# Patient Record
Sex: Male | Born: 1955
Health system: Southern US, Community
[De-identification: ages and names within clinical notes are randomized; demographics above are authoritative.]

## PROBLEM LIST (undated history)

## (undated) DIAGNOSIS — R42 Dizziness and giddiness: Secondary | ICD-10-CM

## (undated) DIAGNOSIS — R55 Syncope and collapse: Secondary | ICD-10-CM

## (undated) DIAGNOSIS — I639 Cerebral infarction, unspecified: Secondary | ICD-10-CM

## (undated) DIAGNOSIS — I82409 Acute embolism and thrombosis of unspecified deep veins of unspecified lower extremity: Secondary | ICD-10-CM

## (undated) DIAGNOSIS — I4892 Unspecified atrial flutter: Secondary | ICD-10-CM

## (undated) DIAGNOSIS — I480 Paroxysmal atrial fibrillation: Secondary | ICD-10-CM

## (undated) HISTORY — DX: Acute embolism and thrombosis of unspecified deep veins of unspecified lower extremity: I82.409

## (undated) HISTORY — DX: Cerebral infarction, unspecified: I63.9

## (undated) HISTORY — PX: OTHER SURGICAL HISTORY: SHX169

## (undated) HISTORY — DX: Unspecified atrial flutter: I48.92

## (undated) HISTORY — DX: Paroxysmal atrial fibrillation: I48.0

## (undated) HISTORY — DX: Dizziness and giddiness: R42

## (undated) HISTORY — DX: Syncope and collapse: R55

## (undated) HISTORY — PX: WRIST SURGERY: SHX841

## (undated) HISTORY — PX: RETINAL DETACHMENT SURGERY: SHX105

---

## 2003-05-12 ENCOUNTER — Ambulatory Visit (HOSPITAL_BASED_OUTPATIENT_CLINIC_OR_DEPARTMENT_OTHER): Admission: RE | Admit: 2003-05-12 | Discharge: 2003-05-12 | Payer: Self-pay | Admitting: Plastic Surgery

## 2003-05-12 ENCOUNTER — Ambulatory Visit (HOSPITAL_COMMUNITY): Admission: RE | Admit: 2003-05-12 | Discharge: 2003-05-12 | Payer: Self-pay | Admitting: Plastic Surgery

## 2003-05-13 ENCOUNTER — Encounter (INDEPENDENT_AMBULATORY_CARE_PROVIDER_SITE_OTHER): Payer: Self-pay | Admitting: *Deleted

## 2006-11-17 ENCOUNTER — Ambulatory Visit: Payer: Self-pay | Admitting: Cardiovascular Disease

## 2007-11-09 ENCOUNTER — Encounter (INDEPENDENT_AMBULATORY_CARE_PROVIDER_SITE_OTHER): Payer: Self-pay | Admitting: Gastroenterology

## 2007-11-09 ENCOUNTER — Ambulatory Visit (HOSPITAL_COMMUNITY): Admission: RE | Admit: 2007-11-09 | Discharge: 2007-11-09 | Payer: Self-pay | Admitting: Gastroenterology

## 2008-05-10 ENCOUNTER — Encounter (INDEPENDENT_AMBULATORY_CARE_PROVIDER_SITE_OTHER): Payer: Self-pay | Admitting: Gastroenterology

## 2008-05-10 ENCOUNTER — Ambulatory Visit (HOSPITAL_COMMUNITY): Admission: RE | Admit: 2008-05-10 | Discharge: 2008-05-10 | Payer: Self-pay | Admitting: Gastroenterology

## 2009-02-04 ENCOUNTER — Emergency Department (HOSPITAL_COMMUNITY): Admission: EM | Admit: 2009-02-04 | Discharge: 2009-02-04 | Payer: Self-pay | Admitting: Emergency Medicine

## 2009-02-07 ENCOUNTER — Encounter (INDEPENDENT_AMBULATORY_CARE_PROVIDER_SITE_OTHER): Payer: Self-pay | Admitting: *Deleted

## 2010-04-24 ENCOUNTER — Ambulatory Visit (HOSPITAL_COMMUNITY): Admission: RE | Admit: 2010-04-24 | Discharge: 2010-04-24 | Payer: Self-pay | Admitting: Gastroenterology

## 2010-07-31 NOTE — Letter (Signed)
Summary: Recall Colonoscopy Date Change Letter  [facility]  [address] [place], [place] [address]   Phone: [phone]  Fax: [phone]      February 07, 2009 MRN: [id number]   ASHRAF MESTA [address] [facility] [address] [place], [place]  [address]   Dear Mr. Blais,   Previously you were recommended to have a repeat colonoscopy around this time. Your chart was recently reviewed by Dr. Judie Petit T. Pleas Koch, M.D. of [facility]. Follow up colonoscopy is now recommended in 03-01-2012. This revised recommendation is based on current, nationally recognized guidelines for colorectal cancer screening and polyp surveillance. These guidelines are endorsed by the American Cancer Society, [employer] on Colorectal Cancer as well as numerous other major medical organizations.  Please understand that our recommendation assumes that you do not have any new symptoms such as bleeding, a change in bowel habits, anemia, or significant abdominal discomfort. If you do have any concerning GI symptoms or want to discuss the guideline recommendations, please call to arrange an office visit at your earliest convenience. Otherwise we will keep you in our reminder system and contact you 1-2 months prior to the date listed above to schedule your next colonoscopy.  Thank you,  Judie Petit T. Pleas Koch, M.D.  [facility] [phone]

## 2010-11-13 ENCOUNTER — Other Ambulatory Visit: Payer: Self-pay | Admitting: Podiatry

## 2010-11-13 NOTE — Op Note (Signed)
NAMEJAMICHEAL, Walter [name]               ACCOUNT NO.:  [id number]   MEDICAL RECORD NO.:  [id number]          PATIENT TYPE:  AMB   LOCATION:  ENDO                         FACILITY:  [facility]   PHYSICIAN:  Petra Kuba, M.D.    DATE OF BIRTH:  28-Nov-1955   DATE OF PROCEDURE:  11/09/2007  DATE OF DISCHARGE:                               OPERATIVE REPORT   PROCEDURE:  Colonoscopy and polypectomy.   INDICATION:  History of difficult to remove cecal polyp, want to re-  evaluate.  Consent was signed after risks, benefits, methods, and  options thoroughly discussed multiple times in the past.   MEDICINES USED:  1. Fentanyl 125 mcg.  2. Versed 12.5 mg.   PROCEDURE:  Rectal inspection is pertinent for external hemorrhoids  small.  Digital exam was negative.  The video colonoscope was inserted  and easily advanced to the level of the ileocecal valve.  We could see  the cecum at the distance to advance to the cecal pole required rolling  him on his back without any abdominal pressures.  We could not advance  to the cecum with abdominal pressure on his thigh.  On insertion some  left-sided primarily sigmoid diverticula were seen and a small ascending  polyp was also seen on insertion which was snared and hot biopsied at  the base on withdrawal.  The cecal polyp was seen.  The cecum was  identified by the appendiceal orifice and ileocecal valve.  The cecal  polyp was much smaller, but it was still residual.  It was semisessile.  We went ahead and proceeded with multiple snares using both the regular  and the mini snare, and multiple pieces were removed and suctioned  through the scope and collected in the trap.  The area was washed and  suctioned.  There seemed to be some slight residual areas around the  edge and multiple hot biopsies were done, the snares were done on a  setting of 15-150.  The initial hot biopsy was done at 15 and 150, but  with some increased using, we increased it to 20 and 200  for the hot  biopsies.  The ends of the possible residual polypoid areas were hot  biopsied.  All pieces were put in the same container.  After a prolonged  effort, there was no obvious residual polyp seen, and we elected not to  use the argon plasma coagulator due to no obvious residual polyp seen.  There was a tiny cecal polyp that was also cold biopsied x2, and another  small one which was snared, electrocautery applied, and this piece was  suctioned through the scope and collected in the trap.  There was also a  small ileocecal valve polyp which was snared and removed and put in the  same container as was the ascending polyp seen on the above.  After the  area was washed and watched, we elected to slowly withdraw.  No other  polypoid lesions were seen, as we slowly withdrew back to the rectum.  The left-sided diverticula primarily in the sigmoid were confirmed.  Once back  in the rectum, anorectal pull-through and retroflexion was  confirmed.  The scope was straightened, re-advanced short of the left  side of the colon.  Air was suctioned and scope removed.  The patient  tolerated the procedure well.  There was no obvious immediate  complication.   ENDOSCOPIC DIAGNOSES:  1. Internal and external small hemorrhoids.  2. Left-sided diverticula primarily in the sigmoid.  3. Moderate cecal residual polyp status post multiple snares and hot      biopsies as above.  4. Tiny cecal polyp cold biopsy, small cecum, ileocecal valve, and      ascending polyp all snared with additional hot biopsied based on      the ascending polyp.  5. Otherwise within normal limits to the cecum.   PLAN:  Await pathology.  No aspirin, nonsteroidals for 2 weeks, very  slowly advance his diet.  Follow-up p.r.n. and decide future colon  screening 6 months to a year to document complete removal based on  pathology.           ______________________________  Petra Kuba, M.D.     MEM/MEDQ  D:  11/09/2007   T:  11/10/2007  Job:  [id number]   cc:   L. Lupe Carney, M.D.

## 2010-11-13 NOTE — Op Note (Signed)
NAMECARMELLO, [name]               ACCOUNT NO.:  [id number]   MEDICAL RECORD NO.:  [id number]          PATIENT TYPE:  AMB   LOCATION:  ENDO                         FACILITY:  [facility]   PHYSICIAN:  Petra Kuba, M.D.    DATE OF BIRTH:  1955-12-31   DATE OF PROCEDURE:  05/10/2008  DATE OF DISCHARGE:                               OPERATIVE REPORT   PROCEDURE:  Colonoscopy with polypectomy.   INDICATIONS:  Patient with difficult-to-remove cecal polyp.  Want to re-  evaluate.   Consent was signed after risks, benefits, methods, and options  thoroughly discussed multiple times in the past.   MEDICINES USED:  Fentanyl 100 mcg, Versed 10 mg.   PROCEDURE:  Rectal inspection is pertinent for external hemorrhoids,  small.  Digital exam was negative.  The video colonoscope was inserted  and easily advanced around the colon to the cecum.  This did require  rolling him on his back and abdominal pressure.  Other than some left-  sided diverticula, no abnormalities were seen on insertion.  The cecum  was identified by the appendiceal orifice and the ileocecal valve.  We  did advance the scope through the ileocecal valve and then the terminal  ileum, which was normal.  Photo documentation was obtained.  The scope  was slowly withdrawn.  The prep was adequate.  There was some liquid  stool that required washing and suctioning.  The cecum was well  evaluated.  We could see the scar from the previous polypectomy site but  no residual polyp.  We went ahead and took a few cold biopsies in that  area and put it in the first container.  The scope was slowly withdrawn.  On the ileocecal valve, there was questionably a small polyp which was  cold-biopsied x2 and put in a second container.  The scope was slowly  withdrawn.  The risks of the ascending, transverse, and the majority of  the descending was normal.  We did have some left-sided, scattered,  small diverticula.  In the distal sigmoid, a small polyp  was seen, snare  electrocautery applied, and the polyp was removed, suctioned through the  scope and collected in the trap, put in the third container.  The scope  was withdrawn back to the rectum.  Anorectal pull-through and  retroflexion confirmed some small hemorrhoids.  The scope was  straightened and readvanced shortways up the left side of the colon.  Air was suctioned.  The scope removed.  The patient tolerated the  procedure well.  There was no obvious immediate complication.   ENDOSCOPIC DIAGNOSES:  1. Internal/external hemorrhoids.  2. Left-sided, primarily sigmoid diverticula.  3. Distal sigmoid small polyp, snared.  4. Questionable ileocecal valve polyp, cold-biopsied, small.  5. Residual scar but doubt any residual polyp in the cecum, status      post biopsy.  6. Otherwise within normal limits to the terminal ileum.   PLAN:  Await pathology but if there is no residual on the polyp scar,  probably repeat in 2-3 years.  Happy to see back p.r.n.  ______________________________  Petra Kuba, M.D.     MEM/MEDQ  D:  05/10/2008  T:  05/10/2008  Job:  [id number]   cc:   L. Lupe Carney, M.D.  Fax: [phone]

## 2010-11-13 NOTE — Assessment & Plan Note (Signed)
[facility]                            CARDIOLOGY OFFICE NOTE   Walter [name], Walter [name]                      MRN:          [id number]  DATE:11/17/2006                            DOB:          [birth year]    CHIEF COMPLAINT:  Patient here to establish care and would like  preventative cardiology evaluation.   HISTORY OF PRESENT ILLNESS:  Walter [name] is a very nice 55 year old  gentleman who presents for cardiac assessment. He is an active gentleman  with no history of cardiovascular disease. He exercises regularly,  currently 5-6 days weekly. He does vigorous exercise such as running and  weight lifting. He exercises for approximately 1 hour. He specifically  denies any exertional symptoms. He has no chest pain, dyspnea,  orthopnea, PND, edema, palpitations, light-headedness or syncope.  Overall he feels well and has no complaints today. He has no history of  cardiac problems and has never been hospitalized. Specifically, he has  not had hypertension, diabetes or dyslipidemia.   PAST MEDICAL HISTORY:  Pertinent for multiple orthopedic injuries. He  has had a torn Achilles on the left as well as multiple other injuries.  Other than orthopedic surgeries, he has had no other surgeries or  hospitalizations.   MEDICATIONS:  None.   ALLERGIES:  NKDA.   SOCIAL HISTORY:  The patient works in Clinical research associate. He is  married with 2 children. He does not smoke cigarettes or use illicit  drug. He drinks approximately 2-3 alcoholic beverages daily. He  exercises regularly as detailed above.   FAMILY HISTORY:  Pertinent for coronary artery disease in his father. He  just had a coronary stent placed at age 63. There is no history of  premature coronary artery disease in the family.   REVIEW OF SYSTEMS:  A complete 12-point review of systems was performed.  The only pertinent positive to report is that of seasonal allergies. All  other systems were  reviewed and are negative except as detailed above.   PHYSICAL EXAMINATION:  GENERAL:  The patient is alert and oriented. He  is in no acute distress.  VITAL SIGNS:  Weight 220 pounds, blood pressure 126/80 in the right arm,  127/77 in the left arm. Heart rate 71, height 6 feet, respirations 12.  HEENT:  Normal.  NECK:  Normal carotid upstrokes without bruits. Jugular venous pressure  is normal, no thyromegaly or thyroid nodules.  LUNGS:  Clear to auscultation bilaterally.  HEART:  PMI is discreet and nondisplaced. There is no RV heave or lift.  The heart is a regular rate and rhythm without murmurs or gallops.  ABDOMEN:  Soft, nontender, no organomegaly, no abdominal bruits.  EXTREMITIES:  No clubbing, cyanosis or edema. There are no femoral  artery bruits. Peripheral pulses are 2+ and equal throughout.  NEUROLOGIC:  Cranial nerves II-XII are intact. Strength is 5/5 and equal  in the arms and leg.  SKIN:  Warm and dry.   EKG shows normal sinus rhythm and is within normal limits. The  ventricular rate is 71 beats per minute.   ASSESSMENT:  Mr. Mansouri  is a 55 year old gentleman with no evidence of  cardiovascular disease. He has no traditional cardiac risk factors and  he has a normal EKG. I have reviewed his lipid panel which was done less  than one year ago which shows a total cholesterol of 192 and HDL  cholesterol of 57 and a triglyceride level of 97. His cholesterol/HDL  ratio is 3.4. He is really at very low risk of any cardiovascular  problems at this point. His cholesterol and blood pressure are both in  the ideal range. He has no family history of premature coronary artery  disease and diabetes is not present. I encouraged him to continue his  excellent exercise regimen with no other recommendations regarding  medication or other therapies. He does not require any screening at this  point. I would like to see him back in a few years just to make sure  that his blood pressure  is still under optimal control and his lipids  are still in line. If he develops any problems, I would be happy to see  him in the future as needed.     Veverly Fells. Excell Seltzer, MD  Electronically Signed    MDC/MedQ  DD: 11/17/2006  DT: 11/18/2006  Job #: [id number]   cc:   L. Lupe Carney, M.D.

## 2010-11-16 NOTE — Op Note (Signed)
   NAMEZAYAAN, KOZAK                         ACCOUNT NO.:  [id number]   MEDICAL RECORD NO.:  [id number]                   PATIENT TYPE:  AMB   LOCATION:  DSC                                  FACILITY:  [facility]   PHYSICIAN:  Alfredia Ferguson, M.D.               DATE OF BIRTH:  12/05/1955   DATE OF PROCEDURE:  05/12/2003  DATE OF DISCHARGE:                                 OPERATIVE REPORT   PREOPERATIVE DIAGNOSIS:  6 mm hemangioma, mid upper lip, at wet-dry  vermilion border.   POSTOPERATIVE DIAGNOSIS:  6 mm hemangioma, mid upper lip, at wet-dry  vermilion border.   OPERATION PERFORMED:  Excision of hemangioma, mid upper lip.   SURGEON:  Alfredia Ferguson, M.D.   ANESTHESIA:  2% Xylocaine, 1:100,000 epinephrine.   INDICATIONS FOR SURGERY:  This is a 55 year old male with a slowly enlarging  blue subcutaneous nodule.  Examination reveals that it is a hemangioma.  The  patient wishes to have it removed.  He understands that he is trading what  he has for a permanent and potentially visible scar.  In spite of that, he  wishes to proceed.   DESCRIPTION OF SURGERY:  Elliptical skin mark was placed around the  hemangioma.  Local anesthesia was infiltrated.  The area was prepped with  Betadine.  After waiting approximately 10 minutes, a small elliptical  excision directly over the hemangioma was made.  The hemangioma was  visualized and dissected away from the surrounding tissue.  Specimen was  passed off for pathology.  The edges were closed with interrupted 5-0  chromic suture.  The patient tolerated the procedure well and was discharged  to home in satisfactory condition.                                               Alfredia Ferguson, M.D.    WBB/MEDQ  D:  05/12/2003  T:  05/13/2003  Job:  [id number]

## 2013-08-03 ENCOUNTER — Other Ambulatory Visit: Payer: Self-pay | Admitting: Gastroenterology

## 2013-09-25 ENCOUNTER — Inpatient Hospital Stay (HOSPITAL_COMMUNITY)
Admission: EM | Admit: 2013-09-25 | Discharge: 2013-09-27 | DRG: 310 | Disposition: A | Discharge status: Home / Self Care | Payer: BC Managed Care – PPO | Attending: Internal Medicine | Admitting: Internal Medicine

## 2013-09-25 ENCOUNTER — Emergency Department (HOSPITAL_COMMUNITY): Payer: BC Managed Care – PPO

## 2013-09-25 ENCOUNTER — Encounter (HOSPITAL_COMMUNITY): Payer: Self-pay | Admitting: Emergency Medicine

## 2013-09-25 DIAGNOSIS — I4892 Unspecified atrial flutter: Principal | ICD-10-CM | POA: Diagnosis present

## 2013-09-25 DIAGNOSIS — R000 Tachycardia, unspecified: Secondary | ICD-10-CM | POA: Diagnosis present

## 2013-09-25 DIAGNOSIS — I429 Cardiomyopathy, unspecified: Secondary | ICD-10-CM

## 2013-09-25 DIAGNOSIS — R42 Dizziness and giddiness: Secondary | ICD-10-CM

## 2013-09-25 DIAGNOSIS — I4891 Unspecified atrial fibrillation: Secondary | ICD-10-CM | POA: Diagnosis present

## 2013-09-25 DIAGNOSIS — I43 Cardiomyopathy in diseases classified elsewhere: Secondary | ICD-10-CM | POA: Diagnosis present

## 2013-09-25 DIAGNOSIS — R55 Syncope and collapse: Secondary | ICD-10-CM | POA: Diagnosis present

## 2013-09-25 HISTORY — DX: Syncope and collapse: R55

## 2013-09-25 HISTORY — DX: Dizziness and giddiness: R42

## 2013-09-25 LAB — PROTIME-INR
INR: 0.98 (ref 0.00–1.49)
Prothrombin Time: 12.8 seconds (ref 11.6–15.2)

## 2013-09-25 LAB — CBC WITH DIFFERENTIAL/PLATELET
Basophils Absolute: 0 10*3/uL (ref 0.0–0.1)
Basophils Relative: 0 % (ref 0–1)
Eosinophils Absolute: 0.1 10*3/uL (ref 0.0–0.7)
Eosinophils Relative: 2 % (ref 0–5)
HCT: 47.6 % (ref 39.0–52.0)
Hemoglobin: 17.3 g/dL — ABNORMAL HIGH (ref 13.0–17.0)
Lymphocytes Relative: 36 % (ref 12–46)
Lymphs Abs: 2 10*3/uL (ref 0.7–4.0)
MCH: 34 pg (ref 26.0–34.0)
MCHC: 36.3 g/dL — ABNORMAL HIGH (ref 30.0–36.0)
MCV: 93.5 fL (ref 78.0–100.0)
Monocytes Absolute: 0.4 10*3/uL (ref 0.1–1.0)
Monocytes Relative: 8 % (ref 3–12)
Neutro Abs: 3 10*3/uL (ref 1.7–7.7)
Neutrophils Relative %: 54 % (ref 43–77)
Platelets: 216 10*3/uL (ref 150–400)
RBC: 5.09 MIL/uL (ref 4.22–5.81)
RDW: 13 % (ref 11.5–15.5)
WBC: 5.5 10*3/uL (ref 4.0–10.5)

## 2013-09-25 LAB — TROPONIN I
Troponin I: <0.3 ng/mL (ref <0.30)
Troponin I: <0.3 ng/mL (ref <0.30)

## 2013-09-25 LAB — COMPREHENSIVE METABOLIC PANEL
ALT: 17 U/L (ref 0–53)
AST: 18 U/L (ref 0–37)
Albumin: 4.1 g/dL (ref 3.5–5.2)
Alkaline Phosphatase: 56 U/L (ref 39–117)
BUN: 17 mg/dL (ref 6–23)
CO2: 27 mEq/L (ref 19–32)
Calcium: 9.9 mg/dL (ref 8.4–10.5)
Chloride: 100 mEq/L (ref 96–112)
Creatinine, Ser: 1.09 mg/dL (ref 0.50–1.35)
GFR calc Af Amer: 85 mL/min — ABNORMAL LOW (ref >90)
GFR calc non Af Amer: 73 mL/min — ABNORMAL LOW (ref >90)
Glucose, Bld: 102 mg/dL — ABNORMAL HIGH (ref 70–99)
Potassium: 4.9 mEq/L (ref 3.7–5.3)
Sodium: 140 mEq/L (ref 137–147)
Total Bilirubin: 0.9 mg/dL (ref 0.3–1.2)
Total Protein: 7 g/dL (ref 6.0–8.3)

## 2013-09-25 LAB — APTT: aPTT: 29 seconds (ref 24–37)

## 2013-09-25 LAB — I-STAT TROPONIN, ED: Troponin i, poc: 0.01 ng/mL (ref 0.00–0.08)

## 2013-09-25 LAB — MAGNESIUM: Magnesium: 1.8 mg/dL (ref 1.5–2.5)

## 2013-09-25 MED ORDER — HEPARIN (PORCINE) IN NACL 100-0.45 UNIT/ML-% IJ SOLN
1600.0000 [IU]/h | INTRAMUSCULAR | Status: DC
  Administered 2013-09-25: 1450 [IU]/h via INTRAVENOUS
  Administered 2013-09-26 (×2): 1600 [IU]/h via INTRAVENOUS
  Filled 2013-09-25 (×5): qty 250

## 2013-09-25 MED ORDER — DILTIAZEM HCL 30 MG PO TABS
30.0000 mg | ORAL_TABLET | Freq: Once | ORAL | Status: AC
  Administered 2013-09-25: 30 mg via ORAL
  Filled 2013-09-25: qty 1

## 2013-09-25 MED ORDER — ASPIRIN EC 81 MG PO TBEC
81.0000 mg | DELAYED_RELEASE_TABLET | Freq: Every day | ORAL | Status: DC
  Administered 2013-09-26 – 2013-09-27 (×2): 81 mg via ORAL
  Filled 2013-09-25 (×3): qty 1

## 2013-09-25 MED ORDER — HEPARIN BOLUS VIA INFUSION
4000.0000 [IU] | Freq: Once | INTRAVENOUS | Status: AC
  Administered 2013-09-25: 4000 [IU] via INTRAVENOUS
  Filled 2013-09-25: qty 4000

## 2013-09-25 MED ORDER — ADULT MULTIVITAMIN W/MINERALS CH
1.0000 | ORAL_TABLET | Freq: Every day | ORAL | Status: DC
  Administered 2013-09-26 – 2013-09-27 (×2): 1 via ORAL
  Filled 2013-09-25 (×3): qty 1

## 2013-09-25 MED ORDER — DILTIAZEM HCL 30 MG PO TABS
30.0000 mg | ORAL_TABLET | Freq: Four times a day (QID) | ORAL | Status: DC
  Administered 2013-09-25 – 2013-09-26 (×3): 30 mg via ORAL
  Filled 2013-09-25 (×7): qty 1

## 2013-09-25 MED ORDER — DILTIAZEM HCL 30 MG PO TABS
30.0000 mg | ORAL_TABLET | Freq: Once | ORAL | Status: DC
  Filled 2013-09-25: qty 1

## 2013-09-25 MED ORDER — SODIUM CHLORIDE 0.9 % IV SOLN
INTRAVENOUS | Status: DC
  Administered 2013-09-25: 18:00:00 via INTRAVENOUS

## 2013-09-25 MED ORDER — SODIUM CHLORIDE 0.9 % IV BOLUS (SEPSIS)
1000.0000 mL | Freq: Once | INTRAVENOUS | Status: AC
  Administered 2013-09-25: 1000 mL via INTRAVENOUS

## 2013-09-25 NOTE — ED Notes (Addendum)
Per [facility], pt was at the gym and was on the treadmill started not feeling well and feeling SOB. Got off and checked his heart rate and was 195. Checked it on another machine and was the same. Friend cardiologist was there and listened to him and told him to go to the hospital. Pt went to [facility]. Pt in A[place].  [facility] gave him ASA.

## 2013-09-25 NOTE — ED Notes (Signed)
Phlebotomy at the bedside  

## 2013-09-25 NOTE — H&P (Signed)
Walter [name] is an 58 y.o. male.    Primary Cardiologist::  [facility]  DJS:HFWYOVZC,HYIF, MD  Chief Complaint: fast HR HPI:  58 year old WMM in usual good health until last pm he felt dizzy and everything got dark, he would have passed out if he had not been able to sit.  Then he felt well until today.  He was on treadmill and just no energy, ran 1 mile but on second mile he had to walk, he took pulse with machine and pulse 195.  He then walked in gym for a while, and tried another machine and again pulse up to 185.  Dr. Lia Foyer in [place] and listened to his heart and diagnosed atrial fib.  Pt then went to urgent care and was sent by EMS to [facility].  Here in a flutter with RVR at times.  He is found to be orthostatic.  1 liter of fluid given.  BP still drops with standing and HR increases.  Pt has been given po cardizem.  Pt without chest pain, no SOB.  Just fatigue.   Labs stable,  Troponin neg.  Inf ST appears elevated due to a flutter beats.     History reviewed. No pertinent past medical history.  Past Surgical History  Procedure Laterality Date  . Retinal detachment surgery    . Wrist surgery      History reviewed. No pertinent family history. Social History:  reports that he has never smoked. He does not have any smokeless tobacco history on file. He reports that he drinks alcohol. He reports that he does not use illicit drugs.  Allergies: No Known Allergies  OUTPATIENT MEDICATIONS: Multivitamin for over 50, packet Probiotic acidophilus Lamisil 250 mg just finished.   Results for orders placed during the hospital encounter of 09/25/13 (from the past 48 hour(s))  CBC WITH DIFFERENTIAL     Status: Abnormal   Collection Time    09/25/13 12:24 PM      Result Value Ref Range   WBC 5.5  4.0 - 10.5 K/uL   RBC 5.09  4.22 - 5.81 MIL/uL   Hemoglobin 17.3 (*) 13.0 - 17.0 g/dL   HCT 47.6  39.0 - 52.0 %   MCV 93.5  78.0 - 100.0 fL   MCH 34.0  26.0 - 34.0 pg   MCHC 36.3 (*)  30.0 - 36.0 g/dL   RDW 13.0  11.5 - 15.5 %   Platelets 216  150 - 400 K/uL   Neutrophils Relative % 54  43 - 77 %   Neutro Abs 3.0  1.7 - 7.7 K/uL   Lymphocytes Relative 36  12 - 46 %   Lymphs Abs 2.0  0.7 - 4.0 K/uL   Monocytes Relative 8  3 - 12 %   Monocytes Absolute 0.4  0.1 - 1.0 K/uL   Eosinophils Relative 2  0 - 5 %   Eosinophils Absolute 0.1  0.0 - 0.7 K/uL   Basophils Relative 0  0 - 1 %   Basophils Absolute 0.0  0.0 - 0.1 K/uL  COMPREHENSIVE METABOLIC PANEL     Status: Abnormal   Collection Time    09/25/13 12:24 PM      Result Value Ref Range   Sodium 140  137 - 147 mEq/L   Potassium 4.9  3.7 - 5.3 mEq/L   Chloride 100  96 - 112 mEq/L   CO2 27  19 - 32 mEq/L  Glucose, Bld 102 (*) 70 - 99 mg/dL   BUN 17  6 - 23 mg/dL   Creatinine, Ser 1.09  0.50 - 1.35 mg/dL   Calcium 9.9  8.4 - 10.5 mg/dL   Total Protein 7.0  6.0 - 8.3 g/dL   Albumin 4.1  3.5 - 5.2 g/dL   AST 18  0 - 37 U/L   ALT 17  0 - 53 U/L   Alkaline Phosphatase 56  39 - 117 U/L   Total Bilirubin 0.9  0.3 - 1.2 mg/dL   GFR calc non Af Amer 73 (*) >90 mL/min   GFR calc Af Amer 85 (*) >90 mL/min   Comment: (NOTE)     The eGFR has been calculated using the CKD EPI equation.     This calculation has not been validated in all clinical situations.     eGFR's persistently <90 mL/min signify possible Chronic Kidney     Disease.  Randolm Idol, ED     Status: None   Collection Time    09/25/13 12:27 PM      Result Value Ref Range   Troponin i, poc 0.01  0.00 - 0.08 ng/mL   Comment 3            Comment: Due to the release kinetics of cTnI,     a negative result within the first hours     of the onset of symptoms does not rule out     myocardial infarction with certainty.     If myocardial infarction is still suspected,     repeat the test at appropriate intervals.   Dg Chest Portable 1 View  09/25/2013   CLINICAL DATA:  Chest pain and tachycardia.  EXAM: PORTABLE CHEST - 1 VIEW  COMPARISON:  None.   FINDINGS: The heart size and mediastinal contours are within normal limits. There is no evidence of pulmonary edema, consolidation, pneumothorax, nodule or pleural fluid. The visualized skeletal structures are unremarkable.  IMPRESSION: No active disease.   Electronically Signed   By: Aletta Edouard M.D.   On: 09/25/2013 12:54    ROS: General:no colds or fevers, no weight changes Skin:no rashes or ulcers HEENT:no blurred vision, no congestion CV:see HPI PUL:see HPI GI:no diarrhea constipation or melena, no indigestion GU:no hematuria, no dysuria MS:no joint pain, no claudication Neuro:near syncope, + lightheadedness both last evening Endo:no diabetes, no thyroid disease   Blood pressure 101/85, pulse 123, temperature 98 F (36.7 C), temperature source Oral, resp. rate 23, SpO2 99.00%. PE: General:Pleasant affect, NAD Skin:Warm and dry, brisk capillary refill HEENT:normocephalic, sclera clear, mucus membranes moist Neck:supple, no JVD, no bruits  Heart:irreg irreg without murmur, gallup, rub or click Lungs:clear without rales, rhonchi, or wheezes WJX:BJYN, non tender, + BS, do not palpate liver spleen or masses Ext:no lower ext edema, 2+ pedal pulses, 2+ radial pulses Neuro:alert and oriented, MAE, follows commands, + facial symmetry    Assessment/Plan Principal Problem: 1.    Atrial flutter, rule out ischemic source check troponin, echo, IV heparin, plan TEE DCCV for Monday.  Stress test as outpt.  Active Problems: 2.   Near syncope    [facility] R Nurse Practitioner Certified [place] Pager [phone] or after 5pm or weekends call [phone] 09/25/2013, 2:23 PM

## 2013-09-25 NOTE — ED Notes (Signed)
Dr. Rennis Golden at the bedside.

## 2013-09-25 NOTE — ED Notes (Signed)
Vernona Rieger, cardiology NP at the bedside.

## 2013-09-25 NOTE — ED Notes (Signed)
Pt returns from xray, no issues at radiology placed back on monitor.

## 2013-09-25 NOTE — ED Provider Notes (Signed)
CSN: [id number]     Arrival date & time 09/25/13  1206 History   First MD Initiated Contact with Patient 09/25/13 1215     Chief Complaint  Patient presents with  . Atrial Fibrillation     (Consider location/radiation/quality/duration/timing/severity/associated sxs/prior Treatment) The history is provided by the patient.  Lynann BeaverSteven L Hovater is a 58 y.o. male otherwise healthy here with palpitations. He was working out on the treadmill today and suddenly noticed that his heart rate was in the 190s. He then rechecked it and is still elevated. Had some dizziness but no syncope or chest pain. No history of blood clots and no recent travel. He was told to go to urgent care and had EKG done that showed atrial flutter and was sent here for evaluation.    History reviewed. No pertinent past medical history. Past Surgical History  Procedure Laterality Date  . Retinal detachment surgery    . Wrist surgery     Family History  Problem Relation Age of Onset  . Heart disease Mother   . Heart disease Father   . Healthy Sister   . Healthy Brother    History  Substance Use Topics  . Smoking status: Never Smoker   . Smokeless tobacco: Never Used  . Alcohol Use: Yes     Comment: occas    Review of Systems  Cardiovascular: Positive for palpitations.  All other systems reviewed and are negative.      Allergies  Review of patient's allergies indicates no known allergies.  Home Medications   Current Outpatient Rx  Name  Route  Sig  Dispense  Refill  . Multiple Vitamin (MULTIVITAMIN WITH MINERALS) TABS tablet   Oral   Take 1 tablet by mouth daily. [employer] over 50 vitamin packet         . Probiotic Product (PROBIOTIC ACIDOPHILUS BEADS PO)   Oral   Take 1 capsule by mouth daily.         [name]. terbinafine (LAMISIL) 250 MG tablet   Oral   Take 250 mg by mouth daily. 7 day prescription          BP 101/85  Pulse 123  Temp(Src) 98 F (36.7 C) (Oral)  Resp 23  SpO2 99% Physical Exam   Nursing note and vitals reviewed. Constitutional: He is oriented to person, place, and time. He appears well-developed and well-nourished.  HENT:  Head: Normocephalic.  Mouth/Throat: Oropharynx is clear and moist.  Eyes: Conjunctivae and EOM are normal. Pupils are equal, round, and reactive to light.  Neck: Normal range of motion. Neck supple.  Cardiovascular: Normal rate and normal heart sounds.   Irregular   Pulmonary/Chest: Effort normal and breath sounds normal. No respiratory distress. He has no wheezes. He has no rales.  Abdominal: Soft. Bowel sounds are normal. He exhibits no distension. There is no tenderness. There is no rebound.  Musculoskeletal: Normal range of motion. He exhibits no edema and no tenderness.  Neurological: He is alert and oriented to person, place, and time. No cranial nerve deficit. Coordination normal.  Skin: Skin is warm and dry.  Psychiatric: He has a normal mood and affect. His behavior is normal. Judgment and thought content normal.    ED Course  Procedures (including critical care time) Labs Review Labs Reviewed  CBC WITH DIFFERENTIAL - Abnormal; Notable for the following:    Hemoglobin 17.3 (*)    MCHC 36.3 (*)    All other components within normal limits  COMPREHENSIVE METABOLIC PANEL -  Abnormal; Notable for the following:    Glucose, Bld 102 (*)    GFR calc non Af Amer 73 (*)    GFR calc Af Amer 85 (*)    All other components within normal limits  I-STAT TROPOININ, ED   Imaging Review Dg Chest Portable 1 View  09/25/2013   CLINICAL DATA:  Chest pain and tachycardia.  EXAM: PORTABLE CHEST - 1 VIEW  COMPARISON:  None.  FINDINGS: The heart size and mediastinal contours are within normal limits. There is no evidence of pulmonary edema, consolidation, pneumothorax, nodule or pleural fluid. The visualized skeletal structures are unremarkable.  IMPRESSION: No active disease.   Electronically Signed   By: Irish Lack M.D.   On: 09/25/2013 12:54      EKG Interpretation   Date/Time:  Saturday September 25 2013 12:16:36 EDT Ventricular Rate:  92 PR Interval:    QRS Duration: 89 QT Interval:  391 QTC Calculation: 484 R Axis:   10 Text Interpretation:  Atrial flutter with predominant 3:1 AV block ST  elevation, consider inferior injury Borderline prolonged QT interval No  previous ECGs available Confirmed by Vishwa Dais  MD, Bassem Bernasconi ([id number]) on 09/25/2013  12:27:27 PM      MDM   Final diagnoses:  None   JOHNANTHONY LAPITAN is a 58 y.o. male here with new onset aflutter. HR in the 90s. Not concerned for PE. No signs of CHF. CHAD2 score is 0. Will check labs, cxr. Will give cardizem. He doesn't need to be anticoagulated right now.   2:39 PM Patient is not tachy when sitting. However, when he stands up, he gets tachycardic and hypotensive. Given 2L NS, given cardizem PO. Cardiology will admit for possible cardioversion.     Richardean Canal, MD 09/25/13 1440

## 2013-09-25 NOTE — H&P (Signed)
Pt. Seen and examined. Agree with the NP/PA-C note as written.  Pleasant 58 yo male with no significant past medical history.  He was feeling unwell last night, somewhat dizzy and pre-syncopal.  He went for his morning workout today and was noting that his pulse was very rapid. He ran into Dr. Riley Kill at the gym who listened to him and noted he was out of rhythm.  He went to urgent care for an EKG and was sent to the ER for possible STEMI. EKG shows atrial flutter with variable response - J point elevation anteriorly. He denies any chest pain at all - just awareness of palpitations. He was given cardizem in the ER and is now rate-controlled. He (and wife) deny sleep apnea symptoms. No recent infection. Just finished course of Lamasil for toe fungus.  Impression: 1.  New onset atrial flutter 2.  Near syncope  Plan: 1. Admit for rate-control.  R/o ACS with troponin. 2. Use po cardizem for rate control (30 q6) - IV heparin for anticoagulation. 3. Exact onset is not clear, however, seems to be <48 hrs - he was near syncopal and would benefit from     echo - the TEE-guided approach is the best way to exclude cardiac thrombus. Would plan on this Monday     if he does not spontaneously convert by then. 4. Will likely need outpatient stress test if he rules-out. 5. ?if lamasil could have caused this - I'm not aware of literature on this. 6.  Probably recommend NOAC on discharge for 1-3 months, then could convert to aspirin based on      CHADSVASC score of 0.  Chrystie Nose, MD, [facility] Attending Cardiologist [facility]

## 2013-09-25 NOTE — Progress Notes (Signed)
ANTICOAGULATION CONSULT NOTE - Initial Consult  Pharmacy Consult for heparin Indication: atrial fibrillation  No Known Allergies  Patient Measurements:   Heparin Dosing Weight: 95  Vital Signs: Temp: 98 F (36.7 C) (03/28 1219) Temp src: Oral (03/28 1219) BP: 125/84 mmHg (03/28 1545) Pulse Rate: 81 (03/28 1545)  Labs:  Recent Labs  09/25/13 1224  HGB 17.3*  HCT 47.6  PLT 216  CREATININE 1.09    CrCl is unknown because there is no height on file for the current visit.   Medical History: History reviewed. No pertinent past medical history.  Medications:  Prescriptions prior to admission  Medication Sig Dispense Refill  . Multiple Vitamin (MULTIVITAMIN WITH MINERALS) TABS tablet Take 1 tablet by mouth daily. [employer] over 50 vitamin packet      . Probiotic Product (PROBIOTIC ACIDOPHILUS BEADS PO) Take 1 capsule by mouth daily.      [name] terbinafine (LAMISIL) 250 MG tablet Take 250 mg by mouth daily. 7 day prescription       Scheduled:  . aspirin EC  81 mg Oral Daily  . diltiazem  30 mg Oral 4 times per day  . multivitamin with minerals  1 tablet Oral Daily   Infusions:  . sodium chloride      Assessment: 58 yo who was admitted for new onset aflutter. IV heparin has been ordered for anticoagulation.   Goal of Therapy:  Heparin level 0.3-0.7 units/ml Monitor platelets by anticoagulation protocol: Yes   Plan:   Heparin bolus 4000 units x1 Heparin drip at 1450 units/hr F/u with 6 hr heparin level Daily level and CBC

## 2013-09-25 NOTE — ED Notes (Signed)
Dr Riley Kill, friend, at the bedside.

## 2013-09-26 LAB — HEPATIC FUNCTION PANEL
ALT: 16 U/L (ref 0–53)
AST: 17 U/L (ref 0–37)
Albumin: 3.6 g/dL (ref 3.5–5.2)
Alkaline Phosphatase: 45 U/L (ref 39–117)
Bilirubin, Direct: <0.2 mg/dL (ref 0.0–0.3)
Total Bilirubin: 1 mg/dL (ref 0.3–1.2)
Total Protein: 6 g/dL (ref 6.0–8.3)

## 2013-09-26 LAB — HEMOGLOBIN A1C
Hgb A1c MFr Bld: 5.7 % — ABNORMAL HIGH (ref <5.7)
Mean Plasma Glucose: 117 mg/dL — ABNORMAL HIGH (ref <117)

## 2013-09-26 LAB — CBC
HCT: 45.7 % (ref 39.0–52.0)
Hemoglobin: 16 g/dL (ref 13.0–17.0)
MCH: 33.1 pg (ref 26.0–34.0)
MCHC: 35 g/dL (ref 30.0–36.0)
MCV: 94.6 fL (ref 78.0–100.0)
Platelets: 197 10*3/uL (ref 150–400)
RBC: 4.83 MIL/uL (ref 4.22–5.81)
RDW: 13.2 % (ref 11.5–15.5)
WBC: 6.1 10*3/uL (ref 4.0–10.5)

## 2013-09-26 LAB — HEPARIN LEVEL (UNFRACTIONATED)
Heparin Unfractionated: 0.29 IU/mL — ABNORMAL LOW (ref 0.30–0.70)
Heparin Unfractionated: 0.38 IU/mL (ref 0.30–0.70)
Heparin Unfractionated: 0.41 IU/mL (ref 0.30–0.70)

## 2013-09-26 LAB — LIPID PANEL
Cholesterol: 169 mg/dL (ref 0–200)
HDL: 55 mg/dL (ref >39)
LDL Cholesterol: 83 mg/dL (ref 0–99)
Total CHOL/HDL Ratio: 3.1 RATIO
Triglycerides: 157 mg/dL — ABNORMAL HIGH (ref <150)
VLDL: 31 mg/dL (ref 0–40)

## 2013-09-26 LAB — BASIC METABOLIC PANEL
BUN: 13 mg/dL (ref 6–23)
CO2: 26 mEq/L (ref 19–32)
Calcium: 8.8 mg/dL (ref 8.4–10.5)
Chloride: 99 mEq/L (ref 96–112)
Creatinine, Ser: 1.1 mg/dL (ref 0.50–1.35)
GFR calc Af Amer: 84 mL/min — ABNORMAL LOW (ref >90)
GFR calc non Af Amer: 72 mL/min — ABNORMAL LOW (ref >90)
Glucose, Bld: 100 mg/dL — ABNORMAL HIGH (ref 70–99)
Potassium: 4.1 mEq/L (ref 3.7–5.3)
Sodium: 136 mEq/L — ABNORMAL LOW (ref 137–147)

## 2013-09-26 LAB — TROPONIN I: Troponin I: <0.3 ng/mL (ref <0.30)

## 2013-09-26 LAB — T4, FREE: Free T4: 1.09 ng/dL (ref 0.80–1.80)

## 2013-09-26 LAB — TSH: TSH: 2.921 u[IU]/mL (ref 0.350–4.500)

## 2013-09-26 MED ORDER — METOPROLOL SUCCINATE ER 25 MG PO TB24
25.0000 mg | ORAL_TABLET | Freq: Every day | ORAL | Status: DC
  Administered 2013-09-26: 25 mg via ORAL
  Filled 2013-09-26 (×2): qty 1

## 2013-09-26 MED ORDER — OFF THE BEAT BOOK
Freq: Once | Status: AC
  Administered 2013-09-26: 02:00:00
  Filled 2013-09-26: qty 1

## 2013-09-26 NOTE — Progress Notes (Addendum)
ANTICOAGULATION CONSULT NOTE - Follow Up Consult  Pharmacy Consult for heparin Indication: atrial flutter  No Known Allergies  Patient Measurements: Height: 5\' 11"  (180.3 cm) Weight: 211 lb 4.8 oz (95.845 kg) IBW/kg (Calculated) : 75.3 Heparin Dosing Weight: 94.6 kg  Vital Signs: Temp: 98 F (36.7 C) (03/29 0509) Temp src: Oral (03/29 0509) BP: 110/80 mmHg (03/29 0741) Pulse Rate: 67 (03/29 0741)  Labs:  Recent Labs  09/25/13 1224 09/25/13 1810 09/25/13 2155 09/26/13 09/26/13 0435 09/26/13 0748  HGB 17.3*  --   --   --  16.0  --   HCT 47.6  --   --   --  45.7  --   PLT 216  --   --   --  197  --   APTT  --  29  --   --   --   --   LABPROT  --  12.8  --   --   --   --   INR  --  0.98  --   --   --   --   HEPARINUNFRC  --   --   --  0.29*  --  0.38  CREATININE 1.09  --   --   --  1.10  --   TROPONINI  --  <0.30 <0.30  --  <0.30  --     Estimated Creatinine Clearance: 86.5 ml/min (by C-G formula based on Cr of 1.1).   Medications:  Infusions:  . sodium chloride 20 mL/hr at 09/25/13 1825  . heparin 1,600 Units/hr (09/26/13 0710)    Assessment: 58 y/o male admitted with new onset aflutter who continues on a heparin drip. Heparin level this morning is therapeutic at 0.38. No bleeding noted, CBC is normal. Plan is for TEE/DCCV tomorrow.  Goal of Therapy:  Heparin level 0.3-0.7 units/ml Monitor platelets by anticoagulation protocol: Yes   Plan:  - Continue heparin drip at 1600 units/hr - Confirmatory heparin level at 14:00 - Daily heparin level and CBC - Monitor for s/sx of bleeding   [facility], [place].D., BCPS Clinical Pharmacist Pager: [phone] 09/26/2013 9:37 AM   Addendum: Confirmatory heparin level is 0.41 on 1600 units/hr. Continue this rate and f/u in am.  Loura Back, Pharm.D., BCPS Clinical Pharmacist Pager: [phone] 09/26/2013 2:38 PM

## 2013-09-26 NOTE — Progress Notes (Signed)
DAILY PROGRESS NOTE  Subjective:  Feels okay. Anxious to get home. Remains in rate-controlled flutter. HR does increase significantly with exertion. ?tachycardia induced cardiomyopathy.  Objective:  Temp:  [97.5 F (36.4 C)-98.2 F (36.8 C)] 98 F (36.7 C) (03/29 0509) Pulse Rate:  [62-123] 67 (03/29 0741) Resp:  [12-24] 16 (03/29 0509) BP: (86-156)/(62-103) 110/80 mmHg (03/29 0741) SpO2:  [96 %-99 %] 98 % (03/29 0509) Weight:  [211 lb 4.8 oz (95.845 kg)] 211 lb 4.8 oz (95.845 kg) (03/28 1700) Weight change:   Intake/Output from previous day: 03/28 0701 - 03/29 0700 In: 2000 [I.V.:2000] Out: 0   Intake/Output from this shift:    Medications: Current Facility-Administered Medications  Medication Dose Route Frequency Provider Last Rate Last Dose  . 0.9 %  sodium chloride infusion   Intravenous Continuous Cecilie Kicks, NP 20 mL/hr at 09/25/13 1825    . aspirin EC tablet 81 mg  81 mg Oral Daily Cecilie Kicks, NP      . diltiazem (CARDIZEM) tablet 30 mg  30 mg Oral 4 times per day Cecilie Kicks, NP   30 mg at 09/26/13 0741  . heparin ADULT infusion 100 units/mL (25000 units/250 mL)  1,600 Units/hr Intravenous Continuous Rogue Bussing, [facility] 16 mL/hr at 09/26/13 0710 1,600 Units/hr at 09/26/13 0710  . multivitamin with minerals tablet 1 tablet  1 tablet Oral Daily Cecilie Kicks, NP        Physical Exam: General appearance: alert and no distress Neck: no carotid bruit and no JVD Lungs: clear to auscultation bilaterally Heart: regular rate and rhythm Abdomen: soft, non-tender; bowel sounds normal; no masses,  no organomegaly Extremities: extremities normal, atraumatic, no cyanosis or edema Pulses: 2+ and symmetric Skin: Skin color, texture, turgor normal. No rashes or lesions Neurologic: Grossly normal .  Lab Results: Results for orders placed during the hospital encounter of 09/25/13 (from the past 48 hour(s))  CBC WITH DIFFERENTIAL     Status: Abnormal   Collection Time    09/25/13 12:24 PM      Result Value Ref Range   WBC 5.5  4.0 - 10.5 K/uL   RBC 5.09  4.22 - 5.81 MIL/uL   Hemoglobin 17.3 (*) 13.0 - 17.0 g/dL   HCT 47.6  39.0 - 52.0 %   MCV 93.5  78.0 - 100.0 fL   MCH 34.0  26.0 - 34.0 pg   MCHC 36.3 (*) 30.0 - 36.0 g/dL   RDW 13.0  11.5 - 15.5 %   Platelets 216  150 - 400 K/uL   Neutrophils Relative % 54  43 - 77 %   Neutro Abs 3.0  1.7 - 7.7 K/uL   Lymphocytes Relative 36  12 - 46 %   Lymphs Abs 2.0  0.7 - 4.0 K/uL   Monocytes Relative 8  3 - 12 %   Monocytes Absolute 0.4  0.1 - 1.0 K/uL   Eosinophils Relative 2  0 - 5 %   Eosinophils Absolute 0.1  0.0 - 0.7 K/uL   Basophils Relative 0  0 - 1 %   Basophils Absolute 0.0  0.0 - 0.1 K/uL  COMPREHENSIVE METABOLIC PANEL     Status: Abnormal   Collection Time    09/25/13 12:24 PM      Result Value Ref Range   Sodium 140  137 - 147 mEq/L   Potassium 4.9  3.7 - 5.3 mEq/L   Chloride 100  96 - 112 mEq/L   CO2 27  19 - 32 mEq/L   Glucose, Bld 102 (*) 70 - 99 mg/dL   BUN 17  6 - 23 mg/dL   Creatinine, Ser 1.09  0.50 - 1.35 mg/dL   Calcium 9.9  8.4 - 10.5 mg/dL   Total Protein 7.0  6.0 - 8.3 g/dL   Albumin 4.1  3.5 - 5.2 g/dL   AST 18  0 - 37 U/L   ALT 17  0 - 53 U/L   Alkaline Phosphatase 56  39 - 117 U/L   Total Bilirubin 0.9  0.3 - 1.2 mg/dL   GFR calc non Af Amer 73 (*) >90 mL/min   GFR calc Af Amer 85 (*) >90 mL/min   Comment: (NOTE)     The eGFR has been calculated using the CKD EPI equation.     This calculation has not been validated in all clinical situations.     eGFR's persistently <90 mL/min signify possible Chronic Kidney     Disease.  Randolm Idol, ED     Status: None   Collection Time    09/25/13 12:27 PM      Result Value Ref Range   Troponin i, poc 0.01  0.00 - 0.08 ng/mL   Comment 3            Comment: Due to the release kinetics of cTnI,     a negative result within the first hours     of the onset of symptoms does not rule out     myocardial  infarction with certainty.     If myocardial infarction is still suspected,     repeat the test at appropriate intervals.  TROPONIN I     Status: None   Collection Time    09/25/13  6:10 PM      Result Value Ref Range   Troponin I <0.30  <0.30 ng/mL   Comment:            Due to the release kinetics of cTnI,     a negative result within the first hours     of the onset of symptoms does not rule out     myocardial infarction with certainty.     If myocardial infarction is still suspected,     repeat the test at appropriate intervals.  PROTIME-INR     Status: None   Collection Time    09/25/13  6:10 PM      Result Value Ref Range   Prothrombin Time 12.8  11.6 - 15.2 seconds   INR 0.98  0.00 - 1.49  TSH     Status: None   Collection Time    09/25/13  6:10 PM      Result Value Ref Range   TSH 2.921  0.350 - 4.500 uIU/mL   Comment: Performed at [facility]  T4, FREE     Status: None   Collection Time    09/25/13  6:10 PM      Result Value Ref Range   Free T4 1.09  0.80 - 1.80 ng/dL   Comment: Performed at [place]     Status: None   Collection Time    09/25/13  6:10 PM      Result Value Ref Range   Magnesium 1.8  1.5 - 2.5 mg/dL  APTT     Status: None   Collection Time    09/25/13  6:10 PM      Result Value Ref Range  aPTT 29  24 - 37 seconds  HEMOGLOBIN A1C     Status: Abnormal   Collection Time    09/25/13  6:10 PM      Result Value Ref Range   Hemoglobin A1C 5.7 (*) <5.7 %   Comment: (NOTE)                                                                               According to the ADA Clinical Practice Recommendations for 2011, when     HbA1c is used as a screening test:      >=6.5%   Diagnostic of Diabetes Mellitus               (if abnormal result is confirmed)     5.7-6.4%   Increased risk of developing Diabetes Mellitus     References:Diagnosis and Classification of Diabetes Mellitus,Diabetes     HBZJ,6967,89(FYBOF  1):S62-S69 and Standards of Medical Care in             Diabetes - 2011,Diabetes Care,2011,34 (Suppl 1):S11-S61.   Mean Plasma Glucose 117 (*) <117 mg/dL   Comment: Performed at [facility]  TROPONIN I     Status: None   Collection Time    09/25/13  9:55 PM      Result Value Ref Range   Troponin I <0.30  <0.30 ng/mL   Comment:            Due to the release kinetics of cTnI,     a negative result within the first hours     of the onset of symptoms does not rule out     myocardial infarction with certainty.     If myocardial infarction is still suspected,     repeat the test at appropriate intervals.  HEPARIN LEVEL (UNFRACTIONATED)     Status: Abnormal   Collection Time    09/26/13 12:00 AM      Result Value Ref Range   Heparin Unfractionated 0.29 (*) 0.30 - 0.70 IU/mL   Comment:            IF HEPARIN RESULTS ARE BELOW     EXPECTED VALUES, AND PATIENT     DOSAGE HAS BEEN CONFIRMED,     SUGGEST FOLLOW UP TESTING     OF ANTITHROMBIN III LEVELS.  TROPONIN I     Status: None   Collection Time    09/26/13  4:35 AM      Result Value Ref Range   Troponin I <0.30  <0.30 ng/mL   Comment:            Due to the release kinetics of cTnI,     a negative result within the first hours     of the onset of symptoms does not rule out     myocardial infarction with certainty.     If myocardial infarction is still suspected,     repeat the test at appropriate intervals.  CBC     Status: None   Collection Time    09/26/13  4:35 AM      Result Value Ref Range   WBC 6.1  4.0 - 10.5 K/uL   RBC 4.83  4.22 - 5.81 MIL/uL   Hemoglobin 16.0  13.0 - 17.0 g/dL   HCT 45.7  39.0 - 52.0 %   MCV 94.6  78.0 - 100.0 fL   MCH 33.1  26.0 - 34.0 pg   MCHC 35.0  30.0 - 36.0 g/dL   RDW 13.2  11.5 - 15.5 %   Platelets 197  150 - 400 K/uL  BASIC METABOLIC PANEL     Status: Abnormal   Collection Time    09/26/13  4:35 AM      Result Value Ref Range   Sodium 136 (*) 137 - 147 mEq/L   Potassium 4.1   3.7 - 5.3 mEq/L   Chloride 99  96 - 112 mEq/L   CO2 26  19 - 32 mEq/L   Glucose, Bld 100 (*) 70 - 99 mg/dL   BUN 13  6 - 23 mg/dL   Creatinine, Ser 1.10  0.50 - 1.35 mg/dL   Calcium 8.8  8.4 - 10.5 mg/dL   GFR calc non Af Amer 72 (*) >90 mL/min   GFR calc Af Amer 84 (*) >90 mL/min   Comment: (NOTE)     The eGFR has been calculated using the CKD EPI equation.     This calculation has not been validated in all clinical situations.     eGFR's persistently <90 mL/min signify possible Chronic Kidney     Disease.  HEPATIC FUNCTION PANEL     Status: None   Collection Time    09/26/13  4:35 AM      Result Value Ref Range   Total Protein 6.0  6.0 - 8.3 g/dL   Albumin 3.6  3.5 - 5.2 g/dL   AST 17  0 - 37 U/L   ALT 16  0 - 53 U/L   Alkaline Phosphatase 45  39 - 117 U/L   Total Bilirubin 1.0  0.3 - 1.2 mg/dL   Bilirubin, Direct <0.2  0.0 - 0.3 mg/dL   Indirect Bilirubin NOT CALCULATED  0.3 - 0.9 mg/dL  LIPID PANEL     Status: Abnormal   Collection Time    09/26/13  4:35 AM      Result Value Ref Range   Cholesterol 169  0 - 200 mg/dL   Triglycerides 157 (*) <150 mg/dL   HDL 55  >39 mg/dL   Total CHOL/HDL Ratio 3.1     VLDL 31  0 - 40 mg/dL   LDL Cholesterol 83  0 - 99 mg/dL   Comment:            Total Cholesterol/HDL:CHD Risk     Coronary Heart Disease Risk Table                         Men   Women      1/2 Average Risk   3.4   3.3      Average Risk       5.0   4.4      2 X Average Risk   9.6   7.1      3 X Average Risk  23.4   11.0                Use the calculated Patient Ratio     above and the CHD Risk Table     to determine the patient's CHD Risk.                ATP III  CLASSIFICATION (LDL):      <100     mg/dL   Optimal      100-129  mg/dL   Near or Above                        Optimal      130-159  mg/dL   Borderline      160-189  mg/dL   High      >190     mg/dL   Very High  HEPARIN LEVEL (UNFRACTIONATED)     Status: None   Collection Time    09/26/13  7:48 AM       Result Value Ref Range   Heparin Unfractionated 0.38  0.30 - 0.70 IU/mL   Comment:            IF HEPARIN RESULTS ARE BELOW     EXPECTED VALUES, AND PATIENT     DOSAGE HAS BEEN CONFIRMED,     SUGGEST FOLLOW UP TESTING     OF ANTITHROMBIN III LEVELS.    Imaging: Dg Chest Portable 1 View  09/25/2013   CLINICAL DATA:  Chest pain and tachycardia.  EXAM: PORTABLE CHEST - 1 VIEW  COMPARISON:  None.  FINDINGS: The heart size and mediastinal contours are within normal limits. There is no evidence of pulmonary edema, consolidation, pneumothorax, nodule or pleural fluid. The visualized skeletal structures are unremarkable.  IMPRESSION: No active disease.   Electronically Signed   By: Aletta Edouard M.D.   On: 09/25/2013 12:54    Assessment:  1. Principal Problem: 2.   Atrial flutter 3. Active Problems: 4.   Near syncope 5.   Plan:  1. No issues overnight. Troponin negative x 3. Rate control is good on cardizem - he is having an adrenaline component to rate with change in position, activity - may do better with beta-blocker. ?if he has cardiomyopathy - in retrospect, he thinks his symptoms have been going on for a while.  Plan for TEE/Cardioversion tomorrow.  Time Spent Directly with Patient:  15 minutes  Length of Stay:  LOS: 1 day   Pixie Casino, MD, [facility] Attending Cardiologist [facility]  Donnica Jarnagin C 09/26/2013, 9:10 AM

## 2013-09-26 NOTE — Progress Notes (Signed)
ANTICOAGULATION CONSULT NOTE - Follow Up Consult  Pharmacy Consult for heparin Indication: atrial fibrillation  Labs:  Recent Labs  09/25/13 1224 09/25/13 1810 09/25/13 2155 09/26/13  HGB 17.3*  --   --   --   HCT 47.6  --   --   --   PLT 216  --   --   --   APTT  --  29  --   --   LABPROT  --  12.8  --   --   INR  --  0.98  --   --   HEPARINUNFRC  --   --   --  0.29*  CREATININE 1.09  --   --   --   TROPONINI  --  <0.30 <0.30  --     Assessment: 58yo male slightly subtherapeutic on heparin with initial dosing for Afib, bolus was given a little late.  Goal of Therapy:  Heparin level 0.3-0.7 units/ml   Plan:  Will increase heparin gtt by ~1 unit/kg/hr to 1600 units/hr and check level in 6hr.  Vernard Gambles, PharmD, BCPS  09/26/2013,12:45 AM

## 2013-09-27 ENCOUNTER — Encounter (HOSPITAL_COMMUNITY): Payer: Self-pay | Admitting: Anesthesiology

## 2013-09-27 ENCOUNTER — Encounter (HOSPITAL_COMMUNITY): Payer: BC Managed Care – PPO | Admitting: Anesthesiology

## 2013-09-27 ENCOUNTER — Encounter (HOSPITAL_COMMUNITY)
Admission: EM | Disposition: A | Discharge status: Home / Self Care | Payer: Self-pay | Source: Home / Self Care | Attending: Internal Medicine

## 2013-09-27 ENCOUNTER — Inpatient Hospital Stay (HOSPITAL_COMMUNITY): Payer: BC Managed Care – PPO | Admitting: Anesthesiology

## 2013-09-27 ENCOUNTER — Other Ambulatory Visit: Payer: Self-pay | Admitting: Cardiology

## 2013-09-27 DIAGNOSIS — I4892 Unspecified atrial flutter: Secondary | ICD-10-CM

## 2013-09-27 DIAGNOSIS — I429 Cardiomyopathy, unspecified: Secondary | ICD-10-CM

## 2013-09-27 DIAGNOSIS — I4891 Unspecified atrial fibrillation: Secondary | ICD-10-CM

## 2013-09-27 HISTORY — PX: CARDIOVERSION: SHX1299

## 2013-09-27 HISTORY — PX: TEE WITHOUT CARDIOVERSION: SHX5443

## 2013-09-27 LAB — CBC
HCT: 46.9 % (ref 39.0–52.0)
Hemoglobin: 16.3 g/dL (ref 13.0–17.0)
MCH: 33.2 pg (ref 26.0–34.0)
MCHC: 34.8 g/dL (ref 30.0–36.0)
MCV: 95.5 fL (ref 78.0–100.0)
Platelets: 200 10*3/uL (ref 150–400)
RBC: 4.91 MIL/uL (ref 4.22–5.81)
RDW: 13.4 % (ref 11.5–15.5)
WBC: 5.8 10*3/uL (ref 4.0–10.5)

## 2013-09-27 LAB — HEPARIN LEVEL (UNFRACTIONATED): Heparin Unfractionated: 0.46 IU/mL (ref 0.30–0.70)

## 2013-09-27 SURGERY — ECHOCARDIOGRAM, TRANSESOPHAGEAL
Anesthesia: Monitor Anesthesia Care

## 2013-09-27 MED ORDER — HEPARIN (PORCINE) IN NACL 100-0.45 UNIT/ML-% IJ SOLN
1600.0000 [IU]/h | INTRAMUSCULAR | Status: AC
  Filled 2013-09-27: qty 250

## 2013-09-27 MED ORDER — APIXABAN 5 MG PO TABS: 5.0000 mg | ORAL_TABLET | Freq: Two times a day (BID) | ORAL | Status: DC

## 2013-09-27 MED ORDER — METOPROLOL SUCCINATE ER 25 MG PO TB24
25.0000 mg | ORAL_TABLET | Freq: Every day | ORAL | Status: DC
  Administered 2013-09-27: 25 mg via ORAL
  Filled 2013-09-27: qty 1

## 2013-09-27 MED ORDER — METOPROLOL SUCCINATE ER 25 MG PO TB24: 25.0000 mg | ORAL_TABLET | Freq: Every day | ORAL | Status: DC

## 2013-09-27 MED ORDER — SODIUM CHLORIDE 0.9 % IV SOLN
INTRAVENOUS | Status: DC | PRN
  Administered 2013-09-27 (×2): via INTRAVENOUS

## 2013-09-27 MED ORDER — APIXABAN 5 MG PO TABS
5.0000 mg | ORAL_TABLET | Freq: Two times a day (BID) | ORAL | Status: DC
  Filled 2013-09-27 (×2): qty 1

## 2013-09-27 MED ORDER — PROPOFOL 10 MG/ML IV BOLUS
INTRAVENOUS | Status: DC | PRN
  Administered 2013-09-27: 20 mg via INTRAVENOUS
  Administered 2013-09-27: 70 mg via INTRAVENOUS
  Administered 2013-09-27: 30 mg via INTRAVENOUS
  Administered 2013-09-27: 60 mg via INTRAVENOUS

## 2013-09-27 MED ORDER — APIXABAN 5 MG PO TABS
5.0000 mg | ORAL_TABLET | Freq: Two times a day (BID) | ORAL | Status: DC
  Administered 2013-09-27: 5 mg via ORAL
  Filled 2013-09-27 (×2): qty 1

## 2013-09-27 MED ORDER — SODIUM CHLORIDE 0.9 % IV SOLN: INTRAVENOUS | Status: DC

## 2013-09-27 MED ORDER — ASPIRIN 81 MG PO TBEC: 81.0000 mg | DELAYED_RELEASE_TABLET | Freq: Every day | ORAL | Status: DC

## 2013-09-27 MED ORDER — BACITRACIN 500 UNIT/GM EX OINT: 1.0000 "application " | TOPICAL_OINTMENT | Freq: Two times a day (BID) | CUTANEOUS | Status: DC

## 2013-09-27 NOTE — Progress Notes (Addendum)
ANTICOAGULATION CONSULT NOTE - Follow Up Consult  Pharmacy Consult for Heparin>> switch to apixaban Indication: atrial flutter  No Known Allergies  Patient Measurements: Height: 5\' 11"  (180.3 cm) Weight: 211 lb 4.8 oz (95.845 kg) IBW/kg (Calculated) : 75.3 Heparin Dosing Weight: 94.6kg  Vital Signs: Temp: 97.8 F (36.6 C) (03/30 0500) BP: 113/79 mmHg (03/30 0500) Pulse Rate: 69 (03/30 0500)  Labs:  Recent Labs  09/25/13 1224 09/25/13 1810 09/25/13 2155  09/26/13 0435 09/26/13 0748 09/26/13 1348 09/27/13 0520  HGB 17.3*  --   --   --  16.0  --   --  16.3  HCT 47.6  --   --   --  45.7  --   --  46.9  PLT 216  --   --   --  197  --   --  200  APTT  --  29  --   --   --   --   --   --   LABPROT  --  12.8  --   --   --   --   --   --   INR  --  0.98  --   --   --   --   --   --   HEPARINUNFRC  --   --   --   < >  --  0.38 0.41 0.46  CREATININE 1.09  --   --   --  1.10  --   --   --   TROPONINI  --  <0.30 <0.30  --  <0.30  --   --   --   < > = values in this interval not displayed.  Estimated Creatinine Clearance: 86.5 ml/min (by C-G formula based on Cr of 1.1).   Medications:  Heparin @ 1600 units/hr  Assessment: 58yom continues on heparin for atrial flutter with plans for TEE/DCCV today if he does not convert. Heparin level is therapeutic. CBC is stable. No bleeding reported.  Goal of Therapy:  Heparin level 0.3-0.7 units/ml Monitor platelets by anticoagulation protocol: Yes   Plan:  1) Continue heparin at 1600 units/hr 2) Follow up heparin level/CBC in AM 3) Follow up plan for oral AC  Fredrik Rigger 09/27/2013,8:47 AM  Addendum: Pharmacy has now been asked to change from IV heparin to apixaban. Apixaban dose will be 5mg  bid for age <80, weight >60kg, sCr 1.1. Patient on his way for TEE/DCCV right now so will switch to apixaban after the procedure.Will complete apixaban education.  Fredrik Rigger 09/27/2013, 11:08 AM

## 2013-09-27 NOTE — Care Management Note (Signed)
    Page 1 of 1   09/27/2013     3:08:48 PM   CARE MANAGEMENT NOTE 09/27/2013  Patient:  Walter [name], Walter [name]   Account Number:  [id number]  Date Initiated:  09/27/2013  Documentation initiated by:  GRAVES-BIGELOW,Orvill Coulthard  Subjective/Objective Assessment:   Pt admitted for Near syncope &  New onset atrial flutter. Sp TEE cardioversion. Plan for d/c on eliquis.     Action/Plan:   CM has benefits check in progress and will make pt aware once completed. CM will provide pt with 30 day free/ discount card. Pt will need Rx for 30 day free no refills and the original Rx with refills.   Anticipated DC Date:  09/28/2013   Anticipated DC Plan:  HOME/SELF CARE      DC Planning Services  CM consult  Medication Assistance      Choice offered to / List presented to:             Status of service:  Completed, signed off Medicare Important Message given?   (If response is "NO", the following Medicare IM given date fields will be blank) Date Medicare IM given:   Date Additional Medicare IM given:    Discharge Disposition:  HOME/SELF CARE  Per UR Regulation:  Reviewed for med. necessity/level of care/duration of stay  If discussed at Long Length of Stay Meetings, dates discussed:    Comments:

## 2013-09-27 NOTE — Preoperative (Signed)
Beta Blockers   Reason not to administer Beta Blockers:Not Applicable 

## 2013-09-27 NOTE — Anesthesia Preprocedure Evaluation (Signed)
Anesthesia Evaluation  Patient identified by MRN, date of birth, ID band Patient awake    Reviewed: Allergy & Precautions, H&P , NPO status , Patient's Chart, lab work & pertinent test results  Airway       Dental   Pulmonary          Cardiovascular + dysrhythmias Atrial Fibrillation     Neuro/Psych    GI/Hepatic   Endo/Other    Renal/GU      Musculoskeletal   Abdominal   Peds  Hematology   Anesthesia Other Findings Hx Retinal Detachment  Reproductive/Obstetrics                           Anesthesia Physical Anesthesia Plan  ASA: III  Anesthesia Plan: MAC   Post-op Pain Management:    Induction: Intravenous  Airway Management Planned: Mask  Additional Equipment:   Intra-op Plan:   Post-operative Plan:   Informed Consent: I have reviewed the patients History and Physical, chart, labs and discussed the procedure including the risks, benefits and alternatives for the proposed anesthesia with the patient or authorized representative who has indicated his/her understanding and acceptance.     Plan Discussed with:   Anesthesia Plan Comments:         Anesthesia Quick Evaluation

## 2013-09-27 NOTE — Discharge Instructions (Addendum)
Electrical Cardioversion, Care After Refer to this sheet in the next few weeks. These instructions provide you with information on caring for yourself after your procedure. Your health care provider may also give you more specific instructions. Your treatment has been planned according to current medical practices, but problems sometimes occur. Call your health care provider if you have any problems or questions after your procedure. WHAT TO EXPECT AFTER THE PROCEDURE After your procedure, it is typical to have the following sensations:  Some redness on the skin where the shocks were delivered. If this is tender, a sunburn lotion or hydrocortisone cream may help.  Possible return of an abnormal heart rhythm within hours or days after the procedure. HOME CARE INSTRUCTIONS  Only take medicine as directed by your health care provider. Be sure you understand how and when to take your medicine.  Learn how to feel your pulse and check it often.  Limit your activity for 48 hours after the procedure or as directed.  Avoid or minimize caffeine and other stimulants as directed. SEEK MEDICAL CARE IF:  You feel like your heart is beating too fast or your pulse is not regular.  You have any questions about your medicines.  You have bleeding that will not stop. SEEK IMMEDIATE MEDICAL CARE IF:  You are dizzy or feel faint.  It is hard to breathe or you feel short of breath.  There is a change in discomfort in your chest.  Your speech is slurred or you have trouble moving an arm or leg on one side of your body.  You get a serious muscle cramp that does not go away.  Your fingers or toes turn cold or blue. MAKE SURE YOU:   Understand these instructions.   Will watch your condition.   Will get help right away if you are not doing well or get worse. Document Released: 04/07/2013 Document Reviewed: 12/30/2012 [facility] Patient Information 2014 [place], [place]. Atrial Fibrillation Atrial  fibrillation is a type of irregular heart rhythm (arrhythmia). During atrial fibrillation, the upper chambers of the heart (atria) quiver continuously in a chaotic pattern. This causes an irregular and often rapid heart rate.  Atrial fibrillation is the result of the heart becoming overloaded with disorganized signals that tell it to beat. These signals are normally released one at a time by a part of the right atrium called the sinoatrial node. They then travel from the atria to the lower chambers of the heart (ventricles), causing the atria and ventricles to contract and pump blood as they pass. In atrial fibrillation, parts of the atria outside of the sinoatrial node also release these signals. This results in two problems. First, the atria receive so many signals that they do not have time to fully contract. Second, the ventricles, which can only receive one signal at a time, beat irregularly and out of rhythm with the atria.  There are three types of atrial fibrillation:   Paroxysmal Paroxysmal atrial fibrillation starts suddenly and stops on its own within a week.   Persistent Persistent atrial fibrillation lasts for more than a week. It may stop on its own or with treatment.   Permanent Permanent atrial fibrillation does not go away. Episodes of atrial fibrillation may lead to permanent atrial fibrillation.  Atrial fibrillation can prevent your heart from pumping blood normally. It increases your risk of stroke and can lead to heart failure.  CAUSES   Heart conditions, including a heart attack, heart failure, coronary artery disease, and heart  valve conditions.   Inflammation of the sac that surrounds the heart (pericarditis).   Blockage of an artery in the lungs (pulmonary embolism).   Pneumonia or other infections.   Chronic lung disease.   Thyroid problems, especially if the thyroid is overactive (hyperthyroidism).   Caffeine, excessive alcohol use, and use of some illegal  drugs.   Use of some medications, including certain decongestants and diet pills.   Heart surgery.   Birth defects.  Sometimes, no cause can be found. When this happens, the atrial fibrillation is called lone atrial fibrillation. The risk of complications from atrial fibrillation increases if you have lone atrial fibrillation and you are age 61 years or older. RISK FACTORS  Heart failure.  Coronary artery disease  Diabetes mellitus.   High blood pressure (hypertension).   Obesity.   Other arrhythmias.   Increased age. SYMPTOMS   A feeling that your heart is beating rapidly or irregularly.   A feeling of discomfort or pain in your chest.   Shortness of breath.   Sudden lightheadedness or weakness.   Getting tired easily when exercising.   Urinating more often than normal (mainly when atrial fibrillation first begins).  In paroxysmal atrial fibrillation, symptoms may start and suddenly stop. DIAGNOSIS  Your caregiver may be able to detect atrial fibrillation when taking your pulse. Usually, testing is needed to diagnosis atrial fibrillation. Tests may include:   Electrocardiography. During this test, the electrical impulses of your heart are recorded while you are lying down.   Echocardiography. During echocardiography, sound waves are used to evaluate how blood flows through your heart.   Stress test. There is more than one type of stress test. If a stress test is needed, ask your caregiver about which type is best for you.   Chest X-ray exam.   Blood tests.   Computed tomography (CT).  TREATMENT   Treating any underlying conditions. For example, if you have an overactive thyroid, treating the condition may correct atrial fibrillation.   Medication. Medications may be given to control a rapid heart rate or to prevent blood clots, heart failure, or a stroke.   Procedure to correct the rhythm of the heart:  Electrical cardioversion. During  electrical cardioversion, a controlled, low-energy shock is delivered to the heart through your skin. If you have chest pain, very low pressure blood pressure, or sudden heart failure, this procedure may need to be done as an emergency.  Catheter ablation. During this procedure, heart tissues that send the signals that cause atrial fibrillation are destroyed.  Maze or minimaze procedure. During this surgery, thin lines of heart tissue that carry the abnormal signals are destroyed. The maze procedure is an open-heart surgery. The minimaze procedure is a minimally invasive surgery. This means that small cuts are made to access the heart instead of a large opening.  Pulmonary venous isolation. During this surgery, tissue around the veins that carry blood from the lungs (pulmonary veins) is destroyed. This tissue is thought to carry the abnormal signals. HOME CARE INSTRUCTIONS   Take medications as directed by your caregiver.  Only take medications that your caregiver approves. Some medications can make atrial fibrillation worse or recur.  If blood thinners were prescribed by your caregiver, take them exactly as directed. Too much can cause bleeding. Too little and you will not have the needed protection against stroke and other problems.  Perform blood tests at home if directed by your caregiver.  Perform blood tests exactly as directed.  Quit smoking if you smoke.   Do not drink alcohol.   Do not drink caffeinated beverages such as coffee, soda, and some teas. You may drink decaffeinated coffee, soda, or tea.   Maintain a healthy weight. Do not use diet pills unless your caregiver approves. They may make heart problems worse.   Follow diet instructions as directed by your caregiver.   Exercise regularly as directed by your caregiver.   Keep all follow-up appointments. PREVENTION  The following substances can cause atrial fibrillation to recur:   Caffeinated beverages.    Alcohol.   Certain medications, especially those used for breathing problems.   Certain herbs and herbal medications, such as those containing ephedra or ginseng.  Illegal drugs such as cocaine and amphetamines. Sometimes medications are given to prevent atrial fibrillation from recurring. Proper treatment of any underlying condition is also important in helping prevent recurrence.  SEEK MEDICAL CARE IF:  You notice a change in the rate, rhythm, or strength of your heartbeat.   You suddenly begin urinating more frequently.   You tire more easily when exerting yourself or exercising.  SEEK IMMEDIATE MEDICAL CARE IF:   You develop chest pain, abdominal pain, sweating, or weakness.  You feel sick to your stomach (nauseous).  You develop shortness of breath.  You suddenly develop swollen feet and ankles.  You feel dizzy.  You face or limbs feel numb or weak.  There is a change in your vision or speech. MAKE SURE YOU:   Understand these instructions.  Will watch your condition.  Will get help right away if you are not doing well or get worse. Document Released: 06/17/2005 Document Revised: 10/12/2012 Document Reviewed: 07/28/2012 [facility] Patient Information 2014 [place], [place].  Information on my medicine - ELIQUIS (apixaban)  This medication education was reviewed with me or my healthcare representative as part of my discharge preparation.  The pharmacist that spoke with me during my hospital stay was:  Fredrik Rigger, [facility]  Why was Eliquis prescribed for you? Eliquis was prescribed for you to reduce the risk of a blood clot forming that can cause a stroke if you have a medical condition called atrial fibrillation (a type of irregular heartbeat).  What do You need to know about Eliquis ? Take your Eliquis TWICE DAILY - one tablet in the morning and one tablet in the evening with or without food. If you have difficulty swallowing the tablet whole  please discuss with your pharmacist how to take the medication safely.  Take Eliquis exactly as prescribed by your doctor and DO NOT stop taking Eliquis without talking to the doctor who prescribed the medication.  Stopping may increase your risk of developing a stroke.  Refill your prescription before you run out.  After discharge, you should have regular check-up appointments with your healthcare provider that is prescribing your Eliquis.  In the future your dose may need to be changed if your kidney function or weight changes by a significant amount or as you get older.  What do you do if you miss a dose? If you miss a dose, take it as soon as you remember on the same day and resume taking twice daily.  Do not take more than one dose of ELIQUIS at the same time to make up a missed dose.  Important Safety Information A possible side effect of Eliquis is bleeding. You should call your healthcare provider right away if you experience any of the following:   Bleeding from an  injury or your nose that does not stop.   Unusual colored urine (red or dark brown) or unusual colored stools (red or black).   Unusual bruising for unknown reasons.   A serious fall or if you hit your head (even if there is no bleeding).  Some medicines may interact with Eliquis and might increase your risk of bleeding or clotting while on Eliquis. To help avoid this, consult your healthcare provider or pharmacist prior to using any new prescription or non-prescription medications, including herbals, vitamins, non-steroidal anti-inflammatory drugs (NSAIDs) and supplements.  This website has more information on Eliquis (apixaban): [email or web address].cy.   Cardiac Diet This diet can help prevent heart disease and stroke. Many factors influence your heart health, including eating and exercise habits. Coronary risk rises a lot with abnormal blood fat (lipid) levels. Cardiac meal planning includes limiting unhealthy fats,  increasing healthy fats, and making other small dietary changes. General guidelines are as follows:  Adjust calorie intake to reach and maintain desirable body weight.  Limit total fat intake to less than 30% of total calories. Saturated fat should be less than 7% of calories.  Saturated fats are found in animal products and in some vegetable products. Saturated vegetable fats are found in coconut oil, cocoa butter, palm oil, and palm kernel oil. Read labels carefully to avoid these products as much as possible. Use butter in moderation. Choose tub margarines and oils that have 2 grams of fat or less. Good cooking oils are canola and olive oils.  Practice low-fat cooking techniques. Do not fry food. Instead, broil, bake, boil, steam, grill, roast on a rack, stir-fry, or microwave it. Other fat reducing suggestions include:  Remove the skin from poultry.  Remove all visible fat from meats.  Skim the fat off stews, soups, and gravies before serving them.  Steam vegetables in water or broth instead of sauting them in fat.  Avoid foods with trans fat (or hydrogenated oils), such as commercially fried foods and commercially baked goods. Commercial shortening and deep-frying fats will contain trans fat.  Increase intake of fruits, vegetables, whole grains, and legumes to replace foods high in fat.  Increase consumption of nuts, legumes, and seeds to at least 4 servings weekly. One serving of a legume equals  cup, and 1 serving of nuts or seeds equals  cup.  Choose whole grains more often. Have 3 servings per day (a serving is 1 ounce [oz]).  Eat 4 to 5 servings of vegetables per day. A serving of vegetables is 1 cup of raw leafy vegetables;  cup of raw or cooked cut-up vegetables;  cup of vegetable juice.  Eat 4 to 5 servings of fruit per day. A serving of fruit is 1 medium whole fruit;  cup of dried fruit;  cup of fresh, frozen, or canned fruit;  cup of 100% fruit juice.  Increase  your intake of dietary fiber to 20 to 30 grams per day. Insoluble fiber may help lower your risk of heart disease and may help curb your appetite.  Soluble fiber binds cholesterol to be removed from the blood. Foods high in soluble fiber are dried beans, citrus fruits, oats, apples, bananas, broccoli, Brussels sprouts, and eggplant.  Try to include foods fortified with plant sterols or stanols, such as yogurt, breads, juices, or margarines. Choose several fortified foods to achieve a daily intake of 2 to 3 grams of plant sterols or stanols.  Foods with omega-3 fats can help reduce your risk of heart disease. Aim  to have a 3.5 oz portion of fatty fish twice per week, such as salmon, mackerel, albacore tuna, sardines, lake trout, or herring. If you wish to take a fish oil supplement, choose one that contains 1 gram of both DHA and EPA.  Limit processed meats to 2 servings (3 oz portion) weekly.  Limit the sodium in your diet to 1500 milligrams (mg) per day. If you have high blood pressure, talk to a registered dietitian about a DASH (Dietary Approaches to Stop Hypertension) eating plan.  Limit sweets and beverages with added sugar, such as soda, to no more than 5 servings per week. One serving is:   1 tablespoon sugar.  1 tablespoon jelly or jam.   cup sorbet.  1 cup lemonade.   cup regular soda. CHOOSING FOODS Starches  Allowed: Breads: All kinds (wheat, rye, raisin, white, oatmeal, [place], [place], and English muffin bread). Low-fat rolls: English muffins, frankfurter and hamburger buns, bagels, pita bread, tortillas (not fried). Pancakes, waffles, biscuits, and muffins made with recommended oil.  Avoid: Products made with saturated or trans fats, oils, or whole milk products. Butter rolls, cheese breads, croissants. Commercial doughnuts, muffins, sweet rolls, biscuits, waffles, pancakes, store-bought mixes. Crackers  Allowed: Low-fat crackers and snacks: Animal, graham, rye, saltine  (with recommended oil, no lard), oyster, and matzo crackers. Bread sticks, melba toast, rusks, flatbread, pretzels, and light popcorn.  Avoid: High-fat crackers: cheese crackers, butter crackers, and those made with coconut, palm oil, or trans fat (hydrogenated oils). Buttered popcorn. Cereals  Allowed: Hot or cold whole-grain cereals.  Avoid: Cereals containing coconut, hydrogenated vegetable fat, or animal fat. Potatoes / Pasta / Rice  Allowed: All kinds of potatoes, rice, and pasta (such as macaroni, spaghetti, and noodles).  Avoid: Pasta or rice prepared with cream sauce or high-fat cheese. Chow mein noodles, [place] fries. Vegetables  Allowed: All vegetables and vegetable juices.  Avoid: Fried vegetables. Vegetables in cream, butter, or high-fat cheese sauces. Limit coconut. Fruit in cream or custard. Protein  Allowed: Limit your intake of meat, seafood, and poultry to no more than 6 oz (cooked weight) per day. All lean, well-trimmed beef, veal, pork, and lamb. All chicken and [place] without skin. All fish and shellfish. Wild game: wild duck, rabbit, pheasant, and venison. Egg whites or low-cholesterol egg substitutes may be used as desired. Meatless dishes: recipes with dried beans, peas, lentils, and tofu (soybean curd). Seeds and nuts: all seeds and most nuts.  Avoid: Prime grade and other heavily marbled and fatty meats, such as short ribs, spare ribs, rib eye roast or steak, frankfurters, sausage, bacon, and high-fat luncheon meats, mutton. Caviar. Commercially fried fish. Domestic duck, goose, venison sausage. Organ meats: liver, gizzard, heart, chitterlings, brains, kidney, sweetbreads. Dairy  Allowed: Low-fat cheeses: nonfat or low-fat cottage cheese (1% or 2% fat), cheeses made with part skim milk, such as mozzarella, farmers, string, or ricotta. (Cheeses should be labeled no more than 2 to 6 grams fat per oz.). Skim (or 1%) milk: liquid, powdered, or evaporated. Buttermilk made  with low-fat milk. Drinks made with skim or low-fat milk or cocoa. Chocolate milk or cocoa made with skim or low-fat (1%) milk. Nonfat or low-fat yogurt.  Avoid: Whole milk cheeses, including colby, cheddar, [place], [address], [place], [place], [place], [address], [place], and blue. Creamed cottage cheese, cream cheese. Whole milk and whole milk products, including buttermilk or yogurt made from whole milk, drinks made from whole milk. Condensed milk, evaporated whole milk, and 2% milk. Soups and Combination Foods  Allowed: Low-fat low-sodium soups: broth, dehydrated soups, homemade broth, soups with the fat removed, homemade cream soups made with skim or low-fat milk. Low-fat spaghetti, lasagna, chili, and Spanish rice if low-fat ingredients and low-fat cooking techniques are used.  Avoid: Cream soups made with whole milk, cream, or high-fat cheese. All other soups. Desserts and Sweets  Allowed: Sherbet, fruit ices, gelatins, meringues, and angel food cake. Homemade desserts with recommended fats, oils, and milk products. Jam, jelly, honey, marmalade, sugars, and syrups. Pure sugar candy, such as gum drops, hard candy, jelly beans, marshmallows, mints, and small amounts of dark chocolate.  Avoid: Commercially prepared cakes, pies, cookies, frosting, pudding, or mixes for these products. Desserts containing whole milk products, chocolate, coconut, lard, palm oil, or palm kernel oil. Ice cream or ice cream drinks. Candy that contains chocolate, coconut, butter, hydrogenated fat, or unknown ingredients. Buttered syrups. Fats and Oils  Allowed: Vegetable oils: safflower, sunflower, corn, soybean, cottonseed, sesame, canola, olive, or peanut. Non-hydrogenated margarines. Salad dressing or mayonnaise: homemade or commercial, made with a recommended oil. Low or nonfat salad dressing or mayonnaise.  Limit added fats and oils to 6 to 8 tsp per day (includes fats used in cooking, baking, salads, and  spreads on bread). Remember to count the "hidden fats" in foods.  Avoid: Solid fats and shortenings: butter, lard, salt pork, bacon drippings. Gravy containing meat fat, shortening, or suet. Cocoa butter, coconut. Coconut oil, palm oil, palm kernel oil, or hydrogenated oils: these ingredients are often used in bakery products, nondairy creamers, whipped toppings, candy, and commercially fried foods. Read labels carefully. Salad dressings made of unknown oils, sour cream, or cheese, such as blue cheese and Roquefort. Cream, all kinds: half-and-half, light, heavy, or whipping. Sour cream or cream cheese (even if "light" or low-fat). Nondairy cream substitutes: coffee creamers and sour cream substitutes made with palm, palm kernel, hydrogenated oils, or coconut oil. Beverages  Allowed: Coffee (regular or decaffeinated), tea. Diet carbonated beverages, mineral water. Alcohol: Check with your caregiver. Moderation is recommended.  Avoid: Whole milk, regular sodas, and juice drinks with added sugar. Condiments  Allowed: All seasonings and condiments. Cocoa powder. "Cream" sauces made with recommended ingredients.  Avoid: Carob powder made with hydrogenated fats. SAMPLE MENU Breakfast   cup orange juice   cup oatmeal  1 slice toast  1 tsp margarine  1 cup skim milk Lunch  [place] sandwich with 2 oz [place], 2 slices bread  Lettuce and tomato slices  Fresh fruit  Carrot sticks  Coffee or tea Snack  Fresh fruit or low-fat crackers Dinner  3 oz lean ground beef  1 baked potato  1 tsp margarine   cup asparagus  Lettuce salad  1 tbs non-creamy dressing   cup peach slices  1 cup skim milk Document Released: 03/26/2008 Document Revised: 12/17/2011 Document Reviewed: 09/10/2011 [employer] Patient Information 2014 [place], [place].

## 2013-09-27 NOTE — Discharge Summary (Signed)
Patient ID: Walter [name],  MRN: [id number], DOB/AGE: 03/01/1956 58 y.o.  Admit date: 09/25/2013 Discharge date: 09/27/2013  Primary Care Provider: Donnie Coffin, MD Primary Cardiologist: Dr Debara Pickett (new)  Discharge Diagnoses Principal Problem:   Atrial flutter Active Problems:   Near syncope   Cardiomyopathy- etiology not yet determined- EF 45-50% at TEE    Procedures: TEE CV 09/27/13   Hospital Course:  Pleasant 58 yo male with no significant past medical history. He was feeling unwell the night prior to admission, somewhat dizzy and pre-syncopal. He went for his morning workout 09/25/13 and noted that his pulse was very rapid. He ran into Dr. Lia Foyer at the gym who listened to him and noted he was out of rhythm and sent him to urgent care. His EKG there initially suggested a STEMI with J point elevation and he was transported urgently to [facility] ER where STEMI was subsequently cancelled. He was placed on Heparin and Diltiazem. He was changed to Toprol after it was noted his HR was not well controlled when up. He underwent TEE CV 09/27/13 to NSR. His EF at TEE was mildly depressed at 45-50%. He was changed to Eliquis and we feel he can be discharged later on the 30th. He will need uninterrupted anticoagulation for at least 4 weeks. Dr Debara Pickett would like him to have an OP GXT Myoview in one week and an OV in two weeks.    Discharge Vitals:  Blood pressure 119/88, pulse 52, temperature 97.9 F (36.6 C), temperature source Oral, resp. rate 13, height '5\' 11"'  (1.803 m), weight 211 lb 4.8 oz (95.845 kg), SpO2 100.00%.    Labs: Results for orders placed during the hospital encounter of 09/25/13 (from the past 48 hour(s))  TROPONIN I     Status: None   Collection Time    09/25/13  6:10 PM      Result Value Ref Range   Troponin I <0.30  <0.30 ng/mL   Comment:            Due to the release kinetics of cTnI,     a negative result within the first hours     of the onset of symptoms does not  rule out     myocardial infarction with certainty.     If myocardial infarction is still suspected,     repeat the test at appropriate intervals.  PROTIME-INR     Status: None   Collection Time    09/25/13  6:10 PM      Result Value Ref Range   Prothrombin Time 12.8  11.6 - 15.2 seconds   INR 0.98  0.00 - 1.49  TSH     Status: None   Collection Time    09/25/13  6:10 PM      Result Value Ref Range   TSH 2.921  0.350 - 4.500 uIU/mL   Comment: Performed at [facility]  T4, FREE     Status: None   Collection Time    09/25/13  6:10 PM      Result Value Ref Range   Free T4 1.09  0.80 - 1.80 ng/dL   Comment: Performed at [place]     Status: None   Collection Time    09/25/13  6:10 PM      Result Value Ref Range   Magnesium 1.8  1.5 - 2.5 mg/dL  APTT     Status: None   Collection Time  09/25/13  6:10 PM      Result Value Ref Range   aPTT 29  24 - 37 seconds  HEMOGLOBIN A1C     Status: Abnormal   Collection Time    09/25/13  6:10 PM      Result Value Ref Range   Hemoglobin A1C 5.7 (*) <5.7 %   Comment: (NOTE)                                                                               According to the ADA Clinical Practice Recommendations for 2011, when     HbA1c is used as a screening test:      >=6.5%   Diagnostic of Diabetes Mellitus               (if abnormal result is confirmed)     5.7-6.4%   Increased risk of developing Diabetes Mellitus     References:Diagnosis and Classification of Diabetes Mellitus,Diabetes     NLZJ,6734,19(FXTKW 1):S62-S69 and Standards of Medical Care in             Diabetes - 2011,Diabetes Care,2011,34 (Suppl 1):S11-S61.   Mean Plasma Glucose 117 (*) <117 mg/dL   Comment: Performed at [facility]  TROPONIN I     Status: None   Collection Time    09/25/13  9:55 PM      Result Value Ref Range   Troponin I <0.30  <0.30 ng/mL   Comment:            Due to the release kinetics of cTnI,     a negative  result within the first hours     of the onset of symptoms does not rule out     myocardial infarction with certainty.     If myocardial infarction is still suspected,     repeat the test at appropriate intervals.  HEPARIN LEVEL (UNFRACTIONATED)     Status: Abnormal   Collection Time    09/26/13 12:00 AM      Result Value Ref Range   Heparin Unfractionated 0.29 (*) 0.30 - 0.70 IU/mL   Comment:            IF HEPARIN RESULTS ARE BELOW     EXPECTED VALUES, AND PATIENT     DOSAGE HAS BEEN CONFIRMED,     SUGGEST FOLLOW UP TESTING     OF ANTITHROMBIN III LEVELS.  TROPONIN I     Status: None   Collection Time    09/26/13  4:35 AM      Result Value Ref Range   Troponin I <0.30  <0.30 ng/mL   Comment:            Due to the release kinetics of cTnI,     a negative result within the first hours     of the onset of symptoms does not rule out     myocardial infarction with certainty.     If myocardial infarction is still suspected,     repeat the test at appropriate intervals.  CBC     Status: None   Collection Time    09/26/13  4:35 AM      Result Value  Ref Range   WBC 6.1  4.0 - 10.5 K/uL   RBC 4.83  4.22 - 5.81 MIL/uL   Hemoglobin 16.0  13.0 - 17.0 g/dL   HCT 45.7  39.0 - 52.0 %   MCV 94.6  78.0 - 100.0 fL   MCH 33.1  26.0 - 34.0 pg   MCHC 35.0  30.0 - 36.0 g/dL   RDW 13.2  11.5 - 15.5 %   Platelets 197  150 - 400 K/uL  BASIC METABOLIC PANEL     Status: Abnormal   Collection Time    09/26/13  4:35 AM      Result Value Ref Range   Sodium 136 (*) 137 - 147 mEq/L   Potassium 4.1  3.7 - 5.3 mEq/L   Chloride 99  96 - 112 mEq/L   CO2 26  19 - 32 mEq/L   Glucose, Bld 100 (*) 70 - 99 mg/dL   BUN 13  6 - 23 mg/dL   Creatinine, Ser 1.10  0.50 - 1.35 mg/dL   Calcium 8.8  8.4 - 10.5 mg/dL   GFR calc non Af Amer 72 (*) >90 mL/min   GFR calc Af Amer 84 (*) >90 mL/min   Comment: (NOTE)     The eGFR has been calculated using the CKD EPI equation.     This calculation has not been  validated in all clinical situations.     eGFR's persistently <90 mL/min signify possible Chronic Kidney     Disease.  HEPATIC FUNCTION PANEL     Status: None   Collection Time    09/26/13  4:35 AM      Result Value Ref Range   Total Protein 6.0  6.0 - 8.3 g/dL   Albumin 3.6  3.5 - 5.2 g/dL   AST 17  0 - 37 U/L   ALT 16  0 - 53 U/L   Alkaline Phosphatase 45  39 - 117 U/L   Total Bilirubin 1.0  0.3 - 1.2 mg/dL   Bilirubin, Direct <0.2  0.0 - 0.3 mg/dL   Indirect Bilirubin NOT CALCULATED  0.3 - 0.9 mg/dL  LIPID PANEL     Status: Abnormal   Collection Time    09/26/13  4:35 AM      Result Value Ref Range   Cholesterol 169  0 - 200 mg/dL   Triglycerides 157 (*) <150 mg/dL   HDL 55  >39 mg/dL   Total CHOL/HDL Ratio 3.1     VLDL 31  0 - 40 mg/dL   LDL Cholesterol 83  0 - 99 mg/dL   Comment:            Total Cholesterol/HDL:CHD Risk     Coronary Heart Disease Risk Table                         Men   Women      1/2 Average Risk   3.4   3.3      Average Risk       5.0   4.4      2 X Average Risk   9.6   7.1      3 X Average Risk  23.4   11.0                Use the calculated Patient Ratio     above and the CHD Risk Table     to determine the patient's CHD Risk.  ATP III CLASSIFICATION (LDL):      <100     mg/dL   Optimal      100-129  mg/dL   Near or Above                        Optimal      130-159  mg/dL   Borderline      160-189  mg/dL   High      >190     mg/dL   Very High  HEPARIN LEVEL (UNFRACTIONATED)     Status: None   Collection Time    09/26/13  7:48 AM      Result Value Ref Range   Heparin Unfractionated 0.38  0.30 - 0.70 IU/mL   Comment:            IF HEPARIN RESULTS ARE BELOW     EXPECTED VALUES, AND PATIENT     DOSAGE HAS BEEN CONFIRMED,     SUGGEST FOLLOW UP TESTING     OF ANTITHROMBIN III LEVELS.  HEPARIN LEVEL (UNFRACTIONATED)     Status: None   Collection Time    09/26/13  1:48 PM      Result Value Ref Range   Heparin Unfractionated  0.41  0.30 - 0.70 IU/mL   Comment:            IF HEPARIN RESULTS ARE BELOW     EXPECTED VALUES, AND PATIENT     DOSAGE HAS BEEN CONFIRMED,     SUGGEST FOLLOW UP TESTING     OF ANTITHROMBIN III LEVELS.  HEPARIN LEVEL (UNFRACTIONATED)     Status: None   Collection Time    09/27/13  5:20 AM      Result Value Ref Range   Heparin Unfractionated 0.46  0.30 - 0.70 IU/mL   Comment:            IF HEPARIN RESULTS ARE BELOW     EXPECTED VALUES, AND PATIENT     DOSAGE HAS BEEN CONFIRMED,     SUGGEST FOLLOW UP TESTING     OF ANTITHROMBIN III LEVELS.  CBC     Status: None   Collection Time    09/27/13  5:20 AM      Result Value Ref Range   WBC 5.8  4.0 - 10.5 K/uL   RBC 4.91  4.22 - 5.81 MIL/uL   Hemoglobin 16.3  13.0 - 17.0 g/dL   HCT 46.9  39.0 - 52.0 %   MCV 95.5  78.0 - 100.0 fL   MCH 33.2  26.0 - 34.0 pg   MCHC 34.8  30.0 - 36.0 g/dL   RDW 13.4  11.5 - 15.5 %   Platelets 200  150 - 400 K/uL    Disposition:      Follow-up Information   Follow up with Pixie Casino, MD. (Office will call for exercise test in one week, office visit in two weeks)    Specialty:  Cardiology   Contact information:   [place] [address] [phone]       Discharge Medications:    Medication List         apixaban 5 MG Tabs tablet  Commonly known as:  ELIQUIS  Take 1 tablet (5 mg total) by mouth 2 (two) times daily.  Start taking on:  09/29/2013     aspirin 81 MG EC tablet  Take 1 tablet (81 mg total) by mouth daily.  Start taking on:  09/29/2013     LAMISIL 250 MG tablet  Generic drug:  terbinafine  Take 250 mg by mouth daily. 7 day prescription     metoprolol succinate 25 MG 24 hr tablet  Commonly known as:  TOPROL-XL  Take 1 tablet (25 mg total) by mouth daily.  Start taking on:  09/29/2013     multivitamin with minerals Tabs tablet  Take 1 tablet by mouth daily. [employer] over 50 vitamin packet     PROBIOTIC ACIDOPHILUS BEADS PO  Take 1 capsule by mouth  daily.         Duration of Discharge Encounter: Greater than 30 minutes including physician time.  Angelena Form PA-C 09/27/2013 2:05 PM

## 2013-09-27 NOTE — Interval H&P Note (Signed)
History and Physical Interval Note:  09/27/2013 12:30 PM  Walter [name]  has presented today for surgery, with the diagnosis of afib  The various methods of treatment have been discussed with the patient and family. After consideration of risks, benefits and other options for treatment, the patient has consented to  Procedure(s): TRANSESOPHAGEAL ECHOCARDIOGRAM (TEE) (N/A) CARDIOVERSION (N/A) as a surgical intervention .  The patient's history has been reviewed, patient examined, no change in status, stable for surgery.  I have reviewed the patient's chart and labs.  Questions were answered to the patient's satisfaction.     Olga Millers

## 2013-09-27 NOTE — Progress Notes (Signed)
    Subjective:  No complaints at rest. He has no awareness of an irregular HR at rest.   Objective:  Vital Signs in the last 24 hours: Temp:  [97.8 F (36.6 C)-98.1 F (36.7 C)] 97.8 F (36.6 C) (03/30 0500) Pulse Rate:  [69-86] 69 (03/30 0500) Resp:  [16-18] 18 (03/30 0500) BP: (108-134)/(65-84) 113/79 mmHg (03/30 0500) SpO2:  [96 %-98 %] 98 % (03/30 0500)  Intake/Output from previous day:  Intake/Output Summary (Last 24 hours) at 09/27/13 0805 Last data filed at 09/26/13 2300  Gross per 24 hour  Intake    264 ml  Output      0 ml  Net    264 ml    Physical Exam: General appearance: alert, cooperative and no distress Lungs: clear to auscultation bilaterally Heart: irregularly irregular rhythm   Rate: 100  Rhythm: atrial fibrillation and atrial flutter  Lab Results:  Recent Labs  09/26/13 0435 09/27/13 0520  WBC 6.1 5.8  HGB 16.0 16.3  PLT 197 200    Recent Labs  09/25/13 1224 09/26/13 0435  NA 140 136*  K 4.9 4.1  CL 100 99  CO2 27 26  GLUCOSE 102* 100*  BUN 17 13  CREATININE 1.09 1.10    Recent Labs  09/25/13 2155 09/26/13 0435  TROPONINI <0.30 <0.30    Recent Labs  09/25/13 1810  INR 0.98    Imaging: Imaging results have been reviewed  Cardiac Studies:  Assessment/Plan:  Pleasant 58 yo male with no significant past medical history. He was feeling unwell the night prior to admission, somewhat dizzy and pre-syncopal. He went for his morning workout 09/25/13 and noted that his pulse was very rapid. He ran into Dr. Stuckey at the gym who listened to him and noted he was out of rhythm and sent him to urgent care. His EKG there initially suggested a STEMI with J point elevation and he was transported urgently to [facility] ER where STEMI was subsequently cancelled.    Principal Problem:   Atrial flutter Active Problems:   Near syncope    PLAN: Will give Toprol now. TEE CV later today if he doesn't convert.  Luke Kilroy PA-C Beeper  [phone] 09/27/2013, 8:05 AM Patient seen and examined. I agree with the assessment and plan as detailed above. See also my additional thoughts below.   Plans are being made to proceed with TEE cardioversion today. We are not sure exactly how long he has been in atrial flutter. Therefore, it appears most prudent that he is fully anticoagulated when he leaves the hospital. I will ask pharmacy for advice concerning transitioned from heparin to Eliquis.  Camora Tremain, MD, FACC 09/27/2013 10:47 AM     

## 2013-09-27 NOTE — H&P (View-Only) (Signed)
    Subjective:  No complaints at rest. He has no awareness of an irregular HR at rest.   Objective:  Vital Signs in the last 24 hours: Temp:  [97.8 F (36.6 C)-98.1 F (36.7 C)] 97.8 F (36.6 C) (03/30 0500) Pulse Rate:  [69-86] 69 (03/30 0500) Resp:  [16-18] 18 (03/30 0500) BP: (108-134)/(65-84) 113/79 mmHg (03/30 0500) SpO2:  [96 %-98 %] 98 % (03/30 0500)  Intake/Output from previous day:  Intake/Output Summary (Last 24 hours) at 09/27/13 0805 Last data filed at 09/26/13 2300  Gross per 24 hour  Intake    264 ml  Output      0 ml  Net    264 ml    Physical Exam: General appearance: alert, cooperative and no distress Lungs: clear to auscultation bilaterally Heart: irregularly irregular rhythm   Rate: 100  Rhythm: atrial fibrillation and atrial flutter  Lab Results:  Recent Labs  09/26/13 0435 09/27/13 0520  WBC 6.1 5.8  HGB 16.0 16.3  PLT 197 200    Recent Labs  09/25/13 1224 09/26/13 0435  NA 140 136*  K 4.9 4.1  CL 100 99  CO2 27 26  GLUCOSE 102* 100*  BUN 17 13  CREATININE 1.09 1.10    Recent Labs  09/25/13 2155 09/26/13 0435  TROPONINI <0.30 <0.30    Recent Labs  09/25/13 1810  INR 0.98    Imaging: Imaging results have been reviewed  Cardiac Studies:  Assessment/Plan:  Pleasant 58 yo male with no significant past medical history. He was feeling unwell the night prior to admission, somewhat dizzy and pre-syncopal. He went for his morning workout 09/25/13 and noted that his pulse was very rapid. He ran into Dr. Riley Kill at the gym who listened to him and noted he was out of rhythm and sent him to urgent care. His EKG there initially suggested a STEMI with J point elevation and he was transported urgently to [facility] ER where STEMI was subsequently cancelled.    Principal Problem:   Atrial flutter Active Problems:   Near syncope    PLAN: Will give Toprol now. TEE CV later today if he doesn't convert.  Corine Shelter PA-C Beeper  [phone] 09/27/2013, 8:05 AM Patient seen and examined. I agree with the assessment and plan as detailed above. See also my additional thoughts below.   Plans are being made to proceed with TEE cardioversion today. We are not sure exactly how long he has been in atrial flutter. Therefore, it appears most prudent that he is fully anticoagulated when he leaves the hospital. I will ask pharmacy for advice concerning transitioned from heparin to Eliquis.  Willa Rough, MD, [facility] 09/27/2013 10:47 AM

## 2013-09-27 NOTE — Transfer of Care (Signed)
2Immediate Anesthesia Transfer of Care Note  Patient: Walter [name]  Procedure(s) Performed: Procedure(s): TRANSESOPHAGEAL ECHOCARDIOGRAM (TEE) (N/A) CARDIOVERSION (N/A)  Patient Location: PACU, Mother/Baby and Endoscopy Unit  Anesthesia Type:General  Level of Consciousness: awake and alert   Airway & Oxygen Therapy: Patient Spontanous Breathing and Patient connected to nasal cannula oxygen  Post-op Assessment: Report given to PACU RN and Post -op Vital signs reviewed and stable  Post vital signs: Reviewed and stable  Complications: No apparent anesthesia complications

## 2013-09-27 NOTE — CV Procedure (Signed)
Patient sedated by anesthesia with total of 180 mg diprovan IV. TEE showed distal anterior hypokinesis with overall mildly reduced LV function (EF 45-50); no LAA thrombus. Patient subsequently cardioverted to sinus rhythm with 75 J (synchronized). Continue anticoagulation for at least 4 weeks uninterrupted. Olga Millers

## 2013-09-27 NOTE — Anesthesia Postprocedure Evaluation (Signed)
  Anesthesia Post-op Note  Patient: Walter [name]  Procedure(s) Performed: Procedure(s): TRANSESOPHAGEAL ECHOCARDIOGRAM (TEE) (N/A) CARDIOVERSION (N/A)  Patient Location: PACU and Endoscopy Unit  Anesthesia Type:MAC  Level of Consciousness: awake, alert , oriented and patient cooperative  Airway and Oxygen Therapy: Patient Spontanous Breathing  Post-op Pain: none  Post-op Assessment: Post-op Vital signs reviewed, Patient's Cardiovascular Status Stable, Respiratory Function Stable, Patent Airway, No signs of Nausea or vomiting and Pain level controlled  Post-op Vital Signs: stable  Complications: No apparent anesthesia complications

## 2013-09-27 NOTE — Progress Notes (Signed)
UR Completed Aiysha Jillson Graves-Bigelow, RN,BSN [phone]  

## 2013-09-28 ENCOUNTER — Telehealth: Payer: Self-pay | Admitting: Cardiovascular Disease

## 2013-09-28 ENCOUNTER — Encounter (HOSPITAL_COMMUNITY): Payer: Self-pay | Admitting: Cardiology

## 2013-09-30 ENCOUNTER — Encounter: Payer: Self-pay | Admitting: Cardiology

## 2013-10-04 ENCOUNTER — Telehealth: Payer: Self-pay | Admitting: Physician Assistant

## 2013-10-06 ENCOUNTER — Telehealth (HOSPITAL_COMMUNITY): Payer: Self-pay

## 2013-10-08 ENCOUNTER — Ambulatory Visit (HOSPITAL_COMMUNITY)
Admission: RE | Admit: 2013-10-08 | Discharge: 2013-10-08 | Disposition: A | Discharge status: Home / Self Care | Payer: BC Managed Care – PPO | Source: Ambulatory Visit | Attending: Internal Medicine | Admitting: Internal Medicine

## 2013-10-08 DIAGNOSIS — Z8249 Family history of ischemic heart disease and other diseases of the circulatory system: Secondary | ICD-10-CM | POA: Insufficient documentation

## 2013-10-08 DIAGNOSIS — I429 Cardiomyopathy, unspecified: Secondary | ICD-10-CM

## 2013-10-08 DIAGNOSIS — R9431 Abnormal electrocardiogram [ECG] [EKG]: Secondary | ICD-10-CM | POA: Insufficient documentation

## 2013-10-08 DIAGNOSIS — I428 Other cardiomyopathies: Secondary | ICD-10-CM

## 2013-10-08 DIAGNOSIS — R002 Palpitations: Secondary | ICD-10-CM | POA: Insufficient documentation

## 2013-10-08 DIAGNOSIS — R0602 Shortness of breath: Secondary | ICD-10-CM | POA: Insufficient documentation

## 2013-10-08 DIAGNOSIS — I4891 Unspecified atrial fibrillation: Secondary | ICD-10-CM

## 2013-10-08 DIAGNOSIS — R42 Dizziness and giddiness: Secondary | ICD-10-CM | POA: Insufficient documentation

## 2013-10-08 MED ORDER — TECHNETIUM TC 99M SESTAMIBI GENERIC - CARDIOLITE
10.5000 | Freq: Once | INTRAVENOUS | Status: AC | PRN
  Administered 2013-10-08: 11 via INTRAVENOUS

## 2013-10-08 MED ORDER — TECHNETIUM TC 99M SESTAMIBI GENERIC - CARDIOLITE
29.9000 | Freq: Once | INTRAVENOUS | Status: AC | PRN
  Administered 2013-10-08: 29.9 via INTRAVENOUS

## 2013-10-08 NOTE — Procedures (Addendum)
[facility] [address] [place] [address] [phone]  Cardiology Nuclear Med Study  Walter [name] is a 57 y.o. male     MRN : [id number]     DOB: 15-Oct-1955  Procedure Date: 10/08/2013  Nuclear Med Background Indication for Stress Test:  [facility] and Abnormal EKG History:  A-Flutter;cardiomyopathy;PAF;TEE cardioversion-09/27/2013;No prior NUC study for comparrison Cardiac Risk Factors: Family History - CAD and Overweight  Symptoms:  Dizziness, Light-Headedness, Palpitations and SOB   Nuclear Pre-Procedure Caffeine/Decaff Intake:  7:00pm NPO After: 5:00am   IV Site: R Forearm  IV 0.9% NS with Angio Cath:  22g  Chest Size (in):  44"  IV Started by: Berdie Ogren, RN  Height: 5\' 11"  (1.803 m)  Cup Size: n/a  BMI:  Body mass index is 29.44 kg/(m^2). Weight:  211 lb (95.709 kg)   Tech Comments:  n/a    Nuclear Med Study 1 or 2 day study: 1 day  Stress Test Type:  Stress  Order Authorizing Provider:  Nanetta Batty, MD   Resting Radionuclide: Technetium 35m Sestamibi  Resting Radionuclide Dose: 10.5 mCi   Stress Radionuclide:  Technetium 87m Sestamibi  Stress Radionuclide Dose: 29.9 mCi           Stress Protocol Rest HR: 64 Stress HR: 157  Rest BP: 111/76 Stress BP: 177/63  Exercise Time (min): 13:31 METS: 16.20   Predicted Max HR: 162 bpm % Max HR: 96.91 bpm Rate Pressure Product: 73428  Dose of Adenosine (mg):  n/a Dose of Lexiscan: n/a mg  Dose of Atropine (mg): n/a Dose of Dobutamine: n/a mcg/kg/min (at max HR)  Stress Test Technologist: Ernestene Mention, CCT Nuclear Technologist: Gonzella Lex, CNMT   Rest Procedure:  Myocardial perfusion imaging was performed at rest 45 minutes following the intravenous administration of Technetium 58m Sestamibi. Stress Procedure:  The patient performed treadmill exercise using a Bruce  Protocol for 13 minutes and 31 seconds. The patient stopped due to generalized  fatigue. Patient denied any chest pain.  There were no significant ST-T wave changes.  Technetium 76m Sestamibi was injected IV at peak exercise and myocardial perfusion imaging was performed after a brief delay.  Transient Ischemic Dilatation (Normal <1.22):  0.74 Lung/Heart Ratio (Normal <0.45):  0.31 QGS EDV:  117 ml QGS ESV:  53 ml LV Ejection Fraction: 55%  Pam Vear Clock, CNMT  PHYSICIAN INTERPRETATION  Rest ECG: NSR - Normal EKG  Stress ECG: No significant change from baseline ECG, No significant ST segment change suggestive of ischemia. and There are scattered PACs. -- PACs noted during recovery with at least 2 noted to have prolonged compensatory pause. - no Sx.  QPS Raw Data Images:  Acquisition technically good; normal left ventricular size. Stress Images:  Normal homogeneous uptake in all areas of the myocardium. Rest Images:  Normal homogeneous uptake in all areas of the myocardium.  Comparison with the stress images reveals no significant change.  Subtraction (SDS):  There is no evidence of scar or ischemia.  Impression Exercise Capacity:  Excellent exercise capacity. BP Response:  Normal blood pressure response. Clinical Symptoms:  fatigue ECG Impression:  Insignificant upsloping ST segment depression. LV Wall Motion:  NL LV Function; NL Wall Motion Comparison with Prior Nuclear Study: No images to compare  Overall Impression:  Normal stress nuclear study.    Marykay Lex, MD  10/08/2013 12:46 PM

## 2013-10-11 ENCOUNTER — Encounter: Payer: Self-pay | Admitting: *Deleted

## 2013-10-11 ENCOUNTER — Encounter: Payer: Self-pay | Admitting: Physician Assistant

## 2013-10-11 ENCOUNTER — Ambulatory Visit (INDEPENDENT_AMBULATORY_CARE_PROVIDER_SITE_OTHER): Payer: BC Managed Care – PPO | Admitting: Physician Assistant

## 2013-10-11 VITALS — BP 129/82 | HR 62 | Ht 72.0 in | Wt 217.0 lb

## 2013-10-11 DIAGNOSIS — I428 Other cardiomyopathies: Secondary | ICD-10-CM

## 2013-10-11 DIAGNOSIS — I429 Cardiomyopathy, unspecified: Secondary | ICD-10-CM

## 2013-10-11 DIAGNOSIS — I4892 Unspecified atrial flutter: Secondary | ICD-10-CM

## 2013-10-11 NOTE — Assessment & Plan Note (Addendum)
Currently maintaining sinus bradycardia at a rate of 56 beats per minute mild first degree AV block. He will continue on eloquis released another two  Weeks,  Lopressor.  The patient is okay for him to go scuba diving.  He does not plan to go deeper than 80 feet.

## 2013-10-11 NOTE — Progress Notes (Signed)
Date:  10/11/2013   ID:  HOLMAN MARATEA, DOB [birth year], MRN [id number]  PCP:  Lupe Carney, MD  Primary Cardiologist:  Rennis Golden    History of Present Illness: Walter [name] is a 58 y.o. male with no significant past medical history. He was feeling unwell the night prior to admission(09/25/13), somewhat dizzy and pre-syncopal. He went for his morning workout 09/25/13 and noted that his pulse was very rapid. He ran into Dr. Riley Kill at the gym who listened to him and noted he was out of rhythm and sent him to urgent care. His EKG there initially suggested a STEMI with J point elevation and he was transported urgently to [facility] ER where STEMI was subsequently cancelled. He was placed on Heparin and Diltiazem. He was changed to Toprol after it was noted his HR was not well controlled when up. He underwent TEE CV 09/27/13 to NSR. His EF at TEE was mildly depressed at 45-50%. He was changed to Eliquis.   He will need uninterrupted anticoagulation for at least 4 weeks.  He underwent nuclear stress testing on April 10 which was normal and was also depicted normal LV function..  Patient presents today for a posthospital evaluation. He reports doing quite well and has started exercising again, which includes running in spin classes. He's had no difficulties.  The patient currently denies nausea, vomiting, fever, chest pain, shortness of breath, orthopnea, dizziness, PND, cough, congestion, abdominal pain, hematochezia, melena, lower extremity edema, claudication.  He is also planning a trip to [place] for some relatively shallow scuba diving.  [name] Readings from Last 3 Encounters:  10/11/13 217 lb (98.431 kg)  10/08/13 211 lb (95.709 kg)  09/25/13 211 lb 4.8 oz (95.845 kg)     History reviewed. No pertinent past medical history.  Current Outpatient Prescriptions  Medication Sig Dispense Refill  . apixaban (ELIQUIS) 5 MG TABS tablet Take 1 tablet (5 mg total) by mouth 2 (two) times daily.  60 tablet   11  . aspirin EC 81 MG EC tablet Take 1 tablet (81 mg total) by mouth daily.      . metoprolol succinate (TOPROL-XL) 25 MG 24 hr tablet Take 1 tablet (25 mg total) by mouth daily.  30 tablet  11  . Multiple Vitamin (MULTIVITAMIN WITH MINERALS) TABS tablet Take 1 tablet by mouth daily. [employer] over 50 vitamin packet      . Probiotic Product (PROBIOTIC ACIDOPHILUS BEADS PO) Take 1 capsule by mouth daily.      [name] terbinafine (LAMISIL) 250 MG tablet Take 250 mg by mouth daily. 7 day prescription       No current facility-administered medications for this visit.    Allergies:   No Known Allergies  Social History:  The patient  reports that he has never smoked. He has never used smokeless tobacco. He reports that he drinks alcohol. He reports that he does not use illicit drugs.   Family history:   Family History  Problem Relation Age of Onset  . Heart disease Mother   . Heart disease Father   . Healthy Sister   . Healthy Brother     ROS:  Please see the history of present illness.  All other systems reviewed and negative.   PHYSICAL EXAM: VS:  BP 129/82  Pulse 62  Ht 6' (1.829 m)  [name] 217 lb (98.431 kg)  BMI 29.42 kg/m2 Well nourished, well developed, in no acute distress HEENT: Pupils are equal round react to light  accommodation extraocular movements are intact.  Neck: no JVDNo cervical lymphadenopathy. Cardiac: Regular rate and rhythm without murmurs rubs or gallops. Lungs:  clear to auscultation bilaterally, no wheezing, rhonchi or rales  Ext: no lower extremity edema.  2+ radial and dorsalis pedis pulses. Skin: warm and dry Neuro:  Grossly normal  EKG:   Sinus bradycardia rate 56 beats per minute first degree AV block.  She [name] enlargement   ASSESSMENT AND PLAN:  Problem List Items Addressed This Visit   Atrial flutter - Primary     Currently maintaining sinus bradycardia at a rate of 56 beats per minute mild first degree AV block. He will continue on eloquis released  another two  Weeks,  Lopressor.  The patient is okay for him to go scuba diving.  He does not plan to go deeper than 80 feet.    Cardiomyopathy- etiology not yet determined- EF 45-50% at TEE     Well compensated. No signs of heart failure. Nuclear stress test indicates normal LV function.

## 2013-10-11 NOTE — Patient Instructions (Signed)
1.  Follow up with Dr. Hilty in 3 months. 

## 2013-10-11 NOTE — Addendum Note (Signed)
Addended by: Vincenza Hews. on: 10/11/2013 11:23 AM   Modules accepted: Orders

## 2013-10-11 NOTE — Assessment & Plan Note (Signed)
Well compensated. No signs of heart failure. Nuclear stress test indicates normal LV function.

## 2013-10-19 ENCOUNTER — Telehealth: Payer: Self-pay | Admitting: *Deleted

## 2013-10-19 NOTE — Telephone Encounter (Signed)
Pt would like a call back in regards to a-flutter while he was on vacation.   Walter [name]

## 2013-10-19 NOTE — Telephone Encounter (Signed)
Returned call to patient. He reports he had an episode of atrial flutter in March and was cardioverted by Dr. Jens Som. Since then, he was in [place] & [place] last week, and went back into a-flutter, reporting his HR in 130s-140s while walking around. He reports that he is presently in sinus rhythm and his HR is controlled (he states he monitors his pulse and rhythm) and is a avid runner and is apt to telling when his heart rate/rhythm is not normal. He is on a beta-blocker and eliquis. Patient reports he worked out this AM and his HR and rhythm maintained a normal rate and rhythm. Advised he continue to monitor rate/rhythm and notify us should he feel it go into atrial flutter again. Will forward message to Zena, [place] as FYI since he is the extender in office soonest and saw patient is hospital.

## 2013-10-19 NOTE — Telephone Encounter (Signed)
Message forwarded to Jenna, RN.  

## 2013-10-20 NOTE — Telephone Encounter (Signed)
If he goes into A flutter and it lasts more than 20 minutes he should take an extra Toprol 25 mg. Keep f/u appointment  .lk

## 2013-10-20 NOTE — Telephone Encounter (Signed)
I see he doesn't have an apt scheduled with Dr Rennis Golden yet- he should see Dr Rennis Golden in 4 weeks.  lk

## 2013-10-20 NOTE — Telephone Encounter (Signed)
Called patient with advice per Franky Macho, Cordelia Poche. Patient voiced understanding and agreed with plan. Will notify schedulers to get patient in to see Dr. Rennis Golden

## 2013-10-21 NOTE — Telephone Encounter (Signed)
Closed encounter °

## 2013-11-10 NOTE — Addendum Note (Signed)
Addended by: Barrie Dunker on: 11/10/2013 12:10 PM   Modules accepted: Orders

## 2013-11-18 ENCOUNTER — Ambulatory Visit: Payer: BC Managed Care – PPO | Admitting: Internal Medicine

## 2013-11-30 ENCOUNTER — Ambulatory Visit (INDEPENDENT_AMBULATORY_CARE_PROVIDER_SITE_OTHER): Payer: BC Managed Care – PPO | Admitting: Internal Medicine

## 2013-11-30 ENCOUNTER — Encounter: Payer: Self-pay | Admitting: Internal Medicine

## 2013-11-30 VITALS — BP 114/75 | HR 71 | Ht 72.0 in | Wt 219.8 lb

## 2013-11-30 DIAGNOSIS — I428 Other cardiomyopathies: Secondary | ICD-10-CM

## 2013-11-30 DIAGNOSIS — I429 Cardiomyopathy, unspecified: Secondary | ICD-10-CM

## 2013-11-30 DIAGNOSIS — R55 Syncope and collapse: Secondary | ICD-10-CM

## 2013-11-30 DIAGNOSIS — I4892 Unspecified atrial flutter: Secondary | ICD-10-CM

## 2013-11-30 NOTE — Progress Notes (Signed)
OFFICE NOTE  Chief Complaint:  Follow-up atrial flutter  Primary Care Physician: Lupe Carney, MD  HPI:  Walter [name] is a 58 y.o. male with no significant past medical history. He was feeling unwell the night prior to admission(09/25/13), somewhat dizzy and pre-syncopal. He went for his morning workout 09/25/13 and noted that his pulse was very rapid. He ran into Dr. Riley Kill at the gym who listened to him and noted he was out of rhythm and sent him to urgent care. His EKG there initially suggested a STEMI with J point elevation and he was transported urgently to [facility] ER where STEMI was subsequently cancelled. He was placed on Heparin and Diltiazem. He was changed to Toprol after it was noted his HR was not well controlled when up. He underwent TEE CV 09/27/13 to NSR. His EF at TEE was mildly depressed at 45-50%. He was changed to Eliquis. He will need uninterrupted anticoagulation for at least 4 weeks. He underwent nuclear stress testing on April 10 which was normal and was also depicted normal LV function.[name]   He was recently seen by Wilburt Finlay, PA-C, for posthospital evaluation. He reports doing quite well and has started exercising again, which includes running in spin classes. He's had no difficulties. The patient currently denies nausea, vomiting, fever, chest pain, shortness of breath, orthopnea, dizziness, PND, cough, congestion, abdominal pain, hematochezia, melena, lower extremity edema, claudication. He went on a trip to [place] for some relatively shallow scuba diving - unfortunately, he did not end up diving while he was here as he developed atrial flutter again. He says he may been in the rhythm for 48 hours. This spontaneously went away. He is also mentioned that he has missed some doses available this and taken a double dose which I advised against.  He reports he may have had more alcohol than typical which could have triggered the symptoms.  He is otherwise return to  physical activity and is active without any complaints. He exercises every day.  PMHx:  History reviewed. No pertinent past medical history.  Past Surgical History  Procedure Laterality Date  . Retinal detachment surgery    . Wrist surgery    . Tee without cardioversion N/A 09/27/2013    Procedure: TRANSESOPHAGEAL ECHOCARDIOGRAM (TEE);  Surgeon: Lewayne Bunting, MD;  Location: [facility] ENDOSCOPY;  Service: Cardiovascular;  Laterality: N/A;  . Cardioversion N/A 09/27/2013    Procedure: CARDIOVERSION;  Surgeon: Lewayne Bunting, MD;  Location: [facility] ENDOSCOPY;  Service: Cardiovascular;  Laterality: N/A;    FAMHx:  Family History  Problem Relation Age of Onset  . Heart disease Mother   . Heart disease Father   . Healthy Sister   . Healthy Brother     SOCHx:   reports that he has never smoked. He has never used smokeless tobacco. He reports that he drinks alcohol. He reports that he does not use illicit drugs.  ALLERGIES:  No Known Allergies  ROS: A comprehensive review of systems was negative except for: Cardiovascular: positive for palpitations  HOME MEDS: Current Outpatient Prescriptions  Medication Sig Dispense Refill  . aspirin EC 81 MG EC tablet Take 1 tablet (81 mg total) by mouth daily.      . metoprolol succinate (TOPROL-XL) 25 MG 24 hr tablet Take 1 tablet (25 mg total) by mouth daily.  30 tablet  11  . Multiple Vitamin (MULTIVITAMIN WITH MINERALS) TABS tablet Take 1 tablet by mouth daily. [employer] over 50 vitamin packet      .  Probiotic Product (PROBIOTIC ACIDOPHILUS BEADS PO) Take 1 capsule by mouth daily.      [name]. terbinafine (LAMISIL) 250 MG tablet Take 250 mg by mouth daily. 7 day prescription       No current facility-administered medications for this visit.    LABS/IMAGING: No results found for this or any previous visit (from the past 48 hour(s)). No results found.  VITALS: BP 114/75  Pulse 71  Ht 6' (1.829 m)  Wt 219 lb 12.8 oz (99.701 kg)  BMI 29.80  kg/m2  EXAM: deferred  EKG: Normal sinus rhythm at 71  ASSESSMENT: 1. Paroxysmal atrial flutter 2. Low normal EF of 50%, possibly related to flutter, negative nuclear stress test  PLAN: 1.   [name] has paroxysmal atrial flutter and had one small break through while on vacation possibly related to alcohol use. He has felt well over the past several weeks without recurrence. He is now been on Eliquis over one month and I would recommend discontinuing that and going on aspirin monotherapy. He should remain on low-dose beta blocker. Given his breakthrough and the fact that he was symptomatic, I think he should consider catheter ablation. There is a high likelihood that he could become free of recurrent arrhythmias and given his lifestyle and physical activity, this may be a better option for him. Another option would be a pocket pill strategy. I will like to refer her to Dr. Johney FrameAllred with cardiac electrophysiology to review his options. Plan to see him back annually.  Chrystie NoseKenneth C. Sandrina Heaton, MD, [facility] Attending Cardiologist [facility]  Chrystie NoseKenneth C. Tequlia Gonsalves 11/30/2013, 9:17 AM

## 2013-11-30 NOTE — Patient Instructions (Signed)
You have been referred to Dr. Hillis Range - at our [facility] office (near [facility])  Your physician has recommended you make the following change in your medication: STOP eliquis.   Your physician wants you to follow-up in: 1 year. You will receive a reminder letter in the mail two months in advance. If you don't receive a letter, please call our office to schedule the follow-up appointment.

## 2013-12-22 ENCOUNTER — Other Ambulatory Visit: Payer: Self-pay

## 2013-12-24 ENCOUNTER — Institutional Professional Consult (permissible substitution): Payer: BC Managed Care – PPO | Admitting: Internal Medicine

## 2013-12-27 ENCOUNTER — Encounter: Payer: Self-pay | Admitting: Internal Medicine

## 2013-12-27 ENCOUNTER — Ambulatory Visit (INDEPENDENT_AMBULATORY_CARE_PROVIDER_SITE_OTHER): Payer: BC Managed Care – PPO | Admitting: Internal Medicine

## 2013-12-27 VITALS — BP 128/74 | HR 61 | Ht 71.0 in | Wt 223.0 lb

## 2013-12-27 DIAGNOSIS — I4892 Unspecified atrial flutter: Secondary | ICD-10-CM

## 2013-12-27 NOTE — Progress Notes (Signed)
Primary Care Physician: Lupe CarneyMITCHELL,DEAN, MD Referring Physician:  Dr Darlyne RussianHilty  Walter [name] is a 58 y.o. male with a h/o atrial flutter who presents for EP consultation.  He reports that he was initially diagnosed with atrial flutter 09/25/13.  He noted abrupt decrease in exercise tolerance on the treadmill and noted that his pulse was very rapid.  [facility] and had typical appearing atrial flutter documented.  He was placed on eliquis and underwent cardioversion.  He is unaware of any exact triggers/ precipitants for this episode. He had his second episode of atrial flutter was 2 weeks later.  He had had several glasses of ETOH and then had tachypalpitations for 2 days before it subsided on its own.  He has reduced caffeine and ETOh and has had no further episodes.  He has been placed on metoprolol and is tolerating this without symptoms.    Today, he denies symptoms of chest pain, shortness of breath, orthopnea, PND, lower extremity edema, dizziness, presyncope, syncope, or neurologic sequela. The patient is tolerating medications without difficulties and is otherwise without complaint today.   Past Medical History  Diagnosis Date  . Atrial flutter   . Near syncope 09/25/13  . Dizzy 09/25/13   Past Surgical History  Procedure Laterality Date  . Retinal detachment surgery    . Wrist surgery    . Tee without cardioversion N/A 09/27/2013    Procedure: TRANSESOPHAGEAL ECHOCARDIOGRAM (TEE);  Surgeon: Lewayne BuntingBrian S Crenshaw, MD;  [facility];  Service: Cardiovascular;  Laterality: N/A;  . Cardioversion N/A 09/27/2013    Procedure: CARDIOVERSION;  Surgeon: Lewayne BuntingBrian S Crenshaw, MD;  [facility];  Service: Cardiovascular;  Laterality: N/A;  . Right knee arthroscopy      Current Outpatient Prescriptions  Medication Sig Dispense Refill  . aspirin EC 81 MG EC tablet Take 1 tablet (81 mg total) by mouth daily.      . metoprolol succinate (TOPROL-XL) 25 MG 24 hr tablet Take 1  tablet (25 mg total) by mouth daily.  30 tablet  11   No current facility-administered medications for this visit.    No Known Allergies  History   Social History  . Marital Status: Married    Spouse Name: N/A    Number of Children: N/A  . Years of Education: N/A   Occupational History  . Not on file.   Social History Main Topics  . Smoking status: Never Smoker   . Smokeless tobacco: Never Used  . Alcohol Use: Yes     Comment: occasionally  . Drug Use: No  . Sexual Activity: Not on file   Other Topics Concern  . Not on file   Social History Narrative   [place] lives in [place].  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 Heart disease Mother   . Heart disease Father   . Healthy Sister   . Healthy Brother     ROS- All systems are reviewed and negative except as per the HPI above  Physical Exam: Filed Vitals:   12/27/13 1411  BP: 128/74  Pulse: 61  Height: 5\' 11"  (1.803 m)  Weight: 223 lb (101.152 kg)    GEN- The patient is well appearing, alert and oriented x 3 today.   Head- normocephalic, atraumatic Eyes-  Sclera clear, conjunctiva pink Ears- hearing intact Oropharynx- clear Neck- supple, no JVP Lymph- no cervical lymphadenopathy  Lungs- Clear to ausculation bilaterally, normal work of breathing Heart- Regular rate and rhythm, no murmurs, rubs or gallops, PMI not laterally displaced GI- soft, NT, ND, + BS Extremities- no clubbing, cyanosis, or edema MS- no significant deformity or atrophy Skin- no rash or lesion Psych- euthymic mood, full affect Neuro- strength and sensation are intact  EKG 09/25/13- typical appearing atrial flutter Epic records including Dr Andrez Grime notes are reviewed TEE is reviewed  Assessment and Plan:  1. Atrial flutter The patient presents for EP consult regarding typical appearing symptomatic and recurrent atrial flutter. His CHAD2VASC score is  0.  He therefore does not require anticoagulation at this time. Therapeutic strategies for atrial flutter including medicine and ablation were discussed in detail with the patient today. Risk, benefits, and alternatives to EP study and radiofrequency ablation were also discussed in detail today.  I do think that he is a good candidate for ablation. At this time however he would prefer a strategy of watchful waiting.  If he has more atrial arrhythmias then he may be more willing to consider ablation. At this time, he is tolerating metoprolol and I have made no changes.  I think that it would also be reasonable to take metoprolol as needed.  We spent significant time today discussing lifestyle modification including avoidance of ETOH and stress management.  I will work on this going forward.  He says that if he has recurrence of atrial flutter then he would be willing to consider ablation then.  He will continue current regimen until then.  I will see him in 6 months for follow-up He will follow-up with Dr Rennis Golden as scheduled. I think that we should repeat his Echo in follow-up given reduced EF by TEE previously (likely due to atrial flutter with RVR).

## 2013-12-27 NOTE — Patient Instructions (Signed)
Your physician wants you to follow-up in: 6 months with Dr. Allred. You will receive a reminder letter in the mail two months in advance. If you don't receive a letter, please call our office to schedule the follow-up appointment.  

## 2013-12-29 NOTE — Telephone Encounter (Signed)
Encounter complete. 

## 2015-07-31 ENCOUNTER — Ambulatory Visit (INDEPENDENT_AMBULATORY_CARE_PROVIDER_SITE_OTHER): Payer: BLUE CROSS/BLUE SHIELD | Admitting: Internal Medicine

## 2015-07-31 ENCOUNTER — Encounter: Payer: Self-pay | Admitting: Internal Medicine

## 2015-07-31 VITALS — BP 128/78 | HR 67 | Ht 72.0 in | Wt 228.2 lb

## 2015-07-31 DIAGNOSIS — I483 Typical atrial flutter: Secondary | ICD-10-CM | POA: Diagnosis not present

## 2015-07-31 DIAGNOSIS — I429 Cardiomyopathy, unspecified: Secondary | ICD-10-CM | POA: Diagnosis not present

## 2015-07-31 DIAGNOSIS — I4892 Unspecified atrial flutter: Secondary | ICD-10-CM

## 2015-07-31 MED ORDER — DILTIAZEM HCL 30 MG PO TABS: ORAL_TABLET | ORAL | Status: DC

## 2015-07-31 NOTE — Progress Notes (Signed)
Primary Care Physician: Lupe Carney, MD Referring Physician:  Dr Darlyne Russian is a 60 y.o. male with a h/o atrial flutter who presents for EP follow-up.  He states that he has done well over the past year without any symptoms of arrhythmia.  Pleased with current health state.  Has modified caffeine but continues to drink 2 glasses of wine most days.  Has not had much luck with weight reduction but remains active.   Today, he denies symptoms of chest pain, shortness of breath, orthopnea, PND, lower extremity edema, dizziness, presyncope, syncope, or neurologic sequela. The patient is tolerating medications without difficulties and is otherwise without complaint today.   Past Medical History  Diagnosis Date  . Atrial flutter (HCC)   . Near syncope 09/25/13  . Dizzy 09/25/13   Past Surgical History  Procedure Laterality Date  . Retinal detachment surgery    . Wrist surgery    . Tee without cardioversion N/A 09/27/2013    Procedure: TRANSESOPHAGEAL ECHOCARDIOGRAM (TEE);  Surgeon: Lewayne Bunting, MD;  Location: [facility] ENDOSCOPY;  Service: Cardiovascular;  Laterality: N/A;  . Cardioversion N/A 09/27/2013    Procedure: CARDIOVERSION;  Surgeon: Lewayne Bunting, MD;  Location: [facility] ENDOSCOPY;  Service: Cardiovascular;  Laterality: N/A;  . Right knee arthroscopy      Current Outpatient Prescriptions  Medication Sig Dispense Refill  . aspirin EC 81 MG EC tablet Take 1 tablet (81 mg total) by mouth daily.    [name] diltiazem (CARDIZEM) 30 MG tablet Take 1-2 tablets every 6 hours as needed for fast heart rates 30 tablet 1   No current facility-administered medications for this visit.    No Known Allergies  Social History   Social History  . Marital Status: Married    Spouse Name: N/A  . Number of Children: N/A  . Years of Education: N/A   Occupational History  . Not on file.   Social History Main Topics  . Smoking status: Never Smoker   . Smokeless tobacco: Never Used  . Alcohol  Use: Yes     Comment: occasionally  . Drug Use: No  . Sexual Activity: Not on file   Other Topics Concern  . Not on file   Social History Narrative   Pt lives in [place].  Works as a Copywriter, advertising for [employer].   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 Heart disease Mother   . Heart disease Father   . Healthy Sister   . Healthy Brother     ROS- All systems are reviewed and negative except as per the HPI above  Physical Exam: Filed Vitals:   07/31/15 1536  BP: 128/78  Pulse: 67  Height: 6' (1.829 m)  Weight: 228 lb 3.2 oz (103.511 kg)    GEN- The patient is well appearing, alert and oriented x 3 today.   Head- normocephalic, atraumatic Eyes-  Sclera clear, conjunctiva pink Ears- hearing intact Oropharynx- clear Neck- supple  Lungs- Clear to ausculation bilaterally, normal work of breathing Heart- Regular rate and rhythm, no murmurs, rubs or gallops, PMI not laterally displaced GI- soft, NT, ND, + BS Extremities- no clubbing, cyanosis, or edema MS- no significant deformity or atrophy Skin- no rash or lesion Psych- euthymic mood, full affect Neuro- strength and sensation are intact  EKG today reveals sinus rhythm, normal ekg  Assessment and Plan:  1. Atrial flutter No symptoms of atrial flutter (typical) in over a  year.  His CHAD2VASC score is 0.  He therefore does not require anticoagulation at this time. He would prefer to continue watchful waiting and lifestyle change at this time.  He does not wish to consider ablation. I have given cardizem 30mg  tablets which he can use prn.  2. Overweight Body mass index is 30.94 kg/(m^2). Lifestyle modification discussed today  Check fasting lipids  3. EF 450-50% by prior TEE No chf symptoms Likely reduced by acute atrial flutter at the time Should probably repeat 2 d echo upon return  Return in 1 year unless problems arise in the interim.

## 2015-07-31 NOTE — Patient Instructions (Addendum)
Medication Instructions:  1) Take Cardizem 30 mg 1-2 tablets every 6 hours as needed for fast heart rates    Labwork: Your physician recommends that you return for lab work fasting lipids     Testing/Procedures: None ordered   Follow-Up: Your physician wants you to follow-up in: 12 months with Dr Johney Frame Bonita Quin will receive a reminder letter in the mail two months in advance. If you don't receive a letter, please call our office to schedule the follow-up appointment.   Any Other Special Instructions Will Be Listed Below (If Applicable).     If you need a refill on your cardiac medications before your next appointment, please call your pharmacy.

## 2015-08-21 ENCOUNTER — Other Ambulatory Visit (INDEPENDENT_AMBULATORY_CARE_PROVIDER_SITE_OTHER): Payer: BLUE CROSS/BLUE SHIELD | Admitting: *Deleted

## 2015-08-21 DIAGNOSIS — I429 Cardiomyopathy, unspecified: Secondary | ICD-10-CM

## 2015-08-21 DIAGNOSIS — I4892 Unspecified atrial flutter: Secondary | ICD-10-CM

## 2015-08-21 LAB — LIPID PANEL
Cholesterol: 173 mg/dL (ref 125–200)
HDL: 54 mg/dL (ref >=40)
LDL Cholesterol: 105 mg/dL (ref <130)
Total CHOL/HDL Ratio: 3.2 Ratio (ref <=5.0)
Triglycerides: 69 mg/dL (ref <150)
VLDL: 14 mg/dL (ref <30)

## 2015-10-06 DIAGNOSIS — J3089 Other allergic rhinitis: Secondary | ICD-10-CM | POA: Diagnosis not present

## 2015-10-06 DIAGNOSIS — J3081 Allergic rhinitis due to animal (cat) (dog) hair and dander: Secondary | ICD-10-CM | POA: Diagnosis not present

## 2015-10-06 DIAGNOSIS — J301 Allergic rhinitis due to pollen: Secondary | ICD-10-CM | POA: Diagnosis not present

## 2015-10-12 DIAGNOSIS — J3089 Other allergic rhinitis: Secondary | ICD-10-CM | POA: Diagnosis not present

## 2015-10-12 DIAGNOSIS — J3081 Allergic rhinitis due to animal (cat) (dog) hair and dander: Secondary | ICD-10-CM | POA: Diagnosis not present

## 2015-10-12 DIAGNOSIS — J301 Allergic rhinitis due to pollen: Secondary | ICD-10-CM | POA: Diagnosis not present

## 2015-10-17 DIAGNOSIS — J301 Allergic rhinitis due to pollen: Secondary | ICD-10-CM | POA: Diagnosis not present

## 2015-10-17 DIAGNOSIS — J3081 Allergic rhinitis due to animal (cat) (dog) hair and dander: Secondary | ICD-10-CM | POA: Diagnosis not present

## 2015-10-17 DIAGNOSIS — J3089 Other allergic rhinitis: Secondary | ICD-10-CM | POA: Diagnosis not present

## 2015-10-26 DIAGNOSIS — J301 Allergic rhinitis due to pollen: Secondary | ICD-10-CM | POA: Diagnosis not present

## 2015-10-26 DIAGNOSIS — J3081 Allergic rhinitis due to animal (cat) (dog) hair and dander: Secondary | ICD-10-CM | POA: Diagnosis not present

## 2015-10-26 DIAGNOSIS — J3089 Other allergic rhinitis: Secondary | ICD-10-CM | POA: Diagnosis not present

## 2015-11-03 DIAGNOSIS — J3089 Other allergic rhinitis: Secondary | ICD-10-CM | POA: Diagnosis not present

## 2015-11-03 DIAGNOSIS — J301 Allergic rhinitis due to pollen: Secondary | ICD-10-CM | POA: Diagnosis not present

## 2015-11-03 DIAGNOSIS — J3081 Allergic rhinitis due to animal (cat) (dog) hair and dander: Secondary | ICD-10-CM | POA: Diagnosis not present

## 2015-11-07 DIAGNOSIS — J3089 Other allergic rhinitis: Secondary | ICD-10-CM | POA: Diagnosis not present

## 2015-11-07 DIAGNOSIS — J3081 Allergic rhinitis due to animal (cat) (dog) hair and dander: Secondary | ICD-10-CM | POA: Diagnosis not present

## 2015-11-07 DIAGNOSIS — J301 Allergic rhinitis due to pollen: Secondary | ICD-10-CM | POA: Diagnosis not present

## 2015-11-15 DIAGNOSIS — J301 Allergic rhinitis due to pollen: Secondary | ICD-10-CM | POA: Diagnosis not present

## 2015-11-15 DIAGNOSIS — J3089 Other allergic rhinitis: Secondary | ICD-10-CM | POA: Diagnosis not present

## 2015-11-15 DIAGNOSIS — J3081 Allergic rhinitis due to animal (cat) (dog) hair and dander: Secondary | ICD-10-CM | POA: Diagnosis not present

## 2015-11-24 DIAGNOSIS — J301 Allergic rhinitis due to pollen: Secondary | ICD-10-CM | POA: Diagnosis not present

## 2015-11-24 DIAGNOSIS — J3081 Allergic rhinitis due to animal (cat) (dog) hair and dander: Secondary | ICD-10-CM | POA: Diagnosis not present

## 2015-11-24 DIAGNOSIS — J3089 Other allergic rhinitis: Secondary | ICD-10-CM | POA: Diagnosis not present

## 2015-11-30 DIAGNOSIS — J3081 Allergic rhinitis due to animal (cat) (dog) hair and dander: Secondary | ICD-10-CM | POA: Diagnosis not present

## 2015-11-30 DIAGNOSIS — J301 Allergic rhinitis due to pollen: Secondary | ICD-10-CM | POA: Diagnosis not present

## 2015-11-30 DIAGNOSIS — J3089 Other allergic rhinitis: Secondary | ICD-10-CM | POA: Diagnosis not present

## 2015-12-06 DIAGNOSIS — J301 Allergic rhinitis due to pollen: Secondary | ICD-10-CM | POA: Diagnosis not present

## 2015-12-06 DIAGNOSIS — J3089 Other allergic rhinitis: Secondary | ICD-10-CM | POA: Diagnosis not present

## 2015-12-06 DIAGNOSIS — J3081 Allergic rhinitis due to animal (cat) (dog) hair and dander: Secondary | ICD-10-CM | POA: Diagnosis not present

## 2015-12-13 DIAGNOSIS — J301 Allergic rhinitis due to pollen: Secondary | ICD-10-CM | POA: Diagnosis not present

## 2015-12-13 DIAGNOSIS — J3081 Allergic rhinitis due to animal (cat) (dog) hair and dander: Secondary | ICD-10-CM | POA: Diagnosis not present

## 2015-12-13 DIAGNOSIS — J3089 Other allergic rhinitis: Secondary | ICD-10-CM | POA: Diagnosis not present

## 2015-12-28 DIAGNOSIS — K210 Gastro-esophageal reflux disease with esophagitis: Secondary | ICD-10-CM | POA: Diagnosis not present

## 2015-12-28 DIAGNOSIS — K449 Diaphragmatic hernia without obstruction or gangrene: Secondary | ICD-10-CM | POA: Diagnosis not present

## 2015-12-28 DIAGNOSIS — K222 Esophageal obstruction: Secondary | ICD-10-CM | POA: Diagnosis not present

## 2015-12-28 DIAGNOSIS — R1310 Dysphagia, unspecified: Secondary | ICD-10-CM | POA: Diagnosis not present

## 2016-01-15 DIAGNOSIS — J3089 Other allergic rhinitis: Secondary | ICD-10-CM | POA: Diagnosis not present

## 2016-01-15 DIAGNOSIS — J3081 Allergic rhinitis due to animal (cat) (dog) hair and dander: Secondary | ICD-10-CM | POA: Diagnosis not present

## 2016-01-15 DIAGNOSIS — J301 Allergic rhinitis due to pollen: Secondary | ICD-10-CM | POA: Diagnosis not present

## 2016-01-25 DIAGNOSIS — J3081 Allergic rhinitis due to animal (cat) (dog) hair and dander: Secondary | ICD-10-CM | POA: Diagnosis not present

## 2016-01-25 DIAGNOSIS — J3089 Other allergic rhinitis: Secondary | ICD-10-CM | POA: Diagnosis not present

## 2016-01-25 DIAGNOSIS — J301 Allergic rhinitis due to pollen: Secondary | ICD-10-CM | POA: Diagnosis not present

## 2016-01-31 DIAGNOSIS — J301 Allergic rhinitis due to pollen: Secondary | ICD-10-CM | POA: Diagnosis not present

## 2016-01-31 DIAGNOSIS — J3089 Other allergic rhinitis: Secondary | ICD-10-CM | POA: Diagnosis not present

## 2016-01-31 DIAGNOSIS — J3081 Allergic rhinitis due to animal (cat) (dog) hair and dander: Secondary | ICD-10-CM | POA: Diagnosis not present

## 2016-02-20 DIAGNOSIS — J3089 Other allergic rhinitis: Secondary | ICD-10-CM | POA: Diagnosis not present

## 2016-02-20 DIAGNOSIS — J3081 Allergic rhinitis due to animal (cat) (dog) hair and dander: Secondary | ICD-10-CM | POA: Diagnosis not present

## 2016-02-20 DIAGNOSIS — J301 Allergic rhinitis due to pollen: Secondary | ICD-10-CM | POA: Diagnosis not present

## 2016-02-23 DIAGNOSIS — J3081 Allergic rhinitis due to animal (cat) (dog) hair and dander: Secondary | ICD-10-CM | POA: Diagnosis not present

## 2016-02-23 DIAGNOSIS — J301 Allergic rhinitis due to pollen: Secondary | ICD-10-CM | POA: Diagnosis not present

## 2016-02-26 DIAGNOSIS — J3089 Other allergic rhinitis: Secondary | ICD-10-CM | POA: Diagnosis not present

## 2016-02-29 DIAGNOSIS — J3089 Other allergic rhinitis: Secondary | ICD-10-CM | POA: Diagnosis not present

## 2016-02-29 DIAGNOSIS — J3081 Allergic rhinitis due to animal (cat) (dog) hair and dander: Secondary | ICD-10-CM | POA: Diagnosis not present

## 2016-02-29 DIAGNOSIS — J301 Allergic rhinitis due to pollen: Secondary | ICD-10-CM | POA: Diagnosis not present

## 2016-03-15 DIAGNOSIS — J3081 Allergic rhinitis due to animal (cat) (dog) hair and dander: Secondary | ICD-10-CM | POA: Diagnosis not present

## 2016-03-15 DIAGNOSIS — J3089 Other allergic rhinitis: Secondary | ICD-10-CM | POA: Diagnosis not present

## 2016-03-15 DIAGNOSIS — J301 Allergic rhinitis due to pollen: Secondary | ICD-10-CM | POA: Diagnosis not present

## 2016-03-21 DIAGNOSIS — J3081 Allergic rhinitis due to animal (cat) (dog) hair and dander: Secondary | ICD-10-CM | POA: Diagnosis not present

## 2016-03-21 DIAGNOSIS — J301 Allergic rhinitis due to pollen: Secondary | ICD-10-CM | POA: Diagnosis not present

## 2016-03-21 DIAGNOSIS — J3089 Other allergic rhinitis: Secondary | ICD-10-CM | POA: Diagnosis not present

## 2016-03-28 DIAGNOSIS — J3081 Allergic rhinitis due to animal (cat) (dog) hair and dander: Secondary | ICD-10-CM | POA: Diagnosis not present

## 2016-03-28 DIAGNOSIS — J3089 Other allergic rhinitis: Secondary | ICD-10-CM | POA: Diagnosis not present

## 2016-03-28 DIAGNOSIS — J301 Allergic rhinitis due to pollen: Secondary | ICD-10-CM | POA: Diagnosis not present

## 2016-04-11 DIAGNOSIS — M7662 Achilles tendinitis, left leg: Secondary | ICD-10-CM | POA: Diagnosis not present

## 2016-04-11 DIAGNOSIS — M722 Plantar fascial fibromatosis: Secondary | ICD-10-CM | POA: Diagnosis not present

## 2016-04-11 DIAGNOSIS — M9906 Segmental and somatic dysfunction of lower extremity: Secondary | ICD-10-CM | POA: Diagnosis not present

## 2016-04-15 ENCOUNTER — Ambulatory Visit (INDEPENDENT_AMBULATORY_CARE_PROVIDER_SITE_OTHER): Payer: BLUE CROSS/BLUE SHIELD | Admitting: Orthopedic Surgery

## 2016-04-15 DIAGNOSIS — H1013 Acute atopic conjunctivitis, bilateral: Secondary | ICD-10-CM | POA: Diagnosis not present

## 2016-04-15 DIAGNOSIS — M76821 Posterior tibial tendinitis, right leg: Secondary | ICD-10-CM | POA: Diagnosis not present

## 2016-05-02 ENCOUNTER — Ambulatory Visit (INDEPENDENT_AMBULATORY_CARE_PROVIDER_SITE_OTHER): Payer: BLUE CROSS/BLUE SHIELD | Admitting: Orthopedic Surgery

## 2016-05-28 DIAGNOSIS — J3089 Other allergic rhinitis: Secondary | ICD-10-CM | POA: Diagnosis not present

## 2016-05-28 DIAGNOSIS — J301 Allergic rhinitis due to pollen: Secondary | ICD-10-CM | POA: Diagnosis not present

## 2016-05-28 DIAGNOSIS — J3081 Allergic rhinitis due to animal (cat) (dog) hair and dander: Secondary | ICD-10-CM | POA: Diagnosis not present

## 2016-05-31 DIAGNOSIS — J3081 Allergic rhinitis due to animal (cat) (dog) hair and dander: Secondary | ICD-10-CM | POA: Diagnosis not present

## 2016-05-31 DIAGNOSIS — J301 Allergic rhinitis due to pollen: Secondary | ICD-10-CM | POA: Diagnosis not present

## 2016-05-31 DIAGNOSIS — J3089 Other allergic rhinitis: Secondary | ICD-10-CM | POA: Diagnosis not present

## 2016-06-06 DIAGNOSIS — J301 Allergic rhinitis due to pollen: Secondary | ICD-10-CM | POA: Diagnosis not present

## 2016-06-06 DIAGNOSIS — J3081 Allergic rhinitis due to animal (cat) (dog) hair and dander: Secondary | ICD-10-CM | POA: Diagnosis not present

## 2016-06-06 DIAGNOSIS — J3089 Other allergic rhinitis: Secondary | ICD-10-CM | POA: Diagnosis not present

## 2016-06-12 DIAGNOSIS — J3089 Other allergic rhinitis: Secondary | ICD-10-CM | POA: Diagnosis not present

## 2016-06-12 DIAGNOSIS — J301 Allergic rhinitis due to pollen: Secondary | ICD-10-CM | POA: Diagnosis not present

## 2016-06-12 DIAGNOSIS — J3081 Allergic rhinitis due to animal (cat) (dog) hair and dander: Secondary | ICD-10-CM | POA: Diagnosis not present

## 2016-06-20 DIAGNOSIS — J3081 Allergic rhinitis due to animal (cat) (dog) hair and dander: Secondary | ICD-10-CM | POA: Diagnosis not present

## 2016-06-20 DIAGNOSIS — J301 Allergic rhinitis due to pollen: Secondary | ICD-10-CM | POA: Diagnosis not present

## 2016-06-20 DIAGNOSIS — J3089 Other allergic rhinitis: Secondary | ICD-10-CM | POA: Diagnosis not present

## 2016-06-27 DIAGNOSIS — J3089 Other allergic rhinitis: Secondary | ICD-10-CM | POA: Diagnosis not present

## 2016-06-27 DIAGNOSIS — J3081 Allergic rhinitis due to animal (cat) (dog) hair and dander: Secondary | ICD-10-CM | POA: Diagnosis not present

## 2016-06-27 DIAGNOSIS — J301 Allergic rhinitis due to pollen: Secondary | ICD-10-CM | POA: Diagnosis not present

## 2016-07-04 DIAGNOSIS — J3081 Allergic rhinitis due to animal (cat) (dog) hair and dander: Secondary | ICD-10-CM | POA: Diagnosis not present

## 2016-07-04 DIAGNOSIS — J3089 Other allergic rhinitis: Secondary | ICD-10-CM | POA: Diagnosis not present

## 2016-07-04 DIAGNOSIS — J301 Allergic rhinitis due to pollen: Secondary | ICD-10-CM | POA: Diagnosis not present

## 2016-07-12 DIAGNOSIS — J301 Allergic rhinitis due to pollen: Secondary | ICD-10-CM | POA: Diagnosis not present

## 2016-07-12 DIAGNOSIS — J3089 Other allergic rhinitis: Secondary | ICD-10-CM | POA: Diagnosis not present

## 2016-07-12 DIAGNOSIS — J3081 Allergic rhinitis due to animal (cat) (dog) hair and dander: Secondary | ICD-10-CM | POA: Diagnosis not present

## 2016-07-26 DIAGNOSIS — J301 Allergic rhinitis due to pollen: Secondary | ICD-10-CM | POA: Diagnosis not present

## 2016-07-26 DIAGNOSIS — J3081 Allergic rhinitis due to animal (cat) (dog) hair and dander: Secondary | ICD-10-CM | POA: Diagnosis not present

## 2016-07-26 DIAGNOSIS — J3089 Other allergic rhinitis: Secondary | ICD-10-CM | POA: Diagnosis not present

## 2016-07-31 ENCOUNTER — Encounter: Payer: Self-pay | Admitting: Internal Medicine

## 2016-07-31 ENCOUNTER — Ambulatory Visit (INDEPENDENT_AMBULATORY_CARE_PROVIDER_SITE_OTHER): Payer: BLUE CROSS/BLUE SHIELD | Admitting: Internal Medicine

## 2016-07-31 VITALS — BP 120/90 | HR 67 | Ht 71.0 in | Wt 232.0 lb

## 2016-07-31 DIAGNOSIS — I483 Typical atrial flutter: Secondary | ICD-10-CM

## 2016-07-31 DIAGNOSIS — I429 Cardiomyopathy, unspecified: Secondary | ICD-10-CM | POA: Diagnosis not present

## 2016-07-31 MED ORDER — DILTIAZEM HCL 30 MG PO TABS: ORAL_TABLET | ORAL | 1 refills | Status: DC

## 2016-07-31 NOTE — Patient Instructions (Signed)
Medication Instructions:  Your physician recommends that you continue on your current medications as directed. Please refer to the Current Medication list given to you today.   Labwork: None ordered   Testing/Procedures: Your physician has requested that you have an echocardiogram. Echocardiography is a painless test that uses sound waves to create images of your heart. It provides your doctor with information about the size and shape of your heart and how well your heart's chambers and valves are working. This procedure takes approximately one hour. There are no restrictions for this procedure.     Follow-Up: Your physician wants you to follow-up in: 12 months with Dr Allred You will receive a reminder letter in the mail two months in advance. If you don't receive a letter, please call our office to schedule the follow-up appointment.   Any Other Special Instructions Will Be Listed Below (If Applicable).     If you need a refill on your cardiac medications before your next appointment, please call your pharmacy.   

## 2016-07-31 NOTE — Progress Notes (Signed)
Primary Care Physician: Lupe Carney, MD  Walter [name] is a 61 y.o. male with a h/o atrial flutter who presents for EP follow-up.  He states that he has done well over the past year without any symptoms of arrhythmia.  Pleased with current health state.  Has modified caffeine but continues to drink occasional alcohol.  Has not had much luck with weight reduction but remains active.  He is planning a 28 day trip around the world starting next week.  Today, he denies symptoms of chest pain, shortness of breath, orthopnea, PND, lower extremity edema, dizziness, presyncope, syncope, or neurologic sequela. The patient is tolerating medications without difficulties and is otherwise without complaint today.   Past Medical History:  Diagnosis Date  . Atrial flutter (HCC)   . Dizzy 09/25/13  . Near syncope 09/25/13   Past Surgical History:  Procedure Laterality Date  . CARDIOVERSION N/A 09/27/2013   Procedure: CARDIOVERSION;  Surgeon: Lewayne Bunting, MD;  Location: [facility] ENDOSCOPY;  Service: Cardiovascular;  Laterality: N/A;  . RETINAL DETACHMENT SURGERY    . right knee arthroscopy    . TEE WITHOUT CARDIOVERSION N/A 09/27/2013   Procedure: TRANSESOPHAGEAL ECHOCARDIOGRAM (TEE);  Surgeon: Lewayne Bunting, MD;  Location: [facility] ENDOSCOPY;  Service: Cardiovascular;  Laterality: N/A;  . WRIST SURGERY      Current Outpatient Prescriptions  Medication Sig Dispense Refill  . aspirin EC 81 MG EC tablet Take 1 tablet (81 mg total) by mouth daily.    [name] diltiazem (CARDIZEM) 30 MG tablet Take 1-2 tablets every 6 hours as needed for fast heart rates 30 tablet 1   No current facility-administered medications for this visit.     No Known Allergies  Social History   Social History  . Marital status: Married    Spouse name: N/A  . Number of children: N/A  . Years of education: N/A   Occupational History  . Not on file.   Social History Main Topics  . Smoking status: Never Smoker  . Smokeless  tobacco: Never Used  . Alcohol use Yes     Comment: occasionally  . Drug use: No  . Sexual activity: Not on file   Other Topics Concern  . Not on file   Social History Narrative   Pt lives in [place].  Works as a Copywriter, advertising for [employer].   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 Heart disease Mother   . Heart disease Father   . Healthy Sister   . Healthy Brother     ROS- All systems are reviewed and negative except as per the HPI above  Physical Exam: Vitals:   07/31/16 0937  BP: 120/90  Pulse: 67  SpO2: 96%  Weight: 232 lb (105.2 kg)  Height: 5\' 11"  (1.803 m)    GEN- The patient is well appearing, alert and oriented x 3 today.   Head- normocephalic, atraumatic Eyes-  Sclera clear, conjunctiva pink Ears- hearing intact Oropharynx- clear Neck- supple  Lungs- Clear to ausculation bilaterally, normal work of breathing Heart- Regular rate and rhythm, no murmurs, rubs or gallops, PMI not laterally displaced GI- soft, NT, ND, + BS Extremities- no clubbing, cyanosis, or edema MS- no significant deformity or atrophy Skin- no rash or lesion Psych- euthymic mood, full affect Neuro- strength and sensation are intact  EKG today reveals sinus rhythm, normal ekg  Assessment and Plan:  1. Atrial flutter No symptoms of atrial flutter (typical)  in over 2 years.  His CHAD2VASC score is 0.  He therefore does not require anticoagulation at this time. He would prefer to continue watchful waiting and lifestyle change at this time.  He does not wish to consider ablation. I have given cardizem 30mg  tablets which he can use prn.  2. Overweight Body mass index is 32.36 kg/m. Lifestyle modification discussed today  3. EF 45%-50% by prior TEE No chf symptoms Likely reduced by acute atrial flutter at the time Will repeat echo (ordered today)  pcp to follow lipids He snores and has mild fatigue. May consider a sleep study in the  future.  Return in 1 year unless problems arise in the interim.  Walter Range MD, [facility] 07/31/2016 10:28 AM

## 2016-08-01 DIAGNOSIS — J3089 Other allergic rhinitis: Secondary | ICD-10-CM | POA: Diagnosis not present

## 2016-08-01 DIAGNOSIS — J301 Allergic rhinitis due to pollen: Secondary | ICD-10-CM | POA: Diagnosis not present

## 2016-08-01 DIAGNOSIS — J3081 Allergic rhinitis due to animal (cat) (dog) hair and dander: Secondary | ICD-10-CM | POA: Diagnosis not present

## 2016-08-05 DIAGNOSIS — J3081 Allergic rhinitis due to animal (cat) (dog) hair and dander: Secondary | ICD-10-CM | POA: Diagnosis not present

## 2016-08-05 DIAGNOSIS — J301 Allergic rhinitis due to pollen: Secondary | ICD-10-CM | POA: Diagnosis not present

## 2016-08-05 DIAGNOSIS — J3089 Other allergic rhinitis: Secondary | ICD-10-CM | POA: Diagnosis not present

## 2016-09-03 DIAGNOSIS — J301 Allergic rhinitis due to pollen: Secondary | ICD-10-CM | POA: Diagnosis not present

## 2016-09-03 DIAGNOSIS — J3089 Other allergic rhinitis: Secondary | ICD-10-CM | POA: Diagnosis not present

## 2016-09-03 DIAGNOSIS — J3081 Allergic rhinitis due to animal (cat) (dog) hair and dander: Secondary | ICD-10-CM | POA: Diagnosis not present

## 2016-09-06 ENCOUNTER — Ambulatory Visit (HOSPITAL_COMMUNITY): Payer: BLUE CROSS/BLUE SHIELD | Attending: Internal Medicine

## 2016-09-06 ENCOUNTER — Other Ambulatory Visit: Payer: Self-pay

## 2016-09-06 DIAGNOSIS — I483 Typical atrial flutter: Secondary | ICD-10-CM | POA: Insufficient documentation

## 2016-09-06 DIAGNOSIS — I429 Cardiomyopathy, unspecified: Secondary | ICD-10-CM | POA: Insufficient documentation

## 2016-09-06 DIAGNOSIS — I340 Nonrheumatic mitral (valve) insufficiency: Secondary | ICD-10-CM | POA: Diagnosis not present

## 2016-09-06 DIAGNOSIS — I371 Nonrheumatic pulmonary valve insufficiency: Secondary | ICD-10-CM | POA: Insufficient documentation

## 2016-09-06 DIAGNOSIS — I071 Rheumatic tricuspid insufficiency: Secondary | ICD-10-CM | POA: Insufficient documentation

## 2016-09-12 DIAGNOSIS — Z0000 Encounter for general adult medical examination without abnormal findings: Secondary | ICD-10-CM | POA: Diagnosis not present

## 2016-09-12 DIAGNOSIS — E7800 Pure hypercholesterolemia, unspecified: Secondary | ICD-10-CM | POA: Diagnosis not present

## 2016-09-12 DIAGNOSIS — Z125 Encounter for screening for malignant neoplasm of prostate: Secondary | ICD-10-CM | POA: Diagnosis not present

## 2016-09-13 DIAGNOSIS — J3081 Allergic rhinitis due to animal (cat) (dog) hair and dander: Secondary | ICD-10-CM | POA: Diagnosis not present

## 2016-09-13 DIAGNOSIS — J3089 Other allergic rhinitis: Secondary | ICD-10-CM | POA: Diagnosis not present

## 2016-09-13 DIAGNOSIS — J301 Allergic rhinitis due to pollen: Secondary | ICD-10-CM | POA: Diagnosis not present

## 2016-09-17 ENCOUNTER — Telehealth: Payer: Self-pay

## 2016-09-17 NOTE — Telephone Encounter (Signed)
Left message on work voicemail and cell voicemail of stabilized and normalized echo results. Encouraged patient to contact office if any questions or concerns.

## 2016-09-26 ENCOUNTER — Encounter (INDEPENDENT_AMBULATORY_CARE_PROVIDER_SITE_OTHER): Payer: Self-pay | Admitting: Orthopedic Surgery

## 2016-09-26 ENCOUNTER — Telehealth (INDEPENDENT_AMBULATORY_CARE_PROVIDER_SITE_OTHER): Payer: Self-pay

## 2016-09-26 ENCOUNTER — Ambulatory Visit (INDEPENDENT_AMBULATORY_CARE_PROVIDER_SITE_OTHER): Payer: BLUE CROSS/BLUE SHIELD | Admitting: Orthopedic Surgery

## 2016-09-26 ENCOUNTER — Ambulatory Visit (INDEPENDENT_AMBULATORY_CARE_PROVIDER_SITE_OTHER): Payer: BLUE CROSS/BLUE SHIELD

## 2016-09-26 DIAGNOSIS — M1712 Unilateral primary osteoarthritis, left knee: Secondary | ICD-10-CM

## 2016-09-26 DIAGNOSIS — Z8739 Personal history of other diseases of the musculoskeletal system and connective tissue: Secondary | ICD-10-CM | POA: Diagnosis not present

## 2016-09-26 MED ORDER — METHYLPREDNISOLONE ACETATE 40 MG/ML IJ SUSP
40.0000 mg | INTRAMUSCULAR | Status: AC | PRN
  Administered 2016-09-26: 40 mg via INTRA_ARTICULAR

## 2016-09-26 MED ORDER — LIDOCAINE HCL 1 % IJ SOLN
5.0000 mL | INTRAMUSCULAR | Status: AC | PRN
  Administered 2016-09-26: 5 mL

## 2016-09-26 NOTE — Progress Notes (Signed)
Office Visit Note   Patient: Walter [name]           Date of Birth: [birth year]           MRN: [id number] Visit Date: 09/26/2016              Requested by: L.Lupe Carney, MD [address] [place], [place] [address] PCP: Lupe Carney, MD  Chief Complaint  Patient presents with  . Left Knee - Pain      HPI: Patient has been having episodes of increased swelling and pain with increased activity such as walking 5 miles. Patient states his knee swells and cannot bend it. Patient denies any mechanical symptoms denies any catching locking or giving way.  Assessment & Plan: Visit Diagnoses:  1. Unilateral primary osteoarthritis, left knee   2. H/O calcium pyrophosphate deposition disease (CPPD)     Plan: Left knee was injected without complications we'll have him follow up in 4 weeks for evaluation for a moderate disc injection. Discussed that we will review diet and possible gout medications for the CPPD.  Follow-Up Instructions: Return in about 4 weeks (around 10/24/2016).   Ortho Exam  Patient is alert, oriented, no adenopathy, well-dressed, normal affect, normal respiratory effort. Patient has an antalgic gait. Examination he has a mild effusion of the left knee collaterals and cruciates are stable there is no crepitation with range of motion the patellofemoral joint is nontender to palpation is exquisitely tender to palpation medial joint line as well as lateral joint line. Clausen cruciate are stable. There is no redness no cellulitis.  Imaging: Xr Knee 1-2 Views Left  Result Date: 09/26/2016 2 view radiographs of the left knee shows medial joint space narrowing patient does have calcification of the medial and lateral meniscus of both knees consistent with CPPD. There is no periarticular bony spurs.   Labs: Lab Results  Component Value Date   HGBA1C 5.7 (H) 09/25/2013    Orders:  Orders Placed This Encounter  Procedures  . XR Knee 1-2 Views Left    No orders of the defined types were placed in this encounter.    Procedures: Large Joint Inj Date/Time: 09/26/2016 9:27 AM Performed by: Devera Englander V Authorized by: Nadara Mustard   Consent Given by:  Patient Site marked: the procedure site was marked   Timeout: prior to procedure the correct patient, procedure, and site was verified   Indications:  Pain and diagnostic evaluation Location:  Knee Site:  L knee Prep: patient was prepped and draped in usual sterile fashion   Needle Size:  22 G Needle Length:  1.5 inches Approach:  Anteromedial Ultrasound Guidance: No   Fluoroscopic Guidance: No   Arthrogram: No   Medications:  5 mL lidocaine 1 %; 40 mg methylPREDNISolone acetate 40 MG/ML Aspiration Attempted: No   Patient tolerance:  Patient tolerated the procedure well with no immediate complications    Clinical Data: No additional findings.  ROS:  All other systems negative, except as noted in the HPI. Review of Systems  Objective: Vital Signs: There were no vitals taken for this visit.  Specialty Comments:  No specialty comments available.  PMFS History: Patient Active Problem List   Diagnosis Date Noted  . Unilateral primary osteoarthritis, left knee 09/26/2016  . Atrial flutter (HCC) 09/25/2013  . Near syncope 09/25/2013   Past Medical History:  Diagnosis Date  . Atrial flutter (HCC)   . Dizzy 09/25/13  . Near syncope 09/25/13  Family History  Problem Relation Age of Onset  . Heart disease Mother   . Heart disease Father   . Healthy Sister   . Healthy Brother     Past Surgical History:  Procedure Laterality Date  . CARDIOVERSION N/A 09/27/2013   Procedure: CARDIOVERSION;  Surgeon: Lewayne Bunting, MD;  Location: [facility] ENDOSCOPY;  Service: Cardiovascular;  Laterality: N/A;  . RETINAL DETACHMENT SURGERY    . right knee arthroscopy    . TEE WITHOUT CARDIOVERSION N/A 09/27/2013   Procedure: TRANSESOPHAGEAL ECHOCARDIOGRAM (TEE);  Surgeon: Lewayne Bunting, MD;  Location: [facility] ENDOSCOPY;  Service: Cardiovascular;  Laterality: N/A;  . WRIST SURGERY     Social History   Occupational History  . Not on file.   Social History Main Topics  . Smoking status: Never Smoker  . Smokeless tobacco: Never Used  . Alcohol use Yes     Comment: occasionally  . Drug use: No  . Sexual activity: Not on file

## 2016-09-26 NOTE — Telephone Encounter (Signed)
Will submit auth for synvisc left knee injection. Will hold till approval obtained.

## 2016-10-01 ENCOUNTER — Telehealth (INDEPENDENT_AMBULATORY_CARE_PROVIDER_SITE_OTHER): Payer: Self-pay | Admitting: Orthopedic Surgery

## 2016-10-01 DIAGNOSIS — J301 Allergic rhinitis due to pollen: Secondary | ICD-10-CM | POA: Diagnosis not present

## 2016-10-01 DIAGNOSIS — J3081 Allergic rhinitis due to animal (cat) (dog) hair and dander: Secondary | ICD-10-CM | POA: Diagnosis not present

## 2016-10-01 DIAGNOSIS — J3089 Other allergic rhinitis: Secondary | ICD-10-CM | POA: Diagnosis not present

## 2016-10-01 NOTE — Telephone Encounter (Signed)
LM FOR PT TO CALL BACK AND [facility]  THANKS

## 2016-10-01 NOTE — Telephone Encounter (Signed)
Can you please cal pt and make an appt for next week for synvisc injection with Dr. Lajoyce Corners or erin please? Thanks!

## 2016-10-03 DIAGNOSIS — J3081 Allergic rhinitis due to animal (cat) (dog) hair and dander: Secondary | ICD-10-CM | POA: Diagnosis not present

## 2016-10-03 DIAGNOSIS — J3089 Other allergic rhinitis: Secondary | ICD-10-CM | POA: Diagnosis not present

## 2016-10-03 DIAGNOSIS — J301 Allergic rhinitis due to pollen: Secondary | ICD-10-CM | POA: Diagnosis not present

## 2016-10-03 DIAGNOSIS — H1045 Other chronic allergic conjunctivitis: Secondary | ICD-10-CM | POA: Diagnosis not present

## 2016-10-10 DIAGNOSIS — J301 Allergic rhinitis due to pollen: Secondary | ICD-10-CM | POA: Diagnosis not present

## 2016-10-10 DIAGNOSIS — J3081 Allergic rhinitis due to animal (cat) (dog) hair and dander: Secondary | ICD-10-CM | POA: Diagnosis not present

## 2016-10-10 DIAGNOSIS — J3089 Other allergic rhinitis: Secondary | ICD-10-CM | POA: Diagnosis not present

## 2016-10-22 ENCOUNTER — Ambulatory Visit (INDEPENDENT_AMBULATORY_CARE_PROVIDER_SITE_OTHER): Payer: BLUE CROSS/BLUE SHIELD | Admitting: Orthopedic Surgery

## 2016-11-01 DIAGNOSIS — J301 Allergic rhinitis due to pollen: Secondary | ICD-10-CM | POA: Diagnosis not present

## 2016-11-01 DIAGNOSIS — J3089 Other allergic rhinitis: Secondary | ICD-10-CM | POA: Diagnosis not present

## 2016-11-01 DIAGNOSIS — J3081 Allergic rhinitis due to animal (cat) (dog) hair and dander: Secondary | ICD-10-CM | POA: Diagnosis not present

## 2016-11-21 DIAGNOSIS — J3089 Other allergic rhinitis: Secondary | ICD-10-CM | POA: Diagnosis not present

## 2016-11-21 DIAGNOSIS — J3081 Allergic rhinitis due to animal (cat) (dog) hair and dander: Secondary | ICD-10-CM | POA: Diagnosis not present

## 2016-11-21 DIAGNOSIS — J301 Allergic rhinitis due to pollen: Secondary | ICD-10-CM | POA: Diagnosis not present

## 2016-11-28 DIAGNOSIS — J3081 Allergic rhinitis due to animal (cat) (dog) hair and dander: Secondary | ICD-10-CM | POA: Diagnosis not present

## 2016-11-28 DIAGNOSIS — J3089 Other allergic rhinitis: Secondary | ICD-10-CM | POA: Diagnosis not present

## 2016-11-28 DIAGNOSIS — J301 Allergic rhinitis due to pollen: Secondary | ICD-10-CM | POA: Diagnosis not present

## 2016-12-06 DIAGNOSIS — J3081 Allergic rhinitis due to animal (cat) (dog) hair and dander: Secondary | ICD-10-CM | POA: Diagnosis not present

## 2016-12-06 DIAGNOSIS — J301 Allergic rhinitis due to pollen: Secondary | ICD-10-CM | POA: Diagnosis not present

## 2016-12-06 DIAGNOSIS — J3089 Other allergic rhinitis: Secondary | ICD-10-CM | POA: Diagnosis not present

## 2016-12-19 ENCOUNTER — Encounter: Payer: Self-pay | Admitting: Podiatry

## 2016-12-19 ENCOUNTER — Ambulatory Visit (INDEPENDENT_AMBULATORY_CARE_PROVIDER_SITE_OTHER): Payer: BLUE CROSS/BLUE SHIELD

## 2016-12-19 ENCOUNTER — Ambulatory Visit (INDEPENDENT_AMBULATORY_CARE_PROVIDER_SITE_OTHER): Payer: BLUE CROSS/BLUE SHIELD | Admitting: Podiatry

## 2016-12-19 DIAGNOSIS — M79671 Pain in right foot: Secondary | ICD-10-CM

## 2016-12-19 DIAGNOSIS — B351 Tinea unguium: Secondary | ICD-10-CM | POA: Diagnosis not present

## 2016-12-19 DIAGNOSIS — M779 Enthesopathy, unspecified: Secondary | ICD-10-CM | POA: Diagnosis not present

## 2016-12-19 DIAGNOSIS — M79672 Pain in left foot: Secondary | ICD-10-CM

## 2016-12-19 MED ORDER — TERBINAFINE HCL 250 MG PO TABS: ORAL_TABLET | ORAL | 0 refills | Status: DC

## 2016-12-19 NOTE — Progress Notes (Signed)
   Subjective:    Patient ID: Walter [name], male    DOB: 16-Sep-1955, 61 y.o.   MRN: [id number]  HPI    Review of Systems  All other systems reviewed and are negative.      Objective:   Physical Exam        Assessment & Plan:

## 2016-12-19 NOTE — Progress Notes (Signed)
Subjective:    Patient ID: Walter [name], male   DOB: 61 y.o.   MRN: [id number]   HPI patient presents stating that he has discomfort in his feet and also he has nail disease of 3 nails on his left foot    Review of Systems  All other systems reviewed and are negative.       Objective:  Physical Exam  Cardiovascular: Normal rate and intact distal pulses.   Musculoskeletal: Normal range of motion.  Neurological: He is alert.  Skin: Skin is warm.  Nursing note and vitals reviewed.  neurovascular status intact muscle strength adequate with patient noted to have moderate discomfort both feet and discoloration of several nails left foot that he uses topical medicine for     Assessment:   Tendinitis with mycotic nail infection      Plan:     H&P conditions reviewed and went ahead and scanned him for new orthotics. He will also have his second pair brought with him at next visit and they will be recovered by head orthotist and will be seen by him for orthotic dispensing

## 2016-12-26 DIAGNOSIS — J301 Allergic rhinitis due to pollen: Secondary | ICD-10-CM | POA: Diagnosis not present

## 2016-12-26 DIAGNOSIS — J3081 Allergic rhinitis due to animal (cat) (dog) hair and dander: Secondary | ICD-10-CM | POA: Diagnosis not present

## 2016-12-26 DIAGNOSIS — J3089 Other allergic rhinitis: Secondary | ICD-10-CM | POA: Diagnosis not present

## 2017-01-02 DIAGNOSIS — J301 Allergic rhinitis due to pollen: Secondary | ICD-10-CM | POA: Diagnosis not present

## 2017-01-02 DIAGNOSIS — J3089 Other allergic rhinitis: Secondary | ICD-10-CM | POA: Diagnosis not present

## 2017-01-02 DIAGNOSIS — J3081 Allergic rhinitis due to animal (cat) (dog) hair and dander: Secondary | ICD-10-CM | POA: Diagnosis not present

## 2017-01-16 ENCOUNTER — Ambulatory Visit: Payer: BLUE CROSS/BLUE SHIELD | Admitting: Orthotics

## 2017-01-28 DIAGNOSIS — J3081 Allergic rhinitis due to animal (cat) (dog) hair and dander: Secondary | ICD-10-CM | POA: Diagnosis not present

## 2017-01-28 DIAGNOSIS — J301 Allergic rhinitis due to pollen: Secondary | ICD-10-CM | POA: Diagnosis not present

## 2017-01-28 DIAGNOSIS — J3089 Other allergic rhinitis: Secondary | ICD-10-CM | POA: Diagnosis not present

## 2017-01-30 DIAGNOSIS — J3081 Allergic rhinitis due to animal (cat) (dog) hair and dander: Secondary | ICD-10-CM | POA: Diagnosis not present

## 2017-01-30 DIAGNOSIS — J301 Allergic rhinitis due to pollen: Secondary | ICD-10-CM | POA: Diagnosis not present

## 2017-01-31 DIAGNOSIS — J3089 Other allergic rhinitis: Secondary | ICD-10-CM | POA: Diagnosis not present

## 2017-02-26 DIAGNOSIS — J3081 Allergic rhinitis due to animal (cat) (dog) hair and dander: Secondary | ICD-10-CM | POA: Diagnosis not present

## 2017-02-26 DIAGNOSIS — J301 Allergic rhinitis due to pollen: Secondary | ICD-10-CM | POA: Diagnosis not present

## 2017-02-26 DIAGNOSIS — J3089 Other allergic rhinitis: Secondary | ICD-10-CM | POA: Diagnosis not present

## 2017-03-05 DIAGNOSIS — J301 Allergic rhinitis due to pollen: Secondary | ICD-10-CM | POA: Diagnosis not present

## 2017-03-05 DIAGNOSIS — J3089 Other allergic rhinitis: Secondary | ICD-10-CM | POA: Diagnosis not present

## 2017-03-05 DIAGNOSIS — H1045 Other chronic allergic conjunctivitis: Secondary | ICD-10-CM | POA: Diagnosis not present

## 2017-03-05 DIAGNOSIS — T63441A Toxic effect of venom of bees, accidental (unintentional), initial encounter: Secondary | ICD-10-CM | POA: Diagnosis not present

## 2017-03-05 DIAGNOSIS — J3081 Allergic rhinitis due to animal (cat) (dog) hair and dander: Secondary | ICD-10-CM | POA: Diagnosis not present

## 2017-03-12 DIAGNOSIS — J301 Allergic rhinitis due to pollen: Secondary | ICD-10-CM | POA: Diagnosis not present

## 2017-03-12 DIAGNOSIS — J3089 Other allergic rhinitis: Secondary | ICD-10-CM | POA: Diagnosis not present

## 2017-03-12 DIAGNOSIS — J3081 Allergic rhinitis due to animal (cat) (dog) hair and dander: Secondary | ICD-10-CM | POA: Diagnosis not present

## 2017-03-17 DIAGNOSIS — J301 Allergic rhinitis due to pollen: Secondary | ICD-10-CM | POA: Diagnosis not present

## 2017-03-17 DIAGNOSIS — J3081 Allergic rhinitis due to animal (cat) (dog) hair and dander: Secondary | ICD-10-CM | POA: Diagnosis not present

## 2017-03-17 DIAGNOSIS — J3089 Other allergic rhinitis: Secondary | ICD-10-CM | POA: Diagnosis not present

## 2017-03-18 DIAGNOSIS — H1013 Acute atopic conjunctivitis, bilateral: Secondary | ICD-10-CM | POA: Diagnosis not present

## 2017-04-14 DIAGNOSIS — J301 Allergic rhinitis due to pollen: Secondary | ICD-10-CM | POA: Diagnosis not present

## 2017-04-14 DIAGNOSIS — J3081 Allergic rhinitis due to animal (cat) (dog) hair and dander: Secondary | ICD-10-CM | POA: Diagnosis not present

## 2017-04-14 DIAGNOSIS — J3089 Other allergic rhinitis: Secondary | ICD-10-CM | POA: Diagnosis not present

## 2017-04-18 DIAGNOSIS — J301 Allergic rhinitis due to pollen: Secondary | ICD-10-CM | POA: Diagnosis not present

## 2017-04-18 DIAGNOSIS — J3089 Other allergic rhinitis: Secondary | ICD-10-CM | POA: Diagnosis not present

## 2017-04-18 DIAGNOSIS — J3081 Allergic rhinitis due to animal (cat) (dog) hair and dander: Secondary | ICD-10-CM | POA: Diagnosis not present

## 2017-04-23 DIAGNOSIS — J3081 Allergic rhinitis due to animal (cat) (dog) hair and dander: Secondary | ICD-10-CM | POA: Diagnosis not present

## 2017-04-23 DIAGNOSIS — J301 Allergic rhinitis due to pollen: Secondary | ICD-10-CM | POA: Diagnosis not present

## 2017-04-23 DIAGNOSIS — J3089 Other allergic rhinitis: Secondary | ICD-10-CM | POA: Diagnosis not present

## 2017-05-07 DIAGNOSIS — J3089 Other allergic rhinitis: Secondary | ICD-10-CM | POA: Diagnosis not present

## 2017-05-07 DIAGNOSIS — J301 Allergic rhinitis due to pollen: Secondary | ICD-10-CM | POA: Diagnosis not present

## 2017-05-07 DIAGNOSIS — J3081 Allergic rhinitis due to animal (cat) (dog) hair and dander: Secondary | ICD-10-CM | POA: Diagnosis not present

## 2017-05-13 DIAGNOSIS — J3089 Other allergic rhinitis: Secondary | ICD-10-CM | POA: Diagnosis not present

## 2017-05-13 DIAGNOSIS — J3081 Allergic rhinitis due to animal (cat) (dog) hair and dander: Secondary | ICD-10-CM | POA: Diagnosis not present

## 2017-05-13 DIAGNOSIS — J301 Allergic rhinitis due to pollen: Secondary | ICD-10-CM | POA: Diagnosis not present

## 2017-05-19 DIAGNOSIS — J301 Allergic rhinitis due to pollen: Secondary | ICD-10-CM | POA: Diagnosis not present

## 2017-05-19 DIAGNOSIS — J3089 Other allergic rhinitis: Secondary | ICD-10-CM | POA: Diagnosis not present

## 2017-05-19 DIAGNOSIS — J3081 Allergic rhinitis due to animal (cat) (dog) hair and dander: Secondary | ICD-10-CM | POA: Diagnosis not present

## 2017-05-19 DIAGNOSIS — S0501XA Injury of conjunctiva and corneal abrasion without foreign body, right eye, initial encounter: Secondary | ICD-10-CM | POA: Diagnosis not present

## 2017-06-05 DIAGNOSIS — J3081 Allergic rhinitis due to animal (cat) (dog) hair and dander: Secondary | ICD-10-CM | POA: Diagnosis not present

## 2017-06-05 DIAGNOSIS — J301 Allergic rhinitis due to pollen: Secondary | ICD-10-CM | POA: Diagnosis not present

## 2017-06-05 DIAGNOSIS — J3089 Other allergic rhinitis: Secondary | ICD-10-CM | POA: Diagnosis not present

## 2017-06-11 DIAGNOSIS — J301 Allergic rhinitis due to pollen: Secondary | ICD-10-CM | POA: Diagnosis not present

## 2017-06-11 DIAGNOSIS — J3081 Allergic rhinitis due to animal (cat) (dog) hair and dander: Secondary | ICD-10-CM | POA: Diagnosis not present

## 2017-06-11 DIAGNOSIS — J3089 Other allergic rhinitis: Secondary | ICD-10-CM | POA: Diagnosis not present

## 2017-06-18 DIAGNOSIS — J301 Allergic rhinitis due to pollen: Secondary | ICD-10-CM | POA: Diagnosis not present

## 2017-06-18 DIAGNOSIS — J3089 Other allergic rhinitis: Secondary | ICD-10-CM | POA: Diagnosis not present

## 2017-06-18 DIAGNOSIS — J3081 Allergic rhinitis due to animal (cat) (dog) hair and dander: Secondary | ICD-10-CM | POA: Diagnosis not present

## 2017-07-09 DIAGNOSIS — J301 Allergic rhinitis due to pollen: Secondary | ICD-10-CM | POA: Diagnosis not present

## 2017-07-09 DIAGNOSIS — J3081 Allergic rhinitis due to animal (cat) (dog) hair and dander: Secondary | ICD-10-CM | POA: Diagnosis not present

## 2017-07-09 DIAGNOSIS — J3089 Other allergic rhinitis: Secondary | ICD-10-CM | POA: Diagnosis not present

## 2017-07-16 DIAGNOSIS — J3089 Other allergic rhinitis: Secondary | ICD-10-CM | POA: Diagnosis not present

## 2017-07-16 DIAGNOSIS — J301 Allergic rhinitis due to pollen: Secondary | ICD-10-CM | POA: Diagnosis not present

## 2017-07-16 DIAGNOSIS — J3081 Allergic rhinitis due to animal (cat) (dog) hair and dander: Secondary | ICD-10-CM | POA: Diagnosis not present

## 2017-07-25 DIAGNOSIS — J3089 Other allergic rhinitis: Secondary | ICD-10-CM | POA: Diagnosis not present

## 2017-07-25 DIAGNOSIS — J3081 Allergic rhinitis due to animal (cat) (dog) hair and dander: Secondary | ICD-10-CM | POA: Diagnosis not present

## 2017-07-25 DIAGNOSIS — J301 Allergic rhinitis due to pollen: Secondary | ICD-10-CM | POA: Diagnosis not present

## 2017-08-04 ENCOUNTER — Ambulatory Visit: Payer: BLUE CROSS/BLUE SHIELD | Admitting: Internal Medicine

## 2017-08-07 DIAGNOSIS — J3081 Allergic rhinitis due to animal (cat) (dog) hair and dander: Secondary | ICD-10-CM | POA: Diagnosis not present

## 2017-08-07 DIAGNOSIS — J301 Allergic rhinitis due to pollen: Secondary | ICD-10-CM | POA: Diagnosis not present

## 2017-08-07 DIAGNOSIS — J3089 Other allergic rhinitis: Secondary | ICD-10-CM | POA: Diagnosis not present

## 2017-08-13 DIAGNOSIS — J3089 Other allergic rhinitis: Secondary | ICD-10-CM | POA: Diagnosis not present

## 2017-08-13 DIAGNOSIS — J301 Allergic rhinitis due to pollen: Secondary | ICD-10-CM | POA: Diagnosis not present

## 2017-08-13 DIAGNOSIS — J3081 Allergic rhinitis due to animal (cat) (dog) hair and dander: Secondary | ICD-10-CM | POA: Diagnosis not present

## 2017-08-21 DIAGNOSIS — J3089 Other allergic rhinitis: Secondary | ICD-10-CM | POA: Diagnosis not present

## 2017-08-21 DIAGNOSIS — J3081 Allergic rhinitis due to animal (cat) (dog) hair and dander: Secondary | ICD-10-CM | POA: Diagnosis not present

## 2017-08-21 DIAGNOSIS — J301 Allergic rhinitis due to pollen: Secondary | ICD-10-CM | POA: Diagnosis not present

## 2017-08-28 DIAGNOSIS — J3089 Other allergic rhinitis: Secondary | ICD-10-CM | POA: Diagnosis not present

## 2017-08-28 DIAGNOSIS — J301 Allergic rhinitis due to pollen: Secondary | ICD-10-CM | POA: Diagnosis not present

## 2017-08-28 DIAGNOSIS — J3081 Allergic rhinitis due to animal (cat) (dog) hair and dander: Secondary | ICD-10-CM | POA: Diagnosis not present

## 2017-09-01 ENCOUNTER — Ambulatory Visit (INDEPENDENT_AMBULATORY_CARE_PROVIDER_SITE_OTHER): Payer: BLUE CROSS/BLUE SHIELD | Admitting: Internal Medicine

## 2017-09-01 ENCOUNTER — Encounter: Payer: Self-pay | Admitting: Internal Medicine

## 2017-09-01 VITALS — BP 126/64 | HR 66 | Ht 71.0 in | Wt 223.0 lb

## 2017-09-01 DIAGNOSIS — I483 Typical atrial flutter: Secondary | ICD-10-CM

## 2017-09-01 DIAGNOSIS — I429 Cardiomyopathy, unspecified: Secondary | ICD-10-CM

## 2017-09-01 NOTE — Progress Notes (Signed)
   PCP: Clovis Riley, L.August Saucer, MD   Primary EP: Dr Johney Frame  Walter [name] is a 62 y.o. male who presents today for routine electrophysiology followup.  Since last being seen in our clinic, the patient reports doing very well.  Today, he denies symptoms of palpitations, chest pain, shortness of breath,  lower extremity edema, dizziness, presyncope, or syncope.  The patient is otherwise without complaint today.   Past Medical History:  Diagnosis Date  . Atrial flutter (HCC)   . Dizzy 09/25/13  . Near syncope 09/25/13   Past Surgical History:  Procedure Laterality Date  . CARDIOVERSION N/A 09/27/2013   Procedure: CARDIOVERSION;  Surgeon: Lewayne Bunting, MD;  Location: [facility] ENDOSCOPY;  Service: Cardiovascular;  Laterality: N/A;  . RETINAL DETACHMENT SURGERY    . right knee arthroscopy    . TEE WITHOUT CARDIOVERSION N/A 09/27/2013   Procedure: TRANSESOPHAGEAL ECHOCARDIOGRAM (TEE);  Surgeon: Lewayne Bunting, MD;  Location: [facility] ENDOSCOPY;  Service: Cardiovascular;  Laterality: N/A;  . WRIST SURGERY      ROS- all systems are reviewed and negatives except as per HPI above  Current Outpatient Medications  Medication Sig Dispense Refill  . aspirin EC 81 MG EC tablet Take 1 tablet (81 mg total) by mouth daily.    [name] diltiazem (CARDIZEM) 30 MG tablet Take 1-2 tablets every 6 hours as needed for fast heart rates (Patient not taking: Reported on 09/01/2017) 30 tablet 1  . terbinafine (LAMISIL) 250 MG tablet Please take one a day x 7days, repeat every 4 weeks x 4 months (Patient not taking: Reported on 09/01/2017) 28 tablet 0   No current facility-administered medications for this visit.     Physical Exam: Vitals:   09/01/17 1116  BP: 126/64  Pulse: 66  Weight: 223 lb (101.2 kg)  Height: 5\' 11"  (1.803 m)    GEN- The patient is well appearing, alert and oriented x 3 today.   Head- normocephalic, atraumatic Eyes-  Sclera clear, conjunctiva pink Ears- hearing intact Oropharynx- clear Lungs- Clear to  ausculation bilaterally, normal work of breathing Heart- Regular rate and rhythm, no murmurs, rubs or gallops, PMI not laterally displaced GI- soft, NT, ND, + BS Extremities- no clubbing, cyanosis, or edema  EKG tracing ordered today is personally reviewed and shows sinus rhythm with PACs, 66 bpm  Echo 09/06/16 reviewed with patient today,  EF is preserved  Assessment and Plan:  1. Atrial flutter No symptoms of typical flutter in over 2 years chads2vasc score is 0. No changes  2. Overweight Body mass index is 31.1 kg/m. Lifestyle modification encouraged  Return to see me in a year  Hillis Range MD, [facility] 09/01/2017 11:28 AM

## 2017-09-01 NOTE — Patient Instructions (Signed)
Medication Instructions:  Your physician recommends that you continue on your current medications as directed. Please refer to the Current Medication list given to you today.  Labwork: None ordered.  Testing/Procedures: None ordered.  Follow-Up: Your physician wants you to follow-up in: one year with Dr. Allred.    You will receive a reminder letter in the mail two months in advance. If you don't receive a letter, please call our office to schedule the follow-up appointment.  Any Other Special Instructions Will Be Listed Below (If Applicable).  If you need a refill on your cardiac medications before your next appointment, please call your pharmacy.   

## 2017-09-05 DIAGNOSIS — J301 Allergic rhinitis due to pollen: Secondary | ICD-10-CM | POA: Diagnosis not present

## 2017-09-05 DIAGNOSIS — J3089 Other allergic rhinitis: Secondary | ICD-10-CM | POA: Diagnosis not present

## 2017-09-05 DIAGNOSIS — J3081 Allergic rhinitis due to animal (cat) (dog) hair and dander: Secondary | ICD-10-CM | POA: Diagnosis not present

## 2017-09-12 DIAGNOSIS — J3081 Allergic rhinitis due to animal (cat) (dog) hair and dander: Secondary | ICD-10-CM | POA: Diagnosis not present

## 2017-09-12 DIAGNOSIS — J3089 Other allergic rhinitis: Secondary | ICD-10-CM | POA: Diagnosis not present

## 2017-09-12 DIAGNOSIS — J301 Allergic rhinitis due to pollen: Secondary | ICD-10-CM | POA: Diagnosis not present

## 2017-09-16 DIAGNOSIS — J3081 Allergic rhinitis due to animal (cat) (dog) hair and dander: Secondary | ICD-10-CM | POA: Diagnosis not present

## 2017-09-16 DIAGNOSIS — J301 Allergic rhinitis due to pollen: Secondary | ICD-10-CM | POA: Diagnosis not present

## 2017-09-17 DIAGNOSIS — J3089 Other allergic rhinitis: Secondary | ICD-10-CM | POA: Diagnosis not present

## 2017-09-19 DIAGNOSIS — J3081 Allergic rhinitis due to animal (cat) (dog) hair and dander: Secondary | ICD-10-CM | POA: Diagnosis not present

## 2017-09-19 DIAGNOSIS — J3089 Other allergic rhinitis: Secondary | ICD-10-CM | POA: Diagnosis not present

## 2017-09-19 DIAGNOSIS — J301 Allergic rhinitis due to pollen: Secondary | ICD-10-CM | POA: Diagnosis not present

## 2017-09-25 DIAGNOSIS — D126 Benign neoplasm of colon, unspecified: Secondary | ICD-10-CM | POA: Diagnosis not present

## 2017-09-25 DIAGNOSIS — K5730 Diverticulosis of large intestine without perforation or abscess without bleeding: Secondary | ICD-10-CM | POA: Diagnosis not present

## 2017-09-25 DIAGNOSIS — Z86010 Personal history of colonic polyps: Secondary | ICD-10-CM | POA: Diagnosis not present

## 2017-09-26 DIAGNOSIS — Z125 Encounter for screening for malignant neoplasm of prostate: Secondary | ICD-10-CM | POA: Diagnosis not present

## 2017-09-26 DIAGNOSIS — Z0000 Encounter for general adult medical examination without abnormal findings: Secondary | ICD-10-CM | POA: Diagnosis not present

## 2017-09-26 DIAGNOSIS — Z1159 Encounter for screening for other viral diseases: Secondary | ICD-10-CM | POA: Diagnosis not present

## 2017-09-26 DIAGNOSIS — E7800 Pure hypercholesterolemia, unspecified: Secondary | ICD-10-CM | POA: Diagnosis not present

## 2017-09-30 DIAGNOSIS — J3089 Other allergic rhinitis: Secondary | ICD-10-CM | POA: Diagnosis not present

## 2017-09-30 DIAGNOSIS — D126 Benign neoplasm of colon, unspecified: Secondary | ICD-10-CM | POA: Diagnosis not present

## 2017-09-30 DIAGNOSIS — J3081 Allergic rhinitis due to animal (cat) (dog) hair and dander: Secondary | ICD-10-CM | POA: Diagnosis not present

## 2017-09-30 DIAGNOSIS — J301 Allergic rhinitis due to pollen: Secondary | ICD-10-CM | POA: Diagnosis not present

## 2017-10-03 DIAGNOSIS — H1045 Other chronic allergic conjunctivitis: Secondary | ICD-10-CM | POA: Diagnosis not present

## 2017-10-03 DIAGNOSIS — J301 Allergic rhinitis due to pollen: Secondary | ICD-10-CM | POA: Diagnosis not present

## 2017-10-03 DIAGNOSIS — J3081 Allergic rhinitis due to animal (cat) (dog) hair and dander: Secondary | ICD-10-CM | POA: Diagnosis not present

## 2017-10-03 DIAGNOSIS — J3089 Other allergic rhinitis: Secondary | ICD-10-CM | POA: Diagnosis not present

## 2017-10-08 DIAGNOSIS — J3081 Allergic rhinitis due to animal (cat) (dog) hair and dander: Secondary | ICD-10-CM | POA: Diagnosis not present

## 2017-10-08 DIAGNOSIS — J3089 Other allergic rhinitis: Secondary | ICD-10-CM | POA: Diagnosis not present

## 2017-10-08 DIAGNOSIS — J301 Allergic rhinitis due to pollen: Secondary | ICD-10-CM | POA: Diagnosis not present

## 2017-10-15 DIAGNOSIS — J3081 Allergic rhinitis due to animal (cat) (dog) hair and dander: Secondary | ICD-10-CM | POA: Diagnosis not present

## 2017-10-15 DIAGNOSIS — J3089 Other allergic rhinitis: Secondary | ICD-10-CM | POA: Diagnosis not present

## 2017-10-15 DIAGNOSIS — J301 Allergic rhinitis due to pollen: Secondary | ICD-10-CM | POA: Diagnosis not present

## 2017-10-24 DIAGNOSIS — J301 Allergic rhinitis due to pollen: Secondary | ICD-10-CM | POA: Diagnosis not present

## 2017-10-24 DIAGNOSIS — J3089 Other allergic rhinitis: Secondary | ICD-10-CM | POA: Diagnosis not present

## 2017-10-24 DIAGNOSIS — J3081 Allergic rhinitis due to animal (cat) (dog) hair and dander: Secondary | ICD-10-CM | POA: Diagnosis not present

## 2017-10-29 DIAGNOSIS — J3089 Other allergic rhinitis: Secondary | ICD-10-CM | POA: Diagnosis not present

## 2017-10-29 DIAGNOSIS — J301 Allergic rhinitis due to pollen: Secondary | ICD-10-CM | POA: Diagnosis not present

## 2017-10-29 DIAGNOSIS — J3081 Allergic rhinitis due to animal (cat) (dog) hair and dander: Secondary | ICD-10-CM | POA: Diagnosis not present

## 2017-11-06 DIAGNOSIS — J3089 Other allergic rhinitis: Secondary | ICD-10-CM | POA: Diagnosis not present

## 2017-11-06 DIAGNOSIS — J3081 Allergic rhinitis due to animal (cat) (dog) hair and dander: Secondary | ICD-10-CM | POA: Diagnosis not present

## 2017-11-06 DIAGNOSIS — J301 Allergic rhinitis due to pollen: Secondary | ICD-10-CM | POA: Diagnosis not present

## 2017-11-12 DIAGNOSIS — J3081 Allergic rhinitis due to animal (cat) (dog) hair and dander: Secondary | ICD-10-CM | POA: Diagnosis not present

## 2017-11-12 DIAGNOSIS — J3089 Other allergic rhinitis: Secondary | ICD-10-CM | POA: Diagnosis not present

## 2017-11-12 DIAGNOSIS — J301 Allergic rhinitis due to pollen: Secondary | ICD-10-CM | POA: Diagnosis not present

## 2017-11-16 DIAGNOSIS — J019 Acute sinusitis, unspecified: Secondary | ICD-10-CM | POA: Diagnosis not present

## 2017-11-16 DIAGNOSIS — R05 Cough: Secondary | ICD-10-CM | POA: Diagnosis not present

## 2018-01-14 DIAGNOSIS — J3089 Other allergic rhinitis: Secondary | ICD-10-CM | POA: Diagnosis not present

## 2018-01-14 DIAGNOSIS — J301 Allergic rhinitis due to pollen: Secondary | ICD-10-CM | POA: Diagnosis not present

## 2018-01-14 DIAGNOSIS — J3081 Allergic rhinitis due to animal (cat) (dog) hair and dander: Secondary | ICD-10-CM | POA: Diagnosis not present

## 2018-01-21 DIAGNOSIS — J3081 Allergic rhinitis due to animal (cat) (dog) hair and dander: Secondary | ICD-10-CM | POA: Diagnosis not present

## 2018-01-21 DIAGNOSIS — J3089 Other allergic rhinitis: Secondary | ICD-10-CM | POA: Diagnosis not present

## 2018-01-21 DIAGNOSIS — J301 Allergic rhinitis due to pollen: Secondary | ICD-10-CM | POA: Diagnosis not present

## 2018-01-29 DIAGNOSIS — J301 Allergic rhinitis due to pollen: Secondary | ICD-10-CM | POA: Diagnosis not present

## 2018-01-29 DIAGNOSIS — J3081 Allergic rhinitis due to animal (cat) (dog) hair and dander: Secondary | ICD-10-CM | POA: Diagnosis not present

## 2018-01-29 DIAGNOSIS — J3089 Other allergic rhinitis: Secondary | ICD-10-CM | POA: Diagnosis not present

## 2018-02-03 DIAGNOSIS — J301 Allergic rhinitis due to pollen: Secondary | ICD-10-CM | POA: Diagnosis not present

## 2018-02-03 DIAGNOSIS — J3089 Other allergic rhinitis: Secondary | ICD-10-CM | POA: Diagnosis not present

## 2018-02-03 DIAGNOSIS — J3081 Allergic rhinitis due to animal (cat) (dog) hair and dander: Secondary | ICD-10-CM | POA: Diagnosis not present

## 2018-02-27 DIAGNOSIS — J301 Allergic rhinitis due to pollen: Secondary | ICD-10-CM | POA: Diagnosis not present

## 2018-02-27 DIAGNOSIS — J3089 Other allergic rhinitis: Secondary | ICD-10-CM | POA: Diagnosis not present

## 2018-02-27 DIAGNOSIS — J3081 Allergic rhinitis due to animal (cat) (dog) hair and dander: Secondary | ICD-10-CM | POA: Diagnosis not present

## 2018-03-04 DIAGNOSIS — D1801 Hemangioma of skin and subcutaneous tissue: Secondary | ICD-10-CM | POA: Diagnosis not present

## 2018-03-04 DIAGNOSIS — J301 Allergic rhinitis due to pollen: Secondary | ICD-10-CM | POA: Diagnosis not present

## 2018-03-04 DIAGNOSIS — B353 Tinea pedis: Secondary | ICD-10-CM | POA: Diagnosis not present

## 2018-03-04 DIAGNOSIS — L309 Dermatitis, unspecified: Secondary | ICD-10-CM | POA: Diagnosis not present

## 2018-03-04 DIAGNOSIS — L111 Transient acantholytic dermatosis [Grover]: Secondary | ICD-10-CM | POA: Diagnosis not present

## 2018-03-04 DIAGNOSIS — J3089 Other allergic rhinitis: Secondary | ICD-10-CM | POA: Diagnosis not present

## 2018-03-04 DIAGNOSIS — J3081 Allergic rhinitis due to animal (cat) (dog) hair and dander: Secondary | ICD-10-CM | POA: Diagnosis not present

## 2018-03-09 DIAGNOSIS — J3081 Allergic rhinitis due to animal (cat) (dog) hair and dander: Secondary | ICD-10-CM | POA: Diagnosis not present

## 2018-03-09 DIAGNOSIS — J3089 Other allergic rhinitis: Secondary | ICD-10-CM | POA: Diagnosis not present

## 2018-03-09 DIAGNOSIS — J301 Allergic rhinitis due to pollen: Secondary | ICD-10-CM | POA: Diagnosis not present

## 2018-03-25 DIAGNOSIS — J301 Allergic rhinitis due to pollen: Secondary | ICD-10-CM | POA: Diagnosis not present

## 2018-03-25 DIAGNOSIS — J3089 Other allergic rhinitis: Secondary | ICD-10-CM | POA: Diagnosis not present

## 2018-03-25 DIAGNOSIS — J3081 Allergic rhinitis due to animal (cat) (dog) hair and dander: Secondary | ICD-10-CM | POA: Diagnosis not present

## 2018-04-02 DIAGNOSIS — J3081 Allergic rhinitis due to animal (cat) (dog) hair and dander: Secondary | ICD-10-CM | POA: Diagnosis not present

## 2018-04-02 DIAGNOSIS — J3089 Other allergic rhinitis: Secondary | ICD-10-CM | POA: Diagnosis not present

## 2018-04-02 DIAGNOSIS — J301 Allergic rhinitis due to pollen: Secondary | ICD-10-CM | POA: Diagnosis not present

## 2018-04-09 DIAGNOSIS — J301 Allergic rhinitis due to pollen: Secondary | ICD-10-CM | POA: Diagnosis not present

## 2018-04-09 DIAGNOSIS — J3081 Allergic rhinitis due to animal (cat) (dog) hair and dander: Secondary | ICD-10-CM | POA: Diagnosis not present

## 2018-04-09 DIAGNOSIS — J3089 Other allergic rhinitis: Secondary | ICD-10-CM | POA: Diagnosis not present

## 2018-04-15 DIAGNOSIS — J301 Allergic rhinitis due to pollen: Secondary | ICD-10-CM | POA: Diagnosis not present

## 2018-04-15 DIAGNOSIS — J3081 Allergic rhinitis due to animal (cat) (dog) hair and dander: Secondary | ICD-10-CM | POA: Diagnosis not present

## 2018-04-15 DIAGNOSIS — J3089 Other allergic rhinitis: Secondary | ICD-10-CM | POA: Diagnosis not present

## 2018-04-28 DIAGNOSIS — J3081 Allergic rhinitis due to animal (cat) (dog) hair and dander: Secondary | ICD-10-CM | POA: Diagnosis not present

## 2018-04-28 DIAGNOSIS — J301 Allergic rhinitis due to pollen: Secondary | ICD-10-CM | POA: Diagnosis not present

## 2018-04-28 DIAGNOSIS — J3089 Other allergic rhinitis: Secondary | ICD-10-CM | POA: Diagnosis not present

## 2018-05-06 DIAGNOSIS — J301 Allergic rhinitis due to pollen: Secondary | ICD-10-CM | POA: Diagnosis not present

## 2018-05-06 DIAGNOSIS — J3081 Allergic rhinitis due to animal (cat) (dog) hair and dander: Secondary | ICD-10-CM | POA: Diagnosis not present

## 2018-05-06 DIAGNOSIS — J3089 Other allergic rhinitis: Secondary | ICD-10-CM | POA: Diagnosis not present

## 2018-05-13 DIAGNOSIS — J301 Allergic rhinitis due to pollen: Secondary | ICD-10-CM | POA: Diagnosis not present

## 2018-05-13 DIAGNOSIS — J3081 Allergic rhinitis due to animal (cat) (dog) hair and dander: Secondary | ICD-10-CM | POA: Diagnosis not present

## 2018-05-13 DIAGNOSIS — J3089 Other allergic rhinitis: Secondary | ICD-10-CM | POA: Diagnosis not present

## 2018-05-22 DIAGNOSIS — J301 Allergic rhinitis due to pollen: Secondary | ICD-10-CM | POA: Diagnosis not present

## 2018-05-22 DIAGNOSIS — J3081 Allergic rhinitis due to animal (cat) (dog) hair and dander: Secondary | ICD-10-CM | POA: Diagnosis not present

## 2018-05-25 DIAGNOSIS — J3089 Other allergic rhinitis: Secondary | ICD-10-CM | POA: Diagnosis not present

## 2018-06-05 DIAGNOSIS — J301 Allergic rhinitis due to pollen: Secondary | ICD-10-CM | POA: Diagnosis not present

## 2018-06-05 DIAGNOSIS — J3089 Other allergic rhinitis: Secondary | ICD-10-CM | POA: Diagnosis not present

## 2018-06-05 DIAGNOSIS — J3081 Allergic rhinitis due to animal (cat) (dog) hair and dander: Secondary | ICD-10-CM | POA: Diagnosis not present

## 2018-06-18 DIAGNOSIS — J3089 Other allergic rhinitis: Secondary | ICD-10-CM | POA: Diagnosis not present

## 2018-06-18 DIAGNOSIS — J301 Allergic rhinitis due to pollen: Secondary | ICD-10-CM | POA: Diagnosis not present

## 2018-06-18 DIAGNOSIS — J3081 Allergic rhinitis due to animal (cat) (dog) hair and dander: Secondary | ICD-10-CM | POA: Diagnosis not present

## 2018-06-30 DIAGNOSIS — J3089 Other allergic rhinitis: Secondary | ICD-10-CM | POA: Diagnosis not present

## 2018-06-30 DIAGNOSIS — J3081 Allergic rhinitis due to animal (cat) (dog) hair and dander: Secondary | ICD-10-CM | POA: Diagnosis not present

## 2018-06-30 DIAGNOSIS — J301 Allergic rhinitis due to pollen: Secondary | ICD-10-CM | POA: Diagnosis not present

## 2018-07-15 DIAGNOSIS — J3089 Other allergic rhinitis: Secondary | ICD-10-CM | POA: Diagnosis not present

## 2018-07-15 DIAGNOSIS — J301 Allergic rhinitis due to pollen: Secondary | ICD-10-CM | POA: Diagnosis not present

## 2018-07-15 DIAGNOSIS — J3081 Allergic rhinitis due to animal (cat) (dog) hair and dander: Secondary | ICD-10-CM | POA: Diagnosis not present

## 2018-07-30 DIAGNOSIS — J3089 Other allergic rhinitis: Secondary | ICD-10-CM | POA: Diagnosis not present

## 2018-07-30 DIAGNOSIS — J3081 Allergic rhinitis due to animal (cat) (dog) hair and dander: Secondary | ICD-10-CM | POA: Diagnosis not present

## 2018-07-30 DIAGNOSIS — J301 Allergic rhinitis due to pollen: Secondary | ICD-10-CM | POA: Diagnosis not present

## 2018-08-06 DIAGNOSIS — J3081 Allergic rhinitis due to animal (cat) (dog) hair and dander: Secondary | ICD-10-CM | POA: Diagnosis not present

## 2018-08-06 DIAGNOSIS — J301 Allergic rhinitis due to pollen: Secondary | ICD-10-CM | POA: Diagnosis not present

## 2018-08-06 DIAGNOSIS — J3089 Other allergic rhinitis: Secondary | ICD-10-CM | POA: Diagnosis not present

## 2018-08-11 ENCOUNTER — Encounter: Payer: Self-pay | Admitting: Internal Medicine

## 2018-08-12 DIAGNOSIS — J3089 Other allergic rhinitis: Secondary | ICD-10-CM | POA: Diagnosis not present

## 2018-08-12 DIAGNOSIS — J301 Allergic rhinitis due to pollen: Secondary | ICD-10-CM | POA: Diagnosis not present

## 2018-08-12 DIAGNOSIS — J3081 Allergic rhinitis due to animal (cat) (dog) hair and dander: Secondary | ICD-10-CM | POA: Diagnosis not present

## 2018-08-26 DIAGNOSIS — J3089 Other allergic rhinitis: Secondary | ICD-10-CM | POA: Diagnosis not present

## 2018-08-26 DIAGNOSIS — J301 Allergic rhinitis due to pollen: Secondary | ICD-10-CM | POA: Diagnosis not present

## 2018-08-26 DIAGNOSIS — J3081 Allergic rhinitis due to animal (cat) (dog) hair and dander: Secondary | ICD-10-CM | POA: Diagnosis not present

## 2018-09-07 ENCOUNTER — Ambulatory Visit: Payer: BLUE CROSS/BLUE SHIELD | Admitting: Internal Medicine

## 2018-09-08 ENCOUNTER — Encounter: Payer: Self-pay | Admitting: Internal Medicine

## 2018-09-09 DIAGNOSIS — J3081 Allergic rhinitis due to animal (cat) (dog) hair and dander: Secondary | ICD-10-CM | POA: Diagnosis not present

## 2018-09-09 DIAGNOSIS — J301 Allergic rhinitis due to pollen: Secondary | ICD-10-CM | POA: Diagnosis not present

## 2018-09-09 DIAGNOSIS — J3089 Other allergic rhinitis: Secondary | ICD-10-CM | POA: Diagnosis not present

## 2018-09-11 ENCOUNTER — Ambulatory Visit: Payer: BLUE CROSS/BLUE SHIELD | Admitting: Internal Medicine

## 2018-09-23 DIAGNOSIS — J301 Allergic rhinitis due to pollen: Secondary | ICD-10-CM | POA: Diagnosis not present

## 2018-09-23 DIAGNOSIS — J3089 Other allergic rhinitis: Secondary | ICD-10-CM | POA: Diagnosis not present

## 2018-09-23 DIAGNOSIS — J3081 Allergic rhinitis due to animal (cat) (dog) hair and dander: Secondary | ICD-10-CM | POA: Diagnosis not present

## 2018-10-06 DIAGNOSIS — H1045 Other chronic allergic conjunctivitis: Secondary | ICD-10-CM | POA: Diagnosis not present

## 2018-10-06 DIAGNOSIS — J3089 Other allergic rhinitis: Secondary | ICD-10-CM | POA: Diagnosis not present

## 2018-10-06 DIAGNOSIS — J3081 Allergic rhinitis due to animal (cat) (dog) hair and dander: Secondary | ICD-10-CM | POA: Diagnosis not present

## 2018-10-06 DIAGNOSIS — J301 Allergic rhinitis due to pollen: Secondary | ICD-10-CM | POA: Diagnosis not present

## 2018-10-13 DIAGNOSIS — J3081 Allergic rhinitis due to animal (cat) (dog) hair and dander: Secondary | ICD-10-CM | POA: Diagnosis not present

## 2018-10-13 DIAGNOSIS — J3089 Other allergic rhinitis: Secondary | ICD-10-CM | POA: Diagnosis not present

## 2018-10-13 DIAGNOSIS — J301 Allergic rhinitis due to pollen: Secondary | ICD-10-CM | POA: Diagnosis not present

## 2018-10-14 ENCOUNTER — Telehealth: Payer: Self-pay | Admitting: Internal Medicine

## 2018-10-14 NOTE — Telephone Encounter (Signed)
New message   Patient called about appt on 04.20.20 with Dr. Johney Frame. Pt informed it would be a Mychart Video Visit. Sent pt consent through Mychart and informed pt instructions were in the letter.

## 2018-10-19 ENCOUNTER — Telehealth (INDEPENDENT_AMBULATORY_CARE_PROVIDER_SITE_OTHER): Payer: BLUE CROSS/BLUE SHIELD | Admitting: Internal Medicine

## 2018-10-19 ENCOUNTER — Encounter: Payer: Self-pay | Admitting: Internal Medicine

## 2018-10-19 ENCOUNTER — Other Ambulatory Visit: Payer: Self-pay

## 2018-10-19 VITALS — BP 135/80 | HR 64 | Wt 223.0 lb

## 2018-10-19 DIAGNOSIS — I10 Essential (primary) hypertension: Secondary | ICD-10-CM

## 2018-10-19 DIAGNOSIS — I483 Typical atrial flutter: Secondary | ICD-10-CM | POA: Diagnosis not present

## 2018-10-19 MED ORDER — DILTIAZEM HCL 30 MG PO TABS: ORAL_TABLET | ORAL | 11 refills | Status: DC

## 2018-10-19 NOTE — Progress Notes (Signed)
Electrophysiology TeleHealth Note   Due to national recommendations of social distancing due to COVID 19, an audio/video telehealth visit is felt to be most appropriate for this patient at this time.  See MyChart message from today for the patient's consent to telehealth for Walter [name].   Date:  10/19/2018   ID:  Walter, [name] 1955-10-14, MRN [id number]  Location: patient's home  Provider location: [address] [place]  Evaluation Performed: Follow-up visit  PCP:  Clovis Riley, L.August Saucer, MD   Electrophysiologist:  Dr Johney Frame  Chief Complaint:  Atrial flutter  History of Present Illness:    Walter [name] is a 63 y.o. male who presents via audio/video conferencing for a telehealth visit today.  Since last being seen in our clinic, the patient reports doing very well.  He remains active.  Continues to play tennis and paddle board.  He has recently noted that his BP has been elevated.  He has been traveling in [place] and plans to go to [place] in August.  He also [place].Today, he denies symptoms of palpitations, chest pain, shortness of breath,  lower extremity edema, dizziness, presyncope, or syncope.  The patient is otherwise without complaint today.  The patient denies symptoms of fevers, chills, cough, or new SOB worrisome for COVID 19.  Past Medical History:  Diagnosis Date  . Atrial flutter (HCC)   . Dizzy 09/25/13  . Near syncope 09/25/13    Past Surgical History:  Procedure Laterality Date  . CARDIOVERSION N/A 09/27/2013   Procedure: CARDIOVERSION;  Surgeon: Lewayne Bunting, MD;  Location: [name] ENDOSCOPY;  Service: Cardiovascular;  Laterality: N/A;  . RETINAL DETACHMENT SURGERY    . right knee arthroscopy    . TEE WITHOUT CARDIOVERSION N/A 09/27/2013   Procedure: TRANSESOPHAGEAL ECHOCARDIOGRAM (TEE);  Surgeon: Lewayne Bunting, MD;  Location: [facility] ENDOSCOPY;  Service: Cardiovascular;  Laterality: N/A;  . WRIST  SURGERY      Current Outpatient Medications  Medication Sig Dispense Refill  . aspirin EC 81 MG EC tablet Take 1 tablet (81 mg total) by mouth daily.     No current facility-administered medications for this visit.     Allergies:   Patient has no known allergies.   Social History:  The patient  reports that he has never smoked. He has never used smokeless tobacco. He reports current alcohol use. He reports that he does not use drugs.   Family History:  The patient's  family history includes Healthy in his brother and sister; Heart disease in his father and mother.   ROS:  Please see the history of present illness.   All other systems are personally reviewed and negative.    Exam:    Vital Signs:  BP 135/80   Pulse 64   Wt 223 lb (101.2 kg)   BMI 31.10 kg/m   Well appearing, alert and conversant, regular work of breathing,  good skin color Eyes- anicteric, neuro- grossly intact, skin- no apparent rash or lesions or cyanosis, mouth- oral mucosa is pink   Labs/Other Tests and Data Reviewed:    Recent Labs: No results found for requested labs within last 8760 hours.   Wt Readings from Last 3 Encounters:  10/19/18 223 lb (101.2 kg)  09/01/17 223 lb (101.2 kg)  07/31/16 232 lb (105.2 kg)     Other studies personally reviewed: Additional studies/ records that were reviewed today include: my prior office notes  Review of the above records today demonstrates: as above     ASSESSMENT & PLAN:    1.  HTN Newly elevated BP Lifestyle modification discussed  2. Atrial flutter Well controlled with lifestyle Will given diltiazem 30mg  tablets to use prn chads2vasc is 1 (newly diagnosed HTN as above) Will not start anticoagulation at this time  3. COVID 19 screen The patient denies symptoms of COVID 19 at this time.  The importance of social distancing was discussed today.  Follow-up:  12 months  Current medicines are reviewed at length with the patient today.   The  patient does not have concerns regarding his medicines.  The following changes were made today:  none  Labs/ tests ordered today include:  No orders of the defined types were placed in this encounter.   Patient Risk:  after full review of this patients clinical status, I feel that they are at moderate risk at this time.  Today, I have spent 15 minutes with the patient with telehealth technology discussing afib and new HTN .    Walter Idol, MD  10/19/2018 10:57 AM     [facility] [address] [place] [address] [phone] (office) [phone] (fax)

## 2018-10-19 NOTE — Progress Notes (Signed)
Please given him diltiazem 30mg  tablets to take 1-2 pills q 4 hours prn HR>100 bpm

## 2018-10-21 DIAGNOSIS — J3081 Allergic rhinitis due to animal (cat) (dog) hair and dander: Secondary | ICD-10-CM | POA: Diagnosis not present

## 2018-10-21 DIAGNOSIS — J301 Allergic rhinitis due to pollen: Secondary | ICD-10-CM | POA: Diagnosis not present

## 2018-10-21 DIAGNOSIS — J3089 Other allergic rhinitis: Secondary | ICD-10-CM | POA: Diagnosis not present

## 2018-11-05 DIAGNOSIS — J3089 Other allergic rhinitis: Secondary | ICD-10-CM | POA: Diagnosis not present

## 2018-11-05 DIAGNOSIS — J301 Allergic rhinitis due to pollen: Secondary | ICD-10-CM | POA: Diagnosis not present

## 2018-11-05 DIAGNOSIS — J3081 Allergic rhinitis due to animal (cat) (dog) hair and dander: Secondary | ICD-10-CM | POA: Diagnosis not present

## 2018-11-13 DIAGNOSIS — J3081 Allergic rhinitis due to animal (cat) (dog) hair and dander: Secondary | ICD-10-CM | POA: Diagnosis not present

## 2018-11-13 DIAGNOSIS — J3089 Other allergic rhinitis: Secondary | ICD-10-CM | POA: Diagnosis not present

## 2018-11-13 DIAGNOSIS — J301 Allergic rhinitis due to pollen: Secondary | ICD-10-CM | POA: Diagnosis not present

## 2018-11-24 ENCOUNTER — Ambulatory Visit (INDEPENDENT_AMBULATORY_CARE_PROVIDER_SITE_OTHER): Payer: BLUE CROSS/BLUE SHIELD | Admitting: Orthopedic Surgery

## 2018-11-24 ENCOUNTER — Other Ambulatory Visit: Payer: Self-pay

## 2018-11-24 ENCOUNTER — Encounter: Payer: Self-pay | Admitting: Orthopedic Surgery

## 2018-11-24 VITALS — Ht 71.0 in | Wt 223.0 lb

## 2018-11-24 DIAGNOSIS — M25462 Effusion, left knee: Secondary | ICD-10-CM | POA: Diagnosis not present

## 2018-11-26 ENCOUNTER — Encounter: Payer: Self-pay | Admitting: Orthopedic Surgery

## 2018-11-26 DIAGNOSIS — M25462 Effusion, left knee: Secondary | ICD-10-CM | POA: Diagnosis not present

## 2018-11-26 MED ORDER — METHYLPREDNISOLONE ACETATE 40 MG/ML IJ SUSP
40.0000 mg | INTRAMUSCULAR | Status: AC | PRN
  Administered 2018-11-26: 40 mg via INTRA_ARTICULAR

## 2018-11-26 MED ORDER — LIDOCAINE HCL 1 % IJ SOLN
5.0000 mL | INTRAMUSCULAR | Status: AC | PRN
  Administered 2018-11-26: 5 mL

## 2018-11-26 NOTE — Progress Notes (Signed)
Office Visit Note   Patient: Walter [name]           Date of Birth: Jun 04, 1956           MRN: [id number] Visit Date: 11/24/2018              Requested by: Clovis Riley, L.August Saucer, MD [address] [place], [place] [address] PCP: Clovis Riley, L.August Saucer, MD  Chief Complaint  Patient presents with  . Left Knee - Follow-up      HPI: Patient is a 63 year old gentleman who presents with recurrent effusion left knee decreased range of motion pain with weightbearing.  Denies any mechanical locking.  Assessment & Plan: Visit Diagnoses:  1. Effusion, left knee     Plan: Knee was aspirated and injected he tolerated this well follow-up as needed.  Follow-Up Instructions: Return if symptoms worsen or fail to improve.   Ortho Exam  Patient is alert, oriented, no adenopathy, well-dressed, normal affect, normal respiratory effort. Examination patient has an antalgic gait.  He has a tense effusion of the left knee attempted range of motion is painful palpation along the joint lines is painful.  After informed consent the knee was aspirated of near synovial fluid from the superior lateral portal and the knee was injected post aspiration injection patient had improved range of motion and improved function and symptoms.  Imaging: No results found. No images are attached to the encounter.  Labs: Lab Results  Component Value Date   HGBA1C 5.7 (H) 09/25/2013     Lab Results  Component Value Date   ALBUMIN 3.6 09/26/2013   ALBUMIN 4.1 09/25/2013    Body mass index is 31.1 kg/m.  Orders:  No orders of the defined types were placed in this encounter.  No orders of the defined types were placed in this encounter.    Procedures: Large Joint Inj: L knee on 11/26/2018 3:02 PM Indications: pain and diagnostic evaluation Details: 22 G 1.5 in needle, superolateral approach  Arthrogram: No  Medications: 5 mL lidocaine 1 %; 40 mg methylPREDNISolone acetate 40 MG/ML Aspirate: 80 mL  clear Outcome: tolerated well, no immediate complications Procedure, treatment alternatives, risks and benefits explained, specific risks discussed. Consent was given by the patient. Immediately prior to procedure a time out was called to verify the correct patient, procedure, equipment, support staff and site/side marked as required. Patient was prepped and draped in the usual sterile fashion.      Clinical Data: No additional findings.  ROS:  All other systems negative, except as noted in the HPI. Review of Systems  Objective: Vital Signs: Ht 5\' 11"  (1.803 m)   Wt 223 lb (101.2 kg)   BMI 31.10 kg/m   Specialty Comments:  No specialty comments available.  PMFS History: Patient Active Problem List   Diagnosis Date Noted  . Unilateral primary osteoarthritis, left knee 09/26/2016  . Atrial flutter (HCC) 09/25/2013  . Near syncope 09/25/2013   Past Medical History:  Diagnosis Date  . Atrial flutter (HCC)   . Dizzy 09/25/13  . Near syncope 09/25/13    Family History  Problem Relation Age of Onset  . Heart disease Mother   . Heart disease Father   . Healthy Sister   . Healthy Brother     Past Surgical History:  Procedure Laterality Date  . CARDIOVERSION N/A 09/27/2013   Procedure: CARDIOVERSION;  Surgeon: Lewayne Bunting, MD;  Location: [facility] ENDOSCOPY;  Service: Cardiovascular;  Laterality: N/A;  . RETINAL DETACHMENT SURGERY    .  right knee arthroscopy    . TEE WITHOUT CARDIOVERSION N/A 09/27/2013   Procedure: TRANSESOPHAGEAL ECHOCARDIOGRAM (TEE);  Surgeon: Lewayne Bunting, MD;  Location: [facility] ENDOSCOPY;  Service: Cardiovascular;  Laterality: N/A;  . WRIST SURGERY     Social History   Occupational History  . Not on file  Tobacco Use  . Smoking status: Never Smoker  . Smokeless tobacco: Never Used  Substance and Sexual Activity  . Alcohol use: Yes    Comment: occasionally  . Drug use: No  . Sexual activity: Not on file

## 2018-11-28 ENCOUNTER — Telehealth: Payer: Self-pay | Admitting: Cardiology

## 2018-11-28 NOTE — Telephone Encounter (Signed)
Called patient regarding recent office visit 5/26 and the potential exposure of Covid.  Patient states he wore a N95 mask during the visit and states he has no symptoms.  He is currently refusing testing at this time.  Gave him number [phone], if things change and needs to be tested.

## 2018-12-02 DIAGNOSIS — J301 Allergic rhinitis due to pollen: Secondary | ICD-10-CM | POA: Diagnosis not present

## 2018-12-02 DIAGNOSIS — J3089 Other allergic rhinitis: Secondary | ICD-10-CM | POA: Diagnosis not present

## 2018-12-02 DIAGNOSIS — J3081 Allergic rhinitis due to animal (cat) (dog) hair and dander: Secondary | ICD-10-CM | POA: Diagnosis not present

## 2018-12-09 DIAGNOSIS — J3089 Other allergic rhinitis: Secondary | ICD-10-CM | POA: Diagnosis not present

## 2018-12-09 DIAGNOSIS — J301 Allergic rhinitis due to pollen: Secondary | ICD-10-CM | POA: Diagnosis not present

## 2018-12-09 DIAGNOSIS — J3081 Allergic rhinitis due to animal (cat) (dog) hair and dander: Secondary | ICD-10-CM | POA: Diagnosis not present

## 2018-12-18 DIAGNOSIS — J3081 Allergic rhinitis due to animal (cat) (dog) hair and dander: Secondary | ICD-10-CM | POA: Diagnosis not present

## 2018-12-18 DIAGNOSIS — J301 Allergic rhinitis due to pollen: Secondary | ICD-10-CM | POA: Diagnosis not present

## 2018-12-18 DIAGNOSIS — J3089 Other allergic rhinitis: Secondary | ICD-10-CM | POA: Diagnosis not present

## 2018-12-24 DIAGNOSIS — J3089 Other allergic rhinitis: Secondary | ICD-10-CM | POA: Diagnosis not present

## 2018-12-24 DIAGNOSIS — J301 Allergic rhinitis due to pollen: Secondary | ICD-10-CM | POA: Diagnosis not present

## 2018-12-24 DIAGNOSIS — J3081 Allergic rhinitis due to animal (cat) (dog) hair and dander: Secondary | ICD-10-CM | POA: Diagnosis not present

## 2018-12-30 DIAGNOSIS — J301 Allergic rhinitis due to pollen: Secondary | ICD-10-CM | POA: Diagnosis not present

## 2018-12-30 DIAGNOSIS — J3089 Other allergic rhinitis: Secondary | ICD-10-CM | POA: Diagnosis not present

## 2018-12-30 DIAGNOSIS — J3081 Allergic rhinitis due to animal (cat) (dog) hair and dander: Secondary | ICD-10-CM | POA: Diagnosis not present

## 2019-01-07 DIAGNOSIS — J301 Allergic rhinitis due to pollen: Secondary | ICD-10-CM | POA: Diagnosis not present

## 2019-01-07 DIAGNOSIS — J3089 Other allergic rhinitis: Secondary | ICD-10-CM | POA: Diagnosis not present

## 2019-01-07 DIAGNOSIS — J3081 Allergic rhinitis due to animal (cat) (dog) hair and dander: Secondary | ICD-10-CM | POA: Diagnosis not present

## 2019-01-12 DIAGNOSIS — J301 Allergic rhinitis due to pollen: Secondary | ICD-10-CM | POA: Diagnosis not present

## 2019-01-12 DIAGNOSIS — J3089 Other allergic rhinitis: Secondary | ICD-10-CM | POA: Diagnosis not present

## 2019-01-13 DIAGNOSIS — Z03818 Encounter for observation for suspected exposure to other biological agents ruled out: Secondary | ICD-10-CM | POA: Diagnosis not present

## 2019-01-14 DIAGNOSIS — U071 COVID-19: Secondary | ICD-10-CM | POA: Diagnosis not present

## 2019-01-26 DIAGNOSIS — J301 Allergic rhinitis due to pollen: Secondary | ICD-10-CM | POA: Diagnosis not present

## 2019-01-26 DIAGNOSIS — J3081 Allergic rhinitis due to animal (cat) (dog) hair and dander: Secondary | ICD-10-CM | POA: Diagnosis not present

## 2019-01-26 DIAGNOSIS — J3089 Other allergic rhinitis: Secondary | ICD-10-CM | POA: Diagnosis not present

## 2019-02-05 DIAGNOSIS — J301 Allergic rhinitis due to pollen: Secondary | ICD-10-CM | POA: Diagnosis not present

## 2019-02-05 DIAGNOSIS — J3081 Allergic rhinitis due to animal (cat) (dog) hair and dander: Secondary | ICD-10-CM | POA: Diagnosis not present

## 2019-02-05 DIAGNOSIS — J3089 Other allergic rhinitis: Secondary | ICD-10-CM | POA: Diagnosis not present

## 2019-02-09 DIAGNOSIS — J3081 Allergic rhinitis due to animal (cat) (dog) hair and dander: Secondary | ICD-10-CM | POA: Diagnosis not present

## 2019-02-09 DIAGNOSIS — J3089 Other allergic rhinitis: Secondary | ICD-10-CM | POA: Diagnosis not present

## 2019-02-09 DIAGNOSIS — J301 Allergic rhinitis due to pollen: Secondary | ICD-10-CM | POA: Diagnosis not present

## 2019-02-18 DIAGNOSIS — J301 Allergic rhinitis due to pollen: Secondary | ICD-10-CM | POA: Diagnosis not present

## 2019-02-18 DIAGNOSIS — J3081 Allergic rhinitis due to animal (cat) (dog) hair and dander: Secondary | ICD-10-CM | POA: Diagnosis not present

## 2019-02-18 DIAGNOSIS — J3089 Other allergic rhinitis: Secondary | ICD-10-CM | POA: Diagnosis not present

## 2019-03-12 DIAGNOSIS — M25511 Pain in right shoulder: Secondary | ICD-10-CM | POA: Diagnosis not present

## 2019-03-12 DIAGNOSIS — M19032 Primary osteoarthritis, left wrist: Secondary | ICD-10-CM | POA: Diagnosis not present

## 2019-03-12 DIAGNOSIS — M25532 Pain in left wrist: Secondary | ICD-10-CM | POA: Diagnosis not present

## 2019-03-29 DIAGNOSIS — M25532 Pain in left wrist: Secondary | ICD-10-CM | POA: Diagnosis not present

## 2019-03-29 DIAGNOSIS — M19032 Primary osteoarthritis, left wrist: Secondary | ICD-10-CM | POA: Diagnosis not present

## 2019-04-01 DIAGNOSIS — J3089 Other allergic rhinitis: Secondary | ICD-10-CM | POA: Diagnosis not present

## 2019-04-01 DIAGNOSIS — J301 Allergic rhinitis due to pollen: Secondary | ICD-10-CM | POA: Diagnosis not present

## 2019-04-01 DIAGNOSIS — J3081 Allergic rhinitis due to animal (cat) (dog) hair and dander: Secondary | ICD-10-CM | POA: Diagnosis not present

## 2019-04-08 DIAGNOSIS — J301 Allergic rhinitis due to pollen: Secondary | ICD-10-CM | POA: Diagnosis not present

## 2019-04-08 DIAGNOSIS — J3089 Other allergic rhinitis: Secondary | ICD-10-CM | POA: Diagnosis not present

## 2019-04-08 DIAGNOSIS — J3081 Allergic rhinitis due to animal (cat) (dog) hair and dander: Secondary | ICD-10-CM | POA: Diagnosis not present

## 2019-04-21 DIAGNOSIS — J3089 Other allergic rhinitis: Secondary | ICD-10-CM | POA: Diagnosis not present

## 2019-04-21 DIAGNOSIS — J301 Allergic rhinitis due to pollen: Secondary | ICD-10-CM | POA: Diagnosis not present

## 2019-04-21 DIAGNOSIS — J3081 Allergic rhinitis due to animal (cat) (dog) hair and dander: Secondary | ICD-10-CM | POA: Diagnosis not present

## 2019-04-29 DIAGNOSIS — J301 Allergic rhinitis due to pollen: Secondary | ICD-10-CM | POA: Diagnosis not present

## 2019-04-29 DIAGNOSIS — J3081 Allergic rhinitis due to animal (cat) (dog) hair and dander: Secondary | ICD-10-CM | POA: Diagnosis not present

## 2019-04-29 DIAGNOSIS — J3089 Other allergic rhinitis: Secondary | ICD-10-CM | POA: Diagnosis not present

## 2019-05-19 DIAGNOSIS — J301 Allergic rhinitis due to pollen: Secondary | ICD-10-CM | POA: Diagnosis not present

## 2019-05-19 DIAGNOSIS — J3089 Other allergic rhinitis: Secondary | ICD-10-CM | POA: Diagnosis not present

## 2019-05-19 DIAGNOSIS — J3081 Allergic rhinitis due to animal (cat) (dog) hair and dander: Secondary | ICD-10-CM | POA: Diagnosis not present

## 2019-05-25 DIAGNOSIS — J3089 Other allergic rhinitis: Secondary | ICD-10-CM | POA: Diagnosis not present

## 2019-05-25 DIAGNOSIS — J301 Allergic rhinitis due to pollen: Secondary | ICD-10-CM | POA: Diagnosis not present

## 2019-05-25 DIAGNOSIS — J3081 Allergic rhinitis due to animal (cat) (dog) hair and dander: Secondary | ICD-10-CM | POA: Diagnosis not present

## 2019-06-04 DIAGNOSIS — J3089 Other allergic rhinitis: Secondary | ICD-10-CM | POA: Diagnosis not present

## 2019-06-04 DIAGNOSIS — J301 Allergic rhinitis due to pollen: Secondary | ICD-10-CM | POA: Diagnosis not present

## 2019-06-04 DIAGNOSIS — J3081 Allergic rhinitis due to animal (cat) (dog) hair and dander: Secondary | ICD-10-CM | POA: Diagnosis not present

## 2019-06-09 DIAGNOSIS — J301 Allergic rhinitis due to pollen: Secondary | ICD-10-CM | POA: Diagnosis not present

## 2019-06-09 DIAGNOSIS — J3081 Allergic rhinitis due to animal (cat) (dog) hair and dander: Secondary | ICD-10-CM | POA: Diagnosis not present

## 2019-06-09 DIAGNOSIS — J3089 Other allergic rhinitis: Secondary | ICD-10-CM | POA: Diagnosis not present

## 2019-06-15 DIAGNOSIS — J3081 Allergic rhinitis due to animal (cat) (dog) hair and dander: Secondary | ICD-10-CM | POA: Diagnosis not present

## 2019-06-15 DIAGNOSIS — J301 Allergic rhinitis due to pollen: Secondary | ICD-10-CM | POA: Diagnosis not present

## 2019-06-16 DIAGNOSIS — J3089 Other allergic rhinitis: Secondary | ICD-10-CM | POA: Diagnosis not present

## 2019-06-23 DIAGNOSIS — J3081 Allergic rhinitis due to animal (cat) (dog) hair and dander: Secondary | ICD-10-CM | POA: Diagnosis not present

## 2019-06-23 DIAGNOSIS — J301 Allergic rhinitis due to pollen: Secondary | ICD-10-CM | POA: Diagnosis not present

## 2019-06-23 DIAGNOSIS — J3089 Other allergic rhinitis: Secondary | ICD-10-CM | POA: Diagnosis not present

## 2019-07-08 DIAGNOSIS — J301 Allergic rhinitis due to pollen: Secondary | ICD-10-CM | POA: Diagnosis not present

## 2019-07-08 DIAGNOSIS — J3089 Other allergic rhinitis: Secondary | ICD-10-CM | POA: Diagnosis not present

## 2019-07-08 DIAGNOSIS — J3081 Allergic rhinitis due to animal (cat) (dog) hair and dander: Secondary | ICD-10-CM | POA: Diagnosis not present

## 2019-07-16 DIAGNOSIS — J301 Allergic rhinitis due to pollen: Secondary | ICD-10-CM | POA: Diagnosis not present

## 2019-07-16 DIAGNOSIS — J3081 Allergic rhinitis due to animal (cat) (dog) hair and dander: Secondary | ICD-10-CM | POA: Diagnosis not present

## 2019-07-16 DIAGNOSIS — J3089 Other allergic rhinitis: Secondary | ICD-10-CM | POA: Diagnosis not present

## 2019-07-26 DIAGNOSIS — J3089 Other allergic rhinitis: Secondary | ICD-10-CM | POA: Diagnosis not present

## 2019-07-26 DIAGNOSIS — J301 Allergic rhinitis due to pollen: Secondary | ICD-10-CM | POA: Diagnosis not present

## 2019-07-26 DIAGNOSIS — J3081 Allergic rhinitis due to animal (cat) (dog) hair and dander: Secondary | ICD-10-CM | POA: Diagnosis not present

## 2019-08-06 DIAGNOSIS — J3081 Allergic rhinitis due to animal (cat) (dog) hair and dander: Secondary | ICD-10-CM | POA: Diagnosis not present

## 2019-08-06 DIAGNOSIS — J301 Allergic rhinitis due to pollen: Secondary | ICD-10-CM | POA: Diagnosis not present

## 2019-08-06 DIAGNOSIS — J3089 Other allergic rhinitis: Secondary | ICD-10-CM | POA: Diagnosis not present

## 2019-08-12 DIAGNOSIS — J301 Allergic rhinitis due to pollen: Secondary | ICD-10-CM | POA: Diagnosis not present

## 2019-08-12 DIAGNOSIS — J3089 Other allergic rhinitis: Secondary | ICD-10-CM | POA: Diagnosis not present

## 2019-08-12 DIAGNOSIS — J3081 Allergic rhinitis due to animal (cat) (dog) hair and dander: Secondary | ICD-10-CM | POA: Diagnosis not present

## 2019-09-03 DIAGNOSIS — J3081 Allergic rhinitis due to animal (cat) (dog) hair and dander: Secondary | ICD-10-CM | POA: Diagnosis not present

## 2019-09-03 DIAGNOSIS — J3089 Other allergic rhinitis: Secondary | ICD-10-CM | POA: Diagnosis not present

## 2019-09-03 DIAGNOSIS — J301 Allergic rhinitis due to pollen: Secondary | ICD-10-CM | POA: Diagnosis not present

## 2019-09-29 DIAGNOSIS — J3089 Other allergic rhinitis: Secondary | ICD-10-CM | POA: Diagnosis not present

## 2019-09-29 DIAGNOSIS — J301 Allergic rhinitis due to pollen: Secondary | ICD-10-CM | POA: Diagnosis not present

## 2019-09-29 DIAGNOSIS — J3081 Allergic rhinitis due to animal (cat) (dog) hair and dander: Secondary | ICD-10-CM | POA: Diagnosis not present

## 2019-10-07 DIAGNOSIS — J3089 Other allergic rhinitis: Secondary | ICD-10-CM | POA: Diagnosis not present

## 2019-10-07 DIAGNOSIS — J301 Allergic rhinitis due to pollen: Secondary | ICD-10-CM | POA: Diagnosis not present

## 2019-10-07 DIAGNOSIS — H1045 Other chronic allergic conjunctivitis: Secondary | ICD-10-CM | POA: Diagnosis not present

## 2019-10-07 DIAGNOSIS — J3081 Allergic rhinitis due to animal (cat) (dog) hair and dander: Secondary | ICD-10-CM | POA: Diagnosis not present

## 2019-10-18 ENCOUNTER — Ambulatory Visit: Payer: BLUE CROSS/BLUE SHIELD | Admitting: Internal Medicine

## 2019-10-21 DIAGNOSIS — J3089 Other allergic rhinitis: Secondary | ICD-10-CM | POA: Diagnosis not present

## 2019-10-21 DIAGNOSIS — J301 Allergic rhinitis due to pollen: Secondary | ICD-10-CM | POA: Diagnosis not present

## 2019-10-21 DIAGNOSIS — J3081 Allergic rhinitis due to animal (cat) (dog) hair and dander: Secondary | ICD-10-CM | POA: Diagnosis not present

## 2019-10-27 DIAGNOSIS — J3081 Allergic rhinitis due to animal (cat) (dog) hair and dander: Secondary | ICD-10-CM | POA: Diagnosis not present

## 2019-10-27 DIAGNOSIS — J3089 Other allergic rhinitis: Secondary | ICD-10-CM | POA: Diagnosis not present

## 2019-10-27 DIAGNOSIS — J301 Allergic rhinitis due to pollen: Secondary | ICD-10-CM | POA: Diagnosis not present

## 2019-11-10 ENCOUNTER — Other Ambulatory Visit: Payer: Self-pay

## 2019-11-10 ENCOUNTER — Encounter: Payer: Self-pay | Admitting: Internal Medicine

## 2019-11-10 ENCOUNTER — Ambulatory Visit (INDEPENDENT_AMBULATORY_CARE_PROVIDER_SITE_OTHER): Payer: BC Managed Care – PPO | Admitting: Internal Medicine

## 2019-11-10 VITALS — BP 132/78 | HR 55 | Ht 71.5 in | Wt 214.0 lb

## 2019-11-10 DIAGNOSIS — I10 Essential (primary) hypertension: Secondary | ICD-10-CM | POA: Diagnosis not present

## 2019-11-10 DIAGNOSIS — J3081 Allergic rhinitis due to animal (cat) (dog) hair and dander: Secondary | ICD-10-CM | POA: Diagnosis not present

## 2019-11-10 DIAGNOSIS — I483 Typical atrial flutter: Secondary | ICD-10-CM

## 2019-11-10 DIAGNOSIS — J301 Allergic rhinitis due to pollen: Secondary | ICD-10-CM | POA: Diagnosis not present

## 2019-11-10 DIAGNOSIS — J3089 Other allergic rhinitis: Secondary | ICD-10-CM | POA: Diagnosis not present

## 2019-11-10 NOTE — Progress Notes (Signed)
   PCP: Clovis Riley, L.August Saucer, MD  Primary EP: Dr Johney Frame  Walter [name] is a 64 y.o. male who presents today for routine electrophysiology followup.  Since last being seen in our clinic, the patient reports doing very well.  Today, he denies symptoms of palpitations, chest pain, shortness of breath,  lower extremity edema, dizziness, presyncope, or syncope.  The patient is otherwise without complaint today.   Past Medical History:  Diagnosis Date  . Atrial flutter (HCC)   . Dizzy 09/25/13  . Near syncope 09/25/13   Past Surgical History:  Procedure Laterality Date  . CARDIOVERSION N/A 09/27/2013   Procedure: CARDIOVERSION;  Surgeon: Lewayne Bunting, MD;  Location: [facility] ENDOSCOPY;  Service: Cardiovascular;  Laterality: N/A;  . RETINAL DETACHMENT SURGERY    . right knee arthroscopy    . TEE WITHOUT CARDIOVERSION N/A 09/27/2013   Procedure: TRANSESOPHAGEAL ECHOCARDIOGRAM (TEE);  Surgeon: Lewayne Bunting, MD;  Location: [facility] ENDOSCOPY;  Service: Cardiovascular;  Laterality: N/A;  . WRIST SURGERY      ROS- all systems are reviewed and negatives except as per HPI above  Current Outpatient Medications  Medication Sig Dispense Refill  . aspirin EC 81 MG EC tablet Take 1 tablet (81 mg total) by mouth daily.    [name] diltiazem (CARDIZEM) 30 MG tablet Take 1-2 tablets by mouth every 4 hours as needed for heart rate greater than 100 beats per minute 30 tablet 11   No current facility-administered medications for this visit.    Physical Exam: Vitals:   11/10/19 1617  BP: 132/78  Pulse: (!) 55  SpO2: 98%  Weight: 214 lb (97.1 kg)  Height: 5' 11.5" (1.816 m)    GEN- The patient is well appearing, alert and oriented x 3 today.   Head- normocephalic, atraumatic Eyes-  Sclera clear, conjunctiva pink Ears- hearing intact Oropharynx- clear Lungs-   normal work of breathing Heart- Regular rate and rhythm  GI- soft  Extremities- no clubbing, cyanosis, or edema  Wt Readings from Last 3 Encounters:    11/10/19 214 lb (97.1 kg)  11/24/18 223 lb (101.2 kg)  10/19/18 223 lb (101.2 kg)    EKG tracing ordered today is personally reviewed and shows sinus  Assessment and Plan:  1. Atrial flutter chads2vasc score is 1.  Doing well at this time He is not on [facility] therapy  2. Hypertensive cardiovascualr diease Stable No change required today   Return to see me in a year  Hillis Range MD, [facility] 11/10/2019 4:21 PM

## 2019-11-10 NOTE — Patient Instructions (Signed)
Medication Instructions:  Your physician recommends that you continue on your current medications as directed. Please refer to the Current Medication list given to you today.  *If you need a refill on your cardiac medications before your next appointment, please call your pharmacy*   Lab Work: None ordered If you have labs (blood work) drawn today and your tests are completely normal, you will receive your results only by: . MyChart Message (if you have MyChart) OR . A paper copy in the mail If you have any lab test that is abnormal or we need to change your treatment, we will call you to review the results.   Testing/Procedures: None ordered   Follow-Up: At [facility], you and your health needs are our priority.  As part of our continuing mission to provide you with exceptional heart care, we have created designated Provider Care Teams.  These Care Teams include your primary Cardiologist (physician) and Advanced Practice Providers (APPs -  Physician Assistants and Nurse Practitioners) who all work together to provide you with the care you need, when you need it.  We recommend signing up for the patient portal called "MyChart".  Sign up information is provided on this After Visit Summary.  MyChart is used to connect with patients for Virtual Visits (Telemedicine).  Patients are able to view lab/test results, encounter notes, upcoming appointments, etc.  Non-urgent messages can be sent to your provider as well.   To learn more about what you can do with MyChart, go to [email or web address].    Your next appointment:   1 year(s)  The format for your next appointment:   In Person  Provider:   James Allred, MD   Thank you for choosing [facility]!!     Other Instructions    

## 2019-11-18 DIAGNOSIS — Z0000 Encounter for general adult medical examination without abnormal findings: Secondary | ICD-10-CM | POA: Diagnosis not present

## 2019-11-18 DIAGNOSIS — Z23 Encounter for immunization: Secondary | ICD-10-CM | POA: Diagnosis not present

## 2019-12-13 ENCOUNTER — Other Ambulatory Visit: Payer: Self-pay

## 2019-12-13 ENCOUNTER — Encounter (HOSPITAL_COMMUNITY): Payer: Self-pay

## 2019-12-13 ENCOUNTER — Encounter (HOSPITAL_COMMUNITY): Payer: Self-pay | Admitting: Nurse Practitioner

## 2019-12-13 ENCOUNTER — Ambulatory Visit (HOSPITAL_BASED_OUTPATIENT_CLINIC_OR_DEPARTMENT_OTHER)
Admission: RE | Admit: 2019-12-13 | Discharge: 2019-12-13 | Disposition: A | Discharge status: Home / Self Care | Payer: BC Managed Care – PPO | Source: Ambulatory Visit | Attending: Nurse Practitioner | Admitting: Nurse Practitioner

## 2019-12-13 ENCOUNTER — Emergency Department (HOSPITAL_COMMUNITY)
Admission: EM | Admit: 2019-12-13 | Discharge: 2019-12-13 | Disposition: A | Discharge status: Home / Self Care | Payer: BC Managed Care – PPO | Attending: Emergency Medicine | Admitting: Emergency Medicine

## 2019-12-13 VITALS — BP 110/80 | HR 68 | Ht 71.5 in | Wt 216.8 lb

## 2019-12-13 DIAGNOSIS — I483 Typical atrial flutter: Secondary | ICD-10-CM

## 2019-12-13 DIAGNOSIS — Z79899 Other long term (current) drug therapy: Secondary | ICD-10-CM | POA: Diagnosis not present

## 2019-12-13 DIAGNOSIS — I4891 Unspecified atrial fibrillation: Secondary | ICD-10-CM

## 2019-12-13 DIAGNOSIS — Z0189 Encounter for other specified special examinations: Secondary | ICD-10-CM

## 2019-12-13 DIAGNOSIS — R002 Palpitations: Secondary | ICD-10-CM | POA: Diagnosis not present

## 2019-12-13 DIAGNOSIS — I4892 Unspecified atrial flutter: Secondary | ICD-10-CM | POA: Diagnosis not present

## 2019-12-13 LAB — BASIC METABOLIC PANEL
Anion gap: 10 (ref 5–15)
BUN: 13 mg/dL (ref 8–23)
CO2: 27 mmol/L (ref 22–32)
Calcium: 9.8 mg/dL (ref 8.9–10.3)
Chloride: 102 mmol/L (ref 98–111)
Creatinine, Ser: 1.04 mg/dL (ref 0.61–1.24)
GFR calc Af Amer: >60 mL/min (ref >60)
GFR calc non Af Amer: >60 mL/min (ref >60)
Glucose, Bld: 103 mg/dL — ABNORMAL HIGH (ref 70–99)
Potassium: 4.3 mmol/L (ref 3.5–5.1)
Sodium: 139 mmol/L (ref 135–145)

## 2019-12-13 LAB — CBC
HCT: 48.7 % (ref 39.0–52.0)
Hemoglobin: 16.3 g/dL (ref 13.0–17.0)
MCH: 32.7 pg (ref 26.0–34.0)
MCHC: 33.5 g/dL (ref 30.0–36.0)
MCV: 97.8 fL (ref 80.0–100.0)
Platelets: 243 10*3/uL (ref 150–400)
RBC: 4.98 MIL/uL (ref 4.22–5.81)
RDW: 13 % (ref 11.5–15.5)
WBC: 4.7 10*3/uL (ref 4.0–10.5)
nRBC: 0 % (ref 0.0–0.2)

## 2019-12-13 MED ORDER — DILTIAZEM HCL 100 MG IV SOLR
5.0000 mg/h | INTRAVENOUS | Status: DC
  Filled 2019-12-13 (×2): qty 100

## 2019-12-13 MED ORDER — SODIUM CHLORIDE 0.9 % IV BOLUS
1000.0000 mL | Freq: Once | INTRAVENOUS | Status: AC
  Administered 2019-12-13: 1000 mL via INTRAVENOUS

## 2019-12-13 MED ORDER — PROPOFOL 10 MG/ML IV BOLUS
0.5000 mg/kg | Freq: Once | INTRAVENOUS | Status: DC
  Filled 2019-12-13: qty 20

## 2019-12-13 MED ORDER — PROPOFOL 10 MG/ML IV BOLUS
INTRAVENOUS | Status: AC | PRN
  Administered 2019-12-13: 60 mg via INTRAVENOUS

## 2019-12-13 MED ORDER — DILTIAZEM LOAD VIA INFUSION
20.0000 mg | Freq: Once | INTRAVENOUS | Status: DC
  Filled 2019-12-13: qty 20

## 2019-12-13 MED ORDER — APIXABAN 5 MG PO TABS
5.0000 mg | ORAL_TABLET | Freq: Once | ORAL | Status: AC
  Administered 2019-12-13: 5 mg via ORAL
  Filled 2019-12-13: qty 1

## 2019-12-13 MED ORDER — APIXABAN 5 MG PO TABS
5.0000 mg | ORAL_TABLET | Freq: Two times a day (BID) | ORAL | 0 refills | Status: DC
Start: 2019-12-13 — End: 2020-01-17

## 2019-12-13 NOTE — Discharge Instructions (Addendum)
It was our pleasure to provide your ER care today - we hope that you feel better.  Take eliquis as prescribed.   Follow up with your cardiologist  as arranged with them.  Return to ER if worse, new symptoms, fevers, chest pain, trouble breathing, change in speech or vision, numbness/weakness, or other concern.   You were given sedating medication in the ER today - no driving for the next 12 hours.

## 2019-12-13 NOTE — ED Triage Notes (Signed)
Pt arrives to ED w/ c/o a flutter, sent from heart clinic. Pt endorses sob w/ exertion, denies chest pain.

## 2019-12-13 NOTE — ED Notes (Signed)
Patient verbalizes understanding of discharge instructions. Opportunity for questioning and answers were provided. Armband removed by staff, pt discharged from ED  Emphasized to adhere to Eliquis as prescribed Emphasized follow-up with Cardiologist

## 2019-12-13 NOTE — Addendum Note (Signed)
Encounter addended by: Newman Nip, NP on: 12/13/2019 2:43 PM  Actions taken: Clinical Note Signed

## 2019-12-13 NOTE — Consult Note (Addendum)
Cardiology Admission History and Physical:   Patient ID: Walter [name] MRN: [id number]; DOB: 1956-03-02   Admission date: 12/13/2019  Primary Care Provider: Clovis Riley, L.August Saucer, MD [facility] Cardiologist: No primary care provider on file.  [facility] Electrophysiologist:  Hillis Range, MD   Chief Complaint: Aflutter  Patient Profile:   Walter [name] is a 64 y.o. male with h/o of remote aflutter s/p DCCV in 2015 not on a/c due to [facility] of 0 who was sent to the ED for cardioversion for alutter.   History of Present Illness:   Walter [name] was seen in the afib clinic today for afib/flutter. The patient was at a wedding over the weekend and and drank more alcohol than usual. On Saturday at 2:30- 3PM he felt subtle palpitations on the left side of his chest. His watch said his heart rates were very labile which is different from a baseline of around 55bpm and he suspected he was in afib. He had no chest pain, sob, lightheadedness, or dizziness. He took 2 days of cardizem but irregular heat beat continued. Palpitations seemed to come and go.   In afib clinic EKG showed aflutter and he was sent over to the ED for cardioversion before 3 PM. In the ED BP 128/83, pulse 68, afebrile, RR 20, 100% O2. BMET pending. CBC unremarkable.   On my interview patient is comfortable with no chest pain or sob. He denies fever, chills, LLE, orthopnea.    Past Medical History:  Diagnosis Date  . Atrial flutter (HCC)   . Dizzy 09/25/13  . Near syncope 09/25/13    Past Surgical History:  Procedure Laterality Date  . CARDIOVERSION N/A 09/27/2013   Procedure: CARDIOVERSION;  Surgeon: Lewayne Bunting, MD;  Location: [facility] ENDOSCOPY;  Service: Cardiovascular;  Laterality: N/A;  . RETINAL DETACHMENT SURGERY    . right knee arthroscopy    . TEE WITHOUT CARDIOVERSION N/A 09/27/2013   Procedure: TRANSESOPHAGEAL ECHOCARDIOGRAM (TEE);  Surgeon: Lewayne Bunting, MD;  Location: [facility] ENDOSCOPY;  Service:  Cardiovascular;  Laterality: N/A;  . WRIST SURGERY       Medications Prior to Admission: Prior to Admission medications   Medication Sig Start Date End Date Taking? Authorizing Provider  diltiazem (CARDIZEM) 30 MG tablet Take 1-2 tablets by mouth every 4 hours as needed for heart rate greater than 100 beats per minute 10/19/18   Hillis Range, MD     Allergies:    Allergies  Allergen Reactions  . Latex Itching    Skin turns real red    Social History:   Social History   Socioeconomic History  . Marital status: Married    Spouse name: Not on file  . Number of children: Not on file  . Years of education: Not on file  . Highest education level: Not on file  Occupational History  . Not on file  Tobacco Use  . Smoking status: Never Smoker  . Smokeless tobacco: Never Used  Substance and Sexual Activity  . Alcohol use: Yes    Comment: occasionally  . Drug use: No  . Sexual activity: Not on file  Other Topics Concern  . Not on file  Social History Narrative   Pt lives in [place].  Works as a Copywriter, advertising for [employer].   Married. 2 children   Social Determinants of Health   Financial Resource Strain:   . Difficulty of Paying Living Expenses:   Food Insecurity:   . Worried About Programme researcher, broadcasting/film/video  in the Last Year:   . [place] in the Last Year:   Transportation Needs:   . Film/video editor (Medical):   [name] Lack of Transportation (Non-Medical):   Physical Activity:   . Days of Exercise per Week:   . Minutes of Exercise per Session:   Stress:   . Feeling of Stress :   Social Connections:   . Frequency of Communication with Friends and Family:   . Frequency of Social Gatherings with Friends and Family:   . Attends Religious Services:   . Active Member of Clubs or Organizations:   . Attends Archivist Meetings:   [name] Marital Status:   Intimate Partner Violence:   . Fear of Current or Ex-Partner:   . Emotionally Abused:   [name] Physically  Abused:   . Sexually Abused:     Family History:   The patient's family history includes Healthy in his brother and sister; Heart disease in his father and mother.    ROS:  Please see the history of present illness.  All other ROS reviewed and negative.     Physical Exam/Data:   Vitals:   12/13/19 1127 12/13/19 1128  BP: 128/83   Pulse: 68   Resp: 20   Temp: 97.8 F (36.6 C)   TempSrc: Oral   SpO2: 100%   Height:  6' (1.829 m)   No intake or output data in the 24 hours ending 12/13/19 1310 Last 3 Weights 12/13/2019 11/10/2019 11/24/2018  Weight (lbs) 216 lb 12.8 oz 214 lb 223 lb  Weight (kg) 98.34 kg 97.07 kg 101.152 kg     Body mass index is 29.4 kg/m.  General:  Well nourished, well developed, in no acute distress HEENT: normal Lymph: no adenopathy Neck: no JVD Endocrine:  No thryomegaly Vascular: No carotid bruits; FA pulses 2+ bilaterally without bruits  Cardiac:  normal S1, S2; Reg Irreg; no murmur  Lungs:  clear to auscultation bilaterally, no wheezing, rhonchi or rales  Abd: soft, nontender, no hepatomegaly  Ext: no edema Musculoskeletal:  No deformities, BUE and BLE strength normal and equal Skin: warm and dry  Neuro:  CNs 2-12 intact, no focal abnormalities noted Psych:  Normal affect    EKG:  The ECG that was done 12/13/19 was personally reviewed and demonstrates Aflutter, 4:1 block, Hr 68 bpm.   Relevant CV Studies:  Echo 08/2016 Study Conclusions   - Left ventricle: The cavity size was normal. Wall thickness was  normal. Systolic function was normal. The estimated ejection  fraction was in the range of 55% to 60%. Wall motion was normal;  there were no regional wall motion abnormalities. Doppler  parameters are consistent with abnormal left ventricular  relaxation (grade 1 diastolic dysfunction). The E/e&' ratio is <8,  suggesting normal LV filling pressure.  - Mitral valve: Mildly thickened leaflets . There was trivial  regurgitation.   - Right atrium: The atrium was mildly dilated.  - Tricuspid valve: There was trivial regurgitation.  - Pulmonary arteries: PA peak pressure: 22 mm Hg (S).  - Inferior vena cava: The vessel was normal in size. The  respirophasic diameter changes were in the normal range (>= 50%),  consistent with normal central venous pressure.   Impressions:   - Compared to a prior study in 2015, the LVEF has normalized.   Laboratory Data:  High Sensitivity Troponin:  No results for input(s): TROPONINIHS in the last 720 hours.    ChemistryNo results for input(s): NA,  K, CL, CO2, GLUCOSE, BUN, CREATININE, CALCIUM, GFRNONAA, GFRAA, ANIONGAP in the last 168 hours.  No results for input(s): PROT, ALBUMIN, AST, ALT, ALKPHOS, BILITOT in the last 168 hours. HematologyNo results for input(s): WBC, RBC, HGB, HCT, MCV, MCH, MCHC, RDW, PLT in the last 168 hours. BNPNo results for input(s): BNP, PROBNP in the last 168 hours.  DDimer No results for input(s): DDIMER in the last 168 hours.   Radiology/Studies:  No results found. {  Assessment and Plan:   H/o of afib not on anticoagulation (CHADSVASC of 0) now in aflutter - On Saturday ( 2 days ago) around 2:30 or 3 pm patient noticed palpitations and his watch showed irregular heart rate/rhythm. He took Diltiazem 30 mg BID  - He was seen in the afib clinic today by Rudi Coco who recommended cardioversion by 3PM - In the ER EKG shows Aflutter - rates are controlled in the 70s, BP stable. No chest pain or sob - Not on anticoagulation at baseline with CHADSVASC of 0 - Echo in 2018 showed preserved EF with G1DD This patients CHA2DS2-VASc Score and unadjusted Ischemic Stroke Rate (% per year) is equal to 0.2 % stroke rate/year from a score of 0  Above score calculated as 1 point each if present [CHF, HTN, DM, Vascular=MI/PAD/Aortic Plaque, Age if 65-74, or Male] Above score calculated as 2 points each if present [Age > 75, or Stroke/TIA/TE] - Patient  seems to be requesting cardioversion however this could also likely be done as outpatient given that he is stable. He is asymptomatic. He will have to be on uninterrupted blood thinner for 4 weeks post DCCV. Will discuss plan with MD.     For questions or updates, please contact [facility] Please consult [email or web address] for contact info under     Signed, Kaarin Pardy David Stall, PA-C  12/13/2019 1:10 PM

## 2019-12-13 NOTE — ED Provider Notes (Addendum)
[facility] EMERGENCY DEPARTMENT Provider Note   CSN: [id number] Arrival date & time: 12/13/19  1117     History Chief Complaint  Patient presents with  . Atrial Flutter    Walter [name] is a 64 y.o. male.  Patient sent from cardiology clinic to ED with atrial flutter.  Symptoms acute onset two days ago, moderate, constant, persistent. Patient indicates history of same approximately 5 years ago. Indicates that 2 days ago, he noted onset a flutter - timing of onset symptoms a bit unclear - pt states he exercised 2 days ago in AM, and felt fine, and states afib/flutter started later that afternoon.  States was away at wedding, so increased etoh intake as compared to baseline. States started taking his cardizem in past two days but irregular/fast heartbeat continued. Denies current or recent chest pain or discomfort. No sob. No leg swelling or pain. No fever or chills. No cough or uri symptoms.   The history is provided by the patient.       Past Medical History:  Diagnosis Date  . Atrial flutter (HCC)   . Dizzy 09/25/13  . Near syncope 09/25/13    Patient Active Problem List   Diagnosis Date Noted  . Unilateral primary osteoarthritis, left knee 09/26/2016  . Atrial flutter (HCC) 09/25/2013  . Near syncope 09/25/2013    Past Surgical History:  Procedure Laterality Date  . CARDIOVERSION N/A 09/27/2013   Procedure: CARDIOVERSION;  Surgeon: Lewayne Bunting, MD;  Location: [facility] ENDOSCOPY;  Service: Cardiovascular;  Laterality: N/A;  . RETINAL DETACHMENT SURGERY    . right knee arthroscopy    . TEE WITHOUT CARDIOVERSION N/A 09/27/2013   Procedure: TRANSESOPHAGEAL ECHOCARDIOGRAM (TEE);  Surgeon: Lewayne Bunting, MD;  Location: [facility] ENDOSCOPY;  Service: Cardiovascular;  Laterality: N/A;  . WRIST SURGERY         Family History  Problem Relation Age of Onset  . Heart disease Mother   . Heart disease Father   . Healthy Sister   . Healthy Brother     Social  History   Tobacco Use  . Smoking status: Never Smoker  . Smokeless tobacco: Never Used  Substance Use Topics  . Alcohol use: Yes    Comment: occasionally  . Drug use: No    Home Medications Prior to Admission medications   Medication Sig Start Date End Date Taking? Authorizing Provider  diltiazem (CARDIZEM) 30 MG tablet Take 1-2 tablets by mouth every 4 hours as needed for heart rate greater than 100 beats per minute 10/19/18   Allred, Fayrene Fearing, MD    Allergies    Latex  Review of Systems   Review of Systems  Constitutional: Negative for fever.  HENT: Negative for sore throat.   Eyes: Negative for redness.  Respiratory: Negative for cough and shortness of breath.   Cardiovascular: Positive for palpitations. Negative for chest pain and leg swelling.  Gastrointestinal: Negative for abdominal pain, diarrhea and vomiting.  Genitourinary: Negative for flank pain.  Musculoskeletal: Negative for back pain and neck pain.  Skin: Negative for rash.  Neurological: Negative for headaches.  Hematological: Does not bruise/bleed easily.  Psychiatric/Behavioral: Negative for confusion.    Physical Exam Updated Vital Signs BP 128/83 (BP Location: Right Arm)   Pulse 68   Temp 97.8 F (36.6 C) (Oral)   Resp 20   Ht 1.829 m (6')   SpO2 100%   BMI 29.40 kg/m   Physical Exam Vitals and nursing note reviewed.  Constitutional:  Appearance: Normal appearance. He is well-developed.  HENT:     Head: Atraumatic.     Nose: Nose normal.     Mouth/Throat:     Mouth: Mucous membranes are moist.     Pharynx: Oropharynx is clear.  Eyes:     General: No scleral icterus.    Conjunctiva/sclera: Conjunctivae normal.  Neck:     Trachea: No tracheal deviation.     Comments: Thyroid not grossly enlarged.  Cardiovascular:     Rate and Rhythm: Tachycardia present. Rhythm irregular.     Pulses: Normal pulses.     Heart sounds: Normal heart sounds. No murmur heard.  No friction rub. No gallop.    Pulmonary:     Effort: Pulmonary effort is normal. No accessory muscle usage or respiratory distress.     Breath sounds: Normal breath sounds.  Abdominal:     General: Bowel sounds are normal. There is no distension.     Palpations: Abdomen is soft.     Tenderness: There is no abdominal tenderness. There is no guarding.  Genitourinary:    Comments: No cva tenderness. Musculoskeletal:        General: No swelling or tenderness.     Cervical back: Normal range of motion and neck supple. No rigidity.     Right lower leg: No edema.     Left lower leg: No edema.  Skin:    General: Skin is warm and dry.     Findings: No rash.  Neurological:     Mental Status: He is alert.     Comments: Alert, speech clear.   Psychiatric:        Mood and Affect: Mood normal.     ED Results / Procedures / Treatments   Labs (all labs ordered are listed, but only abnormal results are displayed) Results for orders placed or performed during the hospital encounter of 12/13/19  CBC  Result Value Ref Range   WBC 4.7 4.0 - 10.5 K/uL   RBC 4.98 4.22 - 5.81 MIL/uL   Hemoglobin 16.3 13.0 - 17.0 g/dL   HCT 05.3 39 - 52 %   MCV 97.8 80.0 - 100.0 fL   MCH 32.7 26.0 - 34.0 pg   MCHC 33.5 30.0 - 36.0 g/dL   RDW 97.6 73.4 - 19.3 %   Platelets 243 150 - 400 K/uL   nRBC 0.0 0.0 - 0.2 %  Basic metabolic panel  Result Value Ref Range   Sodium 139 135 - 145 mmol/L   Potassium 4.3 3.5 - 5.1 mmol/L   Chloride 102 98 - 111 mmol/L   CO2 27 22 - 32 mmol/L   Glucose, Bld 103 (H) 70 - 99 mg/dL   BUN 13 8 - 23 mg/dL   Creatinine, Ser 7.90 0.61 - 1.24 mg/dL   Calcium 9.8 8.9 - 24.0 mg/dL   GFR calc non Af Amer >60 >60 mL/min   GFR calc Af Amer >60 >60 mL/min   Anion gap 10 5 - 15   EKG EKG Interpretation  Date/Time:  Monday December 13 2019 11:17:45 EDT Ventricular Rate:  60 PR Interval:    QRS Duration: 88 QT Interval:  376 QTC Calculation: 376 R Axis:   12 Text Interpretation: Atrial flutter with variable  A-V block Confirmed by Cathren Laine ([id number]) on 12/13/2019 1:16:03 PM   Radiology No results found.  Procedures .Sedation  Date/Time: 12/13/2019 3:05 PM Performed by: Cathren Laine, MD Authorized by: Cathren Laine, MD   Consent:  Consent obtained:  Verbal   Consent given by:  Patient   Alternatives discussed:  Analgesia without sedation Universal protocol:    Procedure explained and questions answered to patient or proxy's satisfaction: yes     Relevant documents present and verified: yes     Test results available and properly labeled: yes     Required blood products, implants, devices, and special equipment available: yes     Immediately prior to procedure a time out was called: yes     Patient identity confirmation method:  Verbally with patient, provided demographic data and arm band Indications:    Procedure performed:  Cardioversion   Procedure necessitating sedation performed by:  Physician performing sedation Pre-sedation assessment:    Time since last food or drink:  4 hours   ASA classification: class 1 - normal, healthy patient     Neck mobility: normal     Mouth opening:  3 or more finger widths   Thyromental distance:  4 finger widths   Mallampati score:  I - soft palate, uvula, fauces, pillars visible   Pre-sedation assessments completed and reviewed: airway patency, cardiovascular function, hydration status, mental status, nausea/vomiting, pain level, respiratory function and temperature     Pre-sedation assessment completed:  12/13/2019 2:30 PM Immediate pre-procedure details:    Reassessment: Patient reassessed immediately prior to procedure     Reviewed: vital signs, relevant labs/tests and NPO status     Verified: bag valve mask available, emergency equipment available, intubation equipment available, IV patency confirmed, oxygen available and suction available   Procedure details (see MAR for exact dosages):    Preoxygenation:  Nasal cannula   Sedation:   Propofol   Intended level of sedation: deep   Intra-procedure monitoring:  Blood pressure monitoring, cardiac monitor, continuous pulse oximetry, frequent LOC assessments, frequent vital sign checks and continuous capnometry   Intra-procedure events: none     Total Provider sedation time (minutes):  15 Post-procedure details:    Post-sedation assessment completed:  12/13/2019 3:06 PM   Attendance: Constant attendance by certified staff until patient recovered     Recovery: Patient returned to pre-procedure baseline     Post-sedation assessments completed and reviewed: airway patency, cardiovascular function, hydration status, mental status, nausea/vomiting, pain level, respiratory function and temperature     Patient is stable for discharge or admission: yes     Patient tolerance:  Tolerated well, no immediate complications .Cardioversion  Date/Time: 12/13/2019 3:07 PM Performed by: Lajean Saver, MD Authorized by: Lajean Saver, MD   Consent:    Consent obtained:  Verbal and written   Consent given by:  Patient   Alternatives discussed:  No treatment and rate-control medication Pre-procedure details:    Cardioversion basis:  Emergent   Rhythm:  Atrial flutter Patient sedated: Yes. Refer to sedation procedure documentation for details of sedation.  Attempt one:    Cardioversion mode:  Synchronous   Shock (Joules):  100   Shock outcome:  Conversion to normal sinus rhythm Post-procedure details:    Patient status:  Awake   Patient tolerance of procedure:  Tolerated well, no immediate complications   (including critical care time)  Medications Ordered in ED Medications  diltiazem (CARDIZEM) 1 mg/mL load via infusion 20 mg (has no administration in time range)    And  diltiazem (CARDIZEM) 100 mg in dextrose 5 % 100 mL (1 mg/mL) infusion (has no administration in time range)  sodium chloride 0.9 % bolus 1,000 mL (1,000 mLs Intravenous New Bag/Given 12/13/19  1230)    ED Course  I  have reviewed the triage vital signs and the nursing notes.  Pertinent labs & imaging results that were available during my care of the patient were reviewed by me and considered in my medical decision making (see chart for details).    MDM Rules/Calculators/A&P                          Iv ns. Continuous pulse ox and monitor. Ecg. Stat labs.  Reviewed nursing notes and prior charts for additional history.  Reviewed cardiology noted from today.  cardizem bolus and gtt.  Cardiology consulted - discussed APP note in Epic, pt also expressed desire to speak with cardiologist and clarify plan. Discussed pt - they will see in ED.  Labs reviewed/interpreted by me - wbc normal, chem normal.   Cardiology evaluated in ED - they request ED cardioversion, and d/c to home starting patient on eliquis 5 mg bid x 4 weeks, they will f/u in clinic.   Procedural sedation with propofol. Time out performed.   Cardioversion x 1 with 100 J, w conversion to sinus rhythm.  CRITICAL CARE RE: afib with rapid ventricular response, emergent/ED cardioversion.  Performed by: Suzi Roots Total critical care time: 35 minutes Critical care time was exclusive of separately billable procedures and treating other patients. Critical care was necessary to treat or prevent imminent or life-threatening deterioration. Critical care was time spent personally by me on the following activities: development of treatment plan with patient and/or surrogate as well as nursing, discussions with consultants, evaluation of patient's response to treatment, examination of patient, obtaining history from patient or surrogate, ordering and performing treatments and interventions, ordering and review of laboratory studies, ordering and review of radiographic studies, pulse oximetry and re-evaluation of patient's condition.   On recheck is fully awake and alert, no resp depression, pulse ox 99%,  Remains in nsr.    Final Clinical  Impression(s) / ED Diagnoses Final diagnoses:  None    Rx / DC Orders ED Discharge Orders    None           Cathren Laine, MD 12/13/19 1534

## 2019-12-13 NOTE — Sedation Documentation (Signed)
100J shock given to patient

## 2019-12-13 NOTE — Progress Notes (Addendum)
Primary Care Physician: Clovis Riley, L.August Saucer, MD Referring Physician: Dr. Remigio Eisenmenger is a 64 y.o. male with a h/o remote aflutter , has enjoyed sinus rhythm since his last cardioversion in  2015. He is not on any daily meds, no anticoagulation  with a CHA2DS2VASc score of 0. He went to a wedding on Saturday and drank more alcohol over a period of time than he usually does. He was notified by his watch around 3 pm that afternoon that he was out of rhythm. He does have 30 mg cardizem that he has taken frequently over the weekend now with controlled v response. His EKG shows atrial flutter. He does not want to wait to have a scheduled elective cardioversion, would like to go ahead and get back in rhythm. Not being on anticoagulation, his 48 hour window is up this afternoon at 3 pm. He is agreeable to go to the ER.  He is stable.  Today, he denies symptoms of palpitations, chest pain, shortness of breath, orthopnea, PND, lower extremity edema, dizziness, presyncope, syncope, or neurologic sequela. The patient is tolerating medications without difficulties and is otherwise without complaint today.   Past Medical History:  Diagnosis Date  . Atrial flutter (HCC)   . Dizzy 09/25/13  . Near syncope 09/25/13   Past Surgical History:  Procedure Laterality Date  . CARDIOVERSION N/A 09/27/2013   Procedure: CARDIOVERSION;  Surgeon: Lewayne Bunting, MD;  Location: [facility] ENDOSCOPY;  Service: Cardiovascular;  Laterality: N/A;  . RETINAL DETACHMENT SURGERY    . right knee arthroscopy    . TEE WITHOUT CARDIOVERSION N/A 09/27/2013   Procedure: TRANSESOPHAGEAL ECHOCARDIOGRAM (TEE);  Surgeon: Lewayne Bunting, MD;  Location: [facility] ENDOSCOPY;  Service: Cardiovascular;  Laterality: N/A;  . WRIST SURGERY      Current Outpatient Medications  Medication Sig Dispense Refill  . diltiazem (CARDIZEM) 30 MG tablet Take 1-2 tablets by mouth every 4 hours as needed for heart rate greater than 100 beats per minute 30  tablet 11   No current facility-administered medications for this encounter.    Allergies  Allergen Reactions  . Latex Itching    Skin turns real red    Social History   Socioeconomic History  . Marital status: Married    Spouse name: Not on file  . Number of children: Not on file  . Years of education: Not on file  . Highest education level: Not on file  Occupational History  . Not on file  Tobacco Use  . Smoking status: Never Smoker  . Smokeless tobacco: Never Used  Substance and Sexual Activity  . Alcohol use: Yes    Comment: occasionally  . Drug use: No  . Sexual activity: Not on file  Other Topics Concern  . Not on file  Social History Narrative   Pt lives in [place].  Works as a Copywriter, advertising for [employer].   Married. 2 children   Social Determinants of Health   Financial Resource Strain:   . Difficulty of Paying Living Expenses:   Food Insecurity:   . Worried About Programme researcher, broadcasting/film/video in the Last Year:   . Barista in the Last Year:   Transportation Needs:   . Freight forwarder (Medical):   [name] Lack of Transportation (Non-Medical):   Physical Activity:   . Days of Exercise per Week:   . Minutes of Exercise per Session:   Stress:   . Feeling of Stress :  Social Connections:   . Frequency of Communication with Friends and Family:   . Frequency of Social Gatherings with Friends and Family:   . Attends Religious Services:   . Active Member of Clubs or Organizations:   . Attends Archivist Meetings:   [name] Marital Status:   Intimate Partner Violence:   . Fear of Current or Ex-Partner:   . Emotionally Abused:   [name] Physically Abused:   . Sexually Abused:     Family History  Problem Relation Age of Onset  . Heart disease Mother   . Heart disease Father   . Healthy Sister   . Healthy Brother     ROS- All systems are reviewed and negative except as per the HPI above  Physical Exam: Vitals:   12/13/19 1032  BP: 110/80    Pulse: 68  Weight: 98.3 kg  Height: 5' 11.5" (1.816 m)   Wt Readings from Last 3 Encounters:  12/13/19 98.3 kg  11/10/19 97.1 kg  11/24/18 101.2 kg    Labs: Lab Results  Component Value Date   NA 136 (L) 09/26/2013   K 4.1 09/26/2013   CL 99 09/26/2013   CO2 26 09/26/2013   GLUCOSE 100 (H) 09/26/2013   BUN 13 09/26/2013   CREATININE 1.10 09/26/2013   CALCIUM 8.8 09/26/2013   MG 1.8 09/25/2013   Lab Results  Component Value Date   INR 0.98 09/25/2013   Lab Results  Component Value Date   CHOL 173 08/21/2015   HDL 54 08/21/2015   LDLCALC 105 08/21/2015   TRIG 69 08/21/2015     GEN- The patient is well appearing, alert and oriented x 3 today.   Head- normocephalic, atraumatic Eyes-  Sclera clear, conjunctiva pink Ears- hearing intact Oropharynx- clear Neck- supple, no JVP Lymph- no cervical lymphadenopathy Lungs- Clear to ausculation bilaterally, normal work of breathing Heart-Irregular rate and rhythm, no murmurs, rubs or gallops, PMI not laterally displaced GI- soft, NT, ND, + BS Extremities- no clubbing, cyanosis, or edema MS- no significant deformity or atrophy Skin- no rash or lesion Psych- euthymic mood, full affect Neuro- strength and sensation are intact  EKG- typical atrial flutter at 4: 1 av conduction   Assessment and Plan: 1. Typical A flutter  Pt has enjoyed a long period of time since his last episode of flutter (2015)  He feels  alcohol use at a wedding Saturday prompted this  He is using 30 mg cardizem to control v rates  Discussed options with pt, start anticoagulation with a CHA2DS2VASc score of 0 today, with an elective cardioversion after 21 days, or after 5 days with a TEE/DCCV  or proceed to ER to have cardioversion within his 48 hour window( up at 3pm today) with DOAC for 4 weeks after cardioversion. He opts to go to the ER  Charge  Nurse Magda Paganini notified To ER  I will get a f/u with Dr. Rayann Heman to discuss if he may be interested in  a flutter ablation since this was offered in 2015, but pt wanted an approach of watchful waiting.   He is aware that he will need to be on anticoagulation for 4 weeks following cardioversion.   Geroge Baseman Merida Alcantar, [facility] [address] [place], [place] [address] [phone]

## 2019-12-17 DIAGNOSIS — J3081 Allergic rhinitis due to animal (cat) (dog) hair and dander: Secondary | ICD-10-CM | POA: Diagnosis not present

## 2019-12-17 DIAGNOSIS — J301 Allergic rhinitis due to pollen: Secondary | ICD-10-CM | POA: Diagnosis not present

## 2019-12-17 DIAGNOSIS — J3089 Other allergic rhinitis: Secondary | ICD-10-CM | POA: Diagnosis not present

## 2019-12-24 ENCOUNTER — Other Ambulatory Visit: Payer: Self-pay

## 2019-12-24 ENCOUNTER — Encounter: Payer: Self-pay | Admitting: Internal Medicine

## 2019-12-24 ENCOUNTER — Telehealth (INDEPENDENT_AMBULATORY_CARE_PROVIDER_SITE_OTHER): Payer: BC Managed Care – PPO | Admitting: Internal Medicine

## 2019-12-24 VITALS — BP 120/80 | HR 56 | Ht 71.5 in | Wt 210.0 lb

## 2019-12-24 DIAGNOSIS — I483 Typical atrial flutter: Secondary | ICD-10-CM | POA: Diagnosis not present

## 2019-12-24 DIAGNOSIS — I10 Essential (primary) hypertension: Secondary | ICD-10-CM

## 2019-12-24 NOTE — Progress Notes (Signed)
Electrophysiology TeleHealth Note  Due to national recommendations of social distancing due to [place] 19, an audio telehealth visit is felt to be most appropriate for this patient at this time.  Verbal consent was obtained by me for the telehealth visit today.  The patient does not have capability for a virtual visit.  A phone visit is therefore required today.   Date:  12/24/2019   ID:  Walter, Deloria [name], MRN [id number]  Location: [facility] [place]  Provider location:  [place]  Evaluation Performed: Follow-up visit  PCP:  Alroy Dust, L.Marlou Sa, MD   Electrophysiologist:  Dr Rayann Heman  Chief Complaint:  palpitations  History of Present Illness:    Walter [name] is a 64 y.o. male who presents via telehealth conferencing today.  Since I saw him last, he did have typical atrial flutter for which he required cardioversion 12/13/2019. He feels better in sinus rhythm.  Today, he denies symptoms of palpitations, chest pain, shortness of breath,  lower extremity edema, dizziness, presyncope, or syncope.  The patient is otherwise without complaint today.   Past Medical History:  Diagnosis Date  . Atrial flutter ([place])   . Dizzy 09/25/13  . Near syncope 09/25/13    Past Surgical History:  Procedure Laterality Date  . CARDIOVERSION N/A 09/27/2013   Procedure: CARDIOVERSION;  Surgeon: Lelon Perla, MD;  Location: [facility] ENDOSCOPY;  Service: Cardiovascular;  Laterality: N/A;  . RETINAL DETACHMENT SURGERY    . right knee arthroscopy    . TEE WITHOUT CARDIOVERSION N/A 09/27/2013   Procedure: TRANSESOPHAGEAL ECHOCARDIOGRAM (TEE);  Surgeon: Lelon Perla, MD;  Location: [facility] ENDOSCOPY;  Service: Cardiovascular;  Laterality: N/A;  . WRIST SURGERY      Current Outpatient Medications  Medication Sig Dispense Refill  . acetaminophen (TYLENOL) 500 MG tablet Take 1,000 mg by mouth every 6 (six) hours as needed for mild pain or headache.    [name] apixaban (ELIQUIS) 5 MG TABS tablet Take 1 tablet  (5 mg total) by mouth 2 (two) times daily. 60 tablet 0  . Ascorbic Acid (VITAMIN C) 1000 MG tablet Take 1,000 mg by mouth daily.    . cholecalciferol (VITAMIN D3) 25 MCG (1000 UNIT) tablet Take 1,000 Units by mouth daily.    . Lactobacillus (PROBIOTIC ACIDOPHILUS PO) Take 1 capsule by mouth daily.     No current facility-administered medications for this visit.    Allergies:   Latex   Social History:  The patient  reports that he has never smoked. He has never used smokeless tobacco. He reports current alcohol use. He reports that he does not use drugs.   ROS:  Please see the history of present illness.   All other systems are personally reviewed and negative.    Exam:    Vital Signs:  BP 120/80   Pulse (!) 56   Ht 5' 11.5" (1.816 m)   Wt 210 lb (95.3 kg)   BMI 28.88 kg/m   Well sounding, alert and conversant   Labs/Other Tests and Data Reviewed:    Recent Labs: 12/13/2019: BUN 13; Creatinine, Ser 1.04; Hemoglobin 16.3; Platelets 243; Potassium 4.3; Sodium 139   Wt Readings from Last 3 Encounters:  12/24/19 210 lb (95.3 kg)  12/13/19 216 lb 12.8 oz (98.3 kg)  11/10/19 214 lb (97.1 kg)    Recent ekgs are reviewed and confirm typical atrial flutter  ASSESSMENT & PLAN:    1.  Recurrent symptomatic typical atrial flutter The patient has  had recurrence recently of his atrial flutter which led to an ER visit and cardioversion.  I have reviewed his ekg which shows very clear typical atrial flutter.  He has not previously had afib or other atrial arrhythmias.  Given the infrequent nature of his episodes, I would not advise AAD therapy. He has worked diligently on lifestyle modification. I think that he is a good candidate for ablation as a primary strategy. Risk, benefits, and alternatives to EP study and radiofrequency ablation were also discussed in detail today. These risks include but are not limited to stroke, bleeding, vascular damage, tamponade, perforation, damage to the  heart and other structures, AV block requiring pacemaker, worsening renal function, and death. The patient understands these risk and wishes to think about this further.  I will ask our nurse to contact him early next week to offer to schedule ablation. chads2vasc score is 1.  Continue eliquis for 4 weeks following his recent cardioversion 12/13/19 without interruption.  2. HTN Stable No change required today   Patient Risk:  after full review of this patients clinical status, I feel that they are at moderate risk at this time.  Today, I have spent 15 minutes with the patient with telehealth technology discussing arrhythmia management .    SignedHillis Range, MD  12/24/2019 1:37 PM     [facility] [address] [place] [address] [phone] (office) [phone] (fax)

## 2019-12-29 ENCOUNTER — Telehealth: Payer: Self-pay | Admitting: *Deleted

## 2019-12-29 DIAGNOSIS — I483 Typical atrial flutter: Secondary | ICD-10-CM

## 2019-12-29 DIAGNOSIS — I10 Essential (primary) hypertension: Secondary | ICD-10-CM

## 2019-12-29 NOTE — Telephone Encounter (Signed)
-----   Message from Wiliam Ke, RN sent at 12/29/2019  7:51 AM EDT -----  ----- Message ----- From: Hillis Range, MD Sent: 12/24/2019   1:25 PM EDT To: Wiliam Ke, RN  He wants to think about ablation for atrial flutter over the weekend.  I told him you would call him early next week to schedule.  Carto and anesthesia.

## 2019-12-31 DIAGNOSIS — J301 Allergic rhinitis due to pollen: Secondary | ICD-10-CM | POA: Diagnosis not present

## 2019-12-31 DIAGNOSIS — J3089 Other allergic rhinitis: Secondary | ICD-10-CM | POA: Diagnosis not present

## 2019-12-31 DIAGNOSIS — J3081 Allergic rhinitis due to animal (cat) (dog) hair and dander: Secondary | ICD-10-CM | POA: Diagnosis not present

## 2020-01-06 DIAGNOSIS — J3089 Other allergic rhinitis: Secondary | ICD-10-CM | POA: Diagnosis not present

## 2020-01-06 DIAGNOSIS — J301 Allergic rhinitis due to pollen: Secondary | ICD-10-CM | POA: Diagnosis not present

## 2020-01-06 DIAGNOSIS — J3081 Allergic rhinitis due to animal (cat) (dog) hair and dander: Secondary | ICD-10-CM | POA: Diagnosis not present

## 2020-01-07 ENCOUNTER — Encounter: Payer: Self-pay | Admitting: *Deleted

## 2020-01-07 NOTE — Telephone Encounter (Signed)
The patient has been scheduled for Sept 9, 2021 for an Aflutter ablation.  Covid test and labs are scheduled   Instruction letter has been sent via mychart   Work up completed

## 2020-01-17 ENCOUNTER — Telehealth: Payer: Self-pay | Admitting: Internal Medicine

## 2020-01-17 DIAGNOSIS — J301 Allergic rhinitis due to pollen: Secondary | ICD-10-CM | POA: Diagnosis not present

## 2020-01-17 DIAGNOSIS — J3089 Other allergic rhinitis: Secondary | ICD-10-CM | POA: Diagnosis not present

## 2020-01-17 DIAGNOSIS — J3081 Allergic rhinitis due to animal (cat) (dog) hair and dander: Secondary | ICD-10-CM | POA: Diagnosis not present

## 2020-01-17 MED ORDER — APIXABAN 5 MG PO TABS: 5.0000 mg | ORAL_TABLET | Freq: Two times a day (BID) | ORAL | 5 refills | Status: DC

## 2020-01-17 NOTE — Telephone Encounter (Signed)
*  STAT* If patient is at the pharmacy, call can be transferred to refill team.   1. Which medications need to be refilled? (please list name of each medication and dose if known)  apixaban (ELIQUIS) 5 MG TABS tablet  2. Which pharmacy/location (including street and city if local pharmacy) is medication to be sent to? [id number] - [place], [place] - [address]  3. Do they need a 30 day or 90 day supply? 30 with refills  Pt is out of medication.   Patient calling the office for samples of medication:   1.  What medication and dosage are you requesting samples for? apixaban (ELIQUIS) 5 MG TABS tablet  2.  Are you currently out of this medication? Yes     Pt took his last sample last night. Pt would like more samples. He needs to take this daily until his ablation

## 2020-01-17 NOTE — Telephone Encounter (Signed)
Prescription refill request for Eliquis received. Indication: Atrial Flutter Last office visit: 12/24/2019 Dr Johney Frame Scr: 1.04 12/13/2019 Age: 64 Weight: 95.3 kg

## 2020-01-21 DIAGNOSIS — Z23 Encounter for immunization: Secondary | ICD-10-CM | POA: Diagnosis not present

## 2020-01-26 DIAGNOSIS — J3089 Other allergic rhinitis: Secondary | ICD-10-CM | POA: Diagnosis not present

## 2020-01-26 DIAGNOSIS — J3081 Allergic rhinitis due to animal (cat) (dog) hair and dander: Secondary | ICD-10-CM | POA: Diagnosis not present

## 2020-01-26 DIAGNOSIS — J301 Allergic rhinitis due to pollen: Secondary | ICD-10-CM | POA: Diagnosis not present

## 2020-01-29 ENCOUNTER — Other Ambulatory Visit: Payer: Self-pay | Admitting: Adult Health

## 2020-01-29 NOTE — Progress Notes (Signed)
I connected by phone with Walter [name] on 01/29/2020 at 10:27 AM to discuss the potential use of a new treatment for mild to moderate COVID-19 viral infection in non-hospitalized patients.  This patient is a 64 y.o. male that meets the FDA criteria for Emergency Use Authorization of COVID monoclonal antibody casirivimab/imdevimab.  Has a (+) direct SARS-CoV-2 viral test result  Has mild or moderate COVID-19   Is NOT hospitalized due to COVID-19  Is within 10 days of symptom onset  Has at least one of the high risk factor(s) for progression to severe COVID-19 and/or hospitalization as defined in EUA.  Specific high risk criteria : Cardiovascular disease or hypertension   I have spoken and communicated the following to the patient or parent/caregiver regarding COVID monoclonal antibody treatment:  1. FDA has authorized the emergency use for the treatment of mild to moderate COVID-19 in adults and pediatric patients with positive results of direct SARS-CoV-2 viral testing who are 44 years of age and older weighing at least 40 kg, and who are at high risk for progressing to severe COVID-19 and/or hospitalization.  2. The significant known and potential risks and benefits of COVID monoclonal antibody, and the extent to which such potential risks and benefits are unknown.  3. Information on available alternative treatments and the risks and benefits of those alternatives, including clinical trials.  4. Patients treated with COVID monoclonal antibody should continue to self-isolate and use infection control measures (e.g., wear mask, isolate, social distance, avoid sharing personal items, clean and disinfect "high touch" surfaces, and frequent handwashing) according to CDC guidelines.   5. The patient or parent/caregiver has the option to accept or refuse COVID monoclonal antibody treatment.  After reviewing this information with the patient, The patient agreed to proceed with receiving  casirivimab\imdevimab infusion and will be provided a copy of the Fact sheet prior to receiving the infusion.  Set up for 01/31/20 at 0830 .   [name] 01/29/2020 10:27 AM

## 2020-01-30 MED ORDER — SODIUM CHLORIDE 0.9 % IV SOLN
Freq: Once | INTRAVENOUS | Status: AC
  Filled 2020-01-30: qty 600

## 2020-01-31 ENCOUNTER — Ambulatory Visit (HOSPITAL_COMMUNITY)
Admission: RE | Admit: 2020-01-31 | Discharge: 2020-01-31 | Disposition: A | Discharge status: Home / Self Care | Payer: BC Managed Care – PPO | Source: Ambulatory Visit | Attending: Pulmonary Disease | Admitting: Pulmonary Disease

## 2020-01-31 DIAGNOSIS — U071 COVID-19: Secondary | ICD-10-CM | POA: Diagnosis not present

## 2020-01-31 MED ORDER — ALBUTEROL SULFATE HFA 108 (90 BASE) MCG/ACT IN AERS: 2.0000 | INHALATION_SPRAY | Freq: Once | RESPIRATORY_TRACT | Status: DC | PRN

## 2020-01-31 MED ORDER — SODIUM CHLORIDE 0.9 % IV SOLN: INTRAVENOUS | Status: DC | PRN

## 2020-01-31 MED ORDER — FAMOTIDINE IN NACL 20-0.9 MG/50ML-% IV SOLN: 20.0000 mg | Freq: Once | INTRAVENOUS | Status: DC | PRN

## 2020-01-31 MED ORDER — METHYLPREDNISOLONE SODIUM SUCC 125 MG IJ SOLR: 125.0000 mg | Freq: Once | INTRAMUSCULAR | Status: DC | PRN

## 2020-01-31 MED ORDER — EPINEPHRINE 0.3 MG/0.3ML IJ SOAJ: 0.3000 mg | Freq: Once | INTRAMUSCULAR | Status: DC | PRN

## 2020-01-31 MED ORDER — DIPHENHYDRAMINE HCL 50 MG/ML IJ SOLN: 50.0000 mg | Freq: Once | INTRAMUSCULAR | Status: DC | PRN

## 2020-01-31 NOTE — Progress Notes (Signed)
  Diagnosis: COVID-19  Physician:  Dr Patrick Wright   Procedure: [facility] Med: casirivimab\imdevimab infusion - Provided patient with casirivimab\imdevimab fact sheet for patients, parents and caregivers prior to infusion.  Complications: No immediate complications noted.  Discharge: Discharged home   Walter [name] 01/31/2020  

## 2020-01-31 NOTE — Discharge Instructions (Signed)

## 2020-02-05 ENCOUNTER — Other Ambulatory Visit (HOSPITAL_COMMUNITY): Payer: BC Managed Care – PPO

## 2020-02-16 DIAGNOSIS — B353 Tinea pedis: Secondary | ICD-10-CM | POA: Diagnosis not present

## 2020-02-16 DIAGNOSIS — C4361 Malignant melanoma of right upper limb, including shoulder: Secondary | ICD-10-CM | POA: Diagnosis not present

## 2020-02-16 DIAGNOSIS — D225 Melanocytic nevi of trunk: Secondary | ICD-10-CM | POA: Diagnosis not present

## 2020-02-18 ENCOUNTER — Telehealth: Payer: Self-pay | Admitting: *Deleted

## 2020-02-18 MED ORDER — APIXABAN 5 MG PO TABS: 5.0000 mg | ORAL_TABLET | Freq: Two times a day (BID) | ORAL | 5 refills | Status: DC

## 2020-02-18 NOTE — Telephone Encounter (Signed)
refill 

## 2020-02-24 DIAGNOSIS — L905 Scar conditions and fibrosis of skin: Secondary | ICD-10-CM | POA: Diagnosis not present

## 2020-02-24 DIAGNOSIS — C4361 Malignant melanoma of right upper limb, including shoulder: Secondary | ICD-10-CM | POA: Diagnosis not present

## 2020-03-02 ENCOUNTER — Other Ambulatory Visit: Payer: BC Managed Care – PPO

## 2020-03-02 ENCOUNTER — Other Ambulatory Visit: Payer: Self-pay

## 2020-03-02 DIAGNOSIS — I10 Essential (primary) hypertension: Secondary | ICD-10-CM | POA: Diagnosis not present

## 2020-03-02 DIAGNOSIS — I483 Typical atrial flutter: Secondary | ICD-10-CM | POA: Diagnosis not present

## 2020-03-02 LAB — BASIC METABOLIC PANEL
BUN/Creatinine Ratio: 13 (ref 10–24)
BUN: 13 mg/dL (ref 8–27)
CO2: 23 mmol/L (ref 20–29)
Calcium: 9.4 mg/dL (ref 8.6–10.2)
Chloride: 103 mmol/L (ref 96–106)
Creatinine, Ser: 0.98 mg/dL (ref 0.76–1.27)
GFR calc Af Amer: 94 mL/min/{1.73_m2} (ref >59)
GFR calc non Af Amer: 81 mL/min/{1.73_m2} (ref >59)
Glucose: 115 mg/dL — ABNORMAL HIGH (ref 65–99)
Potassium: 4.1 mmol/L (ref 3.5–5.2)
Sodium: 141 mmol/L (ref 134–144)

## 2020-03-02 LAB — CBC WITH DIFFERENTIAL/PLATELET
Basophils Absolute: 0.1 10*3/uL (ref 0.0–0.2)
Basos: 1 %
EOS (ABSOLUTE): 0.2 10*3/uL (ref 0.0–0.4)
Eos: 4 %
Hematocrit: 43.2 % (ref 37.5–51.0)
Hemoglobin: 14.8 g/dL (ref 13.0–17.7)
Immature Grans (Abs): 0 10*3/uL (ref 0.0–0.1)
Immature Granulocytes: 0 %
Lymphocytes Absolute: 2.2 10*3/uL (ref 0.7–3.1)
Lymphs: 47 %
MCH: 32.7 pg (ref 26.6–33.0)
MCHC: 34.3 g/dL (ref 31.5–35.7)
MCV: 96 fL (ref 79–97)
Monocytes Absolute: 0.3 10*3/uL (ref 0.1–0.9)
Monocytes: 7 %
Neutrophils Absolute: 1.9 10*3/uL (ref 1.4–7.0)
Neutrophils: 41 %
Platelets: 209 10*3/uL (ref 150–450)
RBC: 4.52 x10E6/uL (ref 4.14–5.80)
RDW: 12.6 % (ref 11.6–15.4)
WBC: 4.6 10*3/uL (ref 3.4–10.8)

## 2020-03-07 ENCOUNTER — Inpatient Hospital Stay (HOSPITAL_COMMUNITY)
Admission: RE | Admit: 2020-03-07 | Discharge: 2020-03-07 | Disposition: A | Discharge status: Home / Self Care | Payer: BC Managed Care – PPO | Source: Ambulatory Visit

## 2020-03-07 NOTE — Progress Notes (Signed)
Patient tested positive for Covid 19 on 01/29/20.  Patient received covid infusion at [facility] on 01/31/20.  Due to positive home Covid test within 90 and Covid infustion, per Sherian Rein, no repeat test needed.

## 2020-03-08 NOTE — Progress Notes (Signed)
Instructed patient on the following items: Arrival time 1030 Nothing to eat or drink after midnight No meds AM of procedure Responsible person to drive you home and stay with you for 24 hrs  Have you missed any doses of anti-coagulant on Eliquis, will take both doses today and no meds in AM, may have missed a dose when sick with covid, will do EKG morning of procedure per Dr Johney Frame

## 2020-03-09 ENCOUNTER — Ambulatory Visit (HOSPITAL_COMMUNITY): Payer: BC Managed Care – PPO | Admitting: Anesthesiology

## 2020-03-09 ENCOUNTER — Encounter (HOSPITAL_COMMUNITY)
Admission: RE | Disposition: A | Discharge status: Home / Self Care | Payer: BC Managed Care – PPO | Source: Home / Self Care | Attending: Internal Medicine

## 2020-03-09 ENCOUNTER — Ambulatory Visit (HOSPITAL_COMMUNITY)
Admission: RE | Admit: 2020-03-09 | Discharge: 2020-03-09 | Disposition: A | Discharge status: Home / Self Care | Payer: BC Managed Care – PPO | Attending: Internal Medicine | Admitting: Internal Medicine

## 2020-03-09 ENCOUNTER — Other Ambulatory Visit: Payer: Self-pay

## 2020-03-09 ENCOUNTER — Encounter (HOSPITAL_COMMUNITY): Payer: Self-pay | Admitting: Internal Medicine

## 2020-03-09 DIAGNOSIS — M1712 Unilateral primary osteoarthritis, left knee: Secondary | ICD-10-CM | POA: Diagnosis not present

## 2020-03-09 DIAGNOSIS — I4892 Unspecified atrial flutter: Secondary | ICD-10-CM | POA: Diagnosis not present

## 2020-03-09 DIAGNOSIS — Z7901 Long term (current) use of anticoagulants: Secondary | ICD-10-CM | POA: Insufficient documentation

## 2020-03-09 DIAGNOSIS — U071 COVID-19: Secondary | ICD-10-CM | POA: Diagnosis not present

## 2020-03-09 HISTORY — PX: A-FLUTTER ABLATION: EP1230

## 2020-03-09 SURGERY — A-FLUTTER ABLATION
Anesthesia: Monitor Anesthesia Care

## 2020-03-09 MED ORDER — PROPOFOL 10 MG/ML IV BOLUS
INTRAVENOUS | Status: DC | PRN
  Administered 2020-03-09: 30 mg via INTRAVENOUS
  Administered 2020-03-09: 20 mg via INTRAVENOUS

## 2020-03-09 MED ORDER — BUPIVACAINE HCL (PF) 0.25 % IJ SOLN
INTRAMUSCULAR | Status: DC | PRN
  Administered 2020-03-09: 30 mL

## 2020-03-09 MED ORDER — PROPOFOL 500 MG/50ML IV EMUL
INTRAVENOUS | Status: DC | PRN
  Administered 2020-03-09: 100 ug/kg/min via INTRAVENOUS
  Administered 2020-03-09: 125 ug/kg/min via INTRAVENOUS

## 2020-03-09 MED ORDER — ONDANSETRON HCL 4 MG/2ML IJ SOLN: 4.0000 mg | Freq: Four times a day (QID) | INTRAMUSCULAR | Status: DC | PRN

## 2020-03-09 MED ORDER — ACETAMINOPHEN 325 MG PO TABS: 650.0000 mg | ORAL_TABLET | ORAL | Status: DC | PRN

## 2020-03-09 MED ORDER — BUPIVACAINE HCL (PF) 0.25 % IJ SOLN
INTRAMUSCULAR | Status: AC
  Filled 2020-03-09: qty 30

## 2020-03-09 MED ORDER — SODIUM CHLORIDE 0.9% FLUSH: 3.0000 mL | INTRAVENOUS | Status: DC | PRN

## 2020-03-09 MED ORDER — HYDROCODONE-ACETAMINOPHEN 5-325 MG PO TABS: 1.0000 | ORAL_TABLET | ORAL | Status: DC | PRN

## 2020-03-09 MED ORDER — LIDOCAINE 2% (20 MG/ML) 5 ML SYRINGE
INTRAMUSCULAR | Status: DC | PRN
Start: 2020-03-09 — End: 2020-03-09
  Administered 2020-03-09: 100 mg via INTRAVENOUS

## 2020-03-09 MED ORDER — PHENYLEPHRINE 40 MCG/ML (10ML) SYRINGE FOR IV PUSH (FOR BLOOD PRESSURE SUPPORT)
PREFILLED_SYRINGE | INTRAVENOUS | Status: DC | PRN
  Administered 2020-03-09 (×2): 120 ug via INTRAVENOUS
  Administered 2020-03-09: 80 ug via INTRAVENOUS

## 2020-03-09 MED ORDER — MIDAZOLAM HCL 5 MG/5ML IJ SOLN
INTRAMUSCULAR | Status: DC | PRN
  Administered 2020-03-09: 2 mg via INTRAVENOUS

## 2020-03-09 MED ORDER — SODIUM CHLORIDE 0.9 % IV SOLN: 250.0000 mL | INTRAVENOUS | Status: DC | PRN

## 2020-03-09 MED ORDER — SODIUM CHLORIDE 0.9% FLUSH: 3.0000 mL | Freq: Two times a day (BID) | INTRAVENOUS | Status: DC

## 2020-03-09 MED ORDER — FENTANYL CITRATE (PF) 100 MCG/2ML IJ SOLN
INTRAMUSCULAR | Status: DC | PRN
  Administered 2020-03-09: 25 ug via INTRAVENOUS

## 2020-03-09 MED ORDER — SODIUM CHLORIDE 0.9 % IV SOLN: INTRAVENOUS | Status: DC

## 2020-03-09 MED ORDER — HEPARIN SODIUM (PORCINE) 1000 UNIT/ML IJ SOLN
INTRAMUSCULAR | Status: AC
  Filled 2020-03-09: qty 1

## 2020-03-09 MED ORDER — ONDANSETRON HCL 4 MG/2ML IJ SOLN
INTRAMUSCULAR | Status: DC | PRN
  Administered 2020-03-09: 4 mg via INTRAVENOUS

## 2020-03-09 SURGICAL SUPPLY — 12 items
BLANKET WARM UNDERBOD FULL ACC (MISCELLANEOUS) ×2 IMPLANT
CATH EZ STEER NAV 8MM F-J CUR (ABLATOR) ×2 IMPLANT
CATH WEBSTER BI DIR CS D-F CRV (CATHETERS) ×2 IMPLANT
DEVICE CLOSURE PERCLS PRGLD 6F (VASCULAR PRODUCTS) ×2 IMPLANT
PACK EP LATEX FREE (CUSTOM PROCEDURE TRAY) ×2
PACK EP LF (CUSTOM PROCEDURE TRAY) ×1 IMPLANT
PAD PRO RADIOLUCENT 2001M-C (PAD) ×2 IMPLANT
PATCH CARTO3 (PAD) ×2 IMPLANT
PERCLOSE PROGLIDE 6F (VASCULAR PRODUCTS) ×4
SHEATH PINNACLE 7F 10CM (SHEATH) ×2 IMPLANT
SHEATH PINNACLE 8F 10CM (SHEATH) ×2 IMPLANT
SHIELD RADPAD SCOOP 12X17 (MISCELLANEOUS) ×2 IMPLANT

## 2020-03-09 NOTE — Transfer of Care (Signed)
Immediate Anesthesia Transfer of Care Note  Patient: Walter [name]  Procedure(s) Performed: A-FLUTTER ABLATION (N/A )  Patient Location: Cath Lab  Anesthesia Type:MAC  Level of Consciousness: oriented, drowsy and patient cooperative  Airway & Oxygen Therapy: Patient Spontanous Breathing and Patient connected to nasal cannula oxygen  Post-op Assessment: Report given to RN and Post -op Vital signs reviewed and stable  Post vital signs: Reviewed  Last Vitals:  Vitals Value Taken Time  BP 115/57 03/09/20 1230  Temp    Pulse 61 03/09/20 1233  Resp 16 03/09/20 1233  SpO2 95 % 03/09/20 1233  Vitals shown include unvalidated device data.  Last Pain:  Vitals:   03/09/20 0907  TempSrc:   PainSc: 0-No pain         Complications: No complications documented.

## 2020-03-09 NOTE — H&P (Signed)
Chief Complaint:  palpitations  History of Present Illness:    Walter [name] is a 64 y.o. male who presents today for atrial flutter ablation. He has done well since his last office visit. Today, he denies symptoms of palpitations, chest pain, shortness of breath,  lower extremity edema, dizziness, presyncope, or syncope.  The patient is otherwise without complaint today.       Past Medical History:  Diagnosis Date  . Atrial flutter (HCC)   . Dizzy 09/25/13  . Near syncope 09/25/13         Past Surgical History:  Procedure Laterality Date  . CARDIOVERSION N/A 09/27/2013   Procedure: CARDIOVERSION;  Surgeon: Lewayne Bunting, MD;  Location: [facility] ENDOSCOPY;  Service: Cardiovascular;  Laterality: N/A;  . RETINAL DETACHMENT SURGERY    . right knee arthroscopy    . TEE WITHOUT CARDIOVERSION N/A 09/27/2013   Procedure: TRANSESOPHAGEAL ECHOCARDIOGRAM (TEE);  Surgeon: Lewayne Bunting, MD;  Location: [facility] ENDOSCOPY;  Service: Cardiovascular;  Laterality: N/A;  . WRIST SURGERY            Current Outpatient Medications  Medication Sig Dispense Refill  . acetaminophen (TYLENOL) 500 MG tablet Take 1,000 mg by mouth every 6 (six) hours as needed for mild pain or headache.    [name] apixaban (ELIQUIS) 5 MG TABS tablet Take 1 tablet (5 mg total) by mouth 2 (two) times daily. 60 tablet 0  . Ascorbic Acid (VITAMIN C) 1000 MG tablet Take 1,000 mg by mouth daily.    . cholecalciferol (VITAMIN D3) 25 MCG (1000 UNIT) tablet Take 1,000 Units by mouth daily.    . Lactobacillus (PROBIOTIC ACIDOPHILUS PO) Take 1 capsule by mouth daily.     No current facility-administered medications for this visit.    Allergies:   Latex   Social History:  The patient  reports that he has never smoked. He has never used smokeless tobacco. He reports current alcohol use. He reports that he does not use drugs.   ROS:  Please see the history of present illness.   All other systems are personally  reviewed and negative.   Physical Exam: Vitals:   03/09/20 0841  BP: 132/85  Pulse: 65  Resp: 16  Temp: 97.6 F (36.4 C)  TempSrc: Oral  SpO2: 99%  Weight: 96.6 kg  Height: 6' (1.829 m)    GEN- The patient is well appearing, alert and oriented x 3 today.   Head- normocephalic, atraumatic Eyes-  Sclera clear, conjunctiva pink Ears- hearing intact Oropharynx- clear Neck- supple, Lungs-   normal work of breathing Heart- Regular rate and rhythm  GI- soft  Extremities- no clubbing, cyanosis, or edema  MS- no significant deformity or atrophy Skin- no rash or lesion Psych- euthymic mood, full affect Neuro- strength and sensation are intact   Labs/Other Tests and Data Reviewed:    Recent Labs: 12/13/2019: BUN 13; Creatinine, Ser 1.04; Hemoglobin 16.3; Platelets 243; Potassium 4.3; Sodium 139      Wt Readings from Last 3 Encounters:  12/24/19 210 lb (95.3 kg)  12/13/19 216 lb 12.8 oz (98.3 kg)  11/10/19 214 lb (97.1 kg)      ASSESSMENT & PLAN:    1.  Recurrent symptomatic typical atrial flutter The patient has had recurrence recently of his atrial flutter which led to an ER visit and cardioversion.  I have reviewed his ekg which shows very clear typical atrial flutter.  He has not previously had afib or other atrial arrhythmias.  Given  the infrequent nature of his episodes, I would not advise AAD therapy. He has worked diligently on lifestyle modification. I think that he is a good candidate for ablation as a primary strategy.   Risk, benefits, and alternatives to EP study and radiofrequency ablation were again discussed in detail today. These risks include but are not limited to stroke, bleeding, vascular damage, tamponade, perforation, damage to the heart and other structures, AV block requiring pacemaker, worsening renal function, and death. The patient understands these risk and wishes to proceed  Hillis Range MD, [facility] 03/09/2020 10:35 AM

## 2020-03-09 NOTE — Anesthesia Preprocedure Evaluation (Addendum)
Anesthesia Evaluation  Patient identified by MRN, date of birth, ID band Patient awake    Reviewed: Allergy & Precautions, NPO status , Patient's Chart, lab work & pertinent test results  History of Anesthesia Complications Negative for: history of anesthetic complications  Airway Mallampati: I  TM Distance: >3 FB Neck ROM: Full    Dental  (+) Dental Advisory Given, Teeth Intact   Pulmonary Recent URI , Resolved,  Covid + 12/2019    breath sounds clear to auscultation       Cardiovascular + dysrhythmias Atrial Fibrillation  Rhythm:Irregular     Neuro/Psych negative neurological ROS  negative psych ROS   GI/Hepatic negative GI ROS, Neg liver ROS,   Endo/Other  negative endocrine ROS  Renal/GU negative Renal ROS     Musculoskeletal  (+) Arthritis ,   Abdominal   Peds  Hematology eliquis   Lab Results      Component                Value               Date                      WBC                      4.6                 03/02/2020                HGB                      14.8                03/02/2020                HCT                      43.2                03/02/2020                MCV                      96                  03/02/2020                PLT                      209                 03/02/2020              Anesthesia Other Findings   Reproductive/Obstetrics                             Anesthesia Physical Anesthesia Plan  ASA: II  Anesthesia Plan: MAC   Post-op Pain Management:    Induction: Intravenous  PONV Risk Score and Plan: 1 and Treatment may vary due to age or medical condition and Propofol infusion  Airway Management Planned: Nasal Cannula  Additional Equipment:   Intra-op Plan:   Post-operative Plan:   Informed Consent: I have reviewed the patients History and Physical, chart, labs and discussed the procedure including the risks, benefits and  alternatives  for the proposed anesthesia with the patient or authorized representative who has indicated his/her understanding and acceptance.     Dental advisory given  Plan Discussed with: CRNA and Surgeon  Anesthesia Plan Comments:         Anesthesia Quick Evaluation

## 2020-03-09 NOTE — Discharge Instructions (Signed)
Cardiac Ablation, Care After  This sheet gives you information about how to care for yourself after your procedure. Your health care provider may also give you more specific instructions. If you have problems or questions, contact your health care provider. What can I expect after the procedure? After the procedure, it is common to have:  Bruising around your puncture site.  Tenderness around your puncture site.  Skipped heartbeats.  Tiredness (fatigue).  Follow these instructions at home: Puncture site care   Follow instructions from your health care provider about how to take care of your puncture site. Make sure you: ? If present, leave stitches (sutures), skin glue, or adhesive strips in place. These skin closures may need to stay in place for up to 2 weeks. If adhesive strip edges start to loosen and curl up, you may trim the loose edges. Do not remove adhesive strips completely unless your health care provider tells you to do that.  Check your puncture site every day for signs of infection. Check for: ? Redness, swelling, or pain. ? Fluid or blood. If your puncture site starts to bleed, lie down on your back, apply firm pressure to the area, and contact your health care provider. ? Warmth. ? Pus or a bad smell. Driving  Do not drive for at least 4 days after your procedure or however long your health care provider recommends. (Do not resume driving if you have previously been instructed not to drive for other health reasons.)  Do not drive or use heavy machinery while taking prescription pain medicine. Activity  Avoid activities that take a lot of effort for at least 7 days after your procedure.  Do not lift anything that is heavier than 5 lb (4.5 kg) for one week.   No sexual activity for 1 week.   Return to your normal activities as told by your health care provider. Ask your health care provider what activities are safe for you. General instructions  Take  over-the-counter and prescription medicines only as told by your health care provider.  Do not use any products that contain nicotine or tobacco, such as cigarettes and e-cigarettes. If you need help quitting, ask your health care provider.  You may shower after 24 hours, but Do not take baths, swim, or use a hot tub for 1 week.   Do not drink alcohol for 24 hours after your procedure.  Keep all follow-up visits as told by your health care provider. This is important. Contact a health care provider if:  You have redness, mild swelling, or pain around your puncture site.  You have fluid or blood coming from your puncture site that stops after applying firm pressure to the area.  Your puncture site feels warm to the touch.  You have pus or a bad smell coming from your puncture site.  You have a fever.  You have chest pain or discomfort that spreads to your neck, jaw, or arm.  You are sweating a lot.  You feel nauseous.  You have a fast or irregular heartbeat.  You have shortness of breath.  You are dizzy or light-headed and feel the need to lie down.  You have pain or numbness in the arm or leg closest to your puncture site. Get help right away if:  Your puncture site suddenly swells.  Your puncture site is bleeding and the bleeding does not stop after applying firm pressure to the area. These symptoms may represent a serious problem that is an emergency.   Do not wait to see if the symptoms will go away. Get medical help right away. Call your local emergency services (911 in the [place] Do not drive yourself to the hospital. Summary  After the procedure, it is normal to have bruising and tenderness at the puncture site in your groin, neck, or forearm.  Check your puncture site every day for signs of infection.  Get help right away if your puncture site is bleeding and the bleeding does not stop after applying firm pressure to the area. This is a medical emergency. This  information is not intended to replace advice given to you by your health care provider. Make sure you discuss any questions you have with your health care provider.    

## 2020-03-09 NOTE — Progress Notes (Signed)
Patient and wife was given discharge instructions. Both verbalized understanding. 

## 2020-03-09 NOTE — Anesthesia Procedure Notes (Signed)
Procedure Name: MAC Date/Time: 03/09/2020 11:04 AM Performed by: Jenne Campus, CRNA Pre-anesthesia Checklist: Patient identified, Emergency Drugs available, Suction available, Patient being monitored and Timeout performed

## 2020-03-10 ENCOUNTER — Encounter (HOSPITAL_COMMUNITY): Payer: Self-pay | Admitting: Internal Medicine

## 2020-03-14 NOTE — Anesthesia Postprocedure Evaluation (Signed)
Anesthesia Post Note  Patient: Walter [name]  Procedure(s) Performed: A-FLUTTER ABLATION (N/A )     Patient location during evaluation: Cath Lab Anesthesia Type: MAC Level of consciousness: awake and alert Pain management: pain level controlled Vital Signs Assessment: post-procedure vital signs reviewed and stable Respiratory status: spontaneous breathing, nonlabored ventilation, respiratory function stable and patient connected to nasal cannula oxygen Cardiovascular status: stable Postop Assessment: no apparent nausea or vomiting Anesthetic complications: no   No complications documented.  Last Vitals:  Vitals:   03/09/20 1430 03/09/20 1500  BP: 126/76 125/75  Pulse: (!) 59 60  Resp: 17 15  Temp:    SpO2: 97% 95%    Last Pain:  Vitals:   03/10/20 1048  TempSrc:   PainSc: 0-No pain                 Annaka Cleaver

## 2020-03-23 DIAGNOSIS — J3089 Other allergic rhinitis: Secondary | ICD-10-CM | POA: Diagnosis not present

## 2020-03-23 DIAGNOSIS — J301 Allergic rhinitis due to pollen: Secondary | ICD-10-CM | POA: Diagnosis not present

## 2020-03-27 DIAGNOSIS — J301 Allergic rhinitis due to pollen: Secondary | ICD-10-CM | POA: Diagnosis not present

## 2020-03-27 DIAGNOSIS — J3081 Allergic rhinitis due to animal (cat) (dog) hair and dander: Secondary | ICD-10-CM | POA: Diagnosis not present

## 2020-03-27 DIAGNOSIS — J3089 Other allergic rhinitis: Secondary | ICD-10-CM | POA: Diagnosis not present

## 2020-04-05 DIAGNOSIS — J3081 Allergic rhinitis due to animal (cat) (dog) hair and dander: Secondary | ICD-10-CM | POA: Diagnosis not present

## 2020-04-05 DIAGNOSIS — J3089 Other allergic rhinitis: Secondary | ICD-10-CM | POA: Diagnosis not present

## 2020-04-05 DIAGNOSIS — J301 Allergic rhinitis due to pollen: Secondary | ICD-10-CM | POA: Diagnosis not present

## 2020-04-07 ENCOUNTER — Ambulatory Visit: Payer: BC Managed Care – PPO | Admitting: Internal Medicine

## 2020-04-07 ENCOUNTER — Ambulatory Visit (INDEPENDENT_AMBULATORY_CARE_PROVIDER_SITE_OTHER): Payer: BC Managed Care – PPO | Admitting: Internal Medicine

## 2020-04-07 ENCOUNTER — Other Ambulatory Visit: Payer: Self-pay

## 2020-04-07 VITALS — BP 144/78 | HR 75 | Ht 72.0 in | Wt 223.0 lb

## 2020-04-07 DIAGNOSIS — I10 Essential (primary) hypertension: Secondary | ICD-10-CM | POA: Diagnosis not present

## 2020-04-07 DIAGNOSIS — I483 Typical atrial flutter: Secondary | ICD-10-CM | POA: Diagnosis not present

## 2020-04-07 NOTE — Progress Notes (Signed)
   PCP: Clovis Riley, L.August Saucer, MD   Primary EP: Dr Johney Frame  Walter [name] is a 64 y.o. male who presents today for routine electrophysiology followup.  Since his ablation, the patient reports doing very well.  Today, he denies symptoms of palpitations, chest pain, shortness of breath,  lower extremity edema, dizziness, presyncope, or syncope.  The patient is otherwise without complaint today.   Past Medical History:  Diagnosis Date  . Atrial flutter (HCC)   . Dizzy 09/25/13  . Near syncope 09/25/13   Past Surgical History:  Procedure Laterality Date  . A-FLUTTER ABLATION N/A 03/09/2020   Procedure: A-FLUTTER ABLATION;  Surgeon: Hillis Range, MD;  Location: [facility] INVASIVE CV LAB;  Service: Cardiovascular;  Laterality: N/A;  . CARDIOVERSION N/A 09/27/2013   Procedure: CARDIOVERSION;  Surgeon: Lewayne Bunting, MD;  Location: [facility] ENDOSCOPY;  Service: Cardiovascular;  Laterality: N/A;  . RETINAL DETACHMENT SURGERY    . right knee arthroscopy    . TEE WITHOUT CARDIOVERSION N/A 09/27/2013   Procedure: TRANSESOPHAGEAL ECHOCARDIOGRAM (TEE);  Surgeon: Lewayne Bunting, MD;  Location: [facility] ENDOSCOPY;  Service: Cardiovascular;  Laterality: N/A;  . WRIST SURGERY      ROS- all systems are reviewed and negatives except as per HPI above  Current Outpatient Medications  Medication Sig Dispense Refill  . acetaminophen (TYLENOL) 500 MG tablet Take 1,000 mg by mouth every 6 (six) hours as needed for mild pain or headache.    [name] apixaban (ELIQUIS) 5 MG TABS tablet Take 1 tablet (5 mg total) by mouth 2 (two) times daily. 60 tablet 5  . ascorbic acid (VITAMIN C) 500 MG tablet Take 500-1,000 mg by mouth 2 (two) times a week.    . cholecalciferol (VITAMIN D3) 25 MCG (1000 UNIT) tablet Take 1,000 Units by mouth 2 (two) times a week.     [name] EPINEPHrine 0.3 mg/0.3 mL IJ SOAJ injection Inject 0.3 mg into the muscle as needed for anaphylaxis.    . Probiotic Product (PROBIOTIC PO) Take 1 capsule by mouth 2 (two) times a week.     . vitamin E (VITAMIN E) 180 MG (400 UNITS) capsule Take 400 Units by mouth 2 (two) times a week.     No current facility-administered medications for this visit.    Physical Exam: Vitals:   04/07/20 1539  BP: (!) 144/78  Pulse: 75  SpO2: 91%  Weight: 223 lb (101.2 kg)  Height: 6' (1.829 m)    GEN- The patient is well appearing, alert and oriented x 3 today.   Head- normocephalic, atraumatic Eyes-  Sclera clear, conjunctiva pink Ears- hearing intact Oropharynx- clear Lungs-   normal work of breathing Heart- Regular rate and rhythm  GI- soft,  Extremities- no clubbing, cyanosis, or edema  Wt Readings from Last 3 Encounters:  04/07/20 223 lb (101.2 kg)  03/09/20 213 lb (96.6 kg)  12/24/19 210 lb (95.3 kg)    EKG tracing ordered today is personally reviewed and shows sinus rhythm,  Assessment and Plan:  1. Typical atrial flutter Resolved s/p ablation Stop eliquis  2. HTN Stable No change required today   Risks, benefits and potential toxicities for medications prescribed and/or refilled reviewed with patient today.   Return as needed  Hillis Range MD, [facility] 04/07/2020 3:51 PM

## 2020-04-07 NOTE — Patient Instructions (Addendum)
Medication Instructions:  Stop eliquis  *If you need a refill on your cardiac medications before your next appointment, please call your pharmacy*  Lab Work: None ordered.  If you have labs (blood work) drawn today and your tests are completely normal, you will receive your results only by: [name] MyChart Message (if you have MyChart) OR . A paper copy in the mail If you have any lab test that is abnormal or we need to change your treatment, we will call you to review the results.  Testing/Procedures: None ordered.  Follow-Up: At [facility], you and your health needs are our priority.  As part of our continuing mission to provide you with exceptional heart care, we have created designated Provider Care Teams.  These Care Teams include your primary Cardiologist (physician) and Advanced Practice Providers (APPs -  Physician Assistants and Nurse Practitioners) who all work together to provide you with the care you need, when you need it.  We recommend signing up for the patient portal called "MyChart".  Sign up information is provided on this After Visit Summary.  MyChart is used to connect with patients for Virtual Visits (Telemedicine).  Patients are able to view lab/test results, encounter notes, upcoming appointments, etc.  Non-urgent messages can be sent to your provider as well.   To learn more about what you can do with MyChart, go to [email or web address].    Your next appointment:   Your physician wants you to follow-up in: as needed   Other Instructions:

## 2020-04-20 DIAGNOSIS — J3089 Other allergic rhinitis: Secondary | ICD-10-CM | POA: Diagnosis not present

## 2020-04-20 DIAGNOSIS — J301 Allergic rhinitis due to pollen: Secondary | ICD-10-CM | POA: Diagnosis not present

## 2020-04-20 DIAGNOSIS — J3081 Allergic rhinitis due to animal (cat) (dog) hair and dander: Secondary | ICD-10-CM | POA: Diagnosis not present

## 2020-05-09 DIAGNOSIS — J301 Allergic rhinitis due to pollen: Secondary | ICD-10-CM | POA: Diagnosis not present

## 2020-05-09 DIAGNOSIS — J3081 Allergic rhinitis due to animal (cat) (dog) hair and dander: Secondary | ICD-10-CM | POA: Diagnosis not present

## 2020-05-09 DIAGNOSIS — J3089 Other allergic rhinitis: Secondary | ICD-10-CM | POA: Diagnosis not present

## 2020-05-17 DIAGNOSIS — J301 Allergic rhinitis due to pollen: Secondary | ICD-10-CM | POA: Diagnosis not present

## 2020-05-17 DIAGNOSIS — J3089 Other allergic rhinitis: Secondary | ICD-10-CM | POA: Diagnosis not present

## 2020-05-17 DIAGNOSIS — J3081 Allergic rhinitis due to animal (cat) (dog) hair and dander: Secondary | ICD-10-CM | POA: Diagnosis not present

## 2020-06-06 DIAGNOSIS — Z85820 Personal history of malignant melanoma of skin: Secondary | ICD-10-CM | POA: Diagnosis not present

## 2020-06-06 DIAGNOSIS — D2271 Melanocytic nevi of right lower limb, including hip: Secondary | ICD-10-CM | POA: Diagnosis not present

## 2020-06-06 DIAGNOSIS — D225 Melanocytic nevi of trunk: Secondary | ICD-10-CM | POA: Diagnosis not present

## 2020-07-03 ENCOUNTER — Encounter: Payer: Self-pay | Admitting: Internal Medicine

## 2020-07-03 ENCOUNTER — Ambulatory Visit: Payer: BC Managed Care – PPO | Admitting: Internal Medicine

## 2020-07-03 ENCOUNTER — Ambulatory Visit (INDEPENDENT_AMBULATORY_CARE_PROVIDER_SITE_OTHER): Payer: BC Managed Care – PPO | Admitting: Internal Medicine

## 2020-07-03 ENCOUNTER — Encounter: Payer: Self-pay | Admitting: *Deleted

## 2020-07-03 ENCOUNTER — Telehealth: Payer: Self-pay

## 2020-07-03 ENCOUNTER — Other Ambulatory Visit: Payer: Self-pay

## 2020-07-03 ENCOUNTER — Ambulatory Visit (INDEPENDENT_AMBULATORY_CARE_PROVIDER_SITE_OTHER): Payer: BC Managed Care – PPO

## 2020-07-03 VITALS — BP 124/76 | HR 67 | Ht 72.0 in | Wt 221.8 lb

## 2020-07-03 DIAGNOSIS — R5383 Other fatigue: Secondary | ICD-10-CM

## 2020-07-03 DIAGNOSIS — I509 Heart failure, unspecified: Secondary | ICD-10-CM

## 2020-07-03 DIAGNOSIS — R002 Palpitations: Secondary | ICD-10-CM

## 2020-07-03 DIAGNOSIS — R0683 Snoring: Secondary | ICD-10-CM

## 2020-07-03 DIAGNOSIS — I483 Typical atrial flutter: Secondary | ICD-10-CM | POA: Diagnosis not present

## 2020-07-03 MED ORDER — DILTIAZEM HCL 30 MG PO TABS: 30.0000 mg | ORAL_TABLET | Freq: Every day | ORAL | 3 refills | Status: DC | PRN

## 2020-07-03 NOTE — Progress Notes (Signed)
PCP: Clovis Riley, L.August Saucer, MD   Primary EP: Dr Johney Frame  Walter [name] is a 65 y.o. male who presents today for routine electrophysiology followup.  Since last being seen in our clinic, the patient reports doing very well.  He has over the past couple months noticed increased palpitations.  These feel different from atrial flutter.  He reports irregular beats as well as "skipped beats".  These may sustained for up to an hour at a time.  episodes are worsened with alcohol, sweets, and changes in altitude.  Today, he denies symptoms of chest pain, shortness of breath,  lower extremity edema, dizziness, presyncope, or syncope.  The patient is otherwise without complaint today.   Past Medical History:  Diagnosis Date  . Atrial flutter (HCC)   . Dizzy 09/25/13  . Near syncope 09/25/13   Past Surgical History:  Procedure Laterality Date  . A-FLUTTER ABLATION N/A 03/09/2020   Procedure: A-FLUTTER ABLATION;  Surgeon: Hillis Range, MD;  Location: [facility];  Service: Cardiovascular;  Laterality: N/A;  . CARDIOVERSION N/A 09/27/2013   Procedure: CARDIOVERSION;  Surgeon: Lewayne Bunting, MD;  Location: [facility] ENDOSCOPY;  Service: Cardiovascular;  Laterality: N/A;  . RETINAL DETACHMENT SURGERY    . right knee arthroscopy    . TEE WITHOUT CARDIOVERSION N/A 09/27/2013   Procedure: TRANSESOPHAGEAL ECHOCARDIOGRAM (TEE);  Surgeon: Lewayne Bunting, MD;  Location: [facility] ENDOSCOPY;  Service: Cardiovascular;  Laterality: N/A;  . WRIST SURGERY      ROS- all systems are reviewed and negatives except as per HPI above  Current Outpatient Medications  Medication Sig Dispense Refill  . acetaminophen (TYLENOL) 500 MG tablet Take 1,000 mg by mouth every 6 (six) hours as needed for mild pain or headache.    [name] ascorbic acid (VITAMIN C) 500 MG tablet Take 500-1,000 mg by mouth 2 (two) times a week.    . cholecalciferol (VITAMIN D3) 25 MCG (1000 UNIT) tablet Take 1,000 Units by mouth 2 (two) times a week.     [name]  EPINEPHrine 0.3 mg/0.3 mL IJ SOAJ injection Inject 0.3 mg into the muscle as needed for anaphylaxis.    . Probiotic Product (PROBIOTIC PO) Take 1 capsule by mouth 2 (two) times a week.    . vitamin E 180 MG (400 UNITS) capsule Take 400 Units by mouth 2 (two) times a week.     No current facility-administered medications for this visit.    Physical Exam: Vitals:   07/03/20 0950  BP: 124/76  Pulse: 67  SpO2: 98%  Weight: 221 lb 12.8 oz (100.6 kg)  Height: 6' (1.829 m)    GEN- The patient is well appearing, alert and oriented x 3 today.   Head- normocephalic, atraumatic Eyes-  Sclera clear, conjunctiva pink Ears- hearing intact Oropharynx- clear Lungs- Clear to ausculation bilaterally, normal work of breathing Heart- Regular rate and rhythm, no murmurs, rubs or gallops, PMI not laterally displaced GI- soft, NT, ND, + BS Extremities- no clubbing, cyanosis, or edema  Wt Readings from Last 3 Encounters:  07/03/20 221 lb 12.8 oz (100.6 kg)  04/07/20 223 lb (101.2 kg)  03/09/20 213 lb (96.6 kg)    EKG tracing ordered today is personally reviewed and shows sinus rhythm  Assessment and Plan:  1. Palpitations Unclear etiology Sounds different from atrial flutter We will place Zio monitor to further evaluate. He also plans to consider kardiamobile device. He has diltiazem to use prn No indication for anticoagulation currently chads2vasc score is 0.  2. Snoring Sleep study is ordered  3. Atrial flutter Appears resolved post ablation  Risks, benefits and potential toxicities for medications prescribed and/or refilled reviewed with patient today.   Return in 6 weeks  Hillis Range MD, [facility] 07/03/2020 10:00 AM

## 2020-07-03 NOTE — Progress Notes (Signed)
Patient ID: Walter [name], male   DOB: 1956/04/16, 65 y.o.   MRN: [id number] Patient enrolled for [employer] to ship a 14 day ZIO XT long term holter monitor to his home.
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
## 2020-07-03 NOTE — Patient Instructions (Addendum)
Medication Instructions:  Your physician recommends that you continue on your current medications as directed. Please refer to the Current Medication list given to you today.  *If you need a refill on your cardiac medications before your next appointment, please call your pharmacy*  Lab Work: None ordered.  If you have labs (blood work) drawn today and your tests are completely normal, you will receive your results only by: [name] MyChart Message (if you have MyChart) OR . A paper copy in the mail If you have any lab test that is abnormal or we need to change your treatment, we will call you to review the results.  Testing/Procedures: Your physician has recommended that you wear a heart monitor. Heart monitors are medical devices that record the heart's electrical activity. Doctors most often use these monitors to diagnose arrhythmias. Arrhythmias are problems with the speed or rhythm of the heartbeat. The monitor is a small, portable device. You can wear one while you do your normal daily activities. This is usually used to diagnose what is causing palpitations/syncope (passing out). Your physician has recommended that you have a sleep study. This test records several body functions during sleep, including: brain activity, eye movement, oxygen and carbon dioxide blood levels, heart rate and rhythm, breathing rate and rhythm, the flow of air through your mouth and nose, snoring, body muscle movements, and chest and belly movement.    Follow-Up: At [facility], you and your health needs are our priority.  As part of our continuing mission to provide you with exceptional heart care, we have created designated Provider Care Teams.  These Care Teams include your primary Cardiologist (physician) and Advanced Practice Providers (APPs -  Physician Assistants and Nurse Practitioners) who all work together to provide you with the care you need, when you need it.  We recommend signing up for the patient  portal called "MyChart".  Sign up information is provided on this After Visit Summary.  MyChart is used to connect with patients for Virtual Visits (Telemedicine).  Patients are able to view lab/test results, encounter notes, upcoming appointments, etc.  Non-urgent messages can be sent to your provider as well.   To learn more about what you can do with MyChart, go to [email or web address].    Your next appointment:   08/14/20 at 11 am with Dr. Johney Frame.     Other Instructions: ZIO XT- Long Term Monitor Instructions   Your physician has requested you wear your ZIO patch monitor_14____days.   This is a single patch monitor.  [employer] supplies one patch monitor per enrollment.  Additional stickers are not available.   Please do not apply patch if you will be having a Nuclear Stress Test, Echocardiogram, Cardiac CT, MRI, or Chest Xray during the time frame you would be wearing the monitor. The patch cannot be worn during these tests.  You cannot remove and re-apply the ZIO XT patch monitor.   Your ZIO patch monitor will be sent USPS Priority mail from [facility] directly to your home address. The monitor may also be mailed to a PO BOX if home delivery is not available.   It may take 3-5 days to receive your monitor after you have been enrolled.   Once you have received you monitor, please review enclosed instructions.  Your monitor has already been registered assigning a specific monitor serial # to you.   Applying the monitor   Shave hair from upper left chest.   Hold abrader disc by orange tab.  Rub  abrader in 40 strokes over left upper chest as indicated in your monitor instructions.   Clean area with 4 enclosed alcohol pads .  Use all pads to assure are is cleaned thoroughly.  Let dry.   Apply patch as indicated in monitor instructions.  Patch will be place under collarbone on left side of chest with arrow pointing upward.   Rub patch adhesive wings for 2 minutes.Remove white  label marked "1".  Remove white label marked "2".  Rub patch adhesive wings for 2 additional minutes.   While looking in a mirror, press and release button in center of patch.  A small green light will flash 3-4 times .  This will be your only indicator the monitor has been turned on.     Do not shower for the first 24 hours.  You may shower after the first 24 hours.   Press button if you feel a symptom. You will hear a small click.  Record Date, Time and Symptom in the Patient Log Book.   When you are ready to remove patch, follow instructions on last 2 pages of Patient Log Book.  Stick patch monitor onto last page of Patient Log Book.   Place Patient Log Book in [place] box.  Use locking tab on box and tape box closed securely.  The [employer] has [employer] on it.  Please place in mailbox as soon as possible.  Your physician should have your test results approximately 7 days after the monitor has been mailed back to [facility].   Call [facility] Customer Care at [phone] if you have questions regarding your ZIO XT patch monitor.  Call them immediately if you see an orange light blinking on your monitor.   If your monitor falls off in less than 4 days contact our Monitor department at [phone].  If your monitor becomes loose or falls off after 4 days call [employer] at [phone] for suggestions on securing your monitor.

## 2020-07-05 ENCOUNTER — Telehealth: Payer: Self-pay | Admitting: *Deleted

## 2020-07-05 NOTE — Telephone Encounter (Signed)
-----   Message from Sampson Goon, RN sent at 07/03/2020 10:59 AM EST ----- Regarding: home sleep test Home sleep study ordered for snoring.   Thank you

## 2020-07-12 ENCOUNTER — Ambulatory Visit: Payer: BC Managed Care – PPO | Admitting: Physician Assistant

## 2020-07-12 NOTE — Addendum Note (Signed)
Addended by: Sampson Goon on: 07/12/2020 01:37 PM   Modules accepted: Orders

## 2020-07-21 NOTE — Telephone Encounter (Signed)
M2500 HOME SLEEP TEST DENIED BY [facility]. NOT MEDICALLY NECESARY

## 2020-07-25 NOTE — Telephone Encounter (Signed)
He would have to do a peer to peer

## 2020-08-09 NOTE — Telephone Encounter (Signed)
[phone] Ext. [phone] please call for peer to peer

## 2020-08-10 DIAGNOSIS — I483 Typical atrial flutter: Secondary | ICD-10-CM | POA: Diagnosis not present

## 2020-08-10 DIAGNOSIS — R002 Palpitations: Secondary | ICD-10-CM | POA: Diagnosis not present

## 2020-08-10 DIAGNOSIS — I509 Heart failure, unspecified: Secondary | ICD-10-CM | POA: Diagnosis not present

## 2020-08-14 ENCOUNTER — Encounter: Payer: Self-pay | Admitting: Internal Medicine

## 2020-08-14 ENCOUNTER — Ambulatory Visit (INDEPENDENT_AMBULATORY_CARE_PROVIDER_SITE_OTHER): Payer: BC Managed Care – PPO | Admitting: Internal Medicine

## 2020-08-14 ENCOUNTER — Other Ambulatory Visit: Payer: Self-pay

## 2020-08-14 VITALS — BP 122/68 | HR 60 | Ht 72.0 in | Wt 223.8 lb

## 2020-08-14 DIAGNOSIS — R002 Palpitations: Secondary | ICD-10-CM

## 2020-08-14 DIAGNOSIS — I483 Typical atrial flutter: Secondary | ICD-10-CM | POA: Diagnosis not present

## 2020-08-14 DIAGNOSIS — R0683 Snoring: Secondary | ICD-10-CM | POA: Diagnosis not present

## 2020-08-14 MED ORDER — DILTIAZEM HCL ER COATED BEADS 240 MG PO CP24: 240.0000 mg | ORAL_CAPSULE | Freq: Every day | ORAL | 3 refills | Status: DC

## 2020-08-14 NOTE — Progress Notes (Signed)
PCP: Clovis Riley, L.August Saucer, MD   Primary EP: Dr Johney Frame  Walter [name] is a 66 y.o. male who presents today for routine electrophysiology followup.  Since last being seen in our clinic, the patient reports doing very well.  He continues to have palpitations.  His Apple watch recordings reveal primarily PACs as the cause.  Today, he denies symptoms of  chest pain, shortness of breath,  lower extremity edema, dizziness, presyncope, or syncope.  The patient is otherwise without complaint today.   Past Medical History:  Diagnosis Date  . Atrial flutter (HCC)   . Dizzy 09/25/13  . Near syncope 09/25/13   Past Surgical History:  Procedure Laterality Date  . A-FLUTTER ABLATION N/A 03/09/2020   Procedure: A-FLUTTER ABLATION;  Surgeon: Hillis Range, MD;  Location: [facility] INVASIVE CV LAB;  Service: Cardiovascular;  Laterality: N/A;  . CARDIOVERSION N/A 09/27/2013   Procedure: CARDIOVERSION;  Surgeon: Lewayne Bunting, MD;  Location: [facility] ENDOSCOPY;  Service: Cardiovascular;  Laterality: N/A;  . RETINAL DETACHMENT SURGERY    . right knee arthroscopy    . TEE WITHOUT CARDIOVERSION N/A 09/27/2013   Procedure: TRANSESOPHAGEAL ECHOCARDIOGRAM (TEE);  Surgeon: Lewayne Bunting, MD;  Location: [facility] ENDOSCOPY;  Service: Cardiovascular;  Laterality: N/A;  . WRIST SURGERY      ROS- all systems are reviewed and negatives except as per HPI above  Current Outpatient Medications  Medication Sig Dispense Refill  . acetaminophen (TYLENOL) 500 MG tablet Take 1,000 mg by mouth every 6 (six) hours as needed for mild pain or headache.    [name] ascorbic acid (VITAMIN C) 500 MG tablet Take 500-1,000 mg by mouth 2 (two) times a week.    . cholecalciferol (VITAMIN D3) 25 MCG (1000 UNIT) tablet Take 1,000 Units by mouth 2 (two) times a week.     . diltiazem (CARDIZEM) 30 MG tablet Take 1 tablet (30 mg total) by mouth daily as needed (1-2 tablets every 4-6 hrs for fast heart rate.). 30 tablet 3  . EPINEPHrine 0.3 mg/0.3 mL IJ SOAJ  injection Inject 0.3 mg into the muscle as needed for anaphylaxis.    . Probiotic Product (PROBIOTIC PO) Take 1 capsule by mouth 2 (two) times a week.    . vitamin E 180 MG (400 UNITS) capsule Take 400 Units by mouth 2 (two) times a week.     No current facility-administered medications for this visit.    Physical Exam: Vitals:   08/14/20 1129  BP: 122/68  Pulse: 60  SpO2: 97%  Weight: 223 lb 12.8 oz (101.5 kg)  Height: 6' (1.829 m)    GEN- The patient is well appearing, alert and oriented x 3 today.   Head- normocephalic, atraumatic Eyes-  Sclera clear, conjunctiva pink Ears- hearing intact Oropharynx- clear Lungs- Clear to ausculation bilaterally, normal work of breathing Heart- Regular rate and rhythm, no murmurs, rubs or gallops, PMI not laterally displaced GI- soft, NT, ND, + BS Extremities- no clubbing, cyanosis, or edema  Wt Readings from Last 3 Encounters:  08/14/20 223 lb 12.8 oz (101.5 kg)  07/03/20 221 lb 12.8 oz (100.6 kg)  04/07/20 223 lb (101.2 kg)    EKG tracing ordered today is personally reviewed and shows sinus rhythm  Zio monitor reviewed with him today  Assessment and Plan:  1. Atrial fibrillation Newly diagnosed by recent Zio monitor Burden was 1% and V rates were mostly controlled.  He did have short atrial tachycardia vs atypical atrial flutter also noted.  He also  has frequent PACs by Apple Watch which appear responsible for most of his symptoms. chads2vascs core is 0.  He does not require OAC therapy. He has diltiazem which he uses PRN. I will add diltiazem CD 120mg  daily today. We could consider flecainide vs ablation if episodes continues.  2. Atrial flutter S/p CTI ablation  3. Snoring Sleep study was denied by insurance.  He will discuss further with PCP on follow-up  Risks, benefits and potential toxicities for medications prescribed and/or refilled reviewed with patient today.   Return in 2 months  MD,  [facility] 08/14/2020 11:33 AM

## 2020-08-14 NOTE — Patient Instructions (Signed)
Medication Instructions:  Start Diltiazem 120 mg daily  Your physician recommends that you continue on your current medications as directed. Please refer to the Current Medication list given to you today.  Labwork: None ordered.  Testing/Procedures: None ordered.  Follow-Up: Your physician wants you to follow-up in: 10/02/20 at 3:30 pm with Dr. Johney Frame  Any Other Special Instructions Will Be Listed Below (If Applicable).  If you need a refill on your cardiac medications before your next appointment, please call your pharmacy.

## 2020-08-21 ENCOUNTER — Ambulatory Visit: Payer: BC Managed Care – PPO | Admitting: Internal Medicine

## 2020-08-21 MED ORDER — DILTIAZEM HCL ER COATED BEADS 120 MG PO CP24: 120.0000 mg | ORAL_CAPSULE | Freq: Every day | ORAL | 3 refills | Status: DC

## 2020-08-21 NOTE — Addendum Note (Signed)
Addended by: Sampson Goon on: 08/21/2020 08:21 AM   Modules accepted: Orders

## 2020-08-29 NOTE — Telephone Encounter (Signed)
The ordering provider has to make the call please.

## 2020-09-01 NOTE — Telephone Encounter (Signed)
Dr. Johney Frame deferred discussion to PCP at last visit.

## 2020-09-14 NOTE — Telephone Encounter (Signed)
Reached out to the PCP and was told they have not seen the patient in a year and they have not referred him for a sleep study.

## 2020-09-15 NOTE — Telephone Encounter (Signed)
3. Snoring Sleep study was denied by insurance.  He will discuss further with PCP on follow-up

## 2020-09-25 ENCOUNTER — Emergency Department (HOSPITAL_COMMUNITY): Payer: Medicare Other

## 2020-09-25 ENCOUNTER — Encounter (HOSPITAL_COMMUNITY): Payer: Self-pay | Admitting: Emergency Medicine

## 2020-09-25 ENCOUNTER — Inpatient Hospital Stay (HOSPITAL_COMMUNITY)
Admission: EM | Admit: 2020-09-25 | Discharge: 2020-10-28 | DRG: 003 | Disposition: A | Discharge status: Rehabilitation Facility | Payer: Medicare Other | Attending: Internal Medicine | Admitting: Internal Medicine

## 2020-09-25 ENCOUNTER — Other Ambulatory Visit: Payer: Self-pay

## 2020-09-25 ENCOUNTER — Inpatient Hospital Stay (HOSPITAL_COMMUNITY): Payer: Medicare Other

## 2020-09-25 DIAGNOSIS — R9401 Abnormal electroencephalogram [EEG]: Secondary | ICD-10-CM | POA: Diagnosis not present

## 2020-09-25 DIAGNOSIS — R1310 Dysphagia, unspecified: Secondary | ICD-10-CM | POA: Diagnosis present

## 2020-09-25 DIAGNOSIS — Z978 Presence of other specified devices: Secondary | ICD-10-CM

## 2020-09-25 DIAGNOSIS — I82461 Acute embolism and thrombosis of right calf muscular vein: Secondary | ICD-10-CM | POA: Diagnosis present

## 2020-09-25 DIAGNOSIS — G910 Communicating hydrocephalus: Secondary | ICD-10-CM | POA: Diagnosis present

## 2020-09-25 DIAGNOSIS — I82413 Acute embolism and thrombosis of femoral vein, bilateral: Secondary | ICD-10-CM | POA: Diagnosis present

## 2020-09-25 DIAGNOSIS — I69128 Other speech and language deficits following nontraumatic intracerebral hemorrhage: Secondary | ICD-10-CM | POA: Diagnosis not present

## 2020-09-25 DIAGNOSIS — Z888 Allergy status to other drugs, medicaments and biological substances status: Secondary | ICD-10-CM

## 2020-09-25 DIAGNOSIS — J9602 Acute respiratory failure with hypercapnia: Secondary | ICD-10-CM | POA: Diagnosis not present

## 2020-09-25 DIAGNOSIS — Z8616 Personal history of COVID-19: Secondary | ICD-10-CM

## 2020-09-25 DIAGNOSIS — D72829 Elevated white blood cell count, unspecified: Secondary | ICD-10-CM | POA: Diagnosis not present

## 2020-09-25 DIAGNOSIS — I480 Paroxysmal atrial fibrillation: Secondary | ICD-10-CM | POA: Diagnosis present

## 2020-09-25 DIAGNOSIS — Z91040 Latex allergy status: Secondary | ICD-10-CM

## 2020-09-25 DIAGNOSIS — B9689 Other specified bacterial agents as the cause of diseases classified elsewhere: Secondary | ICD-10-CM | POA: Diagnosis not present

## 2020-09-25 DIAGNOSIS — R509 Fever, unspecified: Secondary | ICD-10-CM

## 2020-09-25 DIAGNOSIS — I69391 Dysphagia following cerebral infarction: Secondary | ICD-10-CM | POA: Diagnosis not present

## 2020-09-25 DIAGNOSIS — I82401 Acute embolism and thrombosis of unspecified deep veins of right lower extremity: Secondary | ICD-10-CM | POA: Diagnosis not present

## 2020-09-25 DIAGNOSIS — R471 Dysarthria and anarthria: Secondary | ICD-10-CM | POA: Diagnosis present

## 2020-09-25 DIAGNOSIS — I4891 Unspecified atrial fibrillation: Secondary | ICD-10-CM | POA: Diagnosis not present

## 2020-09-25 DIAGNOSIS — I615 Nontraumatic intracerebral hemorrhage, intraventricular: Principal | ICD-10-CM | POA: Diagnosis present

## 2020-09-25 DIAGNOSIS — D696 Thrombocytopenia, unspecified: Secondary | ICD-10-CM | POA: Diagnosis present

## 2020-09-25 DIAGNOSIS — E876 Hypokalemia: Secondary | ICD-10-CM | POA: Diagnosis not present

## 2020-09-25 DIAGNOSIS — I69191 Dysphagia following nontraumatic intracerebral hemorrhage: Secondary | ICD-10-CM | POA: Diagnosis not present

## 2020-09-25 DIAGNOSIS — I824Z3 Acute embolism and thrombosis of unspecified deep veins of distal lower extremity, bilateral: Secondary | ICD-10-CM | POA: Diagnosis not present

## 2020-09-25 DIAGNOSIS — G935 Compression of brain: Secondary | ICD-10-CM | POA: Diagnosis present

## 2020-09-25 DIAGNOSIS — R569 Unspecified convulsions: Secondary | ICD-10-CM | POA: Diagnosis not present

## 2020-09-25 DIAGNOSIS — I619 Nontraumatic intracerebral hemorrhage, unspecified: Secondary | ICD-10-CM | POA: Diagnosis present

## 2020-09-25 DIAGNOSIS — R7989 Other specified abnormal findings of blood chemistry: Secondary | ICD-10-CM | POA: Diagnosis not present

## 2020-09-25 DIAGNOSIS — Z79899 Other long term (current) drug therapy: Secondary | ICD-10-CM | POA: Diagnosis not present

## 2020-09-25 DIAGNOSIS — Z20822 Contact with and (suspected) exposure to covid-19: Secondary | ICD-10-CM | POA: Diagnosis present

## 2020-09-25 DIAGNOSIS — D539 Nutritional anemia, unspecified: Secondary | ICD-10-CM | POA: Diagnosis present

## 2020-09-25 DIAGNOSIS — I2699 Other pulmonary embolism without acute cor pulmonale: Secondary | ICD-10-CM | POA: Diagnosis present

## 2020-09-25 DIAGNOSIS — Y95 Nosocomial condition: Secondary | ICD-10-CM | POA: Diagnosis not present

## 2020-09-25 DIAGNOSIS — I610 Nontraumatic intracerebral hemorrhage in hemisphere, subcortical: Secondary | ICD-10-CM | POA: Diagnosis not present

## 2020-09-25 DIAGNOSIS — I8290 Acute embolism and thrombosis of unspecified vein: Secondary | ICD-10-CM | POA: Diagnosis not present

## 2020-09-25 DIAGNOSIS — Z982 Presence of cerebrospinal fluid drainage device: Secondary | ICD-10-CM | POA: Diagnosis not present

## 2020-09-25 DIAGNOSIS — R739 Hyperglycemia, unspecified: Secondary | ICD-10-CM | POA: Diagnosis present

## 2020-09-25 DIAGNOSIS — I4892 Unspecified atrial flutter: Secondary | ICD-10-CM | POA: Diagnosis present

## 2020-09-25 DIAGNOSIS — L89811 Pressure ulcer of head, stage 1: Secondary | ICD-10-CM | POA: Diagnosis present

## 2020-09-25 DIAGNOSIS — G936 Cerebral edema: Secondary | ICD-10-CM | POA: Diagnosis present

## 2020-09-25 DIAGNOSIS — E669 Obesity, unspecified: Secondary | ICD-10-CM | POA: Diagnosis present

## 2020-09-25 DIAGNOSIS — Z01818 Encounter for other preprocedural examination: Secondary | ICD-10-CM | POA: Diagnosis not present

## 2020-09-25 DIAGNOSIS — J9600 Acute respiratory failure, unspecified whether with hypoxia or hypercapnia: Secondary | ICD-10-CM

## 2020-09-25 DIAGNOSIS — I69119 Unspecified symptoms and signs involving cognitive functions following nontraumatic intracerebral hemorrhage: Secondary | ICD-10-CM | POA: Diagnosis not present

## 2020-09-25 DIAGNOSIS — I613 Nontraumatic intracerebral hemorrhage in brain stem: Secondary | ICD-10-CM | POA: Diagnosis not present

## 2020-09-25 DIAGNOSIS — Z9911 Dependence on respirator [ventilator] status: Secondary | ICD-10-CM | POA: Diagnosis not present

## 2020-09-25 DIAGNOSIS — J156 Pneumonia due to other aerobic Gram-negative bacteria: Secondary | ICD-10-CM | POA: Diagnosis not present

## 2020-09-25 DIAGNOSIS — N179 Acute kidney failure, unspecified: Secondary | ICD-10-CM | POA: Diagnosis not present

## 2020-09-25 DIAGNOSIS — Z6830 Body mass index (BMI) 30.0-30.9, adult: Secondary | ICD-10-CM

## 2020-09-25 DIAGNOSIS — G9349 Other encephalopathy: Secondary | ICD-10-CM | POA: Diagnosis present

## 2020-09-25 DIAGNOSIS — I609 Nontraumatic subarachnoid hemorrhage, unspecified: Secondary | ICD-10-CM | POA: Diagnosis present

## 2020-09-25 DIAGNOSIS — I611 Nontraumatic intracerebral hemorrhage in hemisphere, cortical: Secondary | ICD-10-CM | POA: Diagnosis not present

## 2020-09-25 DIAGNOSIS — R7303 Prediabetes: Secondary | ICD-10-CM | POA: Diagnosis present

## 2020-09-25 DIAGNOSIS — G479 Sleep disorder, unspecified: Secondary | ICD-10-CM | POA: Diagnosis not present

## 2020-09-25 DIAGNOSIS — I82421 Acute embolism and thrombosis of right iliac vein: Secondary | ICD-10-CM | POA: Diagnosis present

## 2020-09-25 DIAGNOSIS — I629 Nontraumatic intracranial hemorrhage, unspecified: Secondary | ICD-10-CM

## 2020-09-25 DIAGNOSIS — G8191 Hemiplegia, unspecified affecting right dominant side: Secondary | ICD-10-CM | POA: Diagnosis present

## 2020-09-25 DIAGNOSIS — J9621 Acute and chronic respiratory failure with hypoxia: Secondary | ICD-10-CM | POA: Diagnosis not present

## 2020-09-25 DIAGNOSIS — L8990 Pressure ulcer of unspecified site, unspecified stage: Secondary | ICD-10-CM | POA: Insufficient documentation

## 2020-09-25 DIAGNOSIS — L89891 Pressure ulcer of other site, stage 1: Secondary | ICD-10-CM | POA: Diagnosis not present

## 2020-09-25 DIAGNOSIS — I69122 Dysarthria following nontraumatic intracerebral hemorrhage: Secondary | ICD-10-CM | POA: Diagnosis not present

## 2020-09-25 DIAGNOSIS — J988 Other specified respiratory disorders: Secondary | ICD-10-CM | POA: Diagnosis not present

## 2020-09-25 DIAGNOSIS — I82431 Acute embolism and thrombosis of right popliteal vein: Secondary | ICD-10-CM | POA: Diagnosis not present

## 2020-09-25 DIAGNOSIS — R339 Retention of urine, unspecified: Secondary | ICD-10-CM | POA: Diagnosis not present

## 2020-09-25 DIAGNOSIS — I82220 Acute embolism and thrombosis of inferior vena cava: Secondary | ICD-10-CM | POA: Diagnosis not present

## 2020-09-25 DIAGNOSIS — E785 Hyperlipidemia, unspecified: Secondary | ICD-10-CM | POA: Diagnosis present

## 2020-09-25 DIAGNOSIS — I639 Cerebral infarction, unspecified: Secondary | ICD-10-CM

## 2020-09-25 DIAGNOSIS — T783XXA Angioneurotic edema, initial encounter: Secondary | ICD-10-CM | POA: Diagnosis not present

## 2020-09-25 DIAGNOSIS — G911 Obstructive hydrocephalus: Secondary | ICD-10-CM | POA: Diagnosis present

## 2020-09-25 DIAGNOSIS — G9340 Encephalopathy, unspecified: Secondary | ICD-10-CM

## 2020-09-25 DIAGNOSIS — Z8249 Family history of ischemic heart disease and other diseases of the circulatory system: Secondary | ICD-10-CM

## 2020-09-25 DIAGNOSIS — I82511 Chronic embolism and thrombosis of right femoral vein: Secondary | ICD-10-CM | POA: Diagnosis not present

## 2020-09-25 DIAGNOSIS — D62 Acute posthemorrhagic anemia: Secondary | ICD-10-CM | POA: Diagnosis not present

## 2020-09-25 DIAGNOSIS — Z930 Tracheostomy status: Secondary | ICD-10-CM | POA: Diagnosis not present

## 2020-09-25 DIAGNOSIS — J9610 Chronic respiratory failure, unspecified whether with hypoxia or hypercapnia: Secondary | ICD-10-CM | POA: Diagnosis not present

## 2020-09-25 DIAGNOSIS — I82441 Acute embolism and thrombosis of right tibial vein: Secondary | ICD-10-CM | POA: Diagnosis not present

## 2020-09-25 DIAGNOSIS — I69151 Hemiplegia and hemiparesis following nontraumatic intracerebral hemorrhage affecting right dominant side: Secondary | ICD-10-CM | POA: Diagnosis not present

## 2020-09-25 DIAGNOSIS — R748 Abnormal levels of other serum enzymes: Secondary | ICD-10-CM | POA: Diagnosis present

## 2020-09-25 DIAGNOSIS — R4182 Altered mental status, unspecified: Secondary | ICD-10-CM | POA: Diagnosis not present

## 2020-09-25 DIAGNOSIS — Z452 Encounter for adjustment and management of vascular access device: Secondary | ICD-10-CM | POA: Diagnosis not present

## 2020-09-25 DIAGNOSIS — Z794 Long term (current) use of insulin: Secondary | ICD-10-CM | POA: Diagnosis not present

## 2020-09-25 DIAGNOSIS — Z90710 Acquired absence of both cervix and uterus: Secondary | ICD-10-CM

## 2020-09-25 DIAGNOSIS — J9601 Acute respiratory failure with hypoxia: Secondary | ICD-10-CM | POA: Diagnosis present

## 2020-09-25 DIAGNOSIS — I119 Hypertensive heart disease without heart failure: Secondary | ICD-10-CM | POA: Diagnosis present

## 2020-09-25 DIAGNOSIS — I824Y3 Acute embolism and thrombosis of unspecified deep veins of proximal lower extremity, bilateral: Secondary | ICD-10-CM | POA: Diagnosis not present

## 2020-09-25 DIAGNOSIS — I6389 Other cerebral infarction: Secondary | ICD-10-CM

## 2020-09-25 DIAGNOSIS — Z430 Encounter for attention to tracheostomy: Secondary | ICD-10-CM | POA: Diagnosis not present

## 2020-09-25 DIAGNOSIS — R600 Localized edema: Secondary | ICD-10-CM | POA: Diagnosis not present

## 2020-09-25 DIAGNOSIS — E440 Moderate protein-calorie malnutrition: Secondary | ICD-10-CM | POA: Diagnosis not present

## 2020-09-25 DIAGNOSIS — E870 Hyperosmolality and hypernatremia: Secondary | ICD-10-CM

## 2020-09-25 DIAGNOSIS — E860 Dehydration: Secondary | ICD-10-CM | POA: Diagnosis present

## 2020-09-25 HISTORY — DX: Cerebral infarction, unspecified: I63.9

## 2020-09-25 LAB — GLUCOSE, CAPILLARY
Glucose-Capillary: 110 mg/dL — ABNORMAL HIGH (ref 70–99)
Glucose-Capillary: 116 mg/dL — ABNORMAL HIGH (ref 70–99)
Glucose-Capillary: 116 mg/dL — ABNORMAL HIGH (ref 70–99)
Glucose-Capillary: 123 mg/dL — ABNORMAL HIGH (ref 70–99)
Glucose-Capillary: 125 mg/dL — ABNORMAL HIGH (ref 70–99)

## 2020-09-25 LAB — I-STAT ARTERIAL BLOOD GAS, ED
Acid-base deficit: 1 mmol/L (ref 0.0–2.0)
Bicarbonate: 25.8 mmol/L (ref 20.0–28.0)
Calcium, Ion: 1.25 mmol/L (ref 1.15–1.40)
HCT: 44 % (ref 39.0–52.0)
Hemoglobin: 15 g/dL (ref 13.0–17.0)
O2 Saturation: 99 %
Potassium: 4.2 mmol/L (ref 3.5–5.1)
Sodium: 139 mmol/L (ref 135–145)
TCO2: 27 mmol/L (ref 22–32)
pCO2 arterial: 50.6 mmHg — ABNORMAL HIGH (ref 32.0–48.0)
pH, Arterial: 7.316 — ABNORMAL LOW (ref 7.350–7.450)
pO2, Arterial: 142 mmHg — ABNORMAL HIGH (ref 83.0–108.0)

## 2020-09-25 LAB — CBC
HCT: 45.7 % (ref 39.0–52.0)
Hemoglobin: 15.3 g/dL (ref 13.0–17.0)
MCH: 31.3 pg (ref 26.0–34.0)
MCHC: 33.5 g/dL (ref 30.0–36.0)
MCV: 93.5 fL (ref 80.0–100.0)
Platelets: 214 10*3/uL (ref 150–400)
RBC: 4.89 MIL/uL (ref 4.22–5.81)
RDW: 12.7 % (ref 11.5–15.5)
WBC: 6.7 10*3/uL (ref 4.0–10.5)
nRBC: 0 % (ref 0.0–0.2)

## 2020-09-25 LAB — DIFFERENTIAL
Abs Immature Granulocytes: 0.01 10*3/uL (ref 0.00–0.07)
Basophils Absolute: 0.1 10*3/uL (ref 0.0–0.1)
Basophils Relative: 1 %
Eosinophils Absolute: 0.2 10*3/uL (ref 0.0–0.5)
Eosinophils Relative: 3 %
Immature Granulocytes: 0 %
Lymphocytes Relative: 54 %
Lymphs Abs: 3.7 10*3/uL (ref 0.7–4.0)
Monocytes Absolute: 0.6 10*3/uL (ref 0.1–1.0)
Monocytes Relative: 10 %
Neutro Abs: 2.2 10*3/uL (ref 1.7–7.7)
Neutrophils Relative %: 32 %

## 2020-09-25 LAB — ECHOCARDIOGRAM COMPLETE
AR max vel: 3.56 cm2
AV Area VTI: 3.37 cm2
AV Area mean vel: 3.62 cm2
AV Mean grad: 4 mmHg
AV Peak grad: 8 mmHg
Ao pk vel: 1.41 m/s
Area-P 1/2: 1.85 cm2
Calc EF: 34.1 %
Height: 74 in
S' Lateral: 2.5 cm
Single Plane A2C EF: 35.8 %
Single Plane A4C EF: 34.3 %
Weight: 3562.63 oz

## 2020-09-25 LAB — RAPID URINE DRUG SCREEN, HOSP PERFORMED
Amphetamines: NOT DETECTED
Barbiturates: NOT DETECTED
Benzodiazepines: NOT DETECTED
Cocaine: NOT DETECTED
Opiates: NOT DETECTED
Tetrahydrocannabinol: NOT DETECTED

## 2020-09-25 LAB — I-STAT CHEM 8, ED
BUN: 18 mg/dL (ref 8–23)
Calcium, Ion: 1.23 mmol/L (ref 1.15–1.40)
Chloride: 102 mmol/L (ref 98–111)
Creatinine, Ser: 0.9 mg/dL (ref 0.61–1.24)
Glucose, Bld: 170 mg/dL — ABNORMAL HIGH (ref 70–99)
HCT: 46 % (ref 39.0–52.0)
Hemoglobin: 15.6 g/dL (ref 13.0–17.0)
Potassium: 3.6 mmol/L (ref 3.5–5.1)
Sodium: 140 mmol/L (ref 135–145)
TCO2: 25 mmol/L (ref 22–32)

## 2020-09-25 LAB — COMPREHENSIVE METABOLIC PANEL
ALT: 14 U/L (ref 0–44)
AST: 15 U/L (ref 15–41)
Albumin: 3.7 g/dL (ref 3.5–5.0)
Alkaline Phosphatase: 51 U/L (ref 38–126)
Anion gap: 8 (ref 5–15)
BUN: 15 mg/dL (ref 8–23)
CO2: 25 mmol/L (ref 22–32)
Calcium: 8.8 mg/dL — ABNORMAL LOW (ref 8.9–10.3)
Chloride: 104 mmol/L (ref 98–111)
Creatinine, Ser: 1 mg/dL (ref 0.61–1.24)
GFR, Estimated: >60 mL/min (ref >60)
Glucose, Bld: 169 mg/dL — ABNORMAL HIGH (ref 70–99)
Potassium: 3.6 mmol/L (ref 3.5–5.1)
Sodium: 137 mmol/L (ref 135–145)
Total Bilirubin: 0.9 mg/dL (ref 0.3–1.2)
Total Protein: 6.1 g/dL — ABNORMAL LOW (ref 6.5–8.1)

## 2020-09-25 LAB — RESP PANEL BY RT-PCR (FLU A&B, COVID) ARPGX2
Influenza A by PCR: NEGATIVE
Influenza B by PCR: NEGATIVE
SARS Coronavirus 2 by RT PCR: NEGATIVE

## 2020-09-25 LAB — LIPID PANEL
Cholesterol: 155 mg/dL (ref 0–200)
HDL: 44 mg/dL (ref >40)
LDL Cholesterol: 88 mg/dL (ref 0–99)
Total CHOL/HDL Ratio: 3.5 RATIO
Triglycerides: 117 mg/dL (ref <150)
VLDL: 23 mg/dL (ref 0–40)

## 2020-09-25 LAB — URINALYSIS, ROUTINE W REFLEX MICROSCOPIC
Bilirubin Urine: NEGATIVE
Glucose, UA: 50 mg/dL — AB
Hgb urine dipstick: NEGATIVE
Ketones, ur: NEGATIVE mg/dL
Leukocytes,Ua: NEGATIVE
Nitrite: NEGATIVE
Protein, ur: NEGATIVE mg/dL
Specific Gravity, Urine: 1.024 (ref 1.005–1.030)
pH: 7 (ref 5.0–8.0)

## 2020-09-25 LAB — CBG MONITORING, ED: Glucose-Capillary: 166 mg/dL — ABNORMAL HIGH (ref 70–99)

## 2020-09-25 LAB — MRSA PCR SCREENING: MRSA by PCR: NEGATIVE

## 2020-09-25 LAB — ETHANOL: Alcohol, Ethyl (B): <10 mg/dL (ref <10)

## 2020-09-25 LAB — HEMOGLOBIN A1C
Hgb A1c MFr Bld: 5.7 % — ABNORMAL HIGH (ref 4.8–5.6)
Mean Plasma Glucose: 116.89 mg/dL

## 2020-09-25 LAB — PROTIME-INR
INR: 1 (ref 0.8–1.2)
Prothrombin Time: 12.6 seconds (ref 11.4–15.2)

## 2020-09-25 LAB — SODIUM
Sodium: 137 mmol/L (ref 135–145)
Sodium: 142 mmol/L (ref 135–145)
Sodium: 142 mmol/L (ref 135–145)

## 2020-09-25 LAB — APTT: aPTT: 27 seconds (ref 24–36)

## 2020-09-25 MED ORDER — CHLORHEXIDINE GLUCONATE 0.12% ORAL RINSE (MEDLINE KIT)
15.0000 mL | Freq: Two times a day (BID) | OROMUCOSAL | Status: DC
  Administered 2020-09-25 – 2020-09-27 (×6): 15 mL via OROMUCOSAL

## 2020-09-25 MED ORDER — CHLORHEXIDINE GLUCONATE CLOTH 2 % EX PADS
6.0000 | MEDICATED_PAD | Freq: Every day | CUTANEOUS | Status: DC
  Administered 2020-09-25 – 2020-10-18 (×22): 6 via TOPICAL

## 2020-09-25 MED ORDER — IOHEXOL 350 MG/ML SOLN
75.0000 mL | Freq: Once | INTRAVENOUS | Status: AC | PRN
  Administered 2020-09-25: 75 mL via INTRAVENOUS

## 2020-09-25 MED ORDER — SODIUM CHLORIDE 3 % IV BOLUS
250.0000 mL | Freq: Once | INTRAVENOUS | Status: AC
  Administered 2020-09-25: 250 mL via INTRAVENOUS

## 2020-09-25 MED ORDER — ACETAMINOPHEN 325 MG PO TABS
650.0000 mg | ORAL_TABLET | ORAL | Status: DC | PRN
  Filled 2020-09-25: qty 2

## 2020-09-25 MED ORDER — PANTOPRAZOLE SODIUM 40 MG PO PACK
40.0000 mg | PACK | Freq: Every day | ORAL | Status: DC
  Administered 2020-09-25 – 2020-10-18 (×24): 40 mg
  Filled 2020-09-25 (×24): qty 20

## 2020-09-25 MED ORDER — PROPOFOL 1000 MG/100ML IV EMUL
INTRAVENOUS | Status: AC
  Administered 2020-09-25: 10 ug/kg/min via INTRAVENOUS
  Filled 2020-09-25: qty 100

## 2020-09-25 MED ORDER — ACETAMINOPHEN 160 MG/5ML PO SOLN
650.0000 mg | ORAL | Status: DC | PRN
  Administered 2020-09-26 – 2020-10-19 (×39): 650 mg
  Filled 2020-09-25 (×39): qty 20.3

## 2020-09-25 MED ORDER — SODIUM CHLORIDE 3 % IV SOLN
INTRAVENOUS | Status: DC
  Filled 2020-09-25 (×10): qty 500

## 2020-09-25 MED ORDER — SODIUM CHLORIDE 0.9 % IV BOLUS
500.0000 mL | Freq: Once | INTRAVENOUS | Status: AC
  Administered 2020-09-25: 500 mL via INTRAVENOUS

## 2020-09-25 MED ORDER — SENNOSIDES-DOCUSATE SODIUM 8.6-50 MG PO TABS
1.0000 | ORAL_TABLET | Freq: Two times a day (BID) | ORAL | Status: DC
  Administered 2020-09-25 – 2020-10-03 (×14): 1
  Filled 2020-09-25 (×14): qty 1

## 2020-09-25 MED ORDER — SENNOSIDES-DOCUSATE SODIUM 8.6-50 MG PO TABS: 1.0000 | ORAL_TABLET | Freq: Two times a day (BID) | ORAL | Status: DC

## 2020-09-25 MED ORDER — PROPOFOL 1000 MG/100ML IV EMUL
5.0000 ug/kg/min | INTRAVENOUS | Status: DC
  Administered 2020-09-25: 20 ug/kg/min via INTRAVENOUS
  Administered 2020-09-25: 10 ug/kg/min via INTRAVENOUS
  Administered 2020-09-25 – 2020-09-26 (×2): 20 ug/kg/min via INTRAVENOUS
  Administered 2020-09-26 – 2020-09-27 (×4): 30 ug/kg/min via INTRAVENOUS
  Filled 2020-09-25 (×9): qty 100

## 2020-09-25 MED ORDER — STROKE: EARLY STAGES OF RECOVERY BOOK: Freq: Once | Status: AC

## 2020-09-25 MED ORDER — CLEVIDIPINE BUTYRATE 0.5 MG/ML IV EMUL
0.0000 mg/h | INTRAVENOUS | Status: DC
  Administered 2020-09-26 – 2020-09-27 (×2): 1 mg/h via INTRAVENOUS
  Filled 2020-09-25 (×3): qty 50

## 2020-09-25 MED ORDER — ROCURONIUM BROMIDE 10 MG/ML (PF) SYRINGE
PREFILLED_SYRINGE | INTRAVENOUS | Status: AC
  Administered 2020-09-25: 80 mg
  Filled 2020-09-25: qty 10

## 2020-09-25 MED ORDER — ETOMIDATE 2 MG/ML IV SOLN
INTRAVENOUS | Status: AC
  Administered 2020-09-25: 20 mg
  Filled 2020-09-25: qty 10

## 2020-09-25 MED ORDER — LIDOCAINE HCL URETHRAL/MUCOSAL 2 % EX GEL
1.0000 "application " | CUTANEOUS | Status: AC
  Administered 2020-09-25: 1 via URETHRAL
  Filled 2020-09-25: qty 11

## 2020-09-25 MED ORDER — INSULIN ASPART 100 UNIT/ML ~~LOC~~ SOLN
0.0000 [IU] | Freq: Three times a day (TID) | SUBCUTANEOUS | Status: DC
  Administered 2020-09-25: 1 [IU] via SUBCUTANEOUS
  Administered 2020-09-27 (×2): 2 [IU] via SUBCUTANEOUS
  Administered 2020-09-28: 1 [IU] via SUBCUTANEOUS
  Administered 2020-09-28 (×2): 2 [IU] via SUBCUTANEOUS
  Administered 2020-09-29 (×3): 1 [IU] via SUBCUTANEOUS
  Administered 2020-09-30 (×2): 2 [IU] via SUBCUTANEOUS
  Administered 2020-09-30: 1 [IU] via SUBCUTANEOUS
  Administered 2020-10-01: 2 [IU] via SUBCUTANEOUS
  Administered 2020-10-01: 3 [IU] via SUBCUTANEOUS
  Administered 2020-10-01: 2 [IU] via SUBCUTANEOUS

## 2020-09-25 MED ORDER — LABETALOL HCL 5 MG/ML IV SOLN
20.0000 mg | Freq: Once | INTRAVENOUS | Status: AC
  Administered 2020-09-25: 20 mg via INTRAVENOUS
  Filled 2020-09-25: qty 4

## 2020-09-25 MED ORDER — FENTANYL CITRATE (PF) 100 MCG/2ML IJ SOLN
25.0000 ug | INTRAMUSCULAR | Status: DC | PRN
  Administered 2020-09-25 – 2020-09-27 (×5): 25 ug via INTRAVENOUS
  Filled 2020-09-25 (×6): qty 2

## 2020-09-25 MED ORDER — ACETAMINOPHEN 650 MG RE SUPP: 650.0000 mg | RECTAL | Status: DC | PRN

## 2020-09-25 MED ORDER — ORAL CARE MOUTH RINSE
15.0000 mL | OROMUCOSAL | Status: DC
  Administered 2020-09-25 – 2020-09-27 (×25): 15 mL via OROMUCOSAL

## 2020-09-25 MED ORDER — PANTOPRAZOLE SODIUM 40 MG IV SOLR: 40.0000 mg | Freq: Every day | INTRAVENOUS | Status: DC

## 2020-09-25 MED ORDER — GADOBUTROL 1 MMOL/ML IV SOLN
10.0000 mL | Freq: Once | INTRAVENOUS | Status: AC | PRN
  Administered 2020-09-25: 10 mL via INTRAVENOUS

## 2020-09-25 NOTE — Progress Notes (Signed)
PT Cancellation Note  Patient Details Name: Walter [name] MRN: [id number] DOB: 01-09-1956   Cancelled Treatment:    Reason Eval/Treat Not Completed: Patient not medically ready Intubated, sedated, and bedrest order  Lillia Pauls, PT, DPT Acute Rehabilitation Services Pager [phone] Office [phone]    [name] 09/25/2020, 7:55 AM

## 2020-09-25 NOTE — Consult Note (Addendum)
NAME:  Walter [name], MRN:  [id number], DOB:  1956/06/08, LOS: 0 ADMISSION DATE:  09/25/2020, CONSULTATION DATE:  09/25/20 REFERRING MD:  Wilford Corner,  CHIEF COMPLAINT:  Headache, r side weakness, slurred speech  History of Present Illness:  Walter [name] is a 65 yo man with hx of afib, woke at 2am today with head or ear discomfort.  Speech slurred, drowsy and sleepy, found by EMS to have R sided weakness.   Worsening mental status, GCS 5, so was emergently intubated in ED for airway protection.   Intubated at about 30  Wife present at the bedside.    Repeat head CT planned.   Pertinent  Medical History  Aflutter/afib (not on ac)  S/p Ablation 03/2020, hx of Cardioversion 2015 Hx retinal detachment Hx CV  No hx of strokes or HTN Hx of dizziness/near syncope  Meds: vitamin C, Vit D, vit Walter, probiotic, Diltiazem 120 daily, epinephrine prn for anaphylaxis   Significant Hospital Events: Including procedures, antibiotic start and stop dates in addition to other pertinent events   .   Interim History / Subjective:    Objective   Blood pressure 114/69, pulse 66, temperature (!) 97.4 F (36.3 C), resp. rate 19, height 6\' 2"  (1.88 m), weight 107.7 kg, SpO2 100 %.    Vent Mode: PRVC FiO2 (%):  [100 %] 100 % Set Rate:  [16 bmp] 16 bmp Vt Set:  [650 mL] 650 mL PEEP:  [6 cmH20] 6 cmH20 Plateau Pressure:  [16 cmH20] 16 cmH20  No intake or output data in the 24 hours ending 09/25/20 0442 Filed Weights   09/25/20 0334  Weight: 107.7 kg    Examination: General: intubated, sedated on propofol  HENT: NCAT, Pupils errl Lungs: CTAB Cardiovascular: RRR no mgr Abdomen: NT, ND, NBS Extremities: no edema, no erythema Neuro: sedated, not withdrawing to pain.    Labs/imaging that I havepersonally reviewed  (right click and "Reselect all SmartList Selections" daily)  CT-scan of the brain Intraparenchymal hemorrhage seems to be in the dorsal left midbrain and pons with extension into the  cerebral aqueduct and fourth ventricle.  Local mass-effect on the brainstem.  No frank hydrocephalus at this time.  CT angiogram head and neck IMPRESSION: 1. No vascular malformation, anomaly or aneurysm. 2. No emergent large vessel occlusion or high-grade stenosis of the intracranial arteries. 3. Unchanged size of dorsal left pontine hemorrhage with intraventricular extension.  CXR: et tube in place, no acute abnormality   Echo 2018: - Left ventricle: The cavity size was normal. Wall thickness was  normal. Systolic function was normal. The estimated ejection  fraction was in the range of 55% to 60%. Wall motion was normal;  there were no regional wall motion abnormalities. Doppler  parameters are consistent with abnormal left ventricular  relaxation (grade 1 diastolic dysfunction). The Walter/Walter&' ratio is <8,  suggesting normal LV filling pressure.  - Mitral valve: Mildly thickened leaflets . There was trivial  regurgitation.  - Right atrium: The atrium was mildly dilated.  - Tricuspid valve: There was trivial regurgitation.  - Pulmonary arteries: PA peak pressure: 22 mm Hg (S).  - Inferior vena cava: The vessel was normal in size. The  respirophasic diameter changes were in the normal range (>= 50%),  consistent with normal central venous pressure.   Resolved Hospital Problem list     Assessment & Plan:  Hemorrhagic stroke:   dorsal left midbrain and pons  Intubated for airway protection.  Neurology admitting and managing.  Neurosurgery  consulted (per neuro note).  Repeat head CT planned for 6 hrs.  BP goal <140 systolic Cont mech vent at current settings, already adjusted for most recent ABG.    Atrial fib: not on AC.   Rate well controlled.   Best practice (right click and "Reselect all SmartList Selections" daily)  Diet:  NPO Pain/Anxiety/Delirium protocol (if indicated): Yes (RASS goal -1) VAP protocol (if indicated): Not indicated DVT  prophylaxis: Contraindicated GI prophylaxis: PPI Glucose control:  SSI Yes Central venous access:  N/A Arterial line:  N/A Foley:  N/A Mobility:  bed rest  PT consulted: N/A Last date of multidisciplinary goals of care discussion []  Code Status:  full code Disposition: ICU  Labs   CBC: Recent Labs  Lab 09/25/20 0309 09/25/20 0328  WBC 6.7  --   NEUTROABS 2.2  --   HGB 15.3 15.6  HCT 45.7 46.0  MCV 93.5  --   PLT 214  --     Basic Metabolic Panel: Recent Labs  Lab 09/25/20 0309 09/25/20 0328  NA 137 140  K 3.6 3.6  CL 104 102  CO2 25  --   GLUCOSE 169* 170*  BUN 15 18  CREATININE 1.00 0.90  CALCIUM 8.8*  --    GFR: Estimated Creatinine Clearance: 106.9 mL/min (by C-G formula based on SCr of 0.9 mg/dL). Recent Labs  Lab 09/25/20 0309  WBC 6.7    Liver Function Tests: Recent Labs  Lab 09/25/20 0309  AST 15  ALT 14  ALKPHOS 51  BILITOT 0.9  PROT 6.1*  ALBUMIN 3.7   No results for input(s): LIPASE, AMYLASE in the last 168 hours. No results for input(s): AMMONIA in the last 168 hours.  ABG    Component Value Date/Time   TCO2 25 09/25/2020 0328     Coagulation Profile: Recent Labs  Lab 09/25/20 0309  INR 1.0    Cardiac Enzymes: No results for input(s): CKTOTAL, CKMB, CKMBINDEX, TROPONINI in the last 168 hours.  HbA1C: Hgb A1c MFr Bld  Date/Time Value Ref Range Status  09/25/2020 03:09 AM 5.7 (H) 4.8 - 5.6 % Final    Comment:    (NOTE) Pre diabetes:          5.7%-6.4%  Diabetes:              >6.4%  Glycemic control for   <7.0% adults with diabetes   09/25/2013 06:10 PM 5.7 (H) <5.7 % Final    Comment:    (NOTE)                                                                       According to the ADA Clinical Practice Recommendations for 2011, when HbA1c is used as a screening test:  >=6.5%   Diagnostic of Diabetes Mellitus           (if abnormal result is confirmed) 5.7-6.4%   Increased risk of developing Diabetes  [place] References:Diagnosis and Classification of Diabetes Mellitus,Diabetes Care,2011,34(Suppl 1):S62-S69 and Standards of Medical Care in         Diabetes - 2011,Diabetes Care,2011,34 (Suppl 1):S11-S61.    CBG: Recent Labs  Lab 09/25/20 0300  GLUCAP 166*    Review of Systems:   Unable to assess  Past Medical History:  He,  has a past medical history of Atrial flutter (HCC), Dizzy (09/25/13), and Near syncope (09/25/13).   Surgical History:   Past Surgical History:  Procedure Laterality Date  . A-FLUTTER ABLATION N/A 03/09/2020   Procedure: A-FLUTTER ABLATION;  Surgeon: Hillis Range, MD;  Location: [facility] INVASIVE CV LAB;  Service: Cardiovascular;  Laterality: N/A;  . CARDIOVERSION N/A 09/27/2013   Procedure: CARDIOVERSION;  Surgeon: Lewayne Bunting, MD;  Location[facility] ENDOSCOPY;  Service: Cardiovascular;  Laterality: N/A;  . RETINAL DETACHMENT SURGERY    . right knee arthroscopy    . TEE WITHOUT CARDIOVERSION N/A 09/27/2013   Procedure: TRANSESOPHAGEAL ECHOCARDIOGRAM (TEE);  Surgeon: Lewayne Bunting, MD;  Location: [facility] ENDOSCOPY;  Service: Cardiovascular;  Laterality: N/A;  . WRIST SURGERY       Social History:   reports that he has never smoked. He has never used smokeless tobacco. He reports current alcohol use. He reports that he does not use drugs.   Family History:  His family history includes Healthy in his brother and sister; Heart disease in his father and mother.   Allergies Allergies  Allergen Reactions  . Latex Itching    Skin turns real red     Home Medications  Prior to Admission medications   Medication Sig Start Date End Date Taking? Authorizing Provider  acetaminophen (TYLENOL) 500 MG tablet Take 1,000 mg by mouth every 6 (six) hours as needed for mild pain or headache.    [provider]  ascorbic acid (VITAMIN C) 500 MG tablet Take 500-1,000 mg by mouth 2 (two) times a week.    [provider]  cholecalciferol (VITAMIN D3) 25 MCG  (1000 UNIT) tablet Take 1,000 Units by mouth 2 (two) times a week.     [provider]  diltiazem (CARDIZEM CD) 120 MG 24 hr capsule Take 1 capsule (120 mg total) by mouth daily. 08/21/20   Allred, Fayrene Fearing, MD  diltiazem (CARDIZEM) 30 MG tablet Take 1 tablet (30 mg total) by mouth daily as needed (1-2 tablets every 4-6 hrs for fast heart rate.). 07/03/20   Allred, Fayrene Fearing, MD  EPINEPHrine 0.3 mg/0.3 mL IJ SOAJ injection Inject 0.3 mg into the muscle as needed for anaphylaxis. 02/07/20   [provider]  Probiotic Product (PROBIOTIC PO) Take 1 capsule by mouth 2 (two) times a week.    [provider]  vitamin Walter 180 MG (400 UNITS) capsule Take 400 Units by mouth 2 (two) times a week.    [provider]     Critical care time: 45 minutes

## 2020-09-25 NOTE — Progress Notes (Signed)
OT Cancellation Note  Patient Details Name: Walter [name] MRN: [id number] DOB: 29-Jun-1956   Cancelled Treatment:    Reason Eval/Treat Not Completed: Active bedrest order OT order received and appreciated however this conflicts with current bedrest/ sedated/ intubated order set. Please increase activity tolerance as appropriate and remove bedrest from orders. . Please contact OT at [phone] if bed rest order is discontinued. OT will hold evaluation at this time and will check back as time allows pending increased activity orders.   Wynona Neat, OTR/L  Acute Rehabilitation Services Pager: [phone] Office: [phone] .  09/25/2020, 8:33 AM

## 2020-09-25 NOTE — Progress Notes (Signed)
   09/25/20 0530  Provider Notification  Provider Name/Title Dr. Wilford Corner  Date Provider Notified 09/25/20  Time Provider Notified [phone]  Notification Type Call  Notification Reason Change in status (Patient's BP 78/55)  Provider response See new orders (Keep propofol off, 500 NS bolus)  Date of Provider Response 09/25/20  Time of Provider Response [phone]

## 2020-09-25 NOTE — ED Notes (Signed)
Transported to CT scan

## 2020-09-25 NOTE — ED Notes (Signed)
Patient intubated by EDP .

## 2020-09-25 NOTE — Progress Notes (Signed)
eLink Physician-Brief Progress Note Patient Name: Walter [name] DOB: [birth year] MRN: [id number]   Date of Service  09/25/2020  HPI/Events of Note  52 M history of HFpEF, afib/flutter not on anticoagulation as CHADS VASC 0, s/p ablation and cardioversion. Brought in due to altered sensorium, slurred speech and right sided weakness. Intubated in the ED for GCS 5. Code stroke called, head CT with dorsal left pontine hemorrhage with intraventricular extension.  Started on 3% saline.  eICU Interventions  On VAC 20/650/60%/5 PEEP peak pressure 21 Propofol was at 70 but now turned off due to hypotension Neurosurgery consulted with plan for reimaging in am. May need to investigate for vascular malformation Low dose SSI for glucose is in the 170s        [place] T Dontray Haberland 09/25/2020, 5:58 AM

## 2020-09-25 NOTE — ED Provider Notes (Signed)
[facility] EMERGENCY DEPARTMENT Provider Note   CSN: [id number] Arrival date & time: 09/25/20  6122  An emergency department physician performed an initial assessment on this suspected stroke patient at 0259.  History Chief Complaint - weakness  LEVEL 5 CAVEAT DUE TO ALTERED MENTAL STATUS  Walter [name] is a 65 y.o. male.  The history is provided by the EMS personnel.  Weakness Severity:  Severe Onset quality:  Sudden Timing:  Constant Progression:  Worsening Chronicity:  New Relieved by:  None tried Worsened by:  Nothing patient presents as code stroke LKW at 2200 on 03/27 Pt with confusion and right sided weakness No other details known on arrival All history provided by EMS     Past Medical History:  Diagnosis Date  . Atrial flutter (HCC)   . Dizzy 09/25/13  . Near syncope 09/25/13    Patient Active Problem List   Diagnosis Date Noted  . Unilateral primary osteoarthritis, left knee 09/26/2016  . Atrial flutter (HCC) 09/25/2013  . Near syncope 09/25/2013    Past Surgical History:  Procedure Laterality Date  . A-FLUTTER ABLATION N/A 03/09/2020   Procedure: A-FLUTTER ABLATION;  Surgeon: Hillis Range, MD;  Location: [facility] INVASIVE CV LAB;  Service: Cardiovascular;  Laterality: N/A;  . CARDIOVERSION N/A 09/27/2013   Procedure: CARDIOVERSION;  Surgeon: Lewayne Bunting, MD;  Location: [facility] ENDOSCOPY;  Service: Cardiovascular;  Laterality: N/A;  . RETINAL DETACHMENT SURGERY    . right knee arthroscopy    . TEE WITHOUT CARDIOVERSION N/A 09/27/2013   Procedure: TRANSESOPHAGEAL ECHOCARDIOGRAM (TEE);  Surgeon: Lewayne Bunting, MD;  Location: [facility] ENDOSCOPY;  Service: Cardiovascular;  Laterality: N/A;  . WRIST SURGERY         Family History  Problem Relation Age of Onset  . Heart disease Mother   . Heart disease Father   . Healthy Sister   . Healthy Brother     Social History   Tobacco Use  . Smoking status: Never Smoker  . Smokeless tobacco:  Never Used  Substance Use Topics  . Alcohol use: Yes    Comment: occasionally  . Drug use: No    Home Medications Prior to Admission medications   Medication Sig Start Date End Date Taking? Authorizing Provider  acetaminophen (TYLENOL) 500 MG tablet Take 1,000 mg by mouth every 6 (six) hours as needed for mild pain or headache.    [provider]  ascorbic acid (VITAMIN C) 500 MG tablet Take 500-1,000 mg by mouth 2 (two) times a week.    [provider]  cholecalciferol (VITAMIN D3) 25 MCG (1000 UNIT) tablet Take 1,000 Units by mouth 2 (two) times a week.     [provider]  diltiazem (CARDIZEM CD) 120 MG 24 hr capsule Take 1 capsule (120 mg total) by mouth daily. 08/21/20   Allred, Fayrene Fearing, MD  diltiazem (CARDIZEM) 30 MG tablet Take 1 tablet (30 mg total) by mouth daily as needed (1-2 tablets every 4-6 hrs for fast heart rate.). 07/03/20   Allred, Fayrene Fearing, MD  EPINEPHrine 0.3 mg/0.3 mL IJ SOAJ injection Inject 0.3 mg into the muscle as needed for anaphylaxis. 02/07/20   [provider]  Probiotic Product (PROBIOTIC PO) Take 1 capsule by mouth 2 (two) times a week.    [provider]  vitamin E 180 MG (400 UNITS) capsule Take 400 Units by mouth 2 (two) times a week.    [provider]    Allergies    Latex  Review of Systems   Review of Systems  Unable to perform ROS: Mental status change  Neurological: Positive for weakness.    Physical Exam Updated Vital Signs BP 121/74   Pulse 60   Temp 97.6 F (36.4 C) (Temporal)   Resp 16   Ht 1.88 m (6\' 2" )   Wt 107.7 kg   SpO2 100%   BMI 30.48 kg/m   Physical Exam CONSTITUTIONAL: elderly, ill appearing HEAD: Normocephalic/atraumatic EYES: pupils equal/downward gaze noted ENMT: Mucous membranes moist NECK: supple no meningeal signs CV: S1/S2 noted, no murmurs/rubs/gallops noted LUNGS: Lungs are clear to auscultation bilaterally, no apparent distress ABDOMEN: soft,  nontender/nondistended NEURO: Pt is resting with eyes closed.  He will respond to pain on his left upper/lower extremity.  GCS 7-8.  Limited neurologic exam due to emergent condition EXTREMITIES: pulses normal/equal, full ROM, no deformities SKIN: warm, color normal PSYCH: unable to assess  ED Results / Procedures / Treatments   Labs (all labs ordered are listed, but only abnormal results are displayed) Labs Reviewed  COMPREHENSIVE METABOLIC PANEL - Abnormal; Notable for the following components:      Result Value   Glucose, Bld 169 (*)    Calcium 8.8 (*)    Total Protein 6.1 (*)    All other components within normal limits  CBG MONITORING, ED - Abnormal; Notable for the following components:   Glucose-Capillary 166 (*)    All other components within normal limits  I-STAT CHEM 8, ED - Abnormal; Notable for the following components:   Glucose, Bld 170 (*)    All other components within normal limits  RESP PANEL BY RT-PCR (FLU A&B, COVID) ARPGX2  ETHANOL  PROTIME-INR  APTT  CBC  DIFFERENTIAL  RAPID URINE DRUG SCREEN, HOSP PERFORMED  URINALYSIS, ROUTINE W REFLEX MICROSCOPIC  HEMOGLOBIN A1C    EKG ED ECG REPORT   Date: 03/28/20220347  Rate: 64  Rhythm: normal sinus rhythm  QRS Axis: normal  Intervals: normal  ST/T Wave abnormalities: normal  Conduction Disutrbances:none PVC noted  I have personally reviewed the EKG tracing and agree with the computerized printout as noted.  Prehospital EKG reveals atrial fibrillation  Radiology CT ANGIO HEAD W OR WO CONTRAST  Result Date: 09/25/2020 CLINICAL DATA:  Right-sided weakness EXAM: CT ANGIOGRAPHY HEAD AND NECK TECHNIQUE: Multidetector CT imaging of the head and neck was performed using the standard protocol during bolus administration of intravenous contrast. Multiplanar CT image reconstructions and MIPs were obtained to evaluate the vascular anatomy. Carotid stenosis measurements (when applicable) are obtained utilizing  NASCET criteria, using the distal internal carotid diameter as the denominator. CONTRAST:  75mL OMNIPAQUE IOHEXOL 350 MG/ML SOLN COMPARISON:  None. FINDINGS: CTA NECK FINDINGS SKELETON: There is no bony spinal canal stenosis. No lytic or blastic lesion. OTHER NECK: Normal pharynx, larynx and major salivary glands. No cervical lymphadenopathy. Unremarkable thyroid gland. UPPER CHEST: No pneumothorax or pleural effusion. No nodules or masses. AORTIC ARCH: There is no calcific atherosclerosis of the aortic arch. There is no aneurysm, dissection or hemodynamically significant stenosis of the visualized portion of the aorta. Conventional 3 vessel aortic branching pattern. The visualized proximal subclavian arteries are widely patent. RIGHT CAROTID SYSTEM: Normal without aneurysm, dissection or stenosis. LEFT CAROTID SYSTEM: Normal without aneurysm, dissection or stenosis. VERTEBRAL ARTERIES: Left dominant configuration. Both origins are clearly patent. There is no dissection, occlusion or flow-limiting stenosis to the skull base (V1-V3 segments). CTA HEAD FINDINGS POSTERIOR CIRCULATION: --Vertebral arteries: Normal V4 segments. --Inferior cerebellar arteries: Normal. --Basilar  artery: Normal. --Superior cerebellar arteries: Normal. --Posterior cerebral arteries (PCA): Normal. ANTERIOR CIRCULATION: --Intracranial internal carotid arteries: Normal. --Anterior cerebral arteries (ACA): Normal. Both A1 segments are present. Patent anterior communicating artery (a-comm). --Middle cerebral arteries (MCA): Normal. VENOUS SINUSES: As permitted by contrast timing, patent. ANATOMIC VARIANTS: None Review of the MIP images confirms the above findings. 5 minute delay head CT shows unchanged size of dorsal left pontine hemorrhage with intraventricular extension. IMPRESSION: 1. No vascular malformation, anomaly or aneurysm. 2. No emergent large vessel occlusion or high-grade stenosis of the intracranial arteries. 3. Unchanged size of  dorsal left pontine hemorrhage with intraventricular extension. These results were called by telephone at the time of interpretation on 09/25/2020 at 3:32 am to provider [facility] , who verbally acknowledged these results. Electronically Signed   By: Deatra Robinson M.D.   On: 09/25/2020 03:33   CT ANGIO NECK W OR WO CONTRAST  Result Date: 09/25/2020 CLINICAL DATA:  Right-sided weakness EXAM: CT ANGIOGRAPHY HEAD AND NECK TECHNIQUE: Multidetector CT imaging of the head and neck was performed using the standard protocol during bolus administration of intravenous contrast. Multiplanar CT image reconstructions and MIPs were obtained to evaluate the vascular anatomy. Carotid stenosis measurements (when applicable) are obtained utilizing NASCET criteria, using the distal internal carotid diameter as the denominator. CONTRAST:  1mL OMNIPAQUE IOHEXOL 350 MG/ML SOLN COMPARISON:  None. FINDINGS: CTA NECK FINDINGS SKELETON: There is no bony spinal canal stenosis. No lytic or blastic lesion. OTHER NECK: Normal pharynx, larynx and major salivary glands. No cervical lymphadenopathy. Unremarkable thyroid gland. UPPER CHEST: No pneumothorax or pleural effusion. No nodules or masses. AORTIC ARCH: There is no calcific atherosclerosis of the aortic arch. There is no aneurysm, dissection or hemodynamically significant stenosis of the visualized portion of the aorta. Conventional 3 vessel aortic branching pattern. The visualized proximal subclavian arteries are widely patent. RIGHT CAROTID SYSTEM: Normal without aneurysm, dissection or stenosis. LEFT CAROTID SYSTEM: Normal without aneurysm, dissection or stenosis. VERTEBRAL ARTERIES: Left dominant configuration. Both origins are clearly patent. There is no dissection, occlusion or flow-limiting stenosis to the skull base (V1-V3 segments). CTA HEAD FINDINGS POSTERIOR CIRCULATION: --Vertebral arteries: Normal V4 segments. --Inferior cerebellar arteries: Normal. --Basilar artery:  Normal. --Superior cerebellar arteries: Normal. --Posterior cerebral arteries (PCA): Normal. ANTERIOR CIRCULATION: --Intracranial internal carotid arteries: Normal. --Anterior cerebral arteries (ACA): Normal. Both A1 segments are present. Patent anterior communicating artery (a-comm). --Middle cerebral arteries (MCA): Normal. VENOUS SINUSES: As permitted by contrast timing, patent. ANATOMIC VARIANTS: None Review of the MIP images confirms the above findings. 5 minute delay head CT shows unchanged size of dorsal left pontine hemorrhage with intraventricular extension. IMPRESSION: 1. No vascular malformation, anomaly or aneurysm. 2. No emergent large vessel occlusion or high-grade stenosis of the intracranial arteries. 3. Unchanged size of dorsal left pontine hemorrhage with intraventricular extension. These results were called by telephone at the time of interpretation on 09/25/2020 at 3:32 am to provider [facility] , who verbally acknowledged these results. Electronically Signed   By: Deatra Robinson M.D.   On: 09/25/2020 03:33   DG Chest Port 1 View  Result Date: 09/25/2020 CLINICAL DATA:  Status post intubation EXAM: PORTABLE CHEST 1 VIEW COMPARISON:  09/25/2013 FINDINGS: Cardiac shadow is enlarged but stable. Endotracheal tube and gastric catheter are noted in satisfactory position. The overall inspiratory effort is poor. No bony abnormality is noted. IMPRESSION: Tubes and lines as described above.  No acute abnormality noted. Electronically Signed   By: Alcide Clever M.D.   On:  09/25/2020 03:49   CT HEAD CODE STROKE WO CONTRAST  Result Date: 09/25/2020 CLINICAL DATA:  Code stroke.  Right-sided weakness EXAM: CT HEAD WITHOUT CONTRAST TECHNIQUE: Contiguous axial images were obtained from the base of the skull through the vertex without intravenous contrast. COMPARISON:  None. FINDINGS: Brain: Acute intraparenchymal hemorrhage in the dorsal left pons with subarachnoid extension into the cerebral aqueduct and  fourth ventricle. No midline shift or other mass effect. No hydrocephalus. Vascular: No abnormal hyperdensity of the major intracranial arteries or dural venous sinuses. No intracranial atherosclerosis. Skull: The visualized skull base, calvarium and extracranial soft tissues are normal. Sinuses/Orbits: No fluid levels or advanced mucosal thickening of the visualized paranasal sinuses. No mastoid or middle ear effusion. The orbits are normal. IMPRESSION: Acute intraparenchymal hemorrhage in the dorsal left pons with subarachnoid extension into the cerebral aqueduct and fourth ventricle. Critical Value/emergent results were called by telephone at the time of interpretation on 09/25/2020 at 3:16 am to provider [facility], who verbally acknowledged these results. Electronically Signed   By: Deatra Robinson M.D.   On: 09/25/2020 03:16    Procedures .Critical Care Performed by: Zadie Rhine, MD Authorized by: Zadie Rhine, MD   Critical care provider statement:    Critical care time (minutes):  35   Critical care start time:  09/25/2020 3:20 AM   Critical care end time:  09/25/2020 3:55 AM   Critical care time was exclusive of:  Separately billable procedures and treating other patients   Critical care was necessary to treat or prevent imminent or life-threatening deterioration of the following conditions:  Respiratory failure and CNS failure or compromise   Critical care was time spent personally by me on the following activities:  Evaluation of patient's response to treatment, examination of patient, pulse oximetry, re-evaluation of patient's condition, ordering and review of radiographic studies, ordering and review of laboratory studies, ordering and performing treatments and interventions, discussions with consultants, development of treatment plan with patient or surrogate, obtaining history from patient or surrogate and review of old charts   I assumed direction of critical care for this  patient from another provider in my specialty: no     Care discussed with: admitting provider   Procedure Name: Intubation Date/Time: 09/25/2020 3:00 AM Performed by: Zadie Rhine, MD Pre-anesthesia Checklist: Emergency Drugs available and Suction available Oxygen Delivery Method: Non-rebreather mask Preoxygenation: Pre-oxygenation with 100% oxygen Induction Type: Rapid sequence Laryngoscope Size: Glidescope Grade View: Grade I Tube size: 7.5 mm Number of attempts: 1 Airway Equipment and Method: Video-laryngoscopy Placement Confirmation: ETT inserted through vocal cords under direct vision,  CO2 detector and Breath sounds checked- equal and bilateral Secured at: 25 cm Tube secured with: ETT holder        Medications Ordered in ED Medications  propofol (DIPRIVAN) 1000 MG/100ML infusion (10 mcg/kg/min  107.7 kg Intravenous New Bag/Given 09/25/20 0352)   stroke: mapping our early stages of recovery book (has no administration in time range)  acetaminophen (TYLENOL) tablet 650 mg (has no administration in time range)    Or  acetaminophen (TYLENOL) 160 MG/5ML solution 650 mg (has no administration in time range)    Or  acetaminophen (TYLENOL) suppository 650 mg (has no administration in time range)  senna-docusate (Senokot-S) tablet 1 tablet (has no administration in time range)  pantoprazole (PROTONIX) injection 40 mg (has no administration in time range)  labetalol (NORMODYNE) injection 20 mg (20 mg Intravenous Not Given 09/25/20 0352)    And  clevidipine (CLEVIPREX) infusion 0.5  mg/mL (0 mg/hr Intravenous Not Given 09/25/20 0353)  iohexol (OMNIPAQUE) 350 MG/ML injection 75 mL (75 mLs Intravenous Contrast Given 09/25/20 0321)  rocuronium bromide 100 MG/10ML SOSY (80 mg  Given 09/25/20 0339)  etomidate (AMIDATE) 2 MG/ML injection (20 mg  Given 09/25/20 9449)    ED Course  I have reviewed the triage vital signs and the nursing notes.  Pertinent labs & imaging results that were  available during my care of the patient were reviewed by me and considered in my medical decision making (see chart for details).    MDM Rules/Calculators/A&P                          3:59 AM Patient seen on arrival.  Patient last known well at approximately 2200 on March 27.  Patient woke wife up reporting ear pain and had weakness.  On arrival patient was confused/altered, GCS score 7-8 Patient was a code stroke, went emergently to CT scanner which revealed intracranial hemorrhage.  Once he was placed in our ED room he was intubated immediately due to altered mental status. Patient tolerated intubation well.  Sedation has been ordered. Discussed the case with Dr. Wilford Corner with neurology He is coordinating with neurosurgery as patient may end up needing ventricular drain and other surgical interventions. I discussed the case with his wife, [name], via phone. Patient to be admitted in critical condition Final Clinical Impression(s) / ED Diagnoses Final diagnoses:  Intracranial hemorrhage (HCC)  Encephalopathy    Rx / DC Orders ED Discharge Orders    None       Zadie Rhine, MD 09/25/20 [id number]

## 2020-09-25 NOTE — H&P (Signed)
Neurology H&P ICH  Reason for Consult: Code stroke-right-sided weakness, headache, vomiting Referring Physician: Dr. Bebe Shaggy, ED provider  CC: Right-sided weakness, slurred speech, headache, vomiting  History is obtained from: Chart review-attempted to call wife 3 times by 3:45 AM with no response  HPI: Walter [name] is a 65 y.o. male past medical history of atrial flutter newly diagnosed with atrial fibrillation in February not on anticoagulation, follows with Dr. Johney Frame in cardiology, last known well when he went to bed at 10 PM on 09/24/2020 and woke up at around 2 AM this morning and complained to his wife that he was having some discomfort in his head or ear. She was unable to understand him well and thought his speech was slurred.  EMS was called.  He was very drowsy and sleepy upon their assessment and was weaker on the right side in comparison to the left.  His vitals were not too abnormal-systolic blood pressures were in the 1 40-1 50 range on scene. He is not on any anticoagulation.  No prior history of strokes. He was brought in as an acute code stroke LVO positive. Evaluated at the ED bridge, with a GCS of 5. Right after the stat CT and CTA, he was emergently intubated due to inability to protect his airway-given GCS of 5.  Wife arrived much later. Provided history that nothing was out of the ordinary when he went to bed at 10 PM. Recent trip to [place] is the only travel history. She said that they have been cleaning their attic and there is although they did over the weekend.    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 Atrial flutter (HCC)   . Dizzy 09/25/13  . Near syncope 09/25/13    Family History  Problem Relation Age of Onset  . Heart disease Mother   . Heart  disease Father   . Healthy Sister   . Healthy Brother    Social History:   reports that he has never smoked. He has never used smokeless tobacco. He reports current alcohol use. He reports that he does not use drugs.  Medications No current facility-administered medications for this encounter.  Current Outpatient Medications:  .  acetaminophen (TYLENOL) 500 MG tablet, Take 1,000 mg by mouth every 6 (six) hours as needed for mild pain or headache., Disp: , Rfl:  .  ascorbic acid (VITAMIN C) 500 MG tablet, Take 500-1,000 mg by mouth 2 (two) times a week., Disp: , Rfl:  .  cholecalciferol (VITAMIN D3) 25 MCG (1000 UNIT) tablet, Take 1,000 Units by mouth 2 (two) times a week. , Disp: , Rfl:  .  diltiazem (CARDIZEM CD) 120 MG 24 hr capsule, Take 1 capsule (120 mg total) by mouth daily., Disp: 90 capsule, Rfl: 3 .  diltiazem (CARDIZEM) 30 MG tablet, Take 1 tablet (30 mg total) by mouth daily as needed (1-2 tablets every 4-6 hrs for fast heart rate.)., Disp: 30 tablet, Rfl: 3 .  EPINEPHrine 0.3 mg/0.3 mL IJ SOAJ injection, Inject 0.3 mg into the muscle as needed for anaphylaxis., Disp: , Rfl:  .  Probiotic Product (PROBIOTIC PO), Take 1 capsule by mouth 2 (two) times a week., Disp: , Rfl:  .  vitamin E 180 MG (400 UNITS) capsule, Take 400 Units by mouth  2 (two) times a week., Disp: , Rfl:   Exam: Current vital signs: BP 121/74   Pulse 60   Temp 97.6 F (36.4 C) (Temporal)   Resp 16   Ht 6\' 2"  (1.88 m)   Wt 107.7 kg   SpO2 100%   BMI 30.48 kg/m  Vital signs in last 24 hours: Temp:  [97.6 F (36.4 C)] 97.6 F (36.4 C) (03/28 0345) Pulse Rate:  [60-122] 60 (03/28 0350) Resp:  [15-26] 16 (03/28 0350) BP: (117-187)/(68-112) 121/74 (03/28 0350) SpO2:  [92 %-100 %] 100 % (03/28 0350) FiO2 (%):  [100 %] 100 % (03/28 0339) Weight:  [107.7 kg] 107.7 kg (03/28 0334) General: Extremely drowsy, does not open eyes to voice.  Does not follow any commands. HEENT: Normocephalic atraumatic CVS:  Irregularly irregular, no murmurs Respiratory: Shallow breathing with lot of thick sputum that he is throwing up. Abdomen nondistended nontender Extremities warm well perfused without edema Neurological exam Extremely drowsy, does not open eyes to voice.  Does not follow commands. Nonverbal Cranial nerves: Pupils equal round reactive to light, resolving from either side, facial symmetry is preserved, according to report from EMS, his tongue was midline-I could not get him to stick his tongue out. Motor exam: Had some spontaneous movement more on the left in comparison to the right. On noxious stimulation, mild withdrawal of the right lower extremity in comparison to strong withdrawal of the left lower extremity.  Minimal withdrawal of the right upper extremity in comparison to strong withdrawal of the left upper extremity. Sensory exam: As above Coordination cannot be assessed NIHSS-22 GCS 5 ICH score 3  Labs I have reviewed labs in epic and the results pertinent to this consultation are: CBC    Component Value Date/Time   WBC 6.7 09/25/2020 0309   RBC 4.89 09/25/2020 0309   HGB 15.6 09/25/2020 0328   HGB 14.8 03/02/2020 0853   HCT 46.0 09/25/2020 0328   HCT 43.2 03/02/2020 0853   PLT 214 09/25/2020 0309   PLT 209 03/02/2020 0853   MCV 93.5 09/25/2020 0309   MCV 96 03/02/2020 0853   MCH 31.3 09/25/2020 0309   MCHC 33.5 09/25/2020 0309   RDW 12.7 09/25/2020 0309   RDW 12.6 03/02/2020 0853   LYMPHSABS 3.7 09/25/2020 0309   LYMPHSABS 2.2 03/02/2020 0853   MONOABS 0.6 09/25/2020 0309   EOSABS 0.2 09/25/2020 0309   EOSABS 0.2 03/02/2020 0853   BASOSABS 0.1 09/25/2020 0309   BASOSABS 0.1 03/02/2020 0853    CMP     Component Value Date/Time   NA 140 09/25/2020 0328   NA 141 03/02/2020 0853   K 3.6 09/25/2020 0328   CL 102 09/25/2020 0328   CO2 25 09/25/2020 0309   GLUCOSE 170 (H) 09/25/2020 0328   BUN 18 09/25/2020 0328   BUN 13 03/02/2020 0853   CREATININE 0.90  09/25/2020 0328   CALCIUM 8.8 (L) 09/25/2020 0309   PROT 6.1 (L) 09/25/2020 0309   ALBUMIN 3.7 09/25/2020 0309   AST 15 09/25/2020 0309   ALT 14 09/25/2020 0309   ALKPHOS 51 09/25/2020 0309   BILITOT 0.9 09/25/2020 0309   GFRNONAA >60 09/25/2020 0309   GFRAA 94 03/02/2020 0853   Imaging I have reviewed the images obtained:  CT-scan of the brain Intraparenchymal hemorrhage seems to be in the dorsal left midbrain and pons with extension into the cerebral aqueduct and fourth ventricle.  Local mass-effect on the brainstem.  No frank hydrocephalus at this time.  CT angiogram head and neck: No vascular malformation anomaly or aneurysm identified.  No emergent LVO.  Unchanged size of the dorsal left midbrain/pontine hemorrhage.  No frank hydrocephalus.  Assessment:  65 year old man with a history of atrial flutter status post ablation, recent diagnosis of atrial fibrillation not on anticoagulation and otherwise good health, presenting with sudden onset of slurred speech, confusion, right-sided weakness and noted to have a hemorrhage involving the left midbrain and pons with extension into the cerebral aqueduct and fourth ventricle without any hydrocephalus at this time but with a Glasgow Coma Scale of 5. Had to be emergently intubated. CT head and neck did not show any gross vascular malformation. No evidence of frank hydrocephalus on the CT head. Unclear etiology of the intracerebral hemorrhage.  Pretty atypical looking for a primary hypertensive ICH, and no hypertension on arrival make this quite puzzling.  Plan:  Brainstem ICH, nontraumatic, IVH, nontraumatic  Acuity: Acute Laterality: Right midbrain and pons Current suspected etiology: Under investigation Treatment: -Admit to neurological ICU -ICH Score: 3 -which portends a 72% 30-day mortality. -ICH Volume: Less than 30 cc -BP control goal SYS<140 -Neurosurgery consulted for possible EVD and consideration for diagnostic cerebral  angiogram for underlying vascular malformation -PT/OT/ST  -neuromonitoring  CNS Cerebral edema-local mass-effect around the bleed. Compression of brain due to ICH -Close neuro monitoring -Start on 3% at 75 cc an hour after bolus-goal sodium 1 50-1 55. -Close neuro monitoring  High risk for obstructive hydrocephalus -Monitor closely -Repeat head CT in 6 hours -Neurosurgery on board-EVD per neurosurgery  Dysarthria Dysphagia following ICH  -NPO  Hemiplegia and hemiparesis following nontraumatic intracerebral hemorrhage affecting right dominant side  -Continue PT/OT/ST  RESP Acute Respiratory Failure due to intracerebral hemorrhage -vent management per ICU -wean when able  CV No history of hypertension.  Did not come in hypertensive -Given ICH, goal systolic blood pressure less than 140. -Labetalol and hydralazine as needed.  Use Cleviprex drip if multiple as needed's are used. -Get a transthoracic echo   Atrial flutter and chronic atrial -fibrillation -Rate control  GI/GU No active issues Check labs in the morning  HEME Not on any anticoagulants No active issues Check labs in the morning   ENDO No history of endocrine disorders Check A1c-goal A1c less than 7  Fluid/Electrolyte Disorders Normal sodium, normal potassium. Check BMP tomorrow.  Replete as necessary  ID Possible Aspiration PNA -CXR -NPO -Monitor   Nutrition E66.9 Obesity  -diet consult  Prophylaxis DVT: SCD-no antiplatelets or anticoagulants due to ICH and IVH. GI: PPI Bowel: Docusate and senna  Dispo: To be determined  Diet: NPO  Code Status: Full Code  Clinical condition remains extremely critical.  Discussed the imaging findings and the plan with the wife at bedside in detail.  She is obviously in shock at this time.  Patient has 2 grandchildren who live in [place] in [place]. The wife is still waiting to speak with the daughter who is an Charity fundraiser before calling the children as  she is having a hard time grasping the situation.  I offered to call in the chaplain and she said she is fine without one for now. I have handed out a paper to her with the diagnosis return along with the location of the neurological ICU, direct line to the neurological ICU, my name and the name of my colleague taking over service-so that she has all the information handy when her children are called.  I discussed with her that he is extremely critical  at this time in the next 24 to 48 hours are going to be extremely critical as well.  I have also discussed with her that I have talked to the neurosurgery team and they will be assessing her as well to look for the need for EVD as well as possibility for an angiogram depending on how he does clinically.  Discussed the plan in detail with Dr. Bebe Shaggy. Discussed the case over the phone with Dr. Conchita Paris from neurosurgery. Discussed the case with Dr. Jayme Cloud from [facility] and requested consultation for medical and vent management.   THE FOLLOWING WERE PRESENT ON ADMISSION: Intracerebral hemorrhage, intraventricular hemorrhage, high risk for obstructive hydrocephalus, hemiplegia, cerebral edema, ventilator dependent respiratory failure, probable aspiration pneumonia, atrial fibrillation, atrial flutter   CRITICAL CARE ATTESTATION Performed by: Milon Dikes, MD Total critical care time: 70 minutes Critical care time was exclusive of separately billable procedures and treating other patients and/or supervising APPs/Residents/Students Critical care was necessary to treat or prevent imminent or life-threatening deterioration due to ICH, IVH, vent dependent respiratory failure.  This patient is critically ill and at significant risk for neurological worsening and/or death and care requires constant monitoring. Critical care was time spent personally by me on the following activities: development of treatment plan with patient and/or surrogate as well as  nursing, discussions with consultants, evaluation of patient's response to treatment, examination of patient, obtaining history from patient or surrogate, ordering and performing treatments and interventions, ordering and review of laboratory studies, ordering and review of radiographic studies, pulse oximetry, re-evaluation of patient's condition, participation in multidisciplinary rounds and medical decision making of high complexity in the care of this patient.

## 2020-09-25 NOTE — Progress Notes (Signed)
SLP Cancellation Note  Patient Details Name: Walter [name] MRN: [id number] DOB: 01/09/1956   Cancelled treatment:        Pt is intubated and sedated. Will follow.    Royce Macadamia 09/25/2020, 8:47 AM   Breck Coons Lonell Face.Ed Nurse, children's [phone] Office [phone]

## 2020-09-25 NOTE — Progress Notes (Addendum)
STROKE TEAM PROGRESS NOTE   INTERVAL HISTORY His wife is at the bedside.  She reports that he went to bed fine. But around 2 AM he woke her up stating he had ear pain and a headache. His wife noticed he had trouble speaking so she called 911. He had Right sided weakness on evaluation by EMS.When he got to the ED he started having trouble protecting his airway so he was intubated.  CCM is following. Vitals:   09/25/20 0645 09/25/20 0700 09/25/20 0715 09/25/20 0738  BP: 137/82 119/68 123/73   Pulse: 66 60 63 63  Resp: '15 16 20 20  ' Temp: 98.06 F (36.7 C) 98.24 F (36.8 C) 98.42 F (36.9 C) 98.6 F (37 C)  TempSrc:      SpO2: 100% 100% 100%   Weight:      Height:       CBC:  Recent Labs  Lab 09/25/20 0309 09/25/20 0328 09/25/20 0501  WBC 6.7  --   --   NEUTROABS 2.2  --   --   HGB 15.3 15.6 15.0  HCT 45.7 46.0 44.0  MCV 93.5  --   --   PLT 214  --   --    Basic Metabolic Panel:  Recent Labs  Lab 09/25/20 0309 09/25/20 0328 09/25/20 0501 09/25/20 0615  NA 137 140 139 137  K 3.6 3.6 4.2  --   CL 104 102  --   --   CO2 25  --   --   --   GLUCOSE 169* 170*  --   --   BUN 15 18  --   --   CREATININE 1.00 0.90  --   --   CALCIUM 8.8*  --   --   --    Lipid Panel: No results for input(s): CHOL, TRIG, HDL, CHOLHDL, VLDL, LDLCALC in the last 168 hours. HgbA1c:  Recent Labs  Lab 09/25/20 0309  HGBA1C 5.7*   Urine Drug Screen:  Recent Labs  Lab 09/25/20 0304  LABOPIA NONE DETECTED  COCAINSCRNUR NONE DETECTED  LABBENZ NONE DETECTED  AMPHETMU NONE DETECTED  THCU NONE DETECTED  LABBARB NONE DETECTED    Alcohol Level  Recent Labs  Lab 09/25/20 0309  ETH <10    IMAGING past 24 hours CT ANGIO HEAD W OR WO CONTRAST  Result Date: 09/25/2020 CLINICAL DATA:  Right-sided weakness EXAM: CT ANGIOGRAPHY HEAD AND NECK TECHNIQUE: Multidetector CT imaging of the head and neck was performed using the standard protocol during bolus administration of intravenous contrast.  Multiplanar CT image reconstructions and MIPs were obtained to evaluate the vascular anatomy. Carotid stenosis measurements (when applicable) are obtained utilizing NASCET criteria, using the distal internal carotid diameter as the denominator. CONTRAST:  31m OMNIPAQUE IOHEXOL 350 MG/ML SOLN COMPARISON:  None. FINDINGS: CTA NECK FINDINGS SKELETON: There is no bony spinal canal stenosis. No lytic or blastic lesion. OTHER NECK: Normal pharynx, larynx and major salivary glands. No cervical lymphadenopathy. Unremarkable thyroid gland. UPPER CHEST: No pneumothorax or pleural effusion. No nodules or masses. AORTIC ARCH: There is no calcific atherosclerosis of the aortic arch. There is no aneurysm, dissection or hemodynamically significant stenosis of the visualized portion of the aorta. Conventional 3 vessel aortic branching pattern. The visualized proximal subclavian arteries are widely patent. RIGHT CAROTID SYSTEM: Normal without aneurysm, dissection or stenosis. LEFT CAROTID SYSTEM: Normal without aneurysm, dissection or stenosis. VERTEBRAL ARTERIES: Left dominant configuration. Both origins are clearly patent. There is no dissection, occlusion or flow-limiting  stenosis to the skull base (V1-V3 segments). CTA HEAD FINDINGS POSTERIOR CIRCULATION: --Vertebral arteries: Normal V4 segments. --Inferior cerebellar arteries: Normal. --Basilar artery: Normal. --Superior cerebellar arteries: Normal. --Posterior cerebral arteries (PCA): Normal. ANTERIOR CIRCULATION: --Intracranial internal carotid arteries: Normal. --Anterior cerebral arteries (ACA): Normal. Both A1 segments are present. Patent anterior communicating artery (a-comm). --Middle cerebral arteries (MCA): Normal. VENOUS SINUSES: As permitted by contrast timing, patent. ANATOMIC VARIANTS: None Review of the MIP images confirms the above findings. 5 minute delay head CT shows unchanged size of dorsal left pontine hemorrhage with intraventricular extension. IMPRESSION:  1. No vascular malformation, anomaly or aneurysm. 2. No emergent large vessel occlusion or high-grade stenosis of the intracranial arteries. 3. Unchanged size of dorsal left pontine hemorrhage with intraventricular extension. These results were called by telephone at the time of interpretation on 09/25/2020 at 3:32 am to provider [facility] , who verbally acknowledged these results. Electronically Signed   By: Ulyses Jarred M.D.   On: 09/25/2020 03:33   CT ANGIO NECK W OR WO CONTRAST  Result Date: 09/25/2020 CLINICAL DATA:  Right-sided weakness EXAM: CT ANGIOGRAPHY HEAD AND NECK TECHNIQUE: Multidetector CT imaging of the head and neck was performed using the standard protocol during bolus administration of intravenous contrast. Multiplanar CT image reconstructions and MIPs were obtained to evaluate the vascular anatomy. Carotid stenosis measurements (when applicable) are obtained utilizing NASCET criteria, using the distal internal carotid diameter as the denominator. CONTRAST:  32m OMNIPAQUE IOHEXOL 350 MG/ML SOLN COMPARISON:  None. FINDINGS: CTA NECK FINDINGS SKELETON: There is no bony spinal canal stenosis. No lytic or blastic lesion. OTHER NECK: Normal pharynx, larynx and major salivary glands. No cervical lymphadenopathy. Unremarkable thyroid gland. UPPER CHEST: No pneumothorax or pleural effusion. No nodules or masses. AORTIC ARCH: There is no calcific atherosclerosis of the aortic arch. There is no aneurysm, dissection or hemodynamically significant stenosis of the visualized portion of the aorta. Conventional 3 vessel aortic branching pattern. The visualized proximal subclavian arteries are widely patent. RIGHT CAROTID SYSTEM: Normal without aneurysm, dissection or stenosis. LEFT CAROTID SYSTEM: Normal without aneurysm, dissection or stenosis. VERTEBRAL ARTERIES: Left dominant configuration. Both origins are clearly patent. There is no dissection, occlusion or flow-limiting stenosis to the skull base  (V1-V3 segments). CTA HEAD FINDINGS POSTERIOR CIRCULATION: --Vertebral arteries: Normal V4 segments. --Inferior cerebellar arteries: Normal. --Basilar artery: Normal. --Superior cerebellar arteries: Normal. --Posterior cerebral arteries (PCA): Normal. ANTERIOR CIRCULATION: --Intracranial internal carotid arteries: Normal. --Anterior cerebral arteries (ACA): Normal. Both A1 segments are present. Patent anterior communicating artery (a-comm). --Middle cerebral arteries (MCA): Normal. VENOUS SINUSES: As permitted by contrast timing, patent. ANATOMIC VARIANTS: None Review of the MIP images confirms the above findings. 5 minute delay head CT shows unchanged size of dorsal left pontine hemorrhage with intraventricular extension. IMPRESSION: 1. No vascular malformation, anomaly or aneurysm. 2. No emergent large vessel occlusion or high-grade stenosis of the intracranial arteries. 3. Unchanged size of dorsal left pontine hemorrhage with intraventricular extension. These results were called by telephone at the time of interpretation on 09/25/2020 at 3:32 am [facility], who verbally acknowledged these results. Electronically Signed   By: KUlyses JarredM.D.   On: 09/25/2020 03:33   DG Chest Port 1 View  Result Date: 09/25/2020 CLINICAL DATA:  Status post intubation EXAM: PORTABLE CHEST 1 VIEW COMPARISON:  09/25/2013 FINDINGS: Cardiac shadow is enlarged but stable. Endotracheal tube and gastric catheter are noted in satisfactory position. The overall inspiratory effort is poor. No bony abnormality is noted. IMPRESSION: Tubes  and lines as described above.  No acute abnormality noted. Electronically Signed   By: Inez Catalina M.D.   On: 09/25/2020 03:49   CT HEAD CODE STROKE WO CONTRAST  Result Date: 09/25/2020 CLINICAL DATA:  Code stroke.  Right-sided weakness EXAM: CT HEAD WITHOUT CONTRAST TECHNIQUE: Contiguous axial images were obtained from the base of the skull through the vertex without intravenous  contrast. COMPARISON:  None. FINDINGS: Brain: Acute intraparenchymal hemorrhage in the dorsal left pons with subarachnoid extension into the cerebral aqueduct and fourth ventricle. No midline shift or other mass effect. No hydrocephalus. Vascular: No abnormal hyperdensity of the major intracranial arteries or dural venous sinuses. No intracranial atherosclerosis. Skull: The visualized skull base, calvarium and extracranial soft tissues are normal. Sinuses/Orbits: No fluid levels or advanced mucosal thickening of the visualized paranasal sinuses. No mastoid or middle ear effusion. The orbits are normal. IMPRESSION: Acute intraparenchymal hemorrhage in the dorsal left pons with subarachnoid extension into the cerebral aqueduct and fourth ventricle. Critical Value/emergent results were called by telephone at the time of interpretation on 09/25/2020 at 3:16 am to provider [facility], who verbally acknowledged these results. Electronically Signed   By: Ulyses Jarred M.D.   On: 09/25/2020 03:16    PHYSICAL EXAM Blood pressure 123/73, pulse 63, temperature 98.6 F (37 C), resp. rate 20, height '6\' 2"'  (1.88 m), weight 101 kg, SpO2 100 %.  General: sedated and intubated, elderly caucasian male, no apparent distress  Lungs: Symmetrical Chest rise, no labored breathing  Cardio: Regular Rate and Rhythm  Abdomen: Soft, non-tender  Neurological exam: Sedated and intubated.  Eyes are closed.  Pupils small 2 mm reactive.  Doll's eye movements are sluggish.  Eyes are in primary position.  Fundi could not be visualized.  Not able to follow any commands.  Grimaces to pain symmetrically.  Moves all 4 extremities intermittently spontaneously but not to commands.  This withdrawal in all 4 extremities.  No focal weakness.  Plantars both equivocal.    ASSESSMENT/PLAN Mr. GRIFFIN GERRARD is a 65 y.o. male with history of atrial flutter newly diagnosed with atrial fibrillation in February not on anticoagulation, follows  with Dr. Rayann Heman in cardiology, presenting with Right-sided weakness, slurred speech, headache, vomiting. Last known well when he went to bed at 10 PM on 09/24/2020 and woke up at around 2 AM this morning and complained to his wife that he was having some discomfort in his head or ear. She was unable to understand him well and thought his speech was slurred. EMS was called. He was very drowsy and sleepy upon their assessment and was weaker on the right side in comparison to the left. His vitals were not too abnormal-systolic blood pressures were in the 140- 150 range on scene.   He is currently intubated and sedated. Will get MRI and MR Venogram. Based on these results may get Cerebral Angiogram. Will continue with intubation for now until decision is made with Cerebral Angiogram. Will continue to monitor.   Left Dorsal Pons Intraparenchymal Hemorrhage source unknown  Code Stroke CT head - Acute intraparenchymal hemorrhage in the dorsal left pons with subarachnoid extension into the cerebral aqueduct and fourth ventricle.  CTA head & neck - No vascular malformation, anomaly or aneurysm. No emergent large vessel occlusion or high-grade stenosis of the intracranial arteries. Unchanged size of dorsal left pontine hemorrhage with intraventricular extension.  MRI - Ordered  MR Venogram - Ordered  2D Echo - Ordered  LDL No results found for  requested labs within last 26280 hours.  HgbA1c 5.7  VTE prophylaxis - None    Diet   Diet NPO time specified     No antithrombotic prior to admission, now on No antithrombotic at this time due to acute hemorrhage.   Therapy recommendations:  Pending  Disposition:  Pending  Hypertension  Home meds:  Diltiazem  Stable . Long-term BP goal normotensive  Hyperlipidemia  Home meds:  none  LDL No results found for requested labs within last 26280 hours., goal < 70  Add statin tomorrow after hemorrhage is stable  High intensity statin  Atorvastatin  Continue statin at discharge  Cerebral Edema  Currently on 3% NaCl at 75 cc/hr  Na goal is 150-160  1328 Na - 142  Other Stroke Risk Factors  Advanced Age >/= 35    Hospital day # 0  Fatima Sanger MD Resident I have personally obtained history,examined this patient, reviewed notes, independently viewed imaging studies, participated in medical decision making and plan of care.ROS completed by me personally and pertinent positives fully documented  I have made any additions or clarifications directly to the above note. Agree with note above.  Patient presented with dorsal midbrain intracerebral hemorrhage of unknown etiology.  He has no history of hypertension, urine drug screen is negative and he was not on any anticoagulants even though he has atrial flutter which was ablated and has remained in sinus rhythm since then.  CT scan and CT angiogram showed no large vessel stenosis or occlusion  Or AVM and no significant hydrocephalus.  Plan continue close neurological monitoring and strict blood pressure control with systolic goal less than 712 for first 24 hours and then below 160.  Check MRI scan of the brain with MR venogram.  May consider doing diagnostic cerebral catheter angiogram in a few days after medical stability to look for abnormal vein of vessels.  Long discussion with patient's wife at the bedside as well as subsequently met with his 2 daughters as well and answered questions.  Discussed with Dr. Vaughan Browner critical care medicine. This patient is critically ill and at significant risk of neurological worsening, death and care requires constant monitoring of vital signs, hemodynamics,respiratory and cardiac monitoring, extensive review of multiple databases, frequent neurological assessment, discussion with family, other specialists and medical decision making of high complexity.I have made any additions or clarifications directly to the above note.This critical care time  does not reflect procedure time, or teaching time or supervisory time of PA/NP/Med Resident etc but could involve care discussion time.  I spent 50 minutes of neurocritical care time  in the care of  this patient.      Antony Contras, MD Medical Director [facility] Pager: [phone] 09/25/2020 4:14 PM   To contact Stroke Continuity provider, please refer to [email or web address] After hours, contact General Neurology

## 2020-09-25 NOTE — Progress Notes (Signed)
OT Cancellation Note  Patient Details Name: Walter [name] MRN: [id number] DOB: [birth year]   Cancelled Treatment:    Reason Eval/Treat Not Completed: Active bedrest order;Medical issues which prohibited therapy (Intubated, sedated, and bedrest order. Will return as schedule allows.)  Westin Knotts M Happy Begeman Braylea Brancato MSOT, OTR/L Acute Rehab Pager: [phone] Office: [phone] 09/25/2020, 6:54 AM

## 2020-09-25 NOTE — ED Triage Notes (Signed)
Patient arrived with EMS from home LSN 2200 last night , family reported slurred speech with right side weakness at 0200 this morning , evaluated by EDP and neurologist at arrival and transported to CT scan .

## 2020-09-25 NOTE — Progress Notes (Signed)
Patient transported to MRI & back on ventilator with no problems.

## 2020-09-25 NOTE — Code Documentation (Signed)
Stroke Response Nurse Documentation Code Documentation  Walter [name] is a 65 y.o. male arriving to [facility] ED via [facility] on 3/28 with past medical hx of Aflutter, syncope . Code stroke was activated by EMS. Patient from home where he was LKW at 2200 and now complaining of right sided weakness, aphasia . On No antithrombotic. Stroke team at the bedside on patient arrival. Labs drawn and patient cleared for CT by Dr. Bebe Shaggy. Patient to CT with team. NIHSS 22, see documentation for details and code stroke times. Patient with decreased LOC, disoriented, not following commands, bilateral arm weakness, bilateral leg weakness, right decreased sensation, Global aphasia  and dysarthria  on exam. The following imaging was completed:  CTA head and neck. Patient is not a candidate for tPA due to hemorrhage. Bedside handoff with ED RN Reita Cliche.    Rose Fillers  Rapid Response RN

## 2020-09-25 NOTE — Progress Notes (Signed)
NAME:  Walter [name], MRN:  [id number], DOB:  1956-03-26, LOS: 0 ADMISSION DATE:  09/25/2020, CONSULTATION DATE:  09/25/20 REFERRING MD:  Wilford Corner,  CHIEF COMPLAINT:  Headache, r side weakness, slurred speech  History of Present Illness:  Walter [name] is a 65 yo man with hx of afib not on anticoagulation admitted with right-sided weakness, hemorrhagic stroke.  Emergently intubated in the ED for airway protection  Pertinent  Medical History  Aflutter/afib (not on ac)  S/p Ablation 03/2020, hx of Cardioversion 2015 Hx retinal detachment Covid infection in July 2021 treated with monoclonal antibody Hx CV  Meds: vitamin C, Vit D, vit E, probiotic, Diltiazem 120 daily, epinephrine prn for anaphylaxis   Significant Hospital Events: Including procedures, antibiotic start and stop dates in addition to other pertinent events   . 3/28-admit, intubated.  Not a candidate for TPA due to intracranial hemorrhage.  Started 3% saline  Interim History / Subjective:   Remains on the ventilator  Objective   Blood pressure 123/73, pulse 63, temperature 98.6 F (37 C), resp. rate 20, height 6\' 2"  (1.88 m), weight 101 kg, SpO2 100 %.    Vent Mode: PRVC FiO2 (%):  [100 %] 100 % Set Rate:  [16 bmp-20 bmp] 20 bmp Vt Set:  [650 mL] 650 mL PEEP:  [6 cmH20] 6 cmH20 Plateau Pressure:  [16 cmH20] 16 cmH20   Intake/Output Summary (Last 24 hours) at 09/25/2020 0911 Last data filed at 09/25/2020 0700 Gross per 24 hour  Intake 776.98 ml  Output --  Net 776.98 ml   Filed Weights   09/25/20 0334 09/25/20 0524  Weight: 107.7 kg 101 kg    Examination: Gen:      No acute distress HEENT:  EOMI, sclera anicteric Neck:     No masses; no thyromegaly, ETT Lungs:    Clear to auscultation bilaterally; normal respiratory effort CV:         Regular rate and rhythm; no murmurs Abd:      + bowel sounds; soft, non-tender; no palpable masses, no distension Ext:    No edema; adequate peripheral perfusion Skin:      Warm  and dry; no rash Neuro: Sedated   Labs/imaging that I havepersonally reviewed  (right click and "Reselect all SmartList Selections" daily)    Glucose 170, CBC stable Post intubation chest x-ray with ET tube in stable position, no acute abnormality  Resolved Hospital Problem list     Assessment & Plan:  Acute hemorrhagic stroke CT brain 3/28 with intraparenchymal hemorrhage with local mass-effect on brainstem High risk for developing hydrocephalus On 3% saline for edema. Management per stroke team Continue neuro monitoring MRI today  Acute respiratory failure due to stroke Intubated for airway protection Continue mechanical ventilation at current setting Intermittent chest x-ray and ABG  Atrial fibrillation, not on anticoagulation as an outpatient Rate control  Best practice (right click and "Reselect all SmartList Selections" daily)  Diet:  NPO Pain/Anxiety/Delirium protocol (if indicated): Yes (RASS goal -1) VAP protocol (if indicated): Yes DVT prophylaxis: SCD, heparin contraindicated due to intracranial head GI prophylaxis: PPI Glucose control:  SSI Yes Central venous access:  N/A Arterial line:  N/A Foley:  N/A Mobility:  bed rest  PT consulted: N/A Last date of multidisciplinary goals of care discussion []  Code Status:  full code Disposition: ICU  Critical care time:    The patient is critically ill with multiple organ system failure and requires high complexity decision making for assessment and support, frequent  evaluation and titration of therapies, advanced monitoring, review of radiographic studies and interpretation of complex data.   Critical Care Time devoted to patient care services, exclusive of separately billable procedures, described in this note is 35 minutes.   Chilton Greathouse MD [facility] See Amion for pager  If no response to pager , please call [phone] until 7pm After 7:00 pm call Elink   [phone] 09/25/2020, 9:11 AM

## 2020-09-25 NOTE — Consult Note (Signed)
Reason for Consult: Midbrain hemorrhage possible hydrocephalus Referring Physician: June Leap  Walter [name] is an 65 y.o. male.  HPI: Patient is a 65 year old gentleman who developed right-sided weakness slurred speech and vomiting early this morning.  CT scan shows the presence of hemorrhage extending into the midbrain with a slight amount of intraventricular hemorrhage in the fourth ventricle.  A subsequent MRI of the brain demonstrates there may be some slight enlargement of the ventricles.  Dr. Pearlean Brownie contacted me to be aware of the patient the potential that he might have for developing hydrocephalus.  Past Medical History:  Diagnosis Date  . Atrial flutter (HCC)   . Dizzy 09/25/13  . Near syncope 09/25/13    Past Surgical History:  Procedure Laterality Date  . A-FLUTTER ABLATION N/A 03/09/2020   Procedure: A-FLUTTER ABLATION;  Surgeon: Hillis Range, MD;  Location: [facility] INVASIVE CV LAB;  Service: Cardiovascular;  Laterality: N/A;  . CARDIOVERSION N/A 09/27/2013   Procedure: CARDIOVERSION;  Surgeon: Lewayne Bunting, MD;  Location: [facility] ENDOSCOPY;  Service: Cardiovascular;  Laterality: N/A;  . RETINAL DETACHMENT SURGERY    . right knee arthroscopy    . TEE WITHOUT CARDIOVERSION N/A 09/27/2013   Procedure: TRANSESOPHAGEAL ECHOCARDIOGRAM (TEE);  Surgeon: Lewayne Bunting, MD;  Location: [facility] ENDOSCOPY;  Service: Cardiovascular;  Laterality: N/A;  . WRIST SURGERY      Family History  Problem Relation Age of Onset  . Heart disease Mother   . Heart disease Father   . Healthy Sister   . Healthy Brother     Social History:  reports that he has never smoked. He has never used smokeless tobacco. He reports current alcohol use. He reports that he does not use drugs.  Allergies:  Allergies  Allergen Reactions  . Latex Itching    Skin turns real red    Medications: I have reviewed the patient's current medications.  Results for orders placed or performed during the hospital  encounter of 09/25/20 (from the past 48 hour(s))  CBG monitoring, ED     Status: Abnormal   Collection Time: 09/25/20  3:00 AM  Result Value Ref Range   Glucose-Capillary 166 (H) 70 - 99 mg/dL    Comment: Glucose reference range applies only to samples taken after fasting for at least 8 hours.  Urine rapid drug screen (hosp performed)     Status: None   Collection Time: 09/25/20  3:04 AM  Result Value Ref Range   Opiates NONE DETECTED NONE DETECTED   Cocaine NONE DETECTED NONE DETECTED   Benzodiazepines NONE DETECTED NONE DETECTED   Amphetamines NONE DETECTED NONE DETECTED   Tetrahydrocannabinol NONE DETECTED NONE DETECTED   Barbiturates NONE DETECTED NONE DETECTED    Comment: (NOTE) DRUG SCREEN FOR MEDICAL PURPOSES ONLY.  IF CONFIRMATION IS NEEDED FOR ANY PURPOSE, NOTIFY LAB WITHIN 5 DAYS.  LOWEST DETECTABLE LIMITS FOR URINE DRUG SCREEN Drug Class                     Cutoff (ng/mL) Amphetamine and metabolites    1000 Barbiturate and metabolites    200 Benzodiazepine                 200 Tricyclics and metabolites     300 Opiates and metabolites        300 Cocaine and metabolites        300 THC  50 Performed at [facility] Lab, [address] [place], [place] [address]   Urinalysis, Routine w reflex microscopic Urine, Clean Catch     Status: Abnormal   Collection Time: 09/25/20  3:04 AM  Result Value Ref Range   Color, Urine STRAW (A) YELLOW   APPearance CLEAR CLEAR   Specific Gravity, Urine 1.024 1.005 - 1.030   pH 7.0 5.0 - 8.0   Glucose, UA 50 (A) NEGATIVE mg/dL   Hgb urine dipstick NEGATIVE NEGATIVE   Bilirubin Urine NEGATIVE NEGATIVE   Ketones, ur NEGATIVE NEGATIVE mg/dL   Protein, ur NEGATIVE NEGATIVE mg/dL   Nitrite NEGATIVE NEGATIVE   Leukocytes,Ua NEGATIVE NEGATIVE    Comment: Performed at [facility], [address], [place], [place] [address]  Ethanol     Status: None   Collection Time: 09/25/20  3:09 AM  Result Value  Ref Range   Alcohol, Ethyl (B) <10 <10 mg/dL    Comment: (NOTE) Lowest detectable limit for serum alcohol is 10 mg/dL.  For medical purposes only. Performed at [facility], [address] [place], [place] [address]   Protime-INR     Status: None   Collection Time: 09/25/20  3:09 AM  Result Value Ref Range   Prothrombin Time 12.6 11.4 - 15.2 seconds   INR 1.0 0.8 - 1.2    Comment: (NOTE) INR goal varies based on device and disease states. Performed at [facility], [address], [place], [place] [address]   APTT     Status: None   Collection Time: 09/25/20  3:09 AM  Result Value Ref Range   aPTT 27 24 - 36 seconds    Comment: Performed at [facility], [address] [place], [place] [address]  CBC     Status: None   Collection Time: 09/25/20  3:09 AM  Result Value Ref Range   WBC 6.7 4.0 - 10.5 K/uL   RBC 4.89 4.22 - 5.81 MIL/uL   Hemoglobin 15.3 13.0 - 17.0 g/dL   HCT 86.7 67.2 - 09.4 %   MCV 93.5 80.0 - 100.0 fL   MCH 31.3 26.0 - 34.0 pg   MCHC 33.5 30.0 - 36.0 g/dL   RDW 70.9 62.8 - 36.6 %   Platelets 214 150 - 400 K/uL   nRBC 0.0 0.0 - 0.2 %    Comment: Performed at [facility] Lab, [address] [place], [place] [address]  Differential     Status: None   Collection Time: 09/25/20  3:09 AM  Result Value Ref Range   Neutrophils Relative % 32 %   Neutro Abs 2.2 1.7 - 7.7 K/uL   Lymphocytes Relative 54 %   Lymphs Abs 3.7 0.7 - 4.0 K/uL   Monocytes Relative 10 %   Monocytes Absolute 0.6 0.1 - 1.0 K/uL   Eosinophils Relative 3 %   Eosinophils Absolute 0.2 0.0 - 0.5 K/uL   Basophils Relative 1 %   Basophils Absolute 0.1 0.0 - 0.1 K/uL   Immature Granulocytes 0 %   Abs Immature Granulocytes 0.01 0.00 - 0.07 K/uL    Comment: Performed at [facility], [address] [place], [place] [address]  Comprehensive metabolic panel     Status: Abnormal   Collection Time: 09/25/20  3:09 AM  Result Value Ref Range   Sodium 137 135 - 145  mmol/L   Potassium 3.6 3.5 - 5.1 mmol/L   Chloride 104 98 -  111 mmol/L   CO2 25 22 - 32 mmol/L   Glucose, Bld 169 (H) 70 - 99 mg/dL    Comment: Glucose reference range applies only to samples taken after fasting for at least 8 hours.   BUN 15 8 - 23 mg/dL   Creatinine, Ser 9.60 0.61 - 1.24 mg/dL   Calcium 8.8 (L) 8.9 - 10.3 mg/dL   Total Protein 6.1 (L) 6.5 - 8.1 g/dL   Albumin 3.7 3.5 - 5.0 g/dL   AST 15 15 - 41 U/L   ALT 14 0 - 44 U/L   Alkaline Phosphatase 51 38 - 126 U/L   Total Bilirubin 0.9 0.3 - 1.2 mg/dL   GFR, Estimated >45 >40 mL/min    Comment: (NOTE) Calculated using the CKD-EPI Creatinine Equation (2021)    Anion gap 8 5 - 15    Comment: Performed at [facility] Lab, [address] [place], [place] [address]  Hemoglobin A1c     Status: Abnormal   Collection Time: 09/25/20  3:09 AM  Result Value Ref Range   Hgb A1c MFr Bld 5.7 (H) 4.8 - 5.6 %    Comment: (NOTE) Pre diabetes:          5.7%-6.4%  Diabetes:              >6.4%  Glycemic control for   <7.0% adults with diabetes    Mean Plasma Glucose 116.89 mg/dL    Comment: Performed at [facility], [address], [place], [place] [address]  I-stat chem 8, ED     Status: Abnormal   Collection Time: 09/25/20  3:28 AM  Result Value Ref Range   Sodium 140 135 - 145 mmol/L   Potassium 3.6 3.5 - 5.1 mmol/L   Chloride 102 98 - 111 mmol/L   BUN 18 8 - 23 mg/dL   Creatinine, Ser 9.56 0.61 - 1.24 mg/dL   Glucose, Bld 213 (H) 70 - 99 mg/dL    Comment: Glucose reference range applies only to samples taken after fasting for at least 8 hours.   Calcium, Ion 1.23 1.15 - 1.40 mmol/L   TCO2 25 22 - 32 mmol/L   Hemoglobin 15.6 13.0 - 17.0 g/dL   HCT 08.6 57.8 - 46.9 %  Resp Panel by RT-PCR (Flu A&B, Covid) Nasopharyngeal Swab     Status: None   Collection Time: 09/25/20  3:37 AM   Specimen: Nasopharyngeal Swab; Nasopharyngeal(NP) swabs in vial transport medium  Result Value Ref Range   SARS Coronavirus 2 by RT  PCR NEGATIVE NEGATIVE    Comment: (NOTE) SARS-CoV-2 target nucleic acids are NOT DETECTED.  The SARS-CoV-2 RNA is generally detectable in upper respiratory specimens during the acute phase of infection. The lowest concentration of SARS-CoV-2 viral copies this assay can detect is 138 copies/mL. A negative result does not preclude SARS-Cov-2 infection and should not be used as the sole basis for treatment or other patient management decisions. A negative result may occur with  improper specimen collection/handling, submission of specimen other than nasopharyngeal swab, presence of viral mutation(s) within the areas targeted by this assay, and inadequate number of viral copies(<138 copies/mL). A negative result must be combined with clinical observations, patient history, and epidemiological information. The expected result is Negative.  Fact Sheet for Patients:  BloggerCourse.com  Fact Sheet for Healthcare Providers:  SeriousBroker.it  This test is no t yet approved or cleared by the [place] FDA and  has been authorized for detection  and/or diagnosis of SARS-CoV-2 by FDA under an Emergency Use Authorization (EUA). This EUA will remain  in effect (meaning this test can be used) for the duration of the COVID-19 declaration under Section 564(b)(1) of the Act, 21 U.S.C.section 360bbb-3(b)(1), unless the authorization is terminated  or revoked sooner.       Influenza A by PCR NEGATIVE NEGATIVE   Influenza B by PCR NEGATIVE NEGATIVE    Comment: (NOTE) The Xpert Xpress SARS-CoV-2/FLU/RSV plus assay is intended as an aid in the diagnosis of influenza from Nasopharyngeal swab specimens and should not be used as a sole basis for treatment. Nasal washings and aspirates are unacceptable for Xpert Xpress SARS-CoV-2/FLU/RSV testing.  Fact Sheet for Patients: BloggerCourse.com  Fact Sheet for Healthcare  Providers: SeriousBroker.it  This test is not yet approved or cleared by the [place] FDA and has been authorized for detection and/or diagnosis of SARS-CoV-2 by FDA under an Emergency Use Authorization (EUA). This EUA will remain in effect (meaning this test can be used) for the duration of the COVID-19 declaration under Section 564(b)(1) of the Act, 21 U.S.C. section 360bbb-3(b)(1), unless the authorization is terminated or revoked.  Performed at [facility], [address], [place], [place] [address]   I-Stat arterial blood gas, ED     Status: Abnormal   Collection Time: 09/25/20  5:01 AM  Result Value Ref Range   pH, Arterial 7.316 (L) 7.350 - 7.450   pCO2 arterial 50.6 (H) 32.0 - 48.0 mmHg   pO2, Arterial 142 (H) 83.0 - 108.0 mmHg   Bicarbonate 25.8 20.0 - 28.0 mmol/L   TCO2 27 22 - 32 mmol/L   O2 Saturation 99.0 %   Acid-base deficit 1.0 0.0 - 2.0 mmol/L   Sodium 139 135 - 145 mmol/L   Potassium 4.2 3.5 - 5.1 mmol/L   Calcium, Ion 1.25 1.15 - 1.40 mmol/L   HCT 44.0 39.0 - 52.0 %   Hemoglobin 15.0 13.0 - 17.0 g/dL   Collection site Radial    Drawn by RT    Sample type ARTERIAL   MRSA PCR Screening     Status: None   Collection Time: 09/25/20  5:18 AM   Specimen: Nasal Mucosa; Nasopharyngeal  Result Value Ref Range   MRSA by PCR NEGATIVE NEGATIVE    Comment:        The GeneXpert MRSA Assay (FDA approved for NASAL specimens only), is one component of a comprehensive MRSA colonization surveillance program. It is not intended to diagnose MRSA infection nor to guide or monitor treatment for MRSA infections. Performed at [facility], [address], [place], [place] [address]   Sodium     Status: None   Collection Time: 09/25/20  6:15 AM  Result Value Ref Range   Sodium 137 135 - 145 mmol/L    Comment: Performed at [facility], [address], [place], [place] [address]  Glucose, capillary     Status: Abnormal    Collection Time: 09/25/20  7:48 AM  Result Value Ref Range   Glucose-Capillary 125 (H) 70 - 99 mg/dL    Comment: Glucose reference range applies only to samples taken after fasting for at least 8 hours.  Glucose, capillary     Status: Abnormal   Collection Time: 09/25/20 11:42 AM  Result Value Ref Range   Glucose-Capillary 110 (H) 70 - 99 mg/dL    Comment: Glucose reference range applies only to samples taken after fasting for at least  8 hours.  Sodium     Status: None   Collection Time: 09/25/20 12:01 PM  Result Value Ref Range   Sodium 142 135 - 145 mmol/L    Comment: Performed at [facility] Lab, [address], [place], [place] [address]  Glucose, capillary     Status: Abnormal   Collection Time: 09/25/20  4:27 PM  Result Value Ref Range   Glucose-Capillary 116 (H) 70 - 99 mg/dL    Comment: Glucose reference range applies only to samples taken after fasting for at least 8 hours.  Sodium     Status: None   Collection Time: 09/25/20  4:59 PM  Result Value Ref Range   Sodium 142 135 - 145 mmol/L    Comment: Performed at [facility], [address] [place], [place] [address]  Lipid panel     Status: None   Collection Time: 09/25/20  4:59 PM  Result Value Ref Range   Cholesterol 155 0 - 200 mg/dL   Triglycerides 829 <562 mg/dL   HDL 44 >13 mg/dL   Total CHOL/HDL Ratio 3.5 RATIO   VLDL 23 0 - 40 mg/dL   LDL Cholesterol 88 0 - 99 mg/dL    Comment:        Total Cholesterol/HDL:CHD Risk Coronary Heart Disease Risk Table                     Men   Women  1/2 Average Risk   3.4   3.3  Average Risk       5.0   4.4  2 X Average Risk   9.6   7.1  3 X Average Risk  23.4   11.0        Use the calculated Patient Ratio above and the CHD Risk Table to determine the patient's CHD Risk.        ATP III CLASSIFICATION (LDL):  <100     mg/dL   Optimal  086-578  mg/dL   Near or Above                    Optimal  130-159  mg/dL   Borderline  469-629  mg/dL   High  >528      mg/dL   Very High Performed at [facility], [address], [place], [place] [address]     CT ANGIO HEAD W OR WO CONTRAST  Result Date: 09/25/2020 CLINICAL DATA:  Right-sided weakness EXAM: CT ANGIOGRAPHY HEAD AND NECK TECHNIQUE: Multidetector CT imaging of the head and neck was performed using the standard protocol during bolus administration of intravenous contrast. Multiplanar CT image reconstructions and MIPs were obtained to evaluate the vascular anatomy. Carotid stenosis measurements (when applicable) are obtained utilizing NASCET criteria, using the distal internal carotid diameter as the denominator. CONTRAST:  75mL OMNIPAQUE IOHEXOL 350 MG/ML SOLN COMPARISON:  None. FINDINGS: CTA NECK FINDINGS SKELETON: There is no bony spinal canal stenosis. No lytic or blastic lesion. OTHER NECK: Normal pharynx, larynx and major salivary glands. No cervical lymphadenopathy. Unremarkable thyroid gland. UPPER CHEST: No pneumothorax or pleural effusion. No nodules or masses. AORTIC ARCH: There is no calcific atherosclerosis of the aortic arch. There is no aneurysm, dissection or hemodynamically significant stenosis of the visualized portion of the aorta. Conventional 3 vessel aortic branching pattern. The visualized proximal subclavian arteries are widely patent. RIGHT CAROTID SYSTEM: Normal without aneurysm, dissection or stenosis. LEFT CAROTID SYSTEM: Normal without aneurysm, dissection or  stenosis. VERTEBRAL ARTERIES: Left dominant configuration. Both origins are clearly patent. There is no dissection, occlusion or flow-limiting stenosis to the skull base (V1-V3 segments). CTA HEAD FINDINGS POSTERIOR CIRCULATION: --Vertebral arteries: Normal V4 segments. --Inferior cerebellar arteries: Normal. --Basilar artery: Normal. --Superior cerebellar arteries: Normal. --Posterior cerebral arteries (PCA): Normal. ANTERIOR CIRCULATION: --Intracranial internal carotid arteries: Normal. --Anterior cerebral arteries (ACA):  Normal. Both A1 segments are present. Patent anterior communicating artery (a-comm). --Middle cerebral arteries (MCA): Normal. VENOUS SINUSES: As permitted by contrast timing, patent. ANATOMIC VARIANTS: None Review of the MIP images confirms the above findings. 5 minute delay head CT shows unchanged size of dorsal left pontine hemorrhage with intraventricular extension. IMPRESSION: 1. No vascular malformation, anomaly or aneurysm. 2. No emergent large vessel occlusion or high-grade stenosis of the intracranial arteries. 3. Unchanged size of dorsal left pontine hemorrhage with intraventricular extension. These results were called by telephone at the time of interpretation on 09/25/2020 at 3:32 am to provider [facility] , who verbally acknowledged these results. Electronically Signed   By: Deatra RobinsonKevin  Herman M.D.   On: 09/25/2020 03:33   CT ANGIO NECK W OR WO CONTRAST  Result Date: 09/25/2020 CLINICAL DATA:  Right-sided weakness EXAM: CT ANGIOGRAPHY HEAD AND NECK TECHNIQUE: Multidetector CT imaging of the head and neck was performed using the standard protocol during bolus administration of intravenous contrast. Multiplanar CT image reconstructions and MIPs were obtained to evaluate the vascular anatomy. Carotid stenosis measurements (when applicable) are obtained utilizing NASCET criteria, using the distal internal carotid diameter as the denominator. CONTRAST:  75mL OMNIPAQUE IOHEXOL 350 MG/ML SOLN COMPARISON:  None. FINDINGS: CTA NECK FINDINGS SKELETON: There is no bony spinal canal stenosis. No lytic or blastic lesion. OTHER NECK: Normal pharynx, larynx and major salivary glands. No cervical lymphadenopathy. Unremarkable thyroid gland. UPPER CHEST: No pneumothorax or pleural effusion. No nodules or masses. AORTIC ARCH: There is no calcific atherosclerosis of the aortic arch. There is no aneurysm, dissection or hemodynamically significant stenosis of the visualized portion of the aorta. Conventional 3 vessel aortic  branching pattern. The visualized proximal subclavian arteries are widely patent. RIGHT CAROTID SYSTEM: Normal without aneurysm, dissection or stenosis. LEFT CAROTID SYSTEM: Normal without aneurysm, dissection or stenosis. VERTEBRAL ARTERIES: Left dominant configuration. Both origins are clearly patent. There is no dissection, occlusion or flow-limiting stenosis to the skull base (V1-V3 segments). CTA HEAD FINDINGS POSTERIOR CIRCULATION: --Vertebral arteries: Normal V4 segments. --Inferior cerebellar arteries: Normal. --Basilar artery: Normal. --Superior cerebellar arteries: Normal. --Posterior cerebral arteries (PCA): Normal. ANTERIOR CIRCULATION: --Intracranial internal carotid arteries: Normal. --Anterior cerebral arteries (ACA): Normal. Both A1 segments are present. Patent anterior communicating artery (a-comm). --Middle cerebral arteries (MCA): Normal. VENOUS SINUSES: As permitted by contrast timing, patent. ANATOMIC VARIANTS: None Review of the MIP images confirms the above findings. 5 minute delay head CT shows unchanged size of dorsal left pontine hemorrhage with intraventricular extension. IMPRESSION: 1. No vascular malformation, anomaly or aneurysm. 2. No emergent large vessel occlusion or high-grade stenosis of the intracranial arteries. 3. Unchanged size of dorsal left pontine hemorrhage with intraventricular extension. These results were called by telephone at the time of interpretation on 09/25/2020 at 3:32 am to [facility] , who verbally acknowledged these results. Electronically Signed   By: Deatra RobinsonKevin  Herman M.D.   On: 09/25/2020 03:33   MR BRAIN W WO CONTRAST  Result Date: 09/25/2020 CLINICAL DATA:  Intracranial hemorrhage EXAM: MRI HEAD WITHOUT AND WITH CONTRAST MRV HEAD WITHOUT AND WITH CONTRAST TECHNIQUE: Multiplanar, multiecho pulse sequences of the brain  and surrounding structures were obtained without and with intravenous contrast. Angiographic images of the intracranial venous  structures were obtained using MRV technique without and with intravenous contrast. COMPARISON:  None. CONTRAST:  33mL GADAVIST GADOBUTROL 1 MMOL/ML IV SOLN FINDINGS: MR HEAD FINDINGS Brain: As seen on CT imaging, there is an approximately 1.4 x 0.9 x 1.8 cm area of hemorrhage centered within the left dorsal aspect of the midbrain extending into the dorsal pons and inframedial thalamus with mild surrounding edema. Hemorrhage appears acute by signal characteristics. Intraventricular extension is noted by the cerebral aqueduct and the fourth ventricle. There is no abnormal enhancement. Compression of the cerebral aqueduct with mild prominence of third and lateral ventricles suggesting a degree of obstructive hydrocephalus. Vascular: Major vessel flow voids at the skull base are preserved. Skull and upper cervical spine: Normal marrow signal is preserved. Sinuses/Orbits: Mild mucosal thickening a. Orbits are unremarkable. Other: Sella is unremarkable.  Mastoid air cells are clear. MRV HEAD FINDINGS Superior sagittal sinus, straight sinus, vein of Galen, and internal cerebral veins are patent. There is attenuation of the left basal vein in the quadrigeminal cistern without definite occlusion. Similar appearance on prior CTA. Both transverse and sigmoid sinuses are patent. IMPRESSION: Acute hemorrhage centered within the left dorsal midbrain extending into the dorsal pons and inferomedial thalamus with intraventricular extension. No abnormal enhancement to suggest underlying lesion. Compression of the cerebral aqueduct with possible mild obstructive hydrocephalus. No evidence of dural sinus thrombosis. Attenuation of the left basal vein in the quadrigeminal cistern without definite occlusion. Electronically Signed   By: Guadlupe Spanish M.D.   On: 09/25/2020 16:43   MR Venogram Head  Result Date: 09/25/2020 CLINICAL DATA:  Intracranial hemorrhage EXAM: MRI HEAD WITHOUT AND WITH CONTRAST MRV HEAD WITHOUT AND WITH  CONTRAST TECHNIQUE: Multiplanar, multiecho pulse sequences of the brain and surrounding structures were obtained without and with intravenous contrast. Angiographic images of the intracranial venous structures were obtained using MRV technique without and with intravenous contrast. COMPARISON:  None. CONTRAST:  20mL GADAVIST GADOBUTROL 1 MMOL/ML IV SOLN FINDINGS: MR HEAD FINDINGS Brain: As seen on CT imaging, there is an approximately 1.4 x 0.9 x 1.8 cm area of hemorrhage centered within the left dorsal aspect of the midbrain extending into the dorsal pons and inframedial thalamus with mild surrounding edema. Hemorrhage appears acute by signal characteristics. Intraventricular extension is noted by the cerebral aqueduct and the fourth ventricle. There is no abnormal enhancement. Compression of the cerebral aqueduct with mild prominence of third and lateral ventricles suggesting a degree of obstructive hydrocephalus. Vascular: Major vessel flow voids at the skull base are preserved. Skull and upper cervical spine: Normal marrow signal is preserved. Sinuses/Orbits: Mild mucosal thickening a. Orbits are unremarkable. Other: Sella is unremarkable.  Mastoid air cells are clear. MRV HEAD FINDINGS Superior sagittal sinus, straight sinus, vein of Galen, and internal cerebral veins are patent. There is attenuation of the left basal vein in the quadrigeminal cistern without definite occlusion. Similar appearance on prior CTA. Both transverse and sigmoid sinuses are patent. IMPRESSION: Acute hemorrhage centered within the left dorsal midbrain extending into the dorsal pons and inferomedial thalamus with intraventricular extension. No abnormal enhancement to suggest underlying lesion. Compression of the cerebral aqueduct with possible mild obstructive hydrocephalus. No evidence of dural sinus thrombosis. Attenuation of the left basal vein in the quadrigeminal cistern without definite occlusion. Electronically Signed   By:  Guadlupe Spanish M.D.   On: 09/25/2020 16:43   DG Chest Port 1  View  Result Date: 09/25/2020 CLINICAL DATA:  Status post intubation EXAM: PORTABLE CHEST 1 VIEW COMPARISON:  09/25/2013 FINDINGS: Cardiac shadow is enlarged but stable. Endotracheal tube and gastric catheter are noted in satisfactory position. The overall inspiratory effort is poor. No bony abnormality is noted. IMPRESSION: Tubes and lines as described above.  No acute abnormality noted. Electronically Signed   By: Alcide Clever M.D.   On: 09/25/2020 03:49   ECHOCARDIOGRAM COMPLETE  Result Date: 09/25/2020    ECHOCARDIOGRAM REPORT   Patient Name:   ABDURRAHMAN PETERSHEIM Date of Exam: 09/25/2020 Medical Rec #:  [id number]       Height:       74.0 in Accession #:    [id number]      Weight:       222.7 lb Date of Birth:  03-Jul-1955       BSA:          2.275 m Patient Age:    65 years        BP:           123/73 mmHg Patient Gender: M               HR:           71 bpm. Exam Location:  Inpatient Procedure: 2D Echo, Cardiac Doppler and Color Doppler Indications:    CVA  History:        Patient has prior history of Echocardiogram examinations, most                 recent 09/06/2016. Arrythmias:Atrial Flutter.  Sonographer:    Neomia Dear RDCS Referring Phys: [id number] ASHISH ARORA IMPRESSIONS  1. Left ventricular ejection fraction, by estimation, is 55 to 60%. The left ventricle has normal function. The left ventricle has no regional wall motion abnormalities. Left ventricular diastolic parameters are consistent with Grade I diastolic dysfunction (impaired relaxation).  2. Right ventricular systolic function is normal. The right ventricular size is normal. There is normal pulmonary artery systolic pressure. The estimated right ventricular systolic pressure is 28.2 mmHg.  3. The mitral valve is normal in structure. No evidence of mitral valve regurgitation. No evidence of mitral stenosis.  4. The aortic valve is tricuspid. Aortic valve regurgitation is not  visualized. No aortic stenosis is present.  5. The inferior vena cava is dilated in size with <50% respiratory variability, suggesting right atrial pressure of 15 mmHg. FINDINGS  Left Ventricle: Left ventricular ejection fraction, by estimation, is 55 to 60%. The left ventricle has normal function. The left ventricle has no regional wall motion abnormalities. The left ventricular internal cavity size was normal in size. There is  no left ventricular hypertrophy. Left ventricular diastolic parameters are consistent with Grade I diastolic dysfunction (impaired relaxation). Right Ventricle: The right ventricular size is normal. No increase in right ventricular wall thickness. Right ventricular systolic function is normal. There is normal pulmonary artery systolic pressure. The tricuspid regurgitant velocity is 1.82 m/s, and  with an assumed right atrial pressure of 15 mmHg, the estimated right ventricular systolic pressure is 28.2 mmHg. Left Atrium: Left atrial size was normal in size. Right Atrium: Right atrial size was normal in size. Pericardium: There is no evidence of pericardial effusion. Mitral Valve: The mitral valve is normal in structure. No evidence of mitral valve regurgitation. No evidence of mitral valve stenosis. Tricuspid Valve: The tricuspid valve is normal in structure. Tricuspid valve regurgitation is trivial. Aortic Valve: The aortic valve is tricuspid. Aortic  valve regurgitation is not visualized. No aortic stenosis is present. Aortic valve mean gradient measures 4.0 mmHg. Aortic valve peak gradient measures 8.0 mmHg. Aortic valve area, by VTI measures 3.37 cm. Pulmonic Valve: The pulmonic valve was normal in structure. Pulmonic valve regurgitation is not visualized. Aorta: The aortic root is normal in size and structure. Venous: The inferior vena cava is dilated in size with less than 50% respiratory variability, suggesting right atrial pressure of 15 mmHg. IAS/Shunts: No atrial level shunt  detected by color flow Doppler.  LEFT VENTRICLE PLAX 2D LVIDd:         4.20 cm      Diastology LVIDs:         2.50 cm      LV e' medial:    8.70 cm/s LV PW:         1.30 cm      LV E/e' medial:  5.7 LV IVS:        0.90 cm      LV e' lateral:   14.90 cm/s LVOT diam:     2.40 cm      LV E/e' lateral: 3.3 LV SV:         82 LV SV Index:   36 LVOT Area:     4.52 cm  LV Volumes (MOD) LV vol d, MOD A2C: 53.1 ml LV vol d, MOD A4C: 108.6 ml LV vol s, MOD A2C: 34.1 ml LV vol s, MOD A4C: 71.3 ml LV SV MOD A2C:     19.0 ml LV SV MOD A4C:     108.6 ml LV SV MOD BP:      25.5 ml RIGHT VENTRICLE TAPSE (M-mode): 3.3 cm  PULMONARY VEINS                         A Reversal Duration: 103.00 msec                         A Reversal Velocity: 27.70 cm/s                         Diastolic Velocity:  30.90 cm/s                         S/D Velocity:        1.10                         Systolic Velocity:   34.10 cm/s LEFT ATRIUM           Index       RIGHT ATRIUM           Index LA diam:      3.50 cm 1.54 cm/m  RA Area:     17.45 cm LA Vol (A2C): 46.7 ml 20.53 ml/m RA Volume:   41.75 ml  18.35 ml/m LA Vol (A4C): 58.0 ml 25.50 ml/m  AORTIC VALVE                   PULMONIC VALVE AV Area (Vmax):    3.56 cm    PV Vmax:       0.85 m/s AV Area (Vmean):   3.62 cm    PV Vmean:      57.700 cm/s AV Area (VTI):     3.37 cm    PV VTI:  0.151 m AV Vmax:           141.00 cm/s PV Peak grad:  2.9 mmHg AV Vmean:          91.900 cm/s PV Mean grad:  2.0 mmHg AV VTI:            0.244 m AV Peak Grad:      8.0 mmHg AV Mean Grad:      4.0 mmHg LVOT Vmax:         111.00 cm/s LVOT Vmean:        73.600 cm/s LVOT VTI:          0.182 m LVOT/AV VTI ratio: 0.75  AORTA Ao Root diam: 3.60 cm Ao Asc diam:  3.30 cm MITRAL VALVE               TRICUSPID VALVE MV Area (PHT): 1.85 cm    TR Peak grad:   13.2 mmHg MV Decel Time: 409 msec    TR Vmax:        182.00 cm/s MV E velocity: 49.50 cm/s MV A velocity: 67.40 cm/s  SHUNTS MV E/A ratio:  0.73        Systemic VTI:   0.18 m                            Systemic Diam: 2.40 cm Marca Ancona MD Electronically signed by Marca Ancona MD Signature Date/Time: 09/25/2020/3:37:26 PM    Final    CT HEAD CODE STROKE WO CONTRAST  Result Date: 09/25/2020 CLINICAL DATA:  Code stroke.  Right-sided weakness EXAM: CT HEAD WITHOUT CONTRAST TECHNIQUE: Contiguous axial images were obtained from the base of the skull through the vertex without intravenous contrast. COMPARISON:  None. FINDINGS: Brain: Acute intraparenchymal hemorrhage in the dorsal left pons with subarachnoid extension into the cerebral aqueduct and fourth ventricle. No midline shift or other mass effect. No hydrocephalus. Vascular: No abnormal hyperdensity of the major intracranial arteries or dural venous sinuses. No intracranial atherosclerosis. Skull: The visualized skull base, calvarium and extracranial soft tissues are normal. Sinuses/Orbits: No fluid levels or advanced mucosal thickening of the visualized paranasal sinuses. No mastoid or middle ear effusion. The orbits are normal. IMPRESSION: Acute intraparenchymal hemorrhage in the dorsal left pons with subarachnoid extension into the cerebral aqueduct and fourth ventricle. Critical Value/emergent results were called by telephone at the time of interpretation on 09/25/2020 at 3:16 am to provider [facility], who verbally acknowledged these results. Electronically Signed   By: Deatra Robinson M.D.   On: 09/25/2020 03:16    Review of Systems  Unable to perform ROS: Acuity of condition   Blood pressure 121/66, pulse 66, temperature 99.5 F (37.5 C), temperature source Axillary, resp. rate 20, height 6\' 2"  (1.88 m), weight 101 kg, SpO2 100 %. Physical Exam Neurological:     Comments: Patient is intubated.  Pupils are 2 mm and reactive.  The gaze is midline with no roving eye movements.  Positive doll's maneuver is noted.  To painful stimulation errs good movement of the left upper extremity and only slight movement of  the right upper extremity.  There is no purposeful movement.  He will breathe slightly above the vent.     Assessment/Plan: Mid brain hemorrhage with slight intraventricular extension.  Patient has been somnolent and required intubation.  Plan we will observe the patient situation for the development of hydrocephalus if this should occur he may require a ventriculostomy  catheter.  For now simple observation.  Shary Key Magdiel Bartles 09/25/2020, 6:42 PM

## 2020-09-26 ENCOUNTER — Inpatient Hospital Stay (HOSPITAL_COMMUNITY): Payer: Medicare Other

## 2020-09-26 ENCOUNTER — Encounter (HOSPITAL_COMMUNITY): Payer: Self-pay | Admitting: Student in an Organized Health Care Education/Training Program

## 2020-09-26 DIAGNOSIS — I613 Nontraumatic intracerebral hemorrhage in brain stem: Secondary | ICD-10-CM | POA: Diagnosis not present

## 2020-09-26 HISTORY — PX: IR ANGIO EXTERNAL CAROTID SEL EXT CAROTID UNI R MOD SED: IMG5371

## 2020-09-26 HISTORY — PX: IR US GUIDE VASC ACCESS RIGHT: IMG2390

## 2020-09-26 HISTORY — PX: IR ANGIO INTRA EXTRACRAN SEL COM CAROTID INNOMINATE BILAT MOD SED: IMG5360

## 2020-09-26 HISTORY — PX: IR ANGIO VERTEBRAL SEL VERTEBRAL UNI R MOD SED: IMG5368

## 2020-09-26 LAB — GLUCOSE, CAPILLARY
Glucose-Capillary: 111 mg/dL — ABNORMAL HIGH (ref 70–99)
Glucose-Capillary: 114 mg/dL — ABNORMAL HIGH (ref 70–99)
Glucose-Capillary: 116 mg/dL — ABNORMAL HIGH (ref 70–99)
Glucose-Capillary: 121 mg/dL — ABNORMAL HIGH (ref 70–99)
Glucose-Capillary: 128 mg/dL — ABNORMAL HIGH (ref 70–99)
Glucose-Capillary: 158 mg/dL — ABNORMAL HIGH (ref 70–99)

## 2020-09-26 LAB — PHOSPHORUS: Phosphorus: 2 mg/dL — ABNORMAL LOW (ref 2.5–4.6)

## 2020-09-26 LAB — SODIUM
Sodium: 153 mmol/L — ABNORMAL HIGH (ref 135–145)
Sodium: 147 mmol/L — ABNORMAL HIGH (ref 135–145)
Sodium: 147 mmol/L — ABNORMAL HIGH (ref 135–145)
Sodium: 149 mmol/L — ABNORMAL HIGH (ref 135–145)

## 2020-09-26 LAB — MAGNESIUM: Magnesium: 1.9 mg/dL (ref 1.7–2.4)

## 2020-09-26 MED ORDER — VERAPAMIL HCL 2.5 MG/ML IV SOLN
INTRAVENOUS | Status: AC
  Filled 2020-09-26: qty 2

## 2020-09-26 MED ORDER — HEPARIN SODIUM (PORCINE) 1000 UNIT/ML IJ SOLN
INTRAMUSCULAR | Status: AC
  Filled 2020-09-26: qty 1

## 2020-09-26 MED ORDER — FENTANYL CITRATE (PF) 100 MCG/2ML IJ SOLN
INTRAMUSCULAR | Status: AC | PRN
  Administered 2020-09-26: 50 ug via INTRAVENOUS

## 2020-09-26 MED ORDER — IOHEXOL 240 MG/ML SOLN
150.0000 mL | Freq: Once | INTRAMUSCULAR | Status: AC | PRN
  Administered 2020-09-26: 60 mL via INTRAVENOUS

## 2020-09-26 MED ORDER — NITROGLYCERIN 1 MG/10 ML FOR IR/CATH LAB
INTRA_ARTERIAL | Status: AC
  Filled 2020-09-26: qty 10

## 2020-09-26 MED ORDER — IOHEXOL 240 MG/ML SOLN
INTRAMUSCULAR | Status: AC
  Filled 2020-09-26: qty 100

## 2020-09-26 MED ORDER — PROSOURCE TF PO LIQD
45.0000 mL | Freq: Two times a day (BID) | ORAL | Status: DC
  Administered 2020-09-26 – 2020-10-19 (×46): 45 mL
  Filled 2020-09-26 (×41): qty 45

## 2020-09-26 MED ORDER — IOHEXOL 240 MG/ML SOLN
150.0000 mL | Freq: Once | INTRAMUSCULAR | Status: AC | PRN
  Administered 2020-09-26: 16 mL via INTRAVENOUS

## 2020-09-26 MED ORDER — VERAPAMIL HCL 2.5 MG/ML IV SOLN: INTRA_ARTERIAL | Status: AC | PRN

## 2020-09-26 MED ORDER — FENTANYL CITRATE (PF) 100 MCG/2ML IJ SOLN
INTRAMUSCULAR | Status: AC
  Administered 2020-09-26: 25 ug via INTRAVENOUS
  Filled 2020-09-26: qty 2

## 2020-09-26 MED ORDER — LIDOCAINE HCL (PF) 1 % IJ SOLN
INTRAMUSCULAR | Status: AC | PRN
Start: 2020-09-26 — End: 2020-09-26
  Administered 2020-09-26: 1 mL

## 2020-09-26 MED ORDER — LIDOCAINE HCL 1 % IJ SOLN
INTRAMUSCULAR | Status: AC
  Filled 2020-09-26: qty 20

## 2020-09-26 MED ORDER — VITAL 1.5 CAL PO LIQD
1000.0000 mL | ORAL | Status: DC
  Administered 2020-09-26 – 2020-09-29 (×4): 1000 mL
  Filled 2020-09-26: qty 1000

## 2020-09-26 NOTE — Progress Notes (Signed)
Initial Nutrition Assessment  DOCUMENTATION CODES:   Not applicable  INTERVENTION:    Initiate tube feeding via OG tube: Vital 1.5 at 65 ml/h (1560 ml per day) Prosource TF 45 ml BID  Provides 2420 kcal, 127 gm protein, 1185 ml free water daily TF regimen and propofol at current rate providing 2736 total kcal/day (100 % of kcal needs)    NUTRITION DIAGNOSIS:   Inadequate oral intake related to inability to eat as evidenced by NPO status.  GOAL:   Patient will meet greater than or equal to 90% of their needs  MONITOR:   TF tolerance  REASON FOR ASSESSMENT:   Consult,Ventilator Enteral/tube feeding initiation and management  ASSESSMENT:   Pt with PMH of afib not on anticoagulation s/p ablation 9/21, s/p cardioversion 2015, retinal detachment s/p surgery, COVID s/p monoclonal antibody treatment 7/21, and CV admitted with right-sided weakness and ICH.     Per CT pt with intraparenchymal hemorrhage with local mass-effect on brainstem developing hydrocephalus.  Pt on 3% hypertonic saline therapy.  Plan for CTA and EVD today, currently out of his room. Unable to obtain nutrition and weight hx at this time.   Patient is currently intubated on ventilator support MV: 13 L/min Temp (24hrs), Avg:99.2 F (37.3 C), Min:98.7 F (37.1 C), Max:100.2 F (37.9 C) MAP: 83   Propofol: 12 ml/hr provides: 316 kcal  Cleviprex @ 2 ml/hr provides: 96 kcal  Medications reviewed and include: SSI, protonix, senokot-s  3% Hypertonic saline Labs reviewed: Na 147 (on hypertonic saline)  CBG's: 114-128  18 F OG tube (per xray in satisfactory position): 450 ml output    Diet Order:   Diet Order            Diet NPO time specified  Diet effective now                 EDUCATION NEEDS:   No education needs have been identified at this time  Skin:  Skin Assessment: Reviewed RN Assessment  Last BM:  3/27  Height:   Ht Readings from Last 1 Encounters:  09/25/20 6\' 2"  (1.88  m)    Weight:   Wt Readings from Last 1 Encounters:  09/25/20 101 kg    Ideal Body Weight:  86.3 kg  BMI:  Body mass index is 28.59 kg/m.  Estimated Nutritional Needs:   Kcal:  2400-2600  Protein:  115-130 grams  Fluid:  >2 L/day  09/27/20., RD, LDN, CNSC See AMiON for contact information

## 2020-09-26 NOTE — Progress Notes (Signed)
3cc removed from TR band

## 2020-09-26 NOTE — Procedures (Signed)
INTERVENTIONAL NEURORADIOLOGY BRIEF POSTPROCEDURE NOTE  Diagnostic cerebral angiogram   Attending: Dr. Baldemar Lenis   Assistant: None.   Diagnosis: Intracranial hemorrhage.   Access site: Right distal radial artery.   Access closure: Inflatable band.   Anesthesia: Continued sedation with propofol initiated in the ICU.   Additional medication used: 50 mcg Fentanyl IV.  Complications: None.   Estimated blood loss: Minimal.   Specimen: None.   Findings: Unremarkable cerebral angiogram.  Specifically, no evidence of an aneurysm, AVM, dural AV fistula or other vascular abnormality to explain patient's known intraparenchymal hemorrhage.   The patient tolerated the procedure well without incident or complication and is in stable condition.

## 2020-09-26 NOTE — Progress Notes (Signed)
Taken to IR, report given to [employer]

## 2020-09-26 NOTE — Progress Notes (Signed)
Return to room, TR band with 9 mL air, good distal Sat and cap refill.

## 2020-09-26 NOTE — Progress Notes (Signed)
NAME:  Walter [name], MRN:  [id number], DOB:  [birth year], LOS: 1 ADMISSION DATE:  09/25/2020, CONSULTATION DATE:  09/25/20 REFERRING MD:  Wilford Corner,  CHIEF COMPLAINT:  Headache, r side weakness, slurred speech  History of Present Illness:  Walter [name] is a 65 yo man with hx of afib not on anticoagulation admitted with right-sided weakness, hemorrhagic stroke.  Emergently intubated in the ED for airway protection  Pertinent  Medical History  Aflutter/afib (not on ac)  S/p Ablation 03/2020, hx of Cardioversion 2015 Hx retinal detachment Covid infection in July 2021 treated with monoclonal antibody Hx CV  Meds: vitamin C, Vit D, vit E, probiotic, Diltiazem 120 daily, epinephrine prn for anaphylaxis   Significant Hospital Events: Including procedures, antibiotic start and stop dates in addition to other pertinent events   . 3/28-admit, intubated.  Not a candidate for TPA due to intracranial hemorrhage.  Started 3% saline  Interim History / Subjective:   Remains on vent. Mental status is poor  Objective   Blood pressure (!) 149/71, pulse 68, temperature 98.7 F (37.1 C), temperature source Axillary, resp. rate 20, height 6\' 2"  (1.88 m), weight 101 kg, SpO2 100 %.    Vent Mode: PRVC FiO2 (%):  [40 %-50 %] 40 % Set Rate:  [20 bmp] 20 bmp Vt Set:  [650 mL] 650 mL PEEP:  [5 cmH20] 5 cmH20 Plateau Pressure:  [14 cmH20-15 cmH20] 15 cmH20   Intake/Output Summary (Last 24 hours) at 09/26/2020 09/28/2020 Last data filed at 09/26/2020 0600 Gross per 24 hour  Intake 1985.38 ml  Output 2250 ml  Net -264.62 ml   Filed Weights   09/25/20 0334 09/25/20 0524  Weight: 107.7 kg 101 kg    Examination: Gen:      No acute distress HEENT:  EOMI, sclera anicteric Neck:     No masses; no thyromegaly, ETT Lungs:    Clear to auscultation bilaterally; normal respiratory effort CV:         Regular rate and rhythm; no murmurs Abd:      + bowel sounds; soft, non-tender; no palpable masses, no  distension Ext:    No edema; adequate peripheral perfusion Skin:      Warm and dry; no rash Neuro: Unresponsive   Labs/imaging that I havepersonally reviewed  (right click and "Reselect all SmartList Selections" daily)  Sodium down to 147 MRI reviewed with redemonstration of acute hemorrhage, possible mild obstructive hydrocephalus  Resolved Hospital Problem list     Assessment & Plan:  Acute hemorrhagic stroke CT brain and MRI reviewed with intraparenchymal hemorrhage with local mass-effect on brainstem developing hydrocephalus For CTA today and EVD placement 3% saline adjusted to 60 cc/h  Acute respiratory failure due to stroke Intubated for airway protection Pressure support weans as tolerated.  Mental status is a barrier to extubation  Atrial fibrillation, not on anticoagulation as an outpatient Rate control  Best practice (right click and "Reselect all SmartList Selections" daily)   Best Practice (right click and "Reselect all SmartList Selections" daily)   Diet:  Tube Feed  If oral type of diet:  Pain/Anxiety/Delirium protocol (if indicated): Yes (RASS goal -1) VAP protocol (if indicated): Yes DVT prophylaxis: SCD GI prophylaxis: PPI Glucose control:  SSI Yes Central venous access:  N/A Arterial line:  N/A Foley:  N/A Restraints ACTION; IS/IS NOT: is not needed Mobility:  bed rest  PT consulted: N/A Studies pending: CTA, EVD placement Culture data pending: none Last reviewed culture data:today Antibiotics:None  Daily labs: not  indicated Code Status:  full code Prognosis: Serious Last date of multidisciplinary goals of care discussion []  Icu pulm disposition: Unchanged.   Critical care time:    The patient is critically ill with multiple organ system failure and requires high complexity decision making for assessment and support, frequent evaluation and titration of therapies, advanced monitoring, review of radiographic studies and interpretation of  complex data.   Critical Care Time devoted to patient care services, exclusive of separately billable procedures, described in this note is 35 minutes.   MD [facility] See Amion for pager  If no response to pager , please call [phone] until 7pm After 7:00 pm call Elink  [phone] 09/26/2020, 8:37 AM

## 2020-09-26 NOTE — Progress Notes (Signed)
PT Cancellation Note  Patient Details Name: Walter [name] MRN: [id number] DOB: 11-Nov-1955   Cancelled Treatment:    Reason Eval/Treat Not Completed: Active bedrest order - pt remains intubated, sedated, with active bedrest order. PT to check back for evaluation tomorrow.   Marye Round, PT Acute Rehabilitation Services Pager [phone]  Office [phone]    [name] [name] 09/26/2020, 9:00 AM

## 2020-09-26 NOTE — Progress Notes (Signed)
Patient transported to IR & back to room on ventilator with no problems.

## 2020-09-26 NOTE — Progress Notes (Signed)
2030 Consent obtained by Dr. Danielle Dess from pts wife  2100 Placement of ventriculostomy drain without complications by Dr. Danielle Dess.  Drain placed at 10cm H20

## 2020-09-26 NOTE — Progress Notes (Signed)
SLP Cancellation Note  Patient Details Name: Walter [name] MRN: [id number] DOB: 1955-09-28   Cancelled treatment:       Reason Eval/Treat Not Completed: Patient not medically ready. Will continue to follow for readiness.    Charliene Inoue, Riley Nearing 09/26/2020, 8:10 AM

## 2020-09-26 NOTE — Progress Notes (Addendum)
STROKE TEAM PROGRESS NOTE   INTERVAL HISTORY Overnight his Na increased too fast so 3% NaCl was reduced from 75 cc/hr to 25 cc/hr. Repeat CT showed worsening hydrocephalus.  He is still intubated and was not arousable after sedation was turned off. He will move all extremities to noxious stimuli however Left greater than Right with minimal movement in Right arm. Will undergo Cerebral Angiogram by IR today. Will have EVD placed by NeuroSurgery.  NeuroSurgery, IR, and CCM are following. Vitals:   09/26/20 0308 09/26/20 0400 09/26/20 0500 09/26/20 0600  BP:  125/70 134/67 115/70  Pulse: 67 66 62 62  Resp: 19 20 16 20   Temp:  98.7 F (37.1 C)    TempSrc:  Axillary    SpO2: 100% 100% 100% 100%  Weight:      Height:       CBC:  Recent Labs  Lab 09/25/20 0309 09/25/20 0328 09/25/20 0501  WBC 6.7  --   --   NEUTROABS 2.2  --   --   HGB 15.3 15.6 15.0  HCT 45.7 46.0 44.0  MCV 93.5  --   --   PLT 214  --   --    Basic Metabolic Panel:  Recent Labs  Lab 09/25/20 0309 09/25/20 0328 09/25/20 0501 09/25/20 0615 09/26/20 0005 09/26/20 0432  NA 137 140 139   < > 153* 147*  K 3.6 3.6 4.2  --   --   --   CL 104 102  --   --   --   --   CO2 25  --   --   --   --   --   GLUCOSE 169* 170*  --   --   --   --   BUN 15 18  --   --   --   --   CREATININE 1.00 0.90  --   --   --   --   CALCIUM 8.8*  --   --   --   --   --    < > = values in this interval not displayed.   Lipid Panel:  Recent Labs  Lab 09/25/20 1659  CHOL 155  TRIG 117  HDL 44  CHOLHDL 3.5  VLDL 23  LDLCALC 88   HgbA1c:  Recent Labs  Lab 09/25/20 0309  HGBA1C 5.7*   Urine Drug Screen:  Recent Labs  Lab 09/25/20 0304  LABOPIA NONE DETECTED  COCAINSCRNUR NONE DETECTED  LABBENZ NONE DETECTED  AMPHETMU NONE DETECTED  THCU NONE DETECTED  LABBARB NONE DETECTED    Alcohol Level  Recent Labs  Lab 09/25/20 0309  ETH <10    IMAGING past 24 hours CT Head Wo Contrast  Result Date:  09/26/2020 CLINICAL DATA:  Stroke follow-up EXAM: CT HEAD WITHOUT CONTRAST TECHNIQUE: Contiguous axial images were obtained from the base of the skull through the vertex without intravenous contrast. COMPARISON:  09/25/2020 FINDINGS: Brain: Unchanged left brainstem hemorrhage extending into the fourth ventricle. Worsening hydrocephalus. Vascular: No abnormal hyperdensity of the major intracranial arteries or dural venous sinuses. No intracranial atherosclerosis. Skull: The visualized skull base, calvarium and extracranial soft tissues are normal. Sinuses/Orbits: No fluid levels or advanced mucosal thickening of the visualized paranasal sinuses. No mastoid or middle ear effusion. The orbits are normal. IMPRESSION: 1. Unchanged left brainstem hemorrhage extending into the fourth ventricle. 2. Worsening hydrocephalus. Electronically Signed   By: 09/27/2020 M.D.   On: 09/26/2020 02:48   MR BRAIN W  WO CONTRAST  Result Date: 09/25/2020 CLINICAL DATA:  Intracranial hemorrhage EXAM: MRI HEAD WITHOUT AND WITH CONTRAST MRV HEAD WITHOUT AND WITH CONTRAST TECHNIQUE: Multiplanar, multiecho pulse sequences of the brain and surrounding structures were obtained without and with intravenous contrast. Angiographic images of the intracranial venous structures were obtained using MRV technique without and with intravenous contrast. COMPARISON:  None. CONTRAST:  68mL GADAVIST GADOBUTROL 1 MMOL/ML IV SOLN FINDINGS: MR HEAD FINDINGS Brain: As seen on CT imaging, there is an approximately 1.4 x 0.9 x 1.8 cm area of hemorrhage centered within the left dorsal aspect of the midbrain extending into the dorsal pons and inframedial thalamus with mild surrounding edema. Hemorrhage appears acute by signal characteristics. Intraventricular extension is noted by the cerebral aqueduct and the fourth ventricle. There is no abnormal enhancement. Compression of the cerebral aqueduct with mild prominence of third and lateral ventricles suggesting  a degree of obstructive hydrocephalus. Vascular: Major vessel flow voids at the skull base are preserved. Skull and upper cervical spine: Normal marrow signal is preserved. Sinuses/Orbits: Mild mucosal thickening a. Orbits are unremarkable. Other: Sella is unremarkable.  Mastoid air cells are clear. MRV HEAD FINDINGS Superior sagittal sinus, straight sinus, vein of Galen, and internal cerebral veins are patent. There is attenuation of the left basal vein in the quadrigeminal cistern without definite occlusion. Similar appearance on prior CTA. Both transverse and sigmoid sinuses are patent. IMPRESSION: Acute hemorrhage centered within the left dorsal midbrain extending into the dorsal pons and inferomedial thalamus with intraventricular extension. No abnormal enhancement to suggest underlying lesion. Compression of the cerebral aqueduct with possible mild obstructive hydrocephalus. No evidence of dural sinus thrombosis. Attenuation of the left basal vein in the quadrigeminal cistern without definite occlusion. Electronically Signed   By: Guadlupe Spanish M.D.   On: 09/25/2020 16:43   MR Venogram Head  Result Date: 09/25/2020 CLINICAL DATA:  Intracranial hemorrhage EXAM: MRI HEAD WITHOUT AND WITH CONTRAST MRV HEAD WITHOUT AND WITH CONTRAST TECHNIQUE: Multiplanar, multiecho pulse sequences of the brain and surrounding structures were obtained without and with intravenous contrast. Angiographic images of the intracranial venous structures were obtained using MRV technique without and with intravenous contrast. COMPARISON:  None. CONTRAST:  9mL GADAVIST GADOBUTROL 1 MMOL/ML IV SOLN FINDINGS: MR HEAD FINDINGS Brain: As seen on CT imaging, there is an approximately 1.4 x 0.9 x 1.8 cm area of hemorrhage centered within the left dorsal aspect of the midbrain extending into the dorsal pons and inframedial thalamus with mild surrounding edema. Hemorrhage appears acute by signal characteristics. Intraventricular extension is  noted by the cerebral aqueduct and the fourth ventricle. There is no abnormal enhancement. Compression of the cerebral aqueduct with mild prominence of third and lateral ventricles suggesting a degree of obstructive hydrocephalus. Vascular: Major vessel flow voids at the skull base are preserved. Skull and upper cervical spine: Normal marrow signal is preserved. Sinuses/Orbits: Mild mucosal thickening a. Orbits are unremarkable. Other: Sella is unremarkable.  Mastoid air cells are clear. MRV HEAD FINDINGS Superior sagittal sinus, straight sinus, vein of Galen, and internal cerebral veins are patent. There is attenuation of the left basal vein in the quadrigeminal cistern without definite occlusion. Similar appearance on prior CTA. Both transverse and sigmoid sinuses are patent. IMPRESSION: Acute hemorrhage centered within the left dorsal midbrain extending into the dorsal pons and inferomedial thalamus with intraventricular extension. No abnormal enhancement to suggest underlying lesion. Compression of the cerebral aqueduct with possible mild obstructive hydrocephalus. No evidence of dural sinus thrombosis. Attenuation of  the left basal vein in the quadrigeminal cistern without definite occlusion. Electronically Signed   By: Guadlupe Spanish M.D.   On: 09/25/2020 16:43   ECHOCARDIOGRAM COMPLETE  Result Date: 09/25/2020    ECHOCARDIOGRAM REPORT   Patient Name:   Walter [name] Date of Exam: 09/25/2020 Medical Rec #:  [id number]       Height:       74.0 in Accession #:    [id number]      Weight:       222.7 lb Date of Birth:  [birth year]       BSA:          2.275 m Patient Age:    65 years        BP:           123/73 mmHg Patient Gender: M               HR:           71 bpm. Exam Location:  Inpatient Procedure: 2D Echo, Cardiac Doppler and Color Doppler Indications:    CVA  History:        Patient has prior history of Echocardiogram examinations, most                 recent 09/06/2016. Arrythmias:Atrial Flutter.   Sonographer:    Neomia Dear RDCS Referring Phys: [id number] ASHISH ARORA IMPRESSIONS  1. Left ventricular ejection fraction, by estimation, is 55 to 60%. The left ventricle has normal function. The left ventricle has no regional wall motion abnormalities. Left ventricular diastolic parameters are consistent with Grade I diastolic dysfunction (impaired relaxation).  2. Right ventricular systolic function is normal. The right ventricular size is normal. There is normal pulmonary artery systolic pressure. The estimated right ventricular systolic pressure is 28.2 mmHg.  3. The mitral valve is normal in structure. No evidence of mitral valve regurgitation. No evidence of mitral stenosis.  4. The aortic valve is tricuspid. Aortic valve regurgitation is not visualized. No aortic stenosis is present.  5. The inferior vena cava is dilated in size with <50% respiratory variability, suggesting right atrial pressure of 15 mmHg. FINDINGS  Left Ventricle: Left ventricular ejection fraction, by estimation, is 55 to 60%. The left ventricle has normal function. The left ventricle has no regional wall motion abnormalities. The left ventricular internal cavity size was normal in size. There is  no left ventricular hypertrophy. Left ventricular diastolic parameters are consistent with Grade I diastolic dysfunction (impaired relaxation). Right Ventricle: The right ventricular size is normal. No increase in right ventricular wall thickness. Right ventricular systolic function is normal. There is normal pulmonary artery systolic pressure. The tricuspid regurgitant velocity is 1.82 m/s, and  with an assumed right atrial pressure of 15 mmHg, the estimated right ventricular systolic pressure is 28.2 mmHg. Left Atrium: Left atrial size was normal in size. Right Atrium: Right atrial size was normal in size. Pericardium: There is no evidence of pericardial effusion. Mitral Valve: The mitral valve is normal in structure. No evidence of mitral  valve regurgitation. No evidence of mitral valve stenosis. Tricuspid Valve: The tricuspid valve is normal in structure. Tricuspid valve regurgitation is trivial. Aortic Valve: The aortic valve is tricuspid. Aortic valve regurgitation is not visualized. No aortic stenosis is present. Aortic valve mean gradient measures 4.0 mmHg. Aortic valve peak gradient measures 8.0 mmHg. Aortic valve area, by VTI measures 3.37 cm. Pulmonic Valve: The pulmonic valve was normal in structure. Pulmonic valve regurgitation is not visualized.  Aorta: The aortic root is normal in size and structure. Venous: The inferior vena cava is dilated in size with less than 50% respiratory variability, suggesting right atrial pressure of 15 mmHg. IAS/Shunts: No atrial level shunt detected by color flow Doppler.  LEFT VENTRICLE PLAX 2D LVIDd:         4.20 cm      Diastology LVIDs:         2.50 cm      LV e' medial:    8.70 cm/s LV PW:         1.30 cm      LV E/e' medial:  5.7 LV IVS:        0.90 cm      LV e' lateral:   14.90 cm/s LVOT diam:     2.40 cm      LV E/e' lateral: 3.3 LV SV:         82 LV SV Index:   36 LVOT Area:     4.52 cm  LV Volumes (MOD) LV vol d, MOD A2C: 53.1 ml LV vol d, MOD A4C: 108.6 ml LV vol s, MOD A2C: 34.1 ml LV vol s, MOD A4C: 71.3 ml LV SV MOD A2C:     19.0 ml LV SV MOD A4C:     108.6 ml LV SV MOD BP:      25.5 ml RIGHT VENTRICLE TAPSE (M-mode): 3.3 cm  PULMONARY VEINS                         A Reversal Duration: 103.00 msec                         A Reversal Velocity: 27.70 cm/s                         Diastolic Velocity:  30.90 cm/s                         S/D Velocity:        1.10                         Systolic Velocity:   34.10 cm/s LEFT ATRIUM           Index       RIGHT ATRIUM           Index LA diam:      3.50 cm 1.54 cm/m  RA Area:     17.45 cm LA Vol (A2C): 46.7 ml 20.53 ml/m RA Volume:   41.75 ml  18.35 ml/m LA Vol (A4C): 58.0 ml 25.50 ml/m  AORTIC VALVE                   PULMONIC VALVE AV Area (Vmax):     3.56 cm    PV Vmax:       0.85 m/s AV Area (Vmean):   3.62 cm    PV Vmean:      57.700 cm/s AV Area (VTI):     3.37 cm    PV VTI:        0.151 m AV Vmax:           141.00 cm/s PV Peak grad:  2.9 mmHg AV Vmean:          91.900 cm/s PV Mean grad:  2.0 mmHg AV  VTI:            0.244 m AV Peak Grad:      8.0 mmHg AV Mean Grad:      4.0 mmHg LVOT Vmax:         111.00 cm/s LVOT Vmean:        73.600 cm/s LVOT VTI:          0.182 m LVOT/AV VTI ratio: 0.75  AORTA Ao Root diam: 3.60 cm Ao Asc diam:  3.30 cm MITRAL VALVE               TRICUSPID VALVE MV Area (PHT): 1.85 cm    TR Peak grad:   13.2 mmHg MV Decel Time: 409 msec    TR Vmax:        182.00 cm/s MV E velocity: 49.50 cm/s MV A velocity: 67.40 cm/s  SHUNTS MV E/A ratio:  0.73        Systemic VTI:  0.18 m                            Systemic Diam: 2.40 cm Marca Ancona MD Electronically signed by Marca Ancona MD Signature Date/Time: 09/25/2020/3:37:26 PM    Final     PHYSICAL EXAM Blood pressure 115/70, pulse 62, temperature 98.7 F (37.1 C), temperature source Axillary, resp. rate 20, height 6\' 2"  (1.88 m), weight 101 kg, SpO2 100 %.  General: sedated and intubated, elderly caucasian male, no apparent distress  Lungs: Symmetrical Chest rise, no labored breathing  Cardio: Regular Rate and Rhythm  Abdomen: Soft, non-tender  Neurological Exam: Sedated and intubated. Eyes closed. Pupils are approx. 2 mm and minimally reactive to light. Eyes are disconjugate with right eye esotropia.. Grimaces to noxious stimuli symmetrically. Withdraws all extremities to noxious stimuli Left greater than Right and minimally in RUE. Plantars equivocal bilaterally.  ASSESSMENT/PLAN Walter [name] is a 65 y.o. male with history of atrial flutter newly diagnosed with atrial fibrillation in February not on anticoagulation, follows with Dr. Johney Frame in cardiology, presenting with Right-sided weakness, slurred speech, headache, vomiting. Last known well when he went to bed  at 10 PM on 09/24/2020 and woke up at around 2 AM this morning and complained to his wife that he was having some discomfort in his head or ear. She was unable to understand him well and thought his speech was slurred. EMS was called.He was very drowsy and sleepy upon their assessment and was weaker on the right side in comparison to the left. His vitals were not too abnormal-systolic blood pressures were in the 140- 150 range on scene.   CT shows worsening hydrocephalus.Given this will increase 3% NaCl to 60 cc/hr and continue to monitor. NeuroSurgery has seen patient and plans to place EVD this afternoon. IR has seen patient and thinks that Cerebral Angiogram is warranted and will do this this morning. Because of these procedures will keep him intubated at least until tomorrow.    Left Dorsal midbrain intraparenchymal Hemorrhage extending into dorsal pons and inferior thalamus with mild mass-effect on third ventricle and hydrocephalus source unknown-suspect cavernoma  Code Stroke CT head - Acute intraparenchymal hemorrhage in the dorsal left pons with subarachnoid extension into the cerebral aqueduct and fourth ventricle.  CTA head & neck - No vascular malformation, anomaly or aneurysm. No emergent large vessel occlusion or high-grade stenosis of the intracranial arteries. Unchanged size of dorsal left pontine hemorrhage with intraventricular extension.  MRI &  MR Venogram- Acute hemorrhage centered within the left dorsal midbrain extending into the dorsal pons and inferomedial thalamus with intraventricular extension. No abnormal enhancement to suggest underlying lesion. Compression of the cerebral aqueduct with possible mild obstructive hydrocephalus. No evidence of dural sinus thrombosis. Attenuation of the left basal vein in the quadrigeminal cistern without definite occlusion.  CT Head W/O Contrast 3/29 0250 - Unchanged left brainstem hemorrhage extending into the fourth ventricle. Worsening  hydrocephalus.  2D Echo - EF:55-60%. No wall motion abnormalities.  LDL - 88  HgbA1c 5.7  VTE prophylaxis - None        Diet   Diet NPO time specified         No antithrombotic prior to admission, now on No antithrombotic at this time due to acute hemorrhage.   Therapy recommendations:  Pending  Disposition:  Pending  Hypertension  Home meds:  Diltiazem  Stable  Long-term BP goal normotensive  Hyperlipidemia  Home meds:  none  LDL 88, goal < 70  Add statin tomorrow after hemorrhage is stable  High intensity statin Atorvastatin  Continue statin at discharge  Cerebral Edema  Currently on 3% NaCl at 25 cc/hr  Na goal is 150-160  0551 Na - 147  Other Stroke Risk Factors  Advanced Age >/= 1765    Hospital day # 1  Arna SnipeAlex Pashayan MD Resident I have personally obtained history,examined this patient, reviewed notes, independently viewed imaging studies, participated in medical decision making and plan of care.ROS completed by me personally and pertinent positives fully documented  I have made any additions or clarifications directly to the above note. Agree with note above.. Patient neurological exam remains poor and prognosis is guarded given location of the hemorrhage.  Continue ventilatory support for respiratory failure and strict control of hypertension with blood pressure goal below 160.  Continue hypertonic saline with serum sodium goal 150-155.  Plan diagnostic cerebral catheter angiogram today to look for small brainstem AVM though even if angio is negative may need to consider repeating it after 2 3 months after the hematoma resolves.  Neurosurgery plan to do ventriculostomy as follow-up CT from today shows slight worsening of hydrocephalus.  Discussed with Dr. Tommi Rumpse. Melchor AmourMacedo Rodrigues, Dr. Isaiah SergeMannam and patient's wife and answered questions. This patient is critically ill and at significant risk of neurological worsening, death and care requires constant  monitoring of vital signs, hemodynamics,respiratory and cardiac monitoring, extensive review of multiple databases, frequent neurological assessment, discussion with family, other specialists and medical decision making of high complexity.I have made any additions or clarifications directly to the above note.This critical care time does not reflect procedure time, or teaching time or supervisory time of PA/NP/Med Resident etc but could involve care discussion time.  I spent 40 minutes of neurocritical care time  in the care of  this patient.      Delia HeadyPramod Aundra Pung, MD Medical Director [facility] Pager: [phone] 09/26/2020 3:42 PM    To contact Stroke Continuity provider, please refer to [email or web address]. After hours, contact General Neurology    Unchanged left brainstem hemorrhage extending into the fourth ventricle. Worsening hydrocephalus.

## 2020-09-26 NOTE — Progress Notes (Signed)
Pts sodium increased from 142 at 1700 lab draw to 153 at midnight.  Dr. Amada Jupiter made aware, orders given to decrease 3% rate to 40mL/hr.

## 2020-09-26 NOTE — Progress Notes (Signed)
Dr. Danielle Dess, neuroSx, made aware of pt's newest CT head results which he will review.  Neurological assessment remains unchanged in pupils and movements.  No new orders or change in plan of care at this time.

## 2020-09-26 NOTE — Consult Note (Addendum)
Chief Complaint: Patient was seen in consultation today for diagnostic cerebral arteriogram Chief Complaint  Patient presents with  . Code Stroke: Slurred Speech/Right Weakness   at the request of Dr. Pearlean Brownie, P.   Referring Physician(s): Dr. Pearlean Brownie, P.   Supervising Physician: Dr. Tommie Sams, K.   Patient Status: [name] - In-pt  History of Present Illness: [name] is a 65 y.o. male with PMH significant for atrial fibulation diagnosed in February 2022. Patient is not on anticoagulation for a-fib. Patient presented to [facility] ED on 09/25/20 for right-sided weakness, slurred speech, headache, and vomiting. Code stroke was initiated and patient was found to have an acute intraparenchymal hemorrhage in the dorsal left pons with subarachnoid extension into the cerebral aqueduct and fourth ventricle. CTA head and neck was negative for vascular malformation, aneurysm, large vessel occlusion, or high-grade stenosis of the intracranial arteries. Due to the location of the hemorrhage, patient underwent MRI brain W WO and venogram, which showed no definitive site of hemorrhage.  NIR was requested for diagnostic cerebral arteriogram.   Patient laying in bed, intubated without sedation.  ROS was not performed due to patient condition.  Patient did not open his eyes for painful stimuli; however, he withdrew his  left hand to a painful stimuli.    Past Medical History:  Diagnosis Date  . Atrial flutter (HCC)   . Dizzy 09/25/13  . Near syncope 09/25/13    Past Surgical History:  Procedure Laterality Date  . A-FLUTTER ABLATION N/A 03/09/2020   Procedure: A-FLUTTER ABLATION;  Surgeon: Hillis Range, MD;  Location: [facility] INVASIVE CV LAB;  Service: Cardiovascular;  Laterality: N/A;  . CARDIOVERSION N/A 09/27/2013   Procedure: CARDIOVERSION;  Surgeon: Lewayne Bunting, MD;  Location: [facility] ENDOSCOPY;  Service: Cardiovascular;  Laterality: N/A;  . RETINAL DETACHMENT SURGERY    . right knee  arthroscopy    . TEE WITHOUT CARDIOVERSION N/A 09/27/2013   Procedure: TRANSESOPHAGEAL ECHOCARDIOGRAM (TEE);  Surgeon: Lewayne Bunting, MD;  Location: [facility] ENDOSCOPY;  Service: Cardiovascular;  Laterality: N/A;  . WRIST SURGERY      Allergies: Latex  Medications: Prior to Admission medications   Medication Sig Start Date End Date Taking? Authorizing Provider  Bioflavonoid Products (ESTER-C) 500-550 MG TABS Take 1 tablet by mouth daily.   Yes [provider]  cholecalciferol (VITAMIN D3) 25 MCG (1000 UNIT) tablet Take 1,000 Units by mouth daily.   Yes [provider]  diltiazem (CARDIZEM CD) 120 MG 24 hr capsule Take 1 capsule (120 mg total) by mouth daily. 08/21/20  Yes Allred, Fayrene Fearing, MD  EPINEPHrine 0.3 mg/0.3 mL IJ SOAJ injection Inject 0.3 mg into the muscle once as needed for anaphylaxis. 02/07/20  Yes [provider]  diltiazem (CARDIZEM) 30 MG tablet Take 1 tablet (30 mg total) by mouth daily as needed (1-2 tablets every 4-6 hrs for fast heart rate.). Patient not taking: No sig reported 07/03/20   Hillis Range, MD     Family History  Problem Relation Age of Onset  . Heart disease Mother   . Heart disease Father   . Healthy Sister   . Healthy Brother     Social History   Socioeconomic History  . Marital status: Married    Spouse name: Not on file  . Number of children: Not on file  . Years of education: Not on file  . Highest education level: Not on file  Occupational History  . Not on file  Tobacco Use  .  Smoking status: Never Smoker  . Smokeless tobacco: Never Used  Substance and Sexual Activity  . Alcohol use: Yes    Comment: occasionally  . Drug use: No  . Sexual activity: Not on file  Other Topics Concern  . Not on file  Social History Narrative   Pt lives in [name].  Works as a Copywriter, advertising for [employer].   Married. 2 children   Social Determinants of Corporate investment banker Strain: Not on file  Food Insecurity: Not  on file  Transportation Needs: Not on file  Physical Activity: Not on file  Stress: Not on file  Social Connections: Not on file      Review of Systems: patient intubated, not respond to verbal stimuli, not performed    Vital Signs: BP (!) 149/71   Pulse 68   Temp 98.7 F (37.1 C) (Axillary)   Resp 20   Ht 6\' 2"  (1.88 m)   Wt 222 lb 10.6 oz (101 kg)   SpO2 100%   BMI 28.59 kg/m   Physical Exam Vitals and nursing note reviewed.  Constitutional:      Comments: Intubated without sedation. Patient not responsive to verbal stimuli.   Eyes:     Pupils: Pupils are equal, round, and reactive to light.     Comments:    Cardiovascular:     Rate and Rhythm: Normal rate and regular rhythm.     Pulses: Normal pulses.     Heart sounds: Normal heart sounds.  Pulmonary:     Effort: Pulmonary effort is normal.     Breath sounds: Normal breath sounds.  Skin:    General: Skin is warm and dry.  Neurological:     Comments: Intubated without sedation.  Non responsive to verbal stimuli.  Left hand and left lower leg withdraw from painful stimuli. PERRL midline bilaterally.      Imaging: CT ANGIO HEAD W OR WO CONTRAST  Result Date: 09/25/2020 CLINICAL DATA:  Right-sided weakness EXAM: CT ANGIOGRAPHY HEAD AND NECK TECHNIQUE: Multidetector CT imaging of the head and neck was performed using the standard protocol during bolus administration of intravenous contrast. Multiplanar CT image reconstructions and MIPs were obtained to evaluate the vascular anatomy. Carotid stenosis measurements (when applicable) are obtained utilizing NASCET criteria, using the distal internal carotid diameter as the denominator. CONTRAST:  66mL OMNIPAQUE IOHEXOL 350 MG/ML SOLN COMPARISON:  None. FINDINGS: CTA NECK FINDINGS SKELETON: There is no bony spinal canal stenosis. No lytic or blastic lesion. OTHER NECK: Normal pharynx, larynx and major salivary glands. No cervical lymphadenopathy. Unremarkable thyroid gland.  UPPER CHEST: No pneumothorax or pleural effusion. No nodules or masses. AORTIC ARCH: There is no calcific atherosclerosis of the aortic arch. There is no aneurysm, dissection or hemodynamically significant stenosis of the visualized portion of the aorta. Conventional 3 vessel aortic branching pattern. The visualized proximal subclavian arteries are widely patent. RIGHT CAROTID SYSTEM: Normal without aneurysm, dissection or stenosis. LEFT CAROTID SYSTEM: Normal without aneurysm, dissection or stenosis. VERTEBRAL ARTERIES: Left dominant configuration. Both origins are clearly patent. There is no dissection, occlusion or flow-limiting stenosis to the skull base (V1-V3 segments). CTA HEAD FINDINGS POSTERIOR CIRCULATION: --Vertebral arteries: Normal V4 segments. --Inferior cerebellar arteries: Normal. --Basilar artery: Normal. --Superior cerebellar arteries: Normal. --Posterior cerebral arteries (PCA): Normal. ANTERIOR CIRCULATION: --Intracranial internal carotid arteries: Normal. --Anterior cerebral arteries (ACA): Normal. Both A1 segments are present. Patent anterior communicating artery (a-comm). --Middle cerebral arteries (MCA): Normal. VENOUS SINUSES: As permitted by contrast timing, patent.  ANATOMIC VARIANTS: None Review of the MIP images confirms the above findings. 5 minute delay head CT shows unchanged size of dorsal left pontine hemorrhage with intraventricular extension. IMPRESSION: 1. No vascular malformation, anomaly or aneurysm. 2. No emergent large vessel occlusion or high-grade stenosis of the intracranial arteries. 3. Unchanged size of dorsal left pontine hemorrhage with intraventricular extension. These results were called by telephone at the time of interpretation on 09/25/2020 at 3:32 am to provider [name] , who verbally acknowledged these results. Electronically Signed   By: Deatra Robinson M.D.   On: 09/25/2020 03:33   CT Head Wo Contrast  Result Date: 09/26/2020 CLINICAL DATA:  Stroke  follow-up EXAM: CT HEAD WITHOUT CONTRAST TECHNIQUE: Contiguous axial images were obtained from the base of the skull through the vertex without intravenous contrast. COMPARISON:  09/25/2020 FINDINGS: Brain: Unchanged left brainstem hemorrhage extending into the fourth ventricle. Worsening hydrocephalus. Vascular: No abnormal hyperdensity of the major intracranial arteries or dural venous sinuses. No intracranial atherosclerosis. Skull: The visualized skull base, calvarium and extracranial soft tissues are normal. Sinuses/Orbits: No fluid levels or advanced mucosal thickening of the visualized paranasal sinuses. No mastoid or middle ear effusion. The orbits are normal. IMPRESSION: 1. Unchanged left brainstem hemorrhage extending into the fourth ventricle. 2. Worsening hydrocephalus. Electronically Signed   By: Deatra Robinson M.D.   On: 09/26/2020 02:48   CT ANGIO NECK W OR WO CONTRAST  Result Date: 09/25/2020 CLINICAL DATA:  Right-sided weakness EXAM: CT ANGIOGRAPHY HEAD AND NECK TECHNIQUE: Multidetector CT imaging of the head and neck was performed using the standard protocol during bolus administration of intravenous contrast. Multiplanar CT image reconstructions and MIPs were obtained to evaluate the vascular anatomy. Carotid stenosis measurements (when applicable) are obtained utilizing NASCET criteria, using the distal internal carotid diameter as the denominator. CONTRAST:  75mL OMNIPAQUE IOHEXOL 350 MG/ML SOLN COMPARISON:  None. FINDINGS: CTA NECK FINDINGS SKELETON: There is no bony spinal canal stenosis. No lytic or blastic lesion. OTHER NECK: Normal pharynx, larynx and major salivary glands. No cervical lymphadenopathy. Unremarkable thyroid gland. UPPER CHEST: No pneumothorax or pleural effusion. No nodules or masses. AORTIC ARCH: There is no calcific atherosclerosis of the aortic arch. There is no aneurysm, dissection or hemodynamically significant stenosis of the visualized portion of the aorta.  Conventional 3 vessel aortic branching pattern. The visualized proximal subclavian arteries are widely patent. RIGHT CAROTID SYSTEM: Normal without aneurysm, dissection or stenosis. LEFT CAROTID SYSTEM: Normal without aneurysm, dissection or stenosis. VERTEBRAL ARTERIES: Left dominant configuration. Both origins are clearly patent. There is no dissection, occlusion or flow-limiting stenosis to the skull base (V1-V3 segments). CTA HEAD FINDINGS POSTERIOR CIRCULATION: --Vertebral arteries: Normal V4 segments. --Inferior cerebellar arteries: Normal. --Basilar artery: Normal. --Superior cerebellar arteries: Normal. --Posterior cerebral arteries (PCA): Normal. ANTERIOR CIRCULATION: --Intracranial internal carotid arteries: Normal. --Anterior cerebral arteries (ACA): Normal. Both A1 segments are present. Patent anterior communicating artery (a-comm). --Middle cerebral arteries (MCA): Normal. VENOUS SINUSES: As permitted by contrast timing, patent. ANATOMIC VARIANTS: None Review of the MIP images confirms the above findings. 5 minute delay head CT shows unchanged size of dorsal left pontine hemorrhage with intraventricular extension. IMPRESSION: 1. No vascular malformation, anomaly or aneurysm. 2. No emergent large vessel occlusion or high-grade stenosis of the intracranial arteries. 3. Unchanged size of dorsal left pontine hemorrhage with intraventricular extension. These results were called by telephone at the time of interpretation on 09/25/2020 at 3:32 am to provider [facility] , who verbally acknowledged these results. Electronically Signed  By: Deatra Robinson M.D.   On: 09/25/2020 03:33   MR BRAIN W WO CONTRAST  Result Date: 09/25/2020 CLINICAL DATA:  Intracranial hemorrhage EXAM: MRI HEAD WITHOUT AND WITH CONTRAST MRV HEAD WITHOUT AND WITH CONTRAST TECHNIQUE: Multiplanar, multiecho pulse sequences of the brain and surrounding structures were obtained without and with intravenous contrast. Angiographic images of  the intracranial venous structures were obtained using MRV technique without and with intravenous contrast. COMPARISON:  None. CONTRAST:  37mL GADAVIST GADOBUTROL 1 MMOL/ML IV SOLN FINDINGS: MR HEAD FINDINGS Brain: As seen on CT imaging, there is an approximately 1.4 x 0.9 x 1.8 cm area of hemorrhage centered within the left dorsal aspect of the midbrain extending into the dorsal pons and inframedial thalamus with mild surrounding edema. Hemorrhage appears acute by signal characteristics. Intraventricular extension is noted by the cerebral aqueduct and the fourth ventricle. There is no abnormal enhancement. Compression of the cerebral aqueduct with mild prominence of third and lateral ventricles suggesting a degree of obstructive hydrocephalus. Vascular: Major vessel flow voids at the skull base are preserved. Skull and upper cervical spine: Normal marrow signal is preserved. Sinuses/Orbits: Mild mucosal thickening a. Orbits are unremarkable. Other: Sella is unremarkable.  Mastoid air cells are clear. MRV HEAD FINDINGS Superior sagittal sinus, straight sinus, vein of Galen, and internal cerebral veins are patent. There is attenuation of the left basal vein in the quadrigeminal cistern without definite occlusion. Similar appearance on prior CTA. Both transverse and sigmoid sinuses are patent. IMPRESSION: Acute hemorrhage centered within the left dorsal midbrain extending into the dorsal pons and inferomedial thalamus with intraventricular extension. No abnormal enhancement to suggest underlying lesion. Compression of the cerebral aqueduct with possible mild obstructive hydrocephalus. No evidence of dural sinus thrombosis. Attenuation of the left basal vein in the quadrigeminal cistern without definite occlusion. Electronically Signed   By: Guadlupe Spanish M.D.   On: 09/25/2020 16:43   MR Venogram Head  Result Date: 09/25/2020 CLINICAL DATA:  Intracranial hemorrhage EXAM: MRI HEAD WITHOUT AND WITH CONTRAST MRV HEAD  WITHOUT AND WITH CONTRAST TECHNIQUE: Multiplanar, multiecho pulse sequences of the brain and surrounding structures were obtained without and with intravenous contrast. Angiographic images of the intracranial venous structures were obtained using MRV technique without and with intravenous contrast. COMPARISON:  None. CONTRAST:  7mL GADAVIST GADOBUTROL 1 MMOL/ML IV SOLN FINDINGS: MR HEAD FINDINGS Brain: As seen on CT imaging, there is an approximately 1.4 x 0.9 x 1.8 cm area of hemorrhage centered within the left dorsal aspect of the midbrain extending into the dorsal pons and inframedial thalamus with mild surrounding edema. Hemorrhage appears acute by signal characteristics. Intraventricular extension is noted by the cerebral aqueduct and the fourth ventricle. There is no abnormal enhancement. Compression of the cerebral aqueduct with mild prominence of third and lateral ventricles suggesting a degree of obstructive hydrocephalus. Vascular: Major vessel flow voids at the skull base are preserved. Skull and upper cervical spine: Normal marrow signal is preserved. Sinuses/Orbits: Mild mucosal thickening a. Orbits are unremarkable. Other: Sella is unremarkable.  Mastoid air cells are clear. MRV HEAD FINDINGS Superior sagittal sinus, straight sinus, vein of Galen, and internal cerebral veins are patent. There is attenuation of the left basal vein in the quadrigeminal cistern without definite occlusion. Similar appearance on prior CTA. Both transverse and sigmoid sinuses are patent. IMPRESSION: Acute hemorrhage centered within the left dorsal midbrain extending into the dorsal pons and inferomedial thalamus with intraventricular extension. No abnormal enhancement to suggest underlying lesion. Compression of the  cerebral aqueduct with possible mild obstructive hydrocephalus. No evidence of dural sinus thrombosis. Attenuation of the left basal vein in the quadrigeminal cistern without definite occlusion. Electronically  Signed   By: Guadlupe Spanish M.D.   On: 09/25/2020 16:43   DG Chest Port 1 View  Result Date: 09/25/2020 CLINICAL DATA:  Status post intubation EXAM: PORTABLE CHEST 1 VIEW COMPARISON:  09/25/2013 FINDINGS: Cardiac shadow is enlarged but stable. Endotracheal tube and gastric catheter are noted in satisfactory position. The overall inspiratory effort is poor. No bony abnormality is noted. IMPRESSION: Tubes and lines as described above.  No acute abnormality noted. Electronically Signed   By: Alcide Clever M.D.   On: 09/25/2020 03:49   ECHOCARDIOGRAM COMPLETE  Result Date: 09/25/2020    ECHOCARDIOGRAM REPORT   Patient Name:   [name] Date of Exam: 09/25/2020 Medical Rec #:  [id number]       Height:       74.0 in Accession #:    [id number]      Weight:       222.7 lb Date of Birth:  [birth year]       BSA:          2.275 m Patient Age:    65 years        BP:           123/73 mmHg Patient Gender: M               HR:           71 bpm. Exam Location:  Inpatient Procedure: 2D Echo, Cardiac Doppler and Color Doppler Indications:    CVA  History:        Patient has prior history of Echocardiogram examinations, most                 recent 09/06/2016. Arrythmias:Atrial Flutter.  Sonographer:    Neomia Dear RDCS Referring Phys: [id number] ASHISH ARORA IMPRESSIONS  1. Left ventricular ejection fraction, by estimation, is 55 to 60%. The left ventricle has normal function. The left ventricle has no regional wall motion abnormalities. Left ventricular diastolic parameters are consistent with Grade I diastolic dysfunction (impaired relaxation).  2. Right ventricular systolic function is normal. The right ventricular size is normal. There is normal pulmonary artery systolic pressure. The estimated right ventricular systolic pressure is 28.2 mmHg.  3. The mitral valve is normal in structure. No evidence of mitral valve regurgitation. No evidence of mitral stenosis.  4. The aortic valve is tricuspid. Aortic valve regurgitation is  not visualized. No aortic stenosis is present.  5. The inferior vena cava is dilated in size with <50% respiratory variability, suggesting right atrial pressure of 15 mmHg. FINDINGS  Left Ventricle: Left ventricular ejection fraction, by estimation, is 55 to 60%. The left ventricle has normal function. The left ventricle has no regional wall motion abnormalities. The left ventricular internal cavity size was normal in size. There is  no left ventricular hypertrophy. Left ventricular diastolic parameters are consistent with Grade I diastolic dysfunction (impaired relaxation). Right Ventricle: The right ventricular size is normal. No increase in right ventricular wall thickness. Right ventricular systolic function is normal. There is normal pulmonary artery systolic pressure. The tricuspid regurgitant velocity is 1.82 m/s, and  with an assumed right atrial pressure of 15 mmHg, the estimated right ventricular systolic pressure is 28.2 mmHg. Left Atrium: Left atrial size was normal in size. Right Atrium: Right atrial size was normal in size. Pericardium: There is no  evidence of pericardial effusion. Mitral Valve: The mitral valve is normal in structure. No evidence of mitral valve regurgitation. No evidence of mitral valve stenosis. Tricuspid Valve: The tricuspid valve is normal in structure. Tricuspid valve regurgitation is trivial. Aortic Valve: The aortic valve is tricuspid. Aortic valve regurgitation is not visualized. No aortic stenosis is present. Aortic valve mean gradient measures 4.0 mmHg. Aortic valve peak gradient measures 8.0 mmHg. Aortic valve area, by VTI measures 3.37 cm. Pulmonic Valve: The pulmonic valve was normal in structure. Pulmonic valve regurgitation is not visualized. Aorta: The aortic root is normal in size and structure. Venous: The inferior vena cava is dilated in size with less than 50% respiratory variability, suggesting right atrial pressure of 15 mmHg. IAS/Shunts: No atrial level shunt  detected by color flow Doppler.  LEFT VENTRICLE PLAX 2D LVIDd:         4.20 cm      Diastology LVIDs:         2.50 cm      LV e' medial:    8.70 cm/s LV PW:         1.30 cm      LV E/e' medial:  5.7 LV IVS:        0.90 cm      LV e' lateral:   14.90 cm/s LVOT diam:     2.40 cm      LV E/e' lateral: 3.3 LV SV:         82 LV SV Index:   36 LVOT Area:     4.52 cm  LV Volumes (MOD) LV vol d, MOD A2C: 53.1 ml LV vol d, MOD A4C: 108.6 ml LV vol s, MOD A2C: 34.1 ml LV vol s, MOD A4C: 71.3 ml LV SV MOD A2C:     19.0 ml LV SV MOD A4C:     108.6 ml LV SV MOD BP:      25.5 ml RIGHT VENTRICLE TAPSE (M-mode): 3.3 cm  PULMONARY VEINS                         A Reversal Duration: 103.00 msec                         A Reversal Velocity: 27.70 cm/s                         Diastolic Velocity:  30.90 cm/s                         S/D Velocity:        1.10                         Systolic Velocity:   34.10 cm/s LEFT ATRIUM           Index       RIGHT ATRIUM           Index LA diam:      3.50 cm 1.54 cm/m  RA Area:     17.45 cm LA Vol (A2C): 46.7 ml 20.53 ml/m RA Volume:   41.75 ml  18.35 ml/m LA Vol (A4C): 58.0 ml 25.50 ml/m  AORTIC VALVE                   PULMONIC VALVE AV Area (Vmax):    3.56 cm  PV Vmax:       0.85 m/s AV Area (Vmean):   3.62 cm    PV Vmean:      57.700 cm/s AV Area (VTI):     3.37 cm    PV VTI:        0.151 m AV Vmax:           141.00 cm/s PV Peak grad:  2.9 mmHg AV Vmean:          91.900 cm/s PV Mean grad:  2.0 mmHg AV VTI:            0.244 m AV Peak Grad:      8.0 mmHg AV Mean Grad:      4.0 mmHg LVOT Vmax:         111.00 cm/s LVOT Vmean:        73.600 cm/s LVOT VTI:          0.182 m LVOT/AV VTI ratio: 0.75  AORTA Ao Root diam: 3.60 cm Ao Asc diam:  3.30 cm MITRAL VALVE               TRICUSPID VALVE MV Area (PHT): 1.85 cm    TR Peak grad:   13.2 mmHg MV Decel Time: 409 msec    TR Vmax:        182.00 cm/s MV E velocity: 49.50 cm/s MV A velocity: 67.40 cm/s  SHUNTS MV E/A ratio:  0.73        Systemic VTI:   0.18 m                            Systemic Diam: 2.40 cm Marca Ancona MD Electronically signed by Marca Ancona MD Signature Date/Time: 09/25/2020/3:37:26 PM    Final    CT HEAD CODE STROKE WO CONTRAST  Result Date: 09/25/2020 CLINICAL DATA:  Code stroke.  Right-sided weakness EXAM: CT HEAD WITHOUT CONTRAST TECHNIQUE: Contiguous axial images were obtained from the base of the skull through the vertex without intravenous contrast. COMPARISON:  None. FINDINGS: Brain: Acute intraparenchymal hemorrhage in the dorsal left pons with subarachnoid extension into the cerebral aqueduct and fourth ventricle. No midline shift or other mass effect. No hydrocephalus. Vascular: No abnormal hyperdensity of the major intracranial arteries or dural venous sinuses. No intracranial atherosclerosis. Skull: The visualized skull base, calvarium and extracranial soft tissues are normal. Sinuses/Orbits: No fluid levels or advanced mucosal thickening of the visualized paranasal sinuses. No mastoid or middle ear effusion. The orbits are normal. IMPRESSION: Acute intraparenchymal hemorrhage in the dorsal left pons with subarachnoid extension into the cerebral aqueduct and fourth ventricle. Critical Value/emergent results were called by telephone at the time of interpretation on 09/25/2020 at 3:16 am to provider [name], who verbally acknowledged these results. Electronically Signed   By: Deatra Robinson M.D.   On: 09/25/2020 03:16    Labs:  CBC: Recent Labs    12/13/19 1155 03/02/20 0853 09/25/20 0309 09/25/20 0328 09/25/20 0501  WBC 4.7 4.6 6.7  --   --   HGB 16.3 14.8 15.3 15.6 15.0  HCT 48.7 43.2 45.7 46.0 44.0  PLT 243 209 214  --   --     COAGS: Recent Labs    09/25/20 0309  INR 1.0  APTT 27    BMP: Recent Labs    12/13/19 1155 03/02/20 0853 09/25/20 0309 09/25/20 0328 09/25/20 0501 09/25/20 0615 09/25/20 1201 09/25/20 1659 09/26/20 0005 09/26/20 0432  NA  139 141 137 140 139   < > 142 142  153* 147*  K 4.3 4.1 3.6 3.6 4.2  --   --   --   --   --   CL 102 103 104 102  --   --   --   --   --   --   CO2 27 23 25   --   --   --   --   --   --   --   GLUCOSE 103* 115* 169* 170*  --   --   --   --   --   --   BUN 13 13 15 18   --   --   --   --   --   --   CALCIUM 9.8 9.4 8.8*  --   --   --   --   --   --   --   CREATININE 1.04 0.98 1.00 0.90  --   --   --   --   --   --   GFRNONAA >60 81 >60  --   --   --   --   --   --   --   GFRAA >60 94  --   --   --   --   --   --   --   --    < > = values in this interval not displayed.    LIVER FUNCTION TESTS: Recent Labs    09/25/20 0309  BILITOT 0.9  AST 15  ALT 14  ALKPHOS 51  PROT 6.1*  ALBUMIN 3.7    TUMOR MARKERS: No results for input(s): AFPTM, CEA, CA199, CHROMGRNA in the last 8760 hours.  Assessment and Plan:  Acute intraparenchymal hemorrhage in the dorsal left pons with subarachnoid extension into the cerebral aqueduct and fourth ventricle IR was requested for diagnostic cerebral arteriogram by Dr. Pearlean BrownieSethi, P. Case was reviewed and approved by Dr. Tommie Samsde Macedo Rodrigues Renal function stable, BUN 15, Creatinine 1.0, GFR >60  CBC reviewed, all within normal range  INR 1.0  Afebrile  As patient is not capable of making his own medical decisions at this time, consent was obtain from patient's wife, Mrs. Kerrie PleasureKathy Insley.  Risks and benefits of diagnostic cerebral arteriogram were discussed with the patient's wife including, but not limited to bleeding, infection, vascular injury, stroke, or contrast induced renal failure.  This interventional procedure involves the use of X-rays and because of the nature of the planned procedure, it is possible that we will have prolonged use of X-ray fluoroscopy.  Potential radiation risks to you include (but are not limited to) the following: - A slightly elevated risk for cancer  several years later in life. This risk is typically less than 0.5% percent. This risk is low in comparison to  the normal incidence of human cancer, which is 33% for women and 50% for men according to the [employer]. - Radiation induced injury can include skin redness, resembling a rash, tissue breakdown / ulcers and hair loss (which can be temporary or permanent).   The likelihood of either of these occurring depends on the difficulty of the procedure and whether you are sensitive to radiation due to previous procedures, disease, or genetic conditions.   IF your procedure requires a prolonged use of radiation, you will be notified and given written instructions for further action.  It is your responsibility to monitor the irradiated area for the 2  weeks following the procedure and to notify your physician if you are concerned that you have suffered a radiation induced injury.    All of the patient's wife's questions were answered, she is agreeable to proceed.  Consent signed and in IR binder.   Thank you for this interesting consult.  I greatly enjoyed meeting ARYAMAN HALIBURTON and look forward to participating in their care.  A copy of this report was sent to the requesting provider on this date.  Electronically Signed: Willette Brace, PA-C 09/26/2020, 8:46 AM   I spent a total of 20 Minutes   in face to face in clinical consultation, greater than 50% of which was counseling/coordinating care for diagnostic cerebral arteriogram

## 2020-09-26 NOTE — Progress Notes (Addendum)
OT Cancellation Note  Patient Details Name: Walter [name] MRN: [id number] DOB: 11-24-1955   Cancelled Treatment:    Reason Eval/Treat Not Completed: Active bedrest order (Intubated, sedated, and bedrest order. Will return as schedule allows and pt medically ready. Thank you.)   Addendum @ 847: Per RN, pt awaiting procedures and plan for staying intubated today. Will check back as schedule allows tomorrow. Thank you.  Solina Heron M Marcelino Campos Laityn Bensen MSOT, OTR/L Acute Rehab Pager: [phone] Office: [phone] 09/26/2020, 7:31 AM

## 2020-09-27 DIAGNOSIS — I613 Nontraumatic intracerebral hemorrhage in brain stem: Secondary | ICD-10-CM | POA: Diagnosis not present

## 2020-09-27 LAB — SODIUM
Sodium: 151 mmol/L — ABNORMAL HIGH (ref 135–145)
Sodium: 153 mmol/L — ABNORMAL HIGH (ref 135–145)
Sodium: 155 mmol/L — ABNORMAL HIGH (ref 135–145)
Sodium: 150 mmol/L — ABNORMAL HIGH (ref 135–145)

## 2020-09-27 LAB — GLUCOSE, CAPILLARY
Glucose-Capillary: 120 mg/dL — ABNORMAL HIGH (ref 70–99)
Glucose-Capillary: 148 mg/dL — ABNORMAL HIGH (ref 70–99)
Glucose-Capillary: 149 mg/dL — ABNORMAL HIGH (ref 70–99)
Glucose-Capillary: 154 mg/dL — ABNORMAL HIGH (ref 70–99)
Glucose-Capillary: 174 mg/dL — ABNORMAL HIGH (ref 70–99)
Glucose-Capillary: 175 mg/dL — ABNORMAL HIGH (ref 70–99)

## 2020-09-27 LAB — MAGNESIUM
Magnesium: 2.1 mg/dL (ref 1.7–2.4)
Magnesium: 2.1 mg/dL (ref 1.7–2.4)

## 2020-09-27 LAB — PHOSPHORUS
Phosphorus: 1.6 mg/dL — ABNORMAL LOW (ref 2.5–4.6)
Phosphorus: 2.5 mg/dL (ref 2.5–4.6)

## 2020-09-27 MED ORDER — AMANTADINE HCL 100 MG PO CAPS
100.0000 mg | ORAL_CAPSULE | Freq: Two times a day (BID) | ORAL | Status: DC
  Administered 2020-09-27 – 2020-09-28 (×3): 100 mg
  Filled 2020-09-27 (×3): qty 1

## 2020-09-27 MED ORDER — LABETALOL HCL 5 MG/ML IV SOLN
20.0000 mg | INTRAVENOUS | Status: DC | PRN
  Administered 2020-10-01 – 2020-10-06 (×4): 20 mg via INTRAVENOUS
  Filled 2020-09-27 (×5): qty 4

## 2020-09-27 MED ORDER — DILTIAZEM 12 MG/ML ORAL SUSPENSION
30.0000 mg | Freq: Four times a day (QID) | ORAL | Status: DC
  Administered 2020-09-27 – 2020-10-19 (×80): 30 mg
  Filled 2020-09-27 (×13): qty 3
  Filled 2020-09-27: qty 6
  Filled 2020-09-27 (×21): qty 3
  Filled 2020-09-27: qty 6
  Filled 2020-09-27 (×37): qty 3
  Filled 2020-09-27: qty 6
  Filled 2020-09-27 (×17): qty 3
  Filled 2020-09-27: qty 6
  Filled 2020-09-27 (×4): qty 3

## 2020-09-27 MED ORDER — CHLORHEXIDINE GLUCONATE 0.12 % MT SOLN
15.0000 mL | Freq: Two times a day (BID) | OROMUCOSAL | Status: DC
  Administered 2020-09-28 – 2020-10-01 (×8): 15 mL via OROMUCOSAL
  Filled 2020-09-27 (×6): qty 15

## 2020-09-27 MED ORDER — ORAL CARE MOUTH RINSE
15.0000 mL | Freq: Two times a day (BID) | OROMUCOSAL | Status: DC
  Administered 2020-09-28 – 2020-10-01 (×8): 15 mL via OROMUCOSAL

## 2020-09-27 MED ORDER — HYDRALAZINE HCL 20 MG/ML IJ SOLN
20.0000 mg | Freq: Four times a day (QID) | INTRAMUSCULAR | Status: DC | PRN
  Administered 2020-10-04: 20 mg via INTRAVENOUS
  Filled 2020-09-27: qty 1

## 2020-09-27 NOTE — Progress Notes (Signed)
OT Cancellation Note  Patient Details Name: RAMAL ECKHARDT MRN: [id number] DOB: 05/12/1956   Cancelled Treatment:    Reason Eval/Treat Not Completed: Active bedrest order;Other (comment) (awaiting ventriculostomy)  Wynona Neat, OTR/L  Acute Rehabilitation Services Pager: [phone] Office: [phone] .  09/27/2020, 8:53 AM

## 2020-09-27 NOTE — Progress Notes (Signed)
NIR.  History of acute intraparenchymal hemorrhage (dorsal left pons with subarachnoid extension into the cerebral aqueduct and fourth ventricle) s/p diagnostic cerebral arteriogram revealing no evidence of aneurysm, AVM, dural AV fistula, or other vascular abnormality 09/26/2020 by Dr. Tommie Sams.  Plan to follow-up with Dr. Tommie Sams in clinic 4 weeks after discharge (NIR schedulers to call patient to set up this appointment). Further plans per neurology/CCM/neurosurgery- appreciate and agree with management. Please call NIR with questions/concerns.   Waylan Boga Marketia Stallsmith, PA-C 09/27/2020, 10:54 AM

## 2020-09-27 NOTE — Procedures (Signed)
Cortrak  Person Inserting Tube:  Bay Jarquin E, RD Tube Type:  Cortrak - 43 inches Tube Location:  Left nare Initial Placement:  Stomach Secured by: Bridle Technique Used to Measure Tube Placement:  Documented cm marking at nare/ corner of mouth Cortrak Secured At:  70 cm   Cortrak Tube Team Note:  Consult received to place a Cortrak feeding tube.   No x-ray is required. RN may begin using tube.   If the tube becomes dislodged please keep the tube and contact the Cortrak team at [email or web address] ([email or web address]) for replacement.  If after hours and replacement cannot be delayed, place a NG tube and confirm placement with an abdominal x-ray.    Glynna Failla, MS, RD, LDN RD pager number and weekend/on-call pager number located in Amion.    

## 2020-09-27 NOTE — Progress Notes (Addendum)
STROKE TEAM PROGRESS NOTE   INTERVAL HISTORY Yesterday underwent diagnostic Cerebral Angiogram which revealed no abnormalities. Overnight had an EVD placed by neurosurgery, tolerated procedure well.  His wife is at the bedside.  Today after noxious stimuli (sternal rub) he was able to open his eyes and follow commands. He is still on 3% NaCl.  Serum sodium is at goal at 151 this morning.  His EVD is draining and pop off pressure is 10.  Blood pressure is adequately controlled.  He has low-grade fever today.  [facility]eroSurgery, IR, and CCM are following. Vitals:   09/27/20 0500 09/27/20 0600 09/27/20 0700 09/27/20 0737  BP: 137/71 (!) 144/71 (!) 145/67   Pulse: 74 70 76   Resp: 20 20 20 20   Temp:      TempSrc:      SpO2: 94% 96% 94% 97%  Weight:      Height:       CBC:  Recent Labs  Lab 09/25/20 0309 09/25/20 0328 09/25/20 0501  WBC 6.7  --   --   NEUTROABS 2.2  --   --   HGB 15.3 15.6 15.0  HCT 45.7 46.0 44.0  MCV 93.5  --   --   PLT 214  --   --    Basic Metabolic Panel:  Recent Labs  Lab 09/25/20 0309 09/25/20 0328 09/25/20 0501 09/25/20 0615 09/26/20 1631 09/26/20 2312  NA 137 140 139   < > 149* 151*  K 3.6 3.6 4.2  --   --   --   CL 104 102  --   --   --   --   CO2 25  --   --   --   --   --   GLUCOSE 169* 170*  --   --   --   --   BUN 15 18  --   --   --   --   CREATININE 1.00 0.90  --   --   --   --   CALCIUM 8.8*  --   --   --   --   --   MG  --   --   --   --  1.9  --   PHOS  --   --   --   --  2.0*  --    < > = values in this interval not displayed.   Lipid Panel:  Recent Labs  Lab 09/25/20 1659  CHOL 155  TRIG 117  HDL 44  CHOLHDL 3.5  VLDL 23  LDLCALC 88   HgbA1c:  Recent Labs  Lab 09/25/20 0309  HGBA1C 5.7*   Urine Drug Screen:  Recent Labs  Lab 09/25/20 0304  LABOPIA NONE DETECTED  COCAINSCRNUR NONE DETECTED  LABBENZ NONE DETECTED  AMPHETMU NONE DETECTED  THCU NONE DETECTED  LABBARB NONE DETECTED    Alcohol Level  Recent Labs   Lab 09/25/20 0309  ETH <10    IMAGING past 24 hours No results found.  PHYSICAL EXAM Blood pressure (!) 145/67, pulse 76, temperature 100.1 F (37.8 C), temperature source Axillary, resp. rate 20, height 6\' 2"  (1.88 m), weight 100 kg, SpO2 97 %.  General: intubated, drowsy but arousable, elderly caucasian male, no apparent distress  Lungs: Symmetrical Chest rise, no labored breathing  Cardio: Regular Rate and Rhythm  Abdomen: Soft, non-tender  Neuro: intubated, Drowsy but arousable, Able to follow commands without difficulty. Cranial Nerves: II:  pupils equal, round, reactive to light and  accommodation III,IV, VI: ptosis not present, extra-ocular motions intact bilaterally V,VII: face is symmetric VIII: hearing normal bilaterally XII: midline tongue extension without atrophy or fasciculations  Motor: Right : Upper extremity   0/5   Left:     Upper extremity   3/5 with         increase in tone  Lower extremity   3+/5   Lower extremity   2+/5  Tone and bulk:normal tone throughout; no atrophy noted   ASSESSMENT/PLAN [name] is a 65 y.o. male with history of atrial flutter newly diagnosed with atrial fibrillation in February not on anticoagulation,follows with Dr. Johney Frame in cardiology,presenting with Right-sided weakness, slurred speech, headache, vomiting.Last known well when he went to bed at 10 PM on 09/24/2020 and woke up at around 2 AM this morning and complained to his wife that he was having some discomfort in his head or ear. She was unable to understand him well and thought his speech was slurred. EMS was called.He was very drowsy and sleepy upon their assessment and was weaker on the right side in comparison to the left. His vitals were not too abnormal-systolic blood pressures were in the 140- 150 range on scene.   Underwent diagnostic Cerebral angiogram which did not reveal any abnormalities. Underwent EVD placement yesterday and tolerated the  procedure well. He was able to be aroused with noxious stimuli (sternal rub), opened his eyes and followed commands. Did develop fever overnight which will be managed by CCM. Possible to extubate now he is alert and able to follow commands. CCM will challenge him and make final decision. Will continue to monitor.   Left Dorsal midbrain intraparenchymal Hemorrhage extending into dorsal pons and inferior thalamus with mild mass-effect on third ventricle and hydrocephalus source unknown-suspect cavernoma  Code Stroke CT head-Acute intraparenchymal hemorrhage in the dorsal left pons with subarachnoid extension into the cerebral aqueduct and fourth ventricle.  CTA head & neck-No vascular malformation, anomaly or aneurysm. No emergent large vessel occlusion or high-grade stenosis of the intracranial arteries. Unchanged size of dorsal left pontine hemorrhage with intraventricular extension.  MRI& MR Venogram- Acute hemorrhage centered within the left dorsal midbrain extending into the dorsal pons and inferomedial thalamus with intraventricular extension. No abnormal enhancement to suggest underlying lesion. Compression of the cerebral aqueduct with possible mild obstructive hydrocephalus. No evidence of dural sinus thrombosis. Attenuation of the left basal vein in the quadrigeminal cistern without definite occlusion.  CT Head W/O Contrast 3/29 0250 - Unchanged left brainstem hemorrhage extending into the fourth ventricle. Worsening hydrocephalus.  Diagnostic Cerebral Angiogram - No evidence of an aneurysm, AVM, dural AV fistula or other vascular abnormality to explain patient's known intraparenchymal hemorrhage.  2D Echo- EF:55-60%. No wall motion abnormalities.  LDL - 88  HgbA1c5.7  VTE prophylaxis -SCD's       Diet   Diet NPO time specified         No antithromboticprior to admission, now on No antithromboticat this time due to acute hemorrhage.  Therapy  recommendations:Pending  Disposition:Pending  Hypertension  Home meds:Diltiazem  Stable  Currently on Cleviprex will wean  Currently on Labetalol 20 mg q2 PRN Systolic > 160  Currently on Hydralazine 20 mg q6 PRN Systolic > 160  BP goal Systolic > 160  Long-term BP goal normotensive  Hyperlipidemia  Home meds:none  LDL 88, goal < 70  Addstatin tomorrow after hemorrhage is stable  High intensity statinAtorvastatin  Continue statin at discharge  Cerebral Edema  Currently  on 3% NaCl at 60 cc/hr  Na goal is 150-155  0107 Na - 151  EVD placed, pop off pressure is currently 10  Drowsiness  Will start Amantadine 100 mg BID    Other Stroke Risk Factors  Advanced Age >/= 38    Hospital day # 2  Arna Snipe MD Resident  I have personally obtained history,examined this patient, reviewed notes, independently viewed imaging studies, participated in medical decision making and plan of care.ROS completed by me personally and pertinent positives fully documented  I have made any additions or clarifications directly to the above note. Agree with note above.  Patient neurological exam shows some wakefulness today and he seems to be following commands well with his eye movements as well as moving all 4 extremities purposefully to command except the right upper extremity.  Plan continue to hold sedation to see if patient is awake enough to consider extubation.  Continue ventricular drainage as per neurosurgery and hypertonic saline with serum sodium goal 150-155.  Keep systolic blood pressure below 956 and used as needed IV labetalol and hydralazine and wean Cleviprex drip.  Evaluation and treatment for low-grade fever and extubation as per critical care team.  Long discussion with patient's wife at the bedside and with Dr. Isaiah Serge critical care medicine. This patient is critically ill and at significant risk of neurological worsening, death and care requires  constant monitoring of vital signs, hemodynamics,respiratory and cardiac monitoring, extensive review of multiple databases, frequent neurological assessment, discussion with family, other specialists and medical decision making of high complexity.I have made any additions or clarifications directly to the above note.This critical care time does not reflect procedure time, or teaching time or supervisory time of PA/NP/Med Resident etc but could involve care discussion time.  I spent 35 minutes of neurocritical care time  in the care of  this patient.       Delia Heady, MD Medical Director [facility] Pager: [phone] 09/27/2020 3:56 PM  To contact Stroke Continuity provider, please refer to [email or web address]. After hours, contact General Neurology

## 2020-09-27 NOTE — Progress Notes (Signed)
Patient ID: Walter [name], male   DOB: 04-17-1956, 65 y.o.   MRN: [id number] Patient having low-grade temp Sedation is decreased at this point IVC is draining nicely For CT this morning

## 2020-09-27 NOTE — Progress Notes (Addendum)
NAME:  Walter [name], MRN:  [id number], DOB:  04-16-1956, LOS: 2 ADMISSION DATE:  09/25/2020, CONSULTATION DATE:  09/25/20 REFERRING MD:  Wilford Corner,  CHIEF COMPLAINT:  Headache, r side weakness, slurred speech  History of Present Illness:  Walter [name] is a 65 yo man with hx of afib not on anticoagulation admitted with right-sided weakness, hemorrhagic stroke.  Emergently intubated in the ED for airway protection  Pertinent  Medical History  Aflutter/afib (not on ac)  S/p Ablation 03/2020, hx of Cardioversion 2015 Hx retinal detachment Covid infection in July 2021 treated with monoclonal antibody Hx CV  Meds: vitamin C, Vit D, vit E, probiotic, Diltiazem 120 daily, epinephrine prn for anaphylaxis   Significant Hospital Events: Including procedures, antibiotic start and stop dates in addition to other pertinent events   . 3/28- Admit, intubated.  Not a candidate for TPA due to intracranial hemorrhage.  Started 3% saline . 3/29- EVD placement for developing hydrocephlaus CTA of the head was unremarkable  Interim History / Subjective:   Underwent EVD placement for developing hydrocephalus.  Remains intubated Febrile  Objective   Blood pressure 130/60, pulse 71, temperature (!) 101.3 F (38.5 C), temperature source Axillary, resp. rate 20, height 6\' 2"  (1.88 m), weight 100 kg, SpO2 94 %.    Vent Mode: PRVC FiO2 (%):  [30 %-100 %] 40 % Set Rate:  [20 bmp] 20 bmp Vt Set:  [650 mL] 650 mL PEEP:  [5 cmH20] 5 cmH20 Plateau Pressure:  [16 cmH20] 16 cmH20   Intake/Output Summary (Last 24 hours) at 09/27/2020 0859 Last data filed at 09/27/2020 0811 Gross per 24 hour  Intake 2503.25 ml  Output 1290 ml  Net 1213.25 ml   Filed Weights   09/25/20 0334 09/25/20 0524 09/27/20 0429  Weight: 107.7 kg 101 kg 100 kg    Examination: Blood pressure 130/60, pulse 71, temperature (!) 101.3 F (38.5 C), temperature source Axillary, resp. rate 20, height 6\' 2"  (1.88 m), weight 100 kg, SpO2 94  %. Gen:      No acute distress HEENT:  EOMI, sclera anicteric Neck:     No masses; no thyromegaly, ETT Lungs:    Clear to auscultation bilaterally; normal respiratory effort CV:         Regular rate and rhythm; no murmurs Abd:      + bowel sounds; soft, non-tender; no palpable masses, no distension Ext:    No edema; adequate peripheral perfusion Skin:      Warm and dry; no rash Neuro: Unresponsive   Labs/imaging that I havepersonally reviewed  (right click and "Reselect all SmartList Selections" daily)   Sodium 151, WBC 6.7, platelets 214 No new imaging  Resolved Hospital Problem list     Assessment & Plan:  Acute hemorrhagic stroke CT brain and MRI reviewed with intraparenchymal hemorrhage with local mass-effect on brainstem developing hydrocephalus s/p EVD Continue 3% saline Cleveprix for SBP goal < 160 Management per neurology  Acute respiratory failure due to stroke Intubated for airway protection Pressure support weans as tolerated.  Mental status is a barrier to extubation Check trach aspirate for fevers  Atrial fibrillation, not on anticoagulation as an outpatient Rate control  Best practice (right click and "Reselect all SmartList Selections" daily)   Best Practice (right click and "Reselect all SmartList Selections" daily)   Diet:  Tube Feed  If oral type of diet:  Pain/Anxiety/Delirium protocol (if indicated): Yes (RASS goal -1) VAP protocol (if indicated): Yes DVT prophylaxis: SCD GI prophylaxis: PPI  Glucose control:  SSI Yes Central venous access:  N/A Arterial line:  N/A Foley:  N/A Restraints ACTION; IS/IS NOT: is not needed Mobility:  bed rest  PT consulted: N/A Studies pending: None Culture data pending: none Last reviewed culture data:today Antibiotics:None  Daily labs: requested Code Status:  full code Prognosis: Serious Last date of multidisciplinary goals of care discussion []  Icu pulm disposition: Unchanged.   Critical care time:     The patient is critically ill with multiple organ system failure and requires high complexity decision making for assessment and support, frequent evaluation and titration of therapies, advanced monitoring, review of radiographic studies and interpretation of complex data.   Critical Care Time devoted to patient care services, exclusive of separately billable procedures, described in this note is 35 minutes.   MD [facility] See Amion for pager  If no response to pager , please call [phone] until 7pm After 7:00 pm call Elink  [phone] 09/27/2020, 8:59 AM

## 2020-09-27 NOTE — Progress Notes (Signed)
Sodium is 155. VO for Dr Pearlean Brownie to turn off 3% saline. Continue q 6 serum sodiums. Restart 3% at 60 cc/hr if serum sodium falls below 150.

## 2020-09-27 NOTE — Procedures (Signed)
Extubation Procedure Note  Patient Details:   Name: MONTRICE GRACEY DOB: [birth year] MRN: [id number]   Airway Documentation:    Vent end date: 09/27/20 Vent end time: 1723   Evaluation  O2 sats: stable throughout Complications: No apparent complications Patient did tolerate procedure well. Bilateral Breath Sounds: Rhonchi,Diminished   Yes, pt could speak post extubation.  Pt extubated to 2 l/m [place] per physician order.  Audrie Lia 09/27/2020, 5:24 PM

## 2020-09-27 NOTE — Progress Notes (Signed)
PCCM note  Patient had transient afib back to NSR Home dilt has been restarted. No anticoagulation due to ICH  He is awake and responsive. Has secretions but good cough and ability to clear airway Order placed for extubation.  Discussed with Dr. Daphene Jaeger MD [facility] 09/27/2020, 5:22 PM

## 2020-09-27 NOTE — Progress Notes (Signed)
SLP Cancellation Note  Patient Details Name: Walter [name] MRN: [id number] DOB: 04/09/1956   Cancelled treatment:       Reason Eval/Treat Not Completed: Patient not medically ready   Darik Massing, Riley Nearing 09/27/2020, 10:09 AM

## 2020-09-27 NOTE — Progress Notes (Signed)
Sodium from 0600 has not resulted. I cannot confirm that it has been collected. No answer at lab. Stat sodium ordered, MD notified.

## 2020-09-27 NOTE — Progress Notes (Signed)
Sodium 153 per Velna Hatchet in lab. Next will be drawn at 1600.

## 2020-09-27 NOTE — Progress Notes (Signed)
Pt flipped into a fib HR 120-130s. MD notified. Order to restart home dilt. Per pharmacy, will give 30 q 6 hr.

## 2020-09-27 NOTE — Progress Notes (Signed)
PT Cancellation Note  Patient Details Name: Walter [name] MRN: [id number] DOB: 10/18/1955   Cancelled Treatment:    Reason Eval/Treat Not Completed: Active bedrest order. Will follow-up as appropriate another day.   Raymond Gurney, PT, DPT Acute Rehabilitation Services  Pager: [phone] Office: [phone]    Jewel Baize 09/27/2020, 9:02 AM

## 2020-09-27 NOTE — Procedures (Signed)
Walter [name] is a 65 year old individual who sustained a hemorrhagic stroke in the dorsal midbrain.  He was found to be developing hydrocephalus.  He was advised that he undergo placement of a ventriculostomy catheter.  Procedure the patient was in the hospital bed and was raised to the semiseated position.  The head was shaved in the right frontal region.  Scalp was prepped with alcohol and then DuraPrep.  He was draped sterilely.  An area 2 cm lateral to the midline just anterior to the coronal suture was chosen and infiltrated with 7 cc of lidocaine 1.5% without epinephrine.  A small vertical incision measuring 2 cm was made in this region.  Self-retaining retractor was placed in the wound and the cranium was identified.  A twist drill bur hole was created in this region and the inner table of the skull was perforated.  The dura was identified.  This was perforated with an 18-gauge spinal needle.  Then a ventriculostomy catheter was passed to a depth of 7 cm in CSF was obtained.  The CSF initially appeared slightly bloody.  The catheter was tunneled to a area 3 cm away from the initial incision and brought out through separate stab incision.  Then the initial incision was closed with a running 4-0 nylon suture.  The distal portion of the drain was secured with another 4-0 nylon suture also.  The distal catheter was then connected to a collecting system for monitoring pressure and measuring the amount of drainage.  A dry sterile dressing was then applied to the scalp.

## 2020-09-28 DIAGNOSIS — I613 Nontraumatic intracerebral hemorrhage in brain stem: Secondary | ICD-10-CM | POA: Diagnosis not present

## 2020-09-28 DIAGNOSIS — I480 Paroxysmal atrial fibrillation: Secondary | ICD-10-CM | POA: Diagnosis not present

## 2020-09-28 LAB — BASIC METABOLIC PANEL
Anion gap: 3 — ABNORMAL LOW (ref 5–15)
BUN: 11 mg/dL (ref 8–23)
CO2: 27 mmol/L (ref 22–32)
Calcium: 8.8 mg/dL — ABNORMAL LOW (ref 8.9–10.3)
Chloride: 122 mmol/L — ABNORMAL HIGH (ref 98–111)
Creatinine, Ser: 0.75 mg/dL (ref 0.61–1.24)
GFR, Estimated: >60 mL/min (ref >60)
Glucose, Bld: 135 mg/dL — ABNORMAL HIGH (ref 70–99)
Potassium: 3.7 mmol/L (ref 3.5–5.1)
Sodium: 152 mmol/L — ABNORMAL HIGH (ref 135–145)

## 2020-09-28 LAB — GLUCOSE, CAPILLARY
Glucose-Capillary: 153 mg/dL — ABNORMAL HIGH (ref 70–99)
Glucose-Capillary: 158 mg/dL — ABNORMAL HIGH (ref 70–99)
Glucose-Capillary: 144 mg/dL — ABNORMAL HIGH (ref 70–99)
Glucose-Capillary: 164 mg/dL — ABNORMAL HIGH (ref 70–99)
Glucose-Capillary: 123 mg/dL — ABNORMAL HIGH (ref 70–99)
Glucose-Capillary: 133 mg/dL — ABNORMAL HIGH (ref 70–99)

## 2020-09-28 LAB — PHOSPHORUS: Phosphorus: 3.3 mg/dL (ref 2.5–4.6)

## 2020-09-28 LAB — MAGNESIUM: Magnesium: 2.1 mg/dL (ref 1.7–2.4)

## 2020-09-28 LAB — SODIUM
Sodium: 151 mmol/L — ABNORMAL HIGH (ref 135–145)
Sodium: 151 mmol/L — ABNORMAL HIGH (ref 135–145)
Sodium: 151 mmol/L — ABNORMAL HIGH (ref 135–145)

## 2020-09-28 LAB — CBC
HCT: 40.1 % (ref 39.0–52.0)
Hemoglobin: 12.7 g/dL — ABNORMAL LOW (ref 13.0–17.0)
MCH: 31.5 pg (ref 26.0–34.0)
MCHC: 31.7 g/dL (ref 30.0–36.0)
MCV: 99.5 fL (ref 80.0–100.0)
Platelets: UNDETERMINED 10*3/uL (ref 150–400)
RBC: 4.03 MIL/uL — ABNORMAL LOW (ref 4.22–5.81)
RDW: 14 % (ref 11.5–15.5)
WBC: 11.8 10*3/uL — ABNORMAL HIGH (ref 4.0–10.5)
nRBC: 0 % (ref 0.0–0.2)

## 2020-09-28 MED ORDER — ASPIRIN 81 MG PO CHEW
81.0000 mg | CHEWABLE_TABLET | Freq: Every day | ORAL | Status: DC
  Filled 2020-09-28: qty 1

## 2020-09-28 MED ORDER — ENOXAPARIN SODIUM 40 MG/0.4ML ~~LOC~~ SOLN
40.0000 mg | SUBCUTANEOUS | Status: DC
  Administered 2020-09-28 – 2020-10-02 (×5): 40 mg via SUBCUTANEOUS
  Filled 2020-09-28 (×5): qty 0.4

## 2020-09-28 MED ORDER — ASPIRIN 81 MG PO CHEW
81.0000 mg | CHEWABLE_TABLET | Freq: Every day | ORAL | Status: DC
  Administered 2020-09-28 – 2020-10-05 (×8): 81 mg via NASOGASTRIC
  Filled 2020-09-28 (×7): qty 1

## 2020-09-28 MED ORDER — AMANTADINE HCL 50 MG/5ML PO SOLN
100.0000 mg | Freq: Two times a day (BID) | ORAL | Status: DC
  Administered 2020-09-28 – 2020-10-02 (×9): 100 mg
  Filled 2020-09-28 (×11): qty 10

## 2020-09-28 NOTE — Progress Notes (Signed)
SLP Cancellation Note  Patient Details Name: Walter [name] MRN: [id number] DOB: [birth year]   Cancelled treatment:       Reason Eval/Treat Not Completed: Patient's level of consciousness. Now extubated but very lethargic per RN. Will continue f/u for improved arousal for participation in cognitive/linguistic eval. Will likely need swallow eval when ready.    Maanya Hippert, Riley Nearing 09/28/2020, 9:23 AM

## 2020-09-28 NOTE — Progress Notes (Signed)
RN spoke with Dr. Isaiah Serge about foley.  No indications to keep foley at this time.  Dr. Isaiah Serge OK to remove Foley and to keep updated if pt is having retention.  Foley removed at this time.

## 2020-09-28 NOTE — Evaluation (Signed)
Occupational Therapy Evaluation Patient Details Name: Walter [name] MRN: [id number] DOB: 11-19-1955 Today's Date: 09/28/2020    History of Present Illness 65 yo male presenting 3/28 with R-sided weakness and slurred speech. Imaging revealed acute hemorrhage centered within the left dorsal midbrain extending into the dorsal pons and inferomedial thalamus with intraventricular extension. EVD placement for developing hydrocephalus on 3/29. Extubated 3/30. PMH Afib, COVID infection (July 2021), and retinal detachment.   Clinical Impression   PTA, pt was living with his wife and was independent. Pt currently requiring Mod A for UB ADLs, Max A for LB ADLs, and Mod-Max A +2 for functional mobility. Pt very motivated to participate in therapy despite fatigue and lethargy. Pt benefiting from simple, direct cues and increased time. VSS on RA initially, desat to 86% (questionable plenth) so pt placed back on 2L O2. Pt will require further acute OT to facilitate safe dc and optimize functional performance. Due to pt's high motivation, age, and PLOF, recommend dc to CIR for intensive OT to optimize safety, independence with ADLs, and return to PLOF.     Follow Up Recommendations  CIR    Equipment Recommendations  Other (comment) (Defer to next venue)    Recommendations for Other Services PT consult;Speech consult;Rehab consult     Precautions / Restrictions Precautions Precautions: Fall Precaution Comments: EVD (clamped during session) Restrictions Weight Bearing Restrictions: No      Mobility Bed Mobility Overal bed mobility: Needs Assistance Bed Mobility: Supine to Sit;Sit to Supine     Supine to sit: Mod assist;+2 for physical assistance;+2 for safety/equipment Sit to supine: Max assist;+2 for physical assistance;+2 for safety/equipment   General bed mobility comments: modA to pull to sitting EOB, pt able to initiate movement with BLE, but modA at trunk to pull from [facility]. min/modA  initially to maintain seated stability. maxA to return to supine with assist at trunk and BLE, line management    Transfers Overall transfer level: Needs assistance Equipment used: 2 person hand held assist Transfers: Sit to/from Stand Sit to Stand: Mod assist;+2 physical assistance;+2 safety/equipment         General transfer comment: modA through HHA with blocking of bilateral knees (more buckling RLE than LLE). cues for posture and upright position, but pt with good power in BLE to rise to standing.    Balance Overall balance assessment: Needs assistance Sitting-balance support: No upper extremity supported;Feet supported Sitting balance-Leahy Scale: Poor Sitting balance - Comments: pt able to maintain with minA-minG initially. Increased sway and wobbles in sitting but pt able to self-correct with cues and return to midline. Postural control: Posterior lean Standing balance support: Bilateral upper extremity supported Standing balance-Leahy Scale: Poor Standing balance comment: reliant on BUE support to maintain static stance and dynamic balance                           ADL either performed or assessed with clinical judgement   ADL Overall ADL's : Needs assistance/impaired Eating/Feeding: NPO   Grooming: Wash/dry face;Minimal assistance;Sitting Grooming Details (indicate cue type and reason): Pt washing his face while sitting at EOB with use of LUE. Pt with poor sitting balance during this tasks and freuqneitn switching positions requiring Min A for postural control Upper Body Bathing: Moderate assistance;Sitting   Lower Body Bathing: Moderate assistance;Bed level;Sit to/from stand   Upper Body Dressing : Moderate assistance;Sitting   Lower Body Dressing: Maximal assistance;Sit to/from stand   Toilet Transfer: Moderate assistance;+2 for  physical assistance Toilet Transfer Details (indicate cue type and reason): simulated at EOB. Mod A +2 for sit<>stand and R  knee blocked.         Functional mobility during ADLs: Moderate assistance;+2 for physical assistance;+2 for safety/equipment;Maximal assistance (sit<>stand and then side steps towards [facility]) General ADL Comments: Pt very motivated. Lethargic and keeping eye closed. Following simple, direct cues     Vision Baseline Vision/History: Wears glasses Wears Glasses: At all times Patient Visual Report: No change from baseline Vision Assessment?: Yes;Vision impaired- to be further tested in functional context Eye Alignment: Impaired (comment) (dysconjugate gaze) Additional Comments: Pt will difficulty sustaining eyes open for tracking task. Possible visual fatigue as pt unabel to hold positions outside of midline. Noting dysconjugate gaze with tracking. Pt reporting diplopia (side by side). Reporting he is R eye dominant. Will continue to assess     Perception     Praxis      Pertinent Vitals/Pain Pain Assessment: No/denies pain     Hand Dominance Right   Extremity/Trunk Assessment Upper Extremity Assessment Upper Extremity Assessment: RUE deficits/detail RUE Deficits / Details: Decreased grasp strength and gross strength compared to LUE. Noting poor coorindation. Edema at RUE. RUE Coordination: decreased fine motor;decreased gross motor   Lower Extremity Assessment Lower Extremity Assessment: Defer to PT evaluation RLE Deficits / Details: grossly 4-/5, pt able to complete SLR, LAQ, DF against mod resistance. denies difference in sensation RLE Coordination: decreased fine motor;decreased gross motor   Cervical / Trunk Assessment Cervical / Trunk Assessment: Normal   Communication Communication Communication:  (limited by lethargy)   Cognition Arousal/Alertness: Lethargic Behavior During Therapy: Flat affect Overall Cognitive Status: Difficult to assess                                 General Comments: Pt able to answer simple questions regarding career and  visitors, able to follow simple (one step direct) commands with good consistency at this time. Pt requires repeated cues to maintain eyes open through session, but remained participatory even with moments of eyes closed   General Comments  VSS on RA initially, desat to 86% (questionable plenth) so pt placed back on 2L O2    Exercises     Shoulder Instructions      Home Living Family/patient expects to be discharged to:: Private residence Living Arrangements: Spouse/significant other                               Additional Comments: Lethargy during questions      Prior Functioning/Environment Level of Independence: Independent        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        OT Problem List: Decreased strength;Decreased range of motion;Decreased activity tolerance;Impaired balance (sitting and/or standing);Decreased safety awareness;Decreased knowledge of use of DME or AE;Decreased knowledge of precautions      OT Treatment/Interventions: Self-care/ADL training;Therapeutic exercise;Energy conservation;DME and/or AE instruction;Therapeutic activities;Patient/family education    OT Goals(Current goals can be found in the care plan section) Acute Rehab OT Goals Patient Stated Goal: get out of bed OT Goal Formulation: With patient Time For Goal Achievement: 10/12/20 Potential to Achieve Goals: Good  OT Frequency: Min 2X/week   Barriers to D/C:            Co-evaluation PT/OT/SLP Co-Evaluation/Treatment: Yes Reason for Co-Treatment: For patient/therapist safety;To address functional/ADL transfers PT goals addressed  during session: Mobility/safety with mobility;Balance;Strengthening/ROM OT goals addressed during session: ADL's and self-care      AM-PAC OT "6 Clicks" Daily Activity     Outcome Measure Help from another person eating meals?: Total Help from another person taking care of personal grooming?: A Little Help from another person toileting, which  includes using toliet, bedpan, or urinal?: A Lot Help from another person bathing (including washing, rinsing, drying)?: A Lot Help from another person to put on and taking off regular upper body clothing?: A Lot Help from another person to put on and taking off regular lower body clothing?: A Lot 6 Click Score: 12   End of Session Equipment Utilized During Treatment: Gait belt;Oxygen Nurse Communication: Mobility status;Other (comment) (O2)  Activity Tolerance: Patient tolerated treatment well Patient left: in bed;with call bell/phone within reach;with bed alarm set;with SCD's reapplied  OT Visit Diagnosis: Unsteadiness on feet (R26.81);Other abnormalities of gait and mobility (R26.89);Muscle weakness (generalized) (M62.81);Hemiplegia and hemiparesis Hemiplegia - Right/Left: Right Hemiplegia - dominant/non-dominant: Dominant Hemiplegia - caused by: Cerebral infarction                Time: 1221-1246 OT Time Calculation (min): 25 min Charges:  OT General Charges $OT Visit: 1 Visit OT Evaluation $OT Eval Moderate Complexity: 1 Mod  Wyonia Fontanella MSOT, OTR/L Acute Rehab Pager: [phone] Office: [phone]  Theodoro Grist Ryder [place] 09/28/2020, 1:41 PM

## 2020-09-28 NOTE — Progress Notes (Signed)
PCCM note  Bradycardia to 40s.  BP and mental status is stable  Stop Cardizem. Will discuss with EP service.  Chilton Greathouse MD [facility] 09/28/2020, 5:34 PM

## 2020-09-28 NOTE — Consult Note (Addendum)
Cardiology Consultation:   Patient ID: Walter [name] MRN: [id number]; DOB: 12/20/1955  Admit date: 09/25/2020 Date of Consult: 09/28/2020  PCP:  Alroy Dust, L.Marlou Sa, [place]  Cardiologist:  No primary care provider on file. Electrophysiologist:  Thompson Grayer, MD  :[id number]}    Patient Profile:   Walter [name] is a 65 y.o. male with a hx of only AFlutter (ablate) and new findings of Afib who is being seen today for the evaluation of AFib at the request of Dr. Leonie Man.  History of Present Illness:   [name] last saw Dr. Rayann Heman 08/14/20, w/hx of Aflutter s/p CTI ablation 03/09/20 with c/o some palpitations noted to be primarily PACs by his Apple watch recordings.  At that visit noted new Afib on a Zio monitor woth burden of 1% (with short atrial tachycardia vs atypical atrial flutter also noted) With CHADs2Vasc score of zero, a/c notindicated, added daily diltiazem to his regime with consideration for AAd if burden increased.  He was admitted to [facility] 09/25/20 with a nondescript discomfort in his head/ear, developed lethargy, R sided weakness and slurred speech, found with a brainstem ICH/IVH, nontraumatic. 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 Atrial flutter ([place])   . Dizzy 09/25/13  . Near syncope 09/25/13    Past Surgical History:  Procedure Laterality Date  . A-FLUTTER ABLATION N/A 03/09/2020   Procedure: A-FLUTTER ABLATION;  Surgeon: Thompson Grayer, MD;   Location: [place] CV LAB;  Service: Cardiovascular;  Laterality: N/A;  . CARDIOVERSION N/A 09/27/2013   Procedure: CARDIOVERSION;  Surgeon: Lelon Perla, MD;  Location: [facility] ENDOSCOPY;  Service: Cardiovascular;  Laterality: N/A;  . IR ANGIO EXTERNAL CAROTID SEL EXT CAROTID UNI R MOD SED  09/26/2020  . IR ANGIO INTRA EXTRACRAN SEL COM CAROTID INNOMINATE BILAT MOD SED  09/26/2020  . IR ANGIO VERTEBRAL SEL VERTEBRAL UNI R MOD SED  09/26/2020  . IR US GUIDE VASC ACCESS RIGHT  09/26/2020  . RETINAL DETACHMENT SURGERY    . right knee arthroscopy    . TEE WITHOUT CARDIOVERSION N/A 09/27/2013   Procedure: TRANSESOPHAGEAL ECHOCARDIOGRAM (TEE);  Surgeon: Lelon Perla, MD;  Location: [facility] ENDOSCOPY;  Service: Cardiovascular;  Laterality: N/A;  . WRIST SURGERY       Home Medications:  Prior to Admission medications   Medication Sig Start Date End Date Taking? Authorizing Provider  Bioflavonoid Products (ESTER-C) 500-550 MG TABS Take 1 tablet by mouth daily.   Yes [provider]  cholecalciferol (VITAMIN D3) 25 MCG (1000 UNIT) tablet Take 1,000 Units by mouth daily.   Yes [provider]  diltiazem (CARDIZEM CD) 120 MG 24 hr capsule Take 1 capsule (120 mg total) by mouth daily. 08/21/20  Yes Leslee Suire, Jeneen Rinks, MD  EPINEPHrine 0.3 mg/0.3 mL IJ SOAJ injection Inject 0.3 mg into the muscle once as needed for anaphylaxis. 02/07/20  Yes [provider]  diltiazem (CARDIZEM) 30 MG tablet Take 1 tablet (30 mg total) by mouth daily as needed (1-2 tablets every 4-6 hrs for fast heart rate.).  Patient not taking: No sig reported 07/03/20   Thompson Grayer, MD    Inpatient Medications: Scheduled Meds: . amantadine  100 mg Per Tube BID  . aspirin  81 mg Per NG tube Daily  . chlorhexidine  15 mL Mouth Rinse BID  . Chlorhexidine Gluconate Cloth  6 each Topical Daily  . diltiazem  30 mg Per Tube Q6H  . enoxaparin (LOVENOX) injection  40 mg Subcutaneous Q24H  . feeding supplement (PROSource TF)   45 mL Per Tube BID  . insulin aspart  0-9 Units Subcutaneous TID WC  . mouth rinse  15 mL Mouth Rinse q12n4p  . pantoprazole sodium  40 mg Per Tube QHS  . senna-docusate  1 tablet Per Tube BID   Continuous Infusions: . feeding supplement (VITAL 1.5 CAL) 1,000 mL (09/28/20 0628)   PRN Meds: acetaminophen **OR** acetaminophen (TYLENOL) oral liquid 160 mg/5 mL **OR** acetaminophen, fentaNYL (SUBLIMAZE) injection, hydrALAZINE, labetalol  Allergies:    Allergies  Allergen Reactions  . Latex Itching    Skin turns real red    Social History:   Social History   Socioeconomic History  . Marital status: Married    Spouse name: Not on file  . Number of children: Not on file  . Years of education: Not on file  . Highest education level: Not on file  Occupational History  . Not on file  Tobacco Use  . Smoking status: Never Smoker  . Smokeless tobacco: Never Used  Substance and Sexual Activity  . Alcohol use: Yes    Comment: occasionally  . Drug use: No  . Sexual activity: Not on file  Other Topics Concern  . Not on file  Social History Narrative   Pt lives in [place].  Works as a Marine scientist for [employer].   Married. 2 children   Social Determinants of Radio broadcast assistant Strain: Not on file  Food Insecurity: Not on file  Transportation Needs: Not on file  Physical Activity: Not on file  Stress: Not on file  Social Connections: Not on file  Intimate Partner Violence: Not on file    Family History:   Family History  Problem Relation Age of Onset  . Heart disease Mother   . Heart disease Father   . Healthy Sister   . Healthy Brother      ROS:  Unable to obtain     Physical Exam/Data:   Vitals:   09/28/20 0900 09/28/20 1000 09/28/20 1100 09/28/20 1200  BP: 131/62 (!) 146/60 135/65 130/61  Pulse: 74 (!) 34 (!) 39 (!) 54  Resp: '15 16 14 20  ' Temp:    99.2 F (37.3 C)  TempSrc:    Axillary  SpO2: 96% 94% 96% 96%  Weight:      Height:         Intake/Output Summary (Last 24 hours) at 09/28/2020 1405 Last data filed at 09/28/2020 1200 Gross per 24 hour  Intake 2194.14 ml  Output 1360 ml  Net 834.14 ml   Last 3 Weights 09/27/2020 09/25/2020 09/25/2020  Weight (lbs) 220 lb 7.4 oz 222 lb 10.6 oz 237 lb 7 oz  Weight (kg) 100 kg 101 kg 107.7 kg     Body mass index is 28.31 kg/m.  General:  Well nourished, well developed, resting comfortably, wakes to verbal HEENT: normal Lymph: no adenopathy Neck: no JVD Endocrine:  No thryomegaly Vascular: No carotid bruits Cardiac:  RRR; no murmurs, gallops or rubs Lungs:  CTA b/l (  ant/lat auscultation only), no wheezing, rhonchi or rales  Abd: soft, nontender Ext: no edema Musculoskeletal:  No deformities Skin: warm and dry  Neuro:  Wakes to verbal, follows commands, shakes head "no" to pain Psych:  Unable to assess  EKG:  The EKG was personally reviewed and demonstrates:    09/25/20 SR 64bpm, no ST/T changes  Telemetry:  Telemetry was personally reviewed and demonstrates:   SR, SB, intermittently with increased frequency of PACs, yesterday developed short episodes of rate controlled PAFib, longest I saw about 30 minutes, other quite brief 1-2 minutes or less  Relevant CV Studies:  09/25/20: TTE IMPRESSIONS  1. Left ventricular ejection fraction, by estimation, is 55 to 60%. The  left ventricle has normal function. The left ventricle has no regional  wall motion abnormalities. Left ventricular diastolic parameters are  consistent with Grade I diastolic  dysfunction (impaired relaxation).  2. Right ventricular systolic function is normal. The right ventricular  size is normal. There is normal pulmonary artery systolic pressure. The  estimated right ventricular systolic pressure is 03.4 mmHg.  3. The mitral valve is normal in structure. No evidence of mitral valve  regurgitation. No evidence of mitral stenosis.  4. The aortic valve is tricuspid. Aortic valve regurgitation is  not  visualized. No aortic stenosis is present.  5. The inferior vena cava is dilated in size with <50% respiratory  variability, suggesting right atrial pressure of 15 mmHg.   Laboratory Data:  High Sensitivity Troponin:  No results for input(s): TROPONINIHS in the last 720 hours.   Chemistry Recent Labs  Lab 09/25/20 0309 09/25/20 0328 09/25/20 0501 09/25/20 0615 09/27/20 2245 09/28/20 0512 09/28/20 1106  NA 137 140 139   < > 150* 151* 152*  K 3.6 3.6 4.2  --   --   --  3.7  CL 104 102  --   --   --   --  122*  CO2 25  --   --   --   --   --  27  GLUCOSE 169* 170*  --   --   --   --  135*  BUN 15 18  --   --   --   --  11  CREATININE 1.00 0.90  --   --   --   --  0.75  CALCIUM 8.8*  --   --   --   --   --  8.8*  GFRNONAA >60  --   --   --   --   --  >60  ANIONGAP 8  --   --   --   --   --  3*   < > = values in this interval not displayed.    Recent Labs  Lab 09/25/20 0309  PROT 6.1*  ALBUMIN 3.7  AST 15  ALT 14  ALKPHOS 51  BILITOT 0.9   Hematology Recent Labs  Lab 09/25/20 0309 09/25/20 0328 09/25/20 0501 09/28/20 1106  WBC 6.7  --   --  11.8*  RBC 4.89  --   --  4.03*  HGB 15.3 15.6 15.0 12.7*  HCT 45.7 46.0 44.0 40.1  MCV 93.5  --   --  99.5  MCH 31.3  --   --  31.5  MCHC 33.5  --   --  31.7  RDW 12.7  --   --  14.0  PLT 214  --   --  PLATELET CLUMPS NOTED ON SMEAR, UNABLE TO ESTIMATE  BNPNo results for input(s): BNP, PROBNP in the last 168 hours.  DDimer No results for input(s): DDIMER in the last 168 hours.   Radiology/Studies:  CT ANGIO HEAD W OR WO CONTRAST Result Date: 09/25/2020 CLINICAL DATA:  Right-sided weakness EXAM: CT ANGIOGRAPHY HEAD AND NECK TECHNIQUE: Multidetector CT imaging of the head and neck was performed using the standard protocol during bolus administration of intravenous contrast. Multiplanar CT image reconstructions and MIPs were obtained to evaluate the vascular anatomy. Carotid stenosis measurements (when applicable) are  obtained utilizing NASCET criteria, using the distal internal carotid diameter as the denominator. CONTRAST:  57m OMNIPAQUE IOHEXOL 350 MG/ML SOLN COMPARISON:  None. FINDINGS: CTA NECK FINDINGS SKELETON: There is no bony spinal canal stenosis. No lytic or blastic lesion. OTHER NECK: Normal pharynx, larynx and major salivary glands. No cervical lymphadenopathy. Unremarkable thyroid gland. UPPER CHEST: No pneumothorax or pleural effusion. No nodules or masses. AORTIC ARCH: There is no calcific atherosclerosis of the aortic arch. There is no aneurysm, dissection or hemodynamically significant stenosis of the visualized portion of the aorta. Conventional 3 vessel aortic branching pattern. The visualized proximal subclavian arteries are widely patent. RIGHT CAROTID SYSTEM: Normal without aneurysm, dissection or stenosis. LEFT CAROTID SYSTEM: Normal without aneurysm, dissection or stenosis. VERTEBRAL ARTERIES: Left dominant configuration. Both origins are clearly patent. There is no dissection, occlusion or flow-limiting stenosis to the skull base (V1-V3 segments). CTA HEAD FINDINGS POSTERIOR CIRCULATION: --Vertebral arteries: Normal V4 segments. --Inferior cerebellar arteries: Normal. --Basilar artery: Normal. --Superior cerebellar arteries: Normal. --Posterior cerebral arteries (PCA): Normal. ANTERIOR CIRCULATION: --Intracranial internal carotid arteries: Normal. --Anterior cerebral arteries (ACA): Normal. Both A1 segments are present. Patent anterior communicating artery (a-comm). --Middle cerebral arteries (MCA): Normal. VENOUS SINUSES: As permitted by contrast timing, patent. ANATOMIC VARIANTS: None Review of the MIP images confirms the above findings. 5 minute delay head CT shows unchanged size of dorsal left pontine hemorrhage with intraventricular extension. IMPRESSION: 1. No vascular malformation, anomaly or aneurysm. 2. No emergent large vessel occlusion or high-grade stenosis of the intracranial arteries. 3.  Unchanged size of dorsal left pontine hemorrhage with intraventricular extension. These results were called by telephone at the time of interpretation on 09/25/2020 at 3:32 am [facility] who verbally acknowledged these results. Electronically Signed   By: KUlyses JarredM.D.   On: 09/25/2020 03:33   CT Head Wo Contrast Result Date: 09/26/2020 CLINICAL DATA:  Stroke follow-up EXAM: CT HEAD WITHOUT CONTRAST TECHNIQUE: Contiguous axial images were obtained from the base of the skull through the vertex without intravenous contrast. COMPARISON:  09/25/2020 FINDINGS: Brain: Unchanged left brainstem hemorrhage extending into the fourth ventricle. Worsening hydrocephalus. Vascular: No abnormal hyperdensity of the major intracranial arteries or dural venous sinuses. No intracranial atherosclerosis. Skull: The visualized skull base, calvarium and extracranial soft tissues are normal. Sinuses/Orbits: No fluid levels or advanced mucosal thickening of the visualized paranasal sinuses. No mastoid or middle ear effusion. The orbits are normal. IMPRESSION: 1. Unchanged left brainstem hemorrhage extending into the fourth ventricle. 2. Worsening hydrocephalus. Electronically Signed   By: KUlyses JarredM.D.   On: 09/26/2020 02:48    MR BRAIN W WO CONTRAST Result Date: 09/25/2020 CLINICAL DATA:  Intracranial hemorrhage EXAM: MRI HEAD WITHOUT AND WITH CONTRAST MRV HEAD WITHOUT AND WITH CONTRAST TECHNIQUE: Multiplanar, multiecho pulse sequences of the brain and surrounding structures were obtained without and with intravenous contrast. Angiographic images of the intracranial venous structures were obtained using MRV technique without and with intravenous contrast. COMPARISON:  None.  CONTRAST:  20m GADAVIST GADOBUTROL 1 MMOL/ML IV SOLN FINDINGS: MR HEAD FINDINGS Brain: As seen on CT imaging, there is an approximately 1.4 x 0.9 x 1.8 cm area of hemorrhage centered within the left dorsal aspect of the midbrain  extending into the dorsal pons and inframedial thalamus with mild surrounding edema. Hemorrhage appears acute by signal characteristics. Intraventricular extension is noted by the cerebral aqueduct and the fourth ventricle. There is no abnormal enhancement. Compression of the cerebral aqueduct with mild prominence of third and lateral ventricles suggesting a degree of obstructive hydrocephalus. Vascular: Major vessel flow voids at the skull base are preserved. Skull and upper cervical spine: Normal marrow signal is preserved. Sinuses/Orbits: Mild mucosal thickening a. Orbits are unremarkable. Other: Sella is unremarkable.  Mastoid air cells are clear. MRV HEAD FINDINGS Superior sagittal sinus, straight sinus, vein of Galen, and internal cerebral veins are patent. There is attenuation of the left basal vein in the quadrigeminal cistern without definite occlusion. Similar appearance on prior CTA. Both transverse and sigmoid sinuses are patent. IMPRESSION: Acute hemorrhage centered within the left dorsal midbrain extending into the dorsal pons and inferomedial thalamus with intraventricular extension. No abnormal enhancement to suggest underlying lesion. Compression of the cerebral aqueduct with possible mild obstructive hydrocephalus. No evidence of dural sinus thrombosis. Attenuation of the left basal vein in the quadrigeminal cistern without definite occlusion. Electronically Signed   By: PMacy MisM.D.   On: 09/25/2020 16:43    IR [place] Vasc Access Right Result Date: 09/27/2020 INDICATION: 65year old male with past medical history significant for atrial flutter recently diagnosed with atrial fibrillation in February 2022 not on anticoagulation. He presented with right-sided weakness and altered mental status. Head CT showed a left-sided midbrain/pontine hemorrhage with intraventricular extension to the fourth ventricle. CT angiogram of the head and MRI of the brain did not reveal a source of  hemorrhage. He comes to our service today for a diagnostic cerebral angiogram to evaluate for vascular pathology that could explain intracranial hemorrhage. EXAM: BILATERAL COMMON CAROTID AND INNOMINATE ANGIOGRAPHY AND BILATERAL VERTEBRAL ARTERY ANGIOGRAMS MEDICATIONS: None. ANESTHESIA/SEDATION: Patient brought from ICU on propofol drip which was continued throughout the procedure. Additionally, 50 mcg of fentanyl were given intravenously. The patient was continuously monitored during the procedure by the interventional radiology nurse under my direct supervision. FLUOROSCOPY TIME:  Fluoroscopy Time: 7 minutes 6 seconds (992 mGy). COMPLICATIONS: None immediate. CONTRAST:  76 mL of Omnipaque 300 mg/mL. TECHNIQUE: Informed written consent was obtained from the patient's wife after a thorough discussion of the procedural risks, benefits and alternatives. All questions were addressed. Maximal Sterile Barrier Technique was utilized including caps, mask, sterile gowns, sterile gloves, sterile drape, hand hygiene and skin antiseptic. A timeout was performed prior to the initiation of the procedure. Using the modified Seldinger technique and a micropuncture kit, access was gained to the distal right radial artery at the anatomical snuffbox and a 5 French sheath was placed. Real-time ultrasound guidance was utilized for vascular access including the acquisition of a permanent ultrasound image documenting patency of the accessed vessel. Slow intra arterial infusion of 5,000 IU heparin, 5 mg Verapamil and 2557mcg nitroglicerin diluted in patient's own blood was performed. No significant fluctuation in patient's blood pressure seen. Then, a right radial artery roadmap was obtained via sheath side port. Normal brachial artery branching pattern seen. No significant anatomical variation. The right radial artery caliber is adequate for vascular access. Next, a 5 [place] 2 glide catheter was navigated over  a 0.035" Terumo  Glidewire into the right subclavian artery under fluoroscopic guidance. The catheter tip was subsequently reformed in the ascending aorta. The catheter was then placed into the left common carotid artery. Frontal and lateral angiograms of the neck were obtained followed by frontal, lateral and magnified left anterior oblique angiograms of the head. The catheter was then placed into the right common carotid artery. Frontal and lateral angiograms of the head were obtained. The catheter was subsequently advanced into the right internal carotid artery. Frontal and lateral angiograms were obtained not followed by magnified right anterior oblique and lateral views the catheter was then placed into the right external carotid artery. Frontal and lateral angiograms were obtained. The catheter was subsequently placed into the right vertebral artery. Frontal and lateral angiograms of the head were obtained followed by magnified bilateral oblique views. The catheter was subsequently withdrawn. An inflatable band was placed and inflated over the right hand access site. The vascular sheath was withdrawn and the band was slowly deflated until brisk flow was noted through the arteriotomy site. At this point, the band was reinflated with additional 2 cc of air to obtain patent hemostasis. PROCEDURE: No intervention performed. FINDINGS: Right radial artery ultrasound and right radial artery angiogram: The caliber of the distal right radial artery is appropriate for angiogram access. The right radial artery and the right ulnar artery have normal course and caliber. No significant anatomical variants noted. Left CCA angiograms: Cervical angiograms show normal course and caliber of the visualized left common carotid and internal carotid arteries. There are no significant stenoses. Left CCA angiograms-cranial views: There is brisk vascular contrast filling of the the left ACA and MCA vascular trees. There is also brisk vascular contrast  filling of the right ACA vascular tree via anterior communicating artery. Luminal caliber is smooth and tapering. No aneurysms or abnormally high-flow, early draining veins are seen. No regions of abnormal hypervascularity are noted. The visualized dural sinuses are patent. The intracranial branches of the left external carotid artery are unremarkable. Right CCA angiograms: Cervical angiograms show normal course and caliber of the visualized right common carotid and internal carotid arteries. There are no significant stenoses. Right ICA angiograms: There is brisk vascular contrast filling of the ACA and MCA vascular trees. Luminal caliber is smooth and tapering. No aneurysms or abnormally high-flow, early draining veins are seen. No regions of abnormal hypervascularity are noted. The visualized dural sinuses are patent. Filling defect within the right transverse/sigmoid junction corresponds to an arachnoid granulation. Right ECA and right occipital angiograms: No early venous drainage was noted. The intracranial branches of the right external carotid artery are unremarkable. Right vertebral artery angiograms: The right vertebral artery and bilateral posterior cerebral arteries are unremarkable. The intracranial left vertebral artery is of acid 5 by contrast reflux and also appear unremarkable. There is mild ectasia of the basilar artery, more pronounced in the proximal third. Otherwise, the basilar artery appear preserved. Luminal caliber is smooth and tapering. No aneurysms or abnormally high-flow, early draining veins are seen. No regions of abnormal hypervascularity are noted. The visualized dural sinuses are patent. IMPRESSION: 1. No evidence of an AVM, dural arteriovenous fistula, aneurysm or other vascular abnormality to explain patient's brainstem hemorrhage. 2. Repeat cerebral angiogram once hematoma has reabsorbed is suggested. Electronically Signed   By: Pedro Earls M.D.   On: 09/27/2020  09:57     Assessment and Plan:   1. Paroxysmal Afib      Admitted with what  I understand is a primarily acute hemorrhagic stroke (not hemorrhagic conversion)  AFib rates are controlled and episodes quite brief I do not think he needs AAD at this time Will need to discuss if he is an a/c candidate (If or when) with neurologist  Dr. Rayann Heman will see him later this afternoon   Risk Assessment/Risk Scores:  { For questions or updates, please contact [facility] Please consult [email or web address] for contact info under    Signed, Baldwin Jamaica, PA-C  09/28/2020 2:05 PM;t    I have seen, examined the patient, and reviewed the above assessment and plan.  Changes to above are made where necessary.  On exam, ill appearing,  Responds to voice, RRR.   Short episodes of afib are noted.  Could use diltiazem as needed. Not a candidate for [facility] therapy.  EP to follow along.  Co Sign: Thompson Grayer, MD

## 2020-09-28 NOTE — Progress Notes (Addendum)
NAME:  KAMARI BUCH, MRN:  [id number], DOB:  Jul 27, 1955, LOS: 3 ADMISSION DATE:  09/25/2020, CONSULTATION DATE:  09/25/20 REFERRING MD:  Wilford Corner,  CHIEF COMPLAINT:  Headache, r side weakness, slurred speech  History of Present Illness:  Mr. Range is a 65 yo man with hx of afib not on anticoagulation admitted with right-sided weakness, hemorrhagic stroke. Emergently intubated in the ED for airway protection  Pertinent  Medical History  Aflutter/afib (not on ac)  S/p Ablation 03/2020, hx of Cardioversion 2015 Hx retinal detachment Covid infection in July 2021 treated with monoclonal antibody Hx CV  Meds: vitamin C, Vit D, vit E, probiotic, Diltiazem 120 daily, epinephrine prn for anaphylaxis  Significant Hospital Events: Including procedures, antibiotic start and stop dates in addition to other pertinent events   . 3/28- Admit, intubated.  Not a candidate for TPA due to intracranial hemorrhage.  Started 3% saline . 3/29- EVD placement for developing hydrocephalus. CTA of the head was unremarkable . 3/30- Extubated.  Interim History / Subjective:  Waxing and waning mental status overnight. Extubated yesterday. 3% saline turned off last night. Restarted home dilt 2/2 transient a.fib.   Objective   Blood pressure 122/67, pulse (!) 51, temperature 99.2 F (37.3 C), temperature source Oral, resp. rate 15, height 6\' 2"  (1.88 m), weight 100 kg, SpO2 94 %.    Vent Mode: PSV;CPAP FiO2 (%):  [28 %-40 %] 28 % Pressure Support:  [8 cmH20] 8 cmH20   Intake/Output Summary (Last 24 hours) at 09/28/2020 0957 Last data filed at 09/28/2020 0800 Gross per 24 hour  Intake 2194.14 ml  Output 1412 ml  Net 782.14 ml   Filed Weights   09/25/20 0334 09/25/20 0524 09/27/20 0429  Weight: 107.7 kg 101 kg 100 kg    Examination: Gen:      No acute distress, drowsy. HEENT:  EOMI, sclera anicteric, EVD in place. Bloody secretions visualized in oral cavity. Neck:     No masses; no thyromegaly Lungs:     Clear to auscultation bilaterally; normal respiratory effort CV:         Regular rate and rhythm; no murmurs Abd:      + bowel sounds; soft, non-tender; no palpable masses, no distension Ext:    No edema; adequate peripheral perfusion Skin:      Warm and dry; no rash Neuro:   A&Ox1- to self only. Moves arms and sticks out tongue to command.  Labs/imaging that I havepersonally reviewed  (right click and "Reselect all SmartList Selections" daily)  Sodium 151, mag 2.1, phos 3.3, tracheal aspirate No new imaging  Resolved Hospital Problem list     Assessment & Plan:  Acute hemorrhagic stroke - CT brain and MRI reviewed with intraparenchymal hemorrhage with local mass-effect on brainstem developing hydrocephalus s/p EVD - Off 3% saline, will restart if Na<150 - Off cleveprix - Management per neurology  Acute respiratory failure due to stroke - Extubated on 3/30 - Continue to monitor mental status  Fever - Last true fever was 3/30 AM, continue to monitor - Off antibiotics - Follow tracheal aspirate: Gram stain reveals moderate gram positive cocci in pairs, Cx pending. - Plan to obtain CXR tomorrow  Atrial fibrillation, not on anticoagulation as an outpatient - Continue home diltiazem 30mg  q6h  Best Practice (right click and "Reselect all SmartList Selections" daily)   Diet:  Tube Feed  If oral type of diet:  Pain/Anxiety/Delirium protocol (if indicated): Yes (RASS goal -1) VAP protocol (if indicated): Not indicated  DVT prophylaxis: SCD GI prophylaxis: PPI Glucose control:  SSI Yes Central venous access:  N/A Arterial line:  N/A Foley:  N/A Restraints ACTION; IS/IS NOT: is not needed Mobility:  bed rest  PT consulted: Yes Studies pending: None Culture data pending: Yes, tracheal aspirate Last reviewed culture data:today Antibiotics:None  Daily labs: requested Code Status:  full code Prognosis: Serious Last date of multidisciplinary goals of care discussion []  Icu  pulm disposition: Unchanged.   Critical care time:    The patient is critically ill with multiple organ system failure and requires high complexity decision making for assessment and support, frequent evaluation and titration of therapies, advanced monitoring, review of radiographic studies and interpretation of complex data.   , MS4 [facility]  Attending note: I have seen and examined the patient. History, labs and imaging reviewed.  65 Y/O with a fib, not on anticoagulation admitted for ICH, developed hydrocephalus requiring EVD placement Off 3% saline as Na is 155 Extubated yesterday. More somnolent today AM  Blood pressure 122/67, pulse (!) 51, temperature 99.2 F (37.3 C), temperature source Oral, resp. rate 15, height 6\' 2"  (1.88 m), weight 100 kg, SpO2 94 %. Gen:      No acute distress HEENT:  EOMI, sclera anicteric, EVD in place Neck:     No masses; no thyromegaly Lungs:    Clear to auscultation bilaterally; normal respiratory effort CV:         Regular rate and rhythm; no murmurs Abd:      + bowel sounds; soft, non-tender; no palpable masses, no distension Ext:    No edema; adequate peripheral perfusion Skin:      Warm and dry; no rash Neuro: Sedated, no response to stimulus  Labs/Imaging personally reviewed, significant for Na 151, WBC 6.7  Assessment/plan: Intracranial hemorrhage, hemorrhagic stroke EVD Off 3% saline Neuro monitoring  Acute respiratory failure secondary to stroke Extubated yesterday.  Monitor respiratory status Tach asp resulted noted with GPC, GPR . Observe off antibiotics as he is afebrile with normal WBC count  Atrial fibrillation Continue Cardizem, no anticoagulation  The patient is critically ill with multiple organ systems failure and requires high complexity decision making for assessment and support, frequent evaluation and titration of therapies, application of advanced monitoring technologies and extensive  interpretation of multiple databases.  Critical care time - 35 mins. This represents my time independent of the NPs time taking care of the pt.  [facility] MD [facility] 09/28/2020, 9:17 AM

## 2020-09-28 NOTE — Progress Notes (Signed)
Inpatient Rehab Admissions Coordinator Note:   Per therapy recommendations, pt was screened for CIR candidacy by Estill Dooms, PT, DPT.  At this time we are recommending a CIR consult and I will place an order per our protocol.  Please contact me with questions.   Estill Dooms, PT, DPT [phone] 09/28/20 3:57 PM

## 2020-09-28 NOTE — Evaluation (Signed)
Physical Therapy Evaluation Patient Details Name: Walter [name] MRN: [id number] DOB: May 13, 1956 Today's Date: 09/28/2020   History of Present Illness  65 yo male presenting 3/28 with R-sided weakness and slurred speech. Imaging revealed acute hemorrhage centered within the left dorsal midbrain extending into the dorsal pons and inferomedial thalamus with intraventricular extension. EVD placement for developing hydrocephalus on 3/29. Extubated 3/30. PMH Afib, COVID infection (July 2021), and retinal detachment.  Clinical Impression  Pt in bed upon arrival of PT, agreeable to evaluation at this time. 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 The pt now presents with limitations in functional mobility, strength, power, stability, and endurance due to above dx, and will continue to benefit from skilled PT to address these deficits. The pt was able to complete initial bed mobility and sit-stand transfers with modA of 2. The pt requires modA in static stance to provide assist to RLE through blocking of the knee, stability at the trunk, and to advance RLE. The pt was able to take a few small steps, but relies on sequential cues for each step, wt shift, and for positioning of each LE. The pt is highly motivated and engaged in session, but presents with significant deficits in strength, stability, coordination, and endurance which will benefit from continued skilled PT to progress functional OOB transfers and initiation of gait. Recommend CIR level therapies to facilitate safe return to independence with mobility.      Follow Up Recommendations CIR    Equipment Recommendations   (defer to post acute)    Recommendations for Other Services Rehab consult     Precautions / Restrictions Precautions Precautions: Fall Precaution Comments: EVD Restrictions Weight Bearing Restrictions: No      Mobility  Bed Mobility Overal bed mobility: Needs  Assistance Bed Mobility: Supine to Sit;Sit to Supine     Supine to sit: Mod assist;+2 for physical assistance;+2 for safety/equipment Sit to supine: Max assist;+2 for physical assistance;+2 for safety/equipment   General bed mobility comments: modA to pull to sitting EOB, pt able to initiate movement with BLE, but modA at trunk to pull from [facility]. min/modA initially to maintain seated stability. maxA to return to supine with assist at trunk and BLE, line management    Transfers Overall transfer level: Needs assistance Equipment used: 2 person hand held assist Transfers: Sit to/from Stand Sit to Stand: Mod assist;+2 physical assistance;+2 safety/equipment         General transfer comment: modA through HHA with blocking of bilateral knees (more buckling RLE than LLE). cues for posture and upright position, but pt with good power in BLE to rise to standing.  Ambulation/Gait Ambulation/Gait assistance: Max assist;+2 physical assistance;+2 safety/equipment Gait Distance (Feet): 3 Feet Assistive device: 2 person hand held assist Gait Pattern/deviations: Step-to pattern;Decreased stride length;Decreased step length - right;Decreased stance time - right;Decreased weight shift to right;Leaning posteriorly Gait velocity: decreased Gait velocity interpretation: <1.31 ft/sec, indicative of household ambulator General Gait Details: BUE support through HHA of 2, modA to static stance, maxA for blocking of bilateral knees, assist to advance RLE, simple, direct cues for all movements in sequence   Modified Rankin (Stroke Patients Only) Modified Rankin (Stroke Patients Only) Pre-Morbid Rankin Score: No symptoms Modified Rankin: Severe disability     Balance Overall balance assessment: Needs assistance Sitting-balance support: No upper extremity supported;Feet supported Sitting balance-Leahy Scale: Poor Sitting balance - Comments: pt able to maintain with minA-minG initially. Increased sway and  wobbles in  sitting but pt able to self-correct with cues and return to midline. Postural control: Posterior lean Standing balance support: Bilateral upper extremity supported Standing balance-Leahy Scale: Poor Standing balance comment: reliant on BUE support to maintain static stance and dynamic balance                             Pertinent Vitals/Pain Pain Assessment: No/denies pain    Home Living Family/patient expects to be discharged to:: Private residence Living Arrangements: Spouse/significant other                    Prior Function Level of Independence: Independent         Comments: working in [employer]ural [employer]   Dominant Hand: Right    Extremity/Trunk Assessment   Upper Extremity Assessment Upper Extremity Assessment: Defer to OT evaluation    Lower Extremity Assessment Lower Extremity Assessment: RLE deficits/detail RLE Deficits / Details: grossly 4-/5, pt able to complete SLR, LAQ, DF against mod resistance. denies difference in sensation RLE Coordination: decreased fine motor;decreased gross motor    Cervical / Trunk Assessment Cervical / Trunk Assessment: Normal  Communication   Communication:  (limited by lethargy)  Cognition Arousal/Alertness: Lethargic Behavior During Therapy: Flat affect Overall Cognitive Status: Difficult to assess                                 General Comments: Pt able to answer simple questions regarding career and visitors, able to follow simple commands with good consistency at this time. Pt requires repeated cues to maintain eyes open through session, but remained participatory even with moments of eyes closed      General Comments General comments (skin integrity, edema, etc.): VSS on RA initially, desat to 86% (questionable plenth) so pt placed back on 2L O2        Assessment/Plan    PT Assessment Patient needs continued PT services  PT Problem List Decreased  strength;Decreased activity tolerance;Decreased balance;Decreased coordination;Decreased mobility;Decreased safety awareness       PT Treatment Interventions DME instruction;Gait training;Stair training;Functional mobility training;Therapeutic activities;Therapeutic exercise;Balance training;Neuromuscular re-education;Patient/family education    PT Goals (Current goals can be found in the Care Plan section)  Acute Rehab PT Goals Patient Stated Goal: get out of bed PT Goal Formulation: With patient Time For Goal Achievement: 10/12/20 Potential to Achieve Goals: Good    Frequency Min 4X/week        Co-evaluation PT/OT/SLP Co-Evaluation/Treatment: Yes Reason for Co-Treatment: Complexity of the patient's impairments (multi-system involvement);For patient/therapist safety;To address functional/ADL transfers PT goals addressed during session: Mobility/safety with mobility;Balance;Strengthening/ROM         AM-PAC PT "6 Clicks" Mobility  Outcome Measure Help needed turning from your back to your side while in a flat bed without using bedrails?: A Lot Help needed moving from lying on your back to sitting on the side of a flat bed without using bedrails?: A Lot Help needed moving to and from a bed to a chair (including a wheelchair)?: Total Help needed standing up from a chair using your arms (e.g., wheelchair or bedside chair)?: A Lot Help needed to walk in hospital room?: Total Help needed climbing 3-5 steps with a railing? : Total 6 Click Score: 9    End of Session Equipment Utilized During Treatment: Gait belt Activity Tolerance: Patient tolerated treatment well;Patient limited by  fatigue Patient left: in bed;with call bell/phone within reach;with bed alarm set;with SCD's reapplied Nurse Communication: Mobility status PT Visit Diagnosis: Hemiplegia and hemiparesis;Other abnormalities of gait and mobility (R26.89) Hemiplegia - Right/Left: Right Hemiplegia - dominant/non-dominant:  Dominant Hemiplegia - caused by: Nontraumatic intracerebral hemorrhage    Time: 1221-1247 PT Time Calculation (min) (ACUTE ONLY): 26 min   Charges:   PT Evaluation $PT Eval Moderate Complexity: 1 Mod          Walter [name], PT, DPT   Acute Rehabilitation Department Pager #: [phone]  Gaetana Michaelis 09/28/2020, 1:31 PM

## 2020-09-28 NOTE — Progress Notes (Signed)
Nutrition Follow-up  DOCUMENTATION CODES:   Not applicable  INTERVENTION:    Tube feeding via Cortrak tube: Vital 1.5 at 65 ml/h (1560 ml per day) Prosource TF 45 ml BID  Provides 2420 kcal, 127 gm protein, 1185 ml free water daily   NUTRITION DIAGNOSIS:   Inadequate oral intake related to inability to eat as evidenced by NPO status.Ongoing.   GOAL:   Patient will meet greater than or equal to 90% of their needs Met with TF.   MONITOR:   TF tolerance  REASON FOR ASSESSMENT:   Consult,Ventilator Enteral/tube feeding initiation and management  ASSESSMENT:   Pt with PMH of afib not on anticoagulation s/p ablation 9/21, s/p cardioversion 2015, retinal detachment s/p surgery, COVID s/p monoclonal antibody treatment 7/21, and CV admitted with right-sided weakness and ICH.     Per CT pt with intraparenchymal hemorrhage with local mass-effect on brainstem developing hydrocephalus.  Pt extubated this 3/30. 3% and cleviprex now off. Pt remains very lethargic and did not wake during visit. Pt not appropriate of swallow eval at this time and TF continue via cortrak tube.  3/29 EVD placement  3/30 extubated  Medications reviewed and include: SSI, protonix, senokot-s  Labs reviewed: Na 152 - hypertonic saline stopped  CBG's: 144-158  EVD: 227 ml    Diet Order:   Diet Order            Diet NPO time specified  Diet effective now                 EDUCATION NEEDS:   No education needs have been identified at this time  Skin:  Skin Assessment: Reviewed RN Assessment  Last BM:  3/27  Height:   Ht Readings from Last 1 Encounters:  09/25/20 '6\' 2"'  (1.88 m)    Weight:   Wt Readings from Last 1 Encounters:  09/27/20 100 kg    Ideal Body Weight:  86.3 kg  BMI:  Body mass index is 28.31 kg/m.  Estimated Nutritional Needs:   Kcal:  2400-2600  Protein:  115-130 grams  Fluid:  >2 L/day  Lockie Pares., RD, LDN, CNSC See AMiON for contact information

## 2020-09-28 NOTE — Progress Notes (Addendum)
STROKE TEAM PROGRESS NOTE   INTERVAL HISTORY Yesterday afternoon he was extubated. He had transient Afib and was restarted on his home Diltiazem.  His wife and daughter are at the bedside. He is able to hold a conversation and joke today though his voice is weak. Able to follow all commands. Discussed that while the EVD is in place he will have to stay in the ICU but that it is draining well and appears to have been very helpful. Discussed with family that given his A fib he will be at risk for clots but given his hemorrhagic stroke must be cautious about blood thinning medications. Discussed will start Aspirin now but will need to monitor and consider further medications. They had no other questions at this time.  Vitals:   09/28/20 0400 09/28/20 0500 09/28/20 0600 09/28/20 0700  BP: 120/70 127/62 126/65 (!) 146/69  Pulse: (!) 58 (!) 56 (!) 53 60  Resp: 17 15 16 15   Temp: 99.9 F (37.7 C)     TempSrc: Axillary     SpO2: 94% 93% 93% 93%  Weight:      Height:       CBC:  Recent Labs  Lab 09/25/20 0309 09/25/20 0328 09/25/20 0501  WBC 6.7  --   --   NEUTROABS 2.2  --   --   HGB 15.3 15.6 15.0  HCT 45.7 46.0 44.0  MCV 93.5  --   --   PLT 214  --   --    Basic Metabolic Panel:  Recent Labs  Lab 09/25/20 0309 09/25/20 0328 09/25/20 0501 09/25/20 0615 09/27/20 1602 09/27/20 2245 09/28/20 0512  NA 137 140 139   < > 155* 150* 151*  K 3.6 3.6 4.2  --   --   --   --   CL 104 102  --   --   --   --   --   CO2 25  --   --   --   --   --   --   GLUCOSE 169* 170*  --   --   --   --   --   BUN 15 18  --   --   --   --   --   CREATININE 1.00 0.90  --   --   --   --   --   CALCIUM 8.8*  --   --   --   --   --   --   MG  --   --   --    < > 2.1  --  2.1  PHOS  --   --   --    < > 2.5  --  3.3   < > = values in this interval not displayed.   Lipid Panel:  Recent Labs  Lab 09/25/20 1659  CHOL 155  TRIG 117  HDL 44  CHOLHDL 3.5  VLDL 23  LDLCALC 88   HgbA1c:  Recent Labs   Lab 09/25/20 0309  HGBA1C 5.7*   Urine Drug Screen:  Recent Labs  Lab 09/25/20 0304  LABOPIA NONE DETECTED  COCAINSCRNUR NONE DETECTED  LABBENZ NONE DETECTED  AMPHETMU NONE DETECTED  THCU NONE DETECTED  LABBARB NONE DETECTED    Alcohol Level  Recent Labs  Lab 09/25/20 0309  ETH <10    IMAGING past 24 hours No results found.  PHYSICAL EXAM Blood pressure (!) 146/69, pulse 60, temperature 99.9 F (37.7 C), temperature source  Axillary, resp. rate 15, height 6\' 2"  (1.88 m), weight 100 kg, SpO2 93 %.  General: drowsy but easily arousable, elderly caucasian male, no apparent distress  Lungs: Symmetrical Chest rise, no labored breathing  Cardio: Regular Rate and Rhythm  Abdomen: Soft, non-tender  Neuro: drowsy but easily arousable, Speech slowed but appears to not have aphasia.  Able to follow all commands without difficulty. Cranial Nerves: II:  Visual fields grossly normal, pupils equal, round, reactive to light and accommodation III,IV, VI: ptosis not present, extra-ocular motions intact bilaterally V,VII: smile symmetric, facial light touch sensation normal bilaterally VIII: hearing normal bilaterally IX,X: uvula rises symmetrically XI: bilateral shoulder shrug XII: midline tongue extension without atrophy or fasciculations  Motor: Right : Upper extremity   2/5 with drift left:     Upper extremity   4/5  Lower extremity   4/5     Lower extremity   4/5 Tone and bulk:normal tone throughout; no atrophy noted    ASSESSMENT/PLAN Walter [name] is a 65 y.o. male with history of atrial flutter newly diagnosed with atrial fibrillation in February not on anticoagulation,follows with Dr. March in cardiology,presenting with Right-sided weakness, slurred speech, headache, vomiting.Last known well when he went to bed at 10 PM on 09/24/2020 and woke up at around 2 AM this morning and complained to his wife that he was having some discomfort in his head or ear. She was  unable to understand him well and thought his speech was slurred. EMS was called.He was very drowsy and sleepy upon their assessment and was weaker on the right side in comparison to the left. His vitals were not too abnormal-systolic blood pressures were in the 140- 150 range on scene.   He was extubated yesterday. Home Diltiazem has been restarted due to transient aFib on monitoring. Will start Aspirin today, however, addition of further antiplatelets must be carefully considered given his hemorrhagic stroke. He is able to talk but his voice was soft possibly due to irritation from intubation. Current pop off for his EVD is 10, it is draining well, will await further neurosurgery recommendations. Will continue to monitor.   Left Dorsalmidbrain intraparenchymal Hemorrhageextending into dorsal pons and inferior thalamus with mild mass-effect on third ventricle and hydrocephalussource unknown-suspect occult cavernoma  Code Stroke CT head-Acute intraparenchymal hemorrhage in the dorsal left pons with subarachnoid extension into the cerebral aqueduct and fourth ventricle.  CTA head & neck-No vascular malformation, anomaly or aneurysm. No emergent large vessel occlusion or high-grade stenosis of the intracranial arteries. Unchanged size of dorsal left pontine hemorrhage with intraventricular extension.  MRI& MR Venogram-Acute hemorrhage centered within the left dorsal midbrain extending into the dorsal pons and inferomedial thalamus with intraventricular extension. No abnormal enhancement to suggest underlying lesion. Compression of the cerebral aqueduct with possible mild obstructive hydrocephalus. No evidence of dural sinus thrombosis. Attenuation of the left basal vein in the quadrigeminal cistern without definite occlusion.  CT Head W/O Contrast 3/29 0250 -Unchanged left brainstem hemorrhage extending into the fourth ventricle. Worsening hydrocephalus.  Diagnostic Cerebral Angiogram -  No evidence of an aneurysm, AVM, dural AV fistula or other vascular abnormality to explain patient's known intraparenchymal hemorrhage.  2D Echo- EF:55-60%. No wall motion abnormalities.  LDL- 88  HgbA1c5.7  VTE prophylaxis -SCD's       Diet   Diet NPO time specified         No antithromboticprior to admission, now on Aspirin 81 mg. Will need to monitor for further treatment due to  his A Fib but having had a hemorrhagic stroke.  Therapy recommendations:Pending  Disposition:Pending  Hypertension  Home meds:Diltiazem  Stable  Cleviprex has been stopped  Currently on Diltiazem 30 mg q6  Currently on Labetalol 20 mg q2 PRN Systolic > 160  Currently on Hydralazine 20 mg q6 PRN Systolic > 160  BP goal Systolic > 160  Long-term BP goal normotensive  Hyperlipidemia  Home meds:none  LDL88, goal < 70  Addstatin tomorrow after hemorrhage is stable  High intensity statinAtorvastatin  Continue statin at discharge  Cerebral Edema  Currently off 3% NaCl at60 cc/hr if Na drops below 150 will restart at 60 cc/hr  Na goal is 150-155  0558Na - 151  EVD placed, pop off pressure is currently 10  Drowsiness  Continue start Amantadine 100 mg BID    Other Stroke Risk Factors  Advanced Age >/= 67   Hospital day # 3  Arna Snipe MD Resident I have personally obtained history,examined this patient, reviewed notes, independently viewed imaging studies, participated in medical decision making and plan of care.ROS completed by me personally and pertinent positives fully documented  I have made any additions or clarifications directly to the above note. Agree with note above. . Patient continues to show neurological improvement in as tolerated extubation yesterday.  Is still drowsy but can be aroused and follows commands well and has mild right-sided weakness.  Recommend continue close neurological monitoring and strict blood pressure  control with systolic goal below 160.  Mobilize out of bed.  Therapy consults.  Continue ventriculostomy drainage and hopefully can challenge him with higher pop-off over the next few days if neurosurgery agrees.  Long discussion the patient, wife and daughter at the bedside and answered questions.  Will get cardiology consult for his A. fib a flutter management.  Discussed with Dr. Johney Frame This patient is critically ill and at significant risk of neurological worsening, death and care requires constant monitoring of vital signs, hemodynamics,respiratory and cardiac monitoring, extensive review of multiple databases, frequent neurological assessment, discussion with family, other specialists and medical decision making of high complexity.I have made any additions or clarifications directly to the above note.This critical care time does not reflect procedure time, or teaching time or supervisory time of PA/NP/Med Resident etc but could involve care discussion time.  I spent 30 minutes of neurocritical care time  in the care of  this patient.      Delia Heady, MD Medical Director [facility] Pager: [phone] 09/28/2020 3:44 PM   To contact Stroke Continuity provider, please refer to [email or web address]. After hours, contact General Neurology

## 2020-09-29 ENCOUNTER — Inpatient Hospital Stay (HOSPITAL_COMMUNITY): Payer: Medicare Other

## 2020-09-29 DIAGNOSIS — I613 Nontraumatic intracerebral hemorrhage in brain stem: Secondary | ICD-10-CM | POA: Diagnosis not present

## 2020-09-29 DIAGNOSIS — G9340 Encephalopathy, unspecified: Secondary | ICD-10-CM

## 2020-09-29 DIAGNOSIS — I629 Nontraumatic intracranial hemorrhage, unspecified: Secondary | ICD-10-CM

## 2020-09-29 DIAGNOSIS — D62 Acute posthemorrhagic anemia: Secondary | ICD-10-CM

## 2020-09-29 DIAGNOSIS — I4891 Unspecified atrial fibrillation: Secondary | ICD-10-CM

## 2020-09-29 DIAGNOSIS — D72829 Elevated white blood cell count, unspecified: Secondary | ICD-10-CM

## 2020-09-29 DIAGNOSIS — R7303 Prediabetes: Secondary | ICD-10-CM | POA: Diagnosis not present

## 2020-09-29 DIAGNOSIS — E870 Hyperosmolality and hypernatremia: Secondary | ICD-10-CM

## 2020-09-29 DIAGNOSIS — J9602 Acute respiratory failure with hypercapnia: Secondary | ICD-10-CM | POA: Diagnosis not present

## 2020-09-29 LAB — SODIUM
Sodium: 148 mmol/L — ABNORMAL HIGH (ref 135–145)
Sodium: 149 mmol/L — ABNORMAL HIGH (ref 135–145)
Sodium: 147 mmol/L — ABNORMAL HIGH (ref 135–145)
Sodium: 148 mmol/L — ABNORMAL HIGH (ref 135–145)

## 2020-09-29 LAB — CULTURE, RESPIRATORY W GRAM STAIN

## 2020-09-29 LAB — GLUCOSE, CAPILLARY
Glucose-Capillary: 146 mg/dL — ABNORMAL HIGH (ref 70–99)
Glucose-Capillary: 138 mg/dL — ABNORMAL HIGH (ref 70–99)
Glucose-Capillary: 122 mg/dL — ABNORMAL HIGH (ref 70–99)
Glucose-Capillary: 145 mg/dL — ABNORMAL HIGH (ref 70–99)
Glucose-Capillary: 159 mg/dL — ABNORMAL HIGH (ref 70–99)

## 2020-09-29 MED ORDER — ALTEPLASE 1 MG/ML SYRINGE FOR VASCULAR PROCEDURE
4.0000 mg | Freq: Once | INTRAMUSCULAR | Status: AC
  Administered 2020-09-29: 4 mg
  Filled 2020-09-29: qty 4

## 2020-09-29 MED ORDER — SODIUM CHLORIDE 0.9 % IV SOLN
2.0000 g | INTRAVENOUS | Status: DC
  Administered 2020-09-29 – 2020-10-01 (×3): 2 g via INTRAVENOUS
  Filled 2020-09-29 (×2): qty 2
  Filled 2020-09-29: qty 20
  Filled 2020-09-29: qty 2

## 2020-09-29 MED ORDER — SODIUM CHLORIDE 3 % IV SOLN
INTRAVENOUS | Status: DC
  Filled 2020-09-29 (×5): qty 500

## 2020-09-29 MED ORDER — BETHANECHOL CHLORIDE 10 MG PO TABS
10.0000 mg | ORAL_TABLET | Freq: Three times a day (TID) | ORAL | Status: DC
  Administered 2020-09-29 – 2020-10-17 (×57): 10 mg
  Filled 2020-09-29 (×57): qty 1

## 2020-09-29 MED ORDER — LIDOCAINE HCL URETHRAL/MUCOSAL 2 % EX GEL
1.0000 "application " | Freq: Once | CUTANEOUS | Status: AC
  Administered 2020-09-29: 1 via URETHRAL
  Filled 2020-09-29 (×2): qty 11

## 2020-09-29 NOTE — Progress Notes (Signed)
EVD unclamped after 1 hr per Dr. Jake Samples

## 2020-09-29 NOTE — Progress Notes (Signed)
Spoke with MD Merrily Pew about urology consult due to continued clots/bleeding from penis. Will continue with replacing coude catheter, per MD Chand and monitor for need of urology consult if clots continue or difficulty inserting coude occurs.

## 2020-09-29 NOTE — Progress Notes (Signed)
CT shows significant clot in R lateral ventricle around catheter  Hydro stable  I placed 1mg  (1cc) alteplase in EVD   Will leave clamped for 1 hour and re eval for drainage   , DO Neurosurgeon

## 2020-09-29 NOTE — Consult Note (Signed)
Physical Medicine and Rehabilitation Consult Reason for Consult: Right side weakness and slurred speech Referring Physician: Dr. Pearlean Brownie   HPI: Walter [name] is a 65 y.o. right-handed male with history of atrial fibrillation with ablation not on anticoagulation followed by Dr. Johney Frame.  History taken from chart review due to distress and mentation.  Patient lives with spouse.  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  He presented on 09/17/2020 with right hemiparesis, headaches, and dysarthria as well as vomiting.  Cranial CT/MRI scan showed acute intraparenchymal hemorrhage in dorsal left pons with subarachnoid extension into the cerebral aqueduct and fourth ventricle.  CT angiogram head and neck no vascular malformation, anomaly or aneurysm.  Echocardiogram with ejection fraction 55-60%, grade 1 diastolic dysfunction no regional wall motion abnormalities.  Admission chemistries unremarkable except glucose 169, alcohol negative, urine drug screen negative.  Patient did require emergent intubation for airway protection.  Hospital course complicated by hydrocephalus with diagnostic cerebral angiogram completed showing no evidence of aneurysm.  EVD was placed by neurosurgery.  Maintained on 3% hypertonic saline.  Patient was extubated 09/28/2020. Follow-up cardiology services for atrial fibrillation maintained on Cardizem.  Patient was cleared to begin Lovenox for DVT prophylaxis 09/28/2020.  Repeat head CT on 09/29/2020: Personally reviewed, showing new intraventricular hemorrhage.  Per report, and third and lateral ventricles.  Currently NPO with alternative means of nutritional support.  Due to patient decreased functional ability right side hemiparesis and cognitive deficits recommendations of physical medicine rehab consult.  Review of Systems  Unable to perform ROS: Acuity of condition   Past Medical History:  Diagnosis Date  . Atrial flutter (HCC)   . Dizzy 09/25/13  . Near  syncope 09/25/13   Past Surgical History:  Procedure Laterality Date  . A-FLUTTER ABLATION N/A 03/09/2020   Procedure: A-FLUTTER ABLATION;  Surgeon: Hillis Range, MD;  Location: [facility] INVASIVE CV LAB;  Service: Cardiovascular;  Laterality: N/A;  . CARDIOVERSION N/A 09/27/2013   Procedure: CARDIOVERSION;  Surgeon: Lewayne Bunting, MD;  Location: [facility] ENDOSCOPY;  Service: Cardiovascular;  Laterality: N/A;  . IR ANGIO EXTERNAL CAROTID SEL EXT CAROTID UNI R MOD SED  09/26/2020  . IR ANGIO INTRA EXTRACRAN SEL COM CAROTID INNOMINATE BILAT MOD SED  09/26/2020  . IR ANGIO VERTEBRAL SEL VERTEBRAL UNI R MOD SED  09/26/2020  . IR US GUIDE VASC ACCESS RIGHT  09/26/2020  . RETINAL DETACHMENT SURGERY    . right knee arthroscopy    . TEE WITHOUT CARDIOVERSION N/A 09/27/2013   Procedure: TRANSESOPHAGEAL ECHOCARDIOGRAM (TEE);  Surgeon: Lewayne Bunting, MD;  Location: [facility] ENDOSCOPY;  Service: Cardiovascular;  Laterality: N/A;  . WRIST SURGERY     Family History  Problem Relation Age of Onset  . Heart disease Mother   . Heart disease Father   . Healthy Sister   . Healthy Brother    Social History:  reports that he has never smoked. He has never used smokeless tobacco. He reports current alcohol use. He reports that he does not use drugs. Allergies:  Allergies  Allergen Reactions  . Latex Itching    Skin turns real red   Medications Prior to Admission  Medication Sig Dispense Refill  . Bioflavonoid Products (ESTER-C) 500-550 MG TABS Take 1 tablet by mouth daily.    . cholecalciferol (VITAMIN D3) 25 MCG (1000 UNIT) tablet Take 1,000 Units by mouth daily.    [name] diltiazem (CARDIZEM CD) 120 MG 24 hr capsule Take 1 capsule (120 mg total)  by mouth daily. 90 capsule 3  . EPINEPHrine 0.3 mg/0.3 mL IJ SOAJ injection Inject 0.3 mg into the muscle once as needed for anaphylaxis.    [name] diltiazem (CARDIZEM) 30 MG tablet Take 1 tablet (30 mg total) by mouth daily as needed (1-2 tablets every 4-6 hrs for fast heart rate.).  (Patient not taking: No sig reported) 30 tablet 3    Home: Home Living Family/patient expects to be discharged to:: Private residence Living Arrangements: Spouse/significant other Additional Comments: Lethargy during questions  Functional History: Prior Function Level of Independence: Independent 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 Functional Status:  Mobility: Bed Mobility Overal bed mobility: Needs Assistance Bed Mobility: Supine to Sit,Sit to Supine Supine to sit: Mod assist,+2 for physical assistance,+2 for safety/equipment Sit to supine: Max assist,+2 for physical assistance,+2 for safety/equipment General bed mobility comments: modA to pull to sitting EOB, pt able to initiate movement with BLE, but modA at trunk to pull from [facility]. min/modA initially to maintain seated stability. maxA to return to supine with assist at trunk and BLE, line management Transfers Overall transfer level: Needs assistance Equipment used: 2 person hand held assist Transfers: Sit to/from Stand Sit to Stand: Mod assist,+2 physical assistance,+2 safety/equipment General transfer comment: modA through HHA with blocking of bilateral knees (more buckling RLE than LLE). cues for posture and upright position, but pt with good power in BLE to rise to standing. Ambulation/Gait Ambulation/Gait assistance: Max assist,+2 physical assistance,+2 safety/equipment Gait Distance (Feet): 3 Feet Assistive device: 2 person hand held assist Gait Pattern/deviations: Step-to pattern,Decreased stride length,Decreased step length - right,Decreased stance time - right,Decreased weight shift to right,Leaning posteriorly General Gait Details: BUE support through HHA of 2, modA to static stance, maxA for blocking of bilateral knees, assist to advance RLE, simple, direct cues for all movements in sequence Gait velocity: decreased Gait velocity interpretation: <1.31 ft/sec, indicative of household ambulator    ADL: ADL Overall ADL's  : Needs assistance/impaired Eating/Feeding: NPO Grooming: Wash/dry face,Minimal assistance,Sitting Grooming Details (indicate cue type and reason): Pt washing his face while sitting at EOB with use of LUE. Pt with poor sitting balance during this tasks and freuqneitn switching positions requiring Min A for postural control Upper Body Bathing: Moderate assistance,Sitting Lower Body Bathing: Moderate assistance,Bed level,Sit to/from stand Upper Body Dressing : Moderate assistance,Sitting Lower Body Dressing: Maximal assistance,Sit to/from stand Toilet Transfer: Moderate assistance,+2 for physical assistance Toilet Transfer Details (indicate cue type and reason): simulated at EOB. Mod A +2 for sit<>stand and R knee blocked. Functional mobility during ADLs: Moderate assistance,+2 for physical assistance,+2 for safety/equipment,Maximal assistance (sit<>stand and then side steps towards [facility]) General ADL Comments: Pt very motivated. Lethargic and keeping eye closed. Following simple, direct cues  Cognition: Cognition Overall Cognitive Status: Difficult to assess Orientation Level: Oriented to person Cognition Arousal/Alertness: Lethargic Behavior During Therapy: Flat affect Overall Cognitive Status: Difficult to assess General Comments: Pt able to answer simple questions regarding career and visitors, able to follow simple (one step direct) commands with good consistency at this time. Pt requires repeated cues to maintain eyes open through session, but remained participatory even with moments of eyes closed Difficult to assess due to: Level of arousal  Blood pressure (!) 141/61, pulse (!) 51, temperature 97.6 F (36.4 C), temperature source Oral, resp. rate 19, height 6\' 2"  (1.88 m), weight 100 kg, SpO2 93 %. Physical Exam Vitals reviewed.  Constitutional:      General: He is in acute distress.     Appearance: Normal appearance.  HENT:     Head: Normocephalic and atraumatic.     Comments: +  NG    Right Ear: External ear normal.     Left Ear: External ear normal.     Nose: Nose normal.  Eyes:     General:        Right eye: No discharge.        Left eye: No discharge.     Comments: Keeps eyes closed  Cardiovascular:     Comments: Irregularly irregular. Pulmonary:     Breath sounds: No stridor.     Comments: + [place] Increased work of breathing Abdominal:     General: There is distension.     Palpations: Abdomen is soft.     Comments: Hypoactive bowel sounds  Musculoskeletal:     Cervical back: Normal range of motion and neck supple.     Comments: No edema or tenderness in extremities  Skin:    General: Skin is warm and dry.  Neurological:     Mental Status: He is alert.     Comments: Lethargic Motor: Limited due to participation, appears to be 4/5 throughout Dysarthria noted Provides his name and place.   Follows simple commands.  Psychiatric:     Comments: Limited due to mentation and distress.     Results for orders placed or performed during the hospital encounter of 09/25/20 (from the past 24 hour(s))  Glucose, capillary     Status: Abnormal   Collection Time: 09/28/20  8:08 AM  Result Value Ref Range   Glucose-Capillary 158 (H) 70 - 99 mg/dL  CBC     Status: Abnormal   Collection Time: 09/28/20 11:06 AM  Result Value Ref Range   WBC 11.8 (H) 4.0 - 10.5 K/uL   RBC 4.03 (L) 4.22 - 5.81 MIL/uL   Hemoglobin 12.7 (L) 13.0 - 17.0 g/dL   HCT 13.240.1 44.039.0 - 10.252.0 %   MCV 99.5 80.0 - 100.0 fL   MCH 31.5 26.0 - 34.0 pg   MCHC 31.7 30.0 - 36.0 g/dL   RDW 72.514.0 36.611.5 - 44.015.5 %   Platelets PLATELET CLUMPS NOTED ON SMEAR, UNABLE TO ESTIMATE 150 - 400 K/uL   nRBC 0.0 0.0 - 0.2 %  Basic metabolic panel     Status: Abnormal   Collection Time: 09/28/20 11:06 AM  Result Value Ref Range   Sodium 152 (H) 135 - 145 mmol/L   Potassium 3.7 3.5 - 5.1 mmol/L   Chloride 122 (H) 98 - 111 mmol/L   CO2 27 22 - 32 mmol/L   Glucose, Bld 135 (H) 70 - 99 mg/dL   BUN 11 8 - 23 mg/dL    Creatinine, Ser 3.470.75 0.61 - 1.24 mg/dL   Calcium 8.8 (L) 8.9 - 10.3 mg/dL   GFR, Estimated >42>60 >59>60 mL/min   Anion gap 3 (L) 5 - 15  Glucose, capillary     Status: Abnormal   Collection Time: 09/28/20 11:53 AM  Result Value Ref Range   Glucose-Capillary 144 (H) 70 - 99 mg/dL  Glucose, capillary     Status: Abnormal   Collection Time: 09/28/20  4:15 PM  Result Value Ref Range   Glucose-Capillary 164 (H) 70 - 99 mg/dL  Sodium     Status: Abnormal   Collection Time: 09/28/20  4:59 PM  Result Value Ref Range   Sodium 151 (H) 135 - 145 mmol/L  Glucose, capillary     Status: Abnormal   Collection Time: 09/28/20  7:39 PM  Result Value Ref Range   Glucose-Capillary 123 (H) 70 - 99 mg/dL  Sodium     Status: Abnormal   Collection Time: 09/28/20 10:51 PM  Result Value Ref Range   Sodium 151 (H) 135 - 145 mmol/L  Glucose, capillary     Status: Abnormal   Collection Time: 09/28/20 11:16 PM  Result Value Ref Range   Glucose-Capillary 133 (H) 70 - 99 mg/dL   CT HEAD WO CONTRAST  Result Date: 09/29/2020 CLINICAL DATA:  Follow-up intracranial hemorrhage EXAM: CT HEAD WITHOUT CONTRAST TECHNIQUE: Contiguous axial images were obtained from the base of the skull through the vertex without intravenous contrast. COMPARISON:  09/26/2020 FINDINGS: Brain: New right frontal external ventricular drain which traverses the body of the right lateral ventricle. Increase in intraventricular blood at the level of the third and lateral ventricles. Hemorrhage encompasses the lower catheter at the level of the parenchyma and also encompasses the intraventricular portion of the catheter. Small volume subarachnoid hemorrhage at the right vertex. The parenchymal hemorrhage in the left posterior midbrain, just left of the cerebral aqua duct, is unchanged. Unchanged ventriculomegaly. Small cortically based infarcts have become apparent along the superior left frontal convexity. Vascular: No hyperdense vessel or unexpected  calcification. Skull: Normal. Negative for fracture or focal lesion. Sinuses/Orbits: No acute finding. IMPRESSION: 1. New intraventricular hemorrhage in the third and lateral ventricles. Fourth ventricular and midbrain hemorrhage is unchanged. 2. New right frontal EVD which traverses the right lateral ventricle. Blood clot encompasses the lower catheter and ventriculomegaly is similar to prior. 3. Small cortical infarcts are newly seen along the superior left frontal convexity. Electronically Signed   By: Marnee Spring M.D.   On: 09/29/2020 04:28    Assessment/Plan: Diagnosis: Acute intraparenchymal hemorrhage in dorsal left pons with subarachnoid extension into the cerebral aqueduct and fourth ventricle Stroke: Continue secondary stroke prophylaxis and Risk Factor Modification listed below:   Blood Pressure Management:  Continue current medication with prn's with permisive HTN per primary team Prediabetes management:   ?hemiparesis: fit for orthosis to prevent contractures (resting hand splint for day, wrist cock up splint at night, PRAFO, etc) PT/OT for mobility, ADL training  Motor recovery: Fluoxetine Labs and images (see above) independently reviewed.  Records reviewed and summated above.  1. Does the need for close, 24 hr/day medical supervision in concert with the patient's rehab needs make it unreasonable for this patient to be served in a less intensive setting? Yes  2. Co-Morbidities requiring supervision/potential complications: atrial fibrillation (anticoagulation on hold due to brain hemorrhage, monitor with increased activity), prediabetes (Monitor in accordance with exercise and adjust meds as necessary), leukocytosis (repeat labs, cont to monitor for signs and symptoms of infection, further workup if indicated), ABLA (repeat labs, consider transfusion if necessary to ensure appropriate perfusion for increased activity tolerance), hyponatremia (wean 3% hypertonic saline when  appropriate) 3. Due to bladder management, bowel management, safety, skin/wound care, disease management, medication administration, pain management and patient education, does the patient require 24 hr/day rehab nursing? Yes 4. Does the patient require coordinated care of a physician, rehab nurse, therapy disciplines of PT/OT/SLP to address physical and functional deficits in the context of the above medical diagnosis(es)? Yes Addressing deficits in the following areas: balance, endurance, locomotion, strength, transferring, bowel/bladder control, bathing, dressing, feeding, grooming, toileting, cognition, speech, swallowing and psychosocial support 5. Can the patient actively participate in an intensive therapy program of at least 3 hrs of therapy per day at least 5 days per week? Potentially  6. The potential for patient to make measurable gains while on inpatient rehab is excellent 7. Anticipated functional outcomes upon discharge from inpatient rehab are min assist  with PT, min assist with OT, modified independent and supervision with SLP. 8. Estimated rehab length of stay to reach the above functional goals is: 18-22 days. 9. Anticipated discharge destination: Home 10. Overall Rehab/Functional Prognosis: good  RECOMMENDATIONS: This patient's condition is appropriate for continued rehabilitative care in the following setting: CIR medically when stable and able tolerate 3 hours of therapy per day Patient has agreed to participate in recommended program. Potentially Note that insurance prior authorization may be required for reimbursement for recommended care.  Comment: Rehab Admissions Coordinator to follow up.  I have personally performed a face to face diagnostic evaluation, including, but not limited to relevant history and physical exam findings, of this patient and developed relevant assessment and plan.  Additionally, I have reviewed and concur with the physician assistant's documentation  above.   Maryla Morrow, MD, ABPMR Mcarthur Rossetti Angiulli, PA-C 09/29/2020

## 2020-09-29 NOTE — Progress Notes (Signed)
Call made to Dr. Danielle Dess to d/t drainage chamber being close to full and still unable to drain into collection bag. He will make a call to colleague and have them change it.

## 2020-09-29 NOTE — Progress Notes (Signed)
Patient ID: Walter [name], male   DOB: [birth year], 65 y.o.   MRN: [id number] Vital signs are stable currently Events of last night and this morning noted A few hours after receiving TPA as IVC started to drain spontaneously Drainage system has been changed and continues to put out bloody CSF Clinically he arouses to voice and opens eyes will follow commands at the current time gaze remains disconjugate. Plan is to monitor this IVC as long as it continues to drain.  He may require more TPA should reclotting occur. I discussed the situation with his wife this evening by phone.

## 2020-09-29 NOTE — Progress Notes (Signed)
NAME:  SEPHIROTH MCLUCKIE, MRN:  [id number], DOB:  [birth year], LOS: 4 ADMISSION DATE:  09/25/2020, CONSULTATION DATE:  09/25/20 REFERRING MD:  Wilford Corner,  CHIEF COMPLAINT:  Headache, r side weakness, slurred speech  History of Present Illness:  Mr. Spagnolo is a 65 yo man with hx of afib not on anticoagulation admitted with right-sided weakness, hemorrhagic stroke. Emergently intubated in the ED for airway protection  Pertinent  Medical History  Aflutter/afib (not on ac)  S/p Ablation 03/2020, hx of Cardioversion 2015 Hx retinal detachment Covid infection in July 2021 treated with monoclonal antibody Hx CV  Meds: vitamin C, Vit D, vit E, probiotic, Diltiazem 120 daily, epinephrine prn for anaphylaxis  Significant Hospital Events: Including procedures, antibiotic start and stop dates in addition to other pertinent events   . 3/28- Admit, intubated.  Not a candidate for TPA due to intracranial hemorrhage.  Started 3% saline . 3/29- EVD placement for developing hydrocephalus. CTA of the head was unremarkable . 3/30- Extubated.  Interim History / Subjective:  Waxing and waning mental status Overnight EVD showed no drainage, CT head was done which shows hydrocephalus and clot in lateral and third ventricle, TPA was given to the catheter Remains on 3% saline  Objective   Blood pressure 134/63, pulse 60, temperature 99.1 F (37.3 C), temperature source Axillary, resp. rate 20, height 6\' 2"  (1.88 m), weight 100 kg, SpO2 94 %.        Intake/Output Summary (Last 24 hours) at 09/29/2020 1050 Last data filed at 09/29/2020 0900 Gross per 24 hour  Intake 1704.68 ml  Output 1342 ml  Net 362.68 ml   Filed Weights   09/25/20 0334 09/25/20 0524 09/27/20 0429  Weight: 107.7 kg 101 kg 100 kg    Examination: Gen:      Elderly Caucasian male, lying in the bed HEENT:  EOMI, sclera anicteric, EVD in place with bloody secretions.  Severely dry mucous membranes Neck:     No masses; no thyromegaly Lungs:     Clear to auscultation bilaterally; normal respiratory effort CV:         Regular rate and rhythm; no murmurs Abd:      + bowel sounds; soft, non-tender; no palpable masses, no distension Ext:    No edema; adequate peripheral perfusion Skin:      Warm and dry; no rash Neuro:   Lethargic, opens eyes with vocal stimuli, intermittently following few commands, antigravity on left upper and bilateral lower extremity, 2/3 on right upper extremity  Labs/imaging that I havepersonally reviewed  (right click and "Reselect all SmartList Selections" daily)  Sodium 148 Tracheal aspirate is growing [place]  CT head 4/1: New intraventricular hemorrhage in the third and lateral ventricles. Fourth ventricular and midbrain hemorrhage is unchanged. 2. New right frontal EVD which traverses the right lateral ventricle. Blood clot encompasses the lower catheter and ventriculomegaly is similar to prior. 3. Small cortical infarcts are newly seen along the superior left frontal convexity  Resolved Hospital Problem list     Assessment & Plan:  Acute hemorrhagic stroke with IVH Acute obstructive hydrocephalus s/p EVD CT brain and MRI reviewed with intraparenchymal hemorrhage with local mass-effect on brainstem developing hydrocephalus s/p EVD Continue 3% saline, serum sodium goal 155-165  stroke team is following  Acute respiratory failure due to stroke Extubated on 3/30 Continue to monitor mental status Continue nasal cannula oxygen with O2 sat goal 92%  Community-acquired pneumonia with [place]axella Sputum culture grew [place] X-ray chest is suggestive of right  lower lobe infiltrate A started on IV ceftriaxone to complete 7 days therapy  Paroxysmal atrial fibrillation, not on anticoagulation as an outpatient Continue home diltiazem 30mg  q6h  Best Practice (right click and "Reselect all SmartList Selections" daily)   Diet:  Tube Feed  Pain/Anxiety/Delirium protocol (if indicated): N/A VAP  protocol (if indicated): Not indicated DVT prophylaxis: SCD GI prophylaxis: PPI Glucose control:  SSI Yes Central venous access:  N/A Arterial line:  N/A Foley:  N/A Mobility:  bed rest  PT consulted: Yes Code Status:  full code Prognosis: Serious Last date of multidisciplinary goals of care discussion [Per primary team]   Total critical care time: 43 minutes  Performed by:   Critical care time was exclusive of separately billable procedures and treating other patients.   Critical care was necessary to treat or prevent imminent or life-threatening deterioration.   Critical care was time spent personally by me on the following activities: development of treatment plan with patient and/or surrogate as well as nursing, discussions with consultants, evaluation of patient's response to treatment, examination of patient, obtaining history from patient or surrogate, ordering and performing treatments and interventions, ordering and review of laboratory studies, ordering and review of radiographic studies, pulse oximetry and re-evaluation of patient's condition.   Cheri Fowler MD [facility] See Amion for pager If no response to pager, please call [phone] until 7pm After 7pm, Please call E-link [phone]

## 2020-09-29 NOTE — Progress Notes (Signed)
There was some question today whether this patient needed a left-sided ventriculostomy placed.  He has a working right sided ventriculostomy and his ICP was 9.  I looked at his CT scan and he does not have significant dilation of his lateral ventricle on the left.  There is intraventricular hemorrhage on the right and I suspect given the amount of drainage of CSF from the right-sided drain that most of that is coming from the left ventricular system.  I do not think that the left ventricle is trapped.  Therefore I do not think he would benefit from a left-sided ventriculostomy and therefore I think the risk of placing 1 far outweighs the potential benefits at this time.  I think he is more sleepy because he is now 3 days out from his hemorrhage and has intraventricular blood, and not because he has hydrocephalus.  Again, we are monitoring his ICP now through the ventriculostomy and for now it seems to be staying in the normal range.

## 2020-09-29 NOTE — Progress Notes (Signed)
SLP Cancellation Note  Patient Details Name: Walter [name] MRN: [id number] DOB: [birth year]   Cancelled treatment:        Dr. Pearlean Brownie says to hold treatment today- may need to go back to OR?   Royce Macadamia 09/29/2020, 10:07 AM  Breck Coons Lonell Face.Ed Nurse, children's [phone] Office [phone]

## 2020-09-29 NOTE — Progress Notes (Signed)
Spoke with Dr. Danielle Dess re: clot in EVD drainage chamber prohibiting drainage into collection bag. Currently 15 cc of drainage in chamber (10 cc of drainage is flush from Dr. Mattie Marlin attempt at flushing catheter overnight). Dr. Danielle Dess to speak with fellow [facility] colleague about changing out drainage component.

## 2020-09-29 NOTE — Progress Notes (Signed)
Inpatient Rehab Admissions Coordinator:   Note new IVH in the 3rd/4th ventricles plan for new EVD per neurosurg.  I will follow up with pt and family on Monday.   Estill Dooms, PT, DPT Admissions Coordinator [phone] 09/29/20  4:31 PM

## 2020-09-29 NOTE — Progress Notes (Signed)
Pt has been unable to void this shift. Pt has blood coming from penis. Order to reinsert coude catheter and retention meds started. Supplies must be ordered for coude insertion

## 2020-09-29 NOTE — Progress Notes (Signed)
   09/29/20 0100  Provider Notification  Provider Name/Title Dr. Jake Samples  Date Provider Notified 09/29/20  Time Provider Notified 0100  Notification Type Page  Notification Reason Change in status (no output from EVD)  Provider response Other (Comment) (Continue to monitor. Physician will flush drain)  Date of Provider Response 09/29/20  Time of Provider Response 0110

## 2020-09-29 NOTE — Progress Notes (Signed)
PT Cancellation Note  Patient Details Name: Walter [name] MRN: [id number] DOB: April 27, 1956   Cancelled Treatment:    Reason Eval/Treat Not Completed: Medical issues which prohibited therapy today. Pt with EVD clotting overnight and extension of IVH noted on repeat imaging. Rn asked therapies to hold for morning, and then pt began having clots/bleeding from penis this afternoon. PT will hold and re-evaluate as appropriate.   Rolm Baptise, PT, DPT   Acute Rehabilitation Department Pager #: [phone]   Gaetana Michaelis 09/29/2020, 3:02 PM

## 2020-09-29 NOTE — Progress Notes (Addendum)
STROKE TEAM PROGRESS NOTE   INTERVAL HISTORY Events of last night noted.  His EVD had clotted off.  CT scan of the head showed increasing hydrocephalus and new right-sided intraventricular and third ventricular and fourth ventricular blood.  The ventriculostomy catheter seems to be draining somewhat now after intraventricular catheter TPA.  Discussed with Dr. Danielle Dess who feels the catheter is likely to get blocked again and patient would be better served with having left-sided new ventriculostomy catheter placed.  His wife and daughter are at the bedside. He is able to hold a conversation and joke today though his voice is weak. Able to follow all commands. Discussed that while the EVD is in place he will have to stay in the ICU. EVD clotted and patient now has significant IVH. Spoke with neurosurgery regarding placement of new left sided EVD. Notified wife and daughter at bedside.   Vitals:   09/29/20 1000 09/29/20 1035 09/29/20 1100 09/29/20 1200  BP:  (!) 148/75 (!) 148/65   Pulse: 61 62 (!) 59   Resp: 20 20 16    Temp:    99.1 F (37.3 C)  TempSrc:    Axillary  SpO2: 93% 93% 92%   Weight:      Height:       CBC:  Recent Labs  Lab 09/25/20 0309 09/25/20 0328 09/25/20 0501 09/28/20 1106  WBC 6.7  --   --  11.8*  NEUTROABS 2.2  --   --   --   HGB 15.3   < > 15.0 12.7*  HCT 45.7   < > 44.0 40.1  MCV 93.5  --   --  99.5  PLT 214  --   --  PLATELET CLUMPS NOTED ON SMEAR, UNABLE TO ESTIMATE   < > = values in this interval not displayed.   Basic Metabolic Panel:  Recent Labs  Lab 09/25/20 0309 09/25/20 0328 09/25/20 0501 09/25/20 0615 09/27/20 1602 09/27/20 2245 09/28/20 0512 09/28/20 1106 09/28/20 1659 09/29/20 0530 09/29/20 1110  NA 137 140 139   < > 155*   < > 151* 152*   < > 148* 149*  K 3.6 3.6 4.2  --   --   --   --  3.7  --   --   --   CL 104 102  --   --   --   --   --  122*  --   --   --   CO2 25  --   --   --   --   --   --  27  --   --   --   GLUCOSE 169* 170*   --   --   --   --   --  135*  --   --   --   BUN 15 18  --   --   --   --   --  11  --   --   --   CREATININE 1.00 0.90  --   --   --   --   --  0.75  --   --   --   CALCIUM 8.8*  --   --   --   --   --   --  8.8*  --   --   --   MG  --   --   --    < > 2.1  --  2.1  --   --   --   --  PHOS  --   --   --    < > 2.5  --  3.3  --   --   --   --    < > = values in this interval not displayed.   Lipid Panel:  Recent Labs  Lab 09/25/20 1659  CHOL 155  TRIG 117  HDL 44  CHOLHDL 3.5  VLDL 23  LDLCALC 88   HgbA1c:  Recent Labs  Lab 09/25/20 0309  HGBA1C 5.7*   Urine Drug Screen:  Recent Labs  Lab 09/25/20 0304  LABOPIA NONE DETECTED  COCAINSCRNUR NONE DETECTED  LABBENZ NONE DETECTED  AMPHETMU NONE DETECTED  THCU NONE DETECTED  LABBARB NONE DETECTED    Alcohol Level  Recent Labs  Lab 09/25/20 0309  ETH <10    IMAGING past 24 hours CT HEAD WO CONTRAST  Result Date: 09/29/2020 CLINICAL DATA:  Follow-up intracranial hemorrhage EXAM: CT HEAD WITHOUT CONTRAST TECHNIQUE: Contiguous axial images were obtained from the base of the skull through the vertex without intravenous contrast. COMPARISON:  09/26/2020 FINDINGS: Brain: New right frontal external ventricular drain which traverses the body of the right lateral ventricle. Increase in intraventricular blood at the level of the third and lateral ventricles. Hemorrhage encompasses the lower catheter at the level of the parenchyma and also encompasses the intraventricular portion of the catheter. Small volume subarachnoid hemorrhage at the right vertex. The parenchymal hemorrhage in the left posterior midbrain, just left of the cerebral aqua duct, is unchanged. Unchanged ventriculomegaly. Small cortically based infarcts have become apparent along the superior left frontal convexity. Vascular: No hyperdense vessel or unexpected calcification. Skull: Normal. Negative for fracture or focal lesion. Sinuses/Orbits: No acute finding.  IMPRESSION: 1. New intraventricular hemorrhage in the third and lateral ventricles. Fourth ventricular and midbrain hemorrhage is unchanged. 2. New right frontal EVD which traverses the right lateral ventricle. Blood clot encompasses the lower catheter and ventriculomegaly is similar to prior. 3. Small cortical infarcts are newly seen along the superior left frontal convexity. Electronically Signed   By: Marnee Spring M.D.   On: 09/29/2020 04:28   DG CHEST PORT 1 VIEW  Result Date: 09/29/2020 CLINICAL DATA:  Acute respiratory failure EXAM: PORTABLE CHEST 1 VIEW COMPARISON:  Three days ago FINDINGS: Feeding tube which at least reaches the stomach. Lucency over the left upper quadrant is presumably stomach. Cardiomegaly. Congested appearance of central vessels. No Kerley lines, effusion, or pneumothorax. IMPRESSION: Low volume chest which accentuates cardiomegaly and vascular congestion. Electronically Signed   By: Marnee Spring M.D.   On: 09/29/2020 09:44    PHYSICAL EXAM Blood pressure (!) 148/65, pulse (!) 59, temperature 99.1 F (37.3 C), temperature source Axillary, resp. rate 16, height 6\' 2"  (1.88 m), weight 100 kg, SpO2 92 %.  General: drowsy but easily arousable, elderly caucasian male, no apparent distress. Soft spoken. Appears more drowsy than exam yesterday.  Right frontal ventriculostomy catheter  Lungs: Symmetrical Chest rise, no labored breathing  Cardio: Regular Rate and Rhythm  Abdomen: Soft, non-tender  Neurological Exam : drowsy but   arousable with sternal rub, Speech slowed but appears to not have aphasia.  Able to follow all commands without difficulty.  Cranial Nerves: II:  Visual fields grossly normal, pupils equal, round, reactive to light and accommodation III,IV, VI: ptosis not present, extra-ocular motions intact bilaterally V,VII: smile symmetric, facial light touch sensation normal bilaterally VIII: hearing normal bilaterally IX,X: uvula rises  symmetrically XI: bilateral shoulder shrug XII: midline tongue extension without atrophy  or fasciculations  Motor: Right : Upper extremity   2/5 with drift left:     Upper extremity   4/5  Lower extremity   4/5     Lower extremity   4/5 Tone and bulk:normal tone throughout; no atrophy noted    ASSESSMENT/PLAN [name] is a 66 y.o. male with history of atrial flutter newly diagnosed with atrial fibrillation in February not on anticoagulation,follows with Dr. Johney Frame in cardiology,presenting with Right-sided weakness, slurred speech, headache, vomiting.Last known well when he went to bed at 10 PM on 09/24/2020 and woke up at around 2 AM on day of admission and complained to his wife that he was having some discomfort in his head or ear. She was unable to understand him well and thought his speech was slurred. EMS was called.He was very drowsy and sleepy upon their assessment and was weaker on the right side in comparison to the left.  MRI brain showed acute hemorrhage at left midbrain extending into pons with IVH associated with obstructive hydrocephalus.   Plan for placement of left sided EVD by neurosurgery today.   Needs close neurological monitoring and strict  Blood pressure control with systolic goal below 160. Long discussion with wife and daughter at bedside and answered questions.  Cardiology following regarding management of atrial fib/flutter.       Left Dorsalmidbrain intraparenchymal Hemorrhageextending into dorsal pons and inferior thalamus with mild mass-effect on third ventricle and hydrocephalussource unknown-suspect occult cavernoma  Code Stroke CT head-Acute intraparenchymal hemorrhage in the dorsal left pons with subarachnoid extension into the cerebral aqueduct and fourth ventricle.  CTA head & neck-No vascular malformation, anomaly or aneurysm. No emergent large vessel occlusion or high-grade stenosis of the intracranial arteries. Unchanged size of  dorsal left pontine hemorrhage with intraventricular extension.  MRI& MR Venogram-Acute hemorrhage centered within the left dorsal midbrain extending into the dorsal pons and inferomedial thalamus with intraventricular extension. No abnormal enhancement to suggest underlying lesion. Compression of the cerebral aqueduct with possible mild obstructive hydrocephalus. No evidence of dural sinus thrombosis. Attenuation of the left basal vein in the quadrigeminal cistern without definite occlusion.  CT Head W/O Contrast 3/29 0250 -Unchanged left brainstem hemorrhage extending into the fourth ventricle. Worsening hydrocephalus.  Diagnostic Cerebral Angiogram - No evidence of an aneurysm, AVM, dural AV fistula or other vascular abnormality to explain patient's known intraparenchymal hemorrhage.  2D Echo- EF:55-60%. No wall motion abnormalities.  LDL- 88  HgbA1c5.7  VTE prophylaxis -SCD's       Diet   Diet NPO time specified         No antithromboticprior to admission, now on Aspirin 81 mg. Will need to monitor for further treatment due to his A Fib but having had a hemorrhagic stroke.  Therapy recommendations:Pending  Disposition:Pending  Hypertension  Home meds:Diltiazem  Stable  Cleviprex has been stopped  Currently on Diltiazem 30 mg q6  Currently on Labetalol 20 mg q2 PRN Systolic > 160  Currently on Hydralazine 20 mg q6 PRN Systolic > 160  BP goal Systolic > 160  Long-term BP goal normotensive  Hyperlipidemia  Home meds:none  LDL88, goal < 70  Addstatin tomorrow after hemorrhage is stable  High intensity statinAtorvastatin  Continue statin at discharge  Cerebral Edema  Currently on 3% NaCl at60 cc/hr /  Na goal is 150-155   na today 149.     Drowsiness  Continue start Amantadine 100 mg BID    Other Stroke Risk Factors  Advanced Age >/=  65   Hospital day # 4  I have personally obtained history,examined this  patient, reviewed notes, independently viewed imaging studies, participated in medical decision making and plan of care.ROS completed by me personally and pertinent positives fully documented  I have made any additions or clarifications directly to the above note. Agree with note above.  Patient had right intraventricular catheter blocked yesterday and repeat CT scan shows increase intraventricular hemorrhage with a lot of blood clot around catheter tip service likely going to not drain very well or get blocked soon.  I agree with Dr. Danielle Dess patient may benefit with consideration for new left sided intraventricular catheter to be placed later today.  Long discussion with the bedside with the patient's daughter and wife and answered questions.  Discussed with neurosurgery nurse practitioner Cala Bradford and with Dr. Merrily Pew and Dr. Danielle Dess. This patient is critically ill and at significant risk of neurological worsening, death and care requires constant monitoring of vital signs, hemodynamics,respiratory and cardiac monitoring, extensive review of multiple databases, frequent neurological assessment, discussion with family, other specialists and medical decision making of high complexity.I have made any additions or clarifications directly to the above note.This critical care time does not reflect procedure time, or teaching time or supervisory time of PA/NP/Med Resident etc but could involve care discussion time.  I spent 40 minutes of neurocritical care time  in the care of  this patient.      Delia Heady, MD Medical Director [facility] Pager: [phone] 09/29/2020 3:37 PM   To contact Stroke Continuity provider, please refer to [email or web address]. After hours, contact General Neurology

## 2020-09-29 NOTE — Progress Notes (Signed)
EVD eval due to no drainage  Proximal and distal clot noted, no meniscus or draining when dropped  Flushed proximal and distal, large clot flushed, still no spontaneous flow or meniscus  Getting head CT for eval for need for replacement     Monia Pouch, DO Neurosurgeon

## 2020-09-30 DIAGNOSIS — E870 Hyperosmolality and hypernatremia: Secondary | ICD-10-CM | POA: Diagnosis not present

## 2020-09-30 DIAGNOSIS — I480 Paroxysmal atrial fibrillation: Secondary | ICD-10-CM | POA: Diagnosis not present

## 2020-09-30 DIAGNOSIS — J9602 Acute respiratory failure with hypercapnia: Secondary | ICD-10-CM | POA: Diagnosis not present

## 2020-09-30 DIAGNOSIS — G9340 Encephalopathy, unspecified: Secondary | ICD-10-CM | POA: Diagnosis not present

## 2020-09-30 DIAGNOSIS — I629 Nontraumatic intracranial hemorrhage, unspecified: Secondary | ICD-10-CM | POA: Diagnosis not present

## 2020-09-30 DIAGNOSIS — I4891 Unspecified atrial fibrillation: Secondary | ICD-10-CM | POA: Diagnosis not present

## 2020-09-30 DIAGNOSIS — G911 Obstructive hydrocephalus: Secondary | ICD-10-CM

## 2020-09-30 LAB — CBC
HCT: 41.8 % (ref 39.0–52.0)
Hemoglobin: 13.6 g/dL (ref 13.0–17.0)
MCH: 31.6 pg (ref 26.0–34.0)
MCHC: 32.5 g/dL (ref 30.0–36.0)
MCV: 97.2 fL (ref 80.0–100.0)
Platelets: 145 10*3/uL — ABNORMAL LOW (ref 150–400)
RBC: 4.3 MIL/uL (ref 4.22–5.81)
RDW: 13.4 % (ref 11.5–15.5)
WBC: 9.6 10*3/uL (ref 4.0–10.5)
nRBC: 0 % (ref 0.0–0.2)

## 2020-09-30 LAB — SODIUM
Sodium: 148 mmol/L — ABNORMAL HIGH (ref 135–145)
Sodium: 148 mmol/L — ABNORMAL HIGH (ref 135–145)
Sodium: 144 mmol/L (ref 135–145)

## 2020-09-30 LAB — GLUCOSE, CAPILLARY
Glucose-Capillary: 125 mg/dL — ABNORMAL HIGH (ref 70–99)
Glucose-Capillary: 133 mg/dL — ABNORMAL HIGH (ref 70–99)
Glucose-Capillary: 139 mg/dL — ABNORMAL HIGH (ref 70–99)
Glucose-Capillary: 141 mg/dL — ABNORMAL HIGH (ref 70–99)
Glucose-Capillary: 153 mg/dL — ABNORMAL HIGH (ref 70–99)
Glucose-Capillary: 160 mg/dL — ABNORMAL HIGH (ref 70–99)

## 2020-09-30 LAB — BASIC METABOLIC PANEL
Anion gap: 9 (ref 5–15)
BUN: 16 mg/dL (ref 8–23)
CO2: 21 mmol/L — ABNORMAL LOW (ref 22–32)
Calcium: 8.8 mg/dL — ABNORMAL LOW (ref 8.9–10.3)
Chloride: 116 mmol/L — ABNORMAL HIGH (ref 98–111)
Creatinine, Ser: 0.74 mg/dL (ref 0.61–1.24)
GFR, Estimated: >60 mL/min (ref >60)
Glucose, Bld: 139 mg/dL — ABNORMAL HIGH (ref 70–99)
Potassium: 3.6 mmol/L (ref 3.5–5.1)
Sodium: 146 mmol/L — ABNORMAL HIGH (ref 135–145)

## 2020-09-30 MED ORDER — VITAL 1.5 CAL PO LIQD
1000.0000 mL | ORAL | Status: DC
  Administered 2020-09-30 – 2020-10-01 (×2): 1000 mL
  Filled 2020-09-30 (×2): qty 1000

## 2020-09-30 MED ORDER — SODIUM CHLORIDE 0.9 % IV SOLN
INTRAVENOUS | Status: DC | PRN
  Administered 2020-09-30: 250 mL via INTRAVENOUS
  Administered 2020-10-19: 500 mL via INTRAVENOUS

## 2020-09-30 MED ORDER — SODIUM CHLORIDE 3 % IV SOLN
INTRAVENOUS | Status: DC
  Filled 2020-09-30 (×2): qty 500

## 2020-09-30 MED ORDER — SODIUM CHLORIDE 23.4 % INJECTION (4 MEQ/ML) FOR IV ADMINISTRATION
120.0000 meq | Freq: Once | INTRAVENOUS | Status: AC
  Administered 2020-09-30: 120 meq via INTRAVENOUS
  Filled 2020-09-30: qty 30

## 2020-09-30 NOTE — Progress Notes (Addendum)
STROKE TEAM PROGRESS NOTE   INTERVAL HISTORY  CT scan of the head showed increasing hydrocephalus and third and fourth ventricular blood.  His right frontal EVD clotted yesterday morning requiring intraventricular catheter TpA.  Shortly afterwards the EVD spontaneously started draining and continues to drain well.    The patient opens his eyes briefly.  He is oriented to self and place.  He denies any headaches.  He opens his mouth to command.  Tongue is midline.  Dysconjugate gaze. Horizontal nystagmus.  Right and left eye unable to completely abduct. LU 4/5, RU drift 3/5, LL spontaneous (antigravity), RL flicker to noxious stimuli.  Vitals:   09/30/20 0900 09/30/20 1000 09/30/20 1100 09/30/20 1200  BP: (!) 154/82 (!) 144/79 (!) 154/85 (!) 158/74  Pulse: (!) 57 63 65 61  Resp: 20 11 15 15   Temp:      TempSrc:      SpO2: 97% 96% 96% 97%  Weight:      Height:       CBC:  Recent Labs  Lab 09/25/20 0309 09/25/20 0328 09/28/20 1106 09/30/20 0317  WBC 6.7  --  11.8* 9.6  NEUTROABS 2.2  --   --   --   HGB 15.3   < > 12.7* 13.6  HCT 45.7   < > 40.1 41.8  MCV 93.5  --  99.5 97.2  PLT 214  --  PLATELET CLUMPS NOTED ON SMEAR, UNABLE TO ESTIMATE 145*   < > = values in this interval not displayed.   Basic Metabolic Panel:  Recent Labs  Lab 09/27/20 1602 09/27/20 2245 09/28/20 0512 09/28/20 1106 09/28/20 1659 09/30/20 0317 09/30/20 1046  NA 155*   < > 151* 152*   < > 146* 148*  K  --   --   --  3.7  --  3.6  --   CL  --   --   --  122*  --  116*  --   CO2  --   --   --  27  --  21*  --   GLUCOSE  --   --   --  135*  --  139*  --   BUN  --   --   --  11  --  16  --   CREATININE  --   --   --  0.75  --  0.74  --   CALCIUM  --   --   --  8.8*  --  8.8*  --   MG 2.1  --  2.1  --   --   --   --   PHOS 2.5  --  3.3  --   --   --   --    < > = values in this interval not displayed.   Lipid Panel:  Recent Labs  Lab 09/25/20 1659  CHOL 155  TRIG 117  HDL 44  CHOLHDL 3.5   VLDL 23  LDLCALC 88   HgbA1c:  Recent Labs  Lab 09/25/20 0309  HGBA1C 5.7*   Urine Drug Screen:  Recent Labs  Lab 09/25/20 0304  LABOPIA NONE DETECTED  COCAINSCRNUR NONE DETECTED  LABBENZ NONE DETECTED  AMPHETMU NONE DETECTED  THCU NONE DETECTED  LABBARB NONE DETECTED    Alcohol Level  Recent Labs  Lab 09/25/20 0309  ETH <10    IMAGING  09/29/2020 CT head IMPRESSION: 1. New intraventricular hemorrhage in the third and lateral ventricles. Fourth ventricular and midbrain hemorrhage  is unchanged. 2. New right frontal EVD which traverses the right lateral ventricle. Blood clot encompasses the lower catheter and ventriculomegaly is similar to prior. 3. Small cortical infarcts are newly seen along the superior left frontal convexity.  09/26/2020 IR Angio IMPRESSION: 1. No evidence of an AVM, dural arteriovenous fistula, aneurysm or other vascular abnormality to explain patient's brainstem hemorrhage.   09/26/2020 CT head:  IMPRESSION: 1. Unchanged left brainstem hemorrhage extending into the fourth ventricle. 2. Worsening hydrocephalus.  09/25/2020 MRI brain w/wo, MR Venogram IMPRESSION: 1. Acute hemorrhage centered within the left dorsal midbrain extending into the dorsal pons and inferomedial thalamus with intraventricular extension. No abnormal enhancement to suggest underlying lesion. 2. Compression of the cerebral aqueduct with possible mild obstructive hydrocephalus. 3. No evidence of dural sinus thrombosis. Attenuation of the left basal vein in the quadrigeminal cistern without definite occlusion.  09/25/2020 CT Angio head/neck IMPRESSION: 1. No vascular malformation, anomaly or aneurysm. 2. No emergent large vessel occlusion or high-grade stenosis of the intracranial arteries. 3. Unchanged size of dorsal left pontine hemorrhage with intraventricular extension.  09/25/2020 CT head code stroke IMPRESSION: Acute intraparenchymal hemorrhage in  the dorsal left pons with subarachnoid extension into the cerebral aqueduct and fourth Ventricle.   PHYSICAL EXAM Blood pressure (!) 158/74, pulse 61, temperature 99.5 F (37.5 C), temperature source Axillary, resp. rate 15, height 6\' 2"  (1.88 m), weight 102.6 kg, SpO2 97 %.  General: drowsy but easily arousable, elderly caucasian male, no apparent distress. Soft spoken. Right frontal ventriculostomy catheter in place Lungs: Symmetrical Chest rise, no labored breathing Cardio: Regular Rate and Rhythm Abdomen: Soft, non-tender  Neurological Exam : drowsy but  arousable to voice, Speech slowed but appears to not have aphasia.  Able to follow all commands without difficulty with his upper extremities.  Cranial Nerves: II:  Visual fields grossly normal, pupils equal, round, reactive to light  III,IV, VI: ptosis not present, horizontal nystagmus, right and left eye unable to completely abduct V,VII: smile symmetric, facial light touch sensation normal bilaterally VIII: hearing normal bilaterally IX,X: uvula rises symmetrically XI: bilateral shoulder shrug XII: midline tongue extension without atrophy or fasciculations  Motor: Right upper extremity   3/5 with drift  Left upper extremity   4/5 Right lower extremity: flicker to noxious stimuli Left kower extremity: spontaneous 4/5    Tone and bulk:normal tone throughout; no atrophy noted    ASSESSMENT/PLAN [name] is a 65 y.o. male with history of atrial flutter newly diagnosed with atrial fibrillation in February 2022 not on anticoagulation, follows with Dr. March 2022 in cardiology.  He presented with right-sided weakness, slurred speech, headache, and vomiting.Last known well when he went to bed at 10 PM on 09/24/2020 and woke up at around 2 AM on the day of admission with slurred speech.  EMS was called.He was very drowsy upon their assessment and notable right side weakeness.  He had worsening mental status, GCS 5, so was  emergently intubated in ED for airway protection.  His MRI brain showed acute hemorrhage at left midbrain extending into pons with IVH associated with obstructive hydrocephalus.    ICH - Left Dorsalmidbrain ICHextending into dorsal pons and inferior thalamus with mild mass-effect on third ventricle and hydrocephalus,source unknown, suspect occult cavernoma  Code Stroke CT head-Acute IPH in the dorsal left pons with subarachnoid extension into the cerebral aqueduct and fourth ventricle.  CTA head & neck-No vascular malformation, anomaly or aneurysm. No emergent large vessel occlusion or high-grade stenosis. Unchanged size  of dorsal left pontine hemorrhage with intraventricular extension.  MRI& MR Venogram-Acute hemorrhage centered within the left dorsal midbrain extending into the dorsal pons and inferomedial thalamus with intraventricular extension. Compression of the cerebral aqueduct with possible mild obstructive hydrocephalus. No evidence of dural sinus thrombosis.   CT Head W/O Contrast 3/29 0250 -Unchanged left brainstem hemorrhage extending into the fourth ventricle. Worsening hydrocephalus.  Diagnostic Cerebral Angiogram - No evidence of an aneurysm, AVM, dural AV fistula or other vascular abnormality to explain patient's known intraparenchymal hemorrhage.  2D Echo- EF:55-60%. No wall motion abnormalities.  LDL- 88  HgbA1c5.7  VTE prophylaxis -Lovenox  No antithromboticprior to admission, now on Aspirin 81 mg. Follow up in clinic regarding further use of antiplatelet vs. anticoagulation.  Therapy recommendations:Pending  Disposition:Pending  Acute obstructive hydrocephalus   EVD placed on 3/30   CT 4/1 showed clot in right lateral ventricle around catheter.   S/p 1mg  (1cc) alteplase placed in EVD 09/29/20  EVD drain 10-20cc/h now  ICP measuring 4-9 with a good waveform  CT head in am  Cerebral Edema  Was on 3% NaCl at70 cc/hr, now decrease 3%  to 72ml/hr to avoid fluid overload  Sodium chloride 23.4% x1 now  Na goal is 150-155  Sodium level: 147->148->146->148  Follow sodium q6hr  Hypertension  Home meds:Diltiazem  Currently on Diltiazem 30 mg q6  BP goal Systolic < 160  Long-term BP goal normotensive  Aflutter and Afib  Not on [facility] before admission  Currently on ASA  Not AC candidate at this time.  Follow up in clinic regarding further use of antiplatelet vs. anticoagulation.  Hyperlipidemia  Home meds:none  LDL88, goal < 70  Addstatin once hemorrhage is stable  Continue statin at discharge  Dysphagia  Secondary to hemorrhagic stroke  NPO  SLP on board  Tube feeds decrease rate to 57ml/hr to avoid fluid overload  Drowsiness  Continue start Amantadine 100 mg BID    Community-acquired pneumonia with Moraxella  Sputum culture (3/30): Moraxella  Ceftriaxone 2g q 24hrs  Other Stroke Risk Factors  Advanced Age >/= 38   Lissy Olivencia-Simmons, ACNP-BC Stroke Nurse Practitioner 09/30/2020 1:20 pm  ATTENDING NOTE: I reviewed above note and agree with the assessment and plan. Pt was seen and examined.   Patient wife and daughter at bedside.  Patient lying in bed, lethargic, however more awake alert than yesterday, more interactive.  Able to answer orientation questions.  Performed examination together with NP, neuro exam refer to above.  EVD patent status post TPA yesterday, currently drainage about 10 to 20 cc/h.  Repeat CT in the a.m.  Sodium still on the lower end at 146 this morning, recommend 23.4% saline x1, reduce Suprane saline to 50 cc/h and tube feeding to 50 cc/h to avoid fluid overload.  PT/OT pending.  For detailed assessment and plan, please refer to above as I have made changes wherever appropriate.   11/30/2020, MD PhD Stroke Neurology 09/30/2020 8:38 PM  This patient is critically ill due to ICH with IVH and hydrocephalus status post EVD, cerebral edema,  dysphagia, afib and aflutter and at significant risk of neurological worsening, death form hematoma expansion, cerebral edema, brain herniation, obstructive hydrocephalus, seizure, heart failure, sepsis, heart failure and stroke. This patient's care requires constant monitoring of vital signs, hemodynamics, respiratory and cardiac monitoring, review of multiple databases, neurological assessment, discussion with family, other specialists and medical decision making of high complexity. I spent 45 minutes of neurocritical care time in the care  of this patient. I had long discussion with wife and daughter at bedside, updated pt current condition, treatment plan and potential prognosis, and answered all the questions.  They expressed understanding and appreciation.     To contact Stroke Continuity provider, please refer to [email or web address]. After hours, contact General Neurology

## 2020-09-30 NOTE — Evaluation (Signed)
SLP Cancellation Note  Patient Details Name: Walter [name] MRN: [id number] DOB: 09/23/1955   Cancelled treatment:       Reason Eval/Treat Not Completed: Other (comment);Fatigue/lethargy limiting ability to participate (pt lethargic, per RN Victorino Dike,  will continue efforts)  Rolena Infante, MS [facility] SLP Acute Rehab Services Office [phone] Pager [phone]   Chales Abrahams 09/30/2020, 11:56 AM

## 2020-09-30 NOTE — Progress Notes (Signed)
PT Cancellation Note  Patient Details Name: Walter [name] MRN: [id number] DOB: 01/03/1956   Cancelled Treatment:    Reason Eval/Treat Not Completed: Medical issues which prohibited therapy. Pt with recent EVD clotting and new IVH events. Per RN, CSF drainage is bloody and pt is lethargic. Will hold off on PT this date and follow-up another day as appropriate.   Raymond Gurney, PT, DPT Acute Rehabilitation Services  Pager: [phone] Office: [phone]    Jewel Baize 09/30/2020, 11:41 AM

## 2020-09-30 NOTE — Progress Notes (Addendum)
NAME:  Walter [name], MRN:  [id number], DOB:  [birth year], LOS: 5 ADMISSION DATE:  09/25/2020, CONSULTATION DATE:  09/25/20 REFERRING MD:  Wilford Corner,  CHIEF COMPLAINT:  Headache, r side weakness, slurred speech  History of Present Illness:  Walter [name] is a 65 yo man with hx of afib not on anticoagulation admitted with right-sided weakness, hemorrhagic stroke. Emergently intubated in the ED for airway protection  Pertinent  Medical History  Aflutter/afib (not on ac) S/p Ablation 03/2020, hx of Cardioversion 2015 Hx retinal detachment Covid infection in July 2021 treated with monoclonal antibody Hx CV   Significant Hospital Events: Including procedures, antibiotic start and stop dates in addition to other pertinent events   . 3/28- Admit, intubated.  Not a candidate for TPA due to intracranial hemorrhage.  Started 3% saline . 3/29- EVD placement for developing hydrocephalus. CTA of the head was unremarkable . 3/30- Extubated.  Interim History / Subjective:  Waxing and waning mental status EVD is draining well, still with bloody drainage  Remains on 3% saline  Objective   Blood pressure (!) 144/79, pulse 63, temperature 98.8 F (37.1 C), temperature source Oral, resp. rate 11, height 6\' 2"  (1.88 m), weight 102.6 kg, SpO2 96 %.        Intake/Output Summary (Last 24 hours) at 09/30/2020 1043 Last data filed at 09/30/2020 0900 Gross per 24 hour  Intake 3041.51 ml  Output 3291 ml  Net -249.49 ml   Filed Weights   09/25/20 0524 09/27/20 0429 09/30/20 0400  Weight: 101 kg 100 kg 102.6 kg    Examination: Gen:      Elderly Caucasian male, lying in the bed HEENT:  EOMI, sclera anicteric, EVD in place with bloody secretions.  Severely dry mucous membranes Neck:     No masses; no thyromegaly Lungs:    Clear to auscultation bilaterally; normal respiratory effort CV:         Regular rate and rhythm; no murmurs Abd:      + bowel sounds; soft, non-tender; no palpable masses, no  distension Ext:    No edema; adequate peripheral perfusion Skin:      Warm and dry; no rash Neuro:   Lethargic, opens eyes with vocal stimuli, convergent gaze, following few commands, antigravity on left upper and bilateral lower extremity, 2/5 on right upper extremity  Labs/imaging that I havepersonally reviewed  (right click and "Reselect all SmartList Selections" daily)  Sodium 146 Tracheal aspirate is growing Moraxella  CT head 4/1: New intraventricular hemorrhage in the third and lateral ventricles. Fourth ventricular and midbrain hemorrhage is unchanged. 2. New right frontal EVD which traverses the right lateral ventricle. Blood clot encompasses the lower catheter and ventriculomegaly is similar to prior. 3. Small cortical infarcts are newly seen along the superior left frontal convexity  Resolved Hospital Problem list     Assessment & Plan:  Acute midbrain/dorsal pons/thalamic intraparenchymal hemorrhage with IVH, ICH score of 2 Acute obstructive hydrocephalus s/p EVD EVD is draining after TPA instillation in the tubes Continue 3% saline, serum sodium goal 155-165, currently serum sodium is 148  stroke team is following  Acute encephalopathy due to ICH Avoid sedation Closely monitor mental status  Acute respiratory failure due to stroke Extubated on 3/30 Continue to monitor mental status Continue nasal cannula oxygen with O2 sat goal 92% Able to protect his airway with a strong cough  Community-acquired pneumonia with Moraxella Sputum culture grew Moraxella X-ray chest is suggestive of right lower lobe infiltrate Continue IV ceftriaxone  to complete 7 days therapy  Paroxysmal atrial fibrillation, not on anticoagulation as an outpatient Continue home diltiazem 30mg  q6h  Best Practice (right click and "Reselect all SmartList Selections" daily)   Diet:  Tube Feed  Pain/Anxiety/Delirium protocol (if indicated): N/A VAP protocol (if indicated): Not indicated DVT  prophylaxis: SCD GI prophylaxis: PPI Glucose control:  SSI Yes Central venous access:  N/A Arterial line:  N/A Foley:  N/A Mobility:  bed rest  PT consulted: Yes Code Status:  full code Prognosis: Guarded Last date of multidisciplinary goals of care discussion [Per primary team]   Total critical care time: 41 minutes  Performed by:   Critical care time was exclusive of separately billable procedures and treating other patients.   Critical care was necessary to treat or prevent imminent or life-threatening deterioration.   Critical care was time spent personally by me on the following activities: development of treatment plan with patient and/or surrogate as well as nursing, discussions with consultants, evaluation of patient's response to treatment, examination of patient, obtaining history from patient or surrogate, ordering and performing treatments and interventions, ordering and review of laboratory studies, ordering and review of radiographic studies, pulse oximetry and re-evaluation of patient's condition.   Cheri Fowler MD [facility] See Amion for pager If no response to pager, please call [phone] until 7pm After 7pm, Please call E-link [phone]

## 2020-09-30 NOTE — Progress Notes (Signed)
NEUROSURGERY PROGRESS NOTE  No acute events overnight. ICP measuring 4 with a good waveform. Patient is lethargic and difficult to arouse, neuro status waxes and wains. EVD draining well and patent with good pulsations in catheter.   Temp:  [98.3 F (36.8 C)-99.1 F (37.3 C)] 98.8 F (37.1 C) (04/02 0400) Pulse Rate:  [32-91] 63 (04/02 0700) Resp:  [13-20] 20 (04/02 0700) BP: (132-155)/(55-98) 150/87 (04/02 0700) SpO2:  [90 %-96 %] 94 % (04/02 0700) Weight:  [102.6 kg] 102.6 kg (04/02 0400)   Sherryl Manges, NP 09/30/2020 9:11 AM

## 2020-10-01 ENCOUNTER — Inpatient Hospital Stay: Payer: Self-pay

## 2020-10-01 ENCOUNTER — Inpatient Hospital Stay (HOSPITAL_COMMUNITY): Payer: Medicare Other

## 2020-10-01 DIAGNOSIS — I613 Nontraumatic intracerebral hemorrhage in brain stem: Secondary | ICD-10-CM | POA: Diagnosis not present

## 2020-10-01 DIAGNOSIS — E870 Hyperosmolality and hypernatremia: Secondary | ICD-10-CM | POA: Diagnosis not present

## 2020-10-01 DIAGNOSIS — G9340 Encephalopathy, unspecified: Secondary | ICD-10-CM | POA: Diagnosis not present

## 2020-10-01 DIAGNOSIS — I480 Paroxysmal atrial fibrillation: Secondary | ICD-10-CM | POA: Diagnosis not present

## 2020-10-01 DIAGNOSIS — Z452 Encounter for adjustment and management of vascular access device: Secondary | ICD-10-CM

## 2020-10-01 DIAGNOSIS — J9602 Acute respiratory failure with hypercapnia: Secondary | ICD-10-CM | POA: Diagnosis not present

## 2020-10-01 DIAGNOSIS — I629 Nontraumatic intracranial hemorrhage, unspecified: Secondary | ICD-10-CM | POA: Diagnosis not present

## 2020-10-01 LAB — SODIUM
Sodium: 146 mmol/L — ABNORMAL HIGH (ref 135–145)
Sodium: 145 mmol/L (ref 135–145)
Sodium: 148 mmol/L — ABNORMAL HIGH (ref 135–145)

## 2020-10-01 LAB — CBC
HCT: 45.8 % (ref 39.0–52.0)
Hemoglobin: 15.4 g/dL (ref 13.0–17.0)
MCH: 31.5 pg (ref 26.0–34.0)
MCHC: 33.6 g/dL (ref 30.0–36.0)
MCV: 93.7 fL (ref 80.0–100.0)
Platelets: 172 10*3/uL (ref 150–400)
RBC: 4.89 MIL/uL (ref 4.22–5.81)
RDW: 13.1 % (ref 11.5–15.5)
WBC: 10.7 10*3/uL — ABNORMAL HIGH (ref 4.0–10.5)
nRBC: 0 % (ref 0.0–0.2)

## 2020-10-01 LAB — BASIC METABOLIC PANEL
Anion gap: 10 (ref 5–15)
BUN: 14 mg/dL (ref 8–23)
CO2: 24 mmol/L (ref 22–32)
Calcium: 9.2 mg/dL (ref 8.9–10.3)
Chloride: 111 mmol/L (ref 98–111)
Creatinine, Ser: 0.74 mg/dL (ref 0.61–1.24)
GFR, Estimated: >60 mL/min (ref >60)
Glucose, Bld: 151 mg/dL — ABNORMAL HIGH (ref 70–99)
Potassium: 3.8 mmol/L (ref 3.5–5.1)
Sodium: 145 mmol/L (ref 135–145)

## 2020-10-01 LAB — GLUCOSE, CAPILLARY
Glucose-Capillary: 145 mg/dL — ABNORMAL HIGH (ref 70–99)
Glucose-Capillary: 173 mg/dL — ABNORMAL HIGH (ref 70–99)
Glucose-Capillary: 181 mg/dL — ABNORMAL HIGH (ref 70–99)
Glucose-Capillary: 204 mg/dL — ABNORMAL HIGH (ref 70–99)
Glucose-Capillary: 172 mg/dL — ABNORMAL HIGH (ref 70–99)
Glucose-Capillary: 202 mg/dL — ABNORMAL HIGH (ref 70–99)

## 2020-10-01 MED ORDER — SODIUM CHLORIDE 0.9% FLUSH
10.0000 mL | Freq: Two times a day (BID) | INTRAVENOUS | Status: DC
  Administered 2020-10-02 – 2020-10-07 (×9): 10 mL

## 2020-10-01 MED ORDER — INSULIN ASPART 100 UNIT/ML ~~LOC~~ SOLN
0.0000 [IU] | SUBCUTANEOUS | Status: DC
  Administered 2020-10-01 – 2020-10-02 (×3): 3 [IU] via SUBCUTANEOUS
  Administered 2020-10-02: 5 [IU] via SUBCUTANEOUS
  Administered 2020-10-02: 3 [IU] via SUBCUTANEOUS
  Administered 2020-10-02: 5 [IU] via SUBCUTANEOUS
  Administered 2020-10-02: 2 [IU] via SUBCUTANEOUS
  Administered 2020-10-03: 5 [IU] via SUBCUTANEOUS
  Administered 2020-10-03 (×3): 3 [IU] via SUBCUTANEOUS
  Administered 2020-10-03: 2 [IU] via SUBCUTANEOUS
  Administered 2020-10-03 – 2020-10-04 (×2): 5 [IU] via SUBCUTANEOUS
  Administered 2020-10-04: 3 [IU] via SUBCUTANEOUS
  Administered 2020-10-04 (×2): 2 [IU] via SUBCUTANEOUS
  Administered 2020-10-04: 3 [IU] via SUBCUTANEOUS
  Administered 2020-10-04: 2 [IU] via SUBCUTANEOUS
  Administered 2020-10-05 (×4): 3 [IU] via SUBCUTANEOUS
  Administered 2020-10-05 (×2): 5 [IU] via SUBCUTANEOUS
  Administered 2020-10-06: 3 [IU] via SUBCUTANEOUS
  Administered 2020-10-06 (×2): 5 [IU] via SUBCUTANEOUS

## 2020-10-01 MED ORDER — SODIUM CHLORIDE 3 % IV SOLN
INTRAVENOUS | Status: DC
  Filled 2020-10-01 (×3): qty 500

## 2020-10-01 MED ORDER — LACTULOSE 10 GM/15ML PO SOLN
30.0000 g | ORAL | Status: DC
  Administered 2020-10-01 (×4): 30 g
  Filled 2020-10-01 (×4): qty 45

## 2020-10-01 MED ORDER — ALTEPLASE 2 MG IJ SOLR
2.0000 mg | Freq: Once | INTRAMUSCULAR | Status: AC
  Administered 2020-10-01: 2 mg

## 2020-10-01 MED ORDER — VITAL 1.5 CAL PO LIQD
1000.0000 mL | ORAL | Status: DC
  Administered 2020-10-02 – 2020-10-19 (×12): 1000 mL
  Filled 2020-10-01 (×14): qty 1000

## 2020-10-01 MED ORDER — SODIUM CHLORIDE 0.9% FLUSH
10.0000 mL | INTRAVENOUS | Status: DC | PRN
Start: 2020-10-01 — End: 2020-10-08

## 2020-10-01 MED ORDER — SODIUM CHLORIDE 23.4 % INJECTION (4 MEQ/ML) FOR IV ADMINISTRATION: 120.0000 meq | Freq: Once | INTRAVENOUS | Status: DC

## 2020-10-01 NOTE — Progress Notes (Signed)
Peripherally Inserted Central Catheter Placement  The IV Nurse has discussed with the patient and/or persons authorized to consent for the patient, the purpose of this procedure and the potential benefits and risks involved with this procedure.  The benefits include less needle sticks, lab draws from the catheter, and the patient may be discharged home with the catheter. Risks include, but not limited to, infection, bleeding, blood clot (thrombus formation), and puncture of an artery; nerve damage and irregular heartbeat and possibility to perform a PICC exchange if needed/ordered by physician.  Alternatives to this procedure were also discussed.  Bard Power PICC patient education guide, fact sheet on infection prevention and patient information card has been provided to patient /or left at bedside.  Phone consent obtain from wife. PICC Placement Documentation  PICC Double Lumen 10/01/20 PICC Right Basilic 41 cm 0 cm (Active)  Indication for Insertion or Continuance of Line Vasoactive infusions 10/01/20 1635  Exposed Catheter (cm) 0 cm 10/01/20 1635  Site Assessment Clean;Dry;Intact 10/01/20 1635  Lumen #1 Status Flushed;Saline locked;Blood return noted 10/01/20 1635  Lumen #2 Status Flushed;Saline locked;Blood return noted 10/01/20 1635  Dressing Type Transparent 10/01/20 1635  Dressing Status Clean;Dry;Intact 10/01/20 1635  Antimicrobial disc in place? Yes 10/01/20 1635  Safety Lock Not Applicable 10/01/20 1635  Dressing Intervention New dressing 10/01/20 1635  Dressing Change Due 10/08/20 10/01/20 1635       Ethelda Chick 10/01/2020, 4:38 PM

## 2020-10-01 NOTE — Progress Notes (Addendum)
Spoke with Meyran NP on call for neuro surgery at 0340. Made her aware that EVD has minimal output since 0200 this morning and ICP monitor waveform is dampened. Patient oriented to person, following simple commands and MAE but still very lethargic requiring a lot of stimulation to arouse. Pt took for head CT and Neurosx currently awaiting results.

## 2020-10-01 NOTE — Progress Notes (Signed)
SLP Cancellation Note  Patient Details Name: Walter [name] MRN: [id number] DOB: 1955-08-13   Cancelled treatment:       Reason Eval/Treat Not Completed: Patient's level of consciousness; Pt remains very lethargic; will continue to monitor.    Ardyth Gal MA, CCC-SLP Acute Rehabilitation Services   10/01/2020, 9:03 AM

## 2020-10-01 NOTE — Progress Notes (Signed)
Patient is overall stable.  He is lethargic or even somnolent with snoring respirations but is protecting his airway.  I saw him with the critical care medicine physician.  He does arouse and will open his eyes and follow commands.  He lifts his left arm off the bed and moves his right arm somewhat.  He can move his left leg better than his right.  At some point the ventriculostomy drain stopped draining in the middle of the night.  There was a note that they reached out to neurosurgery but they did not.  There was an order by a nonneurosurgical PA, I suspect a neurology, they ordered a CT scan.  We have reviewed that CT scan and there has been resolution of most of the intraventricular hemorrhage.  The ventriculostomy drain is in perfect position.  There is no hydrocephalus.  We agree with the report.  I flushed the drain both distally and proximally.  At first there was significant resistance to flushing proximally.  We put a very small amount of TPA, likely a half of a milligram, into the proximal portion of the ventriculostomy and flushed it and kept clamped for 30 minutes.  It is now draining nicely.  His ICP is 10.  Not sure how much he really needs the ventriculostomy anymore but it is helping to maintain a lower ICP I suspect.  I had a long discussion with his wife regarding all of this.  She seemed to demonstrate understanding.

## 2020-10-01 NOTE — Progress Notes (Signed)
PT Cancellation Note  Patient Details Name: Walter [name] MRN: [id number] DOB: March 16, 1956   Cancelled Treatment:    Reason Eval/Treat Not Completed: Medical issues which prohibited therapy. Pt very lethargic with low arousal. Planning for more tPA to be pushed through EVD catheter as it may have clotted again. Chart review, min EVD output and ICP waveform dampened early this morning. RN reporting continued bloody CSF. Will follow-up another day as appropriate.   Raymond Gurney, PT, DPT Acute Rehabilitation Services  Pager: [phone] Office: [phone]    Jewel Baize 10/01/2020, 8:43 AM

## 2020-10-01 NOTE — Progress Notes (Addendum)
STROKE TEAM PROGRESS NOTE   INTERVAL HISTORY  EVD stopped draining overnight.  CT head was obtained which showed resolution of most of the intraventricular hemorrhage and improved hydrocephalus. The EVD was flushed this morning by neurosurgery.  Small amount of tPA was instilled in the proximal portion of the ventricluostomy and clamped for 30 minutes.  The EVD is now draining.  ICP range 5-11.  Repeat head CT shows new hemorrhage along the EVD tract an in the right lateral ventricle.      The patient opens his eyes briefly. Drowsy. He is oriented to place and year of birth.  His voice is soft.  He denies any headaches.  He opens his mouth to command.  Does not stick out tongue.  Eyes midline.  inconsistently blinking to visual threat. Localizes to pain bilateral UEs (antigravity), LLE spontaneous, RLE withdraw to pain.  Vitals:   10/01/20 1100 10/01/20 1200 10/01/20 1300 10/01/20 1400  BP: (!) 165/105 (!) 148/92 (!) 145/90 (!) 144/94  Pulse: 96 (!) 120 (!) 111 94  Resp: (!) 23 (!) 28 (!) 26 (!) 22  Temp:  98.3 F (36.8 C)    TempSrc:  Axillary    SpO2: 99% 99% 96% 92%  Weight:      Height:       CBC:  Recent Labs  Lab 09/25/20 0309 09/25/20 0328 09/30/20 0317 10/01/20 0535  WBC 6.7   < > 9.6 10.7*  NEUTROABS 2.2  --   --   --   HGB 15.3   < > 13.6 15.4  HCT 45.7   < > 41.8 45.8  MCV 93.5   < > 97.2 93.7  PLT 214   < > 145* 172   < > = values in this interval not displayed.   Basic Metabolic Panel:  Recent Labs  Lab 09/27/20 1602 09/27/20 2245 09/28/20 0512 09/28/20 1106 09/30/20 0317 09/30/20 1046 10/01/20 0535 10/01/20 1048  NA 155*   < > 151*   < > 146*   < > 145 146*  K  --   --   --    < > 3.6  --  3.8  --   CL  --   --   --    < > 116*  --  111  --   CO2  --   --   --    < > 21*  --  24  --   GLUCOSE  --   --   --    < > 139*  --  151*  --   BUN  --   --   --    < > 16  --  14  --   CREATININE  --   --   --    < > 0.74  --  0.74  --   CALCIUM  --   --   --     < > 8.8*  --  9.2  --   MG 2.1  --  2.1  --   --   --   --   --   PHOS 2.5  --  3.3  --   --   --   --   --    < > = values in this interval not displayed.   Lipid Panel:  Recent Labs  Lab 09/25/20 1659  CHOL 155  TRIG 117  HDL 44  CHOLHDL 3.5  VLDL 23  LDLCALC 88   HgbA1c:  Recent Labs  Lab 09/25/20 0309  HGBA1C 5.7*   Urine Drug Screen:  Recent Labs  Lab 09/25/20 0304  LABOPIA NONE DETECTED  COCAINSCRNUR NONE DETECTED  LABBENZ NONE DETECTED  AMPHETMU NONE DETECTED  THCU NONE DETECTED  LABBARB NONE DETECTED    Alcohol Level  Recent Labs  Lab 09/25/20 0309  ETH <10    IMAGING  09/29/2020 CT head IMPRESSION: 1. New intraventricular hemorrhage in the third and lateral ventricles. Fourth ventricular and midbrain hemorrhage is unchanged. 2. New right frontal EVD which traverses the right lateral ventricle. Blood clot encompasses the lower catheter and ventriculomegaly is similar to prior. 3. Small cortical infarcts are newly seen along the superior left frontal convexity.  09/26/2020 IR Angio IMPRESSION: 1. No evidence of an AVM, dural arteriovenous fistula, aneurysm or other vascular abnormality to explain patient's brainstem hemorrhage.   09/26/2020 CT head:  IMPRESSION: 1. Unchanged left brainstem hemorrhage extending into the fourth ventricle. 2. Worsening hydrocephalus.  09/25/2020 MRI brain w/wo, MR Venogram IMPRESSION: 1. Acute hemorrhage centered within the left dorsal midbrain extending into the dorsal pons and inferomedial thalamus with intraventricular extension. No abnormal enhancement to suggest underlying lesion. 2. Compression of the cerebral aqueduct with possible mild obstructive hydrocephalus. 3. No evidence of dural sinus thrombosis. Attenuation of the left basal vein in the quadrigeminal cistern without definite occlusion.  09/25/2020 CT Angio head/neck IMPRESSION: 1. No vascular malformation, anomaly or aneurysm. 2. No  emergent large vessel occlusion or high-grade stenosis of the intracranial arteries. 3. Unchanged size of dorsal left pontine hemorrhage with intraventricular extension.  09/25/2020 CT head code stroke IMPRESSION: Acute intraparenchymal hemorrhage in the dorsal left pons with subarachnoid extension into the cerebral aqueduct and fourth Ventricle.   PHYSICAL EXAM Blood pressure (!) 144/94, pulse 94, temperature 98.3 F (36.8 C), temperature source Axillary, resp. rate (!) 22, height 6\' 2"  (1.88 m), weight 102.1 kg, SpO2 92 %.  General: drowsy but easily arousable, elderly caucasian male, no apparent distress. Soft spoken. Right frontal ventriculostomy catheter in place Lungs: Symmetrical Chest rise, no labored breathing Cardio: Regular Rate and Rhythm Abdomen: Soft, non-tender  Neurological Exam : drowsy but  arousable to voice, Speech slowed and soft but appears to not have aphasia.  Unable to follow commands today   Cranial Nerves: II:  Unable to assess visual fields, inconsistently blinking to visual threat, pupils equal, round, reactive to light  III,IV, VI: ptosis not present, eyes midline with forced eye opening, not tracking V,VII: face appears symmetric, unable to assess facial sensation due to drowsiness VIII: hearing normal bilaterally IX,X: unable to assess, not cooperative XI: unable to assess shoulder shrug, not cooperative XII: does not stick out tongue, not cooperative  Motor: Right upper extremity  Localize 3/5  Left upper extremity  localize 3/5 Right lower extremity:withdraws to noxious stimuli Left lower extremity: spontaneous 2/5    Tone and bulk:normal tone throughout; no atrophy noted    ASSESSMENT/PLAN [name] is a 65 y.o. male with history of atrial flutter newly diagnosed with atrial fibrillation in February 2022 not on anticoagulation, follows with Dr. March 2022 in cardiology.  He presented with right-sided weakness, slurred speech,  headache, and vomiting.Last known well when he went to bed at 10 PM on 09/24/2020 and woke up at around 2 AM on the day of admission with slurred speech.  EMS was called.He was very drowsy upon their assessment and notable right side weakeness.  He had worsening mental status, GCS 5, so was emergently  intubated in ED for airway protection.  His MRI brain showed acute hemorrhage at left midbrain extending into pons with IVH associated with obstructive hydrocephalus.    ICH - Left Dorsalmidbrain ICHextending into dorsal pons and inferior thalamus with mild mass-effect on third ventricle and hydrocephalus,source unknown, suspect occult cavernoma  Code Stroke CT head-Acute IPH in the dorsal left pons with subarachnoid extension into the cerebral aqueduct and fourth ventricle.  CTA head & neck-No vascular malformation, anomaly or aneurysm. No emergent large vessel occlusion or high-grade stenosis. Unchanged size of dorsal left pontine hemorrhage with intraventricular extension.  MRI& MR Venogram-Acute hemorrhage centered within the left dorsal midbrain extending into the dorsal pons and inferomedial thalamus with intraventricular extension. Compression of the cerebral aqueduct with possible mild obstructive hydrocephalus. No evidence of dural sinus thrombosis.   CT Head W/O Contrast 3/29 0250 -Unchanged left brainstem hemorrhage extending into the fourth ventricle. Worsening hydrocephalus.  Diagnostic Cerebral Angiogram - No evidence of an aneurysm, AVM, dural AV fistula or other vascular abnormality to explain patient's known intraparenchymal hemorrhage.  2D Echo- EF:55-60%. No wall motion abnormalities.  LDL- 88  HgbA1c5.7  VTE prophylaxis -Lovenox  No antithromboticprior to admission, now on Aspirin 81 mg. Follow up in clinic regarding further use of antiplatelet vs. anticoagulation.  Therapy recommendations:Pending  Disposition:Pending  Acute obstructive  hydrocephalus   EVD placed on 3/30   CT 4/1 showed clot in right lateral ventricle around catheter.   S/p 1mg  (1cc) alteplase placed in EVD 09/29/20 -> drain well ->ICP measuring 4-11 with a good waveform  CT head (4/3) resolution of most of the intraventricular hemorrhage and improved hydrocephalus.  EVD stopped working 4/2 overnight. 0.5mg  alteplase placed in EVD 10/01/2020 -> Post alteplase CT head new hemorrhage along the EVD tract an in the right lateral ventricle.  Cerebral Edema  PICC line placement 4/3 for hypertonic saline solution  Resume 3% 50->43ml/hr after line placed   Na goal is 150-155  Sodium level: 147->148->146->148->145  Follow sodium q6hr  Hypertension  Home meds:Diltiazem  Currently on Diltiazem 30 mg q6  BP goal Systolic < 160  Long-term BP goal normotensive  Aflutter and Afib  Not on [facility] before admission  Currently on ASA  Not AC candidate at this time.  Follow up in clinic regarding further use of antiplatelet vs. anticoagulation.  Hyperlipidemia  Home meds:none  LDL88, goal < 70  Addstatin once hemorrhage is stable  Continue statin at discharge  Dysphagia  Secondary to hemorrhagic stroke  NPO  SLP on board  Tube feeds @ 16ml/hr   Drowsiness  Continue Amantadine 100 mg BID    Community-acquired pneumonia with Moraxella  Sputum culture (3/30): Moraxella  Ceftriaxone 2g q 24hrs  Other Stroke Risk Factors  Advanced Age >/= 31   Lissy Olivencia-Simmons, ACNP-BC Stroke Nurse Practitioner 10/01/2020   ATTENDING NOTE: I reviewed above note and agree with the assessment and plan. Pt was seen and examined.   Wife at bedside, pt today drowsy sleepy and difficult waking up, eyes closed and difficulty keep open, following limited commands. Able to answer a couple of orientated questions with very soft voice, but they were correct. With forced eye opening, eyes in mid position, inconsistently blinking to visual  threat, doll's eyes present, not tracking, PERRL. No significant facial asymmetry.  Tongue protrusion not cooperative. On pain stimulation, localizing to pain bilateral UEs, both against gravity. LLE withdraw to pain stronger than right LE. Sensation, coordination not cooperative and gait not tested.  Repeat CT  in the morning showed improved IVH and hydrocephalus with clear third and fourth ventricle and smaller ventricle size.  However, EVD stopped working overnight. NSG flushed tube with small amount of TPA, and EVD started draining again with bloody CSF.  Repeat CT head showed reemerged of right lateral ventricle and third ventricle IVH as well as new hemorrhage along the EVD tract.  Ventricle size stable so far.  Need continued close monitoring. Sodium 146->145, PICC line placed, decreased 3% saline to 25 cc an increase tube feeding to 65 cc.   For detailed assessment and plan, please refer to above as I have made changes wherever appropriate.   Marvel Plan, MD PhD Stroke Neurology 10/01/2020 5:41 PM  This patient is critically ill due to brainstem hemorrhage, IVH status post EVD, hydrocephalus, dysphagia and at significant risk of neurological worsening, death form hematoma expansion, obstructive hydrocephalus, persistent IVH, seizure, aspiration, sepsis. This patient's care requires constant monitoring of vital signs, hemodynamics, respiratory and cardiac monitoring, review of multiple databases, neurological assessment, discussion with family, other specialists and medical decision making of high complexity. I spent 40 minutes of neurocritical care time in the care of this patient. I had long discussion with wife at bedside, updated pt current condition, treatment plan and potential prognosis, and answered all the questions.  She expressed understanding and appreciation.  I also discussed with Dr. Merrily Pew.    To contact Stroke Continuity provider, please refer to [email or web address]. After hours, contact  General Neurology

## 2020-10-01 NOTE — Progress Notes (Signed)
CT head reviewed. New hemorrhage along the EVD tract and in lareral vent. Vents decompressed, EVD working and ICP normal. Will observe for now. No new intervention necessary.

## 2020-10-01 NOTE — Progress Notes (Signed)
eLink Physician-Brief Progress Note Patient Name: Walter [name] DOB: Jan 16, 1956 MRN: [id number]   Date of Service  10/01/2020  HPI/Events of Note  RN requests ABG to rule out hypercarbia as patient is drowsy (also noted in Neuro note from 1400 hrs today).  Also requests addition of ISS for hyperglycemia.   eICU Interventions  Ordered ABG and Q4H ISS while NPO.     Intervention Category Intermediate Interventions: Other:  Janae Bridgeman 10/01/2020, 8:36 PM

## 2020-10-01 NOTE — Progress Notes (Signed)
NAME:  BREANNA MCDANIEL, MRN:  [id number], DOB:  03-Sep-1955, LOS: 6 ADMISSION DATE:  09/25/2020, CONSULTATION DATE:  09/25/20 REFERRING MD:  Wilford Corner,  CHIEF COMPLAINT:  Headache, r side weakness, slurred speech  History of Present Illness:  Mr. Wambold is a 65 yo man with hx of afib not on anticoagulation admitted with right-sided weakness, hemorrhagic stroke. Emergently intubated in the ED for airway protection  Pertinent  Medical History  Aflutter/afib (not on ac) S/p Ablation 03/2020, hx of Cardioversion 2015 Hx retinal detachment Covid infection in July 2021 treated with monoclonal antibody Hx CV   Significant Hospital Events: Including procedures, antibiotic start and stop dates in addition to other pertinent events   . 3/28- Admit, intubated.  Not a candidate for TPA due to intracranial hemorrhage.  Started 3% saline . 3/29- EVD placement for developing hydrocephalus. CTA of the head was unremarkable . 3/30- Extubated. . 4/1 respiratory culture grew Moraxella, on ceftriaxone  Interim History / Subjective:  Remain lethargic EVD is not draining well, repeat head CT showed improvement in hydrocephalus and IVH Still on 3% saline  Objective   Blood pressure (!) 146/96, pulse (!) 116, temperature 98.3 F (36.8 C), temperature source Axillary, resp. rate (!) 26, height 6\' 2"  (1.88 m), weight 102.1 kg, SpO2 100 %.        Intake/Output Summary (Last 24 hours) at 10/01/2020 0929 Last data filed at 10/01/2020 0700 Gross per 24 hour  Intake 1640.08 ml  Output 3540 ml  Net -1899.92 ml   Filed Weights   09/27/20 0429 09/30/20 0400 10/01/20 0400  Weight: 100 kg 102.6 kg 102.1 kg    Examination: Gen:      Elderly Caucasian male, lying in the bed HEENT:  EOMI, sclera anicteric, EVD in place, not draining well. Severely dry mucous membranes Neck:     No masses; no thyromegaly Lungs:    Clear to auscultation bilaterally; normal respiratory effort CV:         Regular rate and rhythm; no  murmurs Abd:      + bowel sounds; soft, non-tender; no palpable masses, no distension Ext:    No edema; adequate peripheral perfusion Skin:      Warm and dry; no rash Neuro:   Lethargic, opens eyes with vocal stimuli, convergent gaze, following few commands, antigravity on left upper and bilateral lower extremity, 2/5 on right upper extremity  Labs/imaging that I havepersonally reviewed  (right click and "Reselect all SmartList Selections" daily)  Sodium 146 Tracheal aspirate is growing Moraxella  CT head 4/1: New intraventricular hemorrhage in the third and lateral ventricles. Fourth ventricular and midbrain hemorrhage is unchanged. 2. New right frontal EVD which traverses the right lateral ventricle. Blood clot encompasses the lower catheter and ventriculomegaly is similar to prior. 3. Small cortical infarcts are newly seen along the superior left frontal convexity  CT head 4/3: 1. Stable right superior frontal approach EVD with decreased volume of intraventricular hemorrhage and improved ventricular decompression from 2 days ago.  2. Small infarcts have developed in both the left superior frontal gyrus and the left cerebellum PICA territory since the MRI 09/25/2020, not significantly changed from 09/29/2020.  3. Left dorsal midbrain intra-axial hemorrhage has not significantly changed since presentation. Associated brainstem edema.  4. Stable trace subarachnoid hemorrhage is stable and may be related to EVD placement.  Resolved Hospital Problem list     Assessment & Plan:  Acute midbrain/dorsal pons/thalamic intraparenchymal hemorrhage with IVH, ICH score of 2 Acute obstructive  hydrocephalus s/p EVD EVD has stopped draining, again TPA installed in the tubes Continue 3% saline, serum sodium goal 155-165, currently serum sodium is 145 stroke team is following Continue secondary stroke prophylaxis Now EVD is clamped, per neurosurgery repeat CT head in few hours  Acute  encephalopathy due to ICH Avoid sedation Closely monitor mental status  Acute respiratory failure due to stroke Extubated on 3/30 Continue to monitor mental status, able to protect his airway with a strong cough Continue nasal cannula oxygen with O2 sat goal 95%  Community-acquired pneumonia with Moraxella Sputum culture grew Moraxella Continue IV ceftriaxone to complete 7 days therapy  Paroxysmal atrial fibrillation, not on anticoagulation as an outpatient Continue home diltiazem 30mg  q6h  Best Practice (right click and "Reselect all SmartList Selections" daily)   Diet:  Tube Feed  Pain/Anxiety/Delirium protocol (if indicated): N/A VAP protocol (if indicated): Not indicated DVT prophylaxis: SCD GI prophylaxis: PPI Glucose control:  SSI Yes Central venous access:  N/A Arterial line:  N/A Foley:  N/A Mobility:  bed rest  PT consulted: Yes Code Status:  full code Prognosis: Guarded Last date of multidisciplinary goals of care discussion [Per primary team]   Total critical care time: 35 minutes  Performed by:   Critical care time was exclusive of separately billable procedures and treating other patients.   Critical care was necessary to treat or prevent imminent or life-threatening deterioration.   Critical care was time spent personally by me on the following activities: development of treatment plan with patient and/or surrogate as well as nursing, discussions with consultants, evaluation of patient's response to treatment, examination of patient, obtaining history from patient or surrogate, ordering and performing treatments and interventions, ordering and review of laboratory studies, ordering and review of radiographic studies, pulse oximetry and re-evaluation of patient's condition.   Cheri Fowler MD [facility] See Amion for pager If no response to pager, please call [phone] until 7pm After 7pm, Please call E-link [phone]

## 2020-10-02 ENCOUNTER — Inpatient Hospital Stay (HOSPITAL_COMMUNITY): Payer: Medicare Other

## 2020-10-02 ENCOUNTER — Inpatient Hospital Stay (HOSPITAL_COMMUNITY): Payer: Medicare Other | Admitting: Anesthesiology

## 2020-10-02 ENCOUNTER — Ambulatory Visit: Payer: BLUE CROSS/BLUE SHIELD | Admitting: Internal Medicine

## 2020-10-02 DIAGNOSIS — R002 Palpitations: Secondary | ICD-10-CM

## 2020-10-02 DIAGNOSIS — Z978 Presence of other specified devices: Secondary | ICD-10-CM | POA: Diagnosis not present

## 2020-10-02 DIAGNOSIS — I480 Paroxysmal atrial fibrillation: Secondary | ICD-10-CM | POA: Diagnosis not present

## 2020-10-02 DIAGNOSIS — J9602 Acute respiratory failure with hypercapnia: Secondary | ICD-10-CM | POA: Diagnosis not present

## 2020-10-02 DIAGNOSIS — I629 Nontraumatic intracranial hemorrhage, unspecified: Secondary | ICD-10-CM | POA: Diagnosis not present

## 2020-10-02 DIAGNOSIS — R0683 Snoring: Secondary | ICD-10-CM

## 2020-10-02 DIAGNOSIS — I483 Typical atrial flutter: Secondary | ICD-10-CM

## 2020-10-02 DIAGNOSIS — N179 Acute kidney failure, unspecified: Secondary | ICD-10-CM

## 2020-10-02 DIAGNOSIS — G9340 Encephalopathy, unspecified: Secondary | ICD-10-CM | POA: Diagnosis not present

## 2020-10-02 LAB — POCT I-STAT 7, (LYTES, BLD GAS, ICA,H+H)
Acid-base deficit: 1 mmol/L (ref 0.0–2.0)
Acid-base deficit: 1 mmol/L (ref 0.0–2.0)
Acid-base deficit: 5 mmol/L — ABNORMAL HIGH (ref 0.0–2.0)
Bicarbonate: 24.4 mmol/L (ref 20.0–28.0)
Bicarbonate: 24.1 mmol/L (ref 20.0–28.0)
Bicarbonate: 18.9 mmol/L — ABNORMAL LOW (ref 20.0–28.0)
Calcium, Ion: 1.32 mmol/L (ref 1.15–1.40)
Calcium, Ion: 1.29 mmol/L (ref 1.15–1.40)
Calcium, Ion: 1.29 mmol/L (ref 1.15–1.40)
HCT: 46 % (ref 39.0–52.0)
HCT: 43 % (ref 39.0–52.0)
HCT: 40 % (ref 39.0–52.0)
Hemoglobin: 15.6 g/dL (ref 13.0–17.0)
Hemoglobin: 14.6 g/dL (ref 13.0–17.0)
Hemoglobin: 13.6 g/dL (ref 13.0–17.0)
O2 Saturation: 91 %
O2 Saturation: 100 %
O2 Saturation: 98 %
Patient temperature: 99.5
Patient temperature: 102.6
Patient temperature: 98.1
Potassium: 3.8 mmol/L (ref 3.5–5.1)
Potassium: 4 mmol/L (ref 3.5–5.1)
Potassium: 3.7 mmol/L (ref 3.5–5.1)
Sodium: 151 mmol/L — ABNORMAL HIGH (ref 135–145)
Sodium: 152 mmol/L — ABNORMAL HIGH (ref 135–145)
Sodium: 153 mmol/L — ABNORMAL HIGH (ref 135–145)
TCO2: 26 mmol/L (ref 22–32)
TCO2: 25 mmol/L (ref 22–32)
TCO2: 20 mmol/L — ABNORMAL LOW (ref 22–32)
pCO2 arterial: 42.4 mmHg (ref 32.0–48.0)
pCO2 arterial: 45.5 mmHg (ref 32.0–48.0)
pCO2 arterial: 31.7 mmHg — ABNORMAL LOW (ref 32.0–48.0)
pH, Arterial: 7.37 (ref 7.350–7.450)
pH, Arterial: 7.342 — ABNORMAL LOW (ref 7.350–7.450)
pH, Arterial: 7.382 (ref 7.350–7.450)
pO2, Arterial: 65 mmHg — ABNORMAL LOW (ref 83.0–108.0)
pO2, Arterial: 228 mmHg — ABNORMAL HIGH (ref 83.0–108.0)
pO2, Arterial: 97 mmHg (ref 83.0–108.0)

## 2020-10-02 LAB — BASIC METABOLIC PANEL
Anion gap: 10 (ref 5–15)
BUN: 36 mg/dL — ABNORMAL HIGH (ref 8–23)
CO2: 23 mmol/L (ref 22–32)
Calcium: 8.9 mg/dL (ref 8.9–10.3)
Chloride: 117 mmol/L — ABNORMAL HIGH (ref 98–111)
Creatinine, Ser: 1.81 mg/dL — ABNORMAL HIGH (ref 0.61–1.24)
GFR, Estimated: 41 mL/min — ABNORMAL LOW (ref >60)
Glucose, Bld: 164 mg/dL — ABNORMAL HIGH (ref 70–99)
Potassium: 4.1 mmol/L (ref 3.5–5.1)
Sodium: 150 mmol/L — ABNORMAL HIGH (ref 135–145)

## 2020-10-02 LAB — GLUCOSE, CAPILLARY
Glucose-Capillary: 132 mg/dL — ABNORMAL HIGH (ref 70–99)
Glucose-Capillary: 158 mg/dL — ABNORMAL HIGH (ref 70–99)
Glucose-Capillary: 161 mg/dL — ABNORMAL HIGH (ref 70–99)
Glucose-Capillary: 171 mg/dL — ABNORMAL HIGH (ref 70–99)
Glucose-Capillary: 183 mg/dL — ABNORMAL HIGH (ref 70–99)
Glucose-Capillary: 214 mg/dL — ABNORMAL HIGH (ref 70–99)

## 2020-10-02 LAB — CBC
HCT: 44.9 % (ref 39.0–52.0)
Hemoglobin: 14.5 g/dL (ref 13.0–17.0)
MCH: 32.3 pg (ref 26.0–34.0)
MCHC: 32.3 g/dL (ref 30.0–36.0)
MCV: 100 fL (ref 80.0–100.0)
Platelets: 169 10*3/uL (ref 150–400)
RBC: 4.49 MIL/uL (ref 4.22–5.81)
RDW: 13.2 % (ref 11.5–15.5)
WBC: 16.3 10*3/uL — ABNORMAL HIGH (ref 4.0–10.5)
nRBC: 0 % (ref 0.0–0.2)

## 2020-10-02 LAB — SODIUM
Sodium: 152 mmol/L — ABNORMAL HIGH (ref 135–145)
Sodium: 152 mmol/L — ABNORMAL HIGH (ref 135–145)

## 2020-10-02 MED ORDER — CHLORHEXIDINE GLUCONATE 0.12% ORAL RINSE (MEDLINE KIT)
15.0000 mL | Freq: Two times a day (BID) | OROMUCOSAL | Status: DC
  Administered 2020-10-02 – 2020-10-28 (×53): 15 mL via OROMUCOSAL

## 2020-10-02 MED ORDER — PROPOFOL 1000 MG/100ML IV EMUL
0.0000 ug/kg/min | INTRAVENOUS | Status: DC
  Administered 2020-10-02: 10 ug/kg/min via INTRAVENOUS

## 2020-10-02 MED ORDER — SODIUM CHLORIDE 0.9 % IV BOLUS: 1000.0000 mL | Freq: Once | INTRAVENOUS | Status: DC

## 2020-10-02 MED ORDER — FENTANYL CITRATE (PF) 100 MCG/2ML IJ SOLN
25.0000 ug | INTRAMUSCULAR | Status: DC | PRN
  Administered 2020-10-04 – 2020-10-07 (×10): 100 ug via INTRAVENOUS
  Administered 2020-10-08 – 2020-10-09 (×2): 50 ug via INTRAVENOUS
  Administered 2020-10-09: 100 ug via INTRAVENOUS
  Administered 2020-10-09: 50 ug via INTRAVENOUS
  Administered 2020-10-09 – 2020-10-14 (×4): 100 ug via INTRAVENOUS
  Filled 2020-10-02 (×13): qty 2

## 2020-10-02 MED ORDER — FENTANYL CITRATE (PF) 100 MCG/2ML IJ SOLN
25.0000 ug | Freq: Once | INTRAMUSCULAR | Status: AC
  Administered 2020-10-02: 50 ug via INTRAVENOUS

## 2020-10-02 MED ORDER — ETOMIDATE 2 MG/ML IV SOLN
INTRAVENOUS | Status: DC | PRN
  Administered 2020-10-02: 20 mg via INTRAVENOUS

## 2020-10-02 MED ORDER — FUROSEMIDE 10 MG/ML IJ SOLN
40.0000 mg | Freq: Once | INTRAMUSCULAR | Status: AC
  Administered 2020-10-02: 40 mg via INTRAVENOUS
  Filled 2020-10-02: qty 4

## 2020-10-02 MED ORDER — DEXMEDETOMIDINE HCL IN NACL 400 MCG/100ML IV SOLN
0.4000 ug/kg/h | INTRAVENOUS | Status: DC
  Administered 2020-10-02: 0.7 ug/kg/h via INTRAVENOUS
  Administered 2020-10-02: 0.4 ug/kg/h via INTRAVENOUS
  Administered 2020-10-03: 0.8 ug/kg/h via INTRAVENOUS
  Administered 2020-10-03: 0.3 ug/kg/h via INTRAVENOUS
  Administered 2020-10-07: 0.5 ug/kg/h via INTRAVENOUS
  Administered 2020-10-07: 0.8 ug/kg/h via INTRAVENOUS
  Administered 2020-10-07: 0.3 ug/kg/h via INTRAVENOUS
  Administered 2020-10-08: 0.6 ug/kg/h via INTRAVENOUS
  Administered 2020-10-08: 0.8 ug/kg/h via INTRAVENOUS
  Administered 2020-10-08: 0.4 ug/kg/h via INTRAVENOUS
  Administered 2020-10-09: 0.6 ug/kg/h via INTRAVENOUS
  Administered 2020-10-09: 0.502 ug/kg/h via INTRAVENOUS
  Administered 2020-10-09 – 2020-10-10 (×2): 0.4 ug/kg/h via INTRAVENOUS
  Filled 2020-10-02 (×16): qty 100

## 2020-10-02 MED ORDER — ROCURONIUM BROMIDE 50 MG/5ML IV SOLN
100.0000 mg | Freq: Once | INTRAVENOUS | Status: AC
  Administered 2020-10-02: 100 mg via INTRAVENOUS
  Filled 2020-10-02: qty 10

## 2020-10-02 MED ORDER — DIGOXIN 0.05 MG/ML PO SOLN
0.1250 mg | Freq: Every day | ORAL | Status: DC
  Administered 2020-10-02 – 2020-10-03 (×2): 0.125 mg
  Filled 2020-10-02 (×3): qty 2.5

## 2020-10-02 MED ORDER — SODIUM CHLORIDE 0.9 % IV BOLUS
2000.0000 mL | Freq: Once | INTRAVENOUS | Status: AC
  Administered 2020-10-02: 1000 mL via INTRAVENOUS

## 2020-10-02 MED ORDER — SODIUM CHLORIDE 0.9 % IV BOLUS
1000.0000 mL | Freq: Once | INTRAVENOUS | Status: AC
  Administered 2020-10-02: 1000 mL via INTRAVENOUS

## 2020-10-02 MED ORDER — DIGOXIN 0.25 MG/ML IJ SOLN
0.5000 mg | Freq: Once | INTRAMUSCULAR | Status: AC
  Administered 2020-10-02: 0.5 mg via INTRAVENOUS
  Filled 2020-10-02: qty 2

## 2020-10-02 MED ORDER — ORAL CARE MOUTH RINSE
15.0000 mL | OROMUCOSAL | Status: DC
  Administered 2020-10-02 – 2020-10-28 (×257): 15 mL via OROMUCOSAL

## 2020-10-02 MED ORDER — HEPARIN SODIUM (PORCINE) 5000 UNIT/ML IJ SOLN
5000.0000 [IU] | Freq: Three times a day (TID) | INTRAMUSCULAR | Status: DC
  Administered 2020-10-02 – 2020-10-05 (×10): 5000 [IU] via SUBCUTANEOUS
  Filled 2020-10-02 (×11): qty 1

## 2020-10-02 MED ORDER — SODIUM CHLORIDE 0.9 % IV SOLN
3.0000 g | Freq: Four times a day (QID) | INTRAVENOUS | Status: DC
  Administered 2020-10-02 – 2020-10-06 (×16): 3 g via INTRAVENOUS
  Filled 2020-10-02 (×2): qty 8
  Filled 2020-10-02 (×13): qty 3
  Filled 2020-10-02 (×2): qty 8
  Filled 2020-10-02 (×2): qty 3

## 2020-10-02 MED ORDER — SUCCINYLCHOLINE CHLORIDE 20 MG/ML IJ SOLN
INTRAMUSCULAR | Status: DC | PRN
  Administered 2020-10-02: 120 mg via INTRAVENOUS

## 2020-10-02 MED ORDER — FENTANYL CITRATE (PF) 100 MCG/2ML IJ SOLN: 25.0000 ug | INTRAMUSCULAR | Status: DC | PRN

## 2020-10-02 MED ORDER — DOCUSATE SODIUM 50 MG/5ML PO LIQD
100.0000 mg | Freq: Two times a day (BID) | ORAL | Status: DC
  Filled 2020-10-02 (×2): qty 10

## 2020-10-02 MED ORDER — POLYETHYLENE GLYCOL 3350 17 G PO PACK: 17.0000 g | PACK | Freq: Every day | ORAL | Status: DC

## 2020-10-02 MED ORDER — ETOMIDATE 2 MG/ML IV SOLN
20.0000 mg | Freq: Once | INTRAVENOUS | Status: AC
  Administered 2020-10-02: 20 mg via INTRAVENOUS

## 2020-10-02 MED ORDER — SODIUM CHLORIDE 0.9 % IV BOLUS
500.0000 mL | Freq: Once | INTRAVENOUS | Status: AC
  Administered 2020-10-02: 500 mL via INTRAVENOUS

## 2020-10-02 NOTE — Progress Notes (Addendum)
eLink Physician-Brief Progress Note Patient Name: Walter [name] DOB: [birth year] MRN: [id number]   Date of Service  10/02/2020  HPI/Events of Note  Called to evaluate patient for respiratory distress. When I examined the patient via camera, they were in overt respiratory distress and exhibiting ineffective respirations. Already on NRB.   eICU Interventions  Anesthesia was paged STAT for endotracheal intubation.  Will obtain STAT NCHCT to evaluate for ? interval hemorrhage or other change that led to this decline in his clinical condition.     Intervention Category Major Interventions: Airway management;Respiratory failure - evaluation and management  Janae Bridgeman 10/02/2020, 1:37 AM

## 2020-10-02 NOTE — Progress Notes (Signed)
Patient w/ large stool incontinence, during peri-care and linen change pt became very agitated (RASS+3), propofol titrated to 4mcg/kg/min per titration orders, medication titrated back down to 75mcg/kg/min.

## 2020-10-02 NOTE — Progress Notes (Signed)
Patient ID: Walter [name], male   DOB: [birth year], 65 y.o.   MRN: [id number] Respiratory deterioration noted last night.  Issues with the drain not draining over the weekend noted.  Last CT scan shows some hemorrhage around the catheter appears to be new but the ventricles appear well decompressed.  His IVC is draining well.  He has been out of bed and 80 cc in the last 24 hours.  For the time being I believe that we should continue to observe his clinical status and leave his drain in place.

## 2020-10-02 NOTE — Progress Notes (Signed)
NAME:  Walter [name], MRN:  [id number], DOB:  12-Aug-1955, LOS: 7 ADMISSION DATE:  09/25/2020, CONSULTATION DATE:  09/25/20 REFERRING MD:  Wilford Corner,  CHIEF COMPLAINT:  Headache, r side weakness, slurred speech  History of Present Illness:  Walter [name] is a 65 yo man with hx of afib not on anticoagulation admitted with right-sided weakness, hemorrhagic stroke. Emergently intubated in the ED for airway protection  Pertinent  Medical History  Aflutter/afib (not on ac) S/p Ablation 03/2020, hx of Cardioversion 2015 Hx retinal detachment Covid infection in July 2021 treated with monoclonal antibody Hx CV   Significant Hospital Events: Including procedures, antibiotic start and stop dates in addition to other pertinent events   . 3/28- Admit, intubated.  Not a candidate for TPA due to intracranial hemorrhage.  Started 3% saline . 3/29- EVD placement for developing hydrocephalus. CTA of the head was unremarkable . 3/30- Extubated. . 4/1 respiratory culture grew Moraxella, on ceftriaxone . 4/4 reintubated because of hypoxia and unresponsiveness  Interim History / Subjective:  Remain lethargic, last night he became unresponsive and hypoxic Emergently he was intubated and placed on mechanical ventilation EVD is draining well, repeat head CT showed improvement in hydrocephalus and IVH  Objective   Blood pressure (!) 100/59, pulse 94, temperature (!) 100.4 F (38 C), temperature source Oral, resp. rate (!) 23, height 6\' 2"  (1.88 m), weight 93.2 kg, SpO2 100 %.    Vent Mode: PRVC FiO2 (%):  [60 %-100 %] 60 % Set Rate:  [20 bmp] 20 bmp Vt Set:  [650 mL] 650 mL PEEP:  [5 cmH20] 5 cmH20 Plateau Pressure:  [17 cmH20] 17 cmH20   Intake/Output Summary (Last 24 hours) at 10/02/2020 0819 Last data filed at 10/02/2020 0800 Gross per 24 hour  Intake 1968.05 ml  Output 1582 ml  Net 386.05 ml   Filed Weights   09/30/20 0400 10/01/20 0400 10/02/20 0400  Weight: 102.6 kg 102.1 kg 93.2 kg     Examination: Gen:      Elderly Caucasian male, lying in the bed, orally intubated HEENT:  EOMI, sclera anicteric, EVD in place, not draining well. Severely dry mucous membranes, ETT/EVD in place Neck:     No masses; no thyromegaly Lungs:    Coarse breath sounds at bases right more than left; normal respiratory effort CV:         Regular rate and rhythm; no murmurs Abd:      + bowel sounds; soft, non-tender; no palpable masses, no distension Ext:    No edema; adequate peripheral perfusion Skin:      Warm and dry; no rash Neuro:   Eyes closed, does not open with painful stimuli, positive threat reflex, positive cough gag and corneal reflexes.  Left side is antigravity   Labs/imaging that I havepersonally reviewed  (right click and "Reselect all SmartList Selections" daily)  Sodium 146 Tracheal aspirate is growing Moraxella  CT head 4/1: New intraventricular hemorrhage in the third and lateral ventricles. Fourth ventricular and midbrain hemorrhage is unchanged. 2. New right frontal EVD which traverses the right lateral ventricle. Blood clot encompasses the lower catheter and ventriculomegaly is similar to prior. 3. Small cortical infarcts are newly seen along the superior left frontal convexity  CT head 4/3: 1. Stable right superior frontal approach EVD with decreased volume of intraventricular hemorrhage and improved ventricular decompression from 2 days ago.  2. Small infarcts have developed in both the left superior frontal gyrus and the left cerebellum PICA territory since the  MRI 09/25/2020, not significantly changed from 09/29/2020.  3. Left dorsal midbrain intra-axial hemorrhage has not significantly changed since presentation. Associated brainstem edema.  4. Stable trace subarachnoid hemorrhage is stable and may be related to EVD placement.  CT head 4/4: 1. Interval decrease in intraventricular hemorrhage with decreased size of the lateral ventricles as compared  to prior, right greater than left. Right parietal approach ventriculostomy in stable position with tip near the septum pellucidum. 2. Stable small volume subarachnoid and intraparenchymal hemorrhage along the catheter tract. 3. Stable 1.9 cm hemorrhage at the left dorsal pons. 4. No other new acute intracranial abnormality  Resolved Hospital Problem list     Assessment & Plan:  Acute midbrain/dorsal pons/thalamic intraparenchymal hemorrhage with IVH, ICH score of 2 Acute obstructive hydrocephalus s/p EVD EVD started working after TPA instillation second time Repeat head CT showed improvement in hydrocephalus Still on 3% saline, serum sodium goal 155-165, currently serum sodium is 51 stroke team is following Continue secondary stroke prophylaxis  Acute encephalopathy due to ICH Avoid sedation Closely monitor mental status  Acute respiratory failure due to stroke Patient became hypoxic, tachypneic and unresponsive last night requiring reintubation Continue lung protective ventilation Repeat ABGs Continue to monitor mental status, able to protect his airway with a strong cough Because of his mental status, he he may not protect his airway, this is a second intubation, patient may require tracheostomy, will discuss with family  Community-acquired pneumonia with Moraxella and possible aspiration during intubation event Sputum culture grew Moraxella X-ray chest is suggestive of right lower lobe infiltrate Patient is spiked fever of 100.9, white count trended up from 10-16 Change IV ceftriaxone to Unasyn  Paroxysmal atrial fibrillation Patient went into A. fib with RVR last night, he received 1 dose of IV digoxin Currently he is in sinus rhythm with a stable heart rate Not on anticoagulation as an outpatient Continue home diltiazem 30mg  q6h  Acute kidney injury due to severe dehydration Patient looks severely dehydrated, his serum creatinine rose from 0.7-1.8 We will give him 2  L of normal saline Monitor intake and output Repeat BMP in the morning  Best Practice (right click and "Reselect all SmartList Selections" daily)   Diet:  Tube Feed  Pain/Anxiety/Delirium protocol (if indicated): RASS goal 0 VAP protocol (if indicated): Yes DVT prophylaxis: SCD GI prophylaxis: PPI Glucose control:  SSI Yes Central venous access:  N/A Arterial line:  N/A Foley:  N/A Mobility:  bed rest  PT consulted: Yes Code Status:  full code Prognosis: Guarded Last date of multidisciplinary goals of care discussion [Per primary team]   Total critical care time: 47 minutes  Performed by:   Critical care time was exclusive of separately billable procedures and treating other patients.   Critical care was necessary to treat or prevent imminent or life-threatening deterioration.   Critical care was time spent personally by me on the following activities: development of treatment plan with patient and/or surrogate as well as nursing, discussions with consultants, evaluation of patient's response to treatment, examination of patient, obtaining history from patient or surrogate, ordering and performing treatments and interventions, ordering and review of laboratory studies, ordering and review of radiographic studies, pulse oximetry and re-evaluation of patient's condition.   Cheri Fowler MD [facility] See Amion for pager If no response to pager, please call [phone] until 7pm After 7pm, Please call E-link [phone]

## 2020-10-02 NOTE — Procedures (Signed)
Patient Name: Walter [name]  MRN: [id number]  Epilepsy Attending: [name]  Referring Physician/Provider: Dr. Marvel Plan Date: 10/02/2020 Duration: 32.31 mins  Patient history: 65 year old male with intraparenchymal and intraventricular hemorrhage status post right parietal ventriculostomy.  EEG to evaluate for seizures.  Level of alertness: Comatose  AEDs during EEG study: None  Technical aspects: This EEG study was done with scalp electrodes positioned according to the 10-20 International system of electrode placement. Electrical activity was acquired at a sampling rate of 500Hz  and reviewed with a high frequency filter of 70Hz  and a low frequency filter of 1Hz . EEG data were recorded continuously and digitally stored.   Description: EEG showed continuous generalized and maximal right temporal region polymorphic sharply contoured 3 to 6 Hz theta-delta slowing.  Hyperventilation and photic stimulation were not performed.     ABNORMALITY -Continuous slow, generalized and maximal right temporal region.  IMPRESSION: This study is suggestive of cortical dysfunction in right temporal region likely secondary to underlying structural abnormality as well as moderate to severe diffuse encephalopathy, nonspecific etiology.  No seizures or epileptiform discharges were seen throughout the recording.  Tatiyana Foucher 

## 2020-10-02 NOTE — Transfer of Care (Signed)
Immediate Anesthesia Transfer of Care Note  Patient: DAMONT BALLES  Procedure(s) Performed: AN AD HOC INTUBATION  Patient Location: ICU  Anesthesia Type:General  Level of Consciousness: Patient remains intubated per anesthesia plan  Airway & Oxygen Therapy: Patient remains intubated per anesthesia plan and Patient placed on Ventilator (see vital sign flow sheet for setting)  Post-op Assessment: Report given to RN and Post -op Vital signs reviewed and stable  Post vital signs: Reviewed and stable  Last Vitals:  Vitals Value Taken Time  BP 124/66 10/02/20 0154  Temp    Pulse 57 10/02/20 0157  Resp 20 10/02/20 0157  SpO2 97 % 10/02/20 0157  Vitals shown include unvalidated device data.  Last Pain:  Vitals:   10/02/20 0000  TempSrc: Axillary  PainSc:          Complications: No complications documented.

## 2020-10-02 NOTE — Anesthesia Procedure Notes (Signed)
Procedure Name: Intubation Date/Time: 10/02/2020 1:56 AM Performed by: Claudina Lick, CRNA Pre-anesthesia Checklist: Patient identified, Emergency Drugs available, Suction available, Patient being monitored and Timeout performed Patient Re-evaluated:Patient Re-evaluated prior to induction Oxygen Delivery Method: Ambu bag Preoxygenation: Pre-oxygenation with 100% oxygen Induction Type: IV induction, Rapid sequence and Cricoid Pressure applied Laryngoscope Size: Glidescope and 4 Grade View: Grade I Tube type: Oral Tube size: 8.0 mm Number of attempts: 1 Airway Equipment and Method: Stylet and Video-laryngoscopy Placement Confirmation: ETT inserted through vocal cords under direct vision,  positive ETCO2 and breath sounds checked- equal and bilateral Secured at: 23 cm Tube secured with: Tape Dental Injury: Teeth and Oropharynx as per pre-operative assessment

## 2020-10-02 NOTE — Progress Notes (Addendum)
STROKE TEAM PROGRESS NOTE   INTERVAL HISTORY  Patient required intubation overnight due to persistent somnolence, inability to protect airway, and hypoxia.  Patient was agitated this morning and was placed on sedation.  Exam difficult to obtain due to sedation.  He opened his eyes to voice.  Unable to keep them open. Pupils reactive but sluggish, + corneals, cough and gag, RU spontaneous 2/5, LUEweak withdraw, RLE spontaneous 1/5, LLE weak withdraw.  CT head this am showed decrease IVH with decreased size of the lateral ventricles R>L.  EVD is open at 10 mmHg).  ICP range 2-12.  Adequate drainage (5-12cc/hr).   Vitals:   10/02/20 1100 10/02/20 1137 10/02/20 1200 10/02/20 1300  BP: (!) 104/59  111/60 (!) 101/58  Pulse: 84 81 80 78  Resp: (!) 26 (!) 21 (!) 23 (!) 24  Temp:   99 F (37.2 C)   TempSrc:   Axillary   SpO2: 100% 100% 100% 100%  Weight:      Height:       CBC:  Recent Labs  Lab 10/01/20 0535 10/01/20 2054 10/02/20 0534 10/02/20 0859  WBC 10.7*  --  16.3*  --   HGB 15.4   < > 14.5 13.6  HCT 45.8   < > 44.9 40.0  MCV 93.7  --  100.0  --   PLT 172  --  169  --    < > = values in this interval not displayed.   Basic Metabolic Panel:  Recent Labs  Lab 09/27/20 1602 09/27/20 2245 09/28/20 0512 09/28/20 1106 10/01/20 0535 10/01/20 1048 10/02/20 0534 10/02/20 0859 10/02/20 1059  NA 155*   < > 151*   < > 145   < > 150* 153* 152*  K  --   --   --    < > 3.8   < > 4.1 3.7  --   CL  --   --   --    < > 111  --  117*  --   --   CO2  --   --   --    < > 24  --  23  --   --   GLUCOSE  --   --   --    < > 151*  --  164*  --   --   BUN  --   --   --    < > 14  --  36*  --   --   CREATININE  --   --   --    < > 0.74  --  1.81*  --   --   CALCIUM  --   --   --    < > 9.2  --  8.9  --   --   MG 2.1  --  2.1  --   --   --   --   --   --   PHOS 2.5  --  3.3  --   --   --   --   --   --    < > = values in this interval not displayed.   Lipid Panel:  Recent Labs  Lab  09/25/20 1659  CHOL 155  TRIG 117  HDL 44  CHOLHDL 3.5  VLDL 23  LDLCALC 88   HgbA1c:  No results for input(s): HGBA1C in the last 168 hours. Urine Drug Screen:  No results for input(s): LABOPIA, COCAINSCRNUR, LABBENZ, AMPHETMU, THCU, LABBARB in  the last 168 hours.  Alcohol Level  No results for input(s): ETH in the last 168 hours.  IMAGING  10/02/2020 CT head IMPRESSION: 1. Interval decrease in intraventricular hemorrhage with decreased size of the lateral ventricles as compared to prior, right greater than left. Right parietal approach ventriculostomy in stable position with tip near the septum pellucidum. 2. Stable small volume subarachnoid and intraparenchymal hemorrhage along the catheter tract. 3. Stable 1.9 cm hemorrhage at the left dorsal pons. 4. No other new acute intracranial abnormality. 09/29/2020  CT head IMPRESSION: 1. New intraventricular hemorrhage in the third and lateral ventricles. Fourth ventricular and midbrain hemorrhage is unchanged. 2. New right frontal EVD which traverses the right lateral ventricle. Blood clot encompasses the lower catheter and ventriculomegaly is similar to prior. 3. Small cortical infarcts are newly seen along the superior left frontal convexity.  09/26/2020 IR Angio IMPRESSION: 1. No evidence of an AVM, dural arteriovenous fistula, aneurysm or other vascular abnormality to explain patient's brainstem hemorrhage.  09/26/2020 CT head:  IMPRESSION: 1. Unchanged left brainstem hemorrhage extending into the fourth ventricle. 2. Worsening hydrocephalus.  09/25/2020 MRI brain w/wo, MR Venogram IMPRESSION: 1. Acute hemorrhage centered within the left dorsal midbrain extending into the dorsal pons and inferomedial thalamus with intraventricular extension. No abnormal enhancement to suggest underlying lesion. 2. Compression of the cerebral aqueduct with possible mild obstructive hydrocephalus. 3. No evidence of dural sinus  thrombosis. Attenuation of the left basal vein in the quadrigeminal cistern without definite occlusion.  09/25/2020 CT Angio head/neck IMPRESSION: 1. No vascular malformation, anomaly or aneurysm. 2. No emergent large vessel occlusion or high-grade stenosis of the intracranial arteries. 3. Unchanged size of dorsal left pontine hemorrhage with intraventricular extension.  09/25/2020 CT head code stroke IMPRESSION: Acute intraparenchymal hemorrhage in the dorsal left pons with subarachnoid extension into the cerebral aqueduct and fourth Ventricle.   PHYSICAL EXAM Blood pressure (!) 101/58, pulse 78, temperature 99 F (37.2 C), temperature source Axillary, resp. rate (!) 24, height 6\' 2"  (1.88 m), weight 93.2 kg, SpO2 100 %.  General: drowsy but arousable to loud voice, elderly caucasian male, no apparent distress. Intubated Right frontal ventriculostomy catheter in place Lungs: Symmetrical Chest rise, no labored breathing Cardio: Irregular Abdomen: Soft, non-tender  Neurological Exam : drowsy but  arousable to voice, Speech slowed and soft but appears to not have aphasia.  Unable to follow commands today   Cranial Nerves: II:  Unable to assess visual fields, inconsistently blinking to visual threat, pupils equal, round, reactive to light  III,IV, VI: ptosis not present, eyes midline with forced eye opening, not tracking V,VII: face appears symmetric, unable to assess facial sensation due to sedation VIII: hearing normal bilaterally IX,X: unable to assess due to sedation XI: unable to assess shoulder shrug due to sedation XII: does not stick out tongue due to sedation  Motor: Right upper extremity  spontaneous 2/5  Left upper extremity weak withdraw Right lower extremity: spontaneous foot movement 1/2 Left lower extremity:  Weak withdraw    Tone and bulk:normal tone throughout; no atrophy noted    ASSESSMENT/PLAN Walter [name] is a 65 y.o. male with history of  atrial flutter newly diagnosed with atrial fibrillation in February 2022 not on anticoagulation, follows with Dr. March 2022 in cardiology.  He presented with right-sided weakness, slurred speech, headache, and vomiting.Last known well when he went to bed at 10 PM on 09/24/2020 and woke up at around 2 AM on the day of admission with slurred speech.  EMS was called.He was very drowsy upon their assessment and notable right side weakeness.  He had worsening mental status, GCS 5, so was emergently intubated in ED for airway protection.  His MRI brain showed acute hemorrhage at left midbrain extending into pons with IVH associated with obstructive hydrocephalus.    ICH - Left Dorsalmidbrain ICHextending into dorsal pons and inferior thalamus with mild mass-effect on third ventricle and hydrocephalus,source unknown, suspect occult cavernoma  Code Stroke CT head-Acute IPH in the dorsal left pons with subarachnoid extension into the cerebral aqueduct and fourth ventricle.  CTA head & neck-No vascular malformation, anomaly or aneurysm. No emergent large vessel occlusion or high-grade stenosis. Unchanged size of dorsal left pontine hemorrhage with intraventricular extension.  MRI& MR Venogram-Acute hemorrhage centered within the left dorsal midbrain extending into the dorsal pons and inferomedial thalamus with intraventricular extension. Compression of the cerebral aqueduct with possible mild obstructive hydrocephalus. No evidence of dural sinus thrombosis.   CT Head W/O Contrast 3/29 0250 -Unchanged left brainstem hemorrhage extending into the fourth ventricle. Worsening hydrocephalus.  Diagnostic Cerebral Angiogram - No evidence of an aneurysm, AVM, dural AV fistula or other vascular abnormality to explain patient's known intraparenchymal hemorrhage.  2D Echo- EF:55-60%. No wall motion abnormalities.  LDL- 88  HgbA1c5.7  VTE prophylaxis -subcu heparin  No antithromboticprior to  admission, now on Aspirin 81 mg. Follow up in clinic regarding further use of antiplatelet vs. anticoagulation.  Therapy recommendations:Pending  Disposition:Pending  Obstructive hydrocephalus   EVD placed on 3/30   CT 4/1 showed clot in right lateral ventricle around catheter.   S/p 1mg  (1cc) alteplase placed in EVD 09/29/20 -> drain well ->ICP measuring 4-11 with a good waveform  CT head (4/3) resolution of most of the IVH and improved hydrocephalus.  EVD stopped working 4/2 overnight. 0.5mg  alteplase placed in EVD 10/01/2020 -> Post alteplase CT head new hemorrhage along the EVD tract an in the right lateral ventricle.  CT repeat 4/4 - decreased IVH and decompressed right lateral ventricle  Cerebral Edema  PICC line placement 4/3 for hypertonic saline solution  3% 94ml/hr   Na goal is 150-155  Sodium level: 147->148->146->148->145->152  Follow sodium q6hr  Acute Respiratory Failure  Required intubation on 4/4 due to worsening MS and hypoxia  Full vent support  CCM managing  [facility]-acquired pneumonia with Moraxella  Sputum culture (3/30): Moraxella  Discontinue Ceftriaxone 2g q 24hrs and add Unasyn 3gq6hr  WBC 10.7->16.3  CCM following  AKI   Creatinine 0.74 -> 1.81  On IV fluid and tube feeding  CCM on board  Close monitoring  Hypertension  Home meds:Diltiazem  Currently on Diltiazem 30 mg q6  BP goal Systolic < 160  Long-term BP goal normotensive  Aflutter and Afib  Not on [facility] before admission  Currently on ASA  Not AC candidate at this time.  Follow up in clinic regarding further use of antiplatelet vs. anticoagulation.  Hyperlipidemia  Home meds:none  LDL88, goal < 70  Addstatin once hemorrhage is stable  Continue statin at discharge  Dysphagia  Secondary to hemorrhagic stroke  NPO  SLP on board  Tube feeds @ 46ml/hr   Other Stroke Risk Factors  Advanced Age >/= 63  Other  Active Problems:  Hypokalemia, resolved   ATTENDING NOTE: I reviewed above note and agree with the assessment and plan. Pt was seen and examined.   Patient wife and daughter at bedside.  Patient developed hypoxia and snorous breathing yesterday afternoon, was intubated  for airway protection.  Currently patient on propofol sedation, not responsive, eyes closed, not open on voice or pain stimulation.  Not follow commands.  Bilateral eyes mid position, positive doll's eyes, PERRL, weak corneals, but positive gag and cough.  No moving extremities spontaneous, on pain stimulation, currently slight ankle DF bilaterally.  Repeat CT this morning showed clearance of IVH at third ventricle and upper lateral ventricles.  Decompressed right lateral ventricle.  EVD drainage around 25 cc/h.  However, patient developed AKI and leukocytosis.  On Unasyn, tube feeding and IV fluid.  Continue ventilation support.  Check BMP in a.m.  For detailed assessment and plan, please refer to above as I have made changes wherever appropriate.   Marvel Plan, MD PhD Stroke Neurology 10/02/2020 3:32 PM  This patient is critically ill due to pontine hemorrhage, obstructive hydrocephalus, respiratory failure with intubation, A. fib with a flutter, cerebral edema and at significant risk of neurological worsening, death form hematoma expansion, worsening obstructive hydrocephalus, heart failure, kidney failure, sepsis. This patient's care requires constant monitoring of vital signs, hemodynamics, respiratory and cardiac monitoring, review of multiple databases, neurological assessment, discussion with family, other specialists and medical decision making of high complexity. I spent 45 minutes of neurocritical care time in the care of this patient. I had long discussion with wife and daughter at bedside, updated pt current condition, treatment plan and potential prognosis, and answered all the questions.  They expressed understanding and  appreciation.  I also discussed with Dr. Merrily Pew CCM.    To contact Stroke Continuity provider, please refer to [email or web address]. After hours, contact General Neurology

## 2020-10-02 NOTE — Progress Notes (Signed)
Nutrition Follow-up  DOCUMENTATION CODES:   Not applicable  INTERVENTION:    Tube feeding via Cortrak tube: Vital 1.5 at 65 ml/h (1560 ml per day) Prosource TF 45 ml BID  Provides 2420 kcal, 127 gm protein, 1185 ml free water daily   NUTRITION DIAGNOSIS:   Inadequate oral intake related to inability to eat as evidenced by NPO status.Ongoing.   GOAL:   Patient will meet greater than or equal to 90% of their needs Met with TF.   MONITOR:   TF tolerance  REASON FOR ASSESSMENT:   Consult,Ventilator Enteral/tube feeding initiation and management  ASSESSMENT:   Pt with PMH of afib not on anticoagulation s/p ablation 9/21, s/p cardioversion 2015, retinal detachment s/p surgery, COVID s/p monoclonal antibody treatment 7/21, and CV admitted with right-sided weakness and ICH.     Per CT pt with intraparenchymal hemorrhage with local mass-effect on brainstem developing hydrocephalus.  Pt became unresponsive and hypoxic requiring emergent re-intubation. Per CCM may need trach placement. Per head CT improvement in hydrocephalus and IVH. Remains on 3% with goal of 155-165.    3/29 EVD placement  3/30 extubated; cortrak placed tip gastric  4/4 re-intubated  Patient is currently intubated on ventilator support MV: 17.2 L/min Temp (24hrs), Avg:99.7 F (37.6 C), Min:98.1 F (36.7 C), Max:100.9 F (38.3 C)    Medications reviewed and include: colace, SSI, protonix, senokot-s  Precedex  3% hypertonic saline  Labs reviewed: Na 153 - hypertonic saline stopped  CBG's: 183-214  EVD: 281 ml    Diet Order:   Diet Order            Diet NPO time specified  Diet effective now                 EDUCATION NEEDS:   No education needs have been identified at this time  Skin:  Skin Assessment: Reviewed RN Assessment  Last BM:  rectal pouch placed 4/3  Height:   Ht Readings from Last 1 Encounters:  09/25/20 _0  (1.88 m)    Weight:   Wt Readings from Last 1  Encounters:  10/02/20 93.2 kg    Ideal Body Weight:  86.3 kg  BMI:  Body mass index is 26.38 kg/m.  Estimated Nutritional Needs:   Kcal:  2400-2600  Protein:  115-130 grams  Fluid:  >2 L/day  Lockie Pares., RD, LDN, CNSC See AMiON for contact information

## 2020-10-02 NOTE — Progress Notes (Signed)
Patient obtunded and began to drop O2 sats in the 70s while on 3L nasal canula. Placed patient on 15 L non rebreather and notified Dr. Benjamin Stain of patient's current status. Patient intubated by anesthesia team and Dr. Denese Killings came to assess patient. CCM placed order for stat head ct. RN Notified patients wife that pt is now intubated.Will continue to monitor pt.

## 2020-10-02 NOTE — Evaluation (Signed)
Physical Therapy Re-Evaluation Patient Details Name: Walter [name] MRN: [id number] DOB: Jun 26, 1956 Today's Date: 10/02/2020   History of Present Illness  65 yo male presenting 3/28 with R-sided weakness and slurred speech. Imaging revealed acute hemorrhage centered within the left dorsal midbrain extending into the dorsal pons and inferomedial thalamus with intraventricular extension. EVD placement for developing hydrocephalus on 3/29. Extubated 3/30. Pt with noted decrease in EVD drainage overnight 3/31, proximal and distal clots noted, flushed, repeat CT shows significant clot in R lateral ventricle. EVD with another episode of clotting overnight 4/2, intraventricular TpA administered. Reintubated 4/4. PMH Afib, COVID infection (July 2021), and retinal detachment.    Clinical Impression  The pt was seen for re-evaluation this afternoon. The pt was able to respond to multiple commands regarding movement of BLE. However, the pt was found to be soiled in the bed and required totalA of 2 to complete bed mobility (rolling) to allow for cleaning at this time. He was off sedation at beginning of session, but required some sedation due to increased agitation/discomfort with bed mobility at this time. The pt presents with more significant deficits in LE strength, activity tolerance, and stability at this time. He was able to perform anti-gravity movements, but was limited in ability to complete functional use of UE for bed mobility. The pt will continue to benefit from skilled PT to progress capacity for bed mobility, transfers, and OOB mobility. Continue to recommend CIR level therapies at d/c to reduce caregiver burden and facilitate return to prior level of mobility/independence.     Follow Up Recommendations CIR    Equipment Recommendations   (defer to post acute)    Recommendations for Other Services       Precautions / Restrictions Precautions Precautions: Fall Precaution Comments: EVD (clamped  during session) Restrictions Weight Bearing Restrictions: No      Mobility  Bed Mobility Overal bed mobility: Needs Assistance Bed Mobility: Rolling Rolling: Total assist;+2 for physical assistance;+2 for safety/equipment         General bed mobility comments: totalA to complete rolling at this time to complete cleaning and pericare. pt with increased agitation and discomfort with rolling, unable to initiate movement to participate in rolling at this time.    Transfers                 General transfer comment: deferred at this time   Modified Rankin (Stroke Patients Only) Modified Rankin (Stroke Patients Only) Pre-Morbid Rankin Score: No symptoms Modified Rankin: Severe disability     Balance                                             Pertinent Vitals/Pain Pain Assessment: Faces Faces Pain Scale: Hurts even more Pain Location: generalized grimacing with bed mobility Pain Descriptors / Indicators: Grimacing Pain Intervention(s): Limited activity within patient's tolerance;Monitored during session;Repositioned        Cognition Arousal/Alertness: Lethargic Behavior During Therapy: Flat affect Overall Cognitive Status: Difficult to assess                                 General Comments: pt followed multiple commands with BLE, but fatigued after activity, and did not engage with eye opening or command following.      General Comments General comments (skin integrity, edema, etc.): pt  on vent with peep of 5, FiO2 of 40%. off sedation prior to session, RN increased sedation with pericare due to pt agitation/discomfort with rolling.        Assessment/Plan    PT Assessment    PT Problem List         PT Treatment Interventions      PT Goals (Current goals can be found in the Care Plan section)  Acute Rehab PT Goals Patient Stated Goal: get out of bed PT Goal Formulation: With patient Time For Goal Achievement:  10/16/20 Potential to Achieve Goals: Fair    Frequency Min 4X/week    AM-PAC PT "6 Clicks" Mobility  Outcome Measure Help needed turning from your back to your side while in a flat bed without using bedrails?: Total Help needed moving from lying on your back to sitting on the side of a flat bed without using bedrails?: Total Help needed moving to and from a bed to a chair (including a wheelchair)?: Total Help needed standing up from a chair using your arms (e.g., wheelchair or bedside chair)?: Total Help needed to walk in hospital room?: Total Help needed climbing 3-5 steps with a railing? : Total 6 Click Score: 6    End of Session Equipment Utilized During Treatment:  (vent) Activity Tolerance: Patient limited by fatigue;Patient limited by lethargy Patient left: in bed;with call bell/phone within reach;with bed alarm set;with SCD's reapplied Nurse Communication: Mobility status PT Visit Diagnosis: Hemiplegia and hemiparesis;Other abnormalities of gait and mobility (R26.89) Hemiplegia - Right/Left: Right Hemiplegia - dominant/non-dominant: Dominant Hemiplegia - caused by: Nontraumatic intracerebral hemorrhage    Time: 9518-8416 PT Time Calculation (min) (ACUTE ONLY): 27 min   Charges:   PT Evaluation $PT Re-evaluation: 1 Re-eval PT Treatments $Therapeutic Activity: 8-22 mins        Rolm Baptise, PT, DPT   Acute Rehabilitation Department Pager #: [phone]  Gaetana Michaelis 10/02/2020, 1:23 PM

## 2020-10-02 NOTE — Progress Notes (Signed)
SLP Cancellation Note  Patient Details Name: SHADOE BETHEL MRN: [id number] DOB: 29-Sep-1955   Cancelled treatment:       Reason Eval/Treat Not Completed: Medical issues which prohibited therapy. Pt intubated overnight. Will f/u for readiness.     Mahala Menghini., M.A. CCC-SLP Acute Rehabilitation Services Pager [phone] Office [phone]  10/02/2020, 7:23 AM

## 2020-10-02 NOTE — Progress Notes (Addendum)
Critical Care On Call  Called to bedside for patient unable to protect airway. Progressively more obtunded over the course of the evening, followed by desaturation.   On my arrival, anesthesiology present and intubating patient.   On examination: patient unresponsive to painful stimulation, although began to cough towards end of exam. Pupils are equal. ETT initially at the level of the cords - advanced 3cm. Equal breath sounds. Tachycardic in rapid atrial fibrillation. Suctioning copious tan secretions.   CXR reviewed: ETT in adequate position, lung fields clear.   A/P:  Full ventilation - ABG post intubation.  Given fentanyl and started on propofol in response to pain induced tachycardia. Given fluid bolus 0.9% saline and loaded with digoxin.  Drain left open at 10cmCSF, repeat CT head to ensure no worsening hydrocephalus.  Continue pulmonary toilette.   CRITICAL CARE Performed by: Lynnell Catalan   Total critical care time: 30 minutes  Critical care time was exclusive of separately billable procedures and treating other patients.  Critical care was necessary to treat or prevent imminent or life-threatening deterioration.  Critical care was time spent personally by me on the following activities: development of treatment plan with patient and/or surrogate as well as nursing, discussions with consultants, evaluation of patient's response to treatment, examination of patient, obtaining history from patient or surrogate, ordering and performing treatments and interventions, ordering and review of laboratory studies, ordering and review of radiographic studies, pulse oximetry, re-evaluation of patient's condition and participation in multidisciplinary rounds.  Lynnell Catalan, MD [facility] ICU Physician [facility] [place] Critical [facility]  Pager: [phone] Mobile: [phone] After hours: [phone].  Addendum:  CT head reviewed and unchanged from previous scan.

## 2020-10-02 NOTE — Progress Notes (Signed)
EEG complete - results pending 

## 2020-10-03 ENCOUNTER — Inpatient Hospital Stay (HOSPITAL_COMMUNITY): Payer: Medicare Other

## 2020-10-03 DIAGNOSIS — D62 Acute posthemorrhagic anemia: Secondary | ICD-10-CM | POA: Diagnosis not present

## 2020-10-03 DIAGNOSIS — I480 Paroxysmal atrial fibrillation: Secondary | ICD-10-CM | POA: Diagnosis not present

## 2020-10-03 DIAGNOSIS — Z01818 Encounter for other preprocedural examination: Secondary | ICD-10-CM

## 2020-10-03 DIAGNOSIS — G9340 Encephalopathy, unspecified: Secondary | ICD-10-CM | POA: Diagnosis not present

## 2020-10-03 DIAGNOSIS — G40109 Localization-related (focal) (partial) symptomatic epilepsy and epileptic syndromes with simple partial seizures, not intractable, without status epilepticus: Secondary | ICD-10-CM

## 2020-10-03 DIAGNOSIS — I613 Nontraumatic intracerebral hemorrhage in brain stem: Secondary | ICD-10-CM | POA: Diagnosis not present

## 2020-10-03 DIAGNOSIS — R509 Fever, unspecified: Secondary | ICD-10-CM

## 2020-10-03 DIAGNOSIS — J9602 Acute respiratory failure with hypercapnia: Secondary | ICD-10-CM | POA: Diagnosis not present

## 2020-10-03 DIAGNOSIS — I629 Nontraumatic intracranial hemorrhage, unspecified: Secondary | ICD-10-CM | POA: Diagnosis not present

## 2020-10-03 LAB — BASIC METABOLIC PANEL
Anion gap: 4 — ABNORMAL LOW (ref 5–15)
BUN: 34 mg/dL — ABNORMAL HIGH (ref 8–23)
CO2: 23 mmol/L (ref 22–32)
Calcium: 8.2 mg/dL — ABNORMAL LOW (ref 8.9–10.3)
Chloride: 128 mmol/L — ABNORMAL HIGH (ref 98–111)
Creatinine, Ser: 1.14 mg/dL (ref 0.61–1.24)
GFR, Estimated: >60 mL/min (ref >60)
Glucose, Bld: 166 mg/dL — ABNORMAL HIGH (ref 70–99)
Potassium: 4.1 mmol/L (ref 3.5–5.1)
Sodium: 155 mmol/L — ABNORMAL HIGH (ref 135–145)

## 2020-10-03 LAB — CBC
HCT: 37.9 % — ABNORMAL LOW (ref 39.0–52.0)
Hemoglobin: 12.1 g/dL — ABNORMAL LOW (ref 13.0–17.0)
MCH: 32.4 pg (ref 26.0–34.0)
MCHC: 31.9 g/dL (ref 30.0–36.0)
MCV: 101.6 fL — ABNORMAL HIGH (ref 80.0–100.0)
Platelets: 132 10*3/uL — ABNORMAL LOW (ref 150–400)
RBC: 3.73 MIL/uL — ABNORMAL LOW (ref 4.22–5.81)
RDW: 13.8 % (ref 11.5–15.5)
WBC: 11 10*3/uL — ABNORMAL HIGH (ref 4.0–10.5)
nRBC: 0 % (ref 0.0–0.2)

## 2020-10-03 LAB — CBC WITH DIFFERENTIAL/PLATELET
Abs Immature Granulocytes: 0.06 10*3/uL (ref 0.00–0.07)
Basophils Absolute: 0 10*3/uL (ref 0.0–0.1)
Basophils Relative: 0 %
Eosinophils Absolute: 0 10*3/uL (ref 0.0–0.5)
Eosinophils Relative: 0 %
HCT: 36.1 % — ABNORMAL LOW (ref 39.0–52.0)
Hemoglobin: 11.5 g/dL — ABNORMAL LOW (ref 13.0–17.0)
Immature Granulocytes: 1 %
Lymphocytes Relative: 10 %
Lymphs Abs: 1 10*3/uL (ref 0.7–4.0)
MCH: 32.2 pg (ref 26.0–34.0)
MCHC: 31.9 g/dL (ref 30.0–36.0)
MCV: 101.1 fL — ABNORMAL HIGH (ref 80.0–100.0)
Monocytes Absolute: 0.8 10*3/uL (ref 0.1–1.0)
Monocytes Relative: 8 %
Neutro Abs: 7.8 10*3/uL — ABNORMAL HIGH (ref 1.7–7.7)
Neutrophils Relative %: 81 %
Platelets: 125 10*3/uL — ABNORMAL LOW (ref 150–400)
RBC: 3.57 MIL/uL — ABNORMAL LOW (ref 4.22–5.81)
RDW: 13.8 % (ref 11.5–15.5)
WBC: 9.7 10*3/uL (ref 4.0–10.5)
nRBC: 0 % (ref 0.0–0.2)

## 2020-10-03 LAB — COMPREHENSIVE METABOLIC PANEL
ALT: 45 U/L — ABNORMAL HIGH (ref 0–44)
AST: 27 U/L (ref 15–41)
Albumin: 2.5 g/dL — ABNORMAL LOW (ref 3.5–5.0)
Alkaline Phosphatase: 48 U/L (ref 38–126)
Anion gap: 0 — ABNORMAL LOW (ref 5–15)
BUN: 27 mg/dL — ABNORMAL HIGH (ref 8–23)
CO2: 28 mmol/L (ref 22–32)
Calcium: 8 mg/dL — ABNORMAL LOW (ref 8.9–10.3)
Chloride: 127 mmol/L — ABNORMAL HIGH (ref 98–111)
Creatinine, Ser: 1.08 mg/dL (ref 0.61–1.24)
GFR, Estimated: >60 mL/min (ref >60)
Glucose, Bld: 190 mg/dL — ABNORMAL HIGH (ref 70–99)
Potassium: 3.9 mmol/L (ref 3.5–5.1)
Sodium: 154 mmol/L — ABNORMAL HIGH (ref 135–145)
Total Bilirubin: 0.4 mg/dL (ref 0.3–1.2)
Total Protein: 5.4 g/dL — ABNORMAL LOW (ref 6.5–8.1)

## 2020-10-03 LAB — GLUCOSE, CAPILLARY
Glucose-Capillary: 144 mg/dL — ABNORMAL HIGH (ref 70–99)
Glucose-Capillary: 179 mg/dL — ABNORMAL HIGH (ref 70–99)
Glucose-Capillary: 186 mg/dL — ABNORMAL HIGH (ref 70–99)
Glucose-Capillary: 191 mg/dL — ABNORMAL HIGH (ref 70–99)
Glucose-Capillary: 203 mg/dL — ABNORMAL HIGH (ref 70–99)
Glucose-Capillary: 213 mg/dL — ABNORMAL HIGH (ref 70–99)

## 2020-10-03 LAB — MAGNESIUM: Magnesium: 2.5 mg/dL — ABNORMAL HIGH (ref 1.7–2.4)

## 2020-10-03 LAB — SODIUM
Sodium: 148 mmol/L — ABNORMAL HIGH (ref 135–145)
Sodium: 155 mmol/L — ABNORMAL HIGH (ref 135–145)

## 2020-10-03 LAB — PHOSPHORUS: Phosphorus: 2.9 mg/dL (ref 2.5–4.6)

## 2020-10-03 MED ORDER — LORAZEPAM 2 MG/ML IJ SOLN
INTRAMUSCULAR | Status: AC
  Filled 2020-10-03: qty 1

## 2020-10-03 MED ORDER — LEVETIRACETAM IN NACL 500 MG/100ML IV SOLN
500.0000 mg | Freq: Two times a day (BID) | INTRAVENOUS | Status: DC
  Administered 2020-10-03 – 2020-10-04 (×2): 500 mg via INTRAVENOUS
  Filled 2020-10-03 (×2): qty 100

## 2020-10-03 NOTE — Progress Notes (Signed)
Transported patient to CT and back to 4N29 without event. 

## 2020-10-03 NOTE — Progress Notes (Signed)
During shift report, this RN and HS RN to room, noticed left upper extremity and head jerking; pt responded to pain and blink to threat intact, pupils unchanged.  Neurology MD Bhagat paged orders for ativan 2mg -hold until MD at beside, MD to bedside. Jerking activity lasting approximately 10 minutes, resolved without medication. Orders for stat head CT. MD T. Dawley made aware of episode as well.

## 2020-10-03 NOTE — Progress Notes (Signed)
OT Cancellation Note  Patient Details Name: Walter [name] MRN: [id number] DOB: [birth year]   Cancelled Treatment:    Reason Eval/Treat Not Completed: Medical issues which prohibited therapy (Per RN, EVD with decreased output. Will hold and return after neuro MD checks in.)  Traeton Bordas M Delmo Matty Norris Bodley MSOT, OTR/L Acute Rehab Pager: [phone] Office: [phone] 10/03/2020, 10:10 AM

## 2020-10-03 NOTE — Progress Notes (Addendum)
1941 called MD H. Elsner regarding EVD 10ml output @ 0900 and no longer pulsatile.  0700, 0800 output both 60ml and pulsatile, though sluggish. Pupils Orders to drop EVD to 78ml H20 at this time.   1015 T. Dawley MD to bedside, EVD flushed by MD. Per Dawley MD, EVD to be at 10 H20 at this time.    1330 Since flushing by Dawley MD, remains 0 output and no pulsatility. Elsner MD made aware. No orders at this time.

## 2020-10-03 NOTE — Progress Notes (Signed)
PT Cancellation Note  Patient Details Name: MIECZYSLAW STAMAS MRN: [id number] DOB: 1956-05-05   Cancelled Treatment:    Reason Eval/Treat Not Completed: Medical issues which prohibited therapy - Per RN, EVD with decreased output. Will hold and return after neuro MD checks in.  Marye Round, PT Acute Rehabilitation Services Pager [phone]  Office [phone]   Truddie Coco 10/03/2020, 10:13 AM

## 2020-10-03 NOTE — Progress Notes (Signed)
NAME:  CAMRIN GEARHEART, MRN:  [id number], DOB:  11/23/1955, LOS: 8 ADMISSION DATE:  09/25/2020, CONSULTATION DATE:  09/25/20 REFERRING MD:  Wilford Corner,  CHIEF COMPLAINT:  Headache, r side weakness, slurred speech  History of Present Illness:  Mr. Orlick is a 65 yo man with hx of afib not on anticoagulation admitted with right-sided weakness, hemorrhagic stroke. Emergently intubated in the ED for airway protection  Pertinent  Medical History  Aflutter/afib (not on ac) S/p Ablation 03/2020, hx of Cardioversion 2015 Hx retinal detachment Covid infection in July 2021 treated with monoclonal antibody Hx CV   Significant Hospital Events: Including procedures, antibiotic start and stop dates in addition to other pertinent events   . 3/28- Admit, intubated.  Not a candidate for TPA due to intracranial hemorrhage.  Started 3% saline . 3/29- EVD placement for developing hydrocephalus. CTA of the head was unremarkable . 3/30- Extubated. . 4/1 respiratory culture grew Moraxella, on ceftriaxone . 4/4 reintubated because of hypoxia and unresponsiveness  Interim History / Subjective:  Remain lethargic, last night he became unresponsive and hypoxic Spiked fever last night with T-max 102.1  Objective   Blood pressure 137/61, pulse 60, temperature 98.4 F (36.9 C), temperature source Axillary, resp. rate (!) 25, height 6\' 2"  (1.88 m), weight 93.2 kg, SpO2 100 %.    Vent Mode: PSV;CPAP FiO2 (%):  [40 %] 40 % Set Rate:  [20 bmp] 20 bmp Vt Set:  [650 mL] 650 mL PEEP:  [5 cmH20] 5 cmH20 Pressure Support:  [8 cmH20] 8 cmH20 Plateau Pressure:  [16 cmH20-21 cmH20] 17 cmH20   Intake/Output Summary (Last 24 hours) at 10/03/2020 1015 Last data filed at 10/03/2020 1000 Gross per 24 hour  Intake 3153.23 ml  Output 2353 ml  Net 800.23 ml   Filed Weights   09/30/20 0400 10/01/20 0400 10/02/20 0400  Weight: 102.6 kg 102.1 kg 93.2 kg    Examination: Gen:      Elderly Caucasian male, lying in the bed, orally  intubated HEENT:  EOMI, sclera anicteric, EVD in place, not draining well. Severely dry mucous membranes, ETT/EVD in place Neck:     No masses; no thyromegaly Lungs:    Coarse breath sounds at bases right more than left; normal respiratory effort CV:         Regular rate and rhythm; no murmurs Abd:      + bowel sounds; soft, non-tender; no palpable masses, no distension Ext:    No edema; adequate peripheral perfusion Skin:      Warm and dry; no rash Neuro:   Eyes closed, does not open with painful stimuli, positive cough gag and corneal reflexes.  Left side is antigravity   Labs/imaging that I havepersonally reviewed  (right click and "Reselect all SmartList Selections" daily)  Sodium 146 Tracheal aspirate is growing Moraxella  CT head 4/1: New intraventricular hemorrhage in the third and lateral ventricles. Fourth ventricular and midbrain hemorrhage is unchanged. 2. New right frontal EVD which traverses the right lateral ventricle. Blood clot encompasses the lower catheter and ventriculomegaly is similar to prior. 3. Small cortical infarcts are newly seen along the superior left frontal convexity  CT head 4/3: 1. Stable right superior frontal approach EVD with decreased volume of intraventricular hemorrhage and improved ventricular decompression from 2 days ago.  2. Small infarcts have developed in both the left superior frontal gyrus and the left cerebellum PICA territory since the MRI 09/25/2020, not significantly changed from 09/29/2020.  3. Left dorsal midbrain intra-axial  hemorrhage has not significantly changed since presentation. Associated brainstem edema.  4. Stable trace subarachnoid hemorrhage is stable and may be related to EVD placement.  CT head 4/4: 1. Interval decrease in intraventricular hemorrhage with decreased size of the lateral ventricles as compared to prior, right greater than left. Right parietal approach ventriculostomy in stable position with tip  near the septum pellucidum. 2. Stable small volume subarachnoid and intraparenchymal hemorrhage along the catheter tract. 3. Stable 1.9 cm hemorrhage at the left dorsal pons. 4. No other new acute intracranial abnormality  Resolved Hospital Problem list     Assessment & Plan:  Acute midbrain/dorsal pons/thalamic intraparenchymal hemorrhage with IVH, ICH score of 2 Acute obstructive hydrocephalus s/p EVD EVD stopped working again, neurosurgery is at bedside, trying to flush to see if that works Repeat head CT showed improvement in hydrocephalus Still on 3% saline, serum sodium goal 155-165, currently serum sodium is 155 stroke team is following Continue secondary stroke prophylaxis  Acute encephalopathy due to ICH Avoid sedation Closely monitor mental status  Acute respiratory failure due to stroke Patient is tolerating pressure support trial but his mental status preventing to extubate Continue lung protective ventilation Spoke with patient's wife and daughter, they are in agreement to proceed with tracheostomy considering his poor mental status Please schedule for tracheostomy tomorrow  Community-acquired pneumonia with Moraxella and possible aspiration during intubation event Sputum culture grew Moraxella X-ray chest is suggestive of right lower lobe infiltrate Repeated sputum culture is pending Patient is spiked fever with T-max 102.1, white count trended up from 10-16 Continue Unasyn, adjust antibiotic based on respiratory culture  Paroxysmal atrial fibrillation Patient heart rate is well controlled Currently he is in sinus rhythm with a stable heart rate Not on anticoagulation as an outpatient Continue home diltiazem 30mg  q6h and digoxin  Acute kidney injury due to severe dehydration Serum creatinine improved from 1.8-1.1 after IV fluid resuscitation Will give him 1 more liter of IV fluid with LR Monitor intake and output Repeat BMP in the morning  Best Practice  (right click and "Reselect all SmartList Selections" daily)   Diet:  Tube Feed  Pain/Anxiety/Delirium protocol (if indicated): RASS goal 0 VAP protocol (if indicated): Yes DVT prophylaxis: SCD GI prophylaxis: PPI Glucose control:  SSI Yes Central venous access:  N/A Arterial line:  N/A Foley:  N/A Mobility:  bed rest  PT consulted: Yes Code Status:  full code Prognosis: Guarded Last date of multidisciplinary goals of care discussion 78/5 Spoke with patient's daughter and wife, explained situation, they would like to proceed with tracheostomy and continue aggressive care   Total critical care time: 41 minutes  Performed by: 6/11   Critical care time was exclusive of separately billable procedures and treating other patients.   Critical care was necessary to treat or prevent imminent or life-threatening deterioration.   Critical care was time spent personally by me on the following activities: development of treatment plan with patient and/or surrogate as well as nursing, discussions with consultants, evaluation of patient's response to treatment, examination of patient, obtaining history from patient or surrogate, ordering and performing treatments and interventions, ordering and review of laboratory studies, ordering and review of radiographic studies, pulse oximetry and re-evaluation of patient's condition.   Cheri Fowler MD [facility] See Amion for pager If no response to pager, please call [phone] until 7pm After 7pm, Please call E-link [phone]

## 2020-10-03 NOTE — Progress Notes (Signed)
LTM EEG hooked up and running - no initial skin breakdown - push button tested - neuro notified. [facility] monitored event button tested

## 2020-10-03 NOTE — Progress Notes (Signed)
Tracheal aspirate collected and sent to lab. 

## 2020-10-03 NOTE — Progress Notes (Signed)
Patient ID: Walter [name], male   DOB: 1955/10/25, 65 y.o.   MRN: [id number] Fevers noted. IVC stopped draining however ventricles have been small. Will check CT in am, if vents remain small may not need ivc anymore. Understand patient is for trach tomorrow.

## 2020-10-03 NOTE — Progress Notes (Addendum)
Neurology Progress Note   Major interval events:  Rhythmic shaking of the LUE c/f focal seizure Febrile to a T-max of 39.1 at 4 PM today  Subjective: Minimally responsive  Exam: Vitals:   10/03/20 1800 10/03/20 1830  BP: 140/77 (!) 147/74  Pulse: (!) 102 (!) 103  Resp: (!) 24 (!) 26  Temp: (!) 100.7 F (38.2 C)   SpO2: 100% 100%   Gen: In bed, comfortable  Resp: comfortable on vent Cardiac: Perfusing extremities well  Abd: soft, nt  Neuro: MS: Minimally responsive, does not follow commands, no verbal output CN: Eyes midline.  Pupils equal round reactive 3 to 2 mm, sluggish.  Corneals intact to light touch bilaterally, but weaker on the left.  VOR sluggish but present. cough and gag intact per nursing Motor: Some slight rhythmic twitching of the left upper extremity and left face noted on my initial evaluation which quickly resolved.  Per nursing has been somewhat intermittent for 30 minutes but had become much more intense and was already resolving by the time of my arrival Sensory: Withdraws left upper extremity to noxious stimulation  Pertinent Labs: Leukocytosis improving from 16.3-11 AKI improving from 1.8-1.4  Estimated Creatinine Clearance: 75.1 mL/min (by C-G formula based on SCr of 1.14 mg/dL).   Impression: Likely focal motor seizure in the setting of his hemorrhages; there is some cortical hemorrhage close to the left arm motor strip.  Given his poor mental status will additionally obtain long-term EEG monitoring to confirm no intermittent seizure activity contributing to his mental status; will start Keppra given my very high index of suspicion that this was in fact a clinical seizure.  Recommendations: -Stat head CT to rule out any worsening intracranial process, completed and stable on my personal review -Stat labs including CMP, Mag, Phos, CBC to look for seizure threshold lowering derangements -Long-term EEG monitoring -Keppra 500 mg BID   Brooke Dare  MD-PhD [facility] [phone]   CRITICAL CARE Performed by: Gordy Councilman   Total critical care time: 22 minutes -- billing per Dr. Roda Shutters today  Critical care time was exclusive of separately billable procedures and treating other patients.  Critical care was necessary to treat or prevent imminent or life-threatening deterioration.  Critical care was time spent personally by me on the following activities: development of treatment plan with patient and/or surrogate as well as nursing, discussions with consultants, evaluation of patient's response to treatment, examination of patient, obtaining history from patient or surrogate, ordering and performing treatments and interventions, ordering and review of laboratory studies, ordering and review of radiographic studies, and re-evaluation of patient's condition.

## 2020-10-03 NOTE — Progress Notes (Addendum)
STROKE TEAM PROGRESS NOTE  Significant Events: 3/29 s/p EVD 3/30 extubated  4/4 Required re-intubation for resp failure with hypoxia and unresponsiveness. Repeat CT showed clearance of IVH at third ventricle and upper lateral ventricles. Decompressed right lateral ventricle.   INTERVAL HISTORY No acute events overnight  Remains intubated and sedated on precedex.    Dr. Merrily Pew to discuss possible need for trach/PEG with family today.   Daughter and wife at bedside. They are feeling overwhelmed and fatigued. We discussed his neurologic status and current treatment. Questions addressed. Supportive discussion regarding their situation provided.    Vitals:   10/03/20 0700 10/03/20 0730 10/03/20 0800 10/03/20 0830  BP: 105/65 115/64 119/62 135/78  Pulse: 83 (!) 58 75 83  Resp: 20 (!) 21 (!) 21 (!) 29  Temp:   98.4 F (36.9 C)   TempSrc:   Axillary   SpO2: 100% 100% 100% 100%  Weight:      Height:       CBC:  Recent Labs  Lab 10/02/20 0534 10/02/20 0859 10/03/20 0613  WBC 16.3*  --  QUESTIONABLE RESULTS, RECOMMEND RECOLLECT TO VERIFY  HGB 14.5 13.6 QUESTIONABLE RESULTS, RECOMMEND RECOLLECT TO VERIFY  HCT 44.9 40.0 QUESTIONABLE RESULTS, RECOMMEND RECOLLECT TO VERIFY  MCV 100.0  --  QUESTIONABLE RESULTS, RECOMMEND RECOLLECT TO VERIFY  PLT 169  --  QUESTIONABLE RESULTS, RECOMMEND RECOLLECT TO VERIFY   Basic Metabolic Panel:  Recent Labs  Lab 09/27/20 1602 09/27/20 2245 09/28/20 0512 09/28/20 1106 10/02/20 0534 10/02/20 0859 10/02/20 1059 10/03/20 0021 10/03/20 0613  NA 155*   < > 151*   < > 150* 153*   < > 148* QUESTIONABLE RESULTS, RECOMMEND RECOLLECT TO VERIFY  K  --   --   --    < > 4.1 3.7  --   --  QUESTIONABLE RESULTS, RECOMMEND RECOLLECT TO VERIFY  CL  --   --   --    < > 117*  --   --   --  QUESTIONABLE RESULTS, RECOMMEND RECOLLECT TO VERIFY  CO2  --   --   --    < > 23  --   --   --  QUESTIONABLE RESULTS, RECOMMEND RECOLLECT TO VERIFY  GLUCOSE  --   --   --    <  > 164*  --   --   --  QUESTIONABLE RESULTS, RECOMMEND RECOLLECT TO VERIFY  BUN  --   --   --    < > 36*  --   --   --  QUESTIONABLE RESULTS, RECOMMEND RECOLLECT TO VERIFY  CREATININE  --   --   --    < > 1.81*  --   --   --  QUESTIONABLE RESULTS, RECOMMEND RECOLLECT TO VERIFY  CALCIUM  --   --   --    < > 8.9  --   --   --  QUESTIONABLE RESULTS, RECOMMEND RECOLLECT TO VERIFY  MG 2.1  --  2.1  --   --   --   --   --   --   PHOS 2.5  --  3.3  --   --   --   --   --   --    < > = values in this interval not displayed.   Lipid Panel:  No results for input(s): CHOL, TRIG, HDL, CHOLHDL, VLDL, LDLCALC in the last 168 hours. HgbA1c:  No results for input(s): HGBA1C in the last 168 hours. Urine  Drug Screen:  No results for input(s): LABOPIA, COCAINSCRNUR, LABBENZ, AMPHETMU, THCU, LABBARB in the last 168 hours.  Alcohol Level  No results for input(s): ETH in the last 168 hours.  IMAGING  10/02/2020 CT head IMPRESSION: 1. Interval decrease in intraventricular hemorrhage with decreased size of the lateral ventricles as compared to prior, right greater than left. Right parietal approach ventriculostomy in stable position with tip near the septum pellucidum. 2. Stable small volume subarachnoid and intraparenchymal hemorrhage along the catheter tract. 3. Stable 1.9 cm hemorrhage at the left dorsal pons. 4. No other new acute intracranial abnormality. 09/29/2020  CT head IMPRESSION: 1. New intraventricular hemorrhage in the third and lateral ventricles. Fourth ventricular and midbrain hemorrhage is unchanged. 2. New right frontal EVD which traverses the right lateral ventricle. Blood clot encompasses the lower catheter and ventriculomegaly is similar to prior. 3. Small cortical infarcts are newly seen along the superior left frontal convexity.  09/26/2020 IR Angio IMPRESSION: 1. No evidence of an AVM, dural arteriovenous fistula, aneurysm or other vascular abnormality to explain patient's  brainstem hemorrhage.  09/26/2020 CT head:  IMPRESSION: 1. Unchanged left brainstem hemorrhage extending into the fourth ventricle. 2. Worsening hydrocephalus.  09/25/2020 MRI brain w/wo, MR Venogram IMPRESSION: 1. Acute hemorrhage centered within the left dorsal midbrain extending into the dorsal pons and inferomedial thalamus with intraventricular extension. No abnormal enhancement to suggest underlying lesion. 2. Compression of the cerebral aqueduct with possible mild obstructive hydrocephalus. 3. No evidence of dural sinus thrombosis. Attenuation of the left basal vein in the quadrigeminal cistern without definite occlusion.  09/25/2020 CT Angio head/neck IMPRESSION: 1. No vascular malformation, anomaly or aneurysm. 2. No emergent large vessel occlusion or high-grade stenosis of the intracranial arteries. 3. Unchanged size of dorsal left pontine hemorrhage with intraventricular extension.  09/25/2020 CT head code stroke IMPRESSION: Acute intraparenchymal hemorrhage in the dorsal left pons with subarachnoid extension into the cerebral aqueduct and fourth Ventricle.   PHYSICAL EXAM Blood pressure 135/78, pulse 83, temperature 98.4 F (36.9 C), temperature source Axillary, resp. rate (!) 29, height 6\' 2"  (1.88 m), weight 93.2 kg, SpO2 100 %.  General: Elderly caucasian male, critically ill, intubated orally. Right frontal ventriculostomy catheter in place Lungs: Symmetrical Chest rise, no labored breathing Cardio: Irregular Abdomen: Soft, non-tender  Neurological Exam :  Intubated, sedated. Not responsive to verbal, tactile or noxious stimuli except brief furrowing of forehead with forced eye opening. Eyes in mid position. PERRL. Positive doll's eyes,  Minimal corneal responsiveness. No blink to threat. Facial symmetry assessment impaired by oral intubation.  +cough with head turning for doll eyes assessment.   Motor: No withdrawal to noxious stimuli x 4  extremities  Tone and bulk:normal tone throughout; no atrophy noted  ASSESSMENT/PLAN [name] is a 65 y.o. male with history of atrial flutter newly diagnosed with atrial fibrillation in February 2022 not on anticoagulation, follows with Dr. March 2022 in cardiology.  He presented with right-sided weakness, slurred speech, headache, and vomiting.Last known well when he went to bed at 10 PM on 09/24/2020 and woke up at around 2 AM on the day of admission with slurred speech.  EMS was called.He was very drowsy upon their assessment and notable right side weakeness.  He had worsening mental status, GCS 5, so was emergently intubated in ED for airway protection.  His MRI brain showed acute hemorrhage at left midbrain extending into pons with IVH associated with obstructive hydrocephalus.    ICH - Left Dorsalmidbrain ICHextending into dorsal  pons and inferior thalamus with mild mass-effect on third ventricle and hydrocephalus,source unknown, suspect occult cavernoma  Code Stroke CT head-Acute IPH in the dorsal left pons with subarachnoid extension into the cerebral aqueduct and fourth ventricle.  CTA head & neck-No vascular malformation, anomaly or aneurysm. No emergent large vessel occlusion or high-grade stenosis. Unchanged size of dorsal left pontine hemorrhage with intraventricular extension.  MRI& MR Venogram-Acute hemorrhage centered within the left dorsal midbrain extending into the dorsal pons and inferomedial thalamus with intraventricular extension. Compression of the cerebral aqueduct with possible mild obstructive hydrocephalus. No evidence of dural sinus thrombosis.   CT Head W/O Contrast 3/29 0250 -Unchanged left brainstem hemorrhage extending into the fourth ventricle. Worsening hydrocephalus.  Diagnostic Cerebral Angiogram - No evidence of an aneurysm, AVM, dural AV fistula or other vascular abnormality to explain patient's known intraparenchymal hemorrhage.  2D  Echo- EF:55-60%. No wall motion abnormalities.  LDL- 88  HgbA1c5.7  VTE prophylaxis -subcu heparin  No antithromboticprior to admission, now on Aspirin 81 mg. Follow up in clinic regarding further use of antiplatelet vs. anticoagulation.  Therapy recommendations:Pending  Disposition:TBD  Obstructive hydrocephalus   EVD placed on 3/30   CT 4/1 showed clot in right lateral ventricle around catheter.   S/p 1mg  (1cc) alteplase placed in EVD 09/29/20 -> drain well ->ICP measuring 4-11 with a good waveform  CT head (4/3) resolution of most of the IVH and improved hydrocephalus.  EVD stopped working 4/2 overnight. 0.5mg  alteplase placed in EVD 10/01/2020 -> Post alteplase CT head new hemorrhage along the EVD tract an in the right lateral ventricle.  CT repeat 4/4 - decreased IVH and decompressed right lateral ventricle  CT repeat in am  Cerebral Edema  PICC line placement 4/3 for hypertonic saline solution  On 3% saline @ 25cc  Na goal is 150-155  Sodium level: 147->148->146->148->145->152->155  Follow sodium q6hr  Acute Respiratory Failure  Required intubation on 4/4 due to worsening MS and hypoxia  Full vent support  CCM managing  Aspiration Pneumonia Community-acquired pneumonia with Moraxella Fever   Temp 102.4  Sputum culture (3/30): Moraxella  Ceftriaxone  dc'ed 4/1, Unasyn 3g q6hr  WBC 10.7->16.3->11->9.7  CCM following  AKI   Creatinine 0.74 -> 1.81->1.14->1.08  On IV fluid and tube feeding  CCM on board  Close monitoring  Hypertension  Home meds:Diltiazem  Currently on Diltiazem 30 mg q6  BP goal Systolic < 160  Long-term BP goal normotensive  Aflutter and Afib  Not on [facility] before admission  S/p CTI ablation for A flutter  Currently on ASA  Not AC candidate at this time.  Follow up in clinic regarding further use of antiplatelet vs. anticoagulation.  Hyperlipidemia  Home meds:none  LDL88, goal <  70  Addstatin once hemorrhage is stable  Continue statin at discharge  Dysphagia  Secondary to hemorrhagic stroke  NPO  SLP on board  Now with neurologic decline will likely require PEG  Tube feeds @ 25ml/hr   Other Stroke Risk Factors  Advanced Age >/= 65  Snoring: sleep study denied by insurance prior to admission   Other Active Problems:  Hypokalemia, resolved  This plan of care was discussed with an directed by Dr. 77m.   Roda Shutters, NP-C  ATTENDING NOTE: I reviewed above note and agree with the assessment and plan. Pt was seen and examined.   No family at bedside.  Patient still intubated, on precedex low dose, eyes closed, not open on voice or pain stimulation. Not  follow commands. Bilateral eyes mid position, slight disconjugate gaze with left upward and right downward disconjugate, sluggish doll's eyes and pupillary reflex but pupils 62mm bilaterally,weak corneal reflex on the left but strongly positive on the right, positive gag and cough. No moving extremities on pain stimulation, but he did have one time spontaneous movement of LUE bicep flexion against gravity.   Patient EVD stopped draining, neurosurgery on board, manipulated EVD, still not much drainage, concerning for right lateral ventricle decompressed evidence from CT yesterday.  Will repeat CT in a.m.  If ventricular size stable, DVT may not needed.  CCM discussed with patient family, plan for tracheostomy tomorrow.  Developed fever, on Unasyn, CCM on board.  For detailed assessment and plan, please refer to above as I have made changes wherever appropriate.   Marvel Plan, MD PhD Stroke Neurology 10/03/2020 10:33 PM  This patient is critically ill due to brainstem ICH, hydrocephalus, respiratory failure, cerebral edema, fever with pneumonia a flutter and A. fib and at significant risk of neurological worsening, death form cerebral edema, brain herniation, obstructive hydrocephalus, heart  failure, septic shock. This patient's care requires constant monitoring of vital signs, hemodynamics, respiratory and cardiac monitoring, review of multiple databases, neurological assessment, discussion with family, other specialists and medical decision making of high complexity. I spent 35 minutes of neurocritical care time in the care of this patient.

## 2020-10-03 NOTE — Progress Notes (Signed)
Pt's tube holder changed at this time.  Pt became agitated and raised arm, blindly.  ETT holder now placed higher on pt's head since this is the second change in 24 hours.

## 2020-10-03 NOTE — Progress Notes (Signed)
Pt placed on PSV 8/5 and is tolerating well at this time. RT to continue to monitor.

## 2020-10-03 NOTE — Progress Notes (Signed)
Was asked to evaluate the patient's ventricular catheter by Dr. Danielle Dess, the is obvious clot in the proximal and distal tubing.  Using sterile technique, this was flushed distally and gently aspirated approximately twice proximally.  At this point the catheter appears clear is flushed proximally and distally.  There is no spontaneous flow of drinking however in reviewing the CT scan from yesterday there appears to be collapse of the right lateral ventricle without trapping of the contralateral ventricle therefore I do not believe anything would be spontaneously draining this point.  Discussed with Dr. Danielle Dess, need EVD open at 10 mmHg.    Walter Pouch, DO Neurosurgeon

## 2020-10-04 ENCOUNTER — Inpatient Hospital Stay (HOSPITAL_COMMUNITY): Payer: Medicare Other

## 2020-10-04 DIAGNOSIS — I629 Nontraumatic intracranial hemorrhage, unspecified: Secondary | ICD-10-CM | POA: Diagnosis not present

## 2020-10-04 DIAGNOSIS — G9340 Encephalopathy, unspecified: Secondary | ICD-10-CM | POA: Diagnosis not present

## 2020-10-04 DIAGNOSIS — T783XXA Angioneurotic edema, initial encounter: Secondary | ICD-10-CM

## 2020-10-04 DIAGNOSIS — I480 Paroxysmal atrial fibrillation: Secondary | ICD-10-CM | POA: Diagnosis not present

## 2020-10-04 DIAGNOSIS — R569 Unspecified convulsions: Secondary | ICD-10-CM | POA: Diagnosis not present

## 2020-10-04 DIAGNOSIS — J9602 Acute respiratory failure with hypercapnia: Secondary | ICD-10-CM | POA: Diagnosis not present

## 2020-10-04 DIAGNOSIS — I613 Nontraumatic intracerebral hemorrhage in brain stem: Secondary | ICD-10-CM | POA: Diagnosis not present

## 2020-10-04 LAB — COMPREHENSIVE METABOLIC PANEL
ALT: 45 U/L — ABNORMAL HIGH (ref 0–44)
AST: 40 U/L (ref 15–41)
Albumin: 2.6 g/dL — ABNORMAL LOW (ref 3.5–5.0)
Alkaline Phosphatase: 51 U/L (ref 38–126)
Anion gap: 7 (ref 5–15)
BUN: 28 mg/dL — ABNORMAL HIGH (ref 8–23)
CO2: 22 mmol/L (ref 22–32)
Calcium: 8.5 mg/dL — ABNORMAL LOW (ref 8.9–10.3)
Chloride: 128 mmol/L — ABNORMAL HIGH (ref 98–111)
Creatinine, Ser: 0.99 mg/dL (ref 0.61–1.24)
GFR, Estimated: >60 mL/min (ref >60)
Glucose, Bld: 211 mg/dL — ABNORMAL HIGH (ref 70–99)
Potassium: 4.2 mmol/L (ref 3.5–5.1)
Sodium: 157 mmol/L — ABNORMAL HIGH (ref 135–145)
Total Bilirubin: 1.1 mg/dL (ref 0.3–1.2)
Total Protein: 5.5 g/dL — ABNORMAL LOW (ref 6.5–8.1)

## 2020-10-04 LAB — URINALYSIS, ROUTINE W REFLEX MICROSCOPIC
Bilirubin Urine: NEGATIVE
Glucose, UA: 50 mg/dL — AB
Ketones, ur: NEGATIVE mg/dL
Leukocytes,Ua: NEGATIVE
Nitrite: NEGATIVE
Protein, ur: 100 mg/dL — AB
RBC / HPF: >50 RBC/hpf — ABNORMAL HIGH (ref 0–5)
Specific Gravity, Urine: 1.028 (ref 1.005–1.030)
pH: 5 (ref 5.0–8.0)

## 2020-10-04 LAB — GLUCOSE, CAPILLARY
Glucose-Capillary: 135 mg/dL — ABNORMAL HIGH (ref 70–99)
Glucose-Capillary: 146 mg/dL — ABNORMAL HIGH (ref 70–99)
Glucose-Capillary: 146 mg/dL — ABNORMAL HIGH (ref 70–99)
Glucose-Capillary: 171 mg/dL — ABNORMAL HIGH (ref 70–99)
Glucose-Capillary: 175 mg/dL — ABNORMAL HIGH (ref 70–99)
Glucose-Capillary: 207 mg/dL — ABNORMAL HIGH (ref 70–99)

## 2020-10-04 LAB — CBC
HCT: 35.9 % — ABNORMAL LOW (ref 39.0–52.0)
Hemoglobin: 11.2 g/dL — ABNORMAL LOW (ref 13.0–17.0)
MCH: 31.4 pg (ref 26.0–34.0)
MCHC: 31.2 g/dL (ref 30.0–36.0)
MCV: 100.6 fL — ABNORMAL HIGH (ref 80.0–100.0)
Platelets: 121 10*3/uL — ABNORMAL LOW (ref 150–400)
RBC: 3.57 MIL/uL — ABNORMAL LOW (ref 4.22–5.81)
RDW: 14.1 % (ref 11.5–15.5)
WBC: 10.3 10*3/uL (ref 4.0–10.5)
nRBC: 0 % (ref 0.0–0.2)

## 2020-10-04 LAB — SODIUM
Sodium: 158 mmol/L — ABNORMAL HIGH (ref 135–145)
Sodium: 158 mmol/L — ABNORMAL HIGH (ref 135–145)

## 2020-10-04 LAB — BASIC METABOLIC PANEL
BUN: 26 mg/dL — ABNORMAL HIGH (ref 8–23)
CO2: 24 mmol/L (ref 22–32)
Calcium: 7.8 mg/dL — ABNORMAL LOW (ref 8.9–10.3)
Chloride: >130 mmol/L (ref 98–111)
Creatinine, Ser: 0.95 mg/dL (ref 0.61–1.24)
GFR, Estimated: >60 mL/min (ref >60)
Glucose, Bld: 161 mg/dL — ABNORMAL HIGH (ref 70–99)
Potassium: 3.4 mmol/L — ABNORMAL LOW (ref 3.5–5.1)
Sodium: 171 mmol/L (ref 135–145)

## 2020-10-04 MED ORDER — PROPOFOL 10 MG/ML IV BOLUS
100.0000 mg | Freq: Once | INTRAVENOUS | Status: AC
  Administered 2020-10-04: 100 mg via INTRAVENOUS
  Filled 2020-10-04: qty 20

## 2020-10-04 MED ORDER — SODIUM CHLORIDE 0.9 % IV SOLN
200.0000 mg | Freq: Once | INTRAVENOUS | Status: AC
  Administered 2020-10-04: 200 mg via INTRAVENOUS
  Filled 2020-10-04: qty 20

## 2020-10-04 MED ORDER — ETOMIDATE 2 MG/ML IV SOLN
20.0000 mg | Freq: Once | INTRAVENOUS | Status: AC
  Administered 2020-10-04: 20 mg via INTRAVENOUS
  Filled 2020-10-04: qty 10

## 2020-10-04 MED ORDER — INSULIN GLARGINE 100 UNIT/ML ~~LOC~~ SOLN
6.0000 [IU] | Freq: Every day | SUBCUTANEOUS | Status: DC
  Administered 2020-10-04 – 2020-10-28 (×24): 6 [IU] via SUBCUTANEOUS
  Filled 2020-10-04 (×25): qty 0.06

## 2020-10-04 MED ORDER — LACTATED RINGERS IV BOLUS
1000.0000 mL | Freq: Once | INTRAVENOUS | Status: AC
  Administered 2020-10-04: 1000 mL via INTRAVENOUS

## 2020-10-04 MED ORDER — VECURONIUM BROMIDE 10 MG IV SOLR
10.0000 mg | Freq: Once | INTRAVENOUS | Status: AC
  Administered 2020-10-04: 10 mg via INTRAVENOUS
  Filled 2020-10-04: qty 10

## 2020-10-04 MED ORDER — SODIUM CHLORIDE 0.9 % IV SOLN
100.0000 mg | Freq: Two times a day (BID) | INTRAVENOUS | Status: DC
  Administered 2020-10-05 – 2020-10-07 (×6): 100 mg via INTRAVENOUS
  Filled 2020-10-04 (×10): qty 10

## 2020-10-04 NOTE — Progress Notes (Signed)
Called portable to order cooling blanket for pt d/t persistent fever. Machines are currently out of stock. Will continue applying ice packs as needed.

## 2020-10-04 NOTE — Progress Notes (Addendum)
STROKE TEAM PROGRESS NOTE  Significant Events: 3/29 s/p EVD 3/30 extubated  4/4 Required re-intubation for resp failure with hypoxia and unresponsiveness. Repeat CT showed clearance of IVH at third ventricle and upper lateral ventricles. Decompressed right lateral ventricle.  4/5 Seizure activity suspected, repeat HCT stable, Keppra started.  INTERVAL HISTORY No family at bedside. RN and RT present at bedside.  Febrile to 102.4 overnight IVC stopped draining yesterday afternoon. Catheter flushed by Dr. Jake Samples. Concern for seizure activity overnight. Keppra started.Repeat HCT stable  LTM implemented 1120 pm.   Per discussion with Dr. Danielle Dess he plans to IVC in place and monitor. Dr. Jake Samples to cover while he is traveling.   Per discussion with CCM, Dr. Merrily Pew, trach will be placed today.   Vitals:   10/04/20 0400 10/04/20 0500 10/04/20 0600 10/04/20 0639  BP: (!) 147/66 (!) 159/65 (!) 169/63 (!) 164/61  Pulse: 66 (!) 59 63   Resp: (!) 21 20 20    Temp: (!) 101.1 F (38.4 C)   (!) 100.8 F (38.2 C)  TempSrc: Axillary   Axillary  SpO2: 100% 100% 100%   Weight:      Height:       CBC:  Recent Labs  Lab 10/03/20 2035 10/04/20 0602  WBC 9.7 10.3  NEUTROABS 7.8*  --   HGB 11.5* 11.2*  HCT 36.1* 35.9*  MCV 101.1* 100.6*  PLT 125* 121*   Basic Metabolic Panel:  Recent Labs  Lab 09/28/20 0512 09/28/20 1106 10/03/20 2035 10/04/20 0602  NA 151*   < > 154* 171*  K  --    < > 3.9 3.4*  CL  --    < > 127* >130*  CO2  --    < > 28 24  GLUCOSE  --    < > 190* 161*  BUN  --    < > 27* 26*  CREATININE  --    < > 1.08 0.95  CALCIUM  --    < > 8.0* 7.8*  MG 2.1  --  2.5*  --   PHOS 3.3  --  2.9  --    < > = values in this interval not displayed.   Lipid Panel:  No results for input(s): CHOL, TRIG, HDL, CHOLHDL, VLDL, LDLCALC in the last 168 hours. HgbA1c:  No results for input(s): HGBA1C in the last 168 hours. Urine Drug Screen:  No results for input(s): LABOPIA,  COCAINSCRNUR, LABBENZ, AMPHETMU, THCU, LABBARB in the last 168 hours.  Alcohol Level  No results for input(s): ETH in the last 168 hours.  IMAGING  CT head 4/6 1. Ventricle size shows mild interval enlargement since yesterday. No significant hydrocephalus. Intraventricular hemorrhage stable 2. Right frontal ventricular drain unchanged. Hemorrhage surrounding the drain unchanged. 3. Subarachnoid hemorrhage unchanged. Hemorrhage in the left tectum unchanged. No new hemorrhage identified.  CT head 4/4 1. Interval decrease in intraventricular hemorrhage with decreased size of the lateral ventricles as compared to prior, right greater than left. Right parietal approach ventriculostomy in stable position with tip near the septum pellucidum. 2. Stable small volume subarachnoid and intraparenchymal hemorrhage along the catheter tract. 3. Stable 1.9 cm hemorrhage at the left dorsal pons. 4. No other new acute intracranial abnormality. 09/29/2020  CT head 4/3 IMPRESSION: 1. New intraventricular hemorrhage in the third and lateral ventricles. Fourth ventricular and midbrain hemorrhage is unchanged. 2. New right frontal EVD which traverses the right lateral ventricle. Blood clot encompasses the lower catheter and ventriculomegaly is similar to  prior. 3. Small cortical infarcts are newly seen along the superior left frontal convexity.  09/26/2020 IR Angio IMPRESSION: 1. No evidence of an AVM, dural arteriovenous fistula, aneurysm or other vascular abnormality to explain patient's brainstem hemorrhage.  09/26/2020 CT head:  IMPRESSION: 1. Unchanged left brainstem hemorrhage extending into the fourth ventricle. 2. Worsening hydrocephalus.  09/25/2020 MRI brain w/wo, MR Venogram IMPRESSION: 1. Acute hemorrhage centered within the left dorsal midbrain extending into the dorsal pons and inferomedial thalamus with intraventricular extension. No abnormal enhancement to suggest underlying  lesion. 2. Compression of the cerebral aqueduct with possible mild obstructive hydrocephalus. 3. No evidence of dural sinus thrombosis. Attenuation of the left basal vein in the quadrigeminal cistern without definite occlusion.  09/25/2020 CT Angio head/neck IMPRESSION: 1. No vascular malformation, anomaly or aneurysm. 2. No emergent large vessel occlusion or high-grade stenosis of the intracranial arteries. 3. Unchanged size of dorsal left pontine hemorrhage with intraventricular extension.  09/25/2020 CT head code stroke IMPRESSION: Acute intraparenchymal hemorrhage in the dorsal left pons with subarachnoid extension into the cerebral aqueduct and fourth Ventricle.  EEG-LTM 4/5-4/6  This study showed periodic discharges with triphasic morphology at 1.5 to 2.5 Hz which is on the ictal-interictal continuum with low to intermediate potential for seizures.  Additionally there is evidence of moderate to severe diffuse encephalopathy, nonspecific etiology.  No seizures were seen during the study  PHYSICAL EXAM Blood pressure (!) 164/61, pulse 63, temperature (!) 100.8 F (38.2 C), temperature source Axillary, resp. rate 20, height 6\' 2"  (1.88 m), weight 93.2 kg, SpO2 100 %.  General: Elderly caucasian male, critically ill, intubated orally. Sedated.  Right frontal ventriculostomy catheter in place Lungs: Symmetrical Chest rise, no labored breathing Cardio: Irregular Abdomen: Soft, non-tender  Neurological Exam :  Intubated, sedated. Keeps eyes closed.  Eyes in mid position. PERRL. Positive doll's eyes,  Minimal corneal responsiveness. No blink to threat. Facial symmetry assessment impaired by oral intubation. + cough during resp intervention.  Motor: Spontaneously moving left upper and lower extremity without evident purpose and  not in response to commands or stimuli.  No withdrawal to noxious stimuli right hemibody.  Tone and bulk:normal tone throughout; no atrophy  noted  ASSESSMENT/PLAN Mr. CHANEL MCKESSON is a 65 y.o. male with history of atrial flutter newly diagnosed with atrial fibrillation in February 2022 not on anticoagulation, follows with Dr. March 2022 in cardiology.  He presented with right-sided weakness, slurred speech, headache, and vomiting.Last known well when he went to bed at 10 PM on 09/24/2020 and woke up at around 2 AM on the day of admission with slurred speech.  EMS was called.He was very drowsy upon their assessment and notable right side weakeness.  He had worsening mental status, GCS 5, so was emergently intubated in ED for airway protection.  His MRI brain showed acute hemorrhage at left midbrain extending into pons with IVH associated with obstructive hydrocephalus.    ICH - Left Dorsalmidbrain ICHextending into dorsal pons and inferior thalamus with mild mass-effect on third ventricle and hydrocephalus,source unknown, suspect occult cavernoma  Code Stroke CT head-Acute IPH in the dorsal left pons with subarachnoid extension into the cerebral aqueduct and fourth ventricle.  CTA head & neck-No vascular malformation, anomaly or aneurysm. No emergent large vessel occlusion or high-grade stenosis. Unchanged size of dorsal left pontine hemorrhage with intraventricular extension.  MRI& MR Venogram-Acute hemorrhage centered within the left dorsal midbrain extending into the dorsal pons and inferomedial thalamus with intraventricular extension. Compression of the cerebral aqueduct  with possible mild obstructive hydrocephalus. No evidence of dural sinus thrombosis.   CT Head W/O Contrast 3/29 0250 -Unchanged left brainstem hemorrhage extending into the fourth ventricle. Worsening hydrocephalus.  Diagnostic Cerebral Angiogram - No evidence of an aneurysm, AVM, dural AV fistula or other vascular abnormality to explain patient's known intraparenchymal hemorrhage.  2D Echo- EF:55-60%. No wall motion abnormalities.  LDL-  88  HgbA1c5.7  VTE prophylaxis -subcu heparin  No antithromboticprior to admission, now on Aspirin 81 mg. Follow up in clinic regarding further use of antiplatelet vs. anticoagulation.  Therapy recommendations:Pending  Disposition:Pending  Obstructive hydrocephalus   EVD placed on 3/30   CT 4/1 showed clot in right lateral ventricle around catheter.   S/p 1mg  (1cc) alteplase placed in EVD 09/29/20 -> drain well ->ICP measuring 4-11 with a good waveform  CT head (4/3) resolution of most of the IVH and improved hydrocephalus.  EVD stopped working 4/2 overnight. 0.5mg  alteplase placed in EVD 10/01/2020 -> Post alteplase CT head new hemorrhage along the EVD tract an in the right lateral ventricle.  CT repeat 4/4 - decreased IVH and decompressed right lateral ventricle  CT repeat 4/5 -mild interval enlargement of ventricular size, no significant hydrocephalus.  IVH stable.  CT repeat in am  Cerebral Edema  PICC line placement 4/3 for hypertonic saline solution  Off 3% saline now  Na goal is 150-155  Sodium level 147->148->146->148->145->152->155->157  Follow sodium q6hr  Acute Respiratory Failure  Required intubation on 4/4 due to worsening MS and hypoxia  S/p trach 10/04/20  Remains on mech vent  Off precedex  CCM managing  Aspiration Pneumonia Community-acquired pneumonia with Moraxella Fever   Temp 102.4  Sputum culture (3/30): Moraxella  Ceftriaxone  dc'ed 4/1, now on Unasyn 3g q6hr  WBC 10.7->16.3->11->9.7->10.3  CCM following  AKI   Creatinine 0.74 -> 1.81->1.14->1.08->0.95  On IV fluid and tube feeding  CCM on board  Close monitoring  ? Seizure  Left UE intermittent jerking with stimulation  LTM EEG GPEDs with triphasic wave  S/p keppra 10/03/20 -> d/c concerning for angioedema   Now on vimpat 10/04/20  Angioedema   Upper lip edema  Concerning for keppra allergy  DDx including the ET tube biting on the upper lip  Now s/p  trach - continue to monitor  D/c keppra - switched to vimpat  Hypertension  Home meds:Diltiazem  Currently on Diltiazem 30 mg q6  BP goal Systolic < 160  Long-term BP goal normotensive  Aflutter and Afib  Not on [facility] before admission  S/p CTI ablation for A flutter  Currently on ASA  Not AC candidate at this time.  Follow up in clinic regarding further use of antiplatelet vs. anticoagulation.  Hyperlipidemia  Home meds:none  LDL88, goal < 70  Addstatin once hemorrhage is stable  Continue statin at discharge  Dysphagia  Secondary to hemorrhagic stroke  NPO  SLP on board  Now with neurologic decline will likely require PEG  Tube feeds @ 68ml/hr   Other Stroke Risk Factors  Advanced Age >/= 65  Snoring: sleep study denied by insurance prior to admission   Other Active Problems:  Hypokalemia, resolved  This plan of care was discussed with an directed by Dr. Roda Shutters.   Janey Genta, NP-C   ATTENDING NOTE: I reviewed above note and agree with the assessment and plan. Pt was seen and examined.   Patient seen this morning with wife and daughter at bedside.  Overnight event noted, patient has fever with  rhythmic shaking of left upper extremity concerning for focal seizure.  Had long-term EEG placed, treated with Keppra.  CT repeat at that time showed slightly enlarged ventricle size but no hydrocephalus.  Overnight, no significant neuro changes, continue to be unresponsive, not following commands, not open eyes, still has fever.  We did stimulation such as coughing from ventilation, continue to have left upper extremity shaking jerking however no seizure seen on long-term EEG.  However, long-term EEG due to concern for GPEDs with triphasic waves.  Was given Keppra overnight, however this morning patient has upper lip angioedema, concerning for Keppra allergy, will switch to Vimpat.  Plan to have tracheostomy done today with Dr. Merrily Pew CCM.  Will  consider PEG tube later.  Repeat CT in a.m. to decide on EVD which has been stopped draining.  Discussed with Dr. Danielle Dess neurosurgery, will keep EVD for now, will decide after CT repeat in a.m.  For detailed assessment and plan, please refer to above as I have made changes wherever appropriate.   Marvel Plan, MD PhD Stroke Neurology 10/04/2020 6:57 PM  This patient is critically ill due to brainstem ICH, obstructive hydrocephalus, spiking fever, seizure-like activity, respiratory failure and at significant risk of neurological worsening, death form cerebral edema, brain herniation, obstructive hydrocephalus, sepsis, status epilepticus. This patient's care requires constant monitoring of vital signs, hemodynamics, respiratory and cardiac monitoring, review of multiple databases, neurological assessment, discussion with family, other specialists and medical decision making of high complexity. I spent 40 minutes of neurocritical care time in the care of this patient. I had long discussion with wife and daughter at bedside, updated pt current condition, treatment plan and potential prognosis, and answered all the questions.  They expressed understanding and appreciation.

## 2020-10-04 NOTE — Evaluation (Signed)
Occupational Therapy RE- Evaluation Patient Details Name: Walter [name] MRN: [id number] DOB: [birth year] Today's Date: 10/04/2020    History of Present Illness 65 yo male presenting 3/28 with R-sided weakness and slurred speech. Imaging revealed acute hemorrhage centered within the left dorsal midbrain extending into the dorsal pons and inferomedial thalamus with intraventricular extension. EVD placement for developing hydrocephalus on 3/29. Extubated 3/30. Pt with noted decrease in EVD drainage overnight 3/31, proximal and distal clots noted, flushed, repeat CT shows significant clot in R lateral ventricle. EVD with another episode of clotting overnight 4/2, intraventricular TpA administered. Reintubated 4/4. PMH Afib, COVID infection (July 2021), and retinal detachment.   Clinical Impression   Pt with reevaluation due to presentation of medical decline. Pt with EVD clamped this session and no purposeful movement noted. Pt noted to wiggle R LE toes only and pull away to painful stimuli. Pt otherwise stable VSS with no changes with HOB increased. Pt with eyes closed and not response to sternal rub. Pt with wife present. Information obtained for home from wife. Pt could benefit from bil resting hand splints and bil pravalons for contracture management and positioning. Recommend splints 4 on and 4 hours off.     Follow Up Recommendations  SNF;Other (comment) (Vent)    Equipment Recommendations  Wheelchair cushion (measurements OT);Wheelchair (measurements OT);Hospital bed;Other (comment) (respiratory needs at this time)    Recommendations for Other Services Other (comment) (Palliative)     Precautions / Restrictions Precautions Precautions: Fall Precaution Comments: EVD clamped, bil mittens      Mobility Bed Mobility Overal bed mobility: Needs Assistance   Rolling: +2 for physical assistance;Total assist         General bed mobility comments: Bed used to increase HOB without arousal  with re positioning. Recommend pravalon boots and resting hand splints for contracture management    Transfers                 General transfer comment: NT    Balance                                           ADL either performed or assessed with clinical judgement   ADL Overall ADL's : Needs assistance/impaired                                       General ADL Comments: total (A) for all care     Vision Baseline Vision/History: Wears glasses Wears Glasses: At all times Patient Visual Report: No change from baseline Vision Assessment?: Yes Additional Comments: pt with narrowed pupils with minimal reaction to light, pt does not open eyes. Pt requires manual (A) to sustain eye lid opening     Perception     Praxis      Pertinent Vitals/Pain Pain Assessment: No/denies pain     Hand Dominance Right   Extremity/Trunk Assessment Upper Extremity Assessment Upper Extremity Assessment: Generalized weakness;RUE deficits/detail;LUE deficits/detail RUE Deficits / Details: no response to painful stimuli, no active movement noted RUE Sensation: decreased light touch;decreased proprioception RUE Coordination: decreased fine motor;decreased gross motor LUE Deficits / Details: no active movement noted. RN reports movement all morning. no response to painful stimuli. LUE Sensation: decreased light touch;decreased proprioception LUE Coordination: decreased fine motor;decreased gross motor   Lower Extremity Assessment  Lower Extremity Assessment: Defer to PT evaluation   Cervical / Trunk Assessment Cervical / Trunk Assessment: Normal   Communication Communication Communication: Other (comment) (intubated pending trach today 11am)   Cognition Arousal/Alertness: Lethargic Behavior During Therapy: Flat affect Overall Cognitive Status: Difficult to assess                                 General Comments: not following any  commands   General Comments  VSS no change in vitals with OT assessment Vent FIO2 40 Peep 5    Exercises Exercises: Other exercises Other Exercises Other Exercises: PROM shoulder, elbow wrist hand x10 reps bil Other Exercises: PROM hip flexion knee extension/ flexion ankle pumps and hip abduction x10 reps bil   Shoulder Instructions      Home Living Family/patient expects to be discharged to:: Private residence Living Arrangements: Spouse/significant other Available Help at Discharge: Family Type of Home: House Home Access: Stairs to enter Secretary/administrator of Steps: 5   Home Layout: Two level;Able to live on main level with bedroom/bathroom     Bathroom Shower/Tub: Producer, television/film/video: Standard     Home Equipment: None   Additional Comments: dog named ruby      Prior Functioning/Environment Level of Independence: Independent        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        OT Problem List: Decreased strength;Decreased range of motion;Decreased activity tolerance;Impaired balance (sitting and/or standing);Decreased safety awareness;Decreased knowledge of use of DME or AE;Decreased knowledge of precautions      OT Treatment/Interventions: Self-care/ADL training;Therapeutic exercise;Energy conservation;DME and/or AE instruction;Therapeutic activities;Patient/family education    OT Goals(Current goals can be found in the care plan section) Acute Rehab OT Goals Patient Stated Goal: none stated intubated, wife just states "i feel like he is going backward" OT Goal Formulation: With family Potential to Achieve Goals: Fair  OT Frequency: Min 2X/week   Barriers to D/C:            Co-evaluation              AM-PAC OT "6 Clicks" Daily Activity     Outcome Measure Help from another person eating meals?: Total Help from another person taking care of personal grooming?: Total Help from another person toileting, which includes using toliet,  bedpan, or urinal?: Total Help from another person bathing (including washing, rinsing, drying)?: Total Help from another person to put on and taking off regular upper body clothing?: Total Help from another person to put on and taking off regular lower body clothing?: Total 6 Click Score: 6   End of Session Equipment Utilized During Treatment: Oxygen Nurse Communication: Mobility status;Precautions  Activity Tolerance: Patient tolerated treatment well Patient left: in bed;with call bell/phone within reach;with bed alarm set;with family/visitor present;with nursing/sitter in room;Other (comment) (Vent)  OT Visit Diagnosis: Unsteadiness on feet (R26.81);Other abnormalities of gait and mobility (R26.89);Muscle weakness (generalized) (M62.81);Hemiplegia and hemiparesis Hemiplegia - Right/Left: Right Hemiplegia - dominant/non-dominant: Dominant Hemiplegia - caused by: Cerebral infarction                Time: 5329-9242 OT Time Calculation (min): 22 min Charges:  OT General Charges $OT Visit: 1 Visit OT Evaluation $OT Re-eval: 1 Re-eval OT Treatments $Therapeutic Activity: 8-22 mins   Brynn, OTR/L  Acute Rehabilitation Services Pager: [phone] Office: [phone] .   Mateo Flow 10/04/2020, 10:06 AM

## 2020-10-04 NOTE — Progress Notes (Signed)
NAME:  Walter [name], MRN:  [id number], DOB:  Nov 21, 1955, LOS: 9 ADMISSION DATE:  09/25/2020, CONSULTATION DATE:  09/25/20 REFERRING MD:  Wilford Corner,  CHIEF COMPLAINT:  Headache, r side weakness, slurred speech  History of Present Illness:  Walter [name] is a 65 yo man with hx of afib not on anticoagulation admitted with right-sided weakness, hemorrhagic stroke. Emergently intubated in the ED for airway protection  Pertinent  Medical History  Aflutter/afib (not on ac) S/p Ablation 03/2020, hx of Cardioversion 2015 Hx retinal detachment Covid infection in July 2021 treated with monoclonal antibody Hx CV   Significant Hospital Events: Including procedures, antibiotic start and stop dates in addition to other pertinent events   . 3/28- Admit, intubated.  Not a candidate for TPA due to intracranial hemorrhage.  Started 3% saline . 3/29- EVD placement for developing hydrocephalus. CTA of the head was unremarkable . 3/30- Extubated. . 4/1 respiratory culture grew Moraxella, on ceftriaxone . 4/4 reintubated because of hypoxia and unresponsiveness . 4/6 tracheostomy  Interim History / Subjective:  Patient continued to spike fever with T-max 101.4 Remains encephalopathic  Objective   Blood pressure 138/85, pulse (!) 121, temperature (!) 101.4 F (38.6 C), temperature source Axillary, resp. rate (!) 0, height 6\' 2"  (1.88 m), weight 93.2 kg, SpO2 99 %.    Vent Mode: PRVC FiO2 (%):  [40 %] 40 % Set Rate:  [20 bmp] 20 bmp Vt Set:  [650 mL] 650 mL PEEP:  [5 cmH20] 5 cmH20 Plateau Pressure:  [13 cmH20-19 cmH20] 16 cmH20   Intake/Output Summary (Last 24 hours) at 10/04/2020 1705 Last data filed at 10/04/2020 1525 Gross per 24 hour  Intake 2097.08 ml  Output 1650 ml  Net 447.08 ml   Filed Weights   09/30/20 0400 10/01/20 0400 10/02/20 0400  Weight: 102.6 kg 102.1 kg 93.2 kg    Examination: Gen:      Elderly Caucasian male, lying in the bed, orally intubated HEENT:  EOMI, sclera anicteric,  EVD in place, not draining well. Severely dry mucous membranes, ETT/EVD in place Neck:     No masses; no thyromegaly Lungs:    Coarse breath sounds at bases right more than left; no wheezes or rhonchi CV:         Regular rate and rhythm; no murmurs Abd:      + bowel sounds; soft, non-tender; no palpable masses, no distension Ext:    No edema; adequate peripheral perfusion Skin:      Warm and dry; no rash Neuro:   Eyes closed, opens with painful stimuli, positive cough gag and corneal reflexes.  Left side is antigravity   Labs/imaging that I havepersonally reviewed  (right click and "Reselect all SmartList Selections" daily)  Sodium 157 Tracheal aspirate is growing Moraxella  CT head 4/1: New intraventricular hemorrhage in the third and lateral ventricles. Fourth ventricular and midbrain hemorrhage is unchanged. 2. New right frontal EVD which traverses the right lateral ventricle. Blood clot encompasses the lower catheter and ventriculomegaly is similar to prior. 3. Small cortical infarcts are newly seen along the superior left frontal convexity  CT head 4/3: 1. Stable right superior frontal approach EVD with decreased volume of intraventricular hemorrhage and improved ventricular decompression from 2 days ago.  2. Small infarcts have developed in both the left superior frontal gyrus and the left cerebellum PICA territory since the MRI 09/25/2020, not significantly changed from 09/29/2020.  3. Left dorsal midbrain intra-axial hemorrhage has not significantly changed since presentation. Associated brainstem  edema.  4. Stable trace subarachnoid hemorrhage is stable and may be related to EVD placement.  CT head 4/4: 1. Interval decrease in intraventricular hemorrhage with decreased size of the lateral ventricles as compared to prior, right greater than left. Right parietal approach ventriculostomy in stable position with tip near the septum pellucidum. 2. Stable small volume  subarachnoid and intraparenchymal hemorrhage along the catheter tract. 3. Stable 1.9 cm hemorrhage at the left dorsal pons. 4. No other new acute intracranial abnormality  CT head 4/5: Ventricle size shows mild interval enlargement since yesterday. No significant hydrocephalus. Intraventricular hemorrhage stable  Right frontal ventricular drain unchanged. Hemorrhage surrounding the drain unchanged.  Subarachnoid hemorrhage unchanged. Hemorrhage in the left tectum unchanged. No new hemorrhage identified  Resolved Hospital Problem list     Assessment & Plan:  Acute midbrain/dorsal pons/thalamic intraparenchymal hemorrhage with IVH, ICH score of 2 Acute obstructive hydrocephalus s/p EVD EVD is not draining Repeat head CT showed improvement in hydrocephalus 3% saline is on hold with serum sodium 157 stroke team is following Continue secondary stroke prophylaxis  Acute encephalopathy due to ICH Mental status has not changed Avoid sedation Closely monitor mental status  Acute respiratory failure due to stroke Tracheostomy was performed today Continue lung protective ventilation Avoid sedation now  Community-acquired pneumonia with Moraxella and possible aspiration during intubation event Sputum culture grew Moraxella X-ray chest is suggestive of right lower lobe infiltrate Repeated sputum culture is negative Patient is spiked fever with T-max 102.1, white count is trending down, this fever could be central fever Continue Unasyn, adjust antibiotic based on respiratory culture  Paroxysmal atrial fibrillation Patient heart rate is well controlled he gets bradycardic Currently he is in sinus rhythm with a stable heart rate Not on anticoagulation as an outpatient Continue home diltiazem 30mg  q6h Discontinue digoxin  Acute kidney injury due to severe dehydration Serum creatinine improved 0.9 Monitor intake and output Repeat BMP in the morning  Best Practice (right click  and "Reselect all SmartList Selections" daily)   Diet:  Tube Feed  Pain/Anxiety/Delirium protocol (if indicated): RASS goal 0 VAP protocol (if indicated): Yes DVT prophylaxis: SCD GI prophylaxis: PPI Glucose control:  SSI Yes Central venous access:  N/A Arterial line:  N/A Foley:  N/A Mobility:  bed rest  PT consulted: Yes Code Status:  full code Prognosis: Guarded Last date of multidisciplinary goals of care discussion 52/5 Spoke with patient's daughter and wife, explained situation, they would like to proceed with tracheostomy and continue aggressive care   Total critical care time: 33 minutes  Performed by: 6/11   Critical care time was exclusive of separately billable procedures and treating other patients.   Critical care was necessary to treat or prevent imminent or life-threatening deterioration.   Critical care was time spent personally by me on the following activities: development of treatment plan with patient and/or surrogate as well as nursing, discussions with consultants, evaluation of patient's response to treatment, examination of patient, obtaining history from patient or surrogate, ordering and performing treatments and interventions, ordering and review of laboratory studies, ordering and review of radiographic studies, pulse oximetry and re-evaluation of patient's condition.   Cheri Fowler MD [facility] See Amion for pager If no response to pager, please call [phone] until 7pm After 7pm, Please call E-link [phone]

## 2020-10-04 NOTE — Progress Notes (Signed)
Patient's upper lip appeared to be very swollen on morning assessment. Was not sure if it was from malplaced ETT holder vs potential angioedema r/t keppra.  Janina Mayo was scheduled for 1130 so it was decided to reassess after ETT was removed.  Upper lip continues to swell.  Dr Diannia Ruder assess with me at 1740 and decided, because of swelling and lack of any seizure activity, we would discontinue keppra doses. An order for LR bolus was also given b/c of dark urine. Will continue to monitor patient. Markeis Allman C 5:49 PM

## 2020-10-04 NOTE — Progress Notes (Signed)
No output from EVD the entire shift. Neuro status has remained the same. Will continue to monitor.

## 2020-10-04 NOTE — Progress Notes (Signed)
Date and time results received: 10/04/20 0735 (use smartphrase ".now" to insert current time)  Test: CMET Critical Value: Na 171, Cl >130  Name of Provider Notified: Dr Merrily Pew  Orders Received? Or Actions Taken?: Orders Received - See Orders for details

## 2020-10-04 NOTE — Procedures (Signed)
Patient Name: Walter [name]  MRN: [id number]  Epilepsy Attending: [name]  Referring Physician/Provider: Dr Brooke Dare Duration: 10/03/2020 2139 to 10/04/2020 0730  Patient history: 28 old male with left upper extremity shaking.  EEG done for seizures.  Level of alertness:  lethargic   AEDs during EEG study: Keppra  Technical aspects: This EEG study was done with scalp electrodes positioned according to the 10-20 International system of electrode placement. Electrical activity was acquired at a sampling rate of 500Hz  and reviewed with a high frequency filter of 70Hz  and a low frequency filter of 1Hz . EEG data were recorded continuously and digitally stored.   Description: No clear posterior dominant rhythm was seen. EEG showed continuous generalized 3 to 6 Hz theta-delta slowing.  Intermittent generalized periodic discharges with triphasic morphology were noted at 1.5 to 2.5 Hz.  Hyperventilation and photic stimulation were not performed.     ABNORMALITY -Continuous slow, generalized -Periodic discharges with triphasic morphology, generalized  IMPRESSION: This study showed periodic discharges with triphasic morphology at 1.5 to 2.5 Hz which is on the ictal-interictal continuum with low to intermediate potential for seizures.  Additionally there is evidence of moderate to severe diffuse encephalopathy, nonspecific etiology.  No seizures were seen during the study.  [name] 

## 2020-10-04 NOTE — Procedures (Signed)
Percutaneous Tracheostomy Procedure Note   VALERIANO BAIN  [id number]  04/29/1956  Date:10/04/20  Time:12:02 PM   Provider Performing:Trevione Wert  Procedure: Percutaneous Tracheostomy with Bronchoscopic Guidance (17711)  Indication(s) Acute respiratory failure  Consent Risks of the procedure as well as the alternatives and risks of each were explained to the patient and/or caregiver.  Consent for the procedure was obtained.  Anesthesia Etomidate, Versed, Fentanyl, Vecuronium   Time Out Verified patient identification, verified procedure, site/side was marked, verified correct patient position, special equipment/implants available, medications/allergies/relevant history reviewed, required imaging and test results available.   Sterile Technique Maximal sterile technique including sterile barrier drape, hand hygiene, sterile gown, sterile gloves, mask, hair covering.    Procedure Description Appropriate anatomy identified by palpation.  Patient's neck prepped and draped in sterile fashion.  1% lidocaine with epinephrine was used to anesthetize skin overlying neck.  1.5cm incision made and blunt dissection performed until tracheal rings could be easily palpated.   Then a size 6 Shiley tracheostomy was placed under bronchoscopic visualization using usual Seldinger technique and serial dilation.   Bronchoscope confirmed placement above the carina.  Tracheostomy was sutured in place with adhesive pad to protect skin under pressure.    Patient connected to ventilator.   Complications/Tolerance None; patient tolerated the procedure well. Chest X-ray is ordered to confirm no post-procedural complication.   EBL Minimal   Specimen(s) None

## 2020-10-04 NOTE — Progress Notes (Signed)
SLP Cancellation Note  Patient Details Name: Walter [name] MRN: [id number] DOB: 1956/03/22   Cancelled treatment:       Reason Eval/Treat Not Completed: Patient not medically ready. Patient with new tracheostomy. Orders for SLP eval and treat for PMSV and swallowing received. Will follow pt closely for readiness for SLP interventions as appropriate.     Tenleigh Byer, Riley Nearing 10/04/2020, 12:47 PM

## 2020-10-04 NOTE — Progress Notes (Signed)
eLink Physician-Brief Progress Note Patient Name: Walter [name] DOB: 1955-12-29 MRN: [id number]   Date of Service  10/04/2020  HPI/Events of Note  Fever to 102.4 F - Now on cooling blanket. Patient also on Unasyn for Moraxella Catarrhalis in sputum from 09/27/2000. Repeat sputum gram stain from 4/582022 is negative for organisms. Culture still pending.   eICU Interventions  Plan: 1. Continue present management.  2. Defer repeat cultures to PCCM rounding team in AM.      Intervention Category Major Interventions: Other:  Chelcee Korpi,Chen Dennard Nip 10/04/2020, 3:30 AM

## 2020-10-04 NOTE — Progress Notes (Signed)
LTM maintenance completed; no skin breakdown was seen. 

## 2020-10-04 NOTE — Progress Notes (Signed)
PT Cancellation Note  Patient Details Name: Walter [name] MRN: [id number] DOB: 06/21/1956   Cancelled Treatment:    Reason Eval/Treat Not Completed: Patient's level of consciousness;Medical issues which prohibited therapy. RN reporting pt's EVD was clogged this morning. Pt s/p trach today with low level of arousal. OT notified PT that pt did not demonstrate any purposeful movement during their session and pt with very low level of consciousness this date. Will follow-up another day as appropriate.   Raymond Gurney, PT, DPT Acute Rehabilitation Services  Pager: [phone] Office: [phone]    Jewel Baize 10/04/2020, 4:12 PM

## 2020-10-04 NOTE — Progress Notes (Signed)
OT NOTE  RN STAFF  Please check splint every 4 hours during shift ( remove splint ) to assess for: * pain * redness *swelling  If any symptoms above present remove splint for 15 minutes. If symptoms continue - keep the splint removed and notify OT staff [phone] immediately.   Keep the UE elevated at all times on pillows / towels.  Splint should have two splint covers (blue cloth) with a mesh bag for cleaning. The splint cover (blue cloth) should be washed with soapy water and hung out to dry or washed on delicate in home washer. Do not throw away splint cover it is washable. Please have a set location to store the splints in patients room for daily application.    Splints are to be worn for 4 hours and off for 2 hours  Examples of schedule:  Splints off at 6am Splints on at 8 am Splints off at 12 pm Splints on at 2 pm Splints off at 6pm Splints on at 8 pm    To place the splint on:  1. Place the wrist in position first and secure strap 2. Position each digit and apply strap 3. The thumb and forearm strap should be applied last   The splints should fit as appeared here Strap over the PIP joint of finger Strap over the MCP ( knuckles)  joints of the hand Strap at the thumb Strap at the wrist Strap at the forearm   Brynn, OTR/L  Acute Rehabilitation Services Pager: [phone] Office: [phone] .  

## 2020-10-04 NOTE — Progress Notes (Signed)
Orthopedic Tech Progress Note Patient Details:  Walter [name] 1956/05/27 [id number] Called in order to [facility] for resting hang splints Patient ID: Walter [name], male   DOB: [birth year], 65 y.o.   MRN: [id number]   Walter [name] 10/04/2020, 11:16 AM

## 2020-10-04 NOTE — Progress Notes (Signed)
Patient ID: Walter [name], male   DOB: 05/24/1956, 65 y.o.   MRN: [id number] Events of last evening noted Activity very suspect for seizure Patient was given a loading dose of Keppra He remains febrile and is to have a tracheotomy placed today CT scan last night demonstrates the ventricles appear normal size IVC has not drained anything now nearly 24 hours At this point I believe we should observe the patient with the IVC in place If the subsequent CT demonstrates ventricular enlargement then we can administer TPA to see if the drain will return output He may start to work spontaneously also Patient may be passed his hydrocephalus at this point as there is some resolution of the hemorrhage in the midbrain.  We will continue to observe closely I will be out of town for the next couple of days and I have asked Dr. Kendell Bane Dawley to see the patient in my absence.  Situation was discussed with the patient's nurse and Dr. Roda Shutters this morning.

## 2020-10-05 ENCOUNTER — Inpatient Hospital Stay (HOSPITAL_COMMUNITY): Payer: Medicare Other

## 2020-10-05 DIAGNOSIS — I613 Nontraumatic intracerebral hemorrhage in brain stem: Secondary | ICD-10-CM | POA: Diagnosis not present

## 2020-10-05 DIAGNOSIS — G9340 Encephalopathy, unspecified: Secondary | ICD-10-CM | POA: Diagnosis not present

## 2020-10-05 DIAGNOSIS — I629 Nontraumatic intracranial hemorrhage, unspecified: Secondary | ICD-10-CM | POA: Diagnosis not present

## 2020-10-05 DIAGNOSIS — J9602 Acute respiratory failure with hypercapnia: Secondary | ICD-10-CM | POA: Diagnosis not present

## 2020-10-05 DIAGNOSIS — I615 Nontraumatic intracerebral hemorrhage, intraventricular: Secondary | ICD-10-CM | POA: Diagnosis not present

## 2020-10-05 LAB — BASIC METABOLIC PANEL
Anion gap: 8 (ref 5–15)
BUN: 22 mg/dL (ref 8–23)
CO2: 26 mmol/L (ref 22–32)
Calcium: 8.2 mg/dL — ABNORMAL LOW (ref 8.9–10.3)
Chloride: 123 mmol/L — ABNORMAL HIGH (ref 98–111)
Creatinine, Ser: 0.97 mg/dL (ref 0.61–1.24)
GFR, Estimated: >60 mL/min (ref >60)
Glucose, Bld: 208 mg/dL — ABNORMAL HIGH (ref 70–99)
Potassium: 3.4 mmol/L — ABNORMAL LOW (ref 3.5–5.1)
Sodium: 157 mmol/L — ABNORMAL HIGH (ref 135–145)

## 2020-10-05 LAB — SODIUM
Sodium: 157 mmol/L — ABNORMAL HIGH (ref 135–145)
Sodium: 156 mmol/L — ABNORMAL HIGH (ref 135–145)
Sodium: 154 mmol/L — ABNORMAL HIGH (ref 135–145)

## 2020-10-05 LAB — CULTURE, RESPIRATORY W GRAM STAIN: Culture: NORMAL

## 2020-10-05 LAB — MAGNESIUM: Magnesium: 2.3 mg/dL (ref 1.7–2.4)

## 2020-10-05 LAB — CBC
HCT: 36.7 % — ABNORMAL LOW (ref 39.0–52.0)
Hemoglobin: 11.5 g/dL — ABNORMAL LOW (ref 13.0–17.0)
MCH: 31.4 pg (ref 26.0–34.0)
MCHC: 31.3 g/dL (ref 30.0–36.0)
MCV: 100.3 fL — ABNORMAL HIGH (ref 80.0–100.0)
Platelets: 129 10*3/uL — ABNORMAL LOW (ref 150–400)
RBC: 3.66 MIL/uL — ABNORMAL LOW (ref 4.22–5.81)
RDW: 14.4 % (ref 11.5–15.5)
WBC: 10.6 10*3/uL — ABNORMAL HIGH (ref 4.0–10.5)
nRBC: 0 % (ref 0.0–0.2)

## 2020-10-05 LAB — GLUCOSE, CAPILLARY
Glucose-Capillary: 169 mg/dL — ABNORMAL HIGH (ref 70–99)
Glucose-Capillary: 201 mg/dL — ABNORMAL HIGH (ref 70–99)
Glucose-Capillary: 183 mg/dL — ABNORMAL HIGH (ref 70–99)
Glucose-Capillary: 192 mg/dL — ABNORMAL HIGH (ref 70–99)
Glucose-Capillary: 204 mg/dL — ABNORMAL HIGH (ref 70–99)
Glucose-Capillary: 219 mg/dL — ABNORMAL HIGH (ref 70–99)

## 2020-10-05 MED ORDER — "THROMBI-PAD 3""X3"" EX PADS"
1.0000 | MEDICATED_PAD | Freq: Once | CUTANEOUS | Status: AC
  Administered 2020-10-05: 1 via TOPICAL
  Filled 2020-10-05: qty 1

## 2020-10-05 MED ORDER — FREE WATER
100.0000 mL | Status: DC
  Administered 2020-10-05 – 2020-10-06 (×6): 100 mL

## 2020-10-05 MED ORDER — ALTEPLASE 2 MG IJ SOLR: 2.0000 mg | Freq: Once | INTRAMUSCULAR | Status: DC

## 2020-10-05 MED ORDER — POTASSIUM CHLORIDE 20 MEQ PO PACK
40.0000 meq | PACK | Freq: Once | ORAL | Status: AC
  Administered 2020-10-05: 40 meq

## 2020-10-05 NOTE — Procedures (Addendum)
Patient Name: Walter [name]  MRN: [id number]  Epilepsy Attending: [name]  Referring Physician/Provider: Dr Brooke Dare Duration: 10/04/2020 0730 to 10/05/2020 0730  Patient history: 69 old male with left upper extremity shaking.  EEG done for seizures.  Level of alertness:  comatose  AEDs during EEG study: Vimpat  Technical aspects: This EEG study was done with scalp electrodes positioned according to the 10-20 International system of electrode placement. Electrical activity was acquired at a sampling rate of 500Hz  and reviewed with a high frequency filter of 70Hz  and a low frequency filter of 1Hz . EEG data were recorded continuously and digitally stored.   Description: No clear posterior dominant rhythm was seen. EEG showed continuous generalized 3 to 6 Hz theta-delta slowing. Intermittent generalized rhythmic  periodic discharges with triphasic morphology were noted at 1.5 to 2.5 Hz pedominantly during patient arousal, coughing.  Hyperventilation and photic stimulation were not performed.  Event button was pressed on 10/04/2020 for left upper extremity twitching. Concomitant eeg showed generalized periodic discharges with triphasic morphology were noted at 1.5 to 2.5 Hz.   ABNORMALITY -Continuous slow, generalized - Stimulation induced rhythmic periodic ictal-interictal discharges ( SIRPIDS), generalized  IMPRESSION:  This study showed stimulation induced rhythmic periodic ictal-interictal discharges ( SIRPIDS) at 1.5 to 2.5 Hz which is on the ictal-interictal continuum with low to intermediate potential for seizures.  Additionally there is evidence of moderate to severe diffuse encephalopathy, nonspecific etiology.    Multiple events of left upper extremity twitching were noted. Concomitant eeg showed generalized periodic discharges with triphasic morphology at 1.5 to 2.5 Hz without definite evolution. These could be focal motor seizures. However, the semiology and that  these were seen predominantly after stimulation along with lack of evolution on eeg and no eeg changes when resting raises suspicion for stimulation induced myoclonus. Clinical correlation is recommended.   [name] 

## 2020-10-05 NOTE — Progress Notes (Signed)
K+ 3.4 Replaced per protocol  

## 2020-10-05 NOTE — Progress Notes (Addendum)
STROKE TEAM PROGRESS NOTE  Significant Events: 3/29 s/p EVD 3/30 extubated  4/4 Required re-intubation for resp failure with hypoxia and unresponsiveness. Repeat CT showed clearance of IVH at third ventricle and upper lateral ventricles. Decompressed right lateral ventricle.  4/5 Seizure activity suspected, repeat HCT stable, Keppra started. IVC catheter drainage stopped and was flushed 4/6 Keppra changed to Vimpat d/t possible allergy (?lip angioedema). Afib with RVR overnight. Febrile to 102.4. .   INTERVAL HISTORY Afib with RVR overnight. Febrile again to 102.4.   Remains minimally responsive off sedation. LTM continued with generalized SIRPIDS as the working read currently-not finalized Neurosurgery to advise regarding IVC removal.  Repeat HCT stable with slight improvement in IVH and left midbrain hemorrhage  Dr. Roda Shutters discussed recent diagnostic findings including updated head CT, EEG and current plan of care with wife and daughter at bedside. Questions were addressed.   Vitals:   10/05/20 0500 10/05/20 0510 10/05/20 0600 10/05/20 0700  BP: (!) 152/99  (!) 144/77 139/89  Pulse: 100  (!) 105 90  Resp: 18  16 13   Temp:  99.7 F (37.6 C)    TempSrc:  Oral    SpO2: 100%  99% 100%  Weight:      Height:       CBC:  Recent Labs  Lab 10/03/20 2035 10/04/20 0602 10/05/20 0412  WBC 9.7 10.3 10.6*  NEUTROABS 7.8*  --   --   HGB 11.5* 11.2* 11.5*  HCT 36.1* 35.9* 36.7*  MCV 101.1* 100.6* 100.3*  PLT 125* 121* 129*   Basic Metabolic Panel:  Recent Labs  Lab 10/03/20 2035 10/04/20 0602 10/04/20 0748 10/04/20 1520 10/04/20 2216 10/05/20 0412  NA 154*   < > 157*   < > 158* 157*  K 3.9   < > 4.2  --   --  3.4*  CL 127*   < > 128*  --   --  123*  CO2 28   < > 22  --   --  26  GLUCOSE 190*   < > 211*  --   --  208*  BUN 27*   < > 28*  --   --  22  CREATININE 1.08   < > 0.99  --   --  0.97  CALCIUM 8.0*   < > 8.5*  --   --  8.2*  MG 2.5*  --   --   --   --  2.3  PHOS 2.9   --   --   --   --   --    < > = values in this interval not displayed.   Lipid Panel:  No results for input(s): CHOL, TRIG, HDL, CHOLHDL, VLDL, LDLCALC in the last 168 hours. HgbA1c:  No results for input(s): HGBA1C in the last 168 hours. Urine Drug Screen:  No results for input(s): LABOPIA, COCAINSCRNUR, LABBENZ, AMPHETMU, THCU, LABBARB in the last 168 hours.  Alcohol Level  No results for input(s): ETH in the last 168 hours.  IMAGING  CT head 4/6 1. Ventricle size shows mild interval enlargement since yesterday. No significant hydrocephalus. Intraventricular hemorrhage stable 2. Right frontal ventricular drain unchanged. Hemorrhage surrounding the drain unchanged. 3. Subarachnoid hemorrhage unchanged. Hemorrhage in the left tectum unchanged. No new hemorrhage identified.  CT head 4/4 1. Interval decrease in intraventricular hemorrhage with decreased size of the lateral ventricles as compared to prior, right greater than left. Right parietal approach ventriculostomy in stable position with tip near the septum  pellucidum. 2. Stable small volume subarachnoid and intraparenchymal hemorrhage along the catheter tract. 3. Stable 1.9 cm hemorrhage at the left dorsal pons. 4. No other new acute intracranial abnormality. 09/29/2020  CT head 4/3 IMPRESSION: 1. New intraventricular hemorrhage in the third and lateral ventricles. Fourth ventricular and midbrain hemorrhage is unchanged. 2. New right frontal EVD which traverses the right lateral ventricle. Blood clot encompasses the lower catheter and ventriculomegaly is similar to prior. 3. Small cortical infarcts are newly seen along the superior left frontal convexity.  09/26/2020 IR Angio IMPRESSION: 1. No evidence of an AVM, dural arteriovenous fistula, aneurysm or other vascular abnormality to explain patient's brainstem hemorrhage.  09/26/2020 CT head:  IMPRESSION: 1. Unchanged left brainstem hemorrhage extending into the  fourth ventricle. 2. Worsening hydrocephalus.  09/25/2020 MRI brain w/wo, MR Venogram IMPRESSION: 1. Acute hemorrhage centered within the left dorsal midbrain extending into the dorsal pons and inferomedial thalamus with intraventricular extension. No abnormal enhancement to suggest underlying lesion. 2. Compression of the cerebral aqueduct with possible mild obstructive hydrocephalus. 3. No evidence of dural sinus thrombosis. Attenuation of the left basal vein in the quadrigeminal cistern without definite occlusion.  09/25/2020 CT Angio head/neck IMPRESSION: 1. No vascular malformation, anomaly or aneurysm. 2. No emergent large vessel occlusion or high-grade stenosis of the intracranial arteries. 3. Unchanged size of dorsal left pontine hemorrhage with intraventricular extension.  09/25/2020 CT head code stroke IMPRESSION: Acute intraparenchymal hemorrhage in the dorsal left pons with subarachnoid extension into the cerebral aqueduct and fourth Ventricle.  EEG-LTM 4/5-4/6  This study showed periodic discharges with triphasic morphology at 1.5 to 2.5 Hz which is on the ictal-interictal continuum with low to intermediate potential for seizures.  Additionally there is evidence of moderate to severe diffuse encephalopathy, nonspecific etiology.  No seizures were seen during the study  PHYSICAL EXAM Blood pressure 139/89, pulse 90, temperature 99.7 F (37.6 C), temperature source Oral, resp. rate 13, height 6\' 2"  (1.88 m), weight 93.2 kg, SpO2 100 %.  General: Elderly caucasian male, critically ill, intubated orally. Sedated.  Right frontal ventriculostomy catheter in place. Angio edema of upper lip improving.  Lungs: Symmetrical Chest rise, no labored breathing Cardio: Irregular Abdomen: Soft, non-tender  Neurological Exam :  Intubated, sedated. Keeps eyes closed.  Eyes in mid position. PERRL. Positive doll's eyes,  Minimal corneal responsiveness. No blink to threat. Facial  symmetry assessment impaired by oral intubation. + cough during head turning  Motor: Spontaneously moving left upper and lower extremity without evident purpose and  not in response to commands or stimuli.  No withdrawal to noxious stimuli right hemibody.  Tone and bulk:normal tone throughout; no atrophy noted  ASSESSMENT/PLAN Mr. SHEA KAPUR is a 65 y.o. male with history of atrial flutter newly diagnosed with atrial fibrillation in February 2022 not on anticoagulation, follows with Dr. March 2022 in cardiology.  He presented with right-sided weakness, slurred speech, headache, and vomiting.Last known well when he went to bed at 10 PM on 09/24/2020 and woke up at around 2 AM on the day of admission with slurred speech.  EMS was called.He was very drowsy upon their assessment and notable right side weakeness.  He had worsening mental status, GCS 5, so was emergently intubated in ED for airway protection.  His MRI brain showed acute hemorrhage at left midbrain extending into pons with IVH associated with obstructive hydrocephalus.    ICH - Left Dorsalmidbrain ICHextending into dorsal pons and inferior thalamus with mild mass-effect on third ventricle and hydrocephalus,source  unknown, suspect occult cavernoma  Code Stroke CT head-Acute IPH in the dorsal left pons with subarachnoid extension into the cerebral aqueduct and fourth ventricle.  CTA head & neck-No vascular malformation, anomaly or aneurysm. No emergent large vessel occlusion or high-grade stenosis. Unchanged size of dorsal left pontine hemorrhage with intraventricular extension.  MRI& MR Venogram-Acute hemorrhage centered within the left dorsal midbrain extending into the dorsal pons and inferomedial thalamus with intraventricular extension. Compression of the cerebral aqueduct with possible mild obstructive hydrocephalus. No evidence of dural sinus thrombosis.   CT Head W/O Contrast 3/29 0250 -Unchanged left brainstem  hemorrhage extending into the fourth ventricle. Worsening hydrocephalus.  Diagnostic Cerebral Angiogram - No evidence of an aneurysm, AVM, dural AV fistula or other vascular abnormality to explain patient's known intraparenchymal hemorrhage.  2D Echo- EF:55-60%. No wall motion abnormalities.  LDL- 88  HgbA1c5.7  VTE prophylaxis -subcu heparin  No antithromboticprior to admission, now on Aspirin 81 mg. Follow up in clinic regarding further use of antiplatelet vs. anticoagulation.  Therapy recommendations:CIR  Disposition:Pending  Obstructive hydrocephalus   EVD placed on 3/30   CT 4/1 showed clot in right lateral ventricle around catheter.   S/p 1mg  (1cc) alteplase placed in EVD 09/29/20 -> drain well ->ICP measuring 4-11 with a good waveform  CT head (4/3) resolution of most of the IVH and improved hydrocephalus.  EVD stopped working 4/2 overnight. 0.5mg  alteplase placed in EVD 10/01/2020 -> Post alteplase CT head new hemorrhage along the EVD tract an in the right lateral ventricle.  CT repeat 4/4 - decreased IVH and decompressed right lateral ventricle  CT repeat 4/5 -mild interval enlargement of ventricular size, no significant hydrocephalus.  IVH stable.  HCT 4/7 stable, with no appreciable change in overall ventricular size  HCT repeat in am  Cerebral Edema  PICC line placement 4/3 for hypertonic saline solution  Off 3% saline now -> Free water added: 6/3 q 4 hours  Na goal is 150-155  Sodium level 147->148->146->148->145->152->155->157->156  Follow sodium q6hr  Acute Respiratory Failure  Required intubation on 4/4 due to worsening MS and hypoxia  S/p trach 10/04/20  Remains on mech vent  Off precedex  CCM managing  Aspiration Pneumonia Community-acquired pneumonia with Moraxella Fever   Tmax 101.1->102.4  Sputum culture (3/30): Moraxella  Sputum culture 4/5: neg  Ceftriaxone  dc'ed 4/1, now on Unasyn 3g q6hr  WBC  10.7->16.3->11.0->9.7->10.3->10.6  CCM following  AKI, resolved  Creatinine 0.74 -> 1.81->1.14->1.08->0.97  On FW and tube feeding  CCM on board  Close monitoring  SIRPIDs Encephalopathy   Left UE intermittent jerking with stimulation  LTM EEG: GPEDs with triphasic wave, generalized SIRPIDs  S/p keppra 10/03/20 -> d/c concerning for angioedema   Now on vimpat 10/04/20  EEG also showed moderate to severe diffuse encephalopathy - likely related to fever, pneumonia   Angioedema   Upper lip edema  Concerning for keppra allergy  DDx including the ET tube biting on the upper lip  Now s/p trach - continue to monitor  D/c keppra - switched to vimpat  Hypertension  Home meds:Diltiazem  Currently on Diltiazem 30 mg q6  BP goal Systolic < 160  Long-term BP goal normotensive  Aflutter and Afib  Not on [facility] before admission  S/p CTI ablation for A flutter  Currently on ASA  Not AC candidate at this time.  Afib with RVR overnight 4/6  Follow up in clinic regarding further use of antiplatelet vs. anticoagulation.  Hyperlipidemia  Home meds:none  LDL88,  goal < 70  Addstatin once hemorrhage is stable  Continue statin at discharge  Dysphagia  Secondary to hemorrhagic stroke  NPO  SLP on board  Now with neurologic decline will likely require PEG  Tube feeds @ 9ml/hr and FW 100 Q4  Other Stroke Risk Factors  Advanced Age >/= 74  Snoring: sleep study denied by insurance prior to admission   Other Active Problems:  Hypokalemia  This plan of care was discussed with an directed by Dr. Roda Shutters.   Janey Genta, NP-C   ATTENDING NOTE: I reviewed above note and agree with the assessment and plan. Pt was seen and examined.   Wife and daughter are at bedside.  Patient had tracheostomy yesterday, still on ventilation.  Eyes closed, not open on voice or pain stimulation, however able to grimace and spontaneous left upper extremity  movement with pain stimulation.  Do not follow commands, nonverbal.  PERRL, not blinking to visual threat, still has upper lip angioedema, no significant facial asymmetry.  Bilateral upper extremity flaccid on exam, however able to spontaneous movement with pain.  Letter lower extremity no movement with pain stimulation.  No Babinski.   Overnight still have fever T-max 102.4.  Currently on Unasyn.  CCM on board.  Repeat CT this morning showed stable ventricular size, decreased blood product at left midbrain and overall IVH.  Neurosurgery Dr. Jake Samples on board, recommend to maintain EVD for another day and repeat image in a.m.  Although neuroimaging reassuring, patient mental status still no significant change from yesterday before tracheostomy.  Etiology for patient continued AMS likely due to encephalopathy with fever, pneumonia.  Will need to continue monitoring. However, given his mismatch of his mental status with CT findings, as well as his ongoing afib and aflutter not on [facility], will repeat MRI brain in am.   For detailed assessment and plan, please refer to above as I have made changes wherever appropriate.   This patient is critically ill due to ICH, IVH, seizure-like activity, mental status change, hydrocephalus, respiratory failure and at significant risk of neurological worsening, death form hematoma expansion, worsening IVH, recurrent stroke, heart failure, seizure, status epilepticus, obstructive hydrocephalus and sepsis. This patient's care requires constant monitoring of vital signs, hemodynamics, respiratory and cardiac monitoring, review of multiple databases, neurological assessment, discussion with family, other specialists and medical decision making of high complexity. I spent 45 minutes of neurocritical care time in the care of this patient. I had long discussion with daughter and wife at bedside, updated pt current condition, treatment plan and potential prognosis, and answered all the  questions.  They expressed understanding and appreciation.  I also discussed with Dr. Merrily Pew CCM and Dr. Jake Samples neurosurgery.   Marvel Plan, MD PhD Stroke Neurology 10/05/2020 5:28 PM

## 2020-10-05 NOTE — Progress Notes (Addendum)
NAME:  Walter [name], MRN:  [id number], DOB:  06-Feb-1956, LOS: 10 ADMISSION DATE:  09/25/2020, CONSULTATION DATE:  09/25/20 REFERRING MD:  Wilford Corner,  CHIEF COMPLAINT:  Headache, r side weakness, slurred speech  History of Present Illness:  Walter [name] is a 65 yo man with hx of afib not on anticoagulation admitted with right-sided weakness, hemorrhagic stroke. Emergently intubated in the ED for airway protection  Pertinent  Medical History  Aflutter/afib (not on ac) S/p Ablation 03/2020, hx of Cardioversion 2015 Hx retinal detachment Covid infection in July 2021 treated with monoclonal antibody Hx CV   Significant Hospital Events: Including procedures, antibiotic start and stop dates in addition to other pertinent events   . 3/28- Admit, intubated.  Not a candidate for TPA due to intracranial hemorrhage.  Started 3% saline . 3/29- EVD placement for developing hydrocephalus. CTA of the head was unremarkable . 3/30- Extubated. . 4/1 respiratory culture grew Moraxella, on ceftriaxone . 4/4 reintubated because of hypoxia and unresponsiveness . 4/6 tracheostomy . 4/7 still encephalopathic   Interim History / Subjective:   Some oozing from trach site Encephalopathic   Put on PSV and was apneic  Then tachypneic -- put back on PRVC   PICC is kinked   Objective   Blood pressure 139/89, pulse 90, temperature 99.7 F (37.6 C), temperature source Oral, resp. rate 13, height 6\' 2"  (1.88 m), weight 93.2 kg, SpO2 100 %.    Vent Mode: PRVC FiO2 (%):  [40 %] 40 % Set Rate:  [20 bmp] 20 bmp Vt Set:  [650 mL] 650 mL PEEP:  [5 cmH20] 5 cmH20 Plateau Pressure:  [13 cmH20-19 cmH20] 16 cmH20   Intake/Output Summary (Last 24 hours) at 10/05/2020 0713 Last data filed at 10/05/2020 0700 Gross per 24 hour  Intake 2749.18 ml  Output 1990 ml  Net 759.18 ml   Filed Weights   09/30/20 0400 10/01/20 0400 10/02/20 0400  Weight: 102.6 kg 102.1 kg 93.2 kg    Examination: Gen:     Ill appearing older  adult M trach/vent, NAD  HEENT:  EVD in place not draining much. Anicteric sclera. Trach secure, some oozing around stoma.  Lungs:    Coarse MV sounds,  Mechanically ventilated. Symmetrical chest expansion  CV:       s1s2 no rgm cap refill brisk  Abd:     Soft round nd. + bowel sounds  Ext:   No acute joint deformity no cyanosis or clubbing. Toenail fungus.  Skin:     Warm no rash, clean Neuro:   L side antigravity. PERRL. + gag, cough, corneals   Labs/imaging that I havepersonally reviewed  (right click and "Reselect all SmartList Selections" daily)   CT head 4/1: New intraventricular hemorrhage in the third and lateral ventricles. Fourth ventricular and midbrain hemorrhage is unchanged. 2. New right frontal EVD which traverses the right lateral ventricle. Blood clot encompasses the lower catheter and ventriculomegaly is similar to prior. 3. Small cortical infarcts are newly seen along the superior left frontal convexity  CT head 4/3: 1. Stable right superior frontal approach EVD with decreased volume of intraventricular hemorrhage and improved ventricular decompression from 2 days ago.  2. Small infarcts have developed in both the left superior frontal gyrus and the left cerebellum PICA territory since the MRI 09/25/2020, not significantly changed from 09/29/2020.  3. Left dorsal midbrain intra-axial hemorrhage has not significantly changed since presentation. Associated brainstem edema.  4. Stable trace subarachnoid hemorrhage is stable and may be  related to EVD placement.  CT head 4/4: 1. Interval decrease in intraventricular hemorrhage with decreased size of the lateral ventricles as compared to prior, right greater than left. Right parietal approach ventriculostomy in stable position with tip near the septum pellucidum. 2. Stable small volume subarachnoid and intraparenchymal hemorrhage along the catheter tract. 3. Stable 1.9 cm hemorrhage at the left dorsal pons. 4.  No other new acute intracranial abnormality  CT head 4/5: Ventricle size shows mild interval enlargement since yesterday. No significant hydrocephalus. Intraventricular hemorrhage stable Right frontal ventricular drain unchanged. Hemorrhage surrounding the drain unchanged. Subarachnoid hemorrhage unchanged. Hemorrhage in the left tectum unchanged. No new hemorrhage identified  4/7 -- BMP, CBC: NA 157 K 3.4 WBC 10.6 plt 129   Resolved Hospital Problem list   AKI   Assessment & Plan:   Acute midbrain/dorsal pons/ thalamic IPH  -ICH score 2  Acute obstructive hydrocephalus s/p EVD  Possible seizure -?focal motor vs stimulation induced myoclonus  P EVD isnt draining much -- defer to NSGY but wonder if this might be able to be removed soon?  Off 3%  Neuro primary  EEG  Vimpat (did not tolerate keppra, possible angioedema)    Acute encephalopathy -ICH, ? Metabolic component, ?medication side effect (though not on continuous sedation)  P Limit sedation Trend neuro exam  Acute respiratory failure S/p tracheostomy  P Routine trach care VAP PAD -- no continuous sedation Thrombi pad for trach ooze   Afib - currently sinus P Electrolyte optimization  Cont home dilt  ICU monitoring   Hypernatremia  -was on 3%  Hypokalemia, mild  P FWF K replacement Trend BMP  Thrombocytopenia, mild  Trend     PICC malfunction  -(kinked) P Will try to get 2 PIVs, if able then dc PICC   Urinary retention P Urecholine Trial of dc foley   Best Practice (right click and "Reselect all SmartList Selections" daily)   Diet:  Tube Feed  Pain/Anxiety/Delirium protocol (if indicated): RASS goal 0 VAP protocol (if indicated): Yes DVT prophylaxis: SCD GI prophylaxis: PPI Glucose control:  SSI Yes Central venous access:  Yes, and it is no longer needed if able to get PIVs  Arterial line:  N/A Foley:  Yes, and it is no longer needed-- will trial dc 4/7  Mobility:  bed rest  PT  consulted: Yes Code Status:  full code Prognosis: Guarded Last date of multidisciplinary goals of care discussion 63/5 Spoke with patient's daughter and wife, explained situation, they would like to proceed with tracheostomy and continue aggressive care   CRITICAL CARE Performed by: Lanier Clam  Total critical care time: 42 minutes  Critical care time was exclusive of separately billable procedures and treating other patients. Critical care was necessary to treat or prevent imminent or life-threatening deterioration.  Critical care was time spent personally by me on the following activities: development of treatment plan with patient and/or surrogate as well as nursing, discussions with consultants, evaluation of patient's response to treatment, examination of patient, obtaining history from patient or surrogate, ordering and performing treatments and interventions, ordering and review of laboratory studies, ordering and review of radiographic studies, pulse oximetry and re-evaluation of patient's condition.  [name] MSN, AGACNP-BC [facility] [phone] If no answer, [phone] 10/05/2020, 7:14 AM

## 2020-10-05 NOTE — Progress Notes (Signed)
eLink Physician-Brief Progress Note Patient Name: Walter [name] DOB: 1955/12/12 MRN: [id number]   Date of Service  10/05/2020  HPI/Events of Note  Patient with trach today and has been oozing from trach site all day. Now finally slowing down. Nurse wants to know if she should hold prophylactic Heparin [place] dose tonight.  eICU Interventions  Plan: 1. Hold Heparin [place] dose tonight.      Intervention Category Major Interventions: Other:  [place], Degrace 10/05/2020, 9:20 PM

## 2020-10-05 NOTE — Progress Notes (Signed)
LTM maint complete - no skin breakdown under:  FZ,P8,F8,GND

## 2020-10-05 NOTE — Progress Notes (Signed)
VAST consulted to assess DL PICC that won't draw back or flush. Troubleshooting completed without success. PICC nurse reviewed chest x-ray from this am which showed large kink in PICC. Will attempt IV access X2 and if successful will dc PICC.

## 2020-10-05 NOTE — Progress Notes (Signed)
eLink Physician-Brief Progress Note Patient Name: Walter [name] DOB: 10-24-1955 MRN: [id number]   Date of Service  10/05/2020  HPI/Events of Note  Episode of AFIB with ventricular rate = 150. Ventricular rate now = 80-100.   eICU Interventions  Plan: 1. BMP and Mg++ level at 5 AM.     Intervention Category Major Interventions: Arrhythmia - evaluation and management  Awanda Wilcock,Thales [place] 10/05/2020, 12:15 AM

## 2020-10-05 NOTE — Progress Notes (Signed)
Bleeding from trach site beginning to clot, spoke with Dr. Arsenio Loader it is ok to hold the subQ heparin tonight, and reassess tomorrow morning once bleeding has stopped.

## 2020-10-05 NOTE — Progress Notes (Signed)
   Providing Compassionate, Quality Care - Together  NEUROSURGERY PROGRESS NOTE   S: No issues overnight. Remains encephalopathic  O: EXAM:  BP (!) 145/82   Pulse (!) 101   Temp 99.7 F (37.6 C) (Axillary)   Resp (!) 21   Ht 6\' 2"  (1.88 m)   Wt 93.2 kg   SpO2 100%   BMI 26.38 kg/m   Trach No sedation Eyes closed, winces to pain PERRL Localizes LUE WD otherwise throughout EVD in place, no meniscus/drainage   ASSESSMENT:  65 y.o. male with  Brainstem hemorrhage Hydrocephalus, s/p EVD  PLAN: - leave EVD for today, lowered to 76 - EEG CT today stable from yesterday, likely no longer needs EVD, will repeat CT in am and DC drain if stable    Thank you for allowing me to participate in this patient's care.  Please do not hesitate to call with questions or concerns.   , DO Neurosurgeon [facility] Cell: [phone]

## 2020-10-05 NOTE — Progress Notes (Signed)
IP rehab admissions - patient not medically ready for CIR yet.  I will follow up again tomorrow.  Call for questions.  [phone]

## 2020-10-05 NOTE — Progress Notes (Signed)
Occupational Therapy Treatment Patient Details Name: Walter [name] MRN: [id number] DOB: [birth year] Today's Date: 10/05/2020    History of present illness 65 yo male presenting 3/28 with R-sided weakness and slurred speech. Imaging revealed acute hemorrhage centered within the left dorsal midbrain extending into the dorsal pons and inferomedial thalamus with intraventricular extension. EVD placement for developing hydrocephalus on 3/29. Extubated 3/30. Pt with noted decrease in EVD drainage overnight 3/31, proximal and distal clots noted, flushed, repeat CT shows significant clot in R lateral ventricle. EVD with another episode of clotting overnight 4/2, intraventricular TpA administered. Reintubated 4/4. PMH Afib, COVID infection (July 2021), and retinal detachment.   OT comments  Pt continues to present with decreased arousal, strength, and balance. Pt requiring Total A +2 for bed mobility; RN present for line management. While sitting at EOB, pt requiring total A for grooming and washing face; noting pt grimacing with wiping of eyes. Pt keeping his eyes closed throughout session. No active movement noted at RUE and BLEs; some movement at LUE including grasping therapist's hand but not purposefully. Reviewed resting hand splint schedule with RN.Continue to recommend dc to SNF and will continue to follow acutely as admitted.   BP supine 146/75, sitting 167/104, and supine 143/82   Follow Up Recommendations  SNF   Equipment Recommendations  Wheelchair cushion (measurements OT);Wheelchair (measurements OT);Hospital bed;Other (comment) (respiratory needs at this time)    Recommendations for Other Services Other (comment) (Palliative)    Precautions / Restrictions Precautions Precautions: Fall Precaution Comments: EVD clamped during session Required Braces or Orthoses: Other Brace Other Brace: RUE resting hand splints, see schedule Restrictions Weight Bearing Restrictions: No        Mobility Bed Mobility Overal bed mobility: Needs Assistance Bed Mobility: Supine to Sit;Sit to Supine;Rolling Rolling: Total assist;+2 for physical assistance   Supine to sit: Total assist;+2 for physical assistance;+2 for safety/equipment;HOB elevated Sit to supine: Total assist;+2 for physical assistance;+2 for safety/equipment   General bed mobility comments: HOB elevated to faciliate elevation of trunk, totalA with +3 for line management to raise to sitting EOB    Transfers                 General transfer comment: NT due to lack of command following    Balance Overall balance assessment: Needs assistance Sitting-balance support: No upper extremity supported;Feet supported Sitting balance-Leahy Scale: Poor Sitting balance - Comments: requires maxA to maintain as pt demos no reactive balance, no initiation of movement to recover balance Postural control: Posterior lean;Right lateral lean                                 ADL either performed or assessed with clinical judgement   ADL Overall ADL's : Needs assistance/impaired     Grooming: Total assistance Grooming Details (indicate cue type and reason): Total A to wash face and wipe eyes. slight grimace to wiping eyes                               General ADL Comments: Total A for ADLs     Vision   Vision Assessment?: Yes Eye Alignment: Impaired (comment) (dysconjugate gaze) Additional Comments: Keeping eye closed   Perception     Praxis      Cognition Arousal/Alertness: Lethargic Behavior During Therapy: Flat affect Overall Cognitive Status: Difficult to assess  General Comments: not following any commands, response to noxious stimuli of LUE only, x2 brief instances of response to visual stimuli with R eye        Exercises Exercises: Other exercises Other Exercises Other Exercises: PROM shoulder, elbow wrist hand x10 reps  bil Other Exercises: PROM hip flexion knee extension/ flexion ankle pumps and hip abduction x10 reps bil   Shoulder Instructions       General Comments R splint in place; all looks good. BP supine 146/75, sitting 167/104, and supine 143/82    Pertinent Vitals/ Pain       Pain Assessment: Faces Faces Pain Scale: Hurts little more Pain Location: grimacing with washcloth to face. withdrawal to noxious of LUE only Pain Descriptors / Indicators: Grimacing Pain Intervention(s): Monitored during session;Limited activity within patient's tolerance;Repositioned  Home Living                                          Prior Functioning/Environment              Frequency  Min 2X/week        Progress Toward Goals  OT Goals(current goals can now be found in the care plan section)  Progress towards OT goals: Progressing toward goals  Acute Rehab OT Goals Patient Stated Goal: none stated, intubated OT Goal Formulation: With family Time For Goal Achievement: 10/12/20 Potential to Achieve Goals: Fair ADL Goals Pt Will Perform Grooming: with max assist;bed level Pt Will Perform Upper Body Dressing: with max assist;bed level Pt Will Transfer to Toilet: with +2 assist;with max assist;bedside commode;stand pivot transfer Additional ADL Goal #1: pt will perform bed mobility with total +2 max (A) as precursor to adls. Additional ADL Goal #2: pt will tolerate sitting EOB 10 minutes with mod (A) as precursor to adls  Plan Discharge plan remains appropriate    Co-evaluation    PT/OT/SLP Co-Evaluation/Treatment: Yes Reason for Co-Treatment: Complexity of the patient's impairments (multi-system involvement);For patient/therapist safety;To address functional/ADL transfers PT goals addressed during session: Mobility/safety with mobility;Balance OT goals addressed during session: ADL's and self-care      AM-PAC OT "6 Clicks" Daily Activity     Outcome Measure   Help  from another person eating meals?: Total Help from another person taking care of personal grooming?: Total Help from another person toileting, which includes using toliet, bedpan, or urinal?: Total Help from another person bathing (including washing, rinsing, drying)?: Total Help from another person to put on and taking off regular upper body clothing?: Total Help from another person to put on and taking off regular lower body clothing?: Total 6 Click Score: 6    End of Session Equipment Utilized During Treatment: Oxygen  OT Visit Diagnosis: Unsteadiness on feet (R26.81);Other abnormalities of gait and mobility (R26.89);Muscle weakness (generalized) (M62.81);Hemiplegia and hemiparesis Hemiplegia - Right/Left: Right Hemiplegia - dominant/non-dominant: Dominant Hemiplegia - caused by: Cerebral infarction   Activity Tolerance Patient tolerated treatment well   Patient Left in bed;with call bell/phone within reach;with bed alarm set;with family/visitor present;with nursing/sitter in room;Other (comment) (Vent)   Nurse Communication Mobility status;Precautions        Time: [phone] OT Time Calculation (min): 27 min  Charges: OT General Charges $OT Visit: 1 Visit OT Treatments $Therapeutic Activity: 8-22 mins  Eletha Culbertson MSOT, OTR/L Acute Rehab Pager: [phone] Office: [phone]   Theodoro Grist Mayumi Summerson 10/05/2020, 12:10 PM

## 2020-10-05 NOTE — Progress Notes (Signed)
Transitions of Care Team following this patient for discharge planning.  Currently, patient not medically stable; will continue to follow as patient progresses.    Destry Bezdek LCSW  

## 2020-10-05 NOTE — Progress Notes (Signed)
Physical Therapy Treatment Patient Details Name: Walter [name] MRN: [id number] DOB: [birth year] Today's Date: 10/05/2020    History of Present Illness 65 yo male presenting 3/28 with R-sided weakness and slurred speech. Imaging revealed acute hemorrhage centered within the left dorsal midbrain extending into the dorsal pons and inferomedial thalamus with intraventricular extension. EVD placement for developing hydrocephalus on 3/29. Extubated 3/30. Pt with noted decrease in EVD drainage overnight 3/31, proximal and distal clots noted, flushed, repeat CT shows significant clot in R lateral ventricle. EVD with another episode of clotting overnight 4/2, intraventricular TpA administered. Reintubated 4/4. PMH Afib, COVID infection (July 2021), and retinal detachment.    PT Comments    The pt was seen today by PT/OT to safely progress bed mobility and with goal of increased participation/activity with upright positioning. The pt continues to require totalA to complete all bed mobility, and maxA to maintain sitting EOB. The pt did grimace to OT wiping face with washcloth, but otherwise only withdrew to noxious stimulation to LUE. The pt did not follow any commands this session, will continue to benefit from skilled PT to improve stability, activity tolerance, and command following. Continue to recommend CIR level therapies.   BP: - supine prior to session: 146/75 (97) - sitting EOB: 167/104 (117) - supine after session: 143/82 (102)    Follow Up Recommendations  CIR     Equipment Recommendations  Other (comment) (defer to post acute)    Recommendations for Other Services       Precautions / Restrictions Precautions Precautions: Fall Precaution Comments: EVD clamped during session Required Braces or Orthoses: Other Brace Other Brace: bilateral resting hand splints, see schedule Restrictions Weight Bearing Restrictions: No    Mobility  Bed Mobility Overal bed mobility: Needs  Assistance Bed Mobility: Supine to Sit;Sit to Supine;Rolling Rolling: Total assist;+2 for physical assistance   Supine to sit: Total assist;+2 for physical assistance;+2 for safety/equipment;HOB elevated Sit to supine: Total assist;+2 for physical assistance;+2 for safety/equipment   General bed mobility comments: HOB elevated to faciliate elevation of trunk, totalA with +3 for line management to raise to sitting EOB    Transfers                 General transfer comment: NT due to lack of command following     Modified Rankin (Stroke Patients Only) Modified Rankin (Stroke Patients Only) Pre-Morbid Rankin Score: No symptoms Modified Rankin: Severe disability     Balance Overall balance assessment: Needs assistance Sitting-balance support: No upper extremity supported;Feet supported Sitting balance-Leahy Scale: Poor Sitting balance - Comments: requires maxA to maintain as pt demos no reactive balance, no initiation of movement to recover balance Postural control: Posterior lean;Right lateral lean                                  Cognition Arousal/Alertness: Lethargic Behavior During Therapy: Flat affect Overall Cognitive Status: Difficult to assess                                 General Comments: not following any commands, response to noxious stimuli of LUE only, x2 brief instances of response to visual stimuli with R eye      Exercises      General Comments General comments (skin integrity, edema, etc.): BP elevation with sitting EOB, HR to 100s  Pertinent Vitals/Pain Pain Assessment: Faces Faces Pain Scale: Hurts little more Pain Location: grimacing with washcloth to face. withdrawal to noxious of LUE only Pain Descriptors / Indicators: Grimacing Pain Intervention(s): Monitored during session           PT Goals (current goals can now be found in the care plan section) Acute Rehab PT Goals Patient Stated Goal: none  stated, intubated PT Goal Formulation: With patient Time For Goal Achievement: 10/16/20 Potential to Achieve Goals: Fair Progress towards PT goals: Not progressing toward goals - comment (pt remains unresponsive)    Frequency    Min 4X/week      PT Plan Current plan remains appropriate    Co-evaluation PT/OT/SLP Co-Evaluation/Treatment: Yes Reason for Co-Treatment: Complexity of the patient's impairments (multi-system involvement);For patient/therapist safety;To address functional/ADL transfers PT goals addressed during session: Mobility/safety with mobility;Balance        AM-PAC PT "6 Clicks" Mobility   Outcome Measure  Help needed turning from your back to your side while in a flat bed without using bedrails?: Total Help needed moving from lying on your back to sitting on the side of a flat bed without using bedrails?: Total Help needed moving to and from a bed to a chair (including a wheelchair)?: Total Help needed standing up from a chair using your arms (e.g., wheelchair or bedside chair)?: Total Help needed to walk in hospital room?: Total Help needed climbing 3-5 steps with a railing? : Total 6 Click Score: 6    End of Session Equipment Utilized During Treatment: Oxygen (vent trach) Activity Tolerance: Patient limited by fatigue;Patient limited by lethargy Patient left: in bed;with call bell/phone within reach;with bed alarm set;with SCD's reapplied Nurse Communication: Mobility status PT Visit Diagnosis: Hemiplegia and hemiparesis;Other abnormalities of gait and mobility (R26.89) Hemiplegia - Right/Left: Right Hemiplegia - dominant/non-dominant: Dominant Hemiplegia - caused by: Nontraumatic intracerebral hemorrhage     Time: [id number] PT Time Calculation (min) (ACUTE ONLY): 28 min  Charges:  $Therapeutic Activity: 8-22 mins                     Rolm Baptise, PT, DPT   Acute Rehabilitation Department Pager #: [phone]   Gaetana Michaelis 10/05/2020,  9:15 AM

## 2020-10-06 ENCOUNTER — Inpatient Hospital Stay (HOSPITAL_COMMUNITY): Payer: Medicare Other

## 2020-10-06 DIAGNOSIS — J9602 Acute respiratory failure with hypercapnia: Secondary | ICD-10-CM | POA: Diagnosis not present

## 2020-10-06 DIAGNOSIS — G9340 Encephalopathy, unspecified: Secondary | ICD-10-CM | POA: Diagnosis not present

## 2020-10-06 DIAGNOSIS — I629 Nontraumatic intracranial hemorrhage, unspecified: Secondary | ICD-10-CM | POA: Diagnosis not present

## 2020-10-06 DIAGNOSIS — R509 Fever, unspecified: Secondary | ICD-10-CM | POA: Diagnosis not present

## 2020-10-06 DIAGNOSIS — I613 Nontraumatic intracerebral hemorrhage in brain stem: Secondary | ICD-10-CM | POA: Diagnosis not present

## 2020-10-06 LAB — CBC
HCT: 36.4 % — ABNORMAL LOW (ref 39.0–52.0)
Hemoglobin: 11.7 g/dL — ABNORMAL LOW (ref 13.0–17.0)
MCH: 32.2 pg (ref 26.0–34.0)
MCHC: 32.1 g/dL (ref 30.0–36.0)
MCV: 100.3 fL — ABNORMAL HIGH (ref 80.0–100.0)
Platelets: 120 10*3/uL — ABNORMAL LOW (ref 150–400)
RBC: 3.63 MIL/uL — ABNORMAL LOW (ref 4.22–5.81)
RDW: 14.6 % (ref 11.5–15.5)
WBC: 12.4 10*3/uL — ABNORMAL HIGH (ref 4.0–10.5)
nRBC: 0.2 % (ref 0.0–0.2)

## 2020-10-06 LAB — BASIC METABOLIC PANEL
Anion gap: 7 (ref 5–15)
BUN: 22 mg/dL (ref 8–23)
CO2: 28 mmol/L (ref 22–32)
Calcium: 8.6 mg/dL — ABNORMAL LOW (ref 8.9–10.3)
Chloride: 120 mmol/L — ABNORMAL HIGH (ref 98–111)
Creatinine, Ser: 1 mg/dL (ref 0.61–1.24)
GFR, Estimated: >60 mL/min (ref >60)
Glucose, Bld: 203 mg/dL — ABNORMAL HIGH (ref 70–99)
Potassium: 4.4 mmol/L (ref 3.5–5.1)
Sodium: 155 mmol/L — ABNORMAL HIGH (ref 135–145)

## 2020-10-06 LAB — HEPATIC FUNCTION PANEL
ALT: 61 U/L — ABNORMAL HIGH (ref 0–44)
AST: 29 U/L (ref 15–41)
Albumin: 2.2 g/dL — ABNORMAL LOW (ref 3.5–5.0)
Alkaline Phosphatase: 43 U/L (ref 38–126)
Bilirubin, Direct: 0.2 mg/dL (ref 0.0–0.2)
Indirect Bilirubin: 0.5 mg/dL (ref 0.3–0.9)
Total Bilirubin: 0.7 mg/dL (ref 0.3–1.2)
Total Protein: 5.2 g/dL — ABNORMAL LOW (ref 6.5–8.1)

## 2020-10-06 LAB — MAGNESIUM
Magnesium: 2.4 mg/dL (ref 1.7–2.4)
Magnesium: 2.3 mg/dL (ref 1.7–2.4)

## 2020-10-06 LAB — URINALYSIS, ROUTINE W REFLEX MICROSCOPIC
Bilirubin Urine: NEGATIVE
Glucose, UA: NEGATIVE mg/dL
Ketones, ur: NEGATIVE mg/dL
Leukocytes,Ua: NEGATIVE
Nitrite: NEGATIVE
Protein, ur: 30 mg/dL — AB
Specific Gravity, Urine: >1.03 — ABNORMAL HIGH (ref 1.005–1.030)
pH: 5.5 (ref 5.0–8.0)

## 2020-10-06 LAB — URINALYSIS, MICROSCOPIC (REFLEX): RBC / HPF: >50 RBC/hpf (ref 0–5)

## 2020-10-06 LAB — GLUCOSE, CAPILLARY
Glucose-Capillary: 150 mg/dL — ABNORMAL HIGH (ref 70–99)
Glucose-Capillary: 156 mg/dL — ABNORMAL HIGH (ref 70–99)
Glucose-Capillary: 157 mg/dL — ABNORMAL HIGH (ref 70–99)
Glucose-Capillary: 162 mg/dL — ABNORMAL HIGH (ref 70–99)
Glucose-Capillary: 170 mg/dL — ABNORMAL HIGH (ref 70–99)
Glucose-Capillary: 213 mg/dL — ABNORMAL HIGH (ref 70–99)

## 2020-10-06 LAB — SODIUM
Sodium: 156 mmol/L — ABNORMAL HIGH (ref 135–145)
Sodium: 153 mmol/L — ABNORMAL HIGH (ref 135–145)

## 2020-10-06 MED ORDER — LIDOCAINE HCL URETHRAL/MUCOSAL 2 % EX GEL
1.0000 "application " | Freq: Once | CUTANEOUS | Status: AC
  Administered 2020-10-06: 1 via URETHRAL
  Filled 2020-10-06: qty 11

## 2020-10-06 MED ORDER — ACETAMINOPHEN 325 MG PO TABS: 650.0000 mg | ORAL_TABLET | ORAL | Status: DC

## 2020-10-06 MED ORDER — MEPERIDINE HCL 25 MG/ML IJ SOLN
25.0000 mg | Freq: Once | INTRAMUSCULAR | Status: AC
  Administered 2020-10-06: 25 mg via INTRAVENOUS
  Filled 2020-10-06: qty 1

## 2020-10-06 MED ORDER — ACETAMINOPHEN 160 MG/5ML PO SOLN
650.0000 mg | ORAL | Status: DC
  Administered 2020-10-06 – 2020-10-13 (×39): 650 mg
  Filled 2020-10-06 (×39): qty 20.3

## 2020-10-06 MED ORDER — VANCOMYCIN HCL 1500 MG/300ML IV SOLN
1500.0000 mg | Freq: Two times a day (BID) | INTRAVENOUS | Status: DC
  Administered 2020-10-07 (×2): 1500 mg via INTRAVENOUS
  Filled 2020-10-06 (×2): qty 300

## 2020-10-06 MED ORDER — VANCOMYCIN HCL 2000 MG/400ML IV SOLN
2000.0000 mg | Freq: Once | INTRAVENOUS | Status: AC
  Administered 2020-10-06: 2000 mg via INTRAVENOUS
  Filled 2020-10-06: qty 400

## 2020-10-06 MED ORDER — WHITE PETROLATUM EX OINT
TOPICAL_OINTMENT | CUTANEOUS | Status: AC
  Filled 2020-10-06: qty 28.35

## 2020-10-06 MED ORDER — FREE WATER
150.0000 mL | Status: DC
  Administered 2020-10-06 – 2020-10-08 (×13): 150 mL

## 2020-10-06 MED ORDER — SODIUM CHLORIDE 0.9 % IV SOLN
2.0000 g | Freq: Three times a day (TID) | INTRAVENOUS | Status: AC
  Administered 2020-10-06 – 2020-10-13 (×23): 2 g via INTRAVENOUS
  Filled 2020-10-06 (×23): qty 2

## 2020-10-06 MED ORDER — BUSPIRONE HCL 10 MG PO TABS
30.0000 mg | ORAL_TABLET | Freq: Three times a day (TID) | ORAL | Status: DC
  Filled 2020-10-06 (×3): qty 2
  Filled 2020-10-06: qty 3

## 2020-10-06 MED ORDER — HEPARIN SODIUM (PORCINE) 5000 UNIT/ML IJ SOLN
5000.0000 [IU] | Freq: Three times a day (TID) | INTRAMUSCULAR | Status: DC
  Administered 2020-10-06 – 2020-10-17 (×33): 5000 [IU] via SUBCUTANEOUS
  Filled 2020-10-06 (×34): qty 1

## 2020-10-06 MED ORDER — INSULIN ASPART 100 UNIT/ML ~~LOC~~ SOLN
0.0000 [IU] | SUBCUTANEOUS | Status: DC
  Administered 2020-10-06: 4 [IU] via SUBCUTANEOUS
  Administered 2020-10-06 – 2020-10-07 (×3): 3 [IU] via SUBCUTANEOUS
  Administered 2020-10-07 (×2): 4 [IU] via SUBCUTANEOUS
  Administered 2020-10-07: 3 [IU] via SUBCUTANEOUS
  Administered 2020-10-07 (×2): 4 [IU] via SUBCUTANEOUS
  Administered 2020-10-08: 3 [IU] via SUBCUTANEOUS
  Administered 2020-10-08 (×3): 4 [IU] via SUBCUTANEOUS
  Administered 2020-10-08 (×2): 3 [IU] via SUBCUTANEOUS
  Administered 2020-10-09: 4 [IU] via SUBCUTANEOUS
  Administered 2020-10-09: 3 [IU] via SUBCUTANEOUS
  Administered 2020-10-09 (×2): 4 [IU] via SUBCUTANEOUS
  Administered 2020-10-10: 3 [IU] via SUBCUTANEOUS
  Administered 2020-10-10 (×2): 4 [IU] via SUBCUTANEOUS
  Administered 2020-10-10: 3 [IU] via SUBCUTANEOUS
  Administered 2020-10-10: 4 [IU] via SUBCUTANEOUS
  Administered 2020-10-10 – 2020-10-11 (×2): 3 [IU] via SUBCUTANEOUS
  Administered 2020-10-11: 4 [IU] via SUBCUTANEOUS
  Administered 2020-10-11 (×3): 3 [IU] via SUBCUTANEOUS
  Administered 2020-10-11 – 2020-10-12 (×2): 4 [IU] via SUBCUTANEOUS
  Administered 2020-10-12: 3 [IU] via SUBCUTANEOUS
  Administered 2020-10-12 (×2): 4 [IU] via SUBCUTANEOUS
  Administered 2020-10-12: 3 [IU] via SUBCUTANEOUS
  Administered 2020-10-12: 4 [IU] via SUBCUTANEOUS
  Administered 2020-10-13 (×3): 3 [IU] via SUBCUTANEOUS
  Administered 2020-10-13: 4 [IU] via SUBCUTANEOUS
  Administered 2020-10-14: 3 [IU] via SUBCUTANEOUS
  Administered 2020-10-14 (×2): 4 [IU] via SUBCUTANEOUS
  Administered 2020-10-14 (×2): 3 [IU] via SUBCUTANEOUS
  Administered 2020-10-14: 4 [IU] via SUBCUTANEOUS
  Administered 2020-10-14: 3 [IU] via SUBCUTANEOUS
  Administered 2020-10-15 (×2): 4 [IU] via SUBCUTANEOUS
  Administered 2020-10-15: 3 [IU] via SUBCUTANEOUS
  Administered 2020-10-15 – 2020-10-16 (×8): 4 [IU] via SUBCUTANEOUS
  Administered 2020-10-17: 7 [IU] via SUBCUTANEOUS
  Administered 2020-10-17 (×2): 4 [IU] via SUBCUTANEOUS

## 2020-10-06 MED ORDER — POTASSIUM CHLORIDE 20 MEQ PO PACK: 40.0000 meq | PACK | Freq: Two times a day (BID) | ORAL | Status: DC

## 2020-10-06 MED ORDER — BUSPIRONE HCL 10 MG PO TABS
30.0000 mg | ORAL_TABLET | Freq: Three times a day (TID) | ORAL | Status: DC
  Administered 2020-10-06 – 2020-10-13 (×20): 30 mg
  Filled 2020-10-06: qty 3
  Filled 2020-10-06: qty 2
  Filled 2020-10-06: qty 3
  Filled 2020-10-06: qty 2
  Filled 2020-10-06: qty 3
  Filled 2020-10-06: qty 2
  Filled 2020-10-06 (×3): qty 3
  Filled 2020-10-06 (×2): qty 2
  Filled 2020-10-06: qty 3
  Filled 2020-10-06 (×6): qty 2

## 2020-10-06 MED ORDER — ACETAMINOPHEN 650 MG RE SUPP: 650.0000 mg | RECTAL | Status: DC

## 2020-10-06 NOTE — Progress Notes (Signed)
Dr Roda Shutters updated family. He requested I reach out to CCM re TTM normothermia. Orders received.

## 2020-10-06 NOTE — Progress Notes (Addendum)
Pharmacy Antibiotic Note  Walter [name] is a 65 y.o. male admitted on 09/25/2020 with ICH now with fever of unknown source.  Pharmacy has been consulted to broaden antibiotics from Unasyn to Cefepime and vancomycin. Tm 103.2 F. WBC up to 12.4. SCr wnl   Plan: -Cefepime 2 gm IV Q 8 hours -Vancomycin 2 gm IV load followed by Vancomycin 1500 mg IV Q 12 hrs. Goal AUC 400-550. Expected AUC: 541 SCr used: 1 -Monitor fever and leukocytosis curve  -Repeating BCx and UA today -Vanc levels as indicated    Height: 6\' 2"  (188 cm) Weight: 102.4 kg (225 lb 12 oz) IBW/kg (Calculated) : 82.2  Temp (24hrs), Avg:102.2 F (39 C), Min:99.7 F (37.6 C), Max:103.6 F (39.8 C)  Recent Labs  Lab 10/03/20 0854 10/03/20 2035 10/04/20 0602 10/04/20 0748 10/05/20 0412 10/06/20 0538  WBC 11.0* 9.7 10.3  --  10.6* 12.4*  CREATININE 1.14 1.08 0.95 0.99 0.97 1.00    Estimated Creatinine Clearance: 94.1 mL/min (by C-G formula based on SCr of 1 mg/dL).    Allergies  Allergen Reactions  . Latex Itching    Skin turns real red    Antimicrobials this admission: CTX 4/1 >> 4/3 Unasyn 4/4 >>4/8 Cefepime 4/8 >>  Vancomycin 4/8 >>   Dose adjustments this admission:  Microbiology results: 4/8 BCx:  4/5 TA >> negF 3/30 TA >> abundant moraxella (beta lactamase positive)   Thank you for allowing pharmacy to be a part of this patient's care.  4/30, PharmD., BCPS, BCCCP Clinical Pharmacist Please refer to [facility] for unit-specific pharmacist   Addendum: Per CCM, will need meningitic coverage. Will keep current dosage but adjust based on vancomycin trough and NOT AUC.   [facility], PharmD., BCPS, BCCCP Clinical Pharmacist Please refer to [facility] for unit-specific pharmacist

## 2020-10-06 NOTE — Progress Notes (Signed)
While taking patient to CT ensuring there was slack in the EVD line the EVD fell out of patient's head. Patient was brought back to the room and pressure was held and dressing applied, Costella with neurosurgery notified, no further orders, patient taken to CT.

## 2020-10-06 NOTE — Progress Notes (Signed)
Non-latex foley ordered. Urine culture obtained from I&O cath at 1530. Will place non-latex foley next time bladder full.

## 2020-10-06 NOTE — Progress Notes (Signed)
Phlebotomist Ron unable to get blood for cultures.in three sticks. He requested other phlebotomist try.

## 2020-10-06 NOTE — Progress Notes (Signed)
Second phlebotomist having difficulty obtaining labs. CCM notified and one time foot stick order obtained.

## 2020-10-06 NOTE — Progress Notes (Signed)
  NEUROSURGERY PROGRESS NOTE   Received call from nursing that just prior to transport to CT, EVD was dislodged from patient's head. Per Dr Jake Samples, EVD was going to be removed this am if the follow up CT was stable, which it does appear to be so. No reason to replace EVD. EVD site was cleaned and twp staples were placed over the incision. Please call for any concerns.  Cindra Presume, PA-C [facility]

## 2020-10-06 NOTE — Progress Notes (Signed)
PT Cancellation Note  Patient Details Name: Walter [name] MRN: [id number] DOB: Jul 12, 1955   Cancelled Treatment:    Reason Eval/Treat Not Completed: Medical issues which prohibited therapy this afternoon. Pt just arriving from MRI upon arrival of PT, after discussion with RN, PT will hold at this time to await MRI results and progress with established POC as schedule and pt medical stability allow.   Rolm Baptise, PT, DPT   Acute Rehabilitation Department Pager #: [phone]   Gaetana Michaelis 10/06/2020, 5:05 PM

## 2020-10-06 NOTE — Progress Notes (Addendum)
eLink Physician-Brief Progress Note Patient Name: Walter [name] DOB: Jan 01, 1956 MRN: [id number]   Date of Service  10/06/2020  HPI/Events of Note  Ordered demerol for rigors occurring in the setting of fever of central origin.   eICU Interventions  As above.   ADDENDUM 10/07/20 1:51 AM  - Earlier dose of Demerol was effective at reducing rigors and subsequently temp. - Now, rigors and fever have returned. Will give 1x additional dose of Demerol 25mg  IV.  ADDENDUM 10/07/20 3:38 AM  - Patient did not seem to benefit as much from the second dose of meperidine. Shivering if ongoing. - He is already on Buspar + tylenol. Will start Precedex which has also been shown to decrease shivering.  Intervention Category Intermediate Interventions: Other:  12/07/20 10/06/2020, 7:56 PM

## 2020-10-06 NOTE — Progress Notes (Signed)
Spoke with Dr. Benjamin Stain about the current need to advance to arctic sun or if we can try other measures such as demerol to help with the shivering. Dr. Benjamin Stain agreeable, will try demerol and if ineffective will start the patient on arctic sun.

## 2020-10-06 NOTE — Progress Notes (Signed)
LTM maint complete - no skin breakdown under: FZ,P8,P4,GRND,REF

## 2020-10-06 NOTE — Progress Notes (Signed)
LTM EEG rehooked up and running - no initial skin breakdown - push button tested - neuro notified.new leads used

## 2020-10-06 NOTE — Progress Notes (Signed)
RT NOTE: RT transported patient on ventilator from room 4N29 to MRI and back to room 4N29 with no complications. Vitals are stable. RT will continue to monitor.

## 2020-10-06 NOTE — Progress Notes (Signed)
   Providing Compassionate, Quality Care - Together  NEUROSURGERY PROGRESS NOTE   S: No issues overnight.  During transport to CT this a.m., the EVD was pulled out.  The overnight team cleaned and stapled exit site.  O: EXAM:  BP 117/77   Pulse 97   Temp (!) 101.7 F (38.7 C) Comment: cooling blnkt  Resp (!) 24   Ht 6\' 2"  (1.88 m)   Wt 102.4 kg   SpO2 97%   BMI 28.98 kg/m   Trach in place, dressing around trach PERRL Eyes closed EEG in place Intermittently moving the left side greater than the right side to pain EVD incision clean and intact   ASSESSMENT:  65 y.o. male with   Brainstem hemorrhage Hydrocephalus, s/p EVD, EVD removed 10/06/2020  PLAN: - EEG - CT brain stable this a.m., ventricular size stable.  The aqueduct appears open therefore I do not believe the patient needs external ventricular drain at this time. -Fever work-up per primary/CCM -Discussed with the CCM team and neurology team, if concern for CNS infection, LP may be obtained for CSF analysis.  Unfortunately the EVD was clotted and therefore could not be aspirated yesterday for CSF analysis. -Updated the wife and daughter at bedside and answered all their questions.  Continue supportive care   Thank you for allowing me to participate in this patient's care.  Please do not hesitate to call with questions or concerns.   12/06/2020, DO Neurosurgeon [facility] Cell: [phone]

## 2020-10-06 NOTE — Progress Notes (Signed)
SLP Cancellation Note  Patient Details Name: Walter [name] MRN: [id number] DOB: [birth year]   Cancelled treatment:        Currently trach and ventilator support, sedated. Will follow.    Royce Macadamia 10/06/2020, 3:54 PM   Breck Coons Lonell Face.Ed Nurse, children's [phone] Office [phone]

## 2020-10-06 NOTE — Plan of Care (Signed)
I had long discussion with patient 2 daughters at bedside, updated pt current condition, reviewed images, discussed about treatment plan and potential prognosis, and answered all the questions. Will continue cefepime and vancomycin for empiric treatment.  If no improvement, may consider lumbar puncture and C. difficile work-up.  Still has high-grade fever, recommend cooling blanket with bear hugger, if not effective, recommend Arctic sun measurement.  Continue long-term EEG to rule out seizure, so far no clear evidence of seizure but evidence of SIRPEDs. They expressed understanding and appreciation.   Marvel Plan, MD PhD Stroke Neurology 10/06/2020 8:26 PM

## 2020-10-06 NOTE — Progress Notes (Addendum)
NAME:  Walter [name], MRN:  [id number], DOB:  Aug 25, 1955, LOS: 11 ADMISSION DATE:  09/25/2020, CONSULTATION DATE:  09/25/20 REFERRING MD:  Wilford Corner,  CHIEF COMPLAINT:  Headache, r side weakness, slurred speech  History of Present Illness:  Walter [name] is a 65 yo man with hx of afib not on anticoagulation admitted with right-sided weakness, hemorrhagic stroke. Emergently intubated in the ED for airway protection  Pertinent  Medical History  Aflutter/afib (not on ac) S/p Ablation 03/2020, hx of Cardioversion 2015 Hx retinal detachment Covid infection in July 2021 treated with monoclonal antibody Hx CV   Significant Hospital Events: Including procedures, antibiotic start and stop dates in addition to other pertinent events   . 3/28- Admit, intubated.  Not a candidate for TPA due to intracranial hemorrhage.  Started 3% saline . 3/29- EVD placement for developing hydrocephalus. CTA of the head was unremarkable . 3/30- Extubated. . 4/1 respiratory culture grew Moraxella, on ceftriaxone . 4/4 reintubated because of hypoxia and unresponsiveness . 4/6 tracheostomy . 4/7 still encephalopathic  . 4/8 remains encephalopathic  Interim History / Subjective:   Continues to ooze from trach site, Encephalopathic .  T max 103.6 with increase in WBC to 12.4, platelets are 120K Continued need for I&O cath despite urecholine Na is 155, K 4.4, Chloride  120, Glucose 203  Net + 4.2 L, ( 1525 UO last 24), Creatinine 1.0  Per EEG, generalizedperiodic discharges with triphasic morphology at 1.5 to 2.5 Hzwithout definite evolution. These could be focal motor seizures vs stimulation induced myoclonus >> currently on Vimpat  Put on PSV and became tachyneic  Then tachypneic -- put back on PRVC   PICC was d/c'd 4/7, Pt has 2 PIV's  Objective   Blood pressure 117/77, pulse 97, temperature (!) 102.9 F (39.4 C), temperature source Oral, resp. rate (!) 24, height 6\' 2"  (1.88 m), weight 102.4 kg, SpO2 97 %.     Vent Mode: PRVC FiO2 (%):  [30 %] 30 % Set Rate:  [20 bmp] 20 bmp Vt Set:  [650 mL] 650 mL PEEP:  [5 cmH20] 5 cmH20 Plateau Pressure:  [15 cmH20-29 cmH20] 15 cmH20   Intake/Output Summary (Last 24 hours) at 10/06/2020 0844 Last data filed at 10/06/2020 0300 Gross per 24 hour  Intake 570.02 ml  Output 1525 ml  Net -954.98 ml   Filed Weights   10/01/20 0400 10/02/20 0400 10/06/20 0400  Weight: 102.1 kg 93.2 kg 102.4 kg    Examination: Gen:     Ill appearing older adult M trach/vent, NAD  HEENT:  EVD in place minimal  drainage. Anicteric sclera. Trach secure, continued  oozing around stoma.  Lungs:    Bilateral chest excursion, Coarse breath  sounds,  Mechanically ventilated.  CV:       s1s2 no rgm cap refill brisk , No JVD, some upper extremity edema Abd:     Soft round nd. + bowel sounds  X 4 quads, Loose stool per Flexiseal Ext:   No acute joint deformity no cyanosis or clubbing. Toenail fungus.  Skin:     Warm no rash, no lesions, clean, dry and intact, ? Cellulitis L knee cap area Neuro:   L side antigravity. Purposeful mopvement L arm, PERRL. + gag, cough, corneals   Labs/imaging that I havepersonally reviewed  (right click and "Reselect all SmartList Selections" daily)   CT head 4/1: New intraventricular hemorrhage in the third and lateral ventricles. Fourth ventricular and midbrain hemorrhage is unchanged. 2. New right frontal  EVD which traverses the right lateral ventricle. Blood clot encompasses the lower catheter and ventriculomegaly is similar to prior. 3. Small cortical infarcts are newly seen along the superior left frontal convexity  CT head 4/3: 1. Stable right superior frontal approach EVD with decreased volume of intraventricular hemorrhage and improved ventricular decompression from 2 days ago.  2. Small infarcts have developed in both the left superior frontal gyrus and the left cerebellum PICA territory since the MRI 09/25/2020, not significantly  changed from 09/29/2020.  3. Left dorsal midbrain intra-axial hemorrhage has not significantly changed since presentation. Associated brainstem edema.  4. Stable trace subarachnoid hemorrhage is stable and may be related to EVD placement.  CT head 4/4: 1. Interval decrease in intraventricular hemorrhage with decreased size of the lateral ventricles as compared to prior, right greater than left. Right parietal approach ventriculostomy in stable position with tip near the septum pellucidum. 2. Stable small volume subarachnoid and intraparenchymal hemorrhage along the catheter tract. 3. Stable 1.9 cm hemorrhage at the left dorsal pons. 4. No other new acute intracranial abnormality  CT head 4/5: Ventricle size shows mild interval enlargement since yesterday. No significant hydrocephalus. Intraventricular hemorrhage stable Right frontal ventricular drain unchanged. Hemorrhage surrounding the drain unchanged. Subarachnoid hemorrhage unchanged. Hemorrhage in the left tectum unchanged. No new hemorrhage identified  4/7 -- BMP, CBC: NA 157 K 3.4 WBC 10.6 plt 129   4/8  EEG>> stimulation induced rhythmic periodicictal-interictaldischarges( SIRPIDS) Event button was pressed on 10/05/2020 at 1510 for left upper extremity twitching.  Concomitant eeg showedgeneralizedperiodic discharges with triphasic morphology at 1.5 to 2.5 Hzwithout definite evolution. These could be focal motor seizures vs stimulation induced myoclonus as noted on previous day.  Resolved Hospital Problem list   AKI   Assessment & Plan:   Acute midbrain/dorsal pons/ thalamic IPH  -ICH score 2  Acute obstructive hydrocephalus s/p EVD  Possible seizure -? focal motor vs stimulation induced myoclonus  P EVD isnt draining much -- defer to NSGY but wonder if this might be able to be removed soon?  Off 3%  Neuro primary  EEG  Vimpat (did not tolerate keppra, possible angioedema)  Will check mag today and replete if  < 2  Consult neuro re anti seizure medication options  Acute encephalopathy -ICH, ? Metabolic component, ?medication side effect (though not on continuous sedation)  P Limit sedation Trend neuro exam CEEG  Acute respiratory failure S/p tracheostomy >> continued to ooze, HGB stable SQ heparin held 4/7 pm P Routine trach care No weaning 4/8 VAP PAD -- no continuous sedation Thrombi pad for trach ooze Will have surgery  evaluate  4/8 Consider CT soft tissue neck  Fever to 103.6 Increase in WBC CXR with atelectasis vs infiltrate 4/7 vs neuro etiology Day 5 Unasyn Concern he could be aspirating blood from trach Plan Blood Cx x 2 UA Sputum Cx Will increase abx coverage to Vanc, Cefepime per pharmacy CXR Follow micro Consider C diff Cx if no other etiology discovered   Afib - currently sinus P Electrolyte optimization  Cont home dilt  ICU monitoring   Hypernatremia  -was on 3%  - Free Water started 4/7 Hypokalemia, mild  P FWF K replacement as needed Trend BMP Will increase free water to 150 Q 4  Thrombocytopenia, mild  Platelets 120K on 4/8 Trend   Will d/c heparin and asa 4/8 Please reassess daily for restart once bleeding trach is resolved.   Hyperglycemia Will increase SS Coverage to  Urinary retention Continues to require I&O cath Q 6 P Urecholine Trial of dc foley   Best Practice (right click and "Reselect all SmartList Selections" daily)   Diet:  Tube Feed  Pain/Anxiety/Delirium protocol (if indicated): RASS goal 0 VAP protocol (if indicated): Yes DVT prophylaxis: SCD GI prophylaxis: PPI Glucose control:  SSI Yes Central venous access:  Yes, and it is no longer needed if able to get PIVs  Arterial line:  N/A Foley:  Yes, and it is no longer needed-- will trial dc 4/7  Mobility:  bed rest  PT consulted: Yes Code Status:  full code Prognosis: Guarded Last date of multidisciplinary goals of care discussion 14/5 Spoke with patient's  daughter and wife, explained situation, they would like to proceed with tracheostomy and continue aggressive care   CRITICAL CARE Performed by: Bevelyn Ngo  Total critical care time: 35 minutes  Critical care time was exclusive of separately billable procedures and treating other patients. Critical care was necessary to treat or prevent imminent or life-threatening deterioration.  Critical care was time spent personally by me on the following activities: development of treatment plan with patient and/or surrogate as well as nursing, discussions with consultants, evaluation of patient's response to treatment, examination of patient, obtaining history from patient or surrogate, ordering and performing treatments and interventions, ordering and review of laboratory studies, ordering and review of radiographic studies, pulse oximetry and re-evaluation of patient's condition.  Bevelyn Ngo, MSN, AGACNP-BC [facility] See Amion for personal pager PCCM on call pager [phone] 10/06/2020, 8:44 AM

## 2020-10-06 NOTE — Progress Notes (Incomplete)
Pend

## 2020-10-06 NOTE — Procedures (Signed)
Diagnostic Bronchoscopy  Walter [name]  [id number]  1956-03-11  Date:10/06/20  Time:2:44 PM   Provider Performing:Loukas Antonson C Jahmel Flannagan   Procedure: Diagnostic Bronchoscopy (95747)  Indication(s) Assist with direct visualization of tracheostomy placement  Consent Risks of the procedure as well as the alternatives and risks of each were explained to the patient and/or caregiver.  Consent for the procedure was obtained.   Anesthesia See separate tracheostomy note   Time Out Verified patient identification, verified procedure, site/side was marked, verified correct patient position, special equipment/implants available, medications/allergies/relevant history reviewed, required imaging and test results available.   Sterile Technique Usual hand hygiene, masks, gowns, and gloves were used   Procedure Description Bronchoscope advanced through endotracheal tube and into airway.  After suctioning out tracheal secretions, bronchoscope used to provide direct visualization of tracheostomy placement.   Complications/Tolerance None; patient tolerated the procedure well.   EBL None  Specimen(s) None

## 2020-10-06 NOTE — Progress Notes (Signed)
IP rehab admissions - I am following for potential acute inpatient rehab admission.  I will call his wife to discuss rehab options.  Call me for questions.  [phone]

## 2020-10-06 NOTE — Progress Notes (Signed)
Pt's fevers persist. Discussed with medical team. Cooling blanket applied.

## 2020-10-06 NOTE — Progress Notes (Addendum)
STROKE TEAM PROGRESS NOTE  Significant Events: 3/29 s/p EVD 3/30 extubated  4/4 Required re-intubation for resp failure with hypoxia and unresponsiveness. Repeat CT showed clearance of IVH at third ventricle and upper lateral ventricles. Decompressed right lateral ventricle.  4/5 Seizure activity suspected, repeat HCT stable, Keppra started. IVC catheter drainage stopped and was flushed 4/6 Keppra changed to Vimpat d/t possible allergy (?lip angioedema). Afib with RVR overnight. Febrile to 102.4. Remains encephalopathic off sedation.  4/7 oozing from trach site. Remains encephalopathic off sedation.   INTERVAL HISTORY Febrile to 103.6, Tachy to 130s, BP 120-160 systolic, mostly elevated in 150s. Tachypneic 20s. Leukocytosis 12,400.  Unable to obtain aspirate from IVC as it was clotted.  CCM planning to broaden antibiotic regimen.  IVC fell out on route to CT scanner this morning (Neurosurg planning for removal if CT head stable). Repeat CT head stable.  Trach oozing through the night and has slowed per Consuella Lose, Charity fundraiser. Hgb stable at 11.7. Remains minimally responsive, encephalopathic off sedation.  LTM continued with generalized SIRPIDS and diffuse nonspecific enceophalopathy reported by Dr. Melynda Ripple.   Vitals:   10/06/20 0305 10/06/20 0400 10/06/20 0500 10/06/20 0600  BP:  (!) 162/86 (!) 147/82 (!) 145/94  Pulse: (!) 115 (!) 123 (!) 125 (!) 134  Resp: (!) 32 (!) 28 (!) 26 19  Temp:  (!) 103.2 F (39.6 C)    TempSrc:  Oral    SpO2: 100% 97% 97% 96%  Weight:  102.4 kg    Height:       CBC:  Recent Labs  Lab 10/03/20 2035 10/04/20 0602 10/05/20 0412 10/06/20 0538  WBC 9.7   < > 10.6* 12.4*  NEUTROABS 7.8*  --   --   --   HGB 11.5*   < > 11.5* 11.7*  HCT 36.1*   < > 36.7* 36.4*  MCV 101.1*   < > 100.3* 100.3*  PLT 125*   < > 129* 120*   < > = values in this interval not displayed.   Basic Metabolic Panel:  Recent Labs  Lab 10/03/20 2035 10/04/20 0602 10/05/20 0412 10/05/20 0934  10/05/20 2028 10/06/20 0538  NA 154*   < > 157*   < > 154* 155*  K 3.9   < > 3.4*  --   --  4.4  CL 127*   < > 123*  --   --  120*  CO2 28   < > 26  --   --  28  GLUCOSE 190*   < > 208*  --   --  203*  BUN 27*   < > 22  --   --  22  CREATININE 1.08   < > 0.97  --   --  1.00  CALCIUM 8.0*   < > 8.2*  --   --  8.6*  MG 2.5*  --  2.3  --   --   --   PHOS 2.9  --   --   --   --   --    < > = values in this interval not displayed.   Lipid Panel:  No results for input(s): CHOL, TRIG, HDL, CHOLHDL, VLDL, LDLCALC in the last 168 hours. HgbA1c:  No results for input(s): HGBA1C in the last 168 hours. Urine Drug Screen:  No results for input(s): LABOPIA, COCAINSCRNUR, LABBENZ, AMPHETMU, THCU, LABBARB in the last 168 hours.  Alcohol Level  No results for input(s): ETH in the last 168 hours.  IMAGING  CT head 4/6 1. Ventricle size shows mild interval enlargement since yesterday. No significant hydrocephalus. Intraventricular hemorrhage stable 2. Right frontal ventricular drain unchanged. Hemorrhage surrounding the drain unchanged. 3. Subarachnoid hemorrhage unchanged. Hemorrhage in the left tectum unchanged. No new hemorrhage identified.  CT head 4/4 1. Interval decrease in intraventricular hemorrhage with decreased size of the lateral ventricles as compared to prior, right greater than left. Right parietal approach ventriculostomy in stable position with tip near the septum pellucidum. 2. Stable small volume subarachnoid and intraparenchymal hemorrhage along the catheter tract. 3. Stable 1.9 cm hemorrhage at the left dorsal pons. 4. No other new acute intracranial abnormality. 09/29/2020  CT head 4/3 IMPRESSION: 1. New intraventricular hemorrhage in the third and lateral ventricles. Fourth ventricular and midbrain hemorrhage is unchanged. 2. New right frontal EVD which traverses the right lateral ventricle. Blood clot encompasses the lower catheter and ventriculomegaly is similar  to prior. 3. Small cortical infarcts are newly seen along the superior left frontal convexity.  09/26/2020 IR Angio IMPRESSION: 1. No evidence of an AVM, dural arteriovenous fistula, aneurysm or other vascular abnormality to explain patient's brainstem hemorrhage.  09/26/2020 CT head:  IMPRESSION: 1. Unchanged left brainstem hemorrhage extending into the fourth ventricle. 2. Worsening hydrocephalus.  09/25/2020 MRI brain w/wo, MR Venogram IMPRESSION: 1. Acute hemorrhage centered within the left dorsal midbrain extending into the dorsal pons and inferomedial thalamus with intraventricular extension. No abnormal enhancement to suggest underlying lesion. 2. Compression of the cerebral aqueduct with possible mild obstructive hydrocephalus. 3. No evidence of dural sinus thrombosis. Attenuation of the left basal vein in the quadrigeminal cistern without definite occlusion.  09/25/2020 CT Angio head/neck IMPRESSION: 1. No vascular malformation, anomaly or aneurysm. 2. No emergent large vessel occlusion or high-grade stenosis of the intracranial arteries. 3. Unchanged size of dorsal left pontine hemorrhage with intraventricular extension.  09/25/2020 CT head code stroke IMPRESSION: Acute intraparenchymal hemorrhage in the dorsal left pons with subarachnoid extension into the cerebral aqueduct and fourth Ventricle.  EEG-LTM 4/5-4/6  This study showed periodic discharges with triphasic morphology at 1.5 to 2.5 Hz which is on the ictal-interictal continuum with low to intermediate potential for seizures.  Additionally there is evidence of moderate to severe diffuse encephalopathy, nonspecific etiology.  No seizures were seen during the study  PHYSICAL EXAM Blood pressure (!) 145/94, pulse (!) 134, temperature (!) 103.2 F (39.6 C), temperature source Oral, resp. rate 19, height 6\' 2"  (1.88 m), weight 102.4 kg, SpO2 96 %.  General: Elderly caucasian male, critically ill,  intubated orally. Sedated.  Right frontal ventriculostomy catheter in place. Angio edema of upper lip improving.  Lungs: Symmetrical Chest rise, no labored breathing Cardio: Irregular Abdomen: Soft, non-tender  Neurological Exam :  Intubated, sedated. Keeps eyes closed.  Eyes in mid position. PERRL. Positive doll's eyes,  Minimal corneal responsiveness.  +blink to threat on left, absent on right. Facial symmetric, + cough during head turning  Motor: Spontaneously moving left upper and lower extremity without evident purpose and  not in response to commands or stimuli.  No withdrawal to noxious stimuli right hemibody.  Tone and bulk:normal tone throughout; no atrophy noted  ASSESSMENT/PLAN Walter [name] is a 65 y.o. male with history of atrial flutter newly diagnosed with atrial fibrillation in February 2022 not on anticoagulation, follows with Dr. March 2022 in cardiology.  He presented with right-sided weakness, slurred speech, headache, and vomiting.Last known well when he went to bed at 10 PM on 09/24/2020 and woke up at around  2 AM on the day of admission with slurred speech.  EMS was called.He was very drowsy upon their assessment and notable right side weakeness.  He had worsening mental status, GCS 5, so was emergently intubated in ED for airway protection.  His MRI brain showed acute hemorrhage at left midbrain extending into pons with IVH associated with obstructive hydrocephalus.    ICH - Left Dorsalmidbrain ICHextending into dorsal pons and inferior thalamus with mild mass-effect on third ventricle and hydrocephalus,source unknown, suspect occult cavernoma  Code Stroke CT head-Acute IPH in the dorsal left pons with subarachnoid extension into the cerebral aqueduct and fourth ventricle.  CTA head & neck-No vascular malformation, anomaly or aneurysm. No emergent large vessel occlusion or high-grade stenosis. Unchanged size of dorsal left pontine hemorrhage with  intraventricular extension.  MRI& MR Venogram-Acute hemorrhage centered within the left dorsal midbrain extending into the dorsal pons and inferomedial thalamus with intraventricular extension. Compression of the cerebral aqueduct with possible mild obstructive hydrocephalus. No evidence of dural sinus thrombosis.   CT Head W/O Contrast 3/29 0250 -Unchanged left brainstem hemorrhage extending into the fourth ventricle. Worsening hydrocephalus.  Diagnostic Cerebral Angiogram - No evidence of an aneurysm, AVM, dural AV fistula or other vascular abnormality to explain patient's known intraparenchymal hemorrhage.  2D Echo- EF:55-60%. No wall motion abnormalities.  LDL- 88  HgbA1c5.7  VTE prophylaxis -subcu heparin  No antithromboticprior to admission, now on Aspirin 81 mg. Follow up in clinic regarding further use of antiplatelet vs. anticoagulation.  Therapy recommendations:CIR  Disposition:Pending  Obstructive hydrocephalus   EVD placed on 3/30   CT 4/1 showed clot in right lateral ventricle around catheter.   S/p 1mg  (1cc) alteplase placed in EVD 09/29/20 -> drain well ->ICP measuring 4-11 with a good waveform  CT head (4/3) resolution of most of the IVH and improved hydrocephalus.  EVD stopped working 4/2 overnight. 0.5mg  alteplase placed in EVD 10/01/2020 -> Post alteplase CT head new hemorrhage along the EVD tract an in the right lateral ventricle.  CT repeat 4/4 - decreased IVH and decompressed right lateral ventricle  CT repeat 4/5 -mild interval enlargement of ventricular size, no significant hydrocephalus.  IVH stable.  HCT 4/7 stable, with no appreciable change in overall ventricular size  HCT repeat today was stable  MRI pending  Cerebral Edema  PICC line placement 4/3 for hypertonic saline solution  Off 3% saline now -> Free water added: 6/3 q 4 hours  Allow Na gradually trending down  Sodium level  147->148->146->148->145->152->155->157->156->155->153  Acute Respiratory Failure  Required intubation on 4/4 due to worsening MS and hypoxia  S/p trach 10/04/20  Remains on mech vent  Off precedex  Trach oozing significantly yesterday evening but improved after trauma surgery repositioned trach tube.  CCM managing  Aspiration Pneumonia Community-acquired pneumonia with Moraxella Fever   Tmax 101.1->102.4->103.6  Sputum culture (3/30): Moraxella  Sputum culture 4/5: neg  Ceftriaxone  dc'ed 4/1, 4/1-4/8 Unasyn 3g q6hr  WBC 10.7->16.3->11.0->9.7->10.3->10.6->12.4  CXR 4/7, 4/8 left basilar opacities  Fever work up today with blood cultures, UA, Resp culture, UA neg  CCM to broaden CNS antibiotic coverage to Vanc and cefepime  If no improving, consider LP for CSF for ventriculitis and stool for C.diff   PICC removed now  CCM following  AKI, resolved  Creatinine 0.74 -> 1.81->1.14->1.08->0.97->1.0  On FW and tube feeding  CCM on board  Close monitoring  SIRPIDs Severe encephalopathy   Left UE intermittent jerking with stimulation  LTM EEG: GPEDs with triphasic  wave, generalized SIRPIDs, although focal seizure is in DDx  S/p keppra 10/03/20 -> d/c concerning for angioedema   Now on vimpat 10/04/20  EEG also showed moderate to severe diffuse encephalopathy - likely related to fever, pneumonia   Remains minimally responsive off sedation  MRI brain 4/8 -stable IVH, midbrain ICH and right frontal EVD tract hemorrhage.  Ventricular size stable.  2-3 punctate infarcts left frontal and parietal lobes, corresponding to small acute/subacute infarcts.  Angioedema   Upper lip edema, improving  Concerning for keppra allergy  DDx including the ET tube biting on the upper lip  Now s/p trach - continue to monitor  D/c keppra - switched to vimpat  Hypertension  Home meds:Diltiazem  Currently on Diltiazem 30 mg q6  BP goal Systolic < 160  Long-term BP  goal normotensive  Aflutter and Afib  Not on [facility] before admission  S/p CTI ablation for A flutter  Currently on ASA  Not AC candidate at this time.  Afib with RVR overnight 4/6  Follow up in clinic regarding further use of antiplatelet vs. anticoagulation.  Hyperlipidemia  Home meds:none  LDL88, goal < 70  Addstatin once hemorrhage is stable  Continue statin at discharge  Dysphagia  Secondary to hemorrhagic stroke  NPO  SLP on board  Now with neurologic decline will likely require PEG  Tube feeds @ 10ml/hr and FW 150 Q4  Other Stroke Risk Factors  Advanced Age >/= 71  Snoring: sleep study denied by insurance prior to admission   Other Active Problems:  Hypokalemia  This plan of care was discussed with an directed by Dr. Roda Shutters.   Janey Genta, NP-C   ATTENDING NOTE: I reviewed above note and agree with the assessment and plan. Pt was seen and examined.   Patient still unresponsive, on ventilation, not following commands, not open eyes.  Overnight continued to have high-grade fever, A. fib RVR.  CT repeat in a.m. showed stable ICH, IVH, SAH, no hydrocephalus.  EVD dislodged during transportation.  Dr. Jake Samples neurosurgery felt no EVD needed at this time given no hydrocephalus.  Intermittent A. fib RVR on Cardizem.  Long-term EEG still more consistent with SIRPEDs, continued on Vimpat.  Angioedema slightly improved, off Keppra.  Repeat MRI brain showed no large infarct to account for mental status.  Given improving ICH, IVH, no large infarct, patient current severe encephalopathy most likely due to high-grade fever and source of infection.  Discussed with Dr. Jake Samples and Dr. Denese Killings today, will broaden antibiotics to include CNS coverage with cefepime and vancomycin.  Continue further infectious work-up including UA, urine culture, sputum culture.  If no improvement, will need to check CSF to rule out ventriculitis and a stool sample to rule out C.  difficile.  For detailed assessment and plan, please refer to above as I have made changes wherever appropriate.   Marvel Plan, MD PhD Stroke Neurology 10/06/2020 8:17 PM  This patient is critically ill due to severe encephalopathy, high-grade fever, SIRPEDs, ICH, IVH, hydrocephalus and at significant risk of neurological worsening, death form sepsis, septic shock, encephalopathy, status epilepticus. This patient's care requires constant monitoring of vital signs, hemodynamics, respiratory and cardiac monitoring, review of multiple databases, neurological assessment, discussion with family, other specialists and medical decision making of high complexity. I spent 50 minutes of neurocritical care time in the care of this patient.

## 2020-10-06 NOTE — Progress Notes (Signed)
LTM maint complete - re prep Fp1 all other leads not requiring service at this time.

## 2020-10-06 NOTE — Progress Notes (Addendum)
Brief Progress Note  4/8 UA 10/06/2020>> UA>> Hazy, moderate HGB, negative Nitrites, Bacteria present>> rare, Protein >> 100, Glucose 50  Will culture urine, creatinine is 1.0, Protein and glucose in UA. I have increased SS coverage for elevated CBG's HGB A1C was 5.7 on admission Will add Foley cath to prevent further I&O  Will add back SQ heparin, will not add ASA back.  Vanc and Cefepime have been added to broaden ABX spectrum. Consider CSF Culture if no improvement   Bevelyn Ngo, MSN, AGACNP-BC [facility] See Amion for personal pager PCCM on call pager [phone]

## 2020-10-06 NOTE — Procedures (Addendum)
Patient Name:Walter [name] Walter [name] Epilepsy Attending:Yaniel Limbaugh Annabelle Harman Referring Physician/Provider:Dr Judie Bonus Bhagat Duration:10/05/2020 0730 to 10/06/2020 0730  Patient history:55 old male with left upper extremity shaking. EEG done for seizures.  Level of alertness: awake, asleep  AEDs during EEG study:Vimpat  Technical aspects: This EEG study was done with scalp electrodes positioned according to the 10-20 International system of electrode placement. Electrical activity was acquired at a sampling rate of 500Hz  and reviewed with a high frequency filter of 70Hz  and a low frequency filter of 1Hz . EEG data were recorded continuously and digitally stored.   Description:No clear posterior dominant rhythm was seen. EEG showed continuous generalized 3 to 6 Hz theta-delta slowing.Intermittent generalized rhythmic periodic discharges with triphasic morphology were noted at 1.5 to 2.5 Hz pedominantly during patient arousal. Hyperventilation and photic stimulation were not performed.  Event button was pressed on 10/05/2020 at 1510 for left upper extremity twitching. Concomitant eeg showed generalizedperiodic discharges with triphasic morphology were noted at 1.5 to2Hz .   Event button was pressed on 10/06/2020 at 0030 for unclear reasons. Concomitant eeg showed generalizedperiodic discharges with triphasic morphology were noted at 1.5 to2Hz .    Event button was pressed on 10/06/2020 at 0619 for bilateral left>right upper extremity twitching after suctioing. Concomitant eeg showed generalizedperiodic discharges with triphasic morphology were noted at 1.5 to2Hz .    ABNORMALITY -Continuous slow, generalized - Stimulation induced rhythmic periodic ictal-interictal discharges ( SIRPIDS), generalized  IMPRESSION:  This studyshowed stimulation induced rhythmic periodic ictal-interictal discharges ( SIRPIDS) at 1.5 to 2.5 Hz which is on the ictal-interictal continuum with low to  intermediate potential for seizures. Additionally there is evidence of moderate to severe diffuse encephalopathy, nonspecific etiology.   Event button was pressed on 10/05/2020 at 1510 for left upper extremity twitching and on 10/06/2020 at 0619 for bilateral left>right upper extremity twitching after suctioing. Concomitant eeg showed generalizedperiodic discharges with triphasic morphology at 1.5 to 2.5 Hz without definite evolution. These could be focal motor seizures vs stimulation induced myoclonus as noted on previous day.  [name] 12/06/2020

## 2020-10-06 NOTE — Progress Notes (Signed)
LTM EEG discontinued - no skin breakdown at [facility]. Pt is going to MRI. Pt may need rehook after MRI

## 2020-10-06 NOTE — Progress Notes (Signed)
Inpatient Diabetes Program Recommendations  AACE/ADA: New Consensus Statement on Inpatient Glycemic Control (2015)  Target Ranges:  Prepandial:   less than 140 mg/dL      Peak postprandial:   less than 180 mg/dL (1-2 hours)      Critically ill patients:  140 - 180 mg/dL   Lab Results  Component Value Date   GLUCAP 213 (H) 10/06/2020   HGBA1C 5.7 (H) 09/25/2020    Review of Glycemic Control Results for [name], [name]" (MRN [id number]) as of 10/06/2020 09:52  Ref. Range 10/05/2020 07:16 10/05/2020 12:05 10/05/2020 16:51 10/05/2020 19:29 10/05/2020 23:24 10/06/2020 03:38 10/06/2020 07:56  Glucose-Capillary Latest Ref Range: 70 - 99 mg/dL 680 (H) 321 (H) 224 (H) 204 (H) 219 (H) 170 (H) 213 (H)   Diabetes history: None  Current orders for Inpatient glycemic control:  Lantus 6 units Daily Novolog 0-15 units Q4 hours  A1c 5.7 Vital 1.5 65 ml/hour  Inpatient Diabetes Program Recommendations:    - Add Novolog 2 units Q4 hours for tube feed coverage  Thanks,  Christena Deem RN, MSN, BC-ADM Inpatient Diabetes Coordinator Team Pager [phone] (8a-5p)

## 2020-10-07 DIAGNOSIS — I629 Nontraumatic intracranial hemorrhage, unspecified: Secondary | ICD-10-CM | POA: Diagnosis not present

## 2020-10-07 DIAGNOSIS — G9340 Encephalopathy, unspecified: Secondary | ICD-10-CM | POA: Diagnosis not present

## 2020-10-07 LAB — GLUCOSE, CAPILLARY
Glucose-Capillary: 149 mg/dL — ABNORMAL HIGH (ref 70–99)
Glucose-Capillary: 179 mg/dL — ABNORMAL HIGH (ref 70–99)
Glucose-Capillary: 194 mg/dL — ABNORMAL HIGH (ref 70–99)
Glucose-Capillary: 126 mg/dL — ABNORMAL HIGH (ref 70–99)
Glucose-Capillary: 155 mg/dL — ABNORMAL HIGH (ref 70–99)
Glucose-Capillary: 136 mg/dL — ABNORMAL HIGH (ref 70–99)

## 2020-10-07 LAB — CBC
HCT: 34.9 % — ABNORMAL LOW (ref 39.0–52.0)
Hemoglobin: 11.1 g/dL — ABNORMAL LOW (ref 13.0–17.0)
MCH: 32.3 pg (ref 26.0–34.0)
MCHC: 31.8 g/dL (ref 30.0–36.0)
MCV: 101.5 fL — ABNORMAL HIGH (ref 80.0–100.0)
Platelets: 118 10*3/uL — ABNORMAL LOW (ref 150–400)
RBC: 3.44 MIL/uL — ABNORMAL LOW (ref 4.22–5.81)
RDW: 14.6 % (ref 11.5–15.5)
WBC: 11.4 10*3/uL — ABNORMAL HIGH (ref 4.0–10.5)
nRBC: 0.2 % (ref 0.0–0.2)

## 2020-10-07 LAB — BLOOD CULTURE ID PANEL (REFLEXED) - BCID2

## 2020-10-07 LAB — COMPREHENSIVE METABOLIC PANEL
ALT: 64 U/L — ABNORMAL HIGH (ref 0–44)
AST: 38 U/L (ref 15–41)
Albumin: 2.3 g/dL — ABNORMAL LOW (ref 3.5–5.0)
Alkaline Phosphatase: 43 U/L (ref 38–126)
Anion gap: 7 (ref 5–15)
BUN: 28 mg/dL — ABNORMAL HIGH (ref 8–23)
CO2: 26 mmol/L (ref 22–32)
Calcium: 8.5 mg/dL — ABNORMAL LOW (ref 8.9–10.3)
Chloride: 118 mmol/L — ABNORMAL HIGH (ref 98–111)
Creatinine, Ser: 0.91 mg/dL (ref 0.61–1.24)
GFR, Estimated: >60 mL/min (ref >60)
Glucose, Bld: 163 mg/dL — ABNORMAL HIGH (ref 70–99)
Potassium: 4.1 mmol/L (ref 3.5–5.1)
Sodium: 151 mmol/L — ABNORMAL HIGH (ref 135–145)
Total Bilirubin: 1.2 mg/dL (ref 0.3–1.2)
Total Protein: 5 g/dL — ABNORMAL LOW (ref 6.5–8.1)

## 2020-10-07 LAB — URINE CULTURE: Culture: NO GROWTH

## 2020-10-07 LAB — MAGNESIUM: Magnesium: 2.2 mg/dL (ref 1.7–2.4)

## 2020-10-07 MED ORDER — NUTRISOURCE FIBER PO PACK
1.0000 | PACK | Freq: Two times a day (BID) | ORAL | Status: DC
  Administered 2020-10-07 – 2020-10-19 (×25): 1
  Filled 2020-10-07 (×28): qty 1

## 2020-10-07 MED ORDER — MEPERIDINE HCL 25 MG/ML IJ SOLN
25.0000 mg | Freq: Once | INTRAMUSCULAR | Status: AC
  Administered 2020-10-07: 25 mg via INTRAVENOUS
  Filled 2020-10-07: qty 1

## 2020-10-07 MED ORDER — FENTANYL 2500MCG IN NS 250ML (10MCG/ML) PREMIX INFUSION
0.0000 ug/h | INTRAVENOUS | Status: DC
  Administered 2020-10-07 – 2020-10-09 (×2): 50 ug/h via INTRAVENOUS
  Administered 2020-10-10: 100 ug/h via INTRAVENOUS
  Filled 2020-10-07 (×4): qty 250

## 2020-10-07 NOTE — Progress Notes (Signed)
PHARMACY - PHYSICIAN COMMUNICATION CRITICAL VALUE ALERT - BLOOD CULTURE IDENTIFICATION (BCID)  Walter [name] is an 65 y.o. male who presented to [facility] on 09/25/2020 with a chief complaint of right-sided weakness, slurred speech, HA and vomiting.  Found to have ICH.  EVD removed 4/8/222.  Assessment:  Has been on Unasyn for aspiration PNA, Moraxella on TA culture and possible meningitis.  Patient has persistent fever and elevated WBC, so antibiotic broadened to vancomycin and cefepime on 10/06/20.  Blood culture is growing Gram negative diplococci; BCID did not detect any pathogen.  Name of physician (or Provider) Contacted: Dr. Denese Killings  Current antibiotics: vancomycin and cefepime  Changes to prescribed antibiotics recommended:  Recommendations accepted by provider >> D/C vanc and continue cefepime  Results for orders placed or performed during the hospital encounter of 09/25/20  Blood Culture ID Panel (Reflexed) (Collected: 10/06/2020 11:08 AM)  Result Value Ref Range   Enterococcus faecalis NOT DETECTED NOT DETECTED   Enterococcus Faecium NOT DETECTED NOT DETECTED   Listeria monocytogenes NOT DETECTED NOT DETECTED   Staphylococcus species NOT DETECTED NOT DETECTED   Staphylococcus aureus (BCID) NOT DETECTED NOT DETECTED   Staphylococcus epidermidis NOT DETECTED NOT DETECTED   Staphylococcus lugdunensis NOT DETECTED NOT DETECTED   Streptococcus species NOT DETECTED NOT DETECTED   Streptococcus agalactiae NOT DETECTED NOT DETECTED   Streptococcus pneumoniae NOT DETECTED NOT DETECTED   Streptococcus pyogenes NOT DETECTED NOT DETECTED   A.calcoaceticus-baumannii NOT DETECTED NOT DETECTED   Bacteroides fragilis NOT DETECTED NOT DETECTED   Enterobacterales NOT DETECTED NOT DETECTED   Enterobacter cloacae complex NOT DETECTED NOT DETECTED   Escherichia coli NOT DETECTED NOT DETECTED   Klebsiella aerogenes NOT DETECTED NOT DETECTED   Klebsiella oxytoca NOT DETECTED NOT DETECTED    Klebsiella pneumoniae NOT DETECTED NOT DETECTED   Proteus species NOT DETECTED NOT DETECTED   Salmonella species NOT DETECTED NOT DETECTED   Serratia marcescens NOT DETECTED NOT DETECTED   Haemophilus influenzae NOT DETECTED NOT DETECTED   Neisseria meningitidis NOT DETECTED NOT DETECTED   Pseudomonas aeruginosa NOT DETECTED NOT DETECTED   Stenotrophomonas maltophilia NOT DETECTED NOT DETECTED   Candida albicans NOT DETECTED NOT DETECTED   Candida auris NOT DETECTED NOT DETECTED   Candida glabrata NOT DETECTED NOT DETECTED   Candida krusei NOT DETECTED NOT DETECTED   Candida parapsilosis NOT DETECTED NOT DETECTED   Candida tropicalis NOT DETECTED NOT DETECTED   Cryptococcus neoformans/gattii NOT DETECTED NOT DETECTED    Masai Kidd D. Laney Potash, PharmD, BCPS, BCCCP 10/07/2020, 2:02 PM

## 2020-10-07 NOTE — Progress Notes (Signed)
Pt shivering despite meds, MD notified, fent gtt initiated.

## 2020-10-07 NOTE — Progress Notes (Signed)
EEG unhook done, no skin break seen

## 2020-10-07 NOTE — Procedures (Addendum)
Patient Name:Walter [name] DGU:[id number] Epilepsy Attending:Mikala Podoll Annabelle Harman Referring Physician/Provider:Dr Brooke Dare Duration:4/8/20220730 to 10/07/2020 1159  Patient history:55 old male with left upper extremity shaking. EEG done for seizures.  Level of alertness: awake, asleep  AEDs during EEG study:Vimpat  Technical aspects: This EEG study was done with scalp electrodes positioned according to the 10-20 International system of electrode placement. Electrical activity was acquired at a sampling rate of 500Hz  and reviewed with a high frequency filter of 70Hz  and a low frequency filter of 1Hz . EEG data were recorded continuously and digitally stored.   Description:No clear posterior dominant rhythm was seen. EEG showed continuous generalized 3 to 6 Hz theta-delta slowing.Intermittent generalizedrhythmicperiodic discharges with triphasic morphology were noted at 1.5 to 2.5 Hzpedominantly during patient arousal, stimulation. Hyperventilation and photic stimulation were not performed.   ABNORMALITY -Continuous slow, generalized -Stimulation induced rhythmic periodicictal-interictaldischarges( SIRPIDS), generalized  IMPRESSION:  This studyshowedstimulation induced rhythmic periodicictal-interictaldischarges( SIRPIDS)at 1.5 to 2.5 Hz which is on the ictal-interictal continuum. The morphology and frequency of discharges, reactivity to stimulation and absence of evolution are more likley suggestive of interictal nature of these discharges/SIRPIDS. Additionally there is evidence of moderate to severe diffuse encephalopathy, nonspecific etiology.No definite seizures were seen during this study.  EEG appears similar to previous day.  [name] 

## 2020-10-07 NOTE — Progress Notes (Signed)
NAME:  YOHANCE HATHORNE, MRN:  [id number], DOB:  1955-07-12, LOS: 12 ADMISSION DATE:  09/25/2020, CONSULTATION DATE:  09/25/20 REFERRING MD:  Wilford Corner,  CHIEF COMPLAINT:  Headache, r side weakness, slurred speech  History of Present Illness:  Mr. Mcglocklin is a 65 yo man with hx of afib not on anticoagulation admitted with right-sided weakness, hemorrhagic stroke. Emergently intubated in the ED for airway protection  Pertinent  Medical History  Aflutter/afib (not on ac) S/p Ablation 03/2020, hx of Cardioversion 2015 Hx retinal detachment Covid infection in July 2021 treated with monoclonal antibody Hx CV   Significant Hospital Events: Including procedures, antibiotic start and stop dates in addition to other pertinent events   . 3/28- Admit, intubated.  Not a candidate for TPA due to intracranial hemorrhage.  Started 3% saline . 3/29- EVD placement for developing hydrocephalus. CTA of the head was unremarkable . 3/30- Extubated. . 4/1 respiratory culture grew Moraxella, on ceftriaxone . 4/4 reintubated because of hypoxia and unresponsiveness . 4/6 tracheostomy . 4/7 still encephalopathic  . 4/8 remains encephalopathic  Interim History / Subjective:   More interactive with fever control. Tremulousness better with Precedex.   Objective   Blood pressure (!) 86/61, pulse 89, temperature 99.3 F (37.4 C), resp. rate 20, height 6\' 2"  (1.88 m), weight 104.7 kg, SpO2 100 %.    Vent Mode: PRVC FiO2 (%):  [30 %-60 %] 30 % Set Rate:  [20 bmp] 20 bmp Vt Set:  [650 mL] 650 mL PEEP:  [5 cmH20] 5 cmH20 Pressure Support:  [10 cmH20] 10 cmH20 Plateau Pressure:  [15 cmH20-23 cmH20] 23 cmH20   Intake/Output Summary (Last 24 hours) at 10/07/2020 2200 Last data filed at 10/07/2020 2000 Gross per 24 hour  Intake 2737.44 ml  Output 1550 ml  Net 1187.44 ml   Filed Weights   10/02/20 0400 10/06/20 0400 10/07/20 0308  Weight: 93.2 kg 102.4 kg 104.7 kg    Examination: Gen:     Ill appearing older  adult M trach/vent, NAD  HEENT:  EVD in place minimal  drainage. Anicteric sclera. Trach secure, no  oozing around stoma.  Lungs:    Bilateral chest excursion, Coarse breath  sounds,  Mechanically ventilated.  CV:       s1s2 no rgm cap refill brisk , No JVD, some upper extremity edema Abd:     Soft round nd. + bowel sounds  X 4 quads, Loose stool per Flexiseal Ext:   No acute joint deformity no cyanosis or clubbing. Toenail fungus.  Skin:     Warm no rash, no lesions, clean, dry and intact, ? Cellulitis L knee cap area Neuro:   L side antigravity. Purposeful movement L arm, Winces to pain. PERRL. + gag, cough, corneals   Labs/imaging that I havepersonally reviewed  (right click and "Reselect all SmartList Selections" daily)   CT shows no worsening hydrocephalus, improving hemorrhage Cultures negative - GN diplococci likely contaminant.. Improving sodium Stable leukocytosis.  Resolved Hospital Problem list   AKI   Assessment & Plan:   Acute midbrain/dorsal pons/ thalamic IPH  Acute obstructive hydrocephalus s/p EVD  Acute encephalopathy Acute respiratory failure S/p tracheostomy  Pyrexia source unclear Afib - currently sinus Hypernatremia  Thrombocytopenia, mild  Urinary retention Continues to require I&O cath Q 6  Plan:  - continue current active temperature management - continue current antibiotics - will cover CSF infection if present. - passed SBT - TCT today - open ended.  Best Practice (right click and "Reselect  all SmartList Selections" daily)   Diet:  Tube Feed  Pain/Anxiety/Delirium protocol (if indicated): RASS goal 0 VAP protocol (if indicated): Yes DVT prophylaxis: SCD GI prophylaxis: PPI Glucose control:  SSI Yes Central venous access:  Yes, and it is no longer needed if able to get PIVs  Arterial line:  N/A Foley:  Yes, and it is no longer needed-- will trial dc 4/7  Mobility:  bed rest  PT consulted: Yes Code Status:  full code Prognosis:  Guarded Last date of multidisciplinary goals of care discussion 58/5 Spoke with patient's daughter and wife, explained situation, they would like to proceed with tracheostomy and continue aggressive care   CRITICAL CARE Performed by: Lynnell Catalan  Total critical care time: 40 minutes  Critical care time was exclusive of separately billable procedures and treating other patients. Critical care was necessary to treat or prevent imminent or life-threatening deterioration.  Critical care was time spent personally by me on the following activities: development of treatment plan with patient and/or surrogate as well as nursing, discussions with consultants, evaluation of patient's response to treatment, examination of patient, obtaining history from patient or surrogate, ordering and performing treatments and interventions, ordering and review of laboratory studies, ordering and review of radiographic studies, pulse oximetry and re-evaluation of patient's condition.  Lynnell Catalan, MD [facility] Physician [facility]  Pager: [phone] Or Epic Secure Chat After hours: [phone].  10/07/2020, 10:31 PM

## 2020-10-07 NOTE — Progress Notes (Signed)
Shivering improved.

## 2020-10-07 NOTE — Progress Notes (Addendum)
STROKE TEAM PROGRESS NOTE  Significant Events: 3/29 s/p EVD 3/30 extubated  4/4 Required re-intubation for resp failure with hypoxia and unresponsiveness. Repeat CT showed clearance of IVH at third ventricle and upper lateral ventricles. Decompressed right lateral ventricle.  4/5 Seizure activity suspected, repeat HCT stable, Keppra started. IVC catheter drainage stopped and was flushed 4/6 Keppra changed to Vimpat d/t possible allergy (?lip angioedema). Afib with RVR overnight. Febrile to 102.4. Remains encephalopathic off sedation.  4/7 oozing from trach site. Remains encephalopathic off sedation.  4/9 continuous eeg discontinued, remains encephalopathic off sedation   INTERVAL HISTORY Continues to be febrile to 101, SBP in the past 24 hours 110-160. Tachypnea 20s.   Stable leukocytosis and anemia.  He remains minimally responsive/encephalopathic. Continuous EEG discontinued today- continued to show SIRPIDS + generalized slowing.   His daughter and son-in-law are at the bedside.  MRI scan of the brain yesterday showed similar appearance of intraventricular hemorrhage as well as hemorrhage along the EVD tract in the right frontal lobe with stable ventricular size.  Small acute infarcts in left frontal and parietal lobes appear new as well in addition. Vitals:   10/07/20 0500 10/07/20 0600 10/07/20 0700 10/07/20 0759  BP: 109/68 118/61 130/77   Pulse: 89 79 81 74  Resp: 19 20 18 20   Temp: (!) 100.94 F (38.3 C) 100.04 F (37.8 C) 100.22 F (37.9 C)   TempSrc:      SpO2: 99% 99% 99% 99%  Weight:      Height:       CBC:  Recent Labs  Lab 10/03/20 2035 10/04/20 0602 10/06/20 0538 10/07/20 0236  WBC 9.7   < > 12.4* 11.4*  NEUTROABS 7.8*  --   --   --   HGB 11.5*   < > 11.7* 11.1*  HCT 36.1*   < > 36.4* 34.9*  MCV 101.1*   < > 100.3* 101.5*  PLT 125*   < > 120* 118*   < > = values in this interval not displayed.   Basic Metabolic Panel:  Recent Labs  Lab 10/03/20 2035  10/04/20 0602 10/06/20 0538 10/06/20 1108 10/06/20 1449 10/06/20 1845 10/07/20 0236  NA 154*   < > 155*   < > 153*  --  151*  K 3.9   < > 4.4  --   --   --  4.1  CL 127*   < > 120*  --   --   --  118*  CO2 28   < > 28  --   --   --  26  GLUCOSE 190*   < > 203*  --   --   --  163*  BUN 27*   < > 22  --   --   --  28*  CREATININE 1.08   < > 1.00  --   --   --  0.91  CALCIUM 8.0*   < > 8.6*  --   --   --  8.5*  MG 2.5*   < >  --    < >  --  2.3 2.2  PHOS 2.9  --   --   --   --   --   --    < > = values in this interval not displayed.   Lipid Panel:  No results for input(s): CHOL, TRIG, HDL, CHOLHDL, VLDL, LDLCALC in the last 168 hours. HgbA1c:  No results for input(s): HGBA1C in the last 168 hours. Urine Drug  Screen:  No results for input(s): LABOPIA, COCAINSCRNUR, LABBENZ, AMPHETMU, THCU, LABBARB in the last 168 hours.  Alcohol Level  No results for input(s): ETH in the last 168 hours.  IMAGING  MRI Brain 4/8 1. Similar appearance of intraventricular hemorrhage as well as hemorrhage along prior EVD tract in the right frontal lobe and scattered cortical subarachnoid hemorrhage. 2. Ventricular size remains stable. 3. No significant change in size of intraparenchymal hemorrhage centered within the left side of the midbrain extending into the thalamic pulvinar and superior cerebellar peduncle. 4. A few small foci of restricted diffusion within the left frontal and parietal lobes, corresponding to small acute/subacute infarcts.  CT head 4/8 1. Unchanged appearance of intraparenchymal and subarachnoid hemorrhage, predominantly along the course of the EVD catheter that has now been removed. 2. Unchanged dorsal left midbrain intraparenchymal hemorrhage.  CT head 4/6 1. Ventricle size shows mild interval enlargement since yesterday. No significant hydrocephalus. Intraventricular hemorrhage stable 2. Right frontal ventricular drain unchanged. Hemorrhage surrounding the drain  unchanged. 3. Subarachnoid hemorrhage unchanged. Hemorrhage in the left tectum unchanged. No new hemorrhage identified.  CT head 4/4 1. Interval decrease in intraventricular hemorrhage with decreased size of the lateral ventricles as compared to prior, right greater than left. Right parietal approach ventriculostomy in stable position with tip near the septum pellucidum. 2. Stable small volume subarachnoid and intraparenchymal hemorrhage along the catheter tract. 3. Stable 1.9 cm hemorrhage at the left dorsal pons. 4. No other new acute intracranial abnormality. 09/29/2020  CT head 4/3 IMPRESSION: 1. New intraventricular hemorrhage in the third and lateral ventricles. Fourth ventricular and midbrain hemorrhage is unchanged. 2. New right frontal EVD which traverses the right lateral ventricle. Blood clot encompasses the lower catheter and ventriculomegaly is similar to prior. 3. Small cortical infarcts are newly seen along the superior left frontal convexity.  09/26/2020 IR Angio IMPRESSION: 1. No evidence of an AVM, dural arteriovenous fistula, aneurysm or other vascular abnormality to explain patient's brainstem hemorrhage.  09/26/2020 CT head:  IMPRESSION: 1. Unchanged left brainstem hemorrhage extending into the fourth ventricle. 2. Worsening hydrocephalus.  09/25/2020 MRI brain w/wo, MR Venogram IMPRESSION: 1. Acute hemorrhage centered within the left dorsal midbrain extending into the dorsal pons and inferomedial thalamus with intraventricular extension. No abnormal enhancement to suggest underlying lesion. 2. Compression of the cerebral aqueduct with possible mild obstructive hydrocephalus. 3. No evidence of dural sinus thrombosis. Attenuation of the left basal vein in the quadrigeminal cistern without definite occlusion.  09/25/2020 CT Angio head/neck IMPRESSION: 1. No vascular malformation, anomaly or aneurysm. 2. No emergent large vessel occlusion or  high-grade stenosis of the intracranial arteries. 3. Unchanged size of dorsal left pontine hemorrhage with intraventricular extension.  09/25/2020 CT head code stroke IMPRESSION: Acute intraparenchymal hemorrhage in the dorsal left pons with subarachnoid extension into the cerebral aqueduct and fourth Ventricle.  EEG-LTM 4/5-4/6  This study showed periodic discharges with triphasic morphology at 1.5 to 2.5 Hz which is on the ictal-interictal continuum with low to intermediate potential for seizures.  Additionally there is evidence of moderate to severe diffuse encephalopathy, nonspecific etiology.  No seizures were seen during the study    PHYSICAL EXAM Blood pressure 130/77, pulse 74, temperature 100.22 F (37.9 C), resp. rate 20, height 6\' 2"  (1.88 m), weight 104.7 kg, SpO2 99 %.  General: Elderly caucasian male, critically ill, s/p tracheostomy Sedated.  Surgical staples right frontal.. Angio edema of upper lip improving.  Lungs: Symmetrical Chest rise, no labored breathing Cardio: Irregular Abdomen: Soft,  non-tender  Neurological Exam :  S/p tracheostomy Keeps eyes closed.  Eyes in mid position. PERRL. Positive doll's eyes,  Minimal corneal responsiveness.  + blink to threat on left, absent on right. Facial symmetric, + cough during head turning   Motor: Spontaneously moving left upper and lower extremity without evident purpose and not in response to commands or stimuli.  No withdrawal to noxious stimuli right upper extremity but does withdraw right lower extremity slightly.  Tone and bulk:normal tone throughout; no atrophy noted  ASSESSMENT/PLAN [name] is a 65 y.o. male with history of atrial flutter newly diagnosed with atrial fibrillation in February 2022 not on anticoagulation, follows with Dr. Johney Frame in cardiology.  He presented with right-sided weakness, slurred speech, headache, and vomiting.Last known well when he went to bed at 10 PM on 09/24/2020 and woke  up at around 2 AM on the day of admission with slurred speech.  EMS was called.He was very drowsy upon their assessment and notable right side weakeness.  He had worsening mental status, GCS 5, so was emergently intubated in ED for airway protection. His MRI brain showed acute hemorrhage at left midbrain extending into pons with IVH associated with obstructive hydrocephalus.    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 New small left frontal and parietal infarcts on MRI 10/06/2020 likely from atrial fibrillation and anticoagulation on hold due to intracerebral hemorrhage  Code Stroke CT head-Acute IPH in the dorsal left pons with subarachnoid extension into the cerebral aqueduct and fourth ventricle.  CTA head & neck-No vascular malformation, anomaly or aneurysm. No emergent large vessel occlusion or high-grade stenosis. Unchanged size of dorsal left pontine hemorrhage with intraventricular extension.  MRI& MR Venogram-Acute hemorrhage centered within the left dorsal midbrain extending into the dorsal pons and inferomedial thalamus with intraventricular extension. Compression of the cerebral aqueduct with possible mild obstructive hydrocephalus. No evidence of dural sinus thrombosis.   CT Head W/O Contrast 3/29 0250 -Unchanged left brainstem hemorrhage extending into the fourth ventricle. Worsening hydrocephalus.  Diagnostic Cerebral Angiogram - No evidence of an aneurysm, AVM, dural AV fistula or other vascular abnormality to explain patient's known intraparenchymal hemorrhage.  2D Echo- EF:55-60%. No wall motion abnormalities.  LDL- 88  HgbA1c5.7  VTE prophylaxis -subcu heparin  No antithromboticprior to admission, now on Aspirin 81 mg. Follow up in  clinic regarding further use  of antiplatelet vs. anticoagulation.  Therapy recommendations:CIR  Disposition:Pending  Obstructive hydrocephalus   EVD placed on 3/30   CT 4/1 showed clot in right lateral ventricle around catheter.   S/p 1mg  (1cc) alteplase placed in EVD 09/29/20 -> drain well ->ICP measuring 4-11 with a good waveform  CT head (4/3) resolution of most of the IVH and improved hydrocephalus.  EVD stopped working 4/2 overnight. 0.5mg  alteplase placed in EVD 10/01/2020 -> Post alteplase CT head new hemorrhage along the EVD tract an in the right lateral ventricle.  CT repeat 4/4 - decreased IVH and decompressed right lateral ventricle  CT repeat 4/5 -mild interval enlargement of ventricular size, no significant hydrocephalus.  IVH stable.  HCT 4/7 stable, with no appreciable change in overall ventricular size  HCT 4/8 repeat stable  Cerebral Edema  PICC line placement 4/3 for hypertonic saline solution  Off 3% saline now -> Free water added: 6/3 q 4 hours  Allow Na gradually trending down  Sodium level 147->148->146->148->145->152->155->157->156->155->153-> 151  Acute Respiratory Failure  Required intubation on 4/4 due to worsening MS and hypoxia  S/p trach 10/04/20  Remains on mech vent  Off precedex  Trach oozing significantly yesterday evening but improved after trauma surgery  repositioned trach tube.  CCM managing  Aspiration Pneumonia Community-acquired pneumonia with Moraxella Fever   Tmax 101.1->102.4->103.6  Sputum culture (3/30): Moraxella  Sputum culture 4/5: neg  Ceftriaxone  dc'ed 4/1, 4/1-4/8 Unasyn 3g q6hr  WBC 10.7->16.3->11.0->9.7->10.3->10.6->12.4  CXR 4/7, 4/8 left basilar opacities  Fever work up today with blood cultures, UA, Resp culture, UA neg  CCM broadened abx coverage to Vanc and Cefepime   PICC removed now  CCM following  AKI, resolved  Creatinine 0.74 -> 1.81->1.14->1.08->0.97->1.0-> 0.91  On FW and tube feeding  CCM on  board  Close monitoring  SIRPIDs Severe encephalopathy   Left UE intermittent jerking with stimulation  LTM EEG: GPEDs with triphasic wave, generalized SIRPIDs, although focal seizure is in DDx  S/p keppra 10/03/20 -> d/c concerning for angioedema   Now on vimpat 10/04/20  EEG also showed moderate to severe diffuse encephalopathy - likely related to fever, pneumonia   Remains minimally responsive off sedation  MRI brain 4/8 -stable IVH, midbrain ICH and right frontal EVD tract hemorrhage.   Ventricular size stable.  2-3 punctate infarcts left frontal and parietal lobes,  corresponding to small acute/subacute infarcts.  Angioedema   Upper lip edema, improving  Concerning for keppra allergy  DDx including the ET tube biting on the upper lip  Now s/p trach - continue to monitor  D/c keppra - switched to vimpat  Hypertension  Home meds:Diltiazem  Currently on Diltiazem 30 mg q6  BP goal Systolic < 160  Long-term BP goal normotensive  Aflutter and Afib  Not on [facility] before admission  S/p CTI ablation for A flutter  Currently on ASA  Not AC candidate at this time.  Afib with RVR overnight 4/6  Follow up in clinic regarding further use of antiplatelet vs. anticoagulation.  Hyperlipidemia  Home meds:none  LDL88, goal < 70  Addstatin once hemorrhage is stable  Continue statin at discharge  Dysphagia  Secondary to hemorrhagic stroke  NPO  SLP on board  Now with neurologic decline will likely require PEG  Tube feeds @ 84ml/hr and FW 150 Q4  [id number] New left parietal and frontal infarcts noted on MRI 10/06/2020.  Likely from underlying atrial fibrillation not on anticoagulation.  Continue aspirin and will have to hold anticoagulation for at least 2 to 4 weeks given his intracerebral hemorrhage. Other Stroke Risk Factors  Advanced Age >/= 31  Snoring: sleep study denied by insurance prior to admission   Other Active  Problems:  Hypokalemia  Patient seen and discussed with attending physician Dr. Raye Sorrow, NP-C  I have personally obtained history,examined this patient, reviewed notes, independently viewed imaging studies, participated in medical decision making and plan of care.ROS completed by me personally and pertinent positives fully documented  I have made any additions or clarifications directly to the above note. Agree with note above.  Patient neurological exam remains poor likely multifactorial due to combination of his previous intracerebral hemorrhage with MRI now showing new small left hemispheric infarcts as well as his high fever and possibly underlying urinary tract infection.  I had a long discussion the patient daughter and son-in-law at the bedside and with Dr. Denese Killings.  Continue antibiotics as per CCM team.  Wean off  ventilatory support to trach collar as tolerated.  Hopefully if fever improves and infection is treated he may become more responsive.  Discontinue long-term EEG as no clear evidence of ongoing seizure activity is noted on overnight monitoring.  Continue Vimpat and the current dose. This patient is critically ill and at significant risk of neurological worsening, death and care requires constant monitoring of vital signs, hemodynamics,respiratory and cardiac monitoring, extensive review of multiple databases, frequent neurological assessment, discussion with family, other specialists and medical decision making of high complexity.I have made any additions or clarifications directly to the above note.This critical care time does not reflect procedure time, or teaching time or supervisory time of PA/NP/Med Resident etc but could involve care discussion time.  I spent 40 minutes of neurocritical care time  in the care of  this patient.      Delia Heady, MD Medical Director [facility] Pager: [phone] 10/07/2020 3:52 PM

## 2020-10-07 NOTE — Progress Notes (Addendum)
Cant keep temp at prescribed goal with conventional cooling blkt, ice, tylenol sedation etc. Progressed to arctic sun normothermia per order.

## 2020-10-07 NOTE — Progress Notes (Signed)
Pt at goal temp of 37.0

## 2020-10-07 NOTE — Progress Notes (Signed)
Pt having continued shivers despite demerol. Start precedex drip effectiveness should occur around 0.3 but do not exceed 0.5 per Dr. Benjamin Stain.

## 2020-10-08 ENCOUNTER — Inpatient Hospital Stay (HOSPITAL_COMMUNITY): Payer: Medicare Other

## 2020-10-08 DIAGNOSIS — R509 Fever, unspecified: Secondary | ICD-10-CM

## 2020-10-08 DIAGNOSIS — I613 Nontraumatic intracerebral hemorrhage in brain stem: Secondary | ICD-10-CM | POA: Diagnosis not present

## 2020-10-08 DIAGNOSIS — I629 Nontraumatic intracranial hemorrhage, unspecified: Secondary | ICD-10-CM | POA: Diagnosis not present

## 2020-10-08 HISTORY — PX: IR IVC FILTER PLMT / S&I /IMG GUID/MOD SED: IMG701

## 2020-10-08 LAB — GLUCOSE, CAPILLARY
Glucose-Capillary: 123 mg/dL — ABNORMAL HIGH (ref 70–99)
Glucose-Capillary: 124 mg/dL — ABNORMAL HIGH (ref 70–99)
Glucose-Capillary: 126 mg/dL — ABNORMAL HIGH (ref 70–99)
Glucose-Capillary: 156 mg/dL — ABNORMAL HIGH (ref 70–99)
Glucose-Capillary: 165 mg/dL — ABNORMAL HIGH (ref 70–99)
Glucose-Capillary: 166 mg/dL — ABNORMAL HIGH (ref 70–99)

## 2020-10-08 LAB — BASIC METABOLIC PANEL
Anion gap: 8 (ref 5–15)
BUN: 35 mg/dL — ABNORMAL HIGH (ref 8–23)
CO2: 25 mmol/L (ref 22–32)
Calcium: 8.5 mg/dL — ABNORMAL LOW (ref 8.9–10.3)
Chloride: 117 mmol/L — ABNORMAL HIGH (ref 98–111)
Creatinine, Ser: 1 mg/dL (ref 0.61–1.24)
GFR, Estimated: >60 mL/min (ref >60)
Glucose, Bld: 132 mg/dL — ABNORMAL HIGH (ref 70–99)
Potassium: 4.9 mmol/L (ref 3.5–5.1)
Sodium: 150 mmol/L — ABNORMAL HIGH (ref 135–145)

## 2020-10-08 LAB — C DIFFICILE (CDIFF) QUICK SCRN (NO PCR REFLEX)
C Diff antigen: NEGATIVE
C Diff interpretation: NOT DETECTED
C Diff toxin: NEGATIVE

## 2020-10-08 MED ORDER — IOHEXOL 300 MG/ML  SOLN
100.0000 mL | Freq: Once | INTRAMUSCULAR | Status: AC | PRN
  Administered 2020-10-08: 40 mL via INTRAVENOUS

## 2020-10-08 MED ORDER — FREE WATER
200.0000 mL | Status: DC
  Administered 2020-10-08 – 2020-10-13 (×29): 200 mL

## 2020-10-08 MED ORDER — BROMOCRIPTINE MESYLATE 2.5 MG PO TABS
2.5000 mg | ORAL_TABLET | Freq: Two times a day (BID) | ORAL | Status: DC
  Administered 2020-10-08 (×2): 2.5 mg
  Filled 2020-10-08 (×3): qty 1

## 2020-10-08 MED ORDER — LIDOCAINE HCL 1 % IJ SOLN
INTRAMUSCULAR | Status: AC
  Filled 2020-10-08: qty 20

## 2020-10-08 MED ORDER — LACOSAMIDE 50 MG PO TABS
100.0000 mg | ORAL_TABLET | Freq: Two times a day (BID) | ORAL | Status: DC
  Administered 2020-10-08 – 2020-10-11 (×7): 100 mg
  Filled 2020-10-08 (×7): qty 2

## 2020-10-08 NOTE — Progress Notes (Signed)
NAME:  Walter [name], MRN:  [id number], DOB:  1955-07-06, LOS: 13 ADMISSION DATE:  09/25/2020, CONSULTATION DATE:  09/25/20 REFERRING MD:  Wilford Corner,  CHIEF COMPLAINT:  Headache, r side weakness, slurred speech  History of Present Illness:  Mr. Meester is a 65 yo man with hx of afib not on anticoagulation admitted with right-sided weakness, hemorrhagic stroke. Emergently intubated in the ED for airway protection  Pertinent  Medical History  Aflutter/afib (not on ac) S/p Ablation 03/2020, hx of Cardioversion 2015 Hx retinal detachment Covid infection in July 2021 treated with monoclonal antibody Hx CV   Significant Hospital Events: Including procedures, antibiotic start and stop dates in addition to other pertinent events   . 3/28- Admit, intubated.  Not a candidate for TPA due to intracranial hemorrhage.  Started 3% saline . 3/29- EVD placement for developing hydrocephalus. CTA of the head was unremarkable . 3/30- Extubated. . 4/1 respiratory culture grew Moraxella, on ceftriaxone . 4/4 reintubated because of hypoxia and unresponsiveness . 4/6 tracheostomy . 4/7 still encephalopathic  . 4/8 remains encephalopathic  Interim History / Subjective:   More interactive with fever control. Tremulousness better with Precedex.  He is to require active fever management.  Neurologically improved significantly when fever is well controlled  Objective   Blood pressure 115/60, pulse 61, temperature 99 F (37.2 C), temperature source Rectal, resp. rate (!) 23, height 6\' 2"  (1.88 m), weight 107.8 kg, SpO2 100 %.    Vent Mode: PRVC FiO2 (%):  [30 %-60 %] 30 % Set Rate:  [20 bmp] 20 bmp Vt Set:  [650 mL] 650 mL PEEP:  [5 cmH20] 5 cmH20 Plateau Pressure:  [21 cmH20-23 cmH20] 21 cmH20   Intake/Output Summary (Last 24 hours) at 10/08/2020 1031 Last data filed at 10/08/2020 0900 Gross per 24 hour  Intake 3235.3 ml  Output 1000 ml  Net 2235.3 ml   Filed Weights   10/06/20 0400 10/07/20 0308  10/08/20 0400  Weight: 102.4 kg 104.7 kg 107.8 kg    Examination: Gen:     Ill appearing older adult M trach/vent, NAD  HEENT:  EVD in place minimal  drainage. Anicteric sclera. Trach secure, no  oozing around stoma.  Lungs:    Bilateral chest excursion, Coarse breath  sounds,  Mechanically ventilated.  CV:       s1s2 no rgm cap refill brisk , No JVD, some upper extremity edema Abd:     Soft round nd. + bowel sounds  X 4 quads, Loose stool per Flexiseal Ext:   No acute joint deformity no cyanosis or clubbing. Toenail fungus.  Skin:     Warm no rash, no lesions, clean, dry and intact, no evidence of cellulitis.  Line sites intact. Neuro:   L side antigravity. Purposeful movement L arm, Winces to pain.  Shivering with stimulation PERRL. + gag, cough, corneals   Labs/imaging that I havepersonally reviewed  (right click and "Reselect all SmartList Selections" daily)   CT shows no worsening hydrocephalus, improving hemorrhage Cultures negative - GN diplococci likely contaminant.. Improving sodium Stable leukocytosis.  Resolved Hospital Problem list   AKI   Assessment & Plan:   Acute midbrain/dorsal pons/ thalamic IPH  Acute obstructive hydrocephalus s/p EVD  Acute encephalopathy Acute respiratory failure S/p tracheostomy  Pyrexia source unclear Afib - currently sinus Hypernatremia  Thrombocytopenia, mild  Urinary retention   Plan:  - continue current active temperature management. - continue current antibiotics - will cover CSF infection if present. -Increase treatment for possible  central fever -We will test for C. difficile -Rule out DVT -Trach collar trial when able.  Will likely need to be on less sedation.  Best Practice (right click and "Reselect all SmartList Selections" daily)   Diet:  Tube Feed  Pain/Anxiety/Delirium protocol (if indicated): RASS goal -2/-3 VAP protocol (if indicated): Yes DVT prophylaxis: SCD GI prophylaxis: PPI Glucose control:  SSI  Yes Central venous access:  N/A Arterial line:  N/A Foley:  Yes, and it is no longer needed-- will trial dc 4/7  Mobility:  bed rest  PT consulted: Yes Code Status:  full code Prognosis: Guarded Last date of multidisciplinary goals of care discussion 4/10 indicated that patient's fever persists likely central in origin.  Currently has antibiotic coverage for all possibilities.  Likely simply needs time.   CRITICAL CARE Performed by: Lynnell Catalan  Total critical care time: 40 minutes  Critical care time was exclusive of separately billable procedures and treating other patients. Critical care was necessary to treat or prevent imminent or life-threatening deterioration.  Critical care was time spent personally by me on the following activities: development of treatment plan with patient and/or surrogate as well as nursing, discussions with consultants, evaluation of patient's response to treatment, examination of patient, obtaining history from patient or surrogate, ordering and performing treatments and interventions, ordering and review of laboratory studies, ordering and review of radiographic studies, pulse oximetry and re-evaluation of patient's condition.  Lynnell Catalan, MD [facility] ICU Physician [facility]  Pager: [phone] Or Epic Secure Chat After hours: [phone].  10/08/2020, 10:31 AM

## 2020-10-08 NOTE — Procedures (Signed)
Interventional Radiology Procedure Note  Procedure: IVC filter placement  Findings: Please refer to procedural dictation for full description. Right IJ access, Denali filter placed at infrarenal location.  Complications: None  Estimated Blood Loss: < 5 mL  Recommendations: Will arrange for follow up in IR clinic in 3 months to assess for candidacy of filter retrieval.   Marliss Coots, MD Pager: [phone]

## 2020-10-08 NOTE — Sedation Documentation (Addendum)
IVC filter placement complete. . Fentanyl drip and Precedex drip infusing, no additional sedation medications given for procedure. Vital signs stable

## 2020-10-08 NOTE — Progress Notes (Addendum)
STROKE TEAM PROGRESS NOTE  Significant Events: 3/29 s/p EVD 3/30 extubated  4/4 Required re-intubation for resp failure with hypoxia and unresponsiveness. Repeat CT showed clearance of IVH at third ventricle and upper lateral ventricles. Decompressed right lateral ventricle.  4/5 Seizure activity suspected, repeat HCT stable, Keppra started. IVC catheter drainage stopped and was flushed 4/6 Keppra changed to Vimpat d/t possible allergy (?lip angioedema). Afib with RVR overnight. Febrile to 102.4. Remains encephalopathic off sedation.  4/7 oozing from trach site. Remains encephalopathic off sedation.  4/9 continuous eeg discontinued, remains encephalopathic off sedation  4/9 remains encephalopathic however nursing noted overnight when fever broke he followed commands for a five minute period   INTERVAL HISTORY Family at bedside this morning, updated on care plan. He has not fevered since 1547 on 4/9.  He did not do well with the tracheostomy collar trial yesterday.  He is on temperature management protocol and on sedation to prevent shivering insulin neurological exam is limited Vitals:   10/08/20 0700 10/08/20 0800 10/08/20 0814 10/08/20 0900  BP: 99/62   (!) 117/59  Pulse: (!) 54  63 64  Resp: (!) 0  (!) 21 20  Temp: 98.6 F (37 C) 99 F (37.2 C)  99.3 F (37.4 C)  TempSrc:  Rectal  Rectal  SpO2: 100%  100% 100%  Weight:      Height:       CBC:  Recent Labs  Lab 10/03/20 2035 10/04/20 0602 10/06/20 0538 10/07/20 0236  WBC 9.7   < > 12.4* 11.4*  NEUTROABS 7.8*  --   --   --   HGB 11.5*   < > 11.7* 11.1*  HCT 36.1*   < > 36.4* 34.9*  MCV 101.1*   < > 100.3* 101.5*  PLT 125*   < > 120* 118*   < > = values in this interval not displayed.   Basic Metabolic Panel:  Recent Labs  Lab 10/03/20 2035 10/04/20 0602 10/06/20 1845 10/07/20 0236 10/08/20 0132  NA 154*   < >  --  151* 150*  K 3.9   < >  --  4.1 4.9  CL 127*   < >  --  118* 117*  CO2 28   < >  --  26 25   GLUCOSE 190*   < >  --  163* 132*  BUN 27*   < >  --  28* 35*  CREATININE 1.08   < >  --  0.91 1.00  CALCIUM 8.0*   < >  --  8.5* 8.5*  MG 2.5*   < > 2.3 2.2  --   PHOS 2.9  --   --   --   --    < > = values in this interval not displayed.   Lipid Panel:  No results for input(s): CHOL, TRIG, HDL, CHOLHDL, VLDL, LDLCALC in the last 168 hours. HgbA1c:  No results for input(s): HGBA1C in the last 168 hours. Urine Drug Screen:  No results for input(s): LABOPIA, COCAINSCRNUR, LABBENZ, AMPHETMU, THCU, LABBARB in the last 168 hours.  Alcohol Level  No results for input(s): ETH in the last 168 hours.  IMAGING  MRI Brain 4/8 1. Similar appearance of intraventricular hemorrhage as well as hemorrhage along prior EVD tract in the right frontal lobe and scattered cortical subarachnoid hemorrhage. 2. Ventricular size remains stable. 3. No significant change in size of intraparenchymal hemorrhage centered within the left side of the midbrain extending into the thalamic pulvinar  and superior cerebellar peduncle. 4. A few small foci of restricted diffusion within the left frontal and parietal lobes, corresponding to small acute/subacute infarcts.  CT head 4/8 1. Unchanged appearance of intraparenchymal and subarachnoid hemorrhage, predominantly along the course of the EVD catheter that has now been removed. 2. Unchanged dorsal left midbrain intraparenchymal hemorrhage.  CT head 4/6 1. Ventricle size shows mild interval enlargement since yesterday. No significant hydrocephalus. Intraventricular hemorrhage stable 2. Right frontal ventricular drain unchanged. Hemorrhage surrounding the drain unchanged. 3. Subarachnoid hemorrhage unchanged. Hemorrhage in the left tectum unchanged. No new hemorrhage identified.  CT head 4/4 1. Interval decrease in intraventricular hemorrhage with decreased size of the lateral ventricles as compared to prior, right greater than left. Right parietal approach  ventriculostomy in stable position with tip near the septum pellucidum. 2. Stable small volume subarachnoid and intraparenchymal hemorrhage along the catheter tract. 3. Stable 1.9 cm hemorrhage at the left dorsal pons. 4. No other new acute intracranial abnormality. 09/29/2020  CT head 4/3 IMPRESSION: 1. New intraventricular hemorrhage in the third and lateral ventricles. Fourth ventricular and midbrain hemorrhage is unchanged. 2. New right frontal EVD which traverses the right lateral ventricle. Blood clot encompasses the lower catheter and ventriculomegaly is similar to prior. 3. Small cortical infarcts are newly seen along the superior left frontal convexity.  09/26/2020 IR Angio IMPRESSION: 1. No evidence of an AVM, dural arteriovenous fistula, aneurysm or other vascular abnormality to explain patient's brainstem hemorrhage.  09/26/2020 CT head:  IMPRESSION: 1. Unchanged left brainstem hemorrhage extending into the fourth ventricle. 2. Worsening hydrocephalus.  09/25/2020 MRI brain w/wo, MR Venogram IMPRESSION: 1. Acute hemorrhage centered within the left dorsal midbrain extending into the dorsal pons and inferomedial thalamus with intraventricular extension. No abnormal enhancement to suggest underlying lesion. 2. Compression of the cerebral aqueduct with possible mild obstructive hydrocephalus. 3. No evidence of dural sinus thrombosis. Attenuation of the left basal vein in the quadrigeminal cistern without definite occlusion.  09/25/2020 CT Angio head/neck IMPRESSION: 1. No vascular malformation, anomaly or aneurysm. 2. No emergent large vessel occlusion or high-grade stenosis of the intracranial arteries. 3. Unchanged size of dorsal left pontine hemorrhage with intraventricular extension.  09/25/2020 CT head code stroke IMPRESSION: Acute intraparenchymal hemorrhage in the dorsal left pons with subarachnoid extension into the cerebral aqueduct and  fourth Ventricle.  EEG-LTM 4/5-4/6  This study showed periodic discharges with triphasic morphology at 1.5 to 2.5 Hz which is on the ictal-interictal continuum with low to intermediate potential for seizures.  Additionally there is evidence of moderate to severe diffuse encephalopathy, nonspecific etiology.  No seizures were seen during the study  PHYSICAL EXAM Blood pressure (!) 117/59, pulse 64, temperature 99.3 F (37.4 C), temperature source Rectal, resp. rate 20, height 6\' 2"  (1.88 m), weight 107.8 kg, SpO2 100 %.  General: Elderly caucasian male, critically ill, s/p tracheostomy Sedated.  Surgical staples right frontal.. Angio edema of upper lip improving.  Lungs: Symmetrical Chest rise, no labored breathing Cardio: Irregular Abdomen: Soft, non-tender  Neurological Exam :  S/p tracheostomy Keeps eyes closed.  Eyes in mid position. PERRL. Positive doll's eyes,  Minimal corneal responsiveness.  + blink to threat on left, absent on right. Facial symmetric, + cough during head turning   Motor: When noxious stimulation was applied to left lower extremity he withdrew in left upper and lower  No withdrawal to noxious stimuli right upper extremity but does withdraw right lower extremity slightly.  Tone and bulk:normal tone throughout; no atrophy noted  ASSESSMENT/PLAN Mr. Lynann BeaverSteven L [name] is a 65 y.o. male with history of atrial flutter newly diagnosed with atrial fibrillation in February 2022 not on anticoagulation, follows with Dr. Johney FrameAllred in cardiology.  He presented with right-sided weakness, slurred speech, headache, and vomiting.Last known well when he went to bed at 10 PM on 09/24/2020 and woke up at around 2 AM on the day of admission with slurred speech.  EMS was called.He was very drowsy upon their assessment and notable right side weakeness. He had worsening mental status, GCS 5, so was emergently intubated in ED for airway protection. His MRI brain showed acute hemorrhage at left  midbrain extending into pons with IVH associated with obstructive hydrocephalus.    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 New small left frontal and parietal infarcts on MRI 10/06/2020 likely from atrial fibrillation and anticoagulation on hold due to intracerebral hemorrhage  Code Stroke CT head-Acute IPH in the dorsal left pons with subarachnoid extension into the cerebral aqueduct and fourth ventricle.  CTA head & neck-No vascular malformation, anomaly or aneurysm. No emergent large vessel occlusion or high-grade stenosis. Unchanged size of dorsal left pontine hemorrhage with intraventricular extension.  MRI& MR Venogram-Acute hemorrhage centered within the left dorsal midbrain extending into the dorsal pons and inferomedial thalamus with intraventricular extension. Compression of the cerebral aqueduct with possible mild obstructive hydrocephalus. No evidence of dural sinus thrombosis.   CT Head W/O Contrast 3/29 0250 -Unchanged left brainstem hemorrhage extending into the fourth ventricle. Worsening hydrocephalus.  Diagnostic Cerebral Angiogram - No evidence of an aneurysm, AVM, dural AV fistula or other vascular abnormality to explain patient's known intraparenchymal hemorrhage.  2D Echo- EF:55-60%. No wall motion abnormalities.  LDL- 88  HgbA1c5.7  VTE prophylaxis -subcu heparin  No antithromboticprior to admission, now on Aspirin 81 mg. Follow up in  clinic regarding further use of antiplatelet vs. anticoagulation.  Therapy recommendations:CIR  Disposition:Pending  Obstructive hydrocephalus   EVD placed on 3/30   CT 4/1 showed clot in right lateral ventricle around catheter.   S/p 1mg  (1cc) alteplase placed in EVD 09/29/20 -> drain well ->ICP measuring 4-11  with a good waveform  CT head (4/3) resolution of most of the IVH and improved hydrocephalus.  EVD stopped working 4/2 overnight. 0.5mg  alteplase placed in EVD 10/01/2020 -> Post alteplase CT head new hemorrhage along the EVD tract an in the right lateral ventricle.  CT repeat 4/4 - decreased IVH and decompressed right lateral ventricle  CT repeat 4/5 -mild interval enlargement of ventricular size, no significant hydrocephalus.  IVH stable.  HCT 4/7 stable, with no appreciable change in overall ventricular size  HCT 4/8 repeat stable  Cerebral Edema  PICC line placement 4/3 for hypertonic saline solution  Off 3% saline now -> Free water added: 150ml q 4 hours  Allow Na gradually trending down  Sodium level 147->148->146->148->145->152->155->157->156->155->153->  151-> 150  Acute Respiratory Failure  Required intubation on 4/4 due to worsening MS and hypoxia  S/p trach 10/04/20  Remains on mech vent  Off precedex  Trach oozing significantly yesterday evening but improved after trauma surgery  repositioned trach tube.  CCM managing  Aspiration Pneumonia Community-acquired pneumonia with Moraxella Fever   Tmax 101.1->102.4->103.6  Sputum culture (3/30): Moraxella  Sputum culture 4/5: neg  Ceftriaxone  dc'ed 4/1, 4/1-4/8 Unasyn 3g q6hr  WBC 10.7->16.3->11.0->9.7->10.3->10.6->12.4  CXR 4/7, 4/8  left basilar opacities  Fever work up today with blood cultures, UA, Resp culture, UA neg  CCM broadened abx coverage to Vanc and Cefepime, have deferred on  doing LP as current treatment covers hypothetical CSF infection    CCM following  AKI, resolved  Creatinine 0.74 -> 1.81->1.14->1.08->0.97->1.0-> 0.91  On FW and tube feeding  CCM on board  Close monitoring  SIRPIDs Severe encephalopathy   Left UE intermittent jerking with stimulation  LTM EEG: GPEDs with triphasic wave, generalized SIRPIDs, although focal seizure is in DDx  S/p keppra 10/03/20 -> d/c  concerning for angioedema   Now on vimpat 10/04/20  EEG also showed moderate to severe diffuse encephalopathy - likely related to fever, pneumonia   Remains minimally responsive off sedation  MRI brain 4/8 -stable IVH, midbrain ICH and right frontal EVD tract hemorrhage.   Ventricular size stable.  2-3 punctate infarcts left frontal and parietal lobes,  corresponding to small acute/subacute infarcts.  Angioedema   Upper lip edema, improving  Concerning for keppra allergy  DDx including the ET tube biting on the upper lip  Now s/p trach - continue to monitor  D/c keppra - switched to vimpat  Hypertension  Home meds:Diltiazem  Currently on Diltiazem 30 mg q6  BP goal Systolic < 160  Long-term BP goal normotensive  Aflutter and Afib  Not on [facility] before admission  S/p CTI ablation for A flutter  Currently on ASA  Not AC candidate at this time.  Afib with RVR overnight 4/6  Follow up in clinic regarding further use of antiplatelet vs. anticoagulation.  Hyperlipidemia  Home meds:none  LDL88, goal < 70  Addstatin once hemorrhage is stable  Continue statin at discharge  Dysphagia  Secondary to hemorrhagic stroke  NPO  SLP on board  Now with neurologic decline will likely require PEG  Tube feeds @ 92ml/hr and FW 150 Q4  [id number] New left parietal and frontal infarcts noted on MRI 10/06/2020.  Likely from underlying atrial fibrillation not on anticoagulation.  Continue aspirin and will have to hold anticoagulation for at least 2 to 4 weeks given his intracerebral hemorrhage. New bilateral lower extremity DVT as per lower extremity ultrasound on 10/08/2020 Other Stroke Risk Factors  Advanced Age >/= 65  Snoring: sleep study denied by insurance prior to admission   Other Active Problems:  Hypokalemia  Patient seen and discussed with attending physician Dr. Raye Sorrow, NP-C  I have personally obtained history,examined this  patient, reviewed notes, independently viewed imaging studies, participated in medical decision making and plan of care.ROS completed by me personally and pertinent positives fully documented  I have made any additions or clarifications directly to the above note. Agree with note above.  Patient neurological exam continues to be poor likely multifactorial due to combination of his previous intracerebral hemorrhage with MRI now showing new small left hemispheric infarcts as well as his high fever and possibly underlying urinary tract infection.  I had a long discussion the patient wife and daughter at the bedside and with Dr. Denese Killings.  Continue antibiotics as per CCM team.  Wean off ventilatory support to trach collar as tolerated.  Hopefully if fever improves and infection is treated he may become more responsive. . Patient has bilateral DVT but unfortunately very cannot use full anticoagulation given his recent intracerebral hemorrhage.  Recommend IVC filter placement by interventional radiology.  Continue Vimpat and the current dose. This patient is critically ill and at significant risk of  neurological worsening, death and care requires constant monitoring of vital signs, hemodynamics,respiratory and cardiac monitoring, extensive review of multiple databases, frequent neurological assessment, discussion with family, other specialists and medical decision making of high complexity.I have made any additions or clarifications directly to the above note.This critical care time does not reflect procedure time, or teaching time or supervisory time of PA/NP/Med Resident etc but could involve care discussion time.  I spent of neurocritical care time  in the care of  this patient.      Delia Heady, MD Medical Director [facility] Pager: [phone] 10/08/2020 9:28 AM

## 2020-10-08 NOTE — Consult Note (Signed)
Chief Complaint: Patient was seen in consultation today for IVC filter placement  Referring Physician(s): Lynnell Catalan, MD  Patient Status: [facility] - In-pt  History of Present Illness: Walter [name] is a 65 y.o. male with currently admitted for intracranial hemorrhage found to have bilateral lower extremity deep vein thrombosis.  No current clinical evidence of pulmonary embolism or phlegmasia.  Unable to arouse patient with verbal stimulation.  Past Medical History:  Diagnosis Date  . Atrial flutter (HCC)   . Dizzy 09/25/13  . Near syncope 09/25/13    Past Surgical History:  Procedure Laterality Date  . A-FLUTTER ABLATION N/A 03/09/2020   Procedure: A-FLUTTER ABLATION;  Surgeon: Hillis Range, MD;  Location: [facility] INVASIVE CV LAB;  Service: Cardiovascular;  Laterality: N/A;  . CARDIOVERSION N/A 09/27/2013   Procedure: CARDIOVERSION;  Surgeon: Lewayne Bunting, MD;  Location: [facility] ENDOSCOPY;  Service: Cardiovascular;  Laterality: N/A;  . IR ANGIO EXTERNAL CAROTID SEL EXT CAROTID UNI R MOD SED  09/26/2020  . IR ANGIO INTRA EXTRACRAN SEL COM CAROTID INNOMINATE BILAT MOD SED  09/26/2020  . IR ANGIO VERTEBRAL SEL VERTEBRAL UNI R MOD SED  09/26/2020  . IR US GUIDE VASC ACCESS RIGHT  09/26/2020  . RETINAL DETACHMENT SURGERY    . right knee arthroscopy    . TEE WITHOUT CARDIOVERSION N/A 09/27/2013   Procedure: TRANSESOPHAGEAL ECHOCARDIOGRAM (TEE);  Surgeon: Lewayne Bunting, MD;  Location: [facility] ENDOSCOPY;  Service: Cardiovascular;  Laterality: N/A;  . WRIST SURGERY      Allergies: Latex  Medications: Prior to Admission medications   Medication Sig Start Date End Date Taking? Authorizing Provider  Bioflavonoid Products (ESTER-C) 500-550 MG TABS Take 1 tablet by mouth daily.   Yes [provider]  cholecalciferol (VITAMIN D3) 25 MCG (1000 UNIT) tablet Take 1,000 Units by mouth daily.   Yes [provider]  diltiazem (CARDIZEM CD) 120 MG 24 hr capsule Take 1 capsule (120 mg  total) by mouth daily. 08/21/20  Yes Allred, Fayrene Fearing, MD  EPINEPHrine 0.3 mg/0.3 mL IJ SOAJ injection Inject 0.3 mg into the muscle once as needed for anaphylaxis. 02/07/20  Yes [provider]  diltiazem (CARDIZEM) 30 MG tablet Take 1 tablet (30 mg total) by mouth daily as needed (1-2 tablets every 4-6 hrs for fast heart rate.). Patient not taking: No sig reported 07/03/20   Hillis Range, MD     Family History  Problem Relation Age of Onset  . Heart disease Mother   . Heart disease Father   . Healthy Sister   . Healthy Brother     Social History   Socioeconomic History  . Marital status: Married    Spouse name: Not on file  . Number of children: Not on file  . Years of education: Not on file  . Highest education level: Not on file  Occupational History  . Not on file  Tobacco Use  . Smoking status: Never Smoker  . Smokeless tobacco: Never Used  Substance and Sexual Activity  . Alcohol use: Yes    Comment: occasionally  . Drug use: No  . Sexual activity: Not on file  Other Topics Concern  . Not on file  Social History Narrative   Pt lives in [place].  Works as a Copywriter, advertising for [employer].   Married. 2 children   Social Determinants of Health   Financial Resource Strain: Not on file  Food Insecurity: Not on file  Transportation Needs: Not on file  Physical Activity:  Not on file  Stress: Not on file  Social Connections: Not on file     Review of Systems: Obtained from providers and chart as patient non-communicative.  Vital Signs: BP 136/87   Pulse 71   Temp 98.8 F (37.1 C) (Rectal)   Resp (!) 22   Ht 6\' 2"  (1.88 m)   Wt 107.8 kg   SpO2 92%   BMI 30.51 kg/m   Physical Exam HENT:     Head: Normocephalic.     Mouth/Throat:     Comments: Tracheostomy in place Cardiovascular:     Rate and Rhythm: Normal rate and regular rhythm.  Pulmonary:     Effort: Pulmonary effort is normal.  Abdominal:     General: There is no distension.   Skin:    General: Skin is warm and dry.     Imaging: BLE lower extremity venous duplex (10/08/20): Acute appearing bilateral calf to femoral vein DVT  Labs:  CBC: Recent Labs    10/04/20 0602 10/05/20 0412 10/06/20 0538 10/07/20 0236  WBC 10.3 10.6* 12.4* 11.4*  HGB 11.2* 11.5* 11.7* 11.1*  HCT 35.9* 36.7* 36.4* 34.9*  PLT 121* 129* 120* 118*    COAGS: Recent Labs    09/25/20 0309  INR 1.0  APTT 27    BMP: Recent Labs    12/13/19 1155 03/02/20 0853 09/25/20 0309 10/03/20 12/03/20 10/03/20 0854 10/05/20 0412 10/05/20 0934 10/06/20 0538 10/06/20 1108 10/06/20 1449 10/07/20 0236 10/08/20 0132  NA 139 141   < > QUESTIONABLE RESULTS, RECOMMEND RECOLLECT TO VERIFY   < > 157*   < > 155* 156* 153* 151* 150*  K 4.3 4.1   < > QUESTIONABLE RESULTS, RECOMMEND RECOLLECT TO VERIFY   < > 3.4*  --  4.4  --   --  4.1 4.9  CL 102 103   < > QUESTIONABLE RESULTS, RECOMMEND RECOLLECT TO VERIFY   < > 123*  --  120*  --   --  118* 117*  CO2 27 23   < > QUESTIONABLE RESULTS, RECOMMEND RECOLLECT TO VERIFY   < > 26  --  28  --   --  26 25  GLUCOSE 103* 115*   < > QUESTIONABLE RESULTS, RECOMMEND RECOLLECT TO VERIFY   < > 208*  --  203*  --   --  163* 132*  BUN 13 13   < > QUESTIONABLE RESULTS, RECOMMEND RECOLLECT TO VERIFY   < > 22  --  22  --   --  28* 35*  CALCIUM 9.8 9.4   < > QUESTIONABLE RESULTS, RECOMMEND RECOLLECT TO VERIFY   < > 8.2*  --  8.6*  --   --  8.5* 8.5*  CREATININE 1.04 0.98   < > QUESTIONABLE RESULTS, RECOMMEND RECOLLECT TO VERIFY   < > 0.97  --  1.00  --   --  0.91 1.00  GFRNONAA >60 81   < > QUESTIONABLE RESULTS, RECOMMEND RECOLLECT TO VERIFY   < > >60  --  >60  --   --  >60 >60  GFRAA >60 94  --  QUESTIONABLE RESULTS, RECOMMEND RECOLLECT TO VERIFY  --   --   --   --   --   --   --   --    < > = values in this interval not displayed.    LIVER FUNCTION TESTS: Recent Labs    10/03/20 2035 10/04/20 0748 10/06/20 1449 10/07/20 0236  BILITOT 0.4 1.1  0.7 1.2  AST  27 40 29 38  ALT 45* 45* 61* 64*  ALKPHOS 48 51 43 43  PROT 5.4* 5.5* 5.2* 5.0*  ALBUMIN 2.5* 2.6* 2.2* 2.3*    TUMOR MARKERS: No results for input(s): AFPTM, CEA, CA199, CHROMGRNA in the last 8760 hours.  Assessment and Plan: 65 year old male with bilateral lower extremity deep vein thrombosis and contraindication to anticoagulation in the setting of intracranial hemorrhage.  Plan for IVC filter placement with moderate sedation.    Risks and benefits discussed with the patient's wife, Parry Po, including, but not limited to bleeding, infection, contrast induced renal failure, filter fracture or migration which can lead to emergency surgery or even death, strut penetration with damage or irritation to adjacent structures and caval thrombosis.  All of the spouse's questions were answered, patient is agreeable to proceed. Consent signed and in chart.   Electronically Signed: Bennie Dallas, MD 10/08/2020, 1:35 PM   I spent a total of 20 Minutesin face to face in clinical consultation, greater than 50% of which was counseling/coordinating care for IVC filter placement.

## 2020-10-08 NOTE — CV Procedure (Signed)
BLE venous duplex completed. Critical results given to Dr. Denese Killings at 1245.  Results can be found under chart review under CV PROC. 10/08/2020 1:00 PM Chelcey Caputo RVT, RDMS

## 2020-10-08 NOTE — Progress Notes (Signed)
Patient transported to IR and back without complications. RN at bedside. ?

## 2020-10-09 DIAGNOSIS — I629 Nontraumatic intracranial hemorrhage, unspecified: Secondary | ICD-10-CM | POA: Diagnosis not present

## 2020-10-09 LAB — BASIC METABOLIC PANEL
Anion gap: 5 (ref 5–15)
BUN: 36 mg/dL — ABNORMAL HIGH (ref 8–23)
CO2: 24 mmol/L (ref 22–32)
Calcium: 8.2 mg/dL — ABNORMAL LOW (ref 8.9–10.3)
Chloride: 120 mmol/L — ABNORMAL HIGH (ref 98–111)
Creatinine, Ser: 0.88 mg/dL (ref 0.61–1.24)
GFR, Estimated: >60 mL/min (ref >60)
Glucose, Bld: 167 mg/dL — ABNORMAL HIGH (ref 70–99)
Potassium: 3.9 mmol/L (ref 3.5–5.1)
Sodium: 149 mmol/L — ABNORMAL HIGH (ref 135–145)

## 2020-10-09 LAB — CBC
HCT: 27.6 % — ABNORMAL LOW (ref 39.0–52.0)
Hemoglobin: 8.7 g/dL — ABNORMAL LOW (ref 13.0–17.0)
MCH: 32.8 pg (ref 26.0–34.0)
MCHC: 31.5 g/dL (ref 30.0–36.0)
MCV: 104.2 fL — ABNORMAL HIGH (ref 80.0–100.0)
Platelets: 96 10*3/uL — ABNORMAL LOW (ref 150–400)
RBC: 2.65 MIL/uL — ABNORMAL LOW (ref 4.22–5.81)
RDW: 14.2 % (ref 11.5–15.5)
WBC: 7.6 10*3/uL (ref 4.0–10.5)
nRBC: 0.3 % — ABNORMAL HIGH (ref 0.0–0.2)

## 2020-10-09 LAB — GLUCOSE, CAPILLARY
Glucose-Capillary: 157 mg/dL — ABNORMAL HIGH (ref 70–99)
Glucose-Capillary: 173 mg/dL — ABNORMAL HIGH (ref 70–99)
Glucose-Capillary: 148 mg/dL — ABNORMAL HIGH (ref 70–99)
Glucose-Capillary: 119 mg/dL — ABNORMAL HIGH (ref 70–99)
Glucose-Capillary: 168 mg/dL — ABNORMAL HIGH (ref 70–99)

## 2020-10-09 MED ORDER — BROMOCRIPTINE MESYLATE 2.5 MG PO TABS
5.0000 mg | ORAL_TABLET | Freq: Two times a day (BID) | ORAL | Status: DC
  Administered 2020-10-09 – 2020-10-12 (×7): 5 mg
  Filled 2020-10-09 (×6): qty 2

## 2020-10-09 MED ORDER — MIDAZOLAM HCL 2 MG/2ML IJ SOLN
2.0000 mg | Freq: Once | INTRAMUSCULAR | Status: AC
  Administered 2020-10-09: 2 mg via INTRAVENOUS
  Filled 2020-10-09: qty 2

## 2020-10-09 NOTE — Progress Notes (Signed)
Occupational Therapy Treatment Patient Details Name: Walter [name] MRN: [id number] DOB: Aug 15, 1955 Today's Date: 10/09/2020    History of present illness 65 yo male presenting 3/28 with R-sided weakness and slurred speech. Imaging revealed acute hemorrhage centered within the left dorsal midbrain extending into the dorsal pons and inferomedial thalamus with intraventricular extension. EVD placement for developing hydrocephalus on 3/29. Extubated 3/30. Pt with noted decrease in EVD drainage overnight 3/31, proximal and distal clots noted, flushed, repeat CT shows significant clot in R lateral ventricle. EVD with another episode of clotting overnight 4/2, intraventricular TpA administered. Reintubated 4/4. PMH Afib, COVID infection (July 2021), and retinal detachment.   OT comments  Pt continues to present with poor arousal and occupational participation. Focused session on PROM of BUEs and edema management. Pt without grimacing or change in HR/RR with ROM and stimuli. Total A +2 for repositioning in bed. Donning splint and performed skin check; notified RN. Continue to recommend dc to SNF and will continue to follow acutely as admitted.    Follow Up Recommendations  SNF    Equipment Recommendations  Wheelchair cushion (measurements OT);Wheelchair (measurements OT);Hospital bed;Other (comment) (respiratory needs at this time)    Recommendations for Other Services Other (comment) (Palliative)    Precautions / Restrictions Precautions Precautions: Fall Required Braces or Orthoses: Other Brace Other Brace: BUE resting hand splints, see schedule       Mobility Bed Mobility               General bed mobility comments: Total A for repositioning in bed and optimizing upright posture with bed    Transfers                 General transfer comment: Defer due to arousal level    Balance Overall balance assessment: Needs assistance Sitting-balance support: No upper extremity  supported;Feet supported Sitting balance-Leahy Scale: Poor                                     ADL either performed or assessed with clinical judgement   ADL Overall ADL's : Needs assistance/impaired                                       General ADL Comments: Total A     Vision       Perception     Praxis      Cognition Arousal/Alertness: Lethargic Behavior During Therapy: Flat affect Overall Cognitive Status: Difficult to assess                                 General Comments: Poor arousal. Not opening eyes. No reaction to ROM; no change in HR or RR        Exercises Exercises: General Upper Extremity;Other exercises General Exercises - Upper Extremity Shoulder Flexion: PROM;Both;10 reps;Supine Elbow Flexion: PROM;Both;10 reps;Supine Elbow Extension: PROM;Both;10 reps;Supine Wrist Flexion: PROM;Both;10 reps;Supine Wrist Extension: PROM;Both;10 reps;Supine Digit Composite Flexion: PROM;Both;10 reps;Supine Composite Extension: PROM;Both;10 reps;Supine Other Exercises Other Exercises: cervical PROM; x5 Other Exercises: Edema management with retrograde massage and elevation   Shoulder Instructions       General Comments Donning BUE splints; notfiied RN    Pertinent Vitals/ Pain       Pain Assessment: Faces Faces Pain Scale:  No hurt Pain Intervention(s): Monitored during session  Home Living                                          Prior Functioning/Environment              Frequency  Min 2X/week        Progress Toward Goals  OT Goals(current goals can now be found in the care plan section)  Progress towards OT goals: Progressing toward goals  Acute Rehab OT Goals Patient Stated Goal: none stated, intubated OT Goal Formulation: With family Time For Goal Achievement: 10/12/20 Potential to Achieve Goals: Fair ADL Goals Pt Will Perform Grooming: with max assist;bed level Pt Will  Perform Upper Body Dressing: with max assist;bed level Pt Will Transfer to Toilet: with +2 assist;with max assist;bedside commode;stand pivot transfer Additional ADL Goal #1: pt will perform bed mobility with total +2 max (A) as precursor to adls. Additional ADL Goal #2: pt will tolerate sitting EOB 10 minutes with mod (A) as precursor to adls  Plan Discharge plan remains appropriate    Co-evaluation    PT/OT/SLP Co-Evaluation/Treatment: Yes Reason for Co-Treatment: For patient/therapist safety;To address functional/ADL transfers   OT goals addressed during session: ADL's and self-care      AM-PAC OT "6 Clicks" Daily Activity     Outcome Measure   Help from another person eating meals?: Total Help from another person taking care of personal grooming?: Total Help from another person toileting, which includes using toliet, bedpan, or urinal?: Total Help from another person bathing (including washing, rinsing, drying)?: Total Help from another person to put on and taking off regular upper body clothing?: Total Help from another person to put on and taking off regular lower body clothing?: Total 6 Click Score: 6    End of Session Equipment Utilized During Treatment: Oxygen  OT Visit Diagnosis: Unsteadiness on feet (R26.81);Other abnormalities of gait and mobility (R26.89);Muscle weakness (generalized) (M62.81);Hemiplegia and hemiparesis Hemiplegia - Right/Left: Right Hemiplegia - dominant/non-dominant: Dominant Hemiplegia - caused by: Cerebral infarction   Activity Tolerance Patient tolerated treatment well   Patient Left in bed;with call bell/phone within reach;with bed alarm set;with family/visitor present;with nursing/sitter in room;Other (comment) (Vent)   Nurse Communication Mobility status;Precautions        Time: [phone] OT Time Calculation (min): 33 min  Charges: OT General Charges $OT Visit: 1 Visit OT Treatments $Therapeutic Activity: 8-22 mins  Isabelle Matt MSOT, OTR/L Acute Rehab Pager: [phone] Office: [phone]   Theodoro Grist Tiffany Calmes 10/09/2020, 4:46 PM

## 2020-10-09 NOTE — Progress Notes (Signed)
NAME:  Walter [name], MRN:  [id number], DOB:  1955-09-22, LOS: 14 ADMISSION DATE:  09/25/2020, CONSULTATION DATE:  09/25/20 REFERRING MD:  Wilford Corner,  CHIEF COMPLAINT:  Headache, r side weakness, slurred speech  History of Present Illness:  Walter [name] is a 65 yo man with hx of afib not on anticoagulation admitted with right-sided weakness, hemorrhagic stroke. Emergently intubated in the ED for airway protection  Pertinent  Medical History  Aflutter/afib (not on ac) S/p Ablation 03/2020, hx of Cardioversion 2015 Hx retinal detachment Covid infection in July 2021 treated with monoclonal antibody   Significant Hospital Events: Including procedures, antibiotic start and stop dates in addition to other pertinent events   . 3/28- Admit, intubated.  Not a candidate for TPA due to intracranial hemorrhage.  Started 3% saline . 3/29- EVD placement for developing hydrocephalus. CTA of the head was unremarkable . 3/30- Extubated. . 4/1 respiratory culture grew Moraxella, on ceftriaxone . 4/4 reintubated because of hypoxia and unresponsiveness . 4/6 tracheostomy . 4/7 still encephalopathic  . 4/8 remains encephalopathic . 4/10 Doppler shows bilateral DVT. IVC filter placed.   Interim History / Subjective:   Fever and WBC have improved. [employer] [employer] still actively controlling temperature. Severe shivering when sedation paused.   Objective   Blood pressure (!) 103/52, pulse (!) 55, temperature 98.4 F (36.9 C), resp. rate 20, height 6\' 2"  (1.88 m), weight 106 kg, SpO2 98 %.    Vent Mode: PRVC FiO2 (%):  [30 %-40 %] 40 % Set Rate:  [20 bmp] 20 bmp Vt Set:  [650 mL] 650 mL PEEP:  [5 cmH20] 5 cmH20 Plateau Pressure:  [16 cmH20-21 cmH20] 16 cmH20   Intake/Output Summary (Last 24 hours) at 10/09/2020 0806 Last data filed at 10/09/2020 0700 Gross per 24 hour  Intake 2500.04 ml  Output 1800 ml  Net 700.04 ml   Filed Weights   10/07/20 0308 10/08/20 0400 10/09/20 0500  Weight: 104.7 kg 107.8 kg  106 kg    Examination: Gen:     Ill appearing older adult M trach/vent, NAD  HEENT: Trach secure, no  oozing around stoma.  Lungs:    Chest clear bilaterally  CV:      HS normal Abd:     Soft round nd. + bowel sounds  X 4 quads, Loose stool per Flexiseal Ext:  moderate edema.  Skin:     Warm no rash, no lesions, clean, dry and intact, no evidence of cellulitis.  Line sites intact. Neuro: Sedated with no response to painful or verbal stimulation.  Shivering with stimulation   Labs/imaging that I havepersonally reviewed  (right click and "Reselect all SmartList Selections" daily)   CT shows no worsening hydrocephalus, improving hemorrhage Cultures negative - GN diplococci likely contaminant.. Improving sodium Improving leukocytosis  Resolved Hospital Problem list   AKI   Assessment & Plan:   Acute midbrain/dorsal pons/ thalamic IPH  Acute obstructive hydrocephalus s/p EVD  Bilateral DVT's Acute encephalopathy Acute respiratory failure S/p tracheostomy  Pyrexia source unclear Afib - currently sinus Hypernatremia  Thrombocytopenia, mild  Urinary retention   Plan:  - continue current active temperature management. - continue current antibiotics - will cover CSF infection if present. -Increase treatment for possible central fever - IVC filter until 4/24 - then should be able to transition to anticoagulation and remove filter.  -Trach collar trial when able.  Will likely need to be on less sedation.  Best Practice (right click and "Reselect all SmartList Selections" daily)  Diet:  Tube Feed  on fiber for diarrhea Pain/Anxiety/Delirium protocol (if indicated): RASS goal -2/-3 VAP protocol (if indicated): Yes DVT prophylaxis: SCD GI prophylaxis: PPI Glucose control:  SSI Yes Central venous access:  N/A Arterial line:  N/A Foley:  Yes, and it is no longer needed-- will trial dc 4/7  Mobility:  bed rest  PT consulted: Yes Code Status:  full code Prognosis:  Guarded Last date of multidisciplinary goals of care discussion 4/10 indicated that patient's fever persists likely central in origin.  Currently has antibiotic coverage for all possibilities.  Likely simply needs time.   CRITICAL CARE Performed by: Lynnell Catalan  Total critical care time: 40 minutes  Critical care time was exclusive of separately billable procedures and treating other patients. Critical care was necessary to treat or prevent imminent or life-threatening deterioration.  Critical care was time spent personally by me on the following activities: development of treatment plan with patient and/or surrogate as well as nursing, discussions with consultants, evaluation of patient's response to treatment, examination of patient, obtaining history from patient or surrogate, ordering and performing treatments and interventions, ordering and review of laboratory studies, ordering and review of radiographic studies, pulse oximetry and re-evaluation of patient's condition.  Lynnell Catalan, MD [facility] ICU Physician [facility] [place] [facility]  Pager: [phone] Or Epic Secure Chat After hours: [phone].  10/09/2020, 8:06 AM

## 2020-10-09 NOTE — Progress Notes (Addendum)
STROKE TEAM PROGRESS NOTE  Significant Events: 3/29 s/p EVD 3/30 extubated  4/4 Required re-intubation for resp failure with hypoxia and unresponsiveness. Repeat CT showed clearance of IVH at third ventricle and upper lateral ventricles. Decompressed right lateral ventricle.  4/5 Seizure activity suspected, repeat HCT stable, Keppra started. IVC catheter drainage stopped and was flushed 4/6 Keppra changed to Vimpat d/t possible allergy (?lip angioedema). Afib with RVR overnight. Febrile to 102.4. Remains encephalopathic off sedation.  4/7 oozing from trach site. Remains encephalopathic off sedation.  4/9 continuous eeg discontinued, remains encephalopathic off sedation  4/9 remains encephalopathic however nursing noted overnight when fever broke he followed commands for a five minute period  4/9 remains encephalopathic, are treating shivering with fentanyl and bromocriptine   INTERVAL HISTORY Family updated at bedside this morning. Last fevered yesterday at 1800.  He is on temperature management protocol and on sedation to prevent shivering insulin neurological exam is limited but no significant changes noted Vitals:   10/09/20 0700 10/09/20 0800 10/09/20 0829 10/09/20 0900  BP: (!) 102/50 (!) 103/52  (!) 104/52  Pulse: (!) 57 (!) 55 (!) 59 62  Resp: 20 20 (!) 30 20  Temp:  98.4 F (36.9 C)    TempSrc:  Rectal    SpO2: 98% 98% 100% 98%  Weight:      Height:       CBC:  Recent Labs  Lab 10/03/20 2035 10/04/20 0602 10/07/20 0236 10/09/20 0044  WBC 9.7   < > 11.4* 7.6  NEUTROABS 7.8*  --   --   --   HGB 11.5*   < > 11.1* 8.7*  HCT 36.1*   < > 34.9* 27.6*  MCV 101.1*   < > 101.5* 104.2*  PLT 125*   < > 118* 96*   < > = values in this interval not displayed.   Basic Metabolic Panel:  Recent Labs  Lab 10/03/20 2035 10/04/20 0602 10/06/20 1845 10/07/20 0236 10/08/20 0132 10/09/20 0044  NA 154*   < >  --  151* 150* 149*  K 3.9   < >  --  4.1 4.9 3.9  CL 127*   < >  --   118* 117* 120*  CO2 28   < >  --  26 25 24   GLUCOSE 190*   < >  --  163* 132* 167*  BUN 27*   < >  --  28* 35* 36*  CREATININE 1.08   < >  --  0.91 1.00 0.88  CALCIUM 8.0*   < >  --  8.5* 8.5* 8.2*  MG 2.5*   < > 2.3 2.2  --   --   PHOS 2.9  --   --   --   --   --    < > = values in this interval not displayed.   Lipid Panel:  No results for input(s): CHOL, TRIG, HDL, CHOLHDL, VLDL, LDLCALC in the last 168 hours. HgbA1c:  No results for input(s): HGBA1C in the last 168 hours. Urine Drug Screen:  No results for input(s): LABOPIA, COCAINSCRNUR, LABBENZ, AMPHETMU, THCU, LABBARB in the last 168 hours.  Alcohol Level  No results for input(s): ETH in the last 168 hours.  IMAGING  MRI Brain 4/8 1. Similar appearance of intraventricular hemorrhage as well as hemorrhage along prior EVD tract in the right frontal lobe and scattered cortical subarachnoid hemorrhage. 2. Ventricular size remains stable. 3. No significant change in size of intraparenchymal hemorrhage centered within  the left side of the midbrain extending into the thalamic pulvinar and superior cerebellar peduncle. 4. A few small foci of restricted diffusion within the left frontal and parietal lobes, corresponding to small acute/subacute infarcts.  CT head 4/8 1. Unchanged appearance of intraparenchymal and subarachnoid hemorrhage, predominantly along the course of the EVD catheter that has now been removed. 2. Unchanged dorsal left midbrain intraparenchymal hemorrhage.  CT head 4/6 1. Ventricle size shows mild interval enlargement since yesterday. No significant hydrocephalus. Intraventricular hemorrhage stable 2. Right frontal ventricular drain unchanged. Hemorrhage surrounding the drain unchanged. 3. Subarachnoid hemorrhage unchanged. Hemorrhage in the left tectum unchanged. No new hemorrhage identified.  CT head 4/4 1. Interval decrease in intraventricular hemorrhage with decreased size of the lateral ventricles as  compared to prior, right greater than left. Right parietal approach ventriculostomy in stable position with tip near the septum pellucidum. 2. Stable small volume subarachnoid and intraparenchymal hemorrhage along the catheter tract. 3. Stable 1.9 cm hemorrhage at the left dorsal pons. 4. No other new acute intracranial abnormality. 09/29/2020  CT head 4/3 IMPRESSION: 1. New intraventricular hemorrhage in the third and lateral ventricles. Fourth ventricular and midbrain hemorrhage is unchanged. 2. New right frontal EVD which traverses the right lateral ventricle. Blood clot encompasses the lower catheter and ventriculomegaly is similar to prior. 3. Small cortical infarcts are newly seen along the superior left frontal convexity.  09/26/2020 IR Angio IMPRESSION: 1. No evidence of an AVM, dural arteriovenous fistula, aneurysm or other vascular abnormality to explain patient's brainstem hemorrhage.  09/26/2020 CT head:  IMPRESSION: 1. Unchanged left brainstem hemorrhage extending into the fourth ventricle. 2. Worsening hydrocephalus.  09/25/2020 MRI brain w/wo, MR Venogram IMPRESSION: 1. Acute hemorrhage centered within the left dorsal midbrain extending into the dorsal pons and inferomedial thalamus with intraventricular extension. No abnormal enhancement to suggest underlying lesion. 2. Compression of the cerebral aqueduct with possible mild obstructive hydrocephalus. 3. No evidence of dural sinus thrombosis. Attenuation of the left basal vein in the quadrigeminal cistern without definite occlusion.  09/25/2020 CT Angio head/neck IMPRESSION: 1. No vascular malformation, anomaly or aneurysm. 2. No emergent large vessel occlusion or high-grade stenosis of the intracranial arteries. 3. Unchanged size of dorsal left pontine hemorrhage with intraventricular extension.  09/25/2020 CT head code stroke IMPRESSION: Acute intraparenchymal hemorrhage in the dorsal left pons  with subarachnoid extension into the cerebral aqueduct and fourth Ventricle.  EEG-LTM 4/5-4/6  This study showed periodic discharges with triphasic morphology at 1.5 to 2.5 Hz which is on the ictal-interictal continuum with low to intermediate potential for seizures.  Additionally there is evidence of moderate to severe diffuse encephalopathy, nonspecific etiology.  No seizures were seen during the study  PHYSICAL EXAM Blood pressure (!) 104/52, pulse 62, temperature 98.4 F (36.9 C), temperature source Rectal, resp. rate 20, height 6\' 2"  (1.88 m), weight 106 kg, SpO2 98 %.  General: Elderly caucasian male, critically ill, s/p tracheostomy Sedated.  Surgical staples right frontal.. Angio edema of upper lip improving.  Lungs: Symmetrical Chest rise, no labored breathing Cardio: Irregular Abdomen: Soft, non-tender  Neurological Exam :  S/p tracheostomy. Keeps eyes closed. Eyes in mid position. PERRL. Positive doll's eyes, Minimal corneal responsiveness.  + blink to threat on left, absent on right. Facial symmetric, + cough during head turning   Motor: Normal bulk and tone. He does not withdraw to noxious stimulation to BUE or BLE.   ASSESSMENT/PLAN Walter [name] is a 65 y.o. male with history of atrial flutter newly  diagnosed with atrial fibrillation in February 2022 not on anticoagulation, follows with Dr. Johney Frame in cardiology.  He presented with right-sided weakness, slurred speech, headache, and vomiting.Last known well when he went to bed at 10 PM on 09/24/2020 and woke up at around 2 AM on the day of admission with slurred speech.  EMS was called.He was very drowsy upon their assessment and notable right side weakeness. He had worsening mental status, GCS 5, so was emergently intubated in ED for airway protection. His MRI brain showed acute hemorrhage at left midbrain extending into pons with IVH associated with obstructive hydrocephalus.    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  Code Stroke CT head-Acute IPH in the dorsal left pons with subarachnoid extension into the cerebral aqueduct and fourth ventricle.  CTA head & neck-No vascular malformation, anomaly or aneurysm. No emergent large vessel occlusion or high-grade stenosis. Unchanged size of dorsal left pontine hemorrhage with intraventricular extension.  MRI& MR Venogram-Acute hemorrhage centered within the left dorsal midbrain extending into the dorsal pons and inferomedial thalamus with intraventricular extension. Compression of the cerebral aqueduct with possible mild obstructive hydrocephalus. No evidence of dural sinus thrombosis.   CT Head W/O Contrast 3/29 0250 -Unchanged left brainstem hemorrhage extending into the fourth ventricle. Worsening hydrocephalus.  Diagnostic Cerebral Angiogram - No evidence of an aneurysm, AVM, dural AV fistula or other vascular abnormality to explain patient's known intraparenchymal hemorrhage.  2D Echo- EF:55-60%. No wall motion abnormalities.  LDL- 88  HgbA1c5.7  VTE prophylaxis -subcu heparin  No antithromboticprior to admission, now on Aspirin 81 mg. Follow up in  clinic regarding further use of antiplatelet vs. anticoagulation.  Therapy recommendations:CIR  Disposition:Pending  New Frontal Parietal Stroke  New left parietal and frontal infarcts noted on MRI 10/06/2020. Likely from  underlying atrial fibrillation not on anticoagulation. Continue aspirin and will have  to hold anticoagulation for at least 2 to 4 weeks given his intracerebral  hemorrhage.  Obstructive hydrocephalus   EVD placed on 3/30   CT 4/1 showed clot in right lateral ventricle around catheter.   S/p 1mg  (1cc) alteplase placed in EVD 09/29/20 -> drain well ->ICP  measuring 4-11 with a good waveform  CT head (4/3) resolution of most of the IVH and improved hydrocephalus.  EVD stopped working 4/2 overnight. 0.5mg  alteplase placed in EVD 10/01/2020 ->  Post alteplase CT head new hemorrhage along the EVD tract an in the right  lateral ventricle.  CT repeat 4/4 - decreased IVH and decompressed right lateral ventricle  CT repeat 4/5 -mild interval enlargement of ventricular size, no significant  hydrocephalus.  IVH stable.  HCT 4/7 stable, with no appreciable change in overall ventricular size  HCT 4/8 repeat stable  Cerebral Edema  PICC line placement 4/3 for hypertonic saline solution  Off 3% saline now -> Free water added: 6/3 q 4 hours  Allow Na to gradually down trend   Acute Respiratory Failure  Required intubation on 4/4 due to worsening MS and hypoxia  S/p trach 10/04/20, remains on mech vent  CCM managing  Aspiration Pneumonia Community-acquired pneumonia with Moraxella Fever   Tmax 101.1->102.4->103.6  Sputum culture (3/30): Moraxella  Sputum culture 4/5: neg  Ceftriaxone  dc'ed 4/1, 4/1-4/8 Unasyn 3g q6hr  WBC 10.7->16.3->11.0->9.7->10.3->10.6->12.4  CXR 4/7, 4/8 left basilar opacities  Fever work up today with blood cultures, UA, Resp  culture, UA neg  CCM broadened abx coverage to Vanc and Cefepime, have deferred on  doing LP as current treatment covers hypothetical CSF infection    CCM following  AKI, resolved  Monitor chemistries, Cr WNL currently   On FW and tube feeding  CCM on board  SIRPIDs Severe encephalopathy   Left UE intermittent jerking with stimulation  LTM EEG: GPEDs with triphasic wave, generalized SIRPIDs, although focal seizure is in DDx  S/p keppra 10/03/20 -> d/c concerning for angioedema   Now on vimpat 10/04/20  EEG also showed moderate to severe diffuse encephalopathy - likely related to       fever, pneumonia   Remains minimally responsive off sedation  MRI brain 4/8 -stable  IVH, midbrain ICH and right frontal EVD tract hemorrhage.   Ventricular size stable.  2-3 punctate infarcts left frontal and parietal lobes,  corresponding to small acute/subacute infarcts.  Angioedema   Upper lip edema, improving  Concerning for keppra allergy  DDx including the ET tube biting on the upper lip  Now s/p trach - continue to monitor  D/c keppra - switched to vimpat  Hypertension  Home meds:Diltiazem  Currently on Diltiazem 30 mg q6  BP goal Systolic < 160  Long-term BP goal normotensive  Aflutter and Afib  Not on [facility] before admission  S/p CTI ablation for A flutter  Currently on ASA  Not AC candidate at this time.  Afib with RVR overnight 4/6  Follow up in clinic regarding further use of antiplatelet vs. anticoagulation.  Hyperlipidemia  Home meds:none  LDL88, goal < 70  Addstatin once hemorrhage is stable  Continue statin at discharge  Dysphagia  Secondary to hemorrhagic stroke  NPO  SLP on board  Now with neurologic decline will likely require PEG  Tube feeds @ 28ml/hr and FW 150 Q4  [id number]  Heme   New bilateral lower extremity DVT as per lower extremity ultrasound on  10/08/2020. Are continuing heparin for DVT prophylaxis, can not anticoagulate at  this time due to bleed.   Other Stroke Risk Factors  Advanced Age >/= 65  Snoring: sleep study denied by insurance prior to admission   Other Active Problems:  Hypokalemia  Patient seen and discussed with attending physician Dr. Raye Sorrow, [facility] Nurse Practitioner  I have personally obtained history,examined this patient, reviewed notes, independently viewed imaging studies, participated in medical decision making and plan of care.ROS completed by me personally and pertinent positives fully documented  I have made any additions or clarifications directly to the above note. Agree with note above.      10/09/2020 6:09 PM Patient  neurological exam continues to be limited secondary to his sedation and antishivering protocol medication requirement.  I had a long discussion the patient wife and daughter at the bedside and with Dr. Denese Killings.  Continue medical management of his high temperature and shivering as per CCM team.  Recommend increase bromocriptine dose to 5 mg twice daily for central fever.  Wean off ventilatory support to trach collar as tolerated.  Hopefully if fever improves and infection is treated he may become more responsive. . Patient has bilateral DVT but unfortunately very cannot use full anticoagulation given his recent intracerebral hemorrhage.  He had placement of IVC filter yesterday by interventional radiology.  Continue Vimpat and the current dose.  Patient is too high risk for full anticoagulation at the present time given his recent intracerebral hemorrhage and still hold off for at  least 2 weeks. This patient is critically ill and at significant risk of neurological worsening, death and care requires constant monitoring of vital signs, hemodynamics,respiratory and cardiac monitoring, extensive review of multiple databases, frequent neurological assessment, discussion with family, other specialists and medical decision making of high complexity.I have made any additions or clarifications directly to the above note.This critical care time does not reflect procedure time, or teaching time or supervisory time of PA/NP/Med Resident etc but could involve care discussion time.  I spent 32 minutes of neurocritical care time  in the care of  this patient.      Delia Heady, MD Medical Director [facility] Pager: [phone] 10/09/2020 9:32 AM

## 2020-10-09 NOTE — Progress Notes (Signed)
Physical Therapy Treatment Patient Details Name: Walter [name] MRN: [id number] DOB: [birth year] Today's Date: 10/09/2020    History of Present Illness 65 yo male presenting 3/28 with R-sided weakness and slurred speech. Imaging revealed acute hemorrhage centered within the left dorsal midbrain extending into the dorsal pons and inferomedial thalamus with intraventricular extension. EVD placement for developing hydrocephalus on 3/29. Extubated 3/30. Pt with noted decrease in EVD drainage overnight 3/31, proximal and distal clots noted, flushed, repeat CT shows significant clot in R lateral ventricle. EVD with another episode of clotting overnight 4/2, intraventricular TpA administered. Reintubated 4/4. PMH Afib, COVID infection (July 2021), and retinal detachment.    PT Comments    Pt seen by PT/OT this session to safely attempt progression of mobility. Session limited by pt lethargy, lack of responsiveness to movement, changes in positioning, and stimuli. PROM to all joints with improvement in ROM at BLE, and improvement in B hand swelling following retrograde massage. The pt's vitals remained stable with all mobility, but no noted changes in response with movement. Some myoclonic jerking noted with initial mobility, but reduced in frequency with continued ROM. Pt will continue to benefit from skilled PT to progress functional mobility, strength, and activity tolerance, will continue to follow and recommend intensive therapies when pt able to tolerate.     Follow Up Recommendations  CIR     Equipment Recommendations   (defer to post acute)    Recommendations for Other Services       Precautions / Restrictions Precautions Precautions: Fall Required Braces or Orthoses: Other Brace Other Brace: BUE resting hand splints, see schedule Restrictions Weight Bearing Restrictions: No    Mobility  Bed Mobility               General bed mobility comments: Total A for repositioning in bed  and optimizing upright posture with bed    Transfers                 General transfer comment: Defer due to arousal level     Modified Rankin (Stroke Patients Only) Modified Rankin (Stroke Patients Only) Pre-Morbid Rankin Score: No symptoms Modified Rankin: Severe disability     Balance Overall balance assessment: Needs assistance Sitting-balance support: No upper extremity supported;Feet supported Sitting balance-Leahy Scale: Poor Sitting balance - Comments: totalA for all repositioning, minA to maintain midline while lying in bed                                    Cognition Arousal/Alertness: Lethargic Behavior During Therapy: Flat affect Overall Cognitive Status: Difficult to assess                                 General Comments: Poor arousal. Not opening eyes. No reaction to ROM; no change in HR or RR      Exercises General Exercises - Upper Extremity Shoulder Flexion: PROM;Both;10 reps;Supine Elbow Flexion: PROM;Both;10 reps;Supine Elbow Extension: PROM;Both;10 reps;Supine Wrist Flexion: PROM;Both;10 reps;Supine Wrist Extension: PROM;Both;10 reps;Supine Digit Composite Flexion: PROM;Both;10 reps;Supine Composite Extension: PROM;Both;10 reps;Supine General Exercises - Lower Extremity Ankle Circles/Pumps: PROM;Both;15 reps;Supine Heel Slides: PROM;Both;10 reps;Supine Other Exercises Other Exercises: cervical PROM; x5 Other Exercises: Edema management with retrograde massage and elevation    General Comments General comments (skin integrity, edema, etc.): edema in BUE, retrograde massage and BUE splints applied to pt  Pertinent Vitals/Pain Pain Assessment: Faces Faces Pain Scale: No hurt Pain Intervention(s): Monitored during session           PT Goals (current goals can now be found in the care plan section) Acute Rehab PT Goals Patient Stated Goal: none stated, intubated PT Goal Formulation: With patient Time  For Goal Achievement: 10/16/20 Potential to Achieve Goals: Fair Progress towards PT goals: Not progressing toward goals - comment (pt remains lethargic with no active response)    Frequency    Min 4X/week      PT Plan Current plan remains appropriate    Co-evaluation PT/OT/SLP Co-Evaluation/Treatment: Yes Reason for Co-Treatment: Complexity of the patient's impairments (multi-system involvement);For patient/therapist safety PT goals addressed during session: Mobility/safety with mobility;Strengthening/ROM OT goals addressed during session: ADL's and self-care      AM-PAC PT "6 Clicks" Mobility   Outcome Measure  Help needed turning from your back to your side while in a flat bed without using bedrails?: Total Help needed moving from lying on your back to sitting on the side of a flat bed without using bedrails?: Total Help needed moving to and from a bed to a chair (including a wheelchair)?: Total Help needed standing up from a chair using your arms (e.g., wheelchair or bedside chair)?: Total Help needed to walk in hospital room?: Total Help needed climbing 3-5 steps with a railing? : Total 6 Click Score: 6    End of Session Equipment Utilized During Treatment: Oxygen (vent trach) Activity Tolerance: Patient limited by fatigue;Patient limited by lethargy Patient left: in bed;with call bell/phone within reach;with bed alarm set;with SCD's reapplied Nurse Communication: Mobility status PT Visit Diagnosis: Hemiplegia and hemiparesis;Other abnormalities of gait and mobility (R26.89) Hemiplegia - Right/Left: Right Hemiplegia - dominant/non-dominant: Dominant Hemiplegia - caused by: Nontraumatic intracerebral hemorrhage     Time: 8527-7824 PT Time Calculation (min) (ACUTE ONLY): 24 min  Charges:  $Therapeutic Exercise: 8-22 mins                     Rolm Baptise, PT, DPT   Acute Rehabilitation Department Pager #: [phone]   Gaetana Michaelis 10/09/2020, 5:50 PM

## 2020-10-09 NOTE — Progress Notes (Signed)
Inpatient Rehab Admissions Coordinator:   Pt remains on the ventilator.  Will sign off for CIR at this time.  If pt is able to be extubated and participate in therapy, please re-consult for CIR.   Estill Dooms, PT, DPT Admissions Coordinator [phone] 10/09/20  9:22 AM

## 2020-10-09 NOTE — Progress Notes (Signed)
Pharmacy Antibiotic Note  Walter [name] is a 65 y.o. male admitted on 09/25/2020 with ICH now with Acenitobacter bacteremia .  Pharmacy managing Cefepime dosing. Fevers controlled with cooling blanket. WBC normalized. SCr wnl   Plan: -Continue Cefepime 2 gm IV Q 8 hours -Monitor fever and leukocytosis curve  -F/u acenitobacter sensitivities    Height: 6\' 2"  (188 cm) Weight: 106 kg (233 lb 11 oz) IBW/kg (Calculated) : 82.2  Temp (24hrs), Avg:98.5 F (36.9 C), Min:97 F (36.1 C), Max:99.9 F (37.7 C)  Recent Labs  Lab 10/04/20 0602 10/04/20 0748 10/05/20 0412 10/06/20 0538 10/07/20 0236 10/08/20 0132 10/09/20 0044  WBC 10.3  --  10.6* 12.4* 11.4*  --  7.6  CREATININE 0.95   < > 0.97 1.00 0.91 1.00 0.88   < > = values in this interval not displayed.    Estimated Creatinine Clearance: 108.5 mL/min (by C-G formula based on SCr of 0.88 mg/dL).    Allergies  Allergen Reactions  . Latex Itching    Skin turns real red    CTX 4/1 >> 4/3 Unasyn 4/4 >> 4/8 Vanc 4/8 >> 4/9 Cefepime 4/8 >>  3/28 MRSA PCR >> neg 3/30 RCx (TA) >> moraxella catarrhalis +beta lactamase+ 4/5 TA >> final 4/8 BCx - 1/2 Acinetobacter  4/8 UCx - negative 4/8 TA >> rare candida   Thank you for allowing pharmacy to be a part of this patient's care.  6/8, PharmD., BCPS, BCCCP Clinical Pharmacist Please refer to [facility] for unit-specific pharmacist

## 2020-10-09 NOTE — Progress Notes (Signed)
Nutrition Follow-up  DOCUMENTATION CODES:   Not applicable  INTERVENTION:   Continue Tube Feeding via Cortrak:  Vital 1.5 at 65 ml/h  Prosource TF 45 ml BID  Provides 2420 kcal, 127 gm protein, 1185 ml free water daily   NUTRITION DIAGNOSIS:   Inadequate oral intake related to inability to eat as evidenced by NPO status.  Being addressed via TF   GOAL:   Patient will meet greater than or equal to 90% of their needs  Progressing  MONITOR:   TF tolerance  REASON FOR ASSESSMENT:   Consult,Ventilator Enteral/tube feeding initiation and management  ASSESSMENT:   Pt with PMH of afib not on anticoagulation s/p ablation 9/21, s/p cardioversion 2015, retinal detachment s/p surgery, COVID s/p monoclonal antibody treatment 7/21, and CV admitted with right-sided weakness and ICH.  3/29 EVD placement  3/30 extubated; cortrak placed tip gastric  4/04 re-intubated 4/06 Trach placed  Pt remains on vent support via trach; pt has been febrile, on TTM 37 degrees. Remains encephalopathic off sedation, currently on sedation to treat shivering  Vital 1.5 at 65 ml/hr, Pro-Source TF 45 mL BID via Cortrak Free water 200 mL q 4 hours  Current weight 106 kg; admit weight 101 kg.   Labs: sodium 149 (H) Meds: ss novolog, lantus, nutrisource    Diet Order:   Diet Order            Diet NPO time specified  Diet effective now                 EDUCATION NEEDS:   No education needs have been identified at this time  Skin:  Skin Assessment: Reviewed RN Assessment  Last BM:  4/11 rectal tube  Height:   Ht Readings from Last 1 Encounters:  09/25/20 6\' 2"  (1.88 m)    Weight:   Wt Readings from Last 1 Encounters:  10/09/20 106 kg    Ideal Body Weight:  86.3 kg  BMI:  Body mass index is 30 kg/m.  Estimated Nutritional Needs:   Kcal:  2400-2600  Protein:  115-130 grams  Fluid:  >2 L/day    12/09/20 MS, RDN, LDN, CNSC Registered Dietitian III Clinical  Nutrition RD Pager and On-Call Pager Number Located in [place]

## 2020-10-10 DIAGNOSIS — I629 Nontraumatic intracranial hemorrhage, unspecified: Secondary | ICD-10-CM | POA: Diagnosis not present

## 2020-10-10 LAB — GLUCOSE, CAPILLARY
Glucose-Capillary: 134 mg/dL — ABNORMAL HIGH (ref 70–99)
Glucose-Capillary: 136 mg/dL — ABNORMAL HIGH (ref 70–99)
Glucose-Capillary: 150 mg/dL — ABNORMAL HIGH (ref 70–99)
Glucose-Capillary: 156 mg/dL — ABNORMAL HIGH (ref 70–99)
Glucose-Capillary: 157 mg/dL — ABNORMAL HIGH (ref 70–99)
Glucose-Capillary: 166 mg/dL — ABNORMAL HIGH (ref 70–99)
Glucose-Capillary: 166 mg/dL — ABNORMAL HIGH (ref 70–99)

## 2020-10-10 LAB — CULTURE, BLOOD (ROUTINE X 2)

## 2020-10-10 LAB — CULTURE, RESPIRATORY W GRAM STAIN

## 2020-10-10 MED ORDER — OXYCODONE HCL 20 MG/ML PO CONC: 5.0000 mg | Freq: Four times a day (QID) | ORAL | Status: DC

## 2020-10-10 MED ORDER — CLONAZEPAM 0.5 MG PO TBDP: 0.5000 mg | ORAL_TABLET | Freq: Two times a day (BID) | ORAL | Status: DC

## 2020-10-10 MED ORDER — OXYCODONE HCL 5 MG PO TABS: 5.0000 mg | ORAL_TABLET | Freq: Four times a day (QID) | ORAL | Status: DC | PRN

## 2020-10-10 MED ORDER — OXYCODONE HCL 20 MG/ML PO CONC: 10.0000 mg | Freq: Four times a day (QID) | ORAL | Status: DC

## 2020-10-10 MED ORDER — OXYCODONE HCL 20 MG/ML PO CONC: 5.0000 mg | Freq: Four times a day (QID) | ORAL | Status: DC | PRN

## 2020-10-10 MED ORDER — CLONIDINE HCL 0.2 MG PO TABS
0.2000 mg | ORAL_TABLET | Freq: Four times a day (QID) | ORAL | Status: AC
  Administered 2020-10-10 – 2020-10-12 (×11): 0.2 mg
  Filled 2020-10-10 (×11): qty 1

## 2020-10-10 MED ORDER — OXYCODONE HCL 5 MG PO TABS
10.0000 mg | ORAL_TABLET | Freq: Four times a day (QID) | ORAL | Status: DC
  Administered 2020-10-10 – 2020-10-12 (×8): 10 mg
  Filled 2020-10-10 (×8): qty 2

## 2020-10-10 MED ORDER — OXYCODONE HCL 5 MG PO TABS
5.0000 mg | ORAL_TABLET | Freq: Four times a day (QID) | ORAL | Status: DC
Start: 2020-10-13 — End: 2020-10-12

## 2020-10-10 MED ORDER — CLONAZEPAM 0.5 MG PO TBDP
1.0000 mg | ORAL_TABLET | Freq: Two times a day (BID) | ORAL | Status: DC
  Administered 2020-10-10 – 2020-10-11 (×4): 1 mg
  Filled 2020-10-10 (×5): qty 2

## 2020-10-10 NOTE — Progress Notes (Signed)
Pt. Placed back on vent for the night, VSS RT will continue to monitor.

## 2020-10-10 NOTE — Progress Notes (Signed)
  Speech Language Pathology  Patient Details Name: KEZIAH DROTAR MRN: [id number] DOB: 1956/03/17 Today's Date: 10/10/2020 Time:  -      Chart reviewed for appropriateness for inline PMV. His responsiveness is poor and he currently not able to provide feedback needed during assessment. Will follow.                       Royce Macadamia 10/10/2020, 8:45 AM Breck Coons Lonell Face.Ed Nurse, children's [phone] Office [phone]

## 2020-10-10 NOTE — Progress Notes (Signed)
Pt placed on 40% ATC and is tolerating well at this time. RN aware. RT to continue to monitor. 

## 2020-10-10 NOTE — Progress Notes (Signed)
STROKE TEAM PROGRESS NOTE  Significant Events: 3/29 s/p EVD 3/30 extubated  4/4 Required re-intubation for resp failure with hypoxia and unresponsiveness. Repeat CT showed clearance of IVH at third ventricle and upper lateral ventricles. Decompressed right lateral ventricle.  4/5 Seizure activity suspected, repeat HCT stable, Keppra started. IVC catheter drainage stopped and was flushed 4/6 Keppra changed to Vimpat d/t possible allergy (?lip angioedema). Afib with RVR overnight. Febrile to 102.4. Remains encephalopathic off sedation.  4/7 oozing from trach site. Remains encephalopathic off sedation.  4/9 continuous eeg discontinued, remains encephalopathic off sedation,  treating shivering with fentanyl and bromocriptine  4/10 Doppler shows bilateral DVT. IVC filter placed. sedating medications (fentanyl) on board for shivering.  4/11 Blood cultures positive for Acinetobacter. Abx regimen: Cefepime (since 4/8)  INTERVAL HISTORY Tmax 100.9, leukocytosis 11.4->7.6 yesterday, on cefepime since 4/8. Na drifting lower to 149.  He is now off Arctic sun and temperature management protocol.  His sedation was also reduced this morning.  His blood cultures grew acinetobacter.  His fever and WBC count have improved and he still has low-grade temperature and does get tremors to stimulation alone AKC  Vitals:   10/10/20 0500 10/10/20 0600 10/10/20 0700 10/10/20 0820  BP: (!) 118/55 (!) 113/58 118/60   Pulse: 74 64 (!) 59 69  Resp: 20 20 20  (!) 24  Temp:  (!) 100.4 F (38 C) 100.04 F (37.8 C) 100.22 F (37.9 C)  TempSrc:      SpO2: 99% 99% 100% 100%  Weight:      Height:       CBC:  Recent Labs  Lab 10/03/20 2035 10/04/20 0602 10/07/20 0236 10/09/20 0044  WBC 9.7   < > 11.4* 7.6  NEUTROABS 7.8*  --   --   --   HGB 11.5*   < > 11.1* 8.7*  HCT 36.1*   < > 34.9* 27.6*  MCV 101.1*   < > 101.5* 104.2*  PLT 125*   < > 118* 96*   < > = values in this interval not displayed.   Basic  Metabolic Panel:  Recent Labs  Lab 10/03/20 2035 10/04/20 0602 10/06/20 1845 10/07/20 0236 10/08/20 0132 10/09/20 0044  NA 154*   < >  --  151* 150* 149*  K 3.9   < >  --  4.1 4.9 3.9  CL 127*   < >  --  118* 117* 120*  CO2 28   < >  --  26 25 24   GLUCOSE 190*   < >  --  163* 132* 167*  BUN 27*   < >  --  28* 35* 36*  CREATININE 1.08   < >  --  0.91 1.00 0.88  CALCIUM 8.0*   < >  --  8.5* 8.5* 8.2*  MG 2.5*   < > 2.3 2.2  --   --   PHOS 2.9  --   --   --   --   --    < > = values in this interval not displayed.   Lipid Panel:  No results for input(s): CHOL, TRIG, HDL, CHOLHDL, VLDL, LDLCALC in the last 168 hours. HgbA1c:  No results for input(s): HGBA1C in the last 168 hours. Urine Drug Screen:  No results for input(s): LABOPIA, COCAINSCRNUR, LABBENZ, AMPHETMU, THCU, LABBARB in the last 168 hours.  Alcohol Level  No results for input(s): ETH in the last 168 hours.  IMAGING  MRI Brain 4/8 1. Similar appearance of  intraventricular hemorrhage as well as hemorrhage along prior EVD tract in the right frontal lobe and scattered cortical subarachnoid hemorrhage. 2. Ventricular size remains stable. 3. No significant change in size of intraparenchymal hemorrhage centered within the left side of the midbrain extending into the thalamic pulvinar and superior cerebellar peduncle. 4. A few small foci of restricted diffusion within the left frontal and parietal lobes, corresponding to small acute/subacute infarcts.  CT head 4/8 1. Unchanged appearance of intraparenchymal and subarachnoid hemorrhage, predominantly along the course of the EVD catheter that has now been removed. 2. Unchanged dorsal left midbrain intraparenchymal hemorrhage.  CT head 4/6 1. Ventricle size shows mild interval enlargement since yesterday. No significant hydrocephalus. Intraventricular hemorrhage stable 2. Right frontal ventricular drain unchanged. Hemorrhage surrounding the drain unchanged. 3.  Subarachnoid hemorrhage unchanged. Hemorrhage in the left tectum unchanged. No new hemorrhage identified.  CT head 4/4 1. Interval decrease in intraventricular hemorrhage with decreased size of the lateral ventricles as compared to prior, right greater than left. Right parietal approach ventriculostomy in stable position with tip near the septum pellucidum. 2. Stable small volume subarachnoid and intraparenchymal hemorrhage along the catheter tract. 3. Stable 1.9 cm hemorrhage at the left dorsal pons. 4. No other new acute intracranial abnormality. 09/29/2020  CT head 4/3 IMPRESSION: 1. New intraventricular hemorrhage in the third and lateral ventricles. Fourth ventricular and midbrain hemorrhage is unchanged. 2. New right frontal EVD which traverses the right lateral ventricle. Blood clot encompasses the lower catheter and ventriculomegaly is similar to prior. 3. Small cortical infarcts are newly seen along the superior left frontal convexity.  09/26/2020 IR Angio IMPRESSION: 1. No evidence of an AVM, dural arteriovenous fistula, aneurysm or other vascular abnormality to explain patient's brainstem hemorrhage.  09/26/2020 CT head:  IMPRESSION: 1. Unchanged left brainstem hemorrhage extending into the fourth ventricle. 2. Worsening hydrocephalus.  09/25/2020 MRI brain w/wo, MR Venogram IMPRESSION: 1. Acute hemorrhage centered within the left dorsal midbrain extending into the dorsal pons and inferomedial thalamus with intraventricular extension. No abnormal enhancement to suggest underlying lesion. 2. Compression of the cerebral aqueduct with possible mild obstructive hydrocephalus. 3. No evidence of dural sinus thrombosis. Attenuation of the left basal vein in the quadrigeminal cistern without definite occlusion.  09/25/2020 CT Angio head/neck IMPRESSION: 1. No vascular malformation, anomaly or aneurysm. 2. No emergent large vessel occlusion or high-grade stenosis of  the intracranial arteries. 3. Unchanged size of dorsal left pontine hemorrhage with intraventricular extension.  09/25/2020 CT head code stroke IMPRESSION: Acute intraparenchymal hemorrhage in the dorsal left pons with subarachnoid extension into the cerebral aqueduct and fourth Ventricle.  EEG-LTM 4/5-4/6  This study showed periodic discharges with triphasic morphology at 1.5 to 2.5 Hz which is on the ictal-interictal continuum with low to intermediate potential for seizures.  Additionally there is evidence of moderate to severe diffuse encephalopathy, nonspecific etiology.  No seizures were seen during the study  PHYSICAL EXAM Blood pressure 118/60, pulse 69, temperature 100.22 F (37.9 C), resp. rate (!) 24, height 6\' 2"  (1.88 m), weight 106 kg, SpO2 100 %.  General: Elderly caucasian male, critically ill, s/p tracheostomy Sedated.  Surgical staples right frontal. Lungs: Symmetrical Chest rise, no labored breathing Cardio: Irregular Abdomen: Soft, non-tender  Neurological Exam :  S/p tracheostomy. Keeps eyes closed. Eyes in mid position. PERRL. Positive doll's eyes, Minimal corneal responsiveness.  + blink to threat on left, absent on right. Face symmetric   Motor: Normal bulk and tone. He does not withdraw to noxious stimulation to  BUE or BLE.   ASSESSMENT/PLAN Mr. Lynann BeaverSteven L Willden is a 65 y.o. male with history of atrial flutter newly diagnosed with atrial fibrillation in February 2022 not on anticoagulation, follows with Dr. Johney FrameAllred in cardiology.  He presented with right-sided weakness, slurred speech, headache, and vomiting.Last known well when he went to bed at 10 PM on 09/24/2020 and woke up at around 2 AM on the day of admission with slurred speech.  EMS was called.He was very drowsy upon their assessment and notable right side weakeness. He had worsening mental status, GCS 5, so was emergently intubated in ED for airway protection. His MRI brain showed acute hemorrhage at  left midbrain extending into pons with IVH associated with obstructive hydrocephalus.    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  Code Stroke CT head-Acute IPH in the dorsal left pons with subarachnoid extension into the cerebral aqueduct and fourth ventricle.  CTA head & neck-No vascular malformation, anomaly or aneurysm. No emergent large vessel occlusion or high-grade stenosis. Unchanged size of dorsal left pontine hemorrhage with intraventricular extension.  MRI& MR Venogram-Acute hemorrhage centered within the left dorsal midbrain extending into the dorsal pons and inferomedial thalamus with intraventricular extension. Compression of the cerebral aqueduct with possible mild obstructive hydrocephalus. No evidence of dural sinus thrombosis.   CT Head W/O Contrast 3/29 0250 -Unchanged left brainstem hemorrhage extending into the fourth ventricle. Worsening hydrocephalus.  Diagnostic Cerebral Angiogram - No evidence of an aneurysm, AVM, dural AV fistula or other vascular abnormality to explain patient's known intraparenchymal hemorrhage.  2D Echo- EF:55-60%. No wall motion abnormalities.  LDL- 88  HgbA1c5.7  VTE prophylaxis -subcu heparin  No antithromboticprior to admission, now on Aspirin 81 mg. Follow up in clinic regarding further use of antiplatelet vs. anticoagulation.  Therapy recommendations:CIR  Disposition:Pending  New Frontal Parietal Stroke  New left parietal and frontal infarcts noted on MRI 10/06/2020. Likely from underlying atrial fibrillation not on anticoagulation. Continue aspirin and will have to hold anticoagulation for at least 2 to 4 weeks given his intracerebral hemorrhage.  Obstructive hydrocephalus   EVD placed on 3/30   CT 4/1 showed  clot in right lateral ventricle around catheter.   S/p 1mg  (1cc) alteplase placed in EVD 09/29/20 -> drain well ->ICP measuring 4-11 with a good waveform  CT head (4/3) resolution of most of the IVH and improved hydrocephalus.  EVD stopped working 4/2 overnight. 0.5mg  alteplase placed in EVD 10/01/2020 ->  Post alteplase CT head new hemorrhage along the EVD tract an in the right  lateral ventricle.  CT repeat 4/4 - decreased IVH and decompressed right lateral ventricle  CT repeat 4/5 -mild interval enlargement of ventricular size, no significant hydrocephalus.  IVH stable.  HCT 4/7 stable, with no appreciable change in overall ventricular size  HCT 4/8 repeat stable  Cerebral Edema  PICC line placement 4/3 for hypertonic saline solution  Off 3% saline now -> Free water added: 150ml q 4 hours  Allow Na to gradually down trend   Acute Respiratory Failure  Required intubation on 4/4 due to worsening MS and hypoxia  S/p trach 10/04/20, remains on mech vent  CCM managing  Aspiration Pneumonia Community-acquired pneumonia with Moraxella Fever   Tmax 101.1->102.4->103.6  Sputum culture (3/30): Moraxella  Sputum culture 4/5: neg  Ceftriaxone  dc'ed 4/1, 4/1-4/8 Unasyn 3g q6hr  WBC 10.7->16.3->11.0->9.7->10.3->10.6->12.4  CXR 4/7, 4/8 left basilar opacities  Fever work up today with blood cultures, UA, Resp culture, UA neg  CCM broadened abx coverage to Vanc and Cefepime, have deferred on doing LP as current treatment covers hypothetical CSF infection    CCM following  AKI, resolved  Monitor chemistries, Cr WNL currently   On FW and tube feeding  CCM on board  SIRPIDs Severe encephalopathy   Left UE intermittent jerking with stimulation  LTM EEG: GPEDs with triphasic wave, generalized SIRPIDs, although focal seizure is in DDx  S/p keppra 10/03/20 -> d/c concerning for angioedema   Now on vimpat 10/04/20  EEG also showed moderate to severe diffuse encephalopathy -  likely related to       fever, pneumonia   Remains minimally responsive off sedation  MRI brain 4/8 -stable IVH, midbrain ICH and right frontal EVD tract hemorrhage. Ventricular size stable.  2-3 punctate infarcts left frontal and parietal lobes,  corresponding to small acute/subacute infarcts.  Angioedema   Upper lip edema, resolved  Concerning for keppra allergy  DDx including the ET tube biting on the upper lip  Now s/p trach - continue to monitor  D/c keppra - switched to vimpat  Hypertension  Home meds:Diltiazem  Currently on Diltiazem 30 mg q6  BP goal Systolic < 160  Long-term BP goal normotensive  Aflutter and Afib  Not on [facility] before admission  S/p CTI ablation for A flutter  Currently on ASA  Not AC candidate at this time.  Afib with RVR overnight 4/6  Follow up in clinic regarding further use of antiplatelet vs. anticoagulation.  Hyperlipidemia  Home meds:none  LDL88, goal < 70  Addstatin once hemorrhage is stable  Continue statin at discharge  Dysphagia  Secondary to hemorrhagic stroke  NPO  SLP on board  Consider PEG   Tube feeds @ 71ml/hr and FW 150 Q4   Heme   New bilateral lower extremity DVT as per lower extremity ultrasound on 10/08/2020. Are continuing heparin for DVT prophylaxis, can not anticoagulate at this time due to bleed.   Other Stroke Risk Factors  Advanced Age >/= 87  Snoring: sleep study denied by insurance prior to admission   Other Active Problems:  Hypokalemia  Patient seen and discussed with attending physician Dr. Charlies Constable, [facility] Nurse Practitioner  I have personally obtained history,examined this patient, reviewed notes, independently viewed imaging studies, participated in medical decision making and plan of care.ROS completed by me personally and pertinent positives fully documented  I have made any additions or clarifications directly to the above note.  Agree with note above.      10/10/2020 9:39 AM Patient temperature has improved and white count is also trending down.  Is Precedex was discontinued this morning and plan is to also stop fentanyl later today.  Continue weaning trial blood cultures have grown acitenobacter and he is on cefepime day #3 I had a long discussion the patient wife and daughter at the bedside and with Dr. Denese Killings.  Continue medical management of his high temperature and  as per CCM team.  Continue bromocriptine dose to 5 mg twice daily for possibility of central fever.  Wean off ventilatory support to trach collar as tolerated.  Hopefully if fever improves and infection is treated he may become more responsive. . Patient has bilateral DVT but unfortunately very cannot use full anticoagulation given his recent intracerebral hemorrhage.  He had placement of IVC filter yesterday by interventional radiology.  Continue Vimpat and the current dose.  Patient is too high risk for full anticoagulation at the present time given his recent intracerebral hemorrhage and still hold off for at least 2 weeks. This patient is critically ill and at significant risk of neurological worsening, death and care requires constant monitoring of vital signs, hemodynamics,respiratory and cardiac monitoring, extensive review of multiple databases, frequent neurological assessment, discussion with family, other specialists and medical decision making of high complexity.I have made any additions or clarifications directly to the above note.This critical care time does not reflect procedure time, or teaching time or supervisory time of PA/NP/Med Resident etc but could involve care discussion time.  I spent 35 minutes of neurocritical care time  in the care of  this patient.      Delia Heady, MD Medical Director [facility] Pager: [phone] 10/10/2020 9:39 AM

## 2020-10-10 NOTE — Progress Notes (Signed)
NAME:  Walter [name], MRN:  [id number], DOB:  21-May-1956, LOS: 15 ADMISSION DATE:  09/25/2020, CONSULTATION DATE:  09/25/20 REFERRING MD:  Wilford Corner,  CHIEF COMPLAINT:  Headache, r side weakness, slurred speech  History of Present Illness:  Mr. Ehle is a 65 yo man with hx of afib not on anticoagulation admitted with right-sided weakness, hemorrhagic stroke. Emergently intubated in the ED for airway protection  Pertinent  Medical History  Aflutter/afib (not on ac) S/p Ablation 03/2020, hx of Cardioversion 2015 Hx retinal detachment Covid infection in July 2021 treated with monoclonal antibody   Significant Hospital Events: Including procedures, antibiotic start and stop dates in addition to other pertinent events   . 3/28- Admit, intubated.  Not a candidate for TPA due to intracranial hemorrhage.  Started 3% saline . 3/29- EVD placement for developing hydrocephalus. CTA of the head was unremarkable . 3/30- Extubated. . 4/1 respiratory culture grew Moraxella, on ceftriaxone . 4/4 reintubated because of hypoxia and unresponsiveness . 4/6 tracheostomy . 4/7 still encephalopathic  . 4/8 remains encephalopathic . 4/10 Doppler shows bilateral DVT. IVC filter placed.  . 4/11 Blood cultures positive for Acinetobacter   Interim History / Subjective:   Fever and WBC have improved. Temperature remains controlled off of [employer].  Continues to have generalized stimulus induced tremors.   Objective   Blood pressure 118/60, pulse (!) 59, temperature 100.04 F (37.8 C), resp. rate 20, height 6\' 2"  (1.88 m), weight 106 kg, SpO2 100 %.    Vent Mode: PRVC FiO2 (%):  [40 %] 40 % Set Rate:  [20 bmp] 20 bmp Vt Set:  [650 mL] 650 mL PEEP:  [5 cmH20] 5 cmH20 Plateau Pressure:  [16 cmH20-19 cmH20] 18 cmH20   Intake/Output Summary (Last 24 hours) at 10/10/2020 0804 Last data filed at 10/10/2020 12/10/2020 Gross per 24 hour  Intake 3478.51 ml  Output 1850 ml  Net 1628.51 ml   Filed Weights   10/07/20  0308 10/08/20 0400 10/09/20 0500  Weight: 104.7 kg 107.8 kg 106 kg    Examination: Gen:     Ill appearing older adult M trach/vent, NAD  HEENT: Trach secure, no oozing around stoma.  Lungs:    Chest clear bilaterally, tolerating pressure support.  CV:      HS normal Abd:     Soft round nd. + bowel sounds  X 4 quads, Loose stool per Flexiseal Ext:  moderate edema.  Skin:     Warm no rash, no lesions, clean, dry and intact, no evidence of cellulitis.  Line sites intact. Neuro: Sedated with no response to painful or verbal stimulation.  Shivering with stimulation.   Labs/imaging that I havepersonally reviewed  (right click and "Reselect all SmartList Selections" daily)   CT shows no worsening hydrocephalus, improving hemorrhage Cultures - Acinetobacter in blood.  Improving sodium Improving leukocytosis  Resolved Hospital Problem list   AKI   Assessment & Plan:   Acute midbrain/dorsal pons/ thalamic IPH  Acute obstructive hydrocephalus s/p EVD  Bilateral DVT's Acute encephalopathy Acute respiratory failure S/p tracheostomy  Pyrexia - multifactorial: central vs. Acinetobacter bacteremia Afib - currently sinus Hypernatremia  Thrombocytopenia, mild  Urinary retention   Plan:  - keep of 12/09/20. Continue acetaminophen, buspirone and bromocriptine.  - convert to enteral sedation to help control movements and potentially allow patient to wake up more.  - continue current antibiotics - will cover CSF infection if present. Adjust based on Acinetobacter susceptibilities.  - IVC filter until 4/24 -  then should be able to transition to anticoagulation and remove filter.  -Trach collar trial today.  Best Practice (right click and "Reselect all SmartList Selections" daily)   Diet:  Tube Feed  on fiber for diarrhea - enteral oxycodone may help as would a conversion to intermittent feeding Pain/Anxiety/Delirium protocol (if indicated): RASS goal -1. Convert to enteral agents.   VAP protocol (if indicated): Yes DVT prophylaxis: SCD GI prophylaxis: PPI Glucose control:  SSI Yes Central venous access:  N/A Arterial line:  N/A Foley:  Yes, and it is no longer needed-- will trial dc 4/7  Mobility:  bed rest  PT consulted: Yes Code Status:  full code Prognosis: Guarded Last date of multidisciplinary goals of care discussion 4/10 indicated that patient's fever persists likely central in origin.  Currently has antibiotic coverage for all possibilities.  Likely simply needs time.   CRITICAL CARE Performed by: Lynnell Catalan  Total critical care time: 40 minutes  Critical care time was exclusive of separately billable procedures and treating other patients. Critical care was necessary to treat or prevent imminent or life-threatening deterioration.  Critical care was time spent personally by me on the following activities: development of treatment plan with patient and/or surrogate as well as nursing, discussions with consultants, evaluation of patient's response to treatment, examination of patient, obtaining history from patient or surrogate, ordering and performing treatments and interventions, ordering and review of laboratory studies, ordering and review of radiographic studies, pulse oximetry and re-evaluation of patient's condition.  Lynnell Catalan, MD [facility] ICU Physician [facility] [place] [facility]  Pager: [phone] Or Epic Secure Chat After hours: [phone].  10/10/2020, 8:04 AM

## 2020-10-10 NOTE — Progress Notes (Signed)
Physical Therapy Treatment Patient Details Name: Walter [name] MRN: [id number] DOB: 1956/04/10 Today's Date: 10/10/2020    History of Present Illness 65 yo male presenting 3/28 with R-sided weakness and slurred speech. Imaging revealed acute hemorrhage centered within the left dorsal midbrain extending into the dorsal pons and inferomedial thalamus with intraventricular extension. EVD placement for developing hydrocephalus on 3/29. Extubated 3/30. Pt with noted decrease in EVD drainage overnight 3/31, proximal and distal clots noted, flushed, repeat CT shows significant clot in R lateral ventricle. EVD with another episode of clotting overnight 4/2, intraventricular TpA administered. Reintubated 4/4. 4/6 tracheostomy. 4/10 Doppler shows bilateral DVT. IVC filter placed. PMH Afib, COVID infection (July 2021), and retinal detachment.    PT Comments    Pt seen for PROM of all extremities, repositioning, and cervical stretching. Bed placed in upright position at 60 degrees to promote arousal. Pt reactive to pain in all 4 extremities; keeping left eye partially open. Unable to visually track, but blinking to threat. Continues with myoclonic jerking in reaction to generalized stimulus. 100% SpO2 on 40% FiO2, 5 PEEP CPAP. BP 148/58 in upright position, HR did not change, stayed stable in 70's. Will continue to follow acutely.    Follow Up Recommendations  SNF     Equipment Recommendations  Hospital bed;Other (comment) (hoyer lift)    Recommendations for Other Services       Precautions / Restrictions Precautions Precautions: Fall Required Braces or Orthoses: Other Brace Other Brace: BUE resting hand splints, see schedule Restrictions Weight Bearing Restrictions: No    Mobility  Bed Mobility               General bed mobility comments: Total A for repositioning in bed and optimizing upright posture with bed    Transfers                 General transfer comment: Defer due  to arousal level  Ambulation/Gait                 Stairs             Wheelchair Mobility    Modified Rankin (Stroke Patients Only) Modified Rankin (Stroke Patients Only) Pre-Morbid Rankin Score: No symptoms Modified Rankin: Severe disability     Balance Overall balance assessment: Needs assistance Sitting-balance support: No upper extremity supported;Feet supported Sitting balance-Leahy Scale: Poor Sitting balance - Comments: totalA for all repositioning                                    Cognition Arousal/Alertness: Lethargic Behavior During Therapy: Flat affect Overall Cognitive Status: Difficult to assess                                 General Comments: Poor arousal. Left eye partially open. No reaction to ROM; no change in HR or RR      Exercises General Exercises - Upper Extremity Digit Composite Flexion: PROM;Both;10 reps;Supine Composite Extension: PROM;Both;10 reps;Supine General Exercises - Lower Extremity Ankle Circles/Pumps: PROM;Both;Supine;10 reps Heel Slides: PROM;Both;10 reps;Supine Other Exercises Other Exercises: Lateral cervical right rotation and lateral flexion x 5 each Other Exercises: PROM BLE hip internal/external rotation x 10 each Other Exercises: PROM BUE D1/D2 x 10 each    General Comments  VSS, 40% FiO2, 5 PEEP on CPAP      Pertinent Vitals/Pain Pain Assessment:  Faces Faces Pain Scale: No hurt    Home Living                      Prior Function            PT Goals (current goals can now be found in the care plan section) Acute Rehab PT Goals Patient Stated Goal: unable, intubated PT Goal Formulation: With patient Time For Goal Achievement: 10/16/20 Potential to Achieve Goals: Fair Progress towards PT goals: Not progressing toward goals - comment    Frequency    Min 3X/week      PT Plan Discharge plan needs to be updated    Co-evaluation               AM-PAC PT "6 Clicks" Mobility   Outcome Measure  Help needed turning from your back to your side while in a flat bed without using bedrails?: Total Help needed moving from lying on your back to sitting on the side of a flat bed without using bedrails?: Total Help needed moving to and from a bed to a chair (including a wheelchair)?: Total Help needed standing up from a chair using your arms (e.g., wheelchair or bedside chair)?: Total Help needed to walk in hospital room?: Total Help needed climbing 3-5 steps with a railing? : Total 6 Click Score: 6    End of Session Equipment Utilized During Treatment: Oxygen (vent trach) Activity Tolerance: Patient limited by lethargy Patient left: in bed;with call bell/phone within reach;with SCD's reapplied Nurse Communication: Mobility status PT Visit Diagnosis: Hemiplegia and hemiparesis;Other abnormalities of gait and mobility (R26.89) Hemiplegia - Right/Left: Right Hemiplegia - dominant/non-dominant: Dominant Hemiplegia - caused by: Nontraumatic intracerebral hemorrhage     Time: 7124-5809 PT Time Calculation (min) (ACUTE ONLY): 23 min  Charges:  $Therapeutic Exercise: 23-37 mins                     Lillia Pauls, PT, DPT Acute Rehabilitation Services Pager [phone] Office [phone]    [name] 10/10/2020, 9:41 AM

## 2020-10-11 ENCOUNTER — Inpatient Hospital Stay (HOSPITAL_COMMUNITY): Payer: Medicare Other

## 2020-10-11 DIAGNOSIS — I629 Nontraumatic intracranial hemorrhage, unspecified: Secondary | ICD-10-CM | POA: Diagnosis not present

## 2020-10-11 LAB — GLUCOSE, CAPILLARY
Glucose-Capillary: 117 mg/dL — ABNORMAL HIGH (ref 70–99)
Glucose-Capillary: 131 mg/dL — ABNORMAL HIGH (ref 70–99)
Glucose-Capillary: 136 mg/dL — ABNORMAL HIGH (ref 70–99)
Glucose-Capillary: 146 mg/dL — ABNORMAL HIGH (ref 70–99)
Glucose-Capillary: 172 mg/dL — ABNORMAL HIGH (ref 70–99)
Glucose-Capillary: 192 mg/dL — ABNORMAL HIGH (ref 70–99)

## 2020-10-11 LAB — CULTURE, BLOOD (ROUTINE X 2): Culture: NO GROWTH

## 2020-10-11 MED ORDER — FUROSEMIDE 10 MG/ML IJ SOLN
40.0000 mg | Freq: Once | INTRAMUSCULAR | Status: AC
  Administered 2020-10-11: 40 mg via INTRAVENOUS
  Filled 2020-10-11: qty 4

## 2020-10-11 NOTE — Progress Notes (Signed)
Orthopedic Tech Progress Note Patient Details:  Walter [name] [birth year] [id number]  Ortho Devices Type of Ortho Device: Prafo boot/shoe Ortho Device/Splint Location: BLE Ortho Device/Splint Interventions: Ordered,Application,Adjustment   Post Interventions Patient Tolerated: Well Instructions Provided: Care of device   [name] 10/11/2020, 7:08 PM

## 2020-10-11 NOTE — Progress Notes (Signed)
STROKE TEAM PROGRESS NOTE  Significant Events: 3/29 s/p EVD 3/30 extubated  4/4 Required re-intubation for resp failure with hypoxia and unresponsiveness. Repeat CT showed clearance of IVH at third ventricle and upper lateral ventricles. Decompressed right lateral ventricle.  4/5 Seizure activity suspected, repeat HCT stable, Keppra started. IVC catheter drainage stopped and was flushed 4/6 Keppra changed to Vimpat d/t possible allergy (?lip angioedema). Afib with RVR overnight. Febrile to 102.4. Remains encephalopathic off sedation.  4/7 oozing from trach site. Remains encephalopathic off sedation.  4/9 continuous eeg discontinued, remains encephalopathic off sedation  4/9 remains encephalopathic however nursing noted overnight when fever broke he followed commands for a five minute period  4/9 remains encephalopathic, are treating shivering with fentanyl and bromocriptine   INTERVAL HISTORY Patient is now off IV sedation but is getting intermittent sedation through NG tube.  Remains poorly responsive.  He grimaces to pain and sternal rub and partially localizes left upper and lower extremity only.  Repeat CT scan of the head this morning shows expected evolutionary changes without any increase in hemorrhage or new stroke or cerebral edema.  He continues to have low-grade fever.  He did well on trach collar during the day yesterday but was rested at night on ventilator. Vitals:   10/11/20 0400 10/11/20 0415 10/11/20 0500 10/11/20 0600  BP: (!) 173/67  (!) 153/61 (!) 161/68  Pulse:   68 73  Resp:   20 20  Temp:   (!) 100.58 F (38.1 C) (!) 100.76 F (38.2 C)  TempSrc:      SpO2:  100% 100% 100%  Weight:      Height:       CBC:  Recent Labs  Lab 10/07/20 0236 10/09/20 0044  WBC 11.4* 7.6  HGB 11.1* 8.7*  HCT 34.9* 27.6*  MCV 101.5* 104.2*  PLT 118* 96*   Basic Metabolic Panel:  Recent Labs  Lab 10/06/20 1845 10/07/20 0236 10/08/20 0132 10/09/20 0044  NA  --  151* 150*  149*  K  --  4.1 4.9 3.9  CL  --  118* 117* 120*  CO2  --  26 25 24   GLUCOSE  --  163* 132* 167*  BUN  --  28* 35* 36*  CREATININE  --  0.91 1.00 0.88  CALCIUM  --  8.5* 8.5* 8.2*  MG 2.3 2.2  --   --    Lipid Panel:  No results for input(s): CHOL, TRIG, HDL, CHOLHDL, VLDL, LDLCALC in the last 168 hours. HgbA1c:  No results for input(s): HGBA1C in the last 168 hours. Urine Drug Screen:  No results for input(s): LABOPIA, COCAINSCRNUR, LABBENZ, AMPHETMU, THCU, LABBARB in the last 168 hours.  Alcohol Level  No results for input(s): ETH in the last 168 hours.  IMAGING  MRI Brain 4/8 1. Similar appearance of intraventricular hemorrhage as well as hemorrhage along prior EVD tract in the right frontal lobe and scattered cortical subarachnoid hemorrhage. 2. Ventricular size remains stable. 3. No significant change in size of intraparenchymal hemorrhage centered within the left side of the midbrain extending into the thalamic pulvinar and superior cerebellar peduncle. 4. A few small foci of restricted diffusion within the left frontal and parietal lobes, corresponding to small acute/subacute infarcts.  CT head 4/8 1. Unchanged appearance of intraparenchymal and subarachnoid hemorrhage, predominantly along the course of the EVD catheter that has now been removed. 2. Unchanged dorsal left midbrain intraparenchymal hemorrhage.  CT head 4/6 1. Ventricle size shows mild interval enlargement since  yesterday. No significant hydrocephalus. Intraventricular hemorrhage stable 2. Right frontal ventricular drain unchanged. Hemorrhage surrounding the drain unchanged. 3. Subarachnoid hemorrhage unchanged. Hemorrhage in the left tectum unchanged. No new hemorrhage identified.  CT head 4/4 1. Interval decrease in intraventricular hemorrhage with decreased size of the lateral ventricles as compared to prior, right greater than left. Right parietal approach ventriculostomy in stable position with  tip near the septum pellucidum. 2. Stable small volume subarachnoid and intraparenchymal hemorrhage along the catheter tract. 3. Stable 1.9 cm hemorrhage at the left dorsal pons. 4. No other new acute intracranial abnormality. 09/29/2020  CT head 4/3 IMPRESSION: 1. New intraventricular hemorrhage in the third and lateral ventricles. Fourth ventricular and midbrain hemorrhage is unchanged. 2. New right frontal EVD which traverses the right lateral ventricle. Blood clot encompasses the lower catheter and ventriculomegaly is similar to prior. 3. Small cortical infarcts are newly seen along the superior left frontal convexity.  09/26/2020 IR Angio IMPRESSION: 1. No evidence of an AVM, dural arteriovenous fistula, aneurysm or other vascular abnormality to explain patient's brainstem hemorrhage.  09/26/2020 CT head:  IMPRESSION: 1. Unchanged left brainstem hemorrhage extending into the fourth ventricle. 2. Worsening hydrocephalus.  09/25/2020 MRI brain w/wo, MR Venogram IMPRESSION: 1. Acute hemorrhage centered within the left dorsal midbrain extending into the dorsal pons and inferomedial thalamus with intraventricular extension. No abnormal enhancement to suggest underlying lesion. 2. Compression of the cerebral aqueduct with possible mild obstructive hydrocephalus. 3. No evidence of dural sinus thrombosis. Attenuation of the left basal vein in the quadrigeminal cistern without definite occlusion.  09/25/2020 CT Angio head/neck IMPRESSION: 1. No vascular malformation, anomaly or aneurysm. 2. No emergent large vessel occlusion or high-grade stenosis of the intracranial arteries. 3. Unchanged size of dorsal left pontine hemorrhage with intraventricular extension.  09/25/2020 CT head code stroke IMPRESSION: Acute intraparenchymal hemorrhage in the dorsal left pons with subarachnoid extension into the cerebral aqueduct and fourth Ventricle.  EEG-LTM 4/5-4/6  This study  showed periodic discharges with triphasic morphology at 1.5 to 2.5 Hz which is on the ictal-interictal continuum with low to intermediate potential for seizures.  Additionally there is evidence of moderate to severe diffuse encephalopathy, nonspecific etiology.  No seizures were seen during the study  PHYSICAL EXAM Blood pressure (!) 161/68, pulse 73, temperature (!) 100.76 F (38.2 C), resp. rate 20, height 6\' 2"  (1.88 m), weight 106 kg, SpO2 100 %.  General: Elderly caucasian male, critically ill, s/p tracheostomy Sedated.  Surgical staples right frontal.. Angio edema of upper lip improving.  Lungs: Symmetrical Chest rise, no labored breathing Cardio: Irregular Abdomen: Soft, non-tender  Neurological Exam :  S/p tracheostomy. Keeps eyes closed. Eyes in mid position. PERRL. Positive doll's eyes, Minimal corneal responsiveness.  + blink to threat on left, absent on right. Facial symmetric, + cough during head turning   Motor: Normal bulk and tone. He does not withdraw to noxious stimulation to BUE or BLE.   ASSESSMENT/PLAN [name] is a 65 y.o. male with history of atrial flutter newly diagnosed with atrial fibrillation in February 2022 not on anticoagulation, follows with Dr. March 2022 in cardiology.  He presented with right-sided weakness, slurred speech, headache, and vomiting.Last known well when he went to bed at 10 PM on 09/24/2020 and woke up at around 2 AM on the day of admission with slurred speech.  EMS was called.He was very drowsy upon their assessment and notable right side weakeness. He had worsening mental status, GCS 5, so was emergently intubated in  ED for airway protection. His MRI brain showed acute hemorrhage at left midbrain extending into pons with IVH associated with obstructive hydrocephalus.    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 Encephalopathy likely multifactorial due to sepsis, fever, sedating medications, possible continuation from Vimpat and cefepime  Code Stroke CT head-Acute IPH in the dorsal left pons with subarachnoid extension into the cerebral aqueduct and fourth ventricle.  CTA head & neck-No vascular malformation, anomaly or aneurysm. No emergent large vessel occlusion or high-grade stenosis. Unchanged size of dorsal left pontine hemorrhage with intraventricular extension.  MRI& MR Venogram-Acute hemorrhage centered within the left dorsal midbrain extending into the dorsal pons and inferomedial thalamus with intraventricular extension. Compression of the cerebral aqueduct with possible mild obstructive hydrocephalus. No evidence of dural sinus thrombosis.   CT Head W/O Contrast 3/29 0250 -Unchanged left brainstem hemorrhage extending into the fourth ventricle. Worsening hydrocephalus.  Diagnostic Cerebral Angiogram - No evidence of an aneurysm, AVM, dural AV fistula or other vascular abnormality to explain patient's known intraparenchymal hemorrhage.  2D Echo- EF:55-60%. No wall motion abnormalities.  LDL- 88  HgbA1c5.7  VTE prophylaxis -subcu heparin  No antithromboticprior to admission, now on Aspirin 81 mg. Follow up in  clinic regarding further use of antiplatelet vs. anticoagulation.  Therapy recommendations:CIR  Disposition:Pending  New Frontal Parietal Stroke  New left parietal and frontal infarcts noted on MRI 10/06/2020. Likely from  underlying atrial fibrillation not on anticoagulation. Continue aspirin and will have  to hold anticoagulation for at least 2 to 4 weeks given his intracerebral  hemorrhage.  Obstructive hydrocephalus   EVD placed on 3/30   CT 4/1 showed clot in right lateral ventricle around catheter.   S/p 1mg  (1cc) alteplase placed in EVD 09/29/20 -> drain well ->ICP measuring  4-11 with a good waveform  CT head (4/3) resolution of most of the IVH and improved hydrocephalus.  EVD stopped working 4/2 overnight. 0.5mg  alteplase placed in EVD 10/01/2020 ->  Post alteplase CT head new hemorrhage along the EVD tract an in the right  lateral ventricle.  CT repeat 4/4 - decreased IVH and decompressed right lateral ventricle  CT repeat 4/5 -mild interval enlargement of ventricular size, no significant  hydrocephalus.  IVH stable.  HCT 4/7 stable, with no appreciable change in overall ventricular size  HCT 4/8 repeat stable  Cerebral Edema  PICC line placement 4/3 for hypertonic saline solution  Off 3% saline now -> Free water added: q 4 hours  Allow Na to gradually down trend   Acute Respiratory Failure  Required intubation on 4/4 due to worsening MS and hypoxia  S/p trach 10/04/20, remains on mech vent  CCM managing  Aspiration Pneumonia Community-acquired pneumonia with Moraxella Fever   Tmax 101.1->102.4->103.6  Sputum culture (3/30): Moraxella  Sputum culture 4/5: neg  Ceftriaxone  dc'ed 4/1, 4/1-4/8 Unasyn 3g q6hr  WBC 10.7->16.3->11.0->9.7->10.3->10.6->12.4  CXR 4/7, 4/8 left basilar opacities  Fever work up today with blood cultures, UA, Resp culture, UA neg  CCM broadened abx coverage to Vanc and Cefepime, have deferred on  doing LP as current treatment covers hypothetical CSF infection    CCM following  AKI, resolved  Monitor chemistries, Cr WNL currently   On FW and tube feeding  CCM on board  SIRPIDs Severe encephalopathy   Left UE intermittent jerking with stimulation  LTM EEG: GPEDs  with triphasic wave, generalized SIRPIDs, although focal seizure is in DDx  S/p keppra 10/03/20 -> d/c concerning for angioedema   Now on vimpat 10/04/20  EEG also showed moderate to severe diffuse encephalopathy - likely related to       fever, pneumonia   Remains minimally responsive off sedation  MRI brain 10/06/20 -stable IVH,  midbrain ICH and right frontal EVD tract hemorrhage.   Ventricular size stable.  2-3 punctate infarcts left frontal and parietal lobes,  corresponding to small acute/subacute infarcts.  CT head 10/11/2020 shows decreased ventricle size since 10/06/2020 with stable volume of small intraventricular hemorrhage.  Stable appearance of hematoma along the EVD track with small volume of bilateral subarachnoid hemorrhage.  Expected evolution of left midbrain/thalamic hemorrhage and edema.  Angioedema   Upper lip edema, improving  Concerning for keppra allergy  DDx including the ET tube biting on the upper lip  Now s/p trach - continue to monitor  D/c keppra - switched to vimpat  Hypertension  Home meds:Diltiazem  Currently on Diltiazem 30 mg q6  BP goal Systolic < 160  Long-term BP goal normotensive  Aflutter and Afib  Not on [facility] before admission  S/p CTI ablation for A flutter  Currently on ASA  Not AC candidate at this time.  Afib with RVR overnight 4/6  Follow up in clinic regarding further use of antiplatelet vs. anticoagulation.  Hyperlipidemia  Home meds:none  LDL88, goal < 70  Addstatin once hemorrhage is stable  Continue statin at discharge  Dysphagia  Secondary to hemorrhagic stroke  NPO  SLP on board  Now with neurologic decline will likely require PEG  Tube feeds @ 64ml/hr and FW 150 Q4  [id number]  Heme   New bilateral lower extremity DVT as per lower extremity ultrasound on  10/08/2020. Are continuing heparin for DVT prophylaxis, can not anticoagulate at  this time due to bleed.   Other Stroke Risk Factors  Advanced Age >/= 86  Snoring: sleep study denied by insurance prior to admission   Other Active Problems:  Hypokalemia   I have personally obtained history,examined this patient, reviewed notes, independently viewed imaging studies, participated in medical decision making and plan of care.ROS completed by me personally and  pertinent positives fully documented  I have made any additions or clarifications directly to the above note. . Recommend discontinuing Vimpat as patient has clearly not had a witnessed seizure that may be contributing to encephalopathy.  Consider discontinuing cefepime when patient is finished course of antibiotics for his sepsis could also contribute to encephalopathy.  Minimize sedation as tolerated.  Prognosis remains guarded.  Long discussion with bedside with the patient's wife and friend as well as with Dr. Lynnell Catalan critical care medicine and answered questions. This patient is critically ill and at significant risk of neurological worsening, death and care requires constant monitoring of vital signs, hemodynamics,respiratory and cardiac monitoring, extensive review of multiple databases, frequent neurological assessment, discussion with family, other specialists and medical decision making of high complexity.I have made any additions or clarifications directly to the above note.This critical care time does not reflect procedure time, or teaching time or supervisory time of PA/NP/Med Resident etc but could involve care discussion time.  I spent 32 minutes of neurocritical care time  in the care of  this patient.      Delia Heady, MD Medical Director [facility] Pager: [phone] 10/11/2020 10:34 AM

## 2020-10-11 NOTE — Progress Notes (Signed)
NAME:  Walter [name], MRN:  [id number], DOB:  Dec 07, 1955, LOS: 16 ADMISSION DATE:  09/25/2020, CONSULTATION DATE:  09/25/20 REFERRING MD:  Wilford Corner,  CHIEF COMPLAINT:  Headache, r side weakness, slurred speech  History of Present Illness:  Walter [name] is a 65 yo man with hx of afib not on anticoagulation admitted with right-sided weakness, hemorrhagic stroke. Emergently intubated in the ED for airway protection  Pertinent  Medical History  Aflutter/afib (not on ac) S/p Ablation 03/2020, hx of Cardioversion 2015 Hx retinal detachment Covid infection in July 2021 treated with monoclonal antibody  Significant Hospital Events: Including procedures, antibiotic start and stop dates in addition to other pertinent events   . 3/28- Admit, intubated.  Not a candidate for TPA due to intracranial hemorrhage.  Started 3% saline . 3/29- EVD placement for developing hydrocephalus. CTA of the head was unremarkable . 3/30- Extubated. . 4/1 respiratory culture grew Moraxella, on ceftriaxone . 4/4 reintubated because of hypoxia and unresponsiveness . 4/6 tracheostomy . 4/7 still encephalopathic  . 4/8 remains encephalopathic . 4/10 Doppler shows bilateral DVT. IVC filter placed.  . 4/11 Blood cultures positive for Acinetobacter  . 4/12 Fever no worse off [place] Sun, off sedative infusions, tolerating trach collar.   Interim History / Subjective:   Still having fevers but controlled without cooling. No further tremors with stimulation. Tolerating trach collar.   Objective   Blood pressure (!) 145/64, pulse 66, temperature 100.22 F (37.9 C), resp. rate (!) 22, height 6\' 2"  (1.88 m), weight 106 kg, SpO2 98 %.    Vent Mode: PRVC FiO2 (%):  [30 %-40 %] 30 % Set Rate:  [20 bmp] 20 bmp Vt Set:  [650 mL] 650 mL PEEP:  [5 cmH20] 5 cmH20 Plateau Pressure:  [15 cmH20-19 cmH20] 15 cmH20   Intake/Output Summary (Last 24 hours) at 10/11/2020 1036 Last data filed at 10/11/2020 0634 Gross per 24 hour  Intake  2648.11 ml  Output 1825 ml  Net 823.11 ml   Filed Weights   10/07/20 0308 10/08/20 0400 10/09/20 0500  Weight: 104.7 kg 107.8 kg 106 kg    Examination: Gen:     Ill appearing older adult M trach/vent, NAD  HEENT: Trach secure, no oozing around stoma.  Lungs:    Chest clear bilaterally, tolerating trach collar.  CV:      HS normal Abd:     Soft ,  Loose stool per Flexiseal Ext:  moderate edema.  Skin:     Warm no rash, no lesions, clean, dry and intact, no evidence of cellulitis.  Line sites intact. Neuro: Withdraws to pain on the right   Labs/imaging that I havepersonally reviewed  (right click and "Reselect all SmartList Selections" daily)   CT shows no worsening hydrocephalus, improving hemorrhage Cultures - Acinetobacter in blood.  Improving sodium Improving leukocytosis  Resolved Hospital Problem list   AKI   Assessment & Plan:   Acute midbrain/dorsal pons/ thalamic IPH  Acute obstructive hydrocephalus s/p EVD  Bilateral DVT's Acute encephalopathy Acute respiratory failure S/p tracheostomy  Pyrexia - multifactorial: central vs. Acinetobacter bacteremia Afib - currently sinus Hypernatremia  Thrombocytopenia, mild  Urinary retention   Plan:  -  Continue acetaminophen, buspirone and bromocriptine.  -Continue enteral sedation taper as helping  control movements - continue current antibiotics - will cover CSF infection if present. Adjust based on Acinetobacter susceptibilities.  - IVC filter until 4/24 - then should be able to transition to anticoagulation and remove filter.  - Open ended  trach collar trial  Best Practice (right click and "Reselect all SmartList Selections" daily)   Diet:  Tube Feed  on fiber for diarrhea - enteral oxycodone may help as would a conversion to intermittent feeding Pain/Anxiety/Delirium protocol (if indicated): RASS goal -1. Convert to enteral agents.  VAP protocol (if indicated): Yes DVT prophylaxis: SCD heparin - IVC filter in  place. Consider starting full dose AC on 4/24 and retrieving filter.  GI prophylaxis: PPI Glucose control:  SSI Yes Central venous access:  N/A Arterial line:  N/A Foley:  Yes, and it is no longer needed-- will trial dc 4/12 Mobility:  bed rest  PT consulted: Yes Code Status:  full code Prognosis: Guarded Last date of multidisciplinary goals of care discussion 4/12. Spoke to wife with Dr Pearlean Brownie. Both of [place] explained that while source of fever remains unclear, we are currently covering all bases and that the patient likely simply needs time.    CRITICAL CARE Performed by: Walter [name]  Total critical care time: 40 minutes  Critical care time was exclusive of separately billable procedures and treating other patients. Critical care was necessary to treat or prevent imminent or life-threatening deterioration.  Critical care was time spent personally by me on the following activities: development of treatment plan with patient and/or surrogate as well as nursing, discussions with consultants, evaluation of patient's response to treatment, examination of patient, obtaining history from patient or surrogate, ordering and performing treatments and interventions, ordering and review of laboratory studies, ordering and review of radiographic studies, pulse oximetry and re-evaluation of patient's condition.  Walter Catalan, MD [facility] ICU Physician [facility] Critical Care  Pager: [phone] Or Epic Secure Chat After hours: [phone].  10/11/2020, 10:36 AM

## 2020-10-11 NOTE — Progress Notes (Signed)
Patient transported from 4N29 to CT and back with no complications. 

## 2020-10-12 DIAGNOSIS — I613 Nontraumatic intracerebral hemorrhage in brain stem: Secondary | ICD-10-CM | POA: Diagnosis not present

## 2020-10-12 DIAGNOSIS — I824Y3 Acute embolism and thrombosis of unspecified deep veins of proximal lower extremity, bilateral: Secondary | ICD-10-CM | POA: Diagnosis not present

## 2020-10-12 DIAGNOSIS — J9601 Acute respiratory failure with hypoxia: Secondary | ICD-10-CM

## 2020-10-12 DIAGNOSIS — E870 Hyperosmolality and hypernatremia: Secondary | ICD-10-CM | POA: Diagnosis not present

## 2020-10-12 DIAGNOSIS — I629 Nontraumatic intracranial hemorrhage, unspecified: Secondary | ICD-10-CM | POA: Diagnosis not present

## 2020-10-12 LAB — GLUCOSE, CAPILLARY
Glucose-Capillary: 159 mg/dL — ABNORMAL HIGH (ref 70–99)
Glucose-Capillary: 165 mg/dL — ABNORMAL HIGH (ref 70–99)
Glucose-Capillary: 143 mg/dL — ABNORMAL HIGH (ref 70–99)
Glucose-Capillary: 126 mg/dL — ABNORMAL HIGH (ref 70–99)
Glucose-Capillary: 155 mg/dL — ABNORMAL HIGH (ref 70–99)
Glucose-Capillary: 152 mg/dL — ABNORMAL HIGH (ref 70–99)

## 2020-10-12 LAB — POCT I-STAT 7, (LYTES, BLD GAS, ICA,H+H)
Acid-Base Excess: 2 mmol/L (ref 0.0–2.0)
Bicarbonate: 26.7 mmol/L (ref 20.0–28.0)
Calcium, Ion: 1.18 mmol/L (ref 1.15–1.40)
HCT: 27 % — ABNORMAL LOW (ref 39.0–52.0)
Hemoglobin: 9.2 g/dL — ABNORMAL LOW (ref 13.0–17.0)
O2 Saturation: 98 %
Potassium: 4.4 mmol/L (ref 3.5–5.1)
Sodium: 140 mmol/L (ref 135–145)
TCO2: 28 mmol/L (ref 22–32)
pCO2 arterial: 38.9 mmHg (ref 32.0–48.0)
pH, Arterial: 7.443 (ref 7.350–7.450)
pO2, Arterial: 92 mmHg (ref 83.0–108.0)

## 2020-10-12 LAB — HEPATIC FUNCTION PANEL
ALT: 62 U/L — ABNORMAL HIGH (ref 0–44)
AST: 36 U/L (ref 15–41)
Albumin: 2.1 g/dL — ABNORMAL LOW (ref 3.5–5.0)
Alkaline Phosphatase: 60 U/L (ref 38–126)
Bilirubin, Direct: <0.1 mg/dL (ref 0.0–0.2)
Total Bilirubin: 0.7 mg/dL (ref 0.3–1.2)
Total Protein: 4.9 g/dL — ABNORMAL LOW (ref 6.5–8.1)

## 2020-10-12 LAB — CBC
HCT: 27.7 % — ABNORMAL LOW (ref 39.0–52.0)
Hemoglobin: 8.7 g/dL — ABNORMAL LOW (ref 13.0–17.0)
MCH: 31.9 pg (ref 26.0–34.0)
MCHC: 31.4 g/dL (ref 30.0–36.0)
MCV: 101.5 fL — ABNORMAL HIGH (ref 80.0–100.0)
Platelets: 182 10*3/uL (ref 150–400)
RBC: 2.73 MIL/uL — ABNORMAL LOW (ref 4.22–5.81)
RDW: 14.6 % (ref 11.5–15.5)
WBC: 9.8 10*3/uL (ref 4.0–10.5)
nRBC: 0.2 % (ref 0.0–0.2)

## 2020-10-12 LAB — BASIC METABOLIC PANEL
Anion gap: 8 (ref 5–15)
BUN: 25 mg/dL — ABNORMAL HIGH (ref 8–23)
CO2: 24 mmol/L (ref 22–32)
Calcium: 7.9 mg/dL — ABNORMAL LOW (ref 8.9–10.3)
Chloride: 109 mmol/L (ref 98–111)
Creatinine, Ser: 0.79 mg/dL (ref 0.61–1.24)
GFR, Estimated: >60 mL/min (ref >60)
Glucose, Bld: 148 mg/dL — ABNORMAL HIGH (ref 70–99)
Potassium: 4 mmol/L (ref 3.5–5.1)
Sodium: 141 mmol/L (ref 135–145)

## 2020-10-12 MED ORDER — IPRATROPIUM-ALBUTEROL 0.5-2.5 (3) MG/3ML IN SOLN
RESPIRATORY_TRACT | Status: AC
  Filled 2020-10-12: qty 3

## 2020-10-12 MED ORDER — ONDANSETRON HCL 4 MG/2ML IJ SOLN
INTRAMUSCULAR | Status: AC
  Administered 2020-10-12: 4 mg
  Filled 2020-10-12: qty 2

## 2020-10-12 MED ORDER — CLONIDINE HCL 0.1 MG PO TABS
0.1000 mg | ORAL_TABLET | Freq: Two times a day (BID) | ORAL | Status: AC
  Administered 2020-10-14 (×2): 0.1 mg
  Filled 2020-10-12 (×2): qty 1

## 2020-10-12 MED ORDER — CLONIDINE HCL 0.1 MG PO TABS
0.1000 mg | ORAL_TABLET | Freq: Four times a day (QID) | ORAL | Status: AC
  Administered 2020-10-13 – 2020-10-14 (×4): 0.1 mg
  Filled 2020-10-12 (×4): qty 1

## 2020-10-12 MED ORDER — ONDANSETRON HCL 4 MG/2ML IJ SOLN
4.0000 mg | Freq: Four times a day (QID) | INTRAMUSCULAR | Status: DC
  Administered 2020-10-12 – 2020-10-28 (×64): 4 mg via INTRAVENOUS
  Filled 2020-10-12 (×64): qty 2

## 2020-10-12 MED ORDER — CLONIDINE HCL 0.1 MG PO TABS
0.1000 mg | ORAL_TABLET | Freq: Every day | ORAL | Status: AC
  Administered 2020-10-15: 0.1 mg
  Filled 2020-10-12: qty 1

## 2020-10-12 NOTE — Progress Notes (Addendum)
STROKE TEAM PROGRESS NOTE   INTERVAL HISTORY No family at the bedside.  Patient lying in bed, still not responsive except open eyes on pain stimulation but not following commands or tracking.  Patient afebrile today, vital signs stable.  Still on cefepime.  CT head yesterday showed decreased ventricular size and stable ICH, IVH.  On tube feeding, off Vimpat.  We will repeat spot EEG.  Vitals:   10/12/20 0804 10/12/20 0900 10/12/20 1000 10/12/20 1156  BP:  140/76 126/64   Pulse:  62 65   Resp: 20 (!) 22 20 (!) 30  Temp:  98.96 F (37.2 C) 99.32 F (37.4 C)   TempSrc:      SpO2:  99% 96%   Weight:      Height:       CBC:  Recent Labs  Lab 10/09/20 0044 10/12/20 0242  WBC 7.6 9.8  HGB 8.7* 8.7*  HCT 27.6* 27.7*  MCV 104.2* 101.5*  PLT 96* 182   Basic Metabolic Panel:  Recent Labs  Lab 10/06/20 1845 10/07/20 0236 10/08/20 0132 10/09/20 0044 10/12/20 0242  NA  --  151*   < > 149* 141  K  --  4.1   < > 3.9 4.0  CL  --  118*   < > 120* 109  CO2  --  26   < > 24 24  GLUCOSE  --  163*   < > 167* 148*  BUN  --  28*   < > 36* 25*  CREATININE  --  0.91   < > 0.88 0.79  CALCIUM  --  8.5*   < > 8.2* 7.9*  MG 2.3 2.2  --   --   --    < > = values in this interval not displayed.   Lipid Panel:  No results for input(s): CHOL, TRIG, HDL, CHOLHDL, VLDL, LDLCALC in the last 168 hours. HgbA1c:  No results for input(s): HGBA1C in the last 168 hours. Urine Drug Screen:  No results for input(s): LABOPIA, COCAINSCRNUR, LABBENZ, AMPHETMU, THCU, LABBARB in the last 168 hours.  Alcohol Level  No results for input(s): ETH in the last 168 hours.  IMAGING  CT head 4/6 1. Ventricle size shows mild interval enlargement since yesterday. No significant hydrocephalus. Intraventricular hemorrhage stable 2. Right frontal ventricular drain unchanged. Hemorrhage surrounding the drain unchanged. 3. Subarachnoid hemorrhage unchanged. Hemorrhage in the left tectum unchanged. No new hemorrhage  identified.  CT head 4/4 1. Interval decrease in intraventricular hemorrhage with decreased size of the lateral ventricles as compared to prior, right greater than left. Right parietal approach ventriculostomy in stable position with tip near the septum pellucidum. 2. Stable small volume subarachnoid and intraparenchymal hemorrhage along the catheter tract. 3. Stable 1.9 cm hemorrhage at the left dorsal pons. 4. No other new acute intracranial abnormality. 09/29/2020  CT head 4/3 IMPRESSION: 1. New intraventricular hemorrhage in the third and lateral ventricles. Fourth ventricular and midbrain hemorrhage is unchanged. 2. New right frontal EVD which traverses the right lateral ventricle. Blood clot encompasses the lower catheter and ventriculomegaly is similar to prior. 3. Small cortical infarcts are newly seen along the superior left frontal convexity.  09/26/2020 IR Angio IMPRESSION: 1. No evidence of an AVM, dural arteriovenous fistula, aneurysm or other vascular abnormality to explain patient's brainstem hemorrhage.  09/26/2020 CT head:  IMPRESSION: 1. Unchanged left brainstem hemorrhage extending into the fourth ventricle. 2. Worsening hydrocephalus.  09/25/2020 MRI brain w/wo, MR Venogram IMPRESSION: 1. Acute hemorrhage  centered within the left dorsal midbrain extending into the dorsal pons and inferomedial thalamus with intraventricular extension. No abnormal enhancement to suggest underlying lesion. 2. Compression of the cerebral aqueduct with possible mild obstructive hydrocephalus. 3. No evidence of dural sinus thrombosis. Attenuation of the left basal vein in the quadrigeminal cistern without definite occlusion.  09/25/2020 CT Angio head/neck IMPRESSION: 1. No vascular malformation, anomaly or aneurysm. 2. No emergent large vessel occlusion or high-grade stenosis of the intracranial arteries. 3. Unchanged size of dorsal left pontine hemorrhage  with intraventricular extension.  09/25/2020 CT head code stroke IMPRESSION: Acute intraparenchymal hemorrhage in the dorsal left pons with subarachnoid extension into the cerebral aqueduct and fourth Ventricle.  EEG-LTM 4/5-4/6  This study showed periodic discharges with triphasic morphology at 1.5 to 2.5 Hz which is on the ictal-interictal continuum with low to intermediate potential for seizures.  Additionally there is evidence of moderate to severe diffuse encephalopathy, nonspecific etiology.  No seizures were seen during the study  PHYSICAL EXAM  Temp:  [98.78 F (37.1 C)-100.22 F (37.9 C)] 99.32 F (37.4 C) (04/14 1000) Pulse Rate:  [57-74] 65 (04/14 1000) Resp:  [20-36] 30 (04/14 1156) BP: (109-146)/(53-76) 126/64 (04/14 1000) SpO2:  [95 %-100 %] 96 % (04/14 1000) FiO2 (%):  [21 %-40 %] 21 % (04/14 1156) Weight:  [105.8 kg] 105.8 kg (04/14 0500)  General - Well nourished, well developed, on trach collar.  Ophthalmologic - fundi not visualized due to noncooperation.  Cardiovascular - Regular rate and rhythm, not in afib.  Neuro - on trach collar, eyes closed but later open on pain stimulation, but not following commands. With forced eye opening, eyes in mid position, not blinking to visual threat, doll's eyes present, not tracking, PERRL. Corneal reflex present left stronger than right, gag and cough present. Right facial droop seen when pt grimace.  Tongue protrusion not cooperative. On pain stimulation, extension of LUE, but no movement of other extremities. DTR diminished and no babinski. Sensation, coordination and gait not tested.   ASSESSMENT/PLAN [name] is a 65 y.o. male with history of atrial flutter newly diagnosed with atrial fibrillation in February 2022 not on anticoagulation, follows with Dr. Johney Frame in cardiology.  He presented with right-sided weakness, slurred speech, headache, and vomiting.Last known well when he went to bed at 10 PM on  09/24/2020 and woke up at around 2 AM on the day of admission with slurred speech.  EMS was called.He was very drowsy upon their assessment and notable right side weakeness.  He had worsening mental status, GCS 5, so was emergently intubated in ED for airway protection.  His MRI brain showed acute hemorrhage at left midbrain extending into pons with IVH associated with obstructive hydrocephalus.    ICH - Left Dorsalmidbrain ICHextending into dorsal pons and inferior thalamus with mild mass-effect on third ventricle and hydrocephalus,source unknown, suspect occult cavernoma  Code Stroke CT head-Acute IPH in the dorsal left pons with subarachnoid extension into the cerebral aqueduct and fourth ventricle.  CTA head & neck-No vascular malformation, anomaly or aneurysm. No emergent large vessel occlusion or high-grade stenosis. Unchanged size of dorsal left pontine hemorrhage with intraventricular extension.  MRI& MR Venogram-Acute hemorrhage centered within the left dorsal midbrain extending into the dorsal pons and inferomedial thalamus with intraventricular extension. Compression of the cerebral aqueduct with possible mild obstructive hydrocephalus. No evidence of dural sinus thrombosis.   CT Head W/O Contrast 3/29 0250 -Unchanged left brainstem hemorrhage extending into the fourth ventricle. Worsening  hydrocephalus.  Diagnostic Cerebral Angiogram - No evidence of an aneurysm, AVM, dural AV fistula or other vascular abnormality to explain patient's known intraparenchymal hemorrhage.  2D Echo- EF:55-60%. No wall motion abnormalities.  LDL- 88  HgbA1c5.7  VTE prophylaxis -subcu heparin  No antithromboticprior to admission, now on no antithrombotics due to ICH. Per Dr. Pearlean Brownie, consider anticoagulation in 2 weeks given afib and b/l DVTs.  Therapy recommendations:CIR  Disposition:Pending  Obstructive hydrocephalus   EVD placed on 3/30   CT 4/1 showed clot in right lateral  ventricle around catheter.   S/p 1mg  (1cc) alteplase placed in EVD 09/29/20 -> drain well ->ICP measuring 4-11 with a good waveform  CT head (4/3) resolution of most of the IVH and improved hydrocephalus.  EVD stopped working 4/2 overnight. 0.5mg  alteplase placed in EVD 10/01/2020 -> Post alteplase CT head new hemorrhage along the EVD tract an in the right lateral ventricle.  CT repeat 4/4 - decreased IVH and decompressed right lateral ventricle  CT repeat 4/5 -mild interval enlargement of ventricular size, no significant hydrocephalus.  IVH stable.  HCT 4/7 stable, with no appreciable change in overall ventricular size  HCT 4/13 decreased ventricular size  Cerebral Edema  PICC line placement 4/3 for hypertonic saline solution  Off 3% saline now -> Free water added: 6/3 q 4 hours  Allow Na gradually trending down  Sodium level 155->157->156->155->153...->141  Acute Respiratory Failure  Required intubation on 4/4 due to worsening MS and hypoxia  S/p trach 10/04/20  Now on trach collar  Off sedation  CCM managing  Bilateral DVT  LE venous Doppler 4/10 with bilateral DVTs  Status post IVC filter  Not a candidate for anticoagulation at this time  Per Dr. 6/10, consider anticoagulant in 2 weeks  Aspiration Pneumonia Community-acquired pneumonia with Moraxella Fever   Tmax 101.1->102.4->103.6...-> 100.4->afebrile  Off arctic sun  Sputum culture (3/30): Moraxella  Sputum culture 4/5: neg  Ceftriaxone  dc'ed 4/1, 4/1-4/8 Unasyn 3g q6hr  WBC 10.7->16.3->11.0->9.7->10.3->10.6->12.4...-> 9.8  CXR 4/7, 4/8 left basilar opacities  Blood culture positive for Acinetobacter  On cefepime  PICC removed  CCM following  AKI, resolved  Creatinine 0.74 -> 1.81->1.14->1.08->0.97->1.0..->0.79  On FW and tube feeding  CCM on board  Close monitoring  SIRPIDs Severe encephalopathy   Left UE intermittent jerking with stimulation  LTM EEG: GPEDs with triphasic  wave, generalized SIRPIDs, although focal seizure is in DDx  S/p keppra 10/03/20 -> d/c concerning for angioedema -> on vimpat 10/04/20  EEG also showed moderate to severe diffuse encephalopathy - likely related to fever, pneumonia   MRI brain 4/8 -stable IVH, midbrain ICH and right frontal EVD tract hemorrhage.  Ventricular size stable.  2-3 punctate infarcts left frontal and parietal lobes, corresponding to small acute/subacute infarcts.  Remains minimally responsive off sedation  Repeat spot EEG pending  Angioedema, resolved  Upper lip edema, improving  Concerning for keppra allergy  Hypertension  Home meds:Diltiazem  Currently on Diltiazem 30 mg q6, clonidine 0.2 mg every 6 hours  BP goal Systolic < 160  Long-term BP goal normotensive  Aflutter and Afib  Not on [facility] before admission  S/p CTI ablation for A flutter  off ASA  Not AC candidate at this time.  Afib with RVR overnight 4/6  Per Dr. 6/6, consider anticoagulation in 2 weeks  Hyperlipidemia  Home meds:none  LDL88, goal < 70  Addstatin once mental status improved  Dysphagia  Secondary to hemorrhagic stroke  NPO  SLP on board  Tube  feeds @ 29ml/hr and FW 150 Q4  likely require PEG  Other Stroke Risk Factors  Advanced Age >/= 73  Snoring: sleep study denied by insurance prior to admission   Other Active Problems:  Hypokalemia  Hospital day #17  This patient is critically ill due to brainstem hemorrhage, obstructive hydrocephalus, fever, sepsis, altered mental status, A. fib, bilateral DVT and at significant risk of neurological worsening, death form PE, heart failure, hematoma expansion, obstructive hydrocephalus, septic shock, status epilepticus. This patient's care requires constant monitoring of vital signs, hemodynamics, respiratory and cardiac monitoring, review of multiple databases, neurological assessment, discussion with family, other specialists and medical decision  making of high complexity. I spent 35 minutes of neurocritical care time in the care of this patient.  I discussed with Dr. Everardo All.

## 2020-10-12 NOTE — Progress Notes (Signed)
Pt having labored breathing and having non sustained HR in the 40's. Dr. Everardo All notified of vomitng, constant coughing, need for increased O2 and above issues. Orders given for respiratory to place pt back on vent and for them to give a nebulizer. Resp notified.

## 2020-10-12 NOTE — Progress Notes (Signed)
Pt had episode of emesis x 1 after lengthy coughing fit during bath. Dr. Roda Shutters notified. Order for zofran given. O2 increased to 40% trach collar due to sats in the 80's. Resp notified and will come check on pt.

## 2020-10-12 NOTE — Evaluation (Signed)
Passy-Muir Speaking Valve - Evaluation Patient Details  Name: Walter [name] MRN: [id number] Date of Birth: 06-Nov-1955  Today's Date: 10/12/2020 Time: 2355-7322 SLP Time Calculation (min) (ACUTE ONLY): 35 min  Past Medical History:  Past Medical History:  Diagnosis Date  . Atrial flutter (HCC)   . Dizzy 09/25/13  . Near syncope 09/25/13   Past Surgical History:  Past Surgical History:  Procedure Laterality Date  . A-FLUTTER ABLATION N/A 03/09/2020   Procedure: A-FLUTTER ABLATION;  Surgeon: Hillis Range, MD;  Location: [facility] INVASIVE CV LAB;  Service: Cardiovascular;  Laterality: N/A;  . CARDIOVERSION N/A 09/27/2013   Procedure: CARDIOVERSION;  Surgeon: Lewayne Bunting, MD;  Location: [facility] ENDOSCOPY;  Service: Cardiovascular;  Laterality: N/A;  . IR ANGIO EXTERNAL CAROTID SEL EXT CAROTID UNI R MOD SED  09/26/2020  . IR ANGIO INTRA EXTRACRAN SEL COM CAROTID INNOMINATE BILAT MOD SED  09/26/2020  . IR ANGIO VERTEBRAL SEL VERTEBRAL UNI R MOD SED  09/26/2020  . IR IVC FILTER PLMT / S&I /IMG GUID/MOD SED  10/08/2020  . IR US GUIDE VASC ACCESS RIGHT  09/26/2020  . RETINAL DETACHMENT SURGERY    . right knee arthroscopy    . TEE WITHOUT CARDIOVERSION N/A 09/27/2013   Procedure: TRANSESOPHAGEAL ECHOCARDIOGRAM (TEE);  Surgeon: Lewayne Bunting, MD;  Location[facility] ENDOSCOPY;  Service: Cardiovascular;  Laterality: N/A;  . WRIST SURGERY     HPI:  65 yo male presenting 3/28 with R-sided weakness and slurred speech. Imaging revealed acute hemorrhage centered within the left dorsal midbrain extending into the dorsal pons and inferomedial thalamus with intraventricular extension. EVD placement on 3/29. Extubated 3/30. Decrease in EVD drainage 3/31. Repeat CT shows significant clot in R lateral ventricle. EVD clotted 4/2 and intraventricular TpA administered. Reintubated 4/4. Tracheostomy 4/6. PMH Afib, COVID infection (July 2021), and retinal detachment.   Assessment / Plan / Recommendation Clinical Impression   Throughout PMV assessment pt was a passive participant despite maximum tactile stimulation. Slight grimace exhibited with deep trap pressure. Cuff was slowy deflated expectorating mucous via trach and evidence of patent upper airway. Valve donned for range of 30-60 seconds intervals with evidence of decreased mobilization to oral/nasal cavity. Minimal audible vocalizations intermittently present during respiration. HR 68. SpO2 98%, RR ranged 23-38 (averaged 28). Therapist attempted to leave cuff deflated twice with increase to 35 each attempt, therefore, inflated cuff with small volume. [name] and dtr arrived at end of session - educated on PMV evaluation and plans for treatment- answered questions. SLP Visit Diagnosis: Aphonia (R49.1)    SLP Assessment  Patient needs continued Speech Lanaguage Pathology Services    Follow Up Recommendations   (depends on progress)    Frequency and Duration min 2x/week  2 weeks    PMSV Trial PMSV was placed for: intervals of 60 sec Able to redirect subglottic air through upper airway: Yes Able to Attain Phonation: No attempt to phonate Able to Expectorate Secretions: Yes Level of Secretion Expectoration with PMSV: Tracheal Intelligibility: Unable to assess (comment) Respirations During Trial:  (24-38) SpO2 During Trial:  (95-99%) Pulse During Trial: 65 Behavior: Lethargic;No attempt to communicate   Tracheostomy Tube       Vent Dependency  FiO2 (%): 28 %    Cuff Deflation Trial  GO Tolerated Cuff Deflation: Yes Length of Time for Cuff Deflation Trial: 30 min Behavior:  (lethargic) Cuff Deflation Trial - Comments: 30 min        Walter [name] 10/12/2020, 10:53 AM  Walter [name]  Walter [name] M.Ed Nurse, children's [phone] Office [phone]

## 2020-10-12 NOTE — Progress Notes (Signed)
NAME:  Walter [name], MRN:  [id number], DOB:  18-May-1956, LOS: 17 ADMISSION DATE:  09/25/2020, CONSULTATION DATE:  09/25/20 REFERRING MD:  Wilford Corner,  CHIEF COMPLAINT:  Headache, r side weakness, slurred speech  History of Present Illness:  Walter [name] is a 65 yo man with hx of afib not on anticoagulation admitted with right-sided weakness, hemorrhagic stroke. Emergently intubated in the ED for airway protection  Pertinent  Medical History  Aflutter/afib (not on ac) S/p Ablation 03/2020, hx of Cardioversion 2015 Hx retinal detachment Covid infection in July 2021 treated with monoclonal antibody  Significant Hospital Events: Including procedures, antibiotic start and stop dates in addition to other pertinent events   . 3/28- Admit, intubated.  Not a candidate for TPA due to intracranial hemorrhage.  Started 3% saline . 3/29- EVD placement for developing hydrocephalus. CTA of the head was unremarkable . 3/30- Extubated. . 4/1 respiratory culture grew Moraxella, on ceftriaxone . 4/4 reintubated because of hypoxia and unresponsiveness . 4/6 tracheostomy . 4/7 still encephalopathic  . 4/8 remains encephalopathic . 4/10 Doppler shows bilateral DVT. IVC filter placed.  . 4/11 Blood cultures positive for Acinetobacter  . 4/12 Fever no worse off [place], off sedative infusions, tolerating trach collar.  . 4/13 Febrile @ 7am . 4/14 No fevers in the last 24 hours. Remains unresponsive   Interim History / Subjective:   Tolerating trach collar  Objective   Blood pressure 126/64, pulse 65, temperature 99.32 F (37.4 C), resp. rate 20, height 6\' 2"  (1.88 m), weight 105.8 kg, SpO2 96 %.    FiO2 (%):  [28 %-40 %] 28 %   Intake/Output Summary (Last 24 hours) at 10/12/2020 1046 Last data filed at 10/12/2020 1000 Gross per 24 hour  Intake 2229.99 ml  Output 2925 ml  Net -695.01 ml   Filed Weights   10/08/20 0400 10/09/20 0500 10/12/20 0500  Weight: 107.8 kg 106 kg 105.8 kg   Physical  Exam: General: Chronically ill-appearing, unresponsive HENT: [place], AT, OP clear, MMM Eyes: EOMI, PERRL, no scleral icterus Neck: Trach in place, c/d/i Respiratory: Clear to auscultation bilaterally.  No crackles, wheezing or rales Cardiovascular: RRR, -M/R/G, no JVD GI: BS+, soft, nontender Extremities:-Edema,-tenderness Neuro: Does not open eyes or respond to noxious stimuli, no withdrawal, no spontaneous movement  Labs/imaging that I havepersonally reviewed  (right click and "Reselect all SmartList Selections" daily)   WBC 9.8 - leukocytosis has resolved >72 hours Hg 8.7 CMET with no acute abnormalities. Na normalized to 141  CT shows no worsening hydrocephalus, improving hemorrhage Cultures - Acinetobacter in blood with negative follow-up cultures on 4/8  Resolved Hospital Problem list   AKI  Hypernatremia Assessment & Plan:   Acute midbrain/dorsal pons/ thalamic IPH  Acute obstructive hydrocephalus s/p EVD  Bilateral DVT's Acute encephalopathy Acute respiratory failure s/p tracheostomy due to failure to wean Pyrexia - multifactorial: central vs. Acinetobacter bacteremia Afib - currently sinus Thrombocytopenia, mild  Urinary retention  Unclear cause of fever. No fever for >24 hours off cooling blanket and IV sedation. Will continue to discontinue fever-reducing meds to see if their is a change.   Plan:  -Continue acetaminophen, buspirone. DC bromocriptine.  -DC scheduled klonopin and oxycodone. Will place PRN orders -Complete current antibiotics for 7 day course- will cover CSF infection if present. Adjust based on Acinetobacter susceptibilities.  -IVC filter until 4/24 - then should be able to transition to anticoagulation and remove filter.  - Continue open ended trach collar trial  Best Practice (right  click and "Reselect all SmartList Selections" daily)   Diet:  Tube Feed  on fiber for diarrhea - enteral oxycodone may help as would a conversion to intermittent  feeding Pain/Anxiety/Delirium protocol (if indicated): RASS goal -1. Convert to enteral agents.  VAP protocol (if indicated): Yes DVT prophylaxis: SCD heparin - IVC filter in place. Consider starting full dose AC on 4/24 and retrieving filter.  GI prophylaxis: PPI Glucose control:  SSI Yes Central venous access:  N/A Arterial line:  N/A Foley:  Yes, and it is no longer needed-- will trial dc 4/12 Mobility:  bed rest  PT consulted: Yes Code Status:  full code Prognosis: Guarded Last date of multidisciplinary goals of care discussion 4/12. Spoke to wife with Dr Pearlean Brownie. Both of [place] explained that while source of fever remains unclear, we are currently covering all bases and that the patient likely simply needs time.   The patient is critically ill with multiple organ systems failure and requires high complexity decision making for assessment and support, frequent evaluation and titration of therapies, application of advanced monitoring technologies and extensive interpretation of multiple databases.  Independent Critical Care Time: 45 Minutes.   Mechele Collin, M.D. [facility] Pulmonary/Critical Care Medicine 10/12/2020 10:46 AM   Please see Amion for pager number to reach on-call Pulmonary and Critical Care Team.

## 2020-10-13 ENCOUNTER — Inpatient Hospital Stay (HOSPITAL_COMMUNITY): Payer: Medicare Other

## 2020-10-13 DIAGNOSIS — G9340 Encephalopathy, unspecified: Secondary | ICD-10-CM | POA: Diagnosis not present

## 2020-10-13 DIAGNOSIS — J9621 Acute and chronic respiratory failure with hypoxia: Secondary | ICD-10-CM | POA: Diagnosis not present

## 2020-10-13 DIAGNOSIS — R4182 Altered mental status, unspecified: Secondary | ICD-10-CM

## 2020-10-13 DIAGNOSIS — I613 Nontraumatic intracerebral hemorrhage in brain stem: Secondary | ICD-10-CM | POA: Diagnosis not present

## 2020-10-13 DIAGNOSIS — R9401 Abnormal electroencephalogram [EEG]: Secondary | ICD-10-CM

## 2020-10-13 LAB — CBC
HCT: 28 % — ABNORMAL LOW (ref 39.0–52.0)
Hemoglobin: 9.1 g/dL — ABNORMAL LOW (ref 13.0–17.0)
MCH: 31.9 pg (ref 26.0–34.0)
MCHC: 32.5 g/dL (ref 30.0–36.0)
MCV: 98.2 fL (ref 80.0–100.0)
Platelets: 209 10*3/uL (ref 150–400)
RBC: 2.85 MIL/uL — ABNORMAL LOW (ref 4.22–5.81)
RDW: 14.4 % (ref 11.5–15.5)
WBC: 8.5 10*3/uL (ref 4.0–10.5)
nRBC: 0.4 % — ABNORMAL HIGH (ref 0.0–0.2)

## 2020-10-13 LAB — BASIC METABOLIC PANEL
Anion gap: 5 (ref 5–15)
BUN: 24 mg/dL — ABNORMAL HIGH (ref 8–23)
CO2: 25 mmol/L (ref 22–32)
Calcium: 8.2 mg/dL — ABNORMAL LOW (ref 8.9–10.3)
Chloride: 105 mmol/L (ref 98–111)
Creatinine, Ser: 0.77 mg/dL (ref 0.61–1.24)
GFR, Estimated: >60 mL/min (ref >60)
Glucose, Bld: 154 mg/dL — ABNORMAL HIGH (ref 70–99)
Potassium: 4.1 mmol/L (ref 3.5–5.1)
Sodium: 135 mmol/L (ref 135–145)

## 2020-10-13 LAB — GLUCOSE, CAPILLARY
Glucose-Capillary: 108 mg/dL — ABNORMAL HIGH (ref 70–99)
Glucose-Capillary: 140 mg/dL — ABNORMAL HIGH (ref 70–99)
Glucose-Capillary: 147 mg/dL — ABNORMAL HIGH (ref 70–99)
Glucose-Capillary: 150 mg/dL — ABNORMAL HIGH (ref 70–99)
Glucose-Capillary: 155 mg/dL — ABNORMAL HIGH (ref 70–99)
Glucose-Capillary: 168 mg/dL — ABNORMAL HIGH (ref 70–99)

## 2020-10-13 MED ORDER — GLYCOPYRROLATE 1 MG PO TABS
1.0000 mg | ORAL_TABLET | Freq: Two times a day (BID) | ORAL | Status: DC
  Administered 2020-10-13 – 2020-10-14 (×3): 1 mg
  Filled 2020-10-13 (×5): qty 1

## 2020-10-13 MED ORDER — OXYCODONE HCL 5 MG PO TABS
5.0000 mg | ORAL_TABLET | Freq: Four times a day (QID) | ORAL | Status: DC | PRN
  Administered 2020-10-18: 5 mg
  Filled 2020-10-13: qty 1

## 2020-10-13 MED ORDER — GUAIFENESIN-DM 100-10 MG/5ML PO SYRP
5.0000 mL | ORAL_SOLUTION | ORAL | Status: DC | PRN
  Filled 2020-10-13: qty 5

## 2020-10-13 MED ORDER — SODIUM CHLORIDE 0.9% FLUSH: 10.0000 mL | INTRAVENOUS | Status: DC | PRN

## 2020-10-13 MED ORDER — FUROSEMIDE 10 MG/ML IJ SOLN
40.0000 mg | Freq: Once | INTRAMUSCULAR | Status: AC
  Administered 2020-10-13: 40 mg via INTRAVENOUS
  Filled 2020-10-13: qty 4

## 2020-10-13 MED ORDER — SODIUM CHLORIDE 0.9% FLUSH
10.0000 mL | Freq: Two times a day (BID) | INTRAVENOUS | Status: DC
  Administered 2020-10-14: 20 mL
  Administered 2020-10-14 – 2020-10-24 (×19): 10 mL
  Administered 2020-10-24: 20 mL
  Administered 2020-10-25 – 2020-10-28 (×7): 10 mL

## 2020-10-13 MED ORDER — SODIUM CHLORIDE 0.9 % IV SOLN
INTRAVENOUS | Status: DC
  Administered 2020-10-13: 25 mL/h via INTRAVENOUS

## 2020-10-13 NOTE — Procedures (Signed)
Patient Name: Walter [name]  MRN: [id number]  Epilepsy Attending: [name]  Referring Physician/Provider: Dr. Marvel Plan Date: 10/13/2020 Duration: 23.47 minutes  Patient history: 69 old male with left upper extremity shaking. EEG done for seizures.   Level of alertness: Awake  AEDs during EEG study: None  Technical aspects: This EEG study was done with scalp electrodes positioned according to the 10-20 International system of electrode placement. Electrical activity was acquired at a sampling rate of 500Hz  and reviewed with a high frequency filter of 70Hz  and a low frequency filter of 1Hz . EEG data were recorded continuously and digitally stored.   Description: No clear posterior dominant rhythm was seen.  EEG showed continuous generalized 6 to 8 Hz theta-alpha activity as well as intermittent generalized sharply contoured 2 to 3 Hz delta slowing.  Hyperventilation and photic stimulation were not performed.     ABNORMALITY -Continuous slow, generalized  IMPRESSION: This study is suggestive of moderate diffuse encephalopathy, nonspecific etiology. No seizures or definite epileptiform discharges were seen throughout the recording.  Madelline Eshbach 

## 2020-10-13 NOTE — Progress Notes (Addendum)
Occupational Therapy Treatment Patient Details Name: Walter [name] MRN: [id number] DOB: 08/21/1955 Today's Date: 10/13/2020    History of present illness 65 yo male presenting 3/28 with R-sided weakness and slurred speech. Imaging revealed acute hemorrhage centered within the left dorsal midbrain extending into the dorsal pons and inferomedial thalamus with intraventricular extension. EVD placement for developing hydrocephalus on 3/29. Extubated 3/30. Pt with noted decrease in EVD drainage overnight 3/31, proximal and distal clots noted, flushed, repeat CT shows significant clot in R lateral ventricle. EVD with another episode of clotting overnight 4/2, intraventricular TpA administered. Reintubated 4/4. 4/6 tracheostomy. 4/10 Doppler shows bilateral DVT. IVC filter placed. PMH Afib, COVID infection (July 2021), and retinal detachment.   OT comments  Pt progressing slowly towards established OT goals and presenting with increased arousal this session. Pt requiring Total A +2 for peri care at bed level and rolling. Pt participating in [facility] of BUEs; continues to present with weakness and edema. Total A +2 for bed mobility and Max A for sitting balance at EOB. Pt tolerating sitting at EOB for ~10 minutes. Pt continues to present with decreased cognition, vision, balance, strength, and activity tolerance. VSS on trach collar. Continue to recommend dc to SNF and will continue to follow acutely as admitted. Updated goals.   Follow Up Recommendations  SNF    Equipment Recommendations  Wheelchair cushion (measurements OT);Wheelchair (measurements OT);Hospital bed;Other (comment) (respiratory needs at this time)    Recommendations for Other Services Other (comment) (Palliative)    Precautions / Restrictions Precautions Precautions: Fall Precaution Comments: EVD clamped during session Required Braces or Orthoses: Other Brace Other Brace: BUE resting hand splints, see schedule        Mobility Bed Mobility Overal bed mobility: Needs Assistance Bed Mobility: Rolling;Sidelying to Sit;Sit to Sidelying Rolling: Total assist;+2 for physical assistance Sidelying to sit: Total assist;+2 for physical assistance     Sit to sidelying: Total assist;+2 for physical assistance General bed mobility comments: Total A for bringing BLEs towards EOB and then elevate trunk.    Transfers                 General transfer comment: Defer due to arousal level    Balance Overall balance assessment: Needs assistance Sitting-balance support: No upper extremity supported;Feet supported Sitting balance-Leahy Scale: Poor Sitting balance - Comments: Able to achieving sitting staticly for a couple secs with light assistance. Requiring max A for fall prevention and correcting balance Postural control: Posterior lean;Right lateral lean                                 ADL either performed or assessed with clinical judgement   ADL Overall ADL's : Needs assistance/impaired                             Toileting- Clothing Manipulation and Hygiene: Total assistance;Bed level Toileting - Clothing Manipulation Details (indicate cue type and reason): Total A for peri care after leaking from flexi seal       General ADL Comments: Total A for ADLs     Vision   Vision Assessment?: Yes Eye Alignment: Impaired (comment) (dysconjugate gaze)   Perception     Praxis      Cognition Arousal/Alertness: Awake/alert Behavior During Therapy: Flat affect Overall Cognitive Status: Difficult to assess  General Comments: Increased arousal this session. Pt following 75% of simple, direct commands (attempting to follow). Fatigues quickly. Pt with poor visual tracking; pt making more eye contact when therapists positioned on R        Exercises Exercises: General Upper Extremity;Other exercises General Exercises - Upper  Extremity Shoulder Flexion: PROM;Both;10 reps;Supine Elbow Flexion: PROM;Both;10 reps;Supine Elbow Extension: PROM;Right;AROM;Left;10 reps;Supine Wrist Flexion: PROM;Both;10 reps;Supine Wrist Extension: PROM;Both;10 reps;Supine Other Exercises Other Exercises: Lateral cervical right rotation and lateral flexion x 2 each Other Exercises: lateral leans while seated; x2   Shoulder Instructions       General Comments VSS on trach collar    Pertinent Vitals/ Pain       Pain Assessment: Faces Faces Pain Scale: No hurt Pain Intervention(s): Monitored during session  Home Living                                          Prior Functioning/Environment              Frequency  Min 2X/week        Progress Toward Goals  OT Goals(current goals can now be found in the care plan section)  Progress towards OT goals: Progressing toward goals  Acute Rehab OT Goals Patient Stated Goal: Unstated OT Goal Formulation: With patient Time For Goal Achievement: 10/27/20 Potential to Achieve Goals: Fair ADL Goals Pt Will Perform Grooming: with max assist;bed level Pt Will Perform Upper Body Dressing: with max assist;bed level Pt Will Transfer to Toilet: with +2 assist;with max assist;bedside commode;stand pivot transfer Additional ADL Goal #1: Pt will perform bed mobility with Max A +2 in preparation for ADLs Additional ADL Goal #2: pt will tolerate sitting EOB 10 minutes with mod (A) as precursor to adls  Plan Discharge plan remains appropriate    Co-evaluation    PT/OT/SLP Co-Evaluation/Treatment: Yes Reason for Co-Treatment: Complexity of the patient's impairments (multi-system involvement);To address functional/ADL transfers;For patient/therapist safety;Necessary to address cognition/behavior during functional activity   OT goals addressed during session: ADL's and self-care      AM-PAC OT "6 Clicks" Daily Activity     Outcome Measure   Help from another  person eating meals?: Total Help from another person taking care of personal grooming?: Total Help from another person toileting, which includes using toliet, bedpan, or urinal?: Total Help from another person bathing (including washing, rinsing, drying)?: Total Help from another person to put on and taking off regular upper body clothing?: Total Help from another person to put on and taking off regular lower body clothing?: Total 6 Click Score: 6    End of Session Equipment Utilized During Treatment: Oxygen  OT Visit Diagnosis: Unsteadiness on feet (R26.81);Other abnormalities of gait and mobility (R26.89);Muscle weakness (generalized) (M62.81);Hemiplegia and hemiparesis Hemiplegia - Right/Left: Right Hemiplegia - dominant/non-dominant: Dominant Hemiplegia - caused by: Cerebral infarction   Activity Tolerance Patient tolerated treatment well   Patient Left in bed;with call bell/phone within reach;with bed alarm set;with family/visitor present;with nursing/sitter in room;Other (comment) (trach)   Nurse Communication Mobility status;Precautions        Time: [phone] OT Time Calculation (min): 41 min  Charges: OT General Charges $OT Visit: 1 Visit OT Treatments $Therapeutic Activity: 23-37 mins  Zori Benbrook MSOT, OTR/L Acute Rehab Pager: [phone] Office: [phone]   Theodoro Grist Liberti [place] 10/13/2020, 5:55 PM

## 2020-10-13 NOTE — Progress Notes (Signed)
STROKE TEAM PROGRESS NOTE   INTERVAL HISTORY RN and two daughters are at the bedside.  Patient lying in bed, on trach, eyes open today, able to track to voice, and turn face towards voice, attempted to answer questions, able to follow limited central commands and some peripheral commands, but extremely lethargic and not spontaneous moving extremities against gravity. Overnight no fever, vitals stable. Still has copious secretions from trach, needed frequent suctioning.   Vitals:   10/13/20 0900 10/13/20 1000 10/13/20 1100 10/13/20 1152  BP: 134/63 131/60    Pulse: 63 67 73 71  Resp: (!) 33 (!) 34 (!) 31 (!) 32  Temp: 98.78 F (37.1 C) 98.6 F (37 C) 98.6 F (37 C) 98.6 F (37 C)  TempSrc:      SpO2: 97% 97% 100% 99%  Weight:      Height:       CBC:  Recent Labs  Lab 10/12/20 0242 10/12/20 1527 10/13/20 0312  WBC 9.8  --  8.5  HGB 8.7* 9.2* 9.1*  HCT 27.7* 27.0* 28.0*  MCV 101.5*  --  98.2  PLT 182  --  209   Basic Metabolic Panel:  Recent Labs  Lab 10/06/20 1845 10/07/20 0236 10/08/20 0132 10/12/20 0242 10/12/20 1527 10/13/20 0312  NA  --  151*   < > 141 140 135  K  --  4.1   < > 4.0 4.4 4.1  CL  --  118*   < > 109  --  105  CO2  --  26   < > 24  --  25  GLUCOSE  --  163*   < > 148*  --  154*  BUN  --  28*   < > 25*  --  24*  CREATININE  --  0.91   < > 0.79  --  0.77  CALCIUM  --  8.5*   < > 7.9*  --  8.2*  MG 2.3 2.2  --   --   --   --    < > = values in this interval not displayed.    IMAGING  CT head 4/6 1. Ventricle size shows mild interval enlargement since yesterday. No significant hydrocephalus. Intraventricular hemorrhage stable 2. Right frontal ventricular drain unchanged. Hemorrhage surrounding the drain unchanged. 3. Subarachnoid hemorrhage unchanged. Hemorrhage in the left tectum unchanged. No new hemorrhage identified.  CT head 4/4 1. Interval decrease in intraventricular hemorrhage with decreased size of the lateral ventricles as  compared to prior, right greater than left. Right parietal approach ventriculostomy in stable position with tip near the septum pellucidum. 2. Stable small volume subarachnoid and intraparenchymal hemorrhage along the catheter tract. 3. Stable 1.9 cm hemorrhage at the left dorsal pons. 4. No other new acute intracranial abnormality. 09/29/2020  CT head 4/3 IMPRESSION: 1. New intraventricular hemorrhage in the third and lateral ventricles. Fourth ventricular and midbrain hemorrhage is unchanged. 2. New right frontal EVD which traverses the right lateral ventricle. Blood clot encompasses the lower catheter and ventriculomegaly is similar to prior. 3. Small cortical infarcts are newly seen along the superior left frontal convexity.  09/26/2020 IR Angio IMPRESSION: 1. No evidence of an AVM, dural arteriovenous fistula, aneurysm or other vascular abnormality to explain patient's brainstem hemorrhage.  09/26/2020 CT head:  IMPRESSION: 1. Unchanged left brainstem hemorrhage extending into the fourth ventricle. 2. Worsening hydrocephalus.  09/25/2020 MRI brain w/wo, MR Venogram IMPRESSION: 1. Acute hemorrhage centered within the left dorsal midbrain extending into the dorsal  pons and inferomedial thalamus with intraventricular extension. No abnormal enhancement to suggest underlying lesion. 2. Compression of the cerebral aqueduct with possible mild obstructive hydrocephalus. 3. No evidence of dural sinus thrombosis. Attenuation of the left basal vein in the quadrigeminal cistern without definite occlusion.  09/25/2020 CT Angio head/neck IMPRESSION: 1. No vascular malformation, anomaly or aneurysm. 2. No emergent large vessel occlusion or high-grade stenosis of the intracranial arteries. 3. Unchanged size of dorsal left pontine hemorrhage with intraventricular extension.  09/25/2020 CT head code stroke IMPRESSION: Acute intraparenchymal hemorrhage in the dorsal left pons  with subarachnoid extension into the cerebral aqueduct and fourth Ventricle.  EEG-LTM 4/5-4/6  This study showed periodic discharges with triphasic morphology at 1.5 to 2.5 Hz which is on the ictal-interictal continuum with low to intermediate potential for seizures.  Additionally there is evidence of moderate to severe diffuse encephalopathy, nonspecific etiology.  No seizures were seen during the study  CT HEAD WO CONTRAST  Result Date: 10/11/2020 CLINICAL DATA:  65 year old male with left dorsal midbrain hemorrhage, intraventricular extension presenting on 09/25/2020. Several superimposed small infarcts. Subsequent encounter. EXAM: CT HEAD WITHOUT CONTRAST TECHNIQUE: Contiguous axial images were obtained from the base of the skull through the vertex without intravenous contrast. COMPARISON:  Brain MRI 10/06/2020 and earlier. FINDINGS: Brain: Mildly decreased ventricle size since 10/06/2020. Stable small volume of intraventricular hemorrhage, mostly layering in the occipital horns. Small parenchymal hematoma along the course of the right EVD is stable since 10/06/2020 (series 3, images 27-31) with confluent regional edema. There is a small volume of right superior frontal gyrus subarachnoid hemorrhage associated and stable (image 32 today). Additional small volume subarachnoid hemorrhage over both convexities elsewhere. Decreasing hyperdense blood products at the left midbrain and thalamus since 10/06/2020. Continued heterogeneous edema and/or developing encephalomalacia at those sites. Decreased conspicuity of the left cerebellar PICA infarct from earlier this month. Small foci of left frontal or restricted diffusion recently on MRI are largely occult by CT. No new major vascular territory infarct identified. No midline shift.  Normal basilar cisterns. Vascular: Mild Calcified atherosclerosis at the skull base. Skull: No acute osseous abnormality identified. Stable burr hole from the previous right  superior approach EVD. Sinuses/Orbits: Increased bilateral middle ear and mastoid opacification. Stable left nasoenteric tube. Paranasal sinus aeration remains satisfactory. Other: Sequelae of the right superior approach EVD. Stable orbit and scalp soft tissues. IMPRESSION: 1. Decreased, improved ventricle size since 10/06/2020 with stable small volume of IVH. 2. Hematoma and edema along the right superior approach EVD tract is stable, including a small volume of bilateral SAH. 3. Expected evolution of the left midbrain/thalamic hemorrhage and edema. 4. Small additional scattered subacute infarcts remain largely occult by CT. 5. No new intracranial abnormality identified. Electronically Signed   By: Odessa Fleming M.D.   On: 10/11/2020 04:43     PHYSICAL EXAM  Temp:  [98.6 F (37 C)-100.04 F (37.8 C)] 98.6 F (37 C) (04/15 1152) Pulse Rate:  [58-73] 71 (04/15 1152) Resp:  [19-34] 32 (04/15 1152) BP: (110-144)/(59-70) 131/60 (04/15 1000) SpO2:  [89 %-100 %] 99 % (04/15 1152) FiO2 (%):  [30 %-35 %] 35 % (04/15 1152) Weight:  [106 kg] 106 kg (04/15 0344)  General - Well nourished, well developed, on trach collar.  Ophthalmologic - fundi not visualized due to noncooperation.  Cardiovascular - Regular rate and rhythm, not in afib.  Neuro - on trach collar, eyes spontaneously open, able to follow commands with tongue protrusion, wiggle toes bilaterally, however did not  follow other commands. With eye opening, eyes in mid position, not consistently blinking to visual threat, doll's eyes present, intermittent tracking to voice, PERRL. Corneal reflex, gag and cough present. Right facial droop seen when pt grimace.  Tongue protrusion mid line. On pain stimulation, no significant movement of all extremities. However, able to wiggle toes bilaterally on request. DTR diminished and no babinski. Sensation, coordination and gait not tested.   ASSESSMENT/PLAN Walter [name] is a 65 y.o. male with history of  atrial flutter newly diagnosed with atrial fibrillation in February 2022 not on anticoagulation, follows with Dr. Johney Frame in cardiology.  He presented with right-sided weakness, slurred speech, headache, and vomiting.Last known well when he went to bed at 10 PM on 09/24/2020 and woke up at around 2 AM on the day of admission with slurred speech.  EMS was called.He was very drowsy upon their assessment and notable right side weakeness.  He had worsening mental status, GCS 5, so was emergently intubated in ED for airway protection.  His MRI brain showed acute hemorrhage at left midbrain extending into pons with IVH associated with obstructive hydrocephalus.    ICH - Left Dorsalmidbrain ICHextending into dorsal pons and inferior thalamus with mild mass-effect on third ventricle and hydrocephalus,source unknown, suspect occult cavernoma  Code Stroke CT head-Acute IPH in the dorsal left pons with subarachnoid extension into the cerebral aqueduct and fourth ventricle.  CTA head & neck-No vascular malformation, anomaly or aneurysm. No emergent large vessel occlusion or high-grade stenosis. Unchanged size of dorsal left pontine hemorrhage with intraventricular extension.  MRI& MR Venogram-Acute hemorrhage centered within the left dorsal midbrain extending into the dorsal pons and inferomedial thalamus with intraventricular extension. Compression of the cerebral aqueduct with possible mild obstructive hydrocephalus. No evidence of dural sinus thrombosis.   CT Head W/O Contrast 3/29 0250 -Unchanged left brainstem hemorrhage extending into the fourth ventricle. Worsening hydrocephalus.  Diagnostic Cerebral Angiogram - No evidence of an aneurysm, AVM, dural AV fistula or other vascular abnormality to explain patient's known intraparenchymal hemorrhage.  MRI brain 4/8 -stable IVH, midbrain ICH and right frontal EVD tract hemorrhage.  Ventricular size stable.  2-3 punctate infarcts left frontal and  parietal lobes, corresponding to small acute/subacute infarcts.  CT head 4/13 stable IVH, ICH, decreased ventricle size  2D Echo- EF:55-60%. No wall motion abnormalities.  LDL- 88  HgbA1c5.7  VTE prophylaxis -subcu heparin  No antithromboticprior to admission, now on no antithrombotics due to ICH. Per Dr. Pearlean Brownie, consider anticoagulation in 2 weeks given afib and b/l DVTs.  Therapy recommendations:CIR  Disposition:Pending  Obstructive hydrocephalus   EVD placed on 3/30   CT 4/1 showed clot in right lateral ventricle around catheter.   S/p 1mg  (1cc) alteplase placed in EVD 09/29/20 -> drain well ->ICP measuring 4-11 with a good waveform  CT head (4/3) resolution of most of the IVH and improved hydrocephalus.  EVD stopped working 4/2 overnight. 0.5mg  alteplase placed in EVD 10/01/2020 -> Post alteplase CT head new hemorrhage along the EVD tract an in the right lateral ventricle.  CT repeat 4/4 - decreased IVH and decompressed right lateral ventricle  CT repeat 4/5 -mild interval enlargement of ventricular size, no significant hydrocephalus.  IVH stable.  HCT 4/7 stable, with no appreciable change in overall ventricular size  HCT 4/13 decreased ventricular size  Acute Respiratory Failure  Required intubation on 4/4 due to worsening MS and hypoxia  S/p trach 10/04/20  Now on trach collar  Off sedation  On trach  collar  Bilateral DVT  LE venous Doppler 4/10 with bilateral DVTs  Status post IVC filter  Not a candidate for anticoagulation at this time  Per Dr. Pearlean Brownie, consider anticoagulant in 2 weeks  Aspiration Pneumonia Community-acquired pneumonia with Moraxella Fever   Tmax 101.1->102.4->103.6...-> 100.4->afebrile->afebrile   Off arctic sun  Sputum culture (3/30): Moraxella  Sputum culture 4/5: neg  Ceftriaxone  dc'ed 4/1, 4/1-4/8 Unasyn 3g q6hr  WBC 10.7->16.3->11.0->9.7->10.3->10.6->12.4...-> 9.8->8.5  CXR 4/7, 4/8 left basilar  opacities  Blood culture 4/8 positive for Acinetobacter  On cefepime, off vanco  PICC removed  CCM following  SIRPIDs, resolved Severe encephalopathy, improving  Left UE intermittent jerking with stimulation  LTM EEG: GPEDs with triphasic wave, generalized SIRPIDs, although focal seizure is in DDx  S/p keppra 10/03/20 -> d/c concerning for angioedema -> on vimpat 10/04/20-> off vimpat 10/11/20  EEG 4/4 moderate to severe diffuse encephalopathy   Repeat spot EEG 4/15 pending  Angioedema, resolved  Upper lip edema, improving  Concerning for keppra allergy  Hypertension  Home meds:Diltiazem  Currently on Diltiazem 30 mg q6, tapering clonidine  BP goal Systolic < 160  Long-term BP goal normotensive  Aflutter and Afib  Not on [facility] before admission  S/p CTI ablation for A flutter  off ASA  Not AC candidate at this time.  Afib with RVR overnight off amiodarone  Per Dr. Pearlean Brownie, consider anticoagulation in 2 weeks  Hyperlipidemia  Home meds:none  LDL88, goal < 70  Addstatin once mental status further improved  Dysphagia  Secondary to hemorrhagic stroke  NPO  SLP on board  Tube feeds @ 39ml/hr   May require PEG  Other Stroke Risk Factors  Advanced Age >/= 54  Snoring: sleep study denied by insurance prior to admission   Other Active Problems:  Hypokalemia  Hospital day #18  This patient is critically ill due to severe encephalopathy, ICH, IVH, hydrocephalus, SIRPEDs, and at significant risk of neurological worsening, death form hematoma expansion, obstructive hydrocephalus, seizure, sepsis. This patient's care requires constant monitoring of vital signs, hemodynamics, respiratory and cardiac monitoring, review of multiple databases, neurological assessment, discussion with family, other specialists and medical decision making of high complexity. I spent 40 minutes of neurocritical care time in the care of this patient. I had long discussion  with two daughters at bedside, updated pt current condition, treatment plan and potential prognosis, and answered all the questions. They expressed understanding and appreciation.   Marvel Plan, MD PhD Stroke Neurology 10/13/2020 12:15 PM

## 2020-10-13 NOTE — Progress Notes (Signed)
Per PCCM, ATC during the day and FS mechanical ventilation QHS to help prevent fatigue. Verbal order, vent order adjusted.

## 2020-10-13 NOTE — Progress Notes (Signed)
NAME:  Walter [name], MRN:  [id number], DOB:  June 13, 1956, LOS: 18 ADMISSION DATE:  09/25/2020, CONSULTATION DATE:  09/25/20 REFERRING MD:  Walter [name],  CHIEF COMPLAINT:  Headache, r side weakness, slurred speech  History of Present Illness:  Walter [name] is a 65 yo man with hx of afib not on anticoagulation admitted with right-sided weakness, hemorrhagic stroke. Emergently intubated in the ED for airway protection  Pertinent  Medical History  Aflutter/afib (not on ac) S/p Ablation 03/2020, hx of Cardioversion 2015 Hx retinal detachment Covid infection in July 2021 treated with monoclonal antibody  Significant Hospital Events: Including procedures, antibiotic start and stop dates in addition to other pertinent events   . 3/28- Admit, intubated.  Not a candidate for TPA due to intracranial hemorrhage.  Started 3% saline . 3/29- EVD placement for developing hydrocephalus. CTA of the head was unremarkable . 3/30- Extubated. . 4/1 respiratory culture grew Moraxella, on ceftriaxone . 4/4 reintubated because of hypoxia and unresponsiveness . 4/6 tracheostomy . 4/7 still encephalopathic  . 4/8 remains encephalopathic . 4/10 Doppler shows bilateral DVT. IVC filter placed.  . 4/11 Blood cultures positive for Acinetobacter  . 4/12 Fever no worse off [place] Sun, off sedative infusions, tolerating trach collar.  . 4/13 Febrile @ 7am . 4/14 No fevers in the last 24 hours. Remains unresponsive. Weaned sedating meds . 4/15 Afebrile x 48 hours. Following simple commands  Interim History / Subjective:   Tolerating trach collar however fatigued yesterday afternoon and required to be placed back on the vent  Objective   Blood pressure (!) 142/70, pulse 66, temperature 98.96 F (37.2 C), resp. rate (!) 28, height 6\' 2"  (1.88 m), weight 106 kg, SpO2 100 %.    Vent Mode: PRVC FiO2 (%):  [21 %-35 %] 35 % Set Rate:  [20 bmp] 20 bmp Vt Set:  [650 mL] 650 mL PEEP:  [5 cmH20] 5 cmH20 Plateau Pressure:  [15  cmH20-16 cmH20] 15 cmH20   Intake/Output Summary (Last 24 hours) at 10/13/2020 1023 Last data filed at 10/13/2020 0600 Gross per 24 hour  Intake 2100.07 ml  Output 2225 ml  Net -124.93 ml   Filed Weights   10/09/20 0500 10/12/20 0500 10/13/20 0344  Weight: 106 kg 105.8 kg 106 kg     Physical Exam: General: Chronically ill-appearing HENT: [place], AT, OP clear, MMM Neck: Trach in place, secretions present Eyes: PERRL, EOMI, no scleral icterus Respiratory: Upper airway rhonchi bilaterally Cardiovascular: RRR, -M/R/G, no JVD GI: BS+, soft, nontender Extremities:-Edema,-tenderness Neuro: Opens eyes, intermittently follows commands including opening mouth, stick out tongue, turns head and squeezing left hand, no lower extremity withdrawal or movement  Labs/imaging that I havepersonally reviewed  (right click and "Reselect all SmartList Selections" daily)   Hg 9.1 Resolved leukocytosis CMET with no acute abnormalities  CT shows no worsening hydrocephalus, improving hemorrhage Cultures - Acinetobacter in blood with negative follow-up cultures on 4/8  Resolved Hospital Problem list   AKI  Hypernatremia Moraxella catarrhalis pneumonia s/p antibiotics Thrombocytopenia Assessment & Plan:   Acute midbrain/dorsal pons/ thalamic IPH  Acute obstructive hydrocephalus s/p EVD  -Imaging and management per primary team -Hold on anticoagulation  Acute respiratory failure s/p tracheostomy due to failure to wean Moraxella catarrhalis pneumonia -Continue trach collar during day -Full vent support  -Diurese -VAP  Fever of unknown origin: Central vs Acinetobacter bacteremia Unclear cause of fever. No fever for >48 hours off cooling blanket and IV sedation. Stopped bromocriptine yesterday. Will continue to discontinue fever-reducing meds  to see if their is a change.  -Continue acetaminophen. DC buspirone today.  -Complete current antibiotics for 7 day course- will cover CSF infection if  present.  Acute encephalopathy -Minimize sedating medication -Will start PRN oxycodone  Bilateral DVT's  -IVC filter until 4/24 - then should be able to transition to anticoagulation and remove filter.   Atrial fibrillation -Telemetry  Urinary retention -Follow in place  Best Practice (right click and "Reselect all SmartList Selections" daily)   Diet:  Tube Feed  on fiber for diarrhea - enteral oxycodone may help as would a conversion to intermittent feeding Pain/Anxiety/Delirium protocol (if indicated): RASS goal -1. Convert to enteral agents.  VAP protocol (if indicated): Yes DVT prophylaxis: SCD heparin - IVC filter in place. Consider starting full dose AC on 4/24 and retrieving filter.  GI prophylaxis: PPI Glucose control:  SSI Yes Central venous access:  N/A Arterial line:  N/A Foley:  Yes, and it is no longer needed-- will trial dc 4/12 Mobility:  bed rest  PT consulted: Yes Code Status:  full code Prognosis: Guarded Last date of multidisciplinary goals of care discussion 4/12 with primary team  The patient is critically ill with multiple organ systems failure and requires high complexity decision making for assessment and support, frequent evaluation and titration of therapies, application of advanced monitoring technologies and extensive interpretation of multiple databases.  Independent Critical Care Time: .   Mechele Collin, M.D. [facility] Pulmonary/Critical Care Medicine 10/13/2020 10:23 AM   Please see Amion for pager number to reach on-call Pulmonary and Critical Care Team.

## 2020-10-13 NOTE — Progress Notes (Signed)
EEG complete - results pending 

## 2020-10-13 NOTE — Progress Notes (Signed)
Physical Therapy Treatment Patient Details Name: Walter [name] MRN: [id number] DOB: 04-12-1956 Today's Date: 10/13/2020    History of Present Illness 65 yo male presenting 3/28 with R-sided weakness and slurred speech. Imaging revealed acute hemorrhage centered within the left dorsal midbrain extending into the dorsal pons and inferomedial thalamus with intraventricular extension. EVD placement for developing hydrocephalus on 3/29. Extubated 3/30. Pt with noted decrease in EVD drainage overnight 3/31, proximal and distal clots noted, flushed, repeat CT shows significant clot in R lateral ventricle. EVD with another episode of clotting overnight 4/2, intraventricular TpA administered. Reintubated 4/4. 4/6 tracheostomy. 4/10 Doppler shows bilateral DVT. IVC filter placed. PMH Afib, COVID infection (July 2021), and retinal detachment.    PT Comments    The pt was seen by PT/OT to improve safety with bed mobility and transition to sitting EOB. The pt demos significant improvement from prior sessions, initiating some movement with LUE to command to complete Ue exercises as well as ability to consistently complete cervical rotation to verbal stimuli. The pt did not demo any active ROM or movement of either LE other than wiggling of R toes which he performed when cued to complete any movement involving R or L leg. The pt was able to tolerate sitting EOB for ~10 min with +1 assist (minA to maxA) to manage sitting balance. The pt could maintain static sitting for short bouts, but was unable to recover from any slight LOB or challenge. The pt will continue to benefit from skilled PT to progress functional mobility, strength, and activity tolerance.    Follow Up Recommendations  SNF     Equipment Recommendations  Hospital bed;Other (comment) (hoyer lift)    Recommendations for Other Services       Precautions / Restrictions Precautions Precautions: Fall Precaution Comments: EVD clamped during  session Required Braces or Orthoses: Other Brace Other Brace: BUE resting hand splints, see schedule Restrictions Weight Bearing Restrictions: No    Mobility  Bed Mobility Overal bed mobility: Needs Assistance Bed Mobility: Rolling;Sidelying to Sit;Sit to Sidelying Rolling: Total assist;+2 for physical assistance Sidelying to sit: Total assist;+2 for physical assistance     Sit to sidelying: Total assist;+2 for physical assistance General bed mobility comments: Total A for bringing BLEs towards EOB and then elevate trunk.    Transfers                 General transfer comment: Defer due to arousal level     Modified Rankin (Stroke Patients Only) Modified Rankin (Stroke Patients Only) Pre-Morbid Rankin Score: No symptoms Modified Rankin: Severe disability     Balance Overall balance assessment: Needs assistance Sitting-balance support: No upper extremity supported;Feet supported Sitting balance-Leahy Scale: Poor Sitting balance - Comments: Able to achieving sitting staticly for a couple secs with light assistance. Requiring max A for fall prevention and correcting balance Postural control: Posterior lean;Right lateral lean                                  Cognition Arousal/Alertness: Awake/alert Behavior During Therapy: Flat affect Overall Cognitive Status: Difficult to assess                                 General Comments: Increased arousal this session. Pt following 75% of simple, direct commands (attempting to follow). Fatigues quickly. Pt with poor visual tracking; pt making more  eye contact when therapists positioned on R      Exercises General Exercises - Upper Extremity Shoulder Flexion: PROM;Both;10 reps;Supine Elbow Flexion: PROM;Both;10 reps;Supine Elbow Extension: PROM;Right;AROM;Left;10 reps;Supine Wrist Flexion: PROM;Both;10 reps;Supine Wrist Extension: PROM;Both;10 reps;Supine General Exercises - Lower  Extremity Ankle Circles/Pumps: PROM;Both;Supine;10 reps Heel Slides: PROM;Both;10 reps;Supine Hip ABduction/ADduction: PROM;Both;10 reps;Supine Other Exercises Other Exercises: Lateral cervical right rotation and lateral flexion x 2 each Other Exercises: lateral leans while seated; x2    General Comments General comments (skin integrity, edema, etc.): VSS on trach collar through session, increased secretions and need for suction with changes in position      Pertinent Vitals/Pain Pain Assessment: Faces Faces Pain Scale: Hurts a little bit Pain Location: grimacing with flat supine in bed, increased coughing in supine, improved calm with elevation of HOB Pain Descriptors / Indicators: Grimacing Pain Intervention(s): Limited activity within patient's tolerance;Repositioned           PT Goals (current goals can now be found in the care plan section) Acute Rehab PT Goals Patient Stated Goal: Unstated PT Goal Formulation: With patient Time For Goal Achievement: 10/16/20 Potential to Achieve Goals: Fair Progress towards PT goals: Progressing toward goals    Frequency    Min 3X/week      PT Plan Current plan remains appropriate    Co-evaluation PT/OT/SLP Co-Evaluation/Treatment: Yes Reason for Co-Treatment: Complexity of the patient's impairments (multi-system involvement);Necessary to address cognition/behavior during functional activity;For patient/therapist safety;To address functional/ADL transfers PT goals addressed during session: Mobility/safety with mobility;Balance OT goals addressed during session: ADL's and self-care      AM-PAC PT "6 Clicks" Mobility   Outcome Measure  Help needed turning from your back to your side while in a flat bed without using bedrails?: Total Help needed moving from lying on your back to sitting on the side of a flat bed without using bedrails?: Total Help needed moving to and from a bed to a chair (including a wheelchair)?: Total Help  needed standing up from a chair using your arms (e.g., wheelchair or bedside chair)?: Total Help needed to walk in hospital room?: Total Help needed climbing 3-5 steps with a railing? : Total 6 Click Score: 6    End of Session Equipment Utilized During Treatment: Oxygen (trach collar) Activity Tolerance: Patient limited by lethargy Patient left: in bed;with call bell/phone within reach;with SCD's reapplied Nurse Communication: Mobility status PT Visit Diagnosis: Hemiplegia and hemiparesis;Other abnormalities of gait and mobility (R26.89) Hemiplegia - Right/Left: Right Hemiplegia - dominant/non-dominant: Dominant Hemiplegia - caused by: Nontraumatic intracerebral hemorrhage     Time: 5573-2202 PT Time Calculation (min) (ACUTE ONLY): 41 min  Charges:  $Therapeutic Activity: 8-22 mins                     Rolm Baptise, PT, DPT   Acute Rehabilitation Department Pager #: [phone]   Gaetana Michaelis 10/13/2020, 6:13 PM

## 2020-10-14 DIAGNOSIS — Z930 Tracheostomy status: Secondary | ICD-10-CM

## 2020-10-14 DIAGNOSIS — Z9911 Dependence on respirator [ventilator] status: Secondary | ICD-10-CM

## 2020-10-14 DIAGNOSIS — G9340 Encephalopathy, unspecified: Secondary | ICD-10-CM | POA: Diagnosis not present

## 2020-10-14 DIAGNOSIS — I613 Nontraumatic intracerebral hemorrhage in brain stem: Secondary | ICD-10-CM | POA: Diagnosis not present

## 2020-10-14 DIAGNOSIS — I615 Nontraumatic intracerebral hemorrhage, intraventricular: Secondary | ICD-10-CM | POA: Diagnosis not present

## 2020-10-14 DIAGNOSIS — J9610 Chronic respiratory failure, unspecified whether with hypoxia or hypercapnia: Secondary | ICD-10-CM | POA: Diagnosis not present

## 2020-10-14 LAB — BASIC METABOLIC PANEL
Anion gap: 9 (ref 5–15)
BUN: 25 mg/dL — ABNORMAL HIGH (ref 8–23)
CO2: 27 mmol/L (ref 22–32)
Calcium: 8.5 mg/dL — ABNORMAL LOW (ref 8.9–10.3)
Chloride: 103 mmol/L (ref 98–111)
Creatinine, Ser: 0.79 mg/dL (ref 0.61–1.24)
GFR, Estimated: >60 mL/min (ref >60)
Glucose, Bld: 156 mg/dL — ABNORMAL HIGH (ref 70–99)
Potassium: 4.5 mmol/L (ref 3.5–5.1)
Sodium: 139 mmol/L (ref 135–145)

## 2020-10-14 LAB — CBC
HCT: 32.7 % — ABNORMAL LOW (ref 39.0–52.0)
Hemoglobin: 10.4 g/dL — ABNORMAL LOW (ref 13.0–17.0)
MCH: 31 pg (ref 26.0–34.0)
MCHC: 31.8 g/dL (ref 30.0–36.0)
MCV: 97.6 fL (ref 80.0–100.0)
Platelets: 217 10*3/uL (ref 150–400)
RBC: 3.35 MIL/uL — ABNORMAL LOW (ref 4.22–5.81)
RDW: 14.4 % (ref 11.5–15.5)
WBC: 8.7 10*3/uL (ref 4.0–10.5)
nRBC: 0.7 % — ABNORMAL HIGH (ref 0.0–0.2)

## 2020-10-14 LAB — GLUCOSE, CAPILLARY
Glucose-Capillary: 133 mg/dL — ABNORMAL HIGH (ref 70–99)
Glucose-Capillary: 138 mg/dL — ABNORMAL HIGH (ref 70–99)
Glucose-Capillary: 146 mg/dL — ABNORMAL HIGH (ref 70–99)
Glucose-Capillary: 159 mg/dL — ABNORMAL HIGH (ref 70–99)
Glucose-Capillary: 163 mg/dL — ABNORMAL HIGH (ref 70–99)
Glucose-Capillary: 170 mg/dL — ABNORMAL HIGH (ref 70–99)

## 2020-10-14 MED ORDER — FUROSEMIDE 10 MG/ML IJ SOLN
40.0000 mg | Freq: Two times a day (BID) | INTRAMUSCULAR | Status: AC
  Administered 2020-10-14 (×2): 40 mg via INTRAVENOUS
  Filled 2020-10-14 (×2): qty 4

## 2020-10-14 NOTE — Progress Notes (Signed)
2 trach sutures removed, no other visible sutures. RN made aware.

## 2020-10-14 NOTE — Progress Notes (Signed)
NAME:  Walter [name], MRN:  [id number], DOB:  30-Jul-1955, LOS: 19 ADMISSION DATE:  09/25/2020, CONSULTATION DATE:  09/25/20 REFERRING MD:  Wilford Corner,  CHIEF COMPLAINT:  Headache, r side weakness, slurred speech  History of Present Illness:  Walter [name] is a 65 yo man with hx of afib not on anticoagulation admitted with right-sided weakness, hemorrhagic stroke. Emergently intubated in the ED for airway protection  Pertinent  Medical History  Aflutter/afib (not on ac) S/p Ablation 03/2020, hx of Cardioversion 2015 Hx retinal detachment Covid infection in July 2021 treated with monoclonal antibody  Significant Hospital Events: Including procedures, antibiotic start and stop dates in addition to other pertinent events   . 3/28- Admit, intubated.  Not a candidate for TPA due to intracranial hemorrhage.  Started 3% saline . 3/29- EVD placement for developing hydrocephalus. CTA of the head was unremarkable . 3/30- Extubated. . 4/1 respiratory culture grew Moraxella, on ceftriaxone . 4/4 reintubated because of hypoxia and unresponsiveness . 4/6 tracheostomy . 4/7 still encephalopathic  . 4/8 remains encephalopathic . 4/10 Doppler shows bilateral DVT. IVC filter placed.  . 4/11 Blood cultures positive for Acinetobacter  . 4/12 Fever no worse off [place], off sedative infusions, tolerating trach collar.  . 4/13 Febrile @ 7am . 4/14 No fevers in the last 24 hours. Remains unresponsive. Weaned sedating meds . 4/16 Afebrile x 72 hours. Following simple commands and participated in PT  Interim History / Subjective:   Tmax 100.8. PT/OT. Started on robinul for secretions. Drowsy this am.  Objective   Blood pressure 136/66, pulse 73, temperature 98.6 F (37 C), temperature source Axillary, resp. rate (!) 22, height 6\' 2"  (1.88 m), weight 106 kg, SpO2 98 %.    Vent Mode: PRVC FiO2 (%):  [30 %-35 %] 30 % Set Rate:  [20 bmp] 20 bmp Vt Set:  [650 mL] 650 mL PEEP:  [5 cmH20] 5 cmH20 Plateau  Pressure:  [14 cmH20-18 cmH20] 15 cmH20   Intake/Output Summary (Last 24 hours) at 10/14/2020 0733 Last data filed at 10/14/2020 0400 Gross per 24 hour  Intake 2286.73 ml  Output 3200 ml  Net -913.27 ml   Filed Weights   10/09/20 0500 10/12/20 0500 10/13/20 0344  Weight: 106 kg 105.8 kg 106 kg    Physical Exam: General: Chronically ill-appearing, drowsy HENT: [employer], AT, OP clear, MMM Neck: Trach in place, c/d/i, minimal secretions Eyes: EOMI, no scleral icterus Respiratory: Clear to auscultation bilaterally.  No crackles, wheezing or rales Cardiovascular: RRR, -M/R/G, no JVD GI: BS+, soft, nontender Extremities:-Edema,-tenderness Neuro: Drowsy, opens eyes to touch, does not follow commands this morning  Labs/imaging that I havepersonally reviewed  (right click and "Reselect all SmartList Selections" daily)   Hg 10.4 stable BUN/Cr 25/0.79 stable with diuresis  CT 4/13 shows no worsening hydrocephalus, improving hemorrhage Cultures - Acinetobacter in blood with negative follow-up cultures on 4/8  Resolved Hospital Problem list   AKI  Hypernatremia Moraxella catarrhalis pneumonia s/p antibiotics Thrombocytopenia Assessment & Plan:   Acute midbrain/dorsal pons/ thalamic IPH  Acute obstructive hydrocephalus s/p EVD  Acute encephalopathy secondary delirium, sedation -Imaging and management per primary team -Hold on anticoagulation -Minimize sedating drugs. If still drowsy, will DC robinul tomorrow -PRN oxycodone for pain  Acute respiratory failure s/p tracheostomy due to failure to wean Moraxella catarrhalis pneumonia s/p antibiotics -Continue trach collar during day as tolerated -Full vent support at night -Diurese BID today -Scheduled robinul and PRN robitussin -VAP  Fever of unknown origin: Central vs  Acinetobacter bacteremia Unclear cause of fever. S/p 7 day course of Cefepime. No fever for >72 hours off cooling blanket and IV sedation. Stopped bromocriptine and  buspirone.  -Continue acetaminophen PRN for fever  Bilateral DVT's  -IVC filter until 4/24 - then should be able to transition to anticoagulation and remove filter.   Atrial fibrillation -Telemetry  Urinary retention -Follow in place  Best Practice (right click and "Reselect all SmartList Selections" daily)   Diet:  Tube Feed  on fiber for diarrhea - enteral oxycodone may help as would a conversion to intermittent feeding Pain/Anxiety/Delirium protocol (if indicated): RASS goal -1. Convert to enteral agents.  VAP protocol (if indicated): Yes DVT prophylaxis: SCD heparin - IVC filter in place. Consider starting full dose AC on 4/24 and retrieving filter.  GI prophylaxis: PPI Glucose control:  SSI Yes Central venous access:  N/A Arterial line:  N/A Foley:  Yes, and it is still needed - Diuresis Mobility:  bed rest  PT consulted: Yes Code Status:  full code Prognosis: Guarded Last date of multidisciplinary goals of care discussion 4/12 with primary team  The patient is critically ill with multiple organ systems failure and requires high complexity decision making for assessment and support, frequent evaluation and titration of therapies, application of advanced monitoring technologies and extensive interpretation of multiple databases.  Independent Critical Care Time: 35 Minutes.   Mechele Collin, M.D. [facility] Pulmonary/Critical Care Medicine 10/14/2020 7:33 AM   Please see Amion for pager number to reach on-call Pulmonary and Critical Care Team.

## 2020-10-14 NOTE — Progress Notes (Signed)
STROKE TEAM PROGRESS NOTE   INTERVAL HISTORY RNs are at the bedside.  Patient lying in bed, on trach collar, eyes open, but lethargic, did not quite following commands today. RN told me that he stick out his tongue on command. Still has copious secretions from trach, needing frequent suctioning. Now on Rubinol bid. Tmax 100.6, otherwise, vital and labs are stable.    Vitals:   10/14/20 0700 10/14/20 0800 10/14/20 1000 10/14/20 1037  BP: 130/69 (!) 158/67 131/66 131/66  Pulse: 73 76 78   Resp: 20 (!) 21 (!) 37   Temp:  99.4 F (37.4 C)    TempSrc:  Axillary    SpO2: 98% 97% 96%   Weight:      Height:       CBC:  Recent Labs  Lab 10/13/20 0312 10/14/20 0157  WBC 8.5 8.7  HGB 9.1* 10.4*  HCT 28.0* 32.7*  MCV 98.2 97.6  PLT 209 217   Basic Metabolic Panel:  Recent Labs  Lab 10/13/20 0312 10/14/20 0157  NA 135 139  K 4.1 4.5  CL 105 103  CO2 25 27  GLUCOSE 154* 156*  BUN 24* 25*  CREATININE 0.77 0.79  CALCIUM 8.2* 8.5*    IMAGING  CT head 4/6 1. Ventricle size shows mild interval enlargement since yesterday. No significant hydrocephalus. Intraventricular hemorrhage stable 2. Right frontal ventricular drain unchanged. Hemorrhage surrounding the drain unchanged. 3. Subarachnoid hemorrhage unchanged. Hemorrhage in the left tectum unchanged. No new hemorrhage identified.  CT head 4/4 1. Interval decrease in intraventricular hemorrhage with decreased size of the lateral ventricles as compared to prior, right greater than left. Right parietal approach ventriculostomy in stable position with tip near the septum pellucidum. 2. Stable small volume subarachnoid and intraparenchymal hemorrhage along the catheter tract. 3. Stable 1.9 cm hemorrhage at the left dorsal pons. 4. No other new acute intracranial abnormality. 09/29/2020  CT head 4/3 IMPRESSION: 1. New intraventricular hemorrhage in the third and lateral ventricles. Fourth ventricular and midbrain hemorrhage  is unchanged. 2. New right frontal EVD which traverses the right lateral ventricle. Blood clot encompasses the lower catheter and ventriculomegaly is similar to prior. 3. Small cortical infarcts are newly seen along the superior left frontal convexity.  09/26/2020 IR Angio IMPRESSION: 1. No evidence of an AVM, dural arteriovenous fistula, aneurysm or other vascular abnormality to explain patient's brainstem hemorrhage.  09/26/2020 CT head:  IMPRESSION: 1. Unchanged left brainstem hemorrhage extending into the fourth ventricle. 2. Worsening hydrocephalus.  09/25/2020 MRI brain w/wo, MR Venogram IMPRESSION: 1. Acute hemorrhage centered within the left dorsal midbrain extending into the dorsal pons and inferomedial thalamus with intraventricular extension. No abnormal enhancement to suggest underlying lesion. 2. Compression of the cerebral aqueduct with possible mild obstructive hydrocephalus. 3. No evidence of dural sinus thrombosis. Attenuation of the left basal vein in the quadrigeminal cistern without definite occlusion.  09/25/2020 CT Angio head/neck IMPRESSION: 1. No vascular malformation, anomaly or aneurysm. 2. No emergent large vessel occlusion or high-grade stenosis of the intracranial arteries. 3. Unchanged size of dorsal left pontine hemorrhage with intraventricular extension.  09/25/2020 CT head code stroke IMPRESSION: Acute intraparenchymal hemorrhage in the dorsal left pons with subarachnoid extension into the cerebral aqueduct and fourth Ventricle.  EEG-LTM 4/5-4/6  This study showed periodic discharges with triphasic morphology at 1.5 to 2.5 Hz which is on the ictal-interictal continuum with low to intermediate potential for seizures.  Additionally there is evidence of moderate to severe diffuse encephalopathy, nonspecific etiology.  No  seizures were seen during the study  CT HEAD WO CONTRAST  Result Date: 10/11/2020 CLINICAL DATA:  65 year old male  with left dorsal midbrain hemorrhage, intraventricular extension presenting on 09/25/2020. Several superimposed small infarcts. Subsequent encounter. EXAM: CT HEAD WITHOUT CONTRAST TECHNIQUE: Contiguous axial images were obtained from the base of the skull through the vertex without intravenous contrast. COMPARISON:  Brain MRI 10/06/2020 and earlier. FINDINGS: Brain: Mildly decreased ventricle size since 10/06/2020. Stable small volume of intraventricular hemorrhage, mostly layering in the occipital horns. Small parenchymal hematoma along the course of the right EVD is stable since 10/06/2020 (series 3, images 27-31) with confluent regional edema. There is a small volume of right superior frontal gyrus subarachnoid hemorrhage associated and stable (image 32 today). Additional small volume subarachnoid hemorrhage over both convexities elsewhere. Decreasing hyperdense blood products at the left midbrain and thalamus since 10/06/2020. Continued heterogeneous edema and/or developing encephalomalacia at those sites. Decreased conspicuity of the left cerebellar PICA infarct from earlier this month. Small foci of left frontal or restricted diffusion recently on MRI are largely occult by CT. No new major vascular territory infarct identified. No midline shift.  Normal basilar cisterns. Vascular: Mild Calcified atherosclerosis at the skull base. Skull: No acute osseous abnormality identified. Stable burr hole from the previous right superior approach EVD. Sinuses/Orbits: Increased bilateral middle ear and mastoid opacification. Stable left nasoenteric tube. Paranasal sinus aeration remains satisfactory. Other: Sequelae of the right superior approach EVD. Stable orbit and scalp soft tissues. IMPRESSION: 1. Decreased, improved ventricle size since 10/06/2020 with stable small volume of IVH. 2. Hematoma and edema along the right superior approach EVD tract is stable, including a small volume of bilateral SAH. 3. Expected  evolution of the left midbrain/thalamic hemorrhage and edema. 4. Small additional scattered subacute infarcts remain largely occult by CT. 5. No new intracranial abnormality identified. Electronically Signed   By: Odessa Fleming M.D.   On: 10/11/2020 04:43   EEG adult  Result Date: 10/13/2020 [name], MD     10/13/2020 12:24 PM Patient Name: Walter [name] MRN: [id number] Epilepsy Attending: [name] Referring Physician/Provider: Dr. Marvel Plan Date: 10/13/2020 Duration: 23.47 minutes Patient history: 29 old male with left upper extremity shaking. EEG done for seizures. Level of alertness: Awake AEDs during EEG study: None Technical aspects: This EEG study was done with scalp electrodes positioned according to the 10-20 International system of electrode placement. Electrical activity was acquired at a sampling rate of 500Hz  and reviewed with a high frequency filter of 70Hz  and a low frequency filter of 1Hz . EEG data were recorded continuously and digitally stored. Description: No clear posterior dominant rhythm was seen.  EEG showed continuous generalized 6 to 8 Hz theta-alpha activity as well as intermittent generalized sharply contoured 2 to 3 Hz delta slowing.  Hyperventilation and photic stimulation were not performed.   ABNORMALITY -Continuous slow, generalized IMPRESSION: This study is suggestive of moderate diffuse encephalopathy, nonspecific etiology. No seizures or definite epileptiform discharges were seen throughout the recording. Priyanka     PHYSICAL EXAM  Temp:  [98.6 F (37 C)-100.6 F (38.1 C)] 99.4 F (37.4 C) (04/16 0800) Pulse Rate:  [71-105] 78 (04/16 1000) Resp:  [20-37] 37 (04/16 1000) BP: (119-164)/(63-115) 131/66 (04/16 1037) SpO2:  [96 %-100 %] 96 % (04/16 1000) FiO2 (%):  [28 %-35 %] 28 % (04/16 0825)  General - Well nourished, well developed, on trach collar.  Ophthalmologic - fundi not visualized due to noncooperation.  Cardiovascular -  Regular  rate and rhythm, not in afib.  Neuro - on trach collar, eyes spontaneously open, but not following commands today. With eye opening, eyes in mid position, not consistently blinking to visual threat, doll's eyes present, intermittent tracking to voice, PERRL. Corneal reflex, gag and cough present. Right facial droop seen when pt grimace.  Tongue protrusion not cooperative. On pain stimulation, slight withdraw movement in all extremities, seems symmetrical. DTR diminished and no babinski. Sensation, coordination not cooperative and gait not tested.   ASSESSMENT/PLAN Walter [name] is a 65 y.o. male with history of atrial flutter newly diagnosed with atrial fibrillation in February 2022 not on anticoagulation, follows with Dr. Johney Frame in cardiology.  He presented with right-sided weakness, slurred speech, headache, and vomiting.Last known well when he went to bed at 10 PM on 09/24/2020 and woke up at around 2 AM on the day of admission with slurred speech.  EMS was called.He was very drowsy upon their assessment and notable right side weakeness.  He had worsening mental status, GCS 5, so was emergently intubated in ED for airway protection.  His MRI brain showed acute hemorrhage at left midbrain extending into pons with IVH associated with obstructive hydrocephalus.    ICH - Left Dorsalmidbrain ICHextending into dorsal pons and inferior thalamus with mild mass-effect on third ventricle and hydrocephalus,source unknown, suspect occult cavernoma  Code Stroke CT head-Acute IPH in the dorsal left pons with subarachnoid extension into the cerebral aqueduct and fourth ventricle.  CTA head & neck-No vascular malformation, anomaly or aneurysm. No emergent large vessel occlusion or high-grade stenosis. Unchanged size of dorsal left pontine hemorrhage with intraventricular extension.  MRI& MR Venogram-Acute hemorrhage centered within the left dorsal midbrain extending into the dorsal pons and  inferomedial thalamus with intraventricular extension. Compression of the cerebral aqueduct with possible mild obstructive hydrocephalus. No evidence of dural sinus thrombosis.   CT Head W/O Contrast 3/29 0250 -Unchanged left brainstem hemorrhage extending into the fourth ventricle. Worsening hydrocephalus.  Diagnostic Cerebral Angiogram - No evidence of an aneurysm, AVM, dural AV fistula or other vascular abnormality to explain patient's known intraparenchymal hemorrhage.  MRI brain 4/8 -stable IVH, midbrain ICH and right frontal EVD tract hemorrhage.  Ventricular size stable.  2-3 punctate infarcts left frontal and parietal lobes, corresponding to small acute/subacute infarcts.  CT head 4/13 stable IVH, ICH, decreased ventricle size  2D Echo- EF:55-60%. No wall motion abnormalities.  LDL- 88  HgbA1c5.7  VTE prophylaxis -subcu heparin  No antithromboticprior to admission, now on no antithrombotics due to ICH. Per Dr. Pearlean Brownie, consider anticoagulation in 2 weeks given afib and b/l DVTs.  Therapy recommendations:CIR  Disposition:Pending  Obstructive hydrocephalus   EVD placed on 3/30   CT 4/1 showed clot in right lateral ventricle around catheter.   S/p 1mg  (1cc) alteplase placed in EVD 09/29/20 -> drain well ->ICP measuring 4-11 with a good waveform  CT head (4/3) resolution of most of the IVH and improved hydrocephalus.  EVD stopped working 4/2 overnight. 0.5mg  alteplase placed in EVD 10/01/2020 -> Post alteplase CT head new hemorrhage along the EVD tract an in the right lateral ventricle.  CT repeat 4/4 - decreased IVH and decompressed right lateral ventricle  CT repeat 4/5 -mild interval enlargement of ventricular size, no significant hydrocephalus.  IVH stable.  HCT 4/7 stable, with no appreciable change in overall ventricular size  HCT 4/13 decreased ventricular size  4/16 will remove scalp staples.   Acute Respiratory Failure  Required intubation on 4/4  due to  worsening MS and hypoxia  S/p trach 10/04/20  Off sedation  On trach collar with copious secretions  On Rubinol bid   Bilateral DVT  LE venous Doppler 4/10 with bilateral DVTs  Status post IVC filter  Not a candidate for anticoagulation at this time  Per Dr. Pearlean Brownie, consider anticoagulant in 2 weeks  Aspiration Pneumonia Community-acquired pneumonia with Moraxella Fever   Tmax 101.1->102.4->103.6...-> 100.4->afebrile->100.6   Off arctic sun  Sputum culture (3/30): Moraxella  Sputum culture 4/5: neg  Ceftriaxone  dc'ed 4/1, 4/1-4/8 Unasyn 3g q6hr  WBC 10.7->16.3->11.0->9.7->10.3->10.6->12.4...-> 9.8->8.5->8.7  CXR 4/7, 4/8 left basilar opacities  Blood culture 4/8 positive for Acinetobacter  Off cefepime and vanco  PICC removed  CCM following  SIRPIDs, resolved Severe encephalopathy, improving  Left UE intermittent jerking with stimulation  LTM EEG: GPEDs with triphasic wave, generalized SIRPIDs, although focal seizure is in DDx  S/p keppra 10/03/20 -> d/c concerning for angioedema -> on vimpat 10/04/20-> off vimpat 10/11/20  EEG 4/4 moderate to severe diffuse encephalopathy   Repeat spot EEG 4/15 moderate diffuse encephalopathy  Angioedema, resolved  Upper lip edema, much improved  Concerning for keppra allergy  Hypertension  Home meds:Diltiazem  Currently on Diltiazem 30 mg q6, tapering clonidine  BP goal Systolic < 160  Long-term BP goal normotensive  Aflutter and Afib  Not on [facility] before admission  S/p CTI ablation for A flutter  off ASA  Not AC candidate at this time.  Afib with RVR overnight off amiodarone  Per Dr. Pearlean Brownie, consider anticoagulation in 2 weeks  Hyperlipidemia  Home meds:none  LDL88, goal < 70  Addstatin once mental status further improved  Dysphagia  Secondary to hemorrhagic stroke  NPO  SLP on board  Tube feeds @ 71ml/hr and IVF@ 25  May require PEG  Other Stroke Risk Factors  Advanced Age  >/= 54  Snoring: sleep study denied by insurance prior to admission   Other Active Problems:  Hypokalemia  Hospital day #19  This patient is critically ill due to pontine ICH, IVH, hydrocephalus, sepsis, seizure and at significant risk of neurological worsening, death form septic shock, status epilepticus, hematoma expansion, obstructive hydrocephalus. This patient's care requires constant monitoring of vital signs, hemodynamics, respiratory and cardiac monitoring, review of multiple databases, neurological assessment, discussion with family, other specialists and medical decision making of high complexity. I spent 35 minutes of neurocritical care time in the care of this patient.   Marvel Plan, MD PhD Stroke Neurology 10/14/2020 10:55 AM

## 2020-10-15 DIAGNOSIS — J9601 Acute respiratory failure with hypoxia: Secondary | ICD-10-CM | POA: Diagnosis not present

## 2020-10-15 DIAGNOSIS — I629 Nontraumatic intracranial hemorrhage, unspecified: Secondary | ICD-10-CM | POA: Diagnosis not present

## 2020-10-15 DIAGNOSIS — I613 Nontraumatic intracerebral hemorrhage in brain stem: Secondary | ICD-10-CM | POA: Diagnosis not present

## 2020-10-15 LAB — BASIC METABOLIC PANEL
Anion gap: 8 (ref 5–15)
BUN: 27 mg/dL — ABNORMAL HIGH (ref 8–23)
CO2: 26 mmol/L (ref 22–32)
Calcium: 8.4 mg/dL — ABNORMAL LOW (ref 8.9–10.3)
Chloride: 101 mmol/L (ref 98–111)
Creatinine, Ser: 0.87 mg/dL (ref 0.61–1.24)
GFR, Estimated: >60 mL/min (ref >60)
Glucose, Bld: 153 mg/dL — ABNORMAL HIGH (ref 70–99)
Potassium: 4.4 mmol/L (ref 3.5–5.1)
Sodium: 135 mmol/L (ref 135–145)

## 2020-10-15 LAB — CBC
HCT: 32.9 % — ABNORMAL LOW (ref 39.0–52.0)
Hemoglobin: 10.7 g/dL — ABNORMAL LOW (ref 13.0–17.0)
MCH: 32.1 pg (ref 26.0–34.0)
MCHC: 32.5 g/dL (ref 30.0–36.0)
MCV: 98.8 fL (ref 80.0–100.0)
Platelets: 207 10*3/uL (ref 150–400)
RBC: 3.33 MIL/uL — ABNORMAL LOW (ref 4.22–5.81)
RDW: 14.8 % (ref 11.5–15.5)
WBC: 8.9 10*3/uL (ref 4.0–10.5)
nRBC: 0 % (ref 0.0–0.2)

## 2020-10-15 LAB — GLUCOSE, CAPILLARY
Glucose-Capillary: 152 mg/dL — ABNORMAL HIGH (ref 70–99)
Glucose-Capillary: 173 mg/dL — ABNORMAL HIGH (ref 70–99)
Glucose-Capillary: 162 mg/dL — ABNORMAL HIGH (ref 70–99)
Glucose-Capillary: 190 mg/dL — ABNORMAL HIGH (ref 70–99)
Glucose-Capillary: 133 mg/dL — ABNORMAL HIGH (ref 70–99)
Glucose-Capillary: 157 mg/dL — ABNORMAL HIGH (ref 70–99)

## 2020-10-15 MED ORDER — FUROSEMIDE 10 MG/ML IJ SOLN
40.0000 mg | Freq: Two times a day (BID) | INTRAMUSCULAR | Status: AC
  Administered 2020-10-15 (×2): 40 mg via INTRAVENOUS
  Filled 2020-10-15 (×2): qty 4

## 2020-10-15 MED ORDER — GUAIFENESIN-DM 100-10 MG/5ML PO SYRP
5.0000 mL | ORAL_SOLUTION | ORAL | Status: DC | PRN
  Administered 2020-10-15 – 2020-10-26 (×10): 5 mL
  Filled 2020-10-15 (×9): qty 5

## 2020-10-15 MED ORDER — AMANTADINE HCL 50 MG/5ML PO SOLN
200.0000 mg | Freq: Two times a day (BID) | ORAL | Status: DC
  Filled 2020-10-15 (×2): qty 20

## 2020-10-15 MED ORDER — AMANTADINE HCL 50 MG/5ML PO SOLN
200.0000 mg | Freq: Two times a day (BID) | ORAL | Status: DC
  Administered 2020-10-16 – 2020-10-19 (×7): 200 mg
  Filled 2020-10-15 (×9): qty 20

## 2020-10-15 NOTE — Progress Notes (Signed)
STROKE TEAM PROGRESS NOTE   INTERVAL HISTORY RN, daughter and son in law are at the bedside.  Patient lying in bed, on trach collar, eyes open, but lethargic, initially did not quite following commands, however, later during the encounter, he was able to follow RN's commands. Tmax 100.4, otherwise, vital and labs are stable.    Vitals:   10/15/20 1500 10/15/20 1550 10/15/20 1600 10/15/20 1700  BP: (!) 144/69  (!) 142/66 (!) 143/67  Pulse: 83  85 85  Resp: (!) 31  (!) 35 (!) 40  Temp:   99.9 F (37.7 C)   TempSrc:   Oral   SpO2: 95% 95% 95% 93%  Weight:      Height:       CBC:  Recent Labs  Lab 10/14/20 0157 10/15/20 0313  WBC 8.7 8.9  HGB 10.4* 10.7*  HCT 32.7* 32.9*  MCV 97.6 98.8  PLT 217 207   Basic Metabolic Panel:  Recent Labs  Lab 10/14/20 0157 10/15/20 0313  NA 139 135  K 4.5 4.4  CL 103 101  CO2 27 26  GLUCOSE 156* 153*  BUN 25* 27*  CREATININE 0.79 0.87  CALCIUM 8.5* 8.4*    IMAGING  CT head 4/6 1. Ventricle size shows mild interval enlargement since yesterday. No significant hydrocephalus. Intraventricular hemorrhage stable 2. Right frontal ventricular drain unchanged. Hemorrhage surrounding the drain unchanged. 3. Subarachnoid hemorrhage unchanged. Hemorrhage in the left tectum unchanged. No new hemorrhage identified.  CT head 4/4 1. Interval decrease in intraventricular hemorrhage with decreased size of the lateral ventricles as compared to prior, right greater than left. Right parietal approach ventriculostomy in stable position with tip near the septum pellucidum. 2. Stable small volume subarachnoid and intraparenchymal hemorrhage along the catheter tract. 3. Stable 1.9 cm hemorrhage at the left dorsal pons. 4. No other new acute intracranial abnormality. 09/29/2020  CT head 4/3 IMPRESSION: 1. New intraventricular hemorrhage in the third and lateral ventricles. Fourth ventricular and midbrain hemorrhage is unchanged. 2. New right  frontal EVD which traverses the right lateral ventricle. Blood clot encompasses the lower catheter and ventriculomegaly is similar to prior. 3. Small cortical infarcts are newly seen along the superior left frontal convexity.  09/26/2020 IR Angio IMPRESSION: 1. No evidence of an AVM, dural arteriovenous fistula, aneurysm or other vascular abnormality to explain patient's brainstem hemorrhage.  09/26/2020 CT head:  IMPRESSION: 1. Unchanged left brainstem hemorrhage extending into the fourth ventricle. 2. Worsening hydrocephalus.  09/25/2020 MRI brain w/wo, MR Venogram IMPRESSION: 1. Acute hemorrhage centered within the left dorsal midbrain extending into the dorsal pons and inferomedial thalamus with intraventricular extension. No abnormal enhancement to suggest underlying lesion. 2. Compression of the cerebral aqueduct with possible mild obstructive hydrocephalus. 3. No evidence of dural sinus thrombosis. Attenuation of the left basal vein in the quadrigeminal cistern without definite occlusion.  09/25/2020 CT Angio head/neck IMPRESSION: 1. No vascular malformation, anomaly or aneurysm. 2. No emergent large vessel occlusion or high-grade stenosis of the intracranial arteries. 3. Unchanged size of dorsal left pontine hemorrhage with intraventricular extension.  09/25/2020 CT head code stroke IMPRESSION: Acute intraparenchymal hemorrhage in the dorsal left pons with subarachnoid extension into the cerebral aqueduct and fourth Ventricle.  EEG-LTM 4/5-4/6  This study showed periodic discharges with triphasic morphology at 1.5 to 2.5 Hz which is on the ictal-interictal continuum with low to intermediate potential for seizures.  Additionally there is evidence of moderate to severe diffuse encephalopathy, nonspecific etiology.  No seizures were seen during  the study  EEG adult 10/13/2020 This study is suggestive of moderate diffuse encephalopathy, nonspecific etiology. No  seizures or definite epileptiform discharges were seen throughout the recording. Priyanka Annabelle Harman     PHYSICAL EXAM  Temp:  [98.5 F (36.9 C)-100.4 F (38 C)] 99.9 F (37.7 C) (04/17 1600) Pulse Rate:  [76-95] 85 (04/17 1700) Resp:  [18-40] 40 (04/17 1700) BP: (115-158)/(64-75) 143/67 (04/17 1700) SpO2:  [93 %-99 %] 93 % (04/17 1700) FiO2 (%):  [28 %-30 %] 28 % (04/17 1550) Weight:  [103 kg] 103 kg (04/17 0400)  General - Well nourished, well developed, on trach collar.  Ophthalmologic - fundi not visualized due to noncooperation.  Cardiovascular - Regular rate and rhythm, not in afib.  Neuro - on trach collar with frequent coughing and needing suctioning, eyes spontaneously open, but not following commands initially but later followed commands with tongue protrusion and wiggle toes. With eye opening, eyes in mid position, not consistently blinking to visual threat, doll's eyes present, intermittent tracking to voice, PERRL. Corneal reflex, gag and cough present. Right facial droop seen when pt grimace.  Tongue protrusion not cooperative. On pain stimulation, slight withdraw movement in all extremities, seems symmetrical. DTR diminished and no babinski. Sensation, coordination not cooperative and gait not tested.   ASSESSMENT/PLAN Mr. NILESH STEGALL is a 65 y.o. male with history of atrial flutter newly diagnosed with atrial fibrillation in February 2022 not on anticoagulation, follows with Dr. Johney Frame in cardiology.  He presented with right-sided weakness, slurred speech, headache, and vomiting.Last known well when he went to bed at 10 PM on 09/24/2020 and woke up at around 2 AM on the day of admission with slurred speech.  EMS was called.He was very drowsy upon their assessment and notable right side weakeness.  He had worsening mental status, GCS 5, so was emergently intubated in ED for airway protection.  His MRI brain showed acute hemorrhage at left midbrain extending into pons with  IVH associated with obstructive hydrocephalus.    ICH - Left Dorsalmidbrain ICHextending into dorsal pons and inferior thalamus with mild mass-effect on third ventricle and hydrocephalus,source unknown, suspect occult cavernoma  Code Stroke CT head-Acute IPH in the dorsal left pons with subarachnoid extension into the cerebral aqueduct and fourth ventricle.  CTA head & neck-No vascular malformation, anomaly or aneurysm. No emergent large vessel occlusion or high-grade stenosis. Unchanged size of dorsal left pontine hemorrhage with intraventricular extension.  MRI& MR Venogram-Acute hemorrhage centered within the left dorsal midbrain extending into the dorsal pons and inferomedial thalamus with intraventricular extension. Compression of the cerebral aqueduct with possible mild obstructive hydrocephalus. No evidence of dural sinus thrombosis.   CT Head W/O Contrast 3/29 0250 -Unchanged left brainstem hemorrhage extending into the fourth ventricle. Worsening hydrocephalus.  Diagnostic Cerebral Angiogram - No evidence of an aneurysm, AVM, dural AV fistula or other vascular abnormality to explain patient's known intraparenchymal hemorrhage.  MRI brain 4/8 -stable IVH, midbrain ICH and right frontal EVD tract hemorrhage.  Ventricular size stable.  2-3 punctate infarcts left frontal and parietal lobes, corresponding to small acute/subacute infarcts.  CT head 4/13 stable IVH, ICH, decreased ventricle size  2D Echo- EF:55-60%. No wall motion abnormalities.  LDL- 88  HgbA1c5.7  VTE prophylaxis - subcu heparin  No antithromboticprior to admission, now on no antithrombotics due to ICH. Will repeat CT next week to decide on the timing of AC.   Therapy recommendations:CIR  Disposition:Pending  Obstructive hydrocephalus   EVD placed on 3/30  CT 4/1 showed clot in right lateral ventricle around catheter.   S/p 1mg  (1cc) alteplase placed in EVD 09/29/20 -> drain well ->ICP  measuring 4-11 with a good waveform  CT head (4/3) resolution of most of the IVH and improved hydrocephalus.  EVD stopped working 4/2 overnight. 0.5mg  alteplase placed in EVD 10/01/2020 -> Post alteplase CT head new hemorrhage along the EVD tract an in the right lateral ventricle.  CT repeat 4/4 - decreased IVH and decompressed right lateral ventricle  CT repeat 4/5 -mild interval enlargement of ventricular size, no significant hydrocephalus.  IVH stable.  HCT 4/7 stable, with no appreciable change in overall ventricular size  HCT 4/13 decreased ventricular size  4/16 will remove scalp staples.   Acute Respiratory Failure  Required intubation on 4/4 due to worsening MS and hypoxia  S/p trach 10/04/20  Off sedation  On trach collar with copious secretions  On robitussin PRN   Bilateral DVT  LE venous Doppler 4/10 with bilateral DVTs  Status post IVC filter  Not a candidate for anticoagulation at this time  Will repeat CT next week to decide on the timing of AC.   Aspiration Pneumonia Community-acquired pneumonia with Moraxella Fever   Tmax 101.1->102.4->103.6...-> 100.4->afebrile->100.6->100.4   Off arctic sun  Sputum culture (3/30): Moraxella  Sputum culture 4/5: neg  WBC 10.7->16.3->11.0->9.7->10.3->10.6->12.4...-> 9.8->8.5->8.7->8.9  CXR 4/7, 4/8 left basilar opacities  Blood culture 4/8 positive for Acinetobacter  Off cefepime and vanco  PICC removed  CCM following  SIRPIDs, resolved Severe encephalopathy, improving  Left UE intermittent jerking with stimulation  LTM EEG: GPEDs with triphasic wave, generalized SIRPIDs, although focal seizure is in DDx  S/p keppra 10/03/20 -> d/c concerning for angioedema -> on vimpat 10/04/20-> off vimpat 10/11/20  EEG 4/4 moderate to severe diffuse encephalopathy   Repeat spot EEG 4/15 moderate diffuse encephalopathy  Angioedema, resolved  Upper lip edema, much improved  Concerning for keppra  allergy  Hypertension  Home meds:Diltiazem  Currently on Diltiazem 30 mg q6  off clonidine  BP goal Systolic < 160  Long-term BP goal normotensive  Aflutter and Afib  Not on [facility] before admission  S/p CTI ablation for A flutter  off ASA  Not AC candidate at this time.  Afib with RVR intermittent -> sinus now  Will repeat CT next week to decide on the timing of AC.   Hyperlipidemia  Home meds:none  LDL88, goal < 70  Consider to addstatin on discharge  Dysphagia  Secondary to hemorrhagic stroke  NPO  SLP on board  Tube feeds @ 62ml/hr and IVF@ 25  May require PEG  Other Stroke Risk Factors  Advanced Age >/= 58  Snoring: sleep study denied by insurance prior to admission   Other Active Problems:  Hypokalemia  Lethargic - resume amantadine   Hospital day #20  This patient is critically ill due to pontine ICH, IVH, hydrocephalus, sepsis, seizure and at significant risk of neurological worsening, death form recurrent ICH, obstructive hydrocephalus, septic shock, status epilepticus, heart failure. This patient's care requires constant monitoring of vital signs, hemodynamics, respiratory and cardiac monitoring, review of multiple databases, neurological assessment, discussion with family, other specialists and medical decision making of high complexity. I spent 45 minutes of neurocritical care time in the care of this patient. I had long discussion with daughter and son-in-law at bedside, updated pt current condition, treatment plan and potential prognosis, and answered all the questions.  They expressed understanding and appreciation.    76, MD  PhD Stroke Neurology 10/15/2020 5:53 PM

## 2020-10-15 NOTE — Progress Notes (Signed)
NAME:  Walter [name], MRN:  [id number], DOB:  [birth year], LOS: 20 ADMISSION DATE:  09/25/2020, CONSULTATION DATE:  09/25/20 REFERRING MD:  Wilford Corner,  CHIEF COMPLAINT:  Headache, r side weakness, slurred speech  History of Present Illness:  Walter [name] is a 65 yo man with hx of afib not on anticoagulation admitted with right-sided weakness, hemorrhagic stroke. Emergently intubated in the ED for airway protection  Pertinent  Medical History  Aflutter/afib (not on ac) S/p Ablation 03/2020, hx of Cardioversion 2015 Hx retinal detachment Covid infection in July 2021 treated with monoclonal antibody  Significant Hospital Events: Including procedures, antibiotic start and stop dates in addition to other pertinent events   . 3/28- Admit, intubated.  Not a candidate for TPA due to intracranial hemorrhage.  Started 3% saline . 3/29- EVD placement for developing hydrocephalus. CTA of the head was unremarkable . 3/30- Extubated. . 4/1 respiratory culture grew Moraxella, on ceftriaxone . 4/4 reintubated because of hypoxia and unresponsiveness . 4/6 tracheostomy . 4/7 still encephalopathic  . 4/8 remains encephalopathic . 4/10 Doppler shows bilateral DVT. IVC filter placed.  . 4/11 Blood cultures positive for Acinetobacter  . 4/12 Fever no worse off Arctic Sun, off sedative infusions, tolerating trach collar.  . 4/13 Febrile @ 7am . 4/14 - 4/17 4/16 Afebrile Intermittently following simple commands and participated in PT, drowsy today  Interim History / Subjective:   Robinul discontinued for possible drowsiness. Secretions improved. Tolerated trach collar during day yesterday  Objective   Blood pressure (!) 141/65, pulse 81, temperature 100.1 F (37.8 C), temperature source Axillary, resp. rate (!) 29, height 6\' 2"  (1.88 m), weight 103 kg, SpO2 98 %.    Vent Mode: Stand-by;Other (Comment) FiO2 (%):  [28 %-30 %] 28 % Set Rate:  [20 bmp] 20 bmp Vt Set:  [650 mL] 650 mL PEEP:  [5 cmH20] 5  cmH20 Plateau Pressure:  [14 cmH20-18 cmH20] 14 cmH20   Intake/Output Summary (Last 24 hours) at 10/15/2020 1149 Last data filed at 10/15/2020 1128 Gross per 24 hour  Intake 2115.02 ml  Output 4375 ml  Net -2259.98 ml   Filed Weights   10/12/20 0500 10/13/20 0344 10/15/20 0400  Weight: 105.8 kg 106 kg 103 kg    Physical Exam: General: Chronically ill-appearing, lethargic HENT: [place], AT, OP clear, MMM Eyes: EOMI, PERRL no scleral icterus Respiratory: Clear to auscultation bilaterally.  No crackles, wheezing or rales Cardiovascular: RRR, -M/R/G, no JVD GI: BS+, soft, nontender Extremities:-Edema,-tenderness Neuro: Drowsy, does not follow commands, +cough, +gag  Labs/imaging that I havepersonally reviewed  (right click and "Reselect all SmartList Selections" daily)   Hg 10.7 stable BUN/Cr 27/0.87 stable  CT 4/13 shows no worsening hydrocephalus, improving hemorrhage Cultures - Acinetobacter in blood with negative follow-up cultures on 4/8  Resolved Hospital Problem list   AKI  Hypernatremia Moraxella catarrhalis pneumonia s/p antibiotics Thrombocytopenia Assessment & Plan:   Acute midbrain/dorsal pons/ thalamic IPH  Acute obstructive hydrocephalus s/p EVD  Acute encephalopathy secondary IPH, delirium, sedation -Imaging and management per primary team -Hold on anticoagulation -Minimize sedating drugs.  -PRN oxycodone for pain  Acute respiratory failure s/p tracheostomy due to failure to wean Moraxella catarrhalis pneumonia s/p antibiotics -Continue trach collar as tolerated -Full vent support at night -Diurese BID today -Scheduled robinul and PRN robitussin -VAP  Fever of unknown origin: Central vs Acinetobacter bacteremia Unclear cause of fever. S/p 7 day course of Cefepime. No fever for >72 hours off cooling blanket and IV sedation. Stopped bromocriptine and  buspirone.  -Continue acetaminophen PRN for fever  Bilateral DVT's  -IVC filter until 4/24 - then  should be able to transition to anticoagulation and remove filter.   Atrial fibrillation -Telemetry  Urinary retention -Follow in place  Best Practice (right click and "Reselect all SmartList Selections" daily)   Diet:  Tube Feed  on fiber for diarrhea  Pain/Anxiety/Delirium protocol (if indicated): RASS goal -1. Convert to enteral agents.  VAP protocol (if indicated): Yes DVT prophylaxis: SCD heparin - IVC filter in place. Consider starting full dose AC on 4/24 and retrieving filter.  GI prophylaxis: PPI Glucose control:  SSI Yes Central venous access:  N/A Arterial line:  N/A Foley:  Yes, and it is still needed - Diuresis Mobility:  bed rest  PT consulted: Yes Code Status:  full code Prognosis: Guarded Last date of multidisciplinary goals of care discussion 4/12 with primary team  The patient is critically ill with multiple organ systems failure and requires high complexity decision making for assessment and support, frequent evaluation and titration of therapies, application of advanced monitoring technologies and extensive interpretation of multiple databases.  Independent Critical Care Time: 35 Minutes.   Mechele Collin, M.D. [facility] Pulmonary/Critical Care Medicine 10/15/2020 11:50 AM   Please see Amion for pager number to reach on-call Pulmonary and Critical Care Team.

## 2020-10-16 DIAGNOSIS — I629 Nontraumatic intracranial hemorrhage, unspecified: Secondary | ICD-10-CM | POA: Diagnosis not present

## 2020-10-16 DIAGNOSIS — I613 Nontraumatic intracerebral hemorrhage in brain stem: Secondary | ICD-10-CM | POA: Diagnosis not present

## 2020-10-16 DIAGNOSIS — I615 Nontraumatic intracerebral hemorrhage, intraventricular: Secondary | ICD-10-CM | POA: Diagnosis not present

## 2020-10-16 DIAGNOSIS — J9602 Acute respiratory failure with hypercapnia: Secondary | ICD-10-CM | POA: Diagnosis not present

## 2020-10-16 DIAGNOSIS — G9340 Encephalopathy, unspecified: Secondary | ICD-10-CM | POA: Diagnosis not present

## 2020-10-16 LAB — GLUCOSE, CAPILLARY
Glucose-Capillary: 170 mg/dL — ABNORMAL HIGH (ref 70–99)
Glucose-Capillary: 171 mg/dL — ABNORMAL HIGH (ref 70–99)
Glucose-Capillary: 181 mg/dL — ABNORMAL HIGH (ref 70–99)
Glucose-Capillary: 184 mg/dL — ABNORMAL HIGH (ref 70–99)
Glucose-Capillary: 189 mg/dL — ABNORMAL HIGH (ref 70–99)
Glucose-Capillary: 195 mg/dL — ABNORMAL HIGH (ref 70–99)

## 2020-10-16 LAB — CBC
HCT: 34.1 % — ABNORMAL LOW (ref 39.0–52.0)
Hemoglobin: 10.9 g/dL — ABNORMAL LOW (ref 13.0–17.0)
MCH: 31.7 pg (ref 26.0–34.0)
MCHC: 32 g/dL (ref 30.0–36.0)
MCV: 99.1 fL (ref 80.0–100.0)
Platelets: 201 10*3/uL (ref 150–400)
RBC: 3.44 MIL/uL — ABNORMAL LOW (ref 4.22–5.81)
RDW: 14.9 % (ref 11.5–15.5)
WBC: 10.6 10*3/uL — ABNORMAL HIGH (ref 4.0–10.5)
nRBC: 0 % (ref 0.0–0.2)

## 2020-10-16 LAB — MAGNESIUM: Magnesium: 2.5 mg/dL — ABNORMAL HIGH (ref 1.7–2.4)

## 2020-10-16 LAB — BASIC METABOLIC PANEL
Anion gap: 8 (ref 5–15)
BUN: 28 mg/dL — ABNORMAL HIGH (ref 8–23)
CO2: 28 mmol/L (ref 22–32)
Calcium: 8.6 mg/dL — ABNORMAL LOW (ref 8.9–10.3)
Chloride: 101 mmol/L (ref 98–111)
Creatinine, Ser: 0.95 mg/dL (ref 0.61–1.24)
GFR, Estimated: >60 mL/min (ref >60)
Glucose, Bld: 186 mg/dL — ABNORMAL HIGH (ref 70–99)
Potassium: 4.2 mmol/L (ref 3.5–5.1)
Sodium: 137 mmol/L (ref 135–145)

## 2020-10-16 MED ORDER — FUROSEMIDE 10 MG/ML IJ SOLN
40.0000 mg | Freq: Every day | INTRAMUSCULAR | Status: AC
  Administered 2020-10-16 – 2020-10-17 (×2): 40 mg via INTRAVENOUS
  Filled 2020-10-16 (×2): qty 4

## 2020-10-16 MED ORDER — SODIUM CHLORIDE 0.9 % IV SOLN
3.0000 g | Freq: Four times a day (QID) | INTRAVENOUS | Status: DC
  Administered 2020-10-16 – 2020-10-18 (×8): 3 g via INTRAVENOUS
  Filled 2020-10-16: qty 3
  Filled 2020-10-16 (×2): qty 8
  Filled 2020-10-16 (×7): qty 3

## 2020-10-16 NOTE — Progress Notes (Signed)
NAME:  Walter [name], MRN:  [id number], DOB:  May 10, 1956, LOS: 21 ADMISSION DATE:  09/25/2020, CONSULTATION DATE:  09/25/20 REFERRING MD:  Wilford Corner,  CHIEF COMPLAINT:  Headache, r side weakness, slurred speech  History of Present Illness:  Walter [name] is a 65 yo man with hx of afib not on anticoagulation admitted with right-sided weakness, hemorrhagic stroke. Emergently intubated in the ED for airway protection  Pertinent  Medical History  Aflutter/afib (not on ac) S/p Ablation 03/2020, hx of Cardioversion 2015 Hx retinal detachment Covid infection in July 2021 treated with monoclonal antibody  Significant Hospital Events: Including procedures, antibiotic start and stop dates in addition to other pertinent events   . 3/28- Admit, intubated.  Not a candidate for TPA due to intracranial hemorrhage.  Started 3% saline . 3/29- EVD placement for developing hydrocephalus. CTA of the head was unremarkable . 3/30- Extubated. . 4/1 respiratory culture grew Moraxella, on ceftriaxone . 4/4 reintubated because of hypoxia and unresponsiveness . 4/6 tracheostomy . 4/7 still encephalopathic  . 4/8 remains encephalopathic . 4/10 Doppler shows bilateral DVT. IVC filter placed.  . 4/11 Blood cultures positive for Acinetobacter  . 4/12 Fever no worse off Arctic Sun, off sedative infusions, tolerating trach collar.  . 4/13 Febrile @ 7am . 4/14 - 4/17 4/16 Afebrile Intermittently following simple commands and participated in PT, drowsy today . 4/18 febrile. Starting unasyn, sending trach asp  Interim History / Subjective:  Febrile again this morning BMP pending. CBC 10.6   I put on PSV, tolerating well   Objective   Blood pressure (!) 157/76, pulse 94, temperature (!) 101.5 F (38.6 C), temperature source Oral, resp. rate 20, height 6\' 2"  (1.88 m), weight 100.1 kg, SpO2 96 %.    Vent Mode: PRVC FiO2 (%):  [28 %-30 %] 30 % Set Rate:  [20 bmp] 20 bmp Vt Set:  [650 mL] 650 mL PEEP:  [5 cmH20] 5  cmH20 Plateau Pressure:  [15 cmH20-16 cmH20] 16 cmH20   Intake/Output Summary (Last 24 hours) at 10/16/2020 0803 Last data filed at 10/16/2020 0500 Gross per 24 hour  Intake 1518.89 ml  Output 2625 ml  Net -1106.11 ml   Filed Weights   10/13/20 0344 10/15/20 0400 10/16/20 0400  Weight: 106 kg 103 kg 100.1 kg    Physical Exam: General: Chronically ill and critically ill appearing older adult M, reclined in bed trach/vent NAD  HENT: NCAT. Pink tacky mm. Trach secure tan, thick secretions Respiratory: Symmetrical chest expansion, even and unlabored  Cardiovascular: rrr s1s2 no rgm  GI: Soft round ndnt  Extremities: BLE pitting edema. No cyanosis or clubbing. No acute joint deformity  Neuro: Drowsy, followed intermittent command L foot, made eye contact.   Labs/imaging that I havepersonally reviewed  (right click and "Reselect all SmartList Selections" daily)   4/8 BCx acinetobacter x1 4/18 trach asp>>  Resolved Hospital Problem list   AKI  Hypernatremia Moraxella catarrhalis pneumonia s/p antibiotics Thrombocytopenia  Assessment & Plan:   Acute midbrain/dorsal pons/thalamic IPH Acute obstructive hydrocephalus s/p EVD  Acute encephalopathy secondary IPH, delirium, sedation  P -Imaging and management per primary team -Hold on anticoagulation -Minimize sedating drugs  -PRN oxycodone for pain  Acute hypoxic respiratory failure s/p tracheostomy  Moraxella catarrhalis PNA (has been tx)  -having some incr tracheal secretions 4/18, sounds wet  P -Wean to Trach collar, ATC as tolerated -routine trach care  -diurese 4/18  -send trach aspirate 4/18, start unasyn   Fever: central vs infectious  -  had a 7d course of cefepime for acinetobacter -was afebrile for 72 hrs off cooling blanket and IV sedation. Stopped bromocriptine and buspirone.  P -sending trach asp 4/18, starting empiric unasyn (would cover acinetobacter, possible resp sources) -Continue acetaminophen PRN for  fever  DVT, bilateral -IVC filter until 4/24 - then should be able to transition to anticoagulation and remove filter.   A fib - sinus 4/18  -Telemetry, q6hr dilt   Urinary retention  -foley  -urecholine   Best Practice (right click and "Reselect all SmartList Selections" daily)   Diet:  Tube Feed  on fiber for diarrhea  Pain/Anxiety/Delirium protocol (if indicated): RASS goal -1. Convert to enteral agents.  VAP protocol (if indicated): Yes DVT prophylaxis: SCD heparin - IVC filter in place. Consider starting full dose AC on 4/24 and retrieving filter.   GI prophylaxis: PPI Glucose control:  SSI Yes Central venous access:  N/A Arterial line:  N/A Foley:  Yes, and it is still needed - Diuresis Mobility:  bed rest  PT consulted: Yes Code Status:  full code Prognosis: Guarded Last date of multidisciplinary goals of care discussion 4/12 with primary team   CRITICAL CARE Performed by: Lanier Clam   Total critical care time: 39 minutes  Critical care time was exclusive of separately billable procedures and treating other patients.  Critical care was necessary to treat or prevent imminent or life-threatening deterioration.  Critical care was time spent personally by me on the following activities: development of treatment plan with patient and/or surrogate as well as nursing, discussions with consultants, evaluation of patient's response to treatment, examination of patient, obtaining history from patient or surrogate, ordering and performing treatments and interventions, ordering and review of laboratory studies, ordering and review of radiographic studies, pulse oximetry and re-evaluation of patient's condition.   [name] MSN, AGACNP-BC [place] Pulmonary/Critical Care Medicine [phone] If no answer, [phone] 10/16/2020, 8:04 AM

## 2020-10-16 NOTE — Progress Notes (Signed)
STROKE TEAM PROGRESS NOTE   INTERVAL HISTORY Wife and daughter are at the bedside.  Patient lying in bed, on trach collar, eyes closed but able to open with repetitive stimulation. Able to follow commands with eye closure and tongue protrusion. However did not follow other commands. He had spiking fever overnight, WBC mildly elevated too, still has copious secretions and now back on vent for helping of tiredness.   Vitals:   10/16/20 1100 10/16/20 1128 10/16/20 1154 10/16/20 1200  BP: (!) 165/77   128/73  Pulse: 91   90  Resp: 15   20  Temp:   (!) 100.5 F (38.1 C)   TempSrc:   Axillary   SpO2: 97% 95%  95%  Weight:      Height:       CBC:  Recent Labs  Lab 10/15/20 0313 10/16/20 0815  WBC 8.9 10.6*  HGB 10.7* 10.9*  HCT 32.9* 34.1*  MCV 98.8 99.1  PLT 207 201   Basic Metabolic Panel:  Recent Labs  Lab 10/15/20 0313 10/16/20 0815  NA 135 137  K 4.4 4.2  CL 101 101  CO2 26 28  GLUCOSE 153* 186*  BUN 27* 28*  CREATININE 0.87 0.95  CALCIUM 8.4* 8.6*  MG  --  2.5*    IMAGING  CT head 4/6 1. Ventricle size shows mild interval enlargement since yesterday. No significant hydrocephalus. Intraventricular hemorrhage stable 2. Right frontal ventricular drain unchanged. Hemorrhage surrounding the drain unchanged. 3. Subarachnoid hemorrhage unchanged. Hemorrhage in the left tectum unchanged. No new hemorrhage identified.  CT head 4/4 1. Interval decrease in intraventricular hemorrhage with decreased size of the lateral ventricles as compared to prior, right greater than left. Right parietal approach ventriculostomy in stable position with tip near the septum pellucidum. 2. Stable small volume subarachnoid and intraparenchymal hemorrhage along the catheter tract. 3. Stable 1.9 cm hemorrhage at the left dorsal pons. 4. No other new acute intracranial abnormality. 09/29/2020  CT head 4/3 IMPRESSION: 1. New intraventricular hemorrhage in the third and  lateral ventricles. Fourth ventricular and midbrain hemorrhage is unchanged. 2. New right frontal EVD which traverses the right lateral ventricle. Blood clot encompasses the lower catheter and ventriculomegaly is similar to prior. 3. Small cortical infarcts are newly seen along the superior left frontal convexity.  09/26/2020 IR Angio IMPRESSION: 1. No evidence of an AVM, dural arteriovenous fistula, aneurysm or other vascular abnormality to explain patient's brainstem hemorrhage.  09/26/2020 CT head:  IMPRESSION: 1. Unchanged left brainstem hemorrhage extending into the fourth ventricle. 2. Worsening hydrocephalus.  09/25/2020 MRI brain w/wo, MR Venogram IMPRESSION: 1. Acute hemorrhage centered within the left dorsal midbrain extending into the dorsal pons and inferomedial thalamus with intraventricular extension. No abnormal enhancement to suggest underlying lesion. 2. Compression of the cerebral aqueduct with possible mild obstructive hydrocephalus. 3. No evidence of dural sinus thrombosis. Attenuation of the left basal vein in the quadrigeminal cistern without definite occlusion.  09/25/2020 CT Angio head/neck IMPRESSION: 1. No vascular malformation, anomaly or aneurysm. 2. No emergent large vessel occlusion or high-grade stenosis of the intracranial arteries. 3. Unchanged size of dorsal left pontine hemorrhage with intraventricular extension.  09/25/2020 CT head code stroke IMPRESSION: Acute intraparenchymal hemorrhage in the dorsal left pons with subarachnoid extension into the cerebral aqueduct and fourth Ventricle.  EEG-LTM 4/5-4/6  This study showed periodic discharges with triphasic morphology at 1.5 to 2.5 Hz which is on the ictal-interictal continuum with low to intermediate potential for seizures.  Additionally there is  evidence of moderate to severe diffuse encephalopathy, nonspecific etiology.  No seizures were seen during the study  EEG  adult 10/13/2020 This study is suggestive of moderate diffuse encephalopathy, nonspecific etiology. No seizures or definite epileptiform discharges were seen throughout the recording. Walter [name]     PHYSICAL EXAM  Temp:  [99.9 F (37.7 C)-101.5 F (38.6 C)] 100.5 F (38.1 C) (04/18 1154) Pulse Rate:  [83-104] 90 (04/18 1200) Resp:  [15-41] 20 (04/18 1200) BP: (128-165)/(62-82) 128/73 (04/18 1200) SpO2:  [93 %-97 %] 95 % (04/18 1200) FiO2 (%):  [28 %-30 %] 30 % (04/18 1128) Weight:  [100.1 kg] 100.1 kg (04/18 0400)  General - Well nourished, well developed, back on vent.  Ophthalmologic - fundi not visualized due to noncooperation.  Cardiovascular - Regular rate and rhythm, not in afib.  Neuro - back on vent, eyes closed, but open with repetitive stimulation, able to follow limited commands of eye closure and tongue protrusion, but not following other commands. With eye opening, eyes in mid position, not consistently blinking to visual threat, doll's eyes present, intermittent tracking to voice, PERRL. Corneal reflex, gag and cough present.  Tongue protrusion midline. On pain stimulation, slight withdraw movement in all extremities. DTR diminished and no babinski. Sensation, coordination not cooperative and gait not tested.   ASSESSMENT/PLAN Walter [name] is a 65 y.o. male with history of atrial flutter newly diagnosed with atrial fibrillation in February 2022 not on anticoagulation, follows with Dr. Johney Frame in cardiology.  He presented with right-sided weakness, slurred speech, headache, and vomiting.Last known well when he went to bed at 10 PM on 09/24/2020 and woke up at around 2 AM on the day of admission with slurred speech.  EMS was called.He was very drowsy upon their assessment and notable right side weakeness.  He had worsening mental status, GCS 5, so was emergently intubated in ED for airway protection.  His MRI brain showed acute hemorrhage at left midbrain  extending into pons with IVH associated with obstructive hydrocephalus.    ICH - Left Dorsalmidbrain ICHextending into dorsal pons and inferior thalamus with mild mass-effect on third ventricle and hydrocephalus,source unknown, suspect occult cavernoma  Code Stroke CT head-Acute IPH in the dorsal left pons with subarachnoid extension into the cerebral aqueduct and fourth ventricle.  CTA head & neck-No vascular malformation, anomaly or aneurysm. No emergent large vessel occlusion or high-grade stenosis. Unchanged size of dorsal left pontine hemorrhage with intraventricular extension.  MRI& MR Venogram-Acute hemorrhage centered within the left dorsal midbrain extending into the dorsal pons and inferomedial thalamus with intraventricular extension. Compression of the cerebral aqueduct with possible mild obstructive hydrocephalus. No evidence of dural sinus thrombosis.   CT Head W/O Contrast 3/29 0250 -Unchanged left brainstem hemorrhage extending into the fourth ventricle. Worsening hydrocephalus.  Diagnostic Cerebral Angiogram - No evidence of an aneurysm, AVM, dural AV fistula or other vascular abnormality to explain patient's known intraparenchymal hemorrhage.  MRI brain 4/8 -stable IVH, midbrain ICH and right frontal EVD tract hemorrhage.  Ventricular size stable.  2-3 punctate infarcts left frontal and parietal lobes, corresponding to small acute/subacute infarcts.  CT head 4/13 stable IVH, ICH, decreased ventricle size  CT repeat pending  2D Echo- EF:55-60%. No wall motion abnormalities.  LDL- 88  HgbA1c5.7  VTE prophylaxis - subcu heparin  No antithromboticprior to admission, now on no antithrombotics due to ICH. Will repeat CT in am to decide on the timing of AC.   Therapy recommendations:CIR vs LTAC  Disposition:Pending  Obstructive hydrocephalus   EVD placed on 3/30   CT 4/1 showed clot in right lateral ventricle around catheter.   S/p 1mg  (1cc)  alteplase placed in EVD 09/29/20 -> drain well ->ICP measuring 4-11 with a good waveform  CT head (4/3) resolution of most of the IVH and improved hydrocephalus.  EVD stopped working 4/2 overnight. 0.5mg  alteplase placed in EVD 10/01/2020 -> Post alteplase CT head new hemorrhage along the EVD tract an in the right lateral ventricle.  CT repeat 4/4 - decreased IVH and decompressed right lateral ventricle  CT repeat 4/5 -mild interval enlargement of ventricular size, no significant hydrocephalus.  IVH stable.  HCT 4/7 stable, with no appreciable change in overall ventricular size  HCT 4/13 decreased ventricular size  HCT repeat in am  4/16 will remove scalp staples.   Acute Respiratory Failure  Required intubation on 4/4 due to worsening MS and hypoxia  S/p trach 10/04/20  Off sedation  On trach collar with copious secretions  On robitussin PRN   Bilateral DVT  LE venous Doppler 4/10 with bilateral DVTs  Status post IVC filter  Not a candidate for anticoagulation at this time  Will repeat CT in am to decide on the timing of AC.   Aspiration Pneumonia Community-acquired pneumonia with Moraxella Fever   Tmax 100.6->100.4->101.5  Off arctic sun  Sputum culture (3/30): Moraxella  Sputum culture 4/5: neg  WBC 9.8->8.5->8.7->8.9->10.6  CXR 4/7, 4/8 left basilar opacities  Blood culture 4/8 positive for Acinetobacter  Off cefepime and vanco -> unasyn   PICC removed  CCM following  SIRPIDs, resolved Severe encephalopathy, improving  Left UE intermittent jerking with stimulation  LTM EEG: GPEDs with triphasic wave, generalized SIRPIDs, although focal seizure is in DDx  S/p keppra 10/03/20 -> d/c concerning for angioedema -> on vimpat 10/04/20-> off vimpat 10/11/20  EEG 4/4 moderate to severe diffuse encephalopathy   Repeat spot EEG 4/15 moderate diffuse encephalopathy  Angioedema, resolved  Upper lip edema, much improved  Concerning for keppra  allergy  Hypertension  Home meds:Diltiazem  Currently on Diltiazem 30 mg q6  off clonidine  BP goal Systolic < 160  Long-term BP goal normotensive  Hx of Aflutter PAF  Not on [facility] before admission  S/p CTI ablation for A flutter  off ASA  Not AC candidate at this time.  Afib with RVR intermittent -> sinus now  Will repeat CT in am to decide on the timing of AC.   Hyperlipidemia  Home meds:none  LDL88, goal < 70  Consider to addstatin on discharge  Dysphagia  Secondary to hemorrhagic stroke  NPO  SLP on board  Tube feeds @ 65ml/hr and IVF@ 25  May require PEG, family in agreement, will discuss with CCM  Other Stroke Risk Factors  Advanced Age >/= 71  Snoring: sleep study denied by insurance prior to admission   Other Active Problems:  Hypokalemia  Lethargic - on amantadine   Hospital day #21  This patient is critically ill due to ICH, IVH, hydrocephalus, spesis, fever, s/p trach and at significant risk of neurological worsening, death form respiratory failure, obstructive hydrocephalus, sepsis, seizure. This patient's care requires constant monitoring of vital signs, hemodynamics, respiratory and cardiac monitoring, review of multiple databases, neurological assessment, discussion with family, other specialists and medical decision making of high complexity. I spent 45 minutes of neurocritical care time in the care of this patient. I had long discussion with wife and daughter at bedside, updated pt current condition,  treatment plan and potential prognosis, and answered all the questions. They expressed understanding and appreciation.   Marvel Plan, MD PhD Stroke Neurology 10/16/2020 12:33 PM

## 2020-10-16 NOTE — Progress Notes (Signed)
Physical Therapy Treatment Patient Details Name: Walter [name] MRN: [id number] DOB: 04/03/1956 Today's Date: 10/16/2020    History of Present Illness 65 yo male presenting 3/28 with R-sided weakness and slurred speech. Imaging revealed acute hemorrhage centered within the left dorsal midbrain extending into the dorsal pons and inferomedial thalamus with intraventricular extension. EVD placement for developing hydrocephalus on 3/29. Extubated 3/30. Pt with noted decrease in EVD drainage overnight 3/31, proximal and distal clots noted, flushed, repeat CT shows significant clot in R lateral ventricle. EVD with another episode of clotting overnight 4/2, intraventricular TpA administered. Reintubated 4/4. 4/6 tracheostomy. 4/10 Doppler shows bilateral DVT. IVC filter placed. PMH Afib, COVID infection (July 2021), and retinal detachment.    PT Comments    The pt presents with increased lethargy at the time of this session. Initially presenting with short moments of increased alertness, visually tracking, and turning his head to auditory stimuli. However, this was not sustained. He was able to follow commands (wiggle your toes) to R LE only, no other active movements noted this session. The pt was then cued to participate in a series of exercises for ROM to BLE and UE, but did not demo any voluntary activation, requiring PROM today. Will continue to progress with mobility and OOB transfers as tolerated. Continue to recommend therapies following d/c.    Follow Up Recommendations  SNF     Equipment Recommendations  Hospital bed (defer to post acute)    Recommendations for Other Services       Precautions / Restrictions Precautions Precautions: Fall Precaution Comments: flexiseal Required Braces or Orthoses: Other Brace Other Brace: BUE resting hand splints, see schedule Restrictions Weight Bearing Restrictions: No    Mobility  Bed Mobility Overal bed mobility: Needs Assistance Bed  Mobility: Supine to Sit     Supine to sit: Total assist     General bed mobility comments: totalA with bed egress to chair used this session due to limited alertness. Pt required total support of bed or assist to maintain upright position, head falling into cervical extension when not supported    Transfers                 General transfer comment: Defer due to arousal level      Modified Rankin (Stroke Patients Only) Modified Rankin (Stroke Patients Only) Pre-Morbid Rankin Score: No symptoms Modified Rankin: Severe disability        Cognition Arousal/Alertness: Lethargic (intermittently alert for short periods) Behavior During Therapy: Flat affect Overall Cognitive Status: Difficult to assess                                 General Comments: pt with moments of increased alertness, visually tracking, and turning his head to auditory stimuli. However, this was not sustained. following simple commands (wiggle your toes) to R LE only, no other active movements noted this session      Exercises General Exercises - Upper Extremity Shoulder Flexion: PROM;Both;10 reps;Supine Shoulder ABduction: PROM;Both;10 reps;Seated Elbow Flexion: PROM;Both;10 reps;Supine Elbow Extension: PROM;Both;10 reps;Supine Wrist Flexion: PROM;Both;10 reps;Supine Wrist Extension: PROM;Both;10 reps;Supine Digit Composite Flexion: PROM;Both;10 reps;Supine Composite Extension: PROM;Both;10 reps;Supine General Exercises - Lower Extremity Ankle Circles/Pumps: PROM;Both;15 reps;Seated Short Arc Quad: PROM;Both;10 reps;Seated Heel Slides: PROM;Both;10 reps;Supine Hip ABduction/ADduction: PROM;Both;10 reps;Supine    General Comments General comments (skin integrity, edema, etc.): noted edema in BUE and R ankle, RN alerted. pt in BUE splints  Pertinent Vitals/Pain Pain Assessment: Faces Faces Pain Scale: Hurts even more Pain Location: grimacing with both flat supine in bed and  with DF of bilateral ankles (could be related to DVT Pain Descriptors / Indicators: Grimacing (withdarwal of BLE) Pain Intervention(s): Limited activity within patient's tolerance;Repositioned           PT Goals (current goals can now be found in the care plan section) Acute Rehab PT Goals Patient Stated Goal: Unstated PT Goal Formulation: With patient Time For Goal Achievement: 10/30/20 Potential to Achieve Goals: Fair Progress towards PT goals: Not progressing toward goals - comment (lethargy)    Frequency    Min 3X/week      PT Plan Current plan remains appropriate       AM-PAC PT "6 Clicks" Mobility   Outcome Measure  Help needed turning from your back to your side while in a flat bed without using bedrails?: Total Help needed moving from lying on your back to sitting on the side of a flat bed without using bedrails?: Total Help needed moving to and from a bed to a chair (including a wheelchair)?: Total Help needed standing up from a chair using your arms (e.g., wheelchair or bedside chair)?: Total Help needed to walk in hospital room?: Total Help needed climbing 3-5 steps with a railing? : Total 6 Click Score: 6    End of Session Equipment Utilized During Treatment: Oxygen (vent trach) Activity Tolerance: Patient limited by lethargy Patient left: in bed;with call bell/phone within reach;with SCD's reapplied Nurse Communication: Mobility status PT Visit Diagnosis: Hemiplegia and hemiparesis;Other abnormalities of gait and mobility (R26.89) Hemiplegia - Right/Left: Right Hemiplegia - dominant/non-dominant: Dominant Hemiplegia - caused by: Nontraumatic intracerebral hemorrhage     Time: 2355-7322 PT Time Calculation (min) (ACUTE ONLY): 28 min  Charges:  $Therapeutic Exercise: 23-37 mins                     Deland Pretty, DPT   Acute Rehabilitation Department Pager #: [phone]   Gaetana Michaelis 10/16/2020, 3:02 PM

## 2020-10-16 NOTE — Progress Notes (Signed)
Pharmacy Antibiotic Note  Walter [name] is a 65 y.o. male admitted on 09/25/2020 with persistent fevers concerning for ?bacteremia vs. VAP.  Pharmacy has been consulted for Unasyn dosing.  Of note, patient recently finished a course of Unasyn followed by Cefepime for acinetobacter pneumonia. WBC up to 10.6. Tm 101.77F. SCr wnl   Plan: -Start Unasyn 3 gm IV Q 6 hours  -Monitor CBC, renal fx, and clinical progress -F/u trach aspirate results    Height: 6\' 2"  (188 cm) Weight: 100.1 kg (220 lb 10.9 oz) IBW/kg (Calculated) : 82.2  Temp (24hrs), Avg:100.4 F (38 C), Min:99.6 F (37.6 C), Max:101.5 F (38.6 C)  Recent Labs  Lab 10/12/20 0242 10/13/20 0312 10/14/20 0157 10/15/20 0313 10/16/20 0815  WBC 9.8 8.5 8.7 8.9 10.6*  CREATININE 0.79 0.77 0.79 0.87  --     Estimated Creatinine Clearance: 107 mL/min (by C-G formula based on SCr of 0.87 mg/dL).    Allergies  Allergen Reactions  . Keppra [Levetiracetam] Swelling    Patient experienced angioedema post inpatient keppra dose  . Latex Itching    Skin turns real red    CTX 4/1 >> 4/3 Unasyn 4/4 >> 4/8; 4/18 >>  Vanc 4/8 >> 4/9 Cefepime 4/8 >> 4/15  3/28 MRSA PCR >> neg 3/30 RCx (TA) >> moraxella catarrhalis +beta lactamase+ 4/5 TA >> final 4/8 BCx - Acinetobacter (R only to Zosyn; S to unasyn but bug was isolated on 5th day of unasyn course)  4/8 UCx - negative 4/8 TA - C.albicans 4/10 Cdiff - negative 4/18 TA >>   Thank you for allowing pharmacy to be a part of this patient's care.  5/18, PharmD., BCPS, BCCCP Clinical Pharmacist Please refer to [facility] for unit-specific pharmacist

## 2020-10-16 NOTE — Progress Notes (Signed)
SLP Cancellation Note  Patient Details Name: Walter [name] MRN: [id number] DOB: 02-Jun-1956   Cancelled treatment:       Reason Eval/Treat Not Completed: Patient not medically ready- unable to wean to TC today per discussion with RT.  Febrile; copious secretions; not appropriate for PMV trials. Will follow along for readiness.  Kenlea Woodell L. Samson Frederic, MA CCC/SLP Acute Rehabilitation Services Office number [phone] Pager [phone]    Blenda Mounts Laurice 10/16/2020, 11:38 AM

## 2020-10-16 NOTE — Progress Notes (Signed)
Nutrition Follow-up  DOCUMENTATION CODES:   Not applicable  INTERVENTION:   Continue Tube Feeding via Cortrak:  Vital 1.5 at 65 ml/h  Prosource TF 45 ml BID  Provides 2420 kcal, 127 gm protein, 1185 ml free water daily   NUTRITION DIAGNOSIS:   Inadequate oral intake related to inability to eat as evidenced by NPO status.  Being addressed via TF   GOAL:   Patient will meet greater than or equal to 90% of their needs  Progressing  MONITOR:   TF tolerance  REASON FOR ASSESSMENT:   Consult,Ventilator Enteral/tube feeding initiation and management  ASSESSMENT:   Pt with PMH of afib not on anticoagulation s/p ablation 9/21, s/p cardioversion 2015, retinal detachment s/p surgery, COVID s/p monoclonal antibody treatment 7/21, and CV admitted with right-sided weakness and ICH.  Pt discussed during ICU rounds and with RN.  Pt remains on vent support via trach; pt has been febrile with copious secretions and positive respiratory cultures. Per MD plan for PEG.   3/29 EVD placement  3/30 extubated; cortrak placed tip gastric  4/04 re-intubated 4/06 Trach placed 4/8 EVD removed   Labs: CBG's: 171-189 Meds: lasix, ss novolog, lantus, nutrisource, zofran     Diet Order:   Diet Order            Diet NPO time specified  Diet effective now                 EDUCATION NEEDS:   No education needs have been identified at this time  Skin:  Skin Assessment:  (MASD: rectum)  Last BM:  4/16  Height:   Ht Readings from Last 1 Encounters:  09/25/20 6\' 2"  (1.88 m)    Weight:   Wt Readings from Last 1 Encounters:  10/16/20 100.1 kg    Ideal Body Weight:  86.3 kg  BMI:  Body mass index is 28.33 kg/m.  Estimated Nutritional Needs:   Kcal:  2400-2600  Protein:  115-130 grams  Fluid:  >2 L/day   10/18/20., RD, LDN, CNSC See AMiON for contact information '

## 2020-10-17 ENCOUNTER — Inpatient Hospital Stay (HOSPITAL_COMMUNITY): Payer: Medicare Other

## 2020-10-17 DIAGNOSIS — R509 Fever, unspecified: Secondary | ICD-10-CM | POA: Diagnosis not present

## 2020-10-17 DIAGNOSIS — I82413 Acute embolism and thrombosis of femoral vein, bilateral: Secondary | ICD-10-CM

## 2020-10-17 DIAGNOSIS — J9602 Acute respiratory failure with hypercapnia: Secondary | ICD-10-CM | POA: Diagnosis not present

## 2020-10-17 DIAGNOSIS — I480 Paroxysmal atrial fibrillation: Secondary | ICD-10-CM | POA: Diagnosis not present

## 2020-10-17 DIAGNOSIS — J158 Pneumonia due to other specified bacteria: Secondary | ICD-10-CM

## 2020-10-17 DIAGNOSIS — I629 Nontraumatic intracranial hemorrhage, unspecified: Secondary | ICD-10-CM | POA: Diagnosis not present

## 2020-10-17 DIAGNOSIS — I613 Nontraumatic intracerebral hemorrhage in brain stem: Secondary | ICD-10-CM | POA: Diagnosis not present

## 2020-10-17 DIAGNOSIS — B9689 Other specified bacterial agents as the cause of diseases classified elsewhere: Secondary | ICD-10-CM

## 2020-10-17 DIAGNOSIS — G9340 Encephalopathy, unspecified: Secondary | ICD-10-CM | POA: Diagnosis not present

## 2020-10-17 DIAGNOSIS — I824Z3 Acute embolism and thrombosis of unspecified deep veins of distal lower extremity, bilateral: Secondary | ICD-10-CM

## 2020-10-17 DIAGNOSIS — Z8616 Personal history of COVID-19: Secondary | ICD-10-CM

## 2020-10-17 DIAGNOSIS — I2699 Other pulmonary embolism without acute cor pulmonale: Secondary | ICD-10-CM

## 2020-10-17 DIAGNOSIS — Z982 Presence of cerebrospinal fluid drainage device: Secondary | ICD-10-CM

## 2020-10-17 DIAGNOSIS — R7989 Other specified abnormal findings of blood chemistry: Secondary | ICD-10-CM

## 2020-10-17 DIAGNOSIS — R7303 Prediabetes: Secondary | ICD-10-CM | POA: Diagnosis not present

## 2020-10-17 LAB — CBC
HCT: 35.1 % — ABNORMAL LOW (ref 39.0–52.0)
Hemoglobin: 11 g/dL — ABNORMAL LOW (ref 13.0–17.0)
MCH: 31.4 pg (ref 26.0–34.0)
MCHC: 31.3 g/dL (ref 30.0–36.0)
MCV: 100.3 fL — ABNORMAL HIGH (ref 80.0–100.0)
Platelets: 148 10*3/uL — ABNORMAL LOW (ref 150–400)
RBC: 3.5 MIL/uL — ABNORMAL LOW (ref 4.22–5.81)
RDW: 15.1 % (ref 11.5–15.5)
WBC: 12.2 10*3/uL — ABNORMAL HIGH (ref 4.0–10.5)
nRBC: 0 % (ref 0.0–0.2)

## 2020-10-17 LAB — GLUCOSE, CAPILLARY
Glucose-Capillary: 149 mg/dL — ABNORMAL HIGH (ref 70–99)
Glucose-Capillary: 159 mg/dL — ABNORMAL HIGH (ref 70–99)
Glucose-Capillary: 179 mg/dL — ABNORMAL HIGH (ref 70–99)
Glucose-Capillary: 196 mg/dL — ABNORMAL HIGH (ref 70–99)
Glucose-Capillary: 198 mg/dL — ABNORMAL HIGH (ref 70–99)
Glucose-Capillary: 224 mg/dL — ABNORMAL HIGH (ref 70–99)

## 2020-10-17 LAB — BASIC METABOLIC PANEL
Anion gap: 10 (ref 5–15)
BUN: 42 mg/dL — ABNORMAL HIGH (ref 8–23)
CO2: 27 mmol/L (ref 22–32)
Calcium: 8.9 mg/dL (ref 8.9–10.3)
Chloride: 103 mmol/L (ref 98–111)
Creatinine, Ser: 1.14 mg/dL (ref 0.61–1.24)
GFR, Estimated: >60 mL/min (ref >60)
Glucose, Bld: 216 mg/dL — ABNORMAL HIGH (ref 70–99)
Potassium: 4.2 mmol/L (ref 3.5–5.1)
Sodium: 140 mmol/L (ref 135–145)

## 2020-10-17 LAB — HEPATIC FUNCTION PANEL
ALT: 126 U/L — ABNORMAL HIGH (ref 0–44)
AST: 56 U/L — ABNORMAL HIGH (ref 15–41)
Albumin: 2.8 g/dL — ABNORMAL LOW (ref 3.5–5.0)
Alkaline Phosphatase: 129 U/L — ABNORMAL HIGH (ref 38–126)
Bilirubin, Direct: 0.2 mg/dL (ref 0.0–0.2)
Indirect Bilirubin: 0.6 mg/dL (ref 0.3–0.9)
Total Bilirubin: 0.8 mg/dL (ref 0.3–1.2)
Total Protein: 6.6 g/dL (ref 6.5–8.1)

## 2020-10-17 LAB — HEPATITIS PANEL, ACUTE
HCV Ab: NONREACTIVE
Hep A IgM: NONREACTIVE
Hep B C IgM: NONREACTIVE
Hepatitis B Surface Ag: NONREACTIVE

## 2020-10-17 LAB — MAGNESIUM: Magnesium: 2.7 mg/dL — ABNORMAL HIGH (ref 1.7–2.4)

## 2020-10-17 LAB — HEPARIN LEVEL (UNFRACTIONATED): Heparin Unfractionated: <0.1 IU/mL — ABNORMAL LOW (ref 0.30–0.70)

## 2020-10-17 MED ORDER — IOHEXOL 9 MG/ML PO SOLN
ORAL | Status: AC
  Administered 2020-10-17: 500 mL
  Filled 2020-10-17: qty 500

## 2020-10-17 MED ORDER — INSULIN ASPART 100 UNIT/ML ~~LOC~~ SOLN
0.0000 [IU] | SUBCUTANEOUS | Status: DC
  Administered 2020-10-17: 4 [IU] via SUBCUTANEOUS
  Administered 2020-10-17: 3 [IU] via SUBCUTANEOUS
  Administered 2020-10-17 – 2020-10-18 (×5): 4 [IU] via SUBCUTANEOUS
  Administered 2020-10-18: 7 [IU] via SUBCUTANEOUS
  Administered 2020-10-18 – 2020-10-19 (×3): 4 [IU] via SUBCUTANEOUS
  Administered 2020-10-19: 3 [IU] via SUBCUTANEOUS
  Administered 2020-10-19: 4 [IU] via SUBCUTANEOUS
  Administered 2020-10-19 – 2020-10-20 (×6): 3 [IU] via SUBCUTANEOUS
  Administered 2020-10-21 (×2): 4 [IU] via SUBCUTANEOUS
  Administered 2020-10-21 – 2020-10-22 (×5): 3 [IU] via SUBCUTANEOUS
  Administered 2020-10-22: 4 [IU] via SUBCUTANEOUS
  Administered 2020-10-23 (×4): 3 [IU] via SUBCUTANEOUS
  Administered 2020-10-24: 4 [IU] via SUBCUTANEOUS
  Administered 2020-10-24 – 2020-10-26 (×5): 3 [IU] via SUBCUTANEOUS
  Administered 2020-10-27: 4 [IU] via SUBCUTANEOUS
  Administered 2020-10-28: 3 [IU] via SUBCUTANEOUS
  Administered 2020-10-28: 4 [IU] via SUBCUTANEOUS
  Administered 2020-10-28: 3 [IU] via SUBCUTANEOUS

## 2020-10-17 MED ORDER — IOHEXOL 300 MG/ML  SOLN
100.0000 mL | Freq: Once | INTRAMUSCULAR | Status: AC | PRN
  Administered 2020-10-17: 100 mL via INTRAVENOUS

## 2020-10-17 MED ORDER — HEPARIN (PORCINE) 25000 UT/250ML-% IV SOLN
2150.0000 [IU]/h | INTRAVENOUS | Status: AC
  Administered 2020-10-17: 1400 [IU]/h via INTRAVENOUS
  Administered 2020-10-18: 1800 [IU]/h via INTRAVENOUS
  Administered 2020-10-19 – 2020-10-20 (×2): 2150 [IU]/h via INTRAVENOUS
  Filled 2020-10-17 (×6): qty 250

## 2020-10-17 MED ORDER — INSULIN ASPART 100 UNIT/ML ~~LOC~~ SOLN
4.0000 [IU] | SUBCUTANEOUS | Status: DC
  Administered 2020-10-17 – 2020-10-20 (×14): 4 [IU] via SUBCUTANEOUS

## 2020-10-17 NOTE — Progress Notes (Signed)
ANTICOAGULATION CONSULT NOTE   Pharmacy Consult for heparin Indication: DVT  Allergies  Allergen Reactions  . Keppra [Levetiracetam] Swelling    Patient experienced angioedema post inpatient keppra dose  . Latex Itching    Skin turns real red    Patient Measurements: Height: 6\' 2"  (188 cm) Weight: 100.1 kg (220 lb 10.9 oz) IBW/kg (Calculated) : 82.2 Heparin Dosing Weight: 100.1 kg   Vital Signs: Temp: 100.4 F (38 C) (04/19 2000) Temp Source: Axillary (04/19 2000) BP: 119/73 (04/19 2100) Pulse Rate: 91 (04/19 2100)  Labs: Recent Labs    10/15/20 0313 10/16/20 0815 10/17/20 0642 10/17/20 1941  HGB 10.7* 10.9* 11.0*  --   HCT 32.9* 34.1* 35.1*  --   PLT 207 201 148*  --   HEPARINUNFRC  --   --   --  <0.10*  CREATININE 0.87 0.95 1.14  --     Estimated Creatinine Clearance: 81.7 mL/min (by C-G formula based on SCr of 1.14 mg/dL).    Assessment: 7 YOM who presented to the hospital with an intraparenchymal hemorrhage later found to have a DVT. Given his recent ICH, he received an IVC filter but now starting therapeutic anticoag per neurology with NO BOLUS  H/H low stable, Plt down. SCr wnl  PM heparin level < 0.10  Goal of Therapy:  Heparin level 0.3-0.5 units/ml Monitor platelets by anticoagulation protocol: Yes   Plan:  Increase heparin to 1400 units / hr Next heparin level in 8 hours  Thank you 76, PharmD  Please refer to [facility] for unit-specific pharmacist

## 2020-10-17 NOTE — Progress Notes (Signed)
Date and time results received: 10/17/20 1500  Test: CT chest Critical Value: Right > Left pulmonary emboli; no heart strain  Name of Provider Notified: J. Xu MD  Orders Received? Or Actions Taken?: Actions Taken: heparin gtt orders already placed this AM

## 2020-10-17 NOTE — Progress Notes (Signed)
ANTICOAGULATION CONSULT NOTE - Initial Consult  Pharmacy Consult for heparin Indication: DVT  Allergies  Allergen Reactions  . Keppra [Levetiracetam] Swelling    Patient experienced angioedema post inpatient keppra dose  . Latex Itching    Skin turns real red    Patient Measurements: Height: 6\' 2"  (188 cm) Weight: 100.1 kg (220 lb 10.9 oz) IBW/kg (Calculated) : 82.2 Heparin Dosing Weight: 100.1 kg   Vital Signs: Temp: 99.5 F (37.5 C) (04/19 1200) Temp Source: Axillary (04/19 1200) BP: 134/73 (04/19 1200) Pulse Rate: 97 (04/19 1200)  Labs: Recent Labs    10/15/20 0313 10/16/20 0815 10/17/20 0642  HGB 10.7* 10.9* 11.0*  HCT 32.9* 34.1* 35.1*  PLT 207 201 148*  CREATININE 0.87 0.95 1.14    Estimated Creatinine Clearance: 81.7 mL/min (by C-G formula based on SCr of 1.14 mg/dL).   Medical History: Past Medical History:  Diagnosis Date  . Atrial flutter (HCC)   . Dizzy 09/25/13  . Near syncope 09/25/13    Medications:  Medications Prior to Admission  Medication Sig Dispense Refill Last Dose  . Bioflavonoid Products (ESTER-C) 500-550 MG TABS Take 1 tablet by mouth daily.   Past Week at Unknown time  . cholecalciferol (VITAMIN D3) 25 MCG (1000 UNIT) tablet Take 1,000 Units by mouth daily.   Past Week at Unknown time  . diltiazem (CARDIZEM CD) 120 MG 24 hr capsule Take 1 capsule (120 mg total) by mouth daily. 90 capsule 3 09/24/2020 at am  . EPINEPHrine 0.3 mg/0.3 mL IJ SOAJ injection Inject 0.3 mg into the muscle once as needed for anaphylaxis.   never  . diltiazem (CARDIZEM) 30 MG tablet Take 1 tablet (30 mg total) by mouth daily as needed (1-2 tablets every 4-6 hrs for fast heart rate.). (Patient not taking: No sig reported) 30 tablet 3 Not Taking at Unknown time    Assessment: 43 YOM who presented to the hospital with an intraparenchymal hemorrhage later found to have a DVT. Given his recent ICH, he received an IVC filter but now starting therapeutic anticoag per  neurology.   H/H low stable, Plt down. SCr wnl  Goal of Therapy:  Heparin level 0.3-0.5 units/ml Monitor platelets by anticoagulation protocol: Yes   Plan:  -Start heparin conservatively at 1250 units/hr per stroke protocol. NO BOLUS -F/u 6 hr HL -Monitor daily HL, CBC and for s/s of bleeding  76, PharmD., BCPS, BCCCP Clinical Pharmacist Please refer to [facility] for unit-specific pharmacist

## 2020-10-17 NOTE — Progress Notes (Signed)
Transported pt to and from  CT scan on full support and 100% FIO2.  Tolerated well.

## 2020-10-17 NOTE — Consult Note (Signed)
Date of Admission:  09/25/2020          Reason for Consult:FUO    Referring Provider: Dr. Erlinda Hong   Assessment:  1.  FUO 2. Hemorrhagic stroke requiring extraventricular drain 3. Hypoxic respiratory failure requiring intubation now status post tracheostomy 4. Encephalopathy 5. Treatment for Mark sella pneumonia 6. Treatment for Acinetobacter bacteremia 7. Bilateral DVTs discovered on 10 April 8. History of atrial fibrillation flutter 9. History of prior ablation 10. COVID-19 infection in September 2021   Plan:  1. Will obtain CT chest abdomen pelvis with contrast 2. Will send serologies and labs for FUO 3. Follow-up respiratory cultures 4. Continue to monitor for pertinent diagnostic clues 5. If no clear-cut pyogenic source of fever identified would recommend stopping antibiotics and observing off antibiotics (this certainly could be a central fever  Active Problems:   ICH (intracerebral hemorrhage) (HCC)   Intracranial hemorrhage (HCC)   Atrial fibrillation (HCC)   Prediabetes   Leukocytosis   Acute blood loss anemia   Hypernatremia   Scheduled Meds: . iohexol      . amantadine  200 mg Per Tube BID  . bethanechol  10 mg Per Tube TID  . chlorhexidine gluconate (MEDLINE KIT)  15 mL Mouth Rinse BID  . Chlorhexidine Gluconate Cloth  6 each Topical Daily  . diltiazem  30 mg Per Tube Q6H  . feeding supplement (PROSource TF)  45 mL Per Tube BID  . fiber  1 packet Per Tube BID  . heparin injection (subcutaneous)  5,000 Units Subcutaneous Q8H  . insulin aspart  0-20 Units Subcutaneous Q4H  . insulin aspart  4 Units Subcutaneous Q4H  . insulin glargine  6 Units Subcutaneous Daily  . mouth rinse  15 mL Mouth Rinse 10 times per day  . ondansetron (ZOFRAN) IV  4 mg Intravenous Q6H  . pantoprazole sodium  40 mg Per Tube QHS  . sodium chloride flush  10-40 mL Intracatheter Q12H   Continuous Infusions: . sodium chloride Stopped (09/30/20 1324)  . ampicillin-sulbactam  (UNASYN) IV 200 mL/hr at 10/17/20 0600  . feeding supplement (VITAL 1.5 CAL) 65 mL/hr at 10/16/20 1300   PRN Meds:.sodium chloride, acetaminophen **OR** acetaminophen (TYLENOL) oral liquid 160 mg/5 mL **OR** acetaminophen, fentaNYL (SUBLIMAZE) injection, fentaNYL (SUBLIMAZE) injection, guaiFENesin-dextromethorphan, hydrALAZINE, labetalol, oxyCODONE, sodium chloride flush  HPI: Walter [name] is a 65 y.o. male with history of atrial fibrillation prior ablation in 2021 who was not on anticoagulation was admitted with profound right-sided weakness and found to have a hemorrhagic stroke on September 25, 2020.  He required placement of extraventricular drain on the 29th due to the patient developing hydrocephalus.  He was extubated on 30 March.  He had a respiratory culture that grew Moraxella and was started on ceftriaxone on April 2 which was given for several days for being switched to Unasyn she took for 4 additional days.  Of note the patient continued to fever through all of the antibiotics just mentioned.  On the eighth blood cultures were taken as well as urine cultures and he was broadened to vancomycin and cefepime.  He remained on those for 2 days and then changed over to cefepime alone when Acinetobacter bowel Monie grew from 1 of 2 blood culture sites.  He remained on cefepime through yesterday.  Had been on a cooling blanket which was removed I believe on the 14th.  He then began having low-grade temperatures again 100.6 101.6 and then yesterday 101.7.  White count  also climbed to 12,200.  Tracheal cultures were sent and he was started back on Unasyn.  We were consulted with regards to the patient's fevers of unknown origin.  Note the patient was found to have bilateral DVTs which certainly is a known source of fever.  His hemorrhagic stroke itself could be causing a central fever.  He certainly could also have fevers in the context of his respiratory failure and undoubted atelectasis.  We  will get a CT chest abdomen pelvis with contrast to start as well as sending off serologies for HIV hepatitis panel CMV EBV serologies LDH ANA rheumatoid factor SPEP cryoglobulins and other FUO labs.  If we do not find a target for antibiotics it would be prudent to get him off of antibiotics because he has nearly been on antibiotics every day of this admission and his Acinetobacter is already a marker for multidrug resistance being likely in the future.   Review of Systems: Review of Systems  Unable to perform ROS: Critical illness    Past Medical History:  Diagnosis Date  . Atrial flutter ([place])   . Dizzy 09/25/13  . Near syncope 09/25/13    Social History   Tobacco Use  . Smoking status: Never Smoker  . Smokeless tobacco: Never Used  Substance Use Topics  . Alcohol use: Yes    Comment: occasionally  . Drug use: No    Family History  Problem Relation Age of Onset  . Heart disease Mother   . Heart disease Father   . Healthy Sister   . Healthy Brother    Allergies  Allergen Reactions  . Keppra [Levetiracetam] Swelling    Patient experienced angioedema post inpatient keppra dose  . Latex Itching    Skin turns real red    OBJECTIVE: Blood pressure (!) 146/78, pulse (!) 101, temperature 99 F (37.2 C), temperature source Axillary, resp. rate (!) 22, height '6\' 2"'  (1.88 m), weight 100.1 kg, SpO2 94 %.  Physical Exam Constitutional:      Appearance: He is well-developed.  HENT:     Head: Normocephalic and atraumatic.  Eyes:     Conjunctiva/sclera: Conjunctivae normal.  Neck:     Comments: [name] Cardiovascular:     Rate and Rhythm: Regular rhythm. Tachycardia present.     Heart sounds: No murmur heard. Friction rub present. No gallop.   Pulmonary:     Effort: No respiratory distress.     Breath sounds: Rhonchi present. No wheezing.  Abdominal:     General: There is no distension.     Palpations: Abdomen is soft.  Musculoskeletal:        General: No  tenderness. Normal range of motion.     Cervical back: Normal range of motion and neck supple.  Skin:    General: Skin is warm and dry.     Coloration: Skin is pale.     Findings: No erythema or rash.  Neurological:     Mental Status: He is unresponsive.   PICC line not overtly infected No significant rashes  Lab Results Lab Results  Component Value Date   WBC 12.2 (H) 10/17/2020   HGB 11.0 (L) 10/17/2020   HCT 35.1 (L) 10/17/2020   MCV 100.3 (H) 10/17/2020   PLT 148 (L) 10/17/2020    Lab Results  Component Value Date   CREATININE 1.14 10/17/2020   BUN 42 (H) 10/17/2020   NA 140 10/17/2020   K 4.2 10/17/2020   CL 103 10/17/2020   CO2  27 10/17/2020    Lab Results  Component Value Date   ALT 126 (H) 10/17/2020   AST 56 (H) 10/17/2020   ALKPHOS 129 (H) 10/17/2020   BILITOT 0.8 10/17/2020     Microbiology: Recent Results (from the past 240 hour(s))  C Difficile Quick Screen (NO PCR Reflex)     Status: None   Collection Time: 10/08/20 11:07 AM   Specimen: STOOL  Result Value Ref Range Status   C Diff antigen NEGATIVE NEGATIVE Final   C Diff toxin NEGATIVE NEGATIVE Final   C Diff interpretation No C. difficile detected.  Final    Comment: Performed at [facility], [place] [address] [place], [place] [address]  Culture, Respiratory w Gram Stain     Status: None (Preliminary result)   Collection Time: 10/16/20 10:56 AM   Specimen: Tracheal Aspirate; Respiratory  Result Value Ref Range Status   Specimen Description TRACHEAL ASPIRATE  Final   Special Requests NONE  Final   Gram Stain   Final    RARE SQUAMOUS EPITHELIAL CELLS PRESENT MODERATE WBC PRESENT, PREDOMINANTLY PMN FEW YEAST FEW GRAM POSITIVE COCCI    Culture   Final    CULTURE REINCUBATED FOR BETTER GROWTH Performed at [facility], [place] [address] [place],  [address]    Report Status PENDING  Incomplete    Alcide Evener, [place] [phone] pager  10/17/2020, 10:15 AM

## 2020-10-17 NOTE — Progress Notes (Signed)
Transported patient to CT and back to [id number] without event.

## 2020-10-17 NOTE — Progress Notes (Addendum)
STROKE TEAM PROGRESS NOTE   INTERVAL HISTORY RN is at the bedside.  Patient lying in bed, on trach collar, eyes closed initially but open on voice, able to keep open during the encounter. Able to track me on the left, but still did not follow other commands. RN told me that he was sticking out his tongue on commands earlier. Did not move extremities. Still have over night fever, CXR much improved, now ID on board, will do CT abd/pelvis. Still on unasyn, repeat CT showed ICH and IVH near absorbed, will start heparin IV.   Vitals:   10/17/20 1000 10/17/20 1100 10/17/20 1129 10/17/20 1200  BP: (!) 141/78 (!) 141/82  134/73  Pulse: 96 89 91 97  Resp: (!) 31 (!) 36 (!) 31 (!) 33  Temp:    99.5 F (37.5 C)  TempSrc:    Axillary  SpO2: 97% 98% 100% 100%  Weight:      Height:       CBC:  Recent Labs  Lab 10/16/20 0815 10/17/20 0642  WBC 10.6* 12.2*  HGB 10.9* 11.0*  HCT 34.1* 35.1*  MCV 99.1 100.3*  PLT 201 148*   Basic Metabolic Panel:  Recent Labs  Lab 10/16/20 0815 10/17/20 0642  NA 137 140  K 4.2 4.2  CL 101 103  CO2 28 27  GLUCOSE 186* 216*  BUN 28* 42*  CREATININE 0.95 1.14  CALCIUM 8.6* 8.9  MG 2.5* 2.7*    IMAGING  CT head 4/6 1. Ventricle size shows mild interval enlargement since yesterday. No significant hydrocephalus. Intraventricular hemorrhage stable 2. Right frontal ventricular drain unchanged. Hemorrhage surrounding the drain unchanged. 3. Subarachnoid hemorrhage unchanged. Hemorrhage in the left tectum unchanged. No new hemorrhage identified.  CT head 4/4 1. Interval decrease in intraventricular hemorrhage with decreased size of the lateral ventricles as compared to prior, right greater than left. Right parietal approach ventriculostomy in stable position with tip near the septum pellucidum. 2. Stable small volume subarachnoid and intraparenchymal hemorrhage along the catheter tract. 3. Stable 1.9 cm hemorrhage at the left dorsal pons. 4. No  other new acute intracranial abnormality. 09/29/2020  CT head 4/3 IMPRESSION: 1. New intraventricular hemorrhage in the third and lateral ventricles. Fourth ventricular and midbrain hemorrhage is unchanged. 2. New right frontal EVD which traverses the right lateral ventricle. Blood clot encompasses the lower catheter and ventriculomegaly is similar to prior. 3. Small cortical infarcts are newly seen along the superior left frontal convexity.  09/26/2020 IR Angio IMPRESSION: 1. No evidence of an AVM, dural arteriovenous fistula, aneurysm or other vascular abnormality to explain patient's brainstem hemorrhage.  09/26/2020 CT head:  IMPRESSION: 1. Unchanged left brainstem hemorrhage extending into the fourth ventricle. 2. Worsening hydrocephalus.  09/25/2020 MRI brain w/wo, MR Venogram IMPRESSION: 1. Acute hemorrhage centered within the left dorsal midbrain extending into the dorsal pons and inferomedial thalamus with intraventricular extension. No abnormal enhancement to suggest underlying lesion. 2. Compression of the cerebral aqueduct with possible mild obstructive hydrocephalus. 3. No evidence of dural sinus thrombosis. Attenuation of the left basal vein in the quadrigeminal cistern without definite occlusion.  09/25/2020 CT Angio head/neck IMPRESSION: 1. No vascular malformation, anomaly or aneurysm. 2. No emergent large vessel occlusion or high-grade stenosis of the intracranial arteries. 3. Unchanged size of dorsal left pontine hemorrhage with intraventricular extension.  09/25/2020 CT head code stroke IMPRESSION: Acute intraparenchymal hemorrhage in the dorsal left pons with subarachnoid extension into the cerebral aqueduct and fourth Ventricle.  EEG-LTM 4/5-4/6  This  study showed periodic discharges with triphasic morphology at 1.5 to 2.5 Hz which is on the ictal-interictal continuum with low to intermediate potential for seizures.  Additionally there is  evidence of moderate to severe diffuse encephalopathy, nonspecific etiology.  No seizures were seen during the study  EEG adult 10/13/2020 This study is suggestive of moderate diffuse encephalopathy, nonspecific etiology. No seizures or definite epileptiform discharges were seen throughout the recording. Walter [name]   CT HEAD WO CONTRAST  Result Date: 10/17/2020 CLINICAL DATA:  Follow-up intracranial hemorrhage EXAM: CT HEAD WITHOUT CONTRAST TECHNIQUE: Contiguous axial images were obtained from the base of the skull through the vertex without intravenous contrast. COMPARISON:  10/11/2020 FINDINGS: Brain: Intraparenchymal hemorrhage in the left side the upper pons and midbrain no longer shows any hyperdense components. There is an intra-axial cystic space measuring up to 15 mm in diameter, which may communicate with the fourth ventricle. No focal cerebellar abnormality is seen. Continuing diminishing density of blood along the ventriculostomy track in the right frontal region. Persistent brain edema surrounding that. Small amount of subarachnoid blood possibly subdural blood the ventriculostomy entry site appears similar. No evidence of any new or increased bleeding. Small cortical infarctions of the left frontoparietal vertex are barely appreciable. No evidence of new brain infarction. Ventricular size remains stable without evidence of ongoing aqueductal obstruction. Previously seen hyperdense blood dependent within the occipital horns is diminishing. Vascular: No vascular finding. Skull: Normal other than the right frontal burr hole. Sinuses/Orbits: Paranasal sinuses are clear. Orbits are normal. No fluid in the middle ears. Small mastoid effusions. Other: None IMPRESSION: 1. Continuing diminishing density of blood along the ventriculostomy track in the right frontal region. No evidence of any new or increased bleeding. Diminishing dependent intraventricular blood in the lateral ventricles. Very small  amount of subarachnoid blood and possibly subdural blood at the ventriculostomy entry site appears similar. 2. Small cortical infarctions of the left frontoparietal vertex are barely appreciable. No evidence of new or increased infarction. 3. Ventricular size remains stable without evidence of ongoing aqueductal obstruction. 4. 15 mm intra-axial cystic space in the left side of the upper pons and midbrain at the site of the previous hematoma. This may communicate with the fourth ventricle. Electronically Signed   By: Paulina Fusi M.D.   On: 10/17/2020 07:35   DG CHEST PORT 1 VIEW  Result Date: 10/17/2020 CLINICAL DATA:  Fever. EXAM: PORTABLE CHEST 1 VIEW COMPARISON:  10/06/2020 FINDINGS: A tracheostomy tube remains in place and overlies the airway. A feeding tube courses into the abdomen with tip not imaged. The cardiomediastinal silhouette is unchanged with normal heart size. Lung aeration has improved with minimal residual opacities in the left greater than right lung bases. No edema, sizable pleural effusion, pneumothorax is identified. IMPRESSION: Improved lung aeration with minimal residual bibasilar opacities likely reflecting atelectasis. Electronically Signed   By: Sebastian Ache M.D.   On: 10/17/2020 10:33    PHYSICAL EXAM  Temp:  [99 F (37.2 C)-101.7 F (38.7 C)] 99.5 F (37.5 C) (04/19 1200) Pulse Rate:  [84-116] 97 (04/19 1200) Resp:  [18-36] 33 (04/19 1200) BP: (113-146)/(73-88) 134/73 (04/19 1200) SpO2:  [94 %-100 %] 100 % (04/19 1200) FiO2 (%):  [30 %-40 %] 40 % (04/19 1129)  General - Well nourished, well developed, on trach collar  Ophthalmologic - fundi not visualized due to noncooperation.  Cardiovascular - Regular rate and rhythm, not in afib.  Neuro - on trach collar now, eyes initially closed, but open  with voice and able to keep them open during encounter, did not follow commands with me but RN reported tongue protrusion on command. With eye opening, eyes in mid  position, not consistently blinking to visual threat, doll's eyes present, intermittent tracking to voice on the left, PERRL. Corneal reflex, gag and cough present.  Tongue protrusion not cooperative. On pain stimulation, slight withdraw movement in all extremities. DTR diminished and no babinski. Sensation, coordination not cooperative and gait not tested.   ASSESSMENT/PLAN Walter [name] is a 65 y.o. male with history of atrial flutter newly diagnosed with atrial fibrillation in February 2022 not on anticoagulation, follows with Dr. Johney Frame in cardiology.  He presented with right-sided weakness, slurred speech, headache, and vomiting.Last known well when he went to bed at 10 PM on 09/24/2020 and woke up at around 2 AM on the day of admission with slurred speech.  EMS was called.He was very drowsy upon their assessment and notable right side weakeness.  He had worsening mental status, GCS 5, so was emergently intubated in ED for airway protection.  His MRI brain showed acute hemorrhage at left midbrain extending into pons with IVH associated with obstructive hydrocephalus.    ICH - Left Dorsalmidbrain ICHextending into dorsal pons and inferior thalamus with mild mass-effect on third ventricle and hydrocephalus,source unknown, suspect occult cavernoma  Code Stroke CT head-Acute IPH in the dorsal left pons with subarachnoid extension into the cerebral aqueduct and fourth ventricle.  CTA head & neck-No vascular malformation, anomaly or aneurysm. No emergent large vessel occlusion or high-grade stenosis. Unchanged size of dorsal left pontine hemorrhage with intraventricular extension.  MRI& MR Venogram-Acute hemorrhage centered within the left dorsal midbrain extending into the dorsal pons and inferomedial thalamus with intraventricular extension. Compression of the cerebral aqueduct with possible mild obstructive hydrocephalus. No evidence of dural sinus thrombosis.   CT Head W/O  Contrast 3/29 0250 -Unchanged left brainstem hemorrhage extending into the fourth ventricle. Worsening hydrocephalus.  Diagnostic Cerebral Angiogram - No evidence of an aneurysm, AVM, dural AV fistula or other vascular abnormality to explain patient's known intraparenchymal hemorrhage.  MRI brain 4/8 -stable IVH, midbrain ICH and right frontal EVD tract hemorrhage.  Ventricular size stable.  2-3 punctate infarcts left frontal and parietal lobes, corresponding to small acute/subacute infarcts.  CT head 4/13 stable IVH, ICH, decreased ventricle size  CT repeat 4/19 continue evolution of ICH and IVH, nearly absorbed now  2D Echo- EF:55-60%. No wall motion abnormalities.  LDL- 88  HgbA1c5.7  VTE prophylaxis - subcu heparin  No antithromboticprior to admission, will start heparin IV today.   Therapy recommendations:CIR vs LTAC  Disposition:Pending  Obstructive hydrocephalus   EVD placed on 3/30   CT 4/1 showed clot in right lateral ventricle around catheter.   S/p 1mg  (1cc) alteplase placed in EVD 09/29/20 -> drain well ->ICP measuring 4-11 with a good waveform  CT head (4/3) resolution of most of the IVH and improved hydrocephalus.  EVD stopped working 4/2 overnight. 0.5mg  alteplase placed in EVD 10/01/2020 -> Post alteplase CT head new hemorrhage along the EVD tract an in the right lateral ventricle.  CT repeat 4/4 - decreased IVH and decompressed right lateral ventricle  CT repeat 4/5 -mild interval enlargement of ventricular size, no significant hydrocephalus.  IVH stable.  HCT 4/7 stable, with no appreciable change in overall ventricular size  HCT 4/13 decreased ventricular size  CT repeat 4/19 continue evolution of ICH and IVH, nearly absorbed now  4/16 will remove scalp  staples.   Acute Respiratory Failure  Required intubation on 4/4 due to worsening MS and hypoxia  S/p trach 10/04/20  Off sedation  On trach collar with copious secretions  On robitussin  PRN   Bilateral DVT  LE venous Doppler 4/10 with bilateral DVTs  Status post IVC filter  Not a candidate for anticoagulation at this time  Will start heparin IV today  [facility] Pneumonia Community-acquired pneumonia with Moraxella Fever   Tmax 100.6->100.4->101.5->101.7  Off arctic sun  Sputum culture (3/30): Moraxella  Sputum culture 4/5: neg  WBC 9.8->8.5->8.7->8.9->10.6->12.2  CXR 4/7, 4/8 left basilar opacities  CXR 4/19 unremarkable  Blood culture 4/8 positive for Acinetobacter  Off cefepime and vanco -> unasyn   PICC removed  ID on board - CT abd/pelvis pending  SIRPIDs, resolved Severe encephalopathy, improving  Left UE intermittent jerking with stimulation  LTM EEG: GPEDs with triphasic wave, generalized SIRPIDs, although focal seizure is in DDx  S/p keppra 10/03/20 -> d/c concerning for angioedema -> on vimpat 10/04/20-> off vimpat 10/11/20  EEG 4/4 moderate to severe diffuse encephalopathy   Repeat spot EEG 4/15 moderate diffuse encephalopathy  Angioedema, resolved  Upper lip edema, much improved  Concerning for keppra allergy  Hypertension  Home meds:Diltiazem  Currently on Diltiazem 30 mg q6  off clonidine  BP goal Systolic < 160  Long-term BP goal normotensive  Hx of Aflutter PAF  Not on [facility] before admission  S/p CTI ablation for A flutter  off ASA  Not AC candidate at this time.  Afib with RVR intermittent -> sinus now  Will start heparin IV today  Hyperlipidemia  Home meds:none  LDL88, goal < 70  No statin now due to elevated LFTs  May consider to addstatin on discharge if LFT normalized  Dysphagia  Secondary to hemorrhagic stroke  NPO  SLP on board  Tube feeds @ 63ml/hr and IVF@ 25  May require PEG, family in agreement, will discuss with CCM and ID once fever improved  Other Stroke Risk Factors  Advanced Age >/= 45  Snoring: sleep study denied by insurance prior to admission   Other  Active Problems:  Hypokalemia  Lethargic - on amantadine   Hospital day #22  This patient is critically ill due to ICH, IVH, hydrocephalus, spesis, fever, s/p trach and at significant risk of neurological worsening, death form respiratory failure, obstructive hydrocephalus, sepsis, seizure. This patient's care requires constant monitoring of vital signs, hemodynamics, respiratory and cardiac monitoring, review of multiple databases, neurological assessment, discussion with family, other specialists and medical decision making of high complexity. I spent 35 minutes of neurocritical care time in the care of this patient. I discussed with Dr. Merrily Pew CCM.   Marvel Plan, MD PhD Stroke Neurology 10/17/2020 12:13 PM

## 2020-10-17 NOTE — Progress Notes (Signed)
Occupational Therapy Treatment Patient Details Name: Walter [name] MRN: [id number] DOB: 1956-03-03 Today's Date: 10/17/2020    History of present illness 65 yo male presenting 3/28 with R-sided weakness and slurred speech. Imaging revealed acute hemorrhage centered within the left dorsal midbrain extending into the dorsal pons and inferomedial thalamus with intraventricular extension. EVD placement for developing hydrocephalus on 3/29. Extubated 3/30. Pt with noted decrease in EVD drainage overnight 3/31, proximal and distal clots noted, flushed, repeat CT shows significant clot in R lateral ventricle. EVD with another episode of clotting overnight 4/2, intraventricular TpA administered. Reintubated 4/4. 4/6 tracheostomy. 4/10 Doppler shows bilateral DVT. IVC filter placed. CT at chest showing bilateral (R>L) PEs on 4/19. PMH Afib, COVID infection (July 2021), and retinal detachment.   OT comments  Upon arrival, pt resting and supine in bed. RN reports recent chest CT and agreeable for therapy as pt tolerating. Pt requiring Total A +2 for repositioning in bed and optimizing upright posture. Facilitating PROM at BUE/BLEs. Pt's daughter arriving at end of session. Pt closing eyes for majority of session. Donning bilateral resting hand splints and elevating BUEs. Use of music during session. Continue to recommend post-acute rehab and will continue to follow acutely as admitted.   HR 93, RR 20, SpO2 98% on trach vent at 100% O2 conc and PEEP of 5. BP supine 132/79 (94).     Follow Up Recommendations  SNF    Equipment Recommendations  Wheelchair cushion (measurements OT);Wheelchair (measurements OT);Hospital bed;Other (comment) (respiratory needs at this time)    Recommendations for Other Services Other (comment) (Palliative)    Precautions / Restrictions Precautions Precautions: Fall Precaution Comments: flexiseal Required Braces or Orthoses: Other Brace Other Brace: BUE resting hand  splints, see schedule       Mobility Bed Mobility               General bed mobility comments: Total A for repositioning in the bed    Transfers                 General transfer comment: Defer due to vent setting    Balance                                           ADL either performed or assessed with clinical judgement   ADL                                         General ADL Comments: Total A for ADLs     Vision   Vision Assessment?: Yes Eye Alignment: Impaired (comment) (dysconjugate gaze)   Perception     Praxis      Cognition Arousal/Alertness: Lethargic (intermittently alert for short periods) Behavior During Therapy: Flat affect Overall Cognitive Status: Difficult to assess                                 General Comments: Keeping eye closed at begining of session. Pt with moments of making eye contact at end of session. RN requesting pt to "stick out his tongue" and pt nodding no. Overall, not following one step commands        Exercises Exercises: General Upper Extremity;General Lower Extremity General Exercises -  Upper Extremity Shoulder Flexion: PROM;Both;10 reps;Supine Shoulder ABduction: PROM;Both;10 reps;Seated Shoulder ADduction: PROM;Both;10 reps;Supine Elbow Flexion: PROM;Both;10 reps;Supine Elbow Extension: PROM;Both;10 reps;Supine Wrist Flexion: PROM;Both;10 reps;Supine Wrist Extension: PROM;Both;10 reps;Supine Digit Composite Flexion: PROM;Both;10 reps;Supine Composite Extension: PROM;Both;10 reps;Supine General Exercises - Lower Extremity Ankle Circles/Pumps: PROM;Both;15 reps;Seated Short Arc Quad: PROM;Both;10 reps;Seated Heel Slides: PROM;Both;10 reps;Supine Hip ABduction/ADduction: PROM;Both;10 reps;Supine Other Exercises Other Exercises: Placement of bilateral resting hadn splints; notified RN   Shoulder Instructions       General Comments HR 93, RR 20, SpO2 98%  on trach vent at 100% O2 conc and PEEP of 5. BP supine 132/79 (94). Daughter Santina Evans) arriving at end of session. Renaee Munda msuic this session - Neldon Mc    Pertinent Vitals/ Pain       Pain Assessment: Faces Faces Pain Scale: Hurts even more Pain Location: grimacing with both flat supine in bed and with DF of bilateral ankles (could be related to DVT Pain Descriptors / Indicators: Grimacing (withdarwal of BLE) Pain Intervention(s): Monitored during session;Limited activity within patient's tolerance;Repositioned  Home Living                                          Prior Functioning/Environment              Frequency  Min 2X/week        Progress Toward Goals  OT Goals(current goals can now be found in the care plan section)  Progress towards OT goals: Not progressing toward goals - comment  Acute Rehab OT Goals Patient Stated Goal: Unstated OT Goal Formulation: With patient Time For Goal Achievement: 10/27/20 Potential to Achieve Goals: Fair ADL Goals Pt Will Perform Grooming: with max assist;bed level Pt Will Perform Upper Body Dressing: with max assist;bed level Pt Will Transfer to Toilet: with +2 assist;with max assist;bedside commode;stand pivot transfer Additional ADL Goal #1: Pt will perform bed mobility with Max A +2 in preparation for ADLs Additional ADL Goal #2: pt will tolerate sitting EOB 10 minutes with mod (A) as precursor to adls  Plan Discharge plan remains appropriate    Co-evaluation                 AM-PAC OT "6 Clicks" Daily Activity     Outcome Measure   Help from another person eating meals?: Total Help from another person taking care of personal grooming?: Total Help from another person toileting, which includes using toliet, bedpan, or urinal?: Total Help from another person bathing (including washing, rinsing, drying)?: Total Help from another person to put on and taking off regular upper body clothing?:  Total Help from another person to put on and taking off regular lower body clothing?: Total 6 Click Score: 6    End of Session Equipment Utilized During Treatment: Oxygen (Vent at trach)  OT Visit Diagnosis: Unsteadiness on feet (R26.81);Other abnormalities of gait and mobility (R26.89);Muscle weakness (generalized) (M62.81);Hemiplegia and hemiparesis Hemiplegia - Right/Left: Right Hemiplegia - dominant/non-dominant: Dominant Hemiplegia - caused by: Cerebral infarction   Activity Tolerance Patient tolerated treatment well   Patient Left in bed;with call bell/phone within reach;with bed alarm set;with family/visitor present;with nursing/sitter in room;Other (comment) (trach)   Nurse Communication Mobility status;Precautions        Time: [phone] OT Time Calculation (min): 28 min  Charges: OT General Charges $OT Visit: 1 Visit OT Treatments $Therapeutic Activity: 23-37 mins  Zaim Nitta MSOT, OTR/L  Acute Rehab Pager: [phone] Office: [phone]   Theodoro Grist Tanay Misuraca 10/17/2020, 5:56 PM

## 2020-10-17 NOTE — Progress Notes (Addendum)
NAME:  Walter [name], MRN:  [id number], DOB:  08-Nov-1955, LOS: 68 ADMISSION DATE:  09/25/2020, CONSULTATION DATE:  09/25/20 REFERRING MD:  Rory Percy,  CHIEF COMPLAINT:  Headache, r side weakness, slurred speech  History of Present Illness:  Walter [name] is a 65 yo man with hx of afib not on anticoagulation admitted with right-sided weakness, hemorrhagic stroke. Emergently intubated in the ED for airway protection  Pertinent  Medical History  Aflutter/afib (not on ac) S/p Ablation 03/2020, hx of Cardioversion 2015 Hx retinal detachment Covid infection in July 2021 treated with monoclonal antibody  Significant Hospital Events: Including procedures, antibiotic start and stop dates in addition to other pertinent events   . 3/28- Admit, intubated.  Not a candidate for TPA due to intracranial hemorrhage.  Started 3% saline . 3/29- EVD placement for developing hydrocephalus. CTA of the head was unremarkable . 3/30- Extubated. . 4/1 respiratory culture grew Moraxella, on ceftriaxone . 4/4 reintubated because of hypoxia and unresponsiveness . 4/6 tracheostomy . 4/7 still encephalopathic  . 4/8 remains encephalopathic . 4/10 Doppler shows bilateral DVT. IVC filter placed.  . 4/11 Blood cultures positive for Acinetobacter  . 4/12 Fever no worse off [place] Sun, off sedative infusions, tolerating trach collar.  . 4/13 Febrile @ 7am . 4/14 - 4/17 4/16 Afebrile Intermittently following simple commands and participated in PT, drowsy today . 4/18 febrile. Starting unasyn, sending trach asp . 4/19 T Max 101.7, WBC 12.2, ALT elevated, profoundly deconditioned  Interim History / Subjective:  T Max 101.7 last 24 WBC 12.2, HGB 11, PLTs 148 ALT 126, Glucose 216 Net + 6L, 1900 cc of that Urine Was able to wean on PS for a short time 4/19, but never made it to trach collar. Currently on full support 2/2 increase in secretions Profoundly deconditioned   Objective   Blood pressure 113/77, pulse 93,  temperature 99 F (37.2 C), temperature source Axillary, resp. rate 20, height _0  (1.88 m), weight 100.1 kg, SpO2 99 %.    Vent Mode: PRVC FiO2 (%):  [30 %] 30 % Set Rate:  [20 bmp] 20 bmp Vt Set:  [650 mL] 650 mL PEEP:  [5 cmH20] 5 cmH20 Plateau Pressure:  [12 cmH20-16 cmH20] 16 cmH20   Intake/Output Summary (Last 24 hours) at 10/17/2020 0813 Last data filed at 10/17/2020 0700 Gross per 24 hour  Intake 2023.98 ml  Output 1850 ml  Net 173.98 ml   Filed Weights   10/13/20 0344 10/15/20 0400 10/16/20 0400  Weight: 106 kg 103 kg 100.1 kg    Physical Exam: General: Elderly male supine in bed on full vent support, NAD HENT: NCAT. Pink dry  mm. Trach secure and intact,  Tan to blood tinged , thick secretions Respiratory: Bilateral chest excursion, even and unlabored , ventilator assisted, diminished per bases Cardiovascular: rrr s1s2 no rgm  GI: Soft round ndnt , BS +, Body mass index is 28.33 kg/m. Extremities:No obvious deformities, BLE minimal pitting edema. No cyanosis or clubbing. No acute joint deformity  Neuro: Opens eyes to call of name, follows intermittent command to nod head, ? LLE movement, made eye contact.   Labs/imaging that I havepersonally reviewed  (right click and "Reselect all SmartList Selections" daily)   4/8 BCx acinetobacter x1 4/18 trach asp>>few yeast, few GP cocci>> 10/17/2020>> CT Head Continuing diminishing density of blood along the ventriculostomy track in the right frontal region. No evidence of any new or increased bleeding. Diminishing dependent intraventricular blood in the lateral ventricles. Very  small amount of subarachnoid blood and possibly subdural blood at the ventriculostomy entry site appears similar. 2. Small cortical infarctions of the left frontoparietal vertex are barely appreciable. No evidence of new or increased infarction. 3. Ventricular size remains stable without evidence of ongoing aqueductal obstruction. 4. 15 mm  intra-axial cystic space in the left side of the upper pons and midbrain at the site of the previous hematoma. This may communicate with the fourth ventricle.  Resolved Hospital Problem list   AKI  Hypernatremia Moraxella catarrhalis pneumonia s/p antibiotics Thrombocytopenia  Assessment & Plan:   Acute midbrain/dorsal pons/thalamic IPH Acute obstructive hydrocephalus s/p EVD  Acute encephalopathy secondary IPH, delirium, sedation  P -Imaging and management per primary team -Hold on anticoagulation -Minimize sedating drugs  -PRN oxycodone for pain  Acute hypoxic respiratory failure s/p tracheostomy  Moraxella catarrhalis PNA (has been tx)  -having some incr tracheal secretions 4/19, diuresed 4/18 for 1900 cc's P -Wean to Trach collar, ATC as tolerated -routine trach care  -diuresed 4/18 >> slight bump creat 4/19 -send trach aspirate 4/18, start unasyn  - CXR 4/19 and prn  Fever: central vs infectious  - had a 7d course of cefepime for acinetobacter -was afebrile for 72 hrs off cooling blanket and IV sedation. Stopped bromocriptine and buspirone.  P -sending trach asp 4/18, starting empiric unasyn (would cover acinetobacter, possible resp sources) -Continue acetaminophen PRN for fever - Trend WBC and fever curve - CXR 4/19 and prn - Culture as is clinically indicated - Follow micro - Will consult ID  DVT, bilateral -IVC filter until 4/24 - then should be able to transition to anticoagulation and remove filter.   A fib - sinus 4/18  -Telemetry, q6hr dilt   Urinary retention  -foley  -urecholine   Elevated AST Slightly Elevated Alk Phos  Plan Trend LFT's Consider US abdomen  Elevated Glucose ( ? 2/2 fever) Plan Will add TF coverage every 4 hours. Continue CBG's  Q 4 Hours   Best Practice (right click and "Reselect all SmartList Selections" daily)   Diet:  Tube Feed  on fiber for diarrhea  Pain/Anxiety/Delirium protocol (if indicated): RASS goal -1.  Convert to enteral agents.  VAP protocol (if indicated): Yes DVT prophylaxis: SCD heparin - IVC filter in place. Consider starting full dose AC on 4/24 and retrieving filter.   GI prophylaxis: PPI Glucose control:  SSI Yes Central venous access:  N/A Arterial line:  N/A Foley:  Yes, and it is still needed - Diuresis Mobility:  bed rest  PT consulted: Yes Code Status:  full code Prognosis: Guarded Last date of multidisciplinary goals of care discussion 4/12 with primary team   CRITICAL CARE Performed by: Magdalen Spatz   Total critical care time: 35 minutes  Critical care time was exclusive of separately billable procedures and treating other patients.  Critical care was necessary to treat or prevent imminent or life-threatening deterioration.  Critical care was time spent personally by me on the following activities: development of treatment plan with patient and/or surrogate as well as nursing, discussions with consultants, evaluation of patient's response to treatment, examination of patient, obtaining history from patient or surrogate, ordering and performing treatments and interventions, ordering and review of laboratory studies, ordering and review of radiographic studies, pulse oximetry and re-evaluation of patient's condition.  Magdalen Spatz, MSN, AGACNP-BC [place] See Amion for personal pager Critical Care   pager [phone] Use only>> No Office patient use  10/17/2020, 8:13  AM

## 2020-10-18 ENCOUNTER — Inpatient Hospital Stay (HOSPITAL_COMMUNITY): Payer: Medicare Other

## 2020-10-18 DIAGNOSIS — I613 Nontraumatic intracerebral hemorrhage in brain stem: Secondary | ICD-10-CM | POA: Diagnosis not present

## 2020-10-18 DIAGNOSIS — J9602 Acute respiratory failure with hypercapnia: Secondary | ICD-10-CM | POA: Diagnosis not present

## 2020-10-18 DIAGNOSIS — R7303 Prediabetes: Secondary | ICD-10-CM | POA: Diagnosis not present

## 2020-10-18 DIAGNOSIS — G9340 Encephalopathy, unspecified: Secondary | ICD-10-CM | POA: Diagnosis not present

## 2020-10-18 DIAGNOSIS — R509 Fever, unspecified: Secondary | ICD-10-CM | POA: Diagnosis not present

## 2020-10-18 DIAGNOSIS — I629 Nontraumatic intracranial hemorrhage, unspecified: Secondary | ICD-10-CM | POA: Diagnosis not present

## 2020-10-18 DIAGNOSIS — I480 Paroxysmal atrial fibrillation: Secondary | ICD-10-CM | POA: Diagnosis not present

## 2020-10-18 LAB — COMPREHENSIVE METABOLIC PANEL
ALT: 136 U/L — ABNORMAL HIGH (ref 0–44)
AST: 61 U/L — ABNORMAL HIGH (ref 15–41)
Albumin: 2.8 g/dL — ABNORMAL LOW (ref 3.5–5.0)
Alkaline Phosphatase: 129 U/L — ABNORMAL HIGH (ref 38–126)
Anion gap: 11 (ref 5–15)
BUN: 59 mg/dL — ABNORMAL HIGH (ref 8–23)
CO2: 26 mmol/L (ref 22–32)
Calcium: 8.5 mg/dL — ABNORMAL LOW (ref 8.9–10.3)
Chloride: 101 mmol/L (ref 98–111)
Creatinine, Ser: 1.38 mg/dL — ABNORMAL HIGH (ref 0.61–1.24)
GFR, Estimated: 57 mL/min — ABNORMAL LOW (ref >60)
Glucose, Bld: 243 mg/dL — ABNORMAL HIGH (ref 70–99)
Potassium: 3.9 mmol/L (ref 3.5–5.1)
Sodium: 138 mmol/L (ref 135–145)
Total Bilirubin: 1 mg/dL (ref 0.3–1.2)
Total Protein: 6.3 g/dL — ABNORMAL LOW (ref 6.5–8.1)

## 2020-10-18 LAB — GLUCOSE, CAPILLARY
Glucose-Capillary: 146 mg/dL — ABNORMAL HIGH (ref 70–99)
Glucose-Capillary: 164 mg/dL — ABNORMAL HIGH (ref 70–99)
Glucose-Capillary: 166 mg/dL — ABNORMAL HIGH (ref 70–99)
Glucose-Capillary: 181 mg/dL — ABNORMAL HIGH (ref 70–99)
Glucose-Capillary: 183 mg/dL — ABNORMAL HIGH (ref 70–99)
Glucose-Capillary: 241 mg/dL — ABNORMAL HIGH (ref 70–99)

## 2020-10-18 LAB — CK: Total CK: 97 U/L (ref 49–397)

## 2020-10-18 LAB — CBC
HCT: 32.4 % — ABNORMAL LOW (ref 39.0–52.0)
Hemoglobin: 10.2 g/dL — ABNORMAL LOW (ref 13.0–17.0)
MCH: 31.7 pg (ref 26.0–34.0)
MCHC: 31.5 g/dL (ref 30.0–36.0)
MCV: 100.6 fL — ABNORMAL HIGH (ref 80.0–100.0)
Platelets: 128 10*3/uL — ABNORMAL LOW (ref 150–400)
RBC: 3.22 MIL/uL — ABNORMAL LOW (ref 4.22–5.81)
RDW: 14.8 % (ref 11.5–15.5)
WBC: 11.1 10*3/uL — ABNORMAL HIGH (ref 4.0–10.5)
nRBC: 0 % (ref 0.0–0.2)

## 2020-10-18 LAB — CULTURE, RESPIRATORY W GRAM STAIN: Culture: NORMAL

## 2020-10-18 LAB — MAGNESIUM: Magnesium: 3 mg/dL — ABNORMAL HIGH (ref 1.7–2.4)

## 2020-10-18 LAB — HEPARIN LEVEL (UNFRACTIONATED)
Heparin Unfractionated: <0.1 IU/mL — ABNORMAL LOW (ref 0.30–0.70)
Heparin Unfractionated: 0.13 IU/mL — ABNORMAL LOW (ref 0.30–0.70)

## 2020-10-18 LAB — ANTINUCLEAR ANTIBODIES, IFA: ANA Ab, IFA: NEGATIVE

## 2020-10-18 LAB — FERRITIN: Ferritin: 1785 ng/mL — ABNORMAL HIGH (ref 24–336)

## 2020-10-18 LAB — SEDIMENTATION RATE: Sed Rate: 24 mm/hr — ABNORMAL HIGH (ref 0–16)

## 2020-10-18 LAB — HIV ANTIBODY (ROUTINE TESTING W REFLEX): HIV Screen 4th Generation wRfx: NONREACTIVE

## 2020-10-18 LAB — RPR: RPR Ser Ql: NONREACTIVE

## 2020-10-18 LAB — LACTATE DEHYDROGENASE: LDH: 359 U/L — ABNORMAL HIGH (ref 98–192)

## 2020-10-18 LAB — C-REACTIVE PROTEIN: CRP: 17.2 mg/dL — ABNORMAL HIGH (ref <1.0)

## 2020-10-18 MED ORDER — METOPROLOL TARTRATE 5 MG/5ML IV SOLN
2.5000 mg | Freq: Once | INTRAVENOUS | Status: AC
  Administered 2020-10-18: 2.5 mg via INTRAVENOUS
  Filled 2020-10-18: qty 5

## 2020-10-18 MED ORDER — METOPROLOL TARTRATE 5 MG/5ML IV SOLN
2.5000 mg | Freq: Four times a day (QID) | INTRAVENOUS | Status: DC | PRN
  Administered 2020-10-19 – 2020-10-20 (×2): 2.5 mg via INTRAVENOUS
  Filled 2020-10-18 (×2): qty 5

## 2020-10-18 MED ORDER — BETHANECHOL CHLORIDE 10 MG PO TABS: 50.0000 mg | ORAL_TABLET | Freq: Three times a day (TID) | ORAL | Status: DC

## 2020-10-18 MED ORDER — BETHANECHOL CHLORIDE 10 MG PO TABS
30.0000 mg | ORAL_TABLET | Freq: Three times a day (TID) | ORAL | Status: DC
  Administered 2020-10-18 – 2020-10-19 (×4): 30 mg
  Filled 2020-10-18 (×4): qty 3

## 2020-10-18 MED ORDER — CHLORHEXIDINE GLUCONATE CLOTH 2 % EX PADS
6.0000 | MEDICATED_PAD | Freq: Every day | CUTANEOUS | Status: DC
  Administered 2020-10-19 – 2020-10-28 (×10): 6 via TOPICAL

## 2020-10-18 MED ORDER — SODIUM CHLORIDE 0.9 % IV SOLN
3.0000 g | Freq: Four times a day (QID) | INTRAVENOUS | Status: AC
  Administered 2020-10-18 – 2020-10-20 (×11): 3 g via INTRAVENOUS
  Filled 2020-10-18 (×4): qty 3
  Filled 2020-10-18: qty 8
  Filled 2020-10-18 (×6): qty 3

## 2020-10-18 NOTE — Progress Notes (Signed)
Physical Therapy Treatment Patient Details Name: Walter [name] MRN: [id number] DOB: 1955-12-25 Today's Date: 10/18/2020    History of Present Illness 65 yo male presenting 3/28 with R-sided weakness and slurred speech. Imaging revealed acute hemorrhage centered within the left dorsal midbrain extending into the dorsal pons and inferomedial thalamus with intraventricular extension. EVD placement for developing hydrocephalus on 3/29. Extubated 3/30. Pt with noted decrease in EVD drainage overnight 3/31, proximal and distal clots noted, flushed, repeat CT shows significant clot in R lateral ventricle. EVD with another episode of clotting overnight 4/2, intraventricular TpA administered. Reintubated 4/4. 4/6 tracheostomy. 4/10 Doppler shows bilateral DVT. IVC filter placed. CT at chest showing bilateral (R>L) PEs on 4/19. PMH Afib, COVID infection (July 2021), and retinal detachment.    PT Comments    Pt with improvement in attention, alertness, and following one step commands today. Pt able to look in various directions on command, wiggle toes, give thumbs up, and initiate several exercises. Bed placed in chair position and worked on AAROM/PROM of all extremities in addition to unsupported sitting for truncal activation. HR elevated with and without stimulation, 128-147 afib, so out of bed mobility deferred. Will continue to progress as tolerated.    Follow Up Recommendations  SNF     Equipment Recommendations  Hospital bed;Other (comment) (hoyer lift)    Recommendations for Other Services       Precautions / Restrictions Precautions Precautions: Fall Precaution Comments: flexiseal Required Braces or Orthoses: Other Brace Other Brace: BUE resting hand splints, see schedule Restrictions Weight Bearing Restrictions: No    Mobility  Bed Mobility               General bed mobility comments: Total A for repositioning in the bed    Transfers                 General  transfer comment: Defer due to elevated HR  Ambulation/Gait                 Stairs             Wheelchair Mobility    Modified Rankin (Stroke Patients Only)       Balance                                            Cognition Arousal/Alertness: Awake/alert Behavior During Therapy: Flat affect Overall Cognitive Status: Impaired/Different from baseline Area of Impairment: Following commands;Attention                   Current Attention Level: Focused   Following Commands: Follows one step commands with increased time       General Comments: Pt alert, following commands, i.e. giving thumbs up, wiggling toes, looking to left and right, initiating certain exercises. Attempting to mouth words intermittently but unintelligble      Exercises General Exercises - Upper Extremity Shoulder Flexion: PROM;Both;10 reps;Supine Elbow Flexion: PROM;Both;10 reps;Supine Elbow Extension: PROM;Both;10 reps;Supine Digit Composite Flexion: PROM;Both;Supine;5 reps Composite Extension: PROM;Both;Supine;5 reps General Exercises - Lower Extremity Ankle Circles/Pumps: PROM;Both;15 reps;Supine Short Arc Quad: Both;10 reps;Seated;AAROM Other Exercises Other Exercises: Bed in egress position: TotalA for anterior lean, with trunk unsupported    General Comments        Pertinent Vitals/Pain Pain Assessment: Faces Faces Pain Scale: No hurt    Home Living  Prior Function            PT Goals (current goals can now be found in the care plan section) Acute Rehab PT Goals Patient Stated Goal: Unstated Potential to Achieve Goals: Fair Progress towards PT goals: Progressing toward goals    Frequency    Min 3X/week      PT Plan Current plan remains appropriate    Co-evaluation              AM-PAC PT "6 Clicks" Mobility   Outcome Measure  Help needed turning from your back to your side while in a flat bed  without using bedrails?: Total Help needed moving from lying on your back to sitting on the side of a flat bed without using bedrails?: Total Help needed moving to and from a bed to a chair (including a wheelchair)?: Total Help needed standing up from a chair using your arms (e.g., wheelchair or bedside chair)?: Total Help needed to walk in hospital room?: Total Help needed climbing 3-5 steps with a railing? : Total 6 Click Score: 6    End of Session   Activity Tolerance: Patient tolerated treatment well Patient left: in bed;with call bell/phone within reach Nurse Communication: Mobility status PT Visit Diagnosis: Hemiplegia and hemiparesis;Other abnormalities of gait and mobility (R26.89) Hemiplegia - Right/Left: Right Hemiplegia - caused by: Nontraumatic intracerebral hemorrhage     Time: 7062-3762 PT Time Calculation (min) (ACUTE ONLY): 25 min  Charges:  $Therapeutic Exercise: 23-37 mins                     Lillia Pauls, PT, DPT Acute Rehabilitation Services Pager [phone] Office [phone]    [name] 10/18/2020, 3:52 PM

## 2020-10-18 NOTE — Progress Notes (Signed)
  Speech Language Pathology Treatment: Hillary Bow Speaking valve  Patient Details Name: Walter [name] MRN: [id number] DOB: 09/11/1955 Today's Date: 10/18/2020 Time: 8144-8185 SLP Time Calculation (min) (ACUTE ONLY): 53.43 min  Assessment / Plan / Recommendation Clinical Impression  Excellent session with PMV today. [name] and daughter were present.  Pt tolerating TC. RN provided tracheal suctioning during cuff deflation.  VS were consistent before and after cuff deflation as well as with PMV in place.  Pt had good access to upper airway; intermittent removal of valve revealed no backflow through trach.    Oral care provided.  Pt able to achieve some intentional voicing with mod verbal cues for arousal and for increased volume, oral exhalation.  Pt smiling and interactive.  Spontaneous verbalizations improved by end of session, but intelligibility was limited.  PMV video was shown to family. We discussed impact of trach on speech/swallowing.  Wife/daughter were taught how to place and remove valve; they return demonstrated multiple times. We reviewed VS parameters under which valve should be removed.   As long as cuff is deflated (per RN confirmation), wife/dtr may place valve when they are in the room with [name].  Recommend that valve be used during all therapies and when pt has full supervision available.  SLP will follow for speech/language and swallowing assessments now that pt is more alert and interactive. D/W RN and family.     HPI HPI: 65 yo male presenting 3/28 with R-sided weakness and slurred speech. Imaging revealed acute hemorrhage centered within the left dorsal midbrain extending into the dorsal pons and inferomedial thalamus with intraventricular extension. EVD placement on 3/29. Extubated 3/30. Decrease in EVD drainage 3/31. Repeat CT shows significant clot in R lateral ventricle. EVD clotted 4/2 and intraventricular TpA administered. Reintubated 4/4. Tracheostomy 4/6.  PMH Afib, COVID infection (July 2021), and retinal detachment.      SLP Plan  Continue with current plan of care       Recommendations         Patient may use Passy-Muir Speech Valve: During all therapies with supervision;Caregiver trained to provide supervision PMSV Supervision: Full MD: Please consider changing trach tube to : Cuffless         Follow up Recommendations: Inpatient Rehab SLP Visit Diagnosis: Aphonia (R49.1) Plan: Continue with current plan of care       GO                Walter [name] 10/18/2020, 12:02 PM  Walter Atha L. Samson Frederic, MA CCC/SLP Acute Rehabilitation Services Office number [phone] Pager [phone]

## 2020-10-18 NOTE — Progress Notes (Addendum)
NAME:  Walter [name], MRN:  [id number], DOB:  05/10/1956, LOS: 23 ADMISSION DATE:  09/25/2020, CONSULTATION DATE:  09/25/20 REFERRING MD:  Wilford Corner,  CHIEF COMPLAINT:  Headache, r side weakness, slurred speech  History of Present Illness:  Walter [name] is a 65 yo man with hx of afib not on anticoagulation admitted with right-sided weakness, hemorrhagic stroke. Emergently intubated in the ED for airway protection  Pertinent  Medical History  Aflutter/afib (not on ac) S/p Ablation 03/2020, hx of Cardioversion 2015 Hx retinal detachment Covid infection in July 2021 treated with monoclonal antibody  Significant Hospital Events: Including procedures, antibiotic start and stop dates in addition to other pertinent events   . 3/28- Admit, intubated.  Not a candidate for TPA due to intracranial hemorrhage.  Started 3% saline . 3/29- EVD placement for developing hydrocephalus. CTA of the head was unremarkable . 3/30- Extubated. . 4/1 respiratory culture grew Moraxella, on ceftriaxone . 4/4 reintubated because of hypoxia and unresponsiveness . 4/6 tracheostomy . 4/7 still encephalopathic  . 4/8 remains encephalopathic . 4/10 Doppler shows bilateral DVT. IVC filter placed.  . 4/11 Blood cultures positive for Acinetobacter  . 4/12 Fever no worse off [place], off sedative infusions, tolerating trach collar.  . 4/13 Febrile @ 7am . 4/14 - 4/17 4/16 Afebrile Intermittently following simple commands and participated in PT, drowsy today . 4/18 febrile. Starting unasyn, sending trach asp . 4/19 T Max 101.7, WBC 12.2, ALT elevated, profoundly deconditioned . 4/20 awake and following commands. Trach aspirate w few GPC.   Interim History / Subjective:   Tmax 100.5 wBC 11.1  Afib RVR this morning, improved after 1x 2.5mg  IV metop   More awake this morning and following commands Requiring I/O caths for UOP   Objective   Blood pressure 118/74, pulse 90, temperature (!) 100.4 F (38 C), temperature  source Axillary, resp. rate 20, height 6\' 2"  (1.88 m), weight 100.1 kg, SpO2 100 %.    Vent Mode: PRVC FiO2 (%):  [40 %] 40 % Set Rate:  [20 bmp] 20 bmp Vt Set:  [650 mL] 650 mL PEEP:  [5 cmH20] 5 cmH20 Plateau Pressure:  [14 cmH20] 14 cmH20   Intake/Output Summary (Last 24 hours) at 10/18/2020 0713 Last data filed at 10/18/2020 0430 Gross per 24 hour  Intake 882.32 ml  Output 1525 ml  Net -642.68 ml   Filed Weights   10/13/20 0344 10/15/20 0400 10/16/20 0400  Weight: 106 kg 103 kg 100.1 kg    Physical Exam: General: Ill appearing older adult M, reclined in bed NAD  HENT: Walter [name] tacky mm with thick posterior pharynx secretions Trach secure.  Respiratory: symmetrical chest expansion, scattered upper rhonchi. Even and unlabored on ATC  Cardiovascular: rrr s1s2 no rgm  GI: Soft round ndnt + bowel sounds Extremities: BLE pitting edema. No acute joint deformity. No cyanosis or clubbing  Neuro: Awake, following commands BUE (L>R) BLE, sticks tongue out.   Labs/imaging that I havepersonally reviewed  (right click and "Reselect all SmartList Selections" daily)   4/8 BCx acinetobacter x1 4/18 trach asp>>few yeast, few GP cocci>> 10/17/2020>> CT Head Continuing diminishing density of blood along the ventriculostomy track in the right frontal region. No evidence of any new or increased bleeding. Diminishing dependent intraventricular blood in the lateral ventricles. Very small amount of subarachnoid blood and possibly subdural blood at the ventriculostomy entry site appears similar. 2. Small cortical infarctions of the left frontoparietal vertex are barely appreciable. No evidence of new or  increased infarction. 3. Ventricular size remains stable without evidence of ongoing aqueductal obstruction. 4. 15 mm intra-axial cystic space in the left side of the upper pons and midbrain at the site of the previous hematoma. This may communicate with the fourth ventricle.  Resolved Hospital  Problem list   AKI  Hypernatremia Moraxella catarrhalis pneumonia s/p antibiotics Thrombocytopenia  Assessment & Plan:   Acute midbrain/dorsal pons/thalamic IPH Acute obstructive hydrocephalus s/p EVD Acute encephalopathy due to IPH, delirium, sedation, possible infection  P -neuro primary -amantadine BID -Minimize sedating drugs  -PRN oxycodone for pain  Acute hypoxic respiratory failure requiring mechanical ventilation, s/p tracheostomy  Moraxella catarrhalis PNA (has been tx)  -having some incr tracheal secretions 4/19, diuresed 4/18 for 1900 cc's P -cont unasyn, follow trach aspirate and dc if no source -Wean to Trach collar, ATC as tolerated -routine trach care  -pulm hygiene   Fever-- central vs infectious vs r/t dvt / PE  - had a 7d course of cefepime for acinetobacter -was afebrile for 72 hrs off cooling blanket and IV sedation. Stopped bromocriptine and buspirone.  -seems late in course for central fever P - sent trach asp 4/18 and started unasyn - ID following - PRN APAP - Trend WBC and fever curve  DVT, bilateral PE, bilateral  P -IVC filter (plan was until 4/24) -hep gtt   Afib RVR  P -cont q6hr dilt -will add PRN metop if RVR recurs  -hep gtt   Urinary retention P -urecholine  -voiding trial   Transaminitis, mild P -PRN LFTs   Hyperglycemia  P -Lantus 6 units qD + rSSI q4hr+ novolog 4units q4hr    Best Practice (right click and "Reselect all SmartList Selections" daily)   Diet:  Tube Feed  on fiber for diarrhea  Pain/Anxiety/Delirium protocol (if indicated): RASS goal 0. PRN oxy fent  VAP protocol (if indicated): Yes DVT prophylaxis: Systemic AC heparin - IVC filter in place. Heparin gtt    GI prophylaxis: PPI Glucose control:  SSI Yes, basal, meal coverage  Central venous access:  N/A Arterial line:  N/A Foley:  N/A  Mobility:  bed rest  PT consulted: Yes Code Status:  full code Prognosis: Guarded Last date of  multidisciplinary goals of care discussion 4/12 with primary team   CRITICAL CARE Performed by: Walter [name]   Total critical care time: 48 minutes  Critical care time was exclusive of separately billable procedures and treating other patients.  Critical care was necessary to treat or prevent imminent or life-threatening deterioration.  Critical care was time spent personally by me on the following activities: development of treatment plan with patient and/or surrogate as well as nursing, discussions with consultants, evaluation of patient's response to treatment, examination of patient, obtaining history from patient or surrogate, ordering and performing treatments and interventions, ordering and review of laboratory studies, ordering and review of radiographic studies, pulse oximetry and re-evaluation of patient's condition.  [name] MSN, AGACNP-BC [facility] [phone] If no answer, [phone] 10/18/2020, 7:13 AM

## 2020-10-18 NOTE — Progress Notes (Signed)
ANTICOAGULATION CONSULT NOTE   Pharmacy Consult for heparin Indication: DVT + PE   Allergies  Allergen Reactions  . Keppra [Levetiracetam] Swelling    Patient experienced angioedema post inpatient keppra dose  . Latex Itching    Skin turns real red    Patient Measurements: Height: 6\' 2"  (188 cm) Weight: 100.1 kg (220 lb 10.9 oz) IBW/kg (Calculated) : 82.2 Heparin Dosing Weight: 100.1 kg   Vital Signs: Temp: 100.4 F (38 C) (04/20 0400) Temp Source: Axillary (04/20 0400) BP: 126/78 (04/20 0800) Pulse Rate: 85 (04/20 0800)  Labs: Recent Labs    10/16/20 0815 10/17/20 0642 10/17/20 1941 10/18/20 0734  HGB 10.9* 11.0*  --  10.2*  HCT 34.1* 35.1*  --  32.4*  PLT 201 148*  --  128*  HEPARINUNFRC  --   --  <0.10* <0.10*  CREATININE 0.95 1.14  --  1.38*  CKTOTAL  --   --   --  97    Estimated Creatinine Clearance: 67.5 mL/min (A) (by C-G formula based on SCr of 1.38 mg/dL (H)).    Assessment: 75 YOM who presented to the hospital with an intraparenchymal hemorrhage later found to have a DVT. CT chest on 4/29 with new bilateral PE. Given his recent ICH, he received an IVC filter but now starting therapeutic anticoag per neurology with NO BOLUS  H/H low stable, Plt down. SCr up slightly.   AM heparin level is undetectable on 1400 units/hr. No s/s of overt bleeding noted   Goal of Therapy:  Heparin level 0.3-0.5 units/ml Monitor platelets by anticoagulation protocol: Yes   Plan:  Increase heparin to 1600 units / hr. No bolus  Next heparin level in 8 hours  5/29, PharmD., BCPS, BCCCP Clinical Pharmacist Please refer to [facility] for unit-specific pharmacist

## 2020-10-18 NOTE — Progress Notes (Signed)
Subjective:  Not responding to me when I came by   Antibiotics:  Anti-infectives (From admission, onward)   Start     Dose/Rate Route Frequency Ordered Stop   10/18/20 1100  Ampicillin-Sulbactam (UNASYN) 3 g in sodium chloride 0.9 % 100 mL IVPB        3 g 200 mL/hr over 30 Minutes Intravenous Every 6 hours 10/18/20 0931 10/21/20 1059   10/16/20 1100  Ampicillin-Sulbactam (UNASYN) 3 g in sodium chloride 0.9 % 100 mL IVPB  Status:  Discontinued        3 g 200 mL/hr over 30 Minutes Intravenous Every 6 hours 10/16/20 0948 10/18/20 0859   10/07/20 0000  vancomycin (VANCOREADY) IVPB 1500 mg/300 mL  Status:  Discontinued        1,500 mg 150 mL/hr over 120 Minutes Intravenous Every 12 hours 10/06/20 1000 10/07/20 1406   10/06/20 1200  ceFEPIme (MAXIPIME) 2 g in sodium chloride 0.9 % 100 mL IVPB        2 g 200 mL/hr over 30 Minutes Intravenous Every 8 hours 10/06/20 1000 10/13/20 2225   10/06/20 1100  vancomycin (VANCOREADY) IVPB 2000 mg/400 mL        2,000 mg 200 mL/hr over 120 Minutes Intravenous  Once 10/06/20 1000 10/06/20 1314   10/02/20 0900  Ampicillin-Sulbactam (UNASYN) 3 g in sodium chloride 0.9 % 100 mL IVPB  Status:  Discontinued        3 g 200 mL/hr over 30 Minutes Intravenous Every 6 hours 10/02/20 0812 10/06/20 0954   09/29/20 1130  cefTRIAXone (ROCEPHIN) 2 g in sodium chloride 0.9 % 100 mL IVPB  Status:  Discontinued        2 g 200 mL/hr over 30 Minutes Intravenous Every 24 hours 09/29/20 1037 10/02/20 0812      Medications: Scheduled Meds: . amantadine  200 mg Per Tube BID  . bethanechol  30 mg Per Tube TID  . chlorhexidine gluconate (MEDLINE KIT)  15 mL Mouth Rinse BID  . Chlorhexidine Gluconate Cloth  6 each Topical Daily  . diltiazem  30 mg Per Tube Q6H  . feeding supplement (PROSource TF)  45 mL Per Tube BID  . fiber  1 packet Per Tube BID  . insulin aspart  0-20 Units Subcutaneous Q4H  . insulin aspart  4 Units Subcutaneous Q4H  . insulin glargine   6 Units Subcutaneous Daily  . mouth rinse  15 mL Mouth Rinse 10 times per day  . ondansetron (ZOFRAN) IV  4 mg Intravenous Q6H  . pantoprazole sodium  40 mg Per Tube QHS  . sodium chloride flush  10-40 mL Intracatheter Q12H   Continuous Infusions: . sodium chloride Stopped (09/30/20 1324)  . ampicillin-sulbactam (UNASYN) IV    . feeding supplement (VITAL 1.5 CAL) 65 mL/hr at 10/16/20 1300  . heparin 1,400 Units/hr (10/18/20 0800)   PRN Meds:.sodium chloride, acetaminophen **OR** acetaminophen (TYLENOL) oral liquid 160 mg/5 mL **OR** acetaminophen, fentaNYL (SUBLIMAZE) injection, fentaNYL (SUBLIMAZE) injection, guaiFENesin-dextromethorphan, hydrALAZINE, metoprolol tartrate, oxyCODONE, sodium chloride flush    Objective: Weight change:   Intake/Output Summary (Last 24 hours) at 10/18/2020 1048 Last data filed at 10/18/2020 0800 Gross per 24 hour  Intake 2107.97 ml  Output 1400 ml  Net 707.97 ml   Blood pressure 126/78, pulse 85, temperature (!) 100.4 F (38 C), temperature source Axillary, resp. rate (!) 33, height '6\' 2"'  (1.88 m), weight 100.1 kg, SpO2 99 %. Temp:  [99.5 F (37.5  C)-100.5 F (38.1 C)] 100.4 F (38 C) (04/20 0400) Pulse Rate:  [73-141] 85 (04/20 0800) Resp:  [11-36] 33 (04/20 0800) BP: (99-141)/(65-82) 126/78 (04/20 0800) SpO2:  [95 %-100 %] 99 % (04/20 0800) FiO2 (%):  [28 %-40 %] 28 % (04/20 0747)  Physical Exam: Physical Exam Constitutional:      Appearance: He is well-developed.  HENT:     Head: Normocephalic and atraumatic.     Nose: Nose normal.  Cardiovascular:     Rate and Rhythm: Normal rate and regular rhythm.     Heart sounds: No murmur heard. No friction rub. No gallop.   Pulmonary:     Effort: Pulmonary effort is normal. No respiratory distress.     Breath sounds: Rhonchi present.  Abdominal:     General: There is no distension.     Palpations: Abdomen is soft.  Musculoskeletal:        General: No swelling. Normal range of motion.      Cervical back: Normal range of motion and neck supple.  Skin:    General: Skin is warm and dry.     Findings: No erythema or rash.  Neurological:     Mental Status: He is lethargic.      CBC:    BMET Recent Labs    10/17/20 0642 10/18/20 0734  NA 140 138  K 4.2 3.9  CL 103 101  CO2 27 26  GLUCOSE 216* 243*  BUN 42* 59*  CREATININE 1.14 1.38*  CALCIUM 8.9 8.5*     Liver Panel  Recent Labs    10/17/20 0642 10/18/20 0734  PROT 6.6 6.3*  ALBUMIN 2.8* 2.8*  AST 56* 61*  ALT 126* 136*  ALKPHOS 129* 129*  BILITOT 0.8 1.0  BILIDIR 0.2  --   IBILI 0.6  --        Sedimentation Rate Recent Labs    10/18/20 0734  ESRSEDRATE 24*   C-Reactive Protein Recent Labs    10/18/20 0734  CRP 17.2*    Micro Results: Recent Results (from the past 720 hour(s))  Resp Panel by RT-PCR (Flu A&B, Covid) Nasopharyngeal Swab     Status: None   Collection Time: 09/25/20  3:37 AM   Specimen: Nasopharyngeal Swab; Nasopharyngeal(NP) swabs in vial transport medium  Result Value Ref Range Status   SARS Coronavirus 2 by RT PCR NEGATIVE NEGATIVE Final    Comment: (NOTE) SARS-CoV-2 target nucleic acids are NOT DETECTED.  The SARS-CoV-2 RNA is generally detectable in upper respiratory specimens during the acute phase of infection. The lowest concentration of SARS-CoV-2 viral copies this assay can detect is 138 copies/mL. A negative result does not preclude SARS-Cov-2 infection and should not be used as the sole basis for treatment or other patient management decisions. A negative result may occur with  improper specimen collection/handling, submission of specimen other than nasopharyngeal swab, presence of viral mutation(s) within the areas targeted by this assay, and inadequate number of viral copies(<138 copies/mL). A negative result must be combined with clinical observations, patient history, and epidemiological information. The expected result is Negative.  Fact Sheet  for Patients:  EntrepreneurPulse.com.au  Fact Sheet for Healthcare Providers:  IncredibleEmployment.be  This test is no t yet approved or cleared by the [place] FDA and  has been authorized for detection and/or diagnosis of SARS-CoV-2 by FDA under an Emergency Use Authorization (EUA). This EUA will remain  in effect (meaning this test can be used) for the duration of the COVID-19 declaration  under Section 564(b)(1) of the Act, 21 U.S.C.section 360bbb-3(b)(1), unless the authorization is terminated  or revoked sooner.       Influenza A by PCR NEGATIVE NEGATIVE Final   Influenza B by PCR NEGATIVE NEGATIVE Final    Comment: (NOTE) The Xpert Xpress SARS-CoV-2/FLU/RSV plus assay is intended as an aid in the diagnosis of influenza from Nasopharyngeal swab specimens and should not be used as a sole basis for treatment. Nasal washings and aspirates are unacceptable for Xpert Xpress SARS-CoV-2/FLU/RSV testing.  Fact Sheet for Patients: EntrepreneurPulse.com.au  Fact Sheet for Healthcare Providers: IncredibleEmployment.be  This test is not yet approved or cleared by the [place] FDA and has been authorized for detection and/or diagnosis of SARS-CoV-2 by FDA under an Emergency Use Authorization (EUA). This EUA will remain in effect (meaning this test can be used) for the duration of the COVID-19 declaration under Section 564(b)(1) of the Act, 21 U.S.C. section 360bbb-3(b)(1), unless the authorization is terminated or revoked.  Performed at [facility], [place] [address] [place], [place] [address]   MRSA PCR Screening     Status: None   Collection Time: 09/25/20  5:18 AM   Specimen: Nasal Mucosa; Nasopharyngeal  Result Value Ref Range Status   MRSA by PCR NEGATIVE NEGATIVE Final    Comment:        The GeneXpert MRSA Assay (FDA approved for NASAL specimens only), is one component of  a comprehensive MRSA colonization surveillance program. It is not intended to diagnose MRSA infection nor to guide or monitor treatment for MRSA infections. Performed at [facility], [place] [address], [place], [place] [address]   Culture, Respiratory w Gram Stain     Status: None   Collection Time: 09/27/20  9:20 AM   Specimen: Tracheal Aspirate; Respiratory  Result Value Ref Range Status   Specimen Description TRACHEAL ASPIRATE  Final   Special Requests NONE  Final   Gram Stain   Final    MODERATE WBC PRESENT, PREDOMINANTLY PMN MODERATE GRAM POSITIVE COCCI IN PAIRS RARE GRAM NEGATIVE COCCOBACILLI RARE GRAM POSITIVE RODS    Culture   Final    ABUNDANT MORAXELLA CATARRHALIS(BRANHAMELLA) BETA LACTAMASE POSITIVE Performed at [facility], [place] [address], [place], [place] [address]    Report Status 09/29/2020 FINAL  Final  Culture, Respiratory w Gram Stain     Status: None   Collection Time: 10/03/20  3:06 AM   Specimen: Tracheal Aspirate; Respiratory  Result Value Ref Range Status   Specimen Description TRACHEAL ASPIRATE  Final   Special Requests NONE  Final   Gram Stain   Final    RARE WBC PRESENT,BOTH PMN AND MONONUCLEAR NO ORGANISMS SEEN    Culture   Final    RARE Normal respiratory flora-no Staph aureus or Pseudomonas seen Performed at [facility], [address] [place], [place] [address]    Report Status 10/05/2020 FINAL  Final  Culture, blood (routine x 2)     Status: None   Collection Time: 10/06/20 10:55 AM   Specimen: BLOOD RIGHT HAND  Result Value Ref Range Status   Specimen Description BLOOD RIGHT HAND  Final   Special Requests   Final    BOTTLES DRAWN AEROBIC ONLY Blood Culture results may not be optimal due to an inadequate volume of blood received in culture bottles   Culture   Final    NO GROWTH 5 DAYS Performed at [facility], [place] [address],  [place], [place] [address]    Report Status 10/11/2020 FINAL  Final  Culture,  blood (routine x 2)     Status: Abnormal   Collection Time: 10/06/20 11:08 AM   Specimen: BLOOD  Result Value Ref Range Status   Specimen Description BLOOD FOOT  Final   Special Requests   Final    BOTTLES DRAWN AEROBIC ONLY Blood Culture results may not be optimal due to an inadequate volume of blood received in culture bottles   Culture  Setup Time   Final    GRAM NEGATIVE COCCOBACILLI AEROBIC BOTTLE ONLY Organism ID to follow CRITICAL RESULT CALLED TO, READ BACK BY AND VERIFIED WITH: M. [place], AT 1222 10/07/20 [place] Performed at [facility], [place] [address], [place], [place] [address]    Culture ACINETOBACTER SPECIES (A)  Final   Report Status 10/10/2020 FINAL  Final   Organism ID, Bacteria ACINETOBACTER SPECIES  Final      Susceptibility   Acinetobacter species - MIC*    CEFTAZIDIME 2 SENSITIVE Sensitive     CIPROFLOXACIN 0.5 SENSITIVE Sensitive     GENTAMICIN <=1 SENSITIVE Sensitive     IMIPENEM <=0.25 SENSITIVE Sensitive     PIP/TAZO 32 INTERMEDIATE Intermediate     TRIMETH/SULFA <=20 SENSITIVE Sensitive     AMPICILLIN/SULBACTAM <=2 SENSITIVE Sensitive     * ACINETOBACTER SPECIES  Blood Culture ID Panel (Reflexed)     Status: None   Collection Time: 10/06/20 11:08 AM  Result Value Ref Range Status   Enterococcus faecalis NOT DETECTED NOT DETECTED Final   Enterococcus Faecium NOT DETECTED NOT DETECTED Final   Listeria monocytogenes NOT DETECTED NOT DETECTED Final   Staphylococcus species NOT DETECTED NOT DETECTED Final   Staphylococcus aureus (BCID) NOT DETECTED NOT DETECTED Final   Staphylococcus epidermidis NOT DETECTED NOT DETECTED Final   Staphylococcus lugdunensis NOT DETECTED NOT DETECTED Final   Streptococcus species NOT DETECTED NOT DETECTED Final   Streptococcus agalactiae NOT DETECTED NOT DETECTED Final   Streptococcus pneumoniae NOT DETECTED NOT DETECTED Final   Streptococcus pyogenes NOT DETECTED NOT DETECTED Final   A.calcoaceticus-baumannii  NOT DETECTED NOT DETECTED Final   Bacteroides fragilis NOT DETECTED NOT DETECTED Final   Enterobacterales NOT DETECTED NOT DETECTED Final   Enterobacter cloacae complex NOT DETECTED NOT DETECTED Final   Escherichia coli NOT DETECTED NOT DETECTED Final   Klebsiella aerogenes NOT DETECTED NOT DETECTED Final   Klebsiella oxytoca NOT DETECTED NOT DETECTED Final   Klebsiella pneumoniae NOT DETECTED NOT DETECTED Final   Proteus species NOT DETECTED NOT DETECTED Final   Salmonella species NOT DETECTED NOT DETECTED Final   Serratia marcescens NOT DETECTED NOT DETECTED Final   Haemophilus influenzae NOT DETECTED NOT DETECTED Final   Neisseria meningitidis NOT DETECTED NOT DETECTED Final   Pseudomonas aeruginosa NOT DETECTED NOT DETECTED Final   Stenotrophomonas maltophilia NOT DETECTED NOT DETECTED Final   Candida albicans NOT DETECTED NOT DETECTED Final   Candida auris NOT DETECTED NOT DETECTED Final   Candida glabrata NOT DETECTED NOT DETECTED Final   Candida krusei NOT DETECTED NOT DETECTED Final   Candida parapsilosis NOT DETECTED NOT DETECTED Final   Candida tropicalis NOT DETECTED NOT DETECTED Final   Cryptococcus neoformans/gattii NOT DETECTED NOT DETECTED Final    Comment: Performed at [facility], [place] [address] [place], [place] [address]  Culture, Urine     Status: None   Collection Time: 10/06/20  1:37 PM   Specimen: Urine, Clean Catch  Result  Value Ref Range Status   Specimen Description URINE, CLEAN CATCH  Final   Special Requests NONE  Final   Culture   Final    NO GROWTH Performed at [facility], [address] [place], [place] [address]    Report Status 10/07/2020 FINAL  Final  Culture, Respiratory w Gram Stain     Status: None   Collection Time: 10/06/20 11:00 PM   Specimen: Tracheal Aspirate; Respiratory  Result Value Ref Range Status   Specimen Description TRACHEAL ASPIRATE  Final   Special Requests NONE  Final   Gram Stain   Final    MODERATE WBC  PRESENT, PREDOMINANTLY PMN RARE YEAST    Culture   Final    RARE CANDIDA ALBICANS NO STAPHYLOCOCCUS AUREUS ISOLATED No Pseudomonas species isolated Performed at [facility], [address], [place], [place] [address]    Report Status 10/10/2020 FINAL  Final  C Difficile Quick Screen (NO PCR Reflex)     Status: None   Collection Time: 10/08/20 11:07 AM   Specimen: STOOL  Result Value Ref Range Status   C Diff antigen NEGATIVE NEGATIVE Final   C Diff toxin NEGATIVE NEGATIVE Final   C Diff interpretation No C. difficile detected.  Final    Comment: Performed at [facility], [place] [address], [place], [place] [address]  Culture, Respiratory w Gram Stain     Status: None (Preliminary result)   Collection Time: 10/16/20 10:56 AM   Specimen: Tracheal Aspirate; Respiratory  Result Value Ref Range Status   Specimen Description TRACHEAL ASPIRATE  Final   Special Requests NONE  Final   Gram Stain   Final    RARE SQUAMOUS EPITHELIAL CELLS PRESENT MODERATE WBC PRESENT, PREDOMINANTLY PMN FEW YEAST FEW GRAM POSITIVE COCCI    Culture   Final    CULTURE REINCUBATED FOR BETTER GROWTH Performed at [facility], [place] [address] [place], [place] [address]    Report Status PENDING  Incomplete    Studies/Results: CT HEAD WO CONTRAST  Result Date: 10/17/2020 CLINICAL DATA:  Follow-up intracranial hemorrhage EXAM: CT HEAD WITHOUT CONTRAST TECHNIQUE: Contiguous axial images were obtained from the base of the skull through the vertex without intravenous contrast. COMPARISON:  10/11/2020 FINDINGS: Brain: Intraparenchymal hemorrhage in the left side the upper pons and midbrain no longer shows any hyperdense components. There is an intra-axial cystic space measuring up to 15 mm in diameter, which may communicate with the fourth ventricle. No focal cerebellar abnormality is seen. Continuing diminishing density of blood along the ventriculostomy track in the right frontal region. Persistent  brain edema surrounding that. Small amount of subarachnoid blood possibly subdural blood the ventriculostomy entry site appears similar. No evidence of any new or increased bleeding. Small cortical infarctions of the left frontoparietal vertex are barely appreciable. No evidence of new brain infarction. Ventricular size remains stable without evidence of ongoing aqueductal obstruction. Previously seen hyperdense blood dependent within the occipital horns is diminishing. Vascular: No vascular finding. Skull: Normal other than the right frontal burr hole. Sinuses/Orbits: Paranasal sinuses are clear. Orbits are normal. No fluid in the middle ears. Small mastoid effusions. Other: None IMPRESSION: 1. Continuing diminishing density of blood along the ventriculostomy track in the right frontal region. No evidence of any new or increased bleeding. Diminishing dependent intraventricular blood in the lateral ventricles. Very small amount of subarachnoid blood and possibly subdural blood at the ventriculostomy entry site appears similar. 2. Small cortical infarctions of  the left frontoparietal vertex are barely appreciable. No evidence of new or increased infarction. 3. Ventricular size remains stable without evidence of ongoing aqueductal obstruction. 4. 15 mm intra-axial cystic space in the left side of the upper pons and midbrain at the site of the previous hematoma. This may communicate with the fourth ventricle. Electronically Signed   By: Nelson Chimes M.D.   On: 10/17/2020 07:35   CT CHEST W CONTRAST  Result Date: 10/17/2020 CLINICAL DATA:  Fever of unknown origin. EXAM: CT CHEST, ABDOMEN, AND PELVIS WITH CONTRAST TECHNIQUE: Multidetector CT imaging of the chest, abdomen and pelvis was performed following the standard protocol during bolus administration of intravenous contrast. CONTRAST:  15m OMNIPAQUE IOHEXOL 300 MG/ML  SOLN COMPARISON:  Plain film 10/17/2020.  No prior CTs. FINDINGS: CT CHEST FINDINGS  Cardiovascular: Aortic atherosclerosis. Normal heart size, without pericardial effusion. Bilateral pulmonary emboli are identified on this non dedicated exam. Example in the central right pulmonary artery on 28/3. Branch pulmonary arteries to both upper and lower lobes on 32/3. Within the left upper lobe pulmonary artery branch on 20/3. Mediastinum/Nodes: No mediastinal or hilar adenopathy. Lungs/Pleura: No pleural fluid. Endotracheal tube terminates appropriately. Areas of mild peripheral predominant airspace and ground-glass are indeterminate. Example at the left apex on 22/5 and right lower lobe on 58/5. More dependent bibasilar airspace opacities are likely due to atelectasis. Musculoskeletal: No acute osseous abnormality. CT ABDOMEN PELVIS FINDINGS Hepatobiliary: Mild hepatomegaly at 18.4 cm craniocaudal. Normal gallbladder, without biliary ductal dilatation. Pancreas: Normal, without mass or ductal dilatation. Spleen: Normal in size, without focal abnormality. Adrenals/Urinary Tract: Normal adrenal glands. Bilateral renal sinus cysts, without hydronephrosis. Foley catheter within the urinary bladder. Stomach/Bowel: Nasogastric tube terminating at the gastric antrum. Rectal catheter in place. Scattered colonic diverticula. Normal terminal ileum and appendix. Normal small bowel. Vascular/Lymphatic: Aortic atherosclerosis. IVC filter is appropriately positioned. No abdominopelvic adenopathy. Reproductive: Mild prostatomegaly. Other: No significant free fluid. No free intraperitoneal air. Mild pelvic anasarca. tiny bilateral fat containing inguinal hernias. Musculoskeletal: Degenerative partial fusion of bilateral sacroiliac joints in. Degenerate disc disease at the lumbosacral junction. IMPRESSION: 1. Bilateral, large volume right greater than left pulmonary emboli on this nondedicated exam. No evidence of right heart strain. 2. Mild peripheral ground-glass opacities, favoring atelectasis or infarct. Atypical  infection felt less likely. 3. Otherwise, no explanation for fever. 4.  Aortic Atherosclerosis (ICD10-I70.0). 5. Prostatomegaly. These results will be called to the ordering clinician or representative by the Radiologist Assistant, and communication documented in the PACS or [employer] Electronically Signed   By: KAbigail MiyamotoM.D.   On: 10/17/2020 14:43   CT ABDOMEN PELVIS W CONTRAST  Result Date: 10/17/2020 CLINICAL DATA:  Fever of unknown origin. EXAM: CT CHEST, ABDOMEN, AND PELVIS WITH CONTRAST TECHNIQUE: Multidetector CT imaging of the chest, abdomen and pelvis was performed following the standard protocol during bolus administration of intravenous contrast. CONTRAST:  1093mOMNIPAQUE IOHEXOL 300 MG/ML  SOLN COMPARISON:  Plain film 10/17/2020.  No prior CTs. FINDINGS: CT CHEST FINDINGS Cardiovascular: Aortic atherosclerosis. Normal heart size, without pericardial effusion. Bilateral pulmonary emboli are identified on this non dedicated exam. Example in the central right pulmonary artery on 28/3. Branch pulmonary arteries to both upper and lower lobes on 32/3. Within the left upper lobe pulmonary artery branch on 20/3. Mediastinum/Nodes: No mediastinal or hilar adenopathy. Lungs/Pleura: No pleural fluid. Endotracheal tube terminates appropriately. Areas of mild peripheral predominant airspace and ground-glass are indeterminate. Example at the left apex on 22/5 and right lower lobe  on 58/5. More dependent bibasilar airspace opacities are likely due to atelectasis. Musculoskeletal: No acute osseous abnormality. CT ABDOMEN PELVIS FINDINGS Hepatobiliary: Mild hepatomegaly at 18.4 cm craniocaudal. Normal gallbladder, without biliary ductal dilatation. Pancreas: Normal, without mass or ductal dilatation. Spleen: Normal in size, without focal abnormality. Adrenals/Urinary Tract: Normal adrenal glands. Bilateral renal sinus cysts, without hydronephrosis. Foley catheter within the urinary bladder.  Stomach/Bowel: Nasogastric tube terminating at the gastric antrum. Rectal catheter in place. Scattered colonic diverticula. Normal terminal ileum and appendix. Normal small bowel. Vascular/Lymphatic: Aortic atherosclerosis. IVC filter is appropriately positioned. No abdominopelvic adenopathy. Reproductive: Mild prostatomegaly. Other: No significant free fluid. No free intraperitoneal air. Mild pelvic anasarca. tiny bilateral fat containing inguinal hernias. Musculoskeletal: Degenerative partial fusion of bilateral sacroiliac joints in. Degenerate disc disease at the lumbosacral junction. IMPRESSION: 1. Bilateral, large volume right greater than left pulmonary emboli on this nondedicated exam. No evidence of right heart strain. 2. Mild peripheral ground-glass opacities, favoring atelectasis or infarct. Atypical infection felt less likely. 3. Otherwise, no explanation for fever. 4.  Aortic Atherosclerosis (ICD10-I70.0). 5. Prostatomegaly. These results will be called to the ordering clinician or representative by the Radiologist Assistant, and communication documented in the PACS or [employer]. Electronically Signed   By: Abigail Miyamoto M.D.   On: 10/17/2020 14:43   DG CHEST PORT 1 VIEW  Result Date: 10/18/2020 CLINICAL DATA:  Fever. EXAM: PORTABLE CHEST 1 VIEW COMPARISON:  CT 10/17/2020.  Chest x-ray 10/17/2020. FINDINGS: Tracheostomy tube and feeding tube are in stable position. Heart size normal. Low lung volumes. Mild right base subsegment atelectasis/infiltrate. No pleural effusion or pneumothorax. IVC filter noted over the right upper abdomen. Degenerative change thoracic spine. IMPRESSION: 1. Lines and tubes stable position. 2. Mild right base subsegmental atelectasis/infiltrate. Electronically Signed   By: Marcello Moores  Register   On: 10/18/2020 06:04   DG CHEST PORT 1 VIEW  Result Date: 10/17/2020 CLINICAL DATA:  Fever. EXAM: PORTABLE CHEST 1 VIEW COMPARISON:  10/06/2020 FINDINGS: A tracheostomy tube  remains in place and overlies the airway. A feeding tube courses into the abdomen with tip not imaged. The cardiomediastinal silhouette is unchanged with normal heart size. Lung aeration has improved with minimal residual opacities in the left greater than right lung bases. No edema, sizable pleural effusion, pneumothorax is identified. IMPRESSION: Improved lung aeration with minimal residual bibasilar opacities likely reflecting atelectasis. Electronically Signed   By: Logan Bores M.D.   On: 10/17/2020 10:33      Assessment/Plan:  INTERVAL HISTORY: CT scans showed multiple pulmonary emboli   Active Problems:   ICH (intracerebral hemorrhage) (HCC)   Intracranial hemorrhage (HCC)   Atrial fibrillation ([place])   Prediabetes   Leukocytosis   Acute blood loss anemia   Hypernatremia    Walter [name] is a 65 y.o. male with  history of atrial fibrillation prior ablation in 2021 who was not on anticoagulation was admitted with profound right-sided weakness and found to have a hemorrhagic stroke on September 25, 2020.  He required placement of extraventricular drain   He has had fevers nearly the entire time he has been here and also been on antibiotics for most of that time.  As far as targeted therapy she has been treated for Moraxella (iin lungs) and Acinetobactr (grew in 1/2 blood cultures)  He has had a CT chest abdomen pelvis performed yesterday.  There was nothing seen in terms of a clear-cut antibiotic target in the abdomen and pelvis.  He does have extensive pulmonary emboli  in the lungs and recently began anticoagulation in the context of DVTs  My understanding is that the pulmonary critical care wants to continue the Unasyn that was started to cover for pathology in his lungs.  I agree his secretions have been fairly impressive his CT of the lungs is fairly underwhelming.  Would not oppose giving him 5 days of treatment but then would stop because he is getting a lot of antibiotic  selective pressure for multidrug-resistant organisms and we really do not see a clear-cut target for antibiotics at this point in time.  The remainder of his FUO work-up continues and LDH slightly elevated hepatitis panel negative sed rate 24 CRP elevated.      LOS: 23 days   Alcide Evener 10/18/2020, 10:48 AM

## 2020-10-18 NOTE — Progress Notes (Signed)
ANTICOAGULATION CONSULT NOTE   Pharmacy Consult for heparin Indication: DVT + PE   Allergies  Allergen Reactions  . Keppra [Levetiracetam] Swelling    Patient experienced angioedema post inpatient keppra dose  . Latex Itching    Skin turns real red    Patient Measurements: Height: 6\' 2"  (188 cm) Weight: 100.1 kg (220 lb 10.9 oz) IBW/kg (Calculated) : 82.2 Heparin Dosing Weight: 100.1 kg   Vital Signs: Temp: 99.8 F (37.7 C) (04/20 1200) Temp Source: Axillary (04/20 1200) BP: 115/70 (04/20 1548) Pulse Rate: 92 (04/20 1200)  Labs: Recent Labs    10/16/20 0815 10/17/20 0642 10/17/20 1941 10/18/20 0734 10/18/20 1535  HGB 10.9* 11.0*  --  10.2*  --   HCT 34.1* 35.1*  --  32.4*  --   PLT 201 148*  --  128*  --   HEPARINUNFRC  --   --  <0.10* <0.10* 0.13*  CREATININE 0.95 1.14  --  1.38*  --   CKTOTAL  --   --   --  97  --     Estimated Creatinine Clearance: 67.5 mL/min (A) (by C-G formula based on SCr of 1.38 mg/dL (H)).    Assessment: 46 YOM who presented to the hospital with an intraparenchymal hemorrhage later found to have a DVT. CT chest on 4/29 with new bilateral PE. Given his recent ICH, he received an IVC filter but now starting therapeutic anticoag per neurology with NO BOLUS  H/H low stable, Plt down. SCr up slightly.   heparin level is subtherapeutic at 0.13 on 1600 units/hr. No s/s of overt bleeding noted   Goal of Therapy:  Heparin level 0.3-0.5 units/ml Monitor platelets by anticoagulation protocol: Yes   Plan:  Increase heparin to 1800 units / hr. No bolus  Next heparin level in 8 hours  5/29, PharmD, [facility] Clinical Pharmacist Please see AMION for all Pharmacists' Contact Phone Numbers 10/18/2020, 4:19 PM

## 2020-10-19 DIAGNOSIS — J9602 Acute respiratory failure with hypercapnia: Secondary | ICD-10-CM | POA: Diagnosis not present

## 2020-10-19 DIAGNOSIS — R7303 Prediabetes: Secondary | ICD-10-CM | POA: Diagnosis not present

## 2020-10-19 DIAGNOSIS — G9340 Encephalopathy, unspecified: Secondary | ICD-10-CM | POA: Diagnosis not present

## 2020-10-19 DIAGNOSIS — R509 Fever, unspecified: Secondary | ICD-10-CM | POA: Diagnosis not present

## 2020-10-19 DIAGNOSIS — I480 Paroxysmal atrial fibrillation: Secondary | ICD-10-CM | POA: Diagnosis not present

## 2020-10-19 DIAGNOSIS — I613 Nontraumatic intracerebral hemorrhage in brain stem: Secondary | ICD-10-CM | POA: Diagnosis not present

## 2020-10-19 DIAGNOSIS — I629 Nontraumatic intracranial hemorrhage, unspecified: Secondary | ICD-10-CM | POA: Diagnosis not present

## 2020-10-19 LAB — CBC
HCT: 31 % — ABNORMAL LOW (ref 39.0–52.0)
Hemoglobin: 9.7 g/dL — ABNORMAL LOW (ref 13.0–17.0)
MCH: 31.3 pg (ref 26.0–34.0)
MCHC: 31.3 g/dL (ref 30.0–36.0)
MCV: 100 fL (ref 80.0–100.0)
Platelets: 140 10*3/uL — ABNORMAL LOW (ref 150–400)
RBC: 3.1 MIL/uL — ABNORMAL LOW (ref 4.22–5.81)
RDW: 15 % (ref 11.5–15.5)
WBC: 13 10*3/uL — ABNORMAL HIGH (ref 4.0–10.5)
nRBC: 0 % (ref 0.0–0.2)

## 2020-10-19 LAB — RHEUMATOID FACTOR: Rheumatoid fact SerPl-aCnc: 21.5 IU/mL — ABNORMAL HIGH (ref <14.0)

## 2020-10-19 LAB — HEPARIN LEVEL (UNFRACTIONATED)
Heparin Unfractionated: 0.18 IU/mL — ABNORMAL LOW (ref 0.30–0.70)
Heparin Unfractionated: 0.26 IU/mL — ABNORMAL LOW (ref 0.30–0.70)
Heparin Unfractionated: 0.21 IU/mL — ABNORMAL LOW (ref 0.30–0.70)

## 2020-10-19 LAB — GLUCOSE, CAPILLARY
Glucose-Capillary: 197 mg/dL — ABNORMAL HIGH (ref 70–99)
Glucose-Capillary: 165 mg/dL — ABNORMAL HIGH (ref 70–99)
Glucose-Capillary: 169 mg/dL — ABNORMAL HIGH (ref 70–99)
Glucose-Capillary: 116 mg/dL — ABNORMAL HIGH (ref 70–99)
Glucose-Capillary: 144 mg/dL — ABNORMAL HIGH (ref 70–99)
Glucose-Capillary: 116 mg/dL — ABNORMAL HIGH (ref 70–99)

## 2020-10-19 LAB — BASIC METABOLIC PANEL
Anion gap: 10 (ref 5–15)
Anion gap: 9 (ref 5–15)
BUN: 58 mg/dL — ABNORMAL HIGH (ref 8–23)
BUN: 46 mg/dL — ABNORMAL HIGH (ref 8–23)
CO2: 28 mmol/L (ref 22–32)
CO2: 29 mmol/L (ref 22–32)
Calcium: 8.6 mg/dL — ABNORMAL LOW (ref 8.9–10.3)
Calcium: 8.5 mg/dL — ABNORMAL LOW (ref 8.9–10.3)
Chloride: 105 mmol/L (ref 98–111)
Chloride: 107 mmol/L (ref 98–111)
Creatinine, Ser: 1.25 mg/dL — ABNORMAL HIGH (ref 0.61–1.24)
Creatinine, Ser: 1.04 mg/dL (ref 0.61–1.24)
GFR, Estimated: >60 mL/min (ref >60)
GFR, Estimated: >60 mL/min (ref >60)
Glucose, Bld: 166 mg/dL — ABNORMAL HIGH (ref 70–99)
Glucose, Bld: 120 mg/dL — ABNORMAL HIGH (ref 70–99)
Potassium: 3.6 mmol/L (ref 3.5–5.1)
Potassium: 4.1 mmol/L (ref 3.5–5.1)
Sodium: 143 mmol/L (ref 135–145)
Sodium: 145 mmol/L (ref 135–145)

## 2020-10-19 LAB — PROTEIN ELECTROPHORESIS, SERUM
A/G Ratio: 0.8 (ref 0.7–1.7)
Albumin ELP: 2.6 g/dL — ABNORMAL LOW (ref 2.9–4.4)
Alpha-1-Globulin: 0.6 g/dL — ABNORMAL HIGH (ref 0.0–0.4)
Alpha-2-Globulin: 1.1 g/dL — ABNORMAL HIGH (ref 0.4–1.0)
Beta Globulin: 1.1 g/dL (ref 0.7–1.3)
Gamma Globulin: 0.5 g/dL (ref 0.4–1.8)
Globulin, Total: 3.3 g/dL (ref 2.2–3.9)
M-Spike, %: 0.4 g/dL — ABNORMAL HIGH
Total Protein ELP: 5.9 g/dL — ABNORMAL LOW (ref 6.0–8.5)

## 2020-10-19 LAB — MAGNESIUM: Magnesium: 2.9 mg/dL — ABNORMAL HIGH (ref 1.7–2.4)

## 2020-10-19 LAB — EPSTEIN-BARR VIRUS (EBV) ANTIBODY PROFILE
EBV NA IgG: 46.1 U/mL — ABNORMAL HIGH (ref 0.0–17.9)
EBV VCA IgG: 301 U/mL — ABNORMAL HIGH (ref 0.0–17.9)
EBV VCA IgM: <36 U/mL (ref 0.0–35.9)

## 2020-10-19 LAB — CMV ANTIBODY, IGG (EIA): CMV Ab - IgG: <0.6 U/mL (ref 0.00–0.59)

## 2020-10-19 LAB — ANGIOTENSIN CONVERTING ENZYME: Angiotensin-Converting Enzyme: 20 U/L (ref 14–82)

## 2020-10-19 LAB — CMV IGM: CMV IgM: <30 AU/mL (ref 0.0–29.9)

## 2020-10-19 MED ORDER — POTASSIUM CHLORIDE 20 MEQ PO PACK
40.0000 meq | PACK | Freq: Once | ORAL | Status: AC
  Administered 2020-10-19: 40 meq
  Filled 2020-10-19: qty 2

## 2020-10-19 MED ORDER — ACETAMINOPHEN 10 MG/ML IV SOLN
1000.0000 mg | Freq: Once | INTRAVENOUS | Status: AC
  Administered 2020-10-19: 1000 mg via INTRAVENOUS
  Filled 2020-10-19: qty 100

## 2020-10-19 MED ORDER — SODIUM CHLORIDE 0.9 % IV SOLN: INTRAVENOUS | Status: DC

## 2020-10-19 NOTE — Evaluation (Signed)
Clinical/Bedside Swallow Evaluation Patient Details  Name: Walter [name] MRN: [id number] Date of Birth: 1955-08-14  Today's Date: 10/19/2020 Time: SLP Start Time (ACUTE ONLY): 1240 SLP Stop Time (ACUTE ONLY): 1310 SLP Time Calculation (min) (ACUTE ONLY): 30 min  Past Medical History:  Past Medical History:  Diagnosis Date  . Atrial flutter (HCC)   . Dizzy 09/25/13  . Near syncope 09/25/13   Past Surgical History:  Past Surgical History:  Procedure Laterality Date  . A-FLUTTER ABLATION N/A 03/09/2020   Procedure: A-FLUTTER ABLATION;  Surgeon: Hillis Range, MD;  Location: [facility] INVASIVE CV LAB;  Service: Cardiovascular;  Laterality: N/A;  . CARDIOVERSION N/A 09/27/2013   Procedure: CARDIOVERSION;  Surgeon: Lewayne Bunting, MD;  Location: [facility] ENDOSCOPY;  Service: Cardiovascular;  Laterality: N/A;  . IR ANGIO EXTERNAL CAROTID SEL EXT CAROTID UNI R MOD SED  09/26/2020  . IR ANGIO INTRA EXTRACRAN SEL COM CAROTID INNOMINATE BILAT MOD SED  09/26/2020  . IR ANGIO VERTEBRAL SEL VERTEBRAL UNI R MOD SED  09/26/2020  . IR IVC FILTER PLMT / S&I /IMG GUID/MOD SED  10/08/2020  . IR US GUIDE VASC ACCESS RIGHT  09/26/2020  . RETINAL DETACHMENT SURGERY    . right knee arthroscopy    . TEE WITHOUT CARDIOVERSION N/A 09/27/2013   Procedure: TRANSESOPHAGEAL ECHOCARDIOGRAM (TEE);  Surgeon: Lewayne Bunting, MD;  Location[facility] ENDOSCOPY;  Service: Cardiovascular;  Laterality: N/A;  . WRIST SURGERY     HPI:  65 yo male presenting 3/28 with R-sided weakness and slurred speech. Imaging revealed acute hemorrhage centered within the left dorsal midbrain extending into the dorsal pons and inferomedial thalamus with intraventricular extension. EVD placement on 3/29. Extubated 3/30. Decrease in EVD drainage 3/31. Repeat CT shows significant clot in R lateral ventricle. EVD clotted 4/2 and intraventricular TpA administered. Reintubated 4/4. Tracheostomy 4/6. PMH Afib, COVID infection (July 2021), and retinal detachment.    Assessment / Plan / Recommendation Clinical Impression  Upon arrival for clinical swallow assessment, pt was holding cortrak tube in left hand having pulled it completely free from nose.  TF stopped and RN arrived.  Pt's VS stable.  Oral care provided. PMV placed and pt tolerated well with no changes in vital signs. He achieved low volume voicing - speech was poorly intelligible today, but pt was making greater efforts to speak and engage vocal folds. He participated in bedside swallow eval, demonstrating eagerness for ice chips, with active mastication but frequent loss of material from left side of mouth. He required verbal cues to seal lips around spoon and maintain closure while chewing.  He achieved a palpable swallow, leading to intermittent coughing, with effective expectoration into oral cavity.  As session progressed, fewer cues were needed to chew and initiate a swallow.  Given neuropathology and sites of lesion, as well as presence of trach, weak phonation, and significant deconditioning, pt will need an instrumental swallow study to fully assess physiology.  D/W RN. Will plan for MBS next date (radiology schedule already full for the day.) SLP Visit Diagnosis: Dysphagia, oropharyngeal phase (R13.12)    Aspiration Risk    tbd   Diet Recommendation    NPO - allow occasional ice chips after oral care and with PMV in place      Other  Recommendations Oral Care Recommendations: Oral care QID;Oral care prior to ice chip/H20   Follow up Recommendations Inpatient Rehab      Frequency and Duration min 2x/week  2 weeks  Prognosis Prognosis for Safe Diet Advancement: Fair      Swallow Study   General HPI: 65 yo male presenting 3/28 with R-sided weakness and slurred speech. Imaging revealed acute hemorrhage centered within the left dorsal midbrain extending into the dorsal pons and inferomedial thalamus with intraventricular extension. EVD placement on 3/29. Extubated 3/30.  Decrease in EVD drainage 3/31. Repeat CT shows significant clot in R lateral ventricle. EVD clotted 4/2 and intraventricular TpA administered. Reintubated 4/4. Tracheostomy 4/6. PMH Afib, COVID infection (July 2021), and retinal detachment. Type of Study: Bedside Swallow Evaluation Previous Swallow Assessment: no Diet Prior to this Study: NPO;NG Tube Temperature Spikes Noted: No Respiratory Status: Trach;Trach Collar Trach Size and Type: Cuff;#6;Deflated History of Recent Intubation: Yes Length of Intubations (days):  (multiple intubations with eventual trach 4/6) Behavior/Cognition: Lethargic/Drowsy Oral Cavity Assessment: Dried secretions Oral Care Completed by SLP: Yes Oral Cavity - Dentition: Adequate natural dentition Vision: Impaired for self-feeding Self-Feeding Abilities: Total assist Patient Positioning: Upright in bed Baseline Vocal Quality: Low vocal intensity;Not observed Volitional Cough: Weak Volitional Swallow: Able to elicit    Oral/Motor/Sensory Function Overall Oral Motor/Sensory Function: Moderate impairment Facial ROM: Reduced right Facial Symmetry: Abnormal symmetry right Facial Strength: Reduced right Lingual Symmetry: Within Functional Limits Lingual Strength: Reduced Velum: Other (comment) (poor elevation)   Ice Chips Ice chips: Impaired Presentation: Spoon Oral Phase Impairments: Reduced labial seal;Reduced lingual movement/coordination Oral Phase Functional Implications: Right anterior spillage;Left anterior spillage;Prolonged oral transit Pharyngeal Phase Impairments: Throat Clearing - Delayed   Thin Liquid Thin Liquid: Impaired Presentation: Spoon Oral Phase Impairments: Reduced labial seal;Reduced lingual movement/coordination Pharyngeal  Phase Impairments: Multiple swallows    Nectar Thick Nectar Thick Liquid: Not tested   Honey Thick Honey Thick Liquid: Not tested   Puree Puree: Not tested   Solid     Solid: Not tested      Blenda Mounts  Laurice 10/19/2020,1:30 PM   Marchelle Folks L. Samson Frederic, MA CCC/SLP Acute Rehabilitation Services Office number [phone] Pager [phone]

## 2020-10-19 NOTE — Progress Notes (Signed)
eLink Physician-Brief Progress Note Patient Name: Walter [name] DOB: 1955/11/28 MRN: [id number]   Date of Service  10/19/2020  HPI/Events of Note  Episode of AFIB and now back in NSR with HR = 72. Converted post Metoprolol IV.   eICU Interventions  Plan: 1. BMP and Mg++ level STAT.      Intervention Category Major Interventions: Arrhythmia - evaluation and management  [name], [name] 10/19/2020, 9:47 PM

## 2020-10-19 NOTE — Progress Notes (Signed)
During SLP session, patient removed CorTrak. Contacted Dr. Roda Shutters who gave order to wait for CorTrak and MBS tomorrow instead of placing NG today.

## 2020-10-19 NOTE — Progress Notes (Signed)
ANTICOAGULATION CONSULT NOTE   Pharmacy Consult for IV Heparin Indication: DVT + PE   Allergies  Allergen Reactions  . Keppra [Levetiracetam] Swelling    Patient experienced angioedema post inpatient keppra dose  . Latex Itching    Skin turns real red    Patient Measurements: Height: 6\' 2"  (188 cm) Weight: 97.4 kg (214 lb 11.7 oz) IBW/kg (Calculated) : 82.2 Heparin Dosing Weight: 97 kg  Vital Signs: Temp: 100.9 F (38.3 C) (04/21 1600) Temp Source: Axillary (04/21 1600) BP: 123/68 (04/21 1900) Pulse Rate: 80 (04/21 1900)  Labs: Recent Labs    10/17/20 0642 10/17/20 1941 10/18/20 0734 10/18/20 1535 10/19/20 0038 10/19/20 1001 10/19/20 1734  HGB 11.0*  --  10.2*  --  9.7*  --   --   HCT 35.1*  --  32.4*  --  31.0*  --   --   PLT 148*  --  128*  --  140*  --   --   HEPARINUNFRC  --    < > <0.10*   < > 0.18* 0.26* 0.21*  CREATININE 1.14  --  1.38*  --  1.25*  --   --   CKTOTAL  --   --  97  --   --   --   --    < > = values in this interval not displayed.    Estimated Creatinine Clearance: 68.5 mL/min (A) (by C-G formula based on SCr of 1.25 mg/dL (H)).   Assessment: 65 yr old man presented with an intraparenchymal hemorrhage and was later found to have a DVT. CT chest on 4/29 showed new bilateral PE. Given his recent ICH, he received an IVC filter, but is now starting therapeutic anticoag with IV heparin, per neurology, with NO BOLUS.  Heparin level ~6 hrs after increasing heparin infusion to 2050 units/hr was 0.21 units/ml, which is lower than last heparin level (0.26 units/ml), despite heparin infusion having been increased. H/H 9.7/31.0 (slight drop with AM labs), plt 140. Per RN, no issues with IV or bleeding observed.  Goal of Therapy:  Heparin level 0.3-0.5 units/ml Monitor platelets by anticoagulation protocol: Yes   Plan:  Increase heparin infusion to 2150 units/hr Check heparin level in 6 hrs Monitor daily heparin level, CBC Monitor for  bleeding  Thank you for allowing pharmacy to be a part of this patient's care.  [id number], PharmD, BCPS, [facility] Clinical Pharmacist 10/19/2020 7:50 PM

## 2020-10-19 NOTE — Progress Notes (Signed)
K 3.6 Electrolytes replaced per protocol 

## 2020-10-19 NOTE — Progress Notes (Signed)
ANTICOAGULATION CONSULT NOTE   Pharmacy Consult for heparin Indication: DVT + PE   Allergies  Allergen Reactions  . Keppra [Levetiracetam] Swelling    Patient experienced angioedema post inpatient keppra dose  . Latex Itching    Skin turns real red    Patient Measurements: Height: 6\' 2"  (188 cm) Weight: 97.4 kg (214 lb 11.7 oz) IBW/kg (Calculated) : 82.2 Heparin Dosing Weight: 97 kg  Vital Signs: Temp: 98.9 F (37.2 C) (04/21 0400) Temp Source: Axillary (04/21 0400) BP: 113/99 (04/21 0700) Pulse Rate: 78 (04/21 0700)  Labs: Recent Labs    10/17/20 0642 10/17/20 1941 10/18/20 0734 10/18/20 1535 10/19/20 0038  HGB 11.0*  --  10.2*  --  9.7*  HCT 35.1*  --  32.4*  --  31.0*  PLT 148*  --  128*  --  140*  HEPARINUNFRC  --    < > <0.10* 0.13* 0.18*  CREATININE 1.14  --  1.38*  --  1.25*  CKTOTAL  --   --  97  --   --    < > = values in this interval not displayed.    Estimated Creatinine Clearance: 68.5 mL/min (A) (by C-G formula based on SCr of 1.25 mg/dL (H)).   Assessment: 89 YOM who presented to the hospital with an intraparenchymal hemorrhage later found to have a DVT. CT chest on 4/29 with new bilateral PE. Given his recent ICH, he received an IVC filter but now starting therapeutic anticoag per neurology with NO BOLUS  Heparin level this morning remains SUBtherapeutic though trending up (HL 0.26 << 0.18, goal of 0.3-0.5). Hgb/Hct slight drop w/ AM labs - no bleeding or issues noted per discussion with RN.   Goal of Therapy:  Heparin level 0.3-0.5 units/ml Monitor platelets by anticoagulation protocol: Yes   Plan:  - Increase Heparin to 2050 units/hr (20.5 ml/hr) - Will continue to monitor for any signs/symptoms of bleeding and will follow up with heparin level in 6 hours   Thank you for allowing pharmacy to be a part of this patient's care.  5/29, PharmD, BCPS Clinical Pharmacist Clinical phone for 10/19/2020: [phone] 10/19/2020 11:04 AM    **Pharmacist phone directory can now be found on [email or web address]).  Listed under [facility].

## 2020-10-19 NOTE — Progress Notes (Signed)
Pharmacy Antibiotic Note  Walter [name] is a 64 y.o. male admitted on 09/25/2020 with persistent fevers concerning for ?bacteremia vs. VAP.  Pharmacy has been consulted for Unasyn dosing.  Of note, patient recently finished a course of Unasyn followed by Cefepime for acinetobacter pneumonia.  ID okay with stopping antibiotics since the patient has been on multiple courses this admission however CCM desires 5d course to cover lung pathogens - stop date added for 4/23.   Plan: - Continue Unasyn 3 gm IV Q 6 hours - stop 4/23 - Pharmacy will sign off of consult as no additional dose adjustments expected - will monitor peripherally   Height: 6\' 2"  (188 cm) Weight: 97.4 kg (214 lb 11.7 oz) IBW/kg (Calculated) : 82.2  Temp (24hrs), Avg:99.7 F (37.6 C), Min:98 F (36.7 C), Max:101.8 F (38.8 C)  Recent Labs  Lab 10/15/20 0313 10/16/20 0815 10/17/20 0642 10/18/20 0734 10/19/20 0038  WBC 8.9 10.6* 12.2* 11.1* 13.0*  CREATININE 0.87 0.95 1.14 1.38* 1.25*    Estimated Creatinine Clearance: 68.5 mL/min (A) (by C-G formula based on SCr of 1.25 mg/dL (H)).    Allergies  Allergen Reactions  . Keppra [Levetiracetam] Swelling    Patient experienced angioedema post inpatient keppra dose  . Latex Itching    Skin turns real red    CTX 4/1 >> 4/3 Unasyn 4/4 >> 4/8; 4/18 >> (4/23) Vanc 4/8 >> 4/9 Cefepime 4/8 >> 4/15  3/28 MRSA PCR >> neg 3/30 RCx (TA) >> moraxella catarrhalis +beta lactamase+ 4/5 TA >> final 4/8 BCx - Acinetobacter (R only to Zosyn; S to unasyn but bug was isolated on 5th day of unasyn course)  4/8 UCx - negative 4/8 TA - C.albicans 4/10 Cdiff - negative 4/18 TA >> normal oral flora  Thank you for allowing pharmacy to be a part of this patient's care.  5/18, PharmD, BCPS Clinical Pharmacist Clinical phone for 10/19/2020: 10/21/2020 10/19/2020 11:11 AM   **Pharmacist phone directory can now be found on [email or web address]).  Listed under [facility].

## 2020-10-19 NOTE — Progress Notes (Signed)
ANTICOAGULATION CONSULT NOTE   Pharmacy Consult for heparin Indication: DVT + PE   Allergies  Allergen Reactions  . Keppra [Levetiracetam] Swelling    Patient experienced angioedema post inpatient keppra dose  . Latex Itching    Skin turns real red    Patient Measurements: Height: 6\' 2"  (188 cm) Weight: 100.1 kg (220 lb 10.9 oz) IBW/kg (Calculated) : 82.2 Heparin Dosing Weight: 100.1 kg   Vital Signs: Temp: 100.2 F (37.9 C) (04/21 0131) Temp Source: Axillary (04/21 0131) BP: 116/66 (04/21 0100) Pulse Rate: 87 (04/21 0100)  Labs: Recent Labs    10/17/20 0642 10/17/20 1941 10/18/20 0734 10/18/20 1535 10/19/20 0038  HGB 11.0*  --  10.2*  --  9.7*  HCT 35.1*  --  32.4*  --  31.0*  PLT 148*  --  128*  --  140*  HEPARINUNFRC  --    < > <0.10* 0.13* 0.18*  CREATININE 1.14  --  1.38*  --  1.25*  CKTOTAL  --   --  97  --   --    < > = values in this interval not displayed.    Estimated Creatinine Clearance: 74.5 mL/min (A) (by C-G formula based on SCr of 1.25 mg/dL (H)).    Assessment: 21 YOM who presented to the hospital with an intraparenchymal hemorrhage later found to have a DVT. CT chest on 4/29 with new bilateral PE. Given his recent ICH, he received an IVC filter but now starting therapeutic anticoag per neurology with NO BOLUS  H/H low stable, Plt down. SCr up slightly.   heparin level is subtherapeutic at 0.13 on 1600 units/hr. No s/s of overt bleeding noted   4/21 AM update:  Heparin level remains low but trending up  Goal of Therapy:  Heparin level 0.3-0.5 units/ml Monitor platelets by anticoagulation protocol: Yes   Plan:  Inc heparin to 1950 units/hr 1000 heparin level  5/21, PharmD, BCPS Clinical Pharmacist Phone: [phone]

## 2020-10-19 NOTE — Progress Notes (Signed)
NAME:  Walter [name], MRN:  [id number], DOB:  [birth year], LOS: 24 ADMISSION DATE:  09/25/2020, CONSULTATION DATE:  09/25/20 REFERRING MD:  Wilford Corner,  CHIEF COMPLAINT:  Headache, r side weakness, slurred speech  History of Present Illness:  Mr. Whaling is a 65 yo man with hx of afib not on anticoagulation admitted with right-sided weakness, hemorrhagic stroke. Emergently intubated in the ED for airway protection  Pertinent  Medical History  Aflutter/afib (not on ac) S/p Ablation 03/2020, hx of Cardioversion 2015 Hx retinal detachment Covid infection in July 2021 treated with monoclonal antibody  Significant Hospital Events: Including procedures, antibiotic start and stop dates in addition to other pertinent events   . 3/28- Admit, intubated.  Not a candidate for TPA due to intracranial hemorrhage.  Started 3% saline . 3/29- EVD placement for developing hydrocephalus. CTA of the head was unremarkable . 3/30- Extubated. . 4/1 respiratory culture grew Moraxella, on ceftriaxone . 4/4 reintubated because of hypoxia and unresponsiveness . 4/6 tracheostomy . 4/7 still encephalopathic  . 4/8 remains encephalopathic . 4/10 Doppler shows bilateral DVT. IVC filter placed.  . 4/11 Blood cultures positive for Acinetobacter  . 4/12 Fever no worse off [place], off sedative infusions, tolerating trach collar.  . 4/13 Febrile @ 7am . 4/14 - 4/17 4/16 Afebrile Intermittently following simple commands and participated in PT, drowsy today . 4/18 febrile. Starting unasyn, sending trach asp . 4/19 T Max 101.7, WBC 12.2, ALT elevated, profoundly deconditioned . 4/20 awake and following commands. Trach aspirate w few GPC.  . Tolerated trach collar >24h  Interim History / Subjective:   Tmax 101.4 wBC 13   tolerated trach collar for more than 24 hours  Objective   Blood pressure 98/60, pulse 80, temperature 98.8 F (37.1 C), temperature source Axillary, resp. rate (!) 26, height 6\' 2"  (1.88 m), weight  97.4 kg, SpO2 97 %.    FiO2 (%):  [28 %] 28 %   Intake/Output Summary (Last 24 hours) at 10/19/2020 1105 Last data filed at 10/19/2020 1000 Gross per 24 hour  Intake 2217.46 ml  Output 2075 ml  Net 142.46 ml   Filed Weights   10/15/20 0400 10/16/20 0400 10/19/20 0500  Weight: 103 kg 100.1 kg 97.4 kg    Physical Exam: General: Ill appearing older adult M, lying in the bed HENT: [place] tacky mm with thick posterior pharynx secretions Trach secure.  Respiratory: symmetrical chest expansion, scattered upper rhonchi. Even and unlabored on ATC  Cardiovascular: rrr s1s2 no rgm  GI: Soft round ndnt + bowel sounds Extremities: BLE 3+ pitting edema. No acute joint deformity. No cyanosis or clubbing  Neuro: Awake, following commands BUE (L>R) BLE, sticks tongue out.   Labs/imaging that I havepersonally reviewed  (right click and "Reselect all SmartList Selections" daily)   4/8 BCx acinetobacter x1 4/18 trach asp>>few yeast, few GP cocci>> 10/17/2020>> CT Head Continuing diminishing density of blood along the ventriculostomy track in the right frontal region. No evidence of any new or increased bleeding. Diminishing dependent intraventricular blood in the lateral ventricles. Very small amount of subarachnoid blood and possibly subdural blood at the ventriculostomy entry site appears similar. 2. Small cortical infarctions of the left frontoparietal vertex are barely appreciable. No evidence of new or increased infarction. 3. Ventricular size remains stable without evidence of ongoing aqueductal obstruction. 4. 15 mm intra-axial cystic space in the left side of the upper pons and midbrain at the site of the previous hematoma. This may communicate with the  fourth ventricle.  Resolved Hospital Problem list   Acute obstructive hydrocephalus s/p EVD Hypernatremia Moraxella catarrhalis pneumonia s/p antibiotics Thrombocytopenia  Assessment & Plan:   Acute midbrain/dorsal pons/thalamic  IPH Acute encephalopathy due to IPH, delirium, sedation, possible infection  Continue neuro watch Stroke team is following Continue amantadine BID PRN oxycodone for pain  Acute hypoxic respiratory failure requiring mechanical ventilation, s/p tracheostomy  Moraxella catarrhalis PNA (has been tx)  Patient continued to spike fever with rising WBC count Respiratory culture was repeated which is growing gram-positive cocci cont unasyn, follow trach aspirate and dc if no source Patient has been tolerating trach collar for more than 24 hours, continue as tolerated and watch for respiratory distress Routine trach care   Bilateral DVT, bilateral PE, bilateral  Patient is status post IVC filter, plan was to remove on 4/24 Currently on IV heparin infusion  Paroxysmal Afib RVR  No more episodes of A. fib with RVR after 2.5 mg of IV metoprolol yesterday morning Continue diltiazem every 6 hours Continue heparin for secondary stroke prophylaxis  Acute kidney injury Urinary retention Patient continued to retain urine, voiding trial was done yesterday, he is still retaining his serum creatinine started rising we will closely watch serum creatinine, he may need replacement of Foley catheter unfortunately because of retention and obstructive hydro nephrosis Continue Urecholine  Best Practice (right click and "Reselect all SmartList Selections" daily)   Diet:  Tube Feed  on fiber for diarrhea  Pain/Anxiety/Delirium protocol (if indicated): RASS goal 0. PRN oxy fent  VAP protocol (if indicated): N/A DVT prophylaxis: Systemic AC heparin - IVC filter in place. Heparin gtt    GI prophylaxis: PPI Glucose control:  SSI Yes, basal, meal coverage  Central venous access:  N/A Arterial line:  N/A Foley:  N/A  Mobility:  bed rest  PT consulted: Yes Code Status:  full code Prognosis: Guarded  Total critical care time: 31 minutes  Performed by: Cheri Fowler   Critical care time was exclusive of  separately billable procedures and treating other patients.   Critical care was necessary to treat or prevent imminent or life-threatening deterioration.   Critical care was time spent personally by me on the following activities: development of treatment plan with patient and/or surrogate as well as nursing, discussions with consultants, evaluation of patient's response to treatment, examination of patient, obtaining history from patient or surrogate, ordering and performing treatments and interventions, ordering and review of laboratory studies, ordering and review of radiographic studies, pulse oximetry and re-evaluation of patient's condition.   Cheri Fowler MD [facility] See Amion for pager If no response to pager, please call [phone] until 7pm After 7pm, Please call E-link [phone]

## 2020-10-19 NOTE — Progress Notes (Signed)
STROKE TEAM PROGRESS NOTE   INTERVAL HISTORY Wife and the daughter and RN are at bedside.  Patient more awake alert than yesterday, eyes open, able to follow simple midline commands, did not follow peripheral commands with me.  However, per wife and daughter, patient has been working with PT/OT and speech home morning and he was doing well, more awake alert follow commands.  He was exhausted at the time of my visit.  Still has extensive secretions from tracheostomy.  CT chest abdomen and pelvis did not show infection source but identified pulmonary emboli, which I think likely related to his DVTs found several days ago.  Currently on heparin IV.  ID on board, agree with finish 5-day course of Unasyn.   Vitals:   10/18/20 2100 10/18/20 2200 10/18/20 2300 10/19/20 0000  BP: 115/81 108/60 112/65 119/66  Pulse: 95 94 95 95  Resp: (!) 29 (!) 30 (!) 27 (!) 29  Temp:    (!) 101.8 F (38.8 C)  TempSrc:    Axillary  SpO2: 94% 95% 94% 95%  Weight:      Height:       CBC:  Recent Labs  Lab 10/17/20 0642 10/18/20 0734  WBC 12.2* 11.1*  HGB 11.0* 10.2*  HCT 35.1* 32.4*  MCV 100.3* 100.6*  PLT 148* 128*   Basic Metabolic Panel:  Recent Labs  Lab 10/17/20 0642 10/18/20 0734  NA 140 138  K 4.2 3.9  CL 103 101  CO2 27 26  GLUCOSE 216* 243*  BUN 42* 59*  CREATININE 1.14 1.38*  CALCIUM 8.9 8.5*  MG 2.7* 3.0*    IMAGING  CT head 4/6 1. Ventricle size shows mild interval enlargement since yesterday. No significant hydrocephalus. Intraventricular hemorrhage stable 2. Right frontal ventricular drain unchanged. Hemorrhage surrounding the drain unchanged. 3. Subarachnoid hemorrhage unchanged. Hemorrhage in the left tectum unchanged. No new hemorrhage identified.  CT head 4/4 1. Interval decrease in intraventricular hemorrhage with decreased size of the lateral ventricles as compared to prior, right greater than left. Right parietal approach ventriculostomy in stable position with  tip near the septum pellucidum. 2. Stable small volume subarachnoid and intraparenchymal hemorrhage along the catheter tract. 3. Stable 1.9 cm hemorrhage at the left dorsal pons. 4. No other new acute intracranial abnormality. 09/29/2020  CT head 4/3 IMPRESSION: 1. New intraventricular hemorrhage in the third and lateral ventricles. Fourth ventricular and midbrain hemorrhage is unchanged. 2. New right frontal EVD which traverses the right lateral ventricle. Blood clot encompasses the lower catheter and ventriculomegaly is similar to prior. 3. Small cortical infarcts are newly seen along the superior left frontal convexity.  09/26/2020 IR Angio IMPRESSION: 1. No evidence of an AVM, dural arteriovenous fistula, aneurysm or other vascular abnormality to explain patient's brainstem hemorrhage.  09/26/2020 CT head:  IMPRESSION: 1. Unchanged left brainstem hemorrhage extending into the fourth ventricle. 2. Worsening hydrocephalus.  09/25/2020 MRI brain w/wo, MR Venogram IMPRESSION: 1. Acute hemorrhage centered within the left dorsal midbrain extending into the dorsal pons and inferomedial thalamus with intraventricular extension. No abnormal enhancement to suggest underlying lesion. 2. Compression of the cerebral aqueduct with possible mild obstructive hydrocephalus. 3. No evidence of dural sinus thrombosis. Attenuation of the left basal vein in the quadrigeminal cistern without definite occlusion.  09/25/2020 CT Angio head/neck IMPRESSION: 1. No vascular malformation, anomaly or aneurysm. 2. No emergent large vessel occlusion or high-grade stenosis of the intracranial arteries. 3. Unchanged size of dorsal left pontine hemorrhage with intraventricular extension.  09/25/2020 CT  head code stroke IMPRESSION: Acute intraparenchymal hemorrhage in the dorsal left pons with subarachnoid extension into the cerebral aqueduct and fourth Ventricle.  EEG-LTM 4/5-4/6  This study  showed periodic discharges with triphasic morphology at 1.5 to 2.5 Hz which is on the ictal-interictal continuum with low to intermediate potential for seizures.  Additionally there is evidence of moderate to severe diffuse encephalopathy, nonspecific etiology.  No seizures were seen during the study  EEG adult 10/13/2020 This study is suggestive of moderate diffuse encephalopathy, nonspecific etiology. No seizures or definite epileptiform discharges were seen throughout the recording. [name]   CT HEAD WO CONTRAST  Result Date: 10/17/2020 CLINICAL DATA:  Follow-up intracranial hemorrhage EXAM: CT HEAD WITHOUT CONTRAST TECHNIQUE: Contiguous axial images were obtained from the base of the skull through the vertex without intravenous contrast. COMPARISON:  10/11/2020 FINDINGS: Brain: Intraparenchymal hemorrhage in the left side the upper pons and midbrain no longer shows any hyperdense components. There is an intra-axial cystic space measuring up to 15 mm in diameter, which may communicate with the fourth ventricle. No focal cerebellar abnormality is seen. Continuing diminishing density of blood along the ventriculostomy track in the right frontal region. Persistent brain edema surrounding that. Small amount of subarachnoid blood possibly subdural blood the ventriculostomy entry site appears similar. No evidence of any new or increased bleeding. Small cortical infarctions of the left frontoparietal vertex are barely appreciable. No evidence of new brain infarction. Ventricular size remains stable without evidence of ongoing aqueductal obstruction. Previously seen hyperdense blood dependent within the occipital horns is diminishing. Vascular: No vascular finding. Skull: Normal other than the right frontal burr hole. Sinuses/Orbits: Paranasal sinuses are clear. Orbits are normal. No fluid in the middle ears. Small mastoid effusions. Other: None IMPRESSION: 1. Continuing diminishing density of blood along  the ventriculostomy track in the right frontal region. No evidence of any new or increased bleeding. Diminishing dependent intraventricular blood in the lateral ventricles. Very small amount of subarachnoid blood and possibly subdural blood at the ventriculostomy entry site appears similar. 2. Small cortical infarctions of the left frontoparietal vertex are barely appreciable. No evidence of new or increased infarction. 3. Ventricular size remains stable without evidence of ongoing aqueductal obstruction. 4. 15 mm intra-axial cystic space in the left side of the upper pons and midbrain at the site of the previous hematoma. This may communicate with the fourth ventricle. Electronically Signed   By: Paulina Fusi M.D.   On: 10/17/2020 07:35   CT HEAD WO CONTRAST  Result Date: 10/17/2020 CLINICAL DATA:  Follow-up intracranial hemorrhage EXAM: CT HEAD WITHOUT CONTRAST TECHNIQUE: Contiguous axial images were obtained from the base of the skull through the vertex without intravenous contrast. COMPARISON:  10/11/2020 FINDINGS: Brain: Intraparenchymal hemorrhage in the left side the upper pons and midbrain no longer shows any hyperdense components. There is an intra-axial cystic space measuring up to 15 mm in diameter, which may communicate with the fourth ventricle. No focal cerebellar abnormality is seen. Continuing diminishing density of blood along the ventriculostomy track in the right frontal region. Persistent brain edema surrounding that. Small amount of subarachnoid blood possibly subdural blood the ventriculostomy entry site appears similar. No evidence of any new or increased bleeding. Small cortical infarctions of the left frontoparietal vertex are barely appreciable. No evidence of new brain infarction. Ventricular size remains stable without evidence of ongoing aqueductal obstruction. Previously seen hyperdense blood dependent within the occipital horns is diminishing. Vascular: No vascular finding. Skull:  Normal other than the  right frontal burr hole. Sinuses/Orbits: Paranasal sinuses are clear. Orbits are normal. No fluid in the middle ears. Small mastoid effusions. Other: None IMPRESSION: 1. Continuing diminishing density of blood along the ventriculostomy track in the right frontal region. No evidence of any new or increased bleeding. Diminishing dependent intraventricular blood in the lateral ventricles. Very small amount of subarachnoid blood and possibly subdural blood at the ventriculostomy entry site appears similar. 2. Small cortical infarctions of the left frontoparietal vertex are barely appreciable. No evidence of new or increased infarction. 3. Ventricular size remains stable without evidence of ongoing aqueductal obstruction. 4. 15 mm intra-axial cystic space in the left side of the upper pons and midbrain at the site of the previous hematoma. This may communicate with the fourth ventricle. Electronically Signed   By: Paulina FusiMark  Shogry M.D.   On: 10/17/2020 07:35   CT CHEST W CONTRAST  Result Date: 10/17/2020 CLINICAL DATA:  Fever of unknown origin. EXAM: CT CHEST, ABDOMEN, AND PELVIS WITH CONTRAST TECHNIQUE: Multidetector CT imaging of the chest, abdomen and pelvis was performed following the standard protocol during bolus administration of intravenous contrast. CONTRAST:  100mL OMNIPAQUE IOHEXOL 300 MG/ML  SOLN COMPARISON:  Plain film 10/17/2020.  No prior CTs. FINDINGS: CT CHEST FINDINGS Cardiovascular: Aortic atherosclerosis. Normal heart size, without pericardial effusion. Bilateral pulmonary emboli are identified on this non dedicated exam. Example in the central right pulmonary artery on 28/3. Branch pulmonary arteries to both upper and lower lobes on 32/3. Within the left upper lobe pulmonary artery branch on 20/3. Mediastinum/Nodes: No mediastinal or hilar adenopathy. Lungs/Pleura: No pleural fluid. Endotracheal tube terminates appropriately. Areas of mild peripheral predominant airspace and  ground-glass are indeterminate. Example at the left apex on 22/5 and right lower lobe on 58/5. More dependent bibasilar airspace opacities are likely due to atelectasis. Musculoskeletal: No acute osseous abnormality. CT ABDOMEN PELVIS FINDINGS Hepatobiliary: Mild hepatomegaly at 18.4 cm craniocaudal. Normal gallbladder, without biliary ductal dilatation. Pancreas: Normal, without mass or ductal dilatation. Spleen: Normal in size, without focal abnormality. Adrenals/Urinary Tract: Normal adrenal glands. Bilateral renal sinus cysts, without hydronephrosis. Foley catheter within the urinary bladder. Stomach/Bowel: Nasogastric tube terminating at the gastric antrum. Rectal catheter in place. Scattered colonic diverticula. Normal terminal ileum and appendix. Normal small bowel. Vascular/Lymphatic: Aortic atherosclerosis. IVC filter is appropriately positioned. No abdominopelvic adenopathy. Reproductive: Mild prostatomegaly. Other: No significant free fluid. No free intraperitoneal air. Mild pelvic anasarca. tiny bilateral fat containing inguinal hernias. Musculoskeletal: Degenerative partial fusion of bilateral sacroiliac joints in. Degenerate disc disease at the lumbosacral junction. IMPRESSION: 1. Bilateral, large volume right greater than left pulmonary emboli on this nondedicated exam. No evidence of right heart strain. 2. Mild peripheral ground-glass opacities, favoring atelectasis or infarct. Atypical infection felt less likely. 3. Otherwise, no explanation for fever. 4.  Aortic Atherosclerosis (ICD10-I70.0). 5. Prostatomegaly. These results will be called to the ordering clinician or representative by the Radiologist Assistant, and communication documented in the PACS or [employer]. Electronically Signed   By: Jeronimo GreavesKyle  Talbot M.D.   On: 10/17/2020 14:43   CT ABDOMEN PELVIS W CONTRAST  Result Date: 10/17/2020 CLINICAL DATA:  Fever of unknown origin. EXAM: CT CHEST, ABDOMEN, AND PELVIS WITH CONTRAST  TECHNIQUE: Multidetector CT imaging of the chest, abdomen and pelvis was performed following the standard protocol during bolus administration of intravenous contrast. CONTRAST:  100mL OMNIPAQUE IOHEXOL 300 MG/ML  SOLN COMPARISON:  Plain film 10/17/2020.  No prior CTs. FINDINGS: CT CHEST FINDINGS Cardiovascular: Aortic atherosclerosis. Normal heart size, without  pericardial effusion. Bilateral pulmonary emboli are identified on this non dedicated exam. Example in the central right pulmonary artery on 28/3. Branch pulmonary arteries to both upper and lower lobes on 32/3. Within the left upper lobe pulmonary artery branch on 20/3. Mediastinum/Nodes: No mediastinal or hilar adenopathy. Lungs/Pleura: No pleural fluid. Endotracheal tube terminates appropriately. Areas of mild peripheral predominant airspace and ground-glass are indeterminate. Example at the left apex on 22/5 and right lower lobe on 58/5. More dependent bibasilar airspace opacities are likely due to atelectasis. Musculoskeletal: No acute osseous abnormality. CT ABDOMEN PELVIS FINDINGS Hepatobiliary: Mild hepatomegaly at 18.4 cm craniocaudal. Normal gallbladder, without biliary ductal dilatation. Pancreas: Normal, without mass or ductal dilatation. Spleen: Normal in size, without focal abnormality. Adrenals/Urinary Tract: Normal adrenal glands. Bilateral renal sinus cysts, without hydronephrosis. Foley catheter within the urinary bladder. Stomach/Bowel: Nasogastric tube terminating at the gastric antrum. Rectal catheter in place. Scattered colonic diverticula. Normal terminal ileum and appendix. Normal small bowel. Vascular/Lymphatic: Aortic atherosclerosis. IVC filter is appropriately positioned. No abdominopelvic adenopathy. Reproductive: Mild prostatomegaly. Other: No significant free fluid. No free intraperitoneal air. Mild pelvic anasarca. tiny bilateral fat containing inguinal hernias. Musculoskeletal: Degenerative partial fusion of bilateral  sacroiliac joints in. Degenerate disc disease at the lumbosacral junction. IMPRESSION: 1. Bilateral, large volume right greater than left pulmonary emboli on this nondedicated exam. No evidence of right heart strain. 2. Mild peripheral ground-glass opacities, favoring atelectasis or infarct. Atypical infection felt less likely. 3. Otherwise, no explanation for fever. 4.  Aortic Atherosclerosis (ICD10-I70.0). 5. Prostatomegaly. These results will be called to the ordering clinician or representative by the Radiologist Assistant, and communication documented in the PACS or [employer]. Electronically Signed   By: Jeronimo Greaves M.D.   On: 10/17/2020 14:43     PHYSICAL EXAM  Temp:  [98 F (36.7 C)-101.8 F (38.8 C)] 101.8 F (38.8 C) (04/21 0000) Pulse Rate:  [73-141] 95 (04/21 0000) Resp:  [16-37] 29 (04/21 0000) BP: (108-128)/(60-81) 119/66 (04/21 0000) SpO2:  [92 %-100 %] 95 % (04/21 0000) FiO2 (%):  [28 %-40 %] 28 % (04/21 0000)  General - Well nourished, well developed, on trach collar  Ophthalmologic - fundi not visualized due to noncooperation.  Cardiovascular - Regular rate and rhythm, not in afib.  Neuro - on trach collar now, eyes initially closed, but open with voice and able to keep them open during encounter, did not follow commands with me but RN reported tongue protrusion on command. With eye opening, eyes in mid position, not consistently blinking to visual threat, doll's eyes present, intermittent tracking to voice on the left, PERRL. Corneal reflex, gag and cough present.  Tongue protrusion not cooperative. On pain stimulation, slight withdraw movement in all extremities. DTR diminished and no babinski. Sensation, coordination not cooperative and gait not tested.   ASSESSMENT/PLAN Mr. Walter BOUYEA is a 65 y.o. male with history of atrial flutter newly diagnosed with atrial fibrillation in February 2022 not on anticoagulation, follows with Dr. Johney Frame in cardiology.  He  presented with right-sided weakness, slurred speech, headache, and vomiting.Last known well when he went to bed at 10 PM on 09/24/2020 and woke up at around 2 AM on the day of admission with slurred speech.  EMS was called.He was very drowsy upon their assessment and notable right side weakeness.  He had worsening mental status, GCS 5, so was emergently intubated in ED for airway protection.  His MRI brain showed acute hemorrhage at left midbrain extending into pons with IVH  associated with obstructive hydrocephalus.    ICH - Left Dorsalmidbrain ICHextending into dorsal pons and inferior thalamus with mild mass-effect on third ventricle and hydrocephalus,source unknown, suspect occult cavernoma  Code Stroke CT head-Acute IPH in the dorsal left pons with subarachnoid extension into the cerebral aqueduct and fourth ventricle.  CTA head & neck-No vascular malformation, anomaly or aneurysm. No emergent large vessel occlusion or high-grade stenosis. Unchanged size of dorsal left pontine hemorrhage with intraventricular extension.  MRI& MR Venogram-Acute hemorrhage centered within the left dorsal midbrain extending into the dorsal pons and inferomedial thalamus with intraventricular extension. Compression of the cerebral aqueduct with possible mild obstructive hydrocephalus. No evidence of dural sinus thrombosis.   CT Head W/O Contrast 3/29 0250 -Unchanged left brainstem hemorrhage extending into the fourth ventricle. Worsening hydrocephalus.  Diagnostic Cerebral Angiogram - No evidence of an aneurysm, AVM, dural AV fistula or other vascular abnormality to explain patient's known intraparenchymal hemorrhage.  MRI brain 4/8 -stable IVH, midbrain ICH and right frontal EVD tract hemorrhage.  Ventricular size stable.  2-3 punctate infarcts left frontal and parietal lobes, corresponding to small acute/subacute infarcts.  CT head 4/13 stable IVH, ICH, decreased ventricle size  CT repeat 4/19  continue evolution of ICH and IVH, nearly absorbed now  2D Echo- EF:55-60%. No wall motion abnormalities.  LDL- 88  HgbA1c5.7  VTE prophylaxis - subcu heparin  No antithromboticprior to admission, on heparin IV.  Therapy recommendations:CIR vs LTAC  Disposition:Pending  Obstructive hydrocephalus   EVD placed on 3/30   CT 4/1 showed clot in right lateral ventricle around catheter.   S/p 1mg  (1cc) alteplase placed in EVD 09/29/20 -> drain well ->ICP measuring 4-11 with a good waveform  CT head (4/3) resolution of most of the IVH and improved hydrocephalus.  EVD stopped working 4/2 overnight. 0.5mg  alteplase placed in EVD 10/01/2020 -> Post alteplase CT head new hemorrhage along the EVD tract an in the right lateral ventricle.  CT repeat 4/4 - decreased IVH and decompressed right lateral ventricle  CT repeat 4/5 -mild interval enlargement of ventricular size, no significant hydrocephalus.  IVH stable.  HCT 4/7 stable, with no appreciable change in overall ventricular size  HCT 4/13 decreased ventricular size  CT repeat 4/19 continue evolution of ICH and IVH, nearly absorbed now  4/16 will remove scalp staples.   Acute Respiratory Failure  Required intubation on 4/4 due to worsening MS and hypoxia  S/p trach 10/04/20  Off sedation  On trach collar with copious secretions  On robitussin PRN   Bilateral DVT Bilateral, large volume right greater than left PE  LE venous Doppler 4/10 with bilateral DVTs  Status post IVC filter  CT chest showed bilateral PE, which I believe related to her DVT found on 4/10, not after IVC filter  On heparin IV  Aspiration Pneumonia Community-acquired pneumonia with Moraxella Fever   Tmax 100.6->100.4->101.5->101.7  Off arctic sun  Sputum culture (3/30): Moraxella  Sputum culture 4/5: neg  WBC 9.8->8.5->8.7->8.9->10.6->12.2  CXR 4/7, 4/8 left basilar opacities  CXR 4/19 unremarkable  Blood culture 4/8 positive for  Acinetobacter  Off cefepime and vanco -> unasyn   PICC removed  ID on board - CT abd/pelvis/chest no infection source found  SIRPIDs, resolved Severe encephalopathy, improving  Left UE intermittent jerking with stimulation  LTM EEG: GPEDs with triphasic wave, generalized SIRPIDs, although focal seizure is in DDx  S/p keppra 10/03/20 -> d/c concerning for angioedema -> on vimpat 10/04/20-> off vimpat 10/11/20  EEG 4/4 moderate to  severe diffuse encephalopathy   Repeat spot EEG 4/15 moderate diffuse encephalopathy  Angioedema, resolved  Upper lip edema, much improved  Concerning for keppra allergy  Hypertension  Home meds:Diltiazem  Currently on Diltiazem 30 mg q6  off clonidine  BP goal Systolic < 160  Long-term BP goal normotensive  Hx of Aflutter PAF  Not on [facility] before admission  S/p CTI ablation for A flutter  off ASA  Not AC candidate at this time.  Afib with RVR intermittent -> sinus now  On heparin IV  Hyperlipidemia  Home meds:none  LDL88, goal < 70  AST/ALT 56/126->61/136  No statin now due to elevated LFTs  May consider to addstatin on discharge if LFT normalized  Dysphagia  Secondary to hemorrhagic stroke  NPO  SLP on board  Tube feeds @ 4ml/hr and IVF@ 25  May require PEG, family in agreement, will discuss with speech  Other Stroke Risk Factors  Advanced Age >/= 12  Snoring: sleep study denied by insurance prior to admission   Other Active Problems:  Hypokalemia  Lethargic - on amantadine   Hospital day #24  This patient is critically ill due to ICH, IVH, hydrocephalus, spesis, fever, s/p trach and at significant risk of neurological worsening, death form respiratory failure, obstructive hydrocephalus, sepsis, seizure. This patient's care requires constant monitoring of vital signs, hemodynamics, respiratory and cardiac monitoring, review of multiple databases, neurological assessment, discussion with family,  other specialists and medical decision making of high complexity. I spent 35 minutes of neurocritical care time in the care of this patient. I discussed with Dr. Merrily Pew CCM.   Marvel Plan, MD PhD Stroke Neurology 10/19/2020 1:00 AM

## 2020-10-19 NOTE — Progress Notes (Signed)
STROKE TEAM PROGRESS NOTE   INTERVAL HISTORY Wife and RN are at bedside.  Patient more awake alert and eyes open, on speaking valve but still very hypophonic, and intangible. However, following all simple commands. Still has right facial droop and right hemiparesis. Afebrile. On unasyn and heparin IV. Pending speech to see if PEG needed.    Vitals:   10/19/20 0800 10/19/20 0900 10/19/20 1000 10/19/20 1107  BP: (!) 121/95 113/60 98/60 117/63  Pulse: 76 80 80   Resp: 19 (!) 23 (!) 26   Temp: 98.8 F (37.1 C)     TempSrc: Axillary     SpO2: 100% 96% 97%   Weight:      Height:       CBC:  Recent Labs  Lab 10/18/20 0734 10/19/20 0038  WBC 11.1* 13.0*  HGB 10.2* 9.7*  HCT 32.4* 31.0*  MCV 100.6* 100.0  PLT 128* 140*   Basic Metabolic Panel:  Recent Labs  Lab 10/17/20 0642 10/18/20 0734 10/19/20 0038  NA 140 138 143  K 4.2 3.9 3.6  CL 103 101 105  CO2 27 26 28   GLUCOSE 216* 243* 166*  BUN 42* 59* 58*  CREATININE 1.14 1.38* 1.25*  CALCIUM 8.9 8.5* 8.6*  MG 2.7* 3.0*  --     IMAGING  CT head 4/6 1. Ventricle size shows mild interval enlargement since yesterday. No significant hydrocephalus. Intraventricular hemorrhage stable 2. Right frontal ventricular drain unchanged. Hemorrhage surrounding the drain unchanged. 3. Subarachnoid hemorrhage unchanged. Hemorrhage in the left tectum unchanged. No new hemorrhage identified.  CT head 4/4 1. Interval decrease in intraventricular hemorrhage with decreased size of the lateral ventricles as compared to prior, right greater than left. Right parietal approach ventriculostomy in stable position with tip near the septum pellucidum. 2. Stable small volume subarachnoid and intraparenchymal hemorrhage along the catheter tract. 3. Stable 1.9 cm hemorrhage at the left dorsal pons. 4. No other new acute intracranial abnormality. 09/29/2020  CT head 4/3 IMPRESSION: 1. New intraventricular hemorrhage in the third and  lateral ventricles. Fourth ventricular and midbrain hemorrhage is unchanged. 2. New right frontal EVD which traverses the right lateral ventricle. Blood clot encompasses the lower catheter and ventriculomegaly is similar to prior. 3. Small cortical infarcts are newly seen along the superior left frontal convexity.  09/26/2020 IR Angio IMPRESSION: 1. No evidence of an AVM, dural arteriovenous fistula, aneurysm or other vascular abnormality to explain patient's brainstem hemorrhage.  09/26/2020 CT head:  IMPRESSION: 1. Unchanged left brainstem hemorrhage extending into the fourth ventricle. 2. Worsening hydrocephalus.  09/25/2020 MRI brain w/wo, MR Venogram IMPRESSION: 1. Acute hemorrhage centered within the left dorsal midbrain extending into the dorsal pons and inferomedial thalamus with intraventricular extension. No abnormal enhancement to suggest underlying lesion. 2. Compression of the cerebral aqueduct with possible mild obstructive hydrocephalus. 3. No evidence of dural sinus thrombosis. Attenuation of the left basal vein in the quadrigeminal cistern without definite occlusion.  09/25/2020 CT Angio head/neck IMPRESSION: 1. No vascular malformation, anomaly or aneurysm. 2. No emergent large vessel occlusion or high-grade stenosis of the intracranial arteries. 3. Unchanged size of dorsal left pontine hemorrhage with intraventricular extension.  09/25/2020 CT head code stroke IMPRESSION: Acute intraparenchymal hemorrhage in the dorsal left pons with subarachnoid extension into the cerebral aqueduct and fourth Ventricle.  EEG-LTM 4/5-4/6  This study showed periodic discharges with triphasic morphology at 1.5 to 2.5 Hz which is on the ictal-interictal continuum with low to intermediate potential for seizures.  Additionally there is  evidence of moderate to severe diffuse encephalopathy, nonspecific etiology.  No seizures were seen during the study  EEG  adult 10/13/2020 This study is suggestive of moderate diffuse encephalopathy, nonspecific etiology. No seizures or definite epileptiform discharges were seen throughout the recording. Walter [name]   CT HEAD WO CONTRAST  Result Date: 10/17/2020 CLINICAL DATA:  Follow-up intracranial hemorrhage EXAM: CT HEAD WITHOUT CONTRAST TECHNIQUE: Contiguous axial images were obtained from the base of the skull through the vertex without intravenous contrast. COMPARISON:  10/11/2020 FINDINGS: Brain: Intraparenchymal hemorrhage in the left side the upper pons and midbrain no longer shows any hyperdense components. There is an intra-axial cystic space measuring up to 15 mm in diameter, which may communicate with the fourth ventricle. No focal cerebellar abnormality is seen. Continuing diminishing density of blood along the ventriculostomy track in the right frontal region. Persistent brain edema surrounding that. Small amount of subarachnoid blood possibly subdural blood the ventriculostomy entry site appears similar. No evidence of any new or increased bleeding. Small cortical infarctions of the left frontoparietal vertex are barely appreciable. No evidence of new brain infarction. Ventricular size remains stable without evidence of ongoing aqueductal obstruction. Previously seen hyperdense blood dependent within the occipital horns is diminishing. Vascular: No vascular finding. Skull: Normal other than the right frontal burr hole. Sinuses/Orbits: Paranasal sinuses are clear. Orbits are normal. No fluid in the middle ears. Small mastoid effusions. Other: None IMPRESSION: 1. Continuing diminishing density of blood along the ventriculostomy track in the right frontal region. No evidence of any new or increased bleeding. Diminishing dependent intraventricular blood in the lateral ventricles. Very small amount of subarachnoid blood and possibly subdural blood at the ventriculostomy entry site appears similar. 2. Small  cortical infarctions of the left frontoparietal vertex are barely appreciable. No evidence of new or increased infarction. 3. Ventricular size remains stable without evidence of ongoing aqueductal obstruction. 4. 15 mm intra-axial cystic space in the left side of the upper pons and midbrain at the site of the previous hematoma. This may communicate with the fourth ventricle. Electronically Signed   By: Paulina Fusi M.D.   On: 10/17/2020 07:35   CT HEAD WO CONTRAST  Result Date: 10/17/2020 CLINICAL DATA:  Follow-up intracranial hemorrhage EXAM: CT HEAD WITHOUT CONTRAST TECHNIQUE: Contiguous axial images were obtained from the base of the skull through the vertex without intravenous contrast. COMPARISON:  10/11/2020 FINDINGS: Brain: Intraparenchymal hemorrhage in the left side the upper pons and midbrain no longer shows any hyperdense components. There is an intra-axial cystic space measuring up to 15 mm in diameter, which may communicate with the fourth ventricle. No focal cerebellar abnormality is seen. Continuing diminishing density of blood along the ventriculostomy track in the right frontal region. Persistent brain edema surrounding that. Small amount of subarachnoid blood possibly subdural blood the ventriculostomy entry site appears similar. No evidence of any new or increased bleeding. Small cortical infarctions of the left frontoparietal vertex are barely appreciable. No evidence of new brain infarction. Ventricular size remains stable without evidence of ongoing aqueductal obstruction. Previously seen hyperdense blood dependent within the occipital horns is diminishing. Vascular: No vascular finding. Skull: Normal other than the right frontal burr hole. Sinuses/Orbits: Paranasal sinuses are clear. Orbits are normal. No fluid in the middle ears. Small mastoid effusions. Other: None IMPRESSION: 1. Continuing diminishing density of blood along the ventriculostomy track in the right frontal region. No  evidence of any new or increased bleeding. Diminishing dependent intraventricular blood in the lateral ventricles. Very  small amount of subarachnoid blood and possibly subdural blood at the ventriculostomy entry site appears similar. 2. Small cortical infarctions of the left frontoparietal vertex are barely appreciable. No evidence of new or increased infarction. 3. Ventricular size remains stable without evidence of ongoing aqueductal obstruction. 4. 15 mm intra-axial cystic space in the left side of the upper pons and midbrain at the site of the previous hematoma. This may communicate with the fourth ventricle. Electronically Signed   By: Paulina Fusi M.D.   On: 10/17/2020 07:35   CT CHEST W CONTRAST  Result Date: 10/17/2020 CLINICAL DATA:  Fever of unknown origin. EXAM: CT CHEST, ABDOMEN, AND PELVIS WITH CONTRAST TECHNIQUE: Multidetector CT imaging of the chest, abdomen and pelvis was performed following the standard protocol during bolus administration of intravenous contrast. CONTRAST:  OMNIPAQUE IOHEXOL 300 MG/ML  SOLN COMPARISON:  Plain film 10/17/2020.  No prior CTs. FINDINGS: CT CHEST FINDINGS Cardiovascular: Aortic atherosclerosis. Normal heart size, without pericardial effusion. Bilateral pulmonary emboli are identified on this non dedicated exam. Example in the central right pulmonary artery on 28/3. Branch pulmonary arteries to both upper and lower lobes on 32/3. Within the left upper lobe pulmonary artery branch on 20/3. Mediastinum/Nodes: No mediastinal or hilar adenopathy. Lungs/Pleura: No pleural fluid. Endotracheal tube terminates appropriately. Areas of mild peripheral predominant airspace and ground-glass are indeterminate. Example at the left apex on 22/5 and right lower lobe on 58/5. More dependent bibasilar airspace opacities are likely due to atelectasis. Musculoskeletal: No acute osseous abnormality. CT ABDOMEN PELVIS FINDINGS Hepatobiliary: Mild hepatomegaly at 18.4 cm  craniocaudal. Normal gallbladder, without biliary ductal dilatation. Pancreas: Normal, without mass or ductal dilatation. Spleen: Normal in size, without focal abnormality. Adrenals/Urinary Tract: Normal adrenal glands. Bilateral renal sinus cysts, without hydronephrosis. Foley catheter within the urinary bladder. Stomach/Bowel: Nasogastric tube terminating at the gastric antrum. Rectal catheter in place. Scattered colonic diverticula. Normal terminal ileum and appendix. Normal small bowel. Vascular/Lymphatic: Aortic atherosclerosis. IVC filter is appropriately positioned. No abdominopelvic adenopathy. Reproductive: Mild prostatomegaly. Other: No significant free fluid. No free intraperitoneal air. Mild pelvic anasarca. tiny bilateral fat containing inguinal hernias. Musculoskeletal: Degenerative partial fusion of bilateral sacroiliac joints in. Degenerate disc disease at the lumbosacral junction. IMPRESSION: 1. Bilateral, large volume right greater than left pulmonary emboli on this nondedicated exam. No evidence of right heart strain. 2. Mild peripheral ground-glass opacities, favoring atelectasis or infarct. Atypical infection felt less likely. 3. Otherwise, no explanation for fever. 4.  Aortic Atherosclerosis (ICD10-I70.0). 5. Prostatomegaly. These results will be called to the ordering clinician or representative by the Radiologist Assistant, and communication documented in the PACS or [employer]. Electronically Signed   By: Jeronimo Greaves M.D.   On: 10/17/2020 14:43   CT ABDOMEN PELVIS W CONTRAST  Result Date: 10/17/2020 CLINICAL DATA:  Fever of unknown origin. EXAM: CT CHEST, ABDOMEN, AND PELVIS WITH CONTRAST TECHNIQUE: Multidetector CT imaging of the chest, abdomen and pelvis was performed following the standard protocol during bolus administration of intravenous contrast. CONTRAST:  OMNIPAQUE IOHEXOL 300 MG/ML  SOLN COMPARISON:  Plain film 10/17/2020.  No prior CTs. FINDINGS: CT CHEST FINDINGS  Cardiovascular: Aortic atherosclerosis. Normal heart size, without pericardial effusion. Bilateral pulmonary emboli are identified on this non dedicated exam. Example in the central right pulmonary artery on 28/3. Branch pulmonary arteries to both upper and lower lobes on 32/3. Within the left upper lobe pulmonary artery branch on 20/3. Mediastinum/Nodes: No mediastinal or hilar adenopathy. Lungs/Pleura: No pleural fluid. Endotracheal tube terminates appropriately.  Areas of mild peripheral predominant airspace and ground-glass are indeterminate. Example at the left apex on 22/5 and right lower lobe on 58/5. More dependent bibasilar airspace opacities are likely due to atelectasis. Musculoskeletal: No acute osseous abnormality. CT ABDOMEN PELVIS FINDINGS Hepatobiliary: Mild hepatomegaly at 18.4 cm craniocaudal. Normal gallbladder, without biliary ductal dilatation. Pancreas: Normal, without mass or ductal dilatation. Spleen: Normal in size, without focal abnormality. Adrenals/Urinary Tract: Normal adrenal glands. Bilateral renal sinus cysts, without hydronephrosis. Foley catheter within the urinary bladder. Stomach/Bowel: Nasogastric tube terminating at the gastric antrum. Rectal catheter in place. Scattered colonic diverticula. Normal terminal ileum and appendix. Normal small bowel. Vascular/Lymphatic: Aortic atherosclerosis. IVC filter is appropriately positioned. No abdominopelvic adenopathy. Reproductive: Mild prostatomegaly. Other: No significant free fluid. No free intraperitoneal air. Mild pelvic anasarca. tiny bilateral fat containing inguinal hernias. Musculoskeletal: Degenerative partial fusion of bilateral sacroiliac joints in. Degenerate disc disease at the lumbosacral junction. IMPRESSION: 1. Bilateral, large volume right greater than left pulmonary emboli on this nondedicated exam. No evidence of right heart strain. 2. Mild peripheral ground-glass opacities, favoring atelectasis or infarct. Atypical  infection felt less likely. 3. Otherwise, no explanation for fever. 4.  Aortic Atherosclerosis (ICD10-I70.0). 5. Prostatomegaly. These results will be called to the ordering clinician or representative by the Radiologist Assistant, and communication documented in the PACS or [employer]. Electronically Signed   By: Jeronimo Greaves M.D.   On: 10/17/2020 14:43     PHYSICAL EXAM  Temp:  [98 F (36.7 C)-101.8 F (38.8 C)] 98.8 F (37.1 C) (04/21 0800) Pulse Rate:  [72-96] 80 (04/21 1000) Resp:  [19-37] 26 (04/21 1000) BP: (98-128)/(60-99) 117/63 (04/21 1107) SpO2:  [92 %-100 %] 97 % (04/21 1000) FiO2 (%):  [28 %] 28 % (04/21 1107) Weight:  [97.4 kg] 97.4 kg (04/21 0500)  General - Well nourished, well developed, on trach collar  Ophthalmologic - fundi not visualized due to noncooperation.  Cardiovascular - Regular rate and rhythm, not in afib.  Neuro - on trach collar now, eyes open on speaking valve, very hypophonic and intangible sounds, able to follow most simple commands. With eye opening, eyes in mid position, not consistently blinking to visual threat, able to track bilaterally, no obvious facial droop, PERRL. Tongue mildline. LUE at least 4/5, RUE proximal and bicep 0/4, but finger movement 3/5 able to follow commands on the right hand. LLE at least 3/5, RLE 2/5 with pain stimulation. DTR diminished and no babinski. Sensation, coordination not cooperative and gait not tested.   ASSESSMENT/PLAN Walter [name] is a 65 y.o. male with history of atrial flutter newly diagnosed with atrial fibrillation in February 2022 not on anticoagulation, follows with Dr. Johney Frame in cardiology.  He presented with right-sided weakness, slurred speech, headache, and vomiting.Last known well when he went to bed at 10 PM on 09/24/2020 and woke up at around 2 AM on the day of admission with slurred speech.  EMS was called.He was very drowsy upon their assessment and notable right side weakeness.  He  had worsening mental status, GCS 5, so was emergently intubated in ED for airway protection.  His MRI brain showed acute hemorrhage at left midbrain extending into pons with IVH associated with obstructive hydrocephalus.    ICH - Left Dorsalmidbrain ICHextending into dorsal pons and inferior thalamus with mild mass-effect on third ventricle and hydrocephalus,source unknown, suspect occult cavernoma  Code Stroke CT head-Acute IPH in the dorsal left pons with subarachnoid extension into the cerebral aqueduct and fourth ventricle.  CTA head & neck-No vascular malformation, anomaly or aneurysm. No emergent large vessel occlusion or high-grade stenosis. Unchanged size of dorsal left pontine hemorrhage with intraventricular extension.  MRI& MR Venogram-Acute hemorrhage centered within the left dorsal midbrain extending into the dorsal pons and inferomedial thalamus with intraventricular extension. Compression of the cerebral aqueduct with possible mild obstructive hydrocephalus. No evidence of dural sinus thrombosis.   CT Head W/O Contrast 3/29 0250 -Unchanged left brainstem hemorrhage extending into the fourth ventricle. Worsening hydrocephalus.  Diagnostic Cerebral Angiogram - No evidence of an aneurysm, AVM, dural AV fistula or other vascular abnormality to explain patient's known intraparenchymal hemorrhage.  MRI brain 4/8 -stable IVH, midbrain ICH and right frontal EVD tract hemorrhage.  Ventricular size stable.  2-3 punctate infarcts left frontal and parietal lobes, corresponding to small acute/subacute infarcts.  CT head 4/13 stable IVH, ICH, decreased ventricle size  CT repeat 4/19 continue evolution of ICH and IVH, nearly absorbed now  2D Echo- EF:55-60%. No wall motion abnormalities.  LDL- 88  HgbA1c5.7  VTE prophylaxis - subcu heparin  No antithromboticprior to admission, on heparin IV.  Therapy recommendations:CIR vs LTAC  Disposition:Pending  Obstructive  hydrocephalus   EVD placed on 3/30   CT 4/1 showed clot in right lateral ventricle around catheter.   S/p 1mg  (1cc) alteplase placed in EVD 09/29/20 -> drain well ->ICP measuring 4-11 with a good waveform  CT head (4/3) resolution of most of the IVH and improved hydrocephalus.  EVD stopped working 4/2 overnight. 0.5mg  alteplase placed in EVD 10/01/2020 -> Post alteplase CT head new hemorrhage along the EVD tract an in the right lateral ventricle.  CT repeat 4/4 - decreased IVH and decompressed right lateral ventricle  CT repeat 4/5 -mild interval enlargement of ventricular size, no significant hydrocephalus.  IVH stable.  HCT 4/7 stable, with no appreciable change in overall ventricular size  HCT 4/13 decreased ventricular size  CT repeat 4/19 continue evolution of ICH and IVH, nearly absorbed now  4/16 will remove scalp staples.   Acute Respiratory Failure  Required intubation on 4/4 due to worsening MS and hypoxia  S/p trach 10/04/20  Off sedation  On trach collar with improving secretions -> speaking valve  On robitussin PRN   Bilateral DVT Bilateral, large volume right greater than left PE  LE venous Doppler 4/10 with bilateral DVTs  Status post IVC filter  CT chest showed bilateral PE, which I believe related to her DVT found on 4/10, not after IVC filter  On heparin IV  Aspiration Pneumonia Community-acquired pneumonia with Moraxella Fever   Tmax 100.6->100.4->101.5->101.7->afebrile  Off arctic sun  Sputum culture (3/30): Moraxella  Sputum culture 4/5: neg  WBC 9.8->8.5->8.7->8.9->10.6->12.2->13.0  CXR 4/7, 4/8 left basilar opacities  CXR 4/19 unremarkable  Blood culture 4/8 positive for Acinetobacter  Off cefepime and vanco -> unasyn   PICC removed  ID on board - CT abd/pelvis/chest no infection source found  SIRPIDs, resolved Severe encephalopathy, improving  Left UE intermittent jerking with stimulation  LTM EEG: GPEDs with triphasic  wave, generalized SIRPIDs, although focal seizure is in DDx  S/p keppra 10/03/20 -> d/c concerning for angioedema -> on vimpat 10/04/20-> off vimpat 10/11/20  EEG 4/4 moderate to severe diffuse encephalopathy   Repeat spot EEG 4/15 moderate diffuse encephalopathy  Angioedema, resolved  Upper lip edema, much improved  Concerning for keppra allergy  Hypertension  Home meds:Diltiazem  Currently on Diltiazem 30 mg q6  off clonidine  BP goal Systolic < 160  Long-term  BP goal normotensive  Hx of Aflutter PAF  [facility]  S/p CTI ablation for A flutter  off ASA  Not AC candidate at this time.  Afib with RVR intermittent -> sinus now  On heparin IV  Hyperlipidemia  Home meds:none  LDL88, goal < 70  AST/ALT 56/126->61/136  No statin now due to elevated LFTs  May consider to addstatin on discharge if LFT normalized  Dysphagia  Secondary to hemorrhagic stroke  NPO  SLP on board  Tube feeds @ 3265ml/hr   May require PEG, family in agreement, will discuss with speech  Other Stroke Risk Factors  Advanced Age >/= 4465  Snoring: sleep study denied by insurance prior to admission   Other Active Problems:  Hypokalemia  AKI Cre 0.95->1.14->1.38->1.25, on TF  Lethargic - on amantadine   Hospital day #24  This patient is critically ill due to ICH, IVH, hydrocephalus, sepsis, fever, s/p trach, dysphagia and at significant risk of neurological worsening, death form sepsis, seizure, obstructive hydrocephalus, PE. This patient's care requires constant monitoring of vital signs, hemodynamics, respiratory and cardiac monitoring, review of multiple databases, neurological assessment, discussion with family, other specialists and medical decision making of high complexity. I spent 40 minutes of neurocritical care time in the care of this patient. I had long discussion with wife at bedside, updated pt current condition, treatment plan and potential  prognosis, and answered all the questions. She expressed understanding and appreciation.    Marvel PlanJindong Danyah Guastella, MD PhD Stroke Neurology 10/19/2020 11:28 AM

## 2020-10-19 NOTE — Progress Notes (Signed)
Subjective:  Awake and alert and trying to mouth words to his wife who is at the bedside.   Antibiotics:  Anti-infectives (From admission, onward)   Start     Dose/Rate Route Frequency Ordered Stop   10/18/20 1100  Ampicillin-Sulbactam (UNASYN) 3 g in sodium chloride 0.9 % 100 mL IVPB        3 g 200 mL/hr over 30 Minutes Intravenous Every 6 hours 10/18/20 0931 10/21/20 1159   10/16/20 1100  Ampicillin-Sulbactam (UNASYN) 3 g in sodium chloride 0.9 % 100 mL IVPB  Status:  Discontinued        3 g 200 mL/hr over 30 Minutes Intravenous Every 6 hours 10/16/20 0948 10/18/20 0859   10/07/20 0000  vancomycin (VANCOREADY) IVPB 1500 mg/300 mL  Status:  Discontinued        1,500 mg 150 mL/hr over 120 Minutes Intravenous Every 12 hours 10/06/20 1000 10/07/20 1406   10/06/20 1200  ceFEPIme (MAXIPIME) 2 g in sodium chloride 0.9 % 100 mL IVPB        2 g 200 mL/hr over 30 Minutes Intravenous Every 8 hours 10/06/20 1000 10/13/20 2225   10/06/20 1100  vancomycin (VANCOREADY) IVPB 2000 mg/400 mL        2,000 mg 200 mL/hr over 120 Minutes Intravenous  Once 10/06/20 1000 10/06/20 1314   10/02/20 0900  Ampicillin-Sulbactam (UNASYN) 3 g in sodium chloride 0.9 % 100 mL IVPB  Status:  Discontinued        3 g 200 mL/hr over 30 Minutes Intravenous Every 6 hours 10/02/20 0812 10/06/20 0954   09/29/20 1130  cefTRIAXone (ROCEPHIN) 2 g in sodium chloride 0.9 % 100 mL IVPB  Status:  Discontinued        2 g 200 mL/hr over 30 Minutes Intravenous Every 24 hours 09/29/20 1037 10/02/20 0812      Medications: Scheduled Meds: . amantadine  200 mg Per Tube BID  . bethanechol  30 mg Per Tube TID  . chlorhexidine gluconate (MEDLINE KIT)  15 mL Mouth Rinse BID  . Chlorhexidine Gluconate Cloth  6 each Topical Daily  . diltiazem  30 mg Per Tube Q6H  . feeding supplement (PROSource TF)  45 mL Per Tube BID  . fiber  1 packet Per Tube BID  . insulin aspart  0-20 Units Subcutaneous Q4H  . insulin aspart  4  Units Subcutaneous Q4H  . insulin glargine  6 Units Subcutaneous Daily  . mouth rinse  15 mL Mouth Rinse 10 times per day  . ondansetron (ZOFRAN) IV  4 mg Intravenous Q6H  . pantoprazole sodium  40 mg Per Tube QHS  . sodium chloride flush  10-40 mL Intracatheter Q12H   Continuous Infusions: . sodium chloride 10 mL/hr at 10/19/20 1200  . ampicillin-sulbactam (UNASYN) IV 3 g (10/19/20 1201)  . feeding supplement (VITAL 1.5 CAL) 1,000 mL (10/19/20 0047)  . heparin 2,050 Units/hr (10/19/20 1200)   PRN Meds:.sodium chloride, acetaminophen **OR** acetaminophen (TYLENOL) oral liquid 160 mg/5 mL **OR** acetaminophen, fentaNYL (SUBLIMAZE) injection, fentaNYL (SUBLIMAZE) injection, guaiFENesin-dextromethorphan, hydrALAZINE, metoprolol tartrate, oxyCODONE, sodium chloride flush    Objective: Weight change:   Intake/Output Summary (Last 24 hours) at 10/19/2020 1232 Last data filed at 10/19/2020 1200 Gross per 24 hour  Intake 2363.05 ml  Output 1475 ml  Net 888.05 ml   Blood pressure 117/60, pulse 94, temperature 99.3 F (37.4 C), temperature source Axillary, resp. rate (!) 22, height '6\' 2"'  (1.88 m), weight  97.4 kg, SpO2 96 %. Temp:  [98 F (36.7 C)-101.8 F (38.8 C)] 99.3 F (37.4 C) (04/21 1141) Pulse Rate:  [72-96] 94 (04/21 1200) Resp:  [19-41] 22 (04/21 1200) BP: (98-125)/(60-99) 117/60 (04/21 1200) SpO2:  [92 %-100 %] 96 % (04/21 1200) FiO2 (%):  [28 %] 28 % (04/21 1200) Weight:  [97.4 kg] 97.4 kg (04/21 0500)  Physical Exam: Physical Exam Constitutional:      Appearance: He is well-developed.  HENT:     Head: Normocephalic and atraumatic.     Nose: Nose normal.  Cardiovascular:     Rate and Rhythm: Normal rate and regular rhythm.     Heart sounds: No murmur heard. No friction rub. No gallop.   Pulmonary:     Effort: Pulmonary effort is normal. No respiratory distress.     Breath sounds: Rhonchi present.  Abdominal:     General: There is no distension.     Palpations:  Abdomen is soft.  Musculoskeletal:        General: No swelling. Normal range of motion.     Cervical back: Normal range of motion and neck supple.  Skin:    General: Skin is warm and dry.     Findings: No erythema or rash.  Neurological:     Mental Status: He is alert.      CBC:    BMET Recent Labs    10/18/20 0734 10/19/20 0038  NA 138 143  K 3.9 3.6  CL 101 105  CO2 26 28  GLUCOSE 243* 166*  BUN 59* 58*  CREATININE 1.38* 1.25*  CALCIUM 8.5* 8.6*     Liver Panel  Recent Labs    10/17/20 0642 10/18/20 0734  PROT 6.6 6.3*  ALBUMIN 2.8* 2.8*  AST 56* 61*  ALT 126* 136*  ALKPHOS 129* 129*  BILITOT 0.8 1.0  BILIDIR 0.2  --   IBILI 0.6  --        Sedimentation Rate Recent Labs    10/18/20 0734  ESRSEDRATE 24*   C-Reactive Protein Recent Labs    10/18/20 0734  CRP 17.2*    Micro Results: Recent Results (from the past 720 hour(s))  Resp Panel by RT-PCR (Flu A&B, Covid) Nasopharyngeal Swab     Status: None   Collection Time: 09/25/20  3:37 AM   Specimen: Nasopharyngeal Swab; Nasopharyngeal(NP) swabs in vial transport medium  Result Value Ref Range Status   SARS Coronavirus 2 by RT PCR NEGATIVE NEGATIVE Final    Comment: (NOTE) SARS-CoV-2 target nucleic acids are NOT DETECTED.  The SARS-CoV-2 RNA is generally detectable in upper respiratory specimens during the acute phase of infection. The lowest concentration of SARS-CoV-2 viral copies this assay can detect is 138 copies/mL. A negative result does not preclude SARS-Cov-2 infection and should not be used as the sole basis for treatment or other patient management decisions. A negative result may occur with  improper specimen collection/handling, submission of specimen other than nasopharyngeal swab, presence of viral mutation(s) within the areas targeted by this assay, and inadequate number of viral copies(<138 copies/mL). A negative result must be combined with clinical observations,  patient history, and epidemiological information. The expected result is Negative.  Fact Sheet for Patients:  EntrepreneurPulse.com.au  Fact Sheet for Healthcare Providers:  IncredibleEmployment.be  This test is no t yet approved or cleared by the [place] FDA and  has been authorized for detection and/or diagnosis of SARS-CoV-2 by FDA under an Emergency Use Authorization (EUA). This EUA will  remain  in effect (meaning this test can be used) for the duration of the COVID-19 declaration under Section 564(b)(1) of the Act, 21 U.S.C.section 360bbb-3(b)(1), unless the authorization is terminated  or revoked sooner.       Influenza A by PCR NEGATIVE NEGATIVE Final   Influenza B by PCR NEGATIVE NEGATIVE Final    Comment: (NOTE) The Xpert Xpress SARS-CoV-2/FLU/RSV plus assay is intended as an aid in the diagnosis of influenza from Nasopharyngeal swab specimens and should not be used as a sole basis for treatment. Nasal washings and aspirates are unacceptable for Xpert Xpress SARS-CoV-2/FLU/RSV testing.  Fact Sheet for Patients: EntrepreneurPulse.com.au  Fact Sheet for Healthcare Providers: IncredibleEmployment.be  This test is not yet approved or cleared by the [place] FDA and has been authorized for detection and/or diagnosis of SARS-CoV-2 by FDA under an Emergency Use Authorization (EUA). This EUA will remain in effect (meaning this test can be used) for the duration of the COVID-19 declaration under Section 564(b)(1) of the Act, 21 U.S.C. section 360bbb-3(b)(1), unless the authorization is terminated or revoked.  Performed at [facility], [place] [address], [place], [place] [address]   MRSA PCR Screening     Status: None   Collection Time: 09/25/20  5:18 AM   Specimen: Nasal Mucosa; Nasopharyngeal  Result Value Ref Range Status   MRSA by PCR NEGATIVE NEGATIVE Final    Comment:         The GeneXpert MRSA Assay (FDA approved for NASAL specimens only), is one component of a comprehensive MRSA colonization surveillance program. It is not intended to diagnose MRSA infection nor to guide or monitor treatment for MRSA infections. Performed at [facility], [place] [address], [place], [place] [address]   Culture, Respiratory w Gram Stain     Status: None   Collection Time: 09/27/20  9:20 AM   Specimen: Tracheal Aspirate; Respiratory  Result Value Ref Range Status   Specimen Description TRACHEAL ASPIRATE  Final   Special Requests NONE  Final   Gram Stain   Final    MODERATE WBC PRESENT, PREDOMINANTLY PMN MODERATE GRAM POSITIVE COCCI IN PAIRS RARE GRAM NEGATIVE COCCOBACILLI RARE GRAM POSITIVE RODS    Culture   Final    ABUNDANT MORAXELLA CATARRHALIS(BRANHAMELLA) BETA LACTAMASE POSITIVE Performed at [facility], [place] [address] [place], [place] [address]    Report Status 09/29/2020 FINAL  Final  Culture, Respiratory w Gram Stain     Status: None   Collection Time: 10/03/20  3:06 AM   Specimen: Tracheal Aspirate; Respiratory  Result Value Ref Range Status   Specimen Description TRACHEAL ASPIRATE  Final   Special Requests NONE  Final   Gram Stain   Final    RARE WBC PRESENT,BOTH PMN AND MONONUCLEAR NO ORGANISMS SEEN    Culture   Final    RARE Normal respiratory flora-no Staph aureus or Pseudomonas seen Performed at [facility], [address], [place], [place] [address]    Report Status 10/05/2020 FINAL  Final  Culture, blood (routine x 2)     Status: None   Collection Time: 10/06/20 10:55 AM   Specimen: BLOOD RIGHT HAND  Result Value Ref Range Status   Specimen Description BLOOD RIGHT HAND  Final   Special Requests   Final    BOTTLES DRAWN AEROBIC ONLY Blood Culture results may not be optimal due to an inadequate volume of blood received in culture bottles   Culture   Final  NO GROWTH 5 DAYS Performed at [facility], [place] [address] [place], [place] [address]    Report Status 10/11/2020 FINAL  Final  Culture, blood (routine x 2)     Status: Abnormal   Collection Time: 10/06/20 11:08 AM   Specimen: BLOOD  Result Value Ref Range Status   Specimen Description BLOOD FOOT  Final   Special Requests   Final    BOTTLES DRAWN AEROBIC ONLY Blood Culture results may not be optimal due to an inadequate volume of blood received in culture bottles   Culture  Setup Time   Final    GRAM NEGATIVE COCCOBACILLI AEROBIC BOTTLE ONLY Organism ID to follow CRITICAL RESULT CALLED TO, READ BACK BY AND VERIFIED WITH: M. [place], AT 1222 10/07/20 [place] Performed at [facility], [place] [address], [place], [place] [address]    Culture ACINETOBACTER SPECIES (A)  Final   Report Status 10/10/2020 FINAL  Final   Organism ID, Bacteria ACINETOBACTER SPECIES  Final      Susceptibility   Acinetobacter species - MIC*    CEFTAZIDIME 2 SENSITIVE Sensitive     CIPROFLOXACIN 0.5 SENSITIVE Sensitive     GENTAMICIN <=1 SENSITIVE Sensitive     IMIPENEM <=0.25 SENSITIVE Sensitive     PIP/TAZO 32 INTERMEDIATE Intermediate     TRIMETH/SULFA <=20 SENSITIVE Sensitive     AMPICILLIN/SULBACTAM <=2 SENSITIVE Sensitive     * ACINETOBACTER SPECIES  Blood Culture ID Panel (Reflexed)     Status: None   Collection Time: 10/06/20 11:08 AM  Result Value Ref Range Status   Enterococcus faecalis NOT DETECTED NOT DETECTED Final   Enterococcus Faecium NOT DETECTED NOT DETECTED Final   Listeria monocytogenes NOT DETECTED NOT DETECTED Final   Staphylococcus species NOT DETECTED NOT DETECTED Final   Staphylococcus aureus (BCID) NOT DETECTED NOT DETECTED Final   Staphylococcus epidermidis NOT DETECTED NOT DETECTED Final   Staphylococcus lugdunensis NOT DETECTED NOT DETECTED Final   Streptococcus species NOT DETECTED NOT DETECTED Final   Streptococcus agalactiae NOT DETECTED NOT DETECTED Final   Streptococcus pneumoniae NOT DETECTED NOT DETECTED Final    Streptococcus pyogenes NOT DETECTED NOT DETECTED Final   A.calcoaceticus-baumannii NOT DETECTED NOT DETECTED Final   Bacteroides fragilis NOT DETECTED NOT DETECTED Final   Enterobacterales NOT DETECTED NOT DETECTED Final   Enterobacter cloacae complex NOT DETECTED NOT DETECTED Final   Escherichia coli NOT DETECTED NOT DETECTED Final   Klebsiella aerogenes NOT DETECTED NOT DETECTED Final   Klebsiella oxytoca NOT DETECTED NOT DETECTED Final   Klebsiella pneumoniae NOT DETECTED NOT DETECTED Final   Proteus species NOT DETECTED NOT DETECTED Final   Salmonella species NOT DETECTED NOT DETECTED Final   Serratia marcescens NOT DETECTED NOT DETECTED Final   Haemophilus influenzae NOT DETECTED NOT DETECTED Final   Neisseria meningitidis NOT DETECTED NOT DETECTED Final   Pseudomonas aeruginosa NOT DETECTED NOT DETECTED Final   Stenotrophomonas maltophilia NOT DETECTED NOT DETECTED Final   Candida albicans NOT DETECTED NOT DETECTED Final   Candida auris NOT DETECTED NOT DETECTED Final   Candida glabrata NOT DETECTED NOT DETECTED Final   Candida krusei NOT DETECTED NOT DETECTED Final   Candida parapsilosis NOT DETECTED NOT DETECTED Final   Candida tropicalis NOT DETECTED NOT DETECTED Final   Cryptococcus neoformans/gattii NOT DETECTED NOT DETECTED Final    Comment: Performed at [facility], [place] [address], [place], [place] [address]  Culture, Urine     Status: None  Collection Time: 10/06/20  1:37 PM   Specimen: Urine, Clean Catch  Result Value Ref Range Status   Specimen Description URINE, CLEAN CATCH  Final   Special Requests NONE  Final   Culture   Final    NO GROWTH Performed at [facility], [address] [place], [place] [address]    Report Status 10/07/2020 FINAL  Final  Culture, Respiratory w Gram Stain     Status: None   Collection Time: 10/06/20 11:00 PM   Specimen: Tracheal Aspirate; Respiratory  Result Value Ref Range Status   Specimen Description TRACHEAL  ASPIRATE  Final   Special Requests NONE  Final   Gram Stain   Final    MODERATE WBC PRESENT, PREDOMINANTLY PMN RARE YEAST    Culture   Final    RARE CANDIDA ALBICANS NO STAPHYLOCOCCUS AUREUS ISOLATED No Pseudomonas species isolated Performed at [facility], [address] [place], [place] [address]    Report Status 10/10/2020 FINAL  Final  C Difficile Quick Screen (NO PCR Reflex)     Status: None   Collection Time: 10/08/20 11:07 AM   Specimen: STOOL  Result Value Ref Range Status   C Diff antigen NEGATIVE NEGATIVE Final   C Diff toxin NEGATIVE NEGATIVE Final   C Diff interpretation No C. difficile detected.  Final    Comment: Performed at [facility], [place] [address], [place], [place] [address]  Culture, Respiratory w Gram Stain     Status: None   Collection Time: 10/16/20 10:56 AM   Specimen: Tracheal Aspirate; Respiratory  Result Value Ref Range Status   Specimen Description TRACHEAL ASPIRATE  Final   Special Requests NONE  Final   Gram Stain   Final    RARE SQUAMOUS EPITHELIAL CELLS PRESENT MODERATE WBC PRESENT, PREDOMINANTLY PMN FEW YEAST FEW GRAM POSITIVE COCCI    Culture   Final    MODERATE Normal respiratory flora-no Staph aureus or Pseudomonas seen Performed at [facility], [place] [address], [place], [place] [address]    Report Status 10/18/2020 FINAL  Final    Studies/Results: CT CHEST W CONTRAST  Result Date: 10/17/2020 CLINICAL DATA:  Fever of unknown origin. EXAM: CT CHEST, ABDOMEN, AND PELVIS WITH CONTRAST TECHNIQUE: Multidetector CT imaging of the chest, abdomen and pelvis was performed following the standard protocol during bolus administration of intravenous contrast. CONTRAST:  147m OMNIPAQUE IOHEXOL 300 MG/ML  SOLN COMPARISON:  Plain film 10/17/2020.  No prior CTs. FINDINGS: CT CHEST FINDINGS Cardiovascular: Aortic atherosclerosis. Normal heart size, without pericardial effusion. Bilateral pulmonary emboli are identified on this non  dedicated exam. Example in the central right pulmonary artery on 28/3. Branch pulmonary arteries to both upper and lower lobes on 32/3. Within the left upper lobe pulmonary artery branch on 20/3. Mediastinum/Nodes: No mediastinal or hilar adenopathy. Lungs/Pleura: No pleural fluid. Endotracheal tube terminates appropriately. Areas of mild peripheral predominant airspace and ground-glass are indeterminate. Example at the left apex on 22/5 and right lower lobe on 58/5. More dependent bibasilar airspace opacities are likely due to atelectasis. Musculoskeletal: No acute osseous abnormality. CT ABDOMEN PELVIS FINDINGS Hepatobiliary: Mild hepatomegaly at 18.4 cm craniocaudal. Normal gallbladder, without biliary ductal dilatation. Pancreas: Normal, without mass or ductal dilatation. Spleen: Normal in size, without focal abnormality. Adrenals/Urinary Tract: Normal adrenal glands. Bilateral renal sinus cysts, without hydronephrosis. Foley catheter within the urinary bladder. Stomach/Bowel: Nasogastric tube terminating at the gastric antrum. Rectal catheter in place. Scattered colonic diverticula. Normal terminal  ileum and appendix. Normal small bowel. Vascular/Lymphatic: Aortic atherosclerosis. IVC filter is appropriately positioned. No abdominopelvic adenopathy. Reproductive: Mild prostatomegaly. Other: No significant free fluid. No free intraperitoneal air. Mild pelvic anasarca. tiny bilateral fat containing inguinal hernias. Musculoskeletal: Degenerative partial fusion of bilateral sacroiliac joints in. Degenerate disc disease at the lumbosacral junction. IMPRESSION: 1. Bilateral, large volume right greater than left pulmonary emboli on this nondedicated exam. No evidence of right heart strain. 2. Mild peripheral ground-glass opacities, favoring atelectasis or infarct. Atypical infection felt less likely. 3. Otherwise, no explanation for fever. 4.  Aortic Atherosclerosis (ICD10-I70.0). 5. Prostatomegaly. These results  will be called to the ordering clinician or representative by the Radiologist Assistant, and communication documented in the PACS or [employer]. Electronically Signed   By: Abigail Miyamoto M.D.   On: 10/17/2020 14:43   CT ABDOMEN PELVIS W CONTRAST  Result Date: 10/17/2020 CLINICAL DATA:  Fever of unknown origin. EXAM: CT CHEST, ABDOMEN, AND PELVIS WITH CONTRAST TECHNIQUE: Multidetector CT imaging of the chest, abdomen and pelvis was performed following the standard protocol during bolus administration of intravenous contrast. CONTRAST:  172m OMNIPAQUE IOHEXOL 300 MG/ML  SOLN COMPARISON:  Plain film 10/17/2020.  No prior CTs. FINDINGS: CT CHEST FINDINGS Cardiovascular: Aortic atherosclerosis. Normal heart size, without pericardial effusion. Bilateral pulmonary emboli are identified on this non dedicated exam. Example in the central right pulmonary artery on 28/3. Branch pulmonary arteries to both upper and lower lobes on 32/3. Within the left upper lobe pulmonary artery branch on 20/3. Mediastinum/Nodes: No mediastinal or hilar adenopathy. Lungs/Pleura: No pleural fluid. Endotracheal tube terminates appropriately. Areas of mild peripheral predominant airspace and ground-glass are indeterminate. Example at the left apex on 22/5 and right lower lobe on 58/5. More dependent bibasilar airspace opacities are likely due to atelectasis. Musculoskeletal: No acute osseous abnormality. CT ABDOMEN PELVIS FINDINGS Hepatobiliary: Mild hepatomegaly at 18.4 cm craniocaudal. Normal gallbladder, without biliary ductal dilatation. Pancreas: Normal, without mass or ductal dilatation. Spleen: Normal in size, without focal abnormality. Adrenals/Urinary Tract: Normal adrenal glands. Bilateral renal sinus cysts, without hydronephrosis. Foley catheter within the urinary bladder. Stomach/Bowel: Nasogastric tube terminating at the gastric antrum. Rectal catheter in place. Scattered colonic diverticula. Normal terminal ileum and  appendix. Normal small bowel. Vascular/Lymphatic: Aortic atherosclerosis. IVC filter is appropriately positioned. No abdominopelvic adenopathy. Reproductive: Mild prostatomegaly. Other: No significant free fluid. No free intraperitoneal air. Mild pelvic anasarca. tiny bilateral fat containing inguinal hernias. Musculoskeletal: Degenerative partial fusion of bilateral sacroiliac joints in. Degenerate disc disease at the lumbosacral junction. IMPRESSION: 1. Bilateral, large volume right greater than left pulmonary emboli on this nondedicated exam. No evidence of right heart strain. 2. Mild peripheral ground-glass opacities, favoring atelectasis or infarct. Atypical infection felt less likely. 3. Otherwise, no explanation for fever. 4.  Aortic Atherosclerosis (ICD10-I70.0). 5. Prostatomegaly. These results will be called to the ordering clinician or representative by the Radiologist Assistant, and communication documented in the PACS or [employer] Electronically Signed   By: KAbigail MiyamotoM.D.   On: 10/17/2020 14:43   DG CHEST PORT 1 VIEW  Result Date: 10/18/2020 CLINICAL DATA:  Fever. EXAM: PORTABLE CHEST 1 VIEW COMPARISON:  CT 10/17/2020.  Chest x-ray 10/17/2020. FINDINGS: Tracheostomy tube and feeding tube are in stable position. Heart size normal. Low lung volumes. Mild right base subsegment atelectasis/infiltrate. No pleural effusion or pneumothorax. IVC filter noted over the right upper abdomen. Degenerative change thoracic spine. IMPRESSION: 1. Lines and tubes stable position. 2. Mild right base subsegmental atelectasis/infiltrate. Electronically Signed  By: [place]   On: 10/18/2020 06:04      Assessment/Plan:  INTERVAL HISTORY: Patient with a temperature of 101 overnight   Active Problems:   ICH (intracerebral hemorrhage) (HCC)   Intracranial hemorrhage (HCC)   Atrial fibrillation ([place])   Prediabetes   Leukocytosis   Acute blood loss anemia   Hypernatremia    CORBEN AUZENNE is a 65 y.o. male with  history of atrial fibrillation prior ablation in 2021 who was not on anticoagulation was admitted with profound right-sided weakness and found to have a hemorrhagic stroke on September 25, 2020.  He required placement of extraventricular drain   He has had fevers nearly the entire time he has been here and also been on antibiotics for most of that time.  As far as targeted therapy she has been treated for Moraxella (iin lungs) and Acinetobactr (grew in 1/2 blood cultures)  He has had a CT chest abdomen pelvis performed yesterday.  There was nothing seen in terms of a clear-cut antibiotic target in the abdomen and pelvis.  He does have extensive pulmonary emboli in the lungs and recently began anticoagulation in the context of DVTs  My understanding is that the pulmonary critical care wants to continue the Unasyn that was started to cover for pathology in his lungs.  I agree his secretions have been fairly impressive his CT of the lungs is fairly underwhelming.  Would not oppose giving him 5 days of treatment but then would stop because he is getting a lot of antibiotic selective pressure for multidrug-resistant organisms   I will sign off for now please call with further questions.      LOS: 24 days   Alcide Evener 10/19/2020, 12:32 PM

## 2020-10-20 ENCOUNTER — Inpatient Hospital Stay (HOSPITAL_COMMUNITY): Payer: Medicare Other

## 2020-10-20 DIAGNOSIS — J988 Other specified respiratory disorders: Secondary | ICD-10-CM | POA: Diagnosis not present

## 2020-10-20 DIAGNOSIS — Z930 Tracheostomy status: Secondary | ICD-10-CM | POA: Diagnosis not present

## 2020-10-20 DIAGNOSIS — R509 Fever, unspecified: Secondary | ICD-10-CM | POA: Diagnosis not present

## 2020-10-20 LAB — CBC
HCT: 32.4 % — ABNORMAL LOW (ref 39.0–52.0)
Hemoglobin: 9.9 g/dL — ABNORMAL LOW (ref 13.0–17.0)
MCH: 31.4 pg (ref 26.0–34.0)
MCHC: 30.6 g/dL (ref 30.0–36.0)
MCV: 102.9 fL — ABNORMAL HIGH (ref 80.0–100.0)
Platelets: 156 10*3/uL (ref 150–400)
RBC: 3.15 MIL/uL — ABNORMAL LOW (ref 4.22–5.81)
RDW: 15.1 % (ref 11.5–15.5)
WBC: 10.7 10*3/uL — ABNORMAL HIGH (ref 4.0–10.5)
nRBC: 0 % (ref 0.0–0.2)

## 2020-10-20 LAB — BASIC METABOLIC PANEL
Anion gap: 10 (ref 5–15)
BUN: 37 mg/dL — ABNORMAL HIGH (ref 8–23)
CO2: 28 mmol/L (ref 22–32)
Calcium: 8.7 mg/dL — ABNORMAL LOW (ref 8.9–10.3)
Chloride: 109 mmol/L (ref 98–111)
Creatinine, Ser: 0.97 mg/dL (ref 0.61–1.24)
GFR, Estimated: >60 mL/min (ref >60)
Glucose, Bld: 124 mg/dL — ABNORMAL HIGH (ref 70–99)
Potassium: 3.8 mmol/L (ref 3.5–5.1)
Sodium: 147 mmol/L — ABNORMAL HIGH (ref 135–145)

## 2020-10-20 LAB — GLUCOSE, CAPILLARY
Glucose-Capillary: 135 mg/dL — ABNORMAL HIGH (ref 70–99)
Glucose-Capillary: 133 mg/dL — ABNORMAL HIGH (ref 70–99)
Glucose-Capillary: 101 mg/dL — ABNORMAL HIGH (ref 70–99)
Glucose-Capillary: 142 mg/dL — ABNORMAL HIGH (ref 70–99)
Glucose-Capillary: 125 mg/dL — ABNORMAL HIGH (ref 70–99)
Glucose-Capillary: 136 mg/dL — ABNORMAL HIGH (ref 70–99)

## 2020-10-20 LAB — MPO/PR-3 (ANCA) ANTIBODIES
ANCA Proteinase 3: <3.5 U/mL (ref 0.0–3.5)
Myeloperoxidase Abs: <9 U/mL (ref 0.0–9.0)

## 2020-10-20 LAB — HEPARIN LEVEL (UNFRACTIONATED)
Heparin Unfractionated: 0.38 IU/mL (ref 0.30–0.70)
Heparin Unfractionated: 0.36 IU/mL (ref 0.30–0.70)

## 2020-10-20 MED ORDER — APIXABAN 5 MG PO TABS
5.0000 mg | ORAL_TABLET | Freq: Two times a day (BID) | ORAL | Status: DC
  Administered 2020-10-20 – 2020-10-24 (×9): 5 mg
  Filled 2020-10-20 (×10): qty 1

## 2020-10-20 MED ORDER — AMANTADINE HCL 50 MG/5ML PO SOLN
200.0000 mg | Freq: Two times a day (BID) | ORAL | Status: DC
  Filled 2020-10-20 (×2): qty 20

## 2020-10-20 MED ORDER — APIXABAN 5 MG PO TABS: 5.0000 mg | ORAL_TABLET | Freq: Two times a day (BID) | ORAL | Status: DC

## 2020-10-20 MED ORDER — DILTIAZEM HCL ER COATED BEADS 120 MG PO CP24
120.0000 mg | ORAL_CAPSULE | Freq: Every day | ORAL | Status: DC
  Filled 2020-10-20: qty 1

## 2020-10-20 MED ORDER — PROSOURCE PLUS PO LIQD
30.0000 mL | Freq: Two times a day (BID) | ORAL | Status: DC
  Administered 2020-10-20: 30 mL via ORAL
  Filled 2020-10-20 (×3): qty 30

## 2020-10-20 MED ORDER — NUTRISOURCE FIBER PO PACK
1.0000 | PACK | Freq: Two times a day (BID) | ORAL | Status: DC
  Administered 2020-10-21 – 2020-10-23 (×5): 1 via ORAL
  Filled 2020-10-20 (×7): qty 1

## 2020-10-20 MED ORDER — AMANTADINE HCL 50 MG/5ML PO SOLN
200.0000 mg | Freq: Two times a day (BID) | ORAL | Status: DC
  Administered 2020-10-21 – 2020-10-28 (×15): 200 mg
  Filled 2020-10-20 (×17): qty 20

## 2020-10-20 MED ORDER — ACETAMINOPHEN 650 MG RE SUPP
650.0000 mg | RECTAL | Status: DC | PRN
  Filled 2020-10-20: qty 1

## 2020-10-20 MED ORDER — DILTIAZEM 12 MG/ML ORAL SUSPENSION
30.0000 mg | Freq: Four times a day (QID) | ORAL | Status: DC
  Administered 2020-10-20 (×2): 30 mg via ORAL
  Filled 2020-10-20 (×4): qty 3

## 2020-10-20 MED ORDER — OXYCODONE HCL 5 MG PO TABS: 5.0000 mg | ORAL_TABLET | Freq: Four times a day (QID) | ORAL | Status: DC | PRN

## 2020-10-20 MED ORDER — DILTIAZEM 12 MG/ML ORAL SUSPENSION
30.0000 mg | Freq: Four times a day (QID) | ORAL | Status: DC
  Administered 2020-10-20 – 2020-10-28 (×31): 30 mg
  Filled 2020-10-20 (×36): qty 3

## 2020-10-20 MED ORDER — FREE WATER
100.0000 mL | Status: DC
  Administered 2020-10-20 – 2020-10-21 (×4): 100 mL

## 2020-10-20 MED ORDER — ACETAMINOPHEN 325 MG PO TABS
650.0000 mg | ORAL_TABLET | ORAL | Status: DC | PRN
  Administered 2020-10-26: 650 mg
  Filled 2020-10-20: qty 2

## 2020-10-20 MED ORDER — BETHANECHOL CHLORIDE 10 MG PO TABS
30.0000 mg | ORAL_TABLET | Freq: Three times a day (TID) | ORAL | Status: DC
  Administered 2020-10-20 (×2): 30 mg via ORAL
  Filled 2020-10-20 (×2): qty 3

## 2020-10-20 MED ORDER — VITAL 1.5 CAL PO LIQD
650.0000 mL | ORAL | Status: DC
  Administered 2020-10-20: 650 mL
  Filled 2020-10-20 (×2): qty 711

## 2020-10-20 MED ORDER — BETHANECHOL CHLORIDE 10 MG PO TABS
30.0000 mg | ORAL_TABLET | Freq: Three times a day (TID) | ORAL | Status: DC
  Administered 2020-10-20 – 2020-10-21 (×2): 30 mg
  Filled 2020-10-20 (×2): qty 3

## 2020-10-20 NOTE — Progress Notes (Addendum)
Modified Barium Swallow Progress Note  Patient Details  Name: Walter [name] MRN: [id number] Date of Birth: 11-Jul-1955  Today's Date: 10/20/2020  Modified Barium Swallow completed.  Full report located under Chart Review in the Imaging Section.  Brief recommendations include the following:  Clinical Impression   Patient was tested with PMSV in place.  He presents with moderate oral and funcitonal pharyngeal swallow ability.  He was sleeping in chair prior to MBS but did wake adequately to accept po intake and held cup with left hand and brought to mouth.  Lingual weakness and discoordination due to hypoglossal nerve deficit results in premature spillage of liquids and delayed oral transiting with puree/cracker bolus.  Pt's pharyngeal swallow triggers at vallecula with boluses and was overall strong. NO aspiration or penetration observed. Pt masticated tablet despite cues to swallow whole - due to his cognition.  Largest aspiration/malnutrition risk is due to oral deficits and his lethargy. Encourage calorie count to assure he is meeting nutritional needs and assess for oral retention after po.  Will follow up for dysphagia, dysarthria and cognitive linguistic treatment.     Swallow Evaluation Recommendations       SLP Diet Recommendations: Dysphagia 1 (Puree) solids;Thin liquid   Liquid Administration via: Straw;Cup   Medication Administration: Crushed with puree   Supervision: Full supervision/cueing for compensatory strategies;Staff to assist with self feeding Start po intake with liquids, Clear oral pocketing and oral suction after po   Compensations: Slow rate;Small sips/bites   Postural Changes: Remain semi-upright after after feeds/meals (Comment);Seated upright at 90 degrees   Oral Care Recommendations: Oral care QID   Other Recommendations: Have oral suction available;Place PMSV during PO intake;Clarify dietary restrictions   Rolena Infante, MS [facility] SLP Acute Rehab  Services Office [phone] Pager [phone]   Chales Abrahams 10/20/2020,9:01 AM

## 2020-10-20 NOTE — Progress Notes (Signed)
Physical Therapy Treatment Patient Details Name: Walter [name] MRN: [id number] DOB: [birth year] Today's Date: 10/20/2020    History of Present Illness 65 yo male presenting 3/28 with R-sided weakness and slurred speech. Imaging revealed acute hemorrhage centered within the left dorsal midbrain extending into the dorsal pons and inferomedial thalamus with intraventricular extension. EVD placement for developing hydrocephalus on 3/29. Extubated 3/30. Pt with noted decrease in EVD drainage overnight 3/31, proximal and distal clots noted, flushed, repeat CT shows significant clot in R lateral ventricle. EVD with another episode of clotting overnight 4/2, intraventricular TpA administered. Reintubated 4/4. 4/6 tracheostomy. 4/10 Doppler shows bilateral DVT. IVC filter placed. CT at chest showing bilateral (R>L) PEs on 4/19. PMH Afib, COVID infection (July 2021), and retinal detachment.    PT Comments    The pt was able to demo great progress with sustained alertness, participation, and mobility at this session. The pt continued to require totalA of 2 to complete transition to sitting EOB, but was able to progress from maxA to minG with cues to maintain static sitting. The pt was able to demo good initiation of movement to commands with all extremities, and was attempting to perform self-care with RUE this session. The pt was able to tolerate x2 attempts of sit-stand from EOB, but did require totalA of 2 to manage stability, blocking of bilateral knees, and facilitation of hip extension with bed pad. Pt unable to complete full stand at this time, but remains highly motivated. Updated recommendation due to significant progress with therapy and mobility this session, will continue to benefit from skilled therapies to progress functional strength, motor planning, control, stability, and endurance to facilitate maximal return of function and independence.     Follow Up Recommendations  CIR     Equipment  Recommendations  Hospital bed (lift)    Recommendations for Other Services Rehab consult     Precautions / Restrictions Precautions Precautions: Fall Precaution Comments: flexiseal Required Braces or Orthoses: Other Brace Other Brace: BUE resting hand splints, see schedule Restrictions Weight Bearing Restrictions: No    Mobility  Bed Mobility Overal bed mobility: Needs Assistance Bed Mobility: Supine to Sit;Sit to Sidelying;Rolling Rolling: +2 for physical assistance;Max assist Sidelying to sit: Total assist;+2 for physical assistance   Sit to supine: Max assist;+2 for physical assistance   General bed mobility comments: assist to move BLE to EOB, pt making efforts to move but requireing assist to complete movement, totalA to elevate trunk and maxA to maintain seated balance. pt able to initiate return to supine but needing assist for BLE top return to bed    Transfers Overall transfer level: Needs assistance Equipment used: 2 person hand held assist Transfers: Sit to/from Stand Sit to Stand: Total assist;+2 physical assistance         General transfer comment: pt lifting feet off floor when attempting to stand, poor hip extension or trunk extension even with totalA at hips with bed pad. attempted x2 with BUE support and Bilat knee blocking  Ambulation/Gait             General Gait Details: pt unable to achieve       Modified Rankin (Stroke Patients Only) Modified Rankin (Stroke Patients Only) Pre-Morbid Rankin Score: No symptoms Modified Rankin: Severe disability     Balance Overall balance assessment: Needs assistance Sitting-balance support: Feet supported;Bilateral upper extremity supported Sitting balance-Leahy Scale: Poor Sitting balance - Comments: maxA to ming at times, needing cues or assist Postural control: Posterior lean Standing balance  support: Bilateral upper extremity supported Standing balance-Leahy Scale: Zero Standing balance  comment: reliant on BUE support and totalA of 2                            Cognition Arousal/Alertness: Awake/alert Behavior During Therapy: Flat affect Overall Cognitive Status: Difficult to assess Area of Impairment: Orientation;Attention;Following commands;Safety/judgement                 Orientation Level: Disoriented to;Place;Time;Situation Current Attention Level: Focused;Sustained (short duration)   Following Commands: Follows one step commands with increased time;Follows one step commands consistently Safety/Judgement: Decreased awareness of safety;Decreased awareness of deficits     General Comments: pt with improved alertness, attempting to mouth words/answers to all questions through session but difficult to understand. Pt making attempt to follow all commands consistently, despite limitations by weakness. Smiling and making appripriate facial expresions towards wife      Exercises General Exercises - Lower Extremity Ankle Circles/Pumps: AROM;10 reps;Supine Heel Slides: AAROM;Both;10 reps;Supine Other Exercises Other Exercises: Instructed wife in PROM exercises x4 extermities (encouraging pt to do them before they are done for him)--he normally exerecises very day at 5:30 AM. He was able to wiggle toes, and move both ankles, not A'ing much with knees and hips. He should some AROM for hand and wrist on LUE, but none on RUE. PROM UEs WNL    General Comments General comments (skin integrity, edema, etc.): VSS on trach collar, turned down from 5L to 2L in attempt to hear pt better, still unable to hear pt attempt at verbalization, but SpO2 remained in 90s. rash noted under BP cuff on RUE, RN notified      Pertinent Vitals/Pain Pain Assessment: Faces Faces Pain Scale: Hurts a little bit Pain Location: some grimacing with rolling, but reporting no pain when asked Pain Descriptors / Indicators: Grimacing Pain Intervention(s): Repositioned;Monitored during  session           PT Goals (current goals can now be found in the care plan section) Acute Rehab PT Goals Patient Stated Goal: Unstated PT Goal Formulation: With patient Time For Goal Achievement: 10/30/20 Potential to Achieve Goals: Fair Progress towards PT goals: Progressing toward goals    Frequency    Min 4X/week      PT Plan Discharge plan needs to be updated    Co-evaluation PT/OT/SLP Co-Evaluation/Treatment: Yes Reason for Co-Treatment: Complexity of the patient's impairments (multi-system involvement);For patient/therapist safety;To address functional/ADL transfers PT goals addressed during session: Mobility/safety with mobility;Balance;Strengthening/ROM        AM-PAC PT "6 Clicks" Mobility   Outcome Measure  Help needed turning from your back to your side while in a flat bed without using bedrails?: Total Help needed moving from lying on your back to sitting on the side of a flat bed without using bedrails?: Total Help needed moving to and from a bed to a chair (including a wheelchair)?: Total Help needed standing up from a chair using your arms (e.g., wheelchair or bedside chair)?: Total Help needed to walk in hospital room?: Total Help needed climbing 3-5 steps with a railing? : Total 6 Click Score: 6    End of Session Equipment Utilized During Treatment: Gait belt;Oxygen Activity Tolerance: Patient tolerated treatment well Patient left: in bed;with call bell/phone within reach;with bed alarm set;with family/visitor present Nurse Communication: Mobility status (rash under BP cuff) PT Visit Diagnosis: Hemiplegia and hemiparesis;Other abnormalities of gait and mobility (R26.89) Hemiplegia - Right/Left: Right Hemiplegia -  dominant/non-dominant: Dominant Hemiplegia - caused by: Nontraumatic intracerebral hemorrhage     Time: [id number] PT Time Calculation (min) (ACUTE ONLY): 34 min  Charges:  $Therapeutic Exercise: 8-22 mins $Therapeutic Activity: 8-22  mins                     Rolm Baptise, PT, DPT   Acute Rehabilitation Department Pager #: [phone]   Gaetana Michaelis 10/20/2020, 12:16 PM

## 2020-10-20 NOTE — Progress Notes (Signed)
Occupational Therapy Treatment Patient Details Name: Walter [name] MRN: [id number] DOB: 14-Aug-1955 Today's Date: 10/20/2020    History of present illness 65 yo male presenting 3/28 with R-sided weakness and slurred speech. Imaging revealed acute hemorrhage centered within the left dorsal midbrain extending into the dorsal pons and inferomedial thalamus with intraventricular extension. EVD placement for developing hydrocephalus on 3/29. Extubated 3/30. Pt with noted decrease in EVD drainage overnight 3/31, proximal and distal clots noted, flushed, repeat CT shows significant clot in R lateral ventricle. EVD with another episode of clotting overnight 4/2, intraventricular TpA administered. Reintubated 4/4. 4/6 tracheostomy. 4/10 Doppler shows bilateral DVT. IVC filter placed. CT at chest showing bilateral (R>L) PEs on 4/19. PMH Afib, COVID infection (July 2021), and retinal detachment.   OT comments  This 65 yo male seen for a second time today so that PT and I could work on him sitting EOB and sit<>stand since he "woke up" towards the end of my first session. He did really well with working on sitting balance, following commands more, trying to communicate, washing his face, and trying sit>stands. He will continue to benefit from acute OT with follow up at on CIR now recommended due pt progress seen today.  Follow Up Recommendations  CIR;Supervision/Assistance - 24 hour    Equipment Recommendations  Wheelchair cushion (measurements OT);Wheelchair (measurements OT);Hospital bed    Recommendations for Other Services Rehab consult    Precautions / Restrictions Precautions Precautions: Fall Precaution Comments: flexiseal Required Braces or Orthoses: Other Brace Other Brace: BUE resting hand splints, see schedule Restrictions Weight Bearing Restrictions: No       Mobility Bed Mobility Overal bed mobility: Needs Assistance Bed Mobility: Supine to Sit;Sit to Sidelying;Rolling Rolling: +2  for physical assistance;Max assist Sidelying to sit: Total assist;+2 for physical assistance   Sit to supine: Max assist;+2 for physical assistance   General bed mobility comments: assist to move BLE to EOB, pt making efforts to move but requireing assist to complete movement, totalA to elevate trunk and maxA to maintain seated balance. pt able to initiate return to supine but needing assist for BLE top return to bed    Transfers Overall transfer level: Needs assistance Equipment used: 2 person hand held assist Transfers: Sit to/from Stand Sit to Stand: Total assist;+2 physical assistance         General transfer comment: pt lifting feet off floor when attempting to stand, poor hip extension or trunk extension even with totalA at hips with bed pad. attempted x2 with BUE support and Bilat knee blocking    Balance Overall balance assessment: Needs assistance Sitting-balance support: Feet supported;Bilateral upper extremity supported Sitting balance-Leahy Scale: Poor Sitting balance - Comments: maxA to ming at times, needing cues or assist Postural control: Posterior lean Standing balance support: Bilateral upper extremity supported Standing balance-Leahy Scale: Zero Standing balance comment: reliant on BUE support and totalA of 2                           ADL either performed or assessed with clinical judgement   ADL Overall ADL's : Needs assistance/impaired     Grooming: Wash/dry face;Bed level;Minimal assistance Grooming Details (indicate cue type and reason): Second session today, pt more awake; pt kept trying to use RUE to do this as well, but needed more hand over hand A for this arm, where as with the LUE he was able wash face.  Vision Baseline Vision/History: Wears glasses Wears Glasses: At all times Additional Comments: Pt looking to left and right while supine and in sitting when spoken to from different  directions          Cognition Arousal/Alertness: Awake/alert Behavior During Therapy: Flat affect Overall Cognitive Status: Difficult to assess Area of Impairment: Orientation;Attention;Following commands;Safety/judgement                 Orientation Level: Disoriented to;Place;Time;Situation Current Attention Level: Focused;Sustained (short duration)   Following Commands: Follows one step commands with increased time;Follows one step commands consistently Safety/Judgement: Decreased awareness of safety;Decreased awareness of deficits     General Comments: pt with improved alertness, attempting to mouth words/answers to all questions through session but difficult to understand. Pt making attempt to follow all commands consistently, despite limitations by weakness. Smiling and making appripriate facial expresions towards wife        Exercises Exercises: General Lower Extremity General Exercises - Lower Extremity Ankle Circles/Pumps: AROM;10 reps;Supine Heel Slides: AAROM;Both;10 reps;Supine Other Exercises Other Exercises: Instructed wife in PROM exercises x4 extermities (encouraging pt to do them before they are done for him)--he normally exerecises very day at 5:30 AM. He was able to wiggle toes, and move both ankles, not A'ing much with knees and hips. He should some AROM for hand and wrist on LUE, but none on RUE. PROM UEs WNL      General Comments VSS on trach collar, turned down from 5L to 2L in attempt to hear pt better, still unable to hear pt attempt at verbalization, but SpO2 remained in 90s. rash noted under BP cuff on RUE, RN notified    Pertinent Vitals/ Pain       Pain Assessment: Faces Faces Pain Scale: Hurts a little bit Pain Location: some grimacing with rolling, but reporting no pain when asked Pain Descriptors / Indicators: Grimacing Pain Intervention(s): Repositioned;Monitored during session         Frequency  Min 2X/week        Progress Toward  Goals  OT Goals(current goals can now be found in the care plan section)  Progress towards OT goals: Progressing toward goals  Acute Rehab OT Goals Patient Stated Goal: he kept trying to talk to [place], but unable to understand due to low tone of voice OT Goal Formulation: With family Time For Goal Achievement: 10/27/20 Potential to Achieve Goals: Fair  Plan Discharge plan remains appropriate    Co-evaluation    PT/OT/SLP Co-Evaluation/Treatment: Yes Reason for Co-Treatment: Complexity of the patient's impairments (multi-system involvement);To address functional/ADL transfers;For patient/therapist safety PT goals addressed during session: Mobility/safety with mobility;Balance;Strengthening/ROM OT goals addressed during session: Strengthening/ROM;ADL's and self-care      AM-PAC OT "6 Clicks" Daily Activity     Outcome Measure   Help from another person eating meals?: Total Help from another person taking care of personal grooming?: A Lot Help from another person toileting, which includes using toliet, bedpan, or urinal?: Total Help from another person bathing (including washing, rinsing, drying)?: Total Help from another person to put on and taking off regular upper body clothing?: Total Help from another person to put on and taking off regular lower body clothing?: Total 6 Click Score: 7    End of Session Equipment Utilized During Treatment:  (trach collar)  OT Visit Diagnosis: Unsteadiness on feet (R26.81);Other abnormalities of gait and mobility (R26.89);Muscle weakness (generalized) (M62.81);Hemiplegia and hemiparesis Hemiplegia - Right/Left: Right Hemiplegia - dominant/non-dominant: Dominant Hemiplegia - caused by: Cerebral infarction  Activity Tolerance Patient tolerated treatment well   Patient Left in bed;with call bell/phone within reach;with family/visitor present   Nurse Communication  (RN in room during last part of session)        Time: [phone] OT Time  Calculation (min): 36 min  Charges: OT General Charges $OT Visit: 1 Visit OT Treatments $Therapeutic Exercise: 23-37 mins  Ignacia Palma, OTR/L Acute [employer] Pager [phone] Office [phone]      Evette Georges 10/20/2020, 2:16 PM

## 2020-10-20 NOTE — Progress Notes (Signed)
ANTICOAGULATION CONSULT NOTE   Pharmacy Consult for IV Heparin Indication: DVT + PE   Allergies  Allergen Reactions  . Keppra [Levetiracetam] Swelling    Patient experienced angioedema post inpatient keppra dose  . Latex Itching    Skin turns real red    Patient Measurements: Height: 6\' 2"  (188 cm) Weight: 96.1 kg (211 lb 13.8 oz) IBW/kg (Calculated) : 82.2 Heparin Dosing Weight: 97 kg  Vital Signs: Temp: 100.1 F (37.8 C) (04/22 0400) Temp Source: Oral (04/22 0400) BP: 114/68 (04/22 0500) Pulse Rate: 102 (04/22 0500)  Labs: Recent Labs    10/18/20 0734 10/18/20 1535 10/19/20 0038 10/19/20 1001 10/19/20 1734 10/19/20 2242 10/20/20 0427  HGB 10.2*  --  9.7*  --   --   --  9.9*  HCT 32.4*  --  31.0*  --   --   --  32.4*  PLT 128*  --  140*  --   --   --  156  HEPARINUNFRC <0.10*   < > 0.18* 0.26* 0.21*  --  0.38  CREATININE 1.38*  --  1.25*  --   --  1.04  --   CKTOTAL 97  --   --   --   --   --   --    < > = values in this interval not displayed.    Estimated Creatinine Clearance: 82.3 mL/min (by C-G formula based on SCr of 1.04 mg/dL).   Assessment: 65 yr old man presented with an intraparenchymal hemorrhage and was later found to have a DVT. CT chest on 4/29 showed new bilateral PE. Given his recent ICH, he received an IVC filter, but is now starting therapeutic anticoag with IV heparin, per neurology, with NO BOLUS.  Heparin level ~6 hrs after increasing heparin infusion to 2050 units/hr was 0.21 units/ml, which is lower than last heparin level (0.26 units/ml), despite heparin infusion having been increased. H/H 9.7/31.0 (slight drop with AM labs), plt 140. Per RN, no issues with IV or bleeding observed.  4/22 AM update:  Heparin level therapeutic after rate increase  Goal of Therapy:  Heparin level 0.3-0.5 units/ml Monitor platelets by anticoagulation protocol: Yes   Plan:  Cont heparin infusion at 2150 units/hr Check heparin level in 6-8 hrs Monitor  daily heparin level, CBC Monitor for bleeding  2151, PharmD, BCPS Clinical Pharmacist Phone: [phone]

## 2020-10-20 NOTE — Progress Notes (Signed)
ANTICOAGULATION CONSULT NOTE   Pharmacy Consult for heparin>>Apixaban Indication: DVT + PE   Allergies  Allergen Reactions  . Keppra [Levetiracetam] Swelling    Patient experienced angioedema post inpatient keppra dose  . Latex Itching    Skin turns real red    Patient Measurements: Height: 6\' 2"  (188 cm) Weight: 96.1 kg (211 lb 13.8 oz) IBW/kg (Calculated) : 82.2 Heparin Dosing Weight: 97 kg  Vital Signs: Temp: 99.5 F (37.5 C) (04/22 1600) Temp Source: Axillary (04/22 1600) BP: 119/61 (04/22 1700) Pulse Rate: 79 (04/22 1700)  Labs: Recent Labs    10/18/20 0734 10/18/20 1535 10/19/20 0038 10/19/20 1001 10/19/20 1734 10/19/20 2242 10/20/20 0427 10/20/20 1151  HGB 10.2*  --  9.7*  --   --   --  9.9*  --   HCT 32.4*  --  31.0*  --   --   --  32.4*  --   PLT 128*  --  140*  --   --   --  156  --   HEPARINUNFRC <0.10*   < > 0.18*   < > 0.21*  --  0.38 0.36  CREATININE 1.38*  --  1.25*  --   --  1.04 0.97  --   CKTOTAL 97  --   --   --   --   --   --   --    < > = values in this interval not displayed.    Estimated Creatinine Clearance: 88.3 mL/min (by C-G formula based on SCr of 0.97 mg/dL).   Assessment: 1 YOM who presented to the hospital with an intraparenchymal hemorrhage later found to have a DVT. CT chest on 4/29 with new bilateral PE. Given his recent ICH, he received an IVC filter but now starting therapeutic anticoag per neurology with NO BOLUS  Pharmacy consulted to convert to apixaban. Given recent ICH, Dr. 5/29 does not want to load with apixaban.   Plan:  Start apixaban 5 mg po bid D/c heparin drip after first dose of apixaban given Monitor for signs and symptoms of bleeding.  Roda Shutters, PharmD, [facility] Clinical Pharmacist Please see AMION for all Pharmacists' Contact Phone Numbers 10/20/2020, 6:41 PM

## 2020-10-20 NOTE — Progress Notes (Signed)
STROKE TEAM PROGRESS NOTE   INTERVAL HISTORY RNs are at bedside.  Per RN report, patient this morning very weak alert, follow simple commands, has passed a swallow, did very well during the modified barium study, currently on dysphagia 1 and thin liquid.  However, will keep nocturnal tube feeding for supplement.  Patient put off his core track overnight, new core track tube placed this afternoon.  Since there is no need of PEG tube, will change heparin IV to Eliquis.  Currently on Unasyn the last day.  Had 100.1 overnight temperature, may be related to DVT/PE.  This afternoon, on my rounding, patient drowsy sleepy, seem to be worn out, not quite awake alert and follow commands.   Vitals:   10/20/20 1512 10/20/20 1600 10/20/20 1700 10/20/20 1800  BP: 130/70 125/62 119/61 119/62  Pulse: 80 (!) 105 79 75  Resp: 19 (!) 23 16 20   Temp:  99.5 F (37.5 C)    TempSrc:  Axillary    SpO2: 94% 100% 93% 92%  Weight:      Height:       CBC:  Recent Labs  Lab 10/19/20 0038 10/20/20 0427  WBC 13.0* 10.7*  HGB 9.7* 9.9*  HCT 31.0* 32.4*  MCV 100.0 102.9*  PLT 140* 156   Basic Metabolic Panel:  Recent Labs  Lab 10/18/20 0734 10/19/20 0038 10/19/20 2242 10/20/20 0427  NA 138   < > 145 147*  K 3.9   < > 4.1 3.8  CL 101   < > 107 109  CO2 26   < > 29 28  GLUCOSE 243*   < > 120* 124*  BUN 59*   < > 46* 37*  CREATININE 1.38*   < > 1.04 0.97  CALCIUM 8.5*   < > 8.5* 8.7*  MG 3.0*  --  2.9*  --    < > = values in this interval not displayed.    IMAGING  CT head 4/6 1. Ventricle size shows mild interval enlargement since yesterday. No significant hydrocephalus. Intraventricular hemorrhage stable 2. Right frontal ventricular drain unchanged. Hemorrhage surrounding the drain unchanged. 3. Subarachnoid hemorrhage unchanged. Hemorrhage in the left tectum unchanged. No new hemorrhage identified.  CT head 4/4 1. Interval decrease in intraventricular hemorrhage with decreased size of the  lateral ventricles as compared to prior, right greater than left. Right parietal approach ventriculostomy in stable position with tip near the septum pellucidum. 2. Stable small volume subarachnoid and intraparenchymal hemorrhage along the catheter tract. 3. Stable 1.9 cm hemorrhage at the left dorsal pons. 4. No other new acute intracranial abnormality. 09/29/2020  CT head 4/3 IMPRESSION: 1. New intraventricular hemorrhage in the third and lateral ventricles. Fourth ventricular and midbrain hemorrhage is unchanged. 2. New right frontal EVD which traverses the right lateral ventricle. Blood clot encompasses the lower catheter and ventriculomegaly is similar to prior. 3. Small cortical infarcts are newly seen along the superior left frontal convexity.  09/26/2020 IR Angio IMPRESSION: 1. No evidence of an AVM, dural arteriovenous fistula, aneurysm or other vascular abnormality to explain patient's brainstem hemorrhage.  09/26/2020 CT head:  IMPRESSION: 1. Unchanged left brainstem hemorrhage extending into the fourth ventricle. 2. Worsening hydrocephalus.  09/25/2020 MRI brain w/wo, MR Venogram IMPRESSION: 1. Acute hemorrhage centered within the left dorsal midbrain extending into the dorsal pons and inferomedial thalamus with intraventricular extension. No abnormal enhancement to suggest underlying lesion. 2. Compression of the cerebral aqueduct with possible mild obstructive hydrocephalus. 3. No evidence of dural sinus  thrombosis. Attenuation of the left basal vein in the quadrigeminal cistern without definite occlusion.  09/25/2020 CT Angio head/neck IMPRESSION: 1. No vascular malformation, anomaly or aneurysm. 2. No emergent large vessel occlusion or high-grade stenosis of the intracranial arteries. 3. Unchanged size of dorsal left pontine hemorrhage with intraventricular extension.  09/25/2020 CT head code stroke IMPRESSION: Acute intraparenchymal hemorrhage in  the dorsal left pons with subarachnoid extension into the cerebral aqueduct and fourth Ventricle.  EEG-LTM 4/5-4/6  This study showed periodic discharges with triphasic morphology at 1.5 to 2.5 Hz which is on the ictal-interictal continuum with low to intermediate potential for seizures.  Additionally there is evidence of moderate to severe diffuse encephalopathy, nonspecific etiology.  No seizures were seen during the study  EEG adult 10/13/2020 This study is suggestive of moderate diffuse encephalopathy, nonspecific etiology. No seizures or definite epileptiform discharges were seen throughout the recording. Walter [name]   CT HEAD WO CONTRAST  Result Date: 10/17/2020 CLINICAL DATA:  Follow-up intracranial hemorrhage EXAM: CT HEAD WITHOUT CONTRAST TECHNIQUE: Contiguous axial images were obtained from the base of the skull through the vertex without intravenous contrast. COMPARISON:  10/11/2020 FINDINGS: Brain: Intraparenchymal hemorrhage in the left side the upper pons and midbrain no longer shows any hyperdense components. There is an intra-axial cystic space measuring up to 15 mm in diameter, which may communicate with the fourth ventricle. No focal cerebellar abnormality is seen. Continuing diminishing density of blood along the ventriculostomy track in the right frontal region. Persistent brain edema surrounding that. Small amount of subarachnoid blood possibly subdural blood the ventriculostomy entry site appears similar. No evidence of any new or increased bleeding. Small cortical infarctions of the left frontoparietal vertex are barely appreciable. No evidence of new brain infarction. Ventricular size remains stable without evidence of ongoing aqueductal obstruction. Previously seen hyperdense blood dependent within the occipital horns is diminishing. Vascular: No vascular finding. Skull: Normal other than the right frontal burr hole. Sinuses/Orbits: Paranasal sinuses are clear. Orbits are  normal. No fluid in the middle ears. Small mastoid effusions. Other: None IMPRESSION: 1. Continuing diminishing density of blood along the ventriculostomy track in the right frontal region. No evidence of any new or increased bleeding. Diminishing dependent intraventricular blood in the lateral ventricles. Very small amount of subarachnoid blood and possibly subdural blood at the ventriculostomy entry site appears similar. 2. Small cortical infarctions of the left frontoparietal vertex are barely appreciable. No evidence of new or increased infarction. 3. Ventricular size remains stable without evidence of ongoing aqueductal obstruction. 4. 15 mm intra-axial cystic space in the left side of the upper pons and midbrain at the site of the previous hematoma. This may communicate with the fourth ventricle. Electronically Signed   By: Paulina FusiMark  Shogry M.D.   On: 10/17/2020 07:35   CT HEAD WO CONTRAST  Result Date: 10/17/2020 CLINICAL DATA:  Follow-up intracranial hemorrhage EXAM: CT HEAD WITHOUT CONTRAST TECHNIQUE: Contiguous axial images were obtained from the base of the skull through the vertex without intravenous contrast. COMPARISON:  10/11/2020 FINDINGS: Brain: Intraparenchymal hemorrhage in the left side the upper pons and midbrain no longer shows any hyperdense components. There is an intra-axial cystic space measuring up to 15 mm in diameter, which may communicate with the fourth ventricle. No focal cerebellar abnormality is seen. Continuing diminishing density of blood along the ventriculostomy track in the right frontal region. Persistent brain edema surrounding that. Small amount of subarachnoid blood possibly subdural blood the ventriculostomy entry site appears similar. No evidence  of any new or increased bleeding. Small cortical infarctions of the left frontoparietal vertex are barely appreciable. No evidence of new brain infarction. Ventricular size remains stable without evidence of ongoing aqueductal  obstruction. Previously seen hyperdense blood dependent within the occipital horns is diminishing. Vascular: No vascular finding. Skull: Normal other than the right frontal burr hole. Sinuses/Orbits: Paranasal sinuses are clear. Orbits are normal. No fluid in the middle ears. Small mastoid effusions. Other: None IMPRESSION: 1. Continuing diminishing density of blood along the ventriculostomy track in the right frontal region. No evidence of any new or increased bleeding. Diminishing dependent intraventricular blood in the lateral ventricles. Very small amount of subarachnoid blood and possibly subdural blood at the ventriculostomy entry site appears similar. 2. Small cortical infarctions of the left frontoparietal vertex are barely appreciable. No evidence of new or increased infarction. 3. Ventricular size remains stable without evidence of ongoing aqueductal obstruction. 4. 15 mm intra-axial cystic space in the left side of the upper pons and midbrain at the site of the previous hematoma. This may communicate with the fourth ventricle. Electronically Signed   By: Paulina Fusi M.D.   On: 10/17/2020 07:35   CT CHEST W CONTRAST  Result Date: 10/17/2020 CLINICAL DATA:  Fever of unknown origin. EXAM: CT CHEST, ABDOMEN, AND PELVIS WITH CONTRAST TECHNIQUE: Multidetector CT imaging of the chest, abdomen and pelvis was performed following the standard protocol during bolus administration of intravenous contrast. CONTRAST:  OMNIPAQUE IOHEXOL 300 MG/ML  SOLN COMPARISON:  Plain film 10/17/2020.  No prior CTs. FINDINGS: CT CHEST FINDINGS Cardiovascular: Aortic atherosclerosis. Normal heart size, without pericardial effusion. Bilateral pulmonary emboli are identified on this non dedicated exam. Example in the central right pulmonary artery on 28/3. Branch pulmonary arteries to both upper and lower lobes on 32/3. Within the left upper lobe pulmonary artery branch on 20/3. Mediastinum/Nodes: No mediastinal or hilar  adenopathy. Lungs/Pleura: No pleural fluid. Endotracheal tube terminates appropriately. Areas of mild peripheral predominant airspace and ground-glass are indeterminate. Example at the left apex on 22/5 and right lower lobe on 58/5. More dependent bibasilar airspace opacities are likely due to atelectasis. Musculoskeletal: No acute osseous abnormality. CT ABDOMEN PELVIS FINDINGS Hepatobiliary: Mild hepatomegaly at 18.4 cm craniocaudal. Normal gallbladder, without biliary ductal dilatation. Pancreas: Normal, without mass or ductal dilatation. Spleen: Normal in size, without focal abnormality. Adrenals/Urinary Tract: Normal adrenal glands. Bilateral renal sinus cysts, without hydronephrosis. Foley catheter within the urinary bladder. Stomach/Bowel: Nasogastric tube terminating at the gastric antrum. Rectal catheter in place. Scattered colonic diverticula. Normal terminal ileum and appendix. Normal small bowel. Vascular/Lymphatic: Aortic atherosclerosis. IVC filter is appropriately positioned. No abdominopelvic adenopathy. Reproductive: Mild prostatomegaly. Other: No significant free fluid. No free intraperitoneal air. Mild pelvic anasarca. tiny bilateral fat containing inguinal hernias. Musculoskeletal: Degenerative partial fusion of bilateral sacroiliac joints in. Degenerate disc disease at the lumbosacral junction. IMPRESSION: 1. Bilateral, large volume right greater than left pulmonary emboli on this nondedicated exam. No evidence of right heart strain. 2. Mild peripheral ground-glass opacities, favoring atelectasis or infarct. Atypical infection felt less likely. 3. Otherwise, no explanation for fever. 4.  Aortic Atherosclerosis (ICD10-I70.0). 5. Prostatomegaly. These results will be called to the ordering clinician or representative by the Radiologist Assistant, and communication documented in the PACS or [employer]. Electronically Signed   By: Jeronimo Greaves M.D.   On: 10/17/2020 14:43   CT ABDOMEN PELVIS  W CONTRAST  Result Date: 10/17/2020 CLINICAL DATA:  Fever of unknown origin. EXAM: CT CHEST, ABDOMEN, AND  PELVIS WITH CONTRAST TECHNIQUE: Multidetector CT imaging of the chest, abdomen and pelvis was performed following the standard protocol during bolus administration of intravenous contrast. CONTRAST:  OMNIPAQUE IOHEXOL 300 MG/ML  SOLN COMPARISON:  Plain film 10/17/2020.  No prior CTs. FINDINGS: CT CHEST FINDINGS Cardiovascular: Aortic atherosclerosis. Normal heart size, without pericardial effusion. Bilateral pulmonary emboli are identified on this non dedicated exam. Example in the central right pulmonary artery on 28/3. Branch pulmonary arteries to both upper and lower lobes on 32/3. Within the left upper lobe pulmonary artery branch on 20/3. Mediastinum/Nodes: No mediastinal or hilar adenopathy. Lungs/Pleura: No pleural fluid. Endotracheal tube terminates appropriately. Areas of mild peripheral predominant airspace and ground-glass are indeterminate. Example at the left apex on 22/5 and right lower lobe on 58/5. More dependent bibasilar airspace opacities are likely due to atelectasis. Musculoskeletal: No acute osseous abnormality. CT ABDOMEN PELVIS FINDINGS Hepatobiliary: Mild hepatomegaly at 18.4 cm craniocaudal. Normal gallbladder, without biliary ductal dilatation. Pancreas: Normal, without mass or ductal dilatation. Spleen: Normal in size, without focal abnormality. Adrenals/Urinary Tract: Normal adrenal glands. Bilateral renal sinus cysts, without hydronephrosis. Foley catheter within the urinary bladder. Stomach/Bowel: Nasogastric tube terminating at the gastric antrum. Rectal catheter in place. Scattered colonic diverticula. Normal terminal ileum and appendix. Normal small bowel. Vascular/Lymphatic: Aortic atherosclerosis. IVC filter is appropriately positioned. No abdominopelvic adenopathy. Reproductive: Mild prostatomegaly. Other: No significant free fluid. No free intraperitoneal air. Mild  pelvic anasarca. tiny bilateral fat containing inguinal hernias. Musculoskeletal: Degenerative partial fusion of bilateral sacroiliac joints in. Degenerate disc disease at the lumbosacral junction. IMPRESSION: 1. Bilateral, large volume right greater than left pulmonary emboli on this nondedicated exam. No evidence of right heart strain. 2. Mild peripheral ground-glass opacities, favoring atelectasis or infarct. Atypical infection felt less likely. 3. Otherwise, no explanation for fever. 4.  Aortic Atherosclerosis (ICD10-I70.0). 5. Prostatomegaly. These results will be called to the ordering clinician or representative by the Radiologist Assistant, and communication documented in the PACS or [employer]. Electronically Signed   By: Jeronimo Greaves M.D.   On: 10/17/2020 14:43     PHYSICAL EXAM  Temp:  [98.5 F (36.9 C)-100.1 F (37.8 C)] 99.5 F (37.5 C) (04/22 1600) Pulse Rate:  [72-105] 75 (04/22 1800) Resp:  [16-34] 20 (04/22 1800) BP: (101-139)/(60-89) 119/62 (04/22 1800) SpO2:  [91 %-100 %] 92 % (04/22 1800) FiO2 (%):  [21 %-28 %] 21 % (04/22 1512) Weight:  [96.1 kg] 96.1 kg (04/22 0400)  General - Well nourished, well developed, on trach collar  Ophthalmologic - fundi not visualized due to noncooperation.  Cardiovascular - Regular rate and rhythm, not in afib.  Neuro - on trach collar now, still has copious secretions, eyes closed but open on voice, not quite follow commands as being worn out throughout the day.With eye opening, left eye in mid position, and right in slight upward position.  Not blinking to visual threat, no obvious facial droop, PERRL. Tongue mildline.  Not follow commands to move extremity, however with painful stimuli, withdrawing all extremities left more than right. DTR diminished and no babinski. Sensation, coordination not cooperative and gait not tested.   ASSESSMENT/PLAN Mr. CARLEY COCKERELL is a 65 y.o. male with history of atrial flutter newly diagnosed  with atrial fibrillation in February 2022 not on anticoagulation, follows with Dr. Johney Frame in cardiology.  He presented with right-sided weakness, slurred speech, headache, and vomiting.Last known well when he went to bed at 10 PM on 09/24/2020 and woke up at around 2 AM  on the day of admission with slurred speech.  EMS was called.He was very drowsy upon their assessment and notable right side weakeness.  He had worsening mental status, GCS 5, so was emergently intubated in ED for airway protection.  His MRI brain showed acute hemorrhage at left midbrain extending into pons with IVH associated with obstructive hydrocephalus.    ICH - Left Dorsalmidbrain ICHextending into dorsal pons and inferior thalamus with mild mass-effect on third ventricle and hydrocephalus,source unknown, suspect occult cavernoma  Code Stroke CT head-Acute IPH in the dorsal left pons with subarachnoid extension into the cerebral aqueduct and fourth ventricle.  CTA head & neck-No vascular malformation, anomaly or aneurysm. No emergent large vessel occlusion or high-grade stenosis. Unchanged size of dorsal left pontine hemorrhage with intraventricular extension.  MRI& MR Venogram-Acute hemorrhage centered within the left dorsal midbrain extending into the dorsal pons and inferomedial thalamus with intraventricular extension. Compression of the cerebral aqueduct with possible mild obstructive hydrocephalus. No evidence of dural sinus thrombosis.   CT Head W/O Contrast 3/29 0250 -Unchanged left brainstem hemorrhage extending into the fourth ventricle. Worsening hydrocephalus.  Diagnostic Cerebral Angiogram - No evidence of an aneurysm, AVM, dural AV fistula or other vascular abnormality to explain patient's known intraparenchymal hemorrhage.  MRI brain 4/8 -stable IVH, midbrain ICH and right frontal EVD tract hemorrhage.  Ventricular size stable.  2-3 punctate infarcts left frontal and parietal lobes, corresponding to  small acute/subacute infarcts.  CT head 4/13 stable IVH, ICH, decreased ventricle size  CT repeat 4/19 continue evolution of ICH and IVH, nearly absorbed now  2D Echo- EF:55-60%. No wall motion abnormalities.  LDL- 88  HgbA1c5.7  VTE prophylaxis - subcu heparin  No antithromboticprior to admission, on heparin IV.  We will switch to Eliquis.  Therapy recommendations:CIR vs LTAC  Disposition:Pending  Obstructive hydrocephalus   EVD placed on 3/30   CT 4/1 showed clot in right lateral ventricle around catheter.   S/p 1mg  (1cc) alteplase placed in EVD 09/29/20 -> drain well ->ICP measuring 4-11 with a good waveform  CT head (4/3) resolution of most of the IVH and improved hydrocephalus.  EVD stopped working 4/2 overnight. 0.5mg  alteplase placed in EVD 10/01/2020 -> Post alteplase CT head new hemorrhage along the EVD tract an in the right lateral ventricle.  CT repeat 4/4 - decreased IVH and decompressed right lateral ventricle  CT repeat 4/5 -mild interval enlargement of ventricular size, no significant hydrocephalus.  IVH stable.  HCT 4/7 stable, with no appreciable change in overall ventricular size  HCT 4/13 decreased ventricular size  CT repeat 4/19 continue evolution of ICH and IVH, nearly absorbed now  4/16 will remove scalp staples.   Acute Respiratory Failure  Required intubation on 4/4 due to worsening MS and hypoxia  S/p trach 10/04/20  Off sedation  On trach collar with improving secretions -> speaking valve  On robitussin PRN   Bilateral DVT Bilateral, large volume right greater than left PE  LE venous Doppler 4/10 with bilateral DVTs  Status post IVC filter  CT chest showed bilateral PE, which I believe related to her DVT found on 4/10, not after IVC filter  On heparin IV, will switch to Eliquis  [facility]-acquired pneumonia with Moraxella Fever   Tmax 100.6->100.4->101.5->101.7->afebrile->100.1  Off arctic  sun  Sputum culture (3/30): Moraxella  Sputum culture 4/5: neg  WBC 9.8->8.5->8.7->8.9->10.6->12.2->13.0->10.7  CXR 4/7, 4/8 left basilar opacities  CXR 4/19 unremarkable  Blood culture 4/8 positive for Acinetobacter  Off cefepime  and vanco -> unasyn (off 4/23)  PICC removed  ID on board - CT abd/pelvis/chest no infection source found  SIRPIDs, resolved Severe encephalopathy, improving  Left UE intermittent jerking with stimulation  LTM EEG: GPEDs with triphasic wave, generalized SIRPIDs, although focal seizure is in DDx  S/p keppra 10/03/20 -> d/c concerning for angioedema -> on vimpat 10/04/20-> off vimpat 10/11/20  EEG 4/4 moderate to severe diffuse encephalopathy   Repeat spot EEG 4/15 moderate diffuse encephalopathy  Angioedema, resolved  Upper lip edema, much improved  Concerning for keppra allergy  Hypertension  Home meds:Diltiazem  Currently on Diltiazem 30 mg q6  off clonidine  BP goal Systolic < 160  Long-term BP goal normotensive  Hx of Aflutter PAF  Not on [name] before admission  S/p CTI ablation for A flutter  off ASA  Not AC candidate at this time.  Afib with RVR intermittent -> sinus now  On heparin IV, will switch to Eliquis  Hyperlipidemia  Home meds:none  LDL88, goal < 70  AST/ALT 56/126->61/136  No statin now due to elevated LFTs  May consider to addstatin on discharge if LFT normalized  Dysphagia  Secondary to hemorrhagic stroke  Passed swallow  On Dys 1 and thin liquid  SLP on board  Tube feeds @ 23ml/hr for nocturnal feeding  Encourage po intake  Calorie count ordered  Other Stroke Risk Factors  Advanced Age >/= 65  Snoring: sleep study denied by insurance prior to admission   Other Active Problems:  Hypokalemia  AKI Cre 0.95->1.14->1.38->1.25, on TF  Lethargic - on amantadine   Wound care consulted for buttock wound due to diarrhea  Hospital day #25  This patient is critically ill  due to ICH, IVH, hydrocephalus, sepsis, fever, s/p trach, dysphagia and at significant risk of neurological worsening, death form sepsis, seizure, obstructive hydrocephalus, PE. This patient's care requires constant monitoring of vital signs, hemodynamics, respiratory and cardiac monitoring, review of multiple databases, neurological assessment, discussion with family, other specialists and medical decision making of high complexity. I spent 40 minutes of neurocritical care time in the care of this patient.  I discussed with Dr. Craige Cotta CCM.   Marvel Plan, MD PhD Stroke Neurology 10/20/2020 7:18 PM

## 2020-10-20 NOTE — Progress Notes (Signed)
Occupational Therapy Treatment Patient Details Name: Walter [name] MRN: [id number] DOB: 02/20/1956 Today's Date: 10/20/2020    History of present illness 65 yo male presenting 3/28 with R-sided weakness and slurred speech. Imaging revealed acute hemorrhage centered within the left dorsal midbrain extending into the dorsal pons and inferomedial thalamus with intraventricular extension. EVD placement for developing hydrocephalus on 3/29. Extubated 3/30. Pt with noted decrease in EVD drainage overnight 3/31, proximal and distal clots noted, flushed, repeat CT shows significant clot in R lateral ventricle. EVD with another episode of clotting overnight 4/2, intraventricular TpA administered. Reintubated 4/4. 4/6 tracheostomy. 4/10 Doppler shows bilateral DVT. IVC filter placed. CT at chest showing bilateral (R>L) PEs on 4/19. PMH Afib, COVID infection (July 2021), and retinal detachment.   OT comments  This 65 yo male had an MBS earlier this AM and per RN and wife he is worn out. He was quite lethargic during the first ~18 minutes of the session, then it was like a "light switch" and he woke up. Eyes open trying to talk to [place] (passey muir valve on but not a lot of phonation coming through unless you tell him to "yell" then the first word is more understandable but trails off from there. Educated wife on P/AA/AROM for Bil UEs and LEs  Follow Up Recommendations  Supervision/Assistance - 24 hour    Equipment Recommendations  Wheelchair cushion (measurements OT);Wheelchair (measurements OT);Hospital bed;Other (comment)       Precautions / Restrictions Precautions Precautions: Fall Precaution Comments: flexiseal Required Braces or Orthoses: Other Brace Other Brace: BUE resting hand splints, see schedule Restrictions Weight Bearing Restrictions: No              ADL either performed or assessed with clinical judgement   ADL Overall ADL's : Needs assistance/impaired     Grooming: Total  assistance;Wash/dry face                                       Vision Baseline Vision/History: Wears glasses Wears Glasses: At all times Additional Comments: Per wife can't even see his hand in front of his face without his glasses; tending to have head and eyes to left, but can turn both to right when cued and wide awake          Cognition Arousal/Alertness: Lethargic;Awake/alert (initially (~first 18 minutes he was very lethargic) then he woke up and was interacting with wife Lynden Ang) and myself) Behavior During Therapy: Flat affect Overall Cognitive Status: Impaired/Different from baseline Area of Impairment: Following commands;Attention                   Current Attention Level: Focused;Sustained (initally focused--progressed to sustained)   Following Commands: Follows one step commands inconsistently;Follows one step commands with increased time       General Comments: Was able to give a "thumbs up, try and reach up with LUE for wife's hand (mainly moving fingers and wrist), attempting to mouth words last 10 minutes of session        Exercises Other Exercises Other Exercises: Instructed wife in PROM exercises x4 extermities (encouraging pt to do them before they are done for him)--he normally exerecises very day at 5:30 AM. He was able to wiggle toes, and move both ankles, not A'ing much with knees and hips. He should some AROM for hand and wrist on LUE, but none on RUE. PROM UEs WNL  Pertinent Vitals/ Pain       Pain Assessment: Faces Faces Pain Scale: No hurt         Frequency  Min 2X/week        Progress Toward Goals  OT Goals(current goals can now be found in the care plan section)  Progress towards OT goals: Not progressing toward goals - comment (due to initial lethargy)     Plan Discharge plan remains appropriate       AM-PAC OT "6 Clicks" Daily Activity     Outcome Measure   Help from another person eating meals?:  Total Help from another person taking care of personal grooming?: Total Help from another person toileting, which includes using toliet, bedpan, or urinal?: Total Help from another person bathing (including washing, rinsing, drying)?: Total Help from another person to put on and taking off regular upper body clothing?: Total Help from another person to put on and taking off regular lower body clothing?: Total 6 Click Score: 6    End of Session Equipment Utilized During Treatment:  (trach collar)  OT Visit Diagnosis: Unsteadiness on feet (R26.81);Other abnormalities of gait and mobility (R26.89);Muscle weakness (generalized) (M62.81);Hemiplegia and hemiparesis Hemiplegia - Right/Left: Right Hemiplegia - dominant/non-dominant: Dominant Hemiplegia - caused by: Cerebral infarction   Activity Tolerance Patient tolerated treatment well;Patient limited by lethargy (went from lethargy to being awake for session)   Patient Left in bed;with family/visitor present;with nursing/sitter in room (bed in chair position)   Nurse Communication  (RN in room during last part of session)        Time: 9485-4627 OT Time Calculation (min): 26 min  Charges: OT General Charges $OT Visit: 1 Visit OT Treatments $Therapeutic Exercise: 23-37 mins  Ignacia Palma, OTR/L [facility] [employer] Pager [phone] Office [phone]      Evette Georges 10/20/2020, 11:52 AM

## 2020-10-20 NOTE — Progress Notes (Signed)
Patient aspirated on administration of oral Symmetrel at 18:40. Messaged pharmacy to have med switched back to tube route. Patient suctioned and stable.

## 2020-10-20 NOTE — Progress Notes (Signed)
ANTICOAGULATION CONSULT NOTE   Pharmacy Consult for heparin Indication: DVT + PE   Allergies  Allergen Reactions  . Keppra [Levetiracetam] Swelling    Patient experienced angioedema post inpatient keppra dose  . Latex Itching    Skin turns real red    Patient Measurements: Height: 6\' 2"  (188 cm) Weight: 96.1 kg (211 lb 13.8 oz) IBW/kg (Calculated) : 82.2 Heparin Dosing Weight: 97 kg  Vital Signs: Temp: 98.5 F (36.9 C) (04/22 0800) Temp Source: Axillary (04/22 0800) BP: 108/61 (04/22 1000) Pulse Rate: 79 (04/22 1000)  Labs: Recent Labs    10/18/20 0734 10/18/20 1535 10/19/20 0038 10/19/20 1001 10/19/20 1734 10/19/20 2242 10/20/20 0427  HGB 10.2*  --  9.7*  --   --   --  9.9*  HCT 32.4*  --  31.0*  --   --   --  32.4*  PLT 128*  --  140*  --   --   --  156  HEPARINUNFRC <0.10*   < > 0.18* 0.26* 0.21*  --  0.38  CREATININE 1.38*  --  1.25*  --   --  1.04 0.97  CKTOTAL 97  --   --   --   --   --   --    < > = values in this interval not displayed.    Estimated Creatinine Clearance: 88.3 mL/min (by C-G formula based on SCr of 0.97 mg/dL).   Assessment: 37 YOM who presented to the hospital with an intraparenchymal hemorrhage later found to have a DVT. CT chest on 4/29 with new bilateral PE. Given his recent ICH, he received an IVC filter but now starting therapeutic anticoag per neurology with NO BOLUS  Heparin level this morning remains therapeutic (HL 0.36 << 0.38, goal of 0.3-0.5). Hgb/Hct stable - no bleeding or issues noted per RN.   Goal of Therapy:  Heparin level 0.3-0.5 units/ml Monitor platelets by anticoagulation protocol: Yes   Plan:  - Continue Heparin at 2150 units/hr (21.5 ml/hr) - Will continue to monitor for any signs/symptoms of bleeding and will follow up with heparin level in 6 hours   Thank you for allowing pharmacy to be a part of this patient's care.  5/29, PharmD, BCPS Clinical Pharmacist Clinical phone for 10/20/2020:  10/22/2020 10/20/2020 11:17 AM   **Pharmacist phone directory can now be found on [email or web address] ([email or web address]).  Listed under [facility].

## 2020-10-20 NOTE — Procedures (Signed)
Cortrak  Person Inserting Tube:  Callie Facey E, RD Tube Type:  Cortrak - 43 inches Tube Location:  Right nare Initial Placement:  Stomach Secured by: Bridle Technique Used to Measure Tube Placement:  Documented cm marking at nare/ corner of mouth Cortrak Secured At:  70 cm    Cortrak Tube Team Note:  Consult received to place a Cortrak feeding tube.   No x-ray is required. RN may begin using tube.    If the tube becomes dislodged please keep the tube and contact the Cortrak team at [email or web address] ([email or web address]) for replacement.  If after hours and replacement cannot be delayed, place a NG tube and confirm placement with an abdominal x-ray.    Walter Almanzar, MS, RD, LDN RD pager number and weekend/on-call pager number located in Amion.   

## 2020-10-20 NOTE — Progress Notes (Signed)
Inpatient Rehabilitation Admissions Coordinator   I will place rehab consult order to begin discussions of rehab venue options per therapy recommendations today.  Ottie Glazier, RN, MSN Rehab Admissions Coordinator [phone] 10/20/2020 12:27 PM

## 2020-10-20 NOTE — Progress Notes (Signed)
NAME:  Walter [name], MRN:  [id number], DOB:  29-Aug-1955, LOS: 25 ADMISSION DATE:  09/25/2020, CONSULTATION DATE:  09/25/20 REFERRING MD:  Wilford Corner,  CHIEF COMPLAINT:  Headache, r side weakness, slurred speech  History of Present Illness:  Walter [name] is a 65 yo man with hx of afib not on anticoagulation admitted with right-sided weakness, hemorrhagic stroke. Emergently intubated in the ED for airway protection  Pertinent  Medical History  A fib/flutter s/p ablation and cardioversion, Retinal detachment, COVID 19 infection July 2021  Significant Hospital Events: Including procedures, antibiotic start and stop dates in addition to other pertinent events   . 3/28- Admit, intubated.  Not a candidate for TPA due to intracranial hemorrhage.  Started 3% saline . 3/29- EVD placement for developing hydrocephalus. CTA of the head was unremarkable . 3/30- Extubated. . 4/1 respiratory culture grew Moraxella, on ceftriaxone . 4/4 reintubated because of hypoxia and unresponsiveness . 4/6 tracheostomy . 4/7 still encephalopathic  . 4/8 remains encephalopathic . 4/10 Doppler shows bilateral DVT. IVC filter placed.  . 4/11 Blood cultures positive for Acinetobacter  . 4/12 Fever no worse off [place], off sedative infusions, tolerating trach collar.  . 4/13 Febrile @ 7am . 4/14 - 4/17 4/16 Afebrile Intermittently following simple commands and participated in PT, drowsy today . 4/18 febrile. Starting unasyn, sending trach asp . 4/19 T Max 101.7, WBC 12.2, ALT elevated, profoundly deconditioned . 4/20 awake and following commands. Trach aspirate w few GPC.  . 4/21 Tolerated trach collar >24h . 4/22 D1 diet, transfer out of ICU  Interim History / Subjective:  Remains on vent.  D1 diet per speech assessment.  Tm 100.9.  Objective   Blood pressure 119/67, pulse 100, temperature 98.5 F (36.9 C), temperature source Axillary, resp. rate (!) 25, height 6\' 2"  (1.88 m), weight 96.1 kg, SpO2 97 %.    FiO2  (%):  [21 %-28 %] 21 %   Intake/Output Summary (Last 24 hours) at 10/20/2020 10/22/2020 Last data filed at 10/20/2020 0600 Gross per 24 hour  Intake 1978.88 ml  Output 1625 ml  Net 353.88 ml   Filed Weights   10/16/20 0400 10/19/20 0500 10/20/20 0400  Weight: 100.1 kg 97.4 kg 96.1 kg    Physical Exam:  General - sleepy Eyes - pupils reactive ENT - trach site clean Cardiac - regular rate/rhythm, no murmur Chest - equal breath sounds b/l, no wheezing or rales Abdomen - soft, non tender, + bowel sounds Extremities - Rt > Lt lower leg edema Skin - no rashes Neuro - follows commands when awake  Labs/imaging that I havepersonally reviewed  (right click and "Reselect all SmartList Selections" daily)   CMP Latest Ref Rng & Units 10/20/2020 10/19/2020 10/19/2020  Glucose 70 - 99 mg/dL 10/21/2020) 672(C) 947(S)  BUN 8 - 23 mg/dL 962(E) 36(O) 29(U)  Creatinine 0.61 - 1.24 mg/dL 76(L 4.65 0.35)  Sodium 135 - 145 mmol/L 147(H) 145 143  Potassium 3.5 - 5.1 mmol/L 3.8 4.1 3.6  Chloride 98 - 111 mmol/L 109 107 105  CO2 22 - 32 mmol/L 28 29 28   Calcium 8.9 - 10.3 mg/dL 4.65(K) ) 8.1(E)  Total Protein 6.5 - 8.1 g/dL - - -  Total Bilirubin 0.3 - 1.2 mg/dL - - -  Alkaline Phos 38 - 126 U/L - - -  AST 15 - 41 U/L - - -  ALT 0 - 44 U/L - - -    CBC Latest Ref Rng & Units 10/20/2020 10/19/2020  10/18/2020  WBC 4.0 - 10.5 K/uL 10.7(H) 13.0(H) 11.1(H)  Hemoglobin 13.0 - 17.0 g/dL 8.1(P) 9.4(L) 10.2(L)  Hematocrit 39.0 - 52.0 % 32.4(L) 31.0(L) 32.4(L)  Platelets 150 - 400 K/uL 156 140(L) 128(L)    CBG (last 3)  Recent Labs    10/19/20 2324 10/20/20 0328 10/20/20 0846  GLUCAP 116* 135* 133*    Resolved Hospital Problem list   Acute obstructive hydrocephalus s/p EVD, Hypernatremia, Moraxella catarrhalis pneumonia s/p antibiotics, Thrombocytopenia, Acute midbrain/dorsal pons/thamalus intraparenchymal hemorrhage  Assessment & Plan:   Acute hypoxic respiratory failure with compromised  airway. Failure to wean s/p tracheostomy 4/06. - off vent since AM of 4/20 - goal SpO2 > 92% - f/u CXR as needed - trach care - continue PM valve as tolerated - f/u with speech therapy  Fever. - Moraxella in sputum from 3/30, Acinetobacter in blood from 4/08 - day 5 of unasyn; plan to d/c after dose on 4/22 per ID recommendation from 4/21  Acute PE and bilateral lower leg DVT. - doppler legs 4/10 >> acute DVT Rt femoral, proximal profunda, popliteal, tibial, peroneal, and gastrocnemius veins; age indeterminate DVT Lt popliteal, posterior tibial, and peroneal veins - s/p IVC filter 4/10 - CT chest 4/19 >> bilateral PE Rt > Lt - remains on heparin gtt; okay to transition to oral anticoagulation  Paroxysmal atrial fibrillation. - continue cardizem - transition to oral anticoagulation  Urinary retention. - continue urecholine  Hyperglycemia. - SSI with lantus  Dysphagia. - D1 diet - f/u with speech therapy  Macrocytic anemia. - f/u CBC intermittently  D/w Dr. Roda Shutters; PCCM will f/u on Monday 10/23/20 >> call if help needed sooner.  Best Practice:  Diet: D1 diet DVT prophylaxis: heparin gtt SUP: not indicated Mobility: PT/OT Code status: full code Disposition: okay to transfer to progressive care  Signature:  Coralyn Helling, MD [facility] Pulmonary/Critical Care Pager - [phone] 10/20/2020, 9:33 AM

## 2020-10-20 NOTE — Progress Notes (Signed)
Inpatient Rehab Admissions Coordinator:  Consult received.  Pt's nurses informed AC that pt would probably not be able to answer questions.  AC called pt's wife, Walter [name].  Left message awaiting return call.   Wolfgang Phoenix, MS, CCC-SLP Admissions Coordinator [phone]

## 2020-10-21 LAB — GLUCOSE, CAPILLARY
Glucose-Capillary: 159 mg/dL — ABNORMAL HIGH (ref 70–99)
Glucose-Capillary: 140 mg/dL — ABNORMAL HIGH (ref 70–99)
Glucose-Capillary: 116 mg/dL — ABNORMAL HIGH (ref 70–99)
Glucose-Capillary: 124 mg/dL — ABNORMAL HIGH (ref 70–99)
Glucose-Capillary: 157 mg/dL — ABNORMAL HIGH (ref 70–99)

## 2020-10-21 LAB — BASIC METABOLIC PANEL
Anion gap: 8 (ref 5–15)
BUN: 31 mg/dL — ABNORMAL HIGH (ref 8–23)
CO2: 28 mmol/L (ref 22–32)
Calcium: 8.6 mg/dL — ABNORMAL LOW (ref 8.9–10.3)
Chloride: 110 mmol/L (ref 98–111)
Creatinine, Ser: 0.91 mg/dL (ref 0.61–1.24)
GFR, Estimated: >60 mL/min (ref >60)
Glucose, Bld: 131 mg/dL — ABNORMAL HIGH (ref 70–99)
Potassium: 3.7 mmol/L (ref 3.5–5.1)
Sodium: 146 mmol/L — ABNORMAL HIGH (ref 135–145)

## 2020-10-21 LAB — CBC
HCT: 30.8 % — ABNORMAL LOW (ref 39.0–52.0)
Hemoglobin: 9.2 g/dL — ABNORMAL LOW (ref 13.0–17.0)
MCH: 30.8 pg (ref 26.0–34.0)
MCHC: 29.9 g/dL — ABNORMAL LOW (ref 30.0–36.0)
MCV: 103 fL — ABNORMAL HIGH (ref 80.0–100.0)
Platelets: 176 10*3/uL (ref 150–400)
RBC: 2.99 MIL/uL — ABNORMAL LOW (ref 4.22–5.81)
RDW: 15 % (ref 11.5–15.5)
WBC: 8.9 10*3/uL (ref 4.0–10.5)
nRBC: 0 % (ref 0.0–0.2)

## 2020-10-21 MED ORDER — ENSURE ENLIVE PO LIQD
237.0000 mL | Freq: Two times a day (BID) | ORAL | Status: DC
  Administered 2020-10-21: 120 mL via ORAL
  Administered 2020-10-21 – 2020-10-28 (×12): 237 mL via ORAL

## 2020-10-21 MED ORDER — FREE WATER
200.0000 mL | Status: DC
  Administered 2020-10-21 – 2020-10-28 (×40): 200 mL

## 2020-10-21 MED ORDER — VITAL 1.5 CAL PO LIQD
960.0000 mL | ORAL | Status: DC
  Administered 2020-10-21 – 2020-10-27 (×7): 960 mL
  Filled 2020-10-21 (×8): qty 1000

## 2020-10-21 MED ORDER — DOXAZOSIN MESYLATE 2 MG PO TABS
2.0000 mg | ORAL_TABLET | Freq: Every day | ORAL | Status: DC
  Administered 2020-10-21 – 2020-10-28 (×8): 2 mg
  Filled 2020-10-21 (×8): qty 1

## 2020-10-21 MED ORDER — VITAL 1.5 CAL PO LIQD: 780.0000 mL | ORAL | Status: DC

## 2020-10-21 MED ORDER — PROSOURCE TF PO LIQD
45.0000 mL | Freq: Three times a day (TID) | ORAL | Status: DC
  Administered 2020-10-21 – 2020-10-28 (×21): 45 mL
  Filled 2020-10-21 (×21): qty 45

## 2020-10-21 MED ORDER — LORAZEPAM 2 MG/ML IJ SOLN: 1.0000 mg | INTRAMUSCULAR | Status: DC | PRN

## 2020-10-21 NOTE — Progress Notes (Signed)
New Admission Note:  Arrival Method: via bed from 4N ICU Mental Orientation: alert to self and place Telemetry: yes Assessment: Completed Skin: pt has bruising to bilateral arms and legs. Cracking/peeling skin on his sacral area. Old blisters that have opened on the back of both thighs that are covered in foam dressings. Edema in all 4 extremities. IV: Left wrist, Rt FA Pain: No signs or symptoms of pain or discomfort Safety Measures: Safety Fall Prevention Plan was given, discussed. Admission: Completed 3W: Patient has been orientated to the room, unit and the staff. Family: pt wife at the bedside that is pleasant and supportive of the pts care.  Orders have been reviewed and implemented. Will continue to monitor the patient. Call light has been placed within reach and bed alarm has been activated.   Kathrynn Humble, RN

## 2020-10-21 NOTE — PMR Pre-admission (Signed)
PMR Admission Coordinator Pre-Admission Assessment  Patient: Walter [name] is an 65 y.o., male MRN: [id number] DOB: [birth year] Height: '6\' 2"'  (188 cm) Weight: 100 kg              Insurance Information HMO:     PPO:      PCP:      IPA:      80/20: yes     OTHER:  PRIMARY: Medicare A & B      Policy#: [id number]      Subscriber: patient CM Name:       Phone#:      Fax#:  Pre-Cert#:       Employer:  Benefits:  Phone #: verified eligibility online via [place] on 10/22/20      Name:  Eff. Date: Part A and B effective 08/29/20     Deduct: $1,556    Out of Pocket Max: NA Life Max: NA  CIR: 100%     SNF: 20 full days Outpatient: 80%     Co-Pay: 20% Home Health: 100%      Co-Pay:  DME: 80%     Co-Pay: 20% Providers: pt's choice  SECONDARY: BCBS Supplement      Policy#: [id number]  Phone#: [phone]  Financial Counselor:       Phone#:   The "Data Collection Information Summary" for patients in Inpatient Rehabilitation Facilities with attached "Privacy Act [place] Records" was provided and verbally reviewed with: Patient and Family  Emergency Contact Information Contact Information    Name Relation Home Work Mobile   [place] Spouse   [phone]     Current Medical History  Patient Admitting Diagnosis: Acute hemorrhagic CVA History of Present Illness: Walter [name] is a 65 year old right-handed male history of atrial fibrillation with ablation not on anticoagulation followed by Dr. Rayann Heman.   Presented 09/17/2020 with right side weakness headache and dysarthria as well as vomiting.  Cranial CT/MRI showed acute intraparenchymal hemorrhage in dorsal left pons with subarachnoid extension into the cerebral aqueduct and fourth ventricle.  CT angiogram head and neck no vascular malformation, anomaly or aneurysm.  Echocardiogram with ejection fraction of 55 to 88% grade 1 diastolic dysfunction no regional wall motion abnormalities.  Admission chemistries unremarkable aside  glucose 169 alcohol negative urine drug screen negative.  Patient did require emergent intubation for airway protection.  Hospital course complicated by hydrocephalus with diagnostic cerebral angiogram completed showing no evidence of aneurysm.  EVD was placed per neurosurgery.  Maintained on 3% hypertonic saline.  Patient was extubated 09/28/2020.  Follow-up cardiology services for atrial fibrillation maintained on Cardizem.  Patient was cleared to begin Lovenox for DVT prophylaxis 09/28/2020.  Repeat head CT 09/29/2020 showed new intraventricular hemorrhage in the third and lateral ventricles.  Fourth ventricular and midbrain hemorrhage unchanged and again repeated 10/24/2020 showing improving hemorrhage and edema along the shunt tract in the right frontal lobe.  No new area of hemorrhage..  Patient with long-term ventilatory support undergone tracheostomy 10/04/2020 per Dr.Chand critical care services.  On 10/08/2020 patient found to have bilateral lower extremity DVTs and underwent placement of IVC filter placement per interventional radiology.  Dopplers again completed 10/25/2020 showing acute DVT right common femoral vein and SF junction right femoral vein and right proximal profunda vein right popliteal vein and right posterior tibial vein right peroneal vein right soleal vein and right gastrocnemius vein it was established the patient had an IVC filter maintained and Eliquis was also initiated.  Patient with fever of unknown origin  infectious disease consulted broad-spectrum antibiotics  initiated.  SARS coronavirus negative.  CT chest abdomen pelvis did show bilateral large volume right greater than left pulmonary emboli no evidence of right heart strain and it was discussed to continue IVC filter as well as Eliquis.  Interventional radiology consulted to discuss possible removal of IVC filter with CT venogram 10/27/2020 showed thrombus involving the IVC and iliac veins.  Large amount of thrombus involving the  infrarenal IVC and IVC filter.  Suprarenal IVC was patent.  Thrombus in the right iliac veins and thrombus in the proximal femoral veins bilaterally and plan was to undergo thrombectomy and retrieval of IVC filter 10/27/2020 per Dr. Vernard Gambles of interventional radiology.  Patient initially n.p.o. with alternative means of nutritional support advanced to dysphagia #3 thin liquids and also discussion of possible need for PEG tube.  Therapy evaluations were completed due to patient decreased functional mobility was recommended for a comprehensive rehab program.   Complete NIHSS TOTAL: 17 Glasgow Coma Scale Score: 14  Past Medical History  Past Medical History:  Diagnosis Date  . Atrial flutter ([place])   . Dizzy 09/25/13  . Near syncope 09/25/13    Family History  family history includes Healthy in his brother and sister; Heart disease in his father and mother.  Prior Rehab/Hospitalizations:  Has the patient had prior rehab or hospitalizations prior to admission? No  Has the patient had major surgery during 100 days prior to admission? Yes  Current Medications   Current Facility-Administered Medications:  .  0.9 %  sodium chloride infusion, , Intravenous, PRN, Rosalin Hawking, MD, Stopped at 10/19/20 1506 .  acetaminophen (TYLENOL) tablet 650 mg, 650 mg, Per Tube, Q4H PRN, 650 mg at 10/26/20 1204 **OR** [DISCONTINUED] acetaminophen (TYLENOL) 160 MG/5ML solution 650 mg, 650 mg, Per Tube, Q4H PRN, 650 mg at 10/19/20 0411 **OR** acetaminophen (TYLENOL) suppository 650 mg, 650 mg, Rectal, Q4H PRN, Rosalin Hawking, MD .  amantadine (SYMMETREL) 50 MG/5ML solution 200 mg, 200 mg, Per Tube, BID, Rosalin Hawking, MD, 200 mg at 10/27/20 1005 .  apixaban (ELIQUIS) tablet 5 mg, 5 mg, Per Tube, BID, Mikhail, Maryann, DO, 5 mg at 10/27/20 1005 .  chlorhexidine gluconate (MEDLINE KIT) (PERIDEX) 0.12 % solution 15 mL, 15 mL, Mouth Rinse, BID, Greta Doom, MD, 15 mL at 10/27/20 0900 .  Chlorhexidine Gluconate Cloth  2 % PADS 6 each, 6 each, Topical, Daily, Rosalin Hawking, MD, 6 each at 10/27/20 1006 .  diltiazem (CARDIZEM) 10 mg/ml oral suspension 30 mg, 30 mg, Per Tube, Q6H, Rosalin Hawking, MD, 30 mg at 10/27/20 1157 .  doxazosin (CARDURA) tablet 2 mg, 2 mg, Per Tube, Daily, Adhikari, Amrit, MD, 2 mg at 10/27/20 1005 .  feeding supplement (ENSURE ENLIVE / ENSURE PLUS) liquid 237 mL, 237 mL, Oral, BID BM, Adhikari, Amrit, MD, 237 mL at 10/26/20 1407 .  feeding supplement (PROSource TF) liquid 45 mL, 45 mL, Per Tube, TID, Shelly Coss, MD, 45 mL at 10/26/20 2152 .  feeding supplement (VITAL 1.5 CAL) liquid 960 mL, 960 mL, Per Tube, Q24H, Shelly Coss, MD, Stopped at 10/27/20 0000 .  free water 200 mL, 200 mL, Per Tube, Q4H, Adhikari, Amrit, MD, 200 mL at 10/27/20 0005 .  guaiFENesin-dextromethorphan (ROBITUSSIN DM) 100-10 MG/5ML syrup 5 mL, 5 mL, Per Tube, Q4H PRN, Margaretha Seeds, MD, 5 mL at 10/26/20 2356 .  hydrALAZINE (APRESOLINE) injection 20 mg, 20 mg, Intravenous, Q6H PRN, Briant Cedar, MD, 20 mg at 10/04/20 [phone] .  insulin aspart (novoLOG) injection 0-20 Units, 0-20 Units, Subcutaneous, Q4H, Magdalen Spatz, NP, 3 Units at 10/26/20 2016 .  insulin glargine (LANTUS) injection 6 Units, 6 Units, Subcutaneous, Daily, Jacky Kindle, MD, 6 Units at 10/26/20 [phone] .  MEDLINE mouth rinse, 15 mL, Mouth Rinse, 10 times per day, Greta Doom, MD, 15 mL at 10/27/20 1200 .  metoprolol tartrate (LOPRESSOR) injection 2.5 mg, 2.5 mg, Intravenous, Q6H PRN, Bowser, Grace E, NP, 2.5 mg at 10/20/20 2119 .  mineral oil-hydrophilic petrolatum (AQUAPHOR) ointment, , Topical, BID PRN, Tawanna Solo, Amrit, MD .  ondansetron (ZOFRAN) injection 4 mg, 4 mg, Intravenous, Q6H, Rosalin Hawking, MD, 4 mg at 10/27/20 1157 .  oxyCODONE (Oxy IR/ROXICODONE) immediate release tablet 5 mg, 5 mg, Per Tube, Q6H PRN, Rosalin Hawking, MD .  QUEtiapine (SEROQUEL) tablet 12.5 mg, 12.5 mg, Oral, QHS, Mikhail, Maryann, DO .  sodium chloride  flush (NS) 0.9 % injection 10-40 mL, 10-40 mL, Intracatheter, Q12H, Rosalin Hawking, MD, 10 mL at 10/27/20 1000 .  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  Independent  Indoor Mobility: Did the patient need assistance with walking from room to room (with or without device)? Independent  Stairs: Did the patient need assistance with internal or external stairs (with or without device)? Independent  Functional Cognition: Did the patient need help planning regular tasks such as shopping or remembering to take medications? Independent  Home Assistive Devices / Equipment Home Assistive Devices/Equipment: None Home Equipment: None  Prior Device Use: Indicate devices/aids used by the patient prior to current illness, exacerbation or injury? None of the above  Current Functional Level Cognition  Arousal/Alertness: Awake/alert Overall Cognitive Status: Impaired/Different from baseline Difficult to assess due to: Tracheostomy Current Attention Level: Sustained Orientation Level: Intubated/Tracheostomy - Unable to assess Following Commands: Follows one step commands consistently,Follows one  step commands with increased time Safety/Judgement: Decreased awareness of safety,Decreased awareness of deficits General Comments: Pt following majority of direct, simple verbal commands this session; pt becoming more fatigued/lethargic with activity requiring cues to open eyes. Verbalizing well with PMV on, although verbalizing less towards end of session with fatigue and significant coughing Attention: Focused,Sustained,Selective Focused Attention: Appears intact Sustained Attention: Impaired Sustained Attention Impairment: Verbal basic,Functional basic Selective Attention: Impaired Selective Attention Impairment: Verbal basic,Functional basic Awareness: Impaired Awareness Impairment: Intellectual impairment,Emergent impairment Problem Solving: Impaired Problem Solving Impairment: Functional basic    Extremity Assessment (includes Sensation/Coordination)  Upper Extremity Assessment: RUE deficits/detail,LUE deficits/detail RUE Deficits / Details: improving functional use, grasp 3/5, elbow flexion 3-/5, and shoulder flexion 3-/5, requires increased time to motor plan. Able to bring hand to mouth. R weaker than L RUE Sensation: decreased light touch,decreased  proprioception RUE Coordination: decreased fine motor,decreased gross motor LUE Deficits / Details: improving functional use, grasp 3/5, elbow flexion 3-/5, and shoulder flexion 3-/5, requires increased time to motor plan. Able to bring hand over head. LUE Sensation: decreased light touch,decreased proprioception LUE Coordination: decreased fine motor,decreased gross motor  Lower Extremity Assessment: Defer to PT evaluation RLE Deficits / Details: grossly 4-/5, pt able to complete SLR, LAQ, DF against mod resistance. denies difference in sensation RLE Coordination: decreased fine motor,decreased gross motor    ADLs  Overall ADL's : Needs assistance/impaired Eating/Feeding: Maximal assistance Eating/Feeding Details (indicate cue  type and reason): assisted with self feeding using R UE and max assist (pureed pineapple), will benefit from built up handles; HOH support x 4 bites with increased effort required Grooming: Wash/dry face,Minimal assistance,Sitting Grooming Details (indicate cue type and reason): to wipe mouth Upper Body Bathing: Moderate assistance,Sitting Lower Body Bathing: Moderate assistance,Bed level,Sit to/from stand Upper Body Dressing : Moderate assistance,Sitting Upper Body Dressing Details (indicate cue type and reason): donning new gown Lower Body Dressing: Moderate assistance Lower Body Dressing Details (indicate cue type and reason): use of figure four for pt to doff socks Toilet Transfer: +2 for physical assistance,+2 for safety/equipment,Maximal assistance,Moderate assistance Toilet Transfer Details (indicate cue type and reason): Max A +2 for pivot to recliner Toileting- Clothing Manipulation and Hygiene: Total assistance,+2 for physical assistance,+2 for safety/equipment,Bed level Toileting - Clothing Manipulation Details (indicate cue type and reason): +2 for maintaining standing and then Total A for peri care after bowel incontenience. Functional mobility during ADLs: Moderate assistance,+2 for physical assistance (sit<>stand with stedy) General ADL Comments: Demonstrating increased activity tolerance    Mobility  Overal bed mobility: Needs Assistance Bed Mobility: Supine to Sit Rolling: Mod assist,+2 for safety/equipment Sidelying to sit: Total assist,+2 for physical assistance Supine to sit: Mod assist,HOB elevated Sit to supine: Max assist,+2 for physical assistance Sit to sidelying: Max assist,+2 for physical assistance General bed mobility comments: Cues for holding therapists hand. stabilizing pt's knees and then pt pulling into upright position.    Transfers  Overall transfer level: Needs assistance Equipment used: 2 person hand held assist Transfers: Sit to/from [place] to Stand: Mod assist,+2 physical assistance Stand pivot transfers: Max assist,+2 safety/equipment,+2 physical assistance Squat pivot transfers: Total assist,+2 physical assistance  Lateral/Scoot Transfers: Total assist,+2 physical assistance General transfer comment: Mod A +2 for power up into standing. Pt demonstrating good initation to stand. Max A +2 for maintaining balance and pivoting hips    Ambulation / Gait / Stairs / Wheelchair Mobility  Ambulation/Gait Ambulation/Gait assistance: Max assist,+2 physical assistance,+2 safety/equipment Gait Distance (Feet): 3 Feet Assistive device: 2 person hand held assist Gait Pattern/deviations: Step-to pattern,Decreased stride length,Decreased step length - right,Decreased stance time - right,Decreased weight shift to right,Leaning posteriorly General Gait Details: Noted taking minimal pivotal steps during stand pivot transfer with maxA+2 and bilateral HHA Gait velocity: decreased Gait velocity interpretation: <1.31 ft/sec, indicative of household ambulator    Posture / Balance Dynamic Sitting Balance Sitting balance - Comments: Able to maintain brief bouts of static sitting without external assist, noted improvement in anterior weight translation, requiring mod-maxA with fatigued Balance Overall balance assessment: Needs assistance Sitting-balance support: Feet supported,Bilateral upper extremity supported Sitting balance-Leahy Scale: Fair Sitting balance - Comments: Able to maintain brief bouts of static sitting without external assist, noted improvement in anterior weight translation, requiring mod-maxA with fatigued Postural control: Posterior lean Standing balance support: Bilateral upper extremity supported,During functional activity Standing balance-Leahy  Scale: Poor Standing balance comment: Reliant on UE support and external assist to maintain static standing    Special needs/care consideration Trach size #6 [place]iley  uncuffed, 28% FiO2,  Skin MASD: rectum/bilateral; Pressure injury: head/left posterior; Surgical incision: right neck; Ecchymosis: arm, leg/bilateral;, Diabetic management novoLOG: 0-20 units every 4 hours; Lantus: 6 units daily,  Bladder and Bowel incontinence  Cortrak    Previous Home Environment (from acute therapy documentation) Living Arrangements: Spouse/significant other  Lives With: Spouse Available Help at Discharge: Family,Available 24 hours/day Type of Home: House Home Layout: Two level,Able to live on main level with bedroom/bathroom Home Access: Stairs to enter Entrance Stairs-Rails: Surveyor, mining of Steps: 5 Bathroom Shower/Tub: Multimedia programmer: Standard Bathroom Accessibility: Yes How Accessible: Accessible via walker [place]: No Additional Comments: dog named ruby  Discharge Living Setting Plans for Discharge Living Setting: Patient's home Type of Home at Discharge: House Discharge Home Layout: Two level,Able to live on main level with bedroom/bathroom Discharge Home Access: Stairs to enter Entrance Stairs-Rails: Right,Left Entrance Stairs-Number of Steps: 5 Discharge Bathroom Shower/Tub: Walk-in shower Discharge Bathroom Toilet: Standard Discharge Bathroom Accessibility: Yes How Accessible: Accessible via walker Does the patient have any problems obtaining your medications?: No  Social/Family/Support Systems Anticipated Caregiver: Toddy Boyd, wife Anticipated Caregiver's Contact Information: [phone] Caregiver Availability: 24/7 Discharge Plan Discussed with Primary Caregiver: Yes Is Caregiver In Agreement with Plan?: Yes Does Caregiver/Family have Issues with Lodging/Transportation while Pt is in Rehab?: No   Goals Patient/Family Goal for Rehab: Min A:PT/OT, Mod I-Supervision: ST Expected length of stay: 18-22 days Pt/Family Agrees to Admission and willing to participate: Yes Program Orientation  Provided & Reviewed with Pt/Caregiver Including Roles  & Responsibilities: Yes   Decrease burden of Care through IP rehab admission: NA   Possible need for SNF placement upon discharge: Not anticipated   Patient Condition: This patient's medical and functional status has changed since the consult dated: 09/29/2020 in which the Rehabilitation Physician determined and documented that the patient's condition is appropriate for intensive rehabilitative care in an inpatient rehabilitation facility. See "History of Present Illness" (above) for medical update. Functional changes are: max to total +2 with all mobility and ADLs. Patient's medical and functional status update has been discussed with the Rehabilitation physician and patient remains appropriate for inpatient rehabilitation. Will admit to inpatient rehab Saturday, 10/28/20.  Preadmission Screen Completed By:  Michel Santee, PT, DPT 10/27/2020 12:00 PM ______________________________________________________________________   Discussed status with Dr. Ranell Patrick on 10/27/20 at 12:03 PM and received approval for admission Saturday, 10/28/20.   Admission Coordinator:  Michel Santee, PT, DPT, time 12:03 PM Sudie Grumbling 10/27/20

## 2020-10-21 NOTE — Progress Notes (Signed)
Inpatient Rehab Admissions:  Inpatient Rehab Consult received.  I met with patient and wife at the bedside for rehabilitation assessment and to discuss goals and expectations of an inpatient rehab admission.  Wife acknowledged understanding of goals and expectations. She is interested in pt pursuing CIR.  Will continue to follow.  Signed:  Graves Madden, MS, CCC-SLP Admissions Coordinator [phone]   

## 2020-10-21 NOTE — Progress Notes (Signed)
Nutrition Follow-up  RD working remotely.  DOCUMENTATION CODES:   Not applicable  INTERVENTION:   - 48-hour calorie count to start today at breakfast meal, RD will follow up with results on Monday, 4/25  Nocturnal tube feeds via Cortrak: - Vital 1.5 @ 80 ml/hr to run over 12 hours from 1800 to 0600 (total of 960 ml) - ProSource TF 45 ml TID - Free water flushes per MD, currently 200 ml q 4 hours  Nocturnal tube feeding regimen provides 1560 kcal, 98 grams of protein, and 733 ml of H2O (meets 65% of kcal needs and 85% of protein needs).  Total free water with flushes: 1933 ml  - Ensure Enlive po BID, each supplement provides 350 kcal and 20 grams of protein  - Magic Cup TID with meals, each supplement provides 290 kcal and 9 grams of protein  - Encourage PO intake  NUTRITION DIAGNOSIS:   Inadequate oral intake related to inability to eat as evidenced by NPO status.  Progressing, pt now on dysphagia 1 diet with thin liquids  GOAL:   Patient will meet greater than or equal to 90% of their needs  Progressing, being addressed via TF, supplements, and diet advancement  MONITOR:   PO intake,Supplement acceptance,TF tolerance,Weight trends  REASON FOR ASSESSMENT:   Consult,Ventilator Enteral/tube feeding initiation and management  ASSESSMENT:   Pt with PMH of afib not on anticoagulation s/p ablation 9/21, s/p cardioversion 2015, retinal detachment s/p surgery, COVID s/p monoclonal antibody treatment 7/21, and CV admitted with right-sided weakness and ICH.  3/29 - EVD placement  3/30 - extubated; cortrak placed (tip gastric) 4/04 - re-intubated 4/06 - trach placed 4/08 - EVD removed 4/21 - pt removed Cortrak 4/22 - MBS, diet advanced to dysphagia 1 with thin liquids, Cortrak replaced (tip gastric)  Consult received for calorie count and to transition pt to nocturnal tube feeds after diet advancement on 4/22. No meal completions charted since diet was advanced.  Discussed plan for calorie with with RN via secure chat. RN to place calorie count envelope on pt's door and will start calorie count today with breakfast meal.  Pt has been tolerating trach collar for >48 hours.  Per RN note on 4/22, pt aspiration on administration of oral medication. Per MD note, there is not need for PEG tube at this time.  Pt currently with free water flushes of 200 ml q 4 hours ordered.  Admit weight: 107.7 kg Current weight: 98.7 kg  Medications reviewed and include: nutrisource fiber BID, SSI q 4 hours, lantus 6 units daily, IV zofran 4 mg q 6 hours  Labs reviewed: sodium 146, BUN 31, hemoglobin 9.2 CBG's: 101-159 x 24 hours  UOP: 1200 ml x 24 hours I/O's: +8.3 L since admit  Diet Order:   Diet Order            DIET - DYS 1 Room service appropriate? Yes; Fluid consistency: Thin  Diet effective now                 EDUCATION NEEDS:   No education needs have been identified at this time  Skin:  Skin Assessment: Skin Integrity Issues: Incisions: neck Other: MASD to rectum  Last BM:  10/20/20 type 6 via rectal tube  Height:   Ht Readings from Last 1 Encounters:  09/25/20 6\' 2"  (1.88 m)    Weight:   Wt Readings from Last 1 Encounters:  10/21/20 98.7 kg    Ideal Body Weight:  86.3 kg  BMI:  Body mass index is 27.94 kg/m.  Estimated Nutritional Needs:   Kcal:  2400-2600  Protein:  115-130 grams  Fluid:  >2 L/day    Mertie Clause, MS, RD, LDN Inpatient Clinical Dietitian Please see [facility] for contact information.

## 2020-10-21 NOTE — Progress Notes (Signed)
PROGRESS NOTE    Walter [name]  ANV:[id number] DOB: 01-Jun-1956 DOA: 09/25/2020 PCP: Alroy Dust, L.Marlou Sa, MD   Chief Complain: Weakness, slurred speech  Brief Narrative: Patient is a 65 year old male with history of A. fib not on anticoagulation status post ablation and conversion, COVID infection in July 2021 who initially presented with right-sided weakness, slurred speech.  Work-up revealed hemorrhagic stroke.  He was emergently intubated in the ED for airway protection and was admitted under PCCM service.  Hospital course was prolonged and remarkable for persistent hypoxia, pneumonia, encephalopathy, PE/bilateral DVT status post IVC filter placement.  Status post trach .  Currently on trach collar.  Transferred to [facility] service on 10/21/2020.   Assessment & Plan:   Active Problems:   ICH (intracerebral hemorrhage) (HCC)   Intracranial hemorrhage (HCC)   Atrial fibrillation (HCC)   Prediabetes   Leukocytosis   Acute blood loss anemia   Hypernatremia   Acute hypoxic respiratory failure: Was intubated in the ED for airway protection.  Failure to wean so status post tracheostomy on 4/6.  Off vent since 4/20.  Currently on trach collar.  Intermittent chest x-ray.  PCCM following.  Continue Passy-Muir valve as tolerated.  Speech therapy following.  Intracerebral hemorrhage: Presented with right-sided weakness, slurred speech.  Code stroke was called on presentation.  CT head showed acute intraparenchymal hemorrhage in the dorsal left pons with subarachnoid extension.  CT head and neck did not show any vascular malformation, and evaluate aneurysm, no LVO. MRI done on 4/8 showed 2-3 punctate infarcts in the left frontal and parietal lobes corresponding to small acute/subacute infarcts.  Echocardiogram showed ejection fraction of 55 to 60%, no wall motion abnormality.  LDL of 88.  Hemoglobin A1c 5.7.  Not on statin due to elevated LFT, we might consider adding statin on discharge. PT/OT recommended  CIR on discharge.  Obstructive hydrocephalus: Intraventricular drain placed on 3/30.  EVD stopped working on 4/2, status post alteplase placement resulting in new hemorrhage along the EVD tract.  Repeat imaging showed improvement  Hospital-acquired pneumonia/ventilator associated pneumonia: Sputum culture on 3/30 showed Moraxella.  Blood culture also showed a sternal bacterial on 4/8.  ID was following.  Treated with Unasyn, completed antibiotics course.  He had intermittent fever while being on antibiotics, could be from DVT/PE. Afebrile today. Leukocytosis improved  Acute PE/bilateral lower leg DVT: Venous Doppler done on 4/10 showed acute DVT on right lower extremity, age-indeterminate DVT on left.  Status post IVC filter on 4/10.  CT angio on 4/20 showed bilateral PE.  He was on heparin drip,  changed to Eliquis.  Severe encephalopathy: Associated with intracerebral hemorrhage.  Had intermittent left upper extremity jerking.  Focal seizures could not be ruled out as per LTM EEG, it showed diffuse encephalopathy.  Angioedema: Much improved.  Concerning for Allergy.  Hypertension: Currently blood pressure stable.  Continue current regimen  Paroxysmal A. fib: Status post ablation/cardioversion in the past.  Was not taking anticoagulation when he was admitted.  Continue current anticoagulation, Eliquis.  On Cardizem for rate control  Urinary retention: On Urecholine.  Foley was placed.  Hyperglycemia: Continue current insulin regimen.  Monitor blood sugars.  Hemoglobin A1c of 5.7  Dysphagia: Speech therapy closely following.  Status post feeding tube placement.  Also on dysphagia 1 diet.  Macrocytic anemia: Currently hemoglobin stable.  Continue monitoring  Pressure ulcers: Not present on admission.  Wound care following.           Nutrition Problem: Inadequate oral intake Etiology:  inability to eat      DVT prophylaxis:Eliquis Code Status: Full Family Communication: None at  bedside Status is: Inpatient  Remains inpatient appropriate because:Inpatient level of care appropriate due to severity of illness   Dispo: The patient is from: Home              Anticipated d/c is to: CIR              Patient currently is not medically stable to d/c.   Difficult to place patient No    Consultants: PCCM, neurology  Procedures: As above  Antimicrobials:  Anti-infectives (From admission, onward)   Start     Dose/Rate Route Frequency Ordered Stop   10/18/20 1100  Ampicillin-Sulbactam (UNASYN) 3 g in sodium chloride 0.9 % 100 mL IVPB        3 g 200 mL/hr over 30 Minutes Intravenous Every 6 hours 10/18/20 0931 10/21/20 0019   10/16/20 1100  Ampicillin-Sulbactam (UNASYN) 3 g in sodium chloride 0.9 % 100 mL IVPB  Status:  Discontinued        3 g 200 mL/hr over 30 Minutes Intravenous Every 6 hours 10/16/20 0948 10/18/20 0859   10/07/20 0000  vancomycin (VANCOREADY) IVPB 1500 mg/300 mL  Status:  Discontinued        1,500 mg 150 mL/hr over 120 Minutes Intravenous Every 12 hours 10/06/20 1000 10/07/20 1406   10/06/20 1200  ceFEPIme (MAXIPIME) 2 g in sodium chloride 0.9 % 100 mL IVPB        2 g 200 mL/hr over 30 Minutes Intravenous Every 8 hours 10/06/20 1000 10/13/20 2225   10/06/20 1100  vancomycin (VANCOREADY) IVPB 2000 mg/400 mL        2,000 mg 200 mL/hr over 120 Minutes Intravenous  Once 10/06/20 1000 10/06/20 1314   10/02/20 0900  Ampicillin-Sulbactam (UNASYN) 3 g in sodium chloride 0.9 % 100 mL IVPB  Status:  Discontinued        3 g 200 mL/hr over 30 Minutes Intravenous Every 6 hours 10/02/20 0812 10/06/20 0954   09/29/20 1130  cefTRIAXone (ROCEPHIN) 2 g in sodium chloride 0.9 % 100 mL IVPB  Status:  Discontinued        2 g 200 mL/hr over 30 Minutes Intravenous Every 24 hours 09/29/20 1037 10/02/20 0812      Subjective: Patient seen and examined at the bedside this morning.  Hemodynamically stable during my evaluation.  Bedbound.  Eyes open, mostly  alert and  awake.  Follows commands.  Objective: Vitals:   10/21/20 0345 10/21/20 0400 10/21/20 0500 10/21/20 0600  BP: 135/69 135/70 137/75 136/71  Pulse: 76 77 76 73  Resp: (!) 25 (!) 26 (!) 21 (!) 30  Temp:  99.4 F (37.4 C)    TempSrc:  Oral    SpO2: 94% 95% 96% 94%  Weight:  98.7 kg    Height:        Intake/Output Summary (Last 24 hours) at 10/21/2020 0745 Last data filed at 10/21/2020 0600 Gross per 24 hour  Intake 1555.16 ml  Output 1200 ml  Net 355.16 ml   Filed Weights   10/19/20 0500 10/20/20 0400 10/21/20 0400  Weight: 97.4 kg 96.1 kg 98.7 kg    Examination:  General exam: Appears calm and comfortable ,Not in distress,average built, deconditioned HEENT:PERRL. feeding tube,trach Respiratory system: Bilateral equal air entry, normal vesicular breath sounds, no wheezes or crackles  Cardiovascular system: S1 & S2 heard, RRR. No JVD, murmurs, rubs, gallops or clicks.  Trace bilateral lower extremity pedal edema. Gastrointestinal system: Abdomen is nondistended, soft and nontender. No organomegaly or masses felt. Normal bowel sounds heard. Central nervous system: Alert and awake, global weakness , hemiplegic on the right side, follows commands Extremities:  no clubbing ,no cyanosis, distal peripheral pulses palpable. Skin: pressure ulcers on buttocks,rectal ube    Data Reviewed: I have personally reviewed following labs and imaging studies  CBC: Recent Labs  Lab 10/17/20 0642 10/18/20 0734 10/19/20 0038 10/20/20 0427 10/21/20 0032  WBC 12.2* 11.1* 13.0* 10.7* 8.9  HGB 11.0* 10.2* 9.7* 9.9* 9.2*  HCT 35.1* 32.4* 31.0* 32.4* 30.8*  MCV 100.3* 100.6* 100.0 102.9* 103.0*  PLT 148* 128* 140* 156 701   Basic Metabolic Panel: Recent Labs  Lab 10/16/20 0815 10/17/20 0642 10/18/20 0734 10/19/20 0038 10/19/20 2242 10/20/20 0427 10/21/20 0032  NA 137 140 138 143 145 147* 146*  K 4.2 4.2 3.9 3.6 4.1 3.8 3.7  CL 101 103 101 105 107 109 110  CO2 _0 GLUCOSE 186* 216* 243* 166* 120* 124* 131*  BUN 28* 42* 59* 58* 46* 37* 31*  CREATININE 0.95 1.14 1.38* 1.25* 1.04 0.97 0.91  CALCIUM 8.6* 8.9 8.5* 8.6* 8.5* 8.7* 8.6*  MG 2.5* 2.7* 3.0*  --  2.9*  --   --    GFR: Estimated Creatinine Clearance: 101.6 mL/min (by C-G formula based on SCr of 0.91 mg/dL). Liver Function Tests: Recent Labs  Lab 10/17/20 0642 10/18/20 0734  AST 56* 61*  ALT 126* 136*  ALKPHOS 129* 129*  BILITOT 0.8 1.0  PROT 6.6 6.3*  ALBUMIN 2.8* 2.8*   No results for input(s): LIPASE, AMYLASE in the last 168 hours. No results for input(s): AMMONIA in the last 168 hours. Coagulation Profile: No results for input(s): INR, PROTIME in the last 168 hours. Cardiac Enzymes: Recent Labs  Lab 10/18/20 0734  CKTOTAL 97   BNP (last 3 results) No results for input(s): PROBNP in the last 8760 hours. HbA1C: No results for input(s): HGBA1C in the last 72 hours. CBG: Recent Labs  Lab 10/20/20 1619 10/20/20 1933 10/20/20 2317 10/21/20 0349 10/21/20 0732  GLUCAP 142* 125* 136* 159* 140*   Lipid Profile: No results for input(s): CHOL, HDL, LDLCALC, TRIG, CHOLHDL, LDLDIRECT in the last 72 hours. Thyroid Function Tests: No results for input(s): TSH, T4TOTAL, FREET4, T3FREE, THYROIDAB in the last 72 hours. Anemia Panel: No results for input(s): VITAMINB12, FOLATE, FERRITIN, TIBC, IRON, RETICCTPCT in the last 72 hours. Sepsis Labs: No results for input(s): PROCALCITON, LATICACIDVEN in the last 168 hours.  Recent Results (from the past 240 hour(s))  Culture, Respiratory w Gram Stain     Status: None   Collection Time: 10/16/20 10:56 AM   Specimen: Tracheal Aspirate; Respiratory  Result Value Ref Range Status   Specimen Description TRACHEAL ASPIRATE  Final   Special Requests NONE  Final   Gram Stain   Final    RARE SQUAMOUS EPITHELIAL CELLS PRESENT MODERATE WBC PRESENT, PREDOMINANTLY PMN FEW YEAST FEW GRAM POSITIVE COCCI    Culture   Final    MODERATE Normal  respiratory flora-no Staph aureus or Pseudomonas seen Performed at [facility], [place] [address] [place], [place] [address]    Report Status 10/18/2020 FINAL  Final         Radiology Studies: DG Swallowing Func-Speech Pathology  Result Date: 10/20/2020 Objective Swallowing Evaluation: Type of Study: MBS-Modified Barium Swallow Study  Patient Details Name: Yaziel  TAYT MOYERS MRN: [id number] Date of Birth: 21-May-1956 Today's Date: 10/20/2020 Time: SLP Start Time (ACUTE ONLY): 6962 -SLP Stop Time (ACUTE ONLY): 0820 SLP Time Calculation (min) (ACUTE ONLY): 25 min Past Medical History: Past Medical History: Diagnosis Date . Atrial flutter ([place])  . Dizzy 09/25/13 . Near syncope 09/25/13 Past Surgical History: Past Surgical History: Procedure Laterality Date . A-FLUTTER ABLATION N/A 03/09/2020  Procedure: A-FLUTTER ABLATION;  Surgeon: Thompson Grayer, MD;  Location: [place] [facility];  Service: Cardiovascular;  Laterality: N/A; . CARDIOVERSION N/A 09/27/2013  Procedure: CARDIOVERSION;  Surgeon: Lelon Perla, MD;  Location: [facility] ENDOSCOPY;  Service: Cardiovascular;  Laterality: N/A; . IR ANGIO EXTERNAL CAROTID SEL EXT CAROTID UNI R MOD SED  09/26/2020 . IR ANGIO INTRA EXTRACRAN SEL COM CAROTID INNOMINATE BILAT MOD SED  09/26/2020 . IR ANGIO VERTEBRAL SEL VERTEBRAL UNI R MOD SED  09/26/2020 . IR IVC FILTER PLMT / S&I /IMG GUID/MOD SED  10/08/2020 . IR US GUIDE VASC ACCESS RIGHT  09/26/2020 . RETINAL DETACHMENT SURGERY   . right knee arthroscopy   . TEE WITHOUT CARDIOVERSION N/A 09/27/2013  Procedure: TRANSESOPHAGEAL ECHOCARDIOGRAM (TEE);  Surgeon: Lelon Perla, MD;  Location: [facility] ENDOSCOPY;  Service: Cardiovascular;  Laterality: N/A; . WRIST SURGERY   HPI: 65 yo male presenting 3/28 with R-sided weakness and slurred speech. Imaging revealed acute hemorrhage centered within the left dorsal midbrain extending into the dorsal pons and inferomedial thalamus with intraventricular extension. EVD placement on 3/29. Extubated  3/30. Decrease in EVD drainage 3/31. Repeat CT shows significant clot in R lateral ventricle. EVD clotted 4/2 and intraventricular TpA administered. Reintubated 4/4. Tracheostomy 4/6. PMH Afib, COVID infection (July 2021), and retinal detachment.  Recent CXR showed. Mild right base subsegmental  atelectasis/infiltrate.  Subjective: sleepy but did surprisingly awake enough to participate Assessment / Plan / Recommendation CHL IP CLINICAL IMPRESSIONS 10/20/2020 Clinical Impression Patient was tested with PMSV in place.  He presents with moderate oral and funcitonal pharyngeal swallow ability.  He was sleeping in chair prior to MBS but did wake adequately to accept po intake and held cup with left hand and brought to mouth.  Lingual weakness and discoordination due to hypoglossal nerve deficit results in premature spillage of liquids and delayed oral transiting with puree/cracker bolus.  Pt's pharyngeal swallow triggers at vallecula with boluses and was overall strong. NO aspiration or penetration observed. Pt masticated tablet despite cues to swallow whole - due to his cognition.  Largest aspiration/malnutrition risk is due to oral deficits and his lethargy. Encourage calorie count to assure he is meeting nutritional needs and assess for oral retention after po.  Will follow up for dysphagia, dysarthria and cognitive linguistic treatment. SLP Visit Diagnosis Dysphagia, oral phase (R13.11) Attention and concentration deficit following -- Frontal lobe and executive function deficit following -- Impact on safety and function Moderate aspiration risk;Risk for inadequate nutrition/hydration   CHL IP TREATMENT RECOMMENDATION 10/20/2020 Treatment Recommendations Therapy as outlined in treatment plan below   Prognosis 10/20/2020 Prognosis for Safe Diet Advancement Good Barriers to Reach Goals -- Barriers/Prognosis Comment -- CHL IP DIET RECOMMENDATION 10/20/2020 SLP Diet Recommendations Dysphagia 1 (Puree) solids;Thin liquid  Liquid Administration via Straw;Cup Medication Administration Crushed with puree Compensations Slow rate;Small sips/bites Postural Changes Remain semi-upright after after feeds/meals (Comment);Seated upright at 90 degrees   CHL IP OTHER RECOMMENDATIONS 10/20/2020 Recommended Consults -- Oral Care Recommendations Oral care QID Other Recommendations Have oral suction available;Place PMSV during PO intake;Clarify dietary restrictions   CHL IP FOLLOW UP RECOMMENDATIONS  10/20/2020 Follow up Recommendations Inpatient Rehab   CHL IP FREQUENCY AND DURATION 10/20/2020 Speech Therapy Frequency (ACUTE ONLY) min 2x/week Treatment Duration 2 weeks      CHL IP ORAL PHASE 10/20/2020 Oral Phase Impaired Oral - Pudding Teaspoon -- Oral - Pudding Cup -- Oral - Honey Teaspoon -- Oral - Honey Cup -- Oral - Nectar Teaspoon Weak lingual manipulation;Premature spillage;Delayed oral transit Oral - Nectar Cup -- Oral - Nectar Straw Weak lingual manipulation Oral - Thin Teaspoon Weak lingual manipulation;Premature spillage Oral - Thin Cup Premature spillage;Weak lingual manipulation Oral - Thin Straw Weak lingual manipulation;Premature spillage Oral - Puree Lingual pumping;Premature spillage;Delayed oral transit Oral - Mech Soft Lingual pumping;Delayed oral transit;Impaired mastication;Premature spillage Oral - Regular -- Oral - Multi-Consistency -- Oral - Pill Other (Comment);Lingual pumping;Weak lingual manipulation;Delayed oral transit Oral Phase - Comment pt masticated tablet  CHL IP PHARYNGEAL PHASE 10/20/2020 Pharyngeal Phase WFL Pharyngeal- Pudding Teaspoon -- Pharyngeal -- Pharyngeal- Pudding Cup -- Pharyngeal -- Pharyngeal- Honey Teaspoon -- Pharyngeal -- Pharyngeal- Honey Cup -- Pharyngeal -- Pharyngeal- Nectar Teaspoon -- Pharyngeal -- Pharyngeal- Nectar Cup -- Pharyngeal -- Pharyngeal- Nectar Straw -- Pharyngeal -- Pharyngeal- Thin Teaspoon -- Pharyngeal -- Pharyngeal- Thin Cup -- Pharyngeal -- Pharyngeal- Thin Straw -- Pharyngeal --  Pharyngeal- Puree -- Pharyngeal -- Pharyngeal- Mechanical Soft -- Pharyngeal -- Pharyngeal- Regular -- Pharyngeal -- Pharyngeal- Multi-consistency -- Pharyngeal -- Pharyngeal- Pill -- Pharyngeal -- Pharyngeal Comment --  CHL IP CERVICAL ESOPHAGEAL PHASE 10/20/2020 Cervical Esophageal Phase WFL Pudding Teaspoon -- Pudding Cup -- Honey Teaspoon -- Honey Cup -- Nectar Teaspoon -- Nectar Cup -- Nectar Straw -- Thin Teaspoon -- Thin Cup -- Thin Straw -- Puree -- Mechanical Soft -- Regular -- Multi-consistency -- Pill -- Cervical Esophageal Comment -- Kathleen Lime, MS [facility] SLP Acute Rehab Services Office [phone] Pager [phone] Macario Golds 10/20/2020, 9:02 AM                   Scheduled Meds: . (feeding supplement) PROSource Plus  30 mL Oral BID BM  . amantadine  200 mg Per Tube BID  . apixaban  5 mg Per Tube BID  . bethanechol  30 mg Per Tube TID  . chlorhexidine gluconate (MEDLINE KIT)  15 mL Mouth Rinse BID  . Chlorhexidine Gluconate Cloth  6 each Topical Daily  . diltiazem  30 mg Per Tube Q6H  . fiber  1 packet Oral BID  . free water  100 mL Per Tube Q4H  . insulin aspart  0-20 Units Subcutaneous Q4H  . insulin glargine  6 Units Subcutaneous Daily  . mouth rinse  15 mL Mouth Rinse 10 times per day  . ondansetron (ZOFRAN) IV  4 mg Intravenous Q6H  . sodium chloride flush  10-40 mL Intracatheter Q12H   Continuous Infusions: . sodium chloride Stopped (10/19/20 1506)  . feeding supplement (VITAL 1.5 CAL) 650 mL (10/20/20 1944)     LOS: 26 days    Time spent: 79 mins,More than 50% of that time was spent in counseling and/or coordination of care.      Shelly Coss, MD [facility] P4/23/2022, 7:45 AM

## 2020-10-21 NOTE — Progress Notes (Signed)
  Speech Language Pathology Treatment: Dysphagia  Patient Details Name: Walter [name] MRN: [id number] DOB: 05-04-1956 Today's Date: 10/21/2020 Time: 1130-1200 SLP Time Calculation (min) (ACUTE ONLY): 30 min  Assessment / Plan / Recommendation Clinical Impression  Pt demonstrates excellent tolerance of thin and puree textures. PT reportedly poorly alert this am when RN attempted breakfast, but by 11 he was verbally requesting water. SLP provided hand over hand assist though pt did active right UE for self feeding and also at times switched to left hand to feed himslef more independently. Pt able to take consecutive straw sips of water and ensure without signs of aspiration. Also able to seal lips on spoon and orally transit purees without oral residue.  Pt also tolerating PMSV well, in place on SLP arrival. Wife verbalized understanding of precautions and PMSV removal procedure, though all were also reiterated to her by SLP. Though pt is articulating and has adequate phonation ability, he is not requiting enough breath for adequate volume. Pt able to achieve normal volume with repeating a single word model x5 in functional communication. Will benefit from ongoing auditory and visual feedback for awareness of low volume. Will continue efforts. Recommend CIR  HPI HPI: 65 yo male presenting 3/28 with R-sided weakness and slurred speech. Imaging revealed acute hemorrhage centered within the left dorsal midbrain extending into the dorsal pons and inferomedial thalamus with intraventricular extension. EVD placement on 3/29. Extubated 3/30. Decrease in EVD drainage 3/31. Repeat CT shows significant clot in R lateral ventricle. EVD clotted 4/2 and intraventricular TpA administered. Reintubated 4/4. Tracheostomy 4/6. PMH Afib, COVID infection (July 2021), and retinal detachment.  Recent CXR showed. Mild right base subsegmental  atelectasis/infiltrate.      SLP Plan  Continue with current plan of care        Recommendations  Diet recommendations: Dysphagia 1 (puree);Thin liquid Liquids provided via: Straw Medication Administration: Whole meds with puree Supervision: Staff to assist with self feeding;Full supervision/cueing for compensatory strategies;Trained caregiver to feed patient Compensations: Slow rate;Small sips/bites Postural Changes and/or Swallow Maneuvers: Out of bed for meals;Seated upright 90 degrees      Patient may use Passy-Muir Speech Valve: During all therapies with supervision;Caregiver trained to provide supervision PMSV Supervision: Full MD: Please consider changing trach tube to : Cuffless         General recommendations: Rehab consult Oral Care Recommendations: Oral care QID;Oral care prior to ice chip/H20 Follow up Recommendations: Inpatient Rehab Plan: Continue with current plan of care       GO               [name], [name]  Acute Rehabilitation Services Pager [phone] Office [phone]  Claudine Mouton 10/21/2020, 12:55 PM

## 2020-10-21 NOTE — Progress Notes (Signed)
Walter [name] notified that Walter [name] will be transferring to [phone]. Discussed that I would transfer all belongings to 3W. Belongings noted at bedside: Ipad, 2 chargers, books/magazines, glasses w/case, lotion, chapstick, photos and sign.

## 2020-10-21 NOTE — Progress Notes (Signed)
Patient's oral care completed prior to eating breakfast. With first bite, patient attempted to chew twice, but did not engage entire mouth or try to swallow. Patient nodded yes when I asked if he wanted me to take it out of his mouth. Will try again with lunch.

## 2020-10-21 NOTE — Progress Notes (Signed)
STROKE TEAM PROGRESS NOTE   INTERVAL HISTORY RNs are at bedside.  Wife is at the bedside.  He seems to be more responsive this morning/today.  He is on amantadine.  He does intermittent simple commands.  There are still challenges with p.o. intake.      Vitals:   10/21/20 1200 10/21/20 1300 10/21/20 1400 10/21/20 1500  BP: (!) 147/74 136/74 (!) 145/76 136/66  Pulse: 71 74 75 79  Resp: (!) 23 (!) 29 19 (!) 28  Temp: 99.3 F (37.4 C)     TempSrc: Axillary     SpO2: 98% 97% 98% 94%  Weight:      Height:       CBC:  Recent Labs  Lab 10/20/20 0427 10/21/20 0032  WBC 10.7* 8.9  HGB 9.9* 9.2*  HCT 32.4* 30.8*  MCV 102.9* 103.0*  PLT 156 176   Basic Metabolic Panel:  Recent Labs  Lab 10/18/20 0734 10/19/20 0038 10/19/20 2242 10/20/20 0427 10/21/20 0032  NA 138   < > 145 147* 146*  K 3.9   < > 4.1 3.8 3.7  CL 101   < > 107 109 110  CO2 26   < > 29 28 28   GLUCOSE 243*   < > 120* 124* 131*  BUN 59*   < > 46* 37* 31*  CREATININE 1.38*   < > 1.04 0.97 0.91  CALCIUM 8.5*   < > 8.5* 8.7* 8.6*  MG 3.0*  --  2.9*  --   --    < > = values in this interval not displayed.    IMAGING  CT head 4/6 1. Ventricle size shows mild interval enlargement since yesterday. No significant hydrocephalus. Intraventricular hemorrhage stable 2. Right frontal ventricular drain unchanged. Hemorrhage surrounding the drain unchanged. 3. Subarachnoid hemorrhage unchanged. Hemorrhage in the left tectum unchanged. No new hemorrhage identified.  CT head 4/4 1. Interval decrease in intraventricular hemorrhage with decreased size of the lateral ventricles as compared to prior, right greater than left. Right parietal approach ventriculostomy in stable position with tip near the septum pellucidum. 2. Stable small volume subarachnoid and intraparenchymal hemorrhage along the catheter tract. 3. Stable 1.9 cm hemorrhage at the left dorsal pons. 4. No other new acute intracranial  abnormality. 09/29/2020  CT head 4/3 IMPRESSION: 1. New intraventricular hemorrhage in the third and lateral ventricles. Fourth ventricular and midbrain hemorrhage is unchanged. 2. New right frontal EVD which traverses the right lateral ventricle. Blood clot encompasses the lower catheter and ventriculomegaly is similar to prior. 3. Small cortical infarcts are newly seen along the superior left frontal convexity.  09/26/2020 IR Angio IMPRESSION: 1. No evidence of an AVM, dural arteriovenous fistula, aneurysm or other vascular abnormality to explain patient's brainstem hemorrhage.  09/26/2020 CT head:  IMPRESSION: 1. Unchanged left brainstem hemorrhage extending into the fourth ventricle. 2. Worsening hydrocephalus.  09/25/2020 MRI brain w/wo, MR Venogram IMPRESSION: 1. Acute hemorrhage centered within the left dorsal midbrain extending into the dorsal pons and inferomedial thalamus with intraventricular extension. No abnormal enhancement to suggest underlying lesion. 2. Compression of the cerebral aqueduct with possible mild obstructive hydrocephalus. 3. No evidence of dural sinus thrombosis. Attenuation of the left basal vein in the quadrigeminal cistern without definite occlusion.  09/25/2020 CT Angio head/neck IMPRESSION: 1. No vascular malformation, anomaly or aneurysm. 2. No emergent large vessel occlusion or high-grade stenosis of the intracranial arteries. 3. Unchanged size of dorsal left pontine hemorrhage with intraventricular extension.  09/25/2020 CT head code  stroke IMPRESSION: Acute intraparenchymal hemorrhage in the dorsal left pons with subarachnoid extension into the cerebral aqueduct and fourth Ventricle.  EEG-LTM 4/5-4/6  This study showed periodic discharges with triphasic morphology at 1.5 to 2.5 Hz which is on the ictal-interictal continuum with low to intermediate potential for seizures.  Additionally there is evidence of moderate to severe  diffuse encephalopathy, nonspecific etiology.  No seizures were seen during the study  EEG adult 10/13/2020 This study is suggestive of moderate diffuse encephalopathy, nonspecific etiology. No seizures or definite epileptiform discharges were seen throughout the recording. [name]   CT HEAD WO CONTRAST  Result Date: 10/17/2020 CLINICAL DATA:  Follow-up intracranial hemorrhage EXAM: CT HEAD WITHOUT CONTRAST TECHNIQUE: Contiguous axial images were obtained from the base of the skull through the vertex without intravenous contrast. COMPARISON:  10/11/2020 FINDINGS: Brain: Intraparenchymal hemorrhage in the left side the upper pons and midbrain no longer shows any hyperdense components. There is an intra-axial cystic space measuring up to 15 mm in diameter, which may communicate with the fourth ventricle. No focal cerebellar abnormality is seen. Continuing diminishing density of blood along the ventriculostomy track in the right frontal region. Persistent brain edema surrounding that. Small amount of subarachnoid blood possibly subdural blood the ventriculostomy entry site appears similar. No evidence of any new or increased bleeding. Small cortical infarctions of the left frontoparietal vertex are barely appreciable. No evidence of new brain infarction. Ventricular size remains stable without evidence of ongoing aqueductal obstruction. Previously seen hyperdense blood dependent within the occipital horns is diminishing. Vascular: No vascular finding. Skull: Normal other than the right frontal burr hole. Sinuses/Orbits: Paranasal sinuses are clear. Orbits are normal. No fluid in the middle ears. Small mastoid effusions. Other: None IMPRESSION: 1. Continuing diminishing density of blood along the ventriculostomy track in the right frontal region. No evidence of any new or increased bleeding. Diminishing dependent intraventricular blood in the lateral ventricles. Very small amount of subarachnoid blood  and possibly subdural blood at the ventriculostomy entry site appears similar. 2. Small cortical infarctions of the left frontoparietal vertex are barely appreciable. No evidence of new or increased infarction. 3. Ventricular size remains stable without evidence of ongoing aqueductal obstruction. 4. 15 mm intra-axial cystic space in the left side of the upper pons and midbrain at the site of the previous hematoma. This may communicate with the fourth ventricle. Electronically Signed   By: Paulina Fusi M.D.   On: 10/17/2020 07:35   CT HEAD WO CONTRAST  Result Date: 10/17/2020 CLINICAL DATA:  Follow-up intracranial hemorrhage EXAM: CT HEAD WITHOUT CONTRAST TECHNIQUE: Contiguous axial images were obtained from the base of the skull through the vertex without intravenous contrast. COMPARISON:  10/11/2020 FINDINGS: Brain: Intraparenchymal hemorrhage in the left side the upper pons and midbrain no longer shows any hyperdense components. There is an intra-axial cystic space measuring up to 15 mm in diameter, which may communicate with the fourth ventricle. No focal cerebellar abnormality is seen. Continuing diminishing density of blood along the ventriculostomy track in the right frontal region. Persistent brain edema surrounding that. Small amount of subarachnoid blood possibly subdural blood the ventriculostomy entry site appears similar. No evidence of any new or increased bleeding. Small cortical infarctions of the left frontoparietal vertex are barely appreciable. No evidence of new brain infarction. Ventricular size remains stable without evidence of ongoing aqueductal obstruction. Previously seen hyperdense blood dependent within the occipital horns is diminishing. Vascular: No vascular finding. Skull: Normal other than the right frontal burr  hole. Sinuses/Orbits: Paranasal sinuses are clear. Orbits are normal. No fluid in the middle ears. Small mastoid effusions. Other: None IMPRESSION: 1. Continuing  diminishing density of blood along the ventriculostomy track in the right frontal region. No evidence of any new or increased bleeding. Diminishing dependent intraventricular blood in the lateral ventricles. Very small amount of subarachnoid blood and possibly subdural blood at the ventriculostomy entry site appears similar. 2. Small cortical infarctions of the left frontoparietal vertex are barely appreciable. No evidence of new or increased infarction. 3. Ventricular size remains stable without evidence of ongoing aqueductal obstruction. 4. 15 mm intra-axial cystic space in the left side of the upper pons and midbrain at the site of the previous hematoma. This may communicate with the fourth ventricle. Electronically Signed   By: Paulina FusiMark  Shogry M.D.   On: 10/17/2020 07:35   CT CHEST W CONTRAST  Result Date: 10/17/2020 CLINICAL DATA:  Fever of unknown origin. EXAM: CT CHEST, ABDOMEN, AND PELVIS WITH CONTRAST TECHNIQUE: Multidetector CT imaging of the chest, abdomen and pelvis was performed following the standard protocol during bolus administration of intravenous contrast. CONTRAST:  100mL OMNIPAQUE IOHEXOL 300 MG/ML  SOLN COMPARISON:  Plain film 10/17/2020.  No prior CTs. FINDINGS: CT CHEST FINDINGS Cardiovascular: Aortic atherosclerosis. Normal heart size, without pericardial effusion. Bilateral pulmonary emboli are identified on this non dedicated exam. Example in the central right pulmonary artery on 28/3. Branch pulmonary arteries to both upper and lower lobes on 32/3. Within the left upper lobe pulmonary artery branch on 20/3. Mediastinum/Nodes: No mediastinal or hilar adenopathy. Lungs/Pleura: No pleural fluid. Endotracheal tube terminates appropriately. Areas of mild peripheral predominant airspace and ground-glass are indeterminate. Example at the left apex on 22/5 and right lower lobe on 58/5. More dependent bibasilar airspace opacities are likely due to atelectasis. Musculoskeletal: No acute osseous  abnormality. CT ABDOMEN PELVIS FINDINGS Hepatobiliary: Mild hepatomegaly at 18.4 cm craniocaudal. Normal gallbladder, without biliary ductal dilatation. Pancreas: Normal, without mass or ductal dilatation. Spleen: Normal in size, without focal abnormality. Adrenals/Urinary Tract: Normal adrenal glands. Bilateral renal sinus cysts, without hydronephrosis. Foley catheter within the urinary bladder. Stomach/Bowel: Nasogastric tube terminating at the gastric antrum. Rectal catheter in place. Scattered colonic diverticula. Normal terminal ileum and appendix. Normal small bowel. Vascular/Lymphatic: Aortic atherosclerosis. IVC filter is appropriately positioned. No abdominopelvic adenopathy. Reproductive: Mild prostatomegaly. Other: No significant free fluid. No free intraperitoneal air. Mild pelvic anasarca. tiny bilateral fat containing inguinal hernias. Musculoskeletal: Degenerative partial fusion of bilateral sacroiliac joints in. Degenerate disc disease at the lumbosacral junction. IMPRESSION: 1. Bilateral, large volume right greater than left pulmonary emboli on this nondedicated exam. No evidence of right heart strain. 2. Mild peripheral ground-glass opacities, favoring atelectasis or infarct. Atypical infection felt less likely. 3. Otherwise, no explanation for fever. 4.  Aortic Atherosclerosis (ICD10-I70.0). 5. Prostatomegaly. These results will be called to the ordering clinician or representative by the Radiologist Assistant, and communication documented in the PACS or [employer]. Electronically Signed   By: Jeronimo GreavesKyle  Talbot M.D.   On: 10/17/2020 14:43   CT ABDOMEN PELVIS W CONTRAST  Result Date: 10/17/2020 CLINICAL DATA:  Fever of unknown origin. EXAM: CT CHEST, ABDOMEN, AND PELVIS WITH CONTRAST TECHNIQUE: Multidetector CT imaging of the chest, abdomen and pelvis was performed following the standard protocol during bolus administration of intravenous contrast. CONTRAST:  100mL OMNIPAQUE IOHEXOL 300 MG/ML   SOLN COMPARISON:  Plain film 10/17/2020.  No prior CTs. FINDINGS: CT CHEST FINDINGS Cardiovascular: Aortic atherosclerosis. Normal heart size, without pericardial effusion.  Bilateral pulmonary emboli are identified on this non dedicated exam. Example in the central right pulmonary artery on 28/3. Branch pulmonary arteries to both upper and lower lobes on 32/3. Within the left upper lobe pulmonary artery branch on 20/3. Mediastinum/Nodes: No mediastinal or hilar adenopathy. Lungs/Pleura: No pleural fluid. Endotracheal tube terminates appropriately. Areas of mild peripheral predominant airspace and ground-glass are indeterminate. Example at the left apex on 22/5 and right lower lobe on 58/5. More dependent bibasilar airspace opacities are likely due to atelectasis. Musculoskeletal: No acute osseous abnormality. CT ABDOMEN PELVIS FINDINGS Hepatobiliary: Mild hepatomegaly at 18.4 cm craniocaudal. Normal gallbladder, without biliary ductal dilatation. Pancreas: Normal, without mass or ductal dilatation. Spleen: Normal in size, without focal abnormality. Adrenals/Urinary Tract: Normal adrenal glands. Bilateral renal sinus cysts, without hydronephrosis. Foley catheter within the urinary bladder. Stomach/Bowel: Nasogastric tube terminating at the gastric antrum. Rectal catheter in place. Scattered colonic diverticula. Normal terminal ileum and appendix. Normal small bowel. Vascular/Lymphatic: Aortic atherosclerosis. IVC filter is appropriately positioned. No abdominopelvic adenopathy. Reproductive: Mild prostatomegaly. Other: No significant free fluid. No free intraperitoneal air. Mild pelvic anasarca. tiny bilateral fat containing inguinal hernias. Musculoskeletal: Degenerative partial fusion of bilateral sacroiliac joints in. Degenerate disc disease at the lumbosacral junction. IMPRESSION: 1. Bilateral, large volume right greater than left pulmonary emboli on this nondedicated exam. No evidence of right heart strain. 2.  Mild peripheral ground-glass opacities, favoring atelectasis or infarct. Atypical infection felt less likely. 3. Otherwise, no explanation for fever. 4.  Aortic Atherosclerosis (ICD10-I70.0). 5. Prostatomegaly. These results will be called to the ordering clinician or representative by the Radiologist Assistant, and communication documented in the PACS or [employer]. Electronically Signed   By: Jeronimo Greaves M.D.   On: 10/17/2020 14:43     PHYSICAL EXAM  Temp:  [98.8 F (37.1 C)-99.5 F (37.5 C)] 99.3 F (37.4 C) (04/23 1200) Pulse Rate:  [71-105] 79 (04/23 1500) Resp:  [14-35] 28 (04/23 1500) BP: (119-148)/(61-83) 136/66 (04/23 1500) SpO2:  [89 %-100 %] 94 % (04/23 1500) FiO2 (%):  [28 %] 28 % (04/23 1112) Weight:  [98.7 kg] 98.7 kg (04/23 0400)  General - Well nourished, well developed, on trach collar  Ophthalmologic - fundi not visualized due to noncooperation.  Cardiovascular - Regular rate and rhythm, not in afib.  Neuro - on trach collar now, still has copious secretions, he is alert and attentive today and attempts to follow commands and does follow commands intermittently.  No verbal output.  There is antigravity strength in the upper extremities and 2/5 strength in the legs.  ASSESSMENT/PLAN [name] is a 65 y.o. male with history of atrial flutter newly diagnosed with atrial fibrillation in February 2022 not on anticoagulation, follows with Dr. Johney Frame in cardiology.  He presented with right-sided weakness, slurred speech, headache, and vomiting.Last known well when he went to bed at 10 PM on 09/24/2020 and woke up at around 2 AM on the day of admission with slurred speech.  EMS was called.He was very drowsy upon their assessment and notable right side weakeness.  He had worsening mental status, GCS 5, so was emergently intubated in ED for airway protection.  His MRI brain showed acute hemorrhage at left midbrain extending into pons with IVH associated with  obstructive hydrocephalus.    ICH - Left Dorsalmidbrain ICHextending into dorsal pons and inferior thalamus with mild mass-effect on third ventricle and hydrocephalus,source unknown, suspect occult cavernoma  Code Stroke CT head-Acute IPH in the dorsal left pons with  subarachnoid extension into the cerebral aqueduct and fourth ventricle.  CTA head & neck-No vascular malformation, anomaly or aneurysm. No emergent large vessel occlusion or high-grade stenosis. Unchanged size of dorsal left pontine hemorrhage with intraventricular extension.  MRI& MR Venogram-Acute hemorrhage centered within the left dorsal midbrain extending into the dorsal pons and inferomedial thalamus with intraventricular extension. Compression of the cerebral aqueduct with possible mild obstructive hydrocephalus. No evidence of dural sinus thrombosis.   CT Head W/O Contrast 3/29 0250 -Unchanged left brainstem hemorrhage extending into the fourth ventricle. Worsening hydrocephalus.  Diagnostic Cerebral Angiogram - No evidence of an aneurysm, AVM, dural AV fistula or other vascular abnormality to explain patient's known intraparenchymal hemorrhage.  MRI brain 4/8 -stable IVH, midbrain ICH and right frontal EVD tract hemorrhage.  Ventricular size stable.  2-3 punctate infarcts left frontal and parietal lobes, corresponding to small acute/subacute infarcts.  CT head 4/13 stable IVH, ICH, decreased ventricle size  CT repeat 4/19 continue evolution of ICH and IVH, nearly absorbed now  2D Echo- EF:55-60%. No wall motion abnormalities.  LDL- 88  HgbA1c5.7  VTE prophylaxis - subcu heparin  No antithromboticprior to admission, on heparin IV.  We will switch to Eliquis.  Therapy recommendations:CIR vs LTAC  Disposition:Pending  Obstructive hydrocephalus   EVD placed on 3/30   CT 4/1 showed clot in right lateral ventricle around catheter.   S/p 1mg  (1cc) alteplase placed in EVD 09/29/20 -> drain well  ->ICP measuring 4-11 with a good waveform  CT head (4/3) resolution of most of the IVH and improved hydrocephalus.  EVD stopped working 4/2 overnight. 0.5mg  alteplase placed in EVD 10/01/2020 -> Post alteplase CT head new hemorrhage along the EVD tract an in the right lateral ventricle.  CT repeat 4/4 - decreased IVH and decompressed right lateral ventricle  CT repeat 4/5 -mild interval enlargement of ventricular size, no significant hydrocephalus.  IVH stable.  HCT 4/7 stable, with no appreciable change in overall ventricular size  HCT 4/13 decreased ventricular size  CT repeat 4/19 continue evolution of ICH and IVH, nearly absorbed now  4/16 will remove scalp staples.   Acute Respiratory Failure  Required intubation on 4/4 due to worsening MS and hypoxia  S/p trach 10/04/20  Off sedation  On trach collar with improving secretions -> speaking valve  On robitussin PRN   Bilateral DVT Bilateral, large volume right greater than left PE  LE venous Doppler 4/10 with bilateral DVTs  Status post IVC filter  CT chest showed bilateral PE, which I believe related to her DVT found on 4/10, not after IVC filter  On heparin IV, will switch to Eliquis  [facility] Pneum[facility]-acquired pneumonia with Moraxella Fever   Tmax 100.6->100.4->101.5->101.7->afebrile->100.1  Off arctic sun  Sputum culture (3/30): Moraxella  Sputum culture 4/5: neg  WBC 9.8->8.5->8.7->8.9->10.6->12.2->13.0->10.7  CXR 4/7, 4/8 left basilar opacities  CXR 4/19 unremarkable  Blood culture 4/8 positive for Acinetobacter  Off cefepime and vanco -> unasyn (off 4/23)  PICC removed  ID on board - CT abd/pelvis/chest no infection source found  SIRPIDs, resolved Severe encephalopathy, improving  Left UE intermittent jerking with stimulation  LTM EEG: GPEDs with triphasic wave, generalized SIRPIDs, although focal seizure is in DDx  S/p keppra 10/03/20 -> d/c concerning for angioedema -> on  vimpat 10/04/20-> off vimpat 10/11/20  EEG 4/4 moderate to severe diffuse encephalopathy   Repeat spot EEG 4/15 moderate diffuse encephalopathy  Angioedema, resolved  Upper lip edema, much improved  Concerning for keppra allergy  Hypertension  Home meds:Diltiazem  Currently on Diltiazem 30 mg q6  off clonidine  BP goal Systolic < 160  Long-term BP goal normotensive  Hx of Aflutter PAF  Not on [facility] before admission  S/p CTI ablation for A flutter  off ASA  Not AC candidate at this time.  Afib with RVR intermittent -> sinus now  On heparin IV, will switch to Eliquis  Hyperlipidemia  Home meds:none  LDL88, goal < 70  AST/ALT 56/126->61/136  No statin now due to elevated LFTs  May consider to addstatin on discharge if LFT normalized  Dysphagia  Secondary to hemorrhagic stroke  Passed swallow  On Dys 1 and thin liquid  SLP on board  Tube feeds @ 62ml/hr for nocturnal feeding  Encourage po intake  Calorie count ordered  Other Stroke Risk Factors  Advanced Age >/= 65  Snoring: sleep study denied by insurance prior to admission   Other Active Problems:  Hypokalemia  AKI Cre 0.95->1.14->1.38->1.25, on TF  Lethargic - on amantadine   Wound care consulted for buttock wound due to diarrhea  Hospital day #26  This patient is critically ill due to ICH, IVH, hydrocephalus, sepsis, fever, s/p trach, dysphagia and at significant risk of neurological worsening, death form sepsis, seizure, obstructive hydrocephalus, PE. This patient's care requires constant monitoring of vital signs, hemodynamics, respiratory and cardiac monitoring, review of multiple databases, neurological assessment, discussion with family, other specialists and medical decision making of high complexity. I spent 35 minutes of neurocritical care time in the care of this patient.    10/21/2020 3:17 PM

## 2020-10-21 NOTE — Evaluation (Signed)
Speech Language Pathology Evaluation Patient Details Name: ZAYVIER CARAVELLO MRN: [id number] DOB: 1956/01/08 Today's Date: 10/21/2020 Time: 1130-1200 SLP Time Calculation (min) (ACUTE ONLY): 30 min  Problem List:  Patient Active Problem List   Diagnosis Date Noted  . Intracranial hemorrhage (HCC)   . Atrial fibrillation (HCC)   . Prediabetes   . Leukocytosis   . Acute blood loss anemia   . Hypernatremia   . ICH (intracerebral hemorrhage) (HCC) 09/25/2020  . Unilateral primary osteoarthritis, left knee 09/26/2016  . Atrial flutter (HCC) 09/25/2013  . Near syncope 09/25/2013   Past Medical History:  Past Medical History:  Diagnosis Date  . Atrial flutter (HCC)   . Dizzy 09/25/13  . Near syncope 09/25/13   Past Surgical History:  Past Surgical History:  Procedure Laterality Date  . A-FLUTTER ABLATION N/A 03/09/2020   Procedure: A-FLUTTER ABLATION;  Surgeon: Hillis Range, MD;  Location: [facility] INVASIVE CV [facility];  Service: Cardiovascular;  Laterality: N/A;  . CARDIOVERSION N/A 09/27/2013   Procedure: CARDIOVERSION;  Surgeon: Lewayne Bunting, MD;  Location: [facility] ENDOSCOPY;  Service: Cardiovascular;  Laterality: N/A;  . IR ANGIO EXTERNAL CAROTID SEL EXT CAROTID UNI R MOD SED  09/26/2020  . IR ANGIO INTRA EXTRACRAN SEL COM CAROTID INNOMINATE BILAT MOD SED  09/26/2020  . IR ANGIO VERTEBRAL SEL VERTEBRAL UNI R MOD SED  09/26/2020  . IR IVC FILTER PLMT / S&I /IMG GUID/MOD SED  10/08/2020  . IR US GUIDE VASC ACCESS RIGHT  09/26/2020  . RETINAL DETACHMENT SURGERY    . right knee arthroscopy    . TEE WITHOUT CARDIOVERSION N/A 09/27/2013   Procedure: TRANSESOPHAGEAL ECHOCARDIOGRAM (TEE);  Surgeon: Lewayne Bunting, MD;  Location: [facility] ENDOSCOPY;  Service: Cardiovascular;  Laterality: N/A;  . WRIST SURGERY     HPI:  65 yo male presenting 3/28 with R-sided weakness and slurred speech. Imaging revealed acute hemorrhage centered within the left dorsal midbrain extending into the dorsal pons and inferomedial  thalamus with intraventricular extension. EVD placement on 3/29. Extubated 3/30. Decrease in EVD drainage 3/31. Repeat CT shows significant clot in R lateral ventricle. EVD clotted 4/2 and intraventricular TpA administered. Reintubated 4/4. Tracheostomy 4/6. PMH Afib, COVID infection (July 2021), and retinal detachment.  Recent CXR showed. Mild right base subsegmental  atelectasis/infiltrate.   Assessment / Plan / Recommendation Clinical Impression  Pt demonstrates moderate cognitive lingusitic impairment. Informal assessment completed given arrival of meal tray. Pt assess with functional task and communication efforts. Further diagnotic assesement will be needed in short increments as pts fatigues easily. Pt able to sustain attention to verbal and functional tasks for less than a minute with delayed response time and often multiple repetition needed. Pt seems distracted by visual impairment (holding one eye shut, likely double vision). Favors left attention, but will turn to midline and occasionally cross visually when looking for wife or his own hand. Shows some basic problem solving ability. Particularly needs to address awareness for self correction. He is able to follow one step commands in about 75% of opportunities. Difficult to assess expressive language yet given very poor breath support for intelligiblity with PMSV. Pt able to repeat single words, but with spontaneous speech he is minimally intelligible though language structue is discernible. Will continue efforts, recommend CIR on d/c.    SLP Assessment  SLP Recommendation/Assessment: Patient needs continued Speech Lanaguage Pathology Services SLP Visit Diagnosis: Cognitive communication deficit (R41.841)    Follow Up Recommendations  Inpatient Rehab    Frequency and Duration min  2x/week  2 weeks      SLP Evaluation Cognition  Overall Cognitive Status: Impaired/Different from baseline Arousal/Alertness: Awake/alert Orientation Level:  Oriented to person;Oriented to place;Oriented to situation Attention: Focused;Sustained;Selective Focused Attention: Appears intact Sustained Attention: Impaired Sustained Attention Impairment: Verbal basic;Functional basic Selective Attention: Impaired Selective Attention Impairment: Verbal basic;Functional basic Awareness: Impaired Awareness Impairment: Intellectual impairment;Emergent impairment Problem Solving: Impaired Problem Solving Impairment: Functional basic       Comprehension  Auditory Comprehension Overall Auditory Comprehension: Impaired Yes/No Questions: Impaired Basic Biographical Questions: 51-75% accurate Basic Immediate Environment Questions: 50-74% accurate Complex Questions: 0-24% accurate Commands: Impaired One Step Basic Commands: 50-74% accurate Conversation: Simple Interfering Components: Attention;Visual impairments Reading Comprehension Reading Status: Not tested    Expression Verbal Expression Overall Verbal Expression: Impaired Initiation: Impaired Automatic Speech: Name;Social Response Level of Generative/Spontaneous Verbalization: Word;Phrase Repetition: No impairment Naming: Impairment Confrontation: Impaired Interfering Components: Attention   Oral / Motor  Oral Motor/Sensory Function Overall Oral Motor/Sensory Function: Mild impairment Facial ROM: Reduced right Facial Symmetry: Abnormal symmetry right Facial Strength: Reduced right Lingual Symmetry: Within Functional Limits Lingual Strength: Reduced Motor Speech Overall Motor Speech: Impaired Respiration: Impaired Level of Impairment: Word Phonation: Low vocal intensity Resonance: Within functional limits Articulation: Within functional limitis Intelligibility: Intelligibility reduced Word: 75-100% accurate Phrase: 25-49% accurate Sentence: 0-24% accurate Conversation: 0-24% accurate   GO                   [name], [name]  Acute Rehabilitation Services Pager  [phone] Office [phone]  Claudine Mouton 10/21/2020, 1:10 PM

## 2020-10-22 LAB — GLUCOSE, CAPILLARY
Glucose-Capillary: 102 mg/dL — ABNORMAL HIGH (ref 70–99)
Glucose-Capillary: 106 mg/dL — ABNORMAL HIGH (ref 70–99)
Glucose-Capillary: 136 mg/dL — ABNORMAL HIGH (ref 70–99)
Glucose-Capillary: 139 mg/dL — ABNORMAL HIGH (ref 70–99)
Glucose-Capillary: 142 mg/dL — ABNORMAL HIGH (ref 70–99)
Glucose-Capillary: 152 mg/dL — ABNORMAL HIGH (ref 70–99)
Glucose-Capillary: 94 mg/dL (ref 70–99)

## 2020-10-22 LAB — COMPREHENSIVE METABOLIC PANEL
ALT: 79 U/L — ABNORMAL HIGH (ref 0–44)
AST: 37 U/L (ref 15–41)
Albumin: 2.5 g/dL — ABNORMAL LOW (ref 3.5–5.0)
Alkaline Phosphatase: 116 U/L (ref 38–126)
Anion gap: 8 (ref 5–15)
BUN: 24 mg/dL — ABNORMAL HIGH (ref 8–23)
CO2: 26 mmol/L (ref 22–32)
Calcium: 8.7 mg/dL — ABNORMAL LOW (ref 8.9–10.3)
Chloride: 112 mmol/L — ABNORMAL HIGH (ref 98–111)
Creatinine, Ser: 0.78 mg/dL (ref 0.61–1.24)
GFR, Estimated: >60 mL/min (ref >60)
Glucose, Bld: 136 mg/dL — ABNORMAL HIGH (ref 70–99)
Potassium: 4.1 mmol/L (ref 3.5–5.1)
Sodium: 146 mmol/L — ABNORMAL HIGH (ref 135–145)
Total Bilirubin: 0.8 mg/dL (ref 0.3–1.2)
Total Protein: 5.6 g/dL — ABNORMAL LOW (ref 6.5–8.1)

## 2020-10-22 LAB — CBC WITH DIFFERENTIAL/PLATELET
Abs Immature Granulocytes: 0.04 10*3/uL (ref 0.00–0.07)
Basophils Absolute: 0 10*3/uL (ref 0.0–0.1)
Basophils Relative: 0 %
Eosinophils Absolute: 0.8 10*3/uL — ABNORMAL HIGH (ref 0.0–0.5)
Eosinophils Relative: 9 %
HCT: 28.9 % — ABNORMAL LOW (ref 39.0–52.0)
Hemoglobin: 8.9 g/dL — ABNORMAL LOW (ref 13.0–17.0)
Immature Granulocytes: 1 %
Lymphocytes Relative: 21 %
Lymphs Abs: 1.7 10*3/uL (ref 0.7–4.0)
MCH: 31.7 pg (ref 26.0–34.0)
MCHC: 30.8 g/dL (ref 30.0–36.0)
MCV: 102.8 fL — ABNORMAL HIGH (ref 80.0–100.0)
Monocytes Absolute: 0.4 10*3/uL (ref 0.1–1.0)
Monocytes Relative: 4 %
Neutro Abs: 5.3 10*3/uL (ref 1.7–7.7)
Neutrophils Relative %: 65 %
Platelets: 150 10*3/uL (ref 150–400)
RBC: 2.81 MIL/uL — ABNORMAL LOW (ref 4.22–5.81)
RDW: 15.1 % (ref 11.5–15.5)
WBC: 8.2 10*3/uL (ref 4.0–10.5)
nRBC: 0 % (ref 0.0–0.2)

## 2020-10-22 LAB — FOLATE: Folate: 9.7 ng/mL (ref >5.9)

## 2020-10-22 LAB — VITAMIN B12: Vitamin B-12: 495 pg/mL (ref 180–914)

## 2020-10-22 MED ORDER — FUROSEMIDE 10 MG/ML IJ SOLN
40.0000 mg | Freq: Two times a day (BID) | INTRAMUSCULAR | Status: AC
  Administered 2020-10-22 (×2): 40 mg via INTRAVENOUS
  Filled 2020-10-22 (×2): qty 4

## 2020-10-22 NOTE — Progress Notes (Signed)
PROGRESS NOTE    Walter [name]  FVC:[id number] DOB: [birth year] DOA: 09/25/2020 PCP: Alroy Dust, L.Marlou Sa, MD   Chief Complain: Weakness, slurred speech  Brief Narrative: Patient is a 65 year old male with history of A. fib not on anticoagulation status post ablation and conversion, COVID infection in July 2021 who initially presented with right-sided weakness, slurred speech.  Work-up revealed hemorrhagic stroke.  He was emergently intubated in the ED for airway protection and was admitted under PCCM service.  Hospital course was prolonged and remarkable for persistent hypoxia, pneumonia, encephalopathy, PE/bilateral DVT status post IVC filter placement.  Status post trach .  Currently on trach collar.  Transferred to [facility] service on 10/21/2020.  Eventual plan is to transfer him to CIR.  Medically stable for transfer to [place]:   Active Problems:   ICH (intracerebral hemorrhage) (HCC)   Intracranial hemorrhage (HCC)   Atrial fibrillation (HCC)   Prediabetes   Leukocytosis   Acute blood loss anemia   Hypernatremia   Acute hypoxic respiratory failure: Was intubated in the ED for airway protection.  Failure to wean so status post tracheostomy on 4/6.  Off vent since 4/20.  Currently on trach collar.  Intermittent chest x-ray.  PCCM following.  Continue Passy-Muir valve as tolerated.  Speech therapy following.  Intracerebral hemorrhage: Presented with right-sided weakness, slurred speech.  Code stroke was called on presentation.  CT head showed acute intraparenchymal hemorrhage in the dorsal left pons with subarachnoid extension.  CT head and neck did not show any vascular malformation, and evaluate aneurysm, no LVO. MRI done on 4/8 showed 2-3 punctate infarcts in the left frontal and parietal lobes corresponding to small acute/subacute infarcts.  Echocardiogram showed ejection fraction of 55 to 60%, no wall motion abnormality.  LDL of 88.  Hemoglobin A1c 5.7.  Not on statin due to  elevated LFT, we might consider adding statin on discharge. PT/OT recommended CIR on discharge.  Obstructive hydrocephalus: Intraventricular drain placed on 3/30.  EVD stopped working on 4/2, status post alteplase placement resulting in new hemorrhage along the EVD tract.  Repeat imaging showed improvement  Hospital-acquired pneumonia/ventilator associated pneumonia: Sputum culture on 3/30 showed Moraxella. One of the blood cultures also showed Acenatobacter on 4/8,BCID negative.  ID was following.  Treated with Unasyn, completed antibiotics course.  He had intermittent fever while being on antibiotics, could be from DVT/PE. Afebrile today. Leukocytosis resolved.  Currently respiratory status is stable.  Sputum culture sent on 10/16/2020 showed normal flora.  Acute PE/bilateral lower leg DVT: Venous Doppler done on 4/10 showed acute DVT on right lower extremity, age-indeterminate DVT on left.  Status post IVC filter on 4/10.  CT angio on 4/20 showed bilateral PE.  He was on heparin drip,  changed to Eliquis.  Severe encephalopathy: Associated with intracerebral hemorrhage.  Had intermittent left upper extremity jerking.  Focal seizures could not be ruled out as per LTM EEG, it showed diffuse encephalopathy.  Angioedema: Much improved.  Concerning for Keppra Allergy.  Hypertension: Currently blood pressure stable.  Continue current regimen  Paroxysmal A. fib: Status post ablation/cardioversion in the past.  Was not taking anticoagulation when he was admitted.  Continue current anticoagulation, Eliquis.  On Cardizem for rate control  Urinary retention: Treated with  Urecholine.  Foley was placed.On doxazosin  Hyperglycemia: Continue current insulin regimen.  Monitor blood sugars.  Hemoglobin A1c of 5.7  Dysphagia: Speech therapy closely following.On feeding tube placement.  Also on dysphagia 1 diet.  Dietitian following.  Also on calorie count.  We may need to think about putting a PEG if oral  intake doe snot improve that much.  I discussed this with his wife and she will make a decision  Macrocytic anemia: Currently hemoglobin stable.  Continue monitoring  Lower extremity edema: We will give him 2 doses of IV Lasix 40 mg  Mild hypernatremia: Continue free water.  Monitor BMP  Macrocytic anemia: Hemoglobin ranging from 8-9.  Continue to monitor.  Most likely associated with acute illness, poor oral intake.  We will check vitamin J24 and folic acid  Pressure ulcers: Not present on admission.  Wound care following.  Pressure Injury 10/21/20 Head Left;Posterior Stage 1 -  Intact skin with non-blanchable redness of a localized area usually over a bony prominence. Pink non-blanchable scalp with thinning hair on posterior left side of head (Active)  10/21/20 1100  Location: Head  Location Orientation: Left;Posterior  Staging: Stage 1 -  Intact skin with non-blanchable redness of a localized area usually over a bony prominence.  Wound Description (Comments): Pink non-blanchable scalp with thinning hair on posterior left side of head  Present on Admission: No          Nutrition Problem: Inadequate oral intake Etiology: inability to eat      DVT prophylaxis:Eliquis Code Status: Full Family Communication: Discussed with wife on phone on 4/24/negative Status is: Inpatient  Remains inpatient appropriate because:Inpatient level of care appropriate due to severity of illness   Dispo: The patient is from: Home              Anticipated d/c is to: CIR              Patient currently is medically stable for discharge   Difficult to place patient No    Consultants: PCCM, neurology  Procedures: As above  Antimicrobials:  Anti-infectives (From admission, onward)   Start     Dose/Rate Route Frequency Ordered Stop   10/18/20 1100  Ampicillin-Sulbactam (UNASYN) 3 g in sodium chloride 0.9 % 100 mL IVPB        3 g 200 mL/hr over 30 Minutes Intravenous Every 6 hours 10/18/20  0931 10/21/20 0019   10/16/20 1100  Ampicillin-Sulbactam (UNASYN) 3 g in sodium chloride 0.9 % 100 mL IVPB  Status:  Discontinued        3 g 200 mL/hr over 30 Minutes Intravenous Every 6 hours 10/16/20 0948 10/18/20 0859   10/07/20 0000  vancomycin (VANCOREADY) IVPB 1500 mg/300 mL  Status:  Discontinued        1,500 mg 150 mL/hr over 120 Minutes Intravenous Every 12 hours 10/06/20 1000 10/07/20 1406   10/06/20 1200  ceFEPIme (MAXIPIME) 2 g in sodium chloride 0.9 % 100 mL IVPB        2 g 200 mL/hr over 30 Minutes Intravenous Every 8 hours 10/06/20 1000 10/13/20 2225   10/06/20 1100  vancomycin (VANCOREADY) IVPB 2000 mg/400 mL        2,000 mg 200 mL/hr over 120 Minutes Intravenous  Once 10/06/20 1000 10/06/20 1314   10/02/20 0900  Ampicillin-Sulbactam (UNASYN) 3 g in sodium chloride 0.9 % 100 mL IVPB  Status:  Discontinued        3 g 200 mL/hr over 30 Minutes Intravenous Every 6 hours 10/02/20 0812 10/06/20 0954   09/29/20 1130  cefTRIAXone (ROCEPHIN) 2 g in sodium chloride 0.9 % 100 mL IVPB  Status:  Discontinued        2 g 200 mL/hr  over 30 Minutes Intravenous Every 24 hours 09/29/20 1037 10/02/20 0350      Subjective: Patient seen and examined the bedside this morning.  Hemodynamically stable.  Overall comfortable, moving his upper extremities.  Awake but not oriented.  No new changes from yesterday.  Objective: Vitals:   10/22/20 0012 10/22/20 0309 10/22/20 0400 10/22/20 0500  BP: 135/63  122/67   Pulse:  76    Resp: '20 18 20   ' Temp: 98.5 F (36.9 C)  98.7 F (37.1 C)   TempSrc: Axillary  Axillary   SpO2: 100% 98% 95%   Weight:    102.7 kg  Height:        Intake/Output Summary (Last 24 hours) at 10/22/2020 0756 Last data filed at 10/22/2020 0500 Gross per 24 hour  Intake 2836 ml  Output 900 ml  Net 1936 ml   Filed Weights   10/20/20 0400 10/21/20 0400 10/22/20 0500  Weight: 96.1 kg 98.7 kg 102.7 kg    Examination:  General exam: Overall comfortable, not in  distress, deconditioned HEENT: Feeding tube, trach Respiratory system:  no wheezes or crackles  Cardiovascular system: S1 & S2 heard, RRR.  Gastrointestinal system: Abdomen is nondistended, soft and nontender. Central nervous system: Alert but not oriented, obeys commands, hemiplegic on the right side Extremities: Bilateral lower extremity edema, no clubbing ,no cyanosis Skin: Pressure ulcers on the buttock, rectal tube  Data Reviewed: I have personally reviewed following labs and imaging studies  CBC: Recent Labs  Lab 10/18/20 0734 10/19/20 0038 10/20/20 0427 10/21/20 0032 10/22/20 0326  WBC 11.1* 13.0* 10.7* 8.9 8.2  NEUTROABS  --   --   --   --  5.3  HGB 10.2* 9.7* 9.9* 9.2* 8.9*  HCT 32.4* 31.0* 32.4* 30.8* 28.9*  MCV 100.6* 100.0 102.9* 103.0* 102.8*  PLT 128* 140* 156 176 093   Basic Metabolic Panel: Recent Labs  Lab 10/16/20 0815 10/17/20 0642 10/18/20 0734 10/19/20 0038 10/19/20 2242 10/20/20 0427 10/21/20 0032 10/22/20 0326  NA 137 140 138 143 145 147* 146* 146*  K 4.2 4.2 3.9 3.6 4.1 3.8 3.7 4.1  CL 101 103 101 105 107 109 110 112*  CO2 '28 27 26 28 29 28 28 26  ' GLUCOSE 186* 216* 243* 166* 120* 124* 131* 136*  BUN 28* 42* 59* 58* 46* 37* 31* 24*  CREATININE 0.95 1.14 1.38* 1.25* 1.04 0.97 0.91 0.78  CALCIUM 8.6* 8.9 8.5* 8.6* 8.5* 8.7* 8.6* 8.7*  MG 2.5* 2.7* 3.0*  --  2.9*  --   --   --    GFR: Estimated Creatinine Clearance: 117.7 mL/min (by C-G formula based on SCr of 0.78 mg/dL). Liver Function Tests: Recent Labs  Lab 10/17/20 0642 10/18/20 0734 10/22/20 0326  AST 56* 61* 37  ALT 126* 136* 79*  ALKPHOS 129* 129* 116  BILITOT 0.8 1.0 0.8  PROT 6.6 6.3* 5.6*  ALBUMIN 2.8* 2.8* 2.5*   No results for input(s): LIPASE, AMYLASE in the last 168 hours. No results for input(s): AMMONIA in the last 168 hours. Coagulation Profile: No results for input(s): INR, PROTIME in the last 168 hours. Cardiac Enzymes: Recent Labs  Lab 10/18/20 0734   CKTOTAL 97   BNP (last 3 results) No results for input(s): PROBNP in the last 8760 hours. HbA1C: No results for input(s): HGBA1C in the last 72 hours. CBG: Recent Labs  Lab 10/21/20 1158 10/21/20 1628 10/21/20 2056 10/22/20 0036 10/22/20 0418  GLUCAP 116* 124* 157* 136* 142*  Lipid Profile: No results for input(s): CHOL, HDL, LDLCALC, TRIG, CHOLHDL, LDLDIRECT in the last 72 hours. Thyroid Function Tests: No results for input(s): TSH, T4TOTAL, FREET4, T3FREE, THYROIDAB in the last 72 hours. Anemia Panel: No results for input(s): VITAMINB12, FOLATE, FERRITIN, TIBC, IRON, RETICCTPCT in the last 72 hours. Sepsis Labs: No results for input(s): PROCALCITON, LATICACIDVEN in the last 168 hours.  Recent Results (from the past 240 hour(s))  Culture, Respiratory w Gram Stain     Status: None   Collection Time: 10/16/20 10:56 AM   Specimen: Tracheal Aspirate; Respiratory  Result Value Ref Range Status   Specimen Description TRACHEAL ASPIRATE  Final   Special Requests NONE  Final   Gram Stain   Final    RARE SQUAMOUS EPITHELIAL CELLS PRESENT MODERATE WBC PRESENT, PREDOMINANTLY PMN FEW YEAST FEW GRAM POSITIVE COCCI    Culture   Final    MODERATE Normal respiratory flora-no Staph aureus or Pseudomonas seen Performed at [facility], [place] [address], [place], [place] [address]    Report Status 10/18/2020 FINAL  Final         Radiology Studies: DG Swallowing Func-Speech Pathology  Result Date: 10/20/2020 Objective Swallowing Evaluation: Type of Study: MBS-Modified Barium Swallow Study  Patient Details Name: ADDIE CEDERBERG MRN: [id number] Date of Birth: December 15, 1955 Today's Date: 10/20/2020 Time: SLP Start Time (ACUTE ONLY): 0755 -SLP Stop Time (ACUTE ONLY): 0820 SLP Time Calculation (min) (ACUTE ONLY): 25 min Past Medical History: Past Medical History: Diagnosis Date . Atrial flutter ([place])  . Dizzy 09/25/13 . Near syncope 09/25/13 Past Surgical History: Past Surgical History:  Procedure Laterality Date . A-FLUTTER ABLATION N/A 03/09/2020  Procedure: A-FLUTTER ABLATION;  Surgeon: Thompson Grayer, MD;  Location: [place] CV LAB;  Service: Cardiovascular;  Laterality: N/A; . CARDIOVERSION N/A 09/27/2013  Procedure: CARDIOVERSION;  Surgeon: Lelon Perla, MD;  Location: [facility] ENDOSCOPY;  Service: Cardiovascular;  Laterality: N/A; . IR ANGIO EXTERNAL CAROTID SEL EXT CAROTID UNI R MOD SED  09/26/2020 . IR ANGIO INTRA EXTRACRAN SEL COM CAROTID INNOMINATE BILAT MOD SED  09/26/2020 . IR ANGIO VERTEBRAL SEL VERTEBRAL UNI R MOD SED  09/26/2020 . IR IVC FILTER PLMT / S&I /IMG GUID/MOD SED  10/08/2020 . IR US GUIDE VASC ACCESS RIGHT  09/26/2020 . RETINAL DETACHMENT SURGERY   . right knee arthroscopy   . TEE WITHOUT CARDIOVERSION N/A 09/27/2013  Procedure: TRANSESOPHAGEAL ECHOCARDIOGRAM (TEE);  Surgeon: Lelon Perla, MD;  Location: [facility] ENDOSCOPY;  Service: Cardiovascular;  Laterality: N/A; . WRIST SURGERY   HPI: 65 yo male presenting 3/28 with R-sided weakness and slurred speech. Imaging revealed acute hemorrhage centered within the left dorsal midbrain extending into the dorsal pons and inferomedial thalamus with intraventricular extension. EVD placement on 3/29. Extubated 3/30. Decrease in EVD drainage 3/31. Repeat CT shows significant clot in R lateral ventricle. EVD clotted 4/2 and intraventricular TpA administered. Reintubated 4/4. Tracheostomy 4/6. PMH Afib, COVID infection (July 2021), and retinal detachment.  Recent CXR showed. Mild right base subsegmental  atelectasis/infiltrate.  Subjective: sleepy but did surprisingly awake enough to participate Assessment / Plan / Recommendation CHL IP CLINICAL IMPRESSIONS 10/20/2020 Clinical Impression Patient was tested with PMSV in place.  He presents with moderate oral and funcitonal pharyngeal swallow ability.  He was sleeping in chair prior to MBS but did wake adequately to accept po intake and held cup with left hand and brought to mouth.  Lingual weakness  and discoordination due to hypoglossal nerve deficit results in premature spillage of  liquids and delayed oral transiting with puree/cracker bolus.  Pt's pharyngeal swallow triggers at vallecula with boluses and was overall strong. NO aspiration or penetration observed. Pt masticated tablet despite cues to swallow whole - due to his cognition.  Largest aspiration/malnutrition risk is due to oral deficits and his lethargy. Encourage calorie count to assure he is meeting nutritional needs and assess for oral retention after po.  Will follow up for dysphagia, dysarthria and cognitive linguistic treatment. SLP Visit Diagnosis Dysphagia, oral phase (R13.11) Attention and concentration deficit following -- Frontal lobe and executive function deficit following -- Impact on safety and function Moderate aspiration risk;Risk for inadequate nutrition/hydration   CHL IP TREATMENT RECOMMENDATION 10/20/2020 Treatment Recommendations Therapy as outlined in treatment plan below   Prognosis 10/20/2020 Prognosis for Safe Diet Advancement Good Barriers to Reach Goals -- Barriers/Prognosis Comment -- CHL IP DIET RECOMMENDATION 10/20/2020 SLP Diet Recommendations Dysphagia 1 (Puree) solids;Thin liquid Liquid Administration via Straw;Cup Medication Administration Crushed with puree Compensations Slow rate;Small sips/bites Postural Changes Remain semi-upright after after feeds/meals (Comment);Seated upright at 90 degrees   CHL IP OTHER RECOMMENDATIONS 10/20/2020 Recommended Consults -- Oral Care Recommendations Oral care QID Other Recommendations Have oral suction available;Place PMSV during PO intake;Clarify dietary restrictions   CHL IP FOLLOW UP RECOMMENDATIONS 10/20/2020 Follow up Recommendations Inpatient Rehab   CHL IP FREQUENCY AND DURATION 10/20/2020 Speech Therapy Frequency (ACUTE ONLY) min 2x/week Treatment Duration 2 weeks      CHL IP ORAL PHASE 10/20/2020 Oral Phase Impaired Oral - Pudding Teaspoon -- Oral - Pudding Cup -- Oral -  Honey Teaspoon -- Oral - Honey Cup -- Oral - Nectar Teaspoon Weak lingual manipulation;Premature spillage;Delayed oral transit Oral - Nectar Cup -- Oral - Nectar Straw Weak lingual manipulation Oral - Thin Teaspoon Weak lingual manipulation;Premature spillage Oral - Thin Cup Premature spillage;Weak lingual manipulation Oral - Thin Straw Weak lingual manipulation;Premature spillage Oral - Puree Lingual pumping;Premature spillage;Delayed oral transit Oral - Mech Soft Lingual pumping;Delayed oral transit;Impaired mastication;Premature spillage Oral - Regular -- Oral - Multi-Consistency -- Oral - Pill Other (Comment);Lingual pumping;Weak lingual manipulation;Delayed oral transit Oral Phase - Comment pt masticated tablet  CHL IP PHARYNGEAL PHASE 10/20/2020 Pharyngeal Phase WFL Pharyngeal- Pudding Teaspoon -- Pharyngeal -- Pharyngeal- Pudding Cup -- Pharyngeal -- Pharyngeal- Honey Teaspoon -- Pharyngeal -- Pharyngeal- Honey Cup -- Pharyngeal -- Pharyngeal- Nectar Teaspoon -- Pharyngeal -- Pharyngeal- Nectar Cup -- Pharyngeal -- Pharyngeal- Nectar Straw -- Pharyngeal -- Pharyngeal- Thin Teaspoon -- Pharyngeal -- Pharyngeal- Thin Cup -- Pharyngeal -- Pharyngeal- Thin Straw -- Pharyngeal -- Pharyngeal- Puree -- Pharyngeal -- Pharyngeal- Mechanical Soft -- Pharyngeal -- Pharyngeal- Regular -- Pharyngeal -- Pharyngeal- Multi-consistency -- Pharyngeal -- Pharyngeal- Pill -- Pharyngeal -- Pharyngeal Comment --  CHL IP CERVICAL ESOPHAGEAL PHASE 10/20/2020 Cervical Esophageal Phase WFL Pudding Teaspoon -- Pudding Cup -- Honey Teaspoon -- Honey Cup -- Nectar Teaspoon -- Nectar Cup -- Nectar Straw -- Thin Teaspoon -- Thin Cup -- Thin Straw -- Puree -- Mechanical Soft -- Regular -- Multi-consistency -- Pill -- Cervical Esophageal Comment -- Kathleen Lime, MS [facility] SLP Acute Rehab Services Office [phone] Pager [phone] Macario Golds 10/20/2020, 9:02 AM                   Scheduled Meds: . amantadine  200 mg Per Tube BID  .  apixaban  5 mg Per Tube BID  . chlorhexidine gluconate (MEDLINE KIT)  15 mL Mouth Rinse BID  . Chlorhexidine Gluconate Cloth  6 each Topical Daily  .  diltiazem  30 mg Per Tube Q6H  . doxazosin  2 mg Per Tube Daily  . feeding supplement  237 mL Oral BID BM  . feeding supplement (PROSource TF)  45 mL Per Tube TID  . feeding supplement (VITAL 1.5 CAL)  960 mL Per Tube Q24H  . fiber  1 packet Oral BID  . free water  200 mL Per Tube Q4H  . insulin aspart  0-20 Units Subcutaneous Q4H  . insulin glargine  6 Units Subcutaneous Daily  . mouth rinse  15 mL Mouth Rinse 10 times per day  . ondansetron (ZOFRAN) IV  4 mg Intravenous Q6H  . sodium chloride flush  10-40 mL Intracatheter Q12H   Continuous Infusions: . sodium chloride Stopped (10/19/20 1506)     LOS: 27 days    Time spent: 55 mins,More than 50% of that time was spent in counseling and/or coordination of care.      Shelly Coss, MD [facility] P4/24/2022, 7:56 AM

## 2020-10-22 NOTE — Plan of Care (Signed)
  Problem: Education: Goal: Knowledge of General Education information will improve Description: Including pain rating scale, medication(s)/side effects and non-pharmacologic comfort measures Outcome: Progressing   Problem: Health Behavior/Discharge Planning: Goal: Ability to manage health-related needs will improve Outcome: Progressing   Problem: Clinical Measurements: Goal: Ability to maintain clinical measurements within normal limits will improve Outcome: Progressing Goal: Will remain free from infection Outcome: Progressing Goal: Diagnostic test results will improve Outcome: Progressing Goal: Respiratory complications will improve Outcome: Progressing Goal: Cardiovascular complication will be avoided Outcome: Progressing   Problem: Activity: Goal: Risk for activity intolerance will decrease Outcome: Progressing   Problem: Nutrition: Goal: Adequate nutrition will be maintained Outcome: Progressing   Problem: Coping: Goal: Level of anxiety will decrease Outcome: Progressing   Problem: Elimination: Goal: Will not experience complications related to bowel motility Outcome: Progressing Goal: Will not experience complications related to urinary retention Outcome: Progressing   Problem: Pain Managment: Goal: General experience of comfort will improve Outcome: Progressing   Problem: Safety: Goal: Ability to remain free from injury will improve Outcome: Progressing   Problem: Skin Integrity: Goal: Risk for impaired skin integrity will decrease Outcome: Progressing   Problem: Education: Goal: Knowledge of disease or condition will improve Outcome: Progressing Goal: Knowledge of secondary prevention will improve Outcome: Progressing Goal: Knowledge of patient specific risk factors addressed and post discharge goals established will improve Outcome: Progressing Goal: Individualized Educational Video(s) Outcome: Progressing   Problem: Coping: Goal: Will verbalize  positive feelings about self Outcome: Progressing Goal: Will identify appropriate support needs Outcome: Progressing   Problem: Health Behavior/Discharge Planning: Goal: Ability to manage health-related needs will improve Outcome: Progressing   Problem: Self-Care: Goal: Ability to participate in self-care as condition permits will improve Outcome: Progressing Goal: Verbalization of feelings and concerns over difficulty with self-care will improve Outcome: Progressing Goal: Ability to communicate needs accurately will improve Outcome: Progressing   Problem: Nutrition: Goal: Risk of aspiration will decrease Outcome: Progressing Goal: Dietary intake will improve Outcome: Progressing   Problem: Intracerebral Hemorrhage Tissue Perfusion: Goal: Complications of Intracerebral Hemorrhage will be minimized Outcome: Progressing   Problem: Safety: Goal: Non-violent Restraint(s) Outcome: Progressing   

## 2020-10-22 NOTE — Progress Notes (Signed)
STROKE TEAM PROGRESS NOTE   INTERVAL HISTORY He is more drowsy this morning but still following commands.    Vitals:   10/22/20 0309 10/22/20 0400 10/22/20 0500 10/22/20 0814  BP:  122/67  121/64  Pulse: 76   76  Resp: 18 20  20   Temp:  98.7 F (37.1 C)  98.4 F (36.9 C)  TempSrc:  Axillary  Axillary  SpO2: 98% 95%  97%  Weight:   102.7 kg   Height:       CBC:  Recent Labs  Lab 10/21/20 0032 10/22/20 0326  WBC 8.9 8.2  NEUTROABS  --  5.3  HGB 9.2* 8.9*  HCT 30.8* 28.9*  MCV 103.0* 102.8*  PLT 176 150   Basic Metabolic Panel:  Recent Labs  Lab 10/18/20 0734 10/19/20 0038 10/19/20 2242 10/20/20 0427 10/21/20 0032 10/22/20 0326  NA 138   < > 145   < > 146* 146*  K 3.9   < > 4.1   < > 3.7 4.1  CL 101   < > 107   < > 110 112*  CO2 26   < > 29   < > 28 26  GLUCOSE 243*   < > 120*   < > 131* 136*  BUN 59*   < > 46*   < > 31* 24*  CREATININE 1.38*   < > 1.04   < > 0.91 0.78  CALCIUM 8.5*   < > 8.5*   < > 8.6* 8.7*  MG 3.0*  --  2.9*  --   --   --    < > = values in this interval not displayed.    IMAGING  CT head 4/6 1. Ventricle size shows mild interval enlargement since yesterday. No significant hydrocephalus. Intraventricular hemorrhage stable 2. Right frontal ventricular drain unchanged. Hemorrhage surrounding the drain unchanged. 3. Subarachnoid hemorrhage unchanged. Hemorrhage in the left tectum unchanged. No new hemorrhage identified.  CT head 4/4 1. Interval decrease in intraventricular hemorrhage with decreased size of the lateral ventricles as compared to prior, right greater than left. Right parietal approach ventriculostomy in stable position with tip near the septum pellucidum. 2. Stable small volume subarachnoid and intraparenchymal hemorrhage along the catheter tract. 3. Stable 1.9 cm hemorrhage at the left dorsal pons. 4. No other new acute intracranial abnormality. 09/29/2020  CT head 4/3 IMPRESSION: 1. New intraventricular  hemorrhage in the third and lateral ventricles. Fourth ventricular and midbrain hemorrhage is unchanged. 2. New right frontal EVD which traverses the right lateral ventricle. Blood clot encompasses the lower catheter and ventriculomegaly is similar to prior. 3. Small cortical infarcts are newly seen along the superior left frontal convexity.  09/26/2020 IR Angio IMPRESSION: 1. No evidence of an AVM, dural arteriovenous fistula, aneurysm or other vascular abnormality to explain patient's brainstem hemorrhage.  09/26/2020 CT head:  IMPRESSION: 1. Unchanged left brainstem hemorrhage extending into the fourth ventricle. 2. Worsening hydrocephalus.  09/25/2020 MRI brain w/wo, MR Venogram IMPRESSION: 1. Acute hemorrhage centered within the left dorsal midbrain extending into the dorsal pons and inferomedial thalamus with intraventricular extension. No abnormal enhancement to suggest underlying lesion. 2. Compression of the cerebral aqueduct with possible mild obstructive hydrocephalus. 3. No evidence of dural sinus thrombosis. Attenuation of the left basal vein in the quadrigeminal cistern without definite occlusion.  09/25/2020 CT Angio head/neck IMPRESSION: 1. No vascular malformation, anomaly or aneurysm. 2. No emergent large vessel occlusion or high-grade stenosis of the intracranial arteries. 3. Unchanged size of  dorsal left pontine hemorrhage with intraventricular extension.  09/25/2020 CT head code stroke IMPRESSION: Acute intraparenchymal hemorrhage in the dorsal left pons with subarachnoid extension into the cerebral aqueduct and fourth Ventricle.  EEG-LTM 4/5-4/6  This study showed periodic discharges with triphasic morphology at 1.5 to 2.5 Hz which is on the ictal-interictal continuum with low to intermediate potential for seizures.  Additionally there is evidence of moderate to severe diffuse encephalopathy, nonspecific etiology.  No seizures were seen during the  study  EEG adult 10/13/2020 This study is suggestive of moderate diffuse encephalopathy, nonspecific etiology. No seizures or definite epileptiform discharges were seen throughout the recording. [name]   CT HEAD WO CONTRAST  Result Date: 10/17/2020 CLINICAL DATA:  Follow-up intracranial hemorrhage EXAM: CT HEAD WITHOUT CONTRAST TECHNIQUE: Contiguous axial images were obtained from the base of the skull through the vertex without intravenous contrast. COMPARISON:  10/11/2020 FINDINGS: Brain: Intraparenchymal hemorrhage in the left side the upper pons and midbrain no longer shows any hyperdense components. There is an intra-axial cystic space measuring up to 15 mm in diameter, which may communicate with the fourth ventricle. No focal cerebellar abnormality is seen. Continuing diminishing density of blood along the ventriculostomy track in the right frontal region. Persistent brain edema surrounding that. Small amount of subarachnoid blood possibly subdural blood the ventriculostomy entry site appears similar. No evidence of any new or increased bleeding. Small cortical infarctions of the left frontoparietal vertex are barely appreciable. No evidence of new brain infarction. Ventricular size remains stable without evidence of ongoing aqueductal obstruction. Previously seen hyperdense blood dependent within the occipital horns is diminishing. Vascular: No vascular finding. Skull: Normal other than the right frontal burr hole. Sinuses/Orbits: Paranasal sinuses are clear. Orbits are normal. No fluid in the middle ears. Small mastoid effusions. Other: None IMPRESSION: 1. Continuing diminishing density of blood along the ventriculostomy track in the right frontal region. No evidence of any new or increased bleeding. Diminishing dependent intraventricular blood in the lateral ventricles. Very small amount of subarachnoid blood and possibly subdural blood at the ventriculostomy entry site appears similar. 2.  Small cortical infarctions of the left frontoparietal vertex are barely appreciable. No evidence of new or increased infarction. 3. Ventricular size remains stable without evidence of ongoing aqueductal obstruction. 4. 15 mm intra-axial cystic space in the left side of the upper pons and midbrain at the site of the previous hematoma. This may communicate with the fourth ventricle. Electronically Signed   By: Paulina Fusi M.D.   On: 10/17/2020 07:35   CT HEAD WO CONTRAST  Result Date: 10/17/2020 CLINICAL DATA:  Follow-up intracranial hemorrhage EXAM: CT HEAD WITHOUT CONTRAST TECHNIQUE: Contiguous axial images were obtained from the base of the skull through the vertex without intravenous contrast. COMPARISON:  10/11/2020 FINDINGS: Brain: Intraparenchymal hemorrhage in the left side the upper pons and midbrain no longer shows any hyperdense components. There is an intra-axial cystic space measuring up to 15 mm in diameter, which may communicate with the fourth ventricle. No focal cerebellar abnormality is seen. Continuing diminishing density of blood along the ventriculostomy track in the right frontal region. Persistent brain edema surrounding that. Small amount of subarachnoid blood possibly subdural blood the ventriculostomy entry site appears similar. No evidence of any new or increased bleeding. Small cortical infarctions of the left frontoparietal vertex are barely appreciable. No evidence of new brain infarction. Ventricular size remains stable without evidence of ongoing aqueductal obstruction. Previously seen hyperdense blood dependent within the occipital horns is diminishing.  Vascular: No vascular finding. Skull: Normal other than the right frontal burr hole. Sinuses/Orbits: Paranasal sinuses are clear. Orbits are normal. No fluid in the middle ears. Small mastoid effusions. Other: None IMPRESSION: 1. Continuing diminishing density of blood along the ventriculostomy track in the right frontal region. No  evidence of any new or increased bleeding. Diminishing dependent intraventricular blood in the lateral ventricles. Very small amount of subarachnoid blood and possibly subdural blood at the ventriculostomy entry site appears similar. 2. Small cortical infarctions of the left frontoparietal vertex are barely appreciable. No evidence of new or increased infarction. 3. Ventricular size remains stable without evidence of ongoing aqueductal obstruction. 4. 15 mm intra-axial cystic space in the left side of the upper pons and midbrain at the site of the previous hematoma. This may communicate with the fourth ventricle. Electronically Signed   By: Paulina FusiMark  Shogry M.D.   On: 10/17/2020 07:35   CT CHEST W CONTRAST  Result Date: 10/17/2020 CLINICAL DATA:  Fever of unknown origin. EXAM: CT CHEST, ABDOMEN, AND PELVIS WITH CONTRAST TECHNIQUE: Multidetector CT imaging of the chest, abdomen and pelvis was performed following the standard protocol during bolus administration of intravenous contrast. CONTRAST:  100mL OMNIPAQUE IOHEXOL 300 MG/ML  SOLN COMPARISON:  Plain film 10/17/2020.  No prior CTs. FINDINGS: CT CHEST FINDINGS Cardiovascular: Aortic atherosclerosis. Normal heart size, without pericardial effusion. Bilateral pulmonary emboli are identified on this non dedicated exam. Example in the central right pulmonary artery on 28/3. Branch pulmonary arteries to both upper and lower lobes on 32/3. Within the left upper lobe pulmonary artery branch on 20/3. Mediastinum/Nodes: No mediastinal or hilar adenopathy. Lungs/Pleura: No pleural fluid. Endotracheal tube terminates appropriately. Areas of mild peripheral predominant airspace and ground-glass are indeterminate. Example at the left apex on 22/5 and right lower lobe on 58/5. More dependent bibasilar airspace opacities are likely due to atelectasis. Musculoskeletal: No acute osseous abnormality. CT ABDOMEN PELVIS FINDINGS Hepatobiliary: Mild hepatomegaly at 18.4 cm  craniocaudal. Normal gallbladder, without biliary ductal dilatation. Pancreas: Normal, without mass or ductal dilatation. Spleen: Normal in size, without focal abnormality. Adrenals/Urinary Tract: Normal adrenal glands. Bilateral renal sinus cysts, without hydronephrosis. Foley catheter within the urinary bladder. Stomach/Bowel: Nasogastric tube terminating at the gastric antrum. Rectal catheter in place. Scattered colonic diverticula. Normal terminal ileum and appendix. Normal small bowel. Vascular/Lymphatic: Aortic atherosclerosis. IVC filter is appropriately positioned. No abdominopelvic adenopathy. Reproductive: Mild prostatomegaly. Other: No significant free fluid. No free intraperitoneal air. Mild pelvic anasarca. tiny bilateral fat containing inguinal hernias. Musculoskeletal: Degenerative partial fusion of bilateral sacroiliac joints in. Degenerate disc disease at the lumbosacral junction. IMPRESSION: 1. Bilateral, large volume right greater than left pulmonary emboli on this nondedicated exam. No evidence of right heart strain. 2. Mild peripheral ground-glass opacities, favoring atelectasis or infarct. Atypical infection felt less likely. 3. Otherwise, no explanation for fever. 4.  Aortic Atherosclerosis (ICD10-I70.0). 5. Prostatomegaly. These results will be called to the ordering clinician or representative by the Radiologist Assistant, and communication documented in the PACS or [employer]. Electronically Signed   By: Jeronimo GreavesKyle  Talbot M.D.   On: 10/17/2020 14:43   CT ABDOMEN PELVIS W CONTRAST  Result Date: 10/17/2020 CLINICAL DATA:  Fever of unknown origin. EXAM: CT CHEST, ABDOMEN, AND PELVIS WITH CONTRAST TECHNIQUE: Multidetector CT imaging of the chest, abdomen and pelvis was performed following the standard protocol during bolus administration of intravenous contrast. CONTRAST:  100mL OMNIPAQUE IOHEXOL 300 MG/ML  SOLN COMPARISON:  Plain film 10/17/2020.  No prior CTs. FINDINGS: CT  CHEST FINDINGS  Cardiovascular: Aortic atherosclerosis. Normal heart size, without pericardial effusion. Bilateral pulmonary emboli are identified on this non dedicated exam. Example in the central right pulmonary artery on 28/3. Branch pulmonary arteries to both upper and lower lobes on 32/3. Within the left upper lobe pulmonary artery branch on 20/3. Mediastinum/Nodes: No mediastinal or hilar adenopathy. Lungs/Pleura: No pleural fluid. Endotracheal tube terminates appropriately. Areas of mild peripheral predominant airspace and ground-glass are indeterminate. Example at the left apex on 22/5 and right lower lobe on 58/5. More dependent bibasilar airspace opacities are likely due to atelectasis. Musculoskeletal: No acute osseous abnormality. CT ABDOMEN PELVIS FINDINGS Hepatobiliary: Mild hepatomegaly at 18.4 cm craniocaudal. Normal gallbladder, without biliary ductal dilatation. Pancreas: Normal, without mass or ductal dilatation. Spleen: Normal in size, without focal abnormality. Adrenals/Urinary Tract: Normal adrenal glands. Bilateral renal sinus cysts, without hydronephrosis. Foley catheter within the urinary bladder. Stomach/Bowel: Nasogastric tube terminating at the gastric antrum. Rectal catheter in place. Scattered colonic diverticula. Normal terminal ileum and appendix. Normal small bowel. Vascular/Lymphatic: Aortic atherosclerosis. IVC filter is appropriately positioned. No abdominopelvic adenopathy. Reproductive: Mild prostatomegaly. Other: No significant free fluid. No free intraperitoneal air. Mild pelvic anasarca. tiny bilateral fat containing inguinal hernias. Musculoskeletal: Degenerative partial fusion of bilateral sacroiliac joints in. Degenerate disc disease at the lumbosacral junction. IMPRESSION: 1. Bilateral, large volume right greater than left pulmonary emboli on this nondedicated exam. No evidence of right heart strain. 2. Mild peripheral ground-glass opacities, favoring atelectasis or infarct. Atypical  infection felt less likely. 3. Otherwise, no explanation for fever. 4.  Aortic Atherosclerosis (ICD10-I70.0). 5. Prostatomegaly. These results will be called to the ordering clinician or representative by the Radiologist Assistant, and communication documented in the PACS or [employer]. Electronically Signed   By: Jeronimo Greaves M.D.   On: 10/17/2020 14:43     PHYSICAL EXAM  Temp:  [98.1 F (36.7 C)-99.3 F (37.4 C)] 98.4 F (36.9 C) (04/24 0814) Pulse Rate:  [71-83] 76 (04/24 0814) Resp:  [15-30] 20 (04/24 0814) BP: (121-147)/(63-78) 121/64 (04/24 0814) SpO2:  [94 %-100 %] 97 % (04/24 0814) FiO2 (%):  [28 %] 28 % (04/24 0900) Weight:  [102.7 kg] 102.7 kg (04/24 0500)  General - Well nourished, well developed, on trach collar  Ophthalmologic - fundi not visualized due to noncooperation.  Cardiovascular - Regular rate and rhythm, not in afib.  Neuro - on trach collar now, still has copious secretions; he opens his eyes intermittently but more drowsy today.  He does follow commands consistently both midline and appendicular.  He Motts a few words at times they are difficult to understand.  Right upper extremity 3/5 and left upper extremity 4 -/5.  Legs are 2/5.   ASSESSMENT/PLAN Walter [name] is a 65 y.o. male with history of atrial flutter newly diagnosed with atrial fibrillation in February 2022 not on anticoagulation, follows with Dr. Johney Frame in cardiology.  He presented with right-sided weakness, slurred speech, headache, and vomiting.Last known well when he went to bed at 10 PM on 09/24/2020 and woke up at around 2 AM on the day of admission with slurred speech.  EMS was called.He was very drowsy upon their assessment and notable right side weakeness.  He had worsening mental status, GCS 5, so was emergently intubated in ED for airway protection.  His MRI brain showed acute hemorrhage at left midbrain extending into pons with IVH associated with obstructive hydrocephalus.     ICH - Left Dorsalmidbrain ICHextending into dorsal pons and inferior  thalamus with mild mass-effect on third ventricle and hydrocephalus,source unknown, suspect occult cavernoma  Code Stroke CT head-Acute IPH in the dorsal left pons with subarachnoid extension into the cerebral aqueduct and fourth ventricle.  CTA head & neck-No vascular malformation, anomaly or aneurysm. No emergent large vessel occlusion or high-grade stenosis. Unchanged size of dorsal left pontine hemorrhage with intraventricular extension.  MRI& MR Venogram-Acute hemorrhage centered within the left dorsal midbrain extending into the dorsal pons and inferomedial thalamus with intraventricular extension. Compression of the cerebral aqueduct with possible mild obstructive hydrocephalus. No evidence of dural sinus thrombosis.   CT Head W/O Contrast 3/29 0250 -Unchanged left brainstem hemorrhage extending into the fourth ventricle. Worsening hydrocephalus.  Diagnostic Cerebral Angiogram - No evidence of an aneurysm, AVM, dural AV fistula or other vascular abnormality to explain patient's known intraparenchymal hemorrhage.  MRI brain 4/8 -stable IVH, midbrain ICH and right frontal EVD tract hemorrhage.  Ventricular size stable.  2-3 punctate infarcts left frontal and parietal lobes, corresponding to small acute/subacute infarcts.  CT head 4/13 stable IVH, ICH, decreased ventricle size  CT repeat 4/19 continue evolution of ICH and IVH, nearly absorbed now  2D Echo- EF:55-60%. No wall motion abnormalities.  LDL- 88  HgbA1c5.7  VTE prophylaxis - subcu heparin  No antithromboticprior to admission, on heparin IV.  We will switch to Eliquis.  Therapy recommendations:CIR vs LTAC  Disposition:Pending  Obstructive hydrocephalus   EVD placed on 3/30   CT 4/1 showed clot in right lateral ventricle around catheter.   S/p 1mg  (1cc) alteplase placed in EVD 09/29/20 -> drain well ->ICP measuring 4-11 with a  good waveform  CT head (4/3) resolution of most of the IVH and improved hydrocephalus.  EVD stopped working 4/2 overnight. 0.5mg  alteplase placed in EVD 10/01/2020 -> Post alteplase CT head new hemorrhage along the EVD tract an in the right lateral ventricle.  CT repeat 4/4 - decreased IVH and decompressed right lateral ventricle  CT repeat 4/5 -mild interval enlargement of ventricular size, no significant hydrocephalus.  IVH stable.  HCT 4/7 stable, with no appreciable change in overall ventricular size  HCT 4/13 decreased ventricular size  CT repeat 4/19 continue evolution of ICH and IVH, nearly absorbed now  4/16 will remove scalp staples.   Acute Respiratory Failure  Required intubation on 4/4 due to worsening MS and hypoxia  S/p trach 10/04/20  Off sedation  On trach collar with improving secretions -> speaking valve  On robitussin PRN   Bilateral DVT Bilateral, large volume right greater than left PE  LE venous Doppler 4/10 with bilateral DVTs  Status post IVC filter  CT chest showed bilateral PE, which I believe related to her DVT found on 4/10, not after IVC filter  On heparin IV, will switch to Eliquis  [facility] Pneumonia [facility]-acquired pneumonia with Moraxella Fever   Tmax 100.6->100.4->101.5->101.7->afebrile->100.1  Off arctic sun  Sputum culture (3/30): Moraxella  Sputum culture 4/5: neg  WBC 9.8->8.5->8.7->8.9->10.6->12.2->13.0->10.7  CXR 4/7, 4/8 left basilar opacities  CXR 4/19 unremarkable  Blood culture 4/8 positive for Acinetobacter  Off cefepime and vanco -> unasyn (off 4/23)  PICC removed  ID on board - CT abd/pelvis/chest no infection source found  SIRPIDs, resolved Severe encephalopathy, improving  Left UE intermittent jerking with stimulation  LTM EEG: GPEDs with triphasic wave, generalized SIRPIDs, although focal seizure is in DDx  S/p keppra 10/03/20 -> d/c concerning for angioedema -> on vimpat 10/04/20-> off vimpat  10/11/20  EEG 4/4 moderate to severe diffuse encephalopathy  Repeat spot EEG 4/15 moderate diffuse encephalopathy  Angioedema, resolved  Upper lip edema, much improved  Concerning for keppra allergy  Hypertension  Home meds:Diltiazem  Currently on Diltiazem 30 mg q6  off clonidine  BP goal Systolic < 160  Long-term BP goal normotensive  Hx of Aflutter PAF  Not on [facility] before admission  S/p CTI ablation for A flutter  off ASA  Not AC candidate at this time.  Afib with RVR intermittent -> sinus now  On heparin IV, will switch to Eliquis  Hyperlipidemia  Home meds:none  LDL88, goal < 70  AST/ALT 56/126->61/136  No statin now due to elevated LFTs  May consider to addstatin on discharge if LFT normalized  Dysphagia  Secondary to hemorrhagic stroke  Passed swallow  On Dys 1 and thin liquid  SLP on board  Tube feeds @ 28ml/hr for nocturnal feeding  Encourage po intake  Calorie count ordered  Other Stroke Risk Factors  Advanced Age >/= 65  Snoring: sleep study denied by insurance prior to admission   Other Active Problems:  Hypokalemia  AKI Cre 0.95->1.14->1.38->1.25, on TF  Lethargic - on amantadine   Wound care consulted for buttock wound due to diarrhea  Hospital day #27        10/22/2020 11:05 AM

## 2020-10-23 DIAGNOSIS — Z430 Encounter for attention to tracheostomy: Secondary | ICD-10-CM

## 2020-10-23 DIAGNOSIS — E870 Hyperosmolality and hypernatremia: Secondary | ICD-10-CM | POA: Diagnosis not present

## 2020-10-23 DIAGNOSIS — J9602 Acute respiratory failure with hypercapnia: Secondary | ICD-10-CM | POA: Diagnosis not present

## 2020-10-23 DIAGNOSIS — I613 Nontraumatic intracerebral hemorrhage in brain stem: Secondary | ICD-10-CM | POA: Diagnosis not present

## 2020-10-23 DIAGNOSIS — L8990 Pressure ulcer of unspecified site, unspecified stage: Secondary | ICD-10-CM | POA: Insufficient documentation

## 2020-10-23 LAB — CBC WITH DIFFERENTIAL/PLATELET
Abs Immature Granulocytes: 0.04 10*3/uL (ref 0.00–0.07)
Basophils Absolute: 0 10*3/uL (ref 0.0–0.1)
Basophils Relative: 0 %
Eosinophils Absolute: 0.7 10*3/uL — ABNORMAL HIGH (ref 0.0–0.5)
Eosinophils Relative: 9 %
HCT: 29.6 % — ABNORMAL LOW (ref 39.0–52.0)
Hemoglobin: 9.2 g/dL — ABNORMAL LOW (ref 13.0–17.0)
Immature Granulocytes: 1 %
Lymphocytes Relative: 18 %
Lymphs Abs: 1.5 10*3/uL (ref 0.7–4.0)
MCH: 31.8 pg (ref 26.0–34.0)
MCHC: 31.1 g/dL (ref 30.0–36.0)
MCV: 102.4 fL — ABNORMAL HIGH (ref 80.0–100.0)
Monocytes Absolute: 0.3 10*3/uL (ref 0.1–1.0)
Monocytes Relative: 4 %
Neutro Abs: 5.8 10*3/uL (ref 1.7–7.7)
Neutrophils Relative %: 68 %
Platelets: 175 10*3/uL (ref 150–400)
RBC: 2.89 MIL/uL — ABNORMAL LOW (ref 4.22–5.81)
RDW: 14.8 % (ref 11.5–15.5)
WBC: 8.4 10*3/uL (ref 4.0–10.5)
nRBC: 0 % (ref 0.0–0.2)

## 2020-10-23 LAB — GLUCOSE, CAPILLARY
Glucose-Capillary: 133 mg/dL — ABNORMAL HIGH (ref 70–99)
Glucose-Capillary: 133 mg/dL — ABNORMAL HIGH (ref 70–99)
Glucose-Capillary: 131 mg/dL — ABNORMAL HIGH (ref 70–99)
Glucose-Capillary: 114 mg/dL — ABNORMAL HIGH (ref 70–99)
Glucose-Capillary: 132 mg/dL — ABNORMAL HIGH (ref 70–99)

## 2020-10-23 LAB — BASIC METABOLIC PANEL
Anion gap: 5 (ref 5–15)
BUN: 22 mg/dL (ref 8–23)
CO2: 28 mmol/L (ref 22–32)
Calcium: 8.7 mg/dL — ABNORMAL LOW (ref 8.9–10.3)
Chloride: 107 mmol/L (ref 98–111)
Creatinine, Ser: 0.82 mg/dL (ref 0.61–1.24)
GFR, Estimated: >60 mL/min (ref >60)
Glucose, Bld: 153 mg/dL — ABNORMAL HIGH (ref 70–99)
Potassium: 4.4 mmol/L (ref 3.5–5.1)
Sodium: 140 mmol/L (ref 135–145)

## 2020-10-23 MED ORDER — WHITE PETROLATUM EX OINT
TOPICAL_OINTMENT | CUTANEOUS | Status: AC
  Administered 2020-10-23: 0.2
  Filled 2020-10-23: qty 28.35

## 2020-10-23 MED ORDER — AQUAPHOR EX OINT
TOPICAL_OINTMENT | Freq: Two times a day (BID) | CUTANEOUS | Status: DC | PRN
  Filled 2020-10-23: qty 50

## 2020-10-23 NOTE — Consult Note (Signed)
WOC Nurse Consult Note: Moisture associated skin breakdown to buttocks Stage 2 sacral pressure injury Device related pressure injury to perirectal area from fecal management system Reason for Consult:Multiple areas of breakdown.  Admitted with hemorrhagic stroke and right sided weakness. HE will soon discharge to CIR Wound type:pressure and moisture Pressure Injury POA: No Measurement:perirectal skin with partial thickness tissue loss Sacrum:  1 cm x 2 cm x 0.1 cm  Left and right gluteal folds with 2 cm x 2 cm x 0.1 cm partial thickness tissue loss in the fold.  Wound PHX:TAVW and moist Drainage (amount, consistency, odor) scant weeping Periwound:intact  Was experiencing frequent exposure to moisture from urine and loose stool.  Now resolved  Condom cath in place Dressing procedure/placement/frequency:Cleanse sacral, buttocks and perirectal skin with soap and water and pat dry. Apply Aquaphor ointment each shift and PRN soilage/moisture.  Will not follow at this time.  Please re-consult if needed.  Maple Hudson MSN, RN, FNP-BC CWON Wound, Ostomy, Continence Nurse Pager [phone]

## 2020-10-23 NOTE — Progress Notes (Signed)
PROGRESS NOTE    Walter [name]  JFH:[id number] DOB: 10-Jan-1956 DOA: 09/25/2020 PCP: Alroy Dust, L.Marlou Sa, MD   Chief Complain: Weakness, slurred speech  Brief Narrative: Patient is a 65 year old male with history of A. fib not on anticoagulation status post ablation and conversion, COVID infection in July 2021 who initially presented with right-sided weakness, slurred speech.  Work-up revealed hemorrhagic stroke.  He was emergently intubated in the ED for airway protection and was admitted under PCCM service.  Hospital course was prolonged and remarkable for persistent hypoxia, pneumonia, encephalopathy, PE/bilateral DVT status post IVC filter placement.  Status post trach .  Currently on trach collar.  Transferred to [facility] service on 10/21/2020.  Eventual plan is to transfer him to CIR.  Medically stable for transfer to [place]:   Active Problems:   ICH (intracerebral hemorrhage) (HCC)   Intracranial hemorrhage (HCC)   Atrial fibrillation (HCC)   Prediabetes   Leukocytosis   Acute blood loss anemia   Hypernatremia   Acute hypoxic respiratory failure: Was intubated in the ED for airway protection.  Failure to wean so status post tracheostomy on 4/6.  Off vent since 4/20.  Currently on trach collar on 5 L.  Respiratory status is stable.  Intermittent chest x-ray.  PCCM following.  Continue Passy-Muir valve as tolerated.  Speech therapy following.  Intracerebral hemorrhage: Presented with right-sided weakness, slurred speech.  Code stroke was called on presentation.  CT head showed acute intraparenchymal hemorrhage in the dorsal left pons with subarachnoid extension.  CT head and neck did not show any vascular malformation, and evaluate aneurysm, no LVO. MRI done on 4/8 showed 2-3 punctate infarcts in the left frontal and parietal lobes corresponding to small acute/subacute infarcts.  Echocardiogram showed ejection fraction of 55 to 60%, no wall motion abnormality.  LDL of 88.   Hemoglobin A1c 5.7.  Not on statin due to elevated LFT, we might consider adding statin on discharge. PT/OT recommended CIR on discharge.  Obstructive hydrocephalus: Intraventricular drain placed on 3/30.  EVD stopped working on 4/2, status post alteplase placement resulting in new hemorrhage along the EVD tract.  Repeat imaging showed improvement  Hospital-acquired pneumonia/ventilator associated pneumonia: Sputum culture on 3/30 showed Moraxella. One of the blood cultures also showed Acenatobacter on 4/8,BCID negative.  ID was following.  Treated with Unasyn, completed antibiotics course.  He had intermittent fever while being on antibiotics, could be from DVT/PE. Afebrile today. Leukocytosis resolved.  Currently respiratory status is stable.  Sputum culture sent on 10/16/2020 showed normal flora.  Acute PE/bilateral lower leg DVT: Venous Doppler done on 4/10 showed acute DVT on right lower extremity, age-indeterminate DVT on left.  Status post IVC filter on 4/10.  CT angio on 4/20 showed bilateral PE.  He was on heparin drip,  changed to Eliquis.  Severe encephalopathy: Associated with intracerebral hemorrhage.  Had intermittent left upper extremity jerking.  Focal seizures could not be ruled out as per LTM EEG, it showed diffuse encephalopathy.On amantidine.  Angioedema: Much improved.  Concerning for Keppra Allergy.  Hypertension: Currently blood pressure stable.  Continue current regimen  Paroxysmal A. fib: Status post ablation/cardioversion in the past.  Was not taking anticoagulation when he was admitted.  Continue current anticoagulation, Eliquis.  On Cardizem for rate control  Urinary retention: resolved. On condom cath  Hyperglycemia: Continue current insulin regimen.  Monitor blood sugars.  Hemoglobin A1c of 5.7  Dysphagia: Speech therapy closely following.On feeding tube placement.  Also on dysphagia 1 diet.  Dietitian following.  Also on calorie count.  We may need to think about  putting a PEG if oral intake does not improve.  I discussed this with his wife and she will make a decision. I also requested nutrition and speech therapy consultation  Macrocytic anemia: Currently hemoglobin stable.  Continue monitoring  Lower extremity edema: Improved with  2 doses of IV Lasix 40 mg on 10/22/20  Mild hypernatremia: Continue free water.  Monitor BMP  Macrocytic anemia: Hemoglobin ranging from 8-9.  Continue to monitor.  Most likely associated with acute illness, poor oral intake. Normal vitamin A91 and folic acid  Pressure ulcers: Not present on admission.  Wound care following.  Pressure Injury 10/21/20 Head Left;Posterior Stage 1 -  Intact skin with non-blanchable redness of a localized area usually over a bony prominence. Pink non-blanchable scalp with thinning hair on posterior left side of head (Active)  10/21/20 1100  Location: Head  Location Orientation: Left;Posterior  Staging: Stage 1 -  Intact skin with non-blanchable redness of a localized area usually over a bony prominence.  Wound Description (Comments): Pink non-blanchable scalp with thinning hair on posterior left side of head  Present on Admission: No          Nutrition Problem: Inadequate oral intake Etiology: inability to eat      DVT prophylaxis:Eliquis Code Status: Full Family Communication: Discussed with wife on phone on 4/24/negative Status is: Inpatient  Remains inpatient appropriate because:Inpatient level of care appropriate due to severity of illness   Dispo: The patient is from: Home              Anticipated d/c is to: CIR              Patient currently is medically stable for discharge   Difficult to place patient No    Consultants: PCCM, neurology  Procedures: As above  Antimicrobials:  Anti-infectives (From admission, onward)   Start     Dose/Rate Route Frequency Ordered Stop   10/18/20 1100  Ampicillin-Sulbactam (UNASYN) 3 g in sodium chloride 0.9 % 100 mL IVPB         3 g 200 mL/hr over 30 Minutes Intravenous Every 6 hours 10/18/20 0931 10/21/20 0019   10/16/20 1100  Ampicillin-Sulbactam (UNASYN) 3 g in sodium chloride 0.9 % 100 mL IVPB  Status:  Discontinued        3 g 200 mL/hr over 30 Minutes Intravenous Every 6 hours 10/16/20 0948 10/18/20 0859   10/07/20 0000  vancomycin (VANCOREADY) IVPB 1500 mg/300 mL  Status:  Discontinued        1,500 mg 150 mL/hr over 120 Minutes Intravenous Every 12 hours 10/06/20 1000 10/07/20 1406   10/06/20 1200  ceFEPIme (MAXIPIME) 2 g in sodium chloride 0.9 % 100 mL IVPB        2 g 200 mL/hr over 30 Minutes Intravenous Every 8 hours 10/06/20 1000 10/13/20 2225   10/06/20 1100  vancomycin (VANCOREADY) IVPB 2000 mg/400 mL        2,000 mg 200 mL/hr over 120 Minutes Intravenous  Once 10/06/20 1000 10/06/20 1314   10/02/20 0900  Ampicillin-Sulbactam (UNASYN) 3 g in sodium chloride 0.9 % 100 mL IVPB  Status:  Discontinued        3 g 200 mL/hr over 30 Minutes Intravenous Every 6 hours 10/02/20 0812 10/06/20 0954   09/29/20 1130  cefTRIAXone (ROCEPHIN) 2 g in sodium chloride 0.9 % 100 mL IVPB  Status:  Discontinued  2 g 200 mL/hr over 30 Minutes Intravenous Every 24 hours 09/29/20 1037 10/02/20 9292      Subjective:  Patient seen and examined the bedside this morning.  Wife at the bedside.  Looks more alert, awake, communicative, obeys commands.  Looks very comfortable  Objective: Vitals:   10/22/20 2334 10/22/20 2338 10/23/20 0340 10/23/20 0400  BP: 121/65  130/65   Pulse: 75  67   Resp: (!) 26 20 (!) 21   Temp: 98 F (36.7 C)  97.9 F (36.6 C)   TempSrc: Axillary  Axillary   SpO2: 98%  98%   Weight:    102.6 kg  Height:        Intake/Output Summary (Last 24 hours) at 10/23/2020 0732 Last data filed at 10/23/2020 0358 Gross per 24 hour  Intake 120 ml  Output 1500 ml  Net -1380 ml   Filed Weights   10/21/20 0400 10/22/20 0500 10/23/20 0400  Weight: 98.7 kg 102.7 kg 102.6 kg     Examination:   General exam: Overall comfortable, not in distress, deconditioned comfortable. HEENT: Feeding tube, trach Respiratory system:  no wheezes or crackles  Cardiovascular system: S1 & S2 heard, RRR.  Gastrointestinal system: Abdomen is nondistended, soft and nontender. Central nervous system: Alert and awake, obeys commands, hemiplegic on the right side Extremities: Trace bilateral lower extremity edema, no clubbing ,no cyanosis Skin: Pressure ulcers on the buttock, rectal tube    Data Reviewed: I have personally reviewed following labs and imaging studies  CBC: Recent Labs  Lab 10/18/20 0734 10/19/20 0038 10/20/20 0427 10/21/20 0032 10/22/20 0326  WBC 11.1* 13.0* 10.7* 8.9 8.2  NEUTROABS  --   --   --   --  5.3  HGB 10.2* 9.7* 9.9* 9.2* 8.9*  HCT 32.4* 31.0* 32.4* 30.8* 28.9*  MCV 100.6* 100.0 102.9* 103.0* 102.8*  PLT 128* 140* 156 176 446   Basic Metabolic Panel: Recent Labs  Lab 10/16/20 0815 10/17/20 0642 10/18/20 0734 10/19/20 0038 10/19/20 2242 10/20/20 0427 10/21/20 0032 10/22/20 0326  NA 137 140 138 143 145 147* 146* 146*  K 4.2 4.2 3.9 3.6 4.1 3.8 3.7 4.1  CL 101 103 101 105 107 109 110 112*  CO2 '28 27 26 28 29 28 28 26  ' GLUCOSE 186* 216* 243* 166* 120* 124* 131* 136*  BUN 28* 42* 59* 58* 46* 37* 31* 24*  CREATININE 0.95 1.14 1.38* 1.25* 1.04 0.97 0.91 0.78  CALCIUM 8.6* 8.9 8.5* 8.6* 8.5* 8.7* 8.6* 8.7*  MG 2.5* 2.7* 3.0*  --  2.9*  --   --   --    GFR: Estimated Creatinine Clearance: 117.7 mL/min (by C-G formula based on SCr of 0.78 mg/dL). Liver Function Tests: Recent Labs  Lab 10/17/20 0642 10/18/20 0734 10/22/20 0326  AST 56* 61* 37  ALT 126* 136* 79*  ALKPHOS 129* 129* 116  BILITOT 0.8 1.0 0.8  PROT 6.6 6.3* 5.6*  ALBUMIN 2.8* 2.8* 2.5*   No results for input(s): LIPASE, AMYLASE in the last 168 hours. No results for input(s): AMMONIA in the last 168 hours. Coagulation Profile: No results for input(s): INR, PROTIME  in the last 168 hours. Cardiac Enzymes: Recent Labs  Lab 10/18/20 0734  CKTOTAL 97   BNP (last 3 results) No results for input(s): PROBNP in the last 8760 hours. HbA1C: No results for input(s): HGBA1C in the last 72 hours. CBG: Recent Labs  Lab 10/22/20 1222 10/22/20 1656 10/22/20 1938 10/22/20 2329 10/23/20 0350  GLUCAP  102* 106* 152* 94 133*   Lipid Profile: No results for input(s): CHOL, HDL, LDLCALC, TRIG, CHOLHDL, LDLDIRECT in the last 72 hours. Thyroid Function Tests: No results for input(s): TSH, T4TOTAL, FREET4, T3FREE, THYROIDAB in the last 72 hours. Anemia Panel: Recent Labs    10/22/20 1020  VITAMINB12 495  FOLATE 9.7   Sepsis Labs: No results for input(s): PROCALCITON, LATICACIDVEN in the last 168 hours.  Recent Results (from the past 240 hour(s))  Culture, Respiratory w Gram Stain     Status: None   Collection Time: 10/16/20 10:56 AM   Specimen: Tracheal Aspirate; Respiratory  Result Value Ref Range Status   Specimen Description TRACHEAL ASPIRATE  Final   Special Requests NONE  Final   Gram Stain   Final    RARE SQUAMOUS EPITHELIAL CELLS PRESENT MODERATE WBC PRESENT, PREDOMINANTLY PMN FEW YEAST FEW GRAM POSITIVE COCCI    Culture   Final    MODERATE Normal respiratory flora-no Staph aureus or Pseudomonas seen Performed at [facility], [place] [address], [place] [address], [place] [address]    Report Status 10/18/2020 FINAL  Final         Radiology Studies: No results found.      Scheduled Meds: . amantadine  200 mg Per Tube BID  . apixaban  5 mg Per Tube BID  . chlorhexidine gluconate (MEDLINE KIT)  15 mL Mouth Rinse BID  . Chlorhexidine Gluconate Cloth  6 each Topical Daily  . diltiazem  30 mg Per Tube Q6H  . doxazosin  2 mg Per Tube Daily  . feeding supplement  237 mL Oral BID BM  . feeding supplement (PROSource TF)  45 mL Per Tube TID  . feeding supplement (VITAL 1.5 CAL)  960 mL Per Tube Q24H  . fiber  1 packet Oral BID  . free  water  200 mL Per Tube Q4H  . insulin aspart  0-20 Units Subcutaneous Q4H  . insulin glargine  6 Units Subcutaneous Daily  . mouth rinse  15 mL Mouth Rinse 10 times per day  . ondansetron (ZOFRAN) IV  4 mg Intravenous Q6H  . sodium chloride flush  10-40 mL Intracatheter Q12H   Continuous Infusions: . sodium chloride Stopped (10/19/20 1506)     LOS: 28 days    Time spent: 55 mins,More than 50% of that time was spent in counseling and/or coordination of care.      Shelly Coss, MD [facility] P4/25/2022, 7:32 AM

## 2020-10-23 NOTE — Progress Notes (Signed)
SLP Cancellation Note  Patient Details Name: Walter [name] MRN: [id number] DOB: [birth year]   Cancelled treatment:   Attempted to see pt this am, but he was sleeping soundly after a busy morning. Will continue efforts next date.  Asencion Guisinger L. Samson Frederic, MA CCC/SLP Acute Rehabilitation Services Office number [phone] Pager [phone]         Carolan Shiver 10/23/2020, 4:44 PM

## 2020-10-23 NOTE — Progress Notes (Signed)
NAME:  FREELAND PRACHT, MRN:  [id number], DOB:  May 29, 1956, LOS: 28 ADMISSION DATE:  09/25/2020, CONSULTATION DATE:  09/25/20 REFERRING MD:  Wilford Corner,  CHIEF COMPLAINT:  Headache, r side weakness, slurred speech  History of Present Illness:  Mr. Micciche is a 65 yo man with hx of afib not on anticoagulation admitted with right-sided weakness, hemorrhagic stroke. Emergently intubated in the ED for airway protection  Pertinent  Medical History  A fib/flutter s/p ablation and cardioversion, Retinal detachment, COVID 19 infection July 2021  Significant Hospital Events: Including procedures, antibiotic start and stop dates in addition to other pertinent events   . 3/28- Admit, intubated.  Not a candidate for TPA due to intracranial hemorrhage.  Started 3% saline . 3/29- EVD placement for developing hydrocephalus. CTA of the head was unremarkable . 3/30- Extubated. . 4/1 respiratory culture grew Moraxella, on ceftriaxone . 4/4 reintubated because of hypoxia and unresponsiveness . 4/6 tracheostomy . 4/7 still encephalopathic  . 4/8 remains encephalopathic . 4/10 Doppler shows bilateral DVT. IVC filter placed.  . 4/11 Blood cultures positive for Acinetobacter  . 4/12 Fever no worse off [place], off sedative infusions, tolerating trach collar.  . 4/13 Febrile @ 7am . 4/14 - 4/17 4/16 Afebrile Intermittently following simple commands and participated in PT, drowsy today . 4/18 febrile. Starting unasyn, sending trach asp . 4/19 T Max 101.7, WBC 12.2, ALT elevated, profoundly deconditioned . 4/20 awake and following commands. Trach aspirate w few GPC.  . 4/21 Tolerated trach collar >24h . 4/22 D1 diet, transfer out of ICU  Interim History / Subjective:  Doing well on trach collar. A little sleepy this morning. About to work with PT  Objective   Blood pressure 129/68, pulse 70, temperature 98.5 F (36.9 C), temperature source Oral, resp. rate 19, height 6\' 2"  (1.88 m), weight 102.6 kg, SpO2 97  %.    FiO2 (%):  [28 %] 28 %   Intake/Output Summary (Last 24 hours) at 10/23/2020 1106 Last data filed at 10/23/2020 0358 Gross per 24 hour  Intake 120 ml  Output 1500 ml  Net -1380 ml   Filed Weights   10/21/20 0400 10/22/20 0500 10/23/20 0400  Weight: 98.7 kg 102.7 kg 102.6 kg    Physical Exam:  General - sleepy ENT - trach site clean, few secretions with coughing  Cardiac - regular rate/rhythm, no murmur Chest - equal breath sounds b/l, no wheezing or rales Skin - no rashes Neuro - sleeping, but arouses to tactile stimuli and moves all 4 extremities  Labs/imaging that I havepersonally reviewed  (right click and "Reselect all SmartList Selections" daily)  Na 153(worsening), Cr 0.82, WBC 8.4, Hgb 9.2   CMP Latest Ref Rng & Units 10/23/2020 10/22/2020 10/21/2020  Glucose 70 - 99 mg/dL 10/23/2020) 287(O) 676(H)  BUN 8 - 23 mg/dL 22 209(O) 70(J)  Creatinine 0.61 - 1.24 mg/dL 62(E 3.66 2.94  Sodium 135 - 145 mmol/L 140 146(H) 146(H)  Potassium 3.5 - 5.1 mmol/L 4.4 4.1 3.7  Chloride 98 - 111 mmol/L 107 112(H) 110  CO2 22 - 32 mmol/L 28 26 28   Calcium 8.9 - 10.3 mg/dL 7.65) ) 4.6(T)  Total Protein 6.5 - 8.1 g/dL - 5.6(L) -  Total Bilirubin 0.3 - 1.2 mg/dL - 0.8 -  Alkaline Phos 38 - 126 U/L - 116 -  AST 15 - 41 U/L - 37 -  ALT 0 - 44 U/L - 79(H) -    CBC Latest Ref Rng & Units  10/23/2020 10/22/2020 10/21/2020  WBC 4.0 - 10.5 K/uL 8.4 8.2 8.9  Hemoglobin 13.0 - 17.0 g/dL 1.2(X) 8.9(L) 9.2(L)  Hematocrit 39.0 - 52.0 % 29.6(L) 28.9(L) 30.8(L)  Platelets 150 - 400 K/uL 175 150 176    CBG (last 3)  Recent Labs    10/22/20 2329 10/23/20 0350 10/23/20 0908  GLUCAP 94 133* 133*    Resolved Hospital Problem list   Acute obstructive hydrocephalus s/p EVD, Hypernatremia, Moraxella catarrhalis pneumonia s/p antibiotics, Thrombocytopenia, Acute midbrain/dorsal pons/thamalus intraparenchymal hemorrhage  Assessment & Plan:   Acute hypoxic respiratory failure with compromised  airway. Failure to wean s/p tracheostomy 4/06. - off vent since AM of 4/20 - goal SpO2 > 92% - f/u CXR as needed - trach care - continue PM valve as tolerated - f/u with speech therapy  Acute PE and bilateral lower leg DVT. - doppler legs 4/10 >> acute DVT Rt femoral, proximal profunda, popliteal, tibial, peroneal, and gastrocnemius veins; age indeterminate DVT Lt popliteal, posterior tibial, and peroneal veins - s/p IVC filter 4/10 - CT chest 4/19 >> bilateral PE Rt > Lt - continue eliquis   Awaiting CIR. Appropriate for transfer from pulmonary perspective. Will see periodically for trach care. Please call as needed.   Durel Salts, MD Pulmonary and Critical Care Medicine [facility]

## 2020-10-23 NOTE — Progress Notes (Signed)
STROKE TEAM PROGRESS NOTE   INTERVAL HISTORY Patient's wife is at the bedside.  He sitting up in bed.  He is able to speak with his tracheostomy capped.  He has good strength on the left side.  He is able to move right upper and lower extremity but does have mild weakness.  Vital signs are stable. He has been eating a bit with his family and is undergoing calorie counts.  He still has a feeding tube for tube feedings at night Vitals:   10/23/20 0400 10/23/20 0814 10/23/20 0836 10/23/20 0957  BP:  124/71 129/68   Pulse:  71 70   Resp:  20 (!) 26 19  Temp:  98.5 F (36.9 C)    TempSrc:  Oral    SpO2:  98% 97%   Weight: 102.6 kg     Height:       CBC:  Recent Labs  Lab 10/22/20 0326 10/23/20 0751  WBC 8.2 8.4  NEUTROABS 5.3 5.8  HGB 8.9* 9.2*  HCT 28.9* 29.6*  MCV 102.8* 102.4*  PLT 150 175   Basic Metabolic Panel:  Recent Labs  Lab 10/18/20 0734 10/19/20 0038 10/19/20 2242 10/20/20 0427 10/22/20 0326 10/23/20 0751  NA 138   < > 145   < > 146* 140  K 3.9   < > 4.1   < > 4.1 4.4  CL 101   < > 107   < > 112* 107  CO2 26   < > 29   < > 26 28  GLUCOSE 243*   < > 120*   < > 136* 153*  BUN 59*   < > 46*   < > 24* 22  CREATININE 1.38*   < > 1.04   < > 0.78 0.82  CALCIUM 8.5*   < > 8.5*   < > 8.7* 8.7*  MG 3.0*  --  2.9*  --   --   --    < > = values in this interval not displayed.    IMAGING  CT head 4/6 1. Ventricle size shows mild interval enlargement since yesterday. No significant hydrocephalus. Intraventricular hemorrhage stable 2. Right frontal ventricular drain unchanged. Hemorrhage surrounding the drain unchanged. 3. Subarachnoid hemorrhage unchanged. Hemorrhage in the left tectum unchanged. No new hemorrhage identified.  CT head 4/4 1. Interval decrease in intraventricular hemorrhage with decreased size of the lateral ventricles as compared to prior, right greater than left. Right parietal approach ventriculostomy in stable position with tip near the  septum pellucidum. 2. Stable small volume subarachnoid and intraparenchymal hemorrhage along the catheter tract. 3. Stable 1.9 cm hemorrhage at the left dorsal pons. 4. No other new acute intracranial abnormality. 09/29/2020  CT head 4/3 IMPRESSION: 1. New intraventricular hemorrhage in the third and lateral ventricles. Fourth ventricular and midbrain hemorrhage is unchanged. 2. New right frontal EVD which traverses the right lateral ventricle. Blood clot encompasses the lower catheter and ventriculomegaly is similar to prior. 3. Small cortical infarcts are newly seen along the superior left frontal convexity.  09/26/2020 IR Angio IMPRESSION: 1. No evidence of an AVM, dural arteriovenous fistula, aneurysm or other vascular abnormality to explain patient's brainstem hemorrhage.  09/26/2020 CT head:  IMPRESSION: 1. Unchanged left brainstem hemorrhage extending into the fourth ventricle. 2. Worsening hydrocephalus.  09/25/2020 MRI brain w/wo, MR Venogram IMPRESSION: 1. Acute hemorrhage centered within the left dorsal midbrain extending into the dorsal pons and inferomedial thalamus with intraventricular extension. No abnormal enhancement to suggest underlying lesion.  2. Compression of the cerebral aqueduct with possible mild obstructive hydrocephalus. 3. No evidence of dural sinus thrombosis. Attenuation of the left basal vein in the quadrigeminal cistern without definite occlusion.  09/25/2020 CT Angio head/neck IMPRESSION: 1. No vascular malformation, anomaly or aneurysm. 2. No emergent large vessel occlusion or high-grade stenosis of the intracranial arteries. 3. Unchanged size of dorsal left pontine hemorrhage with intraventricular extension.  09/25/2020 CT head code stroke IMPRESSION: Acute intraparenchymal hemorrhage in the dorsal left pons with subarachnoid extension into the cerebral aqueduct and fourth Ventricle.  EEG-LTM 4/5-4/6  This study showed periodic  discharges with triphasic morphology at 1.5 to 2.5 Hz which is on the ictal-interictal continuum with low to intermediate potential for seizures.  Additionally there is evidence of moderate to severe diffuse encephalopathy, nonspecific etiology.  No seizures were seen during the study  EEG adult 10/13/2020 This study is suggestive of moderate diffuse encephalopathy, nonspecific etiology. No seizures or definite epileptiform discharges were seen throughout the recording. [name]   CT HEAD WO CONTRAST  Result Date: 10/17/2020 CLINICAL DATA:  Follow-up intracranial hemorrhage EXAM: CT HEAD WITHOUT CONTRAST TECHNIQUE: Contiguous axial images were obtained from the base of the skull through the vertex without intravenous contrast. COMPARISON:  10/11/2020 FINDINGS: Brain: Intraparenchymal hemorrhage in the left side the upper pons and midbrain no longer shows any hyperdense components. There is an intra-axial cystic space measuring up to 15 mm in diameter, which may communicate with the fourth ventricle. No focal cerebellar abnormality is seen. Continuing diminishing density of blood along the ventriculostomy track in the right frontal region. Persistent brain edema surrounding that. Small amount of subarachnoid blood possibly subdural blood the ventriculostomy entry site appears similar. No evidence of any new or increased bleeding. Small cortical infarctions of the left frontoparietal vertex are barely appreciable. No evidence of new brain infarction. Ventricular size remains stable without evidence of ongoing aqueductal obstruction. Previously seen hyperdense blood dependent within the occipital horns is diminishing. Vascular: No vascular finding. Skull: Normal other than the right frontal burr hole. Sinuses/Orbits: Paranasal sinuses are clear. Orbits are normal. No fluid in the middle ears. Small mastoid effusions. Other: None IMPRESSION: 1. Continuing diminishing density of blood along the  ventriculostomy track in the right frontal region. No evidence of any new or increased bleeding. Diminishing dependent intraventricular blood in the lateral ventricles. Very small amount of subarachnoid blood and possibly subdural blood at the ventriculostomy entry site appears similar. 2. Small cortical infarctions of the left frontoparietal vertex are barely appreciable. No evidence of new or increased infarction. 3. Ventricular size remains stable without evidence of ongoing aqueductal obstruction. 4. 15 mm intra-axial cystic space in the left side of the upper pons and midbrain at the site of the previous hematoma. This may communicate with the fourth ventricle. Electronically Signed   By: Paulina Fusi M.D.   On: 10/17/2020 07:35   CT HEAD WO CONTRAST  Result Date: 10/17/2020 CLINICAL DATA:  Follow-up intracranial hemorrhage EXAM: CT HEAD WITHOUT CONTRAST TECHNIQUE: Contiguous axial images were obtained from the base of the skull through the vertex without intravenous contrast. COMPARISON:  10/11/2020 FINDINGS: Brain: Intraparenchymal hemorrhage in the left side the upper pons and midbrain no longer shows any hyperdense components. There is an intra-axial cystic space measuring up to 15 mm in diameter, which may communicate with the fourth ventricle. No focal cerebellar abnormality is seen. Continuing diminishing density of blood along the ventriculostomy track in the right frontal region. Persistent brain edema surrounding  that. Small amount of subarachnoid blood possibly subdural blood the ventriculostomy entry site appears similar. No evidence of any new or increased bleeding. Small cortical infarctions of the left frontoparietal vertex are barely appreciable. No evidence of new brain infarction. Ventricular size remains stable without evidence of ongoing aqueductal obstruction. Previously seen hyperdense blood dependent within the occipital horns is diminishing. Vascular: No vascular finding. Skull:  Normal other than the right frontal burr hole. Sinuses/Orbits: Paranasal sinuses are clear. Orbits are normal. No fluid in the middle ears. Small mastoid effusions. Other: None IMPRESSION: 1. Continuing diminishing density of blood along the ventriculostomy track in the right frontal region. No evidence of any new or increased bleeding. Diminishing dependent intraventricular blood in the lateral ventricles. Very small amount of subarachnoid blood and possibly subdural blood at the ventriculostomy entry site appears similar. 2. Small cortical infarctions of the left frontoparietal vertex are barely appreciable. No evidence of new or increased infarction. 3. Ventricular size remains stable without evidence of ongoing aqueductal obstruction. 4. 15 mm intra-axial cystic space in the left side of the upper pons and midbrain at the site of the previous hematoma. This may communicate with the fourth ventricle. Electronically Signed   By: Paulina Fusi M.D.   On: 10/17/2020 07:35   CT CHEST W CONTRAST  Result Date: 10/17/2020 CLINICAL DATA:  Fever of unknown origin. EXAM: CT CHEST, ABDOMEN, AND PELVIS WITH CONTRAST TECHNIQUE: Multidetector CT imaging of the chest, abdomen and pelvis was performed following the standard protocol during bolus administration of intravenous contrast. CONTRAST:  OMNIPAQUE IOHEXOL 300 MG/ML  SOLN COMPARISON:  Plain film 10/17/2020.  No prior CTs. FINDINGS: CT CHEST FINDINGS Cardiovascular: Aortic atherosclerosis. Normal heart size, without pericardial effusion. Bilateral pulmonary emboli are identified on this non dedicated exam. Example in the central right pulmonary artery on 28/3. Branch pulmonary arteries to both upper and lower lobes on 32/3. Within the left upper lobe pulmonary artery branch on 20/3. Mediastinum/Nodes: No mediastinal or hilar adenopathy. Lungs/Pleura: No pleural fluid. Endotracheal tube terminates appropriately. Areas of mild peripheral predominant airspace and  ground-glass are indeterminate. Example at the left apex on 22/5 and right lower lobe on 58/5. More dependent bibasilar airspace opacities are likely due to atelectasis. Musculoskeletal: No acute osseous abnormality. CT ABDOMEN PELVIS FINDINGS Hepatobiliary: Mild hepatomegaly at 18.4 cm craniocaudal. Normal gallbladder, without biliary ductal dilatation. Pancreas: Normal, without mass or ductal dilatation. Spleen: Normal in size, without focal abnormality. Adrenals/Urinary Tract: Normal adrenal glands. Bilateral renal sinus cysts, without hydronephrosis. Foley catheter within the urinary bladder. Stomach/Bowel: Nasogastric tube terminating at the gastric antrum. Rectal catheter in place. Scattered colonic diverticula. Normal terminal ileum and appendix. Normal small bowel. Vascular/Lymphatic: Aortic atherosclerosis. IVC filter is appropriately positioned. No abdominopelvic adenopathy. Reproductive: Mild prostatomegaly. Other: No significant free fluid. No free intraperitoneal air. Mild pelvic anasarca. tiny bilateral fat containing inguinal hernias. Musculoskeletal: Degenerative partial fusion of bilateral sacroiliac joints in. Degenerate disc disease at the lumbosacral junction. IMPRESSION: 1. Bilateral, large volume right greater than left pulmonary emboli on this nondedicated exam. No evidence of right heart strain. 2. Mild peripheral ground-glass opacities, favoring atelectasis or infarct. Atypical infection felt less likely. 3. Otherwise, no explanation for fever. 4.  Aortic Atherosclerosis (ICD10-I70.0). 5. Prostatomegaly. These results will be called to the ordering clinician or representative by the Radiologist Assistant, and communication documented in the PACS or [employer]. Electronically Signed   By: Jeronimo Greaves M.D.   On: 10/17/2020 14:43   CT ABDOMEN PELVIS W CONTRAST  Result Date: 10/17/2020 CLINICAL DATA:  Fever of unknown origin. EXAM: CT CHEST, ABDOMEN, AND PELVIS WITH CONTRAST  TECHNIQUE: Multidetector CT imaging of the chest, abdomen and pelvis was performed following the standard protocol during bolus administration of intravenous contrast. CONTRAST:  100mL OMNIPAQUE IOHEXOL 300 MG/ML  SOLN COMPARISON:  Plain film 10/17/2020.  No prior CTs. FINDINGS: CT CHEST FINDINGS Cardiovascular: Aortic atherosclerosis. Normal heart size, without pericardial effusion. Bilateral pulmonary emboli are identified on this non dedicated exam. Example in the central right pulmonary artery on 28/3. Branch pulmonary arteries to both upper and lower lobes on 32/3. Within the left upper lobe pulmonary artery branch on 20/3. Mediastinum/Nodes: No mediastinal or hilar adenopathy. Lungs/Pleura: No pleural fluid. Endotracheal tube terminates appropriately. Areas of mild peripheral predominant airspace and ground-glass are indeterminate. Example at the left apex on 22/5 and right lower lobe on 58/5. More dependent bibasilar airspace opacities are likely due to atelectasis. Musculoskeletal: No acute osseous abnormality. CT ABDOMEN PELVIS FINDINGS Hepatobiliary: Mild hepatomegaly at 18.4 cm craniocaudal. Normal gallbladder, without biliary ductal dilatation. Pancreas: Normal, without mass or ductal dilatation. Spleen: Normal in size, without focal abnormality. Adrenals/Urinary Tract: Normal adrenal glands. Bilateral renal sinus cysts, without hydronephrosis. Foley catheter within the urinary bladder. Stomach/Bowel: Nasogastric tube terminating at the gastric antrum. Rectal catheter in place. Scattered colonic diverticula. Normal terminal ileum and appendix. Normal small bowel. Vascular/Lymphatic: Aortic atherosclerosis. IVC filter is appropriately positioned. No abdominopelvic adenopathy. Reproductive: Mild prostatomegaly. Other: No significant free fluid. No free intraperitoneal air. Mild pelvic anasarca. tiny bilateral fat containing inguinal hernias. Musculoskeletal: Degenerative partial fusion of bilateral  sacroiliac joints in. Degenerate disc disease at the lumbosacral junction. IMPRESSION: 1. Bilateral, large volume right greater than left pulmonary emboli on this nondedicated exam. No evidence of right heart strain. 2. Mild peripheral ground-glass opacities, favoring atelectasis or infarct. Atypical infection felt less likely. 3. Otherwise, no explanation for fever. 4.  Aortic Atherosclerosis (ICD10-I70.0). 5. Prostatomegaly. These results will be called to the ordering clinician or representative by the Radiologist Assistant, and communication documented in the PACS or [employer]. Electronically Signed   By: Jeronimo GreavesKyle  Talbot M.D.   On: 10/17/2020 14:43     PHYSICAL EXAM  Temp:  [97.8 F (36.6 C)-98.5 F (36.9 C)] 98.5 F (36.9 C) (04/25 0814) Pulse Rate:  [67-77] 70 (04/25 0836) Resp:  [18-26] 19 (04/25 0957) BP: (121-134)/(65-85) 129/68 (04/25 0836) SpO2:  [95 %-98 %] 97 % (04/25 0836) FiO2 (%):  [28 %] 28 % (04/25 0836) Weight:  [102.6 kg] 102.6 kg (04/25 0400)  General - Well nourished, well developed middle-aged Caucasian male, on trach collar  Ophthalmologic - fundi not visualized due to noncooperation.  Cardiovascular - Regular rate and rhythm, not in afib.  Neuro - on trach collar now, awake alert and interactive.  Still has copious secretions; he tries to speak.[name].  He does follow commands consistently both midline and appendicular.  He is able to say a few words at times they are difficult to understand.  Right upper extremity 3/5 and left upper and lower extremity 4 -/5.  Right lower extremity 2/5.   ASSESSMENT/PLAN Mr. Lynann BeaverSteven L [name] is a 65 y.o. male with history of atrial flutter newly diagnosed with atrial fibrillation in February 2022 not on anticoagulation, follows with Dr. Johney FrameAllred in cardiology.  He presented with right-sided weakness, slurred speech, headache, and vomiting.Last known well when he went to bed at 10 PM on 09/24/2020 and woke up at around 2 AM on the day  of admission with slurred speech.  EMS was called.He was very drowsy upon their assessment and notable right side weakeness.  He had worsening mental status, GCS 5, so was emergently intubated in ED for airway protection.  His MRI brain showed acute hemorrhage at left midbrain extending into pons with IVH associated with obstructive hydrocephalus.    ICH - Left Dorsalmidbrain ICHextending into dorsal pons and inferior thalamus with mild mass-effect on third ventricle and hydrocephalus,source unknown, suspect occult cavernoma  Code Stroke CT head-Acute IPH in the dorsal left pons with subarachnoid extension into the cerebral aqueduct and fourth ventricle.  CTA head & neck-No vascular malformation, anomaly or aneurysm. No emergent large vessel occlusion or high-grade stenosis. Unchanged size of dorsal left pontine hemorrhage with intraventricular extension.  MRI& MR Venogram-Acute hemorrhage centered within the left dorsal midbrain extending into the dorsal pons and inferomedial thalamus with intraventricular extension. Compression of the cerebral aqueduct with possible mild obstructive hydrocephalus. No evidence of dural sinus thrombosis.   CT Head W/O Contrast 3/29 0250 -Unchanged left brainstem hemorrhage extending into the fourth ventricle. Worsening hydrocephalus.  Diagnostic Cerebral Angiogram - No evidence of an aneurysm, AVM, dural AV fistula or other vascular abnormality to explain patient's known intraparenchymal hemorrhage.  MRI brain 4/8 -stable IVH, midbrain ICH and right frontal EVD tract hemorrhage.  Ventricular size stable.  2-3 punctate infarcts left frontal and parietal lobes, corresponding to small acute/subacute infarcts.  CT head 4/13 stable IVH, ICH, decreased ventricle size  CT repeat 4/19 continue evolution of ICH and IVH, nearly absorbed now  2D Echo- EF:55-60%. No wall motion abnormalities.  LDL- 88  HgbA1c5.7  VTE prophylaxis - subcu heparin  No  antithromboticprior to admission,  Initially on heparin IV.  Now on  Eliquis.  Therapy recommendations:CIR   Disposition:Pending CIR   Obstructive hydrocephalus   EVD placed on 3/30   CT 4/1 showed clot in right lateral ventricle around catheter.   S/p 1mg  (1cc) alteplase placed in EVD 09/29/20 -> drain well ->ICP measuring 4-11 with a good waveform  CT head (4/3) resolution of most of the IVH and improved hydrocephalus.  EVD stopped working 4/2 overnight. 0.5mg  alteplase placed in EVD 10/01/2020 -> Post alteplase CT head new hemorrhage along the EVD tract an in the right lateral ventricle.  CT repeat 4/4 - decreased IVH and decompressed right lateral ventricle  CT repeat 4/5 -mild interval enlargement of ventricular size, no significant hydrocephalus.  IVH stable.  HCT 4/7 stable, with no appreciable change in overall ventricular size  HCT 4/13 decreased ventricular size  CT repeat 4/19 continue evolution of ICH and IVH, nearly absorbed now  Acute Respiratory Failure  Required intubation on 4/4 due to worsening MS and hypoxia  S/p trach 10/04/20  Off sedation  On trach collar with improving secretions -> speaking valve  On robitussin PRN   Bilateral DVT Bilateral, large volume right greater than left PE  LE venous Doppler 4/10 with bilateral DVTs  Status post IVC filter  CT chest showed bilateral PE, which I believe related to her DVT found on 4/10, not after IVC filter  On Eliquis  Aspiration Pneumonia Community-acquired pneumonia with Moraxella Fever   Tmax 100.6->100.4->101.5->101.7->afebrile->100.1  Sputum culture (3/30): Moraxella  Sputum culture 4/5: neg  WBC 9.8->8.5->8.7->8.9->10.6->12.2->13.0->10.7  CXR 4/7, 4/8 left basilar opacities  CXR 4/19 unremarkable  Blood culture 4/8 positive for Acinetobacter  Off cefepime and vanco -> unasyn (off 4/23)  ID on board - CT abd/pelvis/chest no infection source found  SIRPIDs, resolved Severe  encephalopathy, improving  Left UE intermittent jerking with stimulation  LTM EEG: GPEDs with triphasic wave, generalized SIRPIDs, although focal seizure is in DDx  S/p keppra 10/03/20 -> d/c concerning for angioedema -> on vimpat 10/04/20-> off vimpat 10/11/20  EEG 4/4 moderate to severe diffuse encephalopathy   Repeat spot EEG 4/15 moderate diffuse encephalopathy  Angioedema, resolved  Upper lip edema, much improved  Concerning for keppra allergy  Hypertension  Home meds:Diltiazem  Currently on Diltiazem 30 mg q6  off clonidine  BP goal Systolic < 160  Long-term BP goal normotensive  Hx of Aflutter PAF  Not on [facility] before admission  S/p CTI ablation for A flutter  off ASA  Not AC candidate at this time.  Afib with RVR intermittent -> sinus now  On heparin IV, will switch to Eliquis  Hyperlipidemia  Home meds:none  LDL88, goal < 70  AST/ALT 56/126->61/136  No statin now due to elevated LFTs  May consider to addstatin on discharge if LFT normalized  Dysphagia  Secondary to hemorrhagic stroke  Passed swallow  On Dys 1 and thin liquid  SLP on board  Tube feeds @ 22ml/hr for nocturnal feeding  Encourage po intake  Calorie count ordered  Other Stroke Risk Factors  Advanced Age >/= 65  Snoring: sleep study denied by insurance prior to admission   Other Active Problems:  Hypokalemia  AKI Cre 0.95->1.14->1.38->1.25, on TF  Lethargic - on amantadine   Wound care consulted for buttock wound due to diarrhea  Hospital day #28 I have personally obtained history,examined this patient, reviewed notes, independently viewed imaging studies, participated in medical decision making and plan of care.ROS completed by me personally and pertinent positives fully documented  I have made any additions or clarifications directly to the above note. Agree with note above.  Continue Eliquis for DVT and pulmonary embolism.  Continue ongoing therapies.   Patient encouraged to increase p.o. intake so that feeding tube can be removed hopefully soon.  Transfer to inpatient rehab in the next few days when bed available.  Discussed with patient wife and friend and answered questions.  Greater than 50% time during this 25-minute visit was spent on counseling and coordination of care about his brainstem hemorrhage, dysphagia rehabilitation answering questions.  Delia Heady, MD Medical Director [facility] Pager: [phone] 10/23/2020 4:51 PM        10/23/2020 10:51 AM

## 2020-10-23 NOTE — Progress Notes (Signed)
Physical Therapy Treatment Patient Details Name: Walter [name] MRN: [id number] DOB: [birth year] Today's Date: 10/23/2020    History of Present Illness 65 yo male presenting 3/28 with R-sided weakness and slurred speech. Imaging revealed acute hemorrhage centered within the left dorsal midbrain extending into the dorsal pons and inferomedial thalamus with intraventricular extension. EVD placement for developing hydrocephalus on 3/29. Extubated 3/30. Pt with noted decrease in EVD drainage overnight 3/31, proximal and distal clots noted, flushed, repeat CT shows significant clot in R lateral ventricle. EVD with another episode of clotting overnight 4/2, intraventricular TpA administered. Reintubated 4/4. 4/6 tracheostomy. 4/10 Doppler shows bilateral DVT. IVC filter placed. CT at chest showing bilateral (R>L) PEs on 4/19. PMH Afib, COVID infection (July 2021), and retinal detachment.    PT Comments    The pt was initially lethargic, but able to demo good improvement in alertness and eagerness for therapy with mobility this morning. The pt was able to follow ~75% of simple, direct commands to L extremities, and ~50% of commands with R extremities. The pt benefits from increased processing time, but is able to initiate most movements, needing min-modA to complete due to weakness. The pt was able to tolerate static sitting EOB with minA to maxA for 15 min, VSS. The pt required totalA to complete lateral scooting along EOB at this time with assist of 2 to complete hip clearance with use of bed pad to complete lateral movement. The pt was unable to assist with power up at this time, will benefit from skilled PT to further progress LE strength, power, and dynamic stability to allow for improved OOB mobility.      Follow Up Recommendations  CIR     Equipment Recommendations  Hospital bed (lift)    Recommendations for Other Services       Precautions / Restrictions Precautions Precautions:  Fall Precaution Comments: flexiseal Required Braces or Orthoses: Other Brace Other Brace: BUE resting hand splints, see schedule Restrictions Weight Bearing Restrictions: No    Mobility  Bed Mobility Overal bed mobility: Needs Assistance Bed Mobility: Supine to Sit;Rolling;Sit to Sidelying Rolling: +2 for physical assistance;Mod assist   Supine to sit: Max assist;+2 for physical assistance   Sit to sidelying: Max assist;+2 for physical assistance General bed mobility comments: pt needing assist to BLE, able to initiate reaching but needing assist to complete movement due to weakness and increased processing speed    Transfers Overall transfer level: Needs assistance Equipment used: 2 person hand held assist Transfers: Squat Pivot Transfers;Lateral/Scoot Transfers     Squat pivot transfers: Total assist;+2 physical assistance    Lateral/Scoot Transfers: Total assist;+2 physical assistance General transfer comment: pt attempting to push to stand with LUE, required totalA with blocking of knees to initiate squat and hip clearance from bed to scoot along EOB x3   Modified Rankin (Stroke Patients Only) Modified Rankin (Stroke Patients Only) Pre-Morbid Rankin Score: No symptoms Modified Rankin: Severe disability     Balance Overall balance assessment: Needs assistance Sitting-balance support: Feet supported;Bilateral upper extremity supported Sitting balance-Leahy Scale: Poor Sitting balance - Comments: minG to modA at times, needs cues or assist due to poor reactions and ability to self-correct Postural control: Posterior lean;Left lateral lean Standing balance support: Bilateral upper extremity supported Standing balance-Leahy Scale: Zero Standing balance comment: reliant on BUE support and totalA of 2  Cognition Arousal/Alertness: Awake/alert;Lethargic (initially lethargic, more alert with stimuli) Behavior During Therapy: Flat  affect Overall Cognitive Status: Impaired/Different from baseline Area of Impairment: Orientation;Attention;Following commands;Safety/judgement                 Orientation Level: Disoriented to;Place;Time;Situation Current Attention Level: Sustained (for short periods)   Following Commands: Follows one step commands with increased time;Follows one step commands consistently Safety/Judgement: Decreased awareness of safety;Decreased awareness of deficits     General Comments: pt initially lethargic, but with improved alertness and command following through session. Pt ~75% command following with L extremities and simple, direct commands. attempting to follow ~50% of commands with RUE. increased time, and at times multimodal cues given      Exercises General Exercises - Upper Extremity Shoulder Flexion: PROM;Both;10 reps;Seated Shoulder Extension: PROM;Both;10 reps;Seated General Exercises - Lower Extremity Ankle Circles/Pumps: PROM;Supine;AROM;Seated;15 reps Long Arc Quad: AAROM;Both;5 reps;Seated Heel Slides: PROM;Both;10 reps;Supine Hip ABduction/ADduction: PROM;Both;10 reps;Supine    General Comments General comments (skin integrity, edema, etc.): VSS on 5L 28% trach collar      Pertinent Vitals/Pain Pain Assessment: Faces Faces Pain Scale: No hurt Pain Intervention(s): Monitored during session           PT Goals (current goals can now be found in the care plan section) Acute Rehab PT Goals Patient Stated Goal: none stated today PT Goal Formulation: With patient Time For Goal Achievement: 10/30/20 Potential to Achieve Goals: Fair Progress towards PT goals: Progressing toward goals    Frequency    Min 4X/week      PT Plan Current plan remains appropriate    Co-evaluation PT/OT/SLP Co-Evaluation/Treatment: Yes Reason for Co-Treatment: Complexity of the patient's impairments (multi-system involvement);To address functional/ADL transfers;For  patient/therapist safety PT goals addressed during session: Mobility/safety with mobility;Balance;Strengthening/ROM        AM-PAC PT "6 Clicks" Mobility   Outcome Measure  Help needed turning from your back to your side while in a flat bed without using bedrails?: Total Help needed moving from lying on your back to sitting on the side of a flat bed without using bedrails?: Total Help needed moving to and from a bed to a chair (including a wheelchair)?: Total Help needed standing up from a chair using your arms (e.g., wheelchair or bedside chair)?: Total Help needed to walk in hospital room?: Total Help needed climbing 3-5 steps with a railing? : Total 6 Click Score: 6    End of Session Equipment Utilized During Treatment: Gait belt;Oxygen Activity Tolerance: Patient tolerated treatment well Patient left: in bed;with call bell/phone within reach;with bed alarm set;with family/visitor present Nurse Communication: Mobility status PT Visit Diagnosis: Hemiplegia and hemiparesis;Other abnormalities of gait and mobility (R26.89) Hemiplegia - Right/Left: Right Hemiplegia - dominant/non-dominant: Dominant Hemiplegia - caused by: Nontraumatic intracerebral hemorrhage     Time: 8453-6468 PT Time Calculation (min) (ACUTE ONLY): 51 min  Charges:  $Therapeutic Exercise: 8-22 mins $Neuromuscular Re-education: 8-22 mins                     Deland Pretty, DPT   Acute Rehabilitation Department Pager #: [phone]   Gaetana Michaelis 10/23/2020, 9:44 AM

## 2020-10-23 NOTE — Progress Notes (Signed)
Occupational Therapy Treatment Patient Details Name: Walter [name] MRN: [id number] DOB: Mar 19, 1956 Today's Date: 10/23/2020    History of present illness 64 yo male presenting 3/28 with R-sided weakness and slurred speech. Imaging revealed acute hemorrhage centered within the left dorsal midbrain extending into the dorsal pons and inferomedial thalamus with intraventricular extension. EVD placement for developing hydrocephalus on 3/29. Extubated 3/30. Pt with noted decrease in EVD drainage overnight 3/31, proximal and distal clots noted, flushed, repeat CT shows significant clot in R lateral ventricle. EVD with another episode of clotting overnight 4/2, intraventricular TpA administered. Reintubated 4/4. 4/6 tracheostomy. 4/10 Doppler shows bilateral DVT. IVC filter placed. CT at chest showing bilateral (R>L) PEs on 4/19. PMH Afib, COVID infection (July 2021), and retinal detachment.   OT comments  Pt making steady progress towards OT goals this session. Pt seen in conjunction with PT to maximize pts activity tolerance. Pt continues to present with impaired cognition, impaired balance and decreased activity tolerance. Pt currently requires MAX A +2 for bed mobility, and min guard- MOD A for sitting balance EOB. Worked on lateral scoot transfer to [facility] with total A +2. Pt requires up to MAX A for UB ADLs and total A for LB ADLs. Pt following one step commands with increased time. Pt would continue to benefit from skilled occupational therapy while admitted and after d/c to address the below listed limitations in order to improve overall functional mobility and facilitate independence with BADL participation. DC plan remains appropriate, will follow acutely per POC.     Follow Up Recommendations  CIR;Supervision/Assistance - 24 hour    Equipment Recommendations  Wheelchair cushion (measurements OT);Wheelchair (measurements OT);Hospital bed    Recommendations for Other Services      Precautions /  Restrictions Precautions Precautions: Fall Precaution Comments: flexiseal Required Braces or Orthoses: Other Brace Other Brace: BUE resting hand splints, see schedule Restrictions Weight Bearing Restrictions: No       Mobility Bed Mobility Overal bed mobility: Needs Assistance Bed Mobility: Supine to Sit;Sit to Sidelying Rolling: +2 for physical assistance;Mod assist   Supine to sit: Max assist;+2 for physical assistance   Sit to sidelying: Max assist;+2 for physical assistance General bed mobility comments: pt needing assist to BLE, able to initiate reaching but needing assist to complete movement due to weakness and increased processing speed    Transfers Overall transfer level: Needs assistance Equipment used: 2 person hand held assist Transfers: Lateral/Scoot Transfers;Squat Pivot Transfers     Squat pivot transfers: Total assist;+2 physical assistance    Lateral/Scoot Transfers: Total assist;+2 physical assistance General transfer comment: pt attempting to push to stand with LUE, required totalA with blocking of knees to initiate squat and hip clearance from bed to scoot along EOB x3    Balance Overall balance assessment: Needs assistance Sitting-balance support: Feet supported;Bilateral upper extremity supported Sitting balance-Leahy Scale: Poor Sitting balance - Comments: minG to modA at times, needs cues or assist due to poor reactions and ability to self-correct Postural control: Posterior lean;Left lateral lean Standing balance support: Bilateral upper extremity supported Standing balance-Leahy Scale: Zero Standing balance comment: reliant on BUE support and totalA of 2                           ADL either performed or assessed with clinical judgement   ADL Overall ADL's : Needs assistance/impaired     Grooming: Wash/dry face;Brushing hair;Oral care;Maximal assistance;Sitting Grooming Details (indicate cue type and reason): sitting  EOB, pt kept  using RUE more to complete UB grooming tasks with support at R elbow             Lower Body Dressing: Total assistance;Bed level Lower Body Dressing Details (indicate cue type and reason): to don socks from bed level   Toilet Transfer Details (indicate cue type and reason): NT         Functional mobility during ADLs: Maximal assistance;+2 for physical assistance General ADL Comments: pt continues to present with impaired balance, impaired cognition and decreased activity tolerance, however pt following one step commands with increased time     Vision Baseline Vision/History: Wears glasses Wears Glasses: At all times Patient Visual Report: Other (comment) (pt able to track R<>L with cues but prefers to gaze to L lower quadrant when sitting EOB)     Perception     Praxis      Cognition Arousal/Alertness: Awake/alert;Lethargic (initially lethargic, more alert with stimuli) Behavior During Therapy: Flat affect Overall Cognitive Status: Impaired/Different from baseline Area of Impairment: Orientation;Attention;Following commands;Safety/judgement                 Orientation Level: Disoriented to;Place;Time;Situation Current Attention Level: Sustained (for short periods)   Following Commands: Follows one step commands with increased time;Follows one step commands consistently Safety/Judgement: Decreased awareness of safety;Decreased awareness of deficits     General Comments: pt initially lethargic, but with improved alertness and command following through session. Pt ~75% command following with L extremities and simple, direct commands. attempting to follow ~50% of commands with RUE. increased time, and at times multimodal cues given        Exercises Exercises: General Lower Extremity General Exercises - Upper Extremity Shoulder Flexion: PROM;Both;10 reps;Seated Shoulder Extension: PROM;Both;10 reps;Seated General Exercises - Lower Extremity Ankle Circles/Pumps:  PROM;Supine;AROM;Seated;15 reps Long Arc Quad: AAROM;Both;5 reps;Seated Heel Slides: PROM;Both;10 reps;Supine Hip ABduction/ADduction: PROM;Both;10 reps;Supine   Shoulder Instructions       General Comments VSS on 5L 28% trach collar    Pertinent Vitals/ Pain       Pain Assessment: Faces Faces Pain Scale: No hurt Pain Intervention(s): Monitored during session  Home Living                                          Prior Functioning/Environment              Frequency  Min 2X/week        Progress Toward Goals  OT Goals(current goals can now be found in the care plan section)  Progress towards OT goals: Progressing toward goals  Acute Rehab OT Goals Patient Stated Goal: none stated today Time For Goal Achievement: 10/27/20 Potential to Achieve Goals: Fair  Plan Discharge plan remains appropriate;Frequency remains appropriate    Co-evaluation      Reason for Co-Treatment: Complexity of the patient's impairments (multi-system involvement);To address functional/ADL transfers;For patient/therapist safety PT goals addressed during session: Mobility/safety with mobility;Balance;Strengthening/ROM OT goals addressed during session: ADL's and self-care      AM-PAC OT "6 Clicks" Daily Activity     Outcome Measure   Help from another person eating meals?: Total Help from another person taking care of personal grooming?: A Lot Help from another person toileting, which includes using toliet, bedpan, or urinal?: Total Help from another person bathing (including washing, rinsing, drying)?: Total Help from another person to put on and taking off regular  upper body clothing?: Total Help from another person to put on and taking off regular lower body clothing?: Total 6 Click Score: 7    End of Session Equipment Utilized During Treatment: Oxygen;Other (comment) (5LM 28% trach collar)  OT Visit Diagnosis: Unsteadiness on feet (R26.81);Other abnormalities of  gait and mobility (R26.89);Muscle weakness (generalized) (M62.81);Hemiplegia and hemiparesis Hemiplegia - Right/Left: Right Hemiplegia - dominant/non-dominant: Dominant Hemiplegia - caused by: Cerebral infarction   Activity Tolerance Patient tolerated treatment well   Patient Left in bed;Other (comment) (in supine with PT and NT present cleaning pt up)   Nurse Communication Mobility status        Time: 9678-9381 OT Time Calculation (min): 41 min  Charges: OT General Charges $OT Visit: 1 Visit OT Treatments $Therapeutic Activity: 8-22 mins  Lenor Derrick., COTA/L Acute Rehabilitation Services [phone]    Barron Schmid 10/23/2020, 10:43 AM

## 2020-10-23 NOTE — Progress Notes (Signed)
Calorie Count Note  48 hour calorie count ordered. No meal tickets were kept over the weekend, spoke with RN. Plan to keep tickets today and follow up tomorrow.   Spoke with pt, wife, and daughter. Per wife this morning was the first time that the patient had really ate anything. She had fed him a yogurt and given him an Ensure Enlive. He did not want any pureed foods from his meal tray.   Spoke with MD. If decision for PEG needs to be made quickly would recommend proceed with PEG as pt has not yet demonstrated the ability to eat adequately consistently. Otherwise would recommend continue nocturnal TF via Cortrak tube while working with patient to increase intake.   Diet: Dysphagia 1 with Thin liquids Supplements: Ensure Enlive BID, Magic cup with meals Nocturnal TF: Vital 1.5 @ 80 ml/hr over 12 hours with 45 ml ProSource TID 200 ml free water every 4 hours  Breakfast: 0% of meal; wife fed him 1 yogurt and 1 ensure enlive    Nutrition Dx: Inadequate oral intake related to decreased cognition and dysphagia as evidenced by meal completion <50%.   Goal: Patient will meet >/= 90% of their needs  Intervention:  Ensure Enlive BID Magic cups with meal trays Nocturnal TF Calorie count  Ajwa Kimberley P., RD, LDN, CNSC See AMiON for contact information

## 2020-10-23 NOTE — Progress Notes (Signed)
Inpatient Rehab Admissions Coordinator:  Saw pt and wife at bedside. Informed wife that there are no beds available in CIR today.  Will continue to follow.   Wolfgang Phoenix, MS, CCC-SLP Admissions Coordinator [phone]

## 2020-10-24 ENCOUNTER — Inpatient Hospital Stay (HOSPITAL_COMMUNITY): Payer: Medicare Other

## 2020-10-24 LAB — GLUCOSE, CAPILLARY
Glucose-Capillary: 102 mg/dL — ABNORMAL HIGH (ref 70–99)
Glucose-Capillary: 107 mg/dL — ABNORMAL HIGH (ref 70–99)
Glucose-Capillary: 115 mg/dL — ABNORMAL HIGH (ref 70–99)
Glucose-Capillary: 131 mg/dL — ABNORMAL HIGH (ref 70–99)
Glucose-Capillary: 140 mg/dL — ABNORMAL HIGH (ref 70–99)
Glucose-Capillary: 153 mg/dL — ABNORMAL HIGH (ref 70–99)
Glucose-Capillary: 95 mg/dL (ref 70–99)

## 2020-10-24 NOTE — Plan of Care (Signed)
  Problem: Education: Goal: Knowledge of General Education information will improve Description: Including pain rating scale, medication(s)/side effects and non-pharmacologic comfort measures Outcome: Progressing   Problem: Health Behavior/Discharge Planning: Goal: Ability to manage health-related needs will improve Outcome: Progressing   Problem: Clinical Measurements: Goal: Ability to maintain clinical measurements within normal limits will improve Outcome: Progressing Goal: Will remain free from infection Outcome: Progressing Goal: Diagnostic test results will improve Outcome: Progressing Goal: Respiratory complications will improve Outcome: Progressing Goal: Cardiovascular complication will be avoided Outcome: Progressing   Problem: Activity: Goal: Risk for activity intolerance will decrease Outcome: Progressing   Problem: Nutrition: Goal: Adequate nutrition will be maintained Outcome: Progressing   Problem: Coping: Goal: Level of anxiety will decrease Outcome: Progressing   Problem: Elimination: Goal: Will not experience complications related to bowel motility Outcome: Progressing Goal: Will not experience complications related to urinary retention Outcome: Progressing   Problem: Pain Managment: Goal: General experience of comfort will improve Outcome: Progressing   Problem: Safety: Goal: Ability to remain free from injury will improve Outcome: Progressing   Problem: Skin Integrity: Goal: Risk for impaired skin integrity will decrease Outcome: Progressing   Problem: Education: Goal: Knowledge of disease or condition will improve Outcome: Progressing Goal: Knowledge of secondary prevention will improve Outcome: Progressing Goal: Knowledge of patient specific risk factors addressed and post discharge goals established will improve Outcome: Progressing Goal: Individualized Educational Video(s) Outcome: Progressing   Problem: Coping: Goal: Will verbalize  positive feelings about self Outcome: Progressing Goal: Will identify appropriate support needs Outcome: Progressing   Problem: Health Behavior/Discharge Planning: Goal: Ability to manage health-related needs will improve Outcome: Progressing   Problem: Self-Care: Goal: Ability to participate in self-care as condition permits will improve Outcome: Progressing Goal: Verbalization of feelings and concerns over difficulty with self-care will improve Outcome: Progressing Goal: Ability to communicate needs accurately will improve Outcome: Progressing   Problem: Nutrition: Goal: Risk of aspiration will decrease Outcome: Progressing Goal: Dietary intake will improve Outcome: Progressing   Problem: Intracerebral Hemorrhage Tissue Perfusion: Goal: Complications of Intracerebral Hemorrhage will be minimized Outcome: Progressing   Problem: Safety: Goal: Non-violent Restraint(s) Outcome: Progressing   

## 2020-10-24 NOTE — Progress Notes (Signed)
Occupational Therapy Treatment Patient Details Name: Walter [name] MRN: [id number] DOB: [birth year] Today's Date: 10/24/2020    History of present illness 65 yo male presenting 3/28 with R-sided weakness and slurred speech. Imaging revealed acute hemorrhage centered within the left dorsal midbrain extending into the dorsal pons and inferomedial thalamus with intraventricular extension. EVD placement for developing hydrocephalus on 3/29. Extubated 3/30. Pt with noted decrease in EVD drainage overnight 3/31, proximal and distal clots noted, flushed, repeat CT shows significant clot in R lateral ventricle. EVD with another episode of clotting overnight 4/2, intraventricular TpA administered. Reintubated 4/4. 4/6 tracheostomy. 4/10 Doppler shows bilateral DVT. IVC filter placed. CT at chest showing bilateral (R>L) PEs on 4/19. PMH Afib, COVID infection (July 2021), and retinal detachment.   OT comments  Patient progressing slowly towards OT goals.  Improved alertness once upright at EOB, increasing functional strength and use of dominant R UE.  Engaged in self feeding using RUE with max assist, hand over hand support for scooping and bringing to mouth due to weakness --will benefit from spoon handles and completion of task from supported sitting chair (rather than EOB).  Patient continues to require max assist +2 for bed mobility, progressed to partial stand at EOB with mod assist +2.  Spouse brought in glasses and pt denies diplopia today, but further assessment recommended due to difficulty noted with undershooting and targeting when reaching.  Continue to recommend CIR.  Will follow.    Follow Up Recommendations  CIR;Supervision/Assistance - 24 hour    Equipment Recommendations  Wheelchair cushion (measurements OT);Wheelchair (measurements OT);Hospital bed    Recommendations for Other Services Rehab consult    Precautions / Restrictions Precautions Precautions: Fall Precaution Comments:  flexiseal, condom cath, Cortrack, trach Required Braces or Orthoses: Other Brace Other Brace: BUE resting hand splints, see schedule Restrictions Weight Bearing Restrictions: No       Mobility Bed Mobility Overal bed mobility: Needs Assistance Bed Mobility: Supine to Sit;Sit to Supine Rolling: +2 for physical assistance;Mod assist   Supine to sit: Max assist;+2 for physical assistance Sit to supine: Max assist;+2 for physical assistance   General bed mobility comments: pt needing assist to BLE, able to initiate reaching with LU E but needing assist to complete movement due to weakness, cueing for sequencing    Transfers Overall transfer level: Needs assistance Equipment used: 2 person hand held assist Transfers: Sit to/from Stand Sit to Stand: +2 physical assistance;Mod assist         General transfer comment: pt performed partial sit>stand 2x with good initiation but inability to achieve full, upright position. Mod A +2 given for power up and fwd translation, B feet blocked, no knee buckling noted.    Balance Overall balance assessment: Needs assistance Sitting-balance support: Feet supported;Bilateral upper extremity supported Sitting balance-Leahy Scale: Poor Sitting balance - Comments: mod A for most of the time in sitting due to posterior lean. Was able to lean fwd when cued but then would lose balance fwd needing mod A as well. Maintained sitting EOB >30 mins, fatigue towards end Postural control: Posterior lean Standing balance support: Bilateral upper extremity supported Standing balance-Leahy Scale: Zero Standing balance comment: reliant on BUE and max A +2 to maintain standing due to posterior lean                           ADL either performed or assessed with clinical judgement   ADL Overall ADL's : Needs assistance/impaired Eating/Feeding:  Maximal assistance Eating/Feeding Details (indicate cue type and reason): assisted with self feeding using R  UE and max assist (pureed pineapple), will benefit from built up handles; HOH support x 4 bites with increased effort required Grooming: Wash/dry face;Minimal assistance;Sitting Grooming Details (indicate cue type and reason): to wipe mouth             Lower Body Dressing: Total assistance;+2 for physical assistance;+2 for safety/equipment;Sit to/from stand Lower Body Dressing Details (indicate cue type and reason): assist to don socks, mod +2 for partial stand     Toileting- Clothing Manipulation and Hygiene: Total assistance;+2 for physical assistance;+2 for safety/equipment;Bed level Toileting - Clothing Manipulation Details (indicate cue type and reason): peri care after flexiseal coming loose     Functional mobility during ADLs: Maximal assistance;Moderate assistance;+2 for physical assistance;+2 for safety/equipment;Cueing for safety;Cueing for sequencing General ADL Comments: pt progressing well, remains limited by decreased activity tolerance, R weakness, cognition and balance     Vision   Additional Comments: spouse brought in glasses, pt reports mild blurred vision and reports no double vision (but reports had some earlier today)-- question diplopia due to difficulty reaching and undershooting with reaching continue assessment.   Perception     Praxis      Cognition Arousal/Alertness: Awake/alert;Lethargic (initially lethargic, more alert with stimuli) Behavior During Therapy: Flat affect Overall Cognitive Status: Impaired/Different from baseline Area of Impairment: Orientation;Attention;Following commands;Safety/judgement;Awareness;Problem solving                 Orientation Level: Disoriented to;Time;Situation Current Attention Level: Sustained   Following Commands: Follows one step commands with increased time;Follows one step commands consistently Safety/Judgement: Decreased awareness of safety;Decreased awareness of deficits Awareness:  Intellectual Problem Solving: Slow processing;Decreased initiation;Difficulty sequencing;Requires verbal cues;Requires tactile cues General Comments: pt initially lethargic, but with improved alertness and command following through session. Pt ~80% command following with L extremities and simple, direct commands. able to follow commands with RUE/LE at times. [name] present during session and he was answering her questions with PMV placed.        Exercises Exercises: General Lower Extremity General Exercises - Lower Extremity Long Arc Quad: AROM;Right;5 reps;Seated Hip Flexion/Marching: AROM;Right;5 reps;Seated Other Exercises Other Exercises: Seated EOB, R UE grasp/release x 5 reps, foward press x 5 reps   Shoulder Instructions       General Comments VSS, rectal tube came out during standing, RN aware. Pt cleaned up with new pads and gown before end of session.    Pertinent Vitals/ Pain       Pain Assessment: Faces Faces Pain Scale: No hurt  Home Living                                          Prior Functioning/Environment              Frequency  Min 2X/week        Progress Toward Goals  OT Goals(current goals can now be found in the care plan section)  Progress towards OT goals: Progressing toward goals  Acute Rehab OT Goals Patient Stated Goal: wants to go home OT Goal Formulation: With family  Plan Discharge plan remains appropriate;Frequency remains appropriate    Co-evaluation    PT/OT/SLP Co-Evaluation/Treatment: Yes Reason for Co-Treatment: Complexity of the patient's impairments (multi-system involvement);For patient/therapist safety;To address functional/ADL transfers;Necessary to address cognition/behavior during functional activity PT goals addressed during  session: Mobility/safety with mobility;Balance;Strengthening/ROM OT goals addressed during session: ADL's and self-care      AM-PAC OT "6 Clicks" Daily Activity      Outcome Measure   Help from another person eating meals?: A Lot Help from another person taking care of personal grooming?: A Lot Help from another person toileting, which includes using toliet, bedpan, or urinal?: Total Help from another person bathing (including washing, rinsing, drying)?: Total Help from another person to put on and taking off regular upper body clothing?: Total Help from another person to put on and taking off regular lower body clothing?: Total 6 Click Score: 8    End of Session Equipment Utilized During Treatment: Oxygen;Other (comment) (5LPM 28% trach collar)  OT Visit Diagnosis: Unsteadiness on feet (R26.81);Other abnormalities of gait and mobility (R26.89);Muscle weakness (generalized) (M62.81);Hemiplegia and hemiparesis Hemiplegia - Right/Left: Right Hemiplegia - dominant/non-dominant: Dominant Hemiplegia - caused by: Cerebral infarction   Activity Tolerance Patient tolerated treatment well   Patient Left in bed;with call bell/phone within reach;with bed alarm set;with family/visitor present;with nursing/sitter in room;with restraints reapplied   Nurse Communication Mobility status        Time: 5885-0277 OT Time Calculation (min): 57 min  Charges: OT General Charges $OT Visit: 1 Visit OT Treatments $Self Care/Home Management : 8-22 mins $Therapeutic Activity: 8-22 mins  Barry Brunner, OT Acute Rehabilitation Services Pager [phone] Office [phone]    Chancy Milroy 10/24/2020, 12:54 PM

## 2020-10-24 NOTE — Progress Notes (Signed)
Inpatient Rehab Admissions Coordinator:   Following for my colleague, Wolfgang Phoenix, for the remainder of the week.  Stopped by to introduce myself to pt and his wife, at bedside.  Will continue to follow for timing of rehab admission pending bed availability.  Will also return to room today to review goals/elos for rehab admit with pt and wife.   Estill Dooms, PT, DPT Admissions Coordinator [phone] 10/24/20  11:22 AM

## 2020-10-24 NOTE — Progress Notes (Signed)
  Speech Language Pathology Treatment: Dysphagia;Passy Muir Speaking valve  Patient Details Name: Walter [name] MRN: [id number] DOB: 1955/09/25 Today's Date: 10/24/2020 Time: 2355-7322 SLP Time Calculation (min) (ACUTE ONLY): 43 min  Assessment / Plan / Recommendation Clinical Impression  Daughter present during session focusing on dysphagia and communication with PMV donned for 40 min. Speech intelligibility in sentences was around 30-40% marked by decreased labial movement and low volume during speech. Frequent demonstration to increase lung capacity and effort during verbalizations needed throughout session as well as decrease pace. He was slow to process and frequently became distracted with people in the hall, his daughter, internal distractions and repetition was given. Several instances of adequate volume and annunciation.  Multiple environmental cues given for orientation to hospital and stated he was in a medical building at end of session. Mod-max problem solving assist to determine month and length of time hospitalized.  Vitals were stable with PMV and no evidence of air trapping. Needed assist to coordinate self feeding with left hand and strength after dropping spoon. Minimal delayed cough after pudding x 1 Puree continues to be appropriate due to cognitive deficits inhibiting solid texture at present. Will continue ST until rehab admission.    HPI HPI: 65 yo male presenting 3/28 with R-sided weakness and slurred speech. Imaging revealed acute hemorrhage centered within the left dorsal midbrain extending into the dorsal pons and inferomedial thalamus with intraventricular extension. EVD placement on 3/29. Extubated 3/30. Decrease in EVD drainage 3/31. Repeat CT shows significant clot in R lateral ventricle. EVD clotted 4/2 and intraventricular TpA administered. Reintubated 4/4. Tracheostomy 4/6. PMH Afib, COVID infection (July 2021), and retinal detachment.  Recent CXR showed. Mild right  base subsegmental  atelectasis/infiltrate.      SLP Plan  Continue with current plan of care       Recommendations  Diet recommendations: Dysphagia 1 (puree);Thin liquid Liquids provided via: Straw;Cup Medication Administration: Whole meds with puree Supervision: Staff to assist with self feeding Compensations: Slow rate;Small sips/bites Postural Changes and/or Swallow Maneuvers: Seated upright 90 degrees      Patient may use Passy-Muir Speech Valve: During all therapies with supervision;Caregiver trained to provide supervision PMSV Supervision: Full MD: Please consider changing trach tube to : Cuffless         General recommendations: Rehab consult Oral Care Recommendations: Oral care BID Follow up Recommendations: Inpatient Rehab SLP Visit Diagnosis: Aphonia (R49.1);Cognitive communication deficit (G25.427) Plan: Continue with current plan of care       GO                Royce Macadamia 10/24/2020, 4:36 PM

## 2020-10-24 NOTE — Progress Notes (Signed)
PROGRESS NOTE    Walter [name]  STM:[id number] DOB: 11-08-1955 DOA: 09/25/2020 PCP: Alroy Dust, L.Marlou Sa, MD   Chief Complain: Weakness, slurred speech  Brief Narrative: Patient is a 65 year old male with history of A. fib not on anticoagulation status post ablation, COVID infection in July 2021 who initially presented with right-sided weakness, slurred speech.  Work-up revealed hemorrhagic stroke.  He was emergently intubated in the ED for airway protection and was admitted under PCCM service.  Hospital course was prolonged and remarkable for persistent hypoxia, pneumonia, encephalopathy, PE/bilateral DVT status post IVC filter placement.  Status post trach .  Currently on trach collar.  Transferred to [facility] service on 10/21/2020.  Eventual plan is to transfer him to CIR.  Medically stable for transfer to CIR.Plan for PEG placement  Assessment & Plan:   Active Problems:   ICH (intracerebral hemorrhage) (HCC)   Intracranial hemorrhage (HCC)   Atrial fibrillation (HCC)   Prediabetes   Leukocytosis   Acute blood loss anemia   Hypernatremia   Pressure injury of skin   Acute hypoxic respiratory failure: Was intubated in the ED for airway protection.  Failure to wean so status post tracheostomy on 4/6.  Off vent since 4/20.  Currently on trach collar on 5 L.  Respiratory status is stable.  Intermittent chest x-ray.  PCCM following.  Continue Passy-Muir valve as tolerated.  Speech therapy following.  Intracerebral hemorrhage: Presented with right-sided weakness, slurred speech.  Code stroke was called on presentation.  CT head showed acute intraparenchymal hemorrhage in the dorsal left pons with subarachnoid extension.  CT head and neck did not show any vascular malformation, and evaluate aneurysm, no LVO. MRI done on 4/8 showed 2-3 punctate infarcts in the left frontal and parietal lobes corresponding to small acute/subacute infarcts.  Echocardiogram showed ejection fraction of 55 to 60%, no wall  motion abnormality.  LDL of 88.  Hemoglobin A1c 5.7.  Not on statin due to elevated LFT, we will consider adding statin on discharge if LFT normalizes. PT/OT recommended CIR on discharge.  Obstructive hydrocephalus: Intraventricular drain placed on 3/30.  EVD stopped working on 4/2, status post alteplase placement resulting in new hemorrhage along the EVD tract.  Repeat imaging showed improvement  Hospital-acquired pneumonia/ventilator associated pneumonia: Sputum culture on 3/30 showed Moraxella. One of the blood cultures also showed Acenatobacter on 4/8,BCID negative.  ID was following.  Treated with Unasyn, completed antibiotics course.  He had intermittent fever while being on antibiotics, could be from DVT/PE. Afebrile since last many days. Leukocytosis resolved.  Currently respiratory status is stable.  Sputum culture sent on 10/16/2020 showed normal flora.  Acute PE/bilateral lower leg DVT: Venous Doppler done on 4/10 showed acute DVT on right lower extremity, age-indeterminate DVT on left.  He has edema of the right  lower extremity .status post IVC filter on 4/10.  CT angio on 4/20 showed bilateral PE.  He was on heparin drip,  changed to Eliquis.  Severe encephalopathy: Associated with intracerebral hemorrhage.  Had intermittent left upper extremity jerking.  Focal seizures could not be ruled out as per LTM EEG, it showed diffuse encephalopathy.On amantidine.  Hypertension: Currently blood pressure stable.  Continue current regimen  Paroxysmal A. fib: Status post ablation/cardioversion in the past.  Was not taking anticoagulation when he was admitted.  Continue current anticoagulation, Eliquis.  On Cardizem for rate control  Urinary retention: resolved. On condom cath  Hyperglycemia: Continue current insulin regimen.  Monitor blood sugars.  Hemoglobin A1c of 5.7  Dysphagia:  Speech therapy closely following.On feeding tube placement.  Also on dysphagia 1 diet. Oral intake remains poor  Dietitian following. I discussed this with his wife and she is agreeable for PEG.  Macrocytic anemia: Hemoglobin ranging from 8-9.  Continue to monitor.  Most likely associated with acute illness, poor oral intake. Normal vitamin F68 and folic acid  Pressure ulcers: Not present on admission.  Wound care following.  Pressure Injury 10/21/20 Head Left;Posterior Stage 1 -  Intact skin with non-blanchable redness of a localized area usually over a bony prominence. Pink non-blanchable scalp with thinning hair on posterior left side of head (Active)  10/21/20 1100  Location: Head  Location Orientation: Left;Posterior  Staging: Stage 1 -  Intact skin with non-blanchable redness of a localized area usually over a bony prominence.  Wound Description (Comments): Pink non-blanchable scalp with thinning hair on posterior left side of head  Present on Admission: No          Nutrition Problem: Inadequate oral intake Etiology: inability to eat      DVT prophylaxis:Eliquis Code Status: Full Family Communication: Discussed with wife at bedside today Status is: Inpatient  Remains inpatient appropriate because:Inpatient level of care appropriate due to severity of illness   Dispo: The patient is from: Home              Anticipated d/c is to: CIR              Patient currently is medically stable for discharge   Difficult to place patient No    Consultants: PCCM, neurology  Procedures: As above  Antimicrobials:  Anti-infectives (From admission, onward)   Start     Dose/Rate Route Frequency Ordered Stop   10/18/20 1100  Ampicillin-Sulbactam (UNASYN) 3 g in sodium chloride 0.9 % 100 mL IVPB        3 g 200 mL/hr over 30 Minutes Intravenous Every 6 hours 10/18/20 0931 10/21/20 0019   10/16/20 1100  Ampicillin-Sulbactam (UNASYN) 3 g in sodium chloride 0.9 % 100 mL IVPB  Status:  Discontinued        3 g 200 mL/hr over 30 Minutes Intravenous Every 6 hours 10/16/20 0948 10/18/20 0859    10/07/20 0000  vancomycin (VANCOREADY) IVPB 1500 mg/300 mL  Status:  Discontinued        1,500 mg 150 mL/hr over 120 Minutes Intravenous Every 12 hours 10/06/20 1000 10/07/20 1406   10/06/20 1200  ceFEPIme (MAXIPIME) 2 g in sodium chloride 0.9 % 100 mL IVPB        2 g 200 mL/hr over 30 Minutes Intravenous Every 8 hours 10/06/20 1000 10/13/20 2225   10/06/20 1100  vancomycin (VANCOREADY) IVPB 2000 mg/400 mL        2,000 mg 200 mL/hr over 120 Minutes Intravenous  Once 10/06/20 1000 10/06/20 1314   10/02/20 0900  Ampicillin-Sulbactam (UNASYN) 3 g in sodium chloride 0.9 % 100 mL IVPB  Status:  Discontinued        3 g 200 mL/hr over 30 Minutes Intravenous Every 6 hours 10/02/20 0812 10/06/20 0954   09/29/20 1130  cefTRIAXone (ROCEPHIN) 2 g in sodium chloride 0.9 % 100 mL IVPB  Status:  Discontinued        2 g 200 mL/hr over 30 Minutes Intravenous Every 24 hours 09/29/20 1037 10/02/20 0812      Subjective:  Patient seen and examined the bedside this morning.  Hemodynamically stable during my evaluation.  He was sleeping when I entered the room.  Wife at the bedside.  Wife says he was exhausted due to station with physical therapy so he is taking rest.  Long discussion held about his nutritional status, wife is interested on going ahead for  PEG and thinks that feeding tube is making him uncomfortable.  Objective: Vitals:   10/24/20 0021 10/24/20 0405 10/24/20 0500 10/24/20 0733  BP:  124/65    Pulse: 82 76  70  Resp: (!) 22 (!) 22    Temp:  98 F (36.7 C)    TempSrc:  Oral    SpO2: 98% 97%  98%  Weight:   105.1 kg   Height:        Intake/Output Summary (Last 24 hours) at 10/24/2020 0751 Last data filed at 10/24/2020 2244 Gross per 24 hour  Intake 3237 ml  Output 600 ml  Net 2637 ml   Filed Weights   10/22/20 0500 10/23/20 0400 10/24/20 0500  Weight: 102.7 kg 102.6 kg 105.1 kg    Examination:  General exam:  not in distress, debilitated, deconditioned, ill looking HEENT:  Feeding tube, trach Respiratory system:  no wheezes or crackles, diminished air entry in the bases Cardiovascular system: S1 & S2 heard, RRR.  Gastrointestinal system: Abdomen is nondistended, soft and nontender. Central nervous system: Sleeping, not alert or oriented today. Extremities: Edema on the right lower extremity, no clubbing or cyanosis. Skin: Pressure ulcers on the buttock, rectal tube   Data Reviewed: I have personally reviewed following labs and imaging studies  CBC: Recent Labs  Lab 10/19/20 0038 10/20/20 0427 10/21/20 0032 10/22/20 0326 10/23/20 0751  WBC 13.0* 10.7* 8.9 8.2 8.4  NEUTROABS  --   --   --  5.3 5.8  HGB 9.7* 9.9* 9.2* 8.9* 9.2*  HCT 31.0* 32.4* 30.8* 28.9* 29.6*  MCV 100.0 102.9* 103.0* 102.8* 102.4*  PLT 140* 156 176 150 975   Basic Metabolic Panel: Recent Labs  Lab 10/18/20 0734 10/19/20 0038 10/19/20 2242 10/20/20 0427 10/21/20 0032 10/22/20 0326 10/23/20 0751  NA 138   < > 145 147* 146* 146* 140  K 3.9   < > 4.1 3.8 3.7 4.1 4.4  CL 101   < > 107 109 110 112* 107  CO2 26   < > '29 28 28 26 28  ' GLUCOSE 243*   < > 120* 124* 131* 136* 153*  BUN 59*   < > 46* 37* 31* 24* 22  CREATININE 1.38*   < > 1.04 0.97 0.91 0.78 0.82  CALCIUM 8.5*   < > 8.5* 8.7* 8.6* 8.7* 8.7*  MG 3.0*  --  2.9*  --   --   --   --    < > = values in this interval not displayed.   GFR: Estimated Creatinine Clearance: 116.1 mL/min (by C-G formula based on SCr of 0.82 mg/dL). Liver Function Tests: Recent Labs  Lab 10/18/20 0734 10/22/20 0326  AST 61* 37  ALT 136* 79*  ALKPHOS 129* 116  BILITOT 1.0 0.8  PROT 6.3* 5.6*  ALBUMIN 2.8* 2.5*   No results for input(s): LIPASE, AMYLASE in the last 168 hours. No results for input(s): AMMONIA in the last 168 hours. Coagulation Profile: No results for input(s): INR, PROTIME in the last 168 hours. Cardiac Enzymes: Recent Labs  Lab 10/18/20 0734  CKTOTAL 97   BNP (last 3 results) No results for input(s): PROBNP  in the last 8760 hours. HbA1C: No results for input(s): HGBA1C in the last 72 hours. CBG: Recent Labs  Lab 10/23/20 1224 10/23/20 1729 10/23/20 2054 10/24/20 0005 10/24/20 0419  GLUCAP 131* 114* 132* 115* 153*   Lipid Profile: No results for input(s): CHOL, HDL, LDLCALC, TRIG, CHOLHDL, LDLDIRECT in the last 72 hours. Thyroid Function Tests: No results for input(s): TSH, T4TOTAL, FREET4, T3FREE, THYROIDAB in the last 72 hours. Anemia Panel: Recent Labs    10/22/20 1020  VITAMINB12 495  FOLATE 9.7   Sepsis Labs: No results for input(s): PROCALCITON, LATICACIDVEN in the last 168 hours.  Recent Results (from the past 240 hour(s))  Culture, Respiratory w Gram Stain     Status: None   Collection Time: 10/16/20 10:56 AM   Specimen: Tracheal Aspirate; Respiratory  Result Value Ref Range Status   Specimen Description TRACHEAL ASPIRATE  Final   Special Requests NONE  Final   Gram Stain   Final    RARE SQUAMOUS EPITHELIAL CELLS PRESENT MODERATE WBC PRESENT, PREDOMINANTLY PMN FEW YEAST FEW GRAM POSITIVE COCCI    Culture   Final    MODERATE Normal respiratory flora-no Staph aureus or Pseudomonas seen Performed at [facility], [place] [address] [place], [place] [address]    Report Status 10/18/2020 FINAL  Final         Radiology Studies: No results found.      Scheduled Meds: . amantadine  200 mg Per Tube BID  . apixaban  5 mg Per Tube BID  . chlorhexidine gluconate (MEDLINE KIT)  15 mL Mouth Rinse BID  . Chlorhexidine Gluconate Cloth  6 each Topical Daily  . diltiazem  30 mg Per Tube Q6H  . doxazosin  2 mg Per Tube Daily  . feeding supplement  237 mL Oral BID BM  . feeding supplement (PROSource TF)  45 mL Per Tube TID  . feeding supplement (VITAL 1.5 CAL)  960 mL Per Tube Q24H  . free water  200 mL Per Tube Q4H  . insulin aspart  0-20 Units Subcutaneous Q4H  . insulin glargine  6 Units Subcutaneous Daily  . mouth rinse  15 mL Mouth Rinse 10 times per day   . ondansetron (ZOFRAN) IV  4 mg Intravenous Q6H  . sodium chloride flush  10-40 mL Intracatheter Q12H   Continuous Infusions: . sodium chloride Stopped (10/19/20 1506)     LOS: 29 days    Time spent: 55 mins,More than 50% of that time was spent in counseling and/or coordination of care.      Shelly Coss, MD [facility] P4/26/2022, 7:51 AM

## 2020-10-24 NOTE — Significant Event (Signed)
Rapid Response Event Note   Reason for Call :  Unequal pupils R>L, double vision  Initial Focused Assessment:  Pt lying in bed, alert. Upon my assessment, NIH is unchanged. See flowsheet for full NIH. Left pupil 18mm, right pupil 38mm. Both pupils are round, reactive, light accommodating. EOMI. Pt denies double vision upon my assessment. He states this comes and goes. Per primary RN, pt complained of double vision yesterday and symptoms later subsided.   Pt skin is warm, dry. No distress. Denies pain or other complaints.   VS: BP 124/66, HR 82, RR 27, SpO2 96% on 5L TC CBG: 107  Interventions:  -CBG  -STAT CT Head ordered per neurology  Plan of Care:  -STAT CT head, follow-up with provider regarding exam -Frequent neuro assessments, with pupil assessment  Call rapid response for additional needs  Event Summary:  MD Notified: Notified by primary RN, orders received Call Time: 1819 Arrival Time: 1825 End Time: 1850  Jennye Moccasin, RN

## 2020-10-24 NOTE — Progress Notes (Signed)
STROKE TEAM PROGRESS NOTE   INTERVAL HISTORY No acute events Vital signs are stable.  Neurological exam is unchanged.  Patient's wife is present at the bedside. Remains stable with improvements in alertness and responsiveness.  Wife at bedside. Patient's neurologic status, prognosis and plan of care reviewed. Questions answered.  Vitals:   10/24/20 0021 10/24/20 0405 10/24/20 0500 10/24/20 0733  BP:  124/65  114/60  Pulse: 82 76  70  Resp: (!) 22 (!) 22  (!) 21  Temp:  98 F (36.7 C)    TempSrc:  Oral  Axillary  SpO2: 98% 97%  98%  Weight:   105.1 kg   Height:       CBC:  Recent Labs  Lab 10/22/20 0326 10/23/20 0751  WBC 8.2 8.4  NEUTROABS 5.3 5.8  HGB 8.9* 9.2*  HCT 28.9* 29.6*  MCV 102.8* 102.4*  PLT 150 175   Basic Metabolic Panel:  Recent Labs  Lab 10/18/20 0734 10/19/20 0038 10/19/20 2242 10/20/20 0427 10/22/20 0326 10/23/20 0751  NA 138   < > 145   < > 146* 140  K 3.9   < > 4.1   < > 4.1 4.4  CL 101   < > 107   < > 112* 107  CO2 26   < > 29   < > 26 28  GLUCOSE 243*   < > 120*   < > 136* 153*  BUN 59*   < > 46*   < > 24* 22  CREATININE 1.38*   < > 1.04   < > 0.78 0.82  CALCIUM 8.5*   < > 8.5*   < > 8.7* 8.7*  MG 3.0*  --  2.9*  --   --   --    < > = values in this interval not displayed.    IMAGING  CT head 4/6 1. Ventricle size shows mild interval enlargement since yesterday. No significant hydrocephalus. Intraventricular hemorrhage stable 2. Right frontal ventricular drain unchanged. Hemorrhage surrounding the drain unchanged. 3. Subarachnoid hemorrhage unchanged. Hemorrhage in the left tectum unchanged. No new hemorrhage identified.  CT head 4/4 1. Interval decrease in intraventricular hemorrhage with decreased size of the lateral ventricles as compared to prior, right greater than left. Right parietal approach ventriculostomy in stable position with tip near the septum pellucidum. 2. Stable small volume subarachnoid and intraparenchymal  hemorrhage along the catheter tract. 3. Stable 1.9 cm hemorrhage at the left dorsal pons. 4. No other new acute intracranial abnormality. 09/29/2020  CT head 4/3 IMPRESSION: 1. New intraventricular hemorrhage in the third and lateral ventricles. Fourth ventricular and midbrain hemorrhage is unchanged. 2. New right frontal EVD which traverses the right lateral ventricle. Blood clot encompasses the lower catheter and ventriculomegaly is similar to prior. 3. Small cortical infarcts are newly seen along the superior left frontal convexity.  09/26/2020 IR Angio IMPRESSION: 1. No evidence of an AVM, dural arteriovenous fistula, aneurysm or other vascular abnormality to explain patient's brainstem hemorrhage.  09/26/2020 CT head:  IMPRESSION: 1. Unchanged left brainstem hemorrhage extending into the fourth ventricle. 2. Worsening hydrocephalus.  09/25/2020 MRI brain w/wo, MR Venogram IMPRESSION: 1. Acute hemorrhage centered within the left dorsal midbrain extending into the dorsal pons and inferomedial thalamus with intraventricular extension. No abnormal enhancement to suggest underlying lesion. 2. Compression of the cerebral aqueduct with possible mild obstructive hydrocephalus. 3. No evidence of dural sinus thrombosis. Attenuation of the left basal vein in the quadrigeminal cistern without definite occlusion.  09/25/2020 CT Angio head/neck IMPRESSION: 1. No vascular malformation, anomaly or aneurysm. 2. No emergent large vessel occlusion or high-grade stenosis of the intracranial arteries. 3. Unchanged size of dorsal left pontine hemorrhage with intraventricular extension.  09/25/2020 CT head code stroke IMPRESSION: Acute intraparenchymal hemorrhage in the dorsal left pons with subarachnoid extension into the cerebral aqueduct and fourth Ventricle.  EEG-LTM 4/5-4/6  This study showed periodic discharges with triphasic morphology at 1.5 to 2.5 Hz which is on the  ictal-interictal continuum with low to intermediate potential for seizures.  Additionally there is evidence of moderate to severe diffuse encephalopathy, nonspecific etiology.  No seizures were seen during the study  EEG adult 10/13/2020 This study is suggestive of moderate diffuse encephalopathy, nonspecific etiology. No seizures or definite epileptiform discharges were seen throughout the recording. [name]   CT HEAD WO CONTRAST  Result Date: 10/17/2020 CLINICAL DATA:  Follow-up intracranial hemorrhage EXAM: CT HEAD WITHOUT CONTRAST TECHNIQUE: Contiguous axial images were obtained from the base of the skull through the vertex without intravenous contrast. COMPARISON:  10/11/2020 FINDINGS: Brain: Intraparenchymal hemorrhage in the left side the upper pons and midbrain no longer shows any hyperdense components. There is an intra-axial cystic space measuring up to 15 mm in diameter, which may communicate with the fourth ventricle. No focal cerebellar abnormality is seen. Continuing diminishing density of blood along the ventriculostomy track in the right frontal region. Persistent brain edema surrounding that. Small amount of subarachnoid blood possibly subdural blood the ventriculostomy entry site appears similar. No evidence of any new or increased bleeding. Small cortical infarctions of the left frontoparietal vertex are barely appreciable. No evidence of new brain infarction. Ventricular size remains stable without evidence of ongoing aqueductal obstruction. Previously seen hyperdense blood dependent within the occipital horns is diminishing. Vascular: No vascular finding. Skull: Normal other than the right frontal burr hole. Sinuses/Orbits: Paranasal sinuses are clear. Orbits are normal. No fluid in the middle ears. Small mastoid effusions. Other: None IMPRESSION: 1. Continuing diminishing density of blood along the ventriculostomy track in the right frontal region. No evidence of any new or  increased bleeding. Diminishing dependent intraventricular blood in the lateral ventricles. Very small amount of subarachnoid blood and possibly subdural blood at the ventriculostomy entry site appears similar. 2. Small cortical infarctions of the left frontoparietal vertex are barely appreciable. No evidence of new or increased infarction. 3. Ventricular size remains stable without evidence of ongoing aqueductal obstruction. 4. 15 mm intra-axial cystic space in the left side of the upper pons and midbrain at the site of the previous hematoma. This may communicate with the fourth ventricle. Electronically Signed   By: Paulina Fusi M.D.   On: 10/17/2020 07:35   CT HEAD WO CONTRAST  Result Date: 10/17/2020 CLINICAL DATA:  Follow-up intracranial hemorrhage EXAM: CT HEAD WITHOUT CONTRAST TECHNIQUE: Contiguous axial images were obtained from the base of the skull through the vertex without intravenous contrast. COMPARISON:  10/11/2020 FINDINGS: Brain: Intraparenchymal hemorrhage in the left side the upper pons and midbrain no longer shows any hyperdense components. There is an intra-axial cystic space measuring up to 15 mm in diameter, which may communicate with the fourth ventricle. No focal cerebellar abnormality is seen. Continuing diminishing density of blood along the ventriculostomy track in the right frontal region. Persistent brain edema surrounding that. Small amount of subarachnoid blood possibly subdural blood the ventriculostomy entry site appears similar. No evidence of any new or increased bleeding. Small cortical infarctions of the left frontoparietal vertex are  barely appreciable. No evidence of new brain infarction. Ventricular size remains stable without evidence of ongoing aqueductal obstruction. Previously seen hyperdense blood dependent within the occipital horns is diminishing. Vascular: No vascular finding. Skull: Normal other than the right frontal burr hole. Sinuses/Orbits: Paranasal sinuses  are clear. Orbits are normal. No fluid in the middle ears. Small mastoid effusions. Other: None IMPRESSION: 1. Continuing diminishing density of blood along the ventriculostomy track in the right frontal region. No evidence of any new or increased bleeding. Diminishing dependent intraventricular blood in the lateral ventricles. Very small amount of subarachnoid blood and possibly subdural blood at the ventriculostomy entry site appears similar. 2. Small cortical infarctions of the left frontoparietal vertex are barely appreciable. No evidence of new or increased infarction. 3. Ventricular size remains stable without evidence of ongoing aqueductal obstruction. 4. 15 mm intra-axial cystic space in the left side of the upper pons and midbrain at the site of the previous hematoma. This may communicate with the fourth ventricle. Electronically Signed   By: Paulina FusiMark  Shogry M.D.   On: 10/17/2020 07:35   CT CHEST W CONTRAST  Result Date: 10/17/2020 CLINICAL DATA:  Fever of unknown origin. EXAM: CT CHEST, ABDOMEN, AND PELVIS WITH CONTRAST TECHNIQUE: Multidetector CT imaging of the chest, abdomen and pelvis was performed following the standard protocol during bolus administration of intravenous contrast. CONTRAST:  100mL OMNIPAQUE IOHEXOL 300 MG/ML  SOLN COMPARISON:  Plain film 10/17/2020.  No prior CTs. FINDINGS: CT CHEST FINDINGS Cardiovascular: Aortic atherosclerosis. Normal heart size, without pericardial effusion. Bilateral pulmonary emboli are identified on this non dedicated exam. Example in the central right pulmonary artery on 28/3. Branch pulmonary arteries to both upper and lower lobes on 32/3. Within the left upper lobe pulmonary artery branch on 20/3. Mediastinum/Nodes: No mediastinal or hilar adenopathy. Lungs/Pleura: No pleural fluid. Endotracheal tube terminates appropriately. Areas of mild peripheral predominant airspace and ground-glass are indeterminate. Example at the left apex on 22/5 and right lower  lobe on 58/5. More dependent bibasilar airspace opacities are likely due to atelectasis. Musculoskeletal: No acute osseous abnormality. CT ABDOMEN PELVIS FINDINGS Hepatobiliary: Mild hepatomegaly at 18.4 cm craniocaudal. Normal gallbladder, without biliary ductal dilatation. Pancreas: Normal, without mass or ductal dilatation. Spleen: Normal in size, without focal abnormality. Adrenals/Urinary Tract: Normal adrenal glands. Bilateral renal sinus cysts, without hydronephrosis. Foley catheter within the urinary bladder. Stomach/Bowel: Nasogastric tube terminating at the gastric antrum. Rectal catheter in place. Scattered colonic diverticula. Normal terminal ileum and appendix. Normal small bowel. Vascular/Lymphatic: Aortic atherosclerosis. IVC filter is appropriately positioned. No abdominopelvic adenopathy. Reproductive: Mild prostatomegaly. Other: No significant free fluid. No free intraperitoneal air. Mild pelvic anasarca. tiny bilateral fat containing inguinal hernias. Musculoskeletal: Degenerative partial fusion of bilateral sacroiliac joints in. Degenerate disc disease at the lumbosacral junction. IMPRESSION: 1. Bilateral, large volume right greater than left pulmonary emboli on this nondedicated exam. No evidence of right heart strain. 2. Mild peripheral ground-glass opacities, favoring atelectasis or infarct. Atypical infection felt less likely. 3. Otherwise, no explanation for fever. 4.  Aortic Atherosclerosis (ICD10-I70.0). 5. Prostatomegaly. These results will be called to the ordering clinician or representative by the Radiologist Assistant, and communication documented in the PACS or [employer]. Electronically Signed   By: Jeronimo GreavesKyle  Talbot M.D.   On: 10/17/2020 14:43   CT ABDOMEN PELVIS W CONTRAST  Result Date: 10/17/2020 CLINICAL DATA:  Fever of unknown origin. EXAM: CT CHEST, ABDOMEN, AND PELVIS WITH CONTRAST TECHNIQUE: Multidetector CT imaging of the chest, abdomen and pelvis was performed  following the standard protocol during bolus administration of intravenous contrast. CONTRAST:  OMNIPAQUE IOHEXOL 300 MG/ML  SOLN COMPARISON:  Plain film 10/17/2020.  No prior CTs. FINDINGS: CT CHEST FINDINGS Cardiovascular: Aortic atherosclerosis. Normal heart size, without pericardial effusion. Bilateral pulmonary emboli are identified on this non dedicated exam. Example in the central right pulmonary artery on 28/3. Branch pulmonary arteries to both upper and lower lobes on 32/3. Within the left upper lobe pulmonary artery branch on 20/3. Mediastinum/Nodes: No mediastinal or hilar adenopathy. Lungs/Pleura: No pleural fluid. Endotracheal tube terminates appropriately. Areas of mild peripheral predominant airspace and ground-glass are indeterminate. Example at the left apex on 22/5 and right lower lobe on 58/5. More dependent bibasilar airspace opacities are likely due to atelectasis. Musculoskeletal: No acute osseous abnormality. CT ABDOMEN PELVIS FINDINGS Hepatobiliary: Mild hepatomegaly at 18.4 cm craniocaudal. Normal gallbladder, without biliary ductal dilatation. Pancreas: Normal, without mass or ductal dilatation. Spleen: Normal in size, without focal abnormality. Adrenals/Urinary Tract: Normal adrenal glands. Bilateral renal sinus cysts, without hydronephrosis. Foley catheter within the urinary bladder. Stomach/Bowel: Nasogastric tube terminating at the gastric antrum. Rectal catheter in place. Scattered colonic diverticula. Normal terminal ileum and appendix. Normal small bowel. Vascular/Lymphatic: Aortic atherosclerosis. IVC filter is appropriately positioned. No abdominopelvic adenopathy. Reproductive: Mild prostatomegaly. Other: No significant free fluid. No free intraperitoneal air. Mild pelvic anasarca. tiny bilateral fat containing inguinal hernias. Musculoskeletal: Degenerative partial fusion of bilateral sacroiliac joints in. Degenerate disc disease at the lumbosacral junction. IMPRESSION: 1.  Bilateral, large volume right greater than left pulmonary emboli on this nondedicated exam. No evidence of right heart strain. 2. Mild peripheral ground-glass opacities, favoring atelectasis or infarct. Atypical infection felt less likely. 3. Otherwise, no explanation for fever. 4.  Aortic Atherosclerosis (ICD10-I70.0). 5. Prostatomegaly. These results will be called to the ordering clinician or representative by the Radiologist Assistant, and communication documented in the PACS or [employer]. Electronically Signed   By: Jeronimo Greaves M.D.   On: 10/17/2020 14:43     PHYSICAL EXAM  Temp:  [98 F (36.7 C)-99 F (37.2 C)] 98 F (36.7 C) (04/26 0405) Pulse Rate:  [70-84] 70 (04/26 0733) Resp:  [16-27] 21 (04/26 0733) BP: (104-130)/(60-78) 114/60 (04/26 0733) SpO2:  [94 %-99 %] 98 % (04/26 0733) FiO2 (%):  [28 %] 28 % (04/26 0733) Weight:  [105.1 kg] 105.1 kg (04/26 0500)  General - Well nourished, well developed middle-aged Caucasian male, on trach collar  Ophthalmologic - fundi not visualized due to noncooperation.  Cardiovascular - Regular rate and rhythm, not in afib.  Neuro - on trach collar now, awake alert and interactive.  Still has copious secretions; he tries to speak.[name]  He does follow commands consistently both midline and appendicular.  He is able to say a few words at times they are difficult to understand.  Right upper extremity 3/5 and left upper and lower extremity 4 -/5.  Right lower extremity 2/5.   ASSESSMENT/PLAN Walter [name] is a 65 y.o. male with history of atrial flutter newly diagnosed with atrial fibrillation in February 2022 not on anticoagulation, follows with Dr. Johney Frame in cardiology.  He presented with right-sided weakness, slurred speech, headache, and vomiting.Last known well when he went to bed at 10 PM on 09/24/2020 and woke up at around 2 AM on the day of admission with slurred speech.  EMS was called.He was very drowsy upon their assessment and  notable right side weakeness.  He had worsening mental status, GCS 5, so was emergently  intubated in ED for airway protection.  His MRI brain showed acute hemorrhage at left midbrain extending into pons with IVH associated with obstructive hydrocephalus.    ICH - Left Dorsalmidbrain ICHextending into dorsal pons and inferior thalamus with mild mass-effect on third ventricle and hydrocephalus,source unknown, suspect occult cavernoma  Code Stroke CT head-Acute IPH in the dorsal left pons with subarachnoid extension into the cerebral aqueduct and fourth ventricle.  CTA head & neck-No vascular malformation, anomaly or aneurysm. No emergent large vessel occlusion or high-grade stenosis. Unchanged size of dorsal left pontine hemorrhage with intraventricular extension.  MRI& MR Venogram-Acute hemorrhage centered within the left dorsal midbrain extending into the dorsal pons and inferomedial thalamus with intraventricular extension. Compression of the cerebral aqueduct with possible mild obstructive hydrocephalus. No evidence of dural sinus thrombosis.   CT Head W/O Contrast 3/29 0250 -Unchanged left brainstem hemorrhage extending into the fourth ventricle. Worsening hydrocephalus.  Diagnostic Cerebral Angiogram - No evidence of an aneurysm, AVM, dural AV fistula or other vascular abnormality to explain patient's known intraparenchymal hemorrhage.  MRI brain 4/8 -stable IVH, midbrain ICH and right frontal EVD tract hemorrhage.  Ventricular size stable.  2-3 punctate infarcts left frontal and parietal lobes, corresponding to small acute/subacute infarcts.  CT head 4/13 stable IVH, ICH, decreased ventricle size  CT repeat 4/19 continue evolution of ICH and IVH, nearly absorbed now  2D Echo- EF:55-60%. No wall motion abnormalities.  LDL- 88  HgbA1c5.7  VTE prophylaxis - subcu heparin  No antithromboticprior to admission,  Initially on heparin IV.  Now on  Eliquis.  Therapy  recommendations:CIR   Disposition:Pending CIR bed availability  Obstructive hydrocephalus   EVD placed on 3/30   CT 4/1 showed clot in right lateral ventricle around catheter.   S/p 1mg  (1cc) alteplase placed in EVD 09/29/20 -> drain well ->ICP measuring 4-11 with a good waveform  CT head (4/3) resolution of most of the IVH and improved hydrocephalus.  EVD stopped working 4/2 overnight. 0.5mg  alteplase placed in EVD 10/01/2020 -> Post alteplase CT head new hemorrhage along the EVD tract an in the right lateral ventricle.  CT repeat 4/4 - decreased IVH and decompressed right lateral ventricle  CT repeat 4/5 -mild interval enlargement of ventricular size, no significant hydrocephalus.  IVH stable.  HCT 4/7 stable, with no appreciable change in overall ventricular size  HCT 4/13 decreased ventricular size  CT repeat 4/19 continue evolution of ICH and IVH, nearly absorbed now  Acute Respiratory Failure  Required intubation on 4/4 due to worsening MS and hypoxia  S/p trach 10/04/20  Off sedation  On trach collar with improving secretions -> speaking valve  Bilateral DVT Bilateral, large volume right greater than left PE  LE venous Doppler 4/10 with bilateral DVTs  Status post IVC filter  CT chest showed bilateral PE, which I believe related to her DVT found on 4/10, not after IVC filter  On Eliquis  Aspiration Pneumonia Community-acquired pneumonia with Moraxella Fever   Tmax 100.6->100.4->101.5->101.7->afebrile->100.1  Sputum culture (3/30): Moraxella  Sputum culture 4/5: neg  WBC 9.8->8.5->8.7->8.9->10.6->12.2->13.0->10.7  CXR 4/7, 4/8 left basilar opacities  CXR 4/19 unremarkable  Blood culture 4/8 positive for Acinetobacter  Off cefepime and vanco -> unasyn (off 4/23)  ID on board - CT abd/pelvis/chest no infection source found  SIRPIDs, resolved Severe encephalopathy, improving  Left UE intermittent jerking with stimulation  LTM EEG: GPEDs with  triphasic wave, generalized SIRPIDs, although focal seizure is in DDx  S/p keppra 10/03/20 -> d/c concerning  for angioedema -> on vimpat 10/04/20-> off vimpat 10/11/20  EEG 4/4 moderate to severe diffuse encephalopathy   Repeat spot EEG 4/15 moderate diffuse encephalopathy  Angioedema, resolved  Upper lip edema, much improved  Concerning for keppra allergy  Hypertension  Home meds:Diltiazem  Currently on Diltiazem 30 mg q6  off clonidine  BP goal Systolic < 160  Long-term BP goal normotensive  Hx of Aflutter PAF  Not on [facility] before admission  S/p CTI ablation for A flutter  off ASA  Not AC candidate at this time.  Afib with RVR intermittent -> sinus now  On Eliquis   Hyperlipidemia  Home meds:none  LDL88, goal < 70  AST/ALT 56/126->61/136  No statin now due to elevated LFTs  May consider to addstatin on discharge if LFT normalized  Dysphagia  Secondary to hemorrhagic stroke  Passed swallow  On Dys 1 and thin liquid  SLP on board  Tube feeds @ 28ml/hr for nocturnal feeding  Encourage po intake  Other Stroke Risk Factors  Advanced Age >/= 49  Snoring: sleep study denied by insurance prior to admission   Other Active Problems:  Hypokalemia  AKI Cre 0.95->1.14->1.38->1.25, on TF  Lethargic - on amantadine   Wound care following  Hospital day #29 I Patient is progressing well and is ready to move to inpatient rehab but no bed is available as per my discussion with rehab coordinator today.  Long discussion with wife at the bedside and answered questions.  Greater than 50% time during this 25-minute visit was spent in counseling and coordination of care and discussion with care team.  Delia Heady MD     10/24/2020 7:57 AM

## 2020-10-24 NOTE — Progress Notes (Signed)
Physical Therapy Treatment Patient Details Name: Walter [name] MRN: [id number] DOB: 16-Jul-1955 Today's Date: 10/24/2020    History of Present Illness 65 yo male presenting 3/28 with R-sided weakness and slurred speech. Imaging revealed acute hemorrhage centered within the left dorsal midbrain extending into the dorsal pons and inferomedial thalamus with intraventricular extension. EVD placement for developing hydrocephalus on 3/29. Extubated 3/30. Pt with noted decrease in EVD drainage overnight 3/31, proximal and distal clots noted, flushed, repeat CT shows significant clot in R lateral ventricle. EVD with another episode of clotting overnight 4/2, intraventricular TpA administered. Reintubated 4/4. 4/6 tracheostomy. 4/10 Doppler shows bilateral DVT. IVC filter placed. CT at chest showing bilateral (R>L) PEs on 4/19. PMH Afib, COVID infection (July 2021), and retinal detachment.    PT Comments    Pt lethargic initially but much more alert once sitting up and speaking appropriately with PMV with wife and therapists. Pt sat EOB with mod A due to posterior lean at most times, when activating core, is able to take wt fwd but loses balance fwd once out of BOS. Practiced sit>stand 2x with mod A +2. Pt initiating independently, feet blocked, max A +2 given for support once up and pt unable to achieve full, upright position. Continue to recommend CIR as pt shows good motivation and progress. PT will continue to follow.   Follow Up Recommendations  CIR     Equipment Recommendations  Hospital bed (lift)    Recommendations for Other Services Rehab consult     Precautions / Restrictions Precautions Precautions: Fall Precaution Comments: flexiseal, condom cath, Cortrack, trach Required Braces or Orthoses: Other Brace Other Brace: BUE resting hand splints, see schedule Restrictions Weight Bearing Restrictions: No    Mobility  Bed Mobility Overal bed mobility: Needs Assistance Bed Mobility:  Supine to Sit;Sit to Supine Rolling: +2 for physical assistance;Mod assist   Supine to sit: Max assist;+2 for physical assistance Sit to supine: Max assist;+2 for physical assistance   General bed mobility comments: pt needing assist to BLE, able to initiate reaching but needing assist to complete movement due to weakness    Transfers Overall transfer level: Needs assistance Equipment used: 2 person hand held assist Transfers: Sit to/from Stand Sit to Stand: +2 physical assistance;Mod assist         General transfer comment: pt performed partial sit>stand 2x with good initiation but inability to achieve full, upright position. Mod A +2 given for power up and fwd translation, B feet blocked, no knee buckling noted.  Ambulation/Gait                 Stairs             Wheelchair Mobility    Modified Rankin (Stroke Patients Only) Modified Rankin (Stroke Patients Only) Pre-Morbid Rankin Score: No symptoms Modified Rankin: Severe disability     Balance Overall balance assessment: Needs assistance Sitting-balance support: Feet supported;Bilateral upper extremity supported Sitting balance-Leahy Scale: Poor Sitting balance - Comments: mod A for most of the time in sitting due to posterior lean. Was able to lean fwd when cued but then would lose balance fwd needing mod A as well. Maintained sitting EOB >30 mins, fatigue towards end Postural control: Posterior lean Standing balance support: Bilateral upper extremity supported Standing balance-Leahy Scale: Zero Standing balance comment: reliant on BUE and max A +2 to maintain standing due to posterior lean  Cognition Arousal/Alertness: Awake/alert;Lethargic (initially lethargic, more alert with stimuli) Behavior During Therapy: Flat affect Overall Cognitive Status: Impaired/Different from baseline Area of Impairment: Orientation;Attention;Following commands;Safety/judgement                  Orientation Level: Disoriented to;Time;Situation Current Attention Level: Sustained   Following Commands: Follows one step commands with increased time;Follows one step commands consistently Safety/Judgement: Decreased awareness of safety;Decreased awareness of deficits     General Comments: pt initially lethargic, but with improved alertness and command following through session. Pt ~80% command following with L extremities and simple, direct commands. able to follow commands with RUE/LE at times. [name] present during session and he was answering her questions with PMV placed.      Exercises General Exercises - Lower Extremity Long Arc Quad: AROM;Right;5 reps;Seated Hip Flexion/Marching: AROM;Right;5 reps;Seated    General Comments General comments (skin integrity, edema, etc.): VSS, rectal tube came out during standing, RN aware. Pt cleaned up with new pads and gown before end of session.      Pertinent Vitals/Pain Pain Assessment: Faces Faces Pain Scale: No hurt    Home Living                      Prior Function            PT Goals (current goals can now be found in the care plan section) Acute Rehab PT Goals Patient Stated Goal: wants to go home PT Goal Formulation: With patient Time For Goal Achievement: 10/30/20 Potential to Achieve Goals: Fair Progress towards PT goals: Progressing toward goals    Frequency    Min 4X/week      PT Plan Current plan remains appropriate    Co-evaluation PT/OT/SLP Co-Evaluation/Treatment: Yes Reason for Co-Treatment: Complexity of the patient's impairments (multi-system involvement);Necessary to address cognition/behavior during functional activity;For patient/therapist safety PT goals addressed during session: Mobility/safety with mobility;Balance;Strengthening/ROM        AM-PAC PT "6 Clicks" Mobility   Outcome Measure  Help needed turning from your back to your side while in a flat bed  without using bedrails?: Total Help needed moving from lying on your back to sitting on the side of a flat bed without using bedrails?: Total Help needed moving to and from a bed to a chair (including a wheelchair)?: Total Help needed standing up from a chair using your arms (e.g., wheelchair or bedside chair)?: Total Help needed to walk in hospital room?: Total Help needed climbing 3-5 steps with a railing? : Total 6 Click Score: 6    End of Session Equipment Utilized During Treatment: Oxygen Activity Tolerance: Patient tolerated treatment well Patient left: in bed;with call bell/phone within reach;with bed alarm set;with family/visitor present Nurse Communication: Mobility status PT Visit Diagnosis: Hemiplegia and hemiparesis;Other abnormalities of gait and mobility (R26.89) Hemiplegia - Right/Left: Right Hemiplegia - dominant/non-dominant: Dominant Hemiplegia - caused by: Nontraumatic intracerebral hemorrhage     Time: 4401-0272 PT Time Calculation (min) (ACUTE ONLY): 57 min  Charges:  $Therapeutic Activity: 8-22 mins $Neuromuscular Re-education: 8-22 mins                     Lyanne Co, PT  Acute Rehab Services  Pager [phone] Office [phone]    Lawana Chambers Ariann Khaimov 10/24/2020, 11:21 AM

## 2020-10-24 NOTE — Progress Notes (Signed)
Calorie Count Note  48 hour calorie count ordered.  Note calorie count was supposed to have occurred over the weekend, but no meals were documented by RN staff. Per RD note from yesterday, pt's wife reported that yesterday morning was really the first time the pt ate anything (1 yogurt and 1 Ensure). RD communicated with MD that pt would likely benefit from additional days of monitoring, but recommended pt have PEG if decision needed to be made quickly.   Per MD note from today, plan is to proceed with PEG placement. Will continue with current nutrition plan of care and will follow-up tomorrow to provide information regarding how many kcals/protein the pt consumed po today (day 2 of second attempt at calorie count) and will also adjust nutrition plan of care based on final results.   Diet: Dysphagia 1 with thin liquids Supplements: Ensure Enlive BID, Magic Cup with meals Nocturnal TF: Vital 1.5 @ 80 ml/hr over 12 hours with 45 ml ProSource TID 200 ml free water every 4 hours  Day 1 Results (4/25): Breakfast: 80 kcals, 12g protein Lunch: 345 kcals, 22g protein Dinner: 0 kcals, 0g protein Supplements: 350 kcals, 20g protein  Total intake: 775 kcal (32% of minimum estimated needs)  54g protein (47% of minimum estimated needs)  Nutrition Dx: Inadequate oral intake related to decreased cognition and dysphagia as evidenced by meal completion <50%. - ongoing  Goal: Patient will meet >/= 90% of their needs - progressing  Intervention: -Ensure Enlive BID -Magic cups with meal trays -Nocturnal TF -Calorie count, will follow-up tomorrow with final results/recommendations  Eugene Gavia, MS, RD, LDN RD pager number and weekend/on-call pager number located in Amion.

## 2020-10-25 ENCOUNTER — Inpatient Hospital Stay (HOSPITAL_COMMUNITY): Payer: Medicare Other

## 2020-10-25 ENCOUNTER — Encounter (HOSPITAL_COMMUNITY): Payer: Medicare Other

## 2020-10-25 DIAGNOSIS — I82401 Acute embolism and thrombosis of unspecified deep veins of right lower extremity: Secondary | ICD-10-CM

## 2020-10-25 DIAGNOSIS — L8990 Pressure ulcer of unspecified site, unspecified stage: Secondary | ICD-10-CM

## 2020-10-25 LAB — BASIC METABOLIC PANEL
Anion gap: 9 (ref 5–15)
BUN: 20 mg/dL (ref 8–23)
CO2: 23 mmol/L (ref 22–32)
Calcium: 8.8 mg/dL — ABNORMAL LOW (ref 8.9–10.3)
Chloride: 101 mmol/L (ref 98–111)
Creatinine, Ser: 0.72 mg/dL (ref 0.61–1.24)
GFR, Estimated: >60 mL/min (ref >60)
Glucose, Bld: 119 mg/dL — ABNORMAL HIGH (ref 70–99)
Potassium: 4.5 mmol/L (ref 3.5–5.1)
Sodium: 133 mmol/L — ABNORMAL LOW (ref 135–145)

## 2020-10-25 LAB — CBC WITH DIFFERENTIAL/PLATELET
Abs Immature Granulocytes: 0.05 10*3/uL (ref 0.00–0.07)
Basophils Absolute: 0 10*3/uL (ref 0.0–0.1)
Basophils Relative: 0 %
Eosinophils Absolute: 0.8 10*3/uL — ABNORMAL HIGH (ref 0.0–0.5)
Eosinophils Relative: 10 %
HCT: 28.7 % — ABNORMAL LOW (ref 39.0–52.0)
Hemoglobin: 9 g/dL — ABNORMAL LOW (ref 13.0–17.0)
Immature Granulocytes: 1 %
Lymphocytes Relative: 21 %
Lymphs Abs: 1.7 10*3/uL (ref 0.7–4.0)
MCH: 32 pg (ref 26.0–34.0)
MCHC: 31.4 g/dL (ref 30.0–36.0)
MCV: 102.1 fL — ABNORMAL HIGH (ref 80.0–100.0)
Monocytes Absolute: 0.4 10*3/uL (ref 0.1–1.0)
Monocytes Relative: 5 %
Neutro Abs: 5.3 10*3/uL (ref 1.7–7.7)
Neutrophils Relative %: 63 %
Platelets: 181 10*3/uL (ref 150–400)
RBC: 2.81 MIL/uL — ABNORMAL LOW (ref 4.22–5.81)
RDW: 15 % (ref 11.5–15.5)
WBC: 8.3 10*3/uL (ref 4.0–10.5)
nRBC: 0 % (ref 0.0–0.2)

## 2020-10-25 LAB — GLUCOSE, CAPILLARY
Glucose-Capillary: 113 mg/dL — ABNORMAL HIGH (ref 70–99)
Glucose-Capillary: 119 mg/dL — ABNORMAL HIGH (ref 70–99)
Glucose-Capillary: 120 mg/dL — ABNORMAL HIGH (ref 70–99)
Glucose-Capillary: 121 mg/dL — ABNORMAL HIGH (ref 70–99)
Glucose-Capillary: 87 mg/dL (ref 70–99)

## 2020-10-25 LAB — PROTIME-INR
INR: 1.1 (ref 0.8–1.2)
Prothrombin Time: 14.4 seconds (ref 11.4–15.2)

## 2020-10-25 LAB — HEPARIN LEVEL (UNFRACTIONATED): Heparin Unfractionated: 1.02 IU/mL — ABNORMAL HIGH (ref 0.30–0.70)

## 2020-10-25 LAB — APTT: aPTT: 34 seconds (ref 24–36)

## 2020-10-25 MED ORDER — APIXABAN 5 MG PO TABS: 5.0000 mg | ORAL_TABLET | ORAL | Status: DC

## 2020-10-25 MED ORDER — APIXABAN 5 MG PO TABS
5.0000 mg | ORAL_TABLET | Freq: Two times a day (BID) | ORAL | Status: DC
  Administered 2020-10-25 – 2020-10-28 (×6): 5 mg
  Filled 2020-10-25 (×6): qty 1

## 2020-10-25 MED ORDER — APIXABAN 5 MG PO TABS
5.0000 mg | ORAL_TABLET | ORAL | Status: AC
  Administered 2020-10-25: 5 mg

## 2020-10-25 MED ORDER — APIXABAN 5 MG PO TABS: 5.0000 mg | ORAL_TABLET | Freq: Two times a day (BID) | ORAL | Status: DC

## 2020-10-25 NOTE — Progress Notes (Signed)
Right lower extremity venous duplex and IVC/iliac vein duplex completed.  Refer to "CV Proc" under chart review to view preliminary results.  Preliminary results discussed with Dr. Catha Gosselin.  10/25/2020 4:38 PM Eula Fried., MHA, RVT, RDCS, RDMS

## 2020-10-25 NOTE — Progress Notes (Signed)
Physical Therapy Treatment Patient Details Name: Walter [name] MRN: [id number] DOB: 14-Jun-1956 Today's Date: 10/25/2020    History of Present Illness Pt is a 65 y.o. male admitted 09/25/20 with R-side weakness, slurred speach. Imaging showed acute hemorrhage in L dorsal midbrain extending into dorsal pons and thalamus with intraventricular extension. Developing hydrocephalus s/p EVD placement 3/29. ETT 3/28-3/30. Noted decrease in EVD drainage overnight 3/31, proximal and distal clots noted, flushed; repeat CT showed clot in R lateral ventricle. Pt with another clot episode 4/2; IV tPA administered. Re-intubated 4/4. Trach placed 4/6. BLE DVTs on Doppler 4/10. Chest CT 4/19 with bilateral PEs. Rapid called 4/26 due to asymmetric pupils; stat head CT with no acute injury, improving hemorrhage and edema. Considering PEG placement due to poor oral intake. PMH includes afib, COVID (12/2019), retinal detachment.   PT Comments    Today's session focused on bed-level activity; pt with bouts of bladder/bowel incontinence, performed multiple rolls and scooting up/down with mod-maxA+2, dependent for pericare and hygiene. Pt verbalizing well with soft voice with PMV placed, as well as following majority of simple commands. Pt with significant coughing (sometimes productive) and c/o difficulty breathing after bed mobility; SpO2 96% on RA; RN removed PMV and placed O2 with improvement in symptoms. Daughter present and supportive. Continue to recommend intensive CIR-level therapies to maximize functional mobility and independence.   Follow Up Recommendations  CIR;Supervision/Assistance - 24 hour     Equipment Recommendations  Wheelchair (measurements PT);Wheelchair cushion (measurements PT);Hospital bed;Mechanical lift    Recommendations for Other Services       Precautions / Restrictions Precautions Precautions: Fall;Other (comment) Precaution Comments: bladder/bowel incontinence; condom cath, Cortrak,  trach, PMV; bilateral mitts Required Braces or Orthoses: Other Brace Other Brace: BUE resting hand splints, see schedule Restrictions Weight Bearing Restrictions: No    Mobility  Bed Mobility Overal bed mobility: Needs Assistance Bed Mobility: Rolling Rolling: Mod assist;+2 for physical assistance         General bed mobility comments: Pt received on bed pan with no RN/NT available to help; assist +1 to partially roll onto L-side to remove bed pain, maxA to minimally scoot up, pt able to assist with BLEs pushing against foot rest, unable to assist with UE support on bed rails due to decreased grip strength; pt with additional bowel/bladder incontinence, required modA+2 to roll R/L for pericare, hygiene and linen change; maxA+2 to scoot up in bed. Pt with bouts of significant productive cough requiring suction for secretions; furhter mobility deferred secondary to wheezing and c/o difficulty breathing    Transfers                    Ambulation/Gait                 Stairs             Wheelchair Mobility    Modified Rankin (Stroke Patients Only)       Balance                                            Cognition Arousal/Alertness: Awake/alert (becoming more fatigued/lethargic with activity) Behavior During Therapy: Flat affect;Restless Overall Cognitive Status: Impaired/Different from baseline Area of Impairment: Orientation;Attention;Following commands;Safety/judgement;Awareness;Problem solving                   Current Attention Level: Sustained   Following Commands: Follows  one step commands consistently;Follows one step commands with increased time Safety/Judgement: Decreased awareness of safety;Decreased awareness of deficits Awareness: Intellectual Problem Solving: Slow processing;Decreased initiation;Difficulty sequencing;Requires verbal cues;Requires tactile cues General Comments: Pt following majority of direct,  simple verbal commands this session; pt becoming more fatigued/lethargic with activity requiring cues to open eyes. Verbalizing well with PMV on, although verbalizing less towards end of session with fatigue and significant coughing      Exercises Other Exercises Other Exercises: TKE against bed foot plate to scoot up in bed 5x (decreased bed length with each trial); overhead reaching (pt requires AAROM for shoulder flex)    General Comments General comments (skin integrity, edema, etc.): pt endorses difficulty breathing after bed-level activity, significant cough (productive at times); HOB elevated to >40', SpO2 96% on RA, HR 98; RN present and removed PMV and placed O2 over trach, some improvement in symptoms      Pertinent Vitals/Pain Pain Assessment: No/denies pain Faces Pain Scale: Hurts a little bit Pain Location: some grimacing with rolling, but reporting no pain when asked Pain Descriptors / Indicators: Grimacing Pain Intervention(s): Monitored during session;Repositioned    Home Living                      Prior Function            PT Goals (current goals can now be found in the care plan section) Progress towards PT goals: Progressing toward goals    Frequency    Min 4X/week      PT Plan Current plan remains appropriate    Co-evaluation              AM-PAC PT "6 Clicks" Mobility   Outcome Measure  Help needed turning from your back to your side while in a flat bed without using bedrails?: A Lot Help needed moving from lying on your back to sitting on the side of a flat bed without using bedrails?: Total Help needed moving to and from a bed to a chair (including a wheelchair)?: Total Help needed standing up from a chair using your arms (e.g., wheelchair or bedside chair)?: Total Help needed to walk in hospital room?: Total Help needed climbing 3-5 steps with a railing? : Total 6 Click Score: 7    End of Session   Activity Tolerance: Patient  tolerated treatment well;Patient limited by fatigue;Treatment limited secondary to medical complications (Comment) Patient left: in bed;with call bell/phone within reach;with bed alarm set;with family/visitor present Nurse Communication: Mobility status (coughing/difficulty breathing; ok to leave bilateral mitts off with family present to supervise) PT Visit Diagnosis: Hemiplegia and hemiparesis;Other abnormalities of gait and mobility (R26.89) Hemiplegia - Right/Left: Right Hemiplegia - dominant/non-dominant: Dominant Hemiplegia - caused by: Nontraumatic intracerebral hemorrhage     Time: 1631-1711 PT Time Calculation (min) (ACUTE ONLY): 40 min  Charges:  $Therapeutic Activity: 38-52 mins                     Ina Homes, PT, DPT Acute Rehabilitation Services  Pager [phone] Office [phone]  Malachy Chamber 10/25/2020, 5:32 PM

## 2020-10-25 NOTE — Progress Notes (Signed)
PROGRESS NOTE    Walter [name]  MQK:[id number] DOB: [birth year] DOA: 09/25/2020 PCP: Alroy Dust, L.Marlou Sa, MD   Brief Narrative:  HPI on 09/25/2020 by Dr. Amie Portland (neurology) Walter [name] is a 65 y.o. male past medical history of atrial flutter newly diagnosed with atrial fibrillation in February not on anticoagulation, follows with Dr. Rayann Heman in cardiology, last known well when he went to bed at 10 PM on 09/24/2020 and woke up at around 2 AM this morning and complained to his wife that he was having some discomfort in his head or ear. She was unable to understand him well and thought his speech was slurred.  EMS was called.  He was very drowsy and sleepy upon their assessment and was weaker on the right side in comparison to the left.  His vitals were not too abnormal-systolic blood pressures were in the 1 40-1 50 range on scene. He is not on any anticoagulation.  No prior history of strokes. He was brought in as an acute code stroke LVO positive. Evaluated at the ED bridge, with a GCS of 5. Right after the stat CT and CTA, he was emergently intubated due to inability to protect his airway-given GCS of 5.  Wife arrived much later. Provided history that nothing was out of the ordinary when he went to bed at 10 PM. Recent trip to [place] is the only travel history. She said that they have been cleaning their attic and there is although they did over the weekend.  Interim history Patient presented with acute CVA. Hospital course complicated by respiratory failure with hypoxia, s/p trach now on trach collar, pneumonia, PE/bilateral DVT s/p IVC filter.  Pending CIR. Assessment & Plan   Intracerebral hemorrhage -Patient presented with right-sided weakness, slurred speech.  Code stroke was called on presentation. -CT head showed acute intraparenchymal hemorrhage in the dorsal left pons with subarachnoid extension. -CT head and neck did not show vascular malformation, no LVO. -MRI on 4/8  showed 2-3 punctate infarcts in the left frontal and parietal lobes corresponding to small acute/subacute infarcts. -Echocardiogram showed EF 55 to 60%, no wall motion abnormalities -LDL 88, hemoglobin A1c 5.7 -Currently not on statin due to elevated LFTs -PT and OT recommending SAR  Acute hypoxic respiratory failure -Patient was intubated in the ED for airway protection -Failure to wean, status post tracheostomy on 10/04/2020.  Patient has been off of ventilator since 10/18/2020. -Currently on trach collar and on 5 L O2 -Continue Passy-Muir valve as tolerated -Speech therapy following as well as PCCM  Obstructive hydrocephalus -Intraventricular drain placed on 08/3020 -EVD stopped working on 09/30/2020 status post alteplase placement resulting in a new hemorrhage along the EVD tract -Repeat imaging showed improvement  Hospital-acquired pneumonia/ventilator associated pneumonia -Sputum culture from 09/27/20 was positive for Moraxella -Blood cultures show Acenatobacter -Infectious disease consulted and appreciated -Patient was treated with Unasyn and completed antibiotic course -Sputum culture on 10/16/2020 showed normal flora  Acute PE/bilateral lower extremity DVT -Lower extremity Doppler on 10/08/2020 showed acute DVT right lower extremity, age-indeterminate DVT on the left -Patient does have significant edema of the right lower extremity status post IVC filter placement on 4/10 -CTA showed bilateral PE on 09/2020 -Patient was on heparin drip and then transition to Eliquis  Severe encephalopathy -Secondary to the above -Patient has had intermittent left upper extremity jerking -Focal seizures cannot be ruled out per LTM EEG which showed diffuse encephalopathy -Continue amantadine  Essential hypertension -BP currently stable  Paroxysmal atrial fibrillation -Status  post ablation/cardioversion in the past -Was not on anticoagulation on admission -Currently on Eliquis -Continue  Cardizem for rate control  Urinary retention -Resolved, continue condom cath  Hyperglycemia -As above hemoglobin A1c 5.7 -Continue insulin sliding scale and CBG monitoring  Dysphagia -Patient with core track in place -Speech therapy currently following and has placed patient on dysphagia 1 diet -PEG tube had been discussed with family however at this time would urge that we give patient more time prior to placement of PEG tube.  Discussed this with Dr. Leonie Man, neurology, who is also in agreement  Macrocytic anemia -Suspect secondary to acute illness as well as poor oral intake -Hemoglobin currently stable  Pressure ulcers -Wound care following Pressure Injury 10/21/20 Head Left;Posterior Stage 1 -  Intact skin with non-blanchable redness of a localized area usually over a bony prominence. Pink non-blanchable scalp with thinning hair on posterior left side of head (Active)  10/21/20 1100  Location: Head  Location Orientation: Left;Posterior  Staging: Stage 1 -  Intact skin with non-blanchable redness of a localized area usually over a bony prominence.  Wound Description (Comments): Pink non-blanchable scalp with thinning hair on posterior left side of head  Present on Admission: No   DVT Prophylaxis Eliquis  Code Status: Full  Family Communication: Wife via phone  Disposition Plan:  Status is: Inpatient  Remains inpatient appropriate because:Inpatient level of care appropriate due to severity of illness   Dispo: The patient is from: Home              Anticipated d/c is to: CIR              Patient currently is medically stable to d/c.   Difficult to place patient No   Consultants Neurology Inpatient rehab PCCM  Procedures  Intubation/extubation Trach placement Echocardiogram Lower extremity Doppler IVC filter  Antibiotics   Anti-infectives (From admission, onward)   Start     Dose/Rate Route Frequency Ordered Stop   10/18/20 1100  Ampicillin-Sulbactam  (UNASYN) 3 g in sodium chloride 0.9 % 100 mL IVPB        3 g 200 mL/hr over 30 Minutes Intravenous Every 6 hours 10/18/20 0931 10/21/20 0019   10/16/20 1100  Ampicillin-Sulbactam (UNASYN) 3 g in sodium chloride 0.9 % 100 mL IVPB  Status:  Discontinued        3 g 200 mL/hr over 30 Minutes Intravenous Every 6 hours 10/16/20 0948 10/18/20 0859   10/07/20 0000  vancomycin (VANCOREADY) IVPB 1500 mg/300 mL  Status:  Discontinued        1,500 mg 150 mL/hr over 120 Minutes Intravenous Every 12 hours 10/06/20 1000 10/07/20 1406   10/06/20 1200  ceFEPIme (MAXIPIME) 2 g in sodium chloride 0.9 % 100 mL IVPB        2 g 200 mL/hr over 30 Minutes Intravenous Every 8 hours 10/06/20 1000 10/13/20 2225   10/06/20 1100  vancomycin (VANCOREADY) IVPB 2000 mg/400 mL        2,000 mg 200 mL/hr over 120 Minutes Intravenous  Once 10/06/20 1000 10/06/20 1314   10/02/20 0900  Ampicillin-Sulbactam (UNASYN) 3 g in sodium chloride 0.9 % 100 mL IVPB  Status:  Discontinued        3 g 200 mL/hr over 30 Minutes Intravenous Every 6 hours 10/02/20 0812 10/06/20 0954   09/29/20 1130  cefTRIAXone (ROCEPHIN) 2 g in sodium chloride 0.9 % 100 mL IVPB  Status:  Discontinued        2 g  200 mL/hr over 30 Minutes Intravenous Every 24 hours 09/29/20 1037 10/02/20 3143      Subjective:   Walter [name] seen and examined today.  Patient with limited interaction although was able to mouth a few words and follow commands.  Feels he is ready to get out of hospital.  No complaints of pain or chest pain, no shortness of breath at this time.  Objective:   Vitals:   10/25/20 0322 10/25/20 0805 10/25/20 1114 10/25/20 1300  BP: 126/63   125/68  Pulse: 73 78 86 86  Resp: 19 (!) 28 (!) 22 19  Temp: 99 F (37.2 C)     TempSrc: Axillary     SpO2: 97% 97% 97% 98%  Weight:      Height:        Intake/Output Summary (Last 24 hours) at 10/25/2020 1541 Last data filed at 10/25/2020 1000 Gross per 24 hour  Intake 954.67 ml  Output 700 ml   Net 254.67 ml   Filed Weights   10/22/20 0500 10/23/20 0400 10/24/20 0500  Weight: 102.7 kg 102.6 kg 105.1 kg    Exam  General: Well developed, deconditioned, chronically ill-appearing  HEENT: NCAT, mucous membranes moist.  Cortrak  Neck: Trach  Cardiovascular: S1 S2 auscultated, RRR  Respiratory: Diminished breath sounds anteriorly  Abdomen: Soft, nontender, nondistended, + bowel sounds  Extremities: warm dry without cyanosis clubbing or edema of LLE.  RLE edema  Neuro: Awake and alert, able to converse however limited  Skin: skin breakdown on buttocks   Psych: appropriate   Data Reviewed: I have personally reviewed following labs and imaging studies  CBC: Recent Labs  Lab 10/20/20 0427 10/21/20 0032 10/22/20 0326 10/23/20 0751 10/25/20 0212  WBC 10.7* 8.9 8.2 8.4 8.3  NEUTROABS  --   --  5.3 5.8 5.3  HGB 9.9* 9.2* 8.9* 9.2* 9.0*  HCT 32.4* 30.8* 28.9* 29.6* 28.7*  MCV 102.9* 103.0* 102.8* 102.4* 102.1*  PLT 156 176 150 175 888   Basic Metabolic Panel: Recent Labs  Lab 10/19/20 2242 10/20/20 0427 10/21/20 0032 10/22/20 0326 10/23/20 0751 10/25/20 0212  NA 145 147* 146* 146* 140 133*  K 4.1 3.8 3.7 4.1 4.4 4.5  CL 107 109 110 112* 107 101  CO2 '29 28 28 26 28 23  ' GLUCOSE 120* 124* 131* 136* 153* 119*  BUN 46* 37* 31* 24* 22 20  CREATININE 1.04 0.97 0.91 0.78 0.82 0.72  CALCIUM 8.5* 8.7* 8.6* 8.7* 8.7* 8.8*  MG 2.9*  --   --   --   --   --    GFR: Estimated Creatinine Clearance: 119 mL/min (by C-G formula based on SCr of 0.72 mg/dL). Liver Function Tests: Recent Labs  Lab 10/22/20 0326  AST 37  ALT 79*  ALKPHOS 116  BILITOT 0.8  PROT 5.6*  ALBUMIN 2.5*   No results for input(s): LIPASE, AMYLASE in the last 168 hours. No results for input(s): AMMONIA in the last 168 hours. Coagulation Profile: Recent Labs  Lab 10/25/20 1041  INR 1.1   Cardiac Enzymes: No results for input(s): CKTOTAL, CKMB, CKMBINDEX, TROPONINI in the last 168  hours. BNP (last 3 results) No results for input(s): PROBNP in the last 8760 hours. HbA1C: No results for input(s): HGBA1C in the last 72 hours. CBG: Recent Labs  Lab 10/24/20 1846 10/24/20 2108 10/25/20 0037 10/25/20 0305 10/25/20 1317  GLUCAP 107* 140* 113* 119* 121*   Lipid Profile: No results for input(s): CHOL, HDL, LDLCALC, TRIG, CHOLHDL,  LDLDIRECT in the last 72 hours. Thyroid Function Tests: No results for input(s): TSH, T4TOTAL, FREET4, T3FREE, THYROIDAB in the last 72 hours. Anemia Panel: No results for input(s): VITAMINB12, FOLATE, FERRITIN, TIBC, IRON, RETICCTPCT in the last 72 hours. Urine analysis:    Component Value Date/Time   COLORURINE YELLOW 10/06/2020 1042   APPEARANCEUR HAZY (A) 10/06/2020 1042   LABSPEC >1.030 (H) 10/06/2020 1042   PHURINE 5.5 10/06/2020 1042   GLUCOSEU NEGATIVE 10/06/2020 1042   HGBUR LARGE (A) 10/06/2020 1042   BILIRUBINUR NEGATIVE 10/06/2020 1042   KETONESUR NEGATIVE 10/06/2020 1042   PROTEINUR 30 (A) 10/06/2020 1042   NITRITE NEGATIVE 10/06/2020 1042   LEUKOCYTESUR NEGATIVE 10/06/2020 1042   Sepsis Labs: '@LABRCNTIP' (procalcitonin:4,lacticidven:4)  ) Recent Results (from the past 240 hour(s))  Culture, Respiratory w Gram Stain     Status: None   Collection Time: 10/16/20 10:56 AM   Specimen: Tracheal Aspirate; Respiratory  Result Value Ref Range Status   Specimen Description TRACHEAL ASPIRATE  Final   Special Requests NONE  Final   Gram Stain   Final    RARE SQUAMOUS EPITHELIAL CELLS PRESENT MODERATE WBC PRESENT, PREDOMINANTLY PMN FEW YEAST FEW GRAM POSITIVE COCCI    Culture   Final    MODERATE Normal respiratory flora-no Staph aureus or Pseudomonas seen Performed at [facility], [place] [address] [place], [place] [address]    Report Status 10/18/2020 FINAL  Final      Radiology Studies: CT HEAD WO CONTRAST  Result Date: 10/24/2020 CLINICAL DATA:  Acute neuro deficit. Unequal pupils. Double vision. History  of intracranial hemorrhage in shunt EXAM: CT HEAD WITHOUT CONTRAST TECHNIQUE: Contiguous axial images were obtained from the base of the skull through the vertex without intravenous contrast. COMPARISON:  CT head 10/17/2020.  MRI head 10/06/2020 FINDINGS: Brain: Resolving hemorrhage and edema along the right frontal ventricular shunt tract. Ventricle size normal and stable. CSF density cavity in the left posterior midbrain at the site of prior hemorrhage. This is stable from the prior CT. No new area of hemorrhage or infarction compared with recent studies. Vascular: Negative for hyperdense vessel Skull: Right parietal burr hole over the convexity. No acute skeletal abnormality. Sinuses/Orbits: Paranasal sinuses clear. Bilateral mastoid effusion. Negative orbit. NG tube in place. Other: None IMPRESSION: Ventricle size remains normal. Improving hemorrhage and edema along the shunt tract in the right frontal lobe. Stable cystic encephalomalacia left posterior midbrain at site of prior hemorrhage. No new area of hemorrhage or infarct compared with recent studies. Electronically Signed   By: Franchot Gallo M.D.   On: 10/24/2020 20:01     Scheduled Meds: . amantadine  200 mg Per Tube BID  . chlorhexidine gluconate (MEDLINE KIT)  15 mL Mouth Rinse BID  . Chlorhexidine Gluconate Cloth  6 each Topical Daily  . diltiazem  30 mg Per Tube Q6H  . doxazosin  2 mg Per Tube Daily  . feeding supplement  237 mL Oral BID BM  . feeding supplement (PROSource TF)  45 mL Per Tube TID  . feeding supplement (VITAL 1.5 CAL)  960 mL Per Tube Q24H  . free water  200 mL Per Tube Q4H  . insulin aspart  0-20 Units Subcutaneous Q4H  . insulin glargine  6 Units Subcutaneous Daily  . mouth rinse  15 mL Mouth Rinse 10 times per day  . ondansetron (ZOFRAN) IV  4 mg Intravenous Q6H  . sodium chloride flush  10-40 mL Intracatheter Q12H   Continuous Infusions: .  sodium chloride Stopped (10/19/20 1506)     LOS: 30 days   Time  Spent in minutes   45 minutes  Alantis Bethune D.O. on 10/25/2020 at 3:41 PM  Between 7am to 7pm - Please see pager noted on [email or web address]  After 7pm go to [email or web address]  And look for the night coverage person covering for me after hours  [facility] Office  [phone]

## 2020-10-25 NOTE — Progress Notes (Signed)
Contact precautions removed, per Vella Kohler RN from infection prevention, the patient has no indications for this isolation.

## 2020-10-25 NOTE — Progress Notes (Signed)
STROKE TEAM PROGRESS NOTE   INTERVAL HISTORY No acute events.  Patient had a CT scan of the head done last night because of asymmetric pupils and some subjective complaints of diplopia.  CT scan showed expected evolutionary changes and improvement in intracerebral hemorrhage and no acute abnormality.  Patient continues to participate well with therapy.  Continues to have diminished oral intake and primary team is thinking about PEG tube Vital signs are stable.  Neurological exam is unchanged.  Patient's wife is present at the bedside. Neurological exam is stable.  Vital signs unchanged Wife at bedside. Patient's neurologic status, prognosis and plan of care reviewed. Questions answered.  Vitals:   10/24/20 2039 10/24/20 2103 10/25/20 0014 10/25/20 0322  BP:  109/73 114/67 126/63  Pulse:  86 81 73  Resp:  20 20 19   Temp:  98.3 F (36.8 C) 98.2 F (36.8 C) 99 F (37.2 C)  TempSrc:  Axillary Axillary Axillary  SpO2: 97% 99% 98% 97%  Weight:      Height:       CBC:  Recent Labs  Lab 10/23/20 0751 10/25/20 0212  WBC 8.4 8.3  NEUTROABS 5.8 5.3  HGB 9.2* 9.0*  HCT 29.6* 28.7*  MCV 102.4* 102.1*  PLT 175 181   Basic Metabolic Panel:  Recent Labs  Lab 10/19/20 2242 10/20/20 0427 10/23/20 0751 10/25/20 0212  NA 145   < > 140 133*  K 4.1   < > 4.4 4.5  CL 107   < > 107 101  CO2 29   < > 28 23  GLUCOSE 120*   < > 153* 119*  BUN 46*   < > 22 20  CREATININE 1.04   < > 0.82 0.72  CALCIUM 8.5*   < > 8.7* 8.8*  MG 2.9*  --   --   --    < > = values in this interval not displayed.    IMAGING  CT head 4/6 1. Ventricle size shows mild interval enlargement since yesterday. No significant hydrocephalus. Intraventricular hemorrhage stable 2. Right frontal ventricular drain unchanged. Hemorrhage surrounding the drain unchanged. 3. Subarachnoid hemorrhage unchanged. Hemorrhage in the left tectum unchanged. No new hemorrhage identified.  CT head 4/4 1. Interval decrease in  intraventricular hemorrhage with decreased size of the lateral ventricles as compared to prior, right greater than left. Right parietal approach ventriculostomy in stable position with tip near the septum pellucidum. 2. Stable small volume subarachnoid and intraparenchymal hemorrhage along the catheter tract. 3. Stable 1.9 cm hemorrhage at the left dorsal pons. 4. No other new acute intracranial abnormality. 09/29/2020  CT head 4/3 IMPRESSION: 1. New intraventricular hemorrhage in the third and lateral ventricles. Fourth ventricular and midbrain hemorrhage is unchanged. 2. New right frontal EVD which traverses the right lateral ventricle. Blood clot encompasses the lower catheter and ventriculomegaly is similar to prior. 3. Small cortical infarcts are newly seen along the superior left frontal convexity.  09/26/2020 IR Angio IMPRESSION: 1. No evidence of an AVM, dural arteriovenous fistula, aneurysm or other vascular abnormality to explain patient's brainstem hemorrhage.  09/26/2020 CT head:  IMPRESSION: 1. Unchanged left brainstem hemorrhage extending into the fourth ventricle. 2. Worsening hydrocephalus.  09/25/2020 MRI brain w/wo, MR Venogram IMPRESSION: 1. Acute hemorrhage centered within the left dorsal midbrain extending into the dorsal pons and inferomedial thalamus with intraventricular extension. No abnormal enhancement to suggest underlying lesion. 2. Compression of the cerebral aqueduct with possible mild obstructive hydrocephalus. 3. No evidence of dural sinus thrombosis.  Attenuation of the left basal vein in the quadrigeminal cistern without definite occlusion.  09/25/2020 CT Angio head/neck IMPRESSION: 1. No vascular malformation, anomaly or aneurysm. 2. No emergent large vessel occlusion or high-grade stenosis of the intracranial arteries. 3. Unchanged size of dorsal left pontine hemorrhage with intraventricular extension.  09/25/2020 CT head code  stroke IMPRESSION: Acute intraparenchymal hemorrhage in the dorsal left pons with subarachnoid extension into the cerebral aqueduct and fourth Ventricle.  EEG-LTM 4/5-4/6  This study showed periodic discharges with triphasic morphology at 1.5 to 2.5 Hz which is on the ictal-interictal continuum with low to intermediate potential for seizures.  Additionally there is evidence of moderate to severe diffuse encephalopathy, nonspecific etiology.  No seizures were seen during the study  EEG adult 10/13/2020 This study is suggestive of moderate diffuse encephalopathy, nonspecific etiology. No seizures or definite epileptiform discharges were seen throughout the recording. [name]   CT HEAD WO CONTRAST  Result Date: 10/17/2020 CLINICAL DATA:  Follow-up intracranial hemorrhage EXAM: CT HEAD WITHOUT CONTRAST TECHNIQUE: Contiguous axial images were obtained from the base of the skull through the vertex without intravenous contrast. COMPARISON:  10/11/2020 FINDINGS: Brain: Intraparenchymal hemorrhage in the left side the upper pons and midbrain no longer shows any hyperdense components. There is an intra-axial cystic space measuring up to 15 mm in diameter, which may communicate with the fourth ventricle. No focal cerebellar abnormality is seen. Continuing diminishing density of blood along the ventriculostomy track in the right frontal region. Persistent brain edema surrounding that. Small amount of subarachnoid blood possibly subdural blood the ventriculostomy entry site appears similar. No evidence of any new or increased bleeding. Small cortical infarctions of the left frontoparietal vertex are barely appreciable. No evidence of new brain infarction. Ventricular size remains stable without evidence of ongoing aqueductal obstruction. Previously seen hyperdense blood dependent within the occipital horns is diminishing. Vascular: No vascular finding. Skull: Normal other than the right frontal burr hole.  Sinuses/Orbits: Paranasal sinuses are clear. Orbits are normal. No fluid in the middle ears. Small mastoid effusions. Other: None IMPRESSION: 1. Continuing diminishing density of blood along the ventriculostomy track in the right frontal region. No evidence of any new or increased bleeding. Diminishing dependent intraventricular blood in the lateral ventricles. Very small amount of subarachnoid blood and possibly subdural blood at the ventriculostomy entry site appears similar. 2. Small cortical infarctions of the left frontoparietal vertex are barely appreciable. No evidence of new or increased infarction. 3. Ventricular size remains stable without evidence of ongoing aqueductal obstruction. 4. 15 mm intra-axial cystic space in the left side of the upper pons and midbrain at the site of the previous hematoma. This may communicate with the fourth ventricle. Electronically Signed   By: Paulina Fusi M.D.   On: 10/17/2020 07:35   CT HEAD WO CONTRAST  Result Date: 10/17/2020 CLINICAL DATA:  Follow-up intracranial hemorrhage EXAM: CT HEAD WITHOUT CONTRAST TECHNIQUE: Contiguous axial images were obtained from the base of the skull through the vertex without intravenous contrast. COMPARISON:  10/11/2020 FINDINGS: Brain: Intraparenchymal hemorrhage in the left side the upper pons and midbrain no longer shows any hyperdense components. There is an intra-axial cystic space measuring up to 15 mm in diameter, which may communicate with the fourth ventricle. No focal cerebellar abnormality is seen. Continuing diminishing density of blood along the ventriculostomy track in the right frontal region. Persistent brain edema surrounding that. Small amount of subarachnoid blood possibly subdural blood the ventriculostomy entry site appears similar. No evidence of  any new or increased bleeding. Small cortical infarctions of the left frontoparietal vertex are barely appreciable. No evidence of new brain infarction. Ventricular size  remains stable without evidence of ongoing aqueductal obstruction. Previously seen hyperdense blood dependent within the occipital horns is diminishing. Vascular: No vascular finding. Skull: Normal other than the right frontal burr hole. Sinuses/Orbits: Paranasal sinuses are clear. Orbits are normal. No fluid in the middle ears. Small mastoid effusions. Other: None IMPRESSION: 1. Continuing diminishing density of blood along the ventriculostomy track in the right frontal region. No evidence of any new or increased bleeding. Diminishing dependent intraventricular blood in the lateral ventricles. Very small amount of subarachnoid blood and possibly subdural blood at the ventriculostomy entry site appears similar. 2. Small cortical infarctions of the left frontoparietal vertex are barely appreciable. No evidence of new or increased infarction. 3. Ventricular size remains stable without evidence of ongoing aqueductal obstruction. 4. 15 mm intra-axial cystic space in the left side of the upper pons and midbrain at the site of the previous hematoma. This may communicate with the fourth ventricle. Electronically Signed   By: Paulina Fusi M.D.   On: 10/17/2020 07:35   CT CHEST W CONTRAST  Result Date: 10/17/2020 CLINICAL DATA:  Fever of unknown origin. EXAM: CT CHEST, ABDOMEN, AND PELVIS WITH CONTRAST TECHNIQUE: Multidetector CT imaging of the chest, abdomen and pelvis was performed following the standard protocol during bolus administration of intravenous contrast. CONTRAST:  OMNIPAQUE IOHEXOL 300 MG/ML  SOLN COMPARISON:  Plain film 10/17/2020.  No prior CTs. FINDINGS: CT CHEST FINDINGS Cardiovascular: Aortic atherosclerosis. Normal heart size, without pericardial effusion. Bilateral pulmonary emboli are identified on this non dedicated exam. Example in the central right pulmonary artery on 28/3. Branch pulmonary arteries to both upper and lower lobes on 32/3. Within the left upper lobe pulmonary artery branch on  20/3. Mediastinum/Nodes: No mediastinal or hilar adenopathy. Lungs/Pleura: No pleural fluid. Endotracheal tube terminates appropriately. Areas of mild peripheral predominant airspace and ground-glass are indeterminate. Example at the left apex on 22/5 and right lower lobe on 58/5. More dependent bibasilar airspace opacities are likely due to atelectasis. Musculoskeletal: No acute osseous abnormality. CT ABDOMEN PELVIS FINDINGS Hepatobiliary: Mild hepatomegaly at 18.4 cm craniocaudal. Normal gallbladder, without biliary ductal dilatation. Pancreas: Normal, without mass or ductal dilatation. Spleen: Normal in size, without focal abnormality. Adrenals/Urinary Tract: Normal adrenal glands. Bilateral renal sinus cysts, without hydronephrosis. Foley catheter within the urinary bladder. Stomach/Bowel: Nasogastric tube terminating at the gastric antrum. Rectal catheter in place. Scattered colonic diverticula. Normal terminal ileum and appendix. Normal small bowel. Vascular/Lymphatic: Aortic atherosclerosis. IVC filter is appropriately positioned. No abdominopelvic adenopathy. Reproductive: Mild prostatomegaly. Other: No significant free fluid. No free intraperitoneal air. Mild pelvic anasarca. tiny bilateral fat containing inguinal hernias. Musculoskeletal: Degenerative partial fusion of bilateral sacroiliac joints in. Degenerate disc disease at the lumbosacral junction. IMPRESSION: 1. Bilateral, large volume right greater than left pulmonary emboli on this nondedicated exam. No evidence of right heart strain. 2. Mild peripheral ground-glass opacities, favoring atelectasis or infarct. Atypical infection felt less likely. 3. Otherwise, no explanation for fever. 4.  Aortic Atherosclerosis (ICD10-I70.0). 5. Prostatomegaly. These results will be called to the ordering clinician or representative by the Radiologist Assistant, and communication documented in the PACS or [employer]. Electronically Signed   By: Jeronimo Greaves  M.D.   On: 10/17/2020 14:43   CT ABDOMEN PELVIS W CONTRAST  Result Date: 10/17/2020 CLINICAL DATA:  Fever of unknown origin. EXAM: CT CHEST, ABDOMEN, AND PELVIS  WITH CONTRAST TECHNIQUE: Multidetector CT imaging of the chest, abdomen and pelvis was performed following the standard protocol during bolus administration of intravenous contrast. CONTRAST:  OMNIPAQUE IOHEXOL 300 MG/ML  SOLN COMPARISON:  Plain film 10/17/2020.  No prior CTs. FINDINGS: CT CHEST FINDINGS Cardiovascular: Aortic atherosclerosis. Normal heart size, without pericardial effusion. Bilateral pulmonary emboli are identified on this non dedicated exam. Example in the central right pulmonary artery on 28/3. Branch pulmonary arteries to both upper and lower lobes on 32/3. Within the left upper lobe pulmonary artery branch on 20/3. Mediastinum/Nodes: No mediastinal or hilar adenopathy. Lungs/Pleura: No pleural fluid. Endotracheal tube terminates appropriately. Areas of mild peripheral predominant airspace and ground-glass are indeterminate. Example at the left apex on 22/5 and right lower lobe on 58/5. More dependent bibasilar airspace opacities are likely due to atelectasis. Musculoskeletal: No acute osseous abnormality. CT ABDOMEN PELVIS FINDINGS Hepatobiliary: Mild hepatomegaly at 18.4 cm craniocaudal. Normal gallbladder, without biliary ductal dilatation. Pancreas: Normal, without mass or ductal dilatation. Spleen: Normal in size, without focal abnormality. Adrenals/Urinary Tract: Normal adrenal glands. Bilateral renal sinus cysts, without hydronephrosis. Foley catheter within the urinary bladder. Stomach/Bowel: Nasogastric tube terminating at the gastric antrum. Rectal catheter in place. Scattered colonic diverticula. Normal terminal ileum and appendix. Normal small bowel. Vascular/Lymphatic: Aortic atherosclerosis. IVC filter is appropriately positioned. No abdominopelvic adenopathy. Reproductive: Mild prostatomegaly. Other: No  significant free fluid. No free intraperitoneal air. Mild pelvic anasarca. tiny bilateral fat containing inguinal hernias. Musculoskeletal: Degenerative partial fusion of bilateral sacroiliac joints in. Degenerate disc disease at the lumbosacral junction. IMPRESSION: 1. Bilateral, large volume right greater than left pulmonary emboli on this nondedicated exam. No evidence of right heart strain. 2. Mild peripheral ground-glass opacities, favoring atelectasis or infarct. Atypical infection felt less likely. 3. Otherwise, no explanation for fever. 4.  Aortic Atherosclerosis (ICD10-I70.0). 5. Prostatomegaly. These results will be called to the ordering clinician or representative by the Radiologist Assistant, and communication documented in the PACS or [employer]. Electronically Signed   By: Jeronimo Greaves M.D.   On: 10/17/2020 14:43     PHYSICAL EXAM  Temp:  [98.2 F (36.8 C)-99 F (37.2 C)] 99 F (37.2 C) (04/27 0322) Pulse Rate:  [73-86] 73 (04/27 0322) Resp:  [18-27] 19 (04/27 0322) BP: (109-126)/(63-73) 126/63 (04/27 0322) SpO2:  [96 %-99 %] 97 % (04/27 0322) FiO2 (%):  [28 %] 28 % (04/26 2039)  General - Well nourished, well developed middle-aged Caucasian male, on trach collar  Ophthalmologic - fundi not visualized due to noncooperation.  Cardiovascular - Regular rate and rhythm, not in afib.  Neurological Exam- on trach collar now, awake alert and interactive.  Still has copious secretions; he tries to speak.[name]  He does follow commands consistently both midline and appendicular.  He is able to say a few words at times they are difficult to understand.  Still has vertical gaze palsy but able to look horizontally.  Right upper extremity 3/5 and left upper and lower extremity 4 -/5.  Right lower extremity 2/5.   ASSESSMENT/PLAN Mr. ADAL SERENO is a 65 y.o. male with history of atrial flutter newly diagnosed with atrial fibrillation in February 2022 not on anticoagulation, follows  with Dr. Johney Frame in cardiology.  He presented with right-sided weakness, slurred speech, headache, and vomiting.Last known well when he went to bed at 10 PM on 09/24/2020 and woke up at around 2 AM on the day of admission with slurred speech.  EMS was called.He was very drowsy upon their  assessment and notable right side weakeness.  He had worsening mental status, GCS 5, so was emergently intubated in ED for airway protection.  His MRI brain showed acute hemorrhage at left midbrain extending into pons with IVH associated with obstructive hydrocephalus.    ICH - Left Dorsalmidbrain ICHextending into dorsal pons and inferior thalamus with mild mass-effect on third ventricle and hydrocephalus,source unknown, suspect occult cavernoma  Code Stroke CT head-Acute IPH in the dorsal left pons with subarachnoid extension into the cerebral aqueduct and fourth ventricle.  CTA head & neck-No vascular malformation, anomaly or aneurysm. No emergent large vessel occlusion or high-grade stenosis. Unchanged size of dorsal left pontine hemorrhage with intraventricular extension.  MRI& MR Venogram-Acute hemorrhage centered within the left dorsal midbrain extending into the dorsal pons and inferomedial thalamus with intraventricular extension. Compression of the cerebral aqueduct with possible mild obstructive hydrocephalus. No evidence of dural sinus thrombosis.   CT Head W/O Contrast 3/29 0250 -Unchanged left brainstem hemorrhage extending into the fourth ventricle. Worsening hydrocephalus.  Diagnostic Cerebral Angiogram - No evidence of an aneurysm, AVM, dural AV fistula or other vascular abnormality to explain patient's known intraparenchymal hemorrhage.  MRI brain 4/8 -stable IVH, midbrain ICH and right frontal EVD tract hemorrhage.  Ventricular size stable.  2-3 punctate infarcts left frontal and parietal lobes, corresponding to small acute/subacute infarcts.  CT head 4/13 stable IVH, ICH, decreased  ventricle size  CT repeat 4/19 continue evolution of ICH and IVH, nearly absorbed now  2D Echo- EF:55-60%. No wall motion abnormalities.  LDL- 88  HgbA1c5.7  VTE prophylaxis - subcu heparin  No antithromboticprior to admission,  Initially on heparin IV.  Now on  Eliquis.  Therapy recommendations:CIR   Disposition:Pending CIR bed availability  Obstructive hydrocephalus   EVD placed on 3/30   CT 4/1 showed clot in right lateral ventricle around catheter.   S/p 1mg  (1cc) alteplase placed in EVD 09/29/20 -> drain well ->ICP measuring 4-11 with a good waveform  CT head (4/3) resolution of most of the IVH and improved hydrocephalus.  EVD stopped working 4/2 overnight. 0.5mg  alteplase placed in EVD 10/01/2020 -> Post alteplase CT head new hemorrhage along the EVD tract an in the right lateral ventricle.  CT repeat 4/4 - decreased IVH and decompressed right lateral ventricle  CT repeat 4/5 -mild interval enlargement of ventricular size, no significant hydrocephalus.  IVH stable.  HCT 4/7 stable, with no appreciable change in overall ventricular size  HCT 4/13 decreased ventricular size  CT repeat 4/19 continue evolution of ICH and IVH, nearly absorbed now  Acute Respiratory Failure  Required intubation on 4/4 due to worsening MS and hypoxia  S/p trach 10/04/20  Off sedation  On trach collar with improving secretions -> speaking valve  Bilateral DVT Bilateral, large volume right greater than left PE  LE venous Doppler 4/10 with bilateral DVTs  Status post IVC filter  CT chest showed bilateral PE, which I believe related to her DVT found on 4/10, not after IVC filter  On Eliquis  Aspiration Pneumonia Community-acquired pneumonia with Moraxella Fever   Tmax 100.6->100.4->101.5->101.7->afebrile->100.1  Sputum culture (3/30): Moraxella  Sputum culture 4/5: neg  WBC 9.8->8.5->8.7->8.9->10.6->12.2->13.0->10.7  CXR 4/7, 4/8 left basilar opacities  CXR 4/19  unremarkable  Blood culture 4/8 positive for Acinetobacter  Off cefepime and vanco -> unasyn (off 4/23)  ID on board - CT abd/pelvis/chest no infection source found  SIRPIDs, resolved Severe encephalopathy, improving  Left UE intermittent jerking with stimulation  LTM EEG: GPEDs with  triphasic wave, generalized SIRPIDs, although focal seizure is in DDx  S/p keppra 10/03/20 -> d/c concerning for angioedema -> on vimpat 10/04/20-> off vimpat 10/11/20  EEG 4/4 moderate to severe diffuse encephalopathy   Repeat spot EEG 4/15 moderate diffuse encephalopathy  Angioedema, resolved  Upper lip edema, much improved  Concerning for keppra allergy  Hypertension  Home meds:Diltiazem  Currently on Diltiazem 30 mg q6  off clonidine  BP goal Systolic < 160  Long-term BP goal normotensive  Hx of Aflutter PAF  Not on [facility] before admission  S/p CTI ablation for A flutter  off ASA  Not AC candidate at this time.  Afib with RVR intermittent -> sinus now  On Eliquis   Hyperlipidemia  Home meds:none  LDL88, goal < 70  AST/ALT 56/126->61/136  No statin now due to elevated LFTs  May consider to addstatin on discharge if LFT normalized  Dysphagia  Secondary to hemorrhagic stroke  Passed swallow  On Dys 1 and thin liquid  SLP on board  Tube feeds @ 69ml/hr for nocturnal feeding  Encourage po intake  Other Stroke Risk Factors  Advanced Age >/= 104  Snoring: sleep study denied by insurance prior to admission   Other Active Problems:  Hypokalemia  AKI Cre 0.95->1.14->1.38->1.25, on TF  Lethargic - on amantadine   Wound care following  Hospital day #30 I Patient is progressing well and is ready to move to inpatient rehab but no bed is available as per my discussion with rehab coordinator today as well.D/w Dr Catha Gosselin..  Long discussion with wife at the bedside and answered questions.  Greater than 50% time during this 25-minute visit was spent in  counseling and coordination of care and discussion with care team.  Delia Heady MD     10/25/2020 8:10 AM

## 2020-10-25 NOTE — Progress Notes (Signed)
Nutrition Follow-up  DOCUMENTATION CODES:   Not applicable  INTERVENTION:  Given continued inadequate oral intake, recommend proceed with placement of PEG. Once PEG is placed and ready for use, recommend continue with current nutrition plan of care to allow for po intake during waking hours as pt continues to work on speech/swallow function with SLP while still meeting majority of pt's needs overnight.   Continue: - Vital 1.5 @ 80 ml/hr to run over 12 hours from 1800 to 0600 (total of 960 ml) - ProSource TF 45 ml TID - Free water flushes per MD, currently 200 ml q 4 hours  Nocturnal tube feeding regimen provides 1560 kcal, 98 grams of protein, and 733 ml of H2O (meets 65% of kcal needs and 85% of protein needs). Total free water with flushes: 1933 ml  - Ensure Enlive po BID - Magic Cup TID with meals   NUTRITION DIAGNOSIS:   Inadequate oral intake related to inability to eat as evidenced by NPO status.  Progressing, pt now on dysphagia 1 diet with thin liquids   GOAL:   Patient will meet greater than or equal to 90% of their needs  Progressing, addressing via TF and supplements  MONITOR:   PO intake,Supplement acceptance,TF tolerance,Weight trends  REASON FOR ASSESSMENT:   Consult,Ventilator Enteral/tube feeding initiation and management  ASSESSMENT:   Pt with PMH of afib not on anticoagulation s/p ablation 9/21, s/p cardioversion 2015, retinal detachment s/p surgery, COVID s/p monoclonal antibody treatment 7/21, and CV admitted with right-sided weakness and ICH.  3/29 - EVD placement  3/30 - extubated; cortrak placed (tip gastric) 4/04 - re-intubated 4/06 - trach placed 4/08 - EVD removed 4/20 - off vent 4/21 - pt removed Cortrak 4/22 - MBS, diet advanced to dysphagia 1 with thin liquids, Cortrak replaced (tip gastric)  Pt remains on TC.  Discussed pt with RN.  Pt pending discharge to [facility].   RD went to follow-up on Day 2 of calorie count, but found only 1  meal had been documented. Pt ate only 25% of his breakfast tray yesterday. Other intake unknown at this time. Noted primary team has discussed PEG placement with pt's wife who was agreeable yesterday. RD would agree with recommendation for PEG placement as pt continues to have inadequate PO intake to meet needs. Current nutrition plan of care to be continued before and after PEG is placed and ready for use. RD will make adjustments to TF regimen based on PO intake as pt continues to work on swallowing ability/cognition with SLP.   Current TF: Vital 1.5 @ 67ml/hr for 12 hours from 1800-0600 with 15ml Prosource TF TID and free water Q4H  Admit weight: 107.7 kg Current weight: 105.1 kg  UOP: x24 hours  Medications: Ensure po BID, SSI Q4H, lantus 6 units daily, zofran Labs: Na 133 (L) CBGs 119-121-87  Diet Order:   Diet Order            DIET - DYS 1 Room service appropriate? No; Fluid consistency: Thin  Diet effective now                 EDUCATION NEEDS:   No education needs have been identified at this time  Skin:  Skin Assessment: Skin Integrity Issues: Skin Integrity Issues:: Stage I Stage I: L head Incisions: neck Other: MASD to rectum  Last BM:  10/25/20 type 4  Height:   Ht Readings from Last 1 Encounters:  09/25/20 6\' 2"  (1.88 m)  Weight:   Wt Readings from Last 1 Encounters:  10/24/20 105.1 kg    Ideal Body Weight:  86.3 kg  BMI:  Body mass index is 29.75 kg/m.  Estimated Nutritional Needs:   Kcal:  2400-2600  Protein:  115-130 grams  Fluid:  >2 L/day    Eugene Gavia, MS, RD, LDN RD pager number and weekend/on-call pager number located in Amion.

## 2020-10-25 NOTE — H&P (Signed)
Physical Medicine and Rehabilitation Admission H&P    Chief Complaint  Patient presents with  . Code Stroke: Slurred Speech/Right Weakness  : HPI: Walter [name] is a 65 year old right-handed male history of atrial fibrillation with ablation not on anticoagulation followed by Dr. Johney Frame.  Patient lives with spouse independent prior to admission working as a Veterinary surgeon.  Presented 09/17/2020 with right side weakness headache and dysarthria as well as vomiting.    Cranial CT/MRI showed acute intraparenchymal hemorrhage in dorsal left pons with subarachnoid extension into the cerebral aqueduct and fourth ventricle.  CT angiogram head and neck no vascular malformation, anomaly or aneurysm.  Echocardiogram with ejection fraction of 55 to 60% grade 1 diastolic dysfunction no regional wall motion abnormalities.  Admission chemistries unremarkable aside glucose 169 alcohol negative urine drug screen negative.  Patient did require emergent intubation for airway protection.  Hospital course complicated by hydrocephalus with diagnostic cerebral angiogram completed showing no evidence of aneurysm.  EVD was placed per neurosurgery.  Maintained on 3% hypertonic saline.  Patient was extubated 09/28/2020.  Follow-up cardiology services for atrial fibrillation maintained on Cardizem.  Patient was cleared to begin Lovenox for DVT prophylaxis 09/28/2020.  Repeat head CT 09/29/2020 showed new intraventricular hemorrhage in the third and lateral ventricles.  Fourth ventricular and midbrain hemorrhage unchanged and again repeated 10/24/2020 showing improving hemorrhage and edema along the shunt tract in the right frontal lobe.  No new area of hemorrhage..  Patient with long-term ventilatory support undergone tracheostomy 10/04/2020 per Dr.Chand critical care services.  On 10/08/2020 patient found to have bilateral lower extremity DVTs and underwent placement of IVC filter placement per interventional radiology.  Dopplers again  completed 10/25/2020 showing acute DVT right common femoral vein and SF junction right femoral vein and right proximal profunda vein right popliteal vein and right posterior tibial vein right peroneal vein right soleal vein and right gastrocnemius vein it was established the patient had an IVC filter maintained and Eliquis was also initiated.  Patient with fever of unknown origin infectious disease consulted broad-spectrum antibiotics  initiated.  SARS coronavirus negative.  CT chest abdomen pelvis did show bilateral large volume right greater than left pulmonary emboli no evidence of right heart strain and it was discussed to continue IVC filter as well as Eliquis.  Interventional radiology consulted to discuss possible removal of IVC filter with CT venogram 10/27/2020 showed thrombus involving the IVC and iliac veins.  Large amount of thrombus involving the infrarenal IVC and IVC filter.  Suprarenal IVC was patent.  Thrombus in the right iliac veins and thrombus in the proximal femoral veins bilaterally and plan was to undergo thrombectomy and retrieval of IVC filter 10/27/2020 per Dr. Deanne Coffer of interventional radiology..  Patient initially n.p.o. with alternative means of nutritional support advanced to dysphagia #3 thin liquids and also discussion of possible need for PEG tube.  Therapy evaluations were completed due to patient decreased functional mobility was admitted for a comprehensive rehab program.  Review of Systems  Unable to perform ROS: Acuity of condition  Constitutional: Negative.   HENT: Negative.   Eyes: Positive for double vision.  Respiratory: Negative.   Cardiovascular: Negative.   Gastrointestinal: Negative.   Genitourinary: Negative.   Musculoskeletal: Negative.   Skin: Negative.   Neurological: Negative.   Endo/Heme/Allergies: Negative.   Psychiatric/Behavioral: Negative.    Past Medical History:  Diagnosis Date  . Atrial flutter (HCC)   . Dizzy 09/25/13  . Near syncope  09/25/13   Past Surgical  History:  Procedure Laterality Date  . A-FLUTTER ABLATION N/A 03/09/2020   Procedure: A-FLUTTER ABLATION;  Surgeon: Hillis Range, MD;  Location: [facility] INVASIVE CV LAB;  Service: Cardiovascular;  Laterality: N/A;  . CARDIOVERSION N/A 09/27/2013   Procedure: CARDIOVERSION;  Surgeon: Lewayne Bunting, MD;  Location: [facility] ENDOSCOPY;  Service: Cardiovascular;  Laterality: N/A;  . IR ANGIO EXTERNAL CAROTID SEL EXT CAROTID UNI R MOD SED  09/26/2020  . IR ANGIO INTRA EXTRACRAN SEL COM CAROTID INNOMINATE BILAT MOD SED  09/26/2020  . IR ANGIO VERTEBRAL SEL VERTEBRAL UNI R MOD SED  09/26/2020  . IR IVC FILTER PLMT / S&I /IMG GUID/MOD SED  10/08/2020  . IR US GUIDE VASC ACCESS RIGHT  09/26/2020  . RETINAL DETACHMENT SURGERY    . right knee arthroscopy    . TEE WITHOUT CARDIOVERSION N/A 09/27/2013   Procedure: TRANSESOPHAGEAL ECHOCARDIOGRAM (TEE);  Surgeon: Lewayne Bunting, MD;  Location: [facility] ENDOSCOPY;  Service: Cardiovascular;  Laterality: N/A;  . WRIST SURGERY     Family History  Problem Relation Age of Onset  . Heart disease Mother   . Heart disease Father   . Healthy Sister   . Healthy Brother    Social History:  reports that he has never smoked. He has never used smokeless tobacco. He reports current alcohol use. He reports that he does not use drugs. Allergies:  Allergies  Allergen Reactions  . Keppra [Levetiracetam] Swelling    Patient experienced angioedema post inpatient keppra dose  . Latex Itching    Skin turns real red   Medications Prior to Admission  Medication Sig Dispense Refill  . Bioflavonoid Products (ESTER-C) 500-550 MG TABS Take 1 tablet by mouth daily.    . cholecalciferol (VITAMIN D3) 25 MCG (1000 UNIT) tablet Take 1,000 Units by mouth daily.    [name] diltiazem (CARDIZEM CD) 120 MG 24 hr capsule Take 1 capsule (120 mg total) by mouth daily. 90 capsule 3  . EPINEPHrine 0.3 mg/0.3 mL IJ SOAJ injection Inject 0.3 mg into the muscle once as needed for  anaphylaxis.      Drug Regimen Review Drug regimen was reviewed and remains appropriate with no significant issues identified  Home: Home Living Family/patient expects to be discharged to:: Private residence Living Arrangements: Spouse/significant other Available Help at Discharge: Family,Available 24 hours/day Type of Home: House Home Access: Stairs to enter [employer] of Steps: 5 Entrance Stairs-Rails: Right,Left Home Layout: Two level,Able to live on main level with bedroom/bathroom Bathroom Shower/Tub: Health visitor: Standard Bathroom Accessibility: Yes Home Equipment: None Additional Comments: dog named ruby  Lives With: Spouse   Functional History: Prior Function Level of Independence: Independent 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  Functional Status:  Mobility: Bed Mobility Overal bed mobility: Needs Assistance Bed Mobility: Supine to Sit,Sit to Supine Rolling: +2 for physical assistance,Mod assist Sidelying to sit: Total assist,+2 for physical assistance Supine to sit: Max assist,+2 for physical assistance Sit to supine: Max assist,+2 for physical assistance Sit to sidelying: Max assist,+2 for physical assistance General bed mobility comments: pt needing assist to BLE, able to initiate reaching with LU E but needing assist to complete movement due to weakness, cueing for sequencing Transfers Overall transfer level: Needs assistance Equipment used: 2 person hand held assist Transfers: Sit to/from Stand Sit to Stand: +2 physical assistance,Mod assist Squat pivot transfers: Total assist,+2 physical assistance  Lateral/Scoot Transfers: Total assist,+2 physical assistance General transfer comment: pt performed partial sit>stand 2x with good initiation but inability to  achieve full, upright position. Mod A +2 given for power up and fwd translation, B feet blocked, no knee buckling noted. Ambulation/Gait Ambulation/Gait assistance: Max  assist,+2 physical assistance,+2 safety/equipment Gait Distance (Feet): 3 Feet Assistive device: 2 person hand held assist Gait Pattern/deviations: Step-to pattern,Decreased stride length,Decreased step length - right,Decreased stance time - right,Decreased weight shift to right,Leaning posteriorly General Gait Details: pt unable to achieve Gait velocity: decreased Gait velocity interpretation: <1.31 ft/sec, indicative of household ambulator    ADL: ADL Overall ADL's : Needs assistance/impaired Eating/Feeding: Maximal assistance Eating/Feeding Details (indicate cue type and reason): assisted with self feeding using R UE and max assist (pureed pineapple), will benefit from built up handles; HOH support x 4 bites with increased effort required Grooming: Wash/dry face,Minimal assistance,Sitting Grooming Details (indicate cue type and reason): to wipe mouth Upper Body Bathing: Moderate assistance,Sitting Lower Body Bathing: Moderate assistance,Bed level,Sit to/from stand Upper Body Dressing : Moderate assistance,Sitting Lower Body Dressing: Total assistance,+2 for physical assistance,+2 for safety/equipment,Sit to/from stand Lower Body Dressing Details (indicate cue type and reason): assist to don socks, mod +2 for partial stand Toilet Transfer: Moderate assistance,+2 for physical assistance Toilet Transfer Details (indicate cue type and reason): NT Toileting- Clothing Manipulation and Hygiene: Total assistance,+2 for physical assistance,+2 for safety/equipment,Bed level Toileting - Clothing Manipulation Details (indicate cue type and reason): peri care after flexiseal coming loose Functional mobility during ADLs: Maximal assistance,Moderate assistance,+2 for physical assistance,+2 for safety/equipment,Cueing for safety,Cueing for sequencing General ADL Comments: pt progressing well, remains limited by decreased activity tolerance, R weakness, cognition and  balance  Cognition: Cognition Overall Cognitive Status: Impaired/Different from baseline Arousal/Alertness: Awake/alert Orientation Level: Intubated/Tracheostomy - Unable to assess,Oriented to person,Oriented to situation Attention: Focused,Sustained,Selective Focused Attention: Appears intact Sustained Attention: Impaired Sustained Attention Impairment: Verbal basic,Functional basic Selective Attention: Impaired Selective Attention Impairment: Verbal basic,Functional basic Awareness: Impaired Awareness Impairment: Intellectual impairment,Emergent impairment Problem Solving: Impaired Problem Solving Impairment: Functional basic Cognition Arousal/Alertness: Awake/alert,Lethargic (initially lethargic, more alert with stimuli) Behavior During Therapy: Flat affect Overall Cognitive Status: Impaired/Different from baseline Area of Impairment: Orientation,Attention,Following commands,Safety/judgement,Awareness,Problem solving Orientation Level: Disoriented to,Time,Situation Current Attention Level: Sustained Following Commands: Follows one step commands with increased time,Follows one step commands consistently Safety/Judgement: Decreased awareness of safety,Decreased awareness of deficits Awareness: Intellectual Problem Solving: Slow processing,Decreased initiation,Difficulty sequencing,Requires verbal cues,Requires tactile cues General Comments: pt initially lethargic, but with improved alertness and command following through session. Pt ~80% command following with L extremities and simple, direct commands. able to follow commands with RUE/LE at times. [name] present during session and he was answering her questions with PMV placed. Difficult to assess due to: Tracheostomy (soft spoken)  Physical Exam: Blood pressure 126/63, pulse 73, temperature 99 F (37.2 C), temperature source Axillary, resp. rate 19, height 6\' 2"  (1.88 m), weight 105.1 kg, SpO2 97 %. Physical Exam Gen: no  distress, normal appearing HEENT: oral mucosa pink and moist, Tracheostomy tube in place Cardio: Reg rate Chest: normal effort, normal rate of breathing Abd: soft, non-distended Ext: no edema Psych: pleasant, normal affect Skin: intact    Comments: Patient is agitated and restless.  +NGT.  He does make eye contact with examiner follows some basic commands. 4/5 strength throughout   Results for orders placed or performed during the hospital encounter of 09/25/20 (from the past 48 hour(s))  CBC with Differential/Platelet     Status: Abnormal   Collection Time: 10/23/20  7:51 AM  Result Value Ref Range   WBC 8.4 4.0 - 10.5 K/uL  RBC 2.89 (L) 4.22 - 5.81 MIL/uL   Hemoglobin 9.2 (L) 13.0 - 17.0 g/dL   HCT 16.1 (L) 09.6 - 04.5 %   MCV 102.4 (H) 80.0 - 100.0 fL   MCH 31.8 26.0 - 34.0 pg   MCHC 31.1 30.0 - 36.0 g/dL   RDW 40.9 81.1 - 91.4 %   Platelets 175 150 - 400 K/uL   nRBC 0.0 0.0 - 0.2 %   Neutrophils Relative % 68 %   Neutro Abs 5.8 1.7 - 7.7 K/uL   Lymphocytes Relative 18 %   Lymphs Abs 1.5 0.7 - 4.0 K/uL   Monocytes Relative 4 %   Monocytes Absolute 0.3 0.1 - 1.0 K/uL   Eosinophils Relative 9 %   Eosinophils Absolute 0.7 (H) 0.0 - 0.5 K/uL   Basophils Relative 0 %   Basophils Absolute 0.0 0.0 - 0.1 K/uL   Immature Granulocytes 1 %   Abs Immature Granulocytes 0.04 0.00 - 0.07 K/uL    Comment: Performed at [facility], [address], [place], [place] [address]  Basic metabolic panel     Status: Abnormal   Collection Time: 10/23/20  7:51 AM  Result Value Ref Range   Sodium 140 135 - 145 mmol/L   Potassium 4.4 3.5 - 5.1 mmol/L   Chloride 107 98 - 111 mmol/L   CO2 28 22 - 32 mmol/L   Glucose, Bld 153 (H) 70 - 99 mg/dL    Comment: Glucose reference range applies only to samples taken after fasting for at least 8 hours.   BUN 22 8 - 23 mg/dL   Creatinine, Ser 6.21 0.61 - 1.24 mg/dL   Calcium 8.7 (L) 8.9 - 10.3 mg/dL   GFR, Estimated >30 >86 mL/min    Comment:  (NOTE) Calculated using the CKD-EPI Creatinine Equation (2021)    Anion gap 5 5 - 15    Comment: Performed at [facility], [address] [place], [place] [address]  Glucose, capillary     Status: Abnormal   Collection Time: 10/23/20  9:08 AM  Result Value Ref Range   Glucose-Capillary 133 (H) 70 - 99 mg/dL    Comment: Glucose reference range applies only to samples taken after fasting for at least 8 hours.  Glucose, capillary     Status: Abnormal   Collection Time: 10/23/20 12:24 PM  Result Value Ref Range   Glucose-Capillary 131 (H) 70 - 99 mg/dL    Comment: Glucose reference range applies only to samples taken after fasting for at least 8 hours.  Glucose, capillary     Status: Abnormal   Collection Time: 10/23/20  5:29 PM  Result Value Ref Range   Glucose-Capillary 114 (H) 70 - 99 mg/dL    Comment: Glucose reference range applies only to samples taken after fasting for at least 8 hours.  Glucose, capillary     Status: Abnormal   Collection Time: 10/23/20  8:54 PM  Result Value Ref Range   Glucose-Capillary 132 (H) 70 - 99 mg/dL    Comment: Glucose reference range applies only to samples taken after fasting for at least 8 hours.  Glucose, capillary     Status: Abnormal   Collection Time: 10/24/20 12:05 AM  Result Value Ref Range   Glucose-Capillary 115 (H) 70 - 99 mg/dL    Comment: Glucose reference range applies only to samples taken after fasting for at least 8 hours.  Glucose, capillary     Status: Abnormal   Collection  Time: 10/24/20  4:19 AM  Result Value Ref Range   Glucose-Capillary 153 (H) 70 - 99 mg/dL    Comment: Glucose reference range applies only to samples taken after fasting for at least 8 hours.  Glucose, capillary     Status: Abnormal   Collection Time: 10/24/20  7:57 AM  Result Value Ref Range   Glucose-Capillary 102 (H) 70 - 99 mg/dL    Comment: Glucose reference range applies only to samples taken after fasting for at least 8 hours.  Glucose,  capillary     Status: Abnormal   Collection Time: 10/24/20 12:47 PM  Result Value Ref Range   Glucose-Capillary 131 (H) 70 - 99 mg/dL    Comment: Glucose reference range applies only to samples taken after fasting for at least 8 hours.  Glucose, capillary     Status: None   Collection Time: 10/24/20  5:30 PM  Result Value Ref Range   Glucose-Capillary 95 70 - 99 mg/dL    Comment: Glucose reference range applies only to samples taken after fasting for at least 8 hours.  Glucose, capillary     Status: Abnormal   Collection Time: 10/24/20  6:46 PM  Result Value Ref Range   Glucose-Capillary 107 (H) 70 - 99 mg/dL    Comment: Glucose reference range applies only to samples taken after fasting for at least 8 hours.  Glucose, capillary     Status: Abnormal   Collection Time: 10/24/20  9:08 PM  Result Value Ref Range   Glucose-Capillary 140 (H) 70 - 99 mg/dL    Comment: Glucose reference range applies only to samples taken after fasting for at least 8 hours.   Comment 1 Notify RN    Comment 2 Document in Chart   Glucose, capillary     Status: Abnormal   Collection Time: 10/25/20 12:37 AM  Result Value Ref Range   Glucose-Capillary 113 (H) 70 - 99 mg/dL    Comment: Glucose reference range applies only to samples taken after fasting for at least 8 hours.  CBC with Differential/Platelet     Status: Abnormal   Collection Time: 10/25/20  2:12 AM  Result Value Ref Range   WBC 8.3 4.0 - 10.5 K/uL   RBC 2.81 (L) 4.22 - 5.81 MIL/uL   Hemoglobin 9.0 (L) 13.0 - 17.0 g/dL   HCT 67.3 (L) 41.9 - 37.9 %   MCV 102.1 (H) 80.0 - 100.0 fL   MCH 32.0 26.0 - 34.0 pg   MCHC 31.4 30.0 - 36.0 g/dL   RDW 02.4 09.7 - 35.3 %   Platelets 181 150 - 400 K/uL   nRBC 0.0 0.0 - 0.2 %   Neutrophils Relative % 63 %   Neutro Abs 5.3 1.7 - 7.7 K/uL   Lymphocytes Relative 21 %   Lymphs Abs 1.7 0.7 - 4.0 K/uL   Monocytes Relative 5 %   Monocytes Absolute 0.4 0.1 - 1.0 K/uL   Eosinophils Relative 10 %   Eosinophils  Absolute 0.8 (H) 0.0 - 0.5 K/uL   Basophils Relative 0 %   Basophils Absolute 0.0 0.0 - 0.1 K/uL   Immature Granulocytes 1 %   Abs Immature Granulocytes 0.05 0.00 - 0.07 K/uL    Comment: Performed at [facility], [address] [place], [place] [address]  Basic metabolic panel     Status: Abnormal   Collection Time: 10/25/20  2:12 AM  Result Value Ref Range   Sodium 133 (L) 135 - 145  mmol/L   Potassium 4.5 3.5 - 5.1 mmol/L   Chloride 101 98 - 111 mmol/L   CO2 23 22 - 32 mmol/L   Glucose, Bld 119 (H) 70 - 99 mg/dL    Comment: Glucose reference range applies only to samples taken after fasting for at least 8 hours.   BUN 20 8 - 23 mg/dL   Creatinine, Ser 2.95 0.61 - 1.24 mg/dL   Calcium 8.8 (L) 8.9 - 10.3 mg/dL   GFR, Estimated >62 >13 mL/min    Comment: (NOTE) Calculated using the CKD-EPI Creatinine Equation (2021)    Anion gap 9 5 - 15    Comment: Performed at [facility], [address], [place], [place] [address]  Glucose, capillary     Status: Abnormal   Collection Time: 10/25/20  3:05 AM  Result Value Ref Range   Glucose-Capillary 119 (H) 70 - 99 mg/dL    Comment: Glucose reference range applies only to samples taken after fasting for at least 8 hours.   Comment 1 Notify RN    Comment 2 Document in Chart    CT HEAD WO CONTRAST  Result Date: 10/24/2020 CLINICAL DATA:  Acute neuro deficit. Unequal pupils. Double vision. History of intracranial hemorrhage in shunt EXAM: CT HEAD WITHOUT CONTRAST TECHNIQUE: Contiguous axial images were obtained from the base of the skull through the vertex without intravenous contrast. COMPARISON:  CT head 10/17/2020.  MRI head 10/06/2020 FINDINGS: Brain: Resolving hemorrhage and edema along the right frontal ventricular shunt tract. Ventricle size normal and stable. CSF density cavity in the left posterior midbrain at the site of prior hemorrhage. This is stable from the prior CT. No new area of hemorrhage or infarction compared with  recent studies. Vascular: Negative for hyperdense vessel Skull: Right parietal burr hole over the convexity. No acute skeletal abnormality. Sinuses/Orbits: Paranasal sinuses clear. Bilateral mastoid effusion. Negative orbit. NG tube in place. Other: None IMPRESSION: Ventricle size remains normal. Improving hemorrhage and edema along the shunt tract in the right frontal lobe. Stable cystic encephalomalacia left posterior midbrain at site of prior hemorrhage. No new area of hemorrhage or infarct compared with recent studies. Electronically Signed   By: Marlan Palau M.D.   On: 10/24/2020 20:01       Medical Problem List and Plan: 1.  Right side weakness and slurred speech secondary to intracerebral hemorrhage complicated by hydrocephalus.  Status post EVD placed per neurosurgery  -patient may not shower  -ELOS/Goals: 3 weeks S  -Admit to CIR 2.  Antithrombotics: Acute PE/bilateral lower extremity DVT.  Status post IVC filter 10/08/2020 with thrombectomy and retrieval of IVC filter 10/28/2020 per interventional radiology -DVT/anticoagulation: Continue Eliquis  -antiplatelet therapy: N/A 3. Pain Management: Decrease oxycodone to q8H prn 4. Mood: Amantadine 200 mg twice daily  -antipsychotic agents: N/A 5. Neuropsych: This patient is not capable of making decisions on his own behalf. 6. Skin/Wound Care: Routine skin checks 7. Fluids/Electrolytes/Nutrition: Routine in and outs with follow-up chemistries 8.  PAF.  Status post ablation cardioversion in the Past.  Continue Eliquis.  Cardio exam 30 mg every 6 hours.  Cardiac rate controlled 9.  Acute hypoxic respiratory failure.  Tracheostomy 10/04/2020 per DrChand.  Presently with a #6 Shiley.  PMV as tolerated.  Follow-up speech therapy 10.  Hospital-acquired pneumonia/ventilator associated pneumonia.  Infectious disease follow-up antibiotic therapy completed 11.  Dysphagia.  Dysphagia #1 thin liquids.  Discussing plans for possible PEG tube. Will need  assistance with eating 12.  Hyperglycemia  related to tube feeds.  Lantus insulin 6 units daily.  Continue SSI.  Hemoglobin A1c was 5.7  4/30: CBGs 111-167, due to TF. Wean Lantus as off TF.   I have personally performed a face to face diagnostic evaluation, including, but not limited to relevant history and physical exam findings, of this patient and developed relevant assessment and plan.  Additionally, I have reviewed and concur with the physician assistant's documentation above.  Sula Soda, MD  Mcarthur Rossetti Angiulli, PA-C 10/25/2020

## 2020-10-25 NOTE — Progress Notes (Signed)
Inpatient Rehab Admissions Coordinator:   I have no beds available for this patient to admit to CIR today.  Will continue to follow for timing of potential admission pending bed availability.   Estill Dooms, PT, DPT Admissions Coordinator [phone] 10/25/20  11:27 AM

## 2020-10-26 ENCOUNTER — Inpatient Hospital Stay (HOSPITAL_COMMUNITY): Payer: Medicare Other

## 2020-10-26 LAB — GLUCOSE, CAPILLARY
Glucose-Capillary: 108 mg/dL — ABNORMAL HIGH (ref 70–99)
Glucose-Capillary: 120 mg/dL — ABNORMAL HIGH (ref 70–99)
Glucose-Capillary: 121 mg/dL — ABNORMAL HIGH (ref 70–99)
Glucose-Capillary: 127 mg/dL — ABNORMAL HIGH (ref 70–99)
Glucose-Capillary: 129 mg/dL — ABNORMAL HIGH (ref 70–99)
Glucose-Capillary: 150 mg/dL — ABNORMAL HIGH (ref 70–99)
Glucose-Capillary: 70 mg/dL (ref 70–99)
Glucose-Capillary: 94 mg/dL (ref 70–99)

## 2020-10-26 MED ORDER — IOHEXOL 350 MG/ML SOLN
100.0000 mL | Freq: Once | INTRAVENOUS | Status: AC | PRN
  Administered 2020-10-26: 100 mL via INTRAVENOUS

## 2020-10-26 NOTE — Progress Notes (Signed)
Interventional Radiology Brief Note:  CT Venogram performed this afternoon and reviewed by Dr. Archer Asa.  Will plan to proceed with image-guided thrombectomy, angioplasty vs. Stenting, and filter retrieval in IR tomorrow if patient agreeable and schedule permits.   Will plan to discuss with performing MD and patient tomorrow.  NPO p MN.  May remain on Eliquis.   Loyce Dys, MS RD PA-C

## 2020-10-26 NOTE — Progress Notes (Signed)
STROKE TEAM PROGRESS NOTE   INTERVAL HISTORY No acute events.  His wife is at the bedside.  Patient is awake and interactive today.  He does have insurance approval for transfer to inpatient rehab later today.  Vital signs are stable.  Neurological exam is unchanged.    Vitals:   10/26/20 0500 10/26/20 0526 10/26/20 0740 10/26/20 1107  BP:   126/70 120/72  Pulse:  90 80 88  Resp:  (!) 24 (!) 22 (!) 23  Temp:   97.8 F (36.6 C) 98.9 F (37.2 C)  TempSrc:   Axillary Oral  SpO2:  98% 96% 97%  Weight: 100.3 kg     Height:       CBC:  Recent Labs  Lab 10/23/20 0751 10/25/20 0212  WBC 8.4 8.3  NEUTROABS 5.8 5.3  HGB 9.2* 9.0*  HCT 29.6* 28.7*  MCV 102.4* 102.1*  PLT 175 181   Basic Metabolic Panel:  Recent Labs  Lab 10/19/20 2242 10/20/20 0427 10/23/20 0751 10/25/20 0212  NA 145   < > 140 133*  K 4.1   < > 4.4 4.5  CL 107   < > 107 101  CO2 29   < > 28 23  GLUCOSE 120*   < > 153* 119*  BUN 46*   < > 22 20  CREATININE 1.04   < > 0.82 0.72  CALCIUM 8.5*   < > 8.7* 8.8*  MG 2.9*  --   --   --    < > = values in this interval not displayed.    IMAGING  CT head 4/6 1. Ventricle size shows mild interval enlargement since yesterday. No significant hydrocephalus. Intraventricular hemorrhage stable 2. Right frontal ventricular drain unchanged. Hemorrhage surrounding the drain unchanged. 3. Subarachnoid hemorrhage unchanged. Hemorrhage in the left tectum unchanged. No new hemorrhage identified.  CT head 4/4 1. Interval decrease in intraventricular hemorrhage with decreased size of the lateral ventricles as compared to prior, right greater than left. Right parietal approach ventriculostomy in stable position with tip near the septum pellucidum. 2. Stable small volume subarachnoid and intraparenchymal hemorrhage along the catheter tract. 3. Stable 1.9 cm hemorrhage at the left dorsal pons. 4. No other new acute intracranial abnormality. 09/29/2020  CT head  4/3 IMPRESSION: 1. New intraventricular hemorrhage in the third and lateral ventricles. Fourth ventricular and midbrain hemorrhage is unchanged. 2. New right frontal EVD which traverses the right lateral ventricle. Blood clot encompasses the lower catheter and ventriculomegaly is similar to prior. 3. Small cortical infarcts are newly seen along the superior left frontal convexity.  09/26/2020 IR Angio IMPRESSION: 1. No evidence of an AVM, dural arteriovenous fistula, aneurysm or other vascular abnormality to explain patient's brainstem hemorrhage.  09/26/2020 CT head:  IMPRESSION: 1. Unchanged left brainstem hemorrhage extending into the fourth ventricle. 2. Worsening hydrocephalus.  09/25/2020 MRI brain w/wo, MR Venogram IMPRESSION: 1. Acute hemorrhage centered within the left dorsal midbrain extending into the dorsal pons and inferomedial thalamus with intraventricular extension. No abnormal enhancement to suggest underlying lesion. 2. Compression of the cerebral aqueduct with possible mild obstructive hydrocephalus. 3. No evidence of dural sinus thrombosis. Attenuation of the left basal vein in the quadrigeminal cistern without definite occlusion.  09/25/2020 CT Angio head/neck IMPRESSION: 1. No vascular malformation, anomaly or aneurysm. 2. No emergent large vessel occlusion or high-grade stenosis of the intracranial arteries. 3. Unchanged size of dorsal left pontine hemorrhage with intraventricular extension.  09/25/2020 CT head code stroke IMPRESSION: Acute intraparenchymal hemorrhage  in the dorsal left pons with subarachnoid extension into the cerebral aqueduct and fourth Ventricle.  EEG-LTM 4/5-4/6  This study showed periodic discharges with triphasic morphology at 1.5 to 2.5 Hz which is on the ictal-interictal continuum with low to intermediate potential for seizures.  Additionally there is evidence of moderate to severe diffuse encephalopathy, nonspecific  etiology.  No seizures were seen during the study  EEG adult 10/13/2020 This study is suggestive of moderate diffuse encephalopathy, nonspecific etiology. No seizures or definite epileptiform discharges were seen throughout the recording. [name]   CT HEAD WO CONTRAST  Result Date: 10/17/2020 CLINICAL DATA:  Follow-up intracranial hemorrhage EXAM: CT HEAD WITHOUT CONTRAST TECHNIQUE: Contiguous axial images were obtained from the base of the skull through the vertex without intravenous contrast. COMPARISON:  10/11/2020 FINDINGS: Brain: Intraparenchymal hemorrhage in the left side the upper pons and midbrain no longer shows any hyperdense components. There is an intra-axial cystic space measuring up to 15 mm in diameter, which may communicate with the fourth ventricle. No focal cerebellar abnormality is seen. Continuing diminishing density of blood along the ventriculostomy track in the right frontal region. Persistent brain edema surrounding that. Small amount of subarachnoid blood possibly subdural blood the ventriculostomy entry site appears similar. No evidence of any new or increased bleeding. Small cortical infarctions of the left frontoparietal vertex are barely appreciable. No evidence of new brain infarction. Ventricular size remains stable without evidence of ongoing aqueductal obstruction. Previously seen hyperdense blood dependent within the occipital horns is diminishing. Vascular: No vascular finding. Skull: Normal other than the right frontal burr hole. Sinuses/Orbits: Paranasal sinuses are clear. Orbits are normal. No fluid in the middle ears. Small mastoid effusions. Other: None IMPRESSION: 1. Continuing diminishing density of blood along the ventriculostomy track in the right frontal region. No evidence of any new or increased bleeding. Diminishing dependent intraventricular blood in the lateral ventricles. Very small amount of subarachnoid blood and possibly subdural blood at the  ventriculostomy entry site appears similar. 2. Small cortical infarctions of the left frontoparietal vertex are barely appreciable. No evidence of new or increased infarction. 3. Ventricular size remains stable without evidence of ongoing aqueductal obstruction. 4. 15 mm intra-axial cystic space in the left side of the upper pons and midbrain at the site of the previous hematoma. This may communicate with the fourth ventricle. Electronically Signed   By: Paulina Fusi M.D.   On: 10/17/2020 07:35   CT HEAD WO CONTRAST  Result Date: 10/17/2020 CLINICAL DATA:  Follow-up intracranial hemorrhage EXAM: CT HEAD WITHOUT CONTRAST TECHNIQUE: Contiguous axial images were obtained from the base of the skull through the vertex without intravenous contrast. COMPARISON:  10/11/2020 FINDINGS: Brain: Intraparenchymal hemorrhage in the left side the upper pons and midbrain no longer shows any hyperdense components. There is an intra-axial cystic space measuring up to 15 mm in diameter, which may communicate with the fourth ventricle. No focal cerebellar abnormality is seen. Continuing diminishing density of blood along the ventriculostomy track in the right frontal region. Persistent brain edema surrounding that. Small amount of subarachnoid blood possibly subdural blood the ventriculostomy entry site appears similar. No evidence of any new or increased bleeding. Small cortical infarctions of the left frontoparietal vertex are barely appreciable. No evidence of new brain infarction. Ventricular size remains stable without evidence of ongoing aqueductal obstruction. Previously seen hyperdense blood dependent within the occipital horns is diminishing. Vascular: No vascular finding. Skull: Normal other than the right frontal burr hole. Sinuses/Orbits: Paranasal sinuses are  clear. Orbits are normal. No fluid in the middle ears. Small mastoid effusions. Other: None IMPRESSION: 1. Continuing diminishing density of blood along the  ventriculostomy track in the right frontal region. No evidence of any new or increased bleeding. Diminishing dependent intraventricular blood in the lateral ventricles. Very small amount of subarachnoid blood and possibly subdural blood at the ventriculostomy entry site appears similar. 2. Small cortical infarctions of the left frontoparietal vertex are barely appreciable. No evidence of new or increased infarction. 3. Ventricular size remains stable without evidence of ongoing aqueductal obstruction. 4. 15 mm intra-axial cystic space in the left side of the upper pons and midbrain at the site of the previous hematoma. This may communicate with the fourth ventricle. Electronically Signed   By: Paulina Fusi M.D.   On: 10/17/2020 07:35   CT CHEST W CONTRAST  Result Date: 10/17/2020 CLINICAL DATA:  Fever of unknown origin. EXAM: CT CHEST, ABDOMEN, AND PELVIS WITH CONTRAST TECHNIQUE: Multidetector CT imaging of the chest, abdomen and pelvis was performed following the standard protocol during bolus administration of intravenous contrast. CONTRAST:  OMNIPAQUE IOHEXOL 300 MG/ML  SOLN COMPARISON:  Plain film 10/17/2020.  No prior CTs. FINDINGS: CT CHEST FINDINGS Cardiovascular: Aortic atherosclerosis. Normal heart size, without pericardial effusion. Bilateral pulmonary emboli are identified on this non dedicated exam. Example in the central right pulmonary artery on 28/3. Branch pulmonary arteries to both upper and lower lobes on 32/3. Within the left upper lobe pulmonary artery branch on 20/3. Mediastinum/Nodes: No mediastinal or hilar adenopathy. Lungs/Pleura: No pleural fluid. Endotracheal tube terminates appropriately. Areas of mild peripheral predominant airspace and ground-glass are indeterminate. Example at the left apex on 22/5 and right lower lobe on 58/5. More dependent bibasilar airspace opacities are likely due to atelectasis. Musculoskeletal: No acute osseous abnormality. CT ABDOMEN PELVIS FINDINGS  Hepatobiliary: Mild hepatomegaly at 18.4 cm craniocaudal. Normal gallbladder, without biliary ductal dilatation. Pancreas: Normal, without mass or ductal dilatation. Spleen: Normal in size, without focal abnormality. Adrenals/Urinary Tract: Normal adrenal glands. Bilateral renal sinus cysts, without hydronephrosis. Foley catheter within the urinary bladder. Stomach/Bowel: Nasogastric tube terminating at the gastric antrum. Rectal catheter in place. Scattered colonic diverticula. Normal terminal ileum and appendix. Normal small bowel. Vascular/Lymphatic: Aortic atherosclerosis. IVC filter is appropriately positioned. No abdominopelvic adenopathy. Reproductive: Mild prostatomegaly. Other: No significant free fluid. No free intraperitoneal air. Mild pelvic anasarca. tiny bilateral fat containing inguinal hernias. Musculoskeletal: Degenerative partial fusion of bilateral sacroiliac joints in. Degenerate disc disease at the lumbosacral junction. IMPRESSION: 1. Bilateral, large volume right greater than left pulmonary emboli on this nondedicated exam. No evidence of right heart strain. 2. Mild peripheral ground-glass opacities, favoring atelectasis or infarct. Atypical infection felt less likely. 3. Otherwise, no explanation for fever. 4.  Aortic Atherosclerosis (ICD10-I70.0). 5. Prostatomegaly. These results will be called to the ordering clinician or representative by the Radiologist Assistant, and communication documented in the PACS or [employer]. Electronically Signed   By: Jeronimo Greaves M.D.   On: 10/17/2020 14:43   CT ABDOMEN PELVIS W CONTRAST  Result Date: 10/17/2020 CLINICAL DATA:  Fever of unknown origin. EXAM: CT CHEST, ABDOMEN, AND PELVIS WITH CONTRAST TECHNIQUE: Multidetector CT imaging of the chest, abdomen and pelvis was performed following the standard protocol during bolus administration of intravenous contrast. CONTRAST:  OMNIPAQUE IOHEXOL 300 MG/ML  SOLN COMPARISON:  Plain film  10/17/2020.  No prior CTs. FINDINGS: CT CHEST FINDINGS Cardiovascular: Aortic atherosclerosis. Normal heart size, without pericardial effusion. Bilateral pulmonary emboli are identified  on this non dedicated exam. Example in the central right pulmonary artery on 28/3. Branch pulmonary arteries to both upper and lower lobes on 32/3. Within the left upper lobe pulmonary artery branch on 20/3. Mediastinum/Nodes: No mediastinal or hilar adenopathy. Lungs/Pleura: No pleural fluid. Endotracheal tube terminates appropriately. Areas of mild peripheral predominant airspace and ground-glass are indeterminate. Example at the left apex on 22/5 and right lower lobe on 58/5. More dependent bibasilar airspace opacities are likely due to atelectasis. Musculoskeletal: No acute osseous abnormality. CT ABDOMEN PELVIS FINDINGS Hepatobiliary: Mild hepatomegaly at 18.4 cm craniocaudal. Normal gallbladder, without biliary ductal dilatation. Pancreas: Normal, without mass or ductal dilatation. Spleen: Normal in size, without focal abnormality. Adrenals/Urinary Tract: Normal adrenal glands. Bilateral renal sinus cysts, without hydronephrosis. Foley catheter within the urinary bladder. Stomach/Bowel: Nasogastric tube terminating at the gastric antrum. Rectal catheter in place. Scattered colonic diverticula. Normal terminal ileum and appendix. Normal small bowel. Vascular/Lymphatic: Aortic atherosclerosis. IVC filter is appropriately positioned. No abdominopelvic adenopathy. Reproductive: Mild prostatomegaly. Other: No significant free fluid. No free intraperitoneal air. Mild pelvic anasarca. tiny bilateral fat containing inguinal hernias. Musculoskeletal: Degenerative partial fusion of bilateral sacroiliac joints in. Degenerate disc disease at the lumbosacral junction. IMPRESSION: 1. Bilateral, large volume right greater than left pulmonary emboli on this nondedicated exam. No evidence of right heart strain. 2. Mild peripheral ground-glass  opacities, favoring atelectasis or infarct. Atypical infection felt less likely. 3. Otherwise, no explanation for fever. 4.  Aortic Atherosclerosis (ICD10-I70.0). 5. Prostatomegaly. These results will be called to the ordering clinician or representative by the Radiologist Assistant, and communication documented in the PACS or [employer]. Electronically Signed   By: Jeronimo Greaves M.D.   On: 10/17/2020 14:43     PHYSICAL EXAM  Temp:  [97.6 F (36.4 C)-98.9 F (37.2 C)] 98.9 F (37.2 C) (04/28 1107) Pulse Rate:  [79-90] 88 (04/28 1107) Resp:  [18-24] 23 (04/28 1107) BP: (120-133)/(66-83) 120/72 (04/28 1107) SpO2:  [96 %-98 %] 97 % (04/28 1107) FiO2 (%):  [28 %] 28 % (04/28 0526) Weight:  [100.3 kg] 100.3 kg (04/28 0500)  General - Well nourished, well developed middle-aged Caucasian male, on trach collar  Ophthalmologic - fundi not visualized due to noncooperation.  Cardiovascular - Regular rate and rhythm, not in afib.  Neurological Exam- on trach collar now, awake alert and interactive.  Still has copious secretions; he tries to speak.[name]  He does follow commands consistently both midline and appendicular.  He is able to say a few words at times they are difficult to understand.  Still has vertical gaze palsy but able to look horizontally.  Right upper extremity 3/5 and left upper and lower extremity 4 -/5.  Right lower extremity 2/5.   ASSESSMENT/PLAN [name] is a 65 y.o. male with history of atrial flutter newly diagnosed with atrial fibrillation in February 2022 not on anticoagulation, follows with Dr. Johney Frame in cardiology.  He presented with right-sided weakness, slurred speech, headache, and vomiting.Last known well when he went to bed at 10 PM on 09/24/2020 and woke up at around 2 AM on the day of admission with slurred speech.  EMS was called.He was very drowsy upon their assessment and notable right side weakeness.  He had worsening mental status, GCS 5, so was  emergently intubated in ED for airway protection.  His MRI brain showed acute hemorrhage at left midbrain extending into pons with IVH associated with obstructive hydrocephalus.    ICH - Left Dorsalmidbrain ICHextending into dorsal  pons and inferior thalamus with mild mass-effect on third ventricle and hydrocephalus,source unknown, suspect occult cavernoma  Code Stroke CT head-Acute IPH in the dorsal left pons with subarachnoid extension into the cerebral aqueduct and fourth ventricle.  CTA head & neck-No vascular malformation, anomaly or aneurysm. No emergent large vessel occlusion or high-grade stenosis. Unchanged size of dorsal left pontine hemorrhage with intraventricular extension.  MRI& MR Venogram-Acute hemorrhage centered within the left dorsal midbrain extending into the dorsal pons and inferomedial thalamus with intraventricular extension. Compression of the cerebral aqueduct with possible mild obstructive hydrocephalus. No evidence of dural sinus thrombosis.   CT Head W/O Contrast 3/29 0250 -Unchanged left brainstem hemorrhage extending into the fourth ventricle. Worsening hydrocephalus.  Diagnostic Cerebral Angiogram - No evidence of an aneurysm, AVM, dural AV fistula or other vascular abnormality to explain patient's known intraparenchymal hemorrhage.  MRI brain 4/8 -stable IVH, midbrain ICH and right frontal EVD tract hemorrhage.  Ventricular size stable.  2-3 punctate infarcts left frontal and parietal lobes, corresponding to small acute/subacute infarcts.  CT head 4/13 stable IVH, ICH, decreased ventricle size  CT repeat 4/19 continue evolution of ICH and IVH, nearly absorbed now  2D Echo- EF:55-60%. No wall motion abnormalities.  LDL- 88  HgbA1c5.7  VTE prophylaxis - subcu heparin  No antithromboticprior to admission,  Initially on heparin IV.  Now on  Eliquis.  Therapy recommendations:CIR   Disposition:Pending CIR bed availability  Obstructive  hydrocephalus   EVD placed on 3/30   CT 4/1 showed clot in right lateral ventricle around catheter.   S/p 1mg  (1cc) alteplase placed in EVD 09/29/20 -> drain well ->ICP measuring 4-11 with a good waveform  CT head (4/3) resolution of most of the IVH and improved hydrocephalus.  EVD stopped working 4/2 overnight. 0.5mg  alteplase placed in EVD 10/01/2020 -> Post alteplase CT head new hemorrhage along the EVD tract an in the right lateral ventricle.  CT repeat 4/4 - decreased IVH and decompressed right lateral ventricle  CT repeat 4/5 -mild interval enlargement of ventricular size, no significant hydrocephalus.  IVH stable.  HCT 4/7 stable, with no appreciable change in overall ventricular size  HCT 4/13 decreased ventricular size  CT repeat 4/19 continue evolution of ICH and IVH, nearly absorbed now  Acute Respiratory Failure  Required intubation on 4/4 due to worsening MS and hypoxia  S/p trach 10/04/20  Off sedation  On trach collar with improving secretions -> speaking valve  Bilateral DVT Bilateral, large volume right greater than left PE  LE venous Doppler 4/10 with bilateral DVTs  Status post IVC filter  CT chest showed bilateral PE, which I believe related to her DVT found on 4/10, not after IVC filter  On Eliquis  Aspiration Pneumonia Community-acquired pneumonia with Moraxella Fever   Tmax 100.6->100.4->101.5->101.7->afebrile->100.1  Sputum culture (3/30): Moraxella  Sputum culture 4/5: neg  WBC 9.8->8.5->8.7->8.9->10.6->12.2->13.0->10.7  CXR 4/7, 4/8 left basilar opacities  CXR 4/19 unremarkable  Blood culture 4/8 positive for Acinetobacter  Off cefepime and vanco -> unasyn (off 4/23)  ID on board - CT abd/pelvis/chest no infection source found  SIRPIDs, resolved Severe encephalopathy, improving  Left UE intermittent jerking with stimulation  LTM EEG: GPEDs with triphasic wave, generalized SIRPIDs, although focal seizure is in DDx  S/p keppra  10/03/20 -> d/c concerning for angioedema -> on vimpat 10/04/20-> off vimpat 10/11/20  EEG 4/4 moderate to severe diffuse encephalopathy   Repeat spot EEG 4/15 moderate diffuse encephalopathy  Angioedema, resolved  Upper lip edema, much improved  Concerning for keppra allergy  Hypertension  Home meds:Diltiazem  Currently on Diltiazem 30 mg q6  off clonidine  BP goal Systolic < 160  Long-term BP goal normotensive  Hx of Aflutter PAF  [facility]  S/p CTI ablation for A flutter  off ASA  Not AC candidate at this time.  Afib with RVR intermittent -> sinus now  On Eliquis   Hyperlipidemia  Home meds:none  LDL88, goal < 70  AST/ALT 56/126->61/136  No statin now due to elevated LFTs  May consider to addstatin on discharge if LFT normalized  Dysphagia  Secondary to hemorrhagic stroke  Passed swallow  On Dys 1 and thin liquid  SLP on board  Tube feeds @ 1665ml/hr for nocturnal feeding  Encourage po intake  Other Stroke Risk Factors  Advanced Age >/= 2265  Snoring: sleep study denied by insurance prior to admission   Other Active Problems:  Hypokalemia  AKI Cre 0.95->1.14->1.38->1.25, on TF  Lethargic - on amantadine   Wound care following  Hospital day #31 Transferred to inpatient rehab.  Follow-up as an outpatient stroke clinic in 6 weeks.  He will need repeat MRI scan of the brain with and without contrast in about 2 to 3 months and follow-up.  Long discussion patient and wife and answered questions.  Discussed with rehab coordinator.  Greater than 50% time during this 25-minute visit was spent in counseling and coordination of care and discussion with care team.  Delia HeadyPramod Ayson Cherubini, MD

## 2020-10-26 NOTE — Progress Notes (Signed)
Occupational Therapy Treatment Patient Details Name: Walter [name] MRN: [id number] DOB: Apr 10, 1956 Today's Date: 10/26/2020    History of present illness 65 y.o. male admitted 09/25/20 with R-side weakness, slurred speach. Imaging showed acute hemorrhage in L dorsal midbrain extending into dorsal pons and thalamus with intraventricular extension. Developing hydrocephalus s/p EVD placement 3/29. ETT 3/28-3/30. Noted decrease in EVD drainage overnight 3/31, proximal and distal clots noted, flushed; repeat CT showed clot in R lateral ventricle. Pt with another clot episode 4/2; IV tPA administered. Re-intubated 4/4. Trach placed 4/6. BLE DVTs on Doppler 4/10. Chest CT 4/19 with bilateral PEs. Rapid called 4/26 due to asymmetric pupils; stat head CT with no acute injury, improving hemorrhage and edema. Considering PEG placement due to poor oral intake. PMH includes afib, COVID (12/2019), retinal detachment.   OT comments  Pt progressing towards established OT goals. Pt demonstrating increased activity tolerance and arousal level. Pt performing bed mobility with Mod A and then sitting at EOB with Min Guard-Min A. Pt performing sit<>stand with Mod A +2 and then pivot to recliner with Max A +2. Pt donning new gown with Mod A and doffing socks with figure four method and Mod A. Continue to recommend dc to CIR and will continue to follow acutely as admitted.   Follow Up Recommendations  CIR;Supervision/Assistance - 24 hour    Equipment Recommendations  Wheelchair cushion (measurements OT);Wheelchair (measurements OT);Hospital bed    Recommendations for Other Services Rehab consult    Precautions / Restrictions Precautions Precautions: Fall;Other (comment) Precaution Comments: bladder/bowel incontinence; condom cath, Cortrak, trach, PMV; bilateral mitts Required Braces or Orthoses: Other Brace       Mobility Bed Mobility Overal bed mobility: Needs Assistance Bed Mobility: Supine to Sit      Supine to sit: Mod assist;HOB elevated     General bed mobility comments: Cues for holding therapists hand. stabilizing pt's knees and then pt pulling into upright position.    Transfers Overall transfer level: Needs assistance Equipment used: 2 person hand held assist Transfers: Sit to/from [employer] Sit to Stand: +2 physical assistance;Mod assist Stand pivot transfers: Max assist;+2 safety/equipment;+2 physical assistance       General transfer comment: Mod A +2 for power up into standing. Pt demonstrating good initation to stand. Max A +2 for maintaining balance and pivoting hips    Balance Overall balance assessment: Needs assistance Sitting-balance support: Feet supported;Bilateral upper extremity supported Sitting balance-Leahy Scale: Poor     Standing balance support: Bilateral upper extremity supported;During functional activity Standing balance-Leahy Scale: Poor Standing balance comment: Mod-Max A +2                           ADL either performed or assessed with clinical judgement   ADL Overall ADL's : Needs assistance/impaired                 Upper Body Dressing : Moderate assistance;Sitting Upper Body Dressing Details (indicate cue type and reason): donning new gown Lower Body Dressing: Moderate assistance Lower Body Dressing Details (indicate cue type and reason): use of figure four for pt to doff socks Toilet Transfer: +2 for physical assistance;+2 for safety/equipment;Maximal assistance;Moderate assistance Toilet Transfer Details (indicate cue type and reason): Max A +2 for pivot to recliner Toileting- Clothing Manipulation and Hygiene: Total assistance;+2 for physical assistance;+2 for safety/equipment;Bed level Toileting - Clothing Manipulation Details (indicate cue type and reason): +2 for maintaining standing and then Total A for peri  care after bowel incontenience.     Functional mobility during ADLs: Moderate  assistance;+2 for physical assistance (sit<>stand with stedy) General ADL Comments: Demonstrating increased activity tolerance     Vision   Vision Assessment?: Yes Eye Alignment: Impaired (comment) (dysconjugate gaze) Ocular Range of Motion: Within Functional Limits Tracking/Visual Pursuits: Decreased smoothness of horizontal tracking;Decreased smoothness of vertical tracking Additional Comments: Reporting diplopia. R eye dominant. Tapping nasal portion of L eye. Poor tracking   Perception     Praxis      Cognition Arousal/Alertness: Awake/alert (becoming more fatigued/lethargic with activity) Behavior During Therapy: Flat affect;Restless Overall Cognitive Status: Impaired/Different from baseline Area of Impairment: Orientation;Attention;Following commands;Safety/judgement;Awareness;Problem solving                 Orientation Level: Disoriented to;Time;Situation Current Attention Level: Sustained   Following Commands: Follows one step commands consistently;Follows one step commands with increased time Safety/Judgement: Decreased awareness of safety;Decreased awareness of deficits Awareness: Intellectual Problem Solving: Slow processing;Decreased initiation;Difficulty sequencing;Requires verbal cues;Requires tactile cues General Comments: Pt following majority of direct, simple verbal commands this session; pt becoming more fatigued/lethargic with activity requiring cues to open eyes. Verbalizing well with PMV on, although verbalizing less towards end of session with fatigue and significant coughing        Exercises     Shoulder Instructions       General Comments Wife and daughter present    Pertinent Vitals/ Pain       Pain Assessment: Faces Faces Pain Scale: Hurts a little bit Pain Location: some grimacing with rolling, but reporting no pain when asked Pain Descriptors / Indicators: Grimacing  Home Living                                           Prior Functioning/Environment              Frequency  Min 2X/week        Progress Toward Goals  OT Goals(current goals can now be found in the care plan section)  Progress towards OT goals: Progressing toward goals  Acute Rehab OT Goals Patient Stated Goal: wants to go home OT Goal Formulation: With family Time For Goal Achievement: 10/27/20 Potential to Achieve Goals: Fair ADL Goals Pt Will Perform Grooming: with max assist;bed level Pt Will Perform Upper Body Dressing: with max assist;bed level Pt Will Transfer to Toilet: with +2 assist;with max assist;bedside commode;stand pivot transfer Additional ADL Goal #1: Pt will perform bed mobility with Max A +2 in preparation for ADLs Additional ADL Goal #2: pt will tolerate sitting EOB 10 minutes with mod (A) as precursor to adls  Plan Discharge plan remains appropriate;Frequency remains appropriate    Co-evaluation    PT/OT/SLP Co-Evaluation/Treatment: Yes Reason for Co-Treatment: To address functional/ADL transfers;For patient/therapist safety;Complexity of the patient's impairments (multi-system involvement)   OT goals addressed during session: ADL's and self-care      AM-PAC OT "6 Clicks" Daily Activity     Outcome Measure   Help from another person eating meals?: A Lot Help from another person taking care of personal grooming?: A Lot Help from another person toileting, which includes using toliet, bedpan, or urinal?: Total Help from another person bathing (including washing, rinsing, drying)?: Total Help from another person to put on and taking off regular upper body clothing?: A Lot Help from another person to put on and taking off regular  lower body clothing?: A Lot 6 Click Score: 10    End of Session Equipment Utilized During Treatment: Oxygen (5LPM 28% trach collar)  OT Visit Diagnosis: Unsteadiness on feet (R26.81);Other abnormalities of gait and mobility (R26.89);Muscle weakness (generalized)  (M62.81);Hemiplegia and hemiparesis Hemiplegia - Right/Left: Right Hemiplegia - dominant/non-dominant: Dominant Hemiplegia - caused by: Cerebral infarction   Activity Tolerance Patient tolerated treatment well   Patient Left with call bell/phone within reach;with family/visitor present;with nursing/sitter in room;in chair;with chair alarm set   Nurse Communication Mobility status        Time: 1219-7588 OT Time Calculation (min): 35 min  Charges: OT General Charges $OT Visit: 1 Visit OT Treatments $Self Care/Home Management : 8-22 mins  Dolton Shaker MSOT, OTR/L Acute Rehab Pager: [phone] Office: [phone]   Theodoro Grist Tomeshia Pizzi 10/26/2020, 1:00 PM

## 2020-10-26 NOTE — Progress Notes (Signed)
NAME:  Walter [name], MRN:  [id number], DOB:  [birth year], LOS: 31 ADMISSION DATE:  09/25/2020, CONSULTATION DATE:  09/25/20 REFERRING MD:  Wilford Corner,  CHIEF COMPLAINT:  Headache, r side weakness, slurred speech  History of Present Illness:  Mr. Vanallen is a 65 yo man with hx of afib not on anticoagulation admitted with right-sided weakness, hemorrhagic stroke. Emergently intubated in the ED for airway protection  Pertinent  Medical History  A fib/flutter s/p ablation and cardioversion, Retinal detachment, COVID 19 infection July 2021  Significant Hospital Events: Including procedures, antibiotic start and stop dates in addition to other pertinent events   . 3/28- Admit, intubated.  Not a candidate for TPA due to intracranial hemorrhage.  Started 3% saline . 3/29- EVD placement for developing hydrocephalus. CTA of the head was unremarkable . 3/30- Extubated. . 4/1 respiratory culture grew Moraxella, on ceftriaxone . 4/4 reintubated because of hypoxia and unresponsiveness . 4/6 tracheostomy . 4/7 still encephalopathic  . 4/8 remains encephalopathic . 4/10 Doppler shows bilateral DVT. IVC filter placed.  . 4/11 Blood cultures positive for Acinetobacter  . 4/12 Fever no worse off [place], off sedative infusions, tolerating trach collar.  . 4/13 Febrile @ 7am . 4/14 - 4/17 4/16 Afebrile Intermittently following simple commands and participated in PT, drowsy today . 4/18 febrile. Starting unasyn, sending trach asp . 4/19 T Max 101.7, WBC 12.2, ALT elevated, profoundly deconditioned . 4/20 awake and following commands. Trach aspirate w few GPC.  . 4/21 Tolerated trach collar >24h . 4/22 D1 diet, transfer out of ICU  Interim History / Subjective:  More alert this morning. Still in mitts. Feels like he is having trouble coughing up secretions. NT and tracheal suctioning with minimal secretions this morning. Inner canula changed by nurse at bedside.  Objective   Blood pressure 126/70, pulse  80, temperature 97.8 F (36.6 C), temperature source Axillary, resp. rate (!) 22, height 6\' 2"  (1.88 m), weight 100.3 kg, SpO2 96 %.    FiO2 (%):  [28 %] 28 %   Intake/Output Summary (Last 24 hours) at 10/26/2020 0942 Last data filed at 10/26/2020 0835 Gross per 24 hour  Intake 820 ml  Output 1002 ml  Net -182 ml   Filed Weights   10/23/20 0400 10/24/20 0500 10/26/20 0500  Weight: 102.6 kg 105.1 kg 100.3 kg    Physical Exam:  General - chronically ill, no respiratory distress ENT - 6.0 flexible shiley in place. No wheezes or crackles Cardiac - regular rate/rhythm, no murmur Chest - equal breath sounds b/l, no wheezing or rales Skin - no rashes Neuro - awake, following commands  Labs/imaging that I havepersonally reviewed  (right click and "Reselect all SmartList Selections" daily)  Na 133, K 4.5, Cr 0.72  CMP Latest Ref Rng & Units 10/25/2020 10/23/2020 10/22/2020  Glucose 70 - 99 mg/dL 10/24/2020) 448(J) 856(D)  BUN 8 - 23 mg/dL 20 22 149(F)  Creatinine 0.61 - 1.24 mg/dL 02(O 3.78 5.88  Sodium 135 - 145 mmol/L 133(L) 140 146(H)  Potassium 3.5 - 5.1 mmol/L 4.5 4.4 4.1  Chloride 98 - 111 mmol/L 101 107 112(H)  CO2 22 - 32 mmol/L 23 28 26   Calcium 8.9 - 10.3 mg/dL 5.02) ) 7.7(A)  Total Protein 6.5 - 8.1 g/dL - - 5.6(L)  Total Bilirubin 0.3 - 1.2 mg/dL - - 0.8  Alkaline Phos 38 - 126 U/L - - 116  AST 15 - 41 U/L - - 37  ALT 0 - 44 U/L - -  79(H)    CBC Latest Ref Rng & Units 10/25/2020 10/23/2020 10/22/2020  WBC 4.0 - 10.5 K/uL 8.3 8.4 8.2  Hemoglobin 13.0 - 17.0 g/dL 9.0(L) 9.2(L) 8.9(L)  Hematocrit 39.0 - 52.0 % 28.7(L) 29.6(L) 28.9(L)  Platelets 150 - 400 K/uL 181 175 150    CBG (last 3)  Recent Labs    10/26/20 0007 10/26/20 0457 10/26/20 0743  GLUCAP 120* 150* 70    Resolved Hospital Problem list   Acute obstructive hydrocephalus s/p EVD, Hypernatremia, Moraxella catarrhalis pneumonia s/p antibiotics, Thrombocytopenia, Acute midbrain/dorsal pons/thamalus  intraparenchymal hemorrhage  Assessment & Plan:   Acute hypoxic respiratory failure with compromised airway. Failure to wean s/p tracheostomy 4/06. - off vent since AM of 4/20 - goal SpO2 > 92% - f/u CXR as needed - trach care - continue PM valve as tolerated with SLP - I may be able to change him to a cuffless trach today which may facilitate PMV use   Durel Salts, MD Pulmonary and Critical Care Medicine [name]

## 2020-10-26 NOTE — Progress Notes (Signed)
Inpatient Rehab Admissions Coordinator:   Spoke with Dr. Catha Gosselin regarding admit to CIR today.  Pt currently pending results of venogram with possible intervention.  Will continue to follow.   Estill Dooms, PT, DPT Admissions Coordinator [phone] 10/26/20  4:00 PM

## 2020-10-26 NOTE — Progress Notes (Signed)
Physical Therapy Treatment Patient Details Name: Walter [name] MRN: [id number] DOB: Nov 04, 1955 Today's Date: 10/26/2020    History of Present Illness Pt is a 65 y.o. male admitted 09/25/20 with R-side weakness, slurred speach. Imaging showed acute hemorrhage in L dorsal midbrain extending into dorsal pons and thalamus with intraventricular extension. Developing hydrocephalus s/p EVD placement 3/29. ETT 3/28-3/30. Noted decrease in EVD drainage overnight 3/31, proximal and distal clots noted, flushed; repeat CT showed clot in R lateral ventricle. Pt with another clot episode 4/2; IV tPA administered. Re-intubated 4/4. Trach placed 4/6. BLE DVTs on Doppler 4/10. Chest CT 4/19 with bilateral PEs. Rapid called 4/26 due to asymmetric pupils; stat head CT with no acute injury, improving hemorrhage and edema. Considering PEG placement due to poor oral intake. PMH includes afib, COVID (12/2019), retinal detachment.   PT Comments    Pt progressing well with mobility, demonstrates improved activity tolerance, arousal level, and command following. Pt able to perform bed mobility with modA, able to perform standing modA+2 and take minimal pivotal steps to recliner with maxA+2. Pt with significantly improved movement initiation, as well as improved sequencing with verbal/tactile cues. Pt remains excellent candidate for intensive CIR-level therapies to maximize functional mobility and independence. Will continue to follow acutely to address established goals.   Follow Up Recommendations  CIR;Supervision/Assistance - 24 hour     Equipment Recommendations  Wheelchair;Wheelchair cushion;Hospital bed;Lift equipment    Recommendations for Other Services Rehab consult     Precautions / Restrictions Precautions Precautions: Fall;Other (comment) Precaution Comments: bladder/bowel incontinence; condom cath, Cortrak, trach, PMV; bilateral mitts Required Braces or Orthoses: Other Brace Restrictions Weight Bearing  Restrictions: No    Mobility  Bed Mobility Overal bed mobility: Needs Assistance Bed Mobility: Supine to Sit Rolling: Mod assist;+2 for safety/equipment   Supine to sit: Mod assist;HOB elevated     General bed mobility comments: Cues for holding therapists hand. stabilizing pt's knees and then pt pulling into upright position.    Transfers Overall transfer level: Needs assistance Equipment used: 2 person hand held assist Transfers: Sit to/from [employer] Sit to Stand: Mod assist;+2 physical assistance Stand pivot transfers: Max assist;+2 safety/equipment;+2 physical assistance       General transfer comment: Mod A +2 for power up into standing. Pt demonstrating good initation to stand. Max A +2 for maintaining balance and pivoting hips  Ambulation/Gait             General Gait Details: Noted taking minimal pivotal steps during stand pivot transfer with maxA+2 and bilateral HHA   Stairs             Wheelchair Mobility    Modified Rankin (Stroke Patients Only)       Balance Overall balance assessment: Needs assistance Sitting-balance support: Feet supported;Bilateral upper extremity supported Sitting balance-Leahy Scale: Fair Sitting balance - Comments: Able to maintain brief bouts of static sitting without external assist, noted improvement in anterior weight translation, requiring mod-maxA with fatigued Postural control: Posterior lean Standing balance support: Bilateral upper extremity supported;During functional activity Standing balance-Leahy Scale: Poor Standing balance comment: Reliant on UE support and external assist to maintain static standing                            Cognition Arousal/Alertness: Awake/alert (becoming more fatigued/lethargic with activity) Behavior During Therapy: Flat affect;Restless Overall Cognitive Status: Impaired/Different from baseline Area of Impairment: Orientation;Attention;Following  commands;Safety/judgement;Awareness;Problem solving  Orientation Level: Disoriented to;Time;Situation Current Attention Level: Sustained   Following Commands: Follows one step commands consistently;Follows one step commands with increased time Safety/Judgement: Decreased awareness of safety;Decreased awareness of deficits Awareness: Intellectual Problem Solving: Slow processing;Decreased initiation;Difficulty sequencing;Requires verbal cues;Requires tactile cues General Comments: Pt following majority of direct, simple verbal commands this session; pt becoming more fatigued/lethargic with activity requiring cues to open eyes. Verbalizing well with PMV on, although verbalizing less towards end of session with fatigue and significant coughing      Exercises Other Exercises Other Exercises: Seated LAQ    General Comments General comments (skin integrity, edema, etc.): Wife and daughter present. No significant coughing or difficulty breathing noted this session      Pertinent Vitals/Pain Pain Assessment: Faces Faces Pain Scale: Hurts a little bit Pain Location: some grimacing with rolling, but reporting no pain when asked Pain Descriptors / Indicators: Grimacing Pain Intervention(s): Monitored during session;Repositioned    Home Living                      Prior Function            PT Goals (current goals can now be found in the care plan section) Acute Rehab PT Goals Patient Stated Goal: wants to go home Progress towards PT goals: Progressing toward goals    Frequency    Min 4X/week      PT Plan Current plan remains appropriate    Co-evaluation PT/OT/SLP Co-Evaluation/Treatment: Yes Reason for Co-Treatment: Complexity of the patient's impairments (multi-system involvement);Necessary to address cognition/behavior during functional activity;For patient/therapist safety;To address functional/ADL transfers PT goals addressed during session:  Mobility/safety with mobility;Balance;Strengthening/ROM OT goals addressed during session: ADL's and self-care      AM-PAC PT "6 Clicks" Mobility   Outcome Measure  Help needed turning from your back to your side while in a flat bed without using bedrails?: A Lot Help needed moving from lying on your back to sitting on the side of a flat bed without using bedrails?: A Lot Help needed moving to and from a bed to a chair (including a wheelchair)?: Total Help needed standing up from a chair using your arms (e.g., wheelchair or bedside chair)?: Total Help needed to walk in hospital room?: Total Help needed climbing 3-5 steps with a railing? : Total 6 Click Score: 8    End of Session Equipment Utilized During Treatment: Gait belt Activity Tolerance: Patient tolerated treatment well Patient left: in chair;with call bell/phone within reach;with chair alarm set;with family/visitor present;with nursing/sitter in room Nurse Communication: Mobility status;Need for lift equipment;Other (comment) PT Visit Diagnosis: Hemiplegia and hemiparesis;Other abnormalities of gait and mobility (R26.89) Hemiplegia - Right/Left: Right Hemiplegia - dominant/non-dominant: Dominant Hemiplegia - caused by: Nontraumatic intracerebral hemorrhage     Time: 0922-0956 PT Time Calculation (min) (ACUTE ONLY): 34 min  Charges:  $Therapeutic Activity: 8-22 mins                     Ina Homes, PT, DPT Acute Rehabilitation Services  Pager [phone] Office [phone]  Malachy Chamber 10/26/2020, 3:26 PM

## 2020-10-26 NOTE — Progress Notes (Signed)
  Speech Language Pathology Treatment: Dysphagia;Cognitive-Linquistic;Passy Muir Speaking valve  Patient Details Name: Walter [name] MRN: [id number] DOB: [birth year] Today's Date: 10/26/2020 Time: 1015-1056 SLP Time Calculation (min) (ACUTE ONLY): 41 min  Assessment / Plan / Recommendation Clinical Impression  Excellent session, pt up in chair, able to sustain attention to all tasks with verbal cues despite visitors present. Pt repeated words and short phrases with increased volume after a verbal cue or model in 10/10 trials. Pt also able to increase breath support to blow on paper towel and achieve movement x3 and then carry over to blow on coffee x3 and in counting one breath per number x5. Pt demonstrates ability to sustain attention to function toothbrushing task with moderate verbal cues for problem solving. Pt also able to masticate soft solids today. Given improvement in arousal and consistent supervision with meals, will upgrade to mech soft diet today.   HPI HPI: 65 yo male presenting 3/28 with R-sided weakness and slurred speech. Imaging revealed acute hemorrhage centered within the left dorsal midbrain extending into the dorsal pons and inferomedial thalamus with intraventricular extension. EVD placement on 3/29. Extubated 3/30. Decrease in EVD drainage 3/31. Repeat CT shows significant clot in R lateral ventricle. EVD clotted 4/2 and intraventricular TpA administered. Reintubated 4/4. Tracheostomy 4/6. PMH Afib, COVID infection (July 2021), and retinal detachment.  Recent CXR showed. Mild right base subsegmental  atelectasis/infiltrate.      SLP Plan  Continue with current plan of care       Recommendations  Diet recommendations: Dysphagia 3 (mechanical soft);Thin liquid Liquids provided via: Cup;Straw Medication Administration: Whole meds with puree Supervision: Staff to assist with self feeding Compensations: Slow rate;Small sips/bites Postural Changes and/or Swallow Maneuvers:  Seated upright 90 degrees      Patient may use Passy-Muir Speech Valve: During all therapies with supervision;Caregiver trained to provide supervision PMSV Supervision: Intermittent MD: Please consider changing trach tube to : Cuffless         Oral Care Recommendations: Oral care BID Follow up Recommendations: Inpatient Rehab SLP Visit Diagnosis: Aphonia (R49.1);Cognitive communication deficit (M09.470) Plan: Continue with current plan of care       GO               [name], [name]  Acute Rehabilitation Services Pager [phone] Office [phone]    Claudine Mouton 10/26/2020, 11:01 AM

## 2020-10-26 NOTE — Progress Notes (Signed)
PROGRESS NOTE    Walter [name]  PRX:[id number] DOB: 1955/08/16 DOA: 09/25/2020 PCP: Alroy Dust, L.Marlou Sa, MD   Brief Narrative:  HPI on 09/25/2020 by Dr. Amie Portland (neurology) TRAVEION [name] is a 65 y.o. male past medical history of atrial flutter newly diagnosed with atrial fibrillation in February not on anticoagulation, follows with Dr. Rayann Heman in cardiology, last known well when he went to bed at 10 PM on 09/24/2020 and woke up at around 2 AM this morning and complained to his wife that he was having some discomfort in his head or ear. She was unable to understand him well and thought his speech was slurred.  EMS was called.  He was very drowsy and sleepy upon their assessment and was weaker on the right side in comparison to the left.  His vitals were not too abnormal-systolic blood pressures were in the 1 40-1 50 range on scene. He is not on any anticoagulation.  No prior history of strokes. He was brought in as an acute code stroke LVO positive. Evaluated at the ED bridge, with a GCS of 5. Right after the stat CT and CTA, he was emergently intubated due to inability to protect his airway-given GCS of 5.  Wife arrived much later. Provided history that nothing was out of the ordinary when he went to bed at 10 PM. Recent trip to [place] is the only travel history. She said that they have been cleaning their attic and there is although they did over the weekend.  Interim history Patient presented with acute CVA. Hospital course complicated by respiratory failure with hypoxia, s/p trach now on trach collar, pneumonia, PE/bilateral DVT s/p IVC filter.  Pending CIR. Assessment & Plan   Intracerebral hemorrhage -Patient presented with right-sided weakness, slurred speech.  Code stroke was called on presentation. -CT head showed acute intraparenchymal hemorrhage in the dorsal left pons with subarachnoid extension. -CT head and neck did not show vascular malformation, no LVO. -MRI on 4/8  showed 2-3 punctate infarcts in the left frontal and parietal lobes corresponding to small acute/subacute infarcts. -Echocardiogram showed EF 55 to 60%, no wall motion abnormalities -LDL 88, hemoglobin A1c 5.7 -Currently not on statin due to elevated LFTs -PT and OT recommending SAR  Acute hypoxic respiratory failure -Patient was intubated in the ED for airway protection -Failure to wean, status post tracheostomy on 10/04/2020.  Patient has been off of ventilator since 10/18/2020. -Currently on trach collar and on 5 L O2 -Continue Passy-Muir valve as tolerated -Speech therapy following as well as PCCM -PCCM feels that patient may be able to have a cuffless trach which may be changed today to help facilitate PMV use  Obstructive hydrocephalus -Intraventricular drain placed on 08/3020 -EVD stopped working on 09/30/2020 status post alteplase placement resulting in a new hemorrhage along the EVD tract -Repeat imaging showed improvement  Hospital-acquired pneumonia/ventilator associated pneumonia -Sputum culture from 09/27/20 was positive for Moraxella -Blood cultures show Acenatobacter -Infectious disease consulted and appreciated -Patient was treated with Unasyn and completed antibiotic course -Sputum culture on 10/16/2020 showed normal flora  Acute PE/bilateral lower extremity DVT -Lower extremity Doppler on 10/08/2020 showed acute DVT right lower extremity, age-indeterminate DVT on the left -Patient does have significant edema of the right lower extremity status post IVC filter placement on 4/10 -CTA showed bilateral PE on 09/2020 -Patient was on heparin drip and then transition to Eliquis -Patient with significant right lower extremity edema on 4/27, lower extremity Doppler reordered and showed DVT of the right  common femoral, SF junction, femoral, SF junction, proximal profunda, popliteal, posterior tibial, peroneal, soleal, gastrocnemius veins.  Question of IVC filter clot -Discussed with  hematology oncology and vascular surgery via phone on 10/25/2020, unlikely that this is a failure of anticoagulant given that anticoagulation was not started until 10/17/2020.  Would continue Eliquis at this time.  -Interventional radiology consulted and appreciated to discuss possible removal of IVC filter.  CT venogram ordered and currently pending.  Severe encephalopathy -Secondary to the above -Patient has had intermittent left upper extremity jerking -Focal seizures cannot be ruled out per LTM EEG which showed diffuse encephalopathy -Continue amantadine  Essential hypertension -BP currently stable  Paroxysmal atrial fibrillation -Status post ablation/cardioversion in the past -Was not on anticoagulation on admission -Currently on Eliquis -Continue Cardizem for rate control  Urinary retention -Resolved, continue condom cath  Hyperglycemia -As above hemoglobin A1c 5.7 -Continue insulin sliding scale and CBG monitoring  Dysphagia -Patient with cortrak in place -Speech therapy currently following and has placed patient on dysphagia 1 diet -PEG tube had been discussed with family however at this time would urge that we give patient more time prior to placement of PEG tube.  Discussed this with Dr. Leonie Man, neurology, who is also in agreement  Macrocytic anemia -Suspect secondary to acute illness as well as poor oral intake -Hemoglobin currently stable  Pressure ulcers -Wound care following Pressure Injury 10/21/20 Head Left;Posterior Stage 1 -  Intact skin with non-blanchable redness of a localized area usually over a bony prominence. Pink non-blanchable scalp with thinning hair on posterior left side of head (Active)  10/21/20 1100  Location: Head  Location Orientation: Left;Posterior  Staging: Stage 1 -  Intact skin with non-blanchable redness of a localized area usually over a bony prominence.  Wound Description (Comments): Pink non-blanchable scalp with thinning hair on  posterior left side of head  Present on Admission: No   DVT Prophylaxis Eliquis  Code Status: Full  Family Communication: Wife via phone  Disposition Plan:  Status is: Inpatient  Remains inpatient appropriate because:Inpatient level of care appropriate due to severity of illness   Dispo: The patient is from: Home              Anticipated d/c is to: CIR              Patient currently is medically stable to d/c.   Difficult to place patient No   Consultants Neurology Inpatient rehab PCCM Vascular surgery, Dr. Trula Slade, via phone Hematology/oncology, Dr. Irene Limbo, via phone  Procedures  Intubation/extubation Trach placement Echocardiogram Lower extremity Doppler IVC filter  Antibiotics   Anti-infectives (From admission, onward)   Start     Dose/Rate Route Frequency Ordered Stop   10/18/20 1100  Ampicillin-Sulbactam (UNASYN) 3 g in sodium chloride 0.9 % 100 mL IVPB        3 g 200 mL/hr over 30 Minutes Intravenous Every 6 hours 10/18/20 0931 10/21/20 0019   10/16/20 1100  Ampicillin-Sulbactam (UNASYN) 3 g in sodium chloride 0.9 % 100 mL IVPB  Status:  Discontinued        3 g 200 mL/hr over 30 Minutes Intravenous Every 6 hours 10/16/20 0948 10/18/20 0859   10/07/20 0000  vancomycin (VANCOREADY) IVPB 1500 mg/300 mL  Status:  Discontinued        1,500 mg 150 mL/hr over 120 Minutes Intravenous Every 12 hours 10/06/20 1000 10/07/20 1406   10/06/20 1200  ceFEPIme (MAXIPIME) 2 g in sodium chloride 0.9 % 100 mL IVPB  2 g 200 mL/hr over 30 Minutes Intravenous Every 8 hours 10/06/20 1000 10/13/20 2225   10/06/20 1100  vancomycin (VANCOREADY) IVPB 2000 mg/400 mL        2,000 mg 200 mL/hr over 120 Minutes Intravenous  Once 10/06/20 1000 10/06/20 1314   10/02/20 0900  Ampicillin-Sulbactam (UNASYN) 3 g in sodium chloride 0.9 % 100 mL IVPB  Status:  Discontinued        3 g 200 mL/hr over 30 Minutes Intravenous Every 6 hours 10/02/20 0812 10/06/20 0954   09/29/20 1130  cefTRIAXone  (ROCEPHIN) 2 g in sodium chloride 0.9 % 100 mL IVPB  Status:  Discontinued        2 g 200 mL/hr over 30 Minutes Intravenous Every 24 hours 09/29/20 1037 10/02/20 9563      Subjective:   Gilford Lardizabal seen and examined today.  Patient limited interaction today.  Able to mouth a few words and follow commands.  No complaints of chest pain or shortness of breath, just feeling tired today.    Objective:   Vitals:   10/26/20 0500 10/26/20 0526 10/26/20 0740 10/26/20 1107  BP:   126/70 120/72  Pulse:  90 80 88  Resp:  (!) 24 (!) 22 (!) 23  Temp:   97.8 F (36.6 C) 98.9 F (37.2 C)  TempSrc:   Axillary Oral  SpO2:  98% 96% 97%  Weight: 100.3 kg     Height:        Intake/Output Summary (Last 24 hours) at 10/26/2020 1256 Last data filed at 10/26/2020 0835 Gross per 24 hour  Intake 820 ml  Output 1003 ml  Net -183 ml   Filed Weights   10/23/20 0400 10/24/20 0500 10/26/20 0500  Weight: 102.6 kg 105.1 kg 100.3 kg    Exam  General: Well developed, deconditioned, chronically ill-appearing  HEENT: NCAT, mucous membranes moist.  Cortrak  Neck: Trach  Cardiovascular: S1 S2 auscultated, RRR  Respiratory: Diminished breath sounds anteriorly  Abdomen: Soft, nontender, nondistended, + bowel sounds  Extremities: warm dry without cyanosis clubbing or edema of LLE.  +RLE edema  Neuro: Awake and alert, able to converse however limited  Psych: appropriate   Data Reviewed: I have personally reviewed following labs and imaging studies  CBC: Recent Labs  Lab 10/20/20 0427 10/21/20 0032 10/22/20 0326 10/23/20 0751 10/25/20 0212  WBC 10.7* 8.9 8.2 8.4 8.3  NEUTROABS  --   --  5.3 5.8 5.3  HGB 9.9* 9.2* 8.9* 9.2* 9.0*  HCT 32.4* 30.8* 28.9* 29.6* 28.7*  MCV 102.9* 103.0* 102.8* 102.4* 102.1*  PLT 156 176 150 175 875   Basic Metabolic Panel: Recent Labs  Lab 10/19/20 2242 10/20/20 0427 10/21/20 0032 10/22/20 0326 10/23/20 0751 10/25/20 0212  NA 145 147* 146* 146* 140  133*  K 4.1 3.8 3.7 4.1 4.4 4.5  CL 107 109 110 112* 107 101  CO2 '29 28 28 26 28 23  ' GLUCOSE 120* 124* 131* 136* 153* 119*  BUN 46* 37* 31* 24* 22 20  CREATININE 1.04 0.97 0.91 0.78 0.82 0.72  CALCIUM 8.5* 8.7* 8.6* 8.7* 8.7* 8.8*  MG 2.9*  --   --   --   --   --    GFR: Estimated Creatinine Clearance: 116.4 mL/min (by C-G formula based on SCr of 0.72 mg/dL). Liver Function Tests: Recent Labs  Lab 10/22/20 0326  AST 37  ALT 79*  ALKPHOS 116  BILITOT 0.8  PROT 5.6*  ALBUMIN 2.5*   No  results for input(s): LIPASE, AMYLASE in the last 168 hours. No results for input(s): AMMONIA in the last 168 hours. Coagulation Profile: Recent Labs  Lab 10/25/20 1041  INR 1.1   Cardiac Enzymes: No results for input(s): CKTOTAL, CKMB, CKMBINDEX, TROPONINI in the last 168 hours. BNP (last 3 results) No results for input(s): PROBNP in the last 8760 hours. HbA1C: No results for input(s): HGBA1C in the last 72 hours. CBG: Recent Labs  Lab 10/26/20 0007 10/26/20 0457 10/26/20 0743 10/26/20 1104 10/26/20 1157  GLUCAP 120* 150* 70 129* 121*   Lipid Profile: No results for input(s): CHOL, HDL, LDLCALC, TRIG, CHOLHDL, LDLDIRECT in the last 72 hours. Thyroid Function Tests: No results for input(s): TSH, T4TOTAL, FREET4, T3FREE, THYROIDAB in the last 72 hours. Anemia Panel: No results for input(s): VITAMINB12, FOLATE, FERRITIN, TIBC, IRON, RETICCTPCT in the last 72 hours. Urine analysis:    Component Value Date/Time   COLORURINE YELLOW 10/06/2020 1042   APPEARANCEUR HAZY (A) 10/06/2020 1042   LABSPEC >1.030 (H) 10/06/2020 1042   PHURINE 5.5 10/06/2020 1042   GLUCOSEU NEGATIVE 10/06/2020 1042   HGBUR LARGE (A) 10/06/2020 1042   BILIRUBINUR NEGATIVE 10/06/2020 1042   [place] 10/06/2020 1042   PROTEINUR 30 (A) 10/06/2020 1042   NITRITE NEGATIVE 10/06/2020 1042   LEUKOCYTESUR NEGATIVE 10/06/2020 1042   Sepsis Labs: '@LABRCNTIP' (procalcitonin:4,lacticidven:4)  ) No  results found for this or any previous visit (from the past 240 hour(s)).    Radiology Studies: CT HEAD WO CONTRAST  Result Date: 10/24/2020 CLINICAL DATA:  Acute neuro deficit. Unequal pupils. Double vision. History of intracranial hemorrhage in shunt EXAM: CT HEAD WITHOUT CONTRAST TECHNIQUE: Contiguous axial images were obtained from the base of the skull through the vertex without intravenous contrast. COMPARISON:  CT head 10/17/2020.  MRI head 10/06/2020 FINDINGS: Brain: Resolving hemorrhage and edema along the right frontal ventricular shunt tract. Ventricle size normal and stable. CSF density cavity in the left posterior midbrain at the site of prior hemorrhage. This is stable from the prior CT. No new area of hemorrhage or infarction compared with recent studies. Vascular: Negative for hyperdense vessel Skull: Right parietal burr hole over the convexity. No acute skeletal abnormality. Sinuses/Orbits: Paranasal sinuses clear. Bilateral mastoid effusion. Negative orbit. NG tube in place. Other: None IMPRESSION: Ventricle size remains normal. Improving hemorrhage and edema along the shunt tract in the right frontal lobe. Stable cystic encephalomalacia left posterior midbrain at site of prior hemorrhage. No new area of hemorrhage or infarct compared with recent studies. Electronically Signed   By: Franchot Gallo M.D.   On: 10/24/2020 20:01   VAS [place] IVC/ILIAC (VENOUS ONLY)  Result Date: 10/25/2020 IVC/ILIAC STUDY Patient Name:  Walter [name]  Date of Exam:   10/25/2020 Medical Rec #: [id number]        Accession #:    [id number] Date of Birth: July 19, 1955        Patient Gender: M Patient Age:   65Y Exam Location:  [facility] Procedure:      VAS [place] IVC/ILIAC (VENOUS ONLY) Referring Phys: [id number] [facility] [place] Mazzaferro --------------------------------------------------------------------------------  Indications: Extensive right lower extremity DVT Limitations: Air/bowel gas, patient unable to cooperate and  obesity.  Comparison Study: No prior study Performing Technologist: Maudry Mayhew MHA, RDMS, RVT, RDCS  Examination Guidelines: A complete evaluation includes B-mode imaging, spectral Doppler, color Doppler, and power Doppler as needed of all accessible portions of each vessel. Bilateral testing is considered an integral part of a complete examination. Limited examinations for  reoccurring indications may be performed as noted.  IVC/Iliac Findings: +----------+------+--------+--------+    IVC    PatentThrombusComments +----------+------+--------+--------+ IVC Prox         acute           +----------+------+--------+--------+ IVC Mid          acute           +----------+------+--------+--------+ IVC Distal       acute           +----------+------+--------+--------+  +-------------------+---------+-----------+---------+-----------+--------+         CIV        RT-PatentRT-ThrombusLT-PatentLT-ThrombusComments +-------------------+---------+-----------+---------+-----------+--------+ Common Iliac Distal            acute    patent                      +-------------------+---------+-----------+---------+-----------+--------+  +-------------------------+---------+-----------+---------+-----------+--------+            EIV           RT-PatentRT-ThrombusLT-PatentLT-ThrombusComments +-------------------------+---------+-----------+---------+-----------+--------+ External Iliac Vein                  acute    patent                      Distal                                                                    +-------------------------+---------+-----------+---------+-----------+--------+   Summary: IVC/Iliac: There is evidence of acute thrombus involving the IVC. There is no evidence of thrombus involving the left common iliac vein. There is evidence of acute thrombus involving the right common iliac vein. There is no evidence of thrombus involving  the left external  iliac vein. There is evidence of acute thrombus involving the right external iliac vein.  *See table(s) above for measurements and observations.  Electronically signed by Harold Barban MD on 10/25/2020 at 6:16:09 PM.    Final    VAS [place] LOWER EXTREMITY VENOUS (DVT)  Result Date: 10/25/2020  Lower Venous DVT Study Patient Name:  SHAHZAIN KIESTER  Date of Exam:   10/25/2020 Medical Rec #: [id number]        Accession #:    [id number] Date of Birth: 1956/04/14        Patient Gender: M Patient Age:   31Y Exam [facility]  [facility] Procedure:      VAS [place] LOWER EXTREMITY VENOUS (DVT) Referring Phys: [id number] [facility] Eulene Pekar --------------------------------------------------------------------------------  Indications: Recent right lower extremity DVT with worsening right lower extremity edema.  Limitations: Patient unable to cooperate. Comparison Study: 10/08/2020 lower extremity venous duplex- extensive right lower                   extremity acute DVT Performing Technologist: Maudry Mayhew MHA, RDMS, RVT, RDCS  Examination Guidelines: A complete evaluation includes B-mode imaging, spectral Doppler, color Doppler, and power Doppler as needed of all accessible portions of each vessel. Bilateral testing is considered an integral part of a complete examination. Limited examinations for reoccurring indications may be performed as noted. The reflux portion of the exam is performed with the patient in reverse Trendelenburg.  +---------+---------------+---------+-----------+----------+--------------+ RIGHT    CompressibilityPhasicitySpontaneityPropertiesThrombus Aging +---------+---------------+---------+-----------+----------+--------------+ CFV      None  No                   Acute          +---------+---------------+---------+-----------+----------+--------------+ SFJ      None                                         Acute           +---------+---------------+---------+-----------+----------+--------------+ FV Prox  None                                         Acute          +---------+---------------+---------+-----------+----------+--------------+ FV Mid   None                                         Acute          +---------+---------------+---------+-----------+----------+--------------+ FV DistalNone                                         Acute          +---------+---------------+---------+-----------+----------+--------------+ PFV      None                                         Acute          +---------+---------------+---------+-----------+----------+--------------+ POP      None                    No                   Acute          +---------+---------------+---------+-----------+----------+--------------+ PTV      None                                         Acute          +---------+---------------+---------+-----------+----------+--------------+ PERO     None                                         Acute          +---------+---------------+---------+-----------+----------+--------------+ Soleal   None                                         Acute          +---------+---------------+---------+-----------+----------+--------------+ Gastroc  None                                         Acute          +---------+---------------+---------+-----------+----------+--------------+   +----+---------------+---------+-----------+----------+-----------------+ LEFTCompressibilityPhasicitySpontaneityPropertiesThrombus Aging    +----+---------------+---------+-----------+----------+-----------------+  CFV Partial                 Yes                  Age Indeterminate +----+---------------+---------+-----------+----------+-----------------+     Summary: RIGHT: - Findings consistent with acute deep vein thrombosis involving the right common femoral vein, SF junction, right  femoral vein, right proximal profunda vein, right popliteal vein, right posterior tibial veins, right peroneal veins, right soleal veins, and right gastrocnemius veins. - No cystic structure found in the popliteal fossa.  LEFT: - Findings consistent with age indeterminate deep vein thrombosis involving the left common femoral vein.  *See table(s) above for measurements and observations. Electronically signed by Harold Barban MD on 10/25/2020 at 6:15:59 PM.    Final      Scheduled Meds: . amantadine  200 mg Per Tube BID  . apixaban  5 mg Per Tube BID  . chlorhexidine gluconate (MEDLINE KIT)  15 mL Mouth Rinse BID  . Chlorhexidine Gluconate Cloth  6 each Topical Daily  . diltiazem  30 mg Per Tube Q6H  . doxazosin  2 mg Per Tube Daily  . feeding supplement  237 mL Oral BID BM  . feeding supplement (PROSource TF)  45 mL Per Tube TID  . feeding supplement (VITAL 1.5 CAL)  960 mL Per Tube Q24H  . free water  200 mL Per Tube Q4H  . insulin aspart  0-20 Units Subcutaneous Q4H  . insulin glargine  6 Units Subcutaneous Daily  . mouth rinse  15 mL Mouth Rinse 10 times per day  . ondansetron (ZOFRAN) IV  4 mg Intravenous Q6H  . sodium chloride flush  10-40 mL Intracatheter Q12H   Continuous Infusions: . sodium chloride Stopped (10/19/20 1506)     LOS: 31 days   Time Spent in minutes   45 minutes  Mortimer Bair D.O. on 10/26/2020 at 12:56 PM  Between 7am to 7pm - Please see pager noted on [email or web address]  After 7pm go to [email or web address]  And look for the night coverage person covering for me after hours  [facility] Office  [phone]

## 2020-10-26 NOTE — Care Management Important Message (Signed)
Important Message  Patient Details  Name: Walter [name] MRN: [id number] Date of Birth: 12-07-1955   Medicare Important Message Given:  Yes     Dorena Bodo 10/26/2020, 12:17 PM

## 2020-10-27 ENCOUNTER — Inpatient Hospital Stay (HOSPITAL_COMMUNITY): Payer: Medicare Other

## 2020-10-27 ENCOUNTER — Encounter (HOSPITAL_COMMUNITY): Payer: Self-pay | Admitting: Student in an Organized Health Care Education/Training Program

## 2020-10-27 HISTORY — PX: IR US GUIDE VASC ACCESS LEFT: IMG2389

## 2020-10-27 HISTORY — PX: IR VENOCAVAGRAM IVC: IMG678

## 2020-10-27 HISTORY — PX: IR THROMBECT VENO MECH MOD SED: IMG2300

## 2020-10-27 LAB — GLUCOSE, CAPILLARY
Glucose-Capillary: 108 mg/dL — ABNORMAL HIGH (ref 70–99)
Glucose-Capillary: 111 mg/dL — ABNORMAL HIGH (ref 70–99)
Glucose-Capillary: 112 mg/dL — ABNORMAL HIGH (ref 70–99)
Glucose-Capillary: 115 mg/dL — ABNORMAL HIGH (ref 70–99)
Glucose-Capillary: 156 mg/dL — ABNORMAL HIGH (ref 70–99)

## 2020-10-27 LAB — HEPATIC FUNCTION PANEL
ALT: 57 U/L — ABNORMAL HIGH (ref 0–44)
AST: 39 U/L (ref 15–41)
Albumin: 2.9 g/dL — ABNORMAL LOW (ref 3.5–5.0)
Alkaline Phosphatase: 95 U/L (ref 38–126)
Bilirubin, Direct: 0.1 mg/dL (ref 0.0–0.2)
Indirect Bilirubin: 0.7 mg/dL (ref 0.3–0.9)
Total Bilirubin: 0.8 mg/dL (ref 0.3–1.2)
Total Protein: 5.7 g/dL — ABNORMAL LOW (ref 6.5–8.1)

## 2020-10-27 LAB — BASIC METABOLIC PANEL
Anion gap: 8 (ref 5–15)
BUN: 13 mg/dL (ref 8–23)
CO2: 25 mmol/L (ref 22–32)
Calcium: 8.7 mg/dL — ABNORMAL LOW (ref 8.9–10.3)
Chloride: 99 mmol/L (ref 98–111)
Creatinine, Ser: 0.65 mg/dL (ref 0.61–1.24)
GFR, Estimated: >60 mL/min (ref >60)
Glucose, Bld: 98 mg/dL (ref 70–99)
Potassium: 4.1 mmol/L (ref 3.5–5.1)
Sodium: 132 mmol/L — ABNORMAL LOW (ref 135–145)

## 2020-10-27 LAB — CBC
HCT: 29.2 % — ABNORMAL LOW (ref 39.0–52.0)
Hemoglobin: 9.2 g/dL — ABNORMAL LOW (ref 13.0–17.0)
MCH: 31.7 pg (ref 26.0–34.0)
MCHC: 31.5 g/dL (ref 30.0–36.0)
MCV: 100.7 fL — ABNORMAL HIGH (ref 80.0–100.0)
Platelets: 199 10*3/uL (ref 150–400)
RBC: 2.9 MIL/uL — ABNORMAL LOW (ref 4.22–5.81)
RDW: 15.6 % — ABNORMAL HIGH (ref 11.5–15.5)
WBC: 7.2 10*3/uL (ref 4.0–10.5)
nRBC: 0 % (ref 0.0–0.2)

## 2020-10-27 LAB — POCT ACTIVATED CLOTTING TIME
Activated Clotting Time: 101 seconds
Activated Clotting Time: 232 seconds

## 2020-10-27 MED ORDER — MIDAZOLAM HCL 2 MG/2ML IJ SOLN
INTRAMUSCULAR | Status: AC
  Filled 2020-10-27: qty 2

## 2020-10-27 MED ORDER — FENTANYL CITRATE (PF) 100 MCG/2ML IJ SOLN
INTRAMUSCULAR | Status: AC
  Filled 2020-10-27: qty 2

## 2020-10-27 MED ORDER — IOHEXOL 300 MG/ML  SOLN
100.0000 mL | Freq: Once | INTRAMUSCULAR | Status: AC | PRN
  Administered 2020-10-27: 30 mL via INTRAVENOUS

## 2020-10-27 MED ORDER — IOHEXOL 300 MG/ML  SOLN
100.0000 mL | Freq: Once | INTRAMUSCULAR | Status: AC | PRN
  Administered 2020-10-27: 100 mL via INTRAVENOUS

## 2020-10-27 MED ORDER — HEPARIN SODIUM (PORCINE) 1000 UNIT/ML IJ SOLN
INTRAMUSCULAR | Status: AC
  Filled 2020-10-27: qty 1

## 2020-10-27 MED ORDER — FENTANYL CITRATE (PF) 100 MCG/2ML IJ SOLN
INTRAMUSCULAR | Status: AC | PRN
  Administered 2020-10-27: 50 ug via INTRAVENOUS
  Administered 2020-10-27: 25 ug via INTRAVENOUS

## 2020-10-27 MED ORDER — LIDOCAINE HCL 1 % IJ SOLN
INTRAMUSCULAR | Status: AC
  Filled 2020-10-27: qty 20

## 2020-10-27 MED ORDER — HEPARIN SODIUM (PORCINE) 1000 UNIT/ML IJ SOLN
INTRAMUSCULAR | Status: AC | PRN
Start: 2020-10-27 — End: 2020-10-27
  Administered 2020-10-27: 5000 [IU] via INTRAVENOUS
  Administered 2020-10-27: 8000 [IU] via INTRAVENOUS
  Administered 2020-10-27: 5000 [IU] via INTRAVENOUS

## 2020-10-27 MED ORDER — QUETIAPINE FUMARATE 25 MG PO TABS
12.5000 mg | ORAL_TABLET | Freq: Every day | ORAL | Status: DC
  Administered 2020-10-27: 12.5 mg via ORAL
  Filled 2020-10-27: qty 1

## 2020-10-27 MED ORDER — MIDAZOLAM HCL 2 MG/2ML IJ SOLN
INTRAMUSCULAR | Status: AC | PRN
  Administered 2020-10-27: 1 mg via INTRAVENOUS
  Administered 2020-10-27: 0.5 mg via INTRAVENOUS

## 2020-10-27 NOTE — Progress Notes (Signed)
Kept pt on NPO, NGT feeding stopped, in preparation on planned procedure tomorrow

## 2020-10-27 NOTE — H&P (Signed)
Chief Complaint: Right LE DVT Caval thrombus  Referring Physician(s): Edsel Petrin  Supervising Physician: Oley Balm  Patient Status: [facility] - In-pt  History of Present Illness: Walter [name] is a 65 y.o. male with medical issues including newly diagnosed Atrial fibrillation and recent acute CVA with GCS of 5 on arrival to [facility] on 09/25/20.  MRI showed= Acute hemorrhage centered within the left dorsal midbrain extending into the dorsal pons and inferomedial thalamus with intraventricular extension  He is familiar to IR service.   He underwent cerebral arteriogram by Dr. Tommie Sams on 09/26/20 = Unremarkable cerebral angiogram.  Specifically, no evidence of an aneurysm, AVM, dural AV fistula or other vascular abnormality to explain patient's known intraparenchymal hemorrhage.  He has a lengthy hospital stay complicated by respiratory failure with hypoxia, s/p trach now on trach collar, pneumonia, PE/bilateral DVT s/p IVC filter done 10/08/20 by Dr. Elby Showers.  On 10/25/20 he developed more significant right lower extremity edema.  Lower extremity doppler reordered and showed DVT of the right common femoral, SF junction, femoral, SF junction, proximal profunda, popliteal, posterior tibial, peroneal, soleal, gastrocnemius veins   Venogram showed = Positive for thrombus involving the IVC and iliac veins. Large amount of thrombus involving the infrarenal IVC and IVC filter. Suprarenal IVC is patent. Thrombus in the right iliac veins and thrombus in the proximal femoral veins bilaterally.  He is currently on Eliquis, but given his recent intracranial hemorrhage, he is NO a candidate for thrombolysis.  We are asked to evaluate him for mechanical thrombectomy and retrieval of the IVC filter.  Tubes feeds held.   Past Medical History:  Diagnosis Date  . Atrial flutter (HCC)   . Dizzy 09/25/13  . Near syncope 09/25/13    Past Surgical History:  Procedure  Laterality Date  . A-FLUTTER ABLATION N/A 03/09/2020   Procedure: A-FLUTTER ABLATION;  Surgeon: Hillis Range, MD;  Location: [facility] INVASIVE CV LAB;  Service: Cardiovascular;  Laterality: N/A;  . CARDIOVERSION N/A 09/27/2013   Procedure: CARDIOVERSION;  Surgeon: Lewayne Bunting, MD;  Location: [facility] ENDOSCOPY;  Service: Cardiovascular;  Laterality: N/A;  . IR ANGIO EXTERNAL CAROTID SEL EXT CAROTID UNI R MOD SED  09/26/2020  . IR ANGIO INTRA EXTRACRAN SEL COM CAROTID INNOMINATE BILAT MOD SED  09/26/2020  . IR ANGIO VERTEBRAL SEL VERTEBRAL UNI R MOD SED  09/26/2020  . IR IVC FILTER PLMT / S&I /IMG GUID/MOD SED  10/08/2020  . IR US GUIDE VASC ACCESS RIGHT  09/26/2020  . RETINAL DETACHMENT SURGERY    . right knee arthroscopy    . TEE WITHOUT CARDIOVERSION N/A 09/27/2013   Procedure: TRANSESOPHAGEAL ECHOCARDIOGRAM (TEE);  Surgeon: Lewayne Bunting, MD;  Location: [facility] ENDOSCOPY;  Service: Cardiovascular;  Laterality: N/A;  . WRIST SURGERY      Allergies: Keppra [levetiracetam] and Latex  Medications: Prior to Admission medications   Medication Sig Start Date End Date Taking? Authorizing Provider  Bioflavonoid Products (ESTER-C) 500-550 MG TABS Take 1 tablet by mouth daily.   Yes [provider]  cholecalciferol (VITAMIN D3) 25 MCG (1000 UNIT) tablet Take 1,000 Units by mouth daily.   Yes [provider]  diltiazem (CARDIZEM CD) 120 MG 24 hr capsule Take 1 capsule (120 mg total) by mouth daily. 08/21/20  Yes Allred, Fayrene Fearing, MD  EPINEPHrine 0.3 mg/0.3 mL IJ SOAJ injection Inject 0.3 mg into the muscle once as needed for anaphylaxis. 02/07/20  Yes [provider]     Family History  Problem Relation Age of Onset  . Heart disease Mother   . Heart disease Father   . Healthy Sister   . Healthy Brother     Social History   Socioeconomic History  . Marital status: Married    Spouse name: Not on file  . Number of children: Not on file  . Years of education: Not on file  . Highest  education level: Not on file  Occupational History  . Not on file  Tobacco Use  . Smoking status: Never Smoker  . Smokeless tobacco: Never Used  Substance and Sexual Activity  . Alcohol use: Yes    Comment: occasionally  . Drug use: No  . Sexual activity: Not on file  Other Topics Concern  . Not on file  Social History Narrative   Pt lives in [place].  Works as a Copywriter, advertising for [employer].   Married. 2 children   Social Determinants of Corporate investment banker Strain: Not on file  Food Insecurity: Not on file  Transportation Needs: Not on file  Physical Activity: Not on file  Stress: Not on file  Social Connections: Not on file   Review of Systems  Unable to perform ROS: Mental status change    Vital Signs: BP 115/66   Pulse 89   Temp 98.7 F (37.1 C) (Oral)   Resp (!) 23   Ht 6\' 2"  (1.88 m)   Wt 100 kg   SpO2 95%   BMI 28.31 kg/m   Physical Exam Vitals reviewed.  Constitutional:      Appearance: He is ill-appearing.  HENT:     Head: Normocephalic and atraumatic.  Neck:     Comments: Tracheostomy with collar Cardiovascular:     Rate and Rhythm: Normal rate and regular rhythm.  Pulmonary:     Effort: Pulmonary effort is normal. No respiratory distress.  Abdominal:     Palpations: Abdomen is soft.  Musculoskeletal:        General: Swelling present.     Comments: Right lower extremity edema  Skin:    General: Skin is warm and dry.     Imaging: CT HEAD WO CONTRAST  Result Date: 10/24/2020 CLINICAL DATA:  Acute neuro deficit. Unequal pupils. Double vision. History of intracranial hemorrhage in shunt EXAM: CT HEAD WITHOUT CONTRAST TECHNIQUE: Contiguous axial images were obtained from the base of the skull through the vertex without intravenous contrast. COMPARISON:  CT head 10/17/2020.  MRI head 10/06/2020 FINDINGS: Brain: Resolving hemorrhage and edema along the right frontal ventricular shunt tract. Ventricle size normal and stable. CSF  density cavity in the left posterior midbrain at the site of prior hemorrhage. This is stable from the prior CT. No new area of hemorrhage or infarction compared with recent studies. Vascular: Negative for hyperdense vessel Skull: Right parietal burr hole over the convexity. No acute skeletal abnormality. Sinuses/Orbits: Paranasal sinuses clear. Bilateral mastoid effusion. Negative orbit. NG tube in place. Other: None IMPRESSION: Ventricle size remains normal. Improving hemorrhage and edema along the shunt tract in the right frontal lobe. Stable cystic encephalomalacia left posterior midbrain at site of prior hemorrhage. No new area of hemorrhage or infarct compared with recent studies. Electronically Signed   By: Marlan Palau M.D.   On: 10/24/2020 20:01   CT HEAD WO CONTRAST  Result Date: 10/17/2020 CLINICAL DATA:  Follow-up intracranial hemorrhage EXAM: CT HEAD WITHOUT CONTRAST TECHNIQUE: Contiguous axial images were obtained from the base of the skull through the vertex without intravenous  contrast. COMPARISON:  10/11/2020 FINDINGS: Brain: Intraparenchymal hemorrhage in the left side the upper pons and midbrain no longer shows any hyperdense components. There is an intra-axial cystic space measuring up to 15 mm in diameter, which may communicate with the fourth ventricle. No focal cerebellar abnormality is seen. Continuing diminishing density of blood along the ventriculostomy track in the right frontal region. Persistent brain edema surrounding that. Small amount of subarachnoid blood possibly subdural blood the ventriculostomy entry site appears similar. No evidence of any new or increased bleeding. Small cortical infarctions of the left frontoparietal vertex are barely appreciable. No evidence of new brain infarction. Ventricular size remains stable without evidence of ongoing aqueductal obstruction. Previously seen hyperdense blood dependent within the occipital horns is diminishing. Vascular: No  vascular finding. Skull: Normal other than the right frontal burr hole. Sinuses/Orbits: Paranasal sinuses are clear. Orbits are normal. No fluid in the middle ears. Small mastoid effusions. Other: None IMPRESSION: 1. Continuing diminishing density of blood along the ventriculostomy track in the right frontal region. No evidence of any new or increased bleeding. Diminishing dependent intraventricular blood in the lateral ventricles. Very small amount of subarachnoid blood and possibly subdural blood at the ventriculostomy entry site appears similar. 2. Small cortical infarctions of the left frontoparietal vertex are barely appreciable. No evidence of new or increased infarction. 3. Ventricular size remains stable without evidence of ongoing aqueductal obstruction. 4. 15 mm intra-axial cystic space in the left side of the upper pons and midbrain at the site of the previous hematoma. This may communicate with the fourth ventricle. Electronically Signed   By: Paulina Fusi M.D.   On: 10/17/2020 07:35   CT HEAD WO CONTRAST  Result Date: 10/11/2020 CLINICAL DATA:  65 year old male with left dorsal midbrain hemorrhage, intraventricular extension presenting on 09/25/2020. Several superimposed small infarcts. Subsequent encounter. EXAM: CT HEAD WITHOUT CONTRAST TECHNIQUE: Contiguous axial images were obtained from the base of the skull through the vertex without intravenous contrast. COMPARISON:  Brain MRI 10/06/2020 and earlier. FINDINGS: Brain: Mildly decreased ventricle size since 10/06/2020. Stable small volume of intraventricular hemorrhage, mostly layering in the occipital horns. Small parenchymal hematoma along the course of the right EVD is stable since 10/06/2020 (series 3, images 27-31) with confluent regional edema. There is a small volume of right superior frontal gyrus subarachnoid hemorrhage associated and stable (image 32 today). Additional small volume subarachnoid hemorrhage over both convexities  elsewhere. Decreasing hyperdense blood products at the left midbrain and thalamus since 10/06/2020. Continued heterogeneous edema and/or developing encephalomalacia at those sites. Decreased conspicuity of the left cerebellar PICA infarct from earlier this month. Small foci of left frontal or restricted diffusion recently on MRI are largely occult by CT. No new major vascular territory infarct identified. No midline shift.  Normal basilar cisterns. Vascular: Mild Calcified atherosclerosis at the skull base. Skull: No acute osseous abnormality identified. Stable burr hole from the previous right superior approach EVD. Sinuses/Orbits: Increased bilateral middle ear and mastoid opacification. Stable left nasoenteric tube. Paranasal sinus aeration remains satisfactory. Other: Sequelae of the right superior approach EVD. Stable orbit and scalp soft tissues. IMPRESSION: 1. Decreased, improved ventricle size since 10/06/2020 with stable small volume of IVH. 2. Hematoma and edema along the right superior approach EVD tract is stable, including a small volume of bilateral SAH. 3. Expected evolution of the left midbrain/thalamic hemorrhage and edema. 4. Small additional scattered subacute infarcts remain largely occult by CT. 5. No new intracranial abnormality identified. Electronically Signed   By: Rexene Edison  Margo Aye M.D.   On: 10/11/2020 04:43   CT HEAD WO CONTRAST  Result Date: 10/06/2020 CLINICAL DATA:  Hydrocephalus EXAM: CT HEAD WITHOUT CONTRAST TECHNIQUE: Contiguous axial images were obtained from the base of the skull through the vertex without intravenous contrast. COMPARISON:  10/05/2020 FINDINGS: Brain: Unchanged intraparenchymal hematoma in the right frontal lobe along the tract of the previously present EVD. There is subarachnoid blood over the right hemisphere and within both lateral ventricles. Unchanged appearance of intraparenchymal dorsal left midbrain hemorrhage. Is unchanged size and configuration of the  ventricles. Vascular: No hyperdense vessel or unexpected calcification. Skull: Normal. Negative for fracture or focal lesion. Sinuses/Orbits: No acute finding. Other: None. IMPRESSION: 1. Unchanged appearance of intraparenchymal and subarachnoid hemorrhage, predominantly along the course of the EVD catheter that has now been removed. 2. Unchanged dorsal left midbrain intraparenchymal hemorrhage. Electronically Signed   By: Deatra Robinson M.D.   On: 10/06/2020 03:10   CT HEAD WO CONTRAST  Result Date: 10/05/2020 CLINICAL DATA:  Follow-up examination for intracranial hemorrhage. EXAM: CT HEAD WITHOUT CONTRAST TECHNIQUE: Contiguous axial images were obtained from the base of the skull through the vertex without intravenous contrast. COMPARISON:  Prior CT from 10/03/2020. FINDINGS: Brain: Right frontal approach ventriculostomy catheter remains in place with tip in the body of the right lateral ventricle. Intraparenchymal hemorrhage about the catheter tract with associated edema is relatively similar. Intraventricular blood layering primarily within the occipital horns of both lateral ventricles again seen, slightly decreased from prior. Overall ventricular size is not appreciably changed. No midline shift. Scattered subarachnoid hemorrhage overlying the right frontal and posterior cerebral convexities again seen, little interval change from previous. Hemorrhage at the dorsal left midbrain has continued to evolve and is now slightly decreased in size and less dense as compared to previous (series 3, image 14). Adjacent cerebral aqueduct remains patent. No other new hemorrhage. No interval large vessel territory infarct. Vascular: No hyperdense vessel. Skull: Right frontal burr hole with ventriculostomy in place. Sinuses/Orbits: Globes orbital soft tissues within normal limits. Nasogastric tube remains in place with scattered mucosal thickening within the ethmoidal air cells and maxillary sinuses. Small bilateral  mastoid effusions noted. Other: None. IMPRESSION: 1. Stable position of right frontal approach ventriculostomy catheter with tip in the body of the right lateral ventricle. Intraventricular hemorrhage is slightly decreased from previous, with no appreciable change in overall ventricular size. 2. No significant interval change in hemorrhage along the catheter tract with additional scattered subarachnoid hemorrhage. 3. Slight interval decrease in size and density of dorsal left midbrain hemorrhage. 4. No other new acute intracranial abnormality. Electronically Signed   By: Rise Mu M.D.   On: 10/05/2020 03:59   CT HEAD WO CONTRAST  Result Date: 10/03/2020 CLINICAL DATA:  Intracranial hemorrhage. EXAM: CT HEAD WITHOUT CONTRAST TECHNIQUE: Contiguous axial images were obtained from the base of the skull through the vertex without intravenous contrast. COMPARISON:  CT head 10/02/2020 FINDINGS: Brain: Right frontal ventricular drain extends into the right lateral ventricle. Stable hemorrhage along the catheter tract. Layering hemorrhage in the occipital horns of unchanged. Ventricles mildly larger compared to the prior study. Mild subarachnoid hemorrhage over the right convexity unchanged. Hemorrhage along the left tectum unchanged. No new hemorrhage. No midline shift. Vascular: Negative for hyperdense vessel Skull: No acute abnormality. Sinuses/Orbits: Mild mucosal edema paranasal sinuses. NG tube in place. Negative orbit Other: None IMPRESSION: Ventricle size shows mild interval enlargement since yesterday. No significant hydrocephalus. Intraventricular hemorrhage stable Right frontal ventricular drain unchanged. Hemorrhage surrounding  the drain unchanged. Subarachnoid hemorrhage unchanged. Hemorrhage in the left tectum unchanged. No new hemorrhage identified. Electronically Signed   By: Marlan Palau M.D.   On: 10/03/2020 20:24   CT HEAD WO CONTRAST  Result Date: 10/02/2020 CLINICAL DATA:  Follow-up  examination for cerebral hemorrhage. Desatting. EXAM: CT HEAD WITHOUT CONTRAST TECHNIQUE: Contiguous axial images were obtained from the base of the skull through the vertex without intravenous contrast. COMPARISON:  Prior CT from 10/01/2020. FINDINGS: Brain: Examination mildly degraded by motion artifact. Right parietal approach ventriculostomy catheter remains in place with tip near the septum pellucidum, stable. Subarachnoid and intraparenchymal hemorrhage along the catheter tract is relatively stable. Persistent small focus of pneumocephalus with mild surrounding edema, also similar. Intraventricular blood is decreased from previous, with only small volume hemorrhage now seen within the lateral ventricles. Overall ventricular size is decreased as well, most pronounced at the right lateral ventricle. 1.9 cm hemorrhage at the left dorsal pons is unchanged. Adjacent basilar cisterns remain patent. No other new acute intracranial hemorrhage. No acute large vessel territory infarct. No significant extra-axial collection. No midline shift. Vascular: No appreciable hyperdense vessel on this motion degraded exam. Skull: Right parietal ventriculostomy burr hole with overlying scalp soft tissue swelling. Otherwise negative. Sinuses/Orbits: Globes and orbital soft tissues demonstrate no acute finding. Visualized paranasal sinuses remain largely clear. Is a gastric tube partially visualized. No significant mastoid effusion. Other: None. IMPRESSION: 1. Interval decrease in intraventricular hemorrhage with decreased size of the lateral ventricles as compared to prior, right greater than left. Right parietal approach ventriculostomy in stable position with tip near the septum pellucidum. 2. Stable small volume subarachnoid and intraparenchymal hemorrhage along the catheter tract. 3. Stable 1.9 cm hemorrhage at the left dorsal pons. 4. No other new acute intracranial abnormality. Electronically Signed   By: Rise Mu  M.D.   On: 10/02/2020 03:00   CT HEAD WO CONTRAST  Result Date: 10/01/2020 CLINICAL DATA:  Stroke, follow-up. Status post flushing of ventriculostomy catheter. EXAM: CT HEAD WITHOUT CONTRAST TECHNIQUE: Contiguous axial images were obtained from the base of the skull through the vertex without intravenous contrast. COMPARISON:  CT head without contrast 10/01/2020 at 4:09 a.m. and 09/29/2020. FINDINGS: Brain: New hemorrhage is present within the right lateral ventricle. Small amount layering blood in the left lateral ventricle is stable. New hemorrhage is present in the third ventricle. Tracheostomy catheter is stable in position. Subarachnoid blood over the right convexity is stable. New hemorrhage is present along the ventriculostomy tract. The brainstem hemorrhage is stable. Ventricular size is stable to improved. The study is significantly degraded by patient motion. Technologist reports that the patient was snoring during the exam Vascular: No hyperdense vessel or unexpected calcification. Skull: Calvarium is intact apart from the ventriculostomy burr hole. No focal lytic or blastic lesions are present. Focal scalp soft tissue swelling is present at the ventriculostomy site. Sinuses/Orbits: The paranasal sinuses and mastoid air cells are clear. The globes and orbits are within normal limits. IMPRESSION: 1. New intraventricular hemorrhage within the right lateral ventricle and third ventricle. 2. New hemorrhage along the ventriculostomy tract. 3. Ventriculostomy catheter position is stable. Ventricle size is stable to slightly decreased. 4. Stable brainstem hemorrhage. 5. Stable subarachnoid blood over the right convexity. Critical Value/emergent results were called by telephone at the time of interpretation on 10/01/2020 at 1:27 pm to provider DAVID Yetta Barre , who verbally acknowledged these results. Electronically Signed   By: Marin Roberts M.D.   On: 10/01/2020 13:27   CT  HEAD WO CONTRAST  Result Date:  10/01/2020 CLINICAL DATA:  65 year old male status post code stroke presentation with right side weakness on 09/25/2020, left dorsal midbrain hemorrhage with intraventricular extension. CTA, MRV, and conventional cerebral angiogram unrevealing. EXAM: CT HEAD WITHOUT CONTRAST TECHNIQUE: Contiguous axial images were obtained from the base of the skull through the vertex without intravenous contrast. COMPARISON:  Head CT 09/29/2020 and earlier. FINDINGS: Brain: Right superior frontal approach EVD terminates in the right lateral ventricle near the septum pellucidum. Decreased volume of right intraventricular hemorrhage from 2 days ago. Small volume blood layering in both occipital horns, with some redistribution on the left. Temporal horns are decreased and more normal. Largely resolved 3rd ventricular and 4th ventricular IVH. No transependymal edema. Trace subarachnoid hemorrhage at the right vertex likely secondary to EVD appears stable. Intra-axial hemorrhage centered in the left dorsal midbrain is stable and appears not significantly changed since presentation. Adjacent cerebral aqueduct appears patent. There are small hypodense infarcts in the left cerebellum PICA territory which are new from the MRI 09/25/2020, stable from the most recent CT (series 2, image 6). And similar left superior frontal gyrus cortical and subcortical white matter small infarcts are redemonstrated on series 2, image 30. Stable gray-white matter differentiation elsewhere, edema in the left brainstem. No brand new infarct identified since 09/29/2020. Vascular: Mild intracranial artery tortuosity. Skull: Right superior burr hole.  Otherwise negative. Sinuses/Orbits: Visualized paranasal sinuses and mastoids are stable and well pneumatized. Other: Left nasoenteric tube in place. Right vertex scalp mild postoperative changes related to external ventricular drain. Negative orbits. IMPRESSION: 1. Stable right superior frontal approach EVD with  decreased volume of intraventricular hemorrhage and improved ventricular decompression from 2 days ago. 2. Small infarcts have developed in both the left superior frontal gyrus and the left cerebellum PICA territory since the MRI 09/25/2020, not significantly changed from 09/29/2020. 3. Left dorsal midbrain intra-axial hemorrhage has not significantly changed since presentation. Associated brainstem edema. 4. Stable trace subarachnoid hemorrhage is stable and may be related to EVD placement. Electronically Signed   By: Odessa Fleming M.D.   On: 10/01/2020 05:11   CT HEAD WO CONTRAST  Result Date: 09/29/2020 CLINICAL DATA:  Follow-up intracranial hemorrhage EXAM: CT HEAD WITHOUT CONTRAST TECHNIQUE: Contiguous axial images were obtained from the base of the skull through the vertex without intravenous contrast. COMPARISON:  09/26/2020 FINDINGS: Brain: New right frontal external ventricular drain which traverses the body of the right lateral ventricle. Increase in intraventricular blood at the level of the third and lateral ventricles. Hemorrhage encompasses the lower catheter at the level of the parenchyma and also encompasses the intraventricular portion of the catheter. Small volume subarachnoid hemorrhage at the right vertex. The parenchymal hemorrhage in the left posterior midbrain, just left of the cerebral aqua duct, is unchanged. Unchanged ventriculomegaly. Small cortically based infarcts have become apparent along the superior left frontal convexity. Vascular: No hyperdense vessel or unexpected calcification. Skull: Normal. Negative for fracture or focal lesion. Sinuses/Orbits: No acute finding. IMPRESSION: 1. New intraventricular hemorrhage in the third and lateral ventricles. Fourth ventricular and midbrain hemorrhage is unchanged. 2. New right frontal EVD which traverses the right lateral ventricle. Blood clot encompasses the lower catheter and ventriculomegaly is similar to prior. 3. Small cortical infarcts  are newly seen along the superior left frontal convexity. Electronically Signed   By: Marnee Spring M.D.   On: 09/29/2020 04:28   CT CHEST W CONTRAST  Result Date: 10/17/2020 CLINICAL DATA:  Fever of unknown origin.  EXAM: CT CHEST, ABDOMEN, AND PELVIS WITH CONTRAST TECHNIQUE: Multidetector CT imaging of the chest, abdomen and pelvis was performed following the standard protocol during bolus administration of intravenous contrast. CONTRAST:  OMNIPAQUE IOHEXOL 300 MG/ML  SOLN COMPARISON:  Plain film 10/17/2020.  No prior CTs. FINDINGS: CT CHEST FINDINGS Cardiovascular: Aortic atherosclerosis. Normal heart size, without pericardial effusion. Bilateral pulmonary emboli are identified on this non dedicated exam. Example in the central right pulmonary artery on 28/3. Branch pulmonary arteries to both upper and lower lobes on 32/3. Within the left upper lobe pulmonary artery branch on 20/3. Mediastinum/Nodes: No mediastinal or hilar adenopathy. Lungs/Pleura: No pleural fluid. Endotracheal tube terminates appropriately. Areas of mild peripheral predominant airspace and ground-glass are indeterminate. Example at the left apex on 22/5 and right lower lobe on 58/5. More dependent bibasilar airspace opacities are likely due to atelectasis. Musculoskeletal: No acute osseous abnormality. CT ABDOMEN PELVIS FINDINGS Hepatobiliary: Mild hepatomegaly at 18.4 cm craniocaudal. Normal gallbladder, without biliary ductal dilatation. Pancreas: Normal, without mass or ductal dilatation. Spleen: Normal in size, without focal abnormality. Adrenals/Urinary Tract: Normal adrenal glands. Bilateral renal sinus cysts, without hydronephrosis. Foley catheter within the urinary bladder. Stomach/Bowel: Nasogastric tube terminating at the gastric antrum. Rectal catheter in place. Scattered colonic diverticula. Normal terminal ileum and appendix. Normal small bowel. Vascular/Lymphatic: Aortic atherosclerosis. IVC filter is appropriately  positioned. No abdominopelvic adenopathy. Reproductive: Mild prostatomegaly. Other: No significant free fluid. No free intraperitoneal air. Mild pelvic anasarca. tiny bilateral fat containing inguinal hernias. Musculoskeletal: Degenerative partial fusion of bilateral sacroiliac joints in. Degenerate disc disease at the lumbosacral junction. IMPRESSION: 1. Bilateral, large volume right greater than left pulmonary emboli on this nondedicated exam. No evidence of right heart strain. 2. Mild peripheral ground-glass opacities, favoring atelectasis or infarct. Atypical infection felt less likely. 3. Otherwise, no explanation for fever. 4.  Aortic Atherosclerosis (ICD10-I70.0). 5. Prostatomegaly. These results will be called to the ordering clinician or representative by the Radiologist Assistant, and communication documented in the PACS or [employer]. Electronically Signed   By: Jeronimo Greaves M.D.   On: 10/17/2020 14:43   MR BRAIN WO CONTRAST  Result Date: 10/06/2020 CLINICAL DATA:  Cerebral hemorrhage suspected. EXAM: MRI HEAD WITHOUT CONTRAST TECHNIQUE: Multiplanar, multiecho pulse sequences of the brain and surrounding structures were obtained without intravenous contrast. COMPARISON:  Head CT October 06, 2020 FINDINGS: Brain: Similar appearance of intraventricular hemorrhage as well as hemorrhage along prior EVD tract in the right frontal lobe and scattered cortical subarachnoid hemorrhage. Ventricular size remains stable. There is also no significant change in size of the intraparenchymal hemorrhage centered within the left side of the midbrain extending into the thalamic pulvinar and superior cerebellar peduncle. A few small foci of restricted diffusion within left frontal and parietal lobes, corresponding to small acute/subacute infarcts. Vascular: Normal flow voids. Skull and upper cervical spine: Ines Bloomer hole from prior EVD in the right frontal region. Marrow signal characteristics are otherwise maintained.  Sinuses/Orbits: Mucous retention cyst in the bilateral maxillary sinuses and trace mucosal thickening throughout the paranasal sinuses. The orbits are maintained. Other: Bilateral mastoid effusion. IMPRESSION: 1. Similar appearance of intraventricular hemorrhage as well as hemorrhage along prior EVD tract in the right frontal lobe and scattered cortical subarachnoid hemorrhage. 2. Ventricular size remains stable. 3. No significant change in size of intraparenchymal hemorrhage centered within the left side of the midbrain extending into the thalamic pulvinar and superior cerebellar peduncle. 4. A few small foci of restricted diffusion within the left frontal and parietal  lobes, corresponding to small acute/subacute infarcts. Electronically Signed   By: Baldemar Lenis M.D.   On: 10/06/2020 17:06   CT ABDOMEN PELVIS W CONTRAST  Result Date: 10/17/2020 CLINICAL DATA:  Fever of unknown origin. EXAM: CT CHEST, ABDOMEN, AND PELVIS WITH CONTRAST TECHNIQUE: Multidetector CT imaging of the chest, abdomen and pelvis was performed following the standard protocol during bolus administration of intravenous contrast. CONTRAST:  OMNIPAQUE IOHEXOL 300 MG/ML  SOLN COMPARISON:  Plain film 10/17/2020.  No prior CTs. FINDINGS: CT CHEST FINDINGS Cardiovascular: Aortic atherosclerosis. Normal heart size, without pericardial effusion. Bilateral pulmonary emboli are identified on this non dedicated exam. Example in the central right pulmonary artery on 28/3. Branch pulmonary arteries to both upper and lower lobes on 32/3. Within the left upper lobe pulmonary artery branch on 20/3. Mediastinum/Nodes: No mediastinal or hilar adenopathy. Lungs/Pleura: No pleural fluid. Endotracheal tube terminates appropriately. Areas of mild peripheral predominant airspace and ground-glass are indeterminate. Example at the left apex on 22/5 and right lower lobe on 58/5. More dependent bibasilar airspace opacities are likely due to  atelectasis. Musculoskeletal: No acute osseous abnormality. CT ABDOMEN PELVIS FINDINGS Hepatobiliary: Mild hepatomegaly at 18.4 cm craniocaudal. Normal gallbladder, without biliary ductal dilatation. Pancreas: Normal, without mass or ductal dilatation. Spleen: Normal in size, without focal abnormality. Adrenals/Urinary Tract: Normal adrenal glands. Bilateral renal sinus cysts, without hydronephrosis. Foley catheter within the urinary bladder. Stomach/Bowel: Nasogastric tube terminating at the gastric antrum. Rectal catheter in place. Scattered colonic diverticula. Normal terminal ileum and appendix. Normal small bowel. Vascular/Lymphatic: Aortic atherosclerosis. IVC filter is appropriately positioned. No abdominopelvic adenopathy. Reproductive: Mild prostatomegaly. Other: No significant free fluid. No free intraperitoneal air. Mild pelvic anasarca. tiny bilateral fat containing inguinal hernias. Musculoskeletal: Degenerative partial fusion of bilateral sacroiliac joints in. Degenerate disc disease at the lumbosacral junction. IMPRESSION: 1. Bilateral, large volume right greater than left pulmonary emboli on this nondedicated exam. No evidence of right heart strain. 2. Mild peripheral ground-glass opacities, favoring atelectasis or infarct. Atypical infection felt less likely. 3. Otherwise, no explanation for fever. 4.  Aortic Atherosclerosis (ICD10-I70.0). 5. Prostatomegaly. These results will be called to the ordering clinician or representative by the Radiologist Assistant, and communication documented in the PACS or [employer]. Electronically Signed   By: Jeronimo Greaves M.D.   On: 10/17/2020 14:43   IR IVC FILTER PLMT / S&I Lenise Arena GUID/MOD SED  Result Date: 10/08/2020 CLINICAL DATA:  65 year old male with intracranial hemorrhage and bilateral lower extremity deep vein thrombosis. EXAM: 1. ULTRASOUND GUIDANCE FOR VASCULAR ACCESS OF THE RIGHT internal jugular VEIN. 2. IVC VENOGRAM. 3. PERCUTANEOUS IVC FILTER  PLACEMENT. ANESTHESIA/SEDATION: The patient was receiving sedation as per the ICU. CONTRAST:  35 mL Omnipaque 300 FLUOROSCOPY TIME:  54 seconds, 59 mGy PROCEDURE: The procedure, risks, benefits, and alternatives were explained to the patient. Questions regarding the procedure were encouraged and answered. The patient understands and consents to the procedure. The patient was prepped with Betadine in a sterile fashion, and a sterile drape was applied covering the operative field. A sterile gown and sterile gloves were used for the procedure. Local anesthesia was provided with 1% Lidocaine. Under direct ultrasound guidance, a 21 gauge needle was advanced into the right internal jugular vein with ultrasound image documentation performed. After securing access with a micropuncture dilator, a guidewire was advanced into the inferior vena cava. A deployment sheath was advanced over the guidewire. This was utilized to perform IVC venography. The deployment sheath was further positioned in an  appropriate location for filter deployment. A Denali IVC filter was then advanced in the sheath. This was then fully deployed in the infrarenal IVC. Final filter position was confirmed with a fluoroscopic spot image. Contrast injection was also performed through the sheath under fluoroscopy to confirm patency of the IVC at the level of the filter. After the procedure the sheath was removed and hemostasis obtained with manual compression. COMPLICATIONS: None. FINDINGS: IVC venography demonstrates a normal caliber IVC with no evidence of thrombus. Renal veins are identified bilaterally. The IVC filter was successfully positioned below the level of the renal veins and is appropriately oriented. This IVC filter has both permanent and retrievable indications. IMPRESSION: Placement of percutaneous IVC filter in infrarenal IVC. IVC venogram shows no evidence of IVC thrombus and normal caliber of the inferior vena cava. This filter does have  both permanent and retrievable indications. PLAN: This IVC filter is potentially retrievable. The patient will be assessed for filter retrieval by Interventional Radiology in approximately 8-12 weeks. Further recommendations regarding filter retrieval, continued surveillance or declaration of device permanence, will be made at that time. Marliss Coots, MD [facility] Radiology Electronically Signed   By: Marliss Coots MD   On: 10/08/2020 15:08   VAS [place] IVC/ILIAC (VENOUS ONLY)  Result Date: 10/25/2020 IVC/ILIAC STUDY Patient Name:  Walter [name]  Date of Exam:   10/25/2020 Medical Rec #: [id number]        Accession #:    [id number] Date of Birth: 1955-11-13        Patient Gender: M Patient Age:   52Y Exam Location:  [facility] Procedure:      VAS [place] IVC/ILIAC (VENOUS ONLY) Referring Phys: [id number] [facility] --------------------------------------------------------------------------------  Indications: Extensive right lower extremity DVT Limitations: Air/bowel gas, patient unable to cooperate and obesity.  Comparison Study: No prior study Performing Technologist: Gertie Fey MHA, RDMS, RVT, RDCS  Examination Guidelines: A complete evaluation includes B-mode imaging, spectral Doppler, color Doppler, and power Doppler as needed of all accessible portions of each vessel. Bilateral testing is considered an integral part of a complete examination. Limited examinations for reoccurring indications may be performed as noted.  IVC/Iliac Findings: +----------+------+--------+--------+    IVC    PatentThrombusComments +----------+------+--------+--------+ IVC Prox         acute           +----------+------+--------+--------+ IVC Mid          acute           +----------+------+--------+--------+ IVC Distal       acute           +----------+------+--------+--------+  +-------------------+---------+-----------+---------+-----------+--------+          CIV        RT-PatentRT-ThrombusLT-PatentLT-ThrombusComments +-------------------+---------+-----------+---------+-----------+--------+ Common Iliac Distal            acute    patent                      +-------------------+---------+-----------+---------+-----------+--------+  +-------------------------+---------+-----------+---------+-----------+--------+            EIV           RT-PatentRT-ThrombusLT-PatentLT-ThrombusComments +-------------------------+---------+-----------+---------+-----------+--------+ External Iliac Vein                  acute    patent                      Distal                                                                    +-------------------------+---------+-----------+---------+-----------+--------+  Summary: IVC/Iliac: There is evidence of acute thrombus involving the IVC. There is no evidence of thrombus involving the left common iliac vein. There is evidence of acute thrombus involving the right common iliac vein. There is no evidence of thrombus involving  the left external iliac vein. There is evidence of acute thrombus involving the right external iliac vein.  *See table(s) above for measurements and observations.  Electronically signed by Coral Else MD on 10/25/2020 at 6:16:09 PM.    Final    DG CHEST PORT 1 VIEW  Result Date: 10/18/2020 CLINICAL DATA:  Fever. EXAM: PORTABLE CHEST 1 VIEW COMPARISON:  CT 10/17/2020.  Chest x-ray 10/17/2020. FINDINGS: Tracheostomy tube and feeding tube are in stable position. Heart size normal. Low lung volumes. Mild right base subsegment atelectasis/infiltrate. No pleural effusion or pneumothorax. IVC filter noted over the right upper abdomen. Degenerative change thoracic spine. IMPRESSION: 1. Lines and tubes stable position. 2. Mild right base subsegmental atelectasis/infiltrate. Electronically Signed   By: Maisie Fus  Register   On: 10/18/2020 06:04   DG CHEST PORT 1 VIEW  Result Date:  10/17/2020 CLINICAL DATA:  Fever. EXAM: PORTABLE CHEST 1 VIEW COMPARISON:  10/06/2020 FINDINGS: A tracheostomy tube remains in place and overlies the airway. A feeding tube courses into the abdomen with tip not imaged. The cardiomediastinal silhouette is unchanged with normal heart size. Lung aeration has improved with minimal residual opacities in the left greater than right lung bases. No edema, sizable pleural effusion, pneumothorax is identified. IMPRESSION: Improved lung aeration with minimal residual bibasilar opacities likely reflecting atelectasis. Electronically Signed   By: Sebastian Ache M.D.   On: 10/17/2020 10:33   DG CHEST PORT 1 VIEW  Result Date: 10/06/2020 CLINICAL DATA:  Fever.  Trach present. EXAM: PORTABLE CHEST 1 VIEW COMPARISON:  October 05, 2020. FINDINGS: Slightly improved aeration of the lungs. Improved left basilar opacities. Right basilar opacities appear to have resolved. No visible pleural effusions or pneumothorax. Tracheostomy tube projects midline with the tip at the level of the clavicular heads. Cardiomediastinal silhouette is within normal limits and similar to prior. Calcific atherosclerosis of the aorta. IMPRESSION: Slightly improved lung aeration and left basilar opacities, which could represent atelectasis, aspiration, and/or pneumonia. Electronically Signed   By: Feliberto Harts MD   On: 10/06/2020 11:05   DG CHEST PORT 1 VIEW  Result Date: 10/05/2020 CLINICAL DATA:  Fever. EXAM: PORTABLE CHEST 1 VIEW COMPARISON:  Chest x-ray 10/04/2020. FINDINGS: Tracheostomy tube, feeding tube, right PICC line noted stable position. Cardiomegaly. Mild pulmonary vascular prominence. Mild bibasilar atelectasis/infiltrates. No pleural effusion or pneumothorax. IMPRESSION: 1. Tracheostomy tube, feeding tube, right PICC line stable position. 2.  Low lung volumes with bibasilar atelectasis/infiltrates. Electronically Signed   By: Maisie Fus  Register   On: 10/05/2020 11:04   DG CHEST PORT 1  VIEW  Result Date: 10/04/2020 CLINICAL DATA:  Status post tracheostomy. EXAM: PORTABLE CHEST 1 VIEW COMPARISON:  October 02, 2020. FINDINGS: Stable cardiomediastinal silhouette. Tracheostomy tube is in grossly good position. Feeding tube is noted. Right-sided PICC line is unchanged. No pneumothorax is noted. No pleural effusion is noted. Lungs are clear. Bony thorax is unremarkable. IMPRESSION: Tracheostomy tube in grossly good position. Electronically Signed   By: Lupita Raider M.D.   On: 10/04/2020 14:29   DG CHEST PORT 1 VIEW  Result Date: 10/02/2020 CLINICAL DATA:  Intubation EXAM: PORTABLE CHEST 1 VIEW COMPARISON:  10/01/2020 FINDINGS: The endotracheal tube terminates above the carina. The enteric tube extends below the left hemidiaphragm. The  lung volumes are improved. There is improved aeration at the right lung base with persistent streaky airspace opacities favored to represent atelectasis. There is no pneumothorax. No large pleural effusion. IMPRESSION: 1. Lines and tubes as above. 2. Improving lung volumes. Electronically Signed   By: Katherine Mantle M.D.   On: 10/02/2020 02:25   DG CHEST PORT 1 VIEW  Result Date: 10/02/2020 CLINICAL DATA:  Endotracheal intubation. EXAM: PORTABLE CHEST 1 VIEW COMPARISON:  10/01/2020 FINDINGS: Endotracheal tube tip is in the cervical trachea, measuring about 11.7 cm above the carina. Suggest advancement. Enteric tube is present with tip in the left upper quadrant consistent with location in the upper stomach. Right PICC line with tip over the low SVC region. Heart size and pulmonary vascularity are normal. Shallow inspiration. Infiltration or atelectasis in the right lung base. No pleural effusions. No pneumothorax. IMPRESSION: 1. Endotracheal tube tip is in the cervical trachea. Suggest advancement. 2. Shallow inspiration with infiltration or atelectasis in the right lung base. Electronically Signed   By: Burman Nieves M.D.   On: 10/02/2020 02:12   DG CHEST  PORT 1 VIEW  Result Date: 10/01/2020 CLINICAL DATA:  PICC line placement EXAM: PORTABLE CHEST 1 VIEW COMPARISON:  09/29/2020 FINDINGS: Placement of a RIGHT PICC line tip in distal SVC. Feeding extends in the stomach. Low lung volumes unchanged. Basilar atelectasis. IMPRESSION: Introduction of RIGHT PICC line. Electronically Signed   By: Genevive Bi M.D.   On: 10/01/2020 17:38   DG CHEST PORT 1 VIEW  Result Date: 09/29/2020 CLINICAL DATA:  Acute respiratory failure EXAM: PORTABLE CHEST 1 VIEW COMPARISON:  Three days ago FINDINGS: Feeding tube which at least reaches the stomach. Lucency over the left upper quadrant is presumably stomach. Cardiomegaly. Congested appearance of central vessels. No Kerley lines, effusion, or pneumothorax. IMPRESSION: Low volume chest which accentuates cardiomegaly and vascular congestion. Electronically Signed   By: Marnee Spring M.D.   On: 09/29/2020 09:44   DG Swallowing Func-Speech Pathology  Result Date: 10/20/2020 Objective Swallowing Evaluation: Type of Study: MBS-Modified Barium Swallow Study  Patient Details Name: Walter [name] MRN: [id number] Date of Birth: 1955/07/16 Today's Date: 10/20/2020 Time: SLP Start Time (ACUTE ONLY): 0755 -SLP Stop Time (ACUTE ONLY): 0820 SLP Time Calculation (min) (ACUTE ONLY): 25 min Past Medical History: Past Medical History: Diagnosis Date . Atrial flutter (HCC)  . Dizzy 09/25/13 . Near syncope 09/25/13 Past Surgical History: Past Surgical History: Procedure Laterality Date . A-FLUTTER ABLATION N/A 03/09/2020  Procedure: A-FLUTTER ABLATION;  Surgeon: Hillis Range, MD;  Location: [facility] INVASIVE CV LAB;  Service: Cardiovascular;  Laterality: N/A; . CARDIOVERSION N/A 09/27/2013  Procedure: CARDIOVERSION;  Surgeon: Lewayne Bunting, MD;  Location[facility] ENDOSCOPY;  Service: Cardiovascular;  Laterality: N/A; . IR ANGIO EXTERNAL CAROTID SEL EXT CAROTID UNI R MOD SED  09/26/2020 . IR ANGIO INTRA EXTRACRAN SEL COM CAROTID INNOMINATE BILAT MOD SED   09/26/2020 . IR ANGIO VERTEBRAL SEL VERTEBRAL UNI R MOD SED  09/26/2020 . IR IVC FILTER PLMT / S&I /IMG GUID/MOD SED  10/08/2020 . IR US GUIDE VASC ACCESS RIGHT  09/26/2020 . RETINAL DETACHMENT SURGERY   . right knee arthroscopy   . TEE WITHOUT CARDIOVERSION N/A 09/27/2013  Procedure: TRANSESOPHAGEAL ECHOCARDIOGRAM (TEE);  Surgeon: Lewayne Bunting, MD;  Location: [facility] ENDOSCOPY;  Service: Cardiovascular;  Laterality: N/A; . WRIST SURGERY   HPI: 65 yo male presenting 3/28 with R-sided weakness and slurred speech. Imaging revealed acute hemorrhage centered within the left dorsal midbrain extending into the  dorsal pons and inferomedial thalamus with intraventricular extension. EVD placement on 3/29. Extubated 3/30. Decrease in EVD drainage 3/31. Repeat CT shows significant clot in R lateral ventricle. EVD clotted 4/2 and intraventricular TpA administered. Reintubated 4/4. Tracheostomy 4/6. PMH Afib, COVID infection (July 2021), and retinal detachment.  Recent CXR showed. Mild right base subsegmental  atelectasis/infiltrate.  Subjective: sleepy but did surprisingly awake enough to participate Assessment / Plan / Recommendation CHL IP CLINICAL IMPRESSIONS 10/20/2020 Clinical Impression Patient was tested with PMSV in place.  He presents with moderate oral and funcitonal pharyngeal swallow ability.  He was sleeping in chair prior to MBS but did wake adequately to accept po intake and held cup with left hand and brought to mouth.  Lingual weakness and discoordination due to hypoglossal nerve deficit results in premature spillage of liquids and delayed oral transiting with puree/cracker bolus.  Pt's pharyngeal swallow triggers at vallecula with boluses and was overall strong. NO aspiration or penetration observed. Pt masticated tablet despite cues to swallow whole - due to his cognition.  Largest aspiration/malnutrition risk is due to oral deficits and his lethargy. Encourage calorie count to assure he is meeting nutritional needs  and assess for oral retention after po.  Will follow up for dysphagia, dysarthria and cognitive linguistic treatment. SLP Visit Diagnosis Dysphagia, oral phase (R13.11) Attention and concentration deficit following -- Frontal lobe and executive function deficit following -- Impact on safety and function Moderate aspiration risk;Risk for inadequate nutrition/hydration   CHL IP TREATMENT RECOMMENDATION 10/20/2020 Treatment Recommendations Therapy as outlined in treatment plan below   Prognosis 10/20/2020 Prognosis for Safe Diet Advancement Good Barriers to Reach Goals -- Barriers/Prognosis Comment -- CHL IP DIET RECOMMENDATION 10/20/2020 SLP Diet Recommendations Dysphagia 1 (Puree) solids;Thin liquid Liquid Administration via Straw;Cup Medication Administration Crushed with puree Compensations Slow rate;Small sips/bites Postural Changes Remain semi-upright after after feeds/meals (Comment);Seated upright at 90 degrees   CHL IP OTHER RECOMMENDATIONS 10/20/2020 Recommended Consults -- Oral Care Recommendations Oral care QID Other Recommendations Have oral suction available;Place PMSV during PO intake;Clarify dietary restrictions   CHL IP FOLLOW UP RECOMMENDATIONS 10/20/2020 Follow up Recommendations Inpatient Rehab   CHL IP FREQUENCY AND DURATION 10/20/2020 Speech Therapy Frequency (ACUTE ONLY) min 2x/week Treatment Duration 2 weeks      CHL IP ORAL PHASE 10/20/2020 Oral Phase Impaired Oral - Pudding Teaspoon -- Oral - Pudding Cup -- Oral - Honey Teaspoon -- Oral - Honey Cup -- Oral - Nectar Teaspoon Weak lingual manipulation;Premature spillage;Delayed oral transit Oral - Nectar Cup -- Oral - Nectar Straw Weak lingual manipulation Oral - Thin Teaspoon Weak lingual manipulation;Premature spillage Oral - Thin Cup Premature spillage;Weak lingual manipulation Oral - Thin Straw Weak lingual manipulation;Premature spillage Oral - Puree Lingual pumping;Premature spillage;Delayed oral transit Oral - Mech Soft Lingual pumping;Delayed  oral transit;Impaired mastication;Premature spillage Oral - Regular -- Oral - Multi-Consistency -- Oral - Pill Other (Comment);Lingual pumping;Weak lingual manipulation;Delayed oral transit Oral Phase - Comment pt masticated tablet  CHL IP PHARYNGEAL PHASE 10/20/2020 Pharyngeal Phase WFL Pharyngeal- Pudding Teaspoon -- Pharyngeal -- Pharyngeal- Pudding Cup -- Pharyngeal -- Pharyngeal- Honey Teaspoon -- Pharyngeal -- Pharyngeal- Honey Cup -- Pharyngeal -- Pharyngeal- Nectar Teaspoon -- Pharyngeal -- Pharyngeal- Nectar Cup -- Pharyngeal -- Pharyngeal- Nectar Straw -- Pharyngeal -- Pharyngeal- Thin Teaspoon -- Pharyngeal -- Pharyngeal- Thin Cup -- Pharyngeal -- Pharyngeal- Thin Straw -- Pharyngeal -- Pharyngeal- Puree -- Pharyngeal -- Pharyngeal- Mechanical Soft -- Pharyngeal -- Pharyngeal- Regular -- Pharyngeal -- Pharyngeal- Multi-consistency -- Pharyngeal -- Pharyngeal- Pill --  Pharyngeal -- Pharyngeal Comment --  CHL IP CERVICAL ESOPHAGEAL PHASE 10/20/2020 Cervical Esophageal Phase WFL Pudding Teaspoon -- Pudding Cup -- Honey Teaspoon -- Honey Cup -- Nectar Teaspoon -- Nectar Cup -- Nectar Straw -- Thin Teaspoon -- Thin Cup -- Thin Straw -- Puree -- Mechanical Soft -- Regular -- Multi-consistency -- Pill -- Cervical Esophageal Comment -- Rolena Infante, MS [facility] Acute Rehab Services Office [phone] Pager [phone] Walter [name] 10/20/2020, 9:02 AM              EEG adult  Result Date: 10/13/2020 [name], MD     10/13/2020 12:24 PM Patient Name: Walter [name] MRN: [id number] Epilepsy Attending: [name] Referring Physician/Provider: Dr. Marvel Plan Date: 10/13/2020 Duration: 23.47 minutes Patient history: 63 old male with left upper extremity shaking. EEG done for seizures. Level of alertness: Awake AEDs during EEG study: None Technical aspects: This EEG study was done with scalp electrodes positioned according to the 10-20 International system of electrode placement. Electrical activity  was acquired at a sampling rate of 500Hz  and reviewed with a high frequency filter of 70Hz  and a low frequency filter of 1Hz . EEG data were recorded continuously and digitally stored. Description: No clear posterior dominant rhythm was seen.  EEG showed continuous generalized 6 to 8 Hz theta-alpha activity as well as intermittent generalized sharply contoured 2 to 3 Hz delta slowing.  Hyperventilation and photic stimulation were not performed.   ABNORMALITY -Continuous slow, generalized IMPRESSION: This study is suggestive of moderate diffuse encephalopathy, nonspecific etiology. No seizures or definite epileptiform discharges were seen throughout the recording.   EEG adult  Result Date: 10/02/2020 , MD     10/02/2020  2:25 PM Patient Name: Walter [name] MRN: [name] Epilepsy Attending: 12/02/2020 Referring Physician/Provider: Dr. 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  EEG to evaluate for seizures. Level of alertness: Comatose AEDs during EEG study: None Technical aspects: This EEG study was done with scalp electrodes positioned according to the 10-20 International system of electrode placement. Electrical activity was acquired at a sampling rate of 500Hz  and reviewed with a high frequency filter of 70Hz  and a low frequency filter of 1Hz . EEG data were recorded continuously and digitally stored. Description: EEG showed continuous generalized and maximal right temporal region polymorphic sharply contoured 3 to 6 Hz theta-delta slowing.  Hyperventilation and photic stimulation were not performed.   ABNORMALITY -Continuous slow, generalized and maximal right temporal region. IMPRESSION: This study is suggestive of cortical dysfunction in right temporal region likely secondary to underlying structural abnormality as well as moderate to  severe diffuse encephalopathy, nonspecific etiology.  No seizures or epileptiform discharges were seen throughout the recording. Priyanka Marvel Plan   Overnight EEG with video  Result Date: 10/04/2020 76, MD     10/04/2020  8:46 AM Patient Name: Walter [name] MRN: Epilepsy Attending: Annabelle Harman Referring Physician/Provider: Dr 12/04/2020 Duration: 10/03/2020 2139 to 10/04/2020 0730 Patient history: 82 old male with left upper extremity shaking.  EEG done for seizures. Level of alertness:  lethargic AEDs during EEG study: [place] Technical aspects: This EEG study was done with scalp electrodes positioned according to the 10-20 International system of electrode placement. Electrical activity was acquired at a sampling rate of 500Hz  and reviewed with a high frequency filter of 70Hz  and a low frequency filter  of 1Hz . EEG data were recorded continuously and digitally stored. Description: No clear posterior dominant rhythm was seen. EEG showed continuous generalized 3 to 6 Hz theta-delta slowing.  Intermittent generalized periodic discharges with triphasic morphology were noted at 1.5 to 2.5 Hz.  Hyperventilation and photic stimulation were not performed.   ABNORMALITY -Continuous slow, generalized -Periodic discharges with triphasic morphology, generalized IMPRESSION: This study showed periodic discharges with triphasic morphology at 1.5 to 2.5 Hz which is on the ictal-interictal continuum with low to intermediate potential for seizures.  Additionally there is evidence of moderate to severe diffuse encephalopathy, nonspecific etiology.  No seizures were seen during the study.   CT VENOGRAM ABD/PEL  Result Date: 10/27/2020 CLINICAL DATA:  65 year old with bilateral lower extremity DVT. Evaluate for IVC involvement. EXAM: CT VENOGRAM ABDOMEN AND PELVIS TECHNIQUE: Multidetector CT imaging of the abdomen and pelvis was performed using the standard protocol during bolus  administration of intravenous contrast. Multiplanar reconstructed images and MIPs were obtained and reviewed to evaluate the vascular anatomy. CONTRAST:  76 OMNIPAQUE IOHEXOL 350 MG/ML SOLN COMPARISON:  10/17/2020 FINDINGS: VASCULAR Arterial: Normal caliber of the abdominal aorta with atherosclerotic disease. No evidence for aortic aneurysm. Although this is not a CTA examination, there is flow in the celiac trunk, SMA, bilateral renal arteries and IMA. Bilateral iliac arteries and proximal femoral arteries are patent bilaterally. Venous: IVC filter positioned just below the renal veins. The infrarenal IVC is expanded with a large amount of thrombus. Greater than 70% of the infrarenal IVC contains thrombus. There is thrombus within the filter that probably extends up to the filter retrieval hook. Bilateral renal veins are patent. Suprarenal IVC is patent. Left common and external iliac veins are patent without definite thrombus. There appears to be some nonocclusive thrombus involving the distal left common femoral vein. There is clot at the IVC bifurcation. No significant clot in the right common iliac vein but there is expansile nonocclusive thrombus in the right external iliac vein. There is thrombus involving the proximal right femoral veins including the profunda femoral vein and the proximal femoral veins. 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 Main portal venous system is patent. Review of the MIP images confirms the above findings. NON-VASCULAR Lower chest: Small right pleural effusion is new. Probable trace left pleural fluid. Compressive atelectasis at both lung bases. Hepatobiliary: Normal appearance of the and gallbladder. No biliary dilatation. Pancreas: Unremarkable. No pancreatic ductal dilatation or surrounding inflammatory changes. Spleen: Normal in size without focal abnormality. Adrenals/Urinary Tract: Normal adrenal glands. Bilateral parapelvic renal cysts. No  hydronephrosis. Moderate amount of fluid in the urinary bladder. Stomach/Bowel: Orogastric feeding tube that terminates near the duodenal bulb. No evidence for bowel dilatation or focal inflammation. Multiple diverticula involving the sigmoid colon. Appendix has a normal appearance and contains oral contrast. Lymphatic: No abdominal or pelvic lymphadenopathy. Reproductive: No acute abnormality to the prostate. Other: Increased presacral edema. Subcutaneous edema. No significant free fluid in the abdomen or pelvis. Negative for free air. Small umbilical hernia containing fat. Musculoskeletal: Disc space narrowing at L5-S1. No acute bone abnormality. IMPRESSION: VASCULAR 1. Positive for thrombus involving the IVC and iliac veins. Large amount of thrombus involving the infrarenal IVC and IVC filter. Suprarenal IVC is patent. Thrombus in the right iliac veins and thrombus in the proximal femoral veins bilaterally. 2.  Aortic Atherosclerosis (ICD10-I70.0). NON-VASCULAR 1. New small right pleural effusion. Compressive atelectasis at the lung bases. 2. Subcutaneous edema with increased presacral edema.  3. Bilateral parapelvic renal cysts without hydronephrosis. 4. Colonic diverticulosis without acute colonic inflammation. Electronically Signed   By: Richarda Overlie M.D.   On: 10/27/2020 09:55   VAS [place] LOWER EXTREMITY VENOUS (DVT)  Result Date: 10/25/2020  Lower Venous DVT Study Patient Name:  Walter [name]  Date of Exam:   10/25/2020 Medical Rec #: [id number]        Accession #:    [id number] Date of Birth: 1955-09-21        Patient Gender: M Patient Age:   65Y Exam Location:  [facility] Procedure:      VAS [place] LOWER EXTREMITY VENOUS (DVT) Referring Phys: [id number] [facility] [place] --------------------------------------------------------------------------------  Indications: Recent right lower extremity DVT with worsening right lower extremity edema.  Limitations: Patient unable to cooperate. Comparison Study:  10/08/2020 lower extremity venous duplex- extensive right lower                   extremity acute DVT Performing Technologist: Gertie Fey MHA, RDMS, RVT, RDCS  Examination Guidelines: A complete evaluation includes B-mode imaging, spectral Doppler, color Doppler, and power Doppler as needed of all accessible portions of each vessel. Bilateral testing is considered an integral part of a complete examination. Limited examinations for reoccurring indications may be performed as noted. The reflux portion of the exam is performed with the patient in reverse Trendelenburg.  +---------+---------------+---------+-----------+----------+--------------+ RIGHT    CompressibilityPhasicitySpontaneityPropertiesThrombus Aging +---------+---------------+---------+-----------+----------+--------------+ CFV      None                    No                   Acute          +---------+---------------+---------+-----------+----------+--------------+ SFJ      None                                         Acute          +---------+---------------+---------+-----------+----------+--------------+ FV Prox  None                                         Acute          +---------+---------------+---------+-----------+----------+--------------+ FV Mid   None                                         Acute          +---------+---------------+---------+-----------+----------+--------------+ FV DistalNone                                         Acute          +---------+---------------+---------+-----------+----------+--------------+ PFV      None                                         Acute          +---------+---------------+---------+-----------+----------+--------------+ POP      None  No                   Acute          +---------+---------------+---------+-----------+----------+--------------+ PTV      None                                         Acute           +---------+---------------+---------+-----------+----------+--------------+ PERO     None                                         Acute          +---------+---------------+---------+-----------+----------+--------------+ Soleal   None                                         Acute          +---------+---------------+---------+-----------+----------+--------------+ Gastroc  None                                         Acute          +---------+---------------+---------+-----------+----------+--------------+   +----+---------------+---------+-----------+----------+-----------------+ LEFTCompressibilityPhasicitySpontaneityPropertiesThrombus Aging    +----+---------------+---------+-----------+----------+-----------------+ CFV Partial                 Yes                  Age Indeterminate +----+---------------+---------+-----------+----------+-----------------+     Summary: RIGHT: - Findings consistent with acute deep vein thrombosis involving the right common femoral vein, SF junction, right femoral vein, right proximal profunda vein, right popliteal vein, right posterior tibial veins, right peroneal veins, right soleal veins, and right gastrocnemius veins. - No cystic structure found in the popliteal fossa.  LEFT: - Findings consistent with age indeterminate deep vein thrombosis involving the left common femoral vein.  *See table(s) above for measurements and observations. Electronically signed by Coral Else MD on 10/25/2020 at 6:15:59 PM.    Final    VAS [place] LOWER EXTREMITY VENOUS (DVT)  Result Date: 10/08/2020  Lower Venous DVT Study Indications: Fever.  Risk Factors: Immobility Surgery Ventriculostomy post hemorrhagic CVA Recent CVA, Afib (not on blood thinner prior to admission). Comparison Study: No previous exams Performing Technologist: Ernestene Mention  Examination Guidelines: A complete evaluation includes B-mode imaging, spectral Doppler, color Doppler, and power Doppler as  needed of all accessible portions of each vessel. Bilateral testing is considered an integral part of a complete examination. Limited examinations for reoccurring indications may be performed as noted. The reflux portion of the exam is performed with the patient in reverse Trendelenburg.  +---------+---------------+---------+-----------+----------+--------------+ RIGHT    CompressibilityPhasicitySpontaneityPropertiesThrombus Aging +---------+---------------+---------+-----------+----------+--------------+ CFV      Full           Yes      Yes                                 +---------+---------------+---------+-----------+----------+--------------+ SFJ      Full                                                        +---------+---------------+---------+-----------+----------+--------------+  FV Prox  None           No       No                   Acute          +---------+---------------+---------+-----------+----------+--------------+ FV Mid   None           No       No                   Acute          +---------+---------------+---------+-----------+----------+--------------+ FV DistalNone           No       No                   Acute          +---------+---------------+---------+-----------+----------+--------------+ PFV      None           No       No                   Acute          +---------+---------------+---------+-----------+----------+--------------+ POP      None           No       No                   Acute          +---------+---------------+---------+-----------+----------+--------------+ PTV      None           No       No                   Acute          +---------+---------------+---------+-----------+----------+--------------+ PERO     None           No       No                   Acute          +---------+---------------+---------+-----------+----------+--------------+ Gastroc  None           No       No                    Acute          +---------+---------------+---------+-----------+----------+--------------+   +---------+---------------+---------+-----------+----------+-----------------+ LEFT     CompressibilityPhasicitySpontaneityPropertiesThrombus Aging    +---------+---------------+---------+-----------+----------+-----------------+ CFV      Full           Yes      Yes                                    +---------+---------------+---------+-----------+----------+-----------------+ SFJ      Full                                                           +---------+---------------+---------+-----------+----------+-----------------+ FV Prox  Full           Yes      Yes                                    +---------+---------------+---------+-----------+----------+-----------------+  FV Mid   Full           Yes      Yes                                    +---------+---------------+---------+-----------+----------+-----------------+ FV DistalFull           Yes      Yes                                    +---------+---------------+---------+-----------+----------+-----------------+ PFV      Full                                                           +---------+---------------+---------+-----------+----------+-----------------+ POP      None           No       No                   Age Indeterminate +---------+---------------+---------+-----------+----------+-----------------+ PTV      None           No       No                   Age Indeterminate +---------+---------------+---------+-----------+----------+-----------------+ PERO     None           No       No                   Age Indeterminate +---------+---------------+---------+-----------+----------+-----------------+ Gastroc  Full                                                           +---------+---------------+---------+-----------+----------+-----------------+    Summary: BILATERAL: -No evidence  of popliteal cyst, bilaterally. RIGHT: - Findings consistent with acute deep vein thrombosis involving the right femoral vein, right proximal profunda vein, right popliteal vein, right posterior tibial veins, right peroneal veins, and right gastrocnemius veins.  LEFT: - Findings consistent with age indeterminate deep vein thrombosis involving the left popliteal vein, left posterior tibial veins, and left peroneal veins.  *See table(s) above for measurements and observations. Electronically signed by Waverly Ferrari MD on 10/08/2020 at 4:04:14 PM.    Final    [place] EKG Site Rite  Result Date: 10/01/2020 If Site Rite image not attached, placement could not be confirmed due to current cardiac rhythm.   Labs:  CBC: Recent Labs    10/22/20 0326 10/23/20 0751 10/25/20 0212 10/27/20 0144  WBC 8.2 8.4 8.3 7.2  HGB 8.9* 9.2* 9.0* 9.2*  HCT 28.9* 29.6* 28.7* 29.2*  PLT 150 175 181 199    COAGS: Recent Labs    09/25/20 0309 10/25/20 1041  INR 1.0 1.1  APTT 27 34    BMP: Recent Labs    12/13/19 1155 03/02/20 0853 09/25/20 0309 10/03/20 0981 10/03/20 0854 10/22/20 0326 10/23/20 0751 10/25/20 0212 10/27/20 0144  NA 139 141   < > QUESTIONABLE RESULTS, RECOMMEND RECOLLECT TO VERIFY   < >  146* 140 133* 132*  K 4.3 4.1   < > QUESTIONABLE RESULTS, RECOMMEND RECOLLECT TO VERIFY   < > 4.1 4.4 4.5 4.1  CL 102 103   < > QUESTIONABLE RESULTS, RECOMMEND RECOLLECT TO VERIFY   < > 112* 107 101 99  CO2 27 23   < > QUESTIONABLE RESULTS, RECOMMEND RECOLLECT TO VERIFY   < > 26 28 23 25   GLUCOSE 103* 115*   < > QUESTIONABLE RESULTS, RECOMMEND RECOLLECT TO VERIFY   < > 136* 153* 119* 98  BUN 13 13   < > QUESTIONABLE RESULTS, RECOMMEND RECOLLECT TO VERIFY   < > 24* 22 20 13   CALCIUM 9.8 9.4   < > QUESTIONABLE RESULTS, RECOMMEND RECOLLECT TO VERIFY   < > 8.7* 8.7* 8.8* 8.7*  CREATININE 1.04 0.98   < > QUESTIONABLE RESULTS, RECOMMEND RECOLLECT TO VERIFY   < > 0.78 0.82 0.72 0.65  GFRNONAA >60 81   < >  QUESTIONABLE RESULTS, RECOMMEND RECOLLECT TO VERIFY   < > >60 >60 >60 >60  GFRAA >60 94  --  QUESTIONABLE RESULTS, RECOMMEND RECOLLECT TO VERIFY  --   --   --   --   --    < > = values in this interval not displayed.    LIVER FUNCTION TESTS: Recent Labs    10/12/20 0242 10/17/20 0642 10/18/20 0734 10/22/20 0326  BILITOT 0.7 0.8 1.0 0.8  AST 36 56* 61* 37  ALT 62* 126* 136* 79*  ALKPHOS 60 129* 129* 116  PROT 4.9* 6.6 6.3* 5.6*  ALBUMIN 2.1* 2.8* 2.8* 2.5*    TUMOR MARKERS: No results for input(s): AFPTM, CEA, CA199, CHROMGRNA in the last 8760 hours.  Assessment and Plan:  Extensive right leg edema. Doppler + for DVT of the right common femoral, SF junction, femoral, SF junction, proximal profunda, popliteal, posterior tibial, peroneal, soleal, gastrocnemius veins   Venogram showed thrombus involving the IVC and iliac veins. Large amount of thrombus involving the infrarenal IVC and IVC filter. Suprarenal IVC is patent. Thrombus in the right iliac veins and thrombus in the proximal femoral veins bilaterally.  Will proceed with venogram with thrombectomy and retrieval of the IVC filter today by Dr. Deanne CofferHassell.  Risks and benefits discussed with the patient's wofe including, but not limited to bleeding, infection, vascular injury, pulmonary embolism, inability to retrieve the filter, little or no improvement in condition, or even death.  All questions were answered, patient is agreeable to proceed. Consent signed and in chart.  Thank you for this interesting consult.  I greatly enjoyed meeting Lynann BeaverSteven L Mounts and look forward to participating in their care.  A copy of this report was sent to the requesting provider on this date.  Electronically Signed: Gwynneth MacleodWENDY S Tayden Duran, PA-C   10/27/2020, 11:35 AM      I spent a total of   15 Minutes in face to face in clinical consultation, greater than 50% of which was counseling/coordinating care for thrombectomy and removal of IVC filter.

## 2020-10-27 NOTE — Procedures (Signed)
  Procedure: IVCgram and mechanical thrombectomy (partial) EBL:   Complications:  none immediate  See full dictation in [employer].  Thora Lance MD Main # [phone] Pager  [phone] Mobile [phone]

## 2020-10-27 NOTE — Progress Notes (Signed)
PROGRESS NOTE    Walter [name]  WSF:[id number] DOB: Jan 24, 1956 DOA: 09/25/2020 PCP: Alroy Dust, L.Marlou Sa, MD   Brief Narrative:  HPI on 09/25/2020 by Dr. Amie Portland (neurology) Walter [name] is a 65 y.o. male past medical history of atrial flutter newly diagnosed with atrial fibrillation in February not on anticoagulation, follows with Dr. Rayann Heman in cardiology, last known well when he went to bed at 10 PM on 09/24/2020 and woke up at around 2 AM this morning and complained to his wife that he was having some discomfort in his head or ear. She was unable to understand him well and thought his speech was slurred.  EMS was called.  He was very drowsy and sleepy upon their assessment and was weaker on the right side in comparison to the left.  His vitals were not too abnormal-systolic blood pressures were in the 1 40-1 50 range on scene. He is not on any anticoagulation.  No prior history of strokes. He was brought in as an acute code stroke LVO positive. Evaluated at the ED bridge, with a GCS of 5. Right after the stat CT and CTA, he was emergently intubated due to inability to protect his airway-given GCS of 5.  Wife arrived much later. Provided history that nothing was out of the ordinary when he went to bed at 10 PM. Recent trip to [place] is the only travel history. She said that they have been cleaning their attic and there is although they did over the weekend.  Interim history Patient presented with acute CVA. Hospital course complicated by respiratory failure with hypoxia, s/p trach now on trach collar, pneumonia, PE/bilateral DVT s/p IVC filter.  Pending CIR. Assessment & Plan   Intracerebral hemorrhage -Patient presented with right-sided weakness, slurred speech.  Code stroke was called on presentation. -CT head showed acute intraparenchymal hemorrhage in the dorsal left pons with subarachnoid extension. -CT head and neck did not show vascular malformation, no LVO. -MRI on 4/8  showed 2-3 punctate infarcts in the left frontal and parietal lobes corresponding to small acute/subacute infarcts. -Echocardiogram showed EF 55 to 60%, no wall motion abnormalities -LDL 88, hemoglobin A1c 5.7 -Currently not on statin due to elevated LFTs -PT and OT recommending CIR  Acute hypoxic respiratory failure -Patient was intubated in the ED for airway protection -Failure to wean, status post tracheostomy on 10/04/2020.  Patient has been off of ventilator since 10/18/2020. -Currently on trach collar and on 5 L O2 -Continue Passy-Muir valve as tolerated -Speech therapy following as well as PCCM -PCCM feels that patient may be able to have a cuffless trach which may be changed today to help facilitate PMV use  Obstructive hydrocephalus -Intraventricular drain placed on 08/3020 -EVD stopped working on 09/30/2020 status post alteplase placement resulting in a new hemorrhage along the EVD tract -Repeat imaging showed improvement  Hospital-acquired pneumonia/ventilator associated pneumonia -Sputum culture from 09/27/20 was positive for Moraxella -Blood cultures show Acenatobacter -Infectious disease consulted and appreciated -Patient was treated with Unasyn and completed antibiotic course -Sputum culture on 10/16/2020 showed normal flora  Acute PE/bilateral lower extremity DVT -Lower extremity Doppler on 10/08/2020 showed acute DVT right lower extremity, age-indeterminate DVT on the left -Patient does have significant edema of the right lower extremity status post IVC filter placement on 4/10 -CTA showed bilateral PE on 09/2020 -Patient was on heparin drip and then transition to Eliquis -Patient with significant right lower extremity edema on 4/27, lower extremity Doppler reordered and showed DVT of the right  common femoral, SF junction, femoral, SF junction, proximal profunda, popliteal, posterior tibial, peroneal, soleal, gastrocnemius veins.  Question of IVC filter clot -Discussed with  hematology oncology and vascular surgery via phone on 10/25/2020, unlikely that this is a failure of anticoagulant given that anticoagulation was not started until 10/17/2020.  Would continue Eliquis at this time.  -Interventional radiology consulted and appreciated to discuss possible removal of IVC filter.  CT venogram: Positive for thrombus involving IVC and iliac veins.  Large amount of thrombus involving infrarenal IVC and IVC filter.  Suprarenal IVC is patent.  Thrombus in the right iliac veins and thrombus in the proximal femoral veins bilaterally. -Discussed with interventional radiology, planning for thrombectomy today.  Severe encephalopathy -Secondary to the above -Patient has had intermittent left upper extremity jerking -Focal seizures cannot be ruled out per LTM EEG which showed diffuse encephalopathy -Continue amantadine  Essential hypertension -BP currently stable  Paroxysmal atrial fibrillation -Status post ablation/cardioversion in the past -Was not on anticoagulation on admission -Currently on Eliquis -Continue Cardizem for rate control  Urinary retention -Resolved, continue condom cath  Hyperglycemia -As above hemoglobin A1c 5.7 -Continue insulin sliding scale and CBG monitoring  Dysphagia -Patient with cortrak in place -Speech therapy continues work with patient, and upgraded to dysphagia 3 diet.  Although currently n.p.o. for procedure. -PEG tube had been discussed with family however at this time would urge that we give patient more time prior to placement of PEG tube.  Discussed this with Dr. Leonie Man, neurology, who is also in agreement  Macrocytic anemia -Suspect secondary to acute illness as well as poor oral intake -Hemoglobin currently stable  Mild agitation -Will add on low-dose Seroquel nightly and monitor closely  Pressure ulcers -Wound care following Pressure Injury 10/21/20 Head Left;Posterior Stage 1 -  Intact skin with non-blanchable redness of a  localized area usually over a bony prominence. Pink non-blanchable scalp with thinning hair on posterior left side of head (Active)  10/21/20 1100  Location: Head  Location Orientation: Left;Posterior  Staging: Stage 1 -  Intact skin with non-blanchable redness of a localized area usually over a bony prominence.  Wound Description (Comments): Pink non-blanchable scalp with thinning hair on posterior left side of head  Present on Admission: No   DVT Prophylaxis Eliquis  Code Status: Full  Family Communication: None at bedside.  Disposition Plan:  Status is: Inpatient  Remains inpatient appropriate because:Inpatient level of care appropriate due to severity of illness   Dispo: The patient is from: Home              Anticipated d/c is to: CIR              Patient currently is medically stable to d/c.   Difficult to place patient No   Consultants Neurology Inpatient rehab PCCM Vascular surgery, Dr. Trula Slade, via phone Hematology/oncology, Dr. Irene Limbo, via phone  Procedures  Intubation/extubation Trach placement Echocardiogram Lower extremity Doppler IVC filter  Antibiotics   Anti-infectives (From admission, onward)   Start     Dose/Rate Route Frequency Ordered Stop   10/18/20 1100  Ampicillin-Sulbactam (UNASYN) 3 g in sodium chloride 0.9 % 100 mL IVPB        3 g 200 mL/hr over 30 Minutes Intravenous Every 6 hours 10/18/20 0931 10/21/20 0019   10/16/20 1100  Ampicillin-Sulbactam (UNASYN) 3 g in sodium chloride 0.9 % 100 mL IVPB  Status:  Discontinued        3 g 200 mL/hr over 30 Minutes Intravenous  Every 6 hours 10/16/20 0948 10/18/20 0859   10/07/20 0000  vancomycin (VANCOREADY) IVPB 1500 mg/300 mL  Status:  Discontinued        1,500 mg 150 mL/hr over 120 Minutes Intravenous Every 12 hours 10/06/20 1000 10/07/20 1406   10/06/20 1200  ceFEPIme (MAXIPIME) 2 g in sodium chloride 0.9 % 100 mL IVPB        2 g 200 mL/hr over 30 Minutes Intravenous Every 8 hours 10/06/20 1000  10/13/20 2225   10/06/20 1100  vancomycin (VANCOREADY) IVPB 2000 mg/400 mL        2,000 mg 200 mL/hr over 120 Minutes Intravenous  Once 10/06/20 1000 10/06/20 1314   10/02/20 0900  Ampicillin-Sulbactam (UNASYN) 3 g in sodium chloride 0.9 % 100 mL IVPB  Status:  Discontinued        3 g 200 mL/hr over 30 Minutes Intravenous Every 6 hours 10/02/20 0812 10/06/20 0954   09/29/20 1130  cefTRIAXone (ROCEPHIN) 2 g in sodium chloride 0.9 % 100 mL IVPB  Status:  Discontinued        2 g 200 mL/hr over 30 Minutes Intravenous Every 24 hours 09/29/20 1037 10/02/20 4158      Subjective:   Walter [name] seen and examined today.  Patient with no complaints of chest pain or shortness of breath, abdominal pain, nausea or vomiting, dizziness or headache.  Feels restless.    Objective:   Vitals:   10/27/20 0323 10/27/20 0500 10/27/20 0720 10/27/20 0831  BP: 121/63  115/66 115/66  Pulse: 76  77 89  Resp: 20  (!) 21 (!) 23  Temp: 98.4 F (36.9 C)  98.7 F (37.1 C)   TempSrc: Oral  Oral   SpO2: 97%  94% 95%  Weight:  100 kg    Height:        Intake/Output Summary (Last 24 hours) at 10/27/2020 1042 Last data filed at 10/27/2020 0926 Gross per 24 hour  Intake 418.67 ml  Output 1600 ml  Net -1181.33 ml   Filed Weights   10/24/20 0500 10/26/20 0500 10/27/20 0500  Weight: 105.1 kg 100.3 kg 100 kg   Exam  General: Well developed, chronically ill-appearing, deconditioned, NAD  HEENT: NCAT, mucous membranes moist. Cortrak  Neck: Trach  Cardiovascular: S1 S2 auscultated, RRR  Respiratory: Diminished breath sounds anteriorly  Abdomen: Soft, nontender, nondistended, + bowel sounds  Extremities: warm dry without cyanosis clubbing.  RLE edema, larger than LLE  Neuro: Awake and alert, however conversation is limited.  Able to move extremities.  Psych: restless, although appropriate  Data Reviewed: I have personally reviewed following labs and imaging studies  CBC: Recent Labs  Lab  10/21/20 0032 10/22/20 0326 10/23/20 0751 10/25/20 0212 10/27/20 0144  WBC 8.9 8.2 8.4 8.3 7.2  NEUTROABS  --  5.3 5.8 5.3  --   HGB 9.2* 8.9* 9.2* 9.0* 9.2*  HCT 30.8* 28.9* 29.6* 28.7* 29.2*  MCV 103.0* 102.8* 102.4* 102.1* 100.7*  PLT 176 150 175 181 309   Basic Metabolic Panel: Recent Labs  Lab 10/21/20 0032 10/22/20 0326 10/23/20 0751 10/25/20 0212 10/27/20 0144  NA 146* 146* 140 133* 132*  K 3.7 4.1 4.4 4.5 4.1  CL 110 112* 107 101 99  CO2 '28 26 28 23 25  ' GLUCOSE 131* 136* 153* 119* 98  BUN 31* 24* '22 20 13  ' CREATININE 0.91 0.78 0.82 0.72 0.65  CALCIUM 8.6* 8.7* 8.7* 8.8* 8.7*   GFR: Estimated Creatinine Clearance: 116.3 mL/min (by C-G formula  based on SCr of 0.65 mg/dL). Liver Function Tests: Recent Labs  Lab 10/22/20 0326  AST 37  ALT 79*  ALKPHOS 116  BILITOT 0.8  PROT 5.6*  ALBUMIN 2.5*   No results for input(s): LIPASE, AMYLASE in the last 168 hours. No results for input(s): AMMONIA in the last 168 hours. Coagulation Profile: Recent Labs  Lab 10/25/20 1041  INR 1.1   Cardiac Enzymes: No results for input(s): CKTOTAL, CKMB, CKMBINDEX, TROPONINI in the last 168 hours. BNP (last 3 results) No results for input(s): PROBNP in the last 8760 hours. HbA1C: No results for input(s): HGBA1C in the last 72 hours. CBG: Recent Labs  Lab 10/26/20 1646 10/26/20 2012 10/26/20 2323 10/27/20 0401 10/27/20 0801  GLUCAP 94 127* 108* 112* 115*   Lipid Profile: No results for input(s): CHOL, HDL, LDLCALC, TRIG, CHOLHDL, LDLDIRECT in the last 72 hours. Thyroid Function Tests: No results for input(s): TSH, T4TOTAL, FREET4, T3FREE, THYROIDAB in the last 72 hours. Anemia Panel: No results for input(s): VITAMINB12, FOLATE, FERRITIN, TIBC, IRON, RETICCTPCT in the last 72 hours. Urine analysis:    Component Value Date/Time   COLORURINE YELLOW 10/06/2020 1042   APPEARANCEUR HAZY (A) 10/06/2020 1042   LABSPEC >1.030 (H) 10/06/2020 1042   PHURINE 5.5  10/06/2020 1042   GLUCOSEU NEGATIVE 10/06/2020 1042   HGBUR LARGE (A) 10/06/2020 1042   BILIRUBINUR NEGATIVE 10/06/2020 1042   [place] 10/06/2020 1042   PROTEINUR 30 (A) 10/06/2020 1042   NITRITE NEGATIVE 10/06/2020 1042   LEUKOCYTESUR NEGATIVE 10/06/2020 1042   Sepsis Labs: '@LABRCNTIP' (procalcitonin:4,lacticidven:4)  ) No results found for this or any previous visit (from the past 240 hour(s)).    Radiology Studies: VAS [place] IVC/ILIAC (VENOUS ONLY)  Result Date: 10/25/2020 IVC/ILIAC STUDY Patient Name:  Walter [name]  Date of Exam:   10/25/2020 Medical Rec #: [id number]        Accession #:    [id number] Date of Birth: 1955-11-07        Patient Gender: M Patient Age:   65Y Exam Location:  [facility] Procedure:      VAS [place] IVC/ILIAC (VENOUS ONLY) Referring Phys: [id number] [facility] Etienne Millward --------------------------------------------------------------------------------  Indications: Extensive right lower extremity DVT Limitations: Air/bowel gas, patient unable to cooperate and obesity.  Comparison Study: No prior study Performing Technologist: Maudry Mayhew MHA, RDMS, RVT, RDCS  Examination Guidelines: A complete evaluation includes B-mode imaging, spectral Doppler, color Doppler, and power Doppler as needed of all accessible portions of each vessel. Bilateral testing is considered an integral part of a complete examination. Limited examinations for reoccurring indications may be performed as noted.  IVC/Iliac Findings: +----------+------+--------+--------+    IVC    PatentThrombusComments +----------+------+--------+--------+ IVC Prox         acute           +----------+------+--------+--------+ IVC Mid          acute           +----------+------+--------+--------+ IVC Distal       acute           +----------+------+--------+--------+  +-------------------+---------+-----------+---------+-----------+--------+         CIV         RT-PatentRT-ThrombusLT-PatentLT-ThrombusComments +-------------------+---------+-----------+---------+-----------+--------+ Common Iliac Distal            acute    patent                      +-------------------+---------+-----------+---------+-----------+--------+  +-------------------------+---------+-----------+---------+-----------+--------+  EIV           RT-PatentRT-ThrombusLT-PatentLT-ThrombusComments +-------------------------+---------+-----------+---------+-----------+--------+ External Iliac Vein                  acute    patent                      Distal                                                                    +-------------------------+---------+-----------+---------+-----------+--------+   Summary: IVC/Iliac: There is evidence of acute thrombus involving the IVC. There is no evidence of thrombus involving the left common iliac vein. There is evidence of acute thrombus involving the right common iliac vein. There is no evidence of thrombus involving  the left external iliac vein. There is evidence of acute thrombus involving the right external iliac vein.  *See table(s) above for measurements and observations.  Electronically signed by Harold Barban MD on 10/25/2020 at 6:16:09 PM.    Final    CT VENOGRAM ABD/PEL  Result Date: 10/27/2020 CLINICAL DATA:  65 year old with bilateral lower extremity DVT. Evaluate for IVC involvement. EXAM: CT VENOGRAM ABDOMEN AND PELVIS TECHNIQUE: Multidetector CT imaging of the abdomen and pelvis was performed using the standard protocol during bolus administration of intravenous contrast. Multiplanar reconstructed images and MIPs were obtained and reviewed to evaluate the vascular anatomy. CONTRAST:  119m OMNIPAQUE IOHEXOL 350 MG/ML SOLN COMPARISON:  10/17/2020 FINDINGS: VASCULAR Arterial: Normal caliber of the abdominal aorta with atherosclerotic disease. No evidence for aortic aneurysm. Although this is not a  CTA examination, there is flow in the celiac trunk, SMA, bilateral renal arteries and IMA. Bilateral iliac arteries and proximal femoral arteries are patent bilaterally. Venous: IVC filter positioned just below the renal veins. The infrarenal IVC is expanded with a large amount of thrombus. Greater than 70% of the infrarenal IVC contains thrombus. There is thrombus within the filter that probably extends up to the filter retrieval hook. Bilateral renal veins are patent. Suprarenal IVC is patent. Left common and external iliac veins are patent without definite thrombus. There appears to be some nonocclusive thrombus involving the distal left common femoral vein. There is clot at the IVC bifurcation. No significant clot in the right common iliac vein but there is expansile nonocclusive thrombus in the right external iliac vein. There is thrombus involving the proximal right femoral veins including the profunda femoral vein and the proximal femoral veins. 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 Main portal venous system is patent. Review of the MIP images confirms the above findings. NON-VASCULAR Lower chest: Small right pleural effusion is new. Probable trace left pleural fluid. Compressive atelectasis at both lung bases. Hepatobiliary: Normal appearance of the and gallbladder. No biliary dilatation. Pancreas: Unremarkable. No pancreatic ductal dilatation or surrounding inflammatory changes. Spleen: Normal in size without focal abnormality. Adrenals/Urinary Tract: Normal adrenal glands. Bilateral parapelvic renal cysts. No hydronephrosis. Moderate amount of fluid in the urinary bladder. Stomach/Bowel: Orogastric feeding tube that terminates near the duodenal bulb. No evidence for bowel dilatation or focal inflammation. Multiple diverticula involving the sigmoid colon. Appendix has a normal appearance and contains oral contrast. Lymphatic: No abdominal or pelvic lymphadenopathy.  Reproductive: No acute abnormality to the  prostate. Other: Increased presacral edema. Subcutaneous edema. No significant free fluid in the abdomen or pelvis. Negative for free air. Small umbilical hernia containing fat. Musculoskeletal: Disc space narrowing at L5-S1. No acute bone abnormality. IMPRESSION: VASCULAR 1. Positive for thrombus involving the IVC and iliac veins. Large amount of thrombus involving the infrarenal IVC and IVC filter. Suprarenal IVC is patent. Thrombus in the right iliac veins and thrombus in the proximal femoral veins bilaterally. 2.  Aortic Atherosclerosis (ICD10-I70.0). NON-VASCULAR 1. New small right pleural effusion. Compressive atelectasis at the lung bases. 2. Subcutaneous edema with increased presacral edema. 3. Bilateral parapelvic renal cysts without hydronephrosis. 4. Colonic diverticulosis without acute colonic inflammation. Electronically Signed   By: Markus Daft M.D.   On: 10/27/2020 09:55   VAS [place] LOWER EXTREMITY VENOUS (DVT)  Result Date: 10/25/2020  Lower Venous DVT Study Patient Name:  Walter [name]  Date of Exam:   10/25/2020 Medical Rec #: [id number]        Accession #:    [id number] Date of Birth: 12/23/1955        Patient Gender: M Patient Age:   65Y Exam Location:  [facility] Procedure:      VAS [place] LOWER EXTREMITY VENOUS (DVT) Referring Phys: [id number] [facility] Shiah Berhow --------------------------------------------------------------------------------  Indications: Recent right lower extremity DVT with worsening right lower extremity edema.  Limitations: Patient unable to cooperate. Comparison Study: 10/08/2020 lower extremity venous duplex- extensive right lower                   extremity acute DVT Performing Technologist: Maudry Mayhew MHA, RDMS, RVT, RDCS  Examination Guidelines: A complete evaluation includes B-mode imaging, spectral Doppler, color Doppler, and power Doppler as needed of all accessible portions of each vessel. Bilateral testing is  considered an integral part of a complete examination. Limited examinations for reoccurring indications may be performed as noted. The reflux portion of the exam is performed with the patient in reverse Trendelenburg.  +---------+---------------+---------+-----------+----------+--------------+ RIGHT    CompressibilityPhasicitySpontaneityPropertiesThrombus Aging +---------+---------------+---------+-----------+----------+--------------+ CFV      None                    No                   Acute          +---------+---------------+---------+-----------+----------+--------------+ SFJ      None                                         Acute          +---------+---------------+---------+-----------+----------+--------------+ FV Prox  None                                         Acute          +---------+---------------+---------+-----------+----------+--------------+ FV Mid   None                                         Acute          +---------+---------------+---------+-----------+----------+--------------+ FV DistalNone  Acute          +---------+---------------+---------+-----------+----------+--------------+ PFV      None                                         Acute          +---------+---------------+---------+-----------+----------+--------------+ POP      None                    No                   Acute          +---------+---------------+---------+-----------+----------+--------------+ PTV      None                                         Acute          +---------+---------------+---------+-----------+----------+--------------+ PERO     None                                         Acute          +---------+---------------+---------+-----------+----------+--------------+ Soleal   None                                         Acute           +---------+---------------+---------+-----------+----------+--------------+ Gastroc  None                                         Acute          +---------+---------------+---------+-----------+----------+--------------+   +----+---------------+---------+-----------+----------+-----------------+ LEFTCompressibilityPhasicitySpontaneityPropertiesThrombus Aging    +----+---------------+---------+-----------+----------+-----------------+ CFV Partial                 Yes                  Age Indeterminate +----+---------------+---------+-----------+----------+-----------------+     Summary: RIGHT: - Findings consistent with acute deep vein thrombosis involving the right common femoral vein, SF junction, right femoral vein, right proximal profunda vein, right popliteal vein, right posterior tibial veins, right peroneal veins, right soleal veins, and right gastrocnemius veins. - No cystic structure found in the popliteal fossa.  LEFT: - Findings consistent with age indeterminate deep vein thrombosis involving the left common femoral vein.  *See table(s) above for measurements and observations. Electronically signed by Harold Barban MD on 10/25/2020 at 6:15:59 PM.    Final      Scheduled Meds: . amantadine  200 mg Per Tube BID  . apixaban  5 mg Per Tube BID  . chlorhexidine gluconate (MEDLINE KIT)  15 mL Mouth Rinse BID  . Chlorhexidine Gluconate Cloth  6 each Topical Daily  . diltiazem  30 mg Per Tube Q6H  . doxazosin  2 mg Per Tube Daily  . feeding supplement  237 mL Oral BID BM  . feeding supplement (PROSource TF)  45 mL Per Tube TID  . feeding supplement (VITAL 1.5 CAL)  960 mL Per Tube Q24H  . free water  200 mL Per  Tube Q4H  . insulin aspart  0-20 Units Subcutaneous Q4H  . insulin glargine  6 Units Subcutaneous Daily  . mouth rinse  15 mL Mouth Rinse 10 times per day  . ondansetron (ZOFRAN) IV  4 mg Intravenous Q6H  . sodium chloride flush  10-40 mL Intracatheter Q12H    Continuous Infusions: . sodium chloride Stopped (10/19/20 1506)     LOS: 32 days   Time Spent in minutes   45 minutes  Abigail Marsiglia D.O. on 10/27/2020 at 10:42 AM  Between 7am to 7pm - Please see pager noted on [email or web address]  After 7pm go to [email or web address]  And look for the night coverage person covering for me after hours  [facility] Office  [phone]

## 2020-10-27 NOTE — Progress Notes (Signed)
Inpatient Rehab Admissions Coordinator:   Discussed with Dr. Catha Gosselin plan for IR today for thrombectomy. We can admit patient to CIR on Saturday (4/30).  Dr. Catha Gosselin in agreement.  Rehab MD (Dr. Carlis Abbott) to assess pt and confirm admission on Saturday.  Floor RN can call CIR at [phone] for report after 12pm on Saturday.  I will let pt/family and case manager know.    Estill Dooms, PT, DPT Admissions Coordinator [phone] 10/27/20  11:22 AM

## 2020-10-27 NOTE — Procedures (Signed)
Tracheostomy Exchange Procedure Note  Walter [name]  [id number]  10/03/1955  Date:10/27/20  Time:5:03 PM   Provider Performing:Matheus Spiker Kathie Rhodes Celine Mans   Procedure: Tracheostomy Exchange Through Immature Stoma (79728)  Indication(s) Respiratory failure  Consent Risks of the procedure as well as the alternatives and risks of each were explained to the patient and/or caregiver.  Consent for the procedure was obtained and is signed in the bedside chart  Anesthesia None   Time Out Verified patient identification, verified procedure, site/side was marked, verified correct patient position, special equipment/implants available, medications/allergies/relevant history reviewed, required imaging and test results available.   Sterile Technique Hand hygiene, gloves   Procedure Description Size 6.0 cuffed existing Shiley removed and size 6.0 uncuffed Shiley placed through stoma.   Complications/Tolerance None; patient tolerated the procedure well..   EBL none   Durel Salts, MD Pulmonary and Critical Care Medicine [facility]

## 2020-10-27 NOTE — Progress Notes (Signed)
Just received pt back from IR. NAD noted and vss. No formation of hematoma on left femoral venous site.

## 2020-10-28 ENCOUNTER — Inpatient Hospital Stay (HOSPITAL_COMMUNITY)
Admission: RE | Admit: 2020-10-28 | Discharge: 2020-11-23 | DRG: 037 | Disposition: A | Discharge status: Home Health | Payer: Medicare Other | Source: Intra-hospital | Attending: Physical Medicine & Rehabilitation | Admitting: Physical Medicine & Rehabilitation

## 2020-10-28 ENCOUNTER — Other Ambulatory Visit: Payer: Self-pay

## 2020-10-28 ENCOUNTER — Encounter (HOSPITAL_COMMUNITY): Payer: Self-pay | Admitting: Physical Medicine & Rehabilitation

## 2020-10-28 DIAGNOSIS — Z8249 Family history of ischemic heart disease and other diseases of the circulatory system: Secondary | ICD-10-CM | POA: Diagnosis not present

## 2020-10-28 DIAGNOSIS — I10 Essential (primary) hypertension: Secondary | ICD-10-CM | POA: Diagnosis present

## 2020-10-28 DIAGNOSIS — I610 Nontraumatic intracerebral hemorrhage in hemisphere, subcortical: Secondary | ICD-10-CM | POA: Diagnosis not present

## 2020-10-28 DIAGNOSIS — L89811 Pressure ulcer of head, stage 1: Secondary | ICD-10-CM | POA: Diagnosis present

## 2020-10-28 DIAGNOSIS — I69191 Dysphagia following nontraumatic intracerebral hemorrhage: Secondary | ICD-10-CM

## 2020-10-28 DIAGNOSIS — I82431 Acute embolism and thrombosis of right popliteal vein: Secondary | ICD-10-CM | POA: Diagnosis present

## 2020-10-28 DIAGNOSIS — I82511 Chronic embolism and thrombosis of right femoral vein: Secondary | ICD-10-CM | POA: Diagnosis present

## 2020-10-28 DIAGNOSIS — I82220 Acute embolism and thrombosis of inferior vena cava: Secondary | ICD-10-CM | POA: Diagnosis not present

## 2020-10-28 DIAGNOSIS — I82421 Acute embolism and thrombosis of right iliac vein: Secondary | ICD-10-CM | POA: Diagnosis present

## 2020-10-28 DIAGNOSIS — I69128 Other speech and language deficits following nontraumatic intracerebral hemorrhage: Secondary | ICD-10-CM

## 2020-10-28 DIAGNOSIS — Z794 Long term (current) use of insulin: Secondary | ICD-10-CM | POA: Diagnosis not present

## 2020-10-28 DIAGNOSIS — I480 Paroxysmal atrial fibrillation: Secondary | ICD-10-CM

## 2020-10-28 DIAGNOSIS — Z79899 Other long term (current) drug therapy: Secondary | ICD-10-CM | POA: Diagnosis not present

## 2020-10-28 DIAGNOSIS — I69391 Dysphagia following cerebral infarction: Secondary | ICD-10-CM | POA: Diagnosis not present

## 2020-10-28 DIAGNOSIS — I613 Nontraumatic intracerebral hemorrhage in brain stem: Secondary | ICD-10-CM | POA: Diagnosis not present

## 2020-10-28 DIAGNOSIS — I8290 Acute embolism and thrombosis of unspecified vein: Secondary | ICD-10-CM

## 2020-10-28 DIAGNOSIS — G479 Sleep disorder, unspecified: Secondary | ICD-10-CM

## 2020-10-28 DIAGNOSIS — I2699 Other pulmonary embolism without acute cor pulmonale: Secondary | ICD-10-CM

## 2020-10-28 DIAGNOSIS — Z430 Encounter for attention to tracheostomy: Secondary | ICD-10-CM

## 2020-10-28 DIAGNOSIS — I69119 Unspecified symptoms and signs involving cognitive functions following nontraumatic intracerebral hemorrhage: Secondary | ICD-10-CM | POA: Diagnosis not present

## 2020-10-28 DIAGNOSIS — I82461 Acute embolism and thrombosis of right calf muscular vein: Secondary | ICD-10-CM | POA: Diagnosis present

## 2020-10-28 DIAGNOSIS — E440 Moderate protein-calorie malnutrition: Secondary | ICD-10-CM | POA: Insufficient documentation

## 2020-10-28 DIAGNOSIS — R1310 Dysphagia, unspecified: Secondary | ICD-10-CM | POA: Diagnosis present

## 2020-10-28 DIAGNOSIS — Z6824 Body mass index (BMI) 24.0-24.9, adult: Secondary | ICD-10-CM

## 2020-10-28 DIAGNOSIS — I82413 Acute embolism and thrombosis of femoral vein, bilateral: Secondary | ICD-10-CM | POA: Diagnosis present

## 2020-10-28 DIAGNOSIS — I82441 Acute embolism and thrombosis of right tibial vein: Secondary | ICD-10-CM | POA: Diagnosis present

## 2020-10-28 DIAGNOSIS — R739 Hyperglycemia, unspecified: Secondary | ICD-10-CM | POA: Diagnosis present

## 2020-10-28 DIAGNOSIS — I69151 Hemiplegia and hemiparesis following nontraumatic intracerebral hemorrhage affecting right dominant side: Principal | ICD-10-CM

## 2020-10-28 DIAGNOSIS — Z888 Allergy status to other drugs, medicaments and biological substances status: Secondary | ICD-10-CM | POA: Diagnosis not present

## 2020-10-28 DIAGNOSIS — Z91040 Latex allergy status: Secondary | ICD-10-CM | POA: Diagnosis not present

## 2020-10-28 DIAGNOSIS — Z930 Tracheostomy status: Secondary | ICD-10-CM | POA: Diagnosis not present

## 2020-10-28 DIAGNOSIS — G4700 Insomnia, unspecified: Secondary | ICD-10-CM | POA: Diagnosis not present

## 2020-10-28 DIAGNOSIS — I619 Nontraumatic intracerebral hemorrhage, unspecified: Secondary | ICD-10-CM | POA: Diagnosis present

## 2020-10-28 DIAGNOSIS — I69122 Dysarthria following nontraumatic intracerebral hemorrhage: Secondary | ICD-10-CM | POA: Diagnosis not present

## 2020-10-28 DIAGNOSIS — I611 Nontraumatic intracerebral hemorrhage in hemisphere, cortical: Secondary | ICD-10-CM | POA: Diagnosis not present

## 2020-10-28 LAB — GLUCOSE, CAPILLARY
Glucose-Capillary: 167 mg/dL — ABNORMAL HIGH (ref 70–99)
Glucose-Capillary: 101 mg/dL — ABNORMAL HIGH (ref 70–99)
Glucose-Capillary: 127 mg/dL — ABNORMAL HIGH (ref 70–99)
Glucose-Capillary: 125 mg/dL — ABNORMAL HIGH (ref 70–99)
Glucose-Capillary: 104 mg/dL — ABNORMAL HIGH (ref 70–99)

## 2020-10-28 LAB — BASIC METABOLIC PANEL
Anion gap: 8 (ref 5–15)
Anion gap: 8 (ref 5–15)
BUN: 12 mg/dL (ref 8–23)
BUN: 8 mg/dL (ref 8–23)
CO2: 24 mmol/L (ref 22–32)
CO2: 25 mmol/L (ref 22–32)
Calcium: 8.4 mg/dL — ABNORMAL LOW (ref 8.9–10.3)
Calcium: 8.6 mg/dL — ABNORMAL LOW (ref 8.9–10.3)
Chloride: 101 mmol/L (ref 98–111)
Chloride: 101 mmol/L (ref 98–111)
Creatinine, Ser: 0.68 mg/dL (ref 0.61–1.24)
Creatinine, Ser: 0.68 mg/dL (ref 0.61–1.24)
GFR, Estimated: >60 mL/min (ref >60)
GFR, Estimated: >60 mL/min (ref >60)
Glucose, Bld: 137 mg/dL — ABNORMAL HIGH (ref 70–99)
Glucose, Bld: 107 mg/dL — ABNORMAL HIGH (ref 70–99)
Potassium: 4 mmol/L (ref 3.5–5.1)
Potassium: 4 mmol/L (ref 3.5–5.1)
Sodium: 133 mmol/L — ABNORMAL LOW (ref 135–145)
Sodium: 134 mmol/L — ABNORMAL LOW (ref 135–145)

## 2020-10-28 LAB — HEMOGLOBIN AND HEMATOCRIT, BLOOD
HCT: 25.6 % — ABNORMAL LOW (ref 39.0–52.0)
Hemoglobin: 8.3 g/dL — ABNORMAL LOW (ref 13.0–17.0)

## 2020-10-28 MED ORDER — OXYCODONE HCL 5 MG PO TABS: 5.0000 mg | ORAL_TABLET | Freq: Four times a day (QID) | ORAL | Status: DC | PRN

## 2020-10-28 MED ORDER — INSULIN GLARGINE 100 UNIT/ML ~~LOC~~ SOLN
6.0000 [IU] | Freq: Every day | SUBCUTANEOUS | Status: DC
  Administered 2020-10-29 – 2020-11-05 (×8): 6 [IU] via SUBCUTANEOUS
  Filled 2020-10-28 (×9): qty 0.06

## 2020-10-28 MED ORDER — PROSOURCE TF PO LIQD: 45.0000 mL | Freq: Three times a day (TID) | ORAL | Status: DC

## 2020-10-28 MED ORDER — APIXABAN 5 MG PO TABS
5.0000 mg | ORAL_TABLET | Freq: Two times a day (BID) | ORAL | Status: DC
  Administered 2020-10-28 – 2020-11-07 (×19): 5 mg
  Filled 2020-10-28 (×20): qty 1

## 2020-10-28 MED ORDER — AMANTADINE HCL 50 MG/5ML PO SOLN
200.0000 mg | Freq: Two times a day (BID) | ORAL | Status: DC
  Administered 2020-10-28 – 2020-11-07 (×19): 200 mg
  Filled 2020-10-28 (×23): qty 20

## 2020-10-28 MED ORDER — PROSOURCE TF PO LIQD
45.0000 mL | Freq: Three times a day (TID) | ORAL | Status: DC
  Administered 2020-10-28 – 2020-11-06 (×25): 45 mL
  Filled 2020-10-28 (×23): qty 45

## 2020-10-28 MED ORDER — APIXABAN 5 MG PO TABS: 5.0000 mg | ORAL_TABLET | Freq: Two times a day (BID) | ORAL | Status: DC

## 2020-10-28 MED ORDER — DILTIAZEM 12 MG/ML ORAL SUSPENSION
30.0000 mg | Freq: Four times a day (QID) | ORAL | Status: DC
  Administered 2020-10-28 – 2020-11-07 (×37): 30 mg
  Filled 2020-10-28 (×6): qty 6
  Filled 2020-10-28: qty 3
  Filled 2020-10-28 (×8): qty 6
  Filled 2020-10-28: qty 3
  Filled 2020-10-28 (×6): qty 6
  Filled 2020-10-28 (×2): qty 3
  Filled 2020-10-28: qty 6
  Filled 2020-10-28 (×4): qty 3
  Filled 2020-10-28: qty 6
  Filled 2020-10-28: qty 3
  Filled 2020-10-28: qty 6
  Filled 2020-10-28: qty 3
  Filled 2020-10-28 (×6): qty 6
  Filled 2020-10-28: qty 3
  Filled 2020-10-28 (×2): qty 6

## 2020-10-28 MED ORDER — FREE WATER
200.0000 mL | Status: DC
Start: 2020-10-28 — End: 2020-11-23

## 2020-10-28 MED ORDER — DOXAZOSIN MESYLATE 2 MG PO TABS
2.0000 mg | ORAL_TABLET | Freq: Every day | ORAL | Status: DC
  Administered 2020-10-29 – 2020-11-07 (×9): 2 mg
  Filled 2020-10-28 (×10): qty 1

## 2020-10-28 MED ORDER — DOXAZOSIN MESYLATE 2 MG PO TABS: 2.0000 mg | ORAL_TABLET | Freq: Every day | ORAL | Status: DC

## 2020-10-28 MED ORDER — OXYCODONE HCL 5 MG PO TABS: 5.0000 mg | ORAL_TABLET | Freq: Four times a day (QID) | ORAL | 0 refills | Status: DC | PRN

## 2020-10-28 MED ORDER — QUETIAPINE FUMARATE 25 MG PO TABS
12.5000 mg | ORAL_TABLET | Freq: Every day | ORAL | Status: DC
Start: 2020-10-28 — End: 2020-11-23

## 2020-10-28 MED ORDER — AQUAPHOR EX OINT
TOPICAL_OINTMENT | Freq: Two times a day (BID) | CUTANEOUS | Status: AC | PRN
  Filled 2020-10-28: qty 50

## 2020-10-28 MED ORDER — ACETAMINOPHEN 325 MG PO TABS: 650.0000 mg | ORAL_TABLET | ORAL | Status: DC | PRN

## 2020-10-28 MED ORDER — INSULIN GLARGINE 100 UNIT/ML ~~LOC~~ SOLN: 6.0000 [IU] | Freq: Every day | SUBCUTANEOUS | 11 refills | Status: DC

## 2020-10-28 MED ORDER — ENSURE ENLIVE PO LIQD
237.0000 mL | Freq: Two times a day (BID) | ORAL | Status: DC
  Administered 2020-10-29 – 2020-11-03 (×9): 237 mL via ORAL

## 2020-10-28 MED ORDER — OXYCODONE HCL 5 MG PO TABS: 5.0000 mg | ORAL_TABLET | Freq: Three times a day (TID) | ORAL | Status: DC | PRN

## 2020-10-28 MED ORDER — ACETAMINOPHEN 650 MG RE SUPP: 650.0000 mg | RECTAL | Status: DC | PRN

## 2020-10-28 MED ORDER — GUAIFENESIN-DM 100-10 MG/5ML PO SYRP: 5.0000 mL | ORAL_SOLUTION | ORAL | 0 refills | Status: DC | PRN

## 2020-10-28 MED ORDER — DILTIAZEM 12 MG/ML ORAL SUSPENSION: 30.0000 mg | Freq: Four times a day (QID) | ORAL | Status: DC

## 2020-10-28 MED ORDER — VITAL 1.5 CAL PO LIQD
960.0000 mL | Freq: Every day | ORAL | Status: DC
  Administered 2020-10-29: 960 mL
  Filled 2020-10-28 (×2): qty 1000

## 2020-10-28 MED ORDER — ENSURE ENLIVE PO LIQD: 237.0000 mL | Freq: Two times a day (BID) | ORAL | 12 refills | Status: DC

## 2020-10-28 MED ORDER — FREE WATER
200.0000 mL | Status: DC
  Administered 2020-10-28 – 2020-10-30 (×10): 200 mL

## 2020-10-28 MED ORDER — INSULIN ASPART 100 UNIT/ML ~~LOC~~ SOLN
0.0000 [IU] | SUBCUTANEOUS | Status: DC
  Administered 2020-10-29 – 2020-10-30 (×3): 3 [IU] via SUBCUTANEOUS
  Administered 2020-10-30: 4 [IU] via SUBCUTANEOUS
  Administered 2020-10-30 – 2020-10-31 (×3): 3 [IU] via SUBCUTANEOUS
  Administered 2020-11-01 – 2020-11-03 (×2): 4 [IU] via SUBCUTANEOUS
  Administered 2020-11-03 – 2020-11-05 (×8): 3 [IU] via SUBCUTANEOUS
  Administered 2020-11-05: 4 [IU] via SUBCUTANEOUS
  Administered 2020-11-06 (×2): 3 [IU] via SUBCUTANEOUS

## 2020-10-28 MED ORDER — VITAL 1.5 CAL PO LIQD: 960.0000 mL | ORAL | Status: DC

## 2020-10-28 MED ORDER — GUAIFENESIN-DM 100-10 MG/5ML PO SYRP
5.0000 mL | ORAL_SOLUTION | ORAL | Status: DC | PRN
  Administered 2020-10-30 – 2020-11-01 (×3): 5 mL
  Filled 2020-10-28 (×3): qty 10

## 2020-10-28 MED ORDER — AMANTADINE HCL 50 MG/5ML PO SOLN: 200.0000 mg | Freq: Two times a day (BID) | ORAL | Status: DC

## 2020-10-28 MED ORDER — INSULIN ASPART 100 UNIT/ML ~~LOC~~ SOLN: 0.0000 [IU] | SUBCUTANEOUS | 11 refills | Status: DC

## 2020-10-28 NOTE — H&P (Signed)
Physical Medicine and Rehabilitation Admission H&P  CC: [facility]  HPI: Walter [name] is a 65 year old right-handed male history of atrial fibrillation with ablation not on anticoagulation followed by Dr. Johney Frame.  Patient lives with spouse independent prior to admission working as a Veterinary surgeon.  Presented 09/17/2020 with right side weakness headache and dysarthria as well as vomiting.    Cranial CT/MRI showed acute intraparenchymal hemorrhage in dorsal left pons with subarachnoid extension into the cerebral aqueduct and fourth ventricle.  CT angiogram head and neck no vascular malformation, anomaly or aneurysm.  Echocardiogram with ejection fraction of 55 to 60% grade 1 diastolic dysfunction no regional wall motion abnormalities.  Admission chemistries unremarkable aside glucose 169 alcohol negative urine drug screen negative.  Patient did require emergent intubation for airway protection.  Hospital course complicated by hydrocephalus with diagnostic cerebral angiogram completed showing no evidence of aneurysm.  EVD was placed per neurosurgery.  Maintained on 3% hypertonic saline.  Patient was extubated 09/28/2020.  Follow-up cardiology services for atrial fibrillation maintained on Cardizem.  Patient was cleared to begin Lovenox for DVT prophylaxis 09/28/2020.  Repeat head CT 09/29/2020 showed new intraventricular hemorrhage in the third and lateral ventricles.  Fourth ventricular and midbrain hemorrhage unchanged and again repeated 10/24/2020 showing improving hemorrhage and edema along the shunt tract in the right frontal lobe.  No new area of hemorrhage..  Patient with long-term ventilatory support undergone tracheostomy 10/04/2020 per Dr.Chand critical care services.  On 10/08/2020 patient found to have bilateral lower extremity DVTs and underwent placement of IVC filter placement per interventional radiology.  Dopplers again completed 10/25/2020 showing acute DVT right common femoral vein and SF junction right  femoral vein and right proximal profunda vein right popliteal vein and right posterior tibial vein right peroneal vein right soleal vein and right gastrocnemius vein it was established the patient had an IVC filter maintained and Eliquis was also initiated.  Patient with fever of unknown origin infectious disease consulted broad-spectrum antibiotics  initiated.  SARS coronavirus negative.  CT chest abdomen pelvis did show bilateral large volume right greater than left pulmonary emboli no evidence of right heart strain and it was discussed to continue IVC filter as well as Eliquis.  Interventional radiology consulted to discuss possible removal of IVC filter with CT venogram 10/27/2020 showed thrombus involving the IVC and iliac veins.  Large amount of thrombus involving the infrarenal IVC and IVC filter.  Suprarenal IVC was patent.  Thrombus in the right iliac veins and thrombus in the proximal femoral veins bilaterally and plan was to undergo thrombectomy and retrieval of IVC filter 10/27/2020 per Dr. Deanne Coffer of interventional radiology..  Patient initially n.p.o. with alternative means of nutritional support advanced to dysphagia #3 thin liquids and also discussion of possible need for PEG tube.  Therapy evaluations were completed due to patient decreased functional mobility was admitted for a comprehensive rehab program. Very eager to go home. Wife at bedside.  Review of Systems  Unable to perform ROS: Acuity of condition  Constitutional: Negative.   HENT: Negative.   Eyes: Positive for double vision.  Respiratory: Negative.   Cardiovascular: Negative.   Gastrointestinal: Negative.   Genitourinary: Negative.   Musculoskeletal: Negative.   Skin: Negative.   Neurological: Negative.   Endo/Heme/Allergies: Negative.   Psychiatric/Behavioral: Negative.    Past Medical History:  Diagnosis Date  . Atrial flutter (HCC)   . Dizzy 09/25/13  . Near syncope 09/25/13   Past Surgical History:  Procedure  Laterality Date  .  A-FLUTTER ABLATION N/A 03/09/2020   Procedure: A-FLUTTER ABLATION;  Surgeon: Hillis Range, MD;  Location: [facility] INVASIVE CV LAB;  Service: Cardiovascular;  Laterality: N/A;  . CARDIOVERSION N/A 09/27/2013   Procedure: CARDIOVERSION;  Surgeon: Lewayne Bunting, MD;  Location: [facility] ENDOSCOPY;  Service: Cardiovascular;  Laterality: N/A;  . IR ANGIO EXTERNAL CAROTID SEL EXT CAROTID UNI R MOD SED  09/26/2020  . IR ANGIO INTRA EXTRACRAN SEL COM CAROTID INNOMINATE BILAT MOD SED  09/26/2020  . IR ANGIO VERTEBRAL SEL VERTEBRAL UNI R MOD SED  09/26/2020  . IR IVC FILTER PLMT / S&I /IMG GUID/MOD SED  10/08/2020  . IR US GUIDE VASC ACCESS RIGHT  09/26/2020  . RETINAL DETACHMENT SURGERY    . right knee arthroscopy    . TEE WITHOUT CARDIOVERSION N/A 09/27/2013   Procedure: TRANSESOPHAGEAL ECHOCARDIOGRAM (TEE);  Surgeon: Lewayne Bunting, MD;  Location: [facility] ENDOSCOPY;  Service: Cardiovascular;  Laterality: N/A;  . WRIST SURGERY     Family History  Problem Relation Age of Onset  . Heart disease Mother   . Heart disease Father   . Healthy Sister   . Healthy Brother    Social History:  reports that he has never smoked. He has never used smokeless tobacco. He reports current alcohol use. He reports that he does not use drugs. Allergies:  Allergies  Allergen Reactions  . Keppra [Levetiracetam] Swelling    Patient experienced angioedema post inpatient keppra dose  . Latex Itching    Skin turns real red   Medications Prior to Admission  Medication Sig Dispense Refill  . amantadine (SYMMETREL) 50 MG/5ML solution Place 20 mLs (200 mg total) into feeding tube 2 (two) times daily.    [name] apixaban (ELIQUIS) 5 MG TABS tablet Place 1 tablet (5 mg total) into feeding tube 2 (two) times daily. 60 tablet   . cholecalciferol (VITAMIN D3) 25 MCG (1000 UNIT) tablet Take 1,000 Units by mouth daily.    [name] diltiazem (CARDIZEM) 10 mg/ml oral suspension Place 3 mLs (30 mg total) into feeding tube every 6 (six) hours.     [name] doxazosin (CARDURA) 2 MG tablet Place 1 tablet (2 mg total) into feeding tube daily.    [name] EPINEPHrine 0.3 mg/0.3 mL IJ SOAJ injection Inject 0.3 mg into the muscle once as needed for anaphylaxis.    . feeding supplement (ENSURE ENLIVE / ENSURE PLUS) LIQD Take 237 mLs by mouth 2 (two) times daily between meals. 237 mL 12  . guaiFENesin-dextromethorphan (ROBITUSSIN DM) 100-10 MG/5ML syrup Place 5 mLs into feeding tube every 4 (four) hours as needed for cough. 118 mL 0  . insulin aspart (NOVOLOG) 100 UNIT/ML injection Inject 0-20 Units into the skin every 4 (four) hours. 10 mL 11  . insulin glargine (LANTUS) 100 UNIT/ML injection Inject 0.06 mLs (6 Units total) into the skin daily. 10 mL 11  . Nutritional Supplements (FEEDING SUPPLEMENT, PROSOURCE TF,) liquid Place 45 mLs into feeding tube 3 (three) times daily.    . Nutritional Supplements (FEEDING SUPPLEMENT, VITAL 1.5 CAL,) LIQD Place 960 mLs into feeding tube daily.    [name] oxyCODONE (OXY IR/ROXICODONE) 5 MG immediate release tablet Place 1 tablet (5 mg total) into feeding tube every 6 (six) hours as needed (RASS goal 0). 30 tablet 0  . QUEtiapine (SEROQUEL) 25 MG tablet Take 0.5 tablets (12.5 mg total) by mouth at bedtime.    . Water For Irrigation, Sterile (FREE WATER) SOLN Place 200 mLs into feeding tube every 4 (four) hours.  Drug Regimen Review Drug regimen was reviewed and remains appropriate with no significant issues identified  Home:     Functional History:    Functional Status:  Mobility:          ADL:    Cognition: Cognition Orientation Level: (P) Intubated/Tracheostomy - Unable to assess    Physical Exam: There were no vitals taken for this visit. Physical Exam Gen: no distress, normal appearing HEENT: oral mucosa pink and moist, Tracheostomy tube in place Cardio: Reg rate Chest: normal effort, normal rate of breathing Abd: soft, non-distended Ext: no edema Psych: pleasant, normal affect Skin:  intact    Comments: Patient is agitated and restless.  +NGT.  He does make eye contact with examiner follows some basic commands. 4/5 strength throughout   Results for orders placed or performed during the hospital encounter of 09/25/20 (from the past 48 hour(s))  Glucose, capillary     Status: Abnormal   Collection Time: 10/26/20  8:12 PM  Result Value Ref Range   Glucose-Capillary 127 (H) 70 - 99 mg/dL    Comment: Glucose reference range applies only to samples taken after fasting for at least 8 hours.  Glucose, capillary     Status: Abnormal   Collection Time: 10/26/20 11:23 PM  Result Value Ref Range   Glucose-Capillary 108 (H) 70 - 99 mg/dL    Comment: Glucose reference range applies only to samples taken after fasting for at least 8 hours.  CBC     Status: Abnormal   Collection Time: 10/27/20  1:44 AM  Result Value Ref Range   WBC 7.2 4.0 - 10.5 K/uL   RBC 2.90 (L) 4.22 - 5.81 MIL/uL   Hemoglobin 9.2 (L) 13.0 - 17.0 g/dL   HCT 66.0 (L) 63.0 - 16.0 %   MCV 100.7 (H) 80.0 - 100.0 fL   MCH 31.7 26.0 - 34.0 pg   MCHC 31.5 30.0 - 36.0 g/dL   RDW 10.9 (H) 32.3 - 55.7 %   Platelets 199 150 - 400 K/uL   nRBC 0.0 0.0 - 0.2 %    Comment: Performed at [facility], [address], [place], [place] [address]  Basic metabolic panel     Status: Abnormal   Collection Time: 10/27/20  1:44 AM  Result Value Ref Range   Sodium 132 (L) 135 - 145 mmol/L   Potassium 4.1 3.5 - 5.1 mmol/L   Chloride 99 98 - 111 mmol/L   CO2 25 22 - 32 mmol/L   Glucose, Bld 98 70 - 99 mg/dL    Comment: Glucose reference range applies only to samples taken after fasting for at least 8 hours.   BUN 13 8 - 23 mg/dL   Creatinine, Ser 5.42 0.61 - 1.24 mg/dL   Calcium 8.7 (L) 8.9 - 10.3 mg/dL   GFR, Estimated >70 >62 mL/min    Comment: (NOTE) Calculated using the CKD-EPI Creatinine Equation (2021)    Anion gap 8 5 - 15    Comment: Performed at [facility], [address], [place], [place] [address]   Glucose, capillary     Status: Abnormal   Collection Time: 10/27/20  4:01 AM  Result Value Ref Range   Glucose-Capillary 112 (H) 70 - 99 mg/dL    Comment: Glucose reference range applies only to samples taken after fasting for at least 8 hours.  Glucose, capillary     Status: Abnormal   Collection Time: 10/27/20  8:01 AM  Result Value Ref  Range   Glucose-Capillary 115 (H) 70 - 99 mg/dL    Comment: Glucose reference range applies only to samples taken after fasting for at least 8 hours.   Comment 1 Notify RN    Comment 2 Document in Chart   Hepatic function panel     Status: Abnormal   Collection Time: 10/27/20 10:53 AM  Result Value Ref Range   Total Protein 5.7 (L) 6.5 - 8.1 g/dL   Albumin 2.9 (L) 3.5 - 5.0 g/dL   AST 39 15 - 41 U/L   ALT 57 (H) 0 - 44 U/L   Alkaline Phosphatase 95 38 - 126 U/L   Total Bilirubin 0.8 0.3 - 1.2 mg/dL   Bilirubin, Direct 0.1 0.0 - 0.2 mg/dL   Indirect Bilirubin 0.7 0.3 - 0.9 mg/dL    Comment: Performed at [facility], [address] [place], [place] [address]  POCT Activated clotting time     Status: None   Collection Time: 10/27/20  2:29 PM  Result Value Ref Range   Activated Clotting Time 101 seconds  POCT Activated clotting time     Status: None   Collection Time: 10/27/20  3:02 PM  Result Value Ref Range   Activated Clotting Time 232 seconds  Glucose, capillary     Status: Abnormal   Collection Time: 10/27/20  4:36 PM  Result Value Ref Range   Glucose-Capillary 108 (H) 70 - 99 mg/dL    Comment: Glucose reference range applies only to samples taken after fasting for at least 8 hours.   Comment 1 Notify RN    Comment 2 Document in Chart   Glucose, capillary     Status: Abnormal   Collection Time: 10/27/20  8:03 PM  Result Value Ref Range   Glucose-Capillary 156 (H) 70 - 99 mg/dL    Comment: Glucose reference range applies only to samples taken after fasting for at least 8 hours.   Comment 1 Notify RN    Comment 2 Document in  Chart   Glucose, capillary     Status: Abnormal   Collection Time: 10/27/20 11:35 PM  Result Value Ref Range   Glucose-Capillary 111 (H) 70 - 99 mg/dL    Comment: Glucose reference range applies only to samples taken after fasting for at least 8 hours.   Comment 1 Notify RN    Comment 2 Document in Chart   Hemoglobin and hematocrit, blood     Status: Abnormal   Collection Time: 10/28/20  2:26 AM  Result Value Ref Range   Hemoglobin 8.3 (L) 13.0 - 17.0 g/dL   HCT 29.525.6 (L) 62.139.0 - 30.852.0 %    Comment: Performed at [facility], [address] [place], [place] [address]  Basic metabolic panel     Status: Abnormal   Collection Time: 10/28/20  2:26 AM  Result Value Ref Range   Sodium 133 (L) 135 - 145 mmol/L   Potassium 4.0 3.5 - 5.1 mmol/L   Chloride 101 98 - 111 mmol/L   CO2 24 22 - 32 mmol/L   Glucose, Bld 137 (H) 70 - 99 mg/dL    Comment: Glucose reference range applies only to samples taken after fasting for at least 8 hours.   BUN 12 8 - 23 mg/dL   Creatinine, Ser 6.960.68 0.61 - 1.24 mg/dL   Calcium 8.4 (L) 8.9 - 10.3 mg/dL   GFR, Estimated >29>60 >52>60 mL/min    Comment: (NOTE) Calculated using the CKD-EPI Creatinine Equation (  2021)    Anion gap 8 5 - 15    Comment: Performed at [facility], [address] [place], [place] [address]  Glucose, capillary     Status: Abnormal   Collection Time: 10/28/20  3:13 AM  Result Value Ref Range   Glucose-Capillary 167 (H) 70 - 99 mg/dL    Comment: Glucose reference range applies only to samples taken after fasting for at least 8 hours.   Comment 1 Notify RN    Comment 2 Document in Chart   Glucose, capillary     Status: Abnormal   Collection Time: 10/28/20  8:05 AM  Result Value Ref Range   Glucose-Capillary 101 (H) 70 - 99 mg/dL    Comment: Glucose reference range applies only to samples taken after fasting for at least 8 hours.  Glucose, capillary     Status: Abnormal   Collection Time: 10/28/20 11:51 AM  Result Value Ref  Range   Glucose-Capillary 127 (H) 70 - 99 mg/dL    Comment: Glucose reference range applies only to samples taken after fasting for at least 8 hours.  Glucose, capillary     Status: Abnormal   Collection Time: 10/28/20  4:16 PM  Result Value Ref Range   Glucose-Capillary 125 (H) 70 - 99 mg/dL    Comment: Glucose reference range applies only to samples taken after fasting for at least 8 hours.   No results found.     Medical Problem List and Plan: 1.  Right side weakness and slurred speech secondary to intracerebral hemorrhage complicated by hydrocephalus.  Status post EVD placed per neurosurgery  -patient may not shower  -ELOS/Goals: 3 weeks S  -Admit to CIR 2.  Antithrombotics: Acute PE/bilateral lower extremity DVT.  Status post IVC filter 10/08/2020 with thrombectomy and retrieval of IVC filter 10/28/2020 per interventional radiology -DVT/anticoagulation: Continue Eliquis  -antiplatelet therapy: N/A 3. Pain Management: Decrease oxycodone to q8H prn 4. Mood: Amantadine 200 mg twice daily  -antipsychotic agents: N/A 5. Neuropsych: This patient is not capable of making decisions on his own behalf. 6. Skin/Wound Care: Routine skin checks 7. Fluids/Electrolytes/Nutrition: Routine in and outs with follow-up chemistries 8.  PAF.  Status post ablation cardioversion in the Past.  Continue Eliquis.  Cardio exam 30 mg every 6 hours.  Cardiac rate controlled 9.  Acute hypoxic respiratory failure.  Tracheostomy 10/04/2020 per DrChand.  Presently with a #6 Shiley.  PMV as tolerated.  Follow-up speech therapy. Daily weights.  10.  Hospital-acquired pneumonia/ventilator associated pneumonia.  Infectious disease follow-up antibiotic therapy completed 11.  Dysphagia.  Dysphagia #1 thin liquids.  Discussing plans for possible PEG tube. Will need assistance with eating 12.  Hyperglycemia related to tube feeds.  Lantus insulin 6 units daily.  Continue SSI.  Hemoglobin A1c was 5.7  4/30: CBGs 111-167, due  to TF. Wean Lantus as weans off TF.   I have personally performed a face to face diagnostic evaluation, including, but not limited to relevant history and physical exam findings, of this patient and developed relevant assessment and plan.  Additionally, I have reviewed and concur with the physician assistant's documentation above.  Sula Soda, MD  Mcarthur Rossetti Angiulli, PA-C

## 2020-10-28 NOTE — Progress Notes (Signed)
Chief Complaint: Patient was seen today for follow up venacavogram with mechanical thrombectomy and removal of IVC filter   Supervising Physician: Mir, [name]  Patient Status: [facility] - In-pt  Subjective: Pt looks pretty good. No overnight issues Wife at bedside For discharge to inpt rehab today  Objective: Physical Exam: BP 128/68   Pulse 92   Temp 98.3 F (36.8 C) (Axillary)   Resp (!) 26   Ht '6\' 2"'  (1.88 m)   Wt 98.3 kg   SpO2 97%   BMI 27.82 kg/m  Abd: soft, NT (L)groin femoral venous access site, soft, NT. Suture removed. Bilateral LE with some edema but soft, NT Feet warm   Current Facility-Administered Medications:  .  0.9 %  sodium chloride infusion, , Intravenous, PRN, Rosalin Hawking, MD, Stopped at 10/19/20 1506 .  acetaminophen (TYLENOL) tablet 650 mg, 650 mg, Per Tube, Q4H PRN, 650 mg at 10/26/20 1204 **OR** [DISCONTINUED] acetaminophen (TYLENOL) 160 MG/5ML solution 650 mg, 650 mg, Per Tube, Q4H PRN, 650 mg at 10/19/20 0411 **OR** acetaminophen (TYLENOL) suppository 650 mg, 650 mg, Rectal, Q4H PRN, Rosalin Hawking, MD .  amantadine (SYMMETREL) 50 MG/5ML solution 200 mg, 200 mg, Per Tube, BID, Rosalin Hawking, MD, 200 mg at 10/28/20 0927 .  apixaban (ELIQUIS) tablet 5 mg, 5 mg, Per Tube, BID, Mikhail, Maryann, DO, 5 mg at 10/28/20 1054 .  chlorhexidine gluconate (MEDLINE KIT) (PERIDEX) 0.12 % solution 15 mL, 15 mL, Mouth Rinse, BID, Greta Doom, MD, 15 mL at 10/28/20 0856 .  Chlorhexidine Gluconate Cloth 2 % PADS 6 each, 6 each, Topical, Daily, Rosalin Hawking, MD, 6 each at 10/28/20 [phone] .  diltiazem (CARDIZEM) 10 mg/ml oral suspension 30 mg, 30 mg, Per Tube, Q6H, Rosalin Hawking, MD, 30 mg at 10/28/20 1054 .  doxazosin (CARDURA) tablet 2 mg, 2 mg, Per Tube, Daily, Adhikari, Amrit, MD, 2 mg at 10/28/20 1054 .  feeding supplement (ENSURE ENLIVE / ENSURE PLUS) liquid 237 mL, 237 mL, Oral, BID BM, Adhikari, Amrit, MD, 237 mL at 10/28/20 1348 .  feeding supplement  (PROSource TF) liquid 45 mL, 45 mL, Per Tube, TID, Adhikari, Amrit, MD, 45 mL at 10/28/20 1055 .  feeding supplement (VITAL 1.5 CAL) liquid 960 mL, 960 mL, Per Tube, Q24H, Shelly Coss, MD, Stopped at 10/28/20 0542 .  free water 200 mL, 200 mL, Per Tube, Q4H, Adhikari, Amrit, MD, 200 mL at 10/28/20 1055 .  guaiFENesin-dextromethorphan (ROBITUSSIN DM) 100-10 MG/5ML syrup 5 mL, 5 mL, Per Tube, Q4H PRN, Margaretha Seeds, MD, 5 mL at 10/26/20 2356 .  hydrALAZINE (APRESOLINE) injection 20 mg, 20 mg, Intravenous, Q6H PRN, Briant Cedar, MD, 20 mg at 10/04/20 [phone] .  insulin aspart (novoLOG) injection 0-20 Units, 0-20 Units, Subcutaneous, Q4H, Magdalen Spatz, NP, 3 Units at 10/28/20 1154 .  insulin glargine (LANTUS) injection 6 Units, 6 Units, Subcutaneous, Daily, Jacky Kindle, MD, 6 Units at 10/28/20 1055 .  MEDLINE mouth rinse, 15 mL, Mouth Rinse, 10 times per day, Greta Doom, MD, 15 mL at 10/28/20 1350 .  metoprolol tartrate (LOPRESSOR) injection 2.5 mg, 2.5 mg, Intravenous, Q6H PRN, Bowser, Grace E, NP, 2.5 mg at 10/20/20 2119 .  mineral oil-hydrophilic petrolatum (AQUAPHOR) ointment, , Topical, BID PRN, Tawanna Solo, Amrit, MD .  ondansetron (ZOFRAN) injection 4 mg, 4 mg, Intravenous, Q6H, Rosalin Hawking, MD, 4 mg at 10/28/20 1203 .  oxyCODONE (Oxy IR/ROXICODONE) immediate release tablet 5 mg, 5 mg, Per Tube, Q6H PRN, Rosalin Hawking, MD .  QUEtiapine (SEROQUEL) tablet 12.5 mg, 12.5 mg, Oral, QHS, Mikhail, [place], DO, 12.5 mg at 10/27/20 2217 .  sodium chloride flush (NS) 0.9 % injection 10-40 mL, 10-40 mL, Intracatheter, Q12H, Rosalin Hawking, MD, 10 mL at 10/28/20 0953 .  sodium chloride flush (NS) 0.9 % injection 10-40 mL, 10-40 mL, Intracatheter, PRN, Rosalin Hawking, MD  Labs: CBC Recent Labs    10/27/20 0144 10/28/20 0226  WBC 7.2  --   HGB 9.2* 8.3*  HCT 29.2* 25.6*  PLT 199  --    BMET Recent Labs    10/27/20 0144 10/28/20 0226  NA 132* 133*  K 4.1 4.0  CL 99 101  CO2  25 24  GLUCOSE 98 137*  BUN 13 12  CREATININE 0.65 0.68  CALCIUM 8.7* 8.4*   LFT Recent Labs    10/27/20 1053  PROT 5.7*  ALBUMIN 2.9*  AST 39  ALT 57*  ALKPHOS 95  BILITOT 0.8  BILIDIR 0.1  IBILI 0.7    Assessment/Plan: S/p Inferior venacavogram with mechanical thrombectomy and removal of IVC filter. Now on Eliquis. To go to Inpt Rehab today.    LOS: 33 days   I spent a total of 20 minutes in face to face in clinical consultation, greater than 50% of which was counseling/coordinating care for follow up venogram with thrombectomy and removal IVC filter  Ascencion Dike PA-C 10/28/2020 2:24 PM

## 2020-10-28 NOTE — TOC Transition Note (Signed)
Transition of Care [facility]) - CM/SW Discharge Note   Patient Details  Name: Walter [name] MRN: [id number] Date of Birth: 11-14-1955  [facility]) CM/SW Contact:  Kermit Balo, RN Phone Number: 10/28/2020, 12:13 PM   Clinical Narrative:    Patient is discharging to CIR today. CM signing off.   Final next level of care: IP Rehab Facility Barriers to Discharge: No Barriers Identified   Patient Goals and CMS Choice        Discharge Placement                       Discharge Plan and Services                                     Social Determinants of Health (SDOH) Interventions     Readmission Risk Interventions No flowsheet data found.

## 2020-10-28 NOTE — Discharge Summary (Signed)
Physician Discharge Summary  Walter [name] ZOX:[id number] DOB: [birth year] DOA: 09/25/2020  PCP: Clovis Riley, L.August Saucer, MD  Admit date: 09/25/2020 Discharge date: 10/28/2020  Time spent: 45 minutes  Recommendations for Outpatient Follow-up:  Patient will be discharged to inpatient rehab.  Patient will need to follow up with primary care provider within one week of discharge.  Patient will need to follow up with Dr. Pearlean Brownie, neurology. Patient should continue medications as prescribed.  Patient should follow a dysphagia 3 diet.   Discharge Diagnoses:  Intracerebral hemorrhage Acute hypoxic respiratory failure Obstructive hydrocephalus Hospital-acquired pneumonia/ventilator associated pneumonia Acute PE/bilateral lower extremity DVT Severe encephalopathy Essential hypertension Paroxysmal atrial fibrillation Urinary retention Hyperglycemia Dysphagia Macrocytic anemia Mild agitation Pressure ulcers  Discharge Condition: Stable  Diet recommendation: dysphagia 3  Filed Weights   10/26/20 0500 10/27/20 0500 10/28/20 0359  Weight: 100.3 kg 100 kg 98.3 kg    History of present illness:  on 09/25/2020 by Dr. Milon Dikes (neurology) Walter [name] a 65 y.o.malepast medical history of atrial flutter newly diagnosed with atrial fibrillation in February not on anticoagulation, follows with Dr. Johney Frame in cardiology, last known well when he went to bed at 10 PM on 09/24/2020 and woke up at around 2 AM this morning and complained to his wife that he was having some discomfort in his head or ear. She was unable to understand him well and thought his speech was slurred. EMS was called. He was very drowsy and sleepy upon their assessment and was weaker on the right side in comparison to the left. His vitals were not too abnormal-systolic blood pressures were in the 1 40-1 50 range on scene. He is not on any anticoagulation. No prior history of strokes. He was brought in as an acute code stroke  LVO positive. Evaluated at the ED bridge, with a GCS of 5. Right after the stat CT and CTA, he was emergently intubated due to inability to protect his airway-given GCS of 5.  Wife arrived much later. Provided history that nothing was out of the ordinary when he went to bed at 10 PM. Recent trip to [place] is the only travel history. She said that they have been cleaning their attic and there is although they did over the weekend.  Hospital Course:  Intracerebral hemorrhage -Patient presented with right-sided weakness, slurred speech.  Code stroke was called on presentation. -CT head showed acute intraparenchymal hemorrhage in the dorsal left pons with subarachnoid extension. -CT head and neck did not show vascular malformation, no LVO. -MRI on 4/8 showed 2-3 punctate infarcts in the left frontal and parietal lobes corresponding to small acute/subacute infarcts. -Echocardiogram showed EF 55 to 60%, no wall motion abnormalities -LDL 88, hemoglobin A1c 5.7 -Currently not on statin due to elevated LFTs -PT and OT recommending CIR  Acute hypoxic respiratory failure -Patient was intubated in the ED for airway protection -Failure to wean, status post tracheostomy on 10/04/2020.  Patient has been off of ventilator since 10/18/2020. -Currently on trach collar and on 5 L O2 -Continue Passy-Muir valve as tolerated -Speech therapy following as well as PCCM -PCCM still following.  Status post tracheostomy exchange through immature stoma-to an uncuffed 6 Shiley  Obstructive hydrocephalus -Intraventricular drain placed on 08/3020 -EVD stopped working on 09/30/2020 status post alteplase placement resulting in a new hemorrhage along the EVD tract -Repeat imaging showed improvement  Hospital-acquired pneumonia/ventilator associated pneumonia -Sputum culture from 09/27/20 was positive for Moraxella -Blood cultures show Acenatobacter -Infectious disease consulted and appreciated -Patient was  treated  with Unasyn and completed antibiotic course -Sputum culture on 10/16/2020 showed normal flora  Acute PE/bilateral lower extremity DVT -Lower extremity Doppler on 10/08/2020 showed acute DVT right lower extremity, age-indeterminate DVT on the left -Patient does have significant edema of the right lower extremity status post IVC filter placement on 4/10 -CTA showed bilateral PE on 09/2020 -Patient was on heparin drip and then transition to Eliquis -Patient with significant right lower extremity edema on 4/27, lower extremity Doppler reordered and showed DVT of the right common femoral, SF junction, femoral, SF junction, proximal profunda, popliteal, posterior tibial, peroneal, soleal, gastrocnemius veins.  Question of IVC filter clot -Discussed with hematology oncology and vascular surgery via phone on 10/25/2020, unlikely that this is a failure of anticoagulant given that anticoagulation was not started until 10/17/2020.  Would continue Eliquis at this time.  -Interventional radiology consulted and appreciated to discuss possible removal of IVC filter.  CT venogram: Positive for thrombus involving IVC and iliac veins.  Large amount of thrombus involving infrarenal IVC and IVC filter.  Suprarenal IVC is patent.  Thrombus in the right iliac veins and thrombus in the proximal femoral veins bilaterally. -Discussed with interventional radiology, status post IVC gram and mechanical thrombectomy  Severe encephalopathy -Secondary to the above -Patient has had intermittent left upper extremity jerking -Focal seizures cannot be ruled out per LTM EEG which showed diffuse encephalopathy -Continue amantadine  Essential hypertension -BP currently stable  Paroxysmal atrial fibrillation -Status post ablation/cardioversion in the past -Was not on anticoagulation on admission -Currently on Eliquis -Continue Cardizem for rate control  Urinary retention -Resolved, continue condom cath  Hyperglycemia -As  above hemoglobin A1c 5.7 -Continue insulin sliding scale and CBG monitoring  Dysphagia -Patient with cortrak in place -Speech therapy continues work with patient, and upgraded to dysphagia 3 diet.  Although currently n.p.o. for procedure. -PEG tube had been discussed with family however at this time would urge that we give patient more time prior to placement of PEG tube.  Discussed this with Dr. Pearlean Brownie, neurology, who is also in agreement  Macrocytic anemia -Suspect secondary to acute illness as well as poor oral intake -Hemoglobin currently stable  Mild agitation -Continue low-dose Seroquel nightly and can likely be weaned as he progresses in rehab  Pressure ulcers -Wound care following Pressure Injury 10/21/20 Head Left;Posterior Stage 1 -  Intact skin with non-blanchable redness of a localized area usually over a bony prominence. Pink non-blanchable scalp with thinning hair on posterior left side of head (Active)  10/21/20 1100  Location: Head  Location Orientation: Left;Posterior  Staging: Stage 1 -  Intact skin with non-blanchable redness of a localized area usually over a bony prominence.  Wound Description (Comments): Pink non-blanchable scalp with thinning hair on posterior left side of head  Present on Admission: No   Procedures: Intubation/extubation Trach placement Echocardiogram Lower extremity Doppler IVC filter Tracheostomy exchange, #6 uncuffed IVC gram and mechanical thrombectomy  Consultations: Neurology Inpatient rehab PCCM Vascular surgery, Dr. Myra Gianotti, via phone Hematology/oncology, Dr. Candise Che, via phone Interventional radiology  Discharge Exam: Vitals:   10/28/20 0450 10/28/20 0826  BP:  124/71  Pulse: 87 86  Resp: 20 19  Temp:    SpO2:  99%     General: Well developed, chronically ill-appearing, NAD, deconditioned  Neck: Trach  HEENT: NCAT, mucous membranes moist.  Cortrak  Cardiovascular: S1 S2 auscultated, RRR, no  murmur  Respiratory: Diminished breath sounds anteriorly  Abdomen: Soft, nontender, nondistended, + bowel sounds  Extremities: warm  dry without cyanosis clubbing.  RLE edema/larger than LLE  Neuro: Awake and alert, limited conversation.  Mabel to move all extremities.  Currently looking for his phone  Psych: appropriate  Discharge Instructions Discharge Instructions    Discharge wound care:   Complete by: As directed    Cleanse sacral, buttocks and perirectal skin with soap and water and pat dry. Apply Aquaphor ointment each shift and PRN soilage/moisture.  No disposable briefs or underpads in bed.   Increase activity slowly   Complete by: As directed      Allergies as of 10/28/2020      Reactions   Keppra [levetiracetam] Swelling   Patient experienced angioedema post inpatient keppra dose   Latex Itching   Skin turns real red      Medication List    STOP taking these medications   diltiazem 120 MG 24 hr capsule Commonly known as: CARDIZEM CD   Ester-C 500-550 MG Tabs     TAKE these medications   amantadine 50 MG/5ML solution Commonly known as: SYMMETREL Place 20 mLs (200 mg total) into feeding tube 2 (two) times daily.   apixaban 5 MG Tabs tablet Commonly known as: ELIQUIS Place 1 tablet (5 mg total) into feeding tube 2 (two) times daily.   cholecalciferol 25 MCG (1000 UNIT) tablet Commonly known as: VITAMIN D3 Take 1,000 Units by mouth daily.   diltiazem 10 mg/ml  oral suspension Commonly known as: CARDIZEM Place 3 mLs (30 mg total) into feeding tube every 6 (six) hours.   doxazosin 2 MG tablet Commonly known as: CARDURA Place 1 tablet (2 mg total) into feeding tube daily.   EPINEPHrine 0.3 mg/0.3 mL Soaj injection Commonly known as: EPI-PEN Inject 0.3 mg into the muscle once as needed for anaphylaxis.   feeding supplement Liqd Take 237 mLs by mouth 2 (two) times daily between meals.   feeding supplement (PROSource TF) liquid Place 45 mLs into  feeding tube 3 (three) times daily.   feeding supplement (VITAL 1.5 CAL) Liqd Place 960 mLs into feeding tube daily.   free water Soln Place 200 mLs into feeding tube every 4 (four) hours.   guaiFENesin-dextromethorphan 100-10 MG/5ML syrup Commonly known as: ROBITUSSIN DM Place 5 mLs into feeding tube every 4 (four) hours as needed for cough.   insulin aspart 100 UNIT/ML injection Commonly known as: novoLOG Inject 0-20 Units into the skin every 4 (four) hours.   insulin glargine 100 UNIT/ML injection Commonly known as: LANTUS Inject 0.06 mLs (6 Units total) into the skin daily.   oxyCODONE 5 MG immediate release tablet Commonly known as: Oxy IR/ROXICODONE Place 1 tablet (5 mg total) into feeding tube every 6 (six) hours as needed (RASS goal 0).   QUEtiapine 25 MG tablet Commonly known as: SEROQUEL Take 0.5 tablets (12.5 mg total) by mouth at bedtime.            Discharge Care Instructions  (From admission, onward)         Start     Ordered   10/28/20 0000  Discharge wound care:       Comments: Cleanse sacral, buttocks and perirectal skin with soap and water and pat dry. Apply Aquaphor ointment each shift and PRN soilage/moisture.  No disposable briefs or underpads in bed.   10/28/20 0954         Allergies  Allergen Reactions  . Keppra [Levetiracetam] Swelling    Patient experienced angioedema post inpatient keppra dose  . Latex Itching  Skin turns real red    Follow-up Information    de Melchor Amour, Jerilynn Mages, MD Follow up in 4 week(s).   Specialties: Radiology, Interventional Radiology Why: Please follow-up with Dr. Tommie Sams in clinic 4 weeks after discharge. Our office will call you to set up this appointment. Contact information: [address] [place] [address] [phone]        Micki Riley, MD. Schedule an appointment as soon as possible for a visit in 4 week(s).   Specialties: Neurology, Radiology Why:  Hospital follow-up Contact information: [address] [place] [address] [phone]        Simonne Martinet, NP. Schedule an appointment as soon as possible for a visit in 4 week(s).   Specialties: Nurse Practitioner, Acute Care, Pulmonary Disease Why: Trach clinic Contact information: [address] [place] [address] [phone]        Clovis Riley, L.August Saucer, MD. Schedule an appointment as soon as possible for a visit in 4 week(s).   Specialty: Family Medicine Why: Hospital follow-up Contact information: [address] [place] [address] [phone]        Hillis Range, MD .   Specialty: Cardiology Contact information: [address] [place] [address] [phone]                The results of significant diagnostics from this hospitalization (including imaging, microbiology, ancillary and laboratory) are listed below for reference.    Significant Diagnostic Studies: CT HEAD WO CONTRAST  Result Date: 10/24/2020 CLINICAL DATA:  Acute neuro deficit. Unequal pupils. Double vision. History of intracranial hemorrhage in shunt EXAM: CT HEAD WITHOUT CONTRAST TECHNIQUE: Contiguous axial images were obtained from the base of the skull through the vertex without intravenous contrast. COMPARISON:  CT head 10/17/2020.  MRI head 10/06/2020 FINDINGS: Brain: Resolving hemorrhage and edema along the right frontal ventricular shunt tract. Ventricle size normal and stable. CSF density cavity in the left posterior midbrain at the site of prior hemorrhage. This is stable from the prior CT. No new area of hemorrhage or infarction compared with recent studies. Vascular: Negative for hyperdense vessel Skull: Right parietal burr hole over the convexity. No acute skeletal abnormality. Sinuses/Orbits: Paranasal sinuses clear. Bilateral mastoid effusion. Negative orbit. NG tube in place. Other: None IMPRESSION:  Ventricle size remains normal. Improving hemorrhage and edema along the shunt tract in the right frontal lobe. Stable cystic encephalomalacia left posterior midbrain at site of prior hemorrhage. No new area of hemorrhage or infarct compared with recent studies. Electronically Signed   By: Marlan Palau M.D.   On: 10/24/2020 20:01   CT HEAD WO CONTRAST  Result Date: 10/17/2020 CLINICAL DATA:  Follow-up intracranial hemorrhage EXAM: CT HEAD WITHOUT CONTRAST TECHNIQUE: Contiguous axial images were obtained from the base of the skull through the vertex without intravenous contrast. COMPARISON:  10/11/2020 FINDINGS: Brain: Intraparenchymal hemorrhage in the left side the upper pons and midbrain no longer shows any hyperdense components. There is an intra-axial cystic space measuring up to 15 mm in diameter, which may communicate with the fourth ventricle. No focal cerebellar abnormality is seen. Continuing diminishing density of blood along the ventriculostomy track in the right frontal region. Persistent brain edema surrounding that. Small amount of subarachnoid blood possibly subdural blood the ventriculostomy entry site appears similar. No evidence of any new or increased bleeding. Small cortical infarctions of the left frontoparietal vertex are barely appreciable. No evidence  of new brain infarction. Ventricular size remains stable without evidence of ongoing aqueductal obstruction. Previously seen hyperdense blood dependent within the occipital horns is diminishing. Vascular: No vascular finding. Skull: Normal other than the right frontal burr hole. Sinuses/Orbits: Paranasal sinuses are clear. Orbits are normal. No fluid in the middle ears. Small mastoid effusions. Other: None IMPRESSION: 1. Continuing diminishing density of blood along the ventriculostomy track in the right frontal region. No evidence of any new or increased bleeding. Diminishing dependent intraventricular blood in the lateral ventricles. Very  small amount of subarachnoid blood and possibly subdural blood at the ventriculostomy entry site appears similar. 2. Small cortical infarctions of the left frontoparietal vertex are barely appreciable. No evidence of new or increased infarction. 3. Ventricular size remains stable without evidence of ongoing aqueductal obstruction. 4. 15 mm intra-axial cystic space in the left side of the upper pons and midbrain at the site of the previous hematoma. This may communicate with the fourth ventricle. Electronically Signed   By: Paulina Fusi M.D.   On: 10/17/2020 07:35   CT HEAD WO CONTRAST  Result Date: 10/11/2020 CLINICAL DATA:  65 year old male with left dorsal midbrain hemorrhage, intraventricular extension presenting on 09/25/2020. Several superimposed small infarcts. Subsequent encounter. EXAM: CT HEAD WITHOUT CONTRAST TECHNIQUE: Contiguous axial images were obtained from the base of the skull through the vertex without intravenous contrast. COMPARISON:  Brain MRI 10/06/2020 and earlier. FINDINGS: Brain: Mildly decreased ventricle size since 10/06/2020. Stable small volume of intraventricular hemorrhage, mostly layering in the occipital horns. Small parenchymal hematoma along the course of the right EVD is stable since 10/06/2020 (series 3, images 27-31) with confluent regional edema. There is a small volume of right superior frontal gyrus subarachnoid hemorrhage associated and stable (image 32 today). Additional small volume subarachnoid hemorrhage over both convexities elsewhere. Decreasing hyperdense blood products at the left midbrain and thalamus since 10/06/2020. Continued heterogeneous edema and/or developing encephalomalacia at those sites. Decreased conspicuity of the left cerebellar PICA infarct from earlier this month. Small foci of left frontal or restricted diffusion recently on MRI are largely occult by CT. No new major vascular territory infarct identified. No midline shift.  Normal basilar  cisterns. Vascular: Mild Calcified atherosclerosis at the skull base. Skull: No acute osseous abnormality identified. Stable burr hole from the previous right superior approach EVD. Sinuses/Orbits: Increased bilateral middle ear and mastoid opacification. Stable left nasoenteric tube. Paranasal sinus aeration remains satisfactory. Other: Sequelae of the right superior approach EVD. Stable orbit and scalp soft tissues. IMPRESSION: 1. Decreased, improved ventricle size since 10/06/2020 with stable small volume of IVH. 2. Hematoma and edema along the right superior approach EVD tract is stable, including a small volume of bilateral SAH. 3. Expected evolution of the left midbrain/thalamic hemorrhage and edema. 4. Small additional scattered subacute infarcts remain largely occult by CT. 5. No new intracranial abnormality identified. Electronically Signed   By: Odessa Fleming M.D.   On: 10/11/2020 04:43   CT HEAD WO CONTRAST  Result Date: 10/06/2020 CLINICAL DATA:  Hydrocephalus EXAM: CT HEAD WITHOUT CONTRAST TECHNIQUE: Contiguous axial images were obtained from the base of the skull through the vertex without intravenous contrast. COMPARISON:  10/05/2020 FINDINGS: Brain: Unchanged intraparenchymal hematoma in the right frontal lobe along the tract of the previously present EVD. There is subarachnoid blood over the right hemisphere and within both lateral ventricles. Unchanged appearance of intraparenchymal dorsal left midbrain hemorrhage. Is unchanged size and configuration of the ventricles. Vascular: No hyperdense vessel or unexpected calcification. Skull:  Normal. Negative for fracture or focal lesion. Sinuses/Orbits: No acute finding. Other: None. IMPRESSION: 1. Unchanged appearance of intraparenchymal and subarachnoid hemorrhage, predominantly along the course of the EVD catheter that has now been removed. 2. Unchanged dorsal left midbrain intraparenchymal hemorrhage. Electronically Signed   By: Deatra Robinson M.D.   On:  10/06/2020 03:10   CT HEAD WO CONTRAST  Result Date: 10/05/2020 CLINICAL DATA:  Follow-up examination for intracranial hemorrhage. EXAM: CT HEAD WITHOUT CONTRAST TECHNIQUE: Contiguous axial images were obtained from the base of the skull through the vertex without intravenous contrast. COMPARISON:  Prior CT from 10/03/2020. FINDINGS: Brain: Right frontal approach ventriculostomy catheter remains in place with tip in the body of the right lateral ventricle. Intraparenchymal hemorrhage about the catheter tract with associated edema is relatively similar. Intraventricular blood layering primarily within the occipital horns of both lateral ventricles again seen, slightly decreased from prior. Overall ventricular size is not appreciably changed. No midline shift. Scattered subarachnoid hemorrhage overlying the right frontal and posterior cerebral convexities again seen, little interval change from previous. Hemorrhage at the dorsal left midbrain has continued to evolve and is now slightly decreased in size and less dense as compared to previous (series 3, image 14). Adjacent cerebral aqueduct remains patent. No other new hemorrhage. No interval large vessel territory infarct. Vascular: No hyperdense vessel. Skull: Right frontal burr hole with ventriculostomy in place. Sinuses/Orbits: Globes orbital soft tissues within normal limits. Nasogastric tube remains in place with scattered mucosal thickening within the ethmoidal air cells and maxillary sinuses. Small bilateral mastoid effusions noted. Other: None. IMPRESSION: 1. Stable position of right frontal approach ventriculostomy catheter with tip in the body of the right lateral ventricle. Intraventricular hemorrhage is slightly decreased from previous, with no appreciable change in overall ventricular size. 2. No significant interval change in hemorrhage along the catheter tract with additional scattered subarachnoid hemorrhage. 3. Slight interval decrease in size and  density of dorsal left midbrain hemorrhage. 4. No other new acute intracranial abnormality. Electronically Signed   By: Rise Mu M.D.   On: 10/05/2020 03:59   CT HEAD WO CONTRAST  Result Date: 10/03/2020 CLINICAL DATA:  Intracranial hemorrhage. EXAM: CT HEAD WITHOUT CONTRAST TECHNIQUE: Contiguous axial images were obtained from the base of the skull through the vertex without intravenous contrast. COMPARISON:  CT head 10/02/2020 FINDINGS: Brain: Right frontal ventricular drain extends into the right lateral ventricle. Stable hemorrhage along the catheter tract. Layering hemorrhage in the occipital horns of unchanged. Ventricles mildly larger compared to the prior study. Mild subarachnoid hemorrhage over the right convexity unchanged. Hemorrhage along the left tectum unchanged. No new hemorrhage. No midline shift. Vascular: Negative for hyperdense vessel Skull: No acute abnormality. Sinuses/Orbits: Mild mucosal edema paranasal sinuses. NG tube in place. Negative orbit Other: None IMPRESSION: Ventricle size shows mild interval enlargement since yesterday. No significant hydrocephalus. Intraventricular hemorrhage stable Right frontal ventricular drain unchanged. Hemorrhage surrounding the drain unchanged. Subarachnoid hemorrhage unchanged. Hemorrhage in the left tectum unchanged. No new hemorrhage identified. Electronically Signed   By: Marlan Palau M.D.   On: 10/03/2020 20:24   CT HEAD WO CONTRAST  Result Date: 10/02/2020 CLINICAL DATA:  Follow-up examination for cerebral hemorrhage. Desatting. EXAM: CT HEAD WITHOUT CONTRAST TECHNIQUE: Contiguous axial images were obtained from the base of the skull through the vertex without intravenous contrast. COMPARISON:  Prior CT from 10/01/2020. FINDINGS: Brain: Examination mildly degraded by motion artifact. Right parietal approach ventriculostomy catheter remains in place with tip near the septum pellucidum, stable. Subarachnoid  and intraparenchymal  hemorrhage along the catheter tract is relatively stable. Persistent small focus of pneumocephalus with mild surrounding edema, also similar. Intraventricular blood is decreased from previous, with only small volume hemorrhage now seen within the lateral ventricles. Overall ventricular size is decreased as well, most pronounced at the right lateral ventricle. 1.9 cm hemorrhage at the left dorsal pons is unchanged. Adjacent basilar cisterns remain patent. No other new acute intracranial hemorrhage. No acute large vessel territory infarct. No significant extra-axial collection. No midline shift. Vascular: No appreciable hyperdense vessel on this motion degraded exam. Skull: Right parietal ventriculostomy burr hole with overlying scalp soft tissue swelling. Otherwise negative. Sinuses/Orbits: Globes and orbital soft tissues demonstrate no acute finding. Visualized paranasal sinuses remain largely clear. Is a gastric tube partially visualized. No significant mastoid effusion. Other: None. IMPRESSION: 1. Interval decrease in intraventricular hemorrhage with decreased size of the lateral ventricles as compared to prior, right greater than left. Right parietal approach ventriculostomy in stable position with tip near the septum pellucidum. 2. Stable small volume subarachnoid and intraparenchymal hemorrhage along the catheter tract. 3. Stable 1.9 cm hemorrhage at the left dorsal pons. 4. No other new acute intracranial abnormality. Electronically Signed   By: Rise Mu M.D.   On: 10/02/2020 03:00   CT HEAD WO CONTRAST  Result Date: 10/01/2020 CLINICAL DATA:  Stroke, follow-up. Status post flushing of ventriculostomy catheter. EXAM: CT HEAD WITHOUT CONTRAST TECHNIQUE: Contiguous axial images were obtained from the base of the skull through the vertex without intravenous contrast. COMPARISON:  CT head without contrast 10/01/2020 at 4:09 a.m. and 09/29/2020. FINDINGS: Brain: New hemorrhage is present within the  right lateral ventricle. Small amount layering blood in the left lateral ventricle is stable. New hemorrhage is present in the third ventricle. Tracheostomy catheter is stable in position. Subarachnoid blood over the right convexity is stable. New hemorrhage is present along the ventriculostomy tract. The brainstem hemorrhage is stable. Ventricular size is stable to improved. The study is significantly degraded by patient motion. Technologist reports that the patient was snoring during the exam Vascular: No hyperdense vessel or unexpected calcification. Skull: Calvarium is intact apart from the ventriculostomy burr hole. No focal lytic or blastic lesions are present. Focal scalp soft tissue swelling is present at the ventriculostomy site. Sinuses/Orbits: The paranasal sinuses and mastoid air cells are clear. The globes and orbits are within normal limits. IMPRESSION: 1. New intraventricular hemorrhage within the right lateral ventricle and third ventricle. 2. New hemorrhage along the ventriculostomy tract. 3. Ventriculostomy catheter position is stable. Ventricle size is stable to slightly decreased. 4. Stable brainstem hemorrhage. 5. Stable subarachnoid blood over the right convexity. Critical Value/emergent results were called by telephone at the time of interpretation on 10/01/2020 at 1:27 pm to provider DAVID Yetta Barre , who verbally acknowledged these results. Electronically Signed   By: Marin Roberts M.D.   On: 10/01/2020 13:27   CT HEAD WO CONTRAST  Result Date: 10/01/2020 CLINICAL DATA:  65 year old male status post code stroke presentation with right side weakness on 09/25/2020, left dorsal midbrain hemorrhage with intraventricular extension. CTA, MRV, and conventional cerebral angiogram unrevealing. EXAM: CT HEAD WITHOUT CONTRAST TECHNIQUE: Contiguous axial images were obtained from the base of the skull through the vertex without intravenous contrast. COMPARISON:  Head CT 09/29/2020 and earlier.  FINDINGS: Brain: Right superior frontal approach EVD terminates in the right lateral ventricle near the septum pellucidum. Decreased volume of right intraventricular hemorrhage from 2 days ago. Small volume blood layering in both occipital  horns, with some redistribution on the left. Temporal horns are decreased and more normal. Largely resolved 3rd ventricular and 4th ventricular IVH. No transependymal edema. Trace subarachnoid hemorrhage at the right vertex likely secondary to EVD appears stable. Intra-axial hemorrhage centered in the left dorsal midbrain is stable and appears not significantly changed since presentation. Adjacent cerebral aqueduct appears patent. There are small hypodense infarcts in the left cerebellum PICA territory which are new from the MRI 09/25/2020, stable from the most recent CT (series 2, image 6). And similar left superior frontal gyrus cortical and subcortical white matter small infarcts are redemonstrated on series 2, image 30. Stable gray-white matter differentiation elsewhere, edema in the left brainstem. No brand new infarct identified since 09/29/2020. Vascular: Mild intracranial artery tortuosity. Skull: Right superior burr hole.  Otherwise negative. Sinuses/Orbits: Visualized paranasal sinuses and mastoids are stable and well pneumatized. Other: Left nasoenteric tube in place. Right vertex scalp mild postoperative changes related to external ventricular drain. Negative orbits. IMPRESSION: 1. Stable right superior frontal approach EVD with decreased volume of intraventricular hemorrhage and improved ventricular decompression from 2 days ago. 2. Small infarcts have developed in both the left superior frontal gyrus and the left cerebellum PICA territory since the MRI 09/25/2020, not significantly changed from 09/29/2020. 3. Left dorsal midbrain intra-axial hemorrhage has not significantly changed since presentation. Associated brainstem edema. 4. Stable trace subarachnoid  hemorrhage is stable and may be related to EVD placement. Electronically Signed   By: Odessa Fleming M.D.   On: 10/01/2020 05:11   CT HEAD WO CONTRAST  Result Date: 09/29/2020 CLINICAL DATA:  Follow-up intracranial hemorrhage EXAM: CT HEAD WITHOUT CONTRAST TECHNIQUE: Contiguous axial images were obtained from the base of the skull through the vertex without intravenous contrast. COMPARISON:  09/26/2020 FINDINGS: Brain: New right frontal external ventricular drain which traverses the body of the right lateral ventricle. Increase in intraventricular blood at the level of the third and lateral ventricles. Hemorrhage encompasses the lower catheter at the level of the parenchyma and also encompasses the intraventricular portion of the catheter. Small volume subarachnoid hemorrhage at the right vertex. The parenchymal hemorrhage in the left posterior midbrain, just left of the cerebral aqua duct, is unchanged. Unchanged ventriculomegaly. Small cortically based infarcts have become apparent along the superior left frontal convexity. Vascular: No hyperdense vessel or unexpected calcification. Skull: Normal. Negative for fracture or focal lesion. Sinuses/Orbits: No acute finding. IMPRESSION: 1. New intraventricular hemorrhage in the third and lateral ventricles. Fourth ventricular and midbrain hemorrhage is unchanged. 2. New right frontal EVD which traverses the right lateral ventricle. Blood clot encompasses the lower catheter and ventriculomegaly is similar to prior. 3. Small cortical infarcts are newly seen along the superior left frontal convexity. Electronically Signed   By: Marnee Spring M.D.   On: 09/29/2020 04:28   CT CHEST W CONTRAST  Result Date: 10/17/2020 CLINICAL DATA:  Fever of unknown origin. EXAM: CT CHEST, ABDOMEN, AND PELVIS WITH CONTRAST TECHNIQUE: Multidetector CT imaging of the chest, abdomen and pelvis was performed following the standard protocol during bolus administration of intravenous contrast.  CONTRAST:  OMNIPAQUE IOHEXOL 300 MG/ML  SOLN COMPARISON:  Plain film 10/17/2020.  No prior CTs. FINDINGS: CT CHEST FINDINGS Cardiovascular: Aortic atherosclerosis. Normal heart size, without pericardial effusion. Bilateral pulmonary emboli are identified on this non dedicated exam. Example in the central right pulmonary artery on 28/3. Branch pulmonary arteries to both upper and lower lobes on 32/3. Within the left upper lobe pulmonary artery branch on 20/3. Mediastinum/Nodes:  No mediastinal or hilar adenopathy. Lungs/Pleura: No pleural fluid. Endotracheal tube terminates appropriately. Areas of mild peripheral predominant airspace and ground-glass are indeterminate. Example at the left apex on 22/5 and right lower lobe on 58/5. More dependent bibasilar airspace opacities are likely due to atelectasis. Musculoskeletal: No acute osseous abnormality. CT ABDOMEN PELVIS FINDINGS Hepatobiliary: Mild hepatomegaly at 18.4 cm craniocaudal. Normal gallbladder, without biliary ductal dilatation. Pancreas: Normal, without mass or ductal dilatation. Spleen: Normal in size, without focal abnormality. Adrenals/Urinary Tract: Normal adrenal glands. Bilateral renal sinus cysts, without hydronephrosis. Foley catheter within the urinary bladder. Stomach/Bowel: Nasogastric tube terminating at the gastric antrum. Rectal catheter in place. Scattered colonic diverticula. Normal terminal ileum and appendix. Normal small bowel. Vascular/Lymphatic: Aortic atherosclerosis. IVC filter is appropriately positioned. No abdominopelvic adenopathy. Reproductive: Mild prostatomegaly. Other: No significant free fluid. No free intraperitoneal air. Mild pelvic anasarca. tiny bilateral fat containing inguinal hernias. Musculoskeletal: Degenerative partial fusion of bilateral sacroiliac joints in. Degenerate disc disease at the lumbosacral junction. IMPRESSION: 1. Bilateral, large volume right greater than left pulmonary emboli on this nondedicated  exam. No evidence of right heart strain. 2. Mild peripheral ground-glass opacities, favoring atelectasis or infarct. Atypical infection felt less likely. 3. Otherwise, no explanation for fever. 4.  Aortic Atherosclerosis (ICD10-I70.0). 5. Prostatomegaly. These results will be called to the ordering clinician or representative by the Radiologist Assistant, and communication documented in the PACS or [employer]. Electronically Signed   By: Jeronimo Greaves M.D.   On: 10/17/2020 14:43   MR BRAIN WO CONTRAST  Result Date: 10/06/2020 CLINICAL DATA:  Cerebral hemorrhage suspected. EXAM: MRI HEAD WITHOUT CONTRAST TECHNIQUE: Multiplanar, multiecho pulse sequences of the brain and surrounding structures were obtained without intravenous contrast. COMPARISON:  Head CT October 06, 2020 FINDINGS: Brain: Similar appearance of intraventricular hemorrhage as well as hemorrhage along prior EVD tract in the right frontal lobe and scattered cortical subarachnoid hemorrhage. Ventricular size remains stable. There is also no significant change in size of the intraparenchymal hemorrhage centered within the left side of the midbrain extending into the thalamic pulvinar and superior cerebellar peduncle. A few small foci of restricted diffusion within left frontal and parietal lobes, corresponding to small acute/subacute infarcts. Vascular: Normal flow voids. Skull and upper cervical spine: Ines Bloomer hole from prior EVD in the right frontal region. Marrow signal characteristics are otherwise maintained. Sinuses/Orbits: Mucous retention cyst in the bilateral maxillary sinuses and trace mucosal thickening throughout the paranasal sinuses. The orbits are maintained. Other: Bilateral mastoid effusion. IMPRESSION: 1. Similar appearance of intraventricular hemorrhage as well as hemorrhage along prior EVD tract in the right frontal lobe and scattered cortical subarachnoid hemorrhage. 2. Ventricular size remains stable. 3. No significant change in  size of intraparenchymal hemorrhage centered within the left side of the midbrain extending into the thalamic pulvinar and superior cerebellar peduncle. 4. A few small foci of restricted diffusion within the left frontal and parietal lobes, corresponding to small acute/subacute infarcts. Electronically Signed   By: Baldemar Lenis M.D.   On: 10/06/2020 17:06   CT ABDOMEN PELVIS W CONTRAST  Result Date: 10/17/2020 CLINICAL DATA:  Fever of unknown origin. EXAM: CT CHEST, ABDOMEN, AND PELVIS WITH CONTRAST TECHNIQUE: Multidetector CT imaging of the chest, abdomen and pelvis was performed following the standard protocol during bolus administration of intravenous contrast. CONTRAST:  OMNIPAQUE IOHEXOL 300 MG/ML  SOLN COMPARISON:  Plain film 10/17/2020.  No prior CTs. FINDINGS: CT CHEST FINDINGS Cardiovascular: Aortic atherosclerosis. Normal heart size, without pericardial effusion. Bilateral  pulmonary emboli are identified on this non dedicated exam. Example in the central right pulmonary artery on 28/3. Branch pulmonary arteries to both upper and lower lobes on 32/3. Within the left upper lobe pulmonary artery branch on 20/3. Mediastinum/Nodes: No mediastinal or hilar adenopathy. Lungs/Pleura: No pleural fluid. Endotracheal tube terminates appropriately. Areas of mild peripheral predominant airspace and ground-glass are indeterminate. Example at the left apex on 22/5 and right lower lobe on 58/5. More dependent bibasilar airspace opacities are likely due to atelectasis. Musculoskeletal: No acute osseous abnormality. CT ABDOMEN PELVIS FINDINGS Hepatobiliary: Mild hepatomegaly at 18.4 cm craniocaudal. Normal gallbladder, without biliary ductal dilatation. Pancreas: Normal, without mass or ductal dilatation. Spleen: Normal in size, without focal abnormality. Adrenals/Urinary Tract: Normal adrenal glands. Bilateral renal sinus cysts, without hydronephrosis. Foley catheter within the urinary bladder.  Stomach/Bowel: Nasogastric tube terminating at the gastric antrum. Rectal catheter in place. Scattered colonic diverticula. Normal terminal ileum and appendix. Normal small bowel. Vascular/Lymphatic: Aortic atherosclerosis. IVC filter is appropriately positioned. No abdominopelvic adenopathy. Reproductive: Mild prostatomegaly. Other: No significant free fluid. No free intraperitoneal air. Mild pelvic anasarca. tiny bilateral fat containing inguinal hernias. Musculoskeletal: Degenerative partial fusion of bilateral sacroiliac joints in. Degenerate disc disease at the lumbosacral junction. IMPRESSION: 1. Bilateral, large volume right greater than left pulmonary emboli on this nondedicated exam. No evidence of right heart strain. 2. Mild peripheral ground-glass opacities, favoring atelectasis or infarct. Atypical infection felt less likely. 3. Otherwise, no explanation for fever. 4.  Aortic Atherosclerosis (ICD10-I70.0). 5. Prostatomegaly. These results will be called to the ordering clinician or representative by the Radiologist Assistant, and communication documented in the PACS or [employer]. Electronically Signed   By: Jeronimo Greaves M.D.   On: 10/17/2020 14:43   IR IVC FILTER PLMT / S&I Lenise Arena GUID/MOD SED  Result Date: 10/08/2020 CLINICAL DATA:  65 year old male with intracranial hemorrhage and bilateral lower extremity deep vein thrombosis. EXAM: 1. ULTRASOUND GUIDANCE FOR VASCULAR ACCESS OF THE RIGHT internal jugular VEIN. 2. IVC VENOGRAM. 3. PERCUTANEOUS IVC FILTER PLACEMENT. ANESTHESIA/SEDATION: The patient was receiving sedation as per the ICU. CONTRAST:  35 mL Omnipaque 300 FLUOROSCOPY TIME:  54 seconds, 59 mGy PROCEDURE: The procedure, risks, benefits, and alternatives were explained to the patient. Questions regarding the procedure were encouraged and answered. The patient understands and consents to the procedure. The patient was prepped with Betadine in a sterile fashion, and a sterile drape was  applied covering the operative field. A sterile gown and sterile gloves were used for the procedure. Local anesthesia was provided with 1% Lidocaine. Under direct ultrasound guidance, a 21 gauge needle was advanced into the right internal jugular vein with ultrasound image documentation performed. After securing access with a micropuncture dilator, a guidewire was advanced into the inferior vena cava. A deployment sheath was advanced over the guidewire. This was utilized to perform IVC venography. The deployment sheath was further positioned in an appropriate location for filter deployment. A Denali IVC filter was then advanced in the sheath. This was then fully deployed in the infrarenal IVC. Final filter position was confirmed with a fluoroscopic spot image. Contrast injection was also performed through the sheath under fluoroscopy to confirm patency of the IVC at the level of the filter. After the procedure the sheath was removed and hemostasis obtained with manual compression. COMPLICATIONS: None. FINDINGS: IVC venography demonstrates a normal caliber IVC with no evidence of thrombus. Renal veins are identified bilaterally. The IVC filter was successfully positioned below the level of the renal veins  and is appropriately oriented. This IVC filter has both permanent and retrievable indications. IMPRESSION: Placement of percutaneous IVC filter in infrarenal IVC. IVC venogram shows no evidence of IVC thrombus and normal caliber of the inferior vena cava. This filter does have both permanent and retrievable indications. PLAN: This IVC filter is potentially retrievable. The patient will be assessed for filter retrieval by Interventional Radiology in approximately 8-12 weeks. Further recommendations regarding filter retrieval, continued surveillance or declaration of device permanence, will be made at that time. Marliss Coots, MD [facility] Radiology Electronically  Signed   By: Marliss Coots MD   On: 10/08/2020 15:08   VAS [place] IVC/ILIAC (VENOUS ONLY)  Result Date: 10/25/2020 IVC/ILIAC STUDY Patient Name:  Walter [name]  Date of Exam:   10/25/2020 Medical Rec #: [id number]        Accession #:    [id number] Date of Birth: 1955/08/02        Patient Gender: M Patient Age:   58Y Exam Location:  [facility] Procedure:      VAS [place] IVC/ILIAC (VENOUS ONLY) Referring Phys: [id number] [employer] [facility] Lashea Goda --------------------------------------------------------------------------------  Indications: Extensive right lower extremity DVT Limitations: Air/bowel gas, patient unable to cooperate and obesity.  Comparison Study: No prior study Performing Technologist: Gertie Fey MHA, RDMS, RVT, RDCS  Examination Guidelines: A complete evaluation includes B-mode imaging, spectral Doppler, color Doppler, and power Doppler as needed of all accessible portions of each vessel. Bilateral testing is considered an integral part of a complete examination. Limited examinations for reoccurring indications may be performed as noted.  IVC/Iliac Findings: +----------+------+--------+--------+    IVC    PatentThrombusComments +----------+------+--------+--------+ IVC Prox         acute           +----------+------+--------+--------+ IVC Mid          acute           +----------+------+--------+--------+ IVC Distal       acute           +----------+------+--------+--------+  +-------------------+---------+-----------+---------+-----------+--------+         CIV        RT-PatentRT-ThrombusLT-PatentLT-ThrombusComments +-------------------+---------+-----------+---------+-----------+--------+ Common Iliac Distal            acute    patent                      +-------------------+---------+-----------+---------+-----------+--------+  +-------------------------+---------+-----------+---------+-----------+--------+            EIV            RT-PatentRT-ThrombusLT-PatentLT-ThrombusComments +-------------------------+---------+-----------+---------+-----------+--------+ External Iliac Vein                  acute    patent                      Distal                                                                    +-------------------------+---------+-----------+---------+-----------+--------+   Summary: IVC/Iliac: There is evidence of acute thrombus involving the IVC. There is no evidence of thrombus involving the left common iliac vein. There is evidence of acute thrombus involving the right common iliac vein. There is no evidence of thrombus involving  the  left external iliac vein. There is evidence of acute thrombus involving the right external iliac vein.  *See table(s) above for measurements and observations.  Electronically signed by Coral Else MD on 10/25/2020 at 6:16:09 PM.    Final    DG CHEST PORT 1 VIEW  Result Date: 10/18/2020 CLINICAL DATA:  Fever. EXAM: PORTABLE CHEST 1 VIEW COMPARISON:  CT 10/17/2020.  Chest x-ray 10/17/2020. FINDINGS: Tracheostomy tube and feeding tube are in stable position. Heart size normal. Low lung volumes. Mild right base subsegment atelectasis/infiltrate. No pleural effusion or pneumothorax. IVC filter noted over the right upper abdomen. Degenerative change thoracic spine. IMPRESSION: 1. Lines and tubes stable position. 2. Mild right base subsegmental atelectasis/infiltrate. Electronically Signed   By: Maisie Fus  Register   On: 10/18/2020 06:04   DG CHEST PORT 1 VIEW  Result Date: 10/17/2020 CLINICAL DATA:  Fever. EXAM: PORTABLE CHEST 1 VIEW COMPARISON:  10/06/2020 FINDINGS: A tracheostomy tube remains in place and overlies the airway. A feeding tube courses into the abdomen with tip not imaged. The cardiomediastinal silhouette is unchanged with normal heart size. Lung aeration has improved with minimal residual opacities in the left greater than right lung bases. No edema, sizable  pleural effusion, pneumothorax is identified. IMPRESSION: Improved lung aeration with minimal residual bibasilar opacities likely reflecting atelectasis. Electronically Signed   By: Sebastian Ache M.D.   On: 10/17/2020 10:33   DG CHEST PORT 1 VIEW  Result Date: 10/06/2020 CLINICAL DATA:  Fever.  Trach present. EXAM: PORTABLE CHEST 1 VIEW COMPARISON:  October 05, 2020. FINDINGS: Slightly improved aeration of the lungs. Improved left basilar opacities. Right basilar opacities appear to have resolved. No visible pleural effusions or pneumothorax. Tracheostomy tube projects midline with the tip at the level of the clavicular heads. Cardiomediastinal silhouette is within normal limits and similar to prior. Calcific atherosclerosis of the aorta. IMPRESSION: Slightly improved lung aeration and left basilar opacities, which could represent atelectasis, aspiration, and/or pneumonia. Electronically Signed   By: Feliberto Harts MD   On: 10/06/2020 11:05   DG CHEST PORT 1 VIEW  Result Date: 10/05/2020 CLINICAL DATA:  Fever. EXAM: PORTABLE CHEST 1 VIEW COMPARISON:  Chest x-ray 10/04/2020. FINDINGS: Tracheostomy tube, feeding tube, right PICC line noted stable position. Cardiomegaly. Mild pulmonary vascular prominence. Mild bibasilar atelectasis/infiltrates. No pleural effusion or pneumothorax. IMPRESSION: 1. Tracheostomy tube, feeding tube, right PICC line stable position. 2.  Low lung volumes with bibasilar atelectasis/infiltrates. Electronically Signed   By: Maisie Fus  Register   On: 10/05/2020 11:04   DG CHEST PORT 1 VIEW  Result Date: 10/04/2020 CLINICAL DATA:  Status post tracheostomy. EXAM: PORTABLE CHEST 1 VIEW COMPARISON:  October 02, 2020. FINDINGS: Stable cardiomediastinal silhouette. Tracheostomy tube is in grossly good position. Feeding tube is noted. Right-sided PICC line is unchanged. No pneumothorax is noted. No pleural effusion is noted. Lungs are clear. Bony thorax is unremarkable. IMPRESSION: Tracheostomy tube  in grossly good position. Electronically Signed   By: Lupita Raider M.D.   On: 10/04/2020 14:29   DG CHEST PORT 1 VIEW  Result Date: 10/02/2020 CLINICAL DATA:  Intubation EXAM: PORTABLE CHEST 1 VIEW COMPARISON:  10/01/2020 FINDINGS: The endotracheal tube terminates above the carina. The enteric tube extends below the left hemidiaphragm. The lung volumes are improved. There is improved aeration at the right lung base with persistent streaky airspace opacities favored to represent atelectasis. There is no pneumothorax. No large pleural effusion. IMPRESSION: 1. Lines and tubes as above. 2. Improving lung volumes. Electronically Signed  By: Katherine Mantle M.D.   On: 10/02/2020 02:25   DG CHEST PORT 1 VIEW  Result Date: 10/02/2020 CLINICAL DATA:  Endotracheal intubation. EXAM: PORTABLE CHEST 1 VIEW COMPARISON:  10/01/2020 FINDINGS: Endotracheal tube tip is in the cervical trachea, measuring about 11.7 cm above the carina. Suggest advancement. Enteric tube is present with tip in the left upper quadrant consistent with location in the upper stomach. Right PICC line with tip over the low SVC region. Heart size and pulmonary vascularity are normal. Shallow inspiration. Infiltration or atelectasis in the right lung base. No pleural effusions. No pneumothorax. IMPRESSION: 1. Endotracheal tube tip is in the cervical trachea. Suggest advancement. 2. Shallow inspiration with infiltration or atelectasis in the right lung base. Electronically Signed   By: Burman Nieves M.D.   On: 10/02/2020 02:12   DG CHEST PORT 1 VIEW  Result Date: 10/01/2020 CLINICAL DATA:  PICC line placement EXAM: PORTABLE CHEST 1 VIEW COMPARISON:  09/29/2020 FINDINGS: Placement of a RIGHT PICC line tip in distal SVC. Feeding extends in the stomach. Low lung volumes unchanged. Basilar atelectasis. IMPRESSION: Introduction of RIGHT PICC line. Electronically Signed   By: Genevive Bi M.D.   On: 10/01/2020 17:38   DG CHEST PORT 1  VIEW  Result Date: 09/29/2020 CLINICAL DATA:  Acute respiratory failure EXAM: PORTABLE CHEST 1 VIEW COMPARISON:  Three days ago FINDINGS: Feeding tube which at least reaches the stomach. Lucency over the left upper quadrant is presumably stomach. Cardiomegaly. Congested appearance of central vessels. No Kerley lines, effusion, or pneumothorax. IMPRESSION: Low volume chest which accentuates cardiomegaly and vascular congestion. Electronically Signed   By: Marnee Spring M.D.   On: 09/29/2020 09:44   DG Swallowing Func-Speech Pathology  Result Date: 10/20/2020 Objective Swallowing Evaluation: Type of Study: MBS-Modified Barium Swallow Study  Patient Details Name: Walter [name] MRN: [id number] Date of Birth: May 03, 1956 Today's Date: 10/20/2020 Time: SLP Start Time (ACUTE ONLY): 0755 -SLP Stop Time (ACUTE ONLY): 0820 SLP Time Calculation (min) (ACUTE ONLY): 25 min Past Medical History: Past Medical History: Diagnosis Date . Atrial flutter (HCC)  . Dizzy 09/25/13 . Near syncope 09/25/13 Past Surgical History: Past Surgical History: Procedure Laterality Date . A-FLUTTER ABLATION N/A 03/09/2020  Procedure: A-FLUTTER ABLATION;  Surgeon: Hillis Range, MD;  Location: [facility] INVASIVE CV LAB;  Service: Cardiovascular;  Laterality: N/A; . CARDIOVERSION N/A 09/27/2013  Procedure: CARDIOVERSION;  Surgeon: Lewayne Bunting, MD;  Location: [facility] ENDOSCOPY;  Service: Cardiovascular;  Laterality: N/A; . IR ANGIO EXTERNAL CAROTID SEL EXT CAROTID UNI R MOD SED  09/26/2020 . IR ANGIO INTRA EXTRACRAN SEL COM CAROTID INNOMINATE BILAT MOD SED  09/26/2020 . IR ANGIO VERTEBRAL SEL VERTEBRAL UNI R MOD SED  09/26/2020 . IR IVC FILTER PLMT / S&I /IMG GUID/MOD SED  10/08/2020 . IR US GUIDE VASC ACCESS RIGHT  09/26/2020 . RETINAL DETACHMENT SURGERY   . right knee arthroscopy   . TEE WITHOUT CARDIOVERSION N/A 09/27/2013  Procedure: TRANSESOPHAGEAL ECHOCARDIOGRAM (TEE);  Surgeon: Lewayne Bunting, MD;  Location: [facility] ENDOSCOPY;  Service: Cardiovascular;   Laterality: N/A; . WRIST SURGERY   HPI: 65 yo male presenting 3/28 with R-sided weakness and slurred speech. Imaging revealed acute hemorrhage centered within the left dorsal midbrain extending into the dorsal pons and inferomedial thalamus with intraventricular extension. EVD placement on 3/29. Extubated 3/30. Decrease in EVD drainage 3/31. Repeat CT shows significant clot in R lateral ventricle. EVD clotted 4/2 and intraventricular TpA administered. Reintubated 4/4. Tracheostomy 4/6. PMH Afib, COVID infection (July 2021),  and retinal detachment.  Recent CXR showed. Mild right base subsegmental  atelectasis/infiltrate.  Subjective: sleepy but did surprisingly awake enough to participate Assessment / Plan / Recommendation CHL IP CLINICAL IMPRESSIONS 10/20/2020 Clinical Impression Patient was tested with PMSV in place.  He presents with moderate oral and funcitonal pharyngeal swallow ability.  He was sleeping in chair prior to MBS but did wake adequately to accept po intake and held cup with left hand and brought to mouth.  Lingual weakness and discoordination due to hypoglossal nerve deficit results in premature spillage of liquids and delayed oral transiting with puree/cracker bolus.  Pt's pharyngeal swallow triggers at vallecula with boluses and was overall strong. NO aspiration or penetration observed. Pt masticated tablet despite cues to swallow whole - due to his cognition.  Largest aspiration/malnutrition risk is due to oral deficits and his lethargy. Encourage calorie count to assure he is meeting nutritional needs and assess for oral retention after po.  Will follow up for dysphagia, dysarthria and cognitive linguistic treatment. SLP Visit Diagnosis Dysphagia, oral phase (R13.11) Attention and concentration deficit following -- Frontal lobe and executive function deficit following -- Impact on safety and function Moderate aspiration risk;Risk for inadequate nutrition/hydration   CHL IP TREATMENT  RECOMMENDATION 10/20/2020 Treatment Recommendations Therapy as outlined in treatment plan below   Prognosis 10/20/2020 Prognosis for Safe Diet Advancement Good Barriers to Reach Goals -- Barriers/Prognosis Comment -- CHL IP DIET RECOMMENDATION 10/20/2020 SLP Diet Recommendations Dysphagia 1 (Puree) solids;Thin liquid Liquid Administration via Straw;Cup Medication Administration Crushed with puree Compensations Slow rate;Small sips/bites Postural Changes Remain semi-upright after after feeds/meals (Comment);Seated upright at 90 degrees   CHL IP OTHER RECOMMENDATIONS 10/20/2020 Recommended Consults -- Oral Care Recommendations Oral care QID Other Recommendations Have oral suction available;Place PMSV during PO intake;Clarify dietary restrictions   CHL IP FOLLOW UP RECOMMENDATIONS 10/20/2020 Follow up Recommendations Inpatient Rehab   CHL IP FREQUENCY AND DURATION 10/20/2020 Speech Therapy Frequency (ACUTE ONLY) min 2x/week Treatment Duration 2 weeks      CHL IP ORAL PHASE 10/20/2020 Oral Phase Impaired Oral - Pudding Teaspoon -- Oral - Pudding Cup -- Oral - Honey Teaspoon -- Oral - Honey Cup -- Oral - Nectar Teaspoon Weak lingual manipulation;Premature spillage;Delayed oral transit Oral - Nectar Cup -- Oral - Nectar Straw Weak lingual manipulation Oral - Thin Teaspoon Weak lingual manipulation;Premature spillage Oral - Thin Cup Premature spillage;Weak lingual manipulation Oral - Thin Straw Weak lingual manipulation;Premature spillage Oral - Puree Lingual pumping;Premature spillage;Delayed oral transit Oral - Mech Soft Lingual pumping;Delayed oral transit;Impaired mastication;Premature spillage Oral - Regular -- Oral - Multi-Consistency -- Oral - Pill Other (Comment);Lingual pumping;Weak lingual manipulation;Delayed oral transit Oral Phase - Comment pt masticated tablet  CHL IP PHARYNGEAL PHASE 10/20/2020 Pharyngeal Phase WFL Pharyngeal- Pudding Teaspoon -- Pharyngeal -- Pharyngeal- Pudding Cup -- Pharyngeal -- Pharyngeal-  Honey Teaspoon -- Pharyngeal -- Pharyngeal- Honey Cup -- Pharyngeal -- Pharyngeal- Nectar Teaspoon -- Pharyngeal -- Pharyngeal- Nectar Cup -- Pharyngeal -- Pharyngeal- Nectar Straw -- Pharyngeal -- Pharyngeal- Thin Teaspoon -- Pharyngeal -- Pharyngeal- Thin Cup -- Pharyngeal -- Pharyngeal- Thin Straw -- Pharyngeal -- Pharyngeal- Puree -- Pharyngeal -- Pharyngeal- Mechanical Soft -- Pharyngeal -- Pharyngeal- Regular -- Pharyngeal -- Pharyngeal- Multi-consistency -- Pharyngeal -- Pharyngeal- Pill -- Pharyngeal -- Pharyngeal Comment --  CHL IP CERVICAL ESOPHAGEAL PHASE 10/20/2020 Cervical Esophageal Phase WFL Pudding Teaspoon -- Pudding Cup -- Honey Teaspoon -- Honey Cup -- Nectar Teaspoon -- Nectar Cup -- Nectar Straw -- Thin Teaspoon -- Thin Cup -- Thin Straw --  Puree -- Mechanical Soft -- Regular -- Multi-consistency -- Pill -- Cervical Esophageal Comment -- Rolena Infanteammy K, MS [facility] SLP Acute Rehab Services Office [phone] Pager [phone] Chales AbrahamsKimball, Tamara Ann 10/20/2020, 9:02 AM              EEG adult  Result Date: 10/13/2020 Charlsie QuestYadav, Priyanka O, MD     10/13/2020 12:24 PM Patient Name: Walter BeaverSteven L [name] MRN: [id number] Epilepsy Attending: Charlsie QuestPriyanka O Yadav Referring Physician/Provider: Dr. Marvel PlanJindong Xu Date: 10/13/2020 Duration: 23.47 minutes Patient history: [age] old male with left upper extremity shaking. EEG done for seizures. Level of alertness: Awake AEDs during EEG study: None Technical aspects: This EEG study was done with scalp electrodes positioned according to the 10-20 International system of electrode placement. Electrical activity was acquired at a sampling rate of 500Hz  and reviewed with a high frequency filter of 70Hz  and a low frequency filter of 1Hz . EEG data were recorded continuously and digitally stored. Description: No clear posterior dominant rhythm was seen.  EEG showed continuous generalized 6 to 8 Hz theta-alpha activity as well as intermittent generalized sharply contoured 2 to 3 Hz delta slowing.   Hyperventilation and photic stimulation were not performed.   ABNORMALITY -Continuous slow, generalized IMPRESSION: This study is suggestive of moderate diffuse encephalopathy, nonspecific etiology. No seizures or definite epileptiform discharges were seen throughout the recording. Charlsie QuestPriyanka O Yadav   EEG adult  Result Date: 10/02/2020 Charlsie QuestYadav, Priyanka O, MD     10/02/2020  2:25 PM Patient Name: Walter [name] MRN: [id number] Epilepsy Attending: Charlsie QuestPriyanka O Yadav Referring Physician/Provider: Dr. Marvel PlanJindong Xu Date: 10/02/2020 Duration: 32.31 mins Patient history: 65 year old male with intraparenchymal and intraventricular hemorrhage status post right parietal ventriculostomy.  EEG to evaluate for seizures. Level of alertness: Comatose AEDs during EEG study: None Technical aspects: This EEG study was done with scalp electrodes positioned according to the 10-20 International system of electrode placement. Electrical activity was acquired at a sampling rate of 500Hz  and reviewed with a high frequency filter of 70Hz  and a low frequency filter of 1Hz . EEG data were recorded continuously and digitally stored. Description: EEG showed continuous generalized and maximal right temporal region polymorphic sharply contoured 3 to 6 Hz theta-delta slowing.  Hyperventilation and photic stimulation were not performed.   ABNORMALITY -Continuous slow, generalized and maximal right temporal region. IMPRESSION: This study is suggestive of cortical dysfunction in right temporal region likely secondary to underlying structural abnormality as well as moderate to severe diffuse encephalopathy, nonspecific etiology.  No seizures or epileptiform discharges were seen throughout the recording. Priyanka Annabelle Harman Yadav   Overnight EEG with video  Result Date: 10/04/2020 Charlsie QuestYadav, Priyanka O, MD     10/04/2020  8:46 AM Patient Name: Walter [name] MRN: [id number] Epilepsy Attending: Charlsie QuestPriyanka O Yadav Referring Physician/Provider: Dr Brooke DareSrishti Bhagat Duration:  10/03/2020 2139 to 10/04/2020 0730 Patient history: [age] old male with left upper extremity shaking.  EEG done for seizures. Level of alertness:  lethargic AEDs during EEG study: [place] Technical aspects: This EEG study was done with scalp electrodes positioned according to the 10-20 International system of electrode placement. Electrical activity was acquired at a sampling rate of 500Hz  and reviewed with a high frequency filter of 70Hz  and a low frequency filter of 1Hz . EEG data were recorded continuously and digitally stored. Description: No clear posterior dominant rhythm was seen. EEG showed continuous generalized 3 to 6 Hz theta-delta slowing.  Intermittent generalized periodic discharges with triphasic morphology were noted at 1.5 to 2.5 Hz.  Hyperventilation and  photic stimulation were not performed.   ABNORMALITY -Continuous slow, generalized -Periodic discharges with triphasic morphology, generalized IMPRESSION: This study showed periodic discharges with triphasic morphology at 1.5 to 2.5 Hz which is on the ictal-interictal continuum with low to intermediate potential for seizures.  Additionally there is evidence of moderate to severe diffuse encephalopathy, nonspecific etiology.  No seizures were seen during the study. [name]   CT VENOGRAM ABD/PEL  Result Date: 10/27/2020 CLINICAL DATA:  65 year old with bilateral lower extremity DVT. Evaluate for IVC involvement. EXAM: CT VENOGRAM ABDOMEN AND PELVIS TECHNIQUE: Multidetector CT imaging of the abdomen and pelvis was performed using the standard protocol during bolus administration of intravenous contrast. Multiplanar reconstructed images and MIPs were obtained and reviewed to evaluate the vascular anatomy. CONTRAST:  OMNIPAQUE IOHEXOL 350 MG/ML SOLN COMPARISON:  10/17/2020 FINDINGS: VASCULAR Arterial: Normal caliber of the abdominal aorta with atherosclerotic disease. No evidence for aortic aneurysm. Although this is not a CTA examination,  there is flow in the celiac trunk, SMA, bilateral renal arteries and IMA. Bilateral iliac arteries and proximal femoral arteries are patent bilaterally. Venous: IVC filter positioned just below the renal veins. The infrarenal IVC is expanded with a large amount of thrombus. Greater than 70% of the infrarenal IVC contains thrombus. There is thrombus within the filter that probably extends up to the filter retrieval hook. Bilateral renal veins are patent. Suprarenal IVC is patent. Left common and external iliac veins are patent without definite thrombus. There appears to be some nonocclusive thrombus involving the distal left common femoral vein. There is clot at the IVC bifurcation. No significant clot in the right common iliac vein but there is expansile nonocclusive thrombus in the right external iliac vein. There is thrombus involving the proximal right femoral veins including the profunda femoral vein and the proximal femoral veins. 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 Main portal venous system is patent. Review of the MIP images confirms the above findings. NON-VASCULAR Lower chest: Small right pleural effusion is new. Probable trace left pleural fluid. Compressive atelectasis at both lung bases. Hepatobiliary: Normal appearance of the and gallbladder. No biliary dilatation. Pancreas: Unremarkable. No pancreatic ductal dilatation or surrounding inflammatory changes. Spleen: Normal in size without focal abnormality. Adrenals/Urinary Tract: Normal adrenal glands. Bilateral parapelvic renal cysts. No hydronephrosis. Moderate amount of fluid in the urinary bladder. Stomach/Bowel: Orogastric feeding tube that terminates near the duodenal bulb. No evidence for bowel dilatation or focal inflammation. Multiple diverticula involving the sigmoid colon. Appendix has a normal appearance and contains oral contrast. Lymphatic: No abdominal or pelvic lymphadenopathy. Reproductive: No acute  abnormality to the prostate. Other: Increased presacral edema. Subcutaneous edema. No significant free fluid in the abdomen or pelvis. Negative for free air. Small umbilical hernia containing fat. Musculoskeletal: Disc space narrowing at L5-S1. No acute bone abnormality. IMPRESSION: VASCULAR 1. Positive for thrombus involving the IVC and iliac veins. Large amount of thrombus involving the infrarenal IVC and IVC filter. Suprarenal IVC is patent. Thrombus in the right iliac veins and thrombus in the proximal femoral veins bilaterally. 2.  Aortic Atherosclerosis (ICD10-I70.0). NON-VASCULAR 1. New small right pleural effusion. Compressive atelectasis at the lung bases. 2. Subcutaneous edema with increased presacral edema. 3. Bilateral parapelvic renal cysts without hydronephrosis. 4. Colonic diverticulosis without acute colonic inflammation. Electronically Signed   By: Richarda Overlie M.D.   On: 10/27/2020 09:55   VAS [place] LOWER EXTREMITY VENOUS (DVT)  Result Date: 10/25/2020  Lower Venous DVT Study Patient  Name:  Walter [name]  Date of Exam:   10/25/2020 Medical Rec #: [id number]        Accession #:    [id number] Date of Birth: September 05, 1955        Patient Gender: M Patient Age:   65Y Exam Location:  [facility] Procedure:      VAS [place] LOWER EXTREMITY VENOUS (DVT) Referring Phys: [id number] [facility] Alexandros Ewan --------------------------------------------------------------------------------  Indications: Recent right lower extremity DVT with worsening right lower extremity edema.  Limitations: Patient unable to cooperate. Comparison Study: 10/08/2020 lower extremity venous duplex- extensive right lower                   extremity acute DVT Performing Technologist: Gertie Fey MHA, RDMS, RVT, RDCS  Examination Guidelines: A complete evaluation includes B-mode imaging, spectral Doppler, color Doppler, and power Doppler as needed of all accessible portions of each vessel. Bilateral testing is considered an integral part of  a complete examination. Limited examinations for reoccurring indications may be performed as noted. The reflux portion of the exam is performed with the patient in reverse Trendelenburg.  +---------+---------------+---------+-----------+----------+--------------+ RIGHT    CompressibilityPhasicitySpontaneityPropertiesThrombus Aging +---------+---------------+---------+-----------+----------+--------------+ CFV      None                    No                   Acute          +---------+---------------+---------+-----------+----------+--------------+ SFJ      None                                         Acute          +---------+---------------+---------+-----------+----------+--------------+ FV Prox  None                                         Acute          +---------+---------------+---------+-----------+----------+--------------+ FV Mid   None                                         Acute          +---------+---------------+---------+-----------+----------+--------------+ FV DistalNone                                         Acute          +---------+---------------+---------+-----------+----------+--------------+ PFV      None                                         Acute          +---------+---------------+---------+-----------+----------+--------------+ POP      None                    No                   Acute          +---------+---------------+---------+-----------+----------+--------------+ PTV      None  Acute          +---------+---------------+---------+-----------+----------+--------------+ PERO     None                                         Acute          +---------+---------------+---------+-----------+----------+--------------+ Soleal   None                                         Acute          +---------+---------------+---------+-----------+----------+--------------+ Gastroc   None                                         Acute          +---------+---------------+---------+-----------+----------+--------------+   +----+---------------+---------+-----------+----------+-----------------+ LEFTCompressibilityPhasicitySpontaneityPropertiesThrombus Aging    +----+---------------+---------+-----------+----------+-----------------+ CFV Partial                 Yes                  Age Indeterminate +----+---------------+---------+-----------+----------+-----------------+     Summary: RIGHT: - Findings consistent with acute deep vein thrombosis involving the right common femoral vein, SF junction, right femoral vein, right proximal profunda vein, right popliteal vein, right posterior tibial veins, right peroneal veins, right soleal veins, and right gastrocnemius veins. - No cystic structure found in the popliteal fossa.  LEFT: - Findings consistent with age indeterminate deep vein thrombosis involving the left common femoral vein.  *See table(s) above for measurements and observations. Electronically signed by Coral Else MD on 10/25/2020 at 6:15:59 PM.    Final    VAS [place] LOWER EXTREMITY VENOUS (DVT)  Result Date: 10/08/2020  Lower Venous DVT Study Indications: Fever.  Risk Factors: Immobility Surgery Ventriculostomy post hemorrhagic CVA Recent CVA, Afib (not on blood thinner prior to admission). Comparison Study: No previous exams Performing Technologist: Ernestene Mention  Examination Guidelines: A complete evaluation includes B-mode imaging, spectral Doppler, color Doppler, and power Doppler as needed of all accessible portions of each vessel. Bilateral testing is considered an integral part of a complete examination. Limited examinations for reoccurring indications may be performed as noted. The reflux portion of the exam is performed with the patient in reverse Trendelenburg.  +---------+---------------+---------+-----------+----------+--------------+ RIGHT     CompressibilityPhasicitySpontaneityPropertiesThrombus Aging +---------+---------------+---------+-----------+----------+--------------+ CFV      Full           Yes      Yes                                 +---------+---------------+---------+-----------+----------+--------------+ SFJ      Full                                                        +---------+---------------+---------+-----------+----------+--------------+ FV Prox  None           No       No                   Acute          +---------+---------------+---------+-----------+----------+--------------+  FV Mid   None           No       No                   Acute          +---------+---------------+---------+-----------+----------+--------------+ FV DistalNone           No       No                   Acute          +---------+---------------+---------+-----------+----------+--------------+ PFV      None           No       No                   Acute          +---------+---------------+---------+-----------+----------+--------------+ POP      None           No       No                   Acute          +---------+---------------+---------+-----------+----------+--------------+ PTV      None           No       No                   Acute          +---------+---------------+---------+-----------+----------+--------------+ PERO     None           No       No                   Acute          +---------+---------------+---------+-----------+----------+--------------+ Gastroc  None           No       No                   Acute          +---------+---------------+---------+-----------+----------+--------------+   +---------+---------------+---------+-----------+----------+-----------------+ LEFT     CompressibilityPhasicitySpontaneityPropertiesThrombus Aging    +---------+---------------+---------+-----------+----------+-----------------+ CFV      Full           Yes      Yes                                     +---------+---------------+---------+-----------+----------+-----------------+ SFJ      Full                                                           +---------+---------------+---------+-----------+----------+-----------------+ FV Prox  Full           Yes      Yes                                    +---------+---------------+---------+-----------+----------+-----------------+ FV Mid   Full           Yes      Yes                                    +---------+---------------+---------+-----------+----------+-----------------+  FV DistalFull           Yes      Yes                                    +---------+---------------+---------+-----------+----------+-----------------+ PFV      Full                                                           +---------+---------------+---------+-----------+----------+-----------------+ POP      None           No       No                   Age Indeterminate +---------+---------------+---------+-----------+----------+-----------------+ PTV      None           No       No                   Age Indeterminate +---------+---------------+---------+-----------+----------+-----------------+ PERO     None           No       No                   Age Indeterminate +---------+---------------+---------+-----------+----------+-----------------+ Gastroc  Full                                                           +---------+---------------+---------+-----------+----------+-----------------+    Summary: BILATERAL: -No evidence of popliteal cyst, bilaterally. RIGHT: - Findings consistent with acute deep vein thrombosis involving the right femoral vein, right proximal profunda vein, right popliteal vein, right posterior tibial veins, right peroneal veins, and right gastrocnemius veins.  LEFT: - Findings consistent with age indeterminate deep vein thrombosis involving the left popliteal vein, left  posterior tibial veins, and left peroneal veins.  *See table(s) above for measurements and observations. Electronically signed by Waverly Ferrari MD on 10/08/2020 at 4:04:14 PM.    Final    [place] EKG Site Rite  Result Date: 10/01/2020 If Site Rite image not attached, placement could not be confirmed due to current cardiac rhythm.   Microbiology: No results found for this or any previous visit (from the past 240 hour(s)).   Labs: Basic Metabolic Panel: Recent Labs  Lab 10/22/20 0326 10/23/20 0751 10/25/20 0212 10/27/20 0144 10/28/20 0226  NA 146* 140 133* 132* 133*  K 4.1 4.4 4.5 4.1 4.0  CL 112* 107 101 99 101  CO2 26 28 23 25 24   GLUCOSE 136* 153* 119* 98 137*  BUN 24* 22 20 13 12   CREATININE 0.78 0.82 0.72 0.65 0.68  CALCIUM 8.7* 8.7* 8.8* 8.7* 8.4*   Liver Function Tests: Recent Labs  Lab 10/22/20 0326 10/27/20 1053  AST 37 39  ALT 79* 57*  ALKPHOS 116 95  BILITOT 0.8 0.8  PROT 5.6* 5.7*  ALBUMIN 2.5* 2.9*   No results for input(s): LIPASE, AMYLASE in the last 168 hours. No results for input(s): AMMONIA in the last 168 hours. CBC: Recent Labs  Lab 10/22/20 0326 10/23/20 0751  10/25/20 0212 10/27/20 0144 10/28/20 0226  WBC 8.2 8.4 8.3 7.2  --   NEUTROABS 5.3 5.8 5.3  --   --   HGB 8.9* 9.2* 9.0* 9.2* 8.3*  HCT 28.9* 29.6* 28.7* 29.2* 25.6*  MCV 102.8* 102.4* 102.1* 100.7*  --   PLT 150 175 181 199  --    Cardiac Enzymes: No results for input(s): CKTOTAL, CKMB, CKMBINDEX, TROPONINI in the last 168 hours. BNP: BNP (last 3 results) No results for input(s): BNP in the last 8760 hours.  ProBNP (last 3 results) No results for input(s): PROBNP in the last 8760 hours.  CBG: Recent Labs  Lab 10/27/20 1636 10/27/20 2003 10/27/20 2335 10/28/20 0313 10/28/20 0805  GLUCAP 108* 156* 111* 167* 101*       Signed:  Johnelle Tafolla  [facility] 10/28/2020, 9:54 AM

## 2020-10-29 DIAGNOSIS — I613 Nontraumatic intracerebral hemorrhage in brain stem: Secondary | ICD-10-CM

## 2020-10-29 LAB — COMPREHENSIVE METABOLIC PANEL
ALT: 45 U/L — ABNORMAL HIGH (ref 0–44)
AST: 27 U/L (ref 15–41)
Albumin: 2.9 g/dL — ABNORMAL LOW (ref 3.5–5.0)
Alkaline Phosphatase: 84 U/L (ref 38–126)
Anion gap: 8 (ref 5–15)
BUN: 7 mg/dL — ABNORMAL LOW (ref 8–23)
CO2: 25 mmol/L (ref 22–32)
Calcium: 8.6 mg/dL — ABNORMAL LOW (ref 8.9–10.3)
Chloride: 100 mmol/L (ref 98–111)
Creatinine, Ser: 0.68 mg/dL (ref 0.61–1.24)
GFR, Estimated: >60 mL/min (ref >60)
Glucose, Bld: 122 mg/dL — ABNORMAL HIGH (ref 70–99)
Potassium: 4 mmol/L (ref 3.5–5.1)
Sodium: 133 mmol/L — ABNORMAL LOW (ref 135–145)
Total Bilirubin: 0.9 mg/dL (ref 0.3–1.2)
Total Protein: 5.9 g/dL — ABNORMAL LOW (ref 6.5–8.1)

## 2020-10-29 LAB — GLUCOSE, CAPILLARY
Glucose-Capillary: 107 mg/dL — ABNORMAL HIGH (ref 70–99)
Glucose-Capillary: 115 mg/dL — ABNORMAL HIGH (ref 70–99)
Glucose-Capillary: 116 mg/dL — ABNORMAL HIGH (ref 70–99)
Glucose-Capillary: 120 mg/dL — ABNORMAL HIGH (ref 70–99)
Glucose-Capillary: 127 mg/dL — ABNORMAL HIGH (ref 70–99)
Glucose-Capillary: 131 mg/dL — ABNORMAL HIGH (ref 70–99)
Glucose-Capillary: 99 mg/dL (ref 70–99)

## 2020-10-29 LAB — CBC WITH DIFFERENTIAL/PLATELET
Abs Immature Granulocytes: 0.03 10*3/uL (ref 0.00–0.07)
Basophils Absolute: 0 10*3/uL (ref 0.0–0.1)
Basophils Relative: 1 %
Eosinophils Absolute: 0.4 10*3/uL (ref 0.0–0.5)
Eosinophils Relative: 7 %
HCT: 28.3 % — ABNORMAL LOW (ref 39.0–52.0)
Hemoglobin: 9 g/dL — ABNORMAL LOW (ref 13.0–17.0)
Immature Granulocytes: 1 %
Lymphocytes Relative: 21 %
Lymphs Abs: 1.3 10*3/uL (ref 0.7–4.0)
MCH: 31.8 pg (ref 26.0–34.0)
MCHC: 31.8 g/dL (ref 30.0–36.0)
MCV: 100 fL (ref 80.0–100.0)
Monocytes Absolute: 0.5 10*3/uL (ref 0.1–1.0)
Monocytes Relative: 8 %
Neutro Abs: 3.8 10*3/uL (ref 1.7–7.7)
Neutrophils Relative %: 62 %
Platelets: 227 10*3/uL (ref 150–400)
RBC: 2.83 MIL/uL — ABNORMAL LOW (ref 4.22–5.81)
RDW: 15.6 % — ABNORMAL HIGH (ref 11.5–15.5)
WBC: 6.1 10*3/uL (ref 4.0–10.5)
nRBC: 0 % (ref 0.0–0.2)

## 2020-10-29 MED ORDER — OXYCODONE HCL 5 MG PO TABS: 5.0000 mg | ORAL_TABLET | Freq: Two times a day (BID) | ORAL | Status: DC | PRN

## 2020-10-29 MED ORDER — HYDROCORTISONE 1 % EX CREA
TOPICAL_CREAM | Freq: Two times a day (BID) | CUTANEOUS | Status: DC
  Administered 2020-11-04: 1 via TOPICAL
  Filled 2020-10-29: qty 28

## 2020-10-29 MED ORDER — CLONAZEPAM 0.125 MG PO TBDP
0.1250 mg | ORAL_TABLET | Freq: Every day | ORAL | Status: DC
  Administered 2020-10-29 – 2020-11-04 (×7): 0.125 mg via ORAL
  Filled 2020-10-29 (×7): qty 1

## 2020-10-29 NOTE — Progress Notes (Signed)
Inpatient Rehabilitation Medication Review by a Pharmacist   A complete drug regimen review was completed for this patient to identify any potential clinically significant medication issues.   Clinically significant medication issues were identified:  yes     Type of Medication Issue Identified Description of Issue Urgent (address now) Non-Urgent (address on AM team rounds) Plan Plan Accepted by Provider? (Yes / No / Pending AM Rounds)  Drug Interaction(s) (clinically significant)            Duplicate Therapy            Allergy            No Medication Administration End Date            Incorrect Dose            Additional Drug Therapy Needed   Quetiapine continued at discharge for mild agitation (patient was not taking prior to inpatient admission) -Per discharge summary "Continue low-dose Seroquel nightly and can likely be weaned as he progresses in rehab" Non-urgent Secure chat sent to Dr. Carlis Abbott to consider resuming if patient still agitated MD aware, will resume as appropriate  Other                Name of provider notified for issues identified: Dr. Carlis Abbott   Provider Method of Notification: Secure chat   Pharmacist comments: Consider resuming quetiapine 12.5 mg daily at bedtime if patient still experiencing agitation and follow up plan for weaning while in CIR if resumed (since not PTA med) as per discharge summary   Time spent performing this drug regimen review (minutes):  15     Walter [name], PharmD PGY1 Pharmacy Resident 10/29/2020 8:35 AM   Please check [email or web address] for unit-specific pharmacy phone numbers.

## 2020-10-29 NOTE — Evaluation (Signed)
Physical Therapy Assessment and Plan  Patient Details  Name: Walter [name] MRN: [id number] Date of Birth: 1955/11/22  PT Diagnosis: Abnormal posture, Abnormality of gait, Cognitive deficits, Difficulty walking, Hemiparesis dominant, Impaired cognition and Muscle weakness Rehab Potential: Good ELOS: ~4 weeks   Today's Date: 10/29/2020 PT Individual Time: 1310-1406 PT Individual Time Calculation (min): 56 min    Hospital Problem: Principal Problem:   ICH (intracerebral hemorrhage) ([place])   Past Medical History:  Past Medical History:  Diagnosis Date  . Atrial flutter ([place])   . Dizzy 09/25/13  . Near syncope 09/25/13   Past Surgical History:  Past Surgical History:  Procedure Laterality Date  . A-FLUTTER ABLATION N/A 03/09/2020   Procedure: A-FLUTTER ABLATION;  Surgeon: Thompson Grayer, MD;  Location: [place] [facility];  Service: Cardiovascular;  Laterality: N/A;  . CARDIOVERSION N/A 09/27/2013   Procedure: CARDIOVERSION;  Surgeon: Lelon Perla, MD;  Location: [facility] ENDOSCOPY;  Service: Cardiovascular;  Laterality: N/A;  . IR ANGIO EXTERNAL CAROTID SEL EXT CAROTID UNI R MOD SED  09/26/2020  . IR ANGIO INTRA EXTRACRAN SEL COM CAROTID INNOMINATE BILAT MOD SED  09/26/2020  . IR ANGIO VERTEBRAL SEL VERTEBRAL UNI R MOD SED  09/26/2020  . IR IVC FILTER PLMT / S&I /IMG GUID/MOD SED  10/08/2020  . IR US GUIDE VASC ACCESS RIGHT  09/26/2020  . RETINAL DETACHMENT SURGERY    . right knee arthroscopy    . TEE WITHOUT CARDIOVERSION N/A 09/27/2013   Procedure: TRANSESOPHAGEAL ECHOCARDIOGRAM (TEE);  Surgeon: Lelon Perla, MD;  Location: [facility] ENDOSCOPY;  Service: Cardiovascular;  Laterality: N/A;  . WRIST SURGERY      Assessment & Plan Clinical Impression: Patient is a 65 y.o. right-handed male history of atrial fibrillation with ablation not on anticoagulation followed by Dr. Rayann Heman.  Patient lives with spouse independent prior to admission working as a Cabin crew.  Presented 09/17/2020 with right side  weakness headache and dysarthria as well as vomiting.    Cranial CT/MRI showed acute intraparenchymal hemorrhage in dorsal left pons with subarachnoid extension into the cerebral aqueduct and fourth ventricle.  CT angiogram head and neck no vascular malformation, anomaly or aneurysm.  Echocardiogram with ejection fraction of 55 to 69% grade 1 diastolic dysfunction no regional wall motion abnormalities.  Admission chemistries unremarkable aside glucose 169 alcohol negative urine drug screen negative.  Patient did require emergent intubation for airway protection.  Hospital course complicated by hydrocephalus with diagnostic cerebral angiogram completed showing no evidence of aneurysm.  EVD was placed per neurosurgery.  Maintained on 3% hypertonic saline.  Patient was extubated 09/28/2020.  Follow-up cardiology services for atrial fibrillation maintained on Cardizem.  Patient was cleared to begin Lovenox for DVT prophylaxis 09/28/2020.  Repeat head CT 09/29/2020 showed new intraventricular hemorrhage in the third and lateral ventricles.  Fourth ventricular and midbrain hemorrhage unchanged and again repeated 10/24/2020 showing improving hemorrhage and edema along the shunt tract in the right frontal lobe.  No new area of hemorrhage..  Patient with long-term ventilatory support undergone tracheostomy 10/04/2020 per Dr.Chand critical care services.  On 10/08/2020 patient found to have bilateral lower extremity DVTs and underwent placement of IVC filter placement per interventional radiology.  Dopplers again completed 10/25/2020 showing acute DVT right common femoral vein and SF junction right femoral vein and right proximal profunda vein right popliteal vein and right posterior tibial vein right peroneal vein right soleal vein and right gastrocnemius vein it was established the patient had an IVC filter maintained and Eliquis  was also initiated.  Patient with fever of unknown origin infectious disease consulted broad-spectrum  antibiotics  initiated.  SARS coronavirus negative.  CT chest abdomen pelvis did show bilateral large volume right greater than left pulmonary emboli no evidence of right heart strain and it was discussed to continue IVC filter as well as Eliquis.  Interventional radiology consulted to discuss possible removal of IVC filter with CT venogram 10/27/2020 showed thrombus involving the IVC and iliac veins.  Large amount of thrombus involving the infrarenal IVC and IVC filter.  Suprarenal IVC was patent.  Thrombus in the right iliac veins and thrombus in the proximal femoral veins bilaterally and plan was to undergo thrombectomy and retrieval of IVC filter 10/27/2020 per Dr. Vernard Gambles of interventional radiology..  Patient initially n.p.o. with alternative means of nutritional support advanced to dysphagia #3 thin liquids and also discussion of possible need for PEG tube.  Therapy evaluations were completed due to patient decreased functional mobility was admitted for a comprehensive rehab program. Very eager to go home. Wife at bedside. Patient transferred to CIR on 10/28/2020 .   Patient currently requires +2 max assist with mobility secondary to muscle weakness and muscle joint tightness, decreased cardiorespiratoy endurance, impaired timing and sequencing, abnormal tone, unbalanced muscle activation, decreased coordination and decreased motor planning, decreased visual acuity, decreased visual perceptual skills and decreased visual motor skills,  , decreased initiation, decreased attention, decreased awareness, decreased problem solving, decreased safety awareness, decreased memory and delayed processing and decreased sitting balance, decreased standing balance, decreased postural control, hemiplegia and decreased balance strategies.  Prior to hospitalization, patient was independent  with mobility and lived with Spouse in a House home.  Home access is  Stairs to enter.  Patient will benefit from skilled PT  intervention to maximize safe functional mobility, minimize fall risk and decrease caregiver burden for planned discharge home with 24 hour assist.  Anticipate patient will benefit from follow up OP at discharge.  PT - End of Session Activity Tolerance: Tolerates 30+ min activity with multiple rests Endurance Deficit: Yes Endurance Deficit Description: lethargic throughout session and requires seated rest breaks prior to completing mobility tasks PT Assessment Rehab Potential (ACUTE/IP ONLY): Good PT Barriers to Discharge: Home environment access/layout;New oxygen;Trach PT Patient demonstrates impairments in the following area(s): Balance;Perception;Behavior;Safety;Edema;Sensory;Motor;Endurance;Skin Integrity;Nutrition;Pain PT Transfers Functional Problem(s): Bed Mobility;Bed to Chair;Car;Furniture PT Locomotion Functional Problem(s): Ambulation;Wheelchair Mobility PT Plan PT Intensity: Minimum of 1-2 x/day ,45 to 90 minutes PT Frequency: 5 out of 7 days PT Duration Estimated Length of Stay: ~4 weeks PT Treatment/Interventions: Community reintegration;Ambulation/gait training;DME/adaptive equipment instruction;Neuromuscular re-education;Psychosocial support;Stair training;UE/LE Strength taining/ROM;Wheelchair propulsion/positioning;Balance/vestibular training;Discharge planning;Functional electrical stimulation;Pain management;Skin care/wound management;Therapeutic Activities;UE/LE Coordination activities;Cognitive remediation/compensation;Disease management/prevention;Functional mobility training;Patient/family education;Splinting/orthotics;Therapeutic Exercise;Visual/perceptual remediation/compensation PT Transfers Anticipated Outcome(s): CGA PT Locomotion Anticipated Outcome(s): CGA using LRAD PT Recommendation Recommendations for Other Services: Therapeutic Recreation consult Therapeutic Recreation Interventions: Outing/community reintergration;Stress management Follow Up Recommendations:  Outpatient PT;24 hour supervision/assistance Patient destination: Home Equipment Recommended: To be determined   PT Evaluation Precautions/Restrictions Precautions Precautions: Fall;Other (comment) Precaution Comments: NG tube, trach, PMSV, R hemiparesis, hx of DVTs and PEs Restrictions Weight Bearing Restrictions: No Pain Pain Assessment Pain Scale: 0-10 Pain Score: 0-No pain Home Living/Prior Functioning Home Living Available Help at Discharge: Family;Available 24 hours/day Type of Home: House Home Access: Stairs to enter [employer] of Steps: 5 Entrance Stairs-Rails: Right;Left Home Layout: Two level;Able to live on main level with bedroom/bathroom Additional Comments: Information provided by chart, increased time needed to communicate with pt due to  quiet voice and difficultly with understanding his speech  Lives With: Spouse Prior Function Level of Independence: Independent with gait;Independent with transfers;Independent with homemaking with ambulation  Able to Take Stairs?: Yes Driving: Yes 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[employer], broadcasting/film/video Perception: Impaired Body Part Identification: difficulty determining R from L Praxis Praxis: Impaired Praxis Impairment Details: Initiation;Motor planning (delayed processing)  Cognition Overall Cognitive Status: Impaired/Different from baseline Arousal/Alertness: Lethargic Orientation Level: Oriented to person;Disoriented to place;Disoriented to time;Disoriented to situation Attention: Focused;Sustained Focused Attention: Appears intact Sustained Attention: Impaired Memory: Impaired Awareness: Impaired Problem Solving: Impaired Behaviors: Impulsive;Restless Safety/Judgment: Impaired Comments: decreased insight into deficits impacting safety Sensation Sensation Light Touch: Appears Intact Hot/Cold: Not tested Proprioception: Impaired by gross assessment Stereognosis: Not tested Coordination Gross  Motor Movements are Fluid and Coordinated: No Coordination and Movement Description: impaired trunk control and R hemiparesis Motor  Motor Motor: Hemiplegia;Abnormal postural alignment and control Motor - Skilled Clinical Observations: R hemiparesis   Trunk/Postural Assessment  Cervical Assessment Cervical Assessment: Exceptions to [facility] (forward head) Thoracic Assessment Thoracic Assessment: Exceptions to [facility] (mild kyphosis with rounded shoulders) Lumbar Assessment Lumbar Assessment: Exceptions to [facility] (posterior pelvic tilt) Postural Control Postural Control: Deficits on evaluation Trunk Control: impaired Righting Reactions: delayed Postural Limitations: decreaesd due to impaired trunk control  Balance Balance Balance Assessed: Yes Static Sitting Balance Static Sitting - Balance Support: Feet supported Static Sitting - Level of Assistance: 4: Min assist;3: Mod assist Dynamic Sitting Balance Dynamic Sitting - Balance Support: Feet supported Dynamic Sitting - Level of Assistance: 2: Max assist Static Standing Balance Static Standing - Balance Support: Bilateral upper extremity supported Static Standing - Level of Assistance: Other (comment) (+2 max assist) Extremity Assessment      RLE Assessment RLE Assessment: Exceptions to [facility] Passive Range of Motion (PROM) Comments: WFL RLE Strength Right Hip Flexion: 2+/5 Right Hip ABduction: 2+/5 Right Hip ADduction: 2+/5 Right Knee Flexion: 2-/5 Right Knee Extension: 3-/5 Right Ankle Dorsiflexion: 2+/5 Right Ankle Plantar Flexion: 3-/5 LLE Assessment LLE Assessment: Exceptions to [facility] Passive Range of Motion (PROM) Comments: WFL LLE Strength Left Hip Flexion: 4-/5 Left Hip ABduction: 4-/5 Left Hip ADduction: 4-/5 Left Knee Flexion: 4/5 Left Knee Extension: 4+/5 Left Ankle Dorsiflexion: 4/5 Left Ankle Plantar Flexion: 4/5  Care Tool Care Tool Bed Mobility Roll left and right activity   Roll left and right assist level:  Moderate Assistance - Patient 50 - 74%    Sit to lying activity   Sit to lying assist level: Maximal Assistance - Patient 25 - 49%    Lying to sitting edge of bed activity   Lying to sitting edge of bed assist level: Maximal Assistance - Patient 25 - 49%     Care Tool Transfers Sit to stand transfer   Sit to stand assist level: 2 Helpers    Chair/bed transfer   Chair/bed transfer assist level: 2 Pension scheme manager transfer   Assist Level: 2 Helpers (stedy)    Scientist, product/process development transfer activity did not occur: Safety/medical concerns        Care Tool Locomotion Ambulation Ambulation activity did not occur: Safety/medical concerns        Walk 10 feet activity Walk 10 feet activity did not occur: Safety/medical concerns       Walk 50 feet with 2 turns activity Walk 50 feet with 2 turns activity did not occur: Safety/medical concerns      Walk 150 feet activity Walk 150 feet activity did not occur:  Safety/medical concerns      Walk 10 feet on uneven surfaces activity Walk 10 feet on uneven surfaces activity did not occur: Safety/medical concerns      Stairs Stair activity did not occur: Safety/medical concerns        Walk up/down 1 step activity Walk up/down 1 step or curb (drop down) activity did not occur: Safety/medical concerns     Walk up/down 4 steps activity did not occuR: Safety/medical concerns  Walk up/down 4 steps activity      Walk up/down 12 steps activity Walk up/down 12 steps activity did not occur: Safety/medical concerns      Pick up small objects from floor Pick up small object from the floor (from standing position) activity did not occur: Safety/medical concerns      Wheelchair Will patient use wheelchair at discharge?:  (TBD)          Wheel 50 feet with 2 turns activity      Wheel 150 feet activity        Refer to Care Plan for Long Term Goals  SHORT TERM GOAL WEEK 1 PT Short Term Goal 1 (Week 1): Pt will consistently perform  supine<>sit with mod assist PT Short Term Goal 2 (Week 1): Pt will perform sit<>stands using LRAD with +2 mod assist PT Short Term Goal 3 (Week 1): Pt will perform bed<>chair transfers using LRAD with +2 mod assist PT Short Term Goal 4 (Week 1): Pt will tolerate sitting upright, OOB in TIS w/c for at least 2 hours outside of therapy sessions PT Short Term Goal 5 (Week 1): Pt will ambulate at least 37f using LRAD with +2 mod assist  Recommendations for other services: Therapeutic Recreation  Stress management  Skilled Therapeutic Intervention Evaluation completed (see details above) with patient education regarding purpose of PT evaluation, PT POC and goals, therapy schedule, weekly team meetings, and other CIR information including safety plan and fall risk safety. Pt received supine in bed and agreeable to therapy session. Pt with very soft voice throughout session making it difficult for therapist to hear. Pt not oriented to location nor date but also pt very lethargic throughout session; however, despite lethargy and confusion patient also very motivated to participate in therapy. Pt performed the below mobility tasks with the specified levels of assistance. At beginning of session pt reported need to use bathroom therefore performed via +2 assist with stedy for safety - continent of bladder and bowels. At end of session pt left supine in bed with needs in reach, lines intact, and bed alarm on with pt falling asleep.  Mobility Bed Mobility Bed Mobility: Supine to Sit;Sit to Supine Supine to Sit: Moderate Assistance - Patient 50-74%;Maximal Assistance - Patient - Patient 25-49% Sit to Supine: Moderate Assistance - Patient 50-74%;Maximal Assistance - Patient 25-49% Transfers Transfers: Sit to Stand;Stand to Sit;Squat Pivot Transfers;Transfer via LGeophysicist/field seismologistSit to Stand: 2 Helpers;Maximal Assistance - Patient 25-49% (3 Musketeer support) Stand to Sit: 2 Helpers;Maximal Assistance - Patient  25-49% (3 Musketeer support) Squat Pivot Transfers: 2 Helpers;Maximal Assistance - Patient 25-49% Transfer (Assistive device): None Transfer via Lift Equipment: Stedy (+2 mod assist using stedy) Locomotion  Gait Ambulation: No (attempted pre-gait; however, pt too fatigued) Gait Gait: No Stairs / Additional Locomotion Stairs: No Wheelchair Mobility Wheelchair Mobility: No (pt currently requiring TIS w/c for trunk control)   Discharge Criteria: Patient will be discharged from PT if patient refuses treatment 3 consecutive times without medical reason, if treatment goals not met,  if there is a change in medical status, if patient makes no progress towards goals or if patient is discharged from hospital.  The above assessment, treatment plan, treatment alternatives and goals were discussed and mutually agreed upon: by patient  Tawana Scale , PT, DPT, CSRS  10/29/2020, 12:19 PM

## 2020-10-29 NOTE — Progress Notes (Signed)
PROGRESS NOTE   Subjective/Complaints: Patient's chart reviewed- No issues reported overnight Vitals signs stable Appreciate pharmacy's communication- can restart seroquel HS if agitated- does not appear to be an issue last night   Objective:   No results found. Recent Labs    10/27/20 0144 10/28/20 0226 10/29/20 0706  WBC 7.2  --  6.1  HGB 9.2* 8.3* 9.0*  HCT 29.2* 25.6* 28.3*  PLT 199  --  227   Recent Labs    10/28/20 1845 10/29/20 0706  NA 134* 133*  K 4.0 4.0  CL 101 100  CO2 25 25  GLUCOSE 107* 122*  BUN 8 7*  CREATININE 0.68 0.68  CALCIUM 8.6* 8.6*    Intake/Output Summary (Last 24 hours) at 10/29/2020 0845 Last data filed at 10/28/2020 2358 Gross per 24 hour  Intake --  Output 1125 ml  Net -1125 ml     Pressure Injury 10/21/20 Head Left;Posterior Stage 1 -  Intact skin with non-blanchable redness of a localized area usually over a bony prominence. Pink non-blanchable scalp with thinning hair on posterior left side of head (Active)  10/21/20 1100  Location: Head  Location Orientation: Left;Posterior  Staging: Stage 1 -  Intact skin with non-blanchable redness of a localized area usually over a bony prominence.  Wound Description (Comments): Pink non-blanchable scalp with thinning hair on posterior left side of head  Present on Admission: No    Physical Exam: Vital Signs Blood pressure 118/68, pulse 91, temperature 98.5 F (36.9 C), temperature source Oral, resp. rate 18, weight 92.7 kg, SpO2 98 %. Gen: no distress, normal appearing HEENT: oral mucosa pink and moist, NCAT, Tracheostomy tube in place Cardio: Reg rate Chest: normal effort, normal rate of breathing Abd: soft, non-distended Ext: no edema Psych: pleasant, normal affect, joking Skin: intact    Comments: Patient is agitated and restless.  +NGT.  He does make eye contact with examiner follows some basic commands. 4/5 strength  throughout. Poor judgement and safety awareness   Assessment/Plan: 1. Functional deficits which require 3+ hours per day of interdisciplinary therapy in a comprehensive inpatient rehab setting.  Physiatrist is providing close team supervision and 24 hour management of active medical problems listed below.  Physiatrist and rehab team continue to assess barriers to discharge/monitor patient progress toward functional and medical goals  Care Tool:  Bathing              Bathing assist       Upper Body Dressing/Undressing Upper body dressing        Upper body assist Assist Level: Moderate Assistance - Patient 50 - 74%    Lower Body Dressing/Undressing Lower body dressing            Lower body assist Assist for lower body dressing: Moderate Assistance - Patient 50 - 74%     Toileting Toileting    Toileting assist Assist for toileting: Dependent - Patient 0%     Transfers Chair/bed transfer  Transfers assist  Chair/bed transfer activity did not occur: Safety/medical concerns        Locomotion Ambulation   Ambulation assist  Walk 10 feet activity   Assist           Walk 50 feet activity   Assist           Walk 150 feet activity   Assist           Walk 10 feet on uneven surface  activity   Assist           Wheelchair     Assist               Wheelchair 50 feet with 2 turns activity    Assist            Wheelchair 150 feet activity     Assist          Blood pressure 118/68, pulse 91, temperature 98.5 F (36.9 C), temperature source Oral, resp. rate 18, weight 92.7 kg, SpO2 98 %.  Medical Problem List and Plan: 1.  Right side weakness and slurred speech secondary to intracerebral hemorrhage complicated by hydrocephalus.  Status post EVD placed per neurosurgery             -patient may not shower             -ELOS/Goals: 3 weeks S             -Initial CIR evals today.  2.   Acute PE/bilateral lower extremity DVT:  Status post IVC filter 10/08/2020 with thrombectomy and retrieval of IVC filter 10/28/2020 per interventional radiology -DVT/anticoagulation: Continue Eliquis             -antiplatelet therapy: N/A 3. Pain Management: Decrease oxycodone to q12H prn 4. Mood: Amantadine 200 mg twice daily             -antipsychotic agents: N/A 5. Neuropsych: This patient is not capable of making decisions on his own behalf. 6. Skin/Wound Care: Routine skin checks 7. Fluids/Electrolytes/Nutrition: Routine in and outs with follow-up chemistries 8.  PAF.  Status post ablation cardioversion in the Past.  Continue Eliquis.  Cardio exam 30 mg every 6 hours.  Cardiac rate controlled 9.  Acute hypoxic respiratory failure.  Tracheostomy 10/04/2020 per DrChand.  Presently with a #6 Shiley.  PMV as tolerated.  Follow-up speech therapy. Daily weights.  10.  Hospital-acquired pneumonia/ventilator associated pneumonia.  Infectious disease follow-up antibiotic therapy completed 11.  Dysphagia.  Dysphagia #1 thin liquids.  Discussing plans for possible PEG tube. Will need assistance with eating 12.  Hyperglycemia related to tube feeds.  Lantus insulin 6 units daily.  Continue SSI.  Hemoglobin A1c was 5.7             5/1: CBGs 99-115, due to TF. Wean Lantus as weans off TF.     LOS: 1 days A FACE TO FACE EVALUATION WAS PERFORMED  Cystal Shannahan P Dmario Russom 10/29/2020, 8:45 AM

## 2020-10-29 NOTE — Plan of Care (Signed)
  Problem: Sit to Stand Goal: LTG:  Patient will perform sit to stand in prep for activites of daily living with assistance level (OT) Description: LTG:  Patient will perform sit to stand in prep for activites of daily living with assistance level (OT) Flowsheets (Taken 10/29/2020 1518) LTG: PT will perform sit to stand in prep for activites of daily living with assistance level: Supervision/Verbal cueing   Problem: RH Eating Goal: LTG Patient will perform eating w/assist, cues/equip (OT) Description: LTG: Patient will perform eating with assist, with/without cues using equipment (OT) Flowsheets (Taken 10/29/2020 1518) LTG: Pt will perform eating with assistance level of: Supervision/Verbal cueing   Problem: RH Grooming Goal: LTG Patient will perform grooming w/assist,cues/equip (OT) Description: LTG: Patient will perform grooming with assist, with/without cues using equipment (OT) Flowsheets (Taken 10/29/2020 1518) LTG: Pt will perform grooming with assistance level of: Supervision/Verbal cueing   Problem: RH Bathing Goal: LTG Patient will bathe all body parts with assist levels (OT) Description: LTG: Patient will bathe all body parts with assist levels (OT) Flowsheets (Taken 10/29/2020 1518) LTG: Pt will perform bathing with assistance level/cueing: Minimal Assistance - Patient > 75%   Problem: RH Dressing Goal: LTG Patient will perform upper body dressing (OT) Description: LTG Patient will perform upper body dressing with assist, with/without cues (OT). Flowsheets (Taken 10/29/2020 1518) LTG: Pt will perform upper body dressing with assistance level of: Supervision/Verbal cueing Goal: LTG Patient will perform lower body dressing w/assist (OT) Description: LTG: Patient will perform lower body dressing with assist, with/without cues in positioning using equipment (OT) Flowsheets (Taken 10/29/2020 1518) LTG: Pt will perform lower body dressing with assistance level of: (excluding Teds)  Minimal  Assistance - Patient > 75%  Other (Comment)   Problem: RH Toileting Goal: LTG Patient will perform toileting task (3/3 steps) with assistance level (OT) Description: LTG: Patient will perform toileting task (3/3 steps) with assistance level (OT)  Flowsheets (Taken 10/29/2020 1518) LTG: Pt will perform toileting task (3/3 steps) with assistance level: Minimal Assistance - Patient > 75%   Problem: RH Toilet Transfers Goal: LTG Patient will perform toilet transfers w/assist (OT) Description: LTG: Patient will perform toilet transfers with assist, with/without cues using equipment (OT) Flowsheets (Taken 10/29/2020 1518) LTG: Pt will perform toilet transfers with assistance level of: Supervision/Verbal cueing

## 2020-10-29 NOTE — Evaluation (Signed)
Occupational Therapy Assessment and Plan  Patient Details  Name: Walter [name] MRN: [id number] Date of Birth: 05/02/1956  OT Diagnosis: abnormal posture, cognitive deficits, disturbance of vision, hemiplegia affecting dominant side, muscle weakness (generalized) and coordination disorder Rehab Potential: Rehab Potential (ACUTE ONLY): Excellent ELOS: 3-4 weeks   Today's Date: 10/29/2020 OT Individual Time: 4920-1007 OT Individual Time Calculation (min): 68 min     Hospital Problem: Principal Problem:   ICH (intracerebral hemorrhage) ([place])   Past Medical History:  Past Medical History:  Diagnosis Date  . Atrial flutter ([place])   . Dizzy 09/25/13  . Near syncope 09/25/13   Past Surgical History:  Past Surgical History:  Procedure Laterality Date  . A-FLUTTER ABLATION N/A 03/09/2020   Procedure: A-FLUTTER ABLATION;  Surgeon: Thompson Grayer, MD;  Location: [place] [facility];  Service: Cardiovascular;  Laterality: N/A;  . CARDIOVERSION N/A 09/27/2013   Procedure: CARDIOVERSION;  Surgeon: Lelon Perla, MD;  Location: [facility] ENDOSCOPY;  Service: Cardiovascular;  Laterality: N/A;  . IR ANGIO EXTERNAL CAROTID SEL EXT CAROTID UNI R MOD SED  09/26/2020  . IR ANGIO INTRA EXTRACRAN SEL COM CAROTID INNOMINATE BILAT MOD SED  09/26/2020  . IR ANGIO VERTEBRAL SEL VERTEBRAL UNI R MOD SED  09/26/2020  . IR IVC FILTER PLMT / S&I /IMG GUID/MOD SED  10/08/2020  . IR US GUIDE VASC ACCESS RIGHT  09/26/2020  . RETINAL DETACHMENT SURGERY    . right knee arthroscopy    . TEE WITHOUT CARDIOVERSION N/A 09/27/2013   Procedure: TRANSESOPHAGEAL ECHOCARDIOGRAM (TEE);  Surgeon: Lelon Perla, MD;  Location: [facility] ENDOSCOPY;  Service: Cardiovascular;  Laterality: N/A;  . WRIST SURGERY      Assessment & Plan Clinical Impression: Walter [name] is a 65 year old right-handed male history of atrial fibrillation with ablation not on anticoagulation followed by Dr. Rayann Heman.  Patient lives with spouse independent prior to  admission working as a Cabin crew.  Presented 09/17/2020 with right side weakness headache and dysarthria as well as vomiting.    Cranial CT/MRI showed acute intraparenchymal hemorrhage in dorsal left pons with subarachnoid extension into the cerebral aqueduct and fourth ventricle.  CT angiogram head and neck no vascular malformation, anomaly or aneurysm.  Echocardiogram with ejection fraction of 55 to 12% grade 1 diastolic dysfunction no regional wall motion abnormalities.  Admission chemistries unremarkable aside glucose 169 alcohol negative urine drug screen negative.  Patient did require emergent intubation for airway protection.  Hospital course complicated by hydrocephalus with diagnostic cerebral angiogram completed showing no evidence of aneurysm.  EVD was placed per neurosurgery.  Maintained on 3% hypertonic saline.  Patient was extubated 09/28/2020.  Follow-up cardiology services for atrial fibrillation maintained on Cardizem.  Patient was cleared to begin Lovenox for DVT prophylaxis 09/28/2020.  Repeat head CT 09/29/2020 showed new intraventricular hemorrhage in the third and lateral ventricles.  Fourth ventricular and midbrain hemorrhage unchanged and again repeated 10/24/2020 showing improving hemorrhage and edema along the shunt tract in the right frontal lobe.  No new area of hemorrhage..  Patient with long-term ventilatory support undergone tracheostomy 10/04/2020 per Dr.Chand critical care services.  On 10/08/2020 patient found to have bilateral lower extremity DVTs and underwent placement of IVC filter placement per interventional radiology.  Dopplers again completed 10/25/2020 showing acute DVT right common femoral vein and SF junction right femoral vein and right proximal profunda vein right popliteal vein and right posterior tibial vein right peroneal vein right soleal vein and right gastrocnemius vein it was established the patient  had an IVC filter maintained and Eliquis was also initiated.  Patient with  fever of unknown origin infectious disease consulted broad-spectrum antibiotics  initiated.  SARS coronavirus negative.  CT chest abdomen pelvis did show bilateral large volume right greater than left pulmonary emboli no evidence of right heart strain and it was discussed to continue IVC filter as well as Eliquis.  Interventional radiology consulted to discuss possible removal of IVC filter with CT venogram 10/27/2020 showed thrombus involving the IVC and iliac veins.  Large amount of thrombus involving the infrarenal IVC and IVC filter.  Suprarenal IVC was patent.  Thrombus in the right iliac veins and thrombus in the proximal femoral veins bilaterally and plan was to undergo thrombectomy and retrieval of IVC filter 10/27/2020 per Dr. Vernard Gambles of interventional radiology..  Patient initially n.p.o. with alternative means of nutritional support advanced to dysphagia #3 thin liquids and also discussion of possible need for PEG tube.  Therapy evaluations were completed due to patient decreased functional mobility was admitted for a comprehensive rehab program. Very eager to go home. Wife at bedside.  Patient currently requires total with basic self-care skills secondary to muscle weakness, decreased cardiorespiratoy endurance and decreased oxygen support, impaired timing and sequencing, unbalanced muscle activation, decreased coordination and decreased motor planning, visual deficits, decreased midline orientation, decreased initiation, decreased awareness, decreased problem solving, decreased safety awareness, decreased memory and delayed processing and decreased sitting balance, decreased standing balance, decreased postural control and hemiplegia.  Prior to hospitalization, patient could complete BADLs with independent .  Patient will benefit from skilled intervention to increase independence with basic self-care skills prior to discharge home with care partner.  Anticipate patient will require 24 hour supervision  and minimal physical assistance and follow up home health.  OT - End of Session Endurance Deficit: Yes Endurance Deficit Description: Pt diaphoretic after standing in the [facility] for toileting, reporting feeling very tired post stands OT Assessment Rehab Potential (ACUTE ONLY): Excellent OT Barriers to Discharge: Home environment access/layout;Trach;Nutrition means;New oxygen OT Patient demonstrates impairments in the following area(s): Balance;Behavior;Cognition;Endurance;Nutrition;Motor;Perception;Safety;Skin Integrity;Vision OT Basic ADL's Functional Problem(s): Eating;Grooming;Bathing;Dressing;Toileting OT Advanced ADL's Functional Problem(s): Simple Meal Preparation OT Transfers Functional Problem(s): Toilet;Tub/Shower OT Additional Impairment(s): Fuctional Use of Upper Extremity OT Plan OT Intensity: Minimum of 1-2 x/day, 45 to 90 minutes OT Frequency: 5 out of 7 days OT Duration/Estimated Length of Stay: 3-4 weeks OT Treatment/Interventions: Balance/vestibular training;DME/adaptive equipment instruction;Patient/family education;Therapeutic Activities;Cognitive remediation/compensation;Wheelchair propulsion/positioning;Psychosocial support;Therapeutic Exercise;UE/LE Strength taining/ROM;Self Care/advanced ADL retraining;Functional mobility training;Community reintegration;Discharge planning;Neuromuscular re-education;UE/LE Coordination activities;Visual/perceptual remediation/compensation;Splinting/orthotics;Pain management;Disease mangement/prevention OT Self Feeding Anticipated Outcome(s): Supervision/cuing OT Basic Self-Care Anticipated Outcome(s): Supervision-Min A OT Toileting Anticipated Outcome(s): Min A OT Bathroom Transfers Anticipated Outcome(s): Supervision-CGA OT Recommendation Recommendations for Other Services:  (none) Patient destination: Home Follow Up Recommendations: Home health OT Equipment Recommended: To be determined   OT Evaluation Precautions/Restrictions   Precautions Precaution Comments: NG tube, trach, PMSV General   Vital Signs   Pain Pain Assessment Pain Scale: 0-10 Pain Score: 0-No pain Home Living/Prior Functioning Home Living Family/patient expects to be discharged to:: Private residence Available Help at Discharge: Family,Available 24 hours/day Type of Home: House Home Access: Stairs to enter Home Layout: Two level,Able to live on main level with bedroom/bathroom Bathroom Shower/Tub: Multimedia programmer: Standard Bathroom Accessibility: Yes Additional Comments: Information provided by chart, increased time needed to communicate with pt due to quiet voice and difficultly with understanding his speech  Lives With: Spouse IADL History Homemaking Responsibilities: No (pt reports wife took care  of homemaking IADL responsibilities, he did drive [employer] and worked as a Interior and spatial designer) 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, states he bought [employer] on [address] and plans to turn this property into apartments Leisure and Hobbies: Spending time with grandchildren Prior Function Level of Independence: Independent with basic ADLs Driving: Yes Vision Baseline Vision/History: Wears glasses (mostly contacts PTA) Wears Glasses: At all times Patient Visual Report: Diplopia Vision Assessment?: Vision impaired- to be further tested in functional context Perception  Perception: Impaired Spatial Orientation: Appeared to have difficultly with orienting UEs in space to meet demands of certain aspects of ADL tasks ?how much of this is attributed to visual deficits Praxis Praxis: Impaired Praxis Impairment Details: Initiation;Motor planning (also delayed processing) Cognition Overall Cognitive Status: Impaired/Different from baseline Arousal/Alertness: Awake/alert Orientation Level: Place;Person;Situation (Grossly oriented to situation, stating he had some issues in his brain, unable to state that he  had an ICH) Person: Oriented Place: Oriented Situation: Oriented Year: Other (Comment) ([id number]) Month: March Day of Week: Incorrect Memory: Impaired Immediate Memory Recall: Sock;Blue;Bed Memory Recall Sock: Without Cue Memory Recall Blue: Without Cue Memory Recall Bed: Without Cue Awareness: Impaired Problem Solving: Impaired Behaviors: Impulsive Safety/Judgment: Impaired (pt impulsive with functional movement, particularly leaning forward during self care tasks, decreased insight into deficits impacting safety) Sensation Coordination Gross Motor Movements are Fluid and Coordinated: No Fine Motor Movements are Fluid and Coordinated: No Coordination and Movement Description: Deficits with trunk control and UE coordination/awareness of UEs in space Finger Nose Finger Test: Very dysmetric, Rt>Lt Motor  Motor Motor: Hemiplegia;Abnormal postural alignment and control  Trunk/Postural Assessment  Cervical Assessment Cervical Assessment: Exceptions to [facility] (forward head) Thoracic Assessment Thoracic Assessment: Exceptions to [facility] (kyphotic) Lumbar Assessment Lumbar Assessment: Exceptions to [facility] (posterior pelvic tilt) Postural Control Postural Control: Deficits on evaluation (delayed righting reactions, limited postural control during seated/supported standing ADL activity)  Balance Balance Balance Assessed: Yes Dynamic Sitting Balance Dynamic Sitting - Balance Support: During functional activity;Feet supported;No upper extremity supported Dynamic Sitting - Level of Assistance: 2: Max assist (donning shirt) Dynamic Standing Balance Dynamic Standing - Balance Support: During functional activity;Left upper extremity supported Dynamic Standing - Level of Assistance: 1: +1 Total assist;1: +2 Total assist (using Stedy lift during toileting, pt initiating assisting with clothing mgt) Dynamic Standing - Balance Activities: Lateral lean/weight shifting;Forward lean/weight  shifting Extremity/Trunk Assessment RUE Assessment RUE Assessment: Exceptions to [facility] Active Range of Motion (AROM) Comments: Shoulder ROM limited to ~150 degrees General Strength Comments: 4-/5 grossly LUE Assessment LUE Assessment: Within Functional Limits Active Range of Motion (AROM) Comments: WNL General Strength Comments: 4+/5 grossly  Care Tool Care Tool Self Care Eating        Oral Care         Bathing   Body parts bathed by patient: Right arm;Left arm;Chest;Abdomen;Front perineal area;Face Body parts bathed by helper: Buttocks;Right upper leg;Left upper leg;Right lower leg;Left lower leg   Assist Level: 2 Helpers    Upper Body Dressing(including orthotics)   What is the patient wearing?: Pull over shirt   Assist Level: Maximal Assistance - Patient 25 - 49%    Lower Body Dressing (excluding footwear)   What is the patient wearing?: Incontinence brief;Pants Assist for lower body dressing: 2 Helpers    Putting on/Taking off footwear   What is the patient wearing?: Non-skid slipper socks;Ted hose Assist for footwear: Dependent - Patient 0%       Care Tool Toileting Toileting activity   Assist for toileting: 2  Helpers (using bariatric Stedy)       Toilet transfer   Assist Level: 2 Helpers (using bariatric Stedy)      Refer to Care Plan for Long Term Goals  SHORT TERM GOAL WEEK 1 OT Short Term Goal 1 (Week 1): Pt will complete BSC or toilet transfer with 1 assist and LRAD OT Short Term Goal 2 (Week 1): Pt will engage in self care or therapeutic activity while sitting unsupported for ~20 minutes with no more than Mod balance assistance OT Short Term Goal 3 (Week 1): Pt will be able to visually scan for 1 ADL item and successfully reach for it with no more than Mod A  Recommendations for other services: None    Skilled Therapeutic Intervention Skilled OT session completed with focus on initial evaluation, education on OT role/POC, and establishment of  patient-centered goals.   Pt greeted in bed, satting at 99-100% via trach collar, 5L fi02 28%. Per RT, ok to increase pt to 10L fi02 40% if he desatted during session. Pt eager to participate and communicate with therapist though voice is very quiet and he is difficult to understand Pt wearing PMSV upon arrival. Supine<sit completed with Min A using the bedrail with HOB elevated. Pt with poor trunk control while sitting up, leaning too far forward/backward and also presenting with a Rt lean. Pt requiring Mod-Max A of 1 for sitting balance overall. A lot of difficultly with functional reach bilaterally, improved a bit when we donned his glasses with tape covering half of the Lt lens but pt still with dysmetria/ataxia when reaching to target. Pt reports having diplopia. +2 Mod A for sit<stand in [place] with very good initiation/motivation from pt to assist. He reported needing to void bowels, Stedy transfer completed to [facility] with pt having BM. Min A of 1 for sit<stand in [place] for completing perihygiene and elevating brief + pants. Pt able to stand for ~30 second windows with CGA before needing seated rest. Note that he is impulsive with standing and leaning forward, also noted some delayed processing. 02 sats throughout session remained between 98-100% on 5L via trach collar. His HR did increase to 125bpm after toileting, pt a bit diaphoretic but reported feeling ok, just tired. He was returned to bed with +2 assist. +2 for safety when returning to supine position. While he was being repositioned for comfort, HR decreased to 108bpm, 02 sats still 99%. He was left with all needs within reach and bed alarm set, very grateful for the opportunity to get up today, asking OT about his ELOS and projected goals. We discussed these subjects together. Pt with excellent rehab potential.    ADL ADL Eating: Not assessed Grooming: Moderate assistance Where Assessed-Grooming: Edge of bed Upper Body Bathing: Moderate  assistance Where Assessed-Upper Body Bathing: Edge of bed Lower Body Bathing: Dependent (+2 assist using Stedy for sit<stand) Where Assessed-Lower Body Bathing: Edge of bed;Other (Comment) (also sit<stand from [facility] using Stedy) Upper Body Dressing: Maximal assistance Lower Body Dressing: Dependent (+2 assist using Stedy) Where Assessed-Lower Body Dressing: Edge of bed;Other (Comment) (sit<stand from [facility] using Stedy) Toileting: Dependent (+2 using Stedy) Where Assessed-Toileting: Bedside Commode Toilet Transfer: Dependent (+2 using Stedy) Toilet Transfer Method: Research officer, trade union: Not assessed   Discharge Criteria: Patient will be discharged from OT if patient refuses treatment 3 consecutive times without medical reason, if treatment goals not met, if there is a change in medical status, if patient makes no progress  towards goals or if patient is discharged from hospital.  The above assessment, treatment plan, treatment alternatives and goals were discussed and mutually agreed upon: by patient  Skeet Simmer 10/29/2020, 12:55 PM

## 2020-10-30 DIAGNOSIS — I611 Nontraumatic intracerebral hemorrhage in hemisphere, cortical: Secondary | ICD-10-CM

## 2020-10-30 DIAGNOSIS — Z430 Encounter for attention to tracheostomy: Secondary | ICD-10-CM

## 2020-10-30 LAB — GLUCOSE, CAPILLARY
Glucose-Capillary: 109 mg/dL — ABNORMAL HIGH (ref 70–99)
Glucose-Capillary: 116 mg/dL — ABNORMAL HIGH (ref 70–99)
Glucose-Capillary: 132 mg/dL — ABNORMAL HIGH (ref 70–99)
Glucose-Capillary: 140 mg/dL — ABNORMAL HIGH (ref 70–99)
Glucose-Capillary: 151 mg/dL — ABNORMAL HIGH (ref 70–99)
Glucose-Capillary: 96 mg/dL (ref 70–99)

## 2020-10-30 MED ORDER — FREE WATER
100.0000 mL | Freq: Four times a day (QID) | Status: DC
  Administered 2020-10-30 – 2020-11-06 (×27): 100 mL

## 2020-10-30 MED ORDER — LIVING WELL WITH DIABETES BOOK
Freq: Once | Status: AC
  Filled 2020-10-30: qty 1

## 2020-10-30 MED ORDER — OSMOLITE 1.5 CAL PO LIQD
1000.0000 mL | ORAL | Status: DC
  Administered 2020-10-30 – 2020-10-31 (×2): 1000 mL
  Filled 2020-10-30 (×2): qty 1000

## 2020-10-30 NOTE — Progress Notes (Signed)
PMR Admission Coordinator Pre-Admission Assessment   Patient: Walter [name] is an 65 y.o., male MRN: [id number] DOB: January 26, 1956 Height: '6\' 2"'  (188 cm) Weight: 100 kg                                                                                                                                                  Insurance Information HMO:     PPO:      PCP:      IPA:      80/20: yes     OTHER:  PRIMARY: Medicare A & B      Policy#: [id number]      Subscriber: patient CM Name:       Phone#:      Fax#:  Pre-Cert#:       Employer:  Benefits:  Phone #: verified eligibility online via [place] on 10/22/20      Name:  Eff. Date: Part A and B effective 08/29/20     Deduct: $1,556    Out of Pocket Max: NA Life Max: NA  CIR: 100%       SNF: 20 full days Outpatient: 80%     Co-Pay: 20% Home Health: 100%      Co-Pay:  DME: 80%     Co-Pay: 20% Providers: pt's choice   SECONDARY: BCBS Supplement      Policy#: [id number]  Phone#: [phone]   Financial Counselor:       Phone#:    The "Data Collection Information Summary" for patients in Inpatient Rehabilitation Facilities with attached "Privacy Act [place] Records" was provided and verbally reviewed with: Patient and Family   Emergency Contact Information         Contact Information     Name Relation Home Work Mobile    Web[place] Spouse     [phone]       Current Medical History  Patient Admitting Diagnosis: Acute hemorrhagic CVA History of Present Illness: Walter [name] is a 65 year old right-handed male history of atrial fibrillation with ablation not on anticoagulation followed by Dr. Rayann Heman.   Presented 09/17/2020 with right side weakness headache and dysarthria as well as vomiting.  Cranial CT/MRI showed acute intraparenchymal hemorrhage in dorsal left pons with subarachnoid extension into the cerebral aqueduct and fourth ventricle.  CT angiogram head and neck no vascular malformation, anomaly or aneurysm.   Echocardiogram with ejection fraction of 55 to 14% grade 1 diastolic dysfunction no regional wall motion abnormalities.  Admission chemistries unremarkable aside glucose 169 alcohol negative urine drug screen negative.  Patient did require emergent intubation for airway protection.  Hospital course complicated by hydrocephalus with diagnostic cerebral angiogram completed showing no evidence of aneurysm.  EVD was placed per neurosurgery.  Maintained on 3% hypertonic saline.  Patient was extubated 09/28/2020.  Follow-up cardiology services for atrial fibrillation maintained on Cardizem.  Patient  was cleared to begin Lovenox for DVT prophylaxis 09/28/2020.  Repeat head CT 09/29/2020 showed new intraventricular hemorrhage in the third and lateral ventricles.  Fourth ventricular and midbrain hemorrhage unchanged and again repeated 10/24/2020 showing improving hemorrhage and edema along the shunt tract in the right frontal lobe.  No new area of hemorrhage..  Patient with long-term ventilatory support undergone tracheostomy 10/04/2020 per Dr.Chand critical care services.  On 10/08/2020 patient found to have bilateral lower extremity DVTs and underwent placement of IVC filter placement per interventional radiology.  Dopplers again completed 10/25/2020 showing acute DVT right common femoral vein and SF junction right femoral vein and right proximal profunda vein right popliteal vein and right posterior tibial vein right peroneal vein right soleal vein and right gastrocnemius vein it was established the patient had an IVC filter maintained and Eliquis was also initiated.  Patient with fever of unknown origin infectious disease consulted broad-spectrum antibiotics  initiated.  SARS coronavirus negative.  CT chest abdomen pelvis did show bilateral large volume right greater than left pulmonary emboli no evidence of right heart strain and it was discussed to continue IVC filter as well as Eliquis.  Interventional radiology consulted to  discuss possible removal of IVC filter with CT venogram 10/27/2020 showed thrombus involving the IVC and iliac veins.  Large amount of thrombus involving the infrarenal IVC and IVC filter.  Suprarenal IVC was patent.  Thrombus in the right iliac veins and thrombus in the proximal femoral veins bilaterally and plan was to undergo thrombectomy and retrieval of IVC filter 10/27/2020 per Dr. Vernard Gambles of interventional radiology.  Patient initially n.p.o. with alternative means of nutritional support advanced to dysphagia #3 thin liquids and also discussion of possible need for PEG tube.  Therapy evaluations were completed due to patient decreased functional mobility was recommended for a comprehensive rehab program.     Complete NIHSS TOTAL: 17 Glasgow Coma Scale Score: 14   Past Medical History      Past Medical History:  Diagnosis Date  . Atrial flutter ([place])    . Dizzy 09/25/13  . Near syncope 09/25/13      Family History  family history includes Healthy in his brother and sister; Heart disease in his father and mother.   Prior Rehab/Hospitalizations:  Has the patient had prior rehab or hospitalizations prior to admission? No   Has the patient had major surgery during 100 days prior to admission? Yes   Current Medications    Current Facility-Administered Medications:  .  0.9 %  sodium chloride infusion, , Intravenous, PRN, Rosalin Hawking, MD, Stopped at 10/19/20 1506 .  acetaminophen (TYLENOL) tablet 650 mg, 650 mg, Per Tube, Q4H PRN, 650 mg at 10/26/20 1204 **OR** [DISCONTINUED] acetaminophen (TYLENOL) 160 MG/5ML solution 650 mg, 650 mg, Per Tube, Q4H PRN, 650 mg at 10/19/20 0411 **OR** acetaminophen (TYLENOL) suppository 650 mg, 650 mg, Rectal, Q4H PRN, Rosalin Hawking, MD .  amantadine (SYMMETREL) 50 MG/5ML solution 200 mg, 200 mg, Per Tube, BID, Rosalin Hawking, MD, 200 mg at 10/27/20 1005 .  apixaban (ELIQUIS) tablet 5 mg, 5 mg, Per Tube, BID, Mikhail, Maryann, DO, 5 mg at 10/27/20 1005 .   chlorhexidine gluconate (MEDLINE KIT) (PERIDEX) 0.12 % solution 15 mL, 15 mL, Mouth Rinse, BID, Greta Doom, MD, 15 mL at 10/27/20 0900 .  Chlorhexidine Gluconate Cloth 2 % PADS 6 each, 6 each, Topical, Daily, Rosalin Hawking, MD, 6 each at 10/27/20 1006 .  diltiazem (CARDIZEM) 10 mg/ml oral suspension 30 mg, 30 mg,  Per Tube, Q6H, Rosalin Hawking, MD, 30 mg at 10/27/20 1157 .  doxazosin (CARDURA) tablet 2 mg, 2 mg, Per Tube, Daily, Adhikari, Amrit, MD, 2 mg at 10/27/20 1005 .  feeding supplement (ENSURE ENLIVE / ENSURE PLUS) liquid 237 mL, 237 mL, Oral, BID BM, Adhikari, Amrit, MD, 237 mL at 10/26/20 1407 .  feeding supplement (PROSource TF) liquid 45 mL, 45 mL, Per Tube, TID, Shelly Coss, MD, 45 mL at 10/26/20 2152 .  feeding supplement (VITAL 1.5 CAL) liquid 960 mL, 960 mL, Per Tube, Q24H, Shelly Coss, MD, Stopped at 10/27/20 0000 .  free water 200 mL, 200 mL, Per Tube, Q4H, Adhikari, Amrit, MD, 200 mL at 10/27/20 0005 .  guaiFENesin-dextromethorphan (ROBITUSSIN DM) 100-10 MG/5ML syrup 5 mL, 5 mL, Per Tube, Q4H PRN, Margaretha Seeds, MD, 5 mL at 10/26/20 2356 .  hydrALAZINE (APRESOLINE) injection 20 mg, 20 mg, Intravenous, Q6H PRN, Briant Cedar, MD, 20 mg at 10/04/20 [phone] .  insulin aspart (novoLOG) injection 0-20 Units, 0-20 Units, Subcutaneous, Q4H, Magdalen Spatz, NP, 3 Units at 10/26/20 2016 .  insulin glargine (LANTUS) injection 6 Units, 6 Units, Subcutaneous, Daily, Jacky Kindle, MD, 6 Units at 10/26/20 [phone] .  MEDLINE mouth rinse, 15 mL, Mouth Rinse, 10 times per day, Greta Doom, MD, 15 mL at 10/27/20 1200 .  metoprolol tartrate (LOPRESSOR) injection 2.5 mg, 2.5 mg, Intravenous, Q6H PRN, Bowser, Grace E, NP, 2.5 mg at 10/20/20 2119 .  mineral oil-hydrophilic petrolatum (AQUAPHOR) ointment, , Topical, BID PRN, Tawanna Solo, Amrit, MD .  ondansetron (ZOFRAN) injection 4 mg, 4 mg, Intravenous, Q6H, Rosalin Hawking, MD, 4 mg at 10/27/20 1157 .  oxyCODONE (Oxy  IR/ROXICODONE) immediate release tablet 5 mg, 5 mg, Per Tube, Q6H PRN, Rosalin Hawking, MD .  QUEtiapine (SEROQUEL) tablet 12.5 mg, 12.5 mg, Oral, QHS, Mikhail, Maryann, DO .  sodium chloride flush (NS) 0.9 % injection 10-40 mL, 10-40 mL, Intracatheter, Q12H, Rosalin Hawking, MD, 10 mL at 10/27/20 1000 .  sodium chloride flush (NS) 0.9 % injection 10-40 mL, 10-40 mL, Intracatheter, PRN, Rosalin Hawking, MD   Patients Current Diet:     Diet Order                      Diet NPO time specified  Diet effective midnight                      Precautions / Restrictions Precautions Precautions: Fall,Other (comment) Precaution Comments: bladder/bowel incontinence; condom cath, Cortrak, trach, PMV; bilateral mitts Other Brace: BUE resting hand splints, see schedule Restrictions Weight Bearing Restrictions: No    Has the patient had 2 or more falls or a fall with injury in the past year?No   Prior Activity Level 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  Independent   Indoor Mobility: Did the patient need assistance with walking from room to room (with or without device)? Independent   Stairs: Did the patient need assistance with internal or external stairs (with or without device)? Independent   Functional Cognition: Did the patient need help planning regular tasks such as shopping or remembering to take medications? Independent   Home Assistive Devices / Equipment Home Assistive Devices/Equipment: None Home Equipment: None   Prior Device Use: Indicate devices/aids used by the  patient prior to current illness, exacerbation or injury? None of the above   Current Functional Level Cognition   Arousal/Alertness: Awake/alert Overall Cognitive Status: Impaired/Different from  baseline Difficult to assess due to: Tracheostomy Current Attention Level: Sustained Orientation Level: Intubated/Tracheostomy - Unable to assess Following Commands: Follows one step commands consistently,Follows one step commands with increased time Safety/Judgement: Decreased awareness of safety,Decreased awareness of deficits General Comments: Pt following majority of direct, simple verbal commands this session; pt becoming more fatigued/lethargic with activity requiring cues to open eyes. Verbalizing well with PMV on, although verbalizing less towards end of session with fatigue and significant coughing Attention: Focused,Sustained,Selective Focused Attention: Appears intact Sustained Attention: Impaired Sustained Attention Impairment: Verbal basic,Functional basic Selective Attention: Impaired Selective Attention Impairment: Verbal basic,Functional basic Awareness: Impaired Awareness Impairment: Intellectual impairment,Emergent impairment Problem Solving: Impaired Problem Solving Impairment: Functional basic    Extremity Assessment (includes Sensation/Coordination)   Upper Extremity Assessment: RUE deficits/detail,LUE deficits/detail RUE Deficits / Details: improving functional use, grasp 3/5, elbow flexion 3-/5, and shoulder flexion 3-/5, requires increased time to motor plan. Able to bring hand to mouth. R weaker than L RUE Sensation: decreased light touch,decreased proprioception RUE Coordination: decreased fine motor,decreased gross motor LUE Deficits / Details: improving functional use, grasp 3/5, elbow flexion 3-/5, and shoulder flexion 3-/5, requires increased time to motor plan. Able to bring hand over head. LUE Sensation: decreased light touch,decreased proprioception LUE Coordination: decreased fine motor,decreased gross motor  Lower Extremity Assessment: Defer to PT evaluation RLE Deficits / Details: grossly 4-/5, pt able to complete SLR, LAQ, DF against mod resistance.  denies difference in sensation RLE Coordination: decreased fine motor,decreased gross motor     ADLs   Overall ADL's : Needs assistance/impaired Eating/Feeding: Maximal assistance Eating/Feeding Details (indicate cue type and reason): assisted with self feeding using R UE and max assist (pureed pineapple), will benefit from built up handles; HOH support x 4 bites with increased effort required Grooming: Wash/dry face,Minimal assistance,Sitting Grooming Details (indicate cue type and reason): to wipe mouth Upper Body Bathing: Moderate assistance,Sitting Lower Body Bathing: Moderate assistance,Bed level,Sit to/from stand Upper Body Dressing : Moderate assistance,Sitting Upper Body Dressing Details (indicate cue type and reason): donning new gown Lower Body Dressing: Moderate assistance Lower Body Dressing Details (indicate cue type and reason): use of figure four for pt to doff socks Toilet Transfer: +2 for physical assistance,+2 for safety/equipment,Maximal assistance,Moderate assistance Toilet Transfer Details (indicate cue type and reason): Max A +2 for pivot to recliner Toileting- Clothing Manipulation and Hygiene: Total assistance,+2 for physical assistance,+2 for safety/equipment,Bed level Toileting - Clothing Manipulation Details (indicate cue type and reason): +2 for maintaining standing and then Total A for peri care after bowel incontenience. Functional mobility during ADLs: Moderate assistance,+2 for physical assistance (sit<>stand with stedy) General ADL Comments: Demonstrating increased activity tolerance     Mobility   Overal bed mobility: Needs Assistance Bed Mobility: Supine to Sit Rolling: Mod assist,+2 for safety/equipment Sidelying to sit: Total assist,+2 for physical assistance Supine to sit: Mod assist,HOB elevated Sit to supine: Max assist,+2 for physical assistance Sit to sidelying: Max assist,+2 for physical assistance General bed mobility comments: Cues for  holding therapists hand. stabilizing pt's knees and then pt pulling into upright position.     Transfers   Overall transfer level: Needs assistance Equipment used: 2 person hand held assist Transfers: Sit to/from [place] to Stand: Mod assist,+2 physical assistance Stand pivot transfers: Max assist,+2 safety/equipment,+2 physical assistance Squat pivot transfers: Total assist,+2 physical assistance  Lateral/Scoot Transfers: Total assist,+2 physical assistance General transfer comment: Mod A +2 for power up into standing. Pt demonstrating good initation to stand. Max A +2 for maintaining balance and pivoting hips     Ambulation / Gait / Stairs / Wheelchair Mobility   Ambulation/Gait Ambulation/Gait assistance: Max assist,+2 physical assistance,+2 safety/equipment Gait Distance (Feet): 3 Feet Assistive device: 2 person hand held assist Gait Pattern/deviations: Step-to pattern,Decreased stride length,Decreased step length - right,Decreased stance time - right,Decreased weight shift to right,Leaning posteriorly General Gait Details: Noted taking minimal pivotal steps during stand pivot transfer with maxA+2 and bilateral HHA Gait velocity: decreased Gait velocity interpretation: <1.31 ft/sec, indicative of household ambulator     Posture / Balance Dynamic Sitting Balance Sitting balance - Comments: Able to maintain brief bouts of static sitting without external assist, noted improvement in anterior weight translation, requiring mod-maxA with fatigued Balance Overall balance assessment: Needs assistance Sitting-balance support: Feet supported,Bilateral upper extremity supported Sitting balance-Leahy Scale: Fair Sitting balance - Comments: Able to maintain brief bouts of static sitting without external assist, noted improvement in anterior weight translation, requiring mod-maxA with fatigued Postural control: Posterior lean Standing balance support: Bilateral upper  extremity supported,During functional activity Standing balance-Leahy Scale: Poor Standing balance comment: Reliant on UE support and external assist to maintain static standing     Special needs/care consideration Trach size #6 Shiley uncuffed, 28% FiO2,  Skin MASD: rectum/bilateral; Pressure injury: head/left posterior; Surgical incision: right neck; Ecchymosis: arm, leg/bilateral;, Diabetic management novoLOG: 0-20 units every 4 hours; Lantus: 6 units daily,  Bladder and Bowel incontinence  Cortrak     Previous Home Environment (from acute therapy documentation) Living Arrangements: Spouse/significant other  Lives With: Spouse Available Help at Discharge: Family,Available 24 hours/day Type of Home: House Home Layout: Two level,Able to live on main level with bedroom/bathroom Home Access: Stairs to enter Entrance Stairs-Rails: Surveyor, mining of Steps: 5 Bathroom Shower/Tub: Multimedia programmer: Standard Bathroom Accessibility: Yes How Accessible: Accessible via walker [place]: No Additional Comments: dog named ruby   Discharge Living Setting Plans for Discharge Living Setting: Patient's home Type of Home at Discharge: House Discharge Home Layout: Two level,Able to live on main level with bedroom/bathroom Discharge Home Access: Stairs to enter Entrance Stairs-Rails: Right,Left Entrance Stairs-Number of Steps: 5 Discharge Bathroom Shower/Tub: Walk-in shower Discharge Bathroom Toilet: Standard Discharge Bathroom Accessibility: Yes How Accessible: Accessible via walker Does the patient have any problems obtaining your medications?: No   Social/Family/Support Systems Anticipated Caregiver: Slater Mcmanaman, wife Anticipated Caregiver's Contact Information: [phone] Caregiver Availability: 24/7 Discharge Plan Discussed with Primary Caregiver: Yes Is Caregiver In Agreement with Plan?: Yes Does Caregiver/Family have Issues with  Lodging/Transportation while Pt is in Rehab?: No     Goals Patient/Family Goal for Rehab: Min A:PT/OT, Mod I-Supervision: ST Expected length of stay: 18-22 days Pt/Family Agrees to Admission and willing to participate: Yes Program Orientation Provided & Reviewed with Pt/Caregiver Including Roles  & Responsibilities: Yes     Decrease burden of Care through IP rehab admission: NA     Possible need for SNF placement upon discharge: Not anticipated     Patient Condition: This patient's medical and functional status has changed since the consult dated: 09/29/2020 in which the Rehabilitation Physician determined and documented that the patient's condition is appropriate for intensive rehabilitative care in an inpatient rehabilitation facility. See "History of Present Illness" (above) for medical update. Functional changes are: max to total +2 with all mobility and ADLs. Patient's medical and functional  status update has been discussed with the Rehabilitation physician and patient remains appropriate for inpatient rehabilitation. Will admit to inpatient rehab Saturday, 10/28/20.   Preadmission Screen Completed By:  Michel Santee, PT, DPT 10/27/2020 12:00 PM ______________________________________________________________________   Discussed status with Dr. Ranell Patrick on 10/27/20 at 12:03 PM and received approval for admission Saturday, 10/28/20.    Admission Coordinator:  Michel Santee, PT, DPT, time 12:03 PM Sudie Grumbling 10/27/20              Cosigned by: Izora Ribas, MD at 10/27/2020 12:39 PM

## 2020-10-30 NOTE — Progress Notes (Signed)
Chief Complaint: Patient was seen today for follow up nearly occlusive caval thrombus   Supervising Physician: Marliss Coots  Patient Status: [facility] - In-pt  Subjective: Patient seen on rehab. Currently on trach collar with PMSV in place.  Wife at bedside.   Objective: Physical Exam: BP 133/70 (BP Location: Left Arm)   Pulse 87   Temp 99.1 F (37.3 C)   Resp 19   Wt 206 lb 12.7 oz (93.8 kg)   SpO2 100%   BMI 26.55 kg/m  NAD, alert, NGT in place Neck: trach collar in place.  Abd: soft, NT MSK: Bilateral LE with some edema but soft, NT    Current Facility-Administered Medications:  .  acetaminophen (TYLENOL) tablet 650 mg, 650 mg, Per Tube, Q4H PRN **OR** acetaminophen (TYLENOL) suppository 650 mg, 650 mg, Rectal, Q4H PRN, Angiulli, Mcarthur Rossetti, PA-C .  amantadine (SYMMETREL) 50 MG/5ML solution 200 mg, 200 mg, Per Tube, BID, Angiulli, Mcarthur Rossetti, PA-C, 200 mg at 10/30/20 0802 .  apixaban (ELIQUIS) tablet 5 mg, 5 mg, Per Tube, BID, Angiulli, Mcarthur Rossetti, PA-C, 5 mg at 10/30/20 0802 .  clonazepam (KLONOPIN) disintegrating tablet 0.125 mg, 0.125 mg, Oral, QHS, Raulkar, Drema Pry, MD, 0.125 mg at 10/29/20 2029 .  diltiazem (CARDIZEM) 10 mg/ml oral suspension 30 mg, 30 mg, Per Tube, Q6H, Angiulli, Mcarthur Rossetti, PA-C, 30 mg at 10/30/20 1418 .  doxazosin (CARDURA) tablet 2 mg, 2 mg, Per Tube, Daily, Angiulli, Mcarthur Rossetti, PA-C, 2 mg at 10/30/20 0802 .  feeding supplement (ENSURE ENLIVE / ENSURE PLUS) liquid 237 mL, 237 mL, Oral, BID BM, Angiulli, Daniel J, PA-C, 237 mL at 10/30/20 1420 .  feeding supplement (OSMOLITE 1.5 CAL) liquid 1,000 mL, 1,000 mL, Per Tube, Q24H, Kirsteins, Victorino Sparrow, MD .  feeding supplement (PROSource TF) liquid 45 mL, 45 mL, Per Tube, TID, Angiulli, Daniel J, PA-C, 45 mL at 10/30/20 1418 .  free water 100 mL, 100 mL, Per Tube, Q6H, Kirsteins, Victorino Sparrow, MD .  guaiFENesin-dextromethorphan (ROBITUSSIN DM) 100-10 MG/5ML syrup 5 mL, 5 mL, Per Tube, Q4H PRN, Angiulli, Mcarthur Rossetti,  PA-C .  hydrocortisone cream 1 %, , Topical, BID, Raulkar, Drema Pry, MD, Given at 10/30/20 [phone]  insulin aspart (novoLOG) injection 0-20 Units, 0-20 Units, Subcutaneous, Q4H, Angiulli, Mcarthur Rossetti, PA-C, 3 Units at 10/30/20 1416 .  insulin glargine (LANTUS) injection 6 Units, 6 Units, Subcutaneous, Daily, Charlton Amor, PA-C, 6 Units at 10/30/20 0802 .  mineral oil-hydrophilic petrolatum (AQUAPHOR) ointment, , Topical, BID PRN, Charlton Amor, PA-C, Given at 10/30/20 0507 .  oxyCODONE (Oxy IR/ROXICODONE) immediate release tablet 5 mg, 5 mg, Per Tube, Q12H PRN, Horton Chin, MD  Labs: CBC Recent Labs    10/28/20 0226 10/29/20 0706  WBC  --  6.1  HGB 8.3* 9.0*  HCT 25.6* 28.3*  PLT  --  227   BMET Recent Labs    10/28/20 1845 10/29/20 0706  NA 134* 133*  K 4.0 4.0  CL 101 100  CO2 25 25  GLUCOSE 107* 122*  BUN 8 7*  CREATININE 0.68 0.68  CALCIUM 8.6* 8.6*   LFT Recent Labs    10/29/20 0706  PROT 5.9*  ALBUMIN 2.9*  AST 27  ALT 45*  ALKPHOS 84  BILITOT 0.9    Assessment/Plan: S/p Inferior venacavogram with mechanical thrombectomy with moderately large residual caval thrombus.  IVC was left in place and remains in good position. Patient stable s/p successful debulking of intrarenal caval thrombosis  10/27/20.  Residual caval thrombus remains.  IVC filter was left in place due to ongoing clot.  Discussed case with Dr. Deanne Coffer and Dr. Elby Showers.  Consider proceeding with additional thrombectomy/debulking later this week.   IVC filter would likely remain in place at that time.  Discussed with patient and wife at bedside.  Discussed the risks and benefits of proceeding with intervention vs. Conservative management with Eliquis which he is currently tolerating.  Patient and wife would like to proceed with intervention.  Discussed with Jesusita Oka, rehab PA.  Ok to proceed with same-day procedure with expected return to rehab for ongoing therapy.  Continue Eliquis for now.   IR will work towards schedule Thurs vs. Friday of this week depending on availability.   Risks and benefits discussed with the patient including, but not limited to bleeding, possible life threatening bleeding and need for blood product transfusion, vascular injury, stroke, contrast induced renal failure, and infection.  All of the patient's questions were answered, patient is agreeable to proceed. Consent signed and in chart.    LOS: 2 days   I spent a total of 30 minutes in face to face in clinical consultation, greater than 50% of which was counseling/coordinating care for caval thrombosis.   Hoyt Koch PA-C 10/30/2020 4:48 PM

## 2020-10-30 NOTE — Progress Notes (Signed)
Physical Medicine and Rehabilitation Consult Reason for Consult: Right side weakness and slurred speech Referring Physician: Dr. Pearlean Brownie     HPI: Walter [name] is a 65 y.o. right-handed male with history of atrial fibrillation with ablation not on anticoagulation followed by Dr. Johney Frame.  History taken from chart review due to distress and mentation.  Patient lives with spouse.  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  He presented on 09/17/2020 with right hemiparesis, headaches, and dysarthria as well as vomiting.  Cranial CT/MRI scan showed acute intraparenchymal hemorrhage in dorsal left pons with subarachnoid extension into the cerebral aqueduct and fourth ventricle.  CT angiogram head and neck no vascular malformation, anomaly or aneurysm.  Echocardiogram with ejection fraction 55-60%, grade 1 diastolic dysfunction no regional wall motion abnormalities.  Admission chemistries unremarkable except glucose 169, alcohol negative, urine drug screen negative.  Patient did require emergent intubation for airway protection.  Hospital course complicated by hydrocephalus with diagnostic cerebral angiogram completed showing no evidence of aneurysm.  EVD was placed by neurosurgery.  Maintained on 3% hypertonic saline.  Patient was extubated 09/28/2020. Follow-up cardiology services for atrial fibrillation maintained on Cardizem.  Patient was cleared to begin Lovenox for DVT prophylaxis 09/28/2020.  Repeat head CT on 09/29/2020: Personally reviewed, showing new intraventricular hemorrhage.  Per report, and third and lateral ventricles.  Currently NPO with alternative means of nutritional support.  Due to patient decreased functional ability right side hemiparesis and cognitive deficits recommendations of physical medicine rehab consult.   Review of Systems  Unable to perform ROS: Acuity of condition        Past Medical History:  Diagnosis Date  . Atrial flutter (HCC)    . Dizzy 09/25/13   . Near syncope 09/25/13         Past Surgical History:  Procedure Laterality Date  . A-FLUTTER ABLATION N/A 03/09/2020    Procedure: A-FLUTTER ABLATION;  Surgeon: Hillis Range, MD;  Location: [facility] INVASIVE CV LAB;  Service: Cardiovascular;  Laterality: N/A;  . CARDIOVERSION N/A 09/27/2013    Procedure: CARDIOVERSION;  Surgeon: Lewayne Bunting, MD;  Location: [facility] ENDOSCOPY;  Service: Cardiovascular;  Laterality: N/A;  . IR ANGIO EXTERNAL CAROTID SEL EXT CAROTID UNI R MOD SED   09/26/2020  . IR ANGIO INTRA EXTRACRAN SEL COM CAROTID INNOMINATE BILAT MOD SED   09/26/2020  . IR ANGIO VERTEBRAL SEL VERTEBRAL UNI R MOD SED   09/26/2020  . IR US GUIDE VASC ACCESS RIGHT   09/26/2020  . RETINAL DETACHMENT SURGERY      . right knee arthroscopy      . TEE WITHOUT CARDIOVERSION N/A 09/27/2013    Procedure: TRANSESOPHAGEAL ECHOCARDIOGRAM (TEE);  Surgeon: Lewayne Bunting, MD;  Location: [facility] ENDOSCOPY;  Service: Cardiovascular;  Laterality: N/A;  . WRIST SURGERY             Family History  Problem Relation Age of Onset  . Heart disease Mother    . Heart disease Father    . Healthy Sister    . Healthy Brother      Social History:  reports that he has never smoked. He has never used smokeless tobacco. He reports current alcohol use. He reports that he does not use drugs. Allergies:       Allergies  Allergen Reactions  . Latex Itching      Skin turns real red          Medications Prior  to Admission  Medication Sig Dispense Refill  . Bioflavonoid Products (ESTER-C) 500-550 MG TABS Take 1 tablet by mouth daily.      . cholecalciferol (VITAMIN D3) 25 MCG (1000 UNIT) tablet Take 1,000 Units by mouth daily.      [name] diltiazem (CARDIZEM CD) 120 MG 24 hr capsule Take 1 capsule (120 mg total) by mouth daily. 90 capsule 3  . EPINEPHrine 0.3 mg/0.3 mL IJ SOAJ injection Inject 0.3 mg into the muscle once as needed for anaphylaxis.      [name] diltiazem (CARDIZEM) 30 MG tablet Take 1 tablet (30 mg total) by mouth daily  as needed (1-2 tablets every 4-6 hrs for fast heart rate.). (Patient not taking: No sig reported) 30 tablet 3      Home: Home Living Family/patient expects to be discharged to:: Private residence Living Arrangements: Spouse/significant other Additional Comments: Lethargy during questions  Functional History: Prior Function Level of Independence: Independent 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 Functional Status:  Mobility: Bed Mobility Overal bed mobility: Needs Assistance Bed Mobility: Supine to Sit,Sit to Supine Supine to sit: Mod assist,+2 for physical assistance,+2 for safety/equipment Sit to supine: Max assist,+2 for physical assistance,+2 for safety/equipment General bed mobility comments: modA to pull to sitting EOB, pt able to initiate movement with BLE, but modA at trunk to pull from [facility]. min/modA initially to maintain seated stability. maxA to return to supine with assist at trunk and BLE, line management Transfers Overall transfer level: Needs assistance Equipment used: 2 person hand held assist Transfers: Sit to/from Stand Sit to Stand: Mod assist,+2 physical assistance,+2 safety/equipment General transfer comment: modA through HHA with blocking of bilateral knees (more buckling RLE than LLE). cues for posture and upright position, but pt with good power in BLE to rise to standing. Ambulation/Gait Ambulation/Gait assistance: Max assist,+2 physical assistance,+2 safety/equipment Gait Distance (Feet): 3 Feet Assistive device: 2 person hand held assist Gait Pattern/deviations: Step-to pattern,Decreased stride length,Decreased step length - right,Decreased stance time - right,Decreased weight shift to right,Leaning posteriorly General Gait Details: BUE support through HHA of 2, modA to static stance, maxA for blocking of bilateral knees, assist to advance RLE, simple, direct cues for all movements in sequence Gait velocity: decreased Gait velocity interpretation: <1.31 ft/sec,  indicative of household ambulator   ADL: ADL Overall ADL's : Needs assistance/impaired Eating/Feeding: NPO Grooming: Wash/dry face,Minimal assistance,Sitting Grooming Details (indicate cue type and reason): Pt washing his face while sitting at EOB with use of LUE. Pt with poor sitting balance during this tasks and freuqneitn switching positions requiring Min A for postural control Upper Body Bathing: Moderate assistance,Sitting Lower Body Bathing: Moderate assistance,Bed level,Sit to/from stand Upper Body Dressing : Moderate assistance,Sitting Lower Body Dressing: Maximal assistance,Sit to/from stand Toilet Transfer: Moderate assistance,+2 for physical assistance Toilet Transfer Details (indicate cue type and reason): simulated at EOB. Mod A +2 for sit<>stand and R knee blocked. Functional mobility during ADLs: Moderate assistance,+2 for physical assistance,+2 for safety/equipment,Maximal assistance (sit<>stand and then side steps towards [facility]) General ADL Comments: Pt very motivated. Lethargic and keeping eye closed. Following simple, direct cues   Cognition: Cognition Overall Cognitive Status: Difficult to assess Orientation Level: Oriented to person Cognition Arousal/Alertness: Lethargic Behavior During Therapy: Flat affect Overall Cognitive Status: Difficult to assess General Comments: Pt able to answer simple questions regarding career and visitors, able to follow simple (one step direct) commands with good consistency at this time. Pt requires repeated cues to maintain eyes open through session, but remained participatory even with moments  of eyes closed Difficult to assess due to: Level of arousal   Blood pressure (!) 141/61, pulse (!) 51, temperature 97.6 F (36.4 C), temperature source Oral, resp. rate 19, height 6\' 2"  (1.88 m), weight 100 kg, SpO2 93 %. Physical Exam Vitals reviewed.  Constitutional:      General: He is in acute distress.     Appearance: Normal appearance.   HENT:     Head: Normocephalic and atraumatic.     Comments: + NG    Right Ear: External ear normal.     Left Ear: External ear normal.     Nose: Nose normal.  Eyes:     General:        Right eye: No discharge.        Left eye: No discharge.     Comments: Keeps eyes closed  Cardiovascular:     Comments: Irregularly irregular. Pulmonary:     Breath sounds: No stridor.     Comments: +  Increased work of breathing Abdominal:     General: There is distension.     Palpations: Abdomen is soft.     Comments: Hypoactive bowel sounds  Musculoskeletal:     Cervical back: Normal range of motion and neck supple.     Comments: No edema or tenderness in extremities  Skin:    General: Skin is warm and dry.  Neurological:     Mental Status: He is alert.     Comments: Lethargic Motor: Limited due to participation, appears to be 4/5 throughout Dysarthria noted Provides his name and place.   Follows simple commands.  Psychiatric:     Comments: Limited due to mentation and distress.        Lab Results Last 24 Hours  Results for orders placed or performed during the hospital encounter of 09/25/20 (from the past 24 hour(s))  Glucose, capillary     Status: Abnormal    Collection Time: 09/28/20  8:08 AM  Result Value Ref Range    Glucose-Capillary 158 (H) 70 - 99 mg/dL  CBC     Status: Abnormal    Collection Time: 09/28/20 11:06 AM  Result Value Ref Range    WBC 11.8 (H) 4.0 - 10.5 K/uL    RBC 4.03 (L) 4.22 - 5.81 MIL/uL    Hemoglobin 12.7 (L) 13.0 - 17.0 g/dL    HCT 09/30/20 98.9 - 21.1 %    MCV 99.5 80.0 - 100.0 fL    MCH 31.5 26.0 - 34.0 pg    MCHC 31.7 30.0 - 36.0 g/dL    RDW 94.1 74.0 - 81.4 %    Platelets PLATELET CLUMPS NOTED ON SMEAR, UNABLE TO ESTIMATE 150 - 400 K/uL    nRBC 0.0 0.0 - 0.2 %  Basic metabolic panel     Status: Abnormal    Collection Time: 09/28/20 11:06 AM  Result Value Ref Range    Sodium 152 (H) 135 - 145 mmol/L    Potassium 3.7 3.5 - 5.1 mmol/L     Chloride 122 (H) 98 - 111 mmol/L    CO2 27 22 - 32 mmol/L    Glucose, Bld 135 (H) 70 - 99 mg/dL    BUN 11 8 - 23 mg/dL    Creatinine, Ser 09/30/20 0.61 - 1.24 mg/dL    Calcium 8.8 (L) 8.9 - 10.3 mg/dL    GFR, Estimated 8.56 >31 mL/min    Anion gap 3 (L) 5 - 15  Glucose, capillary     Status:  Abnormal    Collection Time: 09/28/20 11:53 AM  Result Value Ref Range    Glucose-Capillary 144 (H) 70 - 99 mg/dL  Glucose, capillary     Status: Abnormal    Collection Time: 09/28/20  4:15 PM  Result Value Ref Range    Glucose-Capillary 164 (H) 70 - 99 mg/dL  Sodium     Status: Abnormal    Collection Time: 09/28/20  4:59 PM  Result Value Ref Range    Sodium 151 (H) 135 - 145 mmol/L  Glucose, capillary     Status: Abnormal    Collection Time: 09/28/20  7:39 PM  Result Value Ref Range    Glucose-Capillary 123 (H) 70 - 99 mg/dL  Sodium     Status: Abnormal    Collection Time: 09/28/20 10:51 PM  Result Value Ref Range    Sodium 151 (H) 135 - 145 mmol/L  Glucose, capillary     Status: Abnormal    Collection Time: 09/28/20 11:16 PM  Result Value Ref Range    Glucose-Capillary 133 (H) 70 - 99 mg/dL       Imaging Results (Last 48 hours)  CT HEAD WO CONTRAST   Result Date: 09/29/2020 CLINICAL DATA:  Follow-up intracranial hemorrhage EXAM: CT HEAD WITHOUT CONTRAST TECHNIQUE: Contiguous axial images were obtained from the base of the skull through the vertex without intravenous contrast. COMPARISON:  09/26/2020 FINDINGS: Brain: New right frontal external ventricular drain which traverses the body of the right lateral ventricle. Increase in intraventricular blood at the level of the third and lateral ventricles. Hemorrhage encompasses the lower catheter at the level of the parenchyma and also encompasses the intraventricular portion of the catheter. Small volume subarachnoid hemorrhage at the right vertex. The parenchymal hemorrhage in the left posterior midbrain, just left of the cerebral aqua duct, is  unchanged. Unchanged ventriculomegaly. Small cortically based infarcts have become apparent along the superior left frontal convexity. Vascular: No hyperdense vessel or unexpected calcification. Skull: Normal. Negative for fracture or focal lesion. Sinuses/Orbits: No acute finding. IMPRESSION: 1. New intraventricular hemorrhage in the third and lateral ventricles. Fourth ventricular and midbrain hemorrhage is unchanged. 2. New right frontal EVD which traverses the right lateral ventricle. Blood clot encompasses the lower catheter and ventriculomegaly is similar to prior. 3. Small cortical infarcts are newly seen along the superior left frontal convexity. Electronically Signed   By: Marnee SpringJonathon  Watts M.D.   On: 09/29/2020 04:28       Assessment/Plan: Diagnosis: Acute intraparenchymal hemorrhage in dorsal left pons with subarachnoid extension into the cerebral aqueduct and fourth ventricle Stroke: Continue secondary stroke prophylaxis and Risk Factor Modification listed below:   Blood Pressure Management:  Continue current medication with prn's with permisive HTN per primary team Prediabetes management:   ?hemiparesis: fit for orthosis to prevent contractures (resting hand splint for day, wrist cock up splint at night, PRAFO, etc) PT/OT for mobility, ADL training  Motor recovery: Fluoxetine Labs and images (see above) independently reviewed.  Records reviewed and summated above.   1. Does the need for close, 24 hr/day medical supervision in concert with the patient's rehab needs make it unreasonable for this patient to be served in a less intensive setting? Yes  2. Co-Morbidities requiring supervision/potential complications: atrial fibrillation (anticoagulation on hold due to brain hemorrhage, monitor with increased activity), prediabetes (Monitor in accordance with exercise and adjust meds as necessary), leukocytosis (repeat labs, cont to monitor for signs and symptoms of infection, further workup if  indicated), ABLA (repeat labs, consider  transfusion if necessary to ensure appropriate perfusion for increased activity tolerance), hyponatremia (wean 3% hypertonic saline when appropriate) 3. Due to bladder management, bowel management, safety, skin/wound care, disease management, medication administration, pain management and patient education, does the patient require 24 hr/day rehab nursing? Yes 4. Does the patient require coordinated care of a physician, rehab nurse, therapy disciplines of PT/OT/SLP to address physical and functional deficits in the context of the above medical diagnosis(es)? Yes Addressing deficits in the following areas: balance, endurance, locomotion, strength, transferring, bowel/bladder control, bathing, dressing, feeding, grooming, toileting, cognition, speech, swallowing and psychosocial support 5. Can the patient actively participate in an intensive therapy program of at least 3 hrs of therapy per day at least 5 days per week? Potentially 6. The potential for patient to make measurable gains while on inpatient rehab is excellent 7. Anticipated functional outcomes upon discharge from inpatient rehab are min assist  with PT, min assist with OT, modified independent and supervision with SLP. 8. Estimated rehab length of stay to reach the above functional goals is: 18-22 days. 9. Anticipated discharge destination: Home 10. Overall Rehab/Functional Prognosis: good   RECOMMENDATIONS: This patient's condition is appropriate for continued rehabilitative care in the following setting: CIR medically when stable and able tolerate 3 hours of therapy per day Patient has agreed to participate in recommended program. Potentially Note that insurance prior authorization may be required for reimbursement for recommended care.   Comment: Rehab Admissions Coordinator to follow up.   I have personally performed a face to face diagnostic evaluation, including, but not limited to relevant  history and physical exam findings, of this patient and developed relevant assessment and plan.  Additionally, I have reviewed and concur with the physician assistant's documentation above.    Maryla Morrow, MD, ABPMR Mcarthur Rossetti Angiulli, PA-C 09/29/2020

## 2020-10-30 NOTE — Progress Notes (Signed)
Physical Therapy Session Note  Patient Details  Name: Walter [name] MRN: [id number] Date of Birth: 07-23-1955  Today's Date: 10/30/2020 PT Individual Time: 0806-0900 PT Individual Time Calculation (min): 54 min   Short Term Goals: Week 1:  PT Short Term Goal 1 (Week 1): Pt will consistently perform supine<>sit with mod assist PT Short Term Goal 2 (Week 1): Pt will perform sit<>stands using LRAD with +2 mod assist PT Short Term Goal 3 (Week 1): Pt will perform bed<>chair transfers using LRAD with +2 mod assist PT Short Term Goal 4 (Week 1): Pt will tolerate sitting upright, OOB in TIS w/c for at least 2 hours outside of therapy sessions PT Short Term Goal 5 (Week 1): Pt will ambulate at least 76ft using LRAD with +2 mod assist  Skilled Therapeutic Interventions/Progress Updates:  Patient supine in bed on entrance to room with RN and NT completing morning vitals and med pass. Pt is fatigued but alert and agreeable to PT session. Patient denied pain throughout session.  Therapeutic Activity: Bed Mobility: Patient performed supine --> sit with supervision for bringing BLE to EOB and light Min A for completing bringing UB to upright seated position. VC/ tc required for technique to push with LUE from bed surface. At end of session, pt is able to return to supine with Min A for BLE to bed surface. With vc/tc for hooklying positioning, pt is able to use BLE to push self toward [facility] with Min A.  Transfers: Patient performed several STS transfers this day using STEDY with +2 assist. Bed surface elevated with pt able to rise to stand with Min A +2 and vc/tc for foot placement on platform and hand placement on bar. Requires Min/ mod A to control descent to toilet and Min A to return to stand. Performed several times for cleaning and dressing.  While seated on toilet, pt demos melt toward L side requiring vc/ tc for returning to upright seated position. Attempts pericare while seated on toilet and is  unable to complete. Requires MaxA for pericare and CGA/ Min A for maintaining upright posture in stance.   Dressing completed while seated at toilet and pt requires max A for finding armholes in t-shirt R>L, but is able to pull over head using L hand with Min A. Some impulsivity noted with UB dressing.  Patient supine in bed at end of session with brakes locked, bed alarm set, and all needs within reach.  Pt on 5L O2 throughout session and maintains SpO2 88-97%.    Therapy Documentation Precautions:  Precautions Precautions: Fall,Other (comment) Precaution Comments: NG tube, trach, PMSV, R hemiparesis, hx of DVTs and PEs Restrictions Weight Bearing Restrictions: No Vital Signs: Therapy Vitals Temp: 99.1 F (37.3 C) Pulse Rate: 87 Resp: 19 BP: 133/70 Patient Position (if appropriate): Lying Oxygen Therapy SpO2: 100 % O2 Device: Room Air  Therapy/Group: Individual Therapy  Loel Dubonnet 10/30/2020, 4:29 PM

## 2020-10-30 NOTE — Plan of Care (Signed)
  Problem: Consults Goal: RH STROKE PATIENT EDUCATION Description: See Patient Education module for education specifics  Outcome: Progressing   Problem: RH BOWEL ELIMINATION Goal: RH STG MANAGE BOWEL WITH ASSISTANCE Description: STG Manage Bowel with  mod I Assistance. Outcome: Progressing   Problem: RH BLADDER ELIMINATION Goal: RH STG MANAGE BLADDER WITH ASSISTANCE Description: STG Manage Bladder With  mod I Assistance Outcome: Progressing   Problem: RH SKIN INTEGRITY Goal: RH STG SKIN FREE OF INFECTION/BREAKDOWN Description: Min assist Outcome: Progressing Goal: RH STG MAINTAIN SKIN INTEGRITY WITH ASSISTANCE Description: STG Maintain Skin Integrity With min  Assistance. Outcome: Progressing Goal: RH STG ABLE TO PERFORM INCISION/WOUND CARE W/ASSISTANCE Description: STG Able To Perform Incision/Wound Care With min Assistance. Outcome: Progressing   Problem: RH SAFETY Goal: RH STG ADHERE TO SAFETY PRECAUTIONS W/ASSISTANCE/DEVICE Description: STG Adhere to Safety Precautions With cues/reminders Assistance/Device. Outcome: Progressing   Problem: RH KNOWLEDGE DEFICIT Goal: RH STG INCREASE KNOWLEDGE OF DIABETES Description: Patient and wife will be able to manage DM with medications and dietary modifications using handouts and educational tools independently Outcome: Progressing Goal: RH STG INCREASE KNOWLEDGE OF HYPERTENSION Description: Patient and wife will be able to manage HTN with medications and dietary modifications using handouts and educational tools independently Outcome: Progressing Goal: RH STG INCREASE KNOWLEDGE OF DYSPHAGIA/FLUID INTAKE Description: Patient and wife will be able to manage Dysphagia, medications and dietary modifications using handouts and educational tools independently Outcome: Progressing Goal: RH STG INCREASE KNOWLEGDE OF HYPERLIPIDEMIA Description: Patient and wife will be able to manage HLD with medications and dietary modifications using  handouts and educational tools independently Outcome: Progressing Goal: RH STG INCREASE KNOWLEDGE OF STROKE PROPHYLAXIS Description: Patient and wife will be able to manage Secondary stroke risks with medications and dietary modifications using handouts and educational tools independently Outcome: Progressing

## 2020-10-30 NOTE — Progress Notes (Signed)
Initial Nutrition Assessment  DOCUMENTATION CODES:   Non-severe (moderate) malnutrition in context of acute illness/injury  INTERVENTION:   Continue nocturnal tube feeds via Cortrak tube: - Change to Osmolite 1.5 @ 80 ml/hr x 12 hours from 1800 to 0600 - ProSource TF 45 ml TID - Free water flushes of 100 ml q 6 hours  Tube feeding regimen provides 1560 kcal, 93 grams of protein, and 732 ml of H2O (meets 68% of kcal needs and 81% of protein needs).  Total free water with flushes: 1132 ml  - Continue Ensure Enlive po BID, each supplement provides 350 kcal and 20 grams of protein  - Magic Cup BID with meals, each supplement provides 290 kcal and 9 grams of protein  NUTRITION DIAGNOSIS:   Moderate Malnutrition related to acute illness (intraparencymal hemorrhage) as evidenced by mild fat depletion,moderate muscle depletion,percent weight loss (7.6% weight loss in less than 3 months).  GOAL:   Patient will meet greater than or equal to 90% of their needs  MONITOR:   PO intake,Supplement acceptance,TF tolerance,Weight trends  REASON FOR ASSESSMENT:   Consult Enteral/tube feeding initiation and management  ASSESSMENT:   65 year old male with PMH of atrial fibrillation with ablation. Presented 09/17/20 with right-sided weakness, headache, dysarthria, and vomiting. Cranial CT/MRI showed acute intraparenchymal hemorrhage. Pt required emergent intubation for airway protection. EVD was placed per neurosurgery. Pt underwent tracheostomy 10/04/20. Pt initially NPO and is now on a dysphagia 3 diet with thin liquids. Cortrak tube in place for nutrition support.   Discussed pt with RN and NT. Per NT, pt consumed about 50% of lunch meal today. Per RN, pt's daughter wants to bring in chicken nuggets for pt to consumed. Discussed with RN that chicken nuggets would need to be chopped to meet criteria of dysphagia 3 diet.  Spoke with pt at bedside. Pt with no complaints, appreciative of RD's  visit. Pt unable to provide any diet or weight history at this time. No family present.  Reviewed weight history in chart. Pt with progressive weight loss since 08/14/20. Overall, pt has lost 7.7 kg since that date. This is a 7.6% weight loss in less than 3 months which is significant for timeframe. Pt meets criteria for malnutrition.  Meal Completion: 50% x 1 document meal (dinner on 5/01)  Medications reviewed and include: Ensure Enlive BID, SSI q 4 hours, lantus 6 units daily  Labs reviewed: sodium 133, hemoglobin 9.0 CBG's: 116-132 x 24 hours  NUTRITION - FOCUSED PHYSICAL EXAM:  Flowsheet Row Most Recent Value  Orbital Region No depletion  Upper Arm Region No depletion  Thoracic and Lumbar Region Mild depletion  Buccal Region Mild depletion  Temple Region No depletion  Clavicle Bone Region Mild depletion  Clavicle and Acromion Bone Region Moderate depletion  Scapular Bone Region No depletion  Dorsal Hand Moderate depletion  Patellar Region No depletion  Anterior Thigh Region Mild depletion  Posterior Calf Region No depletion  Edema (RD Assessment) None  Hair Reviewed  Eyes Reviewed  Mouth Reviewed  Skin Reviewed  Nails Reviewed       Diet Order:   Diet Order            DIET DYS 3 Room service appropriate? Yes; Fluid consistency: Thin  Diet effective now                 EDUCATION NEEDS:   No education needs have been identified at this time  Skin:  Skin Assessment: Skin Integrity Issues: Stage I: left  head Incisions: neck Other: MASD rectum, puncture to left groin  Last BM:  10/30/20 type 4  Height:   Ht Readings from Last 1 Encounters:  09/25/20 6\' 2"  (1.88 m)    Weight:   Wt Readings from Last 1 Encounters:  10/30/20 93.8 kg    BMI:  Body mass index is 26.55 kg/m.  Estimated Nutritional Needs:   Kcal:  2300-2500  Protein:  115-130 grams  Fluid:  > 2.0 L    12/30/20, MS, RD, LDN Inpatient Clinical Dietitian Please see AMiON for  contact information.

## 2020-10-30 NOTE — IPOC Note (Signed)
Overall Plan of Care [facility]) Patient Details Name: Walter [name] MRN: [id number] DOB: August 11, 1955  Admitting Diagnosis: ICH (intracerebral hemorrhage) [facility])  Hospital Problems: Principal Problem:   ICH (intracerebral hemorrhage) (HCC)     Functional Problem List: Nursing Bladder,Bowel,Nutrition,Medication Management,Endurance,Skin Integrity,Safety  PT Balance,Perception,Behavior,Safety,Edema,Sensory,Motor,Endurance,Skin Integrity,Nutrition,Pain  OT Balance,Behavior,Cognition,Endurance,Nutrition,Motor,Perception,Safety,Skin Integrity,Vision  SLP    TR         Basic ADL's: OT Eating,Grooming,Bathing,Dressing,Toileting     Advanced  ADL's: OT Simple Meal Preparation     Transfers: PT Bed Mobility,Bed to Chair,Car,Furniture  OT Toilet,Tub/Shower     Locomotion: PT Ambulation,Wheelchair Mobility     Additional Impairments: OT Fuctional Use of Upper Extremity  SLP        TR      Anticipated Outcomes Item Anticipated Outcome  Self Feeding Supervision/cuing  Swallowing      Basic self-care  Supervision-Min A  Toileting  Min A   Bathroom Transfers Supervision-CGA  Bowel/Bladder  manage bowel and bladder with mod I assist  Transfers  CGA  Locomotion  CGA using LRAD  Communication     Cognition     Pain     Safety/Judgment  Maintain safety with cues/reminders   Therapy Plan: PT Intensity: Minimum of 1-2 x/day ,45 to 90 minutes PT Frequency: 5 out of 7 days PT Duration Estimated Length of Stay: ~4 weeks OT Intensity: Minimum of 1-2 x/day, 45 to 90 minutes OT Frequency: 5 out of 7 days OT Duration/Estimated Length of Stay: 3-4 weeks     Due to the current state of emergency, patients may not be receiving their 3-hours of Medicare-mandated therapy.   Team Interventions: Nursing Interventions Patient/Family Education,Bladder Management,Bowel Management,Skin Care/Wound Management,Medication Chartered loss adjuster  PT interventions Community reintegration,Ambulation/gait training,DME/adaptive equipment instruction,Neuromuscular re-education,Psychosocial support,Stair training,UE/LE Strength taining/ROM,Wheelchair propulsion/positioning,Balance/vestibular training,Discharge planning,Functional electrical stimulation,Pain management,Skin care/wound management,Therapeutic Activities,UE/LE Coordination activities,Cognitive remediation/compensation,Disease management/prevention,Functional mobility training,Patient/family education,Splinting/orthotics,Therapeutic Exercise,Visual/perceptual remediation/compensation  OT Interventions Balance/vestibular training,DME/adaptive equipment instruction,Patient/family education,Therapeutic Activities,Cognitive remediation/compensation,Wheelchair propulsion/positioning,Psychosocial support,Therapeutic Exercise,UE/LE Strength taining/ROM,Self Care/advanced ADL retraining,Functional mobility training,Community reintegration,Discharge planning,Neuromuscular re-education,UE/LE Coordination activities,Visual/perceptual remediation/compensation,Splinting/orthotics,Pain management,Disease mangement/prevention  SLP Interventions    TR Interventions    SW/CM Interventions Discharge Planning,Psychosocial Support,Patient/Family Education,Disease Management/Prevention   Barriers to Discharge MD  Medical stability, Trach and Nutritional means  Nursing Decreased caregiver support,Home environment access/layout,Incontinence,Wound Care    PT Home environment access/layout,New oxygen,Trach    OT Home environment access/layout,Trach,Nutrition means,New oxygen    SLP      SW Incontinence,Trach,Other (comments),Nutrition means,Wound Care (Condom Cath) Trach, Incontinence, Cortrak,   Team Discharge Planning: Destination: PT-Home ,OT- Home , SLP-  Projected Follow-up: PT-Outpatient PT,24 hour supervision/assistance, OT-  Home health OT, SLP-  Projected Equipment Needs: PT-To be  determined, OT- To be determined, SLP-  Equipment Details: PT- , OT-  Patient/family involved in discharge planning: PT- Patient,  OT-Patient, SLP-   MD ELOS: 18-21d Medical Rehab Prognosis:  Good Assessment:  65 year old right-handed male history of atrial fibrillation with ablation not on anticoagulation followed by Dr. Johney Frame.  Patient lives with spouse independent prior to admission working as a Veterinary surgeon.  Presented 09/17/2020 with right side weakness headache and dysarthria as well as vomiting.    Cranial CT/MRI showed acute intraparenchymal hemorrhage in dorsal left pons with subarachnoid extension into the cerebral aqueduct and fourth ventricle.  CT angiogram head and neck no vascular malformation, anomaly or aneurysm.  Echocardiogram with ejection fraction of 55 to 60% grade 1 diastolic dysfunction no regional wall motion abnormalities.  Admission chemistries unremarkable aside glucose 169 alcohol negative urine  drug screen negative.  Patient did require emergent intubation for airway protection.  Hospital course complicated by hydrocephalus with diagnostic cerebral angiogram completed showing no evidence of aneurysm.  EVD was placed per neurosurgery.  Maintained on 3% hypertonic saline.  Patient was extubated 09/28/2020.  Follow-up cardiology services for atrial fibrillation maintained on Cardizem.  Patient was cleared to begin Lovenox for DVT prophylaxis 09/28/2020.  Repeat head CT 09/29/2020 showed new intraventricular hemorrhage in the third and lateral ventricles.  Fourth ventricular and midbrain hemorrhage unchanged and again repeated 10/24/2020 showing improving hemorrhage and edema along the shunt tract in the right frontal lobe.  No new area of hemorrhage..  Patient with long-term ventilatory support undergone tracheostomy 10/04/2020 per Dr.Chand critical care services.  On 10/08/2020 patient found to have bilateral lower extremity DVTs and underwent placement of IVC filter placement per interventional  radiology.  Dopplers again completed 10/25/2020 showing acute DVT right common femoral vein and SF junction right femoral vein and right proximal profunda vein right popliteal vein and right posterior tibial vein right peroneal vein right soleal vein and right gastrocnemius vein it was established the patient had an IVC filter maintained and Eliquis was also initiated.  Patient with fever of unknown origin infectious disease consulted broad-spectrum antibiotics  initiated.  SARS coronavirus negative.  CT chest abdomen pelvis did show bilateral large volume right greater than left pulmonary emboli no evidence of right heart strain and it was discussed to continue IVC filter as well as Eliquis.  Interventional radiology consulted to discuss possible removal of IVC filter with CT venogram 10/27/2020 showed thrombus involving the IVC and iliac veins.  Large amount of thrombus involving the infrarenal IVC and IVC filter.  Suprarenal IVC was patent.  Thrombus in the right iliac veins and thrombus in the proximal femoral veins bilaterally and plan was to undergo thrombectomy and retrieval of IVC filter 10/27/2020 per Dr. Deanne Coffer of interventional radiology..  Patient initially n.p.o. with alternative means of nutritional support advanced to dysphagia #3 thin liquids and also discussion of possible need for PEG tube.  Therapy evaluations were completed due to patient decreased functional mobility was admitted for a comprehensive rehab program. Very eager to go home. Wife at bedside.   Now requiring 24/7 Rehab RN,MD, as well as CIR level PT, OT and SLP.  Treatment team will focus on ADLs and mobility with goals set at MinA/Sup  See Team Conference Notes for weekly updates to the plan of care

## 2020-10-30 NOTE — Progress Notes (Signed)
Patient ID: Walter [name], male   DOB: 1956-05-03, 65 y.o.   MRN: [id number] Met with the patient and wife to introduce self and the role of the nurse CM and initiate education. Wife notes "healthy" except for A-fib PTA and taking cardiazem but no idea of stroke risk, DM, HLD (LDL 88) ,etc. Reviewed DM, A1C 5.7 and Lantus. Handouts given on DASH, Cooking with less salt, Diabetes management, insulin pen use, etc. Wife to review materials and once on a regular consistency diet, address low albumin level. Continue to follow along to discharge to address educational needs for risk factors. Margarito Liner

## 2020-10-30 NOTE — Evaluation (Signed)
Speech Language Pathology Assessment and Plan  Patient Details  Name: Walter [name] MRN: [id number] Date of Birth: 08-Feb-1956  SLP Diagnosis: Cognitive Impairments;Voice disorder;Dysphagia  Rehab Potential: Excellent ELOS: 4 weeks    Today's Date: 10/30/2020 SLP Individual Time: 1030-1130 SLP Individual Time Calculation (min): 60 min   Hospital Problem: Principal Problem:   ICH (intracerebral hemorrhage) ([place])  Past Medical History:  Past Medical History:  Diagnosis Date  . Atrial flutter ([place])   . Dizzy 09/25/13  . Near syncope 09/25/13   Past Surgical History:  Past Surgical History:  Procedure Laterality Date  . A-FLUTTER ABLATION N/A 03/09/2020   Procedure: A-FLUTTER ABLATION;  Surgeon: Thompson Grayer, MD;  Location: [place] [facility];  Service: Cardiovascular;  Laterality: N/A;  . CARDIOVERSION N/A 09/27/2013   Procedure: CARDIOVERSION;  Surgeon: Lelon Perla, MD;  Location: [facility] ENDOSCOPY;  Service: Cardiovascular;  Laterality: N/A;  . IR ANGIO EXTERNAL CAROTID SEL EXT CAROTID UNI R MOD SED  09/26/2020  . IR ANGIO INTRA EXTRACRAN SEL COM CAROTID INNOMINATE BILAT MOD SED  09/26/2020  . IR ANGIO VERTEBRAL SEL VERTEBRAL UNI R MOD SED  09/26/2020  . IR IVC FILTER PLMT / S&I /IMG GUID/MOD SED  10/08/2020  . IR THROMBECT VENO MECH MOD SED  10/27/2020  . IR US GUIDE VASC ACCESS LEFT  10/27/2020  . IR US GUIDE VASC ACCESS RIGHT  09/26/2020  . IR VENOCAVAGRAM IVC  10/27/2020  . RETINAL DETACHMENT SURGERY    . right knee arthroscopy    . TEE WITHOUT CARDIOVERSION N/A 09/27/2013   Procedure: TRANSESOPHAGEAL ECHOCARDIOGRAM (TEE);  Surgeon: Lelon Perla, MD;  Location: [facility] ENDOSCOPY;  Service: Cardiovascular;  Laterality: N/A;  . WRIST SURGERY      Assessment / Plan / Recommendation Clinical Impression Patient is a 65 year old right-handed male history of atrial fibrillation with ablation not on anticoagulation followed by Dr. Rayann Heman.  Patient lives with spouse independent prior to  admission working as a [employer].  Presented 09/17/2020 with right side weakness headache and dysarthria as well as vomiting.    Cranial CT/MRI showed acute intraparenchymal hemorrhage in dorsal left pons with subarachnoid extension into the cerebral aqueduct and fourth ventricle.  Patient did require emergent intubation for airway protection.  Hospital course complicated by hydrocephalus with diagnostic cerebral angiogram completed showing no evidence of aneurysm.  EVD was placed per neurosurgery.  Patient was extubated 09/28/2020.   Repeat head CT 09/29/2020 showed new intraventricular hemorrhage in the third and lateral ventricles.  Fourth ventricular and midbrain hemorrhage unchanged and again repeated 10/24/2020 showing improving hemorrhage and edema along the shunt tract in the right frontal lobe.  No new area of hemorrhage. Patient with long-term ventilatory support undergone tracheostomy 10/04/2020 per Dr.Chand critical care services.  On 10/08/2020 patient found to have bilateral lower extremity DVTs and underwent placement of IVC filter placement per interventional radiology.  Dopplers again completed 10/25/2020 showing acute DVT right common femoral vein and SF junction right femoral vein and right proximal profunda vein right popliteal vein and right posterior tibial vein right peroneal vein right soleal vein and right gastrocnemius vein it was established the patient had an IVC filter maintained and Eliquis was also initiated.  CT chest abdomen pelvis did show bilateral large volume right greater than left pulmonary emboli no evidence of right heart strain and it was discussed to continue IVC filter as well as Eliquis.  Interventional radiology consulted to discuss possible removal of IVC filter with CT venogram 10/27/2020 showed thrombus  involving the IVC and iliac veins.  Large amount of thrombus involving the infrarenal IVC and IVC filter.  Suprarenal IVC was patent.  Thrombus in the right iliac veins and  thrombus in the proximal femoral veins bilaterally and plan was to undergo thrombectomy and retrieval of IVC filter 10/27/2020 per Dr. Vernard Gambles of interventional radiology..  Patient initially n.p.o. with alternative means of nutritional support advanced to dysphagia #3 thin liquids and also discussion of possible need for PEG tube.  Therapy evaluations were completed due to patient decreased functional mobility was admitted for a comprehensive rehab program 10/29/63.  Patient currently has a #6 cuffless trach and was wearing his PMSV upon arrival to room. Patient's vitals stayed [facility] without evidence of air trapping but patient did require Max multimodal cues for use of increased breath support to improve speech intelligibility at the word and phrase level. Recommend patient wear the PMSV during all waking hours with full supervision. Patient appeared lethargic throughout evaluation and required overall Max A multimodal cues for sustained attention. Patient asked frequent but appropriate questions multiple times throughout the session indicative of decreased recall and awareness. Patient consumed large, sequential sips of thin liquids via straw without overt s/s of aspiration and demonstrated efficient metrication of solid textures with intermittent verbal cues needed for attention to bolus. Recommend patient continue current diet of Dys. 3 textures with thin liquids with full supervision. Patient would benefit from skilled SLP intervention to maximize his cognitive, speech and swallowing function prior to discharge.    Skilled Therapeutic Interventions          Administered a cognitive-linguistic evaluation, PMSV evaluation and BSE, please see above for details.    SLP Assessment  Patient will need skilled Speech Lanaguage Pathology Services during CIR admission    Recommendations  Patient may use Passy-Muir Speech Valve: Caregiver trained to provide supervision;During all waking hours (remove during  sleep);During PO intake/meals PMSV Supervision: Full MD: Please consider changing trach tube to : Smaller size SLP Diet Recommendations: Dysphagia 3 (Mech soft);Thin Liquid Administration via: Cup;Straw Medication Administration: Whole meds with puree Supervision: Staff to assist with self feeding;Patient able to self feed;Full supervision/cueing for compensatory strategies Compensations: Slow rate;Small sips/bites;Minimize environmental distractions Postural Changes and/or Swallow Maneuvers: Seated upright 90 degrees Oral Care Recommendations: Oral care BID Recommendations for Other Services: Neuropsych consult Patient destination: Home Follow up Recommendations: Home Health SLP;Outpatient SLP Equipment Recommended: To be determined    SLP Frequency 3 to 5 out of 7 days   SLP Duration  SLP Intensity  SLP Treatment/Interventions 4 weeks  Minumum of 1-2 x/day, 30 to 90 minutes  Cognitive remediation/compensation;Dysphagia/aspiration precaution training;Cueing hierarchy;Environmental controls;Functional tasks;Patient/family education;Therapeutic Activities;Internal/external aids;Speech/Language facilitation    Pain No/Denies Pain  SLP Evaluation Cognition Overall Cognitive Status: Impaired/Different from baseline Arousal/Alertness: Lethargic Orientation Level: Oriented to person;Oriented to place;Disoriented to time;Oriented to situation Attention: Focused;Sustained Focused Attention: Appears intact Sustained Attention: Impaired Sustained Attention Impairment: Verbal basic;Functional basic Selective Attention: Impaired Selective Attention Impairment: Verbal basic;Functional basic Memory: Impaired Memory Impairment: Decreased recall of new information;Decreased short term memory Awareness: Impaired Awareness Impairment: Intellectual impairment Problem Solving: Impaired Problem Solving Impairment: Functional basic Behaviors: Impulsive;Restless Safety/Judgment: Impaired   Comprehension Auditory Comprehension Overall Auditory Comprehension: Impaired Interfering Components: Attention;Visual impairments;Working Lobbyist Expression Overall Verbal Expression: Impaired Initiation: Impaired Automatic Speech: Name;Social Response Level of Generative/Spontaneous Verbalization: Word;Phrase Interfering Components: Attention Written Expression Dominant Hand: Right Oral Motor Oral Motor/Sensory Function Overall Oral Motor/Sensory Function: Mild impairment Facial ROM: Reduced right Facial  Symmetry: Abnormal symmetry right Facial Strength: Reduced right Lingual Symmetry: Within Functional Limits Lingual Strength: Reduced Motor Speech Overall Motor Speech: Impaired Respiration: Impaired Level of Impairment: Word Phonation: Low vocal intensity Resonance: Within functional limits Articulation: Within functional limitis Intelligibility: Intelligibility reduced Word: 50-74% accurate Phrase: 50-74% accurate Sentence: 50-74% accurate Conversation: 50-74% accurate Motor Planning: Witnin functional limits  Care Tool Care Tool Cognition Expression of Ideas and Wants Expression of Ideas and Wants: Frequent difficulty - frequently exhibits difficulty with expressing needs and ideas   Understanding Verbal and Non-Verbal Content Understanding Verbal and Non-Verbal Content: Usually understands - understands most conversations, but misses some part/intent of message. Requires cues at times to understand   Memory/Recall Ability *first 3 days only       PMSV Assessment  PMSV Trial PMSV was placed for: 60 minutes Able to redirect subglottic air through upper airway: Yes Able to Attain Phonation: Yes Voice Quality: Low vocal intensity Able to Expectorate Secretions: No attempts Breath Support for Phonation: Mildly decreased Intelligibility: Intelligibility reduced Word: 50-74% accurate Phrase: 50-74% accurate Sentence: 50-74%  accurate Conversation: 50-74% accurate SpO2 During Trial: 95 % Pulse During Trial: 81 Behavior: Responsive to questions;Cooperative;Controlled;Alert  Bedside Swallowing Assessment General Date of Onset: 09/17/20 Previous Swallow Assessment: MBS on 4/22: Recommended Dys. 1 textures with thin liquids but has since been upgrade to Dys. 3 textures Diet Prior to this Study: Dysphagia 3 (soft);Thin liquids Temperature Spikes Noted: No Respiratory Status: Trach Trach Size and Type: Cuff;#6;Deflated;With PMSV in place History of Recent Intubation: Yes Behavior/Cognition: Alert;Cooperative;Pleasant mood Oral Cavity - Dentition: Adequate natural dentition Self-Feeding Abilities: Needs assist Vision: Impaired for self-feeding Patient Positioning: Upright in bed Baseline Vocal Quality: Low vocal intensity Volitional Cough: Weak Volitional Swallow: Able to elicit  Ice Chips Ice chips: Not tested Thin Liquid Thin Liquid: Within functional limits Presentation: Straw Nectar Thick Nectar Thick Liquid: Not tested Honey Thick Honey Thick Liquid: Not tested Puree Puree: Within functional limits Presentation: Self Fed;Spoon Solid Solid: Impaired Oral Phase Impairments: Poor awareness of bolus BSE Assessment Risk for Aspiration Impact on safety and function: Moderate aspiration risk;Risk for inadequate nutrition/hydration Other Related Risk Factors: Lethargy;Cognitive impairment;Prolonged intubation;Deconditioning  Short Term Goals: Week 1: SLP Short Term Goal 1 (Week 1): Patient will utilize an increased vocal intensity at the phrase leve to achieve ~50% intelligibility with Mod verbal cues. SLP Short Term Goal 2 (Week 1): Patient will consume current diet with minimal overt s/s of aspiration with Mod verbal cues for use of swallowing compensatory strategies. SLP Short Term Goal 3 (Week 1): Patient will demonstrate sustained attention to functional tasks for 15 minutes with Mod verbal cues  for redirection. SLP Short Term Goal 4 (Week 1): Patient will utilize external aids and contextual cues for orientation with Mod multimodal cues. SLP Short Term Goal 5 (Week 1): Patient will demonstrate functional problem solving for basic and familiar tasks with Max A multimodal cues.  Refer to Care Plan for Long Term Goals  Recommendations for other services: Neuropsych  Discharge Criteria: Patient will be discharged from SLP if patient refuses treatment 3 consecutive times without medical reason, if treatment goals not met, if there is a change in medical status, if patient makes no progress towards goals or if patient is discharged from hospital.  The above assessment, treatment plan, treatment alternatives and goals were discussed and mutually agreed upon: by patient  Taedyn Glasscock 10/30/2020, 3:59 PM

## 2020-10-30 NOTE — Consult Note (Signed)
NAME:  Walter [name], MRN:  [id number], DOB:  February 09, 1956, LOS: 2 ADMISSION DATE:  10/28/2020, CONSULTATION DATE:  5/2 REFERRING MD:  Kisteins , CHIEF COMPLAINT:  Trach management    History of Present Illness:  This is a 65 year old male who was initially admitted to [facility] On 3/28 after presenting for a code stroke with initial symptoms of headache and right-sided weakness.  Admitted for intraparenchymal hemorrhage involving the left pons with subarachnoid extension.  Course complicated by ineffective airway clearance requiring intubation, cerebral edema, and intraventricular hemorrhage.  Progress in ICU was slow, course complicated by failed attempt at extubation requiring tracheostomy placement on 4/6.  Also Exidine of bacterial bacteremia, significant deconditioning, and bilateral DVT requiring IVC placement.  Ultimately liberated from mechanical ventilation, and on 4/22 moved out of the intensive care.  She was supported on the medical ward from the 22nd up until 4/29 at which time she was transferred to acute rehab.  Pulmonary asked to follow for tracheostomy management.  Pertinent  Medical History  Atrial fibrillation with prior ablation not on anticoagulation. Acute intraparenchymal hemorrhage involving the dorsal left pons with subarachnoid extension.  Complicated by decreased functional ability and right-sided hemiparesis with cognitive deficits Grade 1 diastolic dysfunction Lower ext DVT c/b obstructed/clotted IVC filter req mechanical thrombectomy and removal of IVC filter.  Prolonged mechanical ventilation with ineffective airway clearance requiring tracheostomy  Significant Hospital Events: Including procedures, antibiotic start and stop dates in addition to other pertinent events   . 3/28 admitted with intraparenchymal hemorrhage required intubation  3/29- EVD placement for developing hydrocephalus. CTA of the head was unremarkable  3/30- Extubated.  4/1  respiratory culture grew Moraxella, on ceftriaxone  4/4 reintubated because of hypoxia and unresponsiveness  4/6 tracheostomy  4/7 still encephalopathic   4/8 remains encephalopathic  4/10 Doppler shows bilateral DVT. IVC filter placed.   4/11 Blood cultures positive for Acinetobacter   4/12 Fever no worse off [place], off sedative infusions, tolerating trach collar.   4/13 Febrile @ 7am  4/14 - 4/17 4/16 Afebrile Intermittently following simple commands and participated in PT, drowsy today  4/18 febrile. Starting unasyn, sending trach asp  4/19 T Max 101.7, WBC 12.2, ALT elevated, profoundly deconditioned  4/20 awake and following commands. Trach aspirate w few GPC.   4/21 Tolerated trach collar >24h  4/22 D1 diet, transfer out of ICU 4/27 increased LE edema had Venogram showed =Positive for thrombus involving the IVC and iliac veins. Large amount of thrombus involving the infrarenal IVC and IVC filter.Suprarenal IVC is patent. Thrombus in the right iliac veins and thrombus in the proximal femoral veins bilaterally  4/29 Underwent mechanical thrombectomy,removal of IVC filter.  trach changed to cuffless.  to rehab   5/3 Pulm consulted for trach    Interim History / Subjective:  No distress.  Objective   Blood pressure 127/68, pulse 82, temperature 98.6 F (37 C), temperature source Oral, resp. rate 16, weight 93.8 kg, SpO2 97 %.    FiO2 (%):  [28 %] 28 %   Intake/Output Summary (Last 24 hours) at 10/30/2020 0934 Last data filed at 10/30/2020 0600 Gross per 24 hour  Intake 1188 ml  Output no documentation  Net 1188 ml   Filed Weights   10/29/20 0426 10/30/20 0500  Weight: 92.7 kg 93.8 kg    Examination: General: 65 yowm resting in bed. He is in no distress HENT: NCAT. Surgical site healed. Hair growing back. Phonation quality is soft but clear.  He currently has size 6 cuffless trach in place. Tolerating well. No breakdown around trach flange Lungs: clear no  accessory use. Currently on 28% ATC Cardiovascular: regular no MRG appreciated Abdomen: soft has FT Extremities: warm + LE edema  Neuro: awake, oriented. Moves all ext right side weaker  GU: voids  Labs/imaging that I havepersonally reviewed  (right click and "Reselect all SmartList Selections" daily)  See below  Resolved Hospital Problem list     Assessment & Plan:  Trach dependence secondary to ineffective airway clearance  S/p Intraparenchymal hemorrhage w/ right sided hemiplegia Bilateral DVT Deconditioning  Htn H/o afib H/o urinary retention Macrocytic anemia   Pulm problem list  Trach dependence   Discussion No distress, not needing sxn. Tolerating diet.I think eventually may be candidate for decannulation at some point. Currently I want to be sure his endurance has improved. Also there had been question about sleep apnea prior to all this as part of his eval for afib. It would be important to at least be sure he does NOT desaturate at night w/ capped trach so when we start capping trials we need to be sure he has cont Pulse ox and instruct staff the importance of documenting desaturation.   Plan For now cont ATC w/ PMV during day time hours Cont PT efforts Cont routine trach care I will place order for trach team to initiate education (although I am hopeful we may not need it) Will see him again on Thursday and follow along as he progresses.    Best practice (right click and "Reselect all SmartList Selections" daily)  Per primary   Labs   CBC: Recent Labs  Lab 10/25/20 0212 10/27/20 0144 10/28/20 0226 10/29/20 0706  WBC 8.3 7.2  --  6.1  NEUTROABS 5.3  --   --  3.8  HGB 9.0* 9.2* 8.3* 9.0*  HCT 28.7* 29.2* 25.6* 28.3*  MCV 102.1* 100.7*  --  100.0  PLT 181 199  --  227    Basic Metabolic Panel: Recent Labs  Lab 10/25/20 0212 10/27/20 0144 10/28/20 0226 10/28/20 1845 10/29/20 0706  NA 133* 132* 133* 134* 133*  K 4.5 4.1 4.0 4.0 4.0  CL 101 99  101 101 100  CO2 23 25 24 25 25   GLUCOSE 119* 98 137* 107* 122*  BUN 20 13 12 8  7*  CREATININE 0.72 0.65 0.68 0.68 0.68  CALCIUM 8.8* 8.7* 8.4* 8.6* 8.6*   GFR: Estimated Creatinine Clearance: 107 mL/min (by C-G formula based on SCr of 0.68 mg/dL). Recent Labs  Lab 10/25/20 0212 10/27/20 0144 10/29/20 0706  WBC 8.3 7.2 6.1    Liver Function Tests: Recent Labs  Lab 10/27/20 1053 10/29/20 0706  AST 39 27  ALT 57* 45*  ALKPHOS 95 84  BILITOT 0.8 0.9  PROT 5.7* 5.9*  ALBUMIN 2.9* 2.9*   No results for input(s): LIPASE, AMYLASE in the last 168 hours. No results for input(s): AMMONIA in the last 168 hours.  ABG    Component Value Date/Time   PHART 7.443 10/12/2020 1527   PCO2ART 38.9 10/12/2020 1527   PO2ART 92 10/12/2020 1527   HCO3 26.7 10/12/2020 1527   TCO2 28 10/12/2020 1527   ACIDBASEDEF 5.0 (H) 10/02/2020 0859   O2SAT 98.0 10/12/2020 1527     Coagulation Profile: Recent Labs  Lab 10/25/20 1041  INR 1.1    Cardiac Enzymes: No results for input(s): CKTOTAL, CKMB, CKMBINDEX, TROPONINI in the last 168 hours.  HbA1C: Hgb A1c MFr  Bld  Date/Time Value Ref Range Status  09/25/2020 03:09 AM 5.7 (H) 4.8 - 5.6 % Final    Comment:    (NOTE) Pre diabetes:          5.7%-6.4%  Diabetes:              >6.4%  Glycemic control for   <7.0% adults with diabetes   09/25/2013 06:10 PM 5.7 (H) <5.7 % Final    Comment:    (NOTE)                                                                       According to the ADA Clinical Practice Recommendations for 2011, when HbA1c is used as a screening test:  >=6.5%   Diagnostic of Diabetes Mellitus           (if abnormal result is confirmed) 5.7-6.4%   Increased risk of developing Diabetes Mellitus References:Diagnosis and Classification of Diabetes Mellitus,Diabetes Care,2011,34(Suppl 1):S62-S69 and Standards of Medical Care in         Diabetes - 2011,Diabetes Care,2011,34 (Suppl 1):S11-S61.    CBG: Recent Labs   Lab 10/29/20 1604 10/29/20 2009 10/29/20 2319 10/30/20 0358 10/30/20 0819  GLUCAP 131* 116* 127* 132* 116*    Review of Systems:   Review of Systems  Constitutional: Positive for malaise/fatigue.  HENT: Negative.   Eyes: Negative.   Respiratory: Negative.   Cardiovascular: Negative.   Gastrointestinal: Negative.   Genitourinary: Negative.   Musculoskeletal: Negative.   Skin: Negative.   Neurological: Positive for focal weakness.  Endo/Heme/Allergies: Negative.   \   Past Medical History:  He,  has a past medical history of Atrial flutter (HCC), Dizzy (09/25/13), and Near syncope (09/25/13).   Surgical History:   Past Surgical History:  Procedure Laterality Date  . A-FLUTTER ABLATION N/A 03/09/2020   Procedure: A-FLUTTER ABLATION;  Surgeon: Hillis Range, MD;  Location: [facility] INVASIVE CV LAB;  Service: Cardiovascular;  Laterality: N/A;  . CARDIOVERSION N/A 09/27/2013   Procedure: CARDIOVERSION;  Surgeon: Lewayne Bunting, MD;  Location: [facility] ENDOSCOPY;  Service: Cardiovascular;  Laterality: N/A;  . IR ANGIO EXTERNAL CAROTID SEL EXT CAROTID UNI R MOD SED  09/26/2020  . IR ANGIO INTRA EXTRACRAN SEL COM CAROTID INNOMINATE BILAT MOD SED  09/26/2020  . IR ANGIO VERTEBRAL SEL VERTEBRAL UNI R MOD SED  09/26/2020  . IR IVC FILTER PLMT / S&I /IMG GUID/MOD SED  10/08/2020  . IR THROMBECT VENO MECH MOD SED  10/27/2020  . IR US GUIDE VASC ACCESS LEFT  10/27/2020  . IR US GUIDE VASC ACCESS RIGHT  09/26/2020  . IR VENOCAVAGRAM IVC  10/27/2020  . RETINAL DETACHMENT SURGERY    . right knee arthroscopy    . TEE WITHOUT CARDIOVERSION N/A 09/27/2013   Procedure: TRANSESOPHAGEAL ECHOCARDIOGRAM (TEE);  Surgeon: Lewayne Bunting, MD;  Location[facility] ENDOSCOPY;  Service: Cardiovascular;  Laterality: N/A;  . WRIST SURGERY       Social History:   reports that he has never smoked. He has never used smokeless tobacco. He reports current alcohol use. He reports that he does not use drugs.   Family History:   His family history includes Healthy in his brother and sister; Heart disease in his father  and mother.   Allergies Allergies  Allergen Reactions  . Keppra [Levetiracetam] Swelling    Patient experienced angioedema post inpatient keppra dose  . Latex Itching    Skin turns real red     Home Medications  Prior to Admission medications   Medication Sig Start Date End Date Taking? Authorizing Provider  amantadine (SYMMETREL) 50 MG/5ML solution Place 20 mLs (200 mg total) into feeding tube 2 (two) times daily. 10/28/20   Mikhail, Nita Sells, DO  apixaban (ELIQUIS) 5 MG TABS tablet Place 1 tablet (5 mg total) into feeding tube 2 (two) times daily. 10/28/20   Edsel Petrin, DO  cholecalciferol (VITAMIN D3) 25 MCG (1000 UNIT) tablet Take 1,000 Units by mouth daily.    [provider]  diltiazem (CARDIZEM) 10 mg/ml oral suspension Place 3 mLs (30 mg total) into feeding tube every 6 (six) hours. 10/28/20   Mikhail, Nita Sells, DO  doxazosin (CARDURA) 2 MG tablet Place 1 tablet (2 mg total) into feeding tube daily. 10/28/20   Mikhail, Nita Sells, DO  EPINEPHrine 0.3 mg/0.3 mL IJ SOAJ injection Inject 0.3 mg into the muscle once as needed for anaphylaxis. 02/07/20   [provider]  feeding supplement (ENSURE ENLIVE / ENSURE PLUS) LIQD Take 237 mLs by mouth 2 (two) times daily between meals. 10/28/20   Mikhail, Nita Sells, DO  guaiFENesin-dextromethorphan (ROBITUSSIN DM) 100-10 MG/5ML syrup Place 5 mLs into feeding tube every 4 (four) hours as needed for cough. 10/28/20   Mikhail, Nita Sells, DO  insulin aspart (NOVOLOG) 100 UNIT/ML injection Inject 0-20 Units into the skin every 4 (four) hours. 10/28/20   Mikhail, Nita Sells, DO  insulin glargine (LANTUS) 100 UNIT/ML injection Inject 0.06 mLs (6 Units total) into the skin daily. 10/28/20   Mikhail, Nita Sells, DO  Nutritional Supplements (FEEDING SUPPLEMENT, PROSOURCE TF,) liquid Place 45 mLs into feeding tube 3 (three) times daily. 10/28/20   Mikhail, Nita Sells,  DO  Nutritional Supplements (FEEDING SUPPLEMENT, VITAL 1.5 CAL,) LIQD Place 960 mLs into feeding tube daily. 10/28/20   Mikhail, Nita Sells, DO  oxyCODONE (OXY IR/ROXICODONE) 5 MG immediate release tablet Place 1 tablet (5 mg total) into feeding tube every 6 (six) hours as needed (RASS goal 0). 10/28/20   Edsel Petrin, DO  QUEtiapine (SEROQUEL) 25 MG tablet Take 0.5 tablets (12.5 mg total) by mouth at bedtime. 10/28/20   Edsel Petrin, DO  Water For Irrigation, Sterile (FREE WATER) SOLN Place 200 mLs into feeding tube every 4 (four) hours. 10/28/20   Edsel Petrin, DO     Critical care time: NA      Simonne Martinet ACNP-BC [facility] Pulmonary/Critical Care Pager # [phone] OR # [phone] if no answer

## 2020-10-30 NOTE — Progress Notes (Signed)
Inpatient Rehabilitation Care Coordinator Assessment and Plan Patient Details  Name: Walter [name] MRN: [id number] Date of Birth: 06/08/1956  Today's Date: 10/30/2020  Hospital Problems: Principal Problem:   ICH (intracerebral hemorrhage) [facility])  Past Medical History:  Past Medical History:  Diagnosis Date  . Atrial flutter ([place])   . Dizzy 09/25/13  . Near syncope 09/25/13   Past Surgical History:  Past Surgical History:  Procedure Laterality Date  . A-FLUTTER ABLATION N/A 03/09/2020   Procedure: A-FLUTTER ABLATION;  Surgeon: Thompson Grayer, MD;  Location: [place] CV LAB;  Service: Cardiovascular;  Laterality: N/A;  . CARDIOVERSION N/A 09/27/2013   Procedure: CARDIOVERSION;  Surgeon: Lelon Perla, MD;  Location: [facility] ENDOSCOPY;  Service: Cardiovascular;  Laterality: N/A;  . IR ANGIO EXTERNAL CAROTID SEL EXT CAROTID UNI R MOD SED  09/26/2020  . IR ANGIO INTRA EXTRACRAN SEL COM CAROTID INNOMINATE BILAT MOD SED  09/26/2020  . IR ANGIO VERTEBRAL SEL VERTEBRAL UNI R MOD SED  09/26/2020  . IR IVC FILTER PLMT / S&I /IMG GUID/MOD SED  10/08/2020  . IR THROMBECT VENO MECH MOD SED  10/27/2020  . IR US GUIDE VASC ACCESS LEFT  10/27/2020  . IR US GUIDE VASC ACCESS RIGHT  09/26/2020  . IR VENOCAVAGRAM IVC  10/27/2020  . RETINAL DETACHMENT SURGERY    . right knee arthroscopy    . TEE WITHOUT CARDIOVERSION N/A 09/27/2013   Procedure: TRANSESOPHAGEAL ECHOCARDIOGRAM (TEE);  Surgeon: Lelon Perla, MD;  Location[facility] ENDOSCOPY;  Service: Cardiovascular;  Laterality: N/A;  . WRIST SURGERY     Social History:  reports that he has never smoked. He has never used smokeless tobacco. He reports current alcohol use. He reports that he does not use drugs.  Family / Support Systems Marital Status: Married Patient Roles: Spouse Spouse/Significant Other: Walter [name] Anticipated Caregiver: Walter [name] Ability/Limitations of Caregiver: NONE Caregiver Availability: 24/7  Social History Preferred language:  English Religion: Presbyterian Read: Yes Write: Yes Employment Status: Employed Name of Employer: Personal assistant Return to Work Plans: TBD Public relations account executive Issues: n/a Guardian/Conservator: Walter [name]   Abuse/Neglect Abuse/Neglect Assessment Can Be Completed: Unable to assess, patient is non-responsive or altered mental status  Emotional Status Pt's affect, behavior and adjustment status: coping Recent Psychosocial Issues: n/a Psychiatric History: n/a Substance Abuse History: n/a  Patient / Family Perceptions, Expectations & Goals Pt/Family understanding of illness & functional limitations: yes Premorbid pt/family roles/activities: Pt previosuly independent, working, active in the community daily Anticipated changes in roles/activities/participation: Spouse will asssit with task in home Pt/family expectations/goals: MIN A/ [place]: None Premorbid Home Care/DME Agencies: None Transportation available at discharge: Family able to transport Resource referrals recommended: Neuropsychology (coping)  Discharge Planning Living Arrangements: Spouse/significant other Support Systems: Spouse/significant other Type of Residence: Private residence (2 level home, able to maintain on main level  5 steps to enter) Insurance Resources: Magazine features editor (specify) Financial Resources: Employment Financial Screen Referred: No Living Expenses: Own Money Management: Patient,Spouse Does the patient have any problems obtaining your medications?: No Home Management: Previosuly Independent Patient/Family Preliminary Plans: Spouse will assit with medication and money management Care Coordinator Barriers to Discharge: Incontinence,Trach,Other (comments),Nutrition means,Wound Care ([place]) Care Coordinator Barriers to Discharge Comments: Trach, Incontinence, [place], Care Coordinator Anticipated Follow Up Needs: HH/OP Expected  length of stay: 18-22 Days  Clinical Impression SW met with pt at bedside, introduced self and explained role. Pt able to gesture to SW that he needs to use bathroom. Called pt  spouse at bedside provided same information. No questions or concerns currently, sw will cont to follow up  Walter [name] 10/30/2020, 1:29 PM

## 2020-10-30 NOTE — Progress Notes (Signed)
Occupational Therapy Session Note  Patient Details  Name: Walter [name] MRN: [id number] Date of Birth: April 22, 1956  Today's Date: 10/30/2020 OT Individual Time: 1300-1405 OT Individual Time Calculation (min): 65 min    Short Term Goals: Week 1:  OT Short Term Goal 1 (Week 1): Pt will complete BSC or toilet transfer with 1 assist and LRAD OT Short Term Goal 2 (Week 1): Pt will engage in self care or therapeutic activity while sitting unsupported for ~20 minutes with no more than Mod balance assistance OT Short Term Goal 3 (Week 1): Pt will be able to visually scan for 1 ADL item and successfully reach for it with no more than Mod A  Skilled Therapeutic Interventions/Progress Updates:    Patient in bed, finishing lunch with nursing, daughter present for start of session.  O2 via trach collar, HR 90-110 with activity, O2 sat mid - high 90's t/o session.  Patient able to hold cup and bring to mouth with occ assist.  Eye mobility OS/OD intact - ongoing diplopia, OU difficulty with focus in all quadrants.  Reviewed use of patch during reading/functional activity.  Supine to sitting edge of bed with mod A.  Min A to maintain seated balance in unsupported position.  Sit to stand and SPT bed to w/c with mod A, cues for posture and weight shift.  Use of urinal at w/c level mod A.  Max A for hand hygiene.  Oral care completed seated at sink with min A.  Completed seated upper body and trunk mobility activities with mod A.  SPT w/c to bed with mod A.  Attempted use of urinal seated edge of bed with max A for both urinal management and seated balance.  Sit to supine max A.  Patient again needed to void and incontinent on bed and clothing requiring max A of 2 to change patient, linens and donn clean brief.  He remained in bed at close of session, bed alarm set and call bell in hand.    Therapy Documentation Precautions:  Precautions Precautions: Fall,Other (comment) Precaution Comments: NG tube, trach, PMSV,  R hemiparesis, hx of DVTs and PEs Restrictions Weight Bearing Restrictions: No   Therapy/Group: Individual Therapy  Barrie Lyme 10/30/2020, 7:55 AM

## 2020-10-30 NOTE — Progress Notes (Signed)
Patient information reviewed and entered into eRehab System by Becky Marilin Kofman, PPS coordinator. Information including medical coding, function ability, and quality indicators will be reviewed and updated through discharge.   

## 2020-10-30 NOTE — Progress Notes (Signed)
Inpatient Rehabilitation Center Individual Statement of Services  Patient Name:  Walter [name]  Date:  10/30/2020  Welcome to the Inpatient Rehabilitation Center.  Our goal is to provide you with an individualized program based on your diagnosis and situation, designed to meet your specific needs.  With this comprehensive rehabilitation program, you will be expected to participate in at least 3 hours of rehabilitation therapies Monday-Friday, with modified therapy programming on the weekends.  Your rehabilitation program will include the following services:  Physical Therapy (PT), Occupational Therapy (OT), Speech Therapy (ST), 24 hour per day rehabilitation nursing, Therapeutic Recreaction (TR), Neuropsychology, Care Coordinator, Rehabilitation Medicine, Nutrition Services, Pharmacy Services and Other  Weekly team conferences will be held on Wednesdays to discuss your progress.  Your Inpatient Rehabilitation Care Coordinator will talk with you frequently to get your input and to update you on team discussions.  Team conferences with you and your family in attendance may also be held.  Expected length of stay: 18-22 Days  Overall anticipated outcome: Min A/Supervision   Depending on your progress and recovery, your program may change. Your Inpatient Rehabilitation Care Coordinator will coordinate services and will keep you informed of any changes. Your Inpatient Rehabilitation Care Coordinator's name and contact numbers are listed  below.  The following services may also be recommended but are not provided by the Inpatient Rehabilitation Center:    Home Health Rehabiltiation Services  Outpatient Rehabilitation Services    Arrangements will be made to provide these services after discharge if needed.  Arrangements include referral to agencies that provide these services.  Your insurance has been verified to be:  Medicare A & B Your primary doctor is:  Lupe Carney, MD  Pertinent  information will be shared with your doctor and your insurance company.  Inpatient Rehabilitation Care Coordinator:  Lavera Guise, [place] [phone] or [phone]  Information discussed with and copy given to patient by: [name], 10/30/2020, 9:50 AM

## 2020-10-30 NOTE — Progress Notes (Signed)
PROGRESS NOTE   Subjective/Complaints: " I'd like to eat lunch with my wife today"   Oriented to person and place but not situation (NPO) confined to hospital ROS-limited due to cognition , hypophonic dysarthria even with PMV  Objective:   No results found. Recent Labs    10/28/20 0226 10/29/20 0706  WBC  --  6.1  HGB 8.3* 9.0*  HCT 25.6* 28.3*  PLT  --  227   Recent Labs    10/28/20 1845 10/29/20 0706  NA 134* 133*  K 4.0 4.0  CL 101 100  CO2 25 25  GLUCOSE 107* 122*  BUN 8 7*  CREATININE 0.68 0.68  CALCIUM 8.6* 8.6*    Intake/Output Summary (Last 24 hours) at 10/30/2020 0749 Last data filed at 10/30/2020 0600 Gross per 24 hour  Intake 1288 ml  Output --  Net 1288 ml     Pressure Injury 10/21/20 Head Left;Posterior Stage 1 -  Intact skin with non-blanchable redness of a localized area usually over a bony prominence. Pink non-blanchable scalp with thinning hair on posterior left side of head (Active)  10/21/20 1100  Location: Head  Location Orientation: Left;Posterior  Staging: Stage 1 -  Intact skin with non-blanchable redness of a localized area usually over a bony prominence.  Wound Description (Comments): Pink non-blanchable scalp with thinning hair on posterior left side of head  Present on Admission: No    Physical Exam: Vital Signs Blood pressure 127/68, pulse 87, temperature 98.6 F (37 C), temperature source Oral, resp. rate 16, weight 93.8 kg, SpO2 98 %. Gen: no distress, normal appearing HEENT: oral mucosa pink and moist, NCAT, Tracheostomy tube in place.+PMV  General: No acute distress Mood and affect are appropriate Heart: Regular rate and rhythm no rubs murmurs or extra sounds Lungs: Clear to auscultation, breathing unlabored, no rales or wheezes Abdomen: Positive bowel sounds, soft nontender to palpation, nondistended Extremities: No clubbing, cyanosis, or edema  Skin: intact Neuro  4/5 on left side 5/5 on RIght , tone is normal     Comments: reduced judgement,    Assessment/Plan: 1. Functional deficits which require 3+ hours per day of interdisciplinary therapy in a comprehensive inpatient rehab setting.  Physiatrist is providing close team supervision and 24 hour management of active medical problems listed below.  Physiatrist and rehab team continue to assess barriers to discharge/monitor patient progress toward functional and medical goals  Care Tool:  Bathing    Body parts bathed by patient: Right arm,Left arm,Chest,Abdomen,Front perineal area,Face   Body parts bathed by helper: Buttocks,Right upper leg,Left upper leg,Right lower leg,Left lower leg     Bathing assist Assist Level: 2 Helpers     Upper Body Dressing/Undressing Upper body dressing   What is the patient wearing?: Pull over shirt    Upper body assist Assist Level: Maximal Assistance - Patient 25 - 49%    Lower Body Dressing/Undressing Lower body dressing      What is the patient wearing?: Incontinence brief,Pants     Lower body assist Assist for lower body dressing: 2 Helpers     Toileting Toileting    Toileting assist Assist for toileting: Dependent - Patient 0%  Transfers Chair/bed transfer  Transfers assist  Chair/bed transfer activity did not occur: Safety/medical concerns  Chair/bed transfer assist level: 2 Helpers     Locomotion Ambulation   Ambulation assist   Ambulation activity did not occur: Safety/medical concerns          Walk 10 feet activity   Assist  Walk 10 feet activity did not occur: Safety/medical concerns        Walk 50 feet activity   Assist Walk 50 feet with 2 turns activity did not occur: Safety/medical concerns         Walk 150 feet activity   Assist Walk 150 feet activity did not occur: Safety/medical concerns         Walk 10 feet on uneven surface  activity   Assist Walk 10 feet on uneven surfaces  activity did not occur: Safety/medical concerns         Wheelchair     Assist Will patient use wheelchair at discharge?:  (TBD)             Wheelchair 50 feet with 2 turns activity    Assist            Wheelchair 150 feet activity     Assist          Blood pressure 127/68, pulse 87, temperature 98.6 F (37 C), temperature source Oral, resp. rate 16, weight 93.8 kg, SpO2 98 %.  Medical Problem List and Plan: 1.  Right side weakness and slurred speech secondary to intracerebral hemorrhage complicated by hydrocephalus.  Status post EVD placed per neurosurgery             -patient may not shower             -ELOS/Goals: 3 weeks S             -Initial CIR evals today.  2.  Acute PE/bilateral lower extremity DVT:  Status post IVC filter 10/08/2020 with thrombectomy and retrieval of IVC filter 10/28/2020 per interventional radiology -DVT/anticoagulation: Continue Eliquis             -antiplatelet therapy: N/A 3. Pain Management: Decrease oxycodone to q12H prn 4. Mood: Amantadine 200 mg twice daily, may need to reduce if pt becomes agitated              -antipsychotic agents: N/A 5. Neuropsych: This patient is not capable of making decisions on his own behalf. 6. Skin/Wound Care: Routine skin checks 7. Fluids/Electrolytes/Nutrition: Routine in and outs with follow-up chemistries 8.  PAF.  Status post ablation cardioversion in the Past.  Continue Eliquis.  Cardio exam 30 mg every 6 hours.  Cardiac rate controlled 9.  Acute hypoxic respiratory failure.  Tracheostomy 10/04/2020 per DrChand.  Presently with a #6 Shiley.  PMV as tolerated.  Follow-up speech therapy. Daily weights.  10.  Hospital-acquired pneumonia/ventilator associated pneumonia.  Infectious disease follow-up antibiotic therapy completed 11.  Dysphagia.  Dysphagia #1 thin liquids.  Discussing plans for possible PEG tube. Will need assistance with eating 12.  Hyperglycemia related to tube feeds.  Lantus  insulin 6 units daily.  Continue SSI.  Hemoglobin A1c was 5.7 CBG (last 3)  Recent Labs    10/29/20 2009 10/29/20 2319 10/30/20 0358  GLUCAP 116* 127* 132*   Controlled 5/2    LOS: 2 days A FACE TO FACE EVALUATION WAS PERFORMED  Erick Colace 10/30/2020, 7:49 AM

## 2020-10-31 LAB — GLUCOSE, CAPILLARY
Glucose-Capillary: 128 mg/dL — ABNORMAL HIGH (ref 70–99)
Glucose-Capillary: 126 mg/dL — ABNORMAL HIGH (ref 70–99)
Glucose-Capillary: 121 mg/dL — ABNORMAL HIGH (ref 70–99)
Glucose-Capillary: 116 mg/dL — ABNORMAL HIGH (ref 70–99)
Glucose-Capillary: 102 mg/dL — ABNORMAL HIGH (ref 70–99)

## 2020-10-31 NOTE — Progress Notes (Signed)
RN entered the room earlier this morning after being alerted by staff that patient was on edge of bed. Pt was at foot of bed attempting to get urinal off the floor and had nearly fallen off the end of the bed.  Pt dropped urinal and was attempting to reach down to get it. Bed alarm was in place. Pt was educated to call for assistance. Telesitter was ordered due to decreased safety awareness.

## 2020-10-31 NOTE — Progress Notes (Signed)
Speech Language Pathology Daily Session Note  Patient Details  Name: Walter [name] MRN: [id number] Date of Birth: 07-21-1955  Today's Date: 10/31/2020 SLP Individual Time: 0725-0815 SLP Individual Time Calculation (min): 50 min  Short Term Goals: Week 1: SLP Short Term Goal 1 (Week 1): Patient will utilize an increased vocal intensity at the phrase leve to achieve ~50% intelligibility with Mod verbal cues. SLP Short Term Goal 2 (Week 1): Patient will consume current diet with minimal overt s/s of aspiration with Mod verbal cues for use of swallowing compensatory strategies. SLP Short Term Goal 3 (Week 1): Patient will demonstrate sustained attention to functional tasks for 15 minutes with Mod verbal cues for redirection. SLP Short Term Goal 4 (Week 1): Patient will utilize external aids and contextual cues for orientation with Mod multimodal cues. SLP Short Term Goal 5 (Week 1): Patient will demonstrate functional problem solving for basic and familiar tasks with Max A multimodal cues.  Skilled Therapeutic Interventions: Skilled treatment session focused on dysphagia, speech and cognitive goals.  Upon arrival, patient was asleep in bed and easily awakened. PMSV was placed and vitals remained [facility] throughout session. Patient perseverative on specific questions throughout the session and required Max verbal cues for use of an increased vocal intensity to maximize intelligibility and for sustained attention to task. Patient attempted to self-feed but due to decreased coordination, required overall total A for feeding. Patient consumed breakfast meal of Dys. 3 textures with thin liquids without overt s/s of aspiration. Recommend patient continue current diet. Patient intermittently impulsivity throughout meal, especially with liquids resulting in spilling of liquids. PMSV was removed at end of session and patient left upright in bed with alarm on and all needs within reach. Continue with current plan of  care.      Pain No/Denies Pain   Therapy/Group: Individual Therapy  Dakarai Mcglocklin 10/31/2020, 12:30 PM

## 2020-10-31 NOTE — Progress Notes (Signed)
Occupational Therapy Session Note  Patient Details  Name: Walter [name] MRN: [id number] Date of Birth: 09-Apr-1956  Today's Date: 10/31/2020 OT Individual Time: 1000-1107 OT Individual Time Calculation (min): 67 min    Short Term Goals: Week 1:  OT Short Term Goal 1 (Week 1): Pt will complete BSC or toilet transfer with 1 assist and LRAD OT Short Term Goal 2 (Week 1): Pt will engage in self care or therapeutic activity while sitting unsupported for ~20 minutes with no more than Mod balance assistance OT Short Term Goal 3 (Week 1): Pt will be able to visually scan for 1 ADL item and successfully reach for it with no more than Mod A  Skilled Therapeutic Interventions/Progress Updates:    Pt received in bed with RN/NT present, agreeable to therapy, denies pain. Came to sitting EOB with overall mod A to progress BLE off bed and to lift trunk with use of bed features. Maintains static sitting balance with intermittent mod to max A - occasionally requiring to lay backwards on bed 2/2 fatigue. Pt additionally noted to intermittently flex forward at trunk. Doffed/donned shirt max A, pt able to thread RUE, but needs assist with balance and managing trach. Noted ataxic BUE movements. Donned pants total A, pt able to bridge to assist pulling over hips. Squat-pivot to TIS with overall min A + 2 for balance and safety. Req to wash hair, washed hair with hair washing tray. Pt with difficulties following directions to assist holding tray, but able to massage shampoo into his scalp.   Pt left tilted Back in TIS with safety belt alarm engaged, call bell in reach, and all immediate needs met. NT aware of pt position. PMV placed throughout session.   Therapy Documentation Precautions:  Precautions Precautions: Fall,Other (comment) Precaution Comments: NG tube, trach, PMSV, R hemiparesis, hx of DVTs and PEs Restrictions Weight Bearing Restrictions: No Pain: denies   ADL: See Care Tool for more  details.    Therapy/Group: Individual Therapy  [name] MS, OTR/L  10/31/2020, 6:43 AM

## 2020-10-31 NOTE — Progress Notes (Signed)
Physical Therapy Session Note  Patient Details  Name: Walter [name] MRN: [id number] Date of Birth: 03/15/1956  Today's Date: 11/01/2020 PT Individual Time:  4270-6237 PT Individual Time Calculation (min): 73 min  Short Term Goals: Week 1:  PT Short Term Goal 1 (Week 1): Pt will consistently perform supine<>sit with mod assist PT Short Term Goal 2 (Week 1): Pt will perform sit<>stands using LRAD with +2 mod assist PT Short Term Goal 3 (Week 1): Pt will perform bed<>chair transfers using LRAD with +2 mod assist PT Short Term Goal 4 (Week 1): Pt will tolerate sitting upright, OOB in TIS w/c for at least 2 hours outside of therapy sessions PT Short Term Goal 5 (Week 1): Pt will ambulate at least 4ft using LRAD with +2 mod assist  Skilled Therapeutic Interventions/Progress Updates:  Patient seated upright in TIS w/c on entrance to room. NT completing vitals and RN completing med pass at start of session. Patient alert and agreeable to PT session. Patient denied pain throughout session.  Therapeutic Activity: Bed Mobility: Patient performed sit--> supine with Min A for BLE to bed surface and heavy vc for initiation and sequencing of start of movement to sidelying position in bed. VC/ tc required for completion and technique.  Transfers: Patient performed STS transfers with Min A +2. All SPVT, and toilet transfers this session with Mod A +2 for safety/ balance. Pt required vc and instruction prior to performance and then continued multimodal cueing for technique and proper performance. Pt very impulsive when relating need to toilet during session. STEDY setup for transfer to toilet and pt stands up without feet on platform and attempting to step from between w/c and STEDY placement in order to reach toilet. Requires max cues and increased volume and intensity in tone of voice for pt to return to seated position in w/c to set up for safe SPVT transfer. Mod A +2.   Neuromuscular Re-ed: NMR  facilitated during session with focus on standing balance. Pt guided in static standing balance with focus on wider foot positioning for improved BOS and proprioceptive training with pt responding to cueing for upright posture and balance. NMR performed for improvements in motor control and coordination, balance, sequencing, judgement, and self confidence/ efficacy in performing all aspects of mobility at highest level of independence.   Patient supine in bed at end of session with brakes locked, bed alarm set, and all needs within reach.   FiO2 in room at 5L/ 36% during session and brought to 3L O2 via canister and Gardner. Sp O2 throughout session checked with pt remaining WFL between 95-100%.   Therapy Documentation Precautions:  Precautions Precautions: Fall,Other (comment) Precaution Comments: NG tube, trach, PMSV, R hemiparesis, hx of DVTs and PEs Restrictions Weight Bearing Restrictions: No Vital Signs: Therapy Vitals Pulse Rate: 86 Resp: 18 Oxygen Therapy SpO2: 95 % O2 Device: Tracheostomy Collar O2 Flow Rate (L/min): 5 L/min FiO2 (%): 28 %  Therapy/Group: Individual Therapy  Loel Dubonnet PT, DPT 10/31/2020, 1:06 PM

## 2020-10-31 NOTE — Progress Notes (Signed)
PROGRESS NOTE   Subjective/Complaints: Pt had questions about why he is haing repeat thrombectomy and if Dr Johney Frame knows about it  Appreciate trach team eval  ROS-limited due to cognition , hypophonic dysarthria even with PMV  Objective:   No results found. Recent Labs    10/29/20 0706  WBC 6.1  HGB 9.0*  HCT 28.3*  PLT 227   Recent Labs    10/28/20 1845 10/29/20 0706  NA 134* 133*  K 4.0 4.0  CL 101 100  CO2 25 25  GLUCOSE 107* 122*  BUN 8 7*  CREATININE 0.68 0.68  CALCIUM 8.6* 8.6*    Intake/Output Summary (Last 24 hours) at 10/31/2020 4098 Last data filed at 10/30/2020 1902 Gross per 24 hour  Intake 150 ml  Output --  Net 150 ml     Pressure Injury 10/21/20 Head Left;Posterior Stage 1 -  Intact skin with non-blanchable redness of a localized area usually over a bony prominence. Pink non-blanchable scalp with thinning hair on posterior left side of head (Active)  10/21/20 1100  Location: Head  Location Orientation: Left;Posterior  Staging: Stage 1 -  Intact skin with non-blanchable redness of a localized area usually over a bony prominence.  Wound Description (Comments): Pink non-blanchable scalp with thinning hair on posterior left side of head  Present on Admission: No    Physical Exam: Vital Signs Blood pressure 124/72, pulse 82, temperature 98 F (36.7 C), resp. rate 20, weight 90.6 kg, SpO2 94 %. Gen: no distress, normal appearing HEENT: oral mucosa pink and moist, NCAT, Tracheostomy tube in place.+PMV  General: No acute distress Mood and affect are appropriate Heart: Regular rate and rhythm no rubs murmurs or extra sounds Lungs: Clear to auscultation, breathing unlabored, no rales or wheezes Abdomen: Positive bowel sounds, soft nontender to palpation, nondistended Extremities: No clubbing, cyanosis, or edema, no calf pain with palpation  Skin: No evidence of breakdown, no evidence of  rash  Skin: intact Neuro 4/5 on left side 5/5 on RIght , tone is normal  Severe dysmetria Left finger nose finger     Comments: reduced judgement,    Assessment/Plan: 1. Functional deficits which require 3+ hours per day of interdisciplinary therapy in a comprehensive inpatient rehab setting.  Physiatrist is providing close team supervision and 24 hour management of active medical problems listed below.  Physiatrist and rehab team continue to assess barriers to discharge/monitor patient progress toward functional and medical goals  Care Tool:  Bathing    Body parts bathed by patient: Right arm,Left arm,Chest,Abdomen,Front perineal area,Face   Body parts bathed by helper: Buttocks,Right upper leg,Left upper leg,Right lower leg,Left lower leg     Bathing assist Assist Level: 2 Helpers     Upper Body Dressing/Undressing Upper body dressing   What is the patient wearing?: Pull over shirt    Upper body assist Assist Level: Maximal Assistance - Patient 25 - 49%    Lower Body Dressing/Undressing Lower body dressing      What is the patient wearing?: Pants,Incontinence brief     Lower body assist Assist for lower body dressing: Dependent - Patient 0%     Toileting Toileting  Toileting assist Assist for toileting: Total Assistance - Patient < 25%     Transfers Chair/bed transfer  Transfers assist  Chair/bed transfer activity did not occur: Safety/medical concerns  Chair/bed transfer assist level: 2 Helpers (STEDY with Min A +2)     Locomotion Ambulation   Ambulation assist   Ambulation activity did not occur: Safety/medical concerns          Walk 10 feet activity   Assist  Walk 10 feet activity did not occur: Safety/medical concerns        Walk 50 feet activity   Assist Walk 50 feet with 2 turns activity did not occur: Safety/medical concerns         Walk 150 feet activity   Assist Walk 150 feet activity did not occur: Safety/medical  concerns         Walk 10 feet on uneven surface  activity   Assist Walk 10 feet on uneven surfaces activity did not occur: Safety/medical concerns         Wheelchair     Assist Will patient use wheelchair at discharge?:  (TBD)             Wheelchair 50 feet with 2 turns activity    Assist            Wheelchair 150 feet activity     Assist          Blood pressure 124/72, pulse 82, temperature 98 F (36.7 C), resp. rate 20, weight 90.6 kg, SpO2 94 %.  Medical Problem List and Plan: 1.  Right side weakness and slurred speech secondary to intracerebral hemorrhage complicated by hydrocephalus.  Status post EVD placed per neurosurgery             -patient may not shower             -ELOS/Goals: 3 weeks              -cont PT. ,OT team conf in am  2.  Acute PE/bilateral lower extremity DVT:  Status post IVC filter 10/08/2020 with thrombectomy and retrieval of IVC filter 10/28/2020 per interventional radiology (still has suprarenal filter ) No RLE, scheduled for repeat thrombectomy this week -DVT/anticoagulation: Continue Eliquis- unless this needs to be stopped for IR procedure              -antiplatelet therapy: N/A 3. Pain Management: Decrease oxycodone to q12H prn 4. Mood: Amantadine 200 mg twice daily, may need to reduce if pt becomes agitated              -antipsychotic agents: N/A 5. Neuropsych: This patient is not capable of making decisions on his own behalf. 6. Skin/Wound Care: Routine skin checks 7. Fluids/Electrolytes/Nutrition: Routine in and outs with follow-up chemistries 8.  PAF.  Status post ablation cardioversion in the Past.  Continue Eliquis.  Cardio exam 30 mg every 6 hours.  Cardiac rate controlled 9.  Acute hypoxic respiratory failure.  Tracheostomy 10/04/2020 per DrChand.  Presently with a #6 Shiley.  PMV as tolerated.  Follow-up speech therapy. Daily weights.  10.  Hospital-acquired pneumonia/ventilator associated pneumonia.   Infectious disease follow-up antibiotic therapy completed 11.  Dysphagia.  Dysphagia #1 thin liquids.  Discussing plans for possible PEG tube. Will need assistance with eating 12.  Hyperglycemia related to tube feeds.  Lantus insulin 6 units daily.  Continue SSI.  Hemoglobin A1c was 5.7 CBG (last 3)  Recent Labs    10/30/20 2024 10/30/20 2344 10/31/20 0409  GLUCAP 151* 109* 128*   Controlled 5/3    LOS: 3 days A FACE TO FACE EVALUATION WAS PERFORMED  Walter [name] 10/31/2020, 7:48 AM

## 2020-11-01 LAB — GLUCOSE, CAPILLARY
Glucose-Capillary: 102 mg/dL — ABNORMAL HIGH (ref 70–99)
Glucose-Capillary: 108 mg/dL — ABNORMAL HIGH (ref 70–99)
Glucose-Capillary: 110 mg/dL — ABNORMAL HIGH (ref 70–99)
Glucose-Capillary: 111 mg/dL — ABNORMAL HIGH (ref 70–99)
Glucose-Capillary: 118 mg/dL — ABNORMAL HIGH (ref 70–99)
Glucose-Capillary: 152 mg/dL — ABNORMAL HIGH (ref 70–99)

## 2020-11-01 MED ORDER — OSMOLITE 1.5 CAL PO LIQD
500.0000 mL | ORAL | Status: DC
  Administered 2020-11-01 – 2020-11-05 (×5): 500 mL
  Filled 2020-11-01: qty 1000
  Filled 2020-11-01: qty 711
  Filled 2020-11-01 (×4): qty 1000

## 2020-11-01 NOTE — Progress Notes (Signed)
Physical Therapy Session Note  Patient Details  Name: Walter [name] MRN: [id number] Date of Birth: 31-Jan-1956  Today's Date: 11/01/2020 PT Individual Time: 1535-1630 PT Individual Time Calculation (min): 55 min   Short Term Goals: Week 1:  PT Short Term Goal 1 (Week 1): Pt will consistently perform supine<>sit with mod assist PT Short Term Goal 2 (Week 1): Pt will perform sit<>stands using LRAD with +2 mod assist PT Short Term Goal 3 (Week 1): Pt will perform bed<>chair transfers using LRAD with +2 mod assist PT Short Term Goal 4 (Week 1): Pt will tolerate sitting upright, OOB in TIS w/c for at least 2 hours outside of therapy sessions PT Short Term Goal 5 (Week 1): Pt will ambulate at least 73f using LRAD with +2 mod assist  Skilled Therapeutic Interventions/Progress Updates:   Pt received supine in bed and agreeable to PT. Supine>sit transfer with supevisiion assist from PT with cues for safety and midline once EOB. throughout session pt performed stand pivot transfers x 4 with mod assist and +2 present for safety; UE support on throughout on PT shoulders. Pt performed sit<>stand in parallel bars x 4 with min assist. pregait reciprocal stepping x 8 BLE. Gait training in parallel bars x 643fforward and reverse with min A and +2 for WC follow. Gait training with BUE support on therapist and RT in three musketeer assist, [id number] mod assist and moderate cues for wide BOS improved posture step height. nustep reciprocal movement training 4 min +3 min with min-mod assist throughout to improve UE support on R and maintain midline. Urination in standing with UE support on therapist shoulder and cues for terminal knee extension. Pt returned to room and performed stand piovt transfer to bed with mod assist as listed. Sit>supine completed with ** and left supine in bed with call bell in reach and all needs met.        Therapy Documentation Precautions:  Precautions Precautions: Fall,Other  (comment) Precaution Comments: NG tube, trach, PMSV, R hemiparesis, hx of DVTs and PEs Restrictions Weight Bearing Restrictions: No General:   Vital Signs: Therapy Vitals Pulse Rate: 85 Resp: 18 Patient Position (if appropriate): Lying Oxygen Therapy SpO2: 98 % O2 Device: Tracheostomy Collar O2 Flow Rate (L/min): 5 L/min FiO2 (%): 28 % Pain: denies   Therapy/Group: Individual Therapy  AustLorie Phenix/2022, 5:45 PM

## 2020-11-01 NOTE — Patient Care Conference (Signed)
Inpatient RehabilitationTeam Conference and Plan of Care Update Date: 11/01/2020   Time: 10:05 AM    Patient Name: Walter [name]      Medical Record Number: [id number]  Date of Birth: [birth year] Sex: Male         Room/Bed: 4M05C/4M05C-01 Payor Info: Payor: MEDICARE / Plan: MEDICARE PART A AND B / Product Type: *No Product type* /    Admit Date/Time:  10/28/2020  5:33 PM  Primary Diagnosis:  ICH (intracerebral hemorrhage) [facility])  Hospital Problems: Principal Problem:   ICH (intracerebral hemorrhage) (HCC) Active Problems:   Tracheostomy care [facility])    Expected Discharge Date: Expected Discharge Date: 11/23/20  Team Members Present: Physician leading conference: Dr. Claudette Laws Care Coodinator Present: Chana Bode, RN, BSN, CRRN;Christina Coto [place], BSW Nurse Present: Chana Bode, RN PT Present: Grier Rocher, Grayland Ormond, PT OT Present: Other (comment) Annye English, OT) SLP Present: Feliberto Gottron, SLP PPS Coordinator present : Fae Pippin, SLP     Current Status/Progress Goal Weekly Team Focus  Bowel/Bladder             Swallow/Nutrition/ Hydration   Dys. 3 textures with thin liquids, Min A  Supervision  tolerance of current diet, use of strategies, possible trials of regular textures   ADL's   Max UBD/UBB Total A LBD/LBB, mod + 2 for safety for functional transfers, mod A for static sitting, with support - mod A for grooming  S to min A (toileting, bathing)  NMR, static/dynamic sitting balance, functional transfers, activity tolerance, midline orientation   Mobility   Bed mobility = supervision to min A; transfers mod A +2 for coordination/ balance/ motor control; no gait yet. no w/c training yet  bed mobility = supervision; transfers = CGA/ Min A; gait = Min A with LRAD for household distances  NMR for improved motor control/ planning and reducing impulsivity, standing balance, improving activity tolerance, strength   Communication   #6 cuffless trach  with PMSV during all waking hours with full supervision, Mod-Max A for vocal intensity and overall intelligibility  Supervision  use of an increased vocal intensity to maximize intelligibility   Safety/Cognition/ Behavioral Observations  Mod-Max A  Supervision  recall, attention, awareness   Pain             Skin               Discharge Planning:  Pt to d/c home with spouse. Avaliable 24/7 2 level home, 5 steps to enter (able to maintain on main level)   Team Discussion: Post ICH with respiratory failure = #6 cuffless trach and PMSV; hopeful to wean off trach for discharge. Taking po nutrition well on D3 , thin diet and should be able to d/c cortrak for discharge. MD decreasing HS feed rate to improve appetite and encourage po intake. DVT with 2 IVC filters; one left is clogged. Per MD, plan to complete IVC thrombectomy and continue Eliquis.   Patient on target to meet rehab goals: yes, currently max assist for upper body bathing, min assist for grooming. Mod assist for transfers +2 for safety. CGA - min assist goals set for discharge.  *See Care Plan and progress notes for long and short-term goals.   Revisions to Treatment Plan:   Teaching Needs: Transfers, toileting, secondary stroke risk management, medication management, etc.   Current Barriers to Discharge: Decreased caregiver support, Home enviroment access/layout, Trach and Incontinence and nutritional means  Possible Resolutions to Barriers: Family education    Medical Summary  Current Status: trach, feeding tube, incont bowel and bladder , MASD  Barriers to Discharge: Medical stability;Nutrition means;Pending Surgery;Trach   Possible Resolutions to [employer] Focus: IVC thrombectomy this week, cont trach weaning, increase po intake   Continued Need for Acute Rehabilitation Level of Care: The patient requires daily medical management by a physician with specialized training in physical medicine and rehabilitation  for the following reasons: Direction of a multidisciplinary physical rehabilitation program to maximize functional independence : Yes Medical management of patient stability for increased activity during participation in an intensive rehabilitation regime.: Yes Analysis of laboratory values and/or radiology reports with any subsequent need for medication adjustment and/or medical intervention. : Yes   I attest that I was present, lead the team conference, and concur with the assessment and plan of the team.   Chana Bode B 11/01/2020, 1:18 PM

## 2020-11-01 NOTE — Progress Notes (Signed)
Occupational Therapy Session Note  Patient Details  Name: Walter [name] MRN: [id number] Date of Birth: August 30, 1955  Today's Date: 11/01/2020 OT Individual Time: 7902-4097 OT Individual Time Calculation (min): 74 min    Short Term Goals: Week 1:  OT Short Term Goal 1 (Week 1): Pt will complete BSC or toilet transfer with 1 assist and LRAD OT Short Term Goal 2 (Week 1): Pt will engage in self care or therapeutic activity while sitting unsupported for ~20 minutes with no more than Mod balance assistance OT Short Term Goal 3 (Week 1): Pt will be able to visually scan for 1 ADL item and successfully reach for it with no more than Mod A  Skilled Therapeutic Interventions/Progress Updates:    Pt received semi-reclined in bed, agreeable to therapy. Denies pain throughout session. Req to change brief as it was soaked with incontinent urine. Changed brief and performed pericare total A, pt is able to bridge to assist pulling brief up over his hips. Came to sitting EOB with overall mod A to progress BLE off bed and lift trunk. Maintains static sitting balance with consistent CGA. Stedy transfer with CGA for STS to sink. Pt maintained static sitting balance in stedy with overall min A to CGA to complete various grooming tasks. Cont to demonstrate ataxic movements with RUE, req min A for thoroughly completing tasks. Donned shirt mod A to pull over head, pt able to thread BUE. TIS transport to and from gym. Squat-pivot to and from mat with min A + 2 for safety/equipment management. STS via three musketeers technique min A + 2. STS with RW min A + 2 for balance. Seated, targeted dynamic sitting balance and functional grasp by retrieving/placing cones from knee to shoulder height. Noted improved with repition, cont to req consistent VCs to maintain upright posture, but only req intermittent min A to return to midline. Provided pt with tilted, weighted, and built up handle utensils to trial for self-feeding later with  NT/SLP. Noted improvement with tilted spoon to scoop and bring food to mouth. Will cont to trial. Pt left in TIS with safety belt alarm engaged, NT present, and telesitter on.    Therapy Documentation Precautions:  Precautions Precautions: Fall,Other (comment) Precaution Comments: NG tube, trach, PMSV, R hemiparesis, hx of DVTs and PEs Restrictions Weight Bearing Restrictions: No Pain: denies   ADL: See Care Tool for more details.   Therapy/Group: Individual Therapy  Claudie Revering MS, OTR/L  11/01/2020, 6:44 AM

## 2020-11-01 NOTE — Progress Notes (Signed)
Patient ID: Walter [name], male   DOB: 1956/01/17, 65 y.o.   MRN: [id number] Team Conference Report to Patient/Family  Team Conference discussion was reviewed with the patient and caregiver, including goals, any changes in plan of care and target discharge date.  Patient and caregiver express understanding and are in agreement.  The patient has a target discharge date of 11/23/20.  Sw met with pt and spouse in room. Sw provided conference updates to pt spouse. Spouse concerned it pt procedure with radiology will take place Thursday or Friday, sw will cont to follow up.   Dyanne Iha 11/01/2020, 1:52 PM

## 2020-11-01 NOTE — Consult Note (Signed)
Neuropsychological Consultation   Patient:   Walter [name]   DOB:   [birth year]  MR Number:  [id number]  Location:  [facility] [address] CENTER B [id number] [place] [address] Dept: [phone] Loc: [phone]           Date of Service:   11/01/2020  Start Time:   1 PM End Time:   2 PM  Provider/Observer:  Arley Phenix, Psy.D.       Clinical Neuropsychologist       Billing Code/Service: 36644  Chief Complaint:    Walter [name] is a 65 year old male with a prior medical history including A. fib with ablation and not on any anticoagulation medications.  Patient presented on 09/17/2020 with right-sided weakness, headache and dysarthria along with vomiting.  Cranial CT/MRI showed acute hemorrhage in dorsal left pons with subarachnoid extension into the cerebellar aqueduct and fourth ventricle.  CT angiogram head neck no vascular malformation, anomaly or aneurysm.  Alcohol and urine drug screen were negative.  Patient required emergent intubation for airway protection.  Hospital course complicated by hydrocephalus with diagnostic cerebral angiogram completed showing no evidence of aneurysm.  Repeat head CT on 09/29/2020 showed new intraventricular hemorrhage in the third and lateral ventricles.  Fourth ventricle and midbrain hemorrhage was unchanged and again repeated 10/24/2020 showing improvement in hemorrhage and edema along the shunt tract in the right frontal lobe.  Patient with long-term vent support.  On 10/25/2020 Doppler showed acute DVT right common femoral artery and SF junction right femoral vein and right proximal profunda vein right popliteal vein and right posterior tibial vein.  Eliquis was initiated.  Chest CT also showed right greater than left pulmonary emboli and no evidence of heart strain and continued IVC filter as well as Eliquis.  Patient continued with trach as well as PEG tube and referred for  comprehensive inpatient rehabilitation therapy once therapy assessments were completed.  Reason for Service:  Patient with significant neurological deficits and cognitive deficits.  Patient referred for neuropsychological consultation.  HPI: Walter [name] is a 65 year old right-handed male history of atrial fibrillation with ablation not on anticoagulation followed by Dr. Johney Frame.  Patient lives with spouse independent prior to admission working as a Veterinary surgeon.  Presented 09/17/2020 with right side weakness headache and dysarthria as well as vomiting.    Cranial CT/MRI showed acute intraparenchymal hemorrhage in dorsal left pons with subarachnoid extension into the cerebral aqueduct and fourth ventricle.  CT angiogram head and neck no vascular malformation, anomaly or aneurysm.  Echocardiogram with ejection fraction of 55 to 60% grade 1 diastolic dysfunction no regional wall motion abnormalities.  Admission chemistries unremarkable aside glucose 169 alcohol negative urine drug screen negative.  Patient did require emergent intubation for airway protection.  Hospital course complicated by hydrocephalus with diagnostic cerebral angiogram completed showing no evidence of aneurysm.  EVD was placed per neurosurgery.  Maintained on 3% hypertonic saline.  Patient was extubated 09/28/2020.  Follow-up cardiology services for atrial fibrillation maintained on Cardizem.  Patient was cleared to begin Lovenox for DVT prophylaxis 09/28/2020.  Repeat head CT 09/29/2020 showed new intraventricular hemorrhage in the third and lateral ventricles.  Fourth ventricular and midbrain hemorrhage unchanged and again repeated 10/24/2020 showing improving hemorrhage and edema along the shunt tract in the right frontal lobe.  No new area of hemorrhage..  Patient with long-term ventilatory support undergone tracheostomy 10/04/2020 per Dr.Chand critical care services.  On 10/08/2020 patient found  to have bilateral lower extremity DVTs and underwent  placement of IVC filter placement per interventional radiology.  Dopplers again completed 10/25/2020 showing acute DVT right common femoral vein and SF junction right femoral vein and right proximal profunda vein right popliteal vein and right posterior tibial vein right peroneal vein right soleal vein and right gastrocnemius vein it was established the patient had an IVC filter maintained and Eliquis was also initiated.  Patient with fever of unknown origin infectious disease consulted broad-spectrum antibiotics  initiated.  SARS coronavirus negative.  CT chest abdomen pelvis did show bilateral large volume right greater than left pulmonary emboli no evidence of right heart strain and it was discussed to continue IVC filter as well as Eliquis.  Interventional radiology consulted to discuss possible removal of IVC filter with CT venogram 10/27/2020 showed thrombus involving the IVC and iliac veins.  Large amount of thrombus involving the infrarenal IVC and IVC filter.  Suprarenal IVC was patent.  Thrombus in the right iliac veins and thrombus in the proximal femoral veins bilaterally and plan was to undergo thrombectomy and retrieval of IVC filter 10/27/2020 per Dr. Deanne Coffer of interventional radiology..  Patient initially n.p.o. with alternative means of nutritional support advanced to dysphagia #3 thin liquids and also discussion of possible need for PEG tube.  Therapy evaluations were completed due to patient decreased functional mobility was admitted for a comprehensive rehab program. Very eager to go home. Wife at bedside.  Current Status:  Patient was laying in bed awake but drowsy.  Patient's wife was in the room with him.  Patient very hard to understand but he did try to vocalize several statements but his strength and volume is very low with a lot of noises going on in the room for airway support.  Patient did display indications that he was aware he was in the hospital and it had significant medical  events.   Behavioral Observation: Walter [name]  presents as a 65 y.o.-year-old Right Caucasian Male who appeared his stated age. his dress was Appropriate and he was Well Groomed and his manners were Appropriate to the situation.  his participation was indicative of Appropriate and Inattentive behaviors.  There were physical disabilities noted.  he displayed an appropriate level of cooperation and motivation.     Interactions:    Minimal Drowsy and Inattentive  Attention:   abnormal and attention span appeared shorter than expected for age  Memory:   abnormal; global memory impairment noted  Visuo-spatial:  not examined  Speech (Volume):  low  Speech:   garbled; slurred  Thought Process:  Circumstantial  Though Content:  WNL;   Orientation:   person and place  Judgment:   Poor  Planning:   Poor  Affect:    Blunted and Lethargic  Mood:    Dysphoric  Insight:   Shallow  Intelligence:   high  Medical History:   Past Medical History:  Diagnosis Date  . Atrial flutter (HCC)   . Dizzy 09/25/13  . Near syncope 09/25/13         Patient Active Problem List   Diagnosis Date Noted  . Tracheostomy care (HCC)   . Pressure injury of skin 10/23/2020  . Intracranial hemorrhage (HCC)   . Atrial fibrillation (HCC)   . Prediabetes   . Leukocytosis   . Acute blood loss anemia   . Hypernatremia   . ICH (intracerebral hemorrhage) (HCC) 09/25/2020  . Unilateral primary osteoarthritis, left knee 09/26/2016  . Atrial flutter (HCC)  09/25/2013  . Near syncope 09/25/2013     Psychiatric History:  Notes prior psychiatric history noted  Family Med/Psych History:  Family History  Problem Relation Age of Onset  . Heart disease Mother   . Heart disease Father   . Healthy Sister   . Healthy Brother    Impression/DX:  SON BARKAN is a 65 year old male with a prior medical history including A. fib with ablation and not on any anticoagulation medications.  Patient presented on  09/17/2020 with right-sided weakness, headache and dysarthria along with vomiting.  Cranial CT/MRI showed acute hemorrhage in dorsal left pons with subarachnoid extension into the cerebellar aqueduct and fourth ventricle.  CT angiogram head neck no vascular malformation, anomaly or aneurysm.  Alcohol and urine drug screen were negative.  Patient required emergent intubation for airway protection.  Hospital course complicated by hydrocephalus with diagnostic cerebral angiogram completed showing no evidence of aneurysm.  Repeat head CT on 09/29/2020 showed new intraventricular hemorrhage in the third and lateral ventricles.  Fourth ventricle and midbrain hemorrhage was unchanged and again repeated 10/24/2020 showing improvement in hemorrhage and edema along the shunt tract in the right frontal lobe.  Patient with long-term vent support.  On 10/25/2020 Doppler showed acute DVT right common femoral artery and SF junction right femoral vein and right proximal profunda vein right popliteal vein and right posterior tibial vein.  Eliquis was initiated.  Chest CT also showed right greater than left pulmonary emboli and no evidence of heart strain and continued IVC filter as well as Eliquis.  Patient continued with trach as well as PEG tube and referred for comprehensive inpatient rehabilitation therapy once therapy assessments were completed.  Patient was laying in bed awake but drowsy.  Patient's wife was in the room with him.  Patient very hard to understand but he did try to vocalize several statements but his strength and volume is very low with a lot of noises going on in the room for airway support.  Patient did display indications that he was aware he was in the hospital and it had significant medical events.  Disposition/Plan:  Patient with severe physical and cognitive deficits after significant subcortical hemorrhage as well as cortical transformation.  Difficult to get much information from the patient due to  weakness and lethargy and expressive language changes.  I was able to talk with his wife and address some of the question she had the best we can at this point given the information as far as his recovery course.  I will follow-up with the patient next week.  Diagnosis:    Thrombosis - Plan: IR THROMBECT VENO MECH MOD SED, IR THROMBECT VENO MECH MOD SED         Electronically Signed   _______________________ Arley Phenix, Psy.D. Clinical Neuropsychologist

## 2020-11-01 NOTE — Progress Notes (Signed)
PROGRESS NOTE   Subjective/Complaints: Per IR needs debulking of clot in IVC, discussed with wife and husband at bedside Discussed prognosis for decannulation an dpo feeds (D/C feeding tube) which is good, discussed ataxia, balance and cognitive issues   ROS-limited due to cognition , hypophonic dysarthria even with PMV  Objective:   No results found. No results for input(s): WBC, HGB, HCT, PLT in the last 72 hours. No results for input(s): NA, K, CL, CO2, GLUCOSE, BUN, CREATININE, CALCIUM in the last 72 hours.  Intake/Output Summary (Last 24 hours) at 11/01/2020 0757 Last data filed at 10/31/2020 2351 Gross per 24 hour  Intake 1160 ml  Output 250 ml  Net 910 ml     Pressure Injury 10/21/20 Head Left;Posterior Stage 1 -  Intact skin with non-blanchable redness of a localized area usually over a bony prominence. Pink non-blanchable scalp with thinning hair on posterior left side of head (Active)  10/21/20 1100  Location: Head  Location Orientation: Left;Posterior  Staging: Stage 1 -  Intact skin with non-blanchable redness of a localized area usually over a bony prominence.  Wound Description (Comments): Pink non-blanchable scalp with thinning hair on posterior left side of head  Present on Admission: No    Physical Exam: Vital Signs Blood pressure 119/75, pulse 80, temperature 98 F (36.7 C), resp. rate 18, weight 90.8 kg, SpO2 96 %. Gen: no distress, normal appearing HEENT: oral mucosa pink and moist, NCAT, Tracheostomy tube in place.+PMV Speech dysphonia  General: No acute distress Mood and affect are appropriate Heart: Regular rate and rhythm no rubs murmurs or extra sounds Lungs: Clear to auscultation, breathing unlabored, no rales or wheezes Abdomen: Positive bowel sounds, soft nontender to palpation, nondistended Extremities: No clubbing, cyanosis, or edema  Skin: intact Neuro 4/5 on left side 5/5 on RIght ,  tone is normal  Severe dysmetria Left finger nose finger     Comments: reduced judgement,    Assessment/Plan: 1. Functional deficits which require 3+ hours per day of interdisciplinary therapy in a comprehensive inpatient rehab setting.  Physiatrist is providing close team supervision and 24 hour management of active medical problems listed below.  Physiatrist and rehab team continue to assess barriers to discharge/monitor patient progress toward functional and medical goals  Care Tool:  Bathing    Body parts bathed by patient: Right arm,Left arm,Chest,Abdomen,Front perineal area,Face   Body parts bathed by helper: Buttocks,Right upper leg,Left upper leg,Right lower leg,Left lower leg     Bathing assist Assist Level: 2 Helpers     Upper Body Dressing/Undressing Upper body dressing   What is the patient wearing?: Pull over shirt    Upper body assist Assist Level: Maximal Assistance - Patient 25 - 49%    Lower Body Dressing/Undressing Lower body dressing      What is the patient wearing?: Pants     Lower body assist Assist for lower body dressing: Total Assistance - Patient < 25%     Toileting Toileting    Toileting assist Assist for toileting: Total Assistance - Patient < 25%     Transfers Chair/bed transfer  Transfers assist  Chair/bed transfer activity did not occur: Safety/medical concerns  Chair/bed  transfer assist level: 2 Helpers     Locomotion Ambulation   Ambulation assist   Ambulation activity did not occur: Safety/medical concerns          Walk 10 feet activity   Assist  Walk 10 feet activity did not occur: Safety/medical concerns        Walk 50 feet activity   Assist Walk 50 feet with 2 turns activity did not occur: Safety/medical concerns         Walk 150 feet activity   Assist Walk 150 feet activity did not occur: Safety/medical concerns         Walk 10 feet on uneven surface  activity   Assist Walk 10 feet  on uneven surfaces activity did not occur: Safety/medical concerns         Wheelchair     Assist Will patient use wheelchair at discharge?:  (TBD)             Wheelchair 50 feet with 2 turns activity    Assist            Wheelchair 150 feet activity     Assist          Blood pressure 119/75, pulse 80, temperature 98 F (36.7 C), resp. rate 18, weight 90.8 kg, SpO2 96 %.  Medical Problem List and Plan: 1.  Right side weakness and slurred speech secondary to intracerebral hemorrhage complicated by hydrocephalus.  Status post EVD placed per neurosurgery             -patient may not shower             -ELOS/Goals: 3 weeks              -cont PT. ,OT team conf in am  2.  Acute PE/bilateral lower extremity DVT:  Status post IVC filter 10/08/2020 with thrombectomy and retrieval of IVC filter 10/28/2020 per interventional radiology (still has suprarenal filter ) No RLE, scheduled for repeat thrombectomy this week -DVT/anticoagulation: Continue Eliquis- unless this needs to be stopped for IR procedure              -antiplatelet therapy: N/A 3. Pain Management: Decrease oxycodone to q12H prn 4. Mood: Amantadine 200 mg twice daily, may need to reduce if pt becomes agitated              -antipsychotic agents: N/A 5. Neuropsych: This patient is not capable of making decisions on his own behalf. 6. Skin/Wound Care: Routine skin checks 7. Fluids/Electrolytes/Nutrition: Routine in and outs with follow-up chemistries 8.  PAF.  Status post ablation cardioversion in the Past.  Continue Eliquis.  Cardio exam 30 mg every 6 hours.  Cardiac rate controlled 9.  Acute hypoxic respiratory failure.  Tracheostomy 10/04/2020 per DrChand.  Presently with a #6 Shiley.  PMV as tolerated.  Follow-up speech therapy. Daily weights.  10.  Hospital-acquired pneumonia/ventilator associated pneumonia.  Infectious disease follow-up antibiotic therapy completed 11.  Dysphagia.  Dysphagia #3 thin  liquids.   Will need assistance with eating, reduce TF volume to improve appetite 12.  Hyperglycemia related to tube feeds.  Lantus insulin 6 units daily.  Continue SSI.  Hemoglobin A1c was 5.7 CBG (last 3)  Recent Labs    10/31/20 2001 11/01/20 0004 11/01/20 0428  GLUCAP 102* 118* 152*   Controlled 5/4    LOS: 4 days A FACE TO FACE EVALUATION WAS PERFORMED  Erick Colace 11/01/2020, 7:57 AM

## 2020-11-01 NOTE — Progress Notes (Signed)
Supervising Physician: Simonne Come  Patient Status:  [facility] - In-pt  Chief Complaint: Occlusive caval thrombus  Subjective: Patient remains on remain.  Tolerating trach collar well.  Remains on tube feeding.  Wife at bedside.  Discussed planned procedure tomorrow.    Allergies: Keppra [levetiracetam] and Latex  Medications: Prior to Admission medications   Medication Sig Start Date End Date Taking? Authorizing Provider  amantadine (SYMMETREL) 50 MG/5ML solution Place 20 mLs (200 mg total) into feeding tube 2 (two) times daily. 10/28/20   Mikhail, Nita Sells, DO  apixaban (ELIQUIS) 5 MG TABS tablet Place 1 tablet (5 mg total) into feeding tube 2 (two) times daily. 10/28/20   Edsel Petrin, DO  cholecalciferol (VITAMIN D3) 25 MCG (1000 UNIT) tablet Take 1,000 Units by mouth daily.    [provider]  diltiazem (CARDIZEM) 10 mg/ml oral suspension Place 3 mLs (30 mg total) into feeding tube every 6 (six) hours. 10/28/20   Mikhail, Nita Sells, DO  doxazosin (CARDURA) 2 MG tablet Place 1 tablet (2 mg total) into feeding tube daily. 10/28/20   Mikhail, Nita Sells, DO  EPINEPHrine 0.3 mg/0.3 mL IJ SOAJ injection Inject 0.3 mg into the muscle once as needed for anaphylaxis. 02/07/20   [provider]  feeding supplement (ENSURE ENLIVE / ENSURE PLUS) LIQD Take 237 mLs by mouth 2 (two) times daily between meals. 10/28/20   Mikhail, Nita Sells, DO  guaiFENesin-dextromethorphan (ROBITUSSIN DM) 100-10 MG/5ML syrup Place 5 mLs into feeding tube every 4 (four) hours as needed for cough. 10/28/20   Mikhail, Nita Sells, DO  insulin aspart (NOVOLOG) 100 UNIT/ML injection Inject 0-20 Units into the skin every 4 (four) hours. 10/28/20   Mikhail, Nita Sells, DO  insulin glargine (LANTUS) 100 UNIT/ML injection Inject 0.06 mLs (6 Units total) into the skin daily. 10/28/20   Mikhail, Nita Sells, DO  Nutritional Supplements (FEEDING SUPPLEMENT, PROSOURCE TF,) liquid Place 45 mLs into feeding tube 3 (three) times daily.  10/28/20   Mikhail, Nita Sells, DO  Nutritional Supplements (FEEDING SUPPLEMENT, VITAL 1.5 CAL,) LIQD Place 960 mLs into feeding tube daily. 10/28/20   Mikhail, Nita Sells, DO  oxyCODONE (OXY IR/ROXICODONE) 5 MG immediate release tablet Place 1 tablet (5 mg total) into feeding tube every 6 (six) hours as needed (RASS goal 0). 10/28/20   Edsel Petrin, DO  QUEtiapine (SEROQUEL) 25 MG tablet Take 0.5 tablets (12.5 mg total) by mouth at bedtime. 10/28/20   Edsel Petrin, DO  Water For Irrigation, Sterile (FREE WATER) SOLN Place 200 mLs into feeding tube every 4 (four) hours. 10/28/20   Edsel Petrin, DO     Vital Signs: BP 119/74 (BP Location: Left Arm)   Pulse 90   Temp 98.7 F (37.1 C) (Oral)   Resp 16   Wt 200 lb 2.8 oz (90.8 kg)   SpO2 98%   BMI 25.70 kg/m   Physical Exam  Imaging: No results found.  Labs:  CBC: Recent Labs    10/23/20 0751 10/25/20 0212 10/27/20 0144 10/28/20 0226 10/29/20 0706  WBC 8.4 8.3 7.2  --  6.1  HGB 9.2* 9.0* 9.2* 8.3* 9.0*  HCT 29.6* 28.7* 29.2* 25.6* 28.3*  PLT 175 181 199  --  227    COAGS: Recent Labs    09/25/20 0309 10/25/20 1041  INR 1.0 1.1  APTT 27 34    BMP: Recent Labs    12/13/19 1155 03/02/20 0853 09/25/20 0309 10/03/20 1219 10/03/20 7588 10/27/20 0144 10/28/20 0226 10/28/20 1845 10/29/20 0706  NA 139 141   < >  QUESTIONABLE RESULTS, RECOMMEND RECOLLECT TO VERIFY   < > 132* 133* 134* 133*  K 4.3 4.1   < > QUESTIONABLE RESULTS, RECOMMEND RECOLLECT TO VERIFY   < > 4.1 4.0 4.0 4.0  CL 102 103   < > QUESTIONABLE RESULTS, RECOMMEND RECOLLECT TO VERIFY   < > 99 101 101 100  CO2 27 23   < > QUESTIONABLE RESULTS, RECOMMEND RECOLLECT TO VERIFY   < > 25 24 25 25   GLUCOSE 103* 115*   < > QUESTIONABLE RESULTS, RECOMMEND RECOLLECT TO VERIFY   < > 98 137* 107* 122*  BUN 13 13   < > QUESTIONABLE RESULTS, RECOMMEND RECOLLECT TO VERIFY   < > 13 12 8  7*  CALCIUM 9.8 9.4   < > QUESTIONABLE RESULTS, RECOMMEND RECOLLECT TO VERIFY   <  > 8.7* 8.4* 8.6* 8.6*  CREATININE 1.04 0.98   < > QUESTIONABLE RESULTS, RECOMMEND RECOLLECT TO VERIFY   < > 0.65 0.68 0.68 0.68  GFRNONAA >60 81   < > QUESTIONABLE RESULTS, RECOMMEND RECOLLECT TO VERIFY   < > >60 >60 >60 >60  GFRAA >60 94  --  QUESTIONABLE RESULTS, RECOMMEND RECOLLECT TO VERIFY  --   --   --   --   --    < > = values in this interval not displayed.    LIVER FUNCTION TESTS: Recent Labs    10/18/20 0734 10/22/20 0326 10/27/20 1053 10/29/20 0706  BILITOT 1.0 0.8 0.8 0.9  AST 61* 37 39 27  ALT 136* 79* 57* 45*  ALKPHOS 129* 116 95 84  PROT 6.3* 5.6* 5.7* 5.9*  ALBUMIN 2.8* 2.5* 2.9* 2.9*    Assessment and Plan: S/p Inferior venacavogram with mechanical thrombectomy with moderately large residual caval thrombus.  IVC was left in place and remains in good position. Patient remains stable on rehab.  Participating in therapies as expected.  On trach collar.  Receiving TF via NGT.  Wife at bedside.  Revisited planned procedure for additional caval thrombectomy. This has been scheduled for tomorrow.  All questions answered.  NPO p MN.   Consent signed and in chart.    Electronically Signed: 10/29/20, PA 11/01/2020, 2:11 PM   I spent a total of 15 Minutes at the the patient's bedside AND on the patient's hospital floor or unit, greater than 50% of which was counseling/coordinating care for occlusive caval thrombus.

## 2020-11-01 NOTE — Progress Notes (Signed)
Speech Language Pathology Daily Session Note  Patient Details  Name: Walter [name] MRN: [id number] Date of Birth: [birth year]  Today's Date: 11/01/2020 SLP Individual Time: 0900-0955 SLP Individual Time Calculation (min): 55 min  Short Term Goals: Week 1: SLP Short Term Goal 1 (Week 1): Patient will utilize an increased vocal intensity at the phrase leve to achieve ~50% intelligibility with Mod verbal cues. SLP Short Term Goal 2 (Week 1): Patient will consume current diet with minimal overt s/s of aspiration with Mod verbal cues for use of swallowing compensatory strategies. SLP Short Term Goal 3 (Week 1): Patient will demonstrate sustained attention to functional tasks for 15 minutes with Mod verbal cues for redirection. SLP Short Term Goal 4 (Week 1): Patient will utilize external aids and contextual cues for orientation with Mod multimodal cues. SLP Short Term Goal 5 (Week 1): Patient will demonstrate functional problem solving for basic and familiar tasks with Max A multimodal cues.  Skilled Therapeutic Interventions: Skilled treatment session focused on cognitive goals. Upon arrival, patient was upright in the wheelchair with PMSV in place (without staff or family present) with all vitals remained WFL. Patient demonstrated mildly improved vocal intensity with overall Mod A verbal cues to achieve ~75% intelligibility at the sentence level. Patient appeared lethargic but SLP administered the [facility] Mental Status Examination (SLUMS). Patient scored  15/30 points with a score of 27 or above considered normal. Patient demonstrated deficits in attention, problem solving and recall. SLP provided a patient with a calendar with large contrast to maximize recall and carryover of date. Patient requested to get back to bed at end of session and required Mod-Max A verbal cues for safety with task due to impulsivity. PMSV removed due to patient resting and PMSV attached to the trach flange via  the PMSV leash. Patient left supine in bed with alarm on and all needs within reach. Continue with current plan of care.       Pain No/Denies Pain   Therapy/Group: Individual Therapy  Joniah Bednarski 11/01/2020, 12:41 PM

## 2020-11-02 ENCOUNTER — Inpatient Hospital Stay (HOSPITAL_COMMUNITY): Payer: Medicare Other

## 2020-11-02 DIAGNOSIS — I610 Nontraumatic intracerebral hemorrhage in hemisphere, subcortical: Secondary | ICD-10-CM

## 2020-11-02 HISTORY — PX: IR PTA VENOUS EXCEPT DIALYSIS CIRCUIT: IMG6126

## 2020-11-02 HISTORY — PX: IR THROMBECT VENO MECH MOD SED: IMG2300

## 2020-11-02 HISTORY — PX: IR IVUS EACH ADDITIONAL NON CORONARY VESSEL: IMG6086

## 2020-11-02 HISTORY — PX: IR US GUIDE VASC ACCESS RIGHT: IMG2390

## 2020-11-02 HISTORY — PX: IR VENO/EXT/UNI RIGHT: IMG676

## 2020-11-02 LAB — GLUCOSE, CAPILLARY
Glucose-Capillary: 104 mg/dL — ABNORMAL HIGH (ref 70–99)
Glucose-Capillary: 106 mg/dL — ABNORMAL HIGH (ref 70–99)
Glucose-Capillary: 106 mg/dL — ABNORMAL HIGH (ref 70–99)
Glucose-Capillary: 114 mg/dL — ABNORMAL HIGH (ref 70–99)
Glucose-Capillary: 115 mg/dL — ABNORMAL HIGH (ref 70–99)
Glucose-Capillary: 140 mg/dL — ABNORMAL HIGH (ref 70–99)
Glucose-Capillary: 141 mg/dL — ABNORMAL HIGH (ref 70–99)

## 2020-11-02 LAB — POCT ACTIVATED CLOTTING TIME
Activated Clotting Time: 130 seconds
Activated Clotting Time: 130 seconds
Activated Clotting Time: 220 seconds
Activated Clotting Time: 249 seconds
Activated Clotting Time: 220 seconds

## 2020-11-02 MED ORDER — MIDAZOLAM HCL 2 MG/2ML IJ SOLN
INTRAMUSCULAR | Status: AC
  Filled 2020-11-02: qty 2

## 2020-11-02 MED ORDER — FENTANYL CITRATE (PF) 100 MCG/2ML IJ SOLN
INTRAMUSCULAR | Status: AC
  Filled 2020-11-02: qty 2

## 2020-11-02 MED ORDER — IOHEXOL 300 MG/ML  SOLN
150.0000 mL | Freq: Once | INTRAMUSCULAR | Status: AC | PRN
  Administered 2020-11-02: 75 mL via INTRAVENOUS

## 2020-11-02 MED ORDER — FENTANYL CITRATE (PF) 100 MCG/2ML IJ SOLN
INTRAMUSCULAR | Status: AC | PRN
  Administered 2020-11-02: 50 ug via INTRAVENOUS
  Administered 2020-11-02 (×6): 25 ug via INTRAVENOUS
  Administered 2020-11-02: 50 ug via INTRAVENOUS

## 2020-11-02 MED ORDER — SODIUM CHLORIDE 0.9 % IV SOLN
INTRAVENOUS | Status: AC | PRN
  Administered 2020-11-02: 10 mL/h via INTRAVENOUS

## 2020-11-02 MED ORDER — MIDAZOLAM HCL 2 MG/2ML IJ SOLN
INTRAMUSCULAR | Status: AC | PRN
  Administered 2020-11-02 (×2): 0.5 mg via INTRAVENOUS
  Administered 2020-11-02: 1 mg via INTRAVENOUS
  Administered 2020-11-02 (×3): 0.5 mg via INTRAVENOUS
  Administered 2020-11-02: 1 mg via INTRAVENOUS
  Administered 2020-11-02: 0.5 mg via INTRAVENOUS

## 2020-11-02 MED ORDER — HEPARIN SODIUM (PORCINE) 1000 UNIT/ML IJ SOLN
INTRAMUSCULAR | Status: AC | PRN
  Administered 2020-11-02: 5000 [IU] via INTRAVENOUS
  Administered 2020-11-02: 3000 [IU] via INTRAVENOUS
  Administered 2020-11-02: 5000 [IU] via INTRAVENOUS

## 2020-11-02 MED ORDER — HEPARIN SODIUM (PORCINE) 1000 UNIT/ML IJ SOLN
INTRAMUSCULAR | Status: AC
  Filled 2020-11-02: qty 1

## 2020-11-02 MED ORDER — LIDOCAINE HCL 1 % IJ SOLN
INTRAMUSCULAR | Status: AC
  Filled 2020-11-02: qty 20

## 2020-11-02 NOTE — Progress Notes (Signed)
Occupational Therapy Session Note  Patient Details  Name: Walter [name] MRN: [id number] Date of Birth: [birth year]  Today's Date: 11/02/2020 OT Individual Time: 1007-1105 OT Individual Time Calculation (min): 58 min    Short Term Goals: Week 1:  OT Short Term Goal 1 (Week 1): Pt will complete BSC or toilet transfer with 1 assist and LRAD OT Short Term Goal 2 (Week 1): Pt will engage in self care or therapeutic activity while sitting unsupported for ~20 minutes with no more than Mod balance assistance OT Short Term Goal 3 (Week 1): Pt will be able to visually scan for 1 ADL item and successfully reach for it with no more than Mod A  Skilled Therapeutic Interventions/Progress Updates:    Pt received semi-reclined in bed, denies pain throughout session, agreeable to therapy. SatO2 WNL on 4L via trach at 40%. Came sitting EOB with min A to facilitate log roll technique. Pt maintains static sitting balance with intermittent min A to prevent anterior/posterior LOB. Doffed/donned shirt with min A to pull over head/manage trach. Donned pants with mod A - pt able to thread BLE with min A for balance, but req assist to pull over B hips in standing with + 2 for safety. Stand-pivot transfer to TIS with mod A + 2.  Seated at sink, attempted to brush hair, but req mod A for thoroughness. Req to shave with electric razor - with light HOH, pt able to use R hand to trim beard. Req A for thoroughness of task later 2/2 fatigue. Pt then completed 2x10 of the following with 2 lb dowel rod for RUE NMR: forward/backward rows, chest press, and shoulder press. Demonstrates great improvement in BUE coordination. Req to use bathroom, stedy transfer to toilet 2/2 urgency and no + 2. CGA to power up in stedy and total A for LB clothing management.  Hand-off to NT to take over care. Pt left on toilet with NT/wife present.    Therapy Documentation Precautions:  Precautions Precautions: Fall,Other (comment) Precaution  Comments: NG tube, trach, PMSV, R hemiparesis, hx of DVTs and PEs Restrictions Weight Bearing Restrictions: No Pain:   denies ADL: See Care Tool for more details.  Therapy/Group: Individual Therapy  Claudie Revering MS, OTR/L  11/02/2020, 12:10 PM

## 2020-11-02 NOTE — Progress Notes (Signed)
NAME:  Walter [name], MRN:  [id number], DOB:  [birth year], LOS: 5 ADMISSION DATE:  10/28/2020, CONSULTATION DATE:  5/2 REFERRING MD:  Kisteins , CHIEF COMPLAINT:  Trach management    History of Present Illness:  This is a 65 year old male who was initially admitted to [facility] On 3/28 after presenting for a code stroke with initial symptoms of headache and right-sided weakness.  Admitted for intraparenchymal hemorrhage involving the left pons with subarachnoid extension.  Course complicated by ineffective airway clearance requiring intubation, cerebral edema, and intraventricular hemorrhage.  Progress in ICU was slow, course complicated by failed attempt at extubation requiring tracheostomy placement on 4/6.  Also Exidine of bacterial bacteremia, significant deconditioning, and bilateral DVT requiring IVC placement.  Ultimately liberated from mechanical ventilation, and on 4/22 moved out of the intensive care.  She was supported on the medical ward from the 22nd up until 4/29 at which time she was transferred to acute rehab.  Pulmonary asked to follow for tracheostomy management.  Pertinent  Medical History  Atrial fibrillation with prior ablation not on anticoagulation. Acute intraparenchymal hemorrhage involving the dorsal left pons with subarachnoid extension.  Complicated by decreased functional ability and right-sided hemiparesis with cognitive deficits Grade 1 diastolic dysfunction Lower ext DVT c/b obstructed/clotted IVC filter req mechanical thrombectomy and removal of IVC filter.  Prolonged mechanical ventilation with ineffective airway clearance requiring tracheostomy  Significant Hospital Events: Including procedures, antibiotic start and stop dates in addition to other pertinent events   . 3/28 admitted with intraparenchymal hemorrhage required intubation  3/29- EVD placement for developing hydrocephalus. CTA of the head was unremarkable  3/30- Extubated.  4/1  respiratory culture grew Moraxella, on ceftriaxone  4/4 reintubated because of hypoxia and unresponsiveness  4/6 tracheostomy  4/7 still encephalopathic   4/8 remains encephalopathic  4/10 Doppler shows bilateral DVT. IVC filter placed.   4/11 Blood cultures positive for Acinetobacter   4/12 Fever no worse off [place], off sedative infusions, tolerating trach collar.   4/13 Febrile @ 7am  4/14 - 4/17 4/16 Afebrile Intermittently following simple commands and participated in PT, drowsy today  4/18 febrile. Starting unasyn, sending trach asp  4/19 T Max 101.7, WBC 12.2, ALT elevated, profoundly deconditioned  4/20 awake and following commands. Trach aspirate w few GPC.   4/21 Tolerated trach collar >24h  4/22 D1 diet, transfer out of ICU 4/27 increased LE edema had Venogram showed =Positive for thrombus involving the IVC and iliac veins. Large amount of thrombus involving the infrarenal IVC and IVC filter.Suprarenal IVC is patent. Thrombus in the right iliac veins and thrombus in the proximal femoral veins bilaterally  4/29 Underwent mechanical thrombectomy,removal of IVC filter.  trach changed to cuffless.  to rehab   5/3 Pulm consulted for trach   5/5 for repeat trip to IR for further thrombectomy of residual thrombus in IVC   Interim History / Subjective:  No distress from tracheostomy standpoint.  He is awaiting trip down to interventional radiology Objective   Blood pressure 126/76, pulse 81, temperature 98 F (36.7 C), resp. rate 18, weight 90.8 kg, SpO2 98 %.    FiO2 (%):  [28 %] 28 %   Intake/Output Summary (Last 24 hours) at 11/02/2020 0751 Last data filed at 11/01/2020 1758 Gross per 24 hour  Intake 960 ml  Output no documentation  Net 960 ml   Filed Weights   10/30/20 0500 10/31/20 0406 11/01/20 0500  Weight: 93.8 kg 90.6 kg 90.8 kg  Examination: General 65 year old male patient resting in bed he is in no acute distress this morning HEENT  normocephalic/atraumatic size 6 cuffless tracheostomy is unremarkable he is tolerating PMV well he says he has intermittent trouble with timing of saliva, his speech quality remains quite soft Pulmonary: Clear to auscultation currently on humidified room air Cardiac: Regular rhythm Abdomen: Soft not tender Extremities: Warm and dry Neuro: Awake oriented right side a little bit weaker.  Labs/imaging that I havepersonally reviewed  (right click and "Reselect all SmartList Selections" daily)  See below  Resolved Hospital Problem list     Assessment & Plan:  Trach dependence secondary to ineffective airway clearance  S/p Intraparenchymal hemorrhage w/ right sided hemiplegia Bilateral DVT Deconditioning  Htn H/o afib H/o urinary retention Macrocytic anemia   Pulm problem list  Trach dependence   Discussion Doing well from a tracheostomy standpoint.  Still not requiring suction.  I continue to think endurance will be his biggest issue, and then also need to be sure he does not have sleep apnea or ensure he is not having significant hypoxic events at night we can do this once he is ready to initiate capping trials Plan Continue humidified room air  PMV during daytime hours  Remove PMV at night  Continue rehabilitation focus  We will continue to follow weekly hopefully we can initiate capping trials soon  .    Best practice (right click and "Reselect all SmartList Selections" daily)  Per primary   Simonne Martinet ACNP-BC [facility] Pulmonary/Critical Care Pager # [phone] OR # [phone] if no answer

## 2020-11-02 NOTE — Progress Notes (Signed)
Physical Therapy Session Note  Patient Details  Name: RAYMONE PEMBROKE MRN: [id number] Date of Birth: 1955/11/24   Today's Date: 11/02/2020     Short Term Goals: Week 1:  PT Short Term Goal 1 (Week 1): Pt will consistently perform supine<>sit with mod assist PT Short Term Goal 2 (Week 1): Pt will perform sit<>stands using LRAD with +2 mod assist PT Short Term Goal 3 (Week 1): Pt will perform bed<>chair transfers using LRAD with +2 mod assist PT Short Term Goal 4 (Week 1): Pt will tolerate sitting upright, OOB in TIS w/c for at least 2 hours outside of therapy sessions PT Short Term Goal 5 (Week 1): Pt will ambulate at least 91ft using LRAD with +2 mod assist  Skilled Therapeutic Interventions/Progress Updates:   Pt off unit for Caval thrombectomy. Will re-attempt at later time/date, provided pt is medically appropriate for therapies       Therapy Documentation Precautions:  Precautions Precautions: Fall,Other (comment) Precaution Comments: NG tube, trach, PMSV, R hemiparesis, hx of DVTs and PEs Restrictions Weight Bearing Restrictions: No General: PT Amount of Missed Time (min): 75 Minutes PT Missed Treatment Reason: CT/MRI;Other (Comment) (off unit for thrombectomy)    Therapy/Group: Individual Therapy  Golden Pop 11/02/2020, 1:25 PM

## 2020-11-02 NOTE — Progress Notes (Signed)
PROGRESS NOTE   Subjective/Complaints:  No issues overnight , discussed upcoming procedure pt asked if he was getting propafol like Donn Pierini.   ROS- neg pain or SOB  Objective:   No results found. No results for input(s): WBC, HGB, HCT, PLT in the last 72 hours. No results for input(s): NA, K, CL, CO2, GLUCOSE, BUN, CREATININE, CALCIUM in the last 72 hours.  Intake/Output Summary (Last 24 hours) at 11/02/2020 0739 Last data filed at 11/01/2020 1758 Gross per 24 hour  Intake 960 ml  Output --  Net 960 ml     Pressure Injury 10/21/20 Head Left;Posterior Stage 1 -  Intact skin with non-blanchable redness of a localized area usually over a bony prominence. Pink non-blanchable scalp with thinning hair on posterior left side of head (Active)  10/21/20 1100  Location: Head  Location Orientation: Left;Posterior  Staging: Stage 1 -  Intact skin with non-blanchable redness of a localized area usually over a bony prominence.  Wound Description (Comments): Pink non-blanchable scalp with thinning hair on posterior left side of head  Present on Admission: No    Physical Exam: Vital Signs Blood pressure 126/76, pulse 81, temperature 98 F (36.7 C), resp. rate 18, weight 90.8 kg, SpO2 98 %.  ENT- trach #6 Shiley, Cortrak tube  General: No acute distress Mood and affect are appropriate Heart: Regular rate and rhythm no rubs murmurs or extra sounds Lungs: Clear to auscultation, breathing unlabored, no rales or wheezes Abdomen: Positive bowel sounds, soft nontender to palpation, nondistended Extremities: No clubbing, cyanosis, or edema Skin: No evidence of breakdown, no evidence of rash Neurologic: Cranial nerves II through XII intact, motor strength is 4/5 in bilateral deltoid, bicep, tricep, grip, hip flexor, knee extensors, ankle dorsiflexor and plantar flexor Sensory exam normal sensation to light touch and proprioception in  bilateral upper and lower extremities Cerebellar exam normal finger to nose to finger as well as heel to shin in bilateral upper and lower extremities Musculoskeletal: Full range of motion in all 4 extremities. No joint swelling   Assessment/Plan: 1. Functional deficits which require 3+ hours per day of interdisciplinary therapy in a comprehensive inpatient rehab setting.  Physiatrist is providing close team supervision and 24 hour management of active medical problems listed below.  Physiatrist and rehab team continue to assess barriers to discharge/monitor patient progress toward functional and medical goals  Care Tool:  Bathing    Body parts bathed by patient: Right arm,Left arm,Chest,Abdomen,Front perineal area,Face   Body parts bathed by helper: Buttocks,Right upper leg,Left upper leg,Right lower leg,Left lower leg     Bathing assist Assist Level: 2 Helpers     Upper Body Dressing/Undressing Upper body dressing   What is the patient wearing?: Pull over shirt    Upper body assist Assist Level: Minimal Assistance - Patient > 75%    Lower Body Dressing/Undressing Lower body dressing      What is the patient wearing?: Pants,Incontinence brief     Lower body assist Assist for lower body dressing: Total Assistance - Patient < 25%     Toileting Toileting    Toileting assist Assist for toileting: Total Assistance - Patient < 25%  Transfers Chair/bed transfer  Transfers assist  Chair/bed transfer activity did not occur: Safety/medical concerns  Chair/bed transfer assist level: 2 Helpers     Locomotion Ambulation   Ambulation assist   Ambulation activity did not occur: Safety/medical concerns          Walk 10 feet activity   Assist  Walk 10 feet activity did not occur: Safety/medical concerns        Walk 50 feet activity   Assist Walk 50 feet with 2 turns activity did not occur: Safety/medical concerns         Walk 150 feet  activity   Assist Walk 150 feet activity did not occur: Safety/medical concerns         Walk 10 feet on uneven surface  activity   Assist Walk 10 feet on uneven surfaces activity did not occur: Safety/medical concerns         Wheelchair     Assist Will patient use wheelchair at discharge?:  (TBD)             Wheelchair 50 feet with 2 turns activity    Assist            Wheelchair 150 feet activity     Assist          Blood pressure 126/76, pulse 81, temperature 98 F (36.7 C), resp. rate 18, weight 90.8 kg, SpO2 98 %.  Medical Problem List and Plan: 1. Right side weakness and slurred speech secondary to intracerebral hemorrhage complicated by hydrocephalus. Status post EVD placed per neurosurgery -patient may not shower -ELOS/Goals: 5/26 -cont PT. ,OT 2. Acute PE/bilateral lower extremity DVT: Status post IVC filter 10/08/2020 with thrombectomy and retrieval of IVC filter 10/28/2020 per interventional radiology (still has suprarenal filter ) No RLE, scheduled for repeat thrombectomy this week -DVT/anticoagulation: Continue Eliquis- unless this needs to be stopped for IR procedure  -antiplatelet therapy: N/A 3. Pain Management: Decrease oxycodone to q12H prn 4. Mood: Amantadine 200 mg twice daily, may need to reduce if pt becomes agitated  -antipsychotic agents: N/A 5. Neuropsych: This patient is not capable of making decisions on his own behalf. 6. Skin/Wound Care: Routine skin checks 7. Fluids/Electrolytes/Nutrition: Routine in and outs with follow-up chemistries 8. PAF. Status post ablation cardioversion in the Past. Continue Eliquis. Cardio exam 30 mg every 6 hours. Cardiac rate controlled 9. Acute hypoxic respiratory failure. Tracheostomy 10/04/2020 per DrChand. Presently with a #6 Shiley. PMV as tolerated. Follow-up speech therapy. Daily weights. 10.  Hospital-acquired pneumonia/ventilator associated pneumonia. Infectious disease follow-up antibiotic therapy completed 11. Dysphagia. Dysphagia #3 thin liquids.  Will need assistance with eating, reduce TF volume to improve appetite 12. Hyperglycemia related to tube feeds. Lantus insulin 6 units daily. Continue SSI. Hemoglobin A1c was 5.7    LOS: 5 days A FACE TO FACE EVALUATION WAS PERFORMED  Erick Colace 11/02/2020, 7:39 AM

## 2020-11-02 NOTE — Progress Notes (Signed)
Speech Language Pathology Daily Session Note  Patient Details  Name: Walter [name] MRN: [id number] Date of Birth: 1955-12-19  Today's Date: 11/02/2020 SLP Individual Time: 0800-0855 SLP Individual Time Calculation (min): 55 min  Short Term Goals: Week 1: SLP Short Term Goal 1 (Week 1): Patient will utilize an increased vocal intensity at the phrase leve to achieve ~50% intelligibility with Mod verbal cues. SLP Short Term Goal 2 (Week 1): Patient will consume current diet with minimal overt s/s of aspiration with Mod verbal cues for use of swallowing compensatory strategies. SLP Short Term Goal 3 (Week 1): Patient will demonstrate sustained attention to functional tasks for 15 minutes with Mod verbal cues for redirection. SLP Short Term Goal 4 (Week 1): Patient will utilize external aids and contextual cues for orientation with Mod multimodal cues. SLP Short Term Goal 5 (Week 1): Patient will demonstrate functional problem solving for basic and familiar tasks with Max A multimodal cues.  Skilled Therapeutic Interventions: Skilled treatment session focused on speech and cognitive goals. Upon arrival, patient was asleep and required extra time for arousal. PMSV was donned and all vitals remained [facility] throughout session. Patient is currently NPO for a procedure but patient requested to perform oral care due to xerostomia. Patient utilized the suction toothbrush with overall Min verbal cues. Patient appeared anxious about his procedure today and requested to call his wife. Patient attempted to dial her number 3 times but demonstrated difficulty due to deficits in coordination and vision. SLP eventually dialed the number and patient required Mod-Max A verbal cues for use of diaphragmatic breathing to achieve ~50-75% intelligibility at the sentence level. Patient with increased ability to utilize diaphragmatic breathing during structured speech tasks but requires Max verbal cues for carryover to functional  speech. Patient left upright in bed with alarm on, all needs within reach and PMSV removed. Continue with current plan of care.      Pain Pain Assessment Pain Scale: 0-10 Pain Score: 0-No pain  Therapy/Group: Individual Therapy  Shasta Chinn 11/02/2020, 1:05 PM

## 2020-11-02 NOTE — Procedures (Signed)
Interventional Radiology Procedure Note  Procedure:  1) Right lower extremity venogram 2) Intravascular ultrasound 3) Right cavo-ilio-femoral venous mechanical thrombectomy 4) Right femoral and common femoral balloon angioplasty  Findings: Please refer to procedural dictation for full description.  Right P3 popliteal vein access with 16 Fr sheath, right IJ access with 16 Fr sheath.  Persistent caval thrombus extending into IVC filter with high grade stenosis.  Acute on chronic thrombus within the common femoral and femoral veins.  Successful thrombectomy and debulking of clot burden.  Significantly improved patency.    Complications: None immediate  Estimated Blood Loss: 50 mL  Recommendations: Head of bed >30 degrees for 2 hours.   Wound care to right popliteal fossa, right neck. Continue anticoagulation. Recommend bilateral thigh high compression hose and SCDs at all times unless ambulating. IR will follow.     Marliss Coots, MD Pager: [phone]

## 2020-11-03 LAB — GLUCOSE, CAPILLARY
Glucose-Capillary: 123 mg/dL — ABNORMAL HIGH (ref 70–99)
Glucose-Capillary: 156 mg/dL — ABNORMAL HIGH (ref 70–99)
Glucose-Capillary: 113 mg/dL — ABNORMAL HIGH (ref 70–99)
Glucose-Capillary: 152 mg/dL — ABNORMAL HIGH (ref 70–99)
Glucose-Capillary: 114 mg/dL — ABNORMAL HIGH (ref 70–99)

## 2020-11-03 MED ORDER — ENSURE ENLIVE PO LIQD
237.0000 mL | Freq: Three times a day (TID) | ORAL | Status: DC
  Administered 2020-11-03 – 2020-11-22 (×54): 237 mL via ORAL

## 2020-11-03 NOTE — Progress Notes (Signed)
PROGRESS NOTE   Subjective/Complaints:  Pt without problems this am except Right foot pain  ROS- neg pain or SOB  Objective:   No results found. No results for input(s): WBC, HGB, HCT, PLT in the last 72 hours. No results for input(s): NA, K, CL, CO2, GLUCOSE, BUN, CREATININE, CALCIUM in the last 72 hours.  Intake/Output Summary (Last 24 hours) at 11/03/2020 0807 Last data filed at 11/03/2020 0757 Gross per 24 hour  Intake 309 ml  Output 350 ml  Net -41 ml     Pressure Injury 10/21/20 Head Left;Posterior Stage 1 -  Intact skin with non-blanchable redness of a localized area usually over a bony prominence. Pink non-blanchable scalp with thinning hair on posterior left side of head (Active)  10/21/20 1100  Location: Head  Location Orientation: Left;Posterior  Staging: Stage 1 -  Intact skin with non-blanchable redness of a localized area usually over a bony prominence.  Wound Description (Comments): Pink non-blanchable scalp with thinning hair on posterior left side of head  Present on Admission: No    Physical Exam: Vital Signs Blood pressure 123/62, pulse 88, temperature 98.2 F (36.8 C), resp. rate 20, weight 87.7 kg, SpO2 98 %.  ENT- trach #6 Shiley, Cortrak tube   General: No acute distress Mood and affect are appropriate Heart: Regular rate and rhythm no rubs murmurs or extra sounds Lungs: Clear to auscultation, breathing unlabored, no rales or wheezes Abdomen: Positive bowel sounds, soft nontender to palpation, nondistended Extremities: No clubbing, cyanosis, or edema, nl dorsalis pedis pulses Skin: No evidence of breakdown, no evidence of rash, right popliteal pressure pressure    Neurologic: , motor strength is 4/5 in bilateral deltoid, bicep, tricep, grip, hip flexor, knee extensors, ankle dorsiflexor and plantar flexor Sensory exam normal sensation to light touch and proprioception in bilateral upper and  lower extremities Cerebellar exam dysmetria Left FNF     Musculoskeletal: Full range of motion in all 4 extremities. No joint swelling, no joint effusion R foot, No pain with ROM    Assessment/Plan: 1. Functional deficits which require 3+ hours per day of interdisciplinary therapy in a comprehensive inpatient rehab setting.  Physiatrist is providing close team supervision and 24 hour management of active medical problems listed below.  Physiatrist and rehab team continue to assess barriers to discharge/monitor patient progress toward functional and medical goals  Care Tool:  Bathing    Body parts bathed by patient: Right arm,Left arm,Chest,Abdomen,Front perineal area,Face   Body parts bathed by helper: Buttocks,Right upper leg,Left upper leg,Right lower leg,Left lower leg     Bathing assist Assist Level: 2 Helpers     Upper Body Dressing/Undressing Upper body dressing   What is the patient wearing?: Pull over shirt    Upper body assist Assist Level: Minimal Assistance - Patient > 75%    Lower Body Dressing/Undressing Lower body dressing      What is the patient wearing?: Pants     Lower body assist Assist for lower body dressing: Moderate Assistance - Patient 50 - 74%     Toileting Toileting    Toileting assist Assist for toileting: Total Assistance - Patient < 25%  Transfers Chair/bed transfer  Transfers assist  Chair/bed transfer activity did not occur: Safety/medical concerns  Chair/bed transfer assist level: 2 Helpers     Locomotion Ambulation   Ambulation assist   Ambulation activity did not occur: Safety/medical concerns          Walk 10 feet activity   Assist  Walk 10 feet activity did not occur: Safety/medical concerns        Walk 50 feet activity   Assist Walk 50 feet with 2 turns activity did not occur: Safety/medical concerns         Walk 150 feet activity   Assist Walk 150 feet activity did not occur: Safety/medical  concerns         Walk 10 feet on uneven surface  activity   Assist Walk 10 feet on uneven surfaces activity did not occur: Safety/medical concerns         Wheelchair     Assist Will patient use wheelchair at discharge?:  (TBD)             Wheelchair 50 feet with 2 turns activity    Assist            Wheelchair 150 feet activity     Assist          Blood pressure 123/62, pulse 88, temperature 98.2 F (36.8 C), resp. rate 20, weight 87.7 kg, SpO2 98 %.  Medical Problem List and Plan: 1. Right side weakness and slurred speech secondary to intracerebral hemorrhage complicated by hydrocephalus. Status post EVD placed per neurosurgery -patient may not shower -ELOS/Goals: 5/26 -cont PT. ,OT 2. Acute PE/bilateral lower extremity DVT: Status post IVC filter 10/08/2020 with thrombectomy and retrieval of IVC filter 10/28/2020 per interventional radiology (still has suprarenal filter ) No RLE, scheduled for repeat thrombectomy this week -DVT/anticoagulation: Continue Eliquis- unless this needs to be stopped for IR procedure  -antiplatelet therapy: N/A 3. Pain Management: Decrease oxycodone to q12H prn 4. Mood: Amantadine 200 mg twice daily, may need to reduce if pt becomes agitated  -antipsychotic agents: N/A 5. Neuropsych: This patient is not capable of making decisions on his own behalf. 6. Skin/Wound Care: Routine skin checks 7. Fluids/Electrolytes/Nutrition: Routine in and outs with follow-up chemistries 8. PAF. Status post ablation cardioversion in the Past. Continue Eliquis. Cardio exam 30 mg every 6 hours. Cardiac rate controlled 9. Acute hypoxic respiratory failure. Tracheostomy 10/04/2020 per DrChand. Presently with a #6 Shiley. PMV as tolerated. Follow-up speech therapy. Daily weights. 10. Hospital-acquired pneumonia/ventilator associated pneumonia. Infectious disease  follow-up antibiotic therapy completed 11. Dysphagia. Dysphagia #3 thin liquids.  Will need assistance with eating, reduce TF volume to improve appetite 12. Hyperglycemia related to tube feeds. Lantus insulin 6 units daily. Continue SSI. Hemoglobin A1c was 5.7  13.  RIght foot pain ? Tibial nerve irritation post procedure circulation is good, no MSK exam abnl  LOS: 6 days A FACE TO FACE EVALUATION WAS PERFORMED  Walter [name] 11/03/2020, 8:07 AM

## 2020-11-03 NOTE — Progress Notes (Signed)
Occupational Therapy Session Note  Patient Details  Name: Walter [name] MRN: [id number] Date of Birth: 06/16/1956  Today's Date: 11/03/2020 OT Individual Time: 1304-1403 OT Individual Time Calculation (min): 59 min    Short Term Goals: Week 1:  OT Short Term Goal 1 (Week 1): Pt will complete BSC or toilet transfer with 1 assist and LRAD OT Short Term Goal 2 (Week 1): Pt will engage in self care or therapeutic activity while sitting unsupported for ~20 minutes with no more than Mod balance assistance OT Short Term Goal 3 (Week 1): Pt will be able to visually scan for 1 ADL item and successfully reach for it with no more than Mod A  Skilled Therapeutic Interventions/Progress Updates:    Pt received semi-reclined in bed finishing lunch with NT present, agreeable to therapy. Denies pain throughout session. Came to sitting EOB with min A to lift trunk via log roll. Doffed shirt with mod A to pull over head/LUE, noted difficulty maintaining static sitting balance requiring + 2 assist. Stand-pivot to and from bed mod A + 2 throughout session. Seated on mat, focus on dynamic sitting balance and functional B grasp. Pt received and placed resistive clothes pins from outside BOS. Req overall mod A to achieve grasp on pins and to place on horizontal bar. Freq VCS to maintain upright posture, but noted improved in returning to midline. Pt reports double vision impacting ability to accurately place pins. Reports improvement in double vision with one eye occluded, but pt declining to try eye patch/taping glasses this date. Stood to [employer] with overall mod A + 2 + unilateral BUE support. Returned to supine in bed with min A to progress BLE onto bed. Doffed PMV. SatO2 WNL at 4-5 L throughout session via trach.   Pt left with HOB elevated, telesitter on, bed alarm engaged, call bell in reach, and all immediate needs met.    Therapy Documentation Precautions:  Precautions Precautions: Fall,Other  (comment) Precaution Comments: NG tube, trach, PMSV, R hemiparesis, hx of DVTs and PEs Restrictions Weight Bearing Restrictions: No Pain:   denies ADL: See Care Tool for more details.   Therapy/Group: Individual Therapy  [name] MS, OTR/L  11/03/2020, 6:44 AM

## 2020-11-03 NOTE — Progress Notes (Signed)
Speech Language Pathology Daily Session Note  Patient Details  Name: DEO MEHRINGER MRN: [id number] Date of Birth: December 12, 1955  Today's Date: 11/03/2020 SLP Individual Time: 0815-0855 SLP Individual Time Calculation (min): 40 min  Short Term Goals: Week 1: SLP Short Term Goal 1 (Week 1): Patient will utilize an increased vocal intensity at the phrase leve to achieve ~50% intelligibility with Mod verbal cues. SLP Short Term Goal 2 (Week 1): Patient will consume current diet with minimal overt s/s of aspiration with Mod verbal cues for use of swallowing compensatory strategies. SLP Short Term Goal 3 (Week 1): Patient will demonstrate sustained attention to functional tasks for 15 minutes with Mod verbal cues for redirection. SLP Short Term Goal 4 (Week 1): Patient will utilize external aids and contextual cues for orientation with Mod multimodal cues. SLP Short Term Goal 5 (Week 1): Patient will demonstrate functional problem solving for basic and familiar tasks with Max A multimodal cues.  Skilled Therapeutic Interventions:  Skilled treatment session focused on speech and cognitive goals. Upon arrival, patient was asleep but easily awakened. PMSV was donned and patient had been incontinent of urine requiring a linen change. SLP facilitated session by providing extra time for patient to follow commands for bed mobility. Patient required Mod A verbal cues for use of an increased vocal intensity at the sentence level and demonstrated intermittent language of confusion with perseveration on going home within the next 2 weeks. Patient also reported constant itching on his back, appeared to be a rash, RN aware. Patient left upright in bed, alarm on and PMSV removed. Continue with current plan of care.      Pain No/Denies Pain   Therapy/Group: Individual Therapy  Ladena Jacquez 11/03/2020, 12:27 PM

## 2020-11-03 NOTE — Progress Notes (Signed)
Nutrition Follow-up  DOCUMENTATION CODES:   Non-severe (moderate) malnutrition in context of acute illness/injury  INTERVENTION:   Continue nocturnal tube feeds via Cortrak tube: - Osmolite 1.5 @ 40 ml/hr x 12 hours from 1800 to 0600 - ProSource TF 45 ml TID - Free water flushes of 100 ml q 6 hours  Tube feeding regimen provides 840 kcal, 63 grams of protein, and 366 ml of H2O (meets 37% of kcal needs and 55% of protein needs).  Total free water with flushes: 766 ml  - Encourage adequate PO intake  - Increase Ensure Enlive po to TID, each supplement provides 350 kcal and 20 grams of protein  - Magic Cup BID with meals, each supplement provides 290 kcal and 9 grams of protein  NUTRITION DIAGNOSIS:   Moderate Malnutrition related to acute illness (intraparencymal hemorrhage) as evidenced by mild fat depletion,moderate muscle depletion,percent weight loss (7.6% weight loss in less than 3 months).  Ongoing, being addressed via supplements and TF  GOAL:   Patient will meet greater than or equal to 90% of their needs  Progressing  MONITOR:   PO intake,Supplement acceptance,TF tolerance,Weight trends  REASON FOR ASSESSMENT:   Consult Enteral/tube feeding initiation and management  ASSESSMENT:   65 year old male with PMH of atrial fibrillation with ablation. Presented 09/17/20 with right-sided weakness, headache, dysarthria, and vomiting. Cranial CT/MRI showed acute intraparenchymal hemorrhage. Pt required emergent intubation for airway protection. EVD was placed per neurosurgery. Pt underwent tracheostomy 10/04/20. Pt initially NPO and is now on a dysphagia 3 diet with thin liquids. Cortrak tube in place for nutrition support.  5/05 - s/p right lower extremity venogram, intravascular ultrasound, right cavo-ilio-femoral venous mechanical thrombectomy, right femoral and common femoral balloon angioplasty  Noted target d/c date of 11/23/20.  Pt remains on nocturnal tube  feeds. MD decreased rate to stimulate appetite and promote PO intake during the day.  Admit weight: 92.7 kg Current weight: 87.7 kg  Given 5 kg weight loss since admission (5.4% weight loss), recommend continuing nocturnal tube feeds at this time.  Attempted to speak with pt at bedside. However, pt sleeping soundly and did not awaken to RD voice. Discussed pt with LPN who reports pt was NPO most of the day yesterday for IR procedure. LPN reports pt ate very well at breakfast today and does drink some of the Ensure supplements. Will increase Ensure Enlive from BID to TID.  Current TF: Osmolite 1.5 @ 40 ml/hr x 12 hours from 1800 to 0600, ProSource TF 45 ml TID, free water 100 ml q 6 hours  Meal Completion: 25-100% x last 8 meals  Medications reviewed and include: Ensure Enlive BID, SSI 4 hours, lantus 6 units daily  Labs from 5/01 reviewed: sodium 133, hemoglobin 9.0 CBG's: 104-156 x 24 hours  Diet Order:   Diet Order            DIET DYS 3 Room service appropriate? Yes; Fluid consistency: Thin  Diet effective now                 EDUCATION NEEDS:   No education needs have been identified at this time  Skin:  Skin Assessment: Skin Integrity Issues: Stage I: left head Incisions: neck Other: MASD rectum, puncture to left groin, right knee, right neck  Last BM:  11/02/20  Height:   Ht Readings from Last 1 Encounters:  09/25/20 6\' 2"  (1.88 m)    Weight:   Wt Readings from Last 1 Encounters:  11/03/20 87.7 kg  BMI:  Body mass index is 24.82 kg/m.  Estimated Nutritional Needs:   Kcal:  2300-2500  Protein:  115-130 grams  Fluid:  > 2.0 L    Mertie Clause, MS, RD, LDN Inpatient Clinical Dietitian Please see [facility]iON for contact information.

## 2020-11-03 NOTE — Progress Notes (Signed)
Referring Physician(s): Edsel Petrin  Supervising Physician: Marliss Coots  Patient Status:  [facility] - In-pt  Chief Complaint:  S/p Right lower extremity venogram, IVCgram and mechanical thrombectomy of inferior vena cava, right internal and external iliac, common femoral, and femoral veins, balloon angioplasty of the right femoral and common femoral veins on 5/5 with Dr. Elby Showers  Subjective:  Patient laying bed, not in acute distress. His wife is at the bedside.  Patient was not on compression devices, informed RN to place patient in thigh high compression hose and SCDs at all times unless ambulating per Dr. Jerrye Noble recommendation.  Patient has no complaints at the moment.    Allergies: Keppra [levetiracetam] and Latex  Medications: Prior to Admission medications   Medication Sig Start Date End Date Taking? Authorizing Provider  amantadine (SYMMETREL) 50 MG/5ML solution Place 20 mLs (200 mg total) into feeding tube 2 (two) times daily. 10/28/20   Mikhail, Nita Sells, DO  apixaban (ELIQUIS) 5 MG TABS tablet Place 1 tablet (5 mg total) into feeding tube 2 (two) times daily. 10/28/20   Edsel Petrin, DO  cholecalciferol (VITAMIN D3) 25 MCG (1000 UNIT) tablet Take 1,000 Units by mouth daily.    [provider]  diltiazem (CARDIZEM) 10 mg/ml oral suspension Place 3 mLs (30 mg total) into feeding tube every 6 (six) hours. 10/28/20   Mikhail, Nita Sells, DO  doxazosin (CARDURA) 2 MG tablet Place 1 tablet (2 mg total) into feeding tube daily. 10/28/20   Mikhail, Nita Sells, DO  EPINEPHrine 0.3 mg/0.3 mL IJ SOAJ injection Inject 0.3 mg into the muscle once as needed for anaphylaxis. 02/07/20   [provider]  feeding supplement (ENSURE ENLIVE / ENSURE PLUS) LIQD Take 237 mLs by mouth 2 (two) times daily between meals. 10/28/20   Mikhail, Nita Sells, DO  guaiFENesin-dextromethorphan (ROBITUSSIN DM) 100-10 MG/5ML syrup Place 5 mLs into feeding tube every 4 (four) hours as needed for  cough. 10/28/20   Mikhail, Nita Sells, DO  insulin aspart (NOVOLOG) 100 UNIT/ML injection Inject 0-20 Units into the skin every 4 (four) hours. 10/28/20   Mikhail, Nita Sells, DO  insulin glargine (LANTUS) 100 UNIT/ML injection Inject 0.06 mLs (6 Units total) into the skin daily. 10/28/20   Mikhail, Nita Sells, DO  Nutritional Supplements (FEEDING SUPPLEMENT, PROSOURCE TF,) liquid Place 45 mLs into feeding tube 3 (three) times daily. 10/28/20   Mikhail, Nita Sells, DO  Nutritional Supplements (FEEDING SUPPLEMENT, VITAL 1.5 CAL,) LIQD Place 960 mLs into feeding tube daily. 10/28/20   Mikhail, Nita Sells, DO  oxyCODONE (OXY IR/ROXICODONE) 5 MG immediate release tablet Place 1 tablet (5 mg total) into feeding tube every 6 (six) hours as needed (RASS goal 0). 10/28/20   Edsel Petrin, DO  QUEtiapine (SEROQUEL) 25 MG tablet Take 0.5 tablets (12.5 mg total) by mouth at bedtime. 10/28/20   Edsel Petrin, DO  Water For Irrigation, Sterile (FREE WATER) SOLN Place 200 mLs into feeding tube every 4 (four) hours. 10/28/20   Edsel Petrin, DO     Vital Signs: BP 123/62 (BP Location: Left Arm)   Pulse 86   Temp 98.2 F (36.8 C)   Resp 16   Wt 193 lb 5.5 oz (87.7 kg)   SpO2 96%   BMI 24.82 kg/m   Physical Exam Vitals reviewed.  Constitutional:      General: He is not in acute distress. HENT:     Head: Normocephalic and atraumatic.     Mouth/Throat:     Comments: Has trach. Positive for dressing on right IJ  puncture site. Site is unremarkable with no erythema, edema, tenderness, bleeding or drainage. Dressing had minimal amount of old blood otherwise clean, dry, and intact.   Cardiovascular:     Comments: DP palpable bilaterally  Pulmonary:     Effort: Pulmonary effort is normal.  Abdominal:     General: Abdomen is flat.     Palpations: Abdomen is soft.  Musculoskeletal:     Comments: Bilateral leg in normal appearance. No edema, no discoloration, no tenderness to palpation.   Positive for dressing on  the right popliteal puncture site. Site is unremarkable with no erythema, edema, tenderness, bleeding or drainage. Sutures intact. Minimal amount of old blood on the dressing. Dressing otherwise clean, dry, and intact.  Positive dressing on left femoral puncture site. Site is hard to touch. No ecchymosis, erythema, edema, tenderness, bleeding or drainage. Minimal amount of old blood on the dressing. Dressing otherwise clean, dry, and intact.   Skin:    General: Skin is warm and dry.     Findings: No erythema.  Neurological:     Mental Status: He is alert and oriented to person, place, and time.     Imaging: IR Veno/Ext/Uni Right  Result Date: 11/02/2020 INDICATION: 65 year old male with history of recent hemorrhagic stroke status post IVC filter placement on 10/08/2020 with subsequent propagation of right greater than left lower extremity deep vein thrombosis with ileocaval involvement and near occlusion of the inferior vena cava. Status post suction thrombectomy on 10/27/2020 with inadequate clearance of iliocaval thrombus. EXAM: 1. Ultrasound-guided venous access of the right popliteal vein. 2. Ultrasound-guided venous access of the right internal jugular vein. 3. Right lower extremity venogram. 4. Inferior vena cavagram. 5. Intravascular ultrasound. 6. Mechanical thrombectomy of the inferior vena cava, right internal and external iliac, common femoral, and femoral veins. 7. Suction thrombectomy of the inferior vena cava, right internal and external iliac, common femoral, and femoral veins. 8. Balloon angioplasty of the right femoral and common femoral veins. COMPARISON:  10/26/2020, 10/27/2020 MEDICATIONS: 13000 units heparin, intravenous ANESTHESIA/SEDATION: Versed 5 mg IV; Fentanyl 250 mcg IV Moderate Sedation Time:  2 hours, 45 minutes The patient was continuously monitored during the procedure by the interventional radiology nurse under my direct supervision. FLUOROSCOPY TIME:  Fluoroscopy Time:  25 minutes 18 seconds (81 mGy). CONTRAST:  75 mL Omnipaque 300, intravenous COMPLICATIONS: None immediate. TECHNIQUE: Informed written consent was obtained from the patient after a thorough discussion of the procedural risks, benefits and alternatives. All questions were addressed. Maximal Sterile Barrier Technique was utilized including caps, mask, sterile gowns, sterile gloves, sterile drape, hand hygiene and skin antiseptic. A timeout was performed prior to the initiation of the procedure. The bilateral lower extremities and right neck were prepped and draped in standard fashion. Under direct ultrasound visualization, the distal right popliteal vein was identified with the patient's leg in frog-leg position which demonstrated mixed acute and subacute thrombus. Subdermal local anesthesia was administered at the planned needle entry site. A skin nick was made. Under direct ultrasound visualization, the distal popliteal vein was accessed with a 21 gauge micropuncture needle. A 0.018 inch Nitrex wire was then directed into the femoral vein with minimal resistance. A micropuncture sheath was placed. Serial dilation was performed and an 8 [place], 11 cm vascular sheath was placed. A Glidewire was directed into the suprarenal inferior vena cava and exchanged over a 5 French catheter for a superstiff exchange length Amplatz wire. Right lower extremity venogram was then performed with hand injection. Venogram was  significant for nearly occlusive thrombus throughout the visualized femoral vein and common femoral vein. There was sluggish flow through the external and common iliac veins into the inferior vena cava where there is peripheral flow around large residual thrombus burden within and inferior to the indwelling inferior vena cava. Intravascular ultrasound was performed which demonstrated similar findings to venogram: Echogenic, expansile thrombus throughout the visualized femoral vein into the common femoral vein,  patent external iliac vein, wall adherent echogenic thrombus within the common iliac vein, patent inferior aspect of the inferior vena cava and multifocal echogenic thrombus within the midportion of the infrarenal inferior vena cava extending into the indwelling inferior vena cava filter which is well positioned. The IVC is widely patent superior to the filter. Next, the internal jugular vein was assessed with ultrasound and was compressible and free of internal echoes. Subdermal Local anesthesia was administered 1% lidocaine at the planned needle entry site. A skin nick was made. Under direct ultrasound visualization, a 21 gauge micropuncture needle was advanced into the internal jugular vein and a micropuncture set was placed. Through this, a Wholey wire was directed to the inferior vena cava. Serial dilation was performed and a 16 French, 70 cm sheath was placed, directed under direct fluoroscopic guidance through the indwelling inferior vena cava filter. Then, from the thigh lower extremity access, an Amplatz wire was directed into the distal aspect of the sheath lumen and through and through flossing access was established. The indwelling 8 French sheath was then exchanged for a 16 [place] ClotTriever sheath after serial dilation under fluoroscopic guidance. The ClotTriever catheter was then advanced into the 16 French IJ sheath, the coring element was exposed, and mechanical thrombectomy was performed from the inferior vena cava through the femoral vein. A total of 6 passes with this technique were performed yielding large volume of acute and chronic appearing thrombus. Repeat venogram was performed through the indwelling popliteal sheath which demonstrated significantly improved patency inflow of the femoral vein and inferior vena cava. There is significant flow-limiting focal stenosis in the mid femoral vein and at the saphenofemoral junction. Intravascular ultrasound was then again performed which  demonstrated significantly improved patency of the femoral vein with the exception of echogenic wall adherent thrombus in the mid femoral vein in addition to at the saphenofemoral junction. There is persistent echogenic thrombus just inferior to the indwelling inferior vena cava filter, decreased from comparison. Balloon angioplasty was then performed in the mid femoral vein and at the saphenofemoral junction with a 12 mm by 4 cm atlas balloon. Repeat venogram demonstrated minimally improved patency at the 2 stenotic areas. Therefore, 2 additional passes with the ClotTriever device were performed from the common femoral vein through the femoral vein yielding small volume chronic appearing thrombus. Repeat intravascular ultrasound and venogram demonstrated persistent flow limiting focal stenosis at the saphenofemoral junction and persistent chronic appearing thrombus within the inferior vena cava, just inferior to the indwelling filter. Therefore, the 54 [place] FlowTriever device was used to perform suction thrombectomy within the inferior aspect of the filter as well as in the femoral vein at multiple sites. Multiple acute on chronic thrombi were aspirated from the inferior vena cava. There is minimal yield in the femoral vein. Repeat intravascular ultrasound venogram demonstrated persistent flow limiting stenosis at the saphenofemoral junction and improved thrombus burden in the inferior vena cava. Therefore, repeat balloon angioplasty of saphenofemoral junction and in the mid femoral vein was performed with a 14 mm x 4 cm atlas balloon with 1  minute inflation times. Completion venography and intravascular ultrasound was then performed. There is significantly improved patency of the mid femoral vein focal stenosis. There is persistent chronic appearing thrombus but improved patency at the saphenofemoral junction. The distal aspect of the inferior vena cava is widely patent. There is persistent small volume chronic  appearing thrombus just inferior to the indwelling inferior vena cava filter. The superior aspect of the inferior vena cava remains widely patent. The catheters and sheath were then removed. A per string stitch was placed at the popliteal access site. Manual compression was applied at both sites for approximately 5 minutes and hemostasis was achieved. The patient tolerated the procedure well and was returned to the floor in stable condition. FINDINGS: Severe mixed acute and chronic deep vein thrombosis extending from the right calf to the indwelling inferior vena cava. Technically successful mechanical thrombectomy, suction thrombectomy, and balloon angioplasty. Small volume residual IVC and right femoral chronic appearing thrombus, most prominent at the saphenofemoral junction. IMPRESSION: 1. Severe mixed acute and chronic deep vein thrombosis extending from the right calf to the indwelling inferior vena cava. 2. Technically successful mechanical thrombectomy, suction thrombectomy, and balloon angioplasty from the right femoral vein to the inferior vena cava. 3. Small volume residual inferior vena cava and right femoral chronic appearing thrombus. PLAN: 1. Bilateral thigh high compression stockings (20-30 mmHg) at all times. Continue compression stocking use after discharge. 2. Bilateral SCDs at all times unless ambulating. 3. Continue anticoagulation. 4. Follow up in IR clinic in 2 months with CTV abdomen/pelvis. Marliss Coots, MD [facility] Radiology Electronically Signed   By: Marliss Coots MD   On: 11/03/2020 08:38   IR [facility] MOD SED  Result Date: 11/03/2020 INDICATION: 65 year old male with history of recent hemorrhagic stroke status post IVC filter placement on 10/08/2020 with subsequent propagation of right greater than left lower extremity deep vein thrombosis with ileocaval involvement and near occlusion of the inferior vena cava. Status  post suction thrombectomy on 10/27/2020 with inadequate clearance of iliocaval thrombus. EXAM: 1. Ultrasound-guided venous access of the right popliteal vein. 2. Ultrasound-guided venous access of the right internal jugular vein. 3. Right lower extremity venogram. 4. Inferior vena cavagram. 5. Intravascular ultrasound. 6. Mechanical thrombectomy of the inferior vena cava, right internal and external iliac, common femoral, and femoral veins. 7. Suction thrombectomy of the inferior vena cava, right internal and external iliac, common femoral, and femoral veins. 8. Balloon angioplasty of the right femoral and common femoral veins. COMPARISON:  10/26/2020, 10/27/2020 MEDICATIONS: 13000 units heparin, intravenous ANESTHESIA/SEDATION: Versed 5 mg IV; Fentanyl 250 mcg IV Moderate Sedation Time:  2 hours, 45 minutes The patient was continuously monitored during the procedure by the interventional radiology nurse under my direct supervision. FLUOROSCOPY TIME:  Fluoroscopy Time: 25 minutes 18 seconds (81 mGy). CONTRAST:  75 mL Omnipaque 300, intravenous COMPLICATIONS: None immediate. TECHNIQUE: Informed written consent was obtained from the patient after a thorough discussion of the procedural risks, benefits and alternatives. All questions were addressed. Maximal Sterile Barrier Technique was utilized including caps, mask, sterile gowns, sterile gloves, sterile drape, hand hygiene and skin antiseptic. A timeout was performed prior to the initiation of the procedure. The bilateral lower extremities and right neck were prepped and draped in standard fashion. Under direct ultrasound visualization, the distal right popliteal vein was identified with the patient's leg in frog-leg position which demonstrated mixed acute and subacute thrombus. Subdermal local anesthesia was administered at the planned needle entry  site. A skin nick was made. Under direct ultrasound visualization, the distal popliteal vein was accessed with a 21  gauge micropuncture needle. A 0.018 inch Nitrex wire was then directed into the femoral vein with minimal resistance. A micropuncture sheath was placed. Serial dilation was performed and an 8 [place], 11 cm vascular sheath was placed. A Glidewire was directed into the suprarenal inferior vena cava and exchanged over a 5 French catheter for a superstiff exchange length Amplatz wire. Right lower extremity venogram was then performed with hand injection. Venogram was significant for nearly occlusive thrombus throughout the visualized femoral vein and common femoral vein. There was sluggish flow through the external and common iliac veins into the inferior vena cava where there is peripheral flow around large residual thrombus burden within and inferior to the indwelling inferior vena cava. Intravascular ultrasound was performed which demonstrated similar findings to venogram: Echogenic, expansile thrombus throughout the visualized femoral vein into the common femoral vein, patent external iliac vein, wall adherent echogenic thrombus within the common iliac vein, patent inferior aspect of the inferior vena cava and multifocal echogenic thrombus within the midportion of the infrarenal inferior vena cava extending into the indwelling inferior vena cava filter which is well positioned. The IVC is widely patent superior to the filter. Next, the internal jugular vein was assessed with ultrasound and was compressible and free of internal echoes. Subdermal Local anesthesia was administered 1% lidocaine at the planned needle entry site. A skin nick was made. Under direct ultrasound visualization, a 21 gauge micropuncture needle was advanced into the internal jugular vein and a micropuncture set was placed. Through this, a Wholey wire was directed to the inferior vena cava. Serial dilation was performed and a 16 French, 70 cm sheath was placed, directed under direct fluoroscopic guidance through the indwelling inferior vena cava  filter. Then, from the thigh lower extremity access, an Amplatz wire was directed into the distal aspect of the sheath lumen and through and through flossing access was established. The indwelling 8 French sheath was then exchanged for a 16 [place] ClotTriever sheath after serial dilation under fluoroscopic guidance. The ClotTriever catheter was then advanced into the 16 French IJ sheath, the coring element was exposed, and mechanical thrombectomy was performed from the inferior vena cava through the femoral vein. A total of 6 passes with this technique were performed yielding large volume of acute and chronic appearing thrombus. Repeat venogram was performed through the indwelling popliteal sheath which demonstrated significantly improved patency inflow of the femoral vein and inferior vena cava. There is significant flow-limiting focal stenosis in the mid femoral vein and at the saphenofemoral junction. Intravascular ultrasound was then again performed which demonstrated significantly improved patency of the femoral vein with the exception of echogenic wall adherent thrombus in the mid femoral vein in addition to at the saphenofemoral junction. There is persistent echogenic thrombus just inferior to the indwelling inferior vena cava filter, decreased from comparison. Balloon angioplasty was then performed in the mid femoral vein and at the saphenofemoral junction with a 12 mm by 4 cm atlas balloon. Repeat venogram demonstrated minimally improved patency at the 2 stenotic areas. Therefore, 2 additional passes with the ClotTriever device were performed from the common femoral vein through the femoral vein yielding small volume chronic appearing thrombus. Repeat intravascular ultrasound and venogram demonstrated persistent flow limiting focal stenosis at the saphenofemoral junction and persistent chronic appearing thrombus within the inferior vena cava, just inferior to the indwelling filter. Therefore, the 30 [place]  FlowTriever device was used to perform suction thrombectomy within the inferior aspect of the filter as well as in the femoral vein at multiple sites. Multiple acute on chronic thrombi were aspirated from the inferior vena cava. There is minimal yield in the femoral vein. Repeat intravascular ultrasound venogram demonstrated persistent flow limiting stenosis at the saphenofemoral junction and improved thrombus burden in the inferior vena cava. Therefore, repeat balloon angioplasty of saphenofemoral junction and in the mid femoral vein was performed with a 14 mm x 4 cm atlas balloon with 1 minute inflation times. Completion venography and intravascular ultrasound was then performed. There is significantly improved patency of the mid femoral vein focal stenosis. There is persistent chronic appearing thrombus but improved patency at the saphenofemoral junction. The distal aspect of the inferior vena cava is widely patent. There is persistent small volume chronic appearing thrombus just inferior to the indwelling inferior vena cava filter. The superior aspect of the inferior vena cava remains widely patent. The catheters and sheath were then removed. A per string stitch was placed at the popliteal access site. Manual compression was applied at both sites for approximately 5 minutes and hemostasis was achieved. The patient tolerated the procedure well and was returned to the floor in stable condition. FINDINGS: Severe mixed acute and chronic deep vein thrombosis extending from the right calf to the indwelling inferior vena cava. Technically successful mechanical thrombectomy, suction thrombectomy, and balloon angioplasty. Small volume residual IVC and right femoral chronic appearing thrombus, most prominent at the saphenofemoral junction. IMPRESSION: 1. Severe mixed acute and chronic deep vein thrombosis extending from the right calf to the indwelling inferior vena cava. 2. Technically successful mechanical thrombectomy,  suction thrombectomy, and balloon angioplasty from the right femoral vein to the inferior vena cava. 3. Small volume residual inferior vena cava and right femoral chronic appearing thrombus. PLAN: 1. Bilateral thigh high compression stockings (20-30 mmHg) at all times. Continue compression stocking use after discharge. 2. Bilateral SCDs at all times unless ambulating. 3. Continue anticoagulation. 4. Follow up in IR clinic in 2 months with CTV abdomen/pelvis. Marliss Coots, MD [facility] Radiology Electronically Signed   By: Marliss Coots MD   On: 11/03/2020 08:38   IR US Guide Vasc Access Right  INDICATION: 65 year old male with history of recent hemorrhagic stroke status post IVC filter placement on 10/08/2020 with subsequent propagation of right greater than left lower extremity deep vein thrombosis with ileocaval involvement and near occlusion of the inferior vena cava. Status post suction thrombectomy on 10/27/2020 with inadequate clearance of iliocaval thrombus. EXAM: 1. Ultrasound-guided venous access of the right popliteal vein. 2. Ultrasound-guided venous access of the right internal jugular vein. 3. Right lower extremity venogram. 4. Inferior vena cavagram. 5. Intravascular ultrasound. 6. Mechanical thrombectomy of the inferior vena cava, right internal and external iliac, common femoral, and femoral veins. 7. Suction thrombectomy of the inferior vena cava, right internal and external iliac, common femoral, and femoral veins. 8. Balloon angioplasty of the right femoral and common femoral veins. COMPARISON:  10/26/2020, 10/27/2020 MEDICATIONS: 13000 units heparin, intravenous ANESTHESIA/SEDATION: Versed 5 mg IV; Fentanyl 250 mcg IV Moderate Sedation Time:  2 hours, 45 minutes The patient was continuously monitored during the procedure by the interventional radiology nurse under my direct supervision. FLUOROSCOPY TIME:  Fluoroscopy Time: 25 minutes 18  seconds (81 mGy). CONTRAST:  75 mL Omnipaque 300, intravenous COMPLICATIONS: None immediate. TECHNIQUE: Informed written consent was obtained from the patient after a thorough discussion of  the procedural risks, benefits and alternatives. All questions were addressed. Maximal Sterile Barrier Technique was utilized including caps, mask, sterile gowns, sterile gloves, sterile drape, hand hygiene and skin antiseptic. A timeout was performed prior to the initiation of the procedure. The bilateral lower extremities and right neck were prepped and draped in standard fashion. Under direct ultrasound visualization, the distal right popliteal vein was identified with the patient's leg in frog-leg position which demonstrated mixed acute and subacute thrombus. Subdermal local anesthesia was administered at the planned needle entry site. A skin nick was made. Under direct ultrasound visualization, the distal popliteal vein was accessed with a 21 gauge micropuncture needle. A 0.018 inch Nitrex wire was then directed into the femoral vein with minimal resistance. A micropuncture sheath was placed. Serial dilation was performed and an 8 [place], 11 cm vascular sheath was placed. A Glidewire was directed into the suprarenal inferior vena cava and exchanged over a 5 French catheter for a superstiff exchange length Amplatz wire. Right lower extremity venogram was then performed with hand injection. Venogram was significant for nearly occlusive thrombus throughout the visualized femoral vein and common femoral vein. There was sluggish flow through the external and common iliac veins into the inferior vena cava where there is peripheral flow around large residual thrombus burden within and inferior to the indwelling inferior vena cava. Intravascular ultrasound was performed which demonstrated similar findings to venogram: Echogenic, expansile thrombus throughout the visualized femoral vein into the common femoral vein, patent external  iliac vein, wall adherent echogenic thrombus within the common iliac vein, patent inferior aspect of the inferior vena cava and multifocal echogenic thrombus within the midportion of the infrarenal inferior vena cava extending into the indwelling inferior vena cava filter which is well positioned. The IVC is widely patent superior to the filter. Next, the internal jugular vein was assessed with ultrasound and was compressible and free of internal echoes. Subdermal Local anesthesia was administered 1% lidocaine at the planned needle entry site. A skin nick was made. Under direct ultrasound visualization, a 21 gauge micropuncture needle was advanced into the internal jugular vein and a micropuncture set was placed. Through this, a Wholey wire was directed to the inferior vena cava. Serial dilation was performed and a 16 French, 70 cm sheath was placed, directed under direct fluoroscopic guidance through the indwelling inferior vena cava filter. Then, from the thigh lower extremity access, an Amplatz wire was directed into the distal aspect of the sheath lumen and through and through flossing access was established. The indwelling 8 French sheath was then exchanged for a 16 [place] ClotTriever sheath after serial dilation under fluoroscopic guidance. The ClotTriever catheter was then advanced into the 16 French IJ sheath, the coring element was exposed, and mechanical thrombectomy was performed from the inferior vena cava through the femoral vein. A total of 6 passes with this technique were performed yielding large volume of acute and chronic appearing thrombus. Repeat venogram was performed through the indwelling popliteal sheath which demonstrated significantly improved patency inflow of the femoral vein and inferior vena cava. There is significant flow-limiting focal stenosis in the mid femoral vein and at the saphenofemoral junction. Intravascular ultrasound was then again performed which demonstrated significantly  improved patency of the femoral vein with the exception of echogenic wall adherent thrombus in the mid femoral vein in addition to at the saphenofemoral junction. There is persistent echogenic thrombus just inferior to the indwelling inferior vena cava filter, decreased from comparison. Balloon angioplasty was then performed in  the mid femoral vein and at the saphenofemoral junction with a 12 mm by 4 cm atlas balloon. Repeat venogram demonstrated minimally improved patency at the 2 stenotic areas. Therefore, 2 additional passes with the ClotTriever device were performed from the common femoral vein through the femoral vein yielding small volume chronic appearing thrombus. Repeat intravascular ultrasound and venogram demonstrated persistent flow limiting focal stenosis at the saphenofemoral junction and persistent chronic appearing thrombus within the inferior vena cava, just inferior to the indwelling filter. Therefore, the 54 [place] FlowTriever device was used to perform suction thrombectomy within the inferior aspect of the filter as well as in the femoral vein at multiple sites. Multiple acute on chronic thrombi were aspirated from the inferior vena cava. There is minimal yield in the femoral vein. Repeat intravascular ultrasound venogram demonstrated persistent flow limiting stenosis at the saphenofemoral junction and improved thrombus burden in the inferior vena cava. Therefore, repeat balloon angioplasty of saphenofemoral junction and in the mid femoral vein was performed with a 14 mm x 4 cm atlas balloon with 1 minute inflation times. Completion venography and intravascular ultrasound was then performed. There is significantly improved patency of the mid femoral vein focal stenosis. There is persistent chronic appearing thrombus but improved patency at the saphenofemoral junction. The distal aspect of the inferior vena cava is widely patent. There is persistent small volume chronic appearing thrombus just  inferior to the indwelling inferior vena cava filter. The superior aspect of the inferior vena cava remains widely patent. The catheters and sheath were then removed. A per string stitch was placed at the popliteal access site. Manual compression was applied at both sites for approximately 5 minutes and hemostasis was achieved. The patient tolerated the procedure well and was returned to the floor in stable condition. FINDINGS: Severe mixed acute and chronic deep vein thrombosis extending from the right calf to the indwelling inferior vena cava. Technically successful mechanical thrombectomy, suction thrombectomy, and balloon angioplasty. Small volume residual IVC and right femoral chronic appearing thrombus, most prominent at the saphenofemoral junction. IMPRESSION: 1. Severe mixed acute and chronic deep vein thrombosis extending from the right calf to the indwelling inferior vena cava. 2. Technically successful mechanical thrombectomy, suction thrombectomy, and balloon angioplasty from the right femoral vein to the inferior vena cava. 3. Small volume residual inferior vena cava and right femoral chronic appearing thrombus. PLAN: 1. Bilateral thigh high compression stockings (20-30 mmHg) at all times. Continue compression stocking use after discharge. 2. Bilateral SCDs at all times unless ambulating. 3. Continue anticoagulation. 4. Follow up in IR clinic in 2 months with CTV abdomen/pelvis. Marliss Coots, MD [facility] Radiology Electronically Signed   By: Marliss Coots MD   On: 11/03/2020 08:38   IR PTA VENOUS EXCEPT DIALYSIS CIRCUIT  INDICATION: 65 year old male with history of recent hemorrhagic stroke status post IVC filter placement on 10/08/2020 with subsequent propagation of right greater than left lower extremity deep vein thrombosis with ileocaval involvement and near occlusion of the inferior vena cava. Status post suction thrombectomy on 10/27/2020  with inadequate clearance of iliocaval thrombus. EXAM: 1. Ultrasound-guided venous access of the right popliteal vein. 2. Ultrasound-guided venous access of the right internal jugular vein. 3. Right lower extremity venogram. 4. Inferior vena cavagram. 5. Intravascular ultrasound. 6. Mechanical thrombectomy of the inferior vena cava, right internal and external iliac, common femoral, and femoral veins. 7. Suction thrombectomy of the inferior vena cava, right internal and external iliac, common femoral, and femoral veins.  8. Balloon angioplasty of the right femoral and common femoral veins. COMPARISON:  10/26/2020, 10/27/2020 MEDICATIONS: 13000 units heparin, intravenous ANESTHESIA/SEDATION: Versed 5 mg IV; Fentanyl 250 mcg IV Moderate Sedation Time:  2 hours, 45 minutes The patient was continuously monitored during the procedure by the interventional radiology nurse under my direct supervision. FLUOROSCOPY TIME:  Fluoroscopy Time: 25 minutes 18 seconds (81 mGy). CONTRAST:  75 mL Omnipaque 300, intravenous COMPLICATIONS: None immediate. TECHNIQUE: Informed written consent was obtained from the patient after a thorough discussion of the procedural risks, benefits and alternatives. All questions were addressed. Maximal Sterile Barrier Technique was utilized including caps, mask, sterile gowns, sterile gloves, sterile drape, hand hygiene and skin antiseptic. A timeout was performed prior to the initiation of the procedure. The bilateral lower extremities and right neck were prepped and draped in standard fashion. Under direct ultrasound visualization, the distal right popliteal vein was identified with the patient's leg in frog-leg position which demonstrated mixed acute and subacute thrombus. Subdermal local anesthesia was administered at the planned needle entry site. A skin nick was made. Under direct ultrasound visualization, the distal popliteal vein was accessed with a 21 gauge micropuncture needle. A 0.018 inch  Nitrex wire was then directed into the femoral vein with minimal resistance. A micropuncture sheath was placed. Serial dilation was performed and an 8 [place], 11 cm vascular sheath was placed. A Glidewire was directed into the suprarenal inferior vena cava and exchanged over a 5 French catheter for a superstiff exchange length Amplatz wire. Right lower extremity venogram was then performed with hand injection. Venogram was significant for nearly occlusive thrombus throughout the visualized femoral vein and common femoral vein. There was sluggish flow through the external and common iliac veins into the inferior vena cava where there is peripheral flow around large residual thrombus burden within and inferior to the indwelling inferior vena cava. Intravascular ultrasound was performed which demonstrated similar findings to venogram: Echogenic, expansile thrombus throughout the visualized femoral vein into the common femoral vein, patent external iliac vein, wall adherent echogenic thrombus within the common iliac vein, patent inferior aspect of the inferior vena cava and multifocal echogenic thrombus within the midportion of the infrarenal inferior vena cava extending into the indwelling inferior vena cava filter which is well positioned. The IVC is widely patent superior to the filter. Next, the internal jugular vein was assessed with ultrasound and was compressible and free of internal echoes. Subdermal Local anesthesia was administered 1% lidocaine at the planned needle entry site. A skin nick was made. Under direct ultrasound visualization, a 21 gauge micropuncture needle was advanced into the internal jugular vein and a micropuncture set was placed. Through this, a Wholey wire was directed to the inferior vena cava. Serial dilation was performed and a 16 French, 70 cm sheath was placed, directed under direct fluoroscopic guidance through the indwelling inferior vena cava filter. Then, from the thigh lower  extremity access, an Amplatz wire was directed into the distal aspect of the sheath lumen and through and through flossing access was established. The indwelling 8 French sheath was then exchanged for a 16 [place] ClotTriever sheath after serial dilation under fluoroscopic guidance. The ClotTriever catheter was then advanced into the 16 French IJ sheath, the coring element was exposed, and mechanical thrombectomy was performed from the inferior vena cava through the femoral vein. A total of 6 passes with this technique were performed yielding large volume of acute and chronic appearing thrombus. Repeat venogram was performed through the indwelling popliteal  sheath which demonstrated significantly improved patency inflow of the femoral vein and inferior vena cava. There is significant flow-limiting focal stenosis in the mid femoral vein and at the saphenofemoral junction. Intravascular ultrasound was then again performed which demonstrated significantly improved patency of the femoral vein with the exception of echogenic wall adherent thrombus in the mid femoral vein in addition to at the saphenofemoral junction. There is persistent echogenic thrombus just inferior to the indwelling inferior vena cava filter, decreased from comparison. Balloon angioplasty was then performed in the mid femoral vein and at the saphenofemoral junction with a 12 mm by 4 cm atlas balloon. Repeat venogram demonstrated minimally improved patency at the 2 stenotic areas. Therefore, 2 additional passes with the ClotTriever device were performed from the common femoral vein through the femoral vein yielding small volume chronic appearing thrombus. Repeat intravascular ultrasound and venogram demonstrated persistent flow limiting focal stenosis at the saphenofemoral junction and persistent chronic appearing thrombus within the inferior vena cava, just inferior to the indwelling filter. Therefore, the 91 [place] FlowTriever device was used to  perform suction thrombectomy within the inferior aspect of the filter as well as in the femoral vein at multiple sites. Multiple acute on chronic thrombi were aspirated from the inferior vena cava. There is minimal yield in the femoral vein. Repeat intravascular ultrasound venogram demonstrated persistent flow limiting stenosis at the saphenofemoral junction and improved thrombus burden in the inferior vena cava. Therefore, repeat balloon angioplasty of saphenofemoral junction and in the mid femoral vein was performed with a 14 mm x 4 cm atlas balloon with 1 minute inflation times. Completion venography and intravascular ultrasound was then performed. There is significantly improved patency of the mid femoral vein focal stenosis. There is persistent chronic appearing thrombus but improved patency at the saphenofemoral junction. The distal aspect of the inferior vena cava is widely patent. There is persistent small volume chronic appearing thrombus just inferior to the indwelling inferior vena cava filter. The superior aspect of the inferior vena cava remains widely patent. The catheters and sheath were then removed. A per string stitch was placed at the popliteal access site. Manual compression was applied at both sites for approximately 5 minutes and hemostasis was achieved. The patient tolerated the procedure well and was returned to the floor in stable condition. FINDINGS: Severe mixed acute and chronic deep vein thrombosis extending from the right calf to the indwelling inferior vena cava. Technically successful mechanical thrombectomy, suction thrombectomy, and balloon angioplasty. Small volume residual IVC and right femoral chronic appearing thrombus, most prominent at the saphenofemoral junction. IMPRESSION: 1. Severe mixed acute and chronic deep vein thrombosis extending from the right calf to the indwelling inferior vena cava. 2. Technically successful mechanical thrombectomy, suction thrombectomy, and  balloon angioplasty from the right femoral vein to the inferior vena cava. 3. Small volume residual inferior vena cava and right femoral chronic appearing thrombus. PLAN: 1. Bilateral thigh high compression stockings (20-30 mmHg) at all times. Continue compression stocking use after discharge. 2. Bilateral SCDs at all times unless ambulating. 3. Continue anticoagulation. 4. Follow up in IR clinic in 2 months with CTV abdomen/pelvis. Marliss Coots, MD [facility] Radiology Electronically Signed   By: Marliss Coots MD   On: 11/03/2020 08:38   IR IVUS EACH ADDITIONAL NON CORONARY VESSEL  INDICATION: 65 year old male with history of recent hemorrhagic stroke status post IVC filter placement on 10/08/2020 with subsequent propagation of right greater than left lower extremity deep vein thrombosis with  ileocaval involvement and near occlusion of the inferior vena cava. Status post suction thrombectomy on 10/27/2020 with inadequate clearance of iliocaval thrombus. EXAM: 1. Ultrasound-guided venous access of the right popliteal vein. 2. Ultrasound-guided venous access of the right internal jugular vein. 3. Right lower extremity venogram. 4. Inferior vena cavagram. 5. Intravascular ultrasound. 6. Mechanical thrombectomy of the inferior vena cava, right internal and external iliac, common femoral, and femoral veins. 7. Suction thrombectomy of the inferior vena cava, right internal and external iliac, common femoral, and femoral veins. 8. Balloon angioplasty of the right femoral and common femoral veins. COMPARISON:  10/26/2020, 10/27/2020 MEDICATIONS: 13000 units heparin, intravenous ANESTHESIA/SEDATION: Versed 5 mg IV; Fentanyl 250 mcg IV Moderate Sedation Time:  2 hours, 45 minutes The patient was continuously monitored during the procedure by the interventional radiology nurse under my direct supervision. FLUOROSCOPY TIME:  Fluoroscopy Time: 25 minutes 18 seconds (81 mGy).  CONTRAST:  75 mL Omnipaque 300, intravenous COMPLICATIONS: None immediate. TECHNIQUE: Informed written consent was obtained from the patient after a thorough discussion of the procedural risks, benefits and alternatives. All questions were addressed. Maximal Sterile Barrier Technique was utilized including caps, mask, sterile gowns, sterile gloves, sterile drape, hand hygiene and skin antiseptic. A timeout was performed prior to the initiation of the procedure. The bilateral lower extremities and right neck were prepped and draped in standard fashion. Under direct ultrasound visualization, the distal right popliteal vein was identified with the patient's leg in frog-leg position which demonstrated mixed acute and subacute thrombus. Subdermal local anesthesia was administered at the planned needle entry site. A skin nick was made. Under direct ultrasound visualization, the distal popliteal vein was accessed with a 21 gauge micropuncture needle. A 0.018 inch Nitrex wire was then directed into the femoral vein with minimal resistance. A micropuncture sheath was placed. Serial dilation was performed and an 8 [place], 11 cm vascular sheath was placed. A Glidewire was directed into the suprarenal inferior vena cava and exchanged over a 5 French catheter for a superstiff exchange length Amplatz wire. Right lower extremity venogram was then performed with hand injection. Venogram was significant for nearly occlusive thrombus throughout the visualized femoral vein and common femoral vein. There was sluggish flow through the external and common iliac veins into the inferior vena cava where there is peripheral flow around large residual thrombus burden within and inferior to the indwelling inferior vena cava. Intravascular ultrasound was performed which demonstrated similar findings to venogram: Echogenic, expansile thrombus throughout the visualized femoral vein into the common femoral vein, patent external iliac vein, wall  adherent echogenic thrombus within the common iliac vein, patent inferior aspect of the inferior vena cava and multifocal echogenic thrombus within the midportion of the infrarenal inferior vena cava extending into the indwelling inferior vena cava filter which is well positioned. The IVC is widely patent superior to the filter. Next, the internal jugular vein was assessed with ultrasound and was compressible and free of internal echoes. Subdermal Local anesthesia was administered 1% lidocaine at the planned needle entry site. A skin nick was made. Under direct ultrasound visualization, a 21 gauge micropuncture needle was advanced into the internal jugular vein and a micropuncture set was placed. Through this, a Wholey wire was directed to the inferior vena cava. Serial dilation was performed and a 16 French, 70 cm sheath was placed, directed under direct fluoroscopic guidance through the indwelling inferior vena cava filter. Then, from the thigh lower extremity access, an Amplatz wire was directed into the distal aspect  of the sheath lumen and through and through flossing access was established. The indwelling 8 French sheath was then exchanged for a 16 [place] ClotTriever sheath after serial dilation under fluoroscopic guidance. The ClotTriever catheter was then advanced into the 16 French IJ sheath, the coring element was exposed, and mechanical thrombectomy was performed from the inferior vena cava through the femoral vein. A total of 6 passes with this technique were performed yielding large volume of acute and chronic appearing thrombus. Repeat venogram was performed through the indwelling popliteal sheath which demonstrated significantly improved patency inflow of the femoral vein and inferior vena cava. There is significant flow-limiting focal stenosis in the mid femoral vein and at the saphenofemoral junction. Intravascular ultrasound was then again performed which demonstrated significantly improved patency  of the femoral vein with the exception of echogenic wall adherent thrombus in the mid femoral vein in addition to at the saphenofemoral junction. There is persistent echogenic thrombus just inferior to the indwelling inferior vena cava filter, decreased from comparison. Balloon angioplasty was then performed in the mid femoral vein and at the saphenofemoral junction with a 12 mm by 4 cm atlas balloon. Repeat venogram demonstrated minimally improved patency at the 2 stenotic areas. Therefore, 2 additional passes with the ClotTriever device were performed from the common femoral vein through the femoral vein yielding small volume chronic appearing thrombus. Repeat intravascular ultrasound and venogram demonstrated persistent flow limiting focal stenosis at the saphenofemoral junction and persistent chronic appearing thrombus within the inferior vena cava, just inferior to the indwelling filter. Therefore, the 38 [place] FlowTriever device was used to perform suction thrombectomy within the inferior aspect of the filter as well as in the femoral vein at multiple sites. Multiple acute on chronic thrombi were aspirated from the inferior vena cava. There is minimal yield in the femoral vein. Repeat intravascular ultrasound venogram demonstrated persistent flow limiting stenosis at the saphenofemoral junction and improved thrombus burden in the inferior vena cava. Therefore, repeat balloon angioplasty of saphenofemoral junction and in the mid femoral vein was performed with a 14 mm x 4 cm atlas balloon with 1 minute inflation times. Completion venography and intravascular ultrasound was then performed. There is significantly improved patency of the mid femoral vein focal stenosis. There is persistent chronic appearing thrombus but improved patency at the saphenofemoral junction. The distal aspect of the inferior vena cava is widely patent. There is persistent small volume chronic appearing thrombus just inferior to the  indwelling inferior vena cava filter. The superior aspect of the inferior vena cava remains widely patent. The catheters and sheath were then removed. A per string stitch was placed at the popliteal access site. Manual compression was applied at both sites for approximately 5 minutes and hemostasis was achieved. The patient tolerated the procedure well and was returned to the floor in stable condition. FINDINGS: Severe mixed acute and chronic deep vein thrombosis extending from the right calf to the indwelling inferior vena cava. Technically successful mechanical thrombectomy, suction thrombectomy, and balloon angioplasty. Small volume residual IVC and right femoral chronic appearing thrombus, most prominent at the saphenofemoral junction. IMPRESSION: 1. Severe mixed acute and chronic deep vein thrombosis extending from the right calf to the indwelling inferior vena cava. 2. Technically successful mechanical thrombectomy, suction thrombectomy, and balloon angioplasty from the right femoral vein to the inferior vena cava. 3. Small volume residual inferior vena cava and right femoral chronic appearing thrombus. PLAN: 1. Bilateral thigh high compression stockings (20-30 mmHg) at all times. Continue compression stocking  use after discharge. 2. Bilateral SCDs at all times unless ambulating. 3. Continue anticoagulation. 4. Follow up in IR clinic in 2 months with CTV abdomen/pelvis. Marliss Coots, MD [facility] Electronically Signed   By: Marliss Coots MD   On: 11/03/2020 08:38    Labs:  CBC: Recent Labs    10/23/20 0751 10/25/20 1610 10/27/20 0144 10/28/20 0226 10/29/20 0706  WBC 8.4 8.3 7.2  --  6.1  HGB 9.2* 9.0* 9.2* 8.3* 9.0*  HCT 29.6* 28.7* 29.2* 25.6* 28.3*  PLT 175 181 199  --  227    COAGS: Recent Labs    09/25/20 0309 10/25/20 1041  INR 1.0 1.1  APTT 27 34    BMP: Recent Labs    12/13/19 1155 03/02/20 0853 09/25/20 0309  10/03/20 9604 10/03/20 0854 10/27/20 0144 10/28/20 0226 10/28/20 1845 10/29/20 0706  NA 139 141   < > QUESTIONABLE RESULTS, RECOMMEND RECOLLECT TO VERIFY   < > 132* 133* 134* 133*  K 4.3 4.1   < > QUESTIONABLE RESULTS, RECOMMEND RECOLLECT TO VERIFY   < > 4.1 4.0 4.0 4.0  CL 102 103   < > QUESTIONABLE RESULTS, RECOMMEND RECOLLECT TO VERIFY   < > 99 101 101 100  CO2 27 23   < > QUESTIONABLE RESULTS, RECOMMEND RECOLLECT TO VERIFY   < > 25 24 25 25   GLUCOSE 103* 115*   < > QUESTIONABLE RESULTS, RECOMMEND RECOLLECT TO VERIFY   < > 98 137* 107* 122*  BUN 13 13   < > QUESTIONABLE RESULTS, RECOMMEND RECOLLECT TO VERIFY   < > 13 12 8  7*  CALCIUM 9.8 9.4   < > QUESTIONABLE RESULTS, RECOMMEND RECOLLECT TO VERIFY   < > 8.7* 8.4* 8.6* 8.6*  CREATININE 1.04 0.98   < > QUESTIONABLE RESULTS, RECOMMEND RECOLLECT TO VERIFY   < > 0.65 0.68 0.68 0.68  GFRNONAA >60 81   < > QUESTIONABLE RESULTS, RECOMMEND RECOLLECT TO VERIFY   < > >60 >60 >60 >60  GFRAA >60 94  --  QUESTIONABLE RESULTS, RECOMMEND RECOLLECT TO VERIFY  --   --   --   --   --    < > = values in this interval not displayed.    LIVER FUNCTION TESTS: Recent Labs    10/18/20 0734 10/22/20 0326 10/27/20 1053 10/29/20 0706  BILITOT 1.0 0.8 0.8 0.9  AST 61* 37 39 27  ALT 136* 79* 57* 45*  ALKPHOS 129* 116 95 84  PROT 6.3* 5.6* 5.7* 5.9*  ALBUMIN 2.8* 2.5* 2.9* 2.9*    Assessment and Plan:  S/p Right lower extremity venogram, IVCgram and mechanical thrombectomy of inferior vena cava, right internal and external iliac, common femoral, and femoral veins, balloon angioplasty of the right femoral and common femoral veins on 5/5 with Dr. Elby Showers  Patient stable.  Right popliteal and IJ puncture sites stable.  Left femoral puncture site was minimally hardened, discussed with Dr. Elby Showers, possible venous hematoma after procedure on 5/2, no intervention needed.  No complaints on bilateral legs.   Patient was not placed on compression stocking nor  SCDs overnight.  RN informed, patient to be placed on bilateral thigh high compression hose and SCDs at all times unless ambulating. Continue anticoagulation.  Right popliteal suture to be removed tomorrow.   Follow up at the clinic in 2 month with CTV abdomen/pelvis.  Outpatient ordered.   Electronically Signed: Willette Brace, PA-C 11/03/2020, 12:47 PM  I spent a total of 25 Minutes at the the patient's bedside AND on the patient's hospital floor or unit, greater than 50% of which was counseling/coordinating care for mechanical thrombectomy and balloon angioplasty of right lower leg veins

## 2020-11-03 NOTE — Progress Notes (Signed)
Physical Therapy Session Note  Patient Details  Name: Walter [name] MRN: [id number] Date of Birth: 01/19/1956  Today's Date: 11/03/2020 PT Individual Time: 0905-1015 PT Individual Time Calculation (min): 75 min   Short Term Goals: Week 1:  PT Short Term Goal 1 (Week 1): Pt will consistently perform supine<>sit with mod assist PT Short Term Goal 2 (Week 1): Pt will perform sit<>stands using LRAD with +2 mod assist PT Short Term Goal 3 (Week 1): Pt will perform bed<>chair transfers using LRAD with +2 mod assist PT Short Term Goal 4 (Week 1): Pt will tolerate sitting upright, OOB in TIS w/c for at least 2 hours outside of therapy sessions PT Short Term Goal 5 (Week 1): Pt will ambulate at least 108ft using LRAD with +2 mod assist  Skilled Therapeutic Interventions/Progress Updates:  Patient supine in bed on entrance to room. Patient alert and agreeable to PT session. MD has ordered SCD for BLE to be used while in bed instead of TED hose, however, SCD not present in room yet. Pt's personal compression socks donned with Total A for OOB activity during session.  Patient denied pain throughout session.  Therapeutic Activity: Bed Mobility: Patient performed supine --> sit EOB requiring extra time and supervision. Once seated EOB, pt tending to lean to L and forward requiring vc/ tc for midline orienting and pt able to follow instructions to midline. At end of session, pt returns to supine with CGA for BLE and vc for initiating and technique.  Transfers: Patient performed STS w/c to EVA walker throughout session with Min A. Performed SPVT transfers throughout session with BUE support at therapist's elbows and completes with Min A. VC for initiation and positioning.   Gait Training:  Patient ambulated 84' x1 using EVA walker with Mod/ max A and +2 for w/c and air canister follow. Demonstrated very NBOS with some crossover stepping, catching toe on contralateral ankle, low step height, variable  management of walker, some posterior weight shifting. Provided vc/ tc for maintaining weight over feet, increasing width of step placement. After NMR training, pt guided in ambulation again using EVA walker. Pt demos some improvement in step width especially for first half of distance. Pt ambulates 39' x1 with EVA walker and +2 for w/c follow and step correction.     Neuromuscular Re-ed: NMR facilitated during session with focus on standing balance and lateral step technique. Pt guided in standing balance attainment with +2 for safety. BUE to therapist's BUE and another bout standing at EVA walker. Pt unable to maintain balance without vc/ tc and Min A for maintaining. Pt also guided in lateral stepping along HR in hallway for 15' x2 in each direction. Engages G. Med. And motor processing/ control for smooth abd/ add movement. NMR performed for improvements in motor control and coordination, balance, sequencing, judgement, and self confidence/ efficacy in performing all aspects of mobility at highest level of independence.   Patient supine in bed at end of session with brakes locked, bed alarm set, and all needs within reach.    Therapy Documentation Precautions:  Precautions Precautions: Fall,Other (comment) Precaution Comments: NG tube, trach, PMSV, R hemiparesis, hx of DVTs and PEs Restrictions Weight Bearing Restrictions: No  Vital Signs: Therapy Vitals Pulse Rate: 88 Resp: 18 Oxygen Therapy SpO2: 96 % O2 Device: Tracheostomy Collar O2 Flow Rate (L/min): 5 L/min FiO2 (%): 28 %  Therapy/Group: Individual Therapy  Loel Dubonnet  PT, DPT 11/03/2020, 10:25 AM

## 2020-11-04 DIAGNOSIS — E440 Moderate protein-calorie malnutrition: Secondary | ICD-10-CM | POA: Insufficient documentation

## 2020-11-04 LAB — GLUCOSE, CAPILLARY
Glucose-Capillary: 110 mg/dL — ABNORMAL HIGH (ref 70–99)
Glucose-Capillary: 110 mg/dL — ABNORMAL HIGH (ref 70–99)
Glucose-Capillary: 124 mg/dL — ABNORMAL HIGH (ref 70–99)
Glucose-Capillary: 128 mg/dL — ABNORMAL HIGH (ref 70–99)
Glucose-Capillary: 133 mg/dL — ABNORMAL HIGH (ref 70–99)
Glucose-Capillary: 134 mg/dL — ABNORMAL HIGH (ref 70–99)

## 2020-11-04 MED ORDER — TRAZODONE HCL 50 MG PO TABS
50.0000 mg | ORAL_TABLET | Freq: Every day | ORAL | Status: DC
  Administered 2020-11-04: 50 mg via ORAL
  Filled 2020-11-04: qty 1

## 2020-11-04 NOTE — Progress Notes (Signed)
Occupational Therapy Session Note  Patient Details  Name: Walter [name] MRN: [id number] Date of Birth: 11-Mar-1956  Today's Date: 11/04/2020 OT Individual Time: 0071-2197 OT Individual Time Calculation (min): 57 min    Short Term Goals: Week 1:  OT Short Term Goal 1 (Week 1): Pt will complete BSC or toilet transfer with 1 assist and LRAD OT Short Term Goal 2 (Week 1): Pt will engage in self care or therapeutic activity while sitting unsupported for ~20 minutes with no more than Mod balance assistance OT Short Term Goal 3 (Week 1): Pt will be able to visually scan for 1 ADL item and successfully reach for it with no more than Mod A  Skilled Therapeutic Interventions/Progress Updates:    Pt received semi-reclined in bed, denies pain throughout session, agreeable to therapy. Session focus on BUE gross motor coordination and strengthening in prep for improved ADL performance. Came to sitting EOB with min A to lift trunk and min VCs to guide through log-roll technique. Maintained static sitting balance with intermittant mod A 2/2 posterior lean. Donned pants with overall min A to thread BLE and for balance during standing. Stedy transfer to toilet with CGA to power up, min A for LB clothing management. Cont void of bowel and bladder, pt able to complete pericare seated close S. Seated at [facility], played 2 bouts of single target game with BUE. Req steadying assist of BUE to accurately hit target, but otherwise no difficulty locating target on screen. Cont to reports diplopia then BUE weakness is his main limiting factor, however, declining at this time again to try eye patch/taping glasses. Elects to close one eye. Finally, completed 1x15 of B shoulder flexion and hold with 2 lb dowel rod with good control. Stand-pivot back to bed with mod A for balance. Returned to supine min A to readjust BLE in bed and to guide technique. SatO2 at high 90s-100% throughout session on 4L via trach.   Pt left semi-reclined  in bed with MD present, telesitter on, SCDs palced, bed alarm engaged, call bell in reach, and all immediate needs met.    Therapy Documentation Precautions:  Precautions Precautions: Fall,Other (comment) Precaution Comments: NG tube, trach, PMSV, R hemiparesis, hx of DVTs and PEs Restrictions Weight Bearing Restrictions: No Pain: Pain Assessment Pain Scale: 0-10 Pain Score: 0-No pain ADL: See Care Tool for more details.   Therapy/Group: Individual Therapy  [name] MS, OTR/L  11/04/2020, 12:31 PM

## 2020-11-04 NOTE — Progress Notes (Signed)
Speech Language Pathology Daily Session Note  Patient Details  Name: Walter [name] MRN: [id number] Date of Birth: 15-Sep-1955  Today's Date: 11/04/2020 SLP Individual Time: 6948-5462 SLP Individual Time Calculation (min): 49 min  Short Term Goals: Week 1: SLP Short Term Goal 1 (Week 1): Patient will utilize an increased vocal intensity at the phrase leve to achieve ~50% intelligibility with Mod verbal cues. SLP Short Term Goal 2 (Week 1): Patient will consume current diet with minimal overt s/s of aspiration with Mod verbal cues for use of swallowing compensatory strategies. SLP Short Term Goal 3 (Week 1): Patient will demonstrate sustained attention to functional tasks for 15 minutes with Mod verbal cues for redirection. SLP Short Term Goal 4 (Week 1): Patient will utilize external aids and contextual cues for orientation with Mod multimodal cues. SLP Short Term Goal 5 (Week 1): Patient will demonstrate functional problem solving for basic and familiar tasks with Max A multimodal cues.  Skilled Therapeutic Interventions: Pt seen for skilled ST with focus on dysphagia and cognitive goals, PMSV in place with nursing present when SLP entered. Pt agreeable to regular solid trials with Ensure from RN. Pt noted to be very impulsive with intake of Ensure, requiring verbal cues before every sip to reduce bolus size. Pt requiring moderate A verbal cues to reduce impulsive intake of regular solids trials, one episode large cough following large bite. Pt endorsing occasional globus sensation during trials, states he has a significant GI history. Remain Dys 3/thin at this time 2' ongoing impulsivity and poor self monitoring. SLP facilitating simple divided attention task by providing mod A cues for 75% accuracy. Pt did not demonstrate any language of confusion this date, sustained attention averaged about 8 minutes. RN assisting with removal of PMSV. Pt left in bed with alarm set and all needs within reach.  Cont ST POC.   Pain Pain Assessment Pain Scale: 0-10 Pain Score: 0-No pain  Therapy/Group: Individual Therapy  Tacey Ruiz 11/04/2020, 11:11 AM

## 2020-11-04 NOTE — Progress Notes (Addendum)
PROGRESS NOTE   Subjective/Complaints:  Pt reports needs to have BM- denies pain- no other issues.  Per IR, needs to have sutures removed from R popliteal area- will place nursing order.    ROS-  Pt denies SOB, abd pain, CP, N/V/C/D, and vision changes   Objective:   IR Veno/Ext/Uni Right  Result Date: 11/03/2020 INDICATION: 65 year old male with history of recent hemorrhagic stroke status post IVC filter placement on 10/08/2020 with subsequent propagation of right greater than left lower extremity deep vein thrombosis with ileocaval involvement and near occlusion of the inferior vena cava. Status post suction thrombectomy on 10/27/2020 with inadequate clearance of iliocaval thrombus. EXAM: 1. Ultrasound-guided venous access of the right popliteal vein. 2. Ultrasound-guided venous access of the right internal jugular vein. 3. Right lower extremity venogram. 4. Inferior vena cavagram. 5. Intravascular ultrasound. 6. Mechanical thrombectomy of the inferior vena cava, right internal and external iliac, common femoral, and femoral veins. 7. Suction thrombectomy of the inferior vena cava, right internal and external iliac, common femoral, and femoral veins. 8. Balloon angioplasty of the right femoral and common femoral veins. COMPARISON:  10/26/2020, 10/27/2020 MEDICATIONS: 13000 units heparin, intravenous ANESTHESIA/SEDATION: Versed 5 mg IV; Fentanyl 250 mcg IV Moderate Sedation Time:  2 hours, 45 minutes The patient was continuously monitored during the procedure by the interventional radiology nurse under my direct supervision. FLUOROSCOPY TIME:  Fluoroscopy Time: 25 minutes 18 seconds (81 mGy). CONTRAST:  75 mL Omnipaque 300, intravenous COMPLICATIONS: None immediate. TECHNIQUE: Informed written consent was obtained from the patient after a thorough discussion of the procedural risks, benefits and alternatives. All questions were addressed.  Maximal Sterile Barrier Technique was utilized including caps, mask, sterile gowns, sterile gloves, sterile drape, hand hygiene and skin antiseptic. A timeout was performed prior to the initiation of the procedure. The bilateral lower extremities and right neck were prepped and draped in standard fashion. Under direct ultrasound visualization, the distal right popliteal vein was identified with the patient's leg in frog-leg position which demonstrated mixed acute and subacute thrombus. Subdermal local anesthesia was administered at the planned needle entry site. A skin nick was made. Under direct ultrasound visualization, the distal popliteal vein was accessed with a 21 gauge micropuncture needle. A 0.018 inch Nitrex wire was then directed into the femoral vein with minimal resistance. A micropuncture sheath was placed. Serial dilation was performed and an 8 [place], 11 cm vascular sheath was placed. A Glidewire was directed into the suprarenal inferior vena cava and exchanged over a 5 French catheter for a superstiff exchange length Amplatz wire. Right lower extremity venogram was then performed with hand injection. Venogram was significant for nearly occlusive thrombus throughout the visualized femoral vein and common femoral vein. There was sluggish flow through the external and common iliac veins into the inferior vena cava where there is peripheral flow around large residual thrombus burden within and inferior to the indwelling inferior vena cava. Intravascular ultrasound was performed which demonstrated similar findings to venogram: Echogenic, expansile thrombus throughout the visualized femoral vein into the common femoral vein, patent external iliac vein, wall adherent echogenic thrombus within the common iliac vein, patent inferior aspect of the inferior vena cava  and multifocal echogenic thrombus within the midportion of the infrarenal inferior vena cava extending into the indwelling inferior vena cava  filter which is well positioned. The IVC is widely patent superior to the filter. Next, the internal jugular vein was assessed with ultrasound and was compressible and free of internal echoes. Subdermal Local anesthesia was administered 1% lidocaine at the planned needle entry site. A skin nick was made. Under direct ultrasound visualization, a 21 gauge micropuncture needle was advanced into the internal jugular vein and a micropuncture set was placed. Through this, a Wholey wire was directed to the inferior vena cava. Serial dilation was performed and a 16 French, 70 cm sheath was placed, directed under direct fluoroscopic guidance through the indwelling inferior vena cava filter. Then, from the thigh lower extremity access, an Amplatz wire was directed into the distal aspect of the sheath lumen and through and through flossing access was established. The indwelling 8 French sheath was then exchanged for a 16 [place] ClotTriever sheath after serial dilation under fluoroscopic guidance. The ClotTriever catheter was then advanced into the 16 French IJ sheath, the coring element was exposed, and mechanical thrombectomy was performed from the inferior vena cava through the femoral vein. A total of 6 passes with this technique were performed yielding large volume of acute and chronic appearing thrombus. Repeat venogram was performed through the indwelling popliteal sheath which demonstrated significantly improved patency inflow of the femoral vein and inferior vena cava. There is significant flow-limiting focal stenosis in the mid femoral vein and at the saphenofemoral junction. Intravascular ultrasound was then again performed which demonstrated significantly improved patency of the femoral vein with the exception of echogenic wall adherent thrombus in the mid femoral vein in addition to at the saphenofemoral junction. There is persistent echogenic thrombus just inferior to the indwelling inferior vena cava filter,  decreased from comparison. Balloon angioplasty was then performed in the mid femoral vein and at the saphenofemoral junction with a 12 mm by 4 cm atlas balloon. Repeat venogram demonstrated minimally improved patency at the 2 stenotic areas. Therefore, 2 additional passes with the ClotTriever device were performed from the common femoral vein through the femoral vein yielding small volume chronic appearing thrombus. Repeat intravascular ultrasound and venogram demonstrated persistent flow limiting focal stenosis at the saphenofemoral junction and persistent chronic appearing thrombus within the inferior vena cava, just inferior to the indwelling filter. Therefore, the 45 [place] FlowTriever device was used to perform suction thrombectomy within the inferior aspect of the filter as well as in the femoral vein at multiple sites. Multiple acute on chronic thrombi were aspirated from the inferior vena cava. There is minimal yield in the femoral vein. Repeat intravascular ultrasound venogram demonstrated persistent flow limiting stenosis at the saphenofemoral junction and improved thrombus burden in the inferior vena cava. Therefore, repeat balloon angioplasty of saphenofemoral junction and in the mid femoral vein was performed with a 14 mm x 4 cm atlas balloon with 1 minute inflation times. Completion venography and intravascular ultrasound was then performed. There is significantly improved patency of the mid femoral vein focal stenosis. There is persistent chronic appearing thrombus but improved patency at the saphenofemoral junction. The distal aspect of the inferior vena cava is widely patent. There is persistent small volume chronic appearing thrombus just inferior to the indwelling inferior vena cava filter. The superior aspect of the inferior vena cava remains widely patent. The catheters and sheath were then removed. A per string stitch was placed at the popliteal access site.  Manual compression was applied at  both sites for approximately 5 minutes and hemostasis was achieved. The patient tolerated the procedure well and was returned to the floor in stable condition. FINDINGS: Severe mixed acute and chronic deep vein thrombosis extending from the right calf to the indwelling inferior vena cava. Technically successful mechanical thrombectomy, suction thrombectomy, and balloon angioplasty. Small volume residual IVC and right femoral chronic appearing thrombus, most prominent at the saphenofemoral junction. IMPRESSION: 1. Severe mixed acute and chronic deep vein thrombosis extending from the right calf to the indwelling inferior vena cava. 2. Technically successful mechanical thrombectomy, suction thrombectomy, and balloon angioplasty from the right femoral vein to the inferior vena cava. 3. Small volume residual inferior vena cava and right femoral chronic appearing thrombus. PLAN: 1. Bilateral thigh high compression stockings (20-30 mmHg) at all times. Continue compression stocking use after discharge. 2. Bilateral SCDs at all times unless ambulating. 3. Continue anticoagulation. 4. Follow up in IR clinic in 2 months with CTV abdomen/pelvis. Marliss Coots, MD [facility] Radiology Electronically Signed   By: Marliss Coots MD   On: 11/03/2020 08:38   IR [facility] MOD SED  Result Date: 11/03/2020 INDICATION: 65 year old male with history of recent hemorrhagic stroke status post IVC filter placement on 10/08/2020 with subsequent propagation of right greater than left lower extremity deep vein thrombosis with ileocaval involvement and near occlusion of the inferior vena cava. Status post suction thrombectomy on 10/27/2020 with inadequate clearance of iliocaval thrombus. EXAM: 1. Ultrasound-guided venous access of the right popliteal vein. 2. Ultrasound-guided venous access of the right internal jugular vein. 3. Right lower extremity venogram. 4. Inferior vena  cavagram. 5. Intravascular ultrasound. 6. Mechanical thrombectomy of the inferior vena cava, right internal and external iliac, common femoral, and femoral veins. 7. Suction thrombectomy of the inferior vena cava, right internal and external iliac, common femoral, and femoral veins. 8. Balloon angioplasty of the right femoral and common femoral veins. COMPARISON:  10/26/2020, 10/27/2020 MEDICATIONS: 13000 units heparin, intravenous ANESTHESIA/SEDATION: Versed 5 mg IV; Fentanyl 250 mcg IV Moderate Sedation Time:  2 hours, 45 minutes The patient was continuously monitored during the procedure by the interventional radiology nurse under my direct supervision. FLUOROSCOPY TIME:  Fluoroscopy Time: 25 minutes 18 seconds (81 mGy). CONTRAST:  75 mL Omnipaque 300, intravenous COMPLICATIONS: None immediate. TECHNIQUE: Informed written consent was obtained from the patient after a thorough discussion of the procedural risks, benefits and alternatives. All questions were addressed. Maximal Sterile Barrier Technique was utilized including caps, mask, sterile gowns, sterile gloves, sterile drape, hand hygiene and skin antiseptic. A timeout was performed prior to the initiation of the procedure. The bilateral lower extremities and right neck were prepped and draped in standard fashion. Under direct ultrasound visualization, the distal right popliteal vein was identified with the patient's leg in frog-leg position which demonstrated mixed acute and subacute thrombus. Subdermal local anesthesia was administered at the planned needle entry site. A skin nick was made. Under direct ultrasound visualization, the distal popliteal vein was accessed with a 21 gauge micropuncture needle. A 0.018 inch Nitrex wire was then directed into the femoral vein with minimal resistance. A micropuncture sheath was placed. Serial dilation was performed and an 8 [place], 11 cm vascular sheath was placed. A Glidewire was directed into the suprarenal  inferior vena cava and exchanged over a 5 French catheter for a superstiff exchange length Amplatz wire. Right lower extremity venogram was then performed with hand injection. Venogram was  significant for nearly occlusive thrombus throughout the visualized femoral vein and common femoral vein. There was sluggish flow through the external and common iliac veins into the inferior vena cava where there is peripheral flow around large residual thrombus burden within and inferior to the indwelling inferior vena cava. Intravascular ultrasound was performed which demonstrated similar findings to venogram: Echogenic, expansile thrombus throughout the visualized femoral vein into the common femoral vein, patent external iliac vein, wall adherent echogenic thrombus within the common iliac vein, patent inferior aspect of the inferior vena cava and multifocal echogenic thrombus within the midportion of the infrarenal inferior vena cava extending into the indwelling inferior vena cava filter which is well positioned. The IVC is widely patent superior to the filter. Next, the internal jugular vein was assessed with ultrasound and was compressible and free of internal echoes. Subdermal Local anesthesia was administered 1% lidocaine at the planned needle entry site. A skin nick was made. Under direct ultrasound visualization, a 21 gauge micropuncture needle was advanced into the internal jugular vein and a micropuncture set was placed. Through this, a Wholey wire was directed to the inferior vena cava. Serial dilation was performed and a 16 French, 70 cm sheath was placed, directed under direct fluoroscopic guidance through the indwelling inferior vena cava filter. Then, from the thigh lower extremity access, an Amplatz wire was directed into the distal aspect of the sheath lumen and through and through flossing access was established. The indwelling 8 French sheath was then exchanged for a 16 [place] ClotTriever sheath after serial  dilation under fluoroscopic guidance. The ClotTriever catheter was then advanced into the 16 French IJ sheath, the coring element was exposed, and mechanical thrombectomy was performed from the inferior vena cava through the femoral vein. A total of 6 passes with this technique were performed yielding large volume of acute and chronic appearing thrombus. Repeat venogram was performed through the indwelling popliteal sheath which demonstrated significantly improved patency inflow of the femoral vein and inferior vena cava. There is significant flow-limiting focal stenosis in the mid femoral vein and at the saphenofemoral junction. Intravascular ultrasound was then again performed which demonstrated significantly improved patency of the femoral vein with the exception of echogenic wall adherent thrombus in the mid femoral vein in addition to at the saphenofemoral junction. There is persistent echogenic thrombus just inferior to the indwelling inferior vena cava filter, decreased from comparison. Balloon angioplasty was then performed in the mid femoral vein and at the saphenofemoral junction with a 12 mm by 4 cm atlas balloon. Repeat venogram demonstrated minimally improved patency at the 2 stenotic areas. Therefore, 2 additional passes with the ClotTriever device were performed from the common femoral vein through the femoral vein yielding small volume chronic appearing thrombus. Repeat intravascular ultrasound and venogram demonstrated persistent flow limiting focal stenosis at the saphenofemoral junction and persistent chronic appearing thrombus within the inferior vena cava, just inferior to the indwelling filter. Therefore, the 39 [place] FlowTriever device was used to perform suction thrombectomy within the inferior aspect of the filter as well as in the femoral vein at multiple sites. Multiple acute on chronic thrombi were aspirated from the inferior vena cava. There is minimal yield in the femoral vein. Repeat  intravascular ultrasound venogram demonstrated persistent flow limiting stenosis at the saphenofemoral junction and improved thrombus burden in the inferior vena cava. Therefore, repeat balloon angioplasty of saphenofemoral junction and in the mid femoral vein was performed with a 14 mm x 4 cm atlas balloon with 1  minute inflation times. Completion venography and intravascular ultrasound was then performed. There is significantly improved patency of the mid femoral vein focal stenosis. There is persistent chronic appearing thrombus but improved patency at the saphenofemoral junction. The distal aspect of the inferior vena cava is widely patent. There is persistent small volume chronic appearing thrombus just inferior to the indwelling inferior vena cava filter. The superior aspect of the inferior vena cava remains widely patent. The catheters and sheath were then removed. A per string stitch was placed at the popliteal access site. Manual compression was applied at both sites for approximately 5 minutes and hemostasis was achieved. The patient tolerated the procedure well and was returned to the floor in stable condition. FINDINGS: Severe mixed acute and chronic deep vein thrombosis extending from the right calf to the indwelling inferior vena cava. Technically successful mechanical thrombectomy, suction thrombectomy, and balloon angioplasty. Small volume residual IVC and right femoral chronic appearing thrombus, most prominent at the saphenofemoral junction. IMPRESSION: 1. Severe mixed acute and chronic deep vein thrombosis extending from the right calf to the indwelling inferior vena cava. 2. Technically successful mechanical thrombectomy, suction thrombectomy, and balloon angioplasty from the right femoral vein to the inferior vena cava. 3. Small volume residual inferior vena cava and right femoral chronic appearing thrombus. PLAN: 1. Bilateral thigh high compression stockings (20-30 mmHg) at all times. Continue  compression stocking use after discharge. 2. Bilateral SCDs at all times unless ambulating. 3. Continue anticoagulation. 4. Follow up in IR clinic in 2 months with CTV abdomen/pelvis. Marliss Coots, MD [facility] Radiology Electronically Signed   By: Marliss Coots MD   On: 11/03/2020 08:38   IR US Guide Vasc Access Right  Result Date: 11/03/2020 INDICATION: 65 year old male with history of recent hemorrhagic stroke status post IVC filter placement on 10/08/2020 with subsequent propagation of right greater than left lower extremity deep vein thrombosis with ileocaval involvement and near occlusion of the inferior vena cava. Status post suction thrombectomy on 10/27/2020 with inadequate clearance of iliocaval thrombus. EXAM: 1. Ultrasound-guided venous access of the right popliteal vein. 2. Ultrasound-guided venous access of the right internal jugular vein. 3. Right lower extremity venogram. 4. Inferior vena cavagram. 5. Intravascular ultrasound. 6. Mechanical thrombectomy of the inferior vena cava, right internal and external iliac, common femoral, and femoral veins. 7. Suction thrombectomy of the inferior vena cava, right internal and external iliac, common femoral, and femoral veins. 8. Balloon angioplasty of the right femoral and common femoral veins. COMPARISON:  10/26/2020, 10/27/2020 MEDICATIONS: 13000 units heparin, intravenous ANESTHESIA/SEDATION: Versed 5 mg IV; Fentanyl 250 mcg IV Moderate Sedation Time:  2 hours, 45 minutes The patient was continuously monitored during the procedure by the interventional radiology nurse under my direct supervision. FLUOROSCOPY TIME:  Fluoroscopy Time: 25 minutes 18 seconds (81 mGy). CONTRAST:  75 mL Omnipaque 300, intravenous COMPLICATIONS: None immediate. TECHNIQUE: Informed written consent was obtained from the patient after a thorough discussion of the procedural risks, benefits and alternatives. All questions were  addressed. Maximal Sterile Barrier Technique was utilized including caps, mask, sterile gowns, sterile gloves, sterile drape, hand hygiene and skin antiseptic. A timeout was performed prior to the initiation of the procedure. The bilateral lower extremities and right neck were prepped and draped in standard fashion. Under direct ultrasound visualization, the distal right popliteal vein was identified with the patient's leg in frog-leg position which demonstrated mixed acute and subacute thrombus. Subdermal local anesthesia was administered at the planned needle entry  site. A skin nick was made. Under direct ultrasound visualization, the distal popliteal vein was accessed with a 21 gauge micropuncture needle. A 0.018 inch Nitrex wire was then directed into the femoral vein with minimal resistance. A micropuncture sheath was placed. Serial dilation was performed and an 8 [place], 11 cm vascular sheath was placed. A Glidewire was directed into the suprarenal inferior vena cava and exchanged over a 5 French catheter for a superstiff exchange length Amplatz wire. Right lower extremity venogram was then performed with hand injection. Venogram was significant for nearly occlusive thrombus throughout the visualized femoral vein and common femoral vein. There was sluggish flow through the external and common iliac veins into the inferior vena cava where there is peripheral flow around large residual thrombus burden within and inferior to the indwelling inferior vena cava. Intravascular ultrasound was performed which demonstrated similar findings to venogram: Echogenic, expansile thrombus throughout the visualized femoral vein into the common femoral vein, patent external iliac vein, wall adherent echogenic thrombus within the common iliac vein, patent inferior aspect of the inferior vena cava and multifocal echogenic thrombus within the midportion of the infrarenal inferior vena cava extending into the indwelling inferior  vena cava filter which is well positioned. The IVC is widely patent superior to the filter. Next, the internal jugular vein was assessed with ultrasound and was compressible and free of internal echoes. Subdermal Local anesthesia was administered 1% lidocaine at the planned needle entry site. A skin nick was made. Under direct ultrasound visualization, a 21 gauge micropuncture needle was advanced into the internal jugular vein and a micropuncture set was placed. Through this, a Wholey wire was directed to the inferior vena cava. Serial dilation was performed and a 16 French, 70 cm sheath was placed, directed under direct fluoroscopic guidance through the indwelling inferior vena cava filter. Then, from the thigh lower extremity access, an Amplatz wire was directed into the distal aspect of the sheath lumen and through and through flossing access was established. The indwelling 8 French sheath was then exchanged for a 16 [place] ClotTriever sheath after serial dilation under fluoroscopic guidance. The ClotTriever catheter was then advanced into the 16 French IJ sheath, the coring element was exposed, and mechanical thrombectomy was performed from the inferior vena cava through the femoral vein. A total of 6 passes with this technique were performed yielding large volume of acute and chronic appearing thrombus. Repeat venogram was performed through the indwelling popliteal sheath which demonstrated significantly improved patency inflow of the femoral vein and inferior vena cava. There is significant flow-limiting focal stenosis in the mid femoral vein and at the saphenofemoral junction. Intravascular ultrasound was then again performed which demonstrated significantly improved patency of the femoral vein with the exception of echogenic wall adherent thrombus in the mid femoral vein in addition to at the saphenofemoral junction. There is persistent echogenic thrombus just inferior to the indwelling inferior vena cava  filter, decreased from comparison. Balloon angioplasty was then performed in the mid femoral vein and at the saphenofemoral junction with a 12 mm by 4 cm atlas balloon. Repeat venogram demonstrated minimally improved patency at the 2 stenotic areas. Therefore, 2 additional passes with the ClotTriever device were performed from the common femoral vein through the femoral vein yielding small volume chronic appearing thrombus. Repeat intravascular ultrasound and venogram demonstrated persistent flow limiting focal stenosis at the saphenofemoral junction and persistent chronic appearing thrombus within the inferior vena cava, just inferior to the indwelling filter. Therefore, the 2416 [place] FlowTriever  device was used to perform suction thrombectomy within the inferior aspect of the filter as well as in the femoral vein at multiple sites. Multiple acute on chronic thrombi were aspirated from the inferior vena cava. There is minimal yield in the femoral vein. Repeat intravascular ultrasound venogram demonstrated persistent flow limiting stenosis at the saphenofemoral junction and improved thrombus burden in the inferior vena cava. Therefore, repeat balloon angioplasty of saphenofemoral junction and in the mid femoral vein was performed with a 14 mm x 4 cm atlas balloon with 1 minute inflation times. Completion venography and intravascular ultrasound was then performed. There is significantly improved patency of the mid femoral vein focal stenosis. There is persistent chronic appearing thrombus but improved patency at the saphenofemoral junction. The distal aspect of the inferior vena cava is widely patent. There is persistent small volume chronic appearing thrombus just inferior to the indwelling inferior vena cava filter. The superior aspect of the inferior vena cava remains widely patent. The catheters and sheath were then removed. A per string stitch was placed at the popliteal access site. Manual compression was  applied at both sites for approximately 5 minutes and hemostasis was achieved. The patient tolerated the procedure well and was returned to the floor in stable condition. FINDINGS: Severe mixed acute and chronic deep vein thrombosis extending from the right calf to the indwelling inferior vena cava. Technically successful mechanical thrombectomy, suction thrombectomy, and balloon angioplasty. Small volume residual IVC and right femoral chronic appearing thrombus, most prominent at the saphenofemoral junction. IMPRESSION: 1. Severe mixed acute and chronic deep vein thrombosis extending from the right calf to the indwelling inferior vena cava. 2. Technically successful mechanical thrombectomy, suction thrombectomy, and balloon angioplasty from the right femoral vein to the inferior vena cava. 3. Small volume residual inferior vena cava and right femoral chronic appearing thrombus. PLAN: 1. Bilateral thigh high compression stockings (20-30 mmHg) at all times. Continue compression stocking use after discharge. 2. Bilateral SCDs at all times unless ambulating. 3. Continue anticoagulation. 4. Follow up in IR clinic in 2 months with CTV abdomen/pelvis. Marliss Coots, MD [facility] Radiology Electronically Signed   By: Marliss Coots MD   On: 11/03/2020 08:38   IR PTA VENOUS EXCEPT DIALYSIS CIRCUIT  Result Date: 11/03/2020 INDICATION: 65 year old male with history of recent hemorrhagic stroke status post IVC filter placement on 10/08/2020 with subsequent propagation of right greater than left lower extremity deep vein thrombosis with ileocaval involvement and near occlusion of the inferior vena cava. Status post suction thrombectomy on 10/27/2020 with inadequate clearance of iliocaval thrombus. EXAM: 1. Ultrasound-guided venous access of the right popliteal vein. 2. Ultrasound-guided venous access of the right internal jugular vein. 3. Right lower extremity venogram. 4.  Inferior vena cavagram. 5. Intravascular ultrasound. 6. Mechanical thrombectomy of the inferior vena cava, right internal and external iliac, common femoral, and femoral veins. 7. Suction thrombectomy of the inferior vena cava, right internal and external iliac, common femoral, and femoral veins. 8. Balloon angioplasty of the right femoral and common femoral veins. COMPARISON:  10/26/2020, 10/27/2020 MEDICATIONS: 13000 units heparin, intravenous ANESTHESIA/SEDATION: Versed 5 mg IV; Fentanyl 250 mcg IV Moderate Sedation Time:  2 hours, 45 minutes The patient was continuously monitored during the procedure by the interventional radiology nurse under my direct supervision. FLUOROSCOPY TIME:  Fluoroscopy Time: 25 minutes 18 seconds (81 mGy). CONTRAST:  75 mL Omnipaque 300, intravenous COMPLICATIONS: None immediate. TECHNIQUE: Informed written consent was obtained from the patient after a thorough  discussion of the procedural risks, benefits and alternatives. All questions were addressed. Maximal Sterile Barrier Technique was utilized including caps, mask, sterile gowns, sterile gloves, sterile drape, hand hygiene and skin antiseptic. A timeout was performed prior to the initiation of the procedure. The bilateral lower extremities and right neck were prepped and draped in standard fashion. Under direct ultrasound visualization, the distal right popliteal vein was identified with the patient's leg in frog-leg position which demonstrated mixed acute and subacute thrombus. Subdermal local anesthesia was administered at the planned needle entry site. A skin nick was made. Under direct ultrasound visualization, the distal popliteal vein was accessed with a 21 gauge micropuncture needle. A 0.018 inch Nitrex wire was then directed into the femoral vein with minimal resistance. A micropuncture sheath was placed. Serial dilation was performed and an 8 [place], 11 cm vascular sheath was placed. A Glidewire was directed into the  suprarenal inferior vena cava and exchanged over a 5 French catheter for a superstiff exchange length Amplatz wire. Right lower extremity venogram was then performed with hand injection. Venogram was significant for nearly occlusive thrombus throughout the visualized femoral vein and common femoral vein. There was sluggish flow through the external and common iliac veins into the inferior vena cava where there is peripheral flow around large residual thrombus burden within and inferior to the indwelling inferior vena cava. Intravascular ultrasound was performed which demonstrated similar findings to venogram: Echogenic, expansile thrombus throughout the visualized femoral vein into the common femoral vein, patent external iliac vein, wall adherent echogenic thrombus within the common iliac vein, patent inferior aspect of the inferior vena cava and multifocal echogenic thrombus within the midportion of the infrarenal inferior vena cava extending into the indwelling inferior vena cava filter which is well positioned. The IVC is widely patent superior to the filter. Next, the internal jugular vein was assessed with ultrasound and was compressible and free of internal echoes. Subdermal Local anesthesia was administered 1% lidocaine at the planned needle entry site. A skin nick was made. Under direct ultrasound visualization, a 21 gauge micropuncture needle was advanced into the internal jugular vein and a micropuncture set was placed. Through this, a Wholey wire was directed to the inferior vena cava. Serial dilation was performed and a 16 French, 70 cm sheath was placed, directed under direct fluoroscopic guidance through the indwelling inferior vena cava filter. Then, from the thigh lower extremity access, an Amplatz wire was directed into the distal aspect of the sheath lumen and through and through flossing access was established. The indwelling 8 French sheath was then exchanged for a 16 [place] ClotTriever sheath  after serial dilation under fluoroscopic guidance. The ClotTriever catheter was then advanced into the 16 French IJ sheath, the coring element was exposed, and mechanical thrombectomy was performed from the inferior vena cava through the femoral vein. A total of 6 passes with this technique were performed yielding large volume of acute and chronic appearing thrombus. Repeat venogram was performed through the indwelling popliteal sheath which demonstrated significantly improved patency inflow of the femoral vein and inferior vena cava. There is significant flow-limiting focal stenosis in the mid femoral vein and at the saphenofemoral junction. Intravascular ultrasound was then again performed which demonstrated significantly improved patency of the femoral vein with the exception of echogenic wall adherent thrombus in the mid femoral vein in addition to at the saphenofemoral junction. There is persistent echogenic thrombus just inferior to the indwelling inferior vena cava filter, decreased from comparison. Balloon angioplasty was then  performed in the mid femoral vein and at the saphenofemoral junction with a 12 mm by 4 cm atlas balloon. Repeat venogram demonstrated minimally improved patency at the 2 stenotic areas. Therefore, 2 additional passes with the ClotTriever device were performed from the common femoral vein through the femoral vein yielding small volume chronic appearing thrombus. Repeat intravascular ultrasound and venogram demonstrated persistent flow limiting focal stenosis at the saphenofemoral junction and persistent chronic appearing thrombus within the inferior vena cava, just inferior to the indwelling filter. Therefore, the 74 [place] FlowTriever device was used to perform suction thrombectomy within the inferior aspect of the filter as well as in the femoral vein at multiple sites. Multiple acute on chronic thrombi were aspirated from the inferior vena cava. There is minimal yield in the femoral  vein. Repeat intravascular ultrasound venogram demonstrated persistent flow limiting stenosis at the saphenofemoral junction and improved thrombus burden in the inferior vena cava. Therefore, repeat balloon angioplasty of saphenofemoral junction and in the mid femoral vein was performed with a 14 mm x 4 cm atlas balloon with 1 minute inflation times. Completion venography and intravascular ultrasound was then performed. There is significantly improved patency of the mid femoral vein focal stenosis. There is persistent chronic appearing thrombus but improved patency at the saphenofemoral junction. The distal aspect of the inferior vena cava is widely patent. There is persistent small volume chronic appearing thrombus just inferior to the indwelling inferior vena cava filter. The superior aspect of the inferior vena cava remains widely patent. The catheters and sheath were then removed. A per string stitch was placed at the popliteal access site. Manual compression was applied at both sites for approximately 5 minutes and hemostasis was achieved. The patient tolerated the procedure well and was returned to the floor in stable condition. FINDINGS: Severe mixed acute and chronic deep vein thrombosis extending from the right calf to the indwelling inferior vena cava. Technically successful mechanical thrombectomy, suction thrombectomy, and balloon angioplasty. Small volume residual IVC and right femoral chronic appearing thrombus, most prominent at the saphenofemoral junction. IMPRESSION: 1. Severe mixed acute and chronic deep vein thrombosis extending from the right calf to the indwelling inferior vena cava. 2. Technically successful mechanical thrombectomy, suction thrombectomy, and balloon angioplasty from the right femoral vein to the inferior vena cava. 3. Small volume residual inferior vena cava and right femoral chronic appearing thrombus. PLAN: 1. Bilateral thigh high compression stockings (20-30 mmHg) at all  times. Continue compression stocking use after discharge. 2. Bilateral SCDs at all times unless ambulating. 3. Continue anticoagulation. 4. Follow up in IR clinic in 2 months with CTV abdomen/pelvis. Marliss Coots, MD [facility] Radiology Electronically Signed   By: Marliss Coots MD   On: 11/03/2020 08:38   IR IVUS EACH ADDITIONAL NON CORONARY VESSEL  Result Date: 11/03/2020 INDICATION: 65 year old male with history of recent hemorrhagic stroke status post IVC filter placement on 10/08/2020 with subsequent propagation of right greater than left lower extremity deep vein thrombosis with ileocaval involvement and near occlusion of the inferior vena cava. Status post suction thrombectomy on 10/27/2020 with inadequate clearance of iliocaval thrombus. EXAM: 1. Ultrasound-guided venous access of the right popliteal vein. 2. Ultrasound-guided venous access of the right internal jugular vein. 3. Right lower extremity venogram. 4. Inferior vena cavagram. 5. Intravascular ultrasound. 6. Mechanical thrombectomy of the inferior vena cava, right internal and external iliac, common femoral, and femoral veins. 7. Suction thrombectomy of the inferior vena cava, right internal and external  iliac, common femoral, and femoral veins. 8. Balloon angioplasty of the right femoral and common femoral veins. COMPARISON:  10/26/2020, 10/27/2020 MEDICATIONS: 13000 units heparin, intravenous ANESTHESIA/SEDATION: Versed 5 mg IV; Fentanyl 250 mcg IV Moderate Sedation Time:  2 hours, 45 minutes The patient was continuously monitored during the procedure by the interventional radiology nurse under my direct supervision. FLUOROSCOPY TIME:  Fluoroscopy Time: 25 minutes 18 seconds (81 mGy). CONTRAST:  75 mL Omnipaque 300, intravenous COMPLICATIONS: None immediate. TECHNIQUE: Informed written consent was obtained from the patient after a thorough discussion of the procedural risks, benefits and  alternatives. All questions were addressed. Maximal Sterile Barrier Technique was utilized including caps, mask, sterile gowns, sterile gloves, sterile drape, hand hygiene and skin antiseptic. A timeout was performed prior to the initiation of the procedure. The bilateral lower extremities and right neck were prepped and draped in standard fashion. Under direct ultrasound visualization, the distal right popliteal vein was identified with the patient's leg in frog-leg position which demonstrated mixed acute and subacute thrombus. Subdermal local anesthesia was administered at the planned needle entry site. A skin nick was made. Under direct ultrasound visualization, the distal popliteal vein was accessed with a 21 gauge micropuncture needle. A 0.018 inch Nitrex wire was then directed into the femoral vein with minimal resistance. A micropuncture sheath was placed. Serial dilation was performed and an 8 [place], 11 cm vascular sheath was placed. A Glidewire was directed into the suprarenal inferior vena cava and exchanged over a 5 French catheter for a superstiff exchange length Amplatz wire. Right lower extremity venogram was then performed with hand injection. Venogram was significant for nearly occlusive thrombus throughout the visualized femoral vein and common femoral vein. There was sluggish flow through the external and common iliac veins into the inferior vena cava where there is peripheral flow around large residual thrombus burden within and inferior to the indwelling inferior vena cava. Intravascular ultrasound was performed which demonstrated similar findings to venogram: Echogenic, expansile thrombus throughout the visualized femoral vein into the common femoral vein, patent external iliac vein, wall adherent echogenic thrombus within the common iliac vein, patent inferior aspect of the inferior vena cava and multifocal echogenic thrombus within the midportion of the infrarenal inferior vena cava extending  into the indwelling inferior vena cava filter which is well positioned. The IVC is widely patent superior to the filter. Next, the internal jugular vein was assessed with ultrasound and was compressible and free of internal echoes. Subdermal Local anesthesia was administered 1% lidocaine at the planned needle entry site. A skin nick was made. Under direct ultrasound visualization, a 21 gauge micropuncture needle was advanced into the internal jugular vein and a micropuncture set was placed. Through this, a Wholey wire was directed to the inferior vena cava. Serial dilation was performed and a 16 French, 70 cm sheath was placed, directed under direct fluoroscopic guidance through the indwelling inferior vena cava filter. Then, from the thigh lower extremity access, an Amplatz wire was directed into the distal aspect of the sheath lumen and through and through flossing access was established. The indwelling 8 French sheath was then exchanged for a 16 [place] ClotTriever sheath after serial dilation under fluoroscopic guidance. The ClotTriever catheter was then advanced into the 16 French IJ sheath, the coring element was exposed, and mechanical thrombectomy was performed from the inferior vena cava through the femoral vein. A total of 6 passes with this technique were performed yielding large volume of acute and chronic appearing thrombus. Repeat venogram  was performed through the indwelling popliteal sheath which demonstrated significantly improved patency inflow of the femoral vein and inferior vena cava. There is significant flow-limiting focal stenosis in the mid femoral vein and at the saphenofemoral junction. Intravascular ultrasound was then again performed which demonstrated significantly improved patency of the femoral vein with the exception of echogenic wall adherent thrombus in the mid femoral vein in addition to at the saphenofemoral junction. There is persistent echogenic thrombus just inferior to the  indwelling inferior vena cava filter, decreased from comparison. Balloon angioplasty was then performed in the mid femoral vein and at the saphenofemoral junction with a 12 mm by 4 cm atlas balloon. Repeat venogram demonstrated minimally improved patency at the 2 stenotic areas. Therefore, 2 additional passes with the ClotTriever device were performed from the common femoral vein through the femoral vein yielding small volume chronic appearing thrombus. Repeat intravascular ultrasound and venogram demonstrated persistent flow limiting focal stenosis at the saphenofemoral junction and persistent chronic appearing thrombus within the inferior vena cava, just inferior to the indwelling filter. Therefore, the 33 [place] FlowTriever device was used to perform suction thrombectomy within the inferior aspect of the filter as well as in the femoral vein at multiple sites. Multiple acute on chronic thrombi were aspirated from the inferior vena cava. There is minimal yield in the femoral vein. Repeat intravascular ultrasound venogram demonstrated persistent flow limiting stenosis at the saphenofemoral junction and improved thrombus burden in the inferior vena cava. Therefore, repeat balloon angioplasty of saphenofemoral junction and in the mid femoral vein was performed with a 14 mm x 4 cm atlas balloon with 1 minute inflation times. Completion venography and intravascular ultrasound was then performed. There is significantly improved patency of the mid femoral vein focal stenosis. There is persistent chronic appearing thrombus but improved patency at the saphenofemoral junction. The distal aspect of the inferior vena cava is widely patent. There is persistent small volume chronic appearing thrombus just inferior to the indwelling inferior vena cava filter. The superior aspect of the inferior vena cava remains widely patent. The catheters and sheath were then removed. A per string stitch was placed at the popliteal access  site. Manual compression was applied at both sites for approximately 5 minutes and hemostasis was achieved. The patient tolerated the procedure well and was returned to the floor in stable condition. FINDINGS: Severe mixed acute and chronic deep vein thrombosis extending from the right calf to the indwelling inferior vena cava. Technically successful mechanical thrombectomy, suction thrombectomy, and balloon angioplasty. Small volume residual IVC and right femoral chronic appearing thrombus, most prominent at the saphenofemoral junction. IMPRESSION: 1. Severe mixed acute and chronic deep vein thrombosis extending from the right calf to the indwelling inferior vena cava. 2. Technically successful mechanical thrombectomy, suction thrombectomy, and balloon angioplasty from the right femoral vein to the inferior vena cava. 3. Small volume residual inferior vena cava and right femoral chronic appearing thrombus. PLAN: 1. Bilateral thigh high compression stockings (20-30 mmHg) at all times. Continue compression stocking use after discharge. 2. Bilateral SCDs at all times unless ambulating. 3. Continue anticoagulation. 4. Follow up in IR clinic in 2 months with CTV abdomen/pelvis. Marliss Coots, MD [facility] Radiology Electronically Signed   By: Marliss Coots MD   On: 11/03/2020 08:38   No results for input(s): WBC, HGB, HCT, PLT in the last 72 hours. No results for input(s): NA, K, CL, CO2, GLUCOSE, BUN, CREATININE, CALCIUM in the last 72 hours.  Intake/Output  Summary (Last 24 hours) at 11/04/2020 1020 Last data filed at 11/04/2020 0815 Gross per 24 hour  Intake 470 ml  Output 625 ml  Net -155 ml     Pressure Injury 10/21/20 Head Left;Posterior Stage 1 -  Intact skin with non-blanchable redness of a localized area usually over a bony prominence. Pink non-blanchable scalp with thinning hair on posterior left side of head (Active)  10/21/20 1100  Location: Head   Location Orientation: Left;Posterior  Staging: Stage 1 -  Intact skin with non-blanchable redness of a localized area usually over a bony prominence.  Wound Description (Comments): Pink non-blanchable scalp with thinning hair on posterior left side of head  Present on Admission: No    Physical Exam: Vital Signs Blood pressure 132/68, pulse 78, temperature 98.5 F (36.9 C), resp. rate (!) 21, weight 91.2 kg, SpO2 100 %.  ENT- trach #6 Shiley, Cortrak tube    General: awake, alert, appropriate, laying in bed; trach in place; O2 via TC;  NAD HENT: conjugate gaze; oropharynx moist CV: regular rate; no JVD Pulmonary: diffuse rhonchi- improved a little with coughing up per command; few wheezes at bases, but good air movement GI: soft, NT, ND, (+)B- hyperactive Psychiatric: appropriate Neurological: alert  Neurologic: , motor strength is 4/5 in bilateral deltoid, bicep, tricep, grip, hip flexor, knee extensors, ankle dorsiflexor and plantar flexor Sensory exam normal sensation to light touch and proprioception in bilateral upper and lower extremities Cerebellar exam dysmetria Left FNF     Musculoskeletal: Full range of motion in all 4 extremities. No joint swelling, no joint effusion R foot, No pain with ROM    Assessment/Plan: 1. Functional deficits which require 3+ hours per day of interdisciplinary therapy in a comprehensive inpatient rehab setting.  Physiatrist is providing close team supervision and 24 hour management of active medical problems listed below.  Physiatrist and rehab team continue to assess barriers to discharge/monitor patient progress toward functional and medical goals  Care Tool:  Bathing    Body parts bathed by patient: Right arm,Left arm,Chest,Abdomen,Front perineal area,Face   Body parts bathed by helper: Buttocks,Right upper leg,Left upper leg,Right lower leg,Left lower leg     Bathing assist Assist Level: 2 Helpers     Upper Body  Dressing/Undressing Upper body dressing   What is the patient wearing?: Pull over shirt    Upper body assist Assist Level: Minimal Assistance - Patient > 75%    Lower Body Dressing/Undressing Lower body dressing      What is the patient wearing?: Pants     Lower body assist Assist for lower body dressing: Moderate Assistance - Patient 50 - 74%     Toileting Toileting    Toileting assist Assist for toileting: Total Assistance - Patient < 25%     Transfers Chair/bed transfer  Transfers assist  Chair/bed transfer activity did not occur: Safety/medical concerns  Chair/bed transfer assist level: 2 Helpers     Locomotion Ambulation   Ambulation assist   Ambulation activity did not occur: Safety/medical concerns          Walk 10 feet activity   Assist  Walk 10 feet activity did not occur: Safety/medical concerns        Walk 50 feet activity   Assist Walk 50 feet with 2 turns activity did not occur: Safety/medical concerns         Walk 150 feet activity   Assist Walk 150 feet activity did not occur: Safety/medical concerns  Walk 10 feet on uneven surface  activity   Assist Walk 10 feet on uneven surfaces activity did not occur: Safety/medical concerns         Wheelchair     Assist Will patient use wheelchair at discharge?:  (TBD)             Wheelchair 50 feet with 2 turns activity    Assist            Wheelchair 150 feet activity     Assist          Blood pressure 132/68, pulse 78, temperature 98.5 F (36.9 C), resp. rate (!) 21, weight 91.2 kg, SpO2 100 %.  Medical Problem List and Plan: 1. Right side weakness and slurred speech secondary to intracerebral hemorrhage complicated by hydrocephalus. Status post EVD placed per neurosurgery -patient may not shower -ELOS/Goals: 5/26 -ccon't PT and OT 2. Acute PE/bilateral lower extremity DVT: Status post IVC  filter 10/08/2020 with thrombectomy and retrieval of IVC filter 10/28/2020 per interventional radiology (still has suprarenal filter ) No RLE, scheduled for repeat thrombectomy this week -DVT/anticoagulation: Continue Eliquis- unless this needs to be stopped for IR procedure  5/7- per IR< also needs SCDs at all times in bed- IR went over with nursing. Also, wrote order for R popliteal area to have sutures removed.   -antiplatelet therapy: N/A 3. Pain Management: Decrease oxycodone to q12H prn 4. Mood: Amantadine 200 mg twice daily, may need to reduce if pt becomes agitated  -antipsychotic agents: N/A 5. Neuropsych: This patient is not capable of making decisions on his own behalf. 6. Skin/Wound Care: Routine skin checks 7. Fluids/Electrolytes/Nutrition: Routine in and outs with follow-up chemistries 8. PAF. Status post ablation cardioversion in the Past. Continue Eliquis. Cardio exam 30 mg every 6 hours. Cardiac rate controlled 9. Acute hypoxic respiratory failure. Tracheostomy 10/04/2020 per DrChand. Presently with a #6 Shiley. PMV as tolerated. Follow-up speech therapy. Daily weights. 10. Hospital-acquired pneumonia/ventilator associated pneumonia. Infectious disease follow-up antibiotic therapy completed  5/7- a few rhonchi on exam- per pt, no SOB- has no complaints about breathing issues.  11. Dysphagia. Dysphagia #3 thin liquids.  Will need assistance with eating, reduce TF volume to improve appetite 12. Hyperglycemia related to tube feeds. Lantus insulin 6 units daily. Continue SSI. Hemoglobin A1c was 5.7  5/7- BGs 110-152- con't regimen  13.  RIght foot pain ? Tibial nerve irritation post procedure circulation is good, no MSK exam abnl 14. Insomnia  5/7- will add trazodone for sleep- 50 mg QHS   LOS: 7 days A FACE TO FACE EVALUATION WAS PERFORMED  Jaymarion Trombly 11/04/2020, 10:20 AM

## 2020-11-05 LAB — GLUCOSE, CAPILLARY
Glucose-Capillary: 106 mg/dL — ABNORMAL HIGH (ref 70–99)
Glucose-Capillary: 108 mg/dL — ABNORMAL HIGH (ref 70–99)
Glucose-Capillary: 112 mg/dL — ABNORMAL HIGH (ref 70–99)
Glucose-Capillary: 128 mg/dL — ABNORMAL HIGH (ref 70–99)
Glucose-Capillary: 134 mg/dL — ABNORMAL HIGH (ref 70–99)
Glucose-Capillary: 169 mg/dL — ABNORMAL HIGH (ref 70–99)

## 2020-11-05 MED ORDER — CLONAZEPAM 0.125 MG PO TBDP
0.1250 mg | ORAL_TABLET | Freq: Every day | ORAL | Status: DC
  Administered 2020-11-05 – 2020-11-06 (×2): 0.125 mg
  Filled 2020-11-05 (×2): qty 1

## 2020-11-05 MED ORDER — TRAZODONE HCL 50 MG PO TABS: 100.0000 mg | ORAL_TABLET | Freq: Every day | ORAL | Status: DC

## 2020-11-05 MED ORDER — TRAZODONE HCL 50 MG PO TABS
100.0000 mg | ORAL_TABLET | Freq: Every day | ORAL | Status: DC
  Administered 2020-11-05 – 2020-11-06 (×2): 100 mg
  Filled 2020-11-05 (×2): qty 2

## 2020-11-05 NOTE — Progress Notes (Signed)
Speech Language Pathology Daily Session Note  Patient Details  Name: Walter [name] MRN: [id number] Date of Birth: Nov 04, 1955  Today's Date: 11/05/2020 SLP Individual Time: 1435-1530 SLP Individual Time Calculation (min): 55 min  Short Term Goals: Week 1: SLP Short Term Goal 1 (Week 1): Patient will utilize an increased vocal intensity at the phrase leve to achieve ~50% intelligibility with Mod verbal cues. SLP Short Term Goal 2 (Week 1): Patient will consume current diet with minimal overt s/s of aspiration with Mod verbal cues for use of swallowing compensatory strategies. SLP Short Term Goal 3 (Week 1): Patient will demonstrate sustained attention to functional tasks for 15 minutes with Mod verbal cues for redirection. SLP Short Term Goal 4 (Week 1): Patient will utilize external aids and contextual cues for orientation with Mod multimodal cues. SLP Short Term Goal 5 (Week 1): Patient will demonstrate functional problem solving for basic and familiar tasks with Max A multimodal cues.  Skilled Therapeutic Interventions:  Pt was seen for skilled ST targeting goals for communication and dysphagia.  Pt was in bed, awake, alert, and agreeable to participating in treatment upon therapist's arrival.  Nursing reports that pt had a "choking episode" with eggs this morning that lasted for about an hour and required suctioning from respiratory therapy.  Pt had delayed coughing on all consistencies observed today including thin liquids as well as dys 1, 2, and 3 solids.  It is unclear whether this was secondary to baseline esophageal/GI issues per pt versus indicative of airway intrusion.  It is notable that pt has trouble controlling bolus sizes due to coordination deficits during self feeding.  Will downgrade to dys 2 textures at this time with plans to follow up with ongoing trials in future ST therapy sessions to determine need for additional diet modifications and/or testing.  When communicating, pt  needed mod assist verbal cues to achieve increased vocal intensity.  Vocal intensity was briefly but noticeably improved when pt was seated in the wheelchair versus reclined in bed.  Pt was left in chair with chair alarm set and call bell within reach.  Continue per current plan of care.     Pain Pain Assessment Pain Scale: 0-10 Pain Score: 0-No pain  Therapy/Group: Individual Therapy  Manali Mcelmurry, Melanee Spry 11/05/2020, 3:47 PM

## 2020-11-05 NOTE — Progress Notes (Signed)
PROGRESS NOTE   Subjective/Complaints:  Pt reports slept a little better- sleepy this AM  ROS-   Pt denies SOB, abd pain, CP, N/V/C/D, and vision changes  Objective:   No results found. No results for input(s): WBC, HGB, HCT, PLT in the last 72 hours. No results for input(s): NA, K, CL, CO2, GLUCOSE, BUN, CREATININE, CALCIUM in the last 72 hours.  Intake/Output Summary (Last 24 hours) at 11/05/2020 1235 Last data filed at 11/04/2020 1825 Gross per 24 hour  Intake 480 ml  Output --  Net 480 ml     Pressure Injury 10/21/20 Head Left;Posterior Stage 1 -  Intact skin with non-blanchable redness of a localized area usually over a bony prominence. Pink non-blanchable scalp with thinning hair on posterior left side of head (Active)  10/21/20 1100  Location: Head  Location Orientation: Left;Posterior  Staging: Stage 1 -  Intact skin with non-blanchable redness of a localized area usually over a bony prominence.  Wound Description (Comments): Pink non-blanchable scalp with thinning hair on posterior left side of head  Present on Admission: No    Physical Exam: Vital Signs Blood pressure 117/63, pulse 85, temperature (!) 97.5 F (36.4 C), temperature source Oral, resp. rate 16, weight 94.4 kg, SpO2 100 %.  ENT- trach #6 Shiley, Cortrak tube     General: awake, alert, appropriate, but sleepy;  NAD HENT: conjugate gaze; oropharynx moist; trach in place; O2 via TC CV: regular rate; no JVD Pulmonary: a little coarse, but otherwise sounds good; no W/R/R GI: soft, NT, ND, (+)BS Psychiatric: appropriate- delayed response Neurological: alert  Neurologic: , motor strength is 4/5 in bilateral deltoid, bicep, tricep, grip, hip flexor, knee extensors, ankle dorsiflexor and plantar flexor Sensory exam normal sensation to light touch and proprioception in bilateral upper and lower extremities Cerebellar exam dysmetria Left FNF      Musculoskeletal: Full range of motion in all 4 extremities. No joint swelling, no joint effusion R foot, No pain with ROM    Assessment/Plan: 1. Functional deficits which require 3+ hours per day of interdisciplinary therapy in a comprehensive inpatient rehab setting.  Physiatrist is providing close team supervision and 24 hour management of active medical problems listed below.  Physiatrist and rehab team continue to assess barriers to discharge/monitor patient progress toward functional and medical goals  Care Tool:  Bathing    Body parts bathed by patient: Right arm,Left arm,Chest,Abdomen,Front perineal area,Face   Body parts bathed by helper: Buttocks,Right upper leg,Left upper leg,Right lower leg,Left lower leg     Bathing assist Assist Level: 2 Helpers     Upper Body Dressing/Undressing Upper body dressing   What is the patient wearing?: Pull over shirt    Upper body assist Assist Level: Minimal Assistance - Patient > 75%    Lower Body Dressing/Undressing Lower body dressing      What is the patient wearing?: Pants     Lower body assist Assist for lower body dressing: Minimal Assistance - Patient > 75%     Toileting Toileting    Toileting assist Assist for toileting: Dependent - Patient 0% (stedy for STS, otherwise min A)     Transfers Chair/bed transfer  Transfers assist  Chair/bed transfer activity did not occur: Safety/medical concerns  Chair/bed transfer assist level: 2 Helpers     Locomotion Ambulation   Ambulation assist   Ambulation activity did not occur: Safety/medical concerns          Walk 10 feet activity   Assist  Walk 10 feet activity did not occur: Safety/medical concerns        Walk 50 feet activity   Assist Walk 50 feet with 2 turns activity did not occur: Safety/medical concerns         Walk 150 feet activity   Assist Walk 150 feet activity did not occur: Safety/medical concerns         Walk 10 feet on  uneven surface  activity   Assist Walk 10 feet on uneven surfaces activity did not occur: Safety/medical concerns         Wheelchair     Assist Will patient use wheelchair at discharge?:  (TBD)             Wheelchair 50 feet with 2 turns activity    Assist            Wheelchair 150 feet activity     Assist          Blood pressure 117/63, pulse 85, temperature (!) 97.5 F (36.4 C), temperature source Oral, resp. rate 16, weight 94.4 kg, SpO2 100 %.  Medical Problem List and Plan: 1. Right side weakness and slurred speech secondary to intracerebral hemorrhage complicated by hydrocephalus. Status post EVD placed per neurosurgery -patient may not shower -ELOS/Goals: 5/26 -con't PT and OT and SLP 2. Acute PE/bilateral lower extremity DVT: Status post IVC filter 10/08/2020 with thrombectomy and retrieval of IVC filter 10/28/2020 per interventional radiology (still has suprarenal filter ) No RLE, scheduled for repeat thrombectomy this week -DVT/anticoagulation: Continue Eliquis- unless this needs to be stopped for IR procedure  5/7- per IR< also needs SCDs at all times in bed- IR went over with nursing. Also, wrote order for R popliteal area to have sutures removed.    5/8- pt keeps removing them per nursing- family to try and keep them in place -antiplatelet therapy: N/A 3. Pain Management: Decrease oxycodone to q12H prn  5/8- pt denies pain- con't regimen 4. Mood: Amantadine 200 mg twice daily, may need to reduce if pt becomes agitated  -antipsychotic agents: N/A 5. Neuropsych: This patient is not capable of making decisions on his own behalf. 6. Skin/Wound Care: Routine skin checks 7. Fluids/Electrolytes/Nutrition: Routine in and outs with follow-up chemistries 8. PAF. Status post ablation cardioversion in the Past. Continue Eliquis. Cardio exam 30 mg every 6 hours. Cardiac rate  controlled 9. Acute hypoxic respiratory failure. Tracheostomy 10/04/2020 per DrChand. Presently with a #6 Shiley. PMV as tolerated. Follow-up speech therapy. Daily weights. 10. Hospital-acquired pneumonia/ventilator associated pneumonia. Infectious disease follow-up antibiotic therapy completed  5/7- a few rhonchi on exam- per pt, no SOB- has no complaints about breathing issues.  5/8- sounds good- cn't regimen  11. Dysphagia. Dysphagia #3 thin liquids.  Will need assistance with eating, reduce TF volume to improve appetite 12. Hyperglycemia related to tube feeds. Lantus insulin 6 units daily. Continue SSI. Hemoglobin A1c was 5.7  5/7- BGs 110-152- con't regimen  13.  RIght foot pain ? Tibial nerve irritation post procedure circulation is good, no MSK exam abnl 14. Insomnia  5/7- will add trazodone for sleep- 50 mg QHS  5/8- increase to 100 mg QHS   LOS:  8 days A FACE TO FACE EVALUATION WAS PERFORMED  Walter [name] 11/05/2020, 12:35 PM

## 2020-11-05 NOTE — Progress Notes (Signed)
Referring Physician(s): Dr Nunzio Cory  Supervising Physician: Malachy Moan  Patient Status:  [facility] - In-pt  Chief Complaint:  S/p Right lower extremity venogram, IVCgram and mechanical thrombectomy of inferior vena cava, right internal and external iliac, common femoral, and femoral veins, balloon angioplasty of the right femoral and common femoral veins on 5/5 with Dr. Elby Showers  Subjective:  Up in bed Doing well No complaints No sutures noted at posterior Rt knee site Legs looking symmetrical today Not wearing stockings--- pt keeps taking them off per RN  Allergies: Keppra [levetiracetam] and Latex  Medications: Prior to Admission medications   Medication Sig Start Date End Date Taking? Authorizing Provider  amantadine (SYMMETREL) 50 MG/5ML solution Place 20 mLs (200 mg total) into feeding tube 2 (two) times daily. 10/28/20   Mikhail, Nita Sells, DO  apixaban (ELIQUIS) 5 MG TABS tablet Place 1 tablet (5 mg total) into feeding tube 2 (two) times daily. 10/28/20   Edsel Petrin, DO  cholecalciferol (VITAMIN D3) 25 MCG (1000 UNIT) tablet Take 1,000 Units by mouth daily.    [provider]  diltiazem (CARDIZEM) 10 mg/ml oral suspension Place 3 mLs (30 mg total) into feeding tube every 6 (six) hours. 10/28/20   Mikhail, Nita Sells, DO  doxazosin (CARDURA) 2 MG tablet Place 1 tablet (2 mg total) into feeding tube daily. 10/28/20   Mikhail, Nita Sells, DO  EPINEPHrine 0.3 mg/0.3 mL IJ SOAJ injection Inject 0.3 mg into the muscle once as needed for anaphylaxis. 02/07/20   [provider]  feeding supplement (ENSURE ENLIVE / ENSURE PLUS) LIQD Take 237 mLs by mouth 2 (two) times daily between meals. 10/28/20   Mikhail, Nita Sells, DO  guaiFENesin-dextromethorphan (ROBITUSSIN DM) 100-10 MG/5ML syrup Place 5 mLs into feeding tube every 4 (four) hours as needed for cough. 10/28/20   Mikhail, Nita Sells, DO  insulin aspart (NOVOLOG) 100 UNIT/ML injection Inject 0-20 Units into the skin  every 4 (four) hours. 10/28/20   Mikhail, Nita Sells, DO  insulin glargine (LANTUS) 100 UNIT/ML injection Inject 0.06 mLs (6 Units total) into the skin daily. 10/28/20   Mikhail, Nita Sells, DO  Nutritional Supplements (FEEDING SUPPLEMENT, PROSOURCE TF,) liquid Place 45 mLs into feeding tube 3 (three) times daily. 10/28/20   Mikhail, Nita Sells, DO  Nutritional Supplements (FEEDING SUPPLEMENT, VITAL 1.5 CAL,) LIQD Place 960 mLs into feeding tube daily. 10/28/20   Mikhail, Nita Sells, DO  oxyCODONE (OXY IR/ROXICODONE) 5 MG immediate release tablet Place 1 tablet (5 mg total) into feeding tube every 6 (six) hours as needed (RASS goal 0). 10/28/20   Edsel Petrin, DO  QUEtiapine (SEROQUEL) 25 MG tablet Take 0.5 tablets (12.5 mg total) by mouth at bedtime. 10/28/20   Edsel Petrin, DO  Water For Irrigation, Sterile (FREE WATER) SOLN Place 200 mLs into feeding tube every 4 (four) hours. 10/28/20   Edsel Petrin, DO     Vital Signs: BP 117/63 (BP Location: Right Arm)   Pulse 89   Temp (!) 97.5 F (36.4 C) (Oral)   Resp (!) 25   Wt 208 lb 1.8 oz (94.4 kg)   SpO2 100%   BMI 26.72 kg/m   Physical Exam Vitals reviewed.  Musculoskeletal:        General: Normal range of motion.     Right lower leg: No edema.     Left lower leg: No edema.     Comments: Legs seem symmetrical at this point Rt posterior knee-- no suture noted Clean and dry NT no bleeding - no hematoma Rt foot  2+ pulses    Skin:    General: Skin is warm.  Neurological:     Comments: Pleasantly confused RN reports he is taking off stockings daily Pulling at lines and NG today     Imaging: IR Veno/Ext/Uni Right  Result Date: 11/03/2020 INDICATION: 65 year old male with history of recent hemorrhagic stroke status post IVC filter placement on 10/08/2020 with subsequent propagation of right greater than left lower extremity deep vein thrombosis with ileocaval involvement and near occlusion of the inferior vena cava. Status post suction  thrombectomy on 10/27/2020 with inadequate clearance of iliocaval thrombus. EXAM: 1. Ultrasound-guided venous access of the right popliteal vein. 2. Ultrasound-guided venous access of the right internal jugular vein. 3. Right lower extremity venogram. 4. Inferior vena cavagram. 5. Intravascular ultrasound. 6. Mechanical thrombectomy of the inferior vena cava, right internal and external iliac, common femoral, and femoral veins. 7. Suction thrombectomy of the inferior vena cava, right internal and external iliac, common femoral, and femoral veins. 8. Balloon angioplasty of the right femoral and common femoral veins. COMPARISON:  10/26/2020, 10/27/2020 MEDICATIONS: 13000 units heparin, intravenous ANESTHESIA/SEDATION: Versed 5 mg IV; Fentanyl 250 mcg IV Moderate Sedation Time:  2 hours, 45 minutes The patient was continuously monitored during the procedure by the interventional radiology nurse under my direct supervision. FLUOROSCOPY TIME:  Fluoroscopy Time: 25 minutes 18 seconds (81 mGy). CONTRAST:  75 mL Omnipaque 300, intravenous COMPLICATIONS: None immediate. TECHNIQUE: Informed written consent was obtained from the patient after a thorough discussion of the procedural risks, benefits and alternatives. All questions were addressed. Maximal Sterile Barrier Technique was utilized including caps, mask, sterile gowns, sterile gloves, sterile drape, hand hygiene and skin antiseptic. A timeout was performed prior to the initiation of the procedure. The bilateral lower extremities and right neck were prepped and draped in standard fashion. Under direct ultrasound visualization, the distal right popliteal vein was identified with the patient's leg in frog-leg position which demonstrated mixed acute and subacute thrombus. Subdermal local anesthesia was administered at the planned needle entry site. A skin nick was made. Under direct ultrasound visualization, the distal popliteal vein was accessed with a 21 gauge  micropuncture needle. A 0.018 inch Nitrex wire was then directed into the femoral vein with minimal resistance. A micropuncture sheath was placed. Serial dilation was performed and an 8 [place], 11 cm vascular sheath was placed. A Glidewire was directed into the suprarenal inferior vena cava and exchanged over a 5 French catheter for a superstiff exchange length Amplatz wire. Right lower extremity venogram was then performed with hand injection. Venogram was significant for nearly occlusive thrombus throughout the visualized femoral vein and common femoral vein. There was sluggish flow through the external and common iliac veins into the inferior vena cava where there is peripheral flow around large residual thrombus burden within and inferior to the indwelling inferior vena cava. Intravascular ultrasound was performed which demonstrated similar findings to venogram: Echogenic, expansile thrombus throughout the visualized femoral vein into the common femoral vein, patent external iliac vein, wall adherent echogenic thrombus within the common iliac vein, patent inferior aspect of the inferior vena cava and multifocal echogenic thrombus within the midportion of the infrarenal inferior vena cava extending into the indwelling inferior vena cava filter which is well positioned. The IVC is widely patent superior to the filter. Next, the internal jugular vein was assessed with ultrasound and was compressible and free of internal echoes. Subdermal Local anesthesia was administered 1% lidocaine at the planned needle entry site. A skin  nick was made. Under direct ultrasound visualization, a 21 gauge micropuncture needle was advanced into the internal jugular vein and a micropuncture set was placed. Through this, a Wholey wire was directed to the inferior vena cava. Serial dilation was performed and a 16 French, 70 cm sheath was placed, directed under direct fluoroscopic guidance through the indwelling inferior vena cava  filter. Then, from the thigh lower extremity access, an Amplatz wire was directed into the distal aspect of the sheath lumen and through and through flossing access was established. The indwelling 8 French sheath was then exchanged for a 16 [place] ClotTriever sheath after serial dilation under fluoroscopic guidance. The ClotTriever catheter was then advanced into the 16 French IJ sheath, the coring element was exposed, and mechanical thrombectomy was performed from the inferior vena cava through the femoral vein. A total of 6 passes with this technique were performed yielding large volume of acute and chronic appearing thrombus. Repeat venogram was performed through the indwelling popliteal sheath which demonstrated significantly improved patency inflow of the femoral vein and inferior vena cava. There is significant flow-limiting focal stenosis in the mid femoral vein and at the saphenofemoral junction. Intravascular ultrasound was then again performed which demonstrated significantly improved patency of the femoral vein with the exception of echogenic wall adherent thrombus in the mid femoral vein in addition to at the saphenofemoral junction. There is persistent echogenic thrombus just inferior to the indwelling inferior vena cava filter, decreased from comparison. Balloon angioplasty was then performed in the mid femoral vein and at the saphenofemoral junction with a 12 mm by 4 cm atlas balloon. Repeat venogram demonstrated minimally improved patency at the 2 stenotic areas. Therefore, 2 additional passes with the ClotTriever device were performed from the common femoral vein through the femoral vein yielding small volume chronic appearing thrombus. Repeat intravascular ultrasound and venogram demonstrated persistent flow limiting focal stenosis at the saphenofemoral junction and persistent chronic appearing thrombus within the inferior vena cava, just inferior to the indwelling filter. Therefore, the 73 [place]  FlowTriever device was used to perform suction thrombectomy within the inferior aspect of the filter as well as in the femoral vein at multiple sites. Multiple acute on chronic thrombi were aspirated from the inferior vena cava. There is minimal yield in the femoral vein. Repeat intravascular ultrasound venogram demonstrated persistent flow limiting stenosis at the saphenofemoral junction and improved thrombus burden in the inferior vena cava. Therefore, repeat balloon angioplasty of saphenofemoral junction and in the mid femoral vein was performed with a 14 mm x 4 cm atlas balloon with 1 minute inflation times. Completion venography and intravascular ultrasound was then performed. There is significantly improved patency of the mid femoral vein focal stenosis. There is persistent chronic appearing thrombus but improved patency at the saphenofemoral junction. The distal aspect of the inferior vena cava is widely patent. There is persistent small volume chronic appearing thrombus just inferior to the indwelling inferior vena cava filter. The superior aspect of the inferior vena cava remains widely patent. The catheters and sheath were then removed. A per string stitch was placed at the popliteal access site. Manual compression was applied at both sites for approximately 5 minutes and hemostasis was achieved. The patient tolerated the procedure well and was returned to the floor in stable condition. FINDINGS: Severe mixed acute and chronic deep vein thrombosis extending from the right calf to the indwelling inferior vena cava. Technically successful mechanical thrombectomy, suction thrombectomy, and balloon angioplasty. Small volume residual IVC and right femoral  chronic appearing thrombus, most prominent at the saphenofemoral junction. IMPRESSION: 1. Severe mixed acute and chronic deep vein thrombosis extending from the right calf to the indwelling inferior vena cava. 2. Technically successful mechanical thrombectomy,  suction thrombectomy, and balloon angioplasty from the right femoral vein to the inferior vena cava. 3. Small volume residual inferior vena cava and right femoral chronic appearing thrombus. PLAN: 1. Bilateral thigh high compression stockings (20-30 mmHg) at all times. Continue compression stocking use after discharge. 2. Bilateral SCDs at all times unless ambulating. 3. Continue anticoagulation. 4. Follow up in IR clinic in 2 months with CTV abdomen/pelvis. Marliss Coots, MD [facility] Radiology Electronically Signed   By: Marliss Coots MD   On: 11/03/2020 08:38   IR [facility] MOD SED  Result Date: 11/03/2020 INDICATION: 65 year old male with history of recent hemorrhagic stroke status post IVC filter placement on 10/08/2020 with subsequent propagation of right greater than left lower extremity deep vein thrombosis with ileocaval involvement and near occlusion of the inferior vena cava. Status post suction thrombectomy on 10/27/2020 with inadequate clearance of iliocaval thrombus. EXAM: 1. Ultrasound-guided venous access of the right popliteal vein. 2. Ultrasound-guided venous access of the right internal jugular vein. 3. Right lower extremity venogram. 4. Inferior vena cavagram. 5. Intravascular ultrasound. 6. Mechanical thrombectomy of the inferior vena cava, right internal and external iliac, common femoral, and femoral veins. 7. Suction thrombectomy of the inferior vena cava, right internal and external iliac, common femoral, and femoral veins. 8. Balloon angioplasty of the right femoral and common femoral veins. COMPARISON:  10/26/2020, 10/27/2020 MEDICATIONS: 13000 units heparin, intravenous ANESTHESIA/SEDATION: Versed 5 mg IV; Fentanyl 250 mcg IV Moderate Sedation Time:  2 hours, 45 minutes The patient was continuously monitored during the procedure by the interventional radiology nurse under my direct supervision. FLUOROSCOPY TIME:  Fluoroscopy  Time: 25 minutes 18 seconds (81 mGy). CONTRAST:  75 mL Omnipaque 300, intravenous COMPLICATIONS: None immediate. TECHNIQUE: Informed written consent was obtained from the patient after a thorough discussion of the procedural risks, benefits and alternatives. All questions were addressed. Maximal Sterile Barrier Technique was utilized including caps, mask, sterile gowns, sterile gloves, sterile drape, hand hygiene and skin antiseptic. A timeout was performed prior to the initiation of the procedure. The bilateral lower extremities and right neck were prepped and draped in standard fashion. Under direct ultrasound visualization, the distal right popliteal vein was identified with the patient's leg in frog-leg position which demonstrated mixed acute and subacute thrombus. Subdermal local anesthesia was administered at the planned needle entry site. A skin nick was made. Under direct ultrasound visualization, the distal popliteal vein was accessed with a 21 gauge micropuncture needle. A 0.018 inch Nitrex wire was then directed into the femoral vein with minimal resistance. A micropuncture sheath was placed. Serial dilation was performed and an 8 [place], 11 cm vascular sheath was placed. A Glidewire was directed into the suprarenal inferior vena cava and exchanged over a 5 French catheter for a superstiff exchange length Amplatz wire. Right lower extremity venogram was then performed with hand injection. Venogram was significant for nearly occlusive thrombus throughout the visualized femoral vein and common femoral vein. There was sluggish flow through the external and common iliac veins into the inferior vena cava where there is peripheral flow around large residual thrombus burden within and inferior to the indwelling inferior vena cava. Intravascular ultrasound was performed which demonstrated similar findings to venogram: Echogenic, expansile thrombus throughout the visualized femoral vein  into the common femoral  vein, patent external iliac vein, wall adherent echogenic thrombus within the common iliac vein, patent inferior aspect of the inferior vena cava and multifocal echogenic thrombus within the midportion of the infrarenal inferior vena cava extending into the indwelling inferior vena cava filter which is well positioned. The IVC is widely patent superior to the filter. Next, the internal jugular vein was assessed with ultrasound and was compressible and free of internal echoes. Subdermal Local anesthesia was administered 1% lidocaine at the planned needle entry site. A skin nick was made. Under direct ultrasound visualization, a 21 gauge micropuncture needle was advanced into the internal jugular vein and a micropuncture set was placed. Through this, a Wholey wire was directed to the inferior vena cava. Serial dilation was performed and a 16 French, 70 cm sheath was placed, directed under direct fluoroscopic guidance through the indwelling inferior vena cava filter. Then, from the thigh lower extremity access, an Amplatz wire was directed into the distal aspect of the sheath lumen and through and through flossing access was established. The indwelling 8 French sheath was then exchanged for a 16 [place] ClotTriever sheath after serial dilation under fluoroscopic guidance. The ClotTriever catheter was then advanced into the 16 French IJ sheath, the coring element was exposed, and mechanical thrombectomy was performed from the inferior vena cava through the femoral vein. A total of 6 passes with this technique were performed yielding large volume of acute and chronic appearing thrombus. Repeat venogram was performed through the indwelling popliteal sheath which demonstrated significantly improved patency inflow of the femoral vein and inferior vena cava. There is significant flow-limiting focal stenosis in the mid femoral vein and at the saphenofemoral junction. Intravascular ultrasound was then again performed which  demonstrated significantly improved patency of the femoral vein with the exception of echogenic wall adherent thrombus in the mid femoral vein in addition to at the saphenofemoral junction. There is persistent echogenic thrombus just inferior to the indwelling inferior vena cava filter, decreased from comparison. Balloon angioplasty was then performed in the mid femoral vein and at the saphenofemoral junction with a 12 mm by 4 cm atlas balloon. Repeat venogram demonstrated minimally improved patency at the 2 stenotic areas. Therefore, 2 additional passes with the ClotTriever device were performed from the common femoral vein through the femoral vein yielding small volume chronic appearing thrombus. Repeat intravascular ultrasound and venogram demonstrated persistent flow limiting focal stenosis at the saphenofemoral junction and persistent chronic appearing thrombus within the inferior vena cava, just inferior to the indwelling filter. Therefore, the 31 [place] FlowTriever device was used to perform suction thrombectomy within the inferior aspect of the filter as well as in the femoral vein at multiple sites. Multiple acute on chronic thrombi were aspirated from the inferior vena cava. There is minimal yield in the femoral vein. Repeat intravascular ultrasound venogram demonstrated persistent flow limiting stenosis at the saphenofemoral junction and improved thrombus burden in the inferior vena cava. Therefore, repeat balloon angioplasty of saphenofemoral junction and in the mid femoral vein was performed with a 14 mm x 4 cm atlas balloon with 1 minute inflation times. Completion venography and intravascular ultrasound was then performed. There is significantly improved patency of the mid femoral vein focal stenosis. There is persistent chronic appearing thrombus but improved patency at the saphenofemoral junction. The distal aspect of the inferior vena cava is widely patent. There is persistent small volume chronic  appearing thrombus just inferior to the indwelling inferior vena cava filter. The superior  aspect of the inferior vena cava remains widely patent. The catheters and sheath were then removed. A per string stitch was placed at the popliteal access site. Manual compression was applied at both sites for approximately 5 minutes and hemostasis was achieved. The patient tolerated the procedure well and was returned to the floor in stable condition. FINDINGS: Severe mixed acute and chronic deep vein thrombosis extending from the right calf to the indwelling inferior vena cava. Technically successful mechanical thrombectomy, suction thrombectomy, and balloon angioplasty. Small volume residual IVC and right femoral chronic appearing thrombus, most prominent at the saphenofemoral junction. IMPRESSION: 1. Severe mixed acute and chronic deep vein thrombosis extending from the right calf to the indwelling inferior vena cava. 2. Technically successful mechanical thrombectomy, suction thrombectomy, and balloon angioplasty from the right femoral vein to the inferior vena cava. 3. Small volume residual inferior vena cava and right femoral chronic appearing thrombus. PLAN: 1. Bilateral thigh high compression stockings (20-30 mmHg) at all times. Continue compression stocking use after discharge. 2. Bilateral SCDs at all times unless ambulating. 3. Continue anticoagulation. 4. Follow up in IR clinic in 2 months with CTV abdomen/pelvis. Marliss Coots, MD [facility] Radiology Electronically Signed   By: Marliss Coots MD   On: 11/03/2020 08:38   IR US Guide Vasc Access Right  Result Date: 11/03/2020 INDICATION: 65 year old male with history of recent hemorrhagic stroke status post IVC filter placement on 10/08/2020 with subsequent propagation of right greater than left lower extremity deep vein thrombosis with ileocaval involvement and near occlusion of the inferior vena cava. Status post  suction thrombectomy on 10/27/2020 with inadequate clearance of iliocaval thrombus. EXAM: 1. Ultrasound-guided venous access of the right popliteal vein. 2. Ultrasound-guided venous access of the right internal jugular vein. 3. Right lower extremity venogram. 4. Inferior vena cavagram. 5. Intravascular ultrasound. 6. Mechanical thrombectomy of the inferior vena cava, right internal and external iliac, common femoral, and femoral veins. 7. Suction thrombectomy of the inferior vena cava, right internal and external iliac, common femoral, and femoral veins. 8. Balloon angioplasty of the right femoral and common femoral veins. COMPARISON:  10/26/2020, 10/27/2020 MEDICATIONS: 13000 units heparin, intravenous ANESTHESIA/SEDATION: Versed 5 mg IV; Fentanyl 250 mcg IV Moderate Sedation Time:  2 hours, 45 minutes The patient was continuously monitored during the procedure by the interventional radiology nurse under my direct supervision. FLUOROSCOPY TIME:  Fluoroscopy Time: 25 minutes 18 seconds (81 mGy). CONTRAST:  75 mL Omnipaque 300, intravenous COMPLICATIONS: None immediate. TECHNIQUE: Informed written consent was obtained from the patient after a thorough discussion of the procedural risks, benefits and alternatives. All questions were addressed. Maximal Sterile Barrier Technique was utilized including caps, mask, sterile gowns, sterile gloves, sterile drape, hand hygiene and skin antiseptic. A timeout was performed prior to the initiation of the procedure. The bilateral lower extremities and right neck were prepped and draped in standard fashion. Under direct ultrasound visualization, the distal right popliteal vein was identified with the patient's leg in frog-leg position which demonstrated mixed acute and subacute thrombus. Subdermal local anesthesia was administered at the planned needle entry site. A skin nick was made. Under direct ultrasound visualization, the distal popliteal vein was accessed with a 21 gauge  micropuncture needle. A 0.018 inch Nitrex wire was then directed into the femoral vein with minimal resistance. A micropuncture sheath was placed. Serial dilation was performed and an 8 [place], 11 cm vascular sheath was placed. A Glidewire was directed into the suprarenal inferior vena cava  and exchanged over a 5 French catheter for a superstiff exchange length Amplatz wire. Right lower extremity venogram was then performed with hand injection. Venogram was significant for nearly occlusive thrombus throughout the visualized femoral vein and common femoral vein. There was sluggish flow through the external and common iliac veins into the inferior vena cava where there is peripheral flow around large residual thrombus burden within and inferior to the indwelling inferior vena cava. Intravascular ultrasound was performed which demonstrated similar findings to venogram: Echogenic, expansile thrombus throughout the visualized femoral vein into the common femoral vein, patent external iliac vein, wall adherent echogenic thrombus within the common iliac vein, patent inferior aspect of the inferior vena cava and multifocal echogenic thrombus within the midportion of the infrarenal inferior vena cava extending into the indwelling inferior vena cava filter which is well positioned. The IVC is widely patent superior to the filter. Next, the internal jugular vein was assessed with ultrasound and was compressible and free of internal echoes. Subdermal Local anesthesia was administered 1% lidocaine at the planned needle entry site. A skin nick was made. Under direct ultrasound visualization, a 21 gauge micropuncture needle was advanced into the internal jugular vein and a micropuncture set was placed. Through this, a Wholey wire was directed to the inferior vena cava. Serial dilation was performed and a 16 French, 70 cm sheath was placed, directed under direct fluoroscopic guidance through the indwelling inferior vena cava  filter. Then, from the thigh lower extremity access, an Amplatz wire was directed into the distal aspect of the sheath lumen and through and through flossing access was established. The indwelling 8 French sheath was then exchanged for a 16 [place] ClotTriever sheath after serial dilation under fluoroscopic guidance. The ClotTriever catheter was then advanced into the 16 French IJ sheath, the coring element was exposed, and mechanical thrombectomy was performed from the inferior vena cava through the femoral vein. A total of 6 passes with this technique were performed yielding large volume of acute and chronic appearing thrombus. Repeat venogram was performed through the indwelling popliteal sheath which demonstrated significantly improved patency inflow of the femoral vein and inferior vena cava. There is significant flow-limiting focal stenosis in the mid femoral vein and at the saphenofemoral junction. Intravascular ultrasound was then again performed which demonstrated significantly improved patency of the femoral vein with the exception of echogenic wall adherent thrombus in the mid femoral vein in addition to at the saphenofemoral junction. There is persistent echogenic thrombus just inferior to the indwelling inferior vena cava filter, decreased from comparison. Balloon angioplasty was then performed in the mid femoral vein and at the saphenofemoral junction with a 12 mm by 4 cm atlas balloon. Repeat venogram demonstrated minimally improved patency at the 2 stenotic areas. Therefore, 2 additional passes with the ClotTriever device were performed from the common femoral vein through the femoral vein yielding small volume chronic appearing thrombus. Repeat intravascular ultrasound and venogram demonstrated persistent flow limiting focal stenosis at the saphenofemoral junction and persistent chronic appearing thrombus within the inferior vena cava, just inferior to the indwelling filter. Therefore, the 71 [place]  FlowTriever device was used to perform suction thrombectomy within the inferior aspect of the filter as well as in the femoral vein at multiple sites. Multiple acute on chronic thrombi were aspirated from the inferior vena cava. There is minimal yield in the femoral vein. Repeat intravascular ultrasound venogram demonstrated persistent flow limiting stenosis at the saphenofemoral junction and improved thrombus burden in the inferior vena cava.  Therefore, repeat balloon angioplasty of saphenofemoral junction and in the mid femoral vein was performed with a 14 mm x 4 cm atlas balloon with 1 minute inflation times. Completion venography and intravascular ultrasound was then performed. There is significantly improved patency of the mid femoral vein focal stenosis. There is persistent chronic appearing thrombus but improved patency at the saphenofemoral junction. The distal aspect of the inferior vena cava is widely patent. There is persistent small volume chronic appearing thrombus just inferior to the indwelling inferior vena cava filter. The superior aspect of the inferior vena cava remains widely patent. The catheters and sheath were then removed. A per string stitch was placed at the popliteal access site. Manual compression was applied at both sites for approximately 5 minutes and hemostasis was achieved. The patient tolerated the procedure well and was returned to the floor in stable condition. FINDINGS: Severe mixed acute and chronic deep vein thrombosis extending from the right calf to the indwelling inferior vena cava. Technically successful mechanical thrombectomy, suction thrombectomy, and balloon angioplasty. Small volume residual IVC and right femoral chronic appearing thrombus, most prominent at the saphenofemoral junction. IMPRESSION: 1. Severe mixed acute and chronic deep vein thrombosis extending from the right calf to the indwelling inferior vena cava. 2. Technically successful mechanical thrombectomy,  suction thrombectomy, and balloon angioplasty from the right femoral vein to the inferior vena cava. 3. Small volume residual inferior vena cava and right femoral chronic appearing thrombus. PLAN: 1. Bilateral thigh high compression stockings (20-30 mmHg) at all times. Continue compression stocking use after discharge. 2. Bilateral SCDs at all times unless ambulating. 3. Continue anticoagulation. 4. Follow up in IR clinic in 2 months with CTV abdomen/pelvis. Marliss Coots, MD [facility] Radiology Electronically Signed   By: Marliss Coots MD   On: 11/03/2020 08:38   IR PTA VENOUS EXCEPT DIALYSIS CIRCUIT  Result Date: 11/03/2020 INDICATION: 65 year old male with history of recent hemorrhagic stroke status post IVC filter placement on 10/08/2020 with subsequent propagation of right greater than left lower extremity deep vein thrombosis with ileocaval involvement and near occlusion of the inferior vena cava. Status post suction thrombectomy on 10/27/2020 with inadequate clearance of iliocaval thrombus. EXAM: 1. Ultrasound-guided venous access of the right popliteal vein. 2. Ultrasound-guided venous access of the right internal jugular vein. 3. Right lower extremity venogram. 4. Inferior vena cavagram. 5. Intravascular ultrasound. 6. Mechanical thrombectomy of the inferior vena cava, right internal and external iliac, common femoral, and femoral veins. 7. Suction thrombectomy of the inferior vena cava, right internal and external iliac, common femoral, and femoral veins. 8. Balloon angioplasty of the right femoral and common femoral veins. COMPARISON:  10/26/2020, 10/27/2020 MEDICATIONS: 13000 units heparin, intravenous ANESTHESIA/SEDATION: Versed 5 mg IV; Fentanyl 250 mcg IV Moderate Sedation Time:  2 hours, 45 minutes The patient was continuously monitored during the procedure by the interventional radiology nurse under my direct supervision. FLUOROSCOPY TIME:   Fluoroscopy Time: 25 minutes 18 seconds (81 mGy). CONTRAST:  75 mL Omnipaque 300, intravenous COMPLICATIONS: None immediate. TECHNIQUE: Informed written consent was obtained from the patient after a thorough discussion of the procedural risks, benefits and alternatives. All questions were addressed. Maximal Sterile Barrier Technique was utilized including caps, mask, sterile gowns, sterile gloves, sterile drape, hand hygiene and skin antiseptic. A timeout was performed prior to the initiation of the procedure. The bilateral lower extremities and right neck were prepped and draped in standard fashion. Under direct ultrasound visualization, the distal right popliteal vein  was identified with the patient's leg in frog-leg position which demonstrated mixed acute and subacute thrombus. Subdermal local anesthesia was administered at the planned needle entry site. A skin nick was made. Under direct ultrasound visualization, the distal popliteal vein was accessed with a 21 gauge micropuncture needle. A 0.018 inch Nitrex wire was then directed into the femoral vein with minimal resistance. A micropuncture sheath was placed. Serial dilation was performed and an 8 [place], 11 cm vascular sheath was placed. A Glidewire was directed into the suprarenal inferior vena cava and exchanged over a 5 French catheter for a superstiff exchange length Amplatz wire. Right lower extremity venogram was then performed with hand injection. Venogram was significant for nearly occlusive thrombus throughout the visualized femoral vein and common femoral vein. There was sluggish flow through the external and common iliac veins into the inferior vena cava where there is peripheral flow around large residual thrombus burden within and inferior to the indwelling inferior vena cava. Intravascular ultrasound was performed which demonstrated similar findings to venogram: Echogenic, expansile thrombus throughout the visualized femoral vein into the common  femoral vein, patent external iliac vein, wall adherent echogenic thrombus within the common iliac vein, patent inferior aspect of the inferior vena cava and multifocal echogenic thrombus within the midportion of the infrarenal inferior vena cava extending into the indwelling inferior vena cava filter which is well positioned. The IVC is widely patent superior to the filter. Next, the internal jugular vein was assessed with ultrasound and was compressible and free of internal echoes. Subdermal Local anesthesia was administered 1% lidocaine at the planned needle entry site. A skin nick was made. Under direct ultrasound visualization, a 21 gauge micropuncture needle was advanced into the internal jugular vein and a micropuncture set was placed. Through this, a Wholey wire was directed to the inferior vena cava. Serial dilation was performed and a 16 French, 70 cm sheath was placed, directed under direct fluoroscopic guidance through the indwelling inferior vena cava filter. Then, from the thigh lower extremity access, an Amplatz wire was directed into the distal aspect of the sheath lumen and through and through flossing access was established. The indwelling 8 French sheath was then exchanged for a 16 [place] ClotTriever sheath after serial dilation under fluoroscopic guidance. The ClotTriever catheter was then advanced into the 16 French IJ sheath, the coring element was exposed, and mechanical thrombectomy was performed from the inferior vena cava through the femoral vein. A total of 6 passes with this technique were performed yielding large volume of acute and chronic appearing thrombus. Repeat venogram was performed through the indwelling popliteal sheath which demonstrated significantly improved patency inflow of the femoral vein and inferior vena cava. There is significant flow-limiting focal stenosis in the mid femoral vein and at the saphenofemoral junction. Intravascular ultrasound was then again performed  which demonstrated significantly improved patency of the femoral vein with the exception of echogenic wall adherent thrombus in the mid femoral vein in addition to at the saphenofemoral junction. There is persistent echogenic thrombus just inferior to the indwelling inferior vena cava filter, decreased from comparison. Balloon angioplasty was then performed in the mid femoral vein and at the saphenofemoral junction with a 12 mm by 4 cm atlas balloon. Repeat venogram demonstrated minimally improved patency at the 2 stenotic areas. Therefore, 2 additional passes with the ClotTriever device were performed from the common femoral vein through the femoral vein yielding small volume chronic appearing thrombus. Repeat intravascular ultrasound and venogram demonstrated persistent flow limiting focal  stenosis at the saphenofemoral junction and persistent chronic appearing thrombus within the inferior vena cava, just inferior to the indwelling filter. Therefore, the 57 [place] FlowTriever device was used to perform suction thrombectomy within the inferior aspect of the filter as well as in the femoral vein at multiple sites. Multiple acute on chronic thrombi were aspirated from the inferior vena cava. There is minimal yield in the femoral vein. Repeat intravascular ultrasound venogram demonstrated persistent flow limiting stenosis at the saphenofemoral junction and improved thrombus burden in the inferior vena cava. Therefore, repeat balloon angioplasty of saphenofemoral junction and in the mid femoral vein was performed with a 14 mm x 4 cm atlas balloon with 1 minute inflation times. Completion venography and intravascular ultrasound was then performed. There is significantly improved patency of the mid femoral vein focal stenosis. There is persistent chronic appearing thrombus but improved patency at the saphenofemoral junction. The distal aspect of the inferior vena cava is widely patent. There is persistent small volume  chronic appearing thrombus just inferior to the indwelling inferior vena cava filter. The superior aspect of the inferior vena cava remains widely patent. The catheters and sheath were then removed. A per string stitch was placed at the popliteal access site. Manual compression was applied at both sites for approximately 5 minutes and hemostasis was achieved. The patient tolerated the procedure well and was returned to the floor in stable condition. FINDINGS: Severe mixed acute and chronic deep vein thrombosis extending from the right calf to the indwelling inferior vena cava. Technically successful mechanical thrombectomy, suction thrombectomy, and balloon angioplasty. Small volume residual IVC and right femoral chronic appearing thrombus, most prominent at the saphenofemoral junction. IMPRESSION: 1. Severe mixed acute and chronic deep vein thrombosis extending from the right calf to the indwelling inferior vena cava. 2. Technically successful mechanical thrombectomy, suction thrombectomy, and balloon angioplasty from the right femoral vein to the inferior vena cava. 3. Small volume residual inferior vena cava and right femoral chronic appearing thrombus. PLAN: 1. Bilateral thigh high compression stockings (20-30 mmHg) at all times. Continue compression stocking use after discharge. 2. Bilateral SCDs at all times unless ambulating. 3. Continue anticoagulation. 4. Follow up in IR clinic in 2 months with CTV abdomen/pelvis. Marliss Coots, MD [facility] Radiology Electronically Signed   By: Marliss Coots MD   On: 11/03/2020 08:38   IR IVUS EACH ADDITIONAL NON CORONARY VESSEL  Result Date: 11/03/2020 INDICATION: 65 year old male with history of recent hemorrhagic stroke status post IVC filter placement on 10/08/2020 with subsequent propagation of right greater than left lower extremity deep vein thrombosis with ileocaval involvement and near occlusion of the inferior  vena cava. Status post suction thrombectomy on 10/27/2020 with inadequate clearance of iliocaval thrombus. EXAM: 1. Ultrasound-guided venous access of the right popliteal vein. 2. Ultrasound-guided venous access of the right internal jugular vein. 3. Right lower extremity venogram. 4. Inferior vena cavagram. 5. Intravascular ultrasound. 6. Mechanical thrombectomy of the inferior vena cava, right internal and external iliac, common femoral, and femoral veins. 7. Suction thrombectomy of the inferior vena cava, right internal and external iliac, common femoral, and femoral veins. 8. Balloon angioplasty of the right femoral and common femoral veins. COMPARISON:  10/26/2020, 10/27/2020 MEDICATIONS: 13000 units heparin, intravenous ANESTHESIA/SEDATION: Versed 5 mg IV; Fentanyl 250 mcg IV Moderate Sedation Time:  2 hours, 45 minutes The patient was continuously monitored during the procedure by the interventional radiology nurse under my direct supervision. FLUOROSCOPY TIME:  Fluoroscopy Time: 25  minutes 18 seconds (81 mGy). CONTRAST:  75 mL Omnipaque 300, intravenous COMPLICATIONS: None immediate. TECHNIQUE: Informed written consent was obtained from the patient after a thorough discussion of the procedural risks, benefits and alternatives. All questions were addressed. Maximal Sterile Barrier Technique was utilized including caps, mask, sterile gowns, sterile gloves, sterile drape, hand hygiene and skin antiseptic. A timeout was performed prior to the initiation of the procedure. The bilateral lower extremities and right neck were prepped and draped in standard fashion. Under direct ultrasound visualization, the distal right popliteal vein was identified with the patient's leg in frog-leg position which demonstrated mixed acute and subacute thrombus. Subdermal local anesthesia was administered at the planned needle entry site. A skin nick was made. Under direct ultrasound visualization, the distal popliteal vein was  accessed with a 21 gauge micropuncture needle. A 0.018 inch Nitrex wire was then directed into the femoral vein with minimal resistance. A micropuncture sheath was placed. Serial dilation was performed and an 8 [place], 11 cm vascular sheath was placed. A Glidewire was directed into the suprarenal inferior vena cava and exchanged over a 5 French catheter for a superstiff exchange length Amplatz wire. Right lower extremity venogram was then performed with hand injection. Venogram was significant for nearly occlusive thrombus throughout the visualized femoral vein and common femoral vein. There was sluggish flow through the external and common iliac veins into the inferior vena cava where there is peripheral flow around large residual thrombus burden within and inferior to the indwelling inferior vena cava. Intravascular ultrasound was performed which demonstrated similar findings to venogram: Echogenic, expansile thrombus throughout the visualized femoral vein into the common femoral vein, patent external iliac vein, wall adherent echogenic thrombus within the common iliac vein, patent inferior aspect of the inferior vena cava and multifocal echogenic thrombus within the midportion of the infrarenal inferior vena cava extending into the indwelling inferior vena cava filter which is well positioned. The IVC is widely patent superior to the filter. Next, the internal jugular vein was assessed with ultrasound and was compressible and free of internal echoes. Subdermal Local anesthesia was administered 1% lidocaine at the planned needle entry site. A skin nick was made. Under direct ultrasound visualization, a 21 gauge micropuncture needle was advanced into the internal jugular vein and a micropuncture set was placed. Through this, a Wholey wire was directed to the inferior vena cava. Serial dilation was performed and a 16 French, 70 cm sheath was placed, directed under direct fluoroscopic guidance through the indwelling  inferior vena cava filter. Then, from the thigh lower extremity access, an Amplatz wire was directed into the distal aspect of the sheath lumen and through and through flossing access was established. The indwelling 8 French sheath was then exchanged for a 16 [place] ClotTriever sheath after serial dilation under fluoroscopic guidance. The ClotTriever catheter was then advanced into the 16 French IJ sheath, the coring element was exposed, and mechanical thrombectomy was performed from the inferior vena cava through the femoral vein. A total of 6 passes with this technique were performed yielding large volume of acute and chronic appearing thrombus. Repeat venogram was performed through the indwelling popliteal sheath which demonstrated significantly improved patency inflow of the femoral vein and inferior vena cava. There is significant flow-limiting focal stenosis in the mid femoral vein and at the saphenofemoral junction. Intravascular ultrasound was then again performed which demonstrated significantly improved patency of the femoral vein with the exception of echogenic wall adherent thrombus in the mid femoral vein in  addition to at the saphenofemoral junction. There is persistent echogenic thrombus just inferior to the indwelling inferior vena cava filter, decreased from comparison. Balloon angioplasty was then performed in the mid femoral vein and at the saphenofemoral junction with a 12 mm by 4 cm atlas balloon. Repeat venogram demonstrated minimally improved patency at the 2 stenotic areas. Therefore, 2 additional passes with the ClotTriever device were performed from the common femoral vein through the femoral vein yielding small volume chronic appearing thrombus. Repeat intravascular ultrasound and venogram demonstrated persistent flow limiting focal stenosis at the saphenofemoral junction and persistent chronic appearing thrombus within the inferior vena cava, just inferior to the indwelling filter.  Therefore, the 71 [place] FlowTriever device was used to perform suction thrombectomy within the inferior aspect of the filter as well as in the femoral vein at multiple sites. Multiple acute on chronic thrombi were aspirated from the inferior vena cava. There is minimal yield in the femoral vein. Repeat intravascular ultrasound venogram demonstrated persistent flow limiting stenosis at the saphenofemoral junction and improved thrombus burden in the inferior vena cava. Therefore, repeat balloon angioplasty of saphenofemoral junction and in the mid femoral vein was performed with a 14 mm x 4 cm atlas balloon with 1 minute inflation times. Completion venography and intravascular ultrasound was then performed. There is significantly improved patency of the mid femoral vein focal stenosis. There is persistent chronic appearing thrombus but improved patency at the saphenofemoral junction. The distal aspect of the inferior vena cava is widely patent. There is persistent small volume chronic appearing thrombus just inferior to the indwelling inferior vena cava filter. The superior aspect of the inferior vena cava remains widely patent. The catheters and sheath were then removed. A per string stitch was placed at the popliteal access site. Manual compression was applied at both sites for approximately 5 minutes and hemostasis was achieved. The patient tolerated the procedure well and was returned to the floor in stable condition. FINDINGS: Severe mixed acute and chronic deep vein thrombosis extending from the right calf to the indwelling inferior vena cava. Technically successful mechanical thrombectomy, suction thrombectomy, and balloon angioplasty. Small volume residual IVC and right femoral chronic appearing thrombus, most prominent at the saphenofemoral junction. IMPRESSION: 1. Severe mixed acute and chronic deep vein thrombosis extending from the right calf to the indwelling inferior vena cava. 2. Technically successful  mechanical thrombectomy, suction thrombectomy, and balloon angioplasty from the right femoral vein to the inferior vena cava. 3. Small volume residual inferior vena cava and right femoral chronic appearing thrombus. PLAN: 1. Bilateral thigh high compression stockings (20-30 mmHg) at all times. Continue compression stocking use after discharge. 2. Bilateral SCDs at all times unless ambulating. 3. Continue anticoagulation. 4. Follow up in IR clinic in 2 months with CTV abdomen/pelvis. Marliss Coots, MD [facility] Radiology Electronically Signed   By: Marliss Coots MD   On: 11/03/2020 08:38    Labs:  CBC: Recent Labs    10/23/20 0751 10/25/20 8119 10/27/20 0144 10/28/20 0226 10/29/20 0706  WBC 8.4 8.3 7.2  --  6.1  HGB 9.2* 9.0* 9.2* 8.3* 9.0*  HCT 29.6* 28.7* 29.2* 25.6* 28.3*  PLT 175 181 199  --  227    COAGS: Recent Labs    09/25/20 0309 10/25/20 1041  INR 1.0 1.1  APTT 27 34    BMP: Recent Labs    12/13/19 1155 03/02/20 1478 09/25/20 0309 10/03/20 2956 10/03/20 2130 10/27/20 0144 10/28/20 0226 10/28/20 1845 10/29/20  0706  NA 139 141   < > QUESTIONABLE RESULTS, RECOMMEND RECOLLECT TO VERIFY   < > 132* 133* 134* 133*  K 4.3 4.1   < > QUESTIONABLE RESULTS, RECOMMEND RECOLLECT TO VERIFY   < > 4.1 4.0 4.0 4.0  CL 102 103   < > QUESTIONABLE RESULTS, RECOMMEND RECOLLECT TO VERIFY   < > 99 101 101 100  CO2 27 23   < > QUESTIONABLE RESULTS, RECOMMEND RECOLLECT TO VERIFY   < > 25 24 25 25   GLUCOSE 103* 115*   < > QUESTIONABLE RESULTS, RECOMMEND RECOLLECT TO VERIFY   < > 98 137* 107* 122*  BUN 13 13   < > QUESTIONABLE RESULTS, RECOMMEND RECOLLECT TO VERIFY   < > 13 12 8  7*  CALCIUM 9.8 9.4   < > QUESTIONABLE RESULTS, RECOMMEND RECOLLECT TO VERIFY   < > 8.7* 8.4* 8.6* 8.6*  CREATININE 1.04 0.98   < > QUESTIONABLE RESULTS, RECOMMEND RECOLLECT TO VERIFY   < > 0.65 0.68 0.68 0.68  GFRNONAA >60 81   < > QUESTIONABLE RESULTS, RECOMMEND  RECOLLECT TO VERIFY   < > >60 >60 >60 >60  GFRAA >60 94  --  QUESTIONABLE RESULTS, RECOMMEND RECOLLECT TO VERIFY  --   --   --   --   --    < > = values in this interval not displayed.    LIVER FUNCTION TESTS: Recent Labs    10/18/20 0734 10/22/20 0326 10/27/20 1053 10/29/20 0706  BILITOT 1.0 0.8 0.8 0.9  AST 61* 37 39 27  ALT 136* 79* 57* 45*  ALKPHOS 129* 116 95 84  PROT 6.3* 5.6* 5.7* 5.9*  ALBUMIN 2.8* 2.5* 2.9* 2.9*    Assessment and Plan:  S/p Right lower extremity venogram, IVCgram and mechanical thrombectomy of inferior vena cava, right internal and external iliac, common femoral, and femoral veins, balloon angioplasty of the right femoral and common femoral veins on 5/5 with Dr. Elby Showers  Doing well Legs symmetrical RN will replace leg stockings again today Family at bedside--- will help try to keep pt stockings in place  Electronically Signed: Robet Leu, PA-C 11/05/2020, 10:19 AM   I spent a total of 15 Minutes at the the patient's bedside AND on the patient's hospital floor or unit, greater than 50% of which was counseling/coordinating care for RLE venogram- mechanical venous thrombectomy

## 2020-11-06 LAB — GLUCOSE, CAPILLARY
Glucose-Capillary: 110 mg/dL — ABNORMAL HIGH (ref 70–99)
Glucose-Capillary: 113 mg/dL — ABNORMAL HIGH (ref 70–99)
Glucose-Capillary: 121 mg/dL — ABNORMAL HIGH (ref 70–99)
Glucose-Capillary: 127 mg/dL — ABNORMAL HIGH (ref 70–99)
Glucose-Capillary: 132 mg/dL — ABNORMAL HIGH (ref 70–99)
Glucose-Capillary: 145 mg/dL — ABNORMAL HIGH (ref 70–99)

## 2020-11-06 LAB — BASIC METABOLIC PANEL
Anion gap: 7 (ref 5–15)
BUN: 7 mg/dL — ABNORMAL LOW (ref 8–23)
CO2: 26 mmol/L (ref 22–32)
Calcium: 9 mg/dL (ref 8.9–10.3)
Chloride: 101 mmol/L (ref 98–111)
Creatinine, Ser: 0.7 mg/dL (ref 0.61–1.24)
GFR, Estimated: >60 mL/min (ref >60)
Glucose, Bld: 123 mg/dL — ABNORMAL HIGH (ref 70–99)
Potassium: 4 mmol/L (ref 3.5–5.1)
Sodium: 134 mmol/L — ABNORMAL LOW (ref 135–145)

## 2020-11-06 NOTE — Progress Notes (Signed)
Per order, RT placed red cap on end of pt's trach. Pt tolerated well with SVS.

## 2020-11-06 NOTE — Progress Notes (Signed)
RT placed patient on beside continuous overnight pulse ox study. RT will download results in the morning.

## 2020-11-06 NOTE — Progress Notes (Addendum)
Referring Physician(s): Dr. Catha Gosselin, Judie Petit.   Supervising Physician: Mir, Secondary school teacher  Patient Status:  [facility] - In-pt  Chief Complaint:  S/p Right lower extremity venogram,IVCgram and mechanicalthrombectomy ofinferior vena cava, right internal and external iliac, common femoral, and femoral veins, balloon angioplasty of the right femoral and common femoral veinson 5/5with Dr. Elby Showers  Subjective:  Patient laying in bed, not in acute distress.  Remains on tracheostomy.  Has no leg complaints.  Patient not on thigh high compression stocking. Patient is on SCDs.   Allergies: Keppra [levetiracetam] and Latex  Medications: Prior to Admission medications   Medication Sig Start Date End Date Taking? Authorizing Provider  amantadine (SYMMETREL) 50 MG/5ML solution Place 20 mLs (200 mg total) into feeding tube 2 (two) times daily. 10/28/20   Mikhail, Nita Sells, DO  apixaban (ELIQUIS) 5 MG TABS tablet Place 1 tablet (5 mg total) into feeding tube 2 (two) times daily. 10/28/20   Edsel Petrin, DO  cholecalciferol (VITAMIN D3) 25 MCG (1000 UNIT) tablet Take 1,000 Units by mouth daily.    [provider]  diltiazem (CARDIZEM) 10 mg/ml oral suspension Place 3 mLs (30 mg total) into feeding tube every 6 (six) hours. 10/28/20   Mikhail, Nita Sells, DO  doxazosin (CARDURA) 2 MG tablet Place 1 tablet (2 mg total) into feeding tube daily. 10/28/20   Mikhail, Nita Sells, DO  EPINEPHrine 0.3 mg/0.3 mL IJ SOAJ injection Inject 0.3 mg into the muscle once as needed for anaphylaxis. 02/07/20   [provider]  feeding supplement (ENSURE ENLIVE / ENSURE PLUS) LIQD Take 237 mLs by mouth 2 (two) times daily between meals. 10/28/20   Mikhail, Nita Sells, DO  guaiFENesin-dextromethorphan (ROBITUSSIN DM) 100-10 MG/5ML syrup Place 5 mLs into feeding tube every 4 (four) hours as needed for cough. 10/28/20   Mikhail, Nita Sells, DO  insulin aspart (NOVOLOG) 100 UNIT/ML injection Inject 0-20 Units into the skin every 4  (four) hours. 10/28/20   Mikhail, Nita Sells, DO  insulin glargine (LANTUS) 100 UNIT/ML injection Inject 0.06 mLs (6 Units total) into the skin daily. 10/28/20   Mikhail, Nita Sells, DO  Nutritional Supplements (FEEDING SUPPLEMENT, PROSOURCE TF,) liquid Place 45 mLs into feeding tube 3 (three) times daily. 10/28/20   Mikhail, Nita Sells, DO  Nutritional Supplements (FEEDING SUPPLEMENT, VITAL 1.5 CAL,) LIQD Place 960 mLs into feeding tube daily. 10/28/20   Mikhail, Nita Sells, DO  oxyCODONE (OXY IR/ROXICODONE) 5 MG immediate release tablet Place 1 tablet (5 mg total) into feeding tube every 6 (six) hours as needed (RASS goal 0). 10/28/20   Edsel Petrin, DO  QUEtiapine (SEROQUEL) 25 MG tablet Take 0.5 tablets (12.5 mg total) by mouth at bedtime. 10/28/20   Edsel Petrin, DO  Water For Irrigation, Sterile (FREE WATER) SOLN Place 200 mLs into feeding tube every 4 (four) hours. 10/28/20   Edsel Petrin, DO     Vital Signs: BP 118/65 (BP Location: Left Arm)   Pulse 85   Temp 98.2 F (36.8 C)   Resp 18   Wt 208 lb 1.8 oz (94.4 kg)   SpO2 97%   BMI 26.72 kg/m   Physical Exam Vitals reviewed.  Constitutional:      General: He is not in acute distress.    Appearance: He is ill-appearing.  HENT:     Head: Normocephalic and atraumatic.     Nose:     Comments: Positive for NG tube  Neck:     Comments: Positive for tracheostomy Cardiovascular:     Rate and Rhythm: Normal rate.  Pulmonary:     Effort: No respiratory distress.  Musculoskeletal:        General: No swelling or tenderness.     Right lower leg: No edema.     Left lower leg: No edema.     Comments: Bilateral lower extremity symmetric, no edema or  erythremia.  No tenderness to palpation bilaterally.   Skin:    General: Skin is warm and dry.     Findings: No erythema.  Neurological:     Mental Status: He is alert. Mental status is at baseline.     Imaging: IR Veno/Ext/Uni Right  Result Date: 11/03/2020 INDICATION: 65 year old  male with history of recent hemorrhagic stroke status post IVC filter placement on 10/08/2020 with subsequent propagation of right greater than left lower extremity deep vein thrombosis with ileocaval involvement and near occlusion of the inferior vena cava. Status post suction thrombectomy on 10/27/2020 with inadequate clearance of iliocaval thrombus. EXAM: 1. Ultrasound-guided venous access of the right popliteal vein. 2. Ultrasound-guided venous access of the right internal jugular vein. 3. Right lower extremity venogram. 4. Inferior vena cavagram. 5. Intravascular ultrasound. 6. Mechanical thrombectomy of the inferior vena cava, right internal and external iliac, common femoral, and femoral veins. 7. Suction thrombectomy of the inferior vena cava, right internal and external iliac, common femoral, and femoral veins. 8. Balloon angioplasty of the right femoral and common femoral veins. COMPARISON:  10/26/2020, 10/27/2020 MEDICATIONS: 13000 units heparin, intravenous ANESTHESIA/SEDATION: Versed 5 mg IV; Fentanyl 250 mcg IV Moderate Sedation Time:  2 hours, 45 minutes The patient was continuously monitored during the procedure by the interventional radiology nurse under my direct supervision. FLUOROSCOPY TIME:  Fluoroscopy Time: 25 minutes 18 seconds (81 mGy). CONTRAST:  75 mL Omnipaque 300, intravenous COMPLICATIONS: None immediate. TECHNIQUE: Informed written consent was obtained from the patient after a thorough discussion of the procedural risks, benefits and alternatives. All questions were addressed. Maximal Sterile Barrier Technique was utilized including caps, mask, sterile gowns, sterile gloves, sterile drape, hand hygiene and skin antiseptic. A timeout was performed prior to the initiation of the procedure. The bilateral lower extremities and right neck were prepped and draped in standard fashion. Under direct ultrasound visualization, the distal right popliteal vein was identified with the patient's leg  in frog-leg position which demonstrated mixed acute and subacute thrombus. Subdermal local anesthesia was administered at the planned needle entry site. A skin nick was made. Under direct ultrasound visualization, the distal popliteal vein was accessed with a 21 gauge micropuncture needle. A 0.018 inch Nitrex wire was then directed into the femoral vein with minimal resistance. A micropuncture sheath was placed. Serial dilation was performed and an 8 [place], 11 cm vascular sheath was placed. A Glidewire was directed into the suprarenal inferior vena cava and exchanged over a 5 French catheter for a superstiff exchange length Amplatz wire. Right lower extremity venogram was then performed with hand injection. Venogram was significant for nearly occlusive thrombus throughout the visualized femoral vein and common femoral vein. There was sluggish flow through the external and common iliac veins into the inferior vena cava where there is peripheral flow around large residual thrombus burden within and inferior to the indwelling inferior vena cava. Intravascular ultrasound was performed which demonstrated similar findings to venogram: Echogenic, expansile thrombus throughout the visualized femoral vein into the common femoral vein, patent external iliac vein, wall adherent echogenic thrombus within the common iliac vein, patent inferior aspect of the inferior vena cava and multifocal echogenic thrombus within the  midportion of the infrarenal inferior vena cava extending into the indwelling inferior vena cava filter which is well positioned. The IVC is widely patent superior to the filter. Next, the internal jugular vein was assessed with ultrasound and was compressible and free of internal echoes. Subdermal Local anesthesia was administered 1% lidocaine at the planned needle entry site. A skin nick was made. Under direct ultrasound visualization, a 21 gauge micropuncture needle was advanced into the internal jugular vein  and a micropuncture set was placed. Through this, a Wholey wire was directed to the inferior vena cava. Serial dilation was performed and a 16 French, 70 cm sheath was placed, directed under direct fluoroscopic guidance through the indwelling inferior vena cava filter. Then, from the thigh lower extremity access, an Amplatz wire was directed into the distal aspect of the sheath lumen and through and through flossing access was established. The indwelling 8 French sheath was then exchanged for a 16 [place] ClotTriever sheath after serial dilation under fluoroscopic guidance. The ClotTriever catheter was then advanced into the 16 French IJ sheath, the coring element was exposed, and mechanical thrombectomy was performed from the inferior vena cava through the femoral vein. A total of 6 passes with this technique were performed yielding large volume of acute and chronic appearing thrombus. Repeat venogram was performed through the indwelling popliteal sheath which demonstrated significantly improved patency inflow of the femoral vein and inferior vena cava. There is significant flow-limiting focal stenosis in the mid femoral vein and at the saphenofemoral junction. Intravascular ultrasound was then again performed which demonstrated significantly improved patency of the femoral vein with the exception of echogenic wall adherent thrombus in the mid femoral vein in addition to at the saphenofemoral junction. There is persistent echogenic thrombus just inferior to the indwelling inferior vena cava filter, decreased from comparison. Balloon angioplasty was then performed in the mid femoral vein and at the saphenofemoral junction with a 12 mm by 4 cm atlas balloon. Repeat venogram demonstrated minimally improved patency at the 2 stenotic areas. Therefore, 2 additional passes with the ClotTriever device were performed from the common femoral vein through the femoral vein yielding small volume chronic appearing thrombus.  Repeat intravascular ultrasound and venogram demonstrated persistent flow limiting focal stenosis at the saphenofemoral junction and persistent chronic appearing thrombus within the inferior vena cava, just inferior to the indwelling filter. Therefore, the 28 [place] FlowTriever device was used to perform suction thrombectomy within the inferior aspect of the filter as well as in the femoral vein at multiple sites. Multiple acute on chronic thrombi were aspirated from the inferior vena cava. There is minimal yield in the femoral vein. Repeat intravascular ultrasound venogram demonstrated persistent flow limiting stenosis at the saphenofemoral junction and improved thrombus burden in the inferior vena cava. Therefore, repeat balloon angioplasty of saphenofemoral junction and in the mid femoral vein was performed with a 14 mm x 4 cm atlas balloon with 1 minute inflation times. Completion venography and intravascular ultrasound was then performed. There is significantly improved patency of the mid femoral vein focal stenosis. There is persistent chronic appearing thrombus but improved patency at the saphenofemoral junction. The distal aspect of the inferior vena cava is widely patent. There is persistent small volume chronic appearing thrombus just inferior to the indwelling inferior vena cava filter. The superior aspect of the inferior vena cava remains widely patent. The catheters and sheath were then removed. A per string stitch was placed at the popliteal access site. Manual compression was applied at both  sites for approximately 5 minutes and hemostasis was achieved. The patient tolerated the procedure well and was returned to the floor in stable condition. FINDINGS: Severe mixed acute and chronic deep vein thrombosis extending from the right calf to the indwelling inferior vena cava. Technically successful mechanical thrombectomy, suction thrombectomy, and balloon angioplasty. Small volume residual IVC and right  femoral chronic appearing thrombus, most prominent at the saphenofemoral junction. IMPRESSION: 1. Severe mixed acute and chronic deep vein thrombosis extending from the right calf to the indwelling inferior vena cava. 2. Technically successful mechanical thrombectomy, suction thrombectomy, and balloon angioplasty from the right femoral vein to the inferior vena cava. 3. Small volume residual inferior vena cava and right femoral chronic appearing thrombus. PLAN: 1. Bilateral thigh high compression stockings (20-30 mmHg) at all times. Continue compression stocking use after discharge. 2. Bilateral SCDs at all times unless ambulating. 3. Continue anticoagulation. 4. Follow up in IR clinic in 2 months with CTV abdomen/pelvis. Marliss Coots, MD [facility] Radiology Electronically Signed   By: Marliss Coots MD   On: 11/03/2020 08:38   IR [facility] MOD SED  Result Date: 11/03/2020 INDICATION: 65 year old male with history of recent hemorrhagic stroke status post IVC filter placement on 10/08/2020 with subsequent propagation of right greater than left lower extremity deep vein thrombosis with ileocaval involvement and near occlusion of the inferior vena cava. Status post suction thrombectomy on 10/27/2020 with inadequate clearance of iliocaval thrombus. EXAM: 1. Ultrasound-guided venous access of the right popliteal vein. 2. Ultrasound-guided venous access of the right internal jugular vein. 3. Right lower extremity venogram. 4. Inferior vena cavagram. 5. Intravascular ultrasound. 6. Mechanical thrombectomy of the inferior vena cava, right internal and external iliac, common femoral, and femoral veins. 7. Suction thrombectomy of the inferior vena cava, right internal and external iliac, common femoral, and femoral veins. 8. Balloon angioplasty of the right femoral and common femoral veins. COMPARISON:  10/26/2020, 10/27/2020 MEDICATIONS: 13000 units heparin,  intravenous ANESTHESIA/SEDATION: Versed 5 mg IV; Fentanyl 250 mcg IV Moderate Sedation Time:  2 hours, 45 minutes The patient was continuously monitored during the procedure by the interventional radiology nurse under my direct supervision. FLUOROSCOPY TIME:  Fluoroscopy Time: 25 minutes 18 seconds (81 mGy). CONTRAST:  75 mL Omnipaque 300, intravenous COMPLICATIONS: None immediate. TECHNIQUE: Informed written consent was obtained from the patient after a thorough discussion of the procedural risks, benefits and alternatives. All questions were addressed. Maximal Sterile Barrier Technique was utilized including caps, mask, sterile gowns, sterile gloves, sterile drape, hand hygiene and skin antiseptic. A timeout was performed prior to the initiation of the procedure. The bilateral lower extremities and right neck were prepped and draped in standard fashion. Under direct ultrasound visualization, the distal right popliteal vein was identified with the patient's leg in frog-leg position which demonstrated mixed acute and subacute thrombus. Subdermal local anesthesia was administered at the planned needle entry site. A skin nick was made. Under direct ultrasound visualization, the distal popliteal vein was accessed with a 21 gauge micropuncture needle. A 0.018 inch Nitrex wire was then directed into the femoral vein with minimal resistance. A micropuncture sheath was placed. Serial dilation was performed and an 8 [place], 11 cm vascular sheath was placed. A Glidewire was directed into the suprarenal inferior vena cava and exchanged over a 5 French catheter for a superstiff exchange length Amplatz wire. Right lower extremity venogram was then performed with hand injection. Venogram was significant for nearly occlusive thrombus throughout the  visualized femoral vein and common femoral vein. There was sluggish flow through the external and common iliac veins into the inferior vena cava where there is peripheral flow around  large residual thrombus burden within and inferior to the indwelling inferior vena cava. Intravascular ultrasound was performed which demonstrated similar findings to venogram: Echogenic, expansile thrombus throughout the visualized femoral vein into the common femoral vein, patent external iliac vein, wall adherent echogenic thrombus within the common iliac vein, patent inferior aspect of the inferior vena cava and multifocal echogenic thrombus within the midportion of the infrarenal inferior vena cava extending into the indwelling inferior vena cava filter which is well positioned. The IVC is widely patent superior to the filter. Next, the internal jugular vein was assessed with ultrasound and was compressible and free of internal echoes. Subdermal Local anesthesia was administered 1% lidocaine at the planned needle entry site. A skin nick was made. Under direct ultrasound visualization, a 21 gauge micropuncture needle was advanced into the internal jugular vein and a micropuncture set was placed. Through this, a Wholey wire was directed to the inferior vena cava. Serial dilation was performed and a 16 French, 70 cm sheath was placed, directed under direct fluoroscopic guidance through the indwelling inferior vena cava filter. Then, from the thigh lower extremity access, an Amplatz wire was directed into the distal aspect of the sheath lumen and through and through flossing access was established. The indwelling 8 French sheath was then exchanged for a 16 [place] ClotTriever sheath after serial dilation under fluoroscopic guidance. The ClotTriever catheter was then advanced into the 16 French IJ sheath, the coring element was exposed, and mechanical thrombectomy was performed from the inferior vena cava through the femoral vein. A total of 6 passes with this technique were performed yielding large volume of acute and chronic appearing thrombus. Repeat venogram was performed through the indwelling popliteal sheath  which demonstrated significantly improved patency inflow of the femoral vein and inferior vena cava. There is significant flow-limiting focal stenosis in the mid femoral vein and at the saphenofemoral junction. Intravascular ultrasound was then again performed which demonstrated significantly improved patency of the femoral vein with the exception of echogenic wall adherent thrombus in the mid femoral vein in addition to at the saphenofemoral junction. There is persistent echogenic thrombus just inferior to the indwelling inferior vena cava filter, decreased from comparison. Balloon angioplasty was then performed in the mid femoral vein and at the saphenofemoral junction with a 12 mm by 4 cm atlas balloon. Repeat venogram demonstrated minimally improved patency at the 2 stenotic areas. Therefore, 2 additional passes with the ClotTriever device were performed from the common femoral vein through the femoral vein yielding small volume chronic appearing thrombus. Repeat intravascular ultrasound and venogram demonstrated persistent flow limiting focal stenosis at the saphenofemoral junction and persistent chronic appearing thrombus within the inferior vena cava, just inferior to the indwelling filter. Therefore, the 14 [place] FlowTriever device was used to perform suction thrombectomy within the inferior aspect of the filter as well as in the femoral vein at multiple sites. Multiple acute on chronic thrombi were aspirated from the inferior vena cava. There is minimal yield in the femoral vein. Repeat intravascular ultrasound venogram demonstrated persistent flow limiting stenosis at the saphenofemoral junction and improved thrombus burden in the inferior vena cava. Therefore, repeat balloon angioplasty of saphenofemoral junction and in the mid femoral vein was performed with a 14 mm x 4 cm atlas balloon with 1 minute inflation times. Completion venography and intravascular  ultrasound was then performed. There is  significantly improved patency of the mid femoral vein focal stenosis. There is persistent chronic appearing thrombus but improved patency at the saphenofemoral junction. The distal aspect of the inferior vena cava is widely patent. There is persistent small volume chronic appearing thrombus just inferior to the indwelling inferior vena cava filter. The superior aspect of the inferior vena cava remains widely patent. The catheters and sheath were then removed. A per string stitch was placed at the popliteal access site. Manual compression was applied at both sites for approximately 5 minutes and hemostasis was achieved. The patient tolerated the procedure well and was returned to the floor in stable condition. FINDINGS: Severe mixed acute and chronic deep vein thrombosis extending from the right calf to the indwelling inferior vena cava. Technically successful mechanical thrombectomy, suction thrombectomy, and balloon angioplasty. Small volume residual IVC and right femoral chronic appearing thrombus, most prominent at the saphenofemoral junction. IMPRESSION: 1. Severe mixed acute and chronic deep vein thrombosis extending from the right calf to the indwelling inferior vena cava. 2. Technically successful mechanical thrombectomy, suction thrombectomy, and balloon angioplasty from the right femoral vein to the inferior vena cava. 3. Small volume residual inferior vena cava and right femoral chronic appearing thrombus. PLAN: 1. Bilateral thigh high compression stockings (20-30 mmHg) at all times. Continue compression stocking use after discharge. 2. Bilateral SCDs at all times unless ambulating. 3. Continue anticoagulation. 4. Follow up in IR clinic in 2 months with CTV abdomen/pelvis. Marliss Coots, MD [facility] Radiology Electronically Signed   By: Marliss Coots MD   On: 11/03/2020 08:38   IR US Guide Vasc Access Right  Result Date: 11/03/2020 INDICATION:  65 year old male with history of recent hemorrhagic stroke status post IVC filter placement on 10/08/2020 with subsequent propagation of right greater than left lower extremity deep vein thrombosis with ileocaval involvement and near occlusion of the inferior vena cava. Status post suction thrombectomy on 10/27/2020 with inadequate clearance of iliocaval thrombus. EXAM: 1. Ultrasound-guided venous access of the right popliteal vein. 2. Ultrasound-guided venous access of the right internal jugular vein. 3. Right lower extremity venogram. 4. Inferior vena cavagram. 5. Intravascular ultrasound. 6. Mechanical thrombectomy of the inferior vena cava, right internal and external iliac, common femoral, and femoral veins. 7. Suction thrombectomy of the inferior vena cava, right internal and external iliac, common femoral, and femoral veins. 8. Balloon angioplasty of the right femoral and common femoral veins. COMPARISON:  10/26/2020, 10/27/2020 MEDICATIONS: 13000 units heparin, intravenous ANESTHESIA/SEDATION: Versed 5 mg IV; Fentanyl 250 mcg IV Moderate Sedation Time:  2 hours, 45 minutes The patient was continuously monitored during the procedure by the interventional radiology nurse under my direct supervision. FLUOROSCOPY TIME:  Fluoroscopy Time: 25 minutes 18 seconds (81 mGy). CONTRAST:  75 mL Omnipaque 300, intravenous COMPLICATIONS: None immediate. TECHNIQUE: Informed written consent was obtained from the patient after a thorough discussion of the procedural risks, benefits and alternatives. All questions were addressed. Maximal Sterile Barrier Technique was utilized including caps, mask, sterile gowns, sterile gloves, sterile drape, hand hygiene and skin antiseptic. A timeout was performed prior to the initiation of the procedure. The bilateral lower extremities and right neck were prepped and draped in standard fashion. Under direct ultrasound visualization, the distal right popliteal vein was identified with the  patient's leg in frog-leg position which demonstrated mixed acute and subacute thrombus. Subdermal local anesthesia was administered at the planned needle entry site. A skin nick was made.  Under direct ultrasound visualization, the distal popliteal vein was accessed with a 21 gauge micropuncture needle. A 0.018 inch Nitrex wire was then directed into the femoral vein with minimal resistance. A micropuncture sheath was placed. Serial dilation was performed and an 8 [place], 11 cm vascular sheath was placed. A Glidewire was directed into the suprarenal inferior vena cava and exchanged over a 5 French catheter for a superstiff exchange length Amplatz wire. Right lower extremity venogram was then performed with hand injection. Venogram was significant for nearly occlusive thrombus throughout the visualized femoral vein and common femoral vein. There was sluggish flow through the external and common iliac veins into the inferior vena cava where there is peripheral flow around large residual thrombus burden within and inferior to the indwelling inferior vena cava. Intravascular ultrasound was performed which demonstrated similar findings to venogram: Echogenic, expansile thrombus throughout the visualized femoral vein into the common femoral vein, patent external iliac vein, wall adherent echogenic thrombus within the common iliac vein, patent inferior aspect of the inferior vena cava and multifocal echogenic thrombus within the midportion of the infrarenal inferior vena cava extending into the indwelling inferior vena cava filter which is well positioned. The IVC is widely patent superior to the filter. Next, the internal jugular vein was assessed with ultrasound and was compressible and free of internal echoes. Subdermal Local anesthesia was administered 1% lidocaine at the planned needle entry site. A skin nick was made. Under direct ultrasound visualization, a 21 gauge micropuncture needle was advanced into the  internal jugular vein and a micropuncture set was placed. Through this, a Wholey wire was directed to the inferior vena cava. Serial dilation was performed and a 16 French, 70 cm sheath was placed, directed under direct fluoroscopic guidance through the indwelling inferior vena cava filter. Then, from the thigh lower extremity access, an Amplatz wire was directed into the distal aspect of the sheath lumen and through and through flossing access was established. The indwelling 8 French sheath was then exchanged for a 16 [place] ClotTriever sheath after serial dilation under fluoroscopic guidance. The ClotTriever catheter was then advanced into the 16 French IJ sheath, the coring element was exposed, and mechanical thrombectomy was performed from the inferior vena cava through the femoral vein. A total of 6 passes with this technique were performed yielding large volume of acute and chronic appearing thrombus. Repeat venogram was performed through the indwelling popliteal sheath which demonstrated significantly improved patency inflow of the femoral vein and inferior vena cava. There is significant flow-limiting focal stenosis in the mid femoral vein and at the saphenofemoral junction. Intravascular ultrasound was then again performed which demonstrated significantly improved patency of the femoral vein with the exception of echogenic wall adherent thrombus in the mid femoral vein in addition to at the saphenofemoral junction. There is persistent echogenic thrombus just inferior to the indwelling inferior vena cava filter, decreased from comparison. Balloon angioplasty was then performed in the mid femoral vein and at the saphenofemoral junction with a 12 mm by 4 cm atlas balloon. Repeat venogram demonstrated minimally improved patency at the 2 stenotic areas. Therefore, 2 additional passes with the ClotTriever device were performed from the common femoral vein through the femoral vein yielding small volume chronic  appearing thrombus. Repeat intravascular ultrasound and venogram demonstrated persistent flow limiting focal stenosis at the saphenofemoral junction and persistent chronic appearing thrombus within the inferior vena cava, just inferior to the indwelling filter. Therefore, the 27 [place] FlowTriever device was used to perform suction  thrombectomy within the inferior aspect of the filter as well as in the femoral vein at multiple sites. Multiple acute on chronic thrombi were aspirated from the inferior vena cava. There is minimal yield in the femoral vein. Repeat intravascular ultrasound venogram demonstrated persistent flow limiting stenosis at the saphenofemoral junction and improved thrombus burden in the inferior vena cava. Therefore, repeat balloon angioplasty of saphenofemoral junction and in the mid femoral vein was performed with a 14 mm x 4 cm atlas balloon with 1 minute inflation times. Completion venography and intravascular ultrasound was then performed. There is significantly improved patency of the mid femoral vein focal stenosis. There is persistent chronic appearing thrombus but improved patency at the saphenofemoral junction. The distal aspect of the inferior vena cava is widely patent. There is persistent small volume chronic appearing thrombus just inferior to the indwelling inferior vena cava filter. The superior aspect of the inferior vena cava remains widely patent. The catheters and sheath were then removed. A per string stitch was placed at the popliteal access site. Manual compression was applied at both sites for approximately 5 minutes and hemostasis was achieved. The patient tolerated the procedure well and was returned to the floor in stable condition. FINDINGS: Severe mixed acute and chronic deep vein thrombosis extending from the right calf to the indwelling inferior vena cava. Technically successful mechanical thrombectomy, suction thrombectomy, and balloon angioplasty. Small volume  residual IVC and right femoral chronic appearing thrombus, most prominent at the saphenofemoral junction. IMPRESSION: 1. Severe mixed acute and chronic deep vein thrombosis extending from the right calf to the indwelling inferior vena cava. 2. Technically successful mechanical thrombectomy, suction thrombectomy, and balloon angioplasty from the right femoral vein to the inferior vena cava. 3. Small volume residual inferior vena cava and right femoral chronic appearing thrombus. PLAN: 1. Bilateral thigh high compression stockings (20-30 mmHg) at all times. Continue compression stocking use after discharge. 2. Bilateral SCDs at all times unless ambulating. 3. Continue anticoagulation. 4. Follow up in IR clinic in 2 months with CTV abdomen/pelvis. Marliss Cootsylan Suttle, MD [facility] Electronically Signed   By: Marliss Cootsylan  Suttle MD   On: 11/03/2020 08:38   IR PTA VENOUS EXCEPT DIALYSIS CIRCUIT  Result Date: 11/03/2020 INDICATION: 65 year old male with history of recent hemorrhagic stroke status post IVC filter placement on 10/08/2020 with subsequent propagation of right greater than left lower extremity deep vein thrombosis with ileocaval involvement and near occlusion of the inferior vena cava. Status post suction thrombectomy on 10/27/2020 with inadequate clearance of iliocaval thrombus. EXAM: 1. Ultrasound-guided venous access of the right popliteal vein. 2. Ultrasound-guided venous access of the right internal jugular vein. 3. Right lower extremity venogram. 4. Inferior vena cavagram. 5. Intravascular ultrasound. 6. Mechanical thrombectomy of the inferior vena cava, right internal and external iliac, common femoral, and femoral veins. 7. Suction thrombectomy of the inferior vena cava, right internal and external iliac, common femoral, and femoral veins. 8. Balloon angioplasty of the right femoral and common femoral veins. COMPARISON:  10/26/2020, 10/27/2020  MEDICATIONS: 13000 units heparin, intravenous ANESTHESIA/SEDATION: Versed 5 mg IV; Fentanyl 250 mcg IV Moderate Sedation Time:  2 hours, 45 minutes The patient was continuously monitored during the procedure by the interventional radiology nurse under my direct supervision. FLUOROSCOPY TIME:  Fluoroscopy Time: 25 minutes 18 seconds (81 mGy). CONTRAST:  75 mL Omnipaque 300, intravenous COMPLICATIONS: None immediate. TECHNIQUE: Informed written consent was obtained from the patient after a thorough discussion of the procedural risks, benefits  and alternatives. All questions were addressed. Maximal Sterile Barrier Technique was utilized including caps, mask, sterile gowns, sterile gloves, sterile drape, hand hygiene and skin antiseptic. A timeout was performed prior to the initiation of the procedure. The bilateral lower extremities and right neck were prepped and draped in standard fashion. Under direct ultrasound visualization, the distal right popliteal vein was identified with the patient's leg in frog-leg position which demonstrated mixed acute and subacute thrombus. Subdermal local anesthesia was administered at the planned needle entry site. A skin nick was made. Under direct ultrasound visualization, the distal popliteal vein was accessed with a 21 gauge micropuncture needle. A 0.018 inch Nitrex wire was then directed into the femoral vein with minimal resistance. A micropuncture sheath was placed. Serial dilation was performed and an 8 [place], 11 cm vascular sheath was placed. A Glidewire was directed into the suprarenal inferior vena cava and exchanged over a 5 French catheter for a superstiff exchange length Amplatz wire. Right lower extremity venogram was then performed with hand injection. Venogram was significant for nearly occlusive thrombus throughout the visualized femoral vein and common femoral vein. There was sluggish flow through the external and common iliac veins into the inferior vena cava where  there is peripheral flow around large residual thrombus burden within and inferior to the indwelling inferior vena cava. Intravascular ultrasound was performed which demonstrated similar findings to venogram: Echogenic, expansile thrombus throughout the visualized femoral vein into the common femoral vein, patent external iliac vein, wall adherent echogenic thrombus within the common iliac vein, patent inferior aspect of the inferior vena cava and multifocal echogenic thrombus within the midportion of the infrarenal inferior vena cava extending into the indwelling inferior vena cava filter which is well positioned. The IVC is widely patent superior to the filter. Next, the internal jugular vein was assessed with ultrasound and was compressible and free of internal echoes. Subdermal Local anesthesia was administered 1% lidocaine at the planned needle entry site. A skin nick was made. Under direct ultrasound visualization, a 21 gauge micropuncture needle was advanced into the internal jugular vein and a micropuncture set was placed. Through this, a Wholey wire was directed to the inferior vena cava. Serial dilation was performed and a 16 French, 70 cm sheath was placed, directed under direct fluoroscopic guidance through the indwelling inferior vena cava filter. Then, from the thigh lower extremity access, an Amplatz wire was directed into the distal aspect of the sheath lumen and through and through flossing access was established. The indwelling 8 French sheath was then exchanged for a 16 [place] ClotTriever sheath after serial dilation under fluoroscopic guidance. The ClotTriever catheter was then advanced into the 16 French IJ sheath, the coring element was exposed, and mechanical thrombectomy was performed from the inferior vena cava through the femoral vein. A total of 6 passes with this technique were performed yielding large volume of acute and chronic appearing thrombus. Repeat venogram was performed through  the indwelling popliteal sheath which demonstrated significantly improved patency inflow of the femoral vein and inferior vena cava. There is significant flow-limiting focal stenosis in the mid femoral vein and at the saphenofemoral junction. Intravascular ultrasound was then again performed which demonstrated significantly improved patency of the femoral vein with the exception of echogenic wall adherent thrombus in the mid femoral vein in addition to at the saphenofemoral junction. There is persistent echogenic thrombus just inferior to the indwelling inferior vena cava filter, decreased from comparison. Balloon angioplasty was then performed in the mid femoral vein  and at the saphenofemoral junction with a 12 mm by 4 cm atlas balloon. Repeat venogram demonstrated minimally improved patency at the 2 stenotic areas. Therefore, 2 additional passes with the ClotTriever device were performed from the common femoral vein through the femoral vein yielding small volume chronic appearing thrombus. Repeat intravascular ultrasound and venogram demonstrated persistent flow limiting focal stenosis at the saphenofemoral junction and persistent chronic appearing thrombus within the inferior vena cava, just inferior to the indwelling filter. Therefore, the 46 [place] FlowTriever device was used to perform suction thrombectomy within the inferior aspect of the filter as well as in the femoral vein at multiple sites. Multiple acute on chronic thrombi were aspirated from the inferior vena cava. There is minimal yield in the femoral vein. Repeat intravascular ultrasound venogram demonstrated persistent flow limiting stenosis at the saphenofemoral junction and improved thrombus burden in the inferior vena cava. Therefore, repeat balloon angioplasty of saphenofemoral junction and in the mid femoral vein was performed with a 14 mm x 4 cm atlas balloon with 1 minute inflation times. Completion venography and intravascular ultrasound was  then performed. There is significantly improved patency of the mid femoral vein focal stenosis. There is persistent chronic appearing thrombus but improved patency at the saphenofemoral junction. The distal aspect of the inferior vena cava is widely patent. There is persistent small volume chronic appearing thrombus just inferior to the indwelling inferior vena cava filter. The superior aspect of the inferior vena cava remains widely patent. The catheters and sheath were then removed. A per string stitch was placed at the popliteal access site. Manual compression was applied at both sites for approximately 5 minutes and hemostasis was achieved. The patient tolerated the procedure well and was returned to the floor in stable condition. FINDINGS: Severe mixed acute and chronic deep vein thrombosis extending from the right calf to the indwelling inferior vena cava. Technically successful mechanical thrombectomy, suction thrombectomy, and balloon angioplasty. Small volume residual IVC and right femoral chronic appearing thrombus, most prominent at the saphenofemoral junction. IMPRESSION: 1. Severe mixed acute and chronic deep vein thrombosis extending from the right calf to the indwelling inferior vena cava. 2. Technically successful mechanical thrombectomy, suction thrombectomy, and balloon angioplasty from the right femoral vein to the inferior vena cava. 3. Small volume residual inferior vena cava and right femoral chronic appearing thrombus. PLAN: 1. Bilateral thigh high compression stockings (20-30 mmHg) at all times. Continue compression stocking use after discharge. 2. Bilateral SCDs at all times unless ambulating. 3. Continue anticoagulation. 4. Follow up in IR clinic in 2 months with CTV abdomen/pelvis. Marliss Coots, MD [facility] Electronically Signed   By: Marliss Coots MD   On: 11/03/2020 08:38   IR IVUS EACH ADDITIONAL NON CORONARY  VESSEL  Result Date: 11/03/2020 INDICATION: 65 year old male with history of recent hemorrhagic stroke status post IVC filter placement on 10/08/2020 with subsequent propagation of right greater than left lower extremity deep vein thrombosis with ileocaval involvement and near occlusion of the inferior vena cava. Status post suction thrombectomy on 10/27/2020 with inadequate clearance of iliocaval thrombus. EXAM: 1. Ultrasound-guided venous access of the right popliteal vein. 2. Ultrasound-guided venous access of the right internal jugular vein. 3. Right lower extremity venogram. 4. Inferior vena cavagram. 5. Intravascular ultrasound. 6. Mechanical thrombectomy of the inferior vena cava, right internal and external iliac, common femoral, and femoral veins. 7. Suction thrombectomy of the inferior vena cava, right internal and external iliac, common femoral, and femoral veins.  8. Balloon angioplasty of the right femoral and common femoral veins. COMPARISON:  10/26/2020, 10/27/2020 MEDICATIONS: 13000 units heparin, intravenous ANESTHESIA/SEDATION: Versed 5 mg IV; Fentanyl 250 mcg IV Moderate Sedation Time:  2 hours, 45 minutes The patient was continuously monitored during the procedure by the interventional radiology nurse under my direct supervision. FLUOROSCOPY TIME:  Fluoroscopy Time: 25 minutes 18 seconds (81 mGy). CONTRAST:  75 mL Omnipaque 300, intravenous COMPLICATIONS: None immediate. TECHNIQUE: Informed written consent was obtained from the patient after a thorough discussion of the procedural risks, benefits and alternatives. All questions were addressed. Maximal Sterile Barrier Technique was utilized including caps, mask, sterile gowns, sterile gloves, sterile drape, hand hygiene and skin antiseptic. A timeout was performed prior to the initiation of the procedure. The bilateral lower extremities and right neck were prepped and draped in standard fashion. Under direct ultrasound visualization, the distal  right popliteal vein was identified with the patient's leg in frog-leg position which demonstrated mixed acute and subacute thrombus. Subdermal local anesthesia was administered at the planned needle entry site. A skin nick was made. Under direct ultrasound visualization, the distal popliteal vein was accessed with a 21 gauge micropuncture needle. A 0.018 inch Nitrex wire was then directed into the femoral vein with minimal resistance. A micropuncture sheath was placed. Serial dilation was performed and an 8 [place], 11 cm vascular sheath was placed. A Glidewire was directed into the suprarenal inferior vena cava and exchanged over a 5 French catheter for a superstiff exchange length Amplatz wire. Right lower extremity venogram was then performed with hand injection. Venogram was significant for nearly occlusive thrombus throughout the visualized femoral vein and common femoral vein. There was sluggish flow through the external and common iliac veins into the inferior vena cava where there is peripheral flow around large residual thrombus burden within and inferior to the indwelling inferior vena cava. Intravascular ultrasound was performed which demonstrated similar findings to venogram: Echogenic, expansile thrombus throughout the visualized femoral vein into the common femoral vein, patent external iliac vein, wall adherent echogenic thrombus within the common iliac vein, patent inferior aspect of the inferior vena cava and multifocal echogenic thrombus within the midportion of the infrarenal inferior vena cava extending into the indwelling inferior vena cava filter which is well positioned. The IVC is widely patent superior to the filter. Next, the internal jugular vein was assessed with ultrasound and was compressible and free of internal echoes. Subdermal Local anesthesia was administered 1% lidocaine at the planned needle entry site. A skin nick was made. Under direct ultrasound visualization, a 21 gauge  micropuncture needle was advanced into the internal jugular vein and a micropuncture set was placed. Through this, a Wholey wire was directed to the inferior vena cava. Serial dilation was performed and a 16 French, 70 cm sheath was placed, directed under direct fluoroscopic guidance through the indwelling inferior vena cava filter. Then, from the thigh lower extremity access, an Amplatz wire was directed into the distal aspect of the sheath lumen and through and through flossing access was established. The indwelling 8 French sheath was then exchanged for a 16 [place] ClotTriever sheath after serial dilation under fluoroscopic guidance. The ClotTriever catheter was then advanced into the 16 French IJ sheath, the coring element was exposed, and mechanical thrombectomy was performed from the inferior vena cava through the femoral vein. A total of 6 passes with this technique were performed yielding large volume of acute and chronic appearing thrombus. Repeat venogram was performed through the indwelling popliteal  sheath which demonstrated significantly improved patency inflow of the femoral vein and inferior vena cava. There is significant flow-limiting focal stenosis in the mid femoral vein and at the saphenofemoral junction. Intravascular ultrasound was then again performed which demonstrated significantly improved patency of the femoral vein with the exception of echogenic wall adherent thrombus in the mid femoral vein in addition to at the saphenofemoral junction. There is persistent echogenic thrombus just inferior to the indwelling inferior vena cava filter, decreased from comparison. Balloon angioplasty was then performed in the mid femoral vein and at the saphenofemoral junction with a 12 mm by 4 cm atlas balloon. Repeat venogram demonstrated minimally improved patency at the 2 stenotic areas. Therefore, 2 additional passes with the ClotTriever device were performed from the common femoral vein through the  femoral vein yielding small volume chronic appearing thrombus. Repeat intravascular ultrasound and venogram demonstrated persistent flow limiting focal stenosis at the saphenofemoral junction and persistent chronic appearing thrombus within the inferior vena cava, just inferior to the indwelling filter. Therefore, the 78 [place] FlowTriever device was used to perform suction thrombectomy within the inferior aspect of the filter as well as in the femoral vein at multiple sites. Multiple acute on chronic thrombi were aspirated from the inferior vena cava. There is minimal yield in the femoral vein. Repeat intravascular ultrasound venogram demonstrated persistent flow limiting stenosis at the saphenofemoral junction and improved thrombus burden in the inferior vena cava. Therefore, repeat balloon angioplasty of saphenofemoral junction and in the mid femoral vein was performed with a 14 mm x 4 cm atlas balloon with 1 minute inflation times. Completion venography and intravascular ultrasound was then performed. There is significantly improved patency of the mid femoral vein focal stenosis. There is persistent chronic appearing thrombus but improved patency at the saphenofemoral junction. The distal aspect of the inferior vena cava is widely patent. There is persistent small volume chronic appearing thrombus just inferior to the indwelling inferior vena cava filter. The superior aspect of the inferior vena cava remains widely patent. The catheters and sheath were then removed. A per string stitch was placed at the popliteal access site. Manual compression was applied at both sites for approximately 5 minutes and hemostasis was achieved. The patient tolerated the procedure well and was returned to the floor in stable condition. FINDINGS: Severe mixed acute and chronic deep vein thrombosis extending from the right calf to the indwelling inferior vena cava. Technically successful mechanical thrombectomy, suction thrombectomy,  and balloon angioplasty. Small volume residual IVC and right femoral chronic appearing thrombus, most prominent at the saphenofemoral junction. IMPRESSION: 1. Severe mixed acute and chronic deep vein thrombosis extending from the right calf to the indwelling inferior vena cava. 2. Technically successful mechanical thrombectomy, suction thrombectomy, and balloon angioplasty from the right femoral vein to the inferior vena cava. 3. Small volume residual inferior vena cava and right femoral chronic appearing thrombus. PLAN: 1. Bilateral thigh high compression stockings (20-30 mmHg) at all times. Continue compression stocking use after discharge. 2. Bilateral SCDs at all times unless ambulating. 3. Continue anticoagulation. 4. Follow up in IR clinic in 2 months with CTV abdomen/pelvis. Marliss Coots, MD [facility] Radiology Electronically Signed   By: Marliss Coots MD   On: 11/03/2020 08:38    Labs:  CBC: Recent Labs    10/23/20 0751 10/25/20 1610 10/27/20 0144 10/28/20 0226 10/29/20 0706  WBC 8.4 8.3 7.2  --  6.1  HGB 9.2* 9.0* 9.2* 8.3* 9.0*  HCT 29.6*  28.7* 29.2* 25.6* 28.3*  PLT 175 181 199  --  227    COAGS: Recent Labs    09/25/20 0309 10/25/20 1041  INR 1.0 1.1  APTT 27 34    BMP: Recent Labs    12/13/19 1155 03/02/20 0853 09/25/20 0309 10/03/20 4540 10/03/20 0854 10/28/20 0226 10/28/20 1845 10/29/20 0706 11/06/20 0510  NA 139 141   < > QUESTIONABLE RESULTS, RECOMMEND RECOLLECT TO VERIFY   < > 133* 134* 133* 134*  K 4.3 4.1   < > QUESTIONABLE RESULTS, RECOMMEND RECOLLECT TO VERIFY   < > 4.0 4.0 4.0 4.0  CL 102 103   < > QUESTIONABLE RESULTS, RECOMMEND RECOLLECT TO VERIFY   < > 101 101 100 101  CO2 27 23   < > QUESTIONABLE RESULTS, RECOMMEND RECOLLECT TO VERIFY   < > 24 25 25 26   GLUCOSE 103* 115*   < > QUESTIONABLE RESULTS, RECOMMEND RECOLLECT TO VERIFY   < > 137* 107* 122* 123*  BUN 13 13   < > QUESTIONABLE RESULTS,  RECOMMEND RECOLLECT TO VERIFY   < > 12 8 7* 7*  CALCIUM 9.8 9.4   < > QUESTIONABLE RESULTS, RECOMMEND RECOLLECT TO VERIFY   < > 8.4* 8.6* 8.6* 9.0  CREATININE 1.04 0.98   < > QUESTIONABLE RESULTS, RECOMMEND RECOLLECT TO VERIFY   < > 0.68 0.68 0.68 0.70  GFRNONAA >60 81   < > QUESTIONABLE RESULTS, RECOMMEND RECOLLECT TO VERIFY   < > >60 >60 >60 >60  GFRAA >60 94  --  QUESTIONABLE RESULTS, RECOMMEND RECOLLECT TO VERIFY  --   --   --   --   --    < > = values in this interval not displayed.    LIVER FUNCTION TESTS: Recent Labs    10/18/20 0734 10/22/20 0326 10/27/20 1053 10/29/20 0706  BILITOT 1.0 0.8 0.8 0.9  AST 61* 37 39 27  ALT 136* 79* 57* 45*  ALKPHOS 129* 116 95 84  PROT 6.3* 5.6* 5.7* 5.9*  ALBUMIN 2.8* 2.5* 2.9* 2.9*    Assessment and Plan:  S/p Right lower extremity venogram,IVCgram and mechanicalthrombectomy ofinferior vena cava, right internal and external iliac, common femoral, and femoral veins, balloon angioplasty of the right femoral and common femoral veinson 5/5with Dr. Elby Showers  Patient stable, no leg complaints. Remains on tracheostomy.  On Eliquis 5 mg BID. Patient not on tigh high compression stockings, pt keeps taking off.  Informed patient the importance of the compression stockings, and possible recurrent DVT if patient keeps taking them off.  Patient verbalized understanding.  Informed RN to place patient on thigh high compression stockings at all time except ambulating.   Further treatment plan per PCCM/ PT/ OT  Appreciate and agree with the plan.  IR to follow remotely.  Please call IR for questions regarding DVT thrombectomy.    Electronically Signed: Willette Brace, PA-C 11/06/2020, 10:14 AM   I spent a total of 15 Minutes at the the patient's bedside AND on the patient's hospital floor or unit, greater than 50% of which was counseling/coordinating care for  DVT thrombectomy.

## 2020-11-06 NOTE — Progress Notes (Signed)
PROGRESS NOTE   Subjective/Complaints:  Discussed trach weaning with CCM NP, plan is for capping x 48hr and monitor for desat  Eating well , has occ cough this am which improved after a few sips of H20 ROS-   Pt denies SOB, abd pain, CP, N/V/C/D, and vision changes  Objective:   No results found. No results for input(s): WBC, HGB, HCT, PLT in the last 72 hours. Recent Labs    11/06/20 0510  NA 134*  K 4.0  CL 101  CO2 26  GLUCOSE 123*  BUN 7*  CREATININE 0.70  CALCIUM 9.0    Intake/Output Summary (Last 24 hours) at 11/06/2020 0819 Last data filed at 11/06/2020 3546 Gross per 24 hour  Intake 360 ml  Output 300 ml  Net 60 ml     Pressure Injury 10/21/20 Head Left;Posterior Stage 1 -  Intact skin with non-blanchable redness of a localized area usually over a bony prominence. Pink non-blanchable scalp with thinning hair on posterior left side of head (Active)  10/21/20 1100  Location: Head  Location Orientation: Left;Posterior  Staging: Stage 1 -  Intact skin with non-blanchable redness of a localized area usually over a bony prominence.  Wound Description (Comments): Pink non-blanchable scalp with thinning hair on posterior left side of head  Present on Admission: No    Physical Exam: Vital Signs Blood pressure 118/65, pulse 76, temperature 98.2 F (36.8 C), resp. rate 18, weight 94.4 kg, SpO2 97 %.  ENT- trach #6 Shiley, Cortrak tube  Throat is dry   General: No acute distress Mood and affect are appropriate Heart: Regular rate and rhythm no rubs murmurs or extra sounds Lungs: Clear to auscultation, breathing unlabored, no rales or wheezes Abdomen: Positive bowel sounds, soft nontender to palpation, nondistended Extremities: No clubbing, cyanosis, or edema   Neurologic: , motor strength is 4/5 in bilateral deltoid, bicep, tricep, grip, hip flexor, knee extensors, ankle dorsiflexor and plantar  flexor Sensory exam normal sensation to light touch and proprioception in bilateral upper and lower extremities Cerebellar exam severe dysmetria Left FNF     Musculoskeletal: Full range of motion in all 4 extremities. No joint swelling, no joint effusion R foot, No pain with ROM    Assessment/Plan: 1. Functional deficits which require 3+ hours per day of interdisciplinary therapy in a comprehensive inpatient rehab setting.  Physiatrist is providing close team supervision and 24 hour management of active medical problems listed below.  Physiatrist and rehab team continue to assess barriers to discharge/monitor patient progress toward functional and medical goals  Care Tool:  Bathing    Body parts bathed by patient: Right arm,Left arm,Chest,Abdomen,Front perineal area,Face   Body parts bathed by helper: Buttocks,Right upper leg,Left upper leg,Right lower leg,Left lower leg     Bathing assist Assist Level: 2 Helpers     Upper Body Dressing/Undressing Upper body dressing   What is the patient wearing?: Pull over shirt    Upper body assist Assist Level: Minimal Assistance - Patient > 75%    Lower Body Dressing/Undressing Lower body dressing      What is the patient wearing?: Pants     Lower body assist Assist for  lower body dressing: Minimal Assistance - Patient > 75%     Toileting Toileting    Toileting assist Assist for toileting: Dependent - Patient 0% (stedy for STS, otherwise min A)     Transfers Chair/bed transfer  Transfers assist  Chair/bed transfer activity did not occur: Safety/medical concerns  Chair/bed transfer assist level: 2 Helpers     Locomotion Ambulation   Ambulation assist   Ambulation activity did not occur: Safety/medical concerns          Walk 10 feet activity   Assist  Walk 10 feet activity did not occur: Safety/medical concerns        Walk 50 feet activity   Assist Walk 50 feet with 2 turns activity did not occur:  Safety/medical concerns         Walk 150 feet activity   Assist Walk 150 feet activity did not occur: Safety/medical concerns         Walk 10 feet on uneven surface  activity   Assist Walk 10 feet on uneven surfaces activity did not occur: Safety/medical concerns         Wheelchair     Assist Will patient use wheelchair at discharge?:  (TBD)             Wheelchair 50 feet with 2 turns activity    Assist            Wheelchair 150 feet activity     Assist          Blood pressure 118/65, pulse 76, temperature 98.2 F (36.8 C), resp. rate 18, weight 94.4 kg, SpO2 97 %.  Medical Problem List and Plan: 1. Right side weakness and slurred speech secondary to intracerebral hemorrhage complicated by hydrocephalus. Status post EVD placed per neurosurgery -patient may not shower -ELOS/Goals: 5/26 -con't PT and OT and SLP 2. Acute PE/bilateral lower extremity DVT: Status post IVC filter 10/08/2020 with thrombectomy and retrieval of IVC filter 10/28/2020 per interventional radiology (still has suprarenal filter ) No RLE, scheduled for repeat thrombectomy this week -DVT/anticoagulation: Continue Eliquis- unless this needs to be stopped for IR procedure  5/7- per IR< also needs SCDs at all times in bed- IR went over with nursing. Also, wrote order for R popliteal area to have sutures removed.    5/8- pt keeps removing them per nursing- family to try and keep them in place -antiplatelet therapy: N/A 3. Pain Management: Decrease oxycodone to q12H prn  5/8- pt denies pain- con't regimen 4. Mood: Amantadine 200 mg twice daily, may need to reduce if pt becomes agitated  -antipsychotic agents: N/A 5. Neuropsych: This patient is not capable of making decisions on his own behalf. 6. Skin/Wound Care: Routine skin checks 7. Fluids/Electrolytes/Nutrition: Routine in and outs with follow-up  chemistries 8. PAF. Status post ablation cardioversion in the Past. Continue Eliquis. Cardio exam 30 mg every 6 hours. Cardiac rate controlled 9. Acute hypoxic respiratory failure. Tracheostomy 10/04/2020 per DrChand. Presently with a #6 Shiley. PMV as tolerated. Follow-up speech therapy. Daily weights. 10. Hospital-acquired pneumonia/ventilator associated pneumonia. Infectious disease follow-up antibiotic therapy completed  5/7- a few rhonchi on exam- per pt, no SOB- has no complaints about breathing issues.  5/8- sounds good- cn't regimen  11. Dysphagia. Dysphagia #3 thin liquids.  intake good will d/c Cortrak Feeding tube 12. Hyperglycemia related to tube feeds. Lantus insulin 6 units daily. Continue SSI. Hemoglobin A1c was 5.7   CBG (last 3)  Recent Labs    11/05/20 2006 11/06/20  0003 11/06/20 0408  GLUCAP 106* 132* 121*  will d/c CBG not diabetic will be off TF  13.  RIght foot pain ? Tibial nerve irritation post procedure circulation is good, no MSK exam abnl 14. Insomnia  5/7- will add trazodone for sleep- 50 mg QHS  5/8- increase to 100 mg QHS   LOS: 9 days A FACE TO FACE EVALUATION WAS PERFORMED  Erick Colace 11/06/2020, 8:19 AM

## 2020-11-06 NOTE — Progress Notes (Signed)
Nutrition Follow-up  DOCUMENTATION CODES:   Non-severe (moderate) malnutrition in context of acute illness/injury  INTERVENTION:  Continue Ensure Enlive po TID, each supplement provides 350 kcal and 20 grams of protein.  Continue Magic cup BID with meals, each supplement provides 290 kcal and 9 grams of protein.  Encourage PO intake.   NUTRITION DIAGNOSIS:   Moderate Malnutrition related to acute illness (intraparencymal hemorrhage) as evidenced by mild fat depletion,moderate muscle depletion,percent weight loss (7.6% weight loss in less than 3 months); ongoing  GOAL:   Patient will meet greater than or equal to 90% of their needs; progressing  MONITOR:   PO intake,Supplement acceptance,TF tolerance,Weight trends  REASON FOR ASSESSMENT:   Consult Enteral/tube feeding initiation and management  ASSESSMENT:   65 year old male with PMH of atrial fibrillation with ablation. Presented 09/17/20 with right-sided weakness, headache, dysarthria, and vomiting. Cranial CT/MRI showed acute intraparenchymal hemorrhage. Pt required emergent intubation for airway protection. EVD was placed per neurosurgery. Pt underwent tracheostomy 10/04/20. Pt initially NPO and is now on a dysphagia 3 diet with thin liquids. Cortrak tube in place for nutrition support.  5/05 - s/p right lower extremity venogram, intravascular ultrasound, right cavo-ilio-femoral venous mechanical thrombectomy, right femoral and common femoral balloon angioplasty  Noted target d/c date of 11/23/20.  Pt currently on a dysphagia 2 diet with thin liquids. Meal completion has been mostly 80-100% recently. Pt reports having a good appetite. Pt currently has Ensure ordered TID and has been consuming them. Cortak NGT removed this morning per MD orders due to improved PO intake. Tube feeds have been discontinued. RD to continue with Ensure and Magic cup supplementation to aid in caloric and protein needs. Pt encouraged to eat his food at  meals and to drink his supplements. Trach capping trial x 48 hours initiated.   Labs and medications reviewed.   Diet Order:   Diet Order            DIET DYS 2 Room service appropriate? Yes; Fluid consistency: Thin  Diet effective now                 EDUCATION NEEDS:   No education needs have been identified at this time  Skin:  Skin Assessment: Reviewed RN Assessment Skin Integrity Issues:: Other (Comment),Incisions,Stage I Stage I: left head Incisions: neck Other: MASD rectum, puncture to left groin, right knee, right neck  Last BM:  5/8  Height:   Ht Readings from Last 1 Encounters:  09/25/20 6\' 2"  (1.88 m)    Weight:   Wt Readings from Last 1 Encounters:  11/05/20 94.4 kg   BMI:  Body mass index is 26.72 kg/m.  Estimated Nutritional Needs:   Kcal:  2300-2500  Protein:  115-130 grams  Fluid:  > 2.0 L  01/05/21, MS, RD, LDN RD pager number/after hours weekend pager number on Amion.

## 2020-11-06 NOTE — Progress Notes (Signed)
RT at bedside to assess pt during morning rounds. RT suctioned pt with minimal secretions. Pt tolerated well with SVS.

## 2020-11-06 NOTE — Progress Notes (Signed)
Speech Language Pathology Weekly Progress and Session Note  Patient Details  Name: Walter [name] MRN: [id number] Date of Birth: Jan 04, 1956  Beginning of progress report period: Oct 30, 2020 End of progress report period: Nov 06, 2020  Today's Date: 11/06/2020 SLP Individual Time: 4665-9935 SLP Individual Time Calculation (min): 40 min  Short Term Goals: Week 1: SLP Short Term Goal 1 (Week 1): Patient will utilize an increased vocal intensity at the phrase leve to achieve ~50% intelligibility with Mod verbal cues. SLP Short Term Goal 1 - Progress (Week 1): Met SLP Short Term Goal 2 (Week 1): Patient will consume current diet with minimal overt s/s of aspiration with Mod verbal cues for use of swallowing compensatory strategies. SLP Short Term Goal 2 - Progress (Week 1): Not met SLP Short Term Goal 3 (Week 1): Patient will demonstrate sustained attention to functional tasks for 15 minutes with Mod verbal cues for redirection. SLP Short Term Goal 3 - Progress (Week 1): Met SLP Short Term Goal 4 (Week 1): Patient will utilize external aids and contextual cues for orientation with Mod multimodal cues. SLP Short Term Goal 4 - Progress (Week 1): Met SLP Short Term Goal 5 (Week 1): Patient will demonstrate functional problem solving for basic and familiar tasks with Max A multimodal cues. SLP Short Term Goal 5 - Progress (Week 1): Met    New Short Term Goals: Week 2: SLP Short Term Goal 1 (Week 2): Patient will utilize an increased vocal intensity at the phrase leve to achieve ~75% intelligibility with Mod verbal cues. SLP Short Term Goal 2 (Week 2): Patient will consume current diet with minimal overt s/s of aspiration with Mod verbal cues for use of swallowing compensatory strategies. SLP Short Term Goal 3 (Week 2): Patient will demonstrate sustained attention to functional tasks for 30 minutes with Mod verbal cues for redirection. SLP Short Term Goal 4 (Week 2): Patient will utilize external  aids and contextual cues for orientation with Min multimodal cues. SLP Short Term Goal 5 (Week 2): Patient will demonstrate functional problem solving for basic and familiar tasks with Mod A multimodal cues.  Weekly Progress Updates: Patient has made functional gains and has met 4 of 5 STGs this reporting period. Currently, patient is tolerating his PMSV during all waking hours and requires moderate verbal cues for use of an increased vocal intensity to achieve ~50% intelligibility at the phrase level. Patient's diet was recently downgraded to Dys. 2 textures due to consistent overt s/s of aspiration with solid textures, suspect due to impulsivity with Mod verbal cues needed for use of swallowing compensatory strategies. Patient also requires overall Mod-Max A verbal cues to complete functional and familiar tasks safely in regards to problem solving, attention and recall. Patient and family education ongoing. Patient would benefit from continued skilled SLP intervention to maximize his cognitive, speech and swallowing function prior to discharge.      Intensity: Minumum of 1-2 x/day, 30 to 90 minutes Frequency: 3 to 5 out of 7 days Duration/Length of Stay: 11/23/20 Treatment/Interventions: Cognitive remediation/compensation;Dysphagia/aspiration precaution training;Cueing hierarchy;Environmental controls;Functional tasks;Patient/family education;Therapeutic Activities;Internal/external aids;Speech/Language facilitation   Daily Session  Skilled Therapeutic Interventions: Skilled treatment session focused on dysphagia and cognitive goals. Upon arrival, patient was awake and requested to use the urinal. Patient was continent of urine but had had an episode of incontinence earlier in the morning resulting in soaking his brief and bed pad. SLP facilitated session by providing extra time and overall Min verbal and tactile cues for  bed mobility. PMSV was donned and patient consumed breakfast meal of Dys. 2  textures with thin liquids without overt s/s of aspiration. However, Mod verbal cues were needed for use of swallowing compensatory strategies. Recommend patient continue current diet with full supervision. Patient left upright in bed with NT present. Continue with current plan of care.      Pain No/Denies Pain   Therapy/Group: Individual Therapy  Deshanae Lindo 11/06/2020, 6:34 AM

## 2020-11-06 NOTE — Progress Notes (Signed)
Speech Language Pathology Daily Session Note  Patient Details  Name: Walter [name] MRN: [id number] Date of Birth: 09-21-1955  Today's Date: 11/06/2020 SLP Individual Time: 1015-1045 SLP Individual Time Calculation (min): 30 min  Short Term Goals: Week 2: SLP Short Term Goal 1 (Week 2): Patient will utilize an increased vocal intensity at the phrase leve to achieve ~75% intelligibility with Mod verbal cues. SLP Short Term Goal 2 (Week 2): Patient will consume current diet with minimal overt s/s of aspiration with Mod verbal cues for use of swallowing compensatory strategies. SLP Short Term Goal 3 (Week 2): Patient will demonstrate sustained attention to functional tasks for 30 minutes with Mod verbal cues for redirection. SLP Short Term Goal 4 (Week 2): Patient will utilize external aids and contextual cues for orientation with Min multimodal cues. SLP Short Term Goal 5 (Week 2): Patient will demonstrate functional problem solving for basic and familiar tasks with Mod A multimodal cues.  Skilled Therapeutic Interventions: Skilled treatment session focused on cognitive goals. SLP facilitated session by providing extra time and overall Min A verbal cues for recall and error awareness during a basic money management task. Patient left upright in bed with alarm on, all needs within reach and PMSV removed. Continue with current plan of care.      Pain No/Denies Pain   Therapy/Group: Individual Therapy  Pamla Pangle 11/06/2020, 10:28 AM

## 2020-11-06 NOTE — Progress Notes (Signed)
Occupational Therapy Session Note  Patient Details  Name: Walter [name] MRN: [id number] Date of Birth: May 19, 1956  Today's Date: 11/06/2020 OT Individual Time: 1130-1200 OT Individual Time Calculation (min): 30 min    Short Term Goals: Week 1:  OT Short Term Goal 1 (Week 1): Pt will complete BSC or toilet transfer with 1 assist and LRAD OT Short Term Goal 2 (Week 1): Pt will engage in self care or therapeutic activity while sitting unsupported for ~20 minutes with no more than Mod balance assistance OT Short Term Goal 3 (Week 1): Pt will be able to visually scan for 1 ADL item and successfully reach for it with no more than Mod A  Skilled Therapeutic Interventions/Progress Updates:    Treatment session with focus on sitting balance, visual scanning/attention, and functional reach.  Pt received supine in bed agreeable to therapy session.  Therapist applied shorts while pt in supine, max assist.  Pt able to bridge while in supine and pull shorts over hips with increased time and only CGA due to impulsivity.  Pt completed bed mobility with mod-max assist to come to sitting EOB.  Pt demonstrating L lean requiring increased multimodal cues for weight shifting to obtain midline sitting.  Engaged in visual scanning and reaching with RUE for colored pegs in sitting with focus on reaching to R out of BOS to facilitate increased weight shift for midline sitting posture.  Incorporated reaching with LUE, noted increased ataxia with LUE and decreased trunk control.  Pt returned to supine and left semi-reclined in bed with all needs in reach.  Rehab tech present throughout and providing assistance and cues due to fluctuations in mobility and impulsivity.  Therapy Documentation Precautions:  Precautions Precautions: Fall,Other (comment) Precaution Comments: NG tube, trach, PMSV, R hemiparesis, hx of DVTs and PEs Restrictions Weight Bearing Restrictions: No General:   Vital Signs: Therapy Vitals Temp:  98.5 F (36.9 C) Pulse Rate: 92 Resp: 19 BP: 123/76 Patient Position (if appropriate): Lying Oxygen Therapy SpO2: 97 % O2 Device: Room Air Pain:  Pt with no c/o pain   Therapy/Group: Individual Therapy  Rosalio Loud 11/06/2020, 3:25 PM

## 2020-11-06 NOTE — Progress Notes (Signed)
Occupational Therapy Session Note  Patient Details  Name: Walter [name] MRN: [id number] Date of Birth: 1955/07/13  Today's Date: 11/06/2020 OT Individual Time: 3664-4034 OT Individual Time Calculation (min): 51 min    Short Term Goals: Week 1:  OT Short Term Goal 1 (Week 1): Pt will complete BSC or toilet transfer with 1 assist and LRAD OT Short Term Goal 2 (Week 1): Pt will engage in self care or therapeutic activity while sitting unsupported for ~20 minutes with no more than Mod balance assistance OT Short Term Goal 3 (Week 1): Pt will be able to visually scan for 1 ADL item and successfully reach for it with no more than Mod A  Skilled Therapeutic Interventions/Progress Updates:    Pt in bed to start session with neurologist just leaving the room.  Neurologist discussed pt's diplopia with therapist and requested taping to pt's glasses, as he evidently kept his left eye closed throughout conversation with neurologist earlier.  Pt's trach capped and O2 sats at 93% on room air.  He transitioned to sitting where they continued to stay above 90%.  Pt completed supine to sit EOB with min assist.  Static sitting balance initially at min assist but decreasing to mod assist with LOB to the left and posteriorly.  Had therapy tech help stabilize him in order to look more at his diplopia.  He reported single vision to the left of midline, but needed mod instructional cueing to keep both eyes open when looking at the target as he would keep closing one or the other.  He then reported greater areas of diplopia on the left of midline compared to the right.  Therapist provided unilateral nasal occlusion to the right lens to help decrease diplopia in the left visual field.  Next, had pt work on standing with use of the RW for support.  Mod demonstrational cueing for hand placement with sit to stand.  He was able to stand statically with overall mod assist and O2 sats at 92-96% on capped trach.  HR increased up to  113.  He tolerated standing for approximately 2 mins before needing to rest.  Next, had him ambulate to the door and back with the bariatric RW.  Total +2 (pt 40%) to complete secondary to not being able to push the RW efficiently with the LUE and turning it to the right as well as demonstrated severe scissoring of the right and left during mobility.  Returned to the EOB where he rested and then requested use of the toilet before returning to supine.  Antony Salmon was used for safety to and from the toilet with min assist for sit to stand in the [place] and mod assist for clothing management before and after urination.  He transition to supine with mod assist where he was left with the call button and phone in reach as well as safety bed alarm in place.   Therapy Documentation Precautions:  Precautions Precautions: Fall Precaution Comments: NG tube, trach, PMSV, R hemiparesis, hx of DVTs and PEs Other Brace: BUE resting hand splints, see schedule Restrictions Weight Bearing Restrictions: No  Pain: Pain Assessment Pain Scale: Faces Pain Score: 0-No pain ADL: See Care Tool Section for some details of mobility and selfcare  Therapy/Group: Individual Therapy  Braiden Presutti OTR/L 11/06/2020, 4:26 PM

## 2020-11-06 NOTE — Progress Notes (Signed)
NAME:  Walter [name], MRN:  [id number], DOB:  08-08-1955, LOS: 9 ADMISSION DATE:  10/28/2020, CONSULTATION DATE:  5/2 REFERRING MD:  Kisteins , CHIEF COMPLAINT:  Trach management    History of Present Illness:  This is a 65 year old male who was initially admitted to [facility] On 3/28 after presenting for a code stroke with initial symptoms of headache and right-sided weakness.  Admitted for intraparenchymal hemorrhage involving the left pons with subarachnoid extension.  Course complicated by ineffective airway clearance requiring intubation, cerebral edema, and intraventricular hemorrhage.  Progress in ICU was slow, course complicated by failed attempt at extubation requiring tracheostomy placement on 4/6.  Also Exidine of bacterial bacteremia, significant deconditioning, and bilateral DVT requiring IVC placement.  Ultimately liberated from mechanical ventilation, and on 4/22 moved out of the intensive care.  She was supported on the medical ward from the 22nd up until 4/29 at which time she was transferred to acute rehab.  Pulmonary asked to follow for tracheostomy management.  Pertinent  Medical History  Atrial fibrillation with prior ablation not on anticoagulation. Acute intraparenchymal hemorrhage involving the dorsal left pons with subarachnoid extension.  Complicated by decreased functional ability and right-sided hemiparesis with cognitive deficits Grade 1 diastolic dysfunction Lower ext DVT c/b obstructed/clotted IVC filter req mechanical thrombectomy and removal of IVC filter.  Prolonged mechanical ventilation with ineffective airway clearance requiring tracheostomy  Significant Hospital Events: Including procedures, antibiotic start and stop dates in addition to other pertinent events   . 3/28 admitted with intraparenchymal hemorrhage required intubation  3/29- EVD placement for developing hydrocephalus. CTA of the head was unremarkable  3/30- Extubated.  4/1  respiratory culture grew Moraxella, on ceftriaxone  4/4 reintubated because of hypoxia and unresponsiveness  4/6 tracheostomy  4/7 still encephalopathic   4/8 remains encephalopathic  4/10 Doppler shows bilateral DVT. IVC filter placed.   4/11 Blood cultures positive for Acinetobacter   4/12 Fever no worse off [place], off sedative infusions, tolerating trach collar.   4/13 Febrile @ 7am  4/14 - 4/17 4/16 Afebrile Intermittently following simple commands and participated in PT, drowsy today  4/18 febrile. Starting unasyn, sending trach asp  4/19 T Max 101.7, WBC 12.2, ALT elevated, profoundly deconditioned  4/20 awake and following commands. Trach aspirate w few GPC.   4/21 Tolerated trach collar >24h  4/22 D1 diet, transfer out of ICU 4/27 increased LE edema had Venogram showed =Positive for thrombus involving the IVC and iliac veins. Large amount of thrombus involving the infrarenal IVC and IVC filter.Suprarenal IVC is patent. Thrombus in the right iliac veins and thrombus in the proximal femoral veins bilaterally  4/29 Underwent mechanical thrombectomy,removal of IVC filter.  trach changed to cuffless.  to rehab   5/3 Pulm consulted for trach   5/5 for repeat trip to IR for further thrombectomy of residual thrombus in IVC  5/8: Had some trouble with coughing with meals, diet changed  5/9 initiate capping trials with overnight oximetry   Interim History / Subjective:  Doing well Objective   Blood pressure 118/65, pulse 85, temperature 98.2 F (36.8 C), resp. rate 18, weight 94.4 kg, SpO2 97 %.    FiO2 (%):  [21 %-28 %] 21 %   Intake/Output Summary (Last 24 hours) at 11/06/2020 0953 Last data filed at 11/06/2020 0713 Gross per 24 hour  Intake 360 ml  Output 300 ml  Net 60 ml   Filed Weights   11/03/20 0500 11/04/20 0450 11/05/20 0423  Weight:  87.7 kg 91.2 kg 94.4 kg    Examination: General 65 year old male patient resting in bed he is in no acute  distress HEENT normocephalic atraumatic no jugular venous distention.  Does have some left-sided facial droop.  His size 6 cuffless tracheostomy is unremarkable there is no ulceration noted minimal tracheostomy secretions.  Tolerates finger occlusion, tolerates PMV well. Pulmonary: Clear to auscultation no accessory use currently 28% aerosol trach collar Cardiac: Regular irregular atrial fibrillation Abdomen: Soft nontender has core track Extremities: Warm dry brisk cap refill Neuro: Awake oriented appropriate.  Labs/imaging that I havepersonally reviewed  (right click and "Reselect all SmartList Selections" daily)  See below  Resolved Hospital Problem list     Assessment & Plan:  Trach dependence secondary to ineffective airway clearance  S/p Intraparenchymal hemorrhage w/ right sided hemiplegia Bilateral DVT Deconditioning  Htn H/o afib H/o urinary retention Macrocytic anemia   Pulm problem list  Trach dependence   Discussion He is continuing to do well from a tracheostomy standpoint.  He has had some trouble with dysphagia, I wonder if this could be secondary to the feeding tube as well.  He does excellent with PMV, his voice quality is soft but improving I think.  He tolerates finger occlusion well.  He is improving in regards to his activity endurance.  He tells me prior to his stroke they were working him up for sleep apnea because of his atrial fibrillation.  I do think prior to decannulation we need to ensure he is not having significant hypoxic events.  This being said I think he is ready to start capping trials. Plan Initiate capping trials  Overnight oximetry for the next 48 hours will place order  Will instruct nursing staff to record in the progress notes any episodes of desaturation and or need for suction   I will see him again on Thursday, if no evidence of desaturation, and no need for suctioning I think we could consider decannulation.  However if he is having  significant desaturation at night given the question of possible sleep apnea we would need to send him home with the current tracheostomy, and set him up for a sleep study and ensure he had proper therapy prior to decannulation    .    Best practice (right click and "Reselect all SmartList Selections" daily)  Per primary   Simonne Martinet ACNP-BC [facility] Pulmonary/Critical Care Pager # [phone] OR # [phone] if no answer

## 2020-11-07 LAB — GLUCOSE, CAPILLARY
Glucose-Capillary: 109 mg/dL — ABNORMAL HIGH (ref 70–99)
Glucose-Capillary: 109 mg/dL — ABNORMAL HIGH (ref 70–99)
Glucose-Capillary: 103 mg/dL — ABNORMAL HIGH (ref 70–99)

## 2020-11-07 MED ORDER — TRAZODONE HCL 50 MG PO TABS
25.0000 mg | ORAL_TABLET | Freq: Every day | ORAL | Status: DC
  Administered 2020-11-07 – 2020-11-22 (×16): 25 mg via ORAL
  Filled 2020-11-07 (×16): qty 1

## 2020-11-07 MED ORDER — OXYCODONE HCL 5 MG PO TABS
5.0000 mg | ORAL_TABLET | Freq: Two times a day (BID) | ORAL | Status: DC | PRN
Start: 2020-11-07 — End: 2020-11-13

## 2020-11-07 MED ORDER — DILTIAZEM 12 MG/ML ORAL SUSPENSION: 30.0000 mg | Freq: Three times a day (TID) | ORAL | Status: DC

## 2020-11-07 MED ORDER — APIXABAN 5 MG PO TABS
5.0000 mg | ORAL_TABLET | Freq: Two times a day (BID) | ORAL | Status: DC
  Administered 2020-11-07 – 2020-11-23 (×32): 5 mg via ORAL
  Filled 2020-11-07 (×32): qty 1

## 2020-11-07 MED ORDER — ACETAMINOPHEN 325 MG PO TABS: 650.0000 mg | ORAL_TABLET | ORAL | Status: DC | PRN

## 2020-11-07 MED ORDER — CLONAZEPAM 0.125 MG PO TBDP
0.1250 mg | ORAL_TABLET | Freq: Every day | ORAL | Status: DC
  Administered 2020-11-07 – 2020-11-09 (×3): 0.125 mg via ORAL
  Filled 2020-11-07 (×3): qty 1

## 2020-11-07 MED ORDER — AMANTADINE HCL 100 MG PO CAPS
200.0000 mg | ORAL_CAPSULE | Freq: Two times a day (BID) | ORAL | Status: DC
  Administered 2020-11-07 – 2020-11-15 (×16): 200 mg via ORAL
  Filled 2020-11-07 (×16): qty 2

## 2020-11-07 MED ORDER — DILTIAZEM HCL 30 MG PO TABS
30.0000 mg | ORAL_TABLET | Freq: Three times a day (TID) | ORAL | Status: DC
  Administered 2020-11-07 – 2020-11-23 (×48): 30 mg via ORAL
  Filled 2020-11-07 (×50): qty 1

## 2020-11-07 MED ORDER — GUAIFENESIN-DM 100-10 MG/5ML PO SYRP: 5.0000 mL | ORAL_SOLUTION | ORAL | Status: DC | PRN

## 2020-11-07 MED ORDER — DOXAZOSIN MESYLATE 2 MG PO TABS
2.0000 mg | ORAL_TABLET | Freq: Every day | ORAL | Status: DC
  Administered 2020-11-08 – 2020-11-23 (×16): 2 mg via ORAL
  Filled 2020-11-07 (×16): qty 1

## 2020-11-07 NOTE — Plan of Care (Signed)
  Problem: Consults Goal: RH STROKE PATIENT EDUCATION Description: See Patient Education module for education specifics  Outcome: Progressing   Problem: RH BOWEL ELIMINATION Goal: RH STG MANAGE BOWEL WITH ASSISTANCE Description: STG Manage Bowel with  mod I Assistance. Outcome: Progressing   Problem: RH BLADDER ELIMINATION Goal: RH STG MANAGE BLADDER WITH ASSISTANCE Description: STG Manage Bladder With  mod I Assistance Outcome: Progressing   Problem: RH SKIN INTEGRITY Goal: RH STG SKIN FREE OF INFECTION/BREAKDOWN Description: Min assist Outcome: Progressing Goal: RH STG MAINTAIN SKIN INTEGRITY WITH ASSISTANCE Description: STG Maintain Skin Integrity With min  Assistance. Outcome: Progressing Goal: RH STG ABLE TO PERFORM INCISION/WOUND CARE W/ASSISTANCE Description: STG Able To Perform Incision/Wound Care With min Assistance. Outcome: Progressing   Problem: RH SAFETY Goal: RH STG ADHERE TO SAFETY PRECAUTIONS W/ASSISTANCE/DEVICE Description: STG Adhere to Safety Precautions With cues/reminders Assistance/Device. Outcome: Progressing   Problem: RH KNOWLEDGE DEFICIT Goal: RH STG INCREASE KNOWLEDGE OF DIABETES Description: Patient and wife will be able to manage DM with medications and dietary modifications using handouts and educational tools independently Outcome: Progressing Goal: RH STG INCREASE KNOWLEDGE OF HYPERTENSION Description: Patient and wife will be able to manage HTN with medications and dietary modifications using handouts and educational tools independently Outcome: Progressing Goal: RH STG INCREASE KNOWLEDGE OF DYSPHAGIA/FLUID INTAKE Description: Patient and wife will be able to manage Dysphagia, medications and dietary modifications using handouts and educational tools independently Outcome: Progressing Goal: RH STG INCREASE KNOWLEGDE OF HYPERLIPIDEMIA Description: Patient and wife will be able to manage HLD with medications and dietary modifications using  handouts and educational tools independently Outcome: Progressing Goal: RH STG INCREASE KNOWLEDGE OF STROKE PROPHYLAXIS Description: Patient and wife will be able to manage Secondary stroke risks with medications and dietary modifications using handouts and educational tools independently Outcome: Progressing   

## 2020-11-07 NOTE — Progress Notes (Signed)
PROGRESS NOTE   Subjective/Complaints:  Remained capped all night  ROS-   Pt denies SOB, abd pain, CP, N/V/C/D, and vision changes  Objective:   No results found. No results for input(s): WBC, HGB, HCT, PLT in the last 72 hours. Recent Labs    11/06/20 0510  NA 134*  K 4.0  CL 101  CO2 26  GLUCOSE 123*  BUN 7*  CREATININE 0.70  CALCIUM 9.0    Intake/Output Summary (Last 24 hours) at 11/07/2020 0740 Last data filed at 11/07/2020 0100 Gross per 24 hour  Intake 120 ml  Output 200 ml  Net -80 ml     Pressure Injury 10/21/20 Head Left;Posterior Stage 1 -  Intact skin with non-blanchable redness of a localized area usually over a bony prominence. Pink non-blanchable scalp with thinning hair on posterior left side of head (Active)  10/21/20 1100  Location: Head  Location Orientation: Left;Posterior  Staging: Stage 1 -  Intact skin with non-blanchable redness of a localized area usually over a bony prominence.  Wound Description (Comments): Pink non-blanchable scalp with thinning hair on posterior left side of head  Present on Admission: No    Physical Exam: Vital Signs Blood pressure 127/72, pulse 81, temperature 98.4 F (36.9 C), temperature source Oral, resp. rate 18, weight 90.9 kg, SpO2 96 %.  ENT- trach #6 Shiley, capped  Throat is dry   General: No acute distress Mood and affect are appropriate Heart: Regular rate and rhythm no rubs murmurs or extra sounds Lungs: Clear to auscultation, breathing unlabored, no rales or wheezes Abdomen: Positive bowel sounds, soft nontender to palpation, nondistended Extremities: No clubbing, cyanosis, or edema   Neurologic: , motor strength is 4/5 in bilateral deltoid, bicep, tricep, grip, hip flexor, knee extensors, ankle dorsiflexor and plantar flexor Sensory exam normal sensation to light touch and proprioception in bilateral upper and lower extremities Cerebellar  exam severe dysmetria Left FNF     Musculoskeletal: Full range of motion in all 4 extremities. No joint swelling, no joint effusion R foot, No pain with ROM    Assessment/Plan: 1. Functional deficits which require 3+ hours per day of interdisciplinary therapy in a comprehensive inpatient rehab setting.  Physiatrist is providing close team supervision and 24 hour management of active medical problems listed below.  Physiatrist and rehab team continue to assess barriers to discharge/monitor patient progress toward functional and medical goals  Care Tool:  Bathing    Body parts bathed by patient: Right arm,Left arm,Chest,Abdomen,Front perineal area,Face   Body parts bathed by helper: Buttocks,Right upper leg,Left upper leg,Right lower leg,Left lower leg     Bathing assist Assist Level: 2 Helpers     Upper Body Dressing/Undressing Upper body dressing   What is the patient wearing?: Pull over shirt    Upper body assist Assist Level: Minimal Assistance - Patient > 75%    Lower Body Dressing/Undressing Lower body dressing      What is the patient wearing?: Pants     Lower body assist Assist for lower body dressing: Minimal Assistance - Patient > 75%     Toileting Toileting    Toileting assist Assist for toileting: Dependent - Patient  0% (stedy for STS, otherwise min A)     Transfers Chair/bed transfer  Transfers assist  Chair/bed transfer activity did not occur: Safety/medical concerns  Chair/bed transfer assist level: 2 Helpers     Locomotion Ambulation   Ambulation assist   Ambulation activity did not occur: Safety/medical concerns  Assist level: 2 helpers Assistive device: Walker-rolling Max distance: 20'   Walk 10 feet activity   Assist  Walk 10 feet activity did not occur: Safety/medical concerns        Walk 50 feet activity   Assist Walk 50 feet with 2 turns activity did not occur: Safety/medical concerns         Walk 150 feet  activity   Assist Walk 150 feet activity did not occur: Safety/medical concerns         Walk 10 feet on uneven surface  activity   Assist Walk 10 feet on uneven surfaces activity did not occur: Safety/medical concerns         Wheelchair     Assist Will patient use wheelchair at discharge?:  (TBD)             Wheelchair 50 feet with 2 turns activity    Assist            Wheelchair 150 feet activity     Assist          Blood pressure 127/72, pulse 81, temperature 98.4 F (36.9 C), temperature source Oral, resp. rate 18, weight 90.9 kg, SpO2 96 %.  Medical Problem List and Plan: 1. Right side weakness and slurred speech secondary to intracerebral hemorrhage complicated by hydrocephalus. Status post EVD placed per neurosurgery -patient may not shower -ELOS/Goals: 5/26 -con't PT and OT and SLP 2. Acute PE/bilateral lower extremity DVT: Status post IVC filter 10/08/2020 with thrombectomy and retrieval of IVC filter 10/28/2020 per interventional radiology (still has suprarenal filter ) No RLE, scheduled for repeat thrombectomy this week -DVT/anticoagulation: Continue Eliquis- unless this needs to be stopped for IR procedure  5/7- per IR< also needs SCDs at all times in bed- IR went over with nursing. Also, wrote order for R popliteal area to have sutures removed.    5/8- pt keeps removing them per nursing- family to try and keep them in place -antiplatelet therapy: N/A 3. Pain Management: Decrease oxycodone to q12H prn  5/8- pt denies pain- con't regimen 4. Mood: Amantadine 200 mg twice daily, may need to reduce if pt becomes agitated  -antipsychotic agents: N/A 5. Neuropsych: This patient is not capable of making decisions on his own behalf. 6. Skin/Wound Care: Routine skin checks 7. Fluids/Electrolytes/Nutrition: Routine in and outs with follow-up chemistries 8. PAF. Status post  ablation cardioversion in the Past. Continue Eliquis. Cardio exam 30 mg every 6 hours. Cardiac rate becoming Huston Foley will reduce diltiazem to 30mg   TID 9. Acute hypoxic respiratory failure. Tracheostomy 10/04/2020 per DrChand. Presently with a #6 Shiley. PMV as tolerated. Follow-up speech therapy. Daily weights. 10. Hospital-acquired pneumonia/ventilator associated pneumonia. Infectious disease follow-up antibiotic therapy completed  5/7- a few rhonchi on exam- per pt, no SOB- has no complaints about breathing issues.  5/8- sounds good- cn't regimen  11. Dysphagia. Dysphagia #3 thin liquids.  intake good will d/c Cortrak Feeding tube 12. Hyperglycemia related to tube feeds. Lantus insulin 6 units daily. Continue SSI. Hemoglobin A1c was 5.7   CBG (last 3)  Recent Labs    11/06/20 1633 11/06/20 1958 11/07/20 0431  GLUCAP 127* 113* 109*  will d/c  CBG not diabetic will be off TF- CBGs nl- will d/c  13.  RIght foot pain ? Tibial nerve irritation post procedure circulation is good, no MSK exam abnl 14. Insomnia  5/7- will add trazodone for sleep- 50 mg QHS  5/8- increase to 100 mg QHS 5/10 slept better off TF   LOS: 10 days A FACE TO FACE EVALUATION WAS PERFORMED  Erick Colace 11/07/2020, 7:40 AM

## 2020-11-07 NOTE — Progress Notes (Signed)
Blood pressure 127/72, pulse 81, temperature 98.4 F (36.9 C), temperature source Oral, resp. rate 18, weight 90.9 kg, SpO2 96 %.   Patient had several episodes of transient bradycardia when asleep (HR 38-41, irregular rate) usually after getting Diltiazem syrup PO but resolves (HR 70s-103) when awake.  On call Dr informed and ordered EKG when an episode happens again.

## 2020-11-07 NOTE — Progress Notes (Signed)
Speech Language Pathology Daily Session Note  Patient Details  Name: HADRIAN YARBROUGH MRN: [id number] Date of Birth: 12/27/1955  Today's Date: 11/07/2020 SLP Individual Time: 0815-0855 SLP Individual Time Calculation (min): 40 min  Short Term Goals: Week 2: SLP Short Term Goal 1 (Week 2): Patient will utilize an increased vocal intensity at the phrase leve to achieve ~75% intelligibility with Mod verbal cues. SLP Short Term Goal 2 (Week 2): Patient will consume current diet with minimal overt s/s of aspiration with Mod verbal cues for use of swallowing compensatory strategies. SLP Short Term Goal 3 (Week 2): Patient will demonstrate sustained attention to functional tasks for 30 minutes with Mod verbal cues for redirection. SLP Short Term Goal 4 (Week 2): Patient will utilize external aids and contextual cues for orientation with Min multimodal cues. SLP Short Term Goal 5 (Week 2): Patient will demonstrate functional problem solving for basic and familiar tasks with Mod A multimodal cues.  Skilled Therapeutic Interventions: Skilled treatment session focused on cognitive goals. Upon arrival, patient was awake in bed. Patient's trach was capped and patient demonstrated increased vocal intensity and overall speech intelligibility. Patient participated in a basic to mildly complex medication management tasks with overall supervision level verbal cues and extra time. Patient appeared more alert today with increased attention to tasks. Patient left upright in bed with alarm on and all needs within reach. Continue with current plan of care.      Pain No/Denies Pain    Therapy/Group: Individual Therapy  Toyna Erisman 11/07/2020, 12:10 PM

## 2020-11-07 NOTE — Progress Notes (Signed)
Physical Therapy Weekly Progress Note  Patient Details  Name: Walter [name] MRN: [id number] Date of Birth: January 14, 1956  Beginning of progress report period: Oct 29, 2020 End of progress report period: Nov 06, 2020  Today's Date: 11/07/2020 PT Individual Time:  1300-1400 OT Individual Time Calculation (min): 60 min  Patient has met 5 of 5 short term goals.  Pt is demonstrating improvements in strength, ability to follow instructions, coordination, awareness, and sitting balance/ tolerance. Pt has progressed with nutritional needs and NG tube has been removed. Pt doing well with speaking valve and has had trach capped by RT this day.   Patient continues to demonstrate the following deficits muscle weakness, decreased cardiorespiratoy endurance, impaired timing and sequencing, unbalanced muscle activation, motor apraxia, ataxia, decreased coordination and decreased motor planning, decreased midline orientation, decreased attention to left and decreased motor planning, decreased awareness, decreased problem solving, decreased safety awareness, decreased memory and delayed processing and decreased standing balance, hemiplegia and decreased balance strategies and therefore will continue to benefit from skilled PT intervention to increase functional independence with mobility.  Patient progressing toward long term goals..  Continue plan of care.  PT Short Term Goals Week 2:  PT Short Term Goal 1 (Week 2): Pt will consistently perform supine<>sit with supervision. PT Short Term Goal 2 (Week 2): Pt will perform sit<>stands using LRAD with CGA. PT Short Term Goal 3 (Week 2): Pt will perform SPVT transfers using LRAD with Min A. PT Short Term Goal 4 (Week 2): Pt will ambulate at least 72f using LRAD with +2 Min A with wide BOS.  Skilled Therapeutic Interventions/Progress Updates:  Patient supine in bed on entrance to room. Patient alert and agreeable to PT session. Patient denied pain during  session.  Pt has had Cortrak ng tube removed this morning and also has had trach capped by RT. Voice has increased volume this day.   Therapeutic Activity: Bed Mobility: Patient performed sit--> supine at end of session with CGA/ Min A for BLE. Alo requires add'l time to complete d/t delay in motor planning.  VC/ tc required for technique and final bed positiong. Participates in move toward [facility] headboard with Min A +2. Transfers: Patient performed STS transfers with vc and instructions prior to performing for improved impulsive movements and ataxic movements. STS completed with L hand on EVA walker and/ or RW with push from armrest on w/c with RUE. VC also req'd for pt to reach back to w/c armrest with RUE for improved technique. SPVT transfers w/c --> bed with use of bedrail to LUE and Min A for technique.   Gait Training:  Patient ambulated 120' x2 using EVA walker with heavy Mod/ Max A for maintaining balance, maintaining speed.. Demonstrated hip width BOS during stepping with cognitive choice to bring feet wider than what feels normal. Provided vc/ tc for maintaining wider BOS, upright posture, walker mgmt.  Also attempted ambulation using bariwalker with pt requiring Max A +2 for managing walker as well as managing step length/ width, forward push into walker and managing walker/ gait speed.   Neuromuscular Re-ed: NMR facilitated during session with focus on standing tolerance/ balance. Pt guided in standing activity requiring obtaining horseshoes with L hand, passing to R hand and hanging horseshoe on basketball hoop. Pt requires Mod to Max A for grasping with L hand. Pt also impulsively standing with no UE support to complete despite vc/ tc for maintaining one UE support to RW - requires Max A +2  to maintain balance. Regressed to LUE to RW and collecting horseshoes from hoop with RUE only. Requires vc for motor control and plan with intermittent min A to [employer]. Final  standing bout with RUE use to hang horseshoes to hoop and completes with improved RUE movement as well as requiring light Mod A to maintain balance with +2 for safety throughout task. NMR performed for improvements in motor control and coordination, balance, sequencing, judgement, and self confidence/ efficacy in performing all aspects of mobility at highest level of independence.   Patient supine in bed at end of session with brakes locked, bed alarm set, and all needs within reach.  Therapy Documentation Precautions:  Precautions Precautions: Fall Precaution Comments: NG tube, trach, PMSV, R hemiparesis, hx of DVTs and PEs Other Brace: BUE resting hand splints, see schedule Restrictions Weight Bearing Restrictions: No Vital Signs: Therapy Vitals Temp: 99.1 F (37.3 C) Temp Source: Oral Pulse Rate: 83 Resp: 20 BP: 119/69 Oxygen Therapy SpO2: 97 % O2 Device: Room Air  Therapy/Group: Individual Therapy  Alger Simons 11/06/2020, 12:11 PM

## 2020-11-07 NOTE — Progress Notes (Signed)
Physical Therapy Session Note  Patient Details  Name: Walter [name] MRN: [id number] Date of Birth: Nov 06, 1955  Today's Date: 11/07/2020 PT Individual Time: 1102-1204 PT Individual Time Calculation (min): 62 min   Short Term Goals: Week 2:  PT Short Term Goal 1 (Week 2): Pt will consistently perform supine<>sit with supervision. PT Short Term Goal 2 (Week 2): Pt will perform sit<>stands using LRAD with CGA. PT Short Term Goal 3 (Week 2): Pt will perform SPVT transfers using LRAD with Min A. PT Short Term Goal 4 (Week 2): Pt will ambulate at least 39ft using LRAD with +2 Min A with wide BOS.  Skilled Therapeutic Interventions/Progress Updates:  Patient supine in bed on entrance to room. Patient initially asleep but easily roused and agreeable to PT session. Patient denied pain throughout session. +2 available throughout session for safety and balance as well as assist in managing EVA walker and following with w/c during ambulation.  Therapeutic Activity: Bed Mobility: Patient performed supine <> sit with close supervision for safety. VC/ tc required during transition to sit for improved trunk stability and maintaining upright seated position.  Transfers: Patient performed STS transfers w/c <> EVA walker with Min A for safety and balance. SPVT transfers require Min A with prior explanation of steps to be performed, otherwise continues to require Mod A for impulsive initiation, balance, improved trunk control, and safety. Using bedrail for transfer w/c --> bed and safety rail for transfer to toilet.  Provided verbal cues for sequence of hand positioning and technique. Pt impulsive in transfer from toilet back to w/c and req's Mod/ Max A for safe completion to sit in w/c. Follows instructions well for anterior weight shifting to improve sitting posture throughout all sessions.   Gait Training:  Patient ambulated 135' x1 feet using EVA walker with Mod/ Max A +2 for managing speed and path of EVA  walker and w/c follow. Demonstrated intermittent narrow stepping pattern but able to correct with vc throughout. Pt also takes longer steps with RLE requiring vc to correct and pt demos change in step length. Provided vc/ tc throughout for upright posture, walker mgmt and improved proximity to walker,   Neuromuscular Re-ed: NMR facilitated during session with focus on standing balance and motor control during STS. Use of mirror for improved proprioception of posture. Pt guided in rise to stand with attempt to perform with slow movements to initiate increased control of BLE and trunk, however pt continues to push from w/c bilaterally and quickly raise UB to reach BUE to handrests on either EVA walker or RW. Attempt to rise to stand with no UE and pt's weight is thrown posteriorly and demos hard sit. Improvements noted however in descent to sit with pt able to cross arms and lower to sit with decreased speed. Performed x4 with noted improvement in ataxic trunk sway. NMR performed for improvements in motor control and coordination, balance, sequencing, judgement, and self confidence/ efficacy in performing all aspects of mobility at highest level of independence.   Patient sidelying in bed at end of session with brakes locked, bed alarm set, Hob elevated to 20deg and all needs within reach. RN and NT notified as to pt's position in bed.  Session 2:   Patient supine in bed on entrance to room with dtr and friend present. Patient alert and agreeable to PT session. Guests state it is time for them to leave and dtr notes to PT that pt appears to be improving overall in just one week  with progress noted in BUE strength and control/ coordination, breathing, and voice/ volume production. Patient denied pain throughout session. +2 available throughout session for safety and balance.   Therapeutic Activity: Bed Mobility: Patient performed sit --> sidelying L with supervision. VC/ tc required for improving  positioning in bed with pt able to make adjustments. Transfers: Patient performed STS this session with CGA and vc prior to transfer and then good control with performance. SPVT transfers mostly Min A with 2 instances of Mod A for balance and impulsive pivot stepping and descent to sit. Provided vc/ tc for improving technique and slowing performance in order to improve motor control.   At end of session, pt returned to room and states need to toilet. Transfer to toilet to pt's R side in order to utilize safety rail with verbal instructions for safe transfer. Performed with Min A but pt sitting prior to doffing pants. Pt doffs pants while sitting with overall Min A for leaning forward in order to unweight from seat and pull down pants. On rising to stand, pt standing with no UE support and is able to don pants by himself with BUE while PT provides Min A for standing balance support.   Neuromuscular Re-ed: NMR facilitated during session with focus on trunk control and BUE motor planning and control. Pt guided in seated modified abdominal crunches using theraball behind pt and allowing pt to recline back into ball, inhale, then exhale while rising to upright seated position. Ball progressed away from pt for increased recline until pt requires Mod A to return to upright sitting and noted to hold breath for effort. Pt also guided in sitting reach activity with several colored discs with varied numbers arranged on tray table 3-4 feet in front of pt. Pt guided in reach with call for desired hand to desired color and number. 95% accuracy on choosing correct disc. Zeroing in with pinch grasp required extra time and attempts with L UE more than RUE. Pt able to correctly hand disc to PT and to tech on other side of pt as well as reach around and place disc on mat behind pt with each hand. Reaches outside of BOS, across midline and to varying heights from mid shin to head height. NMR performed for improvements in motor  control and coordination, balance, sequencing, judgement, and self confidence/ efficacy in performing all aspects of mobility at highest level of independence.   Patient sidelying in bed at end of session with brakes locked, bed alarm set, and all needs within reach. Rn notified as to pt's position.   Therapy Documentation Precautions:  Precautions Precautions: Fall Precaution Comments: NG tube, trach, PMSV, R hemiparesis, hx of DVTs and PEs Other Brace: BUE resting hand splints, see schedule Restrictions Weight Bearing Restrictions: No Vital Signs: Therapy Vitals Pulse Rate: 85 Resp: 18 Patient Position (if appropriate): Sitting Oxygen Therapy SpO2: 97 % O2 Device: Room Air  Therapy/Group: Individual Therapy  Loel Dubonnet PT, DPT 11/07/2020, 12:23 PM

## 2020-11-07 NOTE — Progress Notes (Signed)
Occupational Therapy Weekly Progress Note  Patient Details  Name: Walter [name] MRN: [id number] Date of Birth: [birth year]  Beginning of progress report period: Oct 29, 2020 End of progress report period: Nov 07, 2020   Patient has met 2 of 3 short term goals.  Pt has made great progress this reporting period with OT. Pt has demonstrated improved activity tolerance, motor planning, gross BUE/BLE coordination, and postural control to complete UBD/LBD with min to mod A, toilet transfers with assist of A + stedy, and seated grooming with min to mod A. Pt cont to primarily limited by diplopia, decreased postural control, and ataxic movements, but is highly motivated to participate in therapy.  Patient continues to demonstrate the following deficits: muscle weakness, decreased cardiorespiratoy endurance and decreased oxygen support, impaired timing and sequencing, unbalanced muscle activation, motor apraxia, ataxia, decreased coordination and decreased motor planning, diplopia, decreased midline orientation and decreased motor planning, decreased attention, decreased awareness, decreased problem solving and decreased safety awareness and decreased sitting balance, decreased standing balance, decreased postural control and hemiplegia and therefore will continue to benefit from skilled OT intervention to enhance overall performance with BADL and Reduce care partner burden.  Patient progressing toward long term goals..  Continue plan of care.  OT Short Term Goals Week 2:  OT Short Term Goal 1 (Week 2): Pt will complete STS in prep for standing ADL with mod A of 1. OT Short Term Goal 2 (Week 2): Pt will complete seated grooming with min A for task completion. OT Short Term Goal 3 (Week 2): Pt will complete BSC or toilet transfer with 1 assist and [place] MS, OTR/L  11/07/2020, 12:31 PM

## 2020-11-07 NOTE — Progress Notes (Signed)
Occupational Therapy Session Note  Patient Details  Name: Walter [name] MRN: [id number] Date of Birth: 1955/07/22  Today's Date: 11/07/2020 OT Individual Time: 1194-1740 OT Individual Time Calculation (min): 54 min    Short Term Goals: Week 2:  OT Short Term Goal 1 (Week 2): Pt will complete STS in prep for standing ADL with mod A of 1. OT Short Term Goal 2 (Week 2): Pt will complete seated grooming with min A for task completion. OT Short Term Goal 3 (Week 2): Pt will complete BSC or toilet transfer with 1 assist and LRAD  Skilled Therapeutic Interventions/Progress Updates:    Treatment session with focus on self-care retraining, functional transfers, and dynamic standing balance, and functional use of LUE.  Pt received supine in bed reporting desire to wash hair.  Pt then wavering on whether he wanted to complete bathing this session or not.  Pt ultimately agreeable to bathing at sink.  Pt completed bed mobility with min assist to come to sitting EOB.  Completed squat pivot transfer to R with min assist +2 for safety due to impulsivity.  Engaged in LB bathing at sit > stand level with +2 for safety but pt able to complete sit > stand with min assist this session.  Pt able to adjust shorts up and down when doffing dirty shorts and donning clean shorts.  Pt washed hair with hand over hand assist to incorporate LUE into washing and then managing hair dryer in L hand.  Pt doffed shirt with min assist and completed UB bathing with increased time due to LUE ataxia.  Pt donned shirt with min assist.  Pt reporting fatigue and requesting to return to bed.  Pt completed squat pivot transfer to L with mod assist and +2 for safety, pt demonstrating decreased weight shift to L during transfer.  Pt positioned semi-reclined in bed with all needs in reach.  Daughter arriving at end of session.  O2 sats remained >98% with trach capped.  Therapy Documentation Precautions:  Precautions Precautions:  Fall Precaution Comments: NG tube, trach, PMSV, R hemiparesis, hx of DVTs and PEs Other Brace: BUE resting hand splints, see schedule Restrictions Weight Bearing Restrictions: No General:   Vital Signs: Therapy Vitals Temp: 98.5 F (36.9 C) Temp Source: Oral Pulse Rate: 83 Resp: 17 BP: 138/81 Patient Position (if appropriate): Lying Oxygen Therapy SpO2: 99 % O2 Device: Room Air Pain:  Pt with no c/o pain   Therapy/Group: Individual Therapy  Rosalio Loud 11/07/2020, 3:17 PM

## 2020-11-08 LAB — GLUCOSE, CAPILLARY: Glucose-Capillary: 122 mg/dL — ABNORMAL HIGH (ref 70–99)

## 2020-11-08 MED ORDER — HYDROCORTISONE 1 % EX CREA
TOPICAL_CREAM | Freq: Two times a day (BID) | CUTANEOUS | Status: DC | PRN
  Filled 2020-11-08: qty 28

## 2020-11-08 NOTE — Progress Notes (Signed)
Physical Therapy Session Note  Patient Details  Name: Walter [name] MRN: [id number] Date of Birth: 05-13-1956  Today's Date: 11/08/2020 PT Individual Time: 0814-4818 PT Individual Time Calculation (min): 70 min   Short Term Goals: Week 2:  PT Short Term Goal 1 (Week 2): Pt will consistently perform supine<>sit with supervision. PT Short Term Goal 2 (Week 2): Pt will perform sit<>stands using LRAD with CGA. PT Short Term Goal 3 (Week 2): Pt will perform SPVT transfers using LRAD with Min A. PT Short Term Goal 4 (Week 2): Pt will ambulate at least 66f using LRAD with +2 Min A with wide BOS.  Skilled Therapeutic Interventions/Progress Updates:      Pt received sitting in WC and agreeable to PT. Pt transported to day room.   Visual scanning and coordination training with BITS. Performed sitting x 2 bouts with use of RUE and then LUE with min assist for coordination isolation of target due to diplopia. Standing balance at bits with UE support on RW with min assist for balance and min-mod assist for coordination for use of RUE and LUE. Hand support used with LUE on RW due to sensory deficits.   Standing balance/standing tolerance with wii bowling. Pt able to maintain balance x 4 frames with min-mod assist from PT and max cues for technique and problem solving to use Wii remote.   Gait training with RW 2 x 469fwith mod assist from PT and +2 for safety and AD management. Max cues for step width and decreased step length on the LLE as well as use of Hand splint for safety.   Pt returned to room and performed stand pivot transfer to bed with RW and mod assist. Sit>supine completed with supervision assist, and left supine in bed with call bell in reach and all needs met.    Therapy Documentation Precautions:  Precautions Precautions: Fall Precaution Comments: NG tube, trach, PMSV, R hemiparesis, hx of DVTs and PEs Other Brace: BUE resting hand splints, see schedule Restrictions Weight  Bearing Restrictions: No    Vital Signs: Therapy Vitals Temp: 98.3 F (36.8 C) Temp Source: Oral Pulse Rate: 93 Resp: 18 BP: 134/86 Patient Position (if appropriate): Sitting Oxygen Therapy SpO2: 100 % O2 Device: Room Air Pain: denies   Therapy/Group: Individual Therapy  AuLorie Phenix/05/2021, 5:31 PM

## 2020-11-08 NOTE — Progress Notes (Addendum)
Overnight patient heart rate noted to fluctuate frequently between 40s-100s. BP and o2 sats stable. Patient with no signs/symptoms of distress. MD Kirstiens notified. AM dose of cardizem given.

## 2020-11-08 NOTE — Progress Notes (Signed)
Patient ID: Walter [name], male   DOB: 05-06-1956, 65 y.o.   MRN: [id number] Team Conference Report to Patient/Family  Team Conference discussion was reviewed with the patient and caregiver, including goals, any changes in plan of care and target discharge date.  Patient and caregiver express understanding and are in agreement.  The patient has a target discharge date of 11/23/20.   SW met with pt and spouse in room. Pt and spouse happy with pt progress. Pt currently asking to use bathroom, waiting on nursing assistance. SW will cont to follow up   Erlene Quan, [place] 11/08/2020, 1:09 PM

## 2020-11-08 NOTE — Patient Care Conference (Signed)
Inpatient RehabilitationTeam Conference and Plan of Care Update Date: 11/08/2020   Time: 10:04 AM    Patient Name: Walter [name]      Medical Record Number: [id number]  Date of Birth: [birth year] Sex: Male         Room/Bed: 4M05C/4M05C-01 Payor Info: Payor: MEDICARE / Plan: MEDICARE PART A AND B / Product Type: *No Product type* /    Admit Date/Time:  10/28/2020  5:33 PM  Primary Diagnosis:  ICH (intracerebral hemorrhage) [facility])  Hospital Problems: Principal Problem:   ICH (intracerebral hemorrhage) (HCC) Active Problems:   Tracheostomy care [facility])   Malnutrition of moderate degree    Expected Discharge Date: Expected Discharge Date: 11/23/20  Team Members Present: Physician leading conference: Dr. Claudette Laws Care Coodinator Present: Chana Bode, RN, BSN, CRRN;Christina Worthing, BSW Nurse Present: Chana Bode, RN PT Present: Grier Rocher, PT OT Present: Other (comment) Annye English, OT) SLP Present: Other (comment) Fae Pippin, SLP) PPS Coordinator present : Fae Pippin, SLP     Current Status/Progress Goal Weekly Team Focus  Bowel/Bladder   Continent Bowels LBM 5/10. Incontinent of urine at times  Be continent of Bowel and bladder  Try times toileting during the day q2-3hr   Swallow/Nutrition/ Hydration   Dys.2 textures with thin liquids, Mod A  Supervision  tolerance of least restrictive PO, carryover and use if strategies, trails of upgraded textures as pacing/impulsivity improves   ADL's   min A UBD/seated grooming, min to mod A LBD, toileting tasks, cont to use stedy for toilet transfers, cont to have significant diplopia began nasal occlusion taping of glasses  S to min A (toileting, bathing)  NMR, static/dynamic sitting balance, functional transfers, activity tolerance, midline orientation   Mobility   Bed mobility = supervision; transfers Min A when impulsiveness is controlled otherwise can need up to Mod/ Max for coordination/ balance/ motor  control; Ambulation = Mod/ Max A +2 for controlling speed, managing EVA walker reaching 135 feet with improving step placement. No w/c training yet  bed mobility = supervision; transfers = CGA/ Min A; gait = Min A with LRAD for household distances  Continued: NMR for improved motor control/ planning and reducing impulsivity, standing balance, improving activity tolerance   Communication   #6 cuffless trach capping trials, continued PMSV trials, Mod A for vocal intensity and overall intelligibility  Supervision  continued use of increased vocal intensity to maxamize intelligiblity   Safety/Cognition/ Behavioral Observations  Min A  Supervision  continue to address recall, attention, and awareness   Pain   Denies pain  less than 3 out of 10  Assess q shift and PRN   Skin   site to rt posterior knee pink OTA, Lt posterior head stage 1?- pink-appears blanchable soft OTA  no new skin breakdown  assess skin q shift and PRN     Discharge Planning:  Pt to d/c home with spouse. Avaliable 24/7 2 level home, 5 steps to enter (able to maintain on main level)   Team Discussion: Amantadine wean for discharge, cortrak out and on oral diet. Diet downgraded due to impulsiveness. Trach capped with PMSV speaking training. Good progress overall. Progress limited by ataxia left lower extremity and LLE impulsiveness.  Patient on target to meet rehab goals: yes, currently min - mod assist for lower body care and using stedy for toilet transfers. Able to complete stand pivot transfer with min - mod assist and ambulated 150' with mod assist and cues for left lower extremity awareness. Supervision -  min assist goals set for discharge  *See Care Plan and progress notes for long and short-term goals.   Revisions to Treatment Plan:  Working on PMSV/cognition training Downgraded diet due to impulsiveness  Teaching Needs: Transfers, toileting, secondary stroke risk management, medications, etc.   Current Barriers to  Discharge: Decreased caregiver support and Trach  Possible Resolutions to Barriers: Family education with wife Trach capped; hopeful to decannulate for discharge     Medical Summary Current Status: Core track removed, trach capped possible discontinuation this week, heart rates variable, diplopia improving  Barriers to Discharge: Medical stability;Trach   Possible Resolutions to [employer]: Continue trach weaning, continue full therapy program, adjustment of diltiazem   Continued Need for Acute Rehabilitation Level of Care: The patient requires daily medical management by a physician with specialized training in physical medicine and rehabilitation for the following reasons: Direction of a multidisciplinary physical rehabilitation program to maximize functional independence : Yes Medical management of patient stability for increased activity during participation in an intensive rehabilitation regime.: Yes Analysis of laboratory values and/or radiology reports with any subsequent need for medication adjustment and/or medical intervention. : Yes   I attest that I was present, lead the team conference, and concur with the assessment and plan of the team.   Chana Bode B 11/08/2020, 12:57 PM

## 2020-11-08 NOTE — Progress Notes (Signed)
Physical Therapy Session Note  Patient Details  Name: Walter [name] MRN: [id number] Date of Birth: 1956-03-14  Today's Date: 11/08/2020 PT Co-Treatment Time: 0830-0900 PT Co-Treatment Time Calculation (min): 30 min  Short Term Goals:  Week 2:  PT Short Term Goal 1 (Week 2): Pt will consistently perform supine<>sit with supervision. PT Short Term Goal 2 (Week 2): Pt will perform sit<>stands using LRAD with CGA. PT Short Term Goal 3 (Week 2): Pt will perform SPVT transfers using LRAD with Min A. PT Short Term Goal 4 (Week 2): Pt will ambulate at least 5f using LRAD with +2 Min A with wide BOS. Week 3:     Skilled Therapeutic Interventions/Progress Updates:  Pt received supine in bed and agreeable to PT/OT co-treat. Supine>sit transfer with supervision assist with cues for use of bed rial. PT/OT assisted with upper body dressing with min assist for balance sitting EOB and mod assist for clothing management for safety of track management.   [facility] with min assist while in stedy. Min-mod cues for safety to reduce fall risk due to impulsivity and pt taking BUE off support rail to attempt clothing management. Pt able to void bowels on toilet. Lower body dressing once BM completed with min assist for sitting balance to prevent forward LOB with reach to feet. And mod assist for management of the RLE.   [facility]  Gait training with EHarmon Pierwalker and min-mod assist +2 for safety to manage EHarmon Pierwalker speed and provide moderate cues for decreased step length, increased step width and improved control x 1455f  Forward/reverse gait training with EvHarmon Pieralker and min-mod assist +2 for safety.   Seated balance/deep core control and activation to perform lateral/cross body reaches while sitting in airex pad.   [facility] returned to [facility] and [facility] reach and all needs met.         Therapy Documentation Precautions:   Precautions Precautions: Fall Precaution Comments: NG tube, trach, PMSV, R hemiparesis, hx of DVTs and PEs Other Brace: BUE resting hand splints, see schedule Restrictions Weight Bearing Restrictions: No    Vital Signs: Therapy Vitals Temp: 97.7 F (36.5 C) Pulse Rate: 85 Resp: 18 BP: 126/84 Patient Position (if appropriate): Lying Oxygen Therapy SpO2: 97 % O2 Device: Room Air Pain: denies   Therapy/Group: Individual Therapy  AuLorie Phenix/05/2021, 9:01 AM

## 2020-11-08 NOTE — Progress Notes (Signed)
Occupational Therapy Session Note  Patient Details  Name: Walter [name] MRN: [id number] Date of Birth: 12-25-1955  Today's Date: 11/08/2020 OT Co-Treatment Time: 0940-7680 OT Co-Treatment Time Calculation (min): 26 min   Short Term Goals: Week 2:  OT Short Term Goal 1 (Week 2): Pt will complete STS in prep for standing ADL with mod A of 1. OT Short Term Goal 2 (Week 2): Pt will complete seated grooming with min A for task completion. OT Short Term Goal 3 (Week 2): Pt will complete BSC or toilet transfer with 1 assist and LRAD  Skilled Therapeutic Interventions/Progress Updates:    Pt received semi--reclined in bed, denies pain, agreeable to therapy. Session focus on co-treat with PT re functional mobility, dressing, toileting, and dynamic sitting balance. Came to sitting EOB with min A to progress BLE off bed. Intermittant min A to maintain static sitting balance. Doffed/donned shirt with min A to manage clothing around trach collar. Stedy transfer to bathroom 2/2 urgency, pt able to assist with LB clothing management and seated pericare. Cont void of bladder. Donned pants with min A + 2 for balance during STS. TIS transport to and from gym. Amb in 3 bouts forwards/backwords with mod A + 2 for balance with use of Eva walker. Stand-pivot to and from mat with min A + 2 for safety. Seated on balance pad, used BUE to retrieve and place cards from knee/head height. Req light hand-over-hand assist to accurately place card 2/2 ataxic movements, but overall demonstrated great improvement in BUE coordination and in functional grasp.   Pt left in TIS awaiting SLP, chair alarm engaged, call bell in reach, and all immediate needs met.    Therapy Documentation Precautions:  Precautions Precautions: Fall Precaution Comments: NG tube, trach, PMSV, R hemiparesis, hx of DVTs and PEs Other Brace: BUE resting hand splints, see schedule Restrictions Weight Bearing Restrictions: No Pain:   denies ADL:  See Care Tool for more details.   Therapy/Group: Co-Treatment  [name] MS, OTR/L  11/08/2020, 6:36 AM

## 2020-11-08 NOTE — Progress Notes (Signed)
Speech Language Pathology Daily Session Note  Patient Details  Name: Walter [name] MRN: [id number] Date of Birth: [birth year]  Today's Date: 11/08/2020 SLP Individual Time: 9798-9211 SLP Individual Time Calculation (min): 40 min  Short Term Goals: Week 2: SLP Short Term Goal 1 (Week 2): Patient will utilize an increased vocal intensity at the phrase leve to achieve ~75% intelligibility with Mod verbal cues. SLP Short Term Goal 2 (Week 2): Patient will consume current diet with minimal overt s/s of aspiration with Mod verbal cues for use of swallowing compensatory strategies. SLP Short Term Goal 3 (Week 2): Patient will demonstrate sustained attention to functional tasks for 30 minutes with Mod verbal cues for redirection. SLP Short Term Goal 4 (Week 2): Patient will utilize external aids and contextual cues for orientation with Min multimodal cues. SLP Short Term Goal 5 (Week 2): Patient will demonstrate functional problem solving for basic and familiar tasks with Mod A multimodal cues.  Skilled Therapeutic Interventions:   Patient seen for skilled ST session focusing on cognitive goals. Patient was alert, sitting in [facility] when SLP entered the room. His wife was present for very end of session. Patient demonstrated ability to complete basic level mental math calculations during task of calculating amounts and tips of simulated restaurant bills. He utilized calculator for more involved computations and although he did have frequent errors secondary to pushing incorrect button, he was aware of errors approximately 60% of the time without cues.[name] He did require more frequent cues (from minA to [facility]) as session progressed and visual deficits appeared to contribute as well. He is able to recall some recent events but is not accurate in regards to time frame, such as telling SLP he has had trach capped for 4 days and they were going to take it out after the 5th day. Patient continues to benefit from skilled  SLP intervention to maximize cognitive-linguistic, speech and swallow function goals prior to discharge.  Pain Pain Assessment Pain Scale: 0-10 Pain Score: 0-No pain  Therapy/Group: Individual Therapy  Angela Nevin, MA, CCC-SLP Speech Therapy

## 2020-11-08 NOTE — Progress Notes (Addendum)
PROGRESS NOTE   Subjective/Complaints:  Remained capped all night x 2 nights, no breathing issues , continenet bowel and bladder  ROS-   Pt denies SOB, abd pain, CP, N/V/C/D, and vision changes- double visition improving   Objective:   No results found. No results for input(s): WBC, HGB, HCT, PLT in the last 72 hours. Recent Labs    11/06/20 0510  NA 134*  K 4.0  CL 101  CO2 26  GLUCOSE 123*  BUN 7*  CREATININE 0.70  CALCIUM 9.0    Intake/Output Summary (Last 24 hours) at 11/08/2020 7672 Last data filed at 11/08/2020 0802 Gross per 24 hour  Intake 545 ml  Output 300 ml  Net 245 ml     Pressure Injury 10/21/20 Head Left;Posterior Stage 1 -  Intact skin with non-blanchable redness of a localized area usually over a bony prominence. Pink non-blanchable scalp with thinning hair on posterior left side of head (Active)  10/21/20 1100  Location: Head  Location Orientation: Left;Posterior  Staging: Stage 1 -  Intact skin with non-blanchable redness of a localized area usually over a bony prominence.  Wound Description (Comments): Pink non-blanchable scalp with thinning hair on posterior left side of head  Present on Admission: No    Physical Exam: Vital Signs Blood pressure 126/84, pulse 85, temperature 97.7 F (36.5 C), resp. rate 18, weight 89.3 kg, SpO2 97 %.  ENT- trach #6 Shiley, capped  Throat is dry   General: No acute distress Mood and affect are appropriate Heart: Regular rate and rhythm no rubs murmurs or extra sounds Lungs: Clear to auscultation, breathing unlabored, no rales or wheezes Abdomen: Positive bowel sounds, soft nontender to palpation, nondistended Extremities: No clubbing, cyanosis, or edema Skin: No evidence of breakdown, no evidence of rash  Neurologic: , motor strength is 4/5 in bilateral deltoid, bicep, tricep, grip, hip flexor, knee extensors, ankle dorsiflexor and plantar  flexor Sensory exam normal sensation to light touch and proprioception in bilateral upper and lower extremities Cerebellar exam severe dysmetria Left FNF     Musculoskeletal: Full range of motion in all 4 extremities. No joint swelling, no joint effusion R foot, No pain with ROM    Assessment/Plan: 1. Functional deficits which require 3+ hours per day of interdisciplinary therapy in a comprehensive inpatient rehab setting.  Physiatrist is providing close team supervision and 24 hour management of active medical problems listed below.  Physiatrist and rehab team continue to assess barriers to discharge/monitor patient progress toward functional and medical goals  Care Tool:  Bathing    Body parts bathed by patient: Right arm,Left arm,Chest,Face,Right upper leg,Left upper leg,Abdomen   Body parts bathed by helper: Buttocks,Right upper leg,Left upper leg,Right lower leg,Left lower leg     Bathing assist Assist Level: Minimal Assistance - Patient > 75% (+2 for safety)     Upper Body Dressing/Undressing Upper body dressing   What is the patient wearing?: Pull over shirt    Upper body assist Assist Level: Minimal Assistance - Patient > 75%    Lower Body Dressing/Undressing Lower body dressing      What is the patient wearing?: Pants     Lower body  assist Assist for lower body dressing: Minimal Assistance - Patient > 75%     Toileting Toileting    Toileting assist Assist for toileting: Dependent - Patient 0% (stedy for STS, otherwise min A)     Transfers Chair/bed transfer  Transfers assist  Chair/bed transfer activity did not occur: Safety/medical concerns  Chair/bed transfer assist level: 2 Helpers     Locomotion Ambulation   Ambulation assist   Ambulation activity did not occur: Safety/medical concerns  Assist level: 2 helpers Assistive device: Walker-rolling Max distance: 20'   Walk 10 feet activity   Assist  Walk 10 feet activity did not occur:  Safety/medical concerns        Walk 50 feet activity   Assist Walk 50 feet with 2 turns activity did not occur: Safety/medical concerns         Walk 150 feet activity   Assist Walk 150 feet activity did not occur: Safety/medical concerns         Walk 10 feet on uneven surface  activity   Assist Walk 10 feet on uneven surfaces activity did not occur: Safety/medical concerns         Wheelchair     Assist Will patient use wheelchair at discharge?:  (TBD)             Wheelchair 50 feet with 2 turns activity    Assist            Wheelchair 150 feet activity     Assist          Blood pressure 126/84, pulse 85, temperature 97.7 F (36.5 C), resp. rate 18, weight 89.3 kg, SpO2 97 %.  Medical Problem List and Plan: 1. Right side weakness and slurred speech secondary to intracerebral hemorrhage complicated by hydrocephalus. Status post EVD placed per neurosurgery -patient may not shower -ELOS/Goals: 5/26 -con't PT and OT and SLP Team conference today please see physician documentation under team conference tab, met with team  to discuss problems,progress, and goals. Formulized individual treatment plan based on medical history, underlying problem and comorbidities.\ 2. Acute PE/bilateral lower extremity DVT: Status post IVC filter 10/08/2020 with thrombectomy and retrieval of IVC filter 10/28/2020 per interventional radiology (still has suprarenal filter ) No RLE, scheduled for repeat thrombectomy this week -DVT/anticoagulation: Continue Eliquis-  5/7- per IR< also needs SCDs at all times in bed- IR went over with nursing. -antiplatelet therapy: N/A 3. Pain Management: Decrease oxycodone to q12H prn  5/8- pt denies pain- con't regimen 4. Mood: Amantadine 200 mg twice daily, may need to reduce if pt becomes agitated  -antipsychotic agents: N/A 5. Neuropsych: This patient is not  capable of making decisions on his own behalf. 6. Skin/Wound Care: Routine skin checks 7. Fluids/Electrolytes/Nutrition: Routine in and outs with follow-up chemistries 8. PAF. Status post ablation cardioversion in the Past. Continue Eliquis. Cardio exam 30 mg every 6 hours. Cardiac rate becoming Loletha Grayer will reduce diltiazem to 7m  TID Come increased fluctuation of HR but overall controlled  9. Acute hypoxic respiratory failure. Tracheostomy 10/04/2020 per DrChand. Presently with a #6 Shiley. PMV as tolerated. Follow-up speech therapy. Daily weights. 10. Hospital-acquired pneumonia/ventilator associated pneumonia. Infectious disease follow-up antibiotic therapy completed  11. Dysphagia. Dysphagia #3 thin liquids.good intake 12. Hyperglycemia related to tube feeds. Resolved, off lantus  CBG (last 3)  Recent Labs    11/07/20 0431 11/07/20 1207 11/07/20 1628  GLUCAP 109* 109* 103*  will d/c CBG not diabetic will be off TF- CBGs nl- will  d/c  13.  RIght foot pain ? Tibial nerve irritation post procedure circulation is good, no MSK exam abnl 14. Insomnia  Reduced trazodone after discussion with Phrmacy regarding potential for bradycardia  5/10 slept better off TF   LOS: 11 days A FACE TO [place] [address] [place] Moist 11/08/2020, 8:08 AM

## 2020-11-08 NOTE — Discharge Instructions (Addendum)
Inpatient Rehab Discharge Instructions  Walter [name] Discharge date and time: No discharge date for patient encounter.   Activities/Precautions/ Functional Status: Activity: As tolerated Diet: Regular Wound Care: Routine skin checks Functional status:  ___ No restrictions     ___ Walk up steps independently ___ 24/7 supervision/assistance   ___ Walk up steps with assistance ___ Intermittent supervision/assistance  ___ Bathe/dress independently ___ Walk with walker     _x__ Bathe/dress with assistance ___ Walk Independently    ___ Shower independently ___ Walk with assistance    ___ Shower with assistance ___ No alcohol     ___ Return to work/school ________  COMMUNITY REFERRALS UPON DISCHARGE:    Home Health:   PT     OT     ST    RN                    Agency: [facility] Phone: [phone]    Medical Equipment/Items Ordered: Wheelchair, Agricultural consultant, Bedside Commode                                                 Agency/Supplier: [employer]   Special Instructions:  No driving smoking or alcohol  My questions have been answered and I understand these instructions. I will adhere to these goals and the provided educational materials after my discharge from the hospital.  Patient/Caregiver Signature _______________________________ Date __________  Clinician Signature _______________________________________ Date __________  Please bring this form and your medication list with you to all your follow-up doctor's appointments.   ====================================================.. Information on my medicine - ELIQUIS (apixaban)  This medication education was reviewed with me or my healthcare representative as part of my discharge preparation.     Why was Eliquis prescribed for you? Eliquis was prescribed to treat blood clots that may have been found in the veins of your legs (deep vein thrombosis) or in your lungs (pulmonary embolism) and to reduce  the risk of them occurring again.  What do You need to know about Eliquis ? The dose is ONE 5 mg tablet taken TWICE daily.  Eliquis may be taken with or without food.   Try to take the dose about the same time in the morning and in the evening. If you have difficulty swallowing the tablet whole please discuss with your pharmacist how to take the medication safely.  Take Eliquis exactly as prescribed and DO NOT stop taking Eliquis without talking to the doctor who prescribed the medication.  Stopping may increase your risk of developing a new blood clot.  Refill your prescription before you run out.  After discharge, you should have regular check-up appointments with your healthcare provider that is prescribing your Eliquis.    What do you do if you miss a dose? If a dose of ELIQUIS is not taken at the scheduled time, take it as soon as possible on the same day and twice-daily administration should be resumed. The dose should not be doubled to make up for a missed dose.  Important Safety Information A possible side effect of Eliquis is bleeding. You should call your healthcare provider right away if you experience any of the following: ? Bleeding from an injury or your nose that does not stop. ? Unusual colored urine (red or dark brown) or unusual colored stools (red or black). ?  Unusual bruising for unknown reasons. ? A serious fall or if you hit your head (even if there is no bleeding).  Some medicines may interact with Eliquis and might increase your risk of bleeding or clotting while on Eliquis. To help avoid this, consult your healthcare provider or pharmacist prior to using any new prescription or non-prescription medications, including herbals, vitamins, non-steroidal anti-inflammatory drugs (NSAIDs) and supplements.  This website has more information on Eliquis (apixaban): http://www.eliquis.com/eliquis/home  ==========================================  Deep Vein  Thrombosis    Deep vein thrombosis (DVT) is a condition in which a blood clot forms in a deep vein, such as a lower leg, thigh, or arm vein. A clot is blood that has thickened into a gel or solid. This condition is dangerous. It can lead to serious and even life-threatening complications if the clot travels to the lungs and causes a blockage (pulmonary embolism). It can also damage veins in the leg. This can result in leg pain, swelling, discoloration, and sores (post-thrombotic syndrome).  What are the causes? This condition may be caused by:  A slowdown of blood flow.  Damage to a vein.  A condition that causes blood to clot more easily, such as an inherited clotting disorder.   What increases the risk? The following factors may make you more likely to develop this condition: 1. Being overweight. 2. Being older, especially over age 9. 3. Sitting or lying down for more than four hours. 4. Being in the hospital. 5. Lack of physical activity (sedentary lifestyle). 6. Pregnancy, being in childbirth, or having recently given birth. 7. Taking medicines that contain estrogen, such as medicines to prevent pregnancy. 8. Smoking. 9. A history of any of the following: ? Blood clots or a blood clotting disease. ? Peripheral vascular disease. ? Inflammatory bowel disease. ? Cancer. ? Heart disease. ? Genetic conditions that affect how your blood clots, such as Factor V Leiden mutation. ? Neurological diseases that affect your legs (leg paresis). ? A recent injury, such as a car accident. ? Major or lengthy surgery. ? A central line placed inside a large vein.  What are the signs or symptoms? Symptoms of this condition include:  Swelling, pain, or tenderness in an arm or leg.  Warmth, redness, or discoloration in an arm or leg. If the clot is in your leg, symptoms may be more noticeable or worse when you stand or walk. Some people may not develop any symptoms.  How is this  diagnosed? This condition is diagnosed with: 1. A medical history and physical exam. 2. Tests, such as: ? Blood tests. These are done to check how well your blood clots. ? Ultrasound. This is done to check for clots. ? Venogram. For this test, contrast dye is injected into a vein and X-rays are taken to check for any clots  How is this treated? Treatment for this condition depends on:  The cause of your DVT.  Your risk for bleeding or developing more clots.  Any other medical conditions that you have. Treatment may include: 1. Taking a blood thinner (anticoagulant). This type of medicine prevents clots from forming. It may be taken by mouth, injected under the skin, or injected through an IV (catheter). 2. Injecting clot-dissolving medicines into the affected vein (catheter-directed thrombolysis). 3. Having surgery. Surgery may be done to: ? Remove the clot. ? Place a filter in a large vein to catch blood clots before they reach the lungs. Some treatments may be continued for up to six months.  Follow these instructions at home: If you are taking blood thinners: 1. Take the medicine exactly as told by your health care provider. Some blood thinners need to be taken at the same time every day. Do not skip a dose. 2. Talk with your health care provider before you take any medicines that contain aspirin or NSAIDs. These medicines increase your risk for dangerous bleeding. 3. Ask your health care provider about foods and drugs that could change the way the medicine works (may interact). Avoid those things if your health care provider tells you to do so. 4. Blood thinners can cause easy bruising and may make it difficult to stop bleeding. Because of this: ? Be very careful when using knives, scissors, or other sharp objects. ? Use an electric razor instead of a blade. ? Avoid activities that could cause injury or bruising, and follow instructions about how to prevent falls. 5. Wear a  medical alert bracelet or carry a card that lists what medicines you take.  General instructions  Take over-the-counter and prescription medicines only as told by your health care provider.  Return to your normal activities as told by your health care provider. Ask your health care provider what activities are safe for you.  Wear compression stockings if recommended by your health care provider.  Keep all follow-up visits as told by your health care provider. This is important.  How is this prevented? To lower your risk of developing this condition again: 1. For 30 or more minutes every day, do an activity that: ? Involves moving your arms and legs. ? Increases your heart rate. 2. When traveling for longer than four hours: ? Exercise your arms and legs every hour. ? Drink plenty of water. ? Avoid drinking alcohol. 3. Avoid sitting or lying for a long time without moving your legs. 4. If you have surgery or you are hospitalized, ask about ways to prevent blood clots. These may include taking frequent walks or using anticoagulants. 5. Stay at a healthy weight. 6. If you are a woman who is older than age 65, avoid unnecessary use of medicines that contain estrogen, such as some birth control pills. 7. Do not use any products that contain nicotine or tobacco, such as cigarettes and e-cigarettes. This is especially important if you take estrogen medicines. If you need help quitting, ask your health care provider.  Contact a health care provider if:  You miss a dose of your blood thinner.  Your menstrual period is heavier than usual.  You have unusual bruising.  Get help right away if: 1. You have: ? New or increased pain, swelling, or redness in an arm or leg. ? Numbness or tingling in an arm or leg. ? Shortness of breath. ? Chest pain. ? A rapid or irregular heartbeat. ? A severe headache or confusion. ? A cut that will not stop bleeding. 2. There is blood in your vomit,  stool, or urine. 3. You have a serious fall or accident, or you hit your head. 4. You feel light-headed or dizzy. 5. You cough up blood.  These symptoms may represent a serious problem that is an emergency. Do not wait to see if the symptoms will go away. Get medical help right away. Call your local emergency services (911 in the [place] Do not drive yourself to the hospital. Summary  Deep vein thrombosis (DVT) is a condition in which a blood clot forms in a deep vein, such as a lower leg, thigh, or arm  vein.  Symptoms can include swelling, warmth, pain, and redness in your leg or arm.  This condition may be treated with a blood thinner (anticoagulant medicine), medicine that is injected to dissolve blood clots,compression stockings, or surgery.  If you are prescribed blood thinners, take them exactly as told. This information is not intended to replace advice given to you by your health care provider. Make sure you discuss any questions you have with your health care provider. Document Revised: 05/30/2017 Document Reviewed: 11/15/2016 Elsevier Patient Education  2020 [employer] Inc[employer]   ================================================ Pulmonary Embolism    A pulmonary embolism (PE) is a sudden blockage or decrease of blood flow in one or both lungs. Most blockages come from a blood clot that forms in the vein of a lower leg, thigh, or arm (deep vein thrombosis, DVT) and travels to the lungs. A clot is blood that has thickened into a gel or solid. PE is a dangerous and life-threatening condition that needs to be treated right away.  What are the causes? This condition is usually caused by a blood clot that forms in a vein and moves to the lungs. In rare cases, it may be caused by air, fat, part of a tumor, or other tissue that moves through the veins and into the lungs.  What increases the risk? The following factors may make you more likely to develop this condition: 10. Experiencing a  traumatic injury, such as breaking a hip or leg. 11. Having: ? A spinal cord injury. ? Orthopedic surgery, especially hip or knee replacement. ? Any major surgery. ? A stroke. ? DVT. ? Blood clots or blood clotting disease. ? Long-term (chronic) lung or heart disease. ? Cancer treated with chemotherapy. ? A central venous catheter. 12. Taking medicines that contain estrogen. These include birth control pills and hormone replacement therapy. 13. Being: ? Pregnant. ? In the period of time after your baby is delivered (postpartum). ? Older than age 7. ? Overweight. ? A smoker, especially if you have other risks.  What are the signs or symptoms? Symptoms of this condition usually start suddenly and include:  Shortness of breath during activity or at rest.  Coughing, coughing up blood, or coughing up blood-tinged mucus.  Chest pain that is often worse with deep breaths.  Rapid or irregular heartbeat.  Feeling light-headed or dizzy.  Fainting.  Feeling anxious.  Fever.  Sweating.  Pain and swelling in a leg. This is a symptom of DVT, which can lead to PE. How is this diagnosed? This condition may be diagnosed based on:  Your medical history.  A physical exam.  Blood tests.  CT pulmonary angiogram. This test checks blood flow in and around your lungs.  Ventilation-perfusion scan, also called a lung VQ scan. This test measures air flow and blood flow to the lungs.  An ultrasound of the legs.  How is this treated? Treatment for this condition depends on many factors, such as the cause of your PE, your risk for bleeding or developing more clots, and other medical conditions you have. Treatment aims to remove, dissolve, or stop blood clots from forming or growing larger. Treatment may include: 3. Medicines, such as: ? Blood thinning medicines (anticoagulants) to stop clots from forming. ? Medicines that dissolve clots (thrombolytics). 4. Procedures, such  as: ? Using a flexible tube to remove a blood clot (embolectomy) or to deliver medicine to destroy it (catheter-directed thrombolysis). ? Inserting a filter into a large vein that carries blood to the  heart (inferior vena cava). This filter (vena cava filter) catches blood clots before they reach the lungs. ? Surgery to remove the clot (surgical embolectomy). This is rare. You may need a combination of immediate, long-term (up to 3 months after diagnosis), and extended (more than 3 months after diagnosis) treatments. Your treatment may continue for several months (maintenance therapy). You and your health care provider will work together to choose the treatment program that is best for you.  Follow these instructions at home: Medicines 4. Take over-the-counter and prescription medicines only as told by your health care provider. 5. If you are taking an anticoagulant medicine: ? Take the medicine every day at the same time each day. ? Understand what foods and drugs interact with your medicine. ? Understand the side effects of this medicine, including excessive bruising or bleeding. Ask your health care provider or pharmacist about other side effects.  General instructions  Wear a medical alert bracelet or carry a medical alert card that says you have had a PE and lists what medicines you take.  Ask your health care provider when you may return to your normal activities. Avoid sitting or lying for a long time without moving.  Maintain a healthy weight. Ask your health care provider what weight is healthy for you.  Do not use any products that contain nicotine or tobacco, such as cigarettes, e-cigarettes, and chewing tobacco. If you need help quitting, ask your health care provider.  Talk with your health care provider about any travel plans. It is important to make sure that you are still able to take your medicine while on trips.  Keep all follow-up visits as told by your health care  provider. This is important.  Contact a health care provider if:  You missed a dose of your blood thinner medicine.  Get help right away if: 6. You have: ? New or increased pain, swelling, warmth, or redness in an arm or leg. ? Numbness or tingling in an arm or leg. ? Shortness of breath during activity or at rest. ? A fever. ? Chest pain. ? A rapid or irregular heartbeat. ? A severe headache. ? Vision changes. ? A serious fall or accident, or you hit your head. ? Stomach (abdominal) pain. ? Blood in your vomit, stool, or urine. ? A cut that will not stop bleeding. 7. You cough up blood. 8. You feel light-headed or dizzy. 9. You cannot move your arms or legs. 10. You are confused or have memory loss.  These symptoms may represent a serious problem that is an emergency. Do not wait to see if the symptoms will go away. Get medical help right away. Call your local emergency services (911 in the [place] Do not drive yourself to the hospital. Summary  A pulmonary embolism (PE) is a sudden blockage or decrease of blood flow in one or both lungs. PE is a dangerous and life-threatening condition that needs to be treated right away.  Treatments for this condition usually include medicines to thin your blood (anticoagulants) or medicines to break apart blood clots (thrombolytics).  If you are given blood thinners, it is important to take the medicine every day at the same time each day.  Understand what foods and drugs interact with any medicines that you are taking.  If you have signs of PE or DVT, call your local emergency services (911 in the [place] This information is not intended to replace advice given to you by your health care provider. Make  sure you discuss any questions you have with your health care provider. Document Revised: 03/25/2018 Document Reviewed: 03/25/2018 Elsevier Patient Education  2020 [employer].

## 2020-11-09 LAB — GLUCOSE, CAPILLARY: Glucose-Capillary: 110 mg/dL — ABNORMAL HIGH (ref 70–99)

## 2020-11-09 NOTE — Progress Notes (Signed)
Referring Physician(s): Dr. Catha Gosselin, Judie Petit.   Supervising Physician: Gilmer Mor  Patient Status:  [facility] - In-pt  Chief Complaint:  S/p Right lower extremity venogram,IVCgram and mechanicalthrombectomy ofinferior vena cava, right internal and external iliac, common femoral, and femoral veins, balloon angioplasty of the right femoral and common femoral veinson 5/5with Dr. Elby Showers  Subjective:  Patient sitting on a wheelchair eating lunch, Dr. Ewing Schlein from GI at patient's side. Remains on tracheostomy, able to speak more freely  Has no leg complaints.  Not on compression stocking, states that "I am on it most of time."   Allergies: Keppra [levetiracetam] and Latex  Medications: Prior to Admission medications   Medication Sig Start Date End Date Taking? Authorizing Provider  amantadine (SYMMETREL) 50 MG/5ML solution Place 20 mLs (200 mg total) into feeding tube 2 (two) times daily. 10/28/20   Mikhail, Nita Sells, DO  apixaban (ELIQUIS) 5 MG TABS tablet Place 1 tablet (5 mg total) into feeding tube 2 (two) times daily. 10/28/20   Edsel Petrin, DO  cholecalciferol (VITAMIN D3) 25 MCG (1000 UNIT) tablet Take 1,000 Units by mouth daily.    [provider]  diltiazem (CARDIZEM) 10 mg/ml oral suspension Place 3 mLs (30 mg total) into feeding tube every 6 (six) hours. 10/28/20   Mikhail, Nita Sells, DO  doxazosin (CARDURA) 2 MG tablet Place 1 tablet (2 mg total) into feeding tube daily. 10/28/20   Mikhail, Nita Sells, DO  EPINEPHrine 0.3 mg/0.3 mL IJ SOAJ injection Inject 0.3 mg into the muscle once as needed for anaphylaxis. 02/07/20   [provider]  feeding supplement (ENSURE ENLIVE / ENSURE PLUS) LIQD Take 237 mLs by mouth 2 (two) times daily between meals. 10/28/20   Mikhail, Nita Sells, DO  guaiFENesin-dextromethorphan (ROBITUSSIN DM) 100-10 MG/5ML syrup Place 5 mLs into feeding tube every 4 (four) hours as needed for cough. 10/28/20   Mikhail, Nita Sells, DO  insulin aspart  (NOVOLOG) 100 UNIT/ML injection Inject 0-20 Units into the skin every 4 (four) hours. 10/28/20   Mikhail, Nita Sells, DO  insulin glargine (LANTUS) 100 UNIT/ML injection Inject 0.06 mLs (6 Units total) into the skin daily. 10/28/20   Mikhail, Nita Sells, DO  Nutritional Supplements (FEEDING SUPPLEMENT, PROSOURCE TF,) liquid Place 45 mLs into feeding tube 3 (three) times daily. 10/28/20   Mikhail, Nita Sells, DO  Nutritional Supplements (FEEDING SUPPLEMENT, VITAL 1.5 CAL,) LIQD Place 960 mLs into feeding tube daily. 10/28/20   Mikhail, Nita Sells, DO  oxyCODONE (OXY IR/ROXICODONE) 5 MG immediate release tablet Place 1 tablet (5 mg total) into feeding tube every 6 (six) hours as needed (RASS goal 0). 10/28/20   Edsel Petrin, DO  QUEtiapine (SEROQUEL) 25 MG tablet Take 0.5 tablets (12.5 mg total) by mouth at bedtime. 10/28/20   Edsel Petrin, DO  Water For Irrigation, Sterile (FREE WATER) SOLN Place 200 mLs into feeding tube every 4 (four) hours. 10/28/20   Edsel Petrin, DO     Vital Signs: BP (!) 146/72 (BP Location: Left Arm)   Pulse 86   Temp 98.4 F (36.9 C) (Oral)   Resp 18   Wt 197 lb 12 oz (89.7 kg)   SpO2 97%   BMI 25.39 kg/m   Physical Exam Vitals reviewed.  Constitutional:      General: He is not in acute distress. HENT:     Head: Normocephalic and atraumatic.  Neck:     Comments: Positive for tracheostomy Cardiovascular:     Rate and Rhythm: Normal rate.  Pulmonary:     Effort: No respiratory  distress.  Musculoskeletal:        General: No swelling or tenderness.     Right lower leg: No edema.     Left lower leg: No edema.     Comments: Bilateral lower extremity symmetric, no edema or  erythremia.  No tenderness to palpation bilaterally.   Skin:    General: Skin is warm and dry.     Findings: No erythema.  Neurological:     Mental Status: He is alert. Mental status is at baseline.  Psychiatric:        Mood and Affect: Mood normal.        Behavior: Behavior normal.         Judgment: Judgment normal.     Imaging: No results found.  Labs:  CBC: Recent Labs    10/23/20 0751 10/25/20 0212 10/27/20 0144 10/28/20 0226 10/29/20 0706  WBC 8.4 8.3 7.2  --  6.1  HGB 9.2* 9.0* 9.2* 8.3* 9.0*  HCT 29.6* 28.7* 29.2* 25.6* 28.3*  PLT 175 181 199  --  227    COAGS: Recent Labs    09/25/20 0309 10/25/20 1041  INR 1.0 1.1  APTT 27 34    BMP: Recent Labs    12/13/19 1155 03/02/20 0853 09/25/20 0309 10/03/20 5329 10/03/20 0854 10/28/20 0226 10/28/20 1845 10/29/20 0706 11/06/20 0510  NA 139 141   < > QUESTIONABLE RESULTS, RECOMMEND RECOLLECT TO VERIFY   < > 133* 134* 133* 134*  K 4.3 4.1   < > QUESTIONABLE RESULTS, RECOMMEND RECOLLECT TO VERIFY   < > 4.0 4.0 4.0 4.0  CL 102 103   < > QUESTIONABLE RESULTS, RECOMMEND RECOLLECT TO VERIFY   < > 101 101 100 101  CO2 27 23   < > QUESTIONABLE RESULTS, RECOMMEND RECOLLECT TO VERIFY   < > 24 25 25 26   GLUCOSE 103* 115*   < > QUESTIONABLE RESULTS, RECOMMEND RECOLLECT TO VERIFY   < > 137* 107* 122* 123*  BUN 13 13   < > QUESTIONABLE RESULTS, RECOMMEND RECOLLECT TO VERIFY   < > 12 8 7* 7*  CALCIUM 9.8 9.4   < > QUESTIONABLE RESULTS, RECOMMEND RECOLLECT TO VERIFY   < > 8.4* 8.6* 8.6* 9.0  CREATININE 1.04 0.98   < > QUESTIONABLE RESULTS, RECOMMEND RECOLLECT TO VERIFY   < > 0.68 0.68 0.68 0.70  GFRNONAA >60 81   < > QUESTIONABLE RESULTS, RECOMMEND RECOLLECT TO VERIFY   < > >60 >60 >60 >60  GFRAA >60 94  --  QUESTIONABLE RESULTS, RECOMMEND RECOLLECT TO VERIFY  --   --   --   --   --    < > = values in this interval not displayed.    LIVER FUNCTION TESTS: Recent Labs    10/18/20 0734 10/22/20 0326 10/27/20 1053 10/29/20 0706  BILITOT 1.0 0.8 0.8 0.9  AST 61* 37 39 27  ALT 136* 79* 57* 45*  ALKPHOS 129* 116 95 84  PROT 6.3* 5.6* 5.7* 5.9*  ALBUMIN 2.8* 2.5* 2.9* 2.9*    Assessment and Plan:  S/p Right lower extremity venogram,IVCgram and mechanicalthrombectomy ofinferior vena cava, right  internal and external iliac, common femoral, and femoral veins, balloon angioplasty of the right femoral and common femoral veinson 5/5with Dr. 12/29/20   Patient stable, no leg complaints. Remains on tracheostomy.  On Eliquis 5 mg BID. Patient not on tigh high compression stockings, patient states that he wears them most of the time.  Informed patient again  the importance of the compression stockings, and possible recurrent DVT if patient keeps taking them off.  Patient verbalized understanding.   Further treatment plan per PCCM/ PT/ OT  Appreciate and agree with the plan.  IR to follow remotely.  Please call IR for questions regarding DVT thrombectomy.    Electronically Signed: Willette Brace, PA-C 11/09/2020, 11:06 AM   I spent a total of 15 Minutes at the the patient's bedside AND on the patient's hospital floor or unit, greater than 50% of which was counseling/coordinating care for  DVT thrombectomy.

## 2020-11-09 NOTE — Progress Notes (Signed)
NAME:  Walter [name], MRN:  [id number], DOB:  1956/04/20, LOS: 12 ADMISSION DATE:  10/28/2020, CONSULTATION DATE:  5/2 REFERRING MD:  Kisteins , CHIEF COMPLAINT:  Trach management    History of Present Illness:  This is a 65 year old male who was initially admitted to [facility] On 3/28 after presenting for a code stroke with initial symptoms of headache and right-sided weakness.  Admitted for intraparenchymal hemorrhage involving the left pons with subarachnoid extension.  Course complicated by ineffective airway clearance requiring intubation, cerebral edema, and intraventricular hemorrhage.  Progress in ICU was slow, course complicated by failed attempt at extubation requiring tracheostomy placement on 4/6.  Also Exidine of bacterial bacteremia, significant deconditioning, and bilateral DVT requiring IVC placement.  Ultimately liberated from mechanical ventilation, and on 4/22 moved out of the intensive care.  She was supported on the medical ward from the 22nd up until 4/29 at which time she was transferred to acute rehab.  Pulmonary asked to follow for tracheostomy management.  Pertinent  Medical History  Atrial fibrillation with prior ablation not on anticoagulation. Acute intraparenchymal hemorrhage involving the dorsal left pons with subarachnoid extension.  Complicated by decreased functional ability and right-sided hemiparesis with cognitive deficits Grade 1 diastolic dysfunction Lower ext DVT c/b obstructed/clotted IVC filter req mechanical thrombectomy and removal of IVC filter.  Prolonged mechanical ventilation with ineffective airway clearance requiring tracheostomy  Significant Hospital Events: Including procedures, antibiotic start and stop dates in addition to other pertinent events   . 3/28 admitted with intraparenchymal hemorrhage required intubation  3/29- EVD placement for developing hydrocephalus. CTA of the head was unremarkable  3/30- Extubated.  4/1  respiratory culture grew Moraxella, on ceftriaxone  4/4 reintubated because of hypoxia and unresponsiveness  4/6 tracheostomy  4/7 still encephalopathic   4/8 remains encephalopathic  4/10 Doppler shows bilateral DVT. IVC filter placed.   4/11 Blood cultures positive for Acinetobacter   4/12 Fever no worse off [place], off sedative infusions, tolerating trach collar.   4/13 Febrile @ 7am  4/14 - 4/17 4/16 Afebrile Intermittently following simple commands and participated in PT, drowsy today  4/18 febrile. Starting unasyn, sending trach asp  4/19 T Max 101.7, WBC 12.2, ALT elevated, profoundly deconditioned  4/20 awake and following commands. Trach aspirate w few GPC.   4/21 Tolerated trach collar >24h  4/22 D1 diet, transfer out of ICU 4/27 increased LE edema had Venogram showed =Positive for thrombus involving the IVC and iliac veins. Large amount of thrombus involving the infrarenal IVC and IVC filter.Suprarenal IVC is patent. Thrombus in the right iliac veins and thrombus in the proximal femoral veins bilaterally  4/29 Underwent mechanical thrombectomy,removal of IVC filter.  trach changed to cuffless.  to rehab   5/3 Pulm consulted for trach   5/5 for repeat trip to IR for further thrombectomy of residual thrombus in IVC  5/8: Had some trouble with coughing with meals, diet changed  5/9 initiate capping trials with overnight oximetry  5/12 completed capping trials without episodes of desaturation, worsening shortness of breath, or need for suction therefore tracheostomy removed   Interim History / Subjective:  Doing well Objective   Blood pressure (Abnormal) 146/72, pulse 86, temperature 98.4 F (36.9 C), temperature source Oral, resp. rate 18, weight 89.7 kg, SpO2 97 %.        Intake/Output Summary (Last 24 hours) at 11/09/2020 1346 Last data filed at 11/09/2020 0953 Gross per 24 hour  Intake 517 ml  Output 250 ml  Net 267 ml   Filed Weights    11/07/20 0500 11/08/20 0500 11/09/20 0440  Weight: 90.9 kg 89.3 kg 89.7 kg    Examination: General 65 year old male patient is up in wheelchair he is in no acute distress HEENT normocephalic with the exception of mild left facial droop.  Prior to decannulation his tracheostomy was capped his voice quality was soft but audible, following decannulation he now has a occlusive dressing in place voice quality a little weaker than predecannulation but improves dramatically by simply occluding trach stoma Band-Aid with hand Pulmonary clear to auscultation no accessory use currently room air Cardiac regular irregular Abdomen is soft nontender no organomegaly Extremities are warm and dry with brisk capillary refill Neuro intact  Labs/imaging that I havepersonally reviewed  (right click and "Reselect all SmartList Selections" daily)  See below  Resolved Hospital Problem list     Assessment & Plan:  Trach dependence secondary to ineffective airway clearance  S/p Intraparenchymal hemorrhage w/ right sided hemiplegia Bilateral DVT Deconditioning  Htn H/o afib H/o urinary retention Macrocytic anemia   Pulm problem list  Trach dependence   Discussion Tracheostomy has been capped/occluded since 5/9.  He has had no episodes of desaturation, his cough has been effective, he has not required suctioning.  Overall he feels well.  He remains on room air.  I removed his tracheostomy  Plan Keep current tracheostomy dressing in place for 48 hours After 48 hours can simply change Band-Aid daily May not submerge underwater until stoma completely closed but okay to shower with Band-Aid in place May clean around stoma site with regular soap and water Encourage him to splint stoma site for cough and to assist with quality of phonation Anticipate stoma will continue to close over the next 7 to 10 days, if stoma does not completely closed after 14 days he would need ENT referral for tracheocutaneous  fistula  I will see him again tomorrow to check on his progress    .    Best practice (right click and "Reselect all SmartList Selections" daily)  Per primary   Simonne Martinet ACNP-BC [facility] Pulmonary/Critical Care Pager # [phone] OR # [phone] if no answer

## 2020-11-09 NOTE — Progress Notes (Signed)
PROGRESS NOTE   Subjective/Complaints:  Remained capped all night x 3 nights ROS-   Pt denies SOB, abd pain, CP, N/V/C/D, and vision changes- double vision improving   Objective:   No results found. No results for input(s): WBC, HGB, HCT, PLT in the last 72 hours. No results for input(s): NA, K, CL, CO2, GLUCOSE, BUN, CREATININE, CALCIUM in the last 72 hours.  Intake/Output Summary (Last 24 hours) at 11/09/2020 0734 Last data filed at 11/08/2020 1806 Gross per 24 hour  Intake 542 ml  Output 300 ml  Net 242 ml     Pressure Injury 10/21/20 Head Left;Posterior Stage 1 -  Intact skin with non-blanchable redness of a localized area usually over a bony prominence. Pink non-blanchable scalp with thinning hair on posterior left side of head (Active)  10/21/20 1100  Location: Head  Location Orientation: Left;Posterior  Staging: Stage 1 -  Intact skin with non-blanchable redness of a localized area usually over a bony prominence.  Wound Description (Comments): Pink non-blanchable scalp with thinning hair on posterior left side of head  Present on Admission: No    Physical Exam: Vital Signs Blood pressure (!) 146/72, pulse 86, temperature 98.4 F (36.9 C), temperature source Oral, resp. rate 18, weight 89.7 kg, SpO2 97 %.  ENT- trach #6 Shiley, capped  Throat is dry   General: No acute distress Mood and affect are appropriate Heart: Regular rate and rhythm no rubs murmurs or extra sounds Lungs: Clear to auscultation, breathing unlabored, no rales or wheezes Abdomen: Positive bowel sounds, soft nontender to palpation, nondistended Extremities: No clubbing, cyanosis, or edema Skin: No evidence of breakdown, no evidence of rash  Neurologic: , motor strength is 4/5 in bilateral deltoid, bicep, tricep, grip, hip flexor, knee extensors, ankle dorsiflexor and plantar flexor Sensory exam normal sensation to light touch and  proprioception in bilateral upper and lower extremities Cerebellar exam severe dysmetria Left FNF     Musculoskeletal: Full range of motion in all 4 extremities. No joint swelling, no joint effusion R foot, No pain with ROM    Assessment/Plan: 1. Functional deficits which require 3+ hours per day of interdisciplinary therapy in a comprehensive inpatient rehab setting.  Physiatrist is providing close team supervision and 24 hour management of active medical problems listed below.  Physiatrist and rehab team continue to assess barriers to discharge/monitor patient progress toward functional and medical goals  Care Tool:  Bathing    Body parts bathed by patient: Right arm,Left arm,Chest,Face,Right upper leg,Left upper leg,Abdomen   Body parts bathed by helper: Buttocks,Right upper leg,Left upper leg,Right lower leg,Left lower leg     Bathing assist Assist Level: Minimal Assistance - Patient > 75% (+2 for safety)     Upper Body Dressing/Undressing Upper body dressing   What is the patient wearing?: Pull over shirt    Upper body assist Assist Level: Minimal Assistance - Patient > 75%    Lower Body Dressing/Undressing Lower body dressing      What is the patient wearing?: Pants     Lower body assist Assist for lower body dressing: Minimal Assistance - Patient > 75%     Toileting Toileting  Toileting assist Assist for toileting: Dependent - Patient 0% (min A with stedy)     Transfers Chair/bed transfer  Transfers assist  Chair/bed transfer activity did not occur: Safety/medical concerns  Chair/bed transfer assist level: 2 Helpers     Locomotion Ambulation   Ambulation assist   Ambulation activity did not occur: Safety/medical concerns  Assist level: 2 helpers Assistive device: Walker-rolling Max distance: 20'   Walk 10 feet activity   Assist  Walk 10 feet activity did not occur: Safety/medical concerns        Walk 50 feet activity   Assist Walk  50 feet with 2 turns activity did not occur: Safety/medical concerns         Walk 150 feet activity   Assist Walk 150 feet activity did not occur: Safety/medical concerns         Walk 10 feet on uneven surface  activity   Assist Walk 10 feet on uneven surfaces activity did not occur: Safety/medical concerns         Wheelchair     Assist Will patient use wheelchair at discharge?:  (TBD)             Wheelchair 50 feet with 2 turns activity    Assist            Wheelchair 150 feet activity     Assist          Blood pressure (!) 146/72, pulse 86, temperature 98.4 F (36.9 C), temperature source Oral, resp. rate 18, weight 89.7 kg, SpO2 97 %.  Medical Problem List and Plan: 1. Right side weakness and slurred speech secondary to intracerebral hemorrhage complicated by hydrocephalus. Status post EVD placed per neurosurgery -patient may not shower -ELOS/Goals: 5/26 -con't PT and OT and SLP \\CCM - trach team f/u hopefully will decannulate today  2. Acute PE/bilateral lower extremity DVT: Status post IVC filter 10/08/2020 with thrombectomy and retrieval of IVC filter 10/28/2020 per interventional radiology (still has suprarenal filter ) No RLE, scheduled for repeat thrombectomy this week -DVT/anticoagulation: Continue Eliquis-  5/7- per IR< also needs SCDs at all times in bed- IR went over with nursing. -antiplatelet therapy: N/A 3. Pain Management: Decrease oxycodone to q12H prn  5/8- pt denies pain- con't regimen 4. Mood: Amantadine 200 mg twice daily, may need to reduce if pt becomes agitated  -antipsychotic agents: N/A 5. Neuropsych: This patient is not capable of making decisions on his own behalf. 6. Skin/Wound Care: Routine skin checks 7. Fluids/Electrolytes/Nutrition: Routine in and outs with follow-up chemistries 8. PAF. Status post ablation cardioversion in the Past.  Continue Eliquis. Cardio exam 30 mg every 6 hours. Cardiac rate becoming Huston Foley will reduce diltiazem to 30mg   TID Come increased fluctuation of HR but overall controlled in 80-95bpm range 9. Acute hypoxic respiratory failure. Tracheostomy 10/04/2020 per DrChand. Presently with a #6 Shiley. PMV as tolerated. Follow-up speech therapy. Daily weights. 10. Hospital-acquired pneumonia/ventilator associated pneumonia. Infectious disease follow-up antibiotic therapy completed  11. Dysphagia. Dysphagia #3 thin liquids.good intake 12. Hyperglycemia related to tube feeds. Resolved, off lantus  CBG (last 3)  Recent Labs    11/07/20 1628 11/08/20 2013 11/09/20 0031  GLUCAP 103* 122* 110*  will d/c CBG not diabetic will be off TF- CBGs nl- will d/c  13.  RIght foot pain ? Tibial nerve irritation post procedure circulation is good, no MSK exam abnl 14. Insomnia  Reduced trazodone after discussion with Phrmacy regarding potential for bradycardia  5/10 slept better off TF  LOS: 12 days A FACE TO FACE EVALUATION WAS PERFORMED  Erick Colace 11/09/2020, 7:34 AM

## 2020-11-09 NOTE — Progress Notes (Signed)
Physical Therapy Session Note  Patient Details  Name: Walter [name] MRN: [id number] Date of Birth: 27-Sep-1955  Today's Date: 11/09/2020 PT Individual Time: 1301-1410 PT Individual Time Calculation (min): 69 min   Short Term Goals: Week 1:  PT Short Term Goal 1 (Week 1): Pt will consistently perform supine<>sit with mod assist PT Short Term Goal 1 - Progress (Week 1): Met PT Short Term Goal 2 (Week 1): Pt will perform sit<>stands using LRAD with +2 mod assist PT Short Term Goal 2 - Progress (Week 1): Met PT Short Term Goal 3 (Week 1): Pt will perform bed<>chair transfers using LRAD with +2 mod assist PT Short Term Goal 3 - Progress (Week 1): Met PT Short Term Goal 4 (Week 1): Pt will tolerate sitting upright, OOB in TIS w/c for at least 2 hours outside of therapy sessions PT Short Term Goal 4 - Progress (Week 1): Met PT Short Term Goal 5 (Week 1): Pt will ambulate at least 30f using LRAD with +2 mod assist PT Short Term Goal 5 - Progress (Week 1): Met Week 2:  PT Short Term Goal 1 (Week 2): Pt will consistently perform supine<>sit with supervision. PT Short Term Goal 2 (Week 2): Pt will perform sit<>stands using LRAD with CGA. PT Short Term Goal 3 (Week 2): Pt will perform SPVT transfers using LRAD with Min A. PT Short Term Goal 4 (Week 2): Pt will ambulate at least 121fusing LRAD with +2 Min A with wide BOS.  Skilled Therapeutic Interventions/Progress Updates:  Patient seated upright in TIS w/c on entrance to room. Patient alert and agreeable to PT session. Patient denied pain during session. Relates he has just had his trach removed with bandage over open stoma at anterior neck. Bandage already rolling off at edges d/t extensive hair growth at anterior neck. RN present to replace dressing with additional tape. Pt requires vc/ tc and continued education re: proper hold to stoma during talking and coughing.   Therapeutic Activity: Transfers: Patient performed STS transfers with CGA for  safety in balance to RW. SPVT transfers performed using RW and with BUE support to therapist's elbows with Min A for safety and balance. Provided verbal cues for technique and sequencing throughout.  Toilet transfer performedat end of session w/c <> toilet using safety rail in bathroom and armrest on w/c. Requires Min A for safety and balance. Max A for pericare, mod A for donning/ doffing pants.  Neuromuscular Re-ed: NMR facilitated during session with focus on sitting balance, trunk control, and BUE motor control. Pt guided in sitting balance activities including forward reach using hands on rolling stool with focus on trunk flexion toward target and return to upright seated position between efforts. Good accuracy and return to upright seated position performed with CGA throughout. Seated reaches with BUE using clip to retrieve colored discs from tray table positioned outside of pt's BOS. Pt undershoots most attempts with RUE and req's Min A to reach. Pt also guided in LLE kick to cones placed in arch shape in front of pt. Pt's accuracy is 90% with difficulty to far R or L. NMR performed for improvements in motor control and coordination, balance, sequencing, judgement, and self confidence/ efficacy in performing all aspects of mobility at highest level of independence.   Patient seated upright in w/c at end of session and handed off to SLP for session. All needs within reach.  Therapy Documentation Precautions:  Precautions Precautions: Fall Precaution Comments: NG tube, trach, PMSV, R hemiparesis, hx  of DVTs and PEs Other Brace: BUE resting hand splints, see schedule Restrictions Weight Bearing Restrictions: No  Therapy/Group: Individual Therapy  Alger Simons PT, DPT 11/09/2020, 7:42 PM

## 2020-11-09 NOTE — Progress Notes (Signed)
Occupational Therapy Session Note  Patient Details  Name: Walter [name] MRN: [id number] Date of Birth: 20-Jun-1956  Today's Date: 11/09/2020 OT Co-Treatment Time: 1000-1030 OT Co-Treatment Time Calculation (min): 30 min   Short Term Goals: Week 2:  OT Short Term Goal 1 (Week 2): Pt will complete STS in prep for standing ADL with mod A of 1. OT Short Term Goal 2 (Week 2): Pt will complete seated grooming with min A for task completion. OT Short Term Goal 3 (Week 2): Pt will complete BSC or toilet transfer with 1 assist and LRAD  Skilled Therapeutic Interventions/Progress Updates:     Pt received semi-reclined in bed with daughter present, denies pain throughout session, agreeable to therapy. Session focus on co-tx with PT re dynamic standing balance, functional mobility, activity tolerance, and seated grooming. Came to sitting EOB with CGA and use of bed rail. Stedy transfer to toilet 2/2 urgency, pt req min A for balance during LB clothing management. Cont void of bladder. Stedy transfer to [place]. Seated at sink, shaved face with razor, req light min A for thoroughness and min VCs to maintain midline orientation using mirror for visual feedback. Brushed hair with min A to brush L side. Amb 3x50 ft with mod A + 2 with RW + L orthotic grip. PT provided 6 lb ankle weight to LLE to increase proprioception + L hip flexion + decrease step length with good results. Additionally provided eye patch this date to decrease diplopia. Pt satO2 at 99% on RA after activity. Agreeable to remain seated in TIS.  Pt left in TIS with safety belt alarm engaged, call bell in reach, and all immediate needs met.    Therapy Documentation Precautions:  Precautions Precautions: Fall Precaution Comments: NG tube, trach, PMSV, R hemiparesis, hx of DVTs and PEs Other Brace: BUE resting hand splints, see schedule Restrictions Weight Bearing Restrictions: No Pain: denies   ADL:  See Care Tool for more  details.   Therapy/Group: Individual Therapy  [name] MS, OTR/L  11/09/2020, 7:18 AM

## 2020-11-09 NOTE — Progress Notes (Signed)
Patient ID: Walter [name], male   DOB: [birth year], 65 y.o.   MRN: [id number]   Pt demographics and H&P faxed to [facility] agency of choice.  [place], [place] [phone]

## 2020-11-09 NOTE — Progress Notes (Signed)
Speech Language Pathology Daily Session Note  Patient Details  Name: Walter [name] MRN: [id number] Date of Birth: 04/24/1956  Today's Date: 11/09/2020 SLP Individual Time: 1410-1500 SLP Individual Time Calculation (min): 50 min  Short Term Goals: Week 2: SLP Short Term Goal 1 (Week 2): Patient will utilize an increased vocal intensity at the phrase leve to achieve ~75% intelligibility with Mod verbal cues. SLP Short Term Goal 2 (Week 2): Patient will consume current diet with minimal overt s/s of aspiration with Mod verbal cues for use of swallowing compensatory strategies. SLP Short Term Goal 3 (Week 2): Patient will demonstrate sustained attention to functional tasks for 30 minutes with Mod verbal cues for redirection. SLP Short Term Goal 4 (Week 2): Patient will utilize external aids and contextual cues for orientation with Min multimodal cues. SLP Short Term Goal 5 (Week 2): Patient will demonstrate functional problem solving for basic and familiar tasks with Mod A multimodal cues.  Skilled Therapeutic Interventions: Skilled treatment session focused on cognitive and speech goals. Patient received from PT and patient appeared lethargic and had recently been decnnulated. SLP facilitated Max A verbal and tactile cues for appropriate use of 2 fingers of his stoma to maximize vocal intensity. Patient consistently would "pinch" around the stoma without success. Patient was ~75% intelligible at the phrase and sentence level in a moderately distracting environment. Patient transferred back to bed with Max verbal cues needed to maximize safety and reduce impulsivity.  Patient was asleep before SLP left room. Patient left upright in bed with alarm on and all needs within reach. Continue with current plan of care.      Pain No/Denies Pain   Therapy/Group: Individual Therapy  Phinneas Shakoor 11/09/2020, 3:21 PM

## 2020-11-09 NOTE — Progress Notes (Signed)
Physical Therapy Session Note  Patient Details  Name: Walter [name] MRN: [id number] Date of Birth: 10/22/1955  Today's Date: 11/09/2020 PT Co-Treatment Time: 0930-1000 PT Co-Treatment Time Calculation (min): 30 min  Short Term Goals: Week 2:  PT Short Term Goal 1 (Week 2): Pt will consistently perform supine<>sit with supervision. PT Short Term Goal 2 (Week 2): Pt will perform sit<>stands using LRAD with CGA. PT Short Term Goal 3 (Week 2): Pt will perform SPVT transfers using LRAD with Min A. PT Short Term Goal 4 (Week 2): Pt will ambulate at least 28f using LRAD with +2 Min A with wide BOS.  Skilled Therapeutic Interventions/Progress Updates:   Pt received supine in bed and agreeable to PT/OT cotreat. Stedy transfer to toilet in bathroom with min assist. Pt able to void on toilet. Pt performed lower body dressing in standing with mi assist for safety and balance. Oral care and facial hygiene at sink with mod assist for set up and safety due to ataxia on the LUE. .[name]  [facility] training with RW and L hand splint with mod assist +2 for safety 776fx 2 and 12040fPT applied hip/knee ankle flexion and compression wrap to LLE to improve proprioceptive feedback on first bout and then applied 5# ankle weight to the LLE for all additional gait training. Noted to have improved step width with both feedback types, but noted to have proper step length with ankle weight vs Tband wrap.   [facility] returned to [facility] and [facility] and all needs met.        Therapy Documentation Precautions:  Precautions Precautions: Fall Precaution Comments: NG tube, trach, PMSV, R hemiparesis, hx of DVTs and PEs Other Brace: BUE resting hand splints, see schedule Restrictions Weight Bearing Restrictions: No Vital Signs: Therapy Vitals Temp: 97.8 F (36.6 C) Pulse Rate: 96 Resp: 16 BP: 133/82 Patient Position (if appropriate): Lying Oxygen  Therapy SpO2: 100 % O2 Device: Room Air Pain: denies   Therapy/Group: Individual Therapy  AusLorie Phenix12/2022, 4:26 PM

## 2020-11-10 MED ORDER — CLONAZEPAM 0.125 MG PO TBDP
0.1250 mg | ORAL_TABLET | Freq: Every evening | ORAL | Status: DC | PRN
  Filled 2020-11-10: qty 1

## 2020-11-10 NOTE — Progress Notes (Signed)
Physical Therapy Session Note  Patient Details  Name: Walter [name] MRN: [id number] Date of Birth: [birth year]  Today's Date: 11/10/2020 PT Individual Time: 1300-1400 PT Individual Time Calculation (min): 60 min   Short Term Goals:  Week 2:  PT Short Term Goal 1 (Week 2): Pt will consistently perform supine<>sit with supervision. PT Short Term Goal 2 (Week 2): Pt will perform sit<>stands using LRAD with CGA. PT Short Term Goal 3 (Week 2): Pt will perform SPVT transfers using LRAD with Min A. PT Short Term Goal 4 (Week 2): Pt will ambulate at least 38f using LRAD with +2 Min A with wide BOS.  Skilled Therapeutic Interventions/Progress Updates:   Pt received sitting in WC and agreeable to PT. Pt performed gait training with RW with mod assist +2 and hand splint [id number] for improved posture, increased step width, anda gait pattern to prevent crossing feet in turn to reduce fall risk. Pt [facility]. PT instructed p tin WC mobility x [id number] min assist throughout to improve attention to the LUE with tband on wheel rim. Stand pivot transfer to nustep with min assist and RW. nustep reciprocal movement training x 6 minutes with max cues for improved control and coordination movement throughout . Pt returned to room and performed stand pivot transfer to bed with RW and min-mod assist. Sit>supine completed with min assist, and left supine in bed with call bell in reach and all needs met.        Therapy Documentation Precautions:  Precautions Precautions: Fall Precaution Comments: NG tube, trach, PMSV, R hemiparesis, hx of DVTs and PEs Other Brace: BUE resting hand splints, see schedule Restrictions Weight Bearing Restrictions: No Vital Signs: Therapy Vitals Temp: 97.9 F (36.6 C) Pulse Rate: 92 Resp: 19 BP: 137/81 Patient Position (if appropriate): Lying Oxygen Therapy SpO2: 97 % O2 Device: Room  Air Pain: denies   Therapy/Group: Individual Therapy  AustLorie Phenix3/2022, 5:58 PM

## 2020-11-10 NOTE — Progress Notes (Signed)
PROGRESS NOTE   Subjective/Complaints: occ cough with meal Appreciate trach team consult , pt breathing well post decannulation  ROS-   Pt denies SOB, abd pain, CP, N/V/C/D, and vision changes- double vision improving   Objective:   No results found. No results for input(s): WBC, HGB, HCT, PLT in the last 72 hours. No results for input(s): NA, K, CL, CO2, GLUCOSE, BUN, CREATININE, CALCIUM in the last 72 hours.  Intake/Output Summary (Last 24 hours) at 11/10/2020 0807 Last data filed at 11/10/2020 0630 Gross per 24 hour  Intake 680 ml  Output 1300 ml  Net -620 ml     Pressure Injury 10/21/20 Head Left;Posterior Stage 1 -  Intact skin with non-blanchable redness of a localized area usually over a bony prominence. Pink non-blanchable scalp with thinning hair on posterior left side of head (Active)  10/21/20 1100  Location: Head  Location Orientation: Left;Posterior  Staging: Stage 1 -  Intact skin with non-blanchable redness of a localized area usually over a bony prominence.  Wound Description (Comments): Pink non-blanchable scalp with thinning hair on posterior left side of head  Present on Admission: No    Physical Exam: Vital Signs Blood pressure 126/79, pulse 79, temperature 98 F (36.7 C), resp. rate 18, weight 91.9 kg, SpO2 98 %.  ENT-trach site occlusivedressing   General: No acute distress Mood and affect are appropriate Heart: Regular rate and rhythm no rubs murmurs or extra sounds Lungs: Clear to auscultation, breathing unlabored, no rales or wheezes Abdomen: Positive bowel sounds, soft nontender to palpation, nondistended Extremities: No clubbing, cyanosis, or edema Skin: No evidence of breakdown, no evidence of rash Oriented x3 with cuing for date Neurologic: , motor strength is 4/5 in bilateral deltoid, bicep, tricep, grip, hip flexor, knee extensors, ankle dorsiflexor and plantar flexor Sensory exam  normal sensation to light touch and proprioception in bilateral upper and lower extremities Cerebellar exam severe dysmetria Left FNF     Musculoskeletal: Full range of motion in all 4 extremities. No joint swelling, no joint effusion R foot, No pain with ROM    Assessment/Plan: 1. Functional deficits which require 3+ hours per day of interdisciplinary therapy in a comprehensive inpatient rehab setting.  Physiatrist is providing close team supervision and 24 hour management of active medical problems listed below.  Physiatrist and rehab team continue to assess barriers to discharge/monitor patient progress toward functional and medical goals  Care Tool:  Bathing    Body parts bathed by patient: Right arm,Left arm,Chest,Face,Right upper leg,Left upper leg,Abdomen   Body parts bathed by helper: Buttocks,Right upper leg,Left upper leg,Right lower leg,Left lower leg     Bathing assist Assist Level: Minimal Assistance - Patient > 75% (+2 for safety)     Upper Body Dressing/Undressing Upper body dressing   What is the patient wearing?: Pull over shirt    Upper body assist Assist Level: Minimal Assistance - Patient > 75%    Lower Body Dressing/Undressing Lower body dressing      What is the patient wearing?: Pants     Lower body assist Assist for lower body dressing: Minimal Assistance - Patient > 75%     Toileting Toileting  Toileting assist Assist for toileting: Dependent - Patient 0% (min A with stedy)     Transfers Chair/bed transfer  Transfers assist  Chair/bed transfer activity did not occur: Safety/medical concerns  Chair/bed transfer assist level: 2 Helpers     Locomotion Ambulation   Ambulation assist   Ambulation activity did not occur: Safety/medical concerns  Assist level: 2 helpers Assistive device: Walker-rolling Max distance: 20'   Walk 10 feet activity   Assist  Walk 10 feet activity did not occur: Safety/medical concerns         Walk 50 feet activity   Assist Walk 50 feet with 2 turns activity did not occur: Safety/medical concerns         Walk 150 feet activity   Assist Walk 150 feet activity did not occur: Safety/medical concerns         Walk 10 feet on uneven surface  activity   Assist Walk 10 feet on uneven surfaces activity did not occur: Safety/medical concerns         Wheelchair     Assist Will patient use wheelchair at discharge?:  (TBD)             Wheelchair 50 feet with 2 turns activity    Assist            Wheelchair 150 feet activity     Assist          Blood pressure 126/79, pulse 79, temperature 98 F (36.7 C), resp. rate 18, weight 91.9 kg, SpO2 98 %.  Medical Problem List and Plan: 1. Right side weakness and slurred speech secondary to intracerebral hemorrhage complicated by hydrocephalus. Status post EVD placed per neurosurgery -patient may not shower -ELOS/Goals: 5/26 -con't PT and OT and SLP \\CCM - trach team f/u hopefully will decannulate today  2. Acute PE/bilateral lower extremity DVT: Status post IVC filter 10/08/2020 with thrombectomy and retrieval of IVC filter 10/28/2020 per interventional radiology (still has suprarenal filter ) No RLE, scheduled for repeat thrombectomy this week -DVT/anticoagulation: Continue Eliquis-  5/7- per IR< also needs SCDs at all times in bed- IR went over with nursing. -antiplatelet therapy: N/A 3. Pain Management: Decrease oxycodone to q12H prn  5/8- pt denies pain- con't regimen 4. Mood: Amantadine 200 mg twice daily, may need to reduce if pt becomes agitated  -antipsychotic agents: N/A 5. Neuropsych: This patient is not capable of making decisions on his own behalf. 6. Skin/Wound Care: Routine skin checks 7. Fluids/Electrolytes/Nutrition: Routine in and outs with follow-up chemistries 8. PAF. Status post ablation cardioversion in the  Past. Continue Eliquis. Cardio exam 30 mg every 6 hours. Cardiac rate becoming Huston Foley will reduce diltiazem to 30mg   TID Come increased fluctuation of HR but overall controlled in 80-95bpm range Vitals:   11/09/20 1953 11/10/20 0424  BP: 122/69 126/79  Pulse: 79 79  Resp: 18 18  Temp: 98 F (36.7 C) 98 F (36.7 C)  SpO2: 96% 98%   9. Acute hypoxic respiratory failure. Tracheostomy 10/04/2020 decannulated 11/09/20  10. Hospital-acquired pneumonia/ventilator associated pneumonia. Infectious disease follow-up antibiotic therapy completed  11. Dysphagia. Dysphagia #3 thin liquids.good intake 12. Hyperglycemia related to tube feeds. Resolved, off lantus  CBG (last 3)  Recent Labs    11/07/20 1628 11/08/20 2013 11/09/20 0031  GLUCAP 103* 122* 110*  will d/c CBG not diabetic will be off TF- CBGs nl- will d/c  13.  RIght foot pain ? Tibial nerve irritation post procedure circulation is good, no MSK exam abnl 14.  Insomnia  Reduced trazodone after discussion with Phrmacy regarding potential for bradycardia  5/10 slept better off TF   LOS: 13 days A FACE TO FACE EVALUATION WAS PERFORMED  Erick Colace 11/10/2020, 8:07 AM

## 2020-11-10 NOTE — Progress Notes (Signed)
Occupational Therapy Session Note  Patient Details  Name: Walter [name] MRN: [id number] Date of Birth: Jun 20, 1956  Today's Date: 11/10/2020 OT Individual Time: 0701-0807 OT Individual Time Calculation (min): 66 min    Short Term Goals: Week 2:  OT Short Term Goal 1 (Week 2): Pt will complete STS in prep for standing ADL with mod A of 1. OT Short Term Goal 2 (Week 2): Pt will complete seated grooming with min A for task completion. OT Short Term Goal 3 (Week 2): Pt will complete BSC or toilet transfer with 1 assist and LRAD  Skilled Therapeutic Interventions/Progress Updates:    Pt received semi-reclined in bed asleep, easily awakened by voice, agreeable to therapy.Denies pain throughout session. Came to sitting EOB with CGA and use of bed rail - req consistent CGA to not lose balance posteriorly. Stand-pivot mod A to w/c for balance. Doffed shirt with CGA sitting EOB. Covered trach stoma and L hand IV site with tegaderm in prep for shower. Squat-pivot into shower from TIS with mod A for balance and use of grab bar. Bathed full-body with overall min A for lower BLE. Care taken to avoid water hitting trach stoma site. Pt impulsive and standing during shower, req mod A for balance and mod VCs to return to sitting. Able to bathe buttocks via lateral leans, req A to open soap bottle. Squat-pivot back to w/c same manner as above. Donned shirt close S. Donned underwear with mod A to thread BLE and for balance during standing. Donned thigh-high Teds max A, pt able to assist pulling them up over thighs. Donned pants with overall mod A to thread BLE and for balance during standing. Set-up for breakfast - req A to open containers, but noted improved self-feeding skills with non-weighted utensils this date, but cont to have ~15% spillage.   Pt left seated in TIS with telesitter on, chair alarm engaged, call bell in reach, and all immediate needs met. RN notified to alert NT to provide S for  eating.    Therapy Documentation Precautions:  Precautions Precautions: Fall Precaution Comments: NG tube, trach, PMSV, R hemiparesis, hx of DVTs and PEs Other Brace: BUE resting hand splints, see schedule Restrictions Weight Bearing Restrictions: No Pain:   denies ADL: See Care Tool for more details.  Therapy/Group: Individual Therapy  [name] MS, OTR/L  11/10/2020, 6:36 AM

## 2020-11-10 NOTE — Progress Notes (Addendum)
Looks good s/p decannulation.  Phonation quality improved as expected.  No issues over night.   Will sign off call PRN  Simonne Martinet ACNP-BC [facility] Pulmonary/Critical Care Pager # [phone] OR # [phone] if no answer     Patient seen and examined. Sitting up in WC. Denies complaints.   BP 126/79 (BP Location: Left Arm)   Pulse 79   Temp 98 F (36.7 C)   Resp 18   Wt 91.9 kg   SpO2 98%   BMI 26.01 kg/m  Awake, alert CTAB, no accessory muscle use Slow speech but answering questions appropriately, moving extremities.  Trach site covered.  A&P: Acute respiratory failure requiring prolonged MV, tracheostomy -doing well post-decannulation -keep area covered, can transition to bandaid in the next few days -reinforce site when coughing -can try shaving once site is completely closed   PCCM will sign off. Please call with questions.  Steffanie Dunn, DO 11/10/20 11:32 AM [place]

## 2020-11-10 NOTE — Progress Notes (Signed)
Physical Therapy Session Note  Patient Details  Name: Walter [name] MRN: [id number] Date of Birth: January 02, 1956  Today's Date: 11/10/2020 PT Individual Time:  1015-1047 PT Individual Time Calculation (min): 32 min  Short Term Goals: Week 2:  PT Short Term Goal 1 (Week 2): Pt will consistently perform supine<>sit with supervision. PT Short Term Goal 2 (Week 2): Pt will perform sit<>stands using LRAD with CGA. PT Short Term Goal 3 (Week 2): Pt will perform SPVT transfers using LRAD with Min A. PT Short Term Goal 4 (Week 2): Pt will ambulate at least 53ft using LRAD with +2 Min A with wide BOS.  Skilled Therapeutic Interventions/Progress Updates:  Patient seated in TIS w/c on entrance to room. Patient alert and agreeable to PT session. Patient denied pain during session.  Therapeutic Activity: Transfers: Patient performed STS transfers with sup/ CGA to RW. Requires hands on assist d/t ataxic trunk instability. Provided verbal cues for hand placement and progression as well as for full upright stance to attain balance.  Neuromuscular Re-ed: NMR facilitated during session with focus on trunk stability and standing balance as well as ability to problem solve and challenge fine motor for BUE. Pt guided in standing challenge of using picture of foam lego block arrangements to recreate picture. He is able to look at picture and choose correct number of specific pieces in order to correctly recreate. Requires adequate lighting in order to correctly choose number and color of blocks. Requires Mod A to maintain balance and Min A to connect some pieces once he places piece in correct location. Completes 3 pictures. Requires UE support to maintain balance, however, pt chooses to use BUE to put pieces together not realizing that he is compromising balance demonstrating poor safety awareness.  NMR performed for improvements in motor control and coordination, balance, sequencing, judgement, and self confidence/  efficacy in performing all aspects of mobility at highest level of independence.   Patient seated in w/c at end of session with brakes locked, bed alarm set, and all needs within reach.    Therapy Documentation Precautions:  Precautions Precautions: Fall Precaution Comments: NG tube, trach, PMSV, R hemiparesis, hx of DVTs and PEs Other Brace: BUE resting hand splints, see schedule Restrictions Weight Bearing Restrictions: No  Therapy/Group: Individual Therapy  Loel Dubonnet 11/10/2020, 11:59 PM

## 2020-11-10 NOTE — Progress Notes (Signed)
Speech Language Pathology Daily Session Note  Patient Details  Name: Walter [name] MRN: [id number] Date of Birth: January 12, 1956  Today's Date: 11/10/2020 SLP Individual Time: 0900-0955 SLP Individual Time Calculation (min): 55 min  Short Term Goals: Week 2: SLP Short Term Goal 1 (Week 2): Patient will utilize an increased vocal intensity at the phrase leve to achieve ~75% intelligibility with Mod verbal cues. SLP Short Term Goal 2 (Week 2): Patient will consume current diet with minimal overt s/s of aspiration with Mod verbal cues for use of swallowing compensatory strategies. SLP Short Term Goal 3 (Week 2): Patient will demonstrate sustained attention to functional tasks for 30 minutes with Mod verbal cues for redirection. SLP Short Term Goal 4 (Week 2): Patient will utilize external aids and contextual cues for orientation with Min multimodal cues. SLP Short Term Goal 5 (Week 2): Patient will demonstrate functional problem solving for basic and familiar tasks with Mod A multimodal cues.  Skilled Therapeutic Interventions: Skilled treatment session focused on cognitive goals. SLP facilitated session by providing Min A verbal cues for recall of his current medications and their functions. Patient utilized a visual aid for recall of his current medications with Min visual cues to organize a 3X/day pill box. Patient required extra time and overall Mod A verbal cues for problem solving with task with added difficulty due to decreased coordination of his upper extremities. Patient left upright in wheelchair with alarm on and all needs within reach. Continue with current plan of care.      Pain Pain Assessment Pain Scale: 0-10 Pain Score: 0-No pain  Therapy/Group: Individual Therapy  Onesimo Lingard 11/10/2020, 12:42 PM

## 2020-11-10 NOTE — Plan of Care (Signed)
  Problem: Consults Goal: RH STROKE PATIENT EDUCATION Description: See Patient Education module for education specifics  Outcome: Progressing   Problem: RH BOWEL ELIMINATION Goal: RH STG MANAGE BOWEL WITH ASSISTANCE Description: STG Manage Bowel with  mod I Assistance. Outcome: Progressing   Problem: RH BLADDER ELIMINATION Goal: RH STG MANAGE BLADDER WITH ASSISTANCE Description: STG Manage Bladder With  mod I Assistance Outcome: Progressing   Problem: RH SKIN INTEGRITY Goal: RH STG SKIN FREE OF INFECTION/BREAKDOWN Description: Min assist Outcome: Progressing Goal: RH STG MAINTAIN SKIN INTEGRITY WITH ASSISTANCE Description: STG Maintain Skin Integrity With min  Assistance. Outcome: Progressing Goal: RH STG ABLE TO PERFORM INCISION/WOUND CARE W/ASSISTANCE Description: STG Able To Perform Incision/Wound Care With min Assistance. Outcome: Progressing   Problem: RH SAFETY Goal: RH STG ADHERE TO SAFETY PRECAUTIONS W/ASSISTANCE/DEVICE Description: STG Adhere to Safety Precautions With cues/reminders Assistance/Device. Outcome: Progressing   Problem: RH KNOWLEDGE DEFICIT Goal: RH STG INCREASE KNOWLEDGE OF DIABETES Description: Patient and wife will be able to manage DM with medications and dietary modifications using handouts and educational tools independently Outcome: Progressing Goal: RH STG INCREASE KNOWLEDGE OF HYPERTENSION Description: Patient and wife will be able to manage HTN with medications and dietary modifications using handouts and educational tools independently Outcome: Progressing Goal: RH STG INCREASE KNOWLEDGE OF DYSPHAGIA/FLUID INTAKE Description: Patient and wife will be able to manage Dysphagia, medications and dietary modifications using handouts and educational tools independently Outcome: Progressing Goal: RH STG INCREASE KNOWLEGDE OF HYPERLIPIDEMIA Description: Patient and wife will be able to manage HLD with medications and dietary modifications using  handouts and educational tools independently Outcome: Progressing Goal: RH STG INCREASE KNOWLEDGE OF STROKE PROPHYLAXIS Description: Patient and wife will be able to manage Secondary stroke risks with medications and dietary modifications using handouts and educational tools independently Outcome: Progressing   

## 2020-11-11 NOTE — Plan of Care (Signed)
  Problem: Consults Goal: RH STROKE PATIENT EDUCATION Description: See Patient Education module for education specifics  Outcome: Progressing   Problem: RH BOWEL ELIMINATION Goal: RH STG MANAGE BOWEL WITH ASSISTANCE Description: STG Manage Bowel with  mod I Assistance. Outcome: Progressing   Problem: RH BLADDER ELIMINATION Goal: RH STG MANAGE BLADDER WITH ASSISTANCE Description: STG Manage Bladder With  mod I Assistance Outcome: Progressing   Problem: RH SKIN INTEGRITY Goal: RH STG SKIN FREE OF INFECTION/BREAKDOWN Description: Min assist Outcome: Progressing Goal: RH STG MAINTAIN SKIN INTEGRITY WITH ASSISTANCE Description: STG Maintain Skin Integrity With min  Assistance. Outcome: Progressing Goal: RH STG ABLE TO PERFORM INCISION/WOUND CARE W/ASSISTANCE Description: STG Able To Perform Incision/Wound Care With min Assistance. Outcome: Progressing   Problem: RH SAFETY Goal: RH STG ADHERE TO SAFETY PRECAUTIONS W/ASSISTANCE/DEVICE Description: STG Adhere to Safety Precautions With cues/reminders Assistance/Device. Outcome: Progressing   Problem: RH KNOWLEDGE DEFICIT Goal: RH STG INCREASE KNOWLEDGE OF DIABETES Description: Patient and wife will be able to manage DM with medications and dietary modifications using handouts and educational tools independently Outcome: Progressing Goal: RH STG INCREASE KNOWLEDGE OF HYPERTENSION Description: Patient and wife will be able to manage HTN with medications and dietary modifications using handouts and educational tools independently Outcome: Progressing Goal: RH STG INCREASE KNOWLEDGE OF DYSPHAGIA/FLUID INTAKE Description: Patient and wife will be able to manage Dysphagia, medications and dietary modifications using handouts and educational tools independently Outcome: Progressing Goal: RH STG INCREASE KNOWLEGDE OF HYPERLIPIDEMIA Description: Patient and wife will be able to manage HLD with medications and dietary modifications using  handouts and educational tools independently Outcome: Progressing Goal: RH STG INCREASE KNOWLEDGE OF STROKE PROPHYLAXIS Description: Patient and wife will be able to manage Secondary stroke risks with medications and dietary modifications using handouts and educational tools independently Outcome: Progressing   

## 2020-11-11 NOTE — Progress Notes (Signed)
Occupational Therapy Session Note  Patient Details  Name: Walter [name] MRN: [id number] Date of Birth: 1956/05/22  Today's Date: 11/11/2020 OT Group Time: 1100-1200 OT Group Time Calculation (min): 60 min    Short Term Goals: Week 2:  OT Short Term Goal 1 (Week 2): Pt will complete STS in prep for standing ADL with mod A of 1. OT Short Term Goal 2 (Week 2): Pt will complete seated grooming with min A for task completion. OT Short Term Goal 3 (Week 2): Pt will complete BSC or toilet transfer with 1 assist and LRAD  Skilled Therapeutic Interventions/Progress Updates:    Pt participated in rhythmic drumming group. No pain reported  Focus of group on BUE coordination, strengthening, endurance, timing/control, activity tolerance, and social participation and engagement. Pt performs session from seated position for energy conservation. Skilled interventions included larger wrapping around drumstick to improve grip, slower cadence of UE movements for improved timing/control. Warm up performed prior to exercises and UB stretching completed at end of group with demo from OT. Pt able to select preferred song to share with group. Returned pt to room at end of session. Exited session with pt seated in w/c, exit alarm on and call light in reach  Therapy Documentation Precautions:  Precautions Precautions: Fall Precaution Comments: NG tube, trach, PMSV, R hemiparesis, hx of DVTs and PEs Other Brace: BUE resting hand splints, see schedule Restrictions Weight Bearing Restrictions: No General:   Vital Signs:   Pain: Pain Assessment Pain Scale: 0-10 Pain Score: 0-No pain ADL: ADL Eating: Not assessed Grooming: Moderate assistance Where Assessed-Grooming: Edge of bed Upper Body Bathing: Moderate assistance Where Assessed-Upper Body Bathing: Edge of bed Lower Body Bathing: Dependent (+2 assist using Stedy for sit<stand) Where Assessed-Lower Body Bathing: Edge of bed,Other (Comment) (also  sit<stand from [facility] using Stedy) Upper Body Dressing: Maximal assistance Lower Body Dressing: Dependent (+2 assist using Stedy) Where Assessed-Lower Body Dressing: Edge of bed,Other (Comment) (sit<stand from [facility] using Stedy) Toileting: Dependent (+2 using Stedy) Where Assessed-Toileting: Bedside Commode Toilet Transfer: Dependent (+2 using Stedy) Toilet Transfer Method: Surveyor, minerals: Gaffer: Not assessed Vision   Perception    Praxis   Exercises:   Other Treatments:     Therapy/Group: Group Therapy  Shon Hale 11/11/2020, 12:33 PM

## 2020-11-11 NOTE — Progress Notes (Signed)
Occupational Therapy Session Note  Patient Details  Name: Walter [name] MRN: [id number] Date of Birth: 1956-05-21  Today's Date: 11/11/2020 OT Individual Time: 2620-3559 OT Individual Time Calculation (min): 60 min    Short Term Goals: Week 2:  OT Short Term Goal 1 (Week 2): Pt will complete STS in prep for standing ADL with mod A of 1. OT Short Term Goal 2 (Week 2): Pt will complete seated grooming with min A for task completion. OT Short Term Goal 3 (Week 2): Pt will complete BSC or toilet transfer with 1 assist and LRAD  Skilled Therapeutic Interventions/Progress Updates:    Pt received semi-reclined in bed, denies pain, agreeable to therapy. Came to sitting EOB with use of bed rail and CGA. Doffed brief with mod A  to prevent posterior LOB. Continent void of urine and pt able to handle urinal with close S. Donned brief/shorts with overall min A to thread BLE and for balance during STS. Stand-pivot to w/c with min to mod A for balance throughout session. Cont to req cues to slow down and wait for therapist 2/2 impulsivity. Donned B teds and socks with overall max A. Pt req to put in contacts. With multiple attempts, pt req max A to stabilize BUE 2/2 ataxic movements. Combed hair close S. Self-propelled w/c to and from gym with light min A for door/obstable management and cues to use BUE to self-propel. Stand-pivot to and from mat same manner as above. Assessed B grip strength and administered 9HPT with the following results:   R: 30, 40, 35; average of 35 lbs ; 1 min 16 secs L:  31, 41, 46; average of 39 lbs; 2 min 48 secs - ataxic movements and incoordination significantly worse than in RUE  Provided medium soft theraputty and reviewed exercises to improve B grip strength and digit dexterity. Will cont to review.  Pt cont to demonstrate limited insight into deficits/limited safety awareness by asking if he can walk by himself, go home with his wife for a day, etc.  Pt left in w/c with  safety belt alarm engaged, telesitter on, call bell in reach, and all immediate needs met.    Therapy Documentation Precautions:  Precautions Precautions: Fall Precaution Comments: NG tube, trach, PMSV, R hemiparesis, hx of DVTs and PEs Other Brace: BUE resting hand splints, see schedule Restrictions Weight Bearing Restrictions: No Pain: Pain Assessment Pain Scale: 0-10 Pain Score: 0-No pain ADL: See Care Tool for more details.   Therapy/Group: Individual Therapy  [name] MS, OTR/L  11/11/2020, 12:44 PM

## 2020-11-11 NOTE — Progress Notes (Signed)
PROGRESS NOTE   Subjective/Complaints: Pt would like to go home early , discussed that we will discuss in care team but do not anticipate any major change in D/C date due to severe neurologic deficits  Per RN having foul smelling urine and pain with urination  ROS-   Pt denies SOB, abd pain, CP, N/V/C/D, and vision changes- double vision improving   Objective:   No results found. No results for input(s): WBC, HGB, HCT, PLT in the last 72 hours. No results for input(s): NA, K, CL, CO2, GLUCOSE, BUN, CREATININE, CALCIUM in the last 72 hours.  Intake/Output Summary (Last 24 hours) at 11/11/2020 0745 Last data filed at 11/10/2020 1915 Gross per 24 hour  Intake 960 ml  Output 450 ml  Net 510 ml     Pressure Injury 10/21/20 Head Left;Posterior Stage 1 -  Intact skin with non-blanchable redness of a localized area usually over a bony prominence. Pink non-blanchable scalp with thinning hair on posterior left side of head (Active)  10/21/20 1100  Location: Head  Location Orientation: Left;Posterior  Staging: Stage 1 -  Intact skin with non-blanchable redness of a localized area usually over a bony prominence.  Wound Description (Comments): Pink non-blanchable scalp with thinning hair on posterior left side of head  Present on Admission: No    Physical Exam: Vital Signs Blood pressure 120/75, pulse 81, temperature 98 F (36.7 C), resp. rate 18, weight 86.7 kg, SpO2 97 %.  ENT-trach site occlusivedressing   General: No acute distress Mood and affect are appropriate Heart: Regular rate and rhythm no rubs murmurs or extra sounds Lungs: Clear to auscultation, breathing unlabored, no rales or wheezes Abdomen: Positive bowel sounds, soft nontender to palpation, nondistended Extremities: No clubbing, cyanosis, or edema Skin: No evidence of breakdown, no evidence of rash   Neurologic: , motor strength is 4/5 in bilateral deltoid,  bicep, tricep, grip, hip flexor, knee extensors, ankle dorsiflexor and plantar flexor Sensory exam normal sensation to light touch and proprioception in bilateral upper and lower extremities Cerebellar exam severe dysmetria Left FNF     Musculoskeletal: Full range of motion in all 4 extremities. No joint swelling, no joint effusion R foot, No pain with ROM    Assessment/Plan: 1. Functional deficits which require 3+ hours per day of interdisciplinary therapy in a comprehensive inpatient rehab setting.  Physiatrist is providing close team supervision and 24 hour management of active medical problems listed below.  Physiatrist and rehab team continue to assess barriers to discharge/monitor patient progress toward functional and medical goals  Care Tool:  Bathing    Body parts bathed by patient: Right arm,Left arm,Chest,Face,Right upper leg,Left upper leg,Abdomen,Buttocks,Front perineal area   Body parts bathed by helper: Left lower leg,Right lower leg     Bathing assist Assist Level: Minimal Assistance - Patient > 75%     Upper Body Dressing/Undressing Upper body dressing   What is the patient wearing?: Pull over shirt    Upper body assist Assist Level: Supervision/Verbal cueing    Lower Body Dressing/Undressing Lower body dressing      What is the patient wearing?: Pants,Underwear/pull up     Lower body assist Assist for  lower body dressing: Moderate Assistance - Patient 50 - 74%     Toileting Toileting    Toileting assist Assist for toileting: Dependent - Patient 0% (min A with stedy)     Transfers Chair/bed transfer  Transfers assist  Chair/bed transfer activity did not occur: Safety/medical concerns  Chair/bed transfer assist level: Moderate Assistance - Patient 50 - 74%     Locomotion Ambulation   Ambulation assist   Ambulation activity did not occur: Safety/medical concerns  Assist level: 2 helpers Assistive device: Walker-rolling Max distance: 20'    Walk 10 feet activity   Assist  Walk 10 feet activity did not occur: Safety/medical concerns        Walk 50 feet activity   Assist Walk 50 feet with 2 turns activity did not occur: Safety/medical concerns         Walk 150 feet activity   Assist Walk 150 feet activity did not occur: Safety/medical concerns         Walk 10 feet on uneven surface  activity   Assist Walk 10 feet on uneven surfaces activity did not occur: Safety/medical concerns         Wheelchair     Assist Will patient use wheelchair at discharge?:  (TBD)             Wheelchair 50 feet with 2 turns activity    Assist            Wheelchair 150 feet activity     Assist          Blood pressure 120/75, pulse 81, temperature 98 F (36.7 C), resp. rate 18, weight 86.7 kg, SpO2 97 %.  Medical Problem List and Plan: 1. Right side weakness and slurred speech secondary to intracerebral hemorrhage complicated by hydrocephalus. Status post EVD placed per neurosurgery -patient may not shower -ELOS/Goals: 5/26 -con't PT and OT and SLP \\CCM - signed off after decannulation  2. Acute PE/bilateral lower extremity DVT: Status post IVC filter 10/08/2020 with thrombectomy and retrieval of IVC filter 10/28/2020 per interventional radiology (still has suprarenal filter ) No RLE, scheduled for repeat thrombectomy this week -DVT/anticoagulation: Continue Eliquis-  5/7- per IR< also needs SCDs at all times in bed- IR went over with nursing. -antiplatelet therapy: N/A 3. Pain Management: Decrease oxycodone to q12H prn  5/8- pt denies pain- con't regimen 4. Mood: Amantadine 200 mg twice daily, may need to reduce if pt becomes agitated  -antipsychotic agents: N/A 5. Neuropsych: This patient is not capable of making decisions on his own behalf. 6. Skin/Wound Care: Routine skin checks 7. Fluids/Electrolytes/Nutrition: Routine  in and outs with follow-up chemistries 8. PAF. Status post ablation cardioversion in the Past. Continue Eliquis. Cardio exam 30 mg every 6 hours. Cardiac rate becoming Huston Foley will reduce diltiazem to 30mg   TID Come increased fluctuation of HR but overall controlled in 80-95bpm range Vitals:   11/11/20 0435 11/11/20 0509  BP: (!) 99/44 120/75  Pulse: 79 81  Resp: 18   Temp: 98 F (36.7 C)   SpO2: 97%    9. Acute hypoxic respiratory failure. Tracheostomy 10/04/2020 decannulated 11/09/20  10. Hospital-acquired pneumonia/ventilator associated pneumonia. Infectious disease follow-up antibiotic therapy completed  11. Dysphagia. Dysphagia #3 thin liquids.good intake 12. Hyperglycemia related to tube feeds. Resolved, off lantus  CBG (last 3)  Recent Labs    11/08/20 2013 11/09/20 0031  GLUCAP 122* 110*  will d/c CBG not diabetic will be off TF- CBGs nl- will d/c  13.  RIght  foot pain ? Tibial nerve irritation post procedure circulation is good, no MSK exam abnl 14. Insomnia  Reduced trazodone after discussion with Phrmacy regarding potential for bradycardia  5/10 slept better off TF  15.  Dysuria check UA LOS: 14 days A FACE TO FACE EVALUATION WAS PERFORMED  Erick Colace 11/11/2020, 7:45 AM

## 2020-11-11 NOTE — Progress Notes (Signed)
Speech Language Pathology Daily Session Note  Patient Details  Name: Walter [name] MRN: [id number] Date of Birth: 06/10/1956  Today's Date: 11/11/2020 SLP Individual Time: 2876-8115 SLP Individual Time Calculation (min): 45 min  Short Term Goals: Week 2: SLP Short Term Goal 1 (Week 2): Patient will utilize an increased vocal intensity at the phrase leve to achieve ~75% intelligibility with Mod verbal cues. SLP Short Term Goal 2 (Week 2): Patient will consume current diet with minimal overt s/s of aspiration with Mod verbal cues for use of swallowing compensatory strategies. SLP Short Term Goal 3 (Week 2): Patient will demonstrate sustained attention to functional tasks for 30 minutes with Mod verbal cues for redirection. SLP Short Term Goal 4 (Week 2): Patient will utilize external aids and contextual cues for orientation with Min multimodal cues. SLP Short Term Goal 5 (Week 2): Patient will demonstrate functional problem solving for basic and familiar tasks with Mod A multimodal cues.  Skilled Therapeutic Interventions: Pt seen for skilled ST with focus on swallowing and cognitive goals. Pt intelligibility 100% with appropriate vocal volume across session. Pt provided oral care with set up, completing independently and thoroughly including flossing.  Pt observed with Dys 2/thin breakfast meal, remains very impulsive with eating with decreased ability to self-regulate bolus size. Mod fading to min A verbal cues effective in reducing bolus size and increasing rate of cyclic ingestion (2:1). Pt with no overt s/s aspiration across any trials Dys 2, thin liquids via straw. Pt states he wants to discharge earlier than anticipated date, also mentioned this to MD. SLP providing education regarding current cognitive and physical impairments and how this will impact safety at home. Pt accepting education, however continues to demonstrate decreased awareness into deficits at this time. Pt left in bed with  alarm set and all needs within reach. Cont ST POC.   Pain Pain Assessment Pain Scale: 0-10 Pain Score: 0-No pain  Therapy/Group: Individual Therapy  Tacey Ruiz 11/11/2020, 9:56 AM

## 2020-11-11 NOTE — Progress Notes (Signed)
   11/11/20 1850  What Happened  Was fall witnessed? Yes  Who witnessed fall? Mark NT  Patients activity before fall to/from bed, chair, or stretcher  Point of contact buttocks  Was patient injured? No  Follow Up  MD notified Kirsteins  Time MD notified [phone]  Family notified Yes - comment  Time family notified 76  Progress note created (see row info) Yes  Adult Fall Risk Assessment  Risk Factor Category (scoring not indicated) Fall has occurred during this admission (document High fall risk)  Age 49  Fall History: Fall within 6 months prior to admission 5  Elimination; Bowel and/or Urine Incontinence 2  Elimination; Bowel and/or Urine Urgency/Frequency 0  Medications: includes PCA/Opiates, Anti-convulsants, Anti-hypertensives, Diuretics, Hypnotics, Laxatives, Sedatives, and Psychotropics 5  Patient Care Equipment 1  Mobility-Assistance 2  Mobility-Gait 2  Mobility-Sensory Deficit 2  Altered awareness of immediate physical environment 1  Impulsiveness 2  Lack of understanding of one's physical/cognitive limitations 4  Total Score 27  Patient Fall Risk Level High fall risk  Adult Fall Risk Interventions  Required Bundle Interventions *See Row Information* High fall risk - low, moderate, and high requirements implemented  Additional Interventions Camera surveillance (with patient/family notification & education);Lap belt while in chair/wheelchair (Rehab only);Pharmacy review of medications;PT/OT need assessed if change in mobility from baseline;Reorient/diversional activities with confused patients;Safety Sitter/Safety Rounder;Use of appropriate toileting equipment (bedpan, BSC, etc.)  Screening for Fall Injury Risk (To be completed on HIGH fall risk patients) - Assessing Need for Floor Mats  Risk For Fall Injury- Criteria for Floor Mats Previous fall this admission;TeleSitter camera in use;Bleeding risk-anticoagulation (not prophylaxis);Confusion/dementia (+NuDESC, CIWA, TBI, etc.)   Will Implement Floor Mats Yes  Pain Assessment  Pain Scale 0-10  Pain Score 0  .mec

## 2020-11-12 LAB — URINALYSIS, ROUTINE W REFLEX MICROSCOPIC
Bilirubin Urine: NEGATIVE
Glucose, UA: NEGATIVE mg/dL
Hgb urine dipstick: NEGATIVE
Ketones, ur: NEGATIVE mg/dL
Leukocytes,Ua: NEGATIVE
Nitrite: NEGATIVE
Protein, ur: NEGATIVE mg/dL
Specific Gravity, Urine: 1.014 (ref 1.005–1.030)
pH: 6 (ref 5.0–8.0)

## 2020-11-12 NOTE — Plan of Care (Signed)
  Problem: Consults Goal: RH STROKE PATIENT EDUCATION Description: See Patient Education module for education specifics  Outcome: Progressing   Problem: RH BOWEL ELIMINATION Goal: RH STG MANAGE BOWEL WITH ASSISTANCE Description: STG Manage Bowel with  mod I Assistance. Outcome: Progressing   Problem: RH BLADDER ELIMINATION Goal: RH STG MANAGE BLADDER WITH ASSISTANCE Description: STG Manage Bladder With  mod I Assistance Outcome: Progressing   Problem: RH SKIN INTEGRITY Goal: RH STG SKIN FREE OF INFECTION/BREAKDOWN Description: Min assist Outcome: Progressing Goal: RH STG MAINTAIN SKIN INTEGRITY WITH ASSISTANCE Description: STG Maintain Skin Integrity With min  Assistance. Outcome: Progressing Goal: RH STG ABLE TO PERFORM INCISION/WOUND CARE W/ASSISTANCE Description: STG Able To Perform Incision/Wound Care With min Assistance. Outcome: Progressing   Problem: RH SAFETY Goal: RH STG ADHERE TO SAFETY PRECAUTIONS W/ASSISTANCE/DEVICE Description: STG Adhere to Safety Precautions With cues/reminders Assistance/Device. Outcome: Progressing   Problem: RH KNOWLEDGE DEFICIT Goal: RH STG INCREASE KNOWLEDGE OF DIABETES Description: Patient and wife will be able to manage DM with medications and dietary modifications using handouts and educational tools independently Outcome: Progressing Goal: RH STG INCREASE KNOWLEDGE OF HYPERTENSION Description: Patient and wife will be able to manage HTN with medications and dietary modifications using handouts and educational tools independently Outcome: Progressing Goal: RH STG INCREASE KNOWLEDGE OF DYSPHAGIA/FLUID INTAKE Description: Patient and wife will be able to manage Dysphagia, medications and dietary modifications using handouts and educational tools independently Outcome: Progressing Goal: RH STG INCREASE KNOWLEGDE OF HYPERLIPIDEMIA Description: Patient and wife will be able to manage HLD with medications and dietary modifications using  handouts and educational tools independently Outcome: Progressing Goal: RH STG INCREASE KNOWLEDGE OF STROKE PROPHYLAXIS Description: Patient and wife will be able to manage Secondary stroke risks with medications and dietary modifications using handouts and educational tools independently Outcome: Progressing   

## 2020-11-12 NOTE — Progress Notes (Signed)
PROGRESS NOTE   Subjective/Complaints: Witnessed fall yesterday without injury , fell as he turned,  Pt would like to go home early , informed pt we will have another team conf this week  ROS-   Pt denies SOB, abd pain, CP, N/V/C/D, and vision changes- double vision improving   Objective:   No results found. No results for input(s): WBC, HGB, HCT, PLT in the last 72 hours. No results for input(s): NA, K, CL, CO2, GLUCOSE, BUN, CREATININE, CALCIUM in the last 72 hours.  Intake/Output Summary (Last 24 hours) at 11/12/2020 0732 Last data filed at 11/11/2020 2103 Gross per 24 hour  Intake 118 ml  Output 250 ml  Net -132 ml     Pressure Injury 10/21/20 Head Left;Posterior Stage 1 -  Intact skin with non-blanchable redness of a localized area usually over a bony prominence. Pink non-blanchable scalp with thinning hair on posterior left side of head (Active)  10/21/20 1100  Location: Head  Location Orientation: Left;Posterior  Staging: Stage 1 -  Intact skin with non-blanchable redness of a localized area usually over a bony prominence.  Wound Description (Comments): Pink non-blanchable scalp with thinning hair on posterior left side of head  Present on Admission: No    Physical Exam: Vital Signs Blood pressure 128/71, pulse 80, temperature (!) 97.5 F (36.4 C), resp. rate 14, weight 89.7 kg, SpO2 97 %.  ENT-trach site occlusivedressing   General: No acute distress Mood and affect are appropriate Heart: Regular rate and rhythm no rubs murmurs or extra sounds Lungs: Clear to auscultation, breathing unlabored, no rales or wheezes Abdomen: Positive bowel sounds, soft nontender to palpation, nondistended Extremities: No clubbing, cyanosis, or edema Skin: No evidence of breakdown, no evidence of rash   Neurologic: , motor strength is 4/5 in bilateral deltoid, bicep, tricep, grip, hip flexor, knee extensors, ankle dorsiflexor  and plantar flexor Sensory exam normal sensation to light touch and proprioception in bilateral upper and lower extremities Cerebellar exam severe dysmetria Left FNF     Musculoskeletal: Full range of motion in all 4 extremities. No joint swelling, no joint effusion R foot, No pain with ROM    Assessment/Plan: 1. Functional deficits which require 3+ hours per day of interdisciplinary therapy in a comprehensive inpatient rehab setting.  Physiatrist is providing close team supervision and 24 hour management of active medical problems listed below.  Physiatrist and rehab team continue to assess barriers to discharge/monitor patient progress toward functional and medical goals  Care Tool:  Bathing    Body parts bathed by patient: Right arm,Left arm,Chest,Face,Right upper leg,Left upper leg,Abdomen,Buttocks,Front perineal area   Body parts bathed by helper: Left lower leg,Right lower leg     Bathing assist Assist Level: Minimal Assistance - Patient > 75%     Upper Body Dressing/Undressing Upper body dressing   What is the patient wearing?: Pull over shirt    Upper body assist Assist Level: Supervision/Verbal cueing    Lower Body Dressing/Undressing Lower body dressing      What is the patient wearing?: Pants,Underwear/pull up     Lower body assist Assist for lower body dressing: Minimal Assistance - Patient > 75%  Toileting Toileting    Toileting assist Assist for toileting: Moderate Assistance - Patient 50 - 74%     Transfers Chair/bed transfer  Transfers assist  Chair/bed transfer activity did not occur: Safety/medical concerns  Chair/bed transfer assist level: Moderate Assistance - Patient 50 - 74%     Locomotion Ambulation   Ambulation assist   Ambulation activity did not occur: Safety/medical concerns  Assist level: 2 helpers Assistive device: Walker-rolling Max distance: 20'   Walk 10 feet activity   Assist  Walk 10 feet activity did not  occur: Safety/medical concerns        Walk 50 feet activity   Assist Walk 50 feet with 2 turns activity did not occur: Safety/medical concerns         Walk 150 feet activity   Assist Walk 150 feet activity did not occur: Safety/medical concerns         Walk 10 feet on uneven surface  activity   Assist Walk 10 feet on uneven surfaces activity did not occur: Safety/medical concerns         Wheelchair     Assist Will patient use wheelchair at discharge?:  (TBD)             Wheelchair 50 feet with 2 turns activity    Assist            Wheelchair 150 feet activity     Assist          Blood pressure 128/71, pulse 80, temperature (!) 97.5 F (36.4 C), resp. rate 14, weight 89.7 kg, SpO2 97 %.  Medical Problem List and Plan: 1. Right side weakness and slurred speech secondary to intracerebral hemorrhage complicated by hydrocephalus. Status post EVD placed per neurosurgery -patient may not shower -ELOS/Goals: 5/26- pt would like to go home early , discussed that he would not have the intensity of therapy at home  -con't PT and OT and SLP \\CCM - signed off after decannulation  2. Acute PE/bilateral lower extremity DVT: Status post IVC filter 10/08/2020 with thrombectomy and retrieval of IVC filter 10/28/2020 per interventional radiology (still has suprarenal filter ) No RLE, scheduled for repeat thrombectomy this week -DVT/anticoagulation: Continue Eliquis-  5/7- per IR< also needs SCDs at all times in bed- IR went over with nursing. -antiplatelet therapy: N/A 3. Pain Management: Decrease oxycodone to q12H prn  5/8- pt denies pain- con't regimen 4. Mood: Amantadine 200 mg twice daily, may need to reduce if pt becomes agitated  -antipsychotic agents: N/A 5. Neuropsych: This patient is not capable of making decisions on his own behalf. 6. Skin/Wound Care: Routine skin checks 7.  Fluids/Electrolytes/Nutrition: Routine in and outs with follow-up chemistries 8. PAF. Status post ablation cardioversion in the Past. Continue Eliquis. Cardio exam 30 mg every 6 hours. Cardiac rate becoming Huston Foley will reduce diltiazem to 30mg   TID Come increased fluctuation of HR but overall controlled in 80-95bpm range Vitals:   11/11/20 2347 11/12/20 0347  BP: 116/69 128/71  Pulse: 74 80  Resp: 16 14  Temp: (!) 97.5 F (36.4 C) (!) 97.5 F (36.4 C)  SpO2: 96% 97%   9. Acute hypoxic respiratory failure. Tracheostomy 10/04/2020 decannulated 11/09/20  10. Hospital-acquired pneumonia/ventilator associated pneumonia. Infectious disease follow-up antibiotic therapy completed  11. Dysphagia. Dysphagia #3 thin liquids.good intake 12. Hyperglycemia related to tube feeds. Resolved, off lantus  CBG (last 3)  No results for input(s): GLUCAP in the last 72 hours.will d/c CBG not diabetic will be off TF- CBGs nl-  will d/c  13.  RIght foot pain ? Tibial nerve irritation post procedure circulation is good, no MSK exam abnl 14. Insomnia  Reduced trazodone after discussion with Phrmacy regarding potential for bradycardia  5/10 slept better off TF  15.  Dysuria check UA LOS: 15 days A FACE TO FACE EVALUATION WAS PERFORMED  Erick Colace 11/12/2020, 7:32 AM

## 2020-11-13 LAB — BASIC METABOLIC PANEL
Anion gap: 7 (ref 5–15)
BUN: 5 mg/dL — ABNORMAL LOW (ref 8–23)
CO2: 28 mmol/L (ref 22–32)
Calcium: 9.4 mg/dL (ref 8.9–10.3)
Chloride: 101 mmol/L (ref 98–111)
Creatinine, Ser: 0.81 mg/dL (ref 0.61–1.24)
GFR, Estimated: >60 mL/min (ref >60)
Glucose, Bld: 113 mg/dL — ABNORMAL HIGH (ref 70–99)
Potassium: 3.9 mmol/L (ref 3.5–5.1)
Sodium: 136 mmol/L (ref 135–145)

## 2020-11-13 LAB — URINE CULTURE

## 2020-11-13 MED ORDER — OXYCODONE HCL 5 MG PO TABS: 5.0000 mg | ORAL_TABLET | Freq: Every day | ORAL | Status: DC | PRN

## 2020-11-13 NOTE — Progress Notes (Signed)
PROGRESS NOTE   Subjective/Complaints: No complaints Moving bowels regularly Denies pain Has difficulty sleeping due to noise in hallway/lights on  ROS-  Pt denies SOB, abd pain, CP, N/V/C/D, pain and vision changes- double vision improving   Objective:   No results found. No results for input(s): WBC, HGB, HCT, PLT in the last 72 hours. Recent Labs    11/13/20 0646  NA 136  K 3.9  CL 101  CO2 28  GLUCOSE 113*  BUN 5*  CREATININE 0.81  CALCIUM 9.4    Intake/Output Summary (Last 24 hours) at 11/13/2020 1252 Last data filed at 11/13/2020 0900 Gross per 24 hour  Intake 358 ml  Output 1050 ml  Net -692 ml     Pressure Injury 10/21/20 Head Left;Posterior Stage 1 -  Intact skin with non-blanchable redness of a localized area usually over a bony prominence. Pink non-blanchable scalp with thinning hair on posterior left side of head (Active)  10/21/20 1100  Location: Head  Location Orientation: Left;Posterior  Staging: Stage 1 -  Intact skin with non-blanchable redness of a localized area usually over a bony prominence.  Wound Description (Comments): Pink non-blanchable scalp with thinning hair on posterior left side of head  Present on Admission: No    Physical Exam: Vital Signs Blood pressure 129/74, pulse 91, temperature 97.9 F (36.6 C), resp. rate 16, weight 88 kg, SpO2 97 %.  ENT-trach site uncovered and healing well Gen: no distress, normal appearing HEENT: oral mucosa pink and moist, NCAT Cardio: Reg rate Chest: normal effort, normal rate of breathing Abd: soft, non-distended Ext: no edema Psych: pleasant, normal affect Skin: intact  Neurologic: , motor strength is 4/5 in bilateral deltoid, bicep, tricep, grip, hip flexor, knee extensors, ankle dorsiflexor and plantar flexor Sensory exam normal sensation to light touch and proprioception in bilateral upper and lower extremities Cerebellar exam severe  dysmetria Left FNF     Musculoskeletal: Full range of motion in all 4 extremities. No joint swelling, no joint effusion R foot, No pain with ROM    Assessment/Plan: 1. Functional deficits which require 3+ hours per day of interdisciplinary therapy in a comprehensive inpatient rehab setting.  Physiatrist is providing close team supervision and 24 hour management of active medical problems listed below.  Physiatrist and rehab team continue to assess barriers to discharge/monitor patient progress toward functional and medical goals  Care Tool:  Bathing    Body parts bathed by patient: Right arm,Left arm,Chest,Face,Right upper leg,Left upper leg,Abdomen,Buttocks,Front perineal area   Body parts bathed by helper: Left lower leg,Right lower leg     Bathing assist Assist Level: Minimal Assistance - Patient > 75%     Upper Body Dressing/Undressing Upper body dressing   What is the patient wearing?: Pull over shirt    Upper body assist Assist Level: Supervision/Verbal cueing    Lower Body Dressing/Undressing Lower body dressing      What is the patient wearing?: Pants,Underwear/pull up     Lower body assist Assist for lower body dressing: Minimal Assistance - Patient > 75%     Toileting Toileting    Toileting assist Assist for toileting: Moderate Assistance - Patient 50 - 74%  Transfers Chair/bed transfer  Transfers assist  Chair/bed transfer activity did not occur: Safety/medical concerns  Chair/bed transfer assist level: Moderate Assistance - Patient 50 - 74%     Locomotion Ambulation   Ambulation assist   Ambulation activity did not occur: Safety/medical concerns  Assist level: 2 helpers Assistive device: Walker-rolling Max distance: 20'   Walk 10 feet activity   Assist  Walk 10 feet activity did not occur: Safety/medical concerns        Walk 50 feet activity   Assist Walk 50 feet with 2 turns activity did not occur: Safety/medical  concerns         Walk 150 feet activity   Assist Walk 150 feet activity did not occur: Safety/medical concerns         Walk 10 feet on uneven surface  activity   Assist Walk 10 feet on uneven surfaces activity did not occur: Safety/medical concerns         Wheelchair     Assist Will patient use wheelchair at discharge?:  (TBD)             Wheelchair 50 feet with 2 turns activity    Assist            Wheelchair 150 feet activity     Assist          Blood pressure 129/74, pulse 91, temperature 97.9 F (36.6 C), resp. rate 16, weight 88 kg, SpO2 97 %.  Medical Problem List and Plan: 1. Right side weakness and slurred speech secondary to intracerebral hemorrhage complicated by hydrocephalus. Status post EVD placed per neurosurgery -patient may not shower -ELOS/Goals: 5/26- pt would like to go home early , discussed that he would not have the intensity of therapy at home  -Continue PT and OT and SLP \\CCM - signed off after decannulation  2. Acute PE/bilateral lower extremity DVT: Status post IVC filter 10/08/2020 with thrombectomy and retrieval of IVC filter 10/28/2020 per interventional radiology (still has suprarenal filter ) No RLE, scheduled for repeat thrombectomy this week -DVT/anticoagulation: Continue Eliquis-  5/7- per IR< also needs SCDs at all times in bed- IR went over with nursing. -antiplatelet therapy: N/A 3. Pain Management: Decrease oxycodone to daily prn  5/16- pt denies pain- con't regimen 4. Mood: Amantadine 200 mg twice daily, may need to reduce if pt becomes agitated  -antipsychotic agents: N/A 5. Neuropsych: This patient is not capable of making decisions on his own behalf. 6. Skin/Wound Care: Routine skin checks 7. Fluids/Electrolytes/Nutrition: Routine in and outs with follow-up chemistries 8. PAF. Status post ablation cardioversion in the Past. Continue  Eliquis. Cardio exam 30 mg every 6 hours. Cardiac rate becoming Huston Foley will reduce diltiazem to 30mg   TID Come increased fluctuation of HR but overall controlled in 80-95bpm range Vitals:   11/13/20 0314 11/13/20 0717  BP: 111/79 129/74  Pulse: 91   Resp: 16   Temp: 97.9 F (36.6 C)   SpO2: 97%    9. Acute hypoxic respiratory failure. Tracheostomy 10/04/2020 decannulated 11/09/20  10. Hospital-acquired pneumonia/ventilator associated pneumonia. Infectious disease follow-up antibiotic therapy completed  11. Dysphagia. Continue Dysphagia #3 thin liquids.good intake 12. Hyperglycemia related to tube feeds. Resolved, off lantus  CBG (last 3)  No results for input(s): GLUCAP in the last 72 hours.will d/c CBG not diabetic will be off TF- CBGs nl- will d/c  13.  RIght foot pain ? Tibial nerve irritation post procedure circulation is good, no MSK exam abnl 14. Insomnia  Reduced trazodone  after discussion with Phrmacy regarding potential for bradycardia  5/10 slept better off TF  15.  Dysuria: UA negative, UC with multiple species LOS: 16 days A FACE TO FACE EVALUATION WAS PERFORMED  Walter [name] 11/13/2020, 12:52 PM

## 2020-11-13 NOTE — Progress Notes (Signed)
Nutrition Follow-up  DOCUMENTATION CODES:   Non-severe (moderate) malnutrition in context of acute illness/injury  INTERVENTION:  Continue Ensure Enlive po TID, each supplement provides 350 kcal and 20 grams of protein.  Continue Magic cup BID with meals, each supplement provides 290 kcal and 9 grams of protein.  Encourage PO intake.   NUTRITION DIAGNOSIS:   Moderate Malnutrition related to acute illness (intraparencymal hemorrhage) as evidenced by mild fat depletion,moderate muscle depletion,percent weight loss (7.6% weight loss in less than 3 months); ongoing  GOAL:   Patient will meet greater than or equal to 90% of their needs; progressed  MONITOR:   PO intake,Supplement acceptance,Diet advancement,Skin,Weight trends,Labs,I & O's  REASON FOR ASSESSMENT:   Consult Enteral/tube feeding initiation and management  ASSESSMENT:   65 year old male with PMH of atrial fibrillation with ablation. Presented 09/17/20 with right-sided weakness, headache, dysarthria, and vomiting. Cranial CT/MRI showed acute intraparenchymal hemorrhage. Pt required emergent intubation for airway protection. EVD was placed per neurosurgery. Pt underwent tracheostomy 10/04/20. Pt initially NPO and is now on a dysphagia 3 diet with thin liquids. Cortrak tube in place for nutrition support. Noted target d/c date of 11/23/20.  5/05 - s/p right lower extremity venogram, intravascular ultrasound, right cavo-ilio-femoral venous mechanical thrombectomy, right femoral and common femoral balloon angioplasty 5/12 - trach decannulated  Meal completion has been 75-100%. Pt has been tolerating his PO intake. Pt currently has Ensure ordered and has been consuming them. RD to continue with current orders to aid in caloric and protein needs. Labs and medications reviewed.   Diet Order:   Diet Order            DIET DYS 3 Room service appropriate? Yes; Fluid consistency: Thin  Diet effective 1000           DIET DYS 2  Room service appropriate? Yes; Fluid consistency: Thin  Diet effective now                 EDUCATION NEEDS:   No education needs have been identified at this time  Skin:  Skin Assessment: Reviewed RN Assessment Skin Integrity Issues:: Other (Comment),Incisions,Stage I Stage I: left head Incisions: neck Other: MASD rectum, puncture to left groin, right knee, right neck  Last BM:  5/14  Height:   Ht Readings from Last 1 Encounters:  09/25/20 6\' 2"  (1.88 m)    Weight:   Wt Readings from Last 1 Encounters:  11/13/20 88 kg   BMI:  Body mass index is 24.91 kg/m.  Estimated Nutritional Needs:   Kcal:  2300-2500  Protein:  115-130 grams  Fluid:  > 2.0 L  11/15/20, MS, RD, LDN RD pager number/after hours weekend pager number on Amion.

## 2020-11-13 NOTE — Progress Notes (Signed)
Occupational Therapy Session Note  Patient Details  Name: Walter [name] MRN: [id number] Date of Birth: 1955/11/05  Today's Date: 11/13/2020 OT Individual Time: 1003-1100 OT Individual Time Calculation (min): 57 min    Short Term Goals: Week 1:  OT Short Term Goal 1 (Week 1): Pt will complete BSC or toilet transfer with 1 assist and LRAD OT Short Term Goal 1 - Progress (Week 1): Not met OT Short Term Goal 2 (Week 1): Pt will engage in self care or therapeutic activity while sitting unsupported for ~20 minutes with no more than Mod balance assistance OT Short Term Goal 2 - Progress (Week 1): Met OT Short Term Goal 3 (Week 1): Pt will be able to visually scan for 1 ADL item and successfully reach for it with no more than Mod A OT Short Term Goal 3 - Progress (Week 1): Met Week 2:  OT Short Term Goal 1 (Week 2): Pt will complete STS in prep for standing ADL with mod A of 1. OT Short Term Goal 2 (Week 2): Pt will complete seated grooming with min A for task completion. OT Short Term Goal 3 (Week 2): Pt will complete BSC or toilet transfer with 1 assist and LRAD  Skilled Therapeutic Interventions/Progress Updates:    Pt received in w/c in room and consented to OT tx. Session focused on BUE coordination, standing and sitting balance, and trunk control. Second person standing by for safety as pt tends to be impulsive, but if you explain to him first that you are only explaining the plan and not instructing him yet, he does pretty well. Pt instructed to stand and match cards while standing with RW with extensive time for activity. Pt completed STS with CGA, min A to steady once standing in RW. Pt demo's good strength to stand, however is very unsteady and requires CGA-min A to maintain balance while standng in RW. Pt req 2 seated rest breaks during activity. Pt instructed in unsupported sitting activity with dynamic reaching to pick up items off floor to increase core strength and trunk control for  ADLs with stand by assist. Instructed to finish card matching while seated in w/c, placed targets farther in front of pt to facilitate fwd sitting and functional reach for increased core strength and BUE coordination. After tx, pt helped back to room and left with all needs met and chair alarm on.    Therapy Documentation Precautions:  Precautions Precautions: Fall Precaution Comments: NG tube, trach, PMSV, R hemiparesis, hx of DVTs and PEs Other Brace: BUE resting hand splints, see schedule Restrictions Weight Bearing Restrictions: No    Pain: none     Therapy/Group: Individual Therapy  Dreya Buhrman 11/13/2020, 12:16 PM

## 2020-11-13 NOTE — Progress Notes (Signed)
Physical Therapy Session Note  Patient Details  Name: Walter [name] MRN: [id number] Date of Birth: [birth year]  Today's Date: 11/13/2020 PT Individual Time: 1130-1200 and 1415-1454 PT Individual Time Calculation (min): 30 min and 39 min  Short Term Goals: Week 2:  PT Short Term Goal 1 (Week 2): Pt will consistently perform supine<>sit with supervision. PT Short Term Goal 2 (Week 2): Pt will perform sit<>stands using LRAD with CGA. PT Short Term Goal 3 (Week 2): Pt will perform SPVT transfers using LRAD with Min A. PT Short Term Goal 4 (Week 2): Pt will ambulate at least 74ft using LRAD with +2 Min A with wide BOS.  Skilled Therapeutic Interventions/Progress Updates:  Session 1:  Patient seated upright in standard w/c on entrance to room. Patient alert and agreeable to PT session. Patient denied pain throughout session.  Therapeutic Activity: Transfers: Focus this session on ambulatory car transfer with 10 feet approach to car. Pt verbally relates safe sequencing of car transfer prior to performance. STS w/c to RW with impulsive launch requires Min A +2 to manage balance and Mod A to manage walker as well as pt's balance and ataxic stepping to approach car. Initiates turn at appropriate time, lowers to sit to car seat slightly early but with good UE support and Min A of one person. Is able to complete pivot turn in seat and bring BLE into footwell of car and simulate donning seatbelt with supervision. Turns back to exit seat with supervision. Prior to rise to stand, pt given vc to pause and wait for physical assist for balance upon standing. With physical assist of one in place, pt performs STS to RW with Min A and vc for foot placement, forward lean and slow push from seat with one hand on RW for slow rise to stand in order to attain good standing balance. Min A of one person to attain standing balance. Then Min A +2 to ambulate 10 ft back to w/c, perform pivot turn and lower to seat.   During  gait training, focus spent on specific verbal cueing in order to set a routine for safe STS performance. Pt provided with vc to scoot toward edge of seat. Then to widen foot positioning and bring feet back under seat. Then bring LUE to RW handle and then position R hand to armrest of seat. Forward lean prior to rise to stand and slow progression toward standing instead of launching to stand. Improvement noted after variable practice - to be performed with blocked practice in next session.   Gait Training:  Patient ambulated 110' x1 using RW with Mod A +2. Demonstrated WBOS initially which narrows with fatigue and time in ambulation requring heavy vc and some tc to correct. Pt very impulsive and ataxic stepping with LLE and ataxic trunk movements. LLE also noted to take very long steps. 4# aw added to LLE for 2nd amb bout. Pt asked prior to bout what weight will remind him to do and response is to " slow down". Educated that he is correct and to also think to take smaller steps. 2nd bout 38' x1 with Min A +2 initially and then Mod A +2 for fatigue for final 20' of bout. VC throughout for shorter step on LLE, hip forward with L stance phase, maintaining BUE downward pressure into RW to maintain forward balance over toes, and maintaining WBOS. Improved quality of gait 2nd bout over 1st.   Patient seated upright in w/c at end of session with brakes  locked, belt alarm set, and all needs within reach.  2nd session:  Patient supine in bed napping on entrance to room. Patient easily roused, then alert and agreeable to PT session. Patient denied pain during session.  Therapeutic Activity: Bed Mobility: Patient performed supine --> sit with supervision. Pt states desire for hospital bed. Related to pt that he does not require hospital bed specifically but could benefit from either sleep number or Tempur-Pedic style bed where head and foot of bed can elevate for comfort. Transfers: Patient performed STS and SPVT  transfers overall with Min A for balance once reaching upright stance. Focus this session on performance with dialing in NMR.  Neuromuscular Re-ed: NMR facilitated during session with focus on standing balance and improved stability during performance of STS transfers and to decrease forward stepping with LLE during gait. Pt guided in specific sequencing of setup for sit--> stand starting with forward scoot to edge of seat, then placing feet apart, then pulled back under seat, then hands placed on thighs, followed by forward lean of shoulders over feet with slow rise to stand. During standing, pt given verbal cues for visualization of plumb line from pt's center of mass into center of placement of feet. With shift in weight forward and back, related change in position of plumb line in relation to pt's feeling of balance. Pt states understanding and is able to demonstrate improved maintenance of balance over feet with reduced assist required to maintain balance with focus on this task. Balance breaks down with addition of another task.   Pt also guided in forward stepping only with LLE only to distance of front wheel of walker and then stepping back to start position. Step task performed in order to promote shorter steps during ambulation. Pt states understanding of reasoning for task. initially steps forward reciprocating steps but is able to adjust over time to only step forward and back with LLE after numerous vc/ tc.  NMR performed for improvements in motor control and coordination, balance, sequencing, judgement, and self confidence/ efficacy in performing all aspects of mobility at highest level of independence.   At end of session, pt relates need to toilet. Call placed for NT while pt taken to bathroom. Toilet transfer performed with vc/ tc for hand and foot placement and pt following through requiring Min A to complete. Pt stands at toilet and is able to doff shorts with Min A to pt for balance. Then  sits with CGA. Upon completion, pt stands with Min A +2 dons pants requiring 2 person assist for balance and then impulsively SPVT steps to sit to w/c with no UE support and Mod A +2.   Patient seated in w/c at end of session with brakes locked, belt alarm set, and all needs within reach.      Therapy Documentation Precautions:  Precautions Precautions: Fall Precaution Comments: NG tube, trach, PMSV, R hemiparesis, hx of DVTs and PEs Other Brace: BUE resting hand splints, see schedule Restrictions Weight Bearing Restrictions: No   Therapy/Group: Individual Therapy   Loel Dubonnet PT, DPT 11/13/2020, 12:55 PM

## 2020-11-13 NOTE — Plan of Care (Signed)
  Problem: Consults Goal: RH STROKE PATIENT EDUCATION Description: See Patient Education module for education specifics  Outcome: Progressing   Problem: RH BOWEL ELIMINATION Goal: RH STG MANAGE BOWEL WITH ASSISTANCE Description: STG Manage Bowel with  mod I Assistance. Outcome: Progressing   Problem: RH BLADDER ELIMINATION Goal: RH STG MANAGE BLADDER WITH ASSISTANCE Description: STG Manage Bladder With  mod I Assistance Outcome: Progressing   Problem: RH SKIN INTEGRITY Goal: RH STG SKIN FREE OF INFECTION/BREAKDOWN Description: Min assist Outcome: Progressing Goal: RH STG MAINTAIN SKIN INTEGRITY WITH ASSISTANCE Description: STG Maintain Skin Integrity With min  Assistance. Outcome: Progressing Goal: RH STG ABLE TO PERFORM INCISION/WOUND CARE W/ASSISTANCE Description: STG Able To Perform Incision/Wound Care With min Assistance. Outcome: Progressing   Problem: RH SAFETY Goal: RH STG ADHERE TO SAFETY PRECAUTIONS W/ASSISTANCE/DEVICE Description: STG Adhere to Safety Precautions With cues/reminders Assistance/Device. Outcome: Progressing   Problem: RH KNOWLEDGE DEFICIT Goal: RH STG INCREASE KNOWLEDGE OF DIABETES Description: Patient and wife will be able to manage DM with medications and dietary modifications using handouts and educational tools independently Outcome: Progressing Goal: RH STG INCREASE KNOWLEDGE OF HYPERTENSION Description: Patient and wife will be able to manage HTN with medications and dietary modifications using handouts and educational tools independently Outcome: Progressing Goal: RH STG INCREASE KNOWLEDGE OF DYSPHAGIA/FLUID INTAKE Description: Patient and wife will be able to manage Dysphagia, medications and dietary modifications using handouts and educational tools independently Outcome: Progressing Goal: RH STG INCREASE KNOWLEGDE OF HYPERLIPIDEMIA Description: Patient and wife will be able to manage HLD with medications and dietary modifications using  handouts and educational tools independently Outcome: Progressing Goal: RH STG INCREASE KNOWLEDGE OF STROKE PROPHYLAXIS Description: Patient and wife will be able to manage Secondary stroke risks with medications and dietary modifications using handouts and educational tools independently Outcome: Progressing

## 2020-11-13 NOTE — Progress Notes (Signed)
Speech Language Pathology Weekly Progress and Session Note  Patient Details  Name: Walter [name] MRN: [id number] Date of Birth: [birth year]  Beginning of progress report period: Nov 06, 2020 End of progress report period: Nov 13, 2020  Today's Date: 11/13/2020 SLP Individual Time: 0730-0825 SLP Individual Time Calculation (min): 55 min  Short Term Goals: Week 2: SLP Short Term Goal 1 (Week 2): Patient will utilize an increased vocal intensity at the phrase leve to achieve ~75% intelligibility with Mod verbal cues. SLP Short Term Goal 1 - Progress (Week 2): Met SLP Short Term Goal 2 (Week 2): Patient will consume current diet with minimal overt s/s of aspiration with Mod verbal cues for use of swallowing compensatory strategies. SLP Short Term Goal 2 - Progress (Week 2): Met SLP Short Term Goal 3 (Week 2): Patient will demonstrate sustained attention to functional tasks for 30 minutes with Mod verbal cues for redirection. SLP Short Term Goal 3 - Progress (Week 2): Met SLP Short Term Goal 4 (Week 2): Patient will utilize external aids and contextual cues for orientation with Min multimodal cues. SLP Short Term Goal 4 - Progress (Week 2): Met SLP Short Term Goal 5 (Week 2): Patient will demonstrate functional problem solving for basic and familiar tasks with Mod A multimodal cues. SLP Short Term Goal 5 - Progress (Week 2): Met    New Short Term Goals: Week 3: SLP Short Term Goal 1 (Week 3): STGs=LTGs due to ELOS  Weekly Progress Updates: Patient continues to make functional gains and has met 5 of 5 STGs this reporting period. Currently, patient is consuming Dys. 2 textures with thin liquids with minimal overt s/s of aspiration and Mod verbal cues for use of swallowing compensatory strategies.  Patient also requires overall Mod A multimodal cues to complete functional and familiar tasks safely in regards to problem solving, recall, and attention. Patient has been decannulated and is utilizing  and increased vocal intensity with Mod verbal cues to achieve improved intelligibility. Patient and family education ongoing. Patient would benefit from continued skilled SLP intervention to maximize his cognitive, swallowing and speech function prior to discharge.      Intensity: Minumum of 1-2 x/day, 30 to 90 minutes Frequency: 3 to 5 out of 7 days Duration/Length of Stay: 11/23/20 Treatment/Interventions: Cognitive remediation/compensation;Dysphagia/aspiration precaution training;Cueing hierarchy;Environmental controls;Functional tasks;Patient/family education;Therapeutic Activities;Internal/external aids;Speech/Language facilitation   Daily Session  Skilled Therapeutic Interventions:  Skilled treatment session focused on dysphagia and cognitive goals. SLP facilitated session by providing Mod A verbal cues for use of swallowing compensatory strategies with breakfast meal of Dys. 2 textures with thin liquids. Patient consumed meal with overt cough X 1, suspect due to talking with a full oral cavity. Patient also participated in trials of Dys. 3 textures. Patient demonstrated efficient mastication without overt s/s of aspiration. Recommend patient continue current diet with trial tray of Dys. 3 textures during next session. Patient was transferred to the wheelchair and required Max verbal cues for need of TEDs when OOB. Mod verbal cues were also needed for safety with transfer due to impulsivity. Patient completed basic self-care tasks at the sink with extra time and requested to use the urinal. Patient was continent of urine but required Mod verbal cues for problem solving with task. Patient left upright in wheelchair with alarm on and all needs within reach. Continue with current plan of care.       Pain No/Denies Pain   Therapy/Group: Individual Therapy  Bretta Fees 11/13/2020, 6:28 AM

## 2020-11-14 NOTE — Progress Notes (Signed)
     S/p Right lower extremity venogram,IVCgram and mechanicalthrombectomy ofinferior vena cava, right internal and external iliac, common femoral, and femoral veins, balloon angioplasty of the right femoral and common femoral veinson 5/5with Dr. Elby Showers  Pt is in Gym now in Rehab Doing really well Working well per PT  Does have compression stockings on now He says he wears them all the time   IR signing off Plan for follow up with IR as OP He will hear from scheduler for follow up with Dr Elby Showers

## 2020-11-14 NOTE — Progress Notes (Signed)
Physical Therapy Weekly Progress Note  Patient Details  Name: Walter [name] MRN: [id number] Date of Birth: 15-Feb-1956  Beginning of progress report period: Nov 06, 2020 End of progress report period: Nov 14, 2020  Today's Date: 11/14/2020 PT Individual Time: 4270-6237 PT Individual Time Calculation (min): 46 min   Patient has met 3 of 4 short term goals.  He continues to make functional improvements in quality of movement and sitting/ standing balance, however deficits remain in areas of coordination, motor control, balance, activity tolerance, cognition, and safety awareness. Focused training in NMR have improved pt's standing balance and coordination which have improved his ability to perform STS transfers. Will require continued focus on these areas in order to progress toward meeting LTGs and to optimize safe mobility in order to decrease LOA required from caregivers upon d/c.  Patient continues to demonstrate the following deficits muscle weakness, decreased cardiorespiratoy endurance, impaired timing and sequencing, unbalanced muscle activation, ataxia, decreased coordination and decreased motor planning, decreased visual perceptual skills and diplopia, decreased motor planning, decreased problem solving, decreased safety awareness and delayed processing and decreased standing balance, decreased postural control and decreased balance strategies and therefore will continue to benefit from skilled PT intervention to increase functional independence with mobility.  Patient progressing toward long term goals..  Continue plan of care.  PT Short Term Goals Week 3:  PT Short Term Goal 1 (Week 3): Pt will perform SPVT transfers using LRAD with consistent Min A. PT Short Term Goal 2 (Week 3): Pt will ambulate at least 39f using LRAD with +2 Min A with wide BOS. PT Short Term Goal 3 (Week 3): pt will demonstrate dynamic standing balance with Min A/ CGA. PT Short Term Goal 4 (Week 3): Pt will  propel w/c 150 feet with straight path and navigate turns with CGA.  Skilled Therapeutic Interventions/Progress Updates:  Patient seated in w/c on entrance to room. Patient alert and agreeable to PT session. Patient denied pain during session.  Therapeutic Activity: Transfers: Patient performed STS transfers using established cues with supervision and ability to attain and maintain standing balance. SPVt transfers require instructions for technique prior to performance in order for pt to perform with min A. Squat pivot performed with vc/ tc with Min A. Provided verbal cues for hand placement/ progression and technique.  Gait Training:  Patient ambulated 10' x2 using RW with Min A. One 180 degree turn performed with pt requiring Mod A to complete safely.  Discussion prior to performance re: conscious movement pattern required. Provided vc/ tc throughout for wide BOS, decreased L step length, upright posture, improved proximity to walker.  Wheelchair Mobility:  Patient propelled wheelchair 100 feet with supervision and intermittent Min A with vc for maintaining path, maneuvering through doorways and around obstacles.  Neuromuscular Re-ed: NMR facilitated during session with focus on standing balance and improving motor control during performance of STS. Pt guided in STS with no AD and no UE support with supervision to upright stance and with controlled descent to sit. NMR performed for improvements in motor control and coordination, balance, sequencing, judgement, and self confidence/ efficacy in performing all aspects of mobility at highest level of independence.   Patient supine in bed at end of session with brakes locked, bed alarm set, and all needs within reach.   Therapy Documentation Precautions:  Precautions Precautions: Fall Precaution Comments: NG tube, trach, PMSV, R hemiparesis, hx of DVTs and PEs Other Brace: BUE resting hand splints, see schedule Restrictions Weight Bearing  Restrictions:  No  Therapy/Group: Individual Therapy  Alger Simons PT, DPT 11/14/2020, 7:09 PM

## 2020-11-14 NOTE — Progress Notes (Signed)
Occupational Therapy Session Note  Patient Details  Name: Walter [name] MRN: [id number] Date of Birth: [birth year]  Today's Date: 11/14/2020 OT Individual Time: 4098-1191 OT Individual Time Calculation (min): 65 min + 53 min   Short Term Goals: Week 2:  OT Short Term Goal 1 (Week 2): Pt will complete STS in prep for standing ADL with mod A of 1. OT Short Term Goal 2 (Week 2): Pt will complete seated grooming with min A for task completion. OT Short Term Goal 3 (Week 2): Pt will complete BSC or toilet transfer with 1 assist and LRAD  Skilled Therapeutic Interventions/Progress Updates:     Session 1: Pt received semi-reclined in bed, agreeable to therapy. Session focus on BUE coordination and dynamic standing balance in prep for improved ADL performance. Donned B teds with max A, pt able to assist by pulling them up over knees. Doffed underwear close S at bed level via bridging. Donned new underwear/pants with min A to thread over BLE, able to bridge to pull over B hips. Came to sitting EOB with min A for balance + use of bed rail. Donned B shoes with mod A to don L shoe, max A to tie. Self-propelled w/c to and from gym with min A to navigate doorways and obstacles. Stood at [employer] x3 to complete single target game with overall good accuracy to locate targets. Req increased in target size and 2 lb wrist weights to decrease ataxic movements/increase accuracy. Min A + 2 for balance. Cont to demonstrate increased ataxic movements in LUE > RUE, but able to isolate digits this date when instructed, but difficulty incorporating isolated movements functionally. Stand-pivot throughout session with RW with min A +2 for balance + mod VCs to slow down movements. Noted improvement in safety awareness as he related "it is more difficult to walk by myself than anticipated," additionally noted to give therapist notice prior to moving. In standing, retrieved/placed pegs from knee height placed outside BOS. Noted  improved in R functional grasp and dynamic standing balance with consistent CGA - min A for balance. Stand-pivot back to bed min A no AD. Eye patch placed throughout session, reports improved diplopia without patch at a distance, but cont to be significant when looking at objects up close. Additionally, reports novel R wrist pain. Instructed to cease theraputty squeezes with R hand at this time to see if pain improves.  Pt left semi-reclined in bed with bed alarm engaged, call bell in reach, and all immediate needs met.    Session 2: Pt received semi-reclined in bed, agreeable to therapy, denies pain. Session focus on BUE coordination, dynamic standing balance, and functional cognition. Stand-pivot to w/c with RW with min A + 2 for safety/balance. Self-propelled w/c to and from gym with min A for obstacle/doorway navigation and mod VCs for BUE technique. Pt tends to only propel with RUE. Standing, played 1 game of giant Connect 4 using BUE to retrieve pieces from knee height. Cont to demonstrate moderate to severe ataxic movements in LUE, but noted moderate improved with 4 lb wrist weight placed. Pt noted to quickly understand game play and accurately follow rules. Provided beads in theraputty and instructed pt to practice removing beads for improved dexterity. Cont to req early DC, discussed pt's current performance and rehab potential and recommendation to remain in rehab until originally anticipated date. Stand-pivot back to bed with min A for balance, returned to supine and doffed B shoes close S. Pt left semi-reclined in bed with  bed alarm engaged, call bell in reach, and all immediate needs met.   Therapy Documentation Precautions:  Precautions Precautions: Fall Precaution Comments: NG tube, trach, PMSV, R hemiparesis, hx of DVTs and PEs Other Brace: BUE resting hand splints, see schedule Restrictions Weight Bearing Restrictions: No Pain:   see session notes ADL: See Care Tool for more  details.   Therapy/Group: Individual Therapy  [name] MS, OTR/L  11/14/2020, 6:39 AM

## 2020-11-14 NOTE — Progress Notes (Signed)
PROGRESS NOTE   Subjective/Complaints:  Pt surprised by how much difficulty he is having with walking, Eating breakfast without coughing  ROS-  Pt denies SOB, abd pain, CP, N/V/C/D, pain and vision changes- double vision improving - notices mainly with near vision   Objective:   No results found. No results for input(s): WBC, HGB, HCT, PLT in the last 72 hours. Recent Labs    11/13/20 0646  NA 136  K 3.9  CL 101  CO2 28  GLUCOSE 113*  BUN 5*  CREATININE 0.81  CALCIUM 9.4    Intake/Output Summary (Last 24 hours) at 11/14/2020 0744 Last data filed at 11/14/2020 5053 Gross per 24 hour  Intake 840 ml  Output 900 ml  Net -60 ml     Pressure Injury 10/21/20 Head Left;Posterior Stage 1 -  Intact skin with non-blanchable redness of a localized area usually over a bony prominence. Pink non-blanchable scalp with thinning hair on posterior left side of head (Active)  10/21/20 1100  Location: Head  Location Orientation: Left;Posterior  Staging: Stage 1 -  Intact skin with non-blanchable redness of a localized area usually over a bony prominence.  Wound Description (Comments): Pink non-blanchable scalp with thinning hair on posterior left side of head  Present on Admission: No    Physical Exam: Vital Signs Blood pressure 130/74, pulse 91, temperature 98 F (36.7 C), temperature source Oral, resp. rate 18, weight 90.1 kg, SpO2 96 %.  ENT-trach site uncovered and healing well  General: No acute distress Mood and affect are appropriate Heart: Regular rate and rhythm no rubs murmurs or extra sounds Lungs: Clear to auscultation, breathing unlabored, no rales or wheezes Abdomen: Positive bowel sounds, soft nontender to palpation, nondistended Extremities: No clubbing, cyanosis, or edema Skin: No evidence of breakdown, no evidence of rash  Neurologic: , motor strength is 4/5 in bilateral deltoid, bicep, tricep, grip, hip  flexor, knee extensors, ankle dorsiflexor and plantar flexor Sensory exam normal sensation to light touch and proprioception in bilateral upper and lower extremities Cerebellar exam severe dysmetria Left FNF     Musculoskeletal: Full range of motion in all 4 extremities. No joint swelling, no joint effusion R foot, No pain with ROM    Assessment/Plan: 1. Functional deficits which require 3+ hours per day of interdisciplinary therapy in a comprehensive inpatient rehab setting.  Physiatrist is providing close team supervision and 24 hour management of active medical problems listed below.  Physiatrist and rehab team continue to assess barriers to discharge/monitor patient progress toward functional and medical goals  Care Tool:  Bathing    Body parts bathed by patient: Right arm,Left arm,Chest,Face,Right upper leg,Left upper leg,Abdomen,Buttocks,Front perineal area   Body parts bathed by helper: Left lower leg,Right lower leg     Bathing assist Assist Level: Minimal Assistance - Patient > 75%     Upper Body Dressing/Undressing Upper body dressing   What is the patient wearing?: Pull over shirt    Upper body assist Assist Level: Supervision/Verbal cueing    Lower Body Dressing/Undressing Lower body dressing      What is the patient wearing?: Pants,Underwear/pull up     Lower body assist Assist for  lower body dressing: Minimal Assistance - Patient > 75%     Toileting Toileting    Toileting assist Assist for toileting: Moderate Assistance - Patient 50 - 74%     Transfers Chair/bed transfer  Transfers assist  Chair/bed transfer activity did not occur: Safety/medical concerns  Chair/bed transfer assist level: Moderate Assistance - Patient 50 - 74%     Locomotion Ambulation   Ambulation assist   Ambulation activity did not occur: Safety/medical concerns  Assist level: 2 helpers Assistive device: Walker-rolling Max distance: 20'   Walk 10 feet  activity   Assist  Walk 10 feet activity did not occur: Safety/medical concerns        Walk 50 feet activity   Assist Walk 50 feet with 2 turns activity did not occur: Safety/medical concerns         Walk 150 feet activity   Assist Walk 150 feet activity did not occur: Safety/medical concerns         Walk 10 feet on uneven surface  activity   Assist Walk 10 feet on uneven surfaces activity did not occur: Safety/medical concerns         Wheelchair     Assist Will patient use wheelchair at discharge?:  (TBD)             Wheelchair 50 feet with 2 turns activity    Assist            Wheelchair 150 feet activity     Assist          Blood pressure 130/74, pulse 91, temperature 98 F (36.7 C), temperature source Oral, resp. rate 18, weight 90.1 kg, SpO2 96 %.  Medical Problem List and Plan: 1. Right side weakness and slurred speech secondary to intracerebral hemorrhage complicated by hydrocephalus. Status post EVD placed per neurosurgery -patient may not shower -ELOS/Goals: 5/26- pt would like to go home early , discussed that he would not have the intensity of therapy at home  -Continue PT and OT and SLP- team conf in am  [facility]- signed off after decannulation  2. Acute PE/bilateral lower extremity DVT: Status post IVC filter 10/08/2020 with thrombectomy and retrieval of IVC filter 10/28/2020 per interventional radiology (still has suprarenal filter ) No RLE, scheduled for repeat thrombectomy this week -DVT/anticoagulation: Continue Eliquis-  5/7- per IR< also needs SCDs at all times in bed- IR went over with nursing. -antiplatelet therapy: N/A 3. Pain Management: Decrease oxycodone to daily prn  5/16- pt denies pain- con't regimen 4. Mood: Amantadine 200 mg twice daily, may need to reduce if pt becomes agitated  -antipsychotic agents: N/A 5. Neuropsych: This patient is not  capable of making decisions on his own behalf. 6. Skin/Wound Care: Routine skin checks 7. Fluids/Electrolytes/Nutrition: Routine in and outs with follow-up chemistries 8. PAF. Status post ablation cardioversion in the Past. Continue Eliquis. Cardio exam 30 mg every 6 hours. Cardiac rate becoming Huston Foley will reduce diltiazem to 30mg   TID Come increased fluctuation of HR but overall controlled in 80-95bpm range Vitals:   11/13/20 1931 11/14/20 0349  BP: (!) 159/83 130/74  Pulse: (!) 102 91  Resp: 18 18  Temp: 98.4 F (36.9 C) 98 F (36.7 C)  SpO2: 96% 96%   9. Acute hypoxic respiratory failure. Tracheostomy 10/04/2020 decannulated 11/09/20  10. Hospital-acquired pneumonia/ventilator associated pneumonia. Infectious disease follow-up antibiotic therapy completed  11. Dysphagia. Continue Dysphagia #3 thin liquids.good intake 12. Hyperglycemia related to tube feeds. Resolved, off lantus  CBG (last 3)  No results for input(s): GLUCAP in the last 72 hours.will d/c CBG not diabetic will be off TF- CBGs nl- will d/c  13.  RIght foot pain ? Tibial nerve irritation post procedure circulation is good, no MSK exam abnl 14. Insomnia  Reduced trazodone after discussion with Phrmacy regarding potential for bradycardia  5/10 slept better off TF  15.  Dysuria: UA negative, UC with multiple species LOS: 17 days A FACE TO FACE EVALUATION WAS PERFORMED  Erick Colace 11/14/2020, 7:44 AM

## 2020-11-14 NOTE — Progress Notes (Signed)
Speech Language Pathology Daily Session Note  Patient Details  Name: Walter [name] MRN: [id number] Date of Birth: [birth year]  Today's Date: 11/14/2020 SLP Individual Time: 1200-1245 SLP Individual Time Calculation (min): 45 min  Short Term Goals: Week 3: SLP Short Term Goal 1 (Week 3): STGs=LTGs due to ELOS  Skilled Therapeutic Interventions: Pt seen for skilled ST with focus on dysphagia and cognition goals. SLP facilitating Dys 3 lunch tray by providing mod A verbal cues for use of swallowing compensatory strategies, primarily reduced bolus size and alternating solids and liquids. Pt consumed meal with thin liquids with no overt s/s aspiration or change in vocal quality. Pt expressing disappointment with Dys 3 textures, wanting to advance to regular solids. Educated on this being the goal of therapy but pt continues to demonstrate impulsive eating habits and need for cues to utilize swallow strategies. Recommend trials of regular solids this week. Pt participating in mildly complex problem solving task related to home environment, requiring mod A verbal cues to understand and recall how current physical and cognitive impairments will impact ability to complete daily living tasks. Pt states wife is looking into 24/7 caregiver assist at home. Pt left in bed with alarm set and all needs within reach. Cont ST POC.  Pain Pain Assessment Pain Scale: 0-10 Pain Score: 0-No pain  Therapy/Group: Individual Therapy  Tacey Ruiz 11/14/2020, 12:26 PM

## 2020-11-15 ENCOUNTER — Other Ambulatory Visit (HOSPITAL_COMMUNITY): Payer: Self-pay

## 2020-11-15 MED ORDER — AMANTADINE HCL 100 MG PO CAPS
100.0000 mg | ORAL_CAPSULE | Freq: Two times a day (BID) | ORAL | Status: DC
  Administered 2020-11-15 – 2020-11-19 (×9): 100 mg via ORAL
  Filled 2020-11-15 (×9): qty 1

## 2020-11-15 NOTE — Progress Notes (Signed)
Patient ID: Walter [name], male   DOB: 01-May-1956, 65 y.o.   MRN: [id number]   SW met wit pt, called spouse-no answer, left detailed VM with some updates. Pt pleased with 15/7 change. No additional questions or concerns sw will follow up with pt spouse to inform her of recommendations.  [place], [place]

## 2020-11-15 NOTE — Progress Notes (Signed)
PROGRESS NOTE   Subjective/Complaints:  No longer needing oxycodone, appreciate IR note  ROS-  Pt denies SOB, abd pain, CP, N/V/C/D, pain and vision changes- double vision improving - notices mainly with near vision   Objective:   No results found. No results for input(s): WBC, HGB, HCT, PLT in the last 72 hours. Recent Labs    11/13/20 0646  NA 136  K 3.9  CL 101  CO2 28  GLUCOSE 113*  BUN 5*  CREATININE 0.81  CALCIUM 9.4    Intake/Output Summary (Last 24 hours) at 11/15/2020 0810 Last data filed at 11/15/2020 0433 Gross per 24 hour  Intake 480 ml  Output 500 ml  Net -20 ml     Pressure Injury 10/21/20 Head Left;Posterior Stage 1 -  Intact skin with non-blanchable redness of a localized area usually over a bony prominence. Pink non-blanchable scalp with thinning hair on posterior left side of head (Active)  10/21/20 1100  Location: Head  Location Orientation: Left;Posterior  Staging: Stage 1 -  Intact skin with non-blanchable redness of a localized area usually over a bony prominence.  Wound Description (Comments): Pink non-blanchable scalp with thinning hair on posterior left side of head  Present on Admission: No    Physical Exam: Vital Signs Blood pressure 132/87, pulse 82, temperature 97.8 F (36.6 C), temperature source Oral, resp. rate 16, weight 89.8 kg, SpO2 96 %.  ENT-trach site uncovered and healing well  General: No acute distress Mood and affect are appropriate Heart: Regular rate and rhythm no rubs murmurs or extra sounds Lungs: Clear to auscultation, breathing unlabored, no rales or wheezes Abdomen: Positive bowel sounds, soft nontender to palpation, nondistended Extremities: No clubbing, cyanosis, or edema   Neurologic: , motor strength is 4/5 in bilateral deltoid, bicep, tricep, grip, hip flexor, knee extensors, ankle dorsiflexor and plantar flexor Sensory exam normal sensation to light  touch and proprioception in bilateral upper and lower extremities Cerebellar exam severe dysmetria Left FNF     Musculoskeletal: Full range of motion in all 4 extremities. No joint swelling, no joint effusion R foot, No pain with ROM    Assessment/Plan: 1. Functional deficits which require 3+ hours per day of interdisciplinary therapy in a comprehensive inpatient rehab setting.  Physiatrist is providing close team supervision and 24 hour management of active medical problems listed below.  Physiatrist and rehab team continue to assess barriers to discharge/monitor patient progress toward functional and medical goals  Care Tool:  Bathing    Body parts bathed by patient: Right arm,Left arm,Chest,Face,Right upper leg,Left upper leg,Abdomen,Buttocks,Front perineal area   Body parts bathed by helper: Left lower leg,Right lower leg     Bathing assist Assist Level: Minimal Assistance - Patient > 75%     Upper Body Dressing/Undressing Upper body dressing   What is the patient wearing?: Pull over shirt    Upper body assist Assist Level: Supervision/Verbal cueing    Lower Body Dressing/Undressing Lower body dressing      What is the patient wearing?: Pants,Underwear/pull up     Lower body assist Assist for lower body dressing: Minimal Assistance - Patient > 75%     Toileting Toileting  Toileting assist Assist for toileting: Moderate Assistance - Patient 50 - 74%     Transfers Chair/bed transfer  Transfers assist  Chair/bed transfer activity did not occur: Safety/medical concerns  Chair/bed transfer assist level: Moderate Assistance - Patient 50 - 74%     Locomotion Ambulation   Ambulation assist   Ambulation activity did not occur: Safety/medical concerns  Assist level: 2 helpers Assistive device: Walker-rolling Max distance: 20'   Walk 10 feet activity   Assist  Walk 10 feet activity did not occur: Safety/medical concerns        Walk 50 feet  activity   Assist Walk 50 feet with 2 turns activity did not occur: Safety/medical concerns         Walk 150 feet activity   Assist Walk 150 feet activity did not occur: Safety/medical concerns         Walk 10 feet on uneven surface  activity   Assist Walk 10 feet on uneven surfaces activity did not occur: Safety/medical concerns         Wheelchair     Assist Will patient use wheelchair at discharge?:  (TBD)             Wheelchair 50 feet with 2 turns activity    Assist            Wheelchair 150 feet activity     Assist          Blood pressure 132/87, pulse 82, temperature 97.8 F (36.6 C), temperature source Oral, resp. rate 16, weight 89.8 kg, SpO2 96 %.  Medical Problem List and Plan: 1. Right side weakness and slurred speech secondary to intracerebral hemorrhage complicated by hydrocephalus. Status post EVD placed per neurosurgery -patient may not shower -ELOS/Goals: 5/26- pt would like to go home early , discussed that he would not have the intensity of therapy at home  -Continue PT and OT and SLP- Team conference today please see physician documentation under team conference tab, met with team  to discuss problems,progress, and goals. Formulized individual treatment plan based on medical history, underlying problem and comorbidities.  [facility]- signed off after decannulation  2. Acute PE/bilateral lower extremity DVT: Status post IVC filter 10/08/2020 with thrombectomy and retrieval of IVC filter 10/28/2020 per interventional radiology (still has suprarenal filter ) No RLE, scheduled for repeat thrombectomy this week -DVT/anticoagulation: Continue Eliquis-  5/7- per IR< also needs SCDs at all times in bed- IR went over with nursing. -antiplatelet therapy: N/A 3. Pain Management: Decrease oxycodone to daily prn  5/16- pt denies pain- con't regimen 4. Mood: Amantadine 200 mg twice daily,  may need to reduce if pt becomes agitated  -antipsychotic agents: N/A 5. Neuropsych: This patient is not capable of making decisions on his own behalf. 6. Skin/Wound Care: Routine skin checks 7. Fluids/Electrolytes/Nutrition: Routine in and outs with follow-up chemistries 8. PAF. Status post ablation cardioversion in the Past. Continue Eliquis. Cardio exam 30 mg every 6 hours. Cardiac rate becoming Loletha Grayer will reduce diltiazem to 43m  TID Come increased fluctuation of HR but overall controlled in 80-95bpm range Vitals:   11/15/20 0439 11/15/20 0750  BP: 124/68 132/87  Pulse: 82   Resp: 16   Temp: 97.8 F (36.6 C)   SpO2: 96%    9. Acute hypoxic respiratory failure. Tracheostomy 10/04/2020 decannulated 11/09/20  10. Hospital-acquired pneumonia/ventilator associated pneumonia. Infectious disease follow-up antibiotic therapy completed  11. Dysphagia. Continue Dysphagia #3 thin liquids.good intake 12. Hyperglycemia related to tube feeds. Resolved, off  lantus  CBG (last 3)  No results for input(s): GLUCAP in the last 72 hours.will d/c CBG not diabetic will be off TF- CBGs nl- will d/c  13.  RIght foot pain ? Tibial nerve irritation post procedure circulation is good, no MSK exam abnl 14. Insomnia  Reduced trazodone after discussion with Phrmacy regarding potential for bradycardia  5/10 slept better off TF  15.  Dysuria: UA negative, UC with multiple species LOS: 18 days A FACE TO [place] E Linsi Humann 11/15/2020, 8:10 AM

## 2020-11-15 NOTE — Progress Notes (Signed)
Speech Language Pathology Daily Session Note  Patient Details  Name: Walter [name] MRN: [id number] Date of Birth: Mar 28, 1956  Today's Date: 11/15/2020 SLP Individual Time: 1400-1500 SLP Individual Time Calculation (min): 60 min  Short Term Goals: Week 3: SLP Short Term Goal 1 (Week 3): STGs=LTGs due to ELOS  Skilled Therapeutic Interventions: Pt seen for skilled ST with focus on swallowing and cognition goals. SLP facilitating regular snack trial (trail mix with various nuts and candies) as well as mixed consistency (peaches in juice) by providing min A verbal cues to utilize safe swallow strategies. Pt with no overt s/s aspiration across any trials, did very well masticating hard crunchy solids. Educated pt on plan to trial regular meal tray during next treatment session with upgrade when appropriate, pt excited about progress. SLP facilitating memory task for 3-4 sentence paragraph, able to recall story elements with 50% accuracy independent, min A cues increased to 90% accuracy. Pt with decreased error awareness during task. Pt left in bed with alarm set and all needs within reach. Cont ST POC.   Pain Pain Assessment Pain Scale: 0-10 Pain Score: 0-No pain  Therapy/Group: Individual Therapy  Tacey Ruiz 11/15/2020, 3:01 PM

## 2020-11-15 NOTE — Patient Care Conference (Signed)
Inpatient RehabilitationTeam Conference and Plan of Care Update Date: 11/15/2020   Time: 10:13 AM    Patient Name: Walter [name]      Medical Record Number: [id number]  Date of Birth: 1956/03/20 Sex: Male         Room/Bed: 4M05C/4M05C-01 Payor Info: Payor: MEDICARE / Plan: MEDICARE PART A AND B / Product Type: *No Product type* /    Admit Date/Time:  10/28/2020  5:33 PM  Primary Diagnosis:  ICH (intracerebral hemorrhage) [facility])  Hospital Problems: Principal Problem:   ICH (intracerebral hemorrhage) (HCC) Active Problems:   Tracheostomy care [facility])   Malnutrition of moderate degree    Expected Discharge Date: Expected Discharge Date: 11/23/20  Team Members Present: Physician leading conference: Dr. Claudette Laws Care Coodinator Present: Chana Bode, RN, BSN, CRRN;Christina Willard, BSW Nurse Present: Chana Bode, RN PT Present: Other (comment) Sharol Harness, PT) OT Present: Other (comment) Annye English, OT) SLP Present: Other (comment) Fae Pippin, SLP) PPS Coordinator present : Fae Pippin, SLP     Current Status/Progress Goal Weekly Team Focus  Bowel/Bladder   Patient has been continent of bowel and bladder. Has some incontinent episodes.  Decrease incontinent episodes.  Timed toileing Q2-3hr during day.   Swallow/Nutrition/ Hydration   Dys. 2 textures with thin liquids, Mod A  Supervision  tolerance of current diet, trials fo Dys. 3 textures, use of swallow strategies to reduce impulsivity   ADL's   S UBD, min to mod A seated grooming, min A LBD, toileting tasks, toilet transfer, cont to have limited insight into deficits/poor safety awareness but improving  S to min A (toileting, bathing); may update based on progress  NMR, static/dynamic sitting balance, functional transfers, activity tolerance, midline orientation   Mobility             Communication   Decannulated, Min A for use of an increased vocal intensity and overall intelligibility   Supervision  use of an increased vocal intensity to improve inteligibility   Safety/Cognition/ Behavioral Observations  Min-Mod A  Supervision  recal with use of strategies, mildly complex problem solving, attention and emergent awareness   Pain   No c/o pain  less than 3 out of 10  Assess Qshift and PRN   Skin   Stage 1 posterior head healed OTA. Blanchable redness to buttocks.  no new skin breakdown  Assess Qshift and PRN     Discharge Planning:  Pt to d/c home with spouse. Avaliable 24/7 2 level home, 5 steps to enter (able to maintain on main level)   Team Discussion: Trach removed and cortrak out; eating well. Amantadine weaning as alertness improved. Progress affected by poor insight and fatigue.  Patient on target to meet rehab goals: yes, currently min assist for lower body care and toileting, dressing, etc. Able to ambulate with a rolling walker and working on steps. Min assist for vocalizations and speech intelligibility with min assist goals set for discharge.  *See Care Plan and progress notes for long and short-term goals.   Revisions to Treatment Plan:  Change to 15/7 therapy schedule  SLP working on diet tolerance, increasing to D3 texture, and swallowing strategies TR order placed for downtime activity recommendations  Teaching Needs: Toileting, transfers, medications, secondary stroke risk management, etc.  Current Barriers to Discharge: Decreased caregiver support and Home enviroment access/layout  Possible Resolutions to Barriers: Family education with wife HH follow up services to transition to OP later    Medical Summary Current Status: trach removed, diplopia cont  to improve, pt becoming more aware of severe ataxia  Barriers to Discharge: Medical stability   Possible Resolutions to [employer] Focus: cont rehab, wean symmetrel   Continued Need for Acute Rehabilitation Level of Care: The patient requires daily medical management by a physician with  specialized training in physical medicine and rehabilitation for the following reasons: Direction of a multidisciplinary physical rehabilitation program to maximize functional independence : Yes Medical management of patient stability for increased activity during participation in an intensive rehabilitation regime.: Yes Analysis of laboratory values and/or radiology reports with any subsequent need for medication adjustment and/or medical intervention. : Yes   I attest that I was present, lead the team conference, and concur with the assessment and plan of the team.   Chana Bode B 11/15/2020, 1:04 PM

## 2020-11-15 NOTE — TOC Benefit Eligibility Note (Signed)
Patient Product/process development scientist completed.    The patient is currently admitted and upon discharge could be taking Eliquis 5 mg.  The current 30 day co-pay is, $495.58. Due to a $480.00 deductible.  The patient is insured through [employer] Part D     Walter [name], CPhT Pharmacy Patient Advocate Specialist [facility] Antimicrobial Stewardship Team Direct Number: [phone]  Fax: [phone]

## 2020-11-15 NOTE — Progress Notes (Signed)
Occupational Therapy Session Note  Patient Details  Name: Walter [name] MRN: [id number] Date of Birth: [birth year]  Today's Date: 11/15/2020 OT Individual Time: 1505-1605 OT Individual Time Calculation (min): 60 min    Short Term Goals: Week 1:  OT Short Term Goal 1 (Week 1): Pt will complete BSC or toilet transfer with 1 assist and LRAD OT Short Term Goal 1 - Progress (Week 1): Not met OT Short Term Goal 2 (Week 1): Pt will engage in self care or therapeutic activity while sitting unsupported for ~20 minutes with no more than Mod balance assistance OT Short Term Goal 2 - Progress (Week 1): Met OT Short Term Goal 3 (Week 1): Pt will be able to visually scan for 1 ADL item and successfully reach for it with no more than Mod A OT Short Term Goal 3 - Progress (Week 1): Met  Skilled Therapeutic Interventions/Progress Updates:    1:1. Pt received in bed agreeable to OT. No pain reported. Pt requesting to brush teeth. Pt requires MOD A to don shoes/fasten. Pt completes stand pivot transfers with NO AD with MIN A for steadying with min-mod post lean requiring VC for shifting weight into toes. Pt completes oral care seated at sink with increased time. Pt may benefit from weight on wrist d/t ataxia. Pt taken to gym for adaptive yoga session targeting pt favorite leisure activity to improve sitting balance, postural control, bimanual coordination,  Strengthening, and stretching. Pt participates in seated variations of sun salutations, lateral bending, cat/cow, bird dogs, crescent lunch (CGA), and extended side angle. Pt thoroughly enjoys and requesting to have another session this week. Pt returned to room seated in w/c with exit alarm on and call light  In reach  Therapy Documentation Precautions:  Precautions Precautions: Fall Precaution Comments: NG tube, trach, PMSV, R hemiparesis, hx of DVTs and PEs Other Brace: BUE resting hand splints, see schedule Restrictions Weight Bearing Restrictions:  No General:   Vital Signs: Therapy Vitals Temp: 97.8 F (36.6 C) Temp Source: Oral Pulse Rate: 82 Resp: 16 BP: 124/68 Patient Position (if appropriate): Lying Oxygen Therapy SpO2: 96 % O2 Device: Room Air Pain:   ADL: ADL Eating: Not assessed Grooming: Moderate assistance Where Assessed-Grooming: Edge of bed Upper Body Bathing: Moderate assistance Where Assessed-Upper Body Bathing: Edge of bed Lower Body Bathing: Dependent (+2 assist using Stedy for sit<stand) Where Assessed-Lower Body Bathing: Edge of bed,Other (Comment) (also sit<stand from [facility] using Stedy) Upper Body Dressing: Maximal assistance Lower Body Dressing: Dependent (+2 assist using Stedy) Where Assessed-Lower Body Dressing: Edge of bed,Other (Comment) (sit<stand from [facility] using Stedy) Toileting: Dependent (+2 using Stedy) Where Assessed-Toileting: Bedside Commode Toilet Transfer: Dependent (+2 using Stedy) Toilet Transfer Method: Arts development officer: Radiographer, therapeutic: Not assessed Vision   Perception    Praxis   Exercises:   Other Treatments:     Therapy/Group: Individual Therapy  Tonny Branch 11/15/2020, 6:57 AM

## 2020-11-15 NOTE — Progress Notes (Signed)
Physical Therapy Session Note  Patient Details  Name: Walter [name] MRN: [id number] Date of Birth: [birth year]  Today's Date: 11/15/2020 PT Individual Time:  0845-1000  PT Individual Time Calculation: 75 min  Short Term Goals: Week 3:  PT Short Term Goal 1 (Week 3): Pt will perform SPVT transfers using LRAD with consistent Min A. PT Short Term Goal 2 (Week 3): Pt will ambulate at least 68ft using LRAD with +2 Min A with wide BOS. PT Short Term Goal 3 (Week 3): pt will demonstrate dynamic standing balance with Min A/ CGA. PT Short Term Goal 4 (Week 3): Pt will propel w/c 150 feet with straight path and navigate turns with CGA.  Skilled Therapeutic Interventions/Progress Updates:  Patient supine in bed on entrance to room. Patient alert and agreeable to PT session. Relates that he needs to toilet and get dressed. Patient denied pain during session. +2 assist throughout for w/c follow and stand by assist.  Therapeutic Activity: Bed Mobility: Patient performed supine <> sit with supervision. VC/ tc required for positioning. Transfers: Patient performed STS transfer with supervision and established cues throughout session.   Toilet transfer performed with Min A for balance and pt able to doff clothing himself. Max A for pericare, Min A to don new brief, Mod A to thread new pants, max A to don TED hose and shoes for time.  SPVT transfers require instructions for technique prior to performance in order for pt to perform with min A. Squat pivot performed with vc/ tc with Min A. Provided verbal cues for hand placement/ progression and technique.  Wheelchair Mobility:  Patient propelled wheelchair 160 feet with supervision/ CGA. Provided vc for increasing/ decreasing LUE effort in order to maintain straight path.  Neuromuscular Re-ed: NMR facilitated during session with focus on static and dynamic standing balance. Pt challenged in balance with moderate to heavy perturbations about chest, back  and shoulders with pt able to withstand without UE support. Pt also guided in standing lunges for R then LLEs requiring CGA to Min A for maintaining balance. Also guided in lateral step outs using RW with CGA. VC throughout for correct performance. NMR performed for improvements in motor control and coordination, balance, sequencing, judgement, and self confidence/ efficacy in performing all aspects of mobility at highest level of independence.   Gait Training:  Patient ambulated 90' x2/ 125' x1 including 90 degree L turn using RW with Min A and up to Mod A intermittently for balance. Demonstrated improving step length on LLE through 60% of gait bouts. Provided vc/ tc for LLe step length/ width, upright posture and reducing downward pressure into walker, managing proximity to walker.  Patient supine in bed at end of session with brakes locked, bed alarm set, and all needs within reach.     Therapy Documentation Precautions:  Precautions Precautions: Fall Precaution Comments: NG tube, trach, PMSV, R hemiparesis, hx of DVTs and PEs Other Brace: BUE resting hand splints, see schedule Restrictions Weight Bearing Restrictions: No  Therapy/Group: Individual Therapy  Loel Dubonnet PT, DPT 11/15/2020, 8:00 AM

## 2020-11-16 NOTE — Plan of Care (Signed)
  Problem: RH BOWEL ELIMINATION Goal: RH STG MANAGE BOWEL WITH ASSISTANCE Description: STG Manage Bowel with  mod I Assistance. Outcome: Not Progressing; incontinent at times   Problem: RH BLADDER ELIMINATION Goal: RH STG MANAGE BLADDER WITH ASSISTANCE Description: STG Manage Bladder With  mod I Assistance Outcome: Not Progressing; incontinent at times

## 2020-11-16 NOTE — Progress Notes (Signed)
Physical Therapy Session Note  Patient Details  Name: Walter [name] MRN: [id number] Date of Birth: Sep 30, 1955  Today's Date: 11/16/2020 PT Individual Time: 6060-0459 and 1510-1555 PT Individual Time Calculation (min): 83 min and 45 min   Short Term Goals: Week 3:  PT Short Term Goal 1 (Week 3): Pt will perform SPVT transfers using LRAD with consistent Min A. PT Short Term Goal 2 (Week 3): Pt will ambulate at least 78f using LRAD with +2 Min A with wide BOS. PT Short Term Goal 3 (Week 3): pt will demonstrate dynamic standing balance with Min A/ CGA. PT Short Term Goal 4 (Week 3): Pt will propel w/c 150 feet with straight path and navigate turns with CGA.  Skilled Therapeutic Interventions/Progress Updates:   Session 1.   Pt received sitting in WC and agreeable to PT  Gait training in rehab gym with RW and min-mod assist x 1737f Gait training also performed outside on cement sidewalk with RW. [id number] assist from PT with WC follow for safety   Pt performed WC prulsion with supervision assist in hall. addictional WC mobility over cement sidewalk 2  200ft 55f supervision assist from PT with cues for symmetry of BUE  Kinetron 4 min +3 min with cues from PT for proper speed, LE positioning, and symmetry for ROM on the RLE/LLE. Pt reports need for urination.   Urination while sitting on toilet with supervision assist for clothing management in standing. Min-mod assist from PT for ambulatory transfer to toilet with RW and max cues for AD management, awareness, and improved gait pattern in turns.   Patient returned to room and [facility] and all needs met.    Session 2.   Pt received sitting in WC and agreeable to PT  Gait training with RW in rehab gym 2 x 40ft w30fmin assist and moderate cues for gait patter to reduce step length, improve step width, and maintain COM wihtin BOS of RW  Transfer training for sit<>stand and stand  pivot with min assist and UE support RW. cues to push into standing with UE on thighs to facilitate anterior weight shift.   Sitting balance with dual task to perform diagonals with therapy ball x 15 bil and then perform small toss and catch with attention on maintaining balance if ball not cought.   Urination on toilet with attempted BM. Pt unable to complete BM. Min assist with RW to perform stand pivot transfer [facility] In[facility] for gait pattern.   Patient returned to room and left sitting in WC withDet[facility]all bell in reach and all needs met.  Throughout session pt verbalized motor plan with noteded improvement in consistency and follow through with gait pattern and safety awareness.       Therapy Documentation Precautions:  Precautions Precautions: Fall Precaution Comments: NG tube, trach, PMSV, R hemiparesis, hx of DVTs and PEs Other Brace: BUE resting hand splints, see schedule Restrictions Weight Bearing Restrictions: No General:   Vital Signs:   Pain: Pain Assessment Pain Score: 0-No pain Mobility:   Locomotion :    Trunk/Postural Assessment :    Balance:   Exercises:   Other Treatments:      Therapy/Group: Individual Therapy  [name] [name], 11:09 AM

## 2020-11-16 NOTE — Progress Notes (Deleted)
Patient ID: Walter [name], male   DOB: [birth year], 65 y.o.   MRN: [id number]   Pt wound Vac was delivered on last night. Blossom Hoops from [facility] informed of pt wound VAC being D/C. Now wet to dry dressings. SW will discuss pick up with liaison. Sw will cont. To follow up.  [place], [place] [phone]

## 2020-11-16 NOTE — Progress Notes (Signed)
PROGRESS NOTE   Subjective/Complaints:  Discussed 15/7 schedule   ROS-  Pt denies SOB, abd pain, CP, N/V/C/D, pain and vision changes- double vision improving - notices mainly with near vision   Objective:   No results found. No results for input(s): WBC, HGB, HCT, PLT in the last 72 hours. No results for input(s): NA, K, CL, CO2, GLUCOSE, BUN, CREATININE, CALCIUM in the last 72 hours.  Intake/Output Summary (Last 24 hours) at 11/16/2020 0714 Last data filed at 11/16/2020 0420 Gross per 24 hour  Intake 118 ml  Output 650 ml  Net -532 ml     Pressure Injury 10/21/20 Head Left;Posterior Stage 1 -  Intact skin with non-blanchable redness of a localized area usually over a bony prominence. Pink non-blanchable scalp with thinning hair on posterior left side of head (Active)  10/21/20 1100  Location: Head  Location Orientation: Left;Posterior  Staging: Stage 1 -  Intact skin with non-blanchable redness of a localized area usually over a bony prominence.  Wound Description (Comments): Pink non-blanchable scalp with thinning hair on posterior left side of head  Present on Admission: No    Physical Exam: Vital Signs Blood pressure 128/60, pulse 77, temperature 97.8 F (36.6 C), temperature source Oral, resp. rate 20, weight 90.5 kg, SpO2 96 %.  ENT-trach site uncovered and healing well  General: No acute distress Mood and affect are appropriate Heart: Regular rate and rhythm no rubs murmurs or extra sounds Lungs: Clear to auscultation, breathing unlabored, no rales or wheezes Abdomen: Positive bowel sounds, soft nontender to palpation, nondistended Extremities: No clubbing, cyanosis, or edema Skin: No evidence of breakdown, no evidence of rash Neurologic: , motor strength is 4/5 in bilateral deltoid, bicep, tricep, grip, hip flexor, knee extensors, ankle dorsiflexor and plantar flexor Sensory exam normal sensation to light  touch and proprioception in bilateral upper and lower extremities Cerebellar exam severe dysmetria Left FNF     Musculoskeletal: Full range of motion in all 4 extremities. No joint swelling, no joint effusion R foot, No pain with ROM    Assessment/Plan: 1. Functional deficits which require 3+ hours per day of interdisciplinary therapy in a comprehensive inpatient rehab setting.  Physiatrist is providing close team supervision and 24 hour management of active medical problems listed below.  Physiatrist and rehab team continue to assess barriers to discharge/monitor patient progress toward functional and medical goals  Care Tool:  Bathing    Body parts bathed by patient: Right arm,Left arm,Chest,Face,Right upper leg,Left upper leg,Abdomen,Buttocks,Front perineal area   Body parts bathed by helper: Left lower leg,Right lower leg     Bathing assist Assist Level: Minimal Assistance - Patient > 75%     Upper Body Dressing/Undressing Upper body dressing   What is the patient wearing?: Pull over shirt    Upper body assist Assist Level: Supervision/Verbal cueing    Lower Body Dressing/Undressing Lower body dressing      What is the patient wearing?: Pants,Underwear/pull up     Lower body assist Assist for lower body dressing: Minimal Assistance - Patient > 75%     Toileting Toileting    Toileting assist Assist for toileting: Moderate Assistance - Patient 50 -  74%     Transfers Chair/bed transfer  Transfers assist  Chair/bed transfer activity did not occur: Safety/medical concerns  Chair/bed transfer assist level: Moderate Assistance - Patient 50 - 74%     Locomotion Ambulation   Ambulation assist   Ambulation activity did not occur: Safety/medical concerns  Assist level: 2 helpers Assistive device: Walker-rolling Max distance: 20'   Walk 10 feet activity   Assist  Walk 10 feet activity did not occur: Safety/medical concerns        Walk 50 feet  activity   Assist Walk 50 feet with 2 turns activity did not occur: Safety/medical concerns         Walk 150 feet activity   Assist Walk 150 feet activity did not occur: Safety/medical concerns         Walk 10 feet on uneven surface  activity   Assist Walk 10 feet on uneven surfaces activity did not occur: Safety/medical concerns         Wheelchair     Assist Will patient use wheelchair at discharge?:  (TBD)             Wheelchair 50 feet with 2 turns activity    Assist            Wheelchair 150 feet activity     Assist          Blood pressure 128/60, pulse 77, temperature 97.8 F (36.6 C), temperature source Oral, resp. rate 20, weight 90.5 kg, SpO2 96 %.  Medical Problem List and Plan: 1. Right side weakness and slurred speech secondary to intracerebral hemorrhage complicated by hydrocephalus. Status post EVD placed per neurosurgery -patient may not shower -ELOS/Goals: 5/26- pt would like to go home early , discussed that he would not have the intensity of therapy at home  [facility] after decannulation  2. Acute PE/bilateral lower extremity DVT: Status post IVC filter 10/08/2020 with thrombectomy and retrieval of IVC filter 10/28/2020 per interventional radiology (still has suprarenal filter ) No RLE, scheduled for repeat thrombectomy this week -DVT/anticoagulation: Continue Eliquis-  5/7- per IR< also needs SCDs at all times in bed- IR went over with nursing. -antiplatelet therapy: N/A 3. Pain Management: Decrease oxycodone to daily prn  5/16- pt denies pain- con't regimen 4. Mood: Amantadine 200 mg twice daily, may need to reduce if pt becomes agitated  -antipsychotic agents: N/A 5. Neuropsych: This patient is not capable of making decisions on his own behalf. 6. Skin/Wound Care: Routine skin checks 7. Fluids/Electrolytes/Nutrition:  Routine in and outs with follow-up chemistries 8. PAF. Status post ablation cardioversion in the Past. Continue Eliquis. Cardio exam 30 mg every 6 hours. Cardiac rate controlled diltiazem to 30mg   TID  Vitals:   11/15/20 1937 11/16/20 0419  BP: 122/77 128/60  Pulse: 79 77  Resp: 16 20  Temp: 98.5 F (36.9 C) 97.8 F (36.6 C)  SpO2: 96% 96%   9. Acute hypoxic respiratory failure. Tracheostomy 10/04/2020 decannulated 11/09/20  10. Hospital-acquired pneumonia/ventilator associated pneumonia. Infectious disease follow-up antibiotic therapy completed  11. Dysphagia. Continue Dysphagia #3 thin liquids.good intake 12. Hyperglycemia related to tube feeds. Resolved, off lantus  CBG (last 3)  No results for input(s): GLUCAP in the last 72 hours.will d/c CBG not diabetic will be off TF- CBGs nl- will d/c  13.  RIght foot pain ? Tibial nerve irritation post procedure circulation is good, no MSK exam abnl 14. Insomnia  Reduced trazodone after  discussion with Phrmacy regarding potential for bradycardia  5/10 slept better off TF  15.  Dysuria: UA negative, UC with multiple species LOS: 19 days A FACE TO FACE EVALUATION WAS PERFORMED  Erick Colace 11/16/2020, 7:14 AM

## 2020-11-16 NOTE — Progress Notes (Addendum)
Patient ID: Walter [name], male   DOB: 1956-02-13, 65 y.o.   MRN: [id number]   SW received notification from [facility] that RN call was not completed as requested on Monday. SW reached out to nursing and provided contact information of [facility] Corwin at [phone] to discuss pt care needs at d/c. Sw provided Agency with updates and spouse contact information.   Information faxed to [facility] at [phone]  [place] Gu[place], [place] [phone]

## 2020-11-16 NOTE — Progress Notes (Signed)
Occupational Therapy Weekly Progress Note  Patient Details  Name: Walter [name] MRN: [id number] Date of Birth: 11/04/1955  Beginning of progress report period: Nov 08, 2019 End of progress report period: Nov 16, 2020  Today's Date: 11/16/2020 OT Individual Time: 2725-3664 OT Individual Time Calculation (min): 60 min    Patient has met 3 of 3 short term goals.  Pt cont to make steady progress with OT at this time. Has demonstrated improvement in static/dynamic sitting and standing balance, BUE coordination, and improved dexterity to complete LBD at min A level, toilet/shower transfers min to mod A level, and grooming at overall min A level. Pt cont to be primarily limited by ataxic movements in BUE/BLE, decreased safety awareness, and limited insight into deficits/impulsivity.   Patient continues to demonstrate the following deficits: muscle weakness, decreased cardiorespiratoy endurance, impaired timing and sequencing, abnormal tone, unbalanced muscle activation, ataxia, decreased coordination and decreased motor planning, diplopia, decreased attention, decreased awareness, decreased problem solving, decreased safety awareness, decreased memory and delayed processing and decreased sitting balance, decreased standing balance and decreased postural control and therefore will continue to benefit from skilled OT intervention to enhance overall performance with BADL and Reduce care partner burden.  Patient progressing toward long term goals..  Continue plan of care.  OT Short Term Goals Week 3:  OT Short Term Goal 1 (Week 3): STG = LTG 2/2 ELOS  Skilled Therapeutic Interventions/Progress Updates:    Pt received semi-reclined in bed, denies pain and dizziness throughout session,  agreeable to therapy. Doffed/donned shirt close S for sitting unsupported. Req to use stedy to transfer to shower 2/2 time constraints. Stedy transfer with CGA for STS, pt req consistent VCs to remain seated and to hold  onto bar for safety. Trach stoma covered with bandage. Seated on TTB, bathes full-body with CGA when leaning forward, able to bathe buttocks via lateral leans. Donned underwear/pants with min A for balance in STS. Donned B teds max A , donned B shoes mod A to adjust over heels and to tie. Completed hair care seated close S. Self-propelled w/c to and from gym with min A for obstacle/doorway navigation. Seated in w/c with 3 lb wrist weight placed on LUE, retrieved and constructed lego brick design with overall min A to interlock blocks. Noted improvement in decreasing ataxic movements with L wrist weight, forearm stabilization, and cues to slow movement. Pt agreeable to be left seated in w/c.  Pt left in w/c with safety belt alarm engaged, call bell in reach, and all immediate needs met.     Therapy Documentation Precautions:  Precautions Precautions: Fall Precaution Comments: NG tube, trach, PMSV, R hemiparesis, hx of DVTs and PEs Other Brace: BUE resting hand splints, see schedule Restrictions Weight Bearing Restrictions: No Pain: denies   ADL: See Care Tool for more details.   Therapy/Group: Individual Therapy  [name] MS, OTR/L  11/16/2020, 6:40 AM

## 2020-11-16 NOTE — Progress Notes (Signed)
Called and updated Walter [name] at [facility].

## 2020-11-17 MED ORDER — DILTIAZEM HCL 30 MG PO TABS
30.0000 mg | ORAL_TABLET | Freq: Once | ORAL | Status: AC
  Administered 2020-11-17: 30 mg via ORAL
  Filled 2020-11-17: qty 1

## 2020-11-17 NOTE — Progress Notes (Signed)
PROGRESS NOTE   Subjective/Complaints:  No new issues no breathing issues, pt has poor awareness of severe left sided ataxia   ROS-  Pt denies SOB, abd pain, CP, N/V/C/D,  double vision improving - notices mainly with near vision   Objective:   No results found. No results for input(s): WBC, HGB, HCT, PLT in the last 72 hours. No results for input(s): NA, K, CL, CO2, GLUCOSE, BUN, CREATININE, CALCIUM in the last 72 hours.  Intake/Output Summary (Last 24 hours) at 11/17/2020 0656 Last data filed at 11/16/2020 2100 Gross per 24 hour  Intake 600 ml  Output 750 ml  Net -150 ml     Pressure Injury 10/21/20 Head Left;Posterior Stage 1 -  Intact skin with non-blanchable redness of a localized area usually over a bony prominence. Pink non-blanchable scalp with thinning hair on posterior left side of head (Active)  10/21/20 1100  Location: Head  Location Orientation: Left;Posterior  Staging: Stage 1 -  Intact skin with non-blanchable redness of a localized area usually over a bony prominence.  Wound Description (Comments): Pink non-blanchable scalp with thinning hair on posterior left side of head  Present on Admission: No    Physical Exam: Vital Signs Blood pressure 119/79, pulse 75, temperature 97.7 F (36.5 C), temperature source Oral, resp. rate 20, weight 90.5 kg, SpO2 98 %.  ENT-trach site uncovered and healing well   General: No acute distress Mood and affect are appropriate Heart: Regular rate and rhythm no rubs murmurs or extra sounds Lungs: Clear to auscultation, breathing unlabored, no rales or wheezes Abdomen: Positive bowel sounds, soft nontender to palpation, nondistended Extremities: No clubbing, cyanosis, or edema Skin: No evidence of breakdown, no evidence of rash  Neurologic: , motor strength is 4/5 in bilateral deltoid, bicep, tricep, grip, hip flexor, knee extensors, ankle dorsiflexor and plantar  flexor Sensory exam normal sensation to light touch and proprioception in bilateral upper and lower extremities Cerebellar exam severe dysmetria Left FNF     Musculoskeletal: Full range of motion in all 4 extremities. No joint swelling, no joint effusion R foot, No pain with ROM    Assessment/Plan: 1. Functional deficits which require 3+ hours per day of interdisciplinary therapy in a comprehensive inpatient rehab setting.  Physiatrist is providing close team supervision and 24 hour management of active medical problems listed below.  Physiatrist and rehab team continue to assess barriers to discharge/monitor patient progress toward functional and medical goals  Care Tool:  Bathing    Body parts bathed by patient: Right arm,Left arm,Chest,Face,Right upper leg,Left upper leg,Abdomen,Buttocks,Front perineal area,Right lower leg,Left lower leg   Body parts bathed by helper: Left lower leg,Right lower leg     Bathing assist Assist Level: Contact Guard/Touching assist     Upper Body Dressing/Undressing Upper body dressing   What is the patient wearing?: Pull over shirt    Upper body assist Assist Level: Supervision/Verbal cueing    Lower Body Dressing/Undressing Lower body dressing      What is the patient wearing?: Pants,Underwear/pull up     Lower body assist Assist for lower body dressing: Minimal Assistance - Patient > 75%     Toileting Toileting  Toileting assist Assist for toileting: Moderate Assistance - Patient 50 - 74%     Transfers Chair/bed transfer  Transfers assist  Chair/bed transfer activity did not occur: Safety/medical concerns  Chair/bed transfer assist level: Moderate Assistance - Patient 50 - 74%     Locomotion Ambulation   Ambulation assist   Ambulation activity did not occur: Safety/medical concerns  Assist level: 2 helpers Assistive device: Walker-rolling Max distance: 20'   Walk 10 feet activity   Assist  Walk 10 feet  activity did not occur: Safety/medical concerns        Walk 50 feet activity   Assist Walk 50 feet with 2 turns activity did not occur: Safety/medical concerns         Walk 150 feet activity   Assist Walk 150 feet activity did not occur: Safety/medical concerns         Walk 10 feet on uneven surface  activity   Assist Walk 10 feet on uneven surfaces activity did not occur: Safety/medical concerns         Wheelchair     Assist Will patient use wheelchair at discharge?:  (TBD)             Wheelchair 50 feet with 2 turns activity    Assist            Wheelchair 150 feet activity     Assist          Blood pressure 119/79, pulse 75, temperature 97.7 F (36.5 C), temperature source Oral, resp. rate 20, weight 90.5 kg, SpO2 98 %.  Medical Problem List and Plan: 1. Right side weakness and slurred speech secondary to intracerebral hemorrhage complicated by hydrocephalus. Status post EVD placed per neurosurgery -patient may not shower -ELOS/Goals: 5/26- pt would like to go home early , discussed that he would not have the intensity of therapy at home  [facility] after decannulation  2. Acute PE/bilateral lower extremity DVT: Status post IVC filter 10/08/2020 with thrombectomy and retrieval of IVC filter 10/28/2020 per interventional radiology (still has suprarenal filter ) No RLE, scheduled for repeat thrombectomy to debulk clot from filter was successful no signs of post phlebitic syndrome -DVT/anticoagulation: Continue Eliquis-  5/7- per IR< also needs SCDs at all times in bed- IR went over with nursing. -antiplatelet therapy: N/A 3. Pain Management: Decrease oxycodone to daily prn  5/16- pt denies pain- con't regimen 4. Mood: Amantadine 200 mg twice daily, may need to reduce if pt becomes agitated  -antipsychotic agents: N/A 5. Neuropsych:  This patient is not capable of making decisions on his own behalf. 6. Skin/Wound Care: Routine skin checks 7. Fluids/Electrolytes/Nutrition: Routine in and outs with follow-up chemistries 8. PAF. Status post ablation cardioversion in the Past. Continue Eliquis. Cardio exam 30 mg every 6 hours. Cardiac rate controlled diltiazem to 30mg   TID  Vitals:   11/16/20 1940 11/17/20 0444  BP: 127/75 119/79  Pulse: 82 75  Resp: 17 20  Temp: 98.3 F (36.8 C) 97.7 F (36.5 C)  SpO2: 95% 98%   9. Acute hypoxic respiratory failure. Resolved Tracheostomy 10/04/2020 decannulated 11/09/20  10. Hospital-acquired pneumonia/ventilator associated pneumonia. Infectious disease follow-up antibiotic therapy completed- resolved   11. Dysphagia. Continue Dysphagia #3 thin liquids.good intake 12. Hyperglycemia related to tube feeds. Resolved, off lantus  CBG (last 3)  No results for input(s): GLUCAP in the last 72 hours.will d/c CBG not diabetic will be off TF- CBGs nl- will d/c  13.  RIght foot pain resolved  14. Insomnia  Reduced trazodone after discussion with Phrmacy regarding potential for bradycardia  5/10 slept better off TF  15.  Dysuria: UA negative, UC with multiple species LOS: 20 days A FACE TO FACE EVALUATION WAS PERFORMED  Erick Colace 11/17/2020, 6:56 AM

## 2020-11-17 NOTE — Progress Notes (Signed)
Speech Language Pathology Daily Session Note  Patient Details  Name: Walter [name] MRN: [id number] Date of Birth: December 04, 1955  Today's Date: 11/17/2020 SLP Individual Time: 6962-9528 SLP Individual Time Calculation (min): 45 min  Short Term Goals: Week 3: SLP Short Term Goal 1 (Week 3): STGs=LTGs due to ELOS  Skilled Therapeutic Interventions: Skilled treatment session focused on cognitive and dysphagia goals. Upon arrival, patient requested assistance using his phone. SLP facilitated session by providing Min-Mod A verbal and visual cues for problem solving during phone navigation. SLP also facilitated session by providing trials of regular textures. Patient demonstrated efficient mastication with mild oral residue that cleared with a liquid wash. Intermittent throat clearing was noted with sips of thin via his "sip cup" from home, suspect due to poor coordination and bolus size. Overt s/s of aspiration were eliminated with sips via straw, therefore, recommend liquids via straw. Patient verbalized understanding and required education regarding his current swallowing function and overall safety. Patient left upright in wheelchair with alarm on and all needs within reach. Continue with current plan of care.      Pain No/Denies Pain   Therapy/Group: Individual Therapy  Wilmetta Speiser 11/17/2020, 2:49 PM

## 2020-11-17 NOTE — Progress Notes (Signed)
Occupational Therapy Session Note  Patient Details  Name: Walter [name] MRN: [id number] Date of Birth: 20-Jun-1956  Today's Date: 11/17/2020 OT Individual Time: [phone] OT Individual Time Calculation (min): 32 min    Short Term Goals: Week 1:  OT Short Term Goal 1 (Week 1): Pt will complete BSC or toilet transfer with 1 assist and LRAD OT Short Term Goal 1 - Progress (Week 1): Not met OT Short Term Goal 2 (Week 1): Pt will engage in self care or therapeutic activity while sitting unsupported for ~20 minutes with no more than Mod balance assistance OT Short Term Goal 2 - Progress (Week 1): Met OT Short Term Goal 3 (Week 1): Pt will be able to visually scan for 1 ADL item and successfully reach for it with no more than Mod A OT Short Term Goal 3 - Progress (Week 1): Met Week 2:  OT Short Term Goal 1 (Week 2): Pt will complete STS in prep for standing ADL with mod A of 1. OT Short Term Goal 1 - Progress (Week 2): Met OT Short Term Goal 2 (Week 2): Pt will complete seated grooming with min A for task completion. OT Short Term Goal 2 - Progress (Week 2): Met OT Short Term Goal 3 (Week 2): Pt will complete BSC or toilet transfer with 1 assist and LRAD OT Short Term Goal 3 - Progress (Week 2): Met Week 3:  OT Short Term Goal 1 (Week 3): STG = LTG 2/2 ELOS  Skilled Therapeutic Interventions/Progress Updates:    Pt received in bed and consented to OT tx. Pt seen for LB dressing, footwear mgmt training, sitting balance, anf [facility] training this session. Pt is highly motivated but mildly impulsive, tries to sit EOB with bed rails still up by bringing feet over the bed rails. Instructed pt to don TED hose stockings and thread BLEs through pants while semifowler in bed for safety 2/2 decreased sitting balance. Pt very unsteady when trying to don shoes EOB and demo's moderate gross coordination and balance deficits requiring therapist assist for donning shoes while maintaining sitting balance EOB. Pt  requires mod cuing for proper hand placement during stand pivot transfer with RW as pt grabs front of walker while sitting EOB. Cuing to keep walker close and for overall safety with AD during transfer with min A for steadying and instruction. Instructed in pink theraputty activity with bead retrieval to increase [facility] and intrinsic hand strength for ADLs. After tx, pt left up in w/c with seatbelt alarm on and all needs met.   Therapy Documentation Precautions:  Precautions Precautions: Fall Precaution Comments: NG tube, trach, PMSV, R hemiparesis, hx of DVTs and PEs Other Brace: BUE resting hand splints, see schedule Restrictions Weight Bearing Restrictions: No Vital Signs: Therapy Vitals Temp: 97.7 F (36.5 C) Temp Source: Oral Pulse Rate: 75 Resp: 20 BP: 119/79 Patient Position (if appropriate): Lying Oxygen Therapy SpO2: 98 % O2 Device: Room Air Pain:  none     Therapy/Group: Individual Therapy  Kelcey Korus 11/17/2020, 8:09 AM

## 2020-11-17 NOTE — Progress Notes (Signed)
Physical Therapy Session Note  Patient Details  Name: Walter [name] MRN: [id number] Date of Birth: 28-Feb-1956  Today's Date: 11/17/2020 PT Individual Time: 7340-3709 PT Individual Time Calculation (min): 70 min   Short Term Goals: Week 3:  PT Short Term Goal 1 (Week 3): Pt will perform SPVT transfers using LRAD with consistent Min A. PT Short Term Goal 2 (Week 3): Pt will ambulate at least 75f using LRAD with +2 Min A with wide BOS. PT Short Term Goal 3 (Week 3): pt will demonstrate dynamic standing balance with Min A/ CGA. PT Short Term Goal 4 (Week 3): Pt will propel w/c 150 feet with straight path and navigate turns with CGA. Week 4:     Skilled Therapeutic Interventions/Progress Updates:   Pt received sitting in WC and agreeable to PT. Pt performed WC mobility x 2523fwith supervision assist to day room. Sit<>stand from WC[place] 15 throughout session with CGA and  Cues for LE position and UE placement to facilitate anterior weight shift. Noted improvement in carryover of cues from previous sessions. Dynamic balance training to perform lateral tap of ball in BUE while in semitandem stance x 10  bil as well as diagonals with BUE hold on ball in semitandem, x 8 Bil. Min assist for balance throughout as well as improved awareness to lateral LOB and decreased speed.   Gait training with RW x 18044fith min-mod assist for safety throughout. Moderate cues for improved step length on the LL, decreaesd step length on the RLE, decreased speed and improved AD position and keep COM within BOS of RW. Pt noted to take several standing rest breaks to correct Lateral lean without instruction from PT.   Tall kneeling forward reach/cross body reach and lateral reach x 12 each with BUE. 1# ankle weight on the LUE to improve coordination. Min assist to maintain balance throughout and decrease speed of movements.  Pt then performed forward reach from Qped x 8 Bil to pick up bean bag and place in bucket. Donkey  kicks x 9 Bil with min-mod assist to stabilize trunk. Squat from tall kneeling x 10 with min assist from PT to maintain netrual AP control.   Patient returned to room and performed stand pivot transfer to toilet with min assist and RW. Able to urinate sitting on toilet with supervision assist. Stand pivot transfer back to WC [facility] min assist as listed, and left sitting in WC [facility] call bell in reach and all needs met.    Spoke with wife to set up family ed over the weekend with OT per schedule set for weekend therapies.       Therapy Documentation Precautions:  Precautions Precautions: Fall Precaution Comments: NG tube, trach, PMSV, R hemiparesis, hx of DVTs and PEs Other Brace: BUE resting hand splints, see schedule Restrictions Weight Bearing Restrictions: No Vital Signs: Therapy Vitals Temp: 98.3 F (36.8 C) Temp Source: Oral Pulse Rate: (!) 101 Resp: 16 BP: 140/83 Patient Position (if appropriate): Sitting Oxygen Therapy SpO2: 99 % O2 Device: Room Air Pain: Pain Assessment Pain Scale: 0-10 Pain Score: 0-No pain   Therapy/Group: Individual Therapy  AusLorie Phenix20/2022, 6:15 PM

## 2020-11-17 NOTE — Progress Notes (Signed)
Occupational Therapy Session Note  Patient Details  Name: Walter [name] MRN: [id number] Date of Birth: 1955-09-13  Today's Date: 11/17/2020 OT Individual Time: [id number] OT Individual Time Calculation (min): 32 min    Short Term Goals: Week 1:  OT Short Term Goal 1 (Week 1): Pt will complete BSC or toilet transfer with 1 assist and LRAD OT Short Term Goal 1 - Progress (Week 1): Not met OT Short Term Goal 2 (Week 1): Pt will engage in self care or therapeutic activity while sitting unsupported for ~20 minutes with no more than Mod balance assistance OT Short Term Goal 2 - Progress (Week 1): Met OT Short Term Goal 3 (Week 1): Pt will be able to visually scan for 1 ADL item and successfully reach for it with no more than Mod A OT Short Term Goal 3 - Progress (Week 1): Met  Skilled Therapeutic Interventions/Progress Updates:    Pt participated in rhythmic drumming group. Pain not reported during session. Focus of group on BUE coordination, strengthening, endurance, timing/control, activity tolerance, and social participation and engagement. Pt performs session from seated position for energy conservation. Skilled interventions included no grips this week and only dropped drumstick 1x v 6x last week! Much improved grip strength and coordination. Still presents with ataxia in BUE and trunk with impulsivity getting drumstick off the floor 1x.. Warm up performed prior to exercises and UB stretching completed at end of group with demo from OT. Pt able to select preferred song to share with group. Returned pt to room at end of session. Exited session with pt seated in [facility] on toilet with direct handoff to NT, exit alarm on and call light in reach  Therapy Documentation Precautions:  Precautions Precautions: Fall Precaution Comments: NG tube, trach, PMSV, R hemiparesis, hx of DVTs and PEs Other Brace: BUE resting hand splints, see schedule Restrictions Weight Bearing Restrictions: No General:    Vital Signs: Therapy Vitals Temp: 97.7 F (36.5 C) Temp Source: Oral Pulse Rate: 75 Resp: 20 BP: 119/79 Patient Position (if appropriate): Lying Oxygen Therapy SpO2: 98 % O2 Device: Room Air Pain:   ADL: ADL Eating: Not assessed Grooming: Moderate assistance Where Assessed-Grooming: Edge of bed Upper Body Bathing: Moderate assistance Where Assessed-Upper Body Bathing: Edge of bed Lower Body Bathing: Dependent (+2 assist using Stedy for sit<stand) Where Assessed-Lower Body Bathing: Edge of bed,Other (Comment) (also sit<stand from [facility] using Stedy) Upper Body Dressing: Maximal assistance Lower Body Dressing: Dependent (+2 assist using Stedy) Where Assessed-Lower Body Dressing: Edge of bed,Other (Comment) (sit<stand from [facility] using Stedy) Toileting: Dependent (+2 using Stedy) Where Assessed-Toileting: Bedside Commode Toilet Transfer: Dependent (+2 using Stedy) Toilet Transfer Method: Arts development officer: Radiographer, therapeutic: Not assessed Vision   Perception    Praxis   Exercises:   Other Treatments:     Therapy/Group: Group Therapy  Tonny Branch 11/17/2020, 6:58 AM

## 2020-11-18 DIAGNOSIS — I480 Paroxysmal atrial fibrillation: Secondary | ICD-10-CM

## 2020-11-18 DIAGNOSIS — G479 Sleep disorder, unspecified: Secondary | ICD-10-CM

## 2020-11-18 DIAGNOSIS — I8290 Acute embolism and thrombosis of unspecified vein: Secondary | ICD-10-CM

## 2020-11-18 DIAGNOSIS — I69391 Dysphagia following cerebral infarction: Secondary | ICD-10-CM

## 2020-11-18 DIAGNOSIS — I2699 Other pulmonary embolism without acute cor pulmonale: Secondary | ICD-10-CM

## 2020-11-18 NOTE — Progress Notes (Signed)
PROGRESS NOTE   Subjective/Complaints: Patient seen laying in bed this morning.  He states he slept well overnight.  He denies complaints.  ROS: + Visual disturbance.  Denies CP, SOB, N/V/D  Objective:   No results found. No results for input(s): WBC, HGB, HCT, PLT in the last 72 hours. No results for input(s): NA, K, CL, CO2, GLUCOSE, BUN, CREATININE, CALCIUM in the last 72 hours.  Intake/Output Summary (Last 24 hours) at 11/18/2020 0835 Last data filed at 11/18/2020 0805 Gross per 24 hour  Intake 616 ml  Output 450 ml  Net 166 ml     Pressure Injury 10/21/20 Head Left;Posterior Stage 1 -  Intact skin with non-blanchable redness of a localized area usually over a bony prominence. Pink non-blanchable scalp with thinning hair on posterior left side of head (Active)  10/21/20 1100  Location: Head  Location Orientation: Left;Posterior  Staging: Stage 1 -  Intact skin with non-blanchable redness of a localized area usually over a bony prominence.  Wound Description (Comments): Pink non-blanchable scalp with thinning hair on posterior left side of head  Present on Admission: No    Physical Exam: Vital Signs Blood pressure 131/77, pulse 77, temperature 98.2 F (36.8 C), temperature source Oral, resp. rate 18, weight 91 kg, SpO2 99 %. Constitutional: No distress . Vital signs reviewed. HENT: Normocephalic.  Atraumatic. Eyes: EOMI. No discharge. Cardiovascular: No JVD.  RRR. Respiratory: Normal effort.  No stridor.  Bilateral clear to auscultation. GI: Non-distended.  BS +. Skin: Warm and dry.  Intact. Psych: Normal mood.  Normal behavior. Musc: No edema in extremities.  No tenderness in extremities. Neuro: Alert Dysarthria Motor: Grossly 4/5 throughout LUE with dysmetria  Assessment/Plan: 1. Functional deficits which require 3+ hours per day of interdisciplinary therapy in a comprehensive inpatient rehab  setting.  Physiatrist is providing close team supervision and 24 hour management of active medical problems listed below.  Physiatrist and rehab team continue to assess barriers to discharge/monitor patient progress toward functional and medical goals  Care Tool:  Bathing    Body parts bathed by patient: Right arm,Left arm,Chest,Face,Right upper leg,Left upper leg,Abdomen,Buttocks,Front perineal area,Right lower leg,Left lower leg   Body parts bathed by helper: Left lower leg,Right lower leg     Bathing assist Assist Level: Contact Guard/Touching assist     Upper Body Dressing/Undressing Upper body dressing   What is the patient wearing?: Pull over shirt    Upper body assist Assist Level: Supervision/Verbal cueing    Lower Body Dressing/Undressing Lower body dressing      What is the patient wearing?: Pants     Lower body assist Assist for lower body dressing: Minimal Assistance - Patient > 75%     Toileting Toileting    Toileting assist Assist for toileting: Moderate Assistance - Patient 50 - 74%     Transfers Chair/bed transfer  Transfers assist  Chair/bed transfer activity did not occur: Safety/medical concerns  Chair/bed transfer assist level: Moderate Assistance - Patient 50 - 74%     Locomotion Ambulation   Ambulation assist   Ambulation activity did not occur: Safety/medical concerns  Assist level: 2 helpers Assistive device: Walker-rolling Max distance: 26'  Walk 10 feet activity   Assist  Walk 10 feet activity did not occur: Safety/medical concerns        Walk 50 feet activity   Assist Walk 50 feet with 2 turns activity did not occur: Safety/medical concerns         Walk 150 feet activity   Assist Walk 150 feet activity did not occur: Safety/medical concerns         Walk 10 feet on uneven surface  activity   Assist Walk 10 feet on uneven surfaces activity did not occur: Safety/medical concerns          Wheelchair     Assist Will patient use wheelchair at discharge?:  (TBD)             Wheelchair 50 feet with 2 turns activity    Assist            Wheelchair 150 feet activity     Assist          Blood pressure 131/77, pulse 77, temperature 98.2 F (36.8 C), temperature source Oral, resp. rate 18, weight 91 kg, SpO2 99 %.  Medical Problem List and Plan: 1. Right side hemiparesis and slurred speech secondary to intracerebral hemorrhage complicated by hydrocephalus. Status post EVD placed per neurosurgery  Continue CIR 2. Acute PE/bilateral lower extremity DVT: Status post IVC filter 10/08/2020 with thrombectomy and retrieval of IVC filter 10/28/2020 per interventional radiology (still has suprarenal filter ) No RLE, scheduled for repeat thrombectomy to debulk clot from filter was successful no signs of post phlebitic syndrome -DVT/anticoagulation: Continue Eliquis-    CBC ordered for Monday  5/7- per IR< also needs SCDs at all times in bed- IR went over with nursing. -antiplatelet therapy: N/A 3. Pain Management: Decrease oxycodone to daily prn  Controlled on 5/21 4. Mood: Amantadine 200 mg twice daily, may need to reduce if pt becomes agitated  -antipsychotic agents: N/A 5. Neuropsych: This patient is not capable of making decisions on his own behalf. 6. Skin/Wound Care: Routine skin checks 7. Fluids/Electrolytes/Nutrition: Routine in and outs with follow-up chemistries 8. PAF. Status post ablation cardioversion in the Past. Continue Eliquis. Cardio exam 30 mg every 6 hours. Cardiac rate controlled diltiazem to 30mg   TID  Vitals:   11/18/20 0253 11/18/20 0651  BP: 115/64 131/77  Pulse: 79 77  Resp: 20 18  Temp: 97.8 F (36.6 C) 98.2 F (36.8 C)  SpO2: 97% 99%   Controlled on 5/21 9. Acute hypoxic respiratory failure. Resolved Tracheostomy 10/04/2020 decannulated 11/09/20  10. Hospital-acquired pneumonia/ventilator  associated pneumonia. Infectious disease follow-up antibiotic therapy completed- resolved  11. Post stroke dysphagia. Continue Dysphagia #3 thin liquids 12. Hyperglycemia related to tube feeds. Resolved, off lantus  CBG (last 3)  No results for input(s): GLUCAP in the last 72 hours.will d/c CBG not diabetic will be off TF- CBGs nl- d/ced 13.  RIght foot pain resolved  14.  Sleep disturbance  Reduced trazodone after discussion with Phrmacy regarding potential for bradycardia   Improved 15.  Dysuria: UA negative, UC with multiple species  LOS: 21 days A FACE TO FACE EVALUATION WAS PERFORMED  Walter [name] 01/09/21 11/18/2020, 8:35 AM

## 2020-11-18 NOTE — Plan of Care (Signed)
  Problem: RH Bed to Chair Transfers Goal: LTG Patient will perform bed/chair transfers w/assist (PT) Description: LTG: Patient will perform bed to chair transfers with assistance (PT). Flowsheets (Taken 11/18/2020 1716) LTG: Pt will perform Bed to Chair Transfers with assistance level: Minimal Assistance - Patient > 75% Note: Downgraded due to safety concerns.

## 2020-11-18 NOTE — Progress Notes (Signed)
Occupational Therapy Session Note  Patient Details  Name: Walter [name] MRN: [id number] Date of Birth: 08/13/1955  Today's Date: 11/18/2020 OT Individual Time: 1500-1600 OT Individual Time Calculation (min): 60 min    Short Term Goals: Week 1:  OT Short Term Goal 1 (Week 1): Pt will complete BSC or toilet transfer with 1 assist and LRAD OT Short Term Goal 1 - Progress (Week 1): Not met OT Short Term Goal 2 (Week 1): Pt will engage in self care or therapeutic activity while sitting unsupported for ~20 minutes with no more than Mod balance assistance OT Short Term Goal 2 - Progress (Week 1): Met OT Short Term Goal 3 (Week 1): Pt will be able to visually scan for 1 ADL item and successfully reach for it with no more than Mod A OT Short Term Goal 3 - Progress (Week 1): Met  Skilled Therapeutic Interventions/Progress Updates:    1:1. Pt initially present to participate in yoga session, however wife also there and all parties agreeable to beginning family training hands on. Wife shows OT picure of shower. Pt with walk in shower with door. OT demo walk in shower transfer using posterior method and RW with LUE orthosis. Pt amb w/c>BSC in shower stall, however after demo significant ataxia with stepping, OT recommending only stand pivots with pt d/t poor control of turns/backwards stepping. OT discussed use of transport chair to get into doorway of bathroom to be able to stand pivot to shower. Discussed non slip footwear needed at all times to improve safety with mobility as well as use of gait belt/robe into/out of shower. Wife verbalized understanding. Pt and wife practiced hands on transfers w/c<>mat table with MIN A and guarding A from OT with wife cuing for hand placement as well as counting to initiate transfers. Pt wife continues to need cuing for proximity to pt and following with pt as pt turns for improved control of assist PRN. Returned to room and provided pt with elastic laces for LB  dressing. Wife provides A to SPT back to bed. Exited session with pt seated in bed, exit alarm on and call light in reach   Therapy Documentation Precautions:  Precautions Precautions: Fall Precaution Comments: NG tube, trach, PMSV, R hemiparesis, hx of DVTs and PEs Other Brace: BUE resting hand splints, see schedule Restrictions Weight Bearing Restrictions: No General:   Vital Signs: Therapy Vitals Temp: 98.2 F (36.8 C) Temp Source: Oral Pulse Rate: 77 Resp: 18 BP: 131/77 Patient Position (if appropriate): Lying Oxygen Therapy SpO2: 99 % O2 Device: Room Air Pain:   ADL: ADL Eating: Not assessed Grooming: Moderate assistance Where Assessed-Grooming: Edge of bed Upper Body Bathing: Moderate assistance Where Assessed-Upper Body Bathing: Edge of bed Lower Body Bathing: Dependent (+2 assist using Stedy for sit<stand) Where Assessed-Lower Body Bathing: Edge of bed,Other (Comment) (also sit<stand from [facility] using Stedy) Upper Body Dressing: Maximal assistance Lower Body Dressing: Dependent (+2 assist using Stedy) Where Assessed-Lower Body Dressing: Edge of bed,Other (Comment) (sit<stand from [facility] using Stedy) Toileting: Dependent (+2 using Stedy) Where Assessed-Toileting: Bedside Commode Toilet Transfer: Dependent (+2 using Stedy) Toilet Transfer Method: Arts development officer: Radiographer, therapeutic: Not assessed Vision   Perception    Praxis   Exercises:   Other Treatments:     Therapy/Group: Individual Therapy  Tonny Branch 11/18/2020, 6:54 AM

## 2020-11-19 NOTE — Progress Notes (Signed)
Physical Therapy Session Note  Patient Details  Name: Walter [name] MRN: [id number] Date of Birth: 07/07/1955  Today's Date: 11/19/2020 PT Individual Time: 0910-1005 PT Individual Time Calculation (min): 55 min   Short Term Goals: Week 3:  PT Short Term Goal 1 (Week 3): Pt will perform SPVT transfers using LRAD with consistent Min A. PT Short Term Goal 2 (Week 3): Pt will ambulate at least 21ft using LRAD with +2 Min A with wide BOS. PT Short Term Goal 3 (Week 3): pt will demonstrate dynamic standing balance with Min A/ CGA. PT Short Term Goal 4 (Week 3): Pt will propel w/c 150 feet with straight path and navigate turns with CGA.  Skilled Therapeutic Interventions/Progress Updates:     Patient in bed upon PT arrival. Patient alert and agreeable to PT session. Patient reported and perseverated on R foot pain between his second and third metatarsals during session, RN made aware. PT provided repositioning, rest breaks, and distraction as pain interventions throughout session.   Patient demonstrated impulsivity and decreased safety awareness throughout session. Able to follow cues to wait for therapist prior to initiating mobility 100% of the time.   Therapeutic Activity: Bed Mobility: Donned B TED hose with total A bed level prior to mobility. Patient performed supine to sit with supervision in a flat bed without use of bed rail. Provided verbal cues for placing B feet on the floor. Donned B tennis shoes with max-mod A with use of elastic laces. Loosened R laces to reduce foot pain.  Transfers: Patient performed sit to/from stand x1 and stand pivot bed<>w/c and w/c>recliner with min A using RW. Provided verbal cues for strapping his R hand in the hand splint, forward weight shift, and controled stepping and management of AD with facilitation for safety. Patient performed sit to/from stand x3 without AD with min A, with increased posterior bias, facilitated with hand on shoulder to reduce back  arching into extension. Patient performed a toilet transfer w/c<>toilet with grab bar with mod A due to decreased safety awareness with patient attempting to manage pants and letting go of grab bar during transfer. Provided heavy cues for safety and sequencing. Patient was continent of bowl and bladder during toileting, performed peri-care independently with PT follow-up for thoroughness, and lower body clothing management with max A.  Gait Training:  Patient ambulated 6 feet, 50 feet, and 55 feet with hands on therapist's shoulders for increased balance challenge and to promote forward propulsion due to posterior bias with mod A, max A x2 due to R LOB leading to pivot into sitting EOM. He then ambulated 45 feet using RW with R hand splint with min progressing to mod A due to fatigue. Ambulated with R lower extremity ataxia, B knee flexion in stance, decreased R weight shift with poor L foot clearance, and  increased trunk extension. Provided verbal cues for abdominal activation to reduce trunk extension, R weight shift to clear L foot in swing, and increased gluteal and eccentric hamstring activation in stance for reduced knee flexion.  Wheelchair Mobility:  Patient propelled wheelchair >150 feet and >100 feet with supervision and intermittent min A for steering and slowing down on turns due to impulsivity using B lower extremities. Provided verbal cues for controlled stepping, slowing on turns, and erect posture to increase hamstring activation and leverage during propulsion.  Patient in recliner in the room at end of session with breaks locked, seat belt alarm set, Telesitter in place and all needs within reach, RN  made aware.   Therapy Documentation Precautions:  Precautions Precautions: Fall Precaution Comments: NG tube, trach, PMSV, R hemiparesis, hx of DVTs and PEs Other Brace: BUE resting hand splints, see schedule Restrictions Weight Bearing Restrictions: No   Therapy/Group: Individual  Therapy  Hrishikesh Hoeg L Kennia Vanvorst PT, DPT  11/19/2020, 12:35 PM

## 2020-11-19 NOTE — Progress Notes (Signed)
Occupational Therapy Session Note  Patient Details  Name: Walter [name] MRN: [id number] Date of Birth: Nov 03, 1955  Today's Date: 11/19/2020 OT Individual Time: 1405-1501 OT Individual Time Calculation (min): 56 min   Skilled Therapeutic Interventions/Progress Updates:    Pt greeted in the recliner with wife, [name], present for family education. She mostly observed and asked questions. Pt agreeable to engage in bathing/dressing tasks today. OT set up a BSC in the room shower, per method they will implement at home. Stand pivot<w/c completed using RW with Mod A from therapist and CGA from [facility]. Used the grab bars for stand pivot<shower with Mod A. Pt then bathed while seated with vcs to sit vs stand at appropriate times, Min balance assist during dynamic sitting due to impulsivity. Assistance for thoroughness when washing buttocks and feet. Returned to the w/c to proceed with dressing tasks at sit<stand level using his RW. Educated pt to draw 1 leg up close to body or utilize figure 4 position to don footwear to increase his safety, tends to prefer bending forward. Educated Kathy on adaptive method for donning thigh high Teds. Both [name] and pt are wondering if he will need thigh high Teds for home, advised them to ask MD as there is an MD order for thigh high Teds. Trach site covered prior to shower after consultation with RN. At end of session pt remained sitting up in the w/c, all needs within reach and safety belt fastened.   Therapy Documentation Precautions:  Precautions Precautions: Fall Precaution Comments: NG tube, trach, PMSV, R hemiparesis, hx of DVTs and PEs Other Brace: BUE resting hand splints, see schedule Restrictions Weight Bearing Restrictions: No Pain: gout pain in the Rt foot, noted some swelling. Notified RN.    ADL: ADL Eating: Not assessed Grooming: Moderate assistance Where Assessed-Grooming: Edge of bed Upper Body Bathing: Moderate assistance Where Assessed-Upper  Body Bathing: Edge of bed Lower Body Bathing: Dependent (+2 assist using Stedy for sit<stand) Where Assessed-Lower Body Bathing: Edge of bed,Other (Comment) (also sit<stand from [facility] using Stedy) Upper Body Dressing: Maximal assistance Lower Body Dressing: Dependent (+2 assist using Stedy) Where Assessed-Lower Body Dressing: Edge of bed,Other (Comment) (sit<stand from [facility] using Stedy) Toileting: Dependent (+2 using Stedy) Where Assessed-Toileting: Bedside Commode Toilet Transfer: Dependent (+2 using Stedy) Toilet Transfer Method: Surveyor, minerals: Animator Transfer: Not assessed      Therapy/Group: Individual Therapy  Lorris Carducci A Kathlyn Leachman 11/19/2020, 3:38 PM

## 2020-11-20 LAB — CBC WITH DIFFERENTIAL/PLATELET
Abs Immature Granulocytes: 0.01 10*3/uL (ref 0.00–0.07)
Basophils Absolute: 0 10*3/uL (ref 0.0–0.1)
Basophils Relative: 1 %
Eosinophils Absolute: 0.1 10*3/uL (ref 0.0–0.5)
Eosinophils Relative: 3 %
HCT: 37.2 % — ABNORMAL LOW (ref 39.0–52.0)
Hemoglobin: 12 g/dL — ABNORMAL LOW (ref 13.0–17.0)
Immature Granulocytes: 0 %
Lymphocytes Relative: 29 %
Lymphs Abs: 1.2 10*3/uL (ref 0.7–4.0)
MCH: 32.3 pg (ref 26.0–34.0)
MCHC: 32.3 g/dL (ref 30.0–36.0)
MCV: 100 fL (ref 80.0–100.0)
Monocytes Absolute: 0.3 10*3/uL (ref 0.1–1.0)
Monocytes Relative: 7 %
Neutro Abs: 2.6 10*3/uL (ref 1.7–7.7)
Neutrophils Relative %: 60 %
Platelets: 290 10*3/uL (ref 150–400)
RBC: 3.72 MIL/uL — ABNORMAL LOW (ref 4.22–5.81)
RDW: 14.7 % (ref 11.5–15.5)
WBC: 4.4 10*3/uL (ref 4.0–10.5)
nRBC: 0 % (ref 0.0–0.2)

## 2020-11-20 LAB — BASIC METABOLIC PANEL
Anion gap: 8 (ref 5–15)
BUN: 6 mg/dL — ABNORMAL LOW (ref 8–23)
CO2: 26 mmol/L (ref 22–32)
Calcium: 9.2 mg/dL (ref 8.9–10.3)
Chloride: 102 mmol/L (ref 98–111)
Creatinine, Ser: 0.8 mg/dL (ref 0.61–1.24)
GFR, Estimated: >60 mL/min (ref >60)
Glucose, Bld: 116 mg/dL — ABNORMAL HIGH (ref 70–99)
Potassium: 3.8 mmol/L (ref 3.5–5.1)
Sodium: 136 mmol/L (ref 135–145)

## 2020-11-20 NOTE — Progress Notes (Signed)
Occupational Therapy Session Note  Patient Details  Name: Walter [name] MRN: [id number] Date of Birth: 22-Apr-1956  Today's Date: 11/20/2020 OT Individual Time: 0902-1004 OT Individual Time Calculation (min): 62 min    Short Term Goals: Week 1:  OT Short Term Goal 1 (Week 1): Pt will complete BSC or toilet transfer with 1 assist and LRAD OT Short Term Goal 1 - Progress (Week 1): Not met OT Short Term Goal 2 (Week 1): Pt will engage in self care or therapeutic activity while sitting unsupported for ~20 minutes with no more than Mod balance assistance OT Short Term Goal 2 - Progress (Week 1): Met OT Short Term Goal 3 (Week 1): Pt will be able to visually scan for 1 ADL item and successfully reach for it with no more than Mod A OT Short Term Goal 3 - Progress (Week 1): Met Week 2:  OT Short Term Goal 1 (Week 2): Pt will complete STS in prep for standing ADL with mod A of 1. OT Short Term Goal 1 - Progress (Week 2): Met OT Short Term Goal 2 (Week 2): Pt will complete seated grooming with min A for task completion. OT Short Term Goal 2 - Progress (Week 2): Met OT Short Term Goal 3 (Week 2): Pt will complete BSC or toilet transfer with 1 assist and LRAD OT Short Term Goal 3 - Progress (Week 2): Met  Skilled Therapeutic Interventions/Progress Updates:    Pt received in bed and consented to OT tx. Pt reports he is depressed as the MD just told him he will never be able to paddleboard or ski again. Therapeutic use of self utilized during session. Pt educated current safety concerns on potential activity/leisure modifications once home. Pt assisted with TED hose donning while in bed, refused RLE TED hose as he reports it irritates his foot. sat EOB with SUP and min A for SPT with RW from EOB to w/c. Pt required cuing for safety and hand placement throughout transitions. Pt completed oral hygiene seated sink side with setup. Pt taken down to gym, completed BITs board activity for visual scanning,  sequencing, and alternating BUEs for functional reach and standing balance. Pt completed activity while standing with RW with CGA- min A due to decreased balance. Pt instructed in 9 hole peg test, scored 1:76mns on R side, 2:04 mins on L side. Pt continues to demo ataxic movements and rushes through things with cuing for safety awareness throughout session. Pt handed off to PT end of session.  Therapy Documentation Precautions:  Precautions Precautions: Fall Precaution Comments: NG tube, trach, PMSV, R hemiparesis, hx of DVTs and PEs Other Brace: BUE resting hand splints, see schedule Restrictions Weight Bearing Restrictions: No Pain: Pain Assessment Pain Scale: 0-10 Pain Score: 0-No pain   Therapy/Group: Individual Therapy  Nichols Corter 11/20/2020, 9:20 AM

## 2020-11-20 NOTE — Progress Notes (Signed)
Physical Therapy Session Note  Patient Details  Name: Walter [name] MRN: [id number] Date of Birth: Nov 20, 1955  Today's Date: 11/20/2020 PT Individual Time: 1004-1100 PT Individual Time Calculation (min): 56 min   Short Term Goals: Week 3:  PT Short Term Goal 1 (Week 3): Pt will perform SPVT transfers using LRAD with consistent Min A. PT Short Term Goal 2 (Week 3): Pt will ambulate at least 75ft using LRAD with +2 Min A with wide BOS. PT Short Term Goal 3 (Week 3): pt will demonstrate dynamic standing balance with Min A/ CGA. PT Short Term Goal 4 (Week 3): Pt will propel w/c 150 feet with straight path and navigate turns with CGA.  Skilled Therapeutic Interventions/Progress Updates:    Pt received seated in w/c in room handed off from OT session. Per OT and patient he is more depressed this AM due to MD stating he will not be able to return to paddleboard. Provided emotional support to patient throughout session. Pt is Supervision for w/c mobility with use of BLE with cues for decreased speed and increased safety as LLE occasional catches under w/c and pt unaware. Pt is min A for w/c mobility with use of BUE with cues for correct propulsion technique. Sit to stand with min A to RW throughout session with cues for sequencing of transfer and for safety. Stand pivot transfer w/c to/from mat table with RW and min A, one instance of LOB requiring min A to slow descent to sit back on mat table. Pt had become confused during transfer regarding which direction he was supposed to be turning and was not following cues for sequencing, lost balance posteriorly. Standing alt L/R 4" step-taps with RW and min A for balance with cues for decreased speed and improved control of movements. [name] [name] arrives during session, has questions regarding upcoming d/c including how to safely get patient in/out of the home, equipment he will d/c home with, etc. Per chart primary PT is recommending use of 18x18 w/c and RW  but pt has not yet practiced stairs. Ascend/descend 2 x 3" stairs with 2 handrails and min A for balance, max cueing for safety as pt frequently impulsive. Ascend/descend 2 x 6" stairs with 2 handrails and min A for balance. Ascend/descend 4 x 6" stairs laterally with L handrail to simulate home environment, min A for balance and mod cueing for safety. Sit to/from supine on flat bed at Supervision level. Pt requests to remain in bed at end of session, needs in reach, bed alarm in place. [name] able to observe therapist this session on how to assist patient with transfers, bed mobility, and stairs, would benefit from hands-on practice leading up to d/c home later this week.  Therapy Documentation Precautions:  Precautions Precautions: Fall Precaution Comments: NG tube, trach, PMSV, R hemiparesis, hx of DVTs and PEs Other Brace: BUE resting hand splints, see schedule Restrictions Weight Bearing Restrictions: No   Therapy/Group: Individual Therapy   Peter Congo, PT, DPT, CSRS  11/20/2020, 11:45 AM

## 2020-11-20 NOTE — Progress Notes (Signed)
Nutrition Follow-up  DOCUMENTATION CODES:   Non-severe (moderate) malnutrition in context of acute illness/injury  INTERVENTION:  ContinueEnsure Enlive poTID, each supplement provides 350 kcal and 20 grams of protein.  ContinueMagic cupBID with meals, each supplement provides 290 kcal and 9 grams of protein.  Encourage PO intake.  NUTRITION DIAGNOSIS:   Moderate Malnutrition related to acute illness (intraparencymal hemorrhage) as evidenced by mild fat depletion,moderate muscle depletion,percent weight loss (7.6% weight loss in less than 3 months); ongoing  GOAL:   Patient will meet greater than or equal to 90% of their needs; met  MONITOR:   PO intake,Supplement acceptance,Skin,Weight trends,Labs,I & O's  REASON FOR ASSESSMENT:   Consult Enteral/tube feeding initiation and management  ASSESSMENT:   65 year old male with PMH of atrial fibrillation with ablation. Presented 09/17/20 with right-sided weakness, headache, dysarthria, and vomiting. Cranial CT/MRI showed acute intraparenchymal hemorrhage. Pt required emergent intubation for airway protection. EVD was placed per neurosurgery. Pt underwent tracheostomy 10/04/20. Pt initially NPO and is now on a dysphagia 3 diet with thin liquids.  Diet advanced to a regular diet with thin liquids. Meal completion has been 85-100%. Pt has been tolerating his PO well. Pt currently has Ensure ordered and has been consuming them. RD to continue with current orders to aid in caloric and protein needs.   Labs and medications reviewed.   Diet Order:   Diet Order            Diet regular Room service appropriate? Yes; Fluid consistency: Thin  Diet effective 1000                 EDUCATION NEEDS:   No education needs have been identified at this time  Skin:  Skin Assessment: Reviewed RN Assessment Skin Integrity Issues:: Other (Comment),Incisions,Stage I Stage I: left head Incisions: neck Other: MASD rectum, puncture to left  groin, right knee, right neck  Last BM:  5/21  Height:   Ht Readings from Last 1 Encounters:  09/25/20 6' 2" (1.88 m)    Weight:   Wt Readings from Last 1 Encounters:  11/20/20 91 kg   BMI:  Body mass index is 25.76 kg/m.  Estimated Nutritional Needs:   Kcal:  2300-2500  Protein:  115-130 grams  Fluid:  > 2.0 L  Corrin Parker, MS, RD, LDN RD pager number/after hours weekend pager number on Am[employer].

## 2020-11-20 NOTE — Progress Notes (Signed)
Speech Language Pathology Daily Session Note  Patient Details  Name: Walter [name] MRN: [id number] Date of Birth: 09-Apr-1956  Today's Date: 11/20/2020 SLP Individual Time: 1300-1330 SLP Individual Time Calculation (min): 30 min  Short Term Goals: Week 3: SLP Short Term Goal 1 (Week 3): STGs=LTGs due to ELOS  Skilled Therapeutic Interventions: Skilled treatment session focused on dysphagia goals and completion of family education. SLP facilitated session by providing skilled observation with trial tray of regular textures with lunch meal of regular textures. Patient demonstrated efficient mastication with supervision level verbal cues for use of swallowing compensatory strategies. No overt s/s of aspiration noted. Recommend patient upgrade to regular textures. Patient's wife present and educated regarding diet recommendations, appropriate textures, swallowing compensatory strategies and medication administration. She verbalized understanding. Patient left upright in bed with alarm on and all needs within reach. Continue with current plan of care.      Pain No/Denies Pain   Therapy/Group: Individual Therapy  Glendel Jaggers 11/20/2020, 2:41 PM

## 2020-11-20 NOTE — Progress Notes (Signed)
PROGRESS NOTE   Subjective/Complaints:  No issues overnite, discussed some longer range goals (will I be able to do stand up paddleboard?)  DIscussed that current focus is to improve ambulation with walker and reduce fall risk , return to paddleboard unlikely   ROS: + Visual disturbance.  Denies CP, SOB, N/V/D  Objective:   No results found. Recent Labs    11/20/20 0621  WBC 4.4  HGB 12.0*  HCT 37.2*  PLT 290   Recent Labs    11/20/20 0621  NA 136  K 3.8  CL 102  CO2 26  GLUCOSE 116*  BUN 6*  CREATININE 0.80  CALCIUM 9.2    Intake/Output Summary (Last 24 hours) at 11/20/2020 0739 Last data filed at 11/20/2020 0500 Gross per 24 hour  Intake 531 ml  Output 1150 ml  Net -619 ml     Pressure Injury 10/21/20 Head Left;Posterior Stage 1 -  Intact skin with non-blanchable redness of a localized area usually over a bony prominence. Pink non-blanchable scalp with thinning hair on posterior left side of head (Active)  10/21/20 1100  Location: Head  Location Orientation: Left;Posterior  Staging: Stage 1 -  Intact skin with non-blanchable redness of a localized area usually over a bony prominence.  Wound Description (Comments): Pink non-blanchable scalp with thinning hair on posterior left side of head  Present on Admission: No    Physical Exam: Vital Signs Blood pressure 134/77, pulse 80, temperature 98.6 F (37 C), temperature source Oral, resp. rate 17, weight 91 kg, SpO2 97 %. ENT- healed trach site    General: No acute distress Mood and affect are appropriate Heart: Regular rate and rhythm no rubs murmurs or extra sounds Lungs: Clear to auscultation, breathing unlabored, no rales or wheezes Abdomen: Positive bowel sounds, soft nontender to palpation, nondistended Extremities: No clubbing, cyanosis, or edema Skin: No evidence of breakdown, no evidence of rash Neurologic: Cranial nerves II through XII intact,  motor strength is 5/5 in bilateral deltoid, bicep, tricep, grip, hip flexor, knee extensors, ankle dorsiflexor and plantar flexor  Cerebellar exam moderately severe dysmetria Left  finger to nose to finger as well as heel to shin in bilateral upper and lower extremities Musculoskeletal: Full range of motion in all 4 extremities. No joint swelling    Assessment/Plan: 1. Functional deficits which require 3+ hours per day of interdisciplinary therapy in a comprehensive inpatient rehab setting.  Physiatrist is providing close team supervision and 24 hour management of active medical problems listed below.  Physiatrist and rehab team continue to assess barriers to discharge/monitor patient progress toward functional and medical goals  Care Tool:  Bathing    Body parts bathed by patient: Right arm,Left arm,Chest,Face,Right upper leg,Left upper leg,Abdomen,Front perineal area,Right lower leg,Left lower leg   Body parts bathed by helper: Buttocks,Right lower leg,Left lower leg     Bathing assist Assist Level: Minimal Assistance - Patient > 75%     Upper Body Dressing/Undressing Upper body dressing   What is the patient wearing?: Pull over shirt    Upper body assist Assist Level: Minimal Assistance - Patient > 75%    Lower Body Dressing/Undressing Lower body dressing  What is the patient wearing?: Pants,Underwear/pull up     Lower body assist Assist for lower body dressing: Moderate Assistance - Patient 50 - 74%     Toileting Toileting    Toileting assist Assist for toileting: Moderate Assistance - Patient 50 - 74%     Transfers Chair/bed transfer  Transfers assist  Chair/bed transfer activity did not occur: Safety/medical concerns  Chair/bed transfer assist level: Minimal Assistance - Patient > 75%     Locomotion Ambulation   Ambulation assist   Ambulation activity did not occur: Safety/medical concerns  Assist level: Moderate Assistance - Patient 50 -  74% Assistive device: No Device Max distance: 55 ft   Walk 10 feet activity   Assist  Walk 10 feet activity did not occur: Safety/medical concerns  Assist level: Moderate Assistance - Patient - 50 - 74% Assistive device: No Device   Walk 50 feet activity   Assist Walk 50 feet with 2 turns activity did not occur: Safety/medical concerns  Assist level: Moderate Assistance - Patient - 50 - 74% Assistive device: No Device    Walk 150 feet activity   Assist Walk 150 feet activity did not occur: Safety/medical concerns         Walk 10 feet on uneven surface  activity   Assist Walk 10 feet on uneven surfaces activity did not occur: Safety/medical concerns         Wheelchair     Assist Will patient use wheelchair at discharge?:  (TBD) Type of Wheelchair: Manual    Wheelchair assist level: Minimal Assistance - Patient > 75%,Supervision/Verbal cueing Max wheelchair distance: >150 ft    Wheelchair 50 feet with 2 turns activity    Assist        Assist Level: Minimal Assistance - Patient > 75%,Supervision/Verbal cueing   Wheelchair 150 feet activity     Assist      Assist Level: Minimal Assistance - Patient > 75%,Supervision/Verbal cueing   Blood pressure 134/77, pulse 80, temperature 98.6 F (37 C), temperature source Oral, resp. rate 17, weight 91 kg, SpO2 97 %.  Medical Problem List and Plan: 1. Right side hemiparesis and slurred speech secondary to intracerebral hemorrhage complicated by hydrocephalus. Status post EVD placed per neurosurgery  Continue CIR 2. Acute PE/bilateral lower extremity DVT: Status post IVC filter 10/08/2020 with thrombectomy and retrieval of IVC filter 10/28/2020 per interventional radiology (still has suprarenal filter ) No RLE, scheduled for repeat thrombectomy to debulk clot from filter was successful no signs of post phlebitic syndrome -DVT/anticoagulation: Continue Eliquis-    CBC stable   5/7- per IR< also  needs SCDs at all times in bed- IR went over with nursing. -antiplatelet therapy: N/A 3. Pain Management: Decrease oxycodone to daily prn  Controlled on 5/21 4. Mood: Amantadine 100 mg twice daily, will d/c -antipsychotic agents: N/A 5. Neuropsych: This patient is not capable of making decisions on his own behalf. 6. Skin/Wound Care: Routine skin checks 7. Fluids/Electrolytes/Nutrition: Routine in and outs with follow-up chemistries 8. PAF. Status post ablation cardioversion in the Past. Continue Eliquis. Cardio exam 30 mg every 6 hours. Cardiac rate controlled diltiazem to 30mg   TID  Vitals:   11/19/20 1920 11/20/20 0415  BP: 136/70 134/77  Pulse: 79 80  Resp: 16 17  Temp: 97.8 F (36.6 C) 98.6 F (37 C)  SpO2: 96% 97%   Controlled on 5/23 9. Acute hypoxic respiratory failure. Resolved Tracheostomy 10/04/2020 decannulated 11/09/20  10. Hospital-acquired pneumonia/ventilator associated pneumonia. Infectious disease follow-up  antibiotic therapy completed- resolved  11. Post stroke dysphagia. Continue Dysphagia #3 thin liquids 12. Hyperglycemia related to tube feeds. Resolved, off lantus  CBG (last 3)  No results for input(s): GLUCAP in the last 72 hours.will d/c CBG not diabetic will be off TF- CBGs nl- d/ced 13.  RIght foot pain resolved  14.  Sleep disturbance  Reduced trazodone after discussion with Phrmacy regarding potential for bradycardia   Improved 15.  Dysuria: UA negative, UC with multiple species  LOS: 23 days A FACE TO FACE EVALUATION WAS PERFORMED  Erick Colace 11/20/2020, 7:39 AM

## 2020-11-20 NOTE — Progress Notes (Signed)
Orthopedic Tech Progress Note Patient Details:  OSSIE BELTRAN 1955/12/28 [id number] Called in order to [facility] for a THIGH HIGH COMPRESSION STOCKING Patient ID: MACINTYRE ALEXA, male   DOB: 02-28-1956, 65 y.o.   MRN: [id number]   [name] 11/20/2020, 3:05 PM

## 2020-11-20 NOTE — Progress Notes (Signed)
Called to room by speech and language pathologist due to patient and wife is concerned about right foot pain Right foot pain per patient has been a chronic issue he has had Morton's neuroma removal by Dr. Charlsie Merles.  He states that compression hose is causing worsening of that pain.  We discussed the recommendation to wear thigh-high hose due to history of DVT with a partially blocked IVC filter.  Will see if [facility] can get custom thigh-high hose that does not include the right forefoot.

## 2020-11-20 NOTE — Progress Notes (Signed)
Occupational Therapy Session Note  Patient Details  Name: Walter [name] MRN: [id number] Date of Birth: [birth year]  Today's Date: 11/20/2020 OT Individual Time: 1449-1535 OT Individual Time Calculation (min): 46 min   Short Term Goals: Week 3:  OT Short Term Goal 1 (Week 3): STG = LTG 2/2 ELOS  Skilled Therapeutic Interventions/Progress Updates:    Pt greeted in bed, asleep, easily woken and motivated to participate in tx. He donned pants bedlevel with increased time and vcs. He transitioned to EOB unassisted and then donned his sneakers with Min balance assist for safety. Transitioned to core strengthening/Lt UE coordination by anterior reaches to target, crossing midline and bending outside of base of support while sitting EOB. Towel folding task completed with focus on bilateral coordination, vcs for increased precision on the Lt side. He then asked to go outside to see the rain for a bit. Mod A for stand pivot<w/c using RW with vcs. He was escorted to the outdoor patio and while watching the rain, engaged in additional bilateral coordination activities with emphasis on improving coordination of the Lt UE. When pt was returned to the room after he completed a stand pivot transfer to the recliner using RW with the same assistance as stated above. He remained sitting up, all needs within reach and safety belt fastened, daughter just arriving to visit.   Therapy Documentation Precautions:  Precautions Precautions: Fall Precaution Comments: NG tube, trach, PMSV, R hemiparesis, hx of DVTs and PEs Other Brace: BUE resting hand splints, see schedule Restrictions Weight Bearing Restrictions: No Vital Signs: Therapy Vitals Temp: 98 F (36.7 C) Pulse Rate: 96 Resp: 16 BP: 137/73 Oxygen Therapy SpO2: 96 % Pain: no c/o pain during tx   ADL: ADL Eating: Not assessed Grooming: Moderate assistance Where Assessed-Grooming: Edge of bed Upper Body Bathing: Moderate assistance Where  Assessed-Upper Body Bathing: Edge of bed Lower Body Bathing: Dependent (+2 assist using Stedy for sit<stand) Where Assessed-Lower Body Bathing: Edge of bed,Other (Comment) (also sit<stand from [facility] using Stedy) Upper Body Dressing: Maximal assistance Lower Body Dressing: Dependent (+2 assist using Stedy) Where Assessed-Lower Body Dressing: Edge of bed,Other (Comment) (sit<stand from [facility] using Stedy) Toileting: Dependent (+2 using Stedy) Where Assessed-Toileting: Bedside Commode Toilet Transfer: Dependent (+2 using Stedy) Toilet Transfer Method: Surveyor, minerals: Animator Transfer: Not assessed      Therapy/Group: Individual Therapy  [name] A Carlon Davidson 11/20/2020, 4:19 PM

## 2020-11-21 NOTE — Discharge Summary (Signed)
Physician Discharge Summary  Patient ID: Walter [name] MRN: [id number] DOB/AGE: Nov 18, 1955 65 y.o.  Admit date: 10/28/2020 Discharge date: 11/23/2020  Discharge Diagnoses:  Principal Problem:   ICH (intracerebral hemorrhage) (HCC) Active Problems:   Tracheostomy care (HCC)   Malnutrition of moderate degree   Thrombosis   Sleep disturbance   Dysphagia, post-stroke   PAF (paroxysmal atrial fibrillation) (HCC)   Acute pulmonary embolism without acute cor pulmonale (HCC) Bilateral lower extremity DVT  Discharged Condition: Stable  Significant Diagnostic Studies: CT HEAD WO CONTRAST  Result Date: 10/24/2020 CLINICAL DATA:  Acute neuro deficit. Unequal pupils. Double vision. History of intracranial hemorrhage in shunt EXAM: CT HEAD WITHOUT CONTRAST TECHNIQUE: Contiguous axial images were obtained from the base of the skull through the vertex without intravenous contrast. COMPARISON:  CT head 10/17/2020.  MRI head 10/06/2020 FINDINGS: Brain: Resolving hemorrhage and edema along the right frontal ventricular shunt tract. Ventricle size normal and stable. CSF density cavity in the left posterior midbrain at the site of prior hemorrhage. This is stable from the prior CT. No new area of hemorrhage or infarction compared with recent studies. Vascular: Negative for hyperdense vessel Skull: Right parietal burr hole over the convexity. No acute skeletal abnormality. Sinuses/Orbits: Paranasal sinuses clear. Bilateral mastoid effusion. Negative orbit. NG tube in place. Other: None IMPRESSION: Ventricle size remains normal. Improving hemorrhage and edema along the shunt tract in the right frontal lobe. Stable cystic encephalomalacia left posterior midbrain at site of prior hemorrhage. No new area of hemorrhage or infarct compared with recent studies. Electronically Signed   By: Marlan Palau M.D.   On: 10/24/2020 20:01   IR Veno/Ext/Uni Right  Result Date: 11/03/2020 INDICATION: 65 year old male with  history of recent hemorrhagic stroke status post IVC filter placement on 10/08/2020 with subsequent propagation of right greater than left lower extremity deep vein thrombosis with ileocaval involvement and near occlusion of the inferior vena cava. Status post suction thrombectomy on 10/27/2020 with inadequate clearance of iliocaval thrombus. EXAM: 1. Ultrasound-guided venous access of the right popliteal vein. 2. Ultrasound-guided venous access of the right internal jugular vein. 3. Right lower extremity venogram. 4. Inferior vena cavagram. 5. Intravascular ultrasound. 6. Mechanical thrombectomy of the inferior vena cava, right internal and external iliac, common femoral, and femoral veins. 7. Suction thrombectomy of the inferior vena cava, right internal and external iliac, common femoral, and femoral veins. 8. Balloon angioplasty of the right femoral and common femoral veins. COMPARISON:  10/26/2020, 10/27/2020 MEDICATIONS: 13000 units heparin, intravenous ANESTHESIA/SEDATION: Versed 5 mg IV; Fentanyl 250 mcg IV Moderate Sedation Time:  2 hours, 45 minutes The patient was continuously monitored during the procedure by the interventional radiology nurse under my direct supervision. FLUOROSCOPY TIME:  Fluoroscopy Time: 25 minutes 18 seconds (81 mGy). CONTRAST:  75 mL Omnipaque 300, intravenous COMPLICATIONS: None immediate. TECHNIQUE: Informed written consent was obtained from the patient after a thorough discussion of the procedural risks, benefits and alternatives. All questions were addressed. Maximal Sterile Barrier Technique was utilized including caps, mask, sterile gowns, sterile gloves, sterile drape, hand hygiene and skin antiseptic. A timeout was performed prior to the initiation of the procedure. The bilateral lower extremities and right neck were prepped and draped in standard fashion. Under direct ultrasound visualization, the distal right popliteal vein was identified with the patient's leg in  frog-leg position which demonstrated mixed acute and subacute thrombus. Subdermal local anesthesia was administered at the planned needle entry site. A skin nick was made. Under direct ultrasound visualization, the  distal popliteal vein was accessed with a 21 gauge micropuncture needle. A 0.018 inch Nitrex wire was then directed into the femoral vein with minimal resistance. A micropuncture sheath was placed. Serial dilation was performed and an 8 [place], 11 cm vascular sheath was placed. A Glidewire was directed into the suprarenal inferior vena cava and exchanged over a 5 French catheter for a superstiff exchange length Amplatz wire. Right lower extremity venogram was then performed with hand injection. Venogram was significant for nearly occlusive thrombus throughout the visualized femoral vein and common femoral vein. There was sluggish flow through the external and common iliac veins into the inferior vena cava where there is peripheral flow around large residual thrombus burden within and inferior to the indwelling inferior vena cava. Intravascular ultrasound was performed which demonstrated similar findings to venogram: Echogenic, expansile thrombus throughout the visualized femoral vein into the common femoral vein, patent external iliac vein, wall adherent echogenic thrombus within the common iliac vein, patent inferior aspect of the inferior vena cava and multifocal echogenic thrombus within the midportion of the infrarenal inferior vena cava extending into the indwelling inferior vena cava filter which is well positioned. The IVC is widely patent superior to the filter. Next, the internal jugular vein was assessed with ultrasound and was compressible and free of internal echoes. Subdermal Local anesthesia was administered 1% lidocaine at the planned needle entry site. A skin nick was made. Under direct ultrasound visualization, a 21 gauge micropuncture needle was advanced into the internal jugular vein  and a micropuncture set was placed. Through this, a Wholey wire was directed to the inferior vena cava. Serial dilation was performed and a 16 French, 70 cm sheath was placed, directed under direct fluoroscopic guidance through the indwelling inferior vena cava filter. Then, from the thigh lower extremity access, an Amplatz wire was directed into the distal aspect of the sheath lumen and through and through flossing access was established. The indwelling 8 French sheath was then exchanged for a 16 [place] ClotTriever sheath after serial dilation under fluoroscopic guidance. The ClotTriever catheter was then advanced into the 16 French IJ sheath, the coring element was exposed, and mechanical thrombectomy was performed from the inferior vena cava through the femoral vein. A total of 6 passes with this technique were performed yielding large volume of acute and chronic appearing thrombus. Repeat venogram was performed through the indwelling popliteal sheath which demonstrated significantly improved patency inflow of the femoral vein and inferior vena cava. There is significant flow-limiting focal stenosis in the mid femoral vein and at the saphenofemoral junction. Intravascular ultrasound was then again performed which demonstrated significantly improved patency of the femoral vein with the exception of echogenic wall adherent thrombus in the mid femoral vein in addition to at the saphenofemoral junction. There is persistent echogenic thrombus just inferior to the indwelling inferior vena cava filter, decreased from comparison. Balloon angioplasty was then performed in the mid femoral vein and at the saphenofemoral junction with a 12 mm by 4 cm atlas balloon. Repeat venogram demonstrated minimally improved patency at the 2 stenotic areas. Therefore, 2 additional passes with the ClotTriever device were performed from the common femoral vein through the femoral vein yielding small volume chronic appearing thrombus.  Repeat intravascular ultrasound and venogram demonstrated persistent flow limiting focal stenosis at the saphenofemoral junction and persistent chronic appearing thrombus within the inferior vena cava, just inferior to the indwelling filter. Therefore, the 76 [place] FlowTriever device was used to perform suction thrombectomy within the inferior aspect  of the filter as well as in the femoral vein at multiple sites. Multiple acute on chronic thrombi were aspirated from the inferior vena cava. There is minimal yield in the femoral vein. Repeat intravascular ultrasound venogram demonstrated persistent flow limiting stenosis at the saphenofemoral junction and improved thrombus burden in the inferior vena cava. Therefore, repeat balloon angioplasty of saphenofemoral junction and in the mid femoral vein was performed with a 14 mm x 4 cm atlas balloon with 1 minute inflation times. Completion venography and intravascular ultrasound was then performed. There is significantly improved patency of the mid femoral vein focal stenosis. There is persistent chronic appearing thrombus but improved patency at the saphenofemoral junction. The distal aspect of the inferior vena cava is widely patent. There is persistent small volume chronic appearing thrombus just inferior to the indwelling inferior vena cava filter. The superior aspect of the inferior vena cava remains widely patent. The catheters and sheath were then removed. A per string stitch was placed at the popliteal access site. Manual compression was applied at both sites for approximately 5 minutes and hemostasis was achieved. The patient tolerated the procedure well and was returned to the floor in stable condition. FINDINGS: Severe mixed acute and chronic deep vein thrombosis extending from the right calf to the indwelling inferior vena cava. Technically successful mechanical thrombectomy, suction thrombectomy, and balloon angioplasty. Small volume residual IVC and right  femoral chronic appearing thrombus, most prominent at the saphenofemoral junction. IMPRESSION: 1. Severe mixed acute and chronic deep vein thrombosis extending from the right calf to the indwelling inferior vena cava. 2. Technically successful mechanical thrombectomy, suction thrombectomy, and balloon angioplasty from the right femoral vein to the inferior vena cava. 3. Small volume residual inferior vena cava and right femoral chronic appearing thrombus. PLAN: 1. Bilateral thigh high compression stockings (20-30 mmHg) at all times. Continue compression stocking use after discharge. 2. Bilateral SCDs at all times unless ambulating. 3. Continue anticoagulation. 4. Follow up in IR clinic in 2 months with CTV abdomen/pelvis. Marliss Coots, MD [facility] Radiology Electronically Signed   By: Marliss Coots MD   On: 11/03/2020 08:38   IR Caffie Damme Ivc  Result Date: 10/30/2020 CLINICAL DATA:  Recent hemorrhagic stroke. Lower extremity DVT right worse than left, with IVC filter deployment 10/08/2020. Follow-up CT demonstrated iliocaval thrombosis. EXAM: THROMBOECTOMY MECHANICAL VENOUS; INFERIOR VENA CAVOGRAM; IR ULTRASOUND GUIDANCE VASC ACCESS LEFT ANESTHESIA/SEDATION: Intravenous Fentanyl and Versed 2mg  were administered as conscious sedation during continuous monitoring of the patient's level of consciousness and physiological / cardiorespiratory status by the radiology RN, with a total moderate sedation time of 105 minutes. MEDICATIONS: Lidocaine 1% subcutaneous CONTRAST:  OMNIPAQUE IOHEXOL 300 MG/ML SOLN, 30mL OMNIPAQUE IOHEXOL 300 MG/ML SOLN PROCEDURE: The procedure, risks (including but not limited to bleeding, infection, organ damage ), benefits, and alternatives were explained to the patient and spouse. Questions regarding the procedure were encouraged and answered. The spouse understands and consents to the procedure. Right IJ and bilateral femoral  regions prepped and draped in usual sterile fashion. Maximal barrier sterile technique was utilized including caps, mask, sterile gowns, sterile gloves, sterile drape, hand hygiene and skin antiseptic. The left common iliac vein was localized under ultrasound, patency documented. Under real-time ultrasound guidance, the vessel was accessed with a 21-gauge micropuncture needle, exchanged over a 018 guidewire for a transitional dilator. Venography was performed. Patient was given IV heparin boluses with intermittent ACT checks targeting 300. The dilator was exchanged over an Amplatz  wire for a [employer] French dry seal sheath. Through this, the 50 French embolectomy sheath was advanced. After confirmatory venography, the coaxial angled AngioVac F22 cannula was advanced for multiple embolectomy passes through the infrarenal IVC up to the margin of the previously placed filter, with intermittent venography and repositioning. After a total of 535 mL procedure-related blood loss had occurred, and based on the residual caval thrombus volume, the procedure was terminated. The venous sheath was removed and hemostasis achieved at the site with manual compression and a 2-0 Ethilon figure-8 suture. Dr. Loreta Ave assisted intermittently with the procedure. The patient tolerated the procedure well. COMPLICATIONS: None immediate FINDINGS: Ultrasound demonstrated patency of the left common femoral vein, without thrombus. Left iliac venography demonstrated no left iliac venous thrombus. There is nearly occlusive caval thrombosis from the iliac confluence through the IVC filter. After multiple image-directed passes of thrombectomy device, there is significant debulking of the infrarenal caval thrombus with restoration of antegrade flow through the IVC and filter. There is a moderately large residual caval thrombus volume, with some protruding above the tip of the filter. For this reason, filter retrieval was not entertained. IMPRESSION:  1. 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 2. Technically successful debulking of caval thrombus allowing restoration of antegrade flow through the infrarenal IVC and filter. 3. Embolectomy procedure terminated due to concomitant blood loss, such that a complete caval thrombectomy was not achieved, nor was right lower extremity thrombectomy attempted, and the IVC filter was left in place. Electronically Signed   By: Corlis Leak M.D.   On: 10/30/2020 09:19   IR THROMBECT VENO MECH MOD SED  Result Date: 11/03/2020 INDICATION: 65 year old male with history of recent hemorrhagic stroke status post IVC filter placement on 10/08/2020 with subsequent propagation of right greater than left lower extremity deep vein thrombosis with ileocaval involvement and near occlusion of the inferior vena cava. Status post suction thrombectomy on 10/27/2020 with inadequate clearance of iliocaval thrombus. EXAM: 1. Ultrasound-guided venous access of the right popliteal vein. 2. Ultrasound-guided venous access of the right internal jugular vein. 3. Right lower extremity venogram. 4. Inferior vena cavagram. 5. Intravascular ultrasound. 6. Mechanical thrombectomy of the inferior vena cava, right internal and external iliac, common femoral, and femoral veins. 7. Suction thrombectomy of the inferior vena cava, right internal and external iliac, common femoral, and femoral veins. 8. Balloon angioplasty of the right femoral and common femoral veins. COMPARISON:  10/26/2020, 10/27/2020 MEDICATIONS: 13000 units heparin, intravenous ANESTHESIA/SEDATION: Versed 5 mg IV; Fentanyl 250 mcg IV Moderate Sedation Time:  2 hours, 45 minutes The patient was continuously monitored during the procedure by the interventional radiology nurse under my direct supervision. FLUOROSCOPY TIME:  Fluoroscopy Time: 25 minutes 18 seconds (81 mGy). CONTRAST:  75 mL Omnipaque 300, intravenous COMPLICATIONS: None immediate. TECHNIQUE: Informed written  consent was obtained from the patient after a thorough discussion of the procedural risks, benefits and alternatives. All questions were addressed. Maximal Sterile Barrier Technique was utilized including caps, mask, sterile gowns, sterile gloves, sterile drape, hand hygiene and skin antiseptic. A timeout was performed prior to the initiation of the procedure. The bilateral lower extremities and right neck were prepped and draped in standard fashion. Under direct ultrasound visualization, the distal right popliteal vein was identified with the patient's leg in frog-leg position which demonstrated mixed acute and subacute thrombus. Subdermal local anesthesia was administered at the planned needle entry site. A skin nick was made. Under direct ultrasound visualization, the distal popliteal vein was accessed  with a 21 gauge micropuncture needle. A 0.018 inch Nitrex wire was then directed into the femoral vein with minimal resistance. A micropuncture sheath was placed. Serial dilation was performed and an 8 [place], 11 cm vascular sheath was placed. A Glidewire was directed into the suprarenal inferior vena cava and exchanged over a 5 French catheter for a superstiff exchange length Amplatz wire. Right lower extremity venogram was then performed with hand injection. Venogram was significant for nearly occlusive thrombus throughout the visualized femoral vein and common femoral vein. There was sluggish flow through the external and common iliac veins into the inferior vena cava where there is peripheral flow around large residual thrombus burden within and inferior to the indwelling inferior vena cava. Intravascular ultrasound was performed which demonstrated similar findings to venogram: Echogenic, expansile thrombus throughout the visualized femoral vein into the common femoral vein, patent external iliac vein, wall adherent echogenic thrombus within the common iliac vein, patent inferior aspect of the inferior vena  cava and multifocal echogenic thrombus within the midportion of the infrarenal inferior vena cava extending into the indwelling inferior vena cava filter which is well positioned. The IVC is widely patent superior to the filter. Next, the internal jugular vein was assessed with ultrasound and was compressible and free of internal echoes. Subdermal Local anesthesia was administered 1% lidocaine at the planned needle entry site. A skin nick was made. Under direct ultrasound visualization, a 21 gauge micropuncture needle was advanced into the internal jugular vein and a micropuncture set was placed. Through this, a Wholey wire was directed to the inferior vena cava. Serial dilation was performed and a 16 French, 70 cm sheath was placed, directed under direct fluoroscopic guidance through the indwelling inferior vena cava filter. Then, from the thigh lower extremity access, an Amplatz wire was directed into the distal aspect of the sheath lumen and through and through flossing access was established. The indwelling 8 French sheath was then exchanged for a 16 [place] ClotTriever sheath after serial dilation under fluoroscopic guidance. The ClotTriever catheter was then advanced into the 16 French IJ sheath, the coring element was exposed, and mechanical thrombectomy was performed from the inferior vena cava through the femoral vein. A total of 6 passes with this technique were performed yielding large volume of acute and chronic appearing thrombus. Repeat venogram was performed through the indwelling popliteal sheath which demonstrated significantly improved patency inflow of the femoral vein and inferior vena cava. There is significant flow-limiting focal stenosis in the mid femoral vein and at the saphenofemoral junction. Intravascular ultrasound was then again performed which demonstrated significantly improved patency of the femoral vein with the exception of echogenic wall adherent thrombus in the mid femoral vein in  addition to at the saphenofemoral junction. There is persistent echogenic thrombus just inferior to the indwelling inferior vena cava filter, decreased from comparison. Balloon angioplasty was then performed in the mid femoral vein and at the saphenofemoral junction with a 12 mm by 4 cm atlas balloon. Repeat venogram demonstrated minimally improved patency at the 2 stenotic areas. Therefore, 2 additional passes with the ClotTriever device were performed from the common femoral vein through the femoral vein yielding small volume chronic appearing thrombus. Repeat intravascular ultrasound and venogram demonstrated persistent flow limiting focal stenosis at the saphenofemoral junction and persistent chronic appearing thrombus within the inferior vena cava, just inferior to the indwelling filter. Therefore, the 22 [place] FlowTriever device was used to perform suction thrombectomy within the inferior aspect of the filter as well  as in the femoral vein at multiple sites. Multiple acute on chronic thrombi were aspirated from the inferior vena cava. There is minimal yield in the femoral vein. Repeat intravascular ultrasound venogram demonstrated persistent flow limiting stenosis at the saphenofemoral junction and improved thrombus burden in the inferior vena cava. Therefore, repeat balloon angioplasty of saphenofemoral junction and in the mid femoral vein was performed with a 14 mm x 4 cm atlas balloon with 1 minute inflation times. Completion venography and intravascular ultrasound was then performed. There is significantly improved patency of the mid femoral vein focal stenosis. There is persistent chronic appearing thrombus but improved patency at the saphenofemoral junction. The distal aspect of the inferior vena cava is widely patent. There is persistent small volume chronic appearing thrombus just inferior to the indwelling inferior vena cava filter. The superior aspect of the inferior vena cava remains widely patent.  The catheters and sheath were then removed. A per string stitch was placed at the popliteal access site. Manual compression was applied at both sites for approximately 5 minutes and hemostasis was achieved. The patient tolerated the procedure well and was returned to the floor in stable condition. FINDINGS: Severe mixed acute and chronic deep vein thrombosis extending from the right calf to the indwelling inferior vena cava. Technically successful mechanical thrombectomy, suction thrombectomy, and balloon angioplasty. Small volume residual IVC and right femoral chronic appearing thrombus, most prominent at the saphenofemoral junction. IMPRESSION: 1. Severe mixed acute and chronic deep vein thrombosis extending from the right calf to the indwelling inferior vena cava. 2. Technically successful mechanical thrombectomy, suction thrombectomy, and balloon angioplasty from the right femoral vein to the inferior vena cava. 3. Small volume residual inferior vena cava and right femoral chronic appearing thrombus. PLAN: 1. Bilateral thigh high compression stockings (20-30 mmHg) at all times. Continue compression stocking use after discharge. 2. Bilateral SCDs at all times unless ambulating. 3. Continue anticoagulation. 4. Follow up in IR clinic in 2 months with CTV abdomen/pelvis. Marliss Cootsylan Suttle, MD [facility] Electronically Signed   By: Marliss Cootsylan  Suttle MD   On: 11/03/2020 08:38   [facility]  Result Date: 10/30/2020 CLINICAL DATA:  Recent hemorrhagic stroke. Lower extremity DVT right worse than left, with IVC filter deployment 10/08/2020. Follow-up CT demonstrated iliocaval thrombosis. EXAM: THROMBOECTOMY MECHANICAL VENOUS; INFERIOR VENA CAVOGRAM; IR ULTRASOUND GUIDANCE VASC ACCESS LEFT ANESTHESIA/SEDATION: Intravenous Fentanyl 100mcg and Versed 2mg  were administered as conscious sedation during continuous monitoring of the patient's level of  consciousness and physiological / cardiorespiratory status by the radiology RN, with a total moderate sedation time of 105 minutes. MEDICATIONS: Lidocaine 1% subcutaneous CONTRAST:  100mL OMNIPAQUE IOHEXOL 300 MG/ML SOLN, 30mL OMNIPAQUE IOHEXOL 300 MG/ML SOLN PROCEDURE: The procedure, risks (including but not limited to bleeding, infection, organ damage ), benefits, and alternatives were explained to the patient and spouse. Questions regarding the procedure were encouraged and answered. The spouse understands and consents to the procedure. Right IJ and bilateral femoral regions prepped and draped in usual sterile fashion. Maximal barrier sterile technique was utilized including caps, mask, sterile gowns, sterile gloves, sterile drape, hand hygiene and skin antiseptic. The left common iliac vein was localized under ultrasound, patency documented. Under real-time ultrasound guidance, the vessel was accessed with a 21-gauge micropuncture needle, exchanged over a 018 guidewire for a transitional dilator. Venography was performed. Patient was given IV heparin boluses with intermittent ACT checks targeting 300. The dilator was exchanged over an Amplatz wire for  a Gore 24 [place] dry seal sheath. Through this, the 71 French embolectomy sheath was advanced. After confirmatory venography, the coaxial angled AngioVac F22 cannula was advanced for multiple embolectomy passes through the infrarenal IVC up to the margin of the previously placed filter, with intermittent venography and repositioning. After a total of 535 mL procedure-related blood loss had occurred, and based on the residual caval thrombus volume, the procedure was terminated. The venous sheath was removed and hemostasis achieved at the site with manual compression and a 2-0 Ethilon figure-8 suture. Dr. Loreta Ave assisted intermittently with the procedure. The patient tolerated the procedure well. COMPLICATIONS: None immediate FINDINGS: Ultrasound demonstrated patency  of the left common femoral vein, without thrombus. Left iliac venography demonstrated no left iliac venous thrombus. There is nearly occlusive caval thrombosis from the iliac confluence through the IVC filter. After multiple image-directed passes of thrombectomy device, there is significant debulking of the infrarenal caval thrombus with restoration of antegrade flow through the IVC and filter. There is a moderately large residual caval thrombus volume, with some protruding above the tip of the filter. For this reason, filter retrieval was not entertained. IMPRESSION: 1. 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 2. Technically successful debulking of caval thrombus allowing restoration of antegrade flow through the infrarenal IVC and filter. 3. Embolectomy procedure terminated due to concomitant blood loss, such that a complete caval thrombectomy was not achieved, nor was right lower extremity thrombectomy attempted, and the IVC filter was left in place. Electronically Signed   By: Corlis Leak M.D.   On: 10/30/2020 09:19   IR US Guide Vasc Access Left  Result Date: 10/30/2020 CLINICAL DATA:  Recent hemorrhagic stroke. Lower extremity DVT right worse than left, with IVC filter deployment 10/08/2020. Follow-up CT demonstrated iliocaval thrombosis. EXAM: THROMBOECTOMY MECHANICAL VENOUS; INFERIOR VENA CAVOGRAM; IR ULTRASOUND GUIDANCE VASC ACCESS LEFT ANESTHESIA/SEDATION: Intravenous Fentanyl and Versed 2mg  were administered as conscious sedation during continuous monitoring of the patient's level of consciousness and physiological / cardiorespiratory status by the radiology RN, with a total moderate sedation time of 105 minutes. MEDICATIONS: Lidocaine 1% subcutaneous CONTRAST:  OMNIPAQUE IOHEXOL 300 MG/ML SOLN, 30mL OMNIPAQUE IOHEXOL 300 MG/ML SOLN PROCEDURE: The procedure, risks (including but not limited to bleeding, infection, organ damage ), benefits, and alternatives were explained  to the patient and spouse. Questions regarding the procedure were encouraged and answered. The spouse understands and consents to the procedure. Right IJ and bilateral femoral regions prepped and draped in usual sterile fashion. Maximal barrier sterile technique was utilized including caps, mask, sterile gowns, sterile gloves, sterile drape, hand hygiene and skin antiseptic. The left common iliac vein was localized under ultrasound, patency documented. Under real-time ultrasound guidance, the vessel was accessed with a 21-gauge micropuncture needle, exchanged over a 018 guidewire for a transitional dilator. Venography was performed. Patient was given IV heparin boluses with intermittent ACT checks targeting 300. The dilator was exchanged over an Amplatz wire for a [employer] French dry seal sheath. Through this, the 16 French embolectomy sheath was advanced. After confirmatory venography, the coaxial angled AngioVac F22 cannula was advanced for multiple embolectomy passes through the infrarenal IVC up to the margin of the previously placed filter, with intermittent venography and repositioning. After a total of 535 mL procedure-related blood loss had occurred, and based on the residual caval thrombus volume, the procedure was terminated. The venous sheath was removed and hemostasis achieved at the site with manual compression and a 2-0 Ethilon figure-8 suture. Dr. Loreta Ave  assisted intermittently with the procedure. The patient tolerated the procedure well. COMPLICATIONS: None immediate FINDINGS: Ultrasound demonstrated patency of the left common femoral vein, without thrombus. Left iliac venography demonstrated no left iliac venous thrombus. There is nearly occlusive caval thrombosis from the iliac confluence through the IVC filter. After multiple image-directed passes of thrombectomy device, there is significant debulking of the infrarenal caval thrombus with restoration of antegrade flow through the IVC and filter.  There is a moderately large residual caval thrombus volume, with some protruding above the tip of the filter. For this reason, filter retrieval was not entertained. IMPRESSION: 1. 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 2. Technically successful debulking of caval thrombus allowing restoration of antegrade flow through the infrarenal IVC and filter. 3. Embolectomy procedure terminated due to concomitant blood loss, such that a complete caval thrombectomy was not achieved, nor was right lower extremity thrombectomy attempted, and the IVC filter was left in place. Electronically Signed   By: Corlis Leak M.D.   On: 10/30/2020 09:19   IR US Guide Vasc Access Right  Result Date: 11/03/2020 INDICATION: 64 year old male with history of recent hemorrhagic stroke status post IVC filter placement on 10/08/2020 with subsequent propagation of right greater than left lower extremity deep vein thrombosis with ileocaval involvement and near occlusion of the inferior vena cava. Status post suction thrombectomy on 10/27/2020 with inadequate clearance of iliocaval thrombus. EXAM: 1. Ultrasound-guided venous access of the right popliteal vein. 2. Ultrasound-guided venous access of the right internal jugular vein. 3. Right lower extremity venogram. 4. Inferior vena cavagram. 5. Intravascular ultrasound. 6. Mechanical thrombectomy of the inferior vena cava, right internal and external iliac, common femoral, and femoral veins. 7. Suction thrombectomy of the inferior vena cava, right internal and external iliac, common femoral, and femoral veins. 8. Balloon angioplasty of the right femoral and common femoral veins. COMPARISON:  10/26/2020, 10/27/2020 MEDICATIONS: 13000 units heparin, intravenous ANESTHESIA/SEDATION: Versed 5 mg IV; Fentanyl 250 mcg IV Moderate Sedation Time:  2 hours, 45 minutes The patient was continuously monitored during the procedure by the interventional radiology nurse under my direct  supervision. FLUOROSCOPY TIME:  Fluoroscopy Time: 25 minutes 18 seconds (81 mGy). CONTRAST:  75 mL Omnipaque 300, intravenous COMPLICATIONS: None immediate. TECHNIQUE: Informed written consent was obtained from the patient after a thorough discussion of the procedural risks, benefits and alternatives. All questions were addressed. Maximal Sterile Barrier Technique was utilized including caps, mask, sterile gowns, sterile gloves, sterile drape, hand hygiene and skin antiseptic. A timeout was performed prior to the initiation of the procedure. The bilateral lower extremities and right neck were prepped and draped in standard fashion. Under direct ultrasound visualization, the distal right popliteal vein was identified with the patient's leg in frog-leg position which demonstrated mixed acute and subacute thrombus. Subdermal local anesthesia was administered at the planned needle entry site. A skin nick was made. Under direct ultrasound visualization, the distal popliteal vein was accessed with a 21 gauge micropuncture needle. A 0.018 inch Nitrex wire was then directed into the femoral vein with minimal resistance. A micropuncture sheath was placed. Serial dilation was performed and an 8 [place], 11 cm vascular sheath was placed. A Glidewire was directed into the suprarenal inferior vena cava and exchanged over a 5 French catheter for a superstiff exchange length Amplatz wire. Right lower extremity venogram was then performed with hand injection. Venogram was significant for nearly occlusive thrombus throughout the visualized femoral vein and common femoral vein. There was sluggish flow through the  external and common iliac veins into the inferior vena cava where there is peripheral flow around large residual thrombus burden within and inferior to the indwelling inferior vena cava. Intravascular ultrasound was performed which demonstrated similar findings to venogram: Echogenic, expansile thrombus throughout the  visualized femoral vein into the common femoral vein, patent external iliac vein, wall adherent echogenic thrombus within the common iliac vein, patent inferior aspect of the inferior vena cava and multifocal echogenic thrombus within the midportion of the infrarenal inferior vena cava extending into the indwelling inferior vena cava filter which is well positioned. The IVC is widely patent superior to the filter. Next, the internal jugular vein was assessed with ultrasound and was compressible and free of internal echoes. Subdermal Local anesthesia was administered 1% lidocaine at the planned needle entry site. A skin nick was made. Under direct ultrasound visualization, a 21 gauge micropuncture needle was advanced into the internal jugular vein and a micropuncture set was placed. Through this, a Wholey wire was directed to the inferior vena cava. Serial dilation was performed and a 16 French, 70 cm sheath was placed, directed under direct fluoroscopic guidance through the indwelling inferior vena cava filter. Then, from the thigh lower extremity access, an Amplatz wire was directed into the distal aspect of the sheath lumen and through and through flossing access was established. The indwelling 8 French sheath was then exchanged for a 16 [place] ClotTriever sheath after serial dilation under fluoroscopic guidance. The ClotTriever catheter was then advanced into the 16 French IJ sheath, the coring element was exposed, and mechanical thrombectomy was performed from the inferior vena cava through the femoral vein. A total of 6 passes with this technique were performed yielding large volume of acute and chronic appearing thrombus. Repeat venogram was performed through the indwelling popliteal sheath which demonstrated significantly improved patency inflow of the femoral vein and inferior vena cava. There is significant flow-limiting focal stenosis in the mid femoral vein and at the saphenofemoral junction.  Intravascular ultrasound was then again performed which demonstrated significantly improved patency of the femoral vein with the exception of echogenic wall adherent thrombus in the mid femoral vein in addition to at the saphenofemoral junction. There is persistent echogenic thrombus just inferior to the indwelling inferior vena cava filter, decreased from comparison. Balloon angioplasty was then performed in the mid femoral vein and at the saphenofemoral junction with a 12 mm by 4 cm atlas balloon. Repeat venogram demonstrated minimally improved patency at the 2 stenotic areas. Therefore, 2 additional passes with the ClotTriever device were performed from the common femoral vein through the femoral vein yielding small volume chronic appearing thrombus. Repeat intravascular ultrasound and venogram demonstrated persistent flow limiting focal stenosis at the saphenofemoral junction and persistent chronic appearing thrombus within the inferior vena cava, just inferior to the indwelling filter. Therefore, the 62 [place] FlowTriever device was used to perform suction thrombectomy within the inferior aspect of the filter as well as in the femoral vein at multiple sites. Multiple acute on chronic thrombi were aspirated from the inferior vena cava. There is minimal yield in the femoral vein. Repeat intravascular ultrasound venogram demonstrated persistent flow limiting stenosis at the saphenofemoral junction and improved thrombus burden in the inferior vena cava. Therefore, repeat balloon angioplasty of saphenofemoral junction and in the mid femoral vein was performed with a 14 mm x 4 cm atlas balloon with 1 minute inflation times. Completion venography and intravascular ultrasound was then performed. There is significantly improved patency of the mid femoral  vein focal stenosis. There is persistent chronic appearing thrombus but improved patency at the saphenofemoral junction. The distal aspect of the inferior vena cava is  widely patent. There is persistent small volume chronic appearing thrombus just inferior to the indwelling inferior vena cava filter. The superior aspect of the inferior vena cava remains widely patent. The catheters and sheath were then removed. A per string stitch was placed at the popliteal access site. Manual compression was applied at both sites for approximately 5 minutes and hemostasis was achieved. The patient tolerated the procedure well and was returned to the floor in stable condition. FINDINGS: Severe mixed acute and chronic deep vein thrombosis extending from the right calf to the indwelling inferior vena cava. Technically successful mechanical thrombectomy, suction thrombectomy, and balloon angioplasty. Small volume residual IVC and right femoral chronic appearing thrombus, most prominent at the saphenofemoral junction. IMPRESSION: 1. Severe mixed acute and chronic deep vein thrombosis extending from the right calf to the indwelling inferior vena cava. 2. Technically successful mechanical thrombectomy, suction thrombectomy, and balloon angioplasty from the right femoral vein to the inferior vena cava. 3. Small volume residual inferior vena cava and right femoral chronic appearing thrombus. PLAN: 1. Bilateral thigh high compression stockings (20-30 mmHg) at all times. Continue compression stocking use after discharge. 2. Bilateral SCDs at all times unless ambulating. 3. Continue anticoagulation. 4. Follow up in IR clinic in 2 months with CTV abdomen/pelvis. Marliss Coots, MD [facility] Electronically Signed   By: Marliss Coots MD   On: 11/03/2020 08:38   VAS [place] IVC/ILIAC (VENOUS ONLY)  Result Date: 10/25/2020 IVC/ILIAC STUDY Patient Name:  Walter [name]  Date of Exam:   10/25/2020 Medical Rec #: [id number]        Accession #:    [id number] Date of Birth: 1955/10/15        Patient Gender: M Patient Age:   65Y Exam Location:  [facility] Procedure:      VAS [place] IVC/ILIAC (VENOUS ONLY) Referring Phys: [id number] [facility] MIKHAIL --------------------------------------------------------------------------------  Indications: Extensive right lower extremity DVT Limitations: Air/bowel gas, patient unable to cooperate and obesity.  Comparison Study: No prior study Performing Technologist: Gertie Fey MHA, RDMS, RVT, RDCS  Examination Guidelines: A complete evaluation includes B-mode imaging, spectral Doppler, color Doppler, and power Doppler as needed of all accessible portions of each vessel. Bilateral testing is considered an integral part of a complete examination. Limited examinations for reoccurring indications may be performed as noted.  IVC/Iliac Findings: +----------+------+--------+--------+    IVC    PatentThrombusComments +----------+------+--------+--------+ IVC Prox         acute           +----------+------+--------+--------+ IVC Mid          acute           +----------+------+--------+--------+ IVC Distal       acute           +----------+------+--------+--------+  +-------------------+---------+-----------+---------+-----------+--------+         CIV        RT-PatentRT-ThrombusLT-PatentLT-ThrombusComments +-------------------+---------+-----------+---------+-----------+--------+ Common Iliac Distal            acute    patent                      +-------------------+---------+-----------+---------+-----------+--------+  +-------------------------+---------+-----------+---------+-----------+--------+            EIV           RT-PatentRT-ThrombusLT-PatentLT-ThrombusComments +-------------------------+---------+-----------+---------+-----------+--------+ External Iliac Vein  acute    patent                      Distal                                                                    +-------------------------+---------+-----------+---------+-----------+--------+    Summary: IVC/Iliac: There is evidence of acute thrombus involving the IVC. There is no evidence of thrombus involving the left common iliac vein. There is evidence of acute thrombus involving the right common iliac vein. There is no evidence of thrombus involving  the left external iliac vein. There is evidence of acute thrombus involving the right external iliac vein.  *See table(s) above for measurements and observations.  Electronically signed by Coral Else MD on 10/25/2020 at 6:16:09 PM.    Final    IR PTA VENOUS EXCEPT DIALYSIS CIRCUIT  Result Date: 11/03/2020 INDICATION: 65 year old male with history of recent hemorrhagic stroke status post IVC filter placement on 10/08/2020 with subsequent propagation of right greater than left lower extremity deep vein thrombosis with ileocaval involvement and near occlusion of the inferior vena cava. Status post suction thrombectomy on 10/27/2020 with inadequate clearance of iliocaval thrombus. EXAM: 1. Ultrasound-guided venous access of the right popliteal vein. 2. Ultrasound-guided venous access of the right internal jugular vein. 3. Right lower extremity venogram. 4. Inferior vena cavagram. 5. Intravascular ultrasound. 6. Mechanical thrombectomy of the inferior vena cava, right internal and external iliac, common femoral, and femoral veins. 7. Suction thrombectomy of the inferior vena cava, right internal and external iliac, common femoral, and femoral veins. 8. Balloon angioplasty of the right femoral and common femoral veins. COMPARISON:  10/26/2020, 10/27/2020 MEDICATIONS: 13000 units heparin, intravenous ANESTHESIA/SEDATION: Versed 5 mg IV; Fentanyl 250 mcg IV Moderate Sedation Time:  2 hours, 45 minutes The patient was continuously monitored during the procedure by the interventional radiology nurse under my direct supervision. FLUOROSCOPY TIME:  Fluoroscopy Time: 25 minutes 18 seconds (81 mGy). CONTRAST:  75 mL Omnipaque 300, intravenous COMPLICATIONS: None  immediate. TECHNIQUE: Informed written consent was obtained from the patient after a thorough discussion of the procedural risks, benefits and alternatives. All questions were addressed. Maximal Sterile Barrier Technique was utilized including caps, mask, sterile gowns, sterile gloves, sterile drape, hand hygiene and skin antiseptic. A timeout was performed prior to the initiation of the procedure. The bilateral lower extremities and right neck were prepped and draped in standard fashion. Under direct ultrasound visualization, the distal right popliteal vein was identified with the patient's leg in frog-leg position which demonstrated mixed acute and subacute thrombus. Subdermal local anesthesia was administered at the planned needle entry site. A skin nick was made. Under direct ultrasound visualization, the distal popliteal vein was accessed with a 21 gauge micropuncture needle. A 0.018 inch Nitrex wire was then directed into the femoral vein with minimal resistance. A micropuncture sheath was placed. Serial dilation was performed and an 8 [place], 11 cm vascular sheath was placed. A Glidewire was directed into the suprarenal inferior vena cava and exchanged over a 5 French catheter for a superstiff exchange length Amplatz wire. Right lower extremity venogram was then performed with hand injection. Venogram was significant for nearly occlusive thrombus throughout the visualized femoral vein and common femoral  vein. There was sluggish flow through the external and common iliac veins into the inferior vena cava where there is peripheral flow around large residual thrombus burden within and inferior to the indwelling inferior vena cava. Intravascular ultrasound was performed which demonstrated similar findings to venogram: Echogenic, expansile thrombus throughout the visualized femoral vein into the common femoral vein, patent external iliac vein, wall adherent echogenic thrombus within the common iliac vein, patent  inferior aspect of the inferior vena cava and multifocal echogenic thrombus within the midportion of the infrarenal inferior vena cava extending into the indwelling inferior vena cava filter which is well positioned. The IVC is widely patent superior to the filter. Next, the internal jugular vein was assessed with ultrasound and was compressible and free of internal echoes. Subdermal Local anesthesia was administered 1% lidocaine at the planned needle entry site. A skin nick was made. Under direct ultrasound visualization, a 21 gauge micropuncture needle was advanced into the internal jugular vein and a micropuncture set was placed. Through this, a Wholey wire was directed to the inferior vena cava. Serial dilation was performed and a 16 French, 70 cm sheath was placed, directed under direct fluoroscopic guidance through the indwelling inferior vena cava filter. Then, from the thigh lower extremity access, an Amplatz wire was directed into the distal aspect of the sheath lumen and through and through flossing access was established. The indwelling 8 French sheath was then exchanged for a 16 [place] ClotTriever sheath after serial dilation under fluoroscopic guidance. The ClotTriever catheter was then advanced into the 16 French IJ sheath, the coring element was exposed, and mechanical thrombectomy was performed from the inferior vena cava through the femoral vein. A total of 6 passes with this technique were performed yielding large volume of acute and chronic appearing thrombus. Repeat venogram was performed through the indwelling popliteal sheath which demonstrated significantly improved patency inflow of the femoral vein and inferior vena cava. There is significant flow-limiting focal stenosis in the mid femoral vein and at the saphenofemoral junction. Intravascular ultrasound was then again performed which demonstrated significantly improved patency of the femoral vein with the exception of echogenic wall  adherent thrombus in the mid femoral vein in addition to at the saphenofemoral junction. There is persistent echogenic thrombus just inferior to the indwelling inferior vena cava filter, decreased from comparison. Balloon angioplasty was then performed in the mid femoral vein and at the saphenofemoral junction with a 12 mm by 4 cm atlas balloon. Repeat venogram demonstrated minimally improved patency at the 2 stenotic areas. Therefore, 2 additional passes with the ClotTriever device were performed from the common femoral vein through the femoral vein yielding small volume chronic appearing thrombus. Repeat intravascular ultrasound and venogram demonstrated persistent flow limiting focal stenosis at the saphenofemoral junction and persistent chronic appearing thrombus within the inferior vena cava, just inferior to the indwelling filter. Therefore, the 38 [place] FlowTriever device was used to perform suction thrombectomy within the inferior aspect of the filter as well as in the femoral vein at multiple sites. Multiple acute on chronic thrombi were aspirated from the inferior vena cava. There is minimal yield in the femoral vein. Repeat intravascular ultrasound venogram demonstrated persistent flow limiting stenosis at the saphenofemoral junction and improved thrombus burden in the inferior vena cava. Therefore, repeat balloon angioplasty of saphenofemoral junction and in the mid femoral vein was performed with a 14 mm x 4 cm atlas balloon with 1 minute inflation times. Completion venography and intravascular ultrasound was then performed. There is  significantly improved patency of the mid femoral vein focal stenosis. There is persistent chronic appearing thrombus but improved patency at the saphenofemoral junction. The distal aspect of the inferior vena cava is widely patent. There is persistent small volume chronic appearing thrombus just inferior to the indwelling inferior vena cava filter. The superior aspect of  the inferior vena cava remains widely patent. The catheters and sheath were then removed. A per string stitch was placed at the popliteal access site. Manual compression was applied at both sites for approximately 5 minutes and hemostasis was achieved. The patient tolerated the procedure well and was returned to the floor in stable condition. FINDINGS: Severe mixed acute and chronic deep vein thrombosis extending from the right calf to the indwelling inferior vena cava. Technically successful mechanical thrombectomy, suction thrombectomy, and balloon angioplasty. Small volume residual IVC and right femoral chronic appearing thrombus, most prominent at the saphenofemoral junction. IMPRESSION: 1. Severe mixed acute and chronic deep vein thrombosis extending from the right calf to the indwelling inferior vena cava. 2. Technically successful mechanical thrombectomy, suction thrombectomy, and balloon angioplasty from the right femoral vein to the inferior vena cava. 3. Small volume residual inferior vena cava and right femoral chronic appearing thrombus. PLAN: 1. Bilateral thigh high compression stockings (20-30 mmHg) at all times. Continue compression stocking use after discharge. 2. Bilateral SCDs at all times unless ambulating. 3. Continue anticoagulation. 4. Follow up in IR clinic in 2 months with CTV abdomen/pelvis. Marliss Coots, MD [facility] Radiology Electronically Signed   By: Marliss Coots MD   On: 11/03/2020 08:38   CT VENOGRAM ABD/PEL  Result Date: 10/27/2020 CLINICAL DATA:  65 year old with bilateral lower extremity DVT. Evaluate for IVC involvement. EXAM: CT VENOGRAM ABDOMEN AND PELVIS TECHNIQUE: Multidetector CT imaging of the abdomen and pelvis was performed using the standard protocol during bolus administration of intravenous contrast. Multiplanar reconstructed images and MIPs were obtained and reviewed to evaluate the vascular anatomy. CONTRAST:   OMNIPAQUE IOHEXOL 350 MG/ML SOLN COMPARISON:  10/17/2020 FINDINGS: VASCULAR Arterial: Normal caliber of the abdominal aorta with atherosclerotic disease. No evidence for aortic aneurysm. Although this is not a CTA examination, there is flow in the celiac trunk, SMA, bilateral renal arteries and IMA. Bilateral iliac arteries and proximal femoral arteries are patent bilaterally. Venous: IVC filter positioned just below the renal veins. The infrarenal IVC is expanded with a large amount of thrombus. Greater than 70% of the infrarenal IVC contains thrombus. There is thrombus within the filter that probably extends up to the filter retrieval hook. Bilateral renal veins are patent. Suprarenal IVC is patent. Left common and external iliac veins are patent without definite thrombus. There appears to be some nonocclusive thrombus involving the distal left common femoral vein. There is clot at the IVC bifurcation. No significant clot in the right common iliac vein but there is expansile nonocclusive thrombus in the right external iliac vein. There is thrombus involving the proximal right femoral veins including the profunda femoral vein and the proximal femoral veins. 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 Main portal venous system is patent. Review of the MIP images confirms the above findings. NON-VASCULAR Lower chest: Small right pleural effusion is new. Probable trace left pleural fluid. Compressive atelectasis at both lung bases. Hepatobiliary: Normal appearance of the and gallbladder. No biliary dilatation. Pancreas: Unremarkable. No pancreatic ductal dilatation or surrounding inflammatory changes. Spleen: Normal in size without focal abnormality. Adrenals/Urinary Tract: Normal adrenal glands.  Bilateral parapelvic renal cysts. No hydronephrosis. Moderate amount of fluid in the urinary bladder. Stomach/Bowel: Orogastric feeding tube that terminates near the duodenal bulb. No evidence  for bowel dilatation or focal inflammation. Multiple diverticula involving the sigmoid colon. Appendix has a normal appearance and contains oral contrast. Lymphatic: No abdominal or pelvic lymphadenopathy. Reproductive: No acute abnormality to the prostate. Other: Increased presacral edema. Subcutaneous edema. No significant free fluid in the abdomen or pelvis. Negative for free air. Small umbilical hernia containing fat. Musculoskeletal: Disc space narrowing at L5-S1. No acute bone abnormality. IMPRESSION: VASCULAR 1. Positive for thrombus involving the IVC and iliac veins. Large amount of thrombus involving the infrarenal IVC and IVC filter. Suprarenal IVC is patent. Thrombus in the right iliac veins and thrombus in the proximal femoral veins bilaterally. 2.  Aortic Atherosclerosis (ICD10-I70.0). NON-VASCULAR 1. New small right pleural effusion. Compressive atelectasis at the lung bases. 2. Subcutaneous edema with increased presacral edema. 3. Bilateral parapelvic renal cysts without hydronephrosis. 4. Colonic diverticulosis without acute colonic inflammation. Electronically Signed   By: Richarda Overlie M.D.   On: 10/27/2020 09:55   IR IVUS EACH ADDITIONAL NON CORONARY VESSEL  Result Date: 11/03/2020 INDICATION: 65 year old male with history of recent hemorrhagic stroke status post IVC filter placement on 10/08/2020 with subsequent propagation of right greater than left lower extremity deep vein thrombosis with ileocaval involvement and near occlusion of the inferior vena cava. Status post suction thrombectomy on 10/27/2020 with inadequate clearance of iliocaval thrombus. EXAM: 1. Ultrasound-guided venous access of the right popliteal vein. 2. Ultrasound-guided venous access of the right internal jugular vein. 3. Right lower extremity venogram. 4. Inferior vena cavagram. 5. Intravascular ultrasound. 6. Mechanical thrombectomy of the inferior vena cava, right internal and external iliac, common femoral, and femoral  veins. 7. Suction thrombectomy of the inferior vena cava, right internal and external iliac, common femoral, and femoral veins. 8. Balloon angioplasty of the right femoral and common femoral veins. COMPARISON:  10/26/2020, 10/27/2020 MEDICATIONS: 13000 units heparin, intravenous ANESTHESIA/SEDATION: Versed 5 mg IV; Fentanyl 250 mcg IV Moderate Sedation Time:  2 hours, 45 minutes The patient was continuously monitored during the procedure by the interventional radiology nurse under my direct supervision. FLUOROSCOPY TIME:  Fluoroscopy Time: 25 minutes 18 seconds (81 mGy). CONTRAST:  75 mL Omnipaque 300, intravenous COMPLICATIONS: None immediate. TECHNIQUE: Informed written consent was obtained from the patient after a thorough discussion of the procedural risks, benefits and alternatives. All questions were addressed. Maximal Sterile Barrier Technique was utilized including caps, mask, sterile gowns, sterile gloves, sterile drape, hand hygiene and skin antiseptic. A timeout was performed prior to the initiation of the procedure. The bilateral lower extremities and right neck were prepped and draped in standard fashion. Under direct ultrasound visualization, the distal right popliteal vein was identified with the patient's leg in frog-leg position which demonstrated mixed acute and subacute thrombus. Subdermal local anesthesia was administered at the planned needle entry site. A skin nick was made. Under direct ultrasound visualization, the distal popliteal vein was accessed with a 21 gauge micropuncture needle. A 0.018 inch Nitrex wire was then directed into the femoral vein with minimal resistance. A micropuncture sheath was placed. Serial dilation was performed and an 8 [place], 11 cm vascular sheath was placed. A Glidewire was directed into the suprarenal inferior vena cava and exchanged over a 5 French catheter for a superstiff exchange length Amplatz wire. Right lower extremity venogram was then performed with  hand injection. Venogram was significant for nearly occlusive thrombus  throughout the visualized femoral vein and common femoral vein. There was sluggish flow through the external and common iliac veins into the inferior vena cava where there is peripheral flow around large residual thrombus burden within and inferior to the indwelling inferior vena cava. Intravascular ultrasound was performed which demonstrated similar findings to venogram: Echogenic, expansile thrombus throughout the visualized femoral vein into the common femoral vein, patent external iliac vein, wall adherent echogenic thrombus within the common iliac vein, patent inferior aspect of the inferior vena cava and multifocal echogenic thrombus within the midportion of the infrarenal inferior vena cava extending into the indwelling inferior vena cava filter which is well positioned. The IVC is widely patent superior to the filter. Next, the internal jugular vein was assessed with ultrasound and was compressible and free of internal echoes. Subdermal Local anesthesia was administered 1% lidocaine at the planned needle entry site. A skin nick was made. Under direct ultrasound visualization, a 21 gauge micropuncture needle was advanced into the internal jugular vein and a micropuncture set was placed. Through this, a Wholey wire was directed to the inferior vena cava. Serial dilation was performed and a 16 French, 70 cm sheath was placed, directed under direct fluoroscopic guidance through the indwelling inferior vena cava filter. Then, from the thigh lower extremity access, an Amplatz wire was directed into the distal aspect of the sheath lumen and through and through flossing access was established. The indwelling 8 French sheath was then exchanged for a 16 [place] ClotTriever sheath after serial dilation under fluoroscopic guidance. The ClotTriever catheter was then advanced into the 16 French IJ sheath, the coring element was exposed, and mechanical  thrombectomy was performed from the inferior vena cava through the femoral vein. A total of 6 passes with this technique were performed yielding large volume of acute and chronic appearing thrombus. Repeat venogram was performed through the indwelling popliteal sheath which demonstrated significantly improved patency inflow of the femoral vein and inferior vena cava. There is significant flow-limiting focal stenosis in the mid femoral vein and at the saphenofemoral junction. Intravascular ultrasound was then again performed which demonstrated significantly improved patency of the femoral vein with the exception of echogenic wall adherent thrombus in the mid femoral vein in addition to at the saphenofemoral junction. There is persistent echogenic thrombus just inferior to the indwelling inferior vena cava filter, decreased from comparison. Balloon angioplasty was then performed in the mid femoral vein and at the saphenofemoral junction with a 12 mm by 4 cm atlas balloon. Repeat venogram demonstrated minimally improved patency at the 2 stenotic areas. Therefore, 2 additional passes with the ClotTriever device were performed from the common femoral vein through the femoral vein yielding small volume chronic appearing thrombus. Repeat intravascular ultrasound and venogram demonstrated persistent flow limiting focal stenosis at the saphenofemoral junction and persistent chronic appearing thrombus within the inferior vena cava, just inferior to the indwelling filter. Therefore, the 28 [place] FlowTriever device was used to perform suction thrombectomy within the inferior aspect of the filter as well as in the femoral vein at multiple sites. Multiple acute on chronic thrombi were aspirated from the inferior vena cava. There is minimal yield in the femoral vein. Repeat intravascular ultrasound venogram demonstrated persistent flow limiting stenosis at the saphenofemoral junction and improved thrombus burden in the inferior  vena cava. Therefore, repeat balloon angioplasty of saphenofemoral junction and in the mid femoral vein was performed with a 14 mm x 4 cm atlas balloon with 1 minute inflation times. Completion venography  and intravascular ultrasound was then performed. There is significantly improved patency of the mid femoral vein focal stenosis. There is persistent chronic appearing thrombus but improved patency at the saphenofemoral junction. The distal aspect of the inferior vena cava is widely patent. There is persistent small volume chronic appearing thrombus just inferior to the indwelling inferior vena cava filter. The superior aspect of the inferior vena cava remains widely patent. The catheters and sheath were then removed. A per string stitch was placed at the popliteal access site. Manual compression was applied at both sites for approximately 5 minutes and hemostasis was achieved. The patient tolerated the procedure well and was returned to the floor in stable condition. FINDINGS: Severe mixed acute and chronic deep vein thrombosis extending from the right calf to the indwelling inferior vena cava. Technically successful mechanical thrombectomy, suction thrombectomy, and balloon angioplasty. Small volume residual IVC and right femoral chronic appearing thrombus, most prominent at the saphenofemoral junction. IMPRESSION: 1. Severe mixed acute and chronic deep vein thrombosis extending from the right calf to the indwelling inferior vena cava. 2. Technically successful mechanical thrombectomy, suction thrombectomy, and balloon angioplasty from the right femoral vein to the inferior vena cava. 3. Small volume residual inferior vena cava and right femoral chronic appearing thrombus. PLAN: 1. Bilateral thigh high compression stockings (20-30 mmHg) at all times. Continue compression stocking use after discharge. 2. Bilateral SCDs at all times unless ambulating. 3. Continue anticoagulation. 4. Follow up in IR clinic in 2  months with CTV abdomen/pelvis. Marliss Coots, MD [facility] Radiology Electronically Signed   By: Marliss Coots MD   On: 11/03/2020 08:38   VAS [place] LOWER EXTREMITY VENOUS (DVT)  Result Date: 10/25/2020  Lower Venous DVT Study Patient Name:  Walter [name]  Date of Exam:   10/25/2020 Medical Rec #: [id number]        Accession #:    [id number] Date of Birth: 05-May-1956        Patient Gender: M Patient Age:   73Y Exam Location:  [facility] Procedure:      VAS [place] LOWER EXTREMITY VENOUS (DVT) Referring Phys: [id number] [facility] MIKHAIL --------------------------------------------------------------------------------  Indications: Recent right lower extremity DVT with worsening right lower extremity edema.  Limitations: Patient unable to cooperate. Comparison Study: 10/08/2020 lower extremity venous duplex- extensive right lower                   extremity acute DVT Performing Technologist: Gertie Fey MHA, RDMS, RVT, RDCS  Examination Guidelines: A complete evaluation includes B-mode imaging, spectral Doppler, color Doppler, and power Doppler as needed of all accessible portions of each vessel. Bilateral testing is considered an integral part of a complete examination. Limited examinations for reoccurring indications may be performed as noted. The reflux portion of the exam is performed with the patient in reverse Trendelenburg.  +---------+---------------+---------+-----------+----------+--------------+ RIGHT    CompressibilityPhasicitySpontaneityPropertiesThrombus Aging +---------+---------------+---------+-----------+----------+--------------+ CFV      None                    No                   Acute          +---------+---------------+---------+-----------+----------+--------------+ SFJ      None  Acute          +---------+---------------+---------+-----------+----------+--------------+ FV Prox   None                                         Acute          +---------+---------------+---------+-----------+----------+--------------+ FV Mid   None                                         Acute          +---------+---------------+---------+-----------+----------+--------------+ FV DistalNone                                         Acute          +---------+---------------+---------+-----------+----------+--------------+ PFV      None                                         Acute          +---------+---------------+---------+-----------+----------+--------------+ POP      None                    No                   Acute          +---------+---------------+---------+-----------+----------+--------------+ PTV      None                                         Acute          +---------+---------------+---------+-----------+----------+--------------+ PERO     None                                         Acute          +---------+---------------+---------+-----------+----------+--------------+ Soleal   None                                         Acute          +---------+---------------+---------+-----------+----------+--------------+ Gastroc  None                                         Acute          +---------+---------------+---------+-----------+----------+--------------+   +----+---------------+---------+-----------+----------+-----------------+ LEFTCompressibilityPhasicitySpontaneityPropertiesThrombus Aging    +----+---------------+---------+-----------+----------+-----------------+ CFV Partial                 Yes                  Age Indeterminate +----+---------------+---------+-----------+----------+-----------------+     Summary: RIGHT: - Findings consistent with acute deep vein thrombosis involving the right common femoral vein, SF junction, right femoral vein, right proximal profunda vein, right popliteal vein, right posterior  tibial veins, right  peroneal veins, right soleal veins, and right gastrocnemius veins. - No cystic structure found in the popliteal fossa.  LEFT: - Findings consistent with age indeterminate deep vein thrombosis involving the left common femoral vein.  *See table(s) above for measurements and observations. Electronically signed by Coral Else MD on 10/25/2020 at 6:15:59 PM.    Final     Labs:  Basic Metabolic Panel: Recent Labs  Lab 11/20/20 0621  NA 136  K 3.8  CL 102  CO2 26  GLUCOSE 116*  BUN 6*  CREATININE 0.80  CALCIUM 9.2    CBC: Recent Labs  Lab 11/20/20 0621  WBC 4.4  NEUTROABS 2.6  HGB 12.0*  HCT 37.2*  MCV 100.0  PLT 290    CBG: No results for input(s): GLUCAP in the last 168 hours.  Family history.  Mother with CAD.  Father with CAD.  Denies any colon cancer esophageal cancer or rectal cancer  Brief HPI:   Walter [name] is a 65 y.o. right-handed male with history of atrial fibrillation with ablation not on anticoagulation.  Lives with spouse independent prior to admission working as a Veterinary surgeon.  Presented 09/17/2020 with right side weakness headache and dysarthria as well as vomiting.  Cranial CT/MRI showed acute intraparenchymal hemorrhage in the dorsal left pons with subarachnoid extension into the cerebral aqueduct and fourth ventricle.  CT angiogram head and neck no vascular malformation anomaly or aneurysm.  Echocardiogram with ejection fraction of 55 to 60% grade 1 diastolic dysfunction no regional wall motion abnormalities.  Admission chemistries unremarkable except glucose 169 alcohol negative urine drug screen negative.  Patient did require emergent intubation for airway protection.  Hospital course complicated by hydrocephalus with diagnostic cerebral angiogram completed showing no evidence of aneurysm.  EVD was placed per neurosurgery.  Maintained on 3% hypertonic saline.  Patient extubated 09/28/2020.  Follow-up cardiology services for atrial fibrillation  maintained on Cardizem.  Patient was cleared to begin Lovenox for DVT prophylaxis 09/28/2020.  Repeat head CT scan 09/29/2020 showed new intraventricular hemorrhage into the third and lateral ventricles.  Fourth ventricular and midbrain hemorrhage unchanged and again repeated 10/24/2020 showing improved hemorrhage and edema along the shunt tract in the right frontal lobe.  No new area of hemorrhage.  Patient with long-term ventilatory support underwent tracheostomy 10/04/2020 per Dr. Merrily Pew critical care services.  On 10/08/2020 patient found to have bilateral lower extremity DVTs underwent placement of IVC filter per interventional radiology.  Dopplers again completed 10/25/2020 showing acute DVT right common femoral vein and SF junction and femoral vein proximal right profunda vein right popliteal vein and right posterior tibial vein right peroneal vein right soleal vein and right gastrocnemius vein it was established the patient had an IVC filter maintained on Eliquis which was also initiated.  Patient with fever of unknown origin infectious disease consulted broad-spectrum antibiotics initiated.  SARS coronavirus negative.  CT chest abdomen pelvis did show bilateral large volume right greater than left pulmonary emboli no evidence of right heart strain and was discussed to continue IVC filter as well as Eliquis.  Interventional radiology consulted to discuss possible removal of IVC filter with CT venogram 10/27/2020 showing thrombus involving the IVC and iliac veins.  Large amount of thrombus involving the infrarenal IVC and IVC filter.  Suprarenal IVC was patent.  Thrombus in the right iliac veins and thrombus in the proximal femoral veins bilaterally and plan was to undergo thrombectomy and retrieval of IVC filter 10/27/2020 per Dr. Deanne Coffer of interventional radiology.  Patient  initially n.p.o. with alternative means of nutritional support diet slowly advance.  Therapy evaluations completed due to patient decreased  functional mobility was admitted for a comprehensive rehab program.   Hospital Course: Walter [name] was admitted to rehab 10/28/2020 for inpatient therapies to consist of PT, ST and OT at least three hours five days a week. Past admission physiatrist, therapy team and rehab RN have worked together to provide customized collaborative inpatient rehab.  Pertaining to patient's intracerebral hemorrhage complicated by hydrocephalus right side weakness slurred speech.  Status post EVD placed per neurosurgery.  Patient was attending full therapies.  Hospital course complicated by acute pulmonary emboli bilateral DVT IVC filter placed 10/08/2020 with thrombectomy and retrieval of IVC filter 10/28/2020 per interventional radiology still has a suprarenal filter.  Patient had also been cleared to continue Eliquis.  Pain managed with use of oxycodone.  History of PAF status post ablation cardioversion Eliquis as advised cardio exam 30 mg 3 times daily cardiac rate controlled.  Acute hypoxic respiratory failure tracheostomy 10/04/2020 decannulated 11/09/2020 monitoring of oxygen saturations.  Post stroke dysphagia diet slowly advanced to mechanical soft.   Blood pressures were monitored on TID basis and soft and monitored     Rehab course: During patient's stay in rehab weekly team conferences were held to monitor patient's progress, set goals and discuss barriers to discharge. At admission, patient required moderate assist supine to sit moderate assist General transfers.  +2 physical assist sit to stand  Physical exam.  Blood pressure 120/70 pulse 80 temperature 98 respirations 18 oxygen saturations 92% Constitutional.  No acute distress HEENT Head.  Normocephalic and atraumatic Neck.  Supple nontender no JVD Cardiac regular rate and rhythm Abdomen.  Soft nontender positive bowel sounds without rebound Respiratory effort normal no respiratory distress without wheeze Skin intact Neurologic.  Alert somewhat  restless makes eye contact with examiner follows commands.  4/5 strength throughout  He/She  has had improvement in activity tolerance, balance, postural control as well as ability to compensate for deficits. He/She has had improvement in functional use RUE/LUE  and RLE/LLE as well as improvement in awareness.  Supervision for wheelchair mobility with use of bilateral lower extremities.  Minimal assist for wheelchair mobility use bilateral upper extremities.  Sit to stand minimal assist rolling walker.  Up-and-down stairs with minimal assistance.  Ambulates with the assistance of a rolling walker.  He did require some cues for safety.  Completed activities while standing rolling walker contact-guard assist.  Gather his belongings for activities day living and homemaking.  Speech therapy follow-up in regards to patient's dysphagia and current diet with restrictions.  Full family teaching completed and discharged to home       Disposition: Discharged to home    Diet: Regular  Special Instructions: No driving smoking or alcohol  Medications at discharge 1.  Tylenol as needed 2.  Eliquis 5 mg p.o. twice daily 3.  Klonopin 0.125 mg nightly as needed 4.  Cardizem 30 mg every 8 hours 5.  Cardura 2 mg p.o. daily 6.  Trazodone 25 mg p.o. nightly  30-35 minutes were spent completing discharge summary and discharge planning  Discharge Instructions    Ambulatory referral to Physical Medicine Rehab   Complete by: As directed    Moderate complexity follow-up 1 to 2 weeks [facility]       Follow-up Information    Kirsteins, Victorino Sparrow, MD Follow up.   Specialty: Physical Medicine and Rehabilitation Why: Office to call for appointment Contact information:  [address] [place] [address] [phone]        Oley Balm, MD Follow up.   Specialties: Interventional Radiology, Radiology Why: Call for appointment Contact information: [address] [place]  [address] [phone]        Barnett Abu, MD Follow up.   Specialty: Neurosurgery Why: Call for appointment Contact information: [address] [place] [address] [phone]        Hillis Range, MD Follow up.   Specialty: Cardiology Why: Call for appointment Contact information: [address] [place] [address] [phone]        Bennie Dallas, MD Follow up.   Specialties: Interventional Radiology, Diagnostic Radiology, Radiology Why: Follow up CT scan in 2 month. Our office will call you to set up the appointment. Please call our office for any questions about the appointment.  Contact information: [address] [place] [address] [phone]               Signed: Mcarthur Rossetti Armaan Pond 11/23/2020, 5:17 AM

## 2020-11-21 NOTE — Progress Notes (Signed)
Physical Therapy Session Note  Patient Details  Name: Walter [name] MRN: [id number] Date of Birth: [birth year]  Today's Date: 11/21/2020 PT Individual Time: 1000-1054 PT Individual Time Calculation (min): 54 min   Short Term Goals: Week 2:  PT Short Term Goal 1 (Week 2): Pt will consistently perform supine<>sit with supervision. PT Short Term Goal 1 - Progress (Week 2): Met PT Short Term Goal 2 (Week 2): Pt will perform sit<>stands using LRAD with CGA. PT Short Term Goal 2 - Progress (Week 2): Met PT Short Term Goal 3 (Week 2): Pt will perform SPVT transfers using LRAD with Min A. PT Short Term Goal 3 - Progress (Week 2): Progressing toward goal PT Short Term Goal 4 (Week 2): Pt will ambulate at least 30f using LRAD with +2 Min A with wide BOS. PT Short Term Goal 4 - Progress (Week 2): Met Week 3:  PT Short Term Goal 1 (Week 3): Pt will perform SPVT transfers using LRAD with consistent Min A. PT Short Term Goal 2 (Week 3): Pt will ambulate at least 243fusing LRAD with +2 Min A with wide BOS. PT Short Term Goal 3 (Week 3): pt will demonstrate dynamic standing balance with Min A/ CGA. PT Short Term Goal 4 (Week 3): Pt will propel w/c 150 feet with straight path and navigate turns with CGA.  Skilled Therapeutic Interventions/Progress Updates:   Received pt supine in bed, pt agreeable to therapy, and denied any pain during session. Session with emphasis on functional mobility/transfers, generalized strengthening, dynamic standing balance/coordination, NMR, family education, and improved activity tolerance. Pt transferred supine<>sitting EOB with CGA from semi-flat bed and transferred stand<>pivot bed<>WC with RW and mod A due to significant ataxia and difficulty placing L foot flat on floor; cues for hand placement when standing and pivoting. [facility] and donned L ted hose (pt refused R ted hose due to foot discomfort) and shoes with  max A for time management purposes. Sit<>stand at table in dayroom with min A x 2 trials. Worked on dynamic standing balance constructing pictures with pipes with emphasis on fine motor control/coordination, spatial awareness, and problem solving x 2 trials with min/mod A for balance with 100% accuracy but significant ataxia UE>LE. Pt bracing elbows on table and leaning forward to provide more stability. Performed seated BLE strengthening on Kinetron at 20 cm/sec for 1 minute x 2 trials increasing to 10 cm/sec for 1 minute x 2 trials with emphasis on coordination/control and quad/glute/core strengthening. [name] present for 2nd half of session with emphasis on family education and discharge planning. [facility] Educated pt's wife on transfer technique, hand placement, body mechanics/postioning, and use of gait belt for safety with transfers. Performed 2 stand<>pivot transfers with RW and min A provided by pt's wife with cues for hand placement, RW safety, and stepping technique/foot placement due to ataxia. Then performed sit<>stand with RW and CGA provided by pt's wife with emphasis on cues and safety. Concluded session with pt sitting in WC, needs within reach, and seatbelt alarm on awaiting OT session.   Therapy Documentation Precautions:  Precautions Precautions: Fall Precaution Comments: R hemiparesis, ataxic Other Brace: BUE resting hand splints, see schedule Restrictions Weight Bearing Restrictions: No  Therapy/Group: Individual Therapy AnAlfonse AlpersT, DPT   11/21/2020, 7:24 AM

## 2020-11-21 NOTE — Progress Notes (Signed)
Occupational Therapy Session Note  Patient Details  Name: Walter [name] MRN: [id number] Date of Birth: [birth year]  Today's Date: 11/21/2020 OT Individual Time: 1302-1403 OT Individual Time Calculation (min): 61 min    Short Term Goals: Week 3:  OT Short Term Goal 1 (Week 3): STG = LTG 2/2 ELOS  Skilled Therapeutic Interventions/Progress Updates:     Pt received seated in w/c, denies pain, agreeable to therapy. Session focus on BUE + core strengthening + decreased BUE ataxia in prep for improved func mobility/ADL performance. Self-propelled w/c to and form gym with light min A for obstacle navigation. Squat-pivot x3 throughout session with min A for balance. Completed massed practice of chest openers, triceps extension, overhead arm raises, ball toss, and core twists with 1 kg medicine ball + level 3 resitive theraband. Noted decrease in B ataxic movements, although pt with difficulty maintaining upright/stable posture during exercises. Transitioned to supine to complete mini cobras, then transitioned to quadruped with overall CGA. Pt able to complete peg design with 100% accuracy and increased time to place pegs with L hand 2/2 ataxic movements.   STS at toilet with min A for balance. Cont void of urine. Min A for balance during LB clothing management.    Pt left in w/c with safety belt alarm engaged, call bell in reach, and all immediate needs met.    Therapy Documentation Precautions:  Precautions Precautions: Fall Precaution Comments: R hemiparesis, ataxic Other Brace: BUE resting hand splints, see schedule Restrictions Weight Bearing Restrictions: No Pain: denies   ADL: See Care Tool for more details.  Therapy/Group: Individual Therapy  [name] MS, OTR/L  11/21/2020, 6:39 AM

## 2020-11-21 NOTE — Progress Notes (Signed)
Speech Language Pathology Daily Session Note  Patient Details  Name: Walter [name] MRN: [id number] Date of Birth: [birth year]  Today's Date: 11/21/2020 SLP Individual Time: 0815-0900 SLP Individual Time Calculation (min): 45 min  Short Term Goals: Week 3: SLP Short Term Goal 1 (Week 3): STGs=LTGs due to ELOS  Skilled Therapeutic Interventions: Skilled treatment session focused on cognitive goals. SLP facilitated session by readministering the [facility] Mental Status Examination (SLUMS). Patient scored  27/30 points with a score of 27 or above considered normal. Patient demonstrated a significant improvement since initial evaluation on 11/01/20 in which he scored 15/30 points. SLP also facilitated session by providing education regarding memory compensatory strategies and how to incorporate strategies at home.  Patient left upright in bed with alarm on and all needs within reach. Continue with current plan of care.       Pain No/Denies Pains  Therapy/Group: Individual Therapy  Lela Gell 11/21/2020, 12:11 PM

## 2020-11-21 NOTE — Progress Notes (Signed)
PROGRESS NOTE   Subjective/Complaints:  Slept poorly but looking forward to sleeping in his own bed in a couple nights  Constipated but does not want laxatives due to concerns about getting to [facility] in time   ROS: + Visual disturbance.  Denies CP, SOB, N/V/D  Objective:   No results found. Recent Labs    11/20/20 0621  WBC 4.4  HGB 12.0*  HCT 37.2*  PLT 290   Recent Labs    11/20/20 0621  NA 136  K 3.8  CL 102  CO2 26  GLUCOSE 116*  BUN 6*  CREATININE 0.80  CALCIUM 9.2    Intake/Output Summary (Last 24 hours) at 11/21/2020 9417 Last data filed at 11/21/2020 0459 Gross per 24 hour  Intake 494 ml  Output 975 ml  Net -481 ml     Pressure Injury 10/21/20 Head Left;Posterior Stage 1 -  Intact skin with non-blanchable redness of a localized area usually over a bony prominence. Pink non-blanchable scalp with thinning hair on posterior left side of head (Active)  10/21/20 1100  Location: Head  Location Orientation: Left;Posterior  Staging: Stage 1 -  Intact skin with non-blanchable redness of a localized area usually over a bony prominence.  Wound Description (Comments): Pink non-blanchable scalp with thinning hair on posterior left side of head  Present on Admission: No    Physical Exam: Vital Signs Blood pressure 134/83, pulse 74, temperature 98.3 F (36.8 C), temperature source Oral, resp. rate 17, weight 87.7 kg, SpO2 96 %. ENT- healed trach site    General: No acute distress  General: No acute distress Mood and affect are appropriate Heart: Regular rate and rhythm no rubs murmurs or extra sounds Lungs: Clear to auscultation, breathing unlabored, no rales or wheezes Abdomen: Positive bowel sounds, soft nontender to palpation, nondistended Extremities: No clubbing, cyanosis, or edema Skin: No evidence of breakdown, no evidence of rash   Neurologic: Cranial nerves II through XII intact, motor strength is  5/5 in bilateral deltoid, bicep, tricep, grip, hip flexor, knee extensors, ankle dorsiflexor and plantar flexor  Cerebellar exam moderately severe dysmetria Left  finger to nose to finger as well as heel to shin in bilateral upper and lower extremities Musculoskeletal: Full range of motion in all 4 extremities. No joint swelling    Assessment/Plan: 1. Functional deficits which require 3+ hours per day of interdisciplinary therapy in a comprehensive inpatient rehab setting.  Physiatrist is providing close team supervision and 24 hour management of active medical problems listed below.  Physiatrist and rehab team continue to assess barriers to discharge/monitor patient progress toward functional and medical goals  Care Tool:  Bathing    Body parts bathed by patient: Right arm,Left arm,Chest,Face,Right upper leg,Left upper leg,Abdomen,Front perineal area,Right lower leg,Left lower leg   Body parts bathed by helper: Buttocks,Right lower leg,Left lower leg     Bathing assist Assist Level: Minimal Assistance - Patient > 75%     Upper Body Dressing/Undressing Upper body dressing   What is the patient wearing?: Pull over shirt    Upper body assist Assist Level: Minimal Assistance - Patient > 75%    Lower Body Dressing/Undressing Lower body dressing  What is the patient wearing?: Pants,Underwear/pull up     Lower body assist Assist for lower body dressing: Moderate Assistance - Patient 50 - 74%     Toileting Toileting    Toileting assist Assist for toileting: Moderate Assistance - Patient 50 - 74%     Transfers Chair/bed transfer  Transfers assist  Chair/bed transfer activity did not occur: Safety/medical concerns  Chair/bed transfer assist level: Minimal Assistance - Patient > 75%     Locomotion Ambulation   Ambulation assist   Ambulation activity did not occur: Safety/medical concerns  Assist level: Moderate Assistance - Patient 50 - 74% Assistive  device: No Device Max distance: 55 ft   Walk 10 feet activity   Assist  Walk 10 feet activity did not occur: Safety/medical concerns  Assist level: Moderate Assistance - Patient - 50 - 74% Assistive device: No Device   Walk 50 feet activity   Assist Walk 50 feet with 2 turns activity did not occur: Safety/medical concerns  Assist level: Moderate Assistance - Patient - 50 - 74% Assistive device: No Device    Walk 150 feet activity   Assist Walk 150 feet activity did not occur: Safety/medical concerns         Walk 10 feet on uneven surface  activity   Assist Walk 10 feet on uneven surfaces activity did not occur: Safety/medical concerns         Wheelchair     Assist Will patient use wheelchair at discharge?: Yes Type of Wheelchair: Manual    Wheelchair assist level: Supervision/Verbal cueing Max wheelchair distance: 150'    Wheelchair 50 feet with 2 turns activity    Assist        Assist Level: Supervision/Verbal cueing   Wheelchair 150 feet activity     Assist      Assist Level: Supervision/Verbal cueing   Blood pressure 134/83, pulse 74, temperature 98.3 F (36.8 C), temperature source Oral, resp. rate 17, weight 87.7 kg, SpO2 96 %.  Medical Problem List and Plan: 1. Right side hemiparesis and slurred speech secondary to intracerebral hemorrhage complicated by hydrocephalus. Status post EVD placed per neurosurgery  Continue CIR- team conf in am 2. Acute PE/bilateral lower extremity DVT: Status post IVC filter 10/08/2020 with thrombectomy and retrieval of IVC filter 10/28/2020 per interventional radiology (still has suprarenal filter ) No RLE, scheduled for repeat thrombectomy to debulk clot from filter was successful no signs of post phlebitic syndrome -DVT/anticoagulation: Continue Eliquis-    CBC stable   5/7- per IR< also needs SCDs at all times in bed- IR went over with nursing. -antiplatelet therapy: N/A 3.  Pain Management: Decrease oxycodone to daily prn  Controlled on 5/24 4. Mood: Amantadine 100 mg twice daily, will d/c -antipsychotic agents: N/A 5. Neuropsych: This patient is not capable of making decisions on his own behalf. 6. Skin/Wound Care: Routine skin checks 7. Fluids/Electrolytes/Nutrition: Routine in and outs with follow-up chemistries 8. PAF. Status post ablation cardioversion in the Past. Continue Eliquis. Cardio exam 30 mg every 6 hours. Cardiac rate controlled diltiazem to 30mg   TID  Vitals:   11/20/20 1929 11/21/20 0326  BP: 120/76 134/83  Pulse: 87 74  Resp: 17 17  Temp: 98 F (36.7 C) 98.3 F (36.8 C)  SpO2: 95% 96%   Controlled on 5/24 9. Acute hypoxic respiratory failure. Resolved Tracheostomy 10/04/2020 decannulated 11/09/20  10. Hospital-acquired pneumonia/ventilator associated pneumonia. Infectious disease follow-up antibiotic therapy completed- resolved  11. Post stroke dysphagia. Continue Dysphagia #3 thin  liquids 12. Hyperglycemia related to tube feeds. Resolved, off lantus  CBG (last 3)  No results for input(s): GLUCAP in the last 72 hours.will d/c CBG not diabetic will be off TF- CBGs nl- d/ced 13.  RIght foot pain resolved  14.  Sleep disturbance  Reduced trazodone after discussion with Phrmacy regarding potential for bradycardia   Improved 15.  Dysuria: UA negative, UC with multiple species  LOS: 24 days A FACE TO FACE EVALUATION WAS PERFORMED  Erick Colace 11/21/2020, 7:38 AM

## 2020-11-21 NOTE — Progress Notes (Signed)
Occupational Therapy Session Note  Patient Details  Name: Walter [name] MRN: [id number] Date of Birth: 17-Apr-1956  Today's Date: 11/21/2020 OT Individual Time: 1131-1201 OT Individual Time Calculation (min): 30 min    Short Term Goals: Week 1:  OT Short Term Goal 1 (Week 1): Pt will complete BSC or toilet transfer with 1 assist and LRAD OT Short Term Goal 1 - Progress (Week 1): Not met OT Short Term Goal 2 (Week 1): Pt will engage in self care or therapeutic activity while sitting unsupported for ~20 minutes with no more than Mod balance assistance OT Short Term Goal 2 - Progress (Week 1): Met OT Short Term Goal 3 (Week 1): Pt will be able to visually scan for 1 ADL item and successfully reach for it with no more than Mod A OT Short Term Goal 3 - Progress (Week 1): Met Week 2:  OT Short Term Goal 1 (Week 2): Pt will complete STS in prep for standing ADL with mod A of 1. OT Short Term Goal 1 - Progress (Week 2): Met OT Short Term Goal 2 (Week 2): Pt will complete seated grooming with min A for task completion. OT Short Term Goal 2 - Progress (Week 2): Met OT Short Term Goal 3 (Week 2): Pt will complete BSC or toilet transfer with 1 assist and LRAD OT Short Term Goal 3 - Progress (Week 2): Met Week 3:  OT Short Term Goal 1 (Week 3): STG = LTG 2/2 ELOS  Skilled Therapeutic Interventions/Progress Updates:    Pt received in room with spouse present and consented to OT tx. Session focused on standing tolerance, balance, BUE manipulation, functional reach, visual scanning, and spouse education. Pt instructed in standing task in RW with card matching with CGA-min A while standing in RW. Pt took both hands off RW multiple times to manipulate cards and did not demo any loss of balance, however remains "wobbly" due to ataxic trunk. Pt req multiple tactile cuing throughout activity for proper hip alignment 2/2 coming into sway back/lumbar lordosis when standing. Pt requires cuing to slow down and for  safety during tasks. Pt demo'd good activity tolerance as he was able to complete task with no rest breaks. Educated spouse on pt's current status with strength and balance and safety. After tx, pt helped back to room, left in w/c with seatbelt alarm on with all needs met.    Therapy Documentation Precautions:  Precautions Precautions: Fall Precaution Comments: R hemiparesis, ataxic Other Brace: BUE resting hand splints, see schedule Restrictions Weight Bearing Restrictions: No    Pain: none     Therapy/Group: Individual Therapy  Aniqua Briere 11/21/2020, 12:20 PM

## 2020-11-21 NOTE — Progress Notes (Signed)
Patient ID: [name], male   DOB: Aug 29, 1955, 65 y.o.   MRN: [id number]   Wheelchair, Agricultural consultant and Bedside Commode ordered through [employer]. Pt spouse provided with [facility] resources.  [place], [place] [phone]

## 2020-11-22 MED ORDER — VITAMIN D 25 MCG (1000 UNIT) PO TABS: 1000.0000 [IU] | ORAL_TABLET | Freq: Every day | ORAL | 0 refills | Status: DC

## 2020-11-22 MED ORDER — DILTIAZEM HCL 30 MG PO TABS: 30.0000 mg | ORAL_TABLET | Freq: Three times a day (TID) | ORAL | 0 refills | Status: DC

## 2020-11-22 MED ORDER — TRAZODONE HCL 50 MG PO TABS: 25.0000 mg | ORAL_TABLET | Freq: Every day | ORAL | 0 refills | Status: DC

## 2020-11-22 MED ORDER — APIXABAN 5 MG PO TABS: 5.0000 mg | ORAL_TABLET | Freq: Two times a day (BID) | ORAL | 0 refills | Status: DC

## 2020-11-22 MED ORDER — CLONAZEPAM 0.125 MG PO TBDP: 0.1250 mg | ORAL_TABLET | Freq: Every evening | ORAL | 0 refills | Status: DC | PRN

## 2020-11-22 MED ORDER — DOXAZOSIN MESYLATE 2 MG PO TABS: 2.0000 mg | ORAL_TABLET | Freq: Every day | ORAL | 0 refills | Status: DC

## 2020-11-22 MED ORDER — ACETAMINOPHEN 325 MG PO TABS: 650.0000 mg | ORAL_TABLET | ORAL | Status: DC | PRN

## 2020-11-22 NOTE — Progress Notes (Signed)
PROGRESS NOTE   Subjective/Complaints:  No issues overnight , looking forward to d/c in am , double vision when looking to RIght improves with left eye closure  ROS: + Visual disturbance.  Denies CP, SOB, N/V/D  Objective:   No results found. Recent Labs    11/20/20 0621  WBC 4.4  HGB 12.0*  HCT 37.2*  PLT 290   Recent Labs    11/20/20 0621  NA 136  K 3.8  CL 102  CO2 26  GLUCOSE 116*  BUN 6*  CREATININE 0.80  CALCIUM 9.2    Intake/Output Summary (Last 24 hours) at 11/22/2020 0831 Last data filed at 11/22/2020 0500 Gross per 24 hour  Intake 480 ml  Output 1000 ml  Net -520 ml     Pressure Injury 10/21/20 Head Left;Posterior Stage 1 -  Intact skin with non-blanchable redness of a localized area usually over a bony prominence. Pink non-blanchable scalp with thinning hair on posterior left side of head (Active)  10/21/20 1100  Location: Head  Location Orientation: Left;Posterior  Staging: Stage 1 -  Intact skin with non-blanchable redness of a localized area usually over a bony prominence.  Wound Description (Comments): Pink non-blanchable scalp with thinning hair on posterior left side of head  Present on Admission: No    Physical Exam: Vital Signs Blood pressure 110/70, pulse 93, temperature 98.4 F (36.9 C), resp. rate 18, weight 87.9 kg, SpO2 100 %. ENT- healed trach site    General: No acute distress  General: No acute distress Mood and affect are appropriate Heart: Regular rate and rhythm no rubs murmurs or extra sounds Lungs: Clear to auscultation, breathing unlabored, no rales or wheezes Abdomen: Positive bowel sounds, soft nontender to palpation, nondistended Extremities: No clubbing, cyanosis, or edema Skin: No evidence of breakdown, no evidence of rash   Neurologic: Cranial nerves II through XII intact, motor strength is 5/5 in bilateral deltoid, bicep, tricep, grip, hip flexor, knee  extensors, ankle dorsiflexor and plantar flexor  Cerebellar exam moderately severe dysmetria Left  finger to nose to finger as well as heel to shin in bilateral upper and lower extremities Musculoskeletal: Full range of motion in all 4 extremities. No joint swelling    Assessment/Plan: 1. Functional deficits which require 3+ hours per day of interdisciplinary therapy in a comprehensive inpatient rehab setting.  Physiatrist is providing close team supervision and 24 hour management of active medical problems listed below.  Physiatrist and rehab team continue to assess barriers to discharge/monitor patient progress toward functional and medical goals  Care Tool:  Bathing    Body parts bathed by patient: Right arm,Left arm,Chest,Face,Right upper leg,Left upper leg,Abdomen,Front perineal area,Right lower leg,Left lower leg   Body parts bathed by helper: Buttocks,Right lower leg,Left lower leg     Bathing assist Assist Level: Minimal Assistance - Patient > 75%     Upper Body Dressing/Undressing Upper body dressing   What is the patient wearing?: Pull over shirt    Upper body assist Assist Level: Minimal Assistance - Patient > 75%    Lower Body Dressing/Undressing Lower body dressing      What is the patient wearing?: Pants,Underwear/pull up  Lower body assist Assist for lower body dressing: Moderate Assistance - Patient 50 - 74%     Toileting Toileting    Toileting assist Assist for toileting: Moderate Assistance - Patient 50 - 74%     Transfers Chair/bed transfer  Transfers assist  Chair/bed transfer activity did not occur: Safety/medical concerns  Chair/bed transfer assist level: Minimal Assistance - Patient > 75%     Locomotion Ambulation   Ambulation assist   Ambulation activity did not occur: Safety/medical concerns  Assist level: Moderate Assistance - Patient 50 - 74% Assistive device: No Device Max distance: 55 ft   Walk 10 feet  activity   Assist  Walk 10 feet activity did not occur: Safety/medical concerns  Assist level: Moderate Assistance - Patient - 50 - 74% Assistive device: No Device   Walk 50 feet activity   Assist Walk 50 feet with 2 turns activity did not occur: Safety/medical concerns  Assist level: Moderate Assistance - Patient - 50 - 74% Assistive device: No Device    Walk 150 feet activity   Assist Walk 150 feet activity did not occur: Safety/medical concerns         Walk 10 feet on uneven surface  activity   Assist Walk 10 feet on uneven surfaces activity did not occur: Safety/medical concerns         Wheelchair     Assist Will patient use wheelchair at discharge?: Yes Type of Wheelchair: Manual    Wheelchair assist level: Supervision/Verbal cueing Max wheelchair distance: 150'    Wheelchair 50 feet with 2 turns activity    Assist        Assist Level: Supervision/Verbal cueing   Wheelchair 150 feet activity     Assist      Assist Level: Supervision/Verbal cueing   Blood pressure 110/70, pulse 93, temperature 98.4 F (36.9 C), resp. rate 18, weight 87.9 kg, SpO2 100 %.  Medical Problem List and Plan: 1. Right side hemiparesis and slurred speech secondary to intracerebral hemorrhage complicated by hydrocephalus. Status post EVD placed per neurosurgery  Continue CIR-Team conference today please see physician documentation under team conference tab, met with team  to discuss problems,progress, and goals. Formulized individual treatment plan based on medical history, underlying problem and comorbidities. Plan D/C in am 5/26 2. Acute PE/bilateral lower extremity DVT: Status post IVC filter 10/08/2020 with thrombectomy and retrieval of IVC filter 10/28/2020 per interventional radiology (still has suprarenal filter ) No RLE, scheduled for repeat thrombectomy to debulk clot from filter was successful no signs of post phlebitic  syndrome -DVT/anticoagulation: Continue Eliquis-    CBC stable   5/7- per IR< also needs SCDs at all times in bed- IR went over with nursing. -antiplatelet therapy: N/A 3. Pain Management: Decrease oxycodone to daily prn  Controlled on 5/24 4. Mood: Amantadine 100 mg twice daily, will d/c -antipsychotic agents: N/A 5. Neuropsych: This patient is not capable of making decisions on his own behalf. 6. Skin/Wound Care: Routine skin checks 7. Fluids/Electrolytes/Nutrition: Routine in and outs with follow-up chemistries 8. PAF. Status post ablation cardioversion in the Past. Continue Eliquis. Cardio exam 30 mg every 6 hours. Cardiac rate controlled diltiazem to 54m  TID  Vitals:   11/21/20 2038 11/22/20 0410  BP: (!) 148/96 110/70  Pulse: (!) 58 93  Resp: 17 18  Temp: 98.4 F (36.9 C) 98.4 F (36.9 C)  SpO2: 100% 100%   Controlled on 5/25 9. Acute hypoxic respiratory failure. Resolved Tracheostomy 10/04/2020 decannulated 11/09/20  10.  Hospital-acquired pneumonia/ventilator associated pneumonia. Infectious disease follow-up antibiotic therapy completed- resolved  11. Post stroke dysphagia. Continue Dysphagia #3 thin liquids 12. Hyperglycemia related to tube feeds. Resolved, off lantus  CBG (last 3)  No results for input(s): GLUCAP in the last 72 hours.will d/c CBG not diabetic will be off TF- CBGs nl- d/ced 13.  RIght foot pain resolved  14.  Sleep disturbance  Reduced trazodone after discussion with Phrmacy regarding potential for bradycardia   Improved 15.  Dysuria: UA negative, UC with multiple species  LOS: 25 days A FACE TO [place] E Glynis Hunsucker 11/22/2020, 8:31 AM

## 2020-11-22 NOTE — Progress Notes (Signed)
Patient ID: Walter [name], male   DOB: 05-26-1956, 65 y.o.   MRN: [id number] PT [facility] referral sent to [facility]. SW waiting for follow up  Lavera Guise, [place] [phone]

## 2020-11-22 NOTE — Progress Notes (Signed)
Occupational Therapy Discharge Summary  Patient Details  Name: Walter [name] MRN: [id number] Date of Birth: 11-Apr-1956  Today's Date: 11/22/2020 OT Individual Time: 0701-0759 OT Individual Time Calculation (min): 58 min    Patient has met 6 of 8 long term goals due to improved activity tolerance, improved balance, postural control, ability to compensate for deficits, functional use of  RIGHT upper and LEFT upper extremity, improved attention, improved awareness and improved coordination.  Patient to discharge at [facility] Assist level.  Patient's care partner is independent to provide the necessary physical and cognitive assistance at discharge.  Rec 24/7 S and expect intermittent min physical A 2/2 decreased safety awareness/insight into deficits + BUE/BLE and trunkal ataxia.   Reasons goals not met: Pt cont to req intermittent CGA for STS + min A for balance during toilet transfers 2/2 decreased safety awareness and BUE/trunk ataxia. toilet  Recommendation:  Patient will benefit from ongoing skilled OT services in home health setting to continue to advance functional skills in the area of BADL, iADL and Reduce care partner burden.  Equipment: 3in1  Reasons for discharge: treatment goals met and discharge from hospital  Patient/family agrees with progress made and goals achieved: Yes  OT Discharge Precautions/Restrictions  Precautions Precautions: Fall Precaution Comments: R hemiparesis, ataxic Restrictions Weight Bearing Restrictions: No Pain   denies ADL ADL Eating: Supervision/safety Where Assessed-Eating: Wheelchair Grooming: Supervision/safety Where Assessed-Grooming: Sitting at sink Upper Body Bathing: Supervision/safety Where Assessed-Upper Body Bathing: Shower,Wheelchair Lower Body Bathing: Minimal assistance,Contact guard Where Assessed-Lower Body Bathing: Shower Upper Body Dressing: Supervision/safety Where Assessed-Upper Body Dressing: Wheelchair,Edge of  bed Lower Body Dressing: Minimal assistance Where Assessed-Lower Body Dressing: Edge of bed Toileting: Minimal assistance Where Assessed-Toileting: Glass blower/designer: Psychiatric nurse Method: Arts development officer: Raised toilet seat,Grab bars Tub/Shower Transfer: Not assessed Social research officer, government: Environmental education officer Method: Stand pivot,Squat pivot Youth worker: Radio broadcast assistant Vision Baseline Vision/History: Wears glasses Wears Glasses: At all times Patient Visual Report: Diplopia Vision Assessment?: Yes Eye Alignment: Within Functional Limits Ocular Range of Motion: Within Functional Limits Alignment/Gaze Preference: Within Defined Limits Tracking/Visual Pursuits: Decreased smoothness of horizontal tracking;Decreased smoothness of vertical tracking Saccades: Decreased speed of saccadic movement;Additional eye shifts occurred during testing Convergence: Impaired (comment) Diplopia Assessment: Disappears with one eye closed Perception  Perception: Impaired Spatial Orientation: BUE/BLE/trunk ataxia Praxis Praxis: Impaired Praxis Impairment Details: Motor planning Cognition Overall Cognitive Status: Impaired/Different from baseline Arousal/Alertness: Awake/alert Orientation Level: Oriented X4 Focused Attention: Appears intact Sustained Attention: Impaired Sustained Attention Impairment: Functional complex Memory: Impaired Memory Impairment: Decreased recall of new information;Decreased short term memory Awareness: Impaired Awareness Impairment: Emergent impairment Problem Solving: Impaired Problem Solving Impairment: Functional complex Behaviors: Impulsive Safety/Judgment: Impaired Sensation Sensation Light Touch: Appears Intact Hot/Cold: Appears Intact Proprioception: Impaired Detail Proprioception Impaired Details: Impaired LLE;Impaired LUE Stereognosis: Impaired Detail Stereognosis  Impaired Details: Impaired RUE;Impaired LUE Coordination Gross Motor Movements are Fluid and Coordinated: No Fine Motor Movements are Fluid and Coordinated: No Coordination and Movement Description: trunkal and R sided ataxia. greatly improved from Eval. Finger Nose Finger Test: moderate ataxia on the LUE 9 Hole Peg Test: 1 min 32 seconds on L; 1 min 17 secs R Motor  Motor Motor: Hemiplegia;Abnormal postural alignment and control;Ataxia Motor - Discharge Observations: mild R hemiparesis. moderate R sided ataxia UE>LE Mobility  Bed Mobility Bed Mobility: Supine to Sit Supine to Sit: Supervision/Verbal cueing Transfers Sit to Stand: Minimal Assistance - Patient > 75% Stand to Sit: Minimal  Assistance - Patient > 75%  Trunk/Postural Assessment  Cervical Assessment Cervical Assessment: Exceptions to WFL (Pt cont to req mod to max A to incorporate LUE as stabilizer.) Thoracic Assessment Thoracic Assessment: Exceptions to [facility] (rounded shoulders) Lumbar Assessment Lumbar Assessment: Exceptions to [facility] (posterior pelvic tilt) Postural Control Postural Control: Deficits on evaluation Trunk Control: trunkal ataxia  Balance Balance Balance Assessed: Yes Static Sitting Balance Static Sitting - Balance Support: No upper extremity supported Static Sitting - Level of Assistance: 5: Stand by assistance Dynamic Sitting Balance Dynamic Sitting - Balance Support: No upper extremity supported Dynamic Sitting - Level of Assistance: 5: Stand by assistance Static Standing Balance Static Standing - Balance Support: Bilateral upper extremity supported Static Standing - Level of Assistance: 5: Stand by assistance Dynamic Standing Balance Dynamic Standing - Balance Support: Bilateral upper extremity supported;During functional activity Dynamic Standing - Level of Assistance: 4: Min assist Extremity/Trunk Assessment RUE Assessment RUE Assessment: Exceptions to [facility] Active Range of Motion (AROM)  Comments: 3/4 to full shoulder flexion General Strength Comments: 4-/5 in shoulder flexion mild hemi/mildly ataxic, 45 lb grip strength LUE Assessment LUE Assessment: Exceptions to [facility] Active Range of Motion (AROM) Comments: 3/4 to full shoulder flexion General Strength Comments: 4+/5 grossly, moderate ataxic LUE movements; 60 lb grip strength  Session Note: Pt received semi-reclined in bed, denies pain, agreeable to therapy. DC reassessments completed as documented above. Donned underwear/pants bed level with close S, doffed brief close S. Came to sitting EOB close S with use of bed rail. Stand-pivot throughout session x3 with min A for balance and no AD. Brushed hair close S seated in w/c. Self-propelled w/c to and from gym with min A for obstacle navigation. Session focus on core strength, decreasing ataxic movements in UB/core, and B FMC. Readministrated 9HPT and reassessed B grip strength with above results. B grip strength noted to improved 10 lbs in R hand, 15 lbs in L, and 9HPT score in L hand decreased from 2 min 48 secs to 1 min 30 secs from initial assessment 5/14. Participated in various core strength/dynamic balance activities involving anterior and lateral weight shift with no LOB noted.    Pt left sitting in w/c with safety alarm engaged, call bell in reach, and all immediate needs met. Set-up for breakfast and NT notified of pt position.    [name] MS, OTR/L  11/22/2020, 6:41 AM

## 2020-11-22 NOTE — Progress Notes (Signed)
Patient ID: Walter [name], male   DOB: 07-09-1955, 65 y.o.   MRN: [id number] Team Conference Report to Patient/Family  Team Conference discussion was reviewed with the patient and caregiver, including goals, any changes in plan of care and target discharge date.  Patient and caregiver express understanding and are in agreement.  The patient has a target discharge date of 11/23/20.  Sw called pt spouse at bedside. Provided conference updates. Pt is medically stable to d/c. Spouse selected [facility] as HH company, sw will follow up  [name] 11/22/2020, 1:18 PM

## 2020-11-22 NOTE — Progress Notes (Signed)
Occupational Therapy Session Note  Patient Details  Name: Walter [name] MRN: [id number] Date of Birth: 09/30/1955  Today's Date: 11/22/2020 OT Individual Time: 1515-1600 OT Individual Time Calculation (min): 45 min    Short Term Goals: Week 1:  OT Short Term Goal 1 (Week 1): Pt will complete BSC or toilet transfer with 1 assist and LRAD OT Short Term Goal 1 - Progress (Week 1): Not met OT Short Term Goal 2 (Week 1): Pt will engage in self care or therapeutic activity while sitting unsupported for ~20 minutes with no more than Mod balance assistance OT Short Term Goal 2 - Progress (Week 1): Met OT Short Term Goal 3 (Week 1): Pt will be able to visually scan for 1 ADL item and successfully reach for it with no more than Mod A OT Short Term Goal 3 - Progress (Week 1): Met  Skilled Therapeutic Interventions/Progress Updates:    1:1. Pt received in bed agreeable to OT with wife present. Provided opportunity to go over any other education needs in prep for DC home but wife and pt content and requesting to practice adaptive yoga. Pt competles SPT throughout session with MIN A Overall and NO AD with 1UE of pt around OT shoulder to/from w/c. Maxi sky walking harness applied to pt and pt practices standing yoga postures (warrior 1, warrior 2, crescent lunge as well as extended mountain) to improve dynamic balance as well as return to leisure. Pt completes modified sun salutations at elevated mat in harness for DEEP WB into BUE (high plank>low push up>up dog>down dog) onto mat surface 5x with seated rest break in before second set. Pt more flat this date, but upon return to room bery grateful and appreciative to be able to practice yoga!! Exited session with pt seated in bed, exit alarm on and call light in reach.   Therapy Documentation Precautions:  Precautions Precautions: Fall Precaution Comments: R hemiparesis, ataxic Other Brace: BUE resting hand splints, see schedule Restrictions Weight  Bearing Restrictions: No General:   Vital Signs: Therapy Vitals Temp: 98.4 F (36.9 C) Pulse Rate: 93 Resp: 18 BP: 110/70 Patient Position (if appropriate): Lying Oxygen Therapy SpO2: 100 % O2 Device: Room Air Pain:   ADL: ADL Eating: Not assessed Grooming: Moderate assistance Where Assessed-Grooming: Edge of bed Upper Body Bathing: Moderate assistance Where Assessed-Upper Body Bathing: Edge of bed Lower Body Bathing: Dependent (+2 assist using Stedy for sit<stand) Where Assessed-Lower Body Bathing: Edge of bed,Other (Comment) (also sit<stand from BSC using Stedy) Upper Body Dressing: Maximal assistance Lower Body Dressing: Dependent (+2 assist using Stedy) Where Assessed-Lower Body Dressing: Edge of bed,Other (Comment) (sit<stand from BSC using Stedy) Toileting: Dependent (+2 using Stedy) Where Assessed-Toileting: Bedside Commode Toilet Transfer: Dependent (+2 using Stedy) Toilet Transfer Method: Stand pivot Toilet Transfer Equipment: Bedside commode Tub/Shower Transfer: Not assessed Vision   Perception    Praxis   Exercises:   Other Treatments:     Therapy/Group: Individual Therapy   M  11/22/2020, 6:55 AM  

## 2020-11-22 NOTE — Patient Care Conference (Signed)
Inpatient RehabilitationTeam Conference and Plan of Care Update Date: 11/22/2020   Time: 10:11 AM    Patient Name: Walter [name]      Medical Record Number: [id number]  Date of Birth: 30-Sep-1955 Sex: Male         Room/Bed: 4M05C/4M05C-01 Payor Info: Payor: MEDICARE / Plan: MEDICARE PART A AND B / Product Type: *No Product type* /    Admit Date/Time:  10/28/2020  5:33 PM  Primary Diagnosis:  ICH (intracerebral hemorrhage) [facility])  Hospital Problems: Principal Problem:   ICH (intracerebral hemorrhage) (HCC) Active Problems:   Tracheostomy care ([place])   Malnutrition of moderate degree   Thrombosis   Sleep disturbance   Dysphagia, post-stroke   PAF (paroxysmal atrial fibrillation) ([place])   Acute pulmonary embolism without acute cor pulmonale [facility])    Expected Discharge Date: Expected Discharge Date: 11/23/20  Team Members Present: Physician leading conference: Dr. Alysia Penna Care Coodinator Present: Dorien Chihuahua, RN, BSN, CRRN;Christina [place], [place] Nurse Present: Dorien Chihuahua, RN PT Present: Barrie Folk, PT OT Present: Other (comment) Providence Lanius, OT) SLP Present: Nadara Mode, SLP PPS Coordinator present : Gunnar Fusi, SLP     Current Status/Progress Goal Weekly Team Focus  Bowel/Bladder             Swallow/Nutrition/ Hydration   Regular texturs with thin liquids, Supervision  Supervision  Tolerance of upgrade, family education   ADL's   S UBD/grooming in w/c, min LBD for balance, toileting, toilet/shower transfers, full-body bathing seated  S to min A; will have met goals  NMR, static/dynamic sitting balance, functional transfers, activity tolerance, midline orientation   Mobility             Communication   Supervision for use of speech intelligibility strategies  Supervision  Family Education   Safety/Cognition/ Behavioral Observations  Min for complex, supervision for basic  Supervision  Family Education   Pain             Skin                Discharge Planning:  Pt to d/c home with spouse. Avaliable 24/7 2 level home, 5 steps to enter (able to maintain on main level)   Team Discussion: Patient with poor awareness of deficits and left side ataxia, Diplopia, trunchal ataxia and safety issues.  Patient on target to meet rehab goals: yes, currently min assist for lower body care , toileting and able to shower with assist. Min assist for transfers, and ambulating with min - mod assist. Min assist for complex problem solving  *See Care Plan and progress notes for long and short-term goals.   Revisions to Treatment Plan:   Teaching Needs: Safety measures, toileting, transfers, medications, secondary stroke risk management, etc.  Current Barriers to Discharge: Decreased caregiver support and Home enviroment access/layout  Possible Resolutions to Barriers: HH follow up services Private duty personal care services set up Family education completed     Medical Summary Current Status: Ataxia L side, diplopia  Barriers to Discharge: Other (comments)  Barriers to Discharge Comments: need to complete family training Possible Resolutions to Barriers/Weekly Focus: see above , DC in am   Continued Need for Acute Rehabilitation Level of Care: The patient requires daily medical management by a physician with specialized training in physical medicine and rehabilitation for the following reasons: Direction of a multidisciplinary physical rehabilitation program to maximize functional independence : Yes Medical management of patient stability for increased activity during participation in an intensive rehabilitation  regime.: Yes Analysis of laboratory values and/or radiology reports with any subsequent need for medication adjustment and/or medical intervention. : Yes   I attest that I was present, lead the team conference, and concur with the assessment and plan of the team.   Dorien Chihuahua B 11/22/2020, 1:30 PM

## 2020-11-22 NOTE — Progress Notes (Signed)
Patient ID: Walter [name], male   DOB: 11-06-1955, 65 y.o.   MRN: [id number]   Pt accepted by [facility].  [place], [place] [phone]

## 2020-11-22 NOTE — Progress Notes (Addendum)
Speech Language Pathology Discharge Summary  Patient Details  Name: Walter [name] MRN: [id number] Date of Birth: 11-22-1955  Today's Date: 11/22/2020 SLP Individual Time: 1435-1500 SLP Individual Time Calculation (min): 25 min   Skilled Therapeutic Interventions:  Skilled treatment session focused on completion of family education with the patient and his wife. Both were educated regarding patient's current cognitive functioning and strategies to utilize at home to maximize his attention, problem solving, recall and overall safety with functional and familiar tasks. Also educated patient regarding activities he can participate in home to stay cognitively engaged. Both verbalized understanding and handouts were given to reinforce information. Patient left supine in bed with wife present and alarm on. Continue with current plan of care.   Patient has met 8 of 8 long term goals.  Patient to discharge at overall Supervision;Min level.   Reasons goals not met: N/A   Clinical Impression/Discharge Summary: Patient has made excellent gains and has met 8 of 8 LTGs this admission. Currently, patient is consuming regular textures with thin liquids with minimal overt s/s of aspiration and overall Mod I for use of swallowing compensatory strategies.  Patient has been decannulated and is essentially 100% intelligible with intermittent verbal cues needed for use of an increased vocal intensity.  Patient demonstrates improved cognitive functioning and requires overall supervision-Min A verbal cues to complete functional and familiar tasks safely in regards to problem solving, recall, attention and awareness. Patient and family education is complete and patient will discharge home with 24 hour supervision. Patient would benefit from f/u SLP services to maximize his cognitive and swallowing functioning as well as his speech intelligibility to improve his overall functional independence and reduce caregiver burden.    Care Partner:  Caregiver Able to Provide Assistance: Yes  Type of Caregiver Assistance: Physical;Cognitive  Recommendation:  Home Health SLP;24 hour supervision/assistance  Rationale for SLP Follow Up: Maximize cognitive function and independence;Reduce caregiver burden   Equipment: N/A   Reasons for discharge: Discharged from hospital;Treatment goals met   Patient/Family Agrees with Progress Made and Goals Achieved: Yes    [name] 11/22/2020, 4:04 PM

## 2020-11-22 NOTE — Plan of Care (Signed)

## 2020-11-22 NOTE — Progress Notes (Signed)
Physical Therapy Discharge Summary  Patient Details  Name: Walter [name] MRN: [id number] Date of Birth: Dec 09, 1955  Today's Date: 11/22/2020 PT Individual Time: 0905-1000 and 1303-1330 PT Individual Time Calculation (min): 55 minand 27 min     Patient has met 9 of 9 long term goals due to improved activity tolerance, improved balance, improved postural control, increased strength, increased range of motion, ability to compensate for deficits, functional use of  right upper extremity, right lower extremity, left upper extremity and left lower extremity, improved attention, improved awareness and improved coordination.  Patient to discharge at an ambulatory level [place].   Patient's care partner is independent to provide the necessary physical and cognitive assistance at discharge.  Reasons goals not met: All PT goals Met    Recommendation:  Patient will benefit from ongoing skilled PT services in home health setting to continue to advance safe functional mobility, address ongoing impairments in balance, coordination, gait, transfers, attention, awareness, safety, and minimize fall risk.  Equipment: RW and WC  Reasons for discharge: treatment goals met and discharge from hospital  Patient/family agrees with progress made and goals achieved: Yes   PT treatment:  Session 1.  Pt received sitting in WC and agreeable to PT. PT instructed pt in Grad day assessment to measure progress toward goals. See below for details. Gait training with RW 117f, and 2076fwith min assist overall with bouts of mod assist for safety due to ppor AD managaement in turns and with fatigue. Stair management with side step technique and BUE support on rails as listed below with min assist and moderate cues/facilitation to improve BLE positioning on steps. Wife present throughout second half for treatment with education for car transfers as well as safety awareness for [facility]. [facility] returned to room  and [facility] and all needs met.     Session 2.  Pt received supine in bed and agreeable to PT. Supine>sit transfer with supervision assist cues for safety awareness at EOB. Pt instructed pt in HEP with handout provided of [place] level A. Sit<>stand with supervision assist and UE support on RW. Cues for safety, posture and use of BUE support on bed frame for safety throughout. Pt returned to room and performed ambulatory transfer to bed with min assist and RW. Sit>supine completed with supervision assist, and left supine in bed with call bell in reach and all needs met.      PT Discharge Pain Pain Assessment Pain Scale: 0-10 Pain Score: 0-No pain Vision/Perception  Vision - Assessment Eye Alignment: Within Functional Limits Ocular Range of Motion: Within Functional Limits Alignment/Gaze Preference: Within Defined Limits Tracking/Visual Pursuits: Decreased smoothness of horizontal tracking;Decreased smoothness of vertical tracking Saccades: Decreased speed of saccadic movement;Additional eye shifts occurred during testing Convergence: Impaired (comment) Diplopia Assessment: Disappears with one eye closed Perception Perception: Impaired Spatial Orientation: BUE/BLE/trunk ataxia Praxis Praxis: Impaired Praxis Impairment Details: Motor planning  Cognition Overall Cognitive Status: Impaired/Different from baseline Arousal/Alertness: Awake/alert Orientation Level: Oriented X4 Attention: Sustained Sustained Attention: Impaired Sustained Attention Impairment: Functional complex Memory: Appears intact Memory Impairment: Decreased recall of new information;Decreased short term memory Immediate Memory Recall: Sock;Blue;Bed Memory Recall Sock: Without Cue Memory Recall Blue: Without Cue Memory Recall Bed: Without Cue Awareness: Impaired Problem Solving: Impaired Behaviors: Impulsive Safety/Judgment: Impaired Comments: decreased insight into deficits  impacting safety Sensation Sensation Light Touch: Appears Intact Proprioception: Impaired Detail Proprioception Impaired Details: Impaired LLE;Impaired LUE Additional Comments: pt reports mild parasthesia  on the LLE Coordination Gross Motor Movements are Fluid and Coordinated: No Fine Motor Movements are Fluid and Coordinated: No Coordination and Movement Description: trunkal and R sided ataxia. greatly improved from Eval. Finger Nose Finger Test: moderate ataxia on the LUE Heel Shin Test: mild ataxia on the LLE. Motor  Motor Motor: Hemiplegia;Abnormal postural alignment and control;Ataxia Motor - Discharge Observations: mild R hemiparesis. moderate R sided ataxia UE>LE  Mobility Bed Mobility Bed Mobility: Supine to Sit Supine to Sit: Supervision/Verbal cueing Transfers Transfers: Sit to Stand;Stand to Sit;Stand Pivot Transfers Sit to Stand: Minimal Assistance - Patient > 75% Stand to Sit: Minimal Assistance - Patient > 75% Stand Pivot Transfers: Minimal Assistance - Patient > 75% Locomotion  Gait Ambulation: Yes Gait Assistance: Minimal Assistance - Patient > 75%;Moderate Assistance - Patient 50-74% Gait Distance (Feet): 170 Feet Assistive device: Rolling walker Gait Assistance Details: Verbal cues for sequencing;Verbal cues for technique;Verbal cues for precautions/safety;Manual facilitation for weight shifting;Verbal cues for safe use of DME/AE;Verbal cues for gait pattern;Tactile cues for weight shifting Gait Gait: Yes Gait Pattern: Impaired Gait Pattern: Ataxic;Narrow base of support Stairs / Additional Locomotion Stairs: Yes Stairs Assistance: Minimal Assistance - Patient > 75% Stair Management Technique: One rail Left;Sideways Number of Stairs: 12 Height of Stairs: 6 Ramp: Moderate Assistance - Patient 50 - 74% Wheelchair Mobility Wheelchair Mobility: Yes Wheelchair Assistance: Chartered loss adjuster: Both upper extremities Wheelchair  Parts Management: Needs assistance Distance: 200  Trunk/Postural Assessment  Cervical Assessment Cervical Assessment: Exceptions to [facility] (R rotation preference. able to correct with instruction) Thoracic Assessment Thoracic Assessment: Exceptions to [facility] Lumbar Assessment Lumbar Assessment: Exceptions to [facility] Postural Control Postural Control: Deficits on evaluation Trunk Control: trunkal ataxia Righting Reactions: mild delay in standing Postural Limitations: ataxia  Balance Balance Balance Assessed: Yes Static Sitting Balance Static Sitting - Balance Support: No upper extremity supported Static Sitting - Level of Assistance: 5: Stand by assistance Dynamic Sitting Balance Dynamic Sitting - Balance Support: No upper extremity supported Dynamic Sitting - Level of Assistance: 5: Stand by assistance Static Standing Balance Static Standing - Balance Support: Bilateral upper extremity supported Static Standing - Level of Assistance: 5: Stand by assistance Dynamic Standing Balance Dynamic Standing - Balance Support: Bilateral upper extremity supported;During functional activity Dynamic Standing - Level of Assistance: 4: Min assist Extremity Assessment      RLE Assessment RLE Assessment: Exceptions to [facility] Passive Range of Motion (PROM) Comments: WFL General Strength Comments: greatly improved from Eval RLE Strength Right Hip Flexion: 4+/5 Right Hip ABduction: 4+/5 Right Hip ADduction: 5/5 Right Knee Flexion: 4+/5 Right Knee Extension: 5/5 Right Ankle Dorsiflexion: 4+/5 Right Ankle Plantar Flexion: 4+/5 LLE Assessment LLE Assessment: Exceptions to [facility] Passive Range of Motion (PROM) Comments: WFL LLE Strength Left Hip Flexion: 4+/5 Left Hip ABduction: 5/5 Left Hip ADduction: 5/5 Left Knee Flexion: 4+/5 Left Knee Extension: 5/5 Left Ankle Dorsiflexion: 5/5 Left Ankle Plantar Flexion: 4+/5    [name] 11/22/2020, 11:07 AM

## 2020-11-23 NOTE — Progress Notes (Signed)
Inpatient Rehabilitation Care Coordinator Discharge Note  The overall goal for the admission was met for:   Discharge location: Yes, home  Length of Stay: Yes, 26 Days  Discharge activity level: Yes, ambulatory level Min Assist  Home/community participation: Yes  Services provided included: MD, RD, PT, OT, SLP, RN, CM, TR, Pharmacy, Neuropsych and SW  Financial Services: Medicare  Choices offered to/list presented to:pt and spouse  Follow-up services arranged: Home Health: [facility]  Comments (or additional information): PT OT ST Wheelchair, Rolling Walker, Bedside Commode  Patient/Family verbalized understanding of follow-up arrangements: Yes  Individual responsible for coordination of the follow-up plan: [name] [phone]  Confirmed correct DME delivered: Dyanne Iha 11/23/2020    Dyanne Iha

## 2020-11-23 NOTE — Plan of Care (Signed)
  Problem: Sit to Stand Goal: LTG:  Patient will perform sit to stand in prep for activites of daily living with assistance level (OT) Description: LTG:  Patient will perform sit to stand in prep for activites of daily living with assistance level (OT) Outcome: Not Met (add Reason) Flowsheets (Taken 11/23/2020 0647) LTG: PT will perform sit to stand in prep for activites of daily living with assistance level: (Not met as pt still req intermittent CGA 2/2 trunkal ataxia and decreased safety awareness.) -- Note: Not met as pt still req intermittent CGA 2/2 trunkal ataxia and decreased safety awareness.   Problem: RH Toilet Transfers Goal: LTG Patient will perform toilet transfers w/assist (OT) Description: LTG: Patient will perform toilet transfers with assist, with/without cues using equipment (OT) Outcome: Not Met (add Reason) Flowsheets (Taken 11/23/2020 0647) LTG: Pt will perform toilet transfers with assistance level of: (Not met as pt still req intermittent CGA 2/2 trunkal ataxia and decreased safety awareness.) -- Note: Not met as pt still req intermittent CGA 2/2 trunkal ataxia and decreased safety awareness.   Problem: RH Eating Goal: LTG Patient will perform eating w/assist, cues/equip (OT) Description: LTG: Patient will perform eating with assist, with/without cues using equipment (OT) Outcome: Completed/Met   Problem: RH Grooming Goal: LTG Patient will perform grooming w/assist,cues/equip (OT) Description: LTG: Patient will perform grooming with assist, with/without cues using equipment (OT) Outcome: Completed/Met   Problem: RH Bathing Goal: LTG Patient will bathe all body parts with assist levels (OT) Description: LTG: Patient will bathe all body parts with assist levels (OT) Outcome: Completed/Met   Problem: RH Dressing Goal: LTG Patient will perform upper body dressing (OT) Description: LTG Patient will perform upper body dressing with assist, with/without cues  (OT). Outcome: Completed/Met Goal: LTG Patient will perform lower body dressing w/assist (OT) Description: LTG: Patient will perform lower body dressing with assist, with/without cues in positioning using equipment (OT) Outcome: Completed/Met   Problem: RH Toileting Goal: LTG Patient will perform toileting task (3/3 steps) with assistance level (OT) Description: LTG: Patient will perform toileting task (3/3 steps) with assistance level (OT)  Outcome: Completed/Met

## 2020-11-28 ENCOUNTER — Telehealth: Payer: Self-pay

## 2020-11-28 NOTE — Telephone Encounter (Signed)
Discharge notes reviewed: Verbal okay given for in-home occupational therapy 1 week 2, 0 week 1, 1 week 3, 1 week 1, 1 week 1. To work on ADL, IADL, exercise & transfers.

## 2020-11-30 ENCOUNTER — Encounter: Payer: Medicare Other | Attending: Physical Medicine & Rehabilitation | Admitting: Physical Medicine & Rehabilitation

## 2020-11-30 ENCOUNTER — Other Ambulatory Visit: Payer: Self-pay

## 2020-11-30 ENCOUNTER — Encounter: Payer: Self-pay | Admitting: Physical Medicine & Rehabilitation

## 2020-11-30 VITALS — BP 135/78 | HR 97 | Temp 98.3°F | Ht 74.0 in | Wt 200.0 lb

## 2020-11-30 DIAGNOSIS — I629 Nontraumatic intracranial hemorrhage, unspecified: Secondary | ICD-10-CM | POA: Diagnosis present

## 2020-11-30 MED ORDER — MODAFINIL 100 MG PO TABS: 100.0000 mg | ORAL_TABLET | Freq: Every day | ORAL | 1 refills | Status: DC

## 2020-11-30 NOTE — Progress Notes (Signed)
Subjective:    Patient ID: Walter [name], male    DOB: Jan 26, 1956, 65 y.o.   MRN: [id number] 65 y.o. right-handed male with history of atrial fibrillation with ablation not on anticoagulation.  Lives with spouse independent prior to admission working as a Veterinary surgeon.  Presented 09/17/2020 with right side weakness headache and dysarthria as well as vomiting.  Cranial CT/MRI showed acute intraparenchymal hemorrhage in the dorsal left pons with subarachnoid extension into the cerebral aqueduct and fourth ventricle.  CT angiogram head and neck no vascular malformation anomaly or aneurysm.  Echocardiogram with ejection fraction of 55 to 60% grade 1 diastolic dysfunction no regional wall motion abnormalities.  Admission chemistries unremarkable except glucose 169 alcohol negative urine drug screen negative.  Patient did require emergent intubation for airway protection.  Hospital course complicated by hydrocephalus with diagnostic cerebral angiogram completed showing no evidence of aneurysm.  EVD was placed per neurosurgery.  Maintained on 3% hypertonic saline.  Patient extubated 09/28/2020.  Follow-up cardiology services for atrial fibrillation maintained on Cardizem.  Patient was cleared to begin Lovenox for DVT prophylaxis 09/28/2020.  Repeat head CT scan 09/29/2020 showed new intraventricular hemorrhage into the third and lateral ventricles.  Fourth ventricular and midbrain hemorrhage unchanged and again repeated 10/24/2020 showing improved hemorrhage and edema along the shunt tract in the right frontal lobe.  No new area of hemorrhage.  Patient with long-term ventilatory support underwent tracheostomy 10/04/2020 per Dr. Merrily Pew critical care services.  On 10/08/2020 patient found to have bilateral lower extremity DVTs underwent placement of IVC filter per interventional radiology.  Dopplers again completed 10/25/2020 showing acute DVT right common femoral vein and SF junction and femoral vein proximal right profunda vein  right popliteal vein and right posterior tibial vein right peroneal vein right soleal vein and right gastrocnemius vein it was established the patient had an IVC filter maintained on Eliquis which was also initiated.  Patient with fever of unknown origin infectious disease consulted broad-spectrum antibiotics initiated.  SARS coronavirus negative.  CT chest abdomen pelvis did show bilateral large volume right greater than left pulmonary emboli no evidence of right heart strain and was discussed to continue IVC filter as well as Eliquis.  Interventional radiology consulted to discuss possible removal of IVC filter with CT venogram 10/27/2020 showing thrombus involving the IVC and iliac veins.  Large amount of thrombus involving the infrarenal IVC and IVC filter.  Suprarenal IVC was patent.  Thrombus in the right iliac veins and thrombus in the proximal femoral veins bilaterally and plan was to undergo thrombectomy and retrieval of IVC filter 10/27/2020 per Dr. Deanne Coffer of interventional radiology.  Patient initially n.p.o. with alternative means of nutritional support diet slowly advance.    Admit date: 10/28/2020 Discharge date: 11/23/2020  HPI Patient discharged to home last week.  He is living with his wife, also has a hired Hydrographic surveyor.  His wife does not feel like this will be needed for the full month. Wife is noted patient stays very fatigued.  He is up most of the day.  He has been to an eye doctor appointment this morning and at this appointment this afternoon. He requires assistance with dressing and bathing and toileting.  He is nonambulatory at this time although doing some pretty gait and gait activities with therapy Currently receiving home health therapy services Seen by Dr Dione Booze , will see him in 6 weeks  Using urinal     Pain Inventory Average Pain 5 Pain Right Now 5 My  pain is sharp  LOCATION OF PAIN  Rib cage  BOWEL Number of stools per week: 7 Oral laxative use No  Type of  laxative na Enema or suppository use No  History of colostomy No  Incontinent No   BLADDER Normal and Pads In and out cath, frequency na Able to self cath na Bladder incontinence No  Frequent urination No  Leakage with coughing No  Difficulty starting stream No  Incomplete bladder emptying No    Mobility use a walker ability to climb steps?  yes do you drive?  no use a wheelchair needs help with transfers  Function disabled: date disabled 08/2020 I need assistance with the following:  dressing, bathing, meal prep, household duties and shopping  Neuro/Psych weakness tingling trouble walking confusion depression anxiety  Prior Studies Any changes since last visit?  no  Physicians involved in your care Primary care Dr. Clovis Riley Opthalmalogist   Family History  Problem Relation Age of Onset  . Heart disease Mother   . Heart disease Father   . Healthy Sister   . Healthy Brother    Social History   Socioeconomic History  . Marital status: Married    Spouse name: Not on file  . Number of children: Not on file  . Years of education: Not on file  . Highest education level: Not on file  Occupational History  . Not on file  Tobacco Use  . Smoking status: Never Smoker  . Smokeless tobacco: Never Used  Substance and Sexual Activity  . Alcohol use: Yes    Comment: occasionally  . Drug use: No  . Sexual activity: Not on file  Other Topics Concern  . Not on file  Social History Narrative   Pt lives in [place].  Works as a Copywriter, advertising for [employer].   Married. 2 children   Social Determinants of Health   Financial Resource Strain: Not on file  Food Insecurity: Not on file  Transportation Needs: Not on file  Physical Activity: Not on file  Stress: Not on file  Social Connections: Not on file   Past Surgical History:  Procedure Laterality Date  . A-FLUTTER ABLATION N/A 03/09/2020   Procedure: A-FLUTTER ABLATION;  Surgeon: Hillis Range, MD;   Location: [facility] INVASIVE CV LAB;  Service: Cardiovascular;  Laterality: N/A;  . CARDIOVERSION N/A 09/27/2013   Procedure: CARDIOVERSION;  Surgeon: Lewayne Bunting, MD;  Location: [facility] ENDOSCOPY;  Service: Cardiovascular;  Laterality: N/A;  . IR ANGIO EXTERNAL CAROTID SEL EXT CAROTID UNI R MOD SED  09/26/2020  . IR ANGIO INTRA EXTRACRAN SEL COM CAROTID INNOMINATE BILAT MOD SED  09/26/2020  . IR ANGIO VERTEBRAL SEL VERTEBRAL UNI R MOD SED  09/26/2020  . IR IVC FILTER PLMT / S&I /IMG GUID/MOD SED  10/08/2020  . IR IVUS EACH ADDITIONAL NON CORONARY VESSEL  11/02/2020  . IR PTA VENOUS EXCEPT DIALYSIS CIRCUIT  11/02/2020  . IR THROMBECT VENO MECH MOD SED  10/27/2020  . IR THROMBECT VENO MECH MOD SED  11/02/2020  . IR US GUIDE VASC ACCESS LEFT  10/27/2020  . IR US GUIDE VASC ACCESS RIGHT  09/26/2020  . IR US GUIDE VASC ACCESS RIGHT  11/02/2020  . IR VENO/EXT/UNI RIGHT  11/02/2020  . IR VENOCAVAGRAM IVC  10/27/2020  . RETINAL DETACHMENT SURGERY    . right knee arthroscopy    . TEE WITHOUT CARDIOVERSION N/A 09/27/2013   Procedure: TRANSESOPHAGEAL ECHOCARDIOGRAM (TEE);  Surgeon: Lewayne Bunting, MD;  Location: [facility] ENDOSCOPY;  Service: Cardiovascular;  Laterality: N/A;  . WRIST SURGERY     Past Medical History:  Diagnosis Date  . Atrial flutter (HCC)   . Dizzy 09/25/13  . Near syncope 09/25/13   BP 135/78   Pulse 97   Temp 98.3 F (36.8 C)   Ht 6\' 2"  (1.88 m)   Wt 200 lb (90.7 kg)   SpO2 94%   BMI 25.68 kg/m   Opioid Risk Score:   Fall Risk Score:  `1  Depression screen PHQ 2/9  No flowsheet data found.  Review of Systems  Musculoskeletal: Positive for gait problem.       Rib cage pain  Psychiatric/Behavioral: Positive for confusion and dysphoric mood. The patient is nervous/anxious.   All other systems reviewed and are negative.      Objective:   Physical Exam Vitals and nursing note reviewed.  Constitutional:      Appearance: He is obese.  HENT:     Head: Normocephalic and atraumatic.  Eyes:      Extraocular Movements: Extraocular movements intact.     Pupils: Pupils are equal, round, and reactive to light.  Musculoskeletal:        General: No tenderness.  Skin:    General: Skin is warm and dry.  Neurological:     Mental Status: He is alert. Mental status is at baseline.     Cranial Nerves: Dysarthria present.     Motor: No abnormal muscle tone or seizure activity.     Coordination: Coordination abnormal. Impaired rapid alternating movements.     Gait: Gait abnormal.     Comments: Patient with reduced left adduction causing double vision Motor strength is 4+ left deltoid bicep tricep grip hip flexor knee extensor ankle dorsiflexor 5/5 on the right side There is moderate dysmetria left finger-nose-finger  Impaired rapid alternating supination pronation bilateral upper limbs  Psychiatric:        Attention and Perception: Attention normal.        Mood and Affect: Affect is blunt.        Speech: Speech is delayed and slurred.        Behavior: Behavior is slowed.        Cognition and Memory: Memory is impaired.           Assessment & Plan:  #1.  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  He had additional complications including extensive DVT in the right lower extremity requiring IVC filter as well as percutaneous thrombectomy The patient appears to be at a stable neurologic level compared to last week.  His fatigue is likely due to increasing activity levels.  Given location of CVA is not unusual to see if fatigue is part of the overall stroke symptomatology.  We will trial Provigil 100 mg/day may need to bump up to 200 mg next month Continue home health PT may need to transition outpatient next month Discussed the transport to study at [facility] utilizing transcranial direct current stimulation in conjunction with constraint induced movement therapy which needs to be done  prior to the 64-month mark  The patient will be referred to Dr. 8-month for stroke follow-up. The patient all ready appropriately followed up with primary care as well as ophthalmology, has plans to follow-up with cardiology later this month

## 2020-11-30 NOTE — Patient Instructions (Signed)
Will use modafinil for fatique may need to increase dose at next visit

## 2020-12-04 MED ORDER — MODAFINIL 100 MG PO TABS: 100.0000 mg | ORAL_TABLET | Freq: Every day | ORAL | 1 refills | Status: DC

## 2020-12-07 ENCOUNTER — Telehealth: Payer: Self-pay | Admitting: *Deleted

## 2020-12-07 ENCOUNTER — Encounter: Payer: Medicare Other | Admitting: Physical Medicine & Rehabilitation

## 2020-12-07 MED ORDER — ARMODAFINIL 150 MG PO TABS: 150.0000 mg | ORAL_TABLET | Freq: Every day | ORAL | 0 refills | Status: DC

## 2020-12-07 NOTE — Telephone Encounter (Signed)
Modafanil was DENIED by insurance.  Patient must try the formulary alternative Armodafinil 1st before modafinil will be considered

## 2020-12-09 DIAGNOSIS — I69193 Ataxia following nontraumatic intracerebral hemorrhage: Secondary | ICD-10-CM

## 2020-12-09 DIAGNOSIS — I613 Nontraumatic intracerebral hemorrhage in brain stem: Secondary | ICD-10-CM

## 2020-12-09 DIAGNOSIS — I69391 Dysphagia following cerebral infarction: Secondary | ICD-10-CM

## 2020-12-12 ENCOUNTER — Ambulatory Visit (INDEPENDENT_AMBULATORY_CARE_PROVIDER_SITE_OTHER): Payer: Medicare Other | Admitting: Otolaryngology

## 2020-12-12 ENCOUNTER — Other Ambulatory Visit: Payer: Self-pay

## 2020-12-12 DIAGNOSIS — R490 Dysphonia: Secondary | ICD-10-CM | POA: Diagnosis not present

## 2020-12-12 NOTE — Progress Notes (Signed)
HPI: Walter [name] is a 65 y.o. male who presents is referred by speech pathology for evaluation of vocal cords.  Patient is status post an intracerebral hemorrhage 2 and half months ago which required prolonged hospitalization as well as tracheostomy after several reintubation's and being on the ventilator for several weeks.  The tracheostomy was subsequently removed and patient is undergoing rehab.  He is scheduled to get speech pathology rehab as an outpatient and they recommended having ENT evaluation of the vocal cords. Patient presents today in a wheelchair with his wife.  He has a very weak voice but he is easily understandable..  Past Medical History:  Diagnosis Date   Atrial flutter (HCC)    Dizzy 09/25/13   Near syncope 09/25/13   Past Surgical History:  Procedure Laterality Date   A-FLUTTER ABLATION N/A 03/09/2020   Procedure: A-FLUTTER ABLATION;  Surgeon: Hillis Range, MD;  Location: [facility] INVASIVE CV LAB;  Service: Cardiovascular;  Laterality: N/A;   CARDIOVERSION N/A 09/27/2013   Procedure: CARDIOVERSION;  Surgeon: Lewayne Bunting, MD;  Location: [facility] ENDOSCOPY;  Service: Cardiovascular;  Laterality: N/A;   IR ANGIO EXTERNAL CAROTID SEL EXT CAROTID UNI R MOD SED  09/26/2020   IR ANGIO INTRA EXTRACRAN SEL COM CAROTID INNOMINATE BILAT MOD SED  09/26/2020   IR ANGIO VERTEBRAL SEL VERTEBRAL UNI R MOD SED  09/26/2020   IR IVC FILTER PLMT / S&I /IMG GUID/MOD SED  10/08/2020   IR IVUS EACH ADDITIONAL NON CORONARY VESSEL  11/02/2020   IR PTA VENOUS EXCEPT DIALYSIS CIRCUIT  11/02/2020   IR THROMBECT VENO MECH MOD SED  10/27/2020   IR THROMBECT VENO MECH MOD SED  11/02/2020   IR US GUIDE VASC ACCESS LEFT  10/27/2020   IR US GUIDE VASC ACCESS RIGHT  09/26/2020   IR US GUIDE VASC ACCESS RIGHT  11/02/2020   IR VENO/EXT/UNI RIGHT  11/02/2020   IR VENOCAVAGRAM IVC  10/27/2020   RETINAL DETACHMENT SURGERY     right knee arthroscopy     TEE WITHOUT CARDIOVERSION N/A 09/27/2013   Procedure: TRANSESOPHAGEAL  ECHOCARDIOGRAM (TEE);  Surgeon: Lewayne Bunting, MD;  Location: [facility] ENDOSCOPY;  Service: Cardiovascular;  Laterality: N/A;   WRIST SURGERY     Social History   Socioeconomic History   Marital status: Married    Spouse name: Not on file   Number of children: Not on file   Years of education: Not on file   Highest education level: Not on file  Occupational History   Not on file  Tobacco Use   Smoking status: Never   Smokeless tobacco: Never  Substance and Sexual Activity   Alcohol use: Yes    Comment: occasionally   Drug use: No   Sexual activity: Not on file  Other Topics Concern   Not on file  Social History Narrative   Pt lives in [place].  Works as a Copywriter, advertising for [employer].   Married. 2 children   Social Determinants of Health   Financial Resource Strain: Not on file  Food Insecurity: Not on file  Transportation Needs: Not on file  Physical Activity: Not on file  Stress: Not on file  Social Connections: Not on file   Family History  Problem Relation Age of Onset   Heart disease Mother    Heart disease Father    Healthy Sister    Healthy Brother    Allergies  Allergen Reactions   Keppra [Levetiracetam] Swelling    Patient experienced angioedema  post inpatient keppra dose   Latex Itching    Skin turns real red   Prior to Admission medications   Medication Sig Start Date End Date Taking? Authorizing Provider  acetaminophen (TYLENOL) 325 MG tablet Take 2 tablets (650 mg total) by mouth every 4 (four) hours as needed for mild pain or fever. 11/22/20   Angiulli, Mcarthur Rossetti, PA-C  apixaban (ELIQUIS) 5 MG TABS tablet Take 1 tablet (5 mg total) by mouth 2 (two) times daily. 11/22/20   Angiulli, Mcarthur Rossetti, PA-C  Armodafinil 150 MG tablet Take 150 mg by mouth daily.    [provider]  Armodafinil 150 MG tablet Take 1 tablet (150 mg total) by mouth daily. 12/07/20   Kirsteins, Victorino Sparrow, MD  cholecalciferol (VITAMIN D3) 25 MCG (1000 UNIT) tablet Take 1  tablet (1,000 Units total) by mouth daily. 11/22/20   Angiulli, Mcarthur Rossetti, PA-C  clonazepam (KLONOPIN) 0.125 MG disintegrating tablet Take 1 tablet (0.125 mg total) by mouth at bedtime as needed (sleep). 11/22/20   Angiulli, Mcarthur Rossetti, PA-C  diltiazem (CARDIZEM) 30 MG tablet Take 1 tablet (30 mg total) by mouth every 8 (eight) hours. 11/22/20   Angiulli, Mcarthur Rossetti, PA-C  doxazosin (CARDURA) 2 MG tablet Take 1 tablet (2 mg total) by mouth daily. 11/22/20   Angiulli, Mcarthur Rossetti, PA-C  modafinil (PROVIGIL) 100 MG tablet Take 1 tablet (100 mg total) by mouth daily. 12/04/20   Kirsteins, Victorino Sparrow, MD  sertraline (ZOLOFT) 50 MG tablet Take 50 mg by mouth daily.    [provider]  traZODone (DESYREL) 50 MG tablet Take 0.5 tablets (25 mg total) by mouth at bedtime. 11/22/20   Angiulli, Mcarthur Rossetti, PA-C     Positive ROS: Otherwise negative  All other systems have been reviewed and were otherwise negative with the exception of those mentioned in the HPI and as above.  Physical Exam: Constitutional: Alert, well-appearing, no acute distress. Ears: External ears without lesions or tenderness. Ear canals are clear bilaterally with intact, clear TMs.  Nasal: External nose without lesions. Septum with moderate deformity.  After decongesting and anesthetizing the left side the nose fiberoptic laryngoscopy was passed through the left nostril without any difficulty..  The nasopharynx was clear.  The base of tongue vallecula epiglottis were normal.  Vocal cords were clear bilaterally with normal vocal cord mobility bilaterally.  He has some slight edema of the posterior vocal cords where the arytenoids meet posteriorly most likely from prolonged intubation but there was no vocal cord pathology noted and vocal cords had symmetric mobility. Oral: Lips and gums without lesions. Tongue and palate mucosa without lesions. Posterior oropharynx clear. Neck: No palpable adenopathy or masses.  Patient has a well-healed  tracheostomy scar. Respiratory: Breathing comfortably  Skin: No facial/neck lesions or rash noted.  Laryngoscopy  Date/Time: 12/12/2020 6:12 PM Performed by: Drema Halon, MD Authorized by: Drema Halon, MD   Consent:    Consent obtained:  Verbal   Consent given by:  Patient Procedure details:    Indications: evaluation of larynx and immediate subglottis and hoarseness, dysphagia, or aspiration     Medication:  Afrin   Instrument: flexible fiberoptic laryngoscope     Scope location: left nare   Sinus:    Left nasopharynx: normal   Mouth:    Vallecula: normal     Base of tongue: normal     Epiglottis: normal   Throat:    True vocal cords: normal   Comments:  On fiberoptic laryngoscopy the vocal cords were clear bilaterally with normal vocal cord mobility.  He had mild edema of the posterior vocal cords over the arytenoid processes but this was minimal and symmetric.   Assessment: His voice is weak secondary to his overall weakness but he has no significant vocal cord pathology with normal vocal mobility bilaterally.   Plan: His voice should gradually strengthen with his overall increase strength with rehab as well as speech pathology rehab.   Narda Bonds, MD   CC:

## 2020-12-14 ENCOUNTER — Ambulatory Visit (INDEPENDENT_AMBULATORY_CARE_PROVIDER_SITE_OTHER): Payer: Medicare Other | Admitting: Neurology

## 2020-12-14 ENCOUNTER — Encounter: Payer: Self-pay | Admitting: Neurology

## 2020-12-14 ENCOUNTER — Other Ambulatory Visit: Payer: Self-pay

## 2020-12-14 VITALS — BP 129/78 | HR 90 | Ht 72.0 in | Wt 190.0 lb

## 2020-12-14 DIAGNOSIS — I69193 Ataxia following nontraumatic intracerebral hemorrhage: Secondary | ICD-10-CM | POA: Diagnosis not present

## 2020-12-14 DIAGNOSIS — I613 Nontraumatic intracerebral hemorrhage in brain stem: Secondary | ICD-10-CM | POA: Diagnosis not present

## 2020-12-14 DIAGNOSIS — H532 Diplopia: Secondary | ICD-10-CM | POA: Diagnosis not present

## 2020-12-14 MED ORDER — TRAZODONE HCL 50 MG PO TABS: 100.0000 mg | ORAL_TABLET | Freq: Every day | ORAL | 0 refills | Status: DC

## 2020-12-14 NOTE — Progress Notes (Signed)
[facility] [address] [place]. [place] [address] [phone]       OFFICE FOLLOW-UP NOTE  [name] Date of Birth:  Dec 04, 1955 Medical Record Number:  [id number]   HPI: Walter [name] is a 65 year old pleasant Caucasian male seen today for initial office follow-up visit following hospital admission for intracerebral hemorrhage in March 2022.  History is obtained from  patient and his wife and review of electronic medical records and personally reviewed pertinent imaging films in PACS.  He has past medical history of atrial flutter and newly diagnosed A. fib in February 2022 not on anticoagulation who presented on 09/25/2020 upon awakening from sleep with discomfort in his head and ear and slurred speech.  Upon arrival he was drowsy and sleepy with blood pressure in the 140-150 systolic range.  He required emergent intubation due to inability to protect his airway with a Glasgow Coma Scale of 5.  NIH stroke scale was 22.  He was also found to have right-sided weakness.  CT scan showed a left midbrain hemorrhage involving the dorsal part of the pons as well with extension into the cerebral aqueduct and fourth ventricle with local mass-effect on the brainstem but no hydrocephalus.  CT angiogram of the head and neck showed no large vessel occlusion or AV malformation.  ICH score was 3.  Patient was admitted to the intensive care unit.  He was started on hypertonic saline and had repeat CT scan which showed worsening hydrocephalus and neurosurgery was consulted who placed a right frontal ventriculostomy catheter.  MRI scan of the brain was obtained which confirmed acute hemorrhage within the left dorsal midbrain and pons extending to inferior medial thalamus with intraventricular extension.  There is no abnormal enhancement to suggest underlying structural or vascular lesion.  2D echo showed normal ejection fraction.  LDL cholesterol is 88 mg percent.  Hemoglobin A1c was 5.7.   Cerebral catheter angiogram was obtained and 09/27/2018 emergently which showed no evidence of AVM on formation, venous sinus thrombosis vascular abnormalities.  Patient showed neurological improvement after the EVD was placed and was following commands and opening his eyes.  The patient subsequent hospital course was complicated by development of deep vein thrombosis and pulmonary embolism eventually requiring to be started on anticoagulation.  He also required a tracheostomy and placement of IVC filter.  Long-term EEG monitoring showed triphasics but no definite seizure activity.  He was eventually weaned off ventilatory support and transferred to inpatient rehab on 10/26/2020 where he stayed for 4 weeks and made gradual improvement and was discharged home on 11/23/2020.  Patient was initially scheduled to see me in 2 months but the patient's wife had several questions about his follow-up care and I will prompting him to be seen sooner today.  Patient is currently at home he is getting home physical and occupational therapy.  He has significant truncal and hemiataxia and requires 1 person assist to even stand and can walk only using the safety belt and with the therapists.  Spent most of his time in the wheelchair.  His voice is very hypophonic.  He recently saw Dr. Ezzard [name] ENT who did the fiberoptic endoscopy and did not find any significant abnormalities of the vocal cords.  He was complaining of significant fatigue and tiredness and Dr. Wynn [name] recently added Provigil 100 mg in the morning.  Patient did have some placement sleep problems which appear to be more exacerbated in the last 2 weeks since starting Provigil.  He does take  trazodone 50 mg at night but its not helping.  Patient has lot of questions about his timing and extent of recovery.  He is also read up on rehab trials as well as hyperbaric oxygen and has questions about using these.  He continues to have significant vertical diplopia and is wearing  an eye patch alternatingly on both eyes constantly.  He has seen Dr. Dione [name] who plans to see him back in July and may prescribe prisms if diplopia persists.  ROS:   14 system review of systems is positive for weakness, imbalance, walking difficulty, hoarseness of voice, double vision  PMH:  Past Medical History:  Diagnosis Date   Atrial flutter (HCC)    Dizzy 09/25/2013   Near syncope 09/25/2013   Stroke [facility])     Social History:  Social History   Socioeconomic History   Marital status: Married    Spouse name: Walter [name]   Number of children: Not on file   Years of education: Not on file   Highest education level: Not on file  Occupational History   Not on file  Tobacco Use   Smoking status: Never   Smokeless tobacco: Never  Substance and Sexual Activity   Alcohol use: Yes    Comment: occasionally   Drug use: No   Sexual activity: Not on file  Other Topics Concern   Not on file  Social History Narrative   Lives with wife at home   Right Handed   Drinks decaf   Social Determinants of Health   Financial Resource Strain: Not on file  Food Insecurity: Not on file  Transportation Needs: Not on file  Physical Activity: Not on file  Stress: Not on file  Social Connections: Not on file  Intimate Partner Violence: Not on file    Medications:   Current Outpatient Medications on File Prior to Visit  Medication Sig Dispense Refill   acetaminophen (TYLENOL) 325 MG tablet Take 2 tablets (650 mg total) by mouth every 4 (four) hours as needed for mild pain or fever.     apixaban (ELIQUIS) 5 MG TABS tablet Take 1 tablet (5 mg total) by mouth 2 (two) times daily. 60 tablet 0   Armodafinil 150 MG tablet Take 150 mg by mouth daily.     Armodafinil 150 MG tablet Take 1 tablet (150 mg total) by mouth daily. 30 tablet 0   cholecalciferol (VITAMIN D3) 25 MCG (1000 UNIT) tablet Take 1 tablet (1,000 Units total) by mouth daily. 30 tablet 0   clonazepam (KLONOPIN) 0.125 MG disintegrating  tablet Take 1 tablet (0.125 mg total) by mouth at bedtime as needed (sleep). 20 tablet 0   diltiazem (CARDIZEM LA) 120 MG 24 hr tablet Take 120 mg by mouth daily.     doxazosin (CARDURA) 2 MG tablet Take 1 tablet (2 mg total) by mouth daily. 30 tablet 0   modafinil (PROVIGIL) 100 MG tablet Take 1 tablet (100 mg total) by mouth daily. 30 tablet 1   sertraline (ZOLOFT) 50 MG tablet Take 50 mg by mouth daily.     traZODone (DESYREL) 50 MG tablet Take 0.5 tablets (25 mg total) by mouth at bedtime. (Patient taking differently: Take 50 mg by mouth at bedtime.) 30 tablet 0   No current facility-administered medications on file prior to visit.    Allergies:   Allergies  Allergen Reactions   Keppra [Levetiracetam] Swelling    Patient experienced angioedema post inpatient keppra dose   Latex Itching    Skin  turns real red    Physical Exam General: well developed, well nourished, seated, in no evident distress Head: head normocephalic and atraumatic.  Neck: supple with no carotid or supraclavicular bruits Cardiovascular: regular rate and rhythm, no murmurs Musculoskeletal: no deformity Skin:  no rash/petichiae Vascular:  Normal pulses all extremities Vitals:   12/14/20 0820  BP: 129/78  Pulse: 90   Neurologic Exam Mental Status: Awake and fully alert. Oriented to place and time. Recent and remote memory intact. Attention span, concentration and fund of knowledge appropriate. Mood and affect appropriate.  Speech is hypophonic but can be understood with some difficulty. Cranial Nerves: Fundoscopic exam reveals sharp disc margins. Pupils equal, briskly reactive to light. Extraocular movements show skew deviation with left eye hypotrophic and moving less in vertical direction compared to the right.  Patient has binocular diplopia which abolishes with closing either eye. Visual fields full to confrontation. Hearing intact. Facial sensation intact. Face, tongue, palate moves normally and  symmetrically.  Motor: Normal bulk and tone. Normal strength in all tested extremity muscles except mild weakness of left grip and intrinsic hand muscles.  Orbits right over left upper extremity.  Mild weakness of left hip flexors and ankle dorsiflexors. Sensory.: intact to touch ,pinprick .position and vibratory sensation.  Coordination: Marked left finger-to-nose and mild left knee to heel ataxia.. Gait and Station: Needs 1 person assist to stand but significant truncal and left body hemiataxia hence unable to walk Reflexes: 1+ and symmetric. Toes downgoing.   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  Prolonged hospitalization complicated by chronic atrial fibrillation, acute pulmonary embolism with respiratory failure, bilateral lower extremity DVT, tracheostomy, dysphagia poststroke and mild malnutrition     PLAN: I had a long discussion with patient and his wife regarding his recent brainstem hemorrhage of undetermined etiology and residual diplopia, dysphonia and ataxia.  I expect slow recovery with ongoing home therapies followed by subsequent outpatient therapies.  I recommend we repeat MRI scan of the brain with and without contrast to look for small structural vascular abnormality in the brainstem not seen on the initial MRI.  Continue follow-up with Dr. Dione [name] for management of his diplopia with prisms and with Dr. Wynn [name] for rehabilitation needs.  Patient and his wife had questions about possible participation in rehabilitation trials at [facility] and at [facility] as well as possible hyperal baric chamber but I recommend he first maximize and finish his home as well as outpatient therapies prior to considering that.  He will return for follow-up to see me in 3 months or call earlier if  necessary. Greater than 50% of time during this 40 minute prolonged visit was spent on counseling,explanation of diagnosis, planning of further management, discussion with patient and family and coordination of care Delia Heady, MD Note: This document was prepared with digital dictation and possible smart phrase technology. Any transcriptional errors that result from this process are unintentional

## 2020-12-14 NOTE — Patient Instructions (Signed)
I had a long discussion with patient and his wife regarding his recent brainstem hemorrhage of undetermined etiology and residual diplopia, dysphonia and ataxia.  I expect slow recovery with ongoing home therapies followed by subsequent outpatient therapies.  I recommend we repeat MRI scan of the brain with and without contrast to look for small structural vascular abnormality in the brainstem not seen on the initial MRI.  Continue follow-up with Dr. Dione Booze for management of his diplopia with prisms and with Dr. Wynn Banker for rehabilitation needs.  He will return for follow-up to see me in 3 months or call earlier if necessary. Ataxia  Ataxia is a condition that causes unsteadiness when walking and standing, poor coordination of body movements, and difficulty keeping a straight (upright) posture. A problem with the part of the brain that controls coordination and stability (cerebellum) can cause this condition. Ataxia can develop later in life (acquired ataxia), during your 20s or 30s, or into your 60s or later. This type of ataxia develops when another medical condition, such as a stroke, damages the cerebellum. Ataxia also may be present early in life (non-acquired ataxia). There are two main types of non-acquired ataxia: Congenital. This type is present at birth. Hereditary. This type is passed from parent to child. The most common form of hereditary non-acquired ataxia is Friedreich ataxia. What are the causes? Acquired ataxia may be caused by: Changes in the nervous system (neurodegenerative changes). Changes throughout the body (systemic disorders). A lot of exposure to: Certain medicines, such as phenytoin and lithium. Solvents. These are cleaning fluids, such as paint thinner, nail polish remover, carpet cleaner, and degreasers. Alcohol abuse. Medical conditions, such as: Celiac disease. Hypothyroidism. A lack (deficiency) of vitamin E, vitamin B12, or thiamine. Brain tumors. Multiple  sclerosis. Cerebral palsy. Stroke. Paraneoplastic syndromes. These are rare disorders triggered by the body's defense system (immune system) in response to a cancerous tumor. Viral infections. Head injury. Malnutrition. Congenital and hereditary ataxia are caused by problems that are present ingenes before birth. What are the signs or symptoms? Signs and symptoms of ataxia may vary, depending on the cause. They may include: Being unsteady. Walking with the legs wide apart to keep one's balance. Uncontrolled shaking (tremor). Poorly coordinated body movements. Difficulty maintaining an upright posture. Fatigue. Changes in speech, vision, or both. Involuntary eye movements. Difficulty swallowing. Difficulty writing. Muscle tightening that you cannot control (muscle spasms). How is this diagnosed? Ataxia may be diagnosed based on: Your personal and family medical history. A physical exam. Imaging tests, such as a CT scan or MRI. Spinal tap (lumbar puncture). This procedure involves using a needle to take a sample of the fluid around your brain and spinal cord. Genetic testing. Blood work. How is this treated? The underlying condition that causes ataxia needs to be treated. If the causeis a brain tumor, you may need surgery. Treatment also focuses on improving your quality of life. This may involve: Physical therapy. This helps you to learn ways to improve coordination and move around more carefully. Occupational therapy. This helps you to improve your ability to do daily tasks, such as bathing and feeding yourself. Using devices to help you move around, eat, or communicate. These are called assistive devices, and they include a walker, modified eating utensils, and communication aids. Speech therapy. This helps you to learn ways to improve speech and swallowing. Follow these instructions at home: Preventing falls Lie down right away if you become very unsteady, dizzy, or nauseous,  or if you feel  like you are going to faint. Do not get up until all of those feelings pass. Keep your home well-lit. Use night-lights as needed. Remove tripping hazards, such as throw rugs, cords, and clutter. Install grab bars by the toilet and in the tub and shower. Use assistive devices, such as a cane, walker, or wheelchair as needed to keep your balance. General instructions Do not drink alcohol. Ask your health care provider what activities are safe for you, and what activities you should avoid. Take over-the-counter and prescription medicines only as told by your health care provider. Contact a health care provider if: You have increasing unsteadiness or fall. You need equipment to help with walking, such as a cane or walker. You have difficulty with swallowing. Get help right away if: You have unsteadiness that suddenly worsens. You have any of these: Severe headaches. Chest pain. Abdominal pain. Weakness or numbness on one side of your body. Vision problems. Difficulty speaking. An irregular heartbeat. A very fast pulse. You feel confused. These symptoms may represent a serious problem that is an emergency. Do not wait to see if the symptoms will go away. Get medical help right away. Call your local emergency services (911 in the [place] Do not drive yourself to the hospital. Summary Ataxia is a condition that causes unsteadiness when walking and standing, poor coordination of body movements, and difficulty keeping a straight (upright) posture. Ataxia occurs because of a problem with the part of the brain that controls coordination and stability (cerebellum). The underlying condition that causes your ataxia needs to be treated. Treatment also focuses on improving your quality of life. This information is not intended to replace advice given to you by your health care provider. Make sure you discuss any questions you have with your healthcare provider. Document Revised:  06/11/2020 Document Reviewed: 06/11/2020 Elsevier Patient Education  2022 [employer].

## 2020-12-22 ENCOUNTER — Telehealth: Payer: Self-pay | Admitting: *Deleted

## 2020-12-22 MED ORDER — ARMODAFINIL 150 MG PO TABS: 150.0000 mg | ORAL_TABLET | Freq: Every day | ORAL | 0 refills | Status: DC

## 2020-12-22 NOTE — Telephone Encounter (Signed)
Notified Walter [name] that outpt neuro rehab orders placed. Mychart message sent as well.

## 2020-12-25 ENCOUNTER — Other Ambulatory Visit: Payer: Self-pay

## 2020-12-25 ENCOUNTER — Ambulatory Visit
Admission: RE | Admit: 2020-12-25 | Discharge: 2020-12-25 | Disposition: A | Discharge status: Home / Self Care | Payer: Medicare Other | Source: Ambulatory Visit | Attending: Neurology | Admitting: Neurology

## 2020-12-25 DIAGNOSIS — I613 Nontraumatic intracerebral hemorrhage in brain stem: Secondary | ICD-10-CM | POA: Diagnosis not present

## 2020-12-25 MED ORDER — GADOBENATE DIMEGLUMINE 529 MG/ML IV SOLN
18.0000 mL | Freq: Once | INTRAVENOUS | Status: AC | PRN
  Administered 2020-12-25: 18 mL via INTRAVENOUS

## 2020-12-26 ENCOUNTER — Ambulatory Visit: Payer: Medicare Other

## 2020-12-26 ENCOUNTER — Encounter: Payer: Self-pay | Admitting: Occupational Therapy

## 2020-12-26 ENCOUNTER — Ambulatory Visit: Payer: Medicare Other | Attending: Physical Medicine & Rehabilitation | Admitting: Occupational Therapy

## 2020-12-26 DIAGNOSIS — R2689 Other abnormalities of gait and mobility: Secondary | ICD-10-CM

## 2020-12-26 DIAGNOSIS — I639 Cerebral infarction, unspecified: Secondary | ICD-10-CM | POA: Insufficient documentation

## 2020-12-26 DIAGNOSIS — R278 Other lack of coordination: Secondary | ICD-10-CM | POA: Diagnosis present

## 2020-12-26 DIAGNOSIS — R260 Ataxic gait: Secondary | ICD-10-CM | POA: Insufficient documentation

## 2020-12-26 DIAGNOSIS — R41842 Visuospatial deficit: Secondary | ICD-10-CM | POA: Diagnosis present

## 2020-12-26 DIAGNOSIS — R471 Dysarthria and anarthria: Secondary | ICD-10-CM | POA: Insufficient documentation

## 2020-12-26 DIAGNOSIS — R2681 Unsteadiness on feet: Secondary | ICD-10-CM | POA: Diagnosis present

## 2020-12-26 DIAGNOSIS — I69218 Other symptoms and signs involving cognitive functions following other nontraumatic intracranial hemorrhage: Secondary | ICD-10-CM | POA: Diagnosis present

## 2020-12-26 DIAGNOSIS — R41841 Cognitive communication deficit: Secondary | ICD-10-CM

## 2020-12-26 DIAGNOSIS — M6281 Muscle weakness (generalized): Secondary | ICD-10-CM

## 2020-12-26 DIAGNOSIS — R270 Ataxia, unspecified: Secondary | ICD-10-CM | POA: Insufficient documentation

## 2020-12-26 DIAGNOSIS — R41844 Frontal lobe and executive function deficit: Secondary | ICD-10-CM | POA: Insufficient documentation

## 2020-12-26 DIAGNOSIS — R208 Other disturbances of skin sensation: Secondary | ICD-10-CM
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
## 2020-12-26 NOTE — Addendum Note (Signed)
Addended by: Willa Frater D on: 12/26/2020 06:23 PM   Modules accepted: Orders

## 2020-12-26 NOTE — Patient Instructions (Signed)
  ABDOMINAL BREATHING   Shoulders down - this is a cue to relax Place your hand on your abdomen - this helps you focus on easy abdominal breath support - the best and most relaxed way to breathe Breathe in through your nose and fill your belly with air, watching your hand move outward Breathe out through your mouth and watch your belly move in. An audible "sh"  may help   Think of your belly as a balloon, when you fill with air (inhale), the balloon gets bigger. As the air goes out (exhale), the balloon deflates.  If you are having difficulty coordinating this, lay on your back with a plastic cup on your belly and repeat the above steps, watching you belly move up with inhalation and down with exhalations  Practice breathing in and out in front of a mirror, watching your belly Breathe in for a count of 5 and breathe out for a count of 5  Now as you breathe out, get a picture of relaxing in your mind Feel the constant in-out of your breathing with your belly Picture the tension in your throat and chest evaporate like steam, melting away and FEEL it do so Picture your throat opening up so wide that a grapefruit or softball could fit through your throat.   Practice this throughout the day when you are not having symptoms. For example: in the car, when watching TV, before medications. Regular practice when you are feeling well is important.  Make it automatic and use it at the first sense of throat tightness to prevent or suppress the VCD. You may start with the inhale or exhale.   What to Do Instead of Clearing Your Throat Use these strategies instead of clearing your throat. 1. Swallow your saliva. Swallow as hard as you can.  2. Take a drink of water. 3. Suck on ice chips. 4. Use a silent cough. Whisper the word "huh" from your belly without making a sound and then swallow. This is like a cough but without using your voice. 5. Hum on an "M" and then swallow. 6. Use a light, gentle  cough (like tapping your vocal folds together) and then swallow. 7. Silently count to 10 and then swallow

## 2020-12-26 NOTE — Therapy (Signed)
OUTPATIENT PHYSICAL THERAPY NEURO EVALUATION   Patient Name: Walter [name] MRN: [id number] DOB:Sep 10, 1955, 65 y.o., male Today's Date: 12/26/2020  PCP: Clovis Riley, L.August Saucer, MD REFERRING PROVIDER: Erick Colace, MD    Past Medical History:  Diagnosis Date   Atrial flutter [facility])    Dizzy 09/25/2013   Near syncope 09/25/2013   Stroke [facility])    Past Surgical History:  Procedure Laterality Date   A-FLUTTER ABLATION N/A 03/09/2020   Procedure: A-FLUTTER ABLATION;  Surgeon: Hillis Range, MD;  Location: [facility] INVASIVE CV LAB;  Service: Cardiovascular;  Laterality: N/A;   CARDIOVERSION N/A 09/27/2013   Procedure: CARDIOVERSION;  Surgeon: Lewayne Bunting, MD;  Location: [facility] ENDOSCOPY;  Service: Cardiovascular;  Laterality: N/A;   IR ANGIO EXTERNAL CAROTID SEL EXT CAROTID UNI R MOD SED  09/26/2020   IR ANGIO INTRA EXTRACRAN SEL COM CAROTID INNOMINATE BILAT MOD SED  09/26/2020   IR ANGIO VERTEBRAL SEL VERTEBRAL UNI R MOD SED  09/26/2020   IR IVC FILTER PLMT / S&I /IMG GUID/MOD SED  10/08/2020   IR IVUS EACH ADDITIONAL NON CORONARY VESSEL  11/02/2020   IR PTA VENOUS EXCEPT DIALYSIS CIRCUIT  11/02/2020   IR THROMBECT VENO MECH MOD SED  10/27/2020   IR THROMBECT VENO MECH MOD SED  11/02/2020   IR US GUIDE VASC ACCESS LEFT  10/27/2020   IR US GUIDE VASC ACCESS RIGHT  09/26/2020   IR US GUIDE VASC ACCESS RIGHT  11/02/2020   IR VENO/EXT/UNI RIGHT  11/02/2020   IR VENOCAVAGRAM IVC  10/27/2020   RETINAL DETACHMENT SURGERY     right knee arthroscopy     TEE WITHOUT CARDIOVERSION N/A 09/27/2013   Procedure: TRANSESOPHAGEAL ECHOCARDIOGRAM (TEE);  Surgeon: Lewayne Bunting, MD;  Location: [facility] ENDOSCOPY;  Service: Cardiovascular;  Laterality: N/A;   WRIST SURGERY     Patient Active Problem List   Diagnosis Date Noted   Thrombosis    Sleep disturbance    Dysphagia, post-stroke    PAF (paroxysmal atrial fibrillation) (HCC)    Acute pulmonary embolism without acute cor pulmonale (HCC)    Malnutrition of moderate  degree 11/04/2020   Tracheostomy care (HCC)    Pressure injury of skin 10/23/2020   Intracranial hemorrhage (HCC)    Atrial fibrillation (HCC)    Prediabetes    Leukocytosis    Acute blood loss anemia    Hypernatremia    ICH (intracerebral hemorrhage) (HCC) 09/25/2020   Unilateral primary osteoarthritis, left knee 09/26/2016   Atrial flutter (HCC) 09/25/2013   Near syncope 09/25/2013    ONSET DATE: 09/25/20  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  SUBJECTIVE:   SUBJECTIVE STATEMENT: Pt was having ear pain at 2 am in the morning. They called EMS and took him to the hospital. Pt was found to have stroke and bleeding in brain. He had shunt put in his brain for about 3 weeks which has been removed. He came home from inpatient rehab on 11/23/20. He had home therapy until last week. He was receiving PT, OT, and SLP. They were using walker to walk with him at home.   He has 24/7 caregiver at home right now.  PERTINENT HISTORY: CVA/hemorrage 08/2020, has 24/7 caregiver at home  PAIN:  Are you having pain? No  PRECAUTIONS: Fall  WEIGHT BEARING RESTRICTIONS No  FALLS: Has patient fallen in last 6 months? No, Number of falls: 0  LIVING ENVIRONMENT: Lives with: lives with their spouse and 24/7 caregiver Lives in: House/apartment Stairs: Yes; Internal: 12 steps; Rail on bil going up and External: 8 steps; Rail on bil (but only can reach one at a time) going up Has following equipment at home: Dan Humphreys - 2 wheeled and Shower bench  PLOF: Independent  PATIENT GOALS improve independence.  OBJECTIVE:   DIAGNOSTIC FINDINGS: IMPRESSION: 1.  Decreased, improved ventricle size since 10/06/2020 with stable small volume of IVH. 2. Hematoma and edema along the right superior approach EVD tract is stable, including a small volume of bilateral SAH. 3. Expected evolution of the left midbrain/thalamic hemorrhage and edema. 4. Small additional scattered subacute infarcts remain largely occult by CT. 5. No new intracranial abnormality identified.    IMPRESSION: 1. Similar appearance of intraventricular hemorrhage as well as hemorrhage along prior EVD tract in the right frontal lobe and scattered cortical subarachnoid hemorrhage. 2. Ventricular size remains stable. 3. No significant change in size of intraparenchymal hemorrhage centered within the left side of the midbrain extending into the thalamic pulvinar and superior cerebellar peduncle. 4. A few small foci of restricted diffusion within the left frontal and parietal lobes, corresponding to small acute/subacute infarcts  COGNITION: Overall cognitive status: Within functional limits for tasks assessed   SENSATION: Light touch: Deficits R UE, R LE  MMT:  MMT Right 12/26/2020 Left 12/26/2020  Hip flexion 5/5 5/5  Hip abduction 5/5 5/5  Hip adduction 5/5 5/5  Knee flexion 5/5 5/5  Knee extension 5/5 5/5  Ankle dorsiflexion 5/5 5/5  (Blank rows = not tested)   TRANSFERS: Assistive device utilized: Environmental consultant - 2 wheeled  Sit to stand: Min A Stand to sit: Min A Chair to chair: Min A    GAIT: Gait pattern: knee flexed in stance- Right, knee flexed in stance- Left, scissoring, ataxic, lateral lean- Right, lateral lean- Left, decreased trunk rotation, narrow BOS, poor foot clearance- Right, and poor foot clearance- Left Distance walked: 145' Assistive device utilized: Environmental consultant - 2 wheeled Level of assistance: Min A and Mod A Comments: pt takes excessive step with L LE, cues for smaller L step and keeping wider BOS to prevent from criss crossing during swing phase, pt has  excessive posteriro lean due to excessive L step length, pt tilts the walker depending on side of his LOB and requires min to mod A  FUNCTIONAL TESTs: not performed    TODAY'S TREATMENT:  Gait training: 1 x 145' with RW and min to mod A (see gait section above for cueing) Sit to stand: 10x with use of bil arms, cues for "nose over toes", standing up and maintaining standing balance for 5 sec before sitting back down. Pt requires cues for holding chair with one hand and walker with second hand with sit to stand during transfers.   PATIENT EDUCATION: Education details: sit to stand instructions for safety Person educated: Patient and Spouse Education method: Explanation, Demonstration, and Verbal cues Education comprehension: verbalized understanding   HOME EXERCISE PROGRAM: Sit to stand  ASSESSMENT:  CLINICAL IMPRESSION: Patient is a 65 y.o. male who was seen today for physical therapy evaluation and treatment for gait and mobility disorder after recent stroke. Objective impairments include Abnormal gait, decreased activity tolerance, decreased balance, decreased  coordination, decreased endurance, decreased knowledge of condition, decreased mobility, difficulty walking, decreased safety awareness, impaired sensation, impaired tone, impaired UE functional use, impaired vision/preception, improper body mechanics, and postural dysfunction. These impairments are limiting patient from cleaning, community activity, driving, meal prep, occupation, laundry, medication management, yard work, shopping, personal finances, yard work, and church. Personal factors including Past/current experiences, Time since onset of injury/illness/exacerbation, and 1 comorbidity: A. flutter  are also affecting patient's functional outcome. Patient will benefit from skilled PT to address above impairments and improve overall function.  REHAB POTENTIAL: Good  CLINICAL DECISION MAKING: Stable/uncomplicated  EVALUATION  COMPLEXITY: Low   GOALS: Goals reviewed with patient? Yes  SHORT TERM GOALS:  STG Name Target Date Goal status  1 Patient will be able to perform chair to chair transfer with CGA/SBA with use of RW for safety to improve independence. Baseline: min to mod A (12/26/20) 01/23/2021 INITIAL  2 Pt will be able to ambulate 115' with RW with CGA to min A with RW for safety to improve independence Baseline: min to mod A with RW 145' 01/23/2021 INITIAL  3 Pt will be able to maintain standing balance for 30 sec to improve static balance Baseline: 10 sec before needing help due to LOB (12/26/20) 01/23/2021 INITIAL   LONG TERM GOALS:   LTG Name Target Date Goal status  1 Pt will demo >5 points improvement on Berg Balance Scale to improve static balance Baseline: TBD 02/20/2021 INITIAL  2 Pt will be able to ambulate >500 feet with RW and SBA to improve walking endurance and safety. Baseline: 145' with RW min to mod A 02/20/2021 INITIAL  3 Pt will be able to perform gait speed improvement by 0.60m/s to improve community ambulation. Baseline:TBD 02/20/2021 INITIAL  4 Pt will demo 5 sec improvement in 5x sit to stand (with or without UE support) to improve functional strength Baseline: TBD 02/20/2021 INITIAL   PLAN: PT FREQUENCY: 2x/week  PT DURATION: 8 weeks  PLANNED INTERVENTIONS: Therapeutic exercises, Therapeutic activity, Neuro Muscular re-education, Balance training, Gait training, Patient/Family education, Joint mobilization, Stair training, Visual/preceptual remediation/compensation, Wheelchair mobility training, Cryotherapy, Moist heat, and Manual therapy  PLAN FOR NEXT SESSION: Assess 5x sit to stand with UE use, gait speed, BBS   [name], PT 12/26/2020, 11:53 AM  [facility] [address] [place], [place], [address] Phone: [phone]   Fax:  [phone]

## 2020-12-27 ENCOUNTER — Other Ambulatory Visit: Payer: Self-pay

## 2020-12-27 ENCOUNTER — Telehealth (HOSPITAL_COMMUNITY): Payer: Self-pay | Admitting: Radiology

## 2020-12-27 ENCOUNTER — Telehealth: Payer: Self-pay | Admitting: Neurology

## 2020-12-27 ENCOUNTER — Other Ambulatory Visit: Payer: Self-pay | Admitting: Neurology

## 2020-12-27 ENCOUNTER — Ambulatory Visit (INDEPENDENT_AMBULATORY_CARE_PROVIDER_SITE_OTHER): Payer: Medicare Other | Admitting: Internal Medicine

## 2020-12-27 ENCOUNTER — Encounter: Payer: Self-pay | Admitting: Internal Medicine

## 2020-12-27 VITALS — BP 132/72 | HR 104 | Ht 72.0 in | Wt 179.4 lb

## 2020-12-27 DIAGNOSIS — I480 Paroxysmal atrial fibrillation: Secondary | ICD-10-CM

## 2020-12-27 DIAGNOSIS — I639 Cerebral infarction, unspecified: Secondary | ICD-10-CM

## 2020-12-27 DIAGNOSIS — I483 Typical atrial flutter: Secondary | ICD-10-CM | POA: Diagnosis not present

## 2020-12-27 DIAGNOSIS — Q2730 Arteriovenous malformation, site unspecified: Secondary | ICD-10-CM

## 2020-12-27 MED ORDER — DILTIAZEM HCL ER COATED BEADS 180 MG PO CP24: 180.0000 mg | ORAL_CAPSULE | Freq: Every day | ORAL | 3 refills | Status: DC

## 2020-12-27 NOTE — Telephone Encounter (Signed)
Wife(on DPR) is asking for a call back from Dr Pearlean Brownie or his RN re: the pt's results he called to discuss.

## 2020-12-27 NOTE — Telephone Encounter (Signed)
Called pt to schedule cerebral angio with Dr. Quay Burow, left VM for him to call back to schedule. JM

## 2020-12-27 NOTE — Patient Instructions (Addendum)
Medication Instructions:  Increase Diltiazem to 180 mg daily Your physician recommends that you continue on your current medications as directed. Please refer to the Current Medication list given to you today.  Labwork: None ordered.  Testing/Procedures: None ordered.  Follow-Up: Your physician wants you to follow-up in: 04/02/21 at 9:45 am with Hillis Range, MD    Any Other Special Instructions Will Be Listed Below (If Applicable).  If you need a refill on your cardiac medications before your next appointment, please call your pharmacy.

## 2020-12-27 NOTE — Telephone Encounter (Signed)
Patient is calling Dr. Pearlean Brownie back regarding MRI results.

## 2020-12-27 NOTE — Therapy (Signed)
[facility] [address] [place], [place], [address] Phone: [phone]   Fax:  [phone]  Speech Language Pathology Evaluation  Patient Details  Name: Walter [name] MRN: [id number] Date of Birth: 1955/07/18 Referring Provider (SLP): Erick Colace, MD   Encounter Date: 12/26/2020   End of Session - 12/26/20 1442     Visit Number 1    Number of Visits 17    Date for SLP Re-Evaluation 02/21/21    Authorization Type Medicare    SLP Start Time 1324    SLP Stop Time  1400    SLP Time Calculation (min) 36 min    Activity Tolerance Patient tolerated treatment well             Past Medical History:  Diagnosis Date   Atrial flutter (HCC)    Dizzy 09/25/2013   Near syncope 09/25/2013   Stroke [facility])     Past Surgical History:  Procedure Laterality Date   A-FLUTTER ABLATION N/A 03/09/2020   Procedure: A-FLUTTER ABLATION;  Surgeon: Hillis Range, MD;  Location: [facility] INVASIVE CV LAB;  Service: Cardiovascular;  Laterality: N/A;   CARDIOVERSION N/A 09/27/2013   Procedure: CARDIOVERSION;  Surgeon: Lewayne Bunting, MD;  Location: [facility] ENDOSCOPY;  Service: Cardiovascular;  Laterality: N/A;   IR ANGIO EXTERNAL CAROTID SEL EXT CAROTID UNI R MOD SED  09/26/2020   IR ANGIO INTRA EXTRACRAN SEL COM CAROTID INNOMINATE BILAT MOD SED  09/26/2020   IR ANGIO VERTEBRAL SEL VERTEBRAL UNI R MOD SED  09/26/2020   IR IVC FILTER PLMT / S&I /IMG GUID/MOD SED  10/08/2020   IR IVUS EACH ADDITIONAL NON CORONARY VESSEL  11/02/2020   IR PTA VENOUS EXCEPT DIALYSIS CIRCUIT  11/02/2020   IR THROMBECT VENO MECH MOD SED  10/27/2020   IR THROMBECT VENO MECH MOD SED  11/02/2020   IR US GUIDE VASC ACCESS LEFT  10/27/2020   IR US GUIDE VASC ACCESS RIGHT  09/26/2020   IR US GUIDE VASC ACCESS RIGHT  11/02/2020   IR VENO/EXT/UNI RIGHT  11/02/2020   IR VENOCAVAGRAM IVC  10/27/2020   RETINAL DETACHMENT SURGERY     right knee arthroscopy     TEE WITHOUT CARDIOVERSION  N/A 09/27/2013   Procedure: TRANSESOPHAGEAL ECHOCARDIOGRAM (TEE);  Surgeon: Lewayne Bunting, MD;  Location: [facility] ENDOSCOPY;  Service: Cardiovascular;  Laterality: N/A;   WRIST SURGERY      There were no vitals filed for this visit.       SLP Evaluation [facility] - 12/26/20 1313       SLP Visit Information   SLP Received On 12/22/20    Referring Provider (SLP) Erick Colace, MD    Onset Date 09-25-20    Medical Diagnosis 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      Subjective   Patient/Family Stated Goal "to be able to be understood"      Pain Assessment   Currently in Pain? 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  Prolonged hospitalization complicated by chronic atrial fibrillation, acute pulmonary embolism with respiratory failure, bilateral lower extremity DVT, tracheostomy, dysphagia poststroke and mild malnutrition    Behavioral/Cognition impulsive    Mobility Status wheelchair - on PT/OT      Balance Screen   Has the patient fallen in the past 6 months No  Prior Functional Status   Cognitive/Linguistic Baseline Within functional limits    Type of Home House     Lives With Spouse    Available Support Home health    Education MBA    Vocation Other (Comment)   commerical real estate     Cognition   Overall Cognitive Status Impaired/Different from baseline    Area of Impairment Safety/judgement;Attention;Awareness;Memory    Current Attention Level Sustained    Attention Comments usual deviations in attention requiring SLP clarification and redirection to conversation    Memory Decreased short-term memory    Memory Comments requires usual repetition of information    Safety/Judgement Decreased awareness of safety    Safety and Judgement Comments  impulsive, ataxic    Awareness Emergent;Anticipatory    Behaviors Impulsive      Auditory Comprehension   Overall Auditory Comprehension Appears within functional limits for tasks assessed      Reading Comprehension   Reading Status Not tested      Expression   Primary Mode of Expression Verbal      Verbal Expression   Overall Verbal Expression Appears within functional limits for tasks assessed      Written Expression   Dominant Hand Right    Written Expression Not tested      Oral Motor/Sensory Function   Overall Oral Motor/Sensory Function Impaired    Labial Symmetry Within Functional Limits    Labial Coordination WFL    Lingual ROM Within Functional Limits    Lingual Symmetry Within Functional Limits    Lingual Strength Reduced    Lingual Sensation Within Functional Limits    Lingual Coordination Reduced    Facial ROM Within Functional Limits    Facial Symmetry Within Functional Limits      Motor Speech   Overall Motor Speech Impaired    Respiration Impaired    Level of Impairment Sentence    Phonation Normal    Resonance Within functional limits    Articulation Impaired    Level of Impairment Sentence    Intelligibility Intelligibility reduced    Word 75-100% accurate    Phrase 50-74% accurate    Sentence 50-74% accurate    Conversation 50-74% accurate    Motor Planning Witnin functional limits    Effective Techniques Slow rate;Increased vocal intensity;Over-articulate;Pause;Pacing                             SLP Education - 12/26/20 1443     Education Details eval results, possible goals, throat clear alternatives    Person(s) Educated Patient;Spouse    Methods Explanation;Demonstration;Handout    Comprehension Verbalized understanding;Returned demonstration;Need further instruction              SLP Short Term Goals - 12/26/20 2001       SLP SHORT TERM GOAL #1   Title Pt will complete dysarthria HEP with occasional min A over  2 sessions    Time 4    Period Weeks    Status New      SLP SHORT TERM GOAL #2   Title Pt will produce loud /a/ or "hey!" with at least low 90s dB average over 2 sessions    Time 4    Period Weeks    Status New      SLP SHORT TERM GOAL #3   Title Pt will generate abdominal breathing in 16/20 answers to SLP questions over 2 sessions    Time 4  Period Weeks    Status New      SLP SHORT TERM GOAL #4   Title Pt will demonstrate dysarthria compensations in 10 minute simple to mod complex conversation to be 75% intelligibile with occasional min A over 2 sessions    Time 4    Period Weeks    Status New      SLP SHORT TERM GOAL #5   Title Pt will complete formal cognitive linguistic assessment in first 1-2 ST sessions    Time 2    Period Weeks    Status New              SLP Long Term Goals - 12/26/20 2005       SLP LONG TERM GOAL #1   Title Pt will complete dysarthria HEP with rare min A over 2 sessions    Time 8    Period Weeks    Status New      SLP LONG TERM GOAL #2   Title Pt will demonstrate dysarthria compensations in 15+ minute simple to mod complex conversation to be 90% intelligibile with rare min A over 2 sessions    Time 8    Period Weeks    Status New      SLP LONG TERM GOAL #3   Title Pt will demo improved sustained and selective attention during therapeutic tasks with less than 3 cues for active listening during 2 sessions    Time 8    Period Weeks    Status New      SLP LONG TERM GOAL #4   Title Pt will demonstrate emergent and anticipatory awareness of impulsivity with less than 5 cues to modify behaviors during 2 sessions    Time 8    Period Weeks    Status New      SLP LONG TERM GOAL #5   Title Pt will report reduced frustration and improved communication effectiveness via PROM by 2 points at last ST sessions    Baseline CES: 15.5 & V-RQOL:35    Time 8    Period Weeks    Status New              Plan - 12/26/20 1446     Clinical  Impression Statement "Walter [name]" presents for OPST evaluation secondary to stroke in March 2022. Pt was accompanied by his wife, [name]. Primary concern for speech therapy is to be understood. Reduced breath support and articulatory precision demonstrated during speech evaluation and in conversation. Pt rated ~60% intelligible in conversation in quiet room. ENT recently completed fiberoptic laryngoscopy, with hypophonic voice indicated given normal vocal cord mobility and minimal edema reported. Communication PROMs completed (Communication Effectiveness Survey & Voice Related Quality of Life), which revealed increased difficulty communicating with unfamiliar listeners, talking on telephone, conversing in noisy environments, and frequently having to repeat self. [name] reported fluctuations in speech during day, particularly when fatigued. Pt and his wife denied any residual swallowing deficits, as pt discharged on regular and thin liquids from hospital. Frequent throat clearing endorsed with hx of acid reflux reported (not currently on PPI). Some confusion and impulsivity reported by wife, for example completing phone calls at inappropriate times of day. Difficulty with sustained attention also indicated and exhibited, with intermittent SLP redirection and clarification needed. Formal cognitive linguistic evaluation recommended in first few ST sessions. Pt is not currently reading or writing since stroke due to double vision. 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 Skilled ST is warranted to address dysarthria and cognition to maximize communication effectiveness and optimize functional independence and safety at home.    Speech Therapy Frequency 2x / week    Duration 8 weeks    Treatment/Interventions Cueing hierarchy;Functional tasks;Patient/family education;Cognitive reorganization;Multimodal communcation  approach;Language facilitation;Compensatory techniques;Internal/external aids;SLP instruction and feedback    Potential to Achieve Goals Fair    Consulted and Agree with Plan of Care Patient;Family member/caregiver    Family Member Consulted wife, [name]             Patient will benefit from skilled therapeutic intervention in order to improve the following deficits and impairments:   Dysarthria and anarthria  Cognitive communication deficit    Problem List Patient Active Problem List   Diagnosis Date Noted   Thrombosis    Sleep disturbance    Dysphagia, post-stroke    PAF (paroxysmal atrial fibrillation) (HCC)    Acute pulmonary embolism without acute cor pulmonale (HCC)    Malnutrition of moderate degree 11/04/2020   Tracheostomy care (HCC)    Pressure injury of skin 10/23/2020   Intracranial hemorrhage (HCC)    Atrial fibrillation (HCC)    Prediabetes    Leukocytosis    Acute blood loss anemia    Hypernatremia    ICH (intracerebral hemorrhage) (HCC) 09/25/2020   Unilateral primary osteoarthritis, left knee 09/26/2016   Atrial flutter (HCC) 09/25/2013   Near syncope 09/25/2013    Janann Colonel, [name] 12/27/2020, 8:50 AM  [facility] [address] [place], [place], [address] Phone: [phone]   Fax:  [phone]  Name: Walter [name] MRN: [id number] Date of Birth: Jun 11, 1956

## 2020-12-27 NOTE — Progress Notes (Signed)
PCP: Clovis Riley, L.August Saucer, MD   Primary EP: Dr Johney Frame  Walter [name] is a 65 y.o. male who presents today for cardiology followup.  He is s/p prior hemorrhagic stroke.  His hospitalization was complicated by AF with embolic stroke as well as DVTs.  He had prolonged intubation.  He has made remarkable recovery but continues struggle daily with sequelae from his stroke.  He is being followed by Dr Pearlean Brownie.  He is participating in rehab.  Thought he has had occasional palpitations, his atrial arrhythmias are currently controlled.  He is tolerating eliquis without difficulty. Today, he denies symptoms of chest pain, shortness of breath, dizziness, presyncope, or syncope.  He has some dependant edema in his hands.  He also had ongoing issues with muscle weakness and myalgias/ neuropathic pain.     Past Medical History:  Diagnosis Date   Atrial flutter [facility])    s/p ablation   DVT (deep venous thrombosis) (HCC)    Near syncope 09/25/2013   Paroxysmal atrial fibrillation (HCC)    Stroke [facility])    initially hemorragic (not on OAC) followed by subsequent embolic stroke   Past Surgical History:  Procedure Laterality Date   A-FLUTTER ABLATION N/A 03/09/2020   Procedure: A-FLUTTER ABLATION;  Surgeon: Hillis Range, MD;  [facility] INVAS[facility] CV LAB;  Service: Cardiovascular;  Laterality: N/A;   CARDIOVERSION N/A 09/27/2013   Procedure: CARDIOVERSION;  Surgeon: Lewayne Bunting, MD;  Location: [facility] ENDOSCOPY;  Service: Cardiovascular;  Laterality: N/A;   IR ANGIO EXTERNAL CAROTID SEL EXT CAROTID UNI R MOD SED  09/26/2020   IR ANGIO INTRA EXTRACRAN SEL COM CAROTID INNOMINATE BILAT MOD SED  09/26/2020   IR ANGIO VERTEBRAL SEL VERTEBRAL UNI R MOD SED  09/26/2020   IR IVC FILTER PLMT / S&I /IMG GUID/MOD SED  10/08/2020   IR IVUS EACH ADDITIONAL NON CORONARY VESSEL  11/02/2020   IR PTA VENOUS EXCEPT DIALYSIS CIRCUIT  11/02/2020   IR THROMBECT VENO MECH MOD SED  10/27/2020   IR THROMBECT VENO MECH MOD SED  11/02/2020    IR US GUIDE VASC ACCESS LEFT  10/27/2020   IR US GUIDE VASC ACCESS RIGHT  09/26/2020   IR US GUIDE VASC ACCESS RIGHT  11/02/2020   IR VENO/EXT/UNI RIGHT  11/02/2020   IR VENOCAVAGRAM IVC  10/27/2020   RETINAL DETACHMENT SURGERY     right knee arthroscopy     TEE WITHOUT CARDIOVERSION N/A 09/27/2013   Procedure: TRANSESOPHAGEAL ECHOCARDIOGRAM (TEE);  Surgeon: Lewayne Bunting, MD;  Location: [facility] ENDOSCOPY;  Service: Cardiovascular;  Laterality: N/A;   WRIST SURGERY      ROS- all systems are reviewed and negatives except as per HPI above  Current Outpatient Medications  Medication Sig Dispense Refill   acetaminophen (TYLENOL) 325 MG tablet Take 2 tablets (650 mg total) by mouth every 4 (four) hours as needed for mild pain or fever.     apixaban (ELIQUIS) 5 MG TABS tablet Take 1 tablet (5 mg total) by mouth 2 (two) times daily. 60 tablet 0   Armodafinil 150 MG tablet Take 1 tablet (150 mg total) by mouth daily. 30 tablet 0   cholecalciferol (VITAMIN D3) 25 MCG (1000 UNIT) tablet Take 1 tablet (1,000 Units total) by mouth daily. 30 tablet 0   clonazepam (KLONOPIN) 0.125 MG disintegrating tablet Take 1 tablet (0.125 mg total) by mouth at bedtime as needed (sleep). 20 tablet 0   diltiazem (CARDIZEM CD) 180 MG 24 hr capsule Take 1 capsule (180  mg total) by mouth daily. 90 capsule 3   doxazosin (CARDURA) 4 MG tablet Take 4 mg by mouth daily.     sertraline (ZOLOFT) 50 MG tablet Take 50 mg by mouth daily.     traZODone (DESYREL) 50 MG tablet Take 2 tablets (100 mg total) by mouth at bedtime. 30 tablet 0   No current facility-administered medications for this visit.    Physical Exam: Vitals:   12/27/20 0926  BP: 132/72  Pulse: (!) 104  SpO2: 91%  Weight: 179 lb 6.4 oz (81.4 kg)  Height: 6' (1.829 m)    GEN- The patient is chronically ill appearing, in a wheelchair today,  alert and oriented x 3 today.   Head- normocephalic, atraumatic Eyes-  Sclera clear, conjunctiva pink Ears- hearing  intact Oropharynx- clear Lungs- Clear to ausculation bilaterally, normal work of breathing Heart- Regular rate and rhythm, no murmurs, rubs or gallops, PMI not laterally displaced GI- soft, NT, ND, + BS Extremities- no clubbing, cyanosis, + minor edema in hands, none in legs  Wt Readings from Last 3 Encounters:  12/27/20 179 lb 6.4 oz (81.4 kg)  12/14/20 190 lb (86.2 kg)  11/30/20 200 lb (90.7 kg)    EKG tracing ordered today is personally reviewed and shows sinus rhythm  Assessment and Plan:  Paroxysmal atrial fibrillation Currently in sinus rhythm He is now on eliquis for stroke prevention and tolerating this well.  He did have prior hemorrhagic stroke but has been cleared by neurology to now take [facility] therapy. Increase diltiazem to 180mg  daily  2. Atrial flutter S/p CTI ablation  3. HTN Elevated Increase diltiazem to 180mg  daily today  4. Prior stroke/ DVTs Follows with rehab and Dr Continue long term [facility] therapy  Risks, benefits and potential toxicities for medications prescribed and/or refilled reviewed with patient today.   Return to see me in 3 months  Pearlean Brownie MD, [facility] 12/27/2020 1:15 PM

## 2020-12-28 ENCOUNTER — Other Ambulatory Visit: Payer: Self-pay

## 2020-12-28 ENCOUNTER — Other Ambulatory Visit: Payer: Self-pay | Admitting: Interventional Radiology

## 2020-12-28 DIAGNOSIS — I8290 Acute embolism and thrombosis of unspecified vein: Secondary | ICD-10-CM

## 2020-12-28 DIAGNOSIS — Z95828 Presence of other vascular implants and grafts: Secondary | ICD-10-CM

## 2021-01-02 ENCOUNTER — Other Ambulatory Visit: Payer: Self-pay | Admitting: Neurology

## 2021-01-03 ENCOUNTER — Ambulatory Visit: Payer: Medicare Other

## 2021-01-03 ENCOUNTER — Other Ambulatory Visit: Payer: Self-pay

## 2021-01-03 ENCOUNTER — Ambulatory Visit: Payer: Medicare Other | Attending: Physical Medicine & Rehabilitation

## 2021-01-03 DIAGNOSIS — R260 Ataxic gait: Secondary | ICD-10-CM | POA: Diagnosis present

## 2021-01-03 DIAGNOSIS — R41844 Frontal lobe and executive function deficit: Secondary | ICD-10-CM | POA: Diagnosis present

## 2021-01-03 DIAGNOSIS — R208 Other disturbances of skin sensation: Secondary | ICD-10-CM | POA: Insufficient documentation

## 2021-01-03 DIAGNOSIS — R2681 Unsteadiness on feet: Secondary | ICD-10-CM | POA: Diagnosis present

## 2021-01-03 DIAGNOSIS — R270 Ataxia, unspecified: Secondary | ICD-10-CM | POA: Insufficient documentation

## 2021-01-03 DIAGNOSIS — R278 Other lack of coordination: Secondary | ICD-10-CM

## 2021-01-03 DIAGNOSIS — R471 Dysarthria and anarthria: Secondary | ICD-10-CM | POA: Insufficient documentation

## 2021-01-03 DIAGNOSIS — R41841 Cognitive communication deficit: Secondary | ICD-10-CM | POA: Diagnosis present

## 2021-01-03 DIAGNOSIS — I69218 Other symptoms and signs involving cognitive functions following other nontraumatic intracranial hemorrhage: Secondary | ICD-10-CM | POA: Insufficient documentation

## 2021-01-03 DIAGNOSIS — R41842 Visuospatial deficit: Secondary | ICD-10-CM | POA: Diagnosis present

## 2021-01-03 DIAGNOSIS — M6281 Muscle weakness (generalized): Secondary | ICD-10-CM | POA: Insufficient documentation

## 2021-01-03 NOTE — Patient Instructions (Signed)
Breath in through your nose, breath out through you mouth  Stay relaxed (your upper body should not move)  Say "ah" as loud and long as you can x10 times (do not strain, stay relaxed)  Practice "belly breathing" for 5 mins 5x a day

## 2021-01-03 NOTE — Therapy (Signed)
OUTPATIENT PHYSICAL THERAPY TREATMENT NOTE   Patient Name: Walter [name] MRN: [id number] DOB:05/03/1956, 65 y.o., male Today's Date: 01/03/2021  PCP: Clovis Riley, L.August Saucer, MD REFERRING PROVIDER: Clovis Riley, Elbert Ewings.August Saucer, MD   PT End of Session - 01/03/21 1134     Visit Number 2    Number of Visits 17    Date for PT Re-Evaluation 02/20/21    Authorization Type Medicare 12/26/20 to 02/20/21    Progress Note Due on Visit 10    PT Start Time 1145    PT Stop Time 1230    PT Time Calculation (min) 45 min    Equipment Utilized During Treatment Gait belt    Activity Tolerance Patient tolerated treatment well    Behavior During Therapy Impulsive             Past Medical History:  Diagnosis Date   Atrial flutter (HCC)    s/p ablation   DVT (deep venous thrombosis) (HCC)    Near syncope 09/25/2013   Paroxysmal atrial fibrillation (HCC)    Stroke [facility])    initially hemorragic (not on OAC) followed by subsequent embolic stroke   Past Surgical History:  Procedure Laterality Date   A-FLUTTER ABLATION N/A 03/09/2020   Procedure: A-FLUTTER ABLATION;  Surgeon: Hillis Range, MD;  [facility] INVASIVE CV LAB;  Service: Cardiovascular;  Laterality: N/A;   CARDIOVERSION N/A 09/27/2013   Procedure: CARDIOVERSION;  Surgeon: Lewayne Bunting, MD;  Location: [facility] ENDOSCOPY;  Service: Cardiovascular;  Laterality: N/A;   IR ANGIO EXTERNAL CAROTID SEL EXT CAROTID UNI R MOD SED  09/26/2020   IR ANGIO INTRA EXTRACRAN SEL COM CAROTID INNOMINATE BILAT MOD SED  09/26/2020   IR ANGIO VERTEBRAL SEL VERTEBRAL UNI R MOD SED  09/26/2020   IR IVC FILTER PLMT / S&I /IMG GUID/MOD SED  10/08/2020   IR IVUS EACH ADDITIONAL NON CORONARY VESSEL  11/02/2020   IR PTA VENOUS EXCEPT DIALYSIS CIRCUIT  11/02/2020   IR THROMBECT VENO MECH MOD SED  10/27/2020   IR THROMBECT VENO MECH MOD SED  11/02/2020   IR US GUIDE VASC ACCESS LEFT  10/27/2020   IR US GUIDE VASC ACCESS RIGHT  09/26/2020   IR US GUIDE VASC ACCESS RIGHT  11/02/2020   IR  VENO/EXT/UNI RIGHT  11/02/2020   IR VENOCAVAGRAM IVC  10/27/2020   RETINAL DETACHMENT SURGERY     right knee arthroscopy     TEE WITHOUT CARDIOVERSION N/A 09/27/2013   Procedure: TRANSESOPHAGEAL ECHOCARDIOGRAM (TEE);  Surgeon: Lewayne Bunting, MD;  Location: [facility] ENDOSCOPY;  Service: Cardiovascular;  Laterality: N/A;   WRIST SURGERY     Patient Active Problem List   Diagnosis Date Noted   Thrombosis    Sleep disturbance    Dysphagia, post-stroke    PAF (paroxysmal atrial fibrillation) (HCC)    Acute pulmonary embolism without acute cor pulmonale (HCC)    Malnutrition of moderate degree 11/04/2020   Tracheostomy care (HCC)    Pressure injury of skin 10/23/2020   Intracranial hemorrhage (HCC)    Atrial fibrillation (HCC)    Prediabetes    Leukocytosis    Acute blood loss anemia    Hypernatremia    ICH (intracerebral hemorrhage) (HCC) 09/25/2020   Unilateral primary osteoarthritis, left knee 09/26/2016   Atrial flutter (HCC) 09/25/2013   Near syncope 09/25/2013    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 Decreased, improved ventricle size since 10/06/2020 with stable small volume of IVH. 2. Hematoma and edema along the right superior approach EVD tract is stable, including a small volume of bilateral SAH. 3. Expected evolution of the left midbrain/thalamic hemorrhage and edema. 4. Small additional scattered subacute infarcts remain largely occult by CT. 5. No new intracranial abnormality identified.     IMPRESSION: 1. Similar appearance of intraventricular hemorrhage as well as hemorrhage along prior EVD tract in the right frontal lobe and scattered cortical subarachnoid hemorrhage. 2. Ventricular size remains  stable. 3. No significant change in size of intraparenchymal hemorrhage centered within the left side of the midbrain extending into the thalamic pulvinar and superior cerebellar peduncle. 4. A few small foci of restricted diffusion within the left frontal and parietal lobes, corresponding to small acute/subacute infarcts  PRECAUTIONS: fall  SUBJECTIVE: No new pain to note, no falls to report  Objective: See treatment notes   TODAY'S TREATMENT: BERG score 11/56, 5x STS 35.9s with CGA Balance training in //bars, fwd and sidestep 10x per LE, marching alt pattern and heel/toe raises Stepping tasks fwd using 4" block, 10x per LE with fwd WS in // bars Latissimus press with deep inspiration 10 reps  Tapping floor targets, single target in center 10x alt in // bars 2 targets lateral of center, 10x alt in //bars    PATIENT EDUCATION: Education details: sit to stand instructions for safety Person educated: Patient and Spouse Education method: Explanation, Demonstration, and Verbal cues Education comprehension: verbalized understanding     HOME EXERCISE PROGRAM: Sit to stand ASSESSMENT:   CLINICAL IMPRESSION: Attempted at assess BERG but patient too ataxic to complete higher level challenges safely, performed transfer training and balance stepping tasks in // bars requiring CGA to SBA for safety, able to assess 5x STS and introduce standing and stepping tasks within confines of // bars.  Patient fatigues easily and requires cues to rest.  REHAB POTENTIAL: Good   CLINICAL DECISION MAKING: Stable/uncomplicated GOALS: Goals reviewed with patient? Yes   SHORT TERM GOALS:   STG Name Target Date Goal status  1 Patient will be able to perform chair to chair transfer with CGA/SBA with use of RW for safety to improve independence. Baseline: min to mod A (12/26/20) 01/23/2021 01/03/21 MinA/CGA to transfer in/out of WC  2 Pt will be able to ambulate 115' with RW with CGA to min A with RW for  safety to improve independence Baseline: min to mod A with RW 145' 01/23/2021 INITIAL  3 Pt will be able to maintain standing balance for 30 sec to improve static balance Baseline: 10 sec before needing help due to LOB (12/26/20) 01/23/2021 01/03/21 Able to stand 2 min with CGA    LONG TERM GOALS:   LTG Name Target Date Goal status  1 Pt will demo >5 points improvement on Berg Balance Scale to improve static balance Baseline: TBD 02/20/2021 INITIAL  2 Pt will be able to ambulate >500 feet with RW and SBA to improve walking endurance and safety. Baseline: 145' with RW min to mod A 02/20/2021 INITIAL  3 Pt will be able to perform gait speed improvement by 0.49m/s to improve community ambulation. Baseline:TBD 02/20/2021 INITIAL  4 Pt will demo 5 sec improvement in 5x sit to stand (with or without UE support) to improve functional strength Baseline: TBD 02/20/2021 INITIAL    PLAN: PT FREQUENCY: 2x/week   PT  DURATION: 8 weeks   PLANNED INTERVENTIONS: Therapeutic exercises, Therapeutic activity, Neuro Muscular re-education, Balance training, Gait training, Patient/Family education, Joint mobilization, Stair training, Visual/preceptual remediation/compensation, Wheelchair mobility training, Cryotherapy, Moist heat, and Manual therapy   PLAN FOR NEXT SESSION: Gait with RW and WC follow, standing stepping tasks to improve SLS time, coordination tasks assessing for form and accuracy    Hildred Laser PT 01/03/2021, 11:35 AM    [facility] [address] [place], [place], [address] Phone: [phone]   Fax:  [phone]  Patient name: Walter [name] MRN: [id number] DOB: [birth year]

## 2021-01-03 NOTE — Therapy (Signed)
[facility] [address] [place], [place], [address] Phone: [phone]   Fax:  [phone]  Speech Language Pathology Treatment  Patient Details  Name: Walter [name] MRN: [id number] Date of Birth: [birth year] Referring Provider (SLP): Erick Colace, MD   Encounter Date: 01/03/2021   End of Session - 01/03/21 1615     Visit Number 2    Number of Visits 17    Date for SLP Re-Evaluation 02/21/21    Authorization Type Medicare    SLP Start Time 1232    SLP Stop Time  1315    SLP Time Calculation (min) 43 min    Activity Tolerance Patient tolerated treatment well             Past Medical History:  Diagnosis Date   Atrial flutter (HCC)    s/p ablation   DVT (deep venous thrombosis) (HCC)    Near syncope 09/25/2013   Paroxysmal atrial fibrillation (HCC)    Stroke [facility])    initially hemorragic (not on OAC) followed by subsequent embolic stroke    Past Surgical History:  Procedure Laterality Date   A-FLUTTER ABLATION N/A 03/09/2020   Procedure: A-FLUTTER ABLATION;  Surgeon: Hillis Range, MD;  [facility]: [facility] INVASIVE CV LAB;  Service: Cardiovascular;  Laterality: N/A;   CARDIOVERSION N/A 09/27/2013   Procedure: CARDIOVERSION;  Surgeon: Lewayne Bunting, MD;  Location: [facility] ENDOSCOPY;  Service: Cardiovascular;  Laterality: N/A;   IR ANGIO EXTERNAL CAROTID SEL EXT CAROTID UNI R MOD SED  09/26/2020   IR ANGIO INTRA EXTRACRAN SEL COM CAROTID INNOMINATE BILAT MOD SED  09/26/2020   IR ANGIO VERTEBRAL SEL VERTEBRAL UNI R MOD SED  09/26/2020   IR IVC FILTER PLMT / S&I /IMG GUID/MOD SED  10/08/2020   IR IVUS EACH ADDITIONAL NON CORONARY VESSEL  11/02/2020   IR PTA VENOUS EXCEPT DIALYSIS CIRCUIT  11/02/2020   IR THROMBECT VENO MECH MOD SED  10/27/2020   IR THROMBECT VENO MECH MOD SED  11/02/2020   IR US GUIDE VASC ACCESS LEFT  10/27/2020   IR US GUIDE VASC ACCESS RIGHT  09/26/2020   IR US GUIDE VASC ACCESS RIGHT  11/02/2020   IR  VENO/EXT/UNI RIGHT  11/02/2020   IR VENOCAVAGRAM IVC  10/27/2020   RETINAL DETACHMENT SURGERY     right knee arthroscopy     TEE WITHOUT CARDIOVERSION N/A 09/27/2013   Procedure: TRANSESOPHAGEAL ECHOCARDIOGRAM (TEE);  Surgeon: Lewayne Bunting, MD;  Location: [facility] ENDOSCOPY;  Service: Cardiovascular;  Laterality: N/A;   WRIST SURGERY      There were no vitals filed for this visit.   Subjective Assessment - 01/03/21 1234     Subjective "wonderful" re: holiday weekend    Currently in Pain? No/denies                   ADULT SLP TREATMENT - 01/03/21 1235       General Information   Behavior/Cognition Alert;Cooperative;Pleasant mood      Treatment Provided   Treatment provided Cognitive-Linquistic      Cognitive-Linquistic Treatment   Treatment focused on Cognition;Dysarthria;Patient/family/caregiver education    Skilled Treatment SLP educated patient abdominal breathing, with usual mod cues required to neutralize head postioning, reduce tension across upper body, and generate abdominal breath. SLP completed CLQT this session to assess cognitive skills, which indicated moderate deficits for executive fucntions and visuospatial skills and  mild deficits for attention and memory. Intermittent awareness of errors exhibited in discussion of  results.      Assessment / Recommendations / Plan   Plan Continue with current plan of care;Goals updated      Progression Toward Goals   Progression toward goals Progressing toward goals              SLP Education - 01/03/21 1615     Education Details abdominal breathing, CLQT results    Person(s) Educated Patient    Methods Explanation;Demonstration;Handout    Comprehension Verbalized understanding;Returned demonstration;Need further instruction              SLP Short Term Goals - 01/03/21 1251       SLP SHORT TERM GOAL #1   Title Pt will complete dysarthria HEP with occasional min A over 2 sessions    Time 4    Period  Weeks    Status On-going      SLP SHORT TERM GOAL #2   Title Pt will produce loud /a/ or "hey!" with at least low 90s dB average over 2 sessions    Time 4    Period Weeks    Status On-going      SLP SHORT TERM GOAL #3   Title Pt will generate abdominal breathing in 16/20 answers to SLP questions over 2 sessions    Time 4    Period Weeks    Status On-going      SLP SHORT TERM GOAL #4   Title Pt will demonstrate dysarthria compensations in 10 minute simple to mod complex conversation to be 75% intelligibile with occasional min A over 2 sessions    Time 4    Period Weeks    Status On-going      SLP SHORT TERM GOAL #5   Title Pt will complete formal cognitive linguistic assessment in first 1-2 ST sessions    Time --    Period --    Status Achieved      Additional Short Term Goals   Additional Short Term Goals Yes      SLP SHORT TERM GOAL #6   Title Pt will use memory and attention compensations for appointments, medicine management, and other daily activities with occasional min A over 2 sessions    Time 4    Period Weeks    Status New              SLP Long Term Goals - 01/03/21 1251       SLP LONG TERM GOAL #1   Title Pt will complete dysarthria HEP with rare min A over 2 sessions    Time 8    Period Weeks    Status On-going      SLP LONG TERM GOAL #2   Title Pt will demonstrate dysarthria compensations in 15+ minute simple to mod complex conversation to be 90% intelligibile with rare min A over 2 sessions    Time 8    Period Weeks    Status On-going      SLP LONG TERM GOAL #3   Title Pt will demo improved sustained and selective attention during therapeutic tasks with less than 3 cues for active listening during 2 sessions    Time 8    Period Weeks    Status On-going      SLP LONG TERM GOAL #4   Title Pt will demonstrate emergent and anticipatory awareness of impulsivity with less than 5 cues to modify behaviors during 2 sessions    Time 8    Period  Weeks  Status On-going      SLP LONG TERM GOAL #5   Title Pt will report reduced frustration and improved communication effectiveness via PROM by 2 points at last ST sessions    Baseline CES: 15.5 & V-RQOL:35    Time 8    Period Weeks    Status On-going              Plan - 01/03/21 1616     Clinical Impression Statement "Walter [name]" presents for OPST intervention secondary to stroke in March 2022. SLP initiated training of abdominal breathing to optimize breath support related to dysarthria. CLQT completed this session, which indicated moderate deficits for executive functioning and visuospatial skills and mild deficits for attention and memory. SLP discussed results and implications with patient. Skilled ST is warranted to address dysarthria and cognition to maximize communication effectiveness and optimize functional independence and safety at home.    Speech Therapy Frequency 2x / week    Duration 8 weeks   or 17 visits   Treatment/Interventions Cueing hierarchy;Functional tasks;Patient/family education;Cognitive reorganization;Multimodal communcation approach;Language facilitation;Compensatory techniques;Internal/external aids;SLP instruction and feedback    Potential to Achieve Goals Fair    SLP Home Exercise Plan provided    Consulted and Agree with Plan of Care Patient             Patient will benefit from skilled therapeutic intervention in order to improve the following deficits and impairments:   Cognitive communication deficit  Dysarthria and anarthria    Problem List Patient Active Problem List   Diagnosis Date Noted   Thrombosis    Sleep disturbance    Dysphagia, post-stroke    PAF (paroxysmal atrial fibrillation) (HCC)    Acute pulmonary embolism without acute cor pulmonale (HCC)    Malnutrition of moderate degree 11/04/2020   Tracheostomy care (HCC)    Pressure injury of skin 10/23/2020   Intracranial hemorrhage (HCC)    Atrial fibrillation (HCC)     Prediabetes    Leukocytosis    Acute blood loss anemia    Hypernatremia    ICH (intracerebral hemorrhage) (HCC) 09/25/2020   Unilateral primary osteoarthritis, left knee 09/26/2016   Atrial flutter (HCC) 09/25/2013   Near syncope 09/25/2013    Janann Colonel, [name] 01/03/2021, 4:24 PM  [facility] [address] [place], [place], [address] Phone: [phone]   Fax:  [phone]   Name: JAKEIM SEDORE MRN: [id number] Date of Birth: 1956-06-08

## 2021-01-08 ENCOUNTER — Ambulatory Visit: Payer: Medicare Other

## 2021-01-08 ENCOUNTER — Other Ambulatory Visit: Payer: Self-pay

## 2021-01-08 DIAGNOSIS — R278 Other lack of coordination: Secondary | ICD-10-CM

## 2021-01-08 DIAGNOSIS — R471 Dysarthria and anarthria: Secondary | ICD-10-CM

## 2021-01-08 DIAGNOSIS — R41841 Cognitive communication deficit: Secondary | ICD-10-CM | POA: Diagnosis not present

## 2021-01-08 DIAGNOSIS — R2681 Unsteadiness on feet: Secondary | ICD-10-CM

## 2021-01-08 DIAGNOSIS — M6281 Muscle weakness (generalized): Secondary | ICD-10-CM

## 2021-01-08 DIAGNOSIS — R260 Ataxic gait: Secondary | ICD-10-CM

## 2021-01-08 NOTE — Therapy (Signed)
OUTPATIENT PHYSICAL THERAPY TREATMENT NOTE   Patient Name: Walter [name] MRN: [id number] DOB:[birth year], 65 y.o., male Today's Date: 01/08/2021  PCP: Clovis Riley, L.August Saucer, MD REFERRING PROVIDER: Erick Colace, MD   PT End of Session - 01/08/21 1530     Visit Number 3    Number of Visits 17    Date for PT Re-Evaluation 02/20/21    Authorization Type Medicare 12/26/20 to 02/20/21    Progress Note Due on Visit 10    PT Start Time 1530    PT Stop Time 1615    PT Time Calculation (min) 45 min    Equipment Utilized During Treatment Gait belt    Activity Tolerance Patient tolerated treatment well    Behavior During Therapy Impulsive             Past Medical History:  Diagnosis Date   Atrial flutter (HCC)    s/p ablation   DVT (deep venous thrombosis) (HCC)    Near syncope 09/25/2013   Paroxysmal atrial fibrillation (HCC)    Stroke [facility])    initially hemorragic (not on OAC) followed by subsequent embolic stroke   Past Surgical History:  Procedure Laterality Date   A-FLUTTER ABLATION N/A 03/09/2020   Procedure: A-FLUTTER ABLATION;  Surgeon: Hillis Range, MD;  Location: [facility] INVASIVE [facility];  Service: Cardiovascular;  Laterality: N/A;   CARDIOVERSION N/A 09/27/2013   Procedure: CARDIOVERSION;  Surgeon: Lewayne Bunting, MD;  Location: [facility] ENDOSCOPY;  Service: Cardiovascular;  Laterality: N/A;   IR ANGIO EXTERNAL CAROTID SEL EXT CAROTID UNI R MOD SED  09/26/2020   IR ANGIO INTRA EXTRACRAN SEL COM CAROTID INNOMINATE BILAT MOD SED  09/26/2020   IR ANGIO VERTEBRAL SEL VERTEBRAL UNI R MOD SED  09/26/2020   IR IVC FILTER PLMT / S&I /IMG GUID/MOD SED  10/08/2020   IR IVUS EACH ADDITIONAL NON CORONARY VESSEL  11/02/2020   IR PTA VENOUS EXCEPT DIALYSIS CIRCUIT  11/02/2020   IR THROMBECT VENO MECH MOD SED  10/27/2020   IR THROMBECT VENO MECH MOD SED  11/02/2020   IR US GUIDE VASC ACCESS LEFT  10/27/2020   IR US GUIDE VASC ACCESS RIGHT  09/26/2020   IR US GUIDE VASC ACCESS RIGHT  11/02/2020   IR  VENO/EXT/UNI RIGHT  11/02/2020   IR VENOCAVAGRAM IVC  10/27/2020   RETINAL DETACHMENT SURGERY     right knee arthroscopy     TEE WITHOUT CARDIOVERSION N/A 09/27/2013   Procedure: TRANSESOPHAGEAL ECHOCARDIOGRAM (TEE);  Surgeon: Lewayne Bunting, MD;  Location: [facility] ENDOSCOPY;  Service: Cardiovascular;  Laterality: N/A;   WRIST SURGERY     Patient Active Problem List   Diagnosis Date Noted   Thrombosis    Sleep disturbance    Dysphagia, post-stroke    PAF (paroxysmal atrial fibrillation) (HCC)    Acute pulmonary embolism without acute cor pulmonale (HCC)    Malnutrition of moderate degree 11/04/2020   Tracheostomy care (HCC)    Pressure injury of skin 10/23/2020   Intracranial hemorrhage (HCC)    Atrial fibrillation (HCC)    Prediabetes    Leukocytosis    Acute blood loss anemia    Hypernatremia    ICH (intracerebral hemorrhage) (HCC) 09/25/2020   Unilateral primary osteoarthritis, left knee 09/26/2016   Atrial flutter (HCC) 09/25/2013   Near syncope 09/25/2013    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 Decreased, improved ventricle size since 10/06/2020 with stable small volume of IVH. 2. Hematoma and edema along the right superior approach EVD tract is stable, including a small volume of bilateral SAH. 3. Expected evolution of the left midbrain/thalamic hemorrhage and edema. 4. Small additional scattered subacute infarcts remain largely occult by CT. 5. No new intracranial abnormality identified.     IMPRESSION: 1. Similar appearance of intraventricular hemorrhage as well as hemorrhage along prior EVD tract in the right frontal lobe and scattered cortical subarachnoid  hemorrhage. 2. Ventricular size remains stable. 3. No significant change in size of intraparenchymal hemorrhage centered within the left side of the midbrain extending into the thalamic pulvinar and superior cerebellar peduncle. 4. A few small foci of restricted diffusion within the left frontal and parietal lobes, corresponding to small acute/subacute infarcts  PRECAUTIONS: fall  SUBJECTIVE: No falls or med changes to note, tied today as he attended  funeral this AM  Objective: See treatment notes   TODAY'S TREATMENT:   01/08/21  Supine bridge, alternating marching, hip fallouts, alternating UE F/E all 2x15  Seated LAQs, marching, 2x15 performed with lat press    01/03/21 BERG score 11/56, 5x STS 35.9s with CGA Balance training in //bars, fwd and sidestep 10x per LE, marching alt pattern and heel/toe raises Stepping tasks fwd using 4" block, 10x per LE with fwd WS in // bars Latissimus press with deep inspiration 10 reps  Tapping floor targets, single target in center 10x alt in // bars 2 targets lateral of center, 10x alt in //bars    PATIENT EDUCATION: Education details: sit to stand instructions for safety Person educated: Patient and Spouse Education method: Explanation, Demonstration, and Verbal cues Education comprehension: verbalized understanding     HOME EXERCISE PROGRAM: Sit to stand  ASSESSMENT:   CLINICAL IMPRESSION: Todays session focued on LE strengthening and controlling movement and encouraging fluid motions ans alternating patterns.  UE tasks added as well to promo dissociation from LEs.  Some TCs needed to control pace and form of exercises.  Patient too fatigued following LE strengthening to attempt standing tasks as he was at a funeral for a close friend this AM.  REHAB POTENTIAL: Good   CLINICAL DECISION MAKING: Stable/uncomplicated GOALS: Goals reviewed with patient? Yes   SHORT TERM GOALS:   STG Name Target Date Goal status  1 Patient will  be able to perform chair to chair transfer with CGA/SBA with use of RW for safety to improve independence. Baseline: min to mod A (12/26/20) 01/23/2021 01/03/21 MinA/CGA to transfer in/out of WC  2 Pt will be able to ambulate 115' with RW with CGA to min A with RW for safety to improve independence Baseline: min to mod A with RW 145' 01/23/2021 INITIAL  3 Pt will be able to maintain standing balance for 30 sec to improve static balance Baseline: 10 sec before needing help due to LOB (12/26/20) 01/23/2021 01/03/21 Able to stand 2 min with CGA    LONG TERM GOALS:   LTG Name Target Date Goal status  1 Pt will demo >5 points improvement on Berg Balance Scale to improve static balance Baseline: TBD 02/20/2021 INITIAL  2 Pt will be able to ambulate >500 feet with RW and SBA to improve walking endurance and safety. Baseline: 145' with RW min to mod A 02/20/2021 INITIAL  3 Pt will be able to perform gait speed  improvement by 0.36m/s to improve community ambulation. Baseline:TBD 02/20/2021 INITIAL  4 Pt will demo 5 sec improvement in 5x sit to stand (with or without UE support) to improve functional strength Baseline: TBD 02/20/2021 INITIAL    PLAN: PT FREQUENCY: 2x/week   PT DURATION: 8 weeks   PLANNED INTERVENTIONS: Therapeutic exercises, Therapeutic activity, Neuro Muscular re-education, Balance training, Gait training, Patient/Family education, Joint mobilization, Stair training, Visual/preceptual remediation/compensation, Wheelchair mobility training, Cryotherapy, Moist heat, and Manual therapy   PLAN FOR NEXT SESSION: Standing tasks in // bars, floor targets and stepping tasks, Scifit?    Hildred Laser PT 01/08/2021, 3:32 PM    [facility] [address] [place], [place], [address] Phone: [phone]   Fax:  [phone]  Patient name: Walter [name] MRN: [id number] DOB: 05/09/1956

## 2021-01-08 NOTE — Therapy (Signed)
[facility] [address] [place], [place], [address] Phone: [phone]   Fax:  [phone]  Speech Language Pathology Treatment  Patient Details  Name: Walter [name] MRN: [id number] Date of Birth: 25-Dec-1955 Referring Provider (SLP): Erick Colace, MD   Encounter Date: 01/08/2021   End of Session - 01/08/21 1617     Visit Number 3    Number of Visits 17    Date for SLP Re-Evaluation 02/21/21    Authorization Type Medicare    SLP Start Time 1450    SLP Stop Time  1530    SLP Time Calculation (min) 40 min    Activity Tolerance Patient tolerated treatment well             Past Medical History:  Diagnosis Date   Atrial flutter (HCC)    s/p ablation   DVT (deep venous thrombosis) (HCC)    Near syncope 09/25/2013   Paroxysmal atrial fibrillation (HCC)    Stroke [facility])    initially hemorragic (not on [facility]) followed by subsequent embolic stroke    Past Surgical History:  Procedure Laterality Date   A-FLUTTER ABLATION N/A 03/09/2020   Procedure: A-FLUTTER ABLATION;  Surgeon: Hillis Range, MD;  Location: [facility] INVASIVE CV LAB;  Service: Cardiovascular;  Laterality: N/A;   CARDIOVERSION N/A 09/27/2013   Procedure: CARDIOVERSION;  Surgeon: Lewayne Bunting, MD;  Location: [facility] ENDOSCOPY;  Service: Cardiovascular;  Laterality: N/A;   IR ANGIO EXTERNAL CAROTID SEL EXT CAROTID UNI R MOD SED  09/26/2020   IR ANGIO INTRA EXTRACRAN SEL COM CAROTID INNOMINATE BILAT MOD SED  09/26/2020   IR ANGIO VERTEBRAL SEL VERTEBRAL UNI R MOD SED  09/26/2020   IR IVC FILTER PLMT / S&I /IMG GUID/MOD SED  10/08/2020   IR IVUS EACH ADDITIONAL NON CORONARY VESSEL  11/02/2020   IR PTA VENOUS EXCEPT DIALYSIS CIRCUIT  11/02/2020   IR THROMBECT VENO MECH MOD SED  10/27/2020   IR THROMBECT VENO MECH MOD SED  11/02/2020   IR US GUIDE VASC ACCESS LEFT  10/27/2020   IR US GUIDE VASC ACCESS RIGHT  09/26/2020   IR US GUIDE VASC ACCESS RIGHT  11/02/2020   IR  VENO/EXT/UNI RIGHT  11/02/2020   IR VENOCAVAGRAM IVC  10/27/2020   RETINAL DETACHMENT SURGERY     right knee arthroscopy     TEE WITHOUT CARDIOVERSION N/A 09/27/2013   Procedure: TRANSESOPHAGEAL ECHOCARDIOGRAM (TEE);  Surgeon: Lewayne Bunting, MD;  Location: [facility] ENDOSCOPY;  Service: Cardiovascular;  Laterality: N/A;   WRIST SURGERY      There were no vitals filed for this visit.   Subjective Assessment - 01/08/21 1454     Subjective "I woke with a hemmorhagic stroke."    Currently in Pain? No/denies                   ADULT SLP TREATMENT - 01/08/21 1454       General Information   Behavior/Cognition Alert;Cooperative;Pleasant mood      Treatment Provided   Treatment provided Cognitive-Linquistic      Cognitive-Linquistic Treatment   Treatment focused on Cognition;Dysarthria;Patient/family/caregiver education    Skilled Treatment Pt was inaudible upon SLP greeting and upon entering ST room - SLP req'd careful listening and door closed to hear pt. Thus, SLP worked with pt on loud /a/, but due to strained/strangled /a/ with mild cough 2/4, SLP used terms like "a strong voice" and "increased effort" instead of "loud" to emphasize more effort,  instead of shouting/loud voice which resulted in hyperphonia. Generally, using these modified terms were successful in pt keeping limited reps of strained/strangled voice and still achieving louder productions. Possible pt has reduced control of his laryngeal musculature at this time as well. SLP targeted pt using tactile and model cues for "putting air in your belly" but these were with mixed results. SLP had pt repeat conversational sentences with louder speech and pt was successful achieving louder but not strained/strangled or a soft voice 75% of the time. Pt then read sentences with 80% success - errors were with a softer voice than WNL. Pt homework to read conversational sentences with a good breath prior to each one, twice a day.       Assessment / Recommendations / Plan   Plan Continue with current plan of care;Goals updated      Progression Toward Goals   Progression toward goals Progressing toward goals              SLP Education - 01/08/21 1616     Education Details AB, speaking with more effort    Person(s) Educated Patient    Methods Explanation;Demonstration;Tactile cues;Verbal cues;Handout    Comprehension Verbalized understanding;Returned demonstration;Verbal cues required;Tactile cues required;Need further instruction              SLP Short Term Goals - 01/08/21 1619       SLP SHORT TERM GOAL #1   Title Pt will complete dysarthria HEP with occasional min A over 2 sessions    Time 3    Period Weeks    Status On-going      SLP SHORT TERM GOAL #2   Title Pt will produce loud /a/ or "hey!" with at least low 90s dB average over 2 sessions    Time 3    Period Weeks    Status On-going      SLP SHORT TERM GOAL #3   Title Pt will generate abdominal breathing in 16/20 answers to SLP questions over 2 sessions    Time 3    Period Weeks    Status On-going      SLP SHORT TERM GOAL #4   Title Pt will demonstrate dysarthria compensations in 10 minute simple to mod complex conversation to be 75% intelligibile with occasional min A over 2 sessions    Time 3    Period Weeks    Status On-going      SLP SHORT TERM GOAL #5   Title Pt will complete formal cognitive linguistic assessment in first 1-2 ST sessions    Status Achieved      SLP SHORT TERM GOAL #6   Title Pt will use memory and attention compensations for appointments, medicine management, and other daily activities with occasional min A over 2 sessions    Time 4    Period Weeks    Status New              SLP Long Term Goals - 01/08/21 1619       SLP LONG TERM GOAL #1   Title Pt will complete dysarthria HEP with rare min A over 2 sessions    Time 7    Period Weeks    Status On-going      SLP LONG TERM GOAL #2   Title Pt  will demonstrate dysarthria compensations in 15+ minute simple to mod complex conversation to be 90% intelligibile with rare min A over 2 sessions    Time 7  Period Weeks    Status On-going      SLP LONG TERM GOAL #3   Title Pt will demo improved sustained and selective attention during therapeutic tasks with less than 3 cues for active listening during 2 sessions    Time 7    Period Weeks    Status On-going      SLP LONG TERM GOAL #4   Title Pt will demonstrate emergent and anticipatory awareness of impulsivity with less than 5 cues to modify behaviors during 2 sessions    Time 7    Period Weeks    Status On-going      SLP LONG TERM GOAL #5   Title Pt will report reduced frustration and improved communication effectiveness via PROM by 2 points at last ST sessions    Baseline CES: 15.5 & V-RQOL:35    Time 7    Period Weeks    Status On-going              Plan - 01/08/21 1617     Clinical Impression Statement "[place]" presents for OPST intervention secondary to stroke in March 2022. SLP continued training pt in breath support, and audible, intelligible speech. Skilled ST is warranted to address dysarthria and cognition to maximize communication effectiveness and optimize functional independence and safety at home.    Speech Therapy Frequency 2x / week    Duration 8 weeks   or 17 visits   Treatment/Interventions Cueing hierarchy;Functional tasks;Patient/family education;Cognitive reorganization;Multimodal communcation approach;Language facilitation;Compensatory techniques;Internal/external aids;SLP instruction and feedback    Potential to Achieve Goals Fair    SLP Home Exercise Plan provided    Consulted and Agree with Plan of Care Patient             Patient will benefit from skilled therapeutic intervention in order to improve the following deficits and impairments:   Dysarthria and anarthria  Cognitive communication deficit    Problem List Patient Active  Problem List   Diagnosis Date Noted   Thrombosis    Sleep disturbance    Dysphagia, post-stroke    PAF (paroxysmal atrial fibrillation) (HCC)    Acute pulmonary embolism without acute cor pulmonale (HCC)    Malnutrition of moderate degree 11/04/2020   Tracheostomy care (HCC)    Pressure injury of skin 10/23/2020   Intracranial hemorrhage (HCC)    Atrial fibrillation (HCC)    Prediabetes    Leukocytosis    Acute blood loss anemia    Hypernatremia    ICH (intracerebral hemorrhage) (HCC) 09/25/2020   Unilateral primary osteoarthritis, left knee 09/26/2016   Atrial flutter (HCC) 09/25/2013   Near syncope 09/25/2013    [facility] ,MS, CCC-SLP  01/08/2021, 4:20 PM  [facility] [address] [place], [place], [address] Phone: [phone]   Fax:  [phone]   Name: TEKOA AMON MRN: [id number] Date of Birth: January 24, 1956

## 2021-01-08 NOTE — Patient Instructions (Signed)
   Read the sentences twice a day with a good breath beforehand.

## 2021-01-09 ENCOUNTER — Encounter: Payer: Medicare Other | Attending: Physical Medicine & Rehabilitation | Admitting: Physical Medicine & Rehabilitation

## 2021-01-09 ENCOUNTER — Encounter: Payer: Self-pay | Admitting: Physical Medicine & Rehabilitation

## 2021-01-09 VITALS — BP 117/74 | HR 89 | Temp 98.6°F | Ht 72.0 in | Wt 179.4 lb

## 2021-01-09 DIAGNOSIS — I613 Nontraumatic intracerebral hemorrhage in brain stem: Secondary | ICD-10-CM | POA: Insufficient documentation

## 2021-01-09 DIAGNOSIS — I639 Cerebral infarction, unspecified: Secondary | ICD-10-CM | POA: Diagnosis not present

## 2021-01-09 MED ORDER — QUETIAPINE FUMARATE 25 MG PO TABS: 25.0000 mg | ORAL_TABLET | Freq: Every day | ORAL | 1 refills | Status: DC

## 2021-01-09 NOTE — Patient Instructions (Signed)
Ask urology if a anticholinergic medicine (like oxybutnin) would be helpful for bladder urgency without causing retention  Stop trazodone  Start seroquel

## 2021-01-09 NOTE — Progress Notes (Signed)
Subjective:    Patient ID: Walter [name], male    DOB: [birth year], 65 y.o.   MRN: [id number] 65 y.o. right-handed male with history of atrial fibrillation with ablation not on anticoagulation.  Lives with spouse independent prior to admission working as a Veterinary surgeon.  Presented 09/17/2020 with right side weakness headache and dysarthria as well as vomiting.  Cranial CT/MRI showed acute intraparenchymal hemorrhage in the dorsal left pons with subarachnoid extension into the cerebral aqueduct and fourth ventricle.  CT angiogram head and neck no vascular malformation anomaly or aneurysm.  Echocardiogram with ejection fraction of 55 to 60% grade 1 diastolic dysfunction no regional wall motion abnormalities.  Admission chemistries unremarkable except glucose 169 alcohol negative urine drug screen negative.  Patient did require emergent intubation for airway protection.  Hospital course complicated by hydrocephalus with diagnostic cerebral angiogram completed showing no evidence of aneurysm.  EVD was placed per neurosurgery.  Maintained on 3% hypertonic saline.  Patient extubated 09/28/2020.  Follow-up cardiology services for atrial fibrillation maintained on Cardizem.  Patient was cleared to begin Lovenox for DVT prophylaxis 09/28/2020.  Repeat head CT scan 09/29/2020 showed new intraventricular hemorrhage into the third and lateral ventricles.  Fourth ventricular and midbrain hemorrhage unchanged and again repeated 10/24/2020 showing improved hemorrhage and edema along the shunt tract in the right frontal lobe.  No new area of hemorrhage.  Patient with long-term ventilatory support underwent tracheostomy 10/04/2020 per Dr. Merrily Pew critical care services.  On 10/08/2020 patient found to have bilateral lower extremity DVTs underwent placement of IVC filter per interventional radiology.  Dopplers again completed 10/25/2020 showing acute DVT right common femoral vein and SF junction and femoral vein proximal right profunda vein  right popliteal vein and right posterior tibial vein right peroneal vein right soleal vein and right gastrocnemius vein it was established the patient had an IVC filter maintained on Eliquis which was also initiated.  Patient with fever of unknown origin infectious disease consulted broad-spectrum antibiotics initiated.  SARS coronavirus negative.  CT chest abdomen pelvis did show bilateral large volume right greater than left pulmonary emboli no evidence of right heart strain and was discussed to continue IVC filter as well as Eliquis.  Interventional radiology consulted to discuss possible removal of IVC filter with CT venogram 10/27/2020 showing thrombus involving the IVC and iliac veins.  Large amount of thrombus involving the infrarenal IVC and IVC filter.  Suprarenal IVC was patent.  Thrombus in the right iliac veins and thrombus in the proximal femoral veins bilaterally and plan was to undergo thrombectomy and retrieval of IVC filter 10/27/2020 per Dr. Deanne Coffer of interventional radiology.  Patient initially n.p.o. with alternative means of nutritional support diet slowly advance.    Admit date: 10/28/2020 Discharge date: 11/23/2020 HPI  Patient returns today with primary concern of poor sleep.  The patient actually does not voice this concern however his wife does.  She states that he has been sleeping poorly for a number of days.  He has seen neurology and advised to increase trazodone to 100 mg/day.  This has been done but no change in poor sleep at night.  Patient does nap some during the day.  He had been placed on Nuvigil for daytime drowsiness on 12/22/2020 but obviously this did not help with nighttime sleep issues. The patient also takes Klonopin 0.125 mg but mainly in the afternoon for some anxiety. The patient has started outpatient PT and speech awaiting his outpatient OT evaluation.  Patient's wife would like to  discuss transport 2 study, discussed that this is for ischemic stroke not  hemorrhagic strokes.   Patient continues exhibit some impulsivity but no recent falls. Other concern is frequent urination at night which is also interfering with sleep has an appointment with urology tomorrow Pain Inventory Average Pain 0 Pain Right Now 0 My pain is  no pain  LOCATION OF PAIN  no pain  BOWEL Number of stools per week: not sure Oral laxative use Yes  Type of laxative miralax Enema or suppository use No  History of colostomy No  Incontinent No   BLADDER Normal In and out cath, frequency n/a Able to self cath No  Bladder incontinence No  Frequent urination Yes  Leakage with coughing No  Difficulty starting stream Yes  Incomplete bladder emptying Yes    Mobility walk with assistance ability to climb steps?  yes do you drive?  no use a wheelchair  Function not employed: date last employed not sure I need assistance with the following:  feeding, dressing, bathing, toileting, meal prep, household duties, and shopping  Neuro/Psych bladder control problems bowel control problems weakness numbness tremor tingling trouble walking confusion depression anxiety  Prior Studies N/a  Physicians involved in your care N/a   Family History  Problem Relation Age of Onset   Heart disease Mother    Heart disease Father    Healthy Sister    Healthy Brother    Social History   Socioeconomic History   Marital status: Married    Spouse name: Walter [name]   Number of children: Not on file   Years of education: Not on file   Highest education level: Not on file  Occupational History   Not on file  Tobacco Use   Smoking status: Never   Smokeless tobacco: Never  Substance and Sexual Activity   Alcohol use: Yes    Comment: occasionally   Drug use: No   Sexual activity: Not on file  Other Topics Concern   Not on file  Social History Narrative   Lives with wife at home   Right Handed   Drinks decaf   Social Determinants of Health   Financial  Resource Strain: Not on file  Food Insecurity: Not on file  Transportation Needs: Not on file  Physical Activity: Not on file  Stress: Not on file  Social Connections: Not on file   Past Surgical History:  Procedure Laterality Date   A-FLUTTER ABLATION N/A 03/09/2020   Procedure: A-FLUTTER ABLATION;  Surgeon: Hillis Range, MD;  Location: [facility] IN[facility]ASIVE [facility] LAB;  Service: Cardiovascular;  Laterality: N/A;   CARDIOVERSION N/A 09/27/2013   Procedure: CARDIOVERSION;  Surgeon: Lewayne Bunting, MD;  Location: [facility] ENDOSCOPY;  Service: Cardiovascular;  Laterality: N/A;   IR ANGIO EXTERNAL CAROTID SEL EXT CAROTID UNI R MOD SED  09/26/2020   IR ANGIO INTRA EXTRACRAN SEL COM CAROTID INNOMINATE BILAT MOD SED  09/26/2020   IR ANGIO VERTEBRAL SEL VERTEBRAL UNI R MOD SED  09/26/2020   IR IVC FILTER PLMT / S&I /IMG GUID/MOD SED  10/08/2020   IR IVUS EACH ADDITIONAL NON CORONARY VESSEL  11/02/2020   IR PTA VENOUS EXCEPT DIALYSIS CIRCUIT  11/02/2020   IR THROMBECT VENO MECH MOD SED  10/27/2020   IR THROMBECT VENO MECH MOD SED  11/02/2020   IR US GUIDE VASC ACCESS LEFT  10/27/2020   IR US GUIDE VASC ACCESS RIGHT  09/26/2020   IR US GUIDE VASC ACCESS RIGHT  11/02/2020   IR VENO/EXT/UNI RIGHT  11/02/2020   IR VENOCAVAGRAM IVC  10/27/2020   RETINAL DETACHMENT SURGERY     right knee arthroscopy     TEE WITHOUT CARDIOVERSION N/A 09/27/2013   Procedure: TRANSESOPHAGEAL ECHOCARDIOGRAM (TEE);  Surgeon: Lewayne Bunting, MD;  Location: [facility] ENDOSCOPY;  Service: Cardiovascular;  Laterality: N/A;   WRIST SURGERY     Past Medical History:  Diagnosis Date   Atrial flutter (HCC)    s/p ablation   DVT (deep venous thrombosis) (HCC)    Near syncope 09/25/2013   Paroxysmal atrial fibrillation (HCC)    Stroke [facility])    initially hemorragic (not on OAC) followed by subsequent embolic stroke   BP 117/74 (BP Location: Right Arm, Patient Position: Sitting)   Pulse 89   Temp 98.6 F (37 C) (Oral)   Ht 6' (1.829 m)   SpO2 95%   BMI 24.33  kg/m   Opioid Risk Score:   Fall Risk Score:  `1  Depression screen PHQ 2/9  Depression screen PHQ 2/9 11/30/2020  Decreased Interest 1  Down, Depressed, Hopeless 1  PHQ - 2 Score 2  Altered sleeping 3  Tired, decreased energy 3  Change in appetite 0  Feeling bad or failure about yourself  1  Trouble concentrating 3  Moving slowly or fidgety/restless 3  Suicidal thoughts 1  PHQ-9 Score 16  Difficult doing work/chores Somewhat difficult     Review of Systems  Constitutional: Negative.   HENT: Negative.    Eyes: Negative.   Respiratory: Negative.    Cardiovascular: Negative.   Gastrointestinal: Negative.   Endocrine: Negative.   Genitourinary: Negative.   Musculoskeletal:  Positive for gait problem.  Skin:  Positive for rash.  Allergic/Immunologic: Negative.   Neurological:  Positive for tremors and weakness.  Psychiatric/Behavioral:  Positive for confusion and dysphoric mood. The patient is nervous/anxious.       Objective:   Physical Exam Vitals and nursing note reviewed.  Constitutional:      Appearance: He is normal weight.  HENT:     Head: Normocephalic and atraumatic.  Eyes:     Extraocular Movements: Extraocular movements intact.     Conjunctiva/sclera: Conjunctivae normal.     Pupils: Pupils are equal, round, and reactive to light.  Neurological:     Mental Status: He is alert.     Cranial Nerves: Dysarthria present.     Motor: Weakness present.     Coordination: Coordination abnormal.     Gait: Gait abnormal.     Comments: Ataxic dysarthria  Moderately severe dysmetria left finger-nose-finger and heel-to-shin Motor strength is 4/5 in the right deltoid bicep tricep grip as well as hip flexor knee extensor ankle dorsiflexor and plantar flexor There is decrease rapidity of movement in the right upper extremity Left upper extremity has 5/5 strength with ataxia left lower extremity 5/5 strength with ataxia Sit to stand is very impulsive patient tends to  fall backwards against the chair and wall.  No evidence of spasticity in the upper extremities or lower extremities  Psychiatric:        Attention and Perception: Attention normal.        Mood and Affect: Mood is anxious.        Speech: Speech is tangential.        Behavior: Behavior is cooperative.  Temperature sensation is reduced right upper extremity versus left        Assessment & Plan:  1.  Left pontine and midbrain hemorrhagic infarct with left hemiataxia mild right  hemiparesis, cognitive deficits, right hemisensory deficits as well as left cranial 6 palsy. As discussed with the wife, he is approximately 4 months post CVA and patient has made significant improvement.  Further improvements are expected to occur at a slower pace. We discussed the need to complete his outpatient rehabilitation  #2.  Sleep disturbance multifactorial, there is an anxiety component as well as probable spastic bladder related to stroke.  He will see urology and have asked wife to inquire about medications such as anticholinergics to slow down the bladder if the patient does not have any significant outlet obstructive issues  Will start Seroquel, QTC is normal, start 25 mg nightly may need to titrate upward.  Discontinue trazodone 9 follow-up in 1 month

## 2021-01-10 ENCOUNTER — Ambulatory Visit: Payer: Medicare Other

## 2021-01-10 ENCOUNTER — Other Ambulatory Visit: Payer: Self-pay

## 2021-01-10 DIAGNOSIS — R41841 Cognitive communication deficit: Secondary | ICD-10-CM | POA: Diagnosis not present

## 2021-01-10 DIAGNOSIS — R471 Dysarthria and anarthria: Secondary | ICD-10-CM

## 2021-01-10 NOTE — Therapy (Signed)
[facility] [address] [place], [place], [address] Phone: [phone]   Fax:  [phone]  Speech Language Pathology Treatment  Patient Details  Name: Walter [name] MRN: [id number] Date of Birth: 26-Nov-1955 Referring Provider (SLP): Erick Colace, MD   Encounter Date: 01/10/2021   End of Session - 01/10/21 1329     Visit Number 4    Number of Visits 17    Date for SLP Re-Evaluation 02/21/21    Authorization Type Medicare    SLP Start Time 1237    SLP Stop Time  1322    SLP Time Calculation (min) 45 min    Activity Tolerance Patient tolerated treatment well             Past Medical History:  Diagnosis Date   Atrial flutter (HCC)    s/p ablation   DVT (deep venous thrombosis) (HCC)    Near syncope 09/25/2013   Paroxysmal atrial fibrillation (HCC)    Stroke [facility])    initially hemorragic (not on [facility]) followed by subsequent embolic stroke    Past Surgical History:  Procedure Laterality Date   A-FLUTTER ABLATION N/A 03/09/2020   Procedure: A-FLUTTER ABLATION;  Surgeon: Hillis Range, MD;  Location: [facility] INVASIVE CV LAB;  Service: Cardiovascular;  Laterality: N/A;   CARDIOVERSION N/A 09/27/2013   Procedure: CARDIOVERSION;  Surgeon: Lewayne Bunting, MD;  Location: [facility] ENDOSCOPY;  Service: Cardiovascular;  Laterality: N/A;   IR ANGIO EXTERNAL CAROTID SEL EXT CAROTID UNI R MOD SED  09/26/2020   IR ANGIO INTRA EXTRACRAN SEL COM CAROTID INNOMINATE BILAT MOD SED  09/26/2020   IR ANGIO VERTEBRAL SEL VERTEBRAL UNI R MOD SED  09/26/2020   IR IVC FILTER PLMT / S&I /IMG GUID/MOD SED  10/08/2020   IR IVUS EACH ADDITIONAL NON CORONARY VESSEL  11/02/2020   IR PTA VENOUS EXCEPT DIALYSIS CIRCUIT  11/02/2020   IR THROMBECT VENO MECH MOD SED  10/27/2020   IR THROMBECT VENO MECH MOD SED  11/02/2020   IR US GUIDE VASC ACCESS LEFT  10/27/2020   IR US GUIDE VASC ACCESS RIGHT  09/26/2020   IR US GUIDE VASC ACCESS RIGHT  11/02/2020   IR  VENO/EXT/UNI RIGHT  11/02/2020   IR VENOCAVAGRAM IVC  10/27/2020   RETINAL DETACHMENT SURGERY     right knee arthroscopy     TEE WITHOUT CARDIOVERSION N/A 09/27/2013   Procedure: TRANSESOPHAGEAL ECHOCARDIOGRAM (TEE);  Surgeon: Lewayne Bunting, MD;  Location: [facility] ENDOSCOPY;  Service: Cardiovascular;  Laterality: N/A;   WRIST SURGERY      There were no vitals filed for this visit.   Subjective Assessment - 01/10/21 1239     Subjective "I am a mouth breather"    Currently in Pain? No/denies                   ADULT SLP TREATMENT - 01/10/21 1237       General Information   Behavior/Cognition Alert;Cooperative;Pleasant mood      Treatment Provided   Treatment provided Cognitive-Linquistic      Cognitive-Linquistic Treatment   Treatment focused on Dysarthria;Patient/family/caregiver education    Skilled Treatment SLP reviewed targeted speech strategies from earlier this week, including use of "strong voice" and "increased effort." Pt completed /a/ with mid 70s to low 80s without strain and cued inhalation through nose. Pt reports he is a "mouth breather," with usual fading to occasional verbal and viusal cues required for pt to inhale via nose on  structured speech tasks. SLP modeled how to produce "strong voice" without strain, in which pt able to demonstrate at phrase level with good accuracy. SLP targeted creation of functional sentences for practice at home, in which pt able to ID every day sentences with rare min A. Pt able to verbalize phrases with occasional cues to maximize breath support due to fast rate and implusivity.      Assessment / Recommendations / Plan   Plan Continue with current plan of care      Progression Toward Goals   Progression toward goals Progressing toward goals              SLP Education - 01/10/21 1329     Education Details abdominal breathing, speaking with more effort, awareness of reduced volume in conversation    Person(s) Educated  Patient    Methods Explanation;Demonstration;Handout    Comprehension Verbalized understanding;Returned demonstration;Need further instruction              SLP Short Term Goals - 01/10/21 1329       SLP SHORT TERM GOAL #1   Title Pt will complete dysarthria HEP with occasional min A over 2 sessions    Time 3    Period Weeks    Status On-going      SLP SHORT TERM GOAL #2   Title Pt will produce loud /a/ or "hey!" with at least low 90s dB average over 2 sessions    Time 3    Period Weeks    Status On-going      SLP SHORT TERM GOAL #3   Title Pt will generate abdominal breathing in 16/20 answers to SLP questions over 2 sessions    Time 3    Period Weeks    Status On-going      SLP SHORT TERM GOAL #4   Title Pt will demonstrate dysarthria compensations in 10 minute simple to mod complex conversation to be 75% intelligibile with occasional min A over 2 sessions    Time 3    Period Weeks    Status On-going      SLP SHORT TERM GOAL #5   Title Pt will complete formal cognitive linguistic assessment in first 1-2 ST sessions    Status Achieved      SLP SHORT TERM GOAL #6   Title Pt will use memory and attention compensations for appointments, medicine management, and other daily activities with occasional min A over 2 sessions    Time 3    Period Weeks    Status On-going              SLP Long Term Goals - 01/10/21 1330       SLP LONG TERM GOAL #1   Title Pt will complete dysarthria HEP with rare min A over 2 sessions    Time 7    Period Weeks    Status On-going      SLP LONG TERM GOAL #2   Title Pt will demonstrate dysarthria compensations in 15+ minute simple to mod complex conversation to be 90% intelligibile with rare min A over 2 sessions    Time 7    Period Weeks    Status On-going      SLP LONG TERM GOAL #3   Title Pt will demo improved sustained and selective attention during therapeutic tasks with less than 3 cues for active listening during 2  sessions    Time 7    Period Weeks    Status  On-going      SLP LONG TERM GOAL #4   Title Pt will demonstrate emergent and anticipatory awareness of impulsivity with less than 5 cues to modify behaviors during 2 sessions    Time 7    Period Weeks    Status On-going      SLP LONG TERM GOAL #5   Title Pt will report reduced frustration and improved communication effectiveness via PROM by 2 points at last ST sessions    Baseline CES: 15.5 & V-RQOL:35    Time 7    Period Weeks    Status On-going              Plan - 01/10/21 1330     Clinical Impression Statement "Walter [name]" presents for OPST intervention secondary to stroke in March 2022. SLP continued education and training pt in breath support, and audible, intelligible speech. Usual fading to occasional verbal and visual cues needed to optimize breath support and maximize vocal intensity and clarity. Skilled ST is warranted to address dysarthria and cognition to maximize communication effectiveness and optimize functional independence and safety at home.    Speech Therapy Frequency 2x / week    Duration 8 weeks    Treatment/Interventions Cueing hierarchy;Functional tasks;Patient/family education;Cognitive reorganization;Multimodal communcation approach;Language facilitation;Compensatory techniques;Internal/external aids;SLP instruction and feedback    Potential to Achieve Goals Fair    SLP Home Exercise Plan provided    Consulted and Agree with Plan of Care Patient             Patient will benefit from skilled therapeutic intervention in order to improve the following deficits and impairments:   Dysarthria and anarthria    Problem List Patient Active Problem List   Diagnosis Date Noted   Thrombosis    Sleep disturbance    Dysphagia, post-stroke    PAF (paroxysmal atrial fibrillation) (HCC)    Acute pulmonary embolism without acute cor pulmonale (HCC)    Malnutrition of moderate degree 11/04/2020   Tracheostomy care  (HCC)    Pressure injury of skin 10/23/2020   Intracranial hemorrhage (HCC)    Atrial fibrillation (HCC)    Prediabetes    Leukocytosis    Acute blood loss anemia    Hypernatremia    ICH (intracerebral hemorrhage) (HCC) 09/25/2020   Unilateral primary osteoarthritis, left knee 09/26/2016   Atrial flutter (HCC) 09/25/2013   Near syncope 09/25/2013    Janann Colonel, [name] 01/10/2021, 1:35 PM  [facility] [address] [place], [place], [address] Phone: [phone]   Fax:  [phone]   Name: CATHERINE OAK MRN: [id number] Date of Birth: 11/24/1955

## 2021-01-10 NOTE — Patient Instructions (Signed)
Everyday phrases to practice twice a day  (make sure you breath in through you nose before and speak with a "strong voice")  Where are we going to eat? Bring me the urinal please. I want to take a walk. I need some new clothes. Where is the jacket I wore yesterday? What time is the appointment today? I would like a milkshake.  Hand me my mouth wash.  My wife is wonderful. Have you heard anything from the girls? Where is my phone?

## 2021-01-15 ENCOUNTER — Ambulatory Visit (HOSPITAL_COMMUNITY)
Admission: RE | Admit: 2021-01-15 | Discharge: 2021-01-15 | Disposition: A | Discharge status: Home / Self Care | Payer: Medicare Other | Source: Ambulatory Visit | Attending: Interventional Radiology | Admitting: Interventional Radiology

## 2021-01-15 ENCOUNTER — Encounter (HOSPITAL_COMMUNITY): Payer: Self-pay

## 2021-01-15 ENCOUNTER — Other Ambulatory Visit: Payer: Self-pay

## 2021-01-15 DIAGNOSIS — I8290 Acute embolism and thrombosis of unspecified vein: Secondary | ICD-10-CM | POA: Insufficient documentation

## 2021-01-15 LAB — POCT I-STAT CREATININE: Creatinine, Ser: 0.7 mg/dL (ref 0.61–1.24)

## 2021-01-15 MED ORDER — IOHEXOL 350 MG/ML SOLN
100.0000 mL | Freq: Once | INTRAVENOUS | Status: AC | PRN
  Administered 2021-01-15: 100 mL via INTRAVENOUS

## 2021-01-16 ENCOUNTER — Encounter: Payer: Self-pay | Admitting: Occupational Therapy

## 2021-01-16 ENCOUNTER — Ambulatory Visit: Payer: Medicare Other

## 2021-01-16 ENCOUNTER — Ambulatory Visit: Payer: Medicare Other | Admitting: Occupational Therapy

## 2021-01-16 DIAGNOSIS — R270 Ataxia, unspecified: Secondary | ICD-10-CM

## 2021-01-16 DIAGNOSIS — M6281 Muscle weakness (generalized): Secondary | ICD-10-CM

## 2021-01-16 DIAGNOSIS — R278 Other lack of coordination: Secondary | ICD-10-CM

## 2021-01-16 DIAGNOSIS — R41841 Cognitive communication deficit: Secondary | ICD-10-CM | POA: Diagnosis not present

## 2021-01-16 DIAGNOSIS — R260 Ataxic gait: Secondary | ICD-10-CM

## 2021-01-16 DIAGNOSIS — R208 Other disturbances of skin sensation: Secondary | ICD-10-CM

## 2021-01-16 DIAGNOSIS — R41844 Frontal lobe and executive function deficit: Secondary | ICD-10-CM

## 2021-01-16 DIAGNOSIS — I69218 Other symptoms and signs involving cognitive functions following other nontraumatic intracranial hemorrhage: Secondary | ICD-10-CM

## 2021-01-16 DIAGNOSIS — R471 Dysarthria and anarthria: Secondary | ICD-10-CM

## 2021-01-16 DIAGNOSIS — R2681 Unsteadiness on feet: Secondary | ICD-10-CM

## 2021-01-16 DIAGNOSIS — R41842 Visuospatial deficit: Secondary | ICD-10-CM

## 2021-01-16 NOTE — Addendum Note (Signed)
Addended by: Willa Frater D on: 01/16/2021 01:42 PM   Modules accepted: Orders

## 2021-01-16 NOTE — Patient Instructions (Addendum)
   With your left hand/arm, perform the following for at least 20 min per day.  Go slow and work on control.  Line up 3-5 up bottles (empty) on table away from you,  then reach out to touch each one slowly and lightly, trying not to move them.  Repeat 10x  Pick up empty, plastic cup, bring it to you mouth like you are going to take a drink.  Repeat 10x  Flip playing cards over 1 at a time.  Pick up coins and place in container or coin back (make sure coin bank is away from you so you have to reach).  Make sure you watch your hand and try to lift off coin bank after releasing coin.    Wipe table top forward/back and side to side x15-20 each.  Fold washcloths or towel with both hands.  Stack blocks, dominoes, or checkers.

## 2021-01-16 NOTE — Therapy (Signed)
[facility] [address] [place], [place], [address] Phone: [phone]   Fax:  [phone]  Speech Language Pathology Treatment  Patient Details  Name: Walter [name] MRN: [id number] Date of Birth: [birth year] Referring Provider (SLP): Erick Colace, MD   Encounter Date: 01/16/2021   End of Session - 01/16/21 1604     Visit Number 5    Number of Visits 17    Date for SLP Re-Evaluation 02/21/21    Authorization Type Medicare    SLP Start Time 1103    SLP Stop Time  1145    SLP Time Calculation (min) 42 min    Activity Tolerance Patient tolerated treatment well             Past Medical History:  Diagnosis Date   Atrial flutter (HCC)    s/p ablation   DVT (deep venous thrombosis) (HCC)    Near syncope 09/25/2013   Paroxysmal atrial fibrillation (HCC)    Stroke [facility])    initially hemorragic (not on [facility]) followed by subsequent embolic stroke    Past Surgical History:  Procedure Laterality Date   A-FLUTTER ABLATION N/A 03/09/2020   Procedure: A-FLUTTER ABLATION;  Surgeon: Hillis Range, MD;  [facility]: [facility] INVASIVE CV LAB;  Service: Cardiovascular;  Laterality: N/A;   CARDIOVERSION N/A 09/27/2013   Procedure: CARDIOVERSION;  Surgeon: Lewayne Bunting, MD;  Location: [facility] ENDOSCOPY;  Service: Cardiovascular;  Laterality: N/A;   IR ANGIO EXTERNAL CAROTID SEL EXT CAROTID UNI R MOD SED  09/26/2020   IR ANGIO INTRA EXTRACRAN SEL COM CAROTID INNOMINATE BILAT MOD SED  09/26/2020   IR ANGIO VERTEBRAL SEL VERTEBRAL UNI R MOD SED  09/26/2020   IR IVC FILTER PLMT / S&I /IMG GUID/MOD SED  10/08/2020   IR IVUS EACH ADDITIONAL NON CORONARY VESSEL  11/02/2020   IR PTA VENOUS EXCEPT DIALYSIS CIRCUIT  11/02/2020   IR THROMBECT VENO MECH MOD SED  10/27/2020   IR THROMBECT VENO MECH MOD SED  11/02/2020   IR US GUIDE VASC ACCESS LEFT  10/27/2020   IR US GUIDE VASC ACCESS RIGHT  09/26/2020   IR US GUIDE VASC ACCESS RIGHT  11/02/2020   IR  VENO/EXT/UNI RIGHT  11/02/2020   IR VENOCAVAGRAM IVC  10/27/2020   RETINAL DETACHMENT SURGERY     right knee arthroscopy     TEE WITHOUT CARDIOVERSION N/A 09/27/2013   Procedure: TRANSESOPHAGEAL ECHOCARDIOGRAM (TEE);  Surgeon: Lewayne Bunting, MD;  Location: [facility] ENDOSCOPY;  Service: Cardiovascular;  Laterality: N/A;   WRIST SURGERY      There were no vitals filed for this visit.   Subjective Assessment - 01/16/21 1551     Subjective (pt, re: his physiatrist appt) "He's not an overly optimistic fella. He said this was going to be all I was going to get back from my stroke."    Currently in Pain? No/denies                   ADULT SLP TREATMENT - 01/16/21 1551       General Information   Behavior/Cognition Alert;Cooperative;Pleasant mood;Requires cueing      Treatment Provided   Treatment provided Cognitive-Linquistic      Cognitive-Linquistic Treatment   Treatment focused on Dysarthria;Cognition;Patient/family/caregiver education    Skilled Treatment SLP probed pt a little about his "s" statement. He stated Dr. Wynn Banker said that his status now was going to be all he was going to have return. SLP read Dr. Wynn Banker'  note to pt and Walter [name], and explained that pts can make progress after 6 months after CVA it is just slower progress, as Dr. Wynn Banker stated in his note. Pt endorsed he was being more negative than necessary. SLP worked with pt on "strong" voice wiht a breath prior. He req'd usual mod-max cues initially- faded to min-mod cues occasionally. Pt had not been completing his everyday sentences BID and left them at home. SLP printed off another copy for pt and he said he could do them when he was in the car. Pt requested to take mask off after doing so and SLP asked him to leave mask donned - told pt to remind SLP at 1130 and we could take therapy outside for pt to talk strongly enough to talk over [place] traffic. Pt alerted SLP at 1131 and pt spoke audibly over mod traffic  noise 65% of the time with careful listening by SLP.      Assessment / Recommendations / Plan   Plan Continue with current plan of care      Progression Toward Goals   Progression toward goals Progressing toward goals              SLP Education - 01/16/21 1604     Education Details need to do HEP BID    Person(s) Educated Patient    Methods Explanation    Comprehension Verbalized understanding;Need further instruction              SLP Short Term Goals - 01/16/21 1606       SLP SHORT TERM GOAL #1   Title Pt will complete dysarthria HEP with occasional min A over 2 sessions    Time 2    Period Weeks    Status On-going      SLP SHORT TERM GOAL #2   Title Pt will produce loud /a/ or "hey!" with at least low 90s dB average over 2 sessions    Time 2    Period Weeks    Status On-going      SLP SHORT TERM GOAL #3   Title Pt will generate abdominal breathing in 16/20 answers to SLP questions over 2 sessions    Time 2    Period Weeks    Status On-going      SLP SHORT TERM GOAL #4   Title Pt will demonstrate dysarthria compensations in 10 minute simple to mod complex conversation to be 75% intelligibile with occasional min A over 2 sessions    Time 2    Period Weeks    Status On-going      SLP SHORT TERM GOAL #5   Title Pt will complete formal cognitive linguistic assessment in first 1-2 ST sessions    Status Achieved      SLP SHORT TERM GOAL #6   Title Pt will use memory and attention compensations for appointments, medicine management, and other daily activities with occasional min A over 2 sessions    Time 2    Period Weeks    Status On-going              SLP Long Term Goals - 01/16/21 1606       SLP LONG TERM GOAL #1   Title Pt will complete dysarthria HEP with rare min A over 2 sessions    Time 6    Period Weeks    Status On-going      SLP LONG TERM GOAL #2   Title Pt will demonstrate dysarthria  compensations in 15+ minute simple to mod  complex conversation to be 90% intelligibile with rare min A over 2 sessions    Time 6    Period Weeks    Status On-going      SLP LONG TERM GOAL #3   Title Pt will demo improved sustained and selective attention during therapeutic tasks with less than 3 cues for active listening during 2 sessions    Time 6    Period Weeks    Status On-going      SLP LONG TERM GOAL #4   Title Pt will demonstrate emergent and anticipatory awareness of impulsivity with less than 5 cues to modify behaviors during 2 sessions    Time 6    Period Weeks    Status On-going      SLP LONG TERM GOAL #5   Title Pt will report reduced frustration and improved communication effectiveness via PROM by 2 points at last ST sessions    Baseline CES: 15.5 & V-RQOL:35    Time 6    Period Weeks    Status On-going              Plan - 01/16/21 1605     Clinical Impression Statement "Walter [name]" presents for OPST intervention secondary to stroke in March 2022. SLP continued education and training pt in breath support, and audible, intelligible speech. Usual fading to occasional verbal and visual cues needed to optimize breath support and maximize vocal intensity and clarity. Skilled ST is warranted to address dysarthria and cognition to maximize communication effectiveness and optimize functional independence and safety at home.    Speech Therapy Frequency 2x / week    Duration 8 weeks    Treatment/Interventions Cueing hierarchy;Functional tasks;Patient/family education;Cognitive reorganization;Multimodal communcation approach;Language facilitation;Compensatory techniques;Internal/external aids;SLP instruction and feedback    Potential to Achieve Goals Fair    SLP Home Exercise Plan provided    Consulted and Agree with Plan of Care Patient             Patient will benefit from skilled therapeutic intervention in order to improve the following deficits and impairments:   Dysarthria and anarthria  Cognitive  communication deficit    Problem List Patient Active Problem List   Diagnosis Date Noted   Thrombosis    Sleep disturbance    Dysphagia, post-stroke    PAF (paroxysmal atrial fibrillation) (HCC)    Acute pulmonary embolism without acute cor pulmonale (HCC)    Malnutrition of moderate degree 11/04/2020   Tracheostomy care (HCC)    Pressure injury of skin 10/23/2020   Intracranial hemorrhage (HCC)    Atrial fibrillation (HCC)    Prediabetes    Leukocytosis    Acute blood loss anemia    Hypernatremia    ICH (intracerebral hemorrhage) (HCC) 09/25/2020   Unilateral primary osteoarthritis, left knee 09/26/2016   Atrial flutter (HCC) 09/25/2013   Near syncope 09/25/2013    [facility] ,MS, CCC-SLP  01/16/2021, 4:07 PM  [facility] [address] [place], [place], [address] Phone: [phone]   Fax:  [phone]   Name: Walter [name] MRN: [id number] Date of Birth: 1956/06/28

## 2021-01-16 NOTE — Therapy (Addendum)
[facility] [address] [place], [place], [address] Phone: [phone]   Fax:  [phone]  Occupational Therapy Treatment  Patient Details  Name: Walter [name] MRN: [id number] Date of Birth: 1955-12-21 Referring Provider (OT): Dr. Claudette Laws   Encounter Date: 01/16/2021   OT End of Session - 01/16/21 0943     Visit Number 2    Number of Visits 17    Date for OT Re-Evaluation 03/17/21   recert. sent 7/19 as delay in start of OT due to scheduling   Authorization Type Medicare Part A & B    Authorization - Visit Number 1    Authorization - Number of Visits 10    Progress Note Due on Visit 10    OT Start Time [phone]    OT Stop Time 1015    OT Time Calculation (min) 39 min    Activity Tolerance Patient tolerated treatment well    Behavior During Therapy Impulsive             Past Medical History:  Diagnosis Date   Atrial flutter (HCC)    s/p ablation   DVT (deep venous thrombosis) (HCC)    Near syncope 09/25/2013   Paroxysmal atrial fibrillation (HCC)    Stroke [facility])    initially hemorragic (not on OAC) followed by subsequent embolic stroke    Past Surgical History:  Procedure Laterality Date   A-FLUTTER ABLATION N/A 03/09/2020   Procedure: A-FLUTTER ABLATION;  Surgeon: Hillis Range, MD;  Location: [facility] INVASIVE CV LAB;  Service: Cardiovascular;  Laterality: N/A;   CARDIOVERSION N/A 09/27/2013   Procedure: CARDIOVERSION;  Surgeon: Lewayne Bunting, MD;  Location: [facility] ENDOSCOPY;  Service: Cardiovascular;  Laterality: N/A;   IR ANGIO EXTERNAL CAROTID SEL EXT CAROTID UNI R MOD SED  09/26/2020   IR ANGIO INTRA EXTRACRAN SEL COM CAROTID INNOMINATE BILAT MOD SED  09/26/2020   IR ANGIO VERTEBRAL SEL VERTEBRAL UNI R MOD SED  09/26/2020   IR IVC FILTER PLMT / S&I /IMG GUID/MOD SED  10/08/2020   IR IVUS EACH ADDITIONAL NON CORONARY VESSEL  11/02/2020   IR PTA VENOUS EXCEPT DIALYSIS CIRCUIT  11/02/2020   IR THROMBECT  VENO MECH MOD SED  10/27/2020   IR THROMBECT VENO MECH MOD SED  11/02/2020   IR US GUIDE VASC ACCESS LEFT  10/27/2020   IR US GUIDE VASC ACCESS RIGHT  09/26/2020   IR US GUIDE VASC ACCESS RIGHT  11/02/2020   IR VENO/EXT/UNI RIGHT  11/02/2020   IR VENOCAVAGRAM IVC  10/27/2020   RETINAL DETACHMENT SURGERY     right knee arthroscopy     TEE WITHOUT CARDIOVERSION N/A 09/27/2013   Procedure: TRANSESOPHAGEAL ECHOCARDIOGRAM (TEE);  Surgeon: Lewayne Bunting, MD;  Location: [facility] ENDOSCOPY;  Service: Cardiovascular;  Laterality: N/A;   WRIST SURGERY      There were no vitals filed for this visit.   Subjective Assessment - 01/16/21 0933     Subjective  pt denies pain and reports no falls.  Trying to get in to see neuroophthalmologist.    Pertinent History ICH of brainstem (complicated by hydrocephalus, DVTs, and tracheostomy).  PMH:  a-fib    Limitations fall risk, ataxia, impulsivity    Patient Stated Goals use L side of body better (decrease ataxia), stay active and heal brain    Currently in Pain? No/denies                [facility] OT Assessment - 01/16/21 0001  Coordination   9 Hole Peg Test Right;Left    Right 9 Hole Peg Test 53.66    Left 9 Hole Peg Test 47sec                      OT Education - 01/16/21 1013     Education Details HEP for LUE coordination/control and functional use--see pt instructions    Person(s) Educated Patient    Methods Explanation;Demonstration;Verbal cues;Handout    Comprehension Returned demonstration;Verbalized understanding;Verbal cues required              OT Short Term Goals - 01/16/21 1518       OT SHORT TERM GOAL #1   Title Pt/caregiver will be independent with initial HEP for LUE coordination and R hand strength.--check STGs 02/15/21    Time 4    Period Weeks    Status New      OT SHORT TERM GOAL #2   Title Pt/caregiver will be independent with vision HEP and visual compensation strategies.    Time 4    Period Weeks     Status New      OT SHORT TERM GOAL #3   Title Pt will improve LUE coordination for ADLs as shown by improving score on box and blocks test by at least 6.    Baseline L-21 blocks    Time 4    Period Weeks    Status New      OT SHORT TERM GOAL #4   Title Pt will perform simple snack prep/home maintenance task from w/c level mod I.    Time 4    Period Weeks    Status New      OT SHORT TERM GOAL #5   Title Pt will verbalize understanding of compensation strategies for sensory deficts for incr safety.    Time 4    Period Weeks    Status New      OT SHORT TERM GOAL #6   Title Pt will improve R hand grip stength to at least 32lbs to assist with ADLs/opening containers.    Time 4    Period Weeks    Status New               OT Long Term Goals - 01/16/21 1009       OT LONG TERM GOAL #1   Title Pt will be independent with updated HEP--check LTGs 03/18/21    Time 8    Period Weeks    Status New      OT LONG TERM GOAL #2   Title Pt will improve L hand coordination for ADLs as shown by completing 9-hole peg test in less than 70sec.    Baseline 107sec    Time 8    Period Weeks    Status New      OT LONG TERM GOAL #3   Title Pt will be mod I with toileting.    Time 8    Period Weeks    Status New      OT LONG TERM GOAL #4   Title Pt will perform simple snack prep/home maintenance tasks in standing with supervision.    Time 8    Period Weeks    Status New      OT LONG TERM GOAL #5   Title Pt will demo good safety awareness/compensation for sensory deficits and attention to LUE during functional tasks and transfers without cueing.    Time 8  Period Weeks    Status New      OT LONG TERM GOAL #6   Title Pt will perform simple environmental scanning/navigation with at least 90% accuracy for incr safety.    Time 8    Period Weeks    Status New                   Plan - 01/16/21 0938     Clinical Impression Statement Pt is progressing towards goals  with improving UE control.  However, will recert due to delayed start of OT POC (due to schedule conflicts).    OT Occupational Profile and History Detailed Assessment- Review of Records and additional review of physical, cognitive, psychosocial history related to current functional performance    Occupational performance deficits (Please refer to evaluation for details): ADL's;IADL's;Work;Leisure;Social Participation    Body Structure / Function / Physical Skills ADL;Strength;Balance;Proprioception;UE functional use;IADL;Endurance;Vision;Mobility;Coordination;Decreased knowledge of precautions;FMC;GMC;Sensation    Cognitive Skills Attention;Safety Awareness;Memory;Perception;Problem Solve    Rehab Potential Good    Clinical Decision Making Several treatment options, min-mod task modification necessary    Comorbidities Affecting Occupational Performance: May have comorbidities impacting occupational performance    Modification or Assistance to Complete Evaluation  Min-Moderate modification of tasks or assist with assess necessary to complete eval    OT Frequency 2x / week    OT Duration 8 weeks   +eval   OT Treatment/Interventions Self-care/ADL training;Moist Heat;Fluidtherapy;DME and/or AE instruction;Balance training;Therapeutic activities;Aquatic Therapy;Ultrasound;Therapeutic exercise;Cognitive remediation/compensation;Visual/perceptual remediation/compensation;Functional Mobility Training;Neuromuscular education;Cryotherapy;Energy conservation;Manual Therapy;Patient/family education    Plan diplopia/vision HEP and compensation strategies    Consulted and Agree with Plan of Care Family member/caregiver;Patient    Family Member Consulted wife             Patient will benefit from skilled therapeutic intervention in order to improve the following deficits and impairments:   Body Structure / Function / Physical Skills: ADL, Strength, Balance, Proprioception, UE functional use, IADL,  Endurance, Vision, Mobility, Coordination, Decreased knowledge of precautions, FMC, GMC, Sensation Cognitive Skills: Attention, Safety Awareness, Memory, Perception, Problem Solve     Visit Diagnosis: Other lack of coordination - Plan: Ot plan of care cert/re-cert  Muscle weakness (generalized) - Plan: Ot plan of care cert/re-cert  Other disturbances of skin sensation - Plan: Ot plan of care cert/re-cert  Other symptoms and signs involving cognitive functions following other nontraumatic intracranial hemorrhage - Plan: Ot plan of care cert/re-cert  Frontal lobe and executive function deficit - Plan: Ot plan of care cert/re-cert  Visuospatial deficit - Plan: Ot plan of care cert/re-cert  Ataxia - Plan: Ot plan of care cert/re-cert    Problem List Patient Active Problem List   Diagnosis Date Noted   Thrombosis    Sleep disturbance    Dysphagia, post-stroke    PAF (paroxysmal atrial fibrillation) (HCC)    Acute pulmonary embolism without acute cor pulmonale (HCC)    Malnutrition of moderate degree 11/04/2020   Tracheostomy care (HCC)    Pressure injury of skin 10/23/2020   Intracranial hemorrhage (HCC)    Atrial fibrillation (HCC)    Prediabetes    Leukocytosis    Acute blood loss anemia    Hypernatremia    ICH (intracerebral hemorrhage) (HCC) 09/25/2020   Unilateral primary osteoarthritis, left knee 09/26/2016   Atrial flutter (HCC) 09/25/2013   Near syncope 09/25/2013    [facility] 01/16/2021, 3:18 PM  [facility] [address] [place], [place], [address] Phone: [phone]   Fax:  [phone]  Name: Walter [name] MRN: [id number] Date of Birth: 06-Aug-1955  Willa Frater, OTR/L [facility] [address] [place], [place]  [address] [phone] phone [phone] 01/16/21 3:18 PM

## 2021-01-16 NOTE — Therapy (Signed)
OUTPATIENT PHYSICAL THERAPY TREATMENT NOTE   Patient Name: Walter [name] MRN: [id number] DOB:1956-04-14, 65 y.o., male Today's Date: 01/16/2021  PCP: Clovis Riley, L.August Saucer, MD REFERRING PROVIDER: Erick Colace, MD   PT End of Session - 01/16/21 1014     Visit Number 4    Number of Visits 17    Date for PT Re-Evaluation 02/20/21    Authorization Type Medicare 12/26/20 to 02/20/21    Progress Note Due on Visit 10    PT Start Time 1015    PT Stop Time 1100    PT Time Calculation (min) 45 min    Equipment Utilized During Treatment Gait belt    Activity Tolerance Patient tolerated treatment well    Behavior During Therapy Impulsive             Past Medical History:  Diagnosis Date   Atrial flutter (HCC)    s/p ablation   DVT (deep venous thrombosis) (HCC)    Near syncope 09/25/2013   Paroxysmal atrial fibrillation (HCC)    Stroke [facility])    initially hemorragic (not on OAC) followed by subsequent embolic stroke   Past Surgical History:  Procedure Laterality Date   A-FLUTTER ABLATION N/A 03/09/2020   Procedure: A-FLUTTER ABLATION;  Surgeon: Hillis Range, MD;  Location: [facility] INVASIVE CV LAB;  Service: Cardiovascular;  Laterality: N/A;   CARDIOVERSION N/A 09/27/2013   Procedure: CARDIOVERSION;  Surgeon: Lewayne Bunting, MD;  Location: [facility] ENDOSCOPY;  Service: Cardiovascular;  Laterality: N/A;   IR ANGIO EXTERNAL CAROTID SEL EXT CAROTID UNI R MOD SED  09/26/2020   IR ANGIO INTRA EXTRACRAN SEL COM CAROTID INNOMINATE BILAT MOD SED  09/26/2020   IR ANGIO VERTEBRAL SEL VERTEBRAL UNI R MOD SED  09/26/2020   IR IVC FILTER PLMT / S&I /IMG GUID/MOD SED  10/08/2020   IR IVUS EACH ADDITIONAL NON CORONARY VESSEL  11/02/2020   IR PTA VENOUS EXCEPT DIALYSIS CIRCUIT  11/02/2020   IR THROMBECT VENO MECH MOD SED  10/27/2020   IR THROMBECT VENO MECH MOD SED  11/02/2020   IR US GUIDE VASC ACCESS LEFT  10/27/2020   IR US GUIDE VASC ACCESS RIGHT  09/26/2020   IR US GUIDE VASC ACCESS RIGHT  11/02/2020   IR  VENO/EXT/UNI RIGHT  11/02/2020   IR VENOCAVAGRAM IVC  10/27/2020   RETINAL DETACHMENT SURGERY     right knee arthroscopy     TEE WITHOUT CARDIOVERSION N/A 09/27/2013   Procedure: TRANSESOPHAGEAL ECHOCARDIOGRAM (TEE);  Surgeon: Lewayne Bunting, MD;  Location: [facility] ENDOSCOPY;  Service: Cardiovascular;  Laterality: N/A;   WRIST SURGERY     Patient Active Problem List   Diagnosis Date Noted   Thrombosis    Sleep disturbance    Dysphagia, post-stroke    PAF (paroxysmal atrial fibrillation) (HCC)    Acute pulmonary embolism without acute cor pulmonale (HCC)    Malnutrition of moderate degree 11/04/2020   Tracheostomy care (HCC)    Pressure injury of skin 10/23/2020   Intracranial hemorrhage (HCC)    Atrial fibrillation (HCC)    Prediabetes    Leukocytosis    Acute blood loss anemia    Hypernatremia    ICH (intracerebral hemorrhage) (HCC) 09/25/2020   Unilateral primary osteoarthritis, left knee 09/26/2016   Atrial flutter (HCC) 09/25/2013   Near syncope 09/25/2013    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 Decreased, improved ventricle size since 10/06/2020 with stable small volume of IVH. 2. Hematoma and edema along the right superior approach EVD tract is stable, including a small volume of bilateral SAH. 3. Expected evolution of the left midbrain/thalamic hemorrhage and edema. 4. Small additional scattered subacute infarcts remain largely occult by CT. 5. No new intracranial abnormality identified.     IMPRESSION: 1. Similar appearance of intraventricular hemorrhage as well as hemorrhage along prior EVD tract in the right frontal lobe and scattered cortical subarachnoid hemorrhage. 2. Ventricular size remains stable. 3. No  significant change in size of intraparenchymal hemorrhage centered within the left side of the midbrain extending into the thalamic pulvinar and superior cerebellar peduncle. 4. A few small foci of restricted diffusion within the left frontal and parietal lobes, corresponding to small acute/subacute infarcts  PRECAUTIONS: fall  SUBJECTIVE: No falls to report, feels he is sleeping better through the night and people tell him he is moving better but he is no so sure  Objective: See treatment notes   TODAY'S TREATMENT:   01/16/21  Latissimus press 15x  Seated FAQs 2# alt 15x w/lat press  Seated marching 2# alt 15x w/lat press  Seated heel slides using towel 15x per side  Seated core exercises of hip and shoulder tosses, chops and Vs with 2.2# ball, 10x ea.   Seated OH lift with inspiration using 2.2# ball, 10x   Seated forward flexion with HHA from PT 10x  5xSTS with CGA diminishing to SBA  Lateral reaches over red physioball 10x ea.   01/08/21  Supine bridge, alternating marching, hip fallouts, alternating UE F/E all 2x15  Seated LAQs, marching, 2x15 performed with lat press    01/03/21 BERG score 11/56, 5x STS 35.9s with CGA Balance training in //bars, fwd and sidestep 10x per LE, marching alt pattern and heel/toe raises Stepping tasks fwd using 4" block, 10x per LE with fwd WS in // bars Latissimus press with deep inspiration 10 reps  Tapping floor targets, single target in center 10x alt in // bars 2 targets lateral of center, 10x alt in //bars    PATIENT EDUCATION: Education details: sit to stand instructions for safety Person educated: Patient and Spouse Education method: Explanation, Demonstration, and Verbal cues Education comprehension: verbalized understanding     HOME EXERCISE PROGRAM: Sit to stand  ASSESSMENT:   CLINICAL IMPRESSION: Todays session focused on seated LE strength and alternating movements, 2# weights added to ankles for proprioceptive benefit.   Introduced core strengthening for improved sitting balance and stability, continued to focus on alternating movement patterns.  Less assist needed for STS tasks but continued CGA needed to pivot  REHAB POTENTIAL: Good   CLINICAL DECISION MAKING: Stable/uncomplicated GOALS: Goals reviewed with patient? Yes   SHORT TERM GOALS:   STG Name Target Date Goal status  1 Patient will be able to perform chair to chair transfer with CGA/SBA with use of RW for safety to improve independence. Baseline: min to mod A (12/26/20) 01/23/2021 01/03/21 MinA/CGA to transfer in/out of WC  2 Pt will be able to ambulate 115' with RW with CGA to min A with RW for safety to improve independence Baseline: min to mod A with RW 145' 01/23/2021 INITIAL  3 Pt will be able to maintain standing balance for 30 sec to improve static balance Baseline: 10 sec before needing help due to LOB (12/26/20) 01/23/2021 01/03/21 Able to stand 2 min with  CGA    LONG TERM GOALS:   LTG Name Target Date Goal status  1 Pt will demo >5 points improvement on Berg Balance Scale to improve static balance Baseline: 11/56 01/03/21 02/20/2021 INITIAL  2 Pt will be able to ambulate >500 feet with RW and SBA to improve walking endurance and safety. Baseline: 145' with RW min to mod A 02/20/2021 INITIAL  3 Pt will be able to perform gait speed improvement by 0.48m/s to improve community ambulation. Baseline:TBD 02/20/2021 INITIAL  4 Pt will demo 5 sec improvement in 5x sit to stand (with or without UE support) to improve functional strength Baseline: TBD 02/20/2021 INITIAL    PLAN: PT FREQUENCY: 2x/week   PT DURATION: 8 weeks   PLANNED INTERVENTIONS: Therapeutic exercises, Therapeutic activity, Neuro Muscular re-education, Balance training, Gait training, Patient/Family education, Joint mobilization, Stair training, Visual/preceptual remediation/compensation, Wheelchair mobility training, Cryotherapy, Moist heat, and Manual therapy   PLAN FOR NEXT  SESSION: Standing tasks in // bars, floor targets and stepping tasks, Scifit?, STS and pivots    Hildred Laser PT 01/16/2021, 10:16 AM    [facility] [address] [place], [place], [address] Phone: [phone]   Fax:  [phone]  Patient name: Walter [name] MRN: [id number] DOB: Jan 16, 1956

## 2021-01-17 ENCOUNTER — Other Ambulatory Visit: Payer: Medicare Other

## 2021-01-17 ENCOUNTER — Ambulatory Visit
Admission: RE | Admit: 2021-01-17 | Discharge: 2021-01-17 | Disposition: A | Discharge status: Home / Self Care | Payer: Medicare Other | Source: Ambulatory Visit | Attending: Student | Admitting: Student

## 2021-01-17 ENCOUNTER — Ambulatory Visit
Admission: RE | Admit: 2021-01-17 | Discharge: 2021-01-17 | Disposition: A | Discharge status: Home / Self Care | Payer: Medicare Other | Source: Ambulatory Visit | Attending: Interventional Radiology | Admitting: Interventional Radiology

## 2021-01-17 DIAGNOSIS — I8290 Acute embolism and thrombosis of unspecified vein: Secondary | ICD-10-CM

## 2021-01-17 DIAGNOSIS — Z95828 Presence of other vascular implants and grafts: Secondary | ICD-10-CM

## 2021-01-17 HISTORY — PX: IR RADIOLOGIST EVAL & MGMT: IMG5224

## 2021-01-17 NOTE — Progress Notes (Signed)
Chief Complaint: The patient is seen in follow up today s/p iliocaval thrombectomy on 11/02/20  History of present illness: 65 year old male with history of recent hemorrhagic stroke, bilateral lower extremity DVT and low-risk PE, status post IVC filter placement on 10/08/2020 with subsequent propagation of right greater than left lower extremity deep vein thrombosis with ileocaval involvement and near occlusion of the inferior vena cava. Status post suction thrombectomy on 10/27/2020 with inadequate clearance of iliocaval thrombus followed by repeat mechanical thrombectomy on 11/02/2020.   He has been on Eliquis since discharge from the hospital in late May and has tolerated well.  He continues to work with multi-modal therapies for stroke recovery.  He endorses some mild swelling in the right lower leg, but denies redness, skin changes, aching, or ulcers.  He is not yet ambulating independently, but but with assistance during therapy.  Denies shortness of breath or chest pain.     Past Medical History:  Diagnosis Date   Atrial flutter [facility])    s/p ablation   DVT (deep venous thrombosis) (HCC)    Near syncope 09/25/2013   Paroxysmal atrial fibrillation (HCC)    Stroke [facility])    initially hemorragic (not on OAC) followed by subsequent embolic stroke    Past Surgical History:  Procedure Laterality Date   A-FLUTTER ABLATION N/A 03/09/2020   Procedure: A-FLUTTER ABLATION;  Surgeon: Hillis Range, MD;  Location: [facility] INVASIVE CV LAB;  Service: Cardiovascular;  Laterality: N/A;   CARDIOVERSION N/A 09/27/2013   Procedure: CARDIOVERSION;  Surgeon: Lewayne Bunting, MD;  Location[facility] ENDOSCOPY;  Service: Cardiovascular;  Laterality: N/A;   IR ANGIO EXTERNAL CAROTID SEL EXT CAROTID UNI R MOD SED  09/26/2020   IR ANGIO INTRA EXTRACRAN SEL COM CAROTID INNOMINATE BILAT MOD SED  09/26/2020   IR ANGIO VERTEBRAL SEL VERTEBRAL UNI R MOD SED  09/26/2020   IR IVC FILTER PLMT / S&I /IMG GUID/MOD SED  10/08/2020    IR IVUS EACH ADDITIONAL NON CORONARY VESSEL  11/02/2020   IR PTA VENOUS EXCEPT DIALYSIS CIRCUIT  11/02/2020   IR THROMBECT VENO MECH MOD SED  10/27/2020   IR THROMBECT VENO MECH MOD SED  11/02/2020   IR US GUIDE VASC ACCESS LEFT  10/27/2020   IR US GUIDE VASC ACCESS RIGHT  09/26/2020   IR US GUIDE VASC ACCESS RIGHT  11/02/2020   IR VENO/EXT/UNI RIGHT  11/02/2020   IR VENOCAVAGRAM IVC  10/27/2020   RETINAL DETACHMENT SURGERY     right knee arthroscopy     TEE WITHOUT CARDIOVERSION N/A 09/27/2013   Procedure: TRANSESOPHAGEAL ECHOCARDIOGRAM (TEE);  Surgeon: Lewayne Bunting, MD;  Location: [facility] ENDOSCOPY;  Service: Cardiovascular;  Laterality: N/A;   WRIST SURGERY      Allergies: Keppra [levetiracetam] and Latex  Medications: Prior to Admission medications   Medication Sig Start Date End Date Taking? Authorizing Provider  acetaminophen (TYLENOL) 325 MG tablet Take 2 tablets (650 mg total) by mouth every 4 (four) hours as needed for mild pain or fever. 11/22/20   Angiulli, Mcarthur Rossetti, PA-C  apixaban (ELIQUIS) 5 MG TABS tablet Take 1 tablet (5 mg total) by mouth 2 (two) times daily. 11/22/20   Angiulli, Mcarthur Rossetti, PA-C  Armodafinil 150 MG tablet Take 1 tablet (150 mg total) by mouth daily. 12/22/20   Kirsteins, Victorino Sparrow, MD  cholecalciferol (VITAMIN D3) 25 MCG (1000 UNIT) tablet Take 1 tablet (1,000 Units total) by mouth daily. 11/22/20   Angiulli, Mcarthur Rossetti, PA-C  clonazepam Scarlette Calico)  0.125 MG disintegrating tablet Take 1 tablet (0.125 mg total) by mouth at bedtime as needed (sleep). 11/22/20   Angiulli, Mcarthur Rossetti, PA-C  diltiazem (CARDIZEM CD) 180 MG 24 hr capsule Take 1 capsule (180 mg total) by mouth daily. 12/27/20   Allred, Fayrene Fearing, MD  doxazosin (CARDURA) 4 MG tablet Take 4 mg by mouth daily. 12/22/20   [provider]  QUEtiapine (SEROQUEL) 25 MG tablet Take 1 tablet (25 mg total) by mouth at bedtime. 01/09/21   Kirsteins, Victorino Sparrow, MD  sertraline (ZOLOFT) 50 MG tablet Take 50 mg by mouth daily.     [provider]     Family History  Problem Relation Age of Onset   Heart disease Mother    Heart disease Father    Healthy Sister    Healthy Brother     Social History   Socioeconomic History   Marital status: Married    Spouse name: cathy   Number of children: Not on file   Years of education: Not on file   Highest education level: Not on file  Occupational History   Not on file  Tobacco Use   Smoking status: Never   Smokeless tobacco: Never  Substance and Sexual Activity   Alcohol use: Yes    Comment: occasionally   Drug use: No   Sexual activity: Not on file  Other Topics Concern   Not on file  Social History Narrative   Lives with wife at home   Right Handed   Drinks decaf   Social Determinants of Health   Financial Resource Strain: Not on file  Food Insecurity: Not on file  Transportation Needs: Not on file  Physical Activity: Not on file  Stress: Not on file  Social Connections: Not on file     Vital Signs: There were no vitals taken for this visit.  Physical Exam HENT:     Head: Normocephalic.  Cardiovascular:     Rate and Rhythm: Normal rate.  Pulmonary:     Effort: Pulmonary effort is normal.  Abdominal:     General: There is no distension.  Musculoskeletal:     Right lower leg: Edema present.     Left lower leg: No edema.  Skin:    General: Skin is warm and dry.  Neurological:     Mental Status: He is alert and oriented to person, place, and time.     Imaging: CTV Abdomen/pelvis 10/26/20  Extensive bilateral iliocaval thrombus extending to IVC filter.   IR thrombectomy (11/02/20): Pre    Post    CTV Abdomen/pelvis 01/15/21  Complete resolution of iliocaval thrombus   DVT US (01/17/21): Right:  chronic non-occlusive thrombus within the right femoral, popliteal, and calf veins.  Occlusive chronic thrombus within the right posterior tibial vein. Left:  chronic non-occlusive thrombus in the distal left femoral  vein   No new pertinent labs.   Assessment and Plan: 65 year old male with history of recent hemorrhagic stroke status post IVC filter placement on 10/08/2020 with subsequent propagation of right greater than left lower extremity deep vein thrombosis with ileocaval involvement and near occlusion of the inferior vena cava. Status post suction thrombectomy on 10/27/2020 with inadequate clearance of iliocaval thrombus followed by repeat mechanical thrombectomy on 11/02/2020.   Excellent resolution of iliocaval thrombus and right femoral DVT on recent imaging.  Tolerating Eliquis well.  Overall recovering well from VTE standpoint with minimal swelling of the right lower extremity secondary to residual, mostly non-occlusive thrombus extending from  calf veins through the femoral vein.   -follow up in 3 months with repeat lower extremity DVT ultrasound, will consider filter retrieval at this point if thrombus burden improved -agree with continuing Eliquis -recommend right thigh-high compression stocking use during waking hours as tolerated     Electronically Signed: Thressa Sheller Ulises Wolfinger 01/17/2021, 2:01 PM   I spent a total of 40 Minutes in face to face in clinical consultation, greater than 50% of which was counseling/coordinating care for venous thromboembolism.

## 2021-01-18 ENCOUNTER — Ambulatory Visit: Payer: Medicare Other | Admitting: Physical Therapy

## 2021-01-18 ENCOUNTER — Ambulatory Visit: Payer: Medicare Other | Admitting: Occupational Therapy

## 2021-01-18 ENCOUNTER — Ambulatory Visit: Payer: Medicare Other

## 2021-01-18 ENCOUNTER — Encounter: Payer: Self-pay | Admitting: *Deleted

## 2021-01-18 ENCOUNTER — Other Ambulatory Visit: Payer: Self-pay

## 2021-01-18 DIAGNOSIS — R270 Ataxia, unspecified: Secondary | ICD-10-CM

## 2021-01-18 DIAGNOSIS — M6281 Muscle weakness (generalized): Secondary | ICD-10-CM

## 2021-01-18 DIAGNOSIS — I69218 Other symptoms and signs involving cognitive functions following other nontraumatic intracranial hemorrhage: Secondary | ICD-10-CM

## 2021-01-18 DIAGNOSIS — R278 Other lack of coordination: Secondary | ICD-10-CM

## 2021-01-18 DIAGNOSIS — R41841 Cognitive communication deficit: Secondary | ICD-10-CM

## 2021-01-18 DIAGNOSIS — R41844 Frontal lobe and executive function deficit: Secondary | ICD-10-CM

## 2021-01-18 DIAGNOSIS — R41842 Visuospatial deficit: Secondary | ICD-10-CM

## 2021-01-18 DIAGNOSIS — R208 Other disturbances of skin sensation: Secondary | ICD-10-CM

## 2021-01-18 DIAGNOSIS — R471 Dysarthria and anarthria: Secondary | ICD-10-CM

## 2021-01-18 DIAGNOSIS — R260 Ataxic gait: Secondary | ICD-10-CM

## 2021-01-18 DIAGNOSIS — R2681 Unsteadiness on feet: Secondary | ICD-10-CM

## 2021-01-18 NOTE — Therapy (Signed)
[facility] [address] [place], [place], [address] Phone: [phone]   Fax:  [phone]  Occupational Therapy Treatment  Patient Details  Name: Walter [name] MRN: [id number] Date of Birth: March 22, 1956 Referring Provider (OT): Dr. Claudette Laws   Encounter Date: 01/18/2021   OT End of Session - 01/18/21 1418     Visit Number 3    Number of Visits 17    Date for OT Re-Evaluation 03/17/21    Authorization Type Medicare Part A & B    Authorization - Visit Number 3    Authorization - Number of Visits 10    OT Start Time 1405    OT Stop Time 1445    OT Time Calculation (min) 40 min             Past Medical History:  Diagnosis Date   Atrial flutter (HCC)    s/p ablation   DVT (deep venous thrombosis) (HCC)    Near syncope 09/25/2013   Paroxysmal atrial fibrillation (HCC)    Stroke [facility])    initially hemorragic (not on [facility]) followed by subsequent embolic stroke    Past Surgical History:  Procedure Laterality Date   A-FLUTTER ABLATION N/A 03/09/2020   Procedure: A-FLUTTER ABLATION;  Surgeon: Hillis Range, MD;  Location: [facility] INVASIVE CV LAB;  Service: Cardiovascular;  Laterality: N/A;   CARDIOVERSION N/A 09/27/2013   Procedure: CARDIOVERSION;  Surgeon: Lewayne Bunting, MD;  Location: [facility] ENDOSCOPY;  Service: Cardiovascular;  Laterality: N/A;   IR ANGIO EXTERNAL CAROTID SEL EXT CAROTID UNI R MOD SED  09/26/2020   IR ANGIO INTRA EXTRACRAN SEL COM CAROTID INNOMINATE BILAT MOD SED  09/26/2020   IR ANGIO VERTEBRAL SEL VERTEBRAL UNI R MOD SED  09/26/2020   IR IVC FILTER PLMT / S&I /IMG GUID/MOD SED  10/08/2020   IR IVUS EACH ADDITIONAL NON CORONARY VESSEL  11/02/2020   IR PTA VENOUS EXCEPT DIALYSIS CIRCUIT  11/02/2020   IR RADIOLOGIST EVAL & MGMT  01/17/2021   IR THROMBECT VENO MECH MOD SED  10/27/2020   IR THROMBECT VENO MECH MOD SED  11/02/2020   IR US GUIDE VASC ACCESS LEFT  10/27/2020   IR US GUIDE VASC ACCESS RIGHT   09/26/2020   IR US GUIDE VASC ACCESS RIGHT  11/02/2020   IR VENO/EXT/UNI RIGHT  11/02/2020   IR VENOCAVAGRAM IVC  10/27/2020   RETINAL DETACHMENT SURGERY     right knee arthroscopy     TEE WITHOUT CARDIOVERSION N/A 09/27/2013   Procedure: TRANSESOPHAGEAL ECHOCARDIOGRAM (TEE);  Surgeon: Lewayne Bunting, MD;  Location: [facility] ENDOSCOPY;  Service: Cardiovascular;  Laterality: N/A;   WRIST SURGERY      There were no vitals filed for this visit.   Subjective Assessment - 01/18/21 1418     Subjective  Denies pain    Pertinent History ICH of brainstem (complicated by hydrocephalus, DVTs, and tracheostomy).  PMH:  a-fib    Limitations fall risk, ataxia, impulsivity    Currently in Pain? No/denies                    Treatment: Placing and removing large to small pegs from semicircle with left UE for increased coordination, min difficulty/ v.c  Stacking pennies with LUE for improved fine motor coordination, mod  difficulty/ v.c for performance              OT Education - 01/18/21 1433     Education Details HEP for diplopia, basic  progress if performance improves, pt's wife was verbally instructed before she left therapy session - see pt instructions   Person(s) Educated Patient    Methods Explanation;Demonstration;Verbal cues;Handout;Tactile cues    Comprehension Returned demonstration;Verbalized understanding;Verbal cues required;Need further instruction              OT Short Term Goals - 01/16/21 1518       OT SHORT TERM GOAL #1   Title Pt/caregiver will be independent with initial HEP for LUE coordination and R hand strength.--check STGs 02/15/21    Time 4    Period Weeks    Status New      OT SHORT TERM GOAL #2   Title Pt/caregiver will be independent with vision HEP and visual compensation strategies.    Time 4    Period Weeks    Status New      OT SHORT TERM GOAL #3   Title Pt will improve LUE coordination for ADLs as shown by improving score on box and  blocks test by at least 6.    Baseline L-21 blocks    Time 4    Period Weeks    Status New      OT SHORT TERM GOAL #4   Title Pt will perform simple snack prep/home maintenance task from w/c level mod I.    Time 4    Period Weeks    Status New      OT SHORT TERM GOAL #5   Title Pt will verbalize understanding of compensation strategies for sensory deficts for incr safety.    Time 4    Period Weeks    Status New      OT SHORT TERM GOAL #6   Title Pt will improve R hand grip stength to at least 32lbs to assist with ADLs/opening containers.    Time 4    Period Weeks    Status New               OT Long Term Goals - 01/16/21 1009       OT LONG TERM GOAL #1   Title Pt will be independent with updated HEP--check LTGs 03/18/21    Time 8    Period Weeks    Status New      OT LONG TERM GOAL #2   Title Pt will improve L hand coordination for ADLs as shown by completing 9-hole peg test in less than 70sec.    Baseline 107sec    Time 8    Period Weeks    Status New      OT LONG TERM GOAL #3   Title Pt will be mod I with toileting.    Time 8    Period Weeks    Status New      OT LONG TERM GOAL #4   Title Pt will perform simple snack prep/home maintenance tasks in standing with supervision.    Time 8    Period Weeks    Status New      OT LONG TERM GOAL #5   Title Pt will demo good safety awareness/compensation for sensory deficits and attention to LUE during functional tasks and transfers without cueing.    Time 8    Period Weeks    Status New      OT LONG TERM GOAL #6   Title Pt will perform simple environmental scanning/navigation with at least 90% accuracy for incr safety.    Time 8    Period Weeks  Status New                   Plan - 01/18/21 1428     Clinical Impression Statement Pt is progressing slowly towards goals limited by visual, and cognitive deficits    OT Occupational Profile and History Detailed Assessment- Review of Records and  additional review of physical, cognitive, psychosocial history related to current functional performance    Occupational performance deficits (Please refer to evaluation for details): ADL's;IADL's;Work;Leisure;Social Participation    Body Structure / Function / Physical Skills ADL;Strength;Balance;Proprioception;UE functional use;IADL;Endurance;Vision;Mobility;Coordination;Decreased knowledge of precautions;FMC;GMC;Sensation    Cognitive Skills Attention;Safety Awareness;Memory;Perception;Problem Solve    Rehab Potential Good    Clinical Decision Making Several treatment options, min-mod task modification necessary    Comorbidities Affecting Occupational Performance: May have comorbidities impacting occupational performance    Modification or Assistance to Complete Evaluation  Min-Moderate modification of tasks or assist with assess necessary to complete eval    OT Frequency 2x / week    OT Duration 8 weeks   +eval   OT Treatment/Interventions Self-care/ADL training;Moist Heat;Fluidtherapy;DME and/or AE instruction;Balance training;Therapeutic activities;Aquatic Therapy;Ultrasound;Therapeutic exercise;Cognitive remediation/compensation;Visual/perceptual remediation/compensation;Functional Mobility Training;Neuromuscular education;Cryotherapy;Energy conservation;Manual Therapy;Patient/family education    Plan review vision exercises, continue to work on functional use of LUE/ NMR,    Consulted and Agree with Plan of Care Family member/caregiver;Patient    Family Member Consulted wife             Patient will benefit from skilled therapeutic intervention in order to improve the following deficits and impairments:   Body Structure / Function / Physical Skills: ADL, Strength, Balance, Proprioception, UE functional use, IADL, Endurance, Vision, Mobility, Coordination, Decreased knowledge of precautions, FMC, GMC, Sensation Cognitive Skills: Attention, Safety Awareness, Memory, Perception, Problem  Solve     Visit Diagnosis: Other lack of coordination  Muscle weakness (generalized)  Other disturbances of skin sensation  Visuospatial deficit  Frontal lobe and executive function deficit  Other symptoms and signs involving cognitive functions following other nontraumatic intracranial hemorrhage    Problem List Patient Active Problem List   Diagnosis Date Noted   Thrombosis    Sleep disturbance    Dysphagia, post-stroke    PAF (paroxysmal atrial fibrillation) (HCC)    Acute pulmonary embolism without acute cor pulmonale (HCC)    Malnutrition of moderate degree 11/04/2020   Tracheostomy care (HCC)    Pressure injury of skin 10/23/2020   Intracranial hemorrhage (HCC)    Atrial fibrillation (HCC)    Prediabetes    Leukocytosis    Acute blood loss anemia    Hypernatremia    ICH (intracerebral hemorrhage) (HCC) 09/25/2020   Unilateral primary osteoarthritis, left knee 09/26/2016   Atrial flutter (HCC) 09/25/2013   Near syncope 09/25/2013    Rebbecca Osuna 01/18/2021, 2:35 PM  [facility] [address] [place], [place], [address] Phone: [phone]   Fax:  [phone]  Name: RADIN RAPTIS MRN: [id number] Date of Birth: March 09, 1956

## 2021-01-18 NOTE — Therapy (Signed)
Date of Birth: March 02, 1956 OUTPATIENT PHYSICAL THERAPY TREATMENT NOTE   Patient Name: Walter [name] MRN: [id number] DOB:April 10, 1956, 65 y.o., male Today's Date: 01/18/2021  PCP: Clovis Riley, L.August Saucer, MD REFERRING PROVIDER: Bosie Clos, MD   PT End of Session - 01/18/21 1536     Visit Number 5    Number of Visits 17    Date for PT Re-Evaluation 02/20/21    Authorization Type Medicare 12/26/20 to 02/20/21    Progress Note Due on Visit 10    PT Start Time 1535    PT Stop Time 1615    PT Time Calculation (min) 40 min    Equipment Utilized During Treatment Gait belt    Activity Tolerance Patient tolerated treatment well    Behavior During Therapy Impulsive             Past Medical History:  Diagnosis Date   Atrial flutter (HCC)    s/p ablation   DVT (deep venous thrombosis) (HCC)    Near syncope 09/25/2013   Paroxysmal atrial fibrillation (HCC)    Stroke [facility])    initially hemorragic (not on [facility]) followed by subsequent embolic stroke   Past Surgical History:  Procedure Laterality Date   A-FLUTTER ABLATION N/A 03/09/2020   Procedure: A-FLUTTER ABLATION;  Surgeon: Hillis Range, MD;  Location: [facility] INVASIVE CV LAB;  Service: Cardiovascular;  Laterality: N/A;   CARDIOVERSION N/A 09/27/2013   Procedure: CARDIOVERSION;  Surgeon: Lewayne Bunting, MD;  Location: [facility] ENDOSCOPY;  Service: Cardiovascular;  Laterality: N/A;   IR ANGIO EXTERNAL CAROTID SEL EXT CAROTID UNI R MOD SED  09/26/2020   IR ANGIO INTRA EXTRACRAN SEL COM CAROTID INNOMINATE BILAT MOD SED  09/26/2020   IR ANGIO VERTEBRAL SEL VERTEBRAL UNI R MOD SED  09/26/2020   IR IVC FILTER PLMT / S&I /IMG GUID/MOD SED  10/08/2020   IR IVUS EACH ADDITIONAL NON CORONARY VESSEL  11/02/2020   IR PTA VENOUS EXCEPT DIALYSIS CIRCUIT  11/02/2020   IR RADIOLOGIST EVAL & MGMT  01/17/2021   IR THROMBECT VENO MECH MOD SED  10/27/2020   IR THROMBECT VENO MECH MOD SED  11/02/2020   IR US GUIDE VASC ACCESS LEFT  10/27/2020   IR US GUIDE VASC ACCESS  RIGHT  09/26/2020   IR US GUIDE VASC ACCESS RIGHT  11/02/2020   IR VENO/EXT/UNI RIGHT  11/02/2020   IR VENOCAVAGRAM IVC  10/27/2020   RETINAL DETACHMENT SURGERY     right knee arthroscopy     TEE WITHOUT CARDIOVERSION N/A 09/27/2013   Procedure: TRANSESOPHAGEAL ECHOCARDIOGRAM (TEE);  Surgeon: Lewayne Bunting, MD;  Location: [facility] ENDOSCOPY;  Service: Cardiovascular;  Laterality: N/A;   WRIST SURGERY     Patient Active Problem List   Diagnosis Date Noted   Thrombosis    Sleep disturbance    Dysphagia, post-stroke    PAF (paroxysmal atrial fibrillation) (HCC)    Acute pulmonary embolism without acute cor pulmonale (HCC)    Malnutrition of moderate degree 11/04/2020   Tracheostomy care (HCC)    Pressure injury of skin 10/23/2020   Intracranial hemorrhage (HCC)    Atrial fibrillation (HCC)    Prediabetes    Leukocytosis    Acute blood loss anemia    Hypernatremia    ICH (intracerebral hemorrhage) (HCC) 09/25/2020   Unilateral primary osteoarthritis, left knee 09/26/2016   Atrial flutter (HCC) 09/25/2013   Near syncope 09/25/2013    REFERRING DIAG: I61.3 (ICD-10-CM) - 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 (HCC) I69.193 (ICD-10-CM) - 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  PERTINENT HISTORY: DIAGNOSTIC FINDINGS: IMPRESSION: 1. Decreased, improved ventricle size since 10/06/2020 with stable small volume of IVH. 2. Hematoma and edema along the right superior approach EVD tract is stable, including a small volume of bilateral SAH. 3. Expected evolution of the left midbrain/thalamic hemorrhage and edema. 4. Small additional scattered subacute infarcts remain largely occult by CT. 5. No new intracranial abnormality identified.     IMPRESSION: 1. Similar appearance of intraventricular hemorrhage as well as hemorrhage along prior EVD tract in the right frontal lobe and scattered cortical subarachnoid hemorrhage. 2.  Ventricular size remains stable. 3. No significant change in size of intraparenchymal hemorrhage centered within the left side of the midbrain extending into the thalamic pulvinar and superior cerebellar peduncle. 4. A few small foci of restricted diffusion within the left frontal and parietal lobes, corresponding to small acute/subacute infarcts  PRECAUTIONS: fall  SUBJECTIVE: Pt states he hasn't had time to do his HEP. Pt states he's been doing adaptive yoga. Pt has movement therapy (focusing on slow movements). Pt reports walking down the steps and walked on the street with the walker.  Objective: See treatment notes  Sit<>stand CGA for safety (very fast/impulsive)  Stand pivot t/f min/CGA  Scooting/squat pivot t/f CGA   TODAY'S TREATMENT:    01/18/21 Sit<>stand x10  Seated heel slide with washrag x10  Standing heel slide forward with washrag 3x10 with targets  Standing heel slide to the side with washrag 3x10 with targets  Standing step forward and back on to targets 3x10  Standing balance with alternating UE raises 2x10  In // bars: forward stepping, pt cued to step on targets and "slide" foot to keep from L LE marching x 6 trips  Amb around gym 2x115' with tactile cues to keep from excessive L hip flexion, v/cs to "slide" L foot forward and slow down steps    01/16/21  Latissimus press 15x  Seated FAQs 2# alt 15x w/lat press  Seated marching 2# alt 15x w/lat press  Seated heel slides using towel 15x per side  Seated core exercises of hip and shoulder tosses, chops and Vs with 2.2# ball, 10x ea.   Seated OH lift with inspiration using 2.2# ball, 10x   Seated forward flexion with HHA from PT 10x  5xSTS with CGA diminishing to SBA  Lateral reaches over red physioball 10x ea.   01/08/21  Supine bridge, alternating marching, hip fallouts, alternating UE F/E all 2x15  Seated LAQs, marching, 2x15 performed with lat press    01/03/21 BERG score 11/56, 5x STS 35.9s with  CGA Balance training in //bars, fwd and sidestep 10x per LE, marching alt pattern and heel/toe raises Stepping tasks fwd using 4" block, 10x per LE with fwd WS in // bars Latissimus press with deep inspiration 10 reps  Tapping floor targets, single target in center 10x alt in // bars 2 targets lateral of center, 10x alt in //bars    PATIENT EDUCATION: Education details: sit to stand instructions for safety Person educated: Patient and Spouse Education method: Explanation, Demonstration, and Verbal cues Education comprehension: verbalized understanding     HOME EXERCISE PROGRAM: Step/tapping on targets at home  ASSESSMENT:   CLINICAL IMPRESSION: Todays session focused on standing balance, stability, and L LE coordination. Worked on ambulating with more normalized gait pattern. Pt utilizing steppage gait on L -- improved with tactile and v/cs to slide L foot forward. Continues to  require min/CGA for t/fs   REHAB POTENTIAL: Good   CLINICAL DECISION MAKING: Stable/uncomplicated GOALS: Goals reviewed with patient? Yes   SHORT TERM GOALS:   STG Name Target Date Goal status  1 Patient will be able to perform chair to chair transfer with CGA/SBA with use of RW for safety to improve independence. Baseline: min to mod A (12/26/20) 01/23/2021 01/03/21 MinA/CGA to transfer in/out of WC  2 Pt will be able to ambulate 115' with RW with CGA to min A with RW for safety to improve independence Baseline: min to mod A with RW 145' 01/23/2021 INITIAL  3 Pt will be able to maintain standing balance for 30 sec to improve static balance Baseline: 10 sec before needing help due to LOB (12/26/20) 01/23/2021 01/03/21 Able to stand 2 min with CGA    LONG TERM GOALS:   LTG Name Target Date Goal status  1 Pt will demo >5 points improvement on Berg Balance Scale to improve static balance Baseline: 11/56 01/03/21 02/20/2021 INITIAL  2 Pt will be able to ambulate >500 feet with RW and SBA to improve walking  endurance and safety. Baseline: 145' with RW min to mod A 02/20/2021 INITIAL  3 Pt will be able to perform gait speed improvement by 0.41m/s to improve community ambulation. Baseline:TBD 02/20/2021 INITIAL  4 Pt will demo 5 sec improvement in 5x sit to stand (with or without UE support) to improve functional strength Baseline: TBD 02/20/2021 INITIAL    PLAN: PT FREQUENCY: 2x/week   PT DURATION: 8 weeks   PLANNED INTERVENTIONS: Therapeutic exercises, Therapeutic activity, Neuro Muscular re-education, Balance training, Gait training, Patient/Family education, Joint mobilization, Stair training, Visual/preceptual remediation/compensation, Wheelchair mobility training, Cryotherapy, Moist heat, and Manual therapy   PLAN FOR NEXT SESSION: Standing tasks in // bars, floor targets and stepping tasks, Scifit?, STS and pivots     Patient will benefit from skilled therapeutic intervention in order to improve the following deficits and impairments:     Visit Diagnosis: No diagnosis found.     Problem List Patient Active Problem List   Diagnosis Date Noted   Thrombosis    Sleep disturbance    Dysphagia, post-stroke    PAF (paroxysmal atrial fibrillation) (HCC)    Acute pulmonary embolism without acute cor pulmonale (HCC)    Malnutrition of moderate degree 11/04/2020   Tracheostomy care (HCC)    Pressure injury of skin 10/23/2020   Intracranial hemorrhage (HCC)    Atrial fibrillation (HCC)    Prediabetes    Leukocytosis    Acute blood loss anemia    Hypernatremia    ICH (intracerebral hemorrhage) (HCC) 09/25/2020   Unilateral primary osteoarthritis, left knee 09/26/2016   Atrial flutter (HCC) 09/25/2013   Near syncope 09/25/2013    [facility] April Ma L Katana Berthold PT, DPT 01/18/2021, 3:44 PM  [place] [facility] [address] [place], [place], [address] Phone: [phone]   Fax:  [phone]  Name: Walter [name] MRN:  [id number] Date of Birth: 09-20-1955

## 2021-01-18 NOTE — Therapy (Signed)
[facility] [address] [place], [place], [address] Phone: [phone]   Fax:  [phone]  Speech Language Pathology Treatment  Patient Details  Name: Walter [name] MRN: [id number] Date of Birth: 1955/10/28 Referring Provider (SLP): Erick Colace, MD   Encounter Date: 01/18/2021   End of Session - 01/18/21 1414     Visit Number 6    Number of Visits 17    Date for SLP Re-Evaluation 02/21/21    Authorization Type Medicare    SLP Start Time 1445    SLP Stop Time  1530    SLP Time Calculation (min) 45 min    Activity Tolerance Patient tolerated treatment well             Past Medical History:  Diagnosis Date   Atrial flutter (HCC)    s/p ablation   DVT (deep venous thrombosis) (HCC)    Near syncope 09/25/2013   Paroxysmal atrial fibrillation (HCC)    Stroke [facility])    initially hemorragic (not on [facility]) followed by subsequent embolic stroke    Past Surgical History:  Procedure Laterality Date   A-FLUTTER ABLATION N/A 03/09/2020   Procedure: A-FLUTTER ABLATION;  Surgeon: Hillis Range, MD;  [facility] INVASIVE CV LAB;  Service: Cardiovascular;  Laterality: N/A;   CARDIOVERSION N/A 09/27/2013   Procedure: CARDIOVERSION;  Surgeon: Lewayne Bunting, MD;  Location: [facility] ENDOSCOPY;  Service: Cardiovascular;  Laterality: N/A;   IR ANGIO EXTERNAL CAROTID SEL EXT CAROTID UNI R MOD SED  09/26/2020   IR ANGIO INTRA EXTRACRAN SEL COM CAROTID INNOMINATE BILAT MOD SED  09/26/2020   IR ANGIO VERTEBRAL SEL VERTEBRAL UNI R MOD SED  09/26/2020   IR IVC FILTER PLMT / S&I /IMG GUID/MOD SED  10/08/2020   IR IVUS EACH ADDITIONAL NON CORONARY VESSEL  11/02/2020   IR PTA VENOUS EXCEPT DIALYSIS CIRCUIT  11/02/2020   IR RADIOLOGIST EVAL & MGMT  01/17/2021   IR THROMBECT VENO MECH MOD SED  10/27/2020   IR THROMBECT VENO MECH MOD SED  11/02/2020   IR US GUIDE VASC ACCESS LEFT  10/27/2020   IR US GUIDE VASC ACCESS RIGHT  09/26/2020   IR US  GUIDE VASC ACCESS RIGHT  11/02/2020   IR VENO/EXT/UNI RIGHT  11/02/2020   IR VENOCAVAGRAM IVC  10/27/2020   RETINAL DETACHMENT SURGERY     right knee arthroscopy     TEE WITHOUT CARDIOVERSION N/A 09/27/2013   Procedure: TRANSESOPHAGEAL ECHOCARDIOGRAM (TEE);  Surgeon: Lewayne Bunting, MD;  Location: [facility] ENDOSCOPY;  Service: Cardiovascular;  Laterality: N/A;   WRIST SURGERY      There were no vitals filed for this visit.   Subjective Assessment - 01/18/21 1447     Subjective 'I did fair" re: last ST appt talking outside    Currently in Pain? No/denies                   ADULT SLP TREATMENT - 01/18/21 1414       General Information   Behavior/Cognition Alert;Cooperative;Pleasant mood;Requires cueing      Treatment Provided   Treatment provided Cognitive-Linquistic      Cognitive-Linquistic Treatment   Treatment focused on Dysarthria;Cognition;Patient/family/caregiver education    Skilled Treatment Pt recalled talking outside last ST session, in which pt endorses doing "fair" during conversation outside with other SLP. Pt endoses usual presence of "phlegm" which has been an ongoing issue prior to stroke. SLP recommended pt f/u with MD re: phlegm management  as swallowing hard and liquid washes are reportedly ineffective to clear phlegm. Loud /a/ and "hey!" completed with mid 80s db given intermittent SLP modeling to reduce strain and shape voice. SLP targeted carryover of compensations for short responses to open ended questions. Pt demonstrated "strong voice" with 80% accuracy. Some impulsivity noted with pt exhibiting fluctuations in stress and intonation.      Assessment / Recommendations / Plan   Plan Continue with current plan of care      Progression Toward Goals   Progression toward goals Progressing toward goals              SLP Education - 01/18/21 1629     Education Details HEP, functional practice    Person(s) Educated Patient    Methods  Explanation;Demonstration    Comprehension Verbalized understanding;Returned demonstration;Need further instruction              SLP Short Term Goals - 01/18/21 1414       SLP SHORT TERM GOAL #1   Title Pt will complete dysarthria HEP with occasional min A over 2 sessions    Time 2    Period Weeks    Status On-going      SLP SHORT TERM GOAL #2   Title Pt will produce loud /a/ or "hey!" with at least high 80s dB average over 2 sessions    Time 2    Period Weeks    Status Revised      SLP SHORT TERM GOAL #3   Title Pt will generate abdominal breathing in 16/20 answers to SLP questions over 2 sessions    Baseline 01-18-21    Time 2    Period Weeks    Status On-going      SLP SHORT TERM GOAL #4   Title Pt will demonstrate dysarthria compensations in 10 minute simple to mod complex conversation to be 75% intelligibile with occasional min A over 2 sessions    Time 2    Period Weeks    Status On-going      SLP SHORT TERM GOAL #5   Title Pt will complete formal cognitive linguistic assessment in first 1-2 ST sessions    Status Achieved      SLP SHORT TERM GOAL #6   Title Pt will use memory and attention compensations for appointments, medicine management, and other daily activities with occasional min A over 2 sessions    Time 2    Period Weeks    Status On-going              SLP Long Term Goals - 01/18/21 1438       SLP LONG TERM GOAL #1   Title Pt will complete dysarthria HEP with rare min A over 2 sessions    Time 6    Period Weeks    Status On-going      SLP LONG TERM GOAL #2   Title Pt will demonstrate dysarthria compensations in 15+ minute simple to mod complex conversation to be 90% intelligibile with rare min A over 2 sessions    Time 6    Period Weeks    Status On-going      SLP LONG TERM GOAL #3   Title Pt will demo improved sustained and selective attention during therapeutic tasks with less than 3 cues for active listening during 2 sessions     Time 6    Period Weeks    Status On-going      SLP LONG  TERM GOAL #4   Title Pt will demonstrate emergent and anticipatory awareness of impulsivity with less than 5 cues to modify behaviors during 2 sessions    Time 6    Period Weeks    Status On-going      SLP LONG TERM GOAL #5   Title Pt will report reduced frustration and improved communication effectiveness via PROM by 2 points at last ST sessions    Baseline CES: 15.5 & V-RQOL:35    Time 6    Period Weeks    Status On-going              Plan - 01/18/21 1415     Clinical Impression Statement "Walter [name]" presents for OPST intervention secondary to stroke in March 2022. SLP continued education and training pt in breath support, and audible, intelligible speech. Occasional fading to rare verbal and visual cues needed to optimize breath support and maximize vocal intensity and clarity on structured speech tasks this session. Skilled ST is warranted to address dysarthria and cognition to maximize communication effectiveness and optimize functional independence and safety at home.    Speech Therapy Frequency 2x / week    Duration 8 weeks    Treatment/Interventions Cueing hierarchy;Functional tasks;Patient/family education;Cognitive reorganization;Multimodal communcation approach;Language facilitation;Compensatory techniques;Internal/external aids;SLP instruction and feedback    Potential to Achieve Goals Fair    SLP Home Exercise Plan provided    Consulted and Agree with Plan of Care Patient             Patient will benefit from skilled therapeutic intervention in order to improve the following deficits and impairments:   Dysarthria and anarthria  Cognitive communication deficit    Problem List Patient Active Problem List   Diagnosis Date Noted   Thrombosis    Sleep disturbance    Dysphagia, post-stroke    PAF (paroxysmal atrial fibrillation) (HCC)    Acute pulmonary embolism without acute cor pulmonale (HCC)     Malnutrition of moderate degree 11/04/2020   Tracheostomy care (HCC)    Pressure injury of skin 10/23/2020   Intracranial hemorrhage (HCC)    Atrial fibrillation (HCC)    Prediabetes    Leukocytosis    Acute blood loss anemia    Hypernatremia    ICH (intracerebral hemorrhage) (HCC) 09/25/2020   Unilateral primary osteoarthritis, left knee 09/26/2016   Atrial flutter (HCC) 09/25/2013   Near syncope 09/25/2013    Janann Colonel, [name] 01/18/2021, 4:32 PM  [facility] [address] [place], [place], [address] Phone: [phone]   Fax:  [phone]   Name: BRADY SCHILLER MRN: [id number] Date of Birth: 1955-11-24

## 2021-01-18 NOTE — Patient Instructions (Signed)
Diplopia HEP:  Perform at least 1-2 times per day. Stop if your eye becomes fatigued or hurts and try again later.  1. Hold a small object/card in front of you.  Hold it in the middle at arm's length away.    2. Cover your LEFT eye and look at the object with your RIGHT eye.  3. Slowly move the object side to side in front of you while continuing to watch it with your RIGHT eye.  4.  Remember to keep your head still and only move your eye.  5.  Repeat 5-10 times.  6.  Then, move object up and down while watching it 5-10 times.  7. Cover your RIGHT eye and look at the object with your LEFT eye while you repeat the previous steps above.  8.  Now, uncover both eyes and try to focus on the object while holding it in the middle.  Try to make it 1 image.

## 2021-01-23 ENCOUNTER — Telehealth: Payer: Self-pay | Admitting: Neurology

## 2021-01-23 NOTE — Telephone Encounter (Signed)
Dr. Karin Golden called, asking Dr. Pearlean Brownie to call back. to discuss the patient.  Would not elaborate on message to send. Contact info: [phone]

## 2021-01-24 ENCOUNTER — Other Ambulatory Visit: Payer: Self-pay

## 2021-01-24 ENCOUNTER — Ambulatory Visit: Payer: Medicare Other

## 2021-01-24 ENCOUNTER — Ambulatory Visit: Payer: Medicare Other | Admitting: Occupational Therapy

## 2021-01-24 DIAGNOSIS — M6281 Muscle weakness (generalized): Secondary | ICD-10-CM

## 2021-01-24 DIAGNOSIS — R41841 Cognitive communication deficit: Secondary | ICD-10-CM | POA: Diagnosis not present

## 2021-01-24 DIAGNOSIS — R270 Ataxia, unspecified: Secondary | ICD-10-CM

## 2021-01-24 DIAGNOSIS — R471 Dysarthria and anarthria: Secondary | ICD-10-CM

## 2021-01-24 DIAGNOSIS — I69218 Other symptoms and signs involving cognitive functions following other nontraumatic intracranial hemorrhage: Secondary | ICD-10-CM

## 2021-01-24 DIAGNOSIS — I613 Nontraumatic intracerebral hemorrhage in brain stem: Secondary | ICD-10-CM

## 2021-01-24 DIAGNOSIS — R41844 Frontal lobe and executive function deficit: Secondary | ICD-10-CM

## 2021-01-24 DIAGNOSIS — R41842 Visuospatial deficit: Secondary | ICD-10-CM

## 2021-01-24 DIAGNOSIS — R260 Ataxic gait: Secondary | ICD-10-CM

## 2021-01-24 DIAGNOSIS — R208 Other disturbances of skin sensation: Secondary | ICD-10-CM

## 2021-01-24 DIAGNOSIS — R278 Other lack of coordination: Secondary | ICD-10-CM

## 2021-01-24 DIAGNOSIS — I69391 Dysphagia following cerebral infarction: Secondary | ICD-10-CM

## 2021-01-24 DIAGNOSIS — R2681 Unsteadiness on feet: Secondary | ICD-10-CM

## 2021-01-24 DIAGNOSIS — I69193 Ataxia following nontraumatic intracerebral hemorrhage: Secondary | ICD-10-CM

## 2021-01-24 MED ORDER — QUETIAPINE FUMARATE 50 MG PO TABS: 50.0000 mg | ORAL_TABLET | Freq: Every day | ORAL | 1 refills | Status: DC

## 2021-01-24 NOTE — Patient Instructions (Signed)
   Speech homework - twice a day  Practice abdominal breathing for 10 minutes- PUT HAND ON BELLY AND LOOK AT YOUR BELLY -we need to get this way of breathing to be your habit! Do the loud "ah" 5 times (use abdominal breathing) Do 15 minutes of the speech handouts - USING YOUR intentional and exhuberant voice!

## 2021-01-24 NOTE — Therapy (Signed)
[facility] [address] [place], [place], [address] Phone: [phone]   Fax:  [phone]  Occupational Therapy Treatment  Patient Details  Name: Walter [name] MRN: [id number] Date of Birth: 01/31/1956 Referring Provider (OT): Dr. Claudette Laws   Encounter Date: 01/24/2021   OT End of Session - 01/24/21 1106     Visit Number 4    Number of Visits 17    Date for OT Re-Evaluation 03/17/21    Authorization Type Medicare Part A & B    Authorization - Visit Number 4    Authorization - Number of Visits 10    OT Start Time 1105    OT Stop Time 1150    OT Time Calculation (min) 45 min    Activity Tolerance Patient tolerated treatment well    Behavior During Therapy Impulsive             Past Medical History:  Diagnosis Date   Atrial flutter (HCC)    s/p ablation   DVT (deep venous thrombosis) (HCC)    Near syncope 09/25/2013   Paroxysmal atrial fibrillation (HCC)    Stroke [facility])    initially hemorragic (not on [facility]) followed by subsequent embolic stroke    Past Surgical History:  Procedure Laterality Date   A-FLUTTER ABLATION N/A 03/09/2020   Procedure: A-FLUTTER ABLATION;  Surgeon: Hillis Range, MD;  Location: [facility] INVASIVE CV LAB;  Service: Cardiovascular;  Laterality: N/A;   CARDIOVERSION N/A 09/27/2013   Procedure: CARDIOVERSION;  Surgeon: Lewayne Bunting, MD;  Location: [facility] ENDOSCOPY;  Service: Cardiovascular;  Laterality: N/A;   IR ANGIO EXTERNAL CAROTID SEL EXT CAROTID UNI R MOD SED  09/26/2020   IR ANGIO INTRA EXTRACRAN SEL COM CAROTID INNOMINATE BILAT MOD SED  09/26/2020   IR ANGIO VERTEBRAL SEL VERTEBRAL UNI R MOD SED  09/26/2020   IR IVC FILTER PLMT / S&I /IMG GUID/MOD SED  10/08/2020   IR IVUS EACH ADDITIONAL NON CORONARY VESSEL  11/02/2020   IR PTA VENOUS EXCEPT DIALYSIS CIRCUIT  11/02/2020   IR RADIOLOGIST EVAL & MGMT  01/17/2021   IR THROMBECT VENO MECH MOD SED  10/27/2020   IR THROMBECT VENO MECH  MOD SED  11/02/2020   IR US GUIDE VASC ACCESS LEFT  10/27/2020   IR US GUIDE VASC ACCESS RIGHT  09/26/2020   IR US GUIDE VASC ACCESS RIGHT  11/02/2020   IR VENO/EXT/UNI RIGHT  11/02/2020   IR VENOCAVAGRAM IVC  10/27/2020   RETINAL DETACHMENT SURGERY     right knee arthroscopy     TEE WITHOUT CARDIOVERSION N/A 09/27/2013   Procedure: TRANSESOPHAGEAL ECHOCARDIOGRAM (TEE);  Surgeon: Lewayne Bunting, MD;  Location: [facility] ENDOSCOPY;  Service: Cardiovascular;  Laterality: N/A;   WRIST SURGERY      There were no vitals filed for this visit.   Subjective Assessment - 01/24/21 1142     Pertinent History ICH of brainstem (complicated by hydrocephalus, DVTs, and tracheostomy).  PMH:  a-fib    Limitations fall risk, ataxia, impulsivity    Patient Stated Goals use L side of body better (decrease ataxia), stay active and heal brain    Currently in Pain? No/denies            Pt denies pain today          Treatment: Placing connect 4 pieces into frame, for functional reach and coordination, min v.c Copying small peg design primarily with LUE for increased fine motor coordination, cognition and visual perceptual  skills, min difficulty/ drops due to ataxia, pt required min v.c for completion of design correctly, increased time required   Toilet transfer stand pivot with grab bar, min A for transfer, mod -max A for clothing manipulation          OT Education - 01/24/21 1131     Education Details HEP for diplopia, reviewed with pt, mod v.c required    Person(s) Educated Patient    Methods Explanation;Demonstration;Verbal cues;Handout;Tactile cues    Comprehension Returned demonstration;Verbalized understanding;Verbal cues required              OT Short Term Goals - 01/16/21 1518       OT SHORT TERM GOAL #1   Title Pt/caregiver will be independent with initial HEP for LUE coordination and R hand strength.--check STGs 02/15/21    Time 4    Period Weeks    Status New      OT SHORT  TERM GOAL #2   Title Pt/caregiver will be independent with vision HEP and visual compensation strategies.    Time 4    Period Weeks    Status New      OT SHORT TERM GOAL #3   Title Pt will improve LUE coordination for ADLs as shown by improving score on box and blocks test by at least 6.    Baseline L-21 blocks    Time 4    Period Weeks    Status New      OT SHORT TERM GOAL #4   Title Pt will perform simple snack prep/home maintenance task from w/c level mod I.    Time 4    Period Weeks    Status New      OT SHORT TERM GOAL #5   Title Pt will verbalize understanding of compensation strategies for sensory deficts for incr safety.    Time 4    Period Weeks    Status New      OT SHORT TERM GOAL #6   Title Pt will improve R hand grip stength to at least 32lbs to assist with ADLs/opening containers.    Time 4    Period Weeks    Status New               OT Long Term Goals - 01/16/21 1009       OT LONG TERM GOAL #1   Title Pt will be independent with updated HEP--check LTGs 03/18/21    Time 8    Period Weeks    Status New      OT LONG TERM GOAL #2   Title Pt will improve L hand coordination for ADLs as shown by completing 9-hole peg test in less than 70sec.    Baseline 107sec    Time 8    Period Weeks    Status New      OT LONG TERM GOAL #3   Title Pt will be mod I with toileting.    Time 8    Period Weeks    Status New      OT LONG TERM GOAL #4   Title Pt will perform simple snack prep/home maintenance tasks in standing with supervision.    Time 8    Period Weeks    Status New      OT LONG TERM GOAL #5   Title Pt will demo good safety awareness/compensation for sensory deficits and attention to LUE during functional tasks and transfers without cueing.    Time 8  Period Weeks    Status New      OT LONG TERM GOAL #6   Title Pt will perform simple environmental scanning/navigation with at least 90% accuracy for incr safety.    Time 8    Period Weeks     Status New                   Plan - 01/24/21 1132     Clinical Impression Statement Pt is progressing towards goals. He will require caregiver assist with vision HEP. [name] was not present for therapy session today.    OT Occupational Profile and History Detailed Assessment- Review of Records and additional review of physical, cognitive, psychosocial history related to current functional performance    Occupational performance deficits (Please refer to evaluation for details): ADL's;IADL's;Work;Leisure;Social Participation    Body Structure / Function / Physical Skills ADL;Strength;Balance;Proprioception;UE functional use;IADL;Endurance;Vision;Mobility;Coordination;Decreased knowledge of precautions;FMC;GMC;Sensation    Cognitive Skills Attention;Safety Awareness;Memory;Perception;Problem Solve    Rehab Potential Good    Clinical Decision Making Several treatment options, min-mod task modification necessary    Comorbidities Affecting Occupational Performance: May have comorbidities impacting occupational performance    Modification or Assistance to Complete Evaluation  Min-Moderate modification of tasks or assist with assess necessary to complete eval    OT Frequency 2x / week    OT Duration 8 weeks   +eval   OT Treatment/Interventions Self-care/ADL training;Moist Heat;Fluidtherapy;DME and/or AE instruction;Balance training;Therapeutic activities;Aquatic Therapy;Ultrasound;Therapeutic exercise;Cognitive remediation/compensation;Visual/perceptual remediation/compensation;Functional Mobility Training;Neuromuscular education;Cryotherapy;Energy conservation;Manual Therapy;Patient/family education    Plan caregiver ed if wife is present, continue to work on functional use of LUE/ NMR,    Consulted and Agree with Plan of Care Family member/caregiver;Patient    Family Member Consulted wife             Patient will benefit from skilled therapeutic intervention in order to improve  the following deficits and impairments:   Body Structure / Function / Physical Skills: ADL, Strength, Balance, Proprioception, UE functional use, IADL, Endurance, Vision, Mobility, Coordination, Decreased knowledge of precautions, FMC, GMC, Sensation Cognitive Skills: Attention, Safety Awareness, Memory, Perception, Problem Solve     Visit Diagnosis: Other lack of coordination  Muscle weakness (generalized)  Other disturbances of skin sensation  Visuospatial deficit  Frontal lobe and executive function deficit  Other symptoms and signs involving cognitive functions following other nontraumatic intracranial hemorrhage  Ataxia    Problem List Patient Active Problem List   Diagnosis Date Noted   Thrombosis    Sleep disturbance    Dysphagia, post-stroke    PAF (paroxysmal atrial fibrillation) (HCC)    Acute pulmonary embolism without acute cor pulmonale (HCC)    Malnutrition of moderate degree 11/04/2020   Tracheostomy care (HCC)    Pressure injury of skin 10/23/2020   Intracranial hemorrhage (HCC)    Atrial fibrillation (HCC)    Prediabetes    Leukocytosis    Acute blood loss anemia    Hypernatremia    ICH (intracerebral hemorrhage) (HCC) 09/25/2020   Unilateral primary osteoarthritis, left knee 09/26/2016   Atrial flutter (HCC) 09/25/2013   Near syncope 09/25/2013    Kendyl Bissonnette 01/24/2021, 12:23 PM  [facility] [address] [place], [place], [address] Phone: [phone]   Fax:  [phone]  Name: DAVEYON KITCHINGS MRN: [id number] Date of Birth: Nov 19, 1955

## 2021-01-24 NOTE — Therapy (Signed)
Date of Birth: [birth year] OUTPATIENT PHYSICAL THERAPY TREATMENT NOTE   Patient Name: Walter [name] MRN: [id number] DOB:[birth year], 65 y.o., male Today's Date: 01/26/2021  PCP: Clovis Riley, L.August Saucer, MD REFERRING PROVIDER: Erick Colace, MD   PT End of Session - 01/26/21 0825     Visit Number 6    Number of Visits 17    Date for PT Re-Evaluation 02/20/21    Authorization Type Medicare 12/26/20 to 02/20/21    Progress Note Due on Visit 10    PT Start Time 1235    PT Stop Time 1315    PT Time Calculation (min) 40 min    Equipment Utilized During Treatment Gait belt    Activity Tolerance Patient tolerated treatment well    Behavior During Therapy Impulsive              Past Medical History:  Diagnosis Date   Atrial flutter (HCC)    s/p ablation   DVT (deep venous thrombosis) (HCC)    Near syncope 09/25/2013   Paroxysmal atrial fibrillation (HCC)    Stroke [facility])    initially hemorragic (not on OAC) followed by subsequent embolic stroke   Past Surgical History:  Procedure Laterality Date   A-FLUTTER ABLATION N/A 03/09/2020   Procedure: A-FLUTTER ABLATION;  Surgeon: Hillis Range, MD;  Location: [facility] INVASIVE CV LAB;  Service: Cardiovascular;  Laterality: N/A;   CARDIOVERSION N/A 09/27/2013   Procedure: CARDIOVERSION;  Surgeon: Lewayne Bunting, MD;  Location: [facility] ENDOSCOPY;  Service: Cardiovascular;  Laterality: N/A;   IR ANGIO EXTERNAL CAROTID SEL EXT CAROTID UNI R MOD SED  09/26/2020   IR ANGIO INTRA EXTRACRAN SEL COM CAROTID INNOMINATE BILAT MOD SED  09/26/2020   IR ANGIO VERTEBRAL SEL VERTEBRAL UNI R MOD SED  09/26/2020   IR IVC FILTER PLMT / S&I /IMG GUID/MOD SED  10/08/2020   IR IVUS EACH ADDITIONAL NON CORONARY VESSEL  11/02/2020   IR PTA VENOUS EXCEPT DIALYSIS CIRCUIT  11/02/2020   IR RADIOLOGIST EVAL & MGMT  01/17/2021   IR THROMBECT VENO MECH MOD SED  10/27/2020   IR THROMBECT VENO MECH MOD SED  11/02/2020   IR US GUIDE VASC ACCESS LEFT  10/27/2020   IR US GUIDE VASC ACCESS  RIGHT  09/26/2020   IR US GUIDE VASC ACCESS RIGHT  11/02/2020   IR VENO/EXT/UNI RIGHT  11/02/2020   IR VENOCAVAGRAM IVC  10/27/2020   RETINAL DETACHMENT SURGERY     right knee arthroscopy     TEE WITHOUT CARDIOVERSION N/A 09/27/2013   Procedure: TRANSESOPHAGEAL ECHOCARDIOGRAM (TEE);  Surgeon: Lewayne Bunting, MD;  Location: [facility] ENDOSCOPY;  Service: Cardiovascular;  Laterality: N/A;   WRIST SURGERY     Patient Active Problem List   Diagnosis Date Noted   Thrombosis    Sleep disturbance    Dysphagia, post-stroke    PAF (paroxysmal atrial fibrillation) (HCC)    Acute pulmonary embolism without acute cor pulmonale (HCC)    Malnutrition of moderate degree 11/04/2020   Tracheostomy care (HCC)    Pressure injury of skin 10/23/2020   Intracranial hemorrhage (HCC)    Atrial fibrillation (HCC)    Prediabetes    Leukocytosis    Acute blood loss anemia    Hypernatremia    ICH (intracerebral hemorrhage) (HCC) 09/25/2020   Unilateral primary osteoarthritis, left knee 09/26/2016   Atrial flutter (HCC) 09/25/2013   Near syncope 09/25/2013    REFERRING DIAG: I61.3 (ICD-10-CM) - 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 (HCC) I69.193 (ICD-10-CM) -  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 Decreased, improved ventricle size since 10/06/2020 with stable small volume of IVH. 2. Hematoma and edema along the right superior approach EVD tract is stable, including a small volume of bilateral SAH. 3. Expected evolution of the left midbrain/thalamic hemorrhage and edema. 4. Small additional scattered subacute infarcts remain largely occult by CT. 5. No new intracranial abnormality identified.     IMPRESSION: 1. Similar appearance of intraventricular hemorrhage as well as hemorrhage along prior EVD tract in the right frontal lobe  and scattered cortical subarachnoid hemorrhage. 2. Ventricular size remains stable. 3. No significant change in size of intraparenchymal hemorrhage centered within the left side of the midbrain extending into the thalamic pulvinar and superior cerebellar peduncle. 4. A few small foci of restricted diffusion within the left frontal and parietal lobes, corresponding to small acute/subacute infarcts  PRECAUTIONS: fall  SUBJECTIVE: Pt reports he walked 0.5 mile before coming to rehab today.   Objective: See treatment notes  Sit<>stand CGA for safety (very fast/impulsive)  Stand pivot t/f min/CGA  Scooting/squat pivot t/f CGA   TODAY'S TREATMENT: 01/24/21 Standing normal BOS: EO: 30 sec Standing normal BOS: EC: 1 min Standing normal BOS: EO, horizontal head turns: 20x Standing deadlifts with 15lb KB from floor: 10x, pt demo decreased WB to L LE Standing weight shifts: lateral and anterior/posterior: 20x Sit to stand: 15lb KB in hands: from standard chair, tactile and verbal cues for "nose over toes" to prevent retropulsion. Standing DOT taps: 10x L only with 2lbs at ankle to improve coordination, 10x alternating with R and L, required min to mod A for balance without HHA Standing normal BOS: bil shoulder flexion holding ball in hand: 10x; big circles: 10x cw, 10x ccw, cues to improve lateral reaching to improve trunk rotation when performing circles. Fwd step ups 6" box 10x R and L with bil UE support    01/18/21 Sit<>stand x10  Seated heel slide with washrag x10  Standing heel slide forward with washrag 3x10 with targets  Standing heel slide to the side with washrag 3x10 with targets  Standing step forward and back on to targets 3x10  Standing balance with alternating UE raises 2x10  In // bars: forward stepping, pt cued to step on targets and "slide" foot to keep from L LE marching x 6 trips  Amb around gym 2x115' with tactile cues to keep from excessive L hip flexion, v/cs to  "slide" L foot forward and slow down steps    01/16/21  Latissimus press 15x  Seated FAQs 2# alt 15x w/lat press  Seated marching 2# alt 15x w/lat press  Seated heel slides using towel 15x per side  Seated core exercises of hip and shoulder tosses, chops and Vs with 2.2# ball, 10x ea.   Seated OH lift with inspiration using 2.2# ball, 10x   Seated forward flexion with HHA from PT 10x  5xSTS with CGA diminishing to SBA  Lateral reaches over red physioball 10x ea.   01/08/21  Supine bridge, alternating marching, hip fallouts, alternating UE F/E all 2x15  Seated LAQs, marching, 2x15 performed with lat press    01/03/21 BERG score 11/56, 5x STS 35.9s with CGA Balance training in //bars, fwd and sidestep 10x per LE, marching alt pattern and heel/toe raises Stepping tasks fwd using 4" block, 10x per LE with fwd WS in //  bars Latissimus press with deep inspiration 10 reps  Tapping floor targets, single target in center 10x alt in // bars 2 targets lateral of center, 10x alt in //bars    PATIENT EDUCATION: Education details: sit to stand instructions for safety Person educated: Patient and Spouse Education method: Explanation, Demonstration, and Verbal cues Education comprehension: verbalized understanding     HOME EXERCISE PROGRAM: Step/tapping on targets at home  ASSESSMENT:   CLINICAL IMPRESSION: Pt tends to demo decreased WB on L LE. Did well with lateral weight shifts as he shifted weight evenly but with anterior/posteriro weight shifts he needed tactile cueing to keep weight equally between R and L LE.   REHAB POTENTIAL: Good   CLINICAL DECISION MAKING: Stable/uncomplicated GOALS: Goals reviewed with patient? Yes   SHORT TERM GOALS:   STG Name Target Date Goal status  1 Patient will be able to perform chair to chair transfer with CGA/SBA with use of RW for safety to improve independence. Baseline: min to mod A (12/26/20) 01/23/2021 01/03/21 MinA/CGA to transfer in/out of WC   2 Pt will be able to ambulate 115' with RW with CGA to min A with RW for safety to improve independence Baseline: min to mod A with RW 145' 01/23/2021 INITIAL  3 Pt will be able to maintain standing balance for 30 sec to improve static balance Baseline: 10 sec before needing help due to LOB (12/26/20) 01/23/2021 01/03/21 Able to stand 2 min with CGA    LONG TERM GOALS:   LTG Name Target Date Goal status  1 Pt will demo >5 points improvement on Berg Balance Scale to improve static balance Baseline: 11/56 01/03/21 02/20/2021 INITIAL  2 Pt will be able to ambulate >500 feet with RW and SBA to improve walking endurance and safety. Baseline: 145' with RW min to mod A 02/20/2021 INITIAL  3 Pt will be able to perform gait speed improvement by 0.60m/s to improve community ambulation. Baseline:TBD 02/20/2021 INITIAL  4 Pt will demo 5 sec improvement in 5x sit to stand (with or without UE support) to improve functional strength Baseline: TBD 02/20/2021 INITIAL    PLAN: PT FREQUENCY: 2x/week   PT DURATION: 8 weeks   PLANNED INTERVENTIONS: Therapeutic exercises, Therapeutic activity, Neuro Muscular re-education, Balance training, Gait training, Patient/Family education, Joint mobilization, Stair training, Visual/preceptual remediation/compensation, Wheelchair mobility training, Cryotherapy, Moist heat, and Manual therapy   PLAN FOR NEXT SESSION: Standing tasks in // bars, floor targets and stepping tasks, Scifit?, STS and pivots     Patient will benefit from skilled therapeutic intervention in order to improve the following deficits and impairments:     Visit Diagnosis: Muscle weakness (generalized)  Unsteadiness on feet  Ataxic gait     Problem List Patient Active Problem List   Diagnosis Date Noted   Thrombosis    Sleep disturbance    Dysphagia, post-stroke    PAF (paroxysmal atrial fibrillation) (HCC)    Acute pulmonary embolism without acute cor pulmonale (HCC)    Malnutrition of  moderate degree 11/04/2020   Tracheostomy care (HCC)    Pressure injury of skin 10/23/2020   Intracranial hemorrhage (HCC)    Atrial fibrillation (HCC)    Prediabetes    Leukocytosis    Acute blood loss anemia    Hypernatremia    ICH (intracerebral hemorrhage) (HCC) 09/25/2020   Unilateral primary osteoarthritis, left knee 09/26/2016   Atrial flutter (HCC) 09/25/2013   Near syncope 09/25/2013    [name] PT, DPT 01/26/2021, 8:26  AM  [facility] [address] [place], [place], [address] Phone: [phone]   Fax:  [phone]  Name: Walter [name] MRN: [id number] Date of Birth: [birth year]

## 2021-01-24 NOTE — Therapy (Addendum)
[facility] [address] [place], [place], [address] Phone: [phone]   Fax:  [phone]  Speech Language Pathology Treatment  Patient Details  Name: Walter [name] MRN: [id number] Date of Birth: 1955/11/03 Referring Provider (SLP): Erick Colace, MD   Encounter Date: 01/24/2021   End of Session - 01/24/21 1747     Visit Number 7    Number of Visits 17    Date for SLP Re-Evaluation 02/21/21    Authorization Type Medicare    SLP Start Time 1151    SLP Stop Time  1231    SLP Time Calculation (min) 40 min    Activity Tolerance Patient tolerated treatment well             Past Medical History:  Diagnosis Date   Atrial flutter (HCC)    s/p ablation   DVT (deep venous thrombosis) (HCC)    Near syncope 09/25/2013   Paroxysmal atrial fibrillation (HCC)    Stroke [facility])    initially hemorragic (not on [facility]) followed by subsequent embolic stroke    Past Surgical History:  Procedure Laterality Date   A-FLUTTER ABLATION N/A 03/09/2020   Procedure: A-FLUTTER ABLATION;  Surgeon: Hillis Range, MD;  Location: [facility] INVASIVE CV LAB;  Service: Cardiovascular;  Laterality: N/A;   CARDIOVERSION N/A 09/27/2013   Procedure: CARDIOVERSION;  Surgeon: Lewayne Bunting, MD;  Location: [facility] ENDOSCOPY;  Service: Cardiovascular;  Laterality: N/A;   IR ANGIO EXTERNAL CAROTID SEL EXT CAROTID UNI R MOD SED  09/26/2020   IR ANGIO INTRA EXTRACRAN SEL COM CAROTID INNOMINATE BILAT MOD SED  09/26/2020   IR ANGIO VERTEBRAL SEL VERTEBRAL UNI R MOD SED  09/26/2020   IR IVC FILTER PLMT / S&I /IMG GUID/MOD SED  10/08/2020   IR IVUS EACH ADDITIONAL NON CORONARY VESSEL  11/02/2020   IR PTA VENOUS EXCEPT DIALYSIS CIRCUIT  11/02/2020   IR RADIOLOGIST EVAL & MGMT  01/17/2021   IR THROMBECT VENO MECH MOD SED  10/27/2020   IR THROMBECT VENO MECH MOD SED  11/02/2020   IR US GUIDE VASC ACCESS LEFT  10/27/2020   IR US GUIDE VASC ACCESS RIGHT  09/26/2020   IR US  GUIDE VASC ACCESS RIGHT  11/02/2020   IR VENO/EXT/UNI RIGHT  11/02/2020   IR VENOCAVAGRAM IVC  10/27/2020   RETINAL DETACHMENT SURGERY     right knee arthroscopy     TEE WITHOUT CARDIOVERSION N/A 09/27/2013   Procedure: TRANSESOPHAGEAL ECHOCARDIOGRAM (TEE);  Surgeon: Lewayne Bunting, MD;  Location: [facility] ENDOSCOPY;  Service: Cardiovascular;  Laterality: N/A;   WRIST SURGERY      There were no vitals filed for this visit.   Subjective Assessment - 01/24/21 1158     Subjective "I can see if (wife) can come."    Currently in Pain? No/denies                   ADULT SLP TREATMENT - 01/24/21 1214       General Information   Behavior/Cognition Alert;Cooperative;Pleasant mood;Requires cueing      Treatment Provided   Treatment provided Cognitive-Linquistic      Cognitive-Linquistic Treatment   Treatment focused on Cognition;Dysarthria;Patient/family/caregiver education    Skilled Treatment Pt closing eyes multiple times during ST today, however responded when asked a question. SLP used loud /a/ to assist in recalibration of pt's speaking volume with pt average mid 80s dB. SLP did /a/ simultaneously with pt x2, then pt performed x5 with rare  min A for relaxed voice. SLP reminded pt about abdominal breathing (AB) and then had pt use visual cue with his hand on stomach. When pt not using visual support he had difficuty with proper abdominal movement. In word responses, pt generated AB 70% of the time when looking at his hand - ~10% when not looking at his hand. SLP cued pt that when working on AB he needs to look at his hand to ensure proper abdominal movement. Pt did not exhibit any carryover of louder speech to comments between stimuli or between tasks unless given visual and verbal cues from SLP. SLP reviewed pt's home tasks with him - see pt instructions. Likely pt will require assistance/support at home for proper completion of HEP due to decr'd cognition. PT asking wife if she can attend tx  for fostering carryover to home. SLP also encouraged pt to ask wife to attend tx sessions.     Assessment / Recommendations / Plan   Plan Continue with current plan of care      Progression Toward Goals   Progression toward goals Progressing toward goals              SLP Education - 01/24/21 1741     Education Details Home practice for speech including AB, procedure for AB    Person(s) Educated Patient    Methods Explanation;Demonstration    Comprehension Verbalized understanding;Returned demonstration;Need further instruction;Verbal cues required              SLP Short Term Goals - 01/24/21 1749       SLP SHORT TERM GOAL #1   Title Pt will complete dysarthria HEP with occasional min A over 2 sessions    Time 1    Period Weeks    Status On-going      SLP SHORT TERM GOAL #2   Title Pt will produce loud /a/ or "hey!" with at least high 80s dB average over 2 sessions    Time 1    Period Weeks    Status Revised      SLP SHORT TERM GOAL #3   Title Pt will generate abdominal breathing in 16/20 answers to SLP questions over 2 sessions    Baseline 01-18-21    Time 1    Period Weeks    Status On-going      SLP SHORT TERM GOAL #4   Title Pt will demonstrate dysarthria compensations in 10 minute simple to mod complex conversation to be 75% intelligibile with occasional min A over 2 sessions    Time 1    Period Weeks    Status On-going      SLP SHORT TERM GOAL #5   Title Pt will complete formal cognitive linguistic assessment in first 1-2 ST sessions    Status Achieved      SLP SHORT TERM GOAL #6   Title Pt will use memory and attention compensations for appointments, medicine management, and other daily activities with occasional min A over 2 sessions    Time 2    Period Weeks    Status On-going              SLP Long Term Goals - 01/24/21 1749       SLP LONG TERM GOAL #1   Title Pt will complete dysarthria HEP with rare min A over 2 sessions    Time 5     Period Weeks    Status On-going      SLP LONG TERM  GOAL #2   Title Pt will demonstrate dysarthria compensations in 15+ minute simple to mod complex conversation to be 90% intelligibile with rare min A over 2 sessions    Time 5    Period Weeks    Status On-going      SLP LONG TERM GOAL #3   Title Pt will demo improved sustained and selective attention during therapeutic tasks with less than 3 cues for active listening during 2 sessions    Time 5    Period Weeks    Status On-going      SLP LONG TERM GOAL #4   Title Pt will demonstrate emergent and anticipatory awareness of impulsivity with less than 5 cues to modify behaviors during 2 sessions    Time 5    Period Weeks    Status On-going      SLP LONG TERM GOAL #5   Title Pt will report reduced frustration and improved communication effectiveness via PROM by 2 points at last ST sessions    Baseline CES: 15.5 & V-RQOL:35    Time 5    Period Weeks    Status On-going              Plan - 01/24/21 1748     Clinical Impression Statement "[place]" presents for OPST intervention secondary to stroke in March 2022. SLP continued education and training pt in breath support, and audible, intelligible speech. Occasional verbal and visual cues needed to optimize breath support and maximize vocal intensity and clarity on structured speech tasks this session. Skilled ST is cont'd warranted to address dysarthria and cognition to maximize communication effectiveness and optimize functional independence and safety at home.    Speech Therapy Frequency 2x / week    Duration 8 weeks    Treatment/Interventions Cueing hierarchy;Functional tasks;Patient/family education;Cognitive reorganization;Multimodal communcation approach;Language facilitation;Compensatory techniques;Internal/external aids;SLP instruction and feedback    Potential to Achieve Goals Fair    SLP Home Exercise Plan provided    Consulted and Agree with Plan of Care Patient              Patient will benefit from skilled therapeutic intervention in order to improve the following deficits and impairments:   Dysarthria and anarthria  Cognitive communication deficit    Problem List Patient Active Problem List   Diagnosis Date Noted   Thrombosis    Sleep disturbance    Dysphagia, post-stroke    PAF (paroxysmal atrial fibrillation) (HCC)    Acute pulmonary embolism without acute cor pulmonale (HCC)    Malnutrition of moderate degree 11/04/2020   Tracheostomy care (HCC)    Pressure injury of skin 10/23/2020   Intracranial hemorrhage (HCC)    Atrial fibrillation (HCC)    Prediabetes    Leukocytosis    Acute blood loss anemia    Hypernatremia    ICH (intracerebral hemorrhage) (HCC) 09/25/2020   Unilateral primary osteoarthritis, left knee 09/26/2016   Atrial flutter (HCC) 09/25/2013   Near syncope 09/25/2013    [facility]. ,MS, CCC-SLP  01/24/2021, 6:00 PM  [place] [facility] [address] [place], [place], [address] Phone: [phone]   Fax:  [phone]   Name: SHAUNAK KREIS MRN: [id number] Date of Birth: 24-Mar-1956

## 2021-01-26 ENCOUNTER — Ambulatory Visit: Payer: Medicare Other

## 2021-01-26 ENCOUNTER — Other Ambulatory Visit: Payer: Self-pay

## 2021-01-26 ENCOUNTER — Ambulatory Visit: Payer: Medicare Other | Admitting: Occupational Therapy

## 2021-01-26 DIAGNOSIS — R208 Other disturbances of skin sensation: Secondary | ICD-10-CM

## 2021-01-26 DIAGNOSIS — R278 Other lack of coordination: Secondary | ICD-10-CM

## 2021-01-26 DIAGNOSIS — R260 Ataxic gait: Secondary | ICD-10-CM

## 2021-01-26 DIAGNOSIS — R41841 Cognitive communication deficit: Secondary | ICD-10-CM | POA: Diagnosis not present

## 2021-01-26 DIAGNOSIS — M6281 Muscle weakness (generalized): Secondary | ICD-10-CM

## 2021-01-26 DIAGNOSIS — I69218 Other symptoms and signs involving cognitive functions following other nontraumatic intracranial hemorrhage: Secondary | ICD-10-CM

## 2021-01-26 DIAGNOSIS — R2681 Unsteadiness on feet: Secondary | ICD-10-CM

## 2021-01-26 DIAGNOSIS — R471 Dysarthria and anarthria: Secondary | ICD-10-CM

## 2021-01-26 DIAGNOSIS — R41842 Visuospatial deficit: Secondary | ICD-10-CM

## 2021-01-26 DIAGNOSIS — R41844 Frontal lobe and executive function deficit: Secondary | ICD-10-CM

## 2021-01-26 DIAGNOSIS — R270 Ataxia, unspecified: Secondary | ICD-10-CM

## 2021-01-26 NOTE — Therapy (Signed)
[facility] [address] [place], [place], [address] Phone: [phone]   Fax:  [phone]  Occupational Therapy Treatment  Patient Details  Name: Walter [name] MRN: [id number] Date of Birth: Jul 28, 1955 Referring Provider (OT): Dr. Claudette Laws   Encounter Date: 01/26/2021   OT End of Session - 01/26/21 1422     Visit Number 5    Number of Visits 17    Date for OT Re-Evaluation 03/17/21    Authorization Type Medicare Part A & B    Authorization - Visit Number 5    Authorization - Number of Visits 10    Progress Note Due on Visit 10    OT Start Time 1150    OT Stop Time 1230    OT Time Calculation (min) 40 min             Past Medical History:  Diagnosis Date   Atrial flutter (HCC)    s/p ablation   DVT (deep venous thrombosis) (HCC)    Near syncope 09/25/2013   Paroxysmal atrial fibrillation (HCC)    Stroke [facility])    initially hemorragic (not on [facility]) followed by subsequent embolic stroke    Past Surgical History:  Procedure Laterality Date   A-FLUTTER ABLATION N/A 03/09/2020   Procedure: A-FLUTTER ABLATION;  Surgeon: Hillis Range, MD;  Location: [facility] INVASIVE CV LAB;  Service: Cardiovascular;  Laterality: N/A;   CARDIOVERSION N/A 09/27/2013   Procedure: CARDIOVERSION;  Surgeon: Lewayne Bunting, MD;  Location: [facility] ENDOSCOPY;  Service: Cardiovascular;  Laterality: N/A;   IR ANGIO EXTERNAL CAROTID SEL EXT CAROTID UNI R MOD SED  09/26/2020   IR ANGIO INTRA EXTRACRAN SEL COM CAROTID INNOMINATE BILAT MOD SED  09/26/2020   IR ANGIO VERTEBRAL SEL VERTEBRAL UNI R MOD SED  09/26/2020   IR IVC FILTER PLMT / S&I /IMG GUID/MOD SED  10/08/2020   IR IVUS EACH ADDITIONAL NON CORONARY VESSEL  11/02/2020   IR PTA VENOUS EXCEPT DIALYSIS CIRCUIT  11/02/2020   IR RADIOLOGIST EVAL & MGMT  01/17/2021   IR THROMBECT VENO MECH MOD SED  10/27/2020   IR THROMBECT VENO MECH MOD SED  11/02/2020   IR US GUIDE VASC ACCESS LEFT  10/27/2020    IR US GUIDE VASC ACCESS RIGHT  09/26/2020   IR US GUIDE VASC ACCESS RIGHT  11/02/2020   IR VENO/EXT/UNI RIGHT  11/02/2020   IR VENOCAVAGRAM IVC  10/27/2020   RETINAL DETACHMENT SURGERY     right knee arthroscopy     TEE WITHOUT CARDIOVERSION N/A 09/27/2013   Procedure: TRANSESOPHAGEAL ECHOCARDIOGRAM (TEE);  Surgeon: Lewayne Bunting, MD;  Location: [facility] ENDOSCOPY;  Service: Cardiovascular;  Laterality: N/A;   WRIST SURGERY      There were no vitals filed for this visit.   Subjective Assessment - 01/26/21 1407     Subjective  Denies pain    Pertinent History ICH of brainstem (complicated by hydrocephalus, DVTs, and tracheostomy).  PMH:  a-fib    Patient Stated Goals use L side of body better (decrease ataxia), stay active and heal brain    Currently in Pain? No/denies                       Functional mid range reaching with left and right UE's to place graded clothespins on vertical antennae., min difficulty, v.c Placing grooved pegs in pegboard with LUE for in improved fine motor coordination, mod difficulty/ v.c  OT Education - 01/26/21 1408     Education Details Cane exercises issued, pt/ dtr were educated and pt returned demonstration. Therapist verbally instructed pt's dtr in eye exercises. Therpist recommended pt has hired caregiver come in for education    Person(s) Educated Patient;Child(ren)    Methods Explanation;Verbal cues;Handout;Demonstration;Tactile cues    Comprehension Verbalized understanding;Returned demonstration;Verbal cues required              OT Short Term Goals - 01/16/21 1518       OT SHORT TERM GOAL #1   Title Pt/caregiver will be independent with initial HEP for LUE coordination and R hand strength.--check STGs 02/15/21    Time 4    Period Weeks    Status New      OT SHORT TERM GOAL #2   Title Pt/caregiver will be independent with vision HEP and visual compensation strategies.    Time 4    Period Weeks    Status New       OT SHORT TERM GOAL #3   Title Pt will improve LUE coordination for ADLs as shown by improving score on box and blocks test by at least 6.    Baseline L-21 blocks    Time 4    Period Weeks    Status New      OT SHORT TERM GOAL #4   Title Pt will perform simple snack prep/home maintenance task from w/c level mod I.    Time 4    Period Weeks    Status New      OT SHORT TERM GOAL #5   Title Pt will verbalize understanding of compensation strategies for sensory deficts for incr safety.    Time 4    Period Weeks    Status New      OT SHORT TERM GOAL #6   Title Pt will improve R hand grip stength to at least 32lbs to assist with ADLs/opening containers.    Time 4    Period Weeks    Status New               OT Long Term Goals - 01/16/21 1009       OT LONG TERM GOAL #1   Title Pt will be independent with updated HEP--check LTGs 03/18/21    Time 8    Period Weeks    Status New      OT LONG TERM GOAL #2   Title Pt will improve L hand coordination for ADLs as shown by completing 9-hole peg test in less than 70sec.    Baseline 107sec    Time 8    Period Weeks    Status New      OT LONG TERM GOAL #3   Title Pt will be mod I with toileting.    Time 8    Period Weeks    Status New      OT LONG TERM GOAL #4   Title Pt will perform simple snack prep/home maintenance tasks in standing with supervision.    Time 8    Period Weeks    Status New      OT LONG TERM GOAL #5   Title Pt will demo good safety awareness/compensation for sensory deficits and attention to LUE during functional tasks and transfers without cueing.    Time 8    Period Weeks    Status New      OT LONG TERM GOAL #6   Title Pt will perform simple environmental  scanning/navigation with at least 90% accuracy for incr safety.    Time 8    Period Weeks    Status New                   Plan - 01/26/21 1418     Clinical Impression Statement Pt is progressing towards goals.Pt/ dtr  verbalize understanding of cane exercises.    OT Occupational Profile and History Detailed Assessment- Review of Records and additional review of physical, cognitive, psychosocial history related to current functional performance    Occupational performance deficits (Please refer to evaluation for details): ADL's;IADL's;Work;Leisure;Social Participation    Body Structure / Function / Physical Skills ADL;Strength;Balance;Proprioception;UE functional use;IADL;Endurance;Vision;Mobility;Coordination;Decreased knowledge of precautions;FMC;GMC;Sensation    Cognitive Skills Attention;Safety Awareness;Memory;Perception;Problem Solve    Rehab Potential Good    Clinical Decision Making Several treatment options, min-mod task modification necessary    Comorbidities Affecting Occupational Performance: May have comorbidities impacting occupational performance    Modification or Assistance to Complete Evaluation  Min-Moderate modification of tasks or assist with assess necessary to complete eval    OT Frequency 2x / week    OT Duration 8 weeks   +eval   OT Treatment/Interventions Self-care/ADL training;Moist Heat;Fluidtherapy;DME and/or AE instruction;Balance training;Therapeutic activities;Aquatic Therapy;Ultrasound;Therapeutic exercise;Cognitive remediation/compensation;Visual/perceptual remediation/compensation;Functional Mobility Training;Neuromuscular education;Cryotherapy;Energy conservation;Manual Therapy;Patient/family education    Plan continue to work on functional use of LUE/ NMR,    Consulted and Agree with Plan of Care Family member/caregiver;Patient    Family Member Consulted wife             Patient will benefit from skilled therapeutic intervention in order to improve the following deficits and impairments:   Body Structure / Function / Physical Skills: ADL, Strength, Balance, Proprioception, UE functional use, IADL, Endurance, Vision, Mobility, Coordination, Decreased knowledge of  precautions, FMC, GMC, Sensation Cognitive Skills: Attention, Safety Awareness, Memory, Perception, Problem Solve     Visit Diagnosis: Other lack of coordination  Muscle weakness (generalized)  Other disturbances of skin sensation  Visuospatial deficit  Frontal lobe and executive function deficit  Other symptoms and signs involving cognitive functions following other nontraumatic intracranial hemorrhage    Problem List Patient Active Problem List   Diagnosis Date Noted   Thrombosis    Sleep disturbance    Dysphagia, post-stroke    PAF (paroxysmal atrial fibrillation) (HCC)    Acute pulmonary embolism without acute cor pulmonale (HCC)    Malnutrition of moderate degree 11/04/2020   Tracheostomy care (HCC)    Pressure injury of skin 10/23/2020   Intracranial hemorrhage (HCC)    Atrial fibrillation (HCC)    Prediabetes    Leukocytosis    Acute blood loss anemia    Hypernatremia    ICH (intracerebral hemorrhage) (HCC) 09/25/2020   Unilateral primary osteoarthritis, left knee 09/26/2016   Atrial flutter (HCC) 09/25/2013   Near syncope 09/25/2013    Brannon Decaire 01/26/2021, 2:23 PM  [facility] [address] [place], [place], [address] Phone: [phone]   Fax:  [phone]  Name: Walter [name] MRN: [id number] Date of Birth: 03/02/1956

## 2021-01-26 NOTE — Therapy (Signed)
[facility] [address] [place], [place], [address] Phone: [phone]   Fax:  [phone]  Speech Language Pathology Treatment  Patient Details  Name: Walter [name] MRN: [id number] Date of Birth: 27-Nov-1955 Referring Provider (SLP): Erick Colace, MD   Encounter Date: 01/26/2021   End of Session - 01/26/21 1220     Visit Number 8    Number of Visits 17    Date for SLP Re-Evaluation 02/21/21    Authorization Type Medicare    SLP Start Time 1235    SLP Stop Time  1315    SLP Time Calculation (min) 40 min    Activity Tolerance Patient tolerated treatment well             Past Medical History:  Diagnosis Date   Atrial flutter (HCC)    s/p ablation   DVT (deep venous thrombosis) (HCC)    Near syncope 09/25/2013   Paroxysmal atrial fibrillation (HCC)    Stroke [facility])    initially hemorragic (not on [facility]) followed by subsequent embolic stroke    Past Surgical History:  Procedure Laterality Date   A-FLUTTER ABLATION N/A 03/09/2020   Procedure: A-FLUTTER ABLATION;  Surgeon: Hillis Range, MD;  Location: [facility] INVASIVE CV LAB;  Service: Cardiovascular;  Laterality: N/A;   CARDIOVERSION N/A 09/27/2013   Procedure: CARDIOVERSION;  Surgeon: Lewayne Bunting, MD;  Location: [facility] ENDOSCOPY;  Service: Cardiovascular;  Laterality: N/A;   IR ANGIO EXTERNAL CAROTID SEL EXT CAROTID UNI R MOD SED  09/26/2020   IR ANGIO INTRA EXTRACRAN SEL COM CAROTID INNOMINATE BILAT MOD SED  09/26/2020   IR ANGIO VERTEBRAL SEL VERTEBRAL UNI R MOD SED  09/26/2020   IR IVC FILTER PLMT / S&I /IMG GUID/MOD SED  10/08/2020   IR IVUS EACH ADDITIONAL NON CORONARY VESSEL  11/02/2020   IR PTA VENOUS EXCEPT DIALYSIS CIRCUIT  11/02/2020   IR RADIOLOGIST EVAL & MGMT  01/17/2021   IR THROMBECT VENO MECH MOD SED  10/27/2020   IR THROMBECT VENO MECH MOD SED  11/02/2020   IR US GUIDE VASC ACCESS LEFT  10/27/2020   IR US GUIDE VASC ACCESS RIGHT  09/26/2020   IR US  GUIDE VASC ACCESS RIGHT  11/02/2020   IR VENO/EXT/UNI RIGHT  11/02/2020   IR VENOCAVAGRAM IVC  10/27/2020   RETINAL DETACHMENT SURGERY     right knee arthroscopy     TEE WITHOUT CARDIOVERSION N/A 09/27/2013   Procedure: TRANSESOPHAGEAL ECHOCARDIOGRAM (TEE);  Surgeon: Lewayne Bunting, MD;  Location: [facility] ENDOSCOPY;  Service: Cardiovascular;  Laterality: N/A;   WRIST SURGERY      There were no vitals filed for this visit.   Subjective Assessment - 01/26/21 1231     Subjective "I have to go to the bathroom"    Patient is accompained by: Family member   daughter, Santina Evans   Currently in Pain? No/denies                   ADULT SLP TREATMENT - 01/26/21 1219       General Information   Behavior/Cognition Alert;Cooperative;Pleasant mood;Requires cueing      Treatment Provided   Treatment provided Cognitive-Linquistic      Cognitive-Linquistic Treatment   Treatment focused on Cognition;Dysarthria;Patient/family/caregiver education    Skilled Treatment Pt immediately requested to go to the bathroom after setting up in ST room, in which pt's daughter (Cat) assisted him to the bathroom. Good volume and clarity exhibited for patient request. Inconsistent  completion of HEP reported. SLP reviewed HEP and frequency with patient and daughter, including loud /a/, functional sentences, and reading aloud. Usual neck tension exhibited during trials of /a/, although mid 80s db achieved. SLP provided verbal feedback and modeling, which did not consistently reduce muscle tension. Pt recalled need for tactile cue on stomach to increase consistency of abdominal breathing, with intermittent min verbal and visual cues required to maintain hand placement on abdomen throughout session. SLP trialed reading aloud short paragraphs, with usual fading to occasional verbal and visual cues needed to use abdominal breathing throughout tasks. Intermittent wet vocal quality noted, with presence of phlegm reported. Pt  unable to clear phlegm with use of throat clear alternatives. Pt exhibited flucutations in attention and mild lethargy this session, with increased frequency of cues provided to maximize vocal intensity and clarity in conversation. SLP educated pt's daughter on using non-verbal cue (cupping ear) when pt is speaking at low volume at home.      Assessment / Recommendations / Plan   Plan Continue with current plan of care      Progression Toward Goals   Progression toward goals Progressing toward goals              SLP Education - 01/26/21 1324     Education Details tactile cue/hand placement for abdominal breathing, cupping ear during conversation at home, reading practice at home with grandson    Person(s) Educated Patient    Methods Explanation;Demonstration;Handout    Comprehension Verbalized understanding;Returned demonstration;Need further instruction              SLP Short Term Goals - 01/26/21 1220       SLP SHORT TERM GOAL #1   Title Pt will complete dysarthria HEP with occasional min A over 2 sessions    Time 1    Period Weeks    Status On-going      SLP SHORT TERM GOAL #2   Title Pt will produce loud /a/ or "hey!" with at least high 80s dB average over 2 sessions    Time 1    Period Weeks    Status On-going      SLP SHORT TERM GOAL #3   Title Pt will generate abdominal breathing in 16/20 answers to SLP questions over 2 sessions    Baseline 01-18-21    Time 1    Period Weeks    Status On-going      SLP SHORT TERM GOAL #4   Title Pt will demonstrate dysarthria compensations in 10 minute simple to mod complex conversation to be 75% intelligibile with occasional min A over 2 sessions    Time 1    Period Weeks    Status On-going      SLP SHORT TERM GOAL #5   Title Pt will complete formal cognitive linguistic assessment in first 1-2 ST sessions    Status Achieved      SLP SHORT TERM GOAL #6   Title Pt will use memory and attention compensations for  appointments, medicine management, and other daily activities with occasional min A over 2 sessions    Time 1    Period Weeks    Status On-going              SLP Long Term Goals - 01/26/21 1220       SLP LONG TERM GOAL #1   Title Pt will complete dysarthria HEP with rare min A over 2 sessions    Time 5  Period Weeks    Status On-going      SLP LONG TERM GOAL #2   Title Pt will demonstrate dysarthria compensations in 15+ minute simple to mod complex conversation to be 90% intelligibile with rare min A over 2 sessions    Time 5    Period Weeks    Status On-going      SLP LONG TERM GOAL #3   Title Pt will demo improved sustained and selective attention during therapeutic tasks with less than 3 cues for active listening during 2 sessions    Time 5    Period Weeks    Status On-going      SLP LONG TERM GOAL #4   Title Pt will demonstrate emergent and anticipatory awareness of impulsivity with less than 5 cues to modify behaviors during 2 sessions    Time 5    Period Weeks    Status On-going      SLP LONG TERM GOAL #5   Title Pt will report reduced frustration and improved communication effectiveness via PROM by 2 points at last ST sessions    Baseline CES: 15.5 & V-RQOL:35    Time 5    Period Weeks    Status On-going              Plan - 01/26/21 1221     Clinical Impression Statement "Walter [name]" presents for OPST intervention secondary to stroke in March 2022. SLP continued education and training pt and daughter in breath support, and audible, intelligible speech. Occasional to usual verbal and visual cues needed to optimize breath support and maximize vocal intensity and clarity on structured speech tasks this session. Vocal quality impacted by reported phlegm this session, which pt unable to clear with use of cued throat clear alternatives. Skilled ST is cont'd warranted to address dysarthria and cognition to maximize communication effectiveness and optimize functional  independence and safety at home.    Speech Therapy Frequency 2x / week    Duration 8 weeks    Treatment/Interventions Cueing hierarchy;Functional tasks;Patient/family education;Cognitive reorganization;Multimodal communcation approach;Language facilitation;Compensatory techniques;Internal/external aids;SLP instruction and feedback    Potential to Achieve Goals Fair    SLP Home Exercise Plan provided    Consulted and Agree with Plan of Care Patient;Family member/caregiver             Patient will benefit from skilled therapeutic intervention in order to improve the following deficits and impairments:   Dysarthria and anarthria  Cognitive communication deficit    Problem List Patient Active Problem List   Diagnosis Date Noted   Thrombosis    Sleep disturbance    Dysphagia, post-stroke    PAF (paroxysmal atrial fibrillation) (HCC)    Acute pulmonary embolism without acute cor pulmonale (HCC)    Malnutrition of moderate degree 11/04/2020   Tracheostomy care (HCC)    Pressure injury of skin 10/23/2020   Intracranial hemorrhage (HCC)    Atrial fibrillation (HCC)    Prediabetes    Leukocytosis    Acute blood loss anemia    Hypernatremia    ICH (intracerebral hemorrhage) (HCC) 09/25/2020   Unilateral primary osteoarthritis, left knee 09/26/2016   Atrial flutter (HCC) 09/25/2013   Near syncope 09/25/2013    Janann Colonel, [name] 01/26/2021, 1:40 PM  [facility] [address] [place], [place], [address] Phone: [phone]   Fax:  [phone]   Name: Walter [name] MRN: [id number] Date of Birth: [birth year]

## 2021-01-26 NOTE — Therapy (Signed)
[facility] [address] [place], [place], [address] Phone: [phone]   Fax:  [phone]  Physical Therapy Treatment  Patient Details  Name: Walter [name] MRN: [id number] Date of Birth: 10-25-1955 No data recorded  Encounter Date: 01/26/2021   PT End of Session - 01/26/21 1116     Visit Number 7    Number of Visits 17    Date for PT Re-Evaluation 02/20/21    Authorization Type Medicare 12/26/20 to 02/20/21    Progress Note Due on Visit 10    PT Start Time 1100    PT Stop Time 1145    PT Time Calculation (min) 45 min    Equipment Utilized During Treatment Gait belt    Activity Tolerance Patient tolerated treatment well    Behavior During Therapy Impulsive             Past Medical History:  Diagnosis Date   Atrial flutter [facility])    s/p ablation   DVT (deep venous thrombosis) (HCC)    Near syncope 09/25/2013   Paroxysmal atrial fibrillation (HCC)    Stroke (HCC)    initially hemorragic (not on OAC) followed by subsequent embolic stroke    Past Surgical History:  Procedure Laterality Date   A-FLUTTER ABLATION N/A 03/09/2020   Procedure: A-FLUTTER ABLATION;  Surgeon: Hillis Range, MD;  [facility]: [facility] INVASIVE CV LAB;  Service: Cardiovascular;  Laterality: N/A;   CARDIOVERSION N/A 09/27/2013   Procedure: CARDIOVERSION;  Surgeon: Lewayne Bunting, MD;  Location: [facility] ENDOSCOPY;  Service: Cardiovascular;  Laterality: N/A;   IR ANGIO EXTERNAL CAROTID SEL EXT CAROTID UNI R MOD SED  09/26/2020   IR ANGIO INTRA EXTRACRAN SEL COM CAROTID INNOMINATE BILAT MOD SED  09/26/2020   IR ANGIO VERTEBRAL SEL VERTEBRAL UNI R MOD SED  09/26/2020   IR IVC FILTER PLMT / S&I /IMG GUID/MOD SED  10/08/2020   IR IVUS EACH ADDITIONAL NON CORONARY VESSEL  11/02/2020   IR PTA VENOUS EXCEPT DIALYSIS CIRCUIT  11/02/2020   IR RADIOLOGIST EVAL & MGMT  01/17/2021   IR THROMBECT VENO MECH MOD SED  10/27/2020   IR THROMBECT VENO MECH MOD SED  11/02/2020    IR US GUIDE VASC ACCESS LEFT  10/27/2020   IR US GUIDE VASC ACCESS RIGHT  09/26/2020   IR US GUIDE VASC ACCESS RIGHT  11/02/2020   IR VENO/EXT/UNI RIGHT  11/02/2020   IR VENOCAVAGRAM IVC  10/27/2020   RETINAL DETACHMENT SURGERY     right knee arthroscopy     TEE WITHOUT CARDIOVERSION N/A 09/27/2013   Procedure: TRANSESOPHAGEAL ECHOCARDIOGRAM (TEE);  Surgeon: Lewayne Bunting, MD;  Location: [facility] ENDOSCOPY;  Service: Cardiovascular;  Laterality: N/A;   WRIST SURGERY      There were no vitals filed for this visit.   Subjective Assessment - 01/26/21 1116     Subjective Pt states he hasn't had time to do exercises. Pt states he's been doing adaptive yoga. Pt has movement therapy (focusing on slow movements). Pt reports walking down the steps and walked on the street with the walker.                     Daughter Samara Deist present during session today. Pt reported that he mostly spends time in the wheelchair. Pt and daughter educated that he should be using walker to ambulate within home with supervision to improve his function. HE should not be using wheelchair at home if someone is present.  Sit to stand: no HHA: 10x cues for nose over toes, pt educated that at home he should always use hands to get up and down. But he should alsoways look behind to make sure chair or bed is behind him and reach for handles or edge of bed before he sits down for safety Standing deadlift: 10lbs: 10x Gait training: no AD, mod A from PT and holding one hand, retropulsion noted and required mod A during gait, inconsistent step length. Standing normal BOS: head turns: 10x Horizontal, min to mod A Standing normal BOS; bil OH flexion with ball and tracking with vertical head turns: 10x Gait training: 1 x 115' with RW and min A Gait training: 1 x 65' with daughter and RW to train daughter on which side to stay on (pt's left side) Daughter educated to make sure someone is always there providing CGA when he is  walking with walker at home. He can use wheelchair for community until his walking improves. He can use wheelchair at night for safety.                              Plan - 01/26/21 1200     Clinical Impression Statement Today's session focused on patient and family education for improving safety with transfers and ambulation. Pt educated to walk more as he reported that he mainly uses wheelchair mobility at home and in community currently. Pt continues to have retropulsion during sit to stand and transfers which affects his safety with both. Currently needs min to mod A for safety with general mobility             Patient will benefit from skilled therapeutic intervention in order to improve the following deficits and impairments:     Visit Diagnosis: Other lack of coordination  Muscle weakness (generalized)  Other disturbances of skin sensation  Ataxia  Unsteadiness on feet  Ataxic gait     Problem List Patient Active Problem List   Diagnosis Date Noted   Thrombosis    Sleep disturbance    Dysphagia, post-stroke    PAF (paroxysmal atrial fibrillation) (HCC)    Acute pulmonary embolism without acute cor pulmonale (HCC)    Malnutrition of moderate degree 11/04/2020   Tracheostomy care (HCC)    Pressure injury of skin 10/23/2020   Intracranial hemorrhage (HCC)    Atrial fibrillation (HCC)    Prediabetes    Leukocytosis    Acute blood loss anemia    Hypernatremia    ICH (intracerebral hemorrhage) (HCC) 09/25/2020   Unilateral primary osteoarthritis, left knee 09/26/2016   Atrial flutter (HCC) 09/25/2013   Near syncope 09/25/2013    Walter [name] 01/26/2021, 12:01 PM  [facility] [address] [place], [place], [address] Phone: [phone]   Fax:  [phone]  Name: Walter [name] MRN: [id number] Date of Birth: March 21, 1956

## 2021-01-26 NOTE — Patient Instructions (Signed)
    ROM: Abduction - SITTING   Holding wand with left hand palm up, push wand directly out to side, leading with other hand palm down, until stretch is felt. Hold 5 seconds. Repeat 10 times per set. Do 1-2 sessions per day.    Cane Overhead - SITTING   With arms straight, hold cane forward at waist. Raise cane TO SHOULDER HEIGHT Hold 3 seconds. Repeat 10 times. Do 2 times per day.  Repeat for chest press in seated 10 reps 1-2 x day.  C

## 2021-01-29 ENCOUNTER — Other Ambulatory Visit: Payer: Self-pay | Admitting: Neurology

## 2021-01-29 ENCOUNTER — Other Ambulatory Visit: Payer: Self-pay | Admitting: *Deleted

## 2021-01-29 MED ORDER — MIRTAZAPINE 7.5 MG PO TABS: 7.5000 mg | ORAL_TABLET | Freq: Every day | ORAL | 1 refills | Status: DC

## 2021-01-30 ENCOUNTER — Other Ambulatory Visit: Payer: Self-pay

## 2021-01-30 ENCOUNTER — Ambulatory Visit: Payer: Medicare Other

## 2021-01-30 ENCOUNTER — Ambulatory Visit: Payer: Medicare Other | Attending: Physical Medicine & Rehabilitation

## 2021-01-30 ENCOUNTER — Ambulatory Visit: Payer: Medicare Other | Admitting: Occupational Therapy

## 2021-01-30 DIAGNOSIS — I69218 Other symptoms and signs involving cognitive functions following other nontraumatic intracranial hemorrhage: Secondary | ICD-10-CM | POA: Insufficient documentation

## 2021-01-30 DIAGNOSIS — R208 Other disturbances of skin sensation: Secondary | ICD-10-CM

## 2021-01-30 DIAGNOSIS — R471 Dysarthria and anarthria: Secondary | ICD-10-CM

## 2021-01-30 DIAGNOSIS — R260 Ataxic gait: Secondary | ICD-10-CM | POA: Diagnosis present

## 2021-01-30 DIAGNOSIS — M6281 Muscle weakness (generalized): Secondary | ICD-10-CM

## 2021-01-30 DIAGNOSIS — R41844 Frontal lobe and executive function deficit: Secondary | ICD-10-CM | POA: Insufficient documentation

## 2021-01-30 DIAGNOSIS — R41842 Visuospatial deficit: Secondary | ICD-10-CM | POA: Insufficient documentation

## 2021-01-30 DIAGNOSIS — R41841 Cognitive communication deficit: Secondary | ICD-10-CM | POA: Diagnosis present

## 2021-01-30 DIAGNOSIS — R2681 Unsteadiness on feet: Secondary | ICD-10-CM

## 2021-01-30 DIAGNOSIS — R270 Ataxia, unspecified: Secondary | ICD-10-CM | POA: Insufficient documentation

## 2021-01-30 DIAGNOSIS — R2689 Other abnormalities of gait and mobility: Secondary | ICD-10-CM | POA: Insufficient documentation

## 2021-01-30 DIAGNOSIS — I639 Cerebral infarction, unspecified: Secondary | ICD-10-CM | POA: Insufficient documentation

## 2021-01-30 DIAGNOSIS — R278 Other lack of coordination: Secondary | ICD-10-CM | POA: Insufficient documentation

## 2021-01-30 NOTE — Therapy (Signed)
Date of Birth: Nov 29, 1955 OUTPATIENT PHYSICAL THERAPY TREATMENT NOTE   Patient Name: Walter [name] MRN: [id number] DOB:1956-06-13, 65 y.o., male Today's Date: 01/30/2021  PCP: Clovis Riley, L.August Saucer, MD REFERRING PROVIDER: Erick Colace, MD   PT End of Session - 01/30/21 1336     Visit Number 8    Number of Visits 17    Date for PT Re-Evaluation 02/20/21    Authorization Type Medicare 12/26/20 to 02/20/21    Progress Note Due on Visit 10    PT Start Time 1330    PT Stop Time 1415    PT Time Calculation (min) 45 min    Equipment Utilized During Treatment Gait belt    Activity Tolerance Patient tolerated treatment well    Behavior During Therapy Impulsive              Past Medical History:  Diagnosis Date   Atrial flutter (HCC)    s/p ablation   DVT (deep venous thrombosis) (HCC)    Near syncope 09/25/2013   Paroxysmal atrial fibrillation (HCC)    Stroke [facility])    initially hemorragic (not on OAC) followed by subsequent embolic stroke   Past Surgical History:  Procedure Laterality Date   A-FLUTTER ABLATION N/A 03/09/2020   Procedure: A-FLUTTER ABLATION;  Surgeon: Hillis Range, MD;  Location: [facility] INVASIVE CV LAB;  Service: Cardiovascular;  Laterality: N/A;   CARDIOVERSION N/A 09/27/2013   Procedure: CARDIOVERSION;  Surgeon: Lewayne Bunting, MD;  Location: [facility] ENDOSCOPY;  Service: Cardiovascular;  Laterality: N/A;   IR ANGIO EXTERNAL CAROTID SEL EXT CAROTID UNI R MOD SED  09/26/2020   IR ANGIO INTRA EXTRACRAN SEL COM CAROTID INNOMINATE BILAT MOD SED  09/26/2020   IR ANGIO VERTEBRAL SEL VERTEBRAL UNI R MOD SED  09/26/2020   IR IVC FILTER PLMT / S&I /IMG GUID/MOD SED  10/08/2020   IR IVUS EACH ADDITIONAL NON CORONARY VESSEL  11/02/2020   IR PTA VENOUS EXCEPT DIALYSIS CIRCUIT  11/02/2020   IR RADIOLOGIST EVAL & MGMT  01/17/2021   IR THROMBECT VENO MECH MOD SED  10/27/2020   IR THROMBECT VENO MECH MOD SED  11/02/2020   IR US GUIDE VASC ACCESS LEFT  10/27/2020   IR US GUIDE VASC ACCESS  RIGHT  09/26/2020   IR US GUIDE VASC ACCESS RIGHT  11/02/2020   IR VENO/EXT/UNI RIGHT  11/02/2020   IR VENOCAVAGRAM IVC  10/27/2020   RETINAL DETACHMENT SURGERY     right knee arthroscopy     TEE WITHOUT CARDIOVERSION N/A 09/27/2013   Procedure: TRANSESOPHAGEAL ECHOCARDIOGRAM (TEE);  Surgeon: Lewayne Bunting, MD;  Location: [facility] ENDOSCOPY;  Service: Cardiovascular;  Laterality: N/A;   WRIST SURGERY     Patient Active Problem List   Diagnosis Date Noted   Thrombosis    Sleep disturbance    Dysphagia, post-stroke    PAF (paroxysmal atrial fibrillation) (HCC)    Acute pulmonary embolism without acute cor pulmonale (HCC)    Malnutrition of moderate degree 11/04/2020   Tracheostomy care (HCC)    Pressure injury of skin 10/23/2020   Intracranial hemorrhage (HCC)    Atrial fibrillation (HCC)    Prediabetes    Leukocytosis    Acute blood loss anemia    Hypernatremia    ICH (intracerebral hemorrhage) (HCC) 09/25/2020   Unilateral primary osteoarthritis, left knee 09/26/2016   Atrial flutter (HCC) 09/25/2013   Near syncope 09/25/2013    REFERRING DIAG: I61.3 (ICD-10-CM) - 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 (HCC) I69.193 (ICD-10-CM) -  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 Decreased, improved ventricle size since 10/06/2020 with stable small volume of IVH. 2. Hematoma and edema along the right superior approach EVD tract is stable, including a small volume of bilateral SAH. 3. Expected evolution of the left midbrain/thalamic hemorrhage and edema. 4. Small additional scattered subacute infarcts remain largely occult by CT. 5. No new intracranial abnormality identified.     IMPRESSION: 1. Similar appearance of intraventricular hemorrhage as well as hemorrhage along prior EVD tract in the right frontal lobe  and scattered cortical subarachnoid hemorrhage. 2. Ventricular size remains stable. 3. No significant change in size of intraparenchymal hemorrhage centered within the left side of the midbrain extending into the thalamic pulvinar and superior cerebellar peduncle. 4. A few small foci of restricted diffusion within the left frontal and parietal lobes, corresponding to small acute/subacute infarcts  PRECAUTIONS: fall  SUBJECTIVE: Pt reports he walked 0.5 mile before coming to rehab today.   Objective: See treatment notes  Sit<>stand CGA for safety (very fast/impulsive)  Stand pivot t/f min/CGA  Scooting/squat pivot t/f CGA   TODAY'S TREATMENT: 01/30/21: Gait training: 1 x 230 feet with RW, one sitting break, cues for looking down at his feet, smaller steps, keeping wider BOS Min A required due to occaisonal LOB Sit to stand: 15lbs 2 x 5 with 5 sec hold for balance Standing with normal BOS: passing 6lb ball around body: 5x cw, 5x ccw; standing with narrow BOS: 5x cw, 5x ccw with 6lb ball around body Walking in parallel bars with R UE use: 10 x 10 feet fwd and bwd, used center separator board for last 5 reps to prevent legs crossing over. Cues for taking smaller steps, min A required.  01/24/21 Standing normal BOS: EO: 30 sec Standing normal BOS: EC: 1 min Standing normal BOS: EO, horizontal head turns: 20x Standing deadlifts with 15lb KB from floor: 10x, pt demo decreased WB to L LE Standing weight shifts: lateral and anterior/posterior: 20x Sit to stand: 15lb KB in hands: from standard chair, tactile and verbal cues for "nose over toes" to prevent retropulsion. Standing DOT taps: 10x L only with 2lbs at ankle to improve coordination, 10x alternating with R and L, required min to mod A for balance without HHA Standing normal BOS: bil shoulder flexion holding ball in hand: 10x; big circles: 10x cw, 10x ccw, cues to improve lateral reaching to improve trunk rotation when performing  circles. Fwd step ups 6" box 10x R and L with bil UE support    01/18/21 Sit<>stand x10  Seated heel slide with washrag x10  Standing heel slide forward with washrag 3x10 with targets  Standing heel slide to the side with washrag 3x10 with targets  Standing step forward and back on to targets 3x10  Standing balance with alternating UE raises 2x10  In // bars: forward stepping, pt cued to step on targets and "slide" foot to keep from L LE marching x 6 trips  Amb around gym 2x115' with tactile cues to keep from excessive L hip flexion, v/cs to "slide" L foot forward and slow down steps    01/16/21  Latissimus press 15x  Seated FAQs 2# alt 15x w/lat press  Seated marching 2# alt 15x w/lat press  Seated heel slides using towel 15x per side  Seated core exercises of hip and shoulder tosses, chops  and Vs with 2.2# ball, 10x ea.   Seated OH lift with inspiration using 2.2# ball, 10x   Seated forward flexion with HHA from PT 10x  5xSTS with CGA diminishing to SBA  Lateral reaches over red physioball 10x ea.   01/08/21  Supine bridge, alternating marching, hip fallouts, alternating UE F/E all 2x15  Seated LAQs, marching, 2x15 performed with lat press    01/03/21 BERG score 11/56, 5x STS 35.9s with CGA Balance training in //bars, fwd and sidestep 10x per LE, marching alt pattern and heel/toe raises Stepping tasks fwd using 4" block, 10x per LE with fwd WS in // bars Latissimus press with deep inspiration 10 reps  Tapping floor targets, single target in center 10x alt in // bars 2 targets lateral of center, 10x alt in //bars    PATIENT EDUCATION: Education details: sit to stand instructions for safety Person educated: Patient and Spouse Education method: Explanation, Demonstration, and Verbal cues Education comprehension: verbalized understanding     HOME EXERCISE PROGRAM: Step/tapping on targets at home  ASSESSMENT:   CLINICAL IMPRESSION: Pt needs cueing for safe transfers and  ambulation. Pt will continue to benefit from skilled PT to improve gait, balance and safety awareness.  REHAB POTENTIAL: Good   CLINICAL DECISION MAKING: Stable/uncomplicated GOALS: Goals reviewed with patient? Yes   SHORT TERM GOALS:   STG Name Target Date Goal status  1 Patient will be able to perform chair to chair transfer with CGA/SBA with use of RW for safety to improve independence. Baseline: min to mod A (12/26/20) 01/23/2021 01/03/21 MinA/CGA to transfer in/out of WC  2 Pt will be able to ambulate 115' with RW with CGA to min A with RW for safety to improve independence Baseline: min to mod A with RW 145' 01/23/2021 INITIAL  3 Pt will be able to maintain standing balance for 30 sec to improve static balance Baseline: 10 sec before needing help due to LOB (12/26/20) 01/23/2021 01/03/21 Able to stand 2 min with CGA    LONG TERM GOALS:   LTG Name Target Date Goal status  1 Pt will demo >5 points improvement on Berg Balance Scale to improve static balance Baseline: 11/56 01/03/21 02/20/2021 INITIAL  2 Pt will be able to ambulate >500 feet with RW and SBA to improve walking endurance and safety. Baseline: 145' with RW min to mod A 02/20/2021 INITIAL  3 Pt will be able to perform gait speed improvement by 0.29m/s to improve community ambulation. Baseline:TBD 02/20/2021 INITIAL  4 Pt will demo 5 sec improvement in 5x sit to stand (with or without UE support) to improve functional strength Baseline: TBD 02/20/2021 INITIAL    PLAN: PT FREQUENCY: 2x/week   PT DURATION: 8 weeks   PLANNED INTERVENTIONS: Therapeutic exercises, Therapeutic activity, Neuro Muscular re-education, Balance training, Gait training, Patient/Family education, Joint mobilization, Stair training, Visual/preceptual remediation/compensation, Wheelchair mobility training, Cryotherapy, Moist heat, and Manual therapy   PLAN FOR NEXT SESSION: Standing tasks in // bars, floor targets and stepping tasks, Scifit?, STS and pivots      Patient will benefit from skilled therapeutic intervention in order to improve the following deficits and impairments:     Visit Diagnosis: Muscle weakness (generalized)  Ataxia  Unsteadiness on feet  Ataxic gait     Problem List Patient Active Problem List   Diagnosis Date Noted   Thrombosis    Sleep disturbance    Dysphagia, post-stroke    PAF (paroxysmal atrial fibrillation) (HCC)    Acute  pulmonary embolism without acute cor pulmonale (HCC)    Malnutrition of moderate degree 11/04/2020   Tracheostomy care (HCC)    Pressure injury of skin 10/23/2020   Intracranial hemorrhage (HCC)    Atrial fibrillation (HCC)    Prediabetes    Leukocytosis    Acute blood loss anemia    Hypernatremia    ICH (intracerebral hemorrhage) (HCC) 09/25/2020   Unilateral primary osteoarthritis, left knee 09/26/2016   Atrial flutter (HCC) 09/25/2013   Near syncope 09/25/2013    [name] PT, DPT 01/30/2021, 1:47 PM  [facility] [address] [place], [place], [address] Phone: [phone]   Fax:  [phone]  Name: Walter [name] MRN: [id number] Date of Birth: [birth year]

## 2021-01-30 NOTE — Patient Instructions (Signed)
Flexor Tendon Gliding (Active Hook Fist)   With fingers and knuckles straight, bend middle and tip joints. Do not bend large knuckles. Repeat _10-15___ times. Do _4-6___ sessions per day.  MP Flexion (Active)   With back of hand on table, bend large knuckles as far as they will go, keeping small joints straight. Repeat _10-15___ times. Do __4-6__ sessions per day. Activity: Reach into a narrow container.*      Finger Flexion / Extension   With palm up, bend fingers of left hand toward palm, making a  fist. Straighten fingers, opening fist. Repeat sequence _10-15___ times per session. Do _4-6__ sessions per day.  Opposition (Active)   Touch tip of thumb to nail tip of each finger in turn, making an "O" shape. Repeat __10__ times. Do _4-6___ sessions per day. Finger Abduction / Adduction    Spread fingers of right hand. Then bring together. Keep fingers straight. Repeat sequence __10__ times per session. Do ___7_ sessions per week.   Copyright  VHI. All rights reserved.

## 2021-01-30 NOTE — Therapy (Signed)
[facility] [address] [place], [place], [address] Phone: [phone]   Fax:  [phone]  Speech Language Pathology Treatment  Patient Details  Name: Walter [name] MRN: [id number] Date of Birth: 1956-03-07 Referring Provider (SLP): Charlett Blake, MD   Encounter Date: 01/30/2021   End of Session - 01/30/21 1724     Visit Number 9    Number of Visits 17    Date for SLP Re-Evaluation 02/21/21    Authorization Type Medicare    SLP Start Time 1450    SLP Stop Time  3016    SLP Time Calculation (min) 40 min    Activity Tolerance Patient tolerated treatment well             Past Medical History:  Diagnosis Date   Atrial flutter ([place])    s/p ablation   DVT (deep venous thrombosis) ([place])    Near syncope 09/25/2013   Paroxysmal atrial fibrillation ([place])    Stroke [facility])    initially hemorragic (not on [facility]) followed by subsequent embolic stroke    Past Surgical History:  Procedure Laterality Date   A-FLUTTER ABLATION N/A 03/09/2020   Procedure: A-FLUTTER ABLATION;  Surgeon: Thompson Grayer, MD;  Location: [facility] CV LAB;  Service: Cardiovascular;  Laterality: N/A;   CARDIOVERSION N/A 09/27/2013   Procedure: CARDIOVERSION;  Surgeon: Lelon Perla, MD;  Location: [facility] ENDOSCOPY;  Service: Cardiovascular;  Laterality: N/A;   IR ANGIO EXTERNAL CAROTID SEL EXT CAROTID UNI R MOD SED  09/26/2020   IR ANGIO INTRA EXTRACRAN SEL COM CAROTID INNOMINATE BILAT MOD SED  09/26/2020   IR ANGIO VERTEBRAL SEL VERTEBRAL UNI R MOD SED  09/26/2020   IR IVC FILTER PLMT / S&I /IMG GUID/MOD SED  10/08/2020   IR IVUS EACH ADDITIONAL NON CORONARY VESSEL  11/02/2020   IR PTA VENOUS EXCEPT DIALYSIS CIRCUIT  11/02/2020   IR RADIOLOGIST EVAL & MGMT  01/17/2021   IR THROMBECT VENO MECH MOD SED  10/27/2020   IR THROMBECT VENO MECH MOD SED  11/02/2020   IR US GUIDE VASC ACCESS LEFT  10/27/2020   IR US GUIDE VASC ACCESS RIGHT  09/26/2020   IR US  GUIDE VASC ACCESS RIGHT  11/02/2020   IR VENO/EXT/UNI RIGHT  11/02/2020   IR VENOCAVAGRAM IVC  10/27/2020   RETINAL DETACHMENT SURGERY     right knee arthroscopy     TEE WITHOUT CARDIOVERSION N/A 09/27/2013   Procedure: TRANSESOPHAGEAL ECHOCARDIOGRAM (TEE);  Surgeon: Lelon Perla, MD;  Location: [facility] ENDOSCOPY;  Service: Cardiovascular;  Laterality: N/A;   WRIST SURGERY      There were no vitals filed for this visit.   Subjective Assessment - 01/30/21 1456     Subjective Pt wife arrives with pt for ST today;inquired about x3/week tx.    Patient is accompained by: Family member   wife, cathy   Currently in Pain? No/denies                   ADULT SLP TREATMENT - 01/30/21 1706       General Information   Behavior/Cognition Alert;Cooperative;Pleasant mood;Requires cueing      Treatment Provided   Treatment provided Cognitive-Linquistic      Cognitive-Linquistic Treatment   Treatment focused on Cognition;Dysarthria;Patient/family/caregiver education    Skilled Treatment Wife came with pt today - SLP reiterated teh importance of having someone with pt for all ST sessions due to carryover difficult with pt not with full attention  ability. SLP reviewed pt's abdominal breathing (AB) and pt again needed to look at hand for success with AB. SLP attempted to have pt use AB and loud effortful voice with his everyday phrases but pt was unable to multi-task both AB and loudness so reverted to chest/clavicular breathing. SLP highlighted this difficulty with multi-tasking and told pt we would focus on AB and loudness in sentences separately for the time being. SLP reviewed the three items on pt's ST homework and again encouraged pt/wife to have pt complete these every day in order to decr pt's ataxic speech. SLP worked today at [employer] /a/ as well and pt req'd usual cues for full breath prior to loud /a/- when pt did so he doubled the length of his production.     Assessment /  Recommendations / Plan   Plan Continue with current plan of care      Progression Toward Goals   Progression toward goals Progressing toward goals              SLP Education - 01/30/21 1723     Education Details see daily note    Person(s) Educated Patient;Spouse    Methods Explanation;Demonstration;Verbal cues;Handout    Comprehension Verbalized understanding;Returned demonstration;Verbal cues required              SLP Short Term Goals - 01/30/21 1725       SLP SHORT TERM GOAL #1   Title Pt will complete dysarthria HEP with occasional min A over 2 sessions    Time 1    Period Weeks    Status Not Met      SLP SHORT TERM GOAL #2   Title Pt will produce loud /a/ or "hey!" with at least high 80s dB average over 2 sessions    Time 1    Period Weeks    Status Not Met      SLP SHORT TERM GOAL #3   Title Pt will generate abdominal breathing in 16/20 answers to SLP questions over 2 sessions    Baseline 01-18-21    Time 1    Period Weeks    Status Partially Met      SLP SHORT TERM GOAL #4   Title Pt will demonstrate dysarthria compensations in 10 minute simple to mod complex conversation to be 75% intelligibile with occasional min A over 2 sessions    Time 1    Period Weeks    Status Not Met      SLP SHORT TERM GOAL #5   Title Pt will complete formal cognitive linguistic assessment in first 1-2 ST sessions    Status Achieved      SLP SHORT TERM GOAL #6   Title Pt will use memory and attention compensations for appointments, medicine management, and other daily activities with occasional min A over 2 sessions    Time 1    Period Weeks    Status Not Met              SLP Long Term Goals - 01/30/21 1726       SLP LONG TERM GOAL #1   Title Pt will complete dysarthria HEP with rare min A over 2 sessions    Time 4    Period Weeks    Status On-going      SLP LONG TERM GOAL #2   Title Pt will demonstrate dysarthria compensations in 15+ minute simple to mod  complex conversation to be 90% intelligibile with rare min  A over 2 sessions    Time 4    Period Weeks    Status On-going      SLP LONG TERM GOAL #3   Title Pt will demo improved sustained and selective attention during therapeutic tasks with less than 3 cues for active listening during 2 sessions    Time 4    Period Weeks    Status On-going      SLP LONG TERM GOAL #4   Title Pt will demonstrate emergent and anticipatory awareness of impulsivity with less than 5 cues to modify behaviors during 2 sessions    Time 4    Period Weeks    Status On-going      SLP LONG TERM GOAL #5   Title Pt will report reduced frustration and improved communication effectiveness via PROM by 2 points at last ST sessions    Baseline CES: 15.5 & V-RQOL:35    Time 4    Period Weeks    Status On-going              Plan - 01/30/21 1724     Clinical Impression Statement "[name]" presents for OPST intervention secondary to stroke in March 2022. SLP continued education and training pt and wife in breath support, and audible, intelligible speech. SLP stressed to wife how important it is for caregivers to arrive to therapy with pt for enhanced carryover of therapy tasks. Possible incr to x3/week for 4 weeks then back down to x2/week, beginning next week .IF so, will send updated POC to referring MD. Skilled ST is cont'd warranted to address dysarthria and cognition to maximize communication effectiveness and optimize functional independence and safety at home.    Speech Therapy Frequency 2x / week    Duration 8 weeks    Treatment/Interventions Cueing hierarchy;Functional tasks;Patient/family education;Cognitive reorganization;Multimodal communcation approach;Language facilitation;Compensatory techniques;Internal/external aids;SLP instruction and feedback    Potential to [place] provided    Consulted and Agree with Plan of Care Patient;Family member/caregiver              Patient will benefit from skilled therapeutic intervention in order to improve the following deficits and impairments:   Dysarthria and anarthria  Cognitive communication deficit    Problem List Patient Active Problem List   Diagnosis Date Noted   Thrombosis    Sleep disturbance    Dysphagia, post-stroke    PAF (paroxysmal atrial fibrillation) ([place])    Acute pulmonary embolism without acute cor pulmonale (H[facility])    Malnutrition of moderate degree 11/04/2020   Tracheostomy care ([place])    Pressure injury of skin 10/23/2020   Intracranial hemorrhage ([facility])    Atrial fibrillation ([employer])    Prediabetes    Leukocytosis    Acute blood loss anemia    Hypernatremia    ICH (intracerebral hemorrhage) ([place]) 09/25/2020   Unilateral primary osteoarthritis, left knee 09/26/2016   Atrial flutter ([place]) 09/25/2013   Near syncope 09/25/2013    [facility]. ,[place], CCC-SLP  01/30/2021, 5:27 PM  [facility] [address] [place], [place], [address] Phone: [phone]   Fax:  [phone]   Name: Walter [name] MRN: [id number] Date of Birth: 08/14/1955

## 2021-01-30 NOTE — Therapy (Signed)
[facility] [address] [place], [place], [address] Phone: [phone]   Fax:  [phone]  Occupational Therapy Treatment  Patient Details  Name: Walter [name] MRN: [id number] Date of Birth: Feb 10, 1956 Referring Provider (OT): Dr. Claudette Laws   Encounter Date: 01/30/2021   OT End of Session - 01/30/21 1622     Visit Number 6    Number of Visits 17    Date for OT Re-Evaluation 03/17/21    Authorization Type Medicare Part A & B    Authorization - Visit Number 6    Authorization - Number of Visits 10    Progress Note Due on Visit 10    OT Start Time 1405    OT Stop Time 1445    OT Time Calculation (min) 40 min    Activity Tolerance Patient tolerated treatment well    Behavior During Therapy Impulsive             Past Medical History:  Diagnosis Date   Atrial flutter ([facility])    s/p ablation   DVT (deep venous thrombosis) ([facility])    Near syncope 09/25/2013   Paroxysmal atrial fibrillation ([facility])    Stroke [facility])    initially hemorragic (not on [facility]) followed by subsequent embolic stroke    Past Surgical History:  Procedure Laterality Date   A-FLUTTER ABLATION N/A 03/09/2020   Procedure: A-FLUTTER ABLATION;  Surgeon: Hillis Range, MD;  Location: [facility] INVASIVE CV LAB;  Service: Cardiovascular;  Laterality: N/A;   CARDIOVERSION N/A 09/27/2013   Procedure: CARDIOVERSION;  Surgeon: Lewayne Bunting, MD;  Location: [facility] ENDOSCOPY;  Service: Cardiovascular;  Laterality: N/A;   IR ANGIO EXTERNAL CAROTID SEL EXT CAROTID UNI R MOD SED  09/26/2020   IR ANGIO INTRA EXTRACRAN SEL COM CAROTID INNOMINATE BILAT MOD SED  09/26/2020   IR ANGIO VERTEBRAL SEL VERTEBRAL UNI R MOD SED  09/26/2020   IR IVC FILTER PLMT / S&I /IMG GUID/MOD SED  10/08/2020   IR IVUS EACH ADDITIONAL NON CORONARY VESSEL  11/02/2020   IR PTA VENOUS EXCEPT DIALYSIS CIRCUIT  11/02/2020   IR RADIOLOGIST EVAL & MGMT  01/17/2021   IR THROMBECT VENO MECH MOD SED   10/27/2020   IR THROMBECT VENO MECH MOD SED  11/02/2020   IR US GUIDE VASC ACCESS LEFT  10/27/2020   IR US GUIDE VASC ACCESS RIGHT  09/26/2020   IR US GUIDE VASC ACCESS RIGHT  11/02/2020   IR VENO/EXT/UNI RIGHT  11/02/2020   IR VENOCAVAGRAM IVC  10/27/2020   RETINAL DETACHMENT SURGERY     right knee arthroscopy     TEE WITHOUT CARDIOVERSION N/A 09/27/2013   Procedure: TRANSESOPHAGEAL ECHOCARDIOGRAM (TEE);  Surgeon: Lewayne Bunting, MD;  Location: [facility] ENDOSCOPY;  Service: Cardiovascular;  Laterality: N/A;   WRIST SURGERY      There were no vitals filed for this visit.   Subjective Assessment - 01/30/21 1613     Subjective  [name] is interested in pt having more therapy, discussed possiblity of increasing frequency to 3x week    Pertinent History ICH of brainstem (complicated by hydrocephalus, DVTs, and tracheostomy).  PMH:  a-fib    Limitations fall risk, ataxia, impulsivity    Patient Stated Goals use L side of body better (decrease ataxia), stay active and heal brain    Currently in Pain? No/denies  Treatment: discussion with pt/ wife about importance of pt participation in ADLS, while caregiver assists for balance and safety. Pt dons/ doffs shoes mod I for slip on, needs practice for tying shoes.      OT Education - 01/30/21 1618     Education Details Cane exercises reviewed, 10 reps each,  pt/ wife were educated and pt returned demonstration. Therapist issued A/ROM tendon gliding exercises for RUE. Therapist recommended pt has hired caregiver come in for education    Person(s) Educated Patient;Spouse    Methods Explanation;Verbal cues;Demonstration;Tactile cues;Handout    Comprehension Verbalized understanding;Returned demonstration;Verbal cues required              OT Short Term Goals - 01/30/21 1614       OT SHORT TERM GOAL #1   Title Pt/caregiver will be independent with initial HEP for LUE coordination and R hand  strength.--check STGs 02/15/21    Time 4    Period Weeks    Status On-going      OT SHORT TERM GOAL #2   Title Pt/caregiver will be independent with vision HEP and visual compensation strategies.    Time 4    Period Weeks    Status On-going      OT SHORT TERM GOAL #3   Title Pt will improve LUE coordination for ADLs as shown by improving score on box and blocks test by at least 6.    Baseline L-21 blocks    Time 4    Period Weeks    Status On-going      OT SHORT TERM GOAL #4   Title Pt will perform simple snack prep/home maintenance task from w/c level mod I.    Time 4    Period Weeks    Status On-going      OT SHORT TERM GOAL #5   Title Pt will verbalize understanding of compensation strategies for sensory deficts for incr safety.    Time 4    Period Weeks    Status On-going      OT SHORT TERM GOAL #6   Title Pt will improve R hand grip stength to at least 32lbs to assist with ADLs/opening containers.    Time 4    Period Weeks    Status On-going               OT Long Term Goals - 01/30/21 1615       OT LONG TERM GOAL #1   Title Pt will be independent with updated HEP--check LTGs 03/18/21    Time 8    Period Weeks    Status New      OT LONG TERM GOAL #2   Title Pt will improve L hand coordination for ADLs as shown by completing 9-hole peg test in less than 70sec.    Baseline 107sec    Time 8    Period Weeks    Status New      OT LONG TERM GOAL #3   Title Pt will be mod I with toileting.    Time 8    Period Weeks    Status New      OT LONG TERM GOAL #4   Title Pt will perform simple snack prep/home maintenance tasks in standing with supervision.    Time 8    Period Weeks    Status New      OT LONG TERM GOAL #5   Title Pt will demo good safety awareness/compensation for sensory deficits and attention  to LUE during functional tasks and transfers without cueing.    Time 8    Period Weeks    Status New      OT LONG TERM GOAL #6   Title Pt will  perform simple environmental scanning/navigation with at least 90% accuracy for incr safety.    Time 8    Period Weeks    Status New                   Plan - 01/30/21 1616     Clinical Impression Statement [name] was present for therapy today. Therapist  discussed the possibility of increasing frequency to 3x week short term. will discuss further next visit.    OT Occupational Profile and History Detailed Assessment- Review of Records and additional review of physical, cognitive, psychosocial history related to current functional performance    Occupational performance deficits (Please refer to evaluation for details): ADL's;IADL's;Work;Leisure;Social Participation    Body Structure / Function / Physical Skills ADL;Strength;Balance;Proprioception;UE functional use;IADL;Endurance;Vision;Mobility;Coordination;Decreased knowledge of precautions;FMC;GMC;Sensation    Cognitive Skills Attention;Safety Awareness;Memory;Perception;Problem Solve    Rehab Potential Good    Clinical Decision Making Several treatment options, min-mod task modification necessary    Comorbidities Affecting Occupational Performance: May have comorbidities impacting occupational performance    Modification or Assistance to Complete Evaluation  Min-Moderate modification of tasks or assist with assess necessary to complete eval    OT Frequency 2x / week    OT Duration 8 weeks   +eval   OT Treatment/Interventions Self-care/ADL training;Moist Heat;Fluidtherapy;DME and/or AE instruction;Balance training;Therapeutic activities;Aquatic Therapy;Ultrasound;Therapeutic exercise;Cognitive remediation/compensation;Visual/perceptual remediation/compensation;Functional Mobility Training;Neuromuscular education;Cryotherapy;Energy conservation;Manual Therapy;Patient/family education    Plan check short term goals, renew in next several visits to increase frequency.    Consulted and Agree with Plan of Care Family  member/caregiver;Patient    Family Member Consulted wife             Patient will benefit from skilled therapeutic intervention in order to improve the following deficits and impairments:   Body Structure / Function / Physical Skills: ADL, Strength, Balance, Proprioception, UE functional use, IADL, Endurance, Vision, Mobility, Coordination, Decreased knowledge of precautions, FMC, GMC, Sensation Cognitive Skills: Attention, Safety Awareness, Memory, Perception, Problem Solve     Visit Diagnosis: Muscle weakness (generalized)  Ataxia  Unsteadiness on feet  Other lack of coordination  Other disturbances of skin sensation  Visuospatial deficit  Frontal lobe and executive function deficit  Other symptoms and signs involving cognitive functions following other nontraumatic intracranial hemorrhage    Problem List Patient Active Problem List   Diagnosis Date Noted   Thrombosis    Sleep disturbance    Dysphagia, post-stroke    PAF (paroxysmal atrial fibrillation) ([facility])    Acute pulmonary embolism without acute cor pulmonale (HCC)    Malnutrition of moderate degree 11/04/2020   Tracheostomy care ([facility])    Pressure injury of skin 10/23/2020   Intracranial hemorrhage ([facility])    Atrial fibrillation (H[facility])    Prediabetes    Leukocytosis    Acute blood loss anemia    Hypernatremia    ICH (intracerebral hemorrhage) ([facility]) 09/25/2020   Unilateral primary osteoarthritis, left knee 09/26/2016   Atrial flutter ([facility]) 09/25/2013   Near syncope 09/25/2013    Aveion Nguyen 01/30/2021, 4:23 PM  [facility] [address] [place], [place], [address] Phone: [phone]   Fax:  [phone]  Name: Walter [name] MRN: [id number] Date of Birth: [birth year]

## 2021-01-31 ENCOUNTER — Telehealth: Payer: Self-pay | Admitting: *Deleted

## 2021-01-31 NOTE — Telephone Encounter (Signed)
PA for mirtazapine (quantity override case) started on covermymeds (key:  [id number]). Pt has pharmacy coverage through [employer] [phone]). Approved through 06/30/2021.

## 2021-01-31 NOTE — Telephone Encounter (Signed)
He has appt with you on 02/15/21. Do you mean get him in earlier if possible?

## 2021-02-02 ENCOUNTER — Ambulatory Visit: Payer: Medicare Other

## 2021-02-02 ENCOUNTER — Other Ambulatory Visit: Payer: Self-pay

## 2021-02-02 ENCOUNTER — Ambulatory Visit: Payer: Medicare Other | Admitting: Occupational Therapy

## 2021-02-02 DIAGNOSIS — R2681 Unsteadiness on feet: Secondary | ICD-10-CM

## 2021-02-02 DIAGNOSIS — R278 Other lack of coordination: Secondary | ICD-10-CM

## 2021-02-02 DIAGNOSIS — R471 Dysarthria and anarthria: Secondary | ICD-10-CM

## 2021-02-02 DIAGNOSIS — M6281 Muscle weakness (generalized): Secondary | ICD-10-CM

## 2021-02-02 DIAGNOSIS — R41842 Visuospatial deficit: Secondary | ICD-10-CM

## 2021-02-02 DIAGNOSIS — R41844 Frontal lobe and executive function deficit: Secondary | ICD-10-CM

## 2021-02-02 DIAGNOSIS — R41841 Cognitive communication deficit: Secondary | ICD-10-CM

## 2021-02-02 DIAGNOSIS — R270 Ataxia, unspecified: Secondary | ICD-10-CM

## 2021-02-02 DIAGNOSIS — I69218 Other symptoms and signs involving cognitive functions following other nontraumatic intracranial hemorrhage: Secondary | ICD-10-CM

## 2021-02-02 DIAGNOSIS — R208 Other disturbances of skin sensation: Secondary | ICD-10-CM

## 2021-02-02 NOTE — Therapy (Signed)
[facility] [address] [place], [place], [address] Phone: [phone]   Fax:  [phone]  Speech Language Pathology Treatment/ Medicare progress note/Renewal update  Patient Details  Name: Walter [name] MRN: [id number] Date of Birth: May 27, 1956 Referring Provider (SLP): Charlett Blake, MD   Encounter Date: 02/02/2021   End of Session - 02/02/21 1659     Visit Number 10    Number of Visits 17    Date for SLP Re-Evaluation 02/21/21    Authorization Type Medicare    SLP Start Time 1148    SLP Stop Time  1230    SLP Time Calculation (min) 42 min    Activity Tolerance Patient tolerated treatment well             Past Medical History:  Diagnosis Date   Atrial flutter ([place])    s/p ablation   DVT (deep venous thrombosis) ([place])    Near syncope 09/25/2013   Paroxysmal atrial fibrillation ([place])    Stroke [facility])    initially hemorragic (not on [facility]) followed by subsequent embolic stroke    Past Surgical History:  Procedure Laterality Date   A-FLUTTER ABLATION N/A 03/09/2020   Procedure: A-FLUTTER ABLATION;  Surgeon: Thompson Grayer, MD;  Location: [facility] CV LAB;  Service: Cardiovascular;  Laterality: N/A;   CARDIOVERSION N/A 09/27/2013   Procedure: CARDIOVERSION;  Surgeon: Lelon Perla, MD;  Location: [facility] ENDOSCOPY;  Service: Cardiovascular;  Laterality: N/A;   IR ANGIO EXTERNAL CAROTID SEL EXT CAROTID UNI R MOD SED  09/26/2020   IR ANGIO INTRA EXTRACRAN SEL COM CAROTID INNOMINATE BILAT MOD SED  09/26/2020   IR ANGIO VERTEBRAL SEL VERTEBRAL UNI R MOD SED  09/26/2020   IR IVC FILTER PLMT / S&I /IMG GUID/MOD SED  10/08/2020   IR IVUS EACH ADDITIONAL NON CORONARY VESSEL  11/02/2020   IR PTA VENOUS EXCEPT DIALYSIS CIRCUIT  11/02/2020   IR RADIOLOGIST EVAL & MGMT  01/17/2021   IR THROMBECT VENO MECH MOD SED  10/27/2020   IR THROMBECT VENO MECH MOD SED  11/02/2020   IR US GUIDE VASC ACCESS LEFT  10/27/2020   IR US  GUIDE VASC ACCESS RIGHT  09/26/2020   IR US GUIDE VASC ACCESS RIGHT  11/02/2020   IR VENO/EXT/UNI RIGHT  11/02/2020   IR VENOCAVAGRAM IVC  10/27/2020   RETINAL DETACHMENT SURGERY     right knee arthroscopy     TEE WITHOUT CARDIOVERSION N/A 09/27/2013   Procedure: TRANSESOPHAGEAL ECHOCARDIOGRAM (TEE);  Surgeon: Lelon Perla, MD;  Location: [facility] ENDOSCOPY;  Service: Cardiovascular;  Laterality: N/A;   WRIST SURGERY       Speech Therapy Progress Note  Dates of Reporting Period: 12-26-20 to present  Subjective Statement: See below  Objective: Pt is progressing slightly better with family involvement this last 7-10 days.  Goal Update: SEe below  Plan: Incr to x3/week, as pt now with family involvement.  Reason Skilled Services are Required: Pt has not reached max potential.  There were no vitals filed for this visit.   Subjective Assessment - 02/02/21 1152     Subjective Pt wife arrives with pt for ST today.    Patient is accompained by: Family member   Walter [name] - wife   Currently in Pain? No/denies                   ADULT SLP TREATMENT - 02/02/21 1155       General Information   Behavior/Cognition  Alert;Cooperative;Pleasant mood;Requires cueing      Treatment Provided   Treatment provided Cognitive-Linquistic      Cognitive-Linquistic Treatment   Treatment focused on Cognition;Dysarthria;Patient/family/caregiver education    Skilled Treatment Abdominal breathing (AB) targeted with pt today min-mod cues at first for pt to look at hand - pt demonstrated reduced sustained/selective attention as he was distracted by position of his feet- took his eyes off abdomen and moved feet, lost track of AB and never regained until SLP cue to place and look at hand again. SLP reiterated breathing is focused on AB at rest currently, and breathing before each everyday sentence but not for other speech tasks due to pt's difficulty with attention. Loud /a/ completed to incr conversational  volume with mid 80s dB with usual min-mod cues for loudness. Everyday sentences with consistent cues for breath before hand completed with mid 70s dB. Opposites produced with WNL volume, however sentence responses req'd usual min-mod A for loudness. Told Walter [name] (wife) focus on AB at rest at home for 10-15 minutes BID and total work on speech tasks for loudness 45-60 mintues/day with pt working for 15-20 minutes (longer if he can tolerate).      Assessment / Recommendations / Plan   Plan Continue with current plan of care      Progression Toward Goals   Progression toward goals Progressing toward goals                SLP Short Term Goals - 01/30/21 1725       SLP SHORT TERM GOAL #1   Title Pt will complete dysarthria HEP with occasional min A over 2 sessions    Time 1    Period Weeks    Status Not Met      SLP SHORT TERM GOAL #2   Title Pt will produce loud /a/ or "hey!" with at least high 80s dB average over 2 sessions    Time 1    Period Weeks    Status Not Met      SLP SHORT TERM GOAL #3   Title Pt will generate abdominal breathing in 16/20 answers to SLP questions over 2 sessions    Baseline 01-18-21    Time 1    Period Weeks    Status Partially Met      SLP SHORT TERM GOAL #4   Title Pt will demonstrate dysarthria compensations in 10 minute simple to mod complex conversation to be 75% intelligibile with occasional min A over 2 sessions    Time 1    Period Weeks    Status Not Met      SLP SHORT TERM GOAL #5   Title Pt will complete formal cognitive linguistic assessment in first 1-2 ST sessions    Status Achieved      SLP SHORT TERM GOAL #6   Title Pt will use memory and attention compensations for appointments, medicine management, and other daily activities with occasional min A over 2 sessions    Time 1    Period Weeks    Status Not Met              SLP Long Term Goals - 02/02/21 1701       SLP LONG TERM GOAL #1   Title Pt will complete dysarthria  HEP with rare min A over 2 sessions    Time 4    Period Weeks    Status On-going      SLP LONG TERM GOAL #2  Title Pt will demonstrate dysarthria compensations in 15+ minute simple to mod complex conversation to be 90% intelligibile with rare min A over 2 sessions    Time 4    Period Weeks    Status On-going      SLP LONG TERM GOAL #3   Title Pt will demo improved sustained and selective attention during therapeutic tasks with less than 3 cues for active listening during 2 sessions    Time 4    Period Weeks    Status On-going      SLP LONG TERM GOAL #4   Title Pt will demonstrate emergent and anticipatory awareness of impulsivity with less than 5 cues to modify behaviors during 2 sessions    Time 4    Period Weeks    Status On-going      SLP LONG TERM GOAL #5   Title Pt will report reduced frustration and improved communication effectiveness via PROM by 2 points at last ST sessions    Baseline CES: 15.5 & V-RQOL:35    Time 4    Period Weeks    Status On-going              Plan - 02/02/21 1700     Clinical Impression Statement "[name]" presents for OPST intervention secondary to stroke in March 2022. SLP continued education and training pt and wife in breath support, and audible, intelligible speech. SLP stressed to wife how important it is for caregivers to arrive to therapy with pt for enhanced carryover of therapy tasks. Pt and wife would like to incr to x3/week beginning next week .Sending updated POC to referring MD. Skilled ST is cont'd warranted to address dysarthria and cognition to maximize communication effectiveness and optimize functional independence and safety at home.    Speech Therapy Frequency 3x / week    Duration 8 weeks    Treatment/Interventions Cueing hierarchy;Functional tasks;Patient/family education;Cognitive reorganization;Multimodal communcation approach;Language facilitation;Compensatory techniques;Internal/external aids;SLP instruction and  feedback    Potential to [place] provided    Consulted and Agree with Plan of Care Patient;Family member/caregiver             Patient will benefit from skilled therapeutic intervention in order to improve the following deficits and impairments:   Dysarthria and anarthria  Cognitive communication deficit    Problem List Patient Active Problem List   Diagnosis Date Noted   Thrombosis    Sleep disturbance    Dysphagia, post-stroke    PAF (paroxysmal atrial fibrillation) ([place])    Acute pulmonary embolism without acute cor pulmonale (H[facility])    Malnutrition of moderate degree 11/04/2020   Tracheostomy care ([place])    Pressure injury of skin 10/23/2020   Intracranial hemorrhage ([facility])    Atrial fibrillation ([place])    Prediabetes    Leukocytosis    Acute blood loss anemia    Hypernatremia    ICH (intracerebral hemorrhage) ([place]) 09/25/2020   Unilateral primary osteoarthritis, left knee 09/26/2016   Atrial flutter ([place]) 09/25/2013   Near syncope 09/25/2013    [facility]. ,Beg[place], CCC-SLP  02/02/2021, 5:02 PM  [facility] [address] [place], [place], [address] Phone: [phone]   Fax:  [phone]   Name: JAIDYN KUHL MRN: [id number] Date of Birth: Jul 26, 1955

## 2021-02-02 NOTE — Therapy (Signed)
Date of Birth: 02/23/1956 OUTPATIENT PHYSICAL THERAPY TREATMENT NOTE   Patient Name: Walter [name] MRN: [id number] DOB:16-Feb-1956, 65 y.o., male Today's Date: 02/02/2021  PCP: [place] Riley, L.August Saucer, MD REFERRING PROVIDER: Erick Colace, MD   PT End of Session - 02/02/21 1118     Visit Number 9    Number of Visits 17    Date for PT Re-Evaluation 02/20/21    Authorization Type Medicare 12/26/20 to 02/20/21    Progress Note Due on Visit 10    PT Start Time 1100    PT Stop Time 1145    PT Time Calculation (min) 45 min    Equipment Utilized During Treatment Gait belt    Activity Tolerance Patient tolerated treatment well    Behavior During Therapy Impulsive              Past Medical History:  Diagnosis Date   Atrial flutter ([facility])    s/p ablation   DVT (deep venous thrombosis) ([facility])    Near syncope 09/25/2013   Paroxysmal atrial fibrillation ([facility])    Stroke [facility])    initially hemorragic (not on [facility]) followed by subsequent embolic stroke   Past Surgical History:  Procedure Laterality Date   A-FLUTTER ABLATION N/A 03/09/2020   Procedure: A-FLUTTER ABLATION;  Surgeon: Hillis Range, MD;  Location: [facility] INVASIVE CV LAB;  Service: Cardiovascular;  Laterality: N/A;   CARDIOVERSION N/A 09/27/2013   Procedure: CARDIOVERSION;  Surgeon: Lewayne Bunting, MD;  Location: [facility] ENDOSCOPY;  Service: Cardiovascular;  Laterality: N/A;   IR ANGIO EXTERNAL CAROTID SEL EXT CAROTID UNI R MOD SED  09/26/2020   IR ANGIO INTRA EXTRACRAN SEL COM CAROTID INNOMINATE BILAT MOD SED  09/26/2020   IR ANGIO VERTEBRAL SEL VERTEBRAL UNI R MOD SED  09/26/2020   IR IVC FILTER PLMT / S&I /IMG GUID/MOD SED  10/08/2020   IR IVUS EACH ADDITIONAL NON CORONARY VESSEL  11/02/2020   IR PTA VENOUS EXCEPT DIALYSIS CIRCUIT  11/02/2020   IR RADIOLOGIST EVAL & MGMT  01/17/2021   IR THROMBECT VENO MECH MOD SED  10/27/2020   IR THROMBECT VENO MECH MOD SED  11/02/2020   IR US GUIDE VASC ACCESS LEFT  10/27/2020   IR US GUIDE VASC ACCESS  RIGHT  09/26/2020   IR US GUIDE VASC ACCESS RIGHT  11/02/2020   IR VENO/EXT/UNI RIGHT  11/02/2020   IR VENOCAVAGRAM IVC  10/27/2020   RETINAL DETACHMENT SURGERY     right knee arthroscopy     TEE WITHOUT CARDIOVERSION N/A 09/27/2013   Procedure: TRANSESOPHAGEAL ECHOCARDIOGRAM (TEE);  Surgeon: Lewayne Bunting, MD;  Location: [facility] ENDOSCOPY;  Service: Cardiovascular;  Laterality: N/A;   WRIST SURGERY     Patient Active Problem List   Diagnosis Date Noted   Thrombosis    Sleep disturbance    Dysphagia, post-stroke    PAF (paroxysmal atrial fibrillation) (HCC)    Acute pulmonary embolism without acute cor pulmonale (HCC)    Malnutrition of moderate degree 11/04/2020   Tracheostomy care (HCC)    Pressure injury of skin 10/23/2020   Intracranial hemorrhage (HCC)    Atrial fibrillation (HCC)    Prediabetes    Leukocytosis    Acute blood loss anemia    Hypernatremia    ICH (intracerebral hemorrhage) (HCC) 09/25/2020   Unilateral primary osteoarthritis, left knee 09/26/2016   Atrial flutter (HCC) 09/25/2013   Near syncope 09/25/2013    REFERRING DIAG: I61.3 (ICD-10-CM) - 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 (HCC) I69.193 (ICD-10-CM) -  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 Decreased, improved ventricle size since 10/06/2020 with stable small volume of IVH. 2. Hematoma and edema along the right superior approach EVD tract is stable, including a small volume of bilateral SAH. 3. Expected evolution of the left midbrain/thalamic hemorrhage and edema. 4. Small additional scattered subacute infarcts remain largely occult by CT. 5. No new intracranial abnormality identified.     IMPRESSION: 1. Similar appearance of intraventricular hemorrhage as well as hemorrhage along prior EVD tract in the right frontal lobe and scattered  cortical subarachnoid hemorrhage. 2. Ventricular size remains stable. 3. No significant change in size of intraparenchymal hemorrhage centered within the left side of the midbrain extending into the thalamic pulvinar and superior cerebellar peduncle. 4. A few small foci of restricted diffusion within the left frontal and parietal lobes, corresponding to small acute/subacute infarcts  PRECAUTIONS: fall  SUBJECTIVE: No new c/o. Pt accompanied by wife   Objective: See treatment notes  Sit<>stand CGA for safety (very fast/impulsive)  Stand pivot t/f min/CGA  Scooting/squat pivot t/f CGA   TODAY'S TREATMENT: 02/02/21: Q-ped with elbow on bench and knees on mat table: alternating hip extensions: 10x R and L Tall kneeling: head/shoulder turns: horizontal: 10x, vertical: 10x Sit to stand no HHA: 0lbs 5x, 20lbs 5x, waiting for 5 sec and balancing before sitting down Standing deadlifts: 20lbs 2 x 5, waiting 5 sec before sitting down, cues for slow lifting of weight to avoid retropulsion at end of deadlift when he stands up Gait training: 1 x 405 feet with RW, cues for standing up tall, keeping in middle of walker to prevent R foot from hitting R back leg, smaller steps with L Leg, pt required min A due to occasional retropulsion, pt also had bil knees buckled and needed A due to fatigue. Standing on Wobble board: AP tilts: 1' with 1 HHA, 1' without HHA (min A, with cues to have kneesl locked and slight anterior lean to prevent retropulusion)  01/30/21: Gait training: 1 x 230 feet with RW, one sitting break, cues for looking down at his feet, smaller steps, keeping wider BOS Min A required due to occaisonal LOB Sit to stand: 15lbs 2 x 5 with 5 sec hold for balance Standing with normal BOS: passing 6lb ball around body: 5x cw, 5x ccw; standing with narrow BOS: 5x cw, 5x ccw with 6lb ball around body Walking in parallel bars with R UE use: 10 x 10 feet fwd and bwd, used center separator board for last  5 reps to prevent legs crossing over. Cues for taking smaller steps, min A required.  01/24/21 Standing normal BOS: EO: 30 sec Standing normal BOS: EC: 1 min Standing normal BOS: EO, horizontal head turns: 20x Standing deadlifts with 15lb KB from floor: 10x, pt demo decreased WB to L LE Standing weight shifts: lateral and anterior/posterior: 20x Sit to stand: 15lb KB in hands: from standard chair, tactile and verbal cues for "nose over toes" to prevent retropulsion. Standing DOT taps: 10x L only with 2lbs at ankle to improve coordination, 10x alternating with R and L, required min to mod A for balance without HHA Standing normal BOS: bil shoulder flexion holding ball in hand: 10x; big circles: 10x cw, 10x ccw, cues to improve lateral reaching to improve trunk rotation when performing circles. Fwd step ups 6" box 10x R and L with bil  UE support    01/18/21 Sit<>stand x10  Seated heel slide with washrag x10  Standing heel slide forward with washrag 3x10 with targets  Standing heel slide to the side with washrag 3x10 with targets  Standing step forward and back on to targets 3x10  Standing balance with alternating UE raises 2x10  In // bars: forward stepping, pt cued to step on targets and "slide" foot to keep from L LE marching x 6 trips  Amb around gym 2x115' with tactile cues to keep from excessive L hip flexion, v/cs to "slide" L foot forward and slow down steps    01/16/21  Latissimus press 15x  Seated FAQs 2# alt 15x w/lat press  Seated marching 2# alt 15x w/lat press  Seated heel slides using towel 15x per side  Seated core exercises of hip and shoulder tosses, chops and Vs with 2.2# ball, 10x ea.   Seated OH lift with inspiration using 2.2# ball, 10x   Seated forward flexion with HHA from PT 10x  5xSTS with CGA diminishing to SBA  Lateral reaches over red physioball 10x ea.   01/08/21  Supine bridge, alternating marching, hip fallouts, alternating UE F/E all 2x15  Seated LAQs,  marching, 2x15 performed with lat press    01/03/21 BERG score 11/56, 5x STS 35.9s with CGA Balance training in //bars, fwd and sidestep 10x per LE, marching alt pattern and heel/toe raises Stepping tasks fwd using 4" block, 10x per LE with fwd WS in // bars Latissimus press with deep inspiration 10 reps  Tapping floor targets, single target in center 10x alt in // bars 2 targets lateral of center, 10x alt in //bars    PATIENT EDUCATION: Education details: sit to stand instructions for safety Person educated: Patient and Spouse Education method: Explanation, Demonstration, and Verbal cues Education comprehension: verbalized understanding     HOME EXERCISE PROGRAM: Step/tapping on targets at home  ASSESSMENT:   CLINICAL IMPRESSION: Pt needs cueing for safe transfers and ambulation. Pt will continue to benefit from skilled PT to improve gait, balance and safety awareness.  REHAB POTENTIAL: Good   CLINICAL DECISION MAKING: Stable/uncomplicated GOALS: Goals reviewed with patient? Yes   SHORT TERM GOALS:   STG Name Target Date Goal status  1 Patient will be able to perform chair to chair transfer with CGA/SBA with use of RW for safety to improve independence. Baseline: min to mod A (12/26/20) 01/23/2021 01/03/21 MinA/CGA to transfer in/out of WC  2 Pt will be able to ambulate 115' with RW with CGA to min A with RW for safety to improve independence Baseline: min to mod A with RW 145' 01/23/2021 INITIAL  3 Pt will be able to maintain standing balance for 30 sec to improve static balance Baseline: 10 sec before needing help due to LOB (12/26/20) 01/23/2021 01/03/21 Able to stand 2 min with CGA    LONG TERM GOALS:   LTG Name Target Date Goal status  1 Pt will demo >5 points improvement on Berg Balance Scale to improve static balance Baseline: 11/56 01/03/21 02/20/2021 INITIAL  2 Pt will be able to ambulate >500 feet with RW and SBA to improve walking endurance and safety. Baseline: 145'  with RW min to mod A 02/20/2021 INITIAL  3 Pt will be able to perform gait speed improvement by 0.13m/s to improve community ambulation. Baseline:TBD 02/20/2021 INITIAL  4 Pt will demo 5 sec improvement in 5x sit to stand (with or without UE support) to improve functional strength  Baseline: TBD 02/20/2021 INITIAL    PLAN: PT FREQUENCY: 2x/week   PT DURATION: 8 weeks   PLANNED INTERVENTIONS: Therapeutic exercises, Therapeutic activity, Neuro Muscular re-education, Balance training, Gait training, Patient/Family education, Joint mobilization, Stair training, Visual/preceptual remediation/compensation, Wheelchair mobility training, Cryotherapy, Moist heat, and Manual therapy   PLAN FOR NEXT SESSION: 10 visit Progress note Standing tasks in // bars, floor targets and stepping tasks, Scifit?, STS and pivots     Patient will benefit from skilled therapeutic intervention in order to improve the following deficits and impairments:     Visit Diagnosis: Muscle weakness (generalized)  Ataxia  Unsteadiness on feet     Problem List Patient Active Problem List   Diagnosis Date Noted   Thrombosis    Sleep disturbance    Dysphagia, post-stroke    PAF (paroxysmal atrial fibrillation) (HCC)    Acute pulmonary embolism without acute cor pulmonale (HCC)    Malnutrition of moderate degree 11/04/2020   Tracheostomy care (HCC)    Pressure injury of skin 10/23/2020   Intracranial hemorrhage (HCC)    Atrial fibrillation (HCC)    Prediabetes    Leukocytosis    Acute blood loss anemia    Hypernatremia    ICH (intracerebral hemorrhage) (HCC) 09/25/2020   Unilateral primary osteoarthritis, left knee 09/26/2016   Atrial flutter (HCC) 09/25/2013   Near syncope 09/25/2013    [name] PT, DPT 02/02/2021, 11:20 AM  [facility] [address] [place], [place], [address] Phone: [phone]   Fax:  [phone]  Name: Walter [name] MRN: [id number] Date of Birth: 1956/06/16

## 2021-02-02 NOTE — Therapy (Signed)
[facility] [address] [place], [place], [address] Phone: [phone]   Fax:  [phone]  Occupational Therapy Treatment  Patient Details  Name: Walter [name] MRN: [id number] Date of Birth: 29-Oct-1955 Referring Provider (OT): Dr. Alysia Penna   Encounter Date: 02/02/2021   OT End of Session - 02/02/21 1427     Visit Number 7    Number of Visits 40    Date for OT Re-Evaluation 03/17/21    Authorization Type Medicare Part A & B    Authorization Time Period 02/02/21-updated plan for 3 x week x 12 weeks, anticipate d/c after 8 weeks    Authorization - Visit Number 7    Authorization - Number of Visits 10    Progress Note Due on Visit 10    OT Start Time 1019    OT Stop Time 1100    OT Time Calculation (min) 41 min    Activity Tolerance Patient tolerated treatment well    Behavior During Therapy Impulsive             Past Medical History:  Diagnosis Date   Atrial flutter ([place])    s/p ablation   DVT (deep venous thrombosis) ([place])    Near syncope 09/25/2013   Paroxysmal atrial fibrillation ([place])    Stroke ([place])    initially hemorragic (not on [place]) followed by subsequent embolic stroke    Past Surgical History:  Procedure Laterality Date   A-FLUTTER ABLATION N/A 03/09/2020   Procedure: A-FLUTTER ABLATION;  Surgeon: Thompson Grayer, MD;  Location: [facility];  Service: Cardiovascular;  Laterality: N/A;   CARDIOVERSION N/A 09/27/2013   Procedure: CARDIOVERSION;  Surgeon: Lelon Perla, MD;  Location: [facility] ENDOSCOPY;  Service: Cardiovascular;  Laterality: N/A;   IR ANGIO EXTERNAL CAROTID SEL EXT CAROTID UNI R MOD SED  09/26/2020   IR ANGIO INTRA EXTRACRAN SEL COM CAROTID INNOMINATE BILAT MOD SED  09/26/2020   IR ANGIO VERTEBRAL SEL VERTEBRAL UNI R MOD SED  09/26/2020   IR IVC FILTER PLMT / S&I /IMG GUID/MOD SED  10/08/2020   IR IVUS EACH ADDITIONAL NON CORONARY VESSEL  11/02/2020   IR PTA VENOUS EXCEPT  DIALYSIS CIRCUIT  11/02/2020   IR RADIOLOGIST EVAL & MGMT  01/17/2021   IR THROMBECT VENO MECH MOD SED  10/27/2020   IR THROMBECT VENO MECH MOD SED  11/02/2020   IR US GUIDE VASC ACCESS LEFT  10/27/2020   IR US GUIDE VASC ACCESS RIGHT  09/26/2020   IR US GUIDE VASC ACCESS RIGHT  11/02/2020   IR VENO/EXT/UNI RIGHT  11/02/2020   IR VENOCAVAGRAM IVC  10/27/2020   RETINAL DETACHMENT SURGERY     right knee arthroscopy     TEE WITHOUT CARDIOVERSION N/A 09/27/2013   Procedure: TRANSESOPHAGEAL ECHOCARDIOGRAM (TEE);  Surgeon: Lelon Perla, MD;  Location: [facility] ENDOSCOPY;  Service: Cardiovascular;  Laterality: N/A;   WRIST SURGERY      There were no vitals filed for this visit.   Subjective Assessment - 02/02/21 1024     Pertinent History ICH of brainstem (complicated by hydrocephalus, DVTs, and tracheostomy).  PMH:  a-fib    Limitations fall risk, ataxia, impulsivity    Patient Stated Goals use L side of body better (decrease ataxia), stay active and heal brain    Currently in Pain? No/denies                 Treatment: discussed sensory precautions for RUE with pt./ wife.  Standing at sink weightbearing through bilateral UE's rocking forwards and backwards, min A. Supine closed chain shoulder flexion, chest press, min v.c for control. Prone on elbows lifting chest, then reaching with alternate UE's min v.c Tall kneeling rolling ball forwards and backwards for trunk control, and weightbearing mod facilitation for safety, second person present due to ataxia.                  OT Short Term Goals - 02/02/21 1412       OT SHORT TERM GOAL #1   Title Pt/caregiver will be independent with initial HEP for LUE coordination and R hand strength.--    Time 4    Period Weeks    Status On-going    Target Date 03/02/21      OT SHORT TERM GOAL #2   Title Pt/caregiver will be independent with vision HEP and visual compensation strategies.    Time 4    Period Weeks    Status On-going    Pt/ family verbalize understanding of vision HEP     OT SHORT TERM GOAL #3   Title Pt will improve LUE coordination for ADLs as shown by improving score on box and blocks test by at least 6.    Baseline L-21 blocks    Time 4    Period Weeks    Status On-going      OT SHORT TERM GOAL #4   Title Pt will perform simple snack prep/home maintenance task from w/c level mod I.    Time 4    Period Weeks    Status Achieved   met per pt's wife     OT SHORT TERM GOAL #5   Title Pt will verbalize understanding of compensation strategies for sensory deficts for incr safety.    Time 4    Period Weeks    Status On-going   needs reinforcement     OT SHORT TERM GOAL #6   Title Pt will improve R hand grip stength to at least 32lbs to assist with ADLs/opening containers.    Time 4    Period Weeks    Status On-going      OT SHORT TERM GOAL #7   Title Pt will demonstrate improved LUE control for ADLs as evidenced by perfroming at least 26 blocks on box/ blocks test.    Time 4    Period Weeks    Status New               OT Long Term Goals - 02/02/21 1416       OT LONG TERM GOAL #1   Title Pt will be independent with updated HEP--    Time 12    Period Weeks    Status On-going      OT LONG TERM GOAL #2   Title Pt will improve L hand coordination for ADLs as shown by completing 9-hole peg test in less than 70sec.    Baseline 107sec    Time 8    Period Weeks    Status On-going      OT LONG TERM GOAL #3   Title Pt will be mod I with toileting.    Time 8    Period Weeks    Status On-going      OT LONG TERM GOAL #4   Title Pt will perform simple snack prep/home maintenance tasks in standing with supervision.    Time 8    Period Weeks    Status On-going  OT LONG TERM GOAL #5   Title Pt will demo good safety awareness/compensation for sensory deficits and attention to LUE during functional tasks and transfers without cueing.    Time 8    Period Weeks    Status On-going       OT LONG TERM GOAL #6   Title Pt will perform simple environmental scanning/navigation with at least 90% accuracy for incr safety.    Time 8    Period Weeks    Status On-going      OT LONG TERM GOAL #7   Title Pt will perform all basic ADLS with no more than supervision.    Time 12    Period Weeks    Status New                   Plan - 02/02/21 1418     Clinical Impression Statement [name] was present for therapy today. Therapist  discussed the possibility of increasing frequency to 3x week for the next month. Plan of care is being sent to MD to update frequency. Pt is continuing to progress toward goals. PT will benefit from continued skilled occupational therapy to address: viisual deficits, cogntive deficits, ataxia, decreased strength,decreased coordination, decreased balance which impedes perfromance of ADLS/IADLS.    OT Occupational Profile and History Detailed Assessment- Review of Records and additional review of physical, cognitive, psychosocial history related to current functional performance    Occupational performance deficits (Please refer to evaluation for details): ADL's;IADL's;Work;Leisure;Social Participation    Body Structure / Function / Physical Skills ADL;Strength;Balance;Proprioception;UE functional use;IADL;Endurance;Vision;Mobility;Coordination;Decreased knowledge of precautions;FMC;GMC;Sensation    Cognitive Skills Attention;Safety Awareness;Memory;Perception;Problem Solve    Rehab Potential Good    Clinical Decision Making Several treatment options, min-mod task modification necessary    Comorbidities Affecting Occupational Performance: May have comorbidities impacting occupational performance    Modification or Assistance to Complete Evaluation  Min-Moderate modification of tasks or assist with assess necessary to complete eval    OT Frequency 3x / week   updated frequency may decrease after 4 weeks   OT Duration 12 weeks   written for 12 weeks  however may d/c after 8 depenent upon progress.   OT Treatment/Interventions Self-care/ADL training;Moist Heat;Fluidtherapy;DME and/or AE instruction;Balance training;Therapeutic activities;Aquatic Therapy;Ultrasound;Therapeutic exercise;Cognitive remediation/compensation;Visual/perceptual remediation/compensation;Functional Mobility Training;Neuromuscular education;Cryotherapy;Energy conservation;Manual Therapy;Patient/family education    Plan compensatory strategies for visual deficits, strategies for ataxia, functional reaching, standing balance    Consulted and Agree with Plan of Care Family member/caregiver;Patient    Family Member Consulted wife             Patient will benefit from skilled therapeutic intervention in order to improve the following deficits and impairments:   Body Structure / Function / Physical Skills: ADL, Strength, Balance, Proprioception, UE functional use, IADL, Endurance, Vision, Mobility, Coordination, Decreased knowledge of precautions, FMC, GMC, Sensation Cognitive Skills: Attention, Safety Awareness, Memory, Perception, Problem Solve     Visit Diagnosis: Muscle weakness (generalized)  Ataxia  Unsteadiness on feet  Other lack of coordination  Other disturbances of skin sensation  Visuospatial deficit  Frontal lobe and executive function deficit  Other symptoms and signs involving cognitive functions following other nontraumatic intracranial hemorrhage    Problem List Patient Active Problem List   Diagnosis Date Noted   Thrombosis    Sleep disturbance    Dysphagia, post-stroke    PAF (paroxysmal atrial fibrillation) (H[facility])    Acute pulmonary embolism without acute cor pulmonale (HCC)    Malnutrition of moderate degree 11/04/2020  Tracheostomy care ([place])    Pressure injury of skin 10/23/2020   Intracranial hemorrhage ([facility])    Atrial fibrillation ([facility])    Prediabetes    Leukocytosis    Acute blood loss anemia    Hypernatremia    ICH  (intracerebral hemorrhage) ([place]) 09/25/2020   Unilateral primary osteoarthritis, left knee 09/26/2016   Atrial flutter ([place]) 09/25/2013   Near syncope 09/25/2013    Letti Towell 02/02/2021, 2:34 PM  [facility] [address] [place], [place], [address] Phone: [phone]   Fax:  [phone]  Name: Walter [name] MRN: [id number] Date of Birth: [birth year]

## 2021-02-05 ENCOUNTER — Ambulatory Visit: Payer: Medicare Other

## 2021-02-05 ENCOUNTER — Other Ambulatory Visit: Payer: Self-pay

## 2021-02-05 ENCOUNTER — Ambulatory Visit: Payer: Medicare Other | Admitting: Occupational Therapy

## 2021-02-05 DIAGNOSIS — R270 Ataxia, unspecified: Secondary | ICD-10-CM

## 2021-02-05 DIAGNOSIS — R41844 Frontal lobe and executive function deficit: Secondary | ICD-10-CM

## 2021-02-05 DIAGNOSIS — R278 Other lack of coordination: Secondary | ICD-10-CM

## 2021-02-05 DIAGNOSIS — M6281 Muscle weakness (generalized): Secondary | ICD-10-CM

## 2021-02-05 DIAGNOSIS — R208 Other disturbances of skin sensation: Secondary | ICD-10-CM

## 2021-02-05 DIAGNOSIS — R2681 Unsteadiness on feet: Secondary | ICD-10-CM

## 2021-02-05 DIAGNOSIS — R471 Dysarthria and anarthria: Secondary | ICD-10-CM

## 2021-02-05 DIAGNOSIS — R41842 Visuospatial deficit: Secondary | ICD-10-CM

## 2021-02-05 DIAGNOSIS — R41841 Cognitive communication deficit: Secondary | ICD-10-CM

## 2021-02-05 NOTE — Therapy (Signed)
[facility] [address] [place], [place], [address] Phone: [phone]   Fax:  [phone]  Occupational Therapy Treatment  Patient Details  Name: Walter [name] MRN: [id number] Date of Birth: 28-Jan-1956 Referring Provider (OT): Dr. Alysia Penna   Encounter Date: 02/05/2021   OT End of Session - 02/05/21 1438     Visit Number 8    Number of Visits 70    Date for OT Re-Evaluation 04/27/21    Authorization Type Medicare Part A & B    Authorization Time Period 02/02/21-updated plan for 3 x week x 12 weeks, anticipate d/c after 8 weeks    Authorization - Visit Number 8    Authorization - Number of Visits 10    Progress Note Due on Visit 10    OT Start Time 1218    OT Stop Time 1400    OT Time Calculation (min) 102 min             Past Medical History:  Diagnosis Date   Atrial flutter ([place])    s/p ablation   DVT (deep venous thrombosis) ([place])    Near syncope 09/25/2013   Paroxysmal atrial fibrillation ([place])    Stroke [facility])    initially hemorragic (not on [place]) followed by subsequent embolic stroke    Past Surgical History:  Procedure Laterality Date   A-FLUTTER ABLATION N/A 03/09/2020   Procedure: A-FLUTTER ABLATION;  Surgeon: Thompson Grayer, MD;  Location: [facility];  Service: Cardiovascular;  Laterality: N/A;   CARDIOVERSION N/A 09/27/2013   Procedure: CARDIOVERSION;  Surgeon: Lelon Perla, MD;  [facility] ENDOSCOPY;  Service: Cardiovascular;  Laterality: N/A;   IR ANGIO EXTERNAL CAROTID SEL EXT CAROTID UNI R MOD SED  09/26/2020   IR ANGIO INTRA EXTRACRAN SEL COM CAROTID INNOMINATE BILAT MOD SED  09/26/2020   IR ANGIO VERTEBRAL SEL VERTEBRAL UNI R MOD SED  09/26/2020   IR IVC FILTER PLMT / S&I /IMG GUID/MOD SED  10/08/2020   IR IVUS EACH ADDITIONAL NON CORONARY VESSEL  11/02/2020   IR PTA VENOUS EXCEPT DIALYSIS CIRCUIT  11/02/2020   IR RADIOLOGIST EVAL & MGMT  01/17/2021   IR THROMBECT VENO MECH  MOD SED  10/27/2020   IR THROMBECT VENO MECH MOD SED  11/02/2020   IR US GUIDE VASC ACCESS LEFT  10/27/2020   IR US GUIDE VASC ACCESS RIGHT  09/26/2020   IR US GUIDE VASC ACCESS RIGHT  11/02/2020   IR VENO/EXT/UNI RIGHT  11/02/2020   IR VENOCAVAGRAM IVC  10/27/2020   RETINAL DETACHMENT SURGERY     right knee arthroscopy     TEE WITHOUT CARDIOVERSION N/A 09/27/2013   Procedure: TRANSESOPHAGEAL ECHOCARDIOGRAM (TEE);  Surgeon: Lelon Perla, MD;  Location: [facility] ENDOSCOPY;  Service: Cardiovascular;  Laterality: N/A;   WRIST SURGERY      There were no vitals filed for this visit.    Treatment: Pt denies pain today. Supine closed chain shoulder flexion and chest press, min facilitation/ v.c Prone on elbows for modified plank, then lifting alternate UE's for core stability, tall kneeling and quadraped over stool, min-mod facilitation, v.c to move slowly due to impulsivity. Stand pivot transfer to chair, min A, however pt sat down quickly leaving walker behind. Therapist discussed fall risk with pt. Activities for fine motor coordination, flipping and dealing cards, placing checker in connect 4 frame, min difficulty/ v.c Therapist checked progress towards short term goals.  OT Education - 02/05/21 1441     Education Details Reviewed sensory precautions with pt/ cargeiver, and discussed compensatory strategies for visual deficits    Person(s) Educated Patient;Caregiver(s)    Methods Explanation;Verbal cues    Comprehension Verbalized understanding;Verbal cues required;Need further instruction              OT Short Term Goals - 02/05/21 1336       OT SHORT TERM GOAL #1   Title Pt/caregiver will be independent with initial HEP for LUE coordination and R hand strength.--    Time 4    Period Weeks    Status On-going   needs continued reinforcement, pt is not using consistently   Target Date 03/02/21      OT SHORT TERM GOAL #2   Title Pt/caregiver will  be independent with vision HEP and visual compensation strategies.    Time 4    Period Weeks    Status On-going   Pt/ family verbalize understanding of vision HEP, needs continued reinforcement of compensatory strategeis- pt is not performing head turns     OT SHORT TERM GOAL #3   Title Pt will improve LUE coordination for ADLs as shown by improving score on box and blocks test by at least 6.    Baseline L-21 blocks    Time 4    Period Weeks    Status On-going   25 blocks     OT SHORT TERM GOAL #4   Title Pt will perform simple snack prep/home maintenance task from w/c level mod I.    Time 4    Period Weeks    Status Achieved   met per pt's wife     OT SHORT TERM GOAL #5   Title Pt will verbalize understanding of compensation strategies for sensory deficts for incr safety.    Time 4    Period Weeks    Status On-going   Pt verbalized that he has to be careful about temperature, needs additional reinforcement     OT SHORT TERM GOAL #6   Title Pt will improve R hand grip stength to at least 32lbs to assist with ADLs/opening containers.    Time 4    Period Weeks    Status Achieved   33.5     OT SHORT TERM GOAL #7   Title Pt will demonstrate improved LUE control for ADLs as evidenced by perfroming at least 26 blocks on box/ blocks test.    Time 4    Period Weeks    Status Deferred   Pt already has box/ blocks goal              OT Long Term Goals - 02/02/21 1416       OT LONG TERM GOAL #1   Title Pt will be independent with updated HEP--    Time 12    Period Weeks    Status On-going    Target Date 04/27/21      OT LONG TERM GOAL #2   Title Pt will improve L hand coordination for ADLs as shown by completing 9-hole peg test in less than 70sec.    Baseline 107sec    Time 12    Period Weeks    Status On-going      OT LONG TERM GOAL #3   Title Pt will be mod I with toileting.    Time 12    Period Weeks    Status On-going      OT  LONG TERM GOAL #4   Title Pt  will perform simple snack prep/home maintenance tasks in standing with supervision.    Time 12    Period Weeks    Status On-going      OT LONG TERM GOAL #5   Title Pt will demo good safety awareness/compensation for sensory deficits and attention to LUE during functional tasks and transfers without cueing.    Time 12    Period Weeks    Status On-going      OT LONG TERM GOAL #6   Title Pt will perform simple environmental scanning/navigation with at least 90% accuracy for incr safety.    Time 12    Period Weeks    Status On-going      OT LONG TERM GOAL #7   Title Pt will perform all basic ADLS with no more than supervision.    Time 12    Period Weeks    Status New                   Plan - 02/05/21 1440     Clinical Impression Statement Pt is progressing towards goals. He achieved several short term goals. See goals for updates. Hired caregiver Claiborne Billings was here today.    OT Occupational Profile and History Detailed Assessment- Review of Records and additional review of physical, cognitive, psychosocial history related to current functional performance    Occupational performance deficits (Please refer to evaluation for details): ADL's;IADL's;Work;Leisure;Social Participation    Body Structure / Function / Physical Skills ADL;Strength;Balance;Proprioception;UE functional use;IADL;Endurance;Vision;Mobility;Coordination;Decreased knowledge of precautions;FMC;GMC;Sensation    Cognitive Skills Attention;Safety Awareness;Memory;Perception;Problem Solve    Rehab Potential Good    Clinical Decision Making Several treatment options, min-mod task modification necessary    Comorbidities Affecting Occupational Performance: May have comorbidities impacting occupational performance    Modification or Assistance to Complete Evaluation  Min-Moderate modification of tasks or assist with assess necessary to complete eval    OT Frequency 3x / week   updated frequency may decrease after 4 weeks    OT Duration 12 weeks   written for 12 weeks however may d/c after 8 depenent upon progress.   OT Treatment/Interventions Self-care/ADL training;Moist Heat;Fluidtherapy;DME and/or AE instruction;Balance training;Therapeutic activities;Aquatic Therapy;Ultrasound;Therapeutic exercise;Cognitive remediation/compensation;Visual/perceptual remediation/compensation;Functional Mobility Training;Neuromuscular education;Cryotherapy;Energy conservation;Manual Therapy;Patient/family education    Plan reinforce compensatory strategies for visual deficits, strategies for ataxia, functional reaching, standing balance    Consulted and Agree with Plan of Care Family member/caregiver;Patient    Family Member Consulted wife             Patient will benefit from skilled therapeutic intervention in order to improve the following deficits and impairments:   Body Structure / Function / Physical Skills: ADL, Strength, Balance, Proprioception, UE functional use, IADL, Endurance, Vision, Mobility, Coordination, Decreased knowledge of precautions, FMC, GMC, Sensation Cognitive Skills: Attention, Safety Awareness, Memory, Perception, Problem Solve     Visit Diagnosis: Muscle weakness (generalized)  Ataxia  Unsteadiness on feet  Other lack of coordination  Other disturbances of skin sensation  Visuospatial deficit  Frontal lobe and executive function deficit    Problem List Patient Active Problem List   Diagnosis Date Noted   Thrombosis    Sleep disturbance    Dysphagia, post-stroke    PAF (paroxysmal atrial fibrillation) (HCC)    Acute pulmonary embolism without acute cor pulmonale (HCC)    Malnutrition of moderate degree 11/04/2020   Tracheostomy care ([place])    Pressure injury of skin 10/23/2020   Intracranial hemorrhage (  [place])    Atrial fibrillation ([place])    Prediabetes    Leukocytosis    Acute blood loss anemia    Hypernatremia    ICH (intracerebral hemorrhage) ([place]) 09/25/2020    Unilateral primary osteoarthritis, left knee 09/26/2016   Atrial flutter ([place]) 09/25/2013   Near syncope 09/25/2013    Larose Batres 02/05/2021, 2:42 PM  [facility] [address] [place], [place], [address] Phone: [phone]   Fax:  [phone]  Name: Walter [name] MRN: [id number] Date of Birth: 11-14-1955

## 2021-02-05 NOTE — Therapy (Signed)
[facility] [address] [place], [place], [address] Phone: [phone]   Fax:  [phone]  Speech Language Pathology Treatment  Patient Details  Name: Walter [name] MRN: [id number] Date of Birth: 1956-03-26 Referring Provider (SLP): Charlett Blake, MD   Encounter Date: 02/05/2021   End of Session - 02/05/21 1357     Visit Number 11    Number of Visits 17    Date for SLP Re-Evaluation 02/21/21    Authorization Type Medicare    SLP Start Time 1150   pt arrived 5 mins late   SLP Stop Time  1230    SLP Time Calculation (min) 40 min    Activity Tolerance Patient tolerated treatment well             Past Medical History:  Diagnosis Date   Atrial flutter ([place])    s/p ablation   DVT (deep venous thrombosis) ([place])    Near syncope 09/25/2013   Paroxysmal atrial fibrillation ([place])    Stroke [facility])    initially hemorragic (not on [facility]) followed by subsequent embolic stroke    Past Surgical History:  Procedure Laterality Date   A-FLUTTER ABLATION N/A 03/09/2020   Procedure: A-FLUTTER ABLATION;  Surgeon: Thompson Grayer, MD;  Location: [facility] CV LAB;  Service: Cardiovascular;  Laterality: N/A;   CARDIOVERSION N/A 09/27/2013   Procedure: CARDIOVERSION;  Surgeon: Lelon Perla, MD;  Location: [facility] ENDOSCOPY;  Service: Cardiovascular;  Laterality: N/A;   IR ANGIO EXTERNAL CAROTID SEL EXT CAROTID UNI R MOD SED  09/26/2020   IR ANGIO INTRA EXTRACRAN SEL COM CAROTID INNOMINATE BILAT MOD SED  09/26/2020   IR ANGIO VERTEBRAL SEL VERTEBRAL UNI R MOD SED  09/26/2020   IR IVC FILTER PLMT / S&I /IMG GUID/MOD SED  10/08/2020   IR IVUS EACH ADDITIONAL NON CORONARY VESSEL  11/02/2020   IR PTA VENOUS EXCEPT DIALYSIS CIRCUIT  11/02/2020   IR RADIOLOGIST EVAL & MGMT  01/17/2021   IR THROMBECT VENO MECH MOD SED  10/27/2020   IR THROMBECT VENO MECH MOD SED  11/02/2020   IR US GUIDE VASC ACCESS LEFT  10/27/2020   IR US GUIDE VASC ACCESS  RIGHT  09/26/2020   IR US GUIDE VASC ACCESS RIGHT  11/02/2020   IR VENO/EXT/UNI RIGHT  11/02/2020   IR VENOCAVAGRAM IVC  10/27/2020   RETINAL DETACHMENT SURGERY     right knee arthroscopy     TEE WITHOUT CARDIOVERSION N/A 09/27/2013   Procedure: TRANSESOPHAGEAL ECHOCARDIOGRAM (TEE);  Surgeon: Lelon Perla, MD;  Location: [facility] ENDOSCOPY;  Service: Cardiovascular;  Laterality: N/A;   WRIST SURGERY      There were no vitals filed for this visit.   Subjective Assessment - 02/05/21 1348     Subjective "I'm breathing a lot at home"    Patient is accompained by: --   caregiver, Claiborne Billings   Currently in Pain? No/denies                   ADULT SLP TREATMENT - 02/05/21 1349       General Information   Behavior/Cognition Alert;Cooperative;Pleasant mood;Requires cueing      Treatment Provided   Treatment provided Cognitive-Linquistic      Cognitive-Linquistic Treatment   Treatment focused on Cognition;Dysarthria;Patient/family/caregiver education    Skilled Treatment Abdominal breathing (AB) targeted with intermittent min-mod cues for pt to look at hand as pt demonstrated reduced sustained/selective attention and easily distracted. Pt noted with usual inhalation via  mouth, which fostered clavicular breathing. SLP introduced sniff-sniff-blow to aid inhalation via nose. Occasional cues required to take 2-3 sniffs as pt would take 5+ sniffs intermittently. SLP targeted reading sentences and generating repsonse given sentence starter. Pt produced WNL volume with 90% accuracy. Pt benefited from visual aid to limit view to 1 sentence at time to reduce rate of speed between sentences impacting breath support. SLP reviewed recommendation for AB at rest at home for 10-15 minutes BID and total work on speech tasks for loudness 45-60 mintues/day with pt working for 15-20 minutes (longer if he can tolerate).      Assessment / Recommendations / Plan   Plan Continue with current plan of care       Progression Toward Goals   Progression toward goals Progressing toward goals              SLP Education - 02/05/21 1357     Education Details sniff-sniff blow for AB, non-verbal cues at home    Person(s) Educated Patient;Caregiver(s)    Methods Explanation;Demonstration    Comprehension Verbalized understanding;Returned demonstration;Need further instruction              SLP Short Term Goals - 01/30/21 1725       SLP SHORT TERM GOAL #1   Title Pt will complete dysarthria HEP with occasional min A over 2 sessions    Time 1    Period Weeks    Status Not Met      SLP SHORT TERM GOAL #2   Title Pt will produce loud /a/ or "hey!" with at least high 80s dB average over 2 sessions    Time 1    Period Weeks    Status Not Met      SLP SHORT TERM GOAL #3   Title Pt will generate abdominal breathing in 16/20 answers to SLP questions over 2 sessions    Baseline 01-18-21    Time 1    Period Weeks    Status Partially Met      SLP SHORT TERM GOAL #4   Title Pt will demonstrate dysarthria compensations in 10 minute simple to mod complex conversation to be 75% intelligibile with occasional min A over 2 sessions    Time 1    Period Weeks    Status Not Met      SLP SHORT TERM GOAL #5   Title Pt will complete formal cognitive linguistic assessment in first 1-2 ST sessions    Status Achieved      SLP SHORT TERM GOAL #6   Title Pt will use memory and attention compensations for appointments, medicine management, and other daily activities with occasional min A over 2 sessions    Time 1    Period Weeks    Status Not Met              SLP Long Term Goals - 02/05/21 1358       SLP LONG TERM GOAL #1   Title Pt will complete dysarthria HEP with rare min A over 2 sessions    Time 3    Period Weeks    Status On-going      SLP LONG TERM GOAL #2   Title Pt will demonstrate dysarthria compensations in 15+ minute simple to mod complex conversation to be 90% intelligibile with  rare min A over 2 sessions    Time 3    Period Weeks    Status On-going      SLP LONG  TERM GOAL #3   Title Pt will demo improved sustained and selective attention during therapeutic tasks with less than 3 cues for active listening during 2 sessions    Time 3    Period Weeks    Status On-going      SLP LONG TERM GOAL #4   Title Pt will demonstrate emergent and anticipatory awareness of impulsivity with less than 5 cues to modify behaviors during 2 sessions    Time 3    Period Weeks    Status On-going      SLP LONG TERM GOAL #5   Title Pt will report reduced frustration and improved communication effectiveness via PROM by 2 points at last ST sessions    Baseline CES: 15.5 & V-RQOL:35    Time 3    Period Weeks    Status On-going              Plan - 02/05/21 1358     Clinical Impression Statement "[name]" presents for OPST intervention secondary to stroke in March 2022. SLP continued education and training pt and caregiver in breath support, and audible, intelligible speech. Intermittent cues required to sustain hand placement on abdomen for abdominal breathing. SLP introduced sniff-sniff-blow to facilitate abdominal breathing. WNL volume exhibited on ~90% of structured tasks. Skilled ST is cont'd warranted to address dysarthria and cognition to maximize communication effectiveness and optimize functional independence and safety at home.    Speech Therapy Frequency 3x / week    Duration 8 weeks    Treatment/Interventions Cueing hierarchy;Functional tasks;Patient/family education;Cognitive reorganization;Multimodal communcation approach;Language facilitation;Compensatory techniques;Internal/external aids;SLP instruction and feedback    Potential to Achieve Goals Fair    Potential Considerations Ability to learn/carryover information;Cooperation/participation level    SLP Home Exercise Plan provided    Consulted and Agree with Plan of Care Patient;Family member/caregiver              Patient will benefit from skilled therapeutic intervention in order to improve the following deficits and impairments:   Dysarthria and anarthria  Cognitive communication deficit    Problem List Patient Active Problem List   Diagnosis Date Noted   Thrombosis    Sleep disturbance    Dysphagia, post-stroke    PAF (paroxysmal atrial fibrillation) ([place])    Acute pulmonary embolism without acute cor pulmonale (HCC)    Malnutrition of moderate degree 11/04/2020   Tracheostomy care ([place])    Pressure injury of skin 10/23/2020   Intracranial hemorrhage (HCC)    Atrial fibrillation ([place])    Prediabetes    Leukocytosis    Acute blood loss anemia    Hypernatremia    ICH (intracerebral hemorrhage) ([place]) 09/25/2020   Unilateral primary osteoarthritis, left knee 09/26/2016   Atrial flutter ([place]) 09/25/2013   Near syncope 09/25/2013    Alinda Deem, [name] 02/05/2021, 2:47 PM  [facility] [address] [place], [place], [address] Phone: [phone]   Fax:  [phone]   Name: EWART CARRERA MRN: [id number] Date of Birth: [birth year]

## 2021-02-05 NOTE — Therapy (Addendum)
Date of Birth: [birth year] OUTPATIENT PHYSICAL THERAPY TREATMENT NOTE/10th Visit Progress Note   Patient Name: Walter [name] MRN: [id number] DOB:[birth year], 65 y.o., male Today's Date: 02/05/2021  PCP: Alroy Dust, L.Marlou Sa, MD REFERRING PROVIDER: Charlett Blake, MD     PT End of Session - 02/05/21 1235     Visit Number 10    Number of Visits 17    Date for PT Re-Evaluation 02/20/21    Authorization Type Medicare 12/26/20 to 02/20/21    Progress Note Due on Visit 10    PT Start Time 1230    PT Stop Time 1315    PT Time Calculation (min) 45 min    Equipment Utilized During Treatment Gait belt    Activity Tolerance Patient tolerated treatment well              Past Medical History:  Diagnosis Date   Atrial flutter ([place])    s/p ablation   DVT (deep venous thrombosis) ([place])    Near syncope 09/25/2013   Paroxysmal atrial fibrillation ([place])    Stroke [facility])    initially hemorragic (not on [place]) followed by subsequent embolic stroke   Past Surgical History:  Procedure Laterality Date   A-FLUTTER ABLATION N/A 03/09/2020   Procedure: A-FLUTTER ABLATION;  Surgeon: Thompson Grayer, MD;  Location: [place] CV LAB;  Service: Cardiovascular;  Laterality: N/A;   CARDIOVERSION N/A 09/27/2013   Procedure: CARDIOVERSION;  Surgeon: Lelon Perla, MD;  Location: [facility] ENDOSCOPY;  Service: Cardiovascular;  Laterality: N/A;   IR ANGIO EXTERNAL CAROTID SEL EXT CAROTID UNI R MOD SED  09/26/2020   IR ANGIO INTRA EXTRACRAN SEL COM CAROTID INNOMINATE BILAT MOD SED  09/26/2020   IR ANGIO VERTEBRAL SEL VERTEBRAL UNI R MOD SED  09/26/2020   IR IVC FILTER PLMT / S&I /IMG GUID/MOD SED  10/08/2020   IR IVUS EACH ADDITIONAL NON CORONARY VESSEL  11/02/2020   IR PTA VENOUS EXCEPT DIALYSIS CIRCUIT  11/02/2020   IR RADIOLOGIST EVAL & MGMT  01/17/2021   IR THROMBECT VENO MECH MOD SED  10/27/2020   IR THROMBECT VENO MECH MOD SED  11/02/2020   IR US GUIDE VASC ACCESS LEFT  10/27/2020   IR US GUIDE VASC ACCESS RIGHT   09/26/2020   IR US GUIDE VASC ACCESS RIGHT  11/02/2020   IR VENO/EXT/UNI RIGHT  11/02/2020   IR VENOCAVAGRAM IVC  10/27/2020   RETINAL DETACHMENT SURGERY     right knee arthroscopy     TEE WITHOUT CARDIOVERSION N/A 09/27/2013   Procedure: TRANSESOPHAGEAL ECHOCARDIOGRAM (TEE);  Surgeon: Lelon Perla, MD;  Location: [facility] ENDOSCOPY;  Service: Cardiovascular;  Laterality: N/A;   WRIST SURGERY     Patient Active Problem List   Diagnosis Date Noted   Thrombosis    Sleep disturbance    Dysphagia, post-stroke    PAF (paroxysmal atrial fibrillation) ([place])    Acute pulmonary embolism without acute cor pulmonale (HCC)    Malnutrition of moderate degree 11/04/2020   Tracheostomy care ([place])    Pressure injury of skin 10/23/2020   Intracranial hemorrhage (HCC)    Atrial fibrillation (HCC)    Prediabetes    Leukocytosis    Acute blood loss anemia    Hypernatremia    ICH (intracerebral hemorrhage) ([place]) 09/25/2020   Unilateral primary osteoarthritis, left knee 09/26/2016   Atrial flutter ([place]) 09/25/2013   Near syncope 09/25/2013    REFERRING DIAG: I61.3 (ICD-10-CM) - 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 ([place]) I69.193 (ICD-10-CM) - 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 Decreased, improved ventricle size since 10/06/2020 with stable small volume of IVH. 2. Hematoma and edema along the right superior approach EVD tract is stable, including a small volume of bilateral SAH. 3. Expected evolution of the left midbrain/thalamic hemorrhage and edema. 4. Small additional scattered subacute infarcts remain largely occult by CT. 5. No new intracranial abnormality identified.     IMPRESSION: 1. Similar appearance of intraventricular hemorrhage as well as hemorrhage along prior EVD tract in the right frontal lobe and scattered  cortical subarachnoid hemorrhage. 2. Ventricular size remains stable. 3. No significant change in size of intraparenchymal hemorrhage centered within the left side of the midbrain extending into the thalamic pulvinar and superior cerebellar peduncle. 4. A few small foci of restricted diffusion within the left frontal and parietal lobes, corresponding to small acute/subacute infarcts  PRECAUTIONS: fall  SUBJECTIVE: No new c/o. Pt accompanied by CG Kelly   Objective: See treatment notes  Sit<>stand CGA for safety (very fast/impulsive)  Stand pivot t/f min/CGA  Scooting/squat pivot t/f CGA   TODAY'S TREATMENT:  02/05/21: Static stand on airex 30s hold no UE assist x2, EC x1 w/o UE support Sit to stand no HHA 10x from mat with SBA Standing deadlifts: 20lbs 10x, cues for slow lifting of weight to avoid retropulsion at end of deadlift when he stands up Gait training: 2 x 115 feet with RW, cues for standing up tall, keeping in middle of walker, tendency to veer L noted due to L eye patch Standing in walker, step taps to 4" block, 10x alternating pattern, 2 sets    02/02/21: Q-ped with elbow on bench and knees on mat table: alternating hip extensions: 10x R and L Tall kneeling: head/shoulder turns: horizontal: 10x, vertical: 10x Sit to stand no HHA: 0lbs 5x, 20lbs 5x, waiting for 5 sec and balancing before sitting down Standing deadlifts: 20lbs 2 x 5, waiting 5 sec before sitting down, cues for slow lifting of weight to avoid retropulsion at end of deadlift when he stands up Gait training: 1 x 405 feet with RW, cues for standing up tall, keeping in middle of walker to prevent R foot from hitting R back leg, smaller steps with L Leg, pt required min A due to occasional retropulsion, pt also had bil knees buckled and needed A due to fatigue. Standing on Wobble board: AP tilts: 1' with 1 HHA, 1' without HHA (min A, with cues to have kneesl locked and slight anterior lean to prevent  retropulusion)  01/30/21: Gait training: 1 x 230 feet with RW, one sitting break, cues for looking down at his feet, smaller steps, keeping wider BOS Min A required due to occaisonal LOB Sit to stand: 15lbs 2 x 5 with 5 sec hold for balance Standing with normal BOS: passing 6lb ball around body: 5x cw, 5x ccw; standing with narrow BOS: 5x cw, 5x ccw with 6lb ball around body Walking in parallel bars with R UE use: 10 x 10 feet fwd and bwd, used center separator board for last 5 reps to prevent legs crossing over. Cues for taking smaller steps, min A required.  01/24/21 Standing normal BOS: EO: 30 sec Standing normal BOS: EC: 1 min Standing normal BOS: EO, horizontal head turns: 20x Standing deadlifts with 15lb KB from floor: 10x, pt demo decreased WB to L LE Standing weight shifts: lateral and  anterior/posterior: 20x Sit to stand: 15lb KB in hands: from standard chair, tactile and verbal cues for "nose over toes" to prevent retropulsion. Standing DOT taps: 10x L only with 2lbs at ankle to improve coordination, 10x alternating with R and L, required min to mod A for balance without HHA Standing normal BOS: bil shoulder flexion holding ball in hand: 10x; big circles: 10x cw, 10x ccw, cues to improve lateral reaching to improve trunk rotation when performing circles. Fwd step ups 6" box 10x R and L with bil UE support    01/18/21 Sit<>stand x10  Seated heel slide with washrag x10  Standing heel slide forward with washrag 3x10 with targets  Standing heel slide to the side with washrag 3x10 with targets  Standing step forward and back on to targets 3x10  Standing balance with alternating UE raises 2x10  In // bars: forward stepping, pt cued to step on targets and "slide" foot to keep from L LE marching x 6 trips  Amb around gym 2x115' with tactile cues to keep from excessive L hip flexion, v/cs to "slide" L foot forward and slow down steps    01/16/21  Latissimus press 15x  Seated FAQs 2# alt  15x w/lat press  Seated marching 2# alt 15x w/lat press  Seated heel slides using towel 15x per side  Seated core exercises of hip and shoulder tosses, chops and Vs with 2.2# ball, 10x ea.   Seated OH lift with inspiration using 2.2# ball, 10x   Seated forward flexion with HHA from PT 10x  5xSTS with CGA diminishing to SBA  Lateral reaches over red physioball 10x ea.   01/08/21  Supine bridge, alternating marching, hip fallouts, alternating UE F/E all 2x15  Seated LAQs, marching, 2x15 performed with lat press    01/03/21 BERG score 11/56, 5x STS 35.9s with CGA Balance training in //bars, fwd and sidestep 10x per LE, marching alt pattern and heel/toe raises Stepping tasks fwd using 4" block, 10x per LE with fwd WS in // bars Latissimus press with deep inspiration 10 reps  Tapping floor targets, single target in center 10x alt in // bars 2 targets lateral of center, 10x alt in //bars    PATIENT EDUCATION: Education details: sit to stand instructions for safety Person educated: Patient and Spouse Education method: Explanation, Demonstration, and Verbal cues Education comprehension: verbalized understanding     HOME EXERCISE PROGRAM: Step/tapping on targets at home  ASSESSMENT:   CLINICAL IMPRESSION: Patient demos improved function as evidenced by ability to ambulate with CGA/MinA using RW, he now is able to stand pivot transfer from [facility] to mat with SBA and stand unsupported for 30s intervals.  All STGs met today  REHAB POTENTIAL: Good   CLINICAL DECISION MAKING: Stable/uncomplicated GOALS: Goals reviewed with patient? Yes   SHORT TERM GOALS:   STG Name Target Date Goal status  1 Patient will be able to perform chair to chair transfer with CGA/SBA with use of RW for safety to improve independence. Baseline: min to mod A (12/26/20) 01/23/2021 02/05/21 Able to transfer STS with SBA  2 Pt will be able to ambulate 115' with RW with CGA to min A with RW for safety to improve  independence Baseline: min to mod A with RW 145' 01/23/2021 02/05/21 Ambulation of 110fx2 with RW and CGA/minA   3 Pt will be able to maintain standing balance for 30 sec to improve static balance Baseline: 10 sec before needing help due to LOB (12/26/20) 01/23/2021  02/05/21 able to stand wit S for 30s, goal met    LONG TERM GOALS:   LTG Name Target Date Goal status  1 Pt will demo >5 points improvement on Berg Balance Scale to improve static balance Baseline: 11/56 01/03/21 02/20/2021 INITIAL  2 Pt will be able to ambulate >500 feet with RW and SBA to improve walking endurance and safety. Baseline: 145' with RW min to mod A 02/20/2021 INITIAL  3 Pt will be able to perform gait speed improvement by 0.13ms to improve community ambulation. Baseline:TBD 02/20/2021 INITIAL  4 Pt will demo 5 sec improvement in 5x sit to stand (with or without UE support) to improve functional strength Baseline: TBD 02/20/2021 INITIAL    PLAN: PT FREQUENCY: 2x/week   PT DURATION: 8 weeks   PLANNED INTERVENTIONS: Therapeutic exercises, Therapeutic activity, Neuro Muscular re-education, Balance training, Gait training, Patient/Family education, Joint mobilization, Stair training, Visual/preceptual remediation/compensation, Wheelchair mobility training, Cryotherapy, Moist heat, and Manual therapy   PLAN FOR NEXT SESSION: Standing tasks in // bars, floor targets and stepping tasks, [place]if[place]?     Patient will benefit from skilled therapeutic intervention in order to improve the following deficits and impairments:     Visit Diagnosis: Muscle weakness (generalized)  Ataxia  Unsteadiness on feet     Problem List Patient Active Problem List   Diagnosis Date Noted   Thrombosis    Sleep disturbance    Dysphagia, post-stroke    PAF (paroxysmal atrial fibrillation) (HCC)    Acute pulmonary embolism without acute cor pulmonale (HCC)    Malnutrition of moderate degree 11/04/2020   [place]ache[place] care ([place]    Pressure  injury of skin 10/23/2020   Intracranial hemorrhage (HCC)    Atrial fibrillation (HCC)    Prediabetes    Leukocytosis    Acute blood loss anemia    Hypernatremia    ICH (intracerebral hemorrhage) ([place] 09/25/2020   Unilateral primary osteoarthritis, left knee 09/26/2016   [place]rial [place]utter ([place] 09/25/2013   Near syncope 09/25/2013   Physical Therapy Progress Note   Dates of Reporting Period:12/26/20-02/05/21  Thank you for the referral of this patient.  JLanice ShirtsPT 02/05/2021, 12:37 PM  [place] [address]Phone: [phone]  Fax:  [phone] Name: Walter NOLDENMRN: [id number] of Birth: 309-30-1957

## 2021-02-07 ENCOUNTER — Ambulatory Visit: Payer: Medicare Other | Admitting: Occupational Therapy

## 2021-02-07 ENCOUNTER — Other Ambulatory Visit: Payer: Self-pay

## 2021-02-07 ENCOUNTER — Ambulatory Visit: Payer: Medicare Other

## 2021-02-07 ENCOUNTER — Ambulatory Visit: Payer: Medicare Other | Admitting: Speech Pathology

## 2021-02-07 ENCOUNTER — Encounter: Payer: Self-pay | Admitting: Speech Pathology

## 2021-02-07 DIAGNOSIS — R471 Dysarthria and anarthria: Secondary | ICD-10-CM | POA: Diagnosis not present

## 2021-02-07 DIAGNOSIS — R2681 Unsteadiness on feet: Secondary | ICD-10-CM

## 2021-02-07 DIAGNOSIS — R208 Other disturbances of skin sensation: Secondary | ICD-10-CM

## 2021-02-07 DIAGNOSIS — R41844 Frontal lobe and executive function deficit: Secondary | ICD-10-CM

## 2021-02-07 DIAGNOSIS — I69218 Other symptoms and signs involving cognitive functions following other nontraumatic intracranial hemorrhage: Secondary | ICD-10-CM

## 2021-02-07 DIAGNOSIS — R41841 Cognitive communication deficit: Secondary | ICD-10-CM

## 2021-02-07 DIAGNOSIS — R41842 Visuospatial deficit: Secondary | ICD-10-CM

## 2021-02-07 DIAGNOSIS — R278 Other lack of coordination: Secondary | ICD-10-CM

## 2021-02-07 NOTE — Therapy (Signed)
[facility] [address] [place], [place], [address] Phone: [phone]   Fax:  [phone]  Occupational Therapy Treatment  Patient Details  Name: Walter [name] MRN: [id number] Date of Birth: 1956-01-17 Referring Provider (OT): Dr. Alysia Penna   Encounter Date: 02/07/2021   OT End of Session - 02/07/21 1614     Visit Number 9    Number of Visits 40    Date for OT Re-Evaluation 04/27/21    Authorization Type Medicare Part A & B    Authorization Time Period 02/02/21-updated plan for 3 x week x 12 weeks, anticipate d/c after 8 weeks    Authorization - Visit Number 9    Authorization - Number of Visits 10    Progress Note Due on Visit 10    OT Start Time 1318    OT Stop Time 1400    OT Time Calculation (min) 42 min             Past Medical History:  Diagnosis Date   Atrial flutter ([place])    s/p ablation   DVT (deep venous thrombosis) ([place])    Near syncope 09/25/2013   Paroxysmal atrial fibrillation ([place])    Stroke [facility])    initially hemorragic (not on [place]) followed by subsequent embolic stroke    Past Surgical History:  Procedure Laterality Date   A-FLUTTER ABLATION N/A 03/09/2020   Procedure: A-FLUTTER ABLATION;  Surgeon: Thompson Grayer, MD;  Location: [place] CV LAB;  Service: Cardiovascular;  Laterality: N/A;   CARDIOVERSION N/A 09/27/2013   Procedure: CARDIOVERSION;  Surgeon: Lelon Perla, MD;  Location: [facility] ENDOSCOPY;  Service: Cardiovascular;  Laterality: N/A;   IR ANGIO EXTERNAL CAROTID SEL EXT CAROTID UNI R MOD SED  09/26/2020   IR ANGIO INTRA EXTRACRAN SEL COM CAROTID INNOMINATE BILAT MOD SED  09/26/2020   IR ANGIO VERTEBRAL SEL VERTEBRAL UNI R MOD SED  09/26/2020   IR IVC FILTER PLMT / S&I /IMG GUID/MOD SED  10/08/2020   IR IVUS EACH ADDITIONAL NON CORONARY VESSEL  11/02/2020   IR PTA VENOUS EXCEPT DIALYSIS CIRCUIT  11/02/2020   IR RADIOLOGIST EVAL & MGMT  01/17/2021   IR THROMBECT VENO MECH  MOD SED  10/27/2020   IR THROMBECT VENO MECH MOD SED  11/02/2020   IR US GUIDE VASC ACCESS LEFT  10/27/2020   IR US GUIDE VASC ACCESS RIGHT  09/26/2020   IR US GUIDE VASC ACCESS RIGHT  11/02/2020   IR VENO/EXT/UNI RIGHT  11/02/2020   IR VENOCAVAGRAM IVC  10/27/2020   RETINAL DETACHMENT SURGERY     right knee arthroscopy     TEE WITHOUT CARDIOVERSION N/A 09/27/2013   Procedure: TRANSESOPHAGEAL ECHOCARDIOGRAM (TEE);  Surgeon: Lelon Perla, MD;  Location: [facility] ENDOSCOPY;  Service: Cardiovascular;  Laterality: N/A;   WRIST SURGERY      There were no vitals filed for this visit.   Subjective Assessment - 02/07/21 1614     Subjective  Hand pain    Pertinent History ICH of brainstem (complicated by hydrocephalus, DVTs, and tracheostomy).  PMH:  a-fib    Limitations fall risk, ataxia, impulsivity    Patient Stated Goals use L side of body better (decrease ataxia), stay active and heal brain    Currently in Pain? Yes    Pain Score 5     Pain Location Hand    Pain Orientation Right    Pain Descriptors / Indicators Aching    Pain Type Chronic pain  Pain Onset More than a month ago    Pain Frequency Intermittent    Aggravating Factors  unknown    Pain Relieving Factors exercises                      Treatment: Tendon gliding exercises and composite finger flexion for RUE followed by flicking cards for finger flexion/ extension, flipping cards with LUE min-mod v.c Copying peg design with right and left UE's for to address visual perceptual skills, coordination and cognition, min difficulty, min v.c for performance. Increased time required. [name] was present.              OT Short Term Goals - 02/05/21 1336       OT SHORT TERM GOAL #1   Title Pt/caregiver will be independent with initial HEP for LUE coordination and R hand strength.--    Time 4    Period Weeks    Status On-going   needs continued reinforcement, pt is not using consistently   Target Date 03/02/21       OT SHORT TERM GOAL #2   Title Pt/caregiver will be independent with vision HEP and visual compensation strategies.    Time 4    Period Weeks    Status On-going   Pt/ family verbalize understanding of vision HEP, needs continued reinforcement of compensatory strategeis- pt is not performing head turns     OT SHORT TERM GOAL #3   Title Pt will improve LUE coordination for ADLs as shown by improving score on box and blocks test by at least 6.    Baseline L-21 blocks    Time 4    Period Weeks    Status On-going   25 blocks     OT SHORT TERM GOAL #4   Title Pt will perform simple snack prep/home maintenance task from w/c level mod I.    Time 4    Period Weeks    Status Achieved   met per pt's wife     OT SHORT TERM GOAL #5   Title Pt will verbalize understanding of compensation strategies for sensory deficts for incr safety.    Time 4    Period Weeks    Status On-going   Pt verbalized that he has to be careful about temperature, needs additional reinforcement     OT SHORT TERM GOAL #6   Title Pt will improve R hand grip stength to at least 32lbs to assist with ADLs/opening containers.    Time 4    Period Weeks    Status Achieved   33.5     OT SHORT TERM GOAL #7   Title Pt will demonstrate improved LUE control for ADLs as evidenced by perfroming at least 26 blocks on box/ blocks test.    Time 4    Period Weeks    Status Deferred   Pt already has box/ blocks goal              OT Long Term Goals - 02/02/21 1416       OT LONG TERM GOAL #1   Title Pt will be independent with updated HEP--    Time 12    Period Weeks    Status On-going    Target Date 04/27/21      OT LONG TERM GOAL #2   Title Pt will improve L hand coordination for ADLs as shown by completing 9-hole peg test in less than 70sec.    Baseline 107sec  Time 12    Period Weeks    Status On-going      OT LONG TERM GOAL #3   Title Pt will be mod I with toileting.    Time 12    Period Weeks     Status On-going      OT LONG TERM GOAL #4   Title Pt will perform simple snack prep/home maintenance tasks in standing with supervision.    Time 12    Period Weeks    Status On-going      OT LONG TERM GOAL #5   Title Pt will demo good safety awareness/compensation for sensory deficits and attention to LUE during functional tasks and transfers without cueing.    Time 12    Period Weeks    Status On-going      OT LONG TERM GOAL #6   Title Pt will perform simple environmental scanning/navigation with at least 90% accuracy for incr safety.    Time 12    Period Weeks    Status On-going      OT LONG TERM GOAL #7   Title Pt will perform all basic ADLS with no more than supervision.    Time 12    Period Weeks    Status New                   Plan - 02/07/21 1615     Clinical Impression Statement Pt is progressing towards goals. Pt demonstrates improving bilateral functional use of UE's    OT Occupational Profile and History Detailed Assessment- Review of Records and additional review of physical, cognitive, psychosocial history related to current functional performance    Occupational performance deficits (Please refer to evaluation for details): ADL's;IADL's;Work;Leisure;Social Participation    Body Structure / Function / Physical Skills ADL;Strength;Balance;Proprioception;UE functional use;IADL;Endurance;Vision;Mobility;Coordination;Decreased knowledge of precautions;FMC;GMC;Sensation    Cognitive Skills Attention;Safety Awareness;Memory;Perception;Problem Solve    Rehab Potential Good    Clinical Decision Making Several treatment options, min-mod task modification necessary    Comorbidities Affecting Occupational Performance: May have comorbidities impacting occupational performance    Modification or Assistance to Complete Evaluation  Min-Moderate modification of tasks or assist with assess necessary to complete eval    OT Frequency 3x / week   updated frequency may  decrease after 4 weeks   OT Duration 12 weeks   written for 12 weeks however may d/c after 8 depenent upon progress.   OT Treatment/Interventions Self-care/ADL training;Moist Heat;Fluidtherapy;DME and/or AE instruction;Balance training;Therapeutic activities;Aquatic Therapy;Ultrasound;Therapeutic exercise;Cognitive remediation/compensation;Visual/perceptual remediation/compensation;Functional Mobility Training;Neuromuscular education;Cryotherapy;Energy conservation;Manual Therapy;Patient/family education    Plan strategies for ataxia, functional reaching, standing balance    Consulted and Agree with Plan of Care Family member/caregiver;Patient    Family Member Consulted wife             Patient will benefit from skilled therapeutic intervention in order to improve the following deficits and impairments:   Body Structure / Function / Physical Skills: ADL, Strength, Balance, Proprioception, UE functional use, IADL, Endurance, Vision, Mobility, Coordination, Decreased knowledge of precautions, FMC, GMC, Sensation Cognitive Skills: Attention, Safety Awareness, Memory, Perception, Problem Solve     Visit Diagnosis: Unsteadiness on feet  Other lack of coordination  Other disturbances of skin sensation  Visuospatial deficit  Frontal lobe and executive function deficit  Other symptoms and signs involving cognitive functions following other nontraumatic intracranial hemorrhage    Problem List Patient Active Problem List   Diagnosis Date Noted   Thrombosis    Sleep disturbance  Dysphagia, post-stroke    PAF (paroxysmal atrial fibrillation) (HCC)    Acute pulmonary embolism without acute cor pulmonale (HCC)    Malnutrition of moderate degree 11/04/2020   Tracheostomy care ([place])    Pressure injury of skin 10/23/2020   Intracranial hemorrhage (H[facility])    Atrial fibrillation (H[facility])    Prediabetes    Leukocytosis    Acute blood loss anemia    Hypernatremia    ICH (intracerebral  hemorrhage) ([place]) 09/25/2020   Unilateral primary osteoarthritis, left knee 09/26/2016   Atrial flutter ([place]) 09/25/2013   Near syncope 09/25/2013    , 02/07/2021, 4:16 PM  [facility] [address] [place], [place], [address] Phone: [phone]   Fax:  [phone]  Name: Walter [name] MRN: [id number] Date of Birth: 1955-10-20

## 2021-02-07 NOTE — Therapy (Signed)
[facility] [address] [place], [place], [address] Phone: [phone]   Fax:  [phone]  Speech Language Pathology Treatment  Patient Details  Name: MARGIE BRINK MRN: [id number] Date of Birth: 07-Jan-1956 Referring Provider (SLP): Charlett Blake, MD   Encounter Date: 02/07/2021   End of Session - 02/07/21 1547     Visit Number 12    Number of Visits 17    Date for SLP Re-Evaluation 02/21/21    Authorization Type Medicare    SLP Start Time 7903    SLP Stop Time  8333    SLP Time Calculation (min) 43 min    Activity Tolerance Patient tolerated treatment well             Past Medical History:  Diagnosis Date   Atrial flutter ([place])    s/p ablation   DVT (deep venous thrombosis) ([place])    Near syncope 09/25/2013   Paroxysmal atrial fibrillation ([place])    Stroke [facility])    initially hemorragic (not on [facility]) followed by subsequent embolic stroke    Past Surgical History:  Procedure Laterality Date   A-FLUTTER ABLATION N/A 03/09/2020   Procedure: A-FLUTTER ABLATION;  Surgeon: Thompson Grayer, MD;  Location: [place] CV LAB;  Service: Cardiovascular;  Laterality: N/A;   CARDIOVERSION N/A 09/27/2013   Procedure: CARDIOVERSION;  Surgeon: Lelon Perla, MD;  Location: [facility] ENDOSCOPY;  Service: Cardiovascular;  Laterality: N/A;   IR ANGIO EXTERNAL CAROTID SEL EXT CAROTID UNI R MOD SED  09/26/2020   IR ANGIO INTRA EXTRACRAN SEL COM CAROTID INNOMINATE BILAT MOD SED  09/26/2020   IR ANGIO VERTEBRAL SEL VERTEBRAL UNI R MOD SED  09/26/2020   IR IVC FILTER PLMT / S&I /IMG GUID/MOD SED  10/08/2020   IR IVUS EACH ADDITIONAL NON CORONARY VESSEL  11/02/2020   IR PTA VENOUS EXCEPT DIALYSIS CIRCUIT  11/02/2020   IR RADIOLOGIST EVAL & MGMT  01/17/2021   IR THROMBECT VENO MECH MOD SED  10/27/2020   IR THROMBECT VENO MECH MOD SED  11/02/2020   IR US GUIDE VASC ACCESS LEFT  10/27/2020   IR US GUIDE VASC ACCESS RIGHT  09/26/2020   IR US  GUIDE VASC ACCESS RIGHT  11/02/2020   IR VENO/EXT/UNI RIGHT  11/02/2020   IR VENOCAVAGRAM IVC  10/27/2020   RETINAL DETACHMENT SURGERY     right knee arthroscopy     TEE WITHOUT CARDIOVERSION N/A 09/27/2013   Procedure: TRANSESOPHAGEAL ECHOCARDIOGRAM (TEE);  Surgeon: Lelon Perla, MD;  Location: [facility] ENDOSCOPY;  Service: Cardiovascular;  Laterality: N/A;   WRIST SURGERY      There were no vitals filed for this visit.   Subjective Assessment - 02/07/21 1404     Subjective Breathe Breathe and Breathe some more    Patient is accompained by: Family member   [name]   Currently in Pain? Yes    Pain Score 5     Pain Location Hand    Pain Orientation Right    Pain Descriptors / Indicators Aching    Pain Type Chronic pain    Multiple Pain Sites Yes                   ADULT SLP TREATMENT - 02/07/21 1406       General Information   Behavior/Cognition Alert;Cooperative;Pleasant mood;Requires cueing      Treatment Provided   Treatment provided Cognitive-Linquistic      Cognitive-Linquistic Treatment   Treatment focused on Cognition;Dysarthria;Patient/family/caregiver  education    Skilled Treatment Reviewed abdominal breathing (AB) with sniff blow with usual mod A to reduce clavicular breathing. Pt completed loud /a/ to recalibrate volume and read personally relevant sentences with WNL volume. Task coombining reading and spontaneous speech with WNL volume reading lists of 4 words, however he required occasional min to mod A to maintain volume in spontaneous utterance. Targeted carryover of volume to spontaneous speech generating 3-4 sentence descriptions of animals with adequate volume 15/20 sentences with frequent min to mod verbal cues for volume and breath support. Overall slow processisng. Simple responses in conversation although audible in this quiet room did required cues for breathing and volume to generate WNL volume      Assessment / Recommendations / Plan   Plan Continue with  current plan of care      Progression Toward Goals   Progression toward goals Progressing toward goals              SLP Education - 02/07/21 1545     Education Details continue HEP for dysarthria    Person(s) Educated Spouse    Methods Explanation;Demonstration    Comprehension Verbalized understanding;Returned demonstration;Verbal cues required;Need further instruction              SLP Short Term Goals - 02/07/21 1546       SLP SHORT TERM GOAL #1   Title Pt will complete dysarthria HEP with occasional min A over 2 sessions    Time 1    Period Weeks    Status Not Met      SLP SHORT TERM GOAL #2   Title Pt will produce loud /a/ or "hey!" with at least high 80s dB average over 2 sessions    Time 1    Period Weeks    Status Not Met      SLP SHORT TERM GOAL #3   Title Pt will generate abdominal breathing in 16/20 answers to SLP questions over 2 sessions    Baseline 01-18-21    Time 1    Period Weeks    Status Partially Met      SLP SHORT TERM GOAL #4   Title Pt will demonstrate dysarthria compensations in 10 minute simple to mod complex conversation to be 75% intelligibile with occasional min A over 2 sessions    Time 1    Period Weeks    Status Not Met      SLP SHORT TERM GOAL #5   Title Pt will complete formal cognitive linguistic assessment in first 1-2 ST sessions    Status Achieved      SLP SHORT TERM GOAL #6   Title Pt will use memory and attention compensations for appointments, medicine management, and other daily activities with occasional min A over 2 sessions    Time 1    Period Weeks    Status Not Met              SLP Long Term Goals - 02/07/21 1547       SLP LONG TERM GOAL #1   Title Pt will complete dysarthria HEP with rare min A over 2 sessions    Time 3    Period Weeks    Status On-going      SLP LONG TERM GOAL #2   Title Pt will demonstrate dysarthria compensations in 15+ minute simple to mod complex conversation to be 90%  intelligibile with rare min A over 2 sessions    Time 3  Period Weeks    Status On-going      SLP LONG TERM GOAL #3   Title Pt will demo improved sustained and selective attention during therapeutic tasks with less than 3 cues for active listening during 2 sessions    Time 3    Period Weeks    Status On-going      SLP LONG TERM GOAL #4   Title Pt will demonstrate emergent and anticipatory awareness of impulsivity with less than 5 cues to modify behaviors during 2 sessions    Time 3    Period Weeks    Status On-going      SLP LONG TERM GOAL #5   Title Pt will report reduced frustration and improved communication effectiveness via PROM by 2 points at last ST sessions    Baseline CES: 15.5 & V-RQOL:35    Time 3    Period Weeks    Status On-going              Plan - 02/07/21 1546     Clinical Impression Statement "[name]" presents for OPST intervention secondary to stroke in March 2022. SLP continued education and training pt and caregiver in breath support, and audible, intelligible speech. Intermittent cues required to sustain hand placement on abdomen for abdominal breathing. SLP introduced sniff-sniff-blow to facilitate abdominal breathing. WNL volume exhibited on ~90% of structured tasks. Skilled ST is cont'd warranted to address dysarthria and cognition to maximize communication effectiveness and optimize functional independence and safety at home.    Speech Therapy Frequency 3x / week    Duration 8 weeks    Treatment/Interventions Cueing hierarchy;Functional tasks;Patient/family education;Cognitive reorganization;Multimodal communcation approach;Language facilitation;Compensatory techniques;Internal/external aids;SLP instruction and feedback    Potential to Achieve Goals Fair    Potential Considerations Ability to learn/carryover information;Cooperation/participation level             Patient will benefit from skilled therapeutic intervention in order to improve the  following deficits and impairments:   Dysarthria and anarthria  Cognitive communication deficit    Problem List Patient Active Problem List   Diagnosis Date Noted   Thrombosis    Sleep disturbance    Dysphagia, post-stroke    PAF (paroxysmal atrial fibrillation) ([place])    Acute pulmonary embolism without acute cor pulmonale (HCC)    Malnutrition of moderate degree 11/04/2020   Tracheostomy care ([place])    Pressure injury of skin 10/23/2020   Intracranial hemorrhage (HCC)    Atrial fibrillation ([place])    Prediabetes    Leukocytosis    Acute blood loss anemia    Hypernatremia    ICH (intracerebral hemorrhage) ([place]) 09/25/2020   Unilateral primary osteoarthritis, left knee 09/26/2016   Atrial flutter ([place]) 09/25/2013   Near syncope 09/25/2013    , [place], [place] 02/07/2021, 3:47 PM  [facility] [address] [place], [place], [address] Phone: [phone]   Fax:  [phone]   Name: ZANDER INGHAM MRN: [id number] Date of Birth: July 10, 1955

## 2021-02-08 ENCOUNTER — Ambulatory Visit: Payer: Medicare Other

## 2021-02-08 ENCOUNTER — Ambulatory Visit: Payer: Medicare Other | Admitting: Occupational Therapy

## 2021-02-08 DIAGNOSIS — R208 Other disturbances of skin sensation: Secondary | ICD-10-CM

## 2021-02-08 DIAGNOSIS — R278 Other lack of coordination: Secondary | ICD-10-CM

## 2021-02-08 DIAGNOSIS — R2681 Unsteadiness on feet: Secondary | ICD-10-CM

## 2021-02-08 DIAGNOSIS — R41842 Visuospatial deficit: Secondary | ICD-10-CM

## 2021-02-08 DIAGNOSIS — R270 Ataxia, unspecified: Secondary | ICD-10-CM

## 2021-02-08 DIAGNOSIS — I69218 Other symptoms and signs involving cognitive functions following other nontraumatic intracranial hemorrhage: Secondary | ICD-10-CM

## 2021-02-08 DIAGNOSIS — M6281 Muscle weakness (generalized): Secondary | ICD-10-CM

## 2021-02-08 DIAGNOSIS — R41844 Frontal lobe and executive function deficit: Secondary | ICD-10-CM

## 2021-02-08 DIAGNOSIS — R471 Dysarthria and anarthria: Secondary | ICD-10-CM | POA: Diagnosis not present

## 2021-02-08 NOTE — Therapy (Addendum)
[facility] [address] [place], [place], [address] Phone: [phone]   Fax:  [phone]  Occupational Therapy Treatment  Patient Details  Name: Walter [name] MRN: [id number] Date of Birth: 03/30/1956 Referring Provider (OT): Dr. Alysia Penna   Encounter Date: 02/08/2021   OT End of Session - 02/08/21 1635     Visit Number 10    Number of Visits 30    Date for OT Re-Evaluation 04/27/21    Authorization Type Medicare Part A & B    Authorization Time Period 02/02/21-updated plan for 3 x week x 12 weeks, anticipate d/c after 8 weeks    Authorization - Visit Number 10    Authorization - Number of Visits 10    Progress Note Due on Visit 10    OT Start Time 1403    OT Stop Time 1445    OT Time Calculation (min) 42 min    Activity Tolerance Patient tolerated treatment well    Behavior During Therapy Impulsive             Past Medical History:  Diagnosis Date   Atrial flutter ([place])    s/p ablation   DVT (deep venous thrombosis) ([place])    Near syncope 09/25/2013   Paroxysmal atrial fibrillation ([place])    Stroke ([place])    initially hemorragic (not on [place]) followed by subsequent embolic stroke    Past Surgical History:  Procedure Laterality Date   A-FLUTTER ABLATION N/A 03/09/2020   Procedure: A-FLUTTER ABLATION;  Surgeon: Thompson Grayer, MD;  Location: [facility];  Service: Cardiovascular;  Laterality: N/A;   CARDIOVERSION N/A 09/27/2013   Procedure: CARDIOVERSION;  Surgeon: Lelon Perla, MD;  Location: [facility] ENDOSCOPY;  Service: Cardiovascular;  Laterality: N/A;   IR ANGIO EXTERNAL CAROTID SEL EXT CAROTID UNI R MOD SED  09/26/2020   IR ANGIO INTRA EXTRACRAN SEL COM CAROTID INNOMINATE BILAT MOD SED  09/26/2020   IR ANGIO VERTEBRAL SEL VERTEBRAL UNI R MOD SED  09/26/2020   IR IVC FILTER PLMT / S&I /IMG GUID/MOD SED  10/08/2020   IR IVUS EACH ADDITIONAL NON CORONARY VESSEL  11/02/2020   IR PTA VENOUS EXCEPT  DIALYSIS CIRCUIT  11/02/2020   IR RADIOLOGIST EVAL & MGMT  01/17/2021   IR THROMBECT VENO MECH MOD SED  10/27/2020   IR THROMBECT VENO MECH MOD SED  11/02/2020   IR US GUIDE VASC ACCESS LEFT  10/27/2020   IR US GUIDE VASC ACCESS RIGHT  09/26/2020   IR US GUIDE VASC ACCESS RIGHT  11/02/2020   IR VENO/EXT/UNI RIGHT  11/02/2020   IR VENOCAVAGRAM IVC  10/27/2020   RETINAL DETACHMENT SURGERY     right knee arthroscopy     TEE WITHOUT CARDIOVERSION N/A 09/27/2013   Procedure: TRANSESOPHAGEAL ECHOCARDIOGRAM (TEE);  Surgeon: Lelon Perla, MD;  Location: [facility] ENDOSCOPY;  Service: Cardiovascular;  Laterality: N/A;   WRIST SURGERY      There were no vitals filed for this visit.  Pain: Pt denies pain today   Treatment:Pt brought  in red putty, therapist reviewed HEP for grip and pinch for bilateral UE's with pt/ wife, min v.c Pt/ wife were educated regarding activities on constant therapy for HEP. Pt performed the following activities with min v.c:  Alternating symbols: 95% accuracy for visual perceptual skills Reading a map level 1 for vision/cognition- min v.c:  83% And visual memory 90% accuracy, min v.c Pt and wife verbalize understanding and plan to pursue.  OT Short Term Goals - 02/08/21 1443       OT SHORT TERM GOAL #1   Title Pt/caregiver will be independent with initial HEP for LUE coordination and R hand strength.--    Time 4    Period Weeks    Status Achieved   needs continued reinforcement, pt is not using consistently   Target Date 03/02/21      OT SHORT TERM GOAL #2   Title Pt/caregiver will be independent with vision HEP and visual compensation strategies.    Time 4    Period Weeks    Status On-going   Pt/ family verbalize understanding of vision HEP, needs continued reinforcement of compensatory strategeis- pt is not performing head turns     OT SHORT TERM GOAL #3   Title Pt will improve LUE coordination for ADLs as shown by improving  score on box and blocks test by at least 6.    Baseline L-21 blocks    Time 4    Period Weeks    Status On-going   25 blocks     OT SHORT TERM GOAL #4   Title Pt will perform simple snack prep/home maintenance task from w/c level mod I.    Time 4    Period Weeks    Status Achieved   met per pt's wife     OT SHORT TERM GOAL #5   Title Pt will verbalize understanding of compensation strategies for sensory deficts for incr safety.    Time 4    Period Weeks    Status On-going   Pt verbalized that he has to be careful about temperature, needs additional reinforcement     OT SHORT TERM GOAL #6   Title Pt will improve R hand grip stength to at least 32lbs to assist with ADLs/opening containers.    Time 4    Period Weeks    Status Achieved   33.5     OT SHORT TERM GOAL #7   Title Pt will demonstrate improved LUE control for ADLs as evidenced by perfroming at least 26 blocks on box/ blocks test.    Time 4    Period Weeks    Status Deferred   Pt already has box/ blocks goal              OT Long Term Goals - 02/02/21 1416       OT LONG TERM GOAL #1   Title Pt will be independent with updated HEP--    Time 12    Period Weeks    Status On-going    Target Date 04/27/21      OT LONG TERM GOAL #2   Title Pt will improve L hand coordination for ADLs as shown by completing 9-hole peg test in less than 70sec.    Baseline 107sec    Time 12    Period Weeks    Status On-going      OT LONG TERM GOAL #3   Title Pt will be mod I with toileting.    Time 12    Period Weeks    Status On-going      OT LONG TERM GOAL #4   Title Pt will perform simple snack prep/home maintenance tasks in standing with supervision.    Time 12    Period Weeks    Status On-going      OT LONG TERM GOAL #5   Title Pt will demo good safety awareness/compensation for sensory deficits and attention to  LUE during functional tasks and transfers without cueing.    Time 12    Period Weeks    Status  On-going      OT LONG TERM GOAL #6   Title Pt will perform simple environmental scanning/navigation with at least 90% accuracy for incr safety.    Time 12    Period Weeks    Status On-going      OT LONG TERM GOAL #7   Title Pt will perform all basic ADLS with no more than supervision.    Time 12    Period Weeks    Status New                   Plan - 02/08/21 1636     Clinical Impression Statement For the reporting period of 12/26/20-02/08/21 pt is progressing towards goals. Se goals for progress. Pt has not fully met goals due to the severity of deficits with ataxia, balance, visual deficits, decreased coordination and decreased sensation. Pt can benefit from skilled occupational therapy to maximize pt's safety and I with ADLS/ IADLS.    OT Occupational Profile and History Detailed Assessment- Review of Records and additional review of physical, cognitive, psychosocial history related to current functional performance    Occupational performance deficits (Please refer to evaluation for details): ADL's;IADL's;Work;Leisure;Social Participation    Body Structure / Function / Physical Skills ADL;Strength;Balance;Proprioception;UE functional use;IADL;Endurance;Vision;Mobility;Coordination;Decreased knowledge of precautions;FMC;GMC;Sensation    Cognitive Skills Attention;Safety Awareness;Memory;Perception;Problem Solve    Rehab Potential Good    Clinical Decision Making Several treatment options, min-mod task modification necessary    Comorbidities Affecting Occupational Performance: May have comorbidities impacting occupational performance    Modification or Assistance to Complete Evaluation  Min-Moderate modification of tasks or assist with assess necessary to complete eval    OT Frequency 3x / week   updated frequency may decrease after 4 weeks   OT Duration 12 weeks   written for 12 weeks however may d/c after 8 depenent upon progress.   OT Treatment/Interventions Self-care/ADL  training;Moist Heat;Fluidtherapy;DME and/or AE instruction;Balance training;Therapeutic activities;Aquatic Therapy;Ultrasound;Therapeutic exercise;Cognitive remediation/compensation;Visual/perceptual remediation/compensation;Functional Mobility Training;Neuromuscular education;Cryotherapy;Energy conservation;Manual Therapy;Patient/family education    Plan strategies for ataxia, functional reaching, standing balance    Consulted and Agree with Plan of Care Family member/caregiver;Patient    Family Member Consulted wife             Patient will benefit from skilled therapeutic intervention in order to improve the following deficits and impairments:   Body Structure / Function / Physical Skills: ADL, Strength, Balance, Proprioception, UE functional use, IADL, Endurance, Vision, Mobility, Coordination, Decreased knowledge of precautions, FMC, GMC, Sensation Cognitive Skills: Attention, Safety Awareness, Memory, Perception, Problem Solve     Visit Diagnosis: Visuospatial deficit  Frontal lobe and executive function deficit  Other symptoms and signs involving cognitive functions following other nontraumatic intracranial hemorrhage  Other disturbances of skin sensation  Muscle weakness (generalized)  Other lack of coordination    Problem List Patient Active Problem List   Diagnosis Date Noted   Thrombosis    Sleep disturbance    Dysphagia, post-stroke    PAF (paroxysmal atrial fibrillation) (HCC)    Acute pulmonary embolism without acute cor pulmonale (HCC)    Malnutrition of moderate degree 11/04/2020   Tracheostomy care ([place])    Pressure injury of skin 10/23/2020   Intracranial hemorrhage (HCC)    Atrial fibrillation (HCC)    Prediabetes    Leukocytosis    Acute blood loss anemia    Hypernatremia  ICH (intracerebral hemorrhage) ([place]) 09/25/2020   Unilateral primary osteoarthritis, left knee 09/26/2016   Atrial flutter ([place]) 09/25/2013   Near syncope 09/25/2013     Walter [name] 02/08/2021, 4:39 PM  [facility] [address] [place], [place], [address] Phone: [phone]   Fax:  [phone]  Name: Walter [name] MRN: [id number] Date of Birth: 04-Jul-1955

## 2021-02-08 NOTE — Therapy (Signed)
Date of Birth: 25-Jul-1955 OUTPATIENT PHYSICAL THERAPY TREATMENT NOTE/10th Visit Progress Note   Patient Name: Walter [name] MRN: [id number] DOB:21-Oct-1955, 65 y.o., male Today's Date: 02/08/2021  PCP: Alroy Dust, L.Marlou Sa, MD REFERRING PROVIDER: Alroy Dust, Carlean Jews.Marlou Sa, MD     PT End of Session - 02/08/21 1423     Visit Number 11    Number of Visits 17    Date for PT Re-Evaluation 02/20/21    Authorization Type Medicare 12/26/20 to 02/20/21    Progress Note Due on Visit 20    PT Start Time 0347    PT Stop Time 1530    PT Time Calculation (min) 45 min    Equipment Utilized During Treatment Gait belt    Activity Tolerance Patient tolerated treatment well              Past Medical History:  Diagnosis Date   Atrial flutter ([place])    s/p ablation   DVT (deep venous thrombosis) ([place])    Near syncope 09/25/2013   Paroxysmal atrial fibrillation ([place])    Stroke [facility])    initially hemorragic (not on [place]) followed by subsequent embolic stroke   Past Surgical History:  Procedure Laterality Date   A-FLUTTER ABLATION N/A 03/09/2020   Procedure: A-FLUTTER ABLATION;  Surgeon: Thompson Grayer, MD;  Location: [facility] LAB;  Service: Cardiovascular;  Laterality: N/A;   CARDIOVERSION N/A 09/27/2013   Procedure: CARDIOVERSION;  Surgeon: Lelon Perla, MD;  Location: [facility] ENDOSCOPY;  Service: Cardiovascular;  Laterality: N/A;   IR ANGIO EXTERNAL CAROTID SEL EXT CAROTID UNI R MOD SED  09/26/2020   IR ANGIO INTRA EXTRACRAN SEL COM CAROTID INNOMINATE BILAT MOD SED  09/26/2020   IR ANGIO VERTEBRAL SEL VERTEBRAL UNI R MOD SED  09/26/2020   IR IVC FILTER PLMT / S&I /IMG GUID/MOD SED  10/08/2020   IR IVUS EACH ADDITIONAL NON CORONARY VESSEL  11/02/2020   IR PTA VENOUS EXCEPT DIALYSIS CIRCUIT  11/02/2020   IR RADIOLOGIST EVAL & MGMT  01/17/2021   IR THROMBECT VENO MECH MOD SED  10/27/2020   IR THROMBECT VENO MECH MOD SED  11/02/2020   IR US GUIDE VASC ACCESS LEFT  10/27/2020   IR US GUIDE VASC ACCESS RIGHT   09/26/2020   IR US GUIDE VASC ACCESS RIGHT  11/02/2020   IR VENO/EXT/UNI RIGHT  11/02/2020   IR VENOCAVAGRAM IVC  10/27/2020   RETINAL DETACHMENT SURGERY     right knee arthroscopy     TEE WITHOUT CARDIOVERSION N/A 09/27/2013   Procedure: TRANSESOPHAGEAL ECHOCARDIOGRAM (TEE);  Surgeon: Lelon Perla, MD;  Location: [facility] ENDOSCOPY;  Service: Cardiovascular;  Laterality: N/A;   WRIST SURGERY     Patient Active Problem List   Diagnosis Date Noted   Thrombosis    Sleep disturbance    Dysphagia, post-stroke    PAF (paroxysmal atrial fibrillation) ([place])    Acute pulmonary embolism without acute cor pulmonale (HCC)    Malnutrition of moderate degree 11/04/2020   Tracheostomy care ([place])    Pressure injury of skin 10/23/2020   Intracranial hemorrhage (HCC)    Atrial fibrillation (HCC)    Prediabetes    Leukocytosis    Acute blood loss anemia    Hypernatremia    ICH (intracerebral hemorrhage) ([place]) 09/25/2020   Unilateral primary osteoarthritis, left knee 09/26/2016   Atrial flutter ([place]) 09/25/2013   Near syncope 09/25/2013    REFERRING DIAG: I61.3 (ICD-10-CM) - 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 ([place]) I69.193 (ICD-10-CM) - Ataxia due to  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   THERAPY DIAG:  No diagnosis found.  PERTINENT HISTORY: DIAGNOSTIC FINDINGS: IMPRESSION: 1. Decreased, improved ventricle size since 10/06/2020 with stable small volume of IVH. 2. Hematoma and edema along the right superior approach EVD tract is stable, including a small volume of bilateral SAH. 3. Expected evolution of the left midbrain/thalamic hemorrhage and edema. 4. Small additional scattered subacute infarcts remain largely occult by CT. 5. No new intracranial abnormality identified.     IMPRESSION: 1. Similar appearance of intraventricular hemorrhage as well as hemorrhage along prior EVD tract in the right frontal lobe and scattered cortical subarachnoid hemorrhage. 2. Ventricular  size remains stable. 3. No significant change in size of intraparenchymal hemorrhage centered within the left side of the midbrain extending into the thalamic pulvinar and superior cerebellar peduncle. 4. A few small foci of restricted diffusion within the left frontal and parietal lobes, corresponding to small acute/subacute infarcts  PRECAUTIONS: fall  SUBJECTIVE: No new c/o. Pt accompanied by CG Kelly   Objective: See treatment notes  Sit<>stand CGA for safety (very fast/impulsive)  Stand pivot t/f min/CGA  Scooting/squat pivot t/f CGA   TODAY'S TREATMENT:  02/08/21  Sit to stand no HHA 10x from mat with SBA and OH reach Gait training: 2 x 115 feet with RW, cues for standing up tall, keeping in middle of walker, tendency to veer L noted due to L eye patch, postural correction emphasized Standing in walker, step taps to 4" block, 10x alternating pattern, 2 sets Latissimus press 15x Seated FAQs 3# alt 15x w/lat press Seated marching 3# alt 15x w/lat press Seated heel slides using towel 15x per side Seated core exercises of hip and shoulder tosses, chops and Vs with 3.3# ball, 10x ea.  Seated OH lift with inspiration using 3.3# ball, 10x  Seated forward flexion with HHA from PT 10x with B twisting added in combination with FF, 10x to ea. side   02/05/21: Static stand on airex 30s hold no UE assist x2, EC x1 w/o UE support Sit to stand no HHA 10x from mat with SBA Standing deadlifts: 20lbs 10x, cues for slow lifting of weight to avoid retropulsion at end of deadlift when he stands up Gait training: 2 x 115 feet with RW, cues for standing up tall, keeping in middle of walker, tendency to veer L noted due to L eye patch Standing in walker, step taps to 4" block, 10x alternating pattern, 2 sets    02/02/21: Q-ped with elbow on bench and knees on mat table: alternating hip extensions: 10x R and L Tall kneeling: head/shoulder turns: horizontal: 10x, vertical: 10x Sit to stand no HHA:  0lbs 5x, 20lbs 5x, waiting for 5 sec and balancing before sitting down Standing deadlifts: 20lbs 2 x 5, waiting 5 sec before sitting down, cues for slow lifting of weight to avoid retropulsion at end of deadlift when he stands up Gait training: 1 x 405 feet with RW, cues for standing up tall, keeping in middle of walker to prevent R foot from hitting R back leg, smaller steps with L Leg, pt required min A due to occasional retropulsion, pt also had bil knees buckled and needed A due to fatigue. Standing on Wobble board: AP tilts: 1' with 1 HHA, 1' without HHA (min A, with cues to have kneesl locked and slight anterior lean to prevent retropulusion)  01/30/21: Gait training: 1 x 230 feet with RW, one sitting break, cues for looking down at his feet, smaller steps, keeping  wider BOS Min A required due to occaisonal LOB Sit to stand: 15lbs 2 x 5 with 5 sec hold for balance Standing with normal BOS: passing 6lb ball around body: 5x cw, 5x ccw; standing with narrow BOS: 5x cw, 5x ccw with 6lb ball around body Walking in parallel bars with R UE use: 10 x 10 feet fwd and bwd, used center separator board for last 5 reps to prevent legs crossing over. Cues for taking smaller steps, min A required.  01/24/21 Standing normal BOS: EO: 30 sec Standing normal BOS: EC: 1 min Standing normal BOS: EO, horizontal head turns: 20x Standing deadlifts with 15lb KB from floor: 10x, pt demo decreased WB to L LE Standing weight shifts: lateral and anterior/posterior: 20x Sit to stand: 15lb KB in hands: from standard chair, tactile and verbal cues for "nose over toes" to prevent retropulsion. Standing DOT taps: 10x L only with 2lbs at ankle to improve coordination, 10x alternating with R and L, required min to mod A for balance without HHA Standing normal BOS: bil shoulder flexion holding ball in hand: 10x; big circles: 10x cw, 10x ccw, cues to improve lateral reaching to improve trunk rotation when performing circles. Fwd  step ups 6" box 10x R and L with bil UE support    01/18/21 Sit<>stand x10  Seated heel slide with washrag x10  Standing heel slide forward with washrag 3x10 with targets  Standing heel slide to the side with washrag 3x10 with targets  Standing step forward and back on to targets 3x10  Standing balance with alternating UE raises 2x10  In // bars: forward stepping, pt cued to step on targets and "slide" foot to keep from L LE marching x 6 trips  Amb around gym 2x115' with tactile cues to keep from excessive L hip flexion, v/cs to "slide" L foot forward and slow down steps    01/16/21  Latissimus press 15x  Seated FAQs 2# alt 15x w/lat press  Seated marching 2# alt 15x w/lat press  Seated heel slides using towel 15x per side  Seated core exercises of hip and shoulder tosses, chops and Vs with 2.2# ball, 10x ea.   Seated OH lift with inspiration using 2.2# ball, 10x   Seated forward flexion with HHA from PT 10x  5xSTS with CGA diminishing to SBA  Lateral reaches over red physioball 10x ea.   01/08/21  Supine bridge, alternating marching, hip fallouts, alternating UE F/E all 2x15  Seated LAQs, marching, 2x15 performed with lat press    01/03/21 BERG score 11/56, 5x STS 35.9s with CGA Balance training in //bars, fwd and sidestep 10x per LE, marching alt pattern and heel/toe raises Stepping tasks fwd using 4" block, 10x per LE with fwd WS in // bars Latissimus press with deep inspiration 10 reps  Tapping floor targets, single target in center 10x alt in // bars 2 targets lateral of center, 10x alt in //bars    PATIENT EDUCATION: Education details: sit to stand instructions for safety Person educated: Patient and Spouse Education method: Explanation, Demonstration, and Verbal cues Education comprehension: verbalized understanding     HOME EXERCISE PROGRAM: Step/tapping on targets at home  ASSESSMENT:   CLINICAL IMPRESSION: Todays session focused on seated balance and core  strengthening followed by ambulation and transfer training.  Increased weight and resistance as noted on LE core tasks and seated core tasks.  Added 2# to LLE during gait to control ataxia which did improve coordination.  REHAB POTENTIAL: Good  CLINICAL DECISION MAKING: Stable/uncomplicated GOALS: Goals reviewed with patient? Yes   SHORT TERM GOALS:   STG Name Target Date Goal status  1 Patient will be able to perform chair to chair transfer with CGA/SBA with use of RW for safety to improve independence. Baseline: min to mod A (12/26/20) 01/23/2021 02/05/21 Able to transfer STS with SBA  2 Pt will be able to ambulate 115' with RW with CGA to min A with RW for safety to improve independence Baseline: min to mod A with RW 145' 01/23/2021 02/05/21 Ambulation of 158fx2 with RW and CGA/minA   3 Pt will be able to maintain standing balance for 30 sec to improve static balance Baseline: 10 sec before needing help due to LOB (12/26/20) 01/23/2021 02/05/21 able to stand wit S for 30s, goal met    LONG TERM GOALS:   LTG Name Target Date Goal status  1 Pt will demo >5 points improvement on Berg Balance Scale to improve static balance Baseline: 11/56 01/03/21 02/20/2021 INITIAL  2 Pt will be able to ambulate >500 feet with RW and SBA to improve walking endurance and safety. Baseline: 145' with RW min to mod A 02/20/2021 INITIAL  3 Pt will be able to perform gait speed improvement by 0.163m to improve community ambulation. Baseline:TBD 02/20/2021 INITIAL  4 Pt will demo 5 sec improvement in 5x sit to stand (with or without UE support) to improve functional strength Baseline: TBD 02/20/2021 INITIAL    PLAN: PT FREQUENCY: 2x/week   PT DURATION: 8 weeks   PLANNED INTERVENTIONS: Therapeutic exercises, Therapeutic activity, Neuro Muscular re-education, Balance training, Gait training, Patient/Family education, Joint mobilization, Stair training, Visual/preceptual remediation/compensation, Wheelchair mobility  training, Cryotherapy, Moist heat, and Manual therapy   PLAN FOR NEXT SESSION: Standing tasks in // bars, floor targets and stepping tasks, Scifit?     Patient will benefit from skilled therapeutic intervention in order to improve the following deficits and impairments:     Visit Diagnosis: No diagnosis found.     Problem List Patient Active Problem List   Diagnosis Date Noted   Thrombosis    Sleep disturbance    Dysphagia, post-stroke    PAF (paroxysmal atrial fibrillation) ([facility]   Acute pulmonary embolism without acute cor pulmonale (HCC)    Malnutrition of moderate degree 11/04/2020   Tracheostomy care ([facility]   Pressure injury of skin 10/23/2020   Intracranial hemorrhage (HCC)    Atrial fibrillation ([facility]   Prediabetes    Leukocytosis    Acute blood loss anemia    Hypernatremia    ICH (intracerebral hemorrhage) ([facility]03/28/2022   Unilateral primary osteoarthritis, left knee 09/26/2016   Atrial flutter ([facility]03/28/2015   Near syncope 09/25/2013     JeLanice ShirtsT 02/08/2021, 2:46 PM  [place]u[place]onsrMc[place]hone: [phone] Fax:  [phone]: StLEVANDER KATZENSTEINRN: [id number] of Birth: 08/1955/03/14

## 2021-02-11 ENCOUNTER — Other Ambulatory Visit: Payer: Self-pay | Admitting: Internal Medicine

## 2021-02-12 ENCOUNTER — Ambulatory Visit: Payer: Medicare Other

## 2021-02-12 ENCOUNTER — Ambulatory Visit: Payer: Medicare Other | Admitting: Occupational Therapy

## 2021-02-12 ENCOUNTER — Other Ambulatory Visit: Payer: Self-pay

## 2021-02-12 ENCOUNTER — Ambulatory Visit: Payer: Medicare Other | Admitting: Physical Therapy

## 2021-02-12 DIAGNOSIS — R2681 Unsteadiness on feet: Secondary | ICD-10-CM

## 2021-02-12 DIAGNOSIS — R41842 Visuospatial deficit: Secondary | ICD-10-CM

## 2021-02-12 DIAGNOSIS — M6281 Muscle weakness (generalized): Secondary | ICD-10-CM

## 2021-02-12 DIAGNOSIS — R260 Ataxic gait: Secondary | ICD-10-CM

## 2021-02-12 DIAGNOSIS — R208 Other disturbances of skin sensation: Secondary | ICD-10-CM

## 2021-02-12 DIAGNOSIS — R270 Ataxia, unspecified: Secondary | ICD-10-CM

## 2021-02-12 DIAGNOSIS — R41841 Cognitive communication deficit: Secondary | ICD-10-CM

## 2021-02-12 DIAGNOSIS — R41844 Frontal lobe and executive function deficit: Secondary | ICD-10-CM

## 2021-02-12 DIAGNOSIS — R278 Other lack of coordination: Secondary | ICD-10-CM

## 2021-02-12 DIAGNOSIS — R471 Dysarthria and anarthria: Secondary | ICD-10-CM

## 2021-02-12 DIAGNOSIS — I69218 Other symptoms and signs involving cognitive functions following other nontraumatic intracranial hemorrhage: Secondary | ICD-10-CM

## 2021-02-12 NOTE — Therapy (Signed)
[facility] [address] [place], [place], [address] Phone: [phone]   Fax:  [phone]  Occupational Therapy Treatment  Patient Details  Name: Walter [name] MRN: [id number] Date of Birth: 04-01-1956 Referring Provider (OT): Dr. Alysia Penna   Encounter Date: 02/12/2021   OT End of Session - 02/12/21 1323     Visit Number 11    Number of Visits 2    Date for OT Re-Evaluation 04/27/21    Authorization Type Medicare Part A & B    Authorization Time Period 02/02/21-updated plan for 3 x week x 12 weeks, anticipate d/c after 8 weeks    Authorization - Visit Number 11    Authorization - Number of Visits 20    Progress Note Due on Visit 10    OT Start Time 1320    OT Stop Time 1400    OT Time Calculation (min) 40 min    Activity Tolerance Patient tolerated treatment well    Behavior During Therapy Impulsive             Past Medical History:  Diagnosis Date   Atrial flutter ([place])    s/p ablation   DVT (deep venous thrombosis) ([place])    Near syncope 09/25/2013   Paroxysmal atrial fibrillation ([place])    Stroke ([place])    initially hemorragic (not on [place]) followed by subsequent embolic stroke    Past Surgical History:  Procedure Laterality Date   A-FLUTTER ABLATION N/A 03/09/2020   Procedure: A-FLUTTER ABLATION;  Surgeon: Thompson Grayer, MD;  Location: [facility];  Service: Cardiovascular;  Laterality: N/A;   CARDIOVERSION N/A 09/27/2013   Procedure: CARDIOVERSION;  Surgeon: Lelon Perla, MD;  Location: [facility] ENDOSCOPY;  Service: Cardiovascular;  Laterality: N/A;   IR ANGIO EXTERNAL CAROTID SEL EXT CAROTID UNI R MOD SED  09/26/2020   IR ANGIO INTRA EXTRACRAN SEL COM CAROTID INNOMINATE BILAT MOD SED  09/26/2020   IR ANGIO VERTEBRAL SEL VERTEBRAL UNI R MOD SED  09/26/2020   IR IVC FILTER PLMT / S&I /IMG GUID/MOD SED  10/08/2020   IR IVUS EACH ADDITIONAL NON CORONARY VESSEL  11/02/2020   IR PTA VENOUS EXCEPT  DIALYSIS CIRCUIT  11/02/2020   IR RADIOLOGIST EVAL & MGMT  01/17/2021   IR THROMBECT VENO MECH MOD SED  10/27/2020   IR THROMBECT VENO MECH MOD SED  11/02/2020   IR US GUIDE VASC ACCESS LEFT  10/27/2020   IR US GUIDE VASC ACCESS RIGHT  09/26/2020   IR US GUIDE VASC ACCESS RIGHT  11/02/2020   IR VENO/EXT/UNI RIGHT  11/02/2020   IR VENOCAVAGRAM IVC  10/27/2020   RETINAL DETACHMENT SURGERY     right knee arthroscopy     TEE WITHOUT CARDIOVERSION N/A 09/27/2013   Procedure: TRANSESOPHAGEAL ECHOCARDIOGRAM (TEE);  Surgeon: Lelon Perla, MD;  Location: [facility] ENDOSCOPY;  Service: Cardiovascular;  Laterality: N/A;   WRIST SURGERY      There were no vitals filed for this visit.   Subjective Assessment - 02/12/21 1322     Subjective  has appt with neuroophthalmologist first of September    Pertinent History ICH of brainstem (complicated by hydrocephalus, DVTs, and tracheostomy).  PMH:  a-fib    Limitations fall risk, ataxia, impulsivity    Patient Stated Goals use L side of body better (decrease ataxia), stay active and heal brain    Currently in Pain? No/denies    Pain Onset More than a month ago  Matching clock faces with digital times with LUE with min-mod difficulty with coordination and incr time for visual scanning/attention, but no cueing needed.  Standing at counter (with gait belt) with lateral functional reaching to each side  to flip cards for wt. Shift and standing balance and LUE control.  Pt needed CGA with reaching to R side/with RUE and min A when reaching with LUE/to L side for upright posture and posterior lean.  Pt with mod difficulty with control LUE. (Rest break between sides)  Sitting, placing various sized pegs in arc pegboard with LUE with min-mod difficulty, then removing with min difficulty and cueing/emphasis on slow controlled release vs. Dropping pegs.  Educated/cautioned pt/caregiver against use of resistive bands/weights with LUE due to decr control and  risk of injury.  Recommended continued focus on controlled functional use and guided/closed-chain exercises or bilateral tasks (eating sandwich with BUEs or drinking with BUEs).  Pt/caregiver verbalized understanding.             OT Short Term Goals - 02/12/21 1357       OT SHORT TERM GOAL #1   Title Pt/caregiver will be independent with initial HEP for LUE coordination and R hand strength.--    Time 4    Period Weeks    Status Achieved   needs continued reinforcement, pt is not using consistently   Target Date 03/02/21      OT SHORT TERM GOAL #2   Title Pt/caregiver will be independent with vision HEP and visual compensation strategies.    Time 4    Period Weeks    Status On-going   Pt/ family verbalize understanding of vision HEP, needs continued reinforcement of compensatory strategeis- pt is not performing head turns     OT SHORT TERM GOAL #3   Title Pt will improve LUE coordination for ADLs as shown by improving score on box and blocks test by at least 6.    Baseline L-21 blocks    Time 4    Period Weeks    Status On-going   25 blocks     OT SHORT TERM GOAL #4   Title Pt will perform simple snack prep/home maintenance task from w/c level mod I.    Time 4    Period Weeks    Status Achieved   met per pt's wife     OT SHORT TERM GOAL #5   Title Pt will verbalize understanding of compensation strategies for sensory deficts for incr safety.    Time 4    Period Weeks    Status On-going   Pt verbalized that he has to be careful about temperature, needs additional reinforcement     OT SHORT TERM GOAL #6   Title Pt will improve R hand grip stength to at least 32lbs to assist with ADLs/opening containers.    Time 4    Period Weeks    Status Achieved   33.5     OT SHORT TERM GOAL #7   Title --               OT Long Term Goals - 02/02/21 1416       OT LONG TERM GOAL #1   Title Pt will be independent with updated HEP--    Time 12    Period Weeks    Status  On-going    Target Date 04/27/21      OT LONG TERM GOAL #2   Title Pt will improve L hand coordination for ADLs  as shown by completing 9-hole peg test in less than 70sec.    Baseline 107sec    Time 12    Period Weeks    Status On-going      OT LONG TERM GOAL #3   Title Pt will be mod I with toileting.    Time 12    Period Weeks    Status On-going      OT LONG TERM GOAL #4   Title Pt will perform simple snack prep/home maintenance tasks in standing with supervision.    Time 12    Period Weeks    Status On-going      OT LONG TERM GOAL #5   Title Pt will demo good safety awareness/compensation for sensory deficits and attention to LUE during functional tasks and transfers without cueing.    Time 12    Period Weeks    Status On-going      OT LONG TERM GOAL #6   Title Pt will perform simple environmental scanning/navigation with at least 90% accuracy for incr safety.    Time 12    Period Weeks    Status On-going      OT LONG TERM GOAL #7   Title Pt will perform all basic ADLS with no more than supervision.    Time 12    Period Weeks    Status New                   Plan - 02/12/21 1324     Clinical Impression Statement Pt progressing slowly towards goals with improving LUE control and balance.    OT Occupational Profile and History Detailed Assessment- Review of Records and additional review of physical, cognitive, psychosocial history related to current functional performance    Occupational performance deficits (Please refer to evaluation for details): ADL's;IADL's;Work;Leisure;Social Participation    Body Structure / Function / Physical Skills ADL;Strength;Balance;Proprioception;UE functional use;IADL;Endurance;Vision;Mobility;Coordination;Decreased knowledge of precautions;FMC;GMC;Sensation    Cognitive Skills Attention;Safety Awareness;Memory;Perception;Problem Solve    Rehab Potential Good    Clinical Decision Making Several treatment options, min-mod task  modification necessary    Comorbidities Affecting Occupational Performance: May have comorbidities impacting occupational performance    Modification or Assistance to Complete Evaluation  Min-Moderate modification of tasks or assist with assess necessary to complete eval    OT Frequency 3x / week   updated frequency may decrease after 4 weeks   OT Duration 12 weeks   written for 12 weeks however may d/c after 8 depenent upon progress.   OT Treatment/Interventions Self-care/ADL training;Moist Heat;Fluidtherapy;DME and/or AE instruction;Balance training;Therapeutic activities;Aquatic Therapy;Ultrasound;Therapeutic exercise;Cognitive remediation/compensation;Visual/perceptual remediation/compensation;Functional Mobility Training;Neuromuscular education;Cryotherapy;Energy conservation;Manual Therapy;Patient/family education    Plan strategies for ataxia, functional reaching, standing balance    Consulted and Agree with Plan of Care Family member/caregiver;Patient    Family Member Consulted wife             Patient will benefit from skilled therapeutic intervention in order to improve the following deficits and impairments:   Body Structure / Function / Physical Skills: ADL, Strength, Balance, Proprioception, UE functional use, IADL, Endurance, Vision, Mobility, Coordination, Decreased knowledge of precautions, FMC, GMC, Sensation Cognitive Skills: Attention, Safety Awareness, Memory, Perception, Problem Solve     Visit Diagnosis: Ataxia  Other lack of coordination  Other disturbances of skin sensation  Muscle weakness (generalized)  Visuospatial deficit  Frontal lobe and executive function deficit  Other symptoms and signs involving cognitive functions following other nontraumatic intracranial hemorrhage    Problem List Patient Active  Problem List   Diagnosis Date Noted   Thrombosis    Sleep disturbance    Dysphagia, post-stroke    PAF (paroxysmal atrial fibrillation) (HCC)     Acute pulmonary embolism without acute cor pulmonale (HCC)    Malnutrition of moderate degree 11/04/2020   Tracheostomy care ([place])    Pressure injury of skin 10/23/2020   Intracranial hemorrhage (H[facility])    Atrial fibrillation ([place])    Prediabetes    Leukocytosis    Acute blood loss anemia    Hypernatremia    ICH (intracerebral hemorrhage) ([place]) 09/25/2020   Unilateral primary osteoarthritis, left knee 09/26/2016   Atrial flutter ([place]) 09/25/2013   Near syncope 09/25/2013    [facility] 02/12/2021, 2:13 PM  [facility] [address] [place], [place], [address] Phone: [phone]   Fax:  [phone]  Name: Walter [name] MRN: [id number] Date of Birth: [birth year]  Vianne Bulls, OTR/L [facility] [address] [name], [place]  [address] [phone] phone [phone] 02/12/21 2:13 PM

## 2021-02-12 NOTE — Patient Instructions (Addendum)
Continue to practice your belly breathing. Breathe in through your nose, breathe out through mouth to fill up your belly.   In conversation:  -Fill up your belly before you talk -Keep your sentences short  -Be energized when you speak

## 2021-02-12 NOTE — Therapy (Signed)
[facility] [address] [place], [place], [address] Phone: [phone]   Fax:  [phone]  Speech Language Pathology Treatment  Patient Details  Name: Walter [name] MRN: [id number] Date of Birth: 01/20/1956 Referring Provider (SLP): Charlett Blake, MD   Encounter Date: 02/12/2021   End of Session - 02/12/21 1337     Visit Number 13    Number of Visits 17    Date for SLP Re-Evaluation 02/21/21    Authorization Type Medicare    SLP Start Time 1146    SLP Stop Time  1230    SLP Time Calculation (min) 44 min    Activity Tolerance Patient tolerated treatment well             Past Medical History:  Diagnosis Date   Atrial flutter ([place])    s/p ablation   DVT (deep venous thrombosis) ([place])    Near syncope 09/25/2013   Paroxysmal atrial fibrillation ([place])    Stroke [facility])    initially hemorragic (not on [facility]) followed by subsequent embolic stroke    Past Surgical History:  Procedure Laterality Date   A-FLUTTER ABLATION N/A 03/09/2020   Procedure: A-FLUTTER ABLATION;  Surgeon: Thompson Grayer, MD;  Location: [facility];  Service: Cardiovascular;  Laterality: N/A;   CARDIOVERSION N/A 09/27/2013   Procedure: CARDIOVERSION;  Surgeon: Lelon Perla, MD;  Location: [facility] ENDOSCOPY;  Service: Cardiovascular;  Laterality: N/A;   IR ANGIO EXTERNAL CAROTID SEL EXT CAROTID UNI R MOD SED  09/26/2020   IR ANGIO INTRA EXTRACRAN SEL COM CAROTID INNOMINATE BILAT MOD SED  09/26/2020   IR ANGIO VERTEBRAL SEL VERTEBRAL UNI R MOD SED  09/26/2020   IR IVC FILTER PLMT / S&I /IMG GUID/MOD SED  10/08/2020   IR IVUS EACH ADDITIONAL NON CORONARY VESSEL  11/02/2020   IR PTA VENOUS EXCEPT DIALYSIS CIRCUIT  11/02/2020   IR RADIOLOGIST EVAL & MGMT  01/17/2021   IR THROMBECT VENO MECH MOD SED  10/27/2020   IR THROMBECT VENO MECH MOD SED  11/02/2020   IR US GUIDE VASC ACCESS LEFT  10/27/2020   IR US GUIDE VASC ACCESS RIGHT  09/26/2020   IR US  GUIDE VASC ACCESS RIGHT  11/02/2020   IR VENO/EXT/UNI RIGHT  11/02/2020   IR VENOCAVAGRAM IVC  10/27/2020   RETINAL DETACHMENT SURGERY     right knee arthroscopy     TEE WITHOUT CARDIOVERSION N/A 09/27/2013   Procedure: TRANSESOPHAGEAL ECHOCARDIOGRAM (TEE);  Surgeon: Lelon Perla, MD;  Location: [facility] ENDOSCOPY;  Service: Cardiovascular;  Laterality: N/A;   WRIST SURGERY      There were no vitals filed for this visit.   Subjective Assessment - 02/12/21 1149     Subjective "I was made to say my sentences over the weekend"    Currently in Pain? No/denies                   ADULT SLP TREATMENT - 02/12/21 1148       General Information   Behavior/Cognition Alert;Cooperative;Pleasant mood;Requires cueing      Treatment Provided   Treatment provided Cognitive-Linquistic      Cognitive-Linquistic Treatment   Treatment focused on Cognition;Dysarthria;Patient/family/caregiver education    Skilled Treatment SLP reviewed abdominal breathing via extended inhale versus sniff-sniff-blow. Pt reported preference for longer inhale. Intermittent verbal and visual cues required to inhale via nose versus mouth for deeper breath. Increased accuracy of abdominal breathing exhibited at rest versus for speech related  tasks. SLP trialed simplification of process during speech tasks by saying "fill up your belly" or pointing to nose, which was intermittently effective. Pt endorsed some difficulty with reading due to double vision, in which SLP had pt complete sentence given one word starter. Visual cues required to reduce impulsivity and fast rate impacting carryover of strategies. After each sentence generated, SLP provided additional question or statement to target spontaneous connected speech. Usual fading to occasional cues required to maintain "energized" voice for open-ended responses. Pt continues to c/o phlegm impacting his voice and inquired if foods impact mucous production. SLP provided examples of  food types (dairy products and acidic foods) to possibly avoid. Pt verbalized understanding.      Assessment / Recommendations / Plan   Plan Continue with current plan of care      Progression Toward Goals   Progression toward goals Progressing toward goals              SLP Education - 02/12/21 1337     Education Details dysarthria HEP, compensations, avoiding dairy/acidic foods    Person(s) Educated Patient    Methods Explanation;Demonstration;Verbal cues    Comprehension Verbalized understanding;Returned demonstration;Need further instruction;Verbal cues required              SLP Short Term Goals - 02/12/21 1340       SLP SHORT TERM GOAL #1   Title Pt will complete dysarthria HEP with occasional min A over 2 sessions    Status Not Met      SLP SHORT TERM GOAL #2   Title Pt will produce loud /a/ or "hey!" with at least high 80s dB average over 2 sessions    Status Not Met      SLP SHORT TERM GOAL #3   Title Pt will generate abdominal breathing in 16/20 answers to SLP questions over 2 sessions    Baseline 01-18-21    Status Partially Met      SLP SHORT TERM GOAL #4   Title Pt will demonstrate dysarthria compensations in 10 minute simple to mod complex conversation to be 75% intelligibile with occasional min A over 2 sessions    Status Not Met      SLP SHORT TERM GOAL #5   Title Pt will complete formal cognitive linguistic assessment in first 1-2 ST sessions    Status Achieved      SLP SHORT TERM GOAL #6   Title Pt will use memory and attention compensations for appointments, medicine management, and other daily activities with occasional min A over 2 sessions    Status Not Met              SLP Long Term Goals - 02/12/21 1340       SLP LONG TERM GOAL #1   Title Pt will complete dysarthria HEP with rare min A over 2 sessions    Time 2    Period Weeks    Status On-going      SLP LONG TERM GOAL #2   Title Pt will demonstrate dysarthria compensations  in 15+ minute simple to mod complex conversation to be 90% intelligibile with rare min A over 2 sessions    Time 2    Period Weeks    Status On-going      SLP LONG TERM GOAL #3   Title Pt will demo improved sustained and selective attention during therapeutic tasks with less than 3 cues for active listening during 2 sessions    Time 2  Period Weeks    Status On-going      SLP LONG TERM GOAL #4   Title Pt will demonstrate emergent and anticipatory awareness of impulsivity with less than 5 cues to modify behaviors during 2 sessions    Time 2    Period Weeks    Status On-going      SLP LONG TERM GOAL #5   Title Pt will report reduced frustration and improved communication effectiveness via PROM by 2 points at last ST sessions    Baseline CES: 15.5 & V-RQOL:35    Time 2    Period Weeks    Status On-going              Plan - 02/12/21 1338     Clinical Impression Statement "[name]" presents for OPST intervention secondary to stroke in March 2022. SLP continued education and training pt in breath support and compensations for audible, intelligible speech. Intermittent cues required to use compensations and sustain hand placement on abdomen for abdominal breathing. Pt presents wtih improving accuracy on structured speech tasks with slight improvement in carryover at conversational level noted this session. Skilled ST is cont'd warranted to address dysarthria and cognition to maximize communication effectiveness and optimize functional independence and safety at home.    Speech Therapy Frequency 3x / week    Duration 8 weeks    Treatment/Interventions Cueing hierarchy;Functional tasks;Patient/family education;Cognitive reorganization;Multimodal communcation approach;Language facilitation;Compensatory techniques;Internal/external aids;SLP instruction and feedback    Potential to Achieve Goals Fair    Potential Considerations Ability to learn/carryover  information;Cooperation/participation level    SLP Home Exercise Plan provided    Consulted and Agree with Plan of Care Patient             Patient will benefit from skilled therapeutic intervention in order to improve the following deficits and impairments:   Dysarthria and anarthria  Cognitive communication deficit    Problem List Patient Active Problem List   Diagnosis Date Noted   Thrombosis    Sleep disturbance    Dysphagia, post-stroke    PAF (paroxysmal atrial fibrillation) ([place])    Acute pulmonary embolism without acute cor pulmonale (HCC)    Malnutrition of moderate degree 11/04/2020   Tracheostomy care ([place])    Pressure injury of skin 10/23/2020   Intracranial hemorrhage (HCC)    Atrial fibrillation ([place])    Prediabetes    Leukocytosis    Acute blood loss anemia    Hypernatremia    ICH (intracerebral hemorrhage) ([place]) 09/25/2020   Unilateral primary osteoarthritis, left knee 09/26/2016   Atrial flutter ([place]) 09/25/2013   Near syncope 09/25/2013    Alinda Deem, [name] 02/12/2021, 1:40 PM  [facility] [address] [place], [place], [address] Phone: [phone]   Fax:  [phone]   Name: DERON POOLE MRN: [id number] Date of Birth: Jan 23, 1956

## 2021-02-12 NOTE — Telephone Encounter (Signed)
Prescription refill request for Eliquis received. Indication: fib Last office visit: 12/27/20 ([facility])  Scr: 0.80 (11/20/20) Age: 65 Weight: 81.4kg  Appropriate dose and refill sent to requested pharmacy.

## 2021-02-12 NOTE — Therapy (Signed)
OUTPATIENT PHYSICAL THERAPY TREATMENT NOTE  Patient Name: Walter [name] MRN: [id number] DOB:01/04/1956, 65 y.o., male Today's Date: 02/12/2021  PCP: Alroy Dust, L.Marlou Sa, MD REFERRING PROVIDER: Alroy Dust, Carlean Jews.Marlou Sa, MD   PT End of Session - 02/12/21 1234     Visit Number 12    Number of Visits 17    Date for PT Re-Evaluation 02/20/21    Authorization Type Medicare 12/26/20 to 02/20/21    Progress Note Due on Visit 20    PT Start Time 1231    PT Stop Time 1315    PT Time Calculation (min) 44 min    Equipment Utilized During Treatment Gait belt    Activity Tolerance Patient tolerated treatment well               Past Medical History:  Diagnosis Date   Atrial flutter ([place])    s/p ablation   DVT (deep venous thrombosis) ([place])    Near syncope 09/25/2013   Paroxysmal atrial fibrillation ([place])    Stroke [facility])    initially hemorragic (not on [place]) followed by subsequent embolic stroke   Past Surgical History:  Procedure Laterality Date   A-FLUTTER ABLATION N/A 03/09/2020   Procedure: A-FLUTTER ABLATION;  Surgeon: Thompson Grayer, MD;  Location: [place] CV LAB;  Service: Cardiovascular;  Laterality: N/A;   CARDIOVERSION N/A 09/27/2013   Procedure: CARDIOVERSION;  Surgeon: Lelon Perla, MD;  Location: [facility] ENDOSCOPY;  Service: Cardiovascular;  Laterality: N/A;   IR ANGIO EXTERNAL CAROTID SEL EXT CAROTID UNI R MOD SED  09/26/2020   IR ANGIO INTRA EXTRACRAN SEL COM CAROTID INNOMINATE BILAT MOD SED  09/26/2020   IR ANGIO VERTEBRAL SEL VERTEBRAL UNI R MOD SED  09/26/2020   IR IVC FILTER PLMT / S&I /IMG GUID/MOD SED  10/08/2020   IR IVUS EACH ADDITIONAL NON CORONARY VESSEL  11/02/2020   IR PTA VENOUS EXCEPT DIALYSIS CIRCUIT  11/02/2020   IR RADIOLOGIST EVAL & MGMT  01/17/2021   IR THROMBECT VENO MECH MOD SED  10/27/2020   IR THROMBECT VENO MECH MOD SED  11/02/2020   IR US GUIDE VASC ACCESS LEFT  10/27/2020   IR US GUIDE VASC ACCESS RIGHT  09/26/2020   IR US GUIDE VASC ACCESS RIGHT  11/02/2020    IR VENO/EXT/UNI RIGHT  11/02/2020   IR VENOCAVAGRAM IVC  10/27/2020   RETINAL DETACHMENT SURGERY     right knee arthroscopy     TEE WITHOUT CARDIOVERSION N/A 09/27/2013   Procedure: TRANSESOPHAGEAL ECHOCARDIOGRAM (TEE);  Surgeon: Lelon Perla, MD;  Location: [facility] ENDOSCOPY;  Service: Cardiovascular;  Laterality: N/A;   WRIST SURGERY     Patient Active Problem List   Diagnosis Date Noted   Thrombosis    Sleep disturbance    Dysphagia, post-stroke    PAF (paroxysmal atrial fibrillation) ([place])    Acute pulmonary embolism without acute cor pulmonale (HCC)    Malnutrition of moderate degree 11/04/2020   Tracheostomy care ([place])    Pressure injury of skin 10/23/2020   Intracranial hemorrhage (HCC)    Atrial fibrillation (HCC)    Prediabetes    Leukocytosis    Acute blood loss anemia    Hypernatremia    ICH (intracerebral hemorrhage) ([place]) 09/25/2020   Unilateral primary osteoarthritis, left knee 09/26/2016   Atrial flutter ([place]) 09/25/2013   Near syncope 09/25/2013    REFERRING DIAG: I61.3 (ICD-10-CM) - 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 ([place]) I69.193 (ICD-10-CM) - 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  Diplopia "hurt the enjoyment of it, but it was a good night."  Is still walking at home with caregiver on days he does not have PT.  Objective:   TODAY'S TREATMENT:  02/12/2021:  Transfers: sit > stand from transport wheelchair with RW and cues to bring chest forward in midline for controlled, equal WB during sit > stand.  Pt consistently performed sit <> stand with min A.  Stand pivot to mat with RW and min A and max verbal cues to  sequence pivot.  Pt required cues intermittently to keep close to RW when pivoting for safety.  NMR with physioball in sitting: LUE on ball on L side performed lateral weight shifts to L pushing ball and elongating trunk on L side x 10 reps, therapist provided cues for head righting back to midline.  Changed and put ball in front of pt.: placed bilat UE on ball and performed leaning forwards pushing ball out in midline keeping head upright and trunk upright to weight shift over BOS x 5 reps, changed to weight shifting forwards and coming to a squat in midline x 5 reps.  Progressed to 5 reps coming to a squat and lifting RUE off ball for increased weight shift to L x 5 second hold.  Gait: Side stepping with RW to L and R x 15' with min A and verbal cues to sequence; discussed how pt could use side stepping to negotiate through narrow spaces/doorways instead of trying to fit through with RW going forwards.  Performed walking in hallway with LUE HHA and RUE reaching up and to the R - cues to keep RUE high on wall to facilitate increased weight shift to R and proprioceptive feedback to decrease pt veering too far to L when ambulating.  Pt required mod A to perform x 4 reps.  02/08/21  Sit to stand no HHA 10x from mat with SBA and OH reach Gait training: 2 x 115 feet with RW, cues for standing up tall, keeping in middle of walker, tendency to veer L noted due to L eye patch, postural correction emphasized Standing in walker, step taps to 4" block, 10x alternating pattern, 2 sets Latissimus press 15x Seated FAQs 3# alt 15x w/lat press Seated marching 3# alt 15x w/lat press Seated heel slides using towel 15x per side Seated core exercises of hip and shoulder tosses, chops and Vs with 3.3# ball, 10x ea.  Seated OH lift with inspiration using 3.3# ball, 10x  Seated forward flexion with HHA from PT 10x with B twisting added in combination with FF, 10x to ea. side     PATIENT EDUCATION: Education details:  Side stepping with RW through narrow spaces Person educated: Patient and caregiver Education method: Explanation, Demonstration, and Verbal cues Education comprehension: verbalized understanding     HOME EXERCISE PROGRAM: No changes  ASSESSMENT:   CLINICAL IMPRESSION: Pt is making good progress with transfers and requires decreased assistance for safe sit <> stand.  Pt able to respond to verbal and tactile cues to improve head and trunk righting with weight shifting to L in sitting.  Pt continues to demonstrate decreased coordination with gait especially when performing side stepping, turning and when having to judge distance  to obstacles.  Will benefit from continued PT to address these ongoing impairments.  REHAB POTENTIAL: Good   CLINICAL DECISION MAKING: Stable/uncomplicated  GOALS: Goals reviewed with patient? Yes   SHORT TERM GOALS:   STG Name Target Date Goal status  1 Patient will be able to perform chair to chair transfer with CGA/SBA with use of RW for safety to improve independence. Baseline: min to mod A (12/26/20) 01/23/2021 02/05/21 Able to transfer STS with SBA  2 Pt will be able to ambulate 115' with RW with CGA to min A with RW for safety to improve independence Baseline: min to mod A with RW 145' 01/23/2021 02/05/21 Ambulation of 175fx2 with RW and CGA/minA   3 Pt will be able to maintain standing balance for 30 sec to improve static balance Baseline: 10 sec before needing help due to LOB (12/26/20) 01/23/2021 02/05/21 able to stand wit S for 30s, goal met    LONG TERM GOALS:   LTG Name Target Date Goal status  1 Pt will demo >5 points improvement on Berg Balance Scale to improve static balance Baseline: 11/56 01/03/21 02/20/2021 INITIAL  2 Pt will be able to ambulate >500 feet with RW and SBA to improve walking endurance and safety. Baseline: 145' with RW min to mod A 02/20/2021 INITIAL  3 Pt will be able to perform gait speed improvement by 0.144m to improve community  ambulation. Baseline:TBD 02/20/2021 INITIAL  4 Pt will demo 5 sec improvement in 5x sit to stand (with or without UE support) to improve functional strength Baseline: TBD 02/20/2021 INITIAL    PLAN: PT FREQUENCY: 2x/week   PT DURATION: 8 weeks   PLANNED INTERVENTIONS: Therapeutic exercises, Therapeutic activity, Neuro Muscular re-education, Balance training, Gait training, Patient/Family education, Joint mobilization, Stair training, Visual/preceptual remediation/compensation, Wheelchair mobility training, Cryotherapy, Moist heat, and Manual therapy   PLAN FOR NEXT SESSION: Standing tasks in // bars, floor targets and stepping tasks, higher level gait training-stepping over and around obstacles, head and trunk righting    Visit Diagnosis: Unsteadiness on feet  Other lack of coordination  Ataxia  Other disturbances of skin sensation  Muscle weakness (generalized)  Ataxic gait     Problem List Patient Active Problem List   Diagnosis Date Noted   Thrombosis    Sleep disturbance    Dysphagia, post-stroke    PAF (paroxysmal atrial fibrillation) ([facility]   Acute pulmonary embolism without acute cor pulmonale ([facility]   Malnutrition of moderate degree 11/04/2020   Tracheostomy care ([facility]   Pressure injury of skin 10/23/2020   Intracranial hemorrhage (HCC)    Atrial fibrillation ([facility]   Prediabetes    Leukocytosis    Acute blood loss anemia    Hypernatremia    ICH (intracerebral hemorrhage) ([facility]03/28/2022   Unilateral primary osteoarthritis, left knee 09/26/2016   Atrial flutter ([facility]03/28/2015   Near syncope 09/25/2013     AuRico JunkerPT, DPT 02/12/21    5:20 PM    [place]uBarryrColumbia City[place]hone: [phone] Fax:  [phone]: Walter ECONOMOURN: [id number] of Birth: 3/Dec 06, 1955

## 2021-02-14 ENCOUNTER — Other Ambulatory Visit: Payer: Self-pay

## 2021-02-14 ENCOUNTER — Ambulatory Visit: Payer: Medicare Other

## 2021-02-14 ENCOUNTER — Ambulatory Visit: Payer: Medicare Other | Admitting: Occupational Therapy

## 2021-02-14 DIAGNOSIS — M6281 Muscle weakness (generalized): Secondary | ICD-10-CM

## 2021-02-14 DIAGNOSIS — R270 Ataxia, unspecified: Secondary | ICD-10-CM

## 2021-02-14 DIAGNOSIS — R208 Other disturbances of skin sensation: Secondary | ICD-10-CM

## 2021-02-14 DIAGNOSIS — R278 Other lack of coordination: Secondary | ICD-10-CM

## 2021-02-14 DIAGNOSIS — R41841 Cognitive communication deficit: Secondary | ICD-10-CM

## 2021-02-14 DIAGNOSIS — R41844 Frontal lobe and executive function deficit: Secondary | ICD-10-CM

## 2021-02-14 DIAGNOSIS — R471 Dysarthria and anarthria: Secondary | ICD-10-CM

## 2021-02-14 DIAGNOSIS — I69218 Other symptoms and signs involving cognitive functions following other nontraumatic intracranial hemorrhage: Secondary | ICD-10-CM

## 2021-02-14 DIAGNOSIS — R41842 Visuospatial deficit: Secondary | ICD-10-CM

## 2021-02-14 NOTE — Therapy (Signed)
[facility] [address] [place], [place], [address] Phone: [phone]   Fax:  [phone]  Speech Language Pathology Treatment  Patient Details  Name: Walter [name] MRN: [id number] Date of Birth: 03-06-1956 Referring Provider (SLP): Charlett Blake, MD   Encounter Date: 02/14/2021   End of Session - 02/14/21 1430     Visit Number 14    Number of Visits 17    Date for SLP Re-Evaluation 02/21/21    Authorization Type Medicare    SLP Start Time 1319    SLP Stop Time  1400    SLP Time Calculation (min) 41 min    Activity Tolerance Patient tolerated treatment well             Past Medical History:  Diagnosis Date   Atrial flutter ([place])    s/p ablation   DVT (deep venous thrombosis) ([place])    Near syncope 09/25/2013   Paroxysmal atrial fibrillation ([place])    Stroke [facility])    initially hemorragic (not on [facility]) followed by subsequent embolic stroke    Past Surgical History:  Procedure Laterality Date   A-FLUTTER ABLATION N/A 03/09/2020   Procedure: A-FLUTTER ABLATION;  Surgeon: Thompson Grayer, MD;  Location: [place] CV LAB;  Service: Cardiovascular;  Laterality: N/A;   CARDIOVERSION N/A 09/27/2013   Procedure: CARDIOVERSION;  Surgeon: Lelon Perla, MD;  Location: [facility] ENDOSCOPY;  Service: Cardiovascular;  Laterality: N/A;   IR ANGIO EXTERNAL CAROTID SEL EXT CAROTID UNI R MOD SED  09/26/2020   IR ANGIO INTRA EXTRACRAN SEL COM CAROTID INNOMINATE BILAT MOD SED  09/26/2020   IR ANGIO VERTEBRAL SEL VERTEBRAL UNI R MOD SED  09/26/2020   IR IVC FILTER PLMT / S&I /IMG GUID/MOD SED  10/08/2020   IR IVUS EACH ADDITIONAL NON CORONARY VESSEL  11/02/2020   IR PTA VENOUS EXCEPT DIALYSIS CIRCUIT  11/02/2020   IR RADIOLOGIST EVAL & MGMT  01/17/2021   IR THROMBECT VENO MECH MOD SED  10/27/2020   IR THROMBECT VENO MECH MOD SED  11/02/2020   IR US GUIDE VASC ACCESS LEFT  10/27/2020   IR US GUIDE VASC ACCESS RIGHT  09/26/2020   IR US  GUIDE VASC ACCESS RIGHT  11/02/2020   IR VENO/EXT/UNI RIGHT  11/02/2020   IR VENOCAVAGRAM IVC  10/27/2020   RETINAL DETACHMENT SURGERY     right knee arthroscopy     TEE WITHOUT CARDIOVERSION N/A 09/27/2013   Procedure: TRANSESOPHAGEAL ECHOCARDIOGRAM (TEE);  Surgeon: Lelon Perla, MD;  Location: [facility] ENDOSCOPY;  Service: Cardiovascular;  Laterality: N/A;   WRIST SURGERY      There were no vitals filed for this visit.   Subjective Assessment - 02/14/21 1428     Subjective "When we first met you said we should keep the breathing separate from the loudness." (pt is correct)    Patient is accompained by: Family member   Barnetta Chapel -dtr   Currently in Pain? No/denies                   ADULT SLP TREATMENT - 02/14/21 1330       General Information   Behavior/Cognition Alert;Cooperative;Pleasant mood;Requires cueing      Treatment Provided   Treatment provided Cognitive-Linquistic      Cognitive-Linquistic Treatment   Treatment focused on Cognition;Dysarthria;Patient/family/caregiver education    Skilled Treatment Loud /a/ today average upper 80s dB. SLP stressed completion daily BID, and used. SLP worked with pt on abdominal breath (AB)  and loudness - pt req'd usual cues to incorporate AB with loudness - pt 40-50% successful after cue.      Assessment / Recommendations / Plan   Plan Continue with current plan of care      Progression Toward Goals   Progression toward goals Progressing toward goals              SLP Education - 02/14/21 1429     Education Details need to do /a/ and setnecnes BID    Person(s) Educated Patient;Child(ren)    Methods Explanation;Demonstration;Verbal cues    Comprehension Verbalized understanding;Returned demonstration;Verbal cues required;Need further instruction              SLP Short Term Goals - 02/12/21 1340       SLP SHORT TERM GOAL #1   Title Pt will complete dysarthria HEP with occasional min A over 2 sessions    Status  Not Met      SLP SHORT TERM GOAL #2   Title Pt will produce loud /a/ or "hey!" with at least high 80s dB average over 2 sessions    Status Not Met      SLP SHORT TERM GOAL #3   Title Pt will generate abdominal breathing in 16/20 answers to SLP questions over 2 sessions    Baseline 01-18-21    Status Partially Met      SLP SHORT TERM GOAL #4   Title Pt will demonstrate dysarthria compensations in 10 minute simple to mod complex conversation to be 75% intelligibile with occasional min A over 2 sessions    Status Not Met      SLP SHORT TERM GOAL #5   Title Pt will complete formal cognitive linguistic assessment in first 1-2 ST sessions    Status Achieved      SLP SHORT TERM GOAL #6   Title Pt will use memory and attention compensations for appointments, medicine management, and other daily activities with occasional min A over 2 sessions    Status Not Met              SLP Long Term Goals - 02/14/21 1430       SLP LONG TERM GOAL #1   Title Pt will complete dysarthria HEP with rare min A over 2 sessions    Time 2    Period Weeks    Status On-going      SLP LONG TERM GOAL #2   Title Pt will demonstrate dysarthria compensations in 5 minute simple conversation to be 90% intelligibile with rare min A over 2 sessions    Time 2    Period Weeks    Status Revised      SLP LONG TERM GOAL #3   Title Pt will demo improved sustained and selective attention during therapeutic tasks with less than 3 cues for active listening during 2 sessions    Time 2    Period Weeks    Status Deferred      SLP LONG TERM GOAL #4   Title Pt will demonstrate emergent and anticipatory awareness of impulsivity with less than 5 cues to modify behaviors during 2 sessions    Time 2    Period Weeks    Status On-going      SLP LONG TERM GOAL #5   Title Pt will report reduced frustration and improved communication effectiveness via PROM by 2 points at last ST sessions    Baseline CES: 15.5 & V-RQOL:35     Time  2    Period Weeks    Status On-going              Plan - 02/14/21 1430     Clinical Impression Statement "Walter [name]" presents for OPST intervention secondary to stroke in March 2022. SLP continued education and training pt in breath support and compensations for audible, intelligible speech. Intermittent cues required to use compensations and sustain hand placement on abdomen for abdominal breathing. Pt presents wtih improving accuracy on structured speech tasks with slight improvement in carryover at conversational level noted this session. Skilled ST is cont'd warranted to address dysarthria and cognition to maximize communication effectiveness and optimize functional independence and safety at home.    Speech Therapy Frequency 3x / week    Duration 8 weeks    Treatment/Interventions Cueing hierarchy;Functional tasks;Patient/family education;Cognitive reorganization;Multimodal communcation approach;Language facilitation;Compensatory techniques;Internal/external aids;SLP instruction and feedback    Potential to Achieve Goals Fair    Potential Considerations Ability to learn/carryover information;Cooperation/participation level    SLP Home Exercise Plan provided    Consulted and Agree with Plan of Care Patient             Patient will benefit from skilled therapeutic intervention in order to improve the following deficits and impairments:   Dysarthria and anarthria  Cognitive communication deficit    Problem List Patient Active Problem List   Diagnosis Date Noted   Thrombosis    Sleep disturbance    Dysphagia, post-stroke    PAF (paroxysmal atrial fibrillation) (HCC)    Acute pulmonary embolism without acute cor pulmonale (HCC)    Malnutrition of moderate degree 11/04/2020   Tracheostomy care ([place])    Pressure injury of skin 10/23/2020   Intracranial hemorrhage (HCC)    Atrial fibrillation ([place])    Prediabetes    Leukocytosis    Acute blood loss anemia     Hypernatremia    ICH (intracerebral hemorrhage) ([place]) 09/25/2020   Unilateral primary osteoarthritis, left knee 09/26/2016   Atrial flutter ([place]) 09/25/2013   Near syncope 09/25/2013    [facility], [place]  02/14/2021, 2:43 PM  [employer] [address] [place], [place], [address] Phone: [phone]   Fax:  [phone]   Name: Walter [name] MRN: [id number] Date of Birth: [birth year]

## 2021-02-14 NOTE — Therapy (Signed)
[facility] [address] [place], [place], [address] Phone: [phone]   Fax:  [phone]  Occupational Therapy Treatment  Patient Details  Name: Walter [name] MRN: [id number] Date of Birth: 09/14/1955 Referring Provider (OT): Dr. Alysia Penna   Encounter Date: 02/14/2021   OT End of Session - 02/14/21 1338     Visit Number 12    Number of Visits 77    Date for OT Re-Evaluation 04/27/21    Authorization Type Medicare Part A & B    Authorization Time Period 02/02/21-updated plan for 3 x week x 12 weeks, anticipate d/c after 8 weeks    Authorization - Visit Number 12    Authorization - Number of Visits 20    Progress Note Due on Visit 20    OT Start Time 1234    OT Stop Time 1315    OT Time Calculation (min) 41 min    Activity Tolerance Patient tolerated treatment well    Behavior During Therapy Impulsive             Past Medical History:  Diagnosis Date   Atrial flutter ([place])    s/p ablation   DVT (deep venous thrombosis) ([facility])    Near syncope 09/25/2013   Paroxysmal atrial fibrillation ([place])    Stroke ([place])    initially hemorragic (not on [place]) followed by subsequent embolic stroke    Past Surgical History:  Procedure Laterality Date   A-FLUTTER ABLATION N/A 03/09/2020   Procedure: A-FLUTTER ABLATION;  Surgeon: Thompson Grayer, MD;  Location: [facility];  Service: Cardiovascular;  Laterality: N/A;   CARDIOVERSION N/A 09/27/2013   Procedure: CARDIOVERSION;  Surgeon: Lelon Perla, MD;  Location: [facility] ENDOSCOPY;  Service: Cardiovascular;  Laterality: N/A;   IR ANGIO EXTERNAL CAROTID SEL EXT CAROTID UNI R MOD SED  09/26/2020   IR ANGIO INTRA EXTRACRAN SEL COM CAROTID INNOMINATE BILAT MOD SED  09/26/2020   IR ANGIO VERTEBRAL SEL VERTEBRAL UNI R MOD SED  09/26/2020   IR IVC FILTER PLMT / S&I /IMG GUID/MOD SED  10/08/2020   IR IVUS EACH ADDITIONAL NON CORONARY VESSEL  11/02/2020   IR PTA VENOUS EXCEPT  DIALYSIS CIRCUIT  11/02/2020   IR RADIOLOGIST EVAL & MGMT  01/17/2021   IR THROMBECT VENO MECH MOD SED  10/27/2020   IR THROMBECT VENO MECH MOD SED  11/02/2020   IR US GUIDE VASC ACCESS LEFT  10/27/2020   IR US GUIDE VASC ACCESS RIGHT  09/26/2020   IR US GUIDE VASC ACCESS RIGHT  11/02/2020   IR VENO/EXT/UNI RIGHT  11/02/2020   IR VENOCAVAGRAM IVC  10/27/2020   RETINAL DETACHMENT SURGERY     right knee arthroscopy     TEE WITHOUT CARDIOVERSION N/A 09/27/2013   Procedure: TRANSESOPHAGEAL ECHOCARDIOGRAM (TEE);  Surgeon: Lelon Perla, MD;  Location: [facility] ENDOSCOPY;  Service: Cardiovascular;  Laterality: N/A;   WRIST SURGERY      There were no vitals filed for this visit.   Subjective Assessment - 02/14/21 1338     Subjective  Pt's dtr is with pt today, pt's wife is out of town    Pertinent History ICH of brainstem (complicated by hydrocephalus, DVTs, and tracheostomy).  PMH:  a-fib    Limitations fall risk, ataxia, impulsivity    Patient Stated Goals use L side of body better (decrease ataxia), stay active and heal brain    Currently in Pain? No/denies  Treatment: Pt was accompanied by his dtr today. Pt transferred stand pivot with RW to mat with min A, v.c for safety as pt moved quickly taking large steps. Supine closed chain chest press and shoulder flexion, v.c and tactille cues for control. Prone lifting chest then reaching with alternate UE's, min v.c Tall kneeling with bench for functional reaching mod facilitation, reaching with alternate UE's to grasp graded clothespins, min v.c Seated closed chain shoulder flexion and diagonals, min-mod v.c Pt transferred back to w/c, min A, v.c for safety Completing 12 piece puzzle with mod v.c and increased time for scanning, problem solving and use of bilateral UE's.  Handwriting, min v.c to slow down for legibility.              OT Short Term Goals - 02/12/21 1357       OT SHORT TERM GOAL #1   Title  Pt/caregiver will be independent with initial HEP for LUE coordination and R hand strength.--    Time 4    Period Weeks    Status Achieved   needs continued reinforcement, pt is not using consistently   Target Date 03/02/21      OT SHORT TERM GOAL #2   Title Pt/caregiver will be independent with vision HEP and visual compensation strategies.    Time 4    Period Weeks    Status On-going   Pt/ family verbalize understanding of vision HEP, needs continued reinforcement of compensatory strategeis- pt is not performing head turns     OT SHORT TERM GOAL #3   Title Pt will improve LUE coordination for ADLs as shown by improving score on box and blocks test by at least 6.    Baseline L-21 blocks    Time 4    Period Weeks    Status On-going   25 blocks     OT SHORT TERM GOAL #4   Title Pt will perform simple snack prep/home maintenance task from w/c level mod I.    Time 4    Period Weeks    Status Achieved   met per pt's wife     OT SHORT TERM GOAL #5   Title Pt will verbalize understanding of compensation strategies for sensory deficts for incr safety.    Time 4    Period Weeks    Status On-going   Pt verbalized that he has to be careful about temperature, needs additional reinforcement     OT SHORT TERM GOAL #6   Title Pt will improve R hand grip stength to at least 32lbs to assist with ADLs/opening containers.    Time 4    Period Weeks    Status Achieved   33.5     OT SHORT TERM GOAL #7   Title --               OT Long Term Goals - 02/02/21 1416       OT LONG TERM GOAL #1   Title Pt will be independent with updated HEP--    Time 12    Period Weeks    Status On-going    Target Date 04/27/21      OT LONG TERM GOAL #2   Title Pt will improve L hand coordination for ADLs as shown by completing 9-hole peg test in less than 70sec.    Baseline 107sec    Time 12    Period Weeks    Status On-going      OT LONG TERM GOAL #  3   Title Pt will be mod I with toileting.     Time 12    Period Weeks    Status On-going      OT LONG TERM GOAL #4   Title Pt will perform simple snack prep/home maintenance tasks in standing with supervision.    Time 12    Period Weeks    Status On-going      OT LONG TERM GOAL #5   Title Pt will demo good safety awareness/compensation for sensory deficits and attention to LUE during functional tasks and transfers without cueing.    Time 12    Period Weeks    Status On-going      OT LONG TERM GOAL #6   Title Pt will perform simple environmental scanning/navigation with at least 90% accuracy for incr safety.    Time 12    Period Weeks    Status On-going      OT LONG TERM GOAL #7   Title Pt will perform all basic ADLS with no more than supervision.    Time 12    Period Weeks    Status New                    Patient will benefit from skilled therapeutic intervention in order to improve the following deficits and impairments:           Visit Diagnosis: Ataxia  Other lack of coordination  Other disturbances of skin sensation  Muscle weakness (generalized)  Visuospatial deficit  Frontal lobe and executive function deficit  Other symptoms and signs involving cognitive functions following other nontraumatic intracranial hemorrhage    Problem List Patient Active Problem List   Diagnosis Date Noted   Thrombosis    Sleep disturbance    Dysphagia, post-stroke    PAF (paroxysmal atrial fibrillation) (H[facility])    Acute pulmonary embolism without acute cor pulmonale (HCC)    Malnutrition of moderate degree 11/04/2020   Tracheostomy care ([place])    Pressure injury of skin 10/23/2020   Intracranial hemorrhage ([facility])    Atrial fibrillation ([place])    Prediabetes    Leukocytosis    Acute blood loss anemia    Hypernatremia    ICH (intracerebral hemorrhage) ([place]) 09/25/2020   Unilateral primary osteoarthritis, left knee 09/26/2016   Atrial flutter ([place]) 09/25/2013   Near syncope 09/25/2013     Walter [name] 02/14/2021, 1:40 PM  [facility] [address] [place], [place], [address] Phone: [phone]   Fax:  [phone]  Name: KONNER SAIZ MRN: [id number] Date of Birth: 04/21/1956

## 2021-02-14 NOTE — Patient Instructions (Signed)
   When you go to [employer] today YOU ORDER the shake! Use a loud voice and a good breath!

## 2021-02-15 ENCOUNTER — Other Ambulatory Visit: Payer: Self-pay

## 2021-02-15 ENCOUNTER — Encounter: Payer: Medicare Other | Attending: Physical Medicine & Rehabilitation | Admitting: Physical Medicine & Rehabilitation

## 2021-02-15 ENCOUNTER — Encounter: Payer: Self-pay | Admitting: Physical Medicine & Rehabilitation

## 2021-02-15 VITALS — BP 114/72 | HR 86 | Temp 99.0°F | Ht 72.0 in | Wt 185.0 lb

## 2021-02-15 DIAGNOSIS — I613 Nontraumatic intracerebral hemorrhage in brain stem: Secondary | ICD-10-CM | POA: Insufficient documentation

## 2021-02-15 DIAGNOSIS — I639 Cerebral infarction, unspecified: Secondary | ICD-10-CM

## 2021-02-15 NOTE — Progress Notes (Signed)
Subjective:    Patient ID: Walter [name], male    DOB: 1956-01-25, 65 y.o.   MRN: [id number] 65 y.o. right-handed male with history of atrial fibrillation with ablation not on anticoagulation.  Lives with spouse independent prior to admission working as a Veterinary surgeon.  Presented 09/17/2020 with right side weakness headache and dysarthria as well as vomiting.  Cranial CT/MRI showed acute intraparenchymal hemorrhage in the dorsal left pons with subarachnoid extension into the cerebral aqueduct and fourth ventricle.  CT angiogram head and neck no vascular malformation anomaly or aneurysm.  Echocardiogram with ejection fraction of 55 to 60% grade 1 diastolic dysfunction no regional wall motion abnormalities.  Admission chemistries unremarkable except glucose 169 alcohol negative urine drug screen negative.  Patient did require emergent intubation for airway protection.  Hospital course complicated by hydrocephalus with diagnostic cerebral angiogram completed showing no evidence of aneurysm.  EVD was placed per neurosurgery.  Maintained on 3% hypertonic saline.  Patient extubated 09/28/2020.  Follow-up cardiology services for atrial fibrillation maintained on Cardizem.  Patient was cleared to begin Lovenox for DVT prophylaxis 09/28/2020.  Repeat head CT scan 09/29/2020 showed new intraventricular hemorrhage into the third and lateral ventricles.  Fourth ventricular and midbrain hemorrhage unchanged and again repeated 10/24/2020 showing improved hemorrhage and edema along the shunt tract in the right frontal lobe.  No new area of hemorrhage.  Patient with long-term ventilatory support underwent tracheostomy 10/04/2020 per Dr. Merrily Pew critical care services.  On 10/08/2020 patient found to have bilateral lower extremity DVTs underwent placement of IVC filter per interventional radiology.  Dopplers again completed 10/25/2020 showing acute DVT right common femoral vein and SF junction and femoral vein proximal right profunda vein  right popliteal vein and right posterior tibial vein right peroneal vein right soleal vein and right gastrocnemius vein it was established the patient had an IVC filter maintained on Eliquis which was also initiated.  Patient with fever of unknown origin infectious disease consulted broad-spectrum antibiotics initiated.  SARS coronavirus negative.  CT chest abdomen pelvis did show bilateral large volume right greater than left pulmonary emboli no evidence of right heart strain and was discussed to continue IVC filter as well as Eliquis.  Interventional radiology consulted to discuss possible removal of IVC filter with CT venogram 10/27/2020 showing thrombus involving the IVC and iliac veins.  Large amount of thrombus involving the infrarenal IVC and IVC filter.  Suprarenal IVC was patent.  Thrombus in the right iliac veins and thrombus in the proximal femoral veins bilaterally and plan was to undergo thrombectomy and retrieval of IVC filter 10/27/2020 per Dr. Deanne Coffer of interventional radiology.  Patient initially n.p.o. with alternative means of nutritional support diet slowly advance.    Admit date: 10/28/2020 Discharge date: 11/23/2020 HPI Continues with outpatient PT OT speech having approximately 3 sessions per week.  Has a neuro-ophthalmology appointment scheduled.  We went over stroke location and how it impacts the 3rd and 4th cranial nerves causing  double vision. Per daughter cognitive issues have been improving now that the patient has slept better.  Mirtazapine has been prescribed by neurology. The patient has had no falls.  He has no new complaints.  He does have some swelling of the right hand status post retrograde massage as well as contrast baths. We looked at MRI of his stroke initially as well as the 30-month follow-up.  Pain Inventory Average Pain 0 Pain Right Now 0 My pain is intermittent  LOCATION OF PAIN  head hand  BOWEL  Number of stools per week:  normal  BLADDER Normal Frequent urination Yes     Mobility walk with assistance use a walker ability to climb steps?  yes do you drive?  no use a wheelchair  Function retired  Neuro/Psych bladder control problems weakness numbness tremor tingling confusion depression anxiety  Prior Studies Any changes since last visit?  no  Physicians involved in your care Any changes since last visit?  no   Family History  Problem Relation Age of Onset   Heart disease Mother    Heart disease Father    Healthy Sister    Healthy Brother    Social History   Socioeconomic History   Marital status: Married    Spouse name: cathy   Number of children: Not on file   Years of education: Not on file   Highest education level: Not on file  Occupational History   Not on file  Tobacco Use   Smoking status: Never   Smokeless tobacco: Never  Substance and Sexual Activity   Alcohol use: Yes    Comment: occasionally   Drug use: No   Sexual activity: Not on file  Other Topics Concern   Not on file  Social History Narrative   Lives with wife at home   Right Handed   Drinks decaf   Social Determinants of Health   Financial Resource Strain: Not on file  Food Insecurity: Not on file  Transportation Needs: Not on file  Physical Activity: Not on file  Stress: Not on file  Social Connections: Not on file   Past Surgical History:  Procedure Laterality Date   A-FLUTTER ABLATION N/A 03/09/2020   Procedure: A-FLUTTER ABLATION;  Surgeon: Hillis Range, MD;  Location: [facility];  Service: Cardiovascular;  Laterality: N/A;   CARDIOVERSION N/A 09/27/2013   Procedure: CARDIOVERSION;  Surgeon: Lewayne Bunting, MD;  Location: [facility] ENDOSCOPY;  Service: Cardiovascular;  Laterality: N/A;   IR ANGIO EXTERNAL CAROTID SEL EXT CAROTID UNI R MOD SED  09/26/2020   IR ANGIO INTRA EXTRACRAN SEL COM CAROTID INNOMINATE BILAT MOD SED  09/26/2020   IR ANGIO VERTEBRAL SEL VERTEBRAL UNI R MOD SED   09/26/2020   IR IVC FILTER PLMT / S&I /IMG GUID/MOD SED  10/08/2020   IR IVUS EACH ADDITIONAL NON CORONARY VESSEL  11/02/2020   IR PTA VENOUS EXCEPT DIALYSIS CIRCUIT  11/02/2020   IR RADIOLOGIST EVAL & MGMT  01/17/2021   IR THROMBECT VENO MECH MOD SED  10/27/2020   IR THROMBECT VENO MECH MOD SED  11/02/2020   IR US GUIDE VASC ACCESS LEFT  10/27/2020   IR US GUIDE VASC ACCESS RIGHT  09/26/2020   IR US GUIDE VASC ACCESS RIGHT  11/02/2020   IR VENO/EXT/UNI RIGHT  11/02/2020   IR VENOCAVAGRAM IVC  10/27/2020   RETINAL DETACHMENT SURGERY     right knee arthroscopy     TEE WITHOUT CARDIOVERSION N/A 09/27/2013   Procedure: TRANSESOPHAGEAL ECHOCARDIOGRAM (TEE);  Surgeon: Lewayne Bunting, MD;  Location: [facility] ENDOSCOPY;  Service: Cardiovascular;  Laterality: N/A;   WRIST SURGERY     Past Medical History:  Diagnosis Date   Atrial flutter ([facility])    s/p ablation   DVT (deep venous thrombosis) ([facility])    Near syncope 09/25/2013   Paroxysmal atrial fibrillation ([facility])    Stroke [facility])    initially hemorragic (not on OAC) followed by subsequent embolic stroke   BP 114/72   Pulse 86   Temp 99 F (  37.2 C)   Ht 6' (1.829 m)   Wt 185 lb (83.9 kg)   SpO2 94%   BMI 25.09 kg/m   Opioid Risk Score:   Fall Risk Score:  `1  Depression screen PHQ 2/9  Depression screen [facility] 2/9 01/09/2021 11/30/2020  Decreased Interest 1 1  Down, Depressed, Hopeless 1 1  PHQ - 2 Score 2 2  Altered sleeping - 3  Tired, decreased energy - 3  Change in appetite - 0  Feeling bad or failure about yourself  - 1  Trouble concentrating - 3  Moving slowly or fidgety/restless - 3  Suicidal thoughts - 1  PHQ-9 Score - 16  Difficult doing work/chores - Somewhat difficult    Review of Systems  Constitutional: Negative.   HENT: Negative.    Eyes: Negative.   Respiratory: Negative.    Cardiovascular: Negative.   Gastrointestinal: Negative.   Endocrine: Negative.   Genitourinary:  Positive for difficulty urinating and frequency.   Musculoskeletal:  Positive for arthralgias and gait problem.  Skin: Negative.   Allergic/Immunologic: Negative.   Neurological:  Positive for tremors, weakness, numbness and headaches.       Tingling  Psychiatric/Behavioral:  Positive for dysphoric mood. The patient is nervous/anxious.   All other systems reviewed and are negative.     Objective:   Physical Exam Vitals and nursing note reviewed.  Constitutional:      Appearance: He is normal weight.  HENT:     Head: Normocephalic and atraumatic.  Eyes:     General: No visual field deficit.    Extraocular Movements: Extraocular movements intact.     Conjunctiva/sclera: Conjunctivae normal.     Pupils: Pupils are equal, round, and reactive to light.  Neurological:     Mental Status: He is alert and oriented to person, place, and time.     Cranial Nerves: Dysarthria present.     Comments: The patient has skew deviation , no ptosis, upward, downward and lateral gaze intact Motor Right upper extremity 4/5 in the deltoid, bicep, tricep, grip,  Right lower extremity 5/5 in the hip flexor knee extensor ankle dorsiflexor Left side is 5/5 in the deltoid bicep tricep grip hip flexor knee extensor ankle dorsiflexor and plantar flexor. There is moderate dysmetria on finger-nose-finger testing on the left side which persist even after closing 1 eye.  Psychiatric:        Mood and Affect: Mood normal.        Behavior: Behavior normal.          Assessment & Plan:   1.  Left midbrain hemorrhagic stroke with some extension into the pons and thalamus.  Most recent imaging studies showed encephalomalacia in the midbrain but not in the pons.  No obvious vascular malformations were noted.  Repeat imaging in 2 to 3 months was recommended. He is continue to make some improvements with functional status.  Not much difference in terms of strength or with his dysmetria however it is now starting to put this altogether likely due to the improvements  in cognition. Continue outpatient PT OT speech Will incorporate aquatic therapy into his current therapy plan. Physical medicine rehab follow-up in 2 months Answered questions from the patient as well as the daughter. Retrograde massage for mild edema dorsum of the right hand.  May also try contrast baths. Neuro-ophthalmology evaluation upcoming. Follow-up with neurology in October

## 2021-02-15 NOTE — Patient Instructions (Signed)
Great progress, hopefully you can walk with a cane next time I see you!  Try retrograde massage for your Right hand.  100 strokes per day

## 2021-02-16 ENCOUNTER — Ambulatory Visit: Payer: Medicare Other

## 2021-02-16 ENCOUNTER — Ambulatory Visit: Payer: Medicare Other | Admitting: Occupational Therapy

## 2021-02-16 DIAGNOSIS — R260 Ataxic gait: Secondary | ICD-10-CM

## 2021-02-16 DIAGNOSIS — R471 Dysarthria and anarthria: Secondary | ICD-10-CM | POA: Diagnosis not present

## 2021-02-16 DIAGNOSIS — R208 Other disturbances of skin sensation: Secondary | ICD-10-CM

## 2021-02-16 DIAGNOSIS — R270 Ataxia, unspecified: Secondary | ICD-10-CM

## 2021-02-16 DIAGNOSIS — R41844 Frontal lobe and executive function deficit: Secondary | ICD-10-CM

## 2021-02-16 DIAGNOSIS — R278 Other lack of coordination: Secondary | ICD-10-CM

## 2021-02-16 DIAGNOSIS — R41841 Cognitive communication deficit: Secondary | ICD-10-CM

## 2021-02-16 DIAGNOSIS — R41842 Visuospatial deficit: Secondary | ICD-10-CM

## 2021-02-16 DIAGNOSIS — M6281 Muscle weakness (generalized): Secondary | ICD-10-CM

## 2021-02-16 DIAGNOSIS — I69218 Other symptoms and signs involving cognitive functions following other nontraumatic intracranial hemorrhage: Secondary | ICD-10-CM

## 2021-02-16 NOTE — Patient Instructions (Signed)
     Edema Reduction (Retrograde Massage)    A. Enclose tip of finger with other hand and slide toward wrist use lotion B. For larger areas, massage toward the body in one direction only. Perfrom for 5 mins 1-2 x per day  Copyright  VHI. All rights reserved.

## 2021-02-16 NOTE — Therapy (Signed)
[facility] [address] [place], [place], [address] Phone: [phone]   Fax:  [phone]  Speech Language Pathology Treatment  Patient Details  Name: Walter [name] MRN: [id number] Date of Birth: 01-13-1956 Referring Provider (SLP): Charlett Blake, MD   Encounter Date: 02/16/2021   End of Session - 02/16/21 1150     Visit Number 15    Number of Visits 17    Date for SLP Re-Evaluation 02/21/21    Authorization Type Medicare    SLP Start Time 1015    SLP Stop Time  1100    SLP Time Calculation (min) 45 min    Activity Tolerance Patient tolerated treatment well             Past Medical History:  Diagnosis Date   Atrial flutter ([place])    s/p ablation   DVT (deep venous thrombosis) ([place])    Near syncope 09/25/2013   Paroxysmal atrial fibrillation ([place])    Stroke [facility])    initially hemorragic (not on [facility]) followed by subsequent embolic stroke    Past Surgical History:  Procedure Laterality Date   A-FLUTTER ABLATION N/A 03/09/2020   Procedure: A-FLUTTER ABLATION;  Surgeon: Thompson Grayer, MD;  Location: [place] CV LAB;  Service: Cardiovascular;  Laterality: N/A;   CARDIOVERSION N/A 09/27/2013   Procedure: CARDIOVERSION;  Surgeon: Lelon Perla, MD;  Location: [facility] ENDOSCOPY;  Service: Cardiovascular;  Laterality: N/A;   IR ANGIO EXTERNAL CAROTID SEL EXT CAROTID UNI R MOD SED  09/26/2020   IR ANGIO INTRA EXTRACRAN SEL COM CAROTID INNOMINATE BILAT MOD SED  09/26/2020   IR ANGIO VERTEBRAL SEL VERTEBRAL UNI R MOD SED  09/26/2020   IR IVC FILTER PLMT / S&I /IMG GUID/MOD SED  10/08/2020   IR IVUS EACH ADDITIONAL NON CORONARY VESSEL  11/02/2020   IR PTA VENOUS EXCEPT DIALYSIS CIRCUIT  11/02/2020   IR RADIOLOGIST EVAL & MGMT  01/17/2021   IR THROMBECT VENO MECH MOD SED  10/27/2020   IR THROMBECT VENO MECH MOD SED  11/02/2020   IR US GUIDE VASC ACCESS LEFT  10/27/2020   IR US GUIDE VASC ACCESS RIGHT  09/26/2020   IR US  GUIDE VASC ACCESS RIGHT  11/02/2020   IR VENO/EXT/UNI RIGHT  11/02/2020   IR VENOCAVAGRAM IVC  10/27/2020   RETINAL DETACHMENT SURGERY     right knee arthroscopy     TEE WITHOUT CARDIOVERSION N/A 09/27/2013   Procedure: TRANSESOPHAGEAL ECHOCARDIOGRAM (TEE);  Surgeon: Lelon Perla, MD;  Location: [facility] ENDOSCOPY;  Service: Cardiovascular;  Laterality: N/A;   WRIST SURGERY      There were no vitals filed for this visit.   Subjective Assessment - 02/16/21 1031     Subjective "I did my "ahhh" for my buddies"    Patient is accompained by: Family member   caregiver and daughter   Currently in Pain? No/denies                   ADULT SLP TREATMENT - 02/16/21 1017       General Information   Behavior/Cognition Alert;Cooperative;Pleasant mood;Requires cueing      Treatment Provided   Treatment provided Cognitive-Linquistic      Cognitive-Linquistic Treatment   Treatment focused on Cognition;Dysarthria;Patient/family/caregiver education    Skilled Treatment Loud /a/ today averaged upper 80s dB. SLP stressed completion daily BID, which pt reported he is completing 5 reps BID. Improved use of abdominal breath (AB) exhibited for sustained phonation with  rare min A. SLP targeted utilizing abdominal breath for speaking tasks (functional sentences and short conversation), in which pt req'd usual cues to incorporate AB and rare cues to increase volume. Overall improved volume noted and maintained, with mild changes in vocal quality (hoarse, wet) and reduced breath support exhibited. SLP educated caregiver and daughter to providing visual cues by pointing at nose to breathe, as pt was not aware of other cues (SLP talking large inhalation) and exhibits difficulty maintaining hand placement on abdomen due to ataxia.      Assessment / Recommendations / Plan   Plan Continue with current plan of care      Progression Toward Goals   Progression toward goals Progressing toward goals               SLP Education - 02/16/21 1149     Education Details HEP BID, visual cues to inhale via nose    Person(s) Educated Patient;Child(ren);Caregiver(s)    Methods Explanation;Demonstration;Verbal cues    Comprehension Verbalized understanding;Returned demonstration;Verbal cues required              SLP Short Term Goals - 02/12/21 1340       SLP SHORT TERM GOAL #1   Title Pt will complete dysarthria HEP with occasional min A over 2 sessions    Status Not Met      SLP SHORT TERM GOAL #2   Title Pt will produce loud /a/ or "hey!" with at least high 80s dB average over 2 sessions    Status Not Met      SLP SHORT TERM GOAL #3   Title Pt will generate abdominal breathing in 16/20 answers to SLP questions over 2 sessions    Baseline 01-18-21    Status Partially Met      SLP SHORT TERM GOAL #4   Title Pt will demonstrate dysarthria compensations in 10 minute simple to mod complex conversation to be 75% intelligibile with occasional min A over 2 sessions    Status Not Met      SLP SHORT TERM GOAL #5   Title Pt will complete formal cognitive linguistic assessment in first 1-2 ST sessions    Status Achieved      SLP SHORT TERM GOAL #6   Title Pt will use memory and attention compensations for appointments, medicine management, and other daily activities with occasional min A over 2 sessions    Status Not Met              SLP Long Term Goals - 02/16/21 1151       SLP LONG TERM GOAL #1   Title Pt will complete dysarthria HEP with rare min A over 2 sessions    Time 2    Period Weeks    Status On-going      SLP LONG TERM GOAL #2   Title Pt will demonstrate dysarthria compensations in 5 minute simple conversation to be 90% intelligibile with rare min A over 2 sessions    Time 2    Period Weeks    Status On-going      SLP LONG TERM GOAL #3   Title Pt will demo improved sustained and selective attention during therapeutic tasks with less than 3 cues for active listening  during 2 sessions    Time 2    Period Weeks    Status Deferred      SLP LONG TERM GOAL #4   Title Pt will demonstrate emergent and anticipatory awareness of impulsivity  with less than 5 cues to modify behaviors during 2 sessions    Time 2    Period Weeks    Status On-going      SLP LONG TERM GOAL #5   Title Pt will report reduced frustration and improved communication effectiveness via PROM by 2 points at last ST sessions    Baseline CES: 15.5 & V-RQOL:35    Time 2    Period Weeks    Status On-going              Plan - 02/16/21 1150     Clinical Impression Statement "Walter [name]" presents for OPST intervention secondary to stroke in March 2022. SLP continued education and training pt in breath support and compensations for audible, intelligible speech. Occasional cues required to use compensations and utilize abdominal breathing on speech tasks. Pt presents wtih improving accuracy on structured speech tasks with slight improvement in carryover at conversational level noted this session. Skilled ST is cont'd warranted to address dysarthria and cognition to maximize communication effectiveness and optimize functional independence and safety at home.    Speech Therapy Frequency 3x / week    Duration 8 weeks    Treatment/Interventions Cueing hierarchy;Functional tasks;Patient/family education;Cognitive reorganization;Multimodal communcation approach;Language facilitation;Compensatory techniques;Internal/external aids;SLP instruction and feedback    Potential to Achieve Goals Fair    Potential Considerations Ability to learn/carryover information;Cooperation/participation level    SLP Home Exercise Plan provided    Consulted and Agree with Plan of Care Patient;Family member/caregiver             Patient will benefit from skilled therapeutic intervention in order to improve the following deficits and impairments:   Dysarthria and anarthria  Cognitive communication  deficit    Problem List Patient Active Problem List   Diagnosis Date Noted   Thrombosis    Sleep disturbance    Dysphagia, post-stroke    PAF (paroxysmal atrial fibrillation) ([place])    Acute pulmonary embolism without acute cor pulmonale (HCC)    Malnutrition of moderate degree 11/04/2020   Tracheostomy care ([place])    Pressure injury of skin 10/23/2020   Intracranial hemorrhage (HCC)    Atrial fibrillation ([place])    Prediabetes    Leukocytosis    Acute blood loss anemia    Hypernatremia    ICH (intracerebral hemorrhage) ([place]) 09/25/2020   Unilateral primary osteoarthritis, left knee 09/26/2016   Atrial flutter ([place]) 09/25/2013   Near syncope 09/25/2013    Alinda Deem, [name] 02/16/2021, 11:52 AM  [facility] [address] [place], [place], [address] Phone: [phone]   Fax:  [phone]   Name: Walter [name] MRN: [id number] Date of Birth: 05/29/1956

## 2021-02-16 NOTE — Therapy (Signed)
OUTPATIENT PHYSICAL THERAPY TREATMENT NOTE  Patient Name: Walter [name] MRN: [id number] DOB:[birth year], 65 y.o., male Today's Date: 02/16/2021  PCP: Alroy Dust, L.Marlou Sa, MD REFERRING PROVIDER: Alroy Dust, Carlean Jews.Marlou Sa, MD       Past Medical History:  Diagnosis Date   Atrial flutter [facility])    s/p ablation   DVT (deep venous thrombosis) ([place])    Near syncope 09/25/2013   Paroxysmal atrial fibrillation ([place])    Stroke [facility])    initially hemorragic (not on [place]) followed by subsequent embolic stroke   Past Surgical History:  Procedure Laterality Date   A-FLUTTER ABLATION N/A 03/09/2020   Procedure: A-FLUTTER ABLATION;  Surgeon: Thompson Grayer, MD;  Location: [place] CV LAB;  Service: Cardiovascular;  Laterality: N/A;   CARDIOVERSION N/A 09/27/2013   Procedure: CARDIOVERSION;  Surgeon: Lelon Perla, MD;  Location: [facility] ENDOSCOPY;  Service: Cardiovascular;  Laterality: N/A;   IR ANGIO EXTERNAL CAROTID SEL EXT CAROTID UNI R MOD SED  09/26/2020   IR ANGIO INTRA EXTRACRAN SEL COM CAROTID INNOMINATE BILAT MOD SED  09/26/2020   IR ANGIO VERTEBRAL SEL VERTEBRAL UNI R MOD SED  09/26/2020   IR IVC FILTER PLMT / S&I /IMG GUID/MOD SED  10/08/2020   IR IVUS EACH ADDITIONAL NON CORONARY VESSEL  11/02/2020   IR PTA VENOUS EXCEPT DIALYSIS CIRCUIT  11/02/2020   IR RADIOLOGIST EVAL & MGMT  01/17/2021   IR THROMBECT VENO MECH MOD SED  10/27/2020   IR THROMBECT VENO MECH MOD SED  11/02/2020   IR US GUIDE VASC ACCESS LEFT  10/27/2020   IR US GUIDE VASC ACCESS RIGHT  09/26/2020   IR US GUIDE VASC ACCESS RIGHT  11/02/2020   IR VENO/EXT/UNI RIGHT  11/02/2020   IR VENOCAVAGRAM IVC  10/27/2020   RETINAL DETACHMENT SURGERY     right knee arthroscopy     TEE WITHOUT CARDIOVERSION N/A 09/27/2013   Procedure: TRANSESOPHAGEAL ECHOCARDIOGRAM (TEE);  Surgeon: Lelon Perla, MD;  Location: [facility] ENDOSCOPY;  Service: Cardiovascular;  Laterality: N/A;   WRIST SURGERY     Patient Active Problem List   Diagnosis Date Noted    Thrombosis    Sleep disturbance    Dysphagia, post-stroke    PAF (paroxysmal atrial fibrillation) ([place])    Acute pulmonary embolism without acute cor pulmonale (HCC)    Malnutrition of moderate degree 11/04/2020   Tracheostomy care ([place])    Pressure injury of skin 10/23/2020   Intracranial hemorrhage (HCC)    Atrial fibrillation (HCC)    Prediabetes    Leukocytosis    Acute blood loss anemia    Hypernatremia    ICH (intracerebral hemorrhage) ([place]) 09/25/2020   Unilateral primary osteoarthritis, left knee 09/26/2016   Atrial flutter ([place]) 09/25/2013   Near syncope 09/25/2013    REFERRING DIAG: I61.3 (ICD-10-CM) - 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 ([place]) I69.193 (ICD-10-CM) - 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  PERTINENT HISTORY: atrial flutter s/p ablation, DVT, near syncope, CVA  PRECAUTIONS: fall  SUBJECTIVE: Saw MD and recommended to start aquatic therapy.  Has  been walking outside with Cgs with WC follow  Objective:   TODAY'S TREATMENT:    02/16/21: Sit to stand 5x encouraging midline Gait training: 230 feet with RW, cues for standing up tall, keeping in middle of walker, focus on step length and foot placement, tendency speed up and lose control but able to correct cadence when cued Standing in // bars, stepping to 4" block,  10x alternating pattern, anterior and lateral stepping with weight shifting, cued for postural correction. Static standing on Airex in // bars, 3x 60s with diminishing levels of CGA and no UE support Forward, retro and sidestepping in // bars, BUE support, 5 trips Vcs to shorten steps and control foot placement   02/12/2021:  Transfers: sit > stand from transport wheelchair with RW and cues to bring chest forward in midline for controlled, equal WB during sit > stand.  Pt consistently performed sit <> stand with min A.  Stand pivot to mat with RW and min A and max verbal cues to  sequence pivot.  Pt required cues intermittently to keep close to RW when pivoting for safety.  NMR with physioball in sitting: LUE on ball on L side performed lateral weight shifts to L pushing ball and elongating trunk on L side x 10 reps, therapist provided cues for head righting back to midline.  Changed and put ball in front of pt.: placed bilat UE on ball and performed leaning forwards pushing ball out in midline keeping head upright and trunk upright to weight shift over BOS x 5 reps, changed to weight shifting forwards and coming to a squat in midline x 5 reps.  Progressed to 5 reps coming to a squat and lifting RUE off ball for increased weight shift to L x 5 second hold.  Gait: Side stepping with RW to L and R x 15' with min A and verbal cues to sequence; discussed how pt could use side stepping to negotiate through narrow spaces/doorways instead of trying to fit through with RW going forwards.  Performed walking in hallway with LUE HHA and RUE reaching up and to the R - cues to keep RUE high on wall to facilitate increased weight shift to R and proprioceptive feedback to decrease pt veering too far to L when ambulating.  Pt required mod A to perform x 4 reps.  02/08/21  Sit to stand no HHA 10x from mat with SBA and OH reach Gait training: 2 x 115 feet with RW, cues for standing up tall, keeping in middle of walker, tendency to veer L noted due to L eye patch, postural correction emphasized Standing in walker, step taps to 4" block, 10x alternating pattern, 2 sets Latissimus press 15x Seated FAQs 3# alt 15x w/lat press Seated marching 3# alt 15x w/lat press Seated heel slides using towel 15x per side Seated core exercises of hip and shoulder tosses, chops and Vs with 3.3# ball, 10x ea.  Seated OH lift with inspiration using 3.3# ball, 10x  Seated forward flexion with HHA from PT 10x with B twisting added in combination with FF, 10x to ea. side     PATIENT EDUCATION: Education details:  Side stepping with RW through narrow spaces Person educated: Patient and caregiver Education method: Explanation, Demonstration, and Verbal cues Education comprehension: verbalized understanding     HOME EXERCISE PROGRAM: Continue daily walking with CG assist  ASSESSMENT:   CLINICAL IMPRESSION: Continued gait and balance training emphasizing pacing, proper step length and cadence as well as good safety awareness.  Ataxia appears to be lessening with patient much more aware in recognizing issues.  LLE remains most affected.  Incorporated sidestepping and retrowalking // bars to encourage foot placement tasks.   REHAB POTENTIAL: Good   CLINICAL DECISION MAKING: Stable/uncomplicated  GOALS: Goals reviewed with patient? Yes   SHORT TERM GOALS:   STG Name Target Date Goal status  1 Patient will be  able to perform chair to chair transfer with CGA/SBA with use of RW for safety to improve independence. Baseline: min to mod A (12/26/20) 01/23/2021 02/05/21 Able to transfer STS with SBA  2 Pt will be able to ambulate 115' with RW with CGA to min A with RW for safety to improve independence Baseline: min to mod A with RW 145' 01/23/2021 02/05/21 Ambulation of 172fx2 with RW and CGA/minA   3 Pt will be able to maintain standing balance for 30 sec to improve static balance Baseline: 10 sec before needing help due to LOB (12/26/20) 01/23/2021 02/05/21 able to stand wit S for 30s, goal met    LONG TERM GOALS:   LTG Name Target Date Goal status  1 Pt will demo >5 points improvement on Berg Balance Scale to improve static balance Baseline: 11/56 01/03/21 02/20/2021 INITIAL  2 Pt will be able to ambulate >500 feet with RW and SBA to improve walking endurance and safety. Baseline: 145' with RW min to mod A 02/20/2021 INITIAL  3 Pt will be able to perform gait speed improvement by 0.166m to improve community ambulation. Baseline:TBD 02/20/2021 INITIAL  4 Pt will demo 5 sec improvement in 5x sit to stand (with  or without UE support) to improve functional strength Baseline: TBD 02/20/2021 INITIAL    PLAN: PT FREQUENCY: 2x/week   PT DURATION: 8 weeks   PLANNED INTERVENTIONS: Therapeutic exercises, Therapeutic activity, Neuro Muscular re-education, Balance training, Gait training, Patient/Family education, Joint mobilization, Stair training, Visual/preceptual remediation/compensation, Wheelchair mobility training, Cryotherapy, Moist heat, and Manual therapy   PLAN FOR NEXT SESSION: Standing/walking tasks in // bars, floor targets and stepping tasks, higher level gait training-stepping over and around obstacles, head and trunk righting    Visit Diagnosis: No diagnosis found.     Problem List Patient Active Problem List   Diagnosis Date Noted   Thrombosis    Sleep disturbance    Dysphagia, post-stroke    PAF ([place]mal [place] fibrillation) ([place]   Acute pulmonary embolism without acute cor pulmonale (HCC)    Malnutrition of moderate degree 11/04/2020   Tracheostomy care ([place]   Pressure injury of skin 10/23/2020   Intracranial hemorrhage (HCC)    Atrial fibrillation ([place]   Prediabetes    Leukocytosis    Acute blood loss anemia    Hypernatremia    ICH (intracerebral hemorrhage) ([place]03/28/2022   Unilateral primary osteoarthritis, left knee 09/26/2016   [place]rial flutter ([place]/2015   Near syncope 09/25/2013     Walter [name] 02/16/21    9:38 AM    [address]u[id number]: [phone] Fax:  [phone]: Walter MEUSERRN: [id number] of Birth: [birth year]

## 2021-02-16 NOTE — Therapy (Signed)
[facility] [address] [place], [place], [address] Phone: [phone]   Fax:  [phone]  Occupational Therapy Treatment  Patient Details  Name: Walter [name] MRN: [id number] Date of Birth: 1956-04-29 Referring Provider (OT): Dr. Alysia Penna   Encounter Date: 02/16/2021   OT End of Session - 02/16/21 1258     Visit Number 13    Number of Visits 24    Date for OT Re-Evaluation 04/27/21    Authorization Type Medicare Part A & B    Authorization Time Period 02/02/21-updated plan for 3 x week x 12 weeks, anticipate d/c after 8 weeks    Authorization - Visit Number 13    Authorization - Number of Visits 20    Progress Note Due on Visit 20    OT Start Time 0850    OT Stop Time 0928    OT Time Calculation (min) 38 min    Activity Tolerance Patient tolerated treatment well    Behavior During Therapy Impulsive             Past Medical History:  Diagnosis Date   Atrial flutter ([place])    s/p ablation   DVT (deep venous thrombosis) ([place])    Near syncope 09/25/2013   Paroxysmal atrial fibrillation ([place])    Stroke ([place])    initially hemorragic (not on [place]) followed by subsequent embolic stroke    Past Surgical History:  Procedure Laterality Date   A-FLUTTER ABLATION N/A 03/09/2020   Procedure: A-FLUTTER ABLATION;  Surgeon: Thompson Grayer, MD;  Location: [facility];  Service: Cardiovascular;  Laterality: N/A;   CARDIOVERSION N/A 09/27/2013   Procedure: CARDIOVERSION;  Surgeon: Lelon Perla, MD;  Location: [facility] ENDOSCOPY;  Service: Cardiovascular;  Laterality: N/A;   IR ANGIO EXTERNAL CAROTID SEL EXT CAROTID UNI R MOD SED  09/26/2020   IR ANGIO INTRA EXTRACRAN SEL COM CAROTID INNOMINATE BILAT MOD SED  09/26/2020   IR ANGIO VERTEBRAL SEL VERTEBRAL UNI R MOD SED  09/26/2020   IR IVC FILTER PLMT / S&I /IMG GUID/MOD SED  10/08/2020   IR IVUS EACH ADDITIONAL NON CORONARY VESSEL  11/02/2020   IR PTA VENOUS EXCEPT  DIALYSIS CIRCUIT  11/02/2020   IR RADIOLOGIST EVAL & MGMT  01/17/2021   IR THROMBECT VENO MECH MOD SED  10/27/2020   IR THROMBECT VENO MECH MOD SED  11/02/2020   IR US GUIDE VASC ACCESS LEFT  10/27/2020   IR US GUIDE VASC ACCESS RIGHT  09/26/2020   IR US GUIDE VASC ACCESS RIGHT  11/02/2020   IR VENO/EXT/UNI RIGHT  11/02/2020   IR VENOCAVAGRAM IVC  10/27/2020   RETINAL DETACHMENT SURGERY     right knee arthroscopy     TEE WITHOUT CARDIOVERSION N/A 09/27/2013   Procedure: TRANSESOPHAGEAL ECHOCARDIOGRAM (TEE);  Surgeon: Lelon Perla, MD;  Location: [facility] ENDOSCOPY;  Service: Cardiovascular;  Laterality: N/A;   WRIST SURGERY      There were no vitals filed for this visit.   Subjective Assessment - 02/16/21 1304     Pertinent History ICH of brainstem (complicated by hydrocephalus, DVTs, and tracheostomy).  PMH:  a-fib    Limitations fall risk, ataxia, impulsivity    Patient Stated Goals use L side of body better (decrease ataxia), stay active and heal brain    Currently in Pain? Yes    Pain Score 2     Pain Location Hand    Pain Orientation Right    Pain Descriptors /  Indicators Aching    Pain Type Chronic pain    Pain Onset More than a month ago    Pain Frequency Intermittent    Aggravating Factors  unknown    Pain Relieving Factors exercises                 Treatment: standing at the sink to wash hands, minguard assist, v.c for safety with sit-stand. Standing at countertop with minguard to place items on low shelf with left then right UE's, min v.c Copying peg design with medium sized pegs using left and right UE's for increased fine motor coordination, min difficulty/ v.c                  OT Education - 02/16/21 1301     Education Details education regarding retrograde massage- handout issued/ therapist demonstrated, tendongliding exercises and pt was issued edema control handout.    Person(s) Educated Patient;Caregiver(s)   Claiborne Billings   Methods Explanation;Verbal  cues;Handout    Comprehension Verbalized understanding;Verbal cues required;Tactile cues required              OT Short Term Goals - 02/12/21 1357       OT SHORT TERM GOAL #1   Title Pt/caregiver will be independent with initial HEP for LUE coordination and R hand strength.--    Time 4    Period Weeks    Status Achieved   needs continued reinforcement, pt is not using consistently   Target Date 03/02/21      OT SHORT TERM GOAL #2   Title Pt/caregiver will be independent with vision HEP and visual compensation strategies.    Time 4    Period Weeks    Status On-going   Pt/ family verbalize understanding of vision HEP, needs continued reinforcement of compensatory strategeis- pt is not performing head turns     OT SHORT TERM GOAL #3   Title Pt will improve LUE coordination for ADLs as shown by improving score on box and blocks test by at least 6.    Baseline L-21 blocks    Time 4    Period Weeks    Status On-going   25 blocks     OT SHORT TERM GOAL #4   Title Pt will perform simple snack prep/home maintenance task from w/c level mod I.    Time 4    Period Weeks    Status Achieved   met per pt's wife     OT SHORT TERM GOAL #5   Title Pt will verbalize understanding of compensation strategies for sensory deficts for incr safety.    Time 4    Period Weeks    Status On-going   Pt verbalized that he has to be careful about temperature, needs additional reinforcement     OT SHORT TERM GOAL #6   Title Pt will improve R hand grip stength to at least 32lbs to assist with ADLs/opening containers.    Time 4    Period Weeks    Status Achieved   33.5     OT SHORT TERM GOAL #7   Title --               OT Long Term Goals - 02/02/21 1416       OT LONG TERM GOAL #1   Title Pt will be independent with updated HEP--    Time 12    Period Weeks    Status On-going    Target Date 04/27/21  OT LONG TERM GOAL #2   Title Pt will improve L hand coordination for ADLs as  shown by completing 9-hole peg test in less than 70sec.    Baseline 107sec    Time 12    Period Weeks    Status On-going      OT LONG TERM GOAL #3   Title Pt will be mod I with toileting.    Time 12    Period Weeks    Status On-going      OT LONG TERM GOAL #4   Title Pt will perform simple snack prep/home maintenance tasks in standing with supervision.    Time 12    Period Weeks    Status On-going      OT LONG TERM GOAL #5   Title Pt will demo good safety awareness/compensation for sensory deficits and attention to LUE during functional tasks and transfers without cueing.    Time 12    Period Weeks    Status On-going      OT LONG TERM GOAL #6   Title Pt will perform simple environmental scanning/navigation with at least 90% accuracy for incr safety.    Time 12    Period Weeks    Status On-going      OT LONG TERM GOAL #7   Title Pt will perform all basic ADLS with no more than supervision.    Time 12    Period Weeks    Status New                    Patient will benefit from skilled therapeutic intervention in order to improve the following deficits and impairments:           Visit Diagnosis: Ataxia  Other lack of coordination  Other disturbances of skin sensation  Muscle weakness (generalized)  Visuospatial deficit  Frontal lobe and executive function deficit  Other symptoms and signs involving cognitive functions following other nontraumatic intracranial hemorrhage    Problem List Patient Active Problem List   Diagnosis Date Noted   Thrombosis    Sleep disturbance    Dysphagia, post-stroke    PAF (paroxysmal atrial fibrillation) ([facility])    Acute pulmonary embolism without acute cor pulmonale (H[facility])    Malnutrition of moderate degree 11/04/2020   Tracheostomy care ([place])    Pressure injury of skin 10/23/2020   Intracranial hemorrhage ([facility])    Atrial fibrillation ([place])    Prediabetes    Leukocytosis    Acute blood loss anemia     Hypernatremia    ICH (intracerebral hemorrhage) ([place]) 09/25/2020   Unilateral primary osteoarthritis, left knee 09/26/2016   Atrial flutter ([place]) 09/25/2013   Near syncope 09/25/2013    Vianka Ertel 02/16/2021, 1:05 PM  [facility] [address] [place], [place], [address] Phone: [phone]   Fax:  [phone]  Name: Walter [name] MRN: [id number] Date of Birth: 27-Feb-1956

## 2021-02-19 ENCOUNTER — Ambulatory Visit: Payer: Medicare Other

## 2021-02-19 ENCOUNTER — Other Ambulatory Visit: Payer: Self-pay

## 2021-02-19 ENCOUNTER — Encounter: Payer: Self-pay | Admitting: Occupational Therapy

## 2021-02-19 ENCOUNTER — Ambulatory Visit: Payer: Medicare Other | Admitting: Occupational Therapy

## 2021-02-19 DIAGNOSIS — R471 Dysarthria and anarthria: Secondary | ICD-10-CM

## 2021-02-19 DIAGNOSIS — R270 Ataxia, unspecified: Secondary | ICD-10-CM

## 2021-02-19 DIAGNOSIS — R41841 Cognitive communication deficit: Secondary | ICD-10-CM

## 2021-02-19 DIAGNOSIS — R278 Other lack of coordination: Secondary | ICD-10-CM

## 2021-02-19 DIAGNOSIS — I69218 Other symptoms and signs involving cognitive functions following other nontraumatic intracranial hemorrhage: Secondary | ICD-10-CM

## 2021-02-19 DIAGNOSIS — M6281 Muscle weakness (generalized): Secondary | ICD-10-CM

## 2021-02-19 DIAGNOSIS — R41842 Visuospatial deficit: Secondary | ICD-10-CM

## 2021-02-19 DIAGNOSIS — R208 Other disturbances of skin sensation: Secondary | ICD-10-CM

## 2021-02-19 DIAGNOSIS — R41844 Frontal lobe and executive function deficit: Secondary | ICD-10-CM

## 2021-02-19 NOTE — Patient Instructions (Signed)
Make one phone call per day and ask for HONEST feedback.

## 2021-02-19 NOTE — Therapy (Signed)
[facility] [address] [place], [place], [address] Phone: [phone]   Fax:  [phone]  Occupational Therapy Treatment  Patient Details  Name: Walter [name] MRN: [id number] Date of Birth: [birth year] Referring Provider (OT): Dr. Alysia Penna   Encounter Date: 02/19/2021   OT End of Session - 02/19/21 1239     Visit Number 14    Number of Visits 84    Date for OT Re-Evaluation 04/27/21    Authorization Type Medicare Part A & B    Authorization Time Period 02/02/21-updated plan for 3 x week x 12 weeks, anticipate d/c after 8 weeks    Authorization - Visit Number 14    Authorization - Number of Visits 20    Progress Note Due on Visit 20    OT Start Time 1233    OT Stop Time 1315    OT Time Calculation (min) 42 min    Activity Tolerance Patient tolerated treatment well    Behavior During Therapy Impulsive             Past Medical History:  Diagnosis Date   Atrial flutter ([place])    s/p ablation   DVT (deep venous thrombosis) ([place])    Near syncope 09/25/2013   Paroxysmal atrial fibrillation ([place])    Stroke ([place])    initially hemorragic (not on [place]) followed by subsequent embolic stroke    Past Surgical History:  Procedure Laterality Date   A-FLUTTER ABLATION N/A 03/09/2020   Procedure: A-FLUTTER ABLATION;  Surgeon: Thompson Grayer, MD;  Location: [facility];  Service: Cardiovascular;  Laterality: N/A;   CARDIOVERSION N/A 09/27/2013   Procedure: CARDIOVERSION;  Surgeon: Lelon Perla, MD;  Location: [facility] ENDOSCOPY;  Service: Cardiovascular;  Laterality: N/A;   IR ANGIO EXTERNAL CAROTID SEL EXT CAROTID UNI R MOD SED  09/26/2020   IR ANGIO INTRA EXTRACRAN SEL COM CAROTID INNOMINATE BILAT MOD SED  09/26/2020   IR ANGIO VERTEBRAL SEL VERTEBRAL UNI R MOD SED  09/26/2020   IR IVC FILTER PLMT / S&I /IMG GUID/MOD SED  10/08/2020   IR IVUS EACH ADDITIONAL NON CORONARY VESSEL  11/02/2020   IR PTA VENOUS EXCEPT  DIALYSIS CIRCUIT  11/02/2020   IR RADIOLOGIST EVAL & MGMT  01/17/2021   IR THROMBECT VENO MECH MOD SED  10/27/2020   IR THROMBECT VENO MECH MOD SED  11/02/2020   IR US GUIDE VASC ACCESS LEFT  10/27/2020   IR US GUIDE VASC ACCESS RIGHT  09/26/2020   IR US GUIDE VASC ACCESS RIGHT  11/02/2020   IR VENO/EXT/UNI RIGHT  11/02/2020   IR VENOCAVAGRAM IVC  10/27/2020   RETINAL DETACHMENT SURGERY     right knee arthroscopy     TEE WITHOUT CARDIOVERSION N/A 09/27/2013   Procedure: TRANSESOPHAGEAL ECHOCARDIOGRAM (TEE);  Surgeon: Lelon Perla, MD;  Location: [facility] ENDOSCOPY;  Service: Cardiovascular;  Laterality: N/A;   WRIST SURGERY      There were no vitals filed for this visit.   Subjective Assessment - 02/19/21 1238     Subjective  [place]    Patient is accompanied by: --   caregiver   Pertinent History ICH of brainstem (complicated by hydrocephalus, DVTs, and tracheostomy).  PMH:  a-fib    Limitations fall risk, ataxia, impulsivity    Patient Stated Goals use L side of body better (decrease ataxia), stay active and heal brain    Pain Onset More than a  month ago               Sitting, mid-high range functional reaching to place various-sized washers on vertical pole for incr coordination with min-mod difficulty.   Completing 12-piece puzzle for visual scanning and problem solving and bilateral hand coordination with incr time/min difficulty.  Standing, folding clothes with CGA for bilateral UE control/coordination.  Sitting, fastening/unfastening buttons along tabletop with mod difficulty/incr time due to visual deficits and bilateral coordination.  Sitting, performing closed-chain shoulder flex with cane to targets on peg ladder for incr UE control/coordination with min cueing/difficulty for slow controlled movements.  Sitting, placing graded clothespins on edge of box in pattern for visual scanning and for graded pinch and coordination with  LUE with min-mod difficulty.         OT Short Term Goals - 02/12/21 1357       OT SHORT TERM GOAL #1   Title Pt/caregiver will be independent with initial HEP for LUE coordination and R hand strength.--    Time 4    Period Weeks    Status Achieved   needs continued reinforcement, pt is not using consistently   Target Date 03/02/21      OT SHORT TERM GOAL #2   Title Pt/caregiver will be independent with vision HEP and visual compensation strategies.    Time 4    Period Weeks    Status On-going   Pt/ family verbalize understanding of vision HEP, needs continued reinforcement of compensatory strategeis- pt is not performing head turns     OT SHORT TERM GOAL #3   Title Pt will improve LUE coordination for ADLs as shown by improving score on box and blocks test by at least 6.    Baseline L-21 blocks    Time 4    Period Weeks    Status On-going   25 blocks     OT SHORT TERM GOAL #4   Title Pt will perform simple snack prep/home maintenance task from w/c level mod I.    Time 4    Period Weeks    Status Achieved   met per pt's wife     OT SHORT TERM GOAL #5   Title Pt will verbalize understanding of compensation strategies for sensory deficts for incr safety.    Time 4    Period Weeks    Status On-going   Pt verbalized that he has to be careful about temperature, needs additional reinforcement     OT SHORT TERM GOAL #6   Title Pt will improve R hand grip stength to at least 32lbs to assist with ADLs/opening containers.    Time 4    Period Weeks    Status Achieved   33.5     OT SHORT TERM GOAL #7   Title --               OT Long Term Goals - 02/02/21 1416       OT LONG TERM GOAL #1   Title Pt will be independent with updated HEP--    Time 12    Period Weeks    Status On-going    Target Date 04/27/21      OT LONG TERM GOAL #2   Title Pt will improve L hand coordination for ADLs as shown by completing 9-hole peg test in less than 70sec.    Baseline 107sec     Time 12    Period Weeks    Status On-going  OT LONG TERM GOAL #3   Title Pt will be mod I with toileting.    Time 12    Period Weeks    Status On-going      OT LONG TERM GOAL #4   Title Pt will perform simple snack prep/home maintenance tasks in standing with supervision.    Time 12    Period Weeks    Status On-going      OT LONG TERM GOAL #5   Title Pt will demo good safety awareness/compensation for sensory deficits and attention to LUE during functional tasks and transfers without cueing.    Time 12    Period Weeks    Status On-going      OT LONG TERM GOAL #6   Title Pt will perform simple environmental scanning/navigation with at least 90% accuracy for incr safety.    Time 12    Period Weeks    Status On-going      OT LONG TERM GOAL #7   Title Pt will perform all basic ADLS with no more than supervision.    Time 12    Period Weeks    Status New                   Plan - 02/19/21 1236     Clinical Impression Statement Pt progressing slowly towards goals with improving LUE control and balance.    OT Occupational Profile and History Detailed Assessment- Review of Records and additional review of physical, cognitive, psychosocial history related to current functional performance    Occupational performance deficits (Please refer to evaluation for details): ADL's;IADL's;Work;Leisure;Social Participation    Body Structure / Function / Physical Skills ADL;Strength;Balance;Proprioception;UE functional use;IADL;Endurance;Vision;Mobility;Coordination;Decreased knowledge of precautions;FMC;GMC;Sensation    Cognitive Skills Attention;Safety Awareness;Memory;Perception;Problem Solve    Rehab Potential Good    Clinical Decision Making Several treatment options, min-mod task modification necessary    Comorbidities Affecting Occupational Performance: May have comorbidities impacting occupational performance    Modification or Assistance to Complete Evaluation   Min-Moderate modification of tasks or assist with assess necessary to complete eval    OT Frequency 3x / week   updated frequency may decrease after 4 weeks   OT Duration 12 weeks   written for 12 weeks however may d/c after 8 depenent upon progress.   OT Treatment/Interventions Self-care/ADL training;Moist Heat;Fluidtherapy;DME and/or AE instruction;Balance training;Therapeutic activities;Aquatic Therapy;Ultrasound;Therapeutic exercise;Cognitive remediation/compensation;Visual/perceptual remediation/compensation;Functional Mobility Training;Neuromuscular education;Cryotherapy;Energy conservation;Manual Therapy;Patient/family education    Plan continue to address goals, functional reaching, standing balance    Consulted and Agree with Plan of Care Family member/caregiver;Patient    Family Member Consulted wife             Patient will benefit from skilled therapeutic intervention in order to improve the following deficits and impairments:   Body Structure / Function / Physical Skills: ADL, Strength, Balance, Proprioception, UE functional use, IADL, Endurance, Vision, Mobility, Coordination, Decreased knowledge of precautions, FMC, GMC, Sensation Cognitive Skills: Attention, Safety Awareness, Memory, Perception, Problem Solve     Visit Diagnosis: Other lack of coordination  Ataxia  Other disturbances of skin sensation  Muscle weakness (generalized)  Visuospatial deficit  Frontal lobe and executive function deficit  Other symptoms and signs involving cognitive functions following other nontraumatic intracranial hemorrhage    Problem List Patient Active Problem List   Diagnosis Date Noted   Thrombosis    Sleep disturbance    Dysphagia, post-stroke    PAF (paroxysmal atrial fibrillation) (H[facility])    Acute pulmonary embolism without  acute cor pulmonale (HCC)    Malnutrition of moderate degree 11/04/2020   Tracheostomy care ([place])    Pressure injury of skin 10/23/2020    Intracranial hemorrhage ([facility])    Atrial fibrillation (H[facility])    Prediabetes    Leukocytosis    Acute blood loss anemia    Hypernatremia    ICH (intracerebral hemorrhage) ([place]) 09/25/2020   Unilateral primary osteoarthritis, left knee 09/26/2016   Atrial flutter ([place]) 09/25/2013   Near syncope 09/25/2013    [facility] 02/19/2021, 1:06 PM  [facility] [address] [place], [place], [address] Phone: [phone]   Fax:  [phone]  Name: Walter [name] MRN: [id number] Date of Birth: 06/01/1956

## 2021-02-19 NOTE — Therapy (Signed)
OUTPATIENT PHYSICAL THERAPY TREATMENT NOTE  Patient Name: Walter [name] MRN: [id number] DOB:Dec 10, 1955, 65 y.o., male Today's Date: 02/19/2021  PCP: Alroy Dust, L.Marlou Sa, MD REFERRING PROVIDER: Charlett Blake, MD   PT End of Session - 02/19/21 1318     Visit Number 14    Number of Visits 17    Date for PT Re-Evaluation 02/20/21    Authorization Type Medicare 12/26/20 to 02/20/21    Progress Note Due on Visit 20    PT Start Time 1315    PT Stop Time 1400    PT Time Calculation (min) 45 min    Behavior During Therapy Impulsive                Past Medical History:  Diagnosis Date   Atrial flutter [facility] ([place]    s/p ablation   DVT (deep venous thrombosis) ([place])    Near syncope 09/25/2013   Paroxysmal atrial fibrillation (HCC)    Stroke [facility])    initially hemorragic (not on [place]) followed by subsequent embolic stroke   Past Surgical History:  Procedure Laterality Date   A-FLUTTER ABLATION N/A 03/09/2020   Procedure: A-FLUTTER ABLATION;  Surgeon: Thompson Grayer, MD;  Location: [place] CV LAB;  Service: Cardiovascular;  Laterality: N/A;   CARDIOVERSION N/A 09/27/2013   Procedure: CARDIOVERSION;  Surgeon: Lelon Perla, MD;  Location: [facility] ENDOSCOPY;  Service: Cardiovascular;  Laterality: N/A;   IR ANGIO EXTERNAL CAROTID SEL EXT CAROTID UNI R MOD SED  09/26/2020   IR ANGIO INTRA EXTRACRAN SEL COM CAROTID INNOMINATE BILAT MOD SED  09/26/2020   IR ANGIO VERTEBRAL SEL VERTEBRAL UNI R MOD SED  09/26/2020   IR IVC FILTER PLMT / S&I /IMG GUID/MOD SED  10/08/2020   IR IVUS EACH ADDITIONAL NON CORONARY VESSEL  11/02/2020   IR PTA VENOUS EXCEPT DIALYSIS CIRCUIT  11/02/2020   IR RADIOLOGIST EVAL & MGMT  01/17/2021   IR THROMBECT VENO MECH MOD SED  10/27/2020   IR THROMBECT VENO MECH MOD SED  11/02/2020   IR US GUIDE VASC ACCESS LEFT  10/27/2020   IR US GUIDE VASC ACCESS RIGHT  09/26/2020   IR US GUIDE VASC ACCESS RIGHT  11/02/2020   IR VENO/EXT/UNI RIGHT  11/02/2020   IR VENOCAVAGRAM IVC   10/27/2020   RETINAL DETACHMENT SURGERY     right knee arthroscopy     TEE WITHOUT CARDIOVERSION N/A 09/27/2013   Procedure: TRANSESOPHAGEAL ECHOCARDIOGRAM (TEE);  Surgeon: Lelon Perla, MD;  Location: [facility] ENDOSCOPY;  Service: Cardiovascular;  Laterality: N/A;   WRIST SURGERY     Patient Active Problem List   Diagnosis Date Noted   Thrombosis    Sleep disturbance    Dysphagia, post-stroke    PAF (paroxysmal atrial fibrillation) ([place])    Acute pulmonary embolism without acute cor pulmonale (HCC)    Malnutrition of moderate degree 11/04/2020   Tracheostomy care ([place])    Pressure injury of skin 10/23/2020   Intracranial hemorrhage (HCC)    Atrial fibrillation (HCC)    Prediabetes    Leukocytosis    Acute blood loss anemia    Hypernatremia    ICH (intracerebral hemorrhage) ([place]) 09/25/2020   Unilateral primary osteoarthritis, left knee 09/26/2016   Atrial flutter ([place]) 09/25/2013   Near syncope 09/25/2013    REFERRING DIAG: I61.3 (ICD-10-CM) - 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 ([place]) I69.193 (ICD-10-CM) - 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  PERTINENT HISTORY: atrial flutter s/p  ablation, DVT, near syncope, CVA  PRECAUTIONS: fall  SUBJECTIVE: Was able to attend 2 birthday parties in [place] this weekend without incident  Objective:   TODAY'S TREATMENT:  02/19/21 Sit to stand 10x encouraging midline with OH lift to encourage posture Gait training: 115 feet with RW, cues for standing up tall, keeping in middle of walker, focus on step length and foot placement, tendency speed up and lose control but able to correct cadence when cued, 3# wt to LLE helped with control Seated OH lift with ball to facilitate posture Scifit seat 22, arms 8, L 3.0 x8 min. Standing in walker, tapping 2 floor targets unilateral and then alternating 10x ea. Leg requiring MinA for stability Standing in walker, steeping and WS  onto 4" block, unilateral advancing to alternating.    02/16/21: Sit to stand 5x encouraging midline Gait training: 230 feet with RW, cues for standing up tall, keeping in middle of walker, focus on step length and foot placement, tendency speed up and lose control but able to correct cadence when cued Standing in // bars, stepping to 4" block, 10x alternating pattern, anterior and lateral stepping with weight shifting, cued for postural correction. Static standing on Airex in // bars, 3x 60s with diminishing levels of CGA and no UE support Forward, retro and sidestepping in // bars, BUE support, 5 trips Vcs to shorten steps and control foot placement   02/12/2021:  Transfers: sit > stand from transport wheelchair with RW and cues to bring chest forward in midline for controlled, equal WB during sit > stand.  Pt consistently performed sit <> stand with min A.  Stand pivot to mat with RW and min A and max verbal cues to sequence pivot.  Pt required cues intermittently to keep close to RW when pivoting for safety.  NMR with physioball in sitting: LUE on ball on L side performed lateral weight shifts to L pushing ball and elongating trunk on L side x 10 reps, therapist provided cues for head righting back to midline.  Changed and put ball in front of pt.: placed bilat UE on ball and performed leaning forwards pushing ball out in midline keeping head upright and trunk upright to weight shift over BOS x 5 reps, changed to weight shifting forwards and coming to a squat in midline x 5 reps.  Progressed to 5 reps coming to a squat and lifting RUE off ball for increased weight shift to L x 5 second hold.  Gait: Side stepping with RW to L and R x 15' with min A and verbal cues to sequence; discussed how pt could use side stepping to negotiate through narrow spaces/doorways instead of trying to fit through with RW going forwards.  Performed walking in hallway with LUE HHA and RUE reaching up and to the R - cues to  keep RUE high on wall to facilitate increased weight shift to R and proprioceptive feedback to decrease pt veering too far to L when ambulating.  Pt required mod A to perform x 4 reps.     PATIENT EDUCATION: Education details: Side stepping with RW through narrow spaces Person educated: Patient and caregiver Education method: Explanation, Demonstration, and Verbal cues Education comprehension: verbalized understanding     HOME EXERCISE PROGRAM: Continue daily walking with CG assist  ASSESSMENT:   CLINICAL IMPRESSION: Continued gait and balance training emphasizing pacing, proper step length and cadence as well as good safety awareness.  Added 3# wt to L foot to help control ataxia  which helped.  Began aerobic training on Scifit. Continues to require cuing for safe transfer and especially hand placement.  REHAB POTENTIAL: Good   CLINICAL DECISION MAKING: Stable/uncomplicated  GOALS: Goals reviewed with patient? Yes   SHORT TERM GOALS:   STG Name Target Date Goal status  1 Patient will be able to perform chair to chair transfer with CGA/SBA with use of RW for safety to improve independence. Baseline: min to mod A (12/26/20) 01/23/2021 02/05/21 Able to transfer STS with SBA  2 Pt will be able to ambulate 115' with RW with CGA to min A with RW for safety to improve independence Baseline: min to mod A with RW 145' 01/23/2021 02/05/21 Ambulation of 123fx2 with RW and CGA/minA   3 Pt will be able to maintain standing balance for 30 sec to improve static balance Baseline: 10 sec before needing help due to LOB (12/26/20) 01/23/2021 02/05/21 able to stand with S for 30s, goal met    LONG TERM GOALS:   LTG Name Target Date Goal status  1 Pt will demo >5 points improvement on Berg Balance Scale to improve static balance Baseline: 11/56 01/03/21 02/20/2021 INITIAL  2 Pt will be able to ambulate >500 feet with RW and SBA to improve walking endurance and safety. Baseline: 145' with RW min to mod A  02/20/2021 INITIAL  3 Pt will be able to perform gait speed improvement by 0.171m to improve community ambulation. Baseline:TBD 02/20/2021 INITIAL  4 Pt will demo 5 sec improvement in 5x sit to stand (with or without UE support) to improve functional strength Baseline: TBD 02/20/2021 INITIAL    PLAN: PT FREQUENCY: 2x/week   PT DURATION: 8 weeks   PLANNED INTERVENTIONS: Therapeutic exercises, Therapeutic activity, Neuro Muscular re-education, Balance training, Gait training, Patient/Family education, Joint mobilization, Stair training, Visual/preceptual remediation/compensation, Wheelchair mobility training, Cryotherapy, Moist heat, and Manual therapy   PLAN FOR NEXT SESSION: Standing/walking tasks in // bars, floor targets and stepping tasks, higher level gait training-stepping over and around obstacles, head and trunk righting, STGs check    Visit Diagnosis: No diagnosis found.     Problem List Patient Active Problem List   Diagnosis Date Noted   Thrombosis    Sleep disturbance    Dysphagia, post-stroke    PAF (paroxysmal atrial fibrillation) ([place]   Acute pulmonary embolism without acute cor pulmonale (HCC)    Malnutrition of moderate degree 11/04/2020   [place]ache[place] care ([place]   Pressure injury of skin 10/23/2020   Intracranial hemorrhage (HCC)    Atrial fibrillation ([place]   Prediabetes    Leukocytosis    Acute blood loss anemia    Hypernatremia    ICH (intracerebral hemorrhage) ([place]03/28/2022   Unilateral primary osteoarthritis, left knee 09/26/2016   Atrial flutter ([place]03/28/2015   Near syncope 09/25/2013     JeLeroy SeaT 02/19/21    1:24 PM    [address]uPueblito [address] [id number]: [phone] Fax:  [phone]: StMYSON LEVIRN: [id number] of Birth: 08/1955/03/13

## 2021-02-19 NOTE — Therapy (Signed)
[facility] [address] [place], [place], [address] Phone: [phone]   Fax:  [phone]  Speech Language Pathology Treatment  Patient Details  Name: Walter [name] MRN: [id number] Date of Birth: [birth year] Referring Provider (SLP): Charlett Blake, MD   Encounter Date: 02/19/2021   End of Session - 02/19/21 1714     Visit Number 16    Number of Visits 17    Date for SLP Re-Evaluation 02/21/21    Authorization Type Medicare    SLP Start Time 1404    SLP Stop Time  9211    SLP Time Calculation (min) 41 min    Activity Tolerance Patient tolerated treatment well             Past Medical History:  Diagnosis Date   Atrial flutter ([place])    s/p ablation   DVT (deep venous thrombosis) ([place])    Near syncope 09/25/2013   Paroxysmal atrial fibrillation ([place])    Stroke [facility])    initially hemorragic (not on [facility]) followed by subsequent embolic stroke    Past Surgical History:  Procedure Laterality Date   A-FLUTTER ABLATION N/A 03/09/2020   Procedure: A-FLUTTER ABLATION;  Surgeon: Thompson Grayer, MD;  Location: [place] CV LAB;  Service: Cardiovascular;  Laterality: N/A;   CARDIOVERSION N/A 09/27/2013   Procedure: CARDIOVERSION;  Surgeon: Lelon Perla, MD;  Location: [facility] ENDOSCOPY;  Service: Cardiovascular;  Laterality: N/A;   IR ANGIO EXTERNAL CAROTID SEL EXT CAROTID UNI R MOD SED  09/26/2020   IR ANGIO INTRA EXTRACRAN SEL COM CAROTID INNOMINATE BILAT MOD SED  09/26/2020   IR ANGIO VERTEBRAL SEL VERTEBRAL UNI R MOD SED  09/26/2020   IR IVC FILTER PLMT / S&I /IMG GUID/MOD SED  10/08/2020   IR IVUS EACH ADDITIONAL NON CORONARY VESSEL  11/02/2020   IR PTA VENOUS EXCEPT DIALYSIS CIRCUIT  11/02/2020   IR RADIOLOGIST EVAL & MGMT  01/17/2021   IR THROMBECT VENO MECH MOD SED  10/27/2020   IR THROMBECT VENO MECH MOD SED  11/02/2020   IR US GUIDE VASC ACCESS LEFT  10/27/2020   IR US GUIDE VASC ACCESS RIGHT  09/26/2020   IR US  GUIDE VASC ACCESS RIGHT  11/02/2020   IR VENO/EXT/UNI RIGHT  11/02/2020   IR VENOCAVAGRAM IVC  10/27/2020   RETINAL DETACHMENT SURGERY     right knee arthroscopy     TEE WITHOUT CARDIOVERSION N/A 09/27/2013   Procedure: TRANSESOPHAGEAL ECHOCARDIOGRAM (TEE);  Surgeon: Lelon Perla, MD;  Location: [facility] ENDOSCOPY;  Service: Cardiovascular;  Laterality: N/A;   WRIST SURGERY      There were no vitals filed for this visit.   Subjective Assessment - 02/19/21 1410     Subjective "Went to [place] for two birthdays."    Patient is accompained by: Claiborne Billings - caregiver   Currently in Pain? No/denies                   ADULT SLP TREATMENT - 02/19/21 1411       General Information   Behavior/Cognition Alert;Cooperative;Pleasant mood;Requires cueing      Treatment Provided   Treatment provided Cognitive-Linquistic      Cognitive-Linquistic Treatment   Treatment focused on Cognition;Dysarthria;Patient/family/caregiver education    Skilled Treatment My wife insisted I do (loud /a/) in teh car on the way to [place] ("It took the place of our horn."). Loud /a/ average mid 80s today, and sentences were average in upper  70s/low 80s dB. Today pt described pictures (detailed) with WNL volume and slow/intelligible rate 88% of the time- pt was intelligible in context. We called pt's daughter who stated she has had difficulty understanding pt on phone and she understood 100% of pt's speech via telephone with pic description. In conversation, pt appeared to use abdominal breathing (AB) >50% of the time.      Assessment / Recommendations / Plan   Plan Continue with current plan of care      Progression Toward Goals   Progression toward goals Progressing toward goals                SLP Short Term Goals - 02/19/21 1716       SLP SHORT TERM GOAL #1   Title Pt will complete dysarthria HEP with occasional min A over 2 sessions    Status Not Met      SLP SHORT TERM GOAL #2   Title Pt  will produce loud /a/ or "hey!" with at least high 80s dB average over 2 sessions    Status Not Met      SLP SHORT TERM GOAL #3   Title Pt will generate abdominal breathing in 16/20 answers to SLP questions over 2 sessions    Baseline 01-18-21    Status Partially Met      SLP SHORT TERM GOAL #4   Title Pt will demonstrate dysarthria compensations in 10 minute simple to mod complex conversation to be 75% intelligibile with occasional min A over 2 sessions    Status Not Met      SLP SHORT TERM GOAL #5   Title Pt will complete formal cognitive linguistic assessment in first 1-2 ST sessions    Status Achieved      SLP SHORT TERM GOAL #6   Title Pt will use memory and attention compensations for appointments, medicine management, and other daily activities with occasional min A over 2 sessions    Status Not Met              SLP Long Term Goals - 02/19/21 1716       SLP LONG TERM GOAL #1   Title Pt will complete dysarthria HEP with rare min A over 2 sessions    Time 1    Period Weeks    Status On-going      SLP LONG TERM GOAL #2   Title Pt will demonstrate dysarthria compensations in 5 minute simple conversation to be 90% intelligibile with rare min A over 2 sessions    Baseline 02-19-21    Time 1    Period Weeks    Status On-going      SLP LONG TERM GOAL #3   Title Pt will demo improved sustained and selective attention during therapeutic tasks with less than 3 cues for active listening during 2 sessions    Time 1    Period Weeks    Status Deferred      SLP LONG TERM GOAL #4   Title Pt will demonstrate emergent and anticipatory awareness of impulsivity with less than 5 cues to modify behaviors during 2 sessions    Time 2    Period Weeks    Status On-going      SLP LONG TERM GOAL #5   Title Pt will report reduced frustration and improved communication effectiveness via PROM by 2 points at last ST sessions    Baseline CES: 15.5 & V-RQOL:35    Time 2  Period Weeks     Status On-going              Plan - 02/19/21 1715     Clinical Impression Statement "[name]" presents for OPST intervention secondary to stroke in March 2022. Pt worked with SLP continuing in training pt in breath support and compensations for audible, intelligible speech.Pt presents wtih improving accuracy on structured speech tasks with slight improvement in carryover at conversational level noted this session. Skilled ST is cont'd warranted to address dysarthria and cognition to maximize communication effectiveness and optimize functional independence and safety at home.    Speech Therapy Frequency 3x / week    Duration 8 weeks    Treatment/Interventions Cueing hierarchy;Functional tasks;Patient/family education;Cognitive reorganization;Multimodal communcation approach;Language facilitation;Compensatory techniques;Internal/external aids;SLP instruction and feedback    Potential to Achieve Goals Fair    Potential Considerations Ability to learn/carryover information;Cooperation/participation level    SLP Home Exercise Plan provided    Consulted and Agree with Plan of Care Patient;Family member/caregiver             Patient will benefit from skilled therapeutic intervention in order to improve the following deficits and impairments:   Dysarthria and anarthria  Cognitive communication deficit    Problem List Patient Active Problem List   Diagnosis Date Noted   Thrombosis    Sleep disturbance    Dysphagia, post-stroke    PAF (paroxysmal atrial fibrillation) ([place])    Acute pulmonary embolism without acute cor pulmonale (HCC)    Malnutrition of moderate degree 11/04/2020   Tracheostomy care ([place])    Pressure injury of skin 10/23/2020   Intracranial hemorrhage (HCC)    Atrial fibrillation ([place])    Prediabetes    Leukocytosis    Acute blood loss anemia    Hypernatremia    ICH (intracerebral hemorrhage) ([place]) 09/25/2020   Unilateral primary osteoarthritis, left knee  09/26/2016   Atrial flutter ([place]) 09/25/2013   Near syncope 09/25/2013    [facility] ,[place], [place]  02/19/2021, 5:17 PM  [facility] [address] [place], [place], [address] Phone: [phone]   Fax:  [phone]   Name: FABION GATSON MRN: [id number] Date of Birth: 21-May-1956

## 2021-02-21 ENCOUNTER — Ambulatory Visit: Payer: Medicare Other

## 2021-02-21 ENCOUNTER — Ambulatory Visit: Payer: Medicare Other | Admitting: Occupational Therapy

## 2021-02-21 ENCOUNTER — Other Ambulatory Visit: Payer: Self-pay

## 2021-02-21 ENCOUNTER — Ambulatory Visit: Payer: Medicare Other | Admitting: Speech Pathology

## 2021-02-21 ENCOUNTER — Encounter: Payer: Self-pay | Admitting: Speech Pathology

## 2021-02-21 DIAGNOSIS — R260 Ataxic gait: Secondary | ICD-10-CM

## 2021-02-21 DIAGNOSIS — R471 Dysarthria and anarthria: Secondary | ICD-10-CM

## 2021-02-21 DIAGNOSIS — M6281 Muscle weakness (generalized): Secondary | ICD-10-CM

## 2021-02-21 DIAGNOSIS — R41842 Visuospatial deficit: Secondary | ICD-10-CM

## 2021-02-21 DIAGNOSIS — R41844 Frontal lobe and executive function deficit: Secondary | ICD-10-CM

## 2021-02-21 DIAGNOSIS — R41841 Cognitive communication deficit: Secondary | ICD-10-CM

## 2021-02-21 DIAGNOSIS — I69218 Other symptoms and signs involving cognitive functions following other nontraumatic intracranial hemorrhage: Secondary | ICD-10-CM

## 2021-02-21 DIAGNOSIS — R2689 Other abnormalities of gait and mobility: Secondary | ICD-10-CM

## 2021-02-21 DIAGNOSIS — I639 Cerebral infarction, unspecified: Secondary | ICD-10-CM

## 2021-02-21 DIAGNOSIS — R208 Other disturbances of skin sensation: Secondary | ICD-10-CM

## 2021-02-21 DIAGNOSIS — R278 Other lack of coordination: Secondary | ICD-10-CM

## 2021-02-21 DIAGNOSIS — R270 Ataxia, unspecified: Secondary | ICD-10-CM

## 2021-02-21 DIAGNOSIS — R2681 Unsteadiness on feet: Secondary | ICD-10-CM

## 2021-02-21 NOTE — Therapy (Signed)
[facility] [address] [place], [place], [address] Phone: [phone]   Fax:  [phone]  Occupational Therapy Treatment  Patient Details  Name: Walter [name] MRN: [id number] Date of Birth: [birth year] Referring Provider (OT): Dr. Alysia Penna   Encounter Date: 02/21/2021   OT End of Session - 02/21/21 1325     Visit Number 15    Number of Visits 46    Date for OT Re-Evaluation 04/27/21    Authorization Type Medicare Part A & B    Authorization Time Period 14    Authorization - Number of Visits 20    Progress Note Due on Visit 52    OT Start Time 1317    OT Stop Time 1400    OT Time Calculation (min) 43 min             Past Medical History:  Diagnosis Date   Atrial flutter ([place])    s/p ablation   DVT (deep venous thrombosis) ([place])    Near syncope 09/25/2013   Paroxysmal atrial fibrillation ([place])    Stroke [facility])    initially hemorragic (not on [facility]) followed by subsequent embolic stroke    Past Surgical History:  Procedure Laterality Date   A-FLUTTER ABLATION N/A 03/09/2020   Procedure: A-FLUTTER ABLATION;  Surgeon: Thompson Grayer, MD;  Location: [facility] CV LAB;  Service: Cardiovascular;  Laterality: N/A;   CARDIOVERSION N/A 09/27/2013   Procedure: CARDIOVERSION;  Surgeon: Lelon Perla, MD;  Location: [facility] ENDOSCOPY;  Service: Cardiovascular;  Laterality: N/A;   IR ANGIO EXTERNAL CAROTID SEL EXT CAROTID UNI R MOD SED  09/26/2020   IR ANGIO INTRA EXTRACRAN SEL COM CAROTID INNOMINATE BILAT MOD SED  09/26/2020   IR ANGIO VERTEBRAL SEL VERTEBRAL UNI R MOD SED  09/26/2020   IR IVC FILTER PLMT / S&I /IMG GUID/MOD SED  10/08/2020   IR IVUS EACH ADDITIONAL NON CORONARY VESSEL  11/02/2020   IR PTA VENOUS EXCEPT DIALYSIS CIRCUIT  11/02/2020   IR RADIOLOGIST EVAL & MGMT  01/17/2021   IR THROMBECT VENO MECH MOD SED  10/27/2020   IR THROMBECT VENO MECH MOD SED  11/02/2020   IR US GUIDE VASC ACCESS LEFT  10/27/2020    IR US GUIDE VASC ACCESS RIGHT  09/26/2020   IR US GUIDE VASC ACCESS RIGHT  11/02/2020   IR VENO/EXT/UNI RIGHT  11/02/2020   IR VENOCAVAGRAM IVC  10/27/2020   RETINAL DETACHMENT SURGERY     right knee arthroscopy     TEE WITHOUT CARDIOVERSION N/A 09/27/2013   Procedure: TRANSESOPHAGEAL ECHOCARDIOGRAM (TEE);  Surgeon: Lelon Perla, MD;  Location: [facility] ENDOSCOPY;  Service: Cardiovascular;  Laterality: N/A;   WRIST SURGERY      There were no vitals filed for this visit.    Pt denies pain today  Treatment:Stacking blocks with left and right UE's alternately, then unstacking to place in container for increased control. Standing at tabletop weightbearing through bilateral UE's rocking forwards and backwards with mod facilitation/ v.c Standing at tabletop to flip large playing cards with left and right UE's, min v.c minguard for balance. Writing activity, pt wrote several sentences with better than 90% legibility and min v.c to slow down. Placing large to small pegs in semicircle with left and right UE's for increased coordination, min difficulty v.c for performance                        OT Short Term Goals -  02/12/21 1357       OT SHORT TERM GOAL #1   Title Pt/caregiver will be independent with initial HEP for LUE coordination and R hand strength.--    Time 4    Period Weeks    Status Achieved   needs continued reinforcement, pt is not using consistently   Target Date 03/02/21      OT SHORT TERM GOAL #2   Title Pt/caregiver will be independent with vision HEP and visual compensation strategies.    Time 4    Period Weeks    Status On-going   Pt/ family verbalize understanding of vision HEP, needs continued reinforcement of compensatory strategeis- pt is not performing head turns     OT SHORT TERM GOAL #3   Title Pt will improve LUE coordination for ADLs as shown by improving score on box and blocks test by at least 6.    Baseline L-21 blocks    Time 4    Period  Weeks    Status On-going   25 blocks     OT SHORT TERM GOAL #4   Title Pt will perform simple snack prep/home maintenance task from w/c level mod I.    Time 4    Period Weeks    Status Achieved   met per pt's wife     OT SHORT TERM GOAL #5   Title Pt will verbalize understanding of compensation strategies for sensory deficts for incr safety.    Time 4    Period Weeks    Status On-going   Pt verbalized that he has to be careful about temperature, needs additional reinforcement     OT SHORT TERM GOAL #6   Title Pt will improve R hand grip stength to at least 32lbs to assist with ADLs/opening containers.    Time 4    Period Weeks    Status Achieved   33.5     OT SHORT TERM GOAL #7   Title --               OT Long Term Goals - 02/02/21 1416       OT LONG TERM GOAL #1   Title Pt will be independent with updated HEP--    Time 12    Period Weeks    Status On-going    Target Date 04/27/21      OT LONG TERM GOAL #2   Title Pt will improve L hand coordination for ADLs as shown by completing 9-hole peg test in less than 70sec.    Baseline 107sec    Time 12    Period Weeks    Status On-going      OT LONG TERM GOAL #3   Title Pt will be mod I with toileting.    Time 12    Period Weeks    Status On-going      OT LONG TERM GOAL #4   Title Pt will perform simple snack prep/home maintenance tasks in standing with supervision.    Time 12    Period Weeks    Status On-going      OT LONG TERM GOAL #5   Title Pt will demo good safety awareness/compensation for sensory deficits and attention to LUE during functional tasks and transfers without cueing.    Time 12    Period Weeks    Status On-going      OT LONG TERM GOAL #6   Title Pt will perform simple environmental scanning/navigation with at least 90%  accuracy for incr safety.    Time 12    Period Weeks    Status On-going      OT LONG TERM GOAL #7   Title Pt will perform all basic ADLS with no more than  supervision.    Time 12    Period Weeks    Status New                    Patient will benefit from skilled therapeutic intervention in order to improve the following deficits and impairments:           Visit Diagnosis: Muscle weakness (generalized)  Other lack of coordination  Other disturbances of skin sensation  Visuospatial deficit  Frontal lobe and executive function deficit    Problem List Patient Active Problem List   Diagnosis Date Noted   Thrombosis    Sleep disturbance    Dysphagia, post-stroke    PAF (paroxysmal atrial fibrillation) (HCC)    Acute pulmonary embolism without acute cor pulmonale (HCC)    Malnutrition of moderate degree 11/04/2020   Tracheostomy care ([place])    Pressure injury of skin 10/23/2020   Intracranial hemorrhage (HCC)    Atrial fibrillation ([place])    Prediabetes    Leukocytosis    Acute blood loss anemia    Hypernatremia    ICH (intracerebral hemorrhage) ([place]) 09/25/2020   Unilateral primary osteoarthritis, left knee 09/26/2016   Atrial flutter ([place]) 09/25/2013   Near syncope 09/25/2013    Yogesh Cominsky 02/21/2021, 1:27 PM  [facility] [address] [place], [place], [address] Phone: [phone]   Fax:  [phone]  Name: MAMADOU BREON MRN: [id number] Date of Birth: 29-Aug-1955

## 2021-02-21 NOTE — Therapy (Signed)
OUTPATIENT PHYSICAL THERAPY TREATMENT NOTE  Patient Name: Walter [name] MRN: [id number] DOB:1955/11/18, 65 y.o., male Today's Date: 02/21/2021  PCP: Alroy Dust, L.Marlou Sa, MD REFERRING PROVIDER: Alroy Dust, Carlean Jews.Marlou Sa, MD   PT End of Session - 02/21/21 1243     Visit Number 15    Number of Visits 17    Date for PT Re-Evaluation 02/20/21    Authorization Type Medicare 12/26/20 to 02/20/21    Progress Note Due on Visit 12    PT Start Time 64    PT Stop Time 1315    PT Time Calculation (min) 45 min    Behavior During Therapy Impulsive                Past Medical History:  Diagnosis Date   Atrial flutter ([place])    s/p ablation   DVT (deep venous thrombosis) ([facility])    Near syncope 09/25/2013   Paroxysmal atrial fibrillation (H[facility])    Stroke [facility])    initially hemorragic (not on [place]) followed by subsequent embolic stroke   Past Surgical History:  Procedure Laterality Date   A-FLUTTER ABLATION N/A 03/09/2020   Procedure: A-FLUTTER ABLATION;  Surgeon: Thompson Grayer, MD;  Location: [facility] CV LAB;  Service: Cardiovascular;  Laterality: N/A;   CARDIOVERSION N/A 09/27/2013   Procedure: CARDIOVERSION;  Surgeon: Lelon Perla, MD;  Location: [facility] ENDOSCOPY;  Service: Cardiovascular;  Laterality: N/A;   IR ANGIO EXTERNAL CAROTID SEL EXT CAROTID UNI R MOD SED  09/26/2020   IR ANGIO INTRA EXTRACRAN SEL COM CAROTID INNOMINATE BILAT MOD SED  09/26/2020   IR ANGIO VERTEBRAL SEL VERTEBRAL UNI R MOD SED  09/26/2020   IR IVC FILTER PLMT / S&I /IMG GUID/MOD SED  10/08/2020   IR IVUS EACH ADDITIONAL NON CORONARY VESSEL  11/02/2020   IR PTA VENOUS EXCEPT DIALYSIS CIRCUIT  11/02/2020   IR RADIOLOGIST EVAL & MGMT  01/17/2021   IR THROMBECT VENO MECH MOD SED  10/27/2020   IR THROMBECT VENO MECH MOD SED  11/02/2020   IR US GUIDE VASC ACCESS LEFT  10/27/2020   IR US GUIDE VASC ACCESS RIGHT  09/26/2020   IR US GUIDE VASC ACCESS RIGHT  11/02/2020   IR VENO/EXT/UNI RIGHT  11/02/2020   IR VENOCAVAGRAM IVC  10/27/2020    RETINAL DETACHMENT SURGERY     right knee arthroscopy     TEE WITHOUT CARDIOVERSION N/A 09/27/2013   Procedure: TRANSESOPHAGEAL ECHOCARDIOGRAM (TEE);  Surgeon: Lelon Perla, MD;  Location: [facility] ENDOSCOPY;  Service: Cardiovascular;  Laterality: N/A;   WRIST SURGERY     Patient Active Problem List   Diagnosis Date Noted   Thrombosis    Sleep disturbance    Dysphagia, post-stroke    PAF (paroxysmal atrial fibrillation) ([place])    Acute pulmonary embolism without acute cor pulmonale (HCC)    Malnutrition of moderate degree 11/04/2020   Tracheostomy care ([place])    Pressure injury of skin 10/23/2020   Intracranial hemorrhage (HCC)    Atrial fibrillation (HCC)    Prediabetes    Leukocytosis    Acute blood loss anemia    Hypernatremia    ICH (intracerebral hemorrhage) ([place]) 09/25/2020   Unilateral primary osteoarthritis, left knee 09/26/2016   Atrial flutter ([place]) 09/25/2013   Near syncope 09/25/2013    REFERRING DIAG: I61.3 (ICD-10-CM) - 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 ([place]) I69.193 (ICD-10-CM) - 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  Objective:   TODAY'S TREATMENT:  02/21/21: Scifit: manual level 5 for 10', cues to slow down through out to improve pacing Cues required for safe transfer to make sure he fully turns around before sitting and then backs up to chair/mat table and reaches with arms to arm rest before sitting down. Requires cues for safe hand placement prior to standing up safely Seated horizontal head turns: 20x Standing horizontal head turns: 20x Standing OH reach with cues to look up to follow hands: bil:  10x Standing toe touching: 10x, cues to not sway back when standing erect to prevent posterior LOB Standing head/body turns to look behind: 10x R and L Functional movement: pt stands up using UE, stabilizes, takes one step fwd, one step bwd, reahes for chair and sit down: cues for slowing down, controlling each movement: 5x, mod A required as patient unable to control step length with L leg due to coordination issues from ataxia, cues to improve vidual contact to his feet to improve coordination with stepping  Prone on elbows and knees: hip extensions: alternating: 10x R and L    02/19/21 Sit to stand 10x encouraging midline with OH lift to encourage posture Gait training: 115 feet with RW, cues for standing up tall, keeping in middle of walker, focus on step length and foot placement, tendency speed up and lose control but able to correct cadence when cued, 3# wt to LLE helped with control Seated OH lift with ball to facilitate posture Scifit seat 22, arms 8, L 3.0 x8 min. Standing in walker, tapping 2 floor targets unilateral and then alternating 10x ea. Leg requiring MinA for stability Standing in walker, steeping and WS onto 4" block, unilateral advancing to alternating.    02/16/21: Sit to stand 5x encouraging midline Gait training: 230 feet with RW, cues for standing up tall, keeping in middle of walker, focus on step length and foot placement, tendency speed up and lose control but able to correct cadence when cued Standing in // bars, stepping to 4" block, 10x alternating pattern, anterior and lateral stepping with weight shifting, cued for postural correction. Static standing on Airex in // bars, 3x 60s with diminishing levels of CGA and no UE support Forward, retro and sidestepping in // bars, BUE support, 5 trips Vcs to shorten steps and control foot placement   02/12/2021:  Transfers: sit > stand from transport wheelchair with RW and cues to bring chest forward in midline for  controlled, equal WB during sit > stand.  Pt consistently performed sit <> stand with min A.  Stand pivot to mat with RW and min A and max verbal cues to sequence pivot.  Pt required cues intermittently to keep close to RW when pivoting for safety.  NMR with physioball in sitting: LUE on ball on L side performed lateral weight shifts to L pushing ball and elongating trunk on L side x 10 reps, therapist provided cues for head righting back to midline.  Changed and put ball in front of pt.: placed bilat UE on ball and performed leaning forwards pushing ball out in midline keeping head upright and trunk upright to weight shift over BOS x 5 reps, changed to weight shifting forwards and coming to a squat in midline x 5 reps.  Progressed to  5 reps coming to a squat and lifting RUE off ball for increased weight shift to L x 5 second hold.  Gait: Side stepping with RW to L and R x 15' with min A and verbal cues to sequence; discussed how pt could use side stepping to negotiate through narrow spaces/doorways instead of trying to fit through with RW going forwards.  Performed walking in hallway with LUE HHA and RUE reaching up and to the R - cues to keep RUE high on wall to facilitate increased weight shift to R and proprioceptive feedback to decrease pt veering too far to L when ambulating.  Pt required mod A to perform x 4 reps.     PATIENT EDUCATION: Education details: Side stepping with RW through narrow spaces Person educated: Patient and caregiver Education method: Explanation, Demonstration, and Verbal cues Education comprehension: verbalized understanding     HOME EXERCISE PROGRAM: Continue daily walking with CG assist  ASSESSMENT:   CLINICAL IMPRESSION: Pt continues to need verbal and tactile cues for safe tranfers. Due to lack of coordination in L LE, pt requires mod a with balance and gait without AD.  REHAB POTENTIAL: Good   CLINICAL DECISION MAKING: Stable/uncomplicated  GOALS: Goals  reviewed with patient? Yes   SHORT TERM GOALS:   STG Name Target Date Goal status  1 Patient will be able to perform chair to chair transfer with CGA/SBA with use of RW for safety to improve independence. Baseline: min to mod A (12/26/20) 01/23/2021 02/05/21 Able to transfer STS with SBA  2 Pt will be able to ambulate 115' with RW with CGA to min A with RW for safety to improve independence Baseline: min to mod A with RW 145' 01/23/2021 02/05/21 Ambulation of 139fx2 with RW and CGA/minA   3 Pt will be able to maintain standing balance for 30 sec to improve static balance Baseline: 10 sec before needing help due to LOB (12/26/20) 01/23/2021 02/05/21 able to stand with S for 30s, goal met    LONG TERM GOALS:   LTG Name Target Date Goal status  1 Pt will demo >5 points improvement on Berg Balance Scale to improve static balance Baseline: 11/56 01/03/21 02/20/2021 INITIAL  2 Pt will be able to ambulate >500 feet with RW and SBA to improve walking endurance and safety. Baseline: 145' with RW min to mod A 02/20/2021 INITIAL  3 Pt will be able to perform gait speed improvement by 0.186m to improve community ambulation. Baseline:TBD 02/20/2021 INITIAL  4 Pt will demo 5 sec improvement in 5x sit to stand (with or without UE support) to improve functional strength Baseline: TBD 02/20/2021 INITIAL    PLAN: PT FREQUENCY: 2x/week   PT DURATION: 8 weeks   PLANNED INTERVENTIONS: Therapeutic exercises, Therapeutic activity, Neuro Muscular re-education, Balance training, Gait training, Patient/Family education, Joint mobilization, Stair training, Visual/preceptual remediation/compensation, Wheelchair mobility training, Cryotherapy, Moist heat, and Manual therapy   PLAN FOR NEXT SESSION: Standing/walking tasks in // bars, floor targets and stepping tasks, higher level gait training-stepping over and around obstacles, head and trunk righting, STGs check    Visit Diagnosis: Muscle weakness (generalized)  Ataxic  gait  Unsteadiness on feet  Other abnormalities of gait and mobility  Cerebrovascular accident (CVA), unspecified mechanism ([place]    Problem List Patient Active Problem List   Diagnosis Date Noted   Thrombosis    Sleep disturbance    Dysphagia, post-stroke    PAF (paroxysmal atrial fibrillation) (HCC)    Acute pulmonary  embolism without acute cor pulmonale (HCC)    Malnutrition of moderate degree 11/04/2020   Tracheostomy care ([place])    Pressure injury of skin 10/23/2020   Intracranial hemorrhage (HCC)    Atrial fibrillation (HCC)    Prediabetes    Leukocytosis    Acute blood loss anemia    Hypernatremia    ICH (intracerebral hemorrhage) ([place]) 09/25/2020   Unilateral primary osteoarthritis, left knee 09/26/2016   Atrial flutter ([place]) 09/25/2013   Near syncope 09/25/2013     Markus Jarvis, PT 02/21/21    12:45 PM    [facility] [address] [place], [place], [address] Phone: [phone]   Fax:  [phone]  Name: Walter [name] MRN: [id number] Date of Birth: 1955/12/23

## 2021-02-22 ENCOUNTER — Ambulatory Visit: Payer: Medicare Other

## 2021-02-22 ENCOUNTER — Ambulatory Visit: Payer: Medicare Other | Admitting: Occupational Therapy

## 2021-02-22 DIAGNOSIS — I69218 Other symptoms and signs involving cognitive functions following other nontraumatic intracranial hemorrhage: Secondary | ICD-10-CM

## 2021-02-22 DIAGNOSIS — M6281 Muscle weakness (generalized): Secondary | ICD-10-CM

## 2021-02-22 DIAGNOSIS — R260 Ataxic gait: Secondary | ICD-10-CM

## 2021-02-22 DIAGNOSIS — R471 Dysarthria and anarthria: Secondary | ICD-10-CM

## 2021-02-22 DIAGNOSIS — R270 Ataxia, unspecified: Secondary | ICD-10-CM

## 2021-02-22 DIAGNOSIS — R41841 Cognitive communication deficit: Secondary | ICD-10-CM

## 2021-02-22 DIAGNOSIS — R208 Other disturbances of skin sensation: Secondary | ICD-10-CM

## 2021-02-22 DIAGNOSIS — R41844 Frontal lobe and executive function deficit: Secondary | ICD-10-CM

## 2021-02-22 DIAGNOSIS — R41842 Visuospatial deficit: Secondary | ICD-10-CM

## 2021-02-22 DIAGNOSIS — I639 Cerebral infarction, unspecified: Secondary | ICD-10-CM

## 2021-02-22 DIAGNOSIS — R278 Other lack of coordination: Secondary | ICD-10-CM

## 2021-02-22 DIAGNOSIS — R2681 Unsteadiness on feet: Secondary | ICD-10-CM

## 2021-02-22 NOTE — Therapy (Signed)
OUTPATIENT PHYSICAL THERAPY TREATMENT NOTE  Patient Name: Walter [name] MRN: [id number] DOB:27-Jul-1955, 65 y.o., male Today's Date: 02/22/2021  PCP: Walter [name], Walter [name] REFERRING PROVIDER: Charlett Blake, [name]        Past Medical History:  Diagnosis Date   Atrial flutter [facility])    s/p ablation   DVT (deep venous thrombosis) ([place])    Near syncope 09/25/2013   Paroxysmal atrial fibrillation ([place])    Stroke [facility])    initially hemorragic (not on [facility]) followed by subsequent embolic stroke   Past Surgical History:  Procedure Laterality Date   A-FLUTTER ABLATION N/A 03/09/2020   Procedure: A-FLUTTER ABLATION;  Surgeon: Walter Grayer, [name];  Location: [place] CV LAB;  Service: Cardiovascular;  Laterality: N/A;   CARDIOVERSION N/A 09/27/2013   Procedure: CARDIOVERSION;  Surgeon: Walter Perla, [name];  Location: [facility] ENDOSCOPY;  Service: Cardiovascular;  Laterality: N/A;   IR ANGIO EXTERNAL CAROTID SEL EXT CAROTID UNI R MOD SED  09/26/2020   IR ANGIO INTRA EXTRACRAN SEL COM CAROTID INNOMINATE BILAT MOD SED  09/26/2020   IR ANGIO VERTEBRAL SEL VERTEBRAL UNI R MOD SED  09/26/2020   IR IVC FILTER PLMT / S&I /IMG GUID/MOD SED  10/08/2020   IR IVUS EACH ADDITIONAL NON CORONARY VESSEL  11/02/2020   IR PTA VENOUS EXCEPT DIALYSIS CIRCUIT  11/02/2020   IR RADIOLOGIST EVAL & MGMT  01/17/2021   IR THROMBECT VENO MECH MOD SED  10/27/2020   IR THROMBECT VENO MECH MOD SED  11/02/2020   IR US GUIDE VASC ACCESS LEFT  10/27/2020   IR US GUIDE VASC ACCESS RIGHT  09/26/2020   IR US GUIDE VASC ACCESS RIGHT  11/02/2020   IR VENO/EXT/UNI RIGHT  11/02/2020   IR VENOCAVAGRAM IVC  10/27/2020   RETINAL DETACHMENT SURGERY     right knee arthroscopy     TEE WITHOUT CARDIOVERSION N/A 09/27/2013   Procedure: TRANSESOPHAGEAL ECHOCARDIOGRAM (TEE);  Surgeon: Walter Perla, [name];  Location: [facility] ENDOSCOPY;  Service: Cardiovascular;  Laterality: N/A;   WRIST SURGERY     Patient Active Problem List   Diagnosis Date Noted    Thrombosis    Sleep disturbance    Dysphagia, post-stroke    PAF (paroxysmal atrial fibrillation) (HCC)    Acute pulmonary embolism without acute cor pulmonale (HCC)    Malnutrition of moderate degree 11/04/2020   Tracheostomy care ([place])    Pressure injury of skin 10/23/2020   Intracranial hemorrhage (HCC)    Atrial fibrillation (HCC)    Prediabetes    Leukocytosis    Acute blood loss anemia    Hypernatremia    ICH (intracerebral hemorrhage) ([place]) 09/25/2020   Unilateral primary osteoarthritis, left knee 09/26/2016   Atrial flutter ([place]) 09/25/2013   Near syncope 09/25/2013    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   THERAPY DIAG:  Ataxia  Other lack of coordination  Other disturbances of skin sensation  Muscle weakness (generalized)  PERTINENT HISTORY: atrial flutter s/p ablation, DVT, near syncope, CVA  PRECAUTIONS: fall  SUBJECTIVE: Was able to attend 2 birthday parties in [place] this weekend without incident  Objective:   TODAY'S TREATMENT:  02/19/21 Sit to stand 10x encouraging midline with OH lift to encourage posture Gait training: 115 feet with RW, cues for standing up tall, keeping in middle of walker, focus on step length and foot placement, tendency speed up and lose control but able  to correct cadence when cued, 3# wt to LLE helped with control Seated OH lift with ball to facilitate posture Scifit [name] 22, arms 8, L 3.0 x8 min. Standing in walker, tapping 2 floor targets unilateral and then alternating 10x ea. Leg requiring MinA for stability Standing in walker, steeping and WS onto 4" block, unilateral advancing to alternating.    02/16/21: Sit to stand 5x encouraging midline Gait training: 230 feet with RW, cues for standing up tall, keeping in middle of walker, focus on step length and foot placement, tendency speed up and  lose control but able to correct cadence when cued Standing in // bars, stepping to 4" block, 10x alternating pattern, anterior and lateral stepping with weight shifting, cued for postural correction. Static standing on Airex in // bars, 3x 60s with diminishing levels of CGA and no UE support Forward, retro and sidestepping in // bars, BUE support, 5 trips Vcs to shorten steps and control foot placement   02/12/2021:  Transfers: sit > stand from transport wheelchair with RW and cues to bring chest forward in midline for controlled, equal WB during sit > stand.  Pt consistently performed sit <> stand with min A.  Stand pivot to mat with RW and min A and max verbal cues to sequence pivot.  Pt required cues intermittently to keep close to RW when pivoting for safety.  NMR with physioball in sitting: LUE on ball on L side performed lateral weight shifts to L pushing ball and elongating trunk on L side x 10 reps, therapist provided cues for head righting back to midline.  Changed and put ball in front of pt.: placed bilat UE on ball and performed leaning forwards pushing ball out in midline keeping head upright and trunk upright to weight shift over BOS x 5 reps, changed to weight shifting forwards and coming to a squat in midline x 5 reps.  Progressed to 5 reps coming to a squat and lifting RUE off ball for increased weight shift to L x 5 second hold.  Gait: Side stepping with RW to L and R x 15' with min A and verbal cues to sequence; discussed how pt could use side stepping to negotiate through narrow spaces/doorways instead of trying to fit through with RW going forwards.  Performed walking in hallway with LUE HHA and RUE reaching up and to the R - cues to keep RUE high on wall to facilitate increased weight shift to R and proprioceptive feedback to decrease pt veering too far to L when ambulating.  Pt required mod A to perform x 4 reps.     PATIENT EDUCATION: Education details: Side stepping with RW  through narrow spaces Person educated: Patient and caregiver Education method: Explanation, Demonstration, and Verbal cues Education comprehension: verbalized understanding     HOME EXERCISE PROGRAM: Continue daily walking with CG assist  ASSESSMENT:   CLINICAL IMPRESSION: Continued gait and balance training emphasizing pacing, proper step length and cadence as well as good safety awareness.  Added 3# wt to L foot to help control ataxia which helped.  Began aerobic training on Scifit. Continues to require cuing for safe transfer and especially hand placement.  REHAB POTENTIAL: Good   CLINICAL DECISION MAKING: Stable/uncomplicated  GOALS: Goals reviewed with patient? Yes   SHORT TERM GOALS:   STG Name Target Date Goal status  1 Patient will be able to perform chair to chair transfer with CGA/SBA with use of RW for safety to improve independence. Baseline: min  to mod A (12/26/20) 01/23/2021 02/05/21 Able to transfer STS with SBA  2 Pt will be able to ambulate 115' with RW with CGA to min A with RW for safety to improve independence Baseline: min to mod A with RW 145' 01/23/2021 02/05/21 Ambulation of 116fx2 with RW and CGA/minA   3 Pt will be able to maintain standing balance for 30 sec to improve static balance Baseline: 10 sec before needing help due to LOB (12/26/20) 01/23/2021 02/05/21 able to stand with S for 30s, goal met    LONG TERM GOALS:   LTG Name Target Date Goal status  1 Pt will demo >5 points improvement on Berg Balance Scale to improve static balance Baseline: 11/56 01/03/21 02/20/2021 INITIAL  2 Pt will be able to ambulate >500 feet with RW and SBA to improve walking endurance and safety. Baseline: 145' with RW min to mod A 02/20/2021 INITIAL  3 Pt will be able to perform gait speed improvement by 0.131m to improve community ambulation. Baseline:TBD 02/20/2021 INITIAL  4 Pt will demo 5 sec improvement in 5x sit to stand (with or without UE support) to improve functional  strength Baseline: TBD 02/20/2021 INITIAL    PLAN: PT FREQUENCY: 2x/week   PT DURATION: 8 weeks   PLANNED INTERVENTIONS: Therapeutic exercises, Therapeutic activity, Neuro Muscular re-education, Balance training, Gait training, Patient/Family education, Joint mobilization, Stair training, Visual/preceptual remediation/compensation, Wheelchair mobility training, Cryotherapy, Moist heat, and Manual therapy   PLAN FOR NEXT SESSION: Standing/walking tasks in // bars, floor targets and stepping tasks, higher level gait training-stepping over and around obstacles, head and trunk righting, STGs check    Visit Diagnosis: Ataxia  Other lack of coordination  Other disturbances of skin sensation  Muscle weakness (generalized)     Problem List Patient Active Problem List   Diagnosis Date Noted   Thrombosis    Sleep disturbance    Dysphagia, post-stroke    PAF (paroxysmal atrial fibrillation) ([place]   Acute pulmonary embolism without acute cor pulmonale ([facility]   Malnutrition of moderate degree 11/04/2020   Tracheostomy care ([place]   Pressure injury of skin 10/23/2020   Intracranial hemorrhage (HCC)    Atrial fibrillation ([facility]   Prediabetes    Leukocytosis    Acute blood loss anemia    Hypernatremia    ICH (intracerebral hemorrhage) ([facility]03/28/2022   Unilateral primary osteoarthritis, left knee 09/26/2016   Atrial flutter ([facility]03/28/2015   Near syncope 09/25/2013     Walter [name] 02/22/21    8:00 AM    [id number]: [phone] Fax:  [phone]: StTAVEON ENYEARTRN: [id number] of Birth: 3/16-Nov-1955

## 2021-02-22 NOTE — Therapy (Signed)
OUTPATIENT PHYSICAL THERAPY TREATMENT NOTE  Patient Name: Walter [name] MRN: [id number] DOB:[birth year], 65 y.o., male Today's Date: 02/22/2021  PCP: Alroy Dust, L.Marlou Sa, MD REFERRING PROVIDER: Charlett Blake, MD   PT End of Session - 02/22/21 1314     Visit Number 16    Number of Visits 17    Date for PT Re-Evaluation 02/20/21    Authorization Type Medicare 12/26/20 to 02/20/21    Progress Note Due on Visit 75    PT Start Time 4854    PT Stop Time 1315    PT Time Calculation (min) 45 min    Behavior During Therapy Impulsive                 Past Medical History:  Diagnosis Date   Atrial flutter [facility])    s/p ablation   DVT (deep venous thrombosis) (HCC)    Near syncope 09/25/2013   Paroxysmal atrial fibrillation (HCC)    Stroke [facility])    initially hemorragic (not on [place]) followed by subsequent embolic stroke   Past Surgical History:  Procedure Laterality Date   A-FLUTTER ABLATION N/A 03/09/2020   Procedure: A-FLUTTER ABLATION;  Surgeon: Thompson Grayer, MD;  Location: [place] CV LAB;  Service: Cardiovascular;  Laterality: N/A;   CARDIOVERSION N/A 09/27/2013   Procedure: CARDIOVERSION;  Surgeon: Lelon Perla, MD;  Location: [facility] ENDOSCOPY;  Service: Cardiovascular;  Laterality: N/A;   IR ANGIO EXTERNAL CAROTID SEL EXT CAROTID UNI R MOD SED  09/26/2020   IR ANGIO INTRA EXTRACRAN SEL COM CAROTID INNOMINATE BILAT MOD SED  09/26/2020   IR ANGIO VERTEBRAL SEL VERTEBRAL UNI R MOD SED  09/26/2020   IR IVC FILTER PLMT / S&I /IMG GUID/MOD SED  10/08/2020   IR IVUS EACH ADDITIONAL NON CORONARY VESSEL  11/02/2020   IR PTA VENOUS EXCEPT DIALYSIS CIRCUIT  11/02/2020   IR RADIOLOGIST EVAL & MGMT  01/17/2021   IR THROMBECT VENO MECH MOD SED  10/27/2020   IR THROMBECT VENO MECH MOD SED  11/02/2020   IR US GUIDE VASC ACCESS LEFT  10/27/2020   IR US GUIDE VASC ACCESS RIGHT  09/26/2020   IR US GUIDE VASC ACCESS RIGHT  11/02/2020   IR VENO/EXT/UNI RIGHT  11/02/2020   IR VENOCAVAGRAM IVC   10/27/2020   RETINAL DETACHMENT SURGERY     right knee arthroscopy     TEE WITHOUT CARDIOVERSION N/A 09/27/2013   Procedure: TRANSESOPHAGEAL ECHOCARDIOGRAM (TEE);  Surgeon: Lelon Perla, MD;  Location: [facility] ENDOSCOPY;  Service: Cardiovascular;  Laterality: N/A;   WRIST SURGERY     Patient Active Problem List   Diagnosis Date Noted   Thrombosis    Sleep disturbance    Dysphagia, post-stroke    PAF (paroxysmal atrial fibrillation) ([place])    Acute pulmonary embolism without acute cor pulmonale (HCC)    Malnutrition of moderate degree 11/04/2020   Tracheostomy care ([place])    Pressure injury of skin 10/23/2020   Intracranial hemorrhage (HCC)    Atrial fibrillation (HCC)    Prediabetes    Leukocytosis    Acute blood loss anemia    Hypernatremia    ICH (intracerebral hemorrhage) ([place]) 09/25/2020   Unilateral primary osteoarthritis, left knee 09/26/2016   Atrial flutter ([place]) 09/25/2013   Near syncope 09/25/2013    REFERRING DIAG: I61.3 (ICD-10-CM) - 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 ([place]) I69.193 (ICD-10-CM) - 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   THERAPY DIAG:  Muscle weakness (generalized)  Ataxic gait  Cerebrovascular  accident (CVA), unspecified mechanism ([place])  PERTINENT HISTORY: atrial flutter s/p ablation, DVT, near syncope, CVA  PRECAUTIONS: fall  SUBJECTIVE: Stepping fwd has been better but stepping back still challenging, especially with LLE  Objective:   TODAY'S TREATMENT:    02/22/21:                 Seated hip/shoulder tosses, chops and Vs with 3.3# ball 10x Seated OH reach with 3.3# ball with inspiration on lift Seated LAQs alternating with latissimus press to facilitate seated trunk control, 15x per LE Seated LE marching alternating with latissimus press to facilitate seated trunk control, 15x per LE Seated heel slides alternating with latissimus press to facilitate seated trunk control, 15x per LE Standing and stepping  alternating using foam roll to maintain midline with Min/Mod assist from PT Standing and performing OH lift with ball, 10x with inspiration, repeated with tandem stance 10 more times Nustep arms 10 L4 8' seat 12  02/21/21: Scifit: manual level 5 for 10', cues to slow down through out to improve pacing Cues required for safe transfer to make sure he fully turns around before sitting and then backs up to chair/mat table and reaches with arms to arm rest before sitting down. Requires cues for safe hand placement prior to standing up safely Seated horizontal head turns: 20x Standing horizontal head turns: 20x Standing OH reach with cues to look up to follow hands: bil: 10x Standing toe touching: 10x, cues to not sway back when standing erect to prevent posterior LOB Standing head/body turns to look behind: 10x R and L Functional movement: pt stands up using UE, stabilizes, takes one step fwd, one step bwd, reahes for chair and sit down: cues for slowing down, controlling each movement: 5x, mod A required as patient unable to control step length with L leg due to coordination issues from ataxia, cues to improve vidual contact to his feet to improve coordination with stepping  Prone on elbows and knees: hip extensions: alternating: 10x R and L    02/19/21 Sit to stand 10x encouraging midline with OH lift to encourage posture Gait training: 115 feet with RW, cues for standing up tall, keeping in middle of walker, focus on step length and foot placement, tendency speed up and lose control but able to correct cadence when cued, 3# wt to LLE helped with control Seated OH lift with ball to facilitate posture Scifit seat 22, arms 8, L 3.0 x8 min. Standing in walker, tapping 2 floor targets unilateral and then alternating 10x ea. Leg requiring MinA for stability Standing in walker, steeping and WS onto 4" block, unilateral advancing to alternating.    02/16/21: Sit to stand 5x encouraging midline Gait  training: 230 feet with RW, cues for standing up tall, keeping in middle of walker, focus on step length and foot placement, tendency speed up and lose control but able to correct cadence when cued Standing in // bars, stepping to 4" block, 10x alternating pattern, anterior and lateral stepping with weight shifting, cued for postural correction. Static standing on Airex in // bars, 3x 60s with diminishing levels of CGA and no UE support Forward, retro and sidestepping in // bars, BUE support, 5 trips Vcs to shorten steps and control foot placement   02/12/2021:  Transfers: sit > stand from transport wheelchair with RW and cues to bring chest forward in midline for controlled, equal WB during sit > stand.  Pt consistently performed sit <> stand with min A.  Stand  pivot to mat with RW and min A and max verbal cues to sequence pivot.  Pt required cues intermittently to keep close to RW when pivoting for safety.  NMR with physioball in sitting: LUE on ball on L side performed lateral weight shifts to L pushing ball and elongating trunk on L side x 10 reps, therapist provided cues for head righting back to midline.  Changed and put ball in front of pt.: placed bilat UE on ball and performed leaning forwards pushing ball out in midline keeping head upright and trunk upright to weight shift over BOS x 5 reps, changed to weight shifting forwards and coming to a squat in midline x 5 reps.  Progressed to 5 reps coming to a squat and lifting RUE off ball for increased weight shift to L x 5 second hold.  Gait: Side stepping with RW to L and R x 15' with min A and verbal cues to sequence; discussed how pt could use side stepping to negotiate through narrow spaces/doorways instead of trying to fit through with RW going forwards.  Performed walking in hallway with LUE HHA and RUE reaching up and to the R - cues to keep RUE high on wall to facilitate increased weight shift to R and proprioceptive feedback to decrease pt  veering too far to L when ambulating.  Pt required mod A to perform x 4 reps.     PATIENT EDUCATION: Education details: Side stepping with RW through narrow spaces Person educated: Patient and caregiver Education method: Explanation, Demonstration, and Verbal cues Education comprehension: verbalized understanding     HOME EXERCISE PROGRAM: Continue daily walking with CG assist  ASSESSMENT:   CLINICAL IMPRESSION: Todays session focused on seated balance tasks to help identify and maintain midline while incorporating functional LE movement patterns.  Continued need of assist to maintain balance, LLE more ataxic than R with patient recognizing as well.  Added standing tasks in tandem with OH reaching to facilitate balance and posture.  REHAB POTENTIAL: Good   CLINICAL DECISION MAKING: Stable/uncomplicated  GOALS: Goals reviewed with patient? Yes   SHORT TERM GOALS:   STG Name Target Date Goal status  1 Patient will be able to perform chair to chair transfer with CGA/SBA with use of RW for safety to improve independence. Baseline: min to mod A (12/26/20) 01/23/2021 02/22/21 Able to transfer STS and stand pivot with SBA/CGA  2 Pt will be able to ambulate 115' with RW with CGA to min A with RW for safety to improve independence Baseline: min to mod A with RW 145' 01/23/2021 02/05/21 Ambulation of 151fx2 with RW and CGA/minA   3 Pt will be able to maintain standing balance for 30 sec to improve static balance Baseline: 10 sec before needing help due to LOB (12/26/20) 01/23/2021 02/05/21 able to stand with S for 30s, goal met    LONG TERM GOALS:   LTG Name Target Date Goal status  1 Pt will demo >5 points improvement on Berg Balance Scale to improve static balance Baseline: 11/56 01/03/21 02/20/2021 INITIAL  2 Pt will be able to ambulate >500 feet with RW and SBA to improve walking endurance and safety. Baseline: 145' with RW min to mod A 02/20/2021 INITIAL  3 Pt will be able to perform gait  speed improvement by 0.112m to improve community ambulation. Baseline:TBD 02/20/2021 INITIAL  4 Pt will demo 5 sec improvement in 5x sit to stand (with or without UE support) to improve functional strength Baseline: TBD  02/20/2021 INITIAL    PLAN: PT FREQUENCY: 2x/week   PT DURATION: 8 weeks   PLANNED INTERVENTIONS: Therapeutic exercises, Therapeutic activity, Neuro Muscular re-education, Balance training, Gait training, Patient/Family education, Joint mobilization, Stair training, Visual/preceptual remediation/compensation, Wheelchair mobility training, Cryotherapy, Moist heat, and Manual therapy   PLAN FOR NEXT SESSION: Standing/walking tasks in // bars, floor targets and stepping tasks, higher level gait training-stepping over and around obstacles, head and trunk righting, STGs check, sitting balance tasks, BERG, gait speed and 5x STS    Visit Diagnosis: Muscle weakness (generalized)  Ataxic gait  Cerebrovascular accident (CVA), unspecified mechanism ([place])     Problem List Patient Active Problem List   Diagnosis Date Noted   Thrombosis    Sleep disturbance    Dysphagia, post-stroke    PAF (paroxysmal atrial fibrillation) ([place])    Acute pulmonary embolism without acute cor pulmonale ([place])    Malnutrition of moderate degree 11/04/2020   Tracheostomy care ([place])    Pressure injury of skin 10/23/2020   Intracranial hemorrhage (HCC)    Atrial fibrillation ([place])    Prediabetes    Leukocytosis    Acute blood loss anemia    Hypernatremia    ICH (intracerebral hemorrhage) ([place]) 09/25/2020   Unilateral primary osteoarthritis, left knee 09/26/2016   Atrial flutter ([place]) 09/25/2013   Near syncope 09/25/2013     Leroy Sea PT 02/22/21    2:22 PM    [place] [address] [place], [place], [address] Phone: [phone]   Fax:  [phone]  Name: Walter [name] MRN: [id number] Date of Birth:  [birth year]

## 2021-02-22 NOTE — Therapy (Signed)
[facility] [address] [place], [place], [address] Phone: [phone]   Fax:  [phone]  Occupational Therapy Treatment  Patient Details  Name: Walter [name] MRN: [id number] Date of Birth: 08/10/1955 Referring Provider (OT): Dr. Alysia Penna   Encounter Date: 02/22/2021   OT End of Session - 02/22/21 1201     Visit Number 16    Number of Visits 89    Date for OT Re-Evaluation 04/27/21    Authorization Type Medicare Part A & B    Authorization - Visit Number 16    Authorization - Number of Visits 20    Progress Note Due on Visit 20    OT Start Time 1150    OT Stop Time 1230    OT Time Calculation (min) 40 min    Activity Tolerance Patient tolerated treatment well    Behavior During Therapy Impulsive             Past Medical History:  Diagnosis Date   Atrial flutter ([place])    s/p ablation   DVT (deep venous thrombosis) ([place])    Near syncope 09/25/2013   Paroxysmal atrial fibrillation ([place])    Stroke ([place])    initially hemorragic (not on [place]) followed by subsequent embolic stroke    Past Surgical History:  Procedure Laterality Date   A-FLUTTER ABLATION N/A 03/09/2020   Procedure: A-FLUTTER ABLATION;  Surgeon: Thompson Grayer, MD;  Location: [facility];  Service: Cardiovascular;  Laterality: N/A;   CARDIOVERSION N/A 09/27/2013   Procedure: CARDIOVERSION;  Surgeon: Lelon Perla, MD;  Location: [facility] ENDOSCOPY;  Service: Cardiovascular;  Laterality: N/A;   IR ANGIO EXTERNAL CAROTID SEL EXT CAROTID UNI R MOD SED  09/26/2020   IR ANGIO INTRA EXTRACRAN SEL COM CAROTID INNOMINATE BILAT MOD SED  09/26/2020   IR ANGIO VERTEBRAL SEL VERTEBRAL UNI R MOD SED  09/26/2020   IR IVC FILTER PLMT / S&I /IMG GUID/MOD SED  10/08/2020   IR IVUS EACH ADDITIONAL NON CORONARY VESSEL  11/02/2020   IR PTA VENOUS EXCEPT DIALYSIS CIRCUIT  11/02/2020   IR RADIOLOGIST EVAL & MGMT  01/17/2021   IR THROMBECT VENO MECH MOD SED   10/27/2020   IR THROMBECT VENO MECH MOD SED  11/02/2020   IR US GUIDE VASC ACCESS LEFT  10/27/2020   IR US GUIDE VASC ACCESS RIGHT  09/26/2020   IR US GUIDE VASC ACCESS RIGHT  11/02/2020   IR VENO/EXT/UNI RIGHT  11/02/2020   IR VENOCAVAGRAM IVC  10/27/2020   RETINAL DETACHMENT SURGERY     right knee arthroscopy     TEE WITHOUT CARDIOVERSION N/A 09/27/2013   Procedure: TRANSESOPHAGEAL ECHOCARDIOGRAM (TEE);  Surgeon: Lelon Perla, MD;  Location: [facility] ENDOSCOPY;  Service: Cardiovascular;  Laterality: N/A;   WRIST SURGERY      There were no vitals filed for this visit.   Subjective Assessment - 02/22/21 1251     Subjective  Pt denies pain    Pertinent History ICH of brainstem (complicated by hydrocephalus, DVTs, and tracheostomy).  PMH:  a-fib    Limitations fall risk, ataxia, impulsivity    Patient Stated Goals use L side of body better (decrease ataxia), stay active and heal brain    Currently in Pain? No/denies               Treatment: Standing to perform functional reaching with right then left UE to place and remove graded clothespins from vertical antennae, min to mod  facilitation/ v.c for balance, mod difficulty with LUE use due to ataxia. Pt remains impulsive with sit to stand and demonstrates decreased balance. Increased time and several rest breaks required. Pt continues to use the transpore tape on his glasses instead of patch to minimize diplopia. Seated at table, placing and removing grooved pegs from pegboard min difficulty with RUE, min-mod difficulty with LUE, v.c to rest arm on table due to ataxia. Tossing die with RUE and then rotating block in left hand to match the number for improved in hand manipulation.                     OT Short Term Goals - 02/22/21 1159       OT SHORT TERM GOAL #1   Title Pt/caregiver will be independent with initial HEP for LUE coordination and R hand strength.--    Time 4    Period Weeks    Status Achieved   needs  continued reinforcement, pt is not using consistently   Target Date 03/02/21      OT SHORT TERM GOAL #2   Title Pt/caregiver will be independent with vision HEP and visual compensation strategies.    Time 4    Period Weeks    Status On-going   Pt/ family verbalize understanding of vision HEP, needs continued reinforcement of compensatory strategeis- pt is not performing head turns     OT SHORT TERM GOAL #3   Title Pt will improve LUE coordination for ADLs as shown by improving score on box and blocks test by at least 6.    Baseline L-21 blocks    Time 4    Period Weeks    Status On-going   25 blocks     OT SHORT TERM GOAL #4   Title Pt will perform simple snack prep/home maintenance task from w/c level mod I.    Time 4    Period Weeks    Status Achieved   met per pt's wife     OT SHORT TERM GOAL #5   Title Pt will verbalize understanding of compensation strategies for sensory deficts for incr safety.    Time 4    Period Weeks    Status On-going   Pt verbalized that he has to be careful about temperature, needs additional reinforcement     OT SHORT TERM GOAL #6   Title Pt will improve R hand grip stength to at least 32lbs to assist with ADLs/opening containers.    Time 4    Period Weeks    Status Achieved   33.5     OT SHORT TERM GOAL #7   Title --               OT Long Term Goals - 02/02/21 1416       OT LONG TERM GOAL #1   Title Pt will be independent with updated HEP--    Time 12    Period Weeks    Status On-going    Target Date 04/27/21      OT LONG TERM GOAL #2   Title Pt will improve L hand coordination for ADLs as shown by completing 9-hole peg test in less than 70sec.    Baseline 107sec    Time 12    Period Weeks    Status On-going      OT LONG TERM GOAL #3   Title Pt will be mod I with toileting.    Time 12  Period Weeks    Status On-going      OT LONG TERM GOAL #4   Title Pt will perform simple snack prep/home maintenance tasks in  standing with supervision.    Time 12    Period Weeks    Status On-going      OT LONG TERM GOAL #5   Title Pt will demo good safety awareness/compensation for sensory deficits and attention to LUE during functional tasks and transfers without cueing.    Time 12    Period Weeks    Status On-going      OT LONG TERM GOAL #6   Title Pt will perform simple environmental scanning/navigation with at least 90% accuracy for incr safety.    Time 12    Period Weeks    Status On-going      OT LONG TERM GOAL #7   Title Pt will perform all basic ADLS with no more than supervision.    Time 12    Period Weeks    Status New                   Plan - 02/22/21 1249     Clinical Impression Statement Pt progressing slowly towards goals . He demonstrates improving bilateral fine motor coordination today.    OT Occupational Profile and History Detailed Assessment- Review of Records and additional review of physical, cognitive, psychosocial history related to current functional performance    Occupational performance deficits (Please refer to evaluation for details): ADL's;IADL's;Work;Leisure;Social Participation    Body Structure / Function / Physical Skills ADL;Strength;Balance;Proprioception;UE functional use;IADL;Endurance;Vision;Mobility;Coordination;Decreased knowledge of precautions;FMC;GMC;Sensation    Cognitive Skills Attention;Safety Awareness;Memory;Perception;Problem Solve    Rehab Potential Good    Clinical Decision Making Several treatment options, min-mod task modification necessary    Comorbidities Affecting Occupational Performance: May have comorbidities impacting occupational performance    Modification or Assistance to Complete Evaluation  Min-Moderate modification of tasks or assist with assess necessary to complete eval    OT Frequency 3x / week   updated frequency may decrease after 4 weeks   OT Duration 12 weeks   written for 12 weeks however may d/c after 8 depenent upon  progress.   OT Treatment/Interventions Self-care/ADL training;Moist Heat;Fluidtherapy;DME and/or AE instruction;Balance training;Therapeutic activities;Aquatic Therapy;Ultrasound;Therapeutic exercise;Cognitive remediation/compensation;Visual/perceptual remediation/compensation;Functional Mobility Training;Neuromuscular education;Cryotherapy;Energy conservation;Manual Therapy;Patient/family education    Plan , functional reaching, standing balance, UE control    Consulted and Agree with Plan of Care Family member/caregiver;Patient    Family Member Consulted wife             Patient will benefit from skilled therapeutic intervention in order to improve the following deficits and impairments:   Body Structure / Function / Physical Skills: ADL, Strength, Balance, Proprioception, UE functional use, IADL, Endurance, Vision, Mobility, Coordination, Decreased knowledge of precautions, FMC, GMC, Sensation Cognitive Skills: Attention, Safety Awareness, Memory, Perception, Problem Solve     Visit Diagnosis: Other lack of coordination  Other disturbances of skin sensation  Visuospatial deficit  Frontal lobe and executive function deficit  Ataxia  Other symptoms and signs involving cognitive functions following other nontraumatic intracranial hemorrhage  Unsteadiness on feet    Problem List Patient Active Problem List   Diagnosis Date Noted   Thrombosis    Sleep disturbance    Dysphagia, post-stroke    PAF (paroxysmal atrial fibrillation) ([facility])    Acute pulmonary embolism without acute cor pulmonale (HCC)    Malnutrition of moderate degree 11/04/2020   Tracheostomy care ([place])  Pressure injury of skin 10/23/2020   Intracranial hemorrhage (H[facility])    Atrial fibrillation (H[facility])    Prediabetes    Leukocytosis    Acute blood loss anemia    Hypernatremia    ICH (intracerebral hemorrhage) ([place]) 09/25/2020   Unilateral primary osteoarthritis, left knee 09/26/2016   Atrial flutter ([place])  09/25/2013   Near syncope 09/25/2013    Cynthis Purington 02/22/2021, 12:54 PM  [facility] [address] [place], [place], [address] Phone: [phone]   Fax:  [phone]  Name: AXELL TRIGUEROS MRN: [id number] Date of Birth: Jan 17, 1956

## 2021-02-23 NOTE — Therapy (Signed)
OUTPATIENT PHYSICAL THERAPY TREATMENT NOTE  Patient Name: Walter [name] MRN: [id number] DOB:02/16/1956, 65 y.o., male Today's Date: 02/23/2021  PCP: Clovis Riley, L.August Saucer, MD REFERRING PROVIDER: Erick Colace, MD        Past Medical History:  Diagnosis Date   Atrial flutter [facility])    s/p ablation   DVT (deep venous thrombosis) ([facility])    Near syncope 09/25/2013   Paroxysmal atrial fibrillation ([facility])    Stroke [facility])    initially hemorragic (not on [facility]) followed by subsequent embolic stroke   Past Surgical History:  Procedure Laterality Date   A-FLUTTER ABLATION N/A 03/09/2020   Procedure: A-FLUTTER ABLATION;  Surgeon: Hillis Range, MD;  Location: [facility];  Service: Cardiovascular;  Laterality: N/A;   CARDIOVERSION N/A 09/27/2013   Procedure: CARDIOVERSION;  Surgeon: Lewayne Bunting, MD;  Location: [facility] ENDOSCOPY;  Service: Cardiovascular;  Laterality: N/A;   IR ANGIO EXTERNAL CAROTID SEL EXT CAROTID UNI R MOD SED  09/26/2020   IR ANGIO INTRA EXTRACRAN SEL COM CAROTID INNOMINATE BILAT MOD SED  09/26/2020   IR ANGIO VERTEBRAL SEL VERTEBRAL UNI R MOD SED  09/26/2020   IR IVC FILTER PLMT / S&I /IMG GUID/MOD SED  10/08/2020   IR IVUS EACH ADDITIONAL NON CORONARY VESSEL  11/02/2020   IR PTA VENOUS EXCEPT DIALYSIS CIRCUIT  11/02/2020   IR RADIOLOGIST EVAL & MGMT  01/17/2021   IR THROMBECT VENO MECH MOD SED  10/27/2020   IR THROMBECT VENO MECH MOD SED  11/02/2020   IR US GUIDE VASC ACCESS LEFT  10/27/2020   IR US GUIDE VASC ACCESS RIGHT  09/26/2020   IR US GUIDE VASC ACCESS RIGHT  11/02/2020   IR VENO/EXT/UNI RIGHT  11/02/2020   IR VENOCAVAGRAM IVC  10/27/2020   RETINAL DETACHMENT SURGERY     right knee arthroscopy     TEE WITHOUT CARDIOVERSION N/A 09/27/2013   Procedure: TRANSESOPHAGEAL ECHOCARDIOGRAM (TEE);  Surgeon: Lewayne Bunting, MD;  Location: [facility] ENDOSCOPY;  Service: Cardiovascular;  Laterality: N/A;   WRIST SURGERY     Patient Active Problem List   Diagnosis Date Noted    Thrombosis    Sleep disturbance    Dysphagia, post-stroke    PAF (paroxysmal atrial fibrillation) (HCC)    Acute pulmonary embolism without acute cor pulmonale (HCC)    Malnutrition of moderate degree 11/04/2020   Tracheostomy care (HCC)    Pressure injury of skin 10/23/2020   Intracranial hemorrhage (HCC)    Atrial fibrillation (HCC)    Prediabetes    Leukocytosis    Acute blood loss anemia    Hypernatremia    ICH (intracerebral hemorrhage) (HCC) 09/25/2020   Unilateral primary osteoarthritis, left knee 09/26/2016   Atrial flutter ([facility]) 09/25/2013   Near syncope 09/25/2013    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   THERAPY DIAG:  Ataxia  Other lack of coordination  Other disturbances of skin sensation  Muscle weakness (generalized)  PERTINENT HISTORY: atrial flutter s/p ablation, DVT, near syncope, CVA  PRECAUTIONS: fall  SUBJECTIVE: Was able to attend 2 birthday parties in [place] this weekend without incident  Objective:   TODAY'S TREATMENT:  02/19/21 Sit to stand 10x encouraging midline with OH lift to encourage posture Gait training: 115 feet with RW, cues for standing up tall, keeping in middle of walker, focus on step length and foot placement, tendency speed up and lose control but able  to correct cadence when cued, 3# wt to LLE helped with control Seated OH lift with ball to facilitate posture Scifit seat 22, arms 8, L 3.0 x8 min. Standing in walker, tapping 2 floor targets unilateral and then alternating 10x ea. Leg requiring MinA for stability Standing in walker, steeping and WS onto 4" block, unilateral advancing to alternating.    02/16/21: Sit to stand 5x encouraging midline Gait training: 230 feet with RW, cues for standing up tall, keeping in middle of walker, focus on step length and foot placement, tendency speed up and  lose control but able to correct cadence when cued Standing in // bars, stepping to 4" block, 10x alternating pattern, anterior and lateral stepping with weight shifting, cued for postural correction. Static standing on Airex in // bars, 3x 60s with diminishing levels of CGA and no UE support Forward, retro and sidestepping in // bars, BUE support, 5 trips Vcs to shorten steps and control foot placement   02/12/2021:  Transfers: sit > stand from transport wheelchair with RW and cues to bring chest forward in midline for controlled, equal WB during sit > stand.  Pt consistently performed sit <> stand with min A.  Stand pivot to mat with RW and min A and max verbal cues to sequence pivot.  Pt required cues intermittently to keep close to RW when pivoting for safety.  NMR with physioball in sitting: LUE on ball on L side performed lateral weight shifts to L pushing ball and elongating trunk on L side x 10 reps, therapist provided cues for head righting back to midline.  Changed and put ball in front of pt.: placed bilat UE on ball and performed leaning forwards pushing ball out in midline keeping head upright and trunk upright to weight shift over BOS x 5 reps, changed to weight shifting forwards and coming to a squat in midline x 5 reps.  Progressed to 5 reps coming to a squat and lifting RUE off ball for increased weight shift to L x 5 second hold.  Gait: Side stepping with RW to L and R x 15' with min A and verbal cues to sequence; discussed how pt could use side stepping to negotiate through narrow spaces/doorways instead of trying to fit through with RW going forwards.  Performed walking in hallway with LUE HHA and RUE reaching up and to the R - cues to keep RUE high on wall to facilitate increased weight shift to R and proprioceptive feedback to decrease pt veering too far to L when ambulating.  Pt required mod A to perform x 4 reps.     PATIENT EDUCATION: Education details: Side stepping with RW  through narrow spaces Person educated: Patient and caregiver Education method: Explanation, Demonstration, and Verbal cues Education comprehension: verbalized understanding     HOME EXERCISE PROGRAM: Continue daily walking with CG assist  ASSESSMENT:   CLINICAL IMPRESSION: Continued gait and balance training emphasizing pacing, proper step length and cadence as well as good safety awareness.  Added 3# wt to L foot to help control ataxia which helped.  Began aerobic training on Scifit. Continues to require cuing for safe transfer and especially hand placement.  REHAB POTENTIAL: Good   CLINICAL DECISION MAKING: Stable/uncomplicated  GOALS: Goals reviewed with patient? Yes   SHORT TERM GOALS:   STG Name Target Date Goal status  1 Patient will be able to perform chair to chair transfer with CGA/SBA with use of RW for safety to improve independence. Baseline: min  to mod A (12/26/20) 01/23/2021 02/05/21 Able to transfer STS with SBA  2 Pt will be able to ambulate 115' with RW with CGA to min A with RW for safety to improve independence Baseline: min to mod A with RW 145' 01/23/2021 02/05/21 Ambulation of 155ftx2 with RW and CGA/minA   3 Pt will be able to maintain standing balance for 30 sec to improve static balance Baseline: 10 sec before needing help due to LOB (12/26/20) 01/23/2021 02/05/21 able to stand with S for 30s, goal met    LONG TERM GOALS:   LTG Name Target Date Goal status  1 Pt will demo >5 points improvement on Berg Balance Scale to improve static balance Baseline: 11/56 01/03/21 02/20/2021 INITIAL  2 Pt will be able to ambulate >500 feet with RW and SBA to improve walking endurance and safety. Baseline: 145' with RW min to mod A 02/20/2021 INITIAL  3 Pt will be able to perform gait speed improvement by 0.97m/s to improve community ambulation. Baseline:TBD 02/20/2021 INITIAL  4 Pt will demo 5 sec improvement in 5x sit to stand (with or without UE support) to improve functional  strength Baseline: TBD 02/20/2021 INITIAL    PLAN: PT FREQUENCY: 2x/week   PT DURATION: 8 weeks   PLANNED INTERVENTIONS: Therapeutic exercises, Therapeutic activity, Neuro Muscular re-education, Balance training, Gait training, Patient/Family education, Joint mobilization, Stair training, Visual/preceptual remediation/compensation, Wheelchair mobility training, Cryotherapy, Moist heat, and Manual therapy   PLAN FOR NEXT SESSION: Standing/walking tasks in // bars, floor targets and stepping tasks, higher level gait training-stepping over and around obstacles, head and trunk righting, STGs check    Visit Diagnosis: Ataxia  Other lack of coordination  Other disturbances of skin sensation  Muscle weakness (generalized)     Problem List Patient Active Problem List   Diagnosis Date Noted   Thrombosis    Sleep disturbance    Dysphagia, post-stroke    PAF (paroxysmal atrial fibrillation) ([place])    Acute pulmonary embolism without acute cor pulmonale ([place])    Malnutrition of moderate degree 11/04/2020   Tracheostomy care ([place])    Pressure injury of skin 10/23/2020   Intracranial hemorrhage (HCC)    Atrial fibrillation ([place])    Prediabetes    Leukocytosis    Acute blood loss anemia    Hypernatremia    ICH (intracerebral hemorrhage) ([place]) 09/25/2020   Unilateral primary osteoarthritis, left knee 09/26/2016   Atrial flutter ([place]) 09/25/2013   Near syncope 09/25/2013     Leroy Sea PT 02/23/21    4:15 PM    [facility] [address] [place], [place], [address] Phone: [phone]   Fax:  [phone]  Name: MANJOT HINKS MRN: [id number] Date of Birth: 21-Oct-1955

## 2021-02-23 NOTE — Therapy (Signed)
[facility] [address] [place], [place], [address] Phone: [phone]   Fax:  [phone]  Speech Language Pathology Treatment  Patient Details  Name: Walter [name] MRN: [id number] Date of Birth: 10/11/1955 Referring Provider (SLP): Charlett Blake, MD   Encounter Date: 02/22/2021   End of Session - 02/23/21 1044     Visit Number 18    Number of Visits 16    Date for SLP Re-Evaluation 04/20/21    Authorization Type Medicare    SLP Start Time 1315    SLP Stop Time  1400    SLP Time Calculation (min) 45 min    Activity Tolerance Patient tolerated treatment well             Past Medical History:  Diagnosis Date   Atrial flutter ([place])    s/p ablation   DVT (deep venous thrombosis) ([place])    Near syncope 09/25/2013   Paroxysmal atrial fibrillation ([place])    Stroke [facility])    initially hemorragic (not on [facility]) followed by subsequent embolic stroke    Past Surgical History:  Procedure Laterality Date   A-FLUTTER ABLATION N/A 03/09/2020   Procedure: A-FLUTTER ABLATION;  Surgeon: Thompson Grayer, MD;  Location: [facility] CV LAB;  Service: Cardiovascular;  Laterality: N/A;   CARDIOVERSION N/A 09/27/2013   Procedure: CARDIOVERSION;  Surgeon: Lelon Perla, MD;  Location: [facility] ENDOSCOPY;  Service: Cardiovascular;  Laterality: N/A;   IR ANGIO EXTERNAL CAROTID SEL EXT CAROTID UNI R MOD SED  09/26/2020   IR ANGIO INTRA EXTRACRAN SEL COM CAROTID INNOMINATE BILAT MOD SED  09/26/2020   IR ANGIO VERTEBRAL SEL VERTEBRAL UNI R MOD SED  09/26/2020   IR IVC FILTER PLMT / S&I /IMG GUID/MOD SED  10/08/2020   IR IVUS EACH ADDITIONAL NON CORONARY VESSEL  11/02/2020   IR PTA VENOUS EXCEPT DIALYSIS CIRCUIT  11/02/2020   IR RADIOLOGIST EVAL & MGMT  01/17/2021   IR THROMBECT VENO MECH MOD SED  10/27/2020   IR THROMBECT VENO MECH MOD SED  11/02/2020   IR US GUIDE VASC ACCESS LEFT  10/27/2020   IR US GUIDE VASC ACCESS RIGHT  09/26/2020   IR US  GUIDE VASC ACCESS RIGHT  11/02/2020   IR VENO/EXT/UNI RIGHT  11/02/2020   IR VENOCAVAGRAM IVC  10/27/2020   RETINAL DETACHMENT SURGERY     right knee arthroscopy     TEE WITHOUT CARDIOVERSION N/A 09/27/2013   Procedure: TRANSESOPHAGEAL ECHOCARDIOGRAM (TEE);  Surgeon: Lelon Perla, MD;  Location: [facility] ENDOSCOPY;  Service: Cardiovascular;  Laterality: N/A;   WRIST SURGERY      There were no vitals filed for this visit.   Subjective Assessment - 02/22/21 1332     Subjective "I'm tired"    Currently in Pain? No/denies                   ADULT SLP TREATMENT - 02/22/21 1320       General Information   Behavior/Cognition Alert;Cooperative;Pleasant mood;Requires cueing      Treatment Provided   Treatment provided Cognitive-Linquistic      Cognitive-Linquistic Treatment   Treatment focused on Cognition;Dysarthria;Patient/family/caregiver education    Skilled Treatment Loud /a/ conducted to recalibrate volume, with average of upper 80s db achieved. In the following conversation, volume ranged from upper 60s to low 70s db with intermittent non-verbal cues needed to sustain volume and optimize breath support. Pt was inconsistently responsive to cupped ear cue. SLP targeted optimal times  for concentrated abdominal breathing as pt noted to become winded in conversation. SLP suggested focusing on shorter phrases and asking others questions as a break to use adominal breathing while actively listening to other's responses.      Assessment / Recommendations / Plan   Plan Continue with current plan of care      Progression Toward Goals   Progression toward goals Progressing toward goals              SLP Education - 02/22/21 1412     Education Details HEP BID, visual cues, how to compensate for breathing in conversation    Person(s) Educated Patient    Methods Explanation;Demonstration;Verbal cues    Comprehension Verbalized understanding;Returned demonstration;Need further  instruction;Verbal cues required              SLP Short Term Goals - 02/21/21 1552       SLP SHORT TERM GOAL #1   Title Pt will complete dysarthria HEP with occasional min A over 2 sessions    Status Not Met      SLP SHORT TERM GOAL #2   Title Pt will produce loud /a/ or "hey!" with at least high 80s dB average over 2 sessions    Status Not Met      SLP SHORT TERM GOAL #3   Title Pt will generate abdominal breathing in 16/20 answers to SLP questions over 2 sessions    Baseline 01-18-21    Status Partially Met      SLP SHORT TERM GOAL #4   Title Pt will demonstrate dysarthria compensations in 10 minute simple to mod complex conversation to be 75% intelligibile with occasional min A over 2 sessions    Status Not Met      SLP SHORT TERM GOAL #5   Title Pt will complete formal cognitive linguistic assessment in first 1-2 ST sessions    Status Achieved      SLP SHORT TERM GOAL #6   Title Pt will use memory and attention compensations for appointments, medicine management, and other daily activities with occasional min A over 2 sessions    Status Not Met              SLP Long Term Goals - 02/23/21 1046       SLP LONG TERM GOAL #1   Title Pt will complete dysarthria HEP with rare min A over 2 sessions    Time 8    Period Weeks    Status On-going    Target Date 04/20/21      SLP LONG TERM GOAL #2   Title Pt will demonstrate dysarthria compensations in 10 minute simple conversation to be 90% intelligibile with rare min A over 2 sessions    Baseline 02-19-21    Time 8    Period Weeks    Status On-going    Target Date 04/20/21      SLP LONG TERM GOAL #3   Title Pt will demo improved sustained and selective attention during therapeutic tasks with less than 3 cues for active listening during 2 sessions    Time 1    Period Weeks    Status Deferred      SLP LONG TERM GOAL #4   Title Pt will demonstrate emergent and anticipatory awareness of impulsivity with less than  5 cues to modify behaviors during 2 sessions    Status Not Met      SLP LONG TERM GOAL #5   Title Pt  will report reduced frustration and improved communication effectiveness via PROM by 2 points at last ST sessions    Baseline CES: 15.5 & V-RQOL:35    Time 8    Period Weeks    Status On-going    Target Date 04/20/21      SLP LONG TERM GOAL #6   Title Pt will selectively attend to cognitive linguistic task for 5 minutes with 3 or less cues for redirection over 3 sessions    Time 8    Period Weeks    Status On-going    Target Date 04/20/21              Plan - 02/23/21 1044     Clinical Impression Statement "Walter [name]" presents for OPST intervention secondary to stroke in March 2022. Pt worked with SLP continuing in training pt in breath support and compensations for audible, intelligible speech. Pt presents wtih improving accuracy on structured speech tasks with some improvement in carryover at conversational level noted this session. Skilled ST is cont'd warranted to address dysarthria and cognition to maximize communication effectiveness and optimize functional independence and safety at home. He continues to demonstrate reduced selective attention, processing and problem solving. Continue skilled ST for cognition as well as carryover of audible intelligible speech across settings. Recert completed.    Speech Therapy Frequency 2x / week    Duration --   16 weeks   Treatment/Interventions Cueing hierarchy;Functional tasks;Patient/family education;Cognitive reorganization;Multimodal communcation approach;Language facilitation;Compensatory techniques;Internal/external aids;SLP instruction and feedback    Potential to Achieve Goals Fair    Potential Considerations Ability to learn/carryover information;Cooperation/participation level    SLP Home Exercise Plan provided    Consulted and Agree with Plan of Care Patient             Patient will benefit from skilled therapeutic  intervention in order to improve the following deficits and impairments:   Dysarthria and anarthria  Cognitive communication deficit    Problem List Patient Active Problem List   Diagnosis Date Noted   Thrombosis    Sleep disturbance    Dysphagia, post-stroke    PAF (paroxysmal atrial fibrillation) ([place])    Acute pulmonary embolism without acute cor pulmonale (HCC)    Malnutrition of moderate degree 11/04/2020   Tracheostomy care ([place])    Pressure injury of skin 10/23/2020   Intracranial hemorrhage (HCC)    Atrial fibrillation ([place])    Prediabetes    Leukocytosis    Acute blood loss anemia    Hypernatremia    ICH (intracerebral hemorrhage) ([place]) 09/25/2020   Unilateral primary osteoarthritis, left knee 09/26/2016   Atrial flutter ([place]) 09/25/2013   Near syncope 09/25/2013    Alinda Deem, [name] 02/23/2021, 10:47 AM  [facility] [address] [place], [place], [address] Phone: [phone]   Fax:  [phone]   Name: Walter [name] MRN: [id number] Date of Birth: Dec 22, 1955

## 2021-02-23 NOTE — Therapy (Signed)
[facility] [address] [place], [place], [address] Phone: [phone]   Fax:  [phone]  Speech Language Pathology Treatment  Patient Details  Name: Walter [name] MRN: [id number] Date of Birth: May 25, 1956 Referring Provider (SLP): Charlett Blake, MD   Encounter Date: 02/21/2021   End of Session - 02/23/21 0846     Visit Number 17    Number of Visits 33    Date for SLP Re-Evaluation 04/20/21    Authorization Type Medicare    SLP Start Time 1404    SLP Stop Time  3818    SLP Time Calculation (min) 41 min    Activity Tolerance Patient tolerated treatment well             Past Medical History:  Diagnosis Date   Atrial flutter ([place])    s/p ablation   DVT (deep venous thrombosis) ([place])    Near syncope 09/25/2013   Paroxysmal atrial fibrillation ([place])    Stroke [facility])    initially hemorragic (not on [facility]) followed by subsequent embolic stroke    Past Surgical History:  Procedure Laterality Date   A-FLUTTER ABLATION N/A 03/09/2020   Procedure: A-FLUTTER ABLATION;  Surgeon: Thompson Grayer, MD;  Location: [place] CV LAB;  Service: Cardiovascular;  Laterality: N/A;   CARDIOVERSION N/A 09/27/2013   Procedure: CARDIOVERSION;  Surgeon: Lelon Perla, MD;  Location: [facility] ENDOSCOPY;  Service: Cardiovascular;  Laterality: N/A;   IR ANGIO EXTERNAL CAROTID SEL EXT CAROTID UNI R MOD SED  09/26/2020   IR ANGIO INTRA EXTRACRAN SEL COM CAROTID INNOMINATE BILAT MOD SED  09/26/2020   IR ANGIO VERTEBRAL SEL VERTEBRAL UNI R MOD SED  09/26/2020   IR IVC FILTER PLMT / S&I /IMG GUID/MOD SED  10/08/2020   IR IVUS EACH ADDITIONAL NON CORONARY VESSEL  11/02/2020   IR PTA VENOUS EXCEPT DIALYSIS CIRCUIT  11/02/2020   IR RADIOLOGIST EVAL & MGMT  01/17/2021   IR THROMBECT VENO MECH MOD SED  10/27/2020   IR THROMBECT VENO MECH MOD SED  11/02/2020   IR US GUIDE VASC ACCESS LEFT  10/27/2020   IR US GUIDE VASC ACCESS RIGHT  09/26/2020   IR US  GUIDE VASC ACCESS RIGHT  11/02/2020   IR VENO/EXT/UNI RIGHT  11/02/2020   IR VENOCAVAGRAM IVC  10/27/2020   RETINAL DETACHMENT SURGERY     right knee arthroscopy     TEE WITHOUT CARDIOVERSION N/A 09/27/2013   Procedure: TRANSESOPHAGEAL ECHOCARDIOGRAM (TEE);  Surgeon: Lelon Perla, MD;  Location: [facility] ENDOSCOPY;  Service: Cardiovascular;  Laterality: N/A;   WRIST SURGERY      There were no vitals filed for this visit.              SLP Short Term Goals - 02/21/21 1552       SLP SHORT TERM GOAL #1   Title Pt will complete dysarthria HEP with occasional min A over 2 sessions    Status Not Met      SLP SHORT TERM GOAL #2   Title Pt will produce loud /a/ or "hey!" with at least high 80s dB average over 2 sessions    Status Not Met      SLP SHORT TERM GOAL #3   Title Pt will generate abdominal breathing in 16/20 answers to SLP questions over 2 sessions    Baseline 01-18-21    Status Partially Met      SLP SHORT TERM GOAL #4   Title Pt  will demonstrate dysarthria compensations in 10 minute simple to mod complex conversation to be 75% intelligibile with occasional min A over 2 sessions    Status Not Met      SLP SHORT TERM GOAL #5   Title Pt will complete formal cognitive linguistic assessment in first 1-2 ST sessions    Status Achieved      SLP SHORT TERM GOAL #6   Title Pt will use memory and attention compensations for appointments, medicine management, and other daily activities with occasional min A over 2 sessions    Status Not Met              SLP Long Term Goals - 02/21/21 1552       SLP LONG TERM GOAL #1   Title Pt will complete dysarthria HEP with rare min A over 2 sessions    Time 8    Period Weeks    Status On-going      SLP LONG TERM GOAL #2   Title Pt will demonstrate dysarthria compensations in 10 minute simple conversation to be 90% intelligibile with rare min A over 2 sessions    Baseline 02-19-21    Time 8    Period Weeks    Status Revised       SLP LONG TERM GOAL #3   Title Pt will demo improved sustained and selective attention during therapeutic tasks with less than 3 cues for active listening during 2 sessions    Time 1    Period Weeks    Status Deferred      SLP LONG TERM GOAL #4   Title Pt will demonstrate emergent and anticipatory awareness of impulsivity with less than 5 cues to modify behaviors during 2 sessions    Time 2    Period Weeks    Status Not Met      SLP LONG TERM GOAL #5   Title Pt will report reduced frustration and improved communication effectiveness via PROM by 2 points at last ST sessions    Baseline CES: 15.5 & V-RQOL:35    Time 8    Period Weeks    Status On-going      Additional Long Term Goals   Additional Long Term Goals Yes      SLP LONG TERM GOAL #6   Title Pt will selectively attend to cognitive linguistic task for 5 minutes with 3 or less cues for redirection over 3 sessions    Time 8    Period Weeks    Status New               Patient will benefit from skilled therapeutic intervention in order to improve the following deficits and impairments:   Dysarthria and anarthria  Cognitive communication deficit    Problem List Patient Active Problem List   Diagnosis Date Noted   Thrombosis    Sleep disturbance    Dysphagia, post-stroke    PAF (paroxysmal atrial fibrillation) (H[facility])    Acute pulmonary embolism without acute cor pulmonale (HCC)    Malnutrition of moderate degree 11/04/2020   Tracheostomy care ([place])    Pressure injury of skin 10/23/2020   Intracranial hemorrhage (HCC)    Atrial fibrillation (HCC)    Prediabetes    Leukocytosis    Acute blood loss anemia    Hypernatremia    ICH (intracerebral hemorrhage) ([place]) 09/25/2020   Unilateral primary osteoarthritis, left knee 09/26/2016   Atrial flutter ([place]) 09/25/2013   Near syncope 09/25/2013  Walter [name], Walter Rusk MS, CCC-SLP 02/23/2021, 8:48 AM  [facility] [address] [place], [place], [address] Phone: [phone]   Fax:  [phone]   Name: Walter [name] MRN: [id number] Date of Birth: 04-Jul-1955

## 2021-02-26 ENCOUNTER — Other Ambulatory Visit: Payer: Self-pay

## 2021-02-26 ENCOUNTER — Ambulatory Visit: Payer: Medicare Other

## 2021-02-26 DIAGNOSIS — M6281 Muscle weakness (generalized): Secondary | ICD-10-CM

## 2021-02-26 DIAGNOSIS — R41841 Cognitive communication deficit: Secondary | ICD-10-CM

## 2021-02-26 DIAGNOSIS — R260 Ataxic gait: Secondary | ICD-10-CM

## 2021-02-26 DIAGNOSIS — R471 Dysarthria and anarthria: Secondary | ICD-10-CM | POA: Diagnosis not present

## 2021-02-26 DIAGNOSIS — R2681 Unsteadiness on feet: Secondary | ICD-10-CM

## 2021-02-26 DIAGNOSIS — I639 Cerebral infarction, unspecified: Secondary | ICD-10-CM

## 2021-02-26 NOTE — Therapy (Signed)
[facility] [address] [place], [place], [address] Phone: [phone]   Fax:  [phone]  Speech Language Pathology Treatment  Patient Details  Name: Walter [name] MRN: [id number] Date of Birth: 1955-08-24 Referring Provider (SLP): Charlett Blake, MD   Encounter Date: 02/26/2021   End of Session - 02/26/21 1705     Visit Number 19    Number of Visits 49    Date for SLP Re-Evaluation 04/20/21    Authorization Type Medicare    SLP Start Time 1447    SLP Stop Time  2694    SLP Time Calculation (min) 43 min    Activity Tolerance Patient tolerated treatment well             Past Medical History:  Diagnosis Date   Atrial flutter ([place])    s/p ablation   DVT (deep venous thrombosis) ([place])    Near syncope 09/25/2013   Paroxysmal atrial fibrillation ([place])    Stroke [facility])    initially hemorragic (not on [facility]) followed by subsequent embolic stroke    Past Surgical History:  Procedure Laterality Date   A-FLUTTER ABLATION N/A 03/09/2020   Procedure: A-FLUTTER ABLATION;  Surgeon: Thompson Grayer, MD;  Location: [place] CV LAB;  Service: Cardiovascular;  Laterality: N/A;   CARDIOVERSION N/A 09/27/2013   Procedure: CARDIOVERSION;  Surgeon: Lelon Perla, MD;  Location: [facility] ENDOSCOPY;  Service: Cardiovascular;  Laterality: N/A;   IR ANGIO EXTERNAL CAROTID SEL EXT CAROTID UNI R MOD SED  09/26/2020   IR ANGIO INTRA EXTRACRAN SEL COM CAROTID INNOMINATE BILAT MOD SED  09/26/2020   IR ANGIO VERTEBRAL SEL VERTEBRAL UNI R MOD SED  09/26/2020   IR IVC FILTER PLMT / S&I /IMG GUID/MOD SED  10/08/2020   IR IVUS EACH ADDITIONAL NON CORONARY VESSEL  11/02/2020   IR PTA VENOUS EXCEPT DIALYSIS CIRCUIT  11/02/2020   IR RADIOLOGIST EVAL & MGMT  01/17/2021   IR THROMBECT VENO MECH MOD SED  10/27/2020   IR THROMBECT VENO MECH MOD SED  11/02/2020   IR US GUIDE VASC ACCESS LEFT  10/27/2020   IR US GUIDE VASC ACCESS RIGHT  09/26/2020   IR US  GUIDE VASC ACCESS RIGHT  11/02/2020   IR VENO/EXT/UNI RIGHT  11/02/2020   IR VENOCAVAGRAM IVC  10/27/2020   RETINAL DETACHMENT SURGERY     right knee arthroscopy     TEE WITHOUT CARDIOVERSION N/A 09/27/2013   Procedure: TRANSESOPHAGEAL ECHOCARDIOGRAM (TEE);  Surgeon: Lelon Perla, MD;  Location: [facility] ENDOSCOPY;  Service: Cardiovascular;  Laterality: N/A;   WRIST SURGERY      There were no vitals filed for this visit.   Subjective Assessment - 02/26/21 1458     Subjective (what pt learned about clarity of speech) "First thing I've learned is things I have lost I have to re-learn - like walking and talking. So, for talking, belly breathing and having enough air to talk is incredibly important."    Currently in Pain? No/denies                   ADULT SLP TREATMENT - 02/26/21 1505       General Information   Behavior/Cognition Alert;Cooperative;Pleasant mood;Requires cueing      Treatment Provided   Treatment provided Cognitive-Linquistic      Cognitive-Linquistic Treatment   Treatment focused on Dysarthria;Cognition    Skilled Treatment Loud /a/ conducted to recalibrate volume, with average of upper 80s db achieved; sentences (  everyday) with average mid 70s dB. SLP reiterated loud /a/ and everyday sentences BID.  SLP engaged pt in short segments of conversation 3 minutes,  volume ranged from upper 60s to low 70s db with intermittent non-verbal cues needed to sustain volume and optimize breath support as session continued. SLP suggested to pt to use external cue such as jewelry to trigger reminder for loudness.      Assessment / Recommendations / Plan   Plan Continue with current plan of care      Progression Toward Goals   Progression toward goals Progressing toward goals              SLP Education - 02/26/21 1705     Education Details external cues for loudness    Person(s) Educated Patient;Caregiver(s)    Methods Explanation;Demonstration    Comprehension  Verbalized understanding;Need further instruction              SLP Short Term Goals - 02/21/21 1552       SLP SHORT TERM GOAL #1   Title Pt will complete dysarthria HEP with occasional min A over 2 sessions    Status Not Met      SLP SHORT TERM GOAL #2   Title Pt will produce loud /a/ or "hey!" with at least high 80s dB average over 2 sessions    Status Not Met      SLP SHORT TERM GOAL #3   Title Pt will generate abdominal breathing in 16/20 answers to SLP questions over 2 sessions    Baseline 01-18-21    Status Partially Met      SLP SHORT TERM GOAL #4   Title Pt will demonstrate dysarthria compensations in 10 minute simple to mod complex conversation to be 75% intelligibile with occasional min A over 2 sessions    Status Not Met      SLP SHORT TERM GOAL #5   Title Pt will complete formal cognitive linguistic assessment in first 1-2 ST sessions    Status Achieved      SLP SHORT TERM GOAL #6   Title Pt will use memory and attention compensations for appointments, medicine management, and other daily activities with occasional min A over 2 sessions    Status Not Met              SLP Long Term Goals - 02/26/21 1706       SLP LONG TERM GOAL #1   Title Pt will complete dysarthria HEP with rare min A over 2 sessions    Time 8    Period Weeks    Status On-going    Target Date 04/20/21      SLP LONG TERM GOAL #2   Title Pt will demonstrate dysarthria compensations in 10 minute simple conversation to be 90% intelligibile with rare min A over 2 sessions    Baseline 02-19-21    Time 8    Period Weeks    Status On-going    Target Date 04/20/21      SLP LONG TERM GOAL #3   Title Pt will demo improved sustained and selective attention during therapeutic tasks with less than 3 cues for active listening during 2 sessions    Time 1    Period Weeks    Status Deferred      SLP LONG TERM GOAL #4   Title Pt will demonstrate emergent and anticipatory awareness of  impulsivity with less than 5 cues to modify behaviors during 2 sessions  Status Not Met      SLP LONG TERM GOAL #5   Title Pt will report reduced frustration and improved communication effectiveness via PROM by 2 points at last ST sessions    Baseline CES: 15.5 & V-RQOL:35    Time 8    Period Weeks    Status On-going    Target Date 04/20/21      SLP LONG TERM GOAL #6   Title Pt will selectively attend to cognitive linguistic task for 5 minutes with 3 or less cues for redirection over 3 sessions    Time 8    Period Weeks    Status On-going    Target Date 04/20/21              Plan - 02/26/21 1705     Clinical Impression Statement "Walter [name]" presents for OPST intervention secondary to stroke in March 2022. Pt worked with SLP continuing in training pt in breath support and compensations for audible, intelligible speech. Pt presents wtih improving accuracy on structured speech tasks with some improvement in carryover at conversational level noted this session. Skilled ST is cont'd warranted to address dysarthria and cognition to maximize communication effectiveness and optimize functional independence and safety at home. He continues to demonstrate reduced selective attention, processing and problem solving. Continue skilled ST for cognition as well as carryover of audible intelligible speech across settings.    Speech Therapy Frequency 2x / week    Duration --   16 weeks   Treatment/Interventions Cueing hierarchy;Functional tasks;Patient/family education;Cognitive reorganization;Multimodal communcation approach;Language facilitation;Compensatory techniques;Internal/external aids;SLP instruction and feedback    Potential to Achieve Goals Fair    Potential Considerations Ability to learn/carryover information;Cooperation/participation level    SLP Home Exercise Plan provided    Consulted and Agree with Plan of Care Patient             Patient will benefit from skilled therapeutic  intervention in order to improve the following deficits and impairments:   Dysarthria and anarthria  Cognitive communication deficit    Problem List Patient Active Problem List   Diagnosis Date Noted   Thrombosis    Sleep disturbance    Dysphagia, post-stroke    PAF (paroxysmal atrial fibrillation) (HCC)    Acute pulmonary embolism without acute cor pulmonale (HCC)    Malnutrition of moderate degree 11/04/2020   Tracheostomy care ([place])    Pressure injury of skin 10/23/2020   Intracranial hemorrhage (HCC)    Atrial fibrillation ([place])    Prediabetes    Leukocytosis    Acute blood loss anemia    Hypernatremia    ICH (intracerebral hemorrhage) ([place]) 09/25/2020   Unilateral primary osteoarthritis, left knee 09/26/2016   Atrial flutter ([place]) 09/25/2013   Near syncope 09/25/2013    [facility] ,[place], [place]  02/26/2021, 5:07 PM  [facility] [address] [place], [place], [address] Phone: [phone]   Fax:  [phone]   Name: Walter [name] MRN: [id number] Date of Birth: [birth year]

## 2021-02-26 NOTE — Therapy (Signed)
OUTPATIENT PHYSICAL THERAPY TREATMENT NOTE  Patient Name: Walter Walter [name] MRN: [id number] DOB:01/21/1956, 65 y.o., male Today's Date: 02/26/2021  PCP: Walter Walter [name], Walter Sa, [name] REFERRING PROVIDER: Charlett Blake, [name]   Walter [name] End of Session - 02/26/21 1414     Visit Number 17    Number of Visits 17    Date for Walter [name] Re-Evaluation 04/26/21    Authorization Type Medicare 12/26/20 to 02/20/21; Recert 09/26/17 to 16/60/60    Progress Note Due on Visit 20    Behavior During Therapy Impulsive                 Past Medical History:  Diagnosis Date   Atrial flutter [facility])    s/p ablation   DVT (deep venous thrombosis) (HCC)    Near syncope 09/25/2013   Paroxysmal atrial fibrillation (HCC)    Stroke [facility])    initially hemorragic (not on [place]) followed by subsequent embolic stroke   Past Surgical History:  Procedure Laterality Date   A-FLUTTER ABLATION N/A 03/09/2020   Procedure: A-FLUTTER ABLATION;  Surgeon: Walter Walter [name];  Location: [place] CV LAB;  Service: Cardiovascular;  Laterality: N/A;   CARDIOVERSION N/A 09/27/2013   Procedure: CARDIOVERSION;  Surgeon: Walter Perla, [name];  Location: [facility] ENDOSCOPY;  Service: Cardiovascular;  Laterality: N/A;   IR ANGIO EXTERNAL CAROTID SEL EXT CAROTID UNI R MOD SED  09/26/2020   IR ANGIO INTRA EXTRACRAN SEL COM CAROTID INNOMINATE BILAT MOD SED  09/26/2020   IR ANGIO VERTEBRAL SEL VERTEBRAL UNI R MOD SED  09/26/2020   IR IVC FILTER PLMT / S&I /IMG GUID/MOD SED  10/08/2020   IR IVUS EACH ADDITIONAL NON CORONARY VESSEL  11/02/2020   IR PTA VENOUS EXCEPT DIALYSIS CIRCUIT  11/02/2020   IR RADIOLOGIST EVAL & MGMT  01/17/2021   IR THROMBECT VENO MECH MOD SED  10/27/2020   IR THROMBECT VENO MECH MOD SED  11/02/2020   IR US GUIDE VASC ACCESS LEFT  10/27/2020   IR US GUIDE VASC ACCESS RIGHT  09/26/2020   IR US GUIDE VASC ACCESS RIGHT  11/02/2020   IR VENO/EXT/UNI RIGHT  11/02/2020   IR VENOCAVAGRAM IVC  10/27/2020   RETINAL DETACHMENT SURGERY     right knee  arthroscopy     TEE WITHOUT CARDIOVERSION N/A 09/27/2013   Procedure: TRANSESOPHAGEAL ECHOCARDIOGRAM (TEE);  Surgeon: Walter Perla, [name];  Location: [place] ENDOSCOPY;  Service: Cardiovascular;  Laterality: N/A;   WRIST SURGERY     Patient Active Problem List   Diagnosis Date Noted   Thrombosis    Sleep disturbance    Dysphagia, post-stroke    PAF (paroxysmal atrial fibrillation) (HCC)    Acute pulmonary embolism without acute cor pulmonale (HCC)    Malnutrition of moderate degree 11/04/2020   Tracheostomy care ([place])    Pressure injury of skin 10/23/2020   Intracranial hemorrhage (HCC)    Atrial fibrillation (HCC)    Prediabetes    Leukocytosis    Acute blood loss anemia    Hypernatremia    ICH (intracerebral hemorrhage) ([place]) 09/25/2020   Unilateral primary osteoarthritis, left knee 09/26/2016   Atrial flutter ([place]) 09/25/2013   Near syncope 09/25/2013    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 Walter [name] reports no new falls but reports of few stumbles at home.  Objective:   TODAY'S TREATMENT:   02/26/21: Gait training: 1 x 420 feet with tactile cues at pelvis to guide weight shifts cues for slower cadence and turning slowly Sit to stand: 10x no HHA Standing at countertop with wide BOS: moving basket with 20lbs from side to side to increase core engagement and weight shifts Picking up basket with 20lb from counter to chest: 5x Working on ankle strategy: standing heel and toe raises without HHA: very close guarding: 2 x 10  Standing contralateral reaching: 10x R and L Walking with one  walking stick in R UE and min A from Walter [name], cueing to slow down with walking and controlling step length in L LE. 1 x 60 feet  02/22/21:                 Seated hip/shoulder tosses, chops and Vs with 3.3# ball 10x Seated OH reach with 3.3# ball with inspiration on lift Seated LAQs alternating with latissimus press to facilitate seated trunk control, 15x per LE Seated LE marching alternating with latissimus press to facilitate seated trunk control, 15x per LE Seated heel slides alternating with latissimus press to facilitate seated trunk control, 15x per LE Standing and stepping alternating using foam roll to maintain midline with Min/Mod assist from Walter [name] Standing and performing OH lift with ball, 10x with inspiration, repeated with tandem stance 10 more times Nustep arms 10 L4 8' seat 12  02/21/21: Scifit: manual level 5 for 10', cues to slow down through out to improve pacing Cues required for safe transfer to make sure he fully turns around before sitting and then backs up to chair/mat table and reaches with arms to arm rest before sitting down. Requires cues for safe hand placement prior to standing up safely Seated horizontal head turns: 20x Standing horizontal head turns: 20x Standing OH reach with cues to look up to follow hands: bil: 10x Standing toe touching: 10x, cues to not sway back when standing erect to prevent posterior LOB Standing head/body turns to look behind: 10x R and L Functional movement: Walter [name] stands up using UE, stabilizes, takes one step fwd, one step bwd, reahes for chair and sit down: cues for slowing down, controlling each movement: 5x, mod A required as patient unable to control step length with L leg due to coordination issues from ataxia, cues to improve vidual contact to his feet to improve coordination with stepping  Prone on elbows and knees: hip extensions: alternating: 10x R and L    02/19/21 Sit to stand 10x encouraging midline with OH lift to encourage  posture Gait training: 115 feet with RW, cues for standing up tall, keeping in middle of walker, focus on step length and foot placement, tendency speed up and lose control but able to correct cadence when cued, 3# wt to LLE helped with control Seated OH lift with ball to facilitate posture Scifit seat 22, arms 8, L 3.0 x8 min. Standing in walker, tapping 2 floor targets unilateral and then alternating 10x ea. Leg requiring MinA for stability Standing in walker, steeping and WS onto 4" block, unilateral advancing to alternating.     02/16/21: Sit to stand 5x encouraging midline Gait training: 230 feet with RW, cues for standing up tall, keeping in middle of walker, focus on step length and foot placement, tendency speed up and lose control but able to correct  cadence when cued Standing in // bars, stepping to 4" block, 10x alternating pattern, anterior and lateral stepping with weight shifting, cued for postural correction. Static standing on Airex in // bars, 3x 60s with diminishing levels of CGA and no UE support Forward, retro and sidestepping in // bars, BUE support, 5 trips Vcs to shorten steps and control foot placement   02/12/2021:  Transfers: sit > stand from transport wheelchair with RW and cues to bring chest forward in midline for controlled, equal WB during sit > stand.  Walter [name] consistently performed sit <> stand with min A.  Stand pivot to mat with RW and min A and max verbal cues to sequence pivot.  Walter [name] required cues intermittently to keep close to RW when pivoting for safety.  NMR with physioball in sitting: LUE on ball on L side performed lateral weight shifts to L pushing ball and elongating trunk on L side x 10 reps, therapist provided cues for head righting back to midline.  Changed and put ball in front of Walter [name].: placed bilat UE on ball and performed leaning forwards pushing ball out in midline keeping head upright and trunk upright to weight shift over BOS x 5 reps, changed to weight  shifting forwards and coming to a squat in midline x 5 reps.  Progressed to 5 reps coming to a squat and lifting RUE off ball for increased weight shift to L x 5 second hold.  Gait: Side stepping with RW to L and R x 15' with min A and verbal cues to sequence; discussed how Walter [name] could use side stepping to negotiate through narrow spaces/doorways instead of trying to fit through with RW going forwards.  Performed walking in hallway with LUE HHA and RUE reaching up and to the R - cues to keep RUE high on wall to facilitate increased weight shift to R and proprioceptive feedback to decrease Walter [name] veering too far to L when ambulating.  Walter [name] required mod A to perform x 4 reps.     PATIENT EDUCATION: Education details: Side stepping with RW through narrow spaces Person educated: Patient and caregiver Education method: Explanation, Demonstration, and Verbal cues Education comprehension: verbalized understanding     HOME EXERCISE PROGRAM: Continue daily walking with CG assist  ASSESSMENT:   CLINICAL IMPRESSION: Walter [name] demonstrated improved control and decreased impulsivity with walking with walker today. Walter [name] still requires cueing for proper hand placement during sit to stand transfers to improve safety.  REHAB POTENTIAL: Good   CLINICAL DECISION MAKING: Stable/uncomplicated  GOALS: Goals reviewed with patient? Yes   SHORT TERM GOALS:   STG Name Target Date Goal status  1 Patient will be able to perform chair to chair transfer with CGA/SBA with use of RW for safety to improve independence. Baseline: min to mod A (12/26/20) 01/23/2021 02/22/21 Able to transfer STS and stand pivot with SBA/CGA  2 Walter [name] will be able to ambulate 115' with RW with CGA to min A with RW for safety to improve independence Baseline: min to mod A with RW 145' 01/23/2021 02/05/21 Ambulation of 168fx2 with RW and CGA/minA   3 Walter [name] will be able to maintain standing balance for 30 sec to improve static balance Baseline: 10 sec before needing  help due to LOB (12/26/20) 01/23/2021 02/05/21 able to stand with S for 30s, goal met    LONG TERM GOALS:   LTG Name Target Date Goal status  1 Walter [name] will demo >5 points improvement on Berg Balance Scale to improve static  balance Baseline: 11/56 01/03/21 02/20/2021 INITIAL  2 Walter [name] will be able to ambulate >500 feet with RW and SBA to improve walking endurance and safety. Baseline: 145' with RW min to mod A 02/20/2021 INITIAL  3 Walter [name] will be able to perform gait speed improvement by 0.23ms to improve community ambulation. Baseline:TBD 02/20/2021 INITIAL  4 Walter [name] will demo 5 sec improvement in 5x sit to stand (with or without UE support) to improve functional strength Baseline: TBD 02/20/2021 INITIAL    PLAN: Walter [name] FREQUENCY: 2x/week   Walter [name] DURATION: 8 weeks   PLANNED INTERVENTIONS: Therapeutic exercises, Therapeutic activity, Neuro Muscular re-education, Balance training, Gait training, Patient/Family education, Joint mobilization, Stair training, Visual/preceptual remediation/compensation, Wheelchair mobility training, Cryotherapy, Moist heat, and Manual therapy   PLAN FOR NEXT SESSION: Standing/walking tasks in // bars, floor targets and stepping tasks, higher level gait training-stepping over and around obstacles, head and trunk righting, STGs check, sitting balance tasks, BERG, gait speed and 5x STS    Visit Diagnosis: Unsteadiness on feet  Muscle weakness (generalized)  Ataxic gait  Cerebrovascular accident (CVA), unspecified mechanism ([place]     Problem List Patient Active Problem List   Diagnosis Date Noted   Thrombosis    Sleep disturbance    Dysphagia, post-stroke    PAF (paroxysmal atrial fibrillation) ([place]    Acute pulmonary embolism without acute cor pulmonale ([place]    Malnutrition of moderate degree 11/04/2020   Tracheostomy care ([place]    Pressure injury of skin 10/23/2020   Intracranial hemorrhage (HCC)    Atrial fibrillation ([place]    Prediabetes    Leukocytosis    Acute blood  loss anemia    Hypernatremia    ICH (intracerebral hemorrhage) ([place] 09/25/2020   Unilateral primary osteoarthritis, left knee 09/26/2016   Atrial flutter ([place] 09/25/2013   Near syncope 09/25/2013     Walter Walter [name] 02/26/21    2:30 PM    [facility] [address]Phone: [phone]  Fax:  [phone] Name: SCAIDEN ARTEAGAMRN: [id number] of Birth: 309-14-1957

## 2021-02-27 ENCOUNTER — Other Ambulatory Visit: Payer: Self-pay | Admitting: Neurology

## 2021-02-27 ENCOUNTER — Ambulatory Visit: Payer: Medicare Other | Admitting: Occupational Therapy

## 2021-02-27 DIAGNOSIS — R278 Other lack of coordination: Secondary | ICD-10-CM

## 2021-02-27 DIAGNOSIS — R471 Dysarthria and anarthria: Secondary | ICD-10-CM | POA: Diagnosis not present

## 2021-02-27 DIAGNOSIS — R270 Ataxia, unspecified: Secondary | ICD-10-CM

## 2021-02-27 DIAGNOSIS — M6281 Muscle weakness (generalized): Secondary | ICD-10-CM

## 2021-02-27 DIAGNOSIS — R2681 Unsteadiness on feet: Secondary | ICD-10-CM

## 2021-02-27 DIAGNOSIS — R41842 Visuospatial deficit: Secondary | ICD-10-CM

## 2021-02-27 DIAGNOSIS — R208 Other disturbances of skin sensation: Secondary | ICD-10-CM

## 2021-02-27 NOTE — Addendum Note (Signed)
Addended by: [name] on: 02/27/2021 03:33 PM   Modules accepted: Orders

## 2021-02-27 NOTE — Therapy (Signed)
[facility] [address] [place], [place], [address] Phone: [phone]   Fax:  [phone]  Occupational Therapy Treatment  Patient Details  Name: Walter [name] MRN: [id number] Date of Birth: 07/03/1955 Referring Provider (OT): Dr. Alysia Penna   Encounter Date: 02/27/2021   OT End of Session - 02/27/21 1328     Visit Number 17    Number of Visits 56    Date for OT Re-Evaluation 04/27/21    Authorization Type Medicare Part A & B    Authorization - Visit Number 65    Authorization - Number of Visits 20    Progress Note Due on Visit 1    OT Start Time 1232    OT Stop Time 1315    OT Time Calculation (min) 43 min    Activity Tolerance Patient tolerated treatment well    Behavior During Therapy [facility] for tasks assessed/performed             Past Medical History:  Diagnosis Date   Atrial flutter ([place])    s/p ablation   DVT (deep venous thrombosis) ([employer])    Near syncope 09/25/2013   Paroxysmal atrial fibrillation ([place])    Stroke [facility])    initially hemorragic (not on [place]) followed by subsequent embolic stroke    Past Surgical History:  Procedure Laterality Date   A-FLUTTER ABLATION N/A 03/09/2020   Procedure: A-FLUTTER ABLATION;  Surgeon: Thompson Grayer, MD;  Location: [place] CV LAB;  Service: Cardiovascular;  Laterality: N/A;   CARDIOVERSION N/A 09/27/2013   Procedure: CARDIOVERSION;  Surgeon: Lelon Perla, MD;  Location: [facility] ENDOSCOPY;  Service: Cardiovascular;  Laterality: N/A;   IR ANGIO EXTERNAL CAROTID SEL EXT CAROTID UNI R MOD SED  09/26/2020   IR ANGIO INTRA EXTRACRAN SEL COM CAROTID INNOMINATE BILAT MOD SED  09/26/2020   IR ANGIO VERTEBRAL SEL VERTEBRAL UNI R MOD SED  09/26/2020   IR IVC FILTER PLMT / S&I /IMG GUID/MOD SED  10/08/2020   IR IVUS EACH ADDITIONAL NON CORONARY VESSEL  11/02/2020   IR PTA VENOUS EXCEPT DIALYSIS CIRCUIT  11/02/2020   IR RADIOLOGIST EVAL & MGMT  01/17/2021   IR  THROMBECT VENO MECH MOD SED  10/27/2020   IR THROMBECT VENO MECH MOD SED  11/02/2020   IR US GUIDE VASC ACCESS LEFT  10/27/2020   IR US GUIDE VASC ACCESS RIGHT  09/26/2020   IR US GUIDE VASC ACCESS RIGHT  11/02/2020   IR VENO/EXT/UNI RIGHT  11/02/2020   IR VENOCAVAGRAM IVC  10/27/2020   RETINAL DETACHMENT SURGERY     right knee arthroscopy     TEE WITHOUT CARDIOVERSION N/A 09/27/2013   Procedure: TRANSESOPHAGEAL ECHOCARDIOGRAM (TEE);  Surgeon: Lelon Perla, MD;  [facility];  Service: Cardiovascular;  Laterality: N/A;   WRIST SURGERY      There were no vitals filed for this visit.   Subjective Assessment - 02/27/21 1239     Subjective  I am going to [facility] this week to see neuro-optholmologist    Pertinent History ICH of brainstem (complicated by hydrocephalus, DVTs, and tracheostomy).  PMH:  a-fib    Limitations fall risk, ataxia, impulsivity    Patient Stated Goals use L side of body better (decrease ataxia), stay active and heal brain    Currently in Pain? No/denies    Pain Score 0-No pain  OT Treatments/Exercises (OP) - 02/27/21 0001       Neurological Re-education Exercises   Other Exercises 1 Neuromuscular reeducation to address postural control in preparation for improved limb control.  Patient walked into clinic for the first time today!  Patient with ataxic movement in trunk and limbs.  Patient did best with specific cueing, and moving at slower speed.  Patient has difficulty accepting weight on LLE in standing, so tends to lean top of body toward left and lower body toward right.  With facilitation able to weight shift and get active onto LLE.  When weight shift sufficiently, and alignment is sustained, able to move with improved control (less ataxia)                      OT Short Term Goals - 02/27/21 1332       OT SHORT TERM GOAL #1   Title Pt/caregiver will be independent with initial HEP for LUE  coordination and R hand strength.--    Time 4    Period Weeks    Status Achieved   needs continued reinforcement, pt is not using consistently   Target Date 03/02/21      OT SHORT TERM GOAL #2   Title Pt/caregiver will be independent with vision HEP and visual compensation strategies.    Time 4    Period Weeks    Status On-going   Pt/ family verbalize understanding of vision HEP, needs continued reinforcement of compensatory strategeis- pt is not performing head turns     OT SHORT TERM GOAL #3   Title Pt will improve LUE coordination for ADLs as shown by improving score on box and blocks test by at least 6.    Baseline L-21 blocks    Time 4    Period Weeks    Status On-going   25 blocks     OT SHORT TERM GOAL #4   Title Pt will perform simple snack prep/home maintenance task from w/c level mod I.    Time 4    Period Weeks    Status Achieved   met per pt's wife     OT SHORT TERM GOAL #5   Title Pt will verbalize understanding of compensation strategies for sensory deficts for incr safety.    Time 4    Period Weeks    Status On-going   Pt verbalized that he has to be careful about temperature, needs additional reinforcement     OT SHORT TERM GOAL #6   Title Pt will improve R hand grip stength to at least 32lbs to assist with ADLs/opening containers.    Time 4    Period Weeks    Status Achieved   33.5     OT SHORT TERM GOAL #7   Title --               OT Long Term Goals - 02/27/21 1332       OT LONG TERM GOAL #1   Title Pt will be independent with updated HEP--    Time 12    Period Weeks    Status On-going      OT LONG TERM GOAL #2   Title Pt will improve L hand coordination for ADLs as shown by completing 9-hole peg test in less than 70sec.    Baseline 107sec    Time 12    Period Weeks    Status On-going      OT LONG TERM GOAL #3  Title Pt will be mod I with toileting.    Time 12    Period Weeks    Status On-going      OT LONG TERM GOAL #4   Title  Pt will perform simple snack prep/home maintenance tasks in standing with supervision.    Time 12    Period Weeks    Status On-going      OT LONG TERM GOAL #5   Title Pt will demo good safety awareness/compensation for sensory deficits and attention to LUE during functional tasks and transfers without cueing.    Time 12    Period Weeks    Status On-going      OT LONG TERM GOAL #6   Title Pt will perform simple environmental scanning/navigation with at least 90% accuracy for incr safety.    Time 12    Period Weeks    Status On-going      OT LONG TERM GOAL #7   Title Pt will perform all basic ADLS with no more than supervision.    Time 12    Period Weeks    Status New                   Plan - 02/27/21 1328     Clinical Impression Statement Pt still experiencing diplopia, and ataxia - and this limits his ability to complete ADL's and functional mobility without assistance.  Patient has excellent support from family / caregivers.    OT Occupational Profile and History Detailed Assessment- Review of Records and additional review of physical, cognitive, psychosocial history related to current functional performance    Occupational performance deficits (Please refer to evaluation for details): ADL's;IADL's;Work;Leisure;Social Participation    Body Structure / Function / Physical Skills ADL;Strength;Balance;Proprioception;UE functional use;IADL;Endurance;Vision;Mobility;Coordination;Decreased knowledge of precautions;FMC;GMC;Sensation    Cognitive Skills Attention;Safety Awareness;Memory;Perception;Problem Solve    Rehab Potential Good    Clinical Decision Making Several treatment options, min-mod task modification necessary    Comorbidities Affecting Occupational Performance: May have comorbidities impacting occupational performance    Modification or Assistance to Complete Evaluation  Min-Moderate modification of tasks or assist with assess necessary to complete eval    OT  Frequency 3x / week   updated frequency may decrease after 4 weeks   OT Duration 12 weeks   written for 12 weeks however may d/c after 8 depenent upon progress.   OT Treatment/Interventions Self-care/ADL training;Moist Heat;Fluidtherapy;DME and/or AE instruction;Balance training;Therapeutic activities;Aquatic Therapy;Ultrasound;Therapeutic exercise;Cognitive remediation/compensation;Visual/perceptual remediation/compensation;Functional Mobility Training;Neuromuscular education;Cryotherapy;Energy conservation;Manual Therapy;Patient/family education    Plan , functional reaching, standing balance, UE control    Consulted and Agree with Plan of Care Family member/caregiver;Patient    Family Member Consulted wife             Patient will benefit from skilled therapeutic intervention in order to improve the following deficits and impairments:   Body Structure / Function / Physical Skills: ADL, Strength, Balance, Proprioception, UE functional use, IADL, Endurance, Vision, Mobility, Coordination, Decreased knowledge of precautions, FMC, GMC, Sensation Cognitive Skills: Attention, Safety Awareness, Memory, Perception, Problem Solve     Visit Diagnosis: Ataxia  Visuospatial deficit  Other disturbances of skin sensation  Unsteadiness on feet  Muscle weakness (generalized)  Other lack of coordination    Problem List Patient Active Problem List   Diagnosis Date Noted   Thrombosis    Sleep disturbance    Dysphagia, post-stroke    PAF (paroxysmal atrial fibrillation) (HCC)    Acute pulmonary embolism without acute cor pulmonale (HCC)  Malnutrition of moderate degree 11/04/2020   Tracheostomy care ([place])    Pressure injury of skin 10/23/2020   Intracranial hemorrhage (HCC)    Atrial fibrillation (HCC)    Prediabetes    Leukocytosis    Acute blood loss anemia    Hypernatremia    ICH (intracerebral hemorrhage) ([place]) 09/25/2020   Unilateral primary osteoarthritis, left knee  09/26/2016   Atrial flutter ([place]) 09/25/2013   Near syncope 09/25/2013    Mariah Milling 02/27/2021, 1:33 PM  [name] [address] [place], [place], [address] Phone: [phone]   Fax:  [phone]  Name: Walter [name] MRN: [id number] Date of Birth: 1956-03-06

## 2021-02-28 ENCOUNTER — Encounter: Payer: Self-pay | Admitting: Occupational Therapy

## 2021-02-28 ENCOUNTER — Ambulatory Visit: Payer: Medicare Other

## 2021-02-28 ENCOUNTER — Ambulatory Visit: Payer: Medicare Other | Admitting: Occupational Therapy

## 2021-02-28 ENCOUNTER — Other Ambulatory Visit: Payer: Self-pay

## 2021-02-28 DIAGNOSIS — R208 Other disturbances of skin sensation: Secondary | ICD-10-CM

## 2021-02-28 DIAGNOSIS — R41841 Cognitive communication deficit: Secondary | ICD-10-CM

## 2021-02-28 DIAGNOSIS — R5383 Other fatigue: Secondary | ICD-10-CM

## 2021-02-28 DIAGNOSIS — M6281 Muscle weakness (generalized): Secondary | ICD-10-CM

## 2021-02-28 DIAGNOSIS — R2681 Unsteadiness on feet: Secondary | ICD-10-CM

## 2021-02-28 DIAGNOSIS — R471 Dysarthria and anarthria: Secondary | ICD-10-CM | POA: Diagnosis not present

## 2021-02-28 DIAGNOSIS — I639 Cerebral infarction, unspecified: Secondary | ICD-10-CM

## 2021-02-28 DIAGNOSIS — R260 Ataxic gait: Secondary | ICD-10-CM

## 2021-02-28 DIAGNOSIS — R278 Other lack of coordination: Secondary | ICD-10-CM

## 2021-02-28 DIAGNOSIS — R270 Ataxia, unspecified: Secondary | ICD-10-CM

## 2021-02-28 DIAGNOSIS — R0683 Snoring: Secondary | ICD-10-CM

## 2021-02-28 DIAGNOSIS — R41842 Visuospatial deficit: Secondary | ICD-10-CM

## 2021-02-28 NOTE — Therapy (Signed)
[facility] [address] [place], [place], [address] Phone: [phone]   Fax:  [phone]  Speech Language Pathology Treatment/medicare progress note  Patient Details  Name: Walter [name] MRN: [id number] Date of Birth: 11-Feb-1956 Referring Provider (SLP): Charlett Blake, MD   Encounter Date: 02/28/2021   End of Session - 02/28/21 1629     Visit Number 20    Number of Visits 33    Date for SLP Re-Evaluation 04/20/21    Authorization Type Medicare    SLP Start Time 1449    SLP Stop Time  1601    SLP Time Calculation (min) 41 min    Activity Tolerance Patient tolerated treatment well             Past Medical History:  Diagnosis Date   Atrial flutter ([place])    s/p ablation   DVT (deep venous thrombosis) ([place])    Near syncope 09/25/2013   Paroxysmal atrial fibrillation ([place])    Stroke [facility])    initially hemorragic (not on [facility]) followed by subsequent embolic stroke    Past Surgical History:  Procedure Laterality Date   A-FLUTTER ABLATION N/A 03/09/2020   Procedure: A-FLUTTER ABLATION;  Surgeon: Thompson Grayer, MD;  Location: [place] CV LAB;  Service: Cardiovascular;  Laterality: N/A;   CARDIOVERSION N/A 09/27/2013   Procedure: CARDIOVERSION;  Surgeon: Lelon Perla, MD;  Location: [facility] ENDOSCOPY;  Service: Cardiovascular;  Laterality: N/A;   IR ANGIO EXTERNAL CAROTID SEL EXT CAROTID UNI R MOD SED  09/26/2020   IR ANGIO INTRA EXTRACRAN SEL COM CAROTID INNOMINATE BILAT MOD SED  09/26/2020   IR ANGIO VERTEBRAL SEL VERTEBRAL UNI R MOD SED  09/26/2020   IR IVC FILTER PLMT / S&I /IMG GUID/MOD SED  10/08/2020   IR IVUS EACH ADDITIONAL NON CORONARY VESSEL  11/02/2020   IR PTA VENOUS EXCEPT DIALYSIS CIRCUIT  11/02/2020   IR RADIOLOGIST EVAL & MGMT  01/17/2021   IR THROMBECT VENO MECH MOD SED  10/27/2020   IR THROMBECT VENO MECH MOD SED  11/02/2020   IR US GUIDE VASC ACCESS LEFT  10/27/2020   IR US GUIDE VASC ACCESS  RIGHT  09/26/2020   IR US GUIDE VASC ACCESS RIGHT  11/02/2020   IR VENO/EXT/UNI RIGHT  11/02/2020   IR VENOCAVAGRAM IVC  10/27/2020   RETINAL DETACHMENT SURGERY     right knee arthroscopy     TEE WITHOUT CARDIOVERSION N/A 09/27/2013   Procedure: TRANSESOPHAGEAL ECHOCARDIOGRAM (TEE);  Surgeon: Lelon Perla, MD;  Location: [facility] ENDOSCOPY;  Service: Cardiovascular;  Laterality: N/A;   WRIST SURGERY        Speech Therapy Progress Note  Dates of Reporting Period: 12-26-20 to present  Subjective Statement: Pt has been seen for 10 sessions targeting speech goals.   Objective: See below.  Goal Update: See below.  Plan: See below.  Reason Skilled Services are Required: Pt has not mastered goals to date.   There were no vitals filed for this visit.   Subjective Assessment - 02/28/21 1448     Subjective Pt coughed 2/4 with sips water.    Patient is accompained by: Family member   [name]-wife   Currently in Pain? No/denies                   ADULT SLP TREATMENT - 02/28/21 1511       General Information   Behavior/Cognition Alert;Cooperative;Pleasant mood;Requires cueing      Treatment Provided  Treatment provided Cognitive-Linquistic;Dysphagia      Dysphagia Treatment   Other treatment/comments SLP based on pt's performance in "s" , had pt tuck chin with water. Throat clear on 2/5 sips. Told pt to take small sips and tuck chin with all liquids. "Sip, dip, swallow." SLP explained rationale for chin tuck and had pt watch his MBSS for approx 90 seconds vs. WNL swallow on youTube. SLP explained differences and how ataxia affected his oral stage. Pt req'd SLP cues for slower rate and overarticulation during conversation about pt's swallow.      Cognitive-Linquistic Treatment   Treatment focused on Dysarthria;Cognition    Skilled Treatment Loud /a/ conducted to recalibrate volume, with average of upper 80s- low 90s db achieved; sentences (everyday) with average mid-upper 70s dB;  louder with first 3-4 than with last 6.  SLP used word problems presented auditorily for pt to provide answer in WNL volume - pt produced answers in upper 60s to low 70s db with intermittent non-verbal cues needed to sustain volume and optimize breath support.      Assessment / Recommendations / Plan   Plan Continue with current plan of care      Progression Toward Goals   Progression toward goals Progressing toward goals              SLP Education - 02/28/21 1629     Education Details oral stage difficulties, need to use chin tuck to swallow liquids    Person(s) Educated Patient;Spouse    Methods Explanation;Demonstration;Verbal cues    Comprehension Verbalized understanding;Returned demonstration;Verbal cues required;Need further instruction              SLP Short Term Goals - 02/21/21 1552       SLP SHORT TERM GOAL #1   Title Pt will complete dysarthria HEP with occasional min A over 2 sessions    Status Not Met      SLP SHORT TERM GOAL #2   Title Pt will produce loud /a/ or "hey!" with at least high 80s dB average over 2 sessions    Status Not Met      SLP SHORT TERM GOAL #3   Title Pt will generate abdominal breathing in 16/20 answers to SLP questions over 2 sessions    Baseline 01-18-21    Status Partially Met      SLP SHORT TERM GOAL #4   Title Pt will demonstrate dysarthria compensations in 10 minute simple to mod complex conversation to be 75% intelligibile with occasional min A over 2 sessions    Status Not Met      SLP SHORT TERM GOAL #5   Title Pt will complete formal cognitive linguistic assessment in first 1-2 ST sessions    Status Achieved      SLP SHORT TERM GOAL #6   Title Pt will use memory and attention compensations for appointments, medicine management, and other daily activities with occasional min A over 2 sessions    Status Not Met              SLP Long Term Goals - 02/28/21 1631       SLP LONG TERM GOAL #1   Title Pt will  complete dysarthria HEP with rare min A over 2 sessions    Time 8    Period Weeks    Status On-going    Target Date 04/20/21      SLP LONG TERM GOAL #2   Title Pt will demonstrate dysarthria compensations in  10 minute simple conversation to be 90% intelligibile with rare min A over 2 sessions    Baseline 02-19-21    Time 8    Period Weeks    Status On-going    Target Date 04/20/21      SLP LONG TERM GOAL #3   Title Pt will demo improved sustained and selective attention during therapeutic tasks with less than 3 cues for active listening during 2 sessions    Time 1    Period Weeks    Status Deferred      SLP LONG TERM GOAL #4   Title Pt will demonstrate emergent and anticipatory awareness of impulsivity with less than 5 cues to modify behaviors during 2 sessions    Status Not Met      SLP LONG TERM GOAL #5   Title Pt will report reduced frustration and improved communication effectiveness via PROM by 2 points at last ST sessions    Baseline CES: 15.5 & V-RQOL:35    Time 8    Period Weeks    Status On-going    Target Date 04/20/21      SLP LONG TERM GOAL #6   Title Pt will selectively attend to cognitive linguistic task for 5 minutes with 3 or less cues for redirection over 3 sessions    Time 8    Period Weeks    Status On-going    Target Date 04/20/21              Plan - 02/28/21 1630     Clinical Impression Statement "[name]" presents for OPST intervention secondary to stroke in March 2022. Pt worked with SLP continuing in training pt in breath support and compensations for audible, intelligible speech. Pt demonstrated coughing today with liquids and SLP did diagnostic therapy revealing pt is safer with SMALLER SIPS LIQUID USING A CHIN TUCK POSTURE. Pt presents wtih improving accuracy on structured speech tasks with some improvement in carryover at conversational level noted this session. Skilled ST is cont'd warranted to address dysarthria and cognition to maximize  communication effectiveness and optimize functional independence and safety at home. He continues to demonstrate reduced selective attention, processing and problem solving. Continue skilled ST for cognition as well as carryover of audible intelligible speech across settings.    Speech Therapy Frequency 2x / week    Duration --   16 weeks   Treatment/Interventions Cueing hierarchy;Functional tasks;Patient/family education;Cognitive reorganization;Multimodal communcation approach;Language facilitation;Compensatory techniques;Internal/external aids;SLP instruction and feedback    Potential to Achieve Goals Fair    Potential Considerations Ability to learn/carryover information;Cooperation/participation level    SLP Home Exercise Plan provided    Consulted and Agree with Plan of Care Patient             Patient will benefit from skilled therapeutic intervention in order to improve the following deficits and impairments:   Dysarthria and anarthria  Cognitive communication deficit    Problem List Patient Active Problem List   Diagnosis Date Noted   Thrombosis    Sleep disturbance    Dysphagia, post-stroke    PAF (paroxysmal atrial fibrillation) (H[facility])    Acute pulmonary embolism without acute cor pulmonale (HCC)    Malnutrition of moderate degree 11/04/2020   Tracheostomy care ([place])    Pressure injury of skin 10/23/2020   Intracranial hemorrhage (H[facility])    Atrial fibrillation ([place])    Prediabetes    Leukocytosis    Acute blood loss anemia    Hypernatremia    ICH (intracerebral  hemorrhage) ([place]) 09/25/2020   Unilateral primary osteoarthritis, left knee 09/26/2016   Atrial flutter ([place]) 09/25/2013   Near syncope 09/25/2013    [facility] ,[place], CCC-SLP  02/28/2021, 4:33 PM  [facility] [address] [place], [place], [address] Phone: [phone]   Fax:  [phone]   Name: MATHIAS BOGACKI MRN: [id number] Date of  Birth: [birth year]

## 2021-02-28 NOTE — Patient Instructions (Signed)
   Sip, dip, and swallow with SMALL SIPS from now on!

## 2021-02-28 NOTE — Therapy (Signed)
[facility] [address] [place], [place], [address] Phone: [phone]   Fax:  [phone]  Occupational Therapy Treatment  Patient Details  Name: Walter [name] MRN: [id number] Date of Birth: October 20, 1955 Referring Provider (OT): Dr. Alysia Penna   Encounter Date: 02/28/2021   OT End of Session - 02/28/21 1513     Visit Number 18    Number of Visits 27    Date for OT Re-Evaluation 04/27/21    Authorization Type Medicare Part A & B    Authorization - Visit Number 18    Authorization - Number of Visits 20    Progress Note Due on Visit 67    OT Start Time 1315    OT Stop Time 1400    OT Time Calculation (min) 45 min    Activity Tolerance Patient tolerated treatment well    Behavior During Therapy [facility] for tasks assessed/performed             Past Medical History:  Diagnosis Date   Atrial flutter ([place])    s/p ablation   DVT (deep venous thrombosis) ([place])    Near syncope 09/25/2013   Paroxysmal atrial fibrillation ([place])    Stroke ([place])    initially hemorragic (not on [place]) followed by subsequent embolic stroke    Past Surgical History:  Procedure Laterality Date   A-FLUTTER ABLATION N/A 03/09/2020   Procedure: A-FLUTTER ABLATION;  Surgeon: Thompson Grayer, MD;  Location: [place] CV LAB;  Service: Cardiovascular;  Laterality: N/A;   CARDIOVERSION N/A 09/27/2013   Procedure: CARDIOVERSION;  Surgeon: Lelon Perla, MD;  Location: [facility] ENDOSCOPY;  Service: Cardiovascular;  Laterality: N/A;   IR ANGIO EXTERNAL CAROTID SEL EXT CAROTID UNI R MOD SED  09/26/2020   IR ANGIO INTRA EXTRACRAN SEL COM CAROTID INNOMINATE BILAT MOD SED  09/26/2020   IR ANGIO VERTEBRAL SEL VERTEBRAL UNI R MOD SED  09/26/2020   IR IVC FILTER PLMT / S&I /IMG GUID/MOD SED  10/08/2020   IR IVUS EACH ADDITIONAL NON CORONARY VESSEL  11/02/2020   IR PTA VENOUS EXCEPT DIALYSIS CIRCUIT  11/02/2020   IR RADIOLOGIST EVAL & MGMT  01/17/2021   IR  THROMBECT VENO MECH MOD SED  10/27/2020   IR THROMBECT VENO MECH MOD SED  11/02/2020   IR US GUIDE VASC ACCESS LEFT  10/27/2020   IR US GUIDE VASC ACCESS RIGHT  09/26/2020   IR US GUIDE VASC ACCESS RIGHT  11/02/2020   IR VENO/EXT/UNI RIGHT  11/02/2020   IR VENOCAVAGRAM IVC  10/27/2020   RETINAL DETACHMENT SURGERY     right knee arthroscopy     TEE WITHOUT CARDIOVERSION N/A 09/27/2013   Procedure: TRANSESOPHAGEAL ECHOCARDIOGRAM (TEE);  Surgeon: Lelon Perla, MD;  Location: [facility] ENDOSCOPY;  Service: Cardiovascular;  Laterality: N/A;   WRIST SURGERY      There were no vitals filed for this visit.   Subjective Assessment - 02/28/21 1355     Subjective  I am going to wear my swim suit underneath my clothes so I can get to the pool if possible    Pertinent History ICH of brainstem (complicated by hydrocephalus, DVTs, and tracheostomy).  PMH:  a-fib    Limitations fall risk, ataxia, impulsivity    Patient Stated Goals use L side of body better (decrease ataxia), stay active and heal brain    Currently in Pain? No/denies    Pain Score 0-No pain  OT Treatments/Exercises (OP) - 02/28/21 0001       Neurological Re-education Exercises   Other Exercises 1 Neuromuscular reed to address postural control during transitional movements - sit to stand, stand to sit, dynamic stand balance.  Patient with decreased sensation RUE, and ataxia LUE.  Worked on reach and grasp patterns in each UE.   Dynamic standing - reaching forward, reaching forward with small controlled step, reaching high, low, left, right, with rotation.  Patient does best with slow speed for all movment to demonstrate improved control.                      OT Short Term Goals - 02/28/21 1515       OT SHORT TERM GOAL #1   Title Pt/caregiver will be independent with initial HEP for LUE coordination and R hand strength.--    Time 4    Period Weeks    Status Achieved   needs continued  reinforcement, pt is not using consistently   Target Date 03/02/21      OT SHORT TERM GOAL #2   Title Pt/caregiver will be independent with vision HEP and visual compensation strategies.    Time 4    Period Weeks    Status On-going   Pt/ family verbalize understanding of vision HEP, needs continued reinforcement of compensatory strategeis- pt is not performing head turns     OT SHORT TERM GOAL #3   Title Pt will improve LUE coordination for ADLs as shown by improving score on box and blocks test by at least 6.    Baseline L-21 blocks    Time 4    Period Weeks    Status On-going   25 blocks     OT SHORT TERM GOAL #4   Title Pt will perform simple snack prep/home maintenance task from w/c level mod I.    Time 4    Period Weeks    Status Achieved   met per pt's wife     OT SHORT TERM GOAL #5   Title Pt will verbalize understanding of compensation strategies for sensory deficts for incr safety.    Time 4    Period Weeks    Status On-going   Pt verbalized that he has to be careful about temperature, needs additional reinforcement     OT SHORT TERM GOAL #6   Title Pt will improve R hand grip stength to at least 32lbs to assist with ADLs/opening containers.    Time 4    Period Weeks    Status Achieved   33.5     OT SHORT TERM GOAL #7   Title --               OT Long Term Goals - 02/28/21 1515       OT LONG TERM GOAL #1   Title Pt will be independent with updated HEP--    Time 12    Period Weeks    Status On-going      OT LONG TERM GOAL #2   Title Pt will improve L hand coordination for ADLs as shown by completing 9-hole peg test in less than 70sec.    Baseline 107sec    Time 12    Period Weeks    Status On-going      OT LONG TERM GOAL #3   Title Pt will be mod I with toileting.    Time 12    Period Weeks    Status  On-going      OT LONG TERM GOAL #4   Title Pt will perform simple snack prep/home maintenance tasks in standing with supervision.    Time 12     Period Weeks    Status On-going      OT LONG TERM GOAL #5   Title Pt will demo good safety awareness/compensation for sensory deficits and attention to LUE during functional tasks and transfers without cueing.    Time 12    Period Weeks    Status On-going      OT LONG TERM GOAL #6   Title Pt will perform simple environmental scanning/navigation with at least 90% accuracy for incr safety.    Time 12    Period Weeks    Status On-going      OT LONG TERM GOAL #7   Title Pt will perform all basic ADLS with no more than supervision.    Time 12    Period Weeks    Status New                   Plan - 02/28/21 1514     Clinical Impression Statement Pt with multisystem impairment, and is showing steady improvement overall.    OT Occupational Profile and History Detailed Assessment- Review of Records and additional review of physical, cognitive, psychosocial history related to current functional performance    Occupational performance deficits (Please refer to evaluation for details): ADL's;IADL's;Work;Leisure;Social Participation    Body Structure / Function / Physical Skills ADL;Strength;Balance;Proprioception;UE functional use;IADL;Endurance;Vision;Mobility;Coordination;Decreased knowledge of precautions;FMC;GMC;Sensation    Cognitive Skills Attention;Safety Awareness;Memory;Perception;Problem Solve    Rehab Potential Good    Clinical Decision Making Several treatment options, min-mod task modification necessary    Comorbidities Affecting Occupational Performance: May have comorbidities impacting occupational performance    Modification or Assistance to Complete Evaluation  Min-Moderate modification of tasks or assist with assess necessary to complete eval    OT Frequency 3x / week   updated frequency may decrease after 4 weeks   OT Duration 12 weeks   written for 12 weeks however may d/c after 8 depenent upon progress.   OT Treatment/Interventions Self-care/ADL training;Moist  Heat;Fluidtherapy;DME and/or AE instruction;Balance training;Therapeutic activities;Aquatic Therapy;Ultrasound;Therapeutic exercise;Cognitive remediation/compensation;Visual/perceptual remediation/compensation;Functional Mobility Training;Neuromuscular education;Cryotherapy;Energy conservation;Manual Therapy;Patient/family education    Plan , functional reaching, standing balance, UE control    Consulted and Agree with Plan of Care Family member/caregiver;Patient    Family Member Consulted wife             Patient will benefit from skilled therapeutic intervention in order to improve the following deficits and impairments:   Body Structure / Function / Physical Skills: ADL, Strength, Balance, Proprioception, UE functional use, IADL, Endurance, Vision, Mobility, Coordination, Decreased knowledge of precautions, FMC, GMC, Sensation Cognitive Skills: Attention, Safety Awareness, Memory, Perception, Problem Solve     Visit Diagnosis: Ataxia  Visuospatial deficit  Other disturbances of skin sensation  Muscle weakness (generalized)  Unsteadiness on feet  Other lack of coordination    Problem List Patient Active Problem List   Diagnosis Date Noted   Thrombosis    Sleep disturbance    Dysphagia, post-stroke    PAF (paroxysmal atrial fibrillation) (HCC)    Acute pulmonary embolism without acute cor pulmonale (HCC)    Malnutrition of moderate degree 11/04/2020   Tracheostomy care ([place])    Pressure injury of skin 10/23/2020   Intracranial hemorrhage (HCC)    Atrial fibrillation (HCC)    Prediabetes    Leukocytosis  Acute blood loss anemia    Hypernatremia    ICH (intracerebral hemorrhage) ([place]) 09/25/2020   Unilateral primary osteoarthritis, left knee 09/26/2016   Atrial flutter ([place]) 09/25/2013   Near syncope 09/25/2013    Mariah Milling 02/28/2021, 3:16 PM  [facility] [address] [place],  [place], [address] Phone: [phone]   Fax:  [phone]  Name: Walter [name] MRN: [id number] Date of Birth: May 09, 1956

## 2021-02-28 NOTE — Therapy (Signed)
OUTPATIENT PHYSICAL THERAPY TREATMENT NOTE  Patient Name: Walter [name] MRN: [id number] DOB:1956-03-09, 65 y.o., male Today's Date: 02/28/2021  PCP: Alroy Dust, L.Marlou Sa, MD REFERRING PROVIDER: Alroy Dust, Carlean Jews.Marlou Sa, MD   PT End of Session - 02/28/21 1419     Visit Number 18    Date for PT Re-Evaluation 04/26/21    Authorization Type Medicare 12/26/20 to 02/20/21; Recert 1/75/10 to 25/85/27    Progress Note Due on Visit 20    PT Start Time 1400    PT Stop Time 1445    PT Time Calculation (min) 45 min    Equipment Utilized During Treatment Gait belt    Activity Tolerance Patient tolerated treatment well    Behavior During Therapy WFL for tasks assessed/performed                 Past Medical History:  Diagnosis Date   Atrial flutter ([place])    s/p ablation   DVT (deep venous thrombosis) (HCC)    Near syncope 09/25/2013   Paroxysmal atrial fibrillation (HCC)    Stroke [facility])    initially hemorragic (not on [place]) followed by subsequent embolic stroke   Past Surgical History:  Procedure Laterality Date   A-FLUTTER ABLATION N/A 03/09/2020   Procedure: A-FLUTTER ABLATION;  Surgeon: Thompson Grayer, MD;  Location: [place] CV LAB;  Service: Cardiovascular;  Laterality: N/A;   CARDIOVERSION N/A 09/27/2013   Procedure: CARDIOVERSION;  Surgeon: Lelon Perla, MD;  Location: [facility] ENDOSCOPY;  Service: Cardiovascular;  Laterality: N/A;   IR ANGIO EXTERNAL CAROTID SEL EXT CAROTID UNI R MOD SED  09/26/2020   IR ANGIO INTRA EXTRACRAN SEL COM CAROTID INNOMINATE BILAT MOD SED  09/26/2020   IR ANGIO VERTEBRAL SEL VERTEBRAL UNI R MOD SED  09/26/2020   IR IVC FILTER PLMT / S&I /IMG GUID/MOD SED  10/08/2020   IR IVUS EACH ADDITIONAL NON CORONARY VESSEL  11/02/2020   IR PTA VENOUS EXCEPT DIALYSIS CIRCUIT  11/02/2020   IR RADIOLOGIST EVAL & MGMT  01/17/2021   IR THROMBECT VENO MECH MOD SED  10/27/2020   IR THROMBECT VENO MECH MOD SED  11/02/2020   IR US GUIDE VASC ACCESS LEFT  10/27/2020   IR US GUIDE VASC  ACCESS RIGHT  09/26/2020   IR US GUIDE VASC ACCESS RIGHT  11/02/2020   IR VENO/EXT/UNI RIGHT  11/02/2020   IR VENOCAVAGRAM IVC  10/27/2020   RETINAL DETACHMENT SURGERY     right knee arthroscopy     TEE WITHOUT CARDIOVERSION N/A 09/27/2013   Procedure: TRANSESOPHAGEAL ECHOCARDIOGRAM (TEE);  Surgeon: Lelon Perla, MD;  Location: [facility] ENDOSCOPY;  Service: Cardiovascular;  Laterality: N/A;   WRIST SURGERY     Patient Active Problem List   Diagnosis Date Noted   Thrombosis    Sleep disturbance    Dysphagia, post-stroke    PAF (paroxysmal atrial fibrillation) ([place])    Acute pulmonary embolism without acute cor pulmonale (HCC)    Malnutrition of moderate degree 11/04/2020   Tracheostomy care ([place])    Pressure injury of skin 10/23/2020   Intracranial hemorrhage (HCC)    Atrial fibrillation (HCC)    Prediabetes    Leukocytosis    Acute blood loss anemia    Hypernatremia    ICH (intracerebral hemorrhage) ([place]) 09/25/2020   Unilateral primary osteoarthritis, left knee 09/26/2016   Atrial flutter ([place]) 09/25/2013   Near syncope 09/25/2013    REFERRING DIAG: I61.3 (ICD-10-CM) - 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 ([place]) I69.193 (ICD-10-CM) - 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 Pt reports no new falls but reports of few stumbles at home. Pt walked into therapy with walker instead of wheelchair.  Objective:   TODAY'S TREATMENT:   02/28/21: Gait training: 1 x 460 feet with tactile cues at pelvis to guide weight shifts cues for slower cadence and turning slowly Standing on floor: OH reaching and fwd reaching: alternating between reaches: 10x each Standing touching toes: 5x Gait training:  chair to chair transfer with 180 deg turn: quad cane, CGA to min A: 4x  02/26/21: Gait training: 1 x 420 feet with tactile cues at pelvis to guide weight shifts cues for slower cadence and turning slowly Sit to stand: 10x no HHA Standing at countertop with wide BOS: moving basket with 20lbs from side to side to increase core engagement and weight shifts Picking up basket with 20lb from counter to chest: 5x Working on ankle strategy: standing heel and toe raises without HHA: very close guarding: 2 x 10  Standing contralateral reaching: 10x R and L Walking with one walking stick in R UE and min A from PT, cueing to slow down with walking and controlling step length in L LE. 1 x 60 feet  02/22/21:                 Seated hip/shoulder tosses, chops and Vs with 3.3# ball 10x Seated OH reach with 3.3# ball with inspiration on lift Seated LAQs alternating with latissimus press to facilitate seated trunk control, 15x per LE Seated LE marching alternating with latissimus press to facilitate seated trunk control, 15x per LE Seated heel slides alternating with latissimus press to facilitate seated trunk control, 15x per LE Standing and stepping alternating using foam roll to maintain midline with Min/Mod assist from PT Standing and performing OH lift with ball, 10x with inspiration, repeated with tandem stance 10 more times Nustep arms 10 L4 8' seat 12      PATIENT EDUCATION: Education details: Side stepping with RW through narrow spaces Person educated: Patient and caregiver Education method: Explanation, Demonstration, and Verbal cues Education comprehension: verbalized understanding     HOME EXERCISE PROGRAM: Continue daily walking with CG assist  ASSESSMENT:   CLINICAL IMPRESSION: Pt progressed to LRAD for PT sessions only to quad cane. Pt able to walk short distances with min A still required. Pt needs max cueing to take smaller steps and cane placement with quad cane. Mod cueing required  when walking with walker. Overall pt's static standing balance with functional reaching is improving.  REHAB POTENTIAL: Good   CLINICAL DECISION MAKING: Stable/uncomplicated  GOALS: Goals reviewed with patient? Yes   SHORT TERM GOALS:   STG Name Target Date Goal status  1 Patient will be able to perform chair to chair transfer with CGA/SBA with use of RW for safety to improve independence. Baseline: min to mod A (12/26/20) 01/23/2021 02/22/21 Able to transfer STS and stand pivot with SBA/CGA  2 Pt will be able to ambulate 115' with RW with CGA to min A with RW for safety to improve independence Baseline: min to mod A with RW 145' 01/23/2021 02/05/21 Ambulation of 148fx2 with RW and CGA/minA   3 Pt will be able to maintain standing balance for 30 sec to improve  static balance Baseline: 10 sec before needing help due to LOB (12/26/20) 01/23/2021 02/05/21 able to stand with S for 30s, goal met    LONG TERM GOALS:   LTG Name Target Date Goal status  1 Pt will demo >5 points improvement on Berg Balance Scale to improve static balance Baseline: 11/56 01/03/21 02/20/2021 INITIAL  2 Pt will be able to ambulate >500 feet with RW and SBA to improve walking endurance and safety. Baseline: 145' with RW min to mod A 02/20/2021 INITIAL  3 Pt will be able to perform gait speed improvement by 0.34ms to improve community ambulation. Baseline:TBD 02/20/2021 INITIAL  4 Pt will demo 5 sec improvement in 5x sit to stand (with or without UE support) to improve functional strength Baseline: TBD 02/20/2021 INITIAL    PLAN: PT FREQUENCY: 2x/week   PT DURATION: 8 weeks   PLANNED INTERVENTIONS: Therapeutic exercises, Therapeutic activity, Neuro Muscular re-education, Balance training, Gait training, Patient/Family education, Joint mobilization, Stair training, Visual/preceptual remediation/compensation, Wheelchair mobility training, Cryotherapy, Moist heat, and Manual therapy   PLAN FOR NEXT SESSION: Standing/walking  tasks in // bars, floor targets and stepping tasks, higher level gait training-stepping over and around obstacles, head and trunk righting, STGs check, sitting balance tasks, BERG, gait speed and 5x STS    Visit Diagnosis: Unsteadiness on feet  Muscle weakness (generalized)  Ataxic gait  Cerebrovascular accident (CVA), unspecified mechanism ([place]     Problem List Patient Active Problem List   Diagnosis Date Noted   Thrombosis    Sleep disturbance    Dysphagia, post-stroke    PAF (paroxysmal atrial fibrillation) ([place]    Acute pulmonary embolism without acute cor pulmonale ([place]    Malnutrition of moderate degree 11/04/2020   Tracheostomy care ([place]    Pressure injury of skin 10/23/2020   Intracranial hemorrhage (HCC)    Atrial fibrillation ([place]    Prediabetes    Leukocytosis    Acute blood loss anemia    Hypernatremia    ICH (intracerebral hemorrhage) ([place] 09/25/2020   Unilateral primary osteoarthritis, left knee 09/26/2016   Atrial flutter ([place] 09/25/2013   Near syncope 09/25/2013     KMarkus [name] PT 02/28/21    2:22 PM    [place] [address]Phone: [phone]  Fax:  [phone] Name: Walter HESLOPMRN: [id number] of Birth: 305/31/1957

## 2021-03-01 ENCOUNTER — Encounter (HOSPITAL_BASED_OUTPATIENT_CLINIC_OR_DEPARTMENT_OTHER): Payer: Self-pay | Admitting: Physical Therapy

## 2021-03-01 ENCOUNTER — Ambulatory Visit (HOSPITAL_BASED_OUTPATIENT_CLINIC_OR_DEPARTMENT_OTHER): Payer: Medicare Other | Attending: Physical Medicine & Rehabilitation | Admitting: Physical Therapy

## 2021-03-01 ENCOUNTER — Encounter: Payer: Medicare Other | Admitting: Occupational Therapy

## 2021-03-01 ENCOUNTER — Ambulatory Visit: Payer: Medicare Other

## 2021-03-01 DIAGNOSIS — R471 Dysarthria and anarthria: Secondary | ICD-10-CM | POA: Diagnosis present

## 2021-03-01 DIAGNOSIS — R41842 Visuospatial deficit: Secondary | ICD-10-CM | POA: Insufficient documentation

## 2021-03-01 DIAGNOSIS — I639 Cerebral infarction, unspecified: Secondary | ICD-10-CM | POA: Insufficient documentation

## 2021-03-01 DIAGNOSIS — R2681 Unsteadiness on feet: Secondary | ICD-10-CM | POA: Insufficient documentation

## 2021-03-01 DIAGNOSIS — R270 Ataxia, unspecified: Secondary | ICD-10-CM | POA: Diagnosis present

## 2021-03-01 DIAGNOSIS — M6281 Muscle weakness (generalized): Secondary | ICD-10-CM | POA: Insufficient documentation

## 2021-03-01 DIAGNOSIS — R41841 Cognitive communication deficit: Secondary | ICD-10-CM | POA: Diagnosis present

## 2021-03-01 DIAGNOSIS — R41844 Frontal lobe and executive function deficit: Secondary | ICD-10-CM | POA: Insufficient documentation

## 2021-03-01 DIAGNOSIS — R260 Ataxic gait: Secondary | ICD-10-CM | POA: Diagnosis present

## 2021-03-01 DIAGNOSIS — R208 Other disturbances of skin sensation: Secondary | ICD-10-CM | POA: Insufficient documentation

## 2021-03-01 DIAGNOSIS — R278 Other lack of coordination: Secondary | ICD-10-CM | POA: Diagnosis present

## 2021-03-01 NOTE — Therapy (Signed)
OUTPATIENT PHYSICAL THERAPY TREATMENT NOTE  Patient Name: Walter [name] MRN: [id number] DOB:Jun 10, 1956, 65 y.o., male Today's Date: 03/01/2021  PCP: Alroy Dust, L.Marlou Sa, MD REFERRING PROVIDER: Charlett Blake, MD   PT End of Session - 03/01/21 1406     Visit Number 19    Number of Visits 17    Date for PT Re-Evaluation 04/26/21    Authorization Type Medicare 12/26/20 to 02/20/21; Recert 08/01/98 to 71/21/97    Progress Note Due on Visit 20    PT Start Time 1201    PT Stop Time 1245    PT Time Calculation (min) 44 min    Equipment Utilized During Treatment Gait belt;Other (comment)   Ankle cuff and waist buoys, kick board, noodle/sqoodle, nekdoodle, weights and barbells, water walker   Activity Tolerance Patient tolerated treatment well    Behavior During Therapy WFL for tasks assessed/performed                  Past Medical History:  Diagnosis Date   Atrial flutter ([place])    s/p ablation   DVT (deep venous thrombosis) ([place])    Near syncope 09/25/2013   Paroxysmal atrial fibrillation (HCC)    Stroke [facility])    initially hemorragic (not on [place]) followed by subsequent embolic stroke   Past Surgical History:  Procedure Laterality Date   A-FLUTTER ABLATION N/A 03/09/2020   Procedure: A-FLUTTER ABLATION;  Surgeon: Thompson Grayer, MD;  Location: [place] CV LAB;  Service: Cardiovascular;  Laterality: N/A;   CARDIOVERSION N/A 09/27/2013   Procedure: CARDIOVERSION;  Surgeon: Lelon Perla, MD;  Location: [facility] ENDOSCOPY;  Service: Cardiovascular;  Laterality: N/A;   IR ANGIO EXTERNAL CAROTID SEL EXT CAROTID UNI R MOD SED  09/26/2020   IR ANGIO INTRA EXTRACRAN SEL COM CAROTID INNOMINATE BILAT MOD SED  09/26/2020   IR ANGIO VERTEBRAL SEL VERTEBRAL UNI R MOD SED  09/26/2020   IR IVC FILTER PLMT / S&I /IMG GUID/MOD SED  10/08/2020   IR IVUS EACH ADDITIONAL NON CORONARY VESSEL  11/02/2020   IR PTA VENOUS EXCEPT DIALYSIS CIRCUIT  11/02/2020   IR RADIOLOGIST EVAL & MGMT  01/17/2021   IR  THROMBECT VENO MECH MOD SED  10/27/2020   IR THROMBECT VENO MECH MOD SED  11/02/2020   IR US GUIDE VASC ACCESS LEFT  10/27/2020   IR US GUIDE VASC ACCESS RIGHT  09/26/2020   IR US GUIDE VASC ACCESS RIGHT  11/02/2020   IR VENO/EXT/UNI RIGHT  11/02/2020   IR VENOCAVAGRAM IVC  10/27/2020   RETINAL DETACHMENT SURGERY     right knee arthroscopy     TEE WITHOUT CARDIOVERSION N/A 09/27/2013   Procedure: TRANSESOPHAGEAL ECHOCARDIOGRAM (TEE);  Surgeon: Lelon Perla, MD;  Location: [facility] ENDOSCOPY;  Service: Cardiovascular;  Laterality: N/A;   WRIST SURGERY     Patient Active Problem List   Diagnosis Date Noted   Thrombosis    Sleep disturbance    Dysphagia, post-stroke    PAF (paroxysmal atrial fibrillation) ([place])    Acute pulmonary embolism without acute cor pulmonale (HCC)    Malnutrition of moderate degree 11/04/2020   Tracheostomy care ([place])    Pressure injury of skin 10/23/2020   Intracranial hemorrhage (HCC)    Atrial fibrillation ([place])    Prediabetes    Leukocytosis    Acute blood loss anemia    Hypernatremia    ICH (intracerebral hemorrhage) ([place]) 09/25/2020   Unilateral primary osteoarthritis, left knee 09/26/2016   Atrial flutter ([place]) 09/25/2013  Near syncope 09/25/2013    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  Treatment took place in water 3.25-4.8 ft  in depth at the [facility] pool. Temp of water was 92. entered/exited the pool via stairs step to pattern independently with bilat rail.  Introduced pt to aquatic setting.                           Amb using water walker. forward, backward and side stepping/walking cues for increased step length, posture and weight shifting x 12 minutes. Pt requiring mod to min asst. Multiple LOB.  Seated -sit to stand transfers from water bench submerged to waist when standing.  Cueing for weight shift/execution. Initally completed with water walker, progressed to thick yellow noodle.  Pt requiring min-cga for completion. 2 x 5 Marching x 10 LAQ x 10.  Standing -step ups x leading with L/R LE.  Cueing for decreased impulsiveness /improved concentration on finishing first movement before moving to next. -toe raises 2 x 10 Cueing for balance/slowing movements  Supine suspension Flutter kicking x 5 min supported by Pt  Pt requires buoyancy for support and to offload joints with strengthening exercises. Viscosity of the water is needed for resistance of strengthening; water current perturbations provides challenge to standing balance unsupported, requiring increased core activation.   02/28/21: Gait training: 1 x 460 feet with tactile cues at pelvis to guide weight shifts cues for slower cadence and turning slowly Standing on floor: OH reaching and fwd reaching: alternating between reaches: 10x each Standing touching toes: 5x Gait training: chair to chair transfer with 180 deg turn: quad cane, CGA to min A: 4x  02/26/21: Gait training: 1 x 420 feet with tactile cues at pelvis to guide weight shifts cues for slower cadence and turning slowly Sit to stand: 10x no HHA Standing at countertop with wide BOS: moving basket with 20lbs from side to side to increase core engagement and weight shifts Picking up basket with 20lb from counter to chest: 5x Working on ankle strategy: standing heel and toe  raises without HHA: very close guarding: 2 x 10  Standing contralateral reaching: 10x R and L Walking with one walking stick in R UE and min A from PT, cueing to slow down with walking and controlling step length in L LE. 1 x 60 feet  02/22/21:                 Seated hip/shoulder tosses, chops and Vs with 3.3# ball 10x Seated OH reach with 3.3# ball with inspiration on lift Seated LAQs alternating with latissimus press to facilitate seated trunk control, 15x per LE Seated LE marching alternating with latissimus press to facilitate seated trunk control, 15x per LE Seated heel slides alternating with latissimus press to facilitate seated trunk control, 15x per LE Standing and stepping alternating using foam roll to maintain midline with Min/Mod assist from PT Standing and performing  OH lift with ball, 10x with inspiration, repeated with tandem stance 10 more times Nustep arms 10 L4 8' seat 12      PATIENT EDUCATION: Education details: Properties of water/benefits of aquatic therapy. Person educated: Patient and caregiver Education method: Explanation, Demonstration, and Verbal cues Education comprehension: verbalized understanding     HOME EXERCISE PROGRAM: Continue daily walking with CG assist  ASSESSMENT:   CLINICAL IMPRESSION:  Pt with some impulsiveness throughout session. Possibly due to his excitement for session in pool. No apprehension noted. Required constant cueing for erect posture as using water walker allows for upright positioning rather than forward flexed. Pt with some difficulty controlling body in environment due to buoyancy. He did stay engaged and moving entire session with few rest periods, fatigued upon completion.   Entered and exit via steps and handrail cga.   REHAB POTENTIAL: Good   CLINICAL DECISION MAKING: Stable/uncomplicated  GOALS: Goals reviewed with patient? Yes   SHORT TERM GOALS:   STG Name Target Date Goal status  1 Patient will be able to  perform chair to chair transfer with CGA/SBA with use of RW for safety to improve independence. Baseline: min to mod A (12/26/20) 01/23/2021 02/22/21 Able to transfer STS and stand pivot with SBA/CGA  2 Pt will be able to ambulate 115' with RW with CGA to min A with RW for safety to improve independence Baseline: min to mod A with RW 145' 01/23/2021 02/05/21 Ambulation of 126fx2 with RW and CGA/minA   3 Pt will be able to maintain standing balance for 30 sec to improve static balance Baseline: 10 sec before needing help due to LOB (12/26/20) 01/23/2021 02/05/21 able to stand with S for 30s, goal met    LONG TERM GOALS:   LTG Name Target Date Goal status  1 Pt will demo >5 points improvement on Berg Balance Scale to improve static balance Baseline: 11/56 01/03/21 02/20/2021 INITIAL  2 Pt will be able to ambulate >500 feet with RW and SBA to improve walking endurance and safety. Baseline: 145' with RW min to mod A 02/20/2021 INITIAL  3 Pt will be able to perform gait speed improvement by 0.171m to improve community ambulation. Baseline:TBD 02/20/2021 INITIAL  4 Pt will demo 5 sec improvement in 5x sit to stand (with or without UE support) to improve functional strength Baseline: TBD 02/20/2021 INITIAL    PLAN: PT FREQUENCY: 2x/week   PT DURATION: 8 weeks   PLANNED INTERVENTIONS: Therapeutic exercises, Therapeutic activity, Neuro Muscular re-education, Balance training, Gait training, Patient/Family education, Joint mobilization, Stair training, Visual/preceptual remediation/compensation, Wheelchair mobility training, Cryotherapy, Moist heat, and Manual therapy   PLAN FOR NEXT SESSION:  Aquatics: balance challenges progressed with core strengthening. Standing/walking tasks in // bars, floor targets and stepping tasks, higher level gait training-stepping over and around obstacles, head and trunk righting, STGs check, sitting balance tasks, BERG, gait speed and 5x STS    Visit  Diagnosis: Ataxia  Visuospatial deficit  Other disturbances of skin sensation  Unsteadiness on feet  Muscle weakness (generalized)  Dysarthria and anarthria  Cerebrovascular accident (CVA), unspecified mechanism ([place] Frontal lobe and executive function deficit  Ataxic gait     Problem List Patient Active Problem List   Diagnosis Date Noted   Thrombosis    Sleep disturbance    Dysphagia, post-stroke    PAF (paroxysmal atrial fibrillation) ([place]   Acute pulmonary embolism without acute cor pulmonale (HCC)    Malnutrition of moderate degree 11/04/2020  Tracheostomy care ([place])    Pressure injury of skin 10/23/2020   Intracranial hemorrhage (HCC)    Atrial fibrillation (HCC)    Prediabetes    Leukocytosis    Acute blood loss anemia    Hypernatremia    ICH (intracerebral hemorrhage) ([place]) 09/25/2020   Unilateral primary osteoarthritis, left knee 09/26/2016   Atrial flutter ([place]) 09/25/2013   Near syncope 09/25/2013    Annamarie Major) Kerim Statzer MPT  03/01/21    3:15 PM    [facility] [place], [place], [address] Phone: [phone]   Fax:  [phone]  Name: BRAXTEN MEMMER MRN: [id number] Date of Birth: 06/19/1956   [facility] [address] [place], [place], [address] Phone: [phone]   Fax:  [phone]  Patient Details  Name: ANCELMO HUNT MRN: [id number] Date of Birth: [birth year] Referring Provider:  Charlett Blake, MD  Encounter Date: 03/01/2021   Vedia Pereyra 03/01/2021, 3:15 PM  [facility] [place] [address] [place], [place], [address] Phone: [phone]   Fax:  [phone]

## 2021-03-06 ENCOUNTER — Ambulatory Visit: Payer: Medicare Other

## 2021-03-06 ENCOUNTER — Ambulatory Visit: Payer: Medicare Other | Admitting: Occupational Therapy

## 2021-03-07 ENCOUNTER — Telehealth: Payer: Self-pay | Admitting: *Deleted

## 2021-03-07 NOTE — Telephone Encounter (Signed)
Pt and his wife Lynden Ang came in the office today for the pt to be set up with [facility]. I have gone over the instructional tutorial with the pt and his wife. Both the pt and his wife are aware once the study has been approved I will call back with the PIN #. Both are aware once the study is completed and has been read by the cardiologist we will call with the results and recommendations if any. I will send a message to our sleep pool for PA.   [name] asked to call her cell # [phone]

## 2021-03-07 NOTE — Telephone Encounter (Signed)
Patient Name: Walter [name]        DOB: 05/28/2056      Height: 6'    Weight: 190  Office Name: [facility]         Referring Provider: DR. Johney Frame  Today's Date: 03/07/21  Date:   STOP BANG RISK ASSESSMENT S (snore) Have you been told that you snore?     YES   T (tired) Are you often tired, fatigued, or sleepy during the day?   YES  O (obstruction) Do you stop breathing, choke, or gasp during sleep? YES   P (pressure) Do you have or are you being treated for high blood pressure? NO   B (BMI) Is your body index greater than 35 kg/m? NO   A (age) Are you 65 years old or older? YES   N (neck) Do you have a neck circumference greater than 16 inches?   YES   G (gender) Are you a male? YES   TOTAL STOP/BANG "YES" ANSWERS 6                                                                       For Office Use Only              Procedure Order Form    YES to 3+ Stop Bang questions OR two clinical symptoms - patient qualifies for WatchPAT (CPT 95800)             Clinical Notes: Will consult Sleep Specialist and refer for management of therapy due to patient increased risk of Sleep Apnea. Ordering a sleep study due to the following two clinical symptoms: Excessive daytime sleepiness G47.10 / Gastroesophageal reflux K21.9 / Nocturia R35.1 / Morning Headaches G44.221 / Difficulty concentrating R41.840 / Memory problems or poor judgment G31.84 / Personality changes or irritability R45.4 / Loud snoring R06.83 / Depression F32.9 / Unrefreshed by sleep G47.8 / Impotence N52.9 / History of high blood pressure R03.0 / Insomnia G47.00    I understand that I am proceeding with a home sleep apnea test as ordered by my treating physician. I understand that untreated sleep apnea is a serious cardiovascular risk factor and it is my responsibility to perform the test and seek management for sleep apnea. I will be contacted with the results and be managed for sleep apnea by a local sleep physician.  I will be receiving equipment and further instructions from [facility]. I shall promptly ship back the equipment via the included mailing label. I understand my insurance will be billed for the test and as the patient I am responsible for any insurance related out-of-pocket costs incurred. I have been provided with written instructions and can call for additional video or telephonic instruction, with 24-hour availability of qualified personnel to answer any questions: Patient Help Desk [phone].  Patient Signature ______________________________________________________   Date______________________ Patient Telemedicine Verbal Consent

## 2021-03-07 NOTE — Telephone Encounter (Signed)
RE: Walter [name] SLEEP STUDY Received: Today Gaynelle Cage, CMA  Tarri Fuller, CMA Ok to activate. No PA is required.     I s/w the pt's wife Walter [name] and she has been made aware pt is approved for [employer]. Per Walter [name], pt will do sleep study one night this week. See notes from earlier today as well for further information. PIN# [id number] given to pt's wife Walter [name].

## 2021-03-08 ENCOUNTER — Other Ambulatory Visit: Payer: Self-pay

## 2021-03-08 ENCOUNTER — Encounter: Payer: Self-pay | Admitting: Occupational Therapy

## 2021-03-08 ENCOUNTER — Encounter (INDEPENDENT_AMBULATORY_CARE_PROVIDER_SITE_OTHER): Payer: Medicare Other | Admitting: Cardiology

## 2021-03-08 ENCOUNTER — Ambulatory Visit: Payer: Medicare Other | Attending: Family Medicine | Admitting: Occupational Therapy

## 2021-03-08 DIAGNOSIS — R270 Ataxia, unspecified: Secondary | ICD-10-CM | POA: Diagnosis present

## 2021-03-08 DIAGNOSIS — I639 Cerebral infarction, unspecified: Secondary | ICD-10-CM | POA: Insufficient documentation

## 2021-03-08 DIAGNOSIS — R2681 Unsteadiness on feet: Secondary | ICD-10-CM | POA: Diagnosis present

## 2021-03-08 DIAGNOSIS — R41842 Visuospatial deficit: Secondary | ICD-10-CM | POA: Diagnosis present

## 2021-03-08 DIAGNOSIS — R278 Other lack of coordination: Secondary | ICD-10-CM | POA: Insufficient documentation

## 2021-03-08 DIAGNOSIS — R208 Other disturbances of skin sensation: Secondary | ICD-10-CM | POA: Diagnosis present

## 2021-03-08 DIAGNOSIS — G4733 Obstructive sleep apnea (adult) (pediatric): Secondary | ICD-10-CM

## 2021-03-08 DIAGNOSIS — I69218 Other symptoms and signs involving cognitive functions following other nontraumatic intracranial hemorrhage: Secondary | ICD-10-CM | POA: Diagnosis present

## 2021-03-08 DIAGNOSIS — R41844 Frontal lobe and executive function deficit: Secondary | ICD-10-CM | POA: Insufficient documentation

## 2021-03-08 DIAGNOSIS — R41841 Cognitive communication deficit: Secondary | ICD-10-CM | POA: Diagnosis present

## 2021-03-08 DIAGNOSIS — R471 Dysarthria and anarthria: Secondary | ICD-10-CM | POA: Diagnosis present

## 2021-03-08 DIAGNOSIS — M6281 Muscle weakness (generalized): Secondary | ICD-10-CM | POA: Insufficient documentation

## 2021-03-08 NOTE — Therapy (Signed)
[facility] [address] [place], [place], [address] Phone: [phone]   Fax:  [phone]  Occupational Therapy Treatment  Patient Details  Name: Walter [name] MRN: [id number] Date of Birth: 1956/05/07 Referring Provider (OT): Dr. Alysia Penna   Encounter Date: 03/08/2021   OT End of Session - 03/08/21 1802     Visit Number 19    Number of Visits 64    Date for OT Re-Evaluation 04/27/21    Authorization Type Medicare Part A & B    Authorization - Visit Number 55    Authorization - Number of Visits 20    Progress Note Due on Visit 20    OT Start Time 1700    OT Stop Time 1745    OT Time Calculation (min) 45 min    Activity Tolerance Patient tolerated treatment well    Behavior During Therapy [facility] for tasks assessed/performed             Past Medical History:  Diagnosis Date   Atrial flutter ([place])    s/p ablation   DVT (deep venous thrombosis) ([place])    Near syncope 09/25/2013   Paroxysmal atrial fibrillation ([place])    Stroke ([place])    initially hemorragic (not on [place]) followed by subsequent embolic stroke    Past Surgical History:  Procedure Laterality Date   A-FLUTTER ABLATION N/A 03/09/2020   Procedure: A-FLUTTER ABLATION;  Surgeon: Thompson Grayer, MD;  Location: [place] CV LAB;  Service: Cardiovascular;  Laterality: N/A;   CARDIOVERSION N/A 09/27/2013   Procedure: CARDIOVERSION;  Surgeon: Lelon Perla, MD;  Location: [facility] ENDOSCOPY;  Service: Cardiovascular;  Laterality: N/A;   IR ANGIO EXTERNAL CAROTID SEL EXT CAROTID UNI R MOD SED  09/26/2020   IR ANGIO INTRA EXTRACRAN SEL COM CAROTID INNOMINATE BILAT MOD SED  09/26/2020   IR ANGIO VERTEBRAL SEL VERTEBRAL UNI R MOD SED  09/26/2020   IR IVC FILTER PLMT / S&I /IMG GUID/MOD SED  10/08/2020   IR IVUS EACH ADDITIONAL NON CORONARY VESSEL  11/02/2020   IR PTA VENOUS EXCEPT DIALYSIS CIRCUIT  11/02/2020   IR RADIOLOGIST EVAL & MGMT  01/17/2021   IR  THROMBECT VENO MECH MOD SED  10/27/2020   IR THROMBECT VENO MECH MOD SED  11/02/2020   IR US GUIDE VASC ACCESS LEFT  10/27/2020   IR US GUIDE VASC ACCESS RIGHT  09/26/2020   IR US GUIDE VASC ACCESS RIGHT  11/02/2020   IR VENO/EXT/UNI RIGHT  11/02/2020   IR VENOCAVAGRAM IVC  10/27/2020   RETINAL DETACHMENT SURGERY     right knee arthroscopy     TEE WITHOUT CARDIOVERSION N/A 09/27/2013   Procedure: TRANSESOPHAGEAL ECHOCARDIOGRAM (TEE);  Surgeon: Lelon Perla, MD;  Location: [facility] ENDOSCOPY;  Service: Cardiovascular;  Laterality: N/A;   WRIST SURGERY      There were no vitals filed for this visit.   Subjective Assessment - 03/08/21 1756     Subjective  Patient reported really enjoying the pool session last week.    Patient is accompanied by: Family member    Pertinent History ICH of brainstem (complicated by hydrocephalus, DVTs, and tracheostomy).  PMH:  a-fib    Limitations fall risk, ataxia, impulsivity    Patient Stated Goals use L side of body better (decrease ataxia), stay active and heal brain    Currently in Pain? No/denies    Pain Score 0-No pain  OT Treatments/Exercises (OP) - 03/08/21 0001       Visual/Perceptual Exercises   Other Exercises Patient has prism sticker in right eyeglass lens.  Patient has been wearing all day to get used to it.  Reports hazy vision, but not doublke vision.      Neurological Re-education Exercises   Other Exercises 1 Neuromuscular reeducation to address postural control during transitional movements, namely sit to stand, stand to sit, and rotational movements for scooting transitioning supine to sitting.  Patient does best with slow movements, with base of support controlled before movement starts.  Patient carried over points from last session relating to weight shifting sufficiently forward to balance over feet for standing - emphasis today on finishing stand (hip/knee/trunk) extension with control vs with  momentum.  Worked on modified 4 point to side sit in both directions to address controlled rotational patterns.  Seated on physioball to encourage greater lower trunk initiaited weight shifts - patient moves excessively from head and upper trunk.                      OT Short Term Goals - 03/08/21 1805       OT SHORT TERM GOAL #1   Title Pt/caregiver will be independent with initial HEP for LUE coordination and R hand strength.--    Time 4    Period Weeks    Status Achieved   needs continued reinforcement, pt is not using consistently   Target Date 03/02/21      OT SHORT TERM GOAL #2   Title Pt/caregiver will be independent with vision HEP and visual compensation strategies.    Time 4    Period Weeks    Status Achieved   Pt/ family verbalize understanding of vision HEP, needs continued reinforcement of compensatory strategeis- pt is not performing head turns     OT SHORT TERM GOAL #3   Title Pt will improve LUE coordination for ADLs as shown by improving score on box and blocks test by at least 6.    Baseline L-21 blocks    Time 4    Period Weeks    Status On-going   25 blocks     OT SHORT TERM GOAL #4   Title Pt will perform simple snack prep/home maintenance task from w/c level mod I.    Time 4    Period Weeks    Status Achieved   met per pt's wife     OT SHORT TERM GOAL #5   Title Pt will verbalize understanding of compensation strategies for sensory deficts for incr safety.    Time 4    Period Weeks    Status On-going   Pt verbalized that he has to be careful about temperature, needs additional reinforcement     OT SHORT TERM GOAL #6   Title Pt will improve R hand grip stength to at least 32lbs to assist with ADLs/opening containers.    Time 4    Period Weeks    Status Achieved   33.5     OT SHORT TERM GOAL #7   Title --               OT Long Term Goals - 03/08/21 1806       OT LONG TERM GOAL #1   Title Pt will be independent with updated  HEP--    Time 12    Period Weeks    Status On-going      OT LONG  TERM GOAL #2   Title Pt will improve L hand coordination for ADLs as shown by completing 9-hole peg test in less than 70sec.    Baseline 107sec    Time 12    Period Weeks    Status On-going      OT LONG TERM GOAL #3   Title Pt will be mod I with toileting.    Time 12    Period Weeks    Status On-going      OT LONG TERM GOAL #4   Title Pt will perform simple snack prep/home maintenance tasks in standing with supervision.    Time 12    Period Weeks    Status On-going      OT LONG TERM GOAL #5   Title Pt will demo good safety awareness/compensation for sensory deficits and attention to LUE during functional tasks and transfers without cueing.    Time 12    Period Weeks    Status On-going      OT LONG TERM GOAL #6   Title Pt will perform simple environmental scanning/navigation with at least 90% accuracy for incr safety.    Time 12    Period Weeks    Status On-going      OT LONG TERM GOAL #7   Title Pt will perform all basic ADLS with no more than supervision.    Time 12    Period Weeks    Status New                   Plan - 03/08/21 1802     Clinical Impression Statement Pt showing steady improvement with functional mobility and safety. Patient now has prism sticker in right lens of eyeglasses.    OT Occupational Profile and History Detailed Assessment- Review of Records and additional review of physical, cognitive, psychosocial history related to current functional performance    Occupational performance deficits (Please refer to evaluation for details): ADL's;IADL's;Work;Leisure;Social Participation    Body Structure / Function / Physical Skills ADL;Strength;Balance;Proprioception;UE functional use;IADL;Endurance;Vision;Mobility;Coordination;Decreased knowledge of precautions;FMC;GMC;Sensation    Cognitive Skills Attention;Safety Awareness;Memory;Perception;Problem Solve    Rehab Potential Good     Clinical Decision Making Several treatment options, min-mod task modification necessary    Comorbidities Affecting Occupational Performance: May have comorbidities impacting occupational performance    Modification or Assistance to Complete Evaluation  Min-Moderate modification of tasks or assist with assess necessary to complete eval    OT Frequency 3x / week   updated frequency may decrease after 4 weeks   OT Duration 12 weeks   written for 12 weeks however may d/c after 8 depenent upon progress.   OT Treatment/Interventions Self-care/ADL training;Moist Heat;Fluidtherapy;DME and/or AE instruction;Balance training;Therapeutic activities;Aquatic Therapy;Ultrasound;Therapeutic exercise;Cognitive remediation/compensation;Visual/perceptual remediation/compensation;Functional Mobility Training;Neuromuscular education;Cryotherapy;Energy conservation;Manual Therapy;Patient/family education    Plan Visit 20 - PN, Check box and blocks and sensory (safety) goal- , functional reaching, standing balance, UE control    Consulted and Agree with Plan of Care Family member/caregiver;Patient    Family Member Consulted wife             Patient will benefit from skilled therapeutic intervention in order to improve the following deficits and impairments:   Body Structure / Function / Physical Skills: ADL, Strength, Balance, Proprioception, UE functional use, IADL, Endurance, Vision, Mobility, Coordination, Decreased knowledge of precautions, FMC, GMC, Sensation Cognitive Skills: Attention, Safety Awareness, Memory, Perception, Problem Solve     Visit Diagnosis: Unsteadiness on feet  Ataxia  Visuospatial deficit  Muscle weakness (generalized)  Other disturbances of skin sensation  Other lack of coordination    Problem List Patient Active Problem List   Diagnosis Date Noted   Thrombosis    Sleep disturbance    Dysphagia, post-stroke    PAF (paroxysmal atrial fibrillation) ([place])    Acute  pulmonary embolism without acute cor pulmonale (HCC)    Malnutrition of moderate degree 11/04/2020   Tracheostomy care ([place])    Pressure injury of skin 10/23/2020   Intracranial hemorrhage (HCC)    Atrial fibrillation ([place])    Prediabetes    Leukocytosis    Acute blood loss anemia    Hypernatremia    ICH (intracerebral hemorrhage) ([place]) 09/25/2020   Unilateral primary osteoarthritis, left knee 09/26/2016   Atrial flutter ([place]) 09/25/2013   Near syncope 09/25/2013    Mariah Milling, OT/L 03/08/2021, 6:07 PM  [facility] [address] [place], [place], [address] Phone: [phone]   Fax:  [phone]  Name: Walter [name] MRN: [id number] Date of Birth: October 14, 1955

## 2021-03-09 ENCOUNTER — Ambulatory Visit: Payer: Medicare Other

## 2021-03-09 DIAGNOSIS — R270 Ataxia, unspecified: Secondary | ICD-10-CM

## 2021-03-09 DIAGNOSIS — I639 Cerebral infarction, unspecified: Secondary | ICD-10-CM

## 2021-03-09 DIAGNOSIS — R2681 Unsteadiness on feet: Secondary | ICD-10-CM

## 2021-03-09 DIAGNOSIS — M6281 Muscle weakness (generalized): Secondary | ICD-10-CM

## 2021-03-09 NOTE — Therapy (Addendum)
OUTPATIENT PHYSICAL THERAPY Progress note/Recertification Note Patient Name: Walter [name] MRN: [id number] DOB:[birth year], 65 y.o., male Today's Date: 03/09/2021  PCP: Alroy Dust, L.Marlou Sa, MD REFERRING PROVIDER: Alroy Dust, Carlean Jews.Marlou Sa, MD   PT End of Session - 03/09/21 1022     Visit Number 20    Number of Visits 44    Date for PT Re-Evaluation 05/04/21    Authorization Type Medicare 12/26/20 to 02/20/21; Recert 10/14/36 to 45/36/46; 80H PN/recert on 08/01/20 to 48/2/50    Progress Note Due on Visit 30    PT Start Time 0945    PT Stop Time 1030    PT Time Calculation (min) 45 min    Equipment Utilized During Treatment Gait belt;Other (comment)   Ankle cuff and waist buoys, kick board, noodle/sqoodle, nekdoodle, weights and barbells, water walker   Activity Tolerance Patient tolerated treatment well    Behavior During Therapy WFL for tasks assessed/performed                  Past Medical History:  Diagnosis Date   Atrial flutter ([place])    s/p ablation   DVT (deep venous thrombosis) ([place])    Near syncope 09/25/2013   Paroxysmal atrial fibrillation (HCC)    Stroke [facility])    initially hemorragic (not on [place]) followed by subsequent embolic stroke   Past Surgical History:  Procedure Laterality Date   A-FLUTTER ABLATION N/A 03/09/2020   Procedure: A-FLUTTER ABLATION;  Surgeon: Thompson Grayer, MD;  Location: [place] CV LAB;  Service: Cardiovascular;  Laterality: N/A;   CARDIOVERSION N/A 09/27/2013   Procedure: CARDIOVERSION;  Surgeon: Lelon Perla, MD;  Location: [facility] ENDOSCOPY;  Service: Cardiovascular;  Laterality: N/A;   IR ANGIO EXTERNAL CAROTID SEL EXT CAROTID UNI R MOD SED  09/26/2020   IR ANGIO INTRA EXTRACRAN SEL COM CAROTID INNOMINATE BILAT MOD SED  09/26/2020   IR ANGIO VERTEBRAL SEL VERTEBRAL UNI R MOD SED  09/26/2020   IR IVC FILTER PLMT / S&I /IMG GUID/MOD SED  10/08/2020   IR IVUS EACH ADDITIONAL NON CORONARY VESSEL  11/02/2020   IR PTA VENOUS EXCEPT DIALYSIS CIRCUIT   11/02/2020   IR RADIOLOGIST EVAL & MGMT  01/17/2021   IR THROMBECT VENO MECH MOD SED  10/27/2020   IR THROMBECT VENO MECH MOD SED  11/02/2020   IR US GUIDE VASC ACCESS LEFT  10/27/2020   IR US GUIDE VASC ACCESS RIGHT  09/26/2020   IR US GUIDE VASC ACCESS RIGHT  11/02/2020   IR VENO/EXT/UNI RIGHT  11/02/2020   IR VENOCAVAGRAM IVC  10/27/2020   RETINAL DETACHMENT SURGERY     right knee arthroscopy     TEE WITHOUT CARDIOVERSION N/A 09/27/2013   Procedure: TRANSESOPHAGEAL ECHOCARDIOGRAM (TEE);  Surgeon: Lelon Perla, MD;  Location: [facility] ENDOSCOPY;  Service: Cardiovascular;  Laterality: N/A;   WRIST SURGERY     Patient Active Problem List   Diagnosis Date Noted   Thrombosis    Sleep disturbance    Dysphagia, post-stroke    PAF (paroxysmal atrial fibrillation) ([place])    Acute pulmonary embolism without acute cor pulmonale (HCC)    Malnutrition of moderate degree 11/04/2020   Tracheostomy care ([place])    Pressure injury of skin 10/23/2020   Intracranial hemorrhage (HCC)    Atrial fibrillation (HCC)    Prediabetes    Leukocytosis    Acute blood loss anemia    Hypernatremia    ICH (intracerebral hemorrhage) ([place]) 09/25/2020   Unilateral primary osteoarthritis, left knee 09/26/2016  Atrial flutter (HCC) 09/25/2013   Near syncope 09/25/2013    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   THERAPY DIAG:  Unsteadiness on feet - Plan: PT plan of care cert/re-cert  Ataxia - Plan: PT plan of care cert/re-cert  Muscle weakness (generalized) - Plan: PT plan of care cert/re-cert  Cerebrovascular accident (CVA), unspecified mechanism (HCC) - Plan: PT plan of care cert/re-cert  PERTINENT HISTORY: atrial flutter s/p ablation, DVT, near syncope, CVA  PRECAUTIONS: fall  SUBJECTIVE: "I just want to swim"  Objective:  Functional tests tracking: Berg Balance scale: eval 11/56,  21/56 (03/09/21) 10 meter walk test: 0.26m/s with RW (03/09/21) 5x sit to stand: 17 sec with bil UE support (03/09/21)  TODAY'S TREATMENT: 03/09/21 Gait training: standing up from standard chair with large based quad cane walking up around cone (10 feet away) and sitting back down in chair: 2x, cues for 3step pattern and sequencing, min A to mod A required 1 x 230 feet with RW, CGA to min A with cues for smaller steps BBS performed 5x sit to stand performed.   03/01/2021 Pt seen for aquatic therapy today.  Treatment took place in water 3.25-4.8 ft in depth at the [facility] pool. Temp of water was 92. entered/exited the pool via stairs step to pattern independently with bilat rail.  Introduced pt to aquatic setting.                           Amb using water walker. forward, backward and side stepping/walking cues for increased step length, posture and weight shifting x 12 minutes. Pt requiring mod to min asst. Multiple LOB.  Seated -sit to stand transfers from water bench submerged to waist when standing.  Cueing for weight shift/execution. Initally completed with water walker, progressed to thick yellow noodle.  Pt requiring min-cga for completion. 2 x 5 Marching x 10 LAQ x 10.  Standing -step ups x leading with L/R LE.  Cueing for decreased impulsiveness /improved concentration on finishing first movement before moving to next. -toe raises 2 x 10 Cueing for balance/slowing movements  Supine suspension Flutter kicking x 5 min supported by Pt  Pt requires buoyancy for support and to offload joints with strengthening exercises. Viscosity of the water is needed for resistance of strengthening; water current perturbations provides challenge to standing balance unsupported, requiring increased core activation.   02/28/21: Gait training: 1 x 460 feet with tactile cues at pelvis to guide weight shifts cues for slower cadence and turning slowly Standing on floor: OH reaching and fwd  reaching: alternating between reaches: 10x each Standing touching toes: 5x Gait training: chair to chair transfer with 180 deg turn: quad cane, CGA to min A: 4x  02/26/21: Gait training: 1 x 420 feet with tactile cues at pelvis to guide weight shifts cues for slower cadence and turning slowly Sit to stand: 10x no HHA Standing at countertop with wide BOS: moving basket with 20lbs from side to side to increase core engagement and weight shifts Picking up basket with 20lb from counter to chest: 5x Working on ankle strategy: standing heel and toe raises without HHA: very close guarding: 2 x 10  Standing contralateral reaching: 10x R and L    PATIENT EDUCATION: Education details: discussed reassessment findings Person educated: Patient and caregiver Education method: Explanation, Demonstration, and Verbal cues Education comprehension: verbalized understanding       HOME EXERCISE PROGRAM: Continue daily walking with CG assist  ASSESSMENT:   CLINICAL IMPRESSION:  Patient has been seen for total of 20 sessions from 12/26/20 to 03/09/21. Patient is requiring lesser assistance with functional transfers and gait. Patient has demonstrated improvement in his Berg balance scale significantly. Patient will continue to benefit from skilled PT to further progress his functional mobility, gait and balance to improve independence and reduce caregiver burden.   REHAB POTENTIAL: Good   CLINICAL DECISION MAKING: Stable/uncomplicated  GOALS: Goals reviewed with patient? Yes   SHORT TERM GOALS:   STG Name Target Date Goal status  1 Patient will be able to perform chair to chair transfer with CGA/SBA with use of RW for safety to improve independence. Baseline: min to mod A (12/26/20); CGA/minA (03/09/21) 01/23/2021 02/22/21 Able to transfer STS and stand pivot with SBA/CGA  2 Pt will be able to ambulate 115' with RW with CGA to min A with RW for safety to improve independence Baseline: min to mod A with RW  145'; 230 feet with RW and CGA to MIN A 01/23/2021 03/09/21 Goal met  3 Pt will be able to maintain standing balance for 30 sec to improve static balance Baseline: 10 sec before needing help due to LOB (12/26/20) 01/23/2021 02/05/21 able to stand with S for 30s, goal met  4. Pt will be able to perform sit to stand transfers with SBA to improve independence 04/06/21 Revised 03/09/21  5. Pt will be able to perform 230 feet of walking with RW with SBA/CGA to improve household ambulation  04/06/21 Revised 03/09/21    LONG TERM GOALS:   LTG Name Target Date Goal status  1 Pt will demo >5 points improvement on Berg Balance Scale to improve static balance Baseline: 11/56 01/03/21 02/20/2021 INITIAL  2 Pt will be able to ambulate >500 feet with RW and SBA to improve walking endurance and safety. Baseline: 145' with RW min to mod A; 230 feet with CGA to Min A (03/09/21)  05/18/21 Progressing continue   3 Pt will be able to perform gait speed improvement by 0.8ms to improve community ambulation. Baseline:0.257m (03/09/21) 05/16/21 Progressing continue  4 Pt will demo 5 sec improvement in 5x sit to stand (with or without UE support) to improve functional strength Baseline: 5x sit to stand with bil UE 17 sec;  05/18/21 Progressing continue    PLAN: PT FREQUENCY: 2-3x/week   PT DURATION: 8 weeks   PLANNED INTERVENTIONS: Aquatic therapy, Therapeutic exercises, Therapeutic activity, Neuro Muscular re-education, Balance training, Gait training, Patient/Family education, Joint mobilization, Stair training, Visual/preceptual remediation/compensation, Wheelchair mobility training, Cryotherapy, Moist heat, and Manual therapy   PLAN FOR NEXT SESSION:  Aquatics: balance challenges progressed with core strengthening. Standing/walking tasks in // bars, floor targets and stepping tasks, higher level gait training-stepping over and around obstacles, head and trunk righting, STGs check, sitting balance tasks, BERG, gait speed and  5x STS    Visit Diagnosis: Unsteadiness on feet - Plan: PT plan of care cert/re-cert  Ataxia - Plan: PT plan of care cert/re-cert  Muscle weakness (generalized) - Plan: PT plan of care cert/re-cert  Cerebrovascular accident (CVA), unspecified mechanism ([place]- Plan: PT plan of care cert/re-cert     Problem List Patient Active Problem List   Diagnosis Date Noted   Thrombosis    Sleep disturbance    Dysphagia, post-stroke    PAF [place]mal [place] fibrillation) ([place]   Acute pulmonary embolism without acute cor pulmonale ([place]  Malnutrition of moderate degree 11/04/2020   Tracheostomy care ([place])    Pressure injury of skin 10/23/2020   Intracranial hemorrhage (HCC)    Atrial fibrillation (HCC)    Prediabetes    Leukocytosis    Acute blood loss anemia    Hypernatremia    ICH (intracerebral hemorrhage) ([place]) 09/25/2020   Unilateral primary osteoarthritis, left knee 09/26/2016   Atrial flutter ([place]) 09/25/2013   Near syncope 09/25/2013    Kerrie Pleasure, PT 03/09/2021, 3:41 PM  [facility] [address] [place], [place], [address] Phone: [phone]   Fax:  [phone]

## 2021-03-12 ENCOUNTER — Other Ambulatory Visit: Payer: Self-pay

## 2021-03-12 ENCOUNTER — Encounter: Payer: Self-pay | Admitting: Occupational Therapy

## 2021-03-12 ENCOUNTER — Ambulatory Visit: Payer: Medicare Other | Admitting: Occupational Therapy

## 2021-03-12 ENCOUNTER — Ambulatory Visit: Payer: Medicare Other

## 2021-03-12 DIAGNOSIS — I69218 Other symptoms and signs involving cognitive functions following other nontraumatic intracranial hemorrhage: Secondary | ICD-10-CM

## 2021-03-12 DIAGNOSIS — R278 Other lack of coordination: Secondary | ICD-10-CM

## 2021-03-12 DIAGNOSIS — M6281 Muscle weakness (generalized): Secondary | ICD-10-CM

## 2021-03-12 DIAGNOSIS — R208 Other disturbances of skin sensation: Secondary | ICD-10-CM

## 2021-03-12 DIAGNOSIS — R2681 Unsteadiness on feet: Secondary | ICD-10-CM | POA: Diagnosis not present

## 2021-03-12 DIAGNOSIS — R41844 Frontal lobe and executive function deficit: Secondary | ICD-10-CM

## 2021-03-12 DIAGNOSIS — R41842 Visuospatial deficit: Secondary | ICD-10-CM

## 2021-03-12 DIAGNOSIS — R41841 Cognitive communication deficit: Secondary | ICD-10-CM

## 2021-03-12 DIAGNOSIS — R270 Ataxia, unspecified: Secondary | ICD-10-CM

## 2021-03-12 DIAGNOSIS — I639 Cerebral infarction, unspecified: Secondary | ICD-10-CM

## 2021-03-12 DIAGNOSIS — R471 Dysarthria and anarthria: Secondary | ICD-10-CM

## 2021-03-12 NOTE — Therapy (Signed)
[facility] [address] [place], [place], [address] Phone: [phone]   Fax:  [phone]  Speech Language Pathology Treatment  Patient Details  Name: Walter [name] MRN: [id number] Date of Birth: 05/20/1956 Referring Provider (SLP): Charlett Blake, MD   Encounter Date: 03/12/2021   End of Session - 03/12/21 1055     Visit Number 21    Number of Visits 46    Date for SLP Re-Evaluation 04/20/21    Authorization Type Medicare    SLP Start Time 0933    SLP Stop Time  7510    SLP Time Calculation (min) 42 min    Activity Tolerance Patient tolerated treatment well             Past Medical History:  Diagnosis Date   Atrial flutter ([place])    s/p ablation   DVT (deep venous thrombosis) ([place])    Near syncope 09/25/2013   Paroxysmal atrial fibrillation ([place])    Stroke [facility])    initially hemorragic (not on [facility]) followed by subsequent embolic stroke    Past Surgical History:  Procedure Laterality Date   A-FLUTTER ABLATION N/A 03/09/2020   Procedure: A-FLUTTER ABLATION;  Surgeon: Thompson Grayer, MD;  Location: [facility];  Service: Cardiovascular;  Laterality: N/A;   CARDIOVERSION N/A 09/27/2013   Procedure: CARDIOVERSION;  Surgeon: Lelon Perla, MD;  Location: [facility] ENDOSCOPY;  Service: Cardiovascular;  Laterality: N/A;   IR ANGIO EXTERNAL CAROTID SEL EXT CAROTID UNI R MOD SED  09/26/2020   IR ANGIO INTRA EXTRACRAN SEL COM CAROTID INNOMINATE BILAT MOD SED  09/26/2020   IR ANGIO VERTEBRAL SEL VERTEBRAL UNI R MOD SED  09/26/2020   IR IVC FILTER PLMT / S&I /IMG GUID/MOD SED  10/08/2020   IR IVUS EACH ADDITIONAL NON CORONARY VESSEL  11/02/2020   IR PTA VENOUS EXCEPT DIALYSIS CIRCUIT  11/02/2020   IR RADIOLOGIST EVAL & MGMT  01/17/2021   IR THROMBECT VENO MECH MOD SED  10/27/2020   IR THROMBECT VENO MECH MOD SED  11/02/2020   IR US GUIDE VASC ACCESS LEFT  10/27/2020   IR US GUIDE VASC ACCESS RIGHT  09/26/2020   IR US  GUIDE VASC ACCESS RIGHT  11/02/2020   IR VENO/EXT/UNI RIGHT  11/02/2020   IR VENOCAVAGRAM IVC  10/27/2020   RETINAL DETACHMENT SURGERY     right knee arthroscopy     TEE WITHOUT CARDIOVERSION N/A 09/27/2013   Procedure: TRANSESOPHAGEAL ECHOCARDIOGRAM (TEE);  Surgeon: Lelon Perla, MD;  Location: [facility] ENDOSCOPY;  Service: Cardiovascular;  Laterality: N/A;   WRIST SURGERY      There were no vitals filed for this visit.   Subjective Assessment - 03/12/21 0956     Subjective "when (tucking my chin) helps as much as it does it's hard to forget. I don't cough anymore."    Patient is accompained by: --   friend   Currently in Pain? No/denies                   ADULT SLP TREATMENT - 03/12/21 0938       General Information   Behavior/Cognition Alert;Cooperative;Pleasant mood;Requires cueing      Treatment Provided   Treatment provided Cognitive-Linquistic      Cognitive-Linquistic Treatment   Treatment focused on Dysarthria;Cognition    Skilled Treatment Pt used chin down posture with sips, spontaneously x8; no coughing today. SLP targeted abdominal breathing (AB) at rest - pt req'd to look at  hand for success initially; after 1-2 minutes pt did not require to look at hand 100% of the time. Walter [name] recited his 48 everyday sentences using AB initially with little success until SLP cued pt to look at sentence, then down at his hand to verity he was using AB and say the sentence. Pt used AB 90% of the time with this technique. In 3 minutes conversation pt utilized AB approx 25% of the time - he stated he was trying to think about using it during the conversation. SLP then targeted overartculation as pt omitted some short syllables in the 3-minute conversation. In sentnece level responses, pt enunciated to incr intelligibilty 80% of the time. Pt given homework for overarticulation, told to practice 15-20 minutes BID.      Assessment / Recommendations / Plan   Plan Continue with current plan  of care      Progression Toward Goals   Progression toward goals Progressing toward goals                SLP Short Term Goals - 02/21/21 1552       SLP SHORT TERM GOAL #1   Title Pt will complete dysarthria HEP with occasional min A over 2 sessions    Status Not Met      SLP SHORT TERM GOAL #2   Title Pt will produce loud /a/ or "hey!" with at least high 80s dB average over 2 sessions    Status Not Met      SLP SHORT TERM GOAL #3   Title Pt will generate abdominal breathing in 16/20 answers to SLP questions over 2 sessions    Baseline 01-18-21    Status Partially Met      SLP SHORT TERM GOAL #4   Title Pt will demonstrate dysarthria compensations in 10 minute simple to mod complex conversation to be 75% intelligibile with occasional min A over 2 sessions    Status Not Met      SLP SHORT TERM GOAL #5   Title Pt will complete formal cognitive linguistic assessment in first 1-2 ST sessions    Status Achieved      SLP SHORT TERM GOAL #6   Title Pt will use memory and attention compensations for appointments, medicine management, and other daily activities with occasional min A over 2 sessions    Status Not Met              SLP Long Term Goals - 03/12/21 1057       SLP LONG TERM GOAL #1   Title Pt will complete dysarthria HEP with rare min A over 2 sessions    Time 8    Period Weeks    Status On-going      SLP LONG TERM GOAL #2   Title Pt will demonstrate dysarthria compensations in 10 minute simple conversation to be 90% intelligibile with rare min A over 2 sessions    Baseline 02-19-21    Time 8    Period Weeks    Status On-going      SLP LONG TERM GOAL #3   Title Pt will demo improved sustained and selective attention during therapeutic tasks with less than 3 cues for active listening during 2 sessions    Time 1    Period Weeks    Status Deferred      SLP LONG TERM GOAL #4   Title Pt will demonstrate emergent and anticipatory awareness of impulsivity  with less than 5 cues to  modify behaviors during 2 sessions    Status Not Met      SLP LONG TERM GOAL #5   Title Pt will report reduced frustration and improved communication effectiveness via PROM by 2 points at last ST sessions    Baseline CES: 15.5 & V-RQOL:35    Time 8    Period Weeks    Status On-going      SLP LONG TERM GOAL #6   Title Pt will selectively attend to cognitive linguistic task for 5 minutes with 3 or less cues for redirection over 3 sessions    Time 8    Period Weeks    Status On-going              Plan - 03/12/21 1056     Clinical Impression Statement "Walter [name]" presents for OPST intervention secondary to stroke in March 2022. Pt worked with SLP continuing in training pt in breath support and compensations for audible, intelligible speech. Pt demonstrated NO COUGHING today with liquids USING A spontaneous chin tuck posture. Walter [name] presents wtih improving accuracy on structured speech tasks with some limited improvement in carryover at conversational level. Skilled ST is cont'd warranted to address dysarthria and cognition to maximize communication effectiveness and optimize functional independence and safety at home. He continues to demonstrate reduced selective attention, processing and problem solving. Continue skilled ST for cognition as well as carryover of audible intelligible speech across settings.    Speech Therapy Frequency 2x / week    Duration --   16 weeks   Treatment/Interventions Cueing hierarchy;Functional tasks;Patient/family education;Cognitive reorganization;Multimodal communcation approach;Language facilitation;Compensatory techniques;Internal/external aids;SLP instruction and feedback    Potential to Achieve Goals Fair    Potential Considerations Ability to learn/carryover information;Cooperation/participation level    SLP Home Exercise Plan provided    Consulted and Agree with Plan of Care Patient             Patient will benefit from  skilled therapeutic intervention in order to improve the following deficits and impairments:   Dysarthria and anarthria  Cognitive communication deficit    Problem List Patient Active Problem List   Diagnosis Date Noted   Thrombosis    Sleep disturbance    Dysphagia, post-stroke    PAF (paroxysmal atrial fibrillation) (HCC)    Acute pulmonary embolism without acute cor pulmonale (HCC)    Malnutrition of moderate degree 11/04/2020   Tracheostomy care ([place])    Pressure injury of skin 10/23/2020   Intracranial hemorrhage (HCC)    Atrial fibrillation ([place])    Prediabetes    Leukocytosis    Acute blood loss anemia    Hypernatremia    ICH (intracerebral hemorrhage) ([place]) 09/25/2020   Unilateral primary osteoarthritis, left knee 09/26/2016   Atrial flutter ([place]) 09/25/2013   Near syncope 09/25/2013    [facility] ,[place], [place]  03/12/2021, 10:58 AM  [facility] [address] [place], [place], [address] Phone: [phone]   Fax:  [phone]   Name: Walter [name] MRN: [id number] Date of Birth: May 07, 1956

## 2021-03-12 NOTE — Patient Instructions (Signed)
  Practice enunciation with the handouts, 15-20 minutes, twice a day.

## 2021-03-12 NOTE — Therapy (Signed)
[facility] [address] [place], [place], [address] Phone: [phone]   Fax:  [phone]  Occupational Therapy Treatment  Patient Details  Name: NISHAAN STANKE MRN: [id number] Date of Birth: 17-May-1956 Referring Provider (OT): Dr. Alysia Penna   Encounter Date: 03/12/2021   OT End of Session - 03/12/21 0858     Visit Number 20    Number of Visits 54    Date for OT Re-Evaluation 04/27/21    Authorization Type Medicare Part A & B    Authorization - Visit Number 59    Authorization - Number of Visits 20    Progress Note Due on Visit 20    OT Start Time 0853    OT Stop Time 0931    OT Time Calculation (min) 38 min    Activity Tolerance Patient tolerated treatment well    Behavior During Therapy [facility] for tasks assessed/performed             Past Medical History:  Diagnosis Date   Atrial flutter ([place])    s/p ablation   DVT (deep venous thrombosis) ([place])    Near syncope 09/25/2013   Paroxysmal atrial fibrillation ([place])    Stroke [facility])    initially hemorragic (not on [place]) followed by subsequent embolic stroke    Past Surgical History:  Procedure Laterality Date   A-FLUTTER ABLATION N/A 03/09/2020   Procedure: A-FLUTTER ABLATION;  Surgeon: Thompson Grayer, MD;  Location: [facility];  Service: Cardiovascular;  Laterality: N/A;   CARDIOVERSION N/A 09/27/2013   Procedure: CARDIOVERSION;  Surgeon: Lelon Perla, MD;  Location: [facility] ENDOSCOPY;  Service: Cardiovascular;  Laterality: N/A;   IR ANGIO EXTERNAL CAROTID SEL EXT CAROTID UNI R MOD SED  09/26/2020   IR ANGIO INTRA EXTRACRAN SEL COM CAROTID INNOMINATE BILAT MOD SED  09/26/2020   IR ANGIO VERTEBRAL SEL VERTEBRAL UNI R MOD SED  09/26/2020   IR IVC FILTER PLMT / S&I /IMG GUID/MOD SED  10/08/2020   IR IVUS EACH ADDITIONAL NON CORONARY VESSEL  11/02/2020   IR PTA VENOUS EXCEPT DIALYSIS CIRCUIT  11/02/2020   IR RADIOLOGIST EVAL & MGMT  01/17/2021   IR  THROMBECT VENO MECH MOD SED  10/27/2020   IR THROMBECT VENO MECH MOD SED  11/02/2020   IR US GUIDE VASC ACCESS LEFT  10/27/2020   IR US GUIDE VASC ACCESS RIGHT  09/26/2020   IR US GUIDE VASC ACCESS RIGHT  11/02/2020   IR VENO/EXT/UNI RIGHT  11/02/2020   IR VENOCAVAGRAM IVC  10/27/2020   RETINAL DETACHMENT SURGERY     right knee arthroscopy     TEE WITHOUT CARDIOVERSION N/A 09/27/2013   Procedure: TRANSESOPHAGEAL ECHOCARDIOGRAM (TEE);  Surgeon: Lelon Perla, MD;  Location: [facility] ENDOSCOPY;  Service: Cardiovascular;  Laterality: N/A;   WRIST SURGERY      There were no vitals filed for this visit.   Subjective Assessment - 03/12/21 0857     Subjective  Pt reports that he has neuro-ophthalmologist follow up in 3 months, has had prisms for about a week.  Pt has enjoyed pool therapy and that it was helpful.  Pt reports that he's doing better.    Patient is accompanied by: Family member    Pertinent History ICH of brainstem (complicated by hydrocephalus, DVTs, and tracheostomy).  PMH:  a-fib    Limitations fall risk, ataxia, impulsivity    Patient Stated Goals use L side of body better (decrease ataxia), stay active and heal  brain    Currently in Pain? No/denies               Checked goals and discussed progress (reviewed safety precautions, assessed box and blocks and 9-hole peg test, assessed progress with BADLs)--see goal status updates below.  Environmental scanning/navigation with ambulating with RW in hallway for simple scanning to locate items with 70% accuracy and min cueing and CGA for balance/mobility and control.  Standing at counter with focus on midline alignment/posture/postural control in static and then with reaching with each UE incorporating lateral wt. Shifts, trunk rotation, and forward wt. Shifts to incr associated trunk movements and core stability with movement.             OT Short Term Goals - 03/12/21 0859       OT SHORT TERM GOAL #1   Title Pt/caregiver  will be independent with initial HEP for LUE coordination and R hand strength.--    Time 4    Period Weeks    Status Achieved   needs continued reinforcement, pt is not using consistently   Target Date 03/02/21      OT SHORT TERM GOAL #2   Title Pt/caregiver will be independent with vision HEP and visual compensation strategies.    Time 4    Period Weeks    Status Achieved   Pt/ family verbalize understanding of vision HEP, needs continued reinforcement of compensatory strategeis- pt is not performing head turns     OT SHORT TERM GOAL #3   Title Pt will improve LUE coordination for ADLs as shown by improving score on box and blocks test by at least 6.    Baseline L-21 blocks    Time 4    Period Weeks    Status On-going   25 blocks.  03/12/21:  24 blocks     OT SHORT TERM GOAL #4   Title Pt will perform simple snack prep/home maintenance task from w/c level mod I.    Time 4    Period Weeks    Status Achieved   met per pt's wife     OT SHORT TERM GOAL #5   Title Pt will verbalize understanding of compensation strategies for sensory deficts for incr safety.    Time 4    Period Weeks    Status Achieved   Pt verbalized that he has to be careful about temperature, needs additional reinforcement.  03/12/21:  met     OT SHORT TERM GOAL #6   Title Pt will improve R hand grip stength to at least 32lbs to assist with ADLs/opening containers.    Time 4    Period Weeks    Status Achieved   33.5     OT SHORT TERM GOAL #7   Title --               OT Long Term Goals - 03/12/21 0904       OT LONG TERM GOAL #1   Title Pt will be independent with updated HEP--    Time 12    Period Weeks    Status On-going      OT LONG TERM GOAL #2   Title Pt will improve L hand coordination for ADLs as shown by completing 9-hole peg test in less than 70sec.    Baseline 107sec    Time 12    Period Weeks    Status On-going   03/12/21:  78sec, 78sec (with multiple drops)     OT  LONG TERM GOAL #3    Title Pt will be mod I with toileting.    Time 12    Period Weeks    Status On-going   03/12/21:  supervision/CGA     OT LONG TERM GOAL #4   Title Pt will perform simple snack prep/home maintenance tasks in standing with supervision.    Time 12    Period Weeks    Status On-going      OT LONG TERM GOAL #5   Title Pt will demo good safety awareness/compensation for sensory deficits and attention to LUE during functional tasks and transfers without cueing.    Time 12    Period Weeks    Status On-going      OT LONG TERM GOAL #6   Title Pt will perform simple environmental scanning/navigation with at least 90% accuracy for incr safety.    Time 12    Period Weeks    Status On-going   03/12/21:  70% with CGA for balance/ambulation     OT LONG TERM GOAL #7   Title Pt will perform all basic ADLS with no more than supervision.    Time 12    Period Weeks    Status On-going   03/12/21:  min A-supervision                  Plan - 03/12/21 0859     Clinical Impression Statement Progress Note Reporting Period 02/12/21-03/12/21:  Pt continues to show steady improvement with functional mobility and safety. Patient now has prism sticker in right lens of eyeglasses, but is still adjusting to them.  Pt also demo improvements in coordination and BADLs.  Pt would benefit from further occupational therapy to continue to address functional mobility, ADLs, safety, UE control.    OT Occupational Profile and History Detailed Assessment- Review of Records and additional review of physical, cognitive, psychosocial history related to current functional performance    Occupational performance deficits (Please refer to evaluation for details): ADL's;IADL's;Work;Leisure;Social Participation    Body Structure / Function / Physical Skills ADL;Strength;Balance;Proprioception;UE functional use;IADL;Endurance;Vision;Mobility;Coordination;Decreased knowledge of precautions;FMC;GMC;Sensation    Cognitive Skills  Attention;Safety Awareness;Memory;Perception;Problem Solve    Rehab Potential Good    Clinical Decision Making Several treatment options, min-mod task modification necessary    Comorbidities Affecting Occupational Performance: May have comorbidities impacting occupational performance    Modification or Assistance to Complete Evaluation  Min-Moderate modification of tasks or assist with assess necessary to complete eval    OT Frequency 3x / week   updated frequency may decrease after 4 weeks   OT Duration 12 weeks   written for 12 weeks however may d/c after 8 depenent upon progress.   OT Treatment/Interventions Self-care/ADL training;Moist Heat;Fluidtherapy;DME and/or AE instruction;Balance training;Therapeutic activities;Aquatic Therapy;Ultrasound;Therapeutic exercise;Cognitive remediation/compensation;Visual/perceptual remediation/compensation;Functional Mobility Training;Neuromuscular education;Cryotherapy;Energy conservation;Manual Therapy;Patient/family education    Plan continue with functional reaching, UE control, standing balance/functional mobility    Consulted and Agree with Plan of Care Family member/caregiver;Patient    Family Member Consulted wife             Patient will benefit from skilled therapeutic intervention in order to improve the following deficits and impairments:   Body Structure / Function / Physical Skills: ADL, Strength, Balance, Proprioception, UE functional use, IADL, Endurance, Vision, Mobility, Coordination, Decreased knowledge of precautions, FMC, GMC, Sensation Cognitive Skills: Attention, Safety Awareness, Memory, Perception, Problem Solve     Visit Diagnosis: Ataxia  Muscle weakness (generalized)  Visuospatial deficit  Other disturbances of skin sensation  Other lack of coordination  Unsteadiness on feet  Frontal lobe and executive function deficit  Other symptoms and signs involving cognitive functions following other nontraumatic  intracranial hemorrhage    Problem List Patient Active Problem List   Diagnosis Date Noted   Thrombosis    Sleep disturbance    Dysphagia, post-stroke    PAF (paroxysmal atrial fibrillation) ([place])    Acute pulmonary embolism without acute cor pulmonale (HCC)    Malnutrition of moderate degree 11/04/2020   Tracheostomy care ([place])    Pressure injury of skin 10/23/2020   Intracranial hemorrhage (HCC)    Atrial fibrillation ([place])    Prediabetes    Leukocytosis    Acute blood loss anemia    Hypernatremia    ICH (intracerebral hemorrhage) ([place]) 09/25/2020   Unilateral primary osteoarthritis, left knee 09/26/2016   Atrial flutter ([place]) 09/25/2013   Near syncope 09/25/2013    [facility], OT/L 03/12/2021, 2:49 PM  [facility] [address] [place], [place], [address] Phone: [phone]   Fax:  [phone]  Name: TALLEN SCHNORR MRN: [id number] Date of Birth: 08/29/1955  Vianne Bulls, OTR/L [facility] [address] [place], [place]  [address] [phone] phone [phone] 03/12/21 2:49 PM

## 2021-03-12 NOTE — Therapy (Signed)
[facility] [address] [place], [place], [address] Phone: [phone]   Fax:  [phone]  Physical Therapy Treatment  Patient Details  Name: Walter [name] MRN: [id number] Date of Birth: Aug 20, 1955 No data recorded  Encounter Date: 03/12/2021   PT End of Session - 03/12/21 1013     Visit Number 21    Number of Visits 44    Date for PT Re-Evaluation 05/04/21    Authorization Type Medicare 12/26/20 to 02/20/21; Recert 02/21/21 to 04/26/21; 10v PN/recert on 03/09/21 to 05/04/21    Progress Note Due on Visit 30    PT Start Time 1015    PT Stop Time 1100    PT Time Calculation (min) 45 min    Equipment Utilized During Treatment Gait belt;Other (comment)    Activity Tolerance Patient tolerated treatment well    Behavior During Therapy [facility] for tasks assessed/performed             Past Medical History:  Diagnosis Date   Atrial flutter ([facility])    s/p ablation   DVT (deep venous thrombosis) (HCC)    Near syncope 09/25/2013   Paroxysmal atrial fibrillation (HCC)    Stroke [facility])    initially hemorragic (not on [facility]) followed by subsequent embolic stroke    Past Surgical History:  Procedure Laterality Date   A-FLUTTER ABLATION N/A 03/09/2020   Procedure: A-FLUTTER ABLATION;  Surgeon: Hillis Range, MD;  [facility];  Service: Cardiovascular;  Laterality: N/A;   CARDIOVERSION N/A 09/27/2013   Procedure: CARDIOVERSION;  Surgeon: Lewayne Bunting, MD;  Location: [facility] ENDOSCOPY;  Service: Cardiovascular;  Laterality: N/A;   IR ANGIO EXTERNAL CAROTID SEL EXT CAROTID UNI R MOD SED  09/26/2020   IR ANGIO INTRA EXTRACRAN SEL COM CAROTID INNOMINATE BILAT MOD SED  09/26/2020   IR ANGIO VERTEBRAL SEL VERTEBRAL UNI R MOD SED  09/26/2020   IR IVC FILTER PLMT / S&I /IMG GUID/MOD SED  10/08/2020   IR IVUS EACH ADDITIONAL NON CORONARY VESSEL  11/02/2020   IR PTA VENOUS EXCEPT DIALYSIS CIRCUIT  11/02/2020   IR RADIOLOGIST EVAL &  MGMT  01/17/2021   IR THROMBECT VENO MECH MOD SED  10/27/2020   IR THROMBECT VENO MECH MOD SED  11/02/2020   IR US GUIDE VASC ACCESS LEFT  10/27/2020   IR US GUIDE VASC ACCESS RIGHT  09/26/2020   IR US GUIDE VASC ACCESS RIGHT  11/02/2020   IR VENO/EXT/UNI RIGHT  11/02/2020   IR VENOCAVAGRAM IVC  10/27/2020   RETINAL DETACHMENT SURGERY     right knee arthroscopy     TEE WITHOUT CARDIOVERSION N/A 09/27/2013   Procedure: TRANSESOPHAGEAL ECHOCARDIOGRAM (TEE);  Surgeon: Lewayne Bunting, MD;  Location: [facility] ENDOSCOPY;  Service: Cardiovascular;  Laterality: N/A;   WRIST SURGERY      There were no vitals filed for this visit.                                            Patient will benefit from skilled therapeutic intervention in order to improve the following deficits and impairments:     Visit Diagnosis: Ataxia  Muscle weakness (generalized)  Unsteadiness on feet  Cerebrovascular accident (CVA), unspecified mechanism [facility])     Problem List Patient Active Problem List   Diagnosis Date Noted   Thrombosis    Sleep disturbance  Dysphagia, post-stroke    PAF (paroxysmal atrial fibrillation) (HCC)    Acute pulmonary embolism without acute cor pulmonale (HCC)    Malnutrition of moderate degree 11/04/2020   Tracheostomy care (HCC)    Pressure injury of skin 10/23/2020   Intracranial hemorrhage (HCC)    Atrial fibrillation (HCC)    Prediabetes    Leukocytosis    Acute blood loss anemia    Hypernatremia    ICH (intracerebral hemorrhage) (HCC) 09/25/2020   Unilateral primary osteoarthritis, left knee 09/26/2016   Atrial flutter (HCC) 09/25/2013   Near syncope 09/25/2013    Hildred Laser, PT 03/12/2021, 10:14 AM  [facility] [address] [place], [place], [address] Phone: [phone]   Fax:  [phone]  Name: Walter [name] MRN: [id number] Date of Birth: 22-Dec-1955

## 2021-03-12 NOTE — Therapy (Signed)
OUTPATIENT PHYSICAL THERAPY Progress note/Recertification Note Patient Name: Walter [name] MRN: [id number] DOB:1956/01/20, 65 y.o., male Today's Date: 03/12/2021  PCP: Alroy Dust, L.Marlou Sa, MD REFERRING PROVIDER: Alroy Dust, Carlean Jews.Marlou Sa, MD   PT End of Session - 03/12/21 1013     Visit Number 21    Number of Visits 44    Date for PT Re-Evaluation 05/04/21    Authorization Type Medicare 12/26/20 to 02/20/21; Recert 07/02/56 to 52/77/82; 42P PN/recert on 11/01/59 to 44/3/15    Progress Note Due on Visit 30    PT Start Time 1015    PT Stop Time 1100    PT Time Calculation (min) 45 min    Equipment Utilized During Treatment Gait belt;Other (comment)    Activity Tolerance Patient tolerated treatment well    Behavior During Therapy [facility] for tasks assessed/performed                  Past Medical History:  Diagnosis Date   Atrial flutter ([place])    s/p ablation   DVT (deep venous thrombosis) ([place])    Near syncope 09/25/2013   Paroxysmal atrial fibrillation (HCC)    Stroke [facility])    initially hemorragic (not on [place]) followed by subsequent embolic stroke   Past Surgical History:  Procedure Laterality Date   A-FLUTTER ABLATION N/A 03/09/2020   Procedure: A-FLUTTER ABLATION;  Surgeon: Thompson Grayer, MD;  Location: [place] CV LAB;  Service: Cardiovascular;  Laterality: N/A;   CARDIOVERSION N/A 09/27/2013   Procedure: CARDIOVERSION;  Surgeon: Lelon Perla, MD;  Location: [facility] ENDOSCOPY;  Service: Cardiovascular;  Laterality: N/A;   IR ANGIO EXTERNAL CAROTID SEL EXT CAROTID UNI R MOD SED  09/26/2020   IR ANGIO INTRA EXTRACRAN SEL COM CAROTID INNOMINATE BILAT MOD SED  09/26/2020   IR ANGIO VERTEBRAL SEL VERTEBRAL UNI R MOD SED  09/26/2020   IR IVC FILTER PLMT / S&I /IMG GUID/MOD SED  10/08/2020   IR IVUS EACH ADDITIONAL NON CORONARY VESSEL  11/02/2020   IR PTA VENOUS EXCEPT DIALYSIS CIRCUIT  11/02/2020   IR RADIOLOGIST EVAL & MGMT  01/17/2021   IR THROMBECT VENO MECH MOD SED  10/27/2020   IR  THROMBECT VENO MECH MOD SED  11/02/2020   IR US GUIDE VASC ACCESS LEFT  10/27/2020   IR US GUIDE VASC ACCESS RIGHT  09/26/2020   IR US GUIDE VASC ACCESS RIGHT  11/02/2020   IR VENO/EXT/UNI RIGHT  11/02/2020   IR VENOCAVAGRAM IVC  10/27/2020   RETINAL DETACHMENT SURGERY     right knee arthroscopy     TEE WITHOUT CARDIOVERSION N/A 09/27/2013   Procedure: TRANSESOPHAGEAL ECHOCARDIOGRAM (TEE);  Surgeon: Lelon Perla, MD;  Location: [facility] ENDOSCOPY;  Service: Cardiovascular;  Laterality: N/A;   WRIST SURGERY     Patient Active Problem List   Diagnosis Date Noted   Thrombosis    Sleep disturbance    Dysphagia, post-stroke    PAF (paroxysmal atrial fibrillation) ([place])    Acute pulmonary embolism without acute cor pulmonale (HCC)    Malnutrition of moderate degree 11/04/2020   Tracheostomy care ([place])    Pressure injury of skin 10/23/2020   Intracranial hemorrhage (HCC)    Atrial fibrillation (HCC)    Prediabetes    Leukocytosis    Acute blood loss anemia    Hypernatremia    ICH (intracerebral hemorrhage) ([place]) 09/25/2020   Unilateral primary osteoarthritis, left knee 09/26/2016   Atrial flutter ([place]) 09/25/2013   Near syncope 09/25/2013    REFERRING DIAG:  I61.3 (ICD-10-CM) - 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 (HCC) I69.193 (ICD-10-CM) - 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  Has been trying to walk with RW everyday some days up to 33mn with WC follow.  Objective:  Functional tests tracking: Berg Balance scale: eval 11/56, 21/56 (03/09/21) 10 meter walk test: 0.218m with RW (03/09/21) 5x sit to stand: 17 sec with bil UE support (03/09/21)  TODAY'S TREATMENT: 03/12/21  Ambulation of 46021fith RW and CGA, cued to pause walking before pivot turning to focus on task at hand and break down tasks into individual  components to control ataxia Seated on green disk, alternating marching and LAQs with lat press to activate core, 3# ankle weights, alternating pattern, 15 reps per LE Seated on disk, forward reaching using RW 10x              Seated core using disk and 3.3# ball using hip/shoulder tosses, chops and Vs for 10 reps per task  STS with OH reach once standing, 10 reps 03/09/21 Gait training: standing up from standard chair with large based quad cane walking up around cone (10 feet away) and sitting back down in chair: 2x, cues for 3step pattern and sequencing, min A to mod A required 1 x 230 feet with RW, CGA to min A with cues for smaller steps BBS performed 5x sit to stand performed.   03/01/2021 Pt seen for aquatic therapy today.  Treatment took place in water 3.25-4.8 ft in depth at the [facility]. Temp of water was 92. entered/exited the pool via stairs step to pattern independently with bilat rail.  Introduced pt to aquatic setting.                           Amb using water walker. forward, backward and side stepping/walking cues for increased step length, posture and weight shifting x 12 minutes. Pt requiring mod to min asst. Multiple LOB.  Seated -sit to stand transfers from water bench submerged to waist when standing.  Cueing for weight shift/execution. Initally completed with water walker, progressed to thick yellow noodle.  Pt requiring min-cga for completion. 2 x 5 Marching x 10 LAQ x 10.  Standing -step ups x leading with L/R LE.  Cueing for decreased impulsiveness /improved concentration on finishing first movement before moving to next. -toe raises 2 x 10 Cueing for balance/slowing movements  Supine suspension Flutter kicking x 5 min supported by Pt  Pt requires buoyancy for support and to offload joints with strengthening  exercises. Viscosity of the water is needed for resistance of strengthening; water current perturbations provides challenge to standing balance unsupported, requiring increased core activation.   02/28/21: Gait training: 1 x 460 feet with tactile cues at pelvis to guide weight shifts cues for slower cadence and turning slowly Standing on floor: OH reaching and fwd reaching: alternating between reaches: 10x each Standing touching toes: 5x Gait training: chair to chair transfer with 180 deg turn: quad cane, CGA to min A: 4x  02/26/21: Gait training: 1 x 420 feet with tactile cues at pelvis to guide weight shifts cues for slower cadence and turning slowly Sit to stand: 10x no HHA Standing at countertop with  wide BOS: moving basket with 20lbs from side to side to increase core engagement and weight shifts Picking up basket with 20lb from counter to chest: 5x Working on ankle strategy: standing heel and toe raises without HHA: very close guarding: 2 x 10  Standing contralateral reaching: 10x R and L    PATIENT EDUCATION: Education details: discussed reassessment findings Person educated: Patient and caregiver Education method: Explanation, Demonstration, and Verbal cues Education comprehension: verbalized understanding     HOME EXERCISE PROGRAM: Continue daily walking with CG assist  ASSESSMENT:   CLINICAL IMPRESSION:  Observed to demo less ataxic movement with LLE during ambulation, remains impulsive with activity but less so and corrected with cuing, ambulation distance increased to 453f with RW and CGA.  Continued need of guarding with transfers due to balance deficits.  Noted to fatigue folowing long distance ambulation with a drop off in ataxic control noted.  Needs to break down direction changes to individual tasks to reduce fall risk.   REHAB POTENTIAL: Good   CLINICAL DECISION MAKING: Stable/uncomplicated  GOALS: Goals reviewed with patient? Yes   SHORT TERM GOALS:   STG  Name Target Date Goal status  1 Patient will be able to perform chair to chair transfer with CGA/SBA with use of RW for safety to improve independence. Baseline: min to mod A (12/26/20); CGA/minA (03/09/21) 01/23/2021 02/22/21 Able to transfer STS and stand pivot with SBA/CGA  2 Pt will be able to ambulate 115' with RW with CGA to min A with RW for safety to improve independence Baseline: min to mod A with RW 145'; 230 feet with RW and CGA to MIN A 01/23/2021 03/09/21 Goal met  3 Pt will be able to maintain standing balance for 30 sec to improve static balance Baseline: 10 sec before needing help due to LOB (12/26/20) 01/23/2021 02/05/21 able to stand with S for 30s, goal met  4. Pt will be able to perform sit to stand transfers with SBA to improve independence 04/06/21 Revised 03/09/21  5. Pt will be able to perform 230 feet of walking with RW with SBA/CGA to improve household ambulation  04/06/21 Revised 03/09/21    LONG TERM GOALS:   LTG Name Target Date Goal status  1 Pt will demo >5 points improvement on Berg Balance Scale to improve static balance Baseline: 11/56 01/03/21 02/20/2021 INITIAL 03/09/21 BBS 21/56  2 Pt will be able to ambulate >500 feet with RW and SBA to improve walking endurance and safety. Baseline: 145' with RW min to mod A; 230 feet with CGA to Min A (03/09/21)  05/18/21 Progressing continue   3 Pt will be able to perform gait speed improvement by 0.131m to improve community ambulation. Baseline:0.2658m(03/09/21) 05/16/21 Progressing continue  4 Pt will demo 5 sec improvement in 5x sit to stand (with or without UE support) to improve functional strength Baseline: 5x sit to stand with bil UE 17 sec;  05/18/21 Progressing continue    PLAN: PT FREQUENCY: 2-3x/week   PT DURATION: 8 weeks   PLANNED INTERVENTIONS: Aquatic therapy, Therapeutic exercises, Therapeutic activity, Neuro Muscular re-education, Balance training, Gait training, Patient/Family education, Joint mobilization, Stair  training, Visual/preceptual remediation/compensation, Wheelchair mobility training, Cryotherapy, Moist heat, and Manual therapy   PLAN FOR NEXT SESSION:  Balance challenges progressed with core strengthening and tall kneel tasks Standing/walking tasks in // bars, floor targets and stepping tasks, higher level gait training-stepping over and around obstacles, head and trunk righting in sit and stand  Visit Diagnosis: Ataxia  Muscle weakness (generalized)  Unsteadiness on feet  Cerebrovascular accident (CVA), unspecified mechanism ([place])     Problem List Patient Active Problem List   Diagnosis Date Noted   Thrombosis    Sleep disturbance    Dysphagia, post-stroke    PAF (paroxysmal atrial fibrillation) ([place])    Acute pulmonary embolism without acute cor pulmonale (HCC)    Malnutrition of moderate degree 11/04/2020   Tracheostomy care ([place])    Pressure injury of skin 10/23/2020   Intracranial hemorrhage (HCC)    Atrial fibrillation ([place])    Prediabetes    Leukocytosis    Acute blood loss anemia    Hypernatremia    ICH (intracerebral hemorrhage) ([place]) 09/25/2020   Unilateral primary osteoarthritis, left knee 09/26/2016   Atrial flutter ([place]) 09/25/2013   Near syncope 09/25/2013    Lanice Shirts, PT 03/12/2021, 10:15 AM  [facility] [address] [place], [place], [address] Phone: [phone]   Fax:  [phone]

## 2021-03-14 ENCOUNTER — Ambulatory Visit: Payer: Medicare Other

## 2021-03-14 ENCOUNTER — Ambulatory Visit: Payer: Medicare Other | Admitting: Occupational Therapy

## 2021-03-14 ENCOUNTER — Other Ambulatory Visit: Payer: Self-pay

## 2021-03-14 DIAGNOSIS — I69218 Other symptoms and signs involving cognitive functions following other nontraumatic intracranial hemorrhage: Secondary | ICD-10-CM

## 2021-03-14 DIAGNOSIS — R270 Ataxia, unspecified: Secondary | ICD-10-CM

## 2021-03-14 DIAGNOSIS — M6281 Muscle weakness (generalized): Secondary | ICD-10-CM

## 2021-03-14 DIAGNOSIS — R2681 Unsteadiness on feet: Secondary | ICD-10-CM | POA: Diagnosis not present

## 2021-03-14 DIAGNOSIS — R471 Dysarthria and anarthria: Secondary | ICD-10-CM

## 2021-03-14 DIAGNOSIS — R41844 Frontal lobe and executive function deficit: Secondary | ICD-10-CM

## 2021-03-14 DIAGNOSIS — R208 Other disturbances of skin sensation: Secondary | ICD-10-CM

## 2021-03-14 DIAGNOSIS — R278 Other lack of coordination: Secondary | ICD-10-CM

## 2021-03-14 DIAGNOSIS — R41841 Cognitive communication deficit: Secondary | ICD-10-CM

## 2021-03-14 NOTE — Therapy (Signed)
[facility] [address] [place], [place], [address] Phone: [phone]   Fax:  [phone]  Speech Language Pathology Treatment  Patient Details  Name: Walter [name] MRN: [id number] Date of Birth: July 19, 1955 Referring Provider (SLP): Charlett Blake, MD   Encounter Date: 03/14/2021   End of Session - 03/14/21 1754     Visit Number 22    Number of Visits 17    Date for SLP Re-Evaluation 04/20/21    Authorization Type Medicare    SLP Start Time 1534    SLP Stop Time  2500    SLP Time Calculation (min) 41 min    Activity Tolerance Patient tolerated treatment well             Past Medical History:  Diagnosis Date   Atrial flutter ([place])    s/p ablation   DVT (deep venous thrombosis) ([place])    Near syncope 09/25/2013   Paroxysmal atrial fibrillation ([place])    Stroke [facility])    initially hemorragic (not on [facility]) followed by subsequent embolic stroke    Past Surgical History:  Procedure Laterality Date   A-FLUTTER ABLATION N/A 03/09/2020   Procedure: A-FLUTTER ABLATION;  Surgeon: Thompson Grayer, MD;  Location: [place] CV LAB;  Service: Cardiovascular;  Laterality: N/A;   CARDIOVERSION N/A 09/27/2013   Procedure: CARDIOVERSION;  Surgeon: Lelon Perla, MD;  Location: [facility] ENDOSCOPY;  Service: Cardiovascular;  Laterality: N/A;   IR ANGIO EXTERNAL CAROTID SEL EXT CAROTID UNI R MOD SED  09/26/2020   IR ANGIO INTRA EXTRACRAN SEL COM CAROTID INNOMINATE BILAT MOD SED  09/26/2020   IR ANGIO VERTEBRAL SEL VERTEBRAL UNI R MOD SED  09/26/2020   IR IVC FILTER PLMT / S&I /IMG GUID/MOD SED  10/08/2020   IR IVUS EACH ADDITIONAL NON CORONARY VESSEL  11/02/2020   IR PTA VENOUS EXCEPT DIALYSIS CIRCUIT  11/02/2020   IR RADIOLOGIST EVAL & MGMT  01/17/2021   IR THROMBECT VENO MECH MOD SED  10/27/2020   IR THROMBECT VENO MECH MOD SED  11/02/2020   IR US GUIDE VASC ACCESS LEFT  10/27/2020   IR US GUIDE VASC ACCESS RIGHT  09/26/2020   IR US  GUIDE VASC ACCESS RIGHT  11/02/2020   IR VENO/EXT/UNI RIGHT  11/02/2020   IR VENOCAVAGRAM IVC  10/27/2020   RETINAL DETACHMENT SURGERY     right knee arthroscopy     TEE WITHOUT CARDIOVERSION N/A 09/27/2013   Procedure: TRANSESOPHAGEAL ECHOCARDIOGRAM (TEE);  Surgeon: Lelon Perla, MD;  Location: [facility] ENDOSCOPY;  Service: Cardiovascular;  Laterality: N/A;   WRIST SURGERY      There were no vitals filed for this visit.   Subjective Assessment - 03/14/21 1540     Subjective Pt sitting at rest - SLP can hear vocal fold spasm.    Currently in Pain? No/denies                   ADULT SLP TREATMENT - 03/14/21 1544       General Information   Behavior/Cognition Alert;Cooperative;Pleasant mood;Requires cueing      Treatment Provided   Treatment provided Cognitive-Linquistic      Cognitive-Linquistic Treatment   Treatment focused on Dysarthria;Cognition    Skilled Treatment Pt used chin down posture with sips, spontaneously x4 during session without any overt s/s difficulty. Pt given HEP for overarticulation and produed 20 sentences, reading, 19/20 with overarticulation. Pt was told to read 20 sentences before completing any homework, which SLP  provided for overarticulation, and told to practice 15-20 minutes BID.      Assessment / Recommendations / Plan   Plan Continue with current plan of care      Progression Toward Goals   Progression toward goals Progressing toward goals              SLP Education - 03/14/21 1748     Education Details Do HEP prior to any homework    Person(s) Educated Patient;Spouse    Methods Explanation    Comprehension Verbalized understanding              SLP Short Term Goals - 02/21/21 1552       SLP SHORT TERM GOAL #1   Title Pt will complete dysarthria HEP with occasional min A over 2 sessions    Status Not Met      SLP SHORT TERM GOAL #2   Title Pt will produce loud /a/ or "hey!" with at least high 80s dB average over 2 sessions     Status Not Met      SLP SHORT TERM GOAL #3   Title Pt will generate abdominal breathing in 16/20 answers to SLP questions over 2 sessions    Baseline 01-18-21    Status Partially Met      SLP SHORT TERM GOAL #4   Title Pt will demonstrate dysarthria compensations in 10 minute simple to mod complex conversation to be 75% intelligibile with occasional min A over 2 sessions    Status Not Met      SLP SHORT TERM GOAL #5   Title Pt will complete formal cognitive linguistic assessment in first 1-2 ST sessions    Status Achieved      SLP SHORT TERM GOAL #6   Title Pt will use memory and attention compensations for appointments, medicine management, and other daily activities with occasional min A over 2 sessions    Status Not Met              SLP Long Term Goals - 03/14/21 1754       SLP LONG TERM GOAL #1   Title Pt will complete dysarthria HEP with rare min A over 2 sessions    Time 8    Period Weeks    Status On-going    Target Date 04/20/21      SLP LONG TERM GOAL #2   Title Pt will demonstrate dysarthria compensations in 10 minute simple conversation to be 90% intelligibile with rare min A over 2 sessions    Baseline 02-19-21    Time 8    Period Weeks    Status On-going    Target Date 04/20/21      SLP LONG TERM GOAL #3   Title Pt will demo improved sustained and selective attention during therapeutic tasks with less than 3 cues for active listening during 2 sessions    Time 1    Period Weeks    Status Deferred      SLP LONG TERM GOAL #4   Title Pt will demonstrate emergent and anticipatory awareness of impulsivity with less than 5 cues to modify behaviors during 2 sessions    Status Not Met      SLP LONG TERM GOAL #5   Title Pt will report reduced frustration and improved communication effectiveness via PROM by 2 points at last ST sessions    Baseline CES: 15.5 & V-RQOL:35    Time 8    Period Weeks  Status On-going    Target Date 04/20/21      SLP LONG  TERM GOAL #6   Title Pt will selectively attend to cognitive linguistic task for 5 minutes with 3 or less cues for redirection over 3 sessions    Time 8    Period Weeks    Status On-going    Target Date 04/20/21              Plan - 03/14/21 1754     Clinical Impression Statement "[name]" presents for OPST intervention secondary to stroke in March 2022. Pt worked with SLP continuing in training pt in breath support and compensations for audible, intelligible speech. Pt demonstrated NO COUGHING today with liquids USING A spontaneous chin tuck posture. [name] presents wtih improving accuracy on structured speech tasks with some limited improvement in carryover at conversational level. Skilled ST is cont'd warranted to address dysarthria and cognition to maximize communication effectiveness and optimize functional independence and safety at home. He continues to demonstrate reduced selective attention, processing and problem solving. Continue skilled ST for cognition as well as carryover of audible intelligible speech across settings.    Speech Therapy Frequency 2x / week    Duration --   16 weeks   Treatment/Interventions Cueing hierarchy;Functional tasks;Patient/family education;Cognitive reorganization;Multimodal communcation approach;Language facilitation;Compensatory techniques;Internal/external aids;SLP instruction and feedback    Potential to Achieve Goals Fair    Potential Considerations Ability to learn/carryover information;Cooperation/participation level    SLP Home Exercise Plan provided    Consulted and Agree with Plan of Care Patient             Patient will benefit from skilled therapeutic intervention in order to improve the following deficits and impairments:   Dysarthria and anarthria  Cognitive communication deficit    Problem List Patient Active Problem List   Diagnosis Date Noted   Thrombosis    Sleep disturbance    Dysphagia, post-stroke    PAF (paroxysmal  atrial fibrillation) (HCC)    Acute pulmonary embolism without acute cor pulmonale (HCC)    Malnutrition of moderate degree 11/04/2020   Tracheostomy care ([place])    Pressure injury of skin 10/23/2020   Intracranial hemorrhage (HCC)    Atrial fibrillation ([place])    Prediabetes    Leukocytosis    Acute blood loss anemia    Hypernatremia    ICH (intracerebral hemorrhage) ([place]) 09/25/2020   Unilateral primary osteoarthritis, left knee 09/26/2016   Atrial flutter ([place]) 09/25/2013   Near syncope 09/25/2013    [facility] ,[place], CCC-SLP  03/14/2021, 5:55 PM  [facility] [address] [place], [place], [address] Phone: [phone]   Fax:  [phone]   Name: Walter [name] MRN: [id number] Date of Birth: 15-Oct-1955

## 2021-03-14 NOTE — Therapy (Signed)
[facility] [address] [place], [place], [address] Phone: [phone]   Fax:  [phone]  Occupational Therapy Treatment  Patient Details  Name: Walter GAUTHREAUX MRN: [id number] Date of Birth: [birth year] Referring Provider (OT): Dr. Alysia Penna   Encounter Date: 03/14/2021   OT End of Session - 03/14/21 1556     Visit Number 21    Number of Visits 43    Authorization Type Medicare Part A & B- need to add KX    Authorization Time Period 14    Authorization - Visit Number 21    Authorization - Number of Visits 20    Progress Note Due on Visit 63    OT Start Time 1287    OT Stop Time 1445    OT Time Calculation (min) 42 min             Past Medical History:  Diagnosis Date   Atrial flutter ([place])    s/p ablation   DVT (deep venous thrombosis) ([place])    Near syncope 09/25/2013   Paroxysmal atrial fibrillation ([place])    Stroke [facility])    initially hemorragic (not on [place]) followed by subsequent embolic stroke    Past Surgical History:  Procedure Laterality Date   A-FLUTTER ABLATION N/A 03/09/2020   Procedure: A-FLUTTER ABLATION;  Surgeon: Thompson Grayer, MD;  Location: [facility];  Service: Cardiovascular;  Laterality: N/A;   CARDIOVERSION N/A 09/27/2013   Procedure: CARDIOVERSION;  Surgeon: Lelon Perla, MD;  Location: [facility] ENDOSCOPY;  Service: Cardiovascular;  Laterality: N/A;   IR ANGIO EXTERNAL CAROTID SEL EXT CAROTID UNI R MOD SED  09/26/2020   IR ANGIO INTRA EXTRACRAN SEL COM CAROTID INNOMINATE BILAT MOD SED  09/26/2020   IR ANGIO VERTEBRAL SEL VERTEBRAL UNI R MOD SED  09/26/2020   IR IVC FILTER PLMT / S&I /IMG GUID/MOD SED  10/08/2020   IR IVUS EACH ADDITIONAL NON CORONARY VESSEL  11/02/2020   IR PTA VENOUS EXCEPT DIALYSIS CIRCUIT  11/02/2020   IR RADIOLOGIST EVAL & MGMT  01/17/2021   IR THROMBECT VENO MECH MOD SED  10/27/2020   IR THROMBECT VENO MECH MOD SED  11/02/2020   IR US GUIDE VASC ACCESS LEFT   10/27/2020   IR US GUIDE VASC ACCESS RIGHT  09/26/2020   IR US GUIDE VASC ACCESS RIGHT  11/02/2020   IR VENO/EXT/UNI RIGHT  11/02/2020   IR VENOCAVAGRAM IVC  10/27/2020   RETINAL DETACHMENT SURGERY     right knee arthroscopy     TEE WITHOUT CARDIOVERSION N/A 09/27/2013   Procedure: TRANSESOPHAGEAL ECHOCARDIOGRAM (TEE);  Surgeon: Lelon Perla, MD;  Location: [facility] ENDOSCOPY;  Service: Cardiovascular;  Laterality: N/A;   WRIST SURGERY      There were no vitals filed for this visit.   Subjective Assessment - 03/14/21 1555     Subjective  Denies pain, [place]    Pertinent History ICH of brainstem (complicated by hydrocephalus, DVTs, and tracheostomy).  PMH:  a-fib    Limitations fall risk, ataxia, impulsivity    Patient Stated Goals use L side of body better (decrease ataxia), stay active and heal brain    Currently in Pain? No/denies                   Treatment:Seated pt tied his shoes with min difficulty and increased time using foot stool, min v.c Stacking blocks with bilateral UE's for increased bilateral control,  mod difficulty and min v.c for LUE use. Dealing playing cards with left and right UE's, min v.c and min difficulty with LUE, cueing to compensate for ataxia. Seated on mat functional reaching with trunk rotation , cross body reach to place graded clothespins on vertical antenna, min-mod difficulty  Writing activity to generate foods for each letter of alphabet, min difficulty/ v.c Pt demonstrates improving legibility.                 OT Short Term Goals - 03/12/21 0859       OT SHORT TERM GOAL #1   Title Pt/caregiver will be independent with initial HEP for LUE coordination and R hand strength.--    Time 4    Period Weeks    Status Achieved   needs continued reinforcement, pt is not using consistently   Target Date 03/02/21      OT SHORT TERM GOAL #2   Title Pt/caregiver will be independent with vision HEP and  visual compensation strategies.    Time 4    Period Weeks    Status Achieved   Pt/ family verbalize understanding of vision HEP, needs continued reinforcement of compensatory strategeis- pt is not performing head turns     OT SHORT TERM GOAL #3   Title Pt will improve LUE coordination for ADLs as shown by improving score on box and blocks test by at least 6.    Baseline L-21 blocks    Time 4    Period Weeks    Status On-going   25 blocks.  03/12/21:  24 blocks     OT SHORT TERM GOAL #4   Title Pt will perform simple snack prep/home maintenance task from w/c level mod I.    Time 4    Period Weeks    Status Achieved   met per pt's wife     OT SHORT TERM GOAL #5   Title Pt will verbalize understanding of compensation strategies for sensory deficts for incr safety.    Time 4    Period Weeks    Status Achieved   Pt verbalized that he has to be careful about temperature, needs additional reinforcement.  03/12/21:  met     OT SHORT TERM GOAL #6   Title Pt will improve R hand grip stength to at least 32lbs to assist with ADLs/opening containers.    Time 4    Period Weeks    Status Achieved   33.5     OT SHORT TERM GOAL #7   Title --               OT Long Term Goals - 03/12/21 0904       OT LONG TERM GOAL #1   Title Pt will be independent with updated HEP--    Time 12    Period Weeks    Status On-going      OT LONG TERM GOAL #2   Title Pt will improve L hand coordination for ADLs as shown by completing 9-hole peg test in less than 70sec.    Baseline 107sec    Time 12    Period Weeks    Status On-going   03/12/21:  78sec, 78sec (with multiple drops)     OT LONG TERM GOAL #3   Title Pt will be mod I with toileting.    Time 12    Period Weeks    Status On-going   03/12/21:  supervision/CGA     OT LONG TERM  GOAL #4   Title Pt will perform simple snack prep/home maintenance tasks in standing with supervision.    Time 12    Period Weeks    Status On-going      OT LONG  TERM GOAL #5   Title Pt will demo good safety awareness/compensation for sensory deficits and attention to LUE during functional tasks and transfers without cueing.    Time 12    Period Weeks    Status On-going      OT LONG TERM GOAL #6   Title Pt will perform simple environmental scanning/navigation with at least 90% accuracy for incr safety.    Time 12    Period Weeks    Status On-going   03/12/21:  70% with CGA for balance/ambulation     OT LONG TERM GOAL #7   Title Pt will perform all basic ADLS with no more than supervision.    Time 12    Period Weeks    Status On-going   03/12/21:  min A-supervision                  Plan - 03/14/21 1557     Clinical Impression Statement Pt is progressing towards goals overall with improving coordination and ADL skills.    OT Occupational Profile and History Detailed Assessment- Review of Records and additional review of physical, cognitive, psychosocial history related to current functional performance    Occupational performance deficits (Please refer to evaluation for details): ADL's;IADL's;Work;Leisure;Social Participation    Body Structure / Function / Physical Skills ADL;Strength;Balance;Proprioception;UE functional use;IADL;Endurance;Vision;Mobility;Coordination;Decreased knowledge of precautions;FMC;GMC;Sensation    Cognitive Skills Attention;Safety Awareness;Memory;Perception;Problem Solve    Rehab Potential Good    Clinical Decision Making Several treatment options, min-mod task modification necessary    Comorbidities Affecting Occupational Performance: May have comorbidities impacting occupational performance    Modification or Assistance to Complete Evaluation  Min-Moderate modification of tasks or assist with assess necessary to complete eval    OT Frequency 3x / week   updated frequency may decrease after 4 weeks   OT Duration 12 weeks   written for 12 weeks however may d/c after 8 depenent upon progress.   OT  Treatment/Interventions Self-care/ADL training;Moist Heat;Fluidtherapy;DME and/or AE instruction;Balance training;Therapeutic activities;Aquatic Therapy;Ultrasound;Therapeutic exercise;Cognitive remediation/compensation;Visual/perceptual remediation/compensation;Functional Mobility Training;Neuromuscular education;Cryotherapy;Energy conservation;Manual Therapy;Patient/family education    Plan continue with UE control, standing balance/functional mobility functional reaching,    Consulted and Agree with Plan of Care Family member/caregiver;Patient    Family Member Consulted wife             Patient will benefit from skilled therapeutic intervention in order to improve the following deficits and impairments:   Body Structure / Function / Physical Skills: ADL, Strength, Balance, Proprioception, UE functional use, IADL, Endurance, Vision, Mobility, Coordination, Decreased knowledge of precautions, FMC, GMC, Sensation Cognitive Skills: Attention, Safety Awareness, Memory, Perception, Problem Solve     Visit Diagnosis: No diagnosis found.    Problem List Patient Active Problem List   Diagnosis Date Noted   Thrombosis    Sleep disturbance    Dysphagia, post-stroke    PAF (paroxysmal atrial fibrillation) ([facility])    Acute pulmonary embolism without acute cor pulmonale (HCC)    Malnutrition of moderate degree 11/04/2020   Tracheostomy care ([place])    Pressure injury of skin 10/23/2020   Intracranial hemorrhage (H[facility])    Atrial fibrillation (HCC)    Prediabetes    Leukocytosis    Acute blood loss anemia    Hypernatremia  ICH (intracerebral hemorrhage) ([place]) 09/25/2020   Unilateral primary osteoarthritis, left knee 09/26/2016   Atrial flutter ([place]) 09/25/2013   Near syncope 09/25/2013    Saraih Lorton, OT/L 03/14/2021, 3:58 PM  [facility] [address] [place], [place], [address] Phone: [phone]   Fax:   [phone]  Name: SAVAS ELVIN MRN: [id number] Date of Birth: 11-Mar-1956

## 2021-03-14 NOTE — Therapy (Signed)
OUTPATIENT PHYSICAL THERAPY Progress note/Recertification Note Patient Name: Walter [name] MRN: [id number] DOB:1956/03/07, 65 y.o., male Today's Date: 03/14/2021  PCP: Alroy Dust, L.Marlou Sa, MD REFERRING PROVIDER: Alroy Dust, Carlean Jews.Marlou Sa, MD   PT End of Session - 03/14/21 1438     Visit Number 22    Number of Visits 44    Date for PT Re-Evaluation 05/04/21    Authorization Type Medicare 12/26/20 to 02/20/21; Recert 7/49/44 to 96/75/91; 63W PN/recert on 10/05/63 to 99/3/57    Authorization Time Period 03/09/21-05/04/21    Progress Note Due on Visit 30    PT Start Time 1445    PT Stop Time 1530    PT Time Calculation (min) 45 min    Equipment Utilized During Treatment Gait belt    Activity Tolerance Patient tolerated treatment well    Behavior During Therapy Impulsive                  Past Medical History:  Diagnosis Date   Atrial flutter ([place])    s/p ablation   DVT (deep venous thrombosis) ([facility])    Near syncope 09/25/2013   Paroxysmal atrial fibrillation ([facility])    Stroke [facility])    initially hemorragic (not on [place]) followed by subsequent embolic stroke   Past Surgical History:  Procedure Laterality Date   A-FLUTTER ABLATION N/A 03/09/2020   Procedure: A-FLUTTER ABLATION;  Surgeon: Thompson Grayer, MD;  Location: [facility];  Service: Cardiovascular;  Laterality: N/A;   CARDIOVERSION N/A 09/27/2013   Procedure: CARDIOVERSION;  Surgeon: Lelon Perla, MD;  Location: [facility] ENDOSCOPY;  Service: Cardiovascular;  Laterality: N/A;   IR ANGIO EXTERNAL CAROTID SEL EXT CAROTID UNI R MOD SED  09/26/2020   IR ANGIO INTRA EXTRACRAN SEL COM CAROTID INNOMINATE BILAT MOD SED  09/26/2020   IR ANGIO VERTEBRAL SEL VERTEBRAL UNI R MOD SED  09/26/2020   IR IVC FILTER PLMT / S&I /IMG GUID/MOD SED  10/08/2020   IR IVUS EACH ADDITIONAL NON CORONARY VESSEL  11/02/2020   IR PTA VENOUS EXCEPT DIALYSIS CIRCUIT  11/02/2020   IR RADIOLOGIST EVAL & MGMT  01/17/2021   IR THROMBECT VENO MECH MOD SED  10/27/2020   IR  THROMBECT VENO MECH MOD SED  11/02/2020   IR US GUIDE VASC ACCESS LEFT  10/27/2020   IR US GUIDE VASC ACCESS RIGHT  09/26/2020   IR US GUIDE VASC ACCESS RIGHT  11/02/2020   IR VENO/EXT/UNI RIGHT  11/02/2020   IR VENOCAVAGRAM IVC  10/27/2020   RETINAL DETACHMENT SURGERY     right knee arthroscopy     TEE WITHOUT CARDIOVERSION N/A 09/27/2013   Procedure: TRANSESOPHAGEAL ECHOCARDIOGRAM (TEE);  Surgeon: Lelon Perla, MD;  Location: [facility] ENDOSCOPY;  Service: Cardiovascular;  Laterality: N/A;   WRIST SURGERY     Patient Active Problem List   Diagnosis Date Noted   Thrombosis    Sleep disturbance    Dysphagia, post-stroke    PAF (paroxysmal atrial fibrillation) ([place])    Acute pulmonary embolism without acute cor pulmonale (HCC)    Malnutrition of moderate degree 11/04/2020   Tracheostomy care ([place])    Pressure injury of skin 10/23/2020   Intracranial hemorrhage (H[facility])    Atrial fibrillation ([place])    Prediabetes    Leukocytosis    Acute blood loss anemia    Hypernatremia    ICH (intracerebral hemorrhage) ([place]) 09/25/2020   Unilateral primary osteoarthritis, left knee 09/26/2016   Atrial flutter ([place]) 09/25/2013   Near syncope 09/25/2013  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  Accompanied by wife, relates last session was challenging.  Objective:  Functional tests tracking: Berg Balance scale: eval 11/56, 21/56 (03/09/21) 10 meter walk test: 0.70ms with RW (03/09/21) 5x sit to stand: 17 sec with bil UE support (03/09/21)  TODAY'S TREATMENT:  03/14/21  Seated marching, alt, 6# 15x per LE with latissimus press while seated on green disk to  challenge seated balance  Seated LAQs 6# alt. 15x per LE with latissimus press while seated on green disk to challenge seated balance   STS with OH punch using yellow t-band 10x  SLR circles against red band, 60s each leg in supine position  Ambulation of 2048foutside of clinic with RW with CGA to accommodate facility fire drill  Supine/sit transitions 5x to ea. Side with random stopping points  Quadriped on elbows WS forward and back 10x  03/12/21 Ambulation of 46070fith RW and CGA, cued to pause walking before pivot turning to focus on task at hand and break down tasks into individual  components to control ataxia Seated on green disk, alternating marching and LAQs with lat press to activate core, 3# ankle weights, alternating pattern, 15 reps per LE Seated on disk, forward reaching using RW 10x              Seated core using disk and 3.3# ball using hip/shoulder tosses, chops and Vs for 10 reps per task  STS with OH reach once standing, 10 reps 03/09/21 Gait training: standing up from standard chair with large based quad cane walking up around cone (10 feet away) and sitting back down in chair: 2x, cues for 3step pattern and sequencing, min A to mod A required 1 x 230 feet with RW, CGA to min A with cues for smaller steps BBS performed 5x sit to stand performed.   03/01/2021 Pt seen for aquatic therapy today.  Treatment took place in water 3.25-4.8 ft in depth at the [facility]. Temp of water was 92. entered/exited the pool via stairs step to pattern independently with bilat rail.  Introduced pt to aquatic setting.                           Amb using water walker. forward, backward and side stepping/walking cues for increased step length, posture and weight shifting x 12 minutes. Pt requiring mod to min asst. Multiple LOB.  Seated -sit to stand transfers from water bench submerged to waist when standing.  Cueing for weight shift/execution. Initally completed with water  walker, progressed to thick yellow noodle.  Pt requiring min-cga for completion. 2 x 5 Marching x 10 LAQ x 10.  Standing -step ups x leading with L/R LE.  Cueing for decreased impulsiveness /improved concentration on finishing first movement before moving to next. -toe raises 2 x 10 Cueing for balance/slowing movements  Supine suspension Flutter kicking x 5 min supported by Pt  Pt requires buoyancy for support and to offload joints with strengthening exercises. Viscosity of the water is needed for resistance of strengthening; water current perturbations provides challenge to standing balance unsupported, requiring increased core activation.   02/28/21: Gait training:  1 x 460 feet with tactile cues at pelvis to guide weight shifts cues for slower cadence and turning slowly Standing on floor: OH reaching and fwd reaching: alternating between reaches: 10x each Standing touching toes: 5x Gait training: chair to chair transfer with 180 deg turn: quad cane, CGA to min A: 4x  02/26/21: Gait training: 1 x 420 feet with tactile cues at pelvis to guide weight shifts cues for slower cadence and turning slowly Sit to stand: 10x no HHA Standing at countertop with wide BOS: moving basket with 20lbs from side to side to increase core engagement and weight shifts Picking up basket with 20lb from counter to chest: 5x Working on ankle strategy: standing heel and toe raises without HHA: very close guarding: 2 x 10  Standing contralateral reaching: 10x R and L    PATIENT EDUCATION: Education details: discussed reassessment findings Person educated: Patient and caregiver Education method: Explanation, Demonstration, and Verbal cues Education comprehension: verbalized understanding     HOME EXERCISE PROGRAM: Continue daily walking with CG assist  ASSESSMENT:   CLINICAL IMPRESSION:  Todays session focused on seated balance and core exercises to challenge strength and mobility.  Patient I in bed  mobility including position transitions and rolling.  Used green sitting disk and 6# ankle weights to further challenge balance incorporating functional movements and t-band at UEs to facilitae extension in trunk.  Able to ambulate outside of clinic with RW and CGA to accommodate facility fire drill.   REHAB POTENTIAL: Good   CLINICAL DECISION MAKING: Stable/uncomplicated  GOALS: Goals reviewed with patient? Yes   SHORT TERM GOALS:   STG Name Target Date Goal status  1 Patient will be able to perform chair to chair transfer with CGA/SBA with use of RW for safety to improve independence. Baseline: min to mod A (12/26/20); CGA/minA (03/09/21) 01/23/2021 02/22/21 Able to transfer STS and stand pivot with SBA/CGA  2 Pt will be able to ambulate 115' with RW with CGA to min A with RW for safety to improve independence Baseline: min to mod A with RW 145'; 230 feet with RW and CGA to MIN A 01/23/2021 03/09/21 Goal met  3 Pt will be able to maintain standing balance for 30 sec to improve static balance Baseline: 10 sec before needing help due to LOB (12/26/20) 01/23/2021 02/05/21 able to stand with S for 30s, goal met  4. Pt will be able to perform sit to stand transfers with SBA to improve independence 04/06/21 Revised 03/09/21  5. Pt will be able to perform 230 feet of walking with RW with SBA/CGA to improve household ambulation  04/06/21 Revised 03/09/21    LONG TERM GOALS:   LTG Name Target Date Goal status  1 Pt will demo >5 points improvement on Berg Balance Scale to improve static balance Baseline: 11/56 01/03/21 02/20/2021 INITIAL 03/09/21 BBS 21/56  2 Pt will be able to ambulate >500 feet with RW and SBA to improve walking endurance and safety. Baseline: 145' with RW min to mod A; 230 feet with CGA to Min A (03/09/21)  05/18/21 Progressing continue   3 Pt will be able to perform gait speed improvement by 0.101ms to improve community ambulation. Baseline:0.29m (03/09/21) 05/16/21 Progressing continue  4 Pt  will demo 5 sec improvement in 5x sit to stand (with or without UE support) to improve functional strength Baseline: 5x sit to stand with bil UE 17 sec;  05/18/21 Progressing continue    PLAN: PT FREQUENCY: 2-3x/week   PT  DURATION: 8 weeks   PLANNED INTERVENTIONS: Aquatic therapy, Therapeutic exercises, Therapeutic activity, Neuro Muscular re-education, Balance training, Gait training, Patient/Family education, Joint mobilization, Stair training, Visual/preceptual remediation/compensation, Wheelchair mobility training, Cryotherapy, Moist heat, and Manual therapy   PLAN FOR NEXT SESSION:  Balance challenges progressed with seated core strengthening and tall kneel tasks as knees tolerate Standing/walking tasks in // bars, floor targets and stepping tasks, higher level gait training-stepping over and around obstacles, resisted walking, HEP f/u  Visit Diagnosis: Ataxia  Muscle weakness (generalized)  Unsteadiness on feet     Problem List Patient Active Problem List   Diagnosis Date Noted   Thrombosis    Sleep disturbance    Dysphagia, post-stroke    PAF (paroxysmal atrial fibrillation) ([place])    Acute pulmonary embolism without acute cor pulmonale (HCC)    Malnutrition of moderate degree 11/04/2020   Tracheostomy care ([place])    Pressure injury of skin 10/23/2020   Intracranial hemorrhage (HCC)    Atrial fibrillation (HCC)    Prediabetes    Leukocytosis    Acute blood loss anemia    Hypernatremia    ICH (intracerebral hemorrhage) ([place]) 09/25/2020   Unilateral primary osteoarthritis, left knee 09/26/2016   Atrial flutter ([place]) 09/25/2013   Near syncope 09/25/2013    Lanice Shirts, PT 03/14/2021, 2:46 PM  [facility] [address] [place], [place], [address] Phone: [phone]   Fax:  [phone]

## 2021-03-14 NOTE — Patient Instructions (Signed)
  Read 20 sentences on those 4 sheets prior to doing any speech homework.

## 2021-03-16 ENCOUNTER — Ambulatory Visit (HOSPITAL_BASED_OUTPATIENT_CLINIC_OR_DEPARTMENT_OTHER): Payer: Medicare Other | Admitting: Physical Therapy

## 2021-03-16 ENCOUNTER — Encounter (HOSPITAL_BASED_OUTPATIENT_CLINIC_OR_DEPARTMENT_OTHER): Payer: Self-pay | Admitting: Physical Therapy

## 2021-03-16 ENCOUNTER — Other Ambulatory Visit: Payer: Self-pay

## 2021-03-16 DIAGNOSIS — I639 Cerebral infarction, unspecified: Secondary | ICD-10-CM

## 2021-03-16 DIAGNOSIS — R471 Dysarthria and anarthria: Secondary | ICD-10-CM

## 2021-03-16 DIAGNOSIS — R278 Other lack of coordination: Secondary | ICD-10-CM

## 2021-03-16 DIAGNOSIS — R270 Ataxia, unspecified: Secondary | ICD-10-CM

## 2021-03-16 DIAGNOSIS — R41841 Cognitive communication deficit: Secondary | ICD-10-CM

## 2021-03-16 DIAGNOSIS — M6281 Muscle weakness (generalized): Secondary | ICD-10-CM

## 2021-03-16 DIAGNOSIS — R2681 Unsteadiness on feet: Secondary | ICD-10-CM

## 2021-03-16 DIAGNOSIS — R41844 Frontal lobe and executive function deficit: Secondary | ICD-10-CM

## 2021-03-16 DIAGNOSIS — R208 Other disturbances of skin sensation: Secondary | ICD-10-CM

## 2021-03-16 NOTE — Therapy (Signed)
OUTPATIENT PHYSICAL THERAPY Progress note/Recertification Note Patient Name: Walter [name] MRN: [id number] DOB:22-Jun-1956, 65 y.o., male Today's Date: 03/16/2021  PCP: Alroy Dust, L.Marlou Sa, MD REFERRING PROVIDER: Alroy Dust, Carlean Jews.Marlou Sa, MD   PT End of Session - 03/16/21 1046     Visit Number 23    Number of Visits 44    Date for PT Re-Evaluation 05/04/21    Authorization Type Medicare 12/26/20 to 02/20/21; Recert 10/18/35 to 90/24/09; 73Z PN/recert on 08/30/97 to 24/2/68    Authorization Time Period 03/09/21-05/04/21    Progress Note Due on Visit 30    PT Start Time 0950    PT Stop Time 1035    PT Time Calculation (min) 45 min    Equipment Utilized During Treatment Gait belt    Activity Tolerance Patient tolerated treatment well    Behavior During Therapy Impulsive                  Past Medical History:  Diagnosis Date   Atrial flutter ([facility])    s/p ablation   DVT (deep venous thrombosis) (HCC)    Near syncope 09/25/2013   Paroxysmal atrial fibrillation (HCC)    Stroke [facility])    initially hemorragic (not on [place]) followed by subsequent embolic stroke   Past Surgical History:  Procedure Laterality Date   A-FLUTTER ABLATION N/A 03/09/2020   Procedure: A-FLUTTER ABLATION;  Surgeon: Thompson Grayer, MD;  Location: [facility] [facility];  Service: Cardiovascular;  Laterality: N/A;   CARDIOVERSION N/A 09/27/2013   Procedure: CARDIOVERSION;  Surgeon: Lelon Perla, MD;  Location: [facility] ENDOSCOPY;  Service: Cardiovascular;  Laterality: N/A;   IR ANGIO EXTERNAL CAROTID SEL EXT CAROTID UNI R MOD SED  09/26/2020   IR ANGIO INTRA EXTRACRAN SEL COM CAROTID INNOMINATE BILAT MOD SED  09/26/2020   IR ANGIO VERTEBRAL SEL VERTEBRAL UNI R MOD SED  09/26/2020   IR IVC FILTER PLMT / S&I /IMG GUID/MOD SED  10/08/2020   IR IVUS EACH ADDITIONAL NON CORONARY VESSEL  11/02/2020   IR PTA VENOUS EXCEPT DIALYSIS CIRCUIT  11/02/2020   IR RADIOLOGIST EVAL & MGMT  01/17/2021   IR THROMBECT VENO MECH MOD SED  10/27/2020   IR  THROMBECT VENO MECH MOD SED  11/02/2020   IR US GUIDE VASC ACCESS LEFT  10/27/2020   IR US GUIDE VASC ACCESS RIGHT  09/26/2020   IR US GUIDE VASC ACCESS RIGHT  11/02/2020   IR VENO/EXT/UNI RIGHT  11/02/2020   IR VENOCAVAGRAM IVC  10/27/2020   RETINAL DETACHMENT SURGERY     right knee arthroscopy     TEE WITHOUT CARDIOVERSION N/A 09/27/2013   Procedure: TRANSESOPHAGEAL ECHOCARDIOGRAM (TEE);  Surgeon: Lelon Perla, MD;  Location: [facility] ENDOSCOPY;  Service: Cardiovascular;  Laterality: N/A;   WRIST SURGERY     Patient Active Problem List   Diagnosis Date Noted   Thrombosis    Sleep disturbance    Dysphagia, post-stroke    PAF (paroxysmal atrial fibrillation) ([place])    Acute pulmonary embolism without acute cor pulmonale (HCC)    Malnutrition of moderate degree 11/04/2020   Tracheostomy care ([place])    Pressure injury of skin 10/23/2020   Intracranial hemorrhage (HCC)    Atrial fibrillation ([place])    Prediabetes    Leukocytosis    Acute blood loss anemia    Hypernatremia    ICH (intracerebral hemorrhage) ([place]) 09/25/2020   Unilateral primary osteoarthritis, left knee 09/26/2016   Atrial flutter ([place]) 09/25/2013   Near syncope 09/25/2013  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  Accompanied by CG.  Objective:  Functional tests tracking: Berg Balance scale: eval 11/56, 21/56 (03/09/21) 10 meter walk  test: 0.5ms with RW (03/09/21) 5x sit to stand: 17 sec with bil UE support (03/09/21)  TODAY'S TREATMENT:              03/16/21              Pt seen for aquatic therapy today.  Treatment took place in water 3.25-4.8 [facility] Temp of water was 92.     entered/exited the pool via stairs step to pattern independently with bilat rail.                         Amb using water walker. forward, backward and side stepping/walking cues for increased step length, posture, weight shifting maintaining direct path x 15 minutes. Decreased assistance needed today, min.   Seated -sit to stand transfers from water bench submerged to waist when standing.  Cueing for weight shift/execution then gaining and maintaining standing balance. Initally completed yellow noodle progressed to without UE support.  Pt requiring cga to close sba - core engagement/strengthening: sitting on edge of bench, cues for tightened abdominals and glute, gaining balance, lifting feet from floor, maintaining position while flutter kicking 4 trials of 20 reps.  Standing -marching 2 x 10 -add/abd 2 x 10 Hip flex 2 x 10 -toe raises 2 x 10 Cueing for balance/slowing movements  Supine suspension Flutter kicking x 2 min supported by therapist Pushing off of wall, LE bilaterally x 5 reps then LLE only x 5 reps, facilitated by therapist  Pt amb using yellow noodle for support x 30 ft with min-cga, 2 minor LOB episodes.  Pt requires buoyancy for support and to offload joints with strengthening exercises. Viscosity of the water is needed for resistance of strengthening; water current perturbations provides challenge to standing balance unsupported, requiring increased core activation.     03/14/21  Seated marching, alt, 6# 15x per LE with latissimus press while seated on green disk to challenge seated balance  Seated LAQs 6# alt. 15x per LE with latissimus press while seated on green disk to challenge  seated balance   STS with OH punch using yellow t-band 10x  SLR circles against red band, 60s each leg in supine position  Ambulation of 2092foutside of clinic with RW with CGA to accommodate facility fire drill  Supine/sit transitions 5x to ea. Side with random stopping points  Quadriped on elbows WS forward and back 10x  03/12/21 Ambulation of 46085fith RW and CGA, cued to pause walking before pivot turning to focus on task at hand and break down tasks into individual  components to control ataxia Seated on green disk, alternating marching and LAQs with lat press to activate core, 3# ankle weights, alternating pattern, 15 reps per LE Seated on disk, forward reaching using RW 10x  Seated core using disk and 3.3# ball using hip/shoulder tosses, chops and Vs for 10 reps per task  STS with OH reach once standing, 10 reps 03/09/21 Gait training: standing up from standard chair with large based quad cane walking up around cone (10 feet away) and sitting back down in chair: 2x, cues for 3step pattern and sequencing, min A to mod A required 1 x 230 feet with RW, CGA to min A with cues for smaller steps BBS performed 5x sit to stand performed.   03/01/2021 Pt seen for aquatic therapy today.  Treatment took place in water 3.25-4.8 ft in depth at the [employer] pool. Temp of water was 92. entered/exited the pool via stairs step to pattern independently with bilat rail.  Introduced pt to aquatic setting.                           Amb using water walker. forward, backward and side stepping/walking cues for increased step length, posture and weight shifting x 12 minutes. Pt requiring mod to min asst. Multiple LOB.  Seated -sit to stand transfers from water bench submerged to waist when standing.  Cueing for weight shift/execution. Initally completed with water walker, progressed to thick yellow noodle.  Pt requiring min-cga for completion. 2 x 5 Marching x 10 LAQ x  10.  Standing -step ups x leading with L/R LE.  Cueing for decreased impulsiveness /improved concentration on finishing first movement before moving to next. -toe raises 2 x 10 Cueing for balance/slowing movements  Supine suspension Flutter kicking x 5 min supported by Pt  Pt requires buoyancy for support and to offload joints with strengthening exercises. Viscosity of the water is needed for resistance of strengthening; water current perturbations provides challenge to standing balance unsupported, requiring increased core activation.   02/28/21: Gait training: 1 x 460 feet with tactile cues at pelvis to guide weight shifts cues for slower cadence and turning slowly Standing on floor: OH reaching and fwd reaching: alternating between reaches: 10x each Standing touching toes: 5x Gait training: chair to chair transfer with 180 deg turn: quad cane, CGA to min A: 4x  02/26/21: Gait training: 1 x 420 feet with tactile cues at pelvis to guide weight shifts cues for slower cadence and turning slowly Sit to stand: 10x no HHA Standing at countertop with wide BOS: moving basket with 20lbs from side to side to increase core engagement and weight shifts Picking up basket with 20lb from counter to chest: 5x Working on ankle strategy: standing heel and toe raises without HHA: very close guarding: 2 x 10  Standing contralateral reaching: 10x R and L    PATIENT EDUCATION: Education details: discussed reassessment findings Person educated: Patient and caregiver Education method: Explanation, Demonstration, and Verbal cues Education comprehension: verbalized understanding     HOME EXERCISE PROGRAM: Continue daily walking with CG assist  ASSESSMENT:   CLINICAL IMPRESSION:  Focused on core stregth, gait training and sit to stand transfers.  He is less impulsive today following verbal commands well.  Cueing/instruction on weight shift with sit <>stand. With repitition pt completes without LOB or UE  support in pool. Hip hinge exaggerated rising and lowering.   REHAB POTENTIAL: Good   CLINICAL DECISION MAKING: Stable/uncomplicated  GOALS: Goals reviewed with patient? Yes   SHORT TERM GOALS:   STG Name Target Date Goal status  1 Patient will be able to perform chair to chair transfer with CGA/SBA with use  of RW for safety to improve independence. Baseline: min to mod A (12/26/20); CGA/minA (03/09/21) 01/23/2021 02/22/21 Able to transfer STS and stand pivot with SBA/CGA  2 Pt will be able to ambulate 115' with RW with CGA to min A with RW for safety to improve independence Baseline: min to mod A with RW 145'; 230 feet with RW and CGA to MIN A 01/23/2021 03/09/21 Goal met  3 Pt will be able to maintain standing balance for 30 sec to improve static balance Baseline: 10 sec before needing help due to LOB (12/26/20) 01/23/2021 02/05/21 able to stand with S for 30s, goal met  4. Pt will be able to perform sit to stand transfers with SBA to improve independence 04/06/21 Revised 03/09/21  5. Pt will be able to perform 230 feet of walking with RW with SBA/CGA to improve household ambulation  04/06/21 Revised 03/09/21    LONG TERM GOALS:   LTG Name Target Date Goal status  1 Pt will demo >5 points improvement on Berg Balance Scale to improve static balance Baseline: 11/56 01/03/21 02/20/2021 INITIAL 03/09/21 BBS 21/56  2 Pt will be able to ambulate >500 feet with RW and SBA to improve walking endurance and safety. Baseline: 145' with RW min to mod A; 230 feet with CGA to Min A (03/09/21)  05/18/21 Progressing continue   3 Pt will be able to perform gait speed improvement by 0.31ms to improve community ambulation. Baseline:0.251m (03/09/21) 05/16/21 Progressing continue  4 Pt will demo 5 sec improvement in 5x sit to stand (with or without UE support) to improve functional strength Baseline: 5x sit to stand with bil UE 17 sec;  05/18/21 Progressing continue    PLAN: PT FREQUENCY: 2-3x/week   PT DURATION: 8  weeks   PLANNED INTERVENTIONS: Aquatic therapy, Therapeutic exercises, Therapeutic activity, Neuro Muscular re-education, Balance training, Gait training, Patient/Family education, Joint mobilization, Stair training, Visual/preceptual remediation/compensation, Wheelchair mobility training, Cryotherapy, Moist heat, and Manual therapy   PLAN FOR NEXT SESSION:  Balance challenges progressed with seated core strengthening and tall kneel tasks as knees tolerate Standing/walking tasks in // bars, floor targets and stepping tasks, higher level gait training-stepping over and around obstacles, resisted walking, HEP f/u  Visit Diagnosis: Dysarthria and anarthria  Cognitive communication deficit  Ataxia  Muscle weakness (generalized)  Unsteadiness on feet  Other lack of coordination  Other disturbances of skin sensation  Frontal lobe and executive function deficit  Cerebrovascular accident (CVA), unspecified mechanism ([facility]    Problem List Patient Active Problem List   Diagnosis Date Noted   Thrombosis    Sleep disturbance    Dysphagia, post-stroke    PAF (paroxysmal atrial fibrillation) ([facility]   Acute pulmonary embolism without acute cor pulmonale ([facility]   Malnutrition of moderate degree 11/04/2020   Tracheostomy care ([facility]   Pressure injury of skin 10/23/2020   Intracranial hemorrhage (HCC)    Atrial fibrillation ([facility]   Prediabetes    Leukocytosis    Acute blood loss anemia    Hypernatremia    ICH (intracerebral hemorrhage) ([facility]03/28/2022   Unilateral primary osteoarthritis, left knee 09/26/2016   Atrial flutter ([facility]03/28/2015   Near syncope 09/25/2013    MaVedia PereyraPT 03/16/2021, 1:22 PM  [facility] [address]hone: [phone] Fax:  [phone]

## 2021-03-16 NOTE — Therapy (Signed)
[facility] [address] [place], [place], [address] Phone: [phone]   Fax:  [phone]  Physical Therapy Treatment  Patient Details  Name: Walter [name] MRN: [id number] Date of Birth: 1955/09/24 No data recorded  Encounter Date: 03/16/2021   PT End of Session - 03/16/21 1046     Visit Number 23    Number of Visits 44    Date for PT Re-Evaluation 05/04/21    Authorization Type Medicare 12/26/20 to 02/20/21; Recert 02/21/21 to 04/26/21; 10v PN/recert on 03/09/21 to 05/04/21    Authorization Time Period 03/09/21-05/04/21    Progress Note Due on Visit 30    PT Start Time 0950    PT Stop Time 1035    PT Time Calculation (min) 45 min    Equipment Utilized During Treatment Gait belt    Activity Tolerance Patient tolerated treatment well    Behavior During Therapy Impulsive             Past Medical History:  Diagnosis Date   Atrial flutter (HCC)    s/p ablation   DVT (deep venous thrombosis) (HCC)    Near syncope 09/25/2013   Paroxysmal atrial fibrillation (HCC)    Stroke [facility])    initially hemorragic (not on [facility]) followed by subsequent embolic stroke    Past Surgical History:  Procedure Laterality Date   A-FLUTTER ABLATION N/A 03/09/2020   Procedure: A-FLUTTER ABLATION;  Surgeon: Hillis Range, MD;  Location: [facility] INVASIVE CV LAB;  Service: Cardiovascular;  Laterality: N/A;   CARDIOVERSION N/A 09/27/2013   Procedure: CARDIOVERSION;  Surgeon: Lewayne Bunting, MD;  Location: [facility] ENDOSCOPY;  Service: Cardiovascular;  Laterality: N/A;   IR ANGIO EXTERNAL CAROTID SEL EXT CAROTID UNI R MOD SED  09/26/2020   IR ANGIO INTRA EXTRACRAN SEL COM CAROTID INNOMINATE BILAT MOD SED  09/26/2020   IR ANGIO VERTEBRAL SEL VERTEBRAL UNI R MOD SED  09/26/2020   IR IVC FILTER PLMT / S&I /IMG GUID/MOD SED  10/08/2020   IR IVUS EACH ADDITIONAL NON CORONARY VESSEL  11/02/2020   IR PTA VENOUS EXCEPT DIALYSIS CIRCUIT  11/02/2020   IR RADIOLOGIST EVAL & MGMT   01/17/2021   IR THROMBECT VENO MECH MOD SED  10/27/2020   IR THROMBECT VENO MECH MOD SED  11/02/2020   IR US GUIDE VASC ACCESS LEFT  10/27/2020   IR US GUIDE VASC ACCESS RIGHT  09/26/2020   IR US GUIDE VASC ACCESS RIGHT  11/02/2020   IR VENO/EXT/UNI RIGHT  11/02/2020   IR VENOCAVAGRAM IVC  10/27/2020   RETINAL DETACHMENT SURGERY     right knee arthroscopy     TEE WITHOUT CARDIOVERSION N/A 09/27/2013   Procedure: TRANSESOPHAGEAL ECHOCARDIOGRAM (TEE);  Surgeon: Lewayne Bunting, MD;  Location: [facility] ENDOSCOPY;  Service: Cardiovascular;  Laterality: N/A;   WRIST SURGERY      There were no vitals filed for this visit.   Subjective Assessment - 03/16/21 1047     Subjective " no pain, needs more appointments for aquatics"                                                    Plan - 03/16/21 1049     Clinical Impression Statement Focused on core stregth, gait training and sit to stand transfers.  He is less impulsive today following verbal commands  well.  Cueing/instruction on weight shift with sit <>stand. With repitition pt completes without LOB or UE support in pool. Hip hinge exaggerated rising and lowering.    Personal Factors and Comorbidities Age;Comorbidity 2    Examination-Activity Limitations Bathing;Dressing;Hygiene/Grooming;Bed Mobility;Lift;Locomotion Level;Stairs;Squat;Stand;Transfers;Carry    Examination-Participation Restrictions Church;Community Activity;Driving;Medication Management;Laundry;Shop    Stability/Clinical Decision Making Stable/Uncomplicated    Clinical Decision Making Moderate    Rehab Potential Good    PT Frequency 3x / week    PT Treatment/Interventions Aquatic Therapy;ADLs/Self Care Home Management;Gait training;Stair training;Functional mobility training;Therapeutic activities;Therapeutic exercise;Balance training;Neuromuscular re-education;Wheelchair mobility training;Patient/family education;Energy conservation    PT Next Visit  Plan Aquatics: core strengthening    PT Home Exercise Plan sit<>stand             Patient will benefit from skilled therapeutic intervention in order to improve the following deficits and impairments:  Abnormal gait, Decreased balance, Decreased endurance, Decreased mobility, Difficulty walking, Decreased activity tolerance, Decreased coordination, Decreased strength, Impaired UE functional use, Impaired vision/preception, Impaired tone, Decreased knowledge of precautions  Visit Diagnosis: Dysarthria and anarthria  Cognitive communication deficit  Ataxia  Muscle weakness (generalized)  Unsteadiness on feet  Other lack of coordination  Other disturbances of skin sensation  Frontal lobe and executive function deficit  Cerebrovascular accident (CVA), unspecified mechanism (HCC)     Problem List Patient Active Problem List   Diagnosis Date Noted   Thrombosis    Sleep disturbance    Dysphagia, post-stroke    PAF (paroxysmal atrial fibrillation) (HCC)    Acute pulmonary embolism without acute cor pulmonale (HCC)    Malnutrition of moderate degree 11/04/2020   Tracheostomy care (HCC)    Pressure injury of skin 10/23/2020   Intracranial hemorrhage (HCC)    Atrial fibrillation (HCC)    Prediabetes    Leukocytosis    Acute blood loss anemia    Hypernatremia    ICH (intracerebral hemorrhage) (HCC) 09/25/2020   Unilateral primary osteoarthritis, left knee 09/26/2016   Atrial flutter (HCC) 09/25/2013   Near syncope 09/25/2013    Jeanmarie Hubert, PT 03/16/2021, 10:59 AM  [facility] [address] [place], [place], [address] Phone: [phone]   Fax:  [phone]  Name: Walter [name] MRN: [id number] Date of Birth: [birth year]

## 2021-03-19 ENCOUNTER — Inpatient Hospital Stay: Payer: Medicare Other | Admitting: Neurology

## 2021-03-20 ENCOUNTER — Ambulatory Visit: Payer: Medicare Other | Admitting: Occupational Therapy

## 2021-03-20 ENCOUNTER — Encounter: Payer: Self-pay | Admitting: Occupational Therapy

## 2021-03-20 ENCOUNTER — Other Ambulatory Visit: Payer: Self-pay

## 2021-03-20 ENCOUNTER — Ambulatory Visit: Payer: Medicare Other

## 2021-03-20 DIAGNOSIS — R2681 Unsteadiness on feet: Secondary | ICD-10-CM | POA: Diagnosis not present

## 2021-03-20 DIAGNOSIS — I69218 Other symptoms and signs involving cognitive functions following other nontraumatic intracranial hemorrhage: Secondary | ICD-10-CM

## 2021-03-20 DIAGNOSIS — I639 Cerebral infarction, unspecified: Secondary | ICD-10-CM

## 2021-03-20 DIAGNOSIS — R278 Other lack of coordination: Secondary | ICD-10-CM

## 2021-03-20 DIAGNOSIS — R208 Other disturbances of skin sensation: Secondary | ICD-10-CM

## 2021-03-20 DIAGNOSIS — M6281 Muscle weakness (generalized): Secondary | ICD-10-CM

## 2021-03-20 DIAGNOSIS — R270 Ataxia, unspecified: Secondary | ICD-10-CM

## 2021-03-20 DIAGNOSIS — R41844 Frontal lobe and executive function deficit: Secondary | ICD-10-CM

## 2021-03-20 DIAGNOSIS — R471 Dysarthria and anarthria: Secondary | ICD-10-CM

## 2021-03-20 DIAGNOSIS — R41841 Cognitive communication deficit: Secondary | ICD-10-CM

## 2021-03-20 NOTE — Therapy (Signed)
[facility] [address] [place], [place], [address] Phone: [phone]   Fax:  [phone]  Speech Language Pathology Treatment  Patient Details  Name: Walter [name] MRN: [id number] Date of Birth: August 01, 1955 Referring Provider (SLP): Charlett Blake, MD   Encounter Date: 03/20/2021   End of Session - 03/20/21 1517     Visit Number 23    Number of Visits 33    Date for SLP Re-Evaluation 04/20/21    Authorization Type Medicare    SLP Start Time 1406    SLP Stop Time  1446    SLP Time Calculation (min) 40 min    Activity Tolerance Patient tolerated treatment well             Past Medical History:  Diagnosis Date   Atrial flutter ([place])    s/p ablation   DVT (deep venous thrombosis) ([place])    Near syncope 09/25/2013   Paroxysmal atrial fibrillation ([place])    Stroke [facility])    initially hemorragic (not on [facility]) followed by subsequent embolic stroke    Past Surgical History:  Procedure Laterality Date   A-FLUTTER ABLATION N/A 03/09/2020   Procedure: A-FLUTTER ABLATION;  Surgeon: Thompson Grayer, MD;  Location: [facility] CV LAB;  Service: Cardiovascular;  Laterality: N/A;   CARDIOVERSION N/A 09/27/2013   Procedure: CARDIOVERSION;  Surgeon: Lelon Perla, MD;  Location: [facility] ENDOSCOPY;  Service: Cardiovascular;  Laterality: N/A;   IR ANGIO EXTERNAL CAROTID SEL EXT CAROTID UNI R MOD SED  09/26/2020   IR ANGIO INTRA EXTRACRAN SEL COM CAROTID INNOMINATE BILAT MOD SED  09/26/2020   IR ANGIO VERTEBRAL SEL VERTEBRAL UNI R MOD SED  09/26/2020   IR IVC FILTER PLMT / S&I /IMG GUID/MOD SED  10/08/2020   IR IVUS EACH ADDITIONAL NON CORONARY VESSEL  11/02/2020   IR PTA VENOUS EXCEPT DIALYSIS CIRCUIT  11/02/2020   IR RADIOLOGIST EVAL & MGMT  01/17/2021   IR THROMBECT VENO MECH MOD SED  10/27/2020   IR THROMBECT VENO MECH MOD SED  11/02/2020   IR US GUIDE VASC ACCESS LEFT  10/27/2020   IR US GUIDE VASC ACCESS RIGHT  09/26/2020   IR US  GUIDE VASC ACCESS RIGHT  11/02/2020   IR VENO/EXT/UNI RIGHT  11/02/2020   IR VENOCAVAGRAM IVC  10/27/2020   RETINAL DETACHMENT SURGERY     right knee arthroscopy     TEE WITHOUT CARDIOVERSION N/A 09/27/2013   Procedure: TRANSESOPHAGEAL ECHOCARDIOGRAM (TEE);  Surgeon: Lelon Perla, MD;  Location: [facility] ENDOSCOPY;  Service: Cardiovascular;  Laterality: N/A;   WRIST SURGERY      There were no vitals filed for this visit.   Subjective Assessment - 03/20/21 1409     Subjective Wife Walter [name] accompanies pt today.    Patient is accompained by: Family member   Walter [name] - wife   Currently in Pain? No/denies                   ADULT SLP TREATMENT - 03/20/21 1413       General Information   Behavior/Cognition Alert;Cooperative;Pleasant mood;Requires cueing      Treatment Provided   Treatment provided Cognitive-Linquistic      Cognitive-Linquistic Treatment   Treatment focused on Dysarthria;Cognition    Skilled Treatment Pt told SLP he had been in OT and PT already. He read 20 sentences with necessary repeats x2, wihtout awareness. Walter [name] states she has to ask Walter [name] to repeat most often at night when  pt is fatigued. SLP reiterated to pt he will need to ensure he enunciates at night. SLP and pt engaged in mod complex conversation about what he could do in order to ensure everyone uses the same sequence for the remote so it does not have to be reset each time he wants to use it. He was 95-100% intelligible and req'd mod-max cues for a plausible option (decr'd reasoning for novel info). Pt spoke with exaggeration when he sequenced pictures although he needed rare min A with reasoning for the picture's sequence.      Assessment / Recommendations / Plan   Plan Continue with current plan of care      Progression Toward Goals   Progression toward goals Progressing toward goals              SLP Education - 03/20/21 1517     Education Details Pt will need to enunciate when it is later at night     Person(s) Educated Patient;Spouse    Methods Explanation    Comprehension Verbalized understanding;Returned demonstration;Need further instruction              SLP Short Term Goals - 02/21/21 1552       SLP SHORT TERM GOAL #1   Title Pt will complete dysarthria HEP with occasional min A over 2 sessions    Status Not Met      SLP SHORT TERM GOAL #2   Title Pt will produce loud /a/ or "hey!" with at least high 80s dB average over 2 sessions    Status Not Met      SLP SHORT TERM GOAL #3   Title Pt will generate abdominal breathing in 16/20 answers to SLP questions over 2 sessions    Baseline 01-18-21    Status Partially Met      SLP SHORT TERM GOAL #4   Title Pt will demonstrate dysarthria compensations in 10 minute simple to mod complex conversation to be 75% intelligibile with occasional min A over 2 sessions    Status Not Met      SLP SHORT TERM GOAL #5   Title Pt will complete formal cognitive linguistic assessment in first 1-2 ST sessions    Status Achieved      SLP SHORT TERM GOAL #6   Title Pt will use memory and attention compensations for appointments, medicine management, and other daily activities with occasional min A over 2 sessions    Status Not Met              SLP Long Term Goals - 03/20/21 1518       SLP LONG TERM GOAL #1   Title Pt will complete dysarthria HEP with rare min A over 2 sessions    Baseline 03-20-21    Time 8    Period Weeks    Status On-going    Target Date 04/20/21      SLP LONG TERM GOAL #2   Title Pt will demonstrate dysarthria compensations in 10 minute simple conversation to be 90% intelligibile with rare min A over 3 sessions    Time 8    Period Weeks    Status Revised   achieved, and revised for 10 minutes simple to mod complex conversation over 3 sessions   Target Date 04/20/21      SLP LONG TERM GOAL #3   Title Pt will demo improved sustained and selective attention during therapeutic tasks with less than 3 cues  for active listening during  2 sessions    Time 1    Period Weeks    Status Deferred      SLP LONG TERM GOAL #4   Title Pt will demonstrate emergent and anticipatory awareness of impulsivity with less than 5 cues to modify behaviors during 2 sessions    Status On-going   and ongoing   Target Date 04/20/21      SLP LONG TERM GOAL #5   Title Pt will report reduced frustration and improved communication effectiveness via PROM by 2 points at last ST sessions    Baseline CES: 15.5 & V-RQOL:35    Time 8    Period Weeks    Status On-going    Target Date 04/20/21      SLP LONG TERM GOAL #6   Title Pt will selectively attend to cognitive linguistic task for 5 minutes with 3 or less cues for redirection over 3 sessions    Time 8    Period Weeks    Status On-going    Target Date 04/20/21              Plan - 03/20/21 1518     Clinical Impression Statement "Walter [name]" presents for OPST intervention secondary to stroke in March 2022. Pt worked with SLP continuing in training pt in breath support and compensations for audible, intelligible speech. Skilled ST is cont'd warranted to address dysarthria and cognition to maximize communication effectiveness and optimize functional independence and safety at home. He continues to demonstrate reduced selective attention, processing and problem solving. Continue skilled ST for cognition as well as carryover of audible intelligible speech across settings.    Speech Therapy Frequency 2x / week    Duration --   16 weeks   Treatment/Interventions Cueing hierarchy;Functional tasks;Patient/family education;Cognitive reorganization;Multimodal communcation approach;Language facilitation;Compensatory techniques;Internal/external aids;SLP instruction and feedback    Potential to Achieve Goals Fair    Potential Considerations Ability to learn/carryover information;Cooperation/participation level    SLP Home Exercise Plan provided    Consulted and Agree with Plan  of Care Patient             Patient will benefit from skilled therapeutic intervention in order to improve the following deficits and impairments:   Dysarthria and anarthria  Cognitive communication deficit    Problem List Patient Active Problem List   Diagnosis Date Noted   Thrombosis    Sleep disturbance    Dysphagia, post-stroke    PAF (paroxysmal atrial fibrillation) (HCC)    Acute pulmonary embolism without acute cor pulmonale (HCC)    Malnutrition of moderate degree 11/04/2020   Tracheostomy care ([place])    Pressure injury of skin 10/23/2020   Intracranial hemorrhage (HCC)    Atrial fibrillation ([place])    Prediabetes    Leukocytosis    Acute blood loss anemia    Hypernatremia    ICH (intracerebral hemorrhage) ([place]) 09/25/2020   Unilateral primary osteoarthritis, left knee 09/26/2016   Atrial flutter ([place]) 09/25/2013   Near syncope 09/25/2013    [facility] ,[place], [place]  03/20/2021, 3:21 PM  [facility] [address] [place], [place], [address] Phone: [phone]   Fax:  [phone]   Name: QUEST TAVENNER MRN: [id number] Date of Birth: Apr 21, 1956

## 2021-03-20 NOTE — Therapy (Signed)
OUTPATIENT PHYSICAL THERAPY Progress note/Recertification Note Patient Name: Walter [name] MRN: [id number] DOB:02-02-1956, 65 y.o., male Today's Date: 03/20/2021  PCP: Alroy Dust, L.Marlou Sa, MD REFERRING PROVIDER: Alroy Dust, Carlean Jews.Marlou Sa, MD   PT End of Session - 03/20/21 1320     Visit Number 24    Number of Visits 59    Date for PT Re-Evaluation 05/04/21    Authorization Type Medicare 12/26/20 to 02/20/21; Recert 1/50/56 to 97/94/80; 16P PN/recert on 10/31/72 to 82/7/07    Authorization Time Period 03/09/21-05/04/21    Progress Note Due on Visit 30    PT Start Time 1315    PT Stop Time 1400    PT Time Calculation (min) 45 min    Equipment Utilized During Treatment Gait belt    Activity Tolerance Patient tolerated treatment well    Behavior During Therapy Impulsive                  Past Medical History:  Diagnosis Date   Atrial flutter ([place])    s/p ablation   DVT (deep venous thrombosis) ([place])    Near syncope 09/25/2013   Paroxysmal atrial fibrillation ([place])    Stroke [facility])    initially hemorragic (not on [place]) followed by subsequent embolic stroke   Past Surgical History:  Procedure Laterality Date   A-FLUTTER ABLATION N/A 03/09/2020   Procedure: A-FLUTTER ABLATION;  Surgeon: Thompson Grayer, MD;  Location: [facility] CV LAB;  Service: Cardiovascular;  Laterality: N/A;   CARDIOVERSION N/A 09/27/2013   Procedure: CARDIOVERSION;  Surgeon: Lelon Perla, MD;  Location: [facility] ENDOSCOPY;  Service: Cardiovascular;  Laterality: N/A;   IR ANGIO EXTERNAL CAROTID SEL EXT CAROTID UNI R MOD SED  09/26/2020   IR ANGIO INTRA EXTRACRAN SEL COM CAROTID INNOMINATE BILAT MOD SED  09/26/2020   IR ANGIO VERTEBRAL SEL VERTEBRAL UNI R MOD SED  09/26/2020   IR IVC FILTER PLMT / S&I /IMG GUID/MOD SED  10/08/2020   IR IVUS EACH ADDITIONAL NON CORONARY VESSEL  11/02/2020   IR PTA VENOUS EXCEPT DIALYSIS CIRCUIT  11/02/2020   IR RADIOLOGIST EVAL & MGMT  01/17/2021   IR THROMBECT VENO MECH MOD SED  10/27/2020   IR  THROMBECT VENO MECH MOD SED  11/02/2020   IR US GUIDE VASC ACCESS LEFT  10/27/2020   IR US GUIDE VASC ACCESS RIGHT  09/26/2020   IR US GUIDE VASC ACCESS RIGHT  11/02/2020   IR VENO/EXT/UNI RIGHT  11/02/2020   IR VENOCAVAGRAM IVC  10/27/2020   RETINAL DETACHMENT SURGERY     right knee arthroscopy     TEE WITHOUT CARDIOVERSION N/A 09/27/2013   Procedure: TRANSESOPHAGEAL ECHOCARDIOGRAM (TEE);  Surgeon: Lelon Perla, MD;  Location: [facility] ENDOSCOPY;  Service: Cardiovascular;  Laterality: N/A;   WRIST SURGERY     Patient Active Problem List   Diagnosis Date Noted   Thrombosis    Sleep disturbance    Dysphagia, post-stroke    PAF (paroxysmal atrial fibrillation) ([place])    Acute pulmonary embolism without acute cor pulmonale (HCC)    Malnutrition of moderate degree 11/04/2020   Tracheostomy care ([place])    Pressure injury of skin 10/23/2020   Intracranial hemorrhage (HCC)    Atrial fibrillation ([place])    Prediabetes    Leukocytosis    Acute blood loss anemia    Hypernatremia    ICH (intracerebral hemorrhage) ([place]) 09/25/2020   Unilateral primary osteoarthritis, left knee 09/26/2016   Atrial flutter ([place]) 09/25/2013   Near syncope 09/25/2013  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  PERTINENT HISTORY: atrial flutter s/p ablation, DVT, near syncope, CVA  PRECAUTIONS: fall  SUBJECTIVE: Denies pain or falls, no med changes.  Was able to ride gator down driveway and back.  Objective:  Functional tests tracking: Berg Balance scale: eval 11/56, 21/56 (03/09/21) 10 meter walk test: 0.1m/s with RW (03/09/21) 5x sit to stand: 17 sec with bil UE support (03/09/21)  TODAY'S TREATMENT:   03/20/21 Seated trunk flexion/extension holding 5# ball 15X  Seated SB holding 5# ball 15x with PT stabilization dropping to each elbow  STS 15x with  5# ball SLR stabilization against blue t-band 60s duration x2 ea. LE DKTC over red physioball 15x   LTR 15x over red physioball  Scifit seat 20 arms 7 L2 x69min   03/14/21  Seated marching, alt, 6# 15x per LE with latissimus press while seated on green disk to challenge seated balance  Seated LAQs 6# alt. 15x per LE with latissimus press while seated on green disk to challenge seated balance   STS with OH punch using yellow t-band 10x  SLR circles against red band, 60s each leg in supine position  Ambulation of 218ft outside of clinic with RW with CGA to accommodate facility fire drill  Supine/sit transitions 5x to ea. Side with random stopping points  Quadriped on elbows WS forward and back 10x  03/12/21 Ambulation of 432ft with RW and CGA, cued to pause walking before pivot turning to focus on task at hand and break down tasks into individual  components to control ataxia Seated on green disk, alternating marching and LAQs with lat press to activate core, 3# ankle weights, alternating pattern, 15 reps per LE Seated on disk, forward reaching using RW 10x              Seated core using disk and 3.3# ball using hip/shoulder tosses, chops and Vs for 10 reps per task  STS with OH reach once standing, 10 reps 03/09/21 Gait training: standing up from standard chair with large based quad cane walking up around cone (10 feet away) and sitting back down in chair: 2x, cues for 3step pattern and sequencing, min A to mod A required 1 x 230 feet with RW, CGA to min A with cues for smaller steps BBS performed 5x sit to stand performed.   03/01/2021 Pt seen for aquatic therapy today.  Treatment took place in water 3.25-4.8 ft in depth at the [facility] pool. Temp of water was 92. entered/exited the pool via stairs step to pattern independently with bilat rail.  Introduced pt to aquatic setting.                           Amb using water walker. forward, backward and side stepping/walking cues for  increased step length, posture and weight shifting x 12 minutes. Pt requiring mod to min asst. Multiple LOB.  Seated -sit to stand transfers from water bench submerged to waist when standing.  Cueing for weight shift/execution. Initally completed with water walker, progressed to thick yellow noodle.  Pt requiring min-cga for completion. 2 x 5 Marching x 10 LAQ x 10.  Standing -step ups x leading with L/R LE.  Cueing for decreased impulsiveness /improved concentration on finishing first movement before moving to next. -toe raises 2 x 10  Cueing for balance/slowing movements  Supine suspension Flutter kicking x 5 min supported by Pt  Pt requires buoyancy for support and to offload joints with strengthening exercises. Viscosity of the water is needed for resistance of strengthening; water current perturbations provides challenge to standing balance unsupported, requiring increased core activation.   02/28/21: Gait training: 1 x 460 feet with tactile cues at pelvis to guide weight shifts cues for slower cadence and turning slowly Standing on floor: OH reaching and fwd reaching: alternating between reaches: 10x each Standing touching toes: 5x Gait training: chair to chair transfer with 180 deg turn: quad cane, CGA to min A: 4x  02/26/21: Gait training: 1 x 420 feet with tactile cues at pelvis to guide weight shifts cues for slower cadence and turning slowly Sit to stand: 10x no HHA Standing at countertop with wide BOS: moving basket with 20lbs from side to side to increase core engagement and weight shifts Picking up basket with 20lb from counter to chest: 5x Working on ankle strategy: standing heel and toe raises without HHA: very close guarding: 2 x 10  Standing contralateral reaching: 10x R and L    PATIENT EDUCATION: Education details: discussed reassessment findings Person educated: Patient and caregiver Education method: Explanation, Demonstration, and Verbal cues Education  comprehension: verbalized understanding     HOME EXERCISE PROGRAM: Continue daily walking with CG assist  ASSESSMENT:   CLINICAL IMPRESSION:  Todays session continued focus on seated balance and core exercises to challenge strength and mobility. Supine/sit transitions much more fluid indicating improved trunk strength and control.  Introduced red physioball tasks for flexibility, trunk/core control and strength.  Introduced stepper to add UE/LE dissociation tasking.  Movements are more controlled and deliberate.   REHAB POTENTIAL: Good   CLINICAL DECISION MAKING: Stable/uncomplicated  GOALS: Goals reviewed with patient? Yes   SHORT TERM GOALS:   STG Name Target Date Goal status  1 Patient will be able to perform chair to chair transfer with CGA/SBA with use of RW for safety to improve independence. Baseline: min to mod A (12/26/20); CGA/minA (03/09/21) 01/23/2021 02/22/21 Able to transfer STS and stand pivot with SBA/CGA  2 Pt will be able to ambulate 115' with RW with CGA to min A with RW for safety to improve independence Baseline: min to mod A with RW 145'; 230 feet with RW and CGA to MIN A 01/23/2021 03/09/21 Goal met  3 Pt will be able to maintain standing balance for 30 sec to improve static balance Baseline: 10 sec before needing help due to LOB (12/26/20) 01/23/2021 02/05/21 able to stand with S for 30s, goal met  4. Pt will be able to perform sit to stand transfers with SBA to improve independence 04/06/21 Revised 03/09/21  5. Pt will be able to perform 230 feet of walking with RW with SBA/CGA to improve household ambulation  04/06/21 Revised 03/09/21    LONG TERM GOALS:   LTG Name Target Date Goal status  1 Pt will demo >5 points improvement on Berg Balance Scale to improve static balance Baseline: 11/56 01/03/21 02/20/2021 INITIAL 03/09/21 BBS 21/56  2 Pt will be able to ambulate >500 feet with RW and SBA to improve walking endurance and safety. Baseline: 145' with RW min to mod A; 230  feet with CGA to Min A (03/09/21)  05/18/21 Progressing continue   3 Pt will be able to perform gait speed improvement by 0.30ms to improve community ambulation. Baseline:0.227m (03/09/21) 05/16/21 Progressing continue  4 Pt will  demo 5 sec improvement in 5x sit to stand (with or without UE support) to improve functional strength Baseline: 5x sit to stand with bil UE 17 sec;  05/18/21 Progressing continue    PLAN: PT FREQUENCY: 2-3x/week   PT DURATION: 8 weeks   PLANNED INTERVENTIONS: Aquatic therapy, Therapeutic exercises, Therapeutic activity, Neuro Muscular re-education, Balance training, Gait training, Patient/Family education, Joint mobilization, Stair training, Visual/preceptual remediation/compensation, Wheelchair mobility training, Cryotherapy, Moist heat, and Manual therapy   PLAN FOR NEXT SESSION:  Balance challenges progressed with seated core strengthening and tall kneel tasks as knees tolerate Standing/walking tasks in // bars, floor targets and stepping tasks, higher level gait training-stepping over and around obstacles, resisted walking, seated tasks on disk  Visit Diagnosis: No diagnosis found.     Problem List Patient Active Problem List   Diagnosis Date Noted   Thrombosis    Sleep disturbance    Dysphagia, post-stroke    PAF (paroxysmal atrial fibrillation) ([place])    Acute pulmonary embolism without acute cor pulmonale (HCC)    Malnutrition of moderate degree 11/04/2020   Tracheostomy care ([place])    Pressure injury of skin 10/23/2020   Intracranial hemorrhage (HCC)    Atrial fibrillation ([place])    Prediabetes    Leukocytosis    Acute blood loss anemia    Hypernatremia    ICH (intracerebral hemorrhage) ([place]) 09/25/2020   Unilateral primary osteoarthritis, left knee 09/26/2016   Atrial flutter ([place]) 09/25/2013   Near syncope 09/25/2013    Lanice Shirts, PT 03/20/2021, 1:22 PM  [facility] [address] [place], [place], [address] Phone: [phone]   Fax:  [phone]

## 2021-03-20 NOTE — Therapy (Signed)
[facility] [address] [place], [place], [address] Phone: [phone]   Fax:  [phone]  Occupational Therapy Treatment  Patient Details  Name: Walter [name] MRN: [id number] Date of Birth: 1956/03/22 Referring Provider (OT): Dr. Alysia Penna   Encounter Date: 03/20/2021   OT End of Session - 03/20/21 1346     Visit Number 22    Number of Visits 48    Date for OT Re-Evaluation 04/27/21    Authorization Type Medicare Part A & B- need to add [place]    Authorization - Visit Number 22    Progress Note Due on Visit 2    OT Start Time 7824    OT Stop Time 1315    OT Time Calculation (min) 40 min    Activity Tolerance Patient tolerated treatment well             Past Medical History:  Diagnosis Date   Atrial flutter ([place])    s/p ablation   DVT (deep venous thrombosis) ([place])    Near syncope 09/25/2013   Paroxysmal atrial fibrillation ([place])    Stroke [facility])    initially hemorragic (not on [place]) followed by subsequent embolic stroke    Past Surgical History:  Procedure Laterality Date   A-FLUTTER ABLATION N/A 03/09/2020   Procedure: A-FLUTTER ABLATION;  Surgeon: Thompson Grayer, MD;  Location: [facility];  Service: Cardiovascular;  Laterality: N/A;   CARDIOVERSION N/A 09/27/2013   Procedure: CARDIOVERSION;  Surgeon: Lelon Perla, MD;  Location: [facility] ENDOSCOPY;  Service: Cardiovascular;  Laterality: N/A;   IR ANGIO EXTERNAL CAROTID SEL EXT CAROTID UNI R MOD SED  09/26/2020   IR ANGIO INTRA EXTRACRAN SEL COM CAROTID INNOMINATE BILAT MOD SED  09/26/2020   IR ANGIO VERTEBRAL SEL VERTEBRAL UNI R MOD SED  09/26/2020   IR IVC FILTER PLMT / S&I /IMG GUID/MOD SED  10/08/2020   IR IVUS EACH ADDITIONAL NON CORONARY VESSEL  11/02/2020   IR PTA VENOUS EXCEPT DIALYSIS CIRCUIT  11/02/2020   IR RADIOLOGIST EVAL & MGMT  01/17/2021   IR THROMBECT VENO MECH MOD SED  10/27/2020   IR THROMBECT VENO MECH MOD SED  11/02/2020   IR US  GUIDE VASC ACCESS LEFT  10/27/2020   IR US GUIDE VASC ACCESS RIGHT  09/26/2020   IR US GUIDE VASC ACCESS RIGHT  11/02/2020   IR VENO/EXT/UNI RIGHT  11/02/2020   IR VENOCAVAGRAM IVC  10/27/2020   RETINAL DETACHMENT SURGERY     right knee arthroscopy     TEE WITHOUT CARDIOVERSION N/A 09/27/2013   Procedure: TRANSESOPHAGEAL ECHOCARDIOGRAM (TEE);  Surgeon: Lelon Perla, MD;  Location: [facility] ENDOSCOPY;  Service: Cardiovascular;  Laterality: N/A;   WRIST SURGERY      There were no vitals filed for this visit.   Subjective Assessment - 03/20/21 1346     Subjective  Denies pain today    Pertinent History ICH of brainstem (complicated by hydrocephalus, DVTs, and tracheostomy).  PMH:  a-fib    Limitations fall risk, ataxia, impulsivity    Patient Stated Goals use L side of body better (decrease ataxia), stay active and heal brain    Currently in Pain? No/denies                     Treatment: Prone on elbows lifting  chest followed by reaching with alternate UE, min v.c Tall kneeling rolling ball forwards and backwards, then prone over ball lifting alternate UE,  min facilitation/ v.c, close supervision for safety Seated closed chain shoulder flexion then trunk rotation holding ball between hands to alternate sides, min v.c Functional reaching with left and right UE's to place large pegs in pegboard on vertical surface then graded clothespins alternately, min difficulty/ drops. Pt's ataxia in LUE is improving.               OT Short Term Goals - 03/12/21 0859       OT SHORT TERM GOAL #1   Title Pt/caregiver will be independent with initial HEP for LUE coordination and R hand strength.--    Time 4    Period Weeks    Status Achieved   needs continued reinforcement, pt is not using consistently   Target Date 03/02/21      OT SHORT TERM GOAL #2   Title Pt/caregiver will be independent with vision HEP and visual compensation strategies.    Time 4    Period Weeks    Status  Achieved   Pt/ family verbalize understanding of vision HEP, needs continued reinforcement of compensatory strategeis- pt is not performing head turns     OT SHORT TERM GOAL #3   Title Pt will improve LUE coordination for ADLs as shown by improving score on box and blocks test by at least 6.    Baseline L-21 blocks    Time 4    Period Weeks    Status On-going   25 blocks.  03/12/21:  24 blocks     OT SHORT TERM GOAL #4   Title Pt will perform simple snack prep/home maintenance task from w/c level mod I.    Time 4    Period Weeks    Status Achieved   met per pt's wife     OT SHORT TERM GOAL #5   Title Pt will verbalize understanding of compensation strategies for sensory deficts for incr safety.    Time 4    Period Weeks    Status Achieved   Pt verbalized that he has to be careful about temperature, needs additional reinforcement.  03/12/21:  met     OT SHORT TERM GOAL #6   Title Pt will improve R hand grip stength to at least 32lbs to assist with ADLs/opening containers.    Time 4    Period Weeks    Status Achieved   33.5     OT SHORT TERM GOAL #7   Title --               OT Long Term Goals - 03/12/21 0904       OT LONG TERM GOAL #1   Title Pt will be independent with updated HEP--    Time 12    Period Weeks    Status On-going      OT LONG TERM GOAL #2   Title Pt will improve L hand coordination for ADLs as shown by completing 9-hole peg test in less than 70sec.    Baseline 107sec    Time 12    Period Weeks    Status On-going   03/12/21:  78sec, 78sec (with multiple drops)     OT LONG TERM GOAL #3   Title Pt will be mod I with toileting.    Time 12    Period Weeks    Status On-going   03/12/21:  supervision/CGA     OT LONG TERM GOAL #4   Title Pt will perform simple snack prep/home maintenance tasks in standing with  supervision.    Time 12    Period Weeks    Status On-going      OT LONG TERM GOAL #5   Title Pt will demo good safety awareness/compensation  for sensory deficits and attention to LUE during functional tasks and transfers without cueing.    Time 12    Period Weeks    Status On-going      OT LONG TERM GOAL #6   Title Pt will perform simple environmental scanning/navigation with at least 90% accuracy for incr safety.    Time 12    Period Weeks    Status On-going   03/12/21:  70% with CGA for balance/ambulation     OT LONG TERM GOAL #7   Title Pt will perform all basic ADLS with no more than supervision.    Time 12    Period Weeks    Status On-going   03/12/21:  min A-supervision                   Patient will benefit from skilled therapeutic intervention in order to improve the following deficits and impairments:           Visit Diagnosis: Muscle weakness (generalized)  Other lack of coordination  Other disturbances of skin sensation  Frontal lobe and executive function deficit  Unsteadiness on feet  Ataxia  Other symptoms and signs involving cognitive functions following other nontraumatic intracranial hemorrhage    Problem List Patient Active Problem List   Diagnosis Date Noted   Thrombosis    Sleep disturbance    Dysphagia, post-stroke    PAF (paroxysmal atrial fibrillation) ([facility])    Acute pulmonary embolism without acute cor pulmonale (H[facility])    Malnutrition of moderate degree 11/04/2020   Tracheostomy care ([place])    Pressure injury of skin 10/23/2020   Intracranial hemorrhage ([facility])    Atrial fibrillation ([place])    Prediabetes    Leukocytosis    Acute blood loss anemia    Hypernatremia    ICH (intracerebral hemorrhage) ([place]) 09/25/2020   Unilateral primary osteoarthritis, left knee 09/26/2016   Atrial flutter ([place]) 09/25/2013   Near syncope 09/25/2013    Danelia Snodgrass, OT/L 03/20/2021, 1:50 PM  [facility] [address] [place], [place], [address] Phone: [phone]   Fax:  [phone]  Name: THEOREN PALKA MRN:  [id number] Date of Birth: Apr 12, 1956

## 2021-03-22 ENCOUNTER — Ambulatory Visit: Payer: Medicare Other

## 2021-03-22 ENCOUNTER — Ambulatory Visit: Payer: Medicare Other | Admitting: Occupational Therapy

## 2021-03-22 ENCOUNTER — Encounter: Payer: Self-pay | Admitting: Occupational Therapy

## 2021-03-22 ENCOUNTER — Other Ambulatory Visit: Payer: Self-pay

## 2021-03-22 DIAGNOSIS — R471 Dysarthria and anarthria: Secondary | ICD-10-CM

## 2021-03-22 DIAGNOSIS — R2681 Unsteadiness on feet: Secondary | ICD-10-CM | POA: Diagnosis not present

## 2021-03-22 DIAGNOSIS — R208 Other disturbances of skin sensation: Secondary | ICD-10-CM

## 2021-03-22 DIAGNOSIS — I69218 Other symptoms and signs involving cognitive functions following other nontraumatic intracranial hemorrhage: Secondary | ICD-10-CM

## 2021-03-22 DIAGNOSIS — M6281 Muscle weakness (generalized): Secondary | ICD-10-CM

## 2021-03-22 DIAGNOSIS — R278 Other lack of coordination: Secondary | ICD-10-CM

## 2021-03-22 DIAGNOSIS — R41841 Cognitive communication deficit: Secondary | ICD-10-CM

## 2021-03-22 DIAGNOSIS — R41842 Visuospatial deficit: Secondary | ICD-10-CM

## 2021-03-22 DIAGNOSIS — R270 Ataxia, unspecified: Secondary | ICD-10-CM

## 2021-03-22 DIAGNOSIS — R41844 Frontal lobe and executive function deficit: Secondary | ICD-10-CM

## 2021-03-22 NOTE — Therapy (Signed)
[facility] [address] [place], [place], [address] Phone: [phone]   Fax:  [phone]  Occupational Therapy Treatment  Patient Details  Name: Walter [name] MRN: [id number] Date of Birth: 09/14/1955 Referring Provider (OT): Dr. Alysia Penna   Encounter Date: 03/22/2021   OT End of Session - 03/22/21 1436     Visit Number 23    Number of Visits 55    Date for OT Re-Evaluation 04/27/21    Authorization Type Medicare Part A & B- need to add [place] - Visit Number 23    Authorization - Number of Visits 30    Progress Note Due on Visit 67    OT Start Time 1320    OT Stop Time 1400    OT Time Calculation (min) 40 min    Activity Tolerance Patient tolerated treatment well    Behavior During Therapy Impulsive             Past Medical History:  Diagnosis Date   Atrial flutter ([place])    s/p ablation   DVT (deep venous thrombosis) ([place])    Near syncope 09/25/2013   Paroxysmal atrial fibrillation ([place])    Stroke ([place])    initially hemorragic (not on [place]) followed by subsequent embolic stroke    Past Surgical History:  Procedure Laterality Date   A-FLUTTER ABLATION N/A 03/09/2020   Procedure: A-FLUTTER ABLATION;  Surgeon: Thompson Grayer, MD;  Location: [facility];  Service: Cardiovascular;  Laterality: N/A;   CARDIOVERSION N/A 09/27/2013   Procedure: CARDIOVERSION;  Surgeon: Lelon Perla, MD;  Location: [facility] ENDOSCOPY;  Service: Cardiovascular;  Laterality: N/A;   IR ANGIO EXTERNAL CAROTID SEL EXT CAROTID UNI R MOD SED  09/26/2020   IR ANGIO INTRA EXTRACRAN SEL COM CAROTID INNOMINATE BILAT MOD SED  09/26/2020   IR ANGIO VERTEBRAL SEL VERTEBRAL UNI R MOD SED  09/26/2020   IR IVC FILTER PLMT / S&I /IMG GUID/MOD SED  10/08/2020   IR IVUS EACH ADDITIONAL NON CORONARY VESSEL  11/02/2020   IR PTA VENOUS EXCEPT DIALYSIS CIRCUIT  11/02/2020   IR RADIOLOGIST EVAL & MGMT  01/17/2021   IR THROMBECT VENO  MECH MOD SED  10/27/2020   IR THROMBECT VENO MECH MOD SED  11/02/2020   IR US GUIDE VASC ACCESS LEFT  10/27/2020   IR US GUIDE VASC ACCESS RIGHT  09/26/2020   IR US GUIDE VASC ACCESS RIGHT  11/02/2020   IR VENO/EXT/UNI RIGHT  11/02/2020   IR VENOCAVAGRAM IVC  10/27/2020   RETINAL DETACHMENT SURGERY     right knee arthroscopy     TEE WITHOUT CARDIOVERSION N/A 09/27/2013   Procedure: TRANSESOPHAGEAL ECHOCARDIOGRAM (TEE);  Surgeon: Lelon Perla, MD;  Location: [facility] ENDOSCOPY;  Service: Cardiovascular;  Laterality: N/A;   WRIST SURGERY      There were no vitals filed for this visit.   Subjective Assessment - 03/22/21 1319     Subjective  "I'm doing better"  Pt is very encouraged by progress.  "I'm not really using w/c anymore and I spoke at a group for 20 people.  My speech is improved"    Patient is accompanied by: --   aide/caregiver   Pertinent History ICH of brainstem (complicated by hydrocephalus, DVTs, and tracheostomy).  PMH:  a-fib    Limitations fall risk, ataxia, impulsivity    Patient Stated Goals use L side of body better (decrease ataxia), stay active and heal brain  Currently in Pain? No/denies             Ambulating with RW with CGA for balance and speed.  Gathering needed items and making a peanut and butter and jelly sandwich.  Pt needed min A/mod cueing for balance, walker placement, cueing to get closer to counter/objects prior to reaching.  Pt incorporated LUE in bilateral task, but was noted to leave L hand on walker too far away instead of bringing it to counter to assist with balance.  Pt able to open jars, bread, close bread, and spread peanutbutter and jelly, but demo incr difficulty cutting sandwich.  Discussed difficulties and recommendations for incr ease and safety.  Pt/caregiver verbalized understanding.     OT Education - 03/22/21 1415     Education Details Use of walker tray and where to purchase; RW placement (getting closer to counter) with reaching and  encouraged to practice with supervision at home.    Person(s) Educated Patient;Caregiver(s)    Methods Explanation;Demonstration;Verbal cues;Tactile cues    Comprehension Verbalized understanding;Returned demonstration;Verbal cues required              OT Short Term Goals - 03/12/21 0859       OT SHORT TERM GOAL #1   Title Pt/caregiver will be independent with initial HEP for LUE coordination and R hand strength.--    Time 4    Period Weeks    Status Achieved   needs continued reinforcement, pt is not using consistently   Target Date 03/02/21      OT SHORT TERM GOAL #2   Title Pt/caregiver will be independent with vision HEP and visual compensation strategies.    Time 4    Period Weeks    Status Achieved   Pt/ family verbalize understanding of vision HEP, needs continued reinforcement of compensatory strategeis- pt is not performing head turns     OT SHORT TERM GOAL #3   Title Pt will improve LUE coordination for ADLs as shown by improving score on box and blocks test by at least 6.    Baseline L-21 blocks    Time 4    Period Weeks    Status On-going   25 blocks.  03/12/21:  24 blocks     OT SHORT TERM GOAL #4   Title Pt will perform simple snack prep/home maintenance task from w/c level mod I.    Time 4    Period Weeks    Status Achieved   met per pt's wife     OT SHORT TERM GOAL #5   Title Pt will verbalize understanding of compensation strategies for sensory deficts for incr safety.    Time 4    Period Weeks    Status Achieved   Pt verbalized that he has to be careful about temperature, needs additional reinforcement.  03/12/21:  met     OT SHORT TERM GOAL #6   Title Pt will improve R hand grip stength to at least 32lbs to assist with ADLs/opening containers.    Time 4    Period Weeks    Status Achieved   33.5     OT SHORT TERM GOAL #7   Title --               OT Long Term Goals - 03/12/21 0904       OT LONG TERM GOAL #1   Title Pt will be  independent with updated HEP--    Time 12    Period Weeks  Status On-going      OT LONG TERM GOAL #2   Title Pt will improve L hand coordination for ADLs as shown by completing 9-hole peg test in less than 70sec.    Baseline 107sec    Time 12    Period Weeks    Status On-going   03/12/21:  78sec, 78sec (with multiple drops)     OT LONG TERM GOAL #3   Title Pt will be mod I with toileting.    Time 12    Period Weeks    Status On-going   03/12/21:  supervision/CGA     OT LONG TERM GOAL #4   Title Pt will perform simple snack prep/home maintenance tasks in standing with supervision.    Time 12    Period Weeks    Status On-going      OT LONG TERM GOAL #5   Title Pt will demo good safety awareness/compensation for sensory deficits and attention to LUE during functional tasks and transfers without cueing.    Time 12    Period Weeks    Status On-going      OT LONG TERM GOAL #6   Title Pt will perform simple environmental scanning/navigation with at least 90% accuracy for incr safety.    Time 12    Period Weeks    Status On-going   03/12/21:  70% with CGA for balance/ambulation     OT LONG TERM GOAL #7   Title Pt will perform all basic ADLS with no more than supervision.    Time 12    Period Weeks    Status On-going   03/12/21:  min A-supervision                  Plan - 03/22/21 1320     Clinical Impression Statement Pt is progressing towards goals with improved coordination and functional mobility; however, pt needs mod cueing for safety and RW negotiation/placement.  Pt is encouraged with progress.    OT Occupational Profile and History Detailed Assessment- Review of Records and additional review of physical, cognitive, psychosocial history related to current functional performance    Occupational performance deficits (Please refer to evaluation for details): ADL's;IADL's;Work;Leisure;Social Participation    Body Structure / Function / Physical Skills  ADL;Strength;Balance;Proprioception;UE functional use;IADL;Endurance;Vision;Mobility;Coordination;Decreased knowledge of precautions;FMC;GMC;Sensation    Cognitive Skills Attention;Safety Awareness;Memory;Perception;Problem Solve    Rehab Potential Good    Clinical Decision Making Several treatment options, min-mod task modification necessary    Comorbidities Affecting Occupational Performance: May have comorbidities impacting occupational performance    Modification or Assistance to Complete Evaluation  Min-Moderate modification of tasks or assist with assess necessary to complete eval    OT Frequency 3x / week   updated frequency may decrease after 4 weeks   OT Duration 12 weeks   written for 12 weeks however may d/c after 8 depenent upon progress.   OT Treatment/Interventions Self-care/ADL training;Moist Heat;Fluidtherapy;DME and/or AE instruction;Balance training;Therapeutic activities;Aquatic Therapy;Ultrasound;Therapeutic exercise;Cognitive remediation/compensation;Visual/perceptual remediation/compensation;Functional Mobility Training;Neuromuscular education;Cryotherapy;Energy conservation;Manual Therapy;Patient/family education    Plan continue with UE control, standing balance/functional mobility functional reaching, RW placement with functional reach    Consulted and Agree with Plan of Care Family member/caregiver;Patient    Family Member Consulted wife             Patient will benefit from skilled therapeutic intervention in order to improve the following deficits and impairments:   Body Structure / Function / Physical Skills: ADL, Strength, Balance, Proprioception, UE functional use, IADL, Endurance,  Vision, Mobility, Coordination, Decreased knowledge of precautions, [place], [place], Sensation Cognitive Skills: Attention, Safety Awareness, Memory, Perception, Problem Solve     Visit Diagnosis: Ataxia  Other lack of coordination  Muscle weakness (generalized)  Other disturbances  of skin sensation  Frontal lobe and executive function deficit  Unsteadiness on feet  Other symptoms and signs involving cognitive functions following other nontraumatic intracranial hemorrhage  Visuospatial deficit    Problem List Patient Active Problem List   Diagnosis Date Noted   Thrombosis    Sleep disturbance    Dysphagia, post-stroke    PAF (paroxysmal atrial fibrillation) ([place])    Acute pulmonary embolism without acute cor pulmonale (HCC)    Malnutrition of moderate degree 11/04/2020   Tracheostomy care ([place])    Pressure injury of skin 10/23/2020   Intracranial hemorrhage (H[facility])    Atrial fibrillation ([place])    Prediabetes    Leukocytosis    Acute blood loss anemia    Hypernatremia    ICH (intracerebral hemorrhage) ([place]) 09/25/2020   Unilateral primary osteoarthritis, left knee 09/26/2016   Atrial flutter ([place]) 09/25/2013   Near syncope 09/25/2013    [facility], OT/L 03/22/2021, 2:38 PM  [facility] [address] [place], [place], [address] Phone: [phone]   Fax:  [phone]  Name: Walter [name] MRN: [id number] Date of Birth: 1956-05-09  Vianne Bulls, OTR/L [facility] [address] [place], [place]  [address] [phone] phone [phone] 03/22/21 2:38 PM

## 2021-03-22 NOTE — Therapy (Signed)
OUTPATIENT PHYSICAL THERAPY TREATMENT NOTE Patient Name: Walter [name] MRN: [id number] DOB:Nov 05, 1955, 65 y.o., male Today's Date: 03/22/2021  PCP: Alroy Dust, L.Marlou Sa, MD REFERRING PROVIDER: Alroy Dust, Carlean Jews.Marlou Sa, MD   PT End of Session - 03/22/21 1330     Visit Number 25    Number of Visits 44    Date for PT Re-Evaluation 05/04/21    Authorization Type Medicare 12/26/20 to 02/20/21; Recert 1/60/10 to 93/23/55; 73U PN/recert on 2/0/25 to 42/7/06    Authorization Time Period 03/09/21-05/04/21    Progress Note Due on Visit 30    PT Start Time 1231    PT Stop Time 1313    PT Time Calculation (min) 42 min    Equipment Utilized During Treatment Gait belt    Activity Tolerance Patient tolerated treatment well    Behavior During Therapy Impulsive                   Past Medical History:  Diagnosis Date   Atrial flutter ([place])    s/p ablation   DVT (deep venous thrombosis) ([place])    Near syncope 09/25/2013   Paroxysmal atrial fibrillation ([place])    Stroke [facility])    initially hemorragic (not on [place]) followed by subsequent embolic stroke   Past Surgical History:  Procedure Laterality Date   A-FLUTTER ABLATION N/A 03/09/2020   Procedure: A-FLUTTER ABLATION;  Surgeon: Thompson Grayer, MD;  Location: [place] [facility] LAB;  Service: Cardiovascular;  Laterality: N/A;   CARDIOVERSION N/A 09/27/2013   Procedure: CARDIOVERSION;  Surgeon: Lelon Perla, MD;  Location: [facility] ENDOSCOPY;  Service: Cardiovascular;  Laterality: N/A;   IR ANGIO EXTERNAL CAROTID SEL EXT CAROTID UNI R MOD SED  09/26/2020   IR ANGIO INTRA EXTRACRAN SEL COM CAROTID INNOMINATE BILAT MOD SED  09/26/2020   IR ANGIO VERTEBRAL SEL VERTEBRAL UNI R MOD SED  09/26/2020   IR IVC FILTER PLMT / S&I /IMG GUID/MOD SED  10/08/2020   IR IVUS EACH ADDITIONAL NON CORONARY VESSEL  11/02/2020   IR PTA VENOUS EXCEPT DIALYSIS CIRCUIT  11/02/2020   IR RADIOLOGIST EVAL & MGMT  01/17/2021   IR THROMBECT VENO MECH MOD SED  10/27/2020   IR THROMBECT VENO  MECH MOD SED  11/02/2020   IR US GUIDE VASC ACCESS LEFT  10/27/2020   IR US GUIDE VASC ACCESS RIGHT  09/26/2020   IR US GUIDE VASC ACCESS RIGHT  11/02/2020   IR VENO/EXT/UNI RIGHT  11/02/2020   IR VENOCAVAGRAM IVC  10/27/2020   RETINAL DETACHMENT SURGERY     right knee arthroscopy     TEE WITHOUT CARDIOVERSION N/A 09/27/2013   Procedure: TRANSESOPHAGEAL ECHOCARDIOGRAM (TEE);  Surgeon: Lelon Perla, MD;  Location: [facility] ENDOSCOPY;  Service: Cardiovascular;  Laterality: N/A;   WRIST SURGERY     Patient Active Problem List   Diagnosis Date Noted   Thrombosis    Sleep disturbance    Dysphagia, post-stroke    PAF (paroxysmal atrial fibrillation) ([place])    Acute pulmonary embolism without acute cor pulmonale (HCC)    Malnutrition of moderate degree 11/04/2020   Tracheostomy care ([place])    Pressure injury of skin 10/23/2020   Intracranial hemorrhage (HCC)    Atrial fibrillation ([place])    Prediabetes    Leukocytosis    Acute blood loss anemia    Hypernatremia    ICH (intracerebral hemorrhage) ([place]) 09/25/2020   Unilateral primary osteoarthritis, left knee 09/26/2016   Atrial flutter ([place]) 09/25/2013   Near syncope 09/25/2013  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 No pain. No falls.   TODAY'S TREATMENT:    03/22/21  Completed gait training x 230 ft with RW, PT facilitating at pelvis for improved weight shift and cues for controlled stepping to promote stability. Then progressed to gait training with negotiation around obstacles (cones) x 3 laps, cues to stay aligned within walker, most challenge noted with sharp turns. CGA  throughout. Continued cues for improved turn and alignment with surface prior to descent with sit <> stand.   Seated on green air disc without UE support, completed lateral weight shift to R/L x 15 reps working on control and cues for posture/core activation. Then progressed to weight shift with reaching for cone in various directions x 10 reps.   Tall Kneeling on Mat with BUE support on Bench positioned in front of patient. CGA to get into/out of position. In tall kneeling completed hip hinges 2 x 10 reps working on improved return to upright and maintian hip extension, cues to keep posture upright. Then completed alternating UE raises x 10 reps bilaterally. Lateral reaching to PT's hand x 5 reps bilateral directions. Then trialed no UE support, difficulty maintaining tall kneeling position without assistance.    03/20/21 Seated trunk flexion/extension holding 5# ball 15X  Seated SB holding 5# ball 15x with PT stabilization dropping to each elbow  STS 15x with 5# ball SLR stabilization against blue t-band 60s duration x2 ea. LE DKTC over red physioball 15x   LTR 15x over red physioball  Scifit seat 20 arms 7 L2 x69mn   03/14/21  Seated marching, alt, 6# 15x per LE with latissimus press while seated on green disk to challenge seated balance  Seated LAQs 6# alt. 15x per LE with latissimus press while seated on green disk to challenge seated balance   STS with OH punch using yellow t-band 10x  SLR circles against red band, 60s each leg in supine position  Ambulation of 2027foutside of clinic with RW with CGA to accommodate facility fire drill  Supine/sit transitions 5x to ea. Side with random stopping points  Quadriped on elbows WS forward and back 10x  03/12/21 Ambulation of 46057fith RW and CGA, cued to pause walking before pivot turning to focus on task at hand and break down tasks into individual  components to control ataxia Seated on green disk, alternating marching and LAQs with lat press  to activate core, 3# ankle weights, alternating pattern, 15 reps per LE Seated on disk, forward reaching using RW 10x              Seated core using disk and 3.3# ball using hip/shoulder tosses, chops and Vs for 10 reps per task  STS with OH reach once standing, 10 reps 03/09/21 Gait training: standing up from standard chair with large based quad cane walking up around cone (10 feet away) and sitting back down in chair: 2x, cues for 3step pattern and sequencing, min A to mod A required 1 x 230 feet with RW, CGA to min A with cues for smaller steps BBS performed 5x sit to stand performed.   03/01/2021 Pt seen for aquatic therapy today.  Treatment took place in  water 3.25-4.8 ft in depth at the [employer] pool. Temp of water was 92. entered/exited the pool via stairs step to pattern independently with bilat rail.  Introduced pt to aquatic setting.                           Amb using water walker. forward, backward and side stepping/walking cues for increased step length, posture and weight shifting x 12 minutes. Pt requiring mod to min asst. Multiple LOB.  Seated -sit to stand transfers from water bench submerged to waist when standing.  Cueing for weight shift/execution. Initally completed with water walker, progressed to thick yellow noodle.  Pt requiring min-cga for completion. 2 x 5 Marching x 10 LAQ x 10.  Standing -step ups x leading with L/R LE.  Cueing for decreased impulsiveness /improved concentration on finishing first movement before moving to next. -toe raises 2 x 10 Cueing for balance/slowing movements  Supine suspension Flutter kicking x 5 min supported by Pt  Pt requires buoyancy for support and to offload joints with strengthening exercises. Viscosity of the water is needed for resistance of strengthening; water current perturbations provides challenge to standing balance unsupported, requiring increased core activation.   02/28/21: Gait training: 1 x 460 feet  with tactile cues at pelvis to guide weight shifts cues for slower cadence and turning slowly Standing on floor: OH reaching and fwd reaching: alternating between reaches: 10x each Standing touching toes: 5x Gait training: chair to chair transfer with 180 deg turn: quad cane, CGA to min A: 4x  02/26/21: Gait training: 1 x 420 feet with tactile cues at pelvis to guide weight shifts cues for slower cadence and turning slowly Sit to stand: 10x no HHA Standing at countertop with wide BOS: moving basket with 20lbs from side to side to increase core engagement and weight shifts Picking up basket with 20lb from counter to chest: 5x Working on ankle strategy: standing heel and toe raises without HHA: very close guarding: 2 x 10  Standing contralateral reaching: 10x R and L    PATIENT EDUCATION: Education details: Scheduling more PT visits for October Person educated: Patient and caregiver Education method: Consulting civil engineer, Media planner, and Verbal cues Education comprehension: verbalized understanding     HOME EXERCISE PROGRAM: Continue daily walking with CG assist  ASSESSMENT:   CLINICAL IMPRESSION:  Todays session focused on gait training with RW, and working toward negotiation around obstacles as most challenge noted with turns. Then continued balance seated on complaint surface and in tall kneeling position. Cues required for upright posture and core activation to promote core stability. Will continue to progress toward LTGs.   REHAB POTENTIAL: Good   CLINICAL DECISION MAKING: Stable/uncomplicated  GOALS: Goals reviewed with patient? Yes   SHORT TERM GOALS:   STG Name Target Date Goal status  1 Patient will be able to perform chair to chair transfer with CGA/SBA with use of RW for safety to improve independence. Baseline: min to mod A (12/26/20); CGA/minA (03/09/21) 01/23/2021 02/22/21 Able to transfer STS and stand pivot with SBA/CGA  2 Pt will be able to ambulate 115' with RW with CGA  to min A with RW for safety to improve independence Baseline: min to mod A with RW 145'; 230 feet with RW and CGA to MIN A 01/23/2021 03/09/21 Goal met  3 Pt will be able to maintain standing balance for 30 sec to improve static balance Baseline: 10 sec before needing help due to  LOB (12/26/20) 01/23/2021 02/05/21 able to stand with S for 30s, goal met  4. Pt will be able to perform sit to stand transfers with SBA to improve independence 04/06/21 Revised 03/09/21  5. Pt will be able to perform 230 feet of walking with RW with SBA/CGA to improve household ambulation  04/06/21 Revised 03/09/21    LONG TERM GOALS:   LTG Name Target Date Goal status  1 Pt will demo >5 points improvement on Berg Balance Scale to improve static balance Baseline: 11/56 01/03/21 02/20/2021 INITIAL 03/09/21 BBS 21/56  2 Pt will be able to ambulate >500 feet with RW and SBA to improve walking endurance and safety. Baseline: 145' with RW min to mod A; 230 feet with CGA to Min A (03/09/21)  05/18/21 Progressing continue   3 Pt will be able to perform gait speed improvement by 0.21ms to improve community ambulation. Baseline:0.241m (03/09/21) 05/16/21 Progressing continue  4 Pt will demo 5 sec improvement in 5x sit to stand (with or without UE support) to improve functional strength Baseline: 5x sit to stand with bil UE 17 sec;  05/18/21 Progressing continue    PLAN: PT FREQUENCY: 2-3x/week   PT DURATION: 8 weeks   PLANNED INTERVENTIONS: Aquatic therapy, Therapeutic exercises, Therapeutic activity, Neuro Muscular re-education, Balance training, Gait training, Patient/Family education, Joint mobilization, Stair training, Visual/preceptual remediation/compensation, Wheelchair mobility training, Cryotherapy, Moist heat, and Manual therapy   PLAN FOR NEXT SESSION:  Balance challenges progressed with seated core strengthening and tall kneel tasks as knees tolerate Standing/walking tasks in // bars, floor targets and stepping tasks, higher  level gait training-stepping over and around obstacles, resisted walking, seated tasks on disk  Visit Diagnosis: Unsteadiness on feet  Muscle weakness (generalized)  Other lack of coordination     Problem List Patient Active Problem List   Diagnosis Date Noted   Thrombosis    Sleep disturbance    Dysphagia, post-stroke    PAF (paroxysmal atrial fibrillation) ([place]   Acute pulmonary embolism without acute cor pulmonale ([facility]   Malnutrition of moderate degree 11/04/2020   Tracheostomy care ([facility]   Pressure injury of skin 10/23/2020   Intracranial hemorrhage (HCC)    Atrial fibrillation ([facility]   Prediabetes    Leukocytosis    Acute blood loss anemia    Hypernatremia    ICH (intracerebral hemorrhage) ([facility]03/28/2022   Unilateral primary osteoarthritis, left knee 09/26/2016   Atrial flutter ([facility]03/28/2015   Near syncope 09/25/2013    KaJones BalesPT, DPT 03/22/2021, 1:41 PM  [address]one: [phone] Fax:  [phone]

## 2021-03-22 NOTE — Therapy (Signed)
[facility] [address] [place], [place], [address] Phone: [phone]   Fax:  [phone]  Speech Language Pathology Treatment  Patient Details  Name: Walter [name] MRN: [id number] Date of Birth: 06-28-1956 Referring Provider (SLP): Charlett Blake, MD   Encounter Date: 03/22/2021   End of Session - 03/22/21 1425     Visit Number 24    Number of Visits 40    Date for SLP Re-Evaluation 04/20/21    Authorization Type Medicare    SLP Start Time 1146    SLP Stop Time  1230    SLP Time Calculation (min) 44 min    Activity Tolerance Patient tolerated treatment well             Past Medical History:  Diagnosis Date   Atrial flutter ([place])    s/p ablation   DVT (deep venous thrombosis) ([place])    Near syncope 09/25/2013   Paroxysmal atrial fibrillation ([place])    Stroke [facility])    initially hemorragic (not on [facility]) followed by subsequent embolic stroke    Past Surgical History:  Procedure Laterality Date   A-FLUTTER ABLATION N/A 03/09/2020   Procedure: A-FLUTTER ABLATION;  Surgeon: Thompson Grayer, MD;  Location: [place] CV LAB;  Service: Cardiovascular;  Laterality: N/A;   CARDIOVERSION N/A 09/27/2013   Procedure: CARDIOVERSION;  Surgeon: Lelon Perla, MD;  Location: [facility] ENDOSCOPY;  Service: Cardiovascular;  Laterality: N/A;   IR ANGIO EXTERNAL CAROTID SEL EXT CAROTID UNI R MOD SED  09/26/2020   IR ANGIO INTRA EXTRACRAN SEL COM CAROTID INNOMINATE BILAT MOD SED  09/26/2020   IR ANGIO VERTEBRAL SEL VERTEBRAL UNI R MOD SED  09/26/2020   IR IVC FILTER PLMT / S&I /IMG GUID/MOD SED  10/08/2020   IR IVUS EACH ADDITIONAL NON CORONARY VESSEL  11/02/2020   IR PTA VENOUS EXCEPT DIALYSIS CIRCUIT  11/02/2020   IR RADIOLOGIST EVAL & MGMT  01/17/2021   IR THROMBECT VENO MECH MOD SED  10/27/2020   IR THROMBECT VENO MECH MOD SED  11/02/2020   IR US GUIDE VASC ACCESS LEFT  10/27/2020   IR US GUIDE VASC ACCESS RIGHT  09/26/2020   IR US  GUIDE VASC ACCESS RIGHT  11/02/2020   IR VENO/EXT/UNI RIGHT  11/02/2020   IR VENOCAVAGRAM IVC  10/27/2020   RETINAL DETACHMENT SURGERY     right knee arthroscopy     TEE WITHOUT CARDIOVERSION N/A 09/27/2013   Procedure: TRANSESOPHAGEAL ECHOCARDIOGRAM (TEE);  Surgeon: Lelon Perla, MD;  Location: [facility] ENDOSCOPY;  Service: Cardiovascular;  Laterality: N/A;   WRIST SURGERY      There were no vitals filed for this visit.   Subjective Assessment - 03/22/21 1154     Subjective "I have been practicing"    Currently in Pain? Yes    Pain Score 1     Pain Location Back                   ADULT SLP TREATMENT - 03/22/21 1151       General Information   Behavior/Cognition Alert;Cooperative;Pleasant mood;Requires cueing      Treatment Provided   Treatment provided Cognitive-Linquistic      Cognitive-Linquistic Treatment   Treatment focused on Dysarthria;Cognition    Skilled Treatment Pt presents with WNL volume in conversation this session, with pt able to maintain adequate conversational volume with rare verbal and visual cues. Pt rated ~95% intelligible in conversation. 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 Pt is continuing to practice reading aloud complex sentences at home ~1x/day. SLP recommended HEP twice a day, but he endorsed he is sometimes too busy. If patient continues to carryover speech compensations at conversational level, ST sessions may need to shift focus to cognitive lingusitic functioning.      Assessment / Recommendations / Plan   Plan Continue with current plan of care      Progression Toward Goals   Progression toward goals Progressing toward goals              SLP Education - 03/22/21 1425     Education Details monitoring for carryover in longer conversations and late at night when fatigued    Person(s) Educated Patient    Methods Explanation;Demonstration    Comprehension Verbalized  understanding;Returned demonstration;Need further instruction              SLP Short Term Goals - 02/21/21 1552       SLP SHORT TERM GOAL #1   Title Pt will complete dysarthria HEP with occasional min A over 2 sessions    Status Not Met      SLP SHORT TERM GOAL #2   Title Pt will produce loud /a/ or "hey!" with at least high 80s dB average over 2 sessions    Status Not Met      SLP SHORT TERM GOAL #3   Title Pt will generate abdominal breathing in 16/20 answers to SLP questions over 2 sessions    Baseline 01-18-21    Status Partially Met      SLP SHORT TERM GOAL #4   Title Pt will demonstrate dysarthria compensations in 10 minute simple to mod complex conversation to be 75% intelligibile with occasional min A over 2 sessions    Status Not Met      SLP SHORT TERM GOAL #5   Title Pt will complete formal cognitive linguistic assessment in first 1-2 ST sessions    Status Achieved      SLP SHORT TERM GOAL #6   Title Pt will use memory and attention compensations for appointments, medicine management, and other daily activities with occasional min A over 2 sessions    Status Not Met              SLP Long Term Goals - 03/22/21 1427       SLP LONG TERM GOAL #1   Title Pt will complete dysarthria HEP with rare min A over 2 sessions    Baseline 03-20-21, 03-22-21    Status Achieved      SLP LONG TERM GOAL #2   Title Pt will demonstrate dysarthria compensations in 10 minute simple conversation to be 90% intelligibile with rare min A over 3 sessions    Baseline 03-22-21    Time 8    Period Weeks    Status On-going   achieved, and revised for 10 minutes simple to mod complex conversation over 3 sessions   Target Date 04/20/21      SLP LONG TERM GOAL #3   Title Pt will demo improved sustained and selective attention during therapeutic tasks with less than 3 cues for active listening during 2 sessions    Time 1    Period Weeks    Status Deferred      SLP LONG TERM GOAL #4    Title Pt will demonstrate emergent and anticipatory awareness of impulsivity with less than 5 cues to modify behaviors during  2 sessions    Status On-going   and ongoing   Target Date 04/20/21      SLP LONG TERM GOAL #5   Title Pt will report reduced frustration and improved communication effectiveness via PROM by 2 points at last ST sessions    Baseline CES: 15.5 & V-RQOL:35    Time 8    Period Weeks    Status On-going    Target Date 04/20/21      SLP LONG TERM GOAL #6   Title Pt will selectively attend to cognitive linguistic task for 5 minutes with 3 or less cues for redirection over 3 sessions    Time 8    Period Weeks    Status On-going    Target Date 04/20/21              Plan - 03/22/21 1425     Clinical Impression Statement "[name]" presents for OPST intervention secondary to stroke in March 2022. Pt worked with SLP continuing in training pt in breath support and compensations for audible, intelligible speech in converesation. He continues to demonstrate reduced selective attention, processing and problem solving, although pt believes cognition has improved. Skilled ST is cont'd warranted to address dysarthria and cognition to maximize communication effectiveness and optimize functional independence and safety at home.    Speech Therapy Frequency 2x / week    Duration --   16 weeks   Treatment/Interventions Cueing hierarchy;Functional tasks;Patient/family education;Cognitive reorganization;Multimodal communcation approach;Language facilitation;Compensatory techniques;Internal/external aids;SLP instruction and feedback    Potential to Achieve Goals Fair    Potential Considerations Ability to learn/carryover information;Cooperation/participation level    SLP Home Exercise Plan provided    Consulted and Agree with Plan of Care Patient             Patient will benefit from skilled therapeutic intervention in order to improve the following deficits and impairments:    Dysarthria and anarthria  Cognitive communication deficit    Problem List Patient Active Problem List   Diagnosis Date Noted   Thrombosis    Sleep disturbance    Dysphagia, post-stroke    PAF (paroxysmal atrial fibrillation) ([place])    Acute pulmonary embolism without acute cor pulmonale (HCC)    Malnutrition of moderate degree 11/04/2020   Tracheostomy care ([place])    Pressure injury of skin 10/23/2020   Intracranial hemorrhage (HCC)    Atrial fibrillation ([place])    Prediabetes    Leukocytosis    Acute blood loss anemia    Hypernatremia    ICH (intracerebral hemorrhage) ([place]) 09/25/2020   Unilateral primary osteoarthritis, left knee 09/26/2016   Atrial flutter ([place]) 09/25/2013   Near syncope 09/25/2013    Alinda Deem, [name] 03/22/2021, 2:28 PM  [facility] [address] [place], [place], [address] Phone: [phone]   Fax:  [phone]   Name: JAVOHN BASEY MRN: [id number] Date of Birth: Jan 24, 1956

## 2021-03-23 ENCOUNTER — Other Ambulatory Visit: Payer: Self-pay | Admitting: Interventional Radiology

## 2021-03-23 ENCOUNTER — Ambulatory Visit (HOSPITAL_BASED_OUTPATIENT_CLINIC_OR_DEPARTMENT_OTHER): Payer: Medicare Other | Admitting: Physical Therapy

## 2021-03-23 ENCOUNTER — Encounter (HOSPITAL_BASED_OUTPATIENT_CLINIC_OR_DEPARTMENT_OTHER): Payer: Self-pay | Admitting: Physical Therapy

## 2021-03-23 ENCOUNTER — Telehealth: Payer: Self-pay | Admitting: *Deleted

## 2021-03-23 DIAGNOSIS — R2681 Unsteadiness on feet: Secondary | ICD-10-CM

## 2021-03-23 DIAGNOSIS — R270 Ataxia, unspecified: Secondary | ICD-10-CM

## 2021-03-23 DIAGNOSIS — I8290 Acute embolism and thrombosis of unspecified vein: Secondary | ICD-10-CM

## 2021-03-23 DIAGNOSIS — R278 Other lack of coordination: Secondary | ICD-10-CM

## 2021-03-23 DIAGNOSIS — G4733 Obstructive sleep apnea (adult) (pediatric): Secondary | ICD-10-CM

## 2021-03-23 DIAGNOSIS — R41844 Frontal lobe and executive function deficit: Secondary | ICD-10-CM

## 2021-03-23 DIAGNOSIS — M6281 Muscle weakness (generalized): Secondary | ICD-10-CM

## 2021-03-23 NOTE — Telephone Encounter (Signed)
No PA required for home sleep test.

## 2021-03-23 NOTE — Therapy (Signed)
[facility] [address] [place], [place], [address] Phone: [phone]   Fax:  [phone]  Physical Therapy Treatment  Patient Details  Name: AADVIK ROKER MRN: [id number] Date of Birth: 1956-06-07 No data recorded  Encounter Date: 03/23/2021   PT End of Session - 03/23/21 1123     Visit Number 26    Number of Visits 44    Date for PT Re-Evaluation 05/04/21    Authorization Type Medicare 12/26/20 to 02/20/21; Recert 02/21/21 to 04/26/21; 10v PN/recert on 03/09/21 to 05/04/21    PT Start Time 1030    PT Stop Time 1115    PT Time Calculation (min) 45 min             Past Medical History:  Diagnosis Date   Atrial flutter (HCC)    s/p ablation   DVT (deep venous thrombosis) (HCC)    Near syncope 09/25/2013   Paroxysmal atrial fibrillation (HCC)    Stroke [facility])    initially hemorragic (not on OAC) followed by subsequent embolic stroke    Past Surgical History:  Procedure Laterality Date   A-FLUTTER ABLATION N/A 03/09/2020   Procedure: A-FLUTTER ABLATION;  Surgeon: Hillis Range, MD;  [facility]: [facility] INVASIVE CV LAB;  Service: Cardiovascular;  Laterality: N/A;   CARDIOVERSION N/A 09/27/2013   Procedure: CARDIOVERSION;  Surgeon: Lewayne Bunting, MD;  Location: [facility] ENDOSCOPY;  Service: Cardiovascular;  Laterality: N/A;   IR ANGIO EXTERNAL CAROTID SEL EXT CAROTID UNI R MOD SED  09/26/2020   IR ANGIO INTRA EXTRACRAN SEL COM CAROTID INNOMINATE BILAT MOD SED  09/26/2020   IR ANGIO VERTEBRAL SEL VERTEBRAL UNI R MOD SED  09/26/2020   IR IVC FILTER PLMT / S&I /IMG GUID/MOD SED  10/08/2020   IR IVUS EACH ADDITIONAL NON CORONARY VESSEL  11/02/2020   IR PTA VENOUS EXCEPT DIALYSIS CIRCUIT  11/02/2020   IR RADIOLOGIST EVAL & MGMT  01/17/2021   IR THROMBECT VENO MECH MOD SED  10/27/2020   IR THROMBECT VENO MECH MOD SED  11/02/2020   IR US GUIDE VASC ACCESS LEFT  10/27/2020   IR US GUIDE VASC ACCESS RIGHT  09/26/2020   IR US GUIDE VASC ACCESS RIGHT   11/02/2020   IR VENO/EXT/UNI RIGHT  11/02/2020   IR VENOCAVAGRAM IVC  10/27/2020   RETINAL DETACHMENT SURGERY     right knee arthroscopy     TEE WITHOUT CARDIOVERSION N/A 09/27/2013   Procedure: TRANSESOPHAGEAL ECHOCARDIOGRAM (TEE);  Surgeon: Lewayne Bunting, MD;  Location: [facility] ENDOSCOPY;  Service: Cardiovascular;  Laterality: N/A;   WRIST SURGERY      There were no vitals filed for this visit.   Subjective Assessment - 03/23/21 1323     Subjective "[employer], can move around easier"    Currently in Pain? No/denies                                                    Plan - 03/23/21 1324     Clinical Impression Statement Pt advanced to yellow noodle then 2 foam hand buoys for ue support with amb throughout pool. Able to decrese manual assist as pt demonstrates improved body awareness in setting and ability to regain positioning with LOB.  Pt does require extra time with slowed left upper and lower extremety muscle activation and reflexes.  (Throughout  session pt requiring manual asst  to recover x 3-4). No Impulsivity in pool today.  Pt with excellent effort. able to gain good muscle activation in core in prone and supine positions. Max cueing for erect posture as pt tends to maintain flexed knee, hip and trunk positioning submerged.    PT Treatment/Interventions Aquatic Therapy;ADLs/Self Care Home Management;Gait training;Stair training;Functional mobility training;Therapeutic activities;Therapeutic exercise;Balance training;Neuromuscular re-education;Wheelchair mobility training;Patient/family education;Energy conservation    PT Next Visit Plan Aquatics: core strengthening    PT Home Exercise Plan sit<>stand             Patient will benefit from skilled therapeutic intervention in order to improve the following deficits and impairments:  Abnormal gait, Decreased balance, Decreased endurance, Decreased mobility, Difficulty walking, Decreased  activity tolerance, Decreased coordination, Decreased strength, Impaired UE functional use, Impaired vision/preception, Impaired tone, Decreased knowledge of precautions  Visit Diagnosis: Unsteadiness on feet  Muscle weakness (generalized)  Other lack of coordination  Frontal lobe and executive function deficit  Ataxia     Problem List Patient Active Problem List   Diagnosis Date Noted   Thrombosis    Sleep disturbance    Dysphagia, post-stroke    PAF (paroxysmal atrial fibrillation) (HCC)    Acute pulmonary embolism without acute cor pulmonale (HCC)    Malnutrition of moderate degree 11/04/2020   Tracheostomy care (HCC)    Pressure injury of skin 10/23/2020   Intracranial hemorrhage (HCC)    Atrial fibrillation (HCC)    Prediabetes    Leukocytosis    Acute blood loss anemia    Hypernatremia    ICH (intracerebral hemorrhage) (HCC) 09/25/2020   Unilateral primary osteoarthritis, left knee 09/26/2016   Atrial flutter (HCC) 09/25/2013   Near syncope 09/25/2013    Jeanmarie Hubert, PT 03/23/2021, 1:34 PM  [facility] [address] [place], [place], [address] Phone: [phone]   Fax:  [phone]  Name: JATORIAN RENAULT MRN: [id number] Date of Birth: 1956-06-17

## 2021-03-23 NOTE — Telephone Encounter (Signed)
-----   Message from Tarri Fuller, CMA sent at 03/07/2021 12:01 PM EDT ----- Regarding: [facility] SLEEP STUDY Hi ladies,   I have just set the pt up with [facility] per Dr. Johney Frame. Can someone please see if he needs a PA. I appreciate your help.   Thank you  Okey Regal

## 2021-03-23 NOTE — Therapy (Signed)
OUTPATIENT PHYSICAL THERAPY Progress note/Recertification Note Patient Name: Walter [name] MRN: [id number] DOB:Nov 27, 1955, 65 y.o., male Today's Date: 03/23/2021  PCP: Alroy Dust, L.Marlou Sa, MD REFERRING PROVIDER: Alroy Dust, Carlean Jews.Marlou Sa, MD   PT End of Session - 03/23/21 1123     Visit Number 26    Number of Visits 44    Date for PT Re-Evaluation 05/04/21    Authorization Type Medicare 12/26/20 to 02/20/21; Recert 08/11/92 to 17/40/81; 44Y PN/recert on 07/08/54 to 31/4/97    PT Start Time 1030    PT Stop Time 1115    PT Time Calculation (min) 45 min                  Past Medical History:  Diagnosis Date   Atrial flutter ([place])    s/p ablation   DVT (deep venous thrombosis) ([place])    Near syncope 09/25/2013   Paroxysmal atrial fibrillation (HCC)    Stroke [facility])    initially hemorragic (not on [place]) followed by subsequent embolic stroke   Past Surgical History:  Procedure Laterality Date   A-FLUTTER ABLATION N/A 03/09/2020   Procedure: A-FLUTTER ABLATION;  Surgeon: Thompson Grayer, MD;  Location: [place] CV LAB;  Service: Cardiovascular;  Laterality: N/A;   CARDIOVERSION N/A 09/27/2013   Procedure: CARDIOVERSION;  Surgeon: Lelon Perla, MD;  Location: [facility] ENDOSCOPY;  Service: Cardiovascular;  Laterality: N/A;   IR ANGIO EXTERNAL CAROTID SEL EXT CAROTID UNI R MOD SED  09/26/2020   IR ANGIO INTRA EXTRACRAN SEL COM CAROTID INNOMINATE BILAT MOD SED  09/26/2020   IR ANGIO VERTEBRAL SEL VERTEBRAL UNI R MOD SED  09/26/2020   IR IVC FILTER PLMT / S&I /IMG GUID/MOD SED  10/08/2020   IR IVUS EACH ADDITIONAL NON CORONARY VESSEL  11/02/2020   IR PTA VENOUS EXCEPT DIALYSIS CIRCUIT  11/02/2020   IR RADIOLOGIST EVAL & MGMT  01/17/2021   IR THROMBECT VENO MECH MOD SED  10/27/2020   IR THROMBECT VENO MECH MOD SED  11/02/2020   IR US GUIDE VASC ACCESS LEFT  10/27/2020   IR US GUIDE VASC ACCESS RIGHT  09/26/2020   IR US GUIDE VASC ACCESS RIGHT  11/02/2020   IR VENO/EXT/UNI RIGHT  11/02/2020   IR VENOCAVAGRAM  IVC  10/27/2020   RETINAL DETACHMENT SURGERY     right knee arthroscopy     TEE WITHOUT CARDIOVERSION N/A 09/27/2013   Procedure: TRANSESOPHAGEAL ECHOCARDIOGRAM (TEE);  Surgeon: Lelon Perla, MD;  Location: [facility] ENDOSCOPY;  Service: Cardiovascular;  Laterality: N/A;   WRIST SURGERY     Patient Active Problem List   Diagnosis Date Noted   Thrombosis    Sleep disturbance    Dysphagia, post-stroke    PAF (paroxysmal atrial fibrillation) ([place])    Acute pulmonary embolism without acute cor pulmonale (HCC)    Malnutrition of moderate degree 11/04/2020   Tracheostomy care ([place])    Pressure injury of skin 10/23/2020   Intracranial hemorrhage (HCC)    Atrial fibrillation (HCC)    Prediabetes    Leukocytosis    Acute blood loss anemia    Hypernatremia    ICH (intracerebral hemorrhage) ([place]) 09/25/2020   Unilateral primary osteoarthritis, left knee 09/26/2016   Atrial flutter ([place]) 09/25/2013   Near syncope 09/25/2013    REFERRING DIAG: I61.3 (ICD-10-CM) - 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 ([place]) I69.193 (ICD-10-CM) - 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  Was able to ride gator down driveway and back.  Objective:  Functional tests tracking: Berg Balance scale: eval 11/56, 21/56 (03/09/21) 10 meter walk test: 0.48ms with RW (03/09/21) 5x sit to stand: 17 sec with bil UE support (03/09/21)  TODAY'S TREATMENT:              03/23/21             Pt seen for aquatic therapy today.  Treatment took place in water 3.25-4.[facility] Temp of [facility] was 92.          entered/exited the pool via stairs step to pattern with CGA with  bilat rail.                         Gait training  initially with yellow noodle progressing to 2 foam hand buoy, forward, backward and side stepping/walking cues for increased step length, posture and weight shifting submerged from 3 to 4.8 ft.  Pt allowed increased indep/close SBA for self correction/regaining standing balance.  Balance  Standing decreasing BOS with and with hand buoys for support. Added manual perturbations. Completed in 3 ft and again in 468f  Balance/strengthening Step ups on water step submerged in 4.8 ft leading with each LE x 10 R/L.  Cues and min assist for positioning,posture and placement of lle. Toe raises with hand buoys for ue support in 4 ft 2 x 10 Heel raises 2 x 10.  Cues for weight shift and balance/posture. Mod assist to sba to gain position after LOB.  Suspension Core strengthening using hand buoy translating from sup to prone, assist needed to avoid rolling x 10  Pt requires buoyancy for support and to offload joints with strengthening exercises. Viscosity of the water is needed for resistance of strengthening; water current perturbations provides challenge to standing balance unsupported, requiring increased core activation.    03/20/21 Seated trunk flexion/extension holding 5# ball 15X  Seated SB holding 5# ball 15x with PT stabilization dropping to each elbow  STS 15x with 5# ball SLR stabilization against blue t-band 60s duration x2 ea. LE DKTC over red physioball 15x   LTR 15x over red physioball  Scifit seat 20 arms 7 L2 x8m67m  03/14/21  Seated marching, alt, 6# 15x per LE with latissimus press while seated on green disk to challenge seated balance  Seated LAQs 6# alt. 15x per LE with latissimus press while seated on green disk to challenge seated balance   STS with OH punch using yellow t-band 10x  SLR circles against red band, 60s each leg in supine position  Ambulation of 200f13ftside of clinic with RW with CGA to accommodate facility  fire drill  Supine/sit transitions 5x to ea. Side with random stopping points  Quadriped on elbows WS forward and back 10x  03/12/21 Ambulation of 460ft26fh RW and CGA, cued to pause walking before pivot turning to focus on task at hand and break down tasks into individual  components to control ataxia Seated on green disk, alternating marching and LAQs with lat press to activate core, 3# ankle weights, alternating pattern, 15 reps per LE Seated on disk, forward reaching using RW 10x  Seated core using disk and 3.3# ball using hip/shoulder tosses, chops and Vs for 10 reps per task  STS with OH reach once standing, 10 reps 03/09/21 Gait training: standing up from standard chair with large based quad cane walking up around cone (10 feet away) and sitting back down in chair: 2x, cues for 3step pattern and sequencing, min A to mod A required 1 x 230 feet with RW, CGA to min A with cues for smaller steps BBS performed 5x sit to stand performed.   03/01/2021 Pt seen for aquatic therapy today.  Treatment took place in water 3.25-4.8 ft in depth at the [employer] pool. Temp of water was 92. entered/exited the pool via stairs step to pattern minimum assist with bilat rail.  Introduced pt to aquatic setting.                           Amb using water walker. forward, backward and side stepping/walking cues for increased step length, posture and weight shifting x 12 minutes. Pt requiring mod to min asst. Multiple LOB.  Seated -sit to stand transfers from water bench submerged to waist when standing.  Cueing for weight shift/execution. Initally completed with water walker, progressed to thick yellow noodle.  Pt requiring min-cga for completion. 2 x 5 Marching x 10 LAQ x 10.  Standing -step ups x leading with L/R LE.  Cueing for decreased impulsiveness /improved concentration on finishing first movement before moving to next. -toe raises 2 x 10 Cueing for balance/slowing  movements -shoulder flex and add/abd x 10. Using 2 foam hand buoys.   Supine suspension Flutter kicking x 5 min supported by Pt  Pt requires buoyancy for support and to offload joints with strengthening exercises. Viscosity of the water is needed for resistance of strengthening; water current perturbations provides challenge to standing balance unsupported, requiring increased core activation.   02/28/21: Gait training: 1 x 460 feet with tactile cues at pelvis to guide weight shifts cues for slower cadence and turning slowly Standing on floor: OH reaching and fwd reaching: alternating between reaches: 10x each Standing touching toes: 5x Gait training: chair to chair transfer with 180 deg turn: quad cane, CGA to min A: 4x  02/26/21: Gait training: 1 x 420 feet with tactile cues at pelvis to guide weight shifts cues for slower cadence and turning slowly Sit to stand: 10x no HHA Standing at countertop with wide BOS: moving basket with 20lbs from side to side to increase core engagement and weight shifts Picking up basket with 20lb from counter to chest: 5x Working on ankle strategy: standing heel and toe raises without HHA: very close guarding: 2 x 10  Standing contralateral reaching: 10x R and L    PATIENT EDUCATION: Education details: discussed reassessment findings Person educated: Patient and caregiver Education method: Explanation, Demonstration, and Verbal cues Education comprehension: verbalized understanding     HOME EXERCISE PROGRAM: Continue daily walking with CG assist  ASSESSMENT:   CLINICAL IMPRESSION: Pt advanced to yellow noodle then 2 foam hand buoys for ue support with amb throughout pool. Able to decrese manual assist as pt demonstrates improved body awareness in setting and ability to regain positioning with LOB.  Pt does require extra time with slowed left upper and lower extremety muscle activation and reflexes.  (Throughout session pt requiring manual asst  to  recover x 3-4). No Impulsivity in pool today.  Pt with excellent effort. able to gain good muscle  activation in core in prone and supine positions. Max cueing for erect posture as pt tends to maintain flexed knee, hip and trunk positioning submerged.   REHAB POTENTIAL: Good   CLINICAL DECISION MAKING: Stable/uncomplicated  GOALS: Goals reviewed with patient? Yes   SHORT TERM GOALS:   STG Name Target Date Goal status  1 Patient will be able to perform chair to chair transfer with CGA/SBA with use of RW for safety to improve independence. Baseline: min to mod A (12/26/20); CGA/minA (03/09/21) 01/23/2021 02/22/21 Able to transfer STS and stand pivot with SBA/CGA  2 Pt will be able to ambulate 115' with RW with CGA to min A with RW for safety to improve independence Baseline: min to mod A with RW 145'; 230 feet with RW and CGA to MIN A 01/23/2021 03/09/21 Goal met  3 Pt will be able to maintain standing balance for 30 sec to improve static balance Baseline: 10 sec before needing help due to LOB (12/26/20) 01/23/2021 02/05/21 able to stand with S for 30s, goal met  4. Pt will be able to perform sit to stand transfers with SBA to improve independence 04/06/21 Revised 03/09/21  5. Pt will be able to perform 230 feet of walking with RW with SBA/CGA to improve household ambulation  04/06/21 Revised 03/09/21    LONG TERM GOALS:   LTG Name Target Date Goal status  1 Pt will demo >5 points improvement on Berg Balance Scale to improve static balance Baseline: 11/56 01/03/21 02/20/2021 INITIAL 03/09/21 BBS 21/56  2 Pt will be able to ambulate >500 feet with RW and SBA to improve walking endurance and safety. Baseline: 145' with RW min to mod A; 230 feet with CGA to Min A (03/09/21)  05/18/21 Progressing continue   3 Pt will be able to perform gait speed improvement by 0.82ms to improve community ambulation. Baseline:0.234m (03/09/21) 05/16/21 Progressing continue  4 Pt will demo 5 sec improvement in 5x sit to stand  (with or without UE support) to improve functional strength Baseline: 5x sit to stand with bil UE 17 sec;  05/18/21 Progressing continue    PLAN: PT FREQUENCY: 2-3x/week   PT DURATION: 8 weeks   PLANNED INTERVENTIONS: Aquatic therapy, Therapeutic exercises, Therapeutic activity, Neuro Muscular re-education, Balance training, Gait training, Patient/Family education, Joint mobilization, Stair training, Visual/preceptual remediation/compensation, Wheelchair mobility training, Cryotherapy, Moist heat, and Manual therapy   PLAN FOR NEXT SESSION:  Balance challenges progressed with seated core strengthening and tall kneel tasks as knees tolerate Standing/walking tasks in // bars, floor targets and stepping tasks, higher level gait training-stepping over and around obstacles, resisted walking, seated tasks on disk Aquatics: core strengthening/balance  Visit Diagnosis: Unsteadiness on feet  Muscle weakness (generalized)  Other lack of coordination  Frontal lobe and executive function deficit  Ataxia     Problem List Patient Active Problem List   Diagnosis Date Noted   Thrombosis    Sleep disturbance    Dysphagia, post-stroke    PAF (paroxysmal atrial fibrillation) (HCC)    Acute pulmonary embolism without acute cor pulmonale (HCC)    Malnutrition of moderate degree 11/04/2020   [place]acheostomy care ([place]   Pressure injury of skin 10/23/2020   Intracranial hemorrhage (HCC)    Atrial fibrillation (HCC)    Prediabetes    Leukocytosis    Acute blood loss anemia    Hypernatremia    [place]int[place]bral hemorrhage) ([place]/28/2022   Unilateral primary osteoarthritis, left knee 09/26/2016   Atrial flutter ([place]  09/25/2013   Near syncope 09/25/2013    Vedia Pereyra, PT 03/23/2021, 2:07 PM  [facility] [address] [place], [place], [address] Phone: [phone]   Fax:  [phone]

## 2021-03-27 ENCOUNTER — Other Ambulatory Visit: Payer: Self-pay

## 2021-03-27 ENCOUNTER — Ambulatory Visit: Payer: Medicare Other | Admitting: Occupational Therapy

## 2021-03-27 ENCOUNTER — Ambulatory Visit: Payer: Medicare Other

## 2021-03-27 ENCOUNTER — Encounter: Payer: Self-pay | Admitting: Occupational Therapy

## 2021-03-27 DIAGNOSIS — R270 Ataxia, unspecified: Secondary | ICD-10-CM

## 2021-03-27 DIAGNOSIS — M6281 Muscle weakness (generalized): Secondary | ICD-10-CM

## 2021-03-27 DIAGNOSIS — I69218 Other symptoms and signs involving cognitive functions following other nontraumatic intracranial hemorrhage: Secondary | ICD-10-CM

## 2021-03-27 DIAGNOSIS — R41842 Visuospatial deficit: Secondary | ICD-10-CM

## 2021-03-27 DIAGNOSIS — R471 Dysarthria and anarthria: Secondary | ICD-10-CM

## 2021-03-27 DIAGNOSIS — R41841 Cognitive communication deficit: Secondary | ICD-10-CM

## 2021-03-27 DIAGNOSIS — R2681 Unsteadiness on feet: Secondary | ICD-10-CM

## 2021-03-27 DIAGNOSIS — R208 Other disturbances of skin sensation: Secondary | ICD-10-CM

## 2021-03-27 DIAGNOSIS — R278 Other lack of coordination: Secondary | ICD-10-CM

## 2021-03-27 DIAGNOSIS — R41844 Frontal lobe and executive function deficit: Secondary | ICD-10-CM

## 2021-03-27 NOTE — Therapy (Signed)
OUTPATIENT PHYSICAL THERAPY TREATMENT NOTE Patient Name: Walter [name] MRN: [id number] DOB:Nov 09, 1955, 65 y.o., male Today's Date: 03/27/2021  PCP: Alroy Dust, L.Marlou Sa, MD REFERRING PROVIDER: Alroy Dust, Carlean Jews.Marlou Sa, MD   PT End of Session - 03/27/21 1012     Visit Number 27    Number of Visits 44    Date for PT Re-Evaluation 05/04/21    Authorization Type Medicare 12/26/20 to 02/20/21; Recert 1/74/94 to 49/67/59; 16B PN/recert on 02/01/65 to 59/9/35    Authorization Time Period 03/09/21-05/04/21    Progress Note Due on Visit 30    PT Start Time 1015    PT Stop Time 1100    PT Time Calculation (min) 45 min    Equipment Utilized During Treatment Gait belt    Activity Tolerance Patient tolerated treatment well    Behavior During Therapy Impulsive                   Past Medical History:  Diagnosis Date   Atrial flutter ([place])    s/p ablation   DVT (deep venous thrombosis) ([place])    Near syncope 09/25/2013   Paroxysmal atrial fibrillation ([place])    Stroke [facility])    initially hemorragic (not on [place]) followed by subsequent embolic stroke   Past Surgical History:  Procedure Laterality Date   A-FLUTTER ABLATION N/A 03/09/2020   Procedure: A-FLUTTER ABLATION;  Surgeon: Thompson Grayer, MD;  Location: [place] CV LAB;  Service: Cardiovascular;  Laterality: N/A;   CARDIOVERSION N/A 09/27/2013   Procedure: CARDIOVERSION;  Surgeon: Lelon Perla, MD;  Location: [facility] ENDOSCOPY;  Service: Cardiovascular;  Laterality: N/A;   IR ANGIO EXTERNAL CAROTID SEL EXT CAROTID UNI R MOD SED  09/26/2020   IR ANGIO INTRA EXTRACRAN SEL COM CAROTID INNOMINATE BILAT MOD SED  09/26/2020   IR ANGIO VERTEBRAL SEL VERTEBRAL UNI R MOD SED  09/26/2020   IR IVC FILTER PLMT / S&I /IMG GUID/MOD SED  10/08/2020   IR IVUS EACH ADDITIONAL NON CORONARY VESSEL  11/02/2020   IR PTA VENOUS EXCEPT DIALYSIS CIRCUIT  11/02/2020   IR RADIOLOGIST EVAL & MGMT  01/17/2021   IR THROMBECT VENO MECH MOD SED  10/27/2020   IR THROMBECT VENO  MECH MOD SED  11/02/2020   IR US GUIDE VASC ACCESS LEFT  10/27/2020   IR US GUIDE VASC ACCESS RIGHT  09/26/2020   IR US GUIDE VASC ACCESS RIGHT  11/02/2020   IR VENO/EXT/UNI RIGHT  11/02/2020   IR VENOCAVAGRAM IVC  10/27/2020   RETINAL DETACHMENT SURGERY     right knee arthroscopy     TEE WITHOUT CARDIOVERSION N/A 09/27/2013   Procedure: TRANSESOPHAGEAL ECHOCARDIOGRAM (TEE);  Surgeon: Lelon Perla, MD;  Location: [facility] ENDOSCOPY;  Service: Cardiovascular;  Laterality: N/A;   WRIST SURGERY     Patient Active Problem List   Diagnosis Date Noted   Thrombosis    Sleep disturbance    Dysphagia, post-stroke    PAF (paroxysmal atrial fibrillation) ([place])    Acute pulmonary embolism without acute cor pulmonale (HCC)    Malnutrition of moderate degree 11/04/2020   Tracheostomy care ([employer] [facility])    Pressure injury of skin 10/23/2020   Intracranial hemorrhage (HCC)    Atrial fibrillation ([place])    Prediabetes    Leukocytosis    Acute blood loss anemia    Hypernatremia    ICH (intracerebral hemorrhage) ([place]) 09/25/2020   Unilateral primary osteoarthritis, left knee 09/26/2016   Atrial flutter ([place]) 09/25/2013   Near syncope 09/25/2013  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 No pain. No falls. Pool sessions have been helpful in regaining balance  TODAY'S TREATMENT:  03/27/21  Seated on green disk, forward reaching to floor wih 3KG ball followed by [facility] reaching ea. For 15 reps Seated trunk flexion/extension over sitting disk, extension blocked by bolster, 15x.  Seated B rotation holding 3KG ball 15x, hip to hip  Ambulation with QC  21f, SPC 361fthen SPM[facility] Medical Centerith 3# wt 11582frequiring Min/ModA for balance corrections  From green disk, STS 15x with 3KG ball   Seated on green disk, OH press with 3KG ball      03/22/21  Completed gait training x 230 ft with RW, PT facilitating at pelvis for improved weight shift and cues for controlled stepping to promote stability. Then progressed to gait training with negotiation around obstacles (cones) x 3 laps, cues to stay aligned within walker, most challenge noted with sharp turns. CGA throughout. Continued cues for improved turn and alignment with surface prior to descent with sit <> stand.   Seated on green air disc without UE support, completed lateral weight shift to R/L x 15 reps working on control and cues for posture/core activation. Then progressed to weight shift with reaching for cone in various directions x 10 reps.   Tall Kneeling on Mat with BUE support on Bench positioned in front of patient. CGA to get into/out of position. In tall kneeling completed hip hinges 2 x 10 reps working on improved return to upright and maintian hip extension, cues to keep posture upright. Then completed alternating UE raises x 10 reps bilaterally. Lateral reaching to PT's hand x 5 reps bilateral directions. Then trialed no UE support, difficulty maintaining tall kneeling position without assistance.    03/20/21 Seated trunk flexion/extension holding 5# ball 15X  Seated SB holding 5# ball 15x with PT stabilization dropping to each elbow  STS 15x with 5# ball SLR stabilization against blue t-band 60s duration x2 ea. LE DKTC over red physioball 15x   LTR 15x over red physioball  Scifit seat 20 arms 7 L2 x8mi85m 03/14/21  Seated marching, alt, 6# 15x per LE with latissimus press while seated on green disk to challenge seated balance  Seated LAQs 6# alt. 15x per LE with latissimus press while seated on green disk to challenge seated balance   STS with OH punch using yellow t-band 10x  SLR circles  against red band, 60s each leg in supine position  Ambulation of 200ft47fside of clinic with RW with CGA to accommodate facility fire drill  Supine/sit transitions 5x to ea. Side with random stopping points  Quadriped on elbows WS forward and back 10x  03/12/21 Ambulation of 460ft 5f RW and CGA, cued to pause walking before pivot turning to focus on task at hand and break down tasks into individual  components to control ataxia Seated on green disk, alternating marching and LAQs with lat press to activate core, 3# ankle weights, alternating pattern, 15 reps per LE Seated on disk, forward reaching using RW 10x              Seated core using disk and 3.3# ball using hip/shoulder  tosses, chops and Vs for 10 reps per task  STS with OH reach once standing, 10 reps 03/09/21 Gait training: standing up from standard chair with large based quad cane walking up around cone (10 feet away) and sitting back down in chair: 2x, cues for 3step pattern and sequencing, min A to mod A required 1 x 230 feet with RW, CGA to min A with cues for smaller steps BBS performed 5x sit to stand performed.   03/01/2021 Pt seen for aquatic therapy today.  Treatment took place in water 3.25-4.8 ft in depth at the [employer] [facility] pool. Temp of water was 92. entered/exited the pool via stairs step to pattern independently with bilat rail.  Introduced pt to aquatic setting.                           Amb using water walker. forward, backward and side stepping/walking cues for increased step length, posture and weight shifting x 12 minutes. Pt requiring mod to min asst. Multiple LOB.  Seated -sit to stand transfers from water bench submerged to waist when standing.  Cueing for weight shift/execution. Initally completed with water walker, progressed to thick yellow noodle.  Pt requiring min-cga for completion. 2 x 5 Marching x 10 LAQ x 10.  Standing -step ups x leading with L/R LE.  Cueing for decreased impulsiveness  /improved concentration on finishing first movement before moving to next. -toe raises 2 x 10 Cueing for balance/slowing movements  Supine suspension Flutter kicking x 5 min supported by Pt  Pt requires buoyancy for support and to offload joints with strengthening exercises. Viscosity of the water is needed for resistance of strengthening; water current perturbations provides challenge to standing balance unsupported, requiring increased core activation.   02/28/21: Gait training: 1 x 460 feet with tactile cues at pelvis to guide weight shifts cues for slower cadence and turning slowly Standing on floor: OH reaching and fwd reaching: alternating between reaches: 10x each Standing touching toes: 5x Gait training: chair to chair transfer with 180 deg turn: quad cane, CGA to min A: 4x  02/26/21: Gait training: 1 x 420 feet with tactile cues at pelvis to guide weight shifts cues for slower cadence and turning slowly Sit to stand: 10x no HHA Standing at countertop with wide BOS: moving basket with 20lbs from side to side to increase core engagement and weight shifts Picking up basket with 20lb from counter to chest: 5x Working on ankle strategy: standing heel and toe raises without HHA: very close guarding: 2 x 10  Standing contralateral reaching: 10x R and L    PATIENT EDUCATION: Education details: Scheduling more PT visits for October Person educated: Patient and caregiver Education method: Consulting civil engineer, Media planner, and Verbal cues Education comprehension: verbalized understanding     HOME EXERCISE PROGRAM: Continue daily walking with CG assist  ASSESSMENT:   CLINICAL IMPRESSION:  Todays session continued to focus on core strength as well as seated balance tasks to improve stability as patient tends to overshoot neutral with position changes and reciprocal tasks.  Able to ambulate with weighted SPC and weighted LLE 144f with Min/ModA to provide support as well as cue for sequence  and pacing.  [facility]  REHAB POTENTIAL: Good   CLINICAL DECISION MAKING: Stable/uncomplicated  GOALS: Goals reviewed with patient? Yes   SHORT TERM GOALS:   STG Name Target Date Goal status  1 Patient will be  able to perform chair to chair transfer with CGA/SBA with use of RW for safety to improve independence. Baseline: min to mod A (12/26/20); CGA/minA (03/09/21) 01/23/2021 03/27/21 Able to transfer STS and stand pivot with SBA, goal met  2 Pt will be able to ambulate 115' with RW with CGA to min A with RW for safety to improve independence Baseline: min to mod A with RW 145'; 230 feet with RW and CGA to MIN A 01/23/2021 03/09/21 Goal met  3 Pt will be able to maintain standing balance for 30 sec to improve static balance Baseline: 10 sec before needing help due to LOB (12/26/20) 01/23/2021 02/05/21 able to stand with S for 30s, goal met  4. Pt will be able to perform sit to stand transfers with SBA to improve independence 04/06/21 Goal met 03/27/21  5. Pt will be able to perform 230 feet of walking with RW with SBA/CGA to improve household ambulation  04/06/21 Revised 03/09/21    LONG TERM GOALS:   LTG Name Target Date Goal status  1 Pt will demo >5 points improvement on Berg Balance Scale to improve static balance Baseline: 11/56 01/03/21 02/20/2021 INITIAL 03/09/21 BBS 21/56  2 Pt will be able to ambulate >500 feet with RW and SBA to improve walking endurance and safety. Baseline: 145' with RW min to mod A; 230 feet with CGA to Min A (03/09/21)  05/18/21 Progressing continue   3 Pt will be able to perform gait speed improvement by 0.80ms to improve community ambulation. Baseline:0.264m (03/09/21) 05/16/21 Progressing continue  4 Pt will demo 5 sec improvement in 5x sit to stand (with or without UE support) to improve functional strength Baseline: 5x sit to stand with bil UE 17 sec;  05/18/21 Progressing continue    PLAN: PT FREQUENCY: 2-3x/week   PT  DURATION: 8 weeks   PLANNED INTERVENTIONS: Aquatic therapy, Therapeutic exercises, Therapeutic activity, Neuro Muscular re-education, Balance training, Gait training, Patient/Family education, Joint mobilization, Stair training, Visual/preceptual remediation/compensation, Wheelchair mobility training, Cryotherapy, Moist heat, and Manual therapy   PLAN FOR NEXT SESSION:  Balance challenges progressed with seated core strengthening and tall kneel tasks as knees tolerate Standing/walking tasks with weighted cane, floor targets and stepping tasks, higher level gait training-stepping over and around obstacles, resisted walking, seated tasks on disk  Visit Diagnosis: Other lack of coordination  Ataxia  Unsteadiness on feet  Muscle weakness (generalized)     Problem List Patient Active Problem List   Diagnosis Date Noted   Thrombosis    Sleep disturbance    Dysphagia, post-stroke    PAF (paroxysmal atrial fibrillation) ([facility] [place]   Acute pulmonary embolism without acute cor pulmonale ([facility] [place]   Malnutrition of moderate degree 11/04/2020   Tracheostomy care ([facility]   Pressure injury of skin 10/23/2020   Intracranial hemorrhage (HCC)    Atrial fibrillation ([facility]CL[facility] [place]   Prediabetes    Leukocytosis    Acute blood loss anemia    Hypernatremia    ICH (intracerebral hemorrhage) ([facility] [place]03/28/2022   Unilateral primary osteoarthritis, left knee 09/26/2016   Atrial flutter ([facility]03/28/2015   Near syncope 09/25/2013    JeLanice ShirtsPT 03/27/2021, 10:14 AM  [address]hone: [phone] Fax:  [phone]

## 2021-03-27 NOTE — Therapy (Signed)
[facility] [address] [place], [place], [address] Phone: [phone]   Fax:  [phone]  Speech Language Pathology Treatment  Patient Details  Name: Walter [name] MRN: [id number] Date of Birth: 1955-10-03 Referring Provider (SLP): Charlett Blake, MD   Encounter Date: 03/27/2021   End of Session - 03/27/21 0926     Visit Number 25    Number of Visits 33    Date for SLP Re-Evaluation 04/20/21    Authorization Type Medicare    SLP Start Time 0851    SLP Stop Time  0930    SLP Time Calculation (min) 39 min    Activity Tolerance Patient tolerated treatment well             Past Medical History:  Diagnosis Date   Atrial flutter ([place])    s/p ablation   DVT (deep venous thrombosis) ([place])    Near syncope 09/25/2013   Paroxysmal atrial fibrillation ([place])    St[facility])    initially hemorragic (not on [place]) followed by subsequent embolic stroke    Past Surgical History:  Procedure Laterality Date   A-FLUTTER ABLATION N/A 03/09/2020   Procedure: A-FLUTTER ABLATION;  Surgeon: Thompson Grayer, MD;  Location: [place] CV LAB;  Service: Cardiovascular;  Laterality: N/A;   CARDIOVERSION N/A 09/27/2013   Procedure: CARDIOVERSION;  Surgeon: Lelon Perla, MD;  Location: [facility] ENDOSCOPY;  Service: Cardiovascular;  Laterality: N/A;   IR ANGIO EXTERNAL CAROTID SEL EXT CAROTID UNI R MOD SED  09/26/2020   IR ANGIO INTRA EXTRACRAN SEL COM CAROTID INNOMINATE BILAT MOD SED  09/26/2020   IR ANGIO VERTEBRAL SEL VERTEBRAL UNI R MOD SED  09/26/2020   IR IVC FILTER PLMT / S&I /IMG GUID/MOD SED  10/08/2020   IR IVUS EACH ADDITIONAL NON CORONARY VESSEL  11/02/2020   IR PTA VENOUS EXCEPT DIALYSIS CIRCUIT  11/02/2020   IR RADIOLOGIST EVAL & MGMT  01/17/2021   IR THROMBECT VENO MECH MOD SED  10/27/2020   IR THROMBECT VENO MECH MOD SED  11/02/2020   IR US GUIDE VASC ACCESS LEFT  10/27/2020   IR US GUIDE VASC ACCESS RIGHT  09/26/2020   IR US  GUIDE VASC ACCESS RIGHT  11/02/2020   IR VENO/EXT/UNI RIGHT  11/02/2020   IR VENOCAVAGRAM IVC  10/27/2020   RETINAL DETACHMENT SURGERY     right knee arthroscopy     TEE WITHOUT CARDIOVERSION N/A 09/27/2013   Procedure: TRANSESOPHAGEAL ECHOCARDIOGRAM (TEE);  Surgeon: Lelon Perla, MD;  Location: [facility] ENDOSCOPY;  Service: Cardiovascular;  Laterality: N/A;   WRIST SURGERY      There were no vitals filed for this visit.   Subjective Assessment - 03/27/21 0856     Subjective "The extent of communication has incr'd but the subtelties of the communication is still a problem." (intermittent hyper-loudness due to ataxia of diaphragm)    Patient is accompained by: --   friend   Currently in Pain? No/denies                   ADULT SLP TREATMENT - 03/27/21 0859       General Information   Behavior/Cognition Alert;Cooperative;Pleasant mood;Requires cueing      Treatment Provided   Treatment provided Cognitive-Linquistic      Cognitive-Linquistic Treatment   Treatment focused on Cognition;Dysarthria    Skilled Treatment "If I worked a full day it might be a problem but with these surgical strikes it's not as much  of a problem." (pt, re: is his attention more problematic now than before the CVA?). SLP and pt discussed shift in focus to cognitive linguistics rather than dysarthria. Pt in total agreement. SLP to target dysarthria today so he has something to cont to work on for his dysarthric speech at home. Pt used speech compensations for 100% intelligiblity today with "tongue twisters" and rhyming sentences. Pt told to pick 15 of each of these/day BID to habitualize overarticulation. SLP to administer CLQT next session and possibly make new goals for cognition if clinicially appropriate.      Assessment / Recommendations / Plan   Plan Continue with current plan of care      Progression Toward Goals   Progression toward goals Progressing toward goals              SLP Education -  03/27/21 0925     Education Details dysarthria HEP    Person(s) Educated Patient;Caregiver(s)    Methods Explanation;Demonstration;Handout    Comprehension Verbalized understanding;Returned demonstration;Verbal cues required              SLP Short Term Goals - 02/21/21 1552       SLP SHORT TERM GOAL #1   Title Pt will complete dysarthria HEP with occasional min A over 2 sessions    Status Not Met      SLP SHORT TERM GOAL #2   Title Pt will produce loud /a/ or "hey!" with at least high 80s dB average over 2 sessions    Status Not Met      SLP SHORT TERM GOAL #3   Title Pt will generate abdominal breathing in 16/20 answers to SLP questions over 2 sessions    Baseline 01-18-21    Status Partially Met      SLP SHORT TERM GOAL #4   Title Pt will demonstrate dysarthria compensations in 10 minute simple to mod complex conversation to be 75% intelligibile with occasional min A over 2 sessions    Status Not Met      SLP SHORT TERM GOAL #5   Title Pt will complete formal cognitive linguistic assessment in first 1-2 ST sessions    Status Achieved      SLP SHORT TERM GOAL #6   Title Pt will use memory and attention compensations for appointments, medicine management, and other daily activities with occasional min A over 2 sessions    Status Not Met              SLP Long Term Goals - 03/27/21 1653       SLP LONG TERM GOAL #1   Title Pt will complete dysarthria HEP with rare min A over 2 sessions    Baseline 03-20-21, 03-22-21    Status Achieved      SLP LONG TERM GOAL #2   Title Pt will demonstrate dysarthria compensations in 10 minute simple conversation to be 90% intelligibile with rare min A over 3 sessions    Baseline 03-22-21    Time 8    Period Weeks    Status On-going   achieved, and revised for 10 minutes simple to mod complex conversation over 3 sessions   Target Date 04/20/21      SLP LONG TERM GOAL #3   Title Pt will demo improved sustained and selective  attention during therapeutic tasks with less than 3 cues for active listening during 2 sessions    Time 1    Period Weeks    Status Deferred  SLP LONG TERM GOAL #4   Title Pt will demonstrate emergent and anticipatory awareness of impulsivity with less than 5 cues to modify behaviors during 2 sessions    Status On-going   and ongoing   Target Date 04/20/21      SLP LONG TERM GOAL #5   Title Pt will report reduced frustration and improved communication effectiveness via PROM by 2 points at last ST sessions    Baseline CES: 15.5 & V-RQOL:35    Time 8    Period Weeks    Status On-going    Target Date 04/20/21      SLP LONG TERM GOAL #6   Title Pt will selectively attend to cognitive linguistic task for 5 minutes with 3 or less cues for redirection over 3 sessions    Time 8    Period Weeks    Status On-going    Target Date 04/20/21              Plan - 03/27/21 1651     Clinical Impression Statement "Walter [name]" presents for OPST intervention secondary to stroke in March 2022. Pt worked with SLP continuing in training pt in breath support and compensations for audible, intelligible speech in converesation. He was provided an extended dysarthria HEP today which he was instructed to complete at home BID including rhyming sentences and 'tongue twisters". He continues to demonstrate reduced selective attention, processing and problem solving, although pt believes cognition has improved. SLP to formally assess cognition with cogntive linguistic quick test (CLQT) next session and goals added if necessary. Pt agrees focus shift from speech to cognition will occur if necessary and pt agrees with this plan. Skilled ST is cont'd warranted to address dysarthria and cognition to maximize communication effectiveness and optimize functional independence and safety at home.    Speech Therapy Frequency 2x / week    Duration --   16 weeks   Treatment/Interventions Cueing hierarchy;Functional  tasks;Patient/family education;Cognitive reorganization;Multimodal communcation approach;Language facilitation;Compensatory techniques;Internal/external aids;SLP instruction and feedback    Potential to Achieve Goals Fair    Potential Considerations Ability to learn/carryover information;Cooperation/participation level    SLP Home Exercise Plan provided    Consulted and Agree with Plan of Care Patient             Patient will benefit from skilled therapeutic intervention in order to improve the following deficits and impairments:   Dysarthria and anarthria  Cognitive communication deficit    Problem List Patient Active Problem List   Diagnosis Date Noted   Thrombosis    Sleep disturbance    Dysphagia, post-stroke    PAF (paroxysmal atrial fibrillation) (HCC)    Acute pulmonary embolism without acute cor pulmonale (HCC)    Malnutrition of moderate degree 11/04/2020   Tracheostomy care ([place])    Pressure injury of skin 10/23/2020   Intracranial hemorrhage (HCC)    Atrial fibrillation ([place])    Prediabetes    Leukocytosis    Acute blood loss anemia    Hypernatremia    ICH (intracerebral hemorrhage) ([place]) 09/25/2020   Unilateral primary osteoarthritis, left knee 09/26/2016   Atrial flutter ([place]) 09/25/2013   Near syncope 09/25/2013    [facility] ,MS, CCC-SLP  03/27/2021, 4:53 PM  [facility] [address] [place], [place], [address] Phone: [phone]   Fax:  [phone]   Name: Walter [name] MRN: [id number] Date of Birth: [birth year]

## 2021-03-27 NOTE — Patient Instructions (Signed)
Speech Exercises  Say 15, twice a day              Call the cat "Buttercup" A calendar of [place] Four floors to cover Yellow oil ointment Fellow lovers of felines Catastrophe in [place] Plump plumbers' plums The church's chimes chimed Telling time 'til eleven Five valve levers Keep the gate closed Go see that guy Fat cows give milk Automatic Data Gophers Fat frogs flip freely Tuck Tommy into bed ======================== Get that game to [employer] Thick thistles stick together Cinnamon aluminum linoleum Black bugs blood Lovely lemon linament Red leather, yellow leather  Big grocery buggy    Purple baby carriage [facility] Proper copper coffee pot Ripe purple cabbage Three free throws [employer] tackled  [employer] dipped the dessert  ========================== [place] [employer] that [employer] Shirts shrink, shells shouldn't [place] 49ers Take the tackle box File the flash message Give me five flapjacks Fundamental relatives Dye the pets purple Talking [place] time after time Dark chocolate chunks Political landscape of the kingdom Actuary genius We played yo-yos yesterday

## 2021-03-27 NOTE — Therapy (Signed)
[facility] [address] [place], [place], [address] Phone: [phone]   Fax:  [phone]  Occupational Therapy Treatment  Patient Details  Name: Walter [name] MRN: [id number] Date of Birth: Oct 21, 1955 Referring Provider (OT): Dr. Alysia Penna   Encounter Date: 03/27/2021   OT End of Session - 03/27/21 0928     Visit Number 24    Number of Visits 67    Date for OT Re-Evaluation 04/27/21    Authorization Type Medicare Part A & B- need to add [place] - Visit Number 23    Authorization - Number of Visits 30    Progress Note Due on Visit 54    OT Start Time 0933    OT Stop Time 1015    OT Time Calculation (min) 42 min    Activity Tolerance Patient tolerated treatment well    Behavior During Therapy Impulsive             Past Medical History:  Diagnosis Date   Atrial flutter ([place])    s/p ablation   DVT (deep venous thrombosis) ([place])    Near syncope 09/25/2013   Paroxysmal atrial fibrillation ([place])    Stroke [facility])    initially hemorragic (not on [place]) followed by subsequent embolic stroke    Past Surgical History:  Procedure Laterality Date   A-FLUTTER ABLATION N/A 03/09/2020   Procedure: A-FLUTTER ABLATION;  Surgeon: Thompson Grayer, MD;  Location: [place] CV LAB;  Service: Cardiovascular;  Laterality: N/A;   CARDIOVERSION N/A 09/27/2013   Procedure: CARDIOVERSION;  Surgeon: Lelon Perla, MD;  Location: [facility] ENDOSCOPY;  Service: Cardiovascular;  Laterality: N/A;   IR ANGIO EXTERNAL CAROTID SEL EXT CAROTID UNI R MOD SED  09/26/2020   IR ANGIO INTRA EXTRACRAN SEL COM CAROTID INNOMINATE BILAT MOD SED  09/26/2020   IR ANGIO VERTEBRAL SEL VERTEBRAL UNI R MOD SED  09/26/2020   IR IVC FILTER PLMT / S&I /IMG GUID/MOD SED  10/08/2020   IR IVUS EACH ADDITIONAL NON CORONARY VESSEL  11/02/2020   IR PTA VENOUS EXCEPT DIALYSIS CIRCUIT  11/02/2020   IR RADIOLOGIST EVAL & MGMT  01/17/2021   IR THROMBECT VENO  MECH MOD SED  10/27/2020   IR THROMBECT VENO MECH MOD SED  11/02/2020   IR US GUIDE VASC ACCESS LEFT  10/27/2020   IR US GUIDE VASC ACCESS RIGHT  09/26/2020   IR US GUIDE VASC ACCESS RIGHT  11/02/2020   IR VENO/EXT/UNI RIGHT  11/02/2020   IR VENOCAVAGRAM IVC  10/27/2020   RETINAL DETACHMENT SURGERY     right knee arthroscopy     TEE WITHOUT CARDIOVERSION N/A 09/27/2013   Procedure: TRANSESOPHAGEAL ECHOCARDIOGRAM (TEE);  Surgeon: Lelon Perla, MD;  Location: [facility] ENDOSCOPY;  Service: Cardiovascular;  Laterality: N/A;   WRIST SURGERY      There were no vitals filed for this visit.   Subjective Assessment - 03/27/21 0927     Subjective  [place]    Patient is accompanied by: --   aide/caregiver   Pertinent History ICH of brainstem (complicated by hydrocephalus, DVTs, and tracheostomy).  PMH:  a-fib    Limitations fall risk, ataxia, impulsivity    Patient Stated Goals use L side of body better (decrease ataxia), stay active and heal brain    Currently in Pain? No/denies             Arm bike x5 min level 5  for conditioning and reciprocal movement with min cueing for control.  Standing and ambulating with RW with walker tray for simple environmental scanning/negotiation and reaching to retrieve objects from various locations with min-mod cueing and CGA for safety/RW placement.  Pt demo improved balance/positioning when reaching/retrieving object with RUE, but continues to need cueing to get closer to object/step forward with LLE when reaching with LUE/on L side.  Pt continues to have some impulsivity (but improved) and did best with anticipatory cueing vs cueing to correct.  Cautioned pt to move slowly and not to retrieve objects from floor at this time.  Sitting, closed-chain shoulder flex and chest press with scapular retraction with BUEs with min cueing for slow, controlled movements.  Sitting, functional reaching to grasp/release cylinder objects away from  body, but cueing to stabilize LUE proximally and move slowly with improved performance with repetition/cues.  Sitting, fastening/unfastening buttons on tabletop for incr bilateral hand coordination with incr time/min-mod difficulty.              OT Short Term Goals - 03/12/21 0859       OT SHORT TERM GOAL #1   Title Pt/caregiver will be independent with initial HEP for LUE coordination and R hand strength.--    Time 4    Period Weeks    Status Achieved   needs continued reinforcement, pt is not using consistently   Target Date 03/02/21      OT SHORT TERM GOAL #2   Title Pt/caregiver will be independent with vision HEP and visual compensation strategies.    Time 4    Period Weeks    Status Achieved   Pt/ family verbalize understanding of vision HEP, needs continued reinforcement of compensatory strategeis- pt is not performing head turns     OT SHORT TERM GOAL #3   Title Pt will improve LUE coordination for ADLs as shown by improving score on box and blocks test by at least 6.    Baseline L-21 blocks    Time 4    Period Weeks    Status On-going   25 blocks.  03/12/21:  24 blocks     OT SHORT TERM GOAL #4   Title Pt will perform simple snack prep/home maintenance task from w/c level mod I.    Time 4    Period Weeks    Status Achieved   met per pt's wife     OT SHORT TERM GOAL #5   Title Pt will verbalize understanding of compensation strategies for sensory deficts for incr safety.    Time 4    Period Weeks    Status Achieved   Pt verbalized that he has to be careful about temperature, needs additional reinforcement.  03/12/21:  met     OT SHORT TERM GOAL #6   Title Pt will improve R hand grip stength to at least 32lbs to assist with ADLs/opening containers.    Time 4    Period Weeks    Status Achieved   33.5     OT SHORT TERM GOAL #7   Title --               OT Long Term Goals - 03/12/21 0904       OT LONG TERM GOAL #1   Title Pt will be independent  with updated HEP--    Time 12    Period Weeks    Status On-going      OT LONG TERM GOAL #2   Title  Pt will improve L hand coordination for ADLs as shown by completing 9-hole peg test in less than 70sec.    Baseline 107sec    Time 12    Period Weeks    Status On-going   03/12/21:  78sec, 78sec (with multiple drops)     OT LONG TERM GOAL #3   Title Pt will be mod I with toileting.    Time 12    Period Weeks    Status On-going   03/12/21:  supervision/CGA     OT LONG TERM GOAL #4   Title Pt will perform simple snack prep/home maintenance tasks in standing with supervision.    Time 12    Period Weeks    Status On-going      OT LONG TERM GOAL #5   Title Pt will demo good safety awareness/compensation for sensory deficits and attention to LUE during functional tasks and transfers without cueing.    Time 12    Period Weeks    Status On-going      OT LONG TERM GOAL #6   Title Pt will perform simple environmental scanning/navigation with at least 90% accuracy for incr safety.    Time 12    Period Weeks    Status On-going   03/12/21:  70% with CGA for balance/ambulation     OT LONG TERM GOAL #7   Title Pt will perform all basic ADLS with no more than supervision.    Time 12    Period Weeks    Status On-going   03/12/21:  min A-supervision                  Plan - 03/27/21 0931     Clinical Impression Statement Pt is progressing towards goals with improved coordination and functional mobility; however, pt needs minmod cueing for safety and RW negotiation/placement particularly when reaching with LUE pt tends to not step close enough with LLE when reaching with LUE.    OT Occupational Profile and History Detailed Assessment- Review of Records and additional review of physical, cognitive, psychosocial history related to current functional performance    Occupational performance deficits (Please refer to evaluation for details): ADL's;IADL's;Work;Leisure;Social Participation     Body Structure / Function / Physical Skills ADL;Strength;Balance;Proprioception;UE functional use;IADL;Endurance;Vision;Mobility;Coordination;Decreased knowledge of precautions;FMC;GMC;Sensation    Cognitive Skills Attention;Safety Awareness;Memory;Perception;Problem Solve    Rehab Potential Good    Clinical Decision Making Several treatment options, min-mod task modification necessary    Comorbidities Affecting Occupational Performance: May have comorbidities impacting occupational performance    Modification or Assistance to Complete Evaluation  Min-Moderate modification of tasks or assist with assess necessary to complete eval    OT Frequency 3x / week   updated frequency may decrease after 4 weeks   OT Duration 12 weeks   written for 12 weeks however may d/c after 8 depenent upon progress.   OT Treatment/Interventions Self-care/ADL training;Moist Heat;Fluidtherapy;DME and/or AE instruction;Balance training;Therapeutic activities;Aquatic Therapy;Ultrasound;Therapeutic exercise;Cognitive remediation/compensation;Visual/perceptual remediation/compensation;Functional Mobility Training;Neuromuscular education;Cryotherapy;Energy conservation;Manual Therapy;Patient/family education    Plan continue with UE control, standing balance/functional mobility functional reaching, RW placement with functional reach    Consulted and Agree with Plan of Care Family member/caregiver;Patient    Family Member Consulted wife             Patient will benefit from skilled therapeutic intervention in order to improve the following deficits and impairments:   Body Structure / Function / Physical Skills: ADL, Strength, Balance, Proprioception, UE functional use, IADL, Endurance, Vision, Mobility, Coordination,  Decreased knowledge of precautions, [place], [place], Sensation Cognitive Skills: Attention, Safety Awareness, Memory, Perception, Problem Solve     Visit Diagnosis: Other lack of coordination  Frontal lobe and  executive function deficit  Muscle weakness (generalized)  Unsteadiness on feet  Ataxia  Other disturbances of skin sensation  Other symptoms and signs involving cognitive functions following other nontraumatic intracranial hemorrhage  Visuospatial deficit    Problem List Patient Active Problem List   Diagnosis Date Noted   Thrombosis    Sleep disturbance    Dysphagia, post-stroke    PAF (paroxysmal atrial fibrillation) ([place])    Acute pulmonary embolism without acute cor pulmonale (HCC)    Malnutrition of moderate degree 11/04/2020   Tracheostomy care ([place])    Pressure injury of skin 10/23/2020   Intracranial hemorrhage (HCC)    Atrial fibrillation ([place])    Prediabetes    Leukocytosis    Acute blood loss anemia    Hypernatremia    ICH (intracerebral hemorrhage) ([place]) 09/25/2020   Unilateral primary osteoarthritis, left knee 09/26/2016   Atrial flutter ([place]) 09/25/2013   Near syncope 09/25/2013    [facility], OT/L 03/27/2021, 10:09 AM  [facility] [address] [place], [place], [address] Phone: [phone]   Fax:  [phone]  Name: Walter [name] MRN: [id number] Date of Birth: 1955-08-31  Vianne Bulls, OTR/L [facility] [address] [place], [place]  [address] [phone] phone [phone] 03/27/21 10:09 AM

## 2021-03-29 ENCOUNTER — Other Ambulatory Visit: Payer: Self-pay

## 2021-03-29 ENCOUNTER — Ambulatory Visit: Payer: Medicare Other

## 2021-03-29 ENCOUNTER — Encounter: Payer: Self-pay | Admitting: Occupational Therapy

## 2021-03-29 ENCOUNTER — Ambulatory Visit: Payer: Medicare Other | Admitting: Occupational Therapy

## 2021-03-29 DIAGNOSIS — R2681 Unsteadiness on feet: Secondary | ICD-10-CM

## 2021-03-29 DIAGNOSIS — R278 Other lack of coordination: Secondary | ICD-10-CM

## 2021-03-29 DIAGNOSIS — R208 Other disturbances of skin sensation: Secondary | ICD-10-CM

## 2021-03-29 DIAGNOSIS — R41841 Cognitive communication deficit: Secondary | ICD-10-CM

## 2021-03-29 DIAGNOSIS — I69218 Other symptoms and signs involving cognitive functions following other nontraumatic intracranial hemorrhage: Secondary | ICD-10-CM

## 2021-03-29 DIAGNOSIS — R270 Ataxia, unspecified: Secondary | ICD-10-CM

## 2021-03-29 DIAGNOSIS — M6281 Muscle weakness (generalized): Secondary | ICD-10-CM

## 2021-03-29 DIAGNOSIS — R41842 Visuospatial deficit: Secondary | ICD-10-CM

## 2021-03-29 DIAGNOSIS — R471 Dysarthria and anarthria: Secondary | ICD-10-CM

## 2021-03-29 DIAGNOSIS — R41844 Frontal lobe and executive function deficit: Secondary | ICD-10-CM

## 2021-03-29 NOTE — Therapy (Signed)
[facility] [address] [place], [place], [address] Phone: [phone]   Fax:  [phone]  Occupational Therapy Treatment  Patient Details  Name: Walter [name] MRN: [id number] Date of Birth: 20-Sep-1955 Referring Provider (OT): Dr. Alysia Penna   Encounter Date: 03/29/2021   OT End of Session - 03/29/21 0941     Visit Number 25    Number of Visits 73    Date for OT Re-Evaluation 04/27/21    Authorization Type Medicare Part A & B- need to add [place] - Visit Number 25    Authorization - Number of Visits 30    Progress Note Due on Visit 6    OT Start Time [phone]    OT Stop Time 1016    OT Time Calculation (min) 38 min    Activity Tolerance Patient tolerated treatment well    Behavior During Therapy Impulsive             Past Medical History:  Diagnosis Date   Atrial flutter ([place])    s/p ablation   DVT (deep venous thrombosis) ([place])    Near syncope 09/25/2013   Paroxysmal atrial fibrillation ([place])    Stroke [facility])    initially hemorragic (not on [place]) followed by subsequent embolic stroke    Past Surgical History:  Procedure Laterality Date   A-FLUTTER ABLATION N/A 03/09/2020   Procedure: A-FLUTTER ABLATION;  Surgeon: Thompson Grayer, MD;  Location: [facility] CV LAB;  Service: Cardiovascular;  Laterality: N/A;   CARDIOVERSION N/A 09/27/2013   Procedure: CARDIOVERSION;  Surgeon: Lelon Perla, MD;  Location: [facility] ENDOSCOPY;  Service: Cardiovascular;  Laterality: N/A;   IR ANGIO EXTERNAL CAROTID SEL EXT CAROTID UNI R MOD SED  09/26/2020   IR ANGIO INTRA EXTRACRAN SEL COM CAROTID INNOMINATE BILAT MOD SED  09/26/2020   IR ANGIO VERTEBRAL SEL VERTEBRAL UNI R MOD SED  09/26/2020   IR IVC FILTER PLMT / S&I /IMG GUID/MOD SED  10/08/2020   IR IVUS EACH ADDITIONAL NON CORONARY VESSEL  11/02/2020   IR PTA VENOUS EXCEPT DIALYSIS CIRCUIT  11/02/2020   IR RADIOLOGIST EVAL & MGMT  01/17/2021   IR THROMBECT VENO  MECH MOD SED  10/27/2020   IR THROMBECT VENO MECH MOD SED  11/02/2020   IR US GUIDE VASC ACCESS LEFT  10/27/2020   IR US GUIDE VASC ACCESS RIGHT  09/26/2020   IR US GUIDE VASC ACCESS RIGHT  11/02/2020   IR VENO/EXT/UNI RIGHT  11/02/2020   IR VENOCAVAGRAM IVC  10/27/2020   RETINAL DETACHMENT SURGERY     right knee arthroscopy     TEE WITHOUT CARDIOVERSION N/A 09/27/2013   Procedure: TRANSESOPHAGEAL ECHOCARDIOGRAM (TEE);  Surgeon: Lelon Perla, MD;  Location: [facility] ENDOSCOPY;  Service: Cardiovascular;  Laterality: N/A;   WRIST SURGERY      There were no vitals filed for this visit.   Subjective Assessment - 03/29/21 0937     Subjective  doing good.  Pt reports some R achilles pain today    Patient is accompanied by: --   aide/caregiver   Pertinent History ICH of brainstem (complicated by hydrocephalus, DVTs, and tracheostomy).  PMH:  a-fib    Limitations fall risk, ataxia, impulsivity    Patient Stated Goals use L side of body better (decrease ataxia), stay active and heal brain    Currently in Pain? Yes    Pain Score 1     Pain Location Foot  Pain Orientation Right    Pain Descriptors / Indicators Aching    Pain Type Acute pain    Pain Onset In the past 7 days    Pain Frequency Intermittent    Aggravating Factors  walking    Pain Relieving Factors rest                Arm bike x5 min level 6 for conditioning and reciprocal movement with min cueing for control.  Sitting, performing functional reaching with LUE to place washers on vertical poles with min cueing for midline postural/trunk alignment and to stabilize LUE proximally.     Standing and ambulating with RW with walker tray for simple environmental scanning/negotiation and reaching to retrieve objects from various locations with min-mod cueing and CGA for safety/RW placement.  Pt demo improved balance/positioning when reaching/retrieving object with RUE, but continues to need cueing to get closer to object/step forward with  LLE when reaching with LUE/on L side.  Pt continues to have some impulsivity (but improved) and did best with anticipatory cueing vs cueing to correct.  Cautioned pt to move slowly and not to retrieve objects from floor at this time.  Sitting, closed-chain shoulder flex and chest press with scapular retraction with BUEs with min cueing for slow, controlled movements.  Lining up blocks with both hands simultaneously with emphasis/cueing for timing and coordination with min/mod difficulty.         OT Short Term Goals - 03/12/21 0859       OT SHORT TERM GOAL #1   Title Pt/caregiver will be independent with initial HEP for LUE coordination and R hand strength.--    Time 4    Period Weeks    Status Achieved   needs continued reinforcement, pt is not using consistently   Target Date 03/02/21      OT SHORT TERM GOAL #2   Title Pt/caregiver will be independent with vision HEP and visual compensation strategies.    Time 4    Period Weeks    Status Achieved   Pt/ family verbalize understanding of vision HEP, needs continued reinforcement of compensatory strategeis- pt is not performing head turns     OT SHORT TERM GOAL #3   Title Pt will improve LUE coordination for ADLs as shown by improving score on box and blocks test by at least 6.    Baseline L-21 blocks    Time 4    Period Weeks    Status On-going   25 blocks.  03/12/21:  24 blocks     OT SHORT TERM GOAL #4   Title Pt will perform simple snack prep/home maintenance task from w/c level mod I.    Time 4    Period Weeks    Status Achieved   met per pt's wife     OT SHORT TERM GOAL #5   Title Pt will verbalize understanding of compensation strategies for sensory deficts for incr safety.    Time 4    Period Weeks    Status Achieved   Pt verbalized that he has to be careful about temperature, needs additional reinforcement.  03/12/21:  met     OT SHORT TERM GOAL #6   Title Pt will improve R hand grip stength to at least 32lbs to  assist with ADLs/opening containers.    Time 4    Period Weeks    Status Achieved   33.5     OT SHORT TERM GOAL #7   Title --  OT Long Term Goals - 03/12/21 0904       OT LONG TERM GOAL #1   Title Pt will be independent with updated HEP--    Time 12    Period Weeks    Status On-going      OT LONG TERM GOAL #2   Title Pt will improve L hand coordination for ADLs as shown by completing 9-hole peg test in less than 70sec.    Baseline 107sec    Time 12    Period Weeks    Status On-going   03/12/21:  78sec, 78sec (with multiple drops)     OT LONG TERM GOAL #3   Title Pt will be mod I with toileting.    Time 12    Period Weeks    Status On-going   03/12/21:  supervision/CGA     OT LONG TERM GOAL #4   Title Pt will perform simple snack prep/home maintenance tasks in standing with supervision.    Time 12    Period Weeks    Status On-going      OT LONG TERM GOAL #5   Title Pt will demo good safety awareness/compensation for sensory deficits and attention to LUE during functional tasks and transfers without cueing.    Time 12    Period Weeks    Status On-going      OT LONG TERM GOAL #6   Title Pt will perform simple environmental scanning/navigation with at least 90% accuracy for incr safety.    Time 12    Period Weeks    Status On-going   03/12/21:  70% with CGA for balance/ambulation     OT LONG TERM GOAL #7   Title Pt will perform all basic ADLS with no more than supervision.    Time 12    Period Weeks    Status On-going   03/12/21:  min A-supervision                  Plan - 03/29/21 0942     Clinical Impression Statement Pt is progressing with improving coordination, but continues to need cueing for functional mobility and L side awareness.    OT Occupational Profile and History Detailed Assessment- Review of Records and additional review of physical, cognitive, psychosocial history related to current functional performance     Occupational performance deficits (Please refer to evaluation for details): ADL's;IADL's;Work;Leisure;Social Participation    Body Structure / Function / Physical Skills ADL;Strength;Balance;Proprioception;UE functional use;IADL;Endurance;Vision;Mobility;Coordination;Decreased knowledge of precautions;FMC;GMC;Sensation    Cognitive Skills Attention;Safety Awareness;Memory;Perception;Problem Solve    Rehab Potential Good    Clinical Decision Making Several treatment options, min-mod task modification necessary    Comorbidities Affecting Occupational Performance: May have comorbidities impacting occupational performance    Modification or Assistance to Complete Evaluation  Min-Moderate modification of tasks or assist with assess necessary to complete eval    OT Frequency 3x / week   updated frequency may decrease after 4 weeks   OT Duration 12 weeks   written for 12 weeks however may d/c after 8 depenent upon progress.   OT Treatment/Interventions Self-care/ADL training;Moist Heat;Fluidtherapy;DME and/or AE instruction;Balance training;Therapeutic activities;Aquatic Therapy;Ultrasound;Therapeutic exercise;Cognitive remediation/compensation;Visual/perceptual remediation/compensation;Functional Mobility Training;Neuromuscular education;Cryotherapy;Energy conservation;Manual Therapy;Patient/family education    Plan continue with UE control, standing balance/functional mobility functional reaching, RW placement with functional reach    Consulted and Agree with Plan of Care Family member/caregiver;Patient    Family Member Consulted wife             Patient will  benefit from skilled therapeutic intervention in order to improve the following deficits and impairments:   Body Structure / Function / Physical Skills: ADL, Strength, Balance, Proprioception, UE functional use, IADL, Endurance, Vision, Mobility, Coordination, Decreased knowledge of precautions, [place], GMC, Sensation Cognitive Skills: Attention,  Safety Awareness, Memory, Perception, Problem Solve     Visit Diagnosis: Ataxia  Other lack of coordination  Unsteadiness on feet  Muscle weakness (generalized)  Frontal lobe and executive function deficit  Other disturbances of skin sensation  Other symptoms and signs involving cognitive functions following other nontraumatic intracranial hemorrhage  Visuospatial deficit    Problem List Patient Active Problem List   Diagnosis Date Noted   Thrombosis    Sleep disturbance    Dysphagia, post-stroke    PAF (paroxysmal atrial fibrillation) (HCC)    Acute pulmonary embolism without acute cor pulmonale (HCC)    Malnutrition of moderate degree 11/04/2020   Tracheostomy care ([place])    Pressure injury of skin 10/23/2020   Intracranial hemorrhage (HCC)    Atrial fibrillation (HCC)    Prediabetes    Leukocytosis    Acute blood loss anemia    Hypernatremia    ICH (intracerebral hemorrhage) ([place]) 09/25/2020   Unilateral primary osteoarthritis, left knee 09/26/2016   Atrial flutter ([place]) 09/25/2013   Near syncope 09/25/2013    [facility], OT/L 03/29/2021, 4:09 PM  [facility] [address] [place], [place], [address] Phone: [phone]   Fax:  [phone]  Name: Walter [name] MRN: [id number] Date of Birth: 03-21-1956  Vianne Bulls, OTR/L [facility] [address] [place], [place]  [address] [phone] phone [phone] 03/29/21 4:09 PM

## 2021-03-29 NOTE — Therapy (Signed)
OUTPATIENT PHYSICAL THERAPY TREATMENT NOTE Patient Name: Walter [name] MRN: [id number] DOB:09/14/1955, 65 y.o., male Today's Date: 03/29/2021  PCP: Alroy Dust, L.Marlou Sa, MD REFERRING PROVIDER: Alroy Dust, Carlean Jews.Marlou Sa, MD   PT End of Session - 03/29/21 1003     Visit Number 28    Number of Visits 44    Date for PT Re-Evaluation 05/04/21    Authorization Type Medicare 12/26/20 to 02/20/21; Recert 7/86/75 to 44/92/01; 00F PN/recert on 07/02/17 to 75/8/83    Authorization Time Period 03/09/21-05/04/21    Progress Note Due on Visit 64    PT Start Time 1015    PT Stop Time 1100    PT Time Calculation (min) 45 min    Behavior During Therapy Impulsive                   Past Medical History:  Diagnosis Date   Atrial flutter (HCC)    s/p ablation   DVT (deep venous thrombosis) (HCC)    Near syncope 09/25/2013   Paroxysmal atrial fibrillation (HCC)    Stroke [facility])    initially hemorragic (not on [place]) followed by subsequent embolic stroke   Past Surgical History:  Procedure Laterality Date   A-FLUTTER ABLATION N/A 03/09/2020   Procedure: A-FLUTTER ABLATION;  Surgeon: Thompson Grayer, MD;  Location: [place] [facility];  Service: Cardiovascular;  Laterality: N/A;   CARDIOVERSION N/A 09/27/2013   Procedure: CARDIOVERSION;  Surgeon: Lelon Perla, MD;  Location: [facility] ENDOSCOPY;  Service: Cardiovascular;  Laterality: N/A;   IR ANGIO EXTERNAL CAROTID SEL EXT CAROTID UNI R MOD SED  09/26/2020   IR ANGIO INTRA EXTRACRAN SEL COM CAROTID INNOMINATE BILAT MOD SED  09/26/2020   IR ANGIO VERTEBRAL SEL VERTEBRAL UNI R MOD SED  09/26/2020   IR IVC FILTER PLMT / S&I /IMG GUID/MOD SED  10/08/2020   IR IVUS EACH ADDITIONAL NON CORONARY VESSEL  11/02/2020   IR PTA VENOUS EXCEPT DIALYSIS CIRCUIT  11/02/2020   IR RADIOLOGIST EVAL & MGMT  01/17/2021   IR THROMBECT VENO MECH MOD SED  10/27/2020   IR THROMBECT VENO MECH MOD SED  11/02/2020   IR US GUIDE VASC ACCESS LEFT  10/27/2020   IR US GUIDE VASC ACCESS RIGHT  09/26/2020    IR US GUIDE VASC ACCESS RIGHT  11/02/2020   IR VENO/EXT/UNI RIGHT  11/02/2020   IR VENOCAVAGRAM IVC  10/27/2020   RETINAL DETACHMENT SURGERY     right knee arthroscopy     TEE WITHOUT CARDIOVERSION N/A 09/27/2013   Procedure: TRANSESOPHAGEAL ECHOCARDIOGRAM (TEE);  Surgeon: Lelon Perla, MD;  Location: [facility] ENDOSCOPY;  Service: Cardiovascular;  Laterality: N/A;   WRIST SURGERY     Patient Active Problem List   Diagnosis Date Noted   Thrombosis    Sleep disturbance    Dysphagia, post-stroke    PAF (paroxysmal atrial fibrillation) ([place])    Acute pulmonary embolism without acute cor pulmonale (HCC)    Malnutrition of moderate degree 11/04/2020   Tracheostomy care ([place])    Pressure injury of skin 10/23/2020   Intracranial hemorrhage (HCC)    Atrial fibrillation (HCC)    Prediabetes    Leukocytosis    Acute blood loss anemia    Hypernatremia    ICH (intracerebral hemorrhage) ([place]) 09/25/2020   Unilateral primary osteoarthritis, left knee 09/26/2016   Atrial flutter ([place]) 09/25/2013   Near syncope 09/25/2013    REFERRING DIAG: I61.3 (ICD-10-CM) - 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 ([place]) I69.193 (ICD-10-CM) - 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  PERTINENT HISTORY: atrial flutter s/p ablation, DVT, near syncope, CVA  PRECAUTIONS: fall  SUBJECTIVE: Reporting some R achilles tendon discomfort following last session, requests to refrain from walking too much  TODAY'S TREATMENT:  03/29/21  Seated on green disk, forward reaching to floor wih 2KG ball followed by [facility] reaching ea. For 20 reps, single movement Seated trunk flexion/extension over sitting disk, extension blocked by bolster, 15x.  Seated B rotation holding 2KG ball 20x, hip to hip  Seated LAQs on disk, 3# wts, with lat press alternating pattern, 2x15  Seated marching on disk, 3# wts with lat press alternating pattern  Supine SLR circled CW/CCW against  red band 1 min duration ea. LE  Scifit seat 20 arms 8 L4 x104mn     03/27/21  Seated on green disk, forward reaching to floor wih 3KG ball [facility] by [facility]. For 15 reps Seated trunk flexion/extension over sitting disk, extension blocked by bolster, 15x.  Seated B rotation holding 3KG ball 15x, hip to hip  Ambulation with QC 34f SPC 3020fhen SPCHoly Rosary Healthcareth 3# wt [id number] Min/ModA for balance corrections  From green disk, STS 15x with 3KG ball   Seated on green disk, OH press with 3KG ball      03/22/21  Completed gait training x 230 ft with RW, PT facilitating at pelvis for improved weight shift and cues for controlled stepping to promote stability. Then progressed to gait training with negotiation around obstacles (cones) x 3 laps, cues to stay aligned within walker, most challenge noted with sharp turns. CGA throughout. Continued cues for improved turn and alignment with surface prior to descent with sit <> stand.   Seated on green air disc without UE support, completed lateral weight shift to R/L x 15 reps working on control and cues for posture/core activation. Then progressed to weight shift with reaching for cone in various directions x 10 reps.   Tall Kneeling on Mat with BUE support on Bench positioned in front of patient. CGA to get into/out of position. In tall kneeling completed hip hinges 2 x 10 reps working on improved return to upright and maintian hip extension, cues to keep posture upright. Then completed alternating UE raises x 10 reps bilaterally. Lateral reaching to PT's hand x 5 reps bilateral directions. Then trialed no UE support, difficulty maintaining tall kneeling position without assistance.    03/20/21 Seated trunk flexion/extension holding 5# ball 15X  Seated SB holding 5# ball 15x with PT stabilization dropping to each elbow  STS 15x with 5# ball SLR stabilization against blue t-band 60s duration x2 ea. LE DKTC over red physioball 15x   LTR 15x over red  physioball  Scifit seat 20 arms 7 L2 x8min27m9/14/22  Seated marching, alt, 6# 15x per LE with latissimus press while seated on green disk to challenge seated balance  Seated LAQs 6# alt. 15x per LE with latissimus press while seated on green disk to challenge seated balance   STS with OH punch using yellow t-band 10x  SLR circles against red band, 60s each leg in supine position  Ambulation of 200ft 8fide of clinic with RW with CGA to accommodate facility fire drill  Supine/sit transitions 5x to ea. Side with random stopping points  Quadriped on elbows WS forward and back 10x  03/12/21 Ambulation of 460ft [id number] and CGA, cued to pause walking before pivot turning to focus  on task at hand and break down tasks into individual  components to control ataxia Seated on green disk, alternating marching and LAQs with lat press to activate core, 3# ankle weights, alternating pattern, 15 reps per LE Seated on disk, forward reaching using RW 10x              Seated core using disk and 3.3# ball using hip/shoulder tosses, chops and Vs for 10 reps per task  STS with OH reach once standing, 10 reps 03/09/21 Gait training: standing up from standard chair with large based quad cane walking up around cone (10 feet away) and sitting back down in chair: 2x, cues for 3step pattern and sequencing, min A to mod A required 1 x 230 feet with RW, CGA to min A with cues for smaller steps BBS performed 5x sit to stand performed.   03/01/2021 Pt seen for aquatic therapy today.  Treatment took place in water 3.25-4.8 ft in depth at the [facility] pool. Temp of water was 92. entered/exited the pool via stairs step to pattern independently with bilat rail.  Introduced pt to aquatic setting.                           Amb using water walker. forward, backward and side stepping/walking cues for increased step length, posture and weight shifting x 12 minutes. Pt requiring mod to min asst. Multiple  LOB.  Seated -sit to stand transfers from water bench submerged to waist when standing.  Cueing for weight shift/execution. Initally completed with water walker, progressed to thick yellow noodle.  Pt requiring min-cga for completion. 2 x 5 Marching x 10 LAQ x 10.  Standing -step ups x leading with L/R LE.  Cueing for decreased impulsiveness /improved concentration on finishing first movement before moving to next. -toe raises 2 x 10 Cueing for balance/slowing movements  Supine suspension Flutter kicking x 5 min supported by Pt  Pt requires buoyancy for support and to offload joints with strengthening exercises. Viscosity of the water is needed for resistance of strengthening; water current perturbations provides challenge to standing balance unsupported, requiring increased core activation.   02/28/21: Gait training: 1 x 460 feet with tactile cues at pelvis to guide weight shifts cues for slower cadence and turning slowly Standing on floor: OH reaching and fwd reaching: alternating between reaches: 10x each Standing touching toes: 5x Gait training: chair to chair transfer with 180 deg turn: quad cane, CGA to min A: 4x  02/26/21: Gait training: 1 x 420 feet with tactile cues at pelvis to guide weight shifts cues for slower cadence and turning slowly Sit to stand: 10x no HHA Standing at countertop with wide BOS: moving basket with 20lbs from side to side to increase core engagement and weight shifts Picking up basket with 20lb from counter to chest: 5x Working on ankle strategy: standing heel and toe raises without HHA: very close guarding: 2 x 10  Standing contralateral reaching: 10x R and L    PATIENT EDUCATION: Education details: Scheduling more PT visits for October Person educated: Patient and caregiver Education method: Consulting civil engineer, Media planner, and Verbal cues Education comprehension: verbalized understanding     HOME EXERCISE PROGRAM: Continue daily walking with CG  assist  ASSESSMENT:   CLINICAL IMPRESSION:  Decreased weight with core tasks but increased reps as noted to focus on control and balance as well as core stabilization.  Scifit in place of gait training due to achilles  discomfort, will be in aquatic therapy tomorrow.  Observed to demo more fluid motions today as evidenced by ability to stabilize trunk on sitting disk.  STS transfers under SBA today but not emphasized due to R heelcord strain  REHAB POTENTIAL: Good   CLINICAL DECISION MAKING: Stable/uncomplicated  GOALS: Goals reviewed with patient? Yes   SHORT TERM GOALS:   STG Name Target Date Goal status  1 Patient will be able to perform chair to chair transfer with CGA/SBA with use of RW for safety to improve independence. Baseline: min to mod A (12/26/20); CGA/minA (03/09/21) 01/23/2021 03/27/21 Able to transfer STS and stand pivot with SBA, goal met  2 Pt will be able to ambulate 115' with RW with CGA to min A with RW for safety to improve independence Baseline: min to mod A with RW 145'; 230 feet with RW and CGA to MIN A 01/23/2021 03/09/21 Goal met  3 Pt will be able to maintain standing balance for 30 sec to improve static balance Baseline: 10 sec before needing help due to LOB (12/26/20) 01/23/2021 02/05/21 able to stand with S for 30s, goal met  4. Pt will be able to perform sit to stand transfers with SBA to improve independence 04/06/21 Goal met 03/27/21  5. Pt will be able to perform 230 feet of walking with RW with SBA/CGA to improve household ambulation  04/06/21 Revised 03/09/21    LONG TERM GOALS:   LTG Name Target Date Goal status  1 Pt will demo >5 points improvement on Berg Balance Scale to improve static balance Baseline: 11/56 01/03/21 02/20/2021 INITIAL 03/09/21 BBS 21/56  2 Pt will be able to ambulate >500 feet with RW and SBA to improve walking endurance and safety. Baseline: 145' with RW min to mod A; 230 feet with CGA to Min A (03/09/21)  05/18/21 Progressing continue   3 Pt  will be able to perform gait speed improvement by 0.17ms to improve community ambulation. Baseline:0.246m (03/09/21) 05/16/21 Progressing continue  4 Pt will demo 5 sec improvement in 5x sit to stand (with or without UE support) to improve functional strength Baseline: 5x sit to stand with bil UE 17 sec;  05/18/21 Progressing continue    PLAN: PT FREQUENCY: 2-3x/week   PT DURATION: 8 weeks   PLANNED INTERVENTIONS: Aquatic therapy, Therapeutic exercises, Therapeutic activity, Neuro Muscular re-education, Balance training, Gait training, Patient/Family education, Joint mobilization, Stair training, Visual/preceptual remediation/compensation, Wheelchair mobility training, Cryotherapy, Moist heat, and Manual therapy   PLAN FOR NEXT SESSION:  Balance challenges progressed with seated core strengthening and tall kneel tasks as knees tolerate Standing/walking tasks with weighted cane, floor targets and stepping tasks, higher level gait training-stepping over and around obstacles, resisted walking, seated tasks on disk, f/u on achilles pain  Visit Diagnosis: No diagnosis found.     Problem List Patient Active Problem List   Diagnosis Date Noted   Thrombosis    Sleep disturbance    Dysphagia, post-stroke    PAF (paroxysmal atrial fibrillation) (HCC)    Acute pulmonary embolism without acute cor pulmonale (HCC)    Malnutrition of moderate degree 11/04/2020   [place]   Pressure injury of skin 10/23/2020   Intracranial hemorrhage (HCC)    Atrial fibrillation (HCC)    Prediabetes    Leukocytosis    Acute blood loss anemia    Hypernatremia    [place]) [place]/28[place]20[place]   Unilateral primary osteoarthritis, left knee 09/26/2016   [place]  Near syncope 09/25/2013    Lanice Shirts, PT 03/29/2021, 10:12 AM  [facility] [address] [place], [place],  [address] Phone: [phone]   Fax:  [phone]

## 2021-03-30 ENCOUNTER — Encounter (HOSPITAL_BASED_OUTPATIENT_CLINIC_OR_DEPARTMENT_OTHER): Payer: Self-pay | Admitting: Physical Therapy

## 2021-03-30 ENCOUNTER — Ambulatory Visit (HOSPITAL_BASED_OUTPATIENT_CLINIC_OR_DEPARTMENT_OTHER): Payer: Medicare Other | Admitting: Physical Therapy

## 2021-03-30 DIAGNOSIS — I639 Cerebral infarction, unspecified: Secondary | ICD-10-CM

## 2021-03-30 DIAGNOSIS — R270 Ataxia, unspecified: Secondary | ICD-10-CM | POA: Diagnosis not present

## 2021-03-30 DIAGNOSIS — M6281 Muscle weakness (generalized): Secondary | ICD-10-CM

## 2021-03-30 DIAGNOSIS — R2681 Unsteadiness on feet: Secondary | ICD-10-CM

## 2021-03-30 DIAGNOSIS — R278 Other lack of coordination: Secondary | ICD-10-CM

## 2021-03-30 NOTE — Therapy (Signed)
OUTPATIENT PHYSICAL THERAPY TREATMENT NOTE Patient Name: Walter [name] MRN: [id number] DOB:Apr 21, 1956, 65 y.o., male Today's Date: 03/30/2021  PCP: Alroy Dust, L.Marlou Sa, MD REFERRING PROVIDER: Alroy Dust, Carlean Jews.Marlou Sa, MD   PT End of Session - 03/30/21 1118     Visit Number 29    Number of Visits 60    Date for PT Re-Evaluation 05/04/21    Authorization Type Medicare 12/26/20 to 02/20/21; Recert 4/82/70 to 78/67/54; 49E PN/recert on 0/1/00 to 71/2/19    Authorization Time Period 03/09/21-05/04/21    Progress Note Due on Visit 30    PT Start Time 1118    PT Stop Time 1202    PT Time Calculation (min) 44 min    Equipment Utilized During Treatment Gait belt    Activity Tolerance Patient tolerated treatment well    Behavior During Therapy Impulsive                   Past Medical History:  Diagnosis Date   Atrial flutter ([place])    s/p ablation   DVT (deep venous thrombosis) (HCC)    Near syncope 09/25/2013   Paroxysmal atrial fibrillation (HCC)    Stroke [facility])    initially hemorragic (not on [place]) followed by subsequent embolic stroke   Past Surgical History:  Procedure Laterality Date   A-FLUTTER ABLATION N/A 03/09/2020   Procedure: A-FLUTTER ABLATION;  Surgeon: Thompson Grayer, MD;  Location: [place] CV LAB;  Service: Cardiovascular;  Laterality: N/A;   CARDIOVERSION N/A 09/27/2013   Procedure: CARDIOVERSION;  Surgeon: Lelon Perla, MD;  Location: [facility] ENDOSCOPY;  Service: Cardiovascular;  Laterality: N/A;   IR ANGIO EXTERNAL CAROTID SEL EXT CAROTID UNI R MOD SED  09/26/2020   IR ANGIO INTRA EXTRACRAN SEL COM CAROTID INNOMINATE BILAT MOD SED  09/26/2020   IR ANGIO VERTEBRAL SEL VERTEBRAL UNI R MOD SED  09/26/2020   IR IVC FILTER PLMT / S&I /IMG GUID/MOD SED  10/08/2020   IR IVUS EACH ADDITIONAL NON CORONARY VESSEL  11/02/2020   IR PTA VENOUS EXCEPT DIALYSIS CIRCUIT  11/02/2020   IR RADIOLOGIST EVAL & MGMT  01/17/2021   IR THROMBECT VENO MECH MOD SED  10/27/2020   IR THROMBECT VENO  MECH MOD SED  11/02/2020   IR US GUIDE VASC ACCESS LEFT  10/27/2020   IR US GUIDE VASC ACCESS RIGHT  09/26/2020   IR US GUIDE VASC ACCESS RIGHT  11/02/2020   IR VENO/EXT/UNI RIGHT  11/02/2020   IR VENOCAVAGRAM IVC  10/27/2020   RETINAL DETACHMENT SURGERY     right knee arthroscopy     TEE WITHOUT CARDIOVERSION N/A 09/27/2013   Procedure: TRANSESOPHAGEAL ECHOCARDIOGRAM (TEE);  Surgeon: Lelon Perla, MD;  Location: [facility] ENDOSCOPY;  Service: Cardiovascular;  Laterality: N/A;   WRIST SURGERY     Patient Active Problem List   Diagnosis Date Noted   Thrombosis    Sleep disturbance    Dysphagia, post-stroke    PAF (paroxysmal atrial fibrillation) ([place])    Acute pulmonary embolism without acute cor pulmonale (HCC)    Malnutrition of moderate degree 11/04/2020   Tracheostomy care ([place])    Pressure injury of skin 10/23/2020   Intracranial hemorrhage (HCC)    Atrial fibrillation ([place])    Prediabetes    Leukocytosis    Acute blood loss anemia    Hypernatremia    ICH (intracerebral hemorrhage) ([place]) 09/25/2020   Unilateral primary osteoarthritis, left knee 09/26/2016   Atrial flutter ([place]) 09/25/2013   Near syncope 09/25/2013  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 Have MD (friend) look at it tonight.   TODAY'S TREATMENT:   03/30/21  Pt seen for aquatic therapy today.  Treatment took place in water 3.25-4.8 ft in depth at the [facility] pool. Temp of water was 92.Pt entered/exited the pool via stairs step to pattern  independently with bilat rail.                          Amb progressed to yellow noodle forward, backward (posterior chain activation) and side stepping/walking cues for increased step length, posture and weight shifting x 4 lengths of pool.  Standing -step ups x 10 leading with L/R LE holding to wall for support -side stepping over water step R/L with bilat ue support decreasing to unilateral x 10.  Cues for lle control and proper placement, weight shifting and posture for improved balance control -hip add/abd and extension x 12 reps holding to pool wall. VC for increases speed to increase resistance.   Supine suspension Standing<>supine using 2 foam hand buoys x5 reps holding position x 10 second each -standing<>prone x5 holding position x 10 seconds. Flutter kicking x 5 min supported by Pt  Pt requires buoyancy for support and to offload joints with strengthening exercises. Viscosity of the water is needed for resistance of strengthening; water current perturbations provides challenge to standing balance unsupported, requiring increased core activation.     03/29/21  Seated on green disk, forward reaching to floor wih 2KG ball followed by [facility] reaching ea. For 20 reps, single movement Seated trunk flexion/extension over sitting disk, extension blocked by bolster, 15x.  Seated B rotation holding 2KG ball 20x, hip to hip  Seated LAQs on disk, 3# wts, with lat press alternating pattern, 2x15  Seated marching on disk, 3# wts with lat press alternating pattern  Supine SLR circled CW/CCW against red band 1 min duration ea. LE  Scifit seat 20 arms 8 L4 x62mn     03/27/21  Seated on green disk, forward reaching to floor wih 3KG ball [facility]. For 15 reps Seated trunk flexion/extension over sitting disk, extension blocked by bolster, 15x.  Seated B rotation holding 3KG ball 15x, hip to hip  Ambulation with QC 366f SPC [id number] [facility] 3# wt [id number] Min/ModA for  balance corrections  From green disk, STS 15x with 3KG ball   Seated on green disk, OH press with 3KG ball      03/22/21  Completed gait training x 230 ft with RW, PT facilitating at pelvis for improved weight shift and cues for controlled stepping to promote stability. Then progressed to gait training with negotiation around obstacles (cones) x 3 laps, cues to stay aligned within walker, most challenge noted with sharp turns. CGA throughout. Continued cues for improved turn and alignment with surface prior to descent with sit <> stand.   Seated on green air disc without UE support, completed lateral weight shift to R/L x 15 reps working on control and cues for posture/core activation. Then progressed to weight shift with reaching for cone  in various directions x 10 reps.   Tall Kneeling on Mat with BUE support on Bench positioned in front of patient. CGA to get into/out of position. In tall kneeling completed hip hinges 2 x 10 reps working on improved return to upright and maintian hip extension, cues to keep posture upright. Then completed alternating UE raises x 10 reps bilaterally. Lateral reaching to PT's hand x 5 reps bilateral directions. Then trialed no UE support, difficulty maintaining tall kneeling position without assistance.    03/20/21 Seated trunk flexion/extension holding 5# ball 15X  Seated SB holding 5# ball 15x with PT stabilization dropping to each elbow  STS 15x with 5# ball SLR stabilization against blue t-band 60s duration x2 ea. LE DKTC over red physioball 15x   LTR 15x over red physioball  Scifit seat 20 arms 7 L2 x70min   03/14/21  Seated marching, alt, 6# 15x per LE with latissimus press while seated on green disk to challenge seated balance  Seated LAQs 6# alt. 15x per LE with latissimus press while seated on green disk to challenge seated balance   STS with OH punch using yellow t-band 10x  SLR circles against red band, 60s each leg in supine position  Ambulation of  250ft outside of clinic with RW with CGA to accommodate facility fire drill  Supine/sit transitions 5x to ea. Side with random stopping points  Quadriped on elbows WS forward and back 10x  03/12/21 Ambulation of 459ft with RW and CGA, cued to pause walking before pivot turning to focus on task at hand and break down tasks into individual  components to control ataxia Seated on green disk, alternating marching and LAQs with lat press to activate core, 3# ankle weights, alternating pattern, 15 reps per LE Seated on disk, forward reaching using RW 10x              Seated core using disk and 3.3# ball using hip/shoulder tosses, chops and Vs for 10 reps per task  STS with OH reach once standing, 10 reps 03/09/21 Gait training: standing up from standard chair with large based quad cane walking up around cone (10 feet away) and sitting back down in chair: 2x, cues for 3step pattern and sequencing, min A to mod A required 1 x 230 feet with RW, CGA to min A with cues for smaller steps BBS performed 5x sit to stand performed.   03/01/2021 Pt seen for aquatic therapy today.  Treatment took place in water 3.25-4.8 ft in depth at the [facility] pool. Temp of water was 92. entered/exited the pool via stairs step to pattern independently with bilat rail.  Introduced pt to aquatic setting.                           Amb using water walker. forward, backward and side stepping/walking cues for increased step length, posture and weight shifting x 12 minutes. Pt requiring mod to min asst. Multiple LOB.  Seated -sit to stand transfers from water bench submerged to waist when standing.  Cueing for weight shift/execution. Initally completed with water walker, progressed to thick yellow noodle.  Pt requiring min-cga for completion. 2 x 5 Marching x 10 LAQ x 10.  Standing -step ups x leading with L/R LE.  Cueing for decreased impulsiveness /improved concentration on finishing first movement before moving to  next. -toe raises 2 x 10 Cueing for balance/slowing movements  Supine suspension Flutter kicking x 5 min  supported by Pt  Pt requires buoyancy for support and to offload joints with strengthening exercises. Viscosity of the water is needed for resistance of strengthening; water current perturbations provides challenge to standing balance unsupported, requiring increased core activation.   02/28/21: Gait training: 1 x 460 feet with tactile cues at pelvis to guide weight shifts cues for slower cadence and turning slowly Standing on floor: OH reaching and fwd reaching: alternating between reaches: 10x each Standing touching toes: 5x Gait training: chair to chair transfer with 180 deg turn: quad cane, CGA to min A: 4x  02/26/21: Gait training: 1 x 420 feet with tactile cues at pelvis to guide weight shifts cues for slower cadence and turning slowly Sit to stand: 10x no HHA Standing at countertop with wide BOS: moving basket with 20lbs from side to side to increase core engagement and weight shifts Picking up basket with 20lb from counter to chest: 5x Working on ankle strategy: standing heel and toe raises without HHA: very close guarding: 2 x 10  Standing contralateral reaching: 10x R and L    PATIENT EDUCATION: Education details: Scheduling more PT visits for October Person educated: Patient and caregiver Education method: Consulting civil engineer, Media planner, and Verbal cues Education comprehension: verbalized understanding     HOME EXERCISE PROGRAM: Continue daily walking with CG assist  ASSESSMENT:   CLINICAL IMPRESSION:  Focus on core strength and balance today, avoiding increasing achilles discomfort. Pt able to engage in higher level core strengthening using hand buoys and gaining supine and prone positions suspended. VC and assist need for execution initially.  Pt instructed to hold positions. He is more successful in sup than prone.  He did gain good muscle activation improving core  strength.  REHAB POTENTIAL: Good   CLINICAL DECISION MAKING: Stable/uncomplicated  GOALS: Goals reviewed with patient? Yes   SHORT TERM GOALS:   STG Name Target Date Goal status  1 Patient will be able to perform chair to chair transfer with CGA/SBA with use of RW for safety to improve independence. Baseline: min to mod A (12/26/20); CGA/minA (03/09/21) 01/23/2021 03/27/21 Able to transfer STS and stand pivot with SBA, goal met  2 Pt will be able to ambulate 115' with RW with CGA to min A with RW for safety to improve independence Baseline: min to mod A with RW 145'; 230 feet with RW and CGA to MIN A 01/23/2021 03/09/21 Goal met  3 Pt will be able to maintain standing balance for 30 sec to improve static balance Baseline: 10 sec before needing help due to LOB (12/26/20) 01/23/2021 02/05/21 able to stand with S for 30s, goal met  4. Pt will be able to perform sit to stand transfers with SBA to improve independence 04/06/21 Goal met 03/27/21  5. Pt will be able to perform 230 feet of walking with RW with SBA/CGA to improve household ambulation  04/06/21 Revised 03/09/21    LONG TERM GOALS:   LTG Name Target Date Goal status  1 Pt will demo >5 points improvement on Berg Balance Scale to improve static balance Baseline: 11/56 01/03/21 02/20/2021 INITIAL 03/09/21 BBS 21/56  2 Pt will be able to ambulate >500 feet with RW and SBA to improve walking endurance and safety. Baseline: 145' with RW min to mod A; 230 feet with CGA to Min A (03/09/21)  05/18/21 Progressing continue   3 Pt will be able to perform gait speed improvement by 0.72m/s to improve community ambulation. Baseline:0.14m/s (03/09/21) 05/16/21 Progressing continue  4 Pt  will demo 5 sec improvement in 5x sit to stand (with or without UE support) to improve functional strength Baseline: 5x sit to stand with bil UE 17 sec;  05/18/21 Progressing continue    PLAN: PT FREQUENCY: 2-3x/week   PT DURATION: 8 weeks   PLANNED INTERVENTIONS: Aquatic  therapy, Therapeutic exercises, Therapeutic activity, Neuro Muscular re-education, Balance training, Gait training, Patient/Family education, Joint mobilization, Stair training, Visual/preceptual remediation/compensation, Wheelchair mobility training, Cryotherapy, Moist heat, and Manual therapy   PLAN FOR NEXT SESSION:  Balance challenges progressed with seated core strengthening and tall kneel tasks as knees tolerate Standing/walking tasks with weighted cane, floor targets and stepping tasks, higher level gait training-stepping over and around obstacles, resisted walking, seated tasks on disk, f/u on achilles pain  Visit Diagnosis: Ataxia  Unsteadiness on feet  Muscle weakness (generalized)  Other lack of coordination  Cerebrovascular accident (CVA), unspecified mechanism ([place])     Problem List Patient Active Problem List   Diagnosis Date Noted   Thrombosis    Sleep disturbance    Dysphagia, post-stroke    PAF (paroxysmal atrial fibrillation) ([place])    Acute pulmonary embolism without acute cor pulmonale ([place])    Malnutrition of moderate degree 11/04/2020   Tracheostomy care ([place])    Pressure injury of skin 10/23/2020   Intracranial hemorrhage (HCC)    Atrial fibrillation ([place])    Prediabetes    Leukocytosis    Acute blood loss anemia    Hypernatremia    ICH (intracerebral hemorrhage) ([place]) 09/25/2020   Unilateral primary osteoarthritis, left knee 09/26/2016   Atrial flutter ([place]) 09/25/2013   Near syncope 09/25/2013   Annamarie Major) Mikala Podoll MPT  03/30/2021, 2:03 PM  [facility] [address] [place], [place], [address] Phone: [phone]   Fax:  [phone]

## 2021-03-30 NOTE — Therapy (Signed)
Walter [name] [address] [place], [place], [address] Phone: [phone]   Fax:  [phone]  Speech Language Pathology Treatment  Patient Details  Name: Walter [name] MRN: [id number] Date of Birth: [birth year] Referring Provider (SLP): Walter Blake, MD   Encounter Date: 03/29/2021   End of Session - 03/30/21 0853     Visit Number 26    Number of Visits 33    Date for SLP Re-Evaluation 04/20/21    Authorization Type Medicare    SLP Start Time 0850    SLP Stop Time  0930    SLP Time Calculation (min) 40 min    Activity Tolerance Patient tolerated treatment well             Past Medical History:  Diagnosis Date   Atrial flutter (Walter [name])    s/p ablation   DVT (deep venous thrombosis) (Walter [name])    Near syncope 09/25/2013   Paroxysmal atrial fibrillation (Walter [name])    Stroke Walter [name])    initially hemorragic (not on [facility]) followed by subsequent embolic stroke    Past Surgical History:  Procedure Laterality Date   A-FLUTTER ABLATION N/A 03/09/2020   Procedure: A-FLUTTER ABLATION;  Surgeon: Walter Grayer, MD;  Location: [place] CV LAB;  Service: Cardiovascular;  Laterality: N/A;   CARDIOVERSION N/A 09/27/2013   Procedure: CARDIOVERSION;  Surgeon: Walter Perla, MD;  Location: [facility] ENDOSCOPY;  Service: Cardiovascular;  Laterality: N/A;   IR ANGIO EXTERNAL CAROTID SEL EXT CAROTID UNI R MOD SED  09/26/2020   IR ANGIO INTRA EXTRACRAN SEL COM CAROTID INNOMINATE BILAT MOD SED  09/26/2020   IR ANGIO VERTEBRAL SEL VERTEBRAL UNI R MOD SED  09/26/2020   IR IVC FILTER PLMT / S&I /IMG GUID/MOD SED  10/08/2020   IR IVUS EACH ADDITIONAL NON CORONARY VESSEL  11/02/2020   IR PTA VENOUS EXCEPT DIALYSIS CIRCUIT  11/02/2020   IR RADIOLOGIST EVAL & MGMT  01/17/2021   IR THROMBECT VENO MECH MOD SED  10/27/2020   IR THROMBECT VENO MECH MOD SED  11/02/2020   IR US GUIDE VASC ACCESS LEFT  10/27/2020   IR US GUIDE VASC ACCESS RIGHT  09/26/2020   IR US  GUIDE VASC ACCESS RIGHT  11/02/2020   IR VENO/EXT/UNI RIGHT  11/02/2020   IR VENOCAVAGRAM IVC  10/27/2020   RETINAL DETACHMENT SURGERY     right knee arthroscopy     TEE WITHOUT CARDIOVERSION N/A 09/27/2013   Procedure: TRANSESOPHAGEAL ECHOCARDIOGRAM (TEE);  Surgeon: Walter Perla, MD;  Location: [facility] ENDOSCOPY;  Service: Cardiovascular;  Laterality: N/A;   WRIST SURGERY      There were no vitals filed for this visit.   Subjective Assessment - 03/29/21 0926     Subjective Im not sure if theres anything i do better after the stroke. do you know what im saying?                   ADULT SLP TREATMENT - 03/30/21 0001       General Information   Behavior/Cognition Alert;Cooperative;Pleasant mood;Requires cueing      Treatment Provided   Treatment provided Cognitive-Linquistic      Cognitive-Linquistic Treatment   Treatment focused on Cognition;Dysarthria    Skilled Treatment Initiated CLQT to see progress with cognition. Will complete next session.      Assessment / Recommendations / Plan   Plan Continue with current plan of care      Progression Toward Goals   Progression toward  goals Progressing toward goals                SLP Short Term Goals - 02/21/21 1552       SLP SHORT TERM GOAL #1   Title Pt will complete dysarthria HEP with occasional min A over 2 sessions    Status Not Met      SLP SHORT TERM GOAL #2   Title Pt will produce loud /a/ or "hey!" with at least high 80s dB average over 2 sessions    Status Not Met      SLP SHORT TERM GOAL #3   Title Pt will generate abdominal breathing in 16/20 answers to SLP questions over 2 sessions    Baseline 01-18-21    Status Partially Met      SLP SHORT TERM GOAL #4   Title Pt will demonstrate dysarthria compensations in 10 minute simple to mod complex conversation to be 75% intelligibile with occasional min A over 2 sessions    Status Not Met      SLP SHORT TERM GOAL #5   Title Pt will complete formal  cognitive linguistic assessment in first 1-2 ST sessions    Status Achieved      SLP SHORT TERM GOAL #6   Title Pt will use memory and attention compensations for appointments, medicine management, and other daily activities with occasional min A over 2 sessions    Status Not Met              SLP Long Term Goals - 03/27/21 1653       SLP LONG TERM GOAL #1   Title Pt will complete dysarthria HEP with rare min A over 2 sessions    Baseline 03-20-21, 03-22-21    Status Achieved      SLP LONG TERM GOAL #2   Title Pt will demonstrate dysarthria compensations in 10 minute simple conversation to be 90% intelligibile with rare min A over 3 sessions    Baseline 03-22-21    Time 8    Period Weeks    Status On-going   achieved, and revised for 10 minutes simple to mod complex conversation over 3 sessions   Target Date 04/20/21      SLP LONG TERM GOAL #3   Title Pt will demo improved sustained and selective attention during therapeutic tasks with less than 3 cues for active listening during 2 sessions    Time 1    Period Weeks    Status Deferred      SLP LONG TERM GOAL #4   Title Pt will demonstrate emergent and anticipatory awareness of impulsivity with less than 5 cues to modify behaviors during 2 sessions    Status On-going   and ongoing   Target Date 04/20/21      SLP LONG TERM GOAL #5   Title Pt will report reduced frustration and improved communication effectiveness via PROM by 2 points at last ST sessions    Baseline CES: 15.5 & V-RQOL:35    Time 8    Period Weeks    Status On-going    Target Date 04/20/21      SLP LONG TERM GOAL #6   Title Pt will selectively attend to cognitive linguistic task for 5 minutes with 3 or less cues for redirection over 3 sessions    Time 8    Period Weeks    Status On-going    Target Date 04/20/21  Plan - 03/30/21 0854     Clinical Impression Statement "Walter [name]" presents for OPST intervention secondary to stroke in March  2022. SLP began cogntive linguistic quick test (CLQT) - goals added if necessary. Pt agrees focus shift from speech to cognition will occur if necessary and pt agrees with this plan. Skilled ST is cont'd warranted to address dysarthria and cognition to maximize communication effectiveness and optimize functional independence and safety at home.    Speech Therapy Frequency 2x / week    Duration --   16 weeks   Treatment/Interventions Cueing hierarchy;Functional tasks;Patient/family education;Cognitive reorganization;Multimodal communcation approach;Language facilitation;Compensatory techniques;Internal/external aids;SLP instruction and feedback    Potential to Achieve Goals Fair    Potential Considerations Ability to learn/carryover information;Cooperation/participation level    SLP Home Exercise Plan provided    Consulted and Agree with Plan of Care Patient             Patient will benefit from skilled therapeutic intervention in order to improve the following deficits and impairments:   Dysarthria and anarthria  Cognitive communication deficit    Problem List Patient Active Problem List   Diagnosis Date Noted   Thrombosis    Sleep disturbance    Dysphagia, post-stroke    PAF (paroxysmal atrial fibrillation) (HCC)    Acute pulmonary embolism without acute cor pulmonale (HCC)    Malnutrition of moderate degree 11/04/2020   Tracheostomy care ([place])    Pressure injury of skin 10/23/2020   Intracranial hemorrhage (HCC)    Atrial fibrillation ([place])    Prediabetes    Leukocytosis    Acute blood loss anemia    Hypernatremia    ICH (intracerebral hemorrhage) ([place]) 09/25/2020   Unilateral primary osteoarthritis, left knee 09/26/2016   Atrial flutter ([place]) 09/25/2013   Near syncope 09/25/2013    [facility]  ,[place], [place]  03/30/2021, 8:55 AM  [place] [address] [place], [place], [address] Phone: [phone]    Fax:  [phone]   Name: Walter [name] MRN: [id number] Date of Birth: [birth year]

## 2021-04-02 ENCOUNTER — Ambulatory Visit (INDEPENDENT_AMBULATORY_CARE_PROVIDER_SITE_OTHER): Payer: Medicare Other | Admitting: Internal Medicine

## 2021-04-02 ENCOUNTER — Encounter: Payer: Self-pay | Admitting: Internal Medicine

## 2021-04-02 ENCOUNTER — Other Ambulatory Visit: Payer: Self-pay

## 2021-04-02 ENCOUNTER — Ambulatory Visit: Payer: Medicare Other

## 2021-04-02 VITALS — BP 132/82 | HR 76 | Ht 72.0 in | Wt 200.0 lb

## 2021-04-02 DIAGNOSIS — I639 Cerebral infarction, unspecified: Secondary | ICD-10-CM | POA: Diagnosis not present

## 2021-04-02 DIAGNOSIS — I483 Typical atrial flutter: Secondary | ICD-10-CM

## 2021-04-02 DIAGNOSIS — R0683 Snoring: Secondary | ICD-10-CM

## 2021-04-02 DIAGNOSIS — R5383 Other fatigue: Secondary | ICD-10-CM

## 2021-04-02 DIAGNOSIS — I480 Paroxysmal atrial fibrillation: Secondary | ICD-10-CM | POA: Diagnosis not present

## 2021-04-02 NOTE — Patient Instructions (Addendum)
Medication Instructions:  Your physician recommends that you continue on your current medications as directed. Please refer to the Current Medication list given to you today. *If you need a refill on your cardiac medications before your next appointment, please call your pharmacy*  Lab Work: None. If you have labs (blood work) drawn today and your tests are completely normal, you will receive your results only by: MyChart Message (if you have MyChart) OR A paper copy in the mail If you have any lab test that is abnormal or we need to change your treatment, we will call you to review the results.  Testing/Procedures: None.  Follow-Up: At [facility], you and your health needs are our priority.  As part of our continuing mission to provide you with exceptional heart care, we have created designated Provider Care Teams.  These Care Teams include your primary Cardiologist (physician) and Advanced Practice Providers (APPs -  Physician Assistants and Nurse Practitioners) who all work together to provide you with the care you need, when you need it.  Your physician wants you to follow-up in: 06-08-2021 at 3:15 pm with  Hillis Range, MD  We recommend signing up for the patient portal called "MyChart".  Sign up information is provided on this After Visit Summary.  MyChart is used to connect with patients for Virtual Visits (Telemedicine).  Patients are able to view lab/test results, encounter notes, upcoming appointments, etc.  Non-urgent messages can be sent to your provider as well.   To learn more about what you can do with MyChart, go to [email or web address].    Any Other Special Instructions Will Be Listed Below (If Applicable).

## 2021-04-02 NOTE — Procedures (Signed)
   Sleep Study Report  Patient Information Study Date: 03/08/21 Patient Name: Walter [name] Patient ID: [id number] Birth Date: [birth year] Age: 65 Gender: Male BMI: 25.7 (W=189 lb, H=6' 0'') Neck Circ.: 16 '' Referring Physician: Hillis Range, MD  TEST DESCRIPTION: Home sleep apnea testing was completed using the WatchPat, a Type 1 device, utilizing  peripheral arterial tonometry (PAT), chest movement, actigraphy, pulse oximetry, pulse rate, body position and snore.  AHI was calculated with apnea and hypopnea using valid sleep time as the denominator. RDI includes apneas,  hypopneas, and RERAs. The data acquired and the scoring of sleep and all associated events were performed in  accordance with the recommended standards and specifications as outlined in the AASM Manual for the Scoring of  Sleep and Associated Events 2.2.0 (2015).   FINDINGS:  1. Mild Obstructive Sleep Apnea with AHI 11.3/hr.  2. No Central Sleep Apnea with pAHIc 1.1/hr.  3. Oxygen desaturations as low as 81%.  4. Severe snoring was present. O2 sats were < 88% for 9.4 min.  5. Total sleep time was 8 hrs and 53 min.  6. 24% of total sleep time was spent in REM sleep.  7. Normal sleep onset latency at 16 min.  8. Prolonged REM sleep onset latency at 233 min.  9. Total awakenings were 5.   DIAGNOSIS: Mild Obstructive Sleep Apnea (G47.33) Nocturnal Hypoxemia  RECOMMENDATIONS: 1. Clinical correlation of these findings is necessary. The decision to treat obstructive sleep apnea (OSA) is usually  based on the presence of apnea symptoms or the presence of associated medical conditions such as Hypertension,  Congestive Heart Failure, Atrial Fibrillation or Obesity. The most common symptoms of OSA are snoring, gasping for  breath while sleeping, daytime sleepiness and fatigue.   2. Initiating apnea therapy is recommended given the presence of symptoms and/or associated conditions.  Recommend proceeding with one of  the following:   a. Auto-CPAP therapy with a pressure range of 5-20cm H2O.   b. An oral appliance (OA) that can be obtained from certain dentists with expertise in sleep medicine. These are  primarily of use in non-obese patients with mild and moderate disease.   c. An ENT consultation which may be useful to look for specific causes of obstruction and possible treatment  options.   d. If patient is intolerant to PAP therapy, consider referral to ENT for evaluation for hypoglossal nerve stimulator.   3. Close follow-up is necessary to ensure success with CPAP or oral appliance therapy for maximum benefit .  4. A follow-up oximetry study on CPAP is recommended to assess the adequacy of therapy and determine the need  for supplemental oxygen or the potential need for Bi-level therapy. An arterial blood gas to determine the adequacy of  baseline ventilation and oxygenation should also be considered.  5. Healthy sleep recommendations include: adequate nightly sleep (normal 7-9 hrs/night), avoidance of caffeine after  noon and alcohol near bedtime, and maintaining a sleep environment that is cool, dark and quiet.  6. Weight loss for overweight patients is recommended. Even modest amounts of weight loss can significantly  improve the severity of sleep apnea.  7. Snoring recommendations include: weight loss where appropriate, side sleeping, and avoidance of alcohol before  bed.  8. Operation of motor vehicle should be avoided when sleepy.  Signature: Electronically Signed: 04/02/21 Armanda Magic, MD; [facility]; Diplomat, American Board of Sleep Medicine Report prepared by: Armanda Magic

## 2021-04-02 NOTE — Progress Notes (Signed)
PCP: Clovis Riley, L.August Saucer, MD   Primary EP: Dr Johney Frame  Walter [name] is a 65 y.o. male who presents today for routine electrophysiology followup.  Since last being seen in our clinic, the patient reports doing very well.   He has good spirits.  Working hard in rehab.  Recently developed R achilles injury which has set him back some. Today, he denies symptoms of palpitations, chest pain, shortness of breath,   or syncope.  The patient is otherwise without complaint today.   Past Medical History:  Diagnosis Date   Atrial flutter [facility])    s/p ablation   DVT (deep venous thrombosis) (HCC)    Near syncope 09/25/2013   Paroxysmal atrial fibrillation (HCC)    Stroke [facility])    initially hemorragic (not on OAC) followed by subsequent embolic stroke   Past Surgical History:  Procedure Laterality Date   A-FLUTTER ABLATION N/A 03/09/2020   Procedure: A-FLUTTER ABLATION;  Surgeon: Hillis Range, MD;  Location: [facility] INVASIVE CV LAB;  Service: Cardiovascular;  Laterality: N/A;   CARDIOVERSION N/A 09/27/2013   Procedure: CARDIOVERSION;  Surgeon: Lewayne Bunting, MD;  Location: [facility] ENDOSCOPY;  Service: Cardiovascular;  Laterality: N/A;   IR ANGIO EXTERNAL CAROTID SEL EXT CAROTID UNI R MOD SED  09/26/2020   IR ANGIO INTRA EXTRACRAN SEL COM CAROTID INNOMINATE BILAT MOD SED  09/26/2020   IR ANGIO VERTEBRAL SEL VERTEBRAL UNI R MOD SED  09/26/2020   IR IVC FILTER PLMT / S&I /IMG GUID/MOD SED  10/08/2020   IR IVUS EACH ADDITIONAL NON CORONARY VESSEL  11/02/2020   IR PTA VENOUS EXCEPT DIALYSIS CIRCUIT  11/02/2020   IR RADIOLOGIST EVAL & MGMT  01/17/2021   IR THROMBECT VENO MECH MOD SED  10/27/2020   IR THROMBECT VENO MECH MOD SED  11/02/2020   IR US GUIDE VASC ACCESS LEFT  10/27/2020   IR US GUIDE VASC ACCESS RIGHT  09/26/2020   IR US GUIDE VASC ACCESS RIGHT  11/02/2020   IR VENO/EXT/UNI RIGHT  11/02/2020   IR VENOCAVAGRAM IVC  10/27/2020   RETINAL DETACHMENT SURGERY     right knee arthroscopy     TEE WITHOUT CARDIOVERSION  N/A 09/27/2013   Procedure: TRANSESOPHAGEAL ECHOCARDIOGRAM (TEE);  Surgeon: Lewayne Bunting, MD;  Location: [facility] ENDOSCOPY;  Service: Cardiovascular;  Laterality: N/A;   WRIST SURGERY      ROS- all systems are reviewed and negatives except as per HPI above  Current Outpatient Medications  Medication Sig Dispense Refill   azelastine (ASTELIN) 0.1 % nasal spray      clonazepam (KLONOPIN) 0.125 MG disintegrating tablet Take 1 tablet (0.125 mg total) by mouth at bedtime as needed (sleep). (Patient taking differently: Take 0.125 mg by mouth as needed (sleep).) 20 tablet 0   desloratadine (CLARINEX) 5 MG tablet Take 5 mg by mouth daily.     diltiazem (CARDIZEM CD) 180 MG 24 hr capsule Take 1 capsule (180 mg total) by mouth daily. 90 capsule 3   EPINEPHrine 0.3 mg/0.3 mL IJ SOAJ injection      ketoconazole (NIZORAL) 2 % cream Apply topically.     mirtazapine (REMERON) 7.5 MG tablet Take 2 tablets (15 mg total) by mouth at bedtime. 180 tablet 0   Olopatadine HCl 0.2 % SOLN      sertraline (ZOLOFT) 100 MG tablet Take 100 mg by mouth daily.     apixaban (ELIQUIS) 5 MG TABS tablet Take 5 mg by mouth 2 (two) times daily. (Patient not taking: Reported  on 04/02/2021)     doxazosin (CARDURA) 4 MG tablet Take 4 mg by mouth daily.     ELIQUIS 5 MG TABS tablet TAKE 1 TABLET BY MOUTH TWICE A DAY 60 tablet 3   loratadine (CLARITIN) 10 MG tablet      mirabegron ER (MYRBETRIQ) 50 MG TB24 tablet Take 50 mg by mouth daily.     No current facility-administered medications for this visit.    Physical Exam: Vitals:   04/02/21 0957  BP: 132/82  Pulse: 76  SpO2: 97%  Weight: 200 lb (90.7 kg)  Height: 6' (1.829 m)    GEN- The patient is chronically appearing, alert and oriented x 3 today.   Head- normocephalic, atraumatic Eyes-  R eye visually impaired Ears- hearing intact Oropharynx- clear Lungs- Clear to ausculation bilaterally, normal work of breathing Heart- Regular rate and rhythm, no murmurs, rubs or  gallops, PMI not laterally displaced GI- soft, NT, ND, + BS Extremities- no clubbing, cyanosis, or edema Neuro- expressive aphasia  Wt Readings from Last 3 Encounters:  04/02/21 200 lb (90.7 kg)  02/15/21 185 lb (83.9 kg)  01/09/21 179 lb 6.4 oz (81.4 kg)    EKG tracing ordered today is personally reviewed and shows sinus rhythm  Assessment and Plan:  Paroxysmal atrial fibrillation Maintaining sinus On eliquis for stroke prevention He did have prior hemorrhagic stroke but has been cleared by neurology to now take [facility] He also has had prior PTE/ DVTs  2. HTN Stable No change required today   3. Prior stroke/ DVTs Follows in rehab and with Dr Pearlean Brownie Continue long term [facility] Not sure that Watchman would be an appropriate alternative for him currently.  We could readdress eventually as he improves.  4. Snoring His wife is concerned that he has sleep apnea.   Will follow-up on sleep study  Risks, benefits and potential toxicities for medications prescribed and/or refilled reviewed with patient today.   Hillis Range MD, [facility] 04/02/2021 10:17 AM

## 2021-04-03 ENCOUNTER — Ambulatory Visit: Payer: Medicare Other | Admitting: Occupational Therapy

## 2021-04-03 ENCOUNTER — Ambulatory Visit: Payer: Medicare Other

## 2021-04-03 ENCOUNTER — Encounter: Payer: Medicare Other | Admitting: Occupational Therapy

## 2021-04-03 ENCOUNTER — Ambulatory Visit: Payer: Medicare Other | Attending: Family Medicine

## 2021-04-03 ENCOUNTER — Encounter: Payer: Self-pay | Admitting: Occupational Therapy

## 2021-04-03 DIAGNOSIS — R2681 Unsteadiness on feet: Secondary | ICD-10-CM | POA: Insufficient documentation

## 2021-04-03 DIAGNOSIS — R293 Abnormal posture: Secondary | ICD-10-CM | POA: Diagnosis present

## 2021-04-03 DIAGNOSIS — R41841 Cognitive communication deficit: Secondary | ICD-10-CM | POA: Diagnosis not present

## 2021-04-03 DIAGNOSIS — R278 Other lack of coordination: Secondary | ICD-10-CM

## 2021-04-03 DIAGNOSIS — R270 Ataxia, unspecified: Secondary | ICD-10-CM

## 2021-04-03 DIAGNOSIS — R208 Other disturbances of skin sensation: Secondary | ICD-10-CM | POA: Insufficient documentation

## 2021-04-03 DIAGNOSIS — R41844 Frontal lobe and executive function deficit: Secondary | ICD-10-CM | POA: Insufficient documentation

## 2021-04-03 DIAGNOSIS — M6281 Muscle weakness (generalized): Secondary | ICD-10-CM | POA: Diagnosis present

## 2021-04-03 DIAGNOSIS — R2689 Other abnormalities of gait and mobility: Secondary | ICD-10-CM | POA: Diagnosis present

## 2021-04-03 DIAGNOSIS — R41842 Visuospatial deficit: Secondary | ICD-10-CM | POA: Insufficient documentation

## 2021-04-03 DIAGNOSIS — I639 Cerebral infarction, unspecified: Secondary | ICD-10-CM | POA: Insufficient documentation

## 2021-04-03 DIAGNOSIS — I69218 Other symptoms and signs involving cognitive functions following other nontraumatic intracranial hemorrhage: Secondary | ICD-10-CM | POA: Diagnosis present

## 2021-04-03 DIAGNOSIS — R471 Dysarthria and anarthria: Secondary | ICD-10-CM | POA: Insufficient documentation

## 2021-04-03 NOTE — Therapy (Signed)
[facility] [address] [place], [place], [address] Phone: [phone]   Fax:  [phone]  Speech Language Pathology Treatment  Patient Details  Name: Walter [name] MRN: [id number] Date of Birth: [birth year] Referring Provider (SLP): Charlett Blake, MD   Encounter Date: 04/03/2021   End of Session - 04/03/21 1720     Visit Number 27    Number of Visits 32    Date for SLP Re-Evaluation 04/20/21    Authorization Type Medicare    SLP Start Time 1319    SLP Stop Time  1400    SLP Time Calculation (min) 41 min    Activity Tolerance Patient tolerated treatment well             Past Medical History:  Diagnosis Date   Atrial flutter ([place])    s/p ablation   DVT (deep venous thrombosis) ([place])    Near syncope 09/25/2013   Paroxysmal atrial fibrillation ([place])    Stroke [facility])    initially hemorragic (not on [facility]) followed by subsequent embolic stroke    Past Surgical History:  Procedure Laterality Date   A-FLUTTER ABLATION N/A 03/09/2020   Procedure: A-FLUTTER ABLATION;  Surgeon: Thompson Grayer, MD;  Location: [facility] CV LAB;  Service: Cardiovascular;  Laterality: N/A;   CARDIOVERSION N/A 09/27/2013   Procedure: CARDIOVERSION;  Surgeon: Lelon Perla, MD;  Location: [facility] ENDOSCOPY;  Service: Cardiovascular;  Laterality: N/A;   IR ANGIO EXTERNAL CAROTID SEL EXT CAROTID UNI R MOD SED  09/26/2020   IR ANGIO INTRA EXTRACRAN SEL COM CAROTID INNOMINATE BILAT MOD SED  09/26/2020   IR ANGIO VERTEBRAL SEL VERTEBRAL UNI R MOD SED  09/26/2020   IR IVC FILTER PLMT / S&I /IMG GUID/MOD SED  10/08/2020   IR IVUS EACH ADDITIONAL NON CORONARY VESSEL  11/02/2020   IR PTA VENOUS EXCEPT DIALYSIS CIRCUIT  11/02/2020   IR RADIOLOGIST EVAL & MGMT  01/17/2021   IR THROMBECT VENO MECH MOD SED  10/27/2020   IR THROMBECT VENO MECH MOD SED  11/02/2020   IR US GUIDE VASC ACCESS LEFT  10/27/2020   IR US GUIDE VASC ACCESS RIGHT  09/26/2020   IR US  GUIDE VASC ACCESS RIGHT  11/02/2020   IR VENO/EXT/UNI RIGHT  11/02/2020   IR VENOCAVAGRAM IVC  10/27/2020   RETINAL DETACHMENT SURGERY     right knee arthroscopy     TEE WITHOUT CARDIOVERSION N/A 09/27/2013   Procedure: TRANSESOPHAGEAL ECHOCARDIOGRAM (TEE);  Surgeon: Lelon Perla, MD;  Location: [facility] ENDOSCOPY;  Service: Cardiovascular;  Laterality: N/A;   WRIST SURGERY      There were no vitals filed for this visit.          ADULT SLP TREATMENT - 04/03/21 1149       General Information   Behavior/Cognition Alert;Cooperative;Pleasant mood;Requires cueing      Treatment Provided   Treatment provided Cognitive-Linquistic      Cognitive-Linquistic Treatment   Treatment focused on Cognition;Dysarthria    Skilled Treatment Pt expressed disappointment about the partial tear in his Achilles. Completed CLQT today - pt's scores: Attention-170 (mild), Memory-157 (WNL), Executive Function-23 (mild), Language-29 (WNL), Visuospatial Skills-74 (mild), Clock drawing-12 (WNL). Pt's clock drawing affected mildly by pt's visual deficits (double vision), and attention and executive function scores are likely hindered by pt's impulsivity. Pt's score for mazes was 0 based on pt's impulsivity on first maze, and frustration on the second maze-pt essentially abandoned the task. If he would have  completed the most he oculd have obtained would have been 2/4 due to two > 1/2" stray trails. Pt completed coin combinations with SLP naming amounts 100% success, and alternating with answering questions about facts that SLP read to pt with 100% success (one sentence facts with a question afterwards about the setnence). SLP then engaged in conversation with [name] in which SLP alternated topics and pt demonstrated WNL performance during/after each topic shift. Possibly work with divided attention next session (?).      Assessment / Recommendations / Plan   Plan Continue with current plan of care      Progression Toward  Goals   Progression toward goals Progressing toward goals                SLP Short Term Goals - 02/21/21 1552       SLP SHORT TERM GOAL #1   Title Pt will complete dysarthria HEP with occasional min A over 2 sessions    Status Not Met      SLP SHORT TERM GOAL #2   Title Pt will produce loud /a/ or "hey!" with at least high 80s dB average over 2 sessions    Status Not Met      SLP SHORT TERM GOAL #3   Title Pt will generate abdominal breathing in 16/20 answers to SLP questions over 2 sessions    Baseline 01-18-21    Status Partially Met      SLP SHORT TERM GOAL #4   Title Pt will demonstrate dysarthria compensations in 10 minute simple to mod complex conversation to be 75% intelligibile with occasional min A over 2 sessions    Status Not Met      SLP SHORT TERM GOAL #5   Title Pt will complete formal cognitive linguistic assessment in first 1-2 ST sessions    Status Achieved      SLP SHORT TERM GOAL #6   Title Pt will use memory and attention compensations for appointments, medicine management, and other daily activities with occasional min A over 2 sessions    Status Not Met              SLP Long Term Goals - 04/03/21 1721       SLP LONG TERM GOAL #1   Title Pt will complete dysarthria HEP with rare min A over 2 sessions    Baseline 03-20-21, 03-22-21    Status Achieved      SLP LONG TERM GOAL #2   Title Pt will demonstrate dysarthria compensations in 10 minute simple conversation to be 90% intelligibile with rare min A over 3 sessions    Baseline 03-22-21; 03-29-21, 04-03-21    Time 8    Period Weeks    Status Achieved   achieved, and revised for 10 minutes simple to mod complex conversation over 3 sessions     SLP LONG TERM GOAL #3   Title Pt will demo improved sustained and selective attention during therapeutic tasks with less than 3 cues for active listening during 2 sessions    Time 1    Period Weeks    Status Achieved    Target Date 04/20/21       SLP LONG TERM GOAL #4   Title Pt will demonstrate anticipatory awareness of impulsivity with min cues to modify behaviors during 2 sessions    Time 2    Period Weeks    Status Revised   and ongoing   Target Date 04/20/21  SLP LONG TERM GOAL #5   Title Pt will report reduced frustration and improved communication effectiveness via PROM by 2 points at last ST sessions    Baseline CES: 15.5 & V-RQOL:35    Time 8    Period Weeks    Status On-going    Target Date 04/20/21      SLP LONG TERM GOAL #6   Title Pt will selectively attend to cognitive linguistic task for 5 minutes with 3 or less cues for redirection over 3 sessions    Baseline 04-03-21    Time 8    Period Weeks    Status On-going    Target Date 04/20/21              Plan - 04/03/21 1720     Clinical Impression Statement "[name]" presents for OPST intervention secondary to stroke in March 2022. SLP completed cogntive linguistic quick test (CLQT) - goals added. See daily note for details; Improvement seen. Pt agrees focus shift from speech to cognition. Skilled ST is cont'd warranted to address cognition to maximize communication effectiveness and optimize functional independence and safety at home.    Speech Therapy Frequency 2x / week    Duration --   16 weeks   Treatment/Interventions Cueing hierarchy;Functional tasks;Patient/family education;Cognitive reorganization;Multimodal communcation approach;Language facilitation;Compensatory techniques;Internal/external aids;SLP instruction and feedback    Potential to Achieve Goals Fair    Potential Considerations Ability to learn/carryover information;Cooperation/participation level    SLP Home Exercise Plan provided    Consulted and Agree with Plan of Care Patient             Patient will benefit from skilled therapeutic intervention in order to improve the following deficits and impairments:   Cognitive communication deficit  Dysarthria and  anarthria    Problem List Patient Active Problem List   Diagnosis Date Noted   Thrombosis    Sleep disturbance    Dysphagia, post-stroke    PAF (paroxysmal atrial fibrillation) (HCC)    Acute pulmonary embolism without acute cor pulmonale (HCC)    Malnutrition of moderate degree 11/04/2020   Tracheostomy care ([place])    Pressure injury of skin 10/23/2020   Intracranial hemorrhage (HCC)    Atrial fibrillation ([place])    Prediabetes    Leukocytosis    Acute blood loss anemia    Hypernatremia    ICH (intracerebral hemorrhage) ([place]) 09/25/2020   Unilateral primary osteoarthritis, left knee 09/26/2016   Atrial flutter ([place]) 09/25/2013   Near syncope 09/25/2013    [facility] ,[place], [place]  04/03/2021, 5:25 PM  [facility] [address] [place], [place], [address] Phone: [phone]   Fax:  [phone]   Name: Walter [name] MRN: [id number] Date of Birth: 23-May-1956

## 2021-04-03 NOTE — Therapy (Signed)
OUTPATIENT PHYSICAL THERAPY TREATMENT NOTE/PROGRESS NOTE Patient Name: Walter [name] MRN: [id number] DOB:February 10, 1956, 65 y.o., male Today's Date: 04/03/2021  PCP: Alroy Dust, L.Marlou Sa, MD REFERRING PROVIDER: Alroy Dust, Carlean Jews.Marlou Sa, MD  Physical Therapy 10th visit Progress Note   Dates of Reporting Period:12/26/20-04/03/21  See Note below for Objective Data and Assessment of Progress/Goals.     PT End of Session - 04/03/21 1028     Visit Number 30    Number of Visits 44    Date for PT Re-Evaluation 05/04/21    Authorization Type Medicare 12/26/20 to 02/20/21; Recert 07/04/99 to 31/43/88; 87N PN/recert on 01/07/71 to 82/0/60    Authorization Time Period 03/09/21-05/04/21    Progress Note Due on Visit Ken Caryl During Treatment Gait belt    Activity Tolerance Patient tolerated treatment well    Behavior During Therapy Impulsive                   Past Medical History:  Diagnosis Date   Atrial flutter [facility])    s/p ablation   DVT (deep venous thrombosis) ([place])    Near syncope 09/25/2013   Paroxysmal atrial fibrillation (HCC)    Stroke [facility])    initially hemorragic (not on [place]) followed by subsequent embolic stroke   Past Surgical History:  Procedure Laterality Date   A-FLUTTER ABLATION N/A 03/09/2020   Procedure: A-FLUTTER ABLATION;  Surgeon: Thompson Grayer, MD;  Location: [place] CV LAB;  Service: Cardiovascular;  Laterality: N/A;   CARDIOVERSION N/A 09/27/2013   Procedure: CARDIOVERSION;  Surgeon: Lelon Perla, MD;  Location: [facility] ENDOSCOPY;  Service: Cardiovascular;  Laterality: N/A;   IR ANGIO EXTERNAL CAROTID SEL EXT CAROTID UNI R MOD SED  09/26/2020   IR ANGIO INTRA EXTRACRAN SEL COM CAROTID INNOMINATE BILAT MOD SED  09/26/2020   IR ANGIO VERTEBRAL SEL VERTEBRAL UNI R MOD SED  09/26/2020   IR IVC FILTER PLMT / S&I /IMG GUID/MOD SED  10/08/2020   IR IVUS EACH ADDITIONAL NON CORONARY VESSEL  11/02/2020   IR PTA VENOUS EXCEPT DIALYSIS CIRCUIT  11/02/2020   IR  RADIOLOGIST EVAL & MGMT  01/17/2021   IR THROMBECT VENO MECH MOD SED  10/27/2020   IR THROMBECT VENO MECH MOD SED  11/02/2020   IR US GUIDE VASC ACCESS LEFT  10/27/2020   IR US GUIDE VASC ACCESS RIGHT  09/26/2020   IR US GUIDE VASC ACCESS RIGHT  11/02/2020   IR VENO/EXT/UNI RIGHT  11/02/2020   IR VENOCAVAGRAM IVC  10/27/2020   RETINAL DETACHMENT SURGERY     right knee arthroscopy     TEE WITHOUT CARDIOVERSION N/A 09/27/2013   Procedure: TRANSESOPHAGEAL ECHOCARDIOGRAM (TEE);  Surgeon: Lelon Perla, MD;  Location: [facility] ENDOSCOPY;  Service: Cardiovascular;  Laterality: N/A;   WRIST SURGERY     Patient Active Problem List   Diagnosis Date Noted   Thrombosis    Sleep disturbance    Dysphagia, post-stroke    PAF (paroxysmal atrial fibrillation) ([place])    Acute pulmonary embolism without acute cor pulmonale (HCC)    Malnutrition of moderate degree 11/04/2020   Tracheostomy care ([place])    Pressure injury of skin 10/23/2020   Intracranial hemorrhage (HCC)    Atrial fibrillation (HCC)    Prediabetes    Leukocytosis    Acute blood loss anemia    Hypernatremia    ICH (intracerebral hemorrhage) ([place]) 09/25/2020   Unilateral primary osteoarthritis, left knee 09/26/2016   Atrial flutter ([place]) 09/25/2013   Near  syncope 09/25/2013    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 Sharol Given, socially, and was informed he had a partial R achilles tendon tear.  Placed in a walking boot for 6 weeks, patient reports no restrictions given to him.  TODAY'S TREATMENT:   04/03/21  Seated on green disk, forward reaching to floor wih 2.5KG ball followed by [facility] reaching ea.  For 20 reps, single movement Seated trunk flexion/extension over sitting disk, extension blocked by bolster, 15x with 2.5KG ball  Seated B rotation holding 2.5KG ball 20x, hip to hip  Seated LAQs on disk, 4# wts, with lat press alternating pattern, 1x20  Seated marching on disk, 4# wts with lat press alternating pattern 1x20  Supine SLR circled CW/CCW against red band 2 min duration ea. LE  5x STS in 23.5s   03/29/21  Seated on green disk, forward reaching to floor wih 2KG ball followed by [facility] reaching ea. For 20 reps, single movement Seated trunk flexion/extension over sitting disk, extension blocked by bolster, 15x.  Seated B rotation holding 2KG ball 20x, hip to hip  Seated LAQs on disk, 3# wts, with lat press alternating pattern, 2x15  Seated marching on disk, 3# wts with lat press alternating pattern  Supine SLR circled CW/CCW against red band 1 min duration ea. LE  Scifit L4 8'       03/27/21  Seated on green disk, forward reaching to floor wih 3KG ball followed by [facility] reaching ea. For 15 reps Seated trunk flexion/extension over sitting disk, extension blocked by bolster, 15x.  Seated B rotation holding 3KG ball 15x, hip to hip  Ambulation with QC 79f, SPC 363fthen SPAdvanced Eye Surgery Centerith 3# wt [id number] Min/ModA for balance corrections  From green disk, STS 15x with 3KG ball   Seated on green disk, OH press with 3KG ball      03/22/21  Completed gait training x 230 ft with RW, PT facilitating at pelvis for improved weight shift and cues for controlled stepping to promote stability. Then progressed to gait training with negotiation around obstacles (cones) x 3 laps, cues to stay aligned within walker, most challenge noted with sharp turns. CGA throughout. Continued cues for improved turn and alignment with surface prior to descent with sit <> stand.   Seated on green air disc without UE support, completed lateral weight shift to R/L x 15 reps working on control and cues for posture/core  activation. Then progressed to weight shift with reaching for cone in various directions x 10 reps.   Tall Kneeling on Mat with BUE support on Bench positioned in front of patient. CGA to get into/out of position. In tall kneeling completed hip hinges 2 x 10 reps working on improved return to upright and maintian hip extension, cues to keep posture upright. Then completed alternating UE raises x 10 reps bilaterally. Lateral reaching to PT's hand x 5 reps bilateral directions. Then trialed no UE support, difficulty maintaining tall kneeling position without assistance.    03/20/21 Seated trunk flexion/extension holding 5# ball 15X  Seated SB holding 5# ball 15x with PT stabilization dropping to each elbow  STS 15x with 5# ball SLR stabilization against blue t-band 60s duration x2  ea. LE DKTC over red physioball 15x   LTR 15x over red physioball  Scifit seat 20 arms 7 L2 x96mn   03/14/21  Seated marching, alt, 6# 15x per LE with latissimus press while seated on green disk to challenge seated balance  Seated LAQs 6# alt. 15x per LE with latissimus press while seated on green disk to challenge seated balance   STS with OH punch using yellow t-band 10x  SLR circles against red band, 60s each leg in supine position  Ambulation of 2041foutside of clinic with RW with CGA to accommodate facility fire drill  Supine/sit transitions 5x to ea. Side with random stopping points  Quadriped on elbows WS forward and back 10x  03/12/21 Ambulation of [id number] RW and CGA, cued to pause walking before pivot turning to focus on task at hand and break down tasks into individual  components to control ataxia Seated on green disk, alternating marching and LAQs with lat press to activate core, 3# ankle weights, alternating pattern, 15 reps per LE Seated on disk, forward reaching using RW 10x              Seated core using disk and 3.3# ball using hip/shoulder tosses, chops and Vs for 10 reps per task  STS with OH  reach once standing, 10 reps 03/09/21 Gait training: standing up from standard chair with large based quad cane walking up around cone (10 feet away) and sitting back down in chair: 2x, cues for 3step pattern and sequencing, min A to mod A required 1 x 230 feet with RW, CGA to min A with cues for smaller steps BBS performed 5x sit to stand performed.   03/01/2021 Pt seen for aquatic therapy today.  Treatment took place in water 3.25-4.8 ft [facility] Temp of water was 92. entered/exited the pool via stairs step to pattern independently with bilat rail.  Introduced pt to aquatic setting.                           Amb using water walker. forward, backward and side stepping/walking cues for increased step length, posture and weight shifting x 12 minutes. Pt requiring mod to min asst. Multiple LOB.  Seated -sit to stand transfers from water bench submerged to waist when standing.  Cueing for weight shift/execution. Initally completed with water walker, progressed to thick yellow noodle.  Pt requiring min-cga for completion. 2 x 5 Marching x 10 LAQ x 10.  Standing -step ups x leading with L/R LE.  Cueing for decreased impulsiveness /improved concentration on finishing first movement before moving to next. -toe raises 2 x 10 Cueing for balance/slowing movements  Supine suspension Flutter kicking x 5 min supported by Pt  Pt requires buoyancy for support and to offload joints with strengthening exercises. Viscosity of the water is needed for resistance of strengthening; water current perturbations provides challenge to standing balance unsupported, requiring increased core activation.   02/28/21: Gait training: 1 x 460 feet with tactile cues at pelvis to guide weight shifts cues for slower cadence and turning slowly Standing on floor: OH reaching and fwd reaching: alternating between reaches: 10x each Standing touching toes: 5x Gait training: chair to chair  transfer with 180 deg turn: quad cane, CGA to min A: 4x  02/26/21: Gait training: 1 x 420 feet with tactile cues at pelvis to guide weight shifts cues for slower cadence and turning slowly Sit  to stand: 10x no HHA Standing at countertop with wide BOS: moving basket with 20lbs from side to side to increase core engagement and weight shifts Picking up basket with 20lb from counter to chest: 5x Working on ankle strategy: standing heel and toe raises without HHA: very close guarding: 2 x 10  Standing contralateral reaching: 10x R and L    PATIENT EDUCATION: Education details: Scheduling more PT visits for October Person educated: Patient and caregiver Education method: Consulting civil engineer, Media planner, and Verbal cues Education comprehension: verbalized understanding     HOME EXERCISE PROGRAM: Refrain from ambulation due to R achilles injury  ASSESSMENT:   CLINICAL IMPRESSION:  Patient seen by Dr. Sharol Given as a courtesy and was dxed with a partial R achilles tear, placed in a walking boot for 6 weeks WBAT. Will confirm orders with Dr. Jess Barters office.  Attempted STS tasks but patient demonstrates unsteadiness with R walking boot increasing his fall risk.  Deferred walking today as patient not yet stable in R walking boot.  Continue core and seated balance tasks until patient more stable in walking boot.  Unable to assess goals this visit due to recently dxed R achilles injury.      REHAB POTENTIAL: Good   CLINICAL DECISION MAKING: Stable/uncomplicated  GOALS: Goals reviewed with patient? Yes   SHORT TERM GOALS:   STG Name Target Date Goal status  1 Patient will be able to perform chair to chair transfer with CGA/SBA with use of RW for safety to improve independence. Baseline: min to mod A (12/26/20); CGA/minA (03/09/21) 01/23/2021 03/27/21 Able to transfer STS and stand pivot with SBA, goal met  2 Pt will be able to ambulate 115' with RW with CGA to min A with RW for safety to improve  independence Baseline: min to mod A with RW 145'; 230 feet with RW and CGA to MIN A 01/23/2021 03/09/21 Goal met  3 Pt will be able to maintain standing balance for 30 sec to improve static balance Baseline: 10 sec before needing help due to LOB (12/26/20) 01/23/2021 02/05/21 able to stand with S for 30s, goal met  4. Pt will be able to perform sit to stand transfers with SBA to improve independence 04/06/21 Goal met 03/27/21  5. Pt will be able to perform 230 feet of walking with RW with SBA/CGA to improve household ambulation  04/06/21 Revised 03/09/21 04/03/21 UTA due to achilles partial tear    LONG TERM GOALS:   LTG Name Target Date Goal status  1 Pt will demo >5 points improvement on Berg Balance Scale to improve static balance Baseline: 11/56 01/03/21 02/20/2021 INITIAL 03/09/21 BBS 21/56 04/03/21 UTA due to achilles partial tear  2 Pt will be able to ambulate >500 feet with RW and SBA to improve walking endurance and safety. Baseline: 145' with RW min to mod A; 230 feet with CGA to Min A (03/09/21)  05/18/21 Progressing continue  04/03/21 UTA due to achilles partial tear  3 Pt  will be able to perform gait speed improvement by 0.97ms to improve community ambulation. Baseline:0.262m (03/09/21) 05/16/21 Progressing continue 04/03/21 UTA due to achilles partial tear  4 Pt will demo 5 sec improvement in 5x sit to stand (with or without UE support) to improve functional strength Baseline: 5x sit to stand with bil UE 17 sec;  05/18/21 Progressing continue 04/03/21 Assessed at 23.5s, performance limited by R walking boot    PLAN: PT FREQUENCY: 2-3x/week   PT DURATION: 8 weeks   PLANNED  INTERVENTIONS: Aquatic therapy, Therapeutic exercises, Therapeutic activity, Neuro Muscular re-education, Balance training, Gait training, Patient/Family education, Joint mobilization, Stair training, Visual/preceptual remediation/compensation, Wheelchair mobility training, Cryotherapy, Moist heat, and Manual therapy    PLAN FOR NEXT SESSION:  Continue core and balance strengthening until patient becomes more proficient with R walking boot.  Visit Diagnosis: Ataxia  Unsteadiness on feet  Muscle weakness (generalized)  Other lack of coordination     Problem List Patient Active Problem List   Diagnosis Date Noted   Thrombosis    Sleep disturbance    Dysphagia, post-stroke    PAF (paroxysmal atrial fibrillation) ([place])    Acute pulmonary embolism without acute cor pulmonale (HCC)    Malnutrition of moderate degree 11/04/2020   Tracheostomy care ([place])    Pressure injury of skin 10/23/2020   Intracranial hemorrhage (HCC)    Atrial fibrillation ([place])    Prediabetes    Leukocytosis    Acute blood loss anemia    Hypernatremia    ICH (intracerebral hemorrhage) ([place]) 09/25/2020   Unilateral primary osteoarthritis, left knee 09/26/2016   Atrial flutter ([place]) 09/25/2013   Near syncope 09/25/2013    Lanice Shirts, PT 04/03/2021, 10:30 AM  [place] [address] [place], [place], [address] Phone: [phone]   Fax:  [phone]

## 2021-04-03 NOTE — Telephone Encounter (Signed)
Patient/daughter ask if he qualifies for the inspire device or oral appliance

## 2021-04-03 NOTE — Therapy (Signed)
[facility] [address] [place], [place], [address] Phone: [phone]   Fax:  [phone]  Occupational Therapy Treatment  Patient Details  Name: Walter [name] MRN: [id number] Date of Birth: May 08, 1956 Referring Provider (OT): Dr. Alysia Penna   Encounter Date: 04/03/2021   OT End of Session - 04/03/21 1114     Visit Number 26    Number of Visits 4    Date for OT Re-Evaluation 04/27/21    Authorization Type Medicare Part A & B- need to add [place] - Visit Number 33    Authorization - Number of Visits 30    Progress Note Due on Visit 69    OT Start Time 1020    OT Stop Time 1100    OT Time Calculation (min) 40 min             Past Medical History:  Diagnosis Date   Atrial flutter ([place])    s/p ablation   DVT (deep venous thrombosis) ([place])    Near syncope 09/25/2013   Paroxysmal atrial fibrillation ([place])    Stroke [facility])    initially hemorragic (not on [facility]) followed by subsequent embolic stroke    Past Surgical History:  Procedure Laterality Date   A-FLUTTER ABLATION N/A 03/09/2020   Procedure: A-FLUTTER ABLATION;  Surgeon: Thompson Grayer, MD;  Location: [facility] CV LAB;  Service: Cardiovascular;  Laterality: N/A;   CARDIOVERSION N/A 09/27/2013   Procedure: CARDIOVERSION;  Surgeon: Lelon Perla, MD;  Location: [facility] ENDOSCOPY;  Service: Cardiovascular;  Laterality: N/A;   IR ANGIO EXTERNAL CAROTID SEL EXT CAROTID UNI R MOD SED  09/26/2020   IR ANGIO INTRA EXTRACRAN SEL COM CAROTID INNOMINATE BILAT MOD SED  09/26/2020   IR ANGIO VERTEBRAL SEL VERTEBRAL UNI R MOD SED  09/26/2020   IR IVC FILTER PLMT / S&I /IMG GUID/MOD SED  10/08/2020   IR IVUS EACH ADDITIONAL NON CORONARY VESSEL  11/02/2020   IR PTA VENOUS EXCEPT DIALYSIS CIRCUIT  11/02/2020   IR RADIOLOGIST EVAL & MGMT  01/17/2021   IR THROMBECT VENO MECH MOD SED  10/27/2020   IR THROMBECT VENO MECH MOD SED  11/02/2020   IR US GUIDE VASC  ACCESS LEFT  10/27/2020   IR US GUIDE VASC ACCESS RIGHT  09/26/2020   IR US GUIDE VASC ACCESS RIGHT  11/02/2020   IR VENO/EXT/UNI RIGHT  11/02/2020   IR VENOCAVAGRAM IVC  10/27/2020   RETINAL DETACHMENT SURGERY     right knee arthroscopy     TEE WITHOUT CARDIOVERSION N/A 09/27/2013   Procedure: TRANSESOPHAGEAL ECHOCARDIOGRAM (TEE);  Surgeon: Lelon Perla, MD;  Location: [facility] ENDOSCOPY;  Service: Cardiovascular;  Laterality: N/A;   WRIST SURGERY      There were no vitals filed for this visit.   Subjective Assessment - 04/03/21 1122     Subjective  Pt reports partial Achilles tendon tear    Pertinent History ICH of brainstem (complicated by hydrocephalus, DVTs, and tracheostomy).  PMH:  a-fib    Patient Stated Goals use L side of body better (decrease ataxia), stay active and heal brain    Currently in Pain? Yes    Pain Score 2     Pain Orientation Right    Pain Descriptors / Indicators Aching    Pain Type Acute pain    Pain Onset In the past 7 days    Pain Frequency Intermittent    Aggravating Factors  walking  Pain Relieving Factors rest                     Treatment: closed chain shoulder flexion, abduction and chest press with foam noodle, approx 20 reps each, min v.c and facilitation for UE control. Copying small peg design with left and right UE's on tabletop, min difficulty with RUE, min-mod difficulty with LUE, min v.c, removing pegs with bilateral UE's simultaneously, min v.c Increased time required. Discussed recent injury with pt/ caregiver, therapy performed in seated awaiting clarification of precautions.               OT Short Term Goals - 03/12/21 0859       OT SHORT TERM GOAL #1   Title Pt/caregiver will be independent with initial HEP for LUE coordination and R hand strength.--    Time 4    Period Weeks    Status Achieved   needs continued reinforcement, pt is not using consistently   Target Date 03/02/21      OT SHORT TERM GOAL #2    Title Pt/caregiver will be independent with vision HEP and visual compensation strategies.    Time 4    Period Weeks    Status Achieved   Pt/ family verbalize understanding of vision HEP, needs continued reinforcement of compensatory strategeis- pt is not performing head turns     OT SHORT TERM GOAL #3   Title Pt will improve LUE coordination for ADLs as shown by improving score on box and blocks test by at least 6.    Baseline L-21 blocks    Time 4    Period Weeks    Status On-going   25 blocks.  03/12/21:  24 blocks     OT SHORT TERM GOAL #4   Title Pt will perform simple snack prep/home maintenance task from w/c level mod I.    Time 4    Period Weeks    Status Achieved   met per pt's wife     OT SHORT TERM GOAL #5   Title Pt will verbalize understanding of compensation strategies for sensory deficts for incr safety.    Time 4    Period Weeks    Status Achieved   Pt verbalized that he has to be careful about temperature, needs additional reinforcement.  03/12/21:  met     OT SHORT TERM GOAL #6   Title Pt will improve R hand grip stength to at least 32lbs to assist with ADLs/opening containers.    Time 4    Period Weeks    Status Achieved   33.5     OT SHORT TERM GOAL #7   Title --               OT Long Term Goals - 03/12/21 0904       OT LONG TERM GOAL #1   Title Pt will be independent with updated HEP--    Time 12    Period Weeks    Status On-going      OT LONG TERM GOAL #2   Title Pt will improve L hand coordination for ADLs as shown by completing 9-hole peg test in less than 70sec.    Baseline 107sec    Time 12    Period Weeks    Status On-going   03/12/21:  78sec, 78sec (with multiple drops)     OT LONG TERM GOAL #3   Title Pt will be mod I with toileting.  Time 12    Period Weeks    Status On-going   03/12/21:  supervision/CGA     OT LONG TERM GOAL #4   Title Pt will perform simple snack prep/home maintenance tasks in standing with supervision.     Time 12    Period Weeks    Status On-going      OT LONG TERM GOAL #5   Title Pt will demo good safety awareness/compensation for sensory deficits and attention to LUE during functional tasks and transfers without cueing.    Time 12    Period Weeks    Status On-going      OT LONG TERM GOAL #6   Title Pt will perform simple environmental scanning/navigation with at least 90% accuracy for incr safety.    Time 12    Period Weeks    Status On-going   03/12/21:  70% with CGA for balance/ambulation     OT LONG TERM GOAL #7   Title Pt will perform all basic ADLS with no more than supervision.    Time 12    Period Weeks    Status On-going   03/12/21:  min A-supervision                  Plan - 04/03/21 1115     Clinical Impression Statement Pt arrives today wearing a cam boot on his RLE due to partial Achilles tendon tear. Therapist limited activities to seated today.Pt demonstrates improving UE coordination and control.    OT Occupational Profile and History Detailed Assessment- Review of Records and additional review of physical, cognitive, psychosocial history related to current functional performance    Occupational performance deficits (Please refer to evaluation for details): ADL's;IADL's;Work;Leisure;Social Participation    Body Structure / Function / Physical Skills ADL;Strength;Balance;Proprioception;UE functional use;IADL;Endurance;Vision;Mobility;Coordination;Decreased knowledge of precautions;FMC;GMC;Sensation    Cognitive Skills Attention;Safety Awareness;Memory;Perception;Problem Solve    Rehab Potential Good    Clinical Decision Making Several treatment options, min-mod task modification necessary    Comorbidities Affecting Occupational Performance: May have comorbidities impacting occupational performance    Modification or Assistance to Complete Evaluation  Min-Moderate modification of tasks or assist with assess necessary to complete eval    OT Frequency 3x / week    updated frequency may decrease after 4 weeks   OT Duration 12 weeks   written for 12 weeks however may d/c after 8 depenent upon progress.   OT Treatment/Interventions Self-care/ADL training;Moist Heat;Fluidtherapy;DME and/or AE instruction;Balance training;Therapeutic activities;Aquatic Therapy;Ultrasound;Therapeutic exercise;Cognitive remediation/compensation;Visual/perceptual remediation/compensation;Functional Mobility Training;Neuromuscular education;Cryotherapy;Energy conservation;Manual Therapy;Patient/family education    Plan continue with coordination, UE control, await precautions for RLE    Consulted and Agree with Plan of Care Family member/caregiver;Patient    Family Member Consulted caregiver Claiborne Billings             Patient will benefit from skilled therapeutic intervention in order to improve the following deficits and impairments:   Body Structure / Function / Physical Skills: ADL, Strength, Balance, Proprioception, UE functional use, IADL, Endurance, Vision, Mobility, Coordination, Decreased knowledge of precautions, FMC, GMC, Sensation Cognitive Skills: Attention, Safety Awareness, Memory, Perception, Problem Solve     Visit Diagnosis: Muscle weakness (generalized)  Ataxia  Other lack of coordination  Frontal lobe and executive function deficit  Other disturbances of skin sensation  Other symptoms and signs involving cognitive functions following other nontraumatic intracranial hemorrhage    Problem List Patient Active Problem List   Diagnosis Date Noted   Thrombosis    Sleep disturbance    Dysphagia,  post-stroke    PAF (paroxysmal atrial fibrillation) ([place])    Acute pulmonary embolism without acute cor pulmonale (HCC)    Malnutrition of moderate degree 11/04/2020   Tracheostomy care ([place])    Pressure injury of skin 10/23/2020   Intracranial hemorrhage (HCC)    Atrial fibrillation (HCC)    Prediabetes    Leukocytosis    Acute blood loss anemia     Hypernatremia    ICH (intracerebral hemorrhage) ([place]) 09/25/2020   Unilateral primary osteoarthritis, left knee 09/26/2016   Atrial flutter ([place]) 09/25/2013   Near syncope 09/25/2013    Brystal Kildow, OT/L 04/03/2021, 11:24 AM  [facility] [address] [place], [place], [address] Phone: [phone]   Fax:  [phone]  Name: JMICHAEL GILLE MRN: [id number] Date of Birth: 02/05/1956

## 2021-04-03 NOTE — Telephone Encounter (Addendum)
Walter Reichert, MD  Reesa Chew, CMA  Please let patient know that they have sleep apnea and recommend treating with CPAP.    Please order an auto CPAP from 4-15cm H2O with heated humidity and mask of choice.  Order overnight pulse ox on CPAP.  Followup with me in 6 weeks.  The patient has been notified of the result and verbalized understanding.  All questions (if any) were answered. Latrelle Dodrill, CMA 04/03/2021 4:37 PM     Upon patient request DME selection is [facility] Patient understands he will be contacted by [facility] to set up his cpap. Patient understands to call if [facility] does not contact him with new setup in a timely manner. Patient understands they will be called once confirmation has been received from [facility] that they have received their new machine to schedule 10 week follow up appointment.   [facility] notified of new cpap order  Please add to [place] Patient was grateful for the call and thanked me

## 2021-04-04 ENCOUNTER — Telehealth: Payer: Self-pay

## 2021-04-04 NOTE — Addendum Note (Signed)
Addended byDurenda Hurt on: 04/04/2021 09:12 AM   Modules accepted: Orders

## 2021-04-04 NOTE — Telephone Encounter (Signed)
I called and sw Walter [name] and advised that per Dr. Lajoyce Corners that the pt has a partial achilles tear on the right and that he is wearing a fx boot with heels lits on this side. He may be WTBAT with the fx boot on. Will call with any other questions.

## 2021-04-04 NOTE — Telephone Encounter (Signed)
Return call from Dr. Lajoyce Corners.  Patient is WBAT while in walking boot.  Has heel lift due to partial R achilles tear.

## 2021-04-04 NOTE — Telephone Encounter (Signed)
Trey Paula with [facility] would like to know if patient has any restrictions for his right ankle for WB and ROM?  CB# [phone].  Please advise.  Thank you.

## 2021-04-05 ENCOUNTER — Ambulatory Visit: Payer: Medicare Other | Admitting: Occupational Therapy

## 2021-04-05 ENCOUNTER — Encounter: Payer: Self-pay | Admitting: Occupational Therapy

## 2021-04-05 ENCOUNTER — Ambulatory Visit: Payer: Medicare Other

## 2021-04-05 ENCOUNTER — Other Ambulatory Visit: Payer: Self-pay

## 2021-04-05 DIAGNOSIS — R278 Other lack of coordination: Secondary | ICD-10-CM

## 2021-04-05 DIAGNOSIS — R270 Ataxia, unspecified: Secondary | ICD-10-CM

## 2021-04-05 DIAGNOSIS — R41841 Cognitive communication deficit: Secondary | ICD-10-CM

## 2021-04-05 DIAGNOSIS — R2681 Unsteadiness on feet: Secondary | ICD-10-CM

## 2021-04-05 DIAGNOSIS — R208 Other disturbances of skin sensation: Secondary | ICD-10-CM

## 2021-04-05 NOTE — Therapy (Signed)
[facility] [address] [place], [place], [address] Phone: [phone]   Fax:  [phone]  Occupational Therapy Treatment  Patient Details  Name: Walter [name] MRN: [id number] Date of Birth: 1955/11/10 Referring Provider (OT): Dr. Alysia Penna   Encounter Date: 04/05/2021   OT End of Session - 04/05/21 1508     Visit Number 27    Number of Visits 80    Date for OT Re-Evaluation 04/27/21    Authorization Type Medicare Part A & B- need to add [place] - Visit Number 34    Authorization - Number of Visits 30    Progress Note Due on Visit 63    OT Start Time 1403    OT Stop Time 1450    OT Time Calculation (min) 47 min    Activity Tolerance Patient tolerated treatment well    Behavior During Therapy [facility] for tasks assessed/performed             Past Medical History:  Diagnosis Date   Atrial flutter ([place])    s/p ablation   DVT (deep venous thrombosis) ([place])    Near syncope 09/25/2013   Paroxysmal atrial fibrillation ([place])    Stroke ([place])    initially hemorragic (not on [place]) followed by subsequent embolic stroke    Past Surgical History:  Procedure Laterality Date   A-FLUTTER ABLATION N/A 03/09/2020   Procedure: A-FLUTTER ABLATION;  Surgeon: Thompson Grayer, MD;  Location: [facility] CV LAB;  Service: Cardiovascular;  Laterality: N/A;   CARDIOVERSION N/A 09/27/2013   Procedure: CARDIOVERSION;  Surgeon: Lelon Perla, MD;  Location: [facility] ENDOSCOPY;  Service: Cardiovascular;  Laterality: N/A;   IR ANGIO EXTERNAL CAROTID SEL EXT CAROTID UNI R MOD SED  09/26/2020   IR ANGIO INTRA EXTRACRAN SEL COM CAROTID INNOMINATE BILAT MOD SED  09/26/2020   IR ANGIO VERTEBRAL SEL VERTEBRAL UNI R MOD SED  09/26/2020   IR IVC FILTER PLMT / S&I /IMG GUID/MOD SED  10/08/2020   IR IVUS EACH ADDITIONAL NON CORONARY VESSEL  11/02/2020   IR PTA VENOUS EXCEPT DIALYSIS CIRCUIT  11/02/2020   IR RADIOLOGIST EVAL & MGMT   01/17/2021   IR THROMBECT VENO MECH MOD SED  10/27/2020   IR THROMBECT VENO MECH MOD SED  11/02/2020   IR US GUIDE VASC ACCESS LEFT  10/27/2020   IR US GUIDE VASC ACCESS RIGHT  09/26/2020   IR US GUIDE VASC ACCESS RIGHT  11/02/2020   IR VENO/EXT/UNI RIGHT  11/02/2020   IR VENOCAVAGRAM IVC  10/27/2020   RETINAL DETACHMENT SURGERY     right knee arthroscopy     TEE WITHOUT CARDIOVERSION N/A 09/27/2013   Procedure: TRANSESOPHAGEAL ECHOCARDIOGRAM (TEE);  Surgeon: Lelon Perla, MD;  Location: [facility] ENDOSCOPY;  Service: Cardiovascular;  Laterality: N/A;   WRIST SURGERY      There were no vitals filed for this visit.   Subjective Assessment - 04/05/21 1456     Subjective  Patient reports no pain with walking in boot    Patient is accompanied by: Family member    Pertinent History ICH of brainstem (complicated by hydrocephalus, DVTs, and tracheostomy).  PMH:  a-fib    Limitations fall risk, ataxia, impulsivity    Patient Stated Goals use L side of body better (decrease ataxia), stay active and heal brain    Currently in Pain? No/denies    Pain Score 0-No pain  OT Treatments/Exercises (OP) - 04/05/21 0001       ADLs   ADL Comments Patient and wife asking about rolling walker versus rollator.  Discussed pro's and con's.  Patient's wife feels walker is cumbersome - appears difficult to move.  It may be beneficial to allow patient to feel rollator - as gretaer degree of freedom is possible and more control is needed for successful use.      Visual/Perceptual Exercises   Other Exercises Patient is currently closing right eye versus looking through prism during this session.  Patient reports temporary prism is distracting - and wonders if permanent prism may be more beneficial.      Neurological Re-education Exercises   Other Exercises 1 Neuromuscular re-ed to address closed chain whole body movement - working on graded control to address ataxic movements with  limits of freedom - 4 point to side sit.  Rechecked short term goal relating to box and blocks - goal improvement since initial test - but goal not met.  Patient weight shifting strongly toward right with this task, and movement excessive in LUE.                      OT Short Term Goals - 04/05/21 1436       OT SHORT TERM GOAL #1   Title Pt/caregiver will be independent with initial HEP for LUE coordination and R hand strength.--    Time 4    Period Weeks    Status Achieved   needs continued reinforcement, pt is not using consistently   Target Date 03/02/21      OT SHORT TERM GOAL #2   Title Pt/caregiver will be independent with vision HEP and visual compensation strategies.    Time 4    Period Weeks    Status Achieved   Pt/ family verbalize understanding of vision HEP, needs continued reinforcement of compensatory strategeis- pt is not performing head turns     OT SHORT TERM GOAL #3   Title Pt will improve LUE coordination for ADLs as shown by improving score on box and blocks test by at least 6.    Baseline L-21 blocks    Time 4    Period Weeks    Status On-going   25 blocks.  03/12/21:  24 blocks     OT SHORT TERM GOAL #4   Title Pt will perform simple snack prep/home maintenance task from w/c level mod I.    Time 4    Period Weeks    Status Achieved   met per pt's wife     OT SHORT TERM GOAL #5   Title Pt will verbalize understanding of compensation strategies for sensory deficts for incr safety.    Time 4    Period Weeks    Status Achieved   Pt verbalized that he has to be careful about temperature, needs additional reinforcement.  03/12/21:  met     OT SHORT TERM GOAL #6   Title Pt will improve R hand grip stength to at least 32lbs to assist with ADLs/opening containers.    Time 4    Period Weeks    Status Achieved   33.5     OT SHORT TERM GOAL #7   Title --               OT Long Term Goals - 04/05/21 1511       OT LONG TERM GOAL #1   Title  Pt will  be independent with updated HEP--    Time 12    Period Weeks    Status On-going      OT LONG TERM GOAL #2   Title Pt will improve L hand coordination for ADLs as shown by completing 9-hole peg test in less than 70sec.    Baseline 107sec    Time 12    Period Weeks    Status On-going   03/12/21:  78sec, 78sec (with multiple drops)     OT LONG TERM GOAL #3   Title Pt will be mod I with toileting.    Time 12    Period Weeks    Status On-going   03/12/21:  supervision/CGA     OT LONG TERM GOAL #4   Title Pt will perform simple snack prep/home maintenance tasks in standing with supervision.    Time 12    Period Weeks    Status On-going      OT LONG TERM GOAL #5   Title Pt will demo good safety awareness/compensation for sensory deficits and attention to LUE during functional tasks and transfers without cueing.    Time 12    Period Weeks    Status On-going      OT LONG TERM GOAL #6   Title Pt will perform simple environmental scanning/navigation with at least 90% accuracy for incr safety.    Time 12    Period Weeks    Status On-going   03/12/21:  70% with CGA for balance/ambulation     OT LONG TERM GOAL #7   Title Pt will perform all basic ADLS with no more than supervision.    Time 12    Period Weeks    Status On-going   03/12/21:  min A-supervision                  Plan - 04/05/21 1509     Clinical Impression Statement Patient somewhat limited by onset of partial achilles rupture and cam boot.  Patient still able to participate in therapy and is WBAT.  Patient continues to be challenged by ataxia, and poor graded motor control.    OT Occupational Profile and History Detailed Assessment- Review of Records and additional review of physical, cognitive, psychosocial history related to current functional performance    Occupational performance deficits (Please refer to evaluation for details): ADL's;IADL's;Work;Leisure;Social Participation    Body Structure /  Function / Physical Skills ADL;Strength;Balance;Proprioception;UE functional use;IADL;Endurance;Vision;Mobility;Coordination;Decreased knowledge of precautions;FMC;GMC;Sensation    Cognitive Skills Attention;Safety Awareness;Memory;Perception;Problem Solve    Rehab Potential Good    Clinical Decision Making Several treatment options, min-mod task modification necessary    Comorbidities Affecting Occupational Performance: May have comorbidities impacting occupational performance    Modification or Assistance to Complete Evaluation  Min-Moderate modification of tasks or assist with assess necessary to complete eval    OT Frequency 3x / week    OT Duration 12 weeks    OT Treatment/Interventions Self-care/ADL training;Moist Heat;Fluidtherapy;DME and/or AE instruction;Balance training;Therapeutic activities;Aquatic Therapy;Ultrasound;Therapeutic exercise;Cognitive remediation/compensation;Visual/perceptual remediation/compensation;Functional Mobility Training;Neuromuscular education;Cryotherapy;Energy conservation;Manual Therapy;Patient/family education    Plan continue with coordination, UE control, await precautions for RLE    Consulted and Agree with Plan of Care Family member/caregiver;Patient    Family Member Consulted wife [name]             Patient will benefit from skilled therapeutic intervention in order to improve the following deficits and impairments:   Body Structure / Function / Physical Skills: ADL, Strength, Balance, Proprioception, UE functional use,  IADL, Endurance, Vision, Mobility, Coordination, Decreased knowledge of precautions, [place], GMC, Sensation Cognitive Skills: Attention, Safety Awareness, Memory, Perception, Problem Solve     Visit Diagnosis: Ataxia  Other lack of coordination  Other disturbances of skin sensation  Unsteadiness on feet    Problem List Patient Active Problem List   Diagnosis Date Noted   Thrombosis    Sleep disturbance    Dysphagia,  post-stroke    PAF (paroxysmal atrial fibrillation) ([place])    Acute pulmonary embolism without acute cor pulmonale (HCC)    Malnutrition of moderate degree 11/04/2020   Tracheostomy care ([place])    Pressure injury of skin 10/23/2020   Intracranial hemorrhage (HCC)    Atrial fibrillation (HCC)    Prediabetes    Leukocytosis    Acute blood loss anemia    Hypernatremia    ICH (intracerebral hemorrhage) ([place]) 09/25/2020   Unilateral primary osteoarthritis, left knee 09/26/2016   Atrial flutter ([place]) 09/25/2013   Near syncope 09/25/2013    Mariah Milling, OT/L 04/05/2021, 3:12 PM  [facility] [address] [place], [place], [address] Phone: [phone]   Fax:  [phone]  Name: JOSETH WEIGEL MRN: [id number] Date of Birth: [birth year]

## 2021-04-05 NOTE — Therapy (Signed)
[facility] [address] [place], [place], [address] Phone: [phone]   Fax:  [phone]  Speech Language Pathology Treatment  Patient Details  Name: Walter [name] MRN: [id number] Date of Birth: 1956-04-25 Referring Provider (SLP): Charlett Blake, MD   Encounter Date: 04/05/2021   End of Session - 04/05/21 1532     Visit Number 28    Number of Visits 91    Date for SLP Re-Evaluation 04/20/21    Authorization Type Medicare    SLP Start Time 1533    SLP Stop Time  3299    SLP Time Calculation (min) 42 min    Activity Tolerance Patient tolerated treatment well             Past Medical History:  Diagnosis Date   Atrial flutter ([place])    s/p ablation   DVT (deep venous thrombosis) ([place])    Near syncope 09/25/2013   Paroxysmal atrial fibrillation ([place])    Stroke [facility])    initially hemorragic (not on [facility]) followed by subsequent embolic stroke    Past Surgical History:  Procedure Laterality Date   A-FLUTTER ABLATION N/A 03/09/2020   Procedure: A-FLUTTER ABLATION;  Surgeon: Thompson Grayer, MD;  Location: [facility];  Service: Cardiovascular;  Laterality: N/A;   CARDIOVERSION N/A 09/27/2013   Procedure: CARDIOVERSION;  Surgeon: Lelon Perla, MD;  Location: [facility] ENDOSCOPY;  Service: Cardiovascular;  Laterality: N/A;   IR ANGIO EXTERNAL CAROTID SEL EXT CAROTID UNI R MOD SED  09/26/2020   IR ANGIO INTRA EXTRACRAN SEL COM CAROTID INNOMINATE BILAT MOD SED  09/26/2020   IR ANGIO VERTEBRAL SEL VERTEBRAL UNI R MOD SED  09/26/2020   IR IVC FILTER PLMT / S&I /IMG GUID/MOD SED  10/08/2020   IR IVUS EACH ADDITIONAL NON CORONARY VESSEL  11/02/2020   IR PTA VENOUS EXCEPT DIALYSIS CIRCUIT  11/02/2020   IR RADIOLOGIST EVAL & MGMT  01/17/2021   IR THROMBECT VENO MECH MOD SED  10/27/2020   IR THROMBECT VENO MECH MOD SED  11/02/2020   IR US GUIDE VASC ACCESS LEFT  10/27/2020   IR US GUIDE VASC ACCESS RIGHT  09/26/2020   IR US  GUIDE VASC ACCESS RIGHT  11/02/2020   IR VENO/EXT/UNI RIGHT  11/02/2020   IR VENOCAVAGRAM IVC  10/27/2020   RETINAL DETACHMENT SURGERY     right knee arthroscopy     TEE WITHOUT CARDIOVERSION N/A 09/27/2013   Procedure: TRANSESOPHAGEAL ECHOCARDIOGRAM (TEE);  Surgeon: Lelon Perla, MD;  Location: [facility] ENDOSCOPY;  Service: Cardiovascular;  Laterality: N/A;   WRIST SURGERY      There were no vitals filed for this visit.   Subjective Assessment - 04/05/21 1547     Subjective "reading and writing skills have regressed"    Currently in Pain? No/denies                   ADULT SLP TREATMENT - 04/05/21 1531       General Information   Behavior/Cognition Alert;Cooperative;Pleasant mood;Requires cueing;Impulsive      Treatment Provided   Treatment provided Cognitive-Linquistic      Cognitive-Linquistic Treatment   Treatment focused on Cognition;Dysarthria    Skilled Treatment Pt recalled plan to shift focus from dysarthria to cognition, as pt has exhibited significant improvements in speech intelligibility. Pt reports reading and writing have regressed secondary to double vision and ataxia. SLP trialed screener for following written instructions, in which pt noted to begin at number  6 and work in reverse and then return to complete number 7. Pt also drew clock too small, left out numbers, and labeled wrong time. In review, pt explained rationale for limited numbers due to small circle and intially confirmed his time as accurate. With extra time, pt identified and corrected error. SLP discussed atypical pattern and errors as result of impulsivity and reduced problem solving. Pt did attempt to explain away pattern as task was not "comprehensive." SLP emphasized typical systematic, structured process, including functional examples for following recipes and building a home. Pt verbalized understanding. Good accuracy exhibited on other components of task. SLP targeted other functional attention  and executive functioning tasks with emphasis on "plan and then execute." Pt exhibited good processing with cued slow rate and step by step sequencing.      Assessment / Recommendations / Plan   Plan Continue with current plan of care      Progression Toward Goals   Progression toward goals Progressing toward goals              SLP Education - 04/05/21 2049     Education Details plan then execute/act    Person(s) Educated Patient    Methods Explanation;Demonstration;Handout    Comprehension Verbalized understanding;Returned demonstration;Need further instruction              SLP Short Term Goals - 02/21/21 1552       SLP SHORT TERM GOAL #1   Title Pt will complete dysarthria HEP with occasional min A over 2 sessions    Status Not Met      SLP SHORT TERM GOAL #2   Title Pt will produce loud /a/ or "hey!" with at least high 80s dB average over 2 sessions    Status Not Met      SLP SHORT TERM GOAL #3   Title Pt will generate abdominal breathing in 16/20 answers to SLP questions over 2 sessions    Baseline 01-18-21    Status Partially Met      SLP SHORT TERM GOAL #4   Title Pt will demonstrate dysarthria compensations in 10 minute simple to mod complex conversation to be 75% intelligibile with occasional min A over 2 sessions    Status Not Met      SLP SHORT TERM GOAL #5   Title Pt will complete formal cognitive linguistic assessment in first 1-2 ST sessions    Status Achieved      SLP SHORT TERM GOAL #6   Title Pt will use memory and attention compensations for appointments, medicine management, and other daily activities with occasional min A over 2 sessions    Status Not Met              SLP Long Term Goals - 04/05/21 1550       SLP LONG TERM GOAL #1   Title Pt will complete dysarthria HEP with rare min A over 2 sessions    Baseline 03-20-21, 03-22-21    Status Achieved      SLP LONG TERM GOAL #2   Title Pt will demonstrate dysarthria compensations in  10 minute simple conversation to be 90% intelligibile with rare min A over 3 sessions    Baseline 03-22-21; 03-29-21, 04-03-21    Time 8    Period Weeks    Status Achieved      SLP LONG TERM GOAL #3   Title Pt will demo improved sustained and selective attention during therapeutic tasks with less than 3 cues  for active listening during 2 sessions    Baseline 04-05-21    Time 2    Period Weeks    Status Achieved    Target Date 04/20/21      SLP LONG TERM GOAL #4   Title Pt will demonstrate anticipatory awareness of impulsivity with min cues to modify behaviors during 2 sessions    Time 2    Period Weeks    Status On-going   and ongoing   Target Date 04/20/21      SLP LONG TERM GOAL #5   Title Pt will report reduced frustration and improved communication effectiveness via PROM by 2 points at last ST sessions    Baseline CES: 15.5 & V-RQOL:35    Time 8    Period Weeks    Status On-going    Target Date 04/20/21      SLP LONG TERM GOAL #6   Title Pt will selectively attend to cognitive linguistic task for 5 minutes with 3 or less cues for redirection over 3 sessions    Baseline 04-03-21    Time 8    Period Weeks    Status On-going    Target Date 04/20/21              Plan - 04/05/21 2049     Clinical Impression Statement "[name]" presents for OPST intervention secondary to stroke in March 2022.  Pt agrees to shift focus from speech to cognition. SLP targeted attention and executive functioning this session, which appeared to be impacted by impulsivity. See daily note for details. Skilled ST is cont'd warranted to address cognition to maximize communication effectiveness and optimize functional independence and safety at home.    Speech Therapy Frequency 2x / week    Duration --   16 weeks   Treatment/Interventions Cueing hierarchy;Functional tasks;Patient/family education;Cognitive reorganization;Multimodal communcation approach;Language facilitation;Compensatory  techniques;Internal/external aids;SLP instruction and feedback    Potential to Achieve Goals Fair    Potential Considerations Ability to learn/carryover information;Cooperation/participation level    SLP Home Exercise Plan provided    Consulted and Agree with Plan of Care Patient             Patient will benefit from skilled therapeutic intervention in order to improve the following deficits and impairments:   Cognitive communication deficit    Problem List Patient Active Problem List   Diagnosis Date Noted   Thrombosis    Sleep disturbance    Dysphagia, post-stroke    PAF (paroxysmal atrial fibrillation) ([place])    Acute pulmonary embolism without acute cor pulmonale (HCC)    Malnutrition of moderate degree 11/04/2020   Tracheostomy care ([place])    Pressure injury of skin 10/23/2020   Intracranial hemorrhage ([facility])    Atrial fibrillation ([place])    Prediabetes    Leukocytosis    Acute blood loss anemia    Hypernatremia    ICH (intracerebral hemorrhage) ([place]) 09/25/2020   Unilateral primary osteoarthritis, left knee 09/26/2016   Atrial flutter ([place]) 09/25/2013   Near syncope 09/25/2013    Alinda Deem, [name] 04/05/2021, 8:53 PM  [facility] [address] [place], [place], [address] Phone: [phone]   Fax:  [phone]   Name: SANDEEP RADELL MRN: [id number] Date of Birth: Mar 17, 1956

## 2021-04-06 ENCOUNTER — Ambulatory Visit (HOSPITAL_BASED_OUTPATIENT_CLINIC_OR_DEPARTMENT_OTHER): Payer: Medicare Other | Attending: Physical Medicine & Rehabilitation | Admitting: Physical Therapy

## 2021-04-06 ENCOUNTER — Encounter (HOSPITAL_BASED_OUTPATIENT_CLINIC_OR_DEPARTMENT_OTHER): Payer: Self-pay | Admitting: Physical Therapy

## 2021-04-06 DIAGNOSIS — M25561 Pain in right knee: Secondary | ICD-10-CM | POA: Insufficient documentation

## 2021-04-06 DIAGNOSIS — R278 Other lack of coordination: Secondary | ICD-10-CM | POA: Insufficient documentation

## 2021-04-06 DIAGNOSIS — R2689 Other abnormalities of gait and mobility: Secondary | ICD-10-CM | POA: Insufficient documentation

## 2021-04-06 DIAGNOSIS — R262 Difficulty in walking, not elsewhere classified: Secondary | ICD-10-CM | POA: Insufficient documentation

## 2021-04-06 DIAGNOSIS — R270 Ataxia, unspecified: Secondary | ICD-10-CM | POA: Diagnosis not present

## 2021-04-06 DIAGNOSIS — R293 Abnormal posture: Secondary | ICD-10-CM | POA: Insufficient documentation

## 2021-04-06 DIAGNOSIS — G8929 Other chronic pain: Secondary | ICD-10-CM | POA: Diagnosis present

## 2021-04-06 DIAGNOSIS — R2681 Unsteadiness on feet: Secondary | ICD-10-CM | POA: Diagnosis present

## 2021-04-06 DIAGNOSIS — M6281 Muscle weakness (generalized): Secondary | ICD-10-CM | POA: Insufficient documentation

## 2021-04-06 DIAGNOSIS — R471 Dysarthria and anarthria: Secondary | ICD-10-CM | POA: Diagnosis present

## 2021-04-06 NOTE — Therapy (Signed)
OUTPATIENT PHYSICAL THERAPY TREATMENT NOTE/PROGRESS NOTE Patient Name: Walter [name] MRN: [id number] DOB:[birth year], 65 y.o., male Today's Date: 04/06/2021  PCP: Alroy Dust, L.Marlou Sa, MD REFERRING PROVIDER: Alroy Dust, Carlean Jews.Marlou Sa, MD  Physical Therapy 10th visit Progress Note   Dates of Reporting Period:12/26/20-04/03/21  See Note below for Objective Data and Assessment of Progress/Goals.     PT End of Session - 04/06/21 1201     Visit Number 31    Number of Visits 57    Date for PT Re-Evaluation 05/04/21    PT Start Time 1031    PT Stop Time 1116    PT Time Calculation (min) 45 min    Equipment Utilized During Treatment Gait belt    Activity Tolerance Patient tolerated treatment well    Behavior During Therapy WFL for tasks assessed/performed                   Past Medical History:  Diagnosis Date   Atrial flutter ([place])    s/p ablation   DVT (deep venous thrombosis) (HCC)    Near syncope 09/25/2013   Paroxysmal atrial fibrillation ([place])    Stroke [facility])    initially hemorragic (not on [place]) followed by subsequent embolic stroke   Past Surgical History:  Procedure Laterality Date   A-FLUTTER ABLATION N/A 03/09/2020   Procedure: A-FLUTTER ABLATION;  Surgeon: Thompson Grayer, MD;  Location: [place] [facility];  Service: Cardiovascular;  Laterality: N/A;   CARDIOVERSION N/A 09/27/2013   Procedure: CARDIOVERSION;  Surgeon: Lelon Perla, MD;  Location: [facility] ENDOSCOPY;  Service: Cardiovascular;  Laterality: N/A;   IR ANGIO EXTERNAL CAROTID SEL EXT CAROTID UNI R MOD SED  09/26/2020   IR ANGIO INTRA EXTRACRAN SEL COM CAROTID INNOMINATE BILAT MOD SED  09/26/2020   IR ANGIO VERTEBRAL SEL VERTEBRAL UNI R MOD SED  09/26/2020   IR IVC FILTER PLMT / S&I /IMG GUID/MOD SED  10/08/2020   IR IVUS EACH ADDITIONAL NON CORONARY VESSEL  11/02/2020   IR PTA VENOUS EXCEPT DIALYSIS CIRCUIT  11/02/2020   IR RADIOLOGIST EVAL & MGMT  01/17/2021   IR THROMBECT VENO MECH MOD SED  10/27/2020   IR THROMBECT  VENO MECH MOD SED  11/02/2020   IR US GUIDE VASC ACCESS LEFT  10/27/2020   IR US GUIDE VASC ACCESS RIGHT  09/26/2020   IR US GUIDE VASC ACCESS RIGHT  11/02/2020   IR VENO/EXT/UNI RIGHT  11/02/2020   IR VENOCAVAGRAM IVC  10/27/2020   RETINAL DETACHMENT SURGERY     right knee arthroscopy     TEE WITHOUT CARDIOVERSION N/A 09/27/2013   Procedure: TRANSESOPHAGEAL ECHOCARDIOGRAM (TEE);  Surgeon: Lelon Perla, MD;  Location: [facility] ENDOSCOPY;  Service: Cardiovascular;  Laterality: N/A;   WRIST SURGERY     Patient Active Problem List   Diagnosis Date Noted   Thrombosis    Sleep disturbance    Dysphagia, post-stroke    PAF (paroxysmal atrial fibrillation) ([place])    Acute pulmonary embolism without acute cor pulmonale (HCC)    Malnutrition of moderate degree 11/04/2020   Tracheostomy care ([place])    Pressure injury of skin 10/23/2020   Intracranial hemorrhage (HCC)    Atrial fibrillation (HCC)    Prediabetes    Leukocytosis    Acute blood loss anemia    Hypernatremia    ICH (intracerebral hemorrhage) ([place]) 09/25/2020   Unilateral primary osteoarthritis, left knee 09/26/2016   Atrial flutter ([place]) 09/25/2013   Near syncope 09/25/2013    REFERRING DIAG: I61.3 (ICD-10-CM) -  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 (HCC) I69.193 (ICD-10-CM) - 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  TODAY'S TREATMENT:  04/06/21 Pt seen for aquatic therapy today.  Treatment took place in water 3.25-4.8 ft in depth at the [facility] pool. Temp of water was 92.Pt entered/exited the pool via stairs step to pattern independently with bilat rail.                            Amb progressed to hand buoys forward, backward (posterior chain activation) and side stepping/walking cues for increased step length, posture and weight shifting x 4 lengths of pool. Pt requiring min assist with backward amb.   Standing -hip add/abd and extension x 15 reps holding to pool wall. VC for increases speed to increase resistance.  Attempted completion with support of ue buoys but pt unable to maintain standing posture -hip extension 2 x 10 -water walking 4 widths holding 2 foam buoys at hips/submerged    Supine suspension Standing<>supine using 2 foam hand buoys x7 reps holding position x 10 second each. Vc and demo with min TC to gain position -in above position knees to chest with assist from therapist to maintain position 3 x 10 core strengthening  Aerobic capacity Using yellow noodle flutter kicking and frog kicking 6 lengths, pt able to maintain position indep "Swimming" 4 lengths breast and crawl stroke with cga for maintaining straight path.  Pt requires buoyancy for support and to offload joints with strengthening exercises. Viscosity of the water is needed for resistance of strengthening; water current perturbations provides challenge to standing balance unsupported, requiring increased core activation.    04/03/21  Seated on green disk, forward reaching to floor wih 2.5KG ball followed by [facility] reaching ea. For 20 reps, single movement Seated trunk flexion/extension over sitting disk, extension blocked by bolster, 15x with 2.5KG ball  Seated B rotation holding 2.5KG ball 20x, hip to hip  Seated LAQs on disk, 4# wts, with lat press alternating pattern, 1x20  Seated marching on disk, 4# wts with lat press alternating pattern 1x20  Supine SLR circled CW/CCW against red band 2 min duration ea. LE  5x STS in 23.5s         03/30/21             Pt seen for aquatic therapy today.  Treatment took place in water 3.25-4.8 ft in depth at the [facility] pool. Temp  of water was 92.Pt entered/exited the pool via stairs step to pattern independently with bilat rail.                           Amb progressed to yellow noodle forward, backward (posterior chain activation) and side stepping/walking cues for increased step length, posture and weight shifting x 4 lengths of pool.   Standing -step ups x 10 leading with L/R LE holding to wall for support -side stepping over water step R/L with bilat ue support decreasing to unilateral x 10.  Cues for lle control and proper placement, weight shifting and posture for improved balance control -hip add/abd and extension x 12  reps holding to pool wall. VC for increases speed to increase resistance.     Supine suspension Standing<>supine using 2 foam hand buoys x5 reps holding position x 10 second each -standing<>prone x5 holding position x 10 seconds. Flutter kicking x 5 min supported by Pt    Pt requires buoyancy for support and to offload joints with strengthening exercises. Viscosity of the water is needed for resistance of strengthening; water current perturbations provides challenge to standing balance unsupported, requiring increased core activation.     03/29/21  Seated on green disk, forward reaching to floor wih 2KG ball followed by [facility] reaching ea. For 20 reps, single movement Seated trunk flexion/extension over sitting disk, extension blocked by bolster, 15x.  Seated B rotation holding 2KG ball 20x, hip to hip  Seated LAQs on disk, 3# wts, with lat press alternating pattern, 2x15  Seated marching on disk, 3# wts with lat press alternating pattern  Supine SLR circled CW/CCW against red band 1 min duration ea. LE  Scifit L4 8'       03/27/21  Seated on green disk, forward reaching to floor wih 3KG ball followed by [facility] reaching ea. For 15 reps Seated trunk flexion/extension over sitting disk, extension blocked by bolster, 15x.  Seated B rotation holding 3KG ball 15x, hip to hip  Ambulation with QC  89f, SPC 316fthen SPCo[facility] Centerith 3# wt [id number] Min/ModA for balance corrections  From green disk, STS 15x with 3KG ball   Seated on green disk, OH press with 3KG ball              03/23/21             Pt seen for aquatic therapy today.  Treatment took place in water 3.25-4.8 ft [facility] Temp of [facility] was 92.          entered/exited the pool via stairs step to pattern with CGA with bilat rail.                         Gait training  initially with yellow noodle progressing to 2 foam hand buoy, forward, backward and side stepping/walking cues for increased step length, posture and weight shifting submerged from 3 to 4.8 ft.  Pt allowed increased indep/close SBA for self correction/regaining standing balance.   Balance  Standing decreasing BOS with and with hand buoys for support. Added manual perturbations. Completed in 3 ft and again in 4ft66fBalance/strengthening Step ups on water step submerged in 4.8 ft leading with each LE x 10 R/L.  Cues and min assist for positioning,posture and placement of lle. Toe raises with hand buoys for ue support in 4 ft 2 x 10 Heel raises 2 x 10.  Cues for weight shift and balance/posture. Mod assist to sba to gain position after LOB.   Suspension Core strengthening using hand buoy translating from sup to prone, assist needed to avoid rolling x 10   Pt requires buoyancy for support and to offload joints with strengthening exercises. Viscosity of the water is needed for resistance of strengthening; water current perturbations provides challenge to standing balance unsupported, requiring increased core activation.        03/22/21  Completed gait training x 230 ft with RW, PT facilitating at pelvis for improved weight shift and cues for controlled stepping to promote stability. Then progressed to gait training with negotiation around obstacles (cones) x 3 laps, cues  to stay aligned within walker, most challenge noted with  sharp turns. CGA throughout. Continued cues for improved turn and alignment with surface prior to descent with sit <> stand.   Seated on green air disc without UE support, completed lateral weight shift to R/L x 15 reps working on control and cues for posture/core activation. Then progressed to weight shift with reaching for cone in various directions x 10 reps.   Tall Kneeling on Mat with BUE support on Bench positioned in front of patient. CGA to get into/out of position. In tall kneeling completed hip hinges 2 x 10 reps working on improved return to upright and maintian hip extension, cues to keep posture upright. Then completed alternating UE raises x 10 reps bilaterally. Lateral reaching to PT's hand x 5 reps bilateral directions. Then trialed no UE support, difficulty maintaining tall kneeling position without assistance.    03/20/21 Seated trunk flexion/extension holding 5# ball 15X  Seated SB holding 5# ball 15x with PT stabilization dropping to each elbow  STS 15x with 5# ball SLR stabilization against blue t-band 60s duration x2 ea. LE DKTC over red physioball 15x   LTR 15x over red physioball  Scifit seat 20 arms 7 L2 x69mn   03/14/21  Seated marching, alt, 6# 15x per LE with latissimus press while seated on green disk to challenge seated balance  Seated LAQs 6# alt. 15x per LE with latissimus press while seated on green disk to challenge seated balance   STS with OH punch using yellow t-band 10x  SLR circles against red band, 60s each leg in supine position  Ambulation of 2075foutside of clinic with RW with CGA to accommodate facility fire drill  Supine/sit transitions 5x to ea. Side with random stopping points  Quadriped on elbows WS forward and back 10x  03/12/21 Ambulation of [id number] RW and CGA, cued to pause walking before pivot turning to focus on task at hand and break down tasks into individual  components to control ataxia Seated on green disk, alternating marching and  LAQs with lat press to activate core, 3# ankle weights, alternating pattern, 15 reps per LE Seated on disk, forward reaching using RW 10x              Seated core using disk and 3.3# ball using hip/shoulder tosses, chops and Vs for 10 reps per task  STS with OH reach once standing, 10 reps 03/09/21 Gait training: standing up from standard chair with large based quad cane walking up around cone (10 feet away) and sitting back down in chair: 2x, cues for 3step pattern and sequencing, min A to mod A required 1 x 230 feet with RW, CGA to min A with cues for smaller steps BBS performed 5x sit to stand performed.   03/01/2021 Pt seen for aquatic therapy today.  Treatment took place in water 3.25-4.8 ft [facility] Temp of water was 92. entered/exited the pool via stairs step to pattern independently with bilat rail.  Introduced pt to aquatic setting.                           Amb using water walker. forward, backward and side stepping/walking cues for increased step length, posture and weight shifting x 12 minutes. Pt requiring mod to min asst. Multiple LOB.  Seated -sit to stand transfers from water bench submerged to waist when standing.  Cueing for weight shift/execution. Initally  completed with water walker, progressed to thick yellow noodle.  Pt requiring min-cga for completion. 2 x 5 Marching x 10 LAQ x 10.  Standing -step ups x leading with L/R LE.  Cueing for decreased impulsiveness /improved concentration on finishing first movement before moving to next. -toe raises 2 x 10 Cueing for balance/slowing movements  Supine suspension Flutter kicking x 5 min supported by Pt  Pt requires buoyancy for support and to offload joints with strengthening exercises. Viscosity of the water is needed for resistance of strengthening; water current perturbations provides challenge to standing balance unsupported, requiring increased core activation.   02/28/21: Gait  training: 1 x 460 feet with tactile cues at pelvis to guide weight shifts cues for slower cadence and turning slowly Standing on floor: OH reaching and fwd reaching: alternating between reaches: 10x each Standing touching toes: 5x Gait training: chair to chair transfer with 180 deg turn: quad cane, CGA to min A: 4x  02/26/21: Gait training: 1 x 420 feet with tactile cues at pelvis to guide weight shifts cues for slower cadence and turning slowly Sit to stand: 10x no HHA Standing at countertop with wide BOS: moving basket with 20lbs from side to side to increase core engagement and weight shifts Picking up basket with 20lb from counter to chest: 5x Working on ankle strategy: standing heel and toe raises without HHA: very close guarding: 2 x 10  Standing contralateral reaching: 10x R and L    PATIENT EDUCATION: Education details: Scheduling more PT visits for October Person educated: Patient and caregiver Education method: Consulting civil engineer, Media planner, and Verbal cues Education comprehension: verbalized understanding     HOME EXERCISE PROGRAM: Refrain from ambulation due to R achilles injury  ASSESSMENT:   CLINICAL IMPRESSION:  Pt able to amb submerged without discomfort although demos some increased unsteadiness.  He is getting some heel strike and toe off right foot without complaint. Able to engage core throughout session today using hand buoys in supine suspension.  He does require some assistance to maintain position when exercising.  LE strength and endurance improving as evidenced by increase in lengths of pool covered with le propulsion without recovery periods.  Pt ended session today "swimming" 4 lengths.    REHAB POTENTIAL: Good   CLINICAL DECISION MAKING: Stable/uncomplicated  GOALS: Goals reviewed with patient? Yes   SHORT TERM GOALS:   STG Name Target Date Goal status  1 Patient will be able to perform chair to chair transfer with CGA/SBA with use of RW for safety to  improve independence. Baseline: min to mod A (12/26/20); CGA/minA (03/09/21) 01/23/2021 03/27/21 Able to transfer STS and stand pivot with SBA, goal met  2 Pt will be able to ambulate 115' with RW with CGA to min A with RW for safety to improve independence Baseline: min to mod A with RW 145'; 230 feet with RW and CGA to MIN A 01/23/2021 03/09/21 Goal met  3 Pt will be able to maintain standing balance for 30 sec to improve static balance Baseline: 10 sec before needing help due to LOB (12/26/20) 01/23/2021 02/05/21 able to stand with S for 30s, goal met  4. Pt will be able to perform sit to stand transfers with SBA to improve independence 04/06/21 Goal met 03/27/21  5. Pt will be able to perform 230 feet of walking with RW with SBA/CGA to improve household ambulation  04/06/21 Revised 03/09/21 04/03/21 UTA due to achilles partial tear    LONG TERM GOALS:   LTG  Name Target Date Goal status  1 Pt will demo >5 points improvement on Berg Balance Scale to improve static balance Baseline: 11/56 01/03/21 02/20/2021 INITIAL 03/09/21 BBS 21/56 04/03/21 UTA due to achilles partial tear  2 Pt will be able to ambulate >500 feet with RW and SBA to improve walking endurance and safety. Baseline: 145' with RW min to mod A; 230 feet with CGA to Min A (03/09/21)  05/18/21 Progressing continue  04/03/21 UTA due to achilles partial tear  3 Pt  will be able to perform gait speed improvement by 0.69ms to improve community ambulation. Baseline:0.268m (03/09/21) 05/16/21 Progressing continue 04/03/21 UTA due to achilles partial tear  4 Pt will demo 5 sec improvement in 5x sit to stand (with or without UE support) to improve functional strength Baseline: 5x sit to stand with bil UE 17 sec;  05/18/21 Progressing continue 04/03/21 Assessed at 23.5s, performance limited by R walking boot    PLAN: PT FREQUENCY: 2-3x/week   PT DURATION: 8 weeks   PLANNED INTERVENTIONS: Aquatic therapy, Therapeutic exercises, Therapeutic activity, Neuro  Muscular re-education, Balance training, Gait training, Patient/Family education, Joint mobilization, Stair training, Visual/preceptual remediation/compensation, Wheelchair mobility training, Cryotherapy, Moist heat, and Manual therapy   PLAN FOR NEXT SESSION:  Continue core and balance strengthening Visit Diagnosis: Ataxia  Unsteadiness on feet  Muscle weakness (generalized)  Other lack of coordination  Dysarthria and anarthria     Problem List Patient Active Problem List   Diagnosis Date Noted   Thrombosis    Sleep disturbance    Dysphagia, post-stroke    PAF (paroxysmal atrial fibrillation) (HCC)    Acute pulmonary embolism without acute cor pulmonale (HCC)    Malnutrition of moderate degree 11/04/2020   [place] care ([facility]   Pressure injury of skin 10/23/2020   Intracranial hemorrhage (HCC)    Atrial fibrillation (HCC)    Prediabetes    Leukocytosis    Acute blood loss anemia    Hypernatremia    ICH (intracerebral hemorrhage) (HCO'[place]03/28/2022   Unilateral primary osteoarthritis, left knee 09/26/2016   Atrial flutter ([place]03/28/2015   Near syncope 09/25/2013    MaVedia PereyraPT 04/06/2021, 1:54 PM  [id number]: [phone] Fax:  [phone]

## 2021-04-10 ENCOUNTER — Encounter: Payer: Medicare Other | Admitting: Occupational Therapy

## 2021-04-10 ENCOUNTER — Ambulatory Visit: Payer: Medicare Other

## 2021-04-10 ENCOUNTER — Other Ambulatory Visit: Payer: Self-pay

## 2021-04-10 ENCOUNTER — Ambulatory Visit: Payer: Medicare Other | Admitting: Occupational Therapy

## 2021-04-10 ENCOUNTER — Encounter: Payer: Self-pay | Admitting: Occupational Therapy

## 2021-04-10 DIAGNOSIS — R278 Other lack of coordination: Secondary | ICD-10-CM

## 2021-04-10 DIAGNOSIS — I639 Cerebral infarction, unspecified: Secondary | ICD-10-CM

## 2021-04-10 DIAGNOSIS — R208 Other disturbances of skin sensation: Secondary | ICD-10-CM

## 2021-04-10 DIAGNOSIS — R41844 Frontal lobe and executive function deficit: Secondary | ICD-10-CM

## 2021-04-10 DIAGNOSIS — R2681 Unsteadiness on feet: Secondary | ICD-10-CM

## 2021-04-10 DIAGNOSIS — R270 Ataxia, unspecified: Secondary | ICD-10-CM

## 2021-04-10 DIAGNOSIS — R41841 Cognitive communication deficit: Secondary | ICD-10-CM | POA: Diagnosis not present

## 2021-04-10 DIAGNOSIS — I69218 Other symptoms and signs involving cognitive functions following other nontraumatic intracranial hemorrhage: Secondary | ICD-10-CM

## 2021-04-10 DIAGNOSIS — M6281 Muscle weakness (generalized): Secondary | ICD-10-CM

## 2021-04-10 DIAGNOSIS — R471 Dysarthria and anarthria: Secondary | ICD-10-CM

## 2021-04-10 DIAGNOSIS — R41842 Visuospatial deficit: Secondary | ICD-10-CM

## 2021-04-10 NOTE — Therapy (Addendum)
[facility] [address] [place], [place], [address] Phone: [phone]   Fax:  [phone]  Occupational Therapy Treatment  Patient Details  Name: Walter [name] MRN: [id number] Date of Birth: 08/16/1955 Referring Provider (OT): Dr. Alysia Penna   Encounter Date: 04/10/2021   OT End of Session - 04/10/21 1231     Visit Number 28    Number of Visits 72    Date for OT Re-Evaluation 04/27/21    Authorization Type Medicare Part A & B **need to add KX**    Authorization - Visit Number 28    Authorization - Number of Visits 30    Progress Note Due on Visit 70    OT Start Time 1233    OT Stop Time 1315    OT Time Calculation (min) 42 min    Activity Tolerance Patient tolerated treatment well    Behavior During Therapy [facility] for tasks assessed/performed             Past Medical History:  Diagnosis Date   Atrial flutter ([place])    s/p ablation   DVT (deep venous thrombosis) ([place])    Near syncope 09/25/2013   Paroxysmal atrial fibrillation ([place])    Stroke ([place])    initially hemorragic (not on [place]) followed by subsequent embolic stroke    Past Surgical History:  Procedure Laterality Date   A-FLUTTER ABLATION N/A 03/09/2020   Procedure: A-FLUTTER ABLATION;  Surgeon: Thompson Grayer, MD;  Location: [place] CV LAB;  Service: Cardiovascular;  Laterality: N/A;   CARDIOVERSION N/A 09/27/2013   Procedure: CARDIOVERSION;  Surgeon: Lelon Perla, MD;  Location: [facility] ENDOSCOPY;  Service: Cardiovascular;  Laterality: N/A;   IR ANGIO EXTERNAL CAROTID SEL EXT CAROTID UNI R MOD SED  09/26/2020   IR ANGIO INTRA EXTRACRAN SEL COM CAROTID INNOMINATE BILAT MOD SED  09/26/2020   IR ANGIO VERTEBRAL SEL VERTEBRAL UNI R MOD SED  09/26/2020   IR IVC FILTER PLMT / S&I /IMG GUID/MOD SED  10/08/2020   IR IVUS EACH ADDITIONAL NON CORONARY VESSEL  11/02/2020   IR PTA VENOUS EXCEPT DIALYSIS CIRCUIT  11/02/2020   IR RADIOLOGIST EVAL & MGMT   01/17/2021   IR THROMBECT VENO MECH MOD SED  10/27/2020   IR THROMBECT VENO MECH MOD SED  11/02/2020   IR US GUIDE VASC ACCESS LEFT  10/27/2020   IR US GUIDE VASC ACCESS RIGHT  09/26/2020   IR US GUIDE VASC ACCESS RIGHT  11/02/2020   IR VENO/EXT/UNI RIGHT  11/02/2020   IR VENOCAVAGRAM IVC  10/27/2020   RETINAL DETACHMENT SURGERY     right knee arthroscopy     TEE WITHOUT CARDIOVERSION N/A 09/27/2013   Procedure: TRANSESOPHAGEAL ECHOCARDIOGRAM (TEE);  Surgeon: Lelon Perla, MD;  Location: [facility] ENDOSCOPY;  Service: Cardiovascular;  Laterality: N/A;   WRIST SURGERY      There were no vitals filed for this visit.   Subjective Assessment - 04/10/21 1231     Subjective  Pt reports no pain.  Pt reports difficulty writing    Patient is accompanied by: --   caregiver--Kelly   Pertinent History ICH of brainstem (complicated by hydrocephalus, DVTs, and tracheostomy).  PMH:  a-fib    Limitations fall risk, ataxia, impulsivity    Patient Stated Goals use L side of body better (decrease ataxia), stay active and heal brain    Currently in Pain? No/denies              Pt  reports that he wants to be able to stay home alone while wife runs errands for a few hours.  (PT reports practicing mobility for toileting today with pt).  Discussed considerations prior to attempting:  slowing down, going to restroom prior to wife leaving, having needed items nearby to limit need to stand/walk while wife is gone, having phone/emergency alert to contact help at all times.  Discussed concerns about impulsivity contributing to fall risk.  Pt verbalized understanding and reports that now he is toileting mod I during the day.  Pt also reports that he is able to go to restroom mod I in community depending on circumstances.  Discussed challenges in the community.  Wt. Bearing in siting through L hand on mat with body on arm movements with min cueing/facilitation with hand over dome.  Quadruped with forward/backward wt. Shifts  as able and min cueing/facilitation for midline alignment.  Tall kneeling, pushing ball forward/back with min cueing for slow, controlled movements for incr core stability, followed by closed-chain shoulder flex with BUEs with min cueing for speed.  Prone, on elbows with wt. Shifts, contralateral UE reaching with min cueing, then modified plank on elbows/knees with min-mod cueing for midline alignment and positioning.  Pt reports difficulty writing for speech homework and in margins for contracts.  Practiced writing.  Pt was able to write legibility, but tends to write larger and reports difficulty writing in margins.  Recommended using sticky notes that he can position prn (sticky notes with lines may also be beneficial).  Also recommended pt think about what he wants to write prior to starting to write (read/think with pen down, prior to picking up pen).             OT Short Term Goals - 04/05/21 1436       OT SHORT TERM GOAL #1   Title Pt/caregiver will be independent with initial HEP for LUE coordination and R hand strength.--    Time 4    Period Weeks    Status Achieved   needs continued reinforcement, pt is not using consistently   Target Date 03/02/21      OT SHORT TERM GOAL #2   Title Pt/caregiver will be independent with vision HEP and visual compensation strategies.    Time 4    Period Weeks    Status Achieved   Pt/ family verbalize understanding of vision HEP, needs continued reinforcement of compensatory strategeis- pt is not performing head turns     OT SHORT TERM GOAL #3   Title Pt will improve LUE coordination for ADLs as shown by improving score on box and blocks test by at least 6.    Baseline L-21 blocks    Time 4    Period Weeks    Status On-going   25 blocks.  03/12/21:  24 blocks     OT SHORT TERM GOAL #4   Title Pt will perform simple snack prep/home maintenance task from w/c level mod I.    Time 4    Period Weeks    Status Achieved   met per pt's wife      OT SHORT TERM GOAL #5   Title Pt will verbalize understanding of compensation strategies for sensory deficts for incr safety.    Time 4    Period Weeks    Status Achieved   Pt verbalized that he has to be careful about temperature, needs additional reinforcement.  03/12/21:  met     OT SHORT TERM GOAL #  6   Title Pt will improve R hand grip stength to at least 32lbs to assist with ADLs/opening containers.    Time 4    Period Weeks    Status Achieved   33.5     OT SHORT TERM GOAL #7   Title --               OT Long Term Goals - 04/10/21 1237       OT LONG TERM GOAL #1   Title Pt will be independent with updated HEP--    Time 12    Period Weeks    Status On-going      OT LONG TERM GOAL #2   Title Pt will improve L hand coordination for ADLs as shown by completing 9-hole peg test in less than 70sec.    Baseline 107sec    Time 12    Period Weeks    Status On-going   03/12/21:  78sec, 78sec (with multiple drops)     OT LONG TERM GOAL #3   Title Pt will be mod I with toileting.    Time 12    Period Weeks    Status Achieved   03/12/21:  supervision/CGA.  04/10/21:  met per pt report     OT LONG TERM GOAL #4   Title Pt will perform simple snack prep/home maintenance tasks in standing with supervision.    Time 12    Period Weeks    Status On-going      OT LONG TERM GOAL #5   Title Pt will demo good safety awareness/compensation for sensory deficits and attention to LUE during functional tasks and transfers without cueing.    Time 12    Period Weeks    Status On-going      OT LONG TERM GOAL #6   Title Pt will perform simple environmental scanning/navigation with at least 90% accuracy for incr safety.    Time 12    Period Weeks    Status On-going   03/12/21:  70% with CGA for balance/ambulation     OT LONG TERM GOAL #7   Title Pt will perform all basic ADLS with no more than supervision.    Time 12    Period Weeks    Status On-going   03/12/21:  min  A-supervision                  Plan - 04/10/21 1231     Clinical Impression Statement Pt reports that he typically goes to the restroom at home alone now except at night.  Pt continues to progress with improved functional mobility and UE coordination/control.    OT Occupational Profile and History Detailed Assessment- Review of Records and additional review of physical, cognitive, psychosocial history related to current functional performance    Occupational performance deficits (Please refer to evaluation for details): ADL's;IADL's;Work;Leisure;Social Participation    Body Structure / Function / Physical Skills ADL;Strength;Balance;Proprioception;UE functional use;IADL;Endurance;Vision;Mobility;Coordination;Decreased knowledge of precautions;FMC;GMC;Sensation    Cognitive Skills Attention;Safety Awareness;Memory;Perception;Problem Solve    Rehab Potential Good    Clinical Decision Making Several treatment options, min-mod task modification necessary    Comorbidities Affecting Occupational Performance: May have comorbidities impacting occupational performance    Modification or Assistance to Complete Evaluation  Min-Moderate modification of tasks or assist with assess necessary to complete eval    OT Frequency 3x / week    OT Duration 12 weeks    OT Treatment/Interventions Self-care/ADL training;Moist Heat;Fluidtherapy;DME and/or AE instruction;Balance training;Therapeutic activities;Aquatic  Therapy;Ultrasound;Therapeutic exercise;Cognitive remediation/compensation;Visual/perceptual remediation/compensation;Functional Mobility Training;Neuromuscular education;Cryotherapy;Energy conservation;Manual Therapy;Patient/family education    Plan transfers/safety, environmental scanning, simple home maintenance (WBAT RLE with boot)    Consulted and Agree with Plan of Care Family member/caregiver;Patient    Family Member Consulted --             Patient will benefit from skilled  therapeutic intervention in order to improve the following deficits and impairments:   Body Structure / Function / Physical Skills: ADL, Strength, Balance, Proprioception, UE functional use, IADL, Endurance, Vision, Mobility, Coordination, Decreased knowledge of precautions, FMC, GMC, Sensation Cognitive Skills: Attention, Safety Awareness, Memory, Perception, Problem Solve     Visit Diagnosis: Ataxia  Other lack of coordination  Other disturbances of skin sensation  Muscle weakness (generalized)  Unsteadiness on feet  Frontal lobe and executive function deficit  Other symptoms and signs involving cognitive functions following other nontraumatic intracranial hemorrhage  Visuospatial deficit    Problem List Patient Active Problem List   Diagnosis Date Noted   Thrombosis    Sleep disturbance    Dysphagia, post-stroke    PAF (paroxysmal atrial fibrillation) (H[facility])    Acute pulmonary embolism without acute cor pulmonale (HCC)    Malnutrition of moderate degree 11/04/2020   Tracheostomy care ([place])    Pressure injury of skin 10/23/2020   Intracranial hemorrhage ([facility])    Atrial fibrillation (H[facility])    Prediabetes    Leukocytosis    Acute blood loss anemia    Hypernatremia    ICH (intracerebral hemorrhage) ([place]) 09/25/2020   Unilateral primary osteoarthritis, left knee 09/26/2016   Atrial flutter ([place]) 09/25/2013   Near syncope 09/25/2013    [facility], OT/L 04/10/2021, 3:02 PM  [facility] [address] [place], [place], [address] Phone: [phone]   Fax:  [phone]  Name: ISAACK PREBLE MRN: [id number] Date of Birth: 12/05/1955  Vianne Bulls, OTR/L [facility] [address] [place], [place]  [address] [phone] phone [phone] 04/10/21 3:02 PM

## 2021-04-10 NOTE — Therapy (Signed)
[facility] [address] [place], [place], [address] Phone: [phone]   Fax:  [phone]  Physical Therapy Treatment  Patient Details  Name: Walter [name] MRN: [id number] Date of Birth: [birth year] No data recorded  Encounter Date: 04/10/2021   PT End of Session - 04/10/21 1201     Visit Number 32    Number of Visits 44    Date for PT Re-Evaluation 05/04/21    Authorization Type Medicare 12/26/20 to 02/20/21; Recert 02/21/21 to 04/26/21; 10v PN/recert on 03/09/21 to 05/04/21    Authorization Time Period 03/09/21-05/04/21    Progress Note Due on Visit 30    PT Start Time 1155    PT Stop Time 1235    PT Time Calculation (min) 40 min    Equipment Utilized During Treatment Gait belt    Activity Tolerance Patient tolerated treatment well    Behavior During Therapy Impulsive             Past Medical History:  Diagnosis Date   Atrial flutter ([facility])    s/p ablation   DVT (deep venous thrombosis) ([facility])    Near syncope 09/25/2013   Paroxysmal atrial fibrillation ([facility])    Stroke [facility])    initially hemorragic (not on OAC) followed by subsequent embolic stroke    Past Surgical History:  Procedure Laterality Date   A-FLUTTER ABLATION N/A 03/09/2020   Procedure: A-FLUTTER ABLATION;  Surgeon: Hillis Range, MD;  Location: [facility] INVASIVE CV LAB;  Service: Cardiovascular;  Laterality: N/A;   CARDIOVERSION N/A 09/27/2013   Procedure: CARDIOVERSION;  Surgeon: Lewayne Bunting, MD;  Location: [facility] ENDOSCOPY;  Service: Cardiovascular;  Laterality: N/A;   IR ANGIO EXTERNAL CAROTID SEL EXT CAROTID UNI R MOD SED  09/26/2020   IR ANGIO INTRA EXTRACRAN SEL COM CAROTID INNOMINATE BILAT MOD SED  09/26/2020   IR ANGIO VERTEBRAL SEL VERTEBRAL UNI R MOD SED  09/26/2020   IR IVC FILTER PLMT / S&I /IMG GUID/MOD SED  10/08/2020   IR IVUS EACH ADDITIONAL NON CORONARY VESSEL  11/02/2020   IR PTA VENOUS EXCEPT DIALYSIS CIRCUIT  11/02/2020   IR RADIOLOGIST  EVAL & MGMT  01/17/2021   IR THROMBECT VENO MECH MOD SED  10/27/2020   IR THROMBECT VENO MECH MOD SED  11/02/2020   IR US GUIDE VASC ACCESS LEFT  10/27/2020   IR US GUIDE VASC ACCESS RIGHT  09/26/2020   IR US GUIDE VASC ACCESS RIGHT  11/02/2020   IR VENO/EXT/UNI RIGHT  11/02/2020   IR VENOCAVAGRAM IVC  10/27/2020   RETINAL DETACHMENT SURGERY     right knee arthroscopy     TEE WITHOUT CARDIOVERSION N/A 09/27/2013   Procedure: TRANSESOPHAGEAL ECHOCARDIOGRAM (TEE);  Surgeon: Lewayne Bunting, MD;  Location: [facility] ENDOSCOPY;  Service: Cardiovascular;  Laterality: N/A;   WRIST SURGERY      There were no vitals filed for this visit.   Subjective Assessment - 04/11/21 1057     Subjective My wife wants to know if I can be left alone at home while she is running errands.    Currently in Pain? No/denies              PRacticed bathroom transfer: ambulated 100 feet with RW to bathroom with SBA for trasnfers and walking. Pt needed verbal cues to make sure he has walker in front of him when he is getting off the toilet so he doesn't have to reach for it on his side. Biodex harness suppoted  treadmill walking: harness for safety only, 100% Body weight - 1. for 3' with bil HHA, cues for controlled heel strike, "make your steps quieter" Rest - 1. for 3' with 5% incline and then 1. for 3' with 0% incline: total 6', bil HHA                                      Plan - 04/10/21 1210     Clinical Impression Statement Today's skilled session was focused on gait training on treadmill to improve reciprocal gait pattern, increase cadence ad improve overall gait quality.    Personal Factors and Comorbidities Age;Comorbidity 2    Examination-Activity Limitations Bathing;Dressing;Hygiene/Grooming;Bed Mobility;Lift;Locomotion Level;Stairs;Squat;Stand;Transfers;Carry    Examination-Participation Restrictions Church;Community Activity;Driving;Medication Management;Laundry;Shop     PT Treatment/Interventions Aquatic Therapy;ADLs/Self Care Home Management;Gait training;Stair training;Functional mobility training;Therapeutic activities;Therapeutic exercise;Balance training;Neuromuscular re-education;Wheelchair mobility training;Patient/family education;Energy conservation    PT Next Visit Plan Aquatics: core strengthening    PT Home Exercise Plan sit<>stand             Patient will benefit from skilled therapeutic intervention in order to improve the following deficits and impairments:  Abnormal gait, Decreased balance, Decreased endurance, Decreased mobility, Difficulty walking, Decreased activity tolerance, Decreased coordination, Decreased strength, Impaired UE functional use, Impaired vision/preception, Impaired tone, Decreased knowledge of precautions  Visit Diagnosis: Ataxia  Unsteadiness on feet  Muscle weakness (generalized)  Cerebrovascular accident (CVA), unspecified mechanism (HCC)     Problem List Patient Active Problem List   Diagnosis Date Noted   Thrombosis    Sleep disturbance    Dysphagia, post-stroke    PAF (paroxysmal atrial fibrillation) (HCC)    Acute pulmonary embolism without acute cor pulmonale (HCC)    Malnutrition of moderate degree 11/04/2020   Tracheostomy care (HCC)    Pressure injury of skin 10/23/2020   Intracranial hemorrhage (HCC)    Atrial fibrillation (HCC)    Prediabetes    Leukocytosis    Acute blood loss anemia    Hypernatremia    ICH (intracerebral hemorrhage) (HCC) 09/25/2020   Unilateral primary osteoarthritis, left knee 09/26/2016   Atrial flutter (HCC) 09/25/2013   Near syncope 09/25/2013    [name], PT 04/11/2021, 11:02 AM  [facility] [address] [place], [place], [address] Phone: [phone]   Fax:  [phone]  Name: Walter [name] MRN: [id number] Date of Birth: 1955-11-02

## 2021-04-10 NOTE — Therapy (Signed)
[facility] [address] [place], [place], [address] Phone: [phone]   Fax:  [phone]  Speech Language Pathology Treatment  Patient Details  Name: Walter [name] MRN: [id number] Date of Birth: [birth year] Referring Provider (SLP): Charlett Blake, MD   Encounter Date: 04/10/2021   End of Session - 04/10/21 1259     Visit Number 29    Number of Visits 83    Date for SLP Re-Evaluation 04/20/21    Authorization Type Medicare    SLP Start Time 1318    SLP Stop Time  1400    SLP Time Calculation (min) 42 min    Activity Tolerance Patient tolerated treatment well             Past Medical History:  Diagnosis Date   Atrial flutter ([place])    s/p ablation   DVT (deep venous thrombosis) ([place])    Near syncope 09/25/2013   Paroxysmal atrial fibrillation ([place])    Stroke [facility])    initially hemorragic (not on [facility]) followed by subsequent embolic stroke    Past Surgical History:  Procedure Laterality Date   A-FLUTTER ABLATION N/A 03/09/2020   Procedure: A-FLUTTER ABLATION;  Surgeon: Thompson Grayer, MD;  Location: [facility] CV LAB;  Service: Cardiovascular;  Laterality: N/A;   CARDIOVERSION N/A 09/27/2013   Procedure: CARDIOVERSION;  Surgeon: Lelon Perla, MD;  Location: [facility] ENDOSCOPY;  Service: Cardiovascular;  Laterality: N/A;   IR ANGIO EXTERNAL CAROTID SEL EXT CAROTID UNI R MOD SED  09/26/2020   IR ANGIO INTRA EXTRACRAN SEL COM CAROTID INNOMINATE BILAT MOD SED  09/26/2020   IR ANGIO VERTEBRAL SEL VERTEBRAL UNI R MOD SED  09/26/2020   IR IVC FILTER PLMT / S&I /IMG GUID/MOD SED  10/08/2020   IR IVUS EACH ADDITIONAL NON CORONARY VESSEL  11/02/2020   IR PTA VENOUS EXCEPT DIALYSIS CIRCUIT  11/02/2020   IR RADIOLOGIST EVAL & MGMT  01/17/2021   IR THROMBECT VENO MECH MOD SED  10/27/2020   IR THROMBECT VENO MECH MOD SED  11/02/2020   IR US GUIDE VASC ACCESS LEFT  10/27/2020   IR US GUIDE VASC ACCESS RIGHT  09/26/2020   IR  US GUIDE VASC ACCESS RIGHT  11/02/2020   IR VENO/EXT/UNI RIGHT  11/02/2020   IR VENOCAVAGRAM IVC  10/27/2020   RETINAL DETACHMENT SURGERY     right knee arthroscopy     TEE WITHOUT CARDIOVERSION N/A 09/27/2013   Procedure: TRANSESOPHAGEAL ECHOCARDIOGRAM (TEE);  Surgeon: Lelon Perla, MD;  Location: [facility] ENDOSCOPY;  Service: Cardiovascular;  Laterality: N/A;   WRIST SURGERY      There were no vitals filed for this visit.   Subjective Assessment - 04/10/21 1411     Subjective "I forgot my homework but I can tell you about it"    Patient is accompained by: Family member   caregiver   Currently in Pain? No/denies                   ADULT SLP TREATMENT - 04/10/21 1259       General Information   Behavior/Cognition Alert;Cooperative;Pleasant mood;Requires cueing;Impulsive      Treatment Provided   Treatment provided Cognitive-Linquistic      Cognitive-Linquistic Treatment   Treatment focused on Cognition;Dysarthria    Skilled Treatment Pt forgot HEP at home; however, pt able to effectively recall details with rare prompting. Occasional cues requird to ID additional supplies needed. Some difficulty managing schedule reported, as pt stated "  the times are sometimes different than what I remember." SLP suggested printing out additional calendar for patient to double check with at home. Of note, distracting ataxic behaviors exhibited during conversation. SLP provided cues to aid attention, which did  reduce behaviors. SLP suggested more subtle motor movement to reduce behaviors which may distract him or listener. Occasional cues required to attend in conversation as pt became distracted by phone.      Assessment / Recommendations / Plan   Plan Continue with current plan of care      Progression Toward Goals   Progression toward goals Progressing toward goals              SLP Education - 04/10/21 1418     Education Details attention, scheduling    Person(s) Educated  Patient;Caregiver(s)    Methods Explanation;Demonstration;Handout    Comprehension Verbalized understanding;Returned demonstration;Need further instruction;Verbal cues required              SLP Short Term Goals - 02/21/21 1552       SLP SHORT TERM GOAL #1   Title Pt will complete dysarthria HEP with occasional min A over 2 sessions    Status Not Met      SLP SHORT TERM GOAL #2   Title Pt will produce loud /a/ or "hey!" with at least high 80s dB average over 2 sessions    Status Not Met      SLP SHORT TERM GOAL #3   Title Pt will generate abdominal breathing in 16/20 answers to SLP questions over 2 sessions    Baseline 01-18-21    Status Partially Met      SLP SHORT TERM GOAL #4   Title Pt will demonstrate dysarthria compensations in 10 minute simple to mod complex conversation to be 75% intelligibile with occasional min A over 2 sessions    Status Not Met      SLP SHORT TERM GOAL #5   Title Pt will complete formal cognitive linguistic assessment in first 1-2 ST sessions    Status Achieved      SLP SHORT TERM GOAL #6   Title Pt will use memory and attention compensations for appointments, medicine management, and other daily activities with occasional min A over 2 sessions    Status Not Met              SLP Long Term Goals - 04/10/21 1300       SLP LONG TERM GOAL #1   Title Pt will complete dysarthria HEP with rare min A over 2 sessions    Baseline 03-20-21, 03-22-21    Status Achieved      SLP LONG TERM GOAL #2   Title Pt will demonstrate dysarthria compensations in 10 minute simple conversation to be 90% intelligibile with rare min A over 3 sessions    Baseline 03-22-21; 03-29-21, 04-03-21    Status Achieved      SLP LONG TERM GOAL #3   Title Pt will demo improved sustained and selective attention during therapeutic tasks with less than 3 cues for active listening during 2 sessions    Baseline 04-05-21    Status Achieved      SLP LONG TERM GOAL #4   Title Pt  will demonstrate anticipatory awareness of impulsivity with min cues to modify behaviors during 2 sessions    Time 2    Period Weeks    Status On-going   and ongoing   Target Date 04/20/21  SLP LONG TERM GOAL #5   Title Pt will report reduced frustration and improved communication effectiveness via PROM by 2 points at last ST sessions    Baseline CES: 15.5 & V-RQOL:35    Time 8    Period Weeks    Status On-going    Target Date 04/20/21      SLP LONG TERM GOAL #6   Title Pt will selectively attend to cognitive linguistic task for 5 minutes with 3 or less cues for redirection over 3 sessions    Baseline 04-03-21    Time 8    Period Weeks    Status On-going    Target Date 04/20/21              Plan - 04/10/21 1259     Clinical Impression Statement "[name]" presents for OPST intervention secondary to stroke in March 2022.  Pt agrees to shift focus from speech to cognition. SLP targeted recall and attention related to functional tasks, which appeared to be intermittentely impacted by impulsivity. See daily note for details. Skilled ST is cont'd warranted to address cognition to maximize communication effectiveness and optimize functional independence and safety at home.    Speech Therapy Frequency 2x / week    Duration --   16 weeks   Treatment/Interventions Cueing hierarchy;Functional tasks;Patient/family education;Cognitive reorganization;Multimodal communcation approach;Language facilitation;Compensatory techniques;Internal/external aids;SLP instruction and feedback    Potential to Achieve Goals Fair    Potential Considerations Ability to learn/carryover information;Cooperation/participation level    SLP Home Exercise Plan provided    Consulted and Agree with Plan of Care Patient             Patient will benefit from skilled therapeutic intervention in order to improve the following deficits and impairments:   Cognitive communication deficit  Dysarthria and  anarthria    Problem List Patient Active Problem List   Diagnosis Date Noted   Thrombosis    Sleep disturbance    Dysphagia, post-stroke    PAF (paroxysmal atrial fibrillation) ([place])    Acute pulmonary embolism without acute cor pulmonale (HCC)    Malnutrition of moderate degree 11/04/2020   Tracheostomy care ([place])    Pressure injury of skin 10/23/2020   Intracranial hemorrhage (H[facility])    Atrial fibrillation ([place])    Prediabetes    Leukocytosis    Acute blood loss anemia    Hypernatremia    ICH (intracerebral hemorrhage) ([place]) 09/25/2020   Unilateral primary osteoarthritis, left knee 09/26/2016   Atrial flutter ([place]) 09/25/2013   Near syncope 09/25/2013    Alinda Deem, [name] 04/10/2021, 2:20 PM  [facility] [address] [place], [place], [address] Phone: [phone]   Fax:  [phone]   Name: KITAI PURDOM MRN: [id number] Date of Birth: 10-17-1955

## 2021-04-11 ENCOUNTER — Ambulatory Visit: Payer: Medicare Other | Admitting: Occupational Therapy

## 2021-04-11 ENCOUNTER — Encounter: Payer: Self-pay | Admitting: Occupational Therapy

## 2021-04-11 ENCOUNTER — Ambulatory Visit: Payer: Medicare Other

## 2021-04-11 DIAGNOSIS — R208 Other disturbances of skin sensation: Secondary | ICD-10-CM

## 2021-04-11 DIAGNOSIS — M6281 Muscle weakness (generalized): Secondary | ICD-10-CM

## 2021-04-11 DIAGNOSIS — R41841 Cognitive communication deficit: Secondary | ICD-10-CM

## 2021-04-11 DIAGNOSIS — R41844 Frontal lobe and executive function deficit: Secondary | ICD-10-CM

## 2021-04-11 DIAGNOSIS — R2681 Unsteadiness on feet: Secondary | ICD-10-CM

## 2021-04-11 DIAGNOSIS — R278 Other lack of coordination: Secondary | ICD-10-CM

## 2021-04-11 DIAGNOSIS — R270 Ataxia, unspecified: Secondary | ICD-10-CM

## 2021-04-11 DIAGNOSIS — R41842 Visuospatial deficit: Secondary | ICD-10-CM

## 2021-04-11 NOTE — Therapy (Signed)
[facility] [address] [place], [place], [address] Phone: [phone]   Fax:  [phone]  Speech Language Pathology Treatment/Progress Note  Patient Details  Name: Walter [name] MRN: [id number] Date of Birth: 11-03-1955 Referring Provider (SLP): Charlett Blake, MD   Encounter Date: 04/11/2021   End of Session - 04/11/21 1138     Visit Number 30    Number of Visits 33    Date for SLP Re-Evaluation 04/20/21    Authorization Type Medicare    SLP Start Time 1145    SLP Stop Time  1232    SLP Time Calculation (min) 47 min    Activity Tolerance Patient tolerated treatment well             Past Medical History:  Diagnosis Date   Atrial flutter ([place])    s/p ablation   DVT (deep venous thrombosis) ([place])    Near syncope 09/25/2013   Paroxysmal atrial fibrillation ([place])    Stroke [facility])    initially hemorragic (not on [facility]) followed by subsequent embolic stroke    Past Surgical History:  Procedure Laterality Date   A-FLUTTER ABLATION N/A 03/09/2020   Procedure: A-FLUTTER ABLATION;  Surgeon: Thompson Grayer, MD;  Location: [place] CV LAB;  Service: Cardiovascular;  Laterality: N/A;   CARDIOVERSION N/A 09/27/2013   Procedure: CARDIOVERSION;  Surgeon: Lelon Perla, MD;  Location: [facility] ENDOSCOPY;  Service: Cardiovascular;  Laterality: N/A;   IR ANGIO EXTERNAL CAROTID SEL EXT CAROTID UNI R MOD SED  09/26/2020   IR ANGIO INTRA EXTRACRAN SEL COM CAROTID INNOMINATE BILAT MOD SED  09/26/2020   IR ANGIO VERTEBRAL SEL VERTEBRAL UNI R MOD SED  09/26/2020   IR IVC FILTER PLMT / S&I /IMG GUID/MOD SED  10/08/2020   IR IVUS EACH ADDITIONAL NON CORONARY VESSEL  11/02/2020   IR PTA VENOUS EXCEPT DIALYSIS CIRCUIT  11/02/2020   IR RADIOLOGIST EVAL & MGMT  01/17/2021   IR THROMBECT VENO MECH MOD SED  10/27/2020   IR THROMBECT VENO MECH MOD SED  11/02/2020   IR US GUIDE VASC ACCESS LEFT  10/27/2020   IR US GUIDE VASC ACCESS RIGHT   09/26/2020   IR US GUIDE VASC ACCESS RIGHT  11/02/2020   IR VENO/EXT/UNI RIGHT  11/02/2020   IR VENOCAVAGRAM IVC  10/27/2020   RETINAL DETACHMENT SURGERY     right knee arthroscopy     TEE WITHOUT CARDIOVERSION N/A 09/27/2013   Procedure: TRANSESOPHAGEAL ECHOCARDIOGRAM (TEE);  Surgeon: Lelon Perla, MD;  Location: [facility] ENDOSCOPY;  Service: Cardiovascular;  Laterality: N/A;   WRIST SURGERY      There were no vitals filed for this visit.   Subjective Assessment - 04/11/21 1304     Subjective "I'm doing good"    Patient is accompained by: Family member   caregiver   Currently in Pain? No/denies             Speech Therapy Progress Note  Dates of Reporting Period:  12-26-20 to present  Objective Reports of Subjective Statement: Pt has been seen for 30 ST visits targeting cognition and dysarthria.   Objective Measurements: As pt has exhibited significant improvements in dysarthria with ability to carryover and maintain adequate loudness and clarity in conversation, ST sessions have shifted focus on cognition to address executive functioning, attention, and impulsivity. Pt demonstrates increasing awareness of deficits and ability to reduce attention and impulsivity with intermittent cues.   Goal Update: see goals below  Plan: continue per POC  Reason Skilled Services are Required: continue per POC as pt has not yet met rehab potential at this time      ADULT SLP TREATMENT - 04/11/21 1138       General Information   Behavior/Cognition Alert;Cooperative;Pleasant mood;Requires cueing;Impulsive      Treatment Provided   Treatment provided Cognitive-Linquistic      Cognitive-Linquistic Treatment   Treatment focused on Cognition;Dysarthria    Skilled Treatment Pt exhibited impulsivity while sitting down in chair. SLP cued patient to re-attempt and process each necessary step to complete task safely. Improved accuracy exhibited. Pt recalled entire medical hx and course of ST  intervention to tell student observer with no cues required. Pt and caregiver has been using calendar to aid thought organization and recall, with good success. SLP engaged patient in attention to detail and executive function tasks for budgeting grocery list with specific targeted items. SLP cued double check, in which pt ID'd missed component x1. With mod prompting, pt able to ID errors and correct wrong items selected. Pt exhibited awareness of internal distractions x2, although pt carried out tangential action versus staying on task. Comments were relevant, but did remove attention from task.      Assessment / Recommendations / Plan   Plan Continue with current plan of care      Progression Toward Goals   Progression toward goals Progressing toward goals              SLP Education - 04/11/21 1322     Education Details targeting executive functioning and attention    Person(s) Educated Patient;Caregiver(s)    Methods Explanation;Demonstration;Handout;Verbal cues    Comprehension Verbalized understanding;Returned demonstration;Verbal cues required;Need further instruction              SLP Short Term Goals - 02/21/21 1552       SLP SHORT TERM GOAL #1   Title Pt will complete dysarthria HEP with occasional min A over 2 sessions    Status Not Met      SLP SHORT TERM GOAL #2   Title Pt will produce loud /a/ or "hey!" with at least high 80s dB average over 2 sessions    Status Not Met      SLP SHORT TERM GOAL #3   Title Pt will generate abdominal breathing in 16/20 answers to SLP questions over 2 sessions    Baseline 01-18-21    Status Partially Met      SLP SHORT TERM GOAL #4   Title Pt will demonstrate dysarthria compensations in 10 minute simple to mod complex conversation to be 75% intelligibile with occasional min A over 2 sessions    Status Not Met      SLP SHORT TERM GOAL #5   Title Pt will complete formal cognitive linguistic assessment in first 1-2 ST sessions     Status Achieved      SLP SHORT TERM GOAL #6   Title Pt will use memory and attention compensations for appointments, medicine management, and other daily activities with occasional min A over 2 sessions    Status Not Met              SLP Long Term Goals - 04/11/21 1139       SLP LONG TERM GOAL #1   Title Pt will complete dysarthria HEP with rare min A over 2 sessions    Baseline 03-20-21, 03-22-21    Status Achieved      SLP LONG  TERM GOAL #2   Title Pt will demonstrate dysarthria compensations in 10 minute simple conversation to be 90% intelligibile with rare min A over 3 sessions    Baseline 03-22-21; 03-29-21, 04-03-21    Status Achieved      SLP LONG TERM GOAL #3   Title Pt will demo improved sustained and selective attention during therapeutic tasks with less than 3 cues for active listening during 2 sessions    Baseline 04-05-21    Status Achieved    Target Date 04/20/21      SLP LONG TERM GOAL #4   Title Pt will demonstrate anticipatory awareness of impulsivity with min cues to modify behaviors during 2 sessions    Baseline 04-11-21    Time 2    Period Weeks    Status On-going   and ongoing   Target Date 04/20/21      SLP LONG TERM GOAL #5   Title Pt will report reduced frustration and improved communication effectiveness via PROM by 2 points at last ST sessions    Baseline CES: 15.5 & V-RQOL:35    Time 8    Period Weeks    Status On-going    Target Date 04/20/21      SLP LONG TERM GOAL #6   Title Pt will selectively attend to cognitive linguistic task for 5 minutes with 3 or less cues for redirection over 3 sessions    Baseline 04-03-21, 04-11-21    Time --    Period --    Status Achieved    Target Date 04/20/21              Plan - 04/11/21 1139     Clinical Impression Statement "Walter [name]" presents for OPST intervention secondary to stroke in March 2022.  Pt agrees to shift focus from speech to cognition. SLP targeted recall and attention related to  functional tasks, in which occasional cues required to increase attention to detail and error awareness. Good recall and reasoning exhibited this session. See daily note for details. Skilled ST is cont'd warranted to address cognition to maximize communication effectiveness and optimize functional independence and safety at home.    Speech Therapy Frequency 2x / week    Duration Other (comment)   16 weeks   Treatment/Interventions Cueing hierarchy;Functional tasks;Patient/family education;Cognitive reorganization;Multimodal communcation approach;Language facilitation;Compensatory techniques;Internal/external aids;SLP instruction and feedback    Potential to Achieve Goals Fair    Potential Considerations Ability to learn/carryover information;Cooperation/participation level    SLP Home Exercise Plan provided    Consulted and Agree with Plan of Care Patient             Patient will benefit from skilled therapeutic intervention in order to improve the following deficits and impairments:   Cognitive communication deficit    Problem List Patient Active Problem List   Diagnosis Date Noted   Thrombosis    Sleep disturbance    Dysphagia, post-stroke    PAF (paroxysmal atrial fibrillation) ([facility])    Acute pulmonary embolism without acute cor pulmonale (HCC)    Malnutrition of moderate degree 11/04/2020   Tracheostomy care ([place])    Pressure injury of skin 10/23/2020   Intracranial hemorrhage ([facility])    Atrial fibrillation ([place])    Prediabetes    Leukocytosis    Acute blood loss anemia    Hypernatremia    ICH (intracerebral hemorrhage) ([place]) 09/25/2020   Unilateral primary osteoarthritis, left knee 09/26/2016   Atrial flutter ([place]) 09/25/2013   Near syncope 09/25/2013  Alinda Deem, [name] 04/11/2021, 1:25 PM  [facility] [address] [place], [place], [address] Phone: [phone]   Fax:   [phone]   Name: Walter [name] MRN: [id number] Date of Birth: 04/06/1956

## 2021-04-11 NOTE — Therapy (Signed)
OUTPATIENT PHYSICAL THERAPY TREATMENT NOTE/PROGRESS NOTE Patient Name: Walter [name] MRN: [id number] DOB:1956/02/28, 65 y.o., male Today's Date: 04/11/2021  PCP: Alroy Dust, L.Marlou Sa, MD REFERRING PROVIDER: Alroy Dust, Carlean Jews.Marlou Sa, MD   See Note below for Objective Data and Assessment of Progress/Goals.     PT End of Session - 04/11/21 1017     Visit Number 33    Number of Visits 44    Date for PT Re-Evaluation 05/04/21    Authorization Type Medicare 12/26/20 to 02/20/21; Recert 10/10/85 to 86/76/72; 09O PN/recert on 7/0/96 to 28/3/66    Authorization Time Period 03/09/21-05/04/21    Progress Note Due on Visit 13    PT Start Time 1015    PT Stop Time 1100    PT Time Calculation (min) 45 min    Equipment Utilized During Treatment Gait belt    Activity Tolerance Patient tolerated treatment well    Behavior During Therapy WFL for tasks assessed/performed                   Past Medical History:  Diagnosis Date   Atrial flutter ([place])    s/p ablation   DVT (deep venous thrombosis) (HCC)    Near syncope 09/25/2013   Paroxysmal atrial fibrillation ([place])    Stroke [facility])    initially hemorragic (not on [place]) followed by subsequent embolic stroke   Past Surgical History:  Procedure Laterality Date   A-FLUTTER ABLATION N/A 03/09/2020   Procedure: A-FLUTTER ABLATION;  Surgeon: Thompson Grayer, MD;  Location: [facility];  Service: Cardiovascular;  Laterality: N/A;   CARDIOVERSION N/A 09/27/2013   Procedure: CARDIOVERSION;  Surgeon: Lelon Perla, MD;  Location: [facility] ENDOSCOPY;  Service: Cardiovascular;  Laterality: N/A;   IR ANGIO EXTERNAL CAROTID SEL EXT CAROTID UNI R MOD SED  09/26/2020   IR ANGIO INTRA EXTRACRAN SEL COM CAROTID INNOMINATE BILAT MOD SED  09/26/2020   IR ANGIO VERTEBRAL SEL VERTEBRAL UNI R MOD SED  09/26/2020   IR IVC FILTER PLMT / S&I /IMG GUID/MOD SED  10/08/2020   IR IVUS EACH ADDITIONAL NON CORONARY VESSEL  11/02/2020   IR PTA VENOUS EXCEPT DIALYSIS CIRCUIT   11/02/2020   IR RADIOLOGIST EVAL & MGMT  01/17/2021   IR THROMBECT VENO MECH MOD SED  10/27/2020   IR THROMBECT VENO MECH MOD SED  11/02/2020   IR US GUIDE VASC ACCESS LEFT  10/27/2020   IR US GUIDE VASC ACCESS RIGHT  09/26/2020   IR US GUIDE VASC ACCESS RIGHT  11/02/2020   IR VENO/EXT/UNI RIGHT  11/02/2020   IR VENOCAVAGRAM IVC  10/27/2020   RETINAL DETACHMENT SURGERY     right knee arthroscopy     TEE WITHOUT CARDIOVERSION N/A 09/27/2013   Procedure: TRANSESOPHAGEAL ECHOCARDIOGRAM (TEE);  Surgeon: Lelon Perla, MD;  Location: [facility] ENDOSCOPY;  Service: Cardiovascular;  Laterality: N/A;   WRIST SURGERY     Patient Active Problem List   Diagnosis Date Noted   Thrombosis    Sleep disturbance    Dysphagia, post-stroke    PAF (paroxysmal atrial fibrillation) ([place])    Acute pulmonary embolism without acute cor pulmonale (HCC)    Malnutrition of moderate degree 11/04/2020   Tracheostomy care ([place])    Pressure injury of skin 10/23/2020   Intracranial hemorrhage (HCC)    Atrial fibrillation (HCC)    Prediabetes    Leukocytosis    Acute blood loss anemia    Hypernatremia    ICH (intracerebral hemorrhage) ([place]) 09/25/2020   Unilateral  primary osteoarthritis, left knee 09/26/2016   Atrial flutter ([place]) 09/25/2013   Near syncope 09/25/2013    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 Sharol Given, socially, and was informed he had a partial R achilles tendon tear.  Placed in a walking boot for 6 weeks, patient reports no restrictions given to him.  TODAY'S TREATMENT:  04/11/21  Seated on blue disk, forward reaching to floor wih 4KG ball to lap and repeated w/o ball to vary resistance  levels and have patient adapt  Searwd on blue disk, lifting 4KG ball OH and repeating w/o ball to challenge core and adapt to varied resistance challenges   Seated LAQs on disk, 6# wts on L ankle, boot on R  with lat press alternating pattern, 1x15  Seated marching on disk, 6# wts on L ankle, boot on R, with lat press alternating pattern 1x15  15x STS performing OH press against red band once standing, CGA needed for stability  161f ambulation with 3# weighted cane, 6# on L ankle, requiring Min/ModA to prevent fall  1140fambulaton with 6# weighted L ankle and RW with Min/ModA needed to stabilize   04/10/21  PRacticed bathroom transfer: ambulated 100 feet with RW to bathroom with SBA for trasnfers and walking. Pt needed verbal cues to make sure he has walker in front of him when he is getting off the toilet so he doesn't have to reach for it on his side. Biodex harness suppoted treadmill walking: harness for safety only, 100% Body weight - 1.68m41mfor 3' with bil HHA, cues for controlled heel strike, "make your steps quieter" Rest - 1.4mp63mor 3' with 5% incline and then 1.8mph20mr 3' with 0% incline: total 6', bil HHA     04/03/21  Seated on green disk, forward reaching to floor wih 2.5KG ball followed by OH [facility] e[facility]. For 20 reps, single movement Seated trunk flexion/extension over sitting disk, extension blocked by bolster, 15x with 2.5KG ball  Seated B rotation holding 2.5KG ball 20x, hip to hip  Seated LAQs on disk, 4# wts, with lat press alternating pattern, 1x20  Seated marching on disk, 4# wts with lat press alternating pattern 1x20  Supine SLR circled CW/CCW against red band 2 min duration ea. LE  5x STS in 23.5s   03/29/21  Seated on green disk, forward reaching to floor wih 2KG ball followed by OH re[facility]. For 20 reps, single movement Seated trunk flexion/extension over sitting disk, extension blocked by bolster, 15x.  Seated B rotation holding 2KG ball 20x, hip to  hip  Seated LAQs on disk, 3# wts, with lat press alternating pattern, 2x15  Seated marching on disk, 3# wts with lat press alternating pattern  Supine SLR circled CW/CCW against red band 1 min duration ea. LE  Scifit L4 8'       03/27/21  Seated on green disk, forward reaching to floor wih 3KG ball followed by OH [facility] e[facility]. For 15 reps Seated trunk flexion/extension over sitting disk, extension blocked by bolster, 15x.  Seated B rotation holding 3KG ball 15x, hip to hip  Ambulation with QC 30ft,66f 30ft t62fSPC with 3# wt 115ft, r71fring Min/ModA for balance corrections  From green disk, STS 15x  with 3KG ball   Seated on green disk, OH press with 3KG ball      03/22/21  Completed gait training x 230 ft with RW, PT facilitating at pelvis for improved weight shift and cues for controlled stepping to promote stability. Then progressed to gait training with negotiation around obstacles (cones) x 3 laps, cues to stay aligned within walker, most challenge noted with sharp turns. CGA throughout. Continued cues for improved turn and alignment with surface prior to descent with sit <> stand.   Seated on green air disc without UE support, completed lateral weight shift to R/L x 15 reps working on control and cues for posture/core activation. Then progressed to weight shift with reaching for cone in various directions x 10 reps.   Tall Kneeling on Mat with BUE support on Bench positioned in front of patient. CGA to get into/out of position. In tall kneeling completed hip hinges 2 x 10 reps working on improved return to upright and maintian hip extension, cues to keep posture upright. Then completed alternating UE raises x 10 reps bilaterally. Lateral reaching to PT's hand x 5 reps bilateral directions. Then trialed no UE support, difficulty maintaining tall kneeling position without assistance.    03/20/21 Seated trunk flexion/extension holding 5# ball 15X  Seated SB holding 5# ball 15x with PT  stabilization dropping to each elbow  STS 15x with 5# ball SLR stabilization against blue t-band 60s duration x2 ea. LE DKTC over red physioball 15x   LTR 15x over red physioball  Scifit seat 20 arms 7 L2 x15mn   03/14/21  Seated marching, alt, 6# 15x per LE with latissimus press while seated on green disk to challenge seated balance  Seated LAQs 6# alt. 15x per LE with latissimus press while seated on green disk to challenge seated balance   STS with OH punch using yellow t-band 10x  SLR circles against red band, 60s each leg in supine position  Ambulation of 2064foutside of clinic with RW with CGA to accommodate facility fire drill  Supine/sit transitions 5x to ea. Side with random stopping points  Quadriped on elbows WS forward and back 10x  03/12/21 Ambulation of 46020fith RW and CGA, cued to pause walking before pivot turning to focus on task at hand and break down tasks into individual  components to control ataxia Seated on green disk, alternating marching and LAQs with lat press to activate core, 3# ankle weights, alternating pattern, 15 reps per LE Seated on disk, forward reaching using RW 10x              Seated core using disk and 3.3# ball using hip/shoulder tosses, chops and Vs for 10 reps per task  STS with OH reach once standing, 10 reps 03/09/21 Gait training: standing up from standard chair with large based quad cane walking up around cone (10 feet away) and sitting back down in chair: 2x, cues for 3step pattern and sequencing, min A to mod A required 1 x 230 feet with RW, CGA to min A with cues for smaller steps BBS performed 5x sit to stand performed.   03/01/2021 Pt seen for aquatic therapy today.  Treatment took place in water 3.25-4.8 ft [facility] Temp of water was 92. entered/exited the pool via stairs step to pattern independently with bilat rail.  Introduced pt to aquatic setting.  Amb using water walker.  forward, backward and side stepping/walking cues for increased step length, posture and weight shifting x 12 minutes. Pt requiring mod to min asst. Multiple LOB.  Seated -sit to stand transfers from water bench submerged to waist when standing.  Cueing for weight shift/execution. Initally completed with water walker, progressed to thick yellow noodle.  Pt requiring min-cga for completion. 2 x 5 Marching x 10 LAQ x 10.  Standing -step ups x leading with L/R LE.  Cueing for decreased impulsiveness /improved concentration on finishing first movement before moving to next. -toe raises 2 x 10 Cueing for balance/slowing movements  Supine suspension Flutter kicking x 5 min supported by Pt  Pt requires buoyancy for support and to offload joints with strengthening exercises. Viscosity of the water is needed for resistance of strengthening; water current perturbations provides challenge to standing balance unsupported, requiring increased core activation.   02/28/21: Gait training: 1 x 460 feet with tactile cues at pelvis to guide weight shifts cues for slower cadence and turning slowly Standing on floor: OH reaching and fwd reaching: alternating between reaches: 10x each Standing touching toes: 5x Gait training: chair to chair transfer with 180 deg turn: quad cane, CGA to min A: 4x  02/26/21: Gait training: 1 x 420 feet with tactile cues at pelvis to guide weight shifts cues for slower cadence and turning slowly Sit to stand: 10x no HHA Standing at countertop with wide BOS: moving basket with 20lbs from side to side to increase core engagement and weight shifts Picking up basket with 20lb from counter to chest: 5x Working on ankle strategy: standing heel and toe raises without HHA: very close guarding: 2 x 10  Standing contralateral reaching: 10x R and L    PATIENT EDUCATION: Education details: Scheduling more PT visits for October Person educated: Patient and caregiver Education method:  Consulting civil engineer, Media planner, and Verbal cues Education comprehension: verbalized understanding     HOME EXERCISE PROGRAM: Refrain from ambulation due to R achilles injury  ASSESSMENT:   CLINICAL IMPRESSION:  Todays session continued to focus on gait training with walker and cane with weighted L ankle.  Several episodes of LOB noted with cane requiring ModA to prevent fall.  Reassessed with RW and less stability needed to maintain balance.  Continued seated balance tasks on disk focused on core training adding LE tasks to dissociate [place] from [place].  Continued need of Vcs for safety and proper technique when sitting, especially reaching back to sitting surface.    REHAB POTENTIAL: Good   CLINICAL DECISION MAKING: Stable/uncomplicated  GOALS: Goals reviewed with patient? Yes   SHORT TERM GOALS:   STG Name Target Date Goal status  1 Patient will be able to perform chair to chair transfer with CGA/SBA with use of RW for safety to improve independence. Baseline: min to mod A (12/26/20); CGA/minA (03/09/21) 01/23/2021 03/27/21 Able to transfer STS and stand pivot with SBA, goal met  2 Pt will be able to ambulate 115' with RW with CGA to min A with RW for safety to improve independence Baseline: min to mod A with RW 145'; 230 feet with RW and CGA to MIN A 01/23/2021 03/09/21 Goal met  3 Pt will be able to maintain standing balance for 30 sec to improve static balance Baseline: 10 sec before needing help due to LOB (12/26/20) 01/23/2021 02/05/21 able to stand with S for 30s, goal met  4. Pt will be able to perform sit to stand transfers with SBA  to improve independence 04/06/21 Goal met 03/27/21  5. Pt will be able to perform 230 feet of walking with RW with SBA/CGA to improve household ambulation  04/06/21 Revised 03/09/21 04/03/21 UTA due to achilles partial tear    LONG TERM GOALS:   LTG Name Target Date Goal status  1 Pt will demo >5 points improvement on Berg Balance Scale to improve static  balance Baseline: 11/56 01/03/21 02/20/2021 INITIAL 03/09/21 BBS 21/56 04/03/21 UTA due to achilles partial tear  2 Pt will be able to ambulate >500 feet with RW and SBA to improve walking endurance and safety. Baseline: 145' with RW min to mod A; 230 feet with CGA to Min A (03/09/21)  05/18/21 Progressing continue  04/03/21 UTA due to achilles partial tear  3 Pt  will be able to perform gait speed improvement by 0.43ms to improve community ambulation. Baseline:0.242m (03/09/21) 05/16/21 Progressing continue 04/03/21 UTA due to achilles partial tear  4 Pt will demo 5 sec improvement in 5x sit to stand (with or without UE support) to improve functional strength Baseline: 5x sit to stand with bil UE 17 sec;  05/18/21 Progressing continue 04/03/21 Assessed at 23.5s, performance limited by R walking boot    PLAN: PT FREQUENCY: 2-3x/week   PT DURATION: 8 weeks   PLANNED INTERVENTIONS: Aquatic therapy, Therapeutic exercises, Therapeutic activity, Neuro Muscular re-education, Balance training, Gait training, Patient/Family education, Joint mobilization, Stair training, Visual/preceptual remediation/compensation, Wheelchair mobility training, Cryotherapy, Moist heat, and Manual therapy   PLAN FOR NEXT SESSION:  Continue core and balance strengthening until patient becomes more proficient with R walking boot.  Review transfer safety and proper technique  Visit Diagnosis: Ataxia  Unsteadiness on feet  Muscle weakness (generalized)     Problem List Patient Active Problem List   Diagnosis Date Noted   Thrombosis    Sleep disturbance    Dysphagia, post-stroke    PAF (paroxysmal atrial fibrillation) ([facility]   Acute pulmonary embolism without acute cor pulmonale (HCC)    Malnutrition of moderate degree 11/04/2020   Tracheostomy care ([facility]   Pressure injury of skin 10/23/2020   Intracranial hemorrhage (HCC)    Atrial fibrillation ([facility]   Prediabetes    Leukocytosis    Acute blood loss anemia     Hypernatremia    ICH (intracerebral hemorrhage) ([facility] [place]03/28/2022   Unilateral primary osteoarthritis, left knee 09/26/2016   Atrial flutter ([facility]03/28/2015   Near syncope 09/25/2013    JeLanice ShirtsPT 04/11/2021, 11:23 AM  [place] [place]ne[address] Vine LaneuDentsvillerChocowinityNCAlaska[address]hone: [phone] Fax:  [phone]

## 2021-04-11 NOTE — Therapy (Signed)
[facility] [address] [place], [place], [address] Phone: [phone]   Fax:  [phone]  Physical Therapy Treatment  Patient Details  Name: CLAUS SILVESTRO MRN: [id number] Date of Birth: Jul 08, 1955 No data recorded  Encounter Date: 04/11/2021   PT End of Session - 04/11/21 1017     Visit Number 33    Number of Visits 44    Date for PT Re-Evaluation 05/04/21    Authorization Type Medicare 12/26/20 to 02/20/21; Recert 02/21/21 to 04/26/21; 10v PN/recert on 03/09/21 to 05/04/21    Authorization Time Period 03/09/21-05/04/21    Progress Note Due on Visit 40    PT Start Time 1015    PT Stop Time 1100    PT Time Calculation (min) 45 min    Equipment Utilized During Treatment Gait belt    Activity Tolerance Patient tolerated treatment well    Behavior During Therapy WFL for tasks assessed/performed             Past Medical History:  Diagnosis Date   Atrial flutter (HCC)    s/p ablation   DVT (deep venous thrombosis) (HCC)    Near syncope 09/25/2013   Paroxysmal atrial fibrillation (HCC)    Stroke [facility])    initially hemorragic (not on [place]) followed by subsequent embolic stroke    Past Surgical History:  Procedure Laterality Date   A-FLUTTER ABLATION N/A 03/09/2020   Procedure: A-FLUTTER ABLATION;  Surgeon: Hillis Range, MD;  Location: [facility]AS[facility] LAB;  Service: Cardiovascular;  Laterality: N/A;   CARDIOVERSION N/A 09/27/2013   Procedure: CARDIOVERSION;  Surgeon: Lewayne Bunting, MD;  Location: [facility] ENDOSCOPY;  Service: Cardiovascular;  Laterality: N/A;   IR ANGIO EXTERNAL CAROTID SEL EXT CAROTID UNI R MOD SED  09/26/2020   IR ANGIO INTRA EXTRACRAN SEL COM CAROTID INNOMINATE BILAT MOD SED  09/26/2020   IR ANGIO VERTEBRAL SEL VERTEBRAL UNI R MOD SED  09/26/2020   IR IVC FILTER PLMT / S&I /IMG GUID/MOD SED  10/08/2020   IR IVUS EACH ADDITIONAL NON CORONARY VESSEL  11/02/2020   IR PTA VENOUS EXCEPT DIALYSIS CIRCUIT   11/02/2020   IR RADIOLOGIST EVAL & MGMT  01/17/2021   IR THROMBECT VENO MECH MOD SED  10/27/2020   IR THROMBECT VENO MECH MOD SED  11/02/2020   IR US GUIDE VASC ACCESS LEFT  10/27/2020   IR US GUIDE VASC ACCESS RIGHT  09/26/2020   IR US GUIDE VASC ACCESS RIGHT  11/02/2020   IR VENO/EXT/UNI RIGHT  11/02/2020   IR VENOCAVAGRAM IVC  10/27/2020   RETINAL DETACHMENT SURGERY     right knee arthroscopy     TEE WITHOUT CARDIOVERSION N/A 09/27/2013   Procedure: TRANSESOPHAGEAL ECHOCARDIOGRAM (TEE);  Surgeon: Lewayne Bunting, MD;  Location: [facility] ENDOSCOPY;  Service: Cardiovascular;  Laterality: N/A;   WRIST SURGERY      There were no vitals filed for this visit.                                            Patient will benefit from skilled therapeutic intervention in order to improve the following deficits and impairments:     Visit Diagnosis: Ataxia  Unsteadiness on feet  Muscle weakness (generalized)     Problem List Patient Active Problem List   Diagnosis Date Noted   Thrombosis    Sleep disturbance  Dysphagia, post-stroke    PAF (paroxysmal atrial fibrillation) (HCC)    Acute pulmonary embolism without acute cor pulmonale (HCC)    Malnutrition of moderate degree 11/04/2020   Tracheostomy care (HCC)    Pressure injury of skin 10/23/2020   Intracranial hemorrhage (HCC)    Atrial fibrillation (HCC)    Prediabetes    Leukocytosis    Acute blood loss anemia    Hypernatremia    ICH (intracerebral hemorrhage) (HCC) 09/25/2020   Unilateral primary osteoarthritis, left knee 09/26/2016   Atrial flutter (HCC) 09/25/2013   Near syncope 09/25/2013    Hildred Laser, PT 04/11/2021, 10:19 AM  [facility] [address] [place], [place], [address] Phone: [phone]   Fax:  [phone]  Name: SHAWNN BOUILLON MRN: [id number] Date of Birth: 1955-11-03

## 2021-04-11 NOTE — Therapy (Signed)
[facility] [address] [place], [place], [address] Phone: [phone]   Fax:  [phone]  Occupational Therapy Treatment  Patient Details  Name: Walter [name] MRN: [id number] Date of Birth: 1955-07-21 Referring Provider (OT): Dr. Alysia Penna   Encounter Date: 04/11/2021   OT End of Session - 04/11/21 1106     Visit Number 29    Number of Visits 72    Date for OT Re-Evaluation 04/27/21    Authorization Type Medicare Part A & B **need to add KX**    Authorization - Visit Number 29    Authorization - Number of Visits 30    Progress Note Due on Visit 51    OT Start Time 1100    OT Stop Time 1145    OT Time Calculation (min) 45 min    Activity Tolerance Patient tolerated treatment well    Behavior During Therapy [facility] for tasks assessed/performed             Past Medical History:  Diagnosis Date   Atrial flutter ([place])    s/p ablation   DVT (deep venous thrombosis) ([place])    Near syncope 09/25/2013   Paroxysmal atrial fibrillation ([place])    Stroke ([place])    initially hemorragic (not on [place]) followed by subsequent embolic stroke    Past Surgical History:  Procedure Laterality Date   A-FLUTTER ABLATION N/A 03/09/2020   Procedure: A-FLUTTER ABLATION;  Surgeon: Thompson Grayer, MD;  Location: [facility] CV LAB;  Service: Cardiovascular;  Laterality: N/A;   CARDIOVERSION N/A 09/27/2013   Procedure: CARDIOVERSION;  Surgeon: Lelon Perla, MD;  Location: [facility] ENDOSCOPY;  Service: Cardiovascular;  Laterality: N/A;   IR ANGIO EXTERNAL CAROTID SEL EXT CAROTID UNI R MOD SED  09/26/2020   IR ANGIO INTRA EXTRACRAN SEL COM CAROTID INNOMINATE BILAT MOD SED  09/26/2020   IR ANGIO VERTEBRAL SEL VERTEBRAL UNI R MOD SED  09/26/2020   IR IVC FILTER PLMT / S&I /IMG GUID/MOD SED  10/08/2020   IR IVUS EACH ADDITIONAL NON CORONARY VESSEL  11/02/2020   IR PTA VENOUS EXCEPT DIALYSIS CIRCUIT  11/02/2020   IR RADIOLOGIST EVAL & MGMT   01/17/2021   IR THROMBECT VENO MECH MOD SED  10/27/2020   IR THROMBECT VENO MECH MOD SED  11/02/2020   IR US GUIDE VASC ACCESS LEFT  10/27/2020   IR US GUIDE VASC ACCESS RIGHT  09/26/2020   IR US GUIDE VASC ACCESS RIGHT  11/02/2020   IR VENO/EXT/UNI RIGHT  11/02/2020   IR VENOCAVAGRAM IVC  10/27/2020   RETINAL DETACHMENT SURGERY     right knee arthroscopy     TEE WITHOUT CARDIOVERSION N/A 09/27/2013   Procedure: TRANSESOPHAGEAL ECHOCARDIOGRAM (TEE);  Surgeon: Lelon Perla, MD;  Location: [facility] ENDOSCOPY;  Service: Cardiovascular;  Laterality: N/A;   WRIST SURGERY      There were no vitals filed for this visit.   Subjective Assessment - 04/11/21 1105     Subjective  Pt denies any pain and changes.    Patient is accompanied by: --   caregiver--Kelly   Pertinent History ICH of brainstem (complicated by hydrocephalus, DVTs, and tracheostomy).  PMH:  a-fib    Limitations fall risk, ataxia, impulsivity    Patient Stated Goals use L side of body better (decrease ataxia), stay active and heal brain    Currently in Pain? No/denies    Pain Score 0-No pain  Resistance Clothespins in standing 1-8# with placing pegs with LUE on antenna and then removing with LUE. Ataxia present but patient demonstrated good coordination with 1-3 drops. Pt took break between placing and removing in sitting. Pt required CGA for standing balance with RW.  Flipping Cards with LUE - increased time. - sorted the cards based on suit with increased time but with no errors.                      OT Short Term Goals - 04/05/21 1436       OT SHORT TERM GOAL #1   Title Pt/caregiver will be independent with initial HEP for LUE coordination and R hand strength.--    Time 4    Period Weeks    Status Achieved   needs continued reinforcement, pt is not using consistently   Target Date 03/02/21      OT SHORT TERM GOAL #2   Title Pt/caregiver will be independent with vision HEP and visual  compensation strategies.    Time 4    Period Weeks    Status Achieved   Pt/ family verbalize understanding of vision HEP, needs continued reinforcement of compensatory strategeis- pt is not performing head turns     OT SHORT TERM GOAL #3   Title Pt will improve LUE coordination for ADLs as shown by improving score on box and blocks test by at least 6.    Baseline L-21 blocks    Time 4    Period Weeks    Status On-going   25 blocks.  03/12/21:  24 blocks     OT SHORT TERM GOAL #4   Title Pt will perform simple snack prep/home maintenance task from w/c level mod I.    Time 4    Period Weeks    Status Achieved   met per pt's wife     OT SHORT TERM GOAL #5   Title Pt will verbalize understanding of compensation strategies for sensory deficts for incr safety.    Time 4    Period Weeks    Status Achieved   Pt verbalized that he has to be careful about temperature, needs additional reinforcement.  03/12/21:  met     OT SHORT TERM GOAL #6   Title Pt will improve R hand grip stength to at least 32lbs to assist with ADLs/opening containers.    Time 4    Period Weeks    Status Achieved   33.5     OT SHORT TERM GOAL #7   Title --               OT Long Term Goals - 04/10/21 1237       OT LONG TERM GOAL #1   Title Pt will be independent with updated HEP--    Time 12    Period Weeks    Status On-going      OT LONG TERM GOAL #2   Title Pt will improve L hand coordination for ADLs as shown by completing 9-hole peg test in less than 70sec.    Baseline 107sec    Time 12    Period Weeks    Status On-going   03/12/21:  78sec, 78sec (with multiple drops)     OT LONG TERM GOAL #3   Title Pt will be mod I with toileting.    Time 12    Period Weeks    Status Achieved   03/12/21:  supervision/CGA.  04/10/21:  met per pt report     OT LONG TERM GOAL #4   Title Pt will perform simple snack prep/home maintenance tasks in standing with supervision.    Time 12    Period Weeks    Status  On-going      OT LONG TERM GOAL #5   Title Pt will demo good safety awareness/compensation for sensory deficits and attention to LUE during functional tasks and transfers without cueing.    Time 12    Period Weeks    Status On-going      OT LONG TERM GOAL #6   Title Pt will perform simple environmental scanning/navigation with at least 90% accuracy for incr safety.    Time 12    Period Weeks    Status On-going   03/12/21:  70% with CGA for balance/ambulation     OT LONG TERM GOAL #7   Title Pt will perform all basic ADLS with no more than supervision.    Time 12    Period Weeks    Status On-going   03/12/21:  min A-supervision                  Plan - 04/11/21 1139     Clinical Impression Statement Pt demonstrate improved cooridnation but continues to demonstrate impulsive nature with transfers.    OT Occupational Profile and History Detailed Assessment- Review of Records and additional review of physical, cognitive, psychosocial history related to current functional performance    Occupational performance deficits (Please refer to evaluation for details): ADL's;IADL's;Work;Leisure;Social Participation    Body Structure / Function / Physical Skills ADL;Strength;Balance;Proprioception;UE functional use;IADL;Endurance;Vision;Mobility;Coordination;Decreased knowledge of precautions;FMC;GMC;Sensation    Cognitive Skills Attention;Safety Awareness;Memory;Perception;Problem Solve    Rehab Potential Good    Clinical Decision Making Several treatment options, min-mod task modification necessary    Comorbidities Affecting Occupational Performance: May have comorbidities impacting occupational performance    Modification or Assistance to Complete Evaluation  Min-Moderate modification of tasks or assist with assess necessary to complete eval    OT Frequency 3x / week    OT Duration 12 weeks    OT Treatment/Interventions Self-care/ADL training;Moist Heat;Fluidtherapy;DME and/or AE  instruction;Balance training;Therapeutic activities;Aquatic Therapy;Ultrasound;Therapeutic exercise;Cognitive remediation/compensation;Visual/perceptual remediation/compensation;Functional Mobility Training;Neuromuscular education;Cryotherapy;Energy conservation;Manual Therapy;Patient/family education    Plan transfers/safety, environmental scanning, simple home maintenance (WBAT RLE with boot)    Consulted and Agree with Plan of Care Family member/caregiver;Patient             Patient will benefit from skilled therapeutic intervention in order to improve the following deficits and impairments:   Body Structure / Function / Physical Skills: ADL, Strength, Balance, Proprioception, UE functional use, IADL, Endurance, Vision, Mobility, Coordination, Decreased knowledge of precautions, FMC, GMC, Sensation Cognitive Skills: Attention, Safety Awareness, Memory, Perception, Problem Solve     Visit Diagnosis: Ataxia  Unsteadiness on feet  Visuospatial deficit  Muscle weakness (generalized)  Other lack of coordination  Other disturbances of skin sensation  Frontal lobe and executive function deficit    Problem List Patient Active Problem List   Diagnosis Date Noted   Thrombosis    Sleep disturbance    Dysphagia, post-stroke    PAF (paroxysmal atrial fibrillation) (HCC)    Acute pulmonary embolism without acute cor pulmonale (HCC)    Malnutrition of moderate degree 11/04/2020   Tracheostomy care ([place])    Pressure injury of skin 10/23/2020   Intracranial hemorrhage (HCC)    Atrial fibrillation (HCC)    Prediabetes    Leukocytosis  Acute blood loss anemia    Hypernatremia    ICH (intracerebral hemorrhage) ([place]) 09/25/2020   Unilateral primary osteoarthritis, left knee 09/26/2016   Atrial flutter ([place]) 09/25/2013   Near syncope 09/25/2013    Zachery Conch, OT/L 04/11/2021, 2:14 PM  [facility] [address] [place], [place], [address] Phone: [phone]   Fax:  [phone]  Name: Walter [name] MRN: [id number] Date of Birth: 02/22/1956

## 2021-04-13 ENCOUNTER — Other Ambulatory Visit: Payer: Self-pay

## 2021-04-13 ENCOUNTER — Ambulatory Visit (HOSPITAL_BASED_OUTPATIENT_CLINIC_OR_DEPARTMENT_OTHER): Payer: Medicare Other | Admitting: Physical Therapy

## 2021-04-13 ENCOUNTER — Encounter: Payer: Medicare Other | Admitting: Occupational Therapy

## 2021-04-13 DIAGNOSIS — R293 Abnormal posture: Secondary | ICD-10-CM

## 2021-04-13 DIAGNOSIS — R2689 Other abnormalities of gait and mobility: Secondary | ICD-10-CM

## 2021-04-13 DIAGNOSIS — G8929 Other chronic pain: Secondary | ICD-10-CM

## 2021-04-13 DIAGNOSIS — R262 Difficulty in walking, not elsewhere classified: Secondary | ICD-10-CM

## 2021-04-13 DIAGNOSIS — R2681 Unsteadiness on feet: Secondary | ICD-10-CM

## 2021-04-13 DIAGNOSIS — R270 Ataxia, unspecified: Secondary | ICD-10-CM | POA: Diagnosis not present

## 2021-04-13 DIAGNOSIS — M6281 Muscle weakness (generalized): Secondary | ICD-10-CM

## 2021-04-13 NOTE — Therapy (Signed)
OUTPATIENT PHYSICAL THERAPY TREATMENT NOTE/PROGRESS NOTE Patient Name: Walter [name] MRN: [id number] DOB:[birth year], 65 y.o., male Today's Date: 04/13/2021  PCP: Alroy Dust, L.Marlou Sa, MD REFERRING PROVIDER: Alroy Dust, Carlean Jews.Marlou Sa, MD   See Note below for Objective Data and Assessment of Progress/Goals.     PT End of Session - 04/13/21 1232     Visit Number 34    Number of Visits 44    Date for PT Re-Evaluation 05/04/21    Authorization Type Medicare 12/26/20 to 02/20/21; Recert 03/23/29 to 07/62/26; 33H PN/recert on 11/02/54 to 25/6/38    Authorization Time Period 03/09/21-05/04/21    PT Start Time 1033    PT Stop Time 57    PT Time Calculation (min) 42 min                   Past Medical History:  Diagnosis Date   Atrial flutter ([place])    s/p ablation   DVT (deep venous thrombosis) ([place])    Near syncope 09/25/2013   Paroxysmal atrial fibrillation (HCC)    Stroke [facility])    initially hemorragic (not on [place]) followed by subsequent embolic stroke   Past Surgical History:  Procedure Laterality Date   A-FLUTTER ABLATION N/A 03/09/2020   Procedure: A-FLUTTER ABLATION;  Surgeon: Thompson Grayer, MD;  Location: [place] [facility] LAB;  Service: Cardiovascular;  Laterality: N/A;   CARDIOVERSION N/A 09/27/2013   Procedure: CARDIOVERSION;  Surgeon: Lelon Perla, MD;  Location: [facility] ENDOSCOPY;  Service: Cardiovascular;  Laterality: N/A;   IR ANGIO EXTERNAL CAROTID SEL EXT CAROTID UNI R MOD SED  09/26/2020   IR ANGIO INTRA EXTRACRAN SEL COM CAROTID INNOMINATE BILAT MOD SED  09/26/2020   IR ANGIO VERTEBRAL SEL VERTEBRAL UNI R MOD SED  09/26/2020   IR IVC FILTER PLMT / S&I /IMG GUID/MOD SED  10/08/2020   IR IVUS EACH ADDITIONAL NON CORONARY VESSEL  11/02/2020   IR PTA VENOUS EXCEPT DIALYSIS CIRCUIT  11/02/2020   IR RADIOLOGIST EVAL & MGMT  01/17/2021   IR THROMBECT VENO MECH MOD SED  10/27/2020   IR THROMBECT VENO MECH MOD SED  11/02/2020   IR US GUIDE VASC ACCESS LEFT  10/27/2020   IR US GUIDE VASC  ACCESS RIGHT  09/26/2020   IR US GUIDE VASC ACCESS RIGHT  11/02/2020   IR VENO/EXT/UNI RIGHT  11/02/2020   IR VENOCAVAGRAM IVC  10/27/2020   RETINAL DETACHMENT SURGERY     right knee arthroscopy     TEE WITHOUT CARDIOVERSION N/A 09/27/2013   Procedure: TRANSESOPHAGEAL ECHOCARDIOGRAM (TEE);  Surgeon: Lelon Perla, MD;  Location: [facility] ENDOSCOPY;  Service: Cardiovascular;  Laterality: N/A;   WRIST SURGERY     Patient Active Problem List   Diagnosis Date Noted   Thrombosis    Sleep disturbance    Dysphagia, post-stroke    PAF (paroxysmal atrial fibrillation) ([place])    Acute pulmonary embolism without acute cor pulmonale (HCC)    Malnutrition of moderate degree 11/04/2020   Tracheostomy care ([place])    Pressure injury of skin 10/23/2020   Intracranial hemorrhage (HCC)    Atrial fibrillation (HCC)    Prediabetes    Leukocytosis    Acute blood loss anemia    Hypernatremia    ICH (intracerebral hemorrhage) ([place]) 09/25/2020   Unilateral primary osteoarthritis, left knee 09/26/2016   Atrial flutter ([place]) 09/25/2013   Near syncope 09/25/2013    REFERRING DIAG: I61.3 (ICD-10-CM) - 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 ([place]) I69.193 (ICD-10-CM) - Ataxia due to  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   THERAPY DIAG:  Abnormal posture  Chronic pain of right knee  Difficulty in walking, not elsewhere classified  Muscle weakness (generalized)  Unsteadiness on feet  Other abnormalities of gait and mobility  PERTINENT HISTORY: atrial flutter s/p ablation, DVT, near syncope, CVA  PRECAUTIONS: fall  SUBJECTIVE: "I brought my goggles, want to try swimming with them"  TODAY'S TREATMENT:      04/13/21 Pt seen for aquatic therapy today.  Treatment took place in water 3.25-4.8 ft in depth at the [facility] pool. Temp of water was 92.Pt entered/exited the pool via stairs step to pattern independently with bilat rail.                           Amb progressed to  with  and without hand buoys forward, backward (posterior chain activation) and side stepping/walking cues for increased step length, posture and weight shifting x 4 lengths of pool. Pt requiring min assist with backward amb.   Standing -hip add/abd and extension x 15 reps holding to pool wall. VC for increases speed to increase resistance.   -hip extension 2 x 10 -step ups 70% submerged x15 R/L -step over (step) vc for placement and control of le/core x 10  Gait training 50% submerged cues for direct path, control of le/placement of feet.     Supine suspension Standing<>supine using 2 foam hand buoys x5 reps holding position x 10 second each. Vc and demo with min TC to gain position -in above position knees to chest with assist from therapist to maintain position 3 x 10 core strengthening   Aerobic capacity Swimming crawl and breast stroke 6 lengths of pool sba continuously.  VC and TC for straight path   Pt requires buoyancy for support and to offload joints with strengthening exercises. Viscosity of the water is needed for resistance of strengthening; water current perturbations provides challenge to standing balance unsupported, requiring increased core activation.     04/11/21  Seated on blue disk, forward reaching to floor wih 4KG ball to lap and repeated w/o ball to vary resistance levels and have patient adapt  Searwd on blue disk, lifting 4KG ball OH and repeating w/o ball to challenge core and adapt to varied resistance challenges   Seated LAQs on disk, 6# wts on L ankle, boot on R  with lat press alternating pattern, 1x15  Seated marching on disk, 6# wts on L ankle, boot on R, with lat press alternating pattern 1x15  15x STS performing OH press against red band once standing, CGA needed for stability  156f ambulation with 3# weighted cane, 6# on L ankle, requiring Min/ModA to prevent fall  1172fambulaton with 6# weighted L ankle and RW with Min/ModA needed to  stabilize   04/10/21  PRacticed bathroom transfer: ambulated 100 feet with RW to bathroom with SBA for trasnfers and walking. Pt needed verbal cues to make sure he has walker in front of him when he is getting off the toilet so he doesn't have to reach for it on his side. Biodex harness suppoted treadmill walking: harness for safety only, 100% Body weight - 1.60m85mfor 3' with bil HHA, cues for controlled heel strike, "make your steps quieter" Rest - 1.4mp50mor 3' with 5% incline and then 1.8mph68mr 3' with 0% incline: total 6', bil HHA        04/06/21 Pt seen for aquatic therapy today.  Treatment took place  in water 3.25-4.8 ft in depth at the [facility] pool. Temp of water was 92.Pt entered/exited the pool via stairs step to pattern independently with bilat rail.                           Amb progressed to hand buoys forward, backward (posterior chain activation) and side stepping/walking cues for increased step length, posture and weight shifting x 4 lengths of pool. Pt requiring min assist with backward amb.   Standing -hip add/abd and extension x 15 reps holding to pool wall. VC for increases speed to increase resistance.  Attempted completion with support of ue buoys but pt unable to maintain standing posture -hip extension 2 x 10 -water walking 4 widths holding 2 foam buoys at hips/submerged    Supine suspension Standing<>supine using 2 foam hand buoys x7 reps holding position x 10 second each. Vc and demo with min TC to gain position -in above position knees to chest with assist from therapist to maintain position 3 x 10 core strengthening   Aerobic capacity Using yellow noodle flutter kicking and frog kicking 6 lengths, pt able to maintain position indep "Swimming" 4 lengths breast and crawl stroke with cga for maintaining straight path.   Pt requires buoyancy for support and to offload joints with strengthening exercises. Viscosity of the water is needed for resistance  of strengthening; water current perturbations provides challenge to standing balance unsupported, requiring increased core activation.    04/03/21  Seated on green disk, forward reaching to floor wih 2.5KG ball followed by [facility] reaching ea. For 20 reps, single movement Seated trunk flexion/extension over sitting disk, extension blocked by bolster, 15x with 2.5KG ball  Seated B rotation holding 2.5KG ball 20x, hip to hip  Seated LAQs on disk, 4# wts, with lat press alternating pattern, 1x20  Seated marching on disk, 4# wts with lat press alternating pattern 1x20  Supine SLR circled CW/CCW against red band 2 min duration ea. LE  5x STS in 23.5s   03/29/21  Seated on green disk, forward reaching to floor wih 2KG ball followed by [facility] reaching ea. For 20 reps, single movement Seated trunk flexion/extension over sitting disk, extension blocked by bolster, 15x.  Seated B rotation holding 2KG ball 20x, hip to hip  Seated LAQs on disk, 3# wts, with lat press alternating pattern, 2x15  Seated marching on disk, 3# wts with lat press alternating pattern  Supine SLR circled CW/CCW against red band 1 min duration ea. LE  Scifit L4 8'       03/27/21  Seated on green disk, forward reaching to floor wih 3KG ball followed by [facility] reaching ea. For 15 reps Seated trunk flexion/extension over sitting disk, extension blocked by bolster, 15x.  Seated B rotation holding 3KG ball 15x, hip to hip  Ambulation with QC 60f, SPC 336fthen SPMontgomery Surgical Centerith 3# wt 11541frequiring Min/ModA for balance corrections  From green disk, STS 15x with 3KG ball   Seated on green disk, OH press with 3KG ball      03/22/21  Completed gait training x 230 ft with RW, PT facilitating at pelvis for improved weight shift and cues for controlled stepping to promote stability. Then progressed to gait training with negotiation around obstacles (cones) x 3 laps, cues to stay aligned within walker, most challenge noted with sharp turns. CGA throughout.  Continued cues for improved turn and alignment with surface prior to descent with sit <>  stand.   Seated on green air disc without UE support, completed lateral weight shift to R/L x 15 reps working on control and cues for posture/core activation. Then progressed to weight shift with reaching for cone in various directions x 10 reps.   Tall Kneeling on Mat with BUE support on Bench positioned in front of patient. CGA to get into/out of position. In tall kneeling completed hip hinges 2 x 10 reps working on improved return to upright and maintian hip extension, cues to keep posture upright. Then completed alternating UE raises x 10 reps bilaterally. Lateral reaching to PT's hand x 5 reps bilateral directions. Then trialed no UE support, difficulty maintaining tall kneeling position without assistance.    03/20/21 Seated trunk flexion/extension holding 5# ball 15X  Seated SB holding 5# ball 15x with PT stabilization dropping to each elbow  STS 15x with 5# ball SLR stabilization against blue t-band 60s duration x2 ea. LE DKTC over red physioball 15x   LTR 15x over red physioball  Scifit seat 20 arms 7 L2 x4mn   03/14/21  Seated marching, alt, 6# 15x per LE with latissimus press while seated on green disk to challenge seated balance  Seated LAQs 6# alt. 15x per LE with latissimus press while seated on green disk to challenge seated balance   STS with OH punch using yellow t-band 10x  SLR circles against red band, 60s each leg in supine position  Ambulation of 2043foutside of clinic with RW with CGA to accommodate facility fire drill  Supine/sit transitions 5x to ea. Side with random stopping points  Quadriped on elbows WS forward and back 10x  03/12/21 Ambulation of 46053fith RW and CGA, cued to pause walking before pivot turning to focus on task at hand and break down tasks into individual  components to control ataxia Seated on green disk, alternating marching and LAQs with lat press to activate  core, 3# ankle weights, alternating pattern, 15 reps per LE Seated on disk, forward reaching using RW 10x              Seated core using disk and 3.3# ball using hip/shoulder tosses, chops and Vs for 10 reps per task  STS with OH reach once standing, 10 reps 03/09/21 Gait training: standing up from standard chair with large based quad cane walking up around cone (10 feet away) and sitting back down in chair: 2x, cues for 3step pattern and sequencing, min A to mod A required 1 x 230 feet with RW, CGA to min A with cues for smaller steps BBS performed 5x sit to stand performed.   03/01/2021 Pt seen for aquatic therapy today.  Treatment took place in water 3.25-4.8 [facility] Temp of water was 92. entered/exited the pool via stairs step to pattern independently with bilat rail.  Introduced pt to aquatic setting.                           Amb using water walker. forward, backward and side stepping/walking cues for increased step length, posture and weight shifting x 12 minutes. Pt requiring mod to min asst. Multiple LOB.  Seated -sit to stand transfers from water bench submerged to waist when standing.  Cueing for weight shift/execution. Initally completed with water walker, progressed to thick yellow noodle.  Pt requiring min-cga for completion. 2 x 5 Marching x 10 LAQ x 10.  Standing -step ups x  leading with L/R LE.  Cueing for decreased impulsiveness /improved concentration on finishing first movement before moving to next. -toe raises 2 x 10 Cueing for balance/slowing movements  Supine suspension Flutter kicking x 5 min supported by Pt  Pt requires buoyancy for support and to offload joints with strengthening exercises. Viscosity of the water is needed for resistance of strengthening; water current perturbations provides challenge to standing balance unsupported, requiring increased core activation.   02/28/21: Gait training: 1 x 460 feet with tactile  cues at pelvis to guide weight shifts cues for slower cadence and turning slowly Standing on floor: OH reaching and fwd reaching: alternating between reaches: 10x each Standing touching toes: 5x Gait training: chair to chair transfer with 180 deg turn: quad cane, CGA to min A: 4x  02/26/21: Gait training: 1 x 420 feet with tactile cues at pelvis to guide weight shifts cues for slower cadence and turning slowly Sit to stand: 10x no HHA Standing at countertop with wide BOS: moving basket with 20lbs from side to side to increase core engagement and weight shifts Picking up basket with 20lb from counter to chest: 5x Working on ankle strategy: standing heel and toe raises without HHA: very close guarding: 2 x 10  Standing contralateral reaching: 10x R and L    PATIENT EDUCATION: Education details: Scheduling more PT visits for October Person educated: Patient and caregiver Education method: Consulting civil engineer, Media planner, and Verbal cues Education comprehension: verbalized understanding     HOME EXERCISE PROGRAM: Refrain from ambulation due to R achilles injury  ASSESSMENT:   CLINICAL IMPRESSION:  No complaints of left ankle discomfort.  Worked on Press photographer.  Cues needed for erect posture in pool as he tends to offload weight from le and core using buoyancy to stay vertical. Pt with skill at swimming strokes.  End of session/aerobic capacity put on goggles and swam 6-8  lengths alternating between crawl and breast stroke.  Pt able to maintain prone position and execute strokes with good  coordination, does struggle maintaining straight path.  Pt encouraged to gain access to an indoor pool upon DC to continue progressing strength.    REHAB POTENTIAL: Good   CLINICAL DECISION MAKING: Stable/uncomplicated  GOALS: Goals reviewed with patient? Yes   SHORT TERM GOALS:   STG Name Target Date Goal status  1 Patient will be able to perform chair to chair transfer with CGA/SBA with  use of RW for safety to improve independence. Baseline: min to mod A (12/26/20); CGA/minA (03/09/21) 01/23/2021 03/27/21 Able to transfer STS and stand pivot with SBA, goal met  2 Pt will be able to ambulate 115' with RW with CGA to min A with RW for safety to improve independence Baseline: min to mod A with RW 145'; 230 feet with RW and CGA to MIN A 01/23/2021 03/09/21 Goal met  3 Pt will be able to maintain standing balance for 30 sec to improve static balance Baseline: 10 sec before needing help due to LOB (12/26/20) 01/23/2021 02/05/21 able to stand with S for 30s, goal met  4. Pt will be able to perform sit to stand transfers with SBA to improve independence 04/06/21 Goal met 03/27/21  5. Pt will be able to perform 230 feet of walking with RW with SBA/CGA to improve household ambulation  04/06/21 Revised 03/09/21 04/03/21 UTA due to achilles partial tear    LONG TERM GOALS:   LTG Name Target Date Goal status  1 Pt will demo >5 points improvement on  Berg Balance Scale to improve static balance Baseline: 11/56 01/03/21 02/20/2021 INITIAL 03/09/21 BBS 21/56 04/03/21 UTA due to achilles partial tear  2 Pt will be able to ambulate >500 feet with RW and SBA to improve walking endurance and safety. Baseline: 145' with RW min to mod A; 230 feet with CGA to Min A (03/09/21)  05/18/21 Progressing continue  04/03/21 UTA due to achilles partial tear  3 Pt  will be able to perform gait speed improvement by 0.82ms to improve community ambulation. Baseline:0.214m (03/09/21) 05/16/21 Progressing continue 04/03/21 UTA due to achilles partial tear  4 Pt will demo 5 sec improvement in 5x sit to stand (with or without UE support) to improve functional strength Baseline: 5x sit to stand with bil UE 17 sec;  05/18/21 Progressing continue 04/03/21 Assessed at 23.5s, performance limited by R walking boot    PLAN: PT FREQUENCY: 2-3x/week   PT DURATION: 8 weeks   PLANNED INTERVENTIONS: Aquatic therapy, Therapeutic exercises,  Therapeutic activity, Neuro Muscular re-education, Balance training, Gait training, Patient/Family education, Joint mobilization, Stair training, Visual/preceptual remediation/compensation, Wheelchair mobility training, Cryotherapy, Moist heat, and Manual therapy   PLAN FOR NEXT SESSION:  Continue core and balance strengthening until patient becomes more proficient with R walking boot.  Review transfer safety and proper technique  Visit Diagnosis: Abnormal posture  Chronic pain of right knee  Difficulty in walking, not elsewhere classified  Muscle weakness (generalized)  Unsteadiness on feet  Other abnormalities of gait and mobility     Problem List Patient Active Problem List   Diagnosis Date Noted   Thrombosis    Sleep disturbance    Dysphagia, post-stroke    PAF (paroxysmal atrial fibrillation) ([place]   Acute pulmonary embolism without acute cor pulmonale (HCC)    Malnutrition of moderate degree 11/04/2020   Tracheostomy care ([place]CD[place]   Pressure injury of skin 10/23/2020   Intracranial hemorrhage (HCC)    Atrial fibrillation ([place]   Prediabetes    Leukocytosis    Acute blood loss anemia    Hypernatremia    ICH (intracerebral hemorrhage) ([place]03/28/2022   Unilateral primary osteoarthritis, left knee 09/26/2016   Atrial flutter ([place]03/28/2015   Near syncope 09/25/2013    MaVedia PereyraMPT 04/13/2021, 1:13 PM  [facility] [address][id number]: [phone] Fax:  [phone]

## 2021-04-16 ENCOUNTER — Encounter: Payer: Self-pay | Admitting: Neurology

## 2021-04-16 ENCOUNTER — Ambulatory Visit (INDEPENDENT_AMBULATORY_CARE_PROVIDER_SITE_OTHER): Payer: Medicare Other | Admitting: Neurology

## 2021-04-16 VITALS — BP 137/76 | HR 76 | Ht 75.0 in | Wt 200.0 lb

## 2021-04-16 DIAGNOSIS — I639 Cerebral infarction, unspecified: Secondary | ICD-10-CM | POA: Diagnosis not present

## 2021-04-16 DIAGNOSIS — I6990 Unspecified sequelae of unspecified cerebrovascular disease: Secondary | ICD-10-CM | POA: Diagnosis not present

## 2021-04-16 DIAGNOSIS — G119 Hereditary ataxia, unspecified: Secondary | ICD-10-CM

## 2021-04-16 DIAGNOSIS — H532 Diplopia: Secondary | ICD-10-CM | POA: Diagnosis not present

## 2021-04-16 MED ORDER — TOPIRAMATE 25 MG PO TABS: 25.0000 mg | ORAL_TABLET | Freq: Two times a day (BID) | ORAL | 3 refills | Status: DC

## 2021-04-16 NOTE — Progress Notes (Signed)
[facility] [address] [place]. [place] [address] [phone]       OFFICE FOLLOW-UP NOTE  Mr. LORD LANCOUR Date of Birth:  [birth year] Medical Record Number:  [id number]   HPI: Initial visit 12/14/2020: Mr. Penrod is a 65 year old pleasant Caucasian male seen today for initial office follow-up visit following hospital admission for intracerebral hemorrhage in March 2022.  History is obtained from  patient and his wife and review of electronic medical records and personally reviewed pertinent imaging films in PACS.  He has past medical history of atrial flutter and newly diagnosed A. fib in February 2022 not on anticoagulation who presented on 09/25/2020 upon awakening from sleep with discomfort in his head and ear and slurred speech.  Upon arrival he was drowsy and sleepy with blood pressure in the 140-150 systolic range.  He required emergent intubation due to inability to protect his airway with a Glasgow Coma Scale of 5.  NIH stroke scale was 22.  He was also found to have right-sided weakness.  CT scan showed a left midbrain hemorrhage involving the dorsal part of the pons as well with extension into the cerebral aqueduct and fourth ventricle with local mass-effect on the brainstem but no hydrocephalus.  CT angiogram of the head and neck showed no large vessel occlusion or AV malformation.  ICH score was 3.  Patient was admitted to the intensive care unit.  He was started on hypertonic saline and had repeat CT scan which showed worsening hydrocephalus and neurosurgery was consulted who placed a right frontal ventriculostomy catheter.  MRI scan of the brain was obtained which confirmed acute hemorrhage within the left dorsal midbrain and pons extending to inferior medial thalamus with intraventricular extension.  There is no abnormal enhancement to suggest underlying structural or vascular lesion.  2D echo showed normal ejection fraction.  LDL cholesterol is 88 mg percent.   Hemoglobin A1c was 5.7.  Cerebral catheter angiogram was obtained and 09/27/2018 emergently which showed no evidence of AVM on formation, venous sinus thrombosis vascular abnormalities.  Patient showed neurological improvement after the EVD was placed and was following commands and opening his eyes.  The patient subsequent hospital course was complicated by development of deep vein thrombosis and pulmonary embolism eventually requiring to be started on anticoagulation.  He also required a tracheostomy and placement of IVC filter.  Long-term EEG monitoring showed triphasics but no definite seizure activity.  He was eventually weaned off ventilatory support and transferred to inpatient rehab on 10/26/2020 where he stayed for 4 weeks and made gradual improvement and was discharged home on 11/23/2020.  Patient was initially scheduled to see me in 2 months but the patient's wife had several questions about his follow-up care and I will prompting him to be seen sooner today.  Patient is currently at home he is getting home physical and occupational therapy.  He has significant truncal and hemiataxia and requires 1 person assist to even stand and can walk only using the safety belt and with the therapists.  Spent most of his time in the wheelchair.  His voice is very hypophonic.  He recently saw Dr. Ezzard Standing ENT who did the fiberoptic endoscopy and did not find any significant abnormalities of the vocal cords.  He was complaining of significant fatigue and tiredness and Dr. Wynn Banker recently added Provigil 100 mg in the morning.  Patient did have some placement sleep problems which appear to be more exacerbated in the last 2 weeks since starting Provigil.  He does take trazodone 50 mg at night but its not helping.  Patient has lot of questions about his timing and extent of recovery.  He is also read up on rehab trials as well as hyperbaric oxygen and has questions about using these.  He continues to have significant vertical  diplopia and is wearing an eye patch alternatingly on both eyes constantly.  He has seen Dr. Dione Booze who plans to see him back in July and may prescribe prisms if diplopia persists. Update 04/16/2021 : He returns for follow-up after last visit 4 months ago.  He is accompanied by his wife.  Patient continues to improve with ongoing therapies.  He is now able to ambulate and uses a walker for long distances.  He unfortunately sprained his right ankle and is currently wearing a boot at this has set him back a little bit.  His voice has improved but diplopia persists.  He has seen Dr. Karleen Hampshire neuro-ophthalmologist in [facility] who prescribed prisms but they do not seem to be working.  He is planning to see another ophthalmologist for a second opinion.  He did have repeat MRI scan done on 12/25/2020 which showed expected evolutionary changes in the left posterior midbrain hemorrhage with no under lying features to suggest vascular neoplastic lesion.  There was a tiny enhancing vessel at the bed of the hematoma which is of unclear significance.  There are also tiny chronic infarcts noted in the left frontal, parietal and bilateral cerebellar regions.  Patient has been taking Eliquis for his A. fib and tolerating it well without bleeding or bruising.  He has had no recurrent stroke or TIA symptoms.  He is also bothered by paresthesias in the right side of his body and is willing to try medications for it.  He can move around independently at home and in fact he has even been driving a bit. ROS:   14 system review of systems is positive for weakness, imbalance, walking difficulty, hoarseness of voice, double vision  PMH:  Past Medical History:  Diagnosis Date   Atrial flutter (HCC)    s/p ablation   DVT (deep venous thrombosis) (HCC)    Near syncope 09/25/2013   Paroxysmal atrial fibrillation (HCC)    Stroke (HCC)    initially hemorragic (not on OAC) followed by subsequent embolic stroke    Social History:   Social History   Socioeconomic History   Marital status: Married    Spouse name: cathy   Number of children: Not on file   Years of education: Not on file   Highest education level: Not on file  Occupational History   Not on file  Tobacco Use   Smoking status: Never   Smokeless tobacco: Never  Substance and Sexual Activity   Alcohol use: Yes    Comment: occasionally   Drug use: No   Sexual activity: Not on file  Other Topics Concern   Not on file  Social History Narrative   Lives with wife at home   Right Handed   Drinks decaf   Social Determinants of Health   Financial Resource Strain: Not on file  Food Insecurity: Not on file  Transportation Needs: Not on file  Physical Activity: Not on file  Stress: Not on file  Social Connections: Not on file  Intimate Partner Violence: Not on file    Medications:   Current Outpatient Medications on File Prior to Visit  Medication Sig Dispense Refill   azelastine (ASTELIN) 0.1 %  nasal spray      clonazepam (KLONOPIN) 0.125 MG disintegrating tablet Take 1 tablet (0.125 mg total) by mouth at bedtime as needed (sleep). (Patient taking differently: Take 0.125 mg by mouth as needed (sleep).) 20 tablet 0   desloratadine (CLARINEX) 5 MG tablet Take 5 mg by mouth daily.     diltiazem (CARDIZEM CD) 180 MG 24 hr capsule Take 1 capsule (180 mg total) by mouth daily. 90 capsule 3   EPINEPHrine 0.3 mg/0.3 mL IJ SOAJ injection      ketoconazole (NIZORAL) 2 % cream Apply topically.     mirtazapine (REMERON) 7.5 MG tablet Take 2 tablets (15 mg total) by mouth at bedtime. 180 tablet 0   sertraline (ZOLOFT) 100 MG tablet Take 100 mg by mouth daily.     No current facility-administered medications on file prior to visit.    Allergies:   Allergies  Allergen Reactions   Keppra [Levetiracetam] Swelling    Patient experienced angioedema post inpatient keppra dose   Latex Itching    Skin turns real red    Physical Exam General: well  developed, well nourished middle-aged Caucasian male, seated, in no evident distress Head: head normocephalic and atraumatic.  Neck: supple with no carotid or supraclavicular bruits Cardiovascular: regular rate and rhythm, no murmurs Musculoskeletal: no deformity.  Wearing right ankle boot for sprained ligament Skin:  no rash/petichiae Vascular:  Normal pulses all extremities Vitals:   04/16/21 1423  BP: 137/76  Pulse: 76   Neurologic Exam Mental Status: Awake and fully alert. Oriented to place and time. Recent and remote memory intact. Attention span, concentration and fund of knowledge appropriate. Mood and affect appropriate.  Speech is minimally dysarthric only occasionally. Cranial Nerves: Fundoscopic exam not done. Pupils equal, briskly reactive to light. Extraocular movements show skew deviation with left eye hypotrophic and moving less in vertical direction compared to the right.  Patient has binocular diplopia which abolishes with closing either eye. Visual fields full to confrontation. Hearing intact. Facial sensation intact. Face, tongue, palate moves normally and symmetrically.  Motor: Normal bulk and tone. Normal strength in all tested extremity muscles except mild weakness of left grip and intrinsic hand muscles.  Orbits right over left upper extremity.  Mild weakness of left hip flexors and ankle dorsiflexors. Sensory.:  Slightly impaired touch and pinprick on the right hemibody  .  coordination: Mild left finger-to-nose and mild left knee to heel ataxia.. Gait and Station: Gets up with only slight difficulty.  Broad-based gait with favoring of the right foot due to boot from ankle sprain.  Unsteady while standing on a narrow base.  Unable to do tandem walking Reflexes: 1+ and symmetric. Toes downgoing.   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  Prolonged hospitalization complicated by chronic atrial fibrillation, acute pulmonary embolism with respiratory failure, bilateral lower extremity DVT, tracheostomy, dysphagia poststroke and mild malnutrition.  Patient is making steady improvement with ongoing therapies but diplopia continues to be bothersome despite trying prisms..  Recently diagnosed with sleep apnea.     PLAN: I had a long discussion with the patient and his wife regarding his intracerebral hemorrhage and residual diplopia and left hemiataxia.  I recommend he continue ongoing physical occupational and speech therapy.  Continue Eliquis for stroke prevention given history of atrial flutter.  Repeat MRI scan of the  brain with and without contrast to rule out any small underlying structural or vascular lesion missed on the previous study.  Trial of Topamax 25 mg twice daily for his poststroke paresthesias.  I discussed side effects with patient and his wife and asked him to call me if needed.  Follow-up with his cardiologist for treatment for sleep apnea.  Return for follow-up in 6 months or call earlier if necessary. Greater than 50% of time during this 30 minute  visit was spent on counseling,explanation of diagnosis, planning of further management, discussion with patient and family and coordination of care Delia Heady, MD Note: This document was prepared with digital dictation and possible smart phrase technology. Any transcriptional errors that result from this process are unintentional

## 2021-04-16 NOTE — Patient Instructions (Signed)
I had a long discussion with the patient and his wife regarding his intracerebral hemorrhage and residual diplopia and left hemiataxia.  I recommend he continue ongoing physical occupational and speech therapy.  Continue Eliquis for stroke prevention given history of atrial flutter.  Repeat MRI scan of the brain with and without contrast to rule out any small underlying structural or vascular lesion missed on the previous study.  Trial of Topamax 25 mg twice daily for his poststroke paresthesias.  I discussed side effects with patient and his wife and asked him to call me if needed.  Return for follow-up in 6 months or call earlier if necessary.

## 2021-04-17 ENCOUNTER — Encounter: Payer: Self-pay | Admitting: Occupational Therapy

## 2021-04-17 ENCOUNTER — Other Ambulatory Visit: Payer: Self-pay

## 2021-04-17 ENCOUNTER — Ambulatory Visit: Payer: Medicare Other | Admitting: Occupational Therapy

## 2021-04-17 ENCOUNTER — Ambulatory Visit: Payer: Medicare Other

## 2021-04-17 ENCOUNTER — Ambulatory Visit: Payer: Medicare Other | Admitting: Speech Pathology

## 2021-04-17 ENCOUNTER — Encounter: Payer: Self-pay | Admitting: Speech Pathology

## 2021-04-17 DIAGNOSIS — R270 Ataxia, unspecified: Secondary | ICD-10-CM

## 2021-04-17 DIAGNOSIS — R208 Other disturbances of skin sensation: Secondary | ICD-10-CM

## 2021-04-17 DIAGNOSIS — R41842 Visuospatial deficit: Secondary | ICD-10-CM

## 2021-04-17 DIAGNOSIS — R41844 Frontal lobe and executive function deficit: Secondary | ICD-10-CM

## 2021-04-17 DIAGNOSIS — R41841 Cognitive communication deficit: Secondary | ICD-10-CM

## 2021-04-17 DIAGNOSIS — R278 Other lack of coordination: Secondary | ICD-10-CM

## 2021-04-17 DIAGNOSIS — M6281 Muscle weakness (generalized): Secondary | ICD-10-CM

## 2021-04-17 DIAGNOSIS — R2681 Unsteadiness on feet: Secondary | ICD-10-CM

## 2021-04-17 DIAGNOSIS — R293 Abnormal posture: Secondary | ICD-10-CM

## 2021-04-17 DIAGNOSIS — R2689 Other abnormalities of gait and mobility: Secondary | ICD-10-CM

## 2021-04-17 NOTE — Therapy (Signed)
[facility] [address] [place], [place], [address] Phone: [phone]   Fax:  [phone]  Occupational Therapy Treatment  Patient Details  Name: Walter [name] MRN: [id number] Date of Birth: 16-Nov-1955 Referring Provider (OT): Dr. Alysia Penna   Encounter Date: 04/17/2021   OT End of Session - 04/17/21 1256     Visit Number 30    Number of Visits 77    Date for OT Re-Evaluation 04/27/21    Authorization Type Medicare Part A & B **need to add KX**    Authorization - Visit Number 30    Authorization - Number of Visits 30    Progress Note Due on Visit 62    OT Start Time 1234    OT Stop Time 1315    OT Time Calculation (min) 41 min    Activity Tolerance Patient tolerated treatment well    Behavior During Therapy [facility] for tasks assessed/performed             Past Medical History:  Diagnosis Date   Atrial flutter ([place])    s/p ablation   DVT (deep venous thrombosis) ([place])    Near syncope 09/25/2013   Paroxysmal atrial fibrillation ([place])    Stroke ([place])    initially hemorragic (not on [place]) followed by subsequent embolic stroke    Past Surgical History:  Procedure Laterality Date   A-FLUTTER ABLATION N/A 03/09/2020   Procedure: A-FLUTTER ABLATION;  Surgeon: Thompson Grayer, MD;  Location: [facility] CV LAB;  Service: Cardiovascular;  Laterality: N/A;   CARDIOVERSION N/A 09/27/2013   Procedure: CARDIOVERSION;  Surgeon: Lelon Perla, MD;  Location: [facility] ENDOSCOPY;  Service: Cardiovascular;  Laterality: N/A;   IR ANGIO EXTERNAL CAROTID SEL EXT CAROTID UNI R MOD SED  09/26/2020   IR ANGIO INTRA EXTRACRAN SEL COM CAROTID INNOMINATE BILAT MOD SED  09/26/2020   IR ANGIO VERTEBRAL SEL VERTEBRAL UNI R MOD SED  09/26/2020   IR IVC FILTER PLMT / S&I /IMG GUID/MOD SED  10/08/2020   IR IVUS EACH ADDITIONAL NON CORONARY VESSEL  11/02/2020   IR PTA VENOUS EXCEPT DIALYSIS CIRCUIT  11/02/2020   IR RADIOLOGIST EVAL & MGMT   01/17/2021   IR THROMBECT VENO MECH MOD SED  10/27/2020   IR THROMBECT VENO MECH MOD SED  11/02/2020   IR US GUIDE VASC ACCESS LEFT  10/27/2020   IR US GUIDE VASC ACCESS RIGHT  09/26/2020   IR US GUIDE VASC ACCESS RIGHT  11/02/2020   IR VENO/EXT/UNI RIGHT  11/02/2020   IR VENOCAVAGRAM IVC  10/27/2020   RETINAL DETACHMENT SURGERY     right knee arthroscopy     TEE WITHOUT CARDIOVERSION N/A 09/27/2013   Procedure: TRANSESOPHAGEAL ECHOCARDIOGRAM (TEE);  Surgeon: Lelon Perla, MD;  Location: [facility] ENDOSCOPY;  Service: Cardiovascular;  Laterality: N/A;   WRIST SURGERY      There were no vitals filed for this visit.   Subjective Assessment - 04/17/21 1256     Subjective  Pt reports that he may go to [place] to attend an intensive 1 month therapy program.  Pt reports consistently toileting mod I during the day and dressing with min A for boot.  Pt reports that he does more on weekends when aide is not present.    Patient is accompanied by: --   caregiver--Kelly   Pertinent History ICH of brainstem (complicated by hydrocephalus, DVTs, and tracheostomy).  PMH:  a-fib    Limitations fall risk, ataxia, impulsivity  Patient Stated Goals use L side of body better (decrease ataxia), stay active and heal brain             Placing grooved pegs in pegboard with L hand with min-mod difficulty.  Pt distracted during task due to schedule concerns which seemed to impact LUE coordination.    Functional reaching with LUE to place/remove clothespins with 1-8lb resistance.  Pt demo good control placing clothespins, but decr control with release.  Environmental scanning/navigation in hall to look for objects with all objects found on first pass, but pt needed min cueing for/bumped into items on L side with RW.    Checked progress towards goals and with ADLs--see below.  Discussed impulsivity and continued need to watch hand, use controlled movement with releasing objects and to slow down/pause with visual scanning  as pt demo decr awareness of L side with distractions or divided/alternating attention.      OT Short Term Goals - 04/05/21 1436       OT SHORT TERM GOAL #1   Title Pt/caregiver will be independent with initial HEP for LUE coordination and R hand strength.--    Time 4    Period Weeks    Status Achieved   needs continued reinforcement, pt is not using consistently   Target Date 03/02/21      OT SHORT TERM GOAL #2   Title Pt/caregiver will be independent with vision HEP and visual compensation strategies.    Time 4    Period Weeks    Status Achieved   Pt/ family verbalize understanding of vision HEP, needs continued reinforcement of compensatory strategeis- pt is not performing head turns     OT SHORT TERM GOAL #3   Title Pt will improve LUE coordination for ADLs as shown by improving score on box and blocks test by at least 6.    Baseline L-21 blocks    Time 4    Period Weeks    Status On-going   25 blocks.  03/12/21:  24 blocks     OT SHORT TERM GOAL #4   Title Pt will perform simple snack prep/home maintenance task from w/c level mod I.    Time 4    Period Weeks    Status Achieved   met per pt's wife     OT SHORT TERM GOAL #5   Title Pt will verbalize understanding of compensation strategies for sensory deficts for incr safety.    Time 4    Period Weeks    Status Achieved   Pt verbalized that he has to be careful about temperature, needs additional reinforcement.  03/12/21:  met     OT SHORT TERM GOAL #6   Title Pt will improve R hand grip stength to at least 32lbs to assist with ADLs/opening containers.    Time 4    Period Weeks    Status Achieved   33.5     OT SHORT TERM GOAL #7   Title --               OT Long Term Goals - 04/17/21 1311       OT LONG TERM GOAL #1   Title Pt will be independent with updated HEP--    Time 12    Period Weeks    Status On-going      OT LONG TERM GOAL #2   Title Pt will improve L hand coordination for ADLs as shown by  completing 9-hole peg test in less  than 70sec.    Baseline 107sec    Time 12    Period Weeks    Status On-going   03/12/21:  78sec, 78sec (with multiple drops).  04/17/21:  72.69sec     OT LONG TERM GOAL #3   Title Pt will be mod I with toileting.    Time 12    Period Weeks    Status Achieved   03/12/21:  supervision/CGA.  04/10/21:  met per pt report     OT LONG TERM GOAL #4   Title Pt will perform simple snack prep/home maintenance tasks in standing with supervision.    Time 12    Period Weeks    Status On-going      OT LONG TERM GOAL #5   Title Pt will demo good safety awareness/compensation for sensory deficits and attention to LUE during functional tasks and transfers without cueing.    Time 12    Period Weeks    Status On-going   04/17/21:  pt with difficulty/bumping into items with RW on L side at times, pt demo impulsivity at times with movement or with distractions with LUE movement (particularly with release of objects)     OT LONG TERM GOAL #6   Title Pt will perform simple environmental scanning/navigation with at least 90% accuracy for incr safety.    Time 12    Period Weeks    Status On-going   03/12/21:  70% with CGA for balance/ambulation.  04/17/21:  100% with scanning for objects, but needed close supervision/min cueing for safety/bumping into items on L side     OT LONG TERM GOAL #7   Title Pt will perform all basic ADLS with no more than supervision.    Time 12    Period Weeks    Status On-going   03/12/21:  min A-supervision.  04/17/21:  min A for boot, min A for shower transfer, supervision for tolieting                  Plan - 04/17/21 1257     Clinical Impression Statement Progress Note Reporting Period 03/12/21-04/17/21:  Pt demo improving coordination, but impulsivity, particularly with distraction continues to affect functional use.  Pt also demo improved accuracy with visual scanning for objects, but continues to bump into items with RW at times  (less control and attention to L side of walker).  Pt does better with focusing on single task at a time.  Pt will benefit from continued occupational therapy to continue to address ataxia, coordination, attention, and functional mobility for incr safety and independence with ADLs/IADLs.    OT Occupational Profile and History Detailed Assessment- Review of Records and additional review of physical, cognitive, psychosocial history related to current functional performance    Occupational performance deficits (Please refer to evaluation for details): ADL's;IADL's;Work;Leisure;Social Participation    Body Structure / Function / Physical Skills ADL;Strength;Balance;Proprioception;UE functional use;IADL;Endurance;Vision;Mobility;Coordination;Decreased knowledge of precautions;FMC;GMC;Sensation    Cognitive Skills Attention;Safety Awareness;Memory;Perception;Problem Solve    Rehab Potential Good    Clinical Decision Making Several treatment options, min-mod task modification necessary    Comorbidities Affecting Occupational Performance: May have comorbidities impacting occupational performance    Modification or Assistance to Complete Evaluation  Min-Moderate modification of tasks or assist with assess necessary to complete eval    OT Frequency 3x / week    OT Duration 12 weeks    OT Treatment/Interventions Self-care/ADL training;Moist Heat;Fluidtherapy;DME and/or AE instruction;Balance training;Therapeutic activities;Aquatic Therapy;Ultrasound;Therapeutic exercise;Cognitive remediation/compensation;Visual/perceptual remediation/compensation;Functional Mobility Training;Neuromuscular education;Cryotherapy;Energy  conservation;Manual Therapy;Patient/family education    Plan continues transfers/safety, environmental scanning/navigation, simple home maintenance (WBAT RLE with boot)    Consulted and Agree with Plan of Care Family member/caregiver;Patient             Patient will benefit from skilled  therapeutic intervention in order to improve the following deficits and impairments:   Body Structure / Function / Physical Skills: ADL, Strength, Balance, Proprioception, UE functional use, IADL, Endurance, Vision, Mobility, Coordination, Decreased knowledge of precautions, [place], GMC, Sensation Cognitive Skills: Attention, Safety Awareness, Memory, Perception, Problem Solve     Visit Diagnosis: Ataxia  Abnormal posture  Muscle weakness (generalized)  Unsteadiness on feet  Visuospatial deficit  Other lack of coordination  Frontal lobe and executive function deficit  Other disturbances of skin sensation  Other abnormalities of gait and mobility    Problem List Patient Active Problem List   Diagnosis Date Noted   Thrombosis    Sleep disturbance    Dysphagia, post-stroke    PAF (paroxysmal atrial fibrillation) (HCC)    Acute pulmonary embolism without acute cor pulmonale (HCC)    Malnutrition of moderate degree 11/04/2020   Tracheostomy care ([place])    Pressure injury of skin 10/23/2020   Intracranial hemorrhage (HCC)    Atrial fibrillation (HCC)    Prediabetes    Leukocytosis    Acute blood loss anemia    Hypernatremia    ICH (intracerebral hemorrhage) ([place]) 09/25/2020   Unilateral primary osteoarthritis, left knee 09/26/2016   Atrial flutter ([place]) 09/25/2013   Near syncope 09/25/2013    [facility], OT/L 04/17/2021, 6:07 PM  [facility] [address] [place], [place], [address] Phone: [phone]   Fax:  [phone]  Name: Walter [name] MRN: [id number] Date of Birth: 08/25/1955  Vianne Bulls, OTR/L [facility] [address] [place], [place]  [address] [phone] phone [phone] 04/17/21 6:07 PM

## 2021-04-17 NOTE — Therapy (Signed)
[facility] [address] [place], [place], [address] Phone: [phone]   Fax:  [phone]  Speech Language Pathology Treatment  Patient Details  Name: Walter [name] MRN: [id number] Date of Birth: 15-Nov-1955 Referring Provider (SLP): Charlett Blake, MD   Encounter Date: 04/17/2021   End of Session - 04/17/21 1710     Visit Number 31    Number of Visits 35    Date for SLP Re-Evaluation 04/20/21    Authorization Type Medicare    SLP Start Time 1145    SLP Stop Time  1229    SLP Time Calculation (min) 44 min    Activity Tolerance Patient tolerated treatment well             Past Medical History:  Diagnosis Date   Atrial flutter ([place])    s/p ablation   DVT (deep venous thrombosis) ([place])    Near syncope 09/25/2013   Paroxysmal atrial fibrillation ([place])    Stroke [facility])    initially hemorragic (not on [facility]) followed by subsequent embolic stroke    Past Surgical History:  Procedure Laterality Date   A-FLUTTER ABLATION N/A 03/09/2020   Procedure: A-FLUTTER ABLATION;  Surgeon: Thompson Grayer, MD;  Location: [facility] CV LAB;  Service: Cardiovascular;  Laterality: N/A;   CARDIOVERSION N/A 09/27/2013   Procedure: CARDIOVERSION;  Surgeon: Lelon Perla, MD;  Location: [facility] ENDOSCOPY;  Service: Cardiovascular;  Laterality: N/A;   IR ANGIO EXTERNAL CAROTID SEL EXT CAROTID UNI R MOD SED  09/26/2020   IR ANGIO INTRA EXTRACRAN SEL COM CAROTID INNOMINATE BILAT MOD SED  09/26/2020   IR ANGIO VERTEBRAL SEL VERTEBRAL UNI R MOD SED  09/26/2020   IR IVC FILTER PLMT / S&I /IMG GUID/MOD SED  10/08/2020   IR IVUS EACH ADDITIONAL NON CORONARY VESSEL  11/02/2020   IR PTA VENOUS EXCEPT DIALYSIS CIRCUIT  11/02/2020   IR RADIOLOGIST EVAL & MGMT  01/17/2021   IR THROMBECT VENO MECH MOD SED  10/27/2020   IR THROMBECT VENO MECH MOD SED  11/02/2020   IR US GUIDE VASC ACCESS LEFT  10/27/2020   IR US GUIDE VASC ACCESS RIGHT  09/26/2020   IR  US GUIDE VASC ACCESS RIGHT  11/02/2020   IR VENO/EXT/UNI RIGHT  11/02/2020   IR VENOCAVAGRAM IVC  10/27/2020   RETINAL DETACHMENT SURGERY     right knee arthroscopy     TEE WITHOUT CARDIOVERSION N/A 09/27/2013   Procedure: TRANSESOPHAGEAL ECHOCARDIOGRAM (TEE);  Surgeon: Lelon Perla, MD;  Location: [facility] ENDOSCOPY;  Service: Cardiovascular;  Laterality: N/A;   WRIST SURGERY      There were no vitals filed for this visit.   Subjective Assessment - 04/17/21 1157     Subjective ""filling out to get medical records for a rehab place in [place]"    Patient is accompained by: --   CNA   Currently in Pain? No/denies                   ADULT SLP TREATMENT - 04/17/21 1159       General Information   Behavior/Cognition Alert;Cooperative;Pleasant mood;Requires cueing;Impulsive      Treatment Provided   Treatment provided Cognitive-Linquistic      Cognitive-Linquistic Treatment   Treatment focused on Cognition;Patient/family/caregiver education    Skilled Treatment [place] bought in [facility] complete. Targeted selected attention, problem solving and error awareness. in mild to moderately complex cognitive linguistic task, reading schedule for basketball tickets and golf fees and  completing mental math. [name] required usual min to mod A to ID 4 errors  in attending to the correct details. He spontaneously used written math cues when problem was more complex. At end of session, [name] required cues to push up from the arm rests, as he pulled up on the walker and fell into the chair.      Assessment / Recommendations / Plan   Plan Continue with current plan of care      Progression Toward Goals   Progression toward goals Progressing toward goals                SLP Short Term Goals - 04/17/21 1709       SLP SHORT TERM GOAL #1   Title Pt will complete dysarthria HEP with occasional min A over 2 sessions    Status Not Met      SLP SHORT TERM GOAL #2   Title Pt will produce loud /a/  or "hey!" with at least high 80s dB average over 2 sessions    Status Not Met      SLP SHORT TERM GOAL #3   Title Pt will generate abdominal breathing in 16/20 answers to SLP questions over 2 sessions    Baseline 01-18-21    Status Partially Met      SLP SHORT TERM GOAL #4   Title Pt will demonstrate dysarthria compensations in 10 minute simple to mod complex conversation to be 75% intelligibile with occasional min A over 2 sessions    Status Not Met      SLP SHORT TERM GOAL #5   Title Pt will complete formal cognitive linguistic assessment in first 1-2 ST sessions    Status Achieved      SLP SHORT TERM GOAL #6   Title Pt will use memory and attention compensations for appointments, medicine management, and other daily activities with occasional min A over 2 sessions    Status Not Met              SLP Long Term Goals - 04/17/21 1709       SLP LONG TERM GOAL #1   Title Pt will complete dysarthria HEP with rare min A over 2 sessions    Baseline 03-20-21, 03-22-21    Status Achieved      SLP LONG TERM GOAL #2   Title Pt will demonstrate dysarthria compensations in 10 minute simple conversation to be 90% intelligibile with rare min A over 3 sessions    Baseline 03-22-21; 03-29-21, 04-03-21    Status Achieved      SLP LONG TERM GOAL #3   Title Pt will demo improved sustained and selective attention during therapeutic tasks with less than 3 cues for active listening during 2 sessions    Baseline 04-05-21    Status Achieved      SLP LONG TERM GOAL #4   Title Pt will demonstrate anticipatory awareness of impulsivity with min cues to modify behaviors during 2 sessions    Baseline 04-11-21    Time 1    Period Weeks    Status On-going   and ongoing     SLP LONG TERM GOAL #5   Title Pt will report reduced frustration and improved communication effectiveness via PROM by 2 points at last ST sessions    Baseline CES: 15.5 & V-RQOL:35    Time 7    Period Weeks    Status On-going       SLP LONG TERM GOAL #  6   Title Pt will selectively attend to cognitive linguistic task for 5 minutes with 3 or less cues for redirection over 3 sessions    Baseline 04-03-21, 04-11-21    Status Achieved              Plan - 04/17/21 1708     Clinical Impression Statement "[name]" presents for OPST intervention secondary to stroke in March 2022.  Pt agrees to shift focus from speech to cognition. SLP targeted recall and attention related to functional tasks, in which occasional cues required to increase attention to detail and error awareness. Good recall and reasoning exhibited this session. See daily note for details. Skilled ST is cont'd warranted to address cognition to maximize communication effectiveness and optimize functional independence and safety at home.    Duration --   16 weeks   Treatment/Interventions Cueing hierarchy;Functional tasks;Patient/family education;Cognitive reorganization;Multimodal communcation approach;Language facilitation;Compensatory techniques;Internal/external aids;SLP instruction and feedback    Potential to Achieve Goals Fair    Potential Considerations Ability to learn/carryover information;Cooperation/participation level             Patient will benefit from skilled therapeutic intervention in order to improve the following deficits and impairments:   Cognitive communication deficit    Problem List Patient Active Problem List   Diagnosis Date Noted   Thrombosis    Sleep disturbance    Dysphagia, post-stroke    PAF (paroxysmal atrial fibrillation) ([place])    Acute pulmonary embolism without acute cor pulmonale (HCC)    Malnutrition of moderate degree 11/04/2020   Tracheostomy care ([place])    Pressure injury of skin 10/23/2020   Intracranial hemorrhage (HCC)    Atrial fibrillation ([place])    Prediabetes    Leukocytosis    Acute blood loss anemia    Hypernatremia    ICH (intracerebral hemorrhage) ([place]) 09/25/2020   Unilateral primary  osteoarthritis, left knee 09/26/2016   Atrial flutter ([place]) 09/25/2013   Near syncope 09/25/2013    Oleda Borski, Annye Rusk MS, CCC-SLP 04/17/2021, 5:11 PM  [facility] [address] [place], [place], [address] Phone: [phone]   Fax:  [phone]   Name: NASIIR MONTS MRN: [id number] Date of Birth: 07-07-1955

## 2021-04-19 ENCOUNTER — Encounter: Payer: Medicare Other | Attending: Physical Medicine & Rehabilitation | Admitting: Physical Medicine & Rehabilitation

## 2021-04-19 ENCOUNTER — Encounter: Payer: Self-pay | Admitting: Physical Medicine & Rehabilitation

## 2021-04-19 ENCOUNTER — Other Ambulatory Visit: Payer: Self-pay

## 2021-04-19 VITALS — BP 133/81 | HR 73 | Ht 75.0 in | Wt 200.0 lb

## 2021-04-19 DIAGNOSIS — I69351 Hemiplegia and hemiparesis following cerebral infarction affecting right dominant side: Secondary | ICD-10-CM | POA: Diagnosis present

## 2021-04-19 DIAGNOSIS — I613 Nontraumatic intracerebral hemorrhage in brain stem: Secondary | ICD-10-CM | POA: Diagnosis present

## 2021-04-19 DIAGNOSIS — I639 Cerebral infarction, unspecified: Secondary | ICD-10-CM | POA: Diagnosis not present

## 2021-04-19 DIAGNOSIS — I69319 Unspecified symptoms and signs involving cognitive functions following cerebral infarction: Secondary | ICD-10-CM | POA: Diagnosis present

## 2021-04-19 DIAGNOSIS — I69193 Ataxia following nontraumatic intracerebral hemorrhage: Secondary | ICD-10-CM | POA: Insufficient documentation

## 2021-04-19 NOTE — Progress Notes (Signed)
Subjective:    Patient ID: Walter [name], male    DOB: 10/01/1955, 65 y.o.   MRN: [id number] 65 y.o. right-handed male with history of atrial fibrillation with ablation not on anticoagulation.  Lives with spouse independent prior to admission working as a Veterinary surgeon.  Presented 09/17/2020 with right side weakness headache and dysarthria as well as vomiting.  Cranial CT/MRI showed acute intraparenchymal hemorrhage in the dorsal left pons with subarachnoid extension into the cerebral aqueduct and fourth ventricle.  CT angiogram head and neck no vascular malformation anomaly or aneurysm.  Echocardiogram with ejection fraction of 55 to 60% grade 1 diastolic dysfunction no regional wall motion abnormalities.  Admission chemistries unremarkable except glucose 169 alcohol negative urine drug screen negative.  Patient did require emergent intubation for airway protection.  Hospital course complicated by hydrocephalus with diagnostic cerebral angiogram completed showing no evidence of aneurysm.  EVD was placed per neurosurgery.  Maintained on 3% hypertonic saline.  Patient extubated 09/28/2020.  Follow-up cardiology services for atrial fibrillation maintained on Cardizem.  Patient was cleared to begin Lovenox for DVT prophylaxis 09/28/2020.  Repeat head CT scan 09/29/2020 showed new intraventricular hemorrhage into the third and lateral ventricles.  Fourth ventricular and midbrain hemorrhage unchanged and again repeated 10/24/2020 showing improved hemorrhage and edema along the shunt tract in the right frontal lobe.  No new area of hemorrhage.  Patient with long-term ventilatory support underwent tracheostomy 10/04/2020 per Dr. Merrily Pew critical care services.  On 10/08/2020 patient found to have bilateral lower extremity DVTs underwent placement of IVC filter per interventional radiology.  Dopplers again completed 10/25/2020 showing acute DVT right common femoral vein and SF junction and femoral vein proximal right profunda vein  right popliteal vein and right posterior tibial vein right peroneal vein right soleal vein and right gastrocnemius vein it was established the patient had an IVC filter maintained on Eliquis which was also initiated.  Patient with fever of unknown origin infectious disease consulted broad-spectrum antibiotics initiated.  SARS coronavirus negative.  CT chest abdomen pelvis did show bilateral large volume right greater than left pulmonary emboli no evidence of right heart strain and was discussed to continue IVC filter as well as Eliquis.  Interventional radiology consulted to discuss possible removal of IVC filter with CT venogram 10/27/2020 showing thrombus involving the IVC and iliac veins.  Large amount of thrombus involving the infrarenal IVC and IVC filter.  Suprarenal IVC was patent.  Thrombus in the right iliac veins and thrombus in the proximal femoral veins bilaterally and plan was to undergo thrombectomy and retrieval of IVC filter 10/27/2020 per Dr. Deanne Coffer of interventional radiology.  Patient initially n.p.o. with alternative means of nutritional support diet slowly advance.    Admit date: 10/28/2020 Discharge date: 11/23/2020 HPI Patient continues to go through outpatient PT OT and speech.  The patient has been in the pool and has been able to swim 6 laps.  He continues to require rolling walker for ambulation.  He continues to exhibit impulsivity. OT is working on problem solving alternating reaching and grasping.  Scanning, he is now able to do simple snack preparation at home.  He has improved his right hand grip strength patient is also working on left hand ataxia. Speech therapy is working on dysarthria, improving vocal intensity Interval history now is wearing a cam walker boot after sustaining Right achilles partial tear  Having paresthesias evaluated by neurology and started on topiramate. Nausea and vomiting with topamax. Pain Inventory Average Pain 0 Pain Right  Now 0 My pain is  NO  PAIN  LOCATION OF PAIN  NO PAIN  BOWEL Number of stools per week: 7 or more Oral laxative use Yes  Type of laxative Miralax  BLADDER Normal   Mobility walk with assistance use a walker how many minutes can you walk? 30 minutes ability to climb steps?  yes do you drive?  no Do you have any goals in this area?  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  no  Physicians involved in your care Any changes since last visit?  no   Family History  Problem Relation Age of Onset   Heart disease Mother    Heart disease Father    Healthy Sister    Healthy Brother    Social History   Socioeconomic History   Marital status: Married    Spouse name: cathy   Number of children: Not on file   Years of education: Not on file   Highest education level: Not on file  Occupational History   Not on file  Tobacco Use   Smoking status: Never   Smokeless tobacco: Never  Vaping Use   Vaping Use: Never used  Substance and Sexual Activity   Alcohol use: Yes    Comment: occasionally   Drug use: No   Sexual activity: Not on file  Other Topics Concern   Not on file  Social History Narrative   Lives with wife at home   Right Handed   Drinks decaf   Social Determinants of Health   Financial Resource Strain: Not on file  Food Insecurity: Not on file  Transportation Needs: Not on file  Physical Activity: Not on file  Stress: Not on file  Social Connections: Not on file   Past Surgical History:  Procedure Laterality Date   A-FLUTTER ABLATION N/A 03/09/2020   Procedure: A-FLUTTER ABLATION;  Surgeon: Hillis Range, MD;  Location: [facility] INVASIVE CV LAB;  Service: Cardiovascular;  Laterality: N/A;   CARDIOVERSION N/A 09/27/2013   Procedure: CARDIOVERSION;  Surgeon: Lewayne Bunting, MD;  Location: [facility] ENDOSCOPY;  Service: Cardiovascular;  Laterality: N/A;   IR  ANGIO EXTERNAL CAROTID SEL EXT CAROTID UNI R MOD SED  09/26/2020   IR ANGIO INTRA EXTRACRAN SEL COM CAROTID INNOMINATE BILAT MOD SED  09/26/2020   IR ANGIO VERTEBRAL SEL VERTEBRAL UNI R MOD SED  09/26/2020   IR IVC FILTER PLMT / S&I /IMG GUID/MOD SED  10/08/2020   IR IVUS EACH ADDITIONAL NON CORONARY VESSEL  11/02/2020   IR PTA VENOUS EXCEPT DIALYSIS CIRCUIT  11/02/2020   IR RADIOLOGIST EVAL & MGMT  01/17/2021   IR THROMBECT VENO MECH MOD SED  10/27/2020   IR THROMBECT VENO MECH MOD SED  11/02/2020   IR US GUIDE VASC ACCESS LEFT  10/27/2020   IR US GUIDE VASC ACCESS RIGHT  09/26/2020   IR US GUIDE VASC ACCESS RIGHT  11/02/2020   IR VENO/EXT/UNI RIGHT  11/02/2020   IR VENOCAVAGRAM IVC  10/27/2020   RETINAL DETACHMENT SURGERY     right knee arthroscopy     TEE WITHOUT CARDIOVERSION N/A 09/27/2013   Procedure: TRANSESOPHAGEAL ECHOCARDIOGRAM (TEE);  Surgeon: Lewayne Bunting, MD;  Location: [facility] ENDOSCOPY;  Service: Cardiovascular;  Laterality: N/A;   WRIST SURGERY     Past Medical History:  Diagnosis Date   Atrial flutter (HCC)  s/p ablation   DVT (deep venous thrombosis) ([facility])    Near syncope 09/25/2013   Paroxysmal atrial fibrillation (H[facility])    Stroke [facility])    initially hemorragic (not on OAC) followed by subsequent embolic stroke   BP 133/81   Pulse 73   Ht 6\' 3"  (1.905 m)   Wt 200 lb (90.7 kg)   SpO2 97%   BMI 25.00 kg/m   Opioid Risk Score:   Fall Risk Score:  `1  Depression screen PHQ 2/9  Depression screen [facility] 2/9 01/09/2021 11/30/2020  Decreased Interest 1 1  Down, Depressed, Hopeless 1 1  PHQ - 2 Score 2 2  Altered sleeping - 3  Tired, decreased energy - 3  Change in appetite - 0  Feeling bad or failure about yourself  - 1  Trouble concentrating - 3  Moving slowly or fidgety/restless - 3  Suicidal thoughts - 1  PHQ-9 Score - 16  Difficult doing work/chores - Somewhat difficult    Review of Systems  HENT:  Positive for drooling.   Eyes:  Positive for visual disturbance.   Musculoskeletal:  Positive for gait problem.  All other systems reviewed and are negative.     Objective:   Physical Exam Vitals and nursing note reviewed.  Constitutional:      Appearance: Normal appearance.  HENT:     Head: Normocephalic and atraumatic.  Eyes:     Extraocular Movements: Extraocular movements intact.     Conjunctiva/sclera: Conjunctivae normal.     Pupils: Pupils are equal, round, and reactive to light.  Musculoskeletal:     Comments: No pain with upper extremity lower extremity range of motion right lower extremity is in cam walker boot this was not removed for the examination.   Neurological:     Mental Status: He is alert and oriented to person, place, and time.     Comments: Skew deviation OS Diplopia improves with right eye closure. Motor strength right upper extremity 4+ in the deltoid bicep tricep grip hip flexor knee extensor ankle dorsiflexor 5/5 strength in left deltoid by stress of grip hip flexion extensor ankle dorsiflexion There is moderate dysmetria left finger-nose-finger left heel shin. Ambulates with a Rollator he requires standby assistance.  He turned suddenly with loss of balance.  Psychiatric:        Mood and Affect: Mood normal.        Behavior: Behavior normal.          Assessment & Plan:   #1.  Left dorsal pontine infarct, left cranial nerve IV palsy Left hemiataxia as well as mild right hemiparesis.  Patient does have cognitive deficits as well. We discussed usual timeframe of improvement following CVA and discussed the slower improvement seen with cognitive deficits as compared with physical. Patient and family are exploring other rehabilitation options including the [facility].  We also discussed that he may check into the [place] ability lab. Physical medicine rehab follow-up in 3 months.  Answered questions from patient and his daughter.

## 2021-04-19 NOTE — Patient Instructions (Signed)
[employer] in [place] [facility] [employer] [place] affiliated with [employer]

## 2021-04-20 ENCOUNTER — Ambulatory Visit: Payer: Medicare Other

## 2021-04-20 ENCOUNTER — Ambulatory Visit: Payer: Medicare Other | Admitting: Occupational Therapy

## 2021-04-20 DIAGNOSIS — R41841 Cognitive communication deficit: Secondary | ICD-10-CM | POA: Diagnosis not present

## 2021-04-20 DIAGNOSIS — R471 Dysarthria and anarthria: Secondary | ICD-10-CM

## 2021-04-20 NOTE — Patient Instructions (Signed)
Complete homework and return next session.

## 2021-04-23 NOTE — Therapy (Signed)
[facility] [address] [place], [place], [address] Phone: [phone]   Fax:  [phone]  Speech Language Pathology Treatment/Renewal Summary  Patient Details  Name: Walter [name] MRN: [id number] Date of Birth: 1955-12-20 Referring Provider (SLP): Charlett Blake, MD   Encounter Date: 04/20/2021   End of Session - 04/23/21 0853     Visit Number 32    Number of Visits 40    Date for SLP Re-Evaluation 05/18/21    Authorization Type Medicare    SLP Start Time 1320    SLP Stop Time  1400    SLP Time Calculation (min) 40 min    Activity Tolerance Patient tolerated treatment well             Past Medical History:  Diagnosis Date   Atrial flutter ([place])    s/p ablation   DVT (deep venous thrombosis) ([place])    Near syncope 09/25/2013   Paroxysmal atrial fibrillation ([place])    Stroke [facility])    initially hemorragic (not on [facility]) followed by subsequent embolic stroke    Past Surgical History:  Procedure Laterality Date   A-FLUTTER ABLATION N/A 03/09/2020   Procedure: A-FLUTTER ABLATION;  Surgeon: Thompson Grayer, MD;  Location: [place] [facility];  Service: Cardiovascular;  Laterality: N/A;   CARDIOVERSION N/A 09/27/2013   Procedure: CARDIOVERSION;  Surgeon: Lelon Perla, MD;  Location: [facility] ENDOSCOPY;  Service: Cardiovascular;  Laterality: N/A;   IR ANGIO EXTERNAL CAROTID SEL EXT CAROTID UNI R MOD SED  09/26/2020   IR ANGIO INTRA EXTRACRAN SEL COM CAROTID INNOMINATE BILAT MOD SED  09/26/2020   IR ANGIO VERTEBRAL SEL VERTEBRAL UNI R MOD SED  09/26/2020   IR IVC FILTER PLMT / S&I /IMG GUID/MOD SED  10/08/2020   IR IVUS EACH ADDITIONAL NON CORONARY VESSEL  11/02/2020   IR PTA VENOUS EXCEPT DIALYSIS CIRCUIT  11/02/2020   IR RADIOLOGIST EVAL & MGMT  01/17/2021   IR THROMBECT VENO MECH MOD SED  10/27/2020   IR THROMBECT VENO MECH MOD SED  11/02/2020   IR US GUIDE VASC ACCESS LEFT  10/27/2020   IR US GUIDE VASC ACCESS RIGHT   09/26/2020   IR US GUIDE VASC ACCESS RIGHT  11/02/2020   IR VENO/EXT/UNI RIGHT  11/02/2020   IR VENOCAVAGRAM IVC  10/27/2020   RETINAL DETACHMENT SURGERY     right knee arthroscopy     TEE WITHOUT CARDIOVERSION N/A 09/27/2013   Procedure: TRANSESOPHAGEAL ECHOCARDIOGRAM (TEE);  Surgeon: Lelon Perla, MD;  Location: [facility] ENDOSCOPY;  Service: Cardiovascular;  Laterality: N/A;   WRIST SURGERY      There were no vitals filed for this visit.   Subjective Assessment - 04/23/21 0847     Subjective "You're back."    Currently in Pain? No/denies                   ADULT SLP TREATMENT - 04/23/21 0001       General Information   Behavior/Cognition Alert;Cooperative;Pleasant mood;Requires cueing;Impulsive      Treatment Provided   Treatment provided Cognitive-Linquistic      Cognitive-Linquistic Treatment   Treatment focused on Cognition;Patient/family/caregiver education    Skilled Treatment Walter [name] brought in bank hours handout unfinished so SLP targeted selected attention, problem solving and error awareness in mild to moderately complex cognitive linguistic tasks,figuring taxi fees and completing mental math. Walter [name] required usual min to mod A to ID 3 errors. He was asked to double check all  his answers and req'd assistance to do this at times during the task. Most of his assistance was needed to attend to details - difficulty with alternating attention made this more challenging. As in previous appointmetn, he spontaneously used written math cues when problem was more complex. The last 10 minutes of the session Walter [name] was noted to require more frequnet SLP cues and he agreed he was suffering from mental fatigue. SLP educated pt on benefit of taking breaks during the day and he agreed.      Assessment / Recommendations / Plan   Plan Continue with current plan of care      Progression Toward Goals   Progression toward goals Progressing toward goals              SLP Education -  04/23/21 0847     Education Details take breaks when you are working    [name] Educated Patient;Caregiver(s)    Methods Explanation    Comprehension Verbalized understanding;Need further instruction              SLP Short Term Goals - 04/17/21 1709       SLP SHORT TERM GOAL #1   Title Pt will complete dysarthria HEP with occasional min A over 2 sessions    Status Not Met      SLP SHORT TERM GOAL #2   Title Pt will produce loud /a/ or "hey!" with at least high 80s dB average over 2 sessions    Status Not Met      SLP SHORT TERM GOAL #3   Title Pt will generate abdominal breathing in 16/20 answers to SLP questions over 2 sessions    Baseline 01-18-21    Status Partially Met      SLP SHORT TERM GOAL #4   Title Pt will demonstrate dysarthria compensations in 10 minute simple to mod complex conversation to be 75% intelligibile with occasional min A over 2 sessions    Status Not Met      SLP SHORT TERM GOAL #5   Title Pt will complete formal cognitive linguistic assessment in first 1-2 ST sessions    Status Achieved      SLP SHORT TERM GOAL #6   Title Pt will use memory and attention compensations for appointments, medicine management, and other daily activities with occasional min A over 2 sessions    Status Not Met              SLP Long Term Goals - 04/23/21 0848       SLP LONG TERM GOAL #1   Title Pt will complete dysarthria HEP with rare min A over 2 sessions    Baseline 03-20-21, 03-22-21    Status Achieved      SLP LONG TERM GOAL #2   Title Pt will demonstrate dysarthria compensations in 10 minute simple conversation to be 90% intelligibile with rare min A over 3 sessions    Baseline 03-22-21; 03-29-21, 04-03-21    Status Achieved      SLP LONG TERM GOAL #3   Title Pt will demo improved sustained and selective attention during therapeutic tasks with less than 3 cues for active listening during 2 sessions    Baseline 04-05-21    Status Achieved            SLP LONG TERM GOAL #4   Title Pt will demonstrate anticipatory awareness of impulsivity with min cues to modify behaviors during 2 sessions    Baseline 04-11-21  Time 1    Period Weeks    Status On-going   and ongoing   Target Date 05/18/21      SLP LONG TERM GOAL #5   Title Pt will report reduced frustration and improved communication effectiveness via PROM by 2 points at last ST sessions    Baseline CES: 15.5 & V-RQOL:35    Time 7    Period Weeks    Status On-going    Target Date 05/18/21      SLP LONG TERM GOAL #6   Title Pt will selectively attend to cognitive linguistic task for 5 minutes with 3 or less cues for redirection over 3 sessions    Baseline 04-03-21, 04-11-21    Status Achieved              Plan - 04/23/21 5501     Clinical Impression Statement "Walter [name]" presents for OPST intervention secondary to stroke in March 2022.  Pt agrees to cont cognition. SLP targeted recall and attention related to functional tasks, in which occasional cues required to increase attention to detail and error awareness. Good recall and reasoning exhibited this session. See daily note for details. Today, Walter [name] suffered from mental fatigue after working for approx 25-30 minutes, and SLP educated pt in the benefit in taking breaks during the day. Walter [name] cont to benefit from skilled ST to address cognition to maximize communication effectiveness and optimize functional independence and safety at home.    Duration --   16 weeks   Treatment/Interventions Cueing hierarchy;Functional tasks;Patient/family education;Cognitive reorganization;Multimodal communcation approach;Language facilitation;Compensatory techniques;Internal/external aids;SLP instruction and feedback    Potential to Achieve Goals Good    Potential Considerations Severity of impairments    Consulted and Agree with Plan of Care Patient             Patient will benefit from skilled therapeutic intervention in order to improve  the following deficits and impairments:   Cognitive communication deficit  Dysarthria and anarthria    Problem List Patient Active Problem List   Diagnosis Date Noted   Residual cognitive deficit as late effect of stroke 04/19/2021   Hemiparesis affecting right side as late effect of stroke ([place]) 04/19/2021   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 04/19/2021   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 ([place]) 04/19/2021   Thrombosis    Sleep disturbance    Dysphagia, post-stroke    PAF (paroxysmal atrial fibrillation) ([place])    Acute pulmonary embolism without acute cor pulmonale (HCC)    Malnutrition of moderate degree 11/04/2020   Tracheostomy care ([place])    Pressure injury of skin 10/23/2020   Intracranial hemorrhage (H[facility])    Atrial fibrillation ([place])    Prediabetes    Leukocytosis    Acute blood loss anemia    Hypernatremia    ICH (intracerebral hemorrhage) ([place]) 09/25/2020   Unilateral primary osteoarthritis, left knee 09/26/2016   Atrial flutter ([place]) 09/25/2013   Near syncope 09/25/2013    [facility] ,MS, CCC-SLP  04/23/2021, 8:54 AM  [facility] [address] [place], [place], [address] Phone: [phone]   Fax:  [phone]   Name: Walter [name] MRN: [id number] Date of Birth: 1955/09/25

## 2021-04-24 ENCOUNTER — Ambulatory Visit: Payer: Medicare Other

## 2021-04-24 ENCOUNTER — Ambulatory Visit: Payer: Medicare Other | Admitting: Occupational Therapy

## 2021-04-24 ENCOUNTER — Other Ambulatory Visit: Payer: Self-pay

## 2021-04-24 DIAGNOSIS — R41842 Visuospatial deficit: Secondary | ICD-10-CM

## 2021-04-24 DIAGNOSIS — M6281 Muscle weakness (generalized): Secondary | ICD-10-CM

## 2021-04-24 DIAGNOSIS — R41844 Frontal lobe and executive function deficit: Secondary | ICD-10-CM

## 2021-04-24 DIAGNOSIS — R270 Ataxia, unspecified: Secondary | ICD-10-CM

## 2021-04-24 DIAGNOSIS — R2681 Unsteadiness on feet: Secondary | ICD-10-CM

## 2021-04-24 DIAGNOSIS — R293 Abnormal posture: Secondary | ICD-10-CM

## 2021-04-24 DIAGNOSIS — R41841 Cognitive communication deficit: Secondary | ICD-10-CM | POA: Diagnosis not present

## 2021-04-24 DIAGNOSIS — R278 Other lack of coordination: Secondary | ICD-10-CM

## 2021-04-24 NOTE — Therapy (Signed)
OUTPATIENT PHYSICAL THERAPY TREATMENT NOTE/PROGRESS NOTE Patient Name: Walter [name] MRN: [id number] DOB:March 11, 1956, 65 y.o., male Today's Date: 04/24/2021  PCP: Walter [name], Walter [name] REFERRING PROVIDER: Alroy [name], Walter [name]        PT End of Session - 04/24/21 1240     Visit Number 35    Number of Visits 44    Date for PT Re-Evaluation 05/04/21    Authorization Type Medicare 12/26/20 to 02/20/21; Recert 1/74/08 to 14/48/18; 56D PN/recert on 07/05/95 to 08/07/35    Authorization Time Period 03/09/21-05/04/21    Progress Note Due on Visit 59    PT Start Time 1235    PT Stop Time 1315    PT Time Calculation (min) 40 min    Equipment Utilized During Treatment Gait belt    Activity Tolerance Patient tolerated treatment well    Behavior During Therapy WFL for tasks assessed/performed                    Past Medical History:  Diagnosis Date   Atrial flutter ([place])    s/p ablation   DVT (deep venous thrombosis) (HCC)    Near syncope 09/25/2013   Paroxysmal atrial fibrillation (HCC)    Stroke [facility])    initially hemorragic (not on [place]) followed by subsequent embolic stroke   Past Surgical History:  Procedure Laterality Date   A-FLUTTER ABLATION N/A 03/09/2020   Procedure: A-FLUTTER ABLATION;  Surgeon: Walter Grayer, [name];  Location: [facility] LAB;  Service: Cardiovascular;  Laterality: N/A;   CARDIOVERSION N/A 09/27/2013   Procedure: CARDIOVERSION;  Surgeon: Walter Perla, [name];  Location: [facility] ENDOSCOPY;  Service: Cardiovascular;  Laterality: N/A;   IR ANGIO EXTERNAL CAROTID SEL EXT CAROTID UNI R MOD SED  09/26/2020   IR ANGIO INTRA EXTRACRAN SEL COM CAROTID INNOMINATE BILAT MOD SED  09/26/2020   IR ANGIO VERTEBRAL SEL VERTEBRAL UNI R MOD SED  09/26/2020   IR IVC FILTER PLMT / S&I /IMG GUID/MOD SED  10/08/2020   IR IVUS EACH ADDITIONAL NON CORONARY VESSEL  11/02/2020   IR PTA VENOUS EXCEPT DIALYSIS CIRCUIT  11/02/2020   IR RADIOLOGIST EVAL & MGMT  01/17/2021   IR THROMBECT  VENO MECH MOD SED  10/27/2020   IR THROMBECT VENO MECH MOD SED  11/02/2020   IR US GUIDE VASC ACCESS LEFT  10/27/2020   IR US GUIDE VASC ACCESS RIGHT  09/26/2020   IR US GUIDE VASC ACCESS RIGHT  11/02/2020   IR VENO/EXT/UNI RIGHT  11/02/2020   IR VENOCAVAGRAM IVC  10/27/2020   RETINAL DETACHMENT SURGERY     right knee arthroscopy     TEE WITHOUT CARDIOVERSION N/A 09/27/2013   Procedure: TRANSESOPHAGEAL ECHOCARDIOGRAM (TEE);  Surgeon: Walter Perla, [name];  Location: [facility] ENDOSCOPY;  Service: Cardiovascular;  Laterality: N/A;   WRIST SURGERY     Patient Active Problem List   Diagnosis Date Noted   Residual cognitive deficit as late effect of stroke 04/19/2021   Hemiparesis affecting right side as late effect of stroke ([place]) 04/19/2021   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 04/19/2021   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 ([place]) 04/19/2021   Thrombosis    Sleep disturbance    Dysphagia, post-stroke    PAF (paroxysmal atrial fibrillation) ([place])    Acute pulmonary embolism without acute cor pulmonale (HCC)    Malnutrition of moderate degree 11/04/2020   Tracheostomy care ([place])    Pressure injury of skin 10/23/2020   Intracranial hemorrhage ([place])  Atrial fibrillation (HCC)    Prediabetes    Leukocytosis    Acute blood loss anemia    Hypernatremia    ICH (intracerebral hemorrhage) ([place]) 09/25/2020   Unilateral primary osteoarthritis, left knee 09/26/2016   Atrial flutter ([place]) 09/25/2013   Near syncope 09/25/2013  See Note below for Objective Data and Assessment of Progress/Goals.  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   THERAPY DIAG:  Ataxia  Muscle weakness (generalized)  Unsteadiness on feet  PERTINENT HISTORY: atrial flutter s/p ablation, DVT, near syncope, CVA  PRECAUTIONS: fall  SUBJECTIVE: Sustained a fall in his  kitchen on 10/23 when trying to move about, denies injury but spouse reports he injured his neck  TODAY'S TREATMENT:   04/24/21  Ambulation of 160f with 6# on LLE and 3# on cane, Min/ModA needed at times due to ataxia and possible lethargy from new meds, several LOB episodes noted requiring PT assist to prevent falls  Seated core exercises of hip/shoulder tosses, chops and Vs with 6# ball 10x each task  Sit to stand using green t-band for resisted OH reach once standing  Seated forward reaching to floor to retrieve 4KG ball and reach OThorpwith it, 10x        04/13/21 Pt seen for aquatic therapy today.  Treatment took place in water 3.25-4.[facility] Temp of water was 92.Pt entered/exited the pool via stairs step to pattern independently with bilat rail.                           Amb progressed to  with and without hand buoys forward, backward (posterior chain activation) and side stepping/walking cues for increased step length, posture and weight shifting x 4 lengths of pool. Pt requiring min assist with backward amb.   Standing -hip add/abd and extension x 15 reps holding to pool wall. VC for increases speed to increase resistance.   -hip extension 2 x 10 -step ups 70% submerged x15 R/L -step over (step) vc for placement and control of le/core x 10  Gait training 50% submerged cues for direct path, control of le/placement of feet.     Supine suspension Standing<>supine using 2 foam hand buoys x5 reps holding position x 10 second each. Vc and demo with min TC to gain position -in above position knees to chest with assist from therapist to maintain position 3 x 10 core strengthening   Aerobic capacity Swimming crawl and breast stroke 6 lengths of pool sba continuously.  VC and TC for straight path   Pt requires buoyancy for support and to offload joints with strengthening exercises. Viscosity of the water is needed for resistance of strengthening;  water current perturbations provides challenge to standing balance unsupported, requiring increased core activation.     04/11/21  Seated on blue disk, forward reaching to floor wih 4KG ball to lap and repeated w/o ball to vary resistance levels and have patient adapt  Searwd on blue disk, lifting 4KG ball OH and repeating w/o ball to challenge core and adapt to varied resistance challenges   Seated LAQs on disk, 6# wts on L ankle, boot on R  with lat press alternating pattern, 1x15  Seated marching on disk, 6# wts on L ankle, boot on R, with lat press alternating pattern 1x15  15x STS performing OH press against red band  once standing, CGA needed for stability  179f ambulation with 3# weighted cane, 6# on L ankle, requiring Min/ModA to prevent fall  1150fambulaton with 6# weighted L ankle and RW with Min/ModA needed to stabilize   04/10/21  PRacticed bathroom transfer: ambulated 100 feet with RW to bathroom with SBA for trasnfers and walking. Pt needed verbal cues to make sure he has walker in front of him when he is getting off the toilet so he doesn't have to reach for it on his side. Biodex harness suppoted treadmill walking: harness for safety only, 100% Body weight - 1.26m84mfor 3' with bil HHA, cues for controlled heel strike, "make your steps quieter" Rest - 1.4mp47mor 3' with 5% incline and then 1.8mph426mr 3' with 0% incline: total 6', bil HHA        04/06/21 Pt seen for aquatic therapy today.  Treatment took place in water 3.25-4.8 ft in [facility]. Temp of water was 92.Pt entered/exited the pool via stairs step to pattern independently with bilat rail.                           Amb progressed to hand buoys forward, backward (posterior chain activation) and side stepping/walking cues for increased step length, posture and weight shifting x 4 lengths of pool. Pt requiring min assist with backward amb.   Standing -hip add/abd and extension x 15 reps  holding to pool wall. VC for increases speed to increase resistance.  Attempted completion with support of ue buoys but pt unable to maintain standing posture -hip extension 2 x 10 -water walking 4 widths holding 2 foam buoys at hips/submerged    Supine suspension Standing<>supine using 2 foam hand buoys x7 reps holding position x 10 second each. Vc and demo with min TC to gain position -in above position knees to chest with assist from therapist to maintain position 3 x 10 core strengthening   Aerobic capacity Using yellow noodle flutter kicking and frog kicking 6 lengths, pt able to maintain position indep "Swimming" 4 lengths breast and crawl stroke with cga for maintaining straight path.   Pt requires buoyancy for support and to offload joints with strengthening exercises. Viscosity of the water is needed for resistance of strengthening; water current perturbations provides challenge to standing balance unsupported, requiring increased core activation.    04/03/21  Seated on green disk, forward reaching to floor wih 2.5KG ball followed by OH reThe [facility] In Anadarkohing ea. For 20 reps, single movement Seated trunk flexion/extension over sitting disk, extension blocked by bolster, 15x with 2.5KG ball  Seated B rotation holding 2.5KG ball 20x, hip to hip  Seated LAQs on disk, 4# wts, with lat press alternating pattern, 1x20  Seated marching on disk, 4# wts with lat press alternating pattern 1x20  Supine SLR circled CW/CCW against red band 2 min duration ea. LE  5x STS in 23.5s   03/29/21  Seated on green disk, forward reaching to floor wih 2KG ball followed by OH reFore[facility]ucks Countyhing ea. For 20 reps, single movement Seated trunk flexion/extension over sitting disk, extension blocked by bolster, 15x.  Seated B rotation holding 2KG ball 20x, hip to hip  Seated LAQs on disk, 3# wts, with lat press alternating pattern, 2x15  Seated marching on disk, 3# wts with lat press alternating pattern  Supine SLR circled CW/CCW  against red band 1 min duration ea. LE  Scifit L4 8'  03/27/21  Seated on green disk, forward reaching to floor wih 3KG ball followed by [facility] reaching ea. For 15 reps Seated trunk flexion/extension over sitting disk, extension blocked by bolster, 15x.  Seated B rotation holding 3KG ball 15x, hip to hip  Ambulation with QC 59f, SPC 357fthen SPChatham Orthopaedic Surgery Asc LLCith 3# wt [id number] Min/ModA for balance corrections  From green disk, STS 15x with 3KG ball   Seated on green disk, OH press with 3KG ball      03/22/21  Completed gait training x 230 ft with RW, PT facilitating at pelvis for improved weight shift and cues for controlled stepping to promote stability. Then progressed to gait training with negotiation around obstacles (cones) x 3 laps, cues to stay aligned within walker, most challenge noted with sharp turns. CGA throughout. Continued cues for improved turn and alignment with surface prior to descent with sit <> stand.   Seated on green air disc without UE support, completed lateral weight shift to R/L x 15 reps working on control and cues for posture/core activation. Then progressed to weight shift with reaching for cone in various directions x 10 reps.   Tall Kneeling on Mat with BUE support on Bench positioned in front of patient. CGA to get into/out of position. In tall kneeling completed hip hinges 2 x 10 reps working on improved return to upright and maintian hip extension, cues to keep posture upright. Then completed alternating UE raises x 10 reps bilaterally. Lateral reaching to PT's hand x 5 reps bilateral directions. Then trialed no UE support, difficulty maintaining tall kneeling position without assistance.    03/20/21 Seated trunk flexion/extension holding 5# ball 15X  Seated SB holding 5# ball 15x with PT stabilization dropping to each elbow  STS 15x with 5# ball SLR stabilization against blue t-band 60s duration x2 ea. LE DKTC over red physioball 15x   LTR 15x over red  physioball  Scifit seat 20 arms 7 L2 x8mi72m 03/14/21  Seated marching, alt, 6# 15x per LE with latissimus press while seated on green disk to challenge seated balance  Seated LAQs 6# alt. 15x per LE with latissimus press while seated on green disk to challenge seated balance   STS with OH punch using yellow t-band 10x  SLR circles against red band, 60s each leg in supine position  Ambulation of 200ft66fside of clinic with RW with CGA to accommodate facility fire drill  Supine/sit transitions 5x to ea. Side with random stopping points  Quadriped on elbows WS forward and back 10x  03/12/21 Ambulation of 460ft 46f RW and CGA, cued to pause walking before pivot turning to focus on task at hand and break down tasks into individual  components to control ataxia Seated on green disk, alternating marching and LAQs with lat press to activate core, 3# ankle weights, alternating pattern, 15 reps per LE Seated on disk, forward reaching using RW 10x              Seated core using disk and 3.3# ball using hip/shoulder tosses, chops and Vs for 10 reps per task  STS with OH reach once standing, 10 reps 03/09/21 Gait training: standing up from standard chair with large based quad cane walking up around cone (10 feet away) and sitting back down in chair: 2x, cues for 3step pattern and sequencing, min A to mod A required 1 x 230 feet with RW, CGA to min A with cues for smaller steps BBS performed 5x sit  to stand performed.   03/01/2021 Pt seen for aquatic therapy today.  Treatment took place in water 3.25-4.8 ft in depth at the [employer] pool. Temp of water was 92. entered/exited the pool via stairs step to pattern independently with bilat rail.  Introduced pt to aquatic setting.                           Amb using water walker. forward, backward and side stepping/walking cues for increased step length, posture and weight shifting x 12 minutes. Pt requiring mod to min asst. Multiple  LOB.  Seated -sit to stand transfers from water bench submerged to waist when standing.  Cueing for weight shift/execution. Initally completed with water walker, progressed to thick yellow noodle.  Pt requiring min-cga for completion. 2 x 5 Marching x 10 LAQ x 10.  Standing -step ups x leading with L/R LE.  Cueing for decreased impulsiveness /improved concentration on finishing first movement before moving to next. -toe raises 2 x 10 Cueing for balance/slowing movements  Supine suspension Flutter kicking x 5 min supported by Pt  Pt requires buoyancy for support and to offload joints with strengthening exercises. Viscosity of the water is needed for resistance of strengthening; water current perturbations provides challenge to standing balance unsupported, requiring increased core activation.   02/28/21: Gait training: 1 x 460 feet with tactile cues at pelvis to guide weight shifts cues for slower cadence and turning slowly Standing on floor: OH reaching and fwd reaching: alternating between reaches: 10x each Standing touching toes: 5x Gait training: chair to chair transfer with 180 deg turn: quad cane, CGA to min A: 4x  02/26/21: Gait training: 1 x 420 feet with tactile cues at pelvis to guide weight shifts cues for slower cadence and turning slowly Sit to stand: 10x no HHA Standing at countertop with wide BOS: moving basket with 20lbs from side to side to increase core engagement and weight shifts Picking up basket with 20lb from counter to chest: 5x Working on ankle strategy: standing heel and toe raises without HHA: very close guarding: 2 x 10  Standing contralateral reaching: 10x R and L    PATIENT EDUCATION: Education details: Scheduling more PT visits for October Person educated: Patient and caregiver Education method: Consulting civil engineer, Media planner, and Verbal cues Education comprehension: verbalized understanding     HOME EXERCISE PROGRAM: House ambulation with Cgs in walking  boot   ASSESSMENT:   CLINICAL IMPRESSION:  Patient showing some lethargic type behavior following addition of topamax to his med regimen.  Todays session focused on gait training with weighted cane and LLE.  Cadence was slower than usual with several LOB episodes requiring PT intervention to prevent falling.  Continued core strengthening w/o sitting disk to assess overall dynamic sitting balance.  OPPT visits extended to meet goals.    REHAB POTENTIAL: Good   CLINICAL DECISION MAKING: Stable/uncomplicated  GOALS: Goals reviewed with patient? Yes   SHORT TERM GOALS:   STG Name Target Date Goal status  1 Patient will be able to perform chair to chair transfer with CGA/SBA with use of RW for safety to improve independence. Baseline: min to mod A (12/26/20); CGA/minA (03/09/21) 01/23/2021 03/27/21 Able to transfer STS and stand pivot with SBA, goal met  2 Pt will be able to ambulate 115' with RW with CGA to min A with RW for safety to improve independence Baseline: min to mod A with RW 145'; 230 feet with RW  and CGA to MIN A 01/23/2021 03/09/21 Goal met  3 Pt will be able to maintain standing balance for 30 sec to improve static balance Baseline: 10 sec before needing help due to LOB (12/26/20) 01/23/2021 02/05/21 able to stand with S for 30s, goal met  4. Pt will be able to perform sit to stand transfers with SBA to improve independence 04/06/21 Goal met 03/27/21  5. Pt will be able to perform 230 feet of walking with RW with SBA/CGA to improve household ambulation  04/06/21 Revised 03/09/21 04/03/21 UTA due to achilles partial tear    LONG TERM GOALS:   LTG Name Target Date Goal status  1 Pt will demo >5 points improvement on Berg Balance Scale to improve static balance Baseline: 11/56 01/03/21 05/28/2021 INITIAL 03/09/21 BBS 21/56 04/03/21 UTA due to achilles partial tear  2 Pt will be able to ambulate >500 feet with RW and SBA to improve walking endurance and safety. Baseline: 145' with RW min to mod  A; 230 feet with CGA to Min A (03/09/21)  05/28/21 04/24/21 131f with weighted cane and LLE needing CGA/MinA  3 Pt  will be able to perform gait speed improvement by 0.134m to improve community ambulation. Baseline:0.2615m(03/09/21) 05/28/21 Progressing continue 04/03/21 UTA due to achilles partial tear  4 Pt will demo 5 sec improvement in 5x sit to stand (with or without UE support) to improve functional strength Baseline: 5x sit to stand with bil UE 17 sec;  05/28/21 Progressing continue 04/03/21 Assessed at 23.5s, performance limited by R walking boot    PLAN: PT FREQUENCY: 2-3x/week   PT DURATION: 8 weeks   PLANNED INTERVENTIONS: Aquatic therapy, Therapeutic exercises, Therapeutic activity, Neuro Muscular re-education, Balance training, Gait training, Patient/Family education, Joint mobilization, Stair training, Visual/preceptual remediation/compensation, Wheelchair mobility training, Cryotherapy, Moist heat, and Manual therapy   PLAN FOR NEXT SESSION:  Continue core and balance strengthening until patient becomes more proficient with R walking boot.  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 04/19/2021   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 (HCCMaybee0/20/2022   Thrombosis    Sleep disturbance    Dysphagia, post-stroke    [place]F ([place]ox[place]mal [place] fibrillation) ([place]  Acute pulmonary embolism without acute cor pulmonale (HCC)    Malnutrition of moderate degree 11/04/2020   Trache[place] care ([place]  Pressure injury of skin 10/23/2020   Intracranial hemorrhage (HCC)    Atrial  fibrillation ([place]  Prediabetes    Leukocytosis    Acute blood loss anemia    Hypernatremia    ICH (intracerebral hemorrhage) ([place]CCGlen[place]3/28/2022   Unilateral primary osteoarthritis, left knee 09/26/2016   Atrial flutter (HCCFieldon3/28/2015   Near syncope 09/25/2013    JefLanice ShirtsPT 04/24/2021, 2:39 PM  [address]iTucsoneVillalbaC,[id number]: [phone]:  [phone]

## 2021-04-24 NOTE — Therapy (Signed)
[facility] [address] [place], [place], [address] Phone: [phone]   Fax:  [phone]  Occupational Therapy Treatment  Patient Details  Name: Walter [name] MRN: [id number] Date of Birth: January 28, 1956 Referring Provider (OT): Dr. Alysia Penna   Encounter Date: 04/24/2021   OT End of Session - 04/24/21 1410     Visit Number 31    Number of Visits 71    Date for OT Re-Evaluation 04/27/21    Authorization Type Medicare Part A & B **need to add KX**    Authorization - Visit Number 31    Authorization - Number of Visits 40    Progress Note Due on Visit 43    OT Start Time 1403    OT Stop Time 1443    OT Time Calculation (min) 40 min    Activity Tolerance Patient tolerated treatment well    Behavior During Therapy [facility] for tasks assessed/performed             Past Medical History:  Diagnosis Date   Atrial flutter ([place])    s/p ablation   DVT (deep venous thrombosis) ([place])    Near syncope 09/25/2013   Paroxysmal atrial fibrillation ([place])    Stroke ([place])    initially hemorragic (not on [place]) followed by subsequent embolic stroke    Past Surgical History:  Procedure Laterality Date   A-FLUTTER ABLATION N/A 03/09/2020   Procedure: A-FLUTTER ABLATION;  Surgeon: Thompson Grayer, MD;  Location: [facility];  Service: Cardiovascular;  Laterality: N/A;   CARDIOVERSION N/A 09/27/2013   Procedure: CARDIOVERSION;  Surgeon: Lelon Perla, MD;  Location: [facility] ENDOSCOPY;  Service: Cardiovascular;  Laterality: N/A;   IR ANGIO EXTERNAL CAROTID SEL EXT CAROTID UNI R MOD SED  09/26/2020   IR ANGIO INTRA EXTRACRAN SEL COM CAROTID INNOMINATE BILAT MOD SED  09/26/2020   IR ANGIO VERTEBRAL SEL VERTEBRAL UNI R MOD SED  09/26/2020   IR IVC FILTER PLMT / S&I /IMG GUID/MOD SED  10/08/2020   IR IVUS EACH ADDITIONAL NON CORONARY VESSEL  11/02/2020   IR PTA VENOUS EXCEPT DIALYSIS CIRCUIT  11/02/2020   IR RADIOLOGIST EVAL & MGMT   01/17/2021   IR THROMBECT VENO MECH MOD SED  10/27/2020   IR THROMBECT VENO MECH MOD SED  11/02/2020   IR US GUIDE VASC ACCESS LEFT  10/27/2020   IR US GUIDE VASC ACCESS RIGHT  09/26/2020   IR US GUIDE VASC ACCESS RIGHT  11/02/2020   IR VENO/EXT/UNI RIGHT  11/02/2020   IR VENOCAVAGRAM IVC  10/27/2020   RETINAL DETACHMENT SURGERY     right knee arthroscopy     TEE WITHOUT CARDIOVERSION N/A 09/27/2013   Procedure: TRANSESOPHAGEAL ECHOCARDIOGRAM (TEE);  Surgeon: Lelon Perla, MD;  Location: [facility] ENDOSCOPY;  Service: Cardiovascular;  Laterality: N/A;   WRIST SURGERY      There were no vitals filed for this visit.   Subjective Assessment - 04/24/21 1510     Subjective  Pt denies pain    Patient is accompanied by: --   caregiver--Kelly   Pertinent History ICH of brainstem (complicated by hydrocephalus, DVTs, and tracheostomy).  PMH:  a-fib    Limitations fall risk, ataxia, impulsivity    Patient Stated Goals use L side of body better (decrease ataxia), stay active and heal brain    Currently in Pain? No/denies               Treatment: Pt ambulated into waiting room  without supervision. Pt ambulated back to gym with min guard and min v.c/ facilitation as pt kicked walker multiple times with boot. Discussion with pt regarding recommendation for supervision for safety when ever he is up walking. Pt disagreed with therapist stating" unless someone is really strong they can't catch me". Therapist discussed that a caregiver can provide cueing or control a fall for safety. Pt was very distracted today and frustrated with overall progress. Functional reaching to stack blocks with left and right UE's simultaneously, min difficulty, improved control. Pt wrote 2 sentence with good legibility and letter size. Prone on mat: Lifting chest, and scapular retraction with shoulder extension, min v.c and facilitation. Tall kneeling on elbows over stool, lifting alternate UE, and rocking forwards and backwards,  mod facilitation for safety as pt appears less alert and more distracted today. Seated closed chain shoulder flexion with light ball for increased control, min v.c Pt ambulated back to waiting room with RW, minguard, and min A. Therapist discussed with pt's wife recommendation for supervision when pt is up walking.                     OT Short Term Goals - 04/05/21 1436       OT SHORT TERM GOAL #1   Title Pt/caregiver will be independent with initial HEP for LUE coordination and R hand strength.--    Time 4    Period Weeks    Status Achieved   needs continued reinforcement, pt is not using consistently   Target Date 03/02/21      OT SHORT TERM GOAL #2   Title Pt/caregiver will be independent with vision HEP and visual compensation strategies.    Time 4    Period Weeks    Status Achieved   Pt/ family verbalize understanding of vision HEP, needs continued reinforcement of compensatory strategeis- pt is not performing head turns     OT SHORT TERM GOAL #3   Title Pt will improve LUE coordination for ADLs as shown by improving score on box and blocks test by at least 6.    Baseline L-21 blocks    Time 4    Period Weeks    Status On-going   25 blocks.  03/12/21:  24 blocks     OT SHORT TERM GOAL #4   Title Pt will perform simple snack prep/home maintenance task from w/c level mod I.    Time 4    Period Weeks    Status Achieved   met per pt's wife     OT SHORT TERM GOAL #5   Title Pt will verbalize understanding of compensation strategies for sensory deficts for incr safety.    Time 4    Period Weeks    Status Achieved   Pt verbalized that he has to be careful about temperature, needs additional reinforcement.  03/12/21:  met     OT SHORT TERM GOAL #6   Title Pt will improve R hand grip stength to at least 32lbs to assist with ADLs/opening containers.    Time 4    Period Weeks    Status Achieved   33.5     OT SHORT TERM GOAL #7   Title --                OT Long Term Goals - 04/24/21 1412       OT LONG TERM GOAL #1   Title Pt will be independent with updated HEP--    Time  12    Period Weeks    Status On-going      OT LONG TERM GOAL #2   Title Pt will improve L hand coordination for ADLs as shown by completing 9-hole peg test in less than 70sec.    Baseline 107sec    Time 12    Period Weeks    Status On-going   03/12/21:  78sec, 78sec (with multiple drops).  04/17/21:  72.69sec     OT LONG TERM GOAL #3   Title Pt will be mod I with toileting.    Time 12    Period Weeks    Status Achieved   03/12/21:  supervision/CGA.  04/10/21:  met per pt report     OT LONG TERM GOAL #4   Title Pt will perform simple snack prep/home maintenance tasks in standing with supervision.    Time 12    Period Weeks    Status On-going      OT LONG TERM GOAL #5   Title Pt will demo good safety awareness/compensation for sensory deficits and attention to LUE during functional tasks and transfers without cueing.    Time 12    Period Weeks    Status On-going   04/17/21:  pt with difficulty/bumping into items with RW on L side at times, pt demo impulsivity at times with movement or with distractions with LUE movement (particularly with release of objects)     OT LONG TERM GOAL #6   Title Pt will perform simple environmental scanning/navigation with at least 90% accuracy for incr safety.    Time 12    Period Weeks    Status On-going   03/12/21:  70% with CGA for balance/ambulation.  04/17/21:  100% with scanning for objects, but needed close supervision/min cueing for safety/bumping into items on L side     OT LONG TERM GOAL #7   Title Pt will perform all basic ADLS with no more than supervision.    Time 12    Period Weeks    Status On-going   03/12/21:  min A-supervision.  04/17/21:  min A for boot, min A for shower transfer, supervision for tolieting                  Plan - 04/24/21 1511     Clinical Impression Statement Pt is progressing  slowly towards goals. Pt has started Topamax and he sustained a fall this weekend. Pt appears more distracted and groggy, and he is kicking walker with boot when walking.    OT Occupational Profile and History Detailed Assessment- Review of Records and additional review of physical, cognitive, psychosocial history related to current functional performance    Occupational performance deficits (Please refer to evaluation for details): ADL's;IADL's;Work;Leisure;Social Participation    Body Structure / Function / Physical Skills ADL;Strength;Balance;Proprioception;UE functional use;IADL;Endurance;Vision;Mobility;Coordination;Decreased knowledge of precautions;FMC;GMC;Sensation    Cognitive Skills Attention;Safety Awareness;Memory;Perception;Problem Solve    Rehab Potential Good    Clinical Decision Making Several treatment options, min-mod task modification necessary    Comorbidities Affecting Occupational Performance: May have comorbidities impacting occupational performance    Modification or Assistance to Complete Evaluation  Min-Moderate modification of tasks or assist with assess necessary to complete eval    OT Frequency 3x / week    OT Duration 12 weeks    OT Treatment/Interventions Self-care/ADL training;Moist Heat;Fluidtherapy;DME and/or AE instruction;Balance training;Therapeutic activities;Aquatic Therapy;Ultrasound;Therapeutic exercise;Cognitive remediation/compensation;Visual/perceptual remediation/compensation;Functional Mobility Training;Neuromuscular education;Cryotherapy;Energy conservation;Manual Therapy;Patient/family education    Plan renew in next few visits, continues transfers/safety,  environmental scanning/navigation, simple home maintenance (WBAT RLE with boot)    Consulted and Agree with Plan of Care Patient             Patient will benefit from skilled therapeutic intervention in order to improve the following deficits and impairments:   Body Structure / Function /  Physical Skills: ADL, Strength, Balance, Proprioception, UE functional use, IADL, Endurance, Vision, Mobility, Coordination, Decreased knowledge of precautions, [place], GMC, Sensation Cognitive Skills: Attention, Safety Awareness, Memory, Perception, Problem Solve     Visit Diagnosis: Ataxia  Abnormal posture  Muscle weakness (generalized)  Unsteadiness on feet  Visuospatial deficit  Other lack of coordination  Frontal lobe and executive function deficit    Problem List Patient Active Problem List   Diagnosis Date Noted   Residual cognitive deficit as late effect of stroke 04/19/2021   Hemiparesis affecting right side as late effect of stroke ([place]) 04/19/2021   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 04/19/2021   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 ([place]) 04/19/2021   Thrombosis    Sleep disturbance    Dysphagia, post-stroke    PAF (paroxysmal atrial fibrillation) ([facility])    Acute pulmonary embolism without acute cor pulmonale (H[facility])    Malnutrition of moderate degree 11/04/2020   Tracheostomy care ([place])    Pressure injury of skin 10/23/2020   Intracranial hemorrhage ([facility])    Atrial fibrillation ([facility])    Prediabetes    Leukocytosis    Acute blood loss anemia    Hypernatremia    ICH (intracerebral hemorrhage) ([place]) 09/25/2020   Unilateral primary osteoarthritis, left knee 09/26/2016   Atrial flutter ([place]) 09/25/2013   Near syncope 09/25/2013    Jeris Easterly, OT/L 04/24/2021, 3:13 PM  [facility] [address] [place], [place], [address] Phone: [phone]   Fax:  [phone]  Name: OLUWATOMISIN DEMAN MRN: [id number] Date of Birth: 07-31-1955

## 2021-04-27 ENCOUNTER — Ambulatory Visit: Payer: Medicare Other

## 2021-04-27 ENCOUNTER — Ambulatory Visit: Payer: Medicare Other | Admitting: Occupational Therapy

## 2021-04-27 ENCOUNTER — Other Ambulatory Visit: Payer: Self-pay

## 2021-04-27 DIAGNOSIS — M6281 Muscle weakness (generalized): Secondary | ICD-10-CM

## 2021-04-27 DIAGNOSIS — R270 Ataxia, unspecified: Secondary | ICD-10-CM

## 2021-04-27 DIAGNOSIS — R41841 Cognitive communication deficit: Secondary | ICD-10-CM | POA: Diagnosis not present

## 2021-04-27 DIAGNOSIS — R293 Abnormal posture: Secondary | ICD-10-CM

## 2021-04-27 DIAGNOSIS — R278 Other lack of coordination: Secondary | ICD-10-CM

## 2021-04-27 DIAGNOSIS — R41844 Frontal lobe and executive function deficit: Secondary | ICD-10-CM

## 2021-04-27 DIAGNOSIS — R41842 Visuospatial deficit: Secondary | ICD-10-CM

## 2021-04-27 DIAGNOSIS — R2681 Unsteadiness on feet: Secondary | ICD-10-CM

## 2021-04-27 NOTE — Therapy (Signed)
[facility] [address] [place], [place], [address] Phone: [phone]   Fax:  [phone]  Speech Language Pathology Treatment  Patient Details  Name: Walter [name] MRN: [id number] Date of Birth: Dec 20, 1955 Referring Provider (SLP): Charlett Blake, MD   Encounter Date: 04/27/2021   End of Session - 04/27/21 1349     Visit Number 33    Number of Visits 42    Date for SLP Re-Evaluation 05/18/21    Authorization Type Medicare    SLP Start Time 1319    SLP Stop Time  1400    SLP Time Calculation (min) 41 min    Activity Tolerance Patient tolerated treatment well             Past Medical History:  Diagnosis Date   Atrial flutter ([place])    s/p ablation   DVT (deep venous thrombosis) ([place])    Near syncope 09/25/2013   Paroxysmal atrial fibrillation ([place])    Stroke [facility])    initially hemorragic (not on [facility]) followed by subsequent embolic stroke    Past Surgical History:  Procedure Laterality Date   A-FLUTTER ABLATION N/A 03/09/2020   Procedure: A-FLUTTER ABLATION;  Surgeon: Thompson Grayer, MD;  Location: [facility] CV LAB;  Service: Cardiovascular;  Laterality: N/A;   CARDIOVERSION N/A 09/27/2013   Procedure: CARDIOVERSION;  Surgeon: Lelon Perla, MD;  Location: [facility] ENDOSCOPY;  Service: Cardiovascular;  Laterality: N/A;   IR ANGIO EXTERNAL CAROTID SEL EXT CAROTID UNI R MOD SED  09/26/2020   IR ANGIO INTRA EXTRACRAN SEL COM CAROTID INNOMINATE BILAT MOD SED  09/26/2020   IR ANGIO VERTEBRAL SEL VERTEBRAL UNI R MOD SED  09/26/2020   IR IVC FILTER PLMT / S&I /IMG GUID/MOD SED  10/08/2020   IR IVUS EACH ADDITIONAL NON CORONARY VESSEL  11/02/2020   IR PTA VENOUS EXCEPT DIALYSIS CIRCUIT  11/02/2020   IR RADIOLOGIST EVAL & MGMT  01/17/2021   IR THROMBECT VENO MECH MOD SED  10/27/2020   IR THROMBECT VENO MECH MOD SED  11/02/2020   IR US GUIDE VASC ACCESS LEFT  10/27/2020   IR US GUIDE VASC ACCESS RIGHT  09/26/2020   IR  US GUIDE VASC ACCESS RIGHT  11/02/2020   IR VENO/EXT/UNI RIGHT  11/02/2020   IR VENOCAVAGRAM IVC  10/27/2020   RETINAL DETACHMENT SURGERY     right knee arthroscopy     TEE WITHOUT CARDIOVERSION N/A 09/27/2013   Procedure: TRANSESOPHAGEAL ECHOCARDIOGRAM (TEE);  Surgeon: Lelon Perla, MD;  Location: [facility] ENDOSCOPY;  Service: Cardiovascular;  Laterality: N/A;   WRIST SURGERY      There were no vitals filed for this visit.   Subjective Assessment - 04/27/21 1811     Subjective "They keep changing my medicine"    Currently in Pain? No/denies                   ADULT SLP TREATMENT - 04/27/21 1812       General Information   Behavior/Cognition Alert;Cooperative;Pleasant mood;Requires cueing;Impulsive      Treatment Provided   Treatment provided Cognitive-Linquistic      Cognitive-Linquistic Treatment   Treatment focused on Cognition;Patient/family/caregiver education    Skilled Treatment Pt reported frustration related to recent medication management. SLP reviewed HEP with accurate answers noted but limited organization. SLP cued attention to appropriate placements on page, which aided ability be read and understood. Pt believed his "communication skills" such as writing as not at baseline. SLP suggested  voice texting and reviewed energy conservation techniques to reduce fatigue following cognitive or physical tasks.      Assessment / Recommendations / Plan   Plan Continue with current plan of care      Progression Toward Goals   Progression toward goals Progressing toward goals              SLP Education - 04/27/21 1815     Education Details voice texting, energy conservation techniques    Person(s) Educated Patient    Methods Demonstration;Explanation;Verbal cues    Comprehension Verbalized understanding;Returned demonstration;Need further instruction              SLP Short Term Goals - 04/17/21 1709       SLP SHORT TERM GOAL #1   Title Pt will complete  dysarthria HEP with occasional min A over 2 sessions    Status Not Met      SLP SHORT TERM GOAL #2   Title Pt will produce loud /a/ or "hey!" with at least high 80s dB average over 2 sessions    Status Not Met      SLP SHORT TERM GOAL #3   Title Pt will generate abdominal breathing in 16/20 answers to SLP questions over 2 sessions    Baseline 01-18-21    Status Partially Met      SLP SHORT TERM GOAL #4   Title Pt will demonstrate dysarthria compensations in 10 minute simple to mod complex conversation to be 75% intelligibile with occasional min A over 2 sessions    Status Not Met      SLP SHORT TERM GOAL #5   Title Pt will complete formal cognitive linguistic assessment in first 1-2 ST sessions    Status Achieved      SLP SHORT TERM GOAL #6   Title Pt will use memory and attention compensations for appointments, medicine management, and other daily activities with occasional min A over 2 sessions    Status Not Met              SLP Long Term Goals - 04/27/21 1818       SLP LONG TERM GOAL #1   Title Pt will complete dysarthria HEP with rare min A over 2 sessions    Baseline 03-20-21, 03-22-21    Status Achieved      SLP LONG TERM GOAL #2   Title Pt will demonstrate dysarthria compensations in 10 minute simple conversation to be 90% intelligibile with rare min A over 3 sessions    Baseline 03-22-21; 03-29-21, 04-03-21    Status Achieved      SLP LONG TERM GOAL #3   Title Pt will demo improved sustained and selective attention during therapeutic tasks with less than 3 cues for active listening during 2 sessions    Baseline 04-05-21    Status Achieved      SLP LONG TERM GOAL #4   Title Pt will demonstrate anticipatory awareness of impulsivity with min cues to modify behaviors during 2 sessions    Baseline 04-11-21    Time 4    Period Weeks    Status On-going   and ongoing   Target Date 05/18/21      SLP LONG TERM GOAL #5   Title Pt will report reduced frustration and  improved communication effectiveness via PROM by 2 points at last ST sessions    Baseline CES: 15.5 & V-RQOL:35    Time 4    Period Weeks  Status On-going    Target Date 05/18/21      SLP LONG TERM GOAL #6   Title Pt will selectively attend to cognitive linguistic task for 5 minutes with 3 or less cues for redirection over 3 sessions    Baseline 04-03-21, 04-11-21    Status Achieved              Plan - 04/27/21 1353     Clinical Impression Statement "Walter [name]" presents for OPST intervention secondary to stroke in March 2022.  Pt agrees to cont cognition. SLP targeted attention to detail and energy conservation techniques as pt reported more frequent fatigue. Occasional cues required to increase attention to detail and error awareness. See daily note for details. Walter [name] would cont to benefit from skilled ST to address cognition to maximize communication effectiveness and optimize functional independence and safety at home.    Speech Therapy Frequency 2x / week    Duration --   16 weeks   Treatment/Interventions Cueing hierarchy;Functional tasks;Patient/family education;Cognitive reorganization;Multimodal communcation approach;Language facilitation;Compensatory techniques;Internal/external aids;SLP instruction and feedback    Potential to Achieve Goals Good    Potential Considerations Severity of impairments    Consulted and Agree with Plan of Care Patient             Patient will benefit from skilled therapeutic intervention in order to improve the following deficits and impairments:   Cognitive communication deficit    Problem List Patient Active Problem List   Diagnosis Date Noted   Residual cognitive deficit as late effect of stroke 04/19/2021   Hemiparesis affecting right side as late effect of stroke ([place]) 04/19/2021   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 04/19/2021   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 ([place]) 04/19/2021   Thrombosis     Sleep disturbance    Dysphagia, post-stroke    PAF (paroxysmal atrial fibrillation) ([place])    Acute pulmonary embolism without acute cor pulmonale (HCC)    Malnutrition of moderate degree 11/04/2020   Tracheostomy care ([place])    Pressure injury of skin 10/23/2020   Intracranial hemorrhage (H[facility])    Atrial fibrillation ([place])    Prediabetes    Leukocytosis    Acute blood loss anemia    Hypernatremia    ICH (intracerebral hemorrhage) ([place]) 09/25/2020   Unilateral primary osteoarthritis, left knee 09/26/2016   Atrial flutter ([place]) 09/25/2013   Near syncope 09/25/2013    Alinda Deem, [name] 04/27/2021, 6:21 PM  [facility] [address] [place], [place], [address] Phone: [phone]   Fax:  [phone]   Name: Walter [name] MRN: [id number] Date of Birth: [birth year]

## 2021-04-27 NOTE — Therapy (Addendum)
OUTPATIENT PHYSICAL THERAPY TREATMENT NOTE/PROGRESS NOTE Patient Name: Walter [name] MRN: [id number] DOB:1956/01/02, 65 y.o., male Today's Date: 04/27/2021  PCP: Alroy Dust, L.Marlou Sa, MD REFERRING PROVIDER: Alroy Dust, Carlean Jews.Marlou Sa, MD     04/27/21 1440  PT Visits / Re-Eval  Visit Number 36  Number of Visits 44  Date for PT Re-Evaluation 05/04/21  Authorization  Authorization Type Medicare 12/26/20 to 02/20/21; Recert 1/60/73 to 71/06/26; 94W PN/recert on 11/01/60 to 70/3/50  Authorization Time Period 03/09/21-05/04/21  Progress Note Due on Visit 40  PT Time Calculation  PT Start Time 1400  PT Stop Time 1445  PT Time Calculation (min) 45 min  PT - End of Session  Equipment Utilized During Treatment Gait belt  Activity Tolerance Patient tolerated treatment well  Behavior During Therapy WFL for tasks assessed/performed                Past Medical History:  Diagnosis Date   Atrial flutter ([place])    s/p ablation   DVT (deep venous thrombosis) (HCC)    Near syncope 09/25/2013   Paroxysmal atrial fibrillation ([place])    Stroke [facility])    initially hemorragic (not on [place]) followed by subsequent embolic stroke   Past Surgical History:  Procedure Laterality Date   A-FLUTTER ABLATION N/A 03/09/2020   Procedure: A-FLUTTER ABLATION;  Surgeon: Thompson Grayer, MD;  Location: [facility] CV LAB;  Service: Cardiovascular;  Laterality: N/A;   CARDIOVERSION N/A 09/27/2013   Procedure: CARDIOVERSION;  Surgeon: Lelon Perla, MD;  Location: [facility] ENDOSCOPY;  Service: Cardiovascular;  Laterality: N/A;   IR ANGIO EXTERNAL CAROTID SEL EXT CAROTID UNI R MOD SED  09/26/2020   IR ANGIO INTRA EXTRACRAN SEL COM CAROTID INNOMINATE BILAT MOD SED  09/26/2020   IR ANGIO VERTEBRAL SEL VERTEBRAL UNI R MOD SED  09/26/2020   IR IVC FILTER PLMT / S&I /IMG GUID/MOD SED  10/08/2020   IR IVUS EACH ADDITIONAL NON CORONARY VESSEL  11/02/2020   IR PTA VENOUS EXCEPT DIALYSIS CIRCUIT  11/02/2020   IR RADIOLOGIST EVAL & MGMT   01/17/2021   IR THROMBECT VENO MECH MOD SED  10/27/2020   IR THROMBECT VENO MECH MOD SED  11/02/2020   IR US GUIDE VASC ACCESS LEFT  10/27/2020   IR US GUIDE VASC ACCESS RIGHT  09/26/2020   IR US GUIDE VASC ACCESS RIGHT  11/02/2020   IR VENO/EXT/UNI RIGHT  11/02/2020   IR VENOCAVAGRAM IVC  10/27/2020   RETINAL DETACHMENT SURGERY     right knee arthroscopy     TEE WITHOUT CARDIOVERSION N/A 09/27/2013   Procedure: TRANSESOPHAGEAL ECHOCARDIOGRAM (TEE);  Surgeon: Lelon Perla, MD;  Location: [facility] ENDOSCOPY;  Service: Cardiovascular;  Laterality: N/A;   WRIST SURGERY     Patient Active Problem List   Diagnosis Date Noted   Residual cognitive deficit as late effect of stroke 04/19/2021   Hemiparesis affecting right side as late effect of stroke ([place]) 04/19/2021   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 04/19/2021   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 ([place]) 04/19/2021   Thrombosis    Sleep disturbance    Dysphagia, post-stroke    PAF (paroxysmal atrial fibrillation) ([place])    Acute pulmonary embolism without acute cor pulmonale (HCC)    Malnutrition of moderate degree 11/04/2020   Tracheostomy care ([place])    Pressure injury of skin 10/23/2020   Intracranial hemorrhage (HCC)    Atrial fibrillation (HCC)    Prediabetes    Leukocytosis    Acute blood loss anemia  Hypernatremia    ICH (intracerebral hemorrhage) ([place]) 09/25/2020   Unilateral primary osteoarthritis, left knee 09/26/2016   Atrial flutter ([place]) 09/25/2013   Near syncope 09/25/2013  See Note below for Objective Data and Assessment of Progress/Goals.  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 Sharol Given, has  stopped Topamax on his own due to side effects  TODAY'S TREATMENT:  04/27/21  Seated placing and retrieving 4KG ball from floor to waist level 15x  Lifting 4 KG ball from waist level to OH 15x  Moving 4 KG ball from hip to hip 15x  STS with OH reaching against black t-band resistance Scifit seat 18, L4, arms 7 x 8 min  BERG score 32/56  04/24/21  Ambulation of 110f with 6# on LLE and 3# on cane, Min/ModA needed at times due to ataxia and possible lethargy from new meds, several LOB episodes noted requiring PT assist to prevent falls  Seated core exercises of hip/shoulder tosses, chops and Vs with 6# ball 10x each task  Sit to stand using green t-band for resisted OH reach once standing  Seated forward reaching to floor to retrieve 4KG ball and reach [facility] it, 10x        04/13/21 Pt seen for aquatic therapy today.  Treatment took place in water 3.25-4.8 ft [facility] Temp of water was 92.Pt entered/exited the pool via stairs step to pattern independently with bilat rail.                           Amb progressed to  with and without hand buoys forward, backward (posterior chain activation) and side stepping/walking cues for increased step length, posture and weight shifting x 4 lengths of pool. Pt requiring min assist with backward amb.   Standing -hip add/abd and extension x 15 reps holding to pool wall. VC for increases speed to increase resistance.   -hip extension 2 x 10 -step ups 70% submerged x15 R/L -step over (step) vc for placement and control of le/core x 10  Gait training 50% submerged cues for direct path, control of le/placement of feet.     Supine suspension Standing<>supine using 2 foam hand buoys x5 reps holding position x 10 second each. Vc and demo with min TC to gain position -in above position knees to chest with assist from therapist to maintain position 3 x 10 core strengthening   Aerobic capacity Swimming crawl and breast  stroke 6 lengths of pool sba continuously.  VC and TC for straight path   Pt requires buoyancy for support and to offload joints with strengthening exercises. Viscosity of the water is needed for resistance of strengthening; water current perturbations provides challenge to standing balance unsupported, requiring increased core activation.     04/11/21  Seated on blue disk, forward reaching to floor wih 4KG ball to lap and repeated w/o ball to vary resistance levels and have patient adapt  Searwd on blue disk, lifting 4KG ball OH and repeating w/o ball to challenge core and adapt to varied resistance challenges   Seated LAQs on disk, 6# wts on L ankle, boot on R  with lat press alternating pattern, 1x15  Seated  marching on disk, 6# wts on L ankle, boot on R, with lat press alternating pattern 1x15  15x STS performing OH press against red band once standing, CGA needed for stability  152ft ambulation with 3# weighted cane, 6# on L ankle, requiring Min/ModA to prevent fall  166ft ambulaton with 6# weighted L ankle and RW with Min/ModA needed to stabilize   04/10/21  PRacticed bathroom transfer: ambulated 100 feet with RW to bathroom with SBA for trasnfers and walking. Pt needed verbal cues to make sure he has walker in front of him when he is getting off the toilet so he doesn't have to reach for it on his side. Biodex harness suppoted treadmill walking: harness for safety only, 100% Body weight - 1.36mph for 3' with bil HHA, cues for controlled heel strike, "make your steps quieter" Rest - 1.5mph for 3' with 5% incline and then 1.13mph for 3' with 0% incline: total 6', bil HHA        04/06/21 Pt seen for aquatic therapy today.  Treatment took place in water 3.25-4.8 ft in depth at the [employer] pool. Temp of water was 92.Pt entered/exited the pool via stairs step to pattern independently with bilat rail.                           Amb progressed to hand buoys forward, backward  (posterior chain activation) and side stepping/walking cues for increased step length, posture and weight shifting x 4 lengths of pool. Pt requiring min assist with backward amb.   Standing -hip add/abd and extension x 15 reps holding to pool wall. VC for increases speed to increase resistance.  Attempted completion with support of ue buoys but pt unable to maintain standing posture -hip extension 2 x 10 -water walking 4 widths holding 2 foam buoys at hips/submerged    Supine suspension Standing<>supine using 2 foam hand buoys x7 reps holding position x 10 second each. Vc and demo with min TC to gain position -in above position knees to chest with assist from therapist to maintain position 3 x 10 core strengthening   Aerobic capacity Using yellow noodle flutter kicking and frog kicking 6 lengths, pt able to maintain position indep "Swimming" 4 lengths breast and crawl stroke with cga for maintaining straight path.   Pt requires buoyancy for support and to offload joints with strengthening exercises. Viscosity of the water is needed for resistance of strengthening; water current perturbations provides challenge to standing balance unsupported, requiring increased core activation.    04/03/21  Seated on green disk, forward reaching to floor wih 2.5KG ball followed by [facility] reaching ea. For 20 reps, single movement Seated trunk flexion/extension over sitting disk, extension blocked by bolster, 15x with 2.5KG ball  Seated B rotation holding 2.5KG ball 20x, hip to hip  Seated LAQs on disk, 4# wts, with lat press alternating pattern, 1x20  Seated marching on disk, 4# wts with lat press alternating pattern 1x20  Supine SLR circled CW/CCW against red band 2 min duration ea. LE  5x STS in 23.5s   03/29/21  Seated on green disk, forward reaching to floor wih 2KG ball followed by [facility] reaching ea. For 20 reps, single movement Seated trunk flexion/extension over sitting disk, extension blocked by bolster,  15x.  Seated B rotation holding 2KG ball 20x, hip to hip  Seated LAQs on disk, 3# wts, with lat press alternating pattern, 2x15  Seated marching on disk, 3# wts  with lat press alternating pattern  Supine SLR circled CW/CCW against red band 1 min duration ea. LE  Scifit L4 8'       03/27/21  Seated on green disk, forward reaching to floor wih 3KG ball followed by [facility] reaching ea. For 15 reps Seated trunk flexion/extension over sitting disk, extension blocked by bolster, 15x.  Seated B rotation holding 3KG ball 15x, hip to hip  Ambulation with QC 63f, SPC 355fthen SPSt James Healthcareith 3# wt [id number] Min/ModA for balance corrections  From green disk, STS 15x with 3KG ball   Seated on green disk, OH press with 3KG ball      03/22/21  Completed gait training x 230 ft with RW, PT facilitating at pelvis for improved weight shift and cues for controlled stepping to promote stability. Then progressed to gait training with negotiation around obstacles (cones) x 3 laps, cues to stay aligned within walker, most challenge noted with sharp turns. CGA throughout. Continued cues for improved turn and alignment with surface prior to descent with sit <> stand.   Seated on green air disc without UE support, completed lateral weight shift to R/L x 15 reps working on control and cues for posture/core activation. Then progressed to weight shift with reaching for cone in various directions x 10 reps.   Tall Kneeling on Mat with BUE support on Bench positioned in front of patient. CGA to get into/out of position. In tall kneeling completed hip hinges 2 x 10 reps working on improved return to upright and maintian hip extension, cues to keep posture upright. Then completed alternating UE raises x 10 reps bilaterally. Lateral reaching to PT's hand x 5 reps bilateral directions. Then trialed no UE support, difficulty maintaining tall kneeling position without assistance.    03/20/21 Seated trunk flexion/extension holding 5#  ball 15X  Seated SB holding 5# ball 15x with PT stabilization dropping to each elbow  STS 15x with 5# ball SLR stabilization against blue t-band 60s duration x2 ea. LE DKTC over red physioball 15x   LTR 15x over red physioball  Scifit seat 20 arms 7 L2 x8mi73m 03/14/21  Seated marching, alt, 6# 15x per LE with latissimus press while seated on green disk to challenge seated balance  Seated LAQs 6# alt. 15x per LE with latissimus press while seated on green disk to challenge seated balance   STS with OH punch using yellow t-band 10x  SLR circles against red band, 60s each leg in supine position  Ambulation of 200ft88fside of clinic with RW with CGA to accommodate facility fire drill  Supine/sit transitions 5x to ea. Side with random stopping points  Quadriped on elbows WS forward and back 10x  03/12/21 Ambulation of 460ft 57f RW and CGA, cued to pause walking before pivot turning to focus on task at hand and break down tasks into individual  components to control ataxia Seated on green disk, alternating marching and LAQs with lat press to activate core, 3# ankle weights, alternating pattern, 15 reps per LE Seated on disk, forward reaching using RW 10x              Seated core using disk and 3.3# ball using hip/shoulder tosses, chops and Vs for 10 reps per task  STS with OH reach once standing, 10 reps 03/09/21 Gait training: standing up from standard chair with large based quad cane walking up around cone (10 feet away) and sitting back down in chair: 2x, cues for 3step  pattern and sequencing, min A to mod A required 1 x 230 feet with RW, CGA to min A with cues for smaller steps BBS performed 5x sit to stand performed.   03/01/2021 Pt seen for aquatic therapy today.  Treatment took place in water 3.25-4.8 ft in depth at the [employer] pool. Temp of water was 92. entered/exited the pool via stairs step to pattern independently with bilat rail.  Introduced pt to aquatic setting.                            Amb using water walker. forward, backward and side stepping/walking cues for increased step length, posture and weight shifting x 12 minutes. Pt requiring mod to min asst. Multiple LOB.  Seated -sit to stand transfers from water bench submerged to waist when standing.  Cueing for weight shift/execution. Initally completed with water walker, progressed to thick yellow noodle.  Pt requiring min-cga for completion. 2 x 5 Marching x 10 LAQ x 10.  Standing -step ups x leading with L/R LE.  Cueing for decreased impulsiveness /improved concentration on finishing first movement before moving to next. -toe raises 2 x 10 Cueing for balance/slowing movements  Supine suspension Flutter kicking x 5 min supported by Pt  Pt requires buoyancy for support and to offload joints with strengthening exercises. Viscosity of the water is needed for resistance of strengthening; water current perturbations provides challenge to standing balance unsupported, requiring increased core activation.   02/28/21: Gait training: 1 x 460 feet with tactile cues at pelvis to guide weight shifts cues for slower cadence and turning slowly Standing on floor: OH reaching and fwd reaching: alternating between reaches: 10x each Standing touching toes: 5x Gait training: chair to chair transfer with 180 deg turn: quad cane, CGA to min A: 4x  02/26/21: Gait training: 1 x 420 feet with tactile cues at pelvis to guide weight shifts cues for slower cadence and turning slowly Sit to stand: 10x no HHA Standing at countertop with wide BOS: moving basket with 20lbs from side to side to increase core engagement and weight shifts Picking up basket with 20lb from counter to chest: 5x Working on ankle strategy: standing heel and toe raises without HHA: very close guarding: 2 x 10  Standing contralateral reaching: 10x R and L    PATIENT EDUCATION: Education details: Scheduling more PT visits for October Person  educated: Patient and caregiver Education method: Consulting civil engineer, Media planner, and Verbal cues Education comprehension: verbalized understanding     HOME EXERCISE PROGRAM: House ambulation with Cgs in walking boot   ASSESSMENT:   CLINICAL IMPRESSION:  Continues with mild lethargy/flat affect compared to baseline disposition.  Able to perform all tasks with minimal fatigue.  Due to balance issues today, treatment focused on seated transfer and core strengthening tasks.  BERG score of 32/56 continues to show balance deficits    REHAB POTENTIAL: Good   CLINICAL DECISION MAKING: Stable/uncomplicated  GOALS: Goals reviewed with patient? Yes   SHORT TERM GOALS:   STG Name Target Date Goal status  1 Patient will be able to perform chair to chair transfer with CGA/SBA with use of RW for safety to improve independence. Baseline: min to mod A (12/26/20); CGA/minA (03/09/21) 01/23/2021 03/27/21 Able to transfer STS and stand pivot with SBA, goal met  2 Pt will be able to ambulate 115' with RW with CGA to min A with RW for safety to improve independence Baseline: min to  mod A with RW 145'; 230 feet with RW and CGA to MIN A 01/23/2021 03/09/21 Goal met  3 Pt will be able to maintain standing balance for 30 sec to improve static balance Baseline: 10 sec before needing help due to LOB (12/26/20) 01/23/2021 02/05/21 able to stand with S for 30s, goal met  4. Pt will be able to perform sit to stand transfers with SBA to improve independence 04/06/21 Goal met 03/27/21  5. Pt will be able to perform 230 feet of walking with RW with SBA/CGA to improve household ambulation  04/06/21 Revised 03/09/21 04/03/21 UTA due to achilles partial tear    LONG TERM GOALS:   LTG Name Target Date Goal status  1 Pt will demo >5 points improvement on Berg Balance Scale to improve static balance Baseline: 11/56 01/03/21 05/28/2021 INITIAL 03/09/21 BBS 21/56 04/03/21 UTA due to achilles partial tear  2 Pt will be able to ambulate >500  feet with RW and SBA to improve walking endurance and safety. Baseline: 145' with RW min to mod A; 230 feet with CGA to Min A (03/09/21)  05/28/21 04/24/21 150f with weighted cane and LLE needing CGA/MinA  3 Pt  will be able to perform gait speed improvement by 0.145m to improve community ambulation. Baseline:0.2673m(03/09/21) 05/28/21 Progressing continue 04/03/21 UTA due to achilles partial tear  4 Pt will demo 5 sec improvement in 5x sit to stand (with or without UE support) to improve functional strength Baseline: 5x sit to stand with bil UE 17 sec;  05/28/21 Progressing continue 04/03/21 Assessed at 23.5s, performance limited by R walking boot    PLAN: PT FREQUENCY: 2-3x/week   PT DURATION: 8 weeks   PLANNED INTERVENTIONS: Aquatic therapy, Therapeutic exercises, Therapeutic activity, Neuro Muscular re-education, Balance training, Gait training, Patient/Family education, Joint mobilization, Stair training, Visual/preceptual remediation/compensation, Wheelchair mobility training, Cryotherapy, Moist heat, and Manual therapy   PLAN FOR NEXT SESSION:  Continue core and balance strengthening until patient becomes more proficient/ returns to baseline status.  Review transfer safety and proper technique, ambulation with weighted cane and LLE as appropriate  Visit Diagnosis: No diagnosis found.     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 04/19/2021   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 (HCCTox[place]0/20/2022   Thrombosis    Sleep disturbance    Dysphagia, post-stroke    PAF (paroxysmal atrial fibrillation) ([place]CC[place]  Acute pulmonary embolism without acute cor pulmonale (HCC)    Malnutrition of moderate degree 11/04/2020   Tracheostomy care ([place]  Pressure  injury of skin 10/23/2020   Intracranial hemorrhage (HCC)    Atrial fibrillation ([place]CC[place]  Prediabetes    Leukocytosis    Acute blood loss anemia    Hypernatremia    ICH (intracerebral hemorrhage) ([place]CC[place]ter[place] City3/28/2022   Unilateral primary osteoarthritis, left knee 09/26/2016   Atrial flutter (HCCTaney3/28/2015   Near syncope 09/25/2013    JefLanice ShirtsPT 04/27/2021, 2:07 PM  ConLoup City29[address] Will[address] AvenueiScotchtowneLaramieC,[place]one: [phone]:  [phone]

## 2021-04-27 NOTE — Therapy (Signed)
[facility] [address] [place], [place], [address] Phone: [phone]   Fax:  [phone]  Occupational Therapy Treatment  Patient Details  Name: Walter [name] MRN: [id number] Date of Birth: 17-Jul-1955 Referring Provider (OT): Dr. Alysia Penna   Encounter Date: 04/27/2021   OT End of Session - 04/27/21 1505     Visit Number 32    Number of Visits 71    Date for OT Re-Evaluation 04/27/21    Authorization Type Medicare Part A & B **need to add KX**    Authorization - Visit Number 18    Authorization - Number of Visits 40    Progress Note Due on Visit 64    OT Start Time 1235    OT Stop Time 1315    OT Time Calculation (min) 40 min    Activity Tolerance Patient tolerated treatment well    Behavior During Therapy [facility] for tasks assessed/performed             Past Medical History:  Diagnosis Date   Atrial flutter ([place])    s/p ablation   DVT (deep venous thrombosis) ([place])    Near syncope 09/25/2013   Paroxysmal atrial fibrillation ([place])    Stroke ([place])    initially hemorragic (not on [place]) followed by subsequent embolic stroke    Past Surgical History:  Procedure Laterality Date   A-FLUTTER ABLATION N/A 03/09/2020   Procedure: A-FLUTTER ABLATION;  Surgeon: Thompson Grayer, MD;  Location: [facility];  Service: Cardiovascular;  Laterality: N/A;   CARDIOVERSION N/A 09/27/2013   Procedure: CARDIOVERSION;  Surgeon: Lelon Perla, MD;  Location: [facility] ENDOSCOPY;  Service: Cardiovascular;  Laterality: N/A;   IR ANGIO EXTERNAL CAROTID SEL EXT CAROTID UNI R MOD SED  09/26/2020   IR ANGIO INTRA EXTRACRAN SEL COM CAROTID INNOMINATE BILAT MOD SED  09/26/2020   IR ANGIO VERTEBRAL SEL VERTEBRAL UNI R MOD SED  09/26/2020   IR IVC FILTER PLMT / S&I /IMG GUID/MOD SED  10/08/2020   IR IVUS EACH ADDITIONAL NON CORONARY VESSEL  11/02/2020   IR PTA VENOUS EXCEPT DIALYSIS CIRCUIT  11/02/2020   IR RADIOLOGIST EVAL & MGMT   01/17/2021   IR THROMBECT VENO MECH MOD SED  10/27/2020   IR THROMBECT VENO MECH MOD SED  11/02/2020   IR US GUIDE VASC ACCESS LEFT  10/27/2020   IR US GUIDE VASC ACCESS RIGHT  09/26/2020   IR US GUIDE VASC ACCESS RIGHT  11/02/2020   IR VENO/EXT/UNI RIGHT  11/02/2020   IR VENOCAVAGRAM IVC  10/27/2020   RETINAL DETACHMENT SURGERY     right knee arthroscopy     TEE WITHOUT CARDIOVERSION N/A 09/27/2013   Procedure: TRANSESOPHAGEAL ECHOCARDIOGRAM (TEE);  Surgeon: Lelon Perla, MD;  Location: [facility] ENDOSCOPY;  Service: Cardiovascular;  Laterality: N/A;   WRIST SURGERY      There were no vitals filed for this visit.   Subjective Assessment - 04/27/21 1241     Subjective  Pt reports he stopped taking topamx    Pertinent History ICH of brainstem (complicated by hydrocephalus, DVTs, and tracheostomy).  PMH:  a-fib    Limitations fall risk, ataxia, impulsivity    Patient Stated Goals use L side of body better (decrease ataxia), stay active and heal brain    Currently in Pain? No/denies                Treatment: Pt came walking towards gym without supervision. Therapist helped pt into  bathroom with Min A with RW, Pt insisted  that therapist leave him along in the bathroom.Therapist then assisted pt out of the bathroom for safety.Therapist checked progress towards goals and discussed with pt/ wife that pt appears to have more difficulty with attention this week and he seems overall frustrates.  Pt/ wife to discuss plans for going forward and whether taking a break from therapy would be helpful.  Therapist applied tape to nasal portion of pt's glasses as he is maintaining right eye shut due to diplopia(vertical). Pt reports that the tape helps and his balance when walking to ST appeared improved following taping. [name] was instructed to remove if any issues.                   OT Short Term Goals - 04/27/21 1248       OT SHORT TERM GOAL #1   Title Pt/caregiver will be independent  with initial HEP for LUE coordination and R hand strength.--    Time 4    Period Weeks    Status Achieved   needs continued reinforcement, pt is not using consistently   Target Date 03/02/21      OT SHORT TERM GOAL #2   Title Pt/caregiver will be independent with vision HEP and visual compensation strategies.    Time 4    Period Weeks    Status Achieved   Pt/ family verbalize understanding of vision HEP, needs continued reinforcement of compensatory strategeis- pt is not performing head turns     OT SHORT TERM GOAL #3   Title Pt will improve LUE coordination for ADLs as shown by improving score on box and blocks test by at least 6.    Baseline L-21 blocks    Time 4    Period Weeks    Status On-going   04/27/21-26 for LUE, (RUE 32)     OT SHORT TERM GOAL #4   Title Pt will perform simple snack prep/home maintenance task from w/c level mod I.    Time 4    Period Weeks    Status On-going   not consistent 04/27/21     OT SHORT TERM GOAL #5   Title Pt will verbalize understanding of compensation strategies for sensory deficts for incr safety.    Time 4    Period Weeks    Status Achieved   Pt verbalized that he has to be careful about temperature, needs additional reinforcement.  03/12/21:  met     OT SHORT TERM GOAL #6   Title Pt will improve R hand grip stength to at least 32lbs to assist with ADLs/opening containers.    Time 4    Period Weeks    Status Achieved   04/27/21-64.4, L 81.8     OT SHORT TERM GOAL #7   Title --               OT Long Term Goals - 04/27/21 1242       OT LONG TERM GOAL #1   Title Pt will be independent with updated HEP--    Time 12    Period Weeks    Status On-going      OT LONG TERM GOAL #2   Title Pt will improve L hand coordination for ADLs as shown by completing 9-hole peg test in less than 70sec.    Baseline 107sec    Time 12    Period Weeks    Status On-going   04/27/21 LUE-1 min  39 secs( RUE 35.16)     OT LONG TERM GOAL #3    Title Pt will be mod I with toileting.    Time 12    Period Weeks    Status Achieved   03/12/21:  supervision/CGA.  04/10/21:  met per pt report     OT LONG TERM GOAL #4   Title Pt will perform simple snack prep/home maintenance tasks in standing with supervision.    Time 12    Period Weeks    Status On-going   caregivers are performing     OT LONG TERM GOAL #5   Title Pt will demo good safety awareness/compensation for sensory deficits and attention to LUE during functional tasks and transfers without cueing.    Time 12    Period Weeks    Status On-going   04/17/21:  pt with difficulty/bumping into items with RW on L side at times, pt demo impulsivity at times with movement or with distractions with LUE movement (particularly with release of objects)     OT LONG TERM GOAL #6   Title Pt will perform simple environmental scanning/navigation with at least 90% accuracy for incr safety.    Time 12    Period Weeks    Status On-going   03/12/21:  70% with CGA for balance/ambulation.  04/17/21:  100% with scanning for objects, but needed close supervision/min cueing for safety/bumping into items on L side     OT LONG TERM GOAL #7   Title Pt will perform all basic ADLS with no more than supervision.    Time 12    Period Weeks    Status On-going   03/12/21:  min A-supervision.  04/17/21:  min A for boot, min A for shower transfer, supervision for tolieting                  Plan - 04/27/21 1506     Clinical Impression Statement Over the last week pt appears to demonstrate decreased attention and and flat affect. Pt has stopped taking topamax due to possible side effects. Therapist started checking progress towards goals. anticipate renewing next visit    OT Occupational Profile and History Detailed Assessment- Review of Records and additional review of physical, cognitive, psychosocial history related to current functional performance    Occupational performance deficits (Please refer  to evaluation for details): ADL's;IADL's;Work;Leisure;Social Participation    Body Structure / Function / Physical Skills ADL;Strength;Balance;Proprioception;UE functional use;IADL;Endurance;Vision;Mobility;Coordination;Decreased knowledge of precautions;FMC;GMC;Sensation    Cognitive Skills Attention;Safety Awareness;Memory;Perception;Problem Solve    Rehab Potential Good    Clinical Decision Making Several treatment options, min-mod task modification necessary    Comorbidities Affecting Occupational Performance: May have comorbidities impacting occupational performance    Modification or Assistance to Complete Evaluation  Min-Moderate modification of tasks or assist with assess necessary to complete eval    OT Frequency 3x / week    OT Duration 12 weeks    OT Treatment/Interventions Self-care/ADL training;Moist Heat;Fluidtherapy;DME and/or AE instruction;Balance training;Therapeutic activities;Aquatic Therapy;Ultrasound;Therapeutic exercise;Cognitive remediation/compensation;Visual/perceptual remediation/compensation;Functional Mobility Training;Neuromuscular education;Cryotherapy;Energy conservation;Manual Therapy;Patient/family education    Plan renew next visits, continues transfers/safety, environmental scanning/navigation, simple home maintenance    Consulted and Agree with Plan of Care Patient;Family member/caregiver    Family Member Consulted wife [name]             Patient will benefit from skilled therapeutic intervention in order to improve the following deficits and impairments:   Body Structure / Function / Physical Skills: ADL, Strength, Balance, Proprioception, UE  functional use, IADL, Endurance, Vision, Mobility, Coordination, Decreased knowledge of precautions, [place], GMC, Sensation Cognitive Skills: Attention, Safety Awareness, Memory, Perception, Problem Solve     Visit Diagnosis: Ataxia  Muscle weakness (generalized)  Unsteadiness on feet  Abnormal  posture  Visuospatial deficit  Other lack of coordination  Frontal lobe and executive function deficit    Problem List Patient Active Problem List   Diagnosis Date Noted   Residual cognitive deficit as late effect of stroke 04/19/2021   Hemiparesis affecting right side as late effect of stroke ([place]) 04/19/2021   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) 09/25/2020   Unilateral primary osteoarthritis, left knee 09/26/2016   Atrial flutter ([place]) 09/25/2013   Near syncope 09/25/2013    Maccoy Haubner, OT/L 04/27/2021, 3:08 PM  [facility] [address] [place], [place], [address] Phone: [phone]   Fax:  [phone]  Name: Walter [name] MRN: [id number] Date of Birth: [birth year]

## 2021-04-30 ENCOUNTER — Ambulatory Visit: Payer: Medicare Other

## 2021-04-30 ENCOUNTER — Ambulatory Visit
Admission: RE | Admit: 2021-04-30 | Discharge: 2021-04-30 | Disposition: A | Discharge status: Home / Self Care | Payer: Medicare Other | Source: Ambulatory Visit | Attending: Neurology | Admitting: Neurology

## 2021-04-30 ENCOUNTER — Other Ambulatory Visit: Payer: Self-pay

## 2021-04-30 ENCOUNTER — Encounter: Payer: Self-pay | Admitting: Occupational Therapy

## 2021-04-30 ENCOUNTER — Ambulatory Visit: Payer: Medicare Other | Admitting: Occupational Therapy

## 2021-04-30 DIAGNOSIS — R2689 Other abnormalities of gait and mobility: Secondary | ICD-10-CM

## 2021-04-30 DIAGNOSIS — M6281 Muscle weakness (generalized): Secondary | ICD-10-CM

## 2021-04-30 DIAGNOSIS — R2681 Unsteadiness on feet: Secondary | ICD-10-CM

## 2021-04-30 DIAGNOSIS — R293 Abnormal posture: Secondary | ICD-10-CM

## 2021-04-30 DIAGNOSIS — R41844 Frontal lobe and executive function deficit: Secondary | ICD-10-CM

## 2021-04-30 DIAGNOSIS — R208 Other disturbances of skin sensation: Secondary | ICD-10-CM

## 2021-04-30 DIAGNOSIS — R278 Other lack of coordination: Secondary | ICD-10-CM

## 2021-04-30 DIAGNOSIS — R270 Ataxia, unspecified: Secondary | ICD-10-CM

## 2021-04-30 DIAGNOSIS — R41841 Cognitive communication deficit: Secondary | ICD-10-CM | POA: Diagnosis not present

## 2021-04-30 DIAGNOSIS — R41842 Visuospatial deficit: Secondary | ICD-10-CM

## 2021-04-30 DIAGNOSIS — I6990 Unspecified sequelae of unspecified cerebrovascular disease: Secondary | ICD-10-CM | POA: Diagnosis not present

## 2021-04-30 MED ORDER — GADOBENATE DIMEGLUMINE 529 MG/ML IV SOLN
20.0000 mL | Freq: Once | INTRAVENOUS | Status: AC | PRN
  Administered 2021-04-30: 20 mL via INTRAVENOUS

## 2021-04-30 NOTE — Addendum Note (Signed)
Addended by: Hildred Laser on: 04/30/2021 11:44 AM   Modules accepted: Orders

## 2021-04-30 NOTE — Therapy (Addendum)
[facility] [address] [place], [place], [address] Phone: [phone]   Fax:  [phone]  Occupational Therapy Treatment  Patient Details  Name: Walter [name] MRN: [id number] Date of Birth: 1955-07-30 Referring Provider (OT): Dr. Alysia Penna   Encounter Date: 04/30/2021   OT End of Session - 05/02/21 0739     Visit Number 33    Number of Visits 17    Date for OT Re-Evaluation 07/09/20    Authorization Type Medicare Part A & B **need to add KX**    Authorization - Visit Number 32    Authorization - Number of Visits 40    Progress Note Due on Visit 40    Activity Tolerance Patient tolerated treatment well    Behavior During Therapy [facility] for tasks assessed/performed              Past Medical History:  Diagnosis Date   Atrial flutter ([place])    s/p ablation   DVT (deep venous thrombosis) ([place])    Near syncope 09/25/2013   Paroxysmal atrial fibrillation ([place])    Stroke [facility])    initially hemorragic (not on [facility]) followed by subsequent embolic stroke    Past Surgical History:  Procedure Laterality Date   A-FLUTTER ABLATION N/A 03/09/2020   Procedure: A-FLUTTER ABLATION;  Surgeon: Thompson Grayer, MD;  Location: [place] [facility];  Service: Cardiovascular;  Laterality: N/A;   CARDIOVERSION N/A 09/27/2013   Procedure: CARDIOVERSION;  Surgeon: Lelon Perla, MD;  Location: [facility] ENDOSCOPY;  Service: Cardiovascular;  Laterality: N/A;   IR ANGIO EXTERNAL CAROTID SEL EXT CAROTID UNI R MOD SED  09/26/2020   IR ANGIO INTRA EXTRACRAN SEL COM CAROTID INNOMINATE BILAT MOD SED  09/26/2020   IR ANGIO VERTEBRAL SEL VERTEBRAL UNI R MOD SED  09/26/2020   IR IVC FILTER PLMT / S&I /IMG GUID/MOD SED  10/08/2020   IR IVUS EACH ADDITIONAL NON CORONARY VESSEL  11/02/2020   IR PTA VENOUS EXCEPT DIALYSIS CIRCUIT  11/02/2020   IR RADIOLOGIST EVAL & MGMT  01/17/2021   IR THROMBECT VENO MECH MOD SED  10/27/2020   IR THROMBECT VENO MECH  MOD SED  11/02/2020   IR US GUIDE VASC ACCESS LEFT  10/27/2020   IR US GUIDE VASC ACCESS RIGHT  09/26/2020   IR US GUIDE VASC ACCESS RIGHT  11/02/2020   IR VENO/EXT/UNI RIGHT  11/02/2020   IR VENOCAVAGRAM IVC  10/27/2020   RETINAL DETACHMENT SURGERY     right knee arthroscopy     TEE WITHOUT CARDIOVERSION N/A 09/27/2013   Procedure: TRANSESOPHAGEAL ECHOCARDIOGRAM (TEE);  Surgeon: Lelon Perla, MD;  Location: [facility] ENDOSCOPY;  Service: Cardiovascular;  Laterality: N/A;   WRIST SURGERY      There were no vitals filed for this visit. Pain: Pt denies pain  Subjective Assessment - 05/02/21 0732     Subjective  Pt reports he stopped taking topamx    Pertinent History ICH of brainstem (complicated by hydrocephalus, DVTs, and tracheostomy).  PMH:  a-fib    Limitations fall risk, ataxia, impulsivity    Patient Stated Goals use L side of body better (decrease ataxia), stay active and heal brain    Currently in Pain? No/denies            Treatment:Prone on mat: Lifting chest, and scapular retraction with shoulder extension, min v.c and facilitation. Tall kneeling on elbows over ball rolling ball forwards and back wardsand rocking forwards and backwards then lifting alternate UE, ,  min-mod facilitation  Ambulating to locate items, 14/15 located of first pass, pt was more alert today and did not bump into items, minguard provided for balance Functional reaching with left and right UE's to place large pegs into  vertical pegboard with right and left UE, min difficulty with RUE, and mod difficulty withLUE due to ataxia                       OT Short Term Goals - 05/02/21 0732       OT SHORT TERM GOAL #1   Title Pt/caregiver will be independent with initial HEP for LUE coordination and R hand strength.--    Time 4    Period Weeks    Status Achieved   needs continued reinforcement, pt is not using consistently   Target Date 03/02/21      OT SHORT TERM GOAL #2   Title  Pt/caregiver will be independent with vision HEP and visual compensation strategies.    Time 4    Period Weeks    Status Achieved   Pt/ family verbalize understanding of vision HEP, needs continued reinforcement of compensatory strategeis- pt is not performing head turns     OT SHORT TERM GOAL #3   Title Pt will improve LUE coordination for ADLs as shown by improving score on box and blocks test by at least 6.    Baseline L-21 blocks    Time 4    Period Weeks    Status On-going   04/27/21-26 for LUE increased by 5 blocks, (RUE 32)     OT SHORT TERM GOAL #4   Title Pt will perform simple snack prep/home maintenance task from w/c level mod I.    Time 4    Period Weeks    Status Achieved   not consistent 04/27/21     OT SHORT TERM GOAL #5   Title Pt will verbalize understanding of compensation strategies for sensory deficts for incr safety.    Time 4    Period Weeks    Status Achieved   Pt verbalized that he has to be careful about temperature, needs additional reinforcement.  03/12/21:  met     Additional Short Term Goals   Additional Short Term Goals Yes      OT SHORT TERM GOAL #6   Title Pt will improve R hand grip stength to at least 32lbs to assist with ADLs/opening containers.    Time 4    Period Weeks    Status Achieved   04/27/21-64.4, L 81.8     OT SHORT TERM GOAL #7   Title Pt will consistently navigate a busy environment at a walker level with no more than 2 v.c . for walker safety and avoiding items.    Time 4    Period Weeks    Status New      OT SHORT TERM GOAL #8   Title Pt will perform transitional movements modified independently  without LOB for increased safety with ADLS.    Time 4    Period Weeks    Status New               OT Long Term Goals - 04/30/21 1136       OT LONG TERM GOAL #1   Title Pt will be independent with updated HEP--    Time 10    Period Weeks    Status On-going      OT LONG TERM GOAL #2  Title Pt will improve L hand  coordination for ADLs as shown by completing 9-hole peg test in less than 70sec.    Baseline 107sec    Time 10    Period Weeks    Status On-going   04/27/21 LUE-1 min 39 secs( RUE 35.16)     OT LONG TERM GOAL #3   Title Pt will be mod I with toileting.    Time 10    Period Weeks    Status Partially Met   met per pt report, however supervision recommended at htis time as pt balance appears worse since he took topamx, supervision recommended at this time10/31/22     OT LONG TERM GOAL #4   Title Pt will perform simple snack prep/home maintenance tasks in standing with supervision.    Time 10    Period Weeks    Status On-going   caregivers are performing     OT LONG TERM GOAL #5   Title Pt will demo good safety awareness/compensation for sensory deficits and attention to LUE during functional tasks and transfers without cueing.    Time 10    Period Weeks    Status On-going   04/17/21:  pt with difficulty/bumping into items with RW on L side at times, pt demo impulsivity at times with movement or with distractions with LUE movement (particularly with release of objects)     OT LONG TERM GOAL #6   Title Pt will perform simple environmental scanning/navigation with at least 90% accuracy for incr safety without v.c to avoid bumping items    Time 10    Period Weeks    Status Revised   03/12/21:  70% with CGA for balance/ambulation.  04/17/21:  100% with scanning for objects, but needed close supervision/min cueing for safety/bumping into items on L side     OT LONG TERM GOAL #7   Title Pt will perform all basic ADLS with no more than supervision.    Time 12    Period Weeks    Status On-going   03/12/21:  min A-supervision.  04/17/21:  min A for boot, min A for shower transfer, supervision for tolieting                  Plan - 05/02/21 0727     Clinical Impression Statement Pt demonstrates progress towards goals, however he has not met all goals due to severity of deficits and  recent trial of topamax which appeared to affect his cognition. Pt has stopped taking topamax and his cognition appeared better today. Pt can benefit from skilled occupational therapy to address the following deficits: decreased coordination, decreased balance, cogntive deficits, decreased sensation, decreased functional mobility and visual deficits which impedes performance of ADLS/ IADLs.    OT Occupational Profile and History Detailed Assessment- Review of Records and additional review of physical, cognitive, psychosocial history related to current functional performance    Occupational performance deficits (Please refer to evaluation for details): ADL's;IADL's;Work;Leisure;Social Participation    Body Structure / Function / Physical Skills ADL;Strength;Balance;Proprioception;UE functional use;IADL;Endurance;Vision;Mobility;Coordination;Decreased knowledge of precautions;FMC;GMC;Sensation    Cognitive Skills Attention;Safety Awareness;Memory;Perception;Problem Solve    Rehab Potential Good    Clinical Decision Making Several treatment options, min-mod task modification necessary    Comorbidities Affecting Occupational Performance: May have comorbidities impacting occupational performance    Modification or Assistance to Complete Evaluation  Min-Moderate modification of tasks or assist with assess necessary to complete eval    OT Frequency 2x / week  OT Duration --   10 weeks   OT Treatment/Interventions Self-care/ADL training;Moist Heat;Fluidtherapy;DME and/or AE instruction;Balance training;Therapeutic activities;Aquatic Therapy;Ultrasound;Therapeutic exercise;Cognitive remediation/compensation;Visual/perceptual remediation/compensation;Functional Mobility Training;Neuromuscular education;Cryotherapy;Energy conservation;Manual Therapy;Patient/family education    Plan continues transfers/safety, environmental scanning/navigation, simple home maintenance    Consulted and Agree with Plan of Care  Patient;Family member/caregiver    Family Member Consulted wife [name]             Patient will benefit from skilled therapeutic intervention in order to improve the following deficits and impairments:   Body Structure / Function / Physical Skills: ADL, Strength, Balance, Proprioception, UE functional use, IADL, Endurance, Vision, Mobility, Coordination, Decreased knowledge of precautions, [place], GMC, Sensation Cognitive Skills: Attention, Safety Awareness, Memory, Perception, Problem Solve     Visit Diagnosis: Unsteadiness on feet  Muscle weakness (generalized)  Other lack of coordination  Abnormal posture  Visuospatial deficit  Frontal lobe and executive function deficit  Other disturbances of skin sensation  Other abnormalities of gait and mobility    Problem List Patient Active Problem List   Diagnosis Date Noted   Residual cognitive deficit as late effect of stroke 04/19/2021   Hemiparesis affecting right side as late effect of stroke ([place]) 04/19/2021   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 04/19/2021   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 ([place]) 04/19/2021   Thrombosis    Sleep disturbance    Dysphagia, post-stroke    PAF (paroxysmal atrial fibrillation) ([facility])    Acute pulmonary embolism without acute cor pulmonale (HCC)    Malnutrition of moderate degree 11/04/2020   Tracheostomy care ([place])    Pressure injury of skin 10/23/2020   Intracranial hemorrhage (H[facility])    Atrial fibrillation (HCC)    Prediabetes    Leukocytosis    Acute blood loss anemia    Hypernatremia    ICH (intracerebral hemorrhage) ([place]) 09/25/2020   Unilateral primary osteoarthritis, left knee 09/26/2016   Atrial flutter ([place]) 09/25/2013   Near syncope 09/25/2013    Odyn Turko, OT/L 05/02/2021, 7:39 AM  [facility] [address] [place], [place], [address] Phone: [phone]   Fax:   [phone]  Name: ANAIS KOENEN MRN: [id number] Date of Birth: August 31, 1955

## 2021-04-30 NOTE — Therapy (Signed)
[facility] [address] [place], [place], [address] Phone: [phone]   Fax:  [phone]  Speech Language Pathology Treatment  Patient Details  Name: Walter [name] MRN: [id number] Date of Birth: Dec 20, 1955 Referring Provider (SLP): Charlett Blake, MD   Encounter Date: 04/30/2021   End of Session - 04/30/21 1341     Visit Number 34    Number of Visits 40    Date for SLP Re-Evaluation 05/18/21    Authorization Type Medicare    SLP Start Time 1317    SLP Stop Time  1400    SLP Time Calculation (min) 43 min    Activity Tolerance Patient tolerated treatment well             Past Medical History:  Diagnosis Date   Atrial flutter ([place])    s/p ablation   DVT (deep venous thrombosis) ([place])    Near syncope 09/25/2013   Paroxysmal atrial fibrillation ([place])    Stroke [facility])    initially hemorragic (not on [facility]) followed by subsequent embolic stroke    Past Surgical History:  Procedure Laterality Date   A-FLUTTER ABLATION N/A 03/09/2020   Procedure: A-FLUTTER ABLATION;  Surgeon: Thompson Grayer, MD;  Location: [facility] CV LAB;  Service: Cardiovascular;  Laterality: N/A;   CARDIOVERSION N/A 09/27/2013   Procedure: CARDIOVERSION;  Surgeon: Lelon Perla, MD;  Location: [facility] ENDOSCOPY;  Service: Cardiovascular;  Laterality: N/A;   IR ANGIO EXTERNAL CAROTID SEL EXT CAROTID UNI R MOD SED  09/26/2020   IR ANGIO INTRA EXTRACRAN SEL COM CAROTID INNOMINATE BILAT MOD SED  09/26/2020   IR ANGIO VERTEBRAL SEL VERTEBRAL UNI R MOD SED  09/26/2020   IR IVC FILTER PLMT / S&I /IMG GUID/MOD SED  10/08/2020   IR IVUS EACH ADDITIONAL NON CORONARY VESSEL  11/02/2020   IR PTA VENOUS EXCEPT DIALYSIS CIRCUIT  11/02/2020   IR RADIOLOGIST EVAL & MGMT  01/17/2021   IR THROMBECT VENO MECH MOD SED  10/27/2020   IR THROMBECT VENO MECH MOD SED  11/02/2020   IR US GUIDE VASC ACCESS LEFT  10/27/2020   IR US GUIDE VASC ACCESS RIGHT  09/26/2020   IR  US GUIDE VASC ACCESS RIGHT  11/02/2020   IR VENO/EXT/UNI RIGHT  11/02/2020   IR VENOCAVAGRAM IVC  10/27/2020   RETINAL DETACHMENT SURGERY     right knee arthroscopy     TEE WITHOUT CARDIOVERSION N/A 09/27/2013   Procedure: TRANSESOPHAGEAL ECHOCARDIOGRAM (TEE);  Surgeon: Lelon Perla, MD;  Location: [facility] ENDOSCOPY;  Service: Cardiovascular;  Laterality: N/A;   WRIST SURGERY      There were no vitals filed for this visit.   Subjective Assessment - 04/30/21 1320     Subjective "I'm good"    Patient is accompained by: Family member    Currently in Pain? No/denies                   ADULT SLP TREATMENT - 04/30/21 1329       General Information   Behavior/Cognition Alert;Pleasant mood;Requires cueing;Impulsive;Agitated      Treatment Provided   Treatment provided Cognitive-Linquistic      Cognitive-Linquistic Treatment   Treatment focused on Cognition;Patient/family/caregiver education    Skilled Treatment Pt completed HEP, with some debate exhibited for correct answers. Reduced mental flexability exhibited for SLP provided comparisons. Cues to attend to blanks x3 required. SLP targeted attention to detail and error awareness task, in which pt missed occasional errors. Repetition  of instructions required x2 as pt noted with differ instructions (mark out versus circle). Pt declined need to double check despite SLP prompt. Additional errors x10 found in step by step review of task with intermittent min SLP cues. Pt was increasing difficulty to redirect this session as pt included extraneous information and focused on personal preferences versus targeted task.      Assessment / Recommendations / Plan   Plan Continue with current plan of care   pt requested decrease to 1x/week     Progression Toward Goals   Progression toward goals Progressing toward goals              SLP Education - 04/30/21 1406     Education Details purpose of tasks    Person(s) Educated Patient     Methods Explanation;Demonstration;Handout    Comprehension Verbalized understanding;Returned demonstration;Verbal cues required;Need further instruction              SLP Short Term Goals - 04/17/21 1709       SLP SHORT TERM GOAL #1   Title Pt will complete dysarthria HEP with occasional min A over 2 sessions    Status Not Met      SLP SHORT TERM GOAL #2   Title Pt will produce loud /a/ or "hey!" with at least high 80s dB average over 2 sessions    Status Not Met      SLP SHORT TERM GOAL #3   Title Pt will generate abdominal breathing in 16/20 answers to SLP questions over 2 sessions    Baseline 01-18-21    Status Partially Met      SLP SHORT TERM GOAL #4   Title Pt will demonstrate dysarthria compensations in 10 minute simple to mod complex conversation to be 75% intelligibile with occasional min A over 2 sessions    Status Not Met      SLP SHORT TERM GOAL #5   Title Pt will complete formal cognitive linguistic assessment in first 1-2 ST sessions    Status Achieved      SLP SHORT TERM GOAL #6   Title Pt will use memory and attention compensations for appointments, medicine management, and other daily activities with occasional min A over 2 sessions    Status Not Met              SLP Long Term Goals - 04/30/21 1457       SLP LONG TERM GOAL #1   Title Pt will complete dysarthria HEP with rare min A over 2 sessions    Baseline 03-20-21, 03-22-21    Status Achieved      SLP LONG TERM GOAL #2   Title Pt will demonstrate dysarthria compensations in 10 minute simple conversation to be 90% intelligibile with rare min A over 3 sessions    Baseline 03-22-21; 03-29-21, 04-03-21    Status Achieved      SLP LONG TERM GOAL #3   Title Pt will demo improved sustained and selective attention during therapeutic tasks with less than 3 cues for active listening during 2 sessions    Baseline 04-05-21    Status Achieved      SLP LONG TERM GOAL #4   Title Pt will demonstrate  anticipatory awareness of impulsivity with min cues to modify behaviors during 2 sessions    Baseline 04-11-21    Time 4    Period Weeks    Status On-going   and ongoing   Target Date 05/18/21  SLP LONG TERM GOAL #5   Title Pt will report reduced frustration and improved communication effectiveness via PROM by 2 points at last ST sessions    Baseline CES: 15.5 & V-RQOL:35    Time 4    Period Weeks    Status On-going    Target Date 05/18/21      SLP LONG TERM GOAL #6   Title Pt will selectively attend to cognitive linguistic task for 5 minutes with 3 or less cues for redirection over 3 sessions    Baseline 04-03-21, 04-11-21    Status Achieved              Plan - 04/30/21 1406     Clinical Impression Statement "[name]" presents for OPST intervention secondary to stroke in March 2022.  Pt agrees to cont cognition. SLP targeted attention to detail and error awareness. Pt declined need to double check but multiple errors found in review. Mood appeared to limit performance this session. See daily note for details. [name] would cont to benefit from skilled ST to address cognition to maximize communication effectiveness and optimize functional independence and safety at home.    Speech Therapy Frequency 2x / week   pt requested dec to 1x/week   Duration --   16 weeks   Treatment/Interventions Cueing hierarchy;Functional tasks;Patient/family education;Cognitive reorganization;Multimodal communcation approach;Language facilitation;Compensatory techniques;Internal/external aids;SLP instruction and feedback    Potential to Achieve Goals Good    Potential Considerations Severity of impairments    Consulted and Agree with Plan of Care Patient             Patient will benefit from skilled therapeutic intervention in order to improve the following deficits and impairments:   Cognitive communication deficit    Problem List Patient Active Problem List   Diagnosis Date Noted    Residual cognitive deficit as late effect of stroke 04/19/2021   Hemiparesis affecting right side as late effect of stroke ([place]) 04/19/2021   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 04/19/2021   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 ([place]) 04/19/2021   Thrombosis    Sleep disturbance    Dysphagia, post-stroke    PAF (paroxysmal atrial fibrillation) ([place])    Acute pulmonary embolism without acute cor pulmonale (H[facility])    Malnutrition of moderate degree 11/04/2020   Tracheostomy care ([place])    Pressure injury of skin 10/23/2020   Intracranial hemorrhage ([facility])    Atrial fibrillation ([place])    Prediabetes    Leukocytosis    Acute blood loss anemia    Hypernatremia    ICH (intracerebral hemorrhage) ([place]) 09/25/2020   Unilateral primary osteoarthritis, left knee 09/26/2016   Atrial flutter ([place]) 09/25/2013   Near syncope 09/25/2013    Alinda Deem, [name] 04/30/2021, 2:57 PM  [facility] [address] [place], [place], [address] Phone: [phone]   Fax:  [phone]   Name: JALIK GELLATLY MRN: [id number] Date of Birth: 02/11/1956

## 2021-04-30 NOTE — Therapy (Signed)
OUTPATIENT PHYSICAL THERAPY TREATMENT NOTE/PROGRESS NOTE Patient Name: Walter [name] MRN: [id number] DOB:05-24-1956, 65 y.o., male Today's Date: 04/30/2021  PCP: Alroy Dust, L.Marlou Sa, MD REFERRING PROVIDER: Alroy Dust, Carlean Jews.Marlou Sa, MD     04/27/21 1440  PT Visits / Re-Eval  Visit Number 36  Number of Visits 44  Date for PT Re-Evaluation 05/04/21  Authorization  Authorization Type Medicare 12/26/20 to 02/20/21; Recert 3/78/58 to 85/02/77; 41O PN/recert on 02/04/85 to 76/7/20  Authorization Time Period 03/09/21-05/04/21  Progress Note Due on Visit 40  PT Time Calculation  PT Start Time 1400  PT Stop Time 1445  PT Time Calculation (min) 45 min  PT - End of Session  Equipment Utilized During Treatment Gait belt  Activity Tolerance Patient tolerated treatment well  Behavior During Therapy WFL for tasks assessed/performed                Past Medical History:  Diagnosis Date   Atrial flutter ([place])    s/p ablation   DVT (deep venous thrombosis) (HCC)    Near syncope 09/25/2013   Paroxysmal atrial fibrillation ([place])    Stroke [facility])    initially hemorragic (not on [place]) followed by subsequent embolic stroke   Past Surgical History:  Procedure Laterality Date   A-FLUTTER ABLATION N/A 03/09/2020   Procedure: A-FLUTTER ABLATION;  Surgeon: Thompson Grayer, MD;  Location: [place] CV LAB;  Service: Cardiovascular;  Laterality: N/A;   CARDIOVERSION N/A 09/27/2013   Procedure: CARDIOVERSION;  Surgeon: Lelon Perla, MD;  Location: [facility] ENDOSCOPY;  Service: Cardiovascular;  Laterality: N/A;   IR ANGIO EXTERNAL CAROTID SEL EXT CAROTID UNI R MOD SED  09/26/2020   IR ANGIO INTRA EXTRACRAN SEL COM CAROTID INNOMINATE BILAT MOD SED  09/26/2020   IR ANGIO VERTEBRAL SEL VERTEBRAL UNI R MOD SED  09/26/2020   IR IVC FILTER PLMT / S&I /IMG GUID/MOD SED  10/08/2020   IR IVUS EACH ADDITIONAL NON CORONARY VESSEL  11/02/2020   IR PTA VENOUS EXCEPT DIALYSIS CIRCUIT  11/02/2020   IR RADIOLOGIST EVAL & MGMT   01/17/2021   IR THROMBECT VENO MECH MOD SED  10/27/2020   IR THROMBECT VENO MECH MOD SED  11/02/2020   IR US GUIDE VASC ACCESS LEFT  10/27/2020   IR US GUIDE VASC ACCESS RIGHT  09/26/2020   IR US GUIDE VASC ACCESS RIGHT  11/02/2020   IR VENO/EXT/UNI RIGHT  11/02/2020   IR VENOCAVAGRAM IVC  10/27/2020   RETINAL DETACHMENT SURGERY     right knee arthroscopy     TEE WITHOUT CARDIOVERSION N/A 09/27/2013   Procedure: TRANSESOPHAGEAL ECHOCARDIOGRAM (TEE);  Surgeon: Lelon Perla, MD;  Location: [facility] ENDOSCOPY;  Service: Cardiovascular;  Laterality: N/A;   WRIST SURGERY     Patient Active Problem List   Diagnosis Date Noted   Residual cognitive deficit as late effect of stroke 04/19/2021   Hemiparesis affecting right side as late effect of stroke ([place]) 04/19/2021   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 04/19/2021   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 ([place]) 04/19/2021   Thrombosis    Sleep disturbance    Dysphagia, post-stroke    PAF (paroxysmal atrial fibrillation) ([place])    Acute pulmonary embolism without acute cor pulmonale (HCC)    Malnutrition of moderate degree 11/04/2020   Tracheostomy care ([place])    Pressure injury of skin 10/23/2020   Intracranial hemorrhage (HCC)    Atrial fibrillation (HCC)    Prediabetes    Leukocytosis    Acute blood loss anemia  Hypernatremia    ICH (intracerebral hemorrhage) ([place]) 09/25/2020   Unilateral primary osteoarthritis, left knee 09/26/2016   Atrial flutter ([place]) 09/25/2013   Near syncope 09/25/2013  See Note below for Objective Data and Assessment of Progress/Goals.  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   THERAPY DIAG: gait disorder  PERTINENT HISTORY: atrial flutter s/p ablation, DVT, near syncope, CVA  PRECAUTIONS: fall  SUBJECTIVE: No issues reported with R ankle   TODAY'S  TREATMENT:  04/30/21 Seated placing and retrieving 4KG ball from floor to waist level 15x  Lifting 4 KG ball from waist level to OH 15x  Moving 4 KG ball from hip to hip 15x  STS with OH reaching against black t-band resistance Scifit seat 18, L5, arms 7 x 8 min  Ambulation in // bars, 5 trips fwd and retro, 5 trips sidestepping In // bars, stepping and WS onto 6" block 10x per leg   04/27/21  Seated placing and retrieving 4KG ball from floor to waist level 15x  Lifting 4 KG ball from waist level to OH 15x  Moving 4 KG ball from hip to hip 15x  STS with OH reaching against black t-band resistance Scifit seat 18, L4, arms 7 x 8 min  BERG score 32/56  04/24/21  Ambulation of 142f with 6# on LLE and 3# on cane, Min/ModA needed at times due to ataxia and possible lethargy from new meds, several LOB episodes noted requiring PT assist to prevent falls  Seated core exercises of hip/shoulder tosses, chops and Vs with 6# ball 10x each task  Sit to stand using green t-band for resisted OH reach once standing  Seated forward reaching to floor to retrieve 4KG ball and reach OH with it, 10x        04/13/21 Pt seen for aquatic therapy today.  Treatment took place in water 3.25-4.[facility] Temp of water was 92.Pt entered/exited the pool via stairs step to pattern independently with bilat rail.                           Amb progressed to  with and without hand buoys forward, backward (posterior chain activation) and side stepping/walking cues for increased step length, posture and weight shifting x 4 lengths of pool. Pt requiring min assist with backward amb.   Standing -hip add/abd and extension x 15 reps holding to pool wall. VC for increases speed to increase resistance.   -hip extension 2 x 10 -step ups 70% submerged x15 R/L -step over (step) vc for placement and control of le/core x 10  Gait training 50% submerged cues for direct path, control of  le/placement of feet.     Supine suspension Standing<>supine using 2 foam hand buoys x5 reps holding position x 10 second each. Vc and demo with min TC to gain position -in above position knees to chest with assist from therapist to maintain position 3 x 10 core strengthening   Aerobic capacity Swimming crawl and breast stroke 6 lengths of pool sba continuously.  VC and TC for straight path   Pt requires buoyancy for support and to offload joints with strengthening exercises. Viscosity of the water is needed for resistance of strengthening; water current perturbations provides challenge to standing balance unsupported, requiring increased core activation.     04/11/21  Seated on blue disk,  forward reaching to floor wih 4KG ball to lap and repeated w/o ball to vary resistance levels and have patient adapt  Searwd on blue disk, lifting 4KG ball OH and repeating w/o ball to challenge core and adapt to varied resistance challenges   Seated LAQs on disk, 6# wts on L ankle, boot on R  with lat press alternating pattern, 1x15  Seated marching on disk, 6# wts on L ankle, boot on R, with lat press alternating pattern 1x15  15x STS performing OH press against red band once standing, CGA needed for stability  169f ambulation with 3# weighted cane, 6# on L ankle, requiring Min/ModA to prevent fall  1128fambulaton with 6# weighted L ankle and RW with Min/ModA needed to stabilize   04/10/21  PRacticed bathroom transfer: ambulated 100 feet with RW to bathroom with SBA for trasnfers and walking. Pt needed verbal cues to make sure he has walker in front of him when he is getting off the toilet so he doesn't have to reach for it on his side. Biodex harness suppoted treadmill walking: harness for safety only, 100% Body weight - 1.75m90mfor 3' with bil HHA, cues for controlled heel strike, "make your steps quieter" Rest - 1.4mp40mor 3' with 5% incline and then 1.8mph35mr 3' with 0% incline: total 6', bil  HHA        04/06/21 Pt seen for aquatic therapy today.  Treatment took place in water 3.25-4.8 ft in [facility]. Temp of water was 92.Pt entered/exited the pool via stairs step to pattern independently with bilat rail.                           Amb progressed to hand buoys forward, backward (posterior chain activation) and side stepping/walking cues for increased step length, posture and weight shifting x 4 lengths of pool. Pt requiring min assist with backward amb.   Standing -hip add/abd and extension x 15 reps holding to pool wall. VC for increases speed to increase resistance.  Attempted completion with support of ue buoys but pt unable to maintain standing posture -hip extension 2 x 10 -water walking 4 widths holding 2 foam buoys at hips/submerged    Supine suspension Standing<>supine using 2 foam hand buoys x7 reps holding position x 10 second each. Vc and demo with min TC to gain position -in above position knees to chest with assist from therapist to maintain position 3 x 10 core strengthening   Aerobic capacity Using yellow noodle flutter kicking and frog kicking 6 lengths, pt able to maintain position indep "Swimming" 4 lengths breast and crawl stroke with cga for maintaining straight path.   Pt requires buoyancy for support and to offload joints with strengthening exercises. Viscosity of the water is needed for resistance of strengthening; water current perturbations provides challenge to standing balance unsupported, requiring increased core activation.    04/03/21  Seated on green disk, forward reaching to floor wih 2.5KG ball followed by OH reO[facility] - [facility] Wayhing ea. For 20 reps, single movement Seated trunk flexion/extension over sitting disk, extension blocked by bolster, 15x with 2.5KG ball  Seated B rotation holding 2.5KG ball 20x, hip to hip  Seated LAQs on disk, 4# wts, with lat press alternating pattern, 1x20  Seated marching on disk, 4# wts with lat  press alternating pattern 1x20  Supine SLR circled CW/CCW against red band 2 min duration ea. LE  5x STS in 23.5s  03/29/21  Seated on green disk, forward reaching to floor wih 2KG ball followed by [facility] reaching ea. For 20 reps, single movement Seated trunk flexion/extension over sitting disk, extension blocked by bolster, 15x.  Seated B rotation holding 2KG ball 20x, hip to hip  Seated LAQs on disk, 3# wts, with lat press alternating pattern, 2x15  Seated marching on disk, 3# wts with lat press alternating pattern  Supine SLR circled CW/CCW against red band 1 min duration ea. LE  Scifit L4 8'       03/27/21  Seated on green disk, forward reaching to floor wih 3KG ball followed by [facility] reaching ea. For 15 reps Seated trunk flexion/extension over sitting disk, extension blocked by bolster, 15x.  Seated B rotation holding 3KG ball 15x, hip to hip  Ambulation with QC 49f, SPC 382fthen SPLexington Va Medical Centerith 3# wt [id number] Min/ModA for balance corrections  From green disk, STS 15x with 3KG ball   Seated on green disk, OH press with 3KG ball      03/22/21  Completed gait training x 230 ft with RW, PT facilitating at pelvis for improved weight shift and cues for controlled stepping to promote stability. Then progressed to gait training with negotiation around obstacles (cones) x 3 laps, cues to stay aligned within walker, most challenge noted with sharp turns. CGA throughout. Continued cues for improved turn and alignment with surface prior to descent with sit <> stand.   Seated on green air disc without UE support, completed lateral weight shift to R/L x 15 reps working on control and cues for posture/core activation. Then progressed to weight shift with reaching for cone in various directions x 10 reps.   Tall Kneeling on Mat with BUE support on Bench positioned in front of patient. CGA to get into/out of position. In tall kneeling completed hip hinges 2 x 10 reps working on improved return to upright  and maintian hip extension, cues to keep posture upright. Then completed alternating UE raises x 10 reps bilaterally. Lateral reaching to PT's hand x 5 reps bilateral directions. Then trialed no UE support, difficulty maintaining tall kneeling position without assistance.    03/20/21 Seated trunk flexion/extension holding 5# ball 15X  Seated SB holding 5# ball 15x with PT stabilization dropping to each elbow  STS 15x with 5# ball SLR stabilization against blue t-band 60s duration x2 ea. LE DKTC over red physioball 15x   LTR 15x over red physioball  Scifit seat 20 arms 7 L2 x8mi1m 03/14/21  Seated marching, alt, 6# 15x per LE with latissimus press while seated on green disk to challenge seated balance  Seated LAQs 6# alt. 15x per LE with latissimus press while seated on green disk to challenge seated balance   STS with OH punch using yellow t-band 10x  SLR circles against red band, 60s each leg in supine position  Ambulation of 200ft52fside of clinic with RW with CGA to accommodate facility fire drill  Supine/sit transitions 5x to ea. Side with random stopping points  Quadriped on elbows WS forward and back 10x  03/12/21 Ambulation of 460ft 57f RW and CGA, cued to pause walking before pivot turning to focus on task at hand and break down tasks into individual  components to control ataxia Seated on green disk, alternating marching and LAQs with lat press to activate core, 3# ankle weights, alternating pattern, 15 reps per LE Seated on disk, forward reaching using RW 10x  Seated core using disk and 3.3# ball using hip/shoulder tosses, chops and Vs for 10 reps per task  STS with OH reach once standing, 10 reps 03/09/21 Gait training: standing up from standard chair with large based quad cane walking up around cone (10 feet away) and sitting back down in chair: 2x, cues for 3step pattern and sequencing, min A to mod A required 1 x 230 feet with RW, CGA to min A with cues for smaller  steps BBS performed 5x sit to stand performed.   03/01/2021 Pt seen for aquatic therapy today.  Treatment took place in water 3.25-4.8 ft in depth at the [facility] pool. Temp of water was 92. entered/exited the pool via stairs step to pattern independently with bilat rail.  Introduced pt to aquatic setting.                           Amb using water walker. forward, backward and side stepping/walking cues for increased step length, posture and weight shifting x 12 minutes. Pt requiring mod to min asst. Multiple LOB.  Seated -sit to stand transfers from water bench submerged to waist when standing.  Cueing for weight shift/execution. Initally completed with water walker, progressed to thick yellow noodle.  Pt requiring min-cga for completion. 2 x 5 Marching x 10 LAQ x 10.  Standing -step ups x leading with L/R LE.  Cueing for decreased impulsiveness /improved concentration on finishing first movement before moving to next. -toe raises 2 x 10 Cueing for balance/slowing movements  Supine suspension Flutter kicking x 5 min supported by Pt  Pt requires buoyancy for support and to offload joints with strengthening exercises. Viscosity of the water is needed for resistance of strengthening; water current perturbations provides challenge to standing balance unsupported, requiring increased core activation.   02/28/21: Gait training: 1 x 460 feet with tactile cues at pelvis to guide weight shifts cues for slower cadence and turning slowly Standing on floor: OH reaching and fwd reaching: alternating between reaches: 10x each Standing touching toes: 5x Gait training: chair to chair transfer with 180 deg turn: quad cane, CGA to min A: 4x  02/26/21: Gait training: 1 x 420 feet with tactile cues at pelvis to guide weight shifts cues for slower cadence and turning slowly Sit to stand: 10x no HHA Standing at countertop with wide BOS: moving basket with 20lbs from side to side to increase  core engagement and weight shifts Picking up basket with 20lb from counter to chest: 5x Working on ankle strategy: standing heel and toe raises without HHA: very close guarding: 2 x 10  Standing contralateral reaching: 10x R and L    PATIENT EDUCATION: Education details: Scheduling more PT visits for October Person educated: Patient and caregiver Education method: Consulting civil engineer, Media planner, and Verbal cues Education comprehension: verbalized understanding     HOME EXERCISE PROGRAM: House ambulation with Cgs in RW  ASSESSMENT:   CLINICAL IMPRESSION:  Less lethargic/flat affect today, feels topomax is out of his system.  More control in seated trunk activities w/o LOB noted.  Attempted gait with 2 canes but it was too difficult to control 2 Ads.  Able to place R foot onto step with good accuracy but L foot placement was less corrdinated.    REHAB POTENTIAL: Good   CLINICAL DECISION MAKING: Stable/uncomplicated  GOALS: Goals reviewed with patient? Yes   SHORT TERM GOALS:   STG Name Target Date Goal status  1 Patient will  be able to perform chair to chair transfer with CGA/SBA with use of RW for safety to improve independence. Baseline: min to mod A (12/26/20); CGA/minA (03/09/21) 01/23/2021 03/27/21 Able to transfer STS and stand pivot with SBA, goal met  2 Pt will be able to ambulate 115' with RW with CGA to min A with RW for safety to improve independence Baseline: min to mod A with RW 145'; 230 feet with RW and CGA to MIN A 01/23/2021 03/09/21 Goal met  3 Pt will be able to maintain standing balance for 30 sec to improve static balance Baseline: 10 sec before needing help due to LOB (12/26/20) 01/23/2021 02/05/21 able to stand with S for 30s, goal met  4. Pt will be able to perform sit to stand transfers with SBA to improve independence 04/06/21 Goal met 03/27/21  5. Pt will be able to perform 230 feet of walking with RW with SBA/CGA to improve household ambulation  04/06/21 Revised  03/09/21 04/03/21 UTA due to achilles partial tear    LONG TERM GOALS:   LTG Name Target Date Goal status  1 Pt will demo >5 points improvement on Berg Balance Scale to improve static balance Baseline: 11/56 01/03/21 05/28/2021 INITIAL 03/09/21 BBS 21/56 04/03/21 UTA due to achilles partial tear  2 Pt will be able to ambulate >500 feet with RW and SBA to improve walking endurance and safety. Baseline: 145' with RW min to mod A; 230 feet with CGA to Min A (03/09/21)  05/28/21 04/24/21 154f with weighted cane and LLE needing CGA/MinA  3 Pt  will be able to perform gait speed improvement by 0.177m to improve community ambulation. Baseline:0.2662m(03/09/21) 05/28/21 Progressing continue 04/03/21 UTA due to achilles partial tear  4 Pt will demo 5 sec improvement in 5x sit to stand (with or without UE support) to improve functional strength Baseline: 5x sit to stand with bil UE 17 sec;  05/28/21 Progressing continue 04/03/21 Assessed at 23.5s, performance limited by R walking boot    PLAN: PT FREQUENCY: 2-3x/week   PT DURATION: 8 weeks   PLANNED INTERVENTIONS: Aquatic therapy, Therapeutic exercises, Therapeutic activity, Neuro Muscular re-education, Balance training, Gait training, Patient/Family education, Joint mobilization, Stair training, Visual/preceptual remediation/compensation, Wheelchair mobility training, Cryotherapy, Moist heat, and Manual therapy   PLAN FOR NEXT SESSION:  Continue core and balance strengthening until patient becomes more proficient/ returns to baseline status.  Review transfer safety and proper technique, ambulation with weighted cane and LLE as appropriate, // bars ambulation and stepping tasks  Visit Diagnosis: No diagnosis found.     Problem List Patient Active Problem List   Diagnosis Date Noted   Residual cognitive deficit as late effect of stroke 04/19/2021   Hemiparesis affecting right side as late effect of stroke (HCCRoss Corner0/20/2022   Ataxia due to old  intracerebral hemorrhage 04/19/2021   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 (HCCDistrict Heights0/20/2022   Thrombosis    Sleep disturbance    Dysphagia, post-stroke    PAF (paroxysmal atrial fibrillation) ([place]  Acute pulmonary embolism without acute cor pulmonale (HCC)    Malnutrition of moderate degree 11/04/2020   Tracheostomy care ([place]CCParcelas [place]uel[place]  Pressure injury of skin 10/23/2020   Intracranial hemorrhage (HCC)    Atrial fibrillation (HCC)    Prediabetes    Leukocytosis    Acute blood loss anemia    Hypernatremia    ICH (intracerebral hemorrhage) ([place]CCGreen[place]3/28/2022   Unilateral primary osteoarthritis, left knee 09/26/2016   Atrial flutter (  [place]) 09/25/2013   Near syncope 09/25/2013    Lanice Shirts, PT 04/30/2021, 11:56 AM  [facility] [address] [place], [place], [address] Phone: [phone]   Fax:  [phone]

## 2021-05-02 ENCOUNTER — Other Ambulatory Visit: Payer: Self-pay

## 2021-05-02 ENCOUNTER — Ambulatory Visit (HOSPITAL_BASED_OUTPATIENT_CLINIC_OR_DEPARTMENT_OTHER): Payer: Medicare Other | Attending: Physical Medicine & Rehabilitation | Admitting: Physical Therapy

## 2021-05-02 DIAGNOSIS — R2681 Unsteadiness on feet: Secondary | ICD-10-CM

## 2021-05-02 DIAGNOSIS — M6281 Muscle weakness (generalized): Secondary | ICD-10-CM

## 2021-05-02 DIAGNOSIS — R293 Abnormal posture: Secondary | ICD-10-CM

## 2021-05-02 DIAGNOSIS — M25561 Pain in right knee: Secondary | ICD-10-CM | POA: Insufficient documentation

## 2021-05-02 DIAGNOSIS — R2689 Other abnormalities of gait and mobility: Secondary | ICD-10-CM

## 2021-05-02 DIAGNOSIS — G8929 Other chronic pain: Secondary | ICD-10-CM | POA: Diagnosis present

## 2021-05-02 DIAGNOSIS — R262 Difficulty in walking, not elsewhere classified: Secondary | ICD-10-CM | POA: Diagnosis present

## 2021-05-02 NOTE — Therapy (Signed)
OUTPATIENT PHYSICAL THERAPY TREATMENT NOTE/PROGRESS NOTE Patient Name: Walter [name] MRN: [id number] DOB:1955/07/14, 65 y.o., male Today's Date: 05/02/2021  PCP: Alroy Dust, L.Marlou Sa, MD REFERRING PROVIDER: Charlett Blake, MD     04/27/21 1440  PT Visits / Re-Eval  Visit Number 36  Number of Visits 44  Date for PT Re-Evaluation 05/04/21  Authorization  Authorization Type Medicare 12/26/20 to 02/20/21; Recert 3/54/65 to 68/12/75; 17G PN/recert on 0/1/74 to 94/4/96  Authorization Time Period 03/09/21-05/04/21  Progress Note Due on Visit 40  PT Time Calculation  PT Start Time 1400  PT Stop Time 1445  PT Time Calculation (min) 45 min  PT - End of Session  Equipment Utilized During Treatment Gait belt  Activity Tolerance Patient tolerated treatment well  Behavior During Therapy [facility] for tasks assessed/performed       PT End of Session - 05/02/21 0831     Visit Number 38    Number of Visits 44    Date for PT Re-Evaluation 07/02/21    Authorization Type MCR    PT Start Time 0815    PT Stop Time 0900    PT Time Calculation (min) 45 min    Activity Tolerance Patient tolerated treatment well    Behavior During Therapy WFL for tasks assessed/performed                     Past Medical History:  Diagnosis Date   Atrial flutter ([place])    s/p ablation   DVT (deep venous thrombosis) (HCC)    Near syncope 09/25/2013   Paroxysmal atrial fibrillation (HCC)    Stroke ([place])    initially hemorragic (not on [place]) followed by subsequent embolic stroke   Past Surgical History:  Procedure Laterality Date   A-FLUTTER ABLATION N/A 03/09/2020   Procedure: A-FLUTTER ABLATION;  Surgeon: Thompson Grayer, MD;  Location: [place] [facility];  Service: Cardiovascular;  Laterality: N/A;   CARDIOVERSION N/A 09/27/2013   Procedure: CARDIOVERSION;  Surgeon: Lelon Perla, MD;  Location: [facility] ENDOSCOPY;  Service: Cardiovascular;  Laterality: N/A;   IR ANGIO EXTERNAL CAROTID SEL EXT CAROTID  UNI R MOD SED  09/26/2020   IR ANGIO INTRA EXTRACRAN SEL COM CAROTID INNOMINATE BILAT MOD SED  09/26/2020   IR ANGIO VERTEBRAL SEL VERTEBRAL UNI R MOD SED  09/26/2020   IR IVC FILTER PLMT / S&I /IMG GUID/MOD SED  10/08/2020   IR IVUS EACH ADDITIONAL NON CORONARY VESSEL  11/02/2020   IR PTA VENOUS EXCEPT DIALYSIS CIRCUIT  11/02/2020   IR RADIOLOGIST EVAL & MGMT  01/17/2021   IR THROMBECT VENO MECH MOD SED  10/27/2020   IR THROMBECT VENO MECH MOD SED  11/02/2020   IR US GUIDE VASC ACCESS LEFT  10/27/2020   IR US GUIDE VASC ACCESS RIGHT  09/26/2020   IR US GUIDE VASC ACCESS RIGHT  11/02/2020   IR VENO/EXT/UNI RIGHT  11/02/2020   IR VENOCAVAGRAM IVC  10/27/2020   RETINAL DETACHMENT SURGERY     right knee arthroscopy     TEE WITHOUT CARDIOVERSION N/A 09/27/2013   Procedure: TRANSESOPHAGEAL ECHOCARDIOGRAM (TEE);  Surgeon: Lelon Perla, MD;  Location: [facility] ENDOSCOPY;  Service: Cardiovascular;  Laterality: N/A;   WRIST SURGERY     Patient Active Problem List   Diagnosis Date Noted   Residual cognitive deficit as late effect of stroke 04/19/2021   Hemiparesis affecting right side as late effect of stroke ([place]) 04/19/2021   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 04/19/2021  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 ([place]) 04/19/2021   Thrombosis    Sleep disturbance    Dysphagia, post-stroke    PAF (paroxysmal atrial fibrillation) (HCC)    Acute pulmonary embolism without acute cor pulmonale (HCC)    Malnutrition of moderate degree 11/04/2020   Tracheostomy care ([place])    Pressure injury of skin 10/23/2020   Intracranial hemorrhage (HCC)    Atrial fibrillation (HCC)    Prediabetes    Leukocytosis    Acute blood loss anemia    Hypernatremia    ICH (intracerebral hemorrhage) ([place]) 09/25/2020   Unilateral primary osteoarthritis, left knee 09/26/2016   Atrial flutter ([place]) 09/25/2013   Near syncope 09/25/2013  See Note below for Objective Data and Assessment of  Progress/Goals.  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 Will be getting membership to wellness center upon returning.  TODAY'S TREATMENT:  05/02/21 Pt seen for aquatic therapy today.  Treatment took place in water 3.25-4.8 ft in depth at the [facility] pool. Temp of water was 92.Pt entered/exited the pool via stairs step to pattern independently with bilat rail.                           Warm up: in lap pool pt swam crawl stroke 1.5 laps with sup   Standing -step ups 70% submerged x15 R/L -side stepping onto water step R/L x 15. Cues wofr weight shift and maintaining balance/control. Slow pace. -step over (step) vc slowing pace x 10 -High Knee Balance with Backward Walking with Hand Floats R/L -High Knee Balance with Forward Walking and Hand Floats R/L   Pt requires buoyancy for support and to offload joints with strengthening exercises. Viscosity of the water is needed for resistance of strengthening; water current perturbations provides challenge to standing balance unsupported, requiring increased core activation.    04/30/21 Seated placing and retrieving 4KG ball from floor to waist level 15x  Lifting 4 KG ball from waist level to OH 15x  Moving 4 KG ball from hip to hip 15x  STS with OH reaching against black t-band resistance Scifit seat 18, L5, arms 7 x 8 min  Ambulation in // bars, 5 trips fwd and retro, 5 trips sidestepping In // bars, stepping and WS onto 6" block 10x per leg   04/27/21  Seated placing and retrieving 4KG ball from floor to waist level  15x  Lifting 4 KG ball from waist level to OH 15x  Moving 4 KG ball from hip to hip 15x  STS with OH reaching against black t-band resistance Scifit seat 18, L4, arms 7 x 8 min  BERG score 32/56  04/24/21  Ambulation of 186f with 6# on LLE and 3# on cane, Min/ModA needed at times due to ataxia and possible lethargy from new meds, several LOB episodes noted requiring PT assist to prevent falls  Seated core exercises of hip/shoulder tosses, chops and Vs with 6# ball 10x each task  Sit to stand using green t-band for resisted OH reach once standing  Seated forward reaching to floor to retrieve 4KG ball and  reach [facility] with it, 10x        04/13/21 Pt seen for aquatic therapy today.  Treatment took place in water 3.25-4.8 ft in depth at the [facility] pool. Temp of water was 92.Pt entered/exited the pool via stairs step to pattern independently with bilat rail.                           Amb progressed to  with and without hand buoys forward, backward (posterior chain activation) and side stepping/walking cues for increased step length, posture and weight shifting x 4 lengths of pool. Pt requiring min assist with backward amb.   Standing -hip add/abd and extension x 15 reps holding to pool wall. VC for increases speed to increase resistance.   -hip extension 2 x 10 -step ups 70% submerged x15 R/L -step over (step) vc for placement and control of le/core x 10  Gait training 50% submerged cues for direct path, control of le/placement of feet.     Supine suspension Standing<>supine using 2 foam hand buoys x5 reps holding position x 10 second each. Vc and demo with min TC to gain position -in above position knees to chest with assist from therapist to maintain position 3 x 10 core strengthening   Aerobic capacity Swimming crawl and breast stroke 6 lengths of pool sba continuously.  VC and TC for straight path   Pt requires buoyancy for support and to offload joints with strengthening  exercises. Viscosity of the water is needed for resistance of strengthening; water current perturbations provides challenge to standing balance unsupported, requiring increased core activation.     04/11/21  Seated on blue disk, forward reaching to floor wih 4KG ball to lap and repeated w/o ball to vary resistance levels and have patient adapt  Searwd on blue disk, lifting 4KG ball OH and repeating w/o ball to challenge core and adapt to varied resistance challenges   Seated LAQs on disk, 6# wts on L ankle, boot on R  with lat press alternating pattern, 1x15  Seated marching on disk, 6# wts on L ankle, boot on R, with lat press alternating pattern 1x15  15x STS performing OH press against red band once standing, CGA needed for stability  140f ambulation with 3# weighted cane, 6# on L ankle, requiring Min/ModA to prevent fall  [id number] with 6# weighted L ankle and RW with Min/ModA needed to stabilize     PATIENT EDUCATION: Education details: Scheduling more PT visits for October Person educated: Patient and caregiver Education method: ExConsulting civil engineerDeMedia plannerand Verbal cues Education comprehension: verbalized understanding     HOME EXERCISE PROGRAM: House ambulation with Cgs in RW/Consider membership to a pool.  ASSESSMENT:   CLINICAL IMPRESSION:  Pt with impressive ability to swim the crawl stroke in lap pool. He does lack some coordination for rotating head sideways to right to breath limiting his distance.  Worked mainly on proprioception and coordination. May add weight to rle next session for approximation purposes for improved coordinated movement.    REHAB POTENTIAL: Good   CLINICAL DECISION MAKING: Stable/uncomplicated  GOALS: Goals reviewed with patient? Yes   SHORT TERM GOALS:   STG Name Target Date Goal status  1 Patient will be able to perform chair to chair transfer with CGA/SBA with use of RW for safety to improve independence. Baseline: min to mod A  (12/26/20); CGA/minA (03/09/21) 01/23/2021 03/27/21 Able to transfer STS and stand pivot with SBA, goal  met  2 Pt will be able to ambulate 115' with RW with CGA to min A with RW for safety to improve independence Baseline: min to mod A with RW 145'; 230 feet with RW and CGA to MIN A 01/23/2021 03/09/21 Goal met  3 Pt will be able to maintain standing balance for 30 sec to improve static balance Baseline: 10 sec before needing help due to LOB (12/26/20) 01/23/2021 02/05/21 able to stand with S for 30s, goal met  4. Pt will be able to perform sit to stand transfers with SBA to improve independence 04/06/21 Goal met 03/27/21  5. Pt will be able to perform 230 feet of walking with RW with SBA/CGA to improve household ambulation  04/06/21 Revised 03/09/21 04/03/21 UTA due to achilles partial tear    LONG TERM GOALS:   LTG Name Target Date Goal status  1 Pt will demo >5 points improvement on Berg Balance Scale to improve static balance Baseline: 11/56 01/03/21 05/28/2021 INITIAL 03/09/21 BBS 21/56 04/03/21 UTA due to achilles partial tear  2 Pt will be able to ambulate >500 feet with RW and SBA to improve walking endurance and safety. Baseline: 145' with RW min to mod A; 230 feet with CGA to Min A (03/09/21)  05/28/21 04/24/21 151f with weighted cane and LLE needing CGA/MinA  3 Pt  will be able to perform gait speed improvement by 0.196m to improve community ambulation. Baseline:0.2662m(03/09/21) 05/28/21 Progressing continue 04/03/21 UTA due to achilles partial tear  4 Pt will demo 5 sec improvement in 5x sit to stand (with or without UE support) to improve functional strength Baseline: 5x sit to stand with bil UE 17 sec;  05/28/21 Progressing continue 04/03/21 Assessed at 23.5s, performance limited by R walking boot    PLAN: PT FREQUENCY: 2-3x/week   PT DURATION: 8 weeks   PLANNED INTERVENTIONS: Aquatic therapy, Therapeutic exercises, Therapeutic activity, Neuro Muscular re-education, Balance training, Gait  training, Patient/Family education, Joint mobilization, Stair training, Visual/preceptual remediation/compensation, Wheelchair mobility training, Cryotherapy, Moist heat, and Manual therapy   PLAN FOR NEXT SESSION:  Continue core and balance strengthening until patient becomes more proficient/ returns to baseline status.  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 04/19/2021   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 (HCCFrankton0/20/2022   Thrombosis    Sleep disturbance    Dysphagia, post-stroke    PAF (paroxysmal atrial fibrillation) ([place]CCWallow[place]  Acute pulmonary embolism without acute cor pulmonale (HCC)    Malnutrition of moderate degree 11/04/2020   Trache[place] care ([place]  Pressure injury of skin 10/23/2020   Intracranial hemorrhage (HCC)    Atrial fibrillation ([place]CCFl[place]  Prediabetes    Leukocytosis    Acute blood loss anemia    Hypernatremia    ICH (intracerebral hemorrhage) (HCCFoster City3/28/2022   Unilateral primary osteoarthritis, left knee 09/26/2016   Atrial flutter (HCCClarendon3/28/2015   Near syncope 09/25/2013    MarVedia Pereyra  MPT 05/02/2021, 6:32 PM  [facility] [address] [place], [place], [address] Phone: [phone]   Fax:  [phone]

## 2021-05-02 NOTE — Therapy (Addendum)
[facility] [address] [place], [place], [address] Phone: [phone]   Fax:  [phone]  Occupational Therapy Treatment  Patient Details  Name: Walter [name] MRN: [id number] Date of Birth: 1955-07-26 Referring Provider (OT): Dr. Alysia Penna   Encounter Date: 04/30/2021   OT End of Session - 05/02/21 0739     Visit Number 33    Number of Visits 7    Date for OT Re-Evaluation 07/09/20    Authorization Type Medicare Part A & B **need to add KX**    Authorization - Visit Number 29    Authorization - Number of Visits 40    Progress Note Due on Visit 62    OT Start Time 1235    OT Stop Time 1315    OT Time Calculation (min) 40 min    Activity Tolerance Patient tolerated treatment well    Behavior During Therapy [facility] for tasks assessed/performed             Past Medical History:  Diagnosis Date   Atrial flutter ([place])    s/p ablation   DVT (deep venous thrombosis) ([place])    Near syncope 09/25/2013   Paroxysmal atrial fibrillation ([place])    Stroke ([place])    initially hemorragic (not on [place]) followed by subsequent embolic stroke    Past Surgical History:  Procedure Laterality Date   A-FLUTTER ABLATION N/A 03/09/2020   Procedure: A-FLUTTER ABLATION;  Surgeon: Thompson Grayer, MD;  Location: [facility];  Service: Cardiovascular;  Laterality: N/A;   CARDIOVERSION N/A 09/27/2013   Procedure: CARDIOVERSION;  Surgeon: Lelon Perla, MD;  Location: [facility] ENDOSCOPY;  Service: Cardiovascular;  Laterality: N/A;   IR ANGIO EXTERNAL CAROTID SEL EXT CAROTID UNI R MOD SED  09/26/2020   IR ANGIO INTRA EXTRACRAN SEL COM CAROTID INNOMINATE BILAT MOD SED  09/26/2020   IR ANGIO VERTEBRAL SEL VERTEBRAL UNI R MOD SED  09/26/2020   IR IVC FILTER PLMT / S&I /IMG GUID/MOD SED  10/08/2020   IR IVUS EACH ADDITIONAL NON CORONARY VESSEL  11/02/2020   IR PTA VENOUS EXCEPT DIALYSIS CIRCUIT  11/02/2020   IR RADIOLOGIST EVAL & MGMT   01/17/2021   IR THROMBECT VENO MECH MOD SED  10/27/2020   IR THROMBECT VENO MECH MOD SED  11/02/2020   IR US GUIDE VASC ACCESS LEFT  10/27/2020   IR US GUIDE VASC ACCESS RIGHT  09/26/2020   IR US GUIDE VASC ACCESS RIGHT  11/02/2020   IR VENO/EXT/UNI RIGHT  11/02/2020   IR VENOCAVAGRAM IVC  10/27/2020   RETINAL DETACHMENT SURGERY     right knee arthroscopy     TEE WITHOUT CARDIOVERSION N/A 09/27/2013   Procedure: TRANSESOPHAGEAL ECHOCARDIOGRAM (TEE);  Surgeon: Lelon Perla, MD;  Location: [facility] ENDOSCOPY;  Service: Cardiovascular;  Laterality: N/A;   WRIST SURGERY      There were no vitals filed for this visit.   Subjective Assessment - 05/02/21 0732     Subjective  Pt reports he stopped taking topamx    Pertinent History ICH of brainstem (complicated by hydrocephalus, DVTs, and tracheostomy).  PMH:  a-fib    Limitations fall risk, ataxia, impulsivity    Patient Stated Goals use L side of body better (decrease ataxia), stay active and heal brain    Currently in Pain? No/denies                       Treatment:Prone on mat: Lifting chest,  and scapular retraction with shoulder extension, min v.c and facilitation. Tall kneeling on elbows over ball rolling ball forwards and back wardsand rocking forwards and backwards then lifting alternate UE, , min-mod facilitation  Ambulating to locate items, 14/15 located of first pass, pt was more alert today and did not bump into items, min v.c for safety , minguard provided for balance Functional reaching with left and right UE's to place large pegs into  vertical pegboard with right and left UE, min difficulty with RUE, and mod difficulty   due to ataxia               OT Short Term Goals - 05/02/21 0732       OT SHORT TERM GOAL #1   Title Pt/caregiver will be independent with initial HEP for LUE coordination and R hand strength.--    Time 4    Period Weeks    Status Achieved   needs continued reinforcement, pt is not using  consistently   Target Date 03/02/21      OT SHORT TERM GOAL #2   Title Pt/caregiver will be independent with vision HEP and visual compensation strategies.    Time 4    Period Weeks    Status Achieved   Pt/ family verbalize understanding of vision HEP, needs continued reinforcement of compensatory strategeis- pt is not performing head turns     OT SHORT TERM GOAL #3   Title Pt will improve LUE coordination for ADLs as shown by improving score on box and blocks test by at least 6.    Baseline L-21 blocks    Time 4    Period Weeks    Status On-going   04/27/21-26 for LUE increased by 5 blocks, (RUE 32)     OT SHORT TERM GOAL #4   Title Pt will perform simple snack prep/home maintenance task from w/c level mod I.    Time 4    Period Weeks    Status Achieved   not consistent 04/27/21     OT SHORT TERM GOAL #5   Title Pt will verbalize understanding of compensation strategies for sensory deficts for incr safety.    Time 4    Period Weeks    Status Achieved   Pt verbalized that he has to be careful about temperature, needs additional reinforcement.  03/12/21:  met     Additional Short Term Goals   Additional Short Term Goals Yes      OT SHORT TERM GOAL #6   Title Pt will improve R hand grip stength to at least 32lbs to assist with ADLs/opening containers.    Time 4    Period Weeks    Status Achieved   04/27/21-64.4, L 81.8     OT SHORT TERM GOAL #7   Title Pt will consistently navigate a busy environment at a walker level with no more than 2 v.c . for walker safety and avoiding items.    Time 4    Period Weeks    Status New      OT SHORT TERM GOAL #8   Title Pt will perform transitional movements modified independently  without LOB for increased safety with ADLS.    Time 4    Period Weeks    Status New               OT Long Term Goals - 04/30/21 1136       OT LONG TERM GOAL #1   Title Pt will be  independent with updated HEP--    Time 10    Period Weeks     Status On-going      OT LONG TERM GOAL #2   Title Pt will improve L hand coordination for ADLs as shown by completing 9-hole peg test in less than 70sec.    Baseline 107sec    Time 10    Period Weeks    Status On-going   04/27/21 LUE-1 min 39 secs( RUE 35.16)     OT LONG TERM GOAL #3   Title Pt will be mod I with toileting.    Time 10    Period Weeks    Status Partially Met   met per pt report, however supervision recommended at htis time as pt balance appears worse since he took topamx, supervision recommended at this time10/31/22     OT LONG TERM GOAL #4   Title Pt will perform simple snack prep/home maintenance tasks in standing with supervision.    Time 10    Period Weeks    Status On-going   caregivers are performing     OT LONG TERM GOAL #5   Title Pt will demo good safety awareness/compensation for sensory deficits and attention to LUE during functional tasks and transfers without cueing.    Time 10    Period Weeks    Status On-going   04/17/21:  pt with difficulty/bumping into items with RW on L side at times, pt demo impulsivity at times with movement or with distractions with LUE movement (particularly with release of objects)     OT LONG TERM GOAL #6   Title Pt will perform simple environmental scanning/navigation with at least 90% accuracy for incr safety without v.c to avoid bumping items    Time 10    Period Weeks    Status Revised   03/12/21:  70% with CGA for balance/ambulation.  04/17/21:  100% with scanning for objects, but needed close supervision/min cueing for safety/bumping into items on L side     OT LONG TERM GOAL #7   Title Pt will perform all basic ADLS with no more than supervision.    Time 10   Period Weeks    Status On-going   03/12/21:  min A-supervision.  04/17/21:  min A for boot, min A for shower transfer, supervision for tolieting                  Plan - 05/02/21 0727     Clinical Impression Statement Pt demonstrates progress towards  goals, however he has not met all goals due to severity of deficits and recent trial of topamax which appeared to affect his cognition. Pt has stopped taking topamax and his cognition appeared better today. Pt can benefit from skilled occupational therapy to address the following deficits: decreased coordination, decreased balance, cogntive deficits, decreased sensation, decreased functional mobility and visual deficits which impedes performance of ADLS/ IADLs.    OT Occupational Profile and History Detailed Assessment- Review of Records and additional review of physical, cognitive, psychosocial history related to current functional performance    Occupational performance deficits (Please refer to evaluation for details): ADL's;IADL's;Work;Leisure;Social Participation    Body Structure / Function / Physical Skills ADL;Strength;Balance;Proprioception;UE functional use;IADL;Endurance;Vision;Mobility;Coordination;Decreased knowledge of precautions;FMC;GMC;Sensation    Cognitive Skills Attention;Safety Awareness;Memory;Perception;Problem Solve    Rehab Potential Good    Clinical Decision Making Several treatment options, min-mod task modification necessary    Comorbidities Affecting Occupational Performance: May have comorbidities impacting occupational performance  Modification or Assistance to Complete Evaluation  Min-Moderate modification of tasks or assist with assess necessary to complete eval    OT Frequency 2x / week    OT Duration --   10 weeks   OT Treatment/Interventions Self-care/ADL training;Moist Heat;Fluidtherapy;DME and/or AE instruction;Balance training;Therapeutic activities;Aquatic Therapy;Ultrasound;Therapeutic exercise;Cognitive remediation/compensation;Visual/perceptual remediation/compensation;Functional Mobility Training;Neuromuscular education;Cryotherapy;Energy conservation;Manual Therapy;Patient/family education    Plan continues transfers/safety, environmental  scanning/navigation, simple home maintenance    Consulted and Agree with Plan of Care Patient;Family member/caregiver    Family Member Consulted wife [name]             Patient will benefit from skilled therapeutic intervention in order to improve the following deficits and impairments:   Body Structure / Function / Physical Skills: ADL, Strength, Balance, Proprioception, UE functional use, IADL, Endurance, Vision, Mobility, Coordination, Decreased knowledge of precautions, [place], GMC, Sensation Cognitive Skills: Attention, Safety Awareness, Memory, Perception, Problem Solve     Visit Diagnosis: Unsteadiness on feet  Muscle weakness (generalized)  Other lack of coordination  Abnormal posture  Visuospatial deficit  Frontal lobe and executive function deficit  Other disturbances of skin sensation  Other abnormalities of gait and mobility    Problem List Patient Active Problem List   Diagnosis Date Noted   Residual cognitive deficit as late effect of stroke 04/19/2021   Hemiparesis affecting right side as late effect of stroke ([place]) 04/19/2021   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 04/19/2021   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 ([place]) 04/19/2021   Thrombosis    Sleep disturbance    Dysphagia, post-stroke    PAF (paroxysmal atrial fibrillation) ([facility])    Acute pulmonary embolism without acute cor pulmonale (H[facility])    Malnutrition of moderate degree 11/04/2020   Tracheostomy care ([place])    Pressure injury of skin 10/23/2020   Intracranial hemorrhage ([facility])    Atrial fibrillation ([facility])    Prediabetes    Leukocytosis    Acute blood loss anemia    Hypernatremia    ICH (intracerebral hemorrhage) ([place]) 09/25/2020   Unilateral primary osteoarthritis, left knee 09/26/2016   Atrial flutter ([place]) 09/25/2013   Near syncope 09/25/2013    Johnette Teigen, OT/L 05/02/2021, 7:39 AM  [facility] [address] [place], [place], [address] Phone: [phone]   Fax:  [phone]  Name: PRESLEY SUMMERLIN MRN: [id number] Date of Birth: 22-Apr-1956

## 2021-05-02 NOTE — Addendum Note (Signed)
Addended by: Keene Breath B on: 05/02/2021 07:45 AM   Modules accepted: Orders

## 2021-05-04 ENCOUNTER — Telehealth: Payer: Self-pay

## 2021-05-04 NOTE — Telephone Encounter (Signed)
-----   Message from Micki Riley, MD sent at 05/03/2021  5:11 PM EDT ----- Joneen Roach inform the patient that follow-up MRI scan of the brain shows no significant changes compared to the previous MRI in June.  No new or worrisome finding.  Nothing to worry about ----- Message ----- From: Asa Lente, MD Sent: 04/30/2021   9:31 PM EDT To: Micki Riley, MD

## 2021-05-04 NOTE — Telephone Encounter (Signed)
Mychart message sent.

## 2021-05-08 ENCOUNTER — Other Ambulatory Visit: Payer: Self-pay

## 2021-05-08 ENCOUNTER — Ambulatory Visit: Payer: Medicare Other | Attending: Family Medicine

## 2021-05-08 ENCOUNTER — Encounter: Payer: Self-pay | Admitting: Occupational Therapy

## 2021-05-08 ENCOUNTER — Ambulatory Visit: Payer: Medicare Other | Admitting: Occupational Therapy

## 2021-05-08 DIAGNOSIS — M6281 Muscle weakness (generalized): Secondary | ICD-10-CM

## 2021-05-08 DIAGNOSIS — R293 Abnormal posture: Secondary | ICD-10-CM

## 2021-05-08 DIAGNOSIS — R41841 Cognitive communication deficit: Secondary | ICD-10-CM | POA: Diagnosis present

## 2021-05-08 DIAGNOSIS — R41844 Frontal lobe and executive function deficit: Secondary | ICD-10-CM | POA: Diagnosis present

## 2021-05-08 DIAGNOSIS — G8929 Other chronic pain: Secondary | ICD-10-CM | POA: Diagnosis present

## 2021-05-08 DIAGNOSIS — R2681 Unsteadiness on feet: Secondary | ICD-10-CM

## 2021-05-08 DIAGNOSIS — R270 Ataxia, unspecified: Secondary | ICD-10-CM

## 2021-05-08 DIAGNOSIS — R2689 Other abnormalities of gait and mobility: Secondary | ICD-10-CM | POA: Diagnosis present

## 2021-05-08 DIAGNOSIS — R41842 Visuospatial deficit: Secondary | ICD-10-CM

## 2021-05-08 DIAGNOSIS — R278 Other lack of coordination: Secondary | ICD-10-CM

## 2021-05-08 DIAGNOSIS — R262 Difficulty in walking, not elsewhere classified: Secondary | ICD-10-CM | POA: Diagnosis present

## 2021-05-08 DIAGNOSIS — M25561 Pain in right knee: Secondary | ICD-10-CM | POA: Diagnosis present

## 2021-05-08 NOTE — Therapy (Signed)
[facility] [address] [place], [place], [address] Phone: [phone]   Fax:  [phone]  Occupational Therapy Treatment  Patient Details  Name: Walter [name] MRN: [id number] Date of Birth: [birth year] Referring Provider (OT): Dr. Alysia Penna   Encounter Date: 05/08/2021   OT End of Session - 05/08/21 1407     Visit Number 34    Number of Visits 75    Date for OT Re-Evaluation 07/09/20    Authorization - Visit Number 54    Authorization - Number of Visits 40    Progress Note Due on Visit 81    OT Start Time 1315    OT Stop Time 1400    OT Time Calculation (min) 45 min    Activity Tolerance Patient tolerated treatment well    Behavior During Therapy [facility] for tasks assessed/performed             Past Medical History:  Diagnosis Date   Atrial flutter ([place])    s/p ablation   DVT (deep venous thrombosis) ([place])    Near syncope 09/25/2013   Paroxysmal atrial fibrillation ([place])    Stroke [facility])    initially hemorragic (not on [place]) followed by subsequent embolic stroke    Past Surgical History:  Procedure Laterality Date   A-FLUTTER ABLATION N/A 03/09/2020   Procedure: A-FLUTTER ABLATION;  Surgeon: Thompson Grayer, MD;  Location: [place] [facility];  Service: Cardiovascular;  Laterality: N/A;   CARDIOVERSION N/A 09/27/2013   Procedure: CARDIOVERSION;  Surgeon: Lelon Perla, MD;  Location: [facility] ENDOSCOPY;  Service: Cardiovascular;  Laterality: N/A;   IR ANGIO EXTERNAL CAROTID SEL EXT CAROTID UNI R MOD SED  09/26/2020   IR ANGIO INTRA EXTRACRAN SEL COM CAROTID INNOMINATE BILAT MOD SED  09/26/2020   IR ANGIO VERTEBRAL SEL VERTEBRAL UNI R MOD SED  09/26/2020   IR IVC FILTER PLMT / S&I /IMG GUID/MOD SED  10/08/2020   IR IVUS EACH ADDITIONAL NON CORONARY VESSEL  11/02/2020   IR PTA VENOUS EXCEPT DIALYSIS CIRCUIT  11/02/2020   IR RADIOLOGIST EVAL & MGMT  01/17/2021   IR THROMBECT VENO MECH MOD SED  10/27/2020   IR  THROMBECT VENO MECH MOD SED  11/02/2020   IR US GUIDE VASC ACCESS LEFT  10/27/2020   IR US GUIDE VASC ACCESS RIGHT  09/26/2020   IR US GUIDE VASC ACCESS RIGHT  11/02/2020   IR VENO/EXT/UNI RIGHT  11/02/2020   IR VENOCAVAGRAM IVC  10/27/2020   RETINAL DETACHMENT SURGERY     right knee arthroscopy     TEE WITHOUT CARDIOVERSION N/A 09/27/2013   Procedure: TRANSESOPHAGEAL ECHOCARDIOGRAM (TEE);  Surgeon: Lelon Perla, MD;  Location: [facility] ENDOSCOPY;  Service: Cardiovascular;  Laterality: N/A;   WRIST SURGERY      There were no vitals filed for this visit.   Subjective Assessment - 05/08/21 1403     Subjective  I think I am seeing her on the 30th - regarding Neuro-optometry    Pertinent History ICH of brainstem (complicated by hydrocephalus, DVTs, and tracheostomy).  PMH:  a-fib    Limitations fall risk, ataxia, impulsivity    Patient Stated Goals use L side of body better (decrease ataxia), stay active and heal brain    Currently in Pain? No/denies    Pain Score 0-No pain                          OT Treatments/Exercises (OP) -  05/08/21 0001       ADLs   Functional Mobility Working on safety and mechanics of sit to stand and stand to sit.  Patient with tendency to lean backward with transitions to stand, and he locks his left leg.  Patient able to keep left leg active but not locked intermittently with cueing and facilitation. Worked on functional transfers from low surfaces.  Emphasis on forward weight shift onto feet.                      OT Short Term Goals - 05/08/21 1408       OT SHORT TERM GOAL #1   Title Pt/caregiver will be independent with initial HEP for LUE coordination and R hand strength.--    Time 4    Period Weeks    Status Achieved   needs continued reinforcement, pt is not using consistently   Target Date 03/02/21      OT SHORT TERM GOAL #2   Title Pt/caregiver will be independent with vision HEP and visual compensation strategies.    Time 4     Period Weeks    Status Achieved   Pt/ family verbalize understanding of vision HEP, needs continued reinforcement of compensatory strategeis- pt is not performing head turns     OT SHORT TERM GOAL #3   Title Pt will improve LUE coordination for ADLs as shown by improving score on box and blocks test by at least 6.    Baseline L-21 blocks    Time 4    Period Weeks    Status On-going   04/27/21-26 for LUE increased by 5 blocks, (RUE 32)     OT SHORT TERM GOAL #4   Title Pt will perform simple snack prep/home maintenance task from w/c level mod I.    Time 4    Period Weeks    Status Achieved   not consistent 04/27/21     OT SHORT TERM GOAL #5   Title Pt will verbalize understanding of compensation strategies for sensory deficts for incr safety.    Time 4    Period Weeks    Status Achieved   Pt verbalized that he has to be careful about temperature, needs additional reinforcement.  03/12/21:  met     OT SHORT TERM GOAL #6   Title Pt will improve R hand grip stength to at least 32lbs to assist with ADLs/opening containers.    Time 4    Period Weeks    Status Achieved   04/27/21-64.4, L 81.8     OT SHORT TERM GOAL #7   Title Pt will consistently navigate a busy environment at a walker level with no more than 2 v.c . for walker safety and avoiding items.    Time 4    Period Weeks    Status New      OT SHORT TERM GOAL #8   Title Pt will perform transitional movements modified independently  without LOB for increased safety with ADLS.    Time 4    Period Weeks    Status New               OT Long Term Goals - 05/08/21 1408       OT LONG TERM GOAL #1   Title Pt will be independent with updated HEP--    Time 10    Period Weeks    Status On-going      OT LONG TERM GOAL #2  Title Pt will improve L hand coordination for ADLs as shown by completing 9-hole peg test in less than 70sec.    Baseline 107sec    Time 10    Period Weeks    Status On-going   04/27/21 LUE-1 min  39 secs( RUE 35.16)     OT LONG TERM GOAL #3   Title Pt will be mod I with toileting.    Time 10    Period Weeks    Status Partially Met   met per pt report, however supervision recommended at htis time as pt balance appears worse since he took topamx, supervision recommended at this time10/31/22     OT LONG TERM GOAL #4   Title Pt will perform simple snack prep/home maintenance tasks in standing with supervision.    Time 10    Period Weeks    Status On-going   caregivers are performing     OT LONG TERM GOAL #5   Title Pt will demo good safety awareness/compensation for sensory deficits and attention to LUE during functional tasks and transfers without cueing.    Time 10    Period Weeks    Status On-going   04/17/21:  pt with difficulty/bumping into items with RW on L side at times, pt demo impulsivity at times with movement or with distractions with LUE movement (particularly with release of objects)     OT LONG TERM GOAL #6   Title Pt will perform simple environmental scanning/navigation with at least 90% accuracy for incr safety without v.c to avoid bumping items    Time 10    Period Weeks    Status Revised   03/12/21:  70% with CGA for balance/ambulation.  04/17/21:  100% with scanning for objects, but needed close supervision/min cueing for safety/bumping into items on L side     OT LONG TERM GOAL #7   Title Pt will perform all basic ADLS with no more than supervision.    Time 10    Period Weeks    Status On-going   03/12/21:  min A-supervision.  04/17/21:  min A for boot, min A for shower transfer, supervision for tolieting                  Plan - 05/08/21 1407     Clinical Impression Statement Pt eager to see neuro-optometry to address vision.  Patient reports wanting to improve balance and ataxia    OT Occupational Profile and History Detailed Assessment- Review of Records and additional review of physical, cognitive, psychosocial history related to current  functional performance    Occupational performance deficits (Please refer to evaluation for details): ADL's;IADL's;Work;Leisure;Social Participation    Body Structure / Function / Physical Skills ADL;Strength;Balance;Proprioception;UE functional use;IADL;Endurance;Vision;Mobility;Coordination;Decreased knowledge of precautions;FMC;GMC;Sensation    Cognitive Skills Attention;Safety Awareness;Memory;Perception;Problem Solve    Rehab Potential Good    Clinical Decision Making Several treatment options, min-mod task modification necessary    Comorbidities Affecting Occupational Performance: May have comorbidities impacting occupational performance    Modification or Assistance to Complete Evaluation  Min-Moderate modification of tasks or assist with assess necessary to complete eval    OT Frequency 2x / week    OT Duration --   10 weeks   OT Treatment/Interventions Self-care/ADL training;Moist Heat;Fluidtherapy;DME and/or AE instruction;Balance training;Therapeutic activities;Aquatic Therapy;Ultrasound;Therapeutic exercise;Cognitive remediation/compensation;Visual/perceptual remediation/compensation;Functional Mobility Training;Neuromuscular education;Cryotherapy;Energy conservation;Manual Therapy;Patient/family education    Plan continues transfers/safety, environmental scanning/navigation, simple home maintenance    Consulted and Agree with Plan of Care Patient;Family member/caregiver  Family Member Consulted wife [name]             Patient will benefit from skilled therapeutic intervention in order to improve the following deficits and impairments:   Body Structure / Function / Physical Skills: ADL, Strength, Balance, Proprioception, UE functional use, IADL, Endurance, Vision, Mobility, Coordination, Decreased knowledge of precautions, [place], GMC, Sensation Cognitive Skills: Attention, Safety Awareness, Memory, Perception, Problem Solve     Visit Diagnosis: Abnormal posture  Chronic pain  of right knee  Muscle weakness (generalized)  Unsteadiness on feet  Ataxia  Other lack of coordination  Visuospatial deficit    Problem List Patient Active Problem List   Diagnosis Date Noted   Residual cognitive deficit as late effect of stroke 04/19/2021   Hemiparesis affecting right side as late effect of stroke ([place]) 04/19/2021   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 04/19/2021   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 ([place]) 04/19/2021   Thrombosis    Sleep disturbance    Dysphagia, post-stroke    PAF (paroxysmal atrial fibrillation) ([facility])    Acute pulmonary embolism without acute cor pulmonale (HCC)    Malnutrition of moderate degree 11/04/2020   Tracheostomy care ([place])    Pressure injury of skin 10/23/2020   Intracranial hemorrhage (H[facility])    Atrial fibrillation (H[facility])    Prediabetes    Leukocytosis    Acute blood loss anemia    Hypernatremia    ICH (intracerebral hemorrhage) ([place]) 09/25/2020   Unilateral primary osteoarthritis, left knee 09/26/2016   Atrial flutter ([place]) 09/25/2013   Near syncope 09/25/2013    Mariah Milling, OT/L 05/08/2021, 2:09 PM  [facility] [address] [place], [place], [address] Phone: [phone]   Fax:  [phone]  Name: Walter [name] MRN: [id number] Date of Birth: 1955-10-10

## 2021-05-08 NOTE — Therapy (Signed)
OUTPATIENT PHYSICAL THERAPY TREATMENT NOTE/PROGRESS NOTE Patient Name: Walter [name] MRN: [id number] DOB:Jul 24, 1955, 65 y.o., male Today's Date: 05/08/2021  PCP: Alroy Dust, L.Marlou Sa, MD REFERRING PROVIDER: Alroy Dust, Carlean Jews.Marlou Sa, MD         PT End of Session - 05/08/21 1233     Visit Number 39    Number of Visits 76    Date for PT Re-Evaluation 07/02/21    Authorization Type MCR    Progress Note Due on Visit 72    PT Start Time 1230    PT Stop Time 1315    PT Time Calculation (min) 45 min    Activity Tolerance Patient tolerated treatment well    Behavior During Therapy WFL for tasks assessed/performed                      Past Medical History:  Diagnosis Date   Atrial flutter ([place])    s/p ablation   DVT (deep venous thrombosis) (HCC)    Near syncope 09/25/2013   Paroxysmal atrial fibrillation (HCC)    Stroke [facility])    initially hemorragic (not on [place]) followed by subsequent embolic stroke   Past Surgical History:  Procedure Laterality Date   A-FLUTTER ABLATION N/A 03/09/2020   Procedure: A-FLUTTER ABLATION;  Surgeon: Thompson Grayer, MD;  Location: [place] CV LAB;  Service: Cardiovascular;  Laterality: N/A;   CARDIOVERSION N/A 09/27/2013   Procedure: CARDIOVERSION;  Surgeon: Lelon Perla, MD;  Location: [facility] ENDOSCOPY;  Service: Cardiovascular;  Laterality: N/A;   IR ANGIO EXTERNAL CAROTID SEL EXT CAROTID UNI R MOD SED  09/26/2020   IR ANGIO INTRA EXTRACRAN SEL COM CAROTID INNOMINATE BILAT MOD SED  09/26/2020   IR ANGIO VERTEBRAL SEL VERTEBRAL UNI R MOD SED  09/26/2020   IR IVC FILTER PLMT / S&I /IMG GUID/MOD SED  10/08/2020   IR IVUS EACH ADDITIONAL NON CORONARY VESSEL  11/02/2020   IR PTA VENOUS EXCEPT DIALYSIS CIRCUIT  11/02/2020   IR RADIOLOGIST EVAL & MGMT  01/17/2021   IR THROMBECT VENO MECH MOD SED  10/27/2020   IR THROMBECT VENO MECH MOD SED  11/02/2020   IR US GUIDE VASC ACCESS LEFT  10/27/2020   IR US GUIDE VASC ACCESS RIGHT  09/26/2020   IR US GUIDE VASC  ACCESS RIGHT  11/02/2020   IR VENO/EXT/UNI RIGHT  11/02/2020   IR VENOCAVAGRAM IVC  10/27/2020   RETINAL DETACHMENT SURGERY     right knee arthroscopy     TEE WITHOUT CARDIOVERSION N/A 09/27/2013   Procedure: TRANSESOPHAGEAL ECHOCARDIOGRAM (TEE);  Surgeon: Lelon Perla, MD;  Location: [facility] ENDOSCOPY;  Service: Cardiovascular;  Laterality: N/A;   WRIST SURGERY     Patient Active Problem List   Diagnosis Date Noted   Residual cognitive deficit as late effect of stroke 04/19/2021   Hemiparesis affecting right side as late effect of stroke ([place]) 04/19/2021   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 04/19/2021   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 ([place]) 04/19/2021   Thrombosis    Sleep disturbance    Dysphagia, post-stroke    PAF (paroxysmal atrial fibrillation) ([place])    Acute pulmonary embolism without acute cor pulmonale (HCC)    Malnutrition of moderate degree 11/04/2020   Tracheostomy care ([place])    Pressure injury of skin 10/23/2020   Intracranial hemorrhage (HCC)    Atrial fibrillation (HCC)    Prediabetes    Leukocytosis    Acute blood loss anemia    Hypernatremia  ICH (intracerebral hemorrhage) ([place]) 09/25/2020   Unilateral primary osteoarthritis, left knee 09/26/2016   Atrial flutter ([place]) 09/25/2013   Near syncope 09/25/2013  See Note below for Objective Data and Assessment of Progress/Goals.  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   TODAY'S TREATMENT:  05/08/21  114f ambulation with SPC weighted with 3#, 6# on LLE, 1 trip in each direction to perform R/L turning  In // bars stepping on 6"  block  and raising arms OH, 10x each side with eight shifting onto step  1 minor LOB noted on each ambulation attempt with patient able to self correct given the opportunity  Extra time required during ambulation to allow for proper sequencing, cadence and balance correction   05/02/21 Pt seen for aquatic therapy today.  Treatment took place in water 3.25-4.8 ft in [facility] Temp of water was 92.Pt entered/exited the pool via stairs step to pattern independently with bilat rail.                           Warm up: in lap pool pt swam crawl stroke 1.5 laps with sup   Standing -step ups 70% submerged x15 R/L -side stepping onto water step R/L x 15. Cues wofr weight shift and maintaining balance/control. Slow pace. -step over (step) vc slowing pace x 10 -High Knee Balance with Backward Walking with Hand Floats R/L -High Knee Balance with Forward Walking and Hand Floats R/L   Pt requires buoyancy for support and to offload joints with strengthening exercises. Viscosity of the water is needed for resistance of strengthening; water current perturbations provides challenge to standing balance unsupported, requiring increased core activation.    04/30/21 Seated placing and retrieving 4KG ball from floor to waist level 15x  Lifting 4 KG ball from waist level to OH 15x  Moving 4 KG ball from hip to hip 15x  STS with OH reaching against black t-band resistance Scifit seat 18, L5, arms 7 x 8 min  Ambulation in // bars, 5 trips fwd and retro, 5 trips sidestepping In // bars, stepping and WS onto 6" block 10x per leg   04/27/21  Seated placing and retrieving 4KG ball from floor to waist level 15x  Lifting 4 KG ball from waist level to OH 15x  Moving 4 KG ball from hip to hip 15x  STS with OH reaching against black t-band resistance Scifit seat 18, L4, arms 7 x 8 min  BERG score 32/56  04/24/21  Ambulation of 1135fwith 6# on LLE and 3# on cane, Min/ModA needed at  times due to ataxia and possible lethargy from new meds, several LOB episodes noted requiring PT assist to prevent falls  Seated core exercises of hip/shoulder tosses, chops and Vs with 6# ball 10x each task  Sit to stand using green t-band for resisted OH reach once standing  Seated forward reaching to floor to retrieve 4KG ball and reach OHVision Care Of Mainearoostook LLCith it, 10x        04/13/21 Pt seen for aquatic therapy today.  Treatment took place in water 3.25-4.8 ft in [facility] Temp of water was  92.Pt entered/exited the pool via stairs step to pattern independently with bilat rail.                           Amb progressed to  with and without hand buoys forward, backward (posterior chain activation) and side stepping/walking cues for increased step length, posture and weight shifting x 4 lengths of pool. Pt requiring min assist with backward amb.   Standing -hip add/abd and extension x 15 reps holding to pool wall. VC for increases speed to increase resistance.   -hip extension 2 x 10 -step ups 70% submerged x15 R/L -step over (step) vc for placement and control of le/core x 10  Gait training 50% submerged cues for direct path, control of le/placement of feet.     Supine suspension Standing<>supine using 2 foam hand buoys x5 reps holding position x 10 second each. Vc and demo with min TC to gain position -in above position knees to chest with assist from therapist to maintain position 3 x 10 core strengthening   Aerobic capacity Swimming crawl and breast stroke 6 lengths of pool sba continuously.  VC and TC for straight path   Pt requires buoyancy for support and to offload joints with strengthening exercises. Viscosity of the water is needed for resistance of strengthening; water current perturbations provides challenge to standing balance unsupported, requiring increased core activation.     04/11/21  Seated on blue disk, forward reaching to floor wih 4KG ball to lap and  repeated w/o ball to vary resistance levels and have patient adapt  Searwd on blue disk, lifting 4KG ball OH and repeating w/o ball to challenge core and adapt to varied resistance challenges   Seated LAQs on disk, 6# wts on L ankle, boot on R  with lat press alternating pattern, 1x15  Seated marching on disk, 6# wts on L ankle, boot on R, with lat press alternating pattern 1x15  15x STS performing OH press against red band once standing, CGA needed for stability  141f ambulation with 3# weighted cane, 6# on L ankle, requiring Min/ModA to prevent fall  1132fambulaton with 6# weighted L ankle and RW with Min/ModA needed to stabilize     PATIENT EDUCATION: Education details: Scheduling more PT visits for October Person educated: Patient and caregiver Education method: ExConsulting civil engineerDeMedia plannerand Verbal cues Education comprehension: verbalized understanding     HOME EXERCISE PROGRAM: House ambulation with Cgs in RW/Consider membership to a pool.  ASSESSMENT:   CLINICAL IMPRESSION: Ability to ambulate with weighted cane and LLE has greatly improved as patient is much better at pacing and sequencing.  He is able to self correct and LOB  and recognizes balance loss.  Advanced to staggered stance in // bars adding OH reaching to challenge balance    REHAB POTENTIAL: Good   CLINICAL DECISION MAKING: Stable/uncomplicated  GOALS: Goals reviewed with patient? Yes   SHORT TERM GOALS:   STG Name Target Date Goal status  1 Patient will be able to perform chair to chair transfer with CGA/SBA with use of RW for safety to improve independence. Baseline: min to mod A (12/26/20); CGA/minA (03/09/21) 01/23/2021 03/27/21 Able to transfer STS and stand pivot with SBA, goal met  2 Pt will be able to ambulate 115' with RW with CGA to min A with RW for safety to improve independence Baseline: min to mod A with RW 145'; 230 feet with RW and CGA to MIN A  01/23/2021 03/09/21 Goal met  3 Pt will be able to  maintain standing balance for 30 sec to improve static balance Baseline: 10 sec before needing help due to LOB (12/26/20) 01/23/2021 02/05/21 able to stand with S for 30s, goal met  4. Pt will be able to perform sit to stand transfers with SBA to improve independence 04/06/21 Goal met 03/27/21  5. Pt will be able to perform 230 feet of walking with RW with SBA/CGA to improve household ambulation  04/06/21 Revised 03/09/21 04/03/21 UTA due to achilles partial tear    LONG TERM GOALS:   LTG Name Target Date Goal status  1 Pt will demo >5 points improvement on Berg Balance Scale to improve static balance Baseline: 11/56 01/03/21 05/28/2021 INITIAL 03/09/21 BBS 21/56 04/03/21 UTA due to achilles partial tear  2 Pt will be able to ambulate >500 feet with RW and SBA to improve walking endurance and safety. Baseline: 145' with RW min to mod A; 230 feet with CGA to Min A (03/09/21)  05/28/21 04/24/21 138f with weighted cane and LLE needing CGA/MinA  3 Pt  will be able to perform gait speed improvement by 0.161m to improve community ambulation. Baseline:0.2666m(03/09/21) 05/28/21 Progressing continue 04/03/21 UTA due to achilles partial tear  4 Pt will demo 5 sec improvement in 5x sit to stand (with or without UE support) to improve functional strength Baseline: 5x sit to stand with bil UE 17 sec;  05/28/21 Progressing continue 04/03/21 Assessed at 23.5s, performance limited by R walking boot    PLAN: PT FREQUENCY: 2-3x/week   PT DURATION: 8 weeks   PLANNED INTERVENTIONS: Aquatic therapy, Therapeutic exercises, Therapeutic activity, Neuro Muscular re-education, Balance training, Gait training, Patient/Family education, Joint mobilization, Stair training, Visual/preceptual remediation/compensation, Wheelchair mobility training, Cryotherapy, Moist heat, and Manual therapy   PLAN FOR NEXT SESSION:  Continue core and balance strengthening.  Review transfer safety and proper technique, ambulation with weighted cane  and LLE as appropriate, // bars ambulation and stepping tasks, balance challenges in staggered stance positions  Visit Diagnosis: No diagnosis found.     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 04/19/2021   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 ([place]CCPer[place]0/20/2022   Thrombosis    Sleep disturbance    Dysphagia, post-stroke    PAF (paroxysmal atrial fibrillation) ([place]  Acute pulmonary embolism without acute cor pulmonale (HCC)    Malnutrition of moderate degree 11/04/2020   Tracheostomy care ([place]CC[place]  Pressure injury of skin 10/23/2020   Intracranial hemorrhage (HCC)    Atrial fibrillation ([place]CC[place]  Prediabetes    Leukocytosis    Acute blood loss anemia    Hypernatremia    ICH (intracerebral hemorrhage) (HCCToy[place]3/28/2022   Unilateral primary osteoarthritis, left knee 09/26/2016   Atrial flutter ([place]CC[place] Terr[place]3/28/2015   Near syncope 09/25/2013    JefLanice ShirtsT 05/08/2021, 12:34 PM  [address] des MortseLynn Haven[place]one: [phone]:  [phone]

## 2021-05-11 ENCOUNTER — Ambulatory Visit: Payer: Medicare Other | Admitting: Occupational Therapy

## 2021-05-11 ENCOUNTER — Other Ambulatory Visit: Payer: Self-pay

## 2021-05-11 ENCOUNTER — Ambulatory Visit: Payer: Medicare Other

## 2021-05-11 DIAGNOSIS — M6281 Muscle weakness (generalized): Secondary | ICD-10-CM

## 2021-05-11 DIAGNOSIS — R293 Abnormal posture: Secondary | ICD-10-CM | POA: Diagnosis not present

## 2021-05-11 DIAGNOSIS — R41842 Visuospatial deficit: Secondary | ICD-10-CM

## 2021-05-11 DIAGNOSIS — R270 Ataxia, unspecified: Secondary | ICD-10-CM

## 2021-05-11 DIAGNOSIS — R2681 Unsteadiness on feet: Secondary | ICD-10-CM

## 2021-05-11 DIAGNOSIS — R278 Other lack of coordination: Secondary | ICD-10-CM

## 2021-05-11 NOTE — Therapy (Signed)
[facility] [address] [place], [place], [address] Phone: [phone]   Fax:  [phone]  Occupational Therapy Treatment  Patient Details  Name: Walter [name] MRN: [id number] Date of Birth: 1956/04/03 Referring Provider (OT): Dr. Alysia Penna   Encounter Date: 05/11/2021   OT End of Session - 05/11/21 1049     Visit Number 35    Number of Visits 30    Date for OT Re-Evaluation 07/09/20    Authorization Type Medicare Part A & B **need to add KX**    Authorization - Visit Number 63    Authorization - Number of Visits 40    Progress Note Due on Visit 64    OT Start Time 1021    OT Stop Time 1100    OT Time Calculation (min) 39 min             Past Medical History:  Diagnosis Date   Atrial flutter ([place])    s/p ablation   DVT (deep venous thrombosis) ([place])    Near syncope 09/25/2013   Paroxysmal atrial fibrillation ([place])    Stroke [facility])    initially hemorragic (not on [place]) followed by subsequent embolic stroke    Past Surgical History:  Procedure Laterality Date   A-FLUTTER ABLATION N/A 03/09/2020   Procedure: A-FLUTTER ABLATION;  Surgeon: Thompson Grayer, MD;  Location: [facility];  Service: Cardiovascular;  Laterality: N/A;   CARDIOVERSION N/A 09/27/2013   Procedure: CARDIOVERSION;  Surgeon: Lelon Perla, MD;  Location: [facility] ENDOSCOPY;  Service: Cardiovascular;  Laterality: N/A;   IR ANGIO EXTERNAL CAROTID SEL EXT CAROTID UNI R MOD SED  09/26/2020   IR ANGIO INTRA EXTRACRAN SEL COM CAROTID INNOMINATE BILAT MOD SED  09/26/2020   IR ANGIO VERTEBRAL SEL VERTEBRAL UNI R MOD SED  09/26/2020   IR IVC FILTER PLMT / S&I /IMG GUID/MOD SED  10/08/2020   IR IVUS EACH ADDITIONAL NON CORONARY VESSEL  11/02/2020   IR PTA VENOUS EXCEPT DIALYSIS CIRCUIT  11/02/2020   IR RADIOLOGIST EVAL & MGMT  01/17/2021   IR THROMBECT VENO MECH MOD SED  10/27/2020   IR THROMBECT VENO MECH MOD SED  11/02/2020   IR US GUIDE VASC  ACCESS LEFT  10/27/2020   IR US GUIDE VASC ACCESS RIGHT  09/26/2020   IR US GUIDE VASC ACCESS RIGHT  11/02/2020   IR VENO/EXT/UNI RIGHT  11/02/2020   IR VENOCAVAGRAM IVC  10/27/2020   RETINAL DETACHMENT SURGERY     right knee arthroscopy     TEE WITHOUT CARDIOVERSION N/A 09/27/2013   Procedure: TRANSESOPHAGEAL ECHOCARDIOGRAM (TEE);  Surgeon: Lelon Perla, MD;  Location: [facility] ENDOSCOPY;  Service: Cardiovascular;  Laterality: N/A;   WRIST SURGERY      There were no vitals filed for this visit.   Subjective Assessment - 05/11/21 1049     Subjective  I think I am seeing her on the 30th - regarding Neuro-optometry    Pertinent History ICH of brainstem (complicated by hydrocephalus, DVTs, and tracheostomy).  PMH:  a-fib    Patient Stated Goals use L side of body better (decrease ataxia), stay active and heal brain    Currently in Pain? No/denies                    Treatment: discussed with pt upcoming optometry appt and current visual deficits. Pt demonstrates continued vertical diplopia at an angle.  Pt demonstrated decreased smoothness of tracking , R eye  does not track right when both eyes are open, deceased alignment Environmental scanning in a minimally busy environment with 100% accuracy, no verbal cues to avoid bumping items Arm bike x 6 mins level 5 for conditioning,  Functional reaching with left and right UE's to place washers on targets with left and right UE's min difficulty, improved overall performance. Pt reports he is going to [place] for rehab in January                OT Short Term Goals - 05/08/21 1408       OT SHORT TERM GOAL #1   Title Pt/caregiver will be independent with initial HEP for LUE coordination and R hand strength.--    Time 4    Period Weeks    Status Achieved   needs continued reinforcement, pt is not using consistently   Target Date 03/02/21      OT SHORT TERM GOAL #2   Title Pt/caregiver will be independent with vision HEP and  visual compensation strategies.    Time 4    Period Weeks    Status Achieved   Pt/ family verbalize understanding of vision HEP, needs continued reinforcement of compensatory strategeis- pt is not performing head turns     OT SHORT TERM GOAL #3   Title Pt will improve LUE coordination for ADLs as shown by improving score on box and blocks test by at least 6.    Baseline L-21 blocks    Time 4    Period Weeks    Status On-going   04/27/21-26 for LUE increased by 5 blocks, (RUE 32)     OT SHORT TERM GOAL #4   Title Pt will perform simple snack prep/home maintenance task from w/c level mod I.    Time 4    Period Weeks    Status Achieved   not consistent 04/27/21     OT SHORT TERM GOAL #5   Title Pt will verbalize understanding of compensation strategies for sensory deficts for incr safety.    Time 4    Period Weeks    Status Achieved   Pt verbalized that he has to be careful about temperature, needs additional reinforcement.  03/12/21:  met     OT SHORT TERM GOAL #6   Title Pt will improve R hand grip stength to at least 32lbs to assist with ADLs/opening containers.    Time 4    Period Weeks    Status Achieved   04/27/21-64.4, L 81.8     OT SHORT TERM GOAL #7   Title Pt will consistently navigate a busy environment at a walker level with no more than 2 v.c . for walker safety and avoiding items.    Time 4    Period Weeks    Status New      OT SHORT TERM GOAL #8   Title Pt will perform transitional movements modified independently  without LOB for increased safety with ADLS.    Time 4    Period Weeks    Status New               OT Long Term Goals - 05/08/21 1408       OT LONG TERM GOAL #1   Title Pt will be independent with updated HEP--    Time 10    Period Weeks    Status On-going      OT LONG TERM GOAL #2   Title Pt will improve L hand coordination for  ADLs as shown by completing 9-hole peg test in less than 70sec.    Baseline 107sec    Time 10    Period  Weeks    Status On-going   04/27/21 LUE-1 min 39 secs( RUE 35.16)     OT LONG TERM GOAL #3   Title Pt will be mod I with toileting.    Time 10    Period Weeks    Status Partially Met   met per pt report, however supervision recommended at htis time as pt balance appears worse since he took topamx, supervision recommended at this time10/31/22     OT LONG TERM GOAL #4   Title Pt will perform simple snack prep/home maintenance tasks in standing with supervision.    Time 10    Period Weeks    Status On-going   caregivers are performing     OT LONG TERM GOAL #5   Title Pt will demo good safety awareness/compensation for sensory deficits and attention to LUE during functional tasks and transfers without cueing.    Time 10    Period Weeks    Status On-going   04/17/21:  pt with difficulty/bumping into items with RW on L side at times, pt demo impulsivity at times with movement or with distractions with LUE movement (particularly with release of objects)     OT LONG TERM GOAL #6   Title Pt will perform simple environmental scanning/navigation with at least 90% accuracy for incr safety without v.c to avoid bumping items    Time 10    Period Weeks    Status Revised   03/12/21:  70% with CGA for balance/ambulation.  04/17/21:  100% with scanning for objects, but needed close supervision/min cueing for safety/bumping into items on L side     OT LONG TERM GOAL #7   Title Pt will perform all basic ADLS with no more than supervision.    Time 10    Period Weeks    Status On-going   03/12/21:  min A-supervision.  04/17/21:  min A for boot, min A for shower transfer, supervision for tolieting                  Plan - 05/11/21 1050     Clinical Impression Statement Pt is progressing slowly towards goals. He remains limited by balance, ataxia, and visual deficits.    OT Occupational Profile and History Detailed Assessment- Review of Records and additional review of physical, cognitive,  psychosocial history related to current functional performance    Occupational performance deficits (Please refer to evaluation for details): ADL's;IADL's;Work;Leisure;Social Participation    Body Structure / Function / Physical Skills ADL;Strength;Balance;Proprioception;UE functional use;IADL;Endurance;Vision;Mobility;Coordination;Decreased knowledge of precautions;FMC;GMC;Sensation    Cognitive Skills Attention;Safety Awareness;Memory;Perception;Problem Solve    Rehab Potential Good    Clinical Decision Making Several treatment options, min-mod task modification necessary    Comorbidities Affecting Occupational Performance: May have comorbidities impacting occupational performance    Modification or Assistance to Complete Evaluation  Min-Moderate modification of tasks or assist with assess necessary to complete eval    OT Frequency 2x / week    OT Duration --   10 weeks   OT Treatment/Interventions Self-care/ADL training;Moist Heat;Fluidtherapy;DME and/or AE instruction;Balance training;Therapeutic activities;Aquatic Therapy;Ultrasound;Therapeutic exercise;Cognitive remediation/compensation;Visual/perceptual remediation/compensation;Functional Mobility Training;Neuromuscular education;Cryotherapy;Energy conservation;Manual Therapy;Patient/family education    Plan continues transfers/safety, environmental scanning/navigation, simple home maintenance- discussed with pt decreasing OT frequency to 1x week, pt to discuss with wife.    Recommended Other Services Pt reports he sees  neuro optometry on 05/30/21    Consulted and Agree with Plan of Care Patient;Family member/caregiver    Family Member Consulted wife [name]             Patient will benefit from skilled therapeutic intervention in order to improve the following deficits and impairments:   Body Structure / Function / Physical Skills: ADL, Strength, Balance, Proprioception, UE functional use, IADL, Endurance, Vision, Mobility, Coordination,  Decreased knowledge of precautions, [place], GMC, Sensation Cognitive Skills: Attention, Safety Awareness, Memory, Perception, Problem Solve     Visit Diagnosis: Abnormal posture  Muscle weakness (generalized)  Ataxia  Other lack of coordination  Visuospatial deficit    Problem List Patient Active Problem List   Diagnosis Date Noted   Residual cognitive deficit as late effect of stroke 04/19/2021   Hemiparesis affecting right side as late effect of stroke ([place]) 04/19/2021   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 04/19/2021   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 ([place]) 04/19/2021   Thrombosis    Sleep disturbance    Dysphagia, post-stroke    PAF (paroxysmal atrial fibrillation) ([facility])    Acute pulmonary embolism without acute cor pulmonale ([facility])    Malnutrition of moderate degree 11/04/2020   Tracheostomy care ([place])    Pressure injury of skin 10/23/2020   Intracranial hemorrhage ([facility])    Atrial fibrillation ([facility])    Prediabetes    Leukocytosis    Acute blood loss anemia    Hypernatremia    ICH (intracerebral hemorrhage) ([place]) 09/25/2020   Unilateral primary osteoarthritis, left knee 09/26/2016   Atrial flutter ([place]) 09/25/2013   Near syncope 09/25/2013    Jasmine Mcbeth, OT/L 05/11/2021, 1:59 PM  [facility] [address] [place], [place], [address] Phone: [phone]   Fax:  [phone]  Name: Walter [name] MRN: [id number] Date of Birth: 1956/04/02

## 2021-05-11 NOTE — Therapy (Signed)
OUTPATIENT PHYSICAL THERAPY TREATMENT NOTE/Progress Note Patient Name: Walter [name] MRN: [id number] DOB:1956-01-07, 65 y.o., male Today's Date: 05/11/2021  PCP: Alroy Dust, L.Marlou Sa, MD REFERRING PROVIDER: Alroy Dust, Carlean Jews.Marlou Sa, MD      Dates of Reporting Period:12/26/2020-05/11/2021  See Note below for Objective Data and Assessment of Progress/Goals.      PT End of Session - 05/11/21 0932     Visit Number 40    Number of Visits 14    Date for PT Re-Evaluation 07/02/21    Authorization Type MCR    Authorization Time Period 04/28/21-07/03/21    Progress Note Due on Visit 33    PT Start Time 0930    PT Stop Time 1015    PT Time Calculation (min) 45 min    Behavior During Therapy [facility] for tasks assessed/performed                      Past Medical History:  Diagnosis Date   Atrial flutter (HCC)    s/p ablation   DVT (deep venous thrombosis) (HCC)    Near syncope 09/25/2013   Paroxysmal atrial fibrillation (HCC)    Stroke ([place])    initially hemorragic (not on [place]) followed by subsequent embolic stroke   Past Surgical History:  Procedure Laterality Date   A-FLUTTER ABLATION N/A 03/09/2020   Procedure: A-FLUTTER ABLATION;  Surgeon: Thompson Grayer, MD;  Location: [place] [facility];  Service: Cardiovascular;  Laterality: N/A;   CARDIOVERSION N/A 09/27/2013   Procedure: CARDIOVERSION;  Surgeon: Lelon Perla, MD;  Location: [facility] ENDOSCOPY;  Service: Cardiovascular;  Laterality: N/A;   IR ANGIO EXTERNAL CAROTID SEL EXT CAROTID UNI R MOD SED  09/26/2020   IR ANGIO INTRA EXTRACRAN SEL COM CAROTID INNOMINATE BILAT MOD SED  09/26/2020   IR ANGIO VERTEBRAL SEL VERTEBRAL UNI R MOD SED  09/26/2020   IR IVC FILTER PLMT / S&I /IMG GUID/MOD SED  10/08/2020   IR IVUS EACH ADDITIONAL NON CORONARY VESSEL  11/02/2020   IR PTA VENOUS EXCEPT DIALYSIS CIRCUIT  11/02/2020   IR RADIOLOGIST EVAL & MGMT  01/17/2021   IR THROMBECT VENO MECH MOD SED  10/27/2020   IR THROMBECT VENO MECH MOD SED   11/02/2020   IR US GUIDE VASC ACCESS LEFT  10/27/2020   IR US GUIDE VASC ACCESS RIGHT  09/26/2020   IR US GUIDE VASC ACCESS RIGHT  11/02/2020   IR VENO/EXT/UNI RIGHT  11/02/2020   IR VENOCAVAGRAM IVC  10/27/2020   RETINAL DETACHMENT SURGERY     right knee arthroscopy     TEE WITHOUT CARDIOVERSION N/A 09/27/2013   Procedure: TRANSESOPHAGEAL ECHOCARDIOGRAM (TEE);  Surgeon: Lelon Perla, MD;  Location: [facility];  Service: Cardiovascular;  Laterality: N/A;   WRIST SURGERY     Patient Active Problem List   Diagnosis Date Noted   Residual cognitive deficit as late effect of stroke 04/19/2021   Hemiparesis affecting right side as late effect of stroke ([place]) 04/19/2021   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 04/19/2021   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 ([place]) 04/19/2021   Thrombosis    Sleep disturbance    Dysphagia, post-stroke    PAF (paroxysmal atrial fibrillation) ([place])    Acute pulmonary embolism without acute cor pulmonale (HCC)    Malnutrition of moderate degree 11/04/2020   Tracheostomy care ([place])    Pressure injury of skin 10/23/2020   Intracranial hemorrhage (HCC)    Atrial fibrillation ([place])    Prediabetes  Leukocytosis    Acute blood loss anemia    Hypernatremia    ICH (intracerebral hemorrhage) ([place]) 09/25/2020   Unilateral primary osteoarthritis, left knee 09/26/2016   Atrial flutter ([place]) 09/25/2013   Near syncope 09/25/2013  See Note below for Objective Data and Assessment of Progress/Goals.  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  CG reports more stability with gait.  Has an appointment with a neuro-opthomologist on 11/30  TODAY'S  TREATMENT:   05/11/21  BERG score 41/56 in setting of ataxia with potential inter-rater reliability discrepancies possible  5x STS score 13.6s  Ambulation distance 553ft with RW and CGA requiring verbal and tactile cues for cadence, foot placement and postural correction  Scifit 8' L4 seat 20 arms 7  05/08/21  112ft ambulation with SPC weighted with 3#, 6# on LLE, 1 trip in each direction to perform R/L turning  In // bars stepping on 6" block  and raising arms OH, 10x each side with eight shifting onto step  1 minor LOB noted on each ambulation attempt with patient able to self correct given the opportunity  Extra time required during ambulation to allow for proper sequencing, cadence and balance correction   05/02/21 Pt seen for aquatic therapy today.  Treatment took place in water 3.25-4.8 ft in depth at the [facility] pool. Temp of water was 92.Pt entered/exited the pool via stairs step to pattern independently with bilat rail.                           Warm up: in lap pool pt swam crawl stroke 1.5 laps with sup   Standing -step ups 70% submerged x15 R/L -side stepping onto water step R/L x 15. Cues wofr weight shift and maintaining balance/control. Slow pace. -step over (step) vc slowing pace x 10 -High Knee Balance with Backward Walking with Hand Floats R/L -High Knee Balance with Forward Walking and Hand Floats R/L   Pt requires buoyancy for support and to offload joints with strengthening exercises. Viscosity of the water is needed for resistance of strengthening; water current perturbations provides challenge to standing balance unsupported, requiring increased core activation.    04/30/21 Seated placing and retrieving 4KG ball from floor to waist level 15x  Lifting 4 KG ball from waist level to OH 15x  Moving 4 KG ball from hip to hip 15x  STS with OH reaching against black t-band resistance Scifit seat 18, L5, arms 7 x 8 min  Ambulation in // bars, 5 trips fwd  and retro, 5 trips sidestepping In // bars, stepping and WS onto 6" block 10x per leg   04/27/21  Seated placing and retrieving 4KG ball from floor to waist level 15x  Lifting 4 KG ball from waist level to OH 15x  Moving 4 KG ball from hip to hip 15x  STS with OH reaching against black t-band resistance Scifit seat 18, L4, arms 7 x 8 min  BERG score 32/56  04/24/21  Ambulation of 164ft with 6# on LLE and 3# on cane, Min/ModA needed at times due to ataxia and possible lethargy from new meds, several LOB episodes noted requiring PT assist to prevent falls  Seated core exercises of hip/shoulder tosses, chops  and Vs with 6# ball 10x each task  Sit to stand using green t-band for resisted OH reach once standing  Seated forward reaching to floor to retrieve 4KG ball and reach [facility] with it, 10x        04/13/21 Pt seen for aquatic therapy today.  Treatment took place in water 3.25-4.8 ft in depth at the [facility] pool. Temp of water was 92.Pt entered/exited the pool via stairs step to pattern independently with bilat rail.                           Amb progressed to  with and without hand buoys forward, backward (posterior chain activation) and side stepping/walking cues for increased step length, posture and weight shifting x 4 lengths of pool. Pt requiring min assist with backward amb.   Standing -hip add/abd and extension x 15 reps holding to pool wall. VC for increases speed to increase resistance.   -hip extension 2 x 10 -step ups 70% submerged x15 R/L -step over (step) vc for placement and control of le/core x 10  Gait training 50% submerged cues for direct path, control of le/placement of feet.     Supine suspension Standing<>supine using 2 foam hand buoys x5 reps holding position x 10 second each. Vc and demo with min TC to gain position -in above position knees to chest with assist from therapist to maintain position 3 x 10 core strengthening   Aerobic capacity Swimming  crawl and breast stroke 6 lengths of pool sba continuously.  VC and TC for straight path   Pt requires buoyancy for support and to offload joints with strengthening exercises. Viscosity of the water is needed for resistance of strengthening; water current perturbations provides challenge to standing balance unsupported, requiring increased core activation.     04/11/21  Seated on blue disk, forward reaching to floor wih 4KG ball to lap and repeated w/o ball to vary resistance levels and have patient adapt  Searwd on blue disk, lifting 4KG ball OH and repeating w/o ball to challenge core and adapt to varied resistance challenges   Seated LAQs on disk, 6# wts on L ankle, boot on R  with lat press alternating pattern, 1x15  Seated marching on disk, 6# wts on L ankle, boot on R, with lat press alternating pattern 1x15  15x STS performing OH press against red band once standing, CGA needed for stability  119ft ambulation with 3# weighted cane, 6# on L ankle, requiring Min/ModA to prevent fall  133ft ambulaton with 6# weighted L ankle and RW with Min/ModA needed to stabilize     PATIENT EDUCATION: Education details: Scheduling more PT visits for October Person educated: Patient and caregiver Education method: Consulting civil engineer, Media planner, and Verbal cues Education comprehension: verbalized understanding     HOME EXERCISE PROGRAM: House ambulation with Cgs in RW/Consider membership to a pool.  ASSESSMENT:   CLINICAL IMPRESSION: Goals assessed with progress noted towards 5x STS goal, BERG goal and ambulation goal using RW.  LTGs revised to accommodate progress and remaining deficits.   REHAB POTENTIAL: Good   CLINICAL DECISION MAKING: Stable/uncomplicated  GOALS: Goals reviewed with patient? Yes   SHORT TERM GOALS:   STG Name Target Date Goal status  1 Patient will be able to perform chair to chair transfer with CGA/SBA with use of RW for safety to improve independence. Baseline: min  to mod A (12/26/20); CGA/minA (03/09/21) 01/23/2021 03/27/21 Able to transfer STS  and stand pivot with SBA, goal met  2 Pt will be able to ambulate 115' with RW with CGA to min A with RW for safety to improve independence Baseline: min to mod A with RW 145'; 230 feet with RW and CGA to MIN A 01/23/2021 03/09/21 Goal met  3 Pt will be able to maintain standing balance for 30 sec to improve static balance Baseline: 10 sec before needing help due to LOB (12/26/20) 01/23/2021 02/05/21 able to stand with S for 30s, goal met  4. Pt will be able to perform sit to stand transfers with SBA to improve independence 04/06/21 Goal met 03/27/21  5. Pt will be able to perform 230 feet of walking with RW with SBA/CGA to improve household ambulation  04/06/21 05/11/21 537ft with RW and CGA, GOAL MET    LONG TERM GOALS:   LTG Name Target Date Goal status  1 Pt will demo >5 points improvement on Berg Balance Scale to improve static balance Baseline: 11/56 01/03/21 05/28/2021 05/11/21 BERG score 41/56  2 Pt will be able to ambulate >250 feet with cane and CGA to improve walking endurance and safety. Baseline: 145' with RW min to mod A; 230 feet with CGA to Min A(revised 05/11/21)  05/28/21 05/11/21 587ft with RW and CGA  3 Pt  will be able to perform gait speed improvement by 0.58m/s to improve community ambulation. Baseline:0.59m/s (03/09/21) 05/28/21 Progressing continue 04/03/21 UTA due to achilles partial tear  4 Pt will demo 5 sec improvement in 5x sit to stand (with or without UE support) to improve functional strength Baseline: 5x sit to stand with bil UE 17 sec;  05/28/21 05/11/21 5x STS 13.6s    PLAN: PT FREQUENCY: 2-3x/week   PT DURATION: 8 weeks   PLANNED INTERVENTIONS: Aquatic therapy, Therapeutic exercises, Therapeutic activity, Neuro Muscular re-education, Balance training, Gait training, Patient/Family education, Joint mobilization, Stair training, Visual/preceptual remediation/compensation, Wheelchair mobility  training, Cryotherapy, Moist heat, and Manual therapy   PLAN FOR NEXT SESSION:  Core and balance strengthening.  Reinforce transfer safety and proper technique, ambulation with weighted cane and LLE as appropriate, // bars ambulation and stepping tasks, balance challenges in staggered stance positions  Visit Diagnosis: No diagnosis found.     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 04/19/2021   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 ([place]) 04/19/2021   Thrombosis    Sleep disturbance    Dysphagia, post-stroke    PAF (paroxysmal atrial fibrillation) ([place])    Acute pulmonary embolism without acute cor pulmonale (HCC)    Malnutrition of moderate degree 11/04/2020   Tracheostomy care ([place])    Pressure injury of skin 10/23/2020   Intracranial hemorrhage (HCC)    Atrial fibrillation ([place])    Prediabetes    Leukocytosis    Acute blood loss anemia    Hypernatremia    ICH (intracerebral hemorrhage) ([place]) 09/25/2020   Unilateral primary osteoarthritis, left knee 09/26/2016   Atrial flutter ([place]) 09/25/2013   Near syncope 09/25/2013    Lanice Shirts, PT 05/11/2021, 9:38 AM  [facility] [address] [place], [place], [address] Phone: [phone]   Fax:  [facility] [address] [place], [place], [address] Phone: [phone]   Fax:  [phone]

## 2021-05-14 ENCOUNTER — Other Ambulatory Visit: Payer: Self-pay

## 2021-05-14 ENCOUNTER — Encounter (HOSPITAL_BASED_OUTPATIENT_CLINIC_OR_DEPARTMENT_OTHER): Payer: Self-pay | Admitting: Physical Therapy

## 2021-05-14 ENCOUNTER — Ambulatory Visit (HOSPITAL_BASED_OUTPATIENT_CLINIC_OR_DEPARTMENT_OTHER): Payer: Medicare Other | Attending: Physical Medicine & Rehabilitation | Admitting: Physical Therapy

## 2021-05-14 DIAGNOSIS — R293 Abnormal posture: Secondary | ICD-10-CM

## 2021-05-14 DIAGNOSIS — R2689 Other abnormalities of gait and mobility: Secondary | ICD-10-CM

## 2021-05-14 DIAGNOSIS — M25561 Pain in right knee: Secondary | ICD-10-CM | POA: Insufficient documentation

## 2021-05-14 DIAGNOSIS — R2681 Unsteadiness on feet: Secondary | ICD-10-CM

## 2021-05-14 DIAGNOSIS — M6281 Muscle weakness (generalized): Secondary | ICD-10-CM | POA: Diagnosis present

## 2021-05-14 DIAGNOSIS — R262 Difficulty in walking, not elsewhere classified: Secondary | ICD-10-CM

## 2021-05-14 DIAGNOSIS — G8929 Other chronic pain: Secondary | ICD-10-CM | POA: Diagnosis present

## 2021-05-14 NOTE — Therapy (Signed)
OUTPATIENT PHYSICAL THERAPY TREATMENT NOTE/Progress Note Patient Name: Walter [name] MRN: [id number] DOB:02/11/1956, 65 y.o., male Today's Date: 05/14/2021  PCP: Alroy Dust, L.Marlou Sa, MD REFERRING PROVIDER: Alroy Dust, Carlean Jews.Marlou Sa, MD       PT End of Session - 05/14/21 0851     Visit Number 41    Number of Visits 42    Date for PT Re-Evaluation 07/02/21    Authorization Type MCR    Authorization Time Period 04/28/21-07/03/21    Progress Note Due on Visit 86    PT Start Time 0900    PT Stop Time 0945    PT Time Calculation (min) 45 min    Equipment Utilized During Treatment Gait belt    Activity Tolerance Patient tolerated treatment well;Treatment limited secondary to medical complications (Comment)    Behavior During Therapy [facility] for tasks assessed/performed                      Past Medical History:  Diagnosis Date   Atrial flutter (HCC)    s/p ablation   DVT (deep venous thrombosis) (HCC)    Near syncope 09/25/2013   Paroxysmal atrial fibrillation ([place])    Stroke [facility])    initially hemorragic (not on Benjamin Perez) followed by subsequent embolic stroke   Past Surgical History:  Procedure Laterality Date   A-FLUTTER ABLATION N/A 03/09/2020   Procedure: A-FLUTTER ABLATION;  Surgeon: Thompson Grayer, MD;  Location: [place] CV LAB;  Service: Cardiovascular;  Laterality: N/A;   CARDIOVERSION N/A 09/27/2013   Procedure: CARDIOVERSION;  Surgeon: Lelon Perla, MD;  Location: [facility] ENDOSCOPY;  Service: Cardiovascular;  Laterality: N/A;   IR ANGIO EXTERNAL CAROTID SEL EXT CAROTID UNI R MOD SED  09/26/2020   IR ANGIO INTRA EXTRACRAN SEL COM CAROTID INNOMINATE BILAT MOD SED  09/26/2020   IR ANGIO VERTEBRAL SEL VERTEBRAL UNI R MOD SED  09/26/2020   IR IVC FILTER PLMT / S&I /IMG GUID/MOD SED  10/08/2020   IR IVUS EACH ADDITIONAL NON CORONARY VESSEL  11/02/2020   IR PTA VENOUS EXCEPT DIALYSIS CIRCUIT  11/02/2020   IR RADIOLOGIST EVAL & MGMT  01/17/2021   IR THROMBECT VENO MECH MOD SED   10/27/2020   IR THROMBECT VENO MECH MOD SED  11/02/2020   IR US GUIDE VASC ACCESS LEFT  10/27/2020   IR US GUIDE VASC ACCESS RIGHT  09/26/2020   IR US GUIDE VASC ACCESS RIGHT  11/02/2020   IR VENO/EXT/UNI RIGHT  11/02/2020   IR VENOCAVAGRAM IVC  10/27/2020   RETINAL DETACHMENT SURGERY     right knee arthroscopy     TEE WITHOUT CARDIOVERSION N/A 09/27/2013   Procedure: TRANSESOPHAGEAL ECHOCARDIOGRAM (TEE);  Surgeon: Lelon Perla, MD;  Location: [facility] ENDOSCOPY;  Service: Cardiovascular;  Laterality: N/A;   WRIST SURGERY     Patient Active Problem List   Diagnosis Date Noted   Residual cognitive deficit as late effect of stroke 04/19/2021   Hemiparesis affecting right side as late effect of stroke ([place]) 04/19/2021   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 04/19/2021   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 ([place]) 04/19/2021   Thrombosis    Sleep disturbance    Dysphagia, post-stroke    PAF (paroxysmal atrial fibrillation) ([place])    Acute pulmonary embolism without acute cor pulmonale (HCC)    Malnutrition of moderate degree 11/04/2020   Tracheostomy care ([place])    Pressure injury of skin 10/23/2020   Intracranial hemorrhage ([place])    Atrial fibrillation ([place])  Prediabetes    Leukocytosis    Acute blood loss anemia    Hypernatremia    ICH (intracerebral hemorrhage) ([place]) 09/25/2020   Unilateral primary osteoarthritis, left knee 09/26/2016   Atrial flutter ([place]) 09/25/2013   Near syncope 09/25/2013  See Note below for Objective Data and Assessment of Progress/Goals.  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  Sats he is feeling good.  Was accepted into program in  [place] in January.  TODAY'S TREATMENT:   05/14/21  Pt seen for aquatic therapy today.  Treatment took place in water 3.25-4.8 ft in depth at the [employer] pool. Temp of water was 92.Pt entered/exited the pool via stairs step to pattern independently with bilat rail.               Warm up: water walking 3 lengths of pool forward and back unassisted without ue support   Standing -step tapping R/L -pilates: lat pull down x x10 yellow noodle. -supine and prone suspension with assistance. Rolling sup to and from prone x 5 -knees to chest 3x10 sup suspension -High Knee Balance with Backward Walking with Hand Floats R/L x 10 each -High Knee Balance with Forward Walking and Hand Floats R/L x 10 each -High knee balance with side stepping and hand floats x 10   Pt requires buoyancy for support and to offload joints with strengthening exercises. Viscosity of the water is needed for resistance of strengthening; water current perturbations provides challenge to standing balance unsupported, requiring increased core activation.   05/11/21  BERG score 41/56 in setting of ataxia with potential inter-rater reliability discrepancies possible  5x STS score 13.6s  Ambulation distance 544f with RW and CGA requiring verbal and tactile cues for cadence, foot placement and postural correction  Scifit 8' L4 seat 20 arms 7  05/08/21  1127fambulation with SPC weighted with 3#, 6# on LLE, 1 trip in each direction to perform R/L turning  In // bars stepping on 6" block  and raising arms OH, 10x each side with eight shifting onto step  1 minor LOB noted on each ambulation attempt with patient able to self correct given the opportunity  Extra time required during ambulation to allow for proper sequencing, cadence and balance correction   05/02/21 Pt seen for aquatic therapy today.  Treatment took place in water 3.25-4.8 ft [facility] Temp of water was 92.Pt  entered/exited the pool via stairs step to pattern independently with bilat rail.                           Warm up: in lap pool pt swam crawl stroke 1.5 laps with sup   Standing -step ups 70% submerged x15 R/L -side stepping onto water step R/L x 15. Cues wofr weight shift and maintaining balance/control. Slow pace. -step over (step) vc slowing pace x 10 -High Knee Balance with Backward Walking with Hand Floats R/L -High Knee Balance with Forward Walking and Hand Floats R/L   Pt requires buoyancy for support and to offload joints with strengthening exercises. Viscosity of the water is needed for resistance of strengthening; water current perturbations provides challenge to standing balance unsupported, requiring increased core activation.  PATIENT EDUCATION: Education details: Scheduling more PT visits for October Person educated: Patient and caregiver Education method: Explanation, Media planner, and Verbal cues Education comprehension: verbalized understanding     HOME EXERCISE PROGRAM: House ambulation with Cgs in RW/Consider membership to a pool.  ASSESSMENT:   CLINICAL IMPRESSION: Added additional exercises to core strengthening for improved balance using pilates technique. Pt with good challenge activating core more effectively.  He does have difficulty maintaining plank position but executes fairly well with assistance.  He enjoys session and reports feeling good core engagement throughout.     REHAB POTENTIAL: Good   CLINICAL DECISION MAKING: Stable/uncomplicated  GOALS: Goals reviewed with patient? Yes   SHORT TERM GOALS:   STG Name Target Date Goal status  1 Patient will be able to perform chair to chair transfer with CGA/SBA with use of RW for safety to improve independence. Baseline: min to mod A (12/26/20); CGA/minA (03/09/21) 01/23/2021 03/27/21 Able to transfer STS and stand pivot with SBA, goal met  2 Pt will be able to ambulate 115' with RW with CGA to min A  with RW for safety to improve independence Baseline: min to mod A with RW 145'; 230 feet with RW and CGA to MIN A 01/23/2021 03/09/21 Goal met  3 Pt will be able to maintain standing balance for 30 sec to improve static balance Baseline: 10 sec before needing help due to LOB (12/26/20) 01/23/2021 02/05/21 able to stand with S for 30s, goal met  4. Pt will be able to perform sit to stand transfers with SBA to improve independence 04/06/21 Goal met 03/27/21  5. Pt will be able to perform 230 feet of walking with RW with SBA/CGA to improve household ambulation  04/06/21 05/11/21 568f with RW and CGA, GOAL MET    LONG TERM GOALS:   LTG Name Target Date Goal status  1 Pt will demo >5 points improvement on Berg Balance Scale to improve static balance Baseline: 11/56 01/03/21 05/28/2021 05/11/21 BERG score 41/56  2 Pt will be able to ambulate >250 feet with cane and CGA to improve walking endurance and safety. Baseline: 145' with RW min to mod A; 230 feet with CGA to Min A(revised 05/11/21)  05/28/21 05/11/21 5071fwith RW and CGA  3 Pt  will be able to perform gait speed improvement by 0.1558mto improve community ambulation. Baseline:0.45m36m9/9/22) 05/28/21 Progressing continue 04/03/21 UTA due to achilles partial tear  4 Pt will demo 5 sec improvement in 5x sit to stand (with or without UE support) to improve functional strength Baseline: 5x sit to stand with bil UE 17 sec;  05/28/21 05/11/21 5x STS 13.6s    PLAN: PT FREQUENCY: 2-3x/week   PT DURATION: 8 weeks   PLANNED INTERVENTIONS: Aquatic therapy, Therapeutic exercises, Therapeutic activity, Neuro Muscular re-education, Balance training, Gait training, Patient/Family education, Joint mobilization, Stair training, Visual/preceptual remediation/compensation, Wheelchair mobility training, Cryotherapy, Moist heat, and Manual therapy   PLAN FOR NEXT SESSION:  Core and balance strengthening.  Reinforce transfer safety and proper technique, ambulation  with weighted cane and LLE as appropriate, // bars ambulation and stepping tasks, balance challenges in staggered stance positions  Visit Diagnosis: Abnormal posture  Chronic pain of right knee  Difficulty in walking, not elsewhere classified  Muscle weakness (generalized)  Unsteadiness on feet  Other abnormalities of gait and mobility     Problem List Patient Active Problem List   Diagnosis Date Noted   Residual cognitive deficit as late effect of stroke 04/19/2021  Hemiparesis affecting right side as late effect of stroke ([place]) 04/19/2021   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 04/19/2021   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 ([place]) 04/19/2021   Thrombosis    Sleep disturbance    Dysphagia, post-stroke    PAF (paroxysmal atrial fibrillation) ([place])    Acute pulmonary embolism without acute cor pulmonale (HCC)    Malnutrition of moderate degree 11/04/2020   Tracheostomy care ([place])    Pressure injury of skin 10/23/2020   Intracranial hemorrhage (HCC)    Atrial fibrillation (HCC)    Prediabetes    Leukocytosis    Acute blood loss anemia    Hypernatremia    ICH (intracerebral hemorrhage) ([place]) 09/25/2020   Unilateral primary osteoarthritis, left knee 09/26/2016   Atrial flutter ([place]) 09/25/2013   Near syncope 09/25/2013    Vedia Pereyra, MPT 05/14/2021, 11:08 AM  [facility] [address] [place], [place], [address] Phone: [phone]   Fax:  [phone]

## 2021-05-17 ENCOUNTER — Ambulatory Visit: Payer: Medicare Other

## 2021-05-17 ENCOUNTER — Other Ambulatory Visit: Payer: Self-pay

## 2021-05-17 ENCOUNTER — Ambulatory Visit: Payer: Medicare Other | Admitting: Occupational Therapy

## 2021-05-17 ENCOUNTER — Encounter: Payer: Self-pay | Admitting: Occupational Therapy

## 2021-05-17 DIAGNOSIS — R270 Ataxia, unspecified: Secondary | ICD-10-CM

## 2021-05-17 DIAGNOSIS — R293 Abnormal posture: Secondary | ICD-10-CM

## 2021-05-17 DIAGNOSIS — R41841 Cognitive communication deficit: Secondary | ICD-10-CM

## 2021-05-17 DIAGNOSIS — R2681 Unsteadiness on feet: Secondary | ICD-10-CM

## 2021-05-17 DIAGNOSIS — R41842 Visuospatial deficit: Secondary | ICD-10-CM

## 2021-05-17 DIAGNOSIS — R262 Difficulty in walking, not elsewhere classified: Secondary | ICD-10-CM

## 2021-05-17 DIAGNOSIS — M6281 Muscle weakness (generalized): Secondary | ICD-10-CM

## 2021-05-17 DIAGNOSIS — R2689 Other abnormalities of gait and mobility: Secondary | ICD-10-CM

## 2021-05-17 DIAGNOSIS — R278 Other lack of coordination: Secondary | ICD-10-CM

## 2021-05-17 NOTE — Therapy (Signed)
OUTPATIENT PHYSICAL THERAPY TREATMENT NOTE/Progress Note Patient Name: Walter [name] MRN: [id number] DOB:05-27-1956, 65 y.o., male Today's Date: 05/17/2021  PCP: Alroy Dust, L.Marlou Sa, MD REFERRING PROVIDER: Alroy Dust, Carlean Jews.Marlou Sa, MD      Dates of Reporting Period:12/26/2020-05/11/2021  See Note below for Objective Data and Assessment of Progress/Goals.      PT End of Session - 05/17/21 1320     Visit Number 42    Number of Visits 70    Date for PT Re-Evaluation 07/02/21    Authorization Type MCR    Authorization Time Period 04/28/21-07/03/21    Progress Note Due on Visit 75    PT Start Time 1315    PT Stop Time 1400    PT Time Calculation (min) 45 min    Equipment Utilized During Treatment Gait belt    Activity Tolerance Patient tolerated treatment well    Behavior During Therapy WFL for tasks assessed/performed                      Past Medical History:  Diagnosis Date   Atrial flutter ([place])    s/p ablation   DVT (deep venous thrombosis) (HCC)    Near syncope 09/25/2013   Paroxysmal atrial fibrillation (HCC)    Stroke ([place])    initially hemorragic (not on [place]) followed by subsequent embolic stroke   Past Surgical History:  Procedure Laterality Date   A-FLUTTER ABLATION N/A 03/09/2020   Procedure: A-FLUTTER ABLATION;  Surgeon: Thompson Grayer, MD;  Location: [facility];  Service: Cardiovascular;  Laterality: N/A;   CARDIOVERSION N/A 09/27/2013   Procedure: CARDIOVERSION;  Surgeon: Lelon Perla, MD;  Location: [facility] ENDOSCOPY;  Service: Cardiovascular;  Laterality: N/A;   IR ANGIO EXTERNAL CAROTID SEL EXT CAROTID UNI R MOD SED  09/26/2020   IR ANGIO INTRA EXTRACRAN SEL COM CAROTID INNOMINATE BILAT MOD SED  09/26/2020   IR ANGIO VERTEBRAL SEL VERTEBRAL UNI R MOD SED  09/26/2020   IR IVC FILTER PLMT / S&I /IMG GUID/MOD SED  10/08/2020   IR IVUS EACH ADDITIONAL NON CORONARY VESSEL  11/02/2020   IR PTA VENOUS EXCEPT DIALYSIS CIRCUIT  11/02/2020   IR RADIOLOGIST  EVAL & MGMT  01/17/2021   IR THROMBECT VENO MECH MOD SED  10/27/2020   IR THROMBECT VENO MECH MOD SED  11/02/2020   IR US GUIDE VASC ACCESS LEFT  10/27/2020   IR US GUIDE VASC ACCESS RIGHT  09/26/2020   IR US GUIDE VASC ACCESS RIGHT  11/02/2020   IR VENO/EXT/UNI RIGHT  11/02/2020   IR VENOCAVAGRAM IVC  10/27/2020   RETINAL DETACHMENT SURGERY     right knee arthroscopy     TEE WITHOUT CARDIOVERSION N/A 09/27/2013   Procedure: TRANSESOPHAGEAL ECHOCARDIOGRAM (TEE);  Surgeon: Lelon Perla, MD;  Location: 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 04/19/2021   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 ([place]) 04/19/2021   Thrombosis    Sleep disturbance    Dysphagia, post-stroke    PAF (paroxysmal atrial fibrillation) ([place])    Acute pulmonary embolism without acute cor pulmonale (HCC)    Malnutrition of moderate degree 11/04/2020   Tracheostomy care ([place])    Pressure injury of skin  10/23/2020   Intracranial hemorrhage (HCC)    Atrial fibrillation (HCC)    Prediabetes    Leukocytosis    Acute blood loss anemia    Hypernatremia    ICH (intracerebral hemorrhage) ([place]) 09/25/2020   Unilateral primary osteoarthritis, left knee 09/26/2016   Atrial flutter ([place]) 09/25/2013   Near syncope 09/25/2013  See Note below for Objective Data and Assessment of Progress/Goals.  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 Has an appointment  with a neuro-opthomologist on 11/30.  Requests trial of rollator vs RW  TODAY'S TREATMENT: 05/17/21 Ambulation trials of 167f x3 with 6# weight on L ankle, once with rollator, once with RW and once with weighted cane(3#).  Some difficulty controlling rollator per patient admission.  Negotiation of 16 steps with B rails ascending with step through and descending step to sideways.  Fatigued after stairs.  Remaining time spent performing core tasks of 4KG ball transfers from floor to [facility] to [facility] hip tosses, shoulder tosses and Vs all with 4 KG ball   05/11/21  BERG score 41/56 in setting of ataxia with potential inter-rater reliability discrepancies possible  5x STS score 13.6s  Ambulation distance 5062fwith RW and CGA requiring verbal and tactile cues for cadence, foot placement and postural correction  Scifit 8' L4 seat 20 arms 7  05/08/21  11579fmbulation with SPC weighted with 3#, 6# on LLE, 1 trip in each direction to perform R/L turning  In // bars stepping on 6" block  and raising arms OH, 10x each side with eight shifting onto step  1 minor LOB noted on each ambulation attempt with patient able to self correct given the opportunity  Extra time required during ambulation to allow for proper sequencing, cadence and balance correction   05/02/21 Pt seen for aquatic therapy today.  Treatment took place in water 3.25-4.8 ft in [facility] Temp of water was 92.Pt entered/exited the pool via stairs step to pattern independently with bilat rail.                           Warm up: in lap pool pt swam crawl stroke 1.5 laps with sup   Standing -step ups 70% submerged x15 R/L -side stepping onto water step R/L x 15. Cues wofr weight shift and maintaining balance/control. Slow pace. -step over (step) vc slowing pace x 10 -High Knee Balance with Backward Walking with Hand Floats R/L -High Knee Balance with Forward Walking and Hand Floats R/L   Pt requires buoyancy  for support and to offload joints with strengthening exercises. Viscosity of the water is needed for resistance of strengthening; water current perturbations provides challenge to standing balance unsupported, requiring increased core activation.    04/30/21 Seated placing and retrieving 4KG ball from floor to waist level 15x  Lifting 4 KG ball from waist level to OH 15x  Moving 4 KG ball from hip to hip 15x  STS with OH reaching against black t-band resistance Scifit seat 18, L5, arms 7 x 8 min  Ambulation in // bars, 5 trips fwd and retro, 5 trips sidestepping In // bars, stepping and WS onto 6" block 10x per leg   04/27/21  Seated placing  and retrieving 4KG ball from floor to waist level 15x  Lifting 4 KG ball from waist level to OH 15x  Moving 4 KG ball from hip to hip 15x  STS with OH reaching against black t-band resistance Scifit seat 18, L4, arms 7 x 8 min  BERG score 32/56  04/24/21  Ambulation of 137f with 6# on LLE and 3# on cane, Min/ModA needed at times due to ataxia and possible lethargy from new meds, several LOB episodes noted requiring PT assist to prevent falls  Seated core exercises of hip/shoulder tosses, chops and Vs with 6# ball 10x each task  Sit to stand using green t-band for resisted OH reach once standing  Seated forward reaching to floor to retrieve 4KG ball and reach [facility], 10x        04/13/21 Pt seen for aquatic therapy today.  Treatment took place in water 3.25-4.8 ft [facility] Temp of water was 92.Pt entered/exited the pool via stairs step to pattern independently with bilat rail.                           Amb progressed to  with and without hand buoys forward, backward (posterior chain activation) and side stepping/walking cues for increased step length, posture and weight shifting x 4 lengths of pool. Pt requiring min assist with backward amb.   Standing -hip add/abd and extension x 15 reps holding to pool wall.  VC for increases speed to increase resistance.   -hip extension 2 x 10 -step ups 70% submerged x15 R/L -step over (step) vc for placement and control of le/core x 10  Gait training 50% submerged cues for direct path, control of le/placement of feet.     Supine suspension Standing<>supine using 2 foam hand buoys x5 reps holding position x 10 second each. Vc and demo with min TC to gain position -in above position knees to chest with assist from therapist to maintain position 3 x 10 core strengthening   Aerobic capacity Swimming crawl and breast stroke 6 lengths of pool sba continuously.  VC and TC for straight path   Pt requires buoyancy for support and to offload joints with strengthening exercises. Viscosity of the water is needed for resistance of strengthening; water current perturbations provides challenge to standing balance unsupported, requiring increased core activation.     04/11/21  Seated on blue disk, forward reaching to floor wih 4KG ball to lap and repeated w/o ball to vary resistance levels and have patient adapt  Searwd on blue disk, lifting 4KG ball OH and repeating w/o ball to challenge core and adapt to varied resistance challenges   Seated LAQs on disk, 6# wts on L ankle, boot on R  with lat press alternating pattern, 1x15  Seated marching on disk, 6# wts on L ankle, boot on R, with lat press alternating pattern 1x15  15x STS performing OH press against red band once standing, CGA needed for stability  1157fambulation with 3# weighted cane, 6# on L ankle, requiring Min/ModA to prevent fall  11533fmbulaton with 6# weighted L ankle and RW with Min/ModA needed to stabilize     PATIENT EDUCATION: Education details: Scheduling more PT visits for October Person educated: Patient and caregiver Education method: ExpConsulting civil engineeremMedia plannernd Verbal cues Education comprehension: verbalized understanding     HOME EXERCISE PROGRAM: House ambulation with Cgs in  RW/Consider membership to a pool.  ASSESSMENT:   CLINICAL IMPRESSION: Ambulation trials indicate RW most appropriate for patient.  Able to negotiate a full flight of stairs with SBA and B railing support.  Rollator too challenging for patient  REHAB POTENTIAL: Good   CLINICAL DECISION MAKING: Stable/uncomplicated  GOALS: Goals reviewed with patient? Yes   SHORT TERM GOALS:   STG Name Target Date Goal status  1 Patient will be able to perform chair to chair transfer with CGA/SBA with use of RW for safety to improve independence. Baseline: min to mod A (12/26/20); CGA/minA (03/09/21) 01/23/2021 03/27/21 Able to transfer STS and stand pivot with SBA, goal met  2 Pt will be able to ambulate 115' with RW with CGA to min A with RW for safety to improve independence Baseline: min to mod A with RW 145'; 230 feet with RW and CGA to MIN A 01/23/2021 03/09/21 Goal met  3 Pt will be able to maintain standing balance for 30 sec to improve static balance Baseline: 10 sec before needing help due to LOB (12/26/20) 01/23/2021 02/05/21 able to stand with S for 30s, goal met  4. Pt will be able to perform sit to stand transfers with SBA to improve independence 04/06/21 Goal met 03/27/21  5. Pt will be able to perform 230 feet of walking with RW with SBA/CGA to improve household ambulation  04/06/21 05/11/21 538f with RW and CGA, GOAL MET    LONG TERM GOALS:   LTG Name Target Date Goal status  1 Pt will demo >5 points improvement on Berg Balance Scale to improve static balance Baseline: 11/56 01/03/21 05/28/2021 05/11/21 BERG score 41/56  2 Pt will be able to ambulate >250 feet with cane and CGA to improve walking endurance and safety. Baseline: 145' with RW min to mod A; 230 feet with CGA to Min A(revised 05/11/21)  05/28/21 05/11/21 5041fwith RW and CGA  3 Pt  will be able to perform gait speed improvement by 0.1531mto improve community ambulation. Baseline:0.75m38m9/9/22) 05/28/21 Progressing  continue 04/03/21 UTA due to achilles partial tear  4 Pt will demo 5 sec improvement in 5x sit to stand (with or without UE support) to improve functional strength Baseline: 5x sit to stand with bil UE 17 sec;  05/28/21 05/11/21 5x STS 13.6s    PLAN: PT FREQUENCY: 2-3x/week   PT DURATION: 8 weeks   PLANNED INTERVENTIONS: Aquatic therapy, Therapeutic exercises, Therapeutic activity, Neuro Muscular re-education, Balance training, Gait training, Patient/Family education, Joint mobilization, Stair training, Visual/preceptual remediation/compensation, Wheelchair mobility training, Cryotherapy, Moist heat, and Manual therapy   PLAN FOR NEXT SESSION:  Core and balance strengthening.  Reinforce transfer safety and proper technique, ambulation with weighted cane and LLE as appropriate, // bars ambulation and stepping tasks, balance challenges in staggered stance positions, stair training  Visit Diagnosis: No diagnosis found.     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 04/19/2021   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 (HCC)[place]/20/2022   Thrombosis    Sleep disturbance    Dysphagia, post-stroke    PAF (paroxysmal atrial fibrillation) (HCC)[place] Acute pulmonary embolism without acute cor pulmonale (HCC)    Malnutrition of moderate degree 11/04/2020   Tracheostomy care (HCC)[place] Pressure injury of skin 10/23/2020   Intracranial hemorrhage (  [place])    Atrial fibrillation ([place])    Prediabetes    Leukocytosis    Acute blood loss anemia    Hypernatremia    ICH (intracerebral hemorrhage) ([place]) 09/25/2020   Unilateral primary osteoarthritis, left knee 09/26/2016   Atrial flutter ([place]) 09/25/2013   Near syncope 09/25/2013    Lanice Shirts, PT 05/17/2021, 1:33  PM  [facility] [address] [place], [place], [address] Phone: [phone]   Fax:  [facility] [address] [place], [place], [address] Phone: [phone]   Fax:  [phone]

## 2021-05-17 NOTE — Therapy (Signed)
[facility] [address] [place], [place], [address] Phone: [phone]   Fax:  [phone]  Occupational Therapy Treatment  Patient Details  Name: Walter [name] MRN: [id number] Date of Birth: 1955-11-29 Referring Provider (OT): Dr. Alysia Penna   Encounter Date: 05/17/2021   OT End of Session - 05/17/21 1253     Visit Number 54    Date for OT Re-Evaluation 07/09/20    Authorization Type Medicare Part A & B **need to add KX**    Authorization - Visit Number 49    Authorization - Number of Visits 40    Progress Note Due on Visit 29    OT Start Time 1148    OT Stop Time 1230    OT Time Calculation (min) 42 min    Activity Tolerance Patient tolerated treatment well    Behavior During Therapy [facility] for tasks assessed/performed             Past Medical History:  Diagnosis Date   Atrial flutter ([place])    s/p ablation   DVT (deep venous thrombosis) ([place])    Near syncope 09/25/2013   Paroxysmal atrial fibrillation ([place])    Stroke ([place])    initially hemorragic (not on [place]) followed by subsequent embolic stroke    Past Surgical History:  Procedure Laterality Date   A-FLUTTER ABLATION N/A 03/09/2020   Procedure: A-FLUTTER ABLATION;  Surgeon: Thompson Grayer, MD;  Location: [facility];  Service: Cardiovascular;  Laterality: N/A;   CARDIOVERSION N/A 09/27/2013   Procedure: CARDIOVERSION;  Surgeon: Lelon Perla, MD;  Location: [facility] ENDOSCOPY;  Service: Cardiovascular;  Laterality: N/A;   IR ANGIO EXTERNAL CAROTID SEL EXT CAROTID UNI R MOD SED  09/26/2020   IR ANGIO INTRA EXTRACRAN SEL COM CAROTID INNOMINATE BILAT MOD SED  09/26/2020   IR ANGIO VERTEBRAL SEL VERTEBRAL UNI R MOD SED  09/26/2020   IR IVC FILTER PLMT / S&I /IMG GUID/MOD SED  10/08/2020   IR IVUS EACH ADDITIONAL NON CORONARY VESSEL  11/02/2020   IR PTA VENOUS EXCEPT DIALYSIS CIRCUIT  11/02/2020   IR RADIOLOGIST EVAL & MGMT  01/17/2021   IR THROMBECT  VENO MECH MOD SED  10/27/2020   IR THROMBECT VENO MECH MOD SED  11/02/2020   IR US GUIDE VASC ACCESS LEFT  10/27/2020   IR US GUIDE VASC ACCESS RIGHT  09/26/2020   IR US GUIDE VASC ACCESS RIGHT  11/02/2020   IR VENO/EXT/UNI RIGHT  11/02/2020   IR VENOCAVAGRAM IVC  10/27/2020   RETINAL DETACHMENT SURGERY     right knee arthroscopy     TEE WITHOUT CARDIOVERSION N/A 09/27/2013   Procedure: TRANSESOPHAGEAL ECHOCARDIOGRAM (TEE);  Surgeon: Lelon Perla, MD;  Location: [facility] ENDOSCOPY;  Service: Cardiovascular;  Laterality: N/A;   WRIST SURGERY      There were no vitals filed for this visit.   Subjective Assessment - 05/17/21 1158     Subjective  Patient indicates that he is still working with IP Rehab in [place] for an [place] admission in January    Pertinent History ICH of brainstem (complicated by hydrocephalus, DVTs, and tracheostomy).  PMH:  a-fib    Limitations fall risk, ataxia, impulsivity    Patient Stated Goals use L side of body better (decrease ataxia), stay active and heal brain    Currently in Pain? No/denies    Pain Score 0-No pain  OT Treatments/Exercises (OP) - 05/17/21 0001       Visual/Perceptual Exercises   Other Exercises Patient continues to maintain right eye closed while glasses are on.  Patient reports that prism cannot correct for the angle at which his double vision presents.      Neurological Re-education Exercises   Other Exercises 1 Patient transitioned from sit to stand in lobby and jettisoned himself so far forward that he needed physical assistance to avoid a fall.  When questioned why this transition was unsafe he answered "impulsivity."  Continued to work toward improved safty and control with simple transitional movements sit to stand, stand to sit.  Patient with strong tendency to lock left knee in standing - limiting options for self balance.  When encourgaed to stand with slightly soft knee - patient bounces on balls of feet.   Patient does best with very clear targeted objectives immediately preceding transition - sit tall, rach arms forward, move slowly.  Patient consistent within session, but has difficulty with carryover from session to session.    Other Exercises 2 Standing reaching overhead with right and then left hand to address ataxia left UE, and decreased sensory awareness right UE,  as well as balance                    OT Education - 05/17/21 1251     Education Details safety with sit to stand              OT Short Term Goals - 05/17/21 1257       OT SHORT TERM GOAL #1   Title Pt/caregiver will be independent with initial HEP for LUE coordination and R hand strength.--    Time 4    Period Weeks    Status Achieved   needs continued reinforcement, pt is not using consistently   Target Date 03/02/21      OT SHORT TERM GOAL #2   Title Pt/caregiver will be independent with vision HEP and visual compensation strategies.    Time 4    Period Weeks    Status Achieved   Pt/ family verbalize understanding of vision HEP, needs continued reinforcement of compensatory strategeis- pt is not performing head turns     OT SHORT TERM GOAL #3   Title Pt will improve LUE coordination for ADLs as shown by improving score on box and blocks test by at least 6.    Baseline L-21 blocks    Time 4    Period Weeks    Status On-going   04/27/21-26 for LUE increased by 5 blocks, (RUE 32)     OT SHORT TERM GOAL #4   Title Pt will perform simple snack prep/home maintenance task from w/c level mod I.    Time 4    Period Weeks    Status Achieved   not consistent 04/27/21     OT SHORT TERM GOAL #5   Title Pt will verbalize understanding of compensation strategies for sensory deficts for incr safety.    Time 4    Period Weeks    Status Achieved   Pt verbalized that he has to be careful about temperature, needs additional reinforcement.  03/12/21:  met     OT SHORT TERM GOAL #6   Title Pt will improve R  hand grip stength to at least 32lbs to assist with ADLs/opening containers.    Time 4    Period Weeks    Status Achieved   04/27/21-64.4, L  81.8     OT SHORT TERM GOAL #7   Title Pt will consistently navigate a busy environment at a walker level with no more than 2 v.c . for walker safety and avoiding items.    Time 4    Period Weeks    Status New      OT SHORT TERM GOAL #8   Title Pt will perform transitional movements modified independently  without LOB for increased safety with ADLS.    Time 4    Period Weeks    Status New               OT Long Term Goals - 05/17/21 1257       OT LONG TERM GOAL #1   Title Pt will be independent with updated HEP--    Time 10    Period Weeks    Status On-going      OT LONG TERM GOAL #2   Title Pt will improve L hand coordination for ADLs as shown by completing 9-hole peg test in less than 70sec.    Baseline 107sec    Time 10    Period Weeks    Status On-going   04/27/21 LUE-1 min 39 secs( RUE 35.16)     OT LONG TERM GOAL #3   Title Pt will be mod I with toileting.    Time 10    Period Weeks    Status Partially Met   met per pt report, however supervision recommended at htis time as pt balance appears worse since he took topamx, supervision recommended at this time10/31/22     OT LONG TERM GOAL #4   Title Pt will perform simple snack prep/home maintenance tasks in standing with supervision.    Time 10    Period Weeks    Status On-going   caregivers are performing     OT LONG TERM GOAL #5   Title Pt will demo good safety awareness/compensation for sensory deficits and attention to LUE during functional tasks and transfers without cueing.    Time 10    Period Weeks    Status On-going   04/17/21:  pt with difficulty/bumping into items with RW on L side at times, pt demo impulsivity at times with movement or with distractions with LUE movement (particularly with release of objects)     OT LONG TERM GOAL #6   Title Pt will  perform simple environmental scanning/navigation with at least 90% accuracy for incr safety without v.c to avoid bumping items    Time 10    Period Weeks    Status Revised   03/12/21:  70% with CGA for balance/ambulation.  04/17/21:  100% with scanning for objects, but needed close supervision/min cueing for safety/bumping into items on L side     OT LONG TERM GOAL #7   Title Pt will perform all basic ADLS with no more than supervision.    Time 10    Period Weeks    Status On-going   03/12/21:  min A-supervision.  04/17/21:  min A for boot, min A for shower transfer, supervision for tolieting                  Plan - 05/17/21 1253     Clinical Impression Statement Patient is planning to seek IP rehab in January in [place].    OT Occupational Profile and History Detailed Assessment- Review of Records and additional review of physical, cognitive, psychosocial history related to current functional performance  Occupational performance deficits (Please refer to evaluation for details): ADL's;IADL's;Work;Leisure;Social Participation    Body Structure / Function / Physical Skills ADL;Strength;Balance;Proprioception;UE functional use;IADL;Endurance;Vision;Mobility;Coordination;Decreased knowledge of precautions;FMC;GMC;Sensation    Cognitive Skills Attention;Safety Awareness;Memory;Perception;Problem Solve    Rehab Potential Good    Clinical Decision Making Several treatment options, min-mod task modification necessary    Comorbidities Affecting Occupational Performance: May have comorbidities impacting occupational performance    Modification or Assistance to Complete Evaluation  Min-Moderate modification of tasks or assist with assess necessary to complete eval    OT Frequency 2x / week    OT Duration --   10 weeks   OT Treatment/Interventions Self-care/ADL training;Moist Heat;Fluidtherapy;DME and/or AE instruction;Balance training;Therapeutic activities;Aquatic  Therapy;Ultrasound;Therapeutic exercise;Cognitive remediation/compensation;Visual/perceptual remediation/compensation;Functional Mobility Training;Neuromuscular education;Cryotherapy;Energy conservation;Manual Therapy;Patient/family education    Plan continues transfers/safety, environmental scanning/navigation, simple home maintenance- discussed with pt decreasing OT frequency to 1x week, pt to discuss with wife.    Recommended Other Services Pt reports he sees neuro optometry on 05/30/21    Consulted and Agree with Plan of Care Patient;Family member/caregiver    Family Member Consulted wife [name]             Patient will benefit from skilled therapeutic intervention in order to improve the following deficits and impairments:   Body Structure / Function / Physical Skills: ADL, Strength, Balance, Proprioception, UE functional use, IADL, Endurance, Vision, Mobility, Coordination, Decreased knowledge of precautions, [place], GMC, Sensation Cognitive Skills: Attention, Safety Awareness, Memory, Perception, Problem Solve     Visit Diagnosis: Abnormal posture  Muscle weakness (generalized)  Unsteadiness on feet  Ataxia  Other lack of coordination  Visuospatial deficit    Problem List Patient Active Problem List   Diagnosis Date Noted   Residual cognitive deficit as late effect of stroke 04/19/2021   Hemiparesis affecting right side as late effect of stroke ([place]) 04/19/2021   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 04/19/2021   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 ([place]) 04/19/2021   Thrombosis    Sleep disturbance    Dysphagia, post-stroke    PAF (paroxysmal atrial fibrillation) ([facility])    Acute pulmonary embolism without acute cor pulmonale ([facility])    Malnutrition of moderate degree 11/04/2020   Tracheostomy care ([place])    Pressure injury of skin 10/23/2020   Intracranial hemorrhage ([facility])    Atrial fibrillation ([facility])    Prediabetes    Leukocytosis     Acute blood loss anemia    Hypernatremia    ICH (intracerebral hemorrhage) ([place]) 09/25/2020   Unilateral primary osteoarthritis, left knee 09/26/2016   Atrial flutter ([place]) 09/25/2013   Near syncope 09/25/2013    Mariah Milling, OT/L 05/17/2021, 12:58 PM  [facility] [address] [place], [place], [address] Phone: [phone]   Fax:  [phone]  Name: Walter [name] MRN: [id number] Date of Birth: January 31, 1956

## 2021-05-17 NOTE — Therapy (Addendum)
[facility] [address] [place], [place], [address] Phone: [phone]   Fax:  [phone]  Speech Language Pathology Treatment/Recert  Patient Details  Name: Walter [name] MRN: [id number] Date of Birth: [birth year] Referring Provider (SLP): Charlett Blake, MD   Encounter Date: 05/17/2021   End of Session - 05/17/21 1239     Visit Number 35    Number of Visits 40    Date for SLP Re-Evaluation 95/18/84   re-cert extended for 1x/week for 4 weeks   Authorization Type Medicare    SLP Start Time 1234    SLP Stop Time  1315    SLP Time Calculation (min) 41 min    Activity Tolerance Patient tolerated treatment well             Past Medical History:  Diagnosis Date   Atrial flutter ([place])    s/p ablation   DVT (deep venous thrombosis) ([place])    Near syncope 09/25/2013   Paroxysmal atrial fibrillation ([place])    Stroke [facility])    initially hemorragic (not on [facility]) followed by subsequent embolic stroke    Past Surgical History:  Procedure Laterality Date   A-FLUTTER ABLATION N/A 03/09/2020   Procedure: A-FLUTTER ABLATION;  Surgeon: Thompson Grayer, MD;  Location: [facility] CV LAB;  Service: Cardiovascular;  Laterality: N/A;   CARDIOVERSION N/A 09/27/2013   Procedure: CARDIOVERSION;  Surgeon: Lelon Perla, MD;  Location: [facility] ENDOSCOPY;  Service: Cardiovascular;  Laterality: N/A;   IR ANGIO EXTERNAL CAROTID SEL EXT CAROTID UNI R MOD SED  09/26/2020   IR ANGIO INTRA EXTRACRAN SEL COM CAROTID INNOMINATE BILAT MOD SED  09/26/2020   IR ANGIO VERTEBRAL SEL VERTEBRAL UNI R MOD SED  09/26/2020   IR IVC FILTER PLMT / S&I /IMG GUID/MOD SED  10/08/2020   IR IVUS EACH ADDITIONAL NON CORONARY VESSEL  11/02/2020   IR PTA VENOUS EXCEPT DIALYSIS CIRCUIT  11/02/2020   IR RADIOLOGIST EVAL & MGMT  01/17/2021   IR THROMBECT VENO MECH MOD SED  10/27/2020   IR THROMBECT VENO MECH MOD SED  11/02/2020   IR US GUIDE VASC ACCESS LEFT  10/27/2020    IR US GUIDE VASC ACCESS RIGHT  09/26/2020   IR US GUIDE VASC ACCESS RIGHT  11/02/2020   IR VENO/EXT/UNI RIGHT  11/02/2020   IR VENOCAVAGRAM IVC  10/27/2020   RETINAL DETACHMENT SURGERY     right knee arthroscopy     TEE WITHOUT CARDIOVERSION N/A 09/27/2013   Procedure: TRANSESOPHAGEAL ECHOCARDIOGRAM (TEE);  Surgeon: Lelon Perla, MD;  Location: [facility] ENDOSCOPY;  Service: Cardiovascular;  Laterality: N/A;   WRIST SURGERY      There were no vitals filed for this visit.   Subjective Assessment - 05/17/21 1235     Subjective "It's going"    Currently in Pain? No/denies                   ADULT SLP TREATMENT - 05/17/21 1238       General Information   Behavior/Cognition Alert;Pleasant mood;Requires cueing;Cooperative      Treatment Provided   Treatment provided Cognitive-Linquistic      Cognitive-Linquistic Treatment   Treatment focused on Cognition;Patient/family/caregiver education    Skilled Treatment Pt reportedly managing home and work tasks well. Pt provided previous homework, in which pt noted to have difficulty completing sequencing and attention for mod complex task. Pt able to identify appropriate solution, which was effective to improve accuracy and performance. SLP  targeted executive functioning task, in which pt exhibited extended processing time but completed task accurately. Some inconsistency noted for systematic processing to identify correct answer via deductive reasoning, which SLP highlighted incongruities which could cause confusion. Pt verbalized understanding. Pt declined ST HEP at this time.      Assessment / Recommendations / Plan   Plan Continue with current plan of care;Goals updated      Progression Toward Goals   Progression toward goals Progressing toward goals              SLP Education - 05/17/21 1324     Education Details strategies to aid processing and thought organization    Person(s) Educated Patient    Methods  Explanation;Demonstration    Comprehension Verbalized understanding;Returned demonstration;Need further instruction              SLP Short Term Goals - 04/17/21 1709       SLP SHORT TERM GOAL #1   Title Pt will complete dysarthria HEP with occasional min A over 2 sessions    Status Not Met      SLP SHORT TERM GOAL #2   Title Pt will produce loud /a/ or "hey!" with at least high 80s dB average over 2 sessions    Status Not Met      SLP SHORT TERM GOAL #3   Title Pt will generate abdominal breathing in 16/20 answers to SLP questions over 2 sessions    Baseline 01-18-21    Status Partially Met      SLP SHORT TERM GOAL #4   Title Pt will demonstrate dysarthria compensations in 10 minute simple to mod complex conversation to be 75% intelligibile with occasional min A over 2 sessions    Status Not Met      SLP SHORT TERM GOAL #5   Title Pt will complete formal cognitive linguistic assessment in first 1-2 ST sessions    Status Achieved      SLP SHORT TERM GOAL #6   Title Pt will use memory and attention compensations for appointments, medicine management, and other daily activities with occasional min A over 2 sessions    Status Not Met              SLP Long Term Goals - 05/17/21 1301       SLP LONG TERM GOAL #1   Title Pt will complete dysarthria HEP with rare min A over 2 sessions    Baseline 03-20-21, 03-22-21    Status Achieved      SLP LONG TERM GOAL #2   Title Pt will demonstrate dysarthria compensations in 10 minute simple conversation to be 90% intelligibile with rare min A over 3 sessions    Baseline 03-22-21; 03-29-21, 04-03-21    Status Achieved      SLP LONG TERM GOAL #3   Title Pt will demo improved sustained and selective attention during therapeutic tasks with less than 3 cues for active listening during 2 sessions    Baseline 04-05-21    Status Achieved      SLP LONG TERM GOAL #4   Title Pt will demonstrate anticipatory awareness of impulsivity with min  cues to modify behaviors during 2 sessions    Baseline 04-11-21, 05-17-21    Status Achieved      SLP LONG TERM GOAL #5   Title Pt will report reduced frustration and improved communication effectiveness via PROM by 2 points at last ST sessions    Baseline CES: 15.5 &  V-RQOL:35    Time 4    Period Weeks    Status On-going    Target Date 06/15/21      Additional Long Term Goals   Additional Long Term Goals Yes      SLP LONG TERM GOAL #6   Title Pt will selectively attend to cognitive linguistic task for 5 minutes with 3 or less cues for redirection over 3 sessions    Baseline 04-03-21, 04-11-21    Status Achieved      SLP LONG TERM GOAL #7   Title Pt will identify errors and generate cognitive strategy to aid performance with rare min A over 3 sessions    Time 4    Period Weeks    Status New    Target Date 06/15/21              Plan - 05/17/21 1259     Clinical Impression Statement "[name]" presents for OPST intervention secondary to stroke in March 2022.  Pt agrees to cont cognition as pt endorses he has not yet returned to baseline. SLP targeted executive functioning, including attention to detail and problem solving for mod complex cognitive tasks. Pt aware of need to "develop a thought process" to aid success and performance. POC recert completed for 1x/week for 4 weeks. [name] would cont to benefit from skilled ST to address cognition to maximize communication effectiveness and optimize functional independence and safety at home.    Speech Therapy Frequency 1x /week    Duration 4 weeks   20 weeks   Treatment/Interventions Cueing hierarchy;Functional tasks;Patient/family education;Cognitive reorganization;Multimodal communcation approach;Language facilitation;Compensatory techniques;Internal/external aids;SLP instruction and feedback    Potential to Achieve Goals Good    Potential Considerations Severity of impairments    Consulted and Agree with Plan of Care Patient              Patient will benefit from skilled therapeutic intervention in order to improve the following deficits and impairments:   Cognitive communication deficit    Problem List Patient Active Problem List   Diagnosis Date Noted   Residual cognitive deficit as late effect of stroke 04/19/2021   Hemiparesis affecting right side as late effect of stroke ([place]) 04/19/2021   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 04/19/2021   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 ([place]) 04/19/2021   Thrombosis    Sleep disturbance    Dysphagia, post-stroke    PAF (paroxysmal atrial fibrillation) ([place])    Acute pulmonary embolism without acute cor pulmonale (HCC)    Malnutrition of moderate degree 11/04/2020   Tracheostomy care ([place])    Pressure injury of skin 10/23/2020   Intracranial hemorrhage ([facility])    Atrial fibrillation ([place])    Prediabetes    Leukocytosis    Acute blood loss anemia    Hypernatremia    ICH (intracerebral hemorrhage) ([place]) 09/25/2020   Unilateral primary osteoarthritis, left knee 09/26/2016   Atrial flutter ([place]) 09/25/2013   Near syncope 09/25/2013    Alinda Deem, [name] 05/17/2021, 1:25 PM  [facility] [address] [place], [place], [address] Phone: [phone]   Fax:  [phone]   Name: AMOND SPERANZA MRN: [id number] Date of Birth: 12-13-1955

## 2021-05-22 ENCOUNTER — Encounter: Payer: Self-pay | Admitting: Occupational Therapy

## 2021-05-22 ENCOUNTER — Other Ambulatory Visit: Payer: Self-pay

## 2021-05-22 ENCOUNTER — Ambulatory Visit: Payer: Medicare Other

## 2021-05-22 ENCOUNTER — Ambulatory Visit: Payer: Medicare Other | Admitting: Occupational Therapy

## 2021-05-22 VITALS — BP 132/77 | HR 77

## 2021-05-22 DIAGNOSIS — R41842 Visuospatial deficit: Secondary | ICD-10-CM

## 2021-05-22 DIAGNOSIS — R278 Other lack of coordination: Secondary | ICD-10-CM

## 2021-05-22 DIAGNOSIS — R293 Abnormal posture: Secondary | ICD-10-CM

## 2021-05-22 DIAGNOSIS — R262 Difficulty in walking, not elsewhere classified: Secondary | ICD-10-CM

## 2021-05-22 DIAGNOSIS — R270 Ataxia, unspecified: Secondary | ICD-10-CM

## 2021-05-22 DIAGNOSIS — R2681 Unsteadiness on feet: Secondary | ICD-10-CM

## 2021-05-22 DIAGNOSIS — R2689 Other abnormalities of gait and mobility: Secondary | ICD-10-CM

## 2021-05-22 DIAGNOSIS — R41844 Frontal lobe and executive function deficit: Secondary | ICD-10-CM

## 2021-05-22 DIAGNOSIS — M6281 Muscle weakness (generalized): Secondary | ICD-10-CM

## 2021-05-22 NOTE — Therapy (Signed)
[facility] [address] [place], [place], [address] Phone: [phone]   Fax:  [phone]  Occupational Therapy Treatment  Patient Details  Name: Walter [name] MRN: [id number] Date of Birth: 29-Dec-1955 Referring Provider (OT): Dr. Alysia Penna   Encounter Date: 05/22/2021   OT End of Session - 05/22/21 1107     Visit Number 37    Number of Visits 59    Date for OT Re-Evaluation 07/09/20    Authorization Type Medicare Part A & B **need to add KX**    Authorization - Visit Number 99    Authorization - Number of Visits 40    Progress Note Due on Visit 34    OT Start Time 1103    OT Stop Time 1141    OT Time Calculation (min) 38 min             Past Medical History:  Diagnosis Date   Atrial flutter ([place])    s/p ablation   DVT (deep venous thrombosis) ([place])    Near syncope 09/25/2013   Paroxysmal atrial fibrillation ([place])    Stroke [facility])    initially hemorragic (not on [facility]) followed by subsequent embolic stroke    Past Surgical History:  Procedure Laterality Date   A-FLUTTER ABLATION N/A 03/09/2020   Procedure: A-FLUTTER ABLATION;  Surgeon: Thompson Grayer, MD;  Location: [facility];  Service: Cardiovascular;  Laterality: N/A;   CARDIOVERSION N/A 09/27/2013   Procedure: CARDIOVERSION;  Surgeon: Lelon Perla, MD;  Location: [facility] ENDOSCOPY;  Service: Cardiovascular;  Laterality: N/A;   IR ANGIO EXTERNAL CAROTID SEL EXT CAROTID UNI R MOD SED  09/26/2020   IR ANGIO INTRA EXTRACRAN SEL COM CAROTID INNOMINATE BILAT MOD SED  09/26/2020   IR ANGIO VERTEBRAL SEL VERTEBRAL UNI R MOD SED  09/26/2020   IR IVC FILTER PLMT / S&I /IMG GUID/MOD SED  10/08/2020   IR IVUS EACH ADDITIONAL NON CORONARY VESSEL  11/02/2020   IR PTA VENOUS EXCEPT DIALYSIS CIRCUIT  11/02/2020   IR RADIOLOGIST EVAL & MGMT  01/17/2021   IR THROMBECT VENO MECH MOD SED  10/27/2020   IR THROMBECT VENO MECH MOD SED  11/02/2020   IR US GUIDE VASC  ACCESS LEFT  10/27/2020   IR US GUIDE VASC ACCESS RIGHT  09/26/2020   IR US GUIDE VASC ACCESS RIGHT  11/02/2020   IR VENO/EXT/UNI RIGHT  11/02/2020   IR VENOCAVAGRAM IVC  10/27/2020   RETINAL DETACHMENT SURGERY     right knee arthroscopy     TEE WITHOUT CARDIOVERSION N/A 09/27/2013   Procedure: TRANSESOPHAGEAL ECHOCARDIOGRAM (TEE);  Surgeon: Lelon Perla, MD;  Location: [facility] ENDOSCOPY;  Service: Cardiovascular;  Laterality: N/A;   WRIST SURGERY      Vitals:   05/22/21 1105 05/22/21 1130 05/22/21 1146  BP: 137/77  132/77  Pulse: 92 77   SpO2: 95% 96% 98%     Subjective Assessment - 05/22/21 1201     Subjective  Pt reports difficulty catching his breath    Pertinent History ICH of brainstem (complicated by hydrocephalus, DVTs, and tracheostomy).  PMH:  a-fib    Limitations fall risk, ataxia, impulsivity    Patient Stated Goals use L side of body better (decrease ataxia), stay active and heal brain    Currently in Pain? No/denies                   Treatment: Therapist  monitored vitals. Seated at table pt copied  a simple peg design with left and right UE's min-mod difficulty with LUE due to ataxia, only occasional min difficulty with RUE. Pt demonstrated good attention to task. Removing with improved in hand  manipulation, Standing at countertop to retrieve cones from cabinet to place on elevated surface min v.c , minguard for balance the replacing for standing balance and UE control. Pt reported he felt better at end of session.                OT Short Term Goals - 05/17/21 1257       OT SHORT TERM GOAL #1   Title Pt/caregiver will be independent with initial HEP for LUE coordination and R hand strength.--    Time 4    Period Weeks    Status Achieved   needs continued reinforcement, pt is not using consistently   Target Date 03/02/21      OT SHORT TERM GOAL #2   Title Pt/caregiver will be independent with vision HEP and visual compensation strategies.     Time 4    Period Weeks    Status Achieved   Pt/ family verbalize understanding of vision HEP, needs continued reinforcement of compensatory strategeis- pt is not performing head turns     OT SHORT TERM GOAL #3   Title Pt will improve LUE coordination for ADLs as shown by improving score on box and blocks test by at least 6.    Baseline L-21 blocks    Time 4    Period Weeks    Status On-going   04/27/21-26 for LUE increased by 5 blocks, (RUE 32)     OT SHORT TERM GOAL #4   Title Pt will perform simple snack prep/home maintenance task from w/c level mod I.    Time 4    Period Weeks    Status Achieved   not consistent 04/27/21     OT SHORT TERM GOAL #5   Title Pt will verbalize understanding of compensation strategies for sensory deficts for incr safety.    Time 4    Period Weeks    Status Achieved   Pt verbalized that he has to be careful about temperature, needs additional reinforcement.  03/12/21:  met     OT SHORT TERM GOAL #6   Title Pt will improve R hand grip stength to at least 32lbs to assist with ADLs/opening containers.    Time 4    Period Weeks    Status Achieved   04/27/21-64.4, L 81.8     OT SHORT TERM GOAL #7   Title Pt will consistently navigate a busy environment at a walker level with no more than 2 v.c . for walker safety and avoiding items.    Time 4    Period Weeks    Status New      OT SHORT TERM GOAL #8   Title Pt will perform transitional movements modified independently  without LOB for increased safety with ADLS.    Time 4    Period Weeks    Status New               OT Long Term Goals - 05/17/21 1257       OT LONG TERM GOAL #1   Title Pt will be independent with updated HEP--    Time 10    Period Weeks    Status On-going      OT LONG TERM GOAL #2   Title Pt will improve L hand coordination for  ADLs as shown by completing 9-hole peg test in less than 70sec.    Baseline 107sec    Time 10    Period Weeks    Status On-going   04/27/21  LUE-1 min 39 secs( RUE 35.16)     OT LONG TERM GOAL #3   Title Pt will be mod I with toileting.    Time 10    Period Weeks    Status Partially Met   met per pt report, however supervision recommended at htis time as pt balance appears worse since he took topamx, supervision recommended at this time10/31/22     OT LONG TERM GOAL #4   Title Pt will perform simple snack prep/home maintenance tasks in standing with supervision.    Time 10    Period Weeks    Status On-going   caregivers are performing     OT LONG TERM GOAL #5   Title Pt will demo good safety awareness/compensation for sensory deficits and attention to LUE during functional tasks and transfers without cueing.    Time 10    Period Weeks    Status On-going   04/17/21:  pt with difficulty/bumping into items with RW on L side at times, pt demo impulsivity at times with movement or with distractions with LUE movement (particularly with release of objects)     OT LONG TERM GOAL #6   Title Pt will perform simple environmental scanning/navigation with at least 90% accuracy for incr safety without v.c to avoid bumping items    Time 10    Period Weeks    Status Revised   03/12/21:  70% with CGA for balance/ambulation.  04/17/21:  100% with scanning for objects, but needed close supervision/min cueing for safety/bumping into items on L side     OT LONG TERM GOAL #7   Title Pt will perform all basic ADLS with no more than supervision.    Time 10    Period Weeks    Status On-going   03/12/21:  min A-supervision.  04/17/21:  min A for boot, min A for shower transfer, supervision for tolieting                  Plan - 05/22/21 1115     Clinical Impression Statement Pt arrived to occupational therapy reporting difficulty catching his breath. Pt's vitals were monitored, and appeared to improve throughout session. Pt/ caregiver Claiborne Billings were instructed to seek medical attention if pt develops new or worsening SOB. They verbalized  understanding.    OT Occupational Profile and History Detailed Assessment- Review of Records and additional review of physical, cognitive, psychosocial history related to current functional performance    Occupational performance deficits (Please refer to evaluation for details): ADL's;IADL's;Work;Leisure;Social Participation    Body Structure / Function / Physical Skills ADL;Strength;Balance;Proprioception;UE functional use;IADL;Endurance;Vision;Mobility;Coordination;Decreased knowledge of precautions;FMC;GMC;Sensation    Cognitive Skills Attention;Safety Awareness;Memory;Perception;Problem Solve    Rehab Potential Good    Clinical Decision Making Several treatment options, min-mod task modification necessary             Patient will benefit from skilled therapeutic intervention in order to improve the following deficits and impairments:   Body Structure / Function / Physical Skills: ADL, Strength, Balance, Proprioception, UE functional use, IADL, Endurance, Vision, Mobility, Coordination, Decreased knowledge of precautions, FMC, GMC, Sensation Cognitive Skills: Attention, Safety Awareness, Memory, Perception, Problem Solve     Visit Diagnosis: Abnormal posture  Muscle weakness (generalized)  Unsteadiness on feet  Other lack of coordination  Visuospatial deficit  Ataxia  Frontal lobe and executive function deficit    Problem List Patient Active Problem List   Diagnosis Date Noted   Residual cognitive deficit as late effect of stroke 04/19/2021   Hemiparesis affecting right side as late effect of stroke ([place]) 04/19/2021   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 04/19/2021   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 ([place]) 04/19/2021   Thrombosis    Sleep disturbance    Dysphagia, post-stroke    PAF (paroxysmal atrial fibrillation) ([place])    Acute pulmonary embolism without acute cor pulmonale (H[facility])    Malnutrition of moderate degree 11/04/2020    Tracheostomy care ([place])    Pressure injury of skin 10/23/2020   Intracranial hemorrhage ([facility])    Atrial fibrillation ([facility])    Prediabetes    Leukocytosis    Acute blood loss anemia    Hypernatremia    ICH (intracerebral hemorrhage) ([place]) 09/25/2020   Unilateral primary osteoarthritis, left knee 09/26/2016   Atrial flutter ([place]) 09/25/2013   Near syncope 09/25/2013    Minnah Llamas, OT/L 05/22/2021, 12:04 PM  [facility] [address] [place], [place], [address] Phone: [phone]   Fax:  [phone]  Name: RUBY DILONE MRN: [id number] Date of Birth: Mar 18, 1956

## 2021-05-22 NOTE — Therapy (Signed)
OUTPATIENT PHYSICAL THERAPY TREATMENT NOTE Patient Name: Walter [name] MRN: [id number] DOB:Jan 04, 1956, 65 y.o., male Today's Date: 05/22/2021  PCP: Alroy Dust, L.Marlou Sa, MD REFERRING PROVIDER: Alroy Dust, Carlean Jews.Marlou Sa, MD   See Note below for Objective Data and Assessment of Progress/Goals.      PT End of Session - 05/22/21 1022     Visit Number 43    Number of Visits 85    Date for PT Re-Evaluation 07/02/21    Authorization Type MCR    Authorization Time Period 04/28/21-07/03/21    Progress Note Due on Visit 56    PT Start Time 1015    PT Stop Time 1100    PT Time Calculation (min) 45 min    Equipment Utilized During Treatment Gait belt    Activity Tolerance Patient tolerated treatment well    Behavior During Therapy WFL for tasks assessed/performed                      Past Medical History:  Diagnosis Date   Atrial flutter ([place])    s/p ablation   DVT (deep venous thrombosis) (HCC)    Near syncope 09/25/2013   Paroxysmal atrial fibrillation ([place])    Stroke ([place])    initially hemorragic (not on [place]) followed by subsequent embolic stroke   Past Surgical History:  Procedure Laterality Date   A-FLUTTER ABLATION N/A 03/09/2020   Procedure: A-FLUTTER ABLATION;  Surgeon: Thompson Grayer, MD;  Location: [facility];  Service: Cardiovascular;  Laterality: N/A;   CARDIOVERSION N/A 09/27/2013   Procedure: CARDIOVERSION;  Surgeon: Lelon Perla, MD;  Location: [facility] ENDOSCOPY;  Service: Cardiovascular;  Laterality: N/A;   IR ANGIO EXTERNAL CAROTID SEL EXT CAROTID UNI R MOD SED  09/26/2020   IR ANGIO INTRA EXTRACRAN SEL COM CAROTID INNOMINATE BILAT MOD SED  09/26/2020   IR ANGIO VERTEBRAL SEL VERTEBRAL UNI R MOD SED  09/26/2020   IR IVC FILTER PLMT / S&I /IMG GUID/MOD SED  10/08/2020   IR IVUS EACH ADDITIONAL NON CORONARY VESSEL  11/02/2020   IR PTA VENOUS EXCEPT DIALYSIS CIRCUIT  11/02/2020   IR RADIOLOGIST EVAL & MGMT  01/17/2021   IR THROMBECT VENO MECH MOD SED  10/27/2020    IR THROMBECT VENO MECH MOD SED  11/02/2020   IR US GUIDE VASC ACCESS LEFT  10/27/2020   IR US GUIDE VASC ACCESS RIGHT  09/26/2020   IR US GUIDE VASC ACCESS RIGHT  11/02/2020   IR VENO/EXT/UNI RIGHT  11/02/2020   IR VENOCAVAGRAM IVC  10/27/2020   RETINAL DETACHMENT SURGERY     right knee arthroscopy     TEE WITHOUT CARDIOVERSION N/A 09/27/2013   Procedure: TRANSESOPHAGEAL ECHOCARDIOGRAM (TEE);  Surgeon: Lelon Perla, MD;  Location: [facility] ENDOSCOPY;  Service: Cardiovascular;  Laterality: N/A;   WRIST SURGERY     Patient Active Problem List   Diagnosis Date Noted   Residual cognitive deficit as late effect of stroke 04/19/2021   Hemiparesis affecting right side as late effect of stroke ([place]) 04/19/2021   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) 09/25/2020   Unilateral primary osteoarthritis, left knee 09/26/2016   Atrial flutter ([place]) 09/25/2013   Near syncope 09/25/2013  See Note below for Objective Data and Assessment of Progress/Goals.  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  Has had some breathing issues described as "ataxic", but denies SOB  TODAY'S  TREATMENT:   05/22/21   Scifit L4 seat 20, arms 7, L4  Ambulation of 162f with cane 3#, and 6# LLE, 2 trials Core tasks of 4KG ball transfers from floor to lap to [facility] hip tosses, shoulder tosses, all with 4 KG ball, 15x each STS with 4KG ball 15x    05/17/21 Ambulation trials of 1135fx3 with 6# weight on L ankle, once with rollator, once with RW and once with weighted cane(3#).  Some difficulty controlling rollator per patient admission.  Negotiation of 16 steps with B rails ascending with step through and descending step to sideways.  Fatigued after stairs.  Remaining time spent performing core tasks of 4KG ball transfers from floor to lap to [facility] tosses, shoulder tosses and Vs all with 4 KG ball   05/11/21  BERG score 41/56 in setting of ataxia with potential inter-rater reliability discrepancies possible  5x STS score 13.6s  Ambulation distance 50060fith RW and CGA requiring verbal and tactile cues for cadence, foot placement and postural correction  Scifit 8' L4 seat 20 arms 7  05/08/21  115f32fbulation with SPC weighted with 3#, 6# on LLE, 1 trip in each direction to perform R/L turning  In // bars stepping on 6" block  and raising arms OH, 10x each side with eight shifting onto step  1 minor LOB noted on each ambulation attempt with patient able to self correct given the opportunity  Extra time required during ambulation to allow for proper sequencing, cadence and balance correction   05/02/21 Pt seen for aquatic therapy today.  Treatment took place in water 3.25-4.8 ft in [facility] Temp of water was 92.Pt entered/exited the pool via stairs step to pattern independently with bilat rail.                           Warm up: in lap pool pt swam crawl stroke 1.5 laps with sup   Standing -step ups 70% submerged x15 R/L -side stepping onto water step R/L x 15. Cues wofr weight shift and maintaining balance/control. Slow pace. -step over (step) vc  slowing pace x 10 -High Knee Balance with Backward Walking with Hand Floats R/L -High Knee Balance with Forward Walking and Hand Floats R/L   Pt requires buoyancy for support and to offload joints with strengthening exercises. Viscosity of the water is needed for resistance of strengthening; water current perturbations provides challenge to standing balance unsupported, requiring increased core activation.    04/30/21 Seated placing and retrieving 4KG ball from floor to waist level 15x  Lifting 4 KG ball from waist level to OH 15x  Moving 4 KG ball from hip to hip 15x  [facility]  reaching against black t-band resistance Scifit seat 18, L5, arms 7 x 8 min  Ambulation in // bars, 5 trips fwd and retro, 5 trips sidestepping In // bars, stepping and WS onto 6" block 10x per leg   04/27/21  Seated placing and retrieving 4KG ball from floor to waist level 15x  Lifting 4 KG ball from waist level to OH 15x  Moving 4 KG ball from hip to hip 15x  STS with OH reaching against black t-band resistance Scifit seat 18, L4, arms 7 x 8 min  BERG score 32/56  04/24/21  Ambulation of 192f with 6# on LLE and 3# on cane, Min/ModA needed at times due to ataxia and possible lethargy from new meds, several LOB episodes noted requiring PT assist to prevent falls  Seated core exercises of hip/shoulder tosses, chops and Vs with 6# ball 10x each task  Sit to stand using green t-band for resisted OH reach once standing  Seated forward reaching to floor to retrieve 4KG ball and reach [facility]with it, 10x        04/13/21 Pt seen for aquatic therapy today.  Treatment took place in water 3.25-4.8 ft [facility] Temp of water was 92.Pt entered/exited the pool via stairs step to pattern independently with bilat rail.                           Amb progressed to  with and without hand buoys forward, backward (posterior chain activation) and side stepping/walking cues for increased step  length, posture and weight shifting x 4 lengths of pool. Pt requiring min assist with backward amb.   Standing -hip add/abd and extension x 15 reps holding to pool wall. VC for increases speed to increase resistance.   -hip extension 2 x 10 -step ups 70% submerged x15 R/L -step over (step) vc for placement and control of le/core x 10  Gait training 50% submerged cues for direct path, control of le/placement of feet.     Supine suspension Standing<>supine using 2 foam hand buoys x5 reps holding position x 10 second each. Vc and demo with min TC to gain position -in above position knees to chest with assist from therapist to maintain position 3 x 10 core strengthening   Aerobic capacity Swimming crawl and breast stroke 6 lengths of pool sba continuously.  VC and TC for straight path   Pt requires buoyancy for support and to offload joints with strengthening exercises. Viscosity of the water is needed for resistance of strengthening; water current perturbations provides challenge to standing balance unsupported, requiring increased core activation.     04/11/21  Seated on blue disk, forward reaching to floor wih 4KG ball to lap and repeated w/o ball to vary resistance levels and have patient adapt  Searwd on blue disk, lifting 4KG ball OH and repeating w/o ball to challenge core and adapt to varied resistance challenges   Seated LAQs on disk, 6# wts on L ankle, boot on R  with lat press alternating pattern, 1x15  Seated marching on disk, 6# wts on L ankle, boot on R, with lat press alternating pattern 1x15  15x STS performing OH press against red band once standing, CGA needed for stability  1182fambulation with 3# weighted cane, 6# on L ankle, requiring Min/ModA to prevent fall  11551fmbulaton with 6# weighted L ankle and RW with Min/ModA needed to stabilize  PATIENT EDUCATION: Education details: Scheduling more PT visits for October Person educated: Patient and  caregiver Education method: Explanation, Media planner, and Verbal cues Education comprehension: verbalized understanding     HOME EXERCISE PROGRAM: House ambulation with Cgs in RW/Consider membership to a pool.  ASSESSMENT:   CLINICAL IMPRESSION:  Somewhat flat affect today vs usual presentation.  Focus was ambulation with weighted cane and LLE with patient showing good control initially which degraded as patient fatigued.  REHAB POTENTIAL: Good   CLINICAL DECISION MAKING: Stable/uncomplicated  GOALS: Goals reviewed with patient? Yes   SHORT TERM GOALS:   STG Name Target Date Goal status  1 Patient will be able to perform chair to chair transfer with CGA/SBA with use of RW for safety to improve independence. Baseline: min to mod A (12/26/20); CGA/minA (03/09/21) 01/23/2021 03/27/21 Able to transfer STS and stand pivot with SBA, goal met  2 Pt will be able to ambulate 115' with RW with CGA to min A with RW for safety to improve independence Baseline: min to mod A with RW 145'; 230 feet with RW and CGA to MIN A 01/23/2021 03/09/21 Goal met  3 Pt will be able to maintain standing balance for 30 sec to improve static balance Baseline: 10 sec before needing help due to LOB (12/26/20) 01/23/2021 02/05/21 able to stand with S for 30s, goal met  4. Pt will be able to perform sit to stand transfers with SBA to improve independence 04/06/21 Goal met 03/27/21  5. Pt will be able to perform 230 feet of walking with RW with SBA/CGA to improve household ambulation  04/06/21 05/11/21 534f with RW and CGA, GOAL MET    LONG TERM GOALS:   LTG Name Target Date Goal status  1 Pt will demo >5 points improvement on Berg Balance Scale to improve static balance Baseline: 11/56 01/03/21 05/28/2021 05/11/21 BERG score 41/56  2 Pt will be able to ambulate >250 feet with cane and CGA to improve walking endurance and safety. Baseline: 145' with RW min to mod A; 230 feet with CGA to Min A(revised 05/11/21)  05/28/21  05/11/21 5038fwith RW and CGA  3 Pt  will be able to perform gait speed improvement by 0.1560mto improve community ambulation. Baseline:0.26m40m9/9/22) 05/28/21 Progressing continue 04/03/21 UTA due to achilles partial tear  4 Pt will demo 5 sec improvement in 5x sit to stand (with or without UE support) to improve functional strength Baseline: 5x sit to stand with bil UE 17 sec;  05/28/21 05/11/21 5x STS 13.6s    PLAN: PT FREQUENCY: 2-3x/week   PT DURATION: 8 weeks   PLANNED INTERVENTIONS: Aquatic therapy, Therapeutic exercises, Therapeutic activity, Neuro Muscular re-education, Balance training, Gait training, Patient/Family education, Joint mobilization, Stair training, Visual/preceptual remediation/compensation, Wheelchair mobility training, Cryotherapy, Moist heat, and Manual therapy   PLAN FOR NEXT SESSION:  Core and balance strengthening.  Reinforce transfer safety and proper technique, ambulation with weighted cane and LLE as appropriate, // bars ambulation and stepping tasks, balance challenges in staggered stance positions, stair training, pacing during cadence  Visit Diagnosis: No diagnosis found.     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 04/19/2021   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 (HCC)[place]/20/2022   Thrombosis    Sleep disturbance  Dysphagia, post-stroke    PAF (paroxysmal atrial fibrillation) (HCC)    Acute pulmonary embolism without acute cor pulmonale (HCC)    Malnutrition of moderate degree 11/04/2020   Tracheostomy care ([place])    Pressure injury of skin 10/23/2020   Intracranial hemorrhage (HCC)    Atrial fibrillation (HCC)    Prediabetes    Leukocytosis    Acute blood loss anemia    Hypernatremia    ICH  (intracerebral hemorrhage) ([place]) 09/25/2020   Unilateral primary osteoarthritis, left knee 09/26/2016   Atrial flutter ([place]) 09/25/2013   Near syncope 09/25/2013    Lanice Shirts, PT 05/22/2021, 10:28 AM  [facility] [address] [place], [place], [address] Phone: [phone]   Fax:  [facility] [address] [place], [place], [address] Phone: [phone]   Fax:  [phone]

## 2021-05-23 ENCOUNTER — Ambulatory Visit (HOSPITAL_BASED_OUTPATIENT_CLINIC_OR_DEPARTMENT_OTHER): Payer: Medicare Other | Admitting: Physical Therapy

## 2021-05-23 ENCOUNTER — Encounter (HOSPITAL_BASED_OUTPATIENT_CLINIC_OR_DEPARTMENT_OTHER): Payer: Self-pay | Admitting: Physical Therapy

## 2021-05-23 DIAGNOSIS — M6281 Muscle weakness (generalized): Secondary | ICD-10-CM

## 2021-05-23 DIAGNOSIS — R293 Abnormal posture: Secondary | ICD-10-CM | POA: Diagnosis not present

## 2021-05-23 DIAGNOSIS — R2681 Unsteadiness on feet: Secondary | ICD-10-CM

## 2021-05-23 DIAGNOSIS — R262 Difficulty in walking, not elsewhere classified: Secondary | ICD-10-CM

## 2021-05-23 DIAGNOSIS — G8929 Other chronic pain: Secondary | ICD-10-CM

## 2021-05-23 DIAGNOSIS — R2689 Other abnormalities of gait and mobility: Secondary | ICD-10-CM

## 2021-05-23 NOTE — Therapy (Signed)
OUTPATIENT PHYSICAL THERAPY TREATMENT NOTE Patient Name: Walter [name] MRN: [id number] DOB:23-May-1956, 65 y.o., male Today's Date: 05/23/2021  PCP: Alroy Dust, L.Marlou Sa, MD REFERRING PROVIDER: Alroy Dust, Carlean Jews.Marlou Sa, MD   See Note below for Objective Data and Assessment of Progress/Goals.      PT End of Session - 05/23/21 1554     Visit Number 44    Number of Visits 53    Date for PT Re-Evaluation 07/02/21    Authorization Type MCR    Authorization Time Period 04/28/21-07/03/21    Progress Note Due on Visit 42    PT Start Time 1547    PT Stop Time 1630    PT Time Calculation (min) 43 min    Equipment Utilized During Treatment Gait belt    Activity Tolerance Patient tolerated treatment well    Behavior During Therapy WFL for tasks assessed/performed                      Past Medical History:  Diagnosis Date   Atrial flutter ([place])    s/p ablation   DVT (deep venous thrombosis) (HCC)    Near syncope 09/25/2013   Paroxysmal atrial fibrillation ([place])    Stroke ([place])    initially hemorragic (not on [place]) followed by subsequent embolic stroke   Past Surgical History:  Procedure Laterality Date   A-FLUTTER ABLATION N/A 03/09/2020   Procedure: A-FLUTTER ABLATION;  Surgeon: Thompson Grayer, MD;  Location: [facility];  Service: Cardiovascular;  Laterality: N/A;   CARDIOVERSION N/A 09/27/2013   Procedure: CARDIOVERSION;  Surgeon: Lelon Perla, MD;  Location: [facility] ENDOSCOPY;  Service: Cardiovascular;  Laterality: N/A;   IR ANGIO EXTERNAL CAROTID SEL EXT CAROTID UNI R MOD SED  09/26/2020   IR ANGIO INTRA EXTRACRAN SEL COM CAROTID INNOMINATE BILAT MOD SED  09/26/2020   IR ANGIO VERTEBRAL SEL VERTEBRAL UNI R MOD SED  09/26/2020   IR IVC FILTER PLMT / S&I /IMG GUID/MOD SED  10/08/2020   IR IVUS EACH ADDITIONAL NON CORONARY VESSEL  11/02/2020   IR PTA VENOUS EXCEPT DIALYSIS CIRCUIT  11/02/2020   IR RADIOLOGIST EVAL & MGMT  01/17/2021   IR THROMBECT VENO MECH MOD SED  10/27/2020    IR THROMBECT VENO MECH MOD SED  11/02/2020   IR US GUIDE VASC ACCESS LEFT  10/27/2020   IR US GUIDE VASC ACCESS RIGHT  09/26/2020   IR US GUIDE VASC ACCESS RIGHT  11/02/2020   IR VENO/EXT/UNI RIGHT  11/02/2020   IR VENOCAVAGRAM IVC  10/27/2020   RETINAL DETACHMENT SURGERY     right knee arthroscopy     TEE WITHOUT CARDIOVERSION N/A 09/27/2013   Procedure: TRANSESOPHAGEAL ECHOCARDIOGRAM (TEE);  Surgeon: Lelon Perla, MD;  Location: [facility] ENDOSCOPY;  Service: Cardiovascular;  Laterality: N/A;   WRIST SURGERY     Patient Active Problem List   Diagnosis Date Noted   Residual cognitive deficit as late effect of stroke 04/19/2021   Hemiparesis affecting right side as late effect of stroke ([place]) 04/19/2021   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 04/19/2021   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 ([place]) 04/19/2021   Thrombosis    Sleep disturbance    Dysphagia, post-stroke    PAF (paroxysmal atrial fibrillation) ([place])    Acute pulmonary embolism without acute cor pulmonale (HCC)    Malnutrition of moderate degree 11/04/2020   Tracheostomy care ([place])    Pressure injury of skin 10/23/2020   Intracranial hemorrhage ([place])  Atrial fibrillation (HCC)    Prediabetes    Leukocytosis    Acute blood loss anemia    Hypernatremia    ICH (intracerebral hemorrhage) ([place]) 09/25/2020   Unilateral primary osteoarthritis, left knee 09/26/2016   Atrial flutter ([place]) 09/25/2013   Near syncope 09/25/2013  See Note below for Objective Data and Assessment of Progress/Goals.  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  Has had some breathing issues described as "ataxic", but denies SOB  TODAY'S  TREATMENT:   11/23  Pt seen for aquatic therapy today.  Treatment took place in water 3.25-4.8 ft in depth at the [facility] pool. Temp of water was 92.Pt entered/exited the pool via stairs step to pattern independently with bilat rail.                           Warm up: walking multiple laps without ue support forward, back and side stepping   Exercises Pilates modified planks using yellow noodle Lat pull downs 2 x 10. Cues  and manual assist for core control, improved rue movement  Attempted plank without success. Pt unable to hold position due to right sided weakness. -step ups 70% submerged x15 over step, turning to return over step.  Cues for control with 180 degree turns -side stepping onto water step R and L over step changing directions -step over (step) vc slowing pace x 10  Warm down 4 laps (in therapy pool) crawl stroke 2 laps breast.   Pt requires buoyancy for support and to offload joints with strengthening exercises. Viscosity of the water is needed for resistance of strengthening; water current perturbations provides challenge to standing balance unsupported, requiring increased core activation.   05/22/21   Scifit L4 seat 20, arms 7, L4  Ambulation of 160f with cane 3#, and 6# LLE, 2 trials Core tasks of 4KG ball transfers from floor to lap to [facility] hip toss[facility], shoulder tosses, all with 4 KG ball, 15x each STS with 4KG ball 15x    05/17/21 Ambulation trials of 1153fx3 with 6# weight on L ankle, once with rollator, once with RW and once with weighted cane(3#).  Some difficulty controlling rollator per patient admission.  Negotiation of 16 steps with B rails ascending with step through and descending step to sideways.  Fatigued after stairs.  Remaining time spent performing core tasks of 4KG ball transfers from floor to lap to [facility], shoulder tosses and Vs all with 4 KG ball   05/11/21  BERG score 41/56 in setting of ataxia with potential inter-rater  reliability discrepancies possible  5x STS score 13.6s  Ambulation distance 50067fith RW and CGA requiring verbal and tactile cues for cadence, foot placement and postural correction  Scifit 8' L4 seat 20 arms 7  05/08/21  115f53fbulation with SPC weighted with 3#, 6# on LLE, 1 trip in each direction to perform R/L turning  In // bars stepping on 6" block  and raising arms OH, 10x each side with eight shifting onto step  1 minor LOB noted on each ambulation attempt with patient able to self correct given the opportunity  Extra time required during ambulation to allow for proper sequencing, cadence and balance correction  05/02/21 Pt seen for aquatic therapy today.  Treatment took place in water 3.25-4.8 ft in depth at the [facility] pool. Temp of water was 92.Pt entered/exited the pool via stairs step to pattern independently with bilat rail.                           Warm up: in lap pool pt swam crawl stroke 1.5 laps with sup   Standing -step ups 70% submerged x15 R/L -side stepping onto water step R/L x 15. Cues wofr weight shift and maintaining balance/control. Slow pace. -step over (step) vc slowing pace x 10 -High Knee Balance with Backward Walking with Hand Floats R/L -High Knee Balance with Forward Walking and Hand Floats R/L   Pt requires buoyancy for support and to offload joints with strengthening exercises. Viscosity of the water is needed for resistance of strengthening; water current perturbations provides challenge to standing balance unsupported, requiring increased core activation.    04/30/21 Seated placing and retrieving 4KG ball from floor to waist level 15x  Lifting 4 KG ball from waist level to OH 15x  Moving 4 KG ball from hip to hip 15x  STS with OH reaching against black t-band resistance Scifit seat 18, L5, arms 7 x 8 min  Ambulation in // bars, 5 trips fwd and retro, 5 trips sidestepping In // bars, stepping and WS onto 6" block 10x per leg     PATIENT EDUCATION: Education details: Scheduling more PT visits for October Person educated: Patient and caregiver Education method: Consulting civil engineer, Media planner, and Verbal cues Education comprehension: verbalized understanding     HOME EXERCISE PROGRAM: House ambulation with Cgs in RW/Consider membership to a pool.  ASSESSMENT:   CLINICAL IMPRESSION:  Introduced water pilates positioning with attempts to increase core activation, improve core control. Pt with difficulty due to strength differences R<>L. With practice pt improves although has difficulty focusing on target movements individually to gain core control.  No significant improvements in ataxia and balance ability with step ups.  Pt does lose balance but recovers indep due to good swimming ability regaining postioning. Does demonstrate improving ability with swimming crawl and breast stroke with more even R<>L UE coordination. Beginning to see pt reaching maximal potential in aquatic setting. He works well in pool environment due to his background in swimming although needs assistance for safety while completing.     REHAB POTENTIAL: Good   CLINICAL DECISION MAKING: Stable/uncomplicated  GOALS: Goals reviewed with patient? Yes   SHORT TERM GOALS:   STG Name Target Date Goal status  1 Patient will be able to perform chair to chair transfer with CGA/SBA with use of RW for safety to improve independence. Baseline: min to mod A (12/26/20); CGA/minA (03/09/21) 01/23/2021 03/27/21 Able to transfer STS and stand pivot with SBA, goal met  2 Pt will be able to ambulate 115' with RW with CGA to min A with RW for safety to improve independence Baseline: min to mod A with RW 145'; 230 feet with RW and CGA to MIN A 01/23/2021 03/09/21 Goal met  3 Pt will be able to maintain standing balance for 30 sec to improve static balance Baseline: 10 sec before needing help due to LOB (12/26/20) 01/23/2021 02/05/21 able to stand with S for 30s, goal met  4. Pt  will be able to perform sit to stand transfers with SBA to improve independence 04/06/21 Goal met 03/27/21  5. Pt will be able to  perform 230 feet of walking with RW with SBA/CGA to improve household ambulation  04/06/21 05/11/21 53f with RW and CGA, GOAL MET    LONG TERM GOALS:   LTG Name Target Date Goal status  1 Pt will demo >5 points improvement on Berg Balance Scale to improve static balance Baseline: 11/56 01/03/21 05/28/2021 05/11/21 BERG score 41/56  2 Pt will be able to ambulate >250 feet with cane and CGA to improve walking endurance and safety. Baseline: 145' with RW min to mod A; 230 feet with CGA to Min A(revised 05/11/21)  05/28/21 05/11/21 5066fwith RW and CGA  3 Pt  will be able to perform gait speed improvement by 0.1565mto improve community ambulation. Baseline:0.70m46m9/9/22) 05/28/21 Progressing continue 04/03/21 UTA due to achilles partial tear  4 Pt will demo 5 sec improvement in 5x sit to stand (with or without UE support) to improve functional strength Baseline: 5x sit to stand with bil UE 17 sec;  05/28/21 05/11/21 5x STS 13.6s    PLAN: PT FREQUENCY: 2-3x/week   PT DURATION: 8 weeks   PLANNED INTERVENTIONS: Aquatic therapy, Therapeutic exercises, Therapeutic activity, Neuro Muscular re-education, Balance training, Gait training, Patient/Family education, Joint mobilization, Stair training, Visual/preceptual remediation/compensation, Wheelchair mobility training, Cryotherapy, Moist heat, and Manual therapy   PLAN FOR NEXT SESSION:  Core and balance strengthening.  Reinforce transfer safety and proper technique, ambulation with weighted cane and LLE as appropriate, // bars ambulation and stepping tasks, balance challenges in staggered stance positions, stair training, pacing during cadence  Visit Diagnosis: Abnormal posture  Other abnormalities of gait and mobility  Unsteadiness on feet  Chronic pain of right knee  Difficulty in walking, not elsewhere  classified  Muscle weakness (generalized)     Problem List Patient Active Problem List   Diagnosis Date Noted   Residual cognitive deficit as late effect of stroke 04/19/2021   Hemiparesis affecting right side as late effect of stroke (HCC)[place]/20/2022   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 04/19/2021   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 (HCC)[place]/20/2022   Thrombosis    Sleep disturbance    Dysphagia, post-stroke    PAF (paroxysmal atrial fibrillation) (HCC)[place] Acute pulmonary embolism without acute cor pulmonale (HCC)[place] Malnutrition of moderate degree 11/04/2020   Tracheostomy care (HCC)[place] Pressure injury of skin 10/23/2020   Intracranial hemorrhage (HCC)    Atrial fibrillation (HCC)[place] Prediabetes    Leukocytosis    Acute blood loss anemia    Hypernatremia    ICH (intracerebral hemorrhage) (HCC)[place]/28/2022   Unilateral primary osteoarthritis, left knee 09/26/2016   Atrial flutter (HCC)[place]/28/2015   Near syncope 09/25/2013    MaryVedia PereyraT 05/23/2021, 6:26 PM  [facility] [address], [place]ne: [phone]:  [address], [place]ne: [phone]:  [phone]

## 2021-05-23 NOTE — Therapy (Signed)
[facility] [address] [place], [place], [address] Phone: [phone]   Fax:  [phone]  Physical Therapy Treatment  Patient Details  Name: Walter [name] MRN: [id number] Date of Birth: 1955-12-20 No data recorded  Encounter Date: 05/23/2021   PT End of Session - 05/23/21 1554     Visit Number 44    Number of Visits 44    Date for PT Re-Evaluation 07/02/21    Authorization Type MCR    Authorization Time Period 04/28/21-07/03/21    Progress Note Due on Visit 40    PT Start Time 1547    PT Stop Time 1630    PT Time Calculation (min) 43 min    Equipment Utilized During Treatment Gait belt    Activity Tolerance Patient tolerated treatment well    Behavior During Therapy [facility] for tasks assessed/performed             Past Medical History:  Diagnosis Date   Atrial flutter (HCC)    s/p ablation   DVT (deep venous thrombosis) (HCC)    Near syncope 09/25/2013   Paroxysmal atrial fibrillation (HCC)    Stroke (HCC)    initially hemorragic (not on OAC) followed by subsequent embolic stroke    Past Surgical History:  Procedure Laterality Date   A-FLUTTER ABLATION N/A 03/09/2020   Procedure: A-FLUTTER ABLATION;  Surgeon: Hillis Range, MD;  Location: [facility];  Service: Cardiovascular;  Laterality: N/A;   CARDIOVERSION N/A 09/27/2013   Procedure: CARDIOVERSION;  Surgeon: Lewayne Bunting, MD;  Location: [facility] ENDOSCOPY;  Service: Cardiovascular;  Laterality: N/A;   IR ANGIO EXTERNAL CAROTID SEL EXT CAROTID UNI R MOD SED  09/26/2020   IR ANGIO INTRA EXTRACRAN SEL COM CAROTID INNOMINATE BILAT MOD SED  09/26/2020   IR ANGIO VERTEBRAL SEL VERTEBRAL UNI R MOD SED  09/26/2020   IR IVC FILTER PLMT / S&I /IMG GUID/MOD SED  10/08/2020   IR IVUS EACH ADDITIONAL NON CORONARY VESSEL  11/02/2020   IR PTA VENOUS EXCEPT DIALYSIS CIRCUIT  11/02/2020   IR RADIOLOGIST EVAL & MGMT  01/17/2021   IR THROMBECT VENO MECH MOD SED  10/27/2020   IR  THROMBECT VENO MECH MOD SED  11/02/2020   IR US GUIDE VASC ACCESS LEFT  10/27/2020   IR US GUIDE VASC ACCESS RIGHT  09/26/2020   IR US GUIDE VASC ACCESS RIGHT  11/02/2020   IR VENO/EXT/UNI RIGHT  11/02/2020   IR VENOCAVAGRAM IVC  10/27/2020   RETINAL DETACHMENT SURGERY     right knee arthroscopy     TEE WITHOUT CARDIOVERSION N/A 09/27/2013   Procedure: TRANSESOPHAGEAL ECHOCARDIOGRAM (TEE);  Surgeon: Lewayne Bunting, MD;  Location: [facility] ENDOSCOPY;  Service: Cardiovascular;  Laterality: N/A;   WRIST SURGERY      There were no vitals filed for this visit.                                            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 04/19/2021   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 ([place]) 04/19/2021   Thrombosis    Sleep disturbance    Dysphagia, post-stroke    PAF (paroxysmal atrial fibrillation) ([place])    Acute pulmonary embolism without acute cor pulmonale (HCC)    Malnutrition of moderate degree 11/04/2020   Tracheostomy care ([place])    Pressure injury of skin 10/23/2020   Intracranial hemorrhage (HCC)    Atrial fibrillation (HCC)    Prediabetes    Leukocytosis    Acute blood loss anemia    Hypernatremia    ICH (intracerebral hemorrhage) ([place]) 09/25/2020   Unilateral primary osteoarthritis, left knee 09/26/2016   Atrial flutter ([place]) 09/25/2013   Near syncope 09/25/2013     Vedia Pereyra, PT 05/23/2021, 6:25 PM  [facility] [address] [place], [place], [address] Phone: [phone]   Fax:  [phone]  Name: Walter [name] MRN: [id number] Date of Birth: 1955-07-16

## 2021-05-29 ENCOUNTER — Encounter: Payer: Self-pay | Admitting: Internal Medicine

## 2021-05-29 ENCOUNTER — Encounter: Payer: Medicare Other | Admitting: Occupational Therapy

## 2021-05-31 ENCOUNTER — Encounter: Payer: Self-pay | Admitting: Occupational Therapy

## 2021-05-31 ENCOUNTER — Ambulatory Visit: Payer: Medicare Other | Attending: Family Medicine

## 2021-05-31 ENCOUNTER — Other Ambulatory Visit: Payer: Self-pay

## 2021-05-31 ENCOUNTER — Ambulatory Visit: Payer: Medicare Other

## 2021-05-31 ENCOUNTER — Ambulatory Visit: Payer: Medicare Other | Admitting: Occupational Therapy

## 2021-05-31 VITALS — BP 125/79 | HR 90

## 2021-05-31 DIAGNOSIS — R270 Ataxia, unspecified: Secondary | ICD-10-CM | POA: Insufficient documentation

## 2021-05-31 DIAGNOSIS — R41841 Cognitive communication deficit: Secondary | ICD-10-CM

## 2021-05-31 DIAGNOSIS — G8929 Other chronic pain: Secondary | ICD-10-CM | POA: Insufficient documentation

## 2021-05-31 DIAGNOSIS — R41842 Visuospatial deficit: Secondary | ICD-10-CM

## 2021-05-31 DIAGNOSIS — R278 Other lack of coordination: Secondary | ICD-10-CM | POA: Insufficient documentation

## 2021-05-31 DIAGNOSIS — R2681 Unsteadiness on feet: Secondary | ICD-10-CM | POA: Insufficient documentation

## 2021-05-31 DIAGNOSIS — R293 Abnormal posture: Secondary | ICD-10-CM | POA: Insufficient documentation

## 2021-05-31 DIAGNOSIS — R2689 Other abnormalities of gait and mobility: Secondary | ICD-10-CM

## 2021-05-31 DIAGNOSIS — R262 Difficulty in walking, not elsewhere classified: Secondary | ICD-10-CM

## 2021-05-31 DIAGNOSIS — R471 Dysarthria and anarthria: Secondary | ICD-10-CM | POA: Diagnosis present

## 2021-05-31 DIAGNOSIS — M25561 Pain in right knee: Secondary | ICD-10-CM

## 2021-05-31 DIAGNOSIS — M6281 Muscle weakness (generalized): Secondary | ICD-10-CM

## 2021-05-31 DIAGNOSIS — R41844 Frontal lobe and executive function deficit: Secondary | ICD-10-CM | POA: Insufficient documentation

## 2021-05-31 NOTE — Therapy (Signed)
OUTPATIENT PHYSICAL THERAPY TREATMENT NOTE Patient Name: Walter [name] MRN: [id number] DOB:11/12/1955, 65 y.o., male Today's Date: 05/31/2021  PCP: Alroy Dust, L.Marlou Sa, MD REFERRING PROVIDER: Alroy Dust, Carlean Jews.Marlou Sa, MD   See Note below for Objective Data and Assessment of Progress/Goals.      PT End of Session - 05/31/21 1153     Visit Number 45    Number of Visits 50    Date for PT Re-Evaluation 07/02/21    Authorization Type MCR    Authorization Time Period 04/28/21-07/03/21    Progress Note Due on Visit 53    PT Start Time 1145    PT Stop Time 1230    PT Time Calculation (min) 45 min    Equipment Utilized During Treatment Gait belt    Activity Tolerance Patient tolerated treatment well    Behavior During Therapy WFL for tasks assessed/performed                      Past Medical History:  Diagnosis Date   Atrial flutter ([place])    s/p ablation   DVT (deep venous thrombosis) (HCC)    Near syncope 09/25/2013   Paroxysmal atrial fibrillation (HCC)    Stroke ([facility])    initially hemorragic (not on [place]) followed by subsequent embolic stroke   Past Surgical History:  Procedure Laterality Date   A-FLUTTER ABLATION N/A 03/09/2020   Procedure: A-FLUTTER ABLATION;  Surgeon: Thompson Grayer, MD;  Location: [facility] [facility];  Service: Cardiovascular;  Laterality: N/A;   CARDIOVERSION N/A 09/27/2013   Procedure: CARDIOVERSION;  Surgeon: Lelon Perla, MD;  Location: [facility] ENDOSCOPY;  Service: Cardiovascular;  Laterality: N/A;   IR ANGIO EXTERNAL CAROTID SEL EXT CAROTID UNI R MOD SED  09/26/2020   IR ANGIO INTRA EXTRACRAN SEL COM CAROTID INNOMINATE BILAT MOD SED  09/26/2020   IR ANGIO VERTEBRAL SEL VERTEBRAL UNI R MOD SED  09/26/2020   IR IVC FILTER PLMT / S&I /IMG GUID/MOD SED  10/08/2020   IR IVUS EACH ADDITIONAL NON CORONARY VESSEL  11/02/2020   IR PTA VENOUS EXCEPT DIALYSIS CIRCUIT  11/02/2020   IR RADIOLOGIST EVAL & MGMT  01/17/2021   IR THROMBECT VENO MECH MOD SED  10/27/2020    IR THROMBECT VENO MECH MOD SED  11/02/2020   IR US GUIDE VASC ACCESS LEFT  10/27/2020   IR US GUIDE VASC ACCESS RIGHT  09/26/2020   IR US GUIDE VASC ACCESS RIGHT  11/02/2020   IR VENO/EXT/UNI RIGHT  11/02/2020   IR VENOCAVAGRAM IVC  10/27/2020   RETINAL DETACHMENT SURGERY     right knee arthroscopy     TEE WITHOUT CARDIOVERSION N/A 09/27/2013   Procedure: TRANSESOPHAGEAL ECHOCARDIOGRAM (TEE);  Surgeon: Lelon Perla, MD;  Location: [facility] ENDOSCOPY;  Service: Cardiovascular;  Laterality: N/A;   WRIST SURGERY     Patient Active Problem List   Diagnosis Date Noted   Residual cognitive deficit as late effect of stroke 04/19/2021   Hemiparesis affecting right side as late effect of stroke ([place]) 04/19/2021   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 04/19/2021   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 ([place]) 04/19/2021   Thrombosis    Sleep disturbance    Dysphagia, post-stroke    PAF (paroxysmal atrial fibrillation) ([place])    Acute pulmonary embolism without acute cor pulmonale (HCC)    Malnutrition of moderate degree 11/04/2020   Tracheostomy care ([place])    Pressure injury of skin 10/23/2020   Intracranial hemorrhage ([place])  Atrial fibrillation (HCC)    Prediabetes    Leukocytosis    Acute blood loss anemia    Hypernatremia    ICH (intracerebral hemorrhage) ([place]) 09/25/2020   Unilateral primary osteoarthritis, left knee 09/26/2016   Atrial flutter ([place]) 09/25/2013   Near syncope 09/25/2013  See Note below for Objective Data and Assessment of Progress/Goals.  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  Has been accepted to [facility]  and will begin in January.  [facility] to leave early due to sleep study  TODAY'S TREATMENT:   05/31/21  Ambulation of 174f with cane 3#, and 6# LLE, 1 trial due to marked balance issues Core tasks of 4KG ball transfers from floor to lap to [facility], shoulder tosses, all with 4 KG ball, 15x each STS with 4KG ball 15x   05/22/21   Scifit L4 seat 20, arms 7, L4  Ambulation of 13104fwith cane 3#, and 6# LLE, 2 trials Core tasks of 4KG ball transfers from floor to lap to O[facility]hip tosses, shoulder tosses, all with 4 KG ball, 15x each STS with 4KG ball 15x    05/17/21 Ambulation trials of 11564f3 with 6# weight on L ankle, once with rollator, once with RW and once with weighted cane(3#).  Some difficulty controlling rollator per patient admission.  Negotiation of 16 steps with B rails ascending with step through and descending step to sideways.  Fatigued after stairs.  Remaining time spent performing core tasks of 4KG ball transfers from floor to lap to OH,[facility]ip tosses, shoulder tosses and Vs all with 4 KG ball   05/11/21  BERG score 41/56 in setting of ataxia with potential inter-rater reliability discrepancies possible  5x STS score 13.6s  Ambulation distance 500f26fth RW and CGA requiring verbal and tactile cues for cadence, foot placement and postural correction  Scifit 8' L4 seat 20 arms 7  05/08/21  115ft68fulation with SPC weighted with 3#, 6# on LLE, 1 trip in each direction to perform R/L turning  In // bars stepping on 6" block  and raising arms OH, 10x each side with eight shifting onto step  1 minor LOB noted on each ambulation attempt with patient able to self correct given the opportunity  Extra time required during ambulation to allow for proper sequencing, cadence and balance correction   05/02/21 Pt seen for aquatic therapy today.  Treatment took place in water 3.25-4.8 ft in depth at the [facility]. Temp of water was 92.Pt entered/exited the pool via stairs  step to pattern independently with bilat rail.                           Warm up: in lap pool pt swam crawl stroke 1.5 laps with sup   Standing -step ups 70% submerged x15 R/L -side stepping onto water step R/L x 15. Cues wofr weight shift and maintaining balance/control. Slow pace. -step over (step) vc slowing pace x 10 -High Knee Balance with Backward Walking with Hand Floats R/L -High Knee Balance with Forward Walking and Hand Floats R/L   Pt requires buoyancy for support and to offload joints with strengthening exercises. Viscosity of the water  is needed for resistance of strengthening; water current perturbations provides challenge to standing balance unsupported, requiring increased core activation.    04/30/21 Seated placing and retrieving 4KG ball from floor to waist level 15x  Lifting 4 KG ball from waist level to OH 15x  Moving 4 KG ball from hip to hip 15x  STS with OH reaching against black t-band resistance Scifit seat 18, L5, arms 7 x 8 min  Ambulation in // bars, 5 trips fwd and retro, 5 trips sidestepping In // bars, stepping and WS onto 6" block 10x per leg   04/27/21  Seated placing and retrieving 4KG ball from floor to waist level 15x  Lifting 4 KG ball from waist level to OH 15x  Moving 4 KG ball from hip to hip 15x  STS with OH reaching against black t-band resistance Scifit seat 18, L4, arms 7 x 8 min  BERG score 32/56  04/24/21  Ambulation of 186f with 6# on LLE and 3# on cane, Min/ModA needed at times due to ataxia and possible lethargy from new meds, several LOB episodes noted requiring PT assist to prevent falls  Seated core exercises of hip/shoulder tosses, chops and Vs with 6# ball 10x each task  Sit to stand using green t-band for resisted OH reach once standing  Seated forward reaching to floor to retrieve 4KG ball and [facility]with it, 10x        04/13/21 Pt seen for aquatic therapy today.  Treatment took place in water 3.25-4.8 ft [facility] Temp of water was 92.Pt entered/exited the pool via stairs step to pattern independently with bilat rail.                           Amb progressed to  with and without hand buoys forward, backward (posterior chain activation) and side stepping/walking cues for increased step length, posture and weight shifting x 4 lengths of pool. Pt requiring min assist with backward amb.   Standing -hip add/abd and extension x 15 reps holding to pool wall. VC for increases speed to increase resistance.   -hip extension 2 x 10 -step ups 70% submerged x15 R/L -step over (step) vc for placement and control of le/core x 10  Gait training 50% submerged cues for direct path, control of le/placement of feet.     Supine suspension Standing<>supine using 2 foam hand buoys x5 reps holding position x 10 second each. Vc and demo with min TC to gain position -in above position knees to chest with assist from therapist to maintain position 3 x 10 core strengthening   Aerobic capacity Swimming crawl and breast stroke 6 lengths of pool sba continuously.  VC and TC for straight path   Pt requires buoyancy for support and to offload joints with strengthening exercises. Viscosity of the water is needed for resistance of strengthening; water current perturbations provides challenge to standing balance unsupported, requiring increased core activation.     04/11/21  Seated on blue disk, forward reaching to floor wih 4KG ball to lap and repeated w/o ball to vary resistance levels and have patient adapt  Searwd on blue disk, lifting 4KG ball OH and repeating w/o ball to challenge core and adapt to varied resistance challenges   Seated LAQs on disk, 6# wts on L ankle, boot on R  with lat press alternating pattern, 1x15  Seated marching on disk, 6# wts on L  ankle, boot on R, with lat press alternating pattern 1x15  15x STS performing OH press against red band once standing, CGA needed for  stability  132f ambulation with 3# weighted cane, 6# on L ankle, requiring Min/ModA to prevent fall  1171fambulaton with 6# weighted L ankle and RW with Min/ModA needed to stabilize     PATIENT EDUCATION: Education details: Scheduling more PT visits for October Person educated: Patient and caregiver Education method: ExConsulting civil engineerDeMedia plannerand Verbal cues Education comprehension: verbalized understanding     HOME EXERCISE PROGRAM: House ambulation with Cgs in RW/Consider membership to a pool.  ASSESSMENT:   CLINICAL IMPRESSION:  Very unsteady on feet today and acknowledged it himself.  3 LOB episodes note with need of PT to prevent fall.  Remaind session spent seated due to fall risk.  REHAB POTENTIAL: Good   CLINICAL DECISION MAKING: Stable/uncomplicated  GOALS: Goals reviewed with patient? Yes   SHORT TERM GOALS:   STG Name Target Date Goal status  1 Patient will be able to perform chair to chair transfer with CGA/SBA with use of RW for safety to improve independence. Baseline: min to mod A (12/26/20); CGA/minA (03/09/21) 01/23/2021 03/27/21 Able to transfer STS and stand pivot with SBA, goal met  2 Pt will be able to ambulate 115' with RW with CGA to min A with RW for safety to improve independence Baseline: min to mod A with RW 145'; 230 feet with RW and CGA to MIN A 01/23/2021 03/09/21 Goal met  3 Pt will be able to maintain standing balance for 30 sec to improve static balance Baseline: 10 sec before needing help due to LOB (12/26/20) 01/23/2021 02/05/21 able to stand with S for 30s, goal met  4. Pt will be able to perform sit to stand transfers with SBA to improve independence 04/06/21 Goal met 03/27/21  5. Pt will be able to perform 230 feet of walking with RW with SBA/CGA to improve household ambulation  04/06/21 05/11/21 50052fith RW and CGA, GOAL MET    LONG TERM GOALS:   LTG Name Target Date Goal status  1 Pt will demo >5 points improvement on Berg Balance Scale to  improve static balance Baseline: 11/56 01/03/21 05/28/2021 05/11/21 BERG score 41/56  2 Pt will be able to ambulate >250 feet with cane and CGA to improve walking endurance and safety. Baseline: 145' with RW min to mod A; 230 feet with CGA to Min A(revised 05/11/21)  05/28/21 05/11/21 500f25fth RW and CGA  3 Pt  will be able to perform gait speed improvement by 0.13m/43m improve community ambulation. Baseline:0.19m/s34m9/22) 05/28/21 Progressing continue 04/03/21 UTA due to achilles partial tear  4 Pt will demo 5 sec improvement in 5x sit to stand (with or without UE support) to improve functional strength Baseline: 5x sit to stand with bil UE 17 sec;  05/28/21 05/11/21 5x STS 13.6s    PLAN: PT FREQUENCY: 2-3x/week   PT DURATION: 8 weeks   PLANNED INTERVENTIONS: Aquatic therapy, Therapeutic exercises, Therapeutic activity, Neuro Muscular re-education, Balance training, Gait training, Patient/Family education, Joint mobilization, Stair training, Visual/preceptual remediation/compensation, Wheelchair mobility training, Cryotherapy, Moist heat, and Manual therapy   PLAN FOR NEXT SESSION:  Core and balance strengthening.  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 04/19/2021   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 ([place]) 04/19/2021   Thrombosis    Sleep disturbance    Dysphagia, post-stroke     PAF (paroxysmal atrial fibrillation) ([place])    Acute pulmonary embolism without acute cor pulmonale (HCC)    Malnutrition of moderate degree 11/04/2020   Tracheostomy care ([place])    Pressure injury of skin 10/23/2020   Intracranial hemorrhage (HCC)    Atrial fibrillation (HCC)    Prediabetes    Leukocytosis    Acute blood loss anemia    Hypernatremia    ICH (intracerebral hemorrhage) ([place]) 09/25/2020   Unilateral primary osteoarthritis, left knee 09/26/2016   Atrial flutter ([place]) 09/25/2013   Near syncope 09/25/2013    Lanice Shirts, PT 05/31/2021, 2:32 PM  [facility] [address] [place], [place], [address] Phone: [phone]   Fax:  [facility] [address] [place], [place], [address] Phone: [phone]   Fax:  [phone]

## 2021-05-31 NOTE — Therapy (Signed)
[facility] [address] [place], [place], [address] Phone: [phone]   Fax:  [phone]  Occupational Therapy Treatment  Patient Details  Name: Walter [name] MRN: [id number] Date of Birth: March 07, 1956 Referring Provider (OT): Dr. Alysia Penna   Encounter Date: 05/31/2021   OT End of Session - 05/31/21 1110     Visit Number 29    Number of Visits 74    Date for OT Re-Evaluation 07/09/20    Authorization Type Medicare Part A & B **need to add KX**    Authorization - Visit Number 68    Authorization - Number of Visits 40    Progress Note Due on Visit 74    OT Start Time 1100    OT Stop Time 1140    OT Time Calculation (min) 40 min    Activity Tolerance Patient tolerated treatment well    Behavior During Therapy [facility] for tasks assessed/performed             Past Medical History:  Diagnosis Date   Atrial flutter ([place])    s/p ablation   DVT (deep venous thrombosis) ([place])    Near syncope 09/25/2013   Paroxysmal atrial fibrillation ([place])    Stroke ([place])    initially hemorragic (not on [place]) followed by subsequent embolic stroke    Past Surgical History:  Procedure Laterality Date   A-FLUTTER ABLATION N/A 03/09/2020   Procedure: A-FLUTTER ABLATION;  Surgeon: Thompson Grayer, MD;  Location: [facility] LAB;  Service: Cardiovascular;  Laterality: N/A;   CARDIOVERSION N/A 09/27/2013   Procedure: CARDIOVERSION;  Surgeon: Lelon Perla, MD;  Location: [facility] ENDOSCOPY;  Service: Cardiovascular;  Laterality: N/A;   IR ANGIO EXTERNAL CAROTID SEL EXT CAROTID UNI R MOD SED  09/26/2020   IR ANGIO INTRA EXTRACRAN SEL COM CAROTID INNOMINATE BILAT MOD SED  09/26/2020   IR ANGIO VERTEBRAL SEL VERTEBRAL UNI R MOD SED  09/26/2020   IR IVC FILTER PLMT / S&I /IMG GUID/MOD SED  10/08/2020   IR IVUS EACH ADDITIONAL NON CORONARY VESSEL  11/02/2020   IR PTA VENOUS EXCEPT DIALYSIS CIRCUIT  11/02/2020   IR RADIOLOGIST EVAL & MGMT   01/17/2021   IR THROMBECT VENO MECH MOD SED  10/27/2020   IR THROMBECT VENO MECH MOD SED  11/02/2020   IR US GUIDE VASC ACCESS LEFT  10/27/2020   IR US GUIDE VASC ACCESS RIGHT  09/26/2020   IR US GUIDE VASC ACCESS RIGHT  11/02/2020   IR VENO/EXT/UNI RIGHT  11/02/2020   IR VENOCAVAGRAM IVC  10/27/2020   RETINAL DETACHMENT SURGERY     right knee arthroscopy     TEE WITHOUT CARDIOVERSION N/A 09/27/2013   Procedure: TRANSESOPHAGEAL ECHOCARDIOGRAM (TEE);  Surgeon: Lelon Perla, MD;  Location: [facility] ENDOSCOPY;  Service: Cardiovascular;  Laterality: N/A;   WRIST SURGERY      Vitals:   05/31/21 1106  BP: 125/79  Pulse: 90  SpO2: 97%     Subjective Assessment - 05/31/21 1106     Subjective  Pt reports that he is getting a prism put in his glasses today    Pertinent History ICH of brainstem (complicated by hydrocephalus, DVTs, and tracheostomy).  PMH:  a-fib    Limitations fall risk, ataxia, impulsivity    Patient Stated Goals use L side of body better (decrease ataxia), stay active and heal brain    Currently in Pain? Yes    Pain Score 4     Pain Location  Shoulder    Pain Orientation Left    Pain Descriptors / Indicators Aching    Pain Type Acute pain    Pain Onset In the past 7 days    Pain Frequency Intermittent    Aggravating Factors  booster    Pain Relieving Factors rest                  treatment: Pt./ therapist discussed pt's visit with neuro optometry yesterday. Pt is getting fitted for a prism today. Then he plans to participate in vision therapy. Pt reports he is going to CIR in [place] in January and he requests d/c next week. Pt reports his endurance is not what it should be, he still fatigues very quickly. Standing to perform functional reaching with LUE, moderate ataxia present, min v.c facilitation. Pt required rest break and vitals were monitored.  Standing to reach with RUE to place graded clothespins on vertical antenna, with improved ease, minguard for balance,  and min v.c for foot positioning. Seated therapist checked LUE 9 hole peg test, pt with significant ataxia today and he did not meet goal. Therapsit later had pt practice picking up pegs by opening hand wide and watching hand to target hole, pt with improved performance Ambulating from table to mat table with min guard with walker, min v.c for safety and sit to stand.                  OT Short Term Goals - 05/31/21 1112       OT SHORT TERM GOAL #1   Title Pt/caregiver will be independent with initial HEP for LUE coordination and R hand strength.--    Time 4    Period Weeks    Status Achieved   needs continued reinforcement, pt is not using consistently   Target Date 03/02/21      OT SHORT TERM GOAL #2   Title Pt/caregiver will be independent with vision HEP and visual compensation strategies.    Time 4    Period Weeks    Status Achieved   Pt/ family verbalize understanding of vision HEP, needs continued reinforcement of compensatory strategeis- pt is not performing head turns     OT SHORT TERM GOAL #3   Title Pt will improve LUE coordination for ADLs as shown by improving score on box and blocks test by at least 6.    Baseline L-21 blocks    Time 4    Period Weeks    Status On-going   04/27/21-26 for LUE increased by 5 blocks, (RUE 32)     OT SHORT TERM GOAL #4   Title Pt will perform simple snack prep/home maintenance task from w/c level mod I.    Time 4    Period Weeks    Status Achieved   not consistent 04/27/21     OT SHORT TERM GOAL #5   Title Pt will verbalize understanding of compensation strategies for sensory deficts for incr safety.    Time 4    Period Weeks    Status Achieved   Pt verbalized that he has to be careful about temperature, needs additional reinforcement.  03/12/21:  met     OT SHORT TERM GOAL #6   Title Pt will improve R hand grip stength to at least 32lbs to assist with ADLs/opening containers.    Time 4    Period Weeks    Status  Achieved   04/27/21-64.4, L 81.8     OT SHORT  TERM GOAL #7   Title Pt will consistently navigate a busy environment at a walker level with no more than 2 v.c . for walker safety and avoiding items.    Time 4    Period Weeks    Status On-going      OT SHORT TERM GOAL #8   Title Pt will perform transitional movements modified independently  without LOB for increased safety with ADLS.    Time 4    Period Weeks    Status On-going               OT Long Term Goals - 05/31/21 1137       OT LONG TERM GOAL #1   Title Pt will be independent with updated HEP--    Time 10    Period Weeks    Status On-going      OT LONG TERM GOAL #2   Title Pt will improve L hand coordination for ADLs as shown by completing 9-hole peg test in less than 70sec.    Baseline 107sec    Time 10    Period Weeks    Status Not Met   1 min 53- 05/31/21     OT LONG TERM GOAL #3   Title Pt will be mod I with toileting.    Time 10    Period Weeks    Status Partially Met   met per pt report, however supervision recommended at htis time as pt balance appears worse since he took topamx, supervision recommended at this time10/31/22     OT LONG TERM GOAL #4   Title Pt will perform simple snack prep/home maintenance tasks in standing with supervision.    Time 10    Period Weeks    Status On-going   caregivers are performing     OT LONG TERM GOAL #5   Title Pt will demo good safety awareness/compensation for sensory deficits and attention to LUE during functional tasks and transfers without cueing.    Time 10    Period Weeks    Status On-going   04/17/21:  pt with difficulty/bumping into items with RW on L side at times, pt demo impulsivity at times with movement or with distractions with LUE movement (particularly with release of objects)     OT LONG TERM GOAL #6   Title Pt will perform simple environmental scanning/navigation with at least 90% accuracy for incr safety without v.c to avoid bumping items     Time 10    Period Weeks    Status Revised   03/12/21:  70% with CGA for balance/ambulation.  04/17/21:  100% with scanning for objects, but needed close supervision/min cueing for safety/bumping into items on L side     OT LONG TERM GOAL #7   Title Pt will perform all basic ADLS with no more than supervision.    Time 10    Period Weeks    Status On-going   03/12/21:  min A-supervision.  04/17/21:  min A for boot, min A for shower transfer, supervision for tolieting                  Plan - 05/31/21 1303     Clinical Impression Statement Pt reports seeing neuro optometrist yesterday. He is getting fitted for a new prism today. Pt is only scheduled through next week for OT and he is in agreement with plans for d/c. Pt is going to [place] for [place] in January.    OT  Occupational Profile and History Detailed Assessment- Review of Records and additional review of physical, cognitive, psychosocial history related to current functional performance    Occupational performance deficits (Please refer to evaluation for details): ADL's;IADL's;Work;Leisure;Social Participation    Body Structure / Function / Physical Skills ADL;Strength;Balance;Proprioception;UE functional use;IADL;Endurance;Vision;Mobility;Coordination;Decreased knowledge of precautions;FMC;GMC;Sensation    Cognitive Skills Attention;Safety Awareness;Memory;Perception;Problem Solve    Rehab Potential Good    Clinical Decision Making Several treatment options, min-mod task modification necessary    OT Frequency 2x / week    OT Duration --   10 weeks   OT Treatment/Interventions Self-care/ADL training;Moist Heat;Fluidtherapy;DME and/or AE instruction;Balance training;Therapeutic activities;Aquatic Therapy;Ultrasound;Therapeutic exercise;Cognitive remediation/compensation;Visual/perceptual remediation/compensation;Functional Mobility Training;Neuromuscular education;Cryotherapy;Energy conservation;Manual Therapy;Patient/family education     Plan check goals, anticipate d/c next week    Consulted and Agree with Plan of Care Patient             Patient will benefit from skilled therapeutic intervention in order to improve the following deficits and impairments:   Body Structure / Function / Physical Skills: ADL, Strength, Balance, Proprioception, UE functional use, IADL, Endurance, Vision, Mobility, Coordination, Decreased knowledge of precautions, FMC, GMC, Sensation Cognitive Skills: Attention, Safety Awareness, Memory, Perception, Problem Solve     Visit Diagnosis: Other abnormalities of gait and mobility  Unsteadiness on feet  Chronic pain of right knee  Difficulty in walking, not elsewhere classified  Other lack of coordination  Visuospatial deficit  Muscle weakness (generalized)  Ataxia    Problem List Patient Active Problem List   Diagnosis Date Noted   Residual cognitive deficit as late effect of stroke 04/19/2021   Hemiparesis affecting right side as late effect of stroke ([place]) 04/19/2021   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 04/19/2021   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 ([place]) 04/19/2021   Thrombosis    Sleep disturbance    Dysphagia, post-stroke    PAF (paroxysmal atrial fibrillation) ([facility])    Acute pulmonary embolism without acute cor pulmonale (H[facility])    Malnutrition of moderate degree 11/04/2020   Tracheostomy care ([place])    Pressure injury of skin 10/23/2020   Intracranial hemorrhage ([facility])    Atrial fibrillation ([facility])    Prediabetes    Leukocytosis    Acute blood loss anemia    Hypernatremia    ICH (intracerebral hemorrhage) ([place]) 09/25/2020   Unilateral primary osteoarthritis, left knee 09/26/2016   Atrial flutter ([place]) 09/25/2013   Near syncope 09/25/2013    Nashae Maudlin, OT/L 05/31/2021, 1:06 PM  [facility] [address] [place], [place], [address] Phone: [phone]   Fax:   [phone]  Name: Walter [name] MRN: [id number] Date of Birth: [birth year]

## 2021-05-31 NOTE — Therapy (Signed)
[facility] [address] [place], [place], [address] Phone: [phone]   Fax:  [phone]  Speech Language Pathology Treatment  Patient Details  Name: Walter [name] MRN: [id number] Date of Birth: 1956/03/03 Referring Provider (SLP): Charlett Blake, MD   Encounter Date: 05/31/2021   End of Session - 05/31/21 1018     Visit Number 26    Number of Visits 40    Date for SLP Re-Evaluation 06/15/21    Authorization Type Medicare    SLP Start Time 1018    SLP Stop Time  1100    SLP Time Calculation (min) 42 min    Activity Tolerance Patient tolerated treatment well             Past Medical History:  Diagnosis Date   Atrial flutter ([place])    s/p ablation   DVT (deep venous thrombosis) ([place])    Near syncope 09/25/2013   Paroxysmal atrial fibrillation ([place])    Stroke [facility])    initially hemorragic (not on [facility]) followed by subsequent embolic stroke    Past Surgical History:  Procedure Laterality Date   A-FLUTTER ABLATION N/A 03/09/2020   Procedure: A-FLUTTER ABLATION;  Surgeon: Thompson Grayer, MD;  Location: [facility] CV LAB;  Service: Cardiovascular;  Laterality: N/A;   CARDIOVERSION N/A 09/27/2013   Procedure: CARDIOVERSION;  Surgeon: Lelon Perla, MD;  Location: [facility] ENDOSCOPY;  Service: Cardiovascular;  Laterality: N/A;   IR ANGIO EXTERNAL CAROTID SEL EXT CAROTID UNI R MOD SED  09/26/2020   IR ANGIO INTRA EXTRACRAN SEL COM CAROTID INNOMINATE BILAT MOD SED  09/26/2020   IR ANGIO VERTEBRAL SEL VERTEBRAL UNI R MOD SED  09/26/2020   IR IVC FILTER PLMT / S&I /IMG GUID/MOD SED  10/08/2020   IR IVUS EACH ADDITIONAL NON CORONARY VESSEL  11/02/2020   IR PTA VENOUS EXCEPT DIALYSIS CIRCUIT  11/02/2020   IR RADIOLOGIST EVAL & MGMT  01/17/2021   IR THROMBECT VENO MECH MOD SED  10/27/2020   IR THROMBECT VENO MECH MOD SED  11/02/2020   IR US GUIDE VASC ACCESS LEFT  10/27/2020   IR US GUIDE VASC ACCESS RIGHT  09/26/2020   IR US  GUIDE VASC ACCESS RIGHT  11/02/2020   IR VENO/EXT/UNI RIGHT  11/02/2020   IR VENOCAVAGRAM IVC  10/27/2020   RETINAL DETACHMENT SURGERY     right knee arthroscopy     TEE WITHOUT CARDIOVERSION N/A 09/27/2013   Procedure: TRANSESOPHAGEAL ECHOCARDIOGRAM (TEE);  Surgeon: Lelon Perla, MD;  Location: [facility] ENDOSCOPY;  Service: Cardiovascular;  Laterality: N/A;   WRIST SURGERY      There were no vitals filed for this visit.   Subjective Assessment - 05/31/21 1018     Subjective "you need to fax the notes"    Currently in Pain? Yes    Pain Score 4     Pain Location Shoulder    Pain Orientation Left                   ADULT SLP TREATMENT - 05/31/21 1017       General Information   Behavior/Cognition Alert;Pleasant mood;Requires cueing;Cooperative      Treatment Provided   Treatment provided Cognitive-Linquistic      Cognitive-Linquistic Treatment   Treatment focused on Cognition;Patient/family/caregiver education    Skilled Treatment SLP engaged patient in functional recall related to personally relevant topics. Pt appeared to have good recall of financial deadlines, materials related to new house construction, and  recent holiday. Pt showed photos during discussion, in which SLP prompted patient to re-focus attention on next targeted task. Pt endorsed more difficulty focusing after completing phyiscally demanding tasks. SLP educated pt on some energy conservation strategies, including taking more breaks and focusing on cognitive tasks in morning and prior to physically demanding tasks. Pt verbalized understanding. SLP targeted attention to auditory instructions and problem solving, in which pt benefited from rare repetition while writing notes. Good problem solving and reasoning exhibited for task.      Assessment / Recommendations / Plan   Plan Continue with current plan of care     Progression Toward Goals   Progression toward goals Progressing toward goals               SLP Education - 05/31/21 1518     Education Details energy conservations strategies    Person(s) Educated Patient    Methods Explanation;Demonstration    Comprehension Verbalized understanding;Returned demonstration;Need further instruction              SLP Short Term Goals - 04/17/21 1709       SLP SHORT TERM GOAL #1   Title Pt will complete dysarthria HEP with occasional min A over 2 sessions    Status Not Met      SLP SHORT TERM GOAL #2   Title Pt will produce loud /a/ or "hey!" with at least high 80s dB average over 2 sessions    Status Not Met      SLP SHORT TERM GOAL #3   Title Pt will generate abdominal breathing in 16/20 answers to SLP questions over 2 sessions    Baseline 01-18-21    Status Partially Met      SLP SHORT TERM GOAL #4   Title Pt will demonstrate dysarthria compensations in 10 minute simple to mod complex conversation to be 75% intelligibile with occasional min A over 2 sessions    Status Not Met      SLP SHORT TERM GOAL #5   Title Pt will complete formal cognitive linguistic assessment in first 1-2 ST sessions    Status Achieved      SLP SHORT TERM GOAL #6   Title Pt will use memory and attention compensations for appointments, medicine management, and other daily activities with occasional min A over 2 sessions    Status Not Met              SLP Long Term Goals - 05/31/21 1018       SLP LONG TERM GOAL #1   Title Pt will complete dysarthria HEP with rare min A over 2 sessions    Baseline 03-20-21, 03-22-21    Status Achieved      SLP LONG TERM GOAL #2   Title Pt will demonstrate dysarthria compensations in 10 minute simple conversation to be 90% intelligibile with rare min A over 3 sessions    Baseline 03-22-21; 03-29-21, 04-03-21    Status Achieved      SLP LONG TERM GOAL #3   Title Pt will demo improved sustained and selective attention during therapeutic tasks with less than 3 cues for active listening during 2 sessions    Baseline  04-05-21    Status Achieved      SLP LONG TERM GOAL #4   Title Pt will demonstrate anticipatory awareness of impulsivity with min cues to modify behaviors during 2 sessions    Baseline 04-11-21, 05-17-21    Status Achieved  SLP LONG TERM GOAL #5   Title Pt will report reduced frustration and improved communication effectiveness via PROM by 2 points at last ST sessions    Baseline CES: 15.5 & V-RQOL:35    Time 4    Period Weeks    Status On-going    Target Date 06/15/21      SLP LONG TERM GOAL #6   Title Pt will selectively attend to cognitive linguistic task for 5 minutes with 3 or less cues for redirection over 3 sessions    Baseline 04-03-21, 04-11-21    Status Achieved      SLP LONG TERM GOAL #7   Title Pt will identify any errors and generate cognitive strategy to aid performance with rare min A over 3 sessions    Baseline 05-31-21    Time 4    Period Weeks    Status On-going    Target Date 06/15/21              Plan - 05/31/21 1018     Clinical Impression Statement "Walter [name]" presents for OPST intervention secondary to stroke in March 2022.  Pt agrees to cont cognition as pt endorses he has not yet returned to baseline. SLP targeted executive functioning, including attention to detail and problem solving for mod complex cognitive tasks. Pt aware of need to "develop a thought process" to aid success and performance. Walter [name] would cont to benefit from skilled ST to address cognition to maximize communication effectiveness and optimize functional independence and safety at home.    Speech Therapy Frequency 1x /week    Duration 4 weeks   20 weeks   Treatment/Interventions Cueing hierarchy;Functional tasks;Patient/family education;Cognitive reorganization;Multimodal communcation approach;Language facilitation;Compensatory techniques;Internal/external aids;SLP instruction and feedback    Potential to Achieve Goals Good    Potential Considerations Severity of impairments     Consulted and Agree with Plan of Care Patient             Patient will benefit from skilled therapeutic intervention in order to improve the following deficits and impairments:   Cognitive communication deficit    Problem List Patient Active Problem List   Diagnosis Date Noted   Residual cognitive deficit as late effect of stroke 04/19/2021   Hemiparesis affecting right side as late effect of stroke ([place]) 04/19/2021   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 04/19/2021   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 ([place]) 04/19/2021   Thrombosis    Sleep disturbance    Dysphagia, post-stroke    PAF (paroxysmal atrial fibrillation) ([place])    Acute pulmonary embolism without acute cor pulmonale (HCC)    Malnutrition of moderate degree 11/04/2020   Tracheostomy care ([place])    Pressure injury of skin 10/23/2020   Intracranial hemorrhage ([facility])    Atrial fibrillation ([place])    Prediabetes    Leukocytosis    Acute blood loss anemia    Hypernatremia    ICH (intracerebral hemorrhage) ([place]) 09/25/2020   Unilateral primary osteoarthritis, left knee 09/26/2016   Atrial flutter ([place]) 09/25/2013   Near syncope 09/25/2013    Alinda Deem, CCC-SLP 05/31/2021, 3:21 PM  [facility] [address] [place], [place], [address] Phone: [phone]   Fax:  [phone]   Name: Walter [name] MRN: [id number] Date of Birth: September 09, 1955

## 2021-05-31 NOTE — Therapy (Signed)
[facility] [address] [place], [place], [address] Phone: [phone]   Fax:  [phone]  Physical Therapy Treatment  Patient Details  Name: Walter [name] MRN: [id number] Date of Birth: 05/24/1956 No data recorded  Encounter Date: 05/31/2021   PT End of Session - 05/31/21 1153     Visit Number 45    Number of Visits 44    Date for PT Re-Evaluation 07/02/21    Authorization Type MCR    Authorization Time Period 04/28/21-07/03/21    Progress Note Due on Visit 40    PT Start Time 1145    PT Stop Time 1230    PT Time Calculation (min) 45 min    Equipment Utilized During Treatment Gait belt    Activity Tolerance Patient tolerated treatment well    Behavior During Therapy [facility] for tasks assessed/performed             Past Medical History:  Diagnosis Date   Atrial flutter (HCC)    s/p ablation   DVT (deep venous thrombosis) (HCC)    Near syncope 09/25/2013   Paroxysmal atrial fibrillation (HCC)    Stroke (HCC)    initially hemorragic (not on OAC) followed by subsequent embolic stroke    Past Surgical History:  Procedure Laterality Date   A-FLUTTER ABLATION N/A 03/09/2020   Procedure: A-FLUTTER ABLATION;  Surgeon: Hillis Range, MD;  [facility]: [facility] INVASIVE CV LAB;  Service: Cardiovascular;  Laterality: N/A;   CARDIOVERSION N/A 09/27/2013   Procedure: CARDIOVERSION;  Surgeon: Lewayne Bunting, MD;  Location: [facility] ENDOSCOPY;  Service: Cardiovascular;  Laterality: N/A;   IR ANGIO EXTERNAL CAROTID SEL EXT CAROTID UNI R MOD SED  09/26/2020   IR ANGIO INTRA EXTRACRAN SEL COM CAROTID INNOMINATE BILAT MOD SED  09/26/2020   IR ANGIO VERTEBRAL SEL VERTEBRAL UNI R MOD SED  09/26/2020   IR IVC FILTER PLMT / S&I /IMG GUID/MOD SED  10/08/2020   IR IVUS EACH ADDITIONAL NON CORONARY VESSEL  11/02/2020   IR PTA VENOUS EXCEPT DIALYSIS CIRCUIT  11/02/2020   IR RADIOLOGIST EVAL & MGMT  01/17/2021   IR THROMBECT VENO MECH MOD SED  10/27/2020    IR THROMBECT VENO MECH MOD SED  11/02/2020   IR US GUIDE VASC ACCESS LEFT  10/27/2020   IR US GUIDE VASC ACCESS RIGHT  09/26/2020   IR US GUIDE VASC ACCESS RIGHT  11/02/2020   IR VENO/EXT/UNI RIGHT  11/02/2020   IR VENOCAVAGRAM IVC  10/27/2020   RETINAL DETACHMENT SURGERY     right knee arthroscopy     TEE WITHOUT CARDIOVERSION N/A 09/27/2013   Procedure: TRANSESOPHAGEAL ECHOCARDIOGRAM (TEE);  Surgeon: Lewayne Bunting, MD;  Location: [facility] ENDOSCOPY;  Service: Cardiovascular;  Laterality: N/A;   WRIST SURGERY      There were no vitals filed for this visit.                                            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 04/19/2021   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 ([name]) 04/19/2021   Thrombosis    Sleep disturbance    Dysphagia, post-stroke    PAF (paroxysmal atrial fibrillation) (HCC)    Acute pulmonary embolism without acute cor pulmonale (HCC)    Malnutrition of moderate degree 11/04/2020   Tracheostomy care ([place])    Pressure injury of skin 10/23/2020   Intracranial hemorrhage (HCC)    Atrial fibrillation (HCC)    Prediabetes    Leukocytosis    Acute blood loss anemia    Hypernatremia    ICH (intracerebral hemorrhage) ([name]) 09/25/2020   Unilateral primary osteoarthritis, left knee 09/26/2016   Atrial flutter ([place]) 09/25/2013   Near syncope 09/25/2013    Lanice Shirts, PT 05/31/2021, 12:07 PM  [facility] [address] [place], [place], [address] Phone: [phone]   Fax:  [phone]  Name: Walter [name] MRN: [id number] Date of Birth: December 31, 1955

## 2021-06-01 ENCOUNTER — Encounter (HOSPITAL_BASED_OUTPATIENT_CLINIC_OR_DEPARTMENT_OTHER): Payer: Self-pay | Admitting: Physical Therapy

## 2021-06-01 ENCOUNTER — Ambulatory Visit (HOSPITAL_BASED_OUTPATIENT_CLINIC_OR_DEPARTMENT_OTHER): Payer: Medicare Other | Attending: Physical Medicine & Rehabilitation | Admitting: Physical Therapy

## 2021-06-01 DIAGNOSIS — R270 Ataxia, unspecified: Secondary | ICD-10-CM | POA: Diagnosis present

## 2021-06-01 DIAGNOSIS — R2689 Other abnormalities of gait and mobility: Secondary | ICD-10-CM | POA: Diagnosis present

## 2021-06-01 DIAGNOSIS — R262 Difficulty in walking, not elsewhere classified: Secondary | ICD-10-CM | POA: Insufficient documentation

## 2021-06-01 DIAGNOSIS — R2681 Unsteadiness on feet: Secondary | ICD-10-CM | POA: Insufficient documentation

## 2021-06-01 NOTE — Therapy (Signed)
OUTPATIENT PHYSICAL THERAPY TREATMENT NOTE Patient Name: Walter [name] MRN: [id number] DOB:05/20/1956, 65 y.o., male Today's Date: 06/01/2021  PCP: Alroy Dust, L.Marlou Sa, MD REFERRING PROVIDER: Alroy Dust, Carlean Jews.Marlou Sa, MD   See Note below for Objective Data and Assessment of Progress/Goals.      PT End of Session - 06/01/21 0946     Visit Number 46    Number of Visits 50    Date for PT Re-Evaluation 07/02/21    Authorization Type MCR    Authorization Time Period 04/28/21-07/03/21    Progress Note Due on Visit 22    PT Start Time 0858    PT Stop Time 0936    PT Time Calculation (min) 38 min    Equipment Utilized During Treatment Gait belt    Activity Tolerance Patient tolerated treatment well    Behavior During Therapy WFL for tasks assessed/performed                       Past Medical History:  Diagnosis Date   Atrial flutter ([place])    s/p ablation   DVT (deep venous thrombosis) (HCC)    Near syncope 09/25/2013   Paroxysmal atrial fibrillation (HCC)    Stroke ([place])    initially hemorragic (not on [place]) followed by subsequent embolic stroke   Past Surgical History:  Procedure Laterality Date   A-FLUTTER ABLATION N/A 03/09/2020   Procedure: A-FLUTTER ABLATION;  Surgeon: Thompson Grayer, MD;  Location: [place] CV LAB;  Service: Cardiovascular;  Laterality: N/A;   CARDIOVERSION N/A 09/27/2013   Procedure: CARDIOVERSION;  Surgeon: Lelon Perla, MD;  Location: [facility] ENDOSCOPY;  Service: Cardiovascular;  Laterality: N/A;   IR ANGIO EXTERNAL CAROTID SEL EXT CAROTID UNI R MOD SED  09/26/2020   IR ANGIO INTRA EXTRACRAN SEL COM CAROTID INNOMINATE BILAT MOD SED  09/26/2020   IR ANGIO VERTEBRAL SEL VERTEBRAL UNI R MOD SED  09/26/2020   IR IVC FILTER PLMT / S&I /IMG GUID/MOD SED  10/08/2020   IR IVUS EACH ADDITIONAL NON CORONARY VESSEL  11/02/2020   IR PTA VENOUS EXCEPT DIALYSIS CIRCUIT  11/02/2020   IR RADIOLOGIST EVAL & MGMT  01/17/2021   IR THROMBECT VENO MECH MOD SED  10/27/2020    IR THROMBECT VENO MECH MOD SED  11/02/2020   IR US GUIDE VASC ACCESS LEFT  10/27/2020   IR US GUIDE VASC ACCESS RIGHT  09/26/2020   IR US GUIDE VASC ACCESS RIGHT  11/02/2020   IR VENO/EXT/UNI RIGHT  11/02/2020   IR VENOCAVAGRAM IVC  10/27/2020   RETINAL DETACHMENT SURGERY     right knee arthroscopy     TEE WITHOUT CARDIOVERSION N/A 09/27/2013   Procedure: TRANSESOPHAGEAL ECHOCARDIOGRAM (TEE);  Surgeon: Lelon Perla, MD;  Location: [facility] ENDOSCOPY;  Service: Cardiovascular;  Laterality: N/A;   WRIST SURGERY     Patient Active Problem List   Diagnosis Date Noted   Residual cognitive deficit as late effect of stroke 04/19/2021   Hemiparesis affecting right side as late effect of stroke ([place]) 04/19/2021   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 04/19/2021   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 ([place]) 04/19/2021   Thrombosis    Sleep disturbance    Dysphagia, post-stroke    PAF (paroxysmal atrial fibrillation) ([place])    Acute pulmonary embolism without acute cor pulmonale (HCC)    Malnutrition of moderate degree 11/04/2020   Tracheostomy care ([place])    Pressure injury of skin 10/23/2020   Intracranial hemorrhage ([place])  Atrial fibrillation (HCC)    Prediabetes    Leukocytosis    Acute blood loss anemia    Hypernatremia    ICH (intracerebral hemorrhage) ([place]) 09/25/2020   Unilateral primary osteoarthritis, left knee 09/26/2016   Atrial flutter ([place]) 09/25/2013   Near syncope 09/25/2013  See Note below for Objective Data and Assessment of Progress/Goals.  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  Loves aquatic therapy  TODAY'S TREATMENT:   06/01/21 Pt seen for aquatic therapy today.   Treatment took place in water 3.25-4.8 ft in depth at the [facility] pool. Temp of water was 92.Pt entered/exited the pool via stairs step to pattern independently with bilat rail.  Satnding -step ups 3 ft x15 R/L. Increased difficulty due to less submersion -side stepping onto water step R/L x 15. Cues weight shift and maintaining balance/control. Slow pace.  Supine suspension with hand buoys -knees to chest 2 x 10 -forward to back pendulum Initiated side to side pendulum.   05/31/21  Ambulation of 1107f with cane 3#, and 6# LLE, 1 trial due to marked balance issues Core tasks of 4KG ball transfers from floor to lap to [facility] toss[facility], shoulder tosses, all with 4 KG ball, 15x each STS with 4KG ball 15x   05/22/21   Scifit L4 seat 20, arms 7, L4  Ambulation of 1375fwith cane 3#, and 6# LLE, 2 trials Core tasks of 4KG ball transfers from floor to lap to [facility] toss[facility], shoulder tosses, all with 4 KG ball, 15x each STS with 4KG ball 15x    05/17/21 Ambulation trials of 11583f3 with 6# weight on L ankle, once with rollator, once with RW and once with weighted cane(3#).  Some difficulty controlling rollator per patient admission.  Negotiation of 16 steps with B rails ascending with step through and descending step to sideways.  Fatigued after stairs.  Remaining time spent performing core tasks of 4KG ball transfers from floor to lap to [facility] toss[facility], shoulder tosses and Vs all with 4 KG ball   PATIENT EDUCATION: Education details: Scheduling more PT visits for October Person educated: Patient and caregiver Education method: ExpConsulting civil engineeremMedia plannernd Verbal cues Education comprehension: verbalized understanding     HOME EXERCISE PROGRAM: House ambulation with Cgs in RW/Consider membership to a pool.  ASSESSMENT:   CLINICAL IMPRESSION:  Continues with increased unsteadiness in pool today.  Cues to slow pace improves balance but pt has difficulty with maintaining.   Focus on core strength in suspended prone and supine.   REHAB POTENTIAL: Good   CLINICAL DECISION MAKING: Stable/uncomplicated  GOALS: Goals reviewed with patient? Yes   SHORT TERM GOALS:   STG Name Target Date Goal status  1 Patient will be able to perform chair to chair transfer with CGA/SBA with use of RW for safety to improve independence. Baseline: min to mod A (12/26/20); CGA/minA (03/09/21) 01/23/2021 03/27/21 Able to transfer STS and stand pivot with SBA, goal met  2 Pt will be able to ambulate 115' with RW with CGA to min A with RW for safety to improve independence Baseline: min to mod A with RW 145'; 230 feet with RW and CGA to MIN A 01/23/2021 03/09/21 Goal met  3 Pt will be able to maintain standing  balance for 30 sec to improve static balance Baseline: 10 sec before needing help due to LOB (12/26/20) 01/23/2021 02/05/21 able to stand with S for 30s, goal met  4. Pt will be able to perform sit to stand transfers with SBA to improve independence 04/06/21 Goal met 03/27/21  5. Pt will be able to perform 230 feet of walking with RW with SBA/CGA to improve household ambulation  04/06/21 05/11/21 540f with RW and CGA, GOAL MET    LONG TERM GOALS:   LTG Name Target Date Goal status  1 Pt will demo >5 points improvement on Berg Balance Scale to improve static balance Baseline: 11/56 01/03/21 05/28/2021 05/11/21 BERG score 41/56  2 Pt will be able to ambulate >250 feet with cane and CGA to improve walking endurance and safety. Baseline: 145' with RW min to mod A; 230 feet with CGA to Min A(revised 05/11/21)  05/28/21 05/11/21 5043fwith RW and CGA  3 Pt  will be able to perform gait speed improvement by 0.15108mto improve community ambulation. Baseline:0.41m50m9/9/22) 05/28/21 Progressing continue 04/03/21 UTA due to achilles partial tear  4 Pt will demo 5 sec improvement in 5x sit to stand (with or without UE support) to improve functional strength Baseline: 5x sit to stand with bil UE 17  sec;  05/28/21 05/11/21 5x STS 13.6s    PLAN: PT FREQUENCY: 2-3x/week   PT DURATION: 8 weeks   PLANNED INTERVENTIONS: Aquatic therapy, Therapeutic exercises, Therapeutic activity, Neuro Muscular re-education, Balance training, Gait training, Patient/Family education, Joint mobilization, Stair training, Visual/preceptual remediation/compensation, Wheelchair mobility training, Cryotherapy, Moist heat, and Manual therapy   PLAN FOR NEXT SESSION:  Core and balance strengthening.  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 04/19/2021   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 (HCC)[place]/20/2022   Thrombosis    Sleep disturbance    Dysphagia, post-stroke    PAF (paroxysmal atrial fibrillation) (HCC)[place] Acute pulmonary embolism without acute cor pulmonale (HCC)    Malnutrition of moderate degree 11/04/2020   Tracheostomy care (HCC)[place] Pressure injury of skin 10/23/2020   Intracranial hemorrhage (HCC)    Atrial fibrillation (HCC)    Prediabetes    Leukocytosis    Acute blood loss anemia    Hypernatremia    ICH (intracerebral hemorrhage) (HCC)[place]/28/2022   Unilateral primary osteoarthritis, left knee 09/26/2016   Atrial flutter (HCC)[place]/28/2015   Near syncope 09/25/2013    MaryVedia PereyraT 06/01/2021, 2:04 PM  [facility] [address], [id number]: [phone]:  [address], [id number]: [phone]:  [phone]

## 2021-06-03 ENCOUNTER — Other Ambulatory Visit: Payer: Self-pay | Admitting: Neurology

## 2021-06-05 ENCOUNTER — Ambulatory Visit: Payer: Medicare Other

## 2021-06-05 ENCOUNTER — Encounter: Payer: Self-pay | Admitting: Occupational Therapy

## 2021-06-05 ENCOUNTER — Ambulatory Visit: Payer: Medicare Other | Admitting: Occupational Therapy

## 2021-06-05 ENCOUNTER — Other Ambulatory Visit: Payer: Self-pay

## 2021-06-05 DIAGNOSIS — R2681 Unsteadiness on feet: Secondary | ICD-10-CM

## 2021-06-05 DIAGNOSIS — M6281 Muscle weakness (generalized): Secondary | ICD-10-CM

## 2021-06-05 DIAGNOSIS — R41841 Cognitive communication deficit: Secondary | ICD-10-CM

## 2021-06-05 DIAGNOSIS — R270 Ataxia, unspecified: Secondary | ICD-10-CM

## 2021-06-05 DIAGNOSIS — R471 Dysarthria and anarthria: Secondary | ICD-10-CM

## 2021-06-05 DIAGNOSIS — R278 Other lack of coordination: Secondary | ICD-10-CM

## 2021-06-05 DIAGNOSIS — R262 Difficulty in walking, not elsewhere classified: Secondary | ICD-10-CM

## 2021-06-05 DIAGNOSIS — R41844 Frontal lobe and executive function deficit: Secondary | ICD-10-CM

## 2021-06-05 DIAGNOSIS — R41842 Visuospatial deficit: Secondary | ICD-10-CM

## 2021-06-05 DIAGNOSIS — G8929 Other chronic pain: Secondary | ICD-10-CM

## 2021-06-05 DIAGNOSIS — R293 Abnormal posture: Secondary | ICD-10-CM | POA: Diagnosis not present

## 2021-06-05 DIAGNOSIS — R2689 Other abnormalities of gait and mobility: Secondary | ICD-10-CM

## 2021-06-05 NOTE — Therapy (Addendum)
[facility] [address] [place], [place], [address] Phone: [phone]   Fax:  [phone]  Speech Language Pathology Treatment/Discharge  Patient Details  Name: Walter [name] MRN: [id number] Date of Birth: 26-Jan-1956 Referring Provider (SLP): Charlett Blake, MD   Encounter Date: 06/05/2021   End of Session - 06/05/21 1200     Visit Number 27    Number of Visits 40    Date for SLP Re-Evaluation 06/15/21    Authorization Type Medicare    SLP Start Time 1146    SLP Stop Time  1216    SLP Time Calculation (min) 30 min    Activity Tolerance Patient tolerated treatment well             Past Medical History:  Diagnosis Date   Atrial flutter ([place])    s/p ablation   DVT (deep venous thrombosis) ([place])    Near syncope 09/25/2013   Paroxysmal atrial fibrillation ([place])    Stroke [facility])    initially hemorragic (not on [facility]) followed by subsequent embolic stroke    Past Surgical History:  Procedure Laterality Date   A-FLUTTER ABLATION N/A 03/09/2020   Procedure: A-FLUTTER ABLATION;  Surgeon: Thompson Grayer, MD;  Location: [place] CV LAB;  Service: Cardiovascular;  Laterality: N/A;   CARDIOVERSION N/A 09/27/2013   Procedure: CARDIOVERSION;  Surgeon: Lelon Perla, MD;  Location: [facility] ENDOSCOPY;  Service: Cardiovascular;  Laterality: N/A;   IR ANGIO EXTERNAL CAROTID SEL EXT CAROTID UNI R MOD SED  09/26/2020   IR ANGIO INTRA EXTRACRAN SEL COM CAROTID INNOMINATE BILAT MOD SED  09/26/2020   IR ANGIO VERTEBRAL SEL VERTEBRAL UNI R MOD SED  09/26/2020   IR IVC FILTER PLMT / S&I /IMG GUID/MOD SED  10/08/2020   IR IVUS EACH ADDITIONAL NON CORONARY VESSEL  11/02/2020   IR PTA VENOUS EXCEPT DIALYSIS CIRCUIT  11/02/2020   IR RADIOLOGIST EVAL & MGMT  01/17/2021   IR THROMBECT VENO MECH MOD SED  10/27/2020   IR THROMBECT VENO MECH MOD SED  11/02/2020   IR US GUIDE VASC ACCESS LEFT  10/27/2020   IR US GUIDE VASC ACCESS RIGHT   09/26/2020   IR US GUIDE VASC ACCESS RIGHT  11/02/2020   IR VENO/EXT/UNI RIGHT  11/02/2020   IR VENOCAVAGRAM IVC  10/27/2020   RETINAL DETACHMENT SURGERY     right knee arthroscopy     TEE WITHOUT CARDIOVERSION N/A 09/27/2013   Procedure: TRANSESOPHAGEAL ECHOCARDIOGRAM (TEE);  Surgeon: Lelon Perla, MD;  Location: [facility] ENDOSCOPY;  Service: Cardiovascular;  Laterality: N/A;   WRIST SURGERY      There were no vitals filed for this visit.   Subjective Assessment - 06/05/21 1152     Subjective "today is my last day. don't cry"    Patient is accompained by: Family member   caregiver   Currently in Pain? No/denies            SPEECH THERAPY DISCHARGE SUMMARY  Visits from Start of Care: 27  Current functional level related to goals / functional outcomes: Walter [name] requested ST discharge this date as pt is pleased with current progress. Pt has made significant improvements in dysarthria, with pt able to maintain increased volume and clarity throughout extended conversation. More recent ST sessions have focused on attention and executive functioning with occasional fading to rare min A required to complete cognitive tasks. Pt may benefit from further cognitive and communication intervention, but pt requested break at this time.  Remaining deficits: Mild cognitive deficits, mild dysarthria   Education / Equipment: Dysarthria compensations and HEP, cognitive strategies and rehab, caregiver education    Patient agrees to discharge. Patient goals were partially met. Patient is being discharged due to being pleased with the current functional level..          ADULT SLP TREATMENT - 06/05/21 1150       General Information   Behavior/Cognition Alert;Pleasant mood;Requires cueing;Cooperative      Treatment Provided   Treatment provided Cognitive-Linquistic      Cognitive-Linquistic Treatment   Treatment focused on Cognition;Patient/family/caregiver education    Skilled Treatment Pt  stated "today is our last day." Pt requested ST discharge this day. SLP repeated communication PROMS, with 10-15 point improvements exhibited (see LTG). Pt is expecting to begin with new rehab program in [place] in January, in which pt would like to continue to address communication as pt noted differences in his enunication and intonation for work related tasks. Pt would probably also benefit from further cognitive linguistic training to address high level attention and executive functioning skills. Pt aware of current barriers of vision and fatigue associated with cognitive and physical tasks.      Assessment / Recommendations / Plan   Plan Discharge SLP treatment due to (comment)   pt request     Progression Toward Goals   Progression toward goals Goals met, education completed, patient discharged from [place] Education - 06/05/21 1219     Education Details discharge summary    Person(s) Educated Patient;Caregiver(s)    Methods Explanation;Demonstration    Comprehension Verbalized understanding;Returned demonstration              SLP Short Term Goals - 04/17/21 1709       SLP SHORT TERM GOAL #1   Title Pt will complete dysarthria HEP with occasional min A over 2 sessions    Status Not Met      SLP SHORT TERM GOAL #2   Title Pt will produce loud /a/ or "hey!" with at least high 80s dB average over 2 sessions    Status Not Met      SLP SHORT TERM GOAL #3   Title Pt will generate abdominal breathing in 16/20 answers to SLP questions over 2 sessions    Baseline 01-18-21    Status Partially Met      SLP SHORT TERM GOAL #4   Title Pt will demonstrate dysarthria compensations in 10 minute simple to mod complex conversation to be 75% intelligibile with occasional min A over 2 sessions    Status Not Met      SLP SHORT TERM GOAL #5   Title Pt will complete formal cognitive linguistic assessment in first 1-2 ST sessions    Status Achieved      SLP SHORT TERM  GOAL #6   Title Pt will use memory and attention compensations for appointments, medicine management, and other daily activities with occasional min A over 2 sessions    Status Not Met              SLP Long Term Goals - 06/05/21 1201       SLP LONG TERM GOAL #1   Title Pt will complete dysarthria HEP with rare min A over 2 sessions    Baseline 03-20-21, 03-22-21    Status Achieved      SLP LONG TERM GOAL #2   Title  Pt will demonstrate dysarthria compensations in 10 minute simple conversation to be 90% intelligibile with rare min A over 3 sessions    Baseline 03-22-21; 03-29-21, 04-03-21    Status Achieved      SLP LONG TERM GOAL #3   Title Pt will demo improved sustained and selective attention during therapeutic tasks with less than 3 cues for active listening during 2 sessions    Baseline 04-05-21    Status Achieved      SLP LONG TERM GOAL #4   Title Pt will demonstrate anticipatory awareness of impulsivity with min cues to modify behaviors during 2 sessions    Baseline 04-11-21, 05-17-21    Status Achieved      SLP LONG TERM GOAL #5   Title Pt will report reduced frustration and improved communication effectiveness via PROM by 2 points at last ST sessions    Baseline CES: 15.5 & V-RQOL:35; CES: 24 & V-RQOL: 15    Status Achieved      SLP LONG TERM GOAL #6   Title Pt will selectively attend to cognitive linguistic task for 5 minutes with 3 or less cues for redirection over 3 sessions    Baseline 04-03-21, 04-11-21    Status Achieved      SLP LONG TERM GOAL #7   Title Pt will identify any errors and generate cognitive strategy to aid performance with rare min A over 3 sessions    Baseline 05-31-21, 06-05-21    Status Partially Met              Plan - 06/05/21 1219     Clinical Impression Statement "Walter [name]" presents for OPST intervention secondary to stroke in March 2022.  Pt is pleased with current progress and requested break from therapy services at this time. Pt is  planning on attending alternative rehab in [facility] and would like to further address cognition and communication following needed break from therapy services. SLP reviewed recommendations and provided education re: progress thus far.             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 04/19/2021   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 ([place]) 04/19/2021   Thrombosis    Sleep disturbance    Dysphagia, post-stroke    PAF (paroxysmal atrial fibrillation) ([place])    Acute pulmonary embolism without acute cor pulmonale (HCC)    Malnutrition of moderate degree 11/04/2020   Tracheostomy care ([place])    Pressure injury of skin 10/23/2020   Intracranial hemorrhage (HCC)    Atrial fibrillation ([place])    Prediabetes    Leukocytosis    Acute blood loss anemia    Hypernatremia    ICH (intracerebral hemorrhage) ([place]) 09/25/2020   Unilateral primary osteoarthritis, left knee 09/26/2016   Atrial flutter ([place]) 09/25/2013   Near syncope 09/25/2013    Alinda Deem, CCC-SLP 06/05/2021, 12:22 PM  [facility] [address] [place], [place], [address] Phone: [phone]   Fax:  [phone]   Name: Walter [name] MRN: [id number] Date of Birth: May 02, 1956

## 2021-06-05 NOTE — Therapy (Signed)
OUTPATIENT PHYSICAL THERAPY TREATMENT NOTE Patient Name: Walter [name] MRN: [id number] DOB:[birth year], 65 y.o., male Today's Date: 06/05/2021  PCP: Alroy Dust, L.Marlou Sa, MD REFERRING PROVIDER: Alroy Dust, Carlean Jews.Marlou Sa, MD      PT End of Session - 06/05/21 1326     Visit Number 97    Number of Visits 82    Date for PT Re-Evaluation 07/02/21    Authorization Type MCR    Authorization Time Period 04/28/21-07/03/21    Progress Note Due on Visit 51    PT Start Time 1230    PT Stop Time 1311    PT Time Calculation (min) 41 min    Equipment Utilized During Treatment Gait belt    Activity Tolerance Patient tolerated treatment well    Behavior During Therapy WFL for tasks assessed/performed              Past Medical History:  Diagnosis Date   Atrial flutter ([place])    s/p ablation   DVT (deep venous thrombosis) (HCC)    Near syncope 09/25/2013   Paroxysmal atrial fibrillation ([place])    Stroke ([place])    initially hemorragic (not on [place]) followed by subsequent embolic stroke   Past Surgical History:  Procedure Laterality Date   A-FLUTTER ABLATION N/A 03/09/2020   Procedure: A-FLUTTER ABLATION;  Surgeon: Thompson Grayer, MD;  Location: [facility];  Service: Cardiovascular;  Laterality: N/A;   CARDIOVERSION N/A 09/27/2013   Procedure: CARDIOVERSION;  Surgeon: Lelon Perla, MD;  Location: [facility] ENDOSCOPY;  Service: Cardiovascular;  Laterality: N/A;   IR ANGIO EXTERNAL CAROTID SEL EXT CAROTID UNI R MOD SED  09/26/2020   IR ANGIO INTRA EXTRACRAN SEL COM CAROTID INNOMINATE BILAT MOD SED  09/26/2020   IR ANGIO VERTEBRAL SEL VERTEBRAL UNI R MOD SED  09/26/2020   IR IVC FILTER PLMT / S&I /IMG GUID/MOD SED  10/08/2020   IR IVUS EACH ADDITIONAL NON CORONARY VESSEL  11/02/2020   IR PTA VENOUS EXCEPT DIALYSIS CIRCUIT  11/02/2020   IR RADIOLOGIST EVAL & MGMT  01/17/2021   IR THROMBECT VENO MECH MOD SED  10/27/2020   IR THROMBECT VENO MECH MOD SED  11/02/2020   IR US GUIDE VASC ACCESS LEFT  10/27/2020   IR  US GUIDE VASC ACCESS RIGHT  09/26/2020   IR US GUIDE VASC ACCESS RIGHT  11/02/2020   IR VENO/EXT/UNI RIGHT  11/02/2020   IR VENOCAVAGRAM IVC  10/27/2020   RETINAL DETACHMENT SURGERY     right knee arthroscopy     TEE WITHOUT CARDIOVERSION N/A 09/27/2013   Procedure: TRANSESOPHAGEAL ECHOCARDIOGRAM (TEE);  Surgeon: Lelon Perla, MD;  Location: [facility] ENDOSCOPY;  Service: Cardiovascular;  Laterality: N/A;   WRIST SURGERY     Patient Active Problem List   Diagnosis Date Noted   Residual cognitive deficit as late effect of stroke 04/19/2021   Hemiparesis affecting right side as late effect of stroke ([place]) 04/19/2021   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 04/19/2021   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 ([place]) 04/19/2021   Thrombosis    Sleep disturbance    Dysphagia, post-stroke    PAF (paroxysmal atrial fibrillation) ([place])    Acute pulmonary embolism without acute cor pulmonale (HCC)    Malnutrition of moderate degree 11/04/2020   Tracheostomy care ([place])    Pressure injury of skin 10/23/2020   Intracranial hemorrhage (HCC)    Atrial fibrillation (HCC)    Prediabetes    Leukocytosis    Acute blood loss anemia  Hypernatremia    ICH (intracerebral hemorrhage) ([place]) 09/25/2020   Unilateral primary osteoarthritis, left knee 09/26/2016   Atrial flutter ([place]) 09/25/2013   Near syncope 09/25/2013   See Note below for Objective Data and Assessment of Progress/Goals.  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 No falls to report.   TODAY'S TREATMENT:   [facility] Adult PT Treatment/Exercise - 06/05/21 0001       Transfers   Transfers  Sit to Stand;Stand to Sit    Sit to Stand 4: Min guard    Five time sit to stand comments  13.8 seconds with BUE support on second trial. 1st trial demo decreased control and ataxia noted with frequent CGA required.    Stand to Sit 4: Min guard      Ambulation/Gait   Ambulation/Gait Yes    Ambulation/Gait Assistance 4: Min guard    Ambulation Distance (Feet) 300 Feet    Assistive device Rolling walker    Gait Pattern Ataxic    Ambulation Surface Level;Indoor    Gait velocity 18.52 secs = 0.54 m/s      Standardized Balance Assessment   Standardized Balance Assessment Berg Balance Test      Berg Balance Test   Sit to Stand Able to stand  independently using hands    Standing Unsupported Able to stand 2 minutes with supervision    Sitting with Back Unsupported but Feet Supported on Floor or Stool Able to sit safely and securely 2 minutes    Stand to Sit Uses backs of legs against chair to control descent    Transfers Able to transfer with verbal cueing and /or supervision    Standing Unsupported with Eyes Closed Able to stand 10 seconds with supervision    Standing Ubsupported with Feet Together Able to place feet together independently but unable to hold for 30 seconds    From Standing, Reach Forward with Outstretched Arm Can reach forward >12 cm safely (5")   7"   From Standing Position, Pick up Object from Floor Able to pick up shoe, needs supervision    From Standing Position, Turn to Look Behind Over each Shoulder Needs supervision when turning    Turn 360 Degrees Needs close supervision or verbal cueing   with use of RW   Standing Unsupported, Alternately Place Feet on Step/Stool Able to complete >2 steps/needs minimal assist    Standing Unsupported, One Foot in Front Needs help to step but can hold 15 seconds    Standing on One Leg Tries to lift leg/unable to hold 3 seconds but remains standing independently    Total Score 30               PATIENT EDUCATION: Education  details: Progress toward LTG; Plan to finish up with Aquatic PT and D/C.  Person educated: Patient and caregiver Education method: Explanation, Demonstration, and Verbal cues Education comprehension: verbalized understanding     HOME EXERCISE PROGRAM: Continue house ambulation w/ caregiver using RW. Consider membership to a pool.  ASSESSMENT:   CLINICAL IMPRESSION:  Today's skilled PT session focused on assessment of patient's progress toward all LTG. Patient demo ability to meet LTG #3, partailly meet LTG #2 and #4 today. Improved  gait speed to 0.54 m/s with RW and 5x sit <> stand to 13.8 seconds. Patient still continue to demo varying ataxia and increased cues required for slowed pace to promote control. Patient has made progress with PT services. Will plan to d/c from PT services after last aquatic therapy session.     REHAB POTENTIAL: Good   CLINICAL DECISION MAKING: Stable/uncomplicated  GOALS: Goals reviewed with patient? Yes   SHORT TERM GOALS:   STG Name Target Date Goal status  1 Patient will be able to perform chair to chair transfer with CGA/SBA with use of RW for safety to improve independence. Baseline: min to mod A (12/26/20); CGA/minA (03/09/21) 01/23/2021 03/27/21 Able to transfer STS and stand pivot with SBA, goal met  2 Pt will be able to ambulate 115' with RW with CGA to min A with RW for safety to improve independence Baseline: min to mod A with RW 145'; 230 feet with RW and CGA to MIN A 01/23/2021 03/09/21 Goal met  3 Pt will be able to maintain standing balance for 30 sec to improve static balance Baseline: 10 sec before needing help due to LOB (12/26/20) 01/23/2021 02/05/21 able to stand with S for 30s, goal met  4. Pt will be able to perform sit to stand transfers with SBA to improve independence 04/06/21 Goal met 03/27/21  5. Pt will be able to perform 230 feet of walking with RW with SBA/CGA to improve household ambulation  04/06/21 05/11/21 578f with RW and CGA, GOAL MET     LONG TERM GOALS:   LTG Name Target Date Goal status  1 Pt will demo >5 points improvement on Berg Balance Scale to improve static balance Baseline: 11/56 01/03/21 05/28/2021 05/11/21 BERG score 41/56; 06/05/21: 30/56 NOT MET  2 Pt will be able to ambulate >250 feet with cane and CGA to improve walking endurance and safety. Baseline: 145' with RW min to mod A; 230 feet with CGA to Min A(revised 05/11/21)  05/28/21 05/11/21 5088fwith RW and CGA; 06/05/21: 300 ft with RW and CGA  PARTIALLY MET  3 Pt  will be able to perform gait speed improvement by 0.1596mto improve community ambulation. Baseline:0.38m60m9/9/22) 0.54 m/s  05/28/21 Progressing continue 04/03/21 UTA due to achilles partial tear; 06/05/21: 0.54 m/s MET  4 Pt will demo 5 sec improvement in 5x sit to stand (with or without UE support) to improve functional strength Baseline: 5x sit to stand with bil UE 17 sec; 13.8 seconds with BUE support  05/28/21 05/11/21 5x STS 13.6s 06/05/21: 13.8 secs PARTIALLY MET    PLAN: PT FREQUENCY: 2-3x/week   PT DURATION: 8 weeks   PLANNED INTERVENTIONS: Aquatic therapy, Therapeutic exercises, Therapeutic activity, Neuro Muscular re-education, Balance training, Gait training, Patient/Family education, Joint mobilization, Stair training, Visual/preceptual remediation/compensation, Wheelchair mobility training, Cryotherapy, Moist heat, and Manual therapy   PLAN FOR NEXT SESSION:  Have checked all goals for land based therapy. Finish aquatic PT + D/C from PT services.  Visit Diagnosis: Ataxia  Unsteadiness on feet  Muscle weakness (generalized)  Other abnormalities of gait and mobility  Difficulty in walking, not elsewhere classified     Problem List Patient Active Problem List   Diagnosis Date Noted   Residual cognitive deficit as late effect of stroke 04/19/2021   Hemiparesis affecting right side as late effect of stroke (HCC)[place]/20/2022   Ataxia due to old intracerebral  hemorrhage 04/19/2021   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 (HCC)[place]/20/2022  Thrombosis    Sleep disturbance    Dysphagia, post-stroke    PAF (paroxysmal atrial fibrillation) ([place])    Acute pulmonary embolism without acute cor pulmonale (HCC)    Malnutrition of moderate degree 11/04/2020   Tracheostomy care ([place])    Pressure injury of skin 10/23/2020   Intracranial hemorrhage (HCC)    Atrial fibrillation ([place])    Prediabetes    Leukocytosis    Acute blood loss anemia    Hypernatremia    ICH (intracerebral hemorrhage) ([place]) 09/25/2020   Unilateral primary osteoarthritis, left knee 09/26/2016   Atrial flutter ([place]) 09/25/2013   Near syncope 09/25/2013    Jones Bales, PT, DPT 06/05/2021, 1:27 PM  [facility] [address] [place], [place], [address] Phone: [phone]   Fax:  [phone]

## 2021-06-06 NOTE — Therapy (Addendum)
[facility] [address] [place], [place], [address] Phone: [phone]   Fax:  [phone]  Occupational Therapy Treatment  Patient Details  Name: Walter [name] MRN: [id number] Date of Birth: July 17, 1955 Referring Provider (OT): Dr. Alysia Penna   Encounter Date: 06/05/2021   OT End of Session - 06/05/21 1108     Visit Number 71    Number of Visits 41    Date for OT Re-Evaluation 07/09/20    Authorization Type Medicare Part A & B **need to add KX**    Authorization - Visit Number 12    Authorization - Number of Visits 40    Progress Note Due on Visit 13    OT Start Time 1104    OT Stop Time 1145    OT Time Calculation (min) 41 min    Activity Tolerance Patient tolerated treatment well    Behavior During Therapy [facility] for tasks assessed/performed             Past Medical History:  Diagnosis Date   Atrial flutter ([place])    s/p ablation   DVT (deep venous thrombosis) ([place])    Near syncope 09/25/2013   Paroxysmal atrial fibrillation ([place])    Stroke ([place])    initially hemorragic (not on [place]) followed by subsequent embolic stroke    Past Surgical History:  Procedure Laterality Date   A-FLUTTER ABLATION N/A 03/09/2020   Procedure: A-FLUTTER ABLATION;  Surgeon: Thompson Grayer, MD;  Location: [facility];  Service: Cardiovascular;  Laterality: N/A;   CARDIOVERSION N/A 09/27/2013   Procedure: CARDIOVERSION;  Surgeon: Lelon Perla, MD;  Location: [facility] ENDOSCOPY;  Service: Cardiovascular;  Laterality: N/A;   IR ANGIO EXTERNAL CAROTID SEL EXT CAROTID UNI R MOD SED  09/26/2020   IR ANGIO INTRA EXTRACRAN SEL COM CAROTID INNOMINATE BILAT MOD SED  09/26/2020   IR ANGIO VERTEBRAL SEL VERTEBRAL UNI R MOD SED  09/26/2020   IR IVC FILTER PLMT / S&I /IMG GUID/MOD SED  10/08/2020   IR IVUS EACH ADDITIONAL NON CORONARY VESSEL  11/02/2020   IR PTA VENOUS EXCEPT DIALYSIS CIRCUIT  11/02/2020   IR RADIOLOGIST EVAL & MGMT   01/17/2021   IR THROMBECT VENO MECH MOD SED  10/27/2020   IR THROMBECT VENO MECH MOD SED  11/02/2020   IR US GUIDE VASC ACCESS LEFT  10/27/2020   IR US GUIDE VASC ACCESS RIGHT  09/26/2020   IR US GUIDE VASC ACCESS RIGHT  11/02/2020   IR VENO/EXT/UNI RIGHT  11/02/2020   IR VENOCAVAGRAM IVC  10/27/2020   RETINAL DETACHMENT SURGERY     right knee arthroscopy     TEE WITHOUT CARDIOVERSION N/A 09/27/2013   Procedure: TRANSESOPHAGEAL ECHOCARDIOGRAM (TEE);  Surgeon: Lelon Perla, MD;  Location: [facility] ENDOSCOPY;  Service: Cardiovascular;  Laterality: N/A;   WRIST SURGERY      There were no vitals filed for this visit.   Subjective Assessment - 06/05/21 1107     Subjective  Pt reports that it will take 2 weeks to get his prisms    Pertinent History ICH of brainstem (complicated by hydrocephalus, DVTs, and tracheostomy).  PMH:  a-fib    Limitations fall risk, ataxia, impulsivity    Patient Stated Goals use L side of body better (decrease ataxia), stay active and heal brain    Currently in Pain? No/denies                    Treatment: Discussed  overall progress and checked progress towards goals. Pt agrees with plans for d/c. Functional reaching with left and right UE's to place large to small pegs into semi-circle pegboard, min difficulty. Environmental scanning 14/16 located first pass Discussed safety with sit to stand and practice as pt has has inconsistent performance and he moves too quickly at times.              OT Education - 06/05/21 1305     Education Details information regarding walker tray and cup holders, reveiwed progress towards goals with patient    Person(s) Educated Patient;Caregiver(s)   Claiborne Billings   Methods Explanation;Demonstration;Verbal cues    Comprehension Verbalized understanding;Returned demonstration;Verbal cues required              OT Short Term Goals - 06/05/21 1134       OT SHORT TERM GOAL #1   Title Pt/caregiver will be independent with  initial HEP for LUE coordination and R hand strength.--    Time 4    Period Weeks    Status Achieved   needs continued reinforcement, pt is not using consistently   Target Date 03/02/21      OT SHORT TERM GOAL #2   Title Pt/caregiver will be independent with vision HEP and visual compensation strategies.    Time 4    Period Weeks    Status Achieved   Pt/ family verbalize understanding of vision HEP, needs continued reinforcement of compensatory strategeis- pt is not performing head turns     OT SHORT TERM GOAL #3   Title Pt will improve LUE coordination for ADLs as shown by improving score on box and blocks test by at least 6.    Baseline L-21 blocks    Time 4    Period Weeks    Status Not Met   26- 06/05/21, miproved but not fully achieved     OT SHORT TERM GOAL #4   Title Pt will perform simple snack prep/home maintenance task from w/c level mod I.    Time 4    Period Weeks    Status Achieved      OT SHORT TERM GOAL #5   Title Pt will verbalize understanding of compensation strategies for sensory deficts for incr safety.    Time 4    Period Weeks    Status Achieved   Pt verbalized that he has to be careful about temperature, needs additional reinforcement.  03/12/21:  met     OT SHORT TERM GOAL #6   Title Pt will improve R hand grip stength to at least 32lbs to assist with ADLs/opening containers.    Time 4    Period Weeks    Status Achieved   04/27/21-64.4, L 81.8     OT SHORT TERM GOAL #7   Title Pt will consistently navigate a busy environment at a walker level with no more than 2 v.c . for walker safety and avoiding items.    Time 4    Period Weeks    Status Achieved      OT SHORT TERM GOAL #8   Title Pt will perform transitional movements modified independently  without LOB for increased safety with ADLS.    Time 4    Period Weeks    Status Not Met   improved but not consistent              OT Long Term Goals - 06/05/21 1310       OT LONG TERM GOAL #  1    Title Pt will be independent with updated HEP--    Time 10    Period Weeks    Status Deferred   Pt has inital coordination activities that are still appropriate and closed chain activities for shoulder flexion     OT LONG TERM GOAL #2   Title Pt will improve L hand coordination for ADLs as shown by completing 9-hole peg test in less than 70sec.    Baseline 107sec    Time 10    Period Weeks    Status Not Met   1 min 53- 05/31/21     OT LONG TERM GOAL #3   Title Pt will be mod I with toileting.    Time 10    Period Weeks    Status Not Met   supervision recommended due to safety, pt perfroms mod I at times     OT LONG TERM GOAL #4   Title Pt will perform simple snack prep/home maintenance tasks in standing with supervision.    Time 10    Period Weeks    Status Not Met   requires at least min A     OT LONG TERM GOAL #5   Title Pt will demo good safety awareness/compensation for sensory deficits and attention to LUE during functional tasks and transfers without cueing.    Time 10    Period Weeks    Status Partially Met   improved but not fully met, pt verbalizes need to attend to LUE, however he still demonstrates continued difficulties     OT LONG TERM GOAL #6   Title Pt will perform simple environmental scanning/navigation with at least 90% accuracy for incr safety without v.c to avoid bumping items    Time 10    Period Weeks    Status Not Met   88%     OT LONG TERM GOAL #7   Title Pt will perform all basic ADLS with no more than supervision.    Time 10    Period Weeks    Status Not Met   min  A                  Plan - 06/05/21 1306     Clinical Impression Statement Pt made progress towards goals, however he did not fully achieve goals due to severity of deficits. He agrees with plans for d/c, pt will be transitioning to therapy in [place] in January.    OT Occupational Profile and History Detailed Assessment- Review of Records and additional review of physical,  cognitive, psychosocial history related to current functional performance    Occupational performance deficits (Please refer to evaluation for details): ADL's;IADL's;Work;Leisure;Social Participation    Body Structure / Function / Physical Skills ADL;Strength;Balance;Proprioception;UE functional use;IADL;Endurance;Vision;Mobility;Coordination;Decreased knowledge of precautions;FMC;GMC;Sensation    Cognitive Skills Attention;Safety Awareness;Memory;Perception;Problem Solve    Rehab Potential Good    Clinical Decision Making Several treatment options, min-mod task modification necessary    OT Frequency 2x / week    OT Duration --   10 weeks   OT Treatment/Interventions Self-care/ADL training;Moist Heat;Fluidtherapy;DME and/or AE instruction;Balance training;Therapeutic activities;Aquatic Therapy;Ultrasound;Therapeutic exercise;Cognitive remediation/compensation;Visual/perceptual remediation/compensation;Functional Mobility Training;Neuromuscular education;Cryotherapy;Energy conservation;Manual Therapy;Patient/family education    Plan d/c OT    Consulted and Agree with Plan of Care Patient             Patient will benefit from skilled therapeutic intervention in order to improve the following deficits and impairments:   Body Structure / Function / Physical Skills:  ADL, Strength, Balance, Proprioception, UE functional use, IADL, Endurance, Vision, Mobility, Coordination, Decreased knowledge of precautions, FMC, GMC, Sensation Cognitive Skills: Attention, Safety Awareness, Memory, Perception, Problem Solve   OCCUPATIONAL THERAPY DISCHARGE SUMMARY   Current functional level related to goals / functional outcomes: Pt made progress yet he did not fully achieve all goals due to the severity of deficits.   Remaining deficits: Ataxia, sensory deficits, decreased balance, cognitive deficits, visual deficits   Education / Equipment: Pt was educated in HEP, safety for ADLs, vision compensation  strategies, sensory precautions. Pt verbalizes understanding of all education.  Patient agrees to discharge. Patient goals were partially met. Patient is being discharged due to the patient's request. Pt is transitioning to CIR in [place] in January.      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 04/19/2021   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 ([place]) 04/19/2021   Thrombosis    Sleep disturbance    Dysphagia, post-stroke    PAF (paroxysmal atrial fibrillation) ([place])    Acute pulmonary embolism without acute cor pulmonale (HCC)    Malnutrition of moderate degree 11/04/2020   Tracheostomy care ([place])    Pressure injury of skin 10/23/2020   Intracranial hemorrhage (HCC)    Atrial fibrillation (HCC)    Prediabetes    Leukocytosis    Acute blood loss anemia    Hypernatremia    ICH (intracerebral hemorrhage) ([place]) 09/25/2020   Unilateral primary osteoarthritis, left knee 09/26/2016   Atrial flutter ([place]) 09/25/2013   Near syncope 09/25/2013    Kwali Wrinkle, OT 06/06/2021, 8:08 AM Theone Murdoch, OTR/L [phone] Phone: [phone] 8:08 AM 06/06/21  [facility] [address] [place], [place], [address] Phone: [phone]   Fax:  [phone]  Name: TRAPPER MEECH MRN: [id number] Date of Birth: September 23, 1955

## 2021-06-07 ENCOUNTER — Other Ambulatory Visit: Payer: Self-pay | Admitting: Internal Medicine

## 2021-06-07 ENCOUNTER — Ambulatory Visit: Payer: Medicare Other | Admitting: Occupational Therapy

## 2021-06-07 ENCOUNTER — Telehealth: Payer: Self-pay | Admitting: Cardiology

## 2021-06-07 ENCOUNTER — Ambulatory Visit: Payer: Medicare Other

## 2021-06-07 NOTE — Telephone Encounter (Signed)
Eliquis 5mg  refill request received. Patient is 65 years old, weight-90.7kg, Crea-0.70 on 01/15/2021, Diagnosis-Afib, CVA, DVT, and last seen by Dr. 01/17/2021 on 04/02/2021. Dose is appropriate based on dosing criteria. Will send in refill to requested pharmacy.

## 2021-06-07 NOTE — Telephone Encounter (Signed)
FYI  Called pt to schedule sleep compliance appt and was informed he has turned his machine in.

## 2021-06-08 ENCOUNTER — Ambulatory Visit: Payer: Medicare Other | Admitting: Internal Medicine

## 2021-06-08 ENCOUNTER — Ambulatory Visit (HOSPITAL_BASED_OUTPATIENT_CLINIC_OR_DEPARTMENT_OTHER): Payer: Medicare Other | Admitting: Physical Therapy

## 2021-06-08 ENCOUNTER — Encounter (HOSPITAL_BASED_OUTPATIENT_CLINIC_OR_DEPARTMENT_OTHER): Payer: Self-pay | Admitting: Physical Therapy

## 2021-06-08 ENCOUNTER — Other Ambulatory Visit: Payer: Self-pay

## 2021-06-08 DIAGNOSIS — R2681 Unsteadiness on feet: Secondary | ICD-10-CM

## 2021-06-08 DIAGNOSIS — R2689 Other abnormalities of gait and mobility: Secondary | ICD-10-CM

## 2021-06-08 DIAGNOSIS — I480 Paroxysmal atrial fibrillation: Secondary | ICD-10-CM

## 2021-06-08 DIAGNOSIS — R262 Difficulty in walking, not elsewhere classified: Secondary | ICD-10-CM | POA: Diagnosis not present

## 2021-06-08 DIAGNOSIS — I483 Typical atrial flutter: Secondary | ICD-10-CM

## 2021-06-08 DIAGNOSIS — R270 Ataxia, unspecified: Secondary | ICD-10-CM

## 2021-06-10 NOTE — Therapy (Signed)
OUTPATIENT PHYSICAL THERAPY TREATMENT NOTE Patient Name: Walter [name] MRN: [id number] DOB:12/03/1955, 65 y.o., male Today's Date: 06/10/2021  PCP: Alroy Dust, L.Marlou Sa, MD REFERRING PROVIDER: Alroy Dust, Carlean Jews.Marlou Sa, MD   Visit number:48/50 Start time:901 Stop time: 993 Pt tolerated treatment well WFL for tasks performed      Past Medical History:  Diagnosis Date   Atrial flutter [facility])    s/p ablation   DVT (deep venous thrombosis) ([place])    Near syncope 09/25/2013   Paroxysmal atrial fibrillation (HCC)    Stroke [facility])    initially hemorragic (not on [place]) followed by subsequent embolic stroke   Past Surgical History:  Procedure Laterality Date   A-FLUTTER ABLATION N/A 03/09/2020   Procedure: A-FLUTTER ABLATION;  Surgeon: Thompson Grayer, MD;  Location: [place] CV LAB;  Service: Cardiovascular;  Laterality: N/A;   CARDIOVERSION N/A 09/27/2013   Procedure: CARDIOVERSION;  Surgeon: Lelon Perla, MD;  Location: [facility] ENDOSCOPY;  Service: Cardiovascular;  Laterality: N/A;   IR ANGIO EXTERNAL CAROTID SEL EXT CAROTID UNI R MOD SED  09/26/2020   IR ANGIO INTRA EXTRACRAN SEL COM CAROTID INNOMINATE BILAT MOD SED  09/26/2020   IR ANGIO VERTEBRAL SEL VERTEBRAL UNI R MOD SED  09/26/2020   IR IVC FILTER PLMT / S&I /IMG GUID/MOD SED  10/08/2020   IR IVUS EACH ADDITIONAL NON CORONARY VESSEL  11/02/2020   IR PTA VENOUS EXCEPT DIALYSIS CIRCUIT  11/02/2020   IR RADIOLOGIST EVAL & MGMT  01/17/2021   IR THROMBECT VENO MECH MOD SED  10/27/2020   IR THROMBECT VENO MECH MOD SED  11/02/2020   IR US GUIDE VASC ACCESS LEFT  10/27/2020   IR US GUIDE VASC ACCESS RIGHT  09/26/2020   IR US GUIDE VASC ACCESS RIGHT  11/02/2020   IR VENO/EXT/UNI RIGHT  11/02/2020   IR VENOCAVAGRAM IVC  10/27/2020   RETINAL DETACHMENT SURGERY     right knee arthroscopy     TEE WITHOUT CARDIOVERSION N/A 09/27/2013   Procedure: TRANSESOPHAGEAL ECHOCARDIOGRAM (TEE);  Surgeon: Lelon Perla, MD;  Location: [facility] ENDOSCOPY;  Service:  Cardiovascular;  Laterality: N/A;   WRIST SURGERY     Patient Active Problem List   Diagnosis Date Noted   Residual cognitive deficit as late effect of stroke 04/19/2021   Hemiparesis affecting right side as late effect of stroke ([place]) 04/19/2021   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 04/19/2021   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 ([place]) 04/19/2021   Thrombosis    Sleep disturbance    Dysphagia, post-stroke    PAF (paroxysmal atrial fibrillation) ([place])    Acute pulmonary embolism without acute cor pulmonale (HCC)    Malnutrition of moderate degree 11/04/2020   Tracheostomy care ([place])    Pressure injury of skin 10/23/2020   Intracranial hemorrhage (HCC)    Atrial fibrillation (HCC)    Prediabetes    Leukocytosis    Acute blood loss anemia    Hypernatremia    ICH (intracerebral hemorrhage) ([place]) 09/25/2020   Unilateral primary osteoarthritis, left knee 09/26/2016   Atrial flutter ([place]) 09/25/2013   Near syncope 09/25/2013   See Note below for Objective Data and Assessment of Progress/Goals.  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   TODAY'S TREATMENT:   06/08/21 Pt  seen for aquatic therapy today.  Treatment took place in water 3.25-4.8 ft in depth at the [facility] pool. Temp of water was 92.Pt entered/exited the pool via stairs step to pattern independently with bilat rail.   Standing Holding to wall Hip flex; extension; adduction/abd 2 x 15 reps. Cues for tightened core, slowed pace and control of movement   Supine suspension with hand buoys for core strength -knees to chest 2 x 12 -forward to back pendulum mod assist 2x10 -Side to side  pendulum with mod assist 2 x10  Gait training 3 ft to 4.8 ft: Cues for direct path, slowing pace improved control of movement Fwd, retro and side stepping 1 length each.      PATIENT EDUCATION: Education details: Progress toward LTG; Plan to finish up with Aquatic PT and D/C.  Person educated: Patient and caregiver Education method: Explanation, Demonstration, and Verbal cues Education comprehension: verbalized understanding     HOME EXERCISE PROGRAM: Continue house ambulation w/ caregiver using RW. Consider membership to a pool.  ASSESSMENT:   CLINICAL IMPRESSION:  Fall at home last weekend.  Other than slight bruising no other injury.  Focused on strengthening of core today. Pt requiring substantial assist with supine suspended Ex due to rushing to complete/impulsivity.  His endurance is improving needing few rest periods. Ataxia with limited improvement.  He will be Dc'ed next visit reaching max potential. Pt continues to plan on spending 1 month at rehab in [place] through January.   REHAB POTENTIAL: Good   CLINICAL DECISION MAKING: Stable/uncomplicated  GOALS: Goals reviewed with patient? Yes   SHORT TERM GOALS:   STG Name Target Date Goal status  1 Patient will be able to perform chair to chair transfer with CGA/SBA with use of RW for safety to improve independence. Baseline: min to mod A (12/26/20); CGA/minA (03/09/21) 01/23/2021 03/27/21 Able to transfer STS and stand pivot with SBA, goal met  2 Pt will be able to ambulate 115' with RW with CGA to min A with RW for safety to improve independence Baseline: min to mod A with RW 145'; 230 feet with RW and CGA to MIN A 01/23/2021 03/09/21 Goal met  3 Pt will be able to maintain standing balance for 30 sec to improve static balance Baseline: 10 sec before needing help due to LOB (12/26/20) 01/23/2021 02/05/21 able to stand with S for 30s, goal met  4. Pt will be able to perform sit to stand transfers with SBA to improve independence  04/06/21 Goal met 03/27/21  5. Pt will be able to perform 230 feet of walking with RW with SBA/CGA to improve household ambulation  04/06/21 05/11/21 580f with RW and CGA, GOAL MET    LONG TERM GOALS:   LTG Name Target Date Goal status  1 Pt will demo >5 points improvement on Berg Balance Scale to improve static balance Baseline: 11/56 01/03/21 05/28/2021 05/11/21 BERG score 41/56; 06/05/21: 30/56 NOT MET  2 Pt will be able to ambulate >250 feet with cane and CGA to improve walking endurance and safety. Baseline: 145' with RW min to mod A; 230 feet with CGA to Min A(revised 05/11/21)  05/28/21 05/11/21 5032fwith RW and CGA; 06/05/21: 300 ft with RW and CGA  PARTIALLY MET  3 Pt  will be able to perform gait speed improvement by 0.1538mto improve community ambulation. Baseline:0.69m60m9/9/22) 0.54 m/s  05/28/21 Progressing continue 04/03/21 UTA due to achilles partial tear; 06/05/21: 0.54 m/s MET  4 Pt will  demo 5 sec improvement in 5x sit to stand (with or without UE support) to improve functional strength Baseline: 5x sit to stand with bil UE 17 sec; 13.8 seconds with BUE support  05/28/21 05/11/21 5x STS 13.6s 06/05/21: 13.8 secs PARTIALLY MET    PLAN: PT FREQUENCY: 2-3x/week   PT DURATION: 8 weeks   PLANNED INTERVENTIONS: Aquatic therapy, Therapeutic exercises, Therapeutic activity, Neuro Muscular re-education, Balance training, Gait training, Patient/Family education, Joint mobilization, Stair training, Visual/preceptual remediation/compensation, Wheelchair mobility training, Cryotherapy, Moist heat, and Manual therapy   PLAN FOR NEXT SESSION:   Finish aquatic PT + D/C from PT services.  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 04/19/2021   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 ([place]) 04/19/2021   Thrombosis    Sleep disturbance    Dysphagia, post-stroke    PAF (paroxysmal atrial fibrillation) ([place])    Acute pulmonary embolism without acute cor pulmonale (HCC)    Malnutrition of moderate degree 11/04/2020   Tracheostomy care ([place])    Pressure injury of skin 10/23/2020   Intracranial hemorrhage (HCC)    Atrial fibrillation ([place])    Prediabetes    Leukocytosis    Acute blood loss anemia    Hypernatremia    ICH (intracerebral hemorrhage) ([place]) 09/25/2020   Unilateral primary osteoarthritis, left knee 09/26/2016   Atrial flutter ([place]) 09/25/2013   Near syncope 09/25/2013    Vedia Pereyra, MPT 06/10/2021, 9:49 PM  [facility] [address] [place], [place], [address] Phone: [phone]   Fax:  [phone]

## 2021-06-12 ENCOUNTER — Ambulatory Visit: Payer: Medicare Other

## 2021-06-12 ENCOUNTER — Telehealth: Payer: Self-pay

## 2021-06-12 NOTE — Telephone Encounter (Signed)
Patient's wife called stating the handicap sticker for them has expired. She wants to know if they need to do anything to get this extended. Please advise

## 2021-06-13 NOTE — Telephone Encounter (Signed)
Walter [name] states the patient said the cpap made him uncomfortable and he did not like the machine and now he has a oral appliance.

## 2021-06-15 ENCOUNTER — Other Ambulatory Visit: Payer: Self-pay

## 2021-06-15 ENCOUNTER — Ambulatory Visit (HOSPITAL_BASED_OUTPATIENT_CLINIC_OR_DEPARTMENT_OTHER): Payer: Medicare Other | Admitting: Physical Therapy

## 2021-06-15 ENCOUNTER — Encounter (HOSPITAL_BASED_OUTPATIENT_CLINIC_OR_DEPARTMENT_OTHER): Payer: Self-pay | Admitting: Physical Therapy

## 2021-06-15 DIAGNOSIS — R262 Difficulty in walking, not elsewhere classified: Secondary | ICD-10-CM | POA: Diagnosis not present

## 2021-06-15 DIAGNOSIS — R270 Ataxia, unspecified: Secondary | ICD-10-CM

## 2021-06-15 DIAGNOSIS — R2681 Unsteadiness on feet: Secondary | ICD-10-CM

## 2021-06-15 DIAGNOSIS — R2689 Other abnormalities of gait and mobility: Secondary | ICD-10-CM

## 2021-06-15 NOTE — Therapy (Signed)
OUTPATIENT PHYSICAL THERAPY TREATMENT NOTE Patient Name: Walter [name] MRN: [id number] DOB:1956-02-18, 65 y.o., male Today's Date: 06/15/2021  PCP: Alroy Dust, L.Marlou Sa, MD REFERRING PROVIDER: Alroy Dust, Carlean Jews.Marlou Sa, MD    PT End of Session - 06/15/21 0944     Visit Number 44    Number of Visits 85    Date for PT Re-Evaluation 07/02/21    Authorization Type MCR    Authorization Time Period 04/28/21-07/03/21    Progress Note Due on Visit 68    PT Start Time 0901    PT Stop Time 0944    PT Time Calculation (min) 43 min    Equipment Utilized During Treatment Gait belt    Activity Tolerance Patient tolerated treatment well    Behavior During Therapy WFL for tasks assessed/performed               Past Medical History:  Diagnosis Date   Atrial flutter ([place])    s/p ablation   DVT (deep venous thrombosis) (HCC)    Near syncope 09/25/2013   Paroxysmal atrial fibrillation ([place])    Stroke [facility])    initially hemorragic (not on [place]) followed by subsequent embolic stroke   Past Surgical History:  Procedure Laterality Date   A-FLUTTER ABLATION N/A 03/09/2020   Procedure: A-FLUTTER ABLATION;  Surgeon: Thompson Grayer, MD;  Location: [place] CV LAB;  Service: Cardiovascular;  Laterality: N/A;   CARDIOVERSION N/A 09/27/2013   Procedure: CARDIOVERSION;  Surgeon: Lelon Perla, MD;  Location: [facility] ENDOSCOPY;  Service: Cardiovascular;  Laterality: N/A;   IR ANGIO EXTERNAL CAROTID SEL EXT CAROTID UNI R MOD SED  09/26/2020   IR ANGIO INTRA EXTRACRAN SEL COM CAROTID INNOMINATE BILAT MOD SED  09/26/2020   IR ANGIO VERTEBRAL SEL VERTEBRAL UNI R MOD SED  09/26/2020   IR IVC FILTER PLMT / S&I /IMG GUID/MOD SED  10/08/2020   IR IVUS EACH ADDITIONAL NON CORONARY VESSEL  11/02/2020   IR PTA VENOUS EXCEPT DIALYSIS CIRCUIT  11/02/2020   IR RADIOLOGIST EVAL & MGMT  01/17/2021   IR THROMBECT VENO MECH MOD SED  10/27/2020   IR THROMBECT VENO MECH MOD SED  11/02/2020   IR US GUIDE VASC ACCESS LEFT  10/27/2020   IR  US GUIDE VASC ACCESS RIGHT  09/26/2020   IR US GUIDE VASC ACCESS RIGHT  11/02/2020   IR VENO/EXT/UNI RIGHT  11/02/2020   IR VENOCAVAGRAM IVC  10/27/2020   RETINAL DETACHMENT SURGERY     right knee arthroscopy     TEE WITHOUT CARDIOVERSION N/A 09/27/2013   Procedure: TRANSESOPHAGEAL ECHOCARDIOGRAM (TEE);  Surgeon: Lelon Perla, MD;  Location: [facility] ENDOSCOPY;  Service: Cardiovascular;  Laterality: N/A;   WRIST SURGERY     Patient Active Problem List   Diagnosis Date Noted   Residual cognitive deficit as late effect of stroke 04/19/2021   Hemiparesis affecting right side as late effect of stroke ([place]) 04/19/2021   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 04/19/2021   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 ([place]) 04/19/2021   Thrombosis    Sleep disturbance    Dysphagia, post-stroke    PAF (paroxysmal atrial fibrillation) ([place])    Acute pulmonary embolism without acute cor pulmonale (HCC)    Malnutrition of moderate degree 11/04/2020   Tracheostomy care ([place])    Pressure injury of skin 10/23/2020   Intracranial hemorrhage (HCC)    Atrial fibrillation (HCC)    Prediabetes    Leukocytosis    Acute blood loss anemia  Hypernatremia    ICH (intracerebral hemorrhage) ([place]) 09/25/2020   Unilateral primary osteoarthritis, left knee 09/26/2016   Atrial flutter ([place]) 09/25/2013   Near syncope 09/25/2013   See Note below for Objective Data and Assessment of Progress/Goals.  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  Treatment took place in water 3.25-4.8 ft in depth at the [facility] pool. Temp of water was 92.Pt  entered/exited the pool via stairs step to pattern independently with bilat rail.    Gait training 3 ft to 4.8 ft: Cues for direct path, slowing pace improved control of movement Fwd, retro and side stepping 3 length each.  Supine suspension with hand buoys for core strength -knees to chest 3x10 -forward to back pendulum mod assist 3x10 assisted for balance and core focus -Side to side pendulum with mod assist 3x10        PATIENT EDUCATION: Education details: Progress toward LTG; Plan to finish up with Aquatic PT and D/C.  Person educated: Patient and caregiver Education method: Explanation, Demonstration, and Verbal cues Education comprehension: verbalized understanding     HOME EXERCISE PROGRAM: Continue house ambulation w/ caregiver using RW. Consider membership to a pool.  ASSESSMENT:   CLINICAL IMPRESSION:  Focus on core strength with concentratin on simultaneous UE movments submerged.  Pt being DCed.    REHAB POTENTIAL: Good   CLINICAL DECISION MAKING: Stable/uncomplicated  GOALS: Goals reviewed with patient? Yes   SHORT TERM GOALS:   STG Name Target Date Goal status  1 Patient will be able to perform chair to chair transfer with CGA/SBA with use of RW for safety to improve independence. Baseline: min to mod A (12/26/20); CGA/minA (03/09/21) 01/23/2021 03/27/21 Able to transfer STS and stand pivot with SBA, goal met  2 Pt will be able to ambulate 115' with RW with CGA to min A with RW for safety to improve independence Baseline: min to mod A with RW 145'; 230 feet with RW and CGA to MIN A 01/23/2021 03/09/21 Goal met  3 Pt will be able to maintain standing balance for 30 sec to improve static balance Baseline: 10 sec before needing help due to LOB (12/26/20) 01/23/2021 02/05/21 able to stand with S for 30s, goal met  4. Pt will be able to perform sit to stand transfers with SBA to improve independence 04/06/21 Goal met 03/27/21  5. Pt will be able to perform 230 feet of  walking with RW with SBA/CGA to improve household ambulation  04/06/21 05/11/21 542f with RW and CGA, GOAL MET    LONG TERM GOALS:   LTG Name Target Date Goal status  1 Pt will demo >5 points improvement on Berg Balance Scale to improve static balance Baseline: 11/56 01/03/21 05/28/2021 05/11/21 BERG score 41/56; 06/05/21: 30/56 NOT MET  2 Pt will be able to ambulate >250 feet with cane and CGA to improve walking endurance and safety. Baseline: 145' with RW min to mod A; 230 feet with CGA to Min A(revised 05/11/21)  05/28/21 05/11/21 5048fwith RW and CGA; 06/05/21: 300 ft with RW and CGA  PARTIALLY MET  3 Pt  will be able to perform gait speed improvement by 0.1526mto improve community ambulation.  Baseline:0.21ms (03/09/21) 0.54 m/s  05/28/21 Progressing continue 04/03/21 UTA due to achilles partial tear; 06/05/21: 0.54 m/s MET  4 Pt will demo 5 sec improvement in 5x sit to stand (with or without UE support) to improve functional strength Baseline: 5x sit to stand with bil UE 17 sec; 13.8 seconds with BUE support  05/28/21 05/11/21 5x STS 13.6s 06/05/21: 13.8 secs PARTIALLY MET    PLAN: PT FREQUENCY: 2-3x/week   PT DURATION: 8 weeks   PLANNED INTERVENTIONS: Aquatic therapy, Therapeutic exercises, Therapeutic activity, Neuro Muscular re-education, Balance training, Gait training, Patient/Family education, Joint mobilization, Stair training, Visual/preceptual remediation/compensation, Wheelchair mobility training, Cryotherapy, Moist heat, and Manual therapy   PLAN FOR NEXT SESSION:   Finish aquatic PT + D/C from PT services.  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 04/19/2021    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 ([place] 04/19/2021   Thrombosis    Sleep disturbance    Dysphagia, post-stroke    PAF (paroxysmal atrial fibrillation) (HCC)    Acute pulmonary embolism without acute cor pulmonale (HCC)    Malnutrition of moderate degree 11/04/2020   [place]ache[place] care ([place]    Pressure injury of skin 10/23/2020   Intracranial hemorrhage (HCC)    Atrial fibrillation (HCC)    Prediabetes    Leukocytosis    Acute blood loss anemia    Hypernatremia    ICH (intracerebral hemorrhage) ([place] 09/25/2020   Unilateral primary osteoarthritis, left knee 09/26/2016   Atrial flutter ([place] 09/25/2013   Near syncope 09/25/2013  PHYSICAL THERAPY DISCHARGE SUMMARY  Visits from Start of Care: 02/20/21  Current functional level related to goals / functional outcomes: Pt needs assistance with functional mobility and ADL's   Remaining deficits: Ataxia   Education / Equipment: Management of condition, safety   Patient agrees to discharge. Patient goals were partially met. Patient is being discharged due to maximized rehab potential.    MVedia Pereyra MPT 06/15/2021, 2:05 PM  [place] [address]Phone: [phone]  Fax:  [phone]

## 2021-06-19 NOTE — Telephone Encounter (Signed)
Patient's wife returned call. Informed her that the patient portion has to be filled out and signed. She stated they will come by to get that done

## 2021-06-19 NOTE — Telephone Encounter (Signed)
Left voicemail to return call to clinic so patient can fill out patient portion of the handicap placard form

## 2021-07-20 ENCOUNTER — Ambulatory Visit: Payer: Medicare Other | Admitting: Physical Medicine & Rehabilitation

## 2021-08-02 ENCOUNTER — Institutional Professional Consult (permissible substitution): Payer: Medicare Other | Admitting: Student

## 2021-08-07 ENCOUNTER — Encounter: Payer: Self-pay | Admitting: Neurology

## 2021-08-07 ENCOUNTER — Other Ambulatory Visit: Payer: Self-pay | Admitting: *Deleted

## 2021-08-07 MED ORDER — GABAPENTIN 100 MG PO CAPS: 100.0000 mg | ORAL_CAPSULE | Freq: Three times a day (TID) | ORAL | 2 refills | Status: DC

## 2021-08-10 ENCOUNTER — Other Ambulatory Visit: Payer: Self-pay

## 2021-08-10 ENCOUNTER — Encounter (HOSPITAL_BASED_OUTPATIENT_CLINIC_OR_DEPARTMENT_OTHER): Payer: Self-pay | Admitting: Physical Therapy

## 2021-08-10 ENCOUNTER — Ambulatory Visit (HOSPITAL_BASED_OUTPATIENT_CLINIC_OR_DEPARTMENT_OTHER): Payer: Medicare Other | Attending: Physical Medicine & Rehabilitation | Admitting: Physical Therapy

## 2021-08-10 DIAGNOSIS — R2689 Other abnormalities of gait and mobility: Secondary | ICD-10-CM | POA: Insufficient documentation

## 2021-08-10 DIAGNOSIS — R2681 Unsteadiness on feet: Secondary | ICD-10-CM | POA: Insufficient documentation

## 2021-08-10 DIAGNOSIS — R270 Ataxia, unspecified: Secondary | ICD-10-CM | POA: Insufficient documentation

## 2021-08-10 DIAGNOSIS — R262 Difficulty in walking, not elsewhere classified: Secondary | ICD-10-CM | POA: Insufficient documentation

## 2021-08-10 NOTE — Therapy (Signed)
OUTPATIENT PHYSICAL THERAPY TREATMENT NOTE Patient Name: GEOVANNIE VILAR MRN: [id number] DOB:12-04-1955, 66 y.o., male Today's Date: 08/16/2021  PCP: Alroy Dust, L.Marlou Sa, MD REFERRING PROVIDER: Alroy Dust, Carlean Jews.Marlou Sa, MD        Past Medical History:  Diagnosis Date   Atrial flutter [facility])    s/p ablation   DVT (deep venous thrombosis) ([place])    Near syncope 09/25/2013   Paroxysmal atrial fibrillation ([place])    Stroke [facility])    initially hemorragic (not on [place]) followed by subsequent embolic stroke   Past Surgical History:  Procedure Laterality Date   A-FLUTTER ABLATION N/A 03/09/2020   Procedure: A-FLUTTER ABLATION;  Surgeon: Thompson Grayer, MD;  Location: [place] [facility] LAB;  Service: Cardiovascular;  Laterality: N/A;   CARDIOVERSION N/A 09/27/2013   Procedure: CARDIOVERSION;  Surgeon: Lelon Perla, MD;  Location: [facility] ENDOSCOPY;  Service: Cardiovascular;  Laterality: N/A;   IR ANGIO EXTERNAL CAROTID SEL EXT CAROTID UNI R MOD SED  09/26/2020   IR ANGIO INTRA EXTRACRAN SEL COM CAROTID INNOMINATE BILAT MOD SED  09/26/2020   IR ANGIO VERTEBRAL SEL VERTEBRAL UNI R MOD SED  09/26/2020   IR IVC FILTER PLMT / S&I /IMG GUID/MOD SED  10/08/2020   IR IVUS EACH ADDITIONAL NON CORONARY VESSEL  11/02/2020   IR PTA VENOUS EXCEPT DIALYSIS CIRCUIT  11/02/2020   IR RADIOLOGIST EVAL & MGMT  01/17/2021   IR THROMBECT VENO MECH MOD SED  10/27/2020   IR THROMBECT VENO MECH MOD SED  11/02/2020   IR US GUIDE VASC ACCESS LEFT  10/27/2020   IR US GUIDE VASC ACCESS RIGHT  09/26/2020   IR US GUIDE VASC ACCESS RIGHT  11/02/2020   IR VENO/EXT/UNI RIGHT  11/02/2020   IR VENOCAVAGRAM IVC  10/27/2020   RETINAL DETACHMENT SURGERY     right knee arthroscopy     TEE WITHOUT CARDIOVERSION N/A 09/27/2013   Procedure: TRANSESOPHAGEAL ECHOCARDIOGRAM (TEE);  Surgeon: Lelon Perla, MD;  Location: [facility] ENDOSCOPY;  Service: Cardiovascular;  Laterality: N/A;   WRIST SURGERY     Patient Active Problem List   Diagnosis Date Noted    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   Residual cognitive deficit as late effect of stroke 04/19/2021   Hemiparesis affecting right side as late effect of stroke ([place]) 04/19/2021   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 04/19/2021   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 ([place]) 04/19/2021   Thrombosis    Sleep disturbance    Dysphagia, post-stroke    PAF (paroxysmal atrial fibrillation) ([place])    Acute pulmonary embolism without acute cor pulmonale (HCC)    Malnutrition of moderate degree 11/04/2020   Tracheostomy care ([place])    Pressure injury of skin 10/23/2020   Intracranial hemorrhage (HCC)    Atrial fibrillation ([place])    Prediabetes    Leukocytosis    Acute blood loss anemia    Hypernatremia    ICH (intracerebral hemorrhage) ([place]) 09/25/2020   Unilateral primary osteoarthritis, left knee 09/26/2016   Atrial flutter ([place]) 09/25/2013   Near syncope 09/25/2013   See Note below for Objective Data and Assessment of Progress/Goals.  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  Person educated: Patient and caregiver Education method: Explanation, Demonstration, and Verbal cues Education comprehension: verbalized understanding     HOME EXERCISE PROGRAM: Continue house ambulation w/ caregiver using RW. Consider membership to a pool.  ASSESSMENT:   CLINICAL IMPRESSION:  Pt arrives for scheduled aquatic visit made prior to DC in December and in error not cancelled.  Spoke with pt and wife in regards to membership at [facility] for use of pool for  completion of HEP and suggested using a Physiological scientist.  The vu.   REHAB POTENTIAL: Good   CLINICAL DECISION MAKING: Stable/uncomplicated  GOALS: Goals reviewed with patient? Yes   SHORT TERM GOALS:   STG Name Target Date Goal status  1 Patient will be able to perform chair to chair transfer with CGA/SBA with use of RW for safety to improve independence. Baseline: min to mod A (12/26/20); CGA/minA (03/09/21) 01/23/2021 03/27/21 Able to transfer STS and stand pivot with SBA, goal met  2 Pt will be able to ambulate 115' with RW with CGA to min A with RW for safety to improve independence Baseline: min to mod A with RW 145'; 230 feet with RW and CGA to MIN A 01/23/2021 03/09/21 Goal met  3 Pt will be able to maintain standing balance for 30 sec to improve static balance Baseline: 10 sec before needing help due to LOB (12/26/20) 01/23/2021 02/05/21 able to stand with S for 30s, goal met  4. Pt will be able to perform sit to stand transfers with SBA to improve independence 04/06/21 Goal met 03/27/21  5. Pt will be able to perform 230 feet of walking with RW with SBA/CGA to improve household ambulation  04/06/21 05/11/21 515f with RW and CGA, GOAL MET    LONG TERM GOALS:   LTG Name Target Date Goal status  1 Pt will demo >5 points improvement on Berg Balance Scale to improve static balance Baseline: 11/56 01/03/21 05/28/2021 05/11/21 BERG score 41/56; 06/05/21: 30/56 NOT MET  2 Pt will be able to ambulate >250 feet with cane and CGA to improve walking endurance and safety. Baseline: 145' with RW min to mod A; 230 feet with CGA to Min A(revised 05/11/21)  05/28/21 05/11/21 5045fwith RW and CGA; 06/05/21: 300 ft with RW and CGA  PARTIALLY MET  3 Pt  will be able to perform gait speed improvement by 0.1558mto improve community ambulation. Baseline:0.39m32m9/9/22) 0.54 m/s  05/28/21 Progressing continue 04/03/21 UTA due to achilles partial tear; 06/05/21: 0.54 m/s MET  4 Pt will demo 5 sec improvement  in 5x sit to stand (with or without UE support) to improve functional strength Baseline: 5x sit to stand with bil UE 17 sec; 13.8 seconds with BUE support  05/28/21 05/11/21 5x STS 13.6s 06/05/21: 13.8 secs PARTIALLY MET    PLAN: PT FREQUENCY: 2-3x/week   PT DURATION: 8 weeks   PLANNED INTERVENTIONS: Aquatic therapy, Therapeutic exercises, Therapeutic activity, Neuro Muscular re-education, Balance training, Gait training, Patient/Family education, Joint mobilization, Stair training, Visual/preceptual remediation/compensation, Wheelchair mobility training, Cryotherapy, Moist heat, and Manual therapy   PLAN FOR NEXT SESSION:   Finish aquatic PT + D/C from PT services.  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 04/19/2021   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 ([place]) 04/19/2021   Thrombosis    Sleep disturbance    Dysphagia, post-stroke    PAF (paroxysmal atrial fibrillation) (HCC)    Acute pulmonary embolism without acute cor pulmonale (HCC)    Malnutrition of moderate degree 11/04/2020   Tracheostomy care ([place])    Pressure injury of skin 10/23/2020   Intracranial hemorrhage (HCC)    Atrial fibrillation (HCC)    Prediabetes    Leukocytosis    Acute blood loss anemia    Hypernatremia    ICH (intracerebral hemorrhage) ([place]) 09/25/2020   Unilateral primary osteoarthritis, left knee 09/26/2016   Atrial flutter ([place]) 09/25/2013   Near syncope 09/25/2013  PHYSICAL THERAPY DISCHARGE SUMMARY  Visits from Start of Care: 02/20/21  Current functional level related to goals  / functional outcomes: Pt needs assistance with functional mobility and ADL's   Remaining deficits: Ataxia   Education / Equipment: Management of condition, safety   Patient agrees to discharge. Patient goals were partially met. Patient is being discharged due to maximized rehab potential.    Vedia Pereyra, MPT 08/16/2021, 8:42 AM  [facility] [address] [place], [place], [address] Phone: [phone]   Fax:  [phone]

## 2021-08-13 NOTE — Progress Notes (Signed)
Synopsis: Referred for post hospitalization followo up by Alroy Dust, L.Marlou Sa, MD  Subjective:   PATIENT ID: Walter [name] GENDER: male DOB: [birth year], MRN: [id number]  Chief Complaint  Patient presents with   Consult    Pt had a hemorrhagic stroke 09/25/20 and was in the hospital followed by rehab. Pt was then in rehab at [facility] in [place]. Pt states while at rehab, they noticed he was a little SOB and also states when he sleeps, he does make noises while sleeping and also snores which he is using a dental appliance to help with that.   66yM with history of AF/prior ablation, acute IPH c/b R hemiplegia and VDRF, tracheostomy placement s/p decannulation 11/09/20, LE DVT c/b obstructed/clotted IVC filter requiring mechanical thrombectomy and removal of IVC filter - now on eliquis 5 BID. Discharged to [facility] 10/28/20, then home 11/23/20. Still has substantial difficulty with balance, falls to left frequently.  He says he used to work out frequently before his hemorrhagic stroke. Has been told that he was a little 'breathy' in rehab. His wife says he frequently sounds like he's panting. Has not done nearly the same amount of cardio now that he was doing before stroke. He has no cough, no chest congestion. Does have trouble swallowing some liquids but tries to enact measures he's learned to minimize aspiration.   Had sleep study this year done ordered by his cardiologist. He thinks it showed mild OSA. He is unsure if there was CSA on it.   Otherwise pertinent review of systems is negative.  Never smoker, worked in Personal assistant. Has dog at home.   Past Medical History:  Diagnosis Date   Atrial flutter [facility])    s/p ablation   DVT (deep venous thrombosis) (HCC)    Near syncope 09/25/2013   Paroxysmal atrial fibrillation (HCC)    Stroke [facility])    initially hemorragic (not on [place]) followed by subsequent embolic stroke     Family History  Problem Relation Age of Onset   Heart disease Mother     Heart disease Father    Healthy Sister    Healthy Brother      Past Surgical History:  Procedure Laterality Date   A-FLUTTER ABLATION N/A 03/09/2020   Procedure: A-FLUTTER ABLATION;  Surgeon: Thompson Grayer, MD;  Location: [place] [facility] LAB;  Service: Cardiovascular;  Laterality: N/A;   CARDIOVERSION N/A 09/27/2013   Procedure: CARDIOVERSION;  Surgeon: Lelon Perla, MD;  Location: [facility] ENDOSCOPY;  Service: Cardiovascular;  Laterality: N/A;   IR ANGIO EXTERNAL CAROTID SEL EXT CAROTID UNI R MOD SED  09/26/2020   IR ANGIO INTRA EXTRACRAN SEL COM CAROTID INNOMINATE BILAT MOD SED  09/26/2020   IR ANGIO VERTEBRAL SEL VERTEBRAL UNI R MOD SED  09/26/2020   IR IVC FILTER PLMT / S&I /IMG GUID/MOD SED  10/08/2020   IR IVUS EACH ADDITIONAL NON CORONARY VESSEL  11/02/2020   IR PTA VENOUS EXCEPT DIALYSIS CIRCUIT  11/02/2020   IR RADIOLOGIST EVAL & MGMT  01/17/2021   IR THROMBECT VENO MECH MOD SED  10/27/2020   IR THROMBECT VENO MECH MOD SED  11/02/2020   IR US GUIDE VASC ACCESS LEFT  10/27/2020   IR US GUIDE VASC ACCESS RIGHT  09/26/2020   IR US GUIDE VASC ACCESS RIGHT  11/02/2020   IR VENO/EXT/UNI RIGHT  11/02/2020   IR VENOCAVAGRAM IVC  10/27/2020   RETINAL DETACHMENT SURGERY     right knee arthroscopy     TEE WITHOUT CARDIOVERSION  N/A 09/27/2013   Procedure: TRANSESOPHAGEAL ECHOCARDIOGRAM (TEE);  Surgeon: Lelon Perla, MD;  Location: [facility] ENDOSCOPY;  Service: Cardiovascular;  Laterality: N/A;   WRIST SURGERY      Social History   Socioeconomic History   Marital status: Married    Spouse name: cathy   Number of children: Not on file   Years of education: Not on file   Highest education level: Not on file  Occupational History   Not on file  Tobacco Use   Smoking status: Never   Smokeless tobacco: Never  Vaping Use   Vaping Use: Never used  Substance and Sexual Activity   Alcohol use: Yes    Comment: occasionally   Drug use: No   Sexual activity: Not on file  Other Topics Concern   Not on file   Social History Narrative   Lives with wife at home   Right Handed   Drinks decaf   Social Determinants of Health   Financial Resource Strain: Not on file  Food Insecurity: Not on file  Transportation Needs: Not on file  Physical Activity: Not on file  Stress: Not on file  Social Connections: Not on file  Intimate Partner Violence: Not on file     Allergies  Allergen Reactions   Keppra [Levetiracetam] Swelling    Patient experienced angioedema post inpatient keppra dose   Latex Itching    Skin turns real red   Topiramate Other (See Comments)    Confusion, difficulty communicating Other reaction(s): confusion     Outpatient Medications Prior to Visit  Medication Sig Dispense Refill   apixaban (ELIQUIS) 5 MG TABS tablet TAKE 1 TABLET BY MOUTH TWICE A DAY 60 tablet 6   atorvastatin (LIPITOR) 20 MG tablet Take 20 mg by mouth at bedtime.     diltiazem (CARDIZEM CD) 180 MG 24 hr capsule Take 1 capsule (180 mg total) by mouth daily. 90 capsule 3   EPINEPHrine 0.3 mg/0.3 mL IJ SOAJ injection      gabapentin (NEURONTIN) 100 MG capsule Take 1 capsule (100 mg total) by mouth 3 (three) times daily. 90 capsule 2   mirtazapine (REMERON) 7.5 MG tablet TAKE 2 TABLETS BY MOUTH AT BEDTIME 180 tablet 1   sertraline (ZOLOFT) 100 MG tablet Take 100 mg by mouth daily.     clonazepam (KLONOPIN) 0.125 MG disintegrating tablet Take 1 tablet (0.125 mg total) by mouth at bedtime as needed (sleep). (Patient taking differently: Take 0.125 mg by mouth as needed (sleep).) 20 tablet 0   desloratadine (CLARINEX) 5 MG tablet Take 5 mg by mouth daily.     ketoconazole (NIZORAL) 2 % cream Apply topically.     No facility-administered medications prior to visit.       Objective:   Physical Exam:  General appearance: 66 y.o., male, NAD, conversant  Eyes: anicteric sclerae; PERRL, tracking appropriately HENT: NCAT; MMM Neck: Trachea midline; no lymphadenopathy, no JVD Lungs: CTAB, no crackles, no  wheeze, with normal respiratory effort CV: RRR, no murmur  Abdomen: Soft, non-tender; non-distended, BS present  Extremities: No peripheral edema, warm Skin: Normal turgor and texture; no rash Psych: Appropriate affect Neuro: Alert and oriented to person and place, no focal deficit     Vitals:   08/15/21 1116  BP: 120/70  Pulse: 71  SpO2: 94%  Weight: 226 lb (102.5 kg)  Height: 6' (1.829 m)   94% on RA BMI Readings from Last 3 Encounters:  08/15/21 30.65 kg/m  08/14/21 28.17 kg/m  04/19/21  25.00 kg/m   Wt Readings from Last 3 Encounters:  08/15/21 226 lb (102.5 kg)  08/14/21 225 lb 6.4 oz (102.2 kg)  04/19/21 200 lb (90.7 kg)     CBC    Component Value Date/Time   WBC 4.4 11/20/2020 0621   RBC 3.72 (L) 11/20/2020 0621   HGB 12.0 (L) 11/20/2020 0621   HGB 14.8 03/02/2020 0853   HCT 37.2 (L) 11/20/2020 0621   HCT 43.2 03/02/2020 0853   PLT 290 11/20/2020 0621   PLT 209 03/02/2020 0853   MCV 100.0 11/20/2020 0621   MCV 96 03/02/2020 0853   MCH 32.3 11/20/2020 0621   MCHC 32.3 11/20/2020 0621   RDW 14.7 11/20/2020 0621   RDW 12.6 03/02/2020 0853   LYMPHSABS 1.2 11/20/2020 0621   LYMPHSABS 2.2 03/02/2020 0853   MONOABS 0.3 11/20/2020 0621   EOSABS 0.1 11/20/2020 0621   EOSABS 0.2 03/02/2020 0853   BASOSABS 0.0 11/20/2020 0621   BASOSABS 0.1 03/02/2020 0853      Chest Imaging: CT Chest w contrast 10/17/20 reviewed by me with PE , multifocal scar   FNL 12/12/20:    On fiberoptic laryngoscopy the vocal cords were clear bilaterally with  normal vocal cord mobility.  He had mild edema of the posterior vocal  cords over the arytenoid processes but this was minimal and symmetric.   CXR reviewed by me today unremarkable   Pulmonary Functions Testing Results: No flowsheet data found.   WatchPAT 04/03/21:  Mild OSA AHI 11.3, O2 nadir 81%, <88% for 9 minutes   Echocardiogram:  TTE 09/25/20:  1. Left ventricular ejection fraction, by estimation, is 55 to  60%. The  left ventricle has normal function. The left ventricle has no regional  wall motion abnormalities. Left ventricular diastolic parameters are  consistent with Grade I diastolic  dysfunction (impaired relaxation).   2. Right ventricular systolic function is normal. The right ventricular  size is normal. There is normal pulmonary artery systolic pressure. The  estimated right ventricular systolic pressure is [id number] mmHg.   3. The mitral valve is normal in structure. No evidence of mitral valve  regurgitation. No evidence of mitral stenosis.   4. The aortic valve is tricuspid. Aortic valve regurgitation is not  visualized. No aortic stenosis is present.   5. The inferior vena cava is dilated in size with <50% respiratory  variability, suggesting right atrial pressure of 15 mmHg.      Assessment & Plan:   # Panting/?dysfunctional breathing pattern # DOE Unsure if this represents a central dysfunctional breathing pattern related to [place]. WatchPAT didn't pick up evidence of significant component of central sleep apnea (~1.1/hr). Will at least make sure not affecting oxygenation, ventilation.   # OSA: Using mandibular advancement device, didn't tolerate CPAP well.  Plan: - PFT, ABG - BMP, BNP, cbc/diff, tsh  RTC 4 weeks    Maryjane Hurter, MD [facility] 08/15/2021 7:53 PM

## 2021-08-14 ENCOUNTER — Encounter: Payer: Self-pay | Admitting: Physical Medicine & Rehabilitation

## 2021-08-14 ENCOUNTER — Other Ambulatory Visit: Payer: Self-pay

## 2021-08-14 ENCOUNTER — Encounter: Payer: Medicare Other | Attending: Physical Medicine & Rehabilitation | Admitting: Physical Medicine & Rehabilitation

## 2021-08-14 VITALS — BP 122/75 | HR 84 | Temp 97.5°F | Ht 75.0 in | Wt 225.4 lb

## 2021-08-14 DIAGNOSIS — I69351 Hemiplegia and hemiparesis following cerebral infarction affecting right dominant side: Secondary | ICD-10-CM | POA: Insufficient documentation

## 2021-08-14 DIAGNOSIS — I69193 Ataxia following nontraumatic intracerebral hemorrhage: Secondary | ICD-10-CM | POA: Diagnosis not present

## 2021-08-14 DIAGNOSIS — I613 Nontraumatic intracerebral hemorrhage in brain stem: Secondary | ICD-10-CM | POA: Diagnosis not present

## 2021-08-14 DIAGNOSIS — G890 Central pain syndrome: Secondary | ICD-10-CM | POA: Insufficient documentation

## 2021-08-14 NOTE — Patient Instructions (Addendum)
Please call if any issues with therapy

## 2021-08-14 NOTE — Progress Notes (Signed)
Subjective:    Patient ID: Walter [name], male    DOB: [birth year], 66 y.o.   MRN: [id number] 67 y.o. right-handed male with history of atrial fibrillation with ablation not on anticoagulation.  Lives with spouse independent prior to admission working as a Cabin crew.  Presented 09/17/2020 with right side weakness headache and dysarthria as well as vomiting.  Cranial CT/MRI showed acute intraparenchymal hemorrhage in the dorsal left pons with subarachnoid extension into the cerebral aqueduct and fourth ventricle.  CT angiogram head and neck no vascular malformation anomaly or aneurysm.  Echocardiogram with ejection fraction of 55 to [id number] grade 1 diastolic dysfunction no regional wall motion abnormalities.  Admission chemistries unremarkable except glucose 169 alcohol negative urine drug screen negative.  Patient did require emergent intubation for airway protection.  Hospital course complicated by hydrocephalus with diagnostic cerebral angiogram completed showing no evidence of aneurysm.  EVD was placed per neurosurgery.  Maintained on 3% hypertonic saline.  Patient extubated 09/28/2020.  Follow-up cardiology services for atrial fibrillation maintained on Cardizem.  Patient was cleared to begin Lovenox for DVT prophylaxis 09/28/2020.  Repeat head CT scan 09/29/2020 showed new intraventricular hemorrhage into the third and lateral ventricles.  Fourth ventricular and midbrain hemorrhage unchanged and again repeated 10/24/2020 showing improved hemorrhage and edema along the shunt tract in the right frontal lobe.  No new area of hemorrhage.  Patient with long-term ventilatory support underwent tracheostomy 10/04/2020 per Dr. Tacy Learn critical care services.  On 10/08/2020 patient found to have bilateral lower extremity DVTs underwent placement of IVC filter per interventional radiology.  Dopplers again completed 10/25/2020 showing acute DVT right common femoral vein and SF junction and femoral vein proximal right profunda vein  right popliteal vein and right posterior tibial vein right peroneal vein right soleal vein and right gastrocnemius vein it was established the patient had an IVC filter maintained on Eliquis which was also initiated.  Patient with fever of unknown origin infectious disease consulted broad-spectrum antibiotics initiated.  SARS coronavirus negative.  CT chest abdomen pelvis did show bilateral large volume right greater than left pulmonary emboli no evidence of right heart strain and was discussed to continue IVC filter as well as Eliquis.  Interventional radiology consulted to discuss possible removal of IVC filter with CT venogram 10/27/2020 showing thrombus involving the IVC and iliac veins.  Large amount of thrombus involving the infrarenal IVC and IVC filter.  Suprarenal IVC was patent.  Thrombus in the right iliac veins and thrombus in the proximal femoral veins bilaterally and plan was to undergo thrombectomy and retrieval of IVC filter 10/27/2020 per Dr. Vernard Gambles of interventional radiology.  Patient initially n.p.o. with alternative means of nutritional support diet slowly advance.    Admit date: 10/28/2020 Discharge date: 11/23/2020 HPI 66 year old male here for follow-up after CVA.  His primary symptoms are left-sided ataxia while his right-sided dysesthesias primarily affecting right upper extremity.  He continues to use a walker to ambulate.  He went through a rehabilitation program at [facility] in [place], 2 weeks inpatient and 2 weeks outpatient.  He is accompanied by his wife.  His wife does all the driving.  Patient continues to have diplopia and has seen at least 2 neuro-ophthalmologist.  He has had prisms added to his lenses as well as the medial aspect of his eyeglasses taped. The patient has not had any falls at home.  He would like to get back into therapy.  Most recently had aquatic therapy with physical therapy   CLINICAL  DATA:  Intracranial hemorrhage   EXAM: MRI HEAD WITHOUT AND  WITH CONTRAST   MRV HEAD WITHOUT AND WITH CONTRAST   TECHNIQUE: Multiplanar, multiecho pulse sequences of the brain and surrounding structures were obtained without and with intravenous contrast. Angiographic images of the intracranial venous structures were obtained using MRV technique without and with intravenous contrast.   COMPARISON:  None.   CONTRAST:  50mL GADAVIST GADOBUTROL 1 MMOL/ML IV SOLN   FINDINGS: MR HEAD FINDINGS   Brain: As seen on CT imaging, there is an approximately 1.4 x 0.9 x 1.8 cm area of hemorrhage centered within the left dorsal aspect of the midbrain extending into the dorsal pons and inframedial thalamus with mild surrounding edema. Hemorrhage appears acute by signal characteristics. Intraventricular extension is noted by the cerebral aqueduct and the fourth ventricle. There is no abnormal enhancement.   Compression of the cerebral aqueduct with mild prominence of third and lateral ventricles suggesting a degree of obstructive hydrocephalus.   Vascular: Major vessel flow voids at the skull base are preserved.   Skull and upper cervical spine: Normal marrow signal is preserved.   Sinuses/Orbits: Mild mucosal thickening a. Orbits are unremarkable.   Other: Sella is unremarkable.  Mastoid air cells are clear.   MRV HEAD FINDINGS   Superior sagittal sinus, straight sinus, vein of Galen, and internal cerebral veins are patent. There is attenuation of the left basal vein in the quadrigeminal cistern without definite occlusion. Similar appearance on prior CTA. Both transverse and sigmoid sinuses are patent.   IMPRESSION: Acute hemorrhage centered within the left dorsal midbrain extending into the dorsal pons and inferomedial thalamus with intraventricular extension. No abnormal enhancement to suggest underlying lesion.   Compression of the cerebral aqueduct with possible mild obstructive hydrocephalus.   No evidence of dural sinus thrombosis.  Attenuation of the left basal vein in the quadrigeminal cistern without definite occlusion.     Electronically Signed   By: Guadlupe Spanish M.D.   On: 09/25/2020 16:43 Pain Inventory Average Pain 0 Pain Right Now 0 My pain is  no pain  BOWEL Number of stools per week: 7 Oral laxative use Yes  Type of laxative miralax   BLADDER Normal  Mobility walk with assistance use a walker how many minutes can you walk? 5 ability to climb steps?  yes do you drive?  no  Function retired  Neuro/Psych weakness numbness tingling trouble walking  Prior Studies Any changes since last visit?  no  Physicians involved in your care Any changes since last visit?  no   Family History  Problem Relation Age of Onset   Heart disease Mother    Heart disease Father    Healthy Sister    Healthy Brother    Social History   Socioeconomic History   Marital status: Married    Spouse name: cathy   Number of children: Not on file   Years of education: Not on file   Highest education level: Not on file  Occupational History   Not on file  Tobacco Use   Smoking status: Never   Smokeless tobacco: Never  Vaping Use   Vaping Use: Never used  Substance and Sexual Activity   Alcohol use: Yes    Comment: occasionally   Drug use: No   Sexual activity: Not on file  Other Topics Concern   Not on file  Social History Narrative   Lives with wife at home   Right Handed   Drinks decaf  Social Determinants of Health   Financial Resource Strain: Not on file  Food Insecurity: Not on file  Transportation Needs: Not on file  Physical Activity: Not on file  Stress: Not on file  Social Connections: Not on file   Past Surgical History:  Procedure Laterality Date   A-FLUTTER ABLATION N/A 03/09/2020   Procedure: A-FLUTTER ABLATION;  Surgeon: Thompson Grayer, MD;  Location: [place] [facility];  Service: Cardiovascular;  Laterality: N/A;   CARDIOVERSION N/A 09/27/2013   Procedure:  CARDIOVERSION;  Surgeon: Lelon Perla, MD;  Location: [facility] ENDOSCOPY;  Service: Cardiovascular;  Laterality: N/A;   IR ANGIO EXTERNAL CAROTID SEL EXT CAROTID UNI R MOD SED  09/26/2020   IR ANGIO INTRA EXTRACRAN SEL COM CAROTID INNOMINATE BILAT MOD SED  09/26/2020   IR ANGIO VERTEBRAL SEL VERTEBRAL UNI R MOD SED  09/26/2020   IR IVC FILTER PLMT / S&I /IMG GUID/MOD SED  10/08/2020   IR IVUS EACH ADDITIONAL NON CORONARY VESSEL  11/02/2020   IR PTA VENOUS EXCEPT DIALYSIS CIRCUIT  11/02/2020   IR RADIOLOGIST EVAL & MGMT  01/17/2021   IR THROMBECT VENO MECH MOD SED  10/27/2020   IR THROMBECT VENO MECH MOD SED  11/02/2020   IR US GUIDE VASC ACCESS LEFT  10/27/2020   IR US GUIDE VASC ACCESS RIGHT  09/26/2020   IR US GUIDE VASC ACCESS RIGHT  11/02/2020   IR VENO/EXT/UNI RIGHT  11/02/2020   IR VENOCAVAGRAM IVC  10/27/2020   RETINAL DETACHMENT SURGERY     right knee arthroscopy     TEE WITHOUT CARDIOVERSION N/A 09/27/2013   Procedure: TRANSESOPHAGEAL ECHOCARDIOGRAM (TEE);  Surgeon: Lelon Perla, MD;  Location: [facility] ENDOSCOPY;  Service: Cardiovascular;  Laterality: N/A;   WRIST SURGERY     Past Medical History:  Diagnosis Date   Atrial flutter ([place])    s/p ablation   DVT (deep venous thrombosis) ([place])    Near syncope 09/25/2013   Paroxysmal atrial fibrillation ([place])    Stroke ([place])    initially hemorragic (not on [place]) followed by subsequent embolic stroke   BP [id number]    Pulse 84    Temp (!) 97.5 F (36.4 C)    Ht 6\' 3"  (1.905 m)    Wt 225 lb 6.4 oz (102.2 kg)    SpO2 96%    BMI 28.17 kg/m   Opioid Risk Score:   Fall Risk Score:  `1  Depression screen PHQ 2/9  Depression screen [facility] 2/9 08/14/2021 04/19/2021 01/09/2021 11/30/2020  Decreased Interest 0 1 1 1   Down, Depressed, Hopeless 0 1 1 1   PHQ - 2 Score 0 2 2 2   Altered sleeping - - - 3  Tired, decreased energy - - - 3  Change in appetite - - - 0  Feeling bad or failure about yourself  - - - 1  Trouble concentrating - - - 3  Moving slowly or  fidgety/restless - - - 3  Suicidal thoughts - - - 1  PHQ-9 Score - - - 16  Difficult doing work/chores - - - Somewhat difficult  Some recent data might be hidden     Review of Systems  Constitutional: Negative.   HENT: Negative.    Eyes: Negative.   Respiratory:  Positive for apnea, shortness of breath and wheezing.   Cardiovascular: Negative.   Gastrointestinal: Negative.   Endocrine: Negative.   Genitourinary: Negative.   Musculoskeletal:  Positive for gait problem.  Skin: Negative.   Allergic/Immunologic: Negative.  Neurological:  Positive for weakness and numbness.       Tingling  Hematological:  Bruises/bleeds easily.       Eliquis  Psychiatric/Behavioral: Negative.    All other systems reviewed and are negative.     Objective:   Physical Exam Vitals and nursing note reviewed.  Constitutional:      Appearance: He is normal weight.  HENT:     Head: Normocephalic and atraumatic.  Eyes:     Extraocular Movements: Extraocular movements intact.     Pupils: Pupils are equal, round, and reactive to light.  Skin:    General: Skin is warm and dry.  Neurological:     Mental Status: He is alert and oriented to person, place, and time.     Cranial Nerves: Dysarthria present.     Sensory: Sensory deficit present.     Motor: Weakness present.     Coordination: Coordination abnormal. Finger-Nose-Finger Test abnormal and Heel to [facility] Test abnormal. Impaired rapid alternating movements.     Gait: Gait abnormal.     Comments: Motor strength is 5/5 in the left deltoid bicep tricep grip hip flexor knee extensor ankle dorsiflexor 4/5 in the right deltoid) repeat flexion extensor ankle dorsiflexion Sensation reduced right  Psychiatric:        Mood and Affect: Mood normal.        Behavior: Behavior normal.  Skew deviation OS Has difficulty with lateral gaze on the left as well compared to the right side. Sensation reduced pinprick  in the right lower extremity equal sensation in  the upper extremities with some hyperalgesia in the right upper        Assessment & Plan:  #1.  Brainstem bleed involving the left midbrain, pons as well as left thalamus.  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 As discussed with patient and his wife we are reaching a plateau in his functioning.  He needs to keep up with general conditioning and continue his home exercise program on a ongoing basis.  He wishes to continue formal work with the PT at this point private pay. Physical medicine rehab follow-up in 6 months  2.  Hyperalgesia on the right side related to his thalamic involvement.  Neurology prescribing gabapentin.  Discussed that this will likely require additional dosing titrations.

## 2021-08-15 ENCOUNTER — Ambulatory Visit (INDEPENDENT_AMBULATORY_CARE_PROVIDER_SITE_OTHER): Payer: Medicare Other | Admitting: Student

## 2021-08-15 ENCOUNTER — Ambulatory Visit (INDEPENDENT_AMBULATORY_CARE_PROVIDER_SITE_OTHER): Payer: Medicare Other

## 2021-08-15 ENCOUNTER — Encounter: Payer: Self-pay | Admitting: Student

## 2021-08-15 VITALS — BP 120/70 | HR 71 | Ht 72.0 in | Wt 226.0 lb

## 2021-08-15 DIAGNOSIS — R0600 Dyspnea, unspecified: Secondary | ICD-10-CM

## 2021-08-15 DIAGNOSIS — Z9889 Other specified postprocedural states: Secondary | ICD-10-CM

## 2021-08-15 LAB — CBC WITH DIFFERENTIAL/PLATELET
Basophils Absolute: 0 10*3/uL (ref 0.0–0.1)
Basophils Relative: 0.7 % (ref 0.0–3.0)
Eosinophils Absolute: 0.1 10*3/uL (ref 0.0–0.7)
Eosinophils Relative: 2.2 % (ref 0.0–5.0)
HCT: 45.6 % (ref 39.0–52.0)
Hemoglobin: 15.1 g/dL (ref 13.0–17.0)
Lymphocytes Relative: 35.8 % (ref 12.0–46.0)
Lymphs Abs: 2 10*3/uL (ref 0.7–4.0)
MCHC: 33.1 g/dL (ref 30.0–36.0)
MCV: 94.1 fl (ref 78.0–100.0)
Monocytes Absolute: 0.4 10*3/uL (ref 0.1–1.0)
Monocytes Relative: 7.7 % (ref 3.0–12.0)
Neutro Abs: 3 10*3/uL (ref 1.4–7.7)
Neutrophils Relative %: 53.6 % (ref 43.0–77.0)
Platelets: 237 10*3/uL (ref 150.0–400.0)
RBC: 4.85 Mil/uL (ref 4.22–5.81)
RDW: 14.4 % (ref 11.5–15.5)
WBC: 5.5 10*3/uL (ref 4.0–10.5)

## 2021-08-15 LAB — BRAIN NATRIURETIC PEPTIDE: Pro B Natriuretic peptide (BNP): 32 pg/mL (ref 0.0–100.0)

## 2021-08-15 LAB — TSH: TSH: 1.92 u[IU]/mL (ref 0.35–5.50)

## 2021-08-15 NOTE — Patient Instructions (Addendum)
-   X ray today - BMP, BNP, CBC/diff, TSH - PFTs, arterial blood gas you will be called to schedule

## 2021-08-16 ENCOUNTER — Ambulatory Visit (INDEPENDENT_AMBULATORY_CARE_PROVIDER_SITE_OTHER): Payer: Medicare Other | Admitting: Internal Medicine

## 2021-08-16 ENCOUNTER — Other Ambulatory Visit: Payer: Self-pay

## 2021-08-16 ENCOUNTER — Telehealth: Payer: Self-pay

## 2021-08-16 ENCOUNTER — Encounter: Payer: Self-pay | Admitting: Internal Medicine

## 2021-08-16 VITALS — BP 124/64 | HR 77 | Ht 72.0 in | Wt 225.0 lb

## 2021-08-16 DIAGNOSIS — I10 Essential (primary) hypertension: Secondary | ICD-10-CM

## 2021-08-16 DIAGNOSIS — E785 Hyperlipidemia, unspecified: Secondary | ICD-10-CM

## 2021-08-16 DIAGNOSIS — I480 Paroxysmal atrial fibrillation: Secondary | ICD-10-CM | POA: Diagnosis not present

## 2021-08-16 LAB — BASIC METABOLIC PANEL
BUN: 16 mg/dL (ref 6–23)
CO2: 27 mEq/L (ref 19–32)
Calcium: 9.8 mg/dL (ref 8.4–10.5)
Chloride: 102 mEq/L (ref 96–112)
Creatinine, Ser: 0.94 mg/dL (ref 0.40–1.50)
GFR: 84.83 mL/min (ref >60.00)
Glucose, Bld: 81 mg/dL (ref 70–99)
Potassium: 4.3 mEq/L (ref 3.5–5.1)
Sodium: 138 mEq/L (ref 135–145)

## 2021-08-16 MED ORDER — DILTIAZEM HCL ER COATED BEADS 120 MG PO CP24: 120.0000 mg | ORAL_CAPSULE | Freq: Every day | ORAL | 3 refills | Status: DC

## 2021-08-16 MED ORDER — ATORVASTATIN CALCIUM 20 MG PO TABS: 20.0000 mg | ORAL_TABLET | Freq: Every day | ORAL | 3 refills | Status: AC

## 2021-08-16 NOTE — Patient Instructions (Addendum)
Medication Instructions:  Your physician has recommended you make the following change in your medication:    Decrease your Cardizem CD -Take 120 mg by mouth once a day  Labwork: You will get lab work at your next office visit:  lipid profile (fasting), liver panel and BMP  Testing/Procedures: None ordered.  Follow-Up: Your physician wants you to follow-up in: 4 months with Lewayne Bunting, MD    Any Other Special Instructions Will Be Listed Below (If Applicable).  If you need a refill on your cardiac medications before your next appointment, please call your pharmacy.

## 2021-08-16 NOTE — Telephone Encounter (Signed)
Per Dr. Taylor, called to arrange Watchman consult with Dr. Lambert.  Left message to call back. 

## 2021-08-16 NOTE — Progress Notes (Signed)
HPI Walter [name] is referred for ongoing evaluation by Dr. Rayann [name]. He is a pleasant 66 yo man with a h/o atrial flutter s/p ablation who developed very rare episodes of atrial fib. His CHADSVASC score was 1-2 based on age and mild calcifications in the abdominal aorta. He also has a h/o DVT and I ss/p IVC filter, requring extensive thrombectomy. The patient was restarted on Eliquis after the acute DVT. He has remained on this medication. He has undergone extensive rehab at the [facility] in [place], [place]. He feels well but is still limited. His ICH was not thought to be due to an embolic event with tranformation but a primary bleed.  Allergies  Allergen Reactions   Keppra [Levetiracetam] Swelling    Patient experienced angioedema post inpatient keppra dose   Latex Itching    Skin turns real red   Topiramate Other (See Comments)    Confusion, difficulty communicating Other reaction(s): confusion     Current Outpatient Medications  Medication Sig Dispense Refill   apixaban (ELIQUIS) 5 MG TABS tablet TAKE 1 TABLET BY MOUTH TWICE A DAY 60 tablet 6   diltiazem (CARDIZEM CD) 120 MG 24 hr capsule Take 1 capsule (120 mg total) by mouth daily. 90 capsule 3   EPINEPHrine 0.3 mg/0.3 mL IJ SOAJ injection      gabapentin (NEURONTIN) 100 MG capsule Take 1 capsule (100 mg total) by mouth 3 (three) times daily. 90 capsule 2   mirtazapine (REMERON) 7.5 MG tablet TAKE 2 TABLETS BY MOUTH AT BEDTIME 180 tablet 1   sertraline (ZOLOFT) 100 MG tablet Take 100 mg by mouth daily.     atorvastatin (LIPITOR) 20 MG tablet Take 1 tablet (20 mg total) by mouth at bedtime. 90 tablet 3   No current facility-administered medications for this visit.     Past Medical History:  Diagnosis Date   Atrial flutter [facility])    s/p ablation   DVT (deep venous thrombosis) ([facility])    Near syncope 09/25/2013   Paroxysmal atrial fibrillation ([facility])    Stroke [facility])    initially hemorragic (not on [place]) followed by  subsequent embolic stroke    ROS:   All systems reviewed and negative except as noted in the HPI.   Past Surgical History:  Procedure Laterality Date   A-FLUTTER ABLATION N/A 03/09/2020   Procedure: A-FLUTTER ABLATION;  Surgeon: Walter Grayer, MD;  Location: [place] [facility];  Service: Cardiovascular;  Laterality: N/A;   CARDIOVERSION N/A 09/27/2013   Procedure: CARDIOVERSION;  Surgeon: Walter Perla, MD;  Location: [facility] ENDOSCOPY;  Service: Cardiovascular;  Laterality: N/A;   IR ANGIO EXTERNAL CAROTID SEL EXT CAROTID UNI R MOD SED  09/26/2020   IR ANGIO INTRA EXTRACRAN SEL COM CAROTID INNOMINATE BILAT MOD SED  09/26/2020   IR ANGIO VERTEBRAL SEL VERTEBRAL UNI R MOD SED  09/26/2020   IR IVC FILTER PLMT / S&I /IMG GUID/MOD SED  10/08/2020   IR IVUS EACH ADDITIONAL NON CORONARY VESSEL  11/02/2020   IR PTA VENOUS EXCEPT DIALYSIS CIRCUIT  11/02/2020   IR RADIOLOGIST EVAL & MGMT  01/17/2021   IR THROMBECT VENO MECH MOD SED  10/27/2020   IR THROMBECT VENO MECH MOD SED  11/02/2020   IR US GUIDE VASC ACCESS LEFT  10/27/2020   IR US GUIDE VASC ACCESS RIGHT  09/26/2020   IR US GUIDE VASC ACCESS RIGHT  11/02/2020   IR VENO/EXT/UNI RIGHT  11/02/2020   IR VENOCAVAGRAM IVC  10/27/2020  RETINAL DETACHMENT SURGERY     right knee arthroscopy     TEE WITHOUT CARDIOVERSION N/A 09/27/2013   Procedure: TRANSESOPHAGEAL ECHOCARDIOGRAM (TEE);  Surgeon: Walter Bunting, MD;  Location: [facility] ENDOSCOPY;  Service: Cardiovascular;  Laterality: N/A;   WRIST SURGERY       Family History  Problem Relation Age of Onset   Heart disease Mother    Heart disease Father    Healthy Sister    Healthy Brother      Social History   Socioeconomic History   Marital status: Married    Spouse name: Walter [name]   Number of children: Not on file   Years of education: Not on file   Highest education level: Not on file  Occupational History   Not on file  Tobacco Use   Smoking status: Never   Smokeless tobacco: Never  Vaping Use    Vaping Use: Never used  Substance and Sexual Activity   Alcohol use: Yes    Comment: occasionally   Drug use: No   Sexual activity: Not on file  Other Topics Concern   Not on file  Social History Narrative   Lives with wife at home   Right Handed   Drinks decaf   Social Determinants of Health   Financial Resource Strain: Not on file  Food Insecurity: Not on file  Transportation Needs: Not on file  Physical Activity: Not on file  Stress: Not on file  Social Connections: Not on file  Intimate Partner Violence: Not on file     BP 124/64    Pulse 77    Ht 6' (1.829 m)    Wt 225 lb (102.1 kg)    SpO2 95%    BMI 30.52 kg/m   Physical Exam:  Well appearing NAD HEENT: Unremarkable Neck:  No JVD, no thyromegally Lymphatics:  No adenopathy Back:  No CVA tenderness Lungs:  Clear with no wheezes HEART:  Regular rate rhythm, no murmurs, no rubs, no clicks Abd:  soft, positive bowel sounds, no organomegally, no rebound, no guarding Ext:  2 plus pulses, no edema, no cyanosis, no clubbing Skin:  No rashes no nodules Neuro:  CN II through XII intact, motor grossly intact   Assess/Plan:  PAF - he appears to be maintaining NSR. We discussed Watchman procedure. He would be a candidate for this but his DVT makes it a question as to whether he could get by with just a filter. However, his DVT was provoked by extensive bed rest. He is pending referral to a vascular specialist at [place].  ICH - he has had gradual neuro improvement, particularly since his rehab.  DVT - see above. He will continue eliquis for now.   Walter [name]   [facility] Referral for Left Atrial Appendage Closure with Non-Valvular Atrial Fibrillation   Walter [name] is a 65 y.o. male is being referred to the [facility] Team for evaluation for Left Atrial Appendage Closure with Watchman device for the management of stroke risk resulting form non-valvular atrial fibrillation.     Base upon Walter [name] history, he is felt to be a poor candidate for long-term anticoagulation because of a history of bleeding (e.g. intracerebral, subdural, GI, retro-peritoneal).  The patient has a HAS-BLED score of 2  indicating a Yearly Major Bleeding Risk of 12.5 %.      His CHADS2-VASc Score is 2  with an unadjusted Ischemic Stroke Rate (% per year) of  2.2%.  His stroke risk necessitates a strategy of stroke prevention with either long-term oral anticoagulation or left atrial appendage occlusion therapy. We have discussed their bleeding risk in the context of their comorbid medical problems, as well as the rationale for referral for evaluation of Watchman left atrial appendage occlusion therapy. While the patient is at high long-term bleeding risk, they may be appropriate for short-term anticoagulation. Based on this individual patient's stroke and bleeding risk, a shared decision has been made to refer the patient for consideration of Watchman left atrial appendage closure utilizing the [employer] of Cardiology shared decision tool.

## 2021-08-17 ENCOUNTER — Ambulatory Visit (HOSPITAL_BASED_OUTPATIENT_CLINIC_OR_DEPARTMENT_OTHER): Payer: Medicare Other | Admitting: Physical Therapy

## 2021-08-17 NOTE — Telephone Encounter (Signed)
This encounter was created in error - please disregard.

## 2021-08-17 NOTE — Telephone Encounter (Addendum)
Called to arrange appointment with Dr. Quentin Ore.  The patient told me to call [phone] (his wife) next week to arrange.

## 2021-08-20 IMAGING — XA IR THROMBOECTOMY MECHANICAL VENOUS
5 series · 13 of 24 positions shown · IV contrast (IODINE)
Comparison: none

CLINICAL DATA: Recent hemorrhagic stroke. Lower extremity DVT right
worse than left, with IVC filter deployment 10/08/2020. Follow-up CT
demonstrated iliocaval thrombosis.

[Series 1: body 2 care · 2 acquisitions, 3 frames shown (1 of 5)]
[im 1/2]
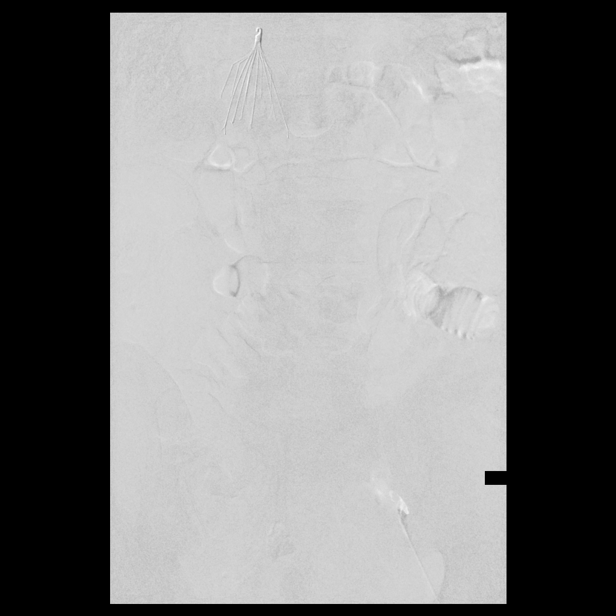
[im 1/2]
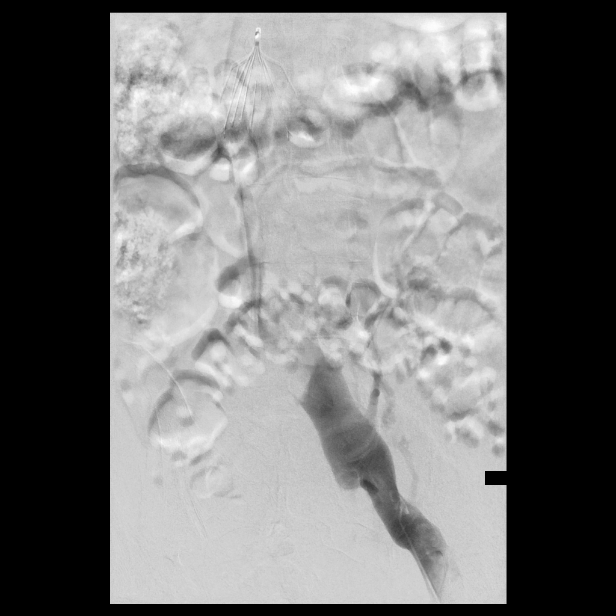
[im 2/2]
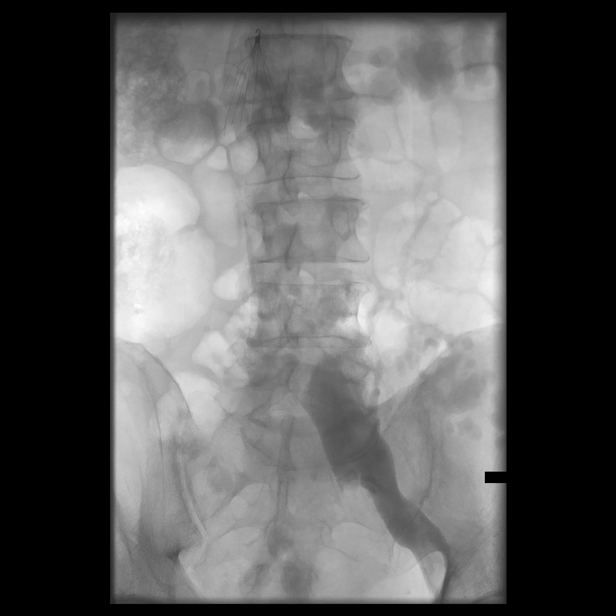

[Series 2: body 2 care · 2 acquisitions, 2 frames shown (2 of 5)]
[im 1/2]
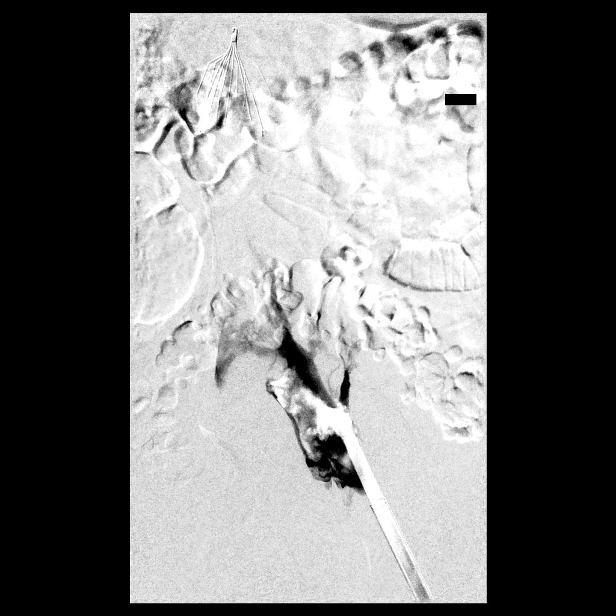
[im 2/2]
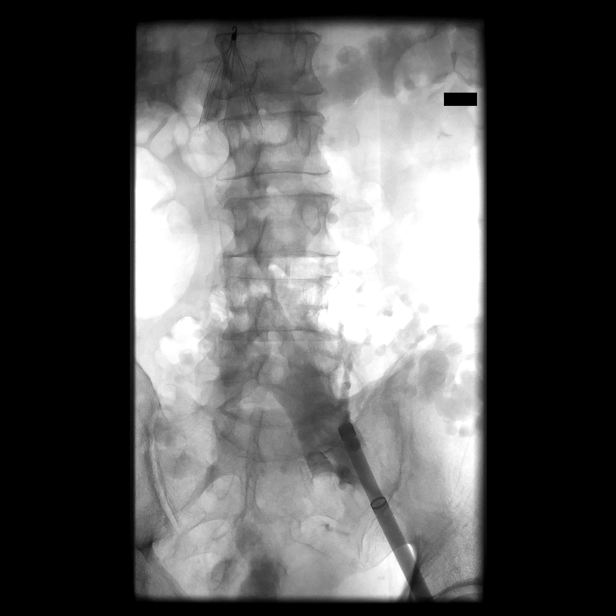

[Series 3: body 2 care · 2 acquisitions, 2 frames shown (3 of 5)]
[im 1/2]
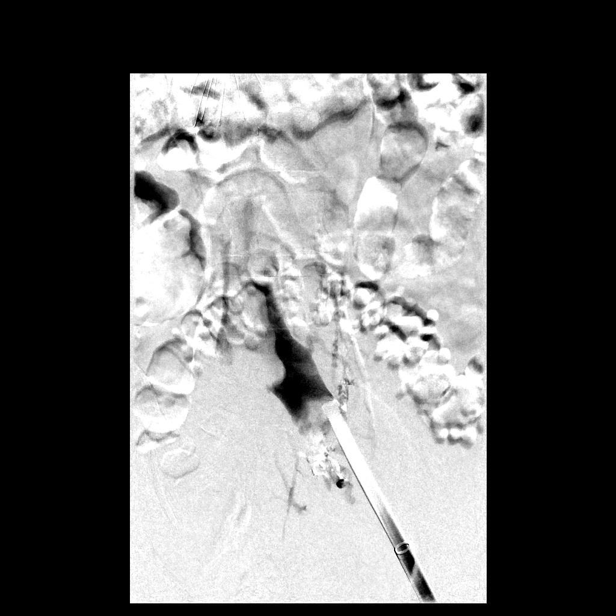
[im 2/2]
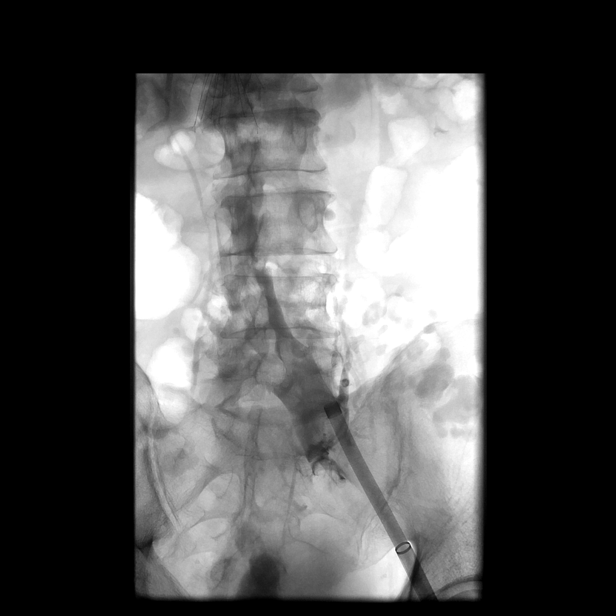

[Series 5: body 2 care · 4 acquisitions, 3 frames shown (4 of 5)]
[im 1/4]
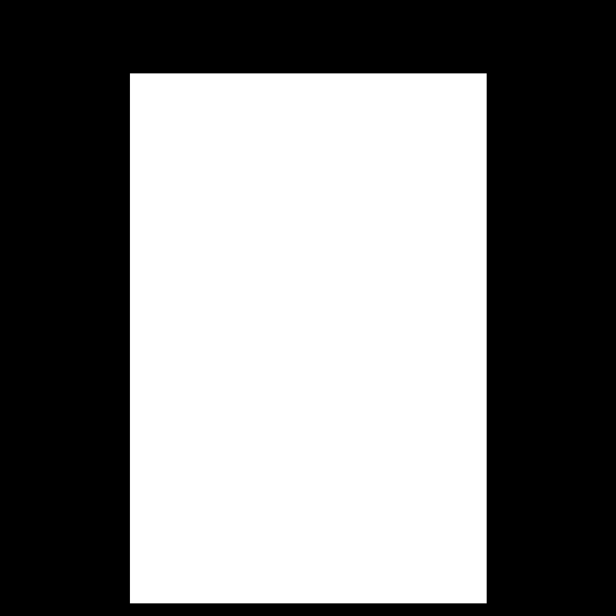
[im 1/4]
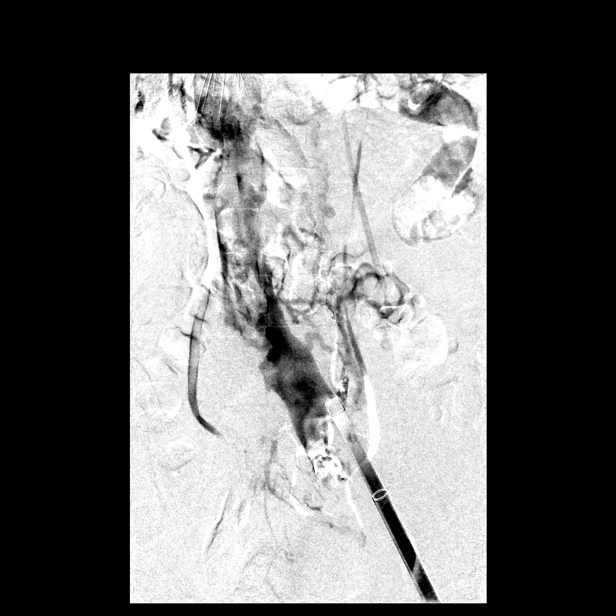
[im 3/4]
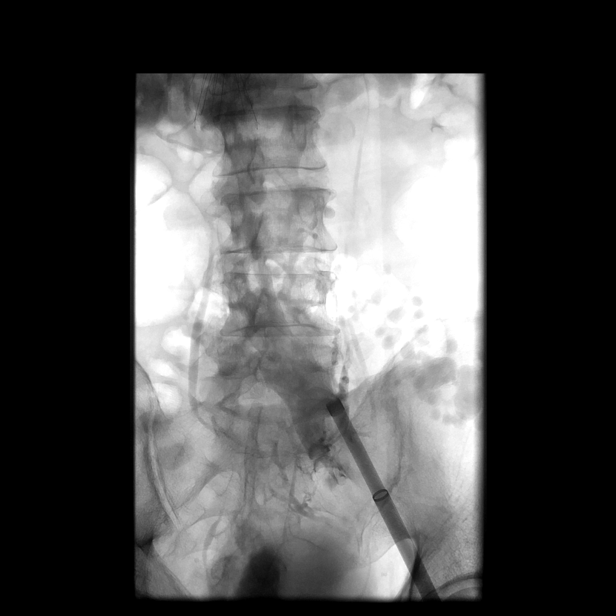

[Series 6: body 2 care · 2 acquisitions, 3 frames shown (5 of 5)]
[im 1/2]
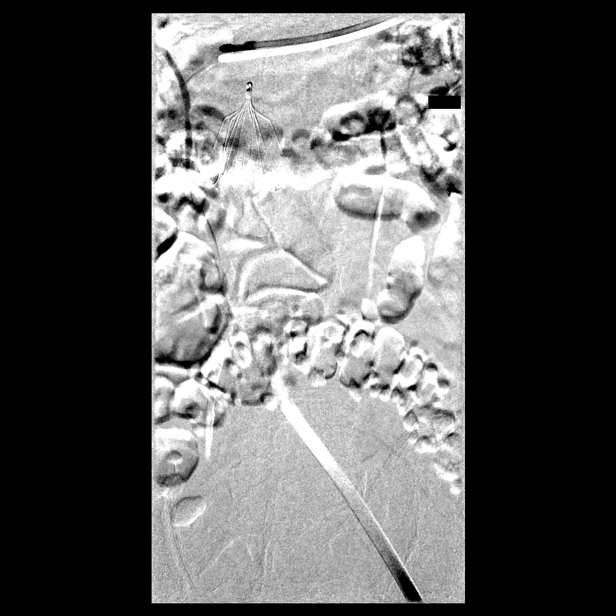
[im 1/2]
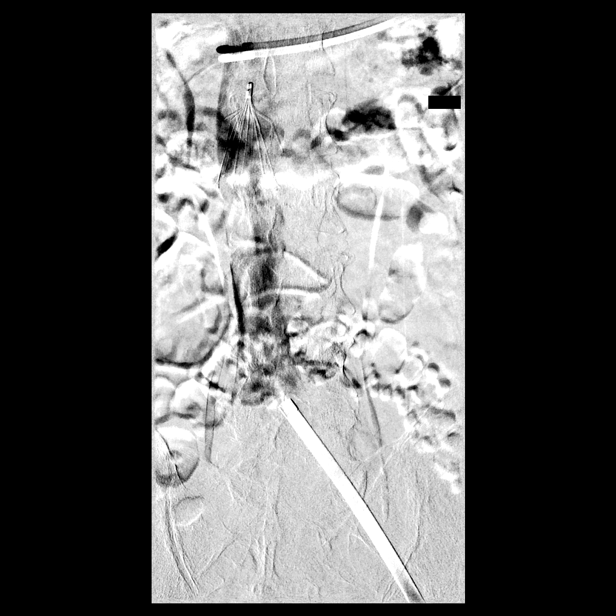
[im 2/2]
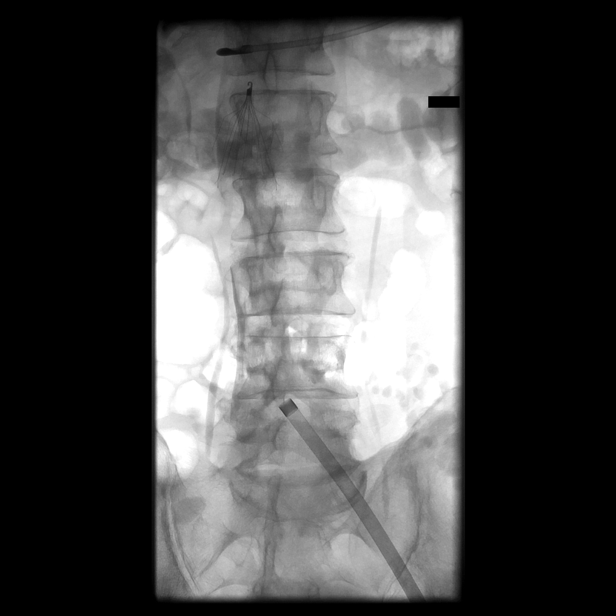

[13 of 24 positions shown; findings below may reference images not displayed]

EXAM:
THROMBOECTOMY MECHANICAL VENOUS; INFERIOR VENA CAVOGRAM; IR
ULTRASOUND GUIDANCE VASC ACCESS LEFT

ANESTHESIA/SEDATION:
Intravenous Fentanyl 266mcg and Versed 2mg were administered as
conscious sedation during continuous monitoring of the patient's
level of consciousness and physiological / cardiorespiratory status
by the radiology RN, with a total moderate sedation time of 105
minutes.

MEDICATIONS:
Lidocaine 1% subcutaneous

CONTRAST:  100mL OMNIPAQUE IOHEXOL 300 MG/ML SOLN, 30mL OMNIPAQUE
IOHEXOL 300 MG/ML SOLN

PROCEDURE:
The procedure, risks (including but not limited to bleeding,
infection, organ damage ), benefits, and alternatives were explained
to the patient and spouse. Questions regarding the procedure were
encouraged and answered. The spouse understands and consents to the
procedure.

Right IJ and bilateral femoral regions prepped and draped in usual
sterile fashion. Maximal barrier sterile technique was utilized
including caps, mask, sterile gowns, sterile gloves, sterile drape,
hand hygiene and skin antiseptic.

The left common iliac vein was localized under ultrasound, patency
documented. Under real-time ultrasound guidance, the vessel was
accessed with a 21-gauge micropuncture needle, exchanged over a 018
guidewire for a transitional dilator. Venography was performed.
Patient was given IV heparin boluses with intermittent ACT checks
targeting 300. The dilator was exchanged over an Amplatz wire for Lerryjulian
Sal 24 French dry seal sheath. Through this, the 22 French
embolectomy sheath was advanced. After confirmatory venography, the
coaxial angled AngioVac F22 cannula was advanced for multiple
embolectomy passes through the infrarenal IVC up to the margin of
the previously placed filter, with intermittent venography and
repositioning. After a total of 535 mL procedure-related blood loss
had occurred, and based on the residual caval thrombus volume, the
procedure was terminated. The venous sheath was removed and
hemostasis achieved at the site with manual compression and a 2-0
Ethilon figure-7 suture.

Dr. Haller assisted intermittently with the procedure.

The patient tolerated the procedure well.

COMPLICATIONS:
None immediate
FINDINGS: Ultrasound demonstrated patency of the left common femoral vein,
without thrombus.

Left iliac venography demonstrated no left iliac venous thrombus.
There is nearly occlusive caval thrombosis from the iliac confluence
through the IVC filter.

After multiple image-directed passes of thrombectomy device, there
is significant debulking of the infrarenal caval thrombus with
restoration of antegrade flow through the IVC and filter. There is a
moderately large residual caval thrombus volume, with some
protruding above the tip of the filter. For this reason, filter
retrieval was not entertained.
IMPRESSION: 1. IVC filter with near-occlusive infrarenal caval thrombosis.
2. Technically successful debulking of caval thrombus allowing
restoration of antegrade flow through the infrarenal IVC and filter.
3. Embolectomy procedure terminated due to concomitant blood loss,
such that a complete caval thrombectomy was not achieved, nor was
right lower extremity thrombectomy attempted, and the IVC filter was
left in place.

## 2021-08-21 NOTE — Telephone Encounter (Signed)
Called the patient's wife, who states she just landed in [place] and will call back this week.

## 2021-08-23 ENCOUNTER — Other Ambulatory Visit: Payer: Medicare Other

## 2021-08-24 ENCOUNTER — Ambulatory Visit (HOSPITAL_BASED_OUTPATIENT_CLINIC_OR_DEPARTMENT_OTHER): Payer: Medicare Other | Admitting: Physical Therapy

## 2021-08-27 ENCOUNTER — Encounter: Payer: Self-pay | Admitting: Physical Medicine & Rehabilitation

## 2021-08-29 ENCOUNTER — Telehealth: Payer: Self-pay

## 2021-08-29 DIAGNOSIS — I629 Nontraumatic intracranial hemorrhage, unspecified: Secondary | ICD-10-CM

## 2021-08-29 NOTE — Telephone Encounter (Signed)
Referral for PT, OT and speech sent to [facility] ?

## 2021-08-31 ENCOUNTER — Ambulatory Visit (HOSPITAL_BASED_OUTPATIENT_CLINIC_OR_DEPARTMENT_OTHER): Payer: Medicare Other | Admitting: Physical Therapy

## 2021-09-03 NOTE — Telephone Encounter (Signed)
Left message to call back  

## 2021-09-05 ENCOUNTER — Encounter: Payer: Self-pay | Admitting: Occupational Therapy

## 2021-09-05 ENCOUNTER — Other Ambulatory Visit: Payer: Self-pay

## 2021-09-05 ENCOUNTER — Ambulatory Visit: Payer: Medicare Other | Attending: Family Medicine | Admitting: Occupational Therapy

## 2021-09-05 ENCOUNTER — Ambulatory Visit: Payer: Medicare Other

## 2021-09-05 DIAGNOSIS — R471 Dysarthria and anarthria: Secondary | ICD-10-CM | POA: Insufficient documentation

## 2021-09-05 DIAGNOSIS — R2681 Unsteadiness on feet: Secondary | ICD-10-CM | POA: Diagnosis present

## 2021-09-05 DIAGNOSIS — R2689 Other abnormalities of gait and mobility: Secondary | ICD-10-CM | POA: Diagnosis present

## 2021-09-05 DIAGNOSIS — R41844 Frontal lobe and executive function deficit: Secondary | ICD-10-CM | POA: Insufficient documentation

## 2021-09-05 DIAGNOSIS — R41842 Visuospatial deficit: Secondary | ICD-10-CM | POA: Insufficient documentation

## 2021-09-05 DIAGNOSIS — R270 Ataxia, unspecified: Secondary | ICD-10-CM | POA: Diagnosis present

## 2021-09-05 DIAGNOSIS — M6281 Muscle weakness (generalized): Secondary | ICD-10-CM

## 2021-09-05 DIAGNOSIS — R41841 Cognitive communication deficit: Secondary | ICD-10-CM

## 2021-09-05 DIAGNOSIS — R278 Other lack of coordination: Secondary | ICD-10-CM | POA: Diagnosis present

## 2021-09-05 NOTE — Therapy (Signed)
OUTPATIENT SPEECH LANGUAGE PATHOLOGY EVALUATION   Patient Name: Walter [name] MRN: [id number] DOB:06-Aug-1955, 66 y.o., male Today's Date: 09/05/2021  PCP: Clovis Riley, L.August Saucer, MD REFERRING PROVIDER: Erick Colace, MD   End of Session - 09/05/21 1527     Visit Number 1    Number of Visits 1    Authorization Type Medicare    SLP Start Time 1450    SLP Stop Time  1525    SLP Time Calculation (min) 35 min    Activity Tolerance Patient tolerated treatment well             Past Medical History:  Diagnosis Date   Atrial flutter (HCC)    s/p ablation   DVT (deep venous thrombosis) (HCC)    Near syncope 09/25/2013   Paroxysmal atrial fibrillation (HCC)    Stroke [facility])    initially hemorragic (not on OAC) followed by subsequent embolic stroke   Past Surgical History:  Procedure Laterality Date   A-FLUTTER ABLATION N/A 03/09/2020   Procedure: A-FLUTTER ABLATION;  Surgeon: Hillis Range, MD;  Location: [facility] INVASIVE CV LAB;  Service: Cardiovascular;  Laterality: N/A;   CARDIOVERSION N/A 09/27/2013   Procedure: CARDIOVERSION;  Surgeon: Lewayne Bunting, MD;  Location: [facility] ENDOSCOPY;  Service: Cardiovascular;  Laterality: N/A;   IR ANGIO EXTERNAL CAROTID SEL EXT CAROTID UNI R MOD SED  09/26/2020   IR ANGIO INTRA EXTRACRAN SEL COM CAROTID INNOMINATE BILAT MOD SED  09/26/2020   IR ANGIO VERTEBRAL SEL VERTEBRAL UNI R MOD SED  09/26/2020   IR IVC FILTER PLMT / S&I /IMG GUID/MOD SED  10/08/2020   IR IVUS EACH ADDITIONAL NON CORONARY VESSEL  11/02/2020   IR PTA VENOUS EXCEPT DIALYSIS CIRCUIT  11/02/2020   IR RADIOLOGIST EVAL & MGMT  01/17/2021   IR THROMBECT VENO MECH MOD SED  10/27/2020   IR THROMBECT VENO MECH MOD SED  11/02/2020   IR US GUIDE VASC ACCESS LEFT  10/27/2020   IR US GUIDE VASC ACCESS RIGHT  09/26/2020   IR US GUIDE VASC ACCESS RIGHT  11/02/2020   IR VENO/EXT/UNI RIGHT  11/02/2020   IR VENOCAVAGRAM IVC  10/27/2020   RETINAL DETACHMENT SURGERY     right knee arthroscopy     TEE WITHOUT  CARDIOVERSION N/A 09/27/2013   Procedure: TRANSESOPHAGEAL ECHOCARDIOGRAM (TEE);  Surgeon: Lewayne Bunting, MD;  Location: [facility] ENDOSCOPY;  Service: Cardiovascular;  Laterality: N/A;   WRIST SURGERY     Patient Active Problem List   Diagnosis Date Noted   Hypertension 08/16/2021   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   Residual cognitive deficit as late effect of stroke 04/19/2021   Hemiparesis affecting right side as late effect of stroke (HCC) 04/19/2021   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 04/19/2021   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 (HCC) 04/19/2021   Thrombosis    Sleep disturbance    Dysphagia, post-stroke    PAF (paroxysmal atrial fibrillation) (HCC)    Acute pulmonary embolism without acute cor pulmonale (HCC)    Malnutrition of moderate degree 11/04/2020   Tracheostomy care (HCC)    Pressure injury of skin 10/23/2020   Intracranial hemorrhage (HCC)    Atrial fibrillation (HCC)    Prediabetes    Leukocytosis    Acute blood loss anemia    Hypernatremia    ICH (intracerebral hemorrhage) (HCC) 09/25/2020   Unilateral primary osteoarthritis, left knee 09/26/2016   Atrial flutter (HCC) 09/25/2013   Near syncope 09/25/2013    ONSET DATE: March  2022   REFERRING DIAG: I62.9 (ICD-10-CM) - Intracranial hemorrhage   THERAPY DIAG:  Cognitive communication deficit  Dysarthria and anarthria  SUBJECTIVE:   SUBJECTIVE STATEMENT: "I went to [place]. We worked with a breathing device" Pt accompanied by: self  PERTINENT HISTORY: Patient is a 66 year old right-handed male history of atrial fibrillation with ablation not on anticoagulation followed by Dr. Johney Frame.  Patient lives with spouse independent prior to admission working as a Veterinary surgeon.  Presented 09/17/2020 with right side weakness headache and dysarthria as well as vomiting.    Cranial CT/MRI showed acute intraparenchymal hemorrhage in dorsal left pons with subarachnoid extension into the  cerebral aqueduct and fourth ventricle.  Patient did require emergent intubation for airway protection.  Hospital course complicated by hydrocephalus with diagnostic cerebral angiogram completed showing no evidence of aneurysm.  EVD was placed per neurosurgery.  Patient was extubated 09/28/2020.   Repeat head CT 09/29/2020 showed new intraventricular hemorrhage in the third and lateral ventricles.  Fourth ventricular and midbrain hemorrhage unchanged and again repeated 10/24/2020 showing improving hemorrhage and edema along the shunt tract in the right frontal lobe.  No new area of hemorrhage. Patient with long-term ventilatory support undergone tracheostomy 10/04/2020 per Dr.Chand critical care services.  On 10/08/2020 patient found to have bilateral lower extremity DVTs and underwent placement of IVC filter placement per interventional radiology.  Dopplers again completed 10/25/2020 showing acute DVT right common femoral vein and SF junction right femoral vein and right proximal profunda vein right popliteal vein and right posterior tibial vein right peroneal vein right soleal vein and right gastrocnemius vein it was established the patient had an IVC filter maintained and Eliquis was also initiated.  CT chest abdomen pelvis did show bilateral large volume right greater than left pulmonary emboli no evidence of right heart strain and it was discussed to continue IVC filter as well as Eliquis.  Interventional radiology consulted to discuss possible removal of IVC filter with CT venogram 10/27/2020 showed thrombus involving the IVC and iliac veins.  Large amount of thrombus involving the infrarenal IVC and IVC filter.  Suprarenal IVC was patent.  Thrombus in the right iliac veins and thrombus in the proximal femoral veins bilaterally and plan was to undergo thrombectomy and retrieval of IVC filter 10/27/2020 per Dr. Deanne Coffer of interventional radiology..  Patient initially n.p.o. with alternative means of nutritional support  advanced to dysphagia #3 thin liquids and also discussion of possible need for PEG tube.  Therapy evaluations were completed due to patient decreased functional mobility was admitted for a comprehensive rehab program 10/28/20.   PAIN:  Are you having pain? No  FALLS: Has patient fallen in last 6 months?  Yes, Number of falls: ~6x  LIVING ENVIRONMENT: Lives with: lives with their family Lives in: House/apartment  PLOF:  Level of assistance: Independent with ADLs Employment: Full-time employment   PATIENT GOALS: N/A  OBJECTIVE:   COGNITION: Overall cognitive status: Within functional limits for tasks assessed; comparable to previous OPST baseline  Attention: WFL Memory: WFL Awareness: WFL Executive function: WFL Behavior: Within functional limits Functional deficits: Pt declined any overt concerns with cognition linguistic deficits.    COGNITIVE COMMUNICATION Auditory comprehension: WFL Verbal expression: WFL Functional communication: WFL  ORAL MOTOR EXAMINATION Facial : Symmetry: WFL Lingual: WFL Velum: WFL Mandible: WFL Voice: Breathy "Shon Baton thinks I have ataxia related to my lungs"  STANDARDIZED ASSESSMENTS: None administered as no cognitive communication concerns reported   PATIENT REPORTED OUTCOME MEASURES (PROM): Cognitive function: Short Form: 40 (40/40)  and Communication SF:  25 (25/25)   TODAY'S TREATMENT:  Reviewed previous SLP recommendations from OPST and [facility] Intensive Rehab. Pt feels comfortable with current HEP and denied any overt concerns with cognition, communication, or swallowing warranting further ST intervention.    PATIENT EDUCATION: Education details: eval results, SLP recommendations (continue EMST/IMST) Person educated: Patient Education method: Explanation Education comprehension: verbalized understanding   ASSESSMENT:  CLINICAL IMPRESSION: Patient is a 66 y.o. male who was seen today for ST re-evaluation secondary to CVA from  March 2022. Pt denied any overt concerns or changes in cognition, speech, or swallowing warranting further ST intervention. Pt presents with consistent baseline established over previous ST course from June to December 2022. No further ST intervention warranted at this time. Pt verbalized agreement.   REHAB POTENTIAL: Good   PLAN: SLP FREQUENCY: one time visit   Gracy Racer, CCC-SLP 09/05/2021, 3:35 PM

## 2021-09-05 NOTE — Therapy (Addendum)
[facility] [address] [place], [place], [address] Phone: [phone]   Fax:  [phone]  Occupational Therapy Evaluation  Patient Details  Name: Walter [name] MRN: [id number] Date of Birth: 06-06-1956 Referring Provider (OT): Dr. Claudette Laws   Encounter Date: 09/05/2021   OT End of Session - 09/05/21 1354     Visit Number 1    Number of Visits 25   25 visits MAX as patient is scheduled for 1x a week unless needing 2 for aquatics therapy   Date for OT Re-Evaluation 11/28/21    Authorization Type Medicare Part A & B    Progress Note Due on Visit 10    OT Start Time 1315    OT Stop Time 1400    OT Time Calculation (min) 45 min    Activity Tolerance Patient tolerated treatment well    Behavior During Therapy [facility] for tasks assessed/performed;Restless             Past Medical History:  Diagnosis Date   Atrial flutter [facility])    s/p ablation   DVT (deep venous thrombosis) (HCC)    Near syncope 09/25/2013   Paroxysmal atrial fibrillation (HCC)    Stroke [facility])    initially hemorragic (not on OAC) followed by subsequent embolic stroke    Past Surgical History:  Procedure Laterality Date   A-FLUTTER ABLATION N/A 03/09/2020   Procedure: A-FLUTTER ABLATION;  Surgeon: Hillis Range, MD;  Location: [facility] INVASIVE CV LAB;  Service: Cardiovascular;  Laterality: N/A;   CARDIOVERSION N/A 09/27/2013   Procedure: CARDIOVERSION;  Surgeon: Lewayne Bunting, MD;  Location: [facility] ENDOSCOPY;  Service: Cardiovascular;  Laterality: N/A;   IR ANGIO EXTERNAL CAROTID SEL EXT CAROTID UNI R MOD SED  09/26/2020   IR ANGIO INTRA EXTRACRAN SEL COM CAROTID INNOMINATE BILAT MOD SED  09/26/2020   IR ANGIO VERTEBRAL SEL VERTEBRAL UNI R MOD SED  09/26/2020   IR IVC FILTER PLMT / S&I /IMG GUID/MOD SED  10/08/2020   IR IVUS EACH ADDITIONAL NON CORONARY VESSEL  11/02/2020   IR PTA VENOUS EXCEPT DIALYSIS CIRCUIT  11/02/2020   IR RADIOLOGIST EVAL & MGMT   01/17/2021   IR THROMBECT VENO MECH MOD SED  10/27/2020   IR THROMBECT VENO MECH MOD SED  11/02/2020   IR US GUIDE VASC ACCESS LEFT  10/27/2020   IR US GUIDE VASC ACCESS RIGHT  09/26/2020   IR US GUIDE VASC ACCESS RIGHT  11/02/2020   IR VENO/EXT/UNI RIGHT  11/02/2020   IR VENOCAVAGRAM IVC  10/27/2020   RETINAL DETACHMENT SURGERY     right knee arthroscopy     TEE WITHOUT CARDIOVERSION N/A 09/27/2013   Procedure: TRANSESOPHAGEAL ECHOCARDIOGRAM (TEE);  Surgeon: Lewayne Bunting, MD;  Location: [facility] ENDOSCOPY;  Service: Cardiovascular;  Laterality: N/A;   WRIST SURGERY      There were no vitals filed for this visit.   Subjective Assessment - 09/05/21 1508     Subjective  Pt denies any changes and or pain since last time here for therapy. Pt is familiar to this clinic and has seen this therapy prior during theapy stint at this clinic. Pt returned from [facility] in [place] where he received intensive therapy for PT and OT. Pt has not seen vision therapy since discharge from OT. Pt reports he is going to follow up with vision therapy and hopes to go to sessions soon for his vision.    Patient is accompanied by: --  no family member present for evaluation   Pertinent History ICH of brainstem (complicated by hydrocephalus, DVTs, and tracheostomy).  PMH:  a-fib    Limitations fall risk, ataxia, impulsivity, diplopia    Patient Stated Goals "I am having to do for myself more in my home. The best way to move around my house"    Currently in Pain? No/denies    Pain Score 0-No pain               [facility] OT Assessment - 09/05/21 0001       Assessment   Medical Diagnosis ICH    Referring Provider (OT) Dr. Alysia Penna    Onset Date/Surgical Date 09/17/20    Hand Dominance Right    Prior Therapy familiar to this clinic      Precautions   Precautions Fall   impulsive, ataxia     Balance Screen   Has the patient fallen in the past 6 months Yes    How many times? "I'm sure I have"      Home   Environment   Family/patient expects to be discharged to: Private residence    Lives With Spouse   24hr caregiver/supervision     Prior Function   Level of Independence Independent    Vocation Full time employment;Part time employment   Cabin crew - currently working "some"   Leisure stand up [employer], scuba dive, basketball (watching and playing), grandchildren      ADL   Eating/Feeding Minimal assistance   occasional assist for cutting food.  difficulty opening packages/containers.   Grooming --   mod I   Upper Body Bathing Modified independent    Lower Body Bathing Modified independent    Upper Body Dressing --   mod I   Lower Body Dressing Modified independent    Toilet Transfer Modified independent    Toileting - Clothing Manipulation --   min A typically for balance   Toileting -  Paediatric nurse   Transfers/Ambulation Related to ADL's CGA      IADL   Prior Level of Function Light Housekeeping independent prior, not performing now    Prior Level of Function Meal Prep not currently performing-- will get ice cream from freezer, spoon and breakfast    Community Mobility Relies on family or friends for transportation      Mobility   Mobility Status Comments ambulates with CGA due to ataxia with RW, cueing for safety/impulsivity      Written Expression   Dominant Hand --   pt reports difficulty with texting on his phone and handwriting per ST evaluation and report.     Vision - History   Baseline Vision Wears glasses all the time    Additional Comments pt arrived with nasal occlusion on right eye today for evaluation      Vision Assessment   Eye Alignment Impaired (comment)    Ocular Range of Motion Within Functional Limits    Tracking/Visual Pursuits Able to track stimulus in all quads without difficulty   with each eye individually (per evaluation  12/26/20)   Convergence Impaired (comment)   incr diplopia with convergence (per 12/26/20 evaluation)   Diplopia Assessment Present in near gaze   and incr on R side (per 12/26/20 evaluation)   Comment Pt has received evaluation with Vision Therapy and Dr. Myrene Galas however has had difficulty with scheduling and appointments -  scheduled to begin therapeutic sessions soon.      Cognition   Overall Cognitive Status Impaired/Different from baseline    Area of Impairment Safety/judgement;Awareness;Memory    Safety/Judgement Decreased awareness of safety    Safety and Judgement Comments impulsive, decr attention LUE, decr proprioception LUE    Awareness Emergent    Problem Solving Impaired   functional   Behaviors Impulsive      Posture/Postural Control   Posture/Postural Control Postural limitations    Posture Comments --   decr trunk stability, ataxia     Sensation   Light Touch Impaired by gross assessment   reports tingling "asleep" in RUE hand. Pt reports hypersensitivity in RUE. Able to feel all light touch on assessment   Hot/Cold Impaired Detail    Hot/Cold Impaired Details Impaired RUE   per report   Proprioception Impaired by gross assessment   LUE     Coordination   Gross Motor Movements are Fluid and Coordinated No   ataxia   Fine Motor Movements are Fluid and Coordinated No   ataxia   9 Hole Peg Test Right;Left    Right 9 Hole Peg Test 36.45s    Left 9 Hole Peg Test 5 pegs in 2 minutes with several drops and knocking pegs out with LUE   05/31/21 LUE 36m53s   Box and Blocks R 39, L 18      Perception   Perception Impaired    Inattention/Neglect --   decr attention to LUE, needed cueing to remove LUE from walker when sitting and sat on arm on left side of chair, hit table on left iwth ROM assessment and did not compensate/realize it     Edema   Edema --      ROM / Strength   AROM / PROM / Strength AROM;Strength      AROM   Overall AROM  Deficits    Overall AROM Comments  BUEs grossly WNL except mild decr R shoulder flex (approx 95%), pt reports old injury prior to CVA (not formally diagnsosed), and approx 90% gross composite flex R hand      Strength   Overall Strength Within functional limits for tasks performed    Overall Strength Comments WFL gross assessment BUE      Hand Function   Right Hand Gross Grasp Functional    Right Hand Grip (lbs) 72.9   64.4 04/27/21   Left Hand Gross Grasp Functional    Left Hand Grip (lbs) 75.8   81.8 04/27/21                               OT Short Term Goals - 09/05/21 1457       OT SHORT TERM GOAL #1   Title Pt will be independent with HEP for LUE coordination    Time 4    Period Weeks    Status New    Target Date 10/03/21      OT SHORT TERM GOAL #2   Title Pt will verbalize understanding of sensory strategies for RUE sensory deficits.    Time 4    Period Weeks    Status New      OT SHORT TERM GOAL #3   Title Pt will improve LUE coordination for ADLs as shown by improving score on box and blocks test by at least 6.    Baseline L 18 R 39    Time 4  Period Weeks    Status New               OT Long Term Goals - 09/05/21 1459       OT LONG TERM GOAL #1   Title Pt will be independent with Aquatics HEP PRN    Time 12    Period Weeks    Status New    Target Date 11/28/21      OT LONG TERM GOAL #2   Title Pt will improve LUE hand coordination for ADLs as shown by completing 9-hole peg test in less than 90 seconds    Baseline 5 pegs in 2 minutes with several drops and knocking pegs out    Time 12    Period Weeks    Status New      OT LONG TERM GOAL #3   Title Pt will verbalize understanding of visual compensation strategies and functional scanning strategies for increasing safety with ADLs and IADLs.    Time 12    Period Weeks    Status New      OT LONG TERM GOAL #4   Title Pt will perform environmental scanning with 90% accuracy for increased safety.      OT LONG  TERM GOAL #5   Title Pt will demo good safety awareness/compensation for sensory deficits and attention to LUE during functional tasks and transfers without cueing.    Time 12    Period Weeks    Status New      OT LONG TERM GOAL #6   Title Pt will report increased ease and accuracy with handwriting and texting with visual strategies and compensations as needed.    Time 12    Period Weeks    Status New                   Plan - 09/05/21 1355     Clinical Impression Statement Patient is a 66 y.o. male who was seen today for occupational therapy evaluation and treatment for brainstem bleed involving left midbrain, pons as well as left thalamus. Pt is familiar to this clinic and did not return to occupational therapy post 06/05/21 visit and went to [place] to [facility] for rehab. Per most recent MD note, discussion was made with patient regarding plateau in functional status at this time. 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  Skilld occupational therapy is recommended to target listed areas of deficit and increase independence with ADLs and IADLs.    OT Occupational Profile and History Detailed Assessment- Review of Records and additional review of physical, cognitive, psychosocial history related to current functional performance    Occupational performance deficits (Please refer to evaluation for details): ADL's;IADL's;Work;Leisure;Social Participation    Body Structure / Function / Physical Skills ADL;Strength;Balance;Proprioception;UE functional use;IADL;Endurance;Vision;Mobility;Coordination;Decreased knowledge of precautions;FMC;GMC;Sensation    Cognitive Skills Attention;Safety Awareness;Memory;Perception;Problem Solve    Rehab Potential Fair   d/t duration from initial event   Clinical Decision Making Several treatment options, min-mod task modification necessary    Comorbidities  Affecting Occupational Performance: May have comorbidities impacting occupational performance    Modification or Assistance to Complete Evaluation  Min-Moderate modification of tasks or assist with assess necessary to complete eval    OT Frequency Other (comment)   1-2 visits based on need for aquatics   OT Duration 12 weeks   may d/c early based on progress or change  frequency based on aquatics and need for more visits 1-2 visits over 12 weeks.   OT Treatment/Interventions Self-care/ADL training;Moist Heat;Fluidtherapy;DME and/or AE instruction;Balance training;Therapeutic activities;Aquatic Therapy;Ultrasound;Therapeutic exercise;Cognitive remediation/compensation;Visual/perceptual remediation/compensation;Functional Mobility Training;Neuromuscular education;Cryotherapy;Energy conservation;Manual Therapy;Patient/family education    Plan review coordination activities, strategies for LUE ataxia, aquatics?    Consulted and Agree with Plan of Care Patient             Patient will benefit from skilled therapeutic intervention in order to improve the following deficits and impairments:   Body Structure / Function / Physical Skills: ADL, Strength, Balance, Proprioception, UE functional use, IADL, Endurance, Vision, Mobility, Coordination, Decreased knowledge of precautions, FMC, GMC, Sensation Cognitive Skills: Attention, Safety Awareness, Memory, Perception, Problem Solve     Visit Diagnosis: Ataxia - Plan: Ot plan of care cert/re-cert  Unsteadiness on feet - Plan: Ot plan of care cert/re-cert  Other abnormalities of gait and mobility - Plan: Ot plan of care cert/re-cert  Muscle weakness (generalized) - Plan: Ot plan of care cert/re-cert  Other lack of coordination - Plan: Ot plan of care cert/re-cert  Visuospatial deficit - Plan: Ot plan of care cert/re-cert  Frontal lobe and executive function deficit - Plan: Ot plan of care cert/re-cert    Problem List Patient Active Problem  List   Diagnosis Date Noted   Hypertension 08/16/2021   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   Residual cognitive deficit as late effect of stroke 04/19/2021   Hemiparesis affecting right side as late effect of stroke ([place]) 04/19/2021   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 04/19/2021   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 ([place]) 04/19/2021   Thrombosis    Sleep disturbance    Dysphagia, post-stroke    PAF (paroxysmal atrial fibrillation) ([place])    Acute pulmonary embolism without acute cor pulmonale (HCC)    Malnutrition of moderate degree 11/04/2020   Tracheostomy care ([place])    Pressure injury of skin 10/23/2020   Intracranial hemorrhage (HCC)    Atrial fibrillation (HCC)    Prediabetes    Leukocytosis    Acute blood loss anemia    Hypernatremia    ICH (intracerebral hemorrhage) ([place]) 09/25/2020   Unilateral primary osteoarthritis, left knee 09/26/2016   Atrial flutter ([place]) 09/25/2013   Near syncope 09/25/2013    Zachery Conch, OT 09/05/2021, 3:29 PM  [facility] [address] [place], [place], [address] Phone: [phone]   Fax:  [phone]  Name: Walter [name] MRN: [id number] Date of Birth: [birth year]

## 2021-09-05 NOTE — Therapy (Signed)
OUTPATIENT PHYSICAL THERAPY NEURO EVALUATION   Patient Name: Walter [name] MRN: [id number] DOB:Feb 01, 1956, 66 y.o., male Today's Date: 09/06/2021  PCP: Clovis Riley, L.August Saucer, MD REFERRING PROVIDER: Erick Colace, MD   PT End of Session - 09/05/21 1405     Visit Number 1    Number of Visits 9    Date for PT Re-Evaluation 11/02/21    Authorization Type Medicare so 10th visit progress note    PT Start Time 1402    PT Stop Time 1449    PT Time Calculation (min) 47 min    Equipment Utilized During Treatment Gait belt    Activity Tolerance Patient tolerated treatment well    Behavior During Therapy Impulsive;WFL for tasks assessed/performed             Past Medical History:  Diagnosis Date   Atrial flutter (HCC)    s/p ablation   DVT (deep venous thrombosis) (HCC)    Near syncope 09/25/2013   Paroxysmal atrial fibrillation (HCC)    Stroke [facility])    initially hemorragic (not on OAC) followed by subsequent embolic stroke   Past Surgical History:  Procedure Laterality Date   A-FLUTTER ABLATION N/A 03/09/2020   Procedure: A-FLUTTER ABLATION;  Surgeon: Hillis Range, MD;  Location: [facility] INVASIVE CV LAB;  Service: Cardiovascular;  Laterality: N/A;   CARDIOVERSION N/A 09/27/2013   Procedure: CARDIOVERSION;  Surgeon: Lewayne Bunting, MD;  Location: [facility] ENDOSCOPY;  Service: Cardiovascular;  Laterality: N/A;   IR ANGIO EXTERNAL CAROTID SEL EXT CAROTID UNI R MOD SED  09/26/2020   IR ANGIO INTRA EXTRACRAN SEL COM CAROTID INNOMINATE BILAT MOD SED  09/26/2020   IR ANGIO VERTEBRAL SEL VERTEBRAL UNI R MOD SED  09/26/2020   IR IVC FILTER PLMT / S&I /IMG GUID/MOD SED  10/08/2020   IR IVUS EACH ADDITIONAL NON CORONARY VESSEL  11/02/2020   IR PTA VENOUS EXCEPT DIALYSIS CIRCUIT  11/02/2020   IR RADIOLOGIST EVAL & MGMT  01/17/2021   IR THROMBECT VENO MECH MOD SED  10/27/2020   IR THROMBECT VENO MECH MOD SED  11/02/2020   IR US GUIDE VASC ACCESS LEFT  10/27/2020   IR US GUIDE VASC ACCESS RIGHT  09/26/2020    IR US GUIDE VASC ACCESS RIGHT  11/02/2020   IR VENO/EXT/UNI RIGHT  11/02/2020   IR VENOCAVAGRAM IVC  10/27/2020   RETINAL DETACHMENT SURGERY     right knee arthroscopy     TEE WITHOUT CARDIOVERSION N/A 09/27/2013   Procedure: TRANSESOPHAGEAL ECHOCARDIOGRAM (TEE);  Surgeon: Lewayne Bunting, MD;  Location: [facility] ENDOSCOPY;  Service: Cardiovascular;  Laterality: N/A;   WRIST SURGERY     Patient Active Problem List   Diagnosis Date Noted   Hypertension 08/16/2021   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   Residual cognitive deficit as late effect of stroke 04/19/2021   Hemiparesis affecting right side as late effect of stroke (HCC) 04/19/2021   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 04/19/2021   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 (HCC) 04/19/2021   Thrombosis    Sleep disturbance    Dysphagia, post-stroke    PAF (paroxysmal atrial fibrillation) (HCC)    Acute pulmonary embolism without acute cor pulmonale (HCC)    Malnutrition of moderate degree 11/04/2020   Tracheostomy care (HCC)    Pressure injury of skin 10/23/2020   Intracranial hemorrhage (HCC)    Atrial fibrillation (HCC)    Prediabetes    Leukocytosis    Acute blood loss anemia    Hypernatremia  ICH (intracerebral hemorrhage) (HCC) 09/25/2020   Unilateral primary osteoarthritis, left knee 09/26/2016   Atrial flutter (HCC) 09/25/2013   Near syncope 09/25/2013    ONSET DATE: 08/29/2021 ( CVA was 09/25/20)  REFERRING DIAG: I62.9 (ICD-10-CM) - Intracranial hemorrhage   THERAPY DIAG:  Other abnormalities of gait and mobility  Muscle weakness (generalized)  Unsteadiness on feet  SUBJECTIVE:                                                                                                                                                                                              SUBJECTIVE STATEMENT: Pt returns to therapy after a stay a [facility] in [place] for 2 weeks inpatient and 2 weeks  outpatient. History of brainstem bleed involving the left midbrain, pons as well as left thalamus. Pt reports that he feels that he is now more realistic in his goals and wants to get a good work out plan. Pt reports that they are in process of building a home that will have a 9" difference to enter home. 2 separate steps with 5" and 4" step. Plan to move in August. Pt currently walks with walker but wishes he could take a few steps without it in home. Pt reports that he is able to shower on own but has his wife close by.  Pt accompanied by:  wife dropped him off  PERTINENT HISTORY: atrial flutter s/p ablation, DVT with IVC filter, near syncope, CVA- Brainstem bleed involving the left midbrain, pons as well as left thalamus.  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  PAIN:  Are you having pain? Yes NPRS scale: 1/10,  Pain location: hand but some in leg Pain orientation: Right  PAIN TYPE: chronic, neuropathic Pain description: constant and like hand has gone to sleep   Aggravating factors: temperature sensitive Relieving factors: just started medicine  PRECAUTIONS: Fall  WEIGHT BEARING RESTRICTIONS No  FALLS: Has patient fallen in last 6 months? Yes, Number of falls: 3   LIVING ENVIRONMENT: Lives with: lives with their spouse, has caregiver that comes in if wife is gone Lives in: House/apartment Stairs: Yes; Internal: a flight but does not go up there steps; can reach both and External: 8 steps; bilateral but cannot reach both Has following equipment at home: Dan Humphreys - 2 wheeled, Wheelchair (manual), shower chair, and Grab bars  PLOF: Independent  PATIENT GOALS Pt wants to figure out a good work out plan to do at home.  OBJECTIVE:    Vitals: BP=130/82, HR=96 COGNITION: Overall cognitive status: Impaired/different from baseline.  Pt reports that things are not as clear at times. Impulsive  SENSATION: Light  touch: Appears intact Hot/Cold: Appears intact, sharp/dull impaired at right thumb Proprioception: Deficits impaired thumb find on right but seems to be more coordination related.  Proprioception was intact at distal thumb. Vision: pt has double vision with tape applied to right medial lens. Was keeping right eye closed stating he needed more tape for left lens. PT cut tape a applied to medial left lens of glasses and pt was able to keep both eyes open with improvement in double vision. Has been to neuro eye doctor and trying to return for vision rehab. COORDINATION: RAMs impaired on left, finger to nose impaired on left, heel/shin impaired on left  EDEMA:  None noted at eval   POSTURE: rounded shoulders  AROM/PROM:    A/PROM Right 09/06/2021 Left 09/06/2021  Hip flexion    Hip extension    Hip abduction    Hip adduction    Hip internal rotation    Hip external rotation    Knee flexion    Knee extension    Ankle dorsiflexion    Ankle plantarflexion    Ankle inversion    Ankle eversion     (Blank rows = not tested) MMT:  MMT Right 09/06/2021 Left 09/06/2021  Hip flexion 5/5 5/5  Hip extension    Hip abduction 5/5 5/5  Hip adduction 5/5 5/5  Hip internal rotation    Hip external rotation    Knee flexion 5/5 5/5  Knee extension 5/5 5/5  Ankle dorsiflexion 5/5 5/5  Ankle plantarflexion    Ankle inversion    Ankle eversion    (Blank rows = not tested)    TRANSFERS: Assistive device utilized: Environmental consultant - 2 wheeled  Sit to stand: CGA, with UE support Stand to sit: CGA   GAIT: Gait pattern: step through pattern, decreased step length- Right, decreased step length- Left, ataxic, and trunk flexed Distance walked: 50' Assistive device utilized: Environmental consultant - 2 wheeled and gait belt Level of assistance: CGA and Min A Comments: Needs cues to stay upright in walker and slow down.  FUNCTIONAL TESTs:  5 times sit to stand: 13.85 sec from chair with occasional hand use 10 meter walk  test: 17.5 sec with RW=0.51m/s TUG=17.39 sec with RW  TODAY'S TREATMENT:     PATIENT EDUCATION: Education details: PT poc Person educated: Patient Education method: Explanation Education comprehension: verbalized understanding   HOME EXERCISE PROGRAM:   ASSESSMENT:  CLINICAL IMPRESSION: Patient is a 66 y.o. male who was seen today for physical therapy evaluation and treatment for history of intracranial hemorrhage in March of 2022. Pt presents with sensory deficits in RUE, visual deficits, impaired coordination on left with ataxic movements. He is CGA/min assist for safety with transfers and gait with RW. Gait speed  of 0.38m/s indicates household ambulator but unsafe for community mobility. Pt is high fall risk based on 5 x sit to stand of 13.85 sec and TUG of 17.39 sec but also demonstrates impulsiveness with need to slow down to be more controlled in his movements. Pt reports double vision and will be pursuing vision rehab. Currently wearing tape on medial lenses to help. Pt will benefit from skilled PT to address sensory/coordination deficits, balance and improve functional mobility with focus of establishing a good HEP that he can perform at home.   OBJECTIVE IMPAIRMENTS Abnormal gait, decreased activity tolerance, decreased balance, decreased cognition, decreased coordination, decreased endurance, decreased knowledge of use of DME,  decreased mobility, decreased strength, impaired sensation, impaired UE functional use, and impaired vision/preception.   ACTIVITY LIMITATIONS cleaning, community activity, and yard work.   PERSONAL FACTORS Time since onset of injury/illness/exacerbation and 1-2 comorbidities: atrial flutter s/p ablation, DVT with IVC filter,  are also affecting patient's functional outcome.    REHAB POTENTIAL: Fair    CLINICAL DECISION MAKING: Evolving/moderate complexity  EVALUATION COMPLEXITY: Moderate   GOALS: Goals reviewed with patient? Yes  SHORT TERM  GOALS:  Pt will be able to perform initial HEP for core stabilization, balance to continue gains on own with supervision of family.  Baseline:  Target date: 10/04/2021 Goal status: INITIAL  2.  Pt will be able to ambulate up/down curb with FWW supervision to be able to safely enter new home with 2 steps to enter. Baseline:  Target date: 10/04/2021 Goal status: INITIAL  3.  Sharlene Motts will be performed and LTG updated Baseline: TBD Target date: 10/04/2021 Goal status: INITIAL    LONG TERM GOALS:  Pt will be able to perform progressive HEP for balance, core strengthening and aerobic activity to continue gains on own with assistance of family/caregiver. (LTGs due 11/02/21)  Baseline:  Target date:  11/02/21 Goal status: INITIAL  2.  Pt will increase gait speed from 0.27m/s to >0.57m/s for improved community mobility. Baseline: 0.16m/s on 09/05/21 Target date:  11/02/21 Goal status: INITIAL  3.  Berg goal TBD. Baseline: TBD Target date:  11/02/21 Goal status: INITIAL  4.  Pt will ambulate >400' with RW supervision for improved short community distances and activity tolerance. Baseline:  Target date:  11/02/21 Goal status: INITIAL  5.  Pt will decrease 5 x sit to stand from 13.85 sec to <12 sec for improved balance and functional mobility. Baseline: 09/05/21 13.85 sec from chair with occasional hand support Target date:  11/02/21 Goal status: INITIAL    PLAN: PT FREQUENCY: 1x/week  PT DURATION: 8 weeks, plus eval  PLANNED INTERVENTIONS: Therapeutic exercises, Therapeutic activity, Neuromuscular re-education, Balance training, Gait training, Patient/Family education, Joint mobilization, Stair training, and Vestibular training  PLAN FOR NEXT SESSION: Perform Berg and update LTG. Begin core stabilization working on slow, controlled movements, gait and balance training.   Ronn Melena, PT, DPT, NCS 09/06/2021, 8:01 AM

## 2021-09-05 NOTE — Addendum Note (Signed)
Addended by: Zachery Conch on: 09/05/2021 03:31 PM ? ? Modules accepted: Orders ? ?

## 2021-09-06 ENCOUNTER — Ambulatory Visit (HOSPITAL_COMMUNITY)
Admission: RE | Admit: 2021-09-06 | Discharge: 2021-09-06 | Disposition: A | Discharge status: Home / Self Care | Payer: Medicare Other | Source: Ambulatory Visit | Attending: Student | Admitting: Student

## 2021-09-06 DIAGNOSIS — R0600 Dyspnea, unspecified: Secondary | ICD-10-CM

## 2021-09-06 LAB — BLOOD GAS, ARTERIAL
Acid-Base Excess: 4.2 mmol/L — ABNORMAL HIGH (ref 0.0–2.0)
Bicarbonate: 29.2 mmol/L — ABNORMAL HIGH (ref 20.0–28.0)
Drawn by: 21179
O2 Saturation: 96.3 %
Patient temperature: 37
pCO2 arterial: 44 mmHg (ref 32–48)
pH, Arterial: 7.43 (ref 7.35–7.45)
pO2, Arterial: 73 mmHg — ABNORMAL LOW (ref 83–108)

## 2021-09-06 NOTE — Progress Notes (Signed)
Patient in today for RA ABG done and results in computer. ?

## 2021-09-07 ENCOUNTER — Ambulatory Visit (HOSPITAL_BASED_OUTPATIENT_CLINIC_OR_DEPARTMENT_OTHER): Payer: Medicare Other | Admitting: Physical Therapy

## 2021-09-11 NOTE — Telephone Encounter (Signed)
Scheduled the patient 10/05/2021 for Watchman evaluation with Dr. Lalla Brothers. ?His wife was grateful for call and agrees with plan.  ?

## 2021-09-12 ENCOUNTER — Ambulatory Visit
Admission: RE | Admit: 2021-09-12 | Discharge: 2021-09-12 | Disposition: A | Discharge status: Home / Self Care | Payer: Medicare Other | Source: Ambulatory Visit | Attending: Interventional Radiology | Admitting: Interventional Radiology

## 2021-09-12 ENCOUNTER — Encounter: Payer: Self-pay | Admitting: *Deleted

## 2021-09-12 DIAGNOSIS — I8290 Acute embolism and thrombosis of unspecified vein: Secondary | ICD-10-CM

## 2021-09-12 NOTE — Progress Notes (Signed)
? ?Reason for follow up: ?Status post iliocaval thrombectomy on 11/02/20 ? ?History of present illness: ?66 year old male with history of recent hemorrhagic stroke, bilateral lower extremity DVT and low-risk PE, status post IVC filter placement on 10/08/2020 with subsequent propagation of right greater than left lower extremity deep vein thrombosis with ileocaval involvement and near occlusion of the inferior vena cava. Status post suction thrombectomy on 10/27/2020 with inadequate clearance of iliocaval thrombus followed by repeat mechanical thrombectomy on 11/02/2020.  ? ?Last visit was on 01/17/21, at which time he had some residual bilateral lower extremity DVT and mild right lower extremity swelling.  He had just begun to ambulate with assistance and was undergoing rehab.  In the interim, he has been to a rehabilitation center in [place], [place] associated with the [facility] and made significant strides.  He denies any lower extremity swelling, redness, abdominal pain, shortness of breath.  He has significant left sided atrophy.  He has been compliant with Eliquis.  Recently evaluated by Dr. Cristopher Peru for [facility] procedure given history of atrial flutter.   ?  ? ?Past Medical History:  ?Diagnosis Date  ? Atrial flutter ([place])   ? s/p ablation  ? DVT (deep venous thrombosis) ([place])   ? Near syncope 09/25/2013  ? Paroxysmal atrial fibrillation (HCC)   ? Stroke [facility])   ? initially hemorragic (not on [place]) followed by subsequent embolic stroke  ? ? ?Past Surgical History:  ?Procedure Laterality Date  ? A-FLUTTER ABLATION N/A 03/09/2020  ? Procedure: A-FLUTTER ABLATION;  Surgeon: Thompson Grayer, MD;  Location: [place] CV LAB;  Service: Cardiovascular;  Laterality: N/A;  ? CARDIOVERSION N/A 09/27/2013  ? Procedure: CARDIOVERSION;  Surgeon: Lelon Perla, MD;  Location: [facility] ENDOSCOPY;  Service: Cardiovascular;  Laterality: N/A;  ? IR ANGIO EXTERNAL CAROTID SEL EXT CAROTID UNI R MOD SED  09/26/2020  ? IR ANGIO INTRA  EXTRACRAN SEL COM CAROTID INNOMINATE BILAT MOD SED  09/26/2020  ? IR ANGIO VERTEBRAL SEL VERTEBRAL UNI R MOD SED  09/26/2020  ? IR IVC FILTER PLMT / S&I /IMG GUID/MOD SED  10/08/2020  ? IR IVUS EACH ADDITIONAL NON CORONARY VESSEL  11/02/2020  ? IR PTA VENOUS EXCEPT DIALYSIS CIRCUIT  11/02/2020  ? IR RADIOLOGIST EVAL & MGMT  01/17/2021  ? IR THROMBECT VENO MECH MOD SED  10/27/2020  ? IR THROMBECT VENO MECH MOD SED  11/02/2020  ? IR US GUIDE VASC ACCESS LEFT  10/27/2020  ? IR US GUIDE VASC ACCESS RIGHT  09/26/2020  ? IR US GUIDE VASC ACCESS RIGHT  11/02/2020  ? IR VENO/EXT/UNI RIGHT  11/02/2020  ? IR VENOCAVAGRAM IVC  10/27/2020  ? RETINAL DETACHMENT SURGERY    ? right knee arthroscopy    ? TEE WITHOUT CARDIOVERSION N/A 09/27/2013  ? Procedure: TRANSESOPHAGEAL ECHOCARDIOGRAM (TEE);  Surgeon: Lelon Perla, MD;  Location: [place];  Service: Cardiovascular;  Laterality: N/A;  ? WRIST SURGERY    ? ? ?Allergies: ?Keppra [levetiracetam], Latex, and Topiramate ? ?Medications: ?Prior to Admission medications   ?Medication Sig Start Date End Date Taking? Authorizing Provider  ?apixaban (ELIQUIS) 5 MG TABS tablet TAKE 1 TABLET BY MOUTH TWICE A DAY 06/07/21   Allred, Jeneen Rinks, MD  ?atorvastatin (LIPITOR) 20 MG tablet Take 1 tablet (20 mg total) by mouth at bedtime. 08/16/21   Evans Lance, MD  ?diltiazem (CARDIZEM CD) 120 MG 24 hr capsule Take 1 capsule (120 mg total) by mouth daily. 08/16/21 11/14/21  Evans Lance, MD  ?  EPINEPHrine 0.3 mg/0.3 mL IJ SOAJ injection     [provider]  ?gabapentin (NEURONTIN) 100 MG capsule Take 1 capsule (100 mg total) by mouth 3 (three) times daily. 08/07/21   Garvin Fila, MD  ?mirtazapine (REMERON) 7.5 MG tablet TAKE 2 TABLETS BY MOUTH AT BEDTIME 06/05/21   Garvin Fila, MD  ?sertraline (ZOLOFT) 100 MG tablet Take 100 mg by mouth daily. 01/31/21   [provider]  ?  ? ?Family History  ?Problem Relation Age of Onset  ? Heart disease Mother   ? Heart disease Father   ? Healthy Sister    ? Healthy Brother   ? ? ?Social History  ? ?Socioeconomic History  ? Marital status: Married  ?  Spouse name: cathy  ? Number of children: Not on file  ? Years of education: Not on file  ? Highest education level: Not on file  ?Occupational History  ? Not on file  ?Tobacco Use  ? Smoking status: Never  ? Smokeless tobacco: Never  ?Vaping Use  ? Vaping Use: Never used  ?Substance and Sexual Activity  ? Alcohol use: Yes  ?  Comment: occasionally  ? Drug use: No  ? Sexual activity: Not on file  ?Other Topics Concern  ? Not on file  ?Social History Narrative  ? Lives with wife at home  ? Right Handed  ? Drinks decaf  ? ?Social Determinants of Health  ? ?Financial Resource Strain: Not on file  ?Food Insecurity: Not on file  ?Transportation Needs: Not on file  ?Physical Activity: Not on file  ?Stress: Not on file  ?Social Connections: Not on file  ? ? ? ?Vital Signs: ?There were no vitals taken for this visit. ? ?Physical Exam ?Constitutional:   ?   General: He is not in acute distress. ?HENT:  ?   Head: Normocephalic.  ?   Mouth/Throat:  ?   Mouth: Mucous membranes are moist.  ?Eyes:  ?   General: No scleral icterus. ?Cardiovascular:  ?   Rate and Rhythm: Normal rate and regular rhythm.  ?Pulmonary:  ?   Breath sounds: Normal breath sounds.  ?Abdominal:  ?   General: There is no distension.  ?Musculoskeletal:  ?   Right lower leg: No edema.  ?   Left lower leg: No edema.  ?Skin: ?   General: Skin is warm and dry.  ?Neurological:  ?   Mental Status: He is alert and oriented to person, place, and time.  ? ? ?Imaging: ?DVT US (09/12/21): ?IMPRESSION: ?Complete resolution of previously visualized residual bilateral ?lower extremity deep vein thrombosis.  ? ?CT AP (01/15/21): ?IMPRESSION: ?VASCULAR ?Resolution of previously visualized iliocaval thrombus. No ?evidence of residual thrombus within the indwelling infrarenal IVC ?filter.  ? ? ? ?Labs: ? ?CBC: ?Recent Labs  ?  10/27/20 ?0144 10/28/20 ?0226 10/29/20 ?0706  11/20/20 ?0621 08/15/21 ?1158  ?WBC 7.2  --  6.1 4.4 5.5  ?HGB 9.2* 8.3* 9.0* 12.0* 15.1  ?HCT 29.2* 25.6* 28.3* 37.2* 45.6  ?PLT 199  --  227 290 237.0  ? ? ?COAGS: ?Recent Labs  ?  09/25/20 ?0309 10/25/20 ?1041  ?INR 1.0 1.1  ?APTT 27 34  ? ? ?BMP: ?Recent Labs  ?  10/03/20 ?0613 10/03/20 ?0854 10/29/20 ?0706 11/06/20 ?0510 11/13/20 ?[id number] 11/20/20 ?[id number] 01/15/21 ?0957 08/15/21 ?1158  ?NA QUESTIONABLE RESULTS, RECOMMEND RECOLLECT TO VERIFY   < > 133* 134* 136 136  --  138  ?K QUESTIONABLE RESULTS, RECOMMEND RECOLLECT  TO VERIFY   < > 4.0 4.0 3.9 3.8  --  4.3  ?CL QUESTIONABLE RESULTS, RECOMMEND RECOLLECT TO VERIFY   < > 100 101 101 102  --  102  ?CO2 QUESTIONABLE RESULTS, RECOMMEND RECOLLECT TO VERIFY   < > 25 26 28 26   --  27  ?GLUCOSE QUESTIONABLE RESULTS, RECOMMEND RECOLLECT TO VERIFY   < > 122* 123* 113* 116*  --  81  ?BUN QUESTIONABLE RESULTS, RECOMMEND RECOLLECT TO VERIFY   < > 7* 7* 5* 6*  --  16  ?CALCIUM QUESTIONABLE RESULTS, RECOMMEND RECOLLECT TO VERIFY   < > 8.6* 9.0 9.4 9.2  --  9.8  ?CREATININE QUESTIONABLE RESULTS, RECOMMEND RECOLLECT TO VERIFY   < > 0.68 0.70 0.81 0.80 0.70 0.94  ?GFRNONAA QUESTIONABLE RESULTS, RECOMMEND RECOLLECT TO VERIFY   < > >60 >60 >60 >60  --   --   ?GFRAA QUESTIONABLE RESULTS, RECOMMEND RECOLLECT TO VERIFY  --   --   --   --   --   --   --   ? < > = values in this interval not displayed.  ? ? ?LIVER FUNCTION TESTS: ?Recent Labs  ?  10/18/20 ?0734 10/22/20 ?0326 10/27/20 ?1053 10/29/20 ?0706  ?BILITOT 1.0 0.8 0.8 0.9  ?AST 61* 37 39 27  ?ALT 136* 79* 57* 45*  ?ALKPHOS 129* 116 95 84  ?PROT 6.3* 5.6* 5.7* 5.9*  ?ALBUMIN 2.8* 2.5* 2.9* 2.9*  ? ? ?Assessment and Plan: ?66 year old male with history of hemorrhagic stroke status post IVC filter placement on 10/08/2020 with subsequent propagation of provoked, right greater than left lower extremity deep vein thrombosis with ileocaval involvement and near occlusion of the inferior vena cava. Status post suction thrombectomy on  10/27/2020 with inadequate clearance of iliocaval thrombus followed by repeat mechanical thrombectomy on 11/02/2020.   Remarkably, Mr. Zibell now has complete resolution of iliocaval thrombus and right femoral DVT on

## 2021-09-17 ENCOUNTER — Telehealth (HOSPITAL_COMMUNITY): Payer: Self-pay

## 2021-09-17 ENCOUNTER — Other Ambulatory Visit (HOSPITAL_COMMUNITY): Payer: Self-pay | Admitting: Interventional Radiology

## 2021-09-17 DIAGNOSIS — Z95828 Presence of other vascular implants and grafts: Secondary | ICD-10-CM

## 2021-09-17 NOTE — Progress Notes (Signed)
? ?Synopsis: Referred for post hospitalization followo up by Clovis Riley, L.August Saucer, MD ? ?Subjective:  ? ?PATIENT ID: Walter [name] GENDER: male DOB: Apr 02, 1956, MRN: [id number] ? ?No chief complaint on file. ? ?66yM with history of AF/prior ablation, acute IPH c/b R hemiplegia and VDRF, tracheostomy placement s/p decannulation 11/09/20, LE DVT c/b obstructed/clotted IVC filter requiring mechanical thrombectomy and removal of IVC filter - now on eliquis 5 BID. Discharged to [facility] 10/28/20, then home 11/23/20. Still has substantial difficulty with balance, falls to left frequently. ? ?He says he used to work out frequently before his hemorrhagic stroke. Has been told that he was a little 'breathy' in rehab. His wife says he frequently sounds like he's panting. Has not done nearly the same amount of cardio now that he was doing before stroke. He has no cough, no chest congestion. Does have trouble swallowing some liquids but tries to enact measures he's learned to minimize aspiration.  ? ?Had sleep study this year done ordered by his cardiologist. He thinks it showed mild OSA. He is unsure if there was CSA on it.  ? ?Never smoker, worked in [employer], [employer]. Has dog at home.  ? ?Interval HPI: ?Last seen by me a month ago with plan for PFT, abg, labs. ABG without significant hypercapnia or hypoxia. Labs unremarkable. PFT today ? ?IR plans to remove IVC filter later this month. He has been referred for watchman device placement by cardiology.  ? ?Otherwise pertinent review of systems is negative. ? ?Past Medical History:  ?Diagnosis Date  ? Atrial flutter (HCC)   ? s/p ablation  ? DVT (deep venous thrombosis) (HCC)   ? Near syncope 09/25/2013  ? Paroxysmal atrial fibrillation (HCC)   ? Stroke [facility])   ? initially hemorragic (not on OAC) followed by subsequent embolic stroke  ?  ? ?Family History  ?Problem Relation Age of Onset  ? Heart disease Mother   ? Heart disease Father   ? Healthy Sister   ? Healthy Brother   ?  ? ?Past Surgical  History:  ?Procedure Laterality Date  ? A-FLUTTER ABLATION N/A 03/09/2020  ? Procedure: A-FLUTTER ABLATION;  Surgeon: Hillis Range, MD;  Location: [facility] INVASIVE [facility];  Service: Cardiovascular;  Laterality: N/A;  ? CARDIOVERSION N/A 09/27/2013  ? Procedure: CARDIOVERSION;  Surgeon: Lewayne Bunting, MD;  Location: [facility] ENDOSCOPY;  Service: Cardiovascular;  Laterality: N/A;  ? IR ANGIO EXTERNAL CAROTID SEL EXT CAROTID UNI R MOD SED  09/26/2020  ? IR ANGIO INTRA EXTRACRAN SEL COM CAROTID INNOMINATE BILAT MOD SED  09/26/2020  ? IR ANGIO VERTEBRAL SEL VERTEBRAL UNI R MOD SED  09/26/2020  ? IR IVC FILTER PLMT / S&I /IMG GUID/MOD SED  10/08/2020  ? IR IVUS EACH ADDITIONAL NON CORONARY VESSEL  11/02/2020  ? IR PTA VENOUS EXCEPT DIALYSIS CIRCUIT  11/02/2020  ? IR RADIOLOGIST EVAL & MGMT  01/17/2021  ? IR RADIOLOGIST EVAL & MGMT  09/12/2021  ? IR THROMBECT VENO MECH MOD SED  10/27/2020  ? IR THROMBECT VENO MECH MOD SED  11/02/2020  ? IR US GUIDE VASC ACCESS LEFT  10/27/2020  ? IR US GUIDE VASC ACCESS RIGHT  09/26/2020  ? IR US GUIDE VASC ACCESS RIGHT  11/02/2020  ? IR VENO/EXT/UNI RIGHT  11/02/2020  ? IR VENOCAVAGRAM IVC  10/27/2020  ? RETINAL DETACHMENT SURGERY    ? right knee arthroscopy    ? TEE WITHOUT CARDIOVERSION N/A 09/27/2013  ? Procedure: TRANSESOPHAGEAL ECHOCARDIOGRAM (TEE);  Surgeon: Lewayne Bunting,  MD;  Location: [facility] ENDOSCOPY;  Service: Cardiovascular;  Laterality: N/A;  ? WRIST SURGERY    ? ? ?Social History  ? ?Socioeconomic History  ? Marital status: Married  ?  Spouse name: cathy  ? Number of children: Not on file  ? Years of education: Not on file  ? Highest education level: Not on file  ?Occupational History  ? Not on file  ?Tobacco Use  ? Smoking status: Never  ? Smokeless tobacco: Never  ?Vaping Use  ? Vaping Use: Never used  ?Substance and Sexual Activity  ? Alcohol use: Yes  ?  Comment: occasionally  ? Drug use: No  ? Sexual activity: Not on file  ?Other Topics Concern  ? Not on file  ?Social History Narrative  ? Lives with  wife at home  ? Right Handed  ? Drinks decaf  ? ?Social Determinants of Health  ? ?Financial Resource Strain: Not on file  ?Food Insecurity: Not on file  ?Transportation Needs: Not on file  ?Physical Activity: Not on file  ?Stress: Not on file  ?Social Connections: Not on file  ?Intimate Partner Violence: Not on file  ?  ? ?Allergies  ?Allergen Reactions  ? Keppra [Levetiracetam] Swelling  ?  Patient experienced angioedema post inpatient keppra dose  ? Latex Itching  ?  Skin turns real red  ? Topiramate Other (See Comments)  ?  Confusion, difficulty communicating ?Other reaction(s): confusion  ?  ? ?Outpatient Medications Prior to Visit  ?Medication Sig Dispense Refill  ? apixaban (ELIQUIS) 5 MG TABS tablet TAKE 1 TABLET BY MOUTH TWICE A DAY 60 tablet 6  ? atorvastatin (LIPITOR) 20 MG tablet Take 1 tablet (20 mg total) by mouth at bedtime. 90 tablet 3  ? diltiazem (CARDIZEM CD) 120 MG 24 hr capsule Take 1 capsule (120 mg total) by mouth daily. 90 capsule 3  ? EPINEPHrine 0.3 mg/0.3 mL IJ SOAJ injection     ? gabapentin (NEURONTIN) 100 MG capsule Take 1 capsule (100 mg total) by mouth 3 (three) times daily. 90 capsule 2  ? mirtazapine (REMERON) 7.5 MG tablet TAKE 2 TABLETS BY MOUTH AT BEDTIME 180 tablet 1  ? sertraline (ZOLOFT) 100 MG tablet Take 100 mg by mouth daily.    ? ?No facility-administered medications prior to visit.  ? ? ? ? ? ?Objective:  ? ?Physical Exam: ? ?General appearance: 66 y.o., male, NAD, conversant  ?Eyes: anicteric sclerae; PERRL, tracking appropriately ?HENT: NCAT; MMM ?Neck: Trachea midline; no lymphadenopathy, no JVD ?Lungs: CTAB, no crackles, no wheeze, with normal respiratory effort ?CV: RRR, no murmur  ?Abdomen: Soft, non-tender; non-distended, BS present  ?Extremities: No peripheral edema, warm ?Skin: Normal turgor and texture; no rash ?Psych: Appropriate affect ?Neuro: Alert and oriented to person and place, no focal deficit  ? ? ? ?There were no vitals filed for this visit. ? ?  on  RA ?BMI Readings from Last 3 Encounters:  ?08/16/21 30.52 kg/m?  ?08/15/21 30.65 kg/m?  ?08/14/21 28.17 kg/m?  ? ?Wt Readings from Last 3 Encounters:  ?08/16/21 225 lb (102.1 kg)  ?08/15/21 226 lb (102.5 kg)  ?08/14/21 225 lb 6.4 oz (102.2 kg)  ? ? ? ?CBC ?   ?Component Value Date/Time  ? WBC 5.5 08/15/2021 1158  ? RBC 4.85 08/15/2021 1158  ? HGB 15.1 08/15/2021 1158  ? HGB 14.8 03/02/2020 0853  ? HCT 45.6 08/15/2021 1158  ? HCT 43.2 03/02/2020 0853  ? PLT 237.0 08/15/2021 1158  ? PLT 209  03/02/2020 0853  ? MCV 94.1 08/15/2021 1158  ? MCV 96 03/02/2020 0853  ? MCH 32.3 11/20/2020 0621  ? MCHC 33.1 08/15/2021 1158  ? RDW 14.4 08/15/2021 1158  ? RDW 12.6 03/02/2020 0853  ? LYMPHSABS 2.0 08/15/2021 1158  ? LYMPHSABS 2.2 03/02/2020 0853  ? MONOABS 0.4 08/15/2021 1158  ? EOSABS 0.1 08/15/2021 1158  ? EOSABS 0.2 03/02/2020 0853  ? BASOSABS 0.0 08/15/2021 1158  ? BASOSABS 0.1 03/02/2020 0853  ? ? ? ? ?Chest Imaging: ?US DVT 09/12/21 without DVT ? ?CT Chest w contrast 10/17/20 reviewed by me with PE , multifocal scar  ? ?FNL 12/12/20: ?   On fiberoptic laryngoscopy the vocal cords were clear bilaterally with  ?normal vocal cord mobility.  He had mild edema of the posterior vocal  ?cords over the arytenoid processes but this was minimal and symmetric.  ? ?CXR 08/15/21 reviewed by me unremarkable ? ? ?Pulmonary Functions Testing Results: ?No flowsheet data found. ? ? ?WatchPAT 04/03/21: ? ?Mild OSA AHI 11.3, O2 nadir 81%, <88% for 9 minutes ? ? ?Echocardiogram:  ?TTE 09/25/20: ? 1. Left ventricular ejection fraction, by estimation, is 55 to 60%. The  ?left ventricle has normal function. The left ventricle has no regional  ?wall motion abnormalities. Left ventricular diastolic parameters are  ?consistent with Grade I diastolic  ?dysfunction (impaired relaxation).  ? 2. Right ventricular systolic function is normal. The right ventricular  ?size is normal. There is normal pulmonary artery systolic pressure. The  ?estimated right  ventricular systolic pressure is 28.2 mmHg.  ? 3. The mitral valve is normal in structure. No evidence of mitral valve  ?regurgitation. No evidence of mitral stenosis.  ? 4. The aortic valve is tricuspid. Ao

## 2021-09-17 NOTE — Telephone Encounter (Signed)
Called to schedule IVC filter retrieval, no answer, left vm. AW  

## 2021-09-18 ENCOUNTER — Encounter: Payer: Self-pay | Admitting: Student

## 2021-09-18 ENCOUNTER — Other Ambulatory Visit: Payer: Self-pay

## 2021-09-18 ENCOUNTER — Ambulatory Visit (INDEPENDENT_AMBULATORY_CARE_PROVIDER_SITE_OTHER): Payer: Medicare Other | Admitting: Student

## 2021-09-18 VITALS — BP 124/72 | HR 77 | Temp 98.3°F | Ht 72.0 in | Wt 226.0 lb

## 2021-09-18 DIAGNOSIS — Z9889 Other specified postprocedural states: Secondary | ICD-10-CM | POA: Diagnosis not present

## 2021-09-18 DIAGNOSIS — R0600 Dyspnea, unspecified: Secondary | ICD-10-CM | POA: Diagnosis not present

## 2021-09-19 ENCOUNTER — Encounter: Payer: Self-pay | Admitting: Physical Therapy

## 2021-09-19 ENCOUNTER — Ambulatory Visit: Payer: Medicare Other | Admitting: Physical Therapy

## 2021-09-19 ENCOUNTER — Ambulatory Visit: Payer: Medicare Other | Admitting: Occupational Therapy

## 2021-09-19 ENCOUNTER — Encounter: Payer: Self-pay | Admitting: Occupational Therapy

## 2021-09-19 DIAGNOSIS — R2689 Other abnormalities of gait and mobility: Secondary | ICD-10-CM

## 2021-09-19 DIAGNOSIS — R2681 Unsteadiness on feet: Secondary | ICD-10-CM

## 2021-09-19 DIAGNOSIS — M6281 Muscle weakness (generalized): Secondary | ICD-10-CM

## 2021-09-19 DIAGNOSIS — R270 Ataxia, unspecified: Secondary | ICD-10-CM | POA: Diagnosis not present

## 2021-09-19 DIAGNOSIS — R41842 Visuospatial deficit: Secondary | ICD-10-CM

## 2021-09-19 DIAGNOSIS — R278 Other lack of coordination: Secondary | ICD-10-CM

## 2021-09-19 DIAGNOSIS — R41844 Frontal lobe and executive function deficit: Secondary | ICD-10-CM

## 2021-09-19 NOTE — Therapy (Deleted)
[facility] ?[address] ?[place], [place], [address] ?Phone: [phone]   Fax:  [phone] ? ?Occupational Therapy Treatment ? ?Patient Details  ?Name: Walter [name] ?MRN: [id number] ?Date of Birth: June 29, 1956 ?Referring Provider (OT): Dr. Claudette Laws ? ? ?Encounter Date: 09/19/2021 ? ? OT End of Session - 09/19/21 1403   ? ? Visit Number 2   ? Number of Visits 25   25 visits MAX as patient is scheduled for 1x a week unless needing 2 for aquatics therapy  ? Date for OT Re-Evaluation 11/28/21   ? Authorization Type Medicare Part A & B   ? Progress Note Due on Visit 10   ? OT Start Time 1401   ? OT Stop Time 1445   ? OT Time Calculation (min) 44 min   ? Activity Tolerance Patient tolerated treatment well   ? Behavior During Therapy [facility] for tasks assessed/performed;Restless   ? ?  ?  ? ?  ? ? ?Past Medical History:  ?Diagnosis Date  ? Atrial flutter (HCC)   ? s/p ablation  ? DVT (deep venous thrombosis) (HCC)   ? Near syncope 09/25/2013  ? Paroxysmal atrial fibrillation (HCC)   ? Stroke [facility])   ? initially hemorragic (not on OAC) followed by subsequent embolic stroke  ? ? ?Past Surgical History:  ?Procedure Laterality Date  ? A-FLUTTER ABLATION N/A 03/09/2020  ? Procedure: A-FLUTTER ABLATION;  Surgeon: Hillis Range, MD;  Location: [facility] INVASIVE CV LAB;  Service: Cardiovascular;  Laterality: N/A;  ? CARDIOVERSION N/A 09/27/2013  ? Procedure: CARDIOVERSION;  Surgeon: Lewayne Bunting, MD;  Location: [facility] ENDOSCOPY;  Service: Cardiovascular;  Laterality: N/A;  ? IR ANGIO EXTERNAL CAROTID SEL EXT CAROTID UNI R MOD SED  09/26/2020  ? IR ANGIO INTRA EXTRACRAN SEL COM CAROTID INNOMINATE BILAT MOD SED  09/26/2020  ? IR ANGIO VERTEBRAL SEL VERTEBRAL UNI R MOD SED  09/26/2020  ? IR IVC FILTER PLMT / S&I /IMG GUID/MOD SED  10/08/2020  ? IR IVUS EACH ADDITIONAL NON CORONARY VESSEL  11/02/2020  ? IR PTA VENOUS EXCEPT DIALYSIS CIRCUIT  11/02/2020  ? IR RADIOLOGIST EVAL & MGMT   01/17/2021  ? IR RADIOLOGIST EVAL & MGMT  09/12/2021  ? IR THROMBECT VENO MECH MOD SED  10/27/2020  ? IR THROMBECT VENO MECH MOD SED  11/02/2020  ? IR US GUIDE VASC ACCESS LEFT  10/27/2020  ? IR US GUIDE VASC ACCESS RIGHT  09/26/2020  ? IR US GUIDE VASC ACCESS RIGHT  11/02/2020  ? IR VENO/EXT/UNI RIGHT  11/02/2020  ? IR VENOCAVAGRAM IVC  10/27/2020  ? RETINAL DETACHMENT SURGERY    ? right knee arthroscopy    ? TEE WITHOUT CARDIOVERSION N/A 09/27/2013  ? Procedure: TRANSESOPHAGEAL ECHOCARDIOGRAM (TEE);  Surgeon: Lewayne Bunting, MD;  Location: [facility] ENDOSCOPY;  Service: Cardiovascular;  Laterality: N/A;  ? WRIST SURGERY    ? ? ?There were no vitals filed for this visit. ? ? ? ? ? ? ? ? ? ? ? ? ? ? ? ? ? ? ? ? ? ? ? ? ? ? OT Short Term Goals - 09/05/21 1457   ? ?  ? OT SHORT TERM GOAL #1  ? Title Pt will be independent with HEP for LUE coordination   ? Time 4   ? Period Weeks   ? Status New   ? Target Date 10/03/21   ?  ? OT SHORT TERM GOAL #2  ? Title Pt will  verbalize understanding of sensory strategies for RUE sensory deficits.   ? Time 4   ? Period Weeks   ? Status New   ?  ? OT SHORT TERM GOAL #3  ? Title Pt will improve LUE coordination for ADLs as shown by improving score on box and blocks test by at least 6.   ? Baseline L 18 R 39   ? Time 4   ? Period Weeks   ? Status New   ? ?  ?  ? ?  ? ? ? ? OT Long Term Goals - 09/05/21 1459   ? ?  ? OT LONG TERM GOAL #1  ? Title Pt will be independent with Aquatics HEP PRN   ? Time 12   ? Period Weeks   ? Status New   ? Target Date 11/28/21   ?  ? OT LONG TERM GOAL #2  ? Title Pt will improve LUE hand coordination for ADLs as shown by completing 9-hole peg test in less than 90 seconds   ? Baseline 5 pegs in 2 minutes with several drops and knocking pegs out   ? Time 12   ? Period Weeks   ? Status New   ?  ? OT LONG TERM GOAL #3  ? Title Pt will verbalize understanding of visual compensation strategies and functional scanning strategies for increasing safety with ADLs and IADLs.   ?  Time 12   ? Period Weeks   ? Status New   ?  ? OT LONG TERM GOAL #4  ? Title Pt will perform environmental scanning with 90% accuracy for increased safety.   ?  ? OT LONG TERM GOAL #5  ? Title Pt will demo good safety awareness/compensation for sensory deficits and attention to LUE during functional tasks and transfers without cueing.   ? Time 12   ? Period Weeks   ? Status New   ?  ? OT LONG TERM GOAL #6  ? Title Pt will report increased ease and accuracy with handwriting and texting with visual strategies and compensations as needed.   ? Time 12   ? Period Weeks   ? Status New   ? ?  ?  ? ?  ? ? ? ? ? ? ? ? Plan - 09/19/21 1359   ? ? Clinical Impression Statement Pt verbalized understanding of goals.   ? OT Occupational Profile and History Detailed Assessment- Review of Records and additional review of physical, cognitive, psychosocial history related to current functional performance   ? Occupational performance deficits (Please refer to evaluation for details): ADL's;IADL's;Work;Leisure;Social Participation   ? Body Structure / Function / Physical Skills ADL;Strength;Balance;Proprioception;UE functional use;IADL;Endurance;Vision;Mobility;Coordination;Decreased knowledge of precautions;FMC;GMC;Sensation   ? Cognitive Skills Attention;Safety Awareness;Memory;Perception;Problem Solve   ? Rehab Potential Fair   d/t duration from initial event  ? Clinical Decision Making Several treatment options, min-mod task modification necessary   ? Comorbidities Affecting Occupational Performance: May have comorbidities impacting occupational performance   ? Modification or Assistance to Complete Evaluation  Min-Moderate modification of tasks or assist with assess necessary to complete eval   ? OT Frequency Other (comment)   1-2 visits based on need for aquatics  ? OT Duration 12 weeks   may d/c early based on progress or change frequency based on aquatics and need for more visits 1-2 visits over 12 weeks.  ? OT  Treatment/Interventions Self-care/ADL training;Moist Heat;Fluidtherapy;DME and/or AE instruction;Balance training;Therapeutic activities;Aquatic Therapy;Ultrasound;Therapeutic exercise;Cognitive remediation/compensation;Visual/perceptual remediation/compensation;Functional Mobility Training;Neuromuscular education;Cryotherapy;Energy conservation;Manual  Therapy;Patient/family education   ? Plan review coordination activities, strategies for LUE ataxia, aquatics?   ? Consulted and Agree with Plan of Care Patient   ? ?  ?  ? ?  ? ? ?Patient will benefit from skilled therapeutic intervention in order to improve the following deficits and impairments:   ?Body Structure / Function / Physical Skills: ADL, Strength, Balance, Proprioception, UE functional use, IADL, Endurance, Vision, Mobility, Coordination, Decreased knowledge of precautions, FMC, GMC, Sensation ?Cognitive Skills: Attention, Safety Awareness, Memory, Perception, Problem Solve ?  ? ? ?Visit Diagnosis: ?Visuospatial deficit ? ?Frontal lobe and executive function deficit ? ?Muscle weakness (generalized) ? ?Unsteadiness on feet ? ?Other abnormalities of gait and mobility ? ?Other lack of coordination ? ? ? ?Problem List ?Patient Active Problem List  ? Diagnosis Date Noted  ? Hypertension 08/16/2021  ? 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 Residual cognitive deficit as late effect of stroke 04/19/2021  ? Hemiparesis affecting right side as late effect of stroke (HCC) 04/19/2021  ? 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 (HCC) 04/19/2021  ? Thrombosis   ? Sleep disturbance   ? Dysphagia, post-stroke   ? PAF (paroxysmal atrial fibrillation) (HCC)   ? Acute pulmonary embolism without acute cor pulmonale (HCC)   ? Malnutrition of moderate degree 11/04/2020  ? Tracheostomy care [facility])   ? Pressure injury of skin 10/23/2020  ? Intracranial hemorrhage (HCC)   ? Atrial fibrillation (HCC)   ?  Prediabetes   ? Leukocytosis   ? Acute blood loss anemia   ? Hypernatremia   ? ICH (intracerebral hemorrhage) (HCC) 09/25/2020  ? Unilateral primary osteoarthritis, left knee 09/26/2016  ? Atrial flutter (HCC) 09/25/2013

## 2021-09-19 NOTE — Patient Instructions (Signed)
Access Code: [email or web address] ?URL: [email or web address] ?Date: 09/19/2021 ?Prepared by: Camille Bal ? ?Exercises ?Supine Bridge - 1 x daily - 5 x weekly - 2 sets - 8 reps ?Supine 90/90 Alternating Toe Touch - 1 x daily - 5 x weekly - 2 sets - 10 reps ?Supine Single Leg Extensions - 1 x daily - 5 x weekly - 2 sets - 10 reps ?Sit to Stand with Hands on Knees - 1 x daily - 5 x weekly - 2 sets - 10 reps ? ?

## 2021-09-19 NOTE — Therapy (Deleted)
?OUTPATIENT OCCUPATIONAL THERAPY TREATMENT NOTE ? ? ?Patient Name: Walter [name] ?MRN: [id number] ?DOB:07-Jul-1955, 66 y.o., male ?Today's Date: 09/19/2021 ? ?PCP: Alroy Dust, L.Marlou Sa, MD ?REFERRING PROVIDER: Alroy Dust, L.Marlou Sa, MD ? ? ? ?Past Medical History:  ?Diagnosis Date  ? Atrial flutter ([place])   ? s/p ablation  ? DVT (deep venous thrombosis) ([place])   ? Near syncope 09/25/2013  ? Paroxysmal atrial fibrillation (HCC)   ? Stroke [facility])   ? initially hemorragic (not on [place]) followed by subsequent embolic stroke  ? ?Past Surgical History:  ?Procedure Laterality Date  ? A-FLUTTER ABLATION N/A 03/09/2020  ? Procedure: A-FLUTTER ABLATION;  Surgeon: Thompson Grayer, MD;  Location: [place] CV LAB;  Service: Cardiovascular;  Laterality: N/A;  ? CARDIOVERSION N/A 09/27/2013  ? Procedure: CARDIOVERSION;  Surgeon: Lelon Perla, MD;  Location: [facility] ENDOSCOPY;  Service: Cardiovascular;  Laterality: N/A;  ? IR ANGIO EXTERNAL CAROTID SEL EXT CAROTID UNI R MOD SED  09/26/2020  ? IR ANGIO INTRA EXTRACRAN SEL COM CAROTID INNOMINATE BILAT MOD SED  09/26/2020  ? IR ANGIO VERTEBRAL SEL VERTEBRAL UNI R MOD SED  09/26/2020  ? IR IVC FILTER PLMT / S&I /IMG GUID/MOD SED  10/08/2020  ? IR IVUS EACH ADDITIONAL NON CORONARY VESSEL  11/02/2020  ? IR PTA VENOUS EXCEPT DIALYSIS CIRCUIT  11/02/2020  ? IR RADIOLOGIST EVAL & MGMT  01/17/2021  ? IR RADIOLOGIST EVAL & MGMT  09/12/2021  ? IR THROMBECT VENO MECH MOD SED  10/27/2020  ? IR THROMBECT VENO MECH MOD SED  11/02/2020  ? IR US GUIDE VASC ACCESS LEFT  10/27/2020  ? IR US GUIDE VASC ACCESS RIGHT  09/26/2020  ? IR US GUIDE VASC ACCESS RIGHT  11/02/2020  ? IR VENO/EXT/UNI RIGHT  11/02/2020  ? IR VENOCAVAGRAM IVC  10/27/2020  ? RETINAL DETACHMENT SURGERY    ? right knee arthroscopy    ? TEE WITHOUT CARDIOVERSION N/A 09/27/2013  ? Procedure: TRANSESOPHAGEAL ECHOCARDIOGRAM (TEE);  Surgeon: Lelon Perla, MD;  Location: [place];  Service: Cardiovascular;  Laterality: N/A;  ? WRIST SURGERY    ? ?Patient Active Problem  List  ? Diagnosis Date Noted  ? Hypertension 08/16/2021  ? 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 Residual cognitive deficit as late effect of stroke 04/19/2021  ? Hemiparesis affecting right side as late effect of stroke ([place]) 04/19/2021  ? 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 ([place]) 04/19/2021  ? Thrombosis   ? Sleep disturbance   ? Dysphagia, post-stroke   ? PAF (paroxysmal atrial fibrillation) ([place])   ? Acute pulmonary embolism without acute cor pulmonale (HCC)   ? Malnutrition of moderate degree 11/04/2020  ? Tracheostomy care [facility])   ? Pressure injury of skin 10/23/2020  ? Intracranial hemorrhage ([place])   ? Atrial fibrillation ([place])   ? Prediabetes   ? Leukocytosis   ? Acute blood loss anemia   ? Hypernatremia   ? ICH (intracerebral hemorrhage) ([place]) 09/25/2020  ? Unilateral primary osteoarthritis, left knee 09/26/2016  ? Atrial flutter ([place]) 09/25/2013  ? Near syncope 09/25/2013  ? ? ?ONSET DATE: *** ? ?REFERRING DIAG: *** ? ?THERAPY DIAG:  ?No diagnosis found. ? ? ?PERTINENT HISTORY: *** ? ?PRECAUTIONS: *** ? ?SUBJECTIVE: *** ? ?PAIN:  ?Are you having pain? {OPRCPAIN:27236} ? ? ? ? ?OBJECTIVE:  ? ?TODAY'S TREATMENT:  ?*** ? ? ?PATIENT EDUCATION: ?Education details: *** ?Person educated: {Person educated:25204} ?Education method: {Education Method:25205} ?Education comprehension: {Education Comprehension:25206} ? ? ?  HOME EXERCISE PROGRAM ?*** ? ? ? OT Short Term Goals - 09/05/21 1457   ? ?  ? OT SHORT TERM GOAL #1  ? Title Pt will be independent with HEP for LUE coordination   ? Time 4   ? Period Weeks   ? Status New   ? Target Date 10/03/21   ?  ? OT SHORT TERM GOAL #2  ? Title Pt will verbalize understanding of sensory strategies for RUE sensory deficits.   ? Time 4   ? Period Weeks   ? Status New   ?  ? OT SHORT TERM GOAL #3  ? Title Pt will improve LUE coordination for ADLs as shown by improving score on box and blocks test by  at least 6.   ? Baseline L 18 R 39   ? Time 4   ? Period Weeks   ? Status New   ? ?  ?  ? ?  ? ? ? OT Long Term Goals - 09/05/21 1459   ? ?  ? OT LONG TERM GOAL #1  ? Title Pt will be independent with Aquatics HEP PRN   ? Time 12   ? Period Weeks   ? Status New   ? Target Date 11/28/21   ?  ? OT LONG TERM GOAL #2  ? Title Pt will improve LUE hand coordination for ADLs as shown by completing 9-hole peg test in less than 90 seconds   ? Baseline 5 pegs in 2 minutes with several drops and knocking pegs out   ? Time 12   ? Period Weeks   ? Status New   ?  ? OT LONG TERM GOAL #3  ? Title Pt will verbalize understanding of visual compensation strategies and functional scanning strategies for increasing safety with ADLs and IADLs.   ? Time 12   ? Period Weeks   ? Status New   ?  ? OT LONG TERM GOAL #4  ? Title Pt will perform environmental scanning with 90% accuracy for increased safety.   ?  ? OT LONG TERM GOAL #5  ? Title Pt will demo good safety awareness/compensation for sensory deficits and attention to LUE during functional tasks and transfers without cueing.   ? Time 12   ? Period Weeks   ? Status New   ?  ? OT LONG TERM GOAL #6  ? Title Pt will report increased ease and accuracy with handwriting and texting with visual strategies and compensations as needed.   ? Time 12   ? Period Weeks   ? Status New   ? ?  ?  ? ?  ? ? ? Plan - 09/19/21 1359   ? ? Clinical Impression Statement Pt verbalized understanding of goals.   ? OT Occupational Profile and History Detailed Assessment- Review of Records and additional review of physical, cognitive, psychosocial history related to current functional performance   ? Occupational performance deficits (Please refer to evaluation for details): ADL's;IADL's;Work;Leisure;Social Participation   ? Body Structure / Function / Physical Skills ADL;Strength;Balance;Proprioception;UE functional use;IADL;Endurance;Vision;Mobility;Coordination;Decreased knowledge of  precautions;FMC;GMC;Sensation   ? Cognitive Skills Attention;Safety Awareness;Memory;Perception;Problem Solve   ? Rehab Potential Fair   d/t duration from initial event  ? Clinical Decision Making Several treatment options, min-mod task modification necessary   ? Comorbidities Affecting Occupational Performance: May have comorbidities impacting occupational performance   ? Modification or Assistance to Complete Evaluation  Min-Moderate modification of tasks or assist with assess necessary to complete  eval   ? OT Frequency Other (comment)   1-2 visits based on need for aquatics  ? OT Duration 12 weeks   may d/c early based on progress or change frequency based on aquatics and need for more visits 1-2 visits over 12 weeks.  ? OT Treatment/Interventions Self-care/ADL training;Moist Heat;Fluidtherapy;DME and/or AE instruction;Balance training;Therapeutic activities;Aquatic Therapy;Ultrasound;Therapeutic exercise;Cognitive remediation/compensation;Visual/perceptual remediation/compensation;Functional Mobility Training;Neuromuscular education;Cryotherapy;Energy conservation;Manual Therapy;Patient/family education   ? Plan review coordination activities, strategies for LUE ataxia, aquatics?   ? Consulted and Agree with Plan of Care Patient   ? ?  ?  ? ?  ? ? ? ?Zachery Conch, OT ?09/19/2021, 2:00 PM ? ?  ? ? ? ?

## 2021-09-19 NOTE — Therapy (Signed)
?OUTPATIENT PHYSICAL THERAPY TREATMENT NOTE ? ? ?Patient Name: Walter [name] ?MRN: [id number] ?DOB:October 07, 1955, 66 y.o., male ?Today's Date: 09/19/2021 ? ?PCP: [place] [facility], L.August Saucer, MD ?REFERRING PROVIDER: Erick Colace, MD ? ? PT End of Session - 09/19/21 1455   ? ? Visit Number 2   ? Number of Visits 9   ? Date for PT Re-Evaluation 11/02/21   ? Authorization Type Medicare so 10th visit progress note   ? PT Start Time 1448   ? PT Stop Time 1534   ? PT Time Calculation (min) 46 min   ? Equipment Utilized During Treatment Gait belt   ? Activity Tolerance Patient tolerated treatment well   ? Behavior During Therapy Impulsive;WFL for tasks assessed/performed   ? ?  ?  ? ?  ? ? ?Past Medical History:  ?Diagnosis Date  ? Atrial flutter (HCC)   ? s/p ablation  ? DVT (deep venous thrombosis) (HCC)   ? Near syncope 09/25/2013  ? Paroxysmal atrial fibrillation (HCC)   ? Stroke [facility])   ? initially hemorragic (not on [place]) followed by subsequent embolic stroke  ? ?Past Surgical History:  ?Procedure Laterality Date  ? A-FLUTTER ABLATION N/A 03/09/2020  ? Procedure: A-FLUTTER ABLATION;  Surgeon: Hillis Range, MD;  Location: [facility] INVASIVE CV LAB;  Service: Cardiovascular;  Laterality: N/A;  ? CARDIOVERSION N/A 09/27/2013  ? Procedure: CARDIOVERSION;  Surgeon: Lewayne Bunting, MD;  Location: [facility] ENDOSCOPY;  Service: Cardiovascular;  Laterality: N/A;  ? IR ANGIO EXTERNAL CAROTID SEL EXT CAROTID UNI R MOD SED  09/26/2020  ? IR ANGIO INTRA EXTRACRAN SEL COM CAROTID INNOMINATE BILAT MOD SED  09/26/2020  ? IR ANGIO VERTEBRAL SEL VERTEBRAL UNI R MOD SED  09/26/2020  ? IR IVC FILTER PLMT / S&I /IMG GUID/MOD SED  10/08/2020  ? IR IVUS EACH ADDITIONAL NON CORONARY VESSEL  11/02/2020  ? IR PTA VENOUS EXCEPT DIALYSIS CIRCUIT  11/02/2020  ? IR RADIOLOGIST EVAL & MGMT  01/17/2021  ? IR RADIOLOGIST EVAL & MGMT  09/12/2021  ? IR THROMBECT VENO MECH MOD SED  10/27/2020  ? IR THROMBECT VENO MECH MOD SED  11/02/2020  ? IR US GUIDE VASC ACCESS LEFT  10/27/2020  ? IR  US GUIDE VASC ACCESS RIGHT  09/26/2020  ? IR US GUIDE VASC ACCESS RIGHT  11/02/2020  ? IR VENO/EXT/UNI RIGHT  11/02/2020  ? IR VENOCAVAGRAM IVC  10/27/2020  ? RETINAL DETACHMENT SURGERY    ? right knee arthroscopy    ? TEE WITHOUT CARDIOVERSION N/A 09/27/2013  ? Procedure: TRANSESOPHAGEAL ECHOCARDIOGRAM (TEE);  Surgeon: Lewayne Bunting, MD;  Location: [facility] ENDOSCOPY;  Service: Cardiovascular;  Laterality: N/A;  ? WRIST SURGERY    ? ?Patient Active Problem List  ? Diagnosis Date Noted  ? Hypertension 08/16/2021  ? 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 Residual cognitive deficit as late effect of stroke 04/19/2021  ? Hemiparesis affecting right side as late effect of stroke (HCC) 04/19/2021  ? 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 (HCC) 04/19/2021  ? Thrombosis   ? Sleep disturbance   ? Dysphagia, post-stroke   ? PAF (paroxysmal atrial fibrillation) (HCC)   ? Acute pulmonary embolism without acute cor pulmonale (HCC)   ? Malnutrition of moderate degree 11/04/2020  ? Tracheostomy care [facility])   ? Pressure injury of skin 10/23/2020  ? Intracranial hemorrhage (HCC)   ? Atrial fibrillation (HCC)   ? Prediabetes   ? Leukocytosis   ?  Acute blood loss anemia   ? Hypernatremia   ? ICH (intracerebral hemorrhage) (HCC) 09/25/2020  ? Unilateral primary osteoarthritis, left knee 09/26/2016  ? Atrial flutter (HCC) 09/25/2013  ? Near syncope 09/25/2013  ? ? ?ONSET DATE: 08/29/2021 ( CVA was 09/25/20) ?  ?REFERRING DIAG: I62.9 (ICD-10-CM) - Intracranial hemorrhage  ? ?THERAPY DIAG:  ?Other abnormalities of gait and mobility ? ?Other lack of coordination ? ?Muscle weakness (generalized) ? ?Unsteadiness on feet ? ?PERTINENT HISTORY: atrial flutter s/p ablation, DVT with IVC filter, near syncope, CVA- Brainstem bleed involving the left midbrain, pons as well as left thalamus. 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 ?PRECAUTIONS: Fall  ? ?SUBJECTIVE: Pt started seeing eye therapist in [place], Dr. Rigoberto Noel.  He has been doing their recommended eye exercises.   ? ?PAIN:  ?Are you having pain? No ? ?Assessed BERG: high fall risk ? [facility] PT Assessment - 09/19/21 1458   ? ?  ? Berg Balance Test  ? Sit to Stand Able to stand  independently using hands   ? Standing Unsupported Able to stand 2 minutes with supervision   ? Sitting with Back Unsupported but Feet Supported on Floor or Stool Able to sit safely and securely 2 minutes   ? Stand to Sit Sits safely with minimal use of hands   ? Transfers Able to transfer with verbal cueing and /or supervision   impulsively goes to stand from standard chair pushing chair back with BLE  ? Standing Unsupported with Eyes Closed Able to stand 10 seconds with supervision   ? Standing Unsupported with Feet Together Able to place feet together independently and stand for 1 minute with supervision   ? From Standing, Reach Forward with Outstretched Arm Can reach forward >12 cm safely (5")   ? From Standing Position, Pick up Object from Floor Unable to pick up and needs supervision   ? From Standing Position, Turn to Look Behind Over each Shoulder Needs assist to keep from losing balance and falling   Uses RW for balance  ? Turn 360 Degrees Needs assistance while turning   MinA w/ RW >4 sec each side, dec initiation  ? Standing Unsupported, Alternately Place Feet on Step/Stool Needs assistance to keep from falling or unable to try   Major LOB requiring modA to recover w/ lifting L to remove from step  ? Standing Unsupported, One Foot in Front Needs help to step but can hold 15 seconds   ? Standing on One Leg Tries to lift leg/unable to hold 3 seconds but remains standing independently   ? Total Score 28   ? ?  ?  ? ?  ? ?Initiated HEP:  see below. ? ?Access Code: [email or web address] ?URL: [email or web address] ?Date: 09/19/2021 ?Prepared by: Camille Bal ? ?Exercises ?Supine Bridge - 1 x daily - 5 x weekly - 2 sets - 8 reps ?Supine 90/90 Alternating Toe Touch - 1 x daily - 5 x weekly - 2 sets - 10 reps ?Supine Single Leg Extensions - 1 x daily - 5 x weekly - 2 sets - 10 reps ?Sit to Stand with Hands on Knees - 1 x daily - 5 x weekly - 2 sets - 10 reps-Used mirror for visual feedback with improved symmetrical LE weight bearing, cued at shoulders for upright trunk ? ?Pt standing w/ RW in front of mirror EOM performing toe taps to purple dot at midline  and out to ipsilateral side for motor recruitment and control.  He demos inc ataxia with LLE>RLE with mild improvement with repetition.  Pt has progressive forward lean during task requiring cuing to use mirror for postural feedback. ? ?OBJECTIVE:  ?  ?  ?Vitals: BP=130/82, HR=96 ?COGNITION: ?Overall cognitive status: Impaired/different from baseline. Pt reports that things are not as clear at times. Impulsive ?  ?SENSATION: ?Light touch: Appears intact ?Hot/Cold: Appears intact, sharp/dull impaired at right thumb ?Proprioception: Deficits impaired thumb find on right but seems to be more coordination related.  Proprioception was intact at distal thumb. ?Vision: pt has double vision with tape applied to right medial lens. Was keeping right eye closed stating he needed more tape for left lens. PT cut tape a applied to medial left lens of glasses and pt was able to keep both eyes open with improvement in double vision. Has been to neuro eye doctor and trying to return for vision rehab. ?COORDINATION: ?RAMs impaired on left, finger to nose impaired on left, heel/shin impaired on left ?  ?EDEMA:  ?None noted at eval ?  ?  ?POSTURE: rounded shoulders ?  ?AROM/PROM:   ?  ?A/PROM Right ?09/06/2021 Left ?09/06/2021  ?Hip flexion      ?Hip extension      ?Hip abduction      ?Hip adduction      ?Hip internal rotation      ?Hip external rotation      ?Knee flexion      ?Knee extension      ?Ankle dorsiflexion      ?Ankle  plantarflexion      ?Ankle inversion      ?Ankle eversion      ? (Blank rows = not tested) ?MMT: ?  ?MMT Right ?09/06/2021 Left ?09/06/2021  ?Hip flexion 5/5 5/5  ?Hip extension      ?Hip abduction 5/5 5/5  ?Hip adduction 5/5 5/5

## 2021-09-19 NOTE — Therapy (Signed)
[facility] ?[place], [place], [address] ?Phone: [phone]   Fax:  [phone] ? ?Occupational Therapy Treatment ? ?Patient Details  ?Name: Walter [name] ?MRN: [id number] ?Date of Birth: December 03, 1955 ?Referring Provider (OT): Dr. Alysia Penna ? ? ?Encounter Date: 09/19/2021 ? ? OT End of Session - 09/19/21 1403   ? ? Visit Number 2   ? Number of Visits 25   25 visits MAX as patient is scheduled for 1x a week unless needing 2 for aquatics therapy  ? Date for OT Re-Evaluation 11/28/21   ? Authorization Type Medicare Part A & B   ? Progress Note Due on Visit 10   ? OT Start Time 1401   ? OT Stop Time [id number]   ? OT Time Calculation (min) 44 min   ? Activity Tolerance Patient tolerated treatment well   ? Behavior During Therapy [facility] for tasks assessed/performed;Restless   ? ?  ?  ? ?  ? ? ?Past Medical History:  ?Diagnosis Date  ? Atrial flutter ([place])   ? s/p ablation  ? DVT (deep venous thrombosis) ([place])   ? Near syncope 09/25/2013  ? Paroxysmal atrial fibrillation (HCC)   ? Stroke [facility])   ? initially hemorragic (not on [place]) followed by subsequent embolic stroke  ? ? ?Past Surgical History:  ?Procedure Laterality Date  ? A-FLUTTER ABLATION N/A 03/09/2020  ? Procedure: A-FLUTTER ABLATION;  Surgeon: Thompson Grayer, MD;  Location: [place] CV LAB;  Service: Cardiovascular;  Laterality: N/A;  ? CARDIOVERSION N/A 09/27/2013  ? Procedure: CARDIOVERSION;  Surgeon: Lelon Perla, MD;  Location: [facility] ENDOSCOPY;  Service: Cardiovascular;  Laterality: N/A;  ? IR ANGIO EXTERNAL CAROTID SEL EXT CAROTID UNI R MOD SED  09/26/2020  ? IR ANGIO INTRA EXTRACRAN SEL COM CAROTID INNOMINATE BILAT MOD SED  09/26/2020  ? IR ANGIO VERTEBRAL SEL VERTEBRAL UNI R MOD SED  09/26/2020  ? IR IVC FILTER PLMT / S&I /IMG GUID/MOD SED  10/08/2020  ? IR IVUS EACH ADDITIONAL NON CORONARY VESSEL  11/02/2020  ? IR PTA VENOUS EXCEPT DIALYSIS CIRCUIT  11/02/2020  ? IR RADIOLOGIST EVAL & MGMT   01/17/2021  ? IR RADIOLOGIST EVAL & MGMT  09/12/2021  ? IR THROMBECT VENO MECH MOD SED  10/27/2020  ? IR THROMBECT VENO MECH MOD SED  11/02/2020  ? IR US GUIDE VASC ACCESS LEFT  10/27/2020  ? IR US GUIDE VASC ACCESS RIGHT  09/26/2020  ? IR US GUIDE VASC ACCESS RIGHT  11/02/2020  ? IR VENO/EXT/UNI RIGHT  11/02/2020  ? IR VENOCAVAGRAM IVC  10/27/2020  ? RETINAL DETACHMENT SURGERY    ? right knee arthroscopy    ? TEE WITHOUT CARDIOVERSION N/A 09/27/2013  ? Procedure: TRANSESOPHAGEAL ECHOCARDIOGRAM (TEE);  Surgeon: Lelon Perla, MD;  Location: [place];  Service: Cardiovascular;  Laterality: N/A;  ? WRIST SURGERY    ? ? ?There were no vitals filed for this visit. ? ? Subjective Assessment - 09/19/21 1412   ? ? Subjective  "I have gone to Dr. Myrene Galas for 2 therapy sessions"   ? Patient is accompanied by: --   no family member present for evaluation  ? Pertinent History ICH of brainstem (complicated by hydrocephalus, DVTs, and tracheostomy).  PMH:  a-fib   ? Limitations fall risk, ataxia, impulsivity, diplopia   ? Patient Stated Goals "I am having to do for myself more in my home. The best way to move around my house"   ?  Currently in Pain? No/denies   ? Pain Score 0-No pain   ? ?  ?  ? ?  ? ? ? ? ? ?[place] Coordination HEP - Max difficulty with stacking pennies and flipping cards. Downgraded to connect four chips with placing into palm of hand in LUE and bringing to finger tips for in hand manipulation with min difficulty.  ? ?Handwriting  with visual guides with highlighter on lines and with use of foam grip on ballpoint pen with deviation from left margin and slant to right. Pt reports it's "messy" compared to prior handwriting before ICH. Worked on tracing loops, zigs and other lines with visual motor exercise.  ? ?MVPT completed today with score of 90% accuracy  - pt did well and did not present with concern for visual motor and perceptual skills however oculomotor and diplopia continue to be a deficit. Most errors  with visual memory. ? ? ? ? ? ? ? ? ? ? ? ? ? ? ? ? ? OT Short Term Goals - 09/19/21 1424   ? ?  ? OT SHORT TERM GOAL #1  ? Title Pt will be independent with HEP for LUE coordination   ? Time 4   ? Period Weeks   ? Status On-going   reviewed coordination HEP 09/19/21  ? Target Date 10/03/21   ?  ? OT SHORT TERM GOAL #2  ? Title Pt will verbalize understanding of sensory strategies for RUE sensory deficits.   ? Time 4   ? Period Weeks   ? Status On-going   ?  ? OT SHORT TERM GOAL #3  ? Title Pt will improve LUE coordination for ADLs as shown by improving score on box and blocks test by at least 6.   ? Baseline L 18 R 39   ? Time 4   ? Period Weeks   ? Status On-going   ? ?  ?  ? ?  ? ? ? ? OT Long Term Goals - 09/05/21 1459   ? ?  ? OT LONG TERM GOAL #1  ? Title Pt will be independent with Aquatics HEP PRN   ? Time 12   ? Period Weeks   ? Status New   ? Target Date 11/28/21   ?  ? OT LONG TERM GOAL #2  ? Title Pt will improve LUE hand coordination for ADLs as shown by completing 9-hole peg test in less than 90 seconds   ? Baseline 5 pegs in 2 minutes with several drops and knocking pegs out   ? Time 12   ? Period Weeks   ? Status New   ?  ? OT LONG TERM GOAL #3  ? Title Pt will verbalize understanding of visual compensation strategies and functional scanning strategies for increasing safety with ADLs and IADLs.   ? Time 12   ? Period Weeks   ? Status New   ?  ? OT LONG TERM GOAL #4  ? Title Pt will perform environmental scanning with 90% accuracy for increased safety.   ?  ? OT LONG TERM GOAL #5  ? Title Pt will demo good safety awareness/compensation for sensory deficits and attention to LUE during functional tasks and transfers without cueing.   ? Time 12   ? Period Weeks   ? Status New   ?  ? OT LONG TERM GOAL #6  ? Title Pt will report increased ease and accuracy with handwriting and texting with visual strategies and  compensations as needed.   ? Time 12   ? Period Weeks   ? Status New   ? ?  ?  ? ?   ? ? ? ? ? ? ? ? Plan - 09/19/21 1359   ? ? Clinical Impression Statement Pt verbalized understanding of goals. Continues to be encouraged to do coordination tasks.   ? OT Occupational Profile and History Detailed Assessment- Review of Records and additional review of physical, cognitive, psychosocial history related to current functional performance   ? Occupational performance deficits (Please refer to evaluation for details): ADL's;IADL's;Work;Leisure;Social Participation   ? Body Structure / Function / Physical Skills ADL;Strength;Balance;Proprioception;UE functional use;IADL;Endurance;Vision;Mobility;Coordination;Decreased knowledge of precautions;FMC;GMC;Sensation   ? Cognitive Skills Attention;Safety Awareness;Memory;Perception;Problem Solve   ? Rehab Potential Fair   d/t duration from initial event  ? Clinical Decision Making Several treatment options, min-mod task modification necessary   ? Comorbidities Affecting Occupational Performance: May have comorbidities impacting occupational performance   ? Modification or Assistance to Complete Evaluation  Min-Moderate modification of tasks or assist with assess necessary to complete eval   ? OT Frequency Other (comment)   1-2 visits based on need for aquatics  ? OT Duration 12 weeks   may d/c early based on progress or change frequency based on aquatics and need for more visits 1-2 visits over 12 weeks.  ? OT Treatment/Interventions Self-care/ADL training;Moist Heat;Fluidtherapy;DME and/or AE instruction;Balance training;Therapeutic activities;Aquatic Therapy;Ultrasound;Therapeutic exercise;Cognitive remediation/compensation;Visual/perceptual remediation/compensation;Functional Mobility Training;Neuromuscular education;Cryotherapy;Energy conservation;Manual Therapy;Patient/family education   ? Plan LUE ataxia strategies, possible aquatics? continue to assess need, functional use of LUE.   ? Consulted and Agree with Plan of Care Patient   ? ?  ?  ? ?   ? ? ?Patient will benefit from skilled therapeutic intervention in order to improve the following deficits and impairments:   ?Body Structure / Function / Physical Skills: ADL, Strength, Balance, Proprioception, UE functional [place]

## 2021-09-24 ENCOUNTER — Encounter: Payer: Self-pay | Admitting: Occupational Therapy

## 2021-09-24 ENCOUNTER — Ambulatory Visit: Payer: Medicare Other

## 2021-09-24 ENCOUNTER — Ambulatory Visit: Payer: Medicare Other | Admitting: Occupational Therapy

## 2021-09-24 ENCOUNTER — Other Ambulatory Visit: Payer: Self-pay

## 2021-09-24 VITALS — BP 138/86 | HR 84

## 2021-09-24 DIAGNOSIS — R2689 Other abnormalities of gait and mobility: Secondary | ICD-10-CM

## 2021-09-24 DIAGNOSIS — M6281 Muscle weakness (generalized): Secondary | ICD-10-CM

## 2021-09-24 DIAGNOSIS — R278 Other lack of coordination: Secondary | ICD-10-CM

## 2021-09-24 DIAGNOSIS — R2681 Unsteadiness on feet: Secondary | ICD-10-CM

## 2021-09-24 DIAGNOSIS — R270 Ataxia, unspecified: Secondary | ICD-10-CM | POA: Diagnosis not present

## 2021-09-24 DIAGNOSIS — R41842 Visuospatial deficit: Secondary | ICD-10-CM

## 2021-09-24 DIAGNOSIS — R41844 Frontal lobe and executive function deficit: Secondary | ICD-10-CM

## 2021-09-24 NOTE — Therapy (Signed)
?OUTPATIENT OCCUPATIONAL THERAPY TREATMENT NOTE ? ? ?Patient Name: Walter [name] ?MRN: [id number] ?DOB:Feb 24, 1956, 66 y.o., male ?Today's Date: 09/24/2021 ? ?PCP: Alroy Dust, L.Marlou Sa, MD ?REFERRING PROVIDER: Charlett Blake, MD ? ? OT End of Session - 09/24/21 1353   ? ? Visit Number 3   ? Number of Visits 25   25 visits MAX as patient is scheduled for 1x a week unless needing 2 for aquatics therapy  ? Date for OT Re-Evaluation 11/28/21   ? Authorization Type Medicare Part A & B   ? Progress Note Due on Visit 10   ? OT Start Time 1400   ? OT Stop Time [id number]   ? OT Time Calculation (min) 45 min   ? Activity Tolerance Patient tolerated treatment well   ? Behavior During Therapy [facility] for tasks assessed/performed;Restless   ? ?  ?  ? ?  ? ? ?Past Medical History:  ?Diagnosis Date  ? Atrial flutter ([place])   ? s/p ablation  ? DVT (deep venous thrombosis) ([place])   ? Near syncope 09/25/2013  ? Paroxysmal atrial fibrillation (HCC)   ? Stroke [facility])   ? initially hemorragic (not on [place]) followed by subsequent embolic stroke  ? ?Past Surgical History:  ?Procedure Laterality Date  ? A-FLUTTER ABLATION N/A 03/09/2020  ? Procedure: A-FLUTTER ABLATION;  Surgeon: Thompson Grayer, MD;  Location: [place] CV LAB;  Service: Cardiovascular;  Laterality: N/A;  ? CARDIOVERSION N/A 09/27/2013  ? Procedure: CARDIOVERSION;  Surgeon: Lelon Perla, MD;  Location: [facility] ENDOSCOPY;  Service: Cardiovascular;  Laterality: N/A;  ? IR ANGIO EXTERNAL CAROTID SEL EXT CAROTID UNI R MOD SED  09/26/2020  ? IR ANGIO INTRA EXTRACRAN SEL COM CAROTID INNOMINATE BILAT MOD SED  09/26/2020  ? IR ANGIO VERTEBRAL SEL VERTEBRAL UNI R MOD SED  09/26/2020  ? IR IVC FILTER PLMT / S&I /IMG GUID/MOD SED  10/08/2020  ? IR IVUS EACH ADDITIONAL NON CORONARY VESSEL  11/02/2020  ? IR PTA VENOUS EXCEPT DIALYSIS CIRCUIT  11/02/2020  ? IR RADIOLOGIST EVAL & MGMT  01/17/2021  ? IR RADIOLOGIST EVAL & MGMT  09/12/2021  ? IR THROMBECT VENO MECH MOD SED  10/27/2020  ? IR THROMBECT VENO MECH MOD  SED  11/02/2020  ? IR US GUIDE VASC ACCESS LEFT  10/27/2020  ? IR US GUIDE VASC ACCESS RIGHT  09/26/2020  ? IR US GUIDE VASC ACCESS RIGHT  11/02/2020  ? IR VENO/EXT/UNI RIGHT  11/02/2020  ? IR VENOCAVAGRAM IVC  10/27/2020  ? RETINAL DETACHMENT SURGERY    ? right knee arthroscopy    ? TEE WITHOUT CARDIOVERSION N/A 09/27/2013  ? Procedure: TRANSESOPHAGEAL ECHOCARDIOGRAM (TEE);  Surgeon: Lelon Perla, MD;  Location: [place];  Service: Cardiovascular;  Laterality: N/A;  ? WRIST SURGERY    ? ?Patient Active Problem List  ? Diagnosis Date Noted  ? Hypertension 08/16/2021  ? 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 Residual cognitive deficit as late effect of stroke 04/19/2021  ? Hemiparesis affecting right side as late effect of stroke ([place]) 04/19/2021  ? 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 ([place]) 04/19/2021  ? Thrombosis   ? Sleep disturbance   ? Dysphagia, post-stroke   ? PAF (paroxysmal atrial fibrillation) ([place])   ? Acute pulmonary embolism without acute cor pulmonale (HCC)   ? Malnutrition of moderate degree 11/04/2020  ? Tracheostomy care [facility])   ? Pressure injury of skin 10/23/2020  ? Intracranial hemorrhage ([employer])   ?  Atrial fibrillation ([place])   ? Prediabetes   ? Leukocytosis   ? Acute blood loss anemia   ? Hypernatremia   ? ICH (intracerebral hemorrhage) ([place]) 09/25/2020  ? Unilateral primary osteoarthritis, left knee 09/26/2016  ? Atrial flutter ([place]) 09/25/2013  ? Near syncope 09/25/2013  ? ? ?ONSET DATE: 09/17/20 ? ?REFERRING DIAG: I62.9 (ICD-10-CM) - Intracranial hemorrhage ([place]) ? ?THERAPY DIAG:  ?Other abnormalities of gait and mobility ? ?Other lack of coordination ? ?Muscle weakness (generalized) ? ?Visuospatial deficit ? ?Frontal lobe and executive function deficit ? ?Unsteadiness on feet ? ? ?PERTINENT HISTORY: ICH of brainstem (complicated by hydrocephalus, DVTs, and tracheostomy).  PMH:  a-fib  ? ?PRECAUTIONS: fall risk,  ataxia, impulsivity, diplopia  ? ? ?SUBJECTIVE: Pt reports weekend was good. Denies any pain.  ? ?PAIN:  ?Are you having pain? No ? ? ? ? ?OBJECTIVE:  ? ?TODAY'S TREATMENT:  ? ?09/24/21 ? ?Weight bearing in LUE for inhibition of tone and facilitation of muscle activation and increased proprioception and decreased ataxia while reaching with RUE to place 1 inch blocks. Reversed and did opposite. Pt required supervision assistance for maintaining hand position on mat for weight bearing and SBA For stability while standing at mat.  ? ?Medium Pegs with LUE with cueing for strategies for decreased ataxia. Pt copied pattern with 100% Accuracy.  ? ?Visual Scanning with word search with increased time req'd and good scanning strategy. Pt req'd min/mod cues for harder words/directions for word search. ? ? ? ? OT Short Term Goals - 09/19/21 1424   ? ?  ? OT SHORT TERM GOAL #1  ? Title Pt will be independent with HEP for LUE coordination   ? Time 4   ? Period Weeks   ? Status On-going   reviewed coordination HEP 09/19/21  ? Target Date 10/03/21   ?  ? OT SHORT TERM GOAL #2  ? Title Pt will verbalize understanding of sensory strategies for RUE sensory deficits.   ? Time 4   ? Period Weeks   ? Status Achieved pt reports testing with LUE first for temperature. 09/24/21  ?  ? OT SHORT TERM GOAL #3  ? Title Pt will improve LUE coordination for ADLs as shown by improving score on box and blocks test by at least 6.   ? Baseline L 18 R 39   ? Time 4   ? Period Weeks   ? Status On-going  Continue to work on coordination  ? ?  ?  ? ?  ? ? ? OT Long Term Goals - 09/05/21 1459   ? ?  ? OT LONG TERM GOAL #1  ? Title Pt will be independent with Aquatics HEP PRN   ? Time 12   ? Period Weeks   ? Status New   ? Target Date 11/28/21   ?  ? OT LONG TERM GOAL #2  ? Title Pt will improve LUE hand coordination for ADLs as shown by completing 9-hole peg test in less than 90 seconds   ? Baseline 5 pegs in 2 minutes with several drops and knocking pegs  out   ? Time 12   ? Period Weeks   ? Status Ongoing  Working on coordination  ?  ? OT LONG TERM GOAL #3  ? Title Pt will verbalize understanding of visual compensation strategies and functional scanning strategies for increasing safety with ADLs and IADLs.   ? Time 12   ? Period Weeks   ? Status  Ongoing  Continue progress  ?  ? OT LONG TERM GOAL #4  ? Title Pt will perform environmental scanning with 90% accuracy for increased safety.  ?Time 12   ?Period Weeks   ?Status Ongoing  ?  ?  ?  ? OT LONG TERM GOAL #5  ? Title Pt will demo good safety awareness/compensation for sensory deficits and attention to LUE during functional tasks and transfers without cueing.   ? Time 12   ? Period Weeks   ? Status Ongoing  ?  ? OT LONG TERM GOAL #6  ? Title Pt will report increased ease and accuracy with handwriting and texting with visual strategies and compensations as needed.   ? Time 12   ? Period Weeks   ? Status Ongoing   ? ?  ?  ? ?  ? ? ? Plan - 09/24/21 1354   ? ? Clinical Impression Statement Pt continues to demonstrate significant ataxia in LUE impeding overall coordination for daily activities. Pt is also attending vision therapy that is beneficial for his diplopia.   ? OT Occupational Profile and History Detailed Assessment- Review of Records and additional review of physical, cognitive, psychosocial history related to current functional performance   ? Occupational performance deficits (Please refer to evaluation for details): ADL's;IADL's;Work;Leisure;Social Participation   ? Body Structure / Function / Physical Skills ADL;Strength;Balance;Proprioception;UE functional use;IADL;Endurance;Vision;Mobility;Coordination;Decreased knowledge of precautions;FMC;GMC;Sensation   ? Cognitive Skills Attention;Safety Awareness;Memory;Perception;Problem Solve   ? Rehab Potential Fair   d/t duration from initial event  ? Clinical Decision Making Several treatment options, min-mod task modification necessary   ? Comorbidities  Affecting Occupational Performance: May have comorbidities impacting occupational performance   ? Modification or Assistance to Complete Evaluation  Min-Moderate modification of tasks or assist with assess necessar

## 2021-09-24 NOTE — Therapy (Signed)
?OUTPATIENT PHYSICAL THERAPY TREATMENT NOTE ? ? ?Patient Name: Walter [name] ?MRN: [id number] ?DOB:08/10/1955, 66 y.o., male ?Today's Date: 09/24/2021 ? ?PCP: Alroy Dust, L.Marlou Sa, MD ?REFERRING PROVIDER: Charlett Blake, MD ? ? PT End of Session - 09/24/21 1314   ? ? Visit Number 3   ? Number of Visits 9   ? Date for PT Re-Evaluation 11/02/21   ? Authorization Type Medicare so 10th visit progress note   ? PT Start Time 1315   ? PT Stop Time [id number]   ? PT Time Calculation (min) 42 min   ? Equipment Utilized During Treatment Gait belt   ? Activity Tolerance Patient tolerated treatment well   ? Behavior During Therapy Impulsive;WFL for tasks assessed/performed   ? ?  ?  ? ?  ? ? ? ?Past Medical History:  ?Diagnosis Date  ? Atrial flutter ([place])   ? s/p ablation  ? DVT (deep venous thrombosis) ([place])   ? Near syncope 09/25/2013  ? Paroxysmal atrial fibrillation (HCC)   ? Stroke [facility])   ? initially hemorragic (not on [place]) followed by subsequent embolic stroke  ? ?Past Surgical History:  ?Procedure Laterality Date  ? A-FLUTTER ABLATION N/A 03/09/2020  ? Procedure: A-FLUTTER ABLATION;  Surgeon: Thompson Grayer, MD;  Location: [place] CV LAB;  Service: Cardiovascular;  Laterality: N/A;  ? CARDIOVERSION N/A 09/27/2013  ? Procedure: CARDIOVERSION;  Surgeon: Lelon Perla, MD;  Location: [facility] ENDOSCOPY;  Service: Cardiovascular;  Laterality: N/A;  ? IR ANGIO EXTERNAL CAROTID SEL EXT CAROTID UNI R MOD SED  09/26/2020  ? IR ANGIO INTRA EXTRACRAN SEL COM CAROTID INNOMINATE BILAT MOD SED  09/26/2020  ? IR ANGIO VERTEBRAL SEL VERTEBRAL UNI R MOD SED  09/26/2020  ? IR IVC FILTER PLMT / S&I /IMG GUID/MOD SED  10/08/2020  ? IR IVUS EACH ADDITIONAL NON CORONARY VESSEL  11/02/2020  ? IR PTA VENOUS EXCEPT DIALYSIS CIRCUIT  11/02/2020  ? IR RADIOLOGIST EVAL & MGMT  01/17/2021  ? IR RADIOLOGIST EVAL & MGMT  09/12/2021  ? IR THROMBECT VENO MECH MOD SED  10/27/2020  ? IR THROMBECT VENO MECH MOD SED  11/02/2020  ? IR US GUIDE VASC ACCESS LEFT  10/27/2020  ?  IR US GUIDE VASC ACCESS RIGHT  09/26/2020  ? IR US GUIDE VASC ACCESS RIGHT  11/02/2020  ? IR VENO/EXT/UNI RIGHT  11/02/2020  ? IR VENOCAVAGRAM IVC  10/27/2020  ? RETINAL DETACHMENT SURGERY    ? right knee arthroscopy    ? TEE WITHOUT CARDIOVERSION N/A 09/27/2013  ? Procedure: TRANSESOPHAGEAL ECHOCARDIOGRAM (TEE);  Surgeon: Lelon Perla, MD;  Location: [place];  Service: Cardiovascular;  Laterality: N/A;  ? WRIST SURGERY    ? ?Patient Active Problem List  ? Diagnosis Date Noted  ? Hypertension 08/16/2021  ? 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 Residual cognitive deficit as late effect of stroke 04/19/2021  ? Hemiparesis affecting right side as late effect of stroke ([place]) 04/19/2021  ? 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 ([place]) 04/19/2021  ? Thrombosis   ? Sleep disturbance   ? Dysphagia, post-stroke   ? PAF (paroxysmal atrial fibrillation) ([place])   ? Acute pulmonary embolism without acute cor pulmonale (HCC)   ? Malnutrition of moderate degree 11/04/2020  ? Tracheostomy care [facility])   ? Pressure injury of skin 10/23/2020  ? Intracranial hemorrhage ([place])   ? Atrial fibrillation ([place])   ? Prediabetes   ? Leukocytosis   ?  Acute blood loss anemia   ? Hypernatremia   ? ICH (intracerebral hemorrhage) ([place]) 09/25/2020  ? Unilateral primary osteoarthritis, left knee 09/26/2016  ? Atrial flutter ([place]) 09/25/2013  ? Near syncope 09/25/2013  ? ? ?ONSET DATE: 08/29/2021 ( CVA was 09/25/20) ?  ?REFERRING DIAG: I62.9 (ICD-10-CM) - Intracranial hemorrhage  ? ?THERAPY DIAG:  ?Other abnormalities of gait and mobility ? ?Other lack of coordination ? ?Muscle weakness (generalized) ? ?Unsteadiness on feet ? ?PERTINENT HISTORY: atrial flutter s/p ablation, DVT with IVC filter, near syncope, CVA- Brainstem bleed involving the left midbrain, pons as well as left thalamus. 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 ?PRECAUTIONS: Fall  ? ?SUBJECTIVE: Patient reports no new changes/complaint since last PT visit. No falls to report. Upon standing in waiting area, patient having instance of catching L Toe and losing balance but able to regain with use of RW. Reports that exercises went well last session.  ? ?PAIN:  ?Are you having pain? No ? ?  ? ?TODAY'S TREATMENT:  ? GAIT: ?Gait pattern: step through pattern, decreased step length- Right, decreased step length- Left, ataxic, and trunk flexed ?Distance walked: ambulation throughout clinic with RW ?Assistive device utilized: Environmental consultant - 2 wheeled and gait belt ?Level of assistance: CGA and Min A ?Comments: Cues to slow gait speed for improved control, as well as proper use of AD with turns. ? ?TherEx: ?Completed NuStep with BLE/BUE on level 5 x 7 minutes for endurance and UE/LE coordination. Cues for control with movement and to maintain steady pace, approx 65-75 steps/minutes. Patient tolerating well with cues. Vitals stable with activity. BP after completion 138/86 ? ?NMR:  ?Per Patient Request reviewed entire HEP provided at prior session, able to complete with minimal cues:  ? ?Access Code: [email or web address] ?URL: [email or web address] ?Date: 09/19/2021 ?Prepared by: Elease Etienne ?  ?Exercises ?Supine Bridge - 1 x daily - 5 x weekly - 2 sets - 8 reps ?Supine 90/90 Alternating Toe Touch - 1 x daily - 5 x weekly - 2 sets - 10 reps ?Supine Single Leg Extensions - 1 x daily - 5 x weekly - 2 sets - 10 reps ?Sit to Stand with Hands on Knees - 1 x daily - 5 x weekly - 2 sets - 10 reps-Used mirror for visual feedback with improved symmetrical LE weight bearing, cued at shoulders for upright trunk ?  ?Supine on Mat:  ?- Completed alternating lower trunk rotation for core strength and coordination working on slow/control rotation, completed x 10 reps bilat.  ? - Supine completed alternating UE/LE raises x 10 reps bilat, with cues for control, cues to  avoid holding breath and completing breathing with activity.  ? ?Seated on [employer] Disc:  ?- Completed alternating diagonal trunk rotations x 10 reps to each direction with use of 3# weighted ball. Cues for upright posture with completion.  ? ?PATIENT EDUCATION: ?Education details: Continue HEP ?Person educated: Patient ?Education method: Explanation ?Education comprehension: verbalized understanding ?  ?  ?HOME EXERCISE PROGRAM: ? Access Code: [email or web address] ?  ?ASSESSMENT: ?  ?CLINICAL IMPRESSION: ?Continued today's skilled PT session focused on activities to promote improved core activation for /proximal stability and coordination. Patient tolerating activities well, intermittent cues for posture and pacing. Will continue per POC.   ?  ?  ?OBJECTIVE IMPAIRMENTS Abnormal gait, decreased activity tolerance, decreased balance, decreased cognition, decreased coordination, decreased endurance, decreased knowledge of use of DME, decreased  mobility, decreased strength, impaired sensation, impaired UE functional use, and impaired vision/preception.  ?  ?ACTIVITY LIMITATIONS cleaning, community activity, and yard work.  ?  ?PERSONAL FACTORS Time since onset of injury/illness/exacerbation and 1-2 comorbidities: atrial flutter s/p ablation, DVT with IVC filter,  are also affecting patient's functional outcome.  ?  ?  ?REHAB POTENTIAL: Fair   ?  ?CLINICAL DECISION MAKING: Evolving/moderate complexity ?  ?EVALUATION COMPLEXITY: Moderate ?  ?  ?GOALS: ?Goals reviewed with patient? Yes ?  ?SHORT TERM GOALS: ?  ?Pt will be able to perform initial HEP for core stabilization, balance to continue gains on own with supervision of family.  ?Baseline:  ?Target date: 10/04/2021 ?Goal status: INITIAL ?  ?2.  Pt will be able to ambulate up/down curb with FWW supervision to be able to safely enter new home with 2 steps to enter. ?Baseline:  ?Target date: 10/04/2021 ?Goal status: INITIAL ?  ?3.  Merrilee Jansky will be performed and LTG updated ?Baseline:  Assessed 09/19/2021 ?Target date: 10/04/2021 ?Goal status: INITIAL ?  ?  ?  ?LONG TERM GOALS: ?  ?Pt will be able to perform progressive HEP for balance, core strengthening and aerobic activity to continue ga

## 2021-09-27 ENCOUNTER — Other Ambulatory Visit: Payer: Self-pay | Admitting: Radiology

## 2021-09-28 ENCOUNTER — Ambulatory Visit (HOSPITAL_COMMUNITY)
Admission: RE | Admit: 2021-09-28 | Discharge: 2021-09-28 | Disposition: A | Discharge status: Home / Self Care | Payer: Medicare Other | Source: Ambulatory Visit | Attending: Interventional Radiology | Admitting: Interventional Radiology

## 2021-09-28 ENCOUNTER — Other Ambulatory Visit: Payer: Self-pay

## 2021-09-28 ENCOUNTER — Encounter (HOSPITAL_COMMUNITY): Payer: Self-pay

## 2021-09-28 DIAGNOSIS — Z4589 Encounter for adjustment and management of other implanted devices: Secondary | ICD-10-CM | POA: Insufficient documentation

## 2021-09-28 DIAGNOSIS — Z95828 Presence of other vascular implants and grafts: Secondary | ICD-10-CM

## 2021-09-28 DIAGNOSIS — Z86718 Personal history of other venous thrombosis and embolism: Secondary | ICD-10-CM | POA: Diagnosis not present

## 2021-09-28 DIAGNOSIS — I480 Paroxysmal atrial fibrillation: Secondary | ICD-10-CM | POA: Diagnosis not present

## 2021-09-28 DIAGNOSIS — I4892 Unspecified atrial flutter: Secondary | ICD-10-CM | POA: Diagnosis not present

## 2021-09-28 DIAGNOSIS — Z79899 Other long term (current) drug therapy: Secondary | ICD-10-CM | POA: Insufficient documentation

## 2021-09-28 DIAGNOSIS — Z8673 Personal history of transient ischemic attack (TIA), and cerebral infarction without residual deficits: Secondary | ICD-10-CM | POA: Insufficient documentation

## 2021-09-28 HISTORY — PX: IR IVC FILTER RETRIEVAL / S&I /IMG GUID/MOD SED: IMG5308

## 2021-09-28 LAB — BASIC METABOLIC PANEL
Anion gap: 9 (ref 5–15)
BUN: 10 mg/dL (ref 8–23)
CO2: 26 mmol/L (ref 22–32)
Calcium: 9.4 mg/dL (ref 8.9–10.3)
Chloride: 103 mmol/L (ref 98–111)
Creatinine, Ser: 0.91 mg/dL (ref 0.61–1.24)
GFR, Estimated: >60 mL/min (ref >60)
Glucose, Bld: 113 mg/dL — ABNORMAL HIGH (ref 70–99)
Potassium: 4.1 mmol/L (ref 3.5–5.1)
Sodium: 138 mmol/L (ref 135–145)

## 2021-09-28 LAB — CBC
HCT: 45.9 % (ref 39.0–52.0)
Hemoglobin: 15.3 g/dL (ref 13.0–17.0)
MCH: 31.4 pg (ref 26.0–34.0)
MCHC: 33.3 g/dL (ref 30.0–36.0)
MCV: 94.3 fL (ref 80.0–100.0)
Platelets: 216 10*3/uL (ref 150–400)
RBC: 4.87 MIL/uL (ref 4.22–5.81)
RDW: 13.1 % (ref 11.5–15.5)
WBC: 5.3 10*3/uL (ref 4.0–10.5)
nRBC: 0 % (ref 0.0–0.2)

## 2021-09-28 MED ORDER — MIDAZOLAM HCL 2 MG/2ML IJ SOLN
INTRAMUSCULAR | Status: AC
  Filled 2021-09-28: qty 2

## 2021-09-28 MED ORDER — IOHEXOL 300 MG/ML  SOLN
100.0000 mL | Freq: Once | INTRAMUSCULAR | Status: AC | PRN
  Administered 2021-09-28: 50 mL via INTRAVENOUS

## 2021-09-28 MED ORDER — MIDAZOLAM HCL 2 MG/2ML IJ SOLN
INTRAMUSCULAR | Status: AC | PRN
  Administered 2021-09-28: .5 mg via INTRAVENOUS
  Administered 2021-09-28: 1 mg via INTRAVENOUS
  Administered 2021-09-28: .5 mg via INTRAVENOUS

## 2021-09-28 MED ORDER — SODIUM CHLORIDE 0.9 % IV SOLN
INTRAVENOUS | Status: DC
Start: 2021-09-28 — End: 2021-09-29

## 2021-09-28 MED ORDER — LIDOCAINE-EPINEPHRINE 1 %-1:100000 IJ SOLN
INTRAMUSCULAR | Status: AC
  Filled 2021-09-28: qty 1

## 2021-09-28 MED ORDER — LIDOCAINE-EPINEPHRINE 1 %-1:100000 IJ SOLN
INTRAMUSCULAR | Status: AC | PRN
  Administered 2021-09-28: 10 mL

## 2021-09-28 MED ORDER — FENTANYL CITRATE (PF) 100 MCG/2ML IJ SOLN
INTRAMUSCULAR | Status: AC
  Filled 2021-09-28: qty 2

## 2021-09-28 MED ORDER — FENTANYL CITRATE (PF) 100 MCG/2ML IJ SOLN
INTRAMUSCULAR | Status: AC | PRN
  Administered 2021-09-28: 25 ug via INTRAVENOUS
  Administered 2021-09-28: 12.5 ug via INTRAVENOUS

## 2021-09-28 NOTE — H&P (Signed)
? ?Chief Complaint: ?Patient was seen in consultation today for inferior vena cava filter retrieval   ? ?Referring Physician(s): ?Suttle,Dylan J ? ?Supervising Physician: Marliss Coots ? ?Patient Status: [facility] - Out-pt ? ?History of Present Illness: ?Walter [name] is a 66 y.o. male  ? ?IVC filter placed in IR 10/08/20 ?Hx CVA; Bilat Lowe extr DVT; post suction thrombectomy 10/27/20;repeat thrombectomy 11/02/20 ? ?Has been seen in consultation with Dr Algis Downs Elby Showers regarding IVC filter retrieval ?Ambulating well with walker ? ?3/15./23 note:  History of hemorrhagic stroke status post IVC filter placement on 10/08/2020 with subsequent propagation of provoked, right greater than left lower extremity deep vein thrombosis with ileocaval involvement and near occlusion of the inferior vena cava. Status post suction thrombectomy on 10/27/2020 with inadequate clearance of iliocaval thrombus followed by repeat mechanical thrombectomy on 11/02/2020.   Remarkably, Mr. [name] now has complete resolution of iliocaval thrombus and right femoral DVT on imaging and no clinical evidence of post-thrombotic syndrome. ?-recommend cessation of Eliquis as provoked DVT has resolved entirely s/p nearly 10 months of anticoagulation ?-will arrange for IVC filter retrieval at [facility] or [facility] with moderate sedation ? ?Scheduled now for IVC filter retrieval per Dr Elby Showers ? ?Past Medical History:  ?Diagnosis Date  ? Atrial flutter (HCC)   ? s/p ablation  ? DVT (deep venous thrombosis) (HCC)   ? Near syncope 09/25/2013  ? Paroxysmal atrial fibrillation (HCC)   ? Stroke [facility])   ? initially hemorragic (not on OAC) followed by subsequent embolic stroke  ? ? ?Past Surgical History:  ?Procedure Laterality Date  ? A-FLUTTER ABLATION N/A 03/09/2020  ? Procedure: A-FLUTTER ABLATION;  Surgeon: Hillis Range, MD;  Location: [facility] INVASIVE CV LAB;  Service: Cardiovascular;  Laterality: N/A;  ? CARDIOVERSION N/A 09/27/2013  ? Procedure: CARDIOVERSION;  Surgeon: Lewayne Bunting, MD;  Location: [facility] ENDOSCOPY;  Service: Cardiovascular;  Laterality: N/A;  ? IR ANGIO EXTERNAL CAROTID SEL EXT CAROTID UNI R MOD SED  09/26/2020  ? IR ANGIO INTRA EXTRACRAN SEL COM CAROTID INNOMINATE BILAT MOD SED  09/26/2020  ? IR ANGIO VERTEBRAL SEL VERTEBRAL UNI R MOD SED  09/26/2020  ? IR IVC FILTER PLMT / S&I /IMG GUID/MOD SED  10/08/2020  ? IR IVUS EACH ADDITIONAL NON CORONARY VESSEL  11/02/2020  ? IR PTA VENOUS EXCEPT DIALYSIS CIRCUIT  11/02/2020  ? IR RADIOLOGIST EVAL & MGMT  01/17/2021  ? IR RADIOLOGIST EVAL & MGMT  09/12/2021  ? IR THROMBECT VENO MECH MOD SED  10/27/2020  ? IR THROMBECT VENO MECH MOD SED  11/02/2020  ? IR US GUIDE VASC ACCESS LEFT  10/27/2020  ? IR US GUIDE VASC ACCESS RIGHT  09/26/2020  ? IR US GUIDE VASC ACCESS RIGHT  11/02/2020  ? IR VENO/EXT/UNI RIGHT  11/02/2020  ? IR VENOCAVAGRAM IVC  10/27/2020  ? RETINAL DETACHMENT SURGERY    ? right knee arthroscopy    ? TEE WITHOUT CARDIOVERSION N/A 09/27/2013  ? Procedure: TRANSESOPHAGEAL ECHOCARDIOGRAM (TEE);  Surgeon: Lewayne Bunting, MD;  Location: [facility] ENDOSCOPY;  Service: Cardiovascular;  Laterality: N/A;  ? WRIST SURGERY    ? ? ?Allergies: ?Keppra [levetiracetam] and Latex ? ?Medications: ?Prior to Admission medications   ?Medication Sig Start Date End Date Taking? Authorizing Provider  ?atorvastatin (LIPITOR) 20 MG tablet Take 1 tablet (20 mg total) by mouth at bedtime. 08/16/21  Yes Marinus Maw, MD  ?diltiazem (CARDIZEM CD) 120 MG 24 hr capsule Take 1 capsule (120 mg total) by mouth daily. 08/16/21  11/14/21 Yes Marinus Maw, MD  ?gabapentin (NEURONTIN) 100 MG capsule Take 1 capsule (100 mg total) by mouth 3 (three) times daily. 08/07/21  Yes Micki Riley, MD  ?mirtazapine (REMERON) 7.5 MG tablet TAKE 2 TABLETS BY MOUTH AT BEDTIME 06/05/21  Yes Micki Riley, MD  ?sertraline (ZOLOFT) 100 MG tablet Take 100 mg by mouth daily. 01/31/21  Yes [provider]  ?EPINEPHrine 0.3 mg/0.3 mL IJ SOAJ injection Inject 0.3 mg into the muscle as needed  for anaphylaxis.    [provider]  ?  ? ?Family History  ?Problem Relation Age of Onset  ? Heart disease Mother   ? Heart disease Father   ? Healthy Sister   ? Healthy Brother   ? ? ?Social History  ? ?Socioeconomic History  ? Marital status: Married  ?  Spouse name: cathy  ? Number of children: Not on file  ? Years of education: Not on file  ? Highest education level: Not on file  ?Occupational History  ? Not on file  ?Tobacco Use  ? Smoking status: Never  ? Smokeless tobacco: Never  ?Vaping Use  ? Vaping Use: Never used  ?Substance and Sexual Activity  ? Alcohol use: Yes  ?  Comment: occasionally  ? Drug use: No  ? Sexual activity: Not on file  ?Other Topics Concern  ? Not on file  ?Social History Narrative  ? Lives with wife at home  ? Right Handed  ? Drinks decaf  ? ?Social Determinants of Health  ? ?Financial Resource Strain: Not on file  ?Food Insecurity: Not on file  ?Transportation Needs: Not on file  ?Physical Activity: Not on file  ?Stress: Not on file  ?Social Connections: Not on file  ? ? ?Review of Systems: A 12 point ROS discussed and pertinent positives are indicated in the HPI above.  All other systems are negative. ? ?Vital Signs: ?BP (!) 138/92   Pulse 76   Temp (!) 97.5 ?F (36.4 ?C) (Oral)   Resp 16   Ht 6' (1.829 m)   Wt 225 lb (102.1 kg)   SpO2 96%   BMI 30.52 kg/m?  ? ?Physical Exam ?Vitals reviewed.  ?HENT:  ?   Mouth/Throat:  ?   Mouth: Mucous membranes are moist.  ?Cardiovascular:  ?   Rate and Rhythm: Normal rate and regular rhythm.  ?   Heart sounds: Normal heart sounds.  ?Pulmonary:  ?   Effort: Pulmonary effort is normal.  ?   Breath sounds: Normal breath sounds.  ?Abdominal:  ?   Palpations: Abdomen is soft.  ?   Tenderness: There is no abdominal tenderness.  ?Musculoskeletal:     ?   General: Normal range of motion.  ?Skin: ?   General: Skin is warm.  ?Neurological:  ?   Mental Status: He is alert and oriented to person, place, and time.  ?Psychiatric:     ?    Behavior: Behavior normal.  ? ? ?Imaging: ?US Venous Img Lower Bilateral (DVT) ? ?Result Date: 09/12/2021 ?CLINICAL DATA:  66 year old male with history of extensive provoked deep vein thrombosis in the setting of hemorrhagic stroke and IVC filter placement. Follow-up study. EXAM: BILATERAL LOWER EXTREMITY VENOUS DOPPLER ULTRASOUND TECHNIQUE: Gray-scale sonography with graded compression, as well as color Doppler and duplex ultrasound were performed to evaluate the lower extremity deep venous systems from the level of the common femoral vein and including the common femoral, femoral, profunda femoral, popliteal and calf veins  including the posterior tibial, peroneal and gastrocnemius veins when visible. The superficial great saphenous vein was also interrogated. Spectral Doppler was utilized to evaluate flow at rest and with distal augmentation maneuvers in the common femoral, femoral and popliteal veins. COMPARISON:  01/17/2021 FINDINGS: RIGHT LOWER EXTREMITY Common Femoral Vein: No evidence of thrombus. Normal compressibility, respiratory phasicity and response to augmentation. Saphenofemoral Junction: No evidence of thrombus. Normal compressibility and flow on color Doppler imaging. Profunda Femoral Vein: No evidence of thrombus. Normal compressibility and flow on color Doppler imaging. Femoral Vein: No evidence of thrombus. Normal compressibility, respiratory phasicity and response to augmentation. Popliteal Vein: No evidence of thrombus. Normal compressibility, respiratory phasicity and response to augmentation. Calf Veins: No evidence of thrombus. Normal compressibility and flow on color Doppler imaging. Other Findings:  None. LEFT LOWER EXTREMITY Common Femoral Vein: No evidence of thrombus. Normal compressibility, respiratory phasicity and response to augmentation. Saphenofemoral Junction: No evidence of thrombus. Normal compressibility and flow on color Doppler imaging. Profunda Femoral Vein: No evidence of  thrombus. Normal compressibility and flow on color Doppler imaging. Femoral Vein: No evidence of thrombus. Normal compressibility, respiratory phasicity and response to augmentation. Popliteal Vein: No evidence of thro

## 2021-09-28 NOTE — Procedures (Signed)
Interventional Radiology Procedure Note ? ?Procedure: IVC filter retrieval ? ?Findings: Please refer to procedural dictation for full description. Successful retrieval from right IJ access. ? ?Complications: None immediate ? ?Estimated Blood Loss: < 5 mL ? ?Recommendations: ?Discharge after 1 hour bedrest, if stable. ?Follow up with IR as needed. ? ? ?Marliss Coots, MD ?Pager: [phone] ? ? ?

## 2021-10-03 ENCOUNTER — Ambulatory Visit: Payer: Medicare Other | Attending: Family Medicine | Admitting: Occupational Therapy

## 2021-10-03 ENCOUNTER — Ambulatory Visit: Payer: Medicare Other

## 2021-10-03 DIAGNOSIS — R270 Ataxia, unspecified: Secondary | ICD-10-CM | POA: Insufficient documentation

## 2021-10-03 DIAGNOSIS — M6281 Muscle weakness (generalized): Secondary | ICD-10-CM | POA: Diagnosis present

## 2021-10-03 DIAGNOSIS — R2681 Unsteadiness on feet: Secondary | ICD-10-CM | POA: Insufficient documentation

## 2021-10-03 DIAGNOSIS — R2689 Other abnormalities of gait and mobility: Secondary | ICD-10-CM

## 2021-10-03 DIAGNOSIS — R41844 Frontal lobe and executive function deficit: Secondary | ICD-10-CM | POA: Diagnosis present

## 2021-10-03 DIAGNOSIS — R278 Other lack of coordination: Secondary | ICD-10-CM

## 2021-10-03 DIAGNOSIS — R41842 Visuospatial deficit: Secondary | ICD-10-CM | POA: Insufficient documentation

## 2021-10-03 NOTE — Therapy (Signed)
?OUTPATIENT PHYSICAL THERAPY TREATMENT NOTE ? ? ?Patient Name: Walter [name] ?MRN: [id number] ?DOB:1955/10/26, 66 y.o., male ?Today's Date: 10/03/2021 ? ?PCP: Alroy Dust, L.Marlou Sa, MD ?REFERRING PROVIDER: Alroy Dust, L.Marlou Sa, MD ? ? PT End of Session - 10/03/21 1108   ? ? Visit Number 4   ? Number of Visits 9   ? Date for PT Re-Evaluation 11/02/21   ? Authorization Type Medicare so 10th visit progress note   ? PT Start Time 1102   ? PT Stop Time [id number]   ? PT Time Calculation (min) 41 min   ? Equipment Utilized During Treatment Gait belt   ? Activity Tolerance Patient tolerated treatment well   ? Behavior During Therapy Impulsive;WFL for tasks assessed/performed   ? ?  ?  ? ?  ? ? ? ?Past Medical History:  ?Diagnosis Date  ? Atrial flutter ([place])   ? s/p ablation  ? DVT (deep venous thrombosis) ([place])   ? Near syncope 09/25/2013  ? Paroxysmal atrial fibrillation (HCC)   ? Stroke [facility])   ? initially hemorragic (not on [place]) followed by subsequent embolic stroke  ? ?Past Surgical History:  ?Procedure Laterality Date  ? A-FLUTTER ABLATION N/A 03/09/2020  ? Procedure: A-FLUTTER ABLATION;  Surgeon: Thompson Grayer, MD;  Location: [place] CV LAB;  Service: Cardiovascular;  Laterality: N/A;  ? CARDIOVERSION N/A 09/27/2013  ? Procedure: CARDIOVERSION;  Surgeon: Lelon Perla, MD;  Location: [facility] ENDOSCOPY;  Service: Cardiovascular;  Laterality: N/A;  ? IR ANGIO EXTERNAL CAROTID SEL EXT CAROTID UNI R MOD SED  09/26/2020  ? IR ANGIO INTRA EXTRACRAN SEL COM CAROTID INNOMINATE BILAT MOD SED  09/26/2020  ? IR ANGIO VERTEBRAL SEL VERTEBRAL UNI R MOD SED  09/26/2020  ? IR IVC FILTER PLMT / S&I /IMG GUID/MOD SED  10/08/2020  ? IR IVC FILTER RETRIEVAL / S&I /IMG GUID/MOD SED  09/28/2021  ? IR IVUS EACH ADDITIONAL NON CORONARY VESSEL  11/02/2020  ? IR PTA VENOUS EXCEPT DIALYSIS CIRCUIT  11/02/2020  ? IR RADIOLOGIST EVAL & MGMT  01/17/2021  ? IR RADIOLOGIST EVAL & MGMT  09/12/2021  ? IR THROMBECT VENO MECH MOD SED  10/27/2020  ? IR THROMBECT VENO MECH MOD SED   11/02/2020  ? IR US GUIDE VASC ACCESS LEFT  10/27/2020  ? IR US GUIDE VASC ACCESS RIGHT  09/26/2020  ? IR US GUIDE VASC ACCESS RIGHT  11/02/2020  ? IR VENO/EXT/UNI RIGHT  11/02/2020  ? IR VENOCAVAGRAM IVC  10/27/2020  ? RETINAL DETACHMENT SURGERY    ? right knee arthroscopy    ? TEE WITHOUT CARDIOVERSION N/A 09/27/2013  ? Procedure: TRANSESOPHAGEAL ECHOCARDIOGRAM (TEE);  Surgeon: Lelon Perla, MD;  Location: [place];  Service: Cardiovascular;  Laterality: N/A;  ? WRIST SURGERY    ? ?Patient Active Problem List  ? Diagnosis Date Noted  ? Hypertension 08/16/2021  ? 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 Residual cognitive deficit as late effect of stroke 04/19/2021  ? Hemiparesis affecting right side as late effect of stroke ([place]) 04/19/2021  ? 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 ([place]) 04/19/2021  ? Thrombosis   ? Sleep disturbance   ? Dysphagia, post-stroke   ? PAF (paroxysmal atrial fibrillation) ([place])   ? Acute pulmonary embolism without acute cor pulmonale (HCC)   ? Malnutrition of moderate degree 11/04/2020  ? Tracheostomy care [facility])   ? Pressure injury of skin 10/23/2020  ? Intracranial hemorrhage ([place])   ? Atrial  fibrillation ([place])   ? Prediabetes   ? Leukocytosis   ? Acute blood loss anemia   ? Hypernatremia   ? ICH (intracerebral hemorrhage) ([place]) 09/25/2020  ? Unilateral primary osteoarthritis, left knee 09/26/2016  ? Atrial flutter ([place]) 09/25/2013  ? Near syncope 09/25/2013  ? ? ?ONSET DATE: 08/29/2021 (CVA was 09/25/20) ?  ?REFERRING DIAG: I62.9 (ICD-10-CM) - Intracranial hemorrhage  ? ?THERAPY DIAG:  ?Other abnormalities of gait and mobility ? ?Muscle weakness (generalized) ? ?Unsteadiness on feet ? ?Other lack of coordination ? ?PERTINENT HISTORY: atrial flutter s/p ablation, DVT with IVC filter, near syncope, CVA- Brainstem bleed involving the left midbrain, pons as well as left thalamus. 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 ?PRECAUTIONS: Fall  ? ?SUBJECTIVE: Patient reports that he decided to take himself off Gabapentin due to one of the side effects being double vision. No other new changes/complaints. No falls.  ? ?PAIN:  ?Are you having pain? No ? ?  ? ?TODAY'S TREATMENT:  ? GAIT: ?Gait pattern: step through pattern, decreased step length- Right, decreased step length- Left, ataxic, and trunk flexed ?Distance walked: ambulation throughout clinic with RW ?Assistive device utilized: Environmental consultant - 2 wheeled and gait belt ?Level of assistance: CGA and Min A ?Comments: Cues to slow gait speed for improved control, as well as proper use of AD with turns. ? ?TherEx: ?Completed SciFit with BLE/BUE on level 3.5 x 8 minutes for endurance and UE/LE coordination. Cues for control with movement and to maintain steady pace, approx 65-75 steps/minutes. ? ?NMR: ?Quadruped on Mat: Pt require CGA to get into quadruped position with cues for abdominal engagement completed alternating arm raises x 10 reps bilat, working on lateral weight shift. Then transitioned to [facility] through [place] and working on hip flexion on RLE to weight shift to LLE x 10 reps. Cues for proper technique. Then transitioned to complete anterior posterior trunk movement rocking forward on BUE and then back onto BLE, working on slow controlled movement for proximal stability.  ?Supine in 90/90 with core activation, completed alternating BLE extension without touching mat 2 sets x 10 reps with focus on control to improve coordination and reciprocal movement.  ?Seated on [facility] [employer] Disc: completed alternating UE/LE flexion with opposite UE/LE to work on coordination, completed x 10 reps bilaterally. PT providing tactile cues for abdominal activation and upright posture, due to lateral lean especially with L Hip Flexion.  ? ? ?PATIENT EDUCATION: ?Education details: Continue HEP; Follow up MD regarding other  options to replace Gabapentin, to take medications as prescribed ?Person educated: Patient ?Education method: Explanation ?Education comprehension: verbalized understanding ?  ?  ?HOME EXERCISE PROGRAM: ? Access Code: [email or web address] ?  ?ASSESSMENT: ?  ?CLINICAL IMPRESSION: ?Continued today's skilled PT session focused on activities working on improved core stabilization/activation, reciprocal movement and coordination with activities. Patient tolerating well. Requiring intermittent CGA due to impulsiveness. Will continue per POC.  ?  ?  ?OBJECTIVE IMPAIRMENTS Abnormal gait, decreased activity tolerance, decreased balance, decreased cognition, decreased coordination, decreased endurance, decreased knowledge of use of DME, decreased mobility, decreased strength, impaired sensation, impaired UE functional use, and impaired vision/preception.  ?  ?ACTIVITY LIMITATIONS cleaning, community activity, and yard work.  ?  ?PERSONAL FACTORS Time since onset of injury/illness/exacerbation and 1-2 comorbidities: atrial flutter s/p ablation, DVT with IVC filter,  are also affecting patient's functional outcome.  ?  ?  ?REHAB POTENTIAL: Fair   ?  ?  CLINICAL DECISION MAKING: Evolving/moderate complexity ?  ?EVALUATION COMPLEXITY: Moderate ?  ?  ?GOALS: ?Goals reviewed with patient? Yes ?  ?SHORT TERM GOALS: ?  ?Pt will be able to perform initial HEP for core stabilization, balance to continue gains on own with supervision of family.  ?Baseline:  ?Target date: 10/04/2021 ?Goal status: INITIAL ?  ?2.  Pt will be able to ambulate up/down curb with FWW supervision to be able to safely enter new home with 2 steps to enter. ?Baseline:  ?Target date: 10/04/2021 ?Goal status: INITIAL ?  ?3.  Merrilee Jansky will be performed and LTG updated ?Baseline: Assessed 09/19/2021 ?Target date: 10/04/2021 ?Goal status: INITIAL ?  ?  ?  ?LONG TERM GOALS: ?  ?Pt will be able to perform progressive HEP for balance, core strengthening and aerobic activity to continue gains on  own with assistance of family/caregiver. (LTGs due 11/02/21) ?  ?Baseline:  ?Target date:  11/02/21 ?Goal status: INITIAL ?  ?2.  Pt will increase gait speed from 0.50m/s to >0.71m/s for improved community mo

## 2021-10-03 NOTE — Therapy (Signed)
?OUTPATIENT OCCUPATIONAL THERAPY TREATMENT NOTE ? ? ?Patient Name: Walter [name] ?MRN: [id number] ?DOB:1955-11-14, 66 y.o., male ?Today's Date: 10/03/2021 ? ?PCP: Alroy Dust, L.Marlou Sa, MD ?REFERRING PROVIDER: Alroy Dust, L.Marlou Sa, MD ? ? OT End of Session - 10/03/21 1304   ? ? Visit Number 4   ? Number of Visits 25   ? Date for OT Re-Evaluation 11/28/21   ? Authorization Type Medicare Part A & B   ? Authorization Time Period 14   ? Progress Note Due on Visit 10   ? OT Start Time 1145   ? OT Stop Time 1225   ? OT Time Calculation (min) 40 min   ? Activity Tolerance Patient tolerated treatment well   ? Behavior During Therapy [facility] for tasks assessed/performed   ? ?  ?  ? ?  ? ? ?Past Medical History:  ?Diagnosis Date  ? Atrial flutter ([place])   ? s/p ablation  ? DVT (deep venous thrombosis) ([place])   ? Near syncope 09/25/2013  ? Paroxysmal atrial fibrillation (HCC)   ? Stroke [facility])   ? initially hemorragic (not on [place]) followed by subsequent embolic stroke  ? ?Past Surgical History:  ?Procedure Laterality Date  ? A-FLUTTER ABLATION N/A 03/09/2020  ? Procedure: A-FLUTTER ABLATION;  Surgeon: Thompson Grayer, MD;  Location: [facility] CV LAB;  Service: Cardiovascular;  Laterality: N/A;  ? CARDIOVERSION N/A 09/27/2013  ? Procedure: CARDIOVERSION;  Surgeon: Lelon Perla, MD;  Location: [facility] ENDOSCOPY;  Service: Cardiovascular;  Laterality: N/A;  ? IR ANGIO EXTERNAL CAROTID SEL EXT CAROTID UNI R MOD SED  09/26/2020  ? IR ANGIO INTRA EXTRACRAN SEL COM CAROTID INNOMINATE BILAT MOD SED  09/26/2020  ? IR ANGIO VERTEBRAL SEL VERTEBRAL UNI R MOD SED  09/26/2020  ? IR IVC FILTER PLMT / S&I /IMG GUID/MOD SED  10/08/2020  ? IR IVC FILTER RETRIEVAL / S&I /IMG GUID/MOD SED  09/28/2021  ? IR IVUS EACH ADDITIONAL NON CORONARY VESSEL  11/02/2020  ? IR PTA VENOUS EXCEPT DIALYSIS CIRCUIT  11/02/2020  ? IR RADIOLOGIST EVAL & MGMT  01/17/2021  ? IR RADIOLOGIST EVAL & MGMT  09/12/2021  ? IR THROMBECT VENO MECH MOD SED  10/27/2020  ? IR THROMBECT VENO MECH MOD SED   11/02/2020  ? IR US GUIDE VASC ACCESS LEFT  10/27/2020  ? IR US GUIDE VASC ACCESS RIGHT  09/26/2020  ? IR US GUIDE VASC ACCESS RIGHT  11/02/2020  ? IR VENO/EXT/UNI RIGHT  11/02/2020  ? IR VENOCAVAGRAM IVC  10/27/2020  ? RETINAL DETACHMENT SURGERY    ? right knee arthroscopy    ? TEE WITHOUT CARDIOVERSION N/A 09/27/2013  ? Procedure: TRANSESOPHAGEAL ECHOCARDIOGRAM (TEE);  Surgeon: Lelon Perla, MD;  Location: [place];  Service: Cardiovascular;  Laterality: N/A;  ? WRIST SURGERY    ? ?Patient Active Problem List  ? Diagnosis Date Noted  ? Hypertension 08/16/2021  ? 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 Residual cognitive deficit as late effect of stroke 04/19/2021  ? Hemiparesis affecting right side as late effect of stroke ([place]) 04/19/2021  ? 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 ([place]) 04/19/2021  ? Thrombosis   ? Sleep disturbance   ? Dysphagia, post-stroke   ? PAF (paroxysmal atrial fibrillation) ([place])   ? Acute pulmonary embolism without acute cor pulmonale (HCC)   ? Malnutrition of moderate degree 11/04/2020  ? Tracheostomy [facility])   ? Pressure injury of skin 10/23/2020  ? Intracranial hemorrhage (  [place])   ? Atrial fibrillation ([place])   ? Prediabetes   ? Leukocytosis   ? Acute blood loss anemia   ? Hypernatremia   ? ICH (intracerebral hemorrhage) ([place]) 09/25/2020  ? Unilateral primary osteoarthritis, left knee 09/26/2016  ? Atrial flutter ([place]) 09/25/2013  ? Near syncope 09/25/2013  ? ? ?ONSET DATE: 09/17/20 ? ?REFERRING DIAG: I62.9 (ICD-10-CM) - Intracranial hemorrhage ([place]) ? ?THERAPY DIAG:  ?Other lack of coordination ? ?Muscle weakness (generalized) ? ?Unsteadiness on feet ? ?Visuospatial deficit ? ?Frontal lobe and executive function deficit ? ?Ataxia ? ? ?PERTINENT HISTORY: ICH of brainstem (complicated by hydrocephalus, DVTs, and tracheostomy).  PMH:  a-fib  ? ?PRECAUTIONS: fall risk, ataxia, impulsivity, diplopia   ? ? ?SUBJECTIVE: Pt reports weekend was good. Denies any pain.  ? ?PAIN:  ?Are you having pain? No ? ? ? ? ?OBJECTIVE:  ? ?TODAY'S TREATMENT:  ? ?10/03/21:  Assessed patient for visual midline shift - patient with accurate awareness in seated position of left/right/midline.  However with slightly inferior inclination with vertical testing.  In standing initially - patient with strong posterior bias - legs braced against the mat table, and shoulders well behind hips.  In standing in free space - using posture grid - took photos of patient's posture and allowed him to make observations.  Patient able to identify postural asymmetries and made attempts to self correct.   ?Discussed plan for aquatic therapy.  Patient wil lstart OT aquatics on Monday as per OT plan of care.   ? ? ?09/24/21 ? ?Weight bearing in LUE for inhibition of tone and facilitation of muscle activation and increased proprioception and decreased ataxia while reaching with RUE to place 1 inch blocks. Reversed and did opposite. Pt required supervision assistance for maintaining hand position on mat for weight bearing and SBA For stability while standing at mat.  ? ?Medium Pegs with LUE with cueing for strategies for decreased ataxia. Pt copied pattern with 100% Accuracy.  ? ?Visual Scanning with word search with increased time req'd and good scanning strategy. Pt req'd min/mod cues for harder words/directions for word search. ? ? ? ? OT Short Term Goals - 09/19/21 1424   ? ?  ? OT SHORT TERM GOAL #1  ? Title Pt will be independent with HEP for LUE coordination   ? Time 4   ? Period Weeks   ? Status On-going   reviewed coordination HEP 09/19/21  ? Target Date 10/03/21   ?  ? OT SHORT TERM GOAL #2  ? Title Pt will verbalize understanding of sensory strategies for RUE sensory deficits.   ? Time 4   ? Period Weeks   ? Status Achieved pt reports testing with LUE first for temperature. 09/24/21  ?  ? OT SHORT TERM GOAL #3  ? Title Pt will improve LUE  coordination for ADLs as shown by improving score on box and blocks test by at least 6.   ? Baseline L 18 R 39   ? Time 4   ? Period Weeks   ? Status On-going  Continue to work on coordination  ? ?  ?  ? ?  ? ? ? OT Long Term Goals - 09/05/21 1459   ? ?  ? OT LONG TERM GOAL #1  ? Title Pt will be independent with Aquatics HEP PRN   ? Time 12   ? Period Weeks   ? Status New   ? Target Date 11/28/21   ?  ?  OT LONG TERM GOAL #2  ? Title Pt will improve LUE hand coordination for ADLs as shown by completing 9-hole peg test in less than 90 seconds   ? Baseline 5 pegs in 2 minutes with several drops and knocking pegs out   ? Time 12   ? Period Weeks   ? Status Ongoing  Working on coordination  ?  ? OT LONG TERM GOAL #3  ? Title Pt will verbalize understanding of visual compensation strategies and functional scanning strategies for increasing safety with ADLs and IADLs.   ? Time 12   ? Period Weeks   ? Status Ongoing  Continue progress  ?  ? OT LONG TERM GOAL #4  ? Title Pt will perform environmental scanning with 90% accuracy for increased safety.  ?Time 12   ?Period Weeks   ?Status Ongoing  ?  ?  ?  ? OT LONG TERM GOAL #5  ? Title Pt will demo good safety awareness/compensation for sensory deficits and attention to LUE during functional tasks and transfers without cueing.   ? Time 12   ? Period Weeks   ? Status Ongoing  ?  ? OT LONG TERM GOAL #6  ? Title Pt will report increased ease and accuracy with handwriting and texting with visual strategies and compensations as needed.   ? Time 12   ? Period Weeks   ? Status Ongoing   ? ?  ?  ? ?  ? ? ? Plan - 09/24/21 1354   ? ? Clinical Impression Statement Pt continues to demonstrate significant ataxia in LUE impeding overall coordination for daily activities. Pt is also attending vision therapy that is beneficial for his diplopia.   ? OT Occupational Profile and History Detailed Assessment- Review of Records and additional review of physical, cognitive, psychosocial history  related to current functional performance   ? Occupational performance deficits (Please refer to evaluation for details): ADL's;IADL's;Work;Leisure;Social Participation   ? Body Structure / Function / Physical Skills ADL;Strength;[place]

## 2021-10-04 NOTE — Progress Notes (Deleted)
?   [facility] referral resutls (vascular specialist) ?IVC filter removed 09/28/2021 ? ? ? ?

## 2021-10-05 ENCOUNTER — Encounter: Payer: Self-pay | Admitting: *Deleted

## 2021-10-05 ENCOUNTER — Encounter: Payer: Self-pay | Admitting: Cardiology

## 2021-10-05 ENCOUNTER — Ambulatory Visit (INDEPENDENT_AMBULATORY_CARE_PROVIDER_SITE_OTHER): Payer: Medicare Other | Admitting: Cardiology

## 2021-10-05 VITALS — BP 128/78 | HR 86 | Ht 72.0 in | Wt 229.6 lb

## 2021-10-05 DIAGNOSIS — Z01818 Encounter for other preprocedural examination: Secondary | ICD-10-CM | POA: Diagnosis not present

## 2021-10-05 DIAGNOSIS — I480 Paroxysmal atrial fibrillation: Secondary | ICD-10-CM | POA: Diagnosis not present

## 2021-10-05 DIAGNOSIS — I639 Cerebral infarction, unspecified: Secondary | ICD-10-CM | POA: Diagnosis not present

## 2021-10-05 DIAGNOSIS — I10 Essential (primary) hypertension: Secondary | ICD-10-CM

## 2021-10-05 NOTE — Patient Instructions (Addendum)
Medication Instructions:  ?Your physician recommends that you continue on your current medications as directed. Please refer to the Current Medication list given to you today. ?*If you need a refill on your cardiac medications before your next appointment, please call your pharmacy* ? ?Lab Work: ?None. ?If you have labs (blood work) drawn today and your tests are completely normal, you will receive your results only by: ?MyChart Message (if you have MyChart) OR ?A paper copy in the mail ?If you have any lab test that is abnormal or we need to change your treatment, we will call you to review the results. ? ?Testing/Procedures: ?Your physician has requested that you have an echocardiogram. Echocardiography is a painless test that uses sound waves to create images of your heart. It provides your doctor with information about the size and shape of your heart and how well your heart?s chambers and valves are working. This procedure takes approximately one hour. There are no restrictions for this procedure. ? ?Your physician has requested that you have cardiac CT. Cardiac computed tomography (CT) is a painless test that uses an x-ray machine to take clear, detailed pictures of your heart. For further information please visit [email or web address]. Please follow instruction sheet as given. ? ?Follow-Up: ?At [facility], you and your health needs are our priority.  As part of our continuing mission to provide you with exceptional heart care, we have created designated Provider Care Teams.  These Care Teams include your primary Cardiologist (physician) and Advanced Practice Providers (APPs -  Physician Assistants and Nurse Practitioners) who all work together to provide you with the care you need, when you need it. ? ?Your physician wants you to follow-up in: Lenice Llamas, the [facility] Nurse Navigator, will call you after your CT once the [facility] Team has reviewed your imaging for an update on proceedings. [place]'s direct  number is [phone] if you need assistance.  ? ?We recommend signing up for the patient portal called "MyChart".  Sign up information is provided on this After Visit Summary.  MyChart is used to connect with patients for Virtual Visits (Telemedicine).  Patients are able to view lab/test results, encounter notes, upcoming appointments, etc.  Non-urgent messages can be sent to your provider as well.   ?To learn more about what you can do with MyChart, go to [email or web address].   ? ?Any Other Special Instructions Will Be Listed Below (If Applicable). ? ?Left Atrial Appendage Closure Device Implantation ?Left atrial appendage (LAA) closure device implantation is a procedure to put a small device in the LAA of the heart. The LAA is a small sac in the wall of the heart's left upper chamber. Blood clots can form in the LAA in people with atrial fibrillation (AFib). The device closes the LAA to help prevent a blood clot and stroke. ?AFib is a type of irregular or rapid heartbeat (arrhythmia). There is an increased risk of blood clots and stroke with AFib. This procedure helps to reduce that risk. ?Tell a health care provider about: ?Any allergies you have. ?All medicines you are taking, including vitamins, herbs, eye drops, creams, and over-the-counter medicines. ?Any problems you or family members have had with anesthetic medicines. ?Any blood disorders you have. ?Any surgeries you have had. ?Any medical conditions you have. ?Whether you are pregnant or may be pregnant. ?What are the risks? ?Generally, this is a safe procedure. However, problems may occur, including: ?Infection. ?Bleeding. ?Allergic reactions to medicines or dyes. ?Damage to nearby structures or organs. ?Heart attack. ?Stroke. ? **Note De-Identified Ascher Obfuscation** Blood clots. ?Changes in heart rhythm. ?Device failure. ?What happens before the procedure? ?Staying hydrated ?Follow instructions from your health care provider about hydration, which may include: ?Up to 2 hours before the  procedure - you may continue to drink clear liquids, such as water, clear fruit juice, black coffee, and plain tea. ?Eating and drinking restrictions ?Follow instructions from your health care provider about eating and drinking, which may include: ?8 hours before the procedure - stop eating heavy meals or foods, such as meat, fried foods, or fatty foods. ?6 hours before the procedure - stop eating light meals or foods, such as toast or cereal. ?6 hours before the procedure - stop drinking milk or drinks that contain milk. ?2 hours before the procedure - stop drinking clear liquids. ?Medicines ?Ask your health care provider about: ?Changing or stopping your regular medicines. This is especially important if you are taking diabetes medicines or blood thinners. ?Taking medicines such as aspirin and ibuprofen. These medicines can thin your blood. Do not take these medicines unless your health care provider tells you to take them. ?Taking over-the-counter medicines, vitamins, herbs, and supplements. ?Tests ?You may have blood tests and a physical exam. ?You may have an electrocardiogram (ECG). This test checks your heart's electrical patterns and rhythms. ?General instructions ?Do not use any products that contain nicotine or tobacco. These include cigarettes, chewing tobacco, and vaping devices, such as e-cigarettes. If you need help quitting, ask your health care provider. ?Ask your health care provider what steps will be taken to help prevent infection. These steps may include: ?Removing hair at the surgery site. ?Washing skin with a germ-killing soap. ?Taking antibiotic medicine. ?Plan to have a responsible adult take you home from the hospital or clinic. ?Plan to have a responsible adult care for you for the time you are told after you leave the hospital or clinic. This is important. ?What happens during the procedure? ?An IV will be inserted into one of your veins. ?You will be given one or more of the  following: ?A medicine to help you relax (sedative). ?A medicine to make you fall asleep (general anesthetic). ?A small incision will be made in your groin area. ?A small wire will be put through the incision and into a blood vessel. ?Dye may be injected so X-rays can be used to guide the wire through the blood vessel. ?A long, thin tube (catheter) will be put over the small wire and moved up through the blood vessel to reach your heart. ?The closure device will be moved through the catheter until it reaches your heart. ?A small hole will be made in the septum (transseptal puncture). The septum is a thin tissue that separates the upper two chambers of the heart. ?The device will be placed so that it closes the LAA. X-rays will be done to make sure the device is in the right place. ?The catheter and wire will be removed. The closure device will remain in your heart. ?After pressure is applied over the catheter site to prevent bleeding, a bandage (dressing) will be placed over the site where the catheter was inserted. ?The procedure may vary among health care providers and hospitals. ?What happens after the procedure? ?Your blood pressure, heart rate, breathing rate, and blood oxygen level will be monitored until you leave the hospital or clinic. ?You may have to wear compression stockings. These stockings help to prevent blood clots and reduce swelling in your legs. ?If you were given a sedative during the procedure,  it can affect you for several hours. Do not drive or operate machinery until your health care provider says it is safe. ?You may be given pain medicine. ?You may need to drink more fluids to wash (flush) the dye out of your body. Drink enough fluid to keep your urine pale yellow. ?Take over-the-counter and prescription medicines only as told by your health care provider. This is especially important if you were given blood thinners. ?Summary ?Left atrial appendage (LAA) closure device implantation is a  procedure that is done to put a small device in the LAA of the heart. The LAA is a small sac in the wall of the heart's left upper chamber. ?The device closes the LAA to prevent stroke and other problems. ?Follow inst

## 2021-10-05 NOTE — Progress Notes (Addendum)
?Electrophysiology Office Note:   ? ?Date:  10/05/2021  ? ?ID:  Walter [name], DOB 1955-11-13, MRN [id number] ? ?PCP:  Alroy Dust, L.Marlou Sa, MD  ?[facility] Cardiologist:  None  ?[facility] Electrophysiologist:  Thompson Grayer, MD  ? ?Referring MD: Evans Lance, MD  ? ?Chief Complaint: New patient consult for Watchman ? ?History of Present Illness:   ? ?Walter [name] is a 66 y.o. male who presents for a consult regarding Watchman procedure at the request of Dr. Lovena Le. Their medical history includes paroxysmal atrial fibrillation, atrial flutter s/p ablation, DVT, stroke (initially hemorrhagic, not on [place], with subsequent embolic stroke). ? ?He saw Dr. Lovena Le 08/16/2021. He had been restarted on Eliquis following his acute DVT, provoked by extensive bed rest. Had extensive rehab at the [facility] in [place], [place]. He was maintaining NSR, and was pending referral to vascular specialist at [facility]. He is thought to be a poor candidate for long-term anticoagulation due to his history of bleeding (intracerebral, subdural, GI, retro-peritoneal) and HAS-BLED score of 2. Yearly major bleeding risk of 12.5%. ? ?He is accompanied by a family member. Overall, he is not feeling well, and does not feel much improvement.  ? ?About 2 weeks ago his Eliquis was stopped, and his IVC filter was removed last week. No issues during that procedure. ? ?He denies any palpitations, chest pain, shortness of breath, or peripheral edema. No lightheadedness, headaches, syncope, orthopnea, or PND. ? ? ?  ?Past Medical History:  ?Diagnosis Date  ? Atrial flutter ([place])   ? s/p ablation  ? DVT (deep venous thrombosis) ([place])   ? Near syncope 09/25/2013  ? Paroxysmal atrial fibrillation (HCC)   ? Stroke [facility])   ? initially hemorragic (not on [place]) followed by subsequent embolic stroke  ? ? ?Past Surgical History:  ?Procedure Laterality Date  ? A-FLUTTER ABLATION N/A 03/09/2020  ? Procedure: A-FLUTTER ABLATION;  Surgeon: Thompson Grayer, MD;   Location: [place] CV LAB;  Service: Cardiovascular;  Laterality: N/A;  ? CARDIOVERSION N/A 09/27/2013  ? Procedure: CARDIOVERSION;  Surgeon: Lelon Perla, MD;  Location: [facility] ENDOSCOPY;  Service: Cardiovascular;  Laterality: N/A;  ? IR ANGIO EXTERNAL CAROTID SEL EXT CAROTID UNI R MOD SED  09/26/2020  ? IR ANGIO INTRA EXTRACRAN SEL COM CAROTID INNOMINATE BILAT MOD SED  09/26/2020  ? IR ANGIO VERTEBRAL SEL VERTEBRAL UNI R MOD SED  09/26/2020  ? IR IVC FILTER PLMT / S&I /IMG GUID/MOD SED  10/08/2020  ? IR IVC FILTER RETRIEVAL / S&I /IMG GUID/MOD SED  09/28/2021  ? IR IVUS EACH ADDITIONAL NON CORONARY VESSEL  11/02/2020  ? IR PTA VENOUS EXCEPT DIALYSIS CIRCUIT  11/02/2020  ? IR RADIOLOGIST EVAL & MGMT  01/17/2021  ? IR RADIOLOGIST EVAL & MGMT  09/12/2021  ? IR THROMBECT VENO MECH MOD SED  10/27/2020  ? IR THROMBECT VENO MECH MOD SED  11/02/2020  ? IR US GUIDE VASC ACCESS LEFT  10/27/2020  ? IR US GUIDE VASC ACCESS RIGHT  09/26/2020  ? IR US GUIDE VASC ACCESS RIGHT  11/02/2020  ? IR VENO/EXT/UNI RIGHT  11/02/2020  ? IR VENOCAVAGRAM IVC  10/27/2020  ? RETINAL DETACHMENT SURGERY    ? right knee arthroscopy    ? TEE WITHOUT CARDIOVERSION N/A 09/27/2013  ? Procedure: TRANSESOPHAGEAL ECHOCARDIOGRAM (TEE);  Surgeon: Lelon Perla, MD;  Location: [facility] ENDOSCOPY;  Service: Cardiovascular;  Laterality: N/A;  ? WRIST SURGERY    ? ? ?Current Medications: ?Current Meds  ?Medication Sig  ?  atorvastatin (LIPITOR) 20 MG tablet Take 1 tablet (20 mg total) by mouth at bedtime.  ? diltiazem (CARDIZEM CD) 120 MG 24 hr capsule Take 1 capsule (120 mg total) by mouth daily.  ? EPINEPHrine 0.3 mg/0.3 mL IJ SOAJ injection Inject 0.3 mg into the muscle as needed for anaphylaxis.  ? mirtazapine (REMERON) 7.5 MG tablet TAKE 2 TABLETS BY MOUTH AT BEDTIME  ? sertraline (ZOLOFT) 100 MG tablet Take 100 mg by mouth daily.  ?  ? ?Allergies:   Keppra [levetiracetam] and Latex  ? ?Social History  ? ?Socioeconomic History  ? Marital status: Married  ?  Spouse name: Walter [name]   ? Number of children: Not on file  ? Years of education: Not on file  ? Highest education level: Not on file  ?Occupational History  ? Not on file  ?Tobacco Use  ? Smoking status: Never  ? Smokeless tobacco: Never  ?Vaping Use  ? Vaping Use: Never used  ?Substance and Sexual Activity  ? Alcohol use: Yes  ?  Comment: occasionally  ? Drug use: No  ? Sexual activity: Not on file  ?Other Topics Concern  ? Not on file  ?Social History Narrative  ? Lives with wife at home  ? Right Handed  ? Drinks decaf  ? ?Social Determinants of Health  ? ?Financial Resource Strain: Not on file  ?Food Insecurity: Not on file  ?Transportation Needs: Not on file  ?Physical Activity: Not on file  ?Stress: Not on file  ?Social Connections: Not on file  ?  ? ?Family History: ?The patient's family history includes Healthy in his brother and sister; Heart disease in his father and mother. ? ?ROS:   ?Please see the history of present illness.    ?All other systems reviewed and are negative. ? ?EKGs/Labs/Other Studies Reviewed:   ? ?The following studies were reviewed today: ? ?Bilateral LE Venous Doppler 09/12/2021: ?IMPRESSION: ?Complete resolution of previously visualized residual bilateral ?lower extremity deep vein thrombosis. ? ?CT Chest 10/17/2020: ?FINDINGS: ?CT CHEST FINDINGS ?  ?Cardiovascular: Aortic atherosclerosis. Normal heart size, without ?pericardial effusion. ?  ?Bilateral pulmonary emboli are identified on this non dedicated ?exam. Example in the central right pulmonary artery on 28/3. Branch ?pulmonary arteries to both upper and lower lobes on 32/3. Within the ?left upper lobe pulmonary artery branch on 20/3. ?  ?Mediastinum/Nodes: No mediastinal or hilar adenopathy. ?  ?Lungs/Pleura: No pleural fluid. Endotracheal tube terminates ?appropriately. ?  ?Areas of mild peripheral predominant airspace and ground-glass are ?indeterminate. Example at the left apex on 22/5 and right lower lobe ?on 58/5. More dependent bibasilar airspace  opacities are likely due ?to atelectasis. ?  ?Musculoskeletal: No acute osseous abnormality. ?  ?CT ABDOMEN PELVIS FINDINGS ?  ?Hepatobiliary: Mild hepatomegaly at 18.4 cm craniocaudal. Normal ?gallbladder, without biliary ductal dilatation. ?  ?Pancreas: Normal, without mass or ductal dilatation. ?  ?Spleen: Normal in size, without focal abnormality. ?  ?Adrenals/Urinary Tract: Normal adrenal glands. Bilateral renal sinus ?cysts, without hydronephrosis. Foley catheter within the urinary ?bladder. ?  ?Stomach/Bowel: Nasogastric tube terminating at the gastric antrum. ?Rectal catheter in place. Scattered colonic diverticula. Normal ?terminal ileum and appendix. Normal small bowel. ?  ?Vascular/Lymphatic: Aortic atherosclerosis. IVC filter is ?appropriately positioned. No abdominopelvic adenopathy. ?  ?Reproductive: Mild prostatomegaly. ?  ?Other: No significant free fluid. No free intraperitoneal air. Mild ?pelvic anasarca. tiny bilateral fat containing inguinal hernias. ?  ?Musculoskeletal: Degenerative partial fusion of bilateral sacroiliac ?joints in. Degenerate disc disease at the lumbosacral  junction. ?  ?IMPRESSION: ?1. Bilateral, large volume right greater than left pulmonary emboli ?on this nondedicated exam. No evidence of right heart strain. ?2. Mild peripheral ground-glass opacities, favoring atelectasis or ?infarct. Atypical infection felt less likely. ?3. Otherwise, no explanation for fever. ?4.  Aortic Atherosclerosis (ICD10-I70.0). ?5. Prostatomegaly. ? ?Echo 09/25/2020: ? 1. Left ventricular ejection fraction, by estimation, is 55 to 60%. The  ?left ventricle has normal function. The left ventricle has no regional  ?wall motion abnormalities. Left ventricular diastolic parameters are  ?consistent with Grade I diastolic  ?dysfunction (impaired relaxation).  ? 2. Right ventricular systolic function is normal. The right ventricular  ?size is normal. There is normal pulmonary artery systolic pressure. The   ?estimated right ventricular systolic pressure is [id number] mmHg.  ? 3. The mitral valve is normal in structure. No evidence of mitral valve  ?regurgitation. No evidence of mitral stenosis.  ? 4. The aort

## 2021-10-08 ENCOUNTER — Encounter: Payer: Self-pay | Admitting: Occupational Therapy

## 2021-10-08 ENCOUNTER — Ambulatory Visit: Payer: Medicare Other | Admitting: Occupational Therapy

## 2021-10-08 DIAGNOSIS — R2681 Unsteadiness on feet: Secondary | ICD-10-CM

## 2021-10-08 DIAGNOSIS — M6281 Muscle weakness (generalized): Secondary | ICD-10-CM

## 2021-10-08 DIAGNOSIS — R278 Other lack of coordination: Secondary | ICD-10-CM | POA: Diagnosis not present

## 2021-10-08 DIAGNOSIS — R41842 Visuospatial deficit: Secondary | ICD-10-CM

## 2021-10-08 DIAGNOSIS — R270 Ataxia, unspecified: Secondary | ICD-10-CM

## 2021-10-08 DIAGNOSIS — R41844 Frontal lobe and executive function deficit: Secondary | ICD-10-CM

## 2021-10-08 NOTE — Therapy (Signed)
?OUTPATIENT OCCUPATIONAL THERAPY TREATMENT NOTE ? ? ?Patient Name: Walter [name] ?MRN: [id number] ?DOB:1956/02/17, 66 y.o., male ?Today's Date: 10/08/2021 ? ?PCP: Alroy Dust, L.Marlou Sa, MD ?REFERRING PROVIDER: Alroy Dust, L.Marlou Sa, MD ? ? OT End of Session - 10/08/21 1910   ? ? Visit Number 5   ? Number of Visits 25   ? Date for OT Re-Evaluation 11/28/21   ? Authorization Type Medicare Part A & B   ? Progress Note Due on Visit 10   ? OT Start Time 1415   ? OT Stop Time 1500   ? OT Time Calculation (min) 45 min   ? Activity Tolerance Patient tolerated treatment well   ? Behavior During Therapy [facility] for tasks assessed/performed   ? ?  ?  ? ?  ? ? ?Past Medical History:  ?Diagnosis Date  ? Atrial flutter ([place])   ? s/p ablation  ? DVT (deep venous thrombosis) ([place])   ? Near syncope 09/25/2013  ? Paroxysmal atrial fibrillation (HCC)   ? Stroke [facility])   ? initially hemorragic (not on [place]) followed by subsequent embolic stroke  ? ?Past Surgical History:  ?Procedure Laterality Date  ? A-FLUTTER ABLATION N/A 03/09/2020  ? Procedure: A-FLUTTER ABLATION;  Surgeon: Thompson Grayer, MD;  Location: [place] CV LAB;  Service: Cardiovascular;  Laterality: N/A;  ? CARDIOVERSION N/A 09/27/2013  ? Procedure: CARDIOVERSION;  Surgeon: Lelon Perla, MD;  Location: [facility] ENDOSCOPY;  Service: Cardiovascular;  Laterality: N/A;  ? IR ANGIO EXTERNAL CAROTID SEL EXT CAROTID UNI R MOD SED  09/26/2020  ? IR ANGIO INTRA EXTRACRAN SEL COM CAROTID INNOMINATE BILAT MOD SED  09/26/2020  ? IR ANGIO VERTEBRAL SEL VERTEBRAL UNI R MOD SED  09/26/2020  ? IR IVC FILTER PLMT / S&I /IMG GUID/MOD SED  10/08/2020  ? IR IVC FILTER RETRIEVAL / S&I /IMG GUID/MOD SED  09/28/2021  ? IR IVUS EACH ADDITIONAL NON CORONARY VESSEL  11/02/2020  ? IR PTA VENOUS EXCEPT DIALYSIS CIRCUIT  11/02/2020  ? IR RADIOLOGIST EVAL & MGMT  01/17/2021  ? IR RADIOLOGIST EVAL & MGMT  09/12/2021  ? IR THROMBECT VENO MECH MOD SED  10/27/2020  ? IR THROMBECT VENO MECH MOD SED  11/02/2020  ? IR US GUIDE VASC ACCESS  LEFT  10/27/2020  ? IR US GUIDE VASC ACCESS RIGHT  09/26/2020  ? IR US GUIDE VASC ACCESS RIGHT  11/02/2020  ? IR VENO/EXT/UNI RIGHT  11/02/2020  ? IR VENOCAVAGRAM IVC  10/27/2020  ? RETINAL DETACHMENT SURGERY    ? right knee arthroscopy    ? TEE WITHOUT CARDIOVERSION N/A 09/27/2013  ? Procedure: TRANSESOPHAGEAL ECHOCARDIOGRAM (TEE);  Surgeon: Lelon Perla, MD;  Location: [place];  Service: Cardiovascular;  Laterality: N/A;  ? WRIST SURGERY    ? ?Patient Active Problem List  ? Diagnosis Date Noted  ? Hypertension 08/16/2021  ? 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 Residual cognitive deficit as late effect of stroke 04/19/2021  ? Hemiparesis affecting right side as late effect of stroke ([place]) 04/19/2021  ? 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 ([place]) 04/19/2021  ? Thrombosis   ? Sleep disturbance   ? Dysphagia, post-stroke   ? PAF (paroxysmal atrial fibrillation) ([place])   ? Acute pulmonary embolism without acute cor pulmonale (HCC)   ? Malnutrition of moderate degree 11/04/2020  ? Tracheostomy care [facility])   ? Pressure injury of skin 10/23/2020  ? Intracranial hemorrhage ([place])   ? Atrial fibrillation ([place])   ?  Prediabetes   ? Leukocytosis   ? Acute blood loss anemia   ? Hypernatremia   ? ICH (intracerebral hemorrhage) ([place]) 09/25/2020  ? Unilateral primary osteoarthritis, left knee 09/26/2016  ? Atrial flutter ([place]) 09/25/2013  ? Near syncope 09/25/2013  ? ? ?ONSET DATE: 09/17/20 ? ?REFERRING DIAG: I62.9 (ICD-10-CM) - Intracranial hemorrhage ([place]) ? ?THERAPY DIAG:  ?Muscle weakness (generalized) ? ?Unsteadiness on feet ? ?Other lack of coordination ? ?Visuospatial deficit ? ?Frontal lobe and executive function deficit ? ?Ataxia ? ? ?PERTINENT HISTORY: ICH of brainstem (complicated by hydrocephalus, DVTs, and tracheostomy).  PMH:  a-fib  ? ?PRECAUTIONS: fall risk, ataxia, impulsivity, diplopia  ? ? ?SUBJECTIVE: Pt reports weekend was good.  Denies any pain.  ? ?PAIN:  ?Are you having pain? No ? ? ? ? ?OBJECTIVE:  ? ?TODAY'S TREATMENT:  ? ?10/08/21:  Patient seen for aquatic therapy visit today. Patient entered pool deck via wheelchair - wife present.  Patient entered the pool via stairs and 2 railings with close supervision to min assist.  Patient initially working with eyes closed (descending stairs) then one eye closed in pool.  Encouraged patient to wear glasses (mid-line taped) to reduce diplopia in pool env't as balance was a key component of the session.  Worked on functional mobility - starting with static standing and progressing to dynamic stand balance and walking.  Patient did best with cueing to maintain small steps (especially with left lower extremity, and to move slowly to help identify early sign of loss of balance to possibly correct.  Worked in 3.5-4.8 ft of water - used principles of buoyancy as well as turbulence to challenge balance.   ? ?10/03/21:  Assessed patient for visual midline shift - patient with accurate awareness in seated position of left/right/midline.  However with slightly inferior inclination with vertical testing.  In standing initially - patient with strong posterior bias - legs braced against the mat table, and shoulders well behind hips.  In standing in free space - using posture grid - took photos of patient's posture and allowed him to make observations.  Patient able to identify postural asymmetries and made attempts to self correct.   ?Discussed plan for aquatic therapy.  Patient wil lstart OT aquatics on Monday as per OT plan of care.   ? ? ? ? ? ? ? ? OT Short Term Goals - 09/19/21 1424   ? ?  ? OT SHORT TERM GOAL #1  ? Title Pt will be independent with HEP for LUE coordination   ? Time 4   ? Period Weeks   ? Status On-going   reviewed coordination HEP 09/19/21  ? Target Date 10/03/21   ?  ? OT SHORT TERM GOAL #2  ? Title Pt will verbalize understanding of sensory strategies for RUE sensory deficits.   ?  Time 4   ? Period Weeks   ? Status Achieved pt reports testing with LUE first for temperature. 09/24/21  ?  ? OT SHORT TERM GOAL #3  ? Title Pt will improve LUE coordination for ADLs as shown by improving score on box and blocks test by at least 6.   ? Baseline L 18 R 39   ? Time 4   ? Period Weeks   ? Status On-going  Continue to work on coordination  ? ?  ?  ? ?  ? ? ? OT Long Term Goals - 09/05/21 1459   ? ?  ? OT LONG TERM GOAL #1  ?  Title Pt will be independent with Aquatics HEP PRN   ? Time 12   ? Period Weeks   ? Status New   ? Target Date 11/28/21   ?  ? OT LONG TERM GOAL #2  ? Title Pt will improve LUE hand coordination for ADLs as shown by completing 9-hole peg test in less than 90 seconds   ? Baseline 5 pegs in 2 minutes with several drops and knocking pegs out   ? Time 12   ? Period Weeks   ? Status Ongoing  Working on coordination  ?  ? OT LONG TERM GOAL #3  ? Title Pt will verbalize understanding of visual compensation strategies and functional scanning strategies for increasing safety with ADLs and IADLs.   ? Time 12   ? Period Weeks   ? Status Ongoing  Continue progress  ?  ? OT LONG TERM GOAL #4  ? Title Pt will perform environmental scanning with 90% accuracy for increased safety.  ?Time 12   ?Period Weeks   ?Status Ongoing  ?  ?  ?  ? OT LONG TERM GOAL #5  ? Title Pt will demo good safety awareness/compensation for sensory deficits and attention to LUE during functional tasks and transfers without cueing.   ? Time 12   ? Period Weeks   ? Status Ongoing  ?  ? OT LONG TERM GOAL #6  ? Title Pt will report increased ease and accuracy with handwriting and texting with visual strategies and compensations as needed.   ? Time 12   ? Period Weeks   ? Status Ongoing   ? ?  ?  ? ?  ? ? ? Plan - 09/24/21 1354   ? ? Clinical Impression Statement Pt is completing hybrid OT program in which he will complete OT in aquatic environment x1, and on land x1 to help address balance, coordiantion, and postural control as  related to ADL/IADL.     ? OT Occupational Profile and History Detailed Assessment- Review of Records and additional review of physical, cognitive, psychosocial history related to current functional perform

## 2021-10-09 ENCOUNTER — Encounter: Payer: Self-pay | Admitting: Cardiology

## 2021-10-10 ENCOUNTER — Ambulatory Visit: Payer: Medicare Other | Admitting: Occupational Therapy

## 2021-10-10 ENCOUNTER — Encounter: Payer: Self-pay | Admitting: Occupational Therapy

## 2021-10-10 ENCOUNTER — Ambulatory Visit: Payer: Medicare Other

## 2021-10-10 DIAGNOSIS — R41842 Visuospatial deficit: Secondary | ICD-10-CM

## 2021-10-10 DIAGNOSIS — M6281 Muscle weakness (generalized): Secondary | ICD-10-CM

## 2021-10-10 DIAGNOSIS — R278 Other lack of coordination: Secondary | ICD-10-CM

## 2021-10-10 DIAGNOSIS — R41844 Frontal lobe and executive function deficit: Secondary | ICD-10-CM

## 2021-10-10 DIAGNOSIS — R2681 Unsteadiness on feet: Secondary | ICD-10-CM

## 2021-10-10 DIAGNOSIS — R270 Ataxia, unspecified: Secondary | ICD-10-CM

## 2021-10-10 NOTE — Therapy (Signed)
?OUTPATIENT PHYSICAL THERAPY TREATMENT NOTE ? ? ?Patient Name: Walter [name] ?MRN: [id number] ?DOB:Jun 12, 1956, 66 y.o., male ?Today's Date: 10/10/2021 ? ?PCP: Clovis Riley, L.August Saucer, MD ?REFERRING PROVIDER: Clovis Riley, L.August Saucer, MD ? ? PT End of Session - 10/10/21 1105   ? ? Visit Number 5   ? Number of Visits 9   ? Date for PT Re-Evaluation 11/02/21   ? Authorization Type Medicare so 10th visit progress note   ? PT Start Time 1101   ? PT Stop Time 1143   ? PT Time Calculation (min) 42 min   ? Equipment Utilized During Treatment Gait belt   ? Activity Tolerance Patient tolerated treatment well   ? Behavior During Therapy Impulsive;WFL for tasks assessed/performed   ? ?  ?  ? ?  ? ? ? ?Past Medical History:  ?Diagnosis Date  ? Atrial flutter (HCC)   ? s/p ablation  ? DVT (deep venous thrombosis) (HCC)   ? Near syncope 09/25/2013  ? Paroxysmal atrial fibrillation (HCC)   ? Stroke [facility])   ? initially hemorragic (not on OAC) followed by subsequent embolic stroke  ? ?Past Surgical History:  ?Procedure Laterality Date  ? A-FLUTTER ABLATION N/A 03/09/2020  ? Procedure: A-FLUTTER ABLATION;  Surgeon: Hillis Range, MD;  Location: [facility] INVASIVE CV LAB;  Service: Cardiovascular;  Laterality: N/A;  ? CARDIOVERSION N/A 09/27/2013  ? Procedure: CARDIOVERSION;  Surgeon: Lewayne Bunting, MD;  Location: [facility] ENDOSCOPY;  Service: Cardiovascular;  Laterality: N/A;  ? IR ANGIO EXTERNAL CAROTID SEL EXT CAROTID UNI R MOD SED  09/26/2020  ? IR ANGIO INTRA EXTRACRAN SEL COM CAROTID INNOMINATE BILAT MOD SED  09/26/2020  ? IR ANGIO VERTEBRAL SEL VERTEBRAL UNI R MOD SED  09/26/2020  ? IR IVC FILTER PLMT / S&I /IMG GUID/MOD SED  10/08/2020  ? IR IVC FILTER RETRIEVAL / S&I /IMG GUID/MOD SED  09/28/2021  ? IR IVUS EACH ADDITIONAL NON CORONARY VESSEL  11/02/2020  ? IR PTA VENOUS EXCEPT DIALYSIS CIRCUIT  11/02/2020  ? IR RADIOLOGIST EVAL & MGMT  01/17/2021  ? IR RADIOLOGIST EVAL & MGMT  09/12/2021  ? IR THROMBECT VENO MECH MOD SED  10/27/2020  ? IR THROMBECT VENO MECH MOD  SED  11/02/2020  ? IR US GUIDE VASC ACCESS LEFT  10/27/2020  ? IR US GUIDE VASC ACCESS RIGHT  09/26/2020  ? IR US GUIDE VASC ACCESS RIGHT  11/02/2020  ? IR VENO/EXT/UNI RIGHT  11/02/2020  ? IR VENOCAVAGRAM IVC  10/27/2020  ? RETINAL DETACHMENT SURGERY    ? right knee arthroscopy    ? TEE WITHOUT CARDIOVERSION N/A 09/27/2013  ? Procedure: TRANSESOPHAGEAL ECHOCARDIOGRAM (TEE);  Surgeon: Lewayne Bunting, MD;  Location: [facility] ENDOSCOPY;  Service: Cardiovascular;  Laterality: N/A;  ? WRIST SURGERY    ? ?Patient Active Problem List  ? Diagnosis Date Noted  ? Hypertension 08/16/2021  ? 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 Residual cognitive deficit as late effect of stroke 04/19/2021  ? Hemiparesis affecting right side as late effect of stroke (HCC) 04/19/2021  ? 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 (HCC) 04/19/2021  ? Thrombosis   ? Sleep disturbance   ? Dysphagia, post-stroke   ? PAF (paroxysmal atrial fibrillation) (HCC)   ? Acute pulmonary embolism without acute cor pulmonale (HCC)   ? Malnutrition of moderate degree 11/04/2020  ? Tracheostomy care [facility])   ? Pressure injury of skin 10/23/2020  ? Intracranial hemorrhage (HCC)   ? Atrial  fibrillation (HCC)   ? Prediabetes   ? Leukocytosis   ? Acute blood loss anemia   ? Hypernatremia   ? ICH (intracerebral hemorrhage) (HCC) 09/25/2020  ? Unilateral primary osteoarthritis, left knee 09/26/2016  ? Atrial flutter (HCC) 09/25/2013  ? Near syncope 09/25/2013  ? ? ?ONSET DATE: 08/29/2021 (CVA was 09/25/20) ?  ?REFERRING DIAG: I62.9 (ICD-10-CM) - Intracranial hemorrhage  ? ?THERAPY DIAG:  ?Muscle weakness (generalized) ? ?Unsteadiness on feet ? ?Other lack of coordination ? ?Ataxia ? ?PERTINENT HISTORY: atrial flutter s/p ablation, DVT with IVC filter, near syncope, CVA- Brainstem bleed involving the left midbrain, pons as well as left thalamus. 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 ?PRECAUTIONS: Fall  ? ?SUBJECTIVE: Patient reports no new changes/complaints. Patient presents with U Step Rolling Walker, reports he has had it about a week. Reports he is convinced that it is the best of the rollator concepts due to it locking without pressing. No pain. No falls.  ? ?PAIN:  ?Are you having pain? No ? ?  ? ?TODAY'S TREATMENT:  ? GAIT: ?Gait pattern: step through pattern, decreased step length- Right, decreased step length- Left, ataxic, and trunk flexed ?Distance walked: ambulation throughout clinic with rollator ?Assistive device utilized: U [employer] and gait belt ?Level of assistance: CGA and Min A ?Comments: PT educating on safety concerns with U Step Rolling Walker vs. Normal RW.  ? ?TherEx: ?Completed SciFit with BLE/BUE on level 4.5 x 8 minutes for endurance and UE/LE coordination. Cues for control with movement and to maintain steady pace, approx 65-75 steps/minutes. ? ?NMR: ?Standing with chair positioned on side and single UE support, completed anterior posterior steps fwd/back to target with PT calling out LE/Color to further challenge patient. Completed with single command and single step x 10 reps bilat, then transitioned to bil command to further challenge balance and SLS. Most challenge noted with stance on LLE. CGA throughout, with one instance of Min A required due to imbalance  ?Seated on Mat with use of Soccerball, with single LE placed on soccerball completed anterior/posterior rolls x 15 reps each working on improved coordination and control. Then transitioned to completed alternating tap to target on soccer ball with BLE x 15 reps.  ?Seated on Mat: completed catch of ball (PT tossing to patient) with trunk rotation to R/L and then eccentric/concentric lowering to bolster with 2 pillows for core activation, with return to upright. Completed x 15 reps, with intermittent cues for improved trunk rotation.   ? ? ?PATIENT EDUCATION: ?Education details: See Gait Section (Safety Concerns with U Step Walker ?Person educated: Patient ?Education method: Explanation ?Education comprehension: verbalized understanding ?  ?  ?HOME EXERCISE PROGRAM: ? Access Code: [email or web address] ?  ?ASSESSMENT: ?  ?CLINICAL IMPRESSION: ?Patient presented to today's skilled PT session with new U Step Rolling Walker. Patient demo multiple instances of hitting foot on lateral side of rolling walker. Focused session on continued coordination and core activation exercises. Will continue per POC.  ?  ?  ?OBJECTIVE IMPAIRMENTS Abnormal gait, decreased activity tolerance, decreased balance, decreased cognition, decreased coordination, decreased endurance, decreased knowledge of use of DME, decreased mobility, decreased strength, impaired sensation, impaired UE functional use, and impaired vision/preception.  ?  ?ACTIVITY LIMITATIONS cleaning, community activity, and yard work.  ?  ?PERSONAL FACTORS Time since onset of injury/illness/exacerbation and 1-2 comorbidities: atrial flutter s/p ablation, DVT with IVC filter,  are also affecting  patient's functional outcome.  ?  ?  ?REHAB POTENTIAL: Fair   ?  ?CLINICAL DECISION MAKING: Evolving/moderate complexity ?  ?EVALUATION COMPLEXITY: Moderate ?  ?  ?GOALS: ?Goals reviewed with patient? Yes ?  ?SHORT TERM GOALS: ?  ?Pt will be able to perform initial HEP for core stabilization, balance to continue gains on own with supervision of family.  ?Baseline:  ?Target date: 10/04/2021 ?Goal status: INITIAL ?  ?2.  Pt will be able to ambulate up/down curb with FWW supervision to be able to safely enter new home with 2 steps to enter. ?Baseline:  ?Target date: 10/04/2021 ?Goal status: INITIAL ?  ?3.  Sharlene Motts will be performed and LTG updated ?Baseline: Assessed 09/19/2021 ?Target date: 10/04/2021 ?Goal status: INITIAL ?  ?  ?  ?LONG TERM GOALS: ?  ?Pt will be able to perform progressive HEP for balance, core strengthening and aerobic  activity to continue gains on own with assistance of family/caregiver. (LTGs due 11/02/21) ?  ?Baseline:  ?Target date:  11/02/21 ?Goal status: INITIAL ?  ?2.  Pt will increase gait speed from 0.77m/s to >0.49m/s for imp

## 2021-10-10 NOTE — Therapy (Signed)
?OUTPATIENT OCCUPATIONAL THERAPY TREATMENT NOTE ? ? ?Patient Name: Walter [name] ?MRN: [id number] ?DOB:1956/04/09, 66 y.o., male ?Today's Date: 10/10/2021 ? ?PCP: Alroy Dust, L.Marlou Sa, MD ?REFERRING PROVIDER: Alroy Dust, L.Marlou Sa, MD ? ? OT End of Session - 10/10/21 1148   ? ? Visit Number 6   ? Number of Visits 25   ? Date for OT Re-Evaluation 11/28/21   ? Authorization Type Medicare Part A & B   ? Progress Note Due on Visit 10   ? OT Start Time 1146   ? OT Stop Time 1230   ? OT Time Calculation (min) 44 min   ? Activity Tolerance Patient tolerated treatment well   ? Behavior During Therapy [facility] for tasks assessed/performed   ? ?  ?  ? ?  ? ? ?Past Medical History:  ?Diagnosis Date  ? Atrial flutter ([place])   ? s/p ablation  ? DVT (deep venous thrombosis) ([place])   ? Near syncope 09/25/2013  ? Paroxysmal atrial fibrillation (HCC)   ? Stroke [facility])   ? initially hemorragic (not on [place]) followed by subsequent embolic stroke  ? ?Past Surgical History:  ?Procedure Laterality Date  ? A-FLUTTER ABLATION N/A 03/09/2020  ? Procedure: A-FLUTTER ABLATION;  Surgeon: Thompson Grayer, MD;  Location: [place] CV LAB;  Service: Cardiovascular;  Laterality: N/A;  ? CARDIOVERSION N/A 09/27/2013  ? Procedure: CARDIOVERSION;  Surgeon: Lelon Perla, MD;  Location: [facility] ENDOSCOPY;  Service: Cardiovascular;  Laterality: N/A;  ? IR ANGIO EXTERNAL CAROTID SEL EXT CAROTID UNI R MOD SED  09/26/2020  ? IR ANGIO INTRA EXTRACRAN SEL COM CAROTID INNOMINATE BILAT MOD SED  09/26/2020  ? IR ANGIO VERTEBRAL SEL VERTEBRAL UNI R MOD SED  09/26/2020  ? IR IVC FILTER PLMT / S&I /IMG GUID/MOD SED  10/08/2020  ? IR IVC FILTER RETRIEVAL / S&I /IMG GUID/MOD SED  09/28/2021  ? IR IVUS EACH ADDITIONAL NON CORONARY VESSEL  11/02/2020  ? IR PTA VENOUS EXCEPT DIALYSIS CIRCUIT  11/02/2020  ? IR RADIOLOGIST EVAL & MGMT  01/17/2021  ? IR RADIOLOGIST EVAL & MGMT  09/12/2021  ? IR THROMBECT VENO MECH MOD SED  10/27/2020  ? IR THROMBECT VENO MECH MOD SED  11/02/2020  ? IR US GUIDE VASC ACCESS  LEFT  10/27/2020  ? IR US GUIDE VASC ACCESS RIGHT  09/26/2020  ? IR US GUIDE VASC ACCESS RIGHT  11/02/2020  ? IR VENO/EXT/UNI RIGHT  11/02/2020  ? IR VENOCAVAGRAM IVC  10/27/2020  ? RETINAL DETACHMENT SURGERY    ? right knee arthroscopy    ? TEE WITHOUT CARDIOVERSION N/A 09/27/2013  ? Procedure: TRANSESOPHAGEAL ECHOCARDIOGRAM (TEE);  Surgeon: Lelon Perla, MD;  Location: [place];  Service: Cardiovascular;  Laterality: N/A;  ? WRIST SURGERY    ? ?Patient Active Problem List  ? Diagnosis Date Noted  ? Hypertension 08/16/2021  ? 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 Residual cognitive deficit as late effect of stroke 04/19/2021  ? Hemiparesis affecting right side as late effect of stroke ([place]) 04/19/2021  ? 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 ([place]) 04/19/2021  ? Thrombosis   ? Sleep disturbance   ? Dysphagia, post-stroke   ? PAF (paroxysmal atrial fibrillation) ([place])   ? Acute pulmonary embolism without acute cor pulmonale (HCC)   ? Malnutrition of moderate degree 11/04/2020  ? Tracheostomy care [facility])   ? Pressure injury of skin 10/23/2020  ? Intracranial hemorrhage ([place])   ? Atrial fibrillation ([place])   ?  Prediabetes   ? Leukocytosis   ? Acute blood loss anemia   ? Hypernatremia   ? ICH (intracerebral hemorrhage) ([place]) 09/25/2020  ? Unilateral primary osteoarthritis, left knee 09/26/2016  ? Atrial flutter ([place]) 09/25/2013  ? Near syncope 09/25/2013  ? ? ?ONSET DATE: 09/17/20 ? ?REFERRING DIAG: I62.9 (ICD-10-CM) - Intracranial hemorrhage ([place]) ? ?THERAPY DIAG:  ?Muscle weakness (generalized) ? ?Unsteadiness on feet ? ?Other lack of coordination ? ?Visuospatial deficit ? ?Frontal lobe and executive function deficit ? ?Ataxia ? ? ?PERTINENT HISTORY: ICH of brainstem (complicated by hydrocephalus, DVTs, and tracheostomy).  PMH:  a-fib  ? ?PRECAUTIONS: fall risk, ataxia, impulsivity, diplopia  ? ? ?SUBJECTIVE: Pt arrived with USTEP walker  today. ? ?PAIN:  ?Are you having pain? No ? ? ? ? ?OBJECTIVE:  ? ?TODAY'S TREATMENT:  ? ?10/10/21 ? ?Large Pegboard for emphasis on LUE coordination increase shoulder stability with vertical surface and visual scanning for placing pegs and following up to board.  Pt with max difficulty and max drops. ? ?Standing with vertical surface writing on mirror with tic tac toe and writing name and connect the dots for visual scanning, standing balance and increased shoulder stability, and coordination. ? ?Reaching  for visual scanning and coordination with LUE  ? ?Arm Bike: for conditioning and reciprocal movements on Level 1 for 6 minutes (forward and backward) ? ? ? ? ? ? ? ? ? OT Short Term Goals - 09/19/21 1424   ? ?  ? OT SHORT TERM GOAL #1  ? Title Pt will be independent with HEP for LUE coordination   ? Time 4   ? Period Weeks   ? Status Achieved   reviewed coordination HEP 09/19/21  ? Target Date 10/03/21   ?  ? OT SHORT TERM GOAL #2  ? Title Pt will verbalize understanding of sensory strategies for RUE sensory deficits.   ? Time 4   ? Period Weeks   ? Status Achieved pt reports testing with LUE first for temperature. 09/24/21  ?  ? OT SHORT TERM GOAL #3  ? Title Pt will improve LUE coordination for ADLs as shown by improving score on box and blocks test by at least 6.   ? Baseline L 18 R 39   ? Time 4   ? Period Weeks   ? Status Achieved  LUE 22 blocks   ? ?  ?  ? ?  ? ? ? OT Long Term Goals - 09/05/21 1459   ? ?  ? OT LONG TERM GOAL #1  ? Title Pt will be independent with Aquatics HEP PRN   ? Time 12   ? Period Weeks   ? Status New   ? Target Date 11/28/21   ?  ? OT LONG TERM GOAL #2  ? Title Pt will improve LUE hand coordination for ADLs as shown by completing 9-hole peg test in less than 90 seconds   ? Baseline 5 pegs in 2 minutes with several drops and knocking pegs out   ? Time 12   ? Period Weeks   ? Status Ongoing  Working on coordination  ?  ? OT LONG TERM GOAL #3  ? Title Pt will verbalize understanding of  visual compensation strategies and functional scanning strategies for increasing safety with ADLs and IADLs.   ? Time 12   ? Period Weeks   ? Status Ongoing  Continue progress  ?  ? OT LONG TERM GOAL #4  ? Title Pt  will perform environmental scanning with 90% accuracy for increased safety.  ?Time 12   ?Period Weeks   ?Status Ongoing  ?  ?  ?  ? OT LONG TERM GOAL #5  ? Title Pt will demo good safety awareness/compensation for sensory deficits and attention to LUE during functional tasks and transfers without cueing.   ? Time 12   ? Period Weeks   ? Status Ongoing  ?  ? OT LONG TERM GOAL #6  ? Title Pt will report increased ease and accuracy with handwriting and texting with visual strategies and compensations as needed.   ? Time 12   ? Period Weeks   ? Status Ongoing   ? ?  ?  ? ?  ? ? ? Plan - 09/24/21 1354   ? ? Clinical Impression Statement Pt is completing hybrid OT program in which he will complete OT in aquatic environment x1, and on land x1 to help address balance, coordiantion, and postural control as related to ADL/IADL.     ? OT Occupational Profile and History Detailed Assessment- Review of Records and additional review of physical, cognitive, psychosocial history related to current functional performance   ? Occupational performance deficits (Please refer to evaluation for details): ADL's;IADL's;Work;Leisure;Social Participation   ? Body Structure / Function / Physical Skills ADL;Strength;Balance;Proprioception;UE functional use;IADL;Endurance;Vision;Mobility;Coordination;Decreased knowledge of precautions;FMC;GMC;Sensation   ? Cognitive Skills Attention;Safety Awareness;Memory;Perception;Problem Solve   ? Rehab Potential Fair   d/t duration from initial event  ? Clinical Decision Making Several treatment options, min-mod task modification necessary   ? Comorbidities Affecting Occupational Performance: May have comorbidities impacting occupational performance   ? Modification or Assistance to Complete  Evaluation  Min-Moderate modification of tasks or assist with assess necessary to complete eval   ? OT Frequency Other (comment)   1-2 visits based on need for aquatics  ? OT Duration 12 weeks   may d/c early ba

## 2021-10-15 ENCOUNTER — Ambulatory Visit (INDEPENDENT_AMBULATORY_CARE_PROVIDER_SITE_OTHER): Payer: Medicare Other | Admitting: Neurology

## 2021-10-15 VITALS — BP 126/74 | HR 67

## 2021-10-15 DIAGNOSIS — I4891 Unspecified atrial fibrillation: Secondary | ICD-10-CM | POA: Diagnosis not present

## 2021-10-15 DIAGNOSIS — I679 Cerebrovascular disease, unspecified: Secondary | ICD-10-CM | POA: Diagnosis not present

## 2021-10-15 DIAGNOSIS — R202 Paresthesia of skin: Secondary | ICD-10-CM

## 2021-10-15 DIAGNOSIS — I639 Cerebral infarction, unspecified: Secondary | ICD-10-CM | POA: Diagnosis not present

## 2021-10-15 DIAGNOSIS — R270 Ataxia, unspecified: Secondary | ICD-10-CM

## 2021-10-15 DIAGNOSIS — I6990 Unspecified sequelae of unspecified cerebrovascular disease: Secondary | ICD-10-CM | POA: Diagnosis not present

## 2021-10-15 MED ORDER — APIXABAN 5 MG PO TABS: 5.0000 mg | ORAL_TABLET | Freq: Two times a day (BID) | ORAL | 1 refills | Status: DC

## 2021-10-15 MED ORDER — PREGABALIN 50 MG PO CAPS: 50.0000 mg | ORAL_CAPSULE | Freq: Two times a day (BID) | ORAL | 2 refills | Status: DC

## 2021-10-15 NOTE — Patient Instructions (Signed)
I had a long discussion the patient and his wife regarding his remote intracerebral hemorrhage of indeterminate etiology, atrial fibrillation/flutter and risk benefit of anticoagulation in patient with history of intracerebral hemorrhage.  Patient had done well on Eliquis but is now off it and considering Watchman procedure.  I will discuss this with Dr. Lalla Brothers but feel he should stay on Eliquis since he has done well on it. I recommend a trial of Lyrica 50 mg twice daily to be increased as tolerated without side effects for his poststroke paresthesias which appear to be bothersome and he did not benefit from trials of Topamax and gabapentin.  Continue strict blood pressure control with goal below 140/90.  He was advised to use his walker at all times and we discussed fall safety precautions.  Return for follow-up with me in 6 months or call earlier if necessary. ?Fall Prevention in the Home, Adult ?Falls can cause injuries and can happen to people of all ages. There are many things you can do to make your home safe and to help prevent falls. Ask for help when making these changes. ?What actions can I take to prevent falls? ?General Instructions ?Use good lighting in all rooms. Replace any light bulbs that burn out. ?Turn on the lights in dark areas. Use night-lights. ?Keep items that you use often in easy-to-reach places. Lower the shelves around your home if needed. ?Set up your furniture so you have a clear path. Avoid moving your furniture around. ?Do not have throw rugs or other things on the floor that can make you trip. ?Avoid walking on wet floors. ?If any of your floors are uneven, fix them. ?Add color or contrast paint or tape to clearly mark and help you see: ?Grab bars or handrails. ?First and last steps of staircases. ?Where the edge of each step is. ?If you use a stepladder: ?Make sure that it is fully opened. Do not climb a closed stepladder. ?Make sure the sides of the stepladder are locked in  place. ?Ask someone to hold the stepladder while you use it. ?Know where your pets are when moving through your home. ?What can I do in the bathroom? ? ?  ? ?Keep the floor dry. Clean up any water on the floor right away. ?Remove soap buildup in the tub or shower. ?Use nonskid mats or decals on the floor of the tub or shower. ?Attach bath mats securely with double-sided, nonslip rug tape. ?If you need to sit down in the shower, use a plastic, nonslip stool. ?Install grab bars by the toilet and in the tub and shower. Do not use towel bars as grab bars. ?What can I do in the bedroom? ?Make sure that you have a light by your bed that is easy to reach. ?Do not use any sheets or blankets for your bed that hang to the floor. ?Have a firm chair with side arms that you can use for support when you get dressed. ?What can I do in the kitchen? ?Clean up any spills right away. ?If you need to reach something above you, use a step stool with a grab bar. ?Keep electrical cords out of the way. ?Do not use floor polish or wax that makes floors slippery. ?What can I do with my stairs? ?Do not leave any items on the stairs. ?Make sure that you have a light switch at the top and the bottom of the stairs. ?Make sure that there are handrails on both sides of the stairs. Fix  handrails that are broken or loose. ?Install nonslip stair treads on all your stairs. ?Avoid having throw rugs at the top or bottom of the stairs. ?Choose a carpet that does not hide the edge of the steps on the stairs. ?Check carpeting to make sure that it is firmly attached to the stairs. Fix carpet that is loose or worn. ?What can I do on the outside of my home? ?Use bright outdoor lighting. ?Fix the edges of walkways and driveways and fix any cracks. ?Remove anything that might make you trip as you walk through a door, such as a raised step or threshold. ?Trim any bushes or trees on paths to your home. ?Check to see if handrails are loose or broken and that both  sides of all steps have handrails. ?Install guardrails along the edges of any raised decks and porches. ?Clear paths of anything that can make you trip, such as tools or rocks. ?Have leaves, snow, or ice cleared regularly. ?Use sand or salt on paths during winter. ?Clean up any spills in your garage right away. This includes grease or oil spills. ?What other actions can I take? ?Wear shoes that: ?Have a low heel. Do not wear high heels. ?Have rubber bottoms. ?Feel good on your feet and fit well. ?Are closed at the toe. Do not wear open-toe sandals. ?Use tools that help you move around if needed. These include: ?Canes. ?Walkers. ?Scooters. ?Crutches. ?Review your medicines with your doctor. Some medicines can make you feel dizzy. This can increase your chance of falling. ?Ask your doctor what else you can do to help prevent falls. ?Where to find more information ?Centers for Disease Control and Prevention, [employer]: FootballExhibition.com.br ?[employer] on Aging: https://walker.com/ ?Contact a doctor if: ?You are afraid of falling at home. ?You feel weak, drowsy, or dizzy at home. ?You fall at home. ?Summary ?There are many simple things that you can do to make your home safe and to help prevent falls. ?Ways to make your home safe include removing things that can make you trip and installing grab bars in the bathroom. ?Ask for help when making these changes in your home. ?This information is not intended to replace advice given to you by your health care provider. Make sure you discuss any questions you have with your health care provider. ?Document Revised: 03/19/2021 Document Reviewed: 01/19/2020 ?Elsevier Patient Education ? 2023 [employer] ? ?

## 2021-10-15 NOTE — Progress Notes (Signed)
?[facility] ?[address] ?[place]. [place] [address] ?[phone] ? ?     OFFICE FOLLOW-UP NOTE ? ?Walter [name] ?Date of Birth:  Feb 09, 1956 ?Medical Record Number:  [id number]  ? ?HPI: Initial visit 12/14/2020: [name] is a 66 year old pleasant Caucasian male seen today for initial office follow-up visit following hospital admission for intracerebral hemorrhage in March 2022.  History is obtained from  patient and his wife and review of electronic medical records and personally reviewed pertinent imaging films in PACS.  He has past medical history of atrial flutter and newly diagnosed A. fib in February 2022 not on anticoagulation who presented on 09/25/2020 upon awakening from sleep with discomfort in his head and ear and slurred speech.  Upon arrival he was drowsy and sleepy with blood pressure in the [id number] systolic range.  He required emergent intubation due to inability to protect his airway with a Glasgow Coma Scale of 5.  NIH stroke scale was 22.  He was also found to have right-sided weakness.  CT scan showed a left midbrain hemorrhage involving the dorsal part of the pons as well with extension into the cerebral aqueduct and fourth ventricle with local mass-effect on the brainstem but no hydrocephalus.  CT angiogram of the head and neck showed no large vessel occlusion or AV malformation.  ICH score was 3.  Patient was admitted to the intensive care unit.  He was started on hypertonic saline and had repeat CT scan which showed worsening hydrocephalus and neurosurgery was consulted who placed a right frontal ventriculostomy catheter.  MRI scan of the brain was obtained which confirmed acute hemorrhage within the left dorsal midbrain and pons extending to inferior medial thalamus with intraventricular extension.  There is no abnormal enhancement to suggest underlying structural or vascular lesion.  2D echo showed normal ejection fraction.  LDL cholesterol is 88 mg percent.   Hemoglobin A1c was 5.7.  Cerebral catheter angiogram was obtained and 09/27/2018 emergently which showed no evidence of AVM on formation, venous sinus thrombosis vascular abnormalities.  Patient showed neurological improvement after the EVD was placed and was following commands and opening his eyes.  The patient subsequent hospital course was complicated by development of deep vein thrombosis and pulmonary embolism eventually requiring to be started on anticoagulation.  He also required a tracheostomy and placement of IVC filter.  Long-term EEG monitoring showed triphasics but no definite seizure activity.  He was eventually weaned off ventilatory support and transferred to inpatient rehab on 10/26/2020 where he stayed for 4 weeks and made gradual improvement and was discharged home on 11/23/2020.  Patient was initially scheduled to see me in 2 months but the patient's wife had several questions about his follow-up care and I will prompting him to be seen sooner today.  Patient is currently at home he is getting home physical and occupational therapy.  He has significant truncal and hemiataxia and requires 1 person assist to even stand and can walk only using the safety belt and with the therapists.  Spent most of his time in the wheelchair.  His voice is very hypophonic.  He recently saw Dr. Lucia Gaskins ENT who did the fiberoptic endoscopy and did not find any significant abnormalities of the vocal cords.  He was complaining of significant fatigue and tiredness and Dr. Letta Pate recently added Provigil 100 mg in the morning.  Patient did have some placement sleep problems which appear to be more exacerbated in the last 2 weeks since starting Provigil.  He does take trazodone 50 mg at night but its not helping.  Patient has lot of questions about his timing and extent of recovery.  He is also read up on rehab trials as well as hyperbaric oxygen and has questions about using these.  He continues to have significant vertical  diplopia and is wearing an eye patch alternatingly on both eyes constantly.  He has seen Dr. Katy Fitch who plans to see him back in July and may prescribe prisms if diplopia persists. ?Update 04/16/2021 : He returns for follow-up after last visit 4 months ago.  He is accompanied by his wife.  Patient continues to improve with ongoing therapies.  He is now able to ambulate and uses a walker for long distances.  He unfortunately sprained his right ankle and is currently wearing a boot at this has set him back a little bit.  His voice has improved but diplopia persists.  He has seen Dr. Frederico Hamman neuro-ophthalmologist in [facility] who prescribed prisms but they do not seem to be working.  He is planning to see another ophthalmologist for a second opinion.  He did have repeat MRI scan done on 12/25/2020 which showed expected evolutionary changes in the left posterior midbrain hemorrhage with no under lying features to suggest vascular neoplastic lesion.  There was a tiny enhancing vessel at the bed of the hematoma which is of unclear significance.  There are also tiny chronic infarcts noted in the left frontal, parietal and bilateral cerebellar regions.  Patient has been taking Eliquis for his A. fib and tolerating it well without bleeding or bruising.  He has had no recurrent stroke or TIA symptoms.  He is also bothered by paresthesias in the right side of his body and is willing to try medications for it.  He can move around independently at home and in fact he has even been driving a bit. ?Update 10/15/2021 : He returns for follow-up after last visit 6 months ago.  He is accompanied by his wife.  He continues to have significant postop paresthesias on the right side as well as left body hemiataxia.  Uses a walker to walk most of the time but uses wheelchair for long distances.  Patient was tried on Topamax for his paresthesias but did not tolerated due to side effects and gabapentin was found to be not effective.  He has  noticed improvement in his voice.  Patient recently stopped Eliquis a month ago after his IVC filter was removed.  He has been referred by cardiology to electrophysiology and is being considered for the Watchman procedure.  He still has vertical diplopia but wears special prisms with tapes on the glasses and notices diplopia only when he looks up and when he is not wearing the prisms his blood pressure is well controlled today 126/74.  Patient's wife states that he does not want to take his medications the Remeron which seems to help him sleep well at night as well as Zoloft. ?ROS:   ?14 system review of systems is positive for weakness, imbalance, walking difficulty, depression , double vision ? ?PMH:  ?Past Medical History:  ?Diagnosis Date  ? Atrial flutter ([place])   ? s/p ablation  ? DVT (deep venous thrombosis) ([place])   ? Near syncope 09/25/2013  ? Paroxysmal atrial fibrillation (HCC)   ? Stroke [facility])   ? initially hemorragic (not on [place]) followed by subsequent embolic stroke  ? ? ?Social History:  ?Social History  ? ?Socioeconomic History  ?  Marital status: Married  ?  Spouse name: cathy  ? Number of children: Not on file  ? Years of education: Not on file  ? Highest education level: Not on file  ?Occupational History  ? Not on file  ?Tobacco Use  ? Smoking status: Never  ? Smokeless tobacco: Never  ?Vaping Use  ? Vaping Use: Never used  ?Substance and Sexual Activity  ? Alcohol use: Yes  ?  Comment: occasionally  ? Drug use: No  ? Sexual activity: Not on file  ?Other Topics Concern  ? Not on file  ?Social History Narrative  ? Lives with wife at home  ? Right Handed  ? Drinks decaf  ? ?Social Determinants of Health  ? ?Financial Resource Strain: Not on file  ?Food Insecurity: Not on file  ?Transportation Needs: Not on file  ?Physical Activity: Not on file  ?Stress: Not on file  ?Social Connections: Not on file  ?Intimate Partner Violence: Not on file  ? ? ?Medications:   ?Current Outpatient Medications on File  Prior to Visit  ?Medication Sig Dispense Refill  ? atorvastatin (LIPITOR) 20 MG tablet Take 1 tablet (20 mg total) by mouth at bedtime. 90 tablet 3  ? diltiazem (CARDIZEM CD) 120 MG 24 hr capsule Take 1 capsu

## 2021-10-16 ENCOUNTER — Other Ambulatory Visit: Payer: Self-pay | Admitting: *Deleted

## 2021-10-16 ENCOUNTER — Encounter: Payer: Self-pay | Admitting: Physical Therapy

## 2021-10-16 ENCOUNTER — Encounter: Payer: Self-pay | Admitting: Neurology

## 2021-10-16 ENCOUNTER — Ambulatory Visit: Payer: Medicare Other | Admitting: Occupational Therapy

## 2021-10-16 ENCOUNTER — Ambulatory Visit: Payer: Medicare Other | Admitting: Physical Therapy

## 2021-10-16 DIAGNOSIS — R278 Other lack of coordination: Secondary | ICD-10-CM

## 2021-10-16 DIAGNOSIS — R2681 Unsteadiness on feet: Secondary | ICD-10-CM

## 2021-10-16 DIAGNOSIS — R270 Ataxia, unspecified: Secondary | ICD-10-CM

## 2021-10-16 DIAGNOSIS — M6281 Muscle weakness (generalized): Secondary | ICD-10-CM

## 2021-10-16 DIAGNOSIS — R41842 Visuospatial deficit: Secondary | ICD-10-CM

## 2021-10-16 DIAGNOSIS — R41844 Frontal lobe and executive function deficit: Secondary | ICD-10-CM

## 2021-10-16 MED ORDER — PREGABALIN 50 MG PO CAPS: 50.0000 mg | ORAL_CAPSULE | Freq: Two times a day (BID) | ORAL | 5 refills | Status: DC

## 2021-10-16 NOTE — Therapy (Signed)
?OUTPATIENT OCCUPATIONAL THERAPY TREATMENT NOTE ? ? ?Patient Name: Walter [name] ?MRN: [id number] ?DOB:16-Jan-1956, 66 y.o., male ?Today's Date: 10/16/2021 ? ?PCP: Clovis Riley, L.August Saucer, MD ?REFERRING PROVIDER: Clovis Riley, L.August Saucer, MD ? ? OT End of Session - 10/16/21 1539   ? ? Visit Number [id number]   ? Number of Visits 25   ? Date for OT Re-Evaluation 11/28/21   ? Authorization Type Medicare Part A & B   ? Progress Note Due on Visit 10   ? OT Start Time 1450   ? OT Stop Time 1530   ? OT Time Calculation (min) 40 min   ? Activity Tolerance Patient tolerated treatment well   ? Behavior During Therapy [facility] for tasks assessed/performed   ? ?  ?  ? ?  ? ? ?Past Medical History:  ?Diagnosis Date  ? Atrial flutter (HCC)   ? s/p ablation  ? DVT (deep venous thrombosis) (HCC)   ? Near syncope 09/25/2013  ? Paroxysmal atrial fibrillation (HCC)   ? Stroke [facility])   ? initially hemorragic (not on OAC) followed by subsequent embolic stroke  ? ?Past Surgical History:  ?Procedure Laterality Date  ? A-FLUTTER ABLATION N/A 03/09/2020  ? Procedure: A-FLUTTER ABLATION;  Surgeon: Hillis Range, MD;  Location: [facility] INVASIVE CV LAB;  Service: Cardiovascular;  Laterality: N/A;  ? CARDIOVERSION N/A 09/27/2013  ? Procedure: CARDIOVERSION;  Surgeon: Lewayne Bunting, MD;  Location: [facility] ENDOSCOPY;  Service: Cardiovascular;  Laterality: N/A;  ? IR ANGIO EXTERNAL CAROTID SEL EXT CAROTID UNI R MOD SED  09/26/2020  ? IR ANGIO INTRA EXTRACRAN SEL COM CAROTID INNOMINATE BILAT MOD SED  09/26/2020  ? IR ANGIO VERTEBRAL SEL VERTEBRAL UNI R MOD SED  09/26/2020  ? IR IVC FILTER PLMT / S&I /IMG GUID/MOD SED  10/08/2020  ? IR IVC FILTER RETRIEVAL / S&I /IMG GUID/MOD SED  09/28/2021  ? IR IVUS EACH ADDITIONAL NON CORONARY VESSEL  11/02/2020  ? IR PTA VENOUS EXCEPT DIALYSIS CIRCUIT  11/02/2020  ? IR RADIOLOGIST EVAL & MGMT  01/17/2021  ? IR RADIOLOGIST EVAL & MGMT  09/12/2021  ? IR THROMBECT VENO MECH MOD SED  10/27/2020  ? IR THROMBECT VENO MECH MOD SED  11/02/2020  ? IR US GUIDE VASC ACCESS  LEFT  10/27/2020  ? IR US GUIDE VASC ACCESS RIGHT  09/26/2020  ? IR US GUIDE VASC ACCESS RIGHT  11/02/2020  ? IR VENO/EXT/UNI RIGHT  11/02/2020  ? IR VENOCAVAGRAM IVC  10/27/2020  ? RETINAL DETACHMENT SURGERY    ? right knee arthroscopy    ? TEE WITHOUT CARDIOVERSION N/A 09/27/2013  ? Procedure: TRANSESOPHAGEAL ECHOCARDIOGRAM (TEE);  Surgeon: Lewayne Bunting, MD;  Location: [facility] ENDOSCOPY;  Service: Cardiovascular;  Laterality: N/A;  ? WRIST SURGERY    ? ?Patient Active Problem List  ? Diagnosis Date Noted  ? Hypertension 08/16/2021  ? 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 Residual cognitive deficit as late effect of stroke 04/19/2021  ? Hemiparesis affecting right side as late effect of stroke (HCC) 04/19/2021  ? 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 (HCC) 04/19/2021  ? Thrombosis   ? Sleep disturbance   ? Dysphagia, post-stroke   ? PAF (paroxysmal atrial fibrillation) (HCC)   ? Acute pulmonary embolism without acute cor pulmonale (HCC)   ? Malnutrition of moderate degree 11/04/2020  ? Tracheostomy care [facility])   ? Pressure injury of skin 10/23/2020  ? Intracranial hemorrhage (HCC)   ? Atrial fibrillation (HCC)   ?  Prediabetes   ? Leukocytosis   ? Acute blood loss anemia   ? Hypernatremia   ? ICH (intracerebral hemorrhage) (HCC) 09/25/2020  ? Unilateral primary osteoarthritis, left knee 09/26/2016  ? Atrial flutter (HCC) 09/25/2013  ? Near syncope 09/25/2013  ? ? ?ONSET DATE: 09/17/20 ? ?REFERRING DIAG: I62.9 (ICD-10-CM) - Intracranial hemorrhage (HCC) ? ?THERAPY DIAG:  ?Ataxia ? ?Unsteadiness on feet ? ?Visuospatial deficit ? ?Other lack of coordination ? ?Muscle weakness (generalized) ? ?Frontal lobe and executive function deficit ? ? ?PERTINENT HISTORY: ICH of brainstem (complicated by hydrocephalus, DVTs, and tracheostomy).  PMH:  a-fib  ? ?PRECAUTIONS: fall risk, ataxia, impulsivity, diplopia  ? ? ?SUBJECTIVE: Pt arrived with USTEP walker  today. ? ?PAIN:  ?Are you having pain? No ? ? ? ? ?OBJECTIVE:  ? ?TODAY'S TREATMENT:  ?10/16/21 ?Patient indicates that he works out with an Glass blower/designer in his home weekly.  Worked on dynamic stand balance, and transitional movements - sit to stand, sit to prone, to plank, to 4point.  Patient lacks wrist range to tolerate 4 point or extended arm plank.  Balance consistently improves with slower speeds - better control of ataxic/ballistic movements.   ?Rescheduled for pool visit - 10/22/21 ? ?10/10/21 ? ?Large Pegboard for emphasis on LUE coordination increase shoulder stability with vertical surface and visual scanning for placing pegs and following up to board.  Pt with max difficulty and max drops. ? ?Standing with vertical surface writing on mirror with tic tac toe and writing name and connect the dots for visual scanning, standing balance and increased shoulder stability, and coordination. ? ?Reaching  for visual scanning and coordination with LUE  ? ?Arm Bike: for conditioning and reciprocal movements on Level 1 for 6 minutes (forward and backward) ? ? ? ? ? ? ? ? ? OT Short Term Goals - 09/19/21 1424   ? ?  ? OT SHORT TERM GOAL #1  ? Title Pt will be independent with HEP for LUE coordination   ? Time 4   ? Period Weeks   ? Status Achieved   reviewed coordination HEP 09/19/21  ? Target Date 10/03/21   ?  ? OT SHORT TERM GOAL #2  ? Title Pt will verbalize understanding of sensory strategies for RUE sensory deficits.   ? Time 4   ? Period Weeks   ? Status Achieved pt reports testing with LUE first for temperature. 09/24/21  ?  ? OT SHORT TERM GOAL #3  ? Title Pt will improve LUE coordination for ADLs as shown by improving score on box and blocks test by at least 6.   ? Baseline L 18 R 39   ? Time 4   ? Period Weeks   ? Status Achieved  LUE 22 blocks   ? ?  ?  ? ?  ? ? ? OT Long Term Goals - 09/05/21 1459   ? ?  ? OT LONG TERM GOAL #1  ? Title Pt will be independent with Aquatics HEP PRN   ? Time 12   ?  Period Weeks   ? Status New   ? Target Date 11/28/21   ?  ? OT LONG TERM GOAL #2  ? Title Pt will improve LUE hand coordination for ADLs as shown by completing 9-hole peg test in less than 90 seconds   ? Baseline 5 pegs in 2 minutes with several drops and knocking pegs out   ? Time 12   ? Period Weeks   ?  Status Ongoing  Working on coordination  ?  ? OT LONG TERM GOAL #3  ? Title Pt will verbalize understanding of visual compensation strategies and functional scanning strategies for increasing safety with ADLs and IADLs.   ? Time 12   ? Period Weeks   ? Status Ongoing  Continue progress  ?  ? OT LONG TERM GOAL #4  ? Title Pt will perform environmental scanning with 90% accuracy for increased safety.  ?Time 12   ?Period Weeks   ?Status Ongoing  ?  ?  ?  ? OT LONG TERM GOAL #5  ? Title Pt will demo good safety awareness/compensation for sensory deficits and attention to LUE during functional tasks and transfers without cueing.   ? Time 12   ? Period Weeks   ? Status Ongoing  ?  ? OT LONG TERM GOAL #6  ? Title Pt will report increased ease and accuracy with handwriting and texting with visual strategies and compensations as needed.   ? Time 12   ? Period Weeks   ? Status Ongoing   ? ?  ?  ? ?  ? ? ? Plan - 09/24/21 1354   ? ? Clinical Impression Statement Patient feels hybrid OT program is effective - will return to pool next week.  Pt is completing hybrid OT program in which he will complete OT in aquatic environment x1, and on land x1 to help address balance, coordiantion, and postural control as related to ADL/IADL.     ? OT Occupational Profile and History Detailed Assessment- Review of Records and additional review of physical, cognitive, psychosocial history related to current functional performance   ? Occupational performance deficits (Please refer to evaluation for details): ADL's;IADL's;Work;Leisure;Social Participation   ? Body Structure / Function / Physical Skills ADL;Strength;Balance;Proprioception;UE  functional use;IADL;Endurance;Vision;Mobility;Coordination;Decreased knowledge of precautions;FMC;GMC;Sensation   ? Cognitive Skills Attention;Safety Awareness;Memory;Perception;Problem Solve   ? Rehab [facility]

## 2021-10-16 NOTE — Telephone Encounter (Signed)
PA for pregabalin 50mg , one cap BID started on covermymeds (key: [id number]). Pharmacy coverage through [place] [phone]). Approved through 10/16/2022. Pharmacy will need new rx.  ?

## 2021-10-16 NOTE — Therapy (Signed)
?OUTPATIENT PHYSICAL THERAPY TREATMENT NOTE ? ? ?Patient Name: Walter [name] ?MRN: [id number] ?DOB:02/08/1956, 66 y.o., male ?Today's Date: 10/17/2021 ? ?PCP: [name], L.Dean, MD ?REFERRING PROVIDER: [name], L.Dean, MD ? ? PT End of Session - 10/16/21 1539   ? ? Visit Number 6   ? Number of Visits 9   ? Date for PT Re-Evaluation 11/02/21   ? Authorization Type Medicare so 10th visit progress note; KX modifier needed   ? PT Start Time 1535   ? PT Stop Time 1615   ? PT Time Calculation (min) 40 min   ? Equipment Utilized During Treatment Gait belt   ? Activity Tolerance Patient tolerated treatment well   ? Behavior During Therapy Impulsive;WFL for tasks assessed/performed   ? ?  ?  ? ?  ? ? ? ?Past Medical History:  ?Diagnosis Date  ? Atrial flutter (HCC)   ? s/p ablation  ? DVT (deep venous thrombosis) (HCC)   ? Near syncope 09/25/2013  ? Paroxysmal atrial fibrillation (HCC)   ? Stroke (HCC)   ? initially hemorragic (not on OAC) followed by subsequent embolic stroke  ? ?Past Surgical History:  ?Procedure Laterality Date  ? A-FLUTTER ABLATION N/A 03/09/2020  ? Procedure: A-FLUTTER ABLATION;  Surgeon: Allred, James, MD;  [facility]: [facility];  Service: Cardiovascular;  Laterality: N/A;  ? CARDIOVERSION N/A 09/27/2013  ? Procedure: CARDIOVERSION;  Surgeon: Brian S Crenshaw, MD;  Location: [facility]OS[facility];  Service: Cardiovascular;  Laterality: N/A;  ? IR ANGIO EXTERNAL CAROTID SEL EXT CAROTID UNI R MOD SED  09/26/2020  ? IR ANGIO INTRA EXTRACRAN SEL COM CAROTID INNOMINATE BILAT MOD SED  09/26/2020  ? IR ANGIO VERTEBRAL SEL VERTEBRAL UNI R MOD SED  09/26/2020  ? IR IVC FILTER PLMT / S&I /IMG GUID/MOD SED  10/08/2020  ? IR IVC FILTER RETRIEVAL / S&I /IMG GUID/MOD SED  09/28/2021  ? IR IVUS EACH ADDITIONAL NON CORONARY VESSEL  11/02/2020  ? IR PTA VENOUS EXCEPT DIALYSIS CIRCUIT  11/02/2020  ? IR RADIOLOGIST EVAL & MGMT  01/17/2021  ? IR RADIOLOGIST EVAL & MGMT  09/12/2021  ? IR THROMBECT VENO MECH MOD SED  10/27/2020  ? IR  THROMBECT VENO MECH MOD SED  11/02/2020  ? IR US GUIDE VASC ACCESS LEFT  10/27/2020  ? IR US GUIDE VASC ACCESS RIGHT  09/26/2020  ? IR US GUIDE VASC ACCESS RIGHT  11/02/2020  ? IR VENO/EXT/UNI RIGHT  11/02/2020  ? IR VENOCAVAGRAM IVC  10/27/2020  ? RETINAL DETACHMENT SURGERY    ? right knee arthroscopy    ? TEE WITHOUT CARDIOVERSION N/A 09/27/2013  ? Procedure: TRANSESOPHAGEAL ECHOCARDIOGRAM (TEE);  Surgeon: Brian S Crenshaw, MD;  Location: [facility];  Service: Cardiovascular;  Laterality: N/A;  ? WRIST SURGERY    ? ?Patient Active Problem List  ? Diagnosis Date Noted  ? Hypertension 08/16/2021  ? 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 Residual cognitive deficit as late effect of stroke 04/19/2021  ? Hemiparesis affecting right side as late effect of stroke (HCC) 04/19/2021  ? 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 (HCC) 04/19/2021  ? Thrombosis   ? Sleep disturbance   ? Dysphagia, post-stroke   ? PAF (paroxysmal atrial fibrillation) (HCC)   ? Acute pulmonary embolism without acute cor pulmonale (HCC)   ? Malnutrition of moderate degree 11/04/2020  ? Tracheostomy care (HCC)   ? Pressure injury of skin 10/23/2020  ? Intracranial hemorrhage (HCC)   ?   Atrial fibrillation ([place])   ? Prediabetes   ? Leukocytosis   ? Acute blood loss anemia   ? Hypernatremia   ? ICH (intracerebral hemorrhage) ([place]) 09/25/2020  ? Unilateral primary osteoarthritis, left knee 09/26/2016  ? Atrial flutter ([place]) 09/25/2013  ? Near syncope 09/25/2013  ? ? ?ONSET DATE: 08/29/2021 (CVA was 09/25/20) ?  ?REFERRING DIAG: I62.9 (ICD-10-CM) - Intracranial hemorrhage  ? ?THERAPY DIAG:  ?Muscle weakness (generalized) ? ?Unsteadiness on feet ? ?PERTINENT HISTORY: atrial flutter s/p ablation, DVT with IVC filter, near syncope, CVA- Brainstem bleed involving the left midbrain, pons as well as left thalamus. 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 ?PRECAUTIONS: Fall  ? ?SUBJECTIVE: No new complaints. No falls or pain to report. Reports they discovered at home the Ustep walker can be adjusted to create resistance on wheels so they do not get away from him as easily. Does feel that has helped. Using standard RW today. Will bring Ustep next session to see if adjustment has made a difference in safety with use of Ustep walker.  ? ?PAIN:  ?Are you having pain? No ? ?  ? ?TODAY'S TREATMENT:  ? SELF CARE: ?Discussed HEP pt is currently performing. Pt does endorse not performing them much at home due to having therapy 2-3 days a week. Does report he can start doing them more on off days and weekends. No issues with them when he does do them. ? ?STRENGTHENING   ?Completed SciFit with BLE/BUE on level 5.0 x 8 minutes with goal >/= 85 steps per minute for strengthening and activity tolerance.  ?  ?GAIT: ?Gait pattern: step through pattern, decreased stride length, ataxic, and trunk flexed ?Distance walked: 120 x 1, plus around clinic with session ?Assistive device utilized: Environmental consultant - 2 wheeled ?Level of assistance:  min guard to min assist ?Comments: cues to slow down and focus on gait so not to kick side of walker and for improved balance.  ? ?CURB:  ?Level of Assistance:  min guard assist for safety ?Assistive device utilized: Environmental consultant - 2 wheeled ?Curb Comments: cues to slow down and on stance position with walker advancement.  ? ?BALANCE/NMR: ?Seated on green air disc for the following activities: with 2# weighted ball for UE raises, then diagonals with emphasis on trunk rotation for ~8-10 reps each. Cues to keep trunk straight and back due to forward flexion, increased as pt fatigued. With arms on mat back behind hips- alternating long arc quads and marching for 10 reps each/each side with cues for form and to slow down. With hands on mat next to pt: alternating forward foot taps to color discs ?Standing with bil UE support on  PTA: working on stepping on strategies forward/backward with pt using PTA feet as guide to "move when I do" with improved stepping placement noted when pt slows down, min assist for balance. Then worked on alternating lateral stepping out<>back in with use of color dots as targets and pt still "move when I move" mirroring PTA steps with cues to slow down and "stay" with PTA with stepping. Min assist for balance.  ? ? ?PATIENT EDUCATION: ?Education details: need for more  ?Person educated: Patient ?Education method: Explanation ?Education comprehension: verbalized understanding ?  ?  ?HOME EXERCISE PROGRAM: ?Access Code: [email or web address] ?  ? ?  ?  ?GOALS: ?Goals reviewed with patient? Yes ?  ?SHORT TERM GOALS: All goals due 10/04/2021 ?  ?  Pt will be able to perform initial HEP for core stabilization, balance to continue gains on own with supervision of family.  ?Baseline: 10/16/21: pt has an HEP. Does not perform consistently.  ?Target date:- ?Goal status: Not Met ?  ?2.  Pt will be able to ambulate up/down curb with FWW supervision to be able to safely enter new home with 2 steps to enter. ?Baseline: 10/16/21: min guard assist with walker  ?Target date: - ?Goal status: Partially met ?  ?3.  Merrilee Jansky will be performed and LTG updated ?Baseline: Assessed 09/19/2021 ?Target date:  ?Goal status: MET ?  ?  ?  ?LONG TERM GOALS: ALL goals due 11/02/2021 ?  ?Pt will be able to perform progressive HEP for balance, core strengthening and aerobic activity to continue gains on own with assistance of family/caregiver. (LTGs due 11/02/21) ?  ?Baseline:  ?Target date:  11/02/21 ?Goal status: INITIAL ?  ?2.  Pt will increase gait speed from 0.87ms to >0.631m for improved community mobility. ?Baseline: 0.5735mon 09/05/21 ?Target date:  11/02/21 ?Goal status: INITIAL ?  ?3.  Pt will increase BERG balance score to 31/56 to demonstrate improved static balance. ?Baseline: 28/56 ?Target date:  11/02/21 ?Goal status: INITIAL ?  ?4.  Pt will ambulate >400' with RW  supervision for improved short community distances and activity tolerance. ?Baseline:  ?Target date:  11/02/21 ?Goal status: INITIAL ?  ?5.  Pt will decrease 5 x sit to stand from 13.85 sec to <12 sec for improve

## 2021-10-19 ENCOUNTER — Ambulatory Visit (INDEPENDENT_AMBULATORY_CARE_PROVIDER_SITE_OTHER): Payer: Medicare Other | Admitting: Cardiology

## 2021-10-19 ENCOUNTER — Encounter: Payer: Self-pay | Admitting: Cardiology

## 2021-10-19 ENCOUNTER — Ambulatory Visit (HOSPITAL_COMMUNITY): Payer: Medicare Other | Attending: Internal Medicine

## 2021-10-19 ENCOUNTER — Telehealth (HOSPITAL_COMMUNITY): Payer: Self-pay | Admitting: *Deleted

## 2021-10-19 VITALS — BP 108/60 | HR 83 | Ht 72.0 in | Wt 231.0 lb

## 2021-10-19 DIAGNOSIS — I483 Typical atrial flutter: Secondary | ICD-10-CM

## 2021-10-19 DIAGNOSIS — I629 Nontraumatic intracranial hemorrhage, unspecified: Secondary | ICD-10-CM

## 2021-10-19 DIAGNOSIS — Z01818 Encounter for other preprocedural examination: Secondary | ICD-10-CM | POA: Diagnosis present

## 2021-10-19 DIAGNOSIS — I480 Paroxysmal atrial fibrillation: Secondary | ICD-10-CM

## 2021-10-19 DIAGNOSIS — I10 Essential (primary) hypertension: Secondary | ICD-10-CM | POA: Diagnosis present

## 2021-10-19 DIAGNOSIS — I639 Cerebral infarction, unspecified: Secondary | ICD-10-CM | POA: Diagnosis present

## 2021-10-19 LAB — ECHOCARDIOGRAM COMPLETE
Area-P 1/2: 3.63 cm2
S' Lateral: 3.3 cm

## 2021-10-19 NOTE — Telephone Encounter (Signed)
Attempted to call patient regarding upcoming cardiac CT appointment. °Left message on voicemail with name and callback number ° °Eulanda Dorion RN Navigator Cardiac Imaging °[facility] °[phone] Office °[phone] Cell ° °

## 2021-10-19 NOTE — Patient Instructions (Addendum)
Medication Instructions:  ?Your physician recommends that you continue on your current medications as directed. Please refer to the Current Medication list given to you today. ?*If you need a refill on your cardiac medications before your next appointment, please call your pharmacy* ? ?Lab Work: ?None. ?If you have labs (blood work) drawn today and your tests are completely normal, you will receive your results only by: ?MyChart Message (if you have MyChart) OR ?A paper copy in the mail ?If you have any lab test that is abnormal or we need to change your treatment, we will call you to review the results. ? ?Testing/Procedures: ?None. ? ?Follow-Up: ?At [facility], you and your health needs are our priority.  As part of our continuing mission to provide you with exceptional heart care, we have created designated Provider Care Teams.  These Care Teams include your primary Cardiologist (physician) and Advanced Practice Providers (APPs -  Physician Assistants and Nurse Practitioners) who all work together to provide you with the care you need, when you need it. ? ?Your physician wants you to follow-up in: Patient to call back when you are ready to proceed with watchman evaluation.  ? ?We recommend signing up for the patient portal called "MyChart".  Sign up information is provided on this After Visit Summary.  MyChart is used to connect with patients for Virtual Visits (Telemedicine).  Patients are able to view lab/test results, encounter notes, upcoming appointments, etc.  Non-urgent messages can be sent to your provider as well.   ?To learn more about what you can do with MyChart, go to [email or web address].   ? ?Any Other Special Instructions Will Be Listed Below (If Applicable). ? ? ? ? ?  ? ? ?

## 2021-10-19 NOTE — Progress Notes (Signed)
?Electrophysiology Office Follow up Visit Note:   ? ?Date:  10/20/2021  ? ?ID:  Walter [name], DOB 09-25-1955, MRN [id number] ? ?PCP:  Alroy Dust, L.Marlou Sa, MD  ? ?[facility] Electrophysiologist:  [name], MD  ? ? ?Interval History:   ? ?Walter [name] is a 66 y.o. male who presents for a follow up visit. They were last seen in clinic October 05, 2021 for his history of atrial fibrillation and intracranial hemorrhage.  His intracranial hemorrhage occurred in the absence of anticoagulation.  He has subsequently had a cardioembolic stroke.  I was originally referred the patient by Dr. Lovena Le who is his primary electrophysiologist for consideration of watchman given his history of spontaneous intracranial hemorrhage and need for long-term anticoagulation. ? ?I originally saw the patient October 05, 2021 in consultation for possible watchman implant.  I thought he was a good candidate at that time.  He then saw Dr. Leonie Man from neurology.  The patient and his wife present to clinic today after their appointment with Dr. Leonie Man for continued discussions about left atrial appendage occlusion. ? ? ? ? ?  ? ?Past Medical History:  ?Diagnosis Date  ? Atrial flutter ([place])   ? s/p ablation  ? DVT (deep venous thrombosis) ([place])   ? Near syncope 09/25/2013  ? Paroxysmal atrial fibrillation (HCC)   ? Stroke [facility])   ? initially hemorragic (not on [place]) followed by subsequent embolic stroke  ? ? ?Past Surgical History:  ?Procedure Laterality Date  ? A-FLUTTER ABLATION N/A 03/09/2020  ? Procedure: A-FLUTTER ABLATION;  Surgeon: Thompson Grayer, MD;  Location: [place] CV LAB;  Service: Cardiovascular;  Laterality: N/A;  ? CARDIOVERSION N/A 09/27/2013  ? Procedure: CARDIOVERSION;  Surgeon: Lelon Perla, MD;  Location: [facility] ENDOSCOPY;  Service: Cardiovascular;  Laterality: N/A;  ? IR ANGIO EXTERNAL CAROTID SEL EXT CAROTID UNI R MOD SED  09/26/2020  ? IR ANGIO INTRA EXTRACRAN SEL COM CAROTID INNOMINATE BILAT MOD SED  09/26/2020  ? IR  ANGIO VERTEBRAL SEL VERTEBRAL UNI R MOD SED  09/26/2020  ? IR IVC FILTER PLMT / S&I /IMG GUID/MOD SED  10/08/2020  ? IR IVC FILTER RETRIEVAL / S&I /IMG GUID/MOD SED  09/28/2021  ? IR IVUS EACH ADDITIONAL NON CORONARY VESSEL  11/02/2020  ? IR PTA VENOUS EXCEPT DIALYSIS CIRCUIT  11/02/2020  ? IR RADIOLOGIST EVAL & MGMT  01/17/2021  ? IR RADIOLOGIST EVAL & MGMT  09/12/2021  ? IR THROMBECT VENO MECH MOD SED  10/27/2020  ? IR THROMBECT VENO MECH MOD SED  11/02/2020  ? IR US GUIDE VASC ACCESS LEFT  10/27/2020  ? IR US GUIDE VASC ACCESS RIGHT  09/26/2020  ? IR US GUIDE VASC ACCESS RIGHT  11/02/2020  ? IR VENO/EXT/UNI RIGHT  11/02/2020  ? IR VENOCAVAGRAM IVC  10/27/2020  ? RETINAL DETACHMENT SURGERY    ? right knee arthroscopy    ? TEE WITHOUT CARDIOVERSION N/A 09/27/2013  ? Procedure: TRANSESOPHAGEAL ECHOCARDIOGRAM (TEE);  Surgeon: Lelon Perla, MD;  Location: [facility] ENDOSCOPY;  Service: Cardiovascular;  Laterality: N/A;  ? WRIST SURGERY    ? ? ?Current Medications: ?Current Meds  ?Medication Sig  ? apixaban (ELIQUIS) 5 MG TABS tablet Take 1 tablet (5 mg total) by mouth 2 (two) times daily.  ? atorvastatin (LIPITOR) 20 MG tablet Take 1 tablet (20 mg total) by mouth at bedtime.  ? diltiazem (CARDIZEM CD) 120 MG 24 hr capsule Take 1 capsule (120 mg total) by mouth daily.  ?  EPINEPHrine 0.3 mg/0.3 mL IJ SOAJ injection Inject 0.3 mg into the muscle as needed for anaphylaxis.  ? mirtazapine (REMERON) 7.5 MG tablet TAKE 2 TABLETS BY MOUTH AT BEDTIME  ? pregabalin (LYRICA) 50 MG capsule Take 1 capsule (50 mg total) by mouth 2 (two) times daily.  ? sertraline (ZOLOFT) 100 MG tablet Take 100 mg by mouth daily.  ?  ? ?Allergies:   Keppra [levetiracetam] and Latex  ? ?Social History  ? ?Socioeconomic History  ? Marital status: Married  ?  Spouse name: cathy  ? Number of children: Not on file  ? Years of education: Not on file  ? Highest education level: Not on file  ?Occupational History  ? Not on file  ?Tobacco Use  ? Smoking status: Never  ? Smokeless  tobacco: Never  ?Vaping Use  ? Vaping Use: Never used  ?Substance and Sexual Activity  ? Alcohol use: Yes  ?  Comment: occasionally  ? Drug use: No  ? Sexual activity: Not on file  ?Other Topics Concern  ? Not on file  ?Social History Narrative  ? Lives with wife at home  ? Right Handed  ? Drinks decaf  ? ?Social Determinants of Health  ? ?Financial Resource Strain: Not on file  ?Food Insecurity: Not on file  ?Transportation Needs: Not on file  ?Physical Activity: Not on file  ?Stress: Not on file  ?Social Connections: Not on file  ?  ? ?Family History: ?The patient's family history includes Healthy in his brother and sister; Heart disease in his father and mother. ? ?ROS:   ?Please see the history of present illness.    ?All other systems reviewed and are negative. ? ?EKGs/Labs/Other Studies Reviewed:   ? ?The following studies were reviewed today: ? ? ? ? ? ?Recent Labs: ?10/29/2020: ALT 45 ?08/15/2021: Pro B Natriuretic peptide (BNP) 32.0; TSH 1.92 ?09/28/2021: BUN 10; Creatinine, Ser 0.91; Hemoglobin 15.3; Platelets 216; Potassium 4.1; Sodium 138  ?Recent Lipid Panel ?   ?Component Value Date/Time  ? CHOL 155 09/25/2020 1659  ? TRIG 117 09/25/2020 1659  ? HDL 44 09/25/2020 1659  ? CHOLHDL 3.5 09/25/2020 1659  ? VLDL 23 09/25/2020 1659  ? [place] 88 09/25/2020 1659  ? ? ?Physical Exam:   ? ?VS:  BP 108/60   Pulse 83   Ht 6' (1.829 m)   Wt 231 lb (104.8 kg) Comment: per patient :wheelchair"  SpO2 94%   BMI 31.33 kg/m?    ? ?Wt Readings from Last 3 Encounters:  ?10/19/21 231 lb (104.8 kg)  ?10/05/21 229 lb 9.6 oz (104.1 kg)  ?09/28/21 225 lb (102.1 kg)  ?  ? ?GEN:  Well nourished, well developed in no acute distress ?HEENT: Normal ?NECK: No JVD; No carotid bruits ?LYMPHATICS: No lymphadenopathy ?CARDIAC: RRR, no murmurs, rubs, gallops ?RESPIRATORY:  Clear to auscultation without rales, wheezing or rhonchi  ?ABDOMEN: Soft, non-tender, non-distended ?MUSCULOSKELETAL:  No edema; No deformity  ?SKIN: Warm and  dry ?NEUROLOGIC:  Alert and oriented x 3.  Stable neuro deficits from prior stroke. ?PSYCHIATRIC:  Normal affect  ? ? ? ?  ? ?ASSESSMENT:   ? ?1. PAF (paroxysmal atrial fibrillation) ([place])   ?2. Typical atrial flutter ([place])   ?3. Primary hypertension   ?4. Intracranial hemorrhage ([place])   ? ?PLAN:   ? ?In order of problems listed above: ? ? ?#Paroxysmal atrial fibrillation ?#Spontaneous intracranial hemorrhage ?We again reviewed his history of atrial fibrillation and the associated risk of embolic  stroke.  We discussed the risk of bleeding while on anticoagulation.  Given the patient's history of spontaneous intracranial hemorrhage, I do think he is at an elevated risk for significant bleeding while on anticoagulation.  I again discussed this with Dr. Leonie Man on the telephone yesterday.  Dr. Leonie Man is unclear as to why Mr. Claussen had his original intracranial hemorrhage.  Given the lack of clarity on the etiology of his original intracranial hemorrhage, I do think he is a very appropriate candidate for left atrial appendage occlusion as a stroke mitigation strategy.   ? ?I have asked the patient to think about the procedure at home with his family before making a final decision about how to proceed.  If they would like to proceed with watchman, they can reach out and we will get things scheduled for them.  In the interim, Dr. Leonie Man has recommended he restart Eliquis. ? ?--------------- ? ?I have seen Walter [name] in the office today who is being considered for a Watchman left atrial appendage closure device. I believe they will benefit from this procedure given their history of atrial fibrillation, CHA2DS2-VASc score of 4 and unadjusted ischemic stroke rate of 4.8% per year. Unfortunately, the patient is not felt to be a long term anticoagulation candidate secondary to history of hemorrhagic stroke. The patient's chart has been reviewed and I feel that they would be a candidate for short term oral anticoagulation  after Watchman implant.  ?  ?It is my belief that after undergoing a LAA closure procedure, Walter [name] will not need long term anticoagulation which eliminates anticoagulation side effects and major bleeding risk.

## 2021-10-22 ENCOUNTER — Ambulatory Visit (HOSPITAL_COMMUNITY): Admission: RE | Admit: 2021-10-22 | Payer: Medicare Other | Source: Ambulatory Visit

## 2021-10-22 ENCOUNTER — Ambulatory Visit: Payer: Medicare Other | Admitting: Occupational Therapy

## 2021-10-23 ENCOUNTER — Ambulatory Visit: Payer: Medicare Other

## 2021-10-23 ENCOUNTER — Encounter: Payer: Self-pay | Admitting: Occupational Therapy

## 2021-10-23 ENCOUNTER — Ambulatory Visit: Payer: Medicare Other | Admitting: Occupational Therapy

## 2021-10-23 DIAGNOSIS — R278 Other lack of coordination: Secondary | ICD-10-CM

## 2021-10-23 DIAGNOSIS — R270 Ataxia, unspecified: Secondary | ICD-10-CM

## 2021-10-23 DIAGNOSIS — R2689 Other abnormalities of gait and mobility: Secondary | ICD-10-CM

## 2021-10-23 DIAGNOSIS — M6281 Muscle weakness (generalized): Secondary | ICD-10-CM

## 2021-10-23 DIAGNOSIS — R2681 Unsteadiness on feet: Secondary | ICD-10-CM

## 2021-10-23 DIAGNOSIS — R41842 Visuospatial deficit: Secondary | ICD-10-CM

## 2021-10-23 NOTE — Therapy (Signed)
?OUTPATIENT OCCUPATIONAL THERAPY TREATMENT NOTE ? ? ?Patient Name: Walter [name] ?MRN: [id number] ?DOB:03/29/1956, 66 y.o., male ?Today's Date: 10/23/2021 ? ?PCP: Alroy Dust, L.Marlou Sa, MD ?REFERRING PROVIDER: Alroy Dust, L.Marlou Sa, MD ? ? OT End of Session - 10/23/21 1456   ? ? Visit Number 8   ? Number of Visits 25   ? Date for OT Re-Evaluation 11/28/21   ? Authorization Type Medicare Part A & B   ? Authorization Time Period --   ? Progress Note Due on Visit 10   ? OT Start Time [id number]   ? OT Stop Time 1530   ? OT Time Calculation (min) 45 min   ? Activity Tolerance Patient tolerated treatment well   ? Behavior During Therapy [facility] for tasks assessed/performed   ? ?  ?  ? ?  ? ? ?Past Medical History:  ?Diagnosis Date  ? Atrial flutter ([place])   ? s/p ablation  ? DVT (deep venous thrombosis) ([place])   ? Near syncope 09/25/2013  ? Paroxysmal atrial fibrillation (HCC)   ? Stroke [facility])   ? initially hemorragic (not on [place]) followed by subsequent embolic stroke  ? ?Past Surgical History:  ?Procedure Laterality Date  ? A-FLUTTER ABLATION N/A 03/09/2020  ? Procedure: A-FLUTTER ABLATION;  Surgeon: Thompson Grayer, MD;  Location: [place] CV LAB;  Service: Cardiovascular;  Laterality: N/A;  ? CARDIOVERSION N/A 09/27/2013  ? Procedure: CARDIOVERSION;  Surgeon: Lelon Perla, MD;  Location: [facility] ENDOSCOPY;  Service: Cardiovascular;  Laterality: N/A;  ? IR ANGIO EXTERNAL CAROTID SEL EXT CAROTID UNI R MOD SED  09/26/2020  ? IR ANGIO INTRA EXTRACRAN SEL COM CAROTID INNOMINATE BILAT MOD SED  09/26/2020  ? IR ANGIO VERTEBRAL SEL VERTEBRAL UNI R MOD SED  09/26/2020  ? IR IVC FILTER PLMT / S&I /IMG GUID/MOD SED  10/08/2020  ? IR IVC FILTER RETRIEVAL / S&I /IMG GUID/MOD SED  09/28/2021  ? IR IVUS EACH ADDITIONAL NON CORONARY VESSEL  11/02/2020  ? IR PTA VENOUS EXCEPT DIALYSIS CIRCUIT  11/02/2020  ? IR RADIOLOGIST EVAL & MGMT  01/17/2021  ? IR RADIOLOGIST EVAL & MGMT  09/12/2021  ? IR THROMBECT VENO MECH MOD SED  10/27/2020  ? IR THROMBECT VENO MECH MOD SED   11/02/2020  ? IR US GUIDE VASC ACCESS LEFT  10/27/2020  ? IR US GUIDE VASC ACCESS RIGHT  09/26/2020  ? IR US GUIDE VASC ACCESS RIGHT  11/02/2020  ? IR VENO/EXT/UNI RIGHT  11/02/2020  ? IR VENOCAVAGRAM IVC  10/27/2020  ? RETINAL DETACHMENT SURGERY    ? right knee arthroscopy    ? TEE WITHOUT CARDIOVERSION N/A 09/27/2013  ? Procedure: TRANSESOPHAGEAL ECHOCARDIOGRAM (TEE);  Surgeon: Lelon Perla, MD;  Location: [place];  Service: Cardiovascular;  Laterality: N/A;  ? WRIST SURGERY    ? ?Patient Active Problem List  ? Diagnosis Date Noted  ? Hypertension 08/16/2021  ? 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 Residual cognitive deficit as late effect of stroke 04/19/2021  ? Hemiparesis affecting right side as late effect of stroke ([place]) 04/19/2021  ? 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 ([place]) 04/19/2021  ? Thrombosis   ? Sleep disturbance   ? Dysphagia, post-stroke   ? PAF (paroxysmal atrial fibrillation) ([place])   ? Acute pulmonary embolism without acute cor pulmonale (HCC)   ? Malnutrition of moderate degree 11/04/2020  ? Tracheostomy care [facility])   ? Pressure injury of skin 10/23/2020  ? Intracranial hemorrhage (  [place])   ? Atrial fibrillation ([place])   ? Prediabetes   ? Leukocytosis   ? Acute blood loss anemia   ? Hypernatremia   ? ICH (intracerebral hemorrhage) ([place]) 09/25/2020  ? Unilateral primary osteoarthritis, left knee 09/26/2016  ? Atrial flutter ([place]) 09/25/2013  ? Near syncope 09/25/2013  ? ? ?ONSET DATE: 09/17/20 ? ?REFERRING DIAG: I62.9 (ICD-10-CM) - Intracranial hemorrhage ([place]) ? ?THERAPY DIAG:  ?Ataxia ? ?Visuospatial deficit ? ?Other lack of coordination ? ?Muscle weakness (generalized) ? ?Unsteadiness on feet ? ? ?PERTINENT HISTORY: ICH of brainstem (complicated by hydrocephalus, DVTs, and tracheostomy).  PMH:  a-fib  ? ?PRECAUTIONS: fall risk, ataxia, impulsivity, diplopia  ? ? ?SUBJECTIVE: Pt arrived with USTEP walker today. ? ?PAIN:   ?Are you having pain? No ? ? ? ? ?OBJECTIVE:  ? ?TODAY'S TREATMENT:  ?10/23/21 ?Patient stating that he would like this to be his last OT clinic appointment, but wants to continue in the pool.  He reports he is being discharged from PT/SLP and does not want to come for 45 min 1x/week for OT.   ?Reviewed pool schedule.   ?Worked today on whole body gross motor coordiantion.  Slowing movement, weight shifting onto left side - reaching up.  Working to encourage increased elongation of trunk on either side, maintain upright postureeven when arms active.  Alignment - hips forward, shoulders back - feet shoulder width apart - weight even between feet.   ?Patient with improved coordination with slower movements initially.   ? ?10/16/21 ?Patient indicates that he works out with an Development worker, international aid in his home weekly.  Worked on dynamic stand balance, and transitional movements - sit to stand, sit to prone, to plank, to 4point.  Patient lacks wrist range to tolerate 4 point or extended arm plank.  Balance consistently improves with slower speeds - better control of ataxic/ballistic movements.   ?Rescheduled for pool visit - 10/22/21 ? ? ? ? ? ? ? ? ? ? OT Short Term Goals - 09/19/21 1424   ? ?  ? OT SHORT TERM GOAL #1  ? Title Pt will be independent with HEP for LUE coordination   ? Time 4   ? Period Weeks   ? Status Achieved   reviewed coordination HEP 09/19/21  ? Target Date 10/03/21   ?  ? OT SHORT TERM GOAL #2  ? Title Pt will verbalize understanding of sensory strategies for RUE sensory deficits.   ? Time 4   ? Period Weeks   ? Status Achieved pt reports testing with LUE first for temperature. 09/24/21  ?  ? OT SHORT TERM GOAL #3  ? Title Pt will improve LUE coordination for ADLs as shown by improving score on box and blocks test by at least 6.   ? Baseline L 18 R 39   ? Time 4   ? Period Weeks   ? Status Achieved  LUE 22 blocks   ? ?  ?  ? ?  ? ? ? OT Long Term Goals - 09/05/21 1459   ? ?  ? OT LONG TERM GOAL #1   ? Title Pt will be independent with Aquatics HEP PRN   ? Time 12   ? Period Weeks   ? Status New   ? Target Date 11/28/21   ?  ? OT LONG TERM GOAL #2  ? Title Pt will improve LUE hand coordination for ADLs as shown by completing 9-hole peg test in less than 90 seconds   ?  Baseline 5 pegs in 2 minutes with several drops and knocking pegs out   ? Time 12   ? Period Weeks   ? Status Ongoing  Working on coordination  ?  ? OT LONG TERM GOAL #3  ? Title Pt will verbalize understanding of visual compensation strategies and functional scanning strategies for increasing safety with ADLs and IADLs.   ? Time 12   ? Period Weeks   ? Status Ongoing  Continue progress  ?  ? OT LONG TERM GOAL #4  ? Title Pt will perform environmental scanning with 90% accuracy for increased safety.  ?Time 12   ?Period Weeks   ?Status Ongoing  ?  ?  ?  ? OT LONG TERM GOAL #5  ? Title Pt will demo good safety awareness/compensation for sensory deficits and attention to LUE during functional tasks and transfers without cueing.   ? Time 12   ? Period Weeks   ? Status Ongoing  ?  ? OT LONG TERM GOAL #6  ? Title Pt will report increased ease and accuracy with handwriting and texting with visual strategies and compensations as needed.   ? Time 12   ? Period Weeks   ? Status Ongoing   ? ?  ?  ? ?  ? ? ? Plan - 09/24/21 1354   ? ? Clinical Impression Statement Patient feels hybrid OT program is effective - will return to pool next week.  Pt is completing hybrid OT program in which he will complete OT in aquatic environment x1, and on land x1 to help address balance, coordiantion, and postural control as related to ADL/IADL.     ? OT Occupational Profile and History Detailed Assessment- Review of Records and additional review of physical, cognitive, psychosocial history related to current functional performance   ? Occupational performance deficits (Please refer to evaluation for details): ADL's;IADL's;Work;Leisure;Social Participation   ? Body Structure /  Function / Physical Skills ADL;Strength;Balance;Proprioception;UE functional use;IADL;Endurance;Vision;Mobility;Coordination;Decreased knowledge of precautions;FMC;GMC;Sensation   ? Cognitive Skills Att

## 2021-10-23 NOTE — Therapy (Signed)
?OUTPATIENT PHYSICAL THERAPY TREATMENT NOTE ? ? ?Patient Name: Walter [name] ?MRN: [id number] ?DOB:Dec 30, 1955, 66 y.o., male ?Today's Date: 10/23/2021 ? ?PCP: Alroy Dust, L.Marlou Sa, MD ?REFERRING PROVIDER: Charlett Blake, MD ? ? PT End of Session - 10/23/21 1408   ? ? Visit Number 7   ? Number of Visits 9   ? Date for PT Re-Evaluation 11/02/21   ? Authorization Type Medicare so 10th visit progress note; KX modifier needed   ? PT Start Time 1405   ? PT Stop Time 0315   ? PT Time Calculation (min) 41 min   ? Equipment Utilized During Treatment Gait belt   ? Activity Tolerance Patient tolerated treatment well   ? Behavior During Therapy Impulsive;WFL for tasks assessed/performed   ? ?  ?  ? ?  ? ? ? ?Past Medical History:  ?Diagnosis Date  ? Atrial flutter ([place])   ? s/p ablation  ? DVT (deep venous thrombosis) ([place])   ? Near syncope 09/25/2013  ? Paroxysmal atrial fibrillation (HCC)   ? Stroke [facility])   ? initially hemorragic (not on [place]) followed by subsequent embolic stroke  ? ?Past Surgical History:  ?Procedure Laterality Date  ? A-FLUTTER ABLATION N/A 03/09/2020  ? Procedure: A-FLUTTER ABLATION;  Surgeon: Thompson Grayer, MD;  Location: [place] CV LAB;  Service: Cardiovascular;  Laterality: N/A;  ? CARDIOVERSION N/A 09/27/2013  ? Procedure: CARDIOVERSION;  Surgeon: Lelon Perla, MD;  Location: [facility] ENDOSCOPY;  Service: Cardiovascular;  Laterality: N/A;  ? IR ANGIO EXTERNAL CAROTID SEL EXT CAROTID UNI R MOD SED  09/26/2020  ? IR ANGIO INTRA EXTRACRAN SEL COM CAROTID INNOMINATE BILAT MOD SED  09/26/2020  ? IR ANGIO VERTEBRAL SEL VERTEBRAL UNI R MOD SED  09/26/2020  ? IR IVC FILTER PLMT / S&I /IMG GUID/MOD SED  10/08/2020  ? IR IVC FILTER RETRIEVAL / S&I /IMG GUID/MOD SED  09/28/2021  ? IR IVUS EACH ADDITIONAL NON CORONARY VESSEL  11/02/2020  ? IR PTA VENOUS EXCEPT DIALYSIS CIRCUIT  11/02/2020  ? IR RADIOLOGIST EVAL & MGMT  01/17/2021  ? IR RADIOLOGIST EVAL & MGMT  09/12/2021  ? IR THROMBECT VENO MECH MOD SED  10/27/2020  ? IR  THROMBECT VENO MECH MOD SED  11/02/2020  ? IR US GUIDE VASC ACCESS LEFT  10/27/2020  ? IR US GUIDE VASC ACCESS RIGHT  09/26/2020  ? IR US GUIDE VASC ACCESS RIGHT  11/02/2020  ? IR VENO/EXT/UNI RIGHT  11/02/2020  ? IR VENOCAVAGRAM IVC  10/27/2020  ? RETINAL DETACHMENT SURGERY    ? right knee arthroscopy    ? TEE WITHOUT CARDIOVERSION N/A 09/27/2013  ? Procedure: TRANSESOPHAGEAL ECHOCARDIOGRAM (TEE);  Surgeon: Lelon Perla, MD;  Location: [place];  Service: Cardiovascular;  Laterality: N/A;  ? WRIST SURGERY    ? ?Patient Active Problem List  ? Diagnosis Date Noted  ? Hypertension 08/16/2021  ? 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 Residual cognitive deficit as late effect of stroke 04/19/2021  ? Hemiparesis affecting right side as late effect of stroke ([place]) 04/19/2021  ? 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 ([place]) 04/19/2021  ? Thrombosis   ? Sleep disturbance   ? Dysphagia, post-stroke   ? PAF (paroxysmal atrial fibrillation) ([place])   ? Acute pulmonary embolism without acute cor pulmonale (HCC)   ? Malnutrition of moderate degree 11/04/2020  ? Tracheostomy care [facility])   ? Pressure injury of skin 10/23/2020  ? Intracranial hemorrhage ([place])   ?  Atrial fibrillation ([place])   ? Prediabetes   ? Leukocytosis   ? Acute blood loss anemia   ? Hypernatremia   ? ICH (intracerebral hemorrhage) ([place]) 09/25/2020  ? Unilateral primary osteoarthritis, left knee 09/26/2016  ? Atrial flutter ([place]) 09/25/2013  ? Near syncope 09/25/2013  ? ? ?ONSET DATE: 08/29/2021 (CVA was 09/25/20) ?  ?REFERRING DIAG: I62.9 (ICD-10-CM) - Intracranial hemorrhage  ? ?THERAPY DIAG:  ?Other abnormalities of gait and mobility ? ?Unsteadiness on feet ? ?PERTINENT HISTORY: atrial flutter s/p ablation, DVT with IVC filter, near syncope, CVA- Brainstem bleed involving the left midbrain, pons as well as left thalamus. 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 ?PRECAUTIONS: Fall  ? ?SUBJECTIVE: Pt reports that his whole situation just "sucks". Pt reports that 1x/week a girl comes in home that does an adaptive yoga.  ? ?PAIN:  ?Are you having pain? No ? ?  ? ?TODAY'S TREATMENT:  ?  ? [place] Adult PT Treatment/Exercise - 10/23/21 1419   ? ?  ? Transfers  ? Transfers Sit to Stand;Stand to Sit   ? Sit to Stand 4: Min guard   ? Five time sit to stand comments  16.3 sec from chair without hands. Cued to come all the way up.   ? Stand to Sit 4: Min guard   ?  ? Standardized Balance Assessment  ? Standardized Balance Assessment Berg Balance Test   ?  ? Berg Balance Test  ? Sit to Stand Able to stand  independently using hands   ? Standing Unsupported Able to stand 2 minutes with supervision   ? Sitting with Back Unsupported but Feet Supported on Floor or Stool Able to sit safely and securely 2 minutes   ? Stand to Sit Controls descent by using hands   ? Transfers Able to transfer with verbal cueing and /or supervision   ? Standing Unsupported with Eyes Closed Able to stand 10 seconds with supervision   ? Standing Ubsupported with Feet Together Needs help to attain position and unable to hold for 15 seconds   ? From Standing, Reach Forward with Outstretched Arm Can reach confidently >25 cm (10")   ? From Standing Position, Pick up Object from Floor Able to pick up shoe, needs supervision   ? From Standing Position, Turn to Look Behind Over each Shoulder Needs supervision when turning   ? Turn 360 Degrees Needs assistance while turning   ? Standing Unsupported, Alternately Place Feet on Step/Stool Able to complete >2 steps/needs minimal assist   ? Standing Unsupported, One Foot in Front Able to take small step independently and hold 30 seconds   ? Standing on One Leg Tries to lift leg/unable to hold 3 seconds but remains standing independently   ? Total Score 30   ? ?  ?  ? ?  ? ? ? ?GAIT: ?Gait pattern: step through pattern and  ataxic ?Distance walked: 38' ?Assistive device utilized: Environmental consultant - 2 wheeled ?Level of assistance: CGA and Min A ?Comments: Verbal cues to stay close to walker and slow down for better control. ? 10 m gait speed=17.55 sec=0.67ms ? ? ?PATIENT EDUCATION: ?Education details: results of testing and adding one more visit next week to review HEP for discharge. ?Person educated: Patient ?Education method: Explanation ?Education comprehension: verbalized understanding ?  ?  ?HOME EXERCISE PROGRAM: ?Access Code: [email or web address] ?  ? ?  ?  ?GOALS: ?Goals reviewed  with patient? Yes ?  ?SHORT TERM GOALS: All goals due 10/04/2021 ?  ?Pt will be able to perform initial HEP for core stabilization, balance to continue gains on own with supervision of family.  ?Baseline: 10/16/21: pt has an HEP. Does not perform consistently.  ?Target date:- ?Goal status: Not Met ?  ?2.  Pt will be able to ambulate up/down curb with FWW supervision to be able to safely enter new home with 2 steps to enter. ?Baseline: 10/16/21: min guard assist with walker  ?Target date: - ?Goal status: Partially met ?  ?3.  Merrilee Jansky will be performed and LTG updated ?Baseline: Assessed 09/19/2021 ?Target date:  ?Goal status: MET ?  ?  ?  ?LONG TERM GOALS: ALL goals due 11/02/2021 ?  ?Pt will be able to perform progressive HEP for balance, core strengthening and aerobic activity to continue gains on own with assistance of family/caregiver. (LTGs due 11/02/21) ?  ?Baseline:  ?Target date:  11/02/21 ?Goal status: INITIAL ?  ?2.  Pt will increase gait speed from 0.15ms to >0.664m for improved community mobility. ?Baseline: 0.5777mon 09/05/21. 10/23/21 0.15m2mTarget date:  11/02/21 ?Goal status: Not met ?  ?3.  Pt will increase BERG balance score to 31/56 to demonstrate improved static balance. ?Baseline: 28/56. 10/23/21 30/56 ?Target date:  11/02/21 ?Goal status: Partially met ?  ?4.  Pt will ambulate >400' with RW supervision for improved short community distances and activity  tolerance. ?Baseline: 10/23/21 460' with RW CGA/min assist ?Target date:  11/02/21 ?Goal status: Partially met- Met distance part but still requires CGA/min assist at times ?  ?5.  Pt will decrease 5 x sit to stand from 13.85 sec

## 2021-10-29 ENCOUNTER — Ambulatory Visit: Payer: Medicare Other | Attending: Family Medicine | Admitting: Occupational Therapy

## 2021-10-29 ENCOUNTER — Ambulatory Visit: Payer: Medicare Other | Admitting: Occupational Therapy

## 2021-10-29 ENCOUNTER — Encounter: Payer: Self-pay | Admitting: Occupational Therapy

## 2021-10-29 DIAGNOSIS — R41842 Visuospatial deficit: Secondary | ICD-10-CM | POA: Insufficient documentation

## 2021-10-29 DIAGNOSIS — R270 Ataxia, unspecified: Secondary | ICD-10-CM | POA: Diagnosis present

## 2021-10-29 DIAGNOSIS — M6281 Muscle weakness (generalized): Secondary | ICD-10-CM | POA: Diagnosis present

## 2021-10-29 DIAGNOSIS — R278 Other lack of coordination: Secondary | ICD-10-CM | POA: Insufficient documentation

## 2021-10-29 DIAGNOSIS — R2681 Unsteadiness on feet: Secondary | ICD-10-CM | POA: Insufficient documentation

## 2021-10-29 NOTE — Therapy (Signed)
?OUTPATIENT OCCUPATIONAL THERAPY TREATMENT NOTE ? ? ?Patient Name: Walter [name] ?MRN: [id number] ?DOB:1956/06/06, 66 y.o., male ?Today's Date: 10/29/2021 ? ?PCP: Alroy Dust, L.Marlou Sa, MD ?REFERRING PROVIDER: Alroy Dust, L.Marlou Sa, MD ? ? OT End of Session - 10/29/21 1449   ? ? Visit Number 9   ? Number of Visits 25   ? Date for OT Re-Evaluation 11/28/21   ? Authorization Type Medicare Part A & B   ? Authorization Time Period 14   ? Progress Note Due on Visit 10   ? Equipment Utilized During Therapist, occupational   ? Activity Tolerance Patient tolerated treatment well   ? Behavior During Therapy [facility] for tasks assessed/performed   ? ?  ?  ? ?  ? ? ?Past Medical History:  ?Diagnosis Date  ? Atrial flutter ([place])   ? s/p ablation  ? DVT (deep venous thrombosis) ([place])   ? Near syncope 09/25/2013  ? Paroxysmal atrial fibrillation (HCC)   ? Stroke [facility])   ? initially hemorragic (not on [place]) followed by subsequent embolic stroke  ? ?Past Surgical History:  ?Procedure Laterality Date  ? A-FLUTTER ABLATION N/A 03/09/2020  ? Procedure: A-FLUTTER ABLATION;  Surgeon: Thompson Grayer, MD;  Location: [place] CV LAB;  Service: Cardiovascular;  Laterality: N/A;  ? CARDIOVERSION N/A 09/27/2013  ? Procedure: CARDIOVERSION;  Surgeon: Lelon Perla, MD;  Location: [facility] ENDOSCOPY;  Service: Cardiovascular;  Laterality: N/A;  ? IR ANGIO EXTERNAL CAROTID SEL EXT CAROTID UNI R MOD SED  09/26/2020  ? IR ANGIO INTRA EXTRACRAN SEL COM CAROTID INNOMINATE BILAT MOD SED  09/26/2020  ? IR ANGIO VERTEBRAL SEL VERTEBRAL UNI R MOD SED  09/26/2020  ? IR IVC FILTER PLMT / S&I /IMG GUID/MOD SED  10/08/2020  ? IR IVC FILTER RETRIEVAL / S&I /IMG GUID/MOD SED  09/28/2021  ? IR IVUS EACH ADDITIONAL NON CORONARY VESSEL  11/02/2020  ? IR PTA VENOUS EXCEPT DIALYSIS CIRCUIT  11/02/2020  ? IR RADIOLOGIST EVAL & MGMT  01/17/2021  ? IR RADIOLOGIST EVAL & MGMT  09/12/2021  ? IR THROMBECT VENO MECH MOD SED  10/27/2020  ? IR THROMBECT VENO MECH MOD SED  11/02/2020  ? IR US GUIDE  VASC ACCESS LEFT  10/27/2020  ? IR US GUIDE VASC ACCESS RIGHT  09/26/2020  ? IR US GUIDE VASC ACCESS RIGHT  11/02/2020  ? IR VENO/EXT/UNI RIGHT  11/02/2020  ? IR VENOCAVAGRAM IVC  10/27/2020  ? RETINAL DETACHMENT SURGERY    ? right knee arthroscopy    ? TEE WITHOUT CARDIOVERSION N/A 09/27/2013  ? Procedure: TRANSESOPHAGEAL ECHOCARDIOGRAM (TEE);  Surgeon: Lelon Perla, MD;  Location: [place];  Service: Cardiovascular;  Laterality: N/A;  ? WRIST SURGERY    ? ?Patient Active Problem List  ? Diagnosis Date Noted  ? Hypertension 08/16/2021  ? 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 Residual cognitive deficit as late effect of stroke 04/19/2021  ? Hemiparesis affecting right side as late effect of stroke ([place]) 04/19/2021  ? 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 ([place]) 04/19/2021  ? Thrombosis   ? Sleep disturbance   ? Dysphagia, post-stroke   ? PAF (paroxysmal atrial fibrillation) ([place])   ? Acute pulmonary embolism without acute cor pulmonale (HCC)   ? Malnutrition of moderate degree 11/04/2020  ? Tracheostomy care [facility])   ? Pressure injury of skin 10/23/2020  ? Intracranial hemorrhage ([employer])   ? Atrial fibrillation ([place])   ? Prediabetes   ?  Leukocytosis   ? Acute blood loss anemia   ? Hypernatremia   ? ICH (intracerebral hemorrhage) ([place]) 09/25/2020  ? Unilateral primary osteoarthritis, left knee 09/26/2016  ? Atrial flutter ([place]) 09/25/2013  ? Near syncope 09/25/2013  ? ? ?ONSET DATE: 09/17/20 ? ?REFERRING DIAG: I62.9 (ICD-10-CM) - Intracranial hemorrhage ([place]) ? ?THERAPY DIAG:  ?Ataxia ? ?Unsteadiness on feet ? ?Other lack of coordination ? ?Muscle weakness (generalized) ? ?Visuospatial deficit ? ? ?PERTINENT HISTORY: ICH of brainstem (complicated by hydrocephalus, DVTs, and tracheostomy).  PMH:  a-fib  ? ?PRECAUTIONS: fall risk, ataxia, impulsivity, diplopia  ? ? ?SUBJECTIVE: Pt arrived with USTEP walker today. ? ?PAIN:  ?Are you having pain?  No ? ? ? ? ?OBJECTIVE:  ? ?TODAY'S TREATMENT:  ?10/29/21: ?Patient seen for aquatic therapy visit today.  Patient used both rails to enter pool  with intermittent min assist and cueing for postural control.  Worked on walking in waist and chest deep water.  Patient with decreased control and balance with attempts to slow walking.  Patient with optimal walking with increased demand - increased speed, and increased effort - pushing against resistance.  Patient enjoys moving faster and feels he is able to get heart rate up slightly - have to balance this with safety and coordination/balance.  Worked on preliminary Ai Chi exercises to work on static to [employer] - with less turbulence and then slowly increasing turbulence.  Worked on actual swimming - freestyle and breast stroke.  Patient with decreased postural control, and noted ataxia especially in LUE.  Patient able to safely swim across length of pool   ?Patient tolerated longer session, and was able to carryover concepts of alignment - chest up, hips forward and narrower base of support.   ? ?10/23/21 ?Patient stating that he would like this to be his last OT clinic appointment, but wants to continue in the pool.  He reports he is being discharged from PT/SLP and does not want to come for 45 min 1x/week for OT.   ?Reviewed pool schedule.   ?Worked today on whole body gross motor coordiantion.  Slowing movement, weight shifting onto left side - reaching up.  Working to encourage increased elongation of trunk on either side, maintain upright postureeven when arms active.  Alignment - hips forward, shoulders back - feet shoulder width apart - weight even between feet.   ?Patient with improved coordination with slower movements initially.   ? ?10/16/21 ?Patient indicates that he works out with an Development worker, international aid in his home weekly.  Worked on dynamic stand balance, and transitional movements - sit to stand, sit to prone, to plank, to 4point.  Patient  lacks wrist range to tolerate 4 point or extended arm plank.  Balance consistently improves with slower speeds - better control of ataxic/ballistic movements.   ?Rescheduled for pool visit - 10/22/21 ? ? ? ? ? ? ? ? ? ? OT Short Term Goals - 09/19/21 1424   ? ?  ? OT SHORT TERM GOAL #1  ? Title Pt will be independent with HEP for LUE coordination   ? Time 4   ? Period Weeks   ? Status Achieved   reviewed coordination HEP 09/19/21  ? Target Date 10/03/21   ?  ? OT SHORT TERM GOAL #2  ? Title Pt will verbalize understanding of sensory strategies for RUE sensory deficits.   ? Time 4   ? Period Weeks   ? Status Achieved pt reports testing with LUE first  for temperature. 09/24/21  ?  ? OT SHORT TERM GOAL #3  ? Title Pt will improve LUE coordination for ADLs as shown by improving score on box and blocks test by at least 6.   ? Baseline L 18 R 39   ? Time 4   ? Period Weeks   ? Status Achieved  LUE 22 blocks   ? ?  ?  ? ?  ? ? ? OT Long Term Goals - 09/05/21 1459   ? ?  ? OT LONG TERM GOAL #1  ? Title Pt will be independent with Aquatics HEP PRN   ? Time 12   ? Period Weeks   ? Status New   ? Target Date 11/28/21   ?  ? OT LONG TERM GOAL #2  ? Title Pt will improve LUE hand coordination for ADLs as shown by completing 9-hole peg test in less than 90 seconds   ? Baseline 5 pegs in 2 minutes with several drops and knocking pegs out   ? Time 12   ? Period Weeks   ? Status Ongoing  Working on coordination  ?  ? OT LONG TERM GOAL #3  ? Title Pt will verbalize understanding of visual compensation strategies and functional scanning strategies for increasing safety with ADLs and IADLs.   ? Time 12   ? Period Weeks   ? Status Ongoing  Continue progress  ?  ? OT LONG TERM GOAL #4  ? Title Pt will perform environmental scanning with 90% accuracy for increased safety.  ?Time 12   ?Period Weeks   ?Status Ongoing  ?  ?  ?  ? OT LONG TERM GOAL #5  ? Title Pt will demo good safety awareness/compensation for sensory deficits and attention to  LUE during functional tasks and transfers without cueing.   ? Time 12   ? Period Weeks   ? Status Ongoing  ?  ? OT LONG TERM GOAL #6  ? Title Pt will report increased ease and accuracy with handwriting an

## 2021-10-30 ENCOUNTER — Ambulatory Visit: Payer: Medicare Other

## 2021-10-30 ENCOUNTER — Ambulatory Visit: Payer: Medicare Other | Admitting: Occupational Therapy

## 2021-10-31 ENCOUNTER — Ambulatory Visit: Payer: Medicare Other

## 2021-11-05 ENCOUNTER — Ambulatory Visit: Payer: Medicare Other | Admitting: Occupational Therapy

## 2021-11-06 ENCOUNTER — Encounter: Payer: Medicare Other | Admitting: Occupational Therapy

## 2021-11-06 ENCOUNTER — Ambulatory Visit: Payer: Medicare Other

## 2021-11-12 ENCOUNTER — Encounter: Payer: Self-pay | Admitting: Occupational Therapy

## 2021-11-12 ENCOUNTER — Ambulatory Visit: Payer: Medicare Other | Admitting: Occupational Therapy

## 2021-11-12 DIAGNOSIS — R2681 Unsteadiness on feet: Secondary | ICD-10-CM

## 2021-11-12 DIAGNOSIS — R41842 Visuospatial deficit: Secondary | ICD-10-CM

## 2021-11-12 DIAGNOSIS — R270 Ataxia, unspecified: Secondary | ICD-10-CM | POA: Diagnosis not present

## 2021-11-12 DIAGNOSIS — R278 Other lack of coordination: Secondary | ICD-10-CM

## 2021-11-12 DIAGNOSIS — M6281 Muscle weakness (generalized): Secondary | ICD-10-CM

## 2021-11-12 NOTE — Therapy (Addendum)
?OUTPATIENT OCCUPATIONAL THERAPY TREATMENT AND PROGRESS NOTE ? ? ?Patient Name: Walter [name] ?MRN: [id number] ?DOB:10-Nov-1955, 66 y.o., male ?Today's Date: 11/12/2021 ? ?PCP: Alroy Dust, L.Marlou Sa, MD ?REFERRING PROVIDER: Alroy Dust, L.Marlou Sa, MD ? ? OT End of Session - 11/12/21 1751   ? ? Visit Number 10   ? Number of Visits 25   ? Date for OT Re-Evaluation 11/28/21   ? Authorization Type Medicare Part A & B   ? Progress Note Due on Visit 10   ? OT Start Time 1415   ? OT Stop Time 1500   ? OT Time Calculation (min) 45 min   ? Activity Tolerance Patient tolerated treatment well   ? Behavior During Therapy [facility] for tasks assessed/performed   ? ?  ?  ? ?  ? ? ?Past Medical History:  ?Diagnosis Date  ? Atrial flutter ([place])   ? s/p ablation  ? DVT (deep venous thrombosis) ([place])   ? Near syncope 09/25/2013  ? Paroxysmal atrial fibrillation (HCC)   ? Stroke [facility])   ? initially hemorragic (not on [place]) followed by subsequent embolic stroke  ? ?Past Surgical History:  ?Procedure Laterality Date  ? A-FLUTTER ABLATION N/A 03/09/2020  ? Procedure: A-FLUTTER ABLATION;  Surgeon: Thompson Grayer, MD;  Location: [place] CV LAB;  Service: Cardiovascular;  Laterality: N/A;  ? CARDIOVERSION N/A 09/27/2013  ? Procedure: CARDIOVERSION;  Surgeon: Lelon Perla, MD;  Location: [facility] ENDOSCOPY;  Service: Cardiovascular;  Laterality: N/A;  ? IR ANGIO EXTERNAL CAROTID SEL EXT CAROTID UNI R MOD SED  09/26/2020  ? IR ANGIO INTRA EXTRACRAN SEL COM CAROTID INNOMINATE BILAT MOD SED  09/26/2020  ? IR ANGIO VERTEBRAL SEL VERTEBRAL UNI R MOD SED  09/26/2020  ? IR IVC FILTER PLMT / S&I /IMG GUID/MOD SED  10/08/2020  ? IR IVC FILTER RETRIEVAL / S&I /IMG GUID/MOD SED  09/28/2021  ? IR IVUS EACH ADDITIONAL NON CORONARY VESSEL  11/02/2020  ? IR PTA VENOUS EXCEPT DIALYSIS CIRCUIT  11/02/2020  ? IR RADIOLOGIST EVAL & MGMT  01/17/2021  ? IR RADIOLOGIST EVAL & MGMT  09/12/2021  ? IR THROMBECT VENO MECH MOD SED  10/27/2020  ? IR THROMBECT VENO MECH MOD SED  11/02/2020  ? IR US GUIDE  VASC ACCESS LEFT  10/27/2020  ? IR US GUIDE VASC ACCESS RIGHT  09/26/2020  ? IR US GUIDE VASC ACCESS RIGHT  11/02/2020  ? IR VENO/EXT/UNI RIGHT  11/02/2020  ? IR VENOCAVAGRAM IVC  10/27/2020  ? RETINAL DETACHMENT SURGERY    ? right knee arthroscopy    ? TEE WITHOUT CARDIOVERSION N/A 09/27/2013  ? Procedure: TRANSESOPHAGEAL ECHOCARDIOGRAM (TEE);  Surgeon: Lelon Perla, MD;  Location: [place];  Service: Cardiovascular;  Laterality: N/A;  ? WRIST SURGERY    ? ?Patient Active Problem List  ? Diagnosis Date Noted  ? Hypertension 08/16/2021  ? 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 Residual cognitive deficit as late effect of stroke 04/19/2021  ? Hemiparesis affecting right side as late effect of stroke ([place]) 04/19/2021  ? 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 ([place]) 04/19/2021  ? Thrombosis   ? Sleep disturbance   ? Dysphagia, post-stroke   ? PAF (paroxysmal atrial fibrillation) ([place])   ? Acute pulmonary embolism without acute cor pulmonale (HCC)   ? Malnutrition of moderate degree 11/04/2020  ? Tracheostomy care [facility])   ? Pressure injury of skin 10/23/2020  ? Intracranial hemorrhage ([place])   ? Atrial  fibrillation ([place])   ? Prediabetes   ? Leukocytosis   ? Acute blood loss anemia   ? Hypernatremia   ? ICH (intracerebral hemorrhage) ([place]) 09/25/2020  ? Unilateral primary osteoarthritis, left knee 09/26/2016  ? Atrial flutter ([place]) 09/25/2013  ? Near syncope 09/25/2013  ? ? ?ONSET DATE: 09/17/20 ? ?REFERRING DIAG: I62.9 (ICD-10-CM) - Intracranial hemorrhage ([place]) ? ?THERAPY DIAG:  ?Ataxia ? ?Unsteadiness on feet ? ?Other lack of coordination ? ?Muscle weakness (generalized) ? ?Visuospatial deficit ? ? ?PERTINENT HISTORY: ICH of brainstem (complicated by hydrocephalus, DVTs, and tracheostomy).  PMH:  a-fib  ? ?PRECAUTIONS: fall risk, ataxia, impulsivity, diplopia  ? ? ?SUBJECTIVE: Pt arrived with USTEP walker today. ? ?PAIN:  ?Are you having pain?  No ? ? ? ? ?OBJECTIVE:  ? ?TODAY'S TREATMENT:  ?11/12/21: ?Patient seen for aquatic therapy visit today.  Patient entered and exited the pool via stairs and two railings, with cueing and intermittent assistance to progress eft hand on railing.  Patient using step to pattern.  Worked in chest deep water most of session to address functioanl mobility - walking safely - forward, addressing directional changes, and balance - one legged stance as needed for ADL.  Worked to improve balance with stairs, by stepping up onto underwater step, and also safety with transitional movements in sit to stand, regaining balance, then walking.  Patient with improved ability to maintain steady movement throughout this session with minimal physical intervention. Needs further practice with lateral weight shifts toward right.   ? ? ?10/29/21: ?Patient seen for aquatic therapy visit today.  Patient used both rails to enter pool  with intermittent min assist and cueing for postural control.  Worked on walking in waist and chest deep water.  Patient with decreased control and balance with attempts to slow walking.  Patient with optimal walking with increased demand - increased speed, and increased effort - pushing against resistance.  Patient enjoys moving faster and feels he is able to get heart rate up slightly - have to balance this with safety and coordination/balance.  Worked on preliminary Ai Chi exercises to work on static to [employer] - with less turbulence and then slowly increasing turbulence.  Worked on actual swimming - freestyle and breast stroke.  Patient with decreased postural control, and noted ataxia especially in LUE.  Patient able to safely swim across length of pool   ?Patient tolerated longer session, and was able to carryover concepts of alignment - chest up, hips forward and narrower base of support.   ? ?10/23/21 ?Patient stating that he would like this to be his last OT clinic appointment, but wants to  continue in the pool.  He reports he is being discharged from PT/SLP and does not want to come for 45 min 1x/week for OT.   ?Reviewed pool schedule.   ?Worked today on whole body gross motor coordiantion.  Slowing movement, weight shifting onto left side - reaching up.  Working to encourage increased elongation of trunk on either side, maintain upright postureeven when arms active.  Alignment - hips forward, shoulders back - feet shoulder width apart - weight even between feet.   ?Patient with improved coordination with slower movements initially.   ? ? ? ? ? ? ? ? ? OT Short Term Goals - 09/19/21 1424   ? ?  ? OT SHORT TERM GOAL #1  ? Title Pt will be independent with HEP for LUE coordination   ? Time 4   ? Period Weeks   ?  Status Achieved   reviewed coordination HEP 09/19/21  ? Target Date 10/03/21   ?  ? OT SHORT TERM GOAL #2  ? Title Pt will verbalize understanding of sensory strategies for RUE sensory deficits.   ? Time 4   ? Period Weeks   ? Status Achieved pt reports testing with LUE first for temperature. 09/24/21  ?  ? OT SHORT TERM GOAL #3  ? Title Pt will improve LUE coordination for ADLs as shown by improving score on box and blocks test by at least 6.   ? Baseline L 18 R 39   ? Time 4   ? Period Weeks   ? Status Achieved  LUE 22 blocks   ? ?  ?  ? ?  ? ? ? OT Long Term Goals - 09/05/21 1459   ? ?  ? OT LONG TERM GOAL #1  ? Title Pt will be independent with Aquatics HEP PRN   ? Time 12   ? Period Weeks   ? Status New   ? Target Date 11/28/21   ?  ? OT LONG TERM GOAL #2  ? Title Pt will improve LUE hand coordination for ADLs as shown by completing 9-hole peg test in less than 90 seconds   ? Baseline 5 pegs in 2 minutes with several drops and knocking pegs out   ? Time 12   ? Period Weeks   ? Status Ongoing  Working on coordination  ?  ? OT LONG TERM GOAL #3  ? Title Pt will verbalize understanding of visual compensation strategies and functional scanning strategies for increasing safety with ADLs and IADLs.    ? Time 12   ? Period Weeks   ? Status Ongoing  Continue progress  ?  ? OT LONG TERM GOAL #4  ? Title Pt will perform environmental scanning with 90% accuracy for increased safety.  ?Time 12   ?Period We

## 2021-11-19 ENCOUNTER — Encounter: Payer: Self-pay | Admitting: Occupational Therapy

## 2021-11-19 ENCOUNTER — Ambulatory Visit: Payer: Medicare Other | Admitting: Occupational Therapy

## 2021-11-19 DIAGNOSIS — R278 Other lack of coordination: Secondary | ICD-10-CM

## 2021-11-19 DIAGNOSIS — R270 Ataxia, unspecified: Secondary | ICD-10-CM

## 2021-11-19 DIAGNOSIS — R41842 Visuospatial deficit: Secondary | ICD-10-CM

## 2021-11-19 DIAGNOSIS — R2681 Unsteadiness on feet: Secondary | ICD-10-CM

## 2021-11-19 DIAGNOSIS — M6281 Muscle weakness (generalized): Secondary | ICD-10-CM

## 2021-11-19 NOTE — Therapy (Signed)
OUTPATIENT OCCUPATIONAL THERAPY TREATMENT AND PROGRESS NOTE   Patient Name: Walter [name] MRN: [id number] DOB:March 03, 1956, 66 y.o., male Today's Date: 11/19/2021  PCP: Clovis Riley, L.August Saucer, MD REFERRING PROVIDER: Clovis Riley, Elbert Ewings.August Saucer, MD   OT End of Session - 11/19/21 1533     Visit Number 11    Number of Visits 25    Date for OT Re-Evaluation 11/28/21    Authorization Type Medicare Part A & B    Progress Note Due on Visit 20    OT Start Time 1415    OT Stop Time 1515    OT Time Calculation (min) 60 min    Behavior During Therapy WFL for tasks assessed/performed             Past Medical History:  Diagnosis Date   Atrial flutter (HCC)    s/p ablation   DVT (deep venous thrombosis) (HCC)    Near syncope 09/25/2013   Paroxysmal atrial fibrillation (HCC)    Stroke [facility])    initially hemorragic (not on OAC) followed by subsequent embolic stroke   Past Surgical History:  Procedure Laterality Date   A-FLUTTER ABLATION N/A 03/09/2020   Procedure: A-FLUTTER ABLATION;  Surgeon: Hillis Range, MD;  Location: [facility] INVASIVE [facility] LAB;  Service: Cardiovascular;  Laterality: N/A;   CARDIOVERSION N/A 09/27/2013   Procedure: CARDIOVERSION;  Surgeon: Lewayne Bunting, MD;  Location: [facility] ENDOSCOPY;  Service: Cardiovascular;  Laterality: N/A;   IR ANGIO EXTERNAL CAROTID SEL EXT CAROTID UNI R MOD SED  09/26/2020   IR ANGIO INTRA EXTRACRAN SEL COM CAROTID INNOMINATE BILAT MOD SED  09/26/2020   IR ANGIO VERTEBRAL SEL VERTEBRAL UNI R MOD SED  09/26/2020   IR IVC FILTER PLMT / S&I /IMG GUID/MOD SED  10/08/2020   IR IVC FILTER RETRIEVAL / S&I /IMG GUID/MOD SED  09/28/2021   IR IVUS EACH ADDITIONAL NON CORONARY VESSEL  11/02/2020   IR PTA VENOUS EXCEPT DIALYSIS CIRCUIT  11/02/2020   IR RADIOLOGIST EVAL & MGMT  01/17/2021   IR RADIOLOGIST EVAL & MGMT  09/12/2021   IR THROMBECT VENO MECH MOD SED  10/27/2020   IR THROMBECT VENO MECH MOD SED  11/02/2020   IR US GUIDE VASC ACCESS LEFT  10/27/2020   IR US GUIDE VASC ACCESS  RIGHT  09/26/2020   IR US GUIDE VASC ACCESS RIGHT  11/02/2020   IR VENO/EXT/UNI RIGHT  11/02/2020   IR VENOCAVAGRAM IVC  10/27/2020   RETINAL DETACHMENT SURGERY     right knee arthroscopy     TEE WITHOUT CARDIOVERSION N/A 09/27/2013   Procedure: TRANSESOPHAGEAL ECHOCARDIOGRAM (TEE);  Surgeon: Lewayne Bunting, MD;  Location: [facility] ENDOSCOPY;  Service: Cardiovascular;  Laterality: N/A;   WRIST SURGERY     Patient Active Problem List   Diagnosis Date Noted   Hypertension 08/16/2021   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   Residual cognitive deficit as late effect of stroke 04/19/2021   Hemiparesis affecting right side as late effect of stroke (HCC) 04/19/2021   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 04/19/2021   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 (HCC) 04/19/2021   Thrombosis    Sleep disturbance    Dysphagia, post-stroke    PAF (paroxysmal atrial fibrillation) (HCC)    Acute pulmonary embolism without acute cor pulmonale (HCC)    Malnutrition of moderate degree 11/04/2020   Tracheostomy care (HCC)    Pressure injury of skin 10/23/2020   Intracranial hemorrhage (HCC)    Atrial fibrillation (HCC)    Prediabetes  Leukocytosis    Acute blood loss anemia    Hypernatremia    ICH (intracerebral hemorrhage) (HCC) 09/25/2020   Unilateral primary osteoarthritis, left knee 09/26/2016   Atrial flutter (HCC) 09/25/2013   Near syncope 09/25/2013    ONSET DATE: 09/17/20  REFERRING DIAG: I62.9 (ICD-10-CM) - Intracranial hemorrhage (HCC)  THERAPY DIAG:  Ataxia  Unsteadiness on feet  Other lack of coordination  Muscle weakness (generalized)  Visuospatial deficit   PERTINENT HISTORY: ICH of brainstem (complicated by hydrocephalus, DVTs, and tracheostomy).  PMH:  a-fib   PRECAUTIONS: fall risk, ataxia, impulsivity, diplopia    SUBJECTIVE: Pt arrived with USTEP walker today.  PAIN:  Are you having pain? No     OBJECTIVE:   TODAY'S TREATMENT:   11/20/21 Patient seen for aquatic therapy visit today.  Patient entered and exited the pool via stairs and two railings, with cueing and intermittent assistance to progress left hand on railing.  Patient using step to pattern initially, but step through pattern on exit with min assist.   Patient is building a new home and is including a therapy pool.  Discussed rail situation in new pool, patient will only have one railing - easier for automatic cover than 2 rails.  Practiced entering and exiting the pool with use of left rail for entry and right rail for exit - two hands on railing and side stepping in both directions.  Discussed benefit of making visual border of stairs to allow him to better align feet for entry.   Worked on safety with functional mobility - walking forward, backward, loss of balance and recovery, directional changes.  Worked on static to slightly dynamic standing activities to help with balance.   Discussed importance of aquatic therapy translating to function on land.    11/12/21: Patient seen for aquatic therapy visit today.  Patient entered and exited the pool via stairs and two railings, with cueing and intermittent assistance to progress eft hand on railing.  Patient using step to pattern.  Worked in chest deep water most of session to address functioanl mobility - walking safely - forward, addressing directional changes, and balance - one legged stance as needed for ADL.  Worked to improve balance with stairs, by stepping up onto underwater step, and also safety with transitional movements in sit to stand, regaining balance, then walking.  Patient with improved ability to maintain steady movement throughout this session with minimal physical intervention. Needs further practice with lateral weight shifts toward right.                OT Short Term Goals - 09/19/21 1424       OT SHORT TERM GOAL #1   Title Pt will be independent with HEP for LUE coordination    Time  4    Period Weeks    Status Achieved   reviewed coordination HEP 09/19/21   Target Date 10/03/21      OT SHORT TERM GOAL #2   Title Pt will verbalize understanding of sensory strategies for RUE sensory deficits.    Time 4    Period Weeks    Status Achieved pt reports testing with LUE first for temperature. 09/24/21     OT SHORT TERM GOAL #3   Title Pt will improve LUE coordination for ADLs as shown by improving score on box and blocks test by at least 6.    Baseline L 18 R 39    Time 4    Period Weeks  Status Achieved  LUE 22 blocks              OT Long Term Goals - 09/05/21 1459       OT LONG TERM GOAL #1   Title Pt will be independent with Aquatics HEP PRN    Time 12    Period Weeks    Status Ongoing   Target Date 11/28/21      OT LONG TERM GOAL #2   Title Pt will improve LUE hand coordination for ADLs as shown by completing 9-hole peg test in less than 90 seconds    Baseline 5 pegs in 2 minutes with several drops and knocking pegs out    Time 12    Period Weeks    Status Deferred  Working on coordination     OT LONG TERM GOAL #3   Title Pt will verbalize understanding of visual compensation strategies and functional scanning strategies for increasing safety with ADLs and IADLs.    Time 12    Period Weeks    Status Ongoing  Continue progress     OT LONG TERM GOAL #4   Title Pt will perform environmental scanning with 90% accuracy for increased safety.  Time 12   Period Weeks   Status Ongoing         OT LONG TERM GOAL #5   Title Pt will demo good safety awareness/compensation for sensory deficits and attention to LUE during functional tasks and transfers without cueing.    Time 12    Period Weeks    Status Ongoing     OT LONG TERM GOAL #6   Title Pt will report increased ease and accuracy with handwriting and texting with visual strategies and compensations as needed.    Time 12    Period Weeks    Status Ongoing              Plan - 09/24/21  1354     Clinical Impression Statement This progress report covers dates of service from 09/05/21-11/12/21.  Patient opts to only move forward with aquatic therapy at this time.  Will continue to address postural control as related to ADL/IADL.      OT Occupational Profile and History Detailed Assessment- Review of Records and additional review of physical, cognitive, psychosocial history related to current functional performance    Occupational performance deficits (Please refer to evaluation for details): ADL's;IADL's;Work;Leisure;Social Participation    Body Structure / Function / Physical Skills ADL;Strength;Balance;Proprioception;UE functional use;IADL;Endurance;Vision;Mobility;Coordination;Decreased knowledge of precautions;FMC;GMC;Sensation    Cognitive Skills Attention;Safety Awareness;Memory;Perception;Problem Solve    Rehab Potential Fair   d/t duration from initial event   Clinical Decision Making Several treatment options, min-mod task modification necessary    Comorbidities Affecting Occupational Performance: May have comorbidities impacting occupational performance    Modification or Assistance to Complete Evaluation  Min-Moderate modification of tasks or assist with assess necessary to complete eval    OT Frequency Other (comment)   1-2 visits based on need for aquatics   OT Duration 12 weeks   may d/c early based on progress or change frequency based on aquatics and need for more visits 1-2 visits over 12 weeks.   OT Treatment/Interventions Self-care/ADL training;Moist Heat;Fluidtherapy;DME and/or AE instruction;Balance training;Therapeutic activities;Aquatic Therapy;Ultrasound;Therapeutic exercise;Cognitive remediation/compensation;Visual/perceptual remediation/compensation;Functional Mobility Training;Neuromuscular education;Cryotherapy;Energy conservation;Manual Therapy;Patient/family education    Plan Aquatics to address safety, balance, visual compensation, and gross motor  coordination   Consulted and Agree with Plan of Care Patient  Collier Salina, OT 11/19/2021, 3:34 PM

## 2021-12-06 ENCOUNTER — Other Ambulatory Visit: Payer: Self-pay | Admitting: Neurology

## 2021-12-10 ENCOUNTER — Encounter: Payer: Self-pay | Admitting: Occupational Therapy

## 2021-12-10 ENCOUNTER — Ambulatory Visit: Payer: Medicare Other | Attending: Family Medicine | Admitting: Occupational Therapy

## 2021-12-10 DIAGNOSIS — R41842 Visuospatial deficit: Secondary | ICD-10-CM

## 2021-12-10 DIAGNOSIS — M6281 Muscle weakness (generalized): Secondary | ICD-10-CM

## 2021-12-10 DIAGNOSIS — R278 Other lack of coordination: Secondary | ICD-10-CM

## 2021-12-10 DIAGNOSIS — R2681 Unsteadiness on feet: Secondary | ICD-10-CM | POA: Diagnosis present

## 2021-12-10 DIAGNOSIS — R270 Ataxia, unspecified: Secondary | ICD-10-CM

## 2021-12-10 NOTE — Therapy (Signed)
OUTPATIENT OCCUPATIONAL THERAPY TREATMENT    Patient Name: Walter [name] MRN: [id number] DOB:1955-07-04, 66 y.o., male Today's Date: 12/10/2021  PCP: Clovis Riley, L.August Saucer, MD REFERRING PROVIDER: Clovis Riley, Elbert Ewings.August Saucer, MD   OT End of Session - 12/10/21 1716     Visit Number 12    Number of Visits 25    Date for OT Re-Evaluation 01/28/22    Authorization Type Medicare Part A & B    Progress Note Due on Visit 20    OT Start Time 1415    OT Stop Time 1500    OT Time Calculation (min) 45 min    Equipment Utilized During Treatment floatation equipment    Activity Tolerance Patient tolerated treatment well    Behavior During Therapy WFL for tasks assessed/performed             Past Medical History:  Diagnosis Date   Atrial flutter (HCC)    s/p ablation   DVT (deep venous thrombosis) (HCC)    Near syncope 09/25/2013   Paroxysmal atrial fibrillation (HCC)    Stroke [facility])    initially hemorragic (not on OAC) followed by subsequent embolic stroke   Past Surgical History:  Procedure Laterality Date   A-FLUTTER ABLATION N/A 03/09/2020   Procedure: A-FLUTTER ABLATION;  Surgeon: Hillis Range, MD;  Location: [facility] INVASIVE CV LAB;  Service: Cardiovascular;  Laterality: N/A;   CARDIOVERSION N/A 09/27/2013   Procedure: CARDIOVERSION;  Surgeon: Lewayne Bunting, MD;  Location: [facility] ENDOSCOPY;  Service: Cardiovascular;  Laterality: N/A;   IR ANGIO EXTERNAL CAROTID SEL EXT CAROTID UNI R MOD SED  09/26/2020   IR ANGIO INTRA EXTRACRAN SEL COM CAROTID INNOMINATE BILAT MOD SED  09/26/2020   IR ANGIO VERTEBRAL SEL VERTEBRAL UNI R MOD SED  09/26/2020   IR IVC FILTER PLMT / S&I /IMG GUID/MOD SED  10/08/2020   IR IVC FILTER RETRIEVAL / S&I /IMG GUID/MOD SED  09/28/2021   IR IVUS EACH ADDITIONAL NON CORONARY VESSEL  11/02/2020   IR PTA VENOUS EXCEPT DIALYSIS CIRCUIT  11/02/2020   IR RADIOLOGIST EVAL & MGMT  01/17/2021   IR RADIOLOGIST EVAL & MGMT  09/12/2021   IR THROMBECT VENO MECH MOD SED  10/27/2020   IR  THROMBECT VENO MECH MOD SED  11/02/2020   IR US GUIDE VASC ACCESS LEFT  10/27/2020   IR US GUIDE VASC ACCESS RIGHT  09/26/2020   IR US GUIDE VASC ACCESS RIGHT  11/02/2020   IR VENO/EXT/UNI RIGHT  11/02/2020   IR VENOCAVAGRAM IVC  10/27/2020   RETINAL DETACHMENT SURGERY     right knee arthroscopy     TEE WITHOUT CARDIOVERSION N/A 09/27/2013   Procedure: TRANSESOPHAGEAL ECHOCARDIOGRAM (TEE);  Surgeon: Lewayne Bunting, MD;  Location: [facility] ENDOSCOPY;  Service: Cardiovascular;  Laterality: N/A;   WRIST SURGERY     Patient Active Problem List   Diagnosis Date Noted   Hypertension 08/16/2021   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   Residual cognitive deficit as late effect of stroke 04/19/2021   Hemiparesis affecting right side as late effect of stroke (HCC) 04/19/2021   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 04/19/2021   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 (HCC) 04/19/2021   Thrombosis    Sleep disturbance    Dysphagia, post-stroke    PAF (paroxysmal atrial fibrillation) (HCC)    Acute pulmonary embolism without acute cor pulmonale (HCC)    Malnutrition of moderate degree 11/04/2020   Tracheostomy care (HCC)    Pressure injury of skin 10/23/2020  Intracranial hemorrhage (HCC)    Atrial fibrillation (HCC)    Prediabetes    Leukocytosis    Acute blood loss anemia    Hypernatremia    ICH (intracerebral hemorrhage) (HCC) 09/25/2020   Unilateral primary osteoarthritis, left knee 09/26/2016   Atrial flutter (HCC) 09/25/2013   Near syncope 09/25/2013    ONSET DATE: 09/17/20  REFERRING DIAG: I62.9 (ICD-10-CM) - Intracranial hemorrhage (HCC)  THERAPY DIAG:  Other lack of coordination - Plan: Ot plan of care cert/re-cert  Ataxia - Plan: Ot plan of care cert/re-cert  Unsteadiness on feet  Muscle weakness (generalized)  Visuospatial deficit   PERTINENT HISTORY: ICH of brainstem (complicated by hydrocephalus, DVTs, and tracheostomy).  PMH:  a-fib   PRECAUTIONS:  fall risk, ataxia, impulsivity, diplopia    SUBJECTIVE: Pt arrived with USTEP walker today.  PAIN:  Are you having pain? No     OBJECTIVE:   TODAY'S TREATMENT:  12/10/21 Patient seen for aquatic therapy visit today.  Patient entered and exited the pool via stairs and two railings, with cueing and intermittent assistance .  Patient using step to pattern initially, but step through pattern on exit with min assist.  Skilled OT intervention with emphasis on maintaining balance initially in deep water 4 ft 8 inches and 4 ft 5 inches - for single leg stance, static to dynamic stand balance and walking forward, backward, sideways.  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  Patient with balance improvement evident in deeper (4 ft and above) water.  Walking without assistance.  Patient continues to have difficulty regaining footing with loss of balance.  Will continue to work toward improved safety in water.   Worked with underwater bench, and transition from sit to stand and stand to sit narrowing his very large base of support with subsequent repetitions.  Patient with significant improvement in safety of this simple transitional movement.  Patient feels his balance on land is benefiting from work in water although he still reports falls at home.                  OT Short Term Goals - 09/19/21 1424       OT SHORT TERM GOAL #1   Title Pt will be independent with HEP for LUE coordination    Time 4    Period Weeks    Status Achieved   reviewed coordination HEP 09/19/21   Target Date 10/03/21      OT SHORT TERM GOAL #2   Title Pt will verbalize understanding of sensory strategies for RUE sensory deficits.    Time 4    Period Weeks    Status Achieved pt reports testing with LUE first for temperature. 09/24/21     OT SHORT TERM GOAL #3   Title Pt will improve LUE coordination for ADLs as shown by improving score on box and blocks test by at least 6.    Baseline L 18 R 39     Time 4    Period Weeks    Status Achieved  LUE 22 blocks              OT Long Term Goals - 09/05/21 1459       OT LONG TERM GOAL #1   Title Pt will be independent with Aquatics HEP PRN    Time 12    Period Weeks    Status Ongoing   Target Date 01/28/22      OT LONG TERM GOAL #2  Title Pt will improve LUE hand coordination for ADLs as shown by completing 9-hole peg test in less than 90 seconds    Baseline 5 pegs in 2 minutes with several drops and knocking pegs out    Time 12    Period Weeks    Status Deferred  Working on coordination     OT LONG TERM GOAL #3   Title Pt will verbalize understanding of visual compensation strategies and functional scanning strategies for increasing safety with ADLs and IADLs.    Time 12    Period Weeks    Status Ongoing  Continue progress     OT LONG TERM GOAL #4   Title Pt will perform environmental scanning with 90% accuracy for increased safety.  Time 12   Period Weeks   Status Ongoing         OT LONG TERM GOAL #5   Title Pt will demo good safety awareness/compensation for sensory deficits and attention to LUE during functional tasks and transfers without cueing.    Time 12    Period Weeks    Status Ongoing     OT LONG TERM GOAL #6   Title Pt will report increased ease and accuracy with handwriting and texting with visual strategies and compensations as needed.    Time 12    Period Weeks    Status Ongoing              Plan - 09/24/21 1354     Clinical Impression Statement Patient is showing improved balance and overall endurance in aquatic environment.  Continue to work toward more autonomy and safety for home program.     OT Occupational Profile and History Detailed Assessment- Review of Records and additional review of physical, cognitive, psychosocial history related to current functional performance    Occupational performance deficits (Please refer to evaluation for details): ADL's;IADL's;Work;Leisure;Social  Participation    Body Structure / Function / Physical Skills ADL;Strength;Balance;Proprioception;UE functional use;IADL;Endurance;Vision;Mobility;Coordination;Decreased knowledge of precautions;FMC;GMC;Sensation    Cognitive Skills Attention;Safety Awareness;Memory;Perception;Problem Solve    Rehab Potential Fair   d/t duration from initial event   Clinical Decision Making Several treatment options, min-mod task modification necessary    Comorbidities Affecting Occupational Performance: May have comorbidities impacting occupational performance    Modification or Assistance to Complete Evaluation  Min-Moderate modification of tasks or assist with assess necessary to complete eval    OT Frequency Other (comment)   1x/wk   OT Duration 8 weeks      OT Treatment/Interventions Self-care/ADL training;Moist Heat;Fluidtherapy;DME and/or AE instruction;Balance training;Therapeutic activities;Aquatic Therapy;Ultrasound;Therapeutic exercise;Cognitive remediation/compensation;Visual/perceptual remediation/compensation;Functional Mobility Training;Neuromuscular education;Cryotherapy;Energy conservation;Manual Therapy;Patient/family education    Plan Aquatics to address safety, balance, visual compensation, and gross motor coordination   Consulted and Agree with Plan of Care Patient              Collier Salina, OT 12/10/2021, 5:31 PM

## 2021-12-17 ENCOUNTER — Ambulatory Visit (INDEPENDENT_AMBULATORY_CARE_PROVIDER_SITE_OTHER): Payer: Medicare Other | Admitting: Internal Medicine

## 2021-12-17 ENCOUNTER — Encounter: Payer: Self-pay | Admitting: Internal Medicine

## 2021-12-17 ENCOUNTER — Encounter: Payer: Self-pay | Admitting: Occupational Therapy

## 2021-12-17 ENCOUNTER — Ambulatory Visit: Payer: Medicare Other | Admitting: Occupational Therapy

## 2021-12-17 VITALS — BP 108/60 | HR 79 | Ht 72.0 in | Wt 231.0 lb

## 2021-12-17 DIAGNOSIS — R278 Other lack of coordination: Secondary | ICD-10-CM | POA: Diagnosis not present

## 2021-12-17 DIAGNOSIS — I480 Paroxysmal atrial fibrillation: Secondary | ICD-10-CM | POA: Diagnosis not present

## 2021-12-17 DIAGNOSIS — I613 Nontraumatic intracerebral hemorrhage in brain stem: Secondary | ICD-10-CM

## 2021-12-17 DIAGNOSIS — M6281 Muscle weakness (generalized): Secondary | ICD-10-CM

## 2021-12-17 DIAGNOSIS — R41842 Visuospatial deficit: Secondary | ICD-10-CM

## 2021-12-17 DIAGNOSIS — R2681 Unsteadiness on feet: Secondary | ICD-10-CM

## 2021-12-17 DIAGNOSIS — R270 Ataxia, unspecified: Secondary | ICD-10-CM

## 2021-12-17 DIAGNOSIS — I639 Cerebral infarction, unspecified: Secondary | ICD-10-CM

## 2021-12-17 NOTE — Progress Notes (Signed)
HPI Walter [name] returns for ongoing evaluation and management of atrial fib. He is a pleasant 66 yo man with a h/o atrial flutter s/p ablation who developed very rare episodes of atrial fib. His CHADSVASC score was 1-2 based on age and mild calcifications in the abdominal aorta. He also has a h/o DVT and is s/p IVC filter, requring extensive thrombectomy. The patient was restarted on Eliquis after the acute DVT. He has remained on this medication. He has undergone extensive rehab at the [facility] in [place], [place]. He feels well but is still limited. His ICH was not thought to be due to an embolic event with tranformation but a primary bleed. He has been on eliquis for thromboembolic control in the setting of CHADSVASC score of 2-3.   He notes that he has had some leg weakness, maybe a little worse as he has not been able to do much additional rehab.  Allergies  Allergen Reactions   Keppra [Levetiracetam] Swelling    Patient experienced angioedema post inpatient keppra dose   Latex Itching    Skin turns real red     Current Outpatient Medications  Medication Sig Dispense Refill   apixaban (ELIQUIS) 5 MG TABS tablet Take 1 tablet (5 mg total) by mouth 2 (two) times daily. 60 tablet 1   atorvastatin (LIPITOR) 20 MG tablet Take 1 tablet (20 mg total) by mouth at bedtime. 90 tablet 3   EPINEPHrine 0.3 mg/0.3 mL IJ SOAJ injection Inject 0.3 mg into the muscle as needed for anaphylaxis.     mirtazapine (REMERON) 7.5 MG tablet TAKE 2 TABLETS BY MOUTH AT BEDTIME 180 tablet 1   pregabalin (LYRICA) 50 MG capsule Take 1 capsule (50 mg total) by mouth 2 (two) times daily. 60 capsule 5   sertraline (ZOLOFT) 100 MG tablet Take 100 mg by mouth daily.     diltiazem (CARDIZEM CD) 120 MG 24 hr capsule Take 1 capsule (120 mg total) by mouth daily. 90 capsule 3   No current facility-administered medications for this visit.     Past Medical History:  Diagnosis Date   Atrial flutter [facility])     s/p ablation   DVT (deep venous thrombosis) (HCC)    Near syncope 09/25/2013   Paroxysmal atrial fibrillation (HCC)    Stroke [facility])    initially hemorragic (not on OAC) followed by subsequent embolic stroke    ROS:   All systems reviewed and negative except as noted in the HPI.   Past Surgical History:  Procedure Laterality Date   A-FLUTTER ABLATION N/A 03/09/2020   Procedure: A-FLUTTER ABLATION;  Surgeon: Hillis Range, MD;  Location: [facility] INVASIVE CV LAB;  Service: Cardiovascular;  Laterality: N/A;   CARDIOVERSION N/A 09/27/2013   Procedure: CARDIOVERSION;  Surgeon: Lewayne Bunting, MD;  Location: [facility] ENDOSCOPY;  Service: Cardiovascular;  Laterality: N/A;   IR ANGIO EXTERNAL CAROTID SEL EXT CAROTID UNI R MOD SED  09/26/2020   IR ANGIO INTRA EXTRACRAN SEL COM CAROTID INNOMINATE BILAT MOD SED  09/26/2020   IR ANGIO VERTEBRAL SEL VERTEBRAL UNI R MOD SED  09/26/2020   IR IVC FILTER PLMT / S&I /IMG GUID/MOD SED  10/08/2020   IR IVC FILTER RETRIEVAL / S&I /IMG GUID/MOD SED  09/28/2021   IR IVUS EACH ADDITIONAL NON CORONARY VESSEL  11/02/2020   IR PTA VENOUS EXCEPT DIALYSIS CIRCUIT  11/02/2020   IR RADIOLOGIST EVAL & MGMT  01/17/2021   IR RADIOLOGIST EVAL & MGMT  09/12/2021  IR THROMBECT VENO MECH MOD SED  10/27/2020   IR THROMBECT VENO MECH MOD SED  11/02/2020   IR US GUIDE VASC ACCESS LEFT  10/27/2020   IR US GUIDE VASC ACCESS RIGHT  09/26/2020   IR US GUIDE VASC ACCESS RIGHT  11/02/2020   IR VENO/EXT/UNI RIGHT  11/02/2020   IR VENOCAVAGRAM IVC  10/27/2020   RETINAL DETACHMENT SURGERY     right knee arthroscopy     TEE WITHOUT CARDIOVERSION N/A 09/27/2013   Procedure: TRANSESOPHAGEAL ECHOCARDIOGRAM (TEE);  Surgeon: Lewayne Bunting, MD;  Location: [facility] ENDOSCOPY;  Service: Cardiovascular;  Laterality: N/A;   WRIST SURGERY       Family History  Problem Relation Age of Onset   Heart disease Mother    Heart disease Father    Healthy Sister    Healthy Brother      Social History   Socioeconomic  History   Marital status: Married    Spouse name: cathy   Number of children: Not on file   Years of education: Not on file   Highest education level: Not on file  Occupational History   Not on file  Tobacco Use   Smoking status: Never   Smokeless tobacco: Never  Vaping Use   Vaping Use: Never used  Substance and Sexual Activity   Alcohol use: Yes    Comment: occasionally   Drug use: No   Sexual activity: Not on file  Other Topics Concern   Not on file  Social History Narrative   Lives with wife at home   Right Handed   Drinks decaf   Social Determinants of Health   Financial Resource Strain: Not on file  Food Insecurity: Not on file  Transportation Needs: Not on file  Physical Activity: Not on file  Stress: Not on file  Social Connections: Not on file  Intimate Partner Violence: Not on file     BP 108/60   Pulse 79   Ht 6' (1.829 m)   Wt 231 lb (104.8 kg)   SpO2 91%   BMI 31.33 kg/m   Physical Exam:  Well appearing NAD HEENT: Unremarkable Neck:  No JVD, no thyromegally Lymphatics:  No adenopathy Back:  No CVA tenderness Lungs:  Clear with no wheezes HEART:  Regular rate rhythm, no murmurs, no rubs, no clicks Abd:  soft, positive bowel sounds, no organomegally, no rebound, no guarding Ext:  2 plus pulses, no edema, no cyanosis, no clubbing Skin:  No rashes no nodules Neuro:  CN II through XII intact, left hp.  EKG - nsr    Assess/Plan:   PAF - he appears to be maintaining NSR. We discussed Watchman procedure. Based on his bleeding risk, especially recurrent intracranial bleeding while on Eliquis, and risk of thromboembolism ( stroke risk is age, prior thromboembolism, and vascular calcification), I would suggest proceeding with Watchman so that he might be able to get off of long term OAC.  ICH - he has had gradual neuro improvement, particularly since his rehab. I encouraged him to remain active by hiring an atheletic trainer or physical  therapist. DVT - see above. He will continue eliquis for now. I would envision stopping this once his Watchman has been placed. Atrial flutter - he is s/p ablation with no recurrent episodes of flutter.    Sharlot Gowda Walter Buzby,MD

## 2021-12-17 NOTE — Therapy (Unsigned)
OUTPATIENT OCCUPATIONAL THERAPY TREATMENT    Patient Name: Walter [name] MRN: [id number] DOB:Feb 25, 1956, 66 y.o., male Today's Date: 12/17/2021  PCP: Clovis Riley, L.August Saucer, MD REFERRING PROVIDER: Clovis Riley, Elbert Ewings.August Saucer, MD   OT End of Session - 12/17/21 1518     Visit Number 13    Number of Visits 25    Date for OT Re-Evaluation 01/28/22    Authorization Type Medicare Part A & B    Progress Note Due on Visit 20    OT Start Time 1430    OT Stop Time 1515    OT Time Calculation (min) 45 min             Past Medical History:  Diagnosis Date   Atrial flutter (HCC)    s/p ablation   DVT (deep venous thrombosis) (HCC)    Near syncope 09/25/2013   Paroxysmal atrial fibrillation (HCC)    Stroke [facility])    initially hemorragic (not on OAC) followed by subsequent embolic stroke   Past Surgical History:  Procedure Laterality Date   A-FLUTTER ABLATION N/A 03/09/2020   Procedure: A-FLUTTER ABLATION;  Surgeon: Hillis Range, MD;  Location: [facility] INVASIVE CV LAB;  Service: Cardiovascular;  Laterality: N/A;   CARDIOVERSION N/A 09/27/2013   Procedure: CARDIOVERSION;  Surgeon: Lewayne Bunting, MD;  Location: [facility] ENDOSCOPY;  Service: Cardiovascular;  Laterality: N/A;   IR ANGIO EXTERNAL CAROTID SEL EXT CAROTID UNI R MOD SED  09/26/2020   IR ANGIO INTRA EXTRACRAN SEL COM CAROTID INNOMINATE BILAT MOD SED  09/26/2020   IR ANGIO VERTEBRAL SEL VERTEBRAL UNI R MOD SED  09/26/2020   IR IVC FILTER PLMT / S&I /IMG GUID/MOD SED  10/08/2020   IR IVC FILTER RETRIEVAL / S&I /IMG GUID/MOD SED  09/28/2021   IR IVUS EACH ADDITIONAL NON CORONARY VESSEL  11/02/2020   IR PTA VENOUS EXCEPT DIALYSIS CIRCUIT  11/02/2020   IR RADIOLOGIST EVAL & MGMT  01/17/2021   IR RADIOLOGIST EVAL & MGMT  09/12/2021   IR THROMBECT VENO MECH MOD SED  10/27/2020   IR THROMBECT VENO MECH MOD SED  11/02/2020   IR US GUIDE VASC ACCESS LEFT  10/27/2020   IR US GUIDE VASC ACCESS RIGHT  09/26/2020   IR US GUIDE VASC ACCESS RIGHT  11/02/2020   IR VENO/EXT/UNI  RIGHT  11/02/2020   IR VENOCAVAGRAM IVC  10/27/2020   RETINAL DETACHMENT SURGERY     right knee arthroscopy     TEE WITHOUT CARDIOVERSION N/A 09/27/2013   Procedure: TRANSESOPHAGEAL ECHOCARDIOGRAM (TEE);  Surgeon: Lewayne Bunting, MD;  Location: [facility] ENDOSCOPY;  Service: Cardiovascular;  Laterality: N/A;   WRIST SURGERY     Patient Active Problem List   Diagnosis Date Noted   Hypertension 08/16/2021   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   Residual cognitive deficit as late effect of stroke 04/19/2021   Hemiparesis affecting right side as late effect of stroke (HCC) 04/19/2021   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 04/19/2021   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 (HCC) 04/19/2021   Thrombosis    Sleep disturbance    Dysphagia, post-stroke    PAF (paroxysmal atrial fibrillation) (HCC)    Acute pulmonary embolism without acute cor pulmonale (HCC)    Malnutrition of moderate degree 11/04/2020   Tracheostomy care (HCC)    Pressure injury of skin 10/23/2020   Intracranial hemorrhage (HCC)    Atrial fibrillation (HCC)    Prediabetes    Leukocytosis    Acute blood loss anemia    Hypernatremia  ICH (intracerebral hemorrhage) ([place]) 09/25/2020   Unilateral primary osteoarthritis, left knee 09/26/2016   Atrial flutter ([place]) 09/25/2013   Near syncope 09/25/2013    ONSET DATE: 09/17/20  REFERRING DIAG: I62.9 (ICD-10-CM) - Intracranial hemorrhage (HCC)  THERAPY DIAG:  Other lack of coordination  Ataxia  Unsteadiness on feet  Muscle weakness (generalized)  Visuospatial deficit   PERTINENT HISTORY: ICH of brainstem (complicated by hydrocephalus, DVTs, and tracheostomy).  PMH:  a-fib   PRECAUTIONS: fall risk, ataxia, impulsivity, diplopia    SUBJECTIVE: Pt arrived with USTEP walker today.  PAIN:  Are you having pain? No     OBJECTIVE:   TODAY'S TREATMENT:  12/17/21 Patient reports being scheduled fr surgery - Watchman's procedure.    Patient seen for aquatic therapy visit today.  Patient entered and exited the pool via stairs and two railings, with cueing and intermittent assistance .  Patient using step to pattern initially, but step through pattern on exit with min assist.  Skilled OT intervention with emphasis on maintaining balance initially in deep water 4 ft 8 inches and 4 ft 5 inches - for single leg stance, static to dynamic stand balance and walking forward, backward, sideways.  Gradually worked on same skills in Theatre manager.  Patient with balance improvement evident in deeper (4 ft and above) water.  Walking without assistance.  Patient showing improvement with regaining footing with loss of balance.  Will continue to work toward improved safety in water.   Worked with underwater bench, and transition from sit to stand and stand to sit narrowing his very large base of support with subsequent repetitions.  Patient with significant improvement in safety of this simple transitional movement.  Patient feels his balance on land is benefiting from work in water although he still reports falls at home.                  OT Short Term Goals - 09/19/21 1424       OT SHORT TERM GOAL #1   Title Pt will be independent with HEP for LUE coordination    Time 4    Period Weeks    Status Achieved   reviewed coordination HEP 09/19/21   Target Date 10/03/21      OT SHORT TERM GOAL #2   Title Pt will verbalize understanding of sensory strategies for RUE sensory deficits.    Time 4    Period Weeks    Status Achieved pt reports testing with LUE first for temperature. 09/24/21     OT SHORT TERM GOAL #3   Title Pt will improve LUE coordination for ADLs as shown by improving score on box and blocks test by at least 6.    Baseline L 18 R 39    Time 4    Period Weeks    Status Achieved  LUE 22 blocks              OT Long Term Goals - 09/05/21 1459       OT LONG TERM GOAL #1   Title Pt will be independent with  Aquatics HEP PRN    Time 12    Period Weeks    Status Ongoing   Target Date 01/28/22      OT LONG TERM GOAL #2   Title Pt will improve LUE hand coordination for ADLs as shown by completing 9-hole peg test in less than 90 seconds    Baseline 5 pegs in 2 minutes with several drops and knocking  pegs out    Time 12    Period Weeks    Status Deferred  Working on coordination     OT LONG TERM GOAL #3   Title Pt will verbalize understanding of visual compensation strategies and functional scanning strategies for increasing safety with ADLs and IADLs.    Time 12    Period Weeks    Status Ongoing  Continue progress     OT LONG TERM GOAL #4   Title Pt will perform environmental scanning with 90% accuracy for increased safety.  Time 12   Period Weeks   Status Ongoing         OT LONG TERM GOAL #5   Title Pt will demo good safety awareness/compensation for sensory deficits and attention to LUE during functional tasks and transfers without cueing.    Time 12    Period Weeks    Status Ongoing     OT LONG TERM GOAL #6   Title Pt will report increased ease and accuracy with handwriting and texting with visual strategies and compensations as needed.    Time 12    Period Weeks    Status Ongoing              Plan - 09/24/21 1354     Clinical Impression Statement Patient is showing improved balance and overall endurance in aquatic environment.  Continue to work toward more autonomy and safety for home program.     OT Occupational Profile and History Detailed Assessment- Review of Records and additional review of physical, cognitive, psychosocial history related to current functional performance    Occupational performance deficits (Please refer to evaluation for details): ADL's;IADL's;Work;Leisure;Social Participation    Body Structure / Function / Physical Skills ADL;Strength;Balance;Proprioception;UE functional use;IADL;Endurance;Vision;Mobility;Coordination;Decreased knowledge of  precautions;FMC;GMC;Sensation    Cognitive Skills Attention;Safety Awareness;Memory;Perception;Problem Solve    Rehab Potential Fair   d/t duration from initial event   Clinical Decision Making Several treatment options, min-mod task modification necessary    Comorbidities Affecting Occupational Performance: May have comorbidities impacting occupational performance    Modification or Assistance to Complete Evaluation  Min-Moderate modification of tasks or assist with assess necessary to complete eval    OT Frequency Other (comment)   1x/wk   OT Duration 8 weeks      OT Treatment/Interventions Self-care/ADL training;Moist Heat;Fluidtherapy;DME and/or AE instruction;Balance training;Therapeutic activities;Aquatic Therapy;Ultrasound;Therapeutic exercise;Cognitive remediation/compensation;Visual/perceptual remediation/compensation;Functional Mobility Training;Neuromuscular education;Cryotherapy;Energy conservation;Manual Therapy;Patient/family education    Plan Aquatics to address safety, balance, visual compensation, and gross motor coordination   Consulted and Agree with Plan of Care Patient              Collier Salina, OT 12/17/2021, 3:19 PM

## 2021-12-17 NOTE — Patient Instructions (Addendum)
Medication Instructions:  Your physician recommends that you continue on your current medications as directed. Please refer to the Current Medication list given to you today.  Labwork: None ordered.  Testing/Procedures: None ordered.  Follow-Up:  Your physician wants you to follow-up in: 6 month Lewayne Bunting, MD.      Any Other Special Instructions Will Be Listed Below (If Applicable).  If you need a refill on your cardiac medications before your next appointment, please call your pharmacy.   Important Information About Sugar

## 2021-12-18 ENCOUNTER — Telehealth: Payer: Self-pay

## 2021-12-18 ENCOUNTER — Other Ambulatory Visit: Payer: Self-pay

## 2021-12-18 DIAGNOSIS — I480 Paroxysmal atrial fibrillation: Secondary | ICD-10-CM

## 2021-12-18 NOTE — Telephone Encounter (Signed)
Called to speak with patient to plan for LAAO.  He will need cCT for sizing.  Left message to call back.

## 2021-12-19 NOTE — Telephone Encounter (Signed)
Spoke with the patient and his wife. There are several dates to avoid for testing. Will arrange CT, call back to confirm date and time, and schedule blood work.  Dates to avoid: 7/6-7/9 7/13-7/16

## 2021-12-19 NOTE — Telephone Encounter (Signed)
Scheduled the patient 7/19 for pre-Watchman CT.  MyChart instructions sent to patient.

## 2021-12-19 NOTE — Telephone Encounter (Signed)
Left message to call back to confirm CT appointment and to schedule lab work.

## 2021-12-19 NOTE — Addendum Note (Signed)
Addended by: Gunnar Fusi A on: 12/19/2021 12:26 PM   Modules accepted: Orders

## 2021-12-19 NOTE — Addendum Note (Signed)
Addended by: Gunnar Fusi A on: 12/19/2021 11:44 AM   Modules accepted: Orders

## 2021-12-20 NOTE — Telephone Encounter (Signed)
Spoke with Ms. Abdalla. Scheduled the patient for BMET 6/28. She understands he may come any time between 0730 and 1630 for blood work and he does not need to be fasting.  Briefly reviewed CT instructions and she will review on MyChart as well.   She was grateful for assistance.

## 2021-12-23 ENCOUNTER — Encounter: Payer: Self-pay | Admitting: Neurology

## 2021-12-24 ENCOUNTER — Ambulatory Visit: Payer: Medicare Other | Admitting: Occupational Therapy

## 2021-12-24 DIAGNOSIS — R2681 Unsteadiness on feet: Secondary | ICD-10-CM

## 2021-12-24 DIAGNOSIS — R270 Ataxia, unspecified: Secondary | ICD-10-CM

## 2021-12-24 DIAGNOSIS — R278 Other lack of coordination: Secondary | ICD-10-CM

## 2021-12-24 DIAGNOSIS — R41842 Visuospatial deficit: Secondary | ICD-10-CM

## 2021-12-24 DIAGNOSIS — M6281 Muscle weakness (generalized): Secondary | ICD-10-CM

## 2021-12-25 ENCOUNTER — Encounter: Payer: Self-pay | Admitting: Occupational Therapy

## 2021-12-26 ENCOUNTER — Other Ambulatory Visit: Payer: Medicare Other

## 2021-12-26 DIAGNOSIS — I480 Paroxysmal atrial fibrillation: Secondary | ICD-10-CM

## 2021-12-27 LAB — BASIC METABOLIC PANEL
BUN/Creatinine Ratio: 12 (ref 10–24)
BUN: 12 mg/dL (ref 8–27)
CO2: 24 mmol/L (ref 20–29)
Calcium: 9.6 mg/dL (ref 8.6–10.2)
Chloride: 102 mmol/L (ref 96–106)
Creatinine, Ser: 1.02 mg/dL (ref 0.76–1.27)
Glucose: 139 mg/dL — ABNORMAL HIGH (ref 70–99)
Potassium: 4.7 mmol/L (ref 3.5–5.2)
Sodium: 142 mmol/L (ref 134–144)
eGFR: 81 mL/min/{1.73_m2} (ref >59)

## 2021-12-31 ENCOUNTER — Encounter: Payer: Self-pay | Admitting: Occupational Therapy

## 2021-12-31 ENCOUNTER — Ambulatory Visit: Payer: Medicare Other | Attending: Family Medicine | Admitting: Occupational Therapy

## 2021-12-31 DIAGNOSIS — R278 Other lack of coordination: Secondary | ICD-10-CM | POA: Diagnosis present

## 2021-12-31 DIAGNOSIS — M6281 Muscle weakness (generalized): Secondary | ICD-10-CM | POA: Diagnosis present

## 2021-12-31 DIAGNOSIS — R270 Ataxia, unspecified: Secondary | ICD-10-CM | POA: Insufficient documentation

## 2021-12-31 DIAGNOSIS — R41842 Visuospatial deficit: Secondary | ICD-10-CM | POA: Diagnosis present

## 2021-12-31 DIAGNOSIS — R2681 Unsteadiness on feet: Secondary | ICD-10-CM | POA: Diagnosis present

## 2021-12-31 NOTE — Therapy (Signed)
OUTPATIENT OCCUPATIONAL THERAPY TREATMENT    Patient Name: Walter [name] MRN: [id number] DOB:1955-07-15, 66 y.o., male Today's Date: 12/31/2021  PCP: Clovis Riley, L.August Saucer, MD REFERRING PROVIDER: Clovis Riley, Elbert Ewings.August Saucer, MD   OT End of Session - 12/31/21 1859     Visit Number 15    Number of Visits 25    Date for OT Re-Evaluation 01/28/22    Authorization Type Medicare Part A & B    Progress Note Due on Visit 20    OT Start Time 1415    OT Stop Time 1500    OT Time Calculation (min) 45 min    Equipment Utilized During Treatment floatation equipment    Activity Tolerance Patient tolerated treatment well    Behavior During Therapy WFL for tasks assessed/performed             Past Medical History:  Diagnosis Date   Atrial flutter (HCC)    s/p ablation   DVT (deep venous thrombosis) (HCC)    Near syncope 09/25/2013   Paroxysmal atrial fibrillation (HCC)    Stroke [facility])    initially hemorragic (not on OAC) followed by subsequent embolic stroke   Past Surgical History:  Procedure Laterality Date   A-FLUTTER ABLATION N/A 03/09/2020   Procedure: A-FLUTTER ABLATION;  Surgeon: Hillis Range, MD;  Location: [facility] INVASIVE CV LAB;  Service: Cardiovascular;  Laterality: N/A;   CARDIOVERSION N/A 09/27/2013   Procedure: CARDIOVERSION;  Surgeon: Lewayne Bunting, MD;  Location: [facility] ENDOSCOPY;  Service: Cardiovascular;  Laterality: N/A;   IR ANGIO EXTERNAL CAROTID SEL EXT CAROTID UNI R MOD SED  09/26/2020   IR ANGIO INTRA EXTRACRAN SEL COM CAROTID INNOMINATE BILAT MOD SED  09/26/2020   IR ANGIO VERTEBRAL SEL VERTEBRAL UNI R MOD SED  09/26/2020   IR IVC FILTER PLMT / S&I /IMG GUID/MOD SED  10/08/2020   IR IVC FILTER RETRIEVAL / S&I /IMG GUID/MOD SED  09/28/2021   IR IVUS EACH ADDITIONAL NON CORONARY VESSEL  11/02/2020   IR PTA VENOUS EXCEPT DIALYSIS CIRCUIT  11/02/2020   IR RADIOLOGIST EVAL & MGMT  01/17/2021   IR RADIOLOGIST EVAL & MGMT  09/12/2021   IR THROMBECT VENO MECH MOD SED  10/27/2020   IR THROMBECT  VENO MECH MOD SED  11/02/2020   IR US GUIDE VASC ACCESS LEFT  10/27/2020   IR US GUIDE VASC ACCESS RIGHT  09/26/2020   IR US GUIDE VASC ACCESS RIGHT  11/02/2020   IR VENO/EXT/UNI RIGHT  11/02/2020   IR VENOCAVAGRAM IVC  10/27/2020   RETINAL DETACHMENT SURGERY     right knee arthroscopy     TEE WITHOUT CARDIOVERSION N/A 09/27/2013   Procedure: TRANSESOPHAGEAL ECHOCARDIOGRAM (TEE);  Surgeon: Lewayne Bunting, MD;  Location: [facility] ENDOSCOPY;  Service: Cardiovascular;  Laterality: N/A;   WRIST SURGERY     Patient Active Problem List   Diagnosis Date Noted   Hypertension 08/16/2021   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   Residual cognitive deficit as late effect of stroke 04/19/2021   Hemiparesis affecting right side as late effect of stroke (HCC) 04/19/2021   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 04/19/2021   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 (HCC) 04/19/2021   Thrombosis    Sleep disturbance    Dysphagia, post-stroke    PAF (paroxysmal atrial fibrillation) (HCC)    Acute pulmonary embolism without acute cor pulmonale (HCC)    Malnutrition of moderate degree 11/04/2020   Tracheostomy care (HCC)    Pressure injury of skin 10/23/2020  Intracranial hemorrhage (HCC)    Atrial fibrillation (HCC)    Prediabetes    Leukocytosis    Acute blood loss anemia    Hypernatremia    ICH (intracerebral hemorrhage) (HCC) 09/25/2020   Unilateral primary osteoarthritis, left knee 09/26/2016   Atrial flutter (HCC) 09/25/2013   Near syncope 09/25/2013    ONSET DATE: 09/17/20  REFERRING DIAG: I62.9 (ICD-10-CM) - Intracranial hemorrhage (HCC)  THERAPY DIAG:  Ataxia  Unsteadiness on feet  Other lack of coordination  Muscle weakness (generalized)  Visuospatial deficit   PERTINENT HISTORY: ICH of brainstem (complicated by hydrocephalus, DVTs, and tracheostomy).  PMH:  a-fib   PRECAUTIONS: fall risk, ataxia, impulsivity, diplopia    SUBJECTIVE: Pt arrived with USTEP  walker today.  PAIN:  Are you having pain? No     OBJECTIVE:   TODAY'S TREATMENT:  12/321   Patient seen for aquatic therapy visit today.  Patient entered and exited the pool via stairs and two railings, with cueing and intermittent assistance .  Patient using step to pattern initially, but step through pattern on exit with min assist.  Worked to maintain body in midline during stairs.  Skilled OT intervention with emphasis on maintaining balance initially in deep water 4 ft 8 inches and 4 ft 5 inches  Warm up with water walking, jogging - to increase cardiovascular challenge.  Added gross motor coordination of limbs to address walking with dumbbells in each hand and moving opposite arm and leg together.  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  Patient with balance improvement evident in deeper (4 ft and above) water.  Patient continues to report falling at home.  Patient indicates that at times he completes self care tasks without supervision if he feels wife has too much on her plate.  Had overt conversation about reducing falls.                 OT Short Term Goals - 09/19/21 1424       OT SHORT TERM GOAL #1   Title Pt will be independent with HEP for LUE coordination    Time 4    Period Weeks    Status Achieved   reviewed coordination HEP 09/19/21   Target Date 10/03/21      OT SHORT TERM GOAL #2   Title Pt will verbalize understanding of sensory strategies for RUE sensory deficits.    Time 4    Period Weeks    Status Achieved pt reports testing with LUE first for temperature. 09/24/21     OT SHORT TERM GOAL #3   Title Pt will improve LUE coordination for ADLs as shown by improving score on box and blocks test by at least 6.    Baseline L 18 R 39    Time 4    Period Weeks    Status Achieved  LUE 22 blocks              OT Long Term Goals - 09/05/21 1459       OT LONG TERM GOAL #1   Title Pt will be independent with Aquatics HEP PRN    Time 12     Period Weeks    Status Ongoing   Target Date 01/28/22      OT LONG TERM GOAL #2   Title Pt will improve LUE hand coordination for ADLs as shown by completing 9-hole peg test in less than 90 seconds    Baseline 5 pegs in 2 minutes with  several drops and knocking pegs out    Time 12    Period Weeks    Status Deferred  Working on coordination     OT LONG TERM GOAL #3   Title Pt will verbalize understanding of visual compensation strategies and functional scanning strategies for increasing safety with ADLs and IADLs.    Time 12    Period Weeks    Status Ongoing  Continue progress     OT LONG TERM GOAL #4   Title Pt will perform environmental scanning with 90% accuracy for increased safety.  Time 12   Period Weeks   Status Ongoing         OT LONG TERM GOAL #5   Title Pt will demo good safety awareness/compensation for sensory deficits and attention to LUE during functional tasks and transfers without cueing.    Time 12    Period Weeks    Status Ongoing     OT LONG TERM GOAL #6   Title Pt will report increased ease and accuracy with handwriting and texting with visual strategies and compensations as needed.    Time 12    Period Weeks    Status Ongoing              Plan - 09/24/21 1354     Clinical Impression Statement Patient is showing improved balance and overall endurance in aquatic environment.  Patient continues to have frequent falls.  Continue to work toward more autonomy and safety for home program.     OT Occupational Profile and History Detailed Assessment- Review of Records and additional review of physical, cognitive, psychosocial history related to current functional performance    Occupational performance deficits (Please refer to evaluation for details): ADL's;IADL's;Work;Leisure;Social Participation    Body Structure / Function / Physical Skills ADL;Strength;Balance;Proprioception;UE functional use;IADL;Endurance;Vision;Mobility;Coordination;Decreased  knowledge of precautions;FMC;GMC;Sensation    Cognitive Skills Attention;Safety Awareness;Memory;Perception;Problem Solve    Rehab Potential Fair   d/t duration from initial event   Clinical Decision Making Several treatment options, min-mod task modification necessary    Comorbidities Affecting Occupational Performance: May have comorbidities impacting occupational performance    Modification or Assistance to Complete Evaluation  Min-Moderate modification of tasks or assist with assess necessary to complete eval    OT Frequency Other (comment)   1x/wk   OT Duration 8 weeks      OT Treatment/Interventions Self-care/ADL training;Moist Heat;Fluidtherapy;DME and/or AE instruction;Balance training;Therapeutic activities;Aquatic Therapy;Ultrasound;Therapeutic exercise;Cognitive remediation/compensation;Visual/perceptual remediation/compensation;Functional Mobility Training;Neuromuscular education;Cryotherapy;Energy conservation;Manual Therapy;Patient/family education    Plan Aquatics to address safety, balance, visual compensation, and gross motor coordination   Consulted and Agree with Plan of Care Patient              Collier Salina, OT 12/31/2021, 7:01 PM

## 2022-01-07 ENCOUNTER — Ambulatory Visit: Payer: Medicare Other | Admitting: Occupational Therapy

## 2022-01-07 ENCOUNTER — Encounter: Payer: Self-pay | Admitting: Occupational Therapy

## 2022-01-07 DIAGNOSIS — R2681 Unsteadiness on feet: Secondary | ICD-10-CM

## 2022-01-07 DIAGNOSIS — R270 Ataxia, unspecified: Secondary | ICD-10-CM

## 2022-01-07 DIAGNOSIS — R41842 Visuospatial deficit: Secondary | ICD-10-CM

## 2022-01-07 DIAGNOSIS — R278 Other lack of coordination: Secondary | ICD-10-CM

## 2022-01-07 DIAGNOSIS — M6281 Muscle weakness (generalized): Secondary | ICD-10-CM

## 2022-01-07 NOTE — Therapy (Cosign Needed)
OUTPATIENT OCCUPATIONAL THERAPY TREATMENT    Patient Name: Walter [name] MRN: [id number] DOB:26-Jan-1956, 66 y.o., male Today's Date: 01/07/2022  PCP: Clovis Riley, L.August Saucer, MD REFERRING PROVIDER: Clovis Riley, Elbert Ewings.August Saucer, MD   OT End of Session - 01/07/22 1812     Visit Number 16    Number of Visits 25    Date for OT Re-Evaluation 01/28/22    Authorization Type Medicare Part A & B    Progress Note Due on Visit 20    OT Start Time 1417    OT Stop Time 1500    OT Time Calculation (min) 43 min    Activity Tolerance Patient tolerated treatment well    Behavior During Therapy WFL for tasks assessed/performed             Past Medical History:  Diagnosis Date   Atrial flutter (HCC)    s/p ablation   DVT (deep venous thrombosis) (HCC)    Near syncope 09/25/2013   Paroxysmal atrial fibrillation (HCC)    Stroke [facility])    initially hemorragic (not on OAC) followed by subsequent embolic stroke   Past Surgical History:  Procedure Laterality Date   A-FLUTTER ABLATION N/A 03/09/2020   Procedure: A-FLUTTER ABLATION;  Surgeon: Hillis Range, MD;  Location: [facility] INVASIVE CV LAB;  Service: Cardiovascular;  Laterality: N/A;   CARDIOVERSION N/A 09/27/2013   Procedure: CARDIOVERSION;  Surgeon: Lewayne Bunting, MD;  Location: [facility] ENDOSCOPY;  Service: Cardiovascular;  Laterality: N/A;   IR ANGIO EXTERNAL CAROTID SEL EXT CAROTID UNI R MOD SED  09/26/2020   IR ANGIO INTRA EXTRACRAN SEL COM CAROTID INNOMINATE BILAT MOD SED  09/26/2020   IR ANGIO VERTEBRAL SEL VERTEBRAL UNI R MOD SED  09/26/2020   IR IVC FILTER PLMT / S&I /IMG GUID/MOD SED  10/08/2020   IR IVC FILTER RETRIEVAL / S&I /IMG GUID/MOD SED  09/28/2021   IR IVUS EACH ADDITIONAL NON CORONARY VESSEL  11/02/2020   IR PTA VENOUS EXCEPT DIALYSIS CIRCUIT  11/02/2020   IR RADIOLOGIST EVAL & MGMT  01/17/2021   IR RADIOLOGIST EVAL & MGMT  09/12/2021   IR THROMBECT VENO MECH MOD SED  10/27/2020   IR THROMBECT VENO MECH MOD SED  11/02/2020   IR US GUIDE VASC ACCESS LEFT   10/27/2020   IR US GUIDE VASC ACCESS RIGHT  09/26/2020   IR US GUIDE VASC ACCESS RIGHT  11/02/2020   IR VENO/EXT/UNI RIGHT  11/02/2020   IR VENOCAVAGRAM IVC  10/27/2020   RETINAL DETACHMENT SURGERY     right knee arthroscopy     TEE WITHOUT CARDIOVERSION N/A 09/27/2013   Procedure: TRANSESOPHAGEAL ECHOCARDIOGRAM (TEE);  Surgeon: Lewayne Bunting, MD;  Location: [facility] ENDOSCOPY;  Service: Cardiovascular;  Laterality: N/A;   WRIST SURGERY     Patient Active Problem List   Diagnosis Date Noted   Hypertension 08/16/2021   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   Residual cognitive deficit as late effect of stroke 04/19/2021   Hemiparesis affecting right side as late effect of stroke (HCC) 04/19/2021   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 04/19/2021   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 (HCC) 04/19/2021   Thrombosis    Sleep disturbance    Dysphagia, post-stroke    PAF (paroxysmal atrial fibrillation) (HCC)    Acute pulmonary embolism without acute cor pulmonale (HCC)    Malnutrition of moderate degree 11/04/2020   Tracheostomy care (HCC)    Pressure injury of skin 10/23/2020   Intracranial hemorrhage (HCC)    Atrial fibrillation (HCC)  Prediabetes    Leukocytosis    Acute blood loss anemia    Hypernatremia    ICH (intracerebral hemorrhage) (HCC) 09/25/2020   Unilateral primary osteoarthritis, left knee 09/26/2016   Atrial flutter (HCC) 09/25/2013   Near syncope 09/25/2013    ONSET DATE: 09/17/20  REFERRING DIAG: I62.9 (ICD-10-CM) - Intracranial hemorrhage (HCC)  THERAPY DIAG:  Ataxia  Unsteadiness on feet  Other lack of coordination  Muscle weakness (generalized)  Visuospatial deficit   PERTINENT HISTORY: ICH of brainstem (complicated by hydrocephalus, DVTs, and tracheostomy).  PMH:  a-fib   PRECAUTIONS: fall risk, ataxia, impulsivity, diplopia    SUBJECTIVE: Pt arrived with USTEP walker today.  PAIN:  Are you having pain?  No     OBJECTIVE:   TODAY'S TREATMENT:  01/07/22   Patient seen for aquatic therapy visit today.  Patient entered and exited the pool via stairs and two railings, with cueing and intermittent assistance .  Patient using step through pattern with min assist and cueing for arm advancement.   Skilled OT intervention with emphasis on maintaining balance initially in deep water 4 ft 8 inches and 4 ft 5 inches  Warm up with water walking, jogging - to increase cardiovascular challenge.    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  Worked on higher level balance challenges in chest deep water for maximum buyoancy and support.  Worked on stepping up onto underwater bench then standing on bench with hip, knee and trunk extension.  Worked on transitional movements to transition sit to/from stand - in midline with sufficient forward flexion, and with controlled descent.             OT Short Term Goals - 09/19/21 1424       OT SHORT TERM GOAL #1   Title Pt will be independent with HEP for LUE coordination    Time 4    Period Weeks    Status Achieved   reviewed coordination HEP 09/19/21   Target Date 10/03/21      OT SHORT TERM GOAL #2   Title Pt will verbalize understanding of sensory strategies for RUE sensory deficits.    Time 4    Period Weeks    Status Achieved pt reports testing with LUE first for temperature. 09/24/21     OT SHORT TERM GOAL #3   Title Pt will improve LUE coordination for ADLs as shown by improving score on box and blocks test by at least 6.    Baseline L 18 R 39    Time 4    Period Weeks    Status Achieved  LUE 22 blocks              OT Long Term Goals - 09/05/21 1459       OT LONG TERM GOAL #1   Title Pt will be independent with Aquatics HEP PRN    Time 12    Period Weeks    Status Ongoing   Target Date 01/28/22      OT LONG TERM GOAL #2   Title Pt will improve LUE hand coordination for ADLs as shown by completing 9-hole peg test in less  than 90 seconds    Baseline 5 pegs in 2 minutes with several drops and knocking pegs out    Time 12    Period Weeks    Status Deferred  Working on coordination     OT LONG TERM GOAL #3   Title Pt will verbalize understanding of  visual compensation strategies and functional scanning strategies for increasing safety with ADLs and IADLs.    Time 12    Period Weeks    Status Ongoing  Continue progress     OT LONG TERM GOAL #4   Title Pt will perform environmental scanning with 90% accuracy for increased safety.  Time 12   Period Weeks   Status Ongoing         OT LONG TERM GOAL #5   Title Pt will demo good safety awareness/compensation for sensory deficits and attention to LUE during functional tasks and transfers without cueing.    Time 12    Period Weeks    Status Ongoing     OT LONG TERM GOAL #6   Title Pt will report increased ease and accuracy with handwriting and texting with visual strategies and compensations as needed.    Time 12    Period Weeks    Status Ongoing              Plan - 09/24/21 1354     Clinical Impression Statement Patient is showing improved balance and overall endurance in aquatic environment.  Patient continues to have frequent falls.  Continue to work toward more autonomy and safety for home program.     OT Occupational Profile and History Detailed Assessment- Review of Records and additional review of physical, cognitive, psychosocial history related to current functional performance    Occupational performance deficits (Please refer to evaluation for details): ADL's;IADL's;Work;Leisure;Social Participation    Body Structure / Function / Physical Skills ADL;Strength;Balance;Proprioception;UE functional use;IADL;Endurance;Vision;Mobility;Coordination;Decreased knowledge of precautions;FMC;GMC;Sensation    Cognitive Skills Attention;Safety Awareness;Memory;Perception;Problem Solve    Rehab Potential Fair   d/t duration from initial event   Clinical  Decision Making Several treatment options, min-mod task modification necessary    Comorbidities Affecting Occupational Performance: May have comorbidities impacting occupational performance    Modification or Assistance to Complete Evaluation  Min-Moderate modification of tasks or assist with assess necessary to complete eval    OT Frequency Other (comment)   1x/wk   OT Duration 8 weeks      OT Treatment/Interventions Self-care/ADL training;Moist Heat;Fluidtherapy;DME and/or AE instruction;Balance training;Therapeutic activities;Aquatic Therapy;Ultrasound;Therapeutic exercise;Cognitive remediation/compensation;Visual/perceptual remediation/compensation;Functional Mobility Training;Neuromuscular education;Cryotherapy;Energy conservation;Manual Therapy;Patient/family education    Plan Aquatics to address safety, balance, visual compensation, and gross motor coordination   Consulted and Agree with Plan of Care Patient              Collier Salina, OT 01/07/2022, 6:13 PM

## 2022-01-14 ENCOUNTER — Ambulatory Visit: Payer: Medicare Other | Admitting: Occupational Therapy

## 2022-01-14 ENCOUNTER — Other Ambulatory Visit: Payer: Self-pay | Admitting: Neurology

## 2022-01-14 ENCOUNTER — Encounter: Payer: Self-pay | Admitting: Occupational Therapy

## 2022-01-14 DIAGNOSIS — M6281 Muscle weakness (generalized): Secondary | ICD-10-CM

## 2022-01-14 DIAGNOSIS — R2681 Unsteadiness on feet: Secondary | ICD-10-CM

## 2022-01-14 DIAGNOSIS — R41842 Visuospatial deficit: Secondary | ICD-10-CM

## 2022-01-14 DIAGNOSIS — R270 Ataxia, unspecified: Secondary | ICD-10-CM

## 2022-01-14 DIAGNOSIS — I639 Cerebral infarction, unspecified: Secondary | ICD-10-CM

## 2022-01-14 DIAGNOSIS — R278 Other lack of coordination: Secondary | ICD-10-CM

## 2022-01-14 NOTE — Therapy (Signed)
OUTPATIENT OCCUPATIONAL THERAPY TREATMENT    Patient Name: Walter [name] MRN: [id number] DOB:1956-05-14, 66 y.o., male Today's Date: 01/14/2022  PCP: Clovis Riley, L.August Saucer, MD REFERRING PROVIDER: Clovis Riley, Elbert Ewings.August Saucer, MD   OT End of Session - 01/14/22 1514     Visit Number 17    Number of Visits 25    Date for OT Re-Evaluation 01/28/22    Authorization Type Medicare Part A & B    Progress Note Due on Visit 20    OT Start Time 1415    OT Stop Time 1500    OT Time Calculation (min) 45 min    Activity Tolerance Patient tolerated treatment well    Behavior During Therapy WFL for tasks assessed/performed             Past Medical History:  Diagnosis Date   Atrial flutter (HCC)    s/p ablation   DVT (deep venous thrombosis) (HCC)    Near syncope 09/25/2013   Paroxysmal atrial fibrillation (HCC)    Stroke [facility])    initially hemorragic (not on OAC) followed by subsequent embolic stroke   Past Surgical History:  Procedure Laterality Date   A-FLUTTER ABLATION N/A 03/09/2020   Procedure: A-FLUTTER ABLATION;  Surgeon: Hillis Range, MD;  Location: [facility] INVASIVE CV LAB;  Service: Cardiovascular;  Laterality: N/A;   CARDIOVERSION N/A 09/27/2013   Procedure: CARDIOVERSION;  Surgeon: Lewayne Bunting, MD;  Location: [facility] ENDOSCOPY;  Service: Cardiovascular;  Laterality: N/A;   IR ANGIO EXTERNAL CAROTID SEL EXT CAROTID UNI R MOD SED  09/26/2020   IR ANGIO INTRA EXTRACRAN SEL COM CAROTID INNOMINATE BILAT MOD SED  09/26/2020   IR ANGIO VERTEBRAL SEL VERTEBRAL UNI R MOD SED  09/26/2020   IR IVC FILTER PLMT / S&I /IMG GUID/MOD SED  10/08/2020   IR IVC FILTER RETRIEVAL / S&I /IMG GUID/MOD SED  09/28/2021   IR IVUS EACH ADDITIONAL NON CORONARY VESSEL  11/02/2020   IR PTA VENOUS EXCEPT DIALYSIS CIRCUIT  11/02/2020   IR RADIOLOGIST EVAL & MGMT  01/17/2021   IR RADIOLOGIST EVAL & MGMT  09/12/2021   IR THROMBECT VENO MECH MOD SED  10/27/2020   IR THROMBECT VENO MECH MOD SED  11/02/2020   IR US GUIDE VASC ACCESS LEFT   10/27/2020   IR US GUIDE VASC ACCESS RIGHT  09/26/2020   IR US GUIDE VASC ACCESS RIGHT  11/02/2020   IR VENO/EXT/UNI RIGHT  11/02/2020   IR VENOCAVAGRAM IVC  10/27/2020   RETINAL DETACHMENT SURGERY     right knee arthroscopy     TEE WITHOUT CARDIOVERSION N/A 09/27/2013   Procedure: TRANSESOPHAGEAL ECHOCARDIOGRAM (TEE);  Surgeon: Lewayne Bunting, MD;  Location: [facility] ENDOSCOPY;  Service: Cardiovascular;  Laterality: N/A;   WRIST SURGERY     Patient Active Problem List   Diagnosis Date Noted   Hypertension 08/16/2021   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   Residual cognitive deficit as late effect of stroke 04/19/2021   Hemiparesis affecting right side as late effect of stroke (HCC) 04/19/2021   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 04/19/2021   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 (HCC) 04/19/2021   Thrombosis    Sleep disturbance    Dysphagia, post-stroke    PAF (paroxysmal atrial fibrillation) (HCC)    Acute pulmonary embolism without acute cor pulmonale (HCC)    Malnutrition of moderate degree 11/04/2020   Tracheostomy care (HCC)    Pressure injury of skin 10/23/2020   Intracranial hemorrhage (HCC)    Atrial fibrillation (HCC)  Prediabetes    Leukocytosis    Acute blood loss anemia    Hypernatremia    ICH (intracerebral hemorrhage) (HCC) 09/25/2020   Unilateral primary osteoarthritis, left knee 09/26/2016   Atrial flutter (HCC) 09/25/2013   Near syncope 09/25/2013    ONSET DATE: 09/17/20  REFERRING DIAG: I62.9 (ICD-10-CM) - Intracranial hemorrhage (HCC)  THERAPY DIAG:  Ataxia  Unsteadiness on feet  Muscle weakness (generalized)  Other lack of coordination  Visuospatial deficit   PERTINENT HISTORY: ICH of brainstem (complicated by hydrocephalus, DVTs, and tracheostomy).  PMH:  a-fib   PRECAUTIONS: fall risk, ataxia, impulsivity, diplopia    SUBJECTIVE: Pt arrived with USTEP walker today.  PAIN:  Are you having pain?  No     OBJECTIVE:   TODAY'S TREATMENT:  01/14/22   Patient seen for aquatic therapy visit today.  Patient entered and exited the pool via stairs and two railings, with cueing and intermittent assistance .  Patient using step through pattern with min assist and cueing for arm advancement.   Skilled OT intervention with emphasis on maintaining balance initially in deep water 4 ft 8 inches and 4 ft 5 inches  Emphasis today on managing mass over base of support with simple transitional movements.  Patient having difficulty initially with stepping up onto underwater bench leading with RUE.  Patient consistently with strong weight shift toward right and then loss of balance.  Gave example and overt cueing about moving mass to left and forward to step up.  After repetition, patient able to safely step up with either right or left foot and only intermittent contact guard.  Worked on walking with simulated balance beam to help narrow base of support, and to help direct left foot from movement inward toward left - which leads to consistent loss of balance.  Patient needed cueing and rote practice to obtain balance before starting exercise - but did well with repetition.  Continuing to work on gross motor coordination of UE/LE during straight forward walking in 4 ft and above.        OT Short Term Goals - 09/19/21 1424       OT SHORT TERM GOAL #1   Title Pt will be independent with HEP for LUE coordination    Time 4    Period Weeks    Status Achieved   reviewed coordination HEP 09/19/21   Target Date 10/03/21      OT SHORT TERM GOAL #2   Title Pt will verbalize understanding of sensory strategies for RUE sensory deficits.    Time 4    Period Weeks    Status Achieved pt reports testing with LUE first for temperature. 09/24/21     OT SHORT TERM GOAL #3   Title Pt will improve LUE coordination for ADLs as shown by improving score on box and blocks test by at least 6.    Baseline L 18 R 39    Time 4     Period Weeks    Status Achieved  LUE 22 blocks              OT Long Term Goals - 09/05/21 1459       OT LONG TERM GOAL #1   Title Pt will be independent with Aquatics HEP PRN    Time 12    Period Weeks    Status Ongoing   Target Date 01/28/22      OT LONG TERM GOAL #2   Title Pt will improve LUE hand coordination  for ADLs as shown by completing 9-hole peg test in less than 90 seconds    Baseline 5 pegs in 2 minutes with several drops and knocking pegs out    Time 12    Period Weeks    Status Deferred  Working on coordination     OT LONG TERM GOAL #3   Title Pt will verbalize understanding of visual compensation strategies and functional scanning strategies for increasing safety with ADLs and IADLs.    Time 12    Period Weeks    Status Ongoing  Continue progress     OT LONG TERM GOAL #4   Title Pt will perform environmental scanning with 90% accuracy for increased safety.  Time 12   Period Weeks   Status Ongoing         OT LONG TERM GOAL #5   Title Pt will demo good safety awareness/compensation for sensory deficits and attention to LUE during functional tasks and transfers without cueing.    Time 12    Period Weeks    Status Ongoing     OT LONG TERM GOAL #6   Title Pt will report increased ease and accuracy with handwriting and texting with visual strategies and compensations as needed.    Time 12    Period Weeks    Status Ongoing              Plan - 09/24/21 1354     Clinical Impression Statement Patient is showing improved balance and overall endurance in aquatic environment.  Patient continues to have frequent falls.  Continue to work toward more autonomy and safety for home program.     OT Occupational Profile and History Detailed Assessment- Review of Records and additional review of physical, cognitive, psychosocial history related to current functional performance    Occupational performance deficits (Please refer to evaluation for details):  ADL's;IADL's;Work;Leisure;Social Participation    Body Structure / Function / Physical Skills ADL;Strength;Balance;Proprioception;UE functional use;IADL;Endurance;Vision;Mobility;Coordination;Decreased knowledge of precautions;FMC;GMC;Sensation    Cognitive Skills Attention;Safety Awareness;Memory;Perception;Problem Solve    Rehab Potential Fair   d/t duration from initial event   Clinical Decision Making Several treatment options, min-mod task modification necessary    Comorbidities Affecting Occupational Performance: May have comorbidities impacting occupational performance    Modification or Assistance to Complete Evaluation  Min-Moderate modification of tasks or assist with assess necessary to complete eval    OT Frequency Other (comment)   1x/wk   OT Duration 8 weeks      OT Treatment/Interventions Self-care/ADL training;Moist Heat;Fluidtherapy;DME and/or AE instruction;Balance training;Therapeutic activities;Aquatic Therapy;Ultrasound;Therapeutic exercise;Cognitive remediation/compensation;Visual/perceptual remediation/compensation;Functional Mobility Training;Neuromuscular education;Cryotherapy;Energy conservation;Manual Therapy;Patient/family education    Plan Aquatics to address safety, balance, visual compensation, and gross motor coordination   Consulted and Agree with Plan of Care Patient              Collier Salina, OT 01/14/2022, 3:16 PM

## 2022-01-15 ENCOUNTER — Telehealth (HOSPITAL_COMMUNITY): Payer: Self-pay | Admitting: Emergency Medicine

## 2022-01-15 ENCOUNTER — Telehealth: Payer: Self-pay | Admitting: Neurology

## 2022-01-15 NOTE — Telephone Encounter (Signed)
Reaching out to patient to offer assistance regarding upcoming cardiac imaging study; pt verbalizes understanding of appt date/time, parking situation and where to check in, pre-test NPO status and medications ordered, and verified current allergies; name and call back number provided for further questions should they arise Rockwell Alexandria RN Navigator Cardiac Imaging [facility] [phone] office [phone] cell  Denies iv issues Arrival 930 Daily meds

## 2022-01-15 NOTE — Telephone Encounter (Signed)
Referral for aquatic therapy sent to [facility] in [place] [phone]

## 2022-01-15 NOTE — Telephone Encounter (Signed)
Attempted to call patient regarding upcoming cardiac CT appointment. °Left message on voicemail with name and callback number °Vaiden Adames RN Navigator Cardiac Imaging °[facility] °[phone] Office °[phone] Cell ° °

## 2022-01-16 ENCOUNTER — Ambulatory Visit (HOSPITAL_COMMUNITY)
Admission: RE | Admit: 2022-01-16 | Discharge: 2022-01-16 | Disposition: A | Discharge status: Home / Self Care | Payer: Medicare Other | Source: Ambulatory Visit | Attending: Cardiology | Admitting: Cardiology

## 2022-01-16 DIAGNOSIS — I480 Paroxysmal atrial fibrillation: Secondary | ICD-10-CM

## 2022-01-16 MED ORDER — IOHEXOL 350 MG/ML SOLN
100.0000 mL | Freq: Once | INTRAVENOUS | Status: AC | PRN
  Administered 2022-01-16: 100 mL via INTRAVENOUS

## 2022-01-17 ENCOUNTER — Telehealth: Payer: Self-pay

## 2022-01-17 NOTE — Telephone Encounter (Signed)
Per [employer] review of 01/16/2022 cCT: "Watchman CT ([employer]) report for [employer] * shallow, could be tricky *  Broccoli morphology  Ostium of 26mm AVG and 14mm MAX Depth to around 58mm  Inferior (SI) and mid (AP) stick, a double curve sheath, and a 63mm or 18mm Watchman FLX"

## 2022-01-25 ENCOUNTER — Other Ambulatory Visit: Payer: Self-pay | Admitting: Neurology

## 2022-01-25 DIAGNOSIS — R296 Repeated falls: Secondary | ICD-10-CM

## 2022-01-28 ENCOUNTER — Ambulatory Visit: Payer: Medicare Other | Admitting: Occupational Therapy

## 2022-01-28 ENCOUNTER — Encounter: Payer: Self-pay | Admitting: Occupational Therapy

## 2022-01-28 DIAGNOSIS — R41842 Visuospatial deficit: Secondary | ICD-10-CM

## 2022-01-28 DIAGNOSIS — R270 Ataxia, unspecified: Secondary | ICD-10-CM

## 2022-01-28 DIAGNOSIS — R2681 Unsteadiness on feet: Secondary | ICD-10-CM

## 2022-01-28 DIAGNOSIS — R278 Other lack of coordination: Secondary | ICD-10-CM

## 2022-01-28 DIAGNOSIS — M6281 Muscle weakness (generalized): Secondary | ICD-10-CM

## 2022-01-28 NOTE — Therapy (Signed)
OUTPATIENT OCCUPATIONAL THERAPY TREATMENT  AND DISCHARGE NOTE   Patient Name: Walter [name] MRN: [id number] DOB:[birth year], 66 y.o., male Today's Date: 01/28/2022  PCP: Alroy Dust, L.Marlou Sa, MD REFERRING PROVIDER: Alroy Dust, Carlean Jews.Marlou Sa, MD   OT End of Session - 01/28/22 1848     Visit Number 18    Number of Visits 25    Date for OT Re-Evaluation 01/28/22    OT Start Time 1415    OT Stop Time 1500    OT Time Calculation (min) 45 min    Equipment Utilized During Treatment floatation equipment    Activity Tolerance Patient tolerated treatment well    Behavior During Therapy WFL for tasks assessed/performed             Past Medical History:  Diagnosis Date   Atrial flutter ([place])    s/p ablation   DVT (deep venous thrombosis) ([place])    Near syncope 09/25/2013   Paroxysmal atrial fibrillation ([place])    Stroke [name])    initially hemorragic (not on [place]) followed by subsequent embolic stroke   Past Surgical History:  Procedure Laterality Date   A-FLUTTER ABLATION N/A 03/09/2020   Procedure: A-FLUTTER ABLATION;  Surgeon: Thompson Grayer, MD;  Location: [place] CV LAB;  Service: Cardiovascular;  Laterality: N/A;   CARDIOVERSION N/A 09/27/2013   Procedure: CARDIOVERSION;  Surgeon: Lelon Perla, MD;  Location: [facility] ENDOSCOPY;  Service: Cardiovascular;  Laterality: N/A;   IR ANGIO EXTERNAL CAROTID SEL EXT CAROTID UNI R MOD SED  09/26/2020   IR ANGIO INTRA EXTRACRAN SEL COM CAROTID INNOMINATE BILAT MOD SED  09/26/2020   IR ANGIO VERTEBRAL SEL VERTEBRAL UNI R MOD SED  09/26/2020   IR IVC FILTER PLMT / S&I /IMG GUID/MOD SED  10/08/2020   IR IVC FILTER RETRIEVAL / S&I /IMG GUID/MOD SED  09/28/2021   IR IVUS EACH ADDITIONAL NON CORONARY VESSEL  11/02/2020   IR PTA VENOUS EXCEPT DIALYSIS CIRCUIT  11/02/2020   IR RADIOLOGIST EVAL & MGMT  01/17/2021   IR RADIOLOGIST EVAL & MGMT  09/12/2021   IR THROMBECT VENO MECH MOD SED  10/27/2020   IR THROMBECT VENO MECH MOD SED  11/02/2020   IR US GUIDE VASC ACCESS  LEFT  10/27/2020   IR US GUIDE VASC ACCESS RIGHT  09/26/2020   IR US GUIDE VASC ACCESS RIGHT  11/02/2020   IR VENO/EXT/UNI RIGHT  11/02/2020   IR VENOCAVAGRAM IVC  10/27/2020   RETINAL DETACHMENT SURGERY     right knee arthroscopy     TEE WITHOUT CARDIOVERSION N/A 09/27/2013   Procedure: TRANSESOPHAGEAL ECHOCARDIOGRAM (TEE);  Surgeon: Lelon Perla, MD;  Location: [facility] ENDOSCOPY;  Service: Cardiovascular;  Laterality: N/A;   WRIST SURGERY     Patient Active Problem List   Diagnosis Date Noted   Hypertension 08/16/2021   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   Residual cognitive deficit as late effect of stroke 04/19/2021   Hemiparesis affecting right side as late effect of stroke ([place]) 04/19/2021   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 04/19/2021   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 ([place]) 04/19/2021   Thrombosis    Sleep disturbance    Dysphagia, post-stroke    PAF (paroxysmal atrial fibrillation) ([place])    Acute pulmonary embolism without acute cor pulmonale (HCC)    Malnutrition of moderate degree 11/04/2020   Tracheostomy care ([place])    Pressure injury of skin 10/23/2020   Intracranial hemorrhage (HCC)    Atrial fibrillation (HCC)    Prediabetes  Leukocytosis    Acute blood loss anemia    Hypernatremia    ICH (intracerebral hemorrhage) ([place]) 09/25/2020   Unilateral primary osteoarthritis, left knee 09/26/2016   Atrial flutter ([place]) 09/25/2013   Near syncope 09/25/2013    ONSET DATE: 09/17/20  REFERRING DIAG: I62.9 (ICD-10-CM) - Intracranial hemorrhage (HCC)  THERAPY DIAG:  Ataxia  Unsteadiness on feet  Muscle weakness (generalized)  Other lack of coordination  Visuospatial deficit   PERTINENT HISTORY: ICH of brainstem (complicated by hydrocephalus, DVTs, and tracheostomy).  PMH:  a-fib   PRECAUTIONS: fall risk, ataxia, impulsivity, diplopia    SUBJECTIVE: Pt arrived with USTEP walker today.  PAIN:  Are you having pain?  No     OBJECTIVE:   TODAY'S TREATMENT:  01/28/22   Patient seen for aquatic therapy visit today.  Patient entered and exited the pool via stairs and two railings, with cueing and intermittent assistance to progress left arm forward.     Skilled OT intervention with emphasis on maintaining balance initially in deep water 4 ft 8 inches and 4 ft 5 inches  Warm up with water walking - forward, side stepping backward, without UE then with UEs on dumbbells.  Gradually worked on same skills in Theatre manager.  Worked on higher level balance challenges in shallower water this session - stepping up x2 steps and controlling descent back down - eccentric control of weight bearing leg.  Worked on transitional movements to transition sit to/from stand - in midline with sufficient forward flexion, and with controlled descent.   Patient will discharge as planned from OT at this time to pursue PT evaluation.  Patient has partially met his OT goals, and is agreeable to discharge.             OT Short Term Goals - 09/19/21 1424       OT SHORT TERM GOAL #1   Title Pt will be independent with HEP for LUE coordination    Time 4    Period Weeks    Status Achieved   reviewed coordination HEP 09/19/21   Target Date 10/03/21      OT SHORT TERM GOAL #2   Title Pt will verbalize understanding of sensory strategies for RUE sensory deficits.    Time 4    Period Weeks    Status Achieved pt reports testing with LUE first for temperature. 09/24/21     OT SHORT TERM GOAL #3   Title Pt will improve LUE coordination for ADLs as shown by improving score on box and blocks test by at least 6.    Baseline L 18 R 39    Time 4    Period Weeks    Status Achieved  LUE 22 blocks              OT Long Term Goals - 09/05/21 1459       OT LONG TERM GOAL #1   Title Pt will be independent with Aquatics HEP PRN    Time 12    Period Weeks    Status MET   Target Date 01/28/22     OT LONG TERM GOAL #2   Title  Pt will improve LUE hand coordination for ADLs as shown by completing 9-hole peg test in less than 90 seconds    Baseline 5 pegs in 2 minutes with several drops and knocking pegs out    Time 12    Period Weeks    Status Deferred  Working on coordination  OT LONG TERM GOAL #3   Title Pt will verbalize understanding of visual compensation strategies and functional scanning strategies for increasing safety with ADLs and IADLs.    Time 12    Period Weeks    Status MET     OT LONG TERM GOAL #4   Title Pt will perform environmental scanning with 90% accuracy for increased safety.  Time 12   Period Weeks   Status MET         OT LONG TERM GOAL #5   Title Pt will demo good safety awareness/compensation for sensory deficits and attention to LUE during functional tasks and transfers without cueing.    Time 12    Period Weeks    Status NOT MET - FREQUENT FALLS     OT LONG TERM GOAL #6   Title Pt will report increased ease and accuracy with handwriting and texting with visual strategies and compensations as needed.    Time 12    Period Weeks    Status NOT MET             Plan - 09/24/21 1354     Clinical Impression Statement Patient had a fall last week which resulted in an ED visit, and staples to head.  Have secured a PT referral and patient will be re-evaluated 01/29/22.  Patient has made SIGNIFICANT improvement in gross motor coordination, postural control and even balance in pool environment - now need to translate this into land based mobility.       OT Occupational Profile and History Detailed Assessment- Review of Records and additional review of physical, cognitive, psychosocial history related to current functional performance    Occupational performance deficits (Please refer to evaluation for details): ADL's;IADL's;Work;Leisure;Social Participation    Body Structure / Function / Physical Skills ADL;Strength;Balance;Proprioception;UE functional  use;IADL;Endurance;Vision;Mobility;Coordination;Decreased knowledge of precautions;FMC;GMC;Sensation    Cognitive Skills Attention;Safety Awareness;Memory;Perception;Problem Solve    Rehab Potential Fair   d/t duration from initial event   Clinical Decision Making Several treatment options, min-mod task modification necessary    Comorbidities Affecting Occupational Performance: May have comorbidities impacting occupational performance    Modification or Assistance to Complete Evaluation  Min-Moderate modification of tasks or assist with assess necessary to complete eval    OT Frequency Other (comment)   1x/wk   OT Duration 8 weeks      OT Treatment/Interventions Self-care/ADL training;Moist Heat;Fluidtherapy;DME and/or AE instruction;Balance training;Therapeutic activities;Aquatic Therapy;Ultrasound;Therapeutic exercise;Cognitive remediation/compensation;Visual/perceptual remediation/compensation;Functional Mobility Training;Neuromuscular education;Cryotherapy;Energy conservation;Manual Therapy;Patient/family education    Plan Aquatics to address safety, balance, visual compensation, and gross motor coordination   Consulted and Agree with Plan of Care Patient              Mariah Milling, OT 01/28/2022, 6:50 PM

## 2022-01-29 ENCOUNTER — Other Ambulatory Visit: Payer: Self-pay

## 2022-01-29 ENCOUNTER — Telehealth: Payer: Self-pay

## 2022-01-29 ENCOUNTER — Ambulatory Visit: Payer: Medicare Other | Attending: Family Medicine | Admitting: Physical Therapy

## 2022-01-29 DIAGNOSIS — R2689 Other abnormalities of gait and mobility: Secondary | ICD-10-CM | POA: Insufficient documentation

## 2022-01-29 DIAGNOSIS — R260 Ataxic gait: Secondary | ICD-10-CM | POA: Insufficient documentation

## 2022-01-29 DIAGNOSIS — I639 Cerebral infarction, unspecified: Secondary | ICD-10-CM | POA: Diagnosis not present

## 2022-01-29 DIAGNOSIS — R2681 Unsteadiness on feet: Secondary | ICD-10-CM | POA: Insufficient documentation

## 2022-01-29 DIAGNOSIS — R278 Other lack of coordination: Secondary | ICD-10-CM | POA: Diagnosis present

## 2022-01-29 DIAGNOSIS — I629 Nontraumatic intracranial hemorrhage, unspecified: Secondary | ICD-10-CM

## 2022-01-29 DIAGNOSIS — I480 Paroxysmal atrial fibrillation: Secondary | ICD-10-CM

## 2022-01-29 DIAGNOSIS — I483 Typical atrial flutter: Secondary | ICD-10-CM

## 2022-01-29 MED ORDER — SODIUM CHLORIDE 0.9 % IV SOLN: INTRAVENOUS | Status: DC

## 2022-01-29 NOTE — Telephone Encounter (Signed)
The patient and his wife wish to proceed with Watchman implant on 02/28/2022.  Scheduled him for pre-procedure visit on 02/11/2022 with Georgie Chard. They understand he will have labs, EKG, and receive instruction letter at that visit. They were grateful for call and agree with plan.

## 2022-01-29 NOTE — Therapy (Unsigned)
OUTPATIENT PHYSICAL THERAPY NEURO EVALUATION   Patient Name: Walter [name] MRN: [id number] DOB:09-26-1955, 66 y.o., male Today's Date: 01/30/2022   PCP: Clovis Riley, L. August Saucer, MD REFERRING PROVIDER: Micki Riley, MD    PT End of Session - 01/30/22 1646     Visit Number 1    Number of Visits 17    Date for PT Re-Evaluation 03/29/22    Authorization Type Medicare    Authorization Time Period 01-29-22 - 03-29-22    PT Start Time 1315    PT Stop Time 1352    PT Time Calculation (min) 37 min    Activity Tolerance Patient tolerated treatment well    Behavior During Therapy [facility] for tasks assessed/performed             Past Medical History:  Diagnosis Date   Atrial flutter (HCC)    s/p ablation   DVT (deep venous thrombosis) (HCC)    Near syncope 09/25/2013   Paroxysmal atrial fibrillation (HCC)    Stroke (HCC)    initially hemorragic (not on OAC) followed by subsequent embolic stroke   Past Surgical History:  Procedure Laterality Date   A-FLUTTER ABLATION N/A 03/09/2020   Procedure: A-FLUTTER ABLATION;  Surgeon: Hillis Range, MD;  Location: [facility] INVASIVE CV LAB;  Service: Cardiovascular;  Laterality: N/A;   CARDIOVERSION N/A 09/27/2013   Procedure: CARDIOVERSION;  Surgeon: Lewayne Bunting, MD;  Location: [facility] ENDOSCOPY;  Service: Cardiovascular;  Laterality: N/A;   IR ANGIO EXTERNAL CAROTID SEL EXT CAROTID UNI R MOD SED  09/26/2020   IR ANGIO INTRA EXTRACRAN SEL COM CAROTID INNOMINATE BILAT MOD SED  09/26/2020   IR ANGIO VERTEBRAL SEL VERTEBRAL UNI R MOD SED  09/26/2020   IR IVC FILTER PLMT / S&I /IMG GUID/MOD SED  10/08/2020   IR IVC FILTER RETRIEVAL / S&I /IMG GUID/MOD SED  09/28/2021   IR IVUS EACH ADDITIONAL NON CORONARY VESSEL  11/02/2020   IR PTA VENOUS EXCEPT DIALYSIS CIRCUIT  11/02/2020   IR RADIOLOGIST EVAL & MGMT  01/17/2021   IR RADIOLOGIST EVAL & MGMT  09/12/2021   IR THROMBECT VENO MECH MOD SED  10/27/2020   IR THROMBECT VENO MECH MOD SED  11/02/2020   IR US GUIDE VASC ACCESS  LEFT  10/27/2020   IR US GUIDE VASC ACCESS RIGHT  09/26/2020   IR US GUIDE VASC ACCESS RIGHT  11/02/2020   IR VENO/EXT/UNI RIGHT  11/02/2020   IR VENOCAVAGRAM IVC  10/27/2020   RETINAL DETACHMENT SURGERY     right knee arthroscopy     TEE WITHOUT CARDIOVERSION N/A 09/27/2013   Procedure: TRANSESOPHAGEAL ECHOCARDIOGRAM (TEE);  Surgeon: Lewayne Bunting, MD;  Location: [facility] ENDOSCOPY;  Service: Cardiovascular;  Laterality: N/A;   WRIST SURGERY     Patient Active Problem List   Diagnosis Date Noted   Hypertension 08/16/2021   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   Residual cognitive deficit as late effect of stroke 04/19/2021   Hemiparesis affecting right side as late effect of stroke (HCC) 04/19/2021   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 04/19/2021   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 (HCC) 04/19/2021   Thrombosis    Sleep disturbance    Dysphagia, post-stroke    PAF (paroxysmal atrial fibrillation) (HCC)    Acute pulmonary embolism without acute cor pulmonale (HCC)    Malnutrition of moderate degree 11/04/2020   Tracheostomy care (HCC)    Pressure injury of skin 10/23/2020   Intracranial hemorrhage (HCC)    Atrial fibrillation (HCC)  Prediabetes    Leukocytosis    Acute blood loss anemia    Hypernatremia    ICH (intracerebral hemorrhage) (HCC) 09/25/2020   Unilateral primary osteoarthritis, left knee 09/26/2016   Atrial flutter (HCC) 09/25/2013   Near syncope 09/25/2013    ONSET DATE: 09-25-20  REFERRING DIAG: I62.9 (ICD-10-CM) - h/o Intracranial hemorrhage (HCC)   THERAPY DIAG:  Other abnormalities of gait and mobility - Plan: PT plan of care cert/re-cert  Unsteadiness on feet - Plan: PT plan of care cert/re-cert  Other lack of coordination - Plan: PT plan of care cert/re-cert  Ataxic gait - Plan: PT plan of care cert/re-cert  Rationale for Evaluation and Treatment Rehabilitation  SUBJECTIVE:                                                                                                                                                                                               SUBJECTIVE STATEMENT: Pt is a 66 yr old gentleman who presents to PT eval for balance and gait training due to s/p ICH on 09-25-20:  Pt has had PT at this facility in the past year with most recent admission March - April 2023;  pt also had inpatient rehab in [place] in Feb. 2023 and then outpatient PT in [place] for 2 weeks after D/C from inpatient rehab facility.  Pt has been receiving aquatic therapy with OT for approx. 8 visits and has just been discharged from OT.  Pt reports [place] [facility] - went to Urgent Care in the area and received 7 staples in head.  No other injuries sustained.  Pt reports he is scheduled to have Watchman implant on 02-28-22 for the atrial fibrillation, and states he wants to be able to get off of the blood thinner medication.  Pt states he has decreased sensation on Rt side of body and has ataxia Lt side of body. Pt is using RW for assistance with ambulation but reports he has ordered a Psychologist, sport and exercise" which has not arrived yet.  Pt reports he uses another walker in his house - one that is heavier. Using lighter weight walker in community because it is easier to get in and out of car.  Pt is receiving vision therapy in [place].   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 No  PRECAUTIONS: Fall (HIGH fall risk)  WEIGHT BEARING RESTRICTIONS No  FALLS: Has patient fallen in last 6 months? Yes. Number of falls approx. 12 falls   LIVING  ENVIRONMENT: Lives with: lives with their spouse Lives in: House/apartment Stairs: Yes: Internal: 12 steps; can reach both;  External 8 steps - bil. But cannot reach both Has following equipment at home: Dan Humphreys - 2 wheeled, Shower bench, and Grab bars,manual wheelchair  Pt states they are building new house with 9" slope differential to enter home - is supposed to be ready in near future  PLOF: Independent prior to CVA in March 2022  PATIENT GOALS Improve gait and  balance  OBJECTIVE:   DIAGNOSTIC FINDINGS:  (after fall sustained 01-24-22) Medical Decision Making CT Head WO Contrast Final Result 1. No intracranial hemorrhage or depressed skull fractures. 2. Small bilateral regions of frontal lobe encephalomalacia without acute large territory infarction. 3. Degenerative changes in the cervical spine without acute fracture or subluxation.  COGNITION: Overall cognitive status: impulsive at times   SENSATION:  following are impaired on Rt side of body Light touch: Impaired  Hot/Cold: Impaired  Proprioception: Impaired    COORDINATION: Dysmetria noted with heel to shin;  Pt performed heel to shin 9 times with RLE in 10 secs:  7 times with LLE in 10 secs.  POSTURE: rounded shoulders and flexed trunk   LOWER EXTREMITY ROM:   WNL's bil. LE's   LOWER EXTREMITY MMT:  WFL's bil. LE's   BED MOBILITY:  Modified independent  TRANSFERS: Assistive device utilized: Environmental consultant - 2 wheeled pt needs cues for correct hand placement with use of RW with sit to/from stand transfers Sit to stand: CGA Stand to sit: CGA   GAIT: Gait pattern: ataxic Distance walked: 37' Assistive device utilized: Environmental consultant - 2 wheeled Level of assistance: SBA Comments: ataxic gait pattern - needs cues to decrease speed  Gait velocity - 38.38 secs = .85 ft/sec with RW  FUNCTIONAL TESTs:  Timed up and go (TUG): 42.25 secs with RW Pt stood unsupported for 2" with close SBA - some dyspnea occurring after  standing for this duration  PATIENT SURVEYS:  FOTO N/A due to CVA sustained 1 1/2 years ago    PATIENT EDUCATION: Education details: results of eval  Person educated: Patient Education method: Explanation Education comprehension: verbalized understanding   HOME EXERCISE PROGRAM: To be established    GOALS: Goals reviewed with patient? Yes  SHORT TERM GOALS: Target date: 03/01/2022  Pt will be able to perform HEP for balance and core stabilization with assist from family/caregiver as needed for safety. Baseline: dependent Goal status: INITIAL  2.  Sharlene Motts will be performed and LTG updated as appropriate.  Baseline: To be assessed Goal status: INITIAL  3.  Improve TUG score to </= 37 secs with RW with SBA to reduce fall risk. Baseline: 42.25 secs with RW Goal status: INITIAL  4.  Pt will negotiate steps in pool for entering and exiting with use of hand rails with CGA. Baseline: to be assessed Goal status: INITIAL   LONG TERM GOALS: Target date: 03/29/2022  Pt will perform upgraded HEP for balance and coordination exercises and also aquatic exercises with assist from family/caregiver as needed for safety. Baseline:  Goal status: INITIAL  2.  Increase Berg score by at least 4 points to reduce fall risk.  Baseline: To be assessed Goal status: INITIAL  3.  Improve TUG score to </= 32 secs  with RW with SBA to reduce fall risk. Baseline: 42.25 secs with RW Goal status: INITIAL  4.  Increase gait velocity to >/= 1.1 ft/sec with use of RW for increased gait efficiency. Baseline: 38.38 secs = .85 ft/sec Goal status: INITIAL  5.  Pt will amb. 10" nonstop in pool in 4' water depth with supervision without LOB for increased endurance/activity tolerance.  Baseline: to be assessed Goal status: INITIAL  6.  Amb. 115' with RW on flat, even surface with supervision for increased community accessibility.  Baseline:  69' with SBA to CGA  Goal status:   INITIAL  ASSESSMENT:  CLINICAL IMPRESSION: Patient is a 66 y.o. gentleman who was seen today for physical therapy evaluation and treatment for gait and balance impairments due to h/o hemorrhagic CVA sustained on 09-25-20.  Pt presents with left hemiataxia and sensory deficits on Rt side of body.  Pt is currently using a RW to ambulate short community distances and uses wheelchair for longer community distances.  Pt also has diplopia which impacts balance and mobility.  Pt is at high fall risk per TUG score of 42.25 secs with RW and has slow gait speed at .85 ft/sec with use of RW.  Pt will benefit from PT to address balance, coordination and gait deficits.   OBJECTIVE IMPAIRMENTS Abnormal gait, decreased activity tolerance, decreased balance, decreased coordination, decreased knowledge of use of DME, difficulty walking, decreased strength, impaired sensation, and impaired vision/preception.   ACTIVITY LIMITATIONS carrying, lifting, bending, standing, squatting, stairs, transfers, and dressing  PARTICIPATION LIMITATIONS: meal prep, cleaning, laundry, driving, shopping, and community activity  PERSONAL FACTORS Behavior pattern, Past/current experiences, and Time since onset of injury/illness/exacerbation are also affecting patient's functional outcome.   REHAB POTENTIAL: Fair due to chronicity of deficits & time since onset  CLINICAL DECISION MAKING: Evolving/moderate complexity  EVALUATION COMPLEXITY: Moderate  PLAN: PT FREQUENCY: 2x/week  PT DURATION: 8 weeks  PLANNED INTERVENTIONS: Therapeutic exercises, Therapeutic activity, Neuromuscular re-education, Balance training, Gait training, Patient/Family education, Self Care, Stair training, and Aquatic Therapy  PLAN FOR NEXT SESSION: do Berg; issue balance HEP   [place], Donavan Burnet, PT 01/30/2022, 6:31 PM

## 2022-01-30 ENCOUNTER — Encounter: Payer: Self-pay | Admitting: Physical Therapy

## 2022-02-04 ENCOUNTER — Ambulatory Visit: Payer: Medicare Other | Admitting: Physical Therapy

## 2022-02-04 ENCOUNTER — Encounter: Payer: Self-pay | Admitting: Physical Therapy

## 2022-02-04 DIAGNOSIS — R2689 Other abnormalities of gait and mobility: Secondary | ICD-10-CM | POA: Diagnosis not present

## 2022-02-04 DIAGNOSIS — R278 Other lack of coordination: Secondary | ICD-10-CM

## 2022-02-04 DIAGNOSIS — R2681 Unsteadiness on feet: Secondary | ICD-10-CM

## 2022-02-04 NOTE — Therapy (Signed)
OUTPATIENT PHYSICAL THERAPY NEURO TREATMENT NOTE   Patient Name: Walter [name] MRN: [id number] DOB:Apr 15, 1956, 66 y.o., male Today's Date: 02/04/2022   PCP: Alroy Dust, L. Marlou Sa, MD REFERRING PROVIDER: Garvin Fila, MD    PT End of Session - 02/04/22 2000     Visit Number 2    Number of Visits 17    Date for PT Re-Evaluation 03/29/22    Authorization Type Medicare    Authorization Time Period 01-29-22 - 03-29-22    PT Start Time 1445    PT Stop Time 1530    PT Time Calculation (min) 45 min    Equipment Utilized During Treatment Other (comment)   aquatic step, pool noodle   Activity Tolerance Patient tolerated treatment well    Behavior During Therapy [facility] for tasks assessed/performed             Past Medical History:  Diagnosis Date   Atrial flutter ([place])    s/p ablation   DVT (deep venous thrombosis) (HCC)    Near syncope 09/25/2013   Paroxysmal atrial fibrillation (HCC)    Stroke [facility])    initially hemorragic (not on [place]) followed by subsequent embolic stroke   Past Surgical History:  Procedure Laterality Date   A-FLUTTER ABLATION N/A 03/09/2020   Procedure: A-FLUTTER ABLATION;  Surgeon: Thompson Grayer, MD;  Location: [place] CV LAB;  Service: Cardiovascular;  Laterality: N/A;   CARDIOVERSION N/A 09/27/2013   Procedure: CARDIOVERSION;  Surgeon: Lelon Perla, MD;  Location: [facility] ENDOSCOPY;  Service: Cardiovascular;  Laterality: N/A;   IR ANGIO EXTERNAL CAROTID SEL EXT CAROTID UNI R MOD SED  09/26/2020   IR ANGIO INTRA EXTRACRAN SEL COM CAROTID INNOMINATE BILAT MOD SED  09/26/2020   IR ANGIO VERTEBRAL SEL VERTEBRAL UNI R MOD SED  09/26/2020   IR IVC FILTER PLMT / S&I /IMG GUID/MOD SED  10/08/2020   IR IVC FILTER RETRIEVAL / S&I /IMG GUID/MOD SED  09/28/2021   IR IVUS EACH ADDITIONAL NON CORONARY VESSEL  11/02/2020   IR PTA VENOUS EXCEPT DIALYSIS CIRCUIT  11/02/2020   IR RADIOLOGIST EVAL & MGMT  01/17/2021   IR RADIOLOGIST EVAL & MGMT  09/12/2021   IR THROMBECT VENO MECH  MOD SED  10/27/2020   IR THROMBECT VENO MECH MOD SED  11/02/2020   IR US GUIDE VASC ACCESS LEFT  10/27/2020   IR US GUIDE VASC ACCESS RIGHT  09/26/2020   IR US GUIDE VASC ACCESS RIGHT  11/02/2020   IR VENO/EXT/UNI RIGHT  11/02/2020   IR VENOCAVAGRAM IVC  10/27/2020   RETINAL DETACHMENT SURGERY     right knee arthroscopy     TEE WITHOUT CARDIOVERSION N/A 09/27/2013   Procedure: TRANSESOPHAGEAL ECHOCARDIOGRAM (TEE);  Surgeon: Lelon Perla, MD;  Location: [facility] ENDOSCOPY;  Service: Cardiovascular;  Laterality: N/A;   WRIST SURGERY     Patient Active Problem List   Diagnosis Date Noted   Hypertension 08/16/2021   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   Residual cognitive deficit as late effect of stroke 04/19/2021   Hemiparesis affecting right side as late effect of stroke ([place]) 04/19/2021   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 04/19/2021   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 ([place]) 04/19/2021   Thrombosis    Sleep disturbance    Dysphagia, post-stroke    PAF (paroxysmal atrial fibrillation) ([place])    Acute pulmonary embolism without acute cor pulmonale (HCC)    Malnutrition of moderate degree 11/04/2020   Tracheostomy care ([place])    Pressure  injury of skin 10/23/2020   Intracranial hemorrhage (HCC)    Atrial fibrillation (HCC)    Prediabetes    Leukocytosis    Acute blood loss anemia    Hypernatremia    ICH (intracerebral hemorrhage) ([place]) 09/25/2020   Unilateral primary osteoarthritis, left knee 09/26/2016   Atrial flutter ([place]) 09/25/2013   Near syncope 09/25/2013    ONSET DATE: 09-25-20  REFERRING DIAG: I62.9 (ICD-10-CM) - h/o Intracranial hemorrhage (HCC)   THERAPY DIAG:  Unsteadiness on feet  Other abnormalities of gait and mobility  Other lack of coordination  Rationale for Evaluation and Treatment Rehabilitation  SUBJECTIVE:                                                                                                                                                                                               SUBJECTIVE STATEMENT: Pt presents for aquatic therapy at [place] - reports no changes since eval last week; pt arrives in transport wheelchair - accompanied by his wife  Pt accompanied by:  wife  PERTINENT HISTORY: Atrial flutter (HCC)s/p ablation DVT (deep venous thrombosis) (HCC) Near syncope03/28/2015 Paroxysmal atrial fibrillation (HCC) Stroke (HCC)initially hemorragic (not on [place]) followed by subsequent embolic stroke; has Lt hemiataxia and sensory deficits on Rt side of body; also 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 No  PRECAUTIONS: Fall (HIGH fall risk)  WEIGHT BEARING RESTRICTIONS No  FALLS: Has patient fallen in last 6 months? Yes. Number of falls approx. 12 falls   LIVING ENVIRONMENT: Lives with: lives with their spouse Lives in: House/apartment Stairs: Yes: Internal: 12 steps; can reach both;  External 8 steps - bil. But cannot reach both Has following equipment at home: Gilford Rile - 2 wheeled, Shower bench, and Grab bars,manual wheelchair  Pt states they are building new house with 9" slope differential to enter home - is supposed to be ready in near future  PLOF: Independent prior to CVA in March 2022  PATIENT GOALS Improve gait and  balance   OBJECTIVE:  Patient seen for aquatic therapy today.  Treatment took place in water 3.5-4.8 feet deep depending upon activity.  Pt entered and exited the the pool via step negotiation with use of bilateral hand rails with CGA using step by step sequence.  Pt performed water walking for warm up - forwards, backwards and sideways 18' x 4 reps each direction across width of pool; no UE support used Pt performed marching in place with cues to perform slowly and to tighten core for improved stabilization - 10 reps each leg;    Stepping strategy  for balance - with CGA to min assist for recovery of LOB; forwards and sideways 10 reps each  LE  2nd set of marching in place - 10 reps each lower extremity after performing stepping strategy  Tall kneeling using bench in pool area - 3.6' water depth - pt kneeled on pool floor and used bench for UE support; performed hip abduction 10 reps each leg with assist from PT to stabilize other leg to decrease flotation; pt then kneeled on aquatic step on pool floor with use of bench for UE support - pt performed 10 reps small squats back toward heels for hip strengthening;  tall kneeling position - attempting to maintain position without UE support - pt performed UE flexion unilaterally 5 reps each UE, then bil. UE's together 5 reps;  tall kneeling - trunk rotation 3 reps to each side with CGA to min assist for balance   Sit to stand 5 reps with use of large yellow noodle for UE support  Performed water walking at end of session with cues to stand erect and ambulate slowly for increased control - 18' x 2 reps forward, backward and sideways   Pt requires buoyancy of water for support for reduced fall risk with balance exercises in aquatic environment;  exercises able to be performed safely in water compared to that on land with higher fall risk;  current of water provides perturbations for challenges with static & dynamic standing balace.  Pt also requires viscosity of water for strengthening bil. lower extremities and trunk musculature for improved core stabilization.     GOALS: Goals reviewed with patient? Yes  SHORT TERM GOALS: Target date: 03/01/2022  Pt will be able to perform HEP for balance and core stabilization with assist from family/caregiver as needed for safety. Baseline: dependent Goal status: INITIAL  2.  Sharlene Motts will be performed and LTG updated as appropriate.  Baseline: To be assessed Goal status: INITIAL  3.  Improve TUG score to </= 37 secs with RW with SBA to reduce fall risk. Baseline: 42.25 secs with RW Goal status: INITIAL  4.  Pt will negotiate steps in pool for  entering and exiting with use of hand rails with CGA. Baseline: to be assessed Goal status: INITIAL   LONG TERM GOALS: Target date: 03/29/2022  Pt will perform upgraded HEP for balance and coordination exercises and also aquatic exercises with assist from family/caregiver as needed for safety. Baseline:  Goal status: INITIAL  2.  Increase Berg score by at least 4 points to reduce fall risk.  Baseline: To be assessed Goal status: INITIAL  3.  Improve TUG score to </= 32 secs with RW with SBA to reduce fall risk. Baseline: 42.25 secs with RW Goal status: INITIAL  4.  Increase gait velocity to >/= 1.1 ft/sec with use of RW for increased gait efficiency. Baseline: 38.38 secs = .85 ft/sec Goal status: INITIAL  5.  Pt will amb. 10" nonstop in pool in 4' water depth with supervision without LOB for increased endurance/activity tolerance.  Baseline: to be assessed Goal status: INITIAL  6.  Amb. 115' with RW on flat, even surface with supervision for increased community accessibility.  Baseline:  91' with SBA to CGA  Goal status:  INITIAL  ASSESSMENT:  CLINICAL IMPRESSION: Aquatic therapy session focused on water walking in various directions for improved balance and also on core stabilization exercises performed in tall kneeling position on aquatic step on pool floor in 3.6' water depth.  Pt tolerated exercises  well with approx. 5 occurrences of moderate LOB requiring mod to min assist for recovery of LOB.  Pt able to perform exercises slowly which enabled increased control and coordination and less LOB with SLS exercises.  Cont with POC.    OBJECTIVE IMPAIRMENTS Abnormal gait, decreased activity tolerance, decreased balance, decreased coordination, decreased knowledge of use of DME, difficulty walking, decreased strength, impaired sensation, and impaired vision/preception.   ACTIVITY LIMITATIONS carrying, lifting, bending, standing, squatting, stairs, transfers, and  dressing  PARTICIPATION LIMITATIONS: meal prep, cleaning, laundry, driving, shopping, and community activity  PERSONAL FACTORS Behavior pattern, Past/current experiences, and Time since onset of injury/illness/exacerbation are also affecting patient's functional outcome.   REHAB POTENTIAL: Fair due to chronicity of deficits & time since onset  CLINICAL DECISION MAKING: Evolving/moderate complexity  EVALUATION COMPLEXITY: Moderate  PLAN: PT FREQUENCY: 2x/week  PT DURATION: 8 weeks  PLANNED INTERVENTIONS: Therapeutic exercises, Therapeutic activity, Neuromuscular re-education, Balance training, Gait training, Patient/Family education, Self Care, Stair training, and Aquatic Therapy  PLAN FOR NEXT SESSION: do Berg; issue balance HEP   Kynlei [place], Donavan Burnet, PT 02/04/2022, 8:04 PM

## 2022-02-07 ENCOUNTER — Ambulatory Visit: Payer: Medicare Other | Admitting: Physical Therapy

## 2022-02-07 DIAGNOSIS — R2681 Unsteadiness on feet: Secondary | ICD-10-CM

## 2022-02-07 DIAGNOSIS — R2689 Other abnormalities of gait and mobility: Secondary | ICD-10-CM | POA: Diagnosis not present

## 2022-02-07 NOTE — Therapy (Signed)
OUTPATIENT PHYSICAL THERAPY NEURO EVALUATION   Patient Name: Walter [name] MRN: [id number] DOB:May 26, 1956, 66 y.o., male Today's Date: 02/08/2022   PCP: Clovis Riley, L. August Saucer, MD REFERRING PROVIDER: Micki Riley, MD    PT End of Session - 02/08/22 1513     Visit Number 3    Number of Visits 17    Date for PT Re-Evaluation 03/29/22    Authorization Type Medicare    Authorization Time Period 01-29-22 - 03-29-22    PT Start Time 0850    PT Stop Time 0934    PT Time Calculation (min) 44 min    Equipment Utilized During Treatment --   aquatic step, pool noodle   Activity Tolerance Patient tolerated treatment well    Behavior During Therapy WFL for tasks assessed/performed              Past Medical History:  Diagnosis Date   Atrial flutter (HCC)    s/p ablation   DVT (deep venous thrombosis) (HCC)    Near syncope 09/25/2013   Paroxysmal atrial fibrillation (HCC)    Stroke [facility])    initially hemorragic (not on OAC) followed by subsequent embolic stroke   Past Surgical History:  Procedure Laterality Date   A-FLUTTER ABLATION N/A 03/09/2020   Procedure: A-FLUTTER ABLATION;  Surgeon: Hillis Range, MD;  Location: [facility] INVASIVE CV LAB;  Service: Cardiovascular;  Laterality: N/A;   CARDIOVERSION N/A 09/27/2013   Procedure: CARDIOVERSION;  Surgeon: Lewayne Bunting, MD;  Location: [facility] ENDOSCOPY;  Service: Cardiovascular;  Laterality: N/A;   IR ANGIO EXTERNAL CAROTID SEL EXT CAROTID UNI R MOD SED  09/26/2020   IR ANGIO INTRA EXTRACRAN SEL COM CAROTID INNOMINATE BILAT MOD SED  09/26/2020   IR ANGIO VERTEBRAL SEL VERTEBRAL UNI R MOD SED  09/26/2020   IR IVC FILTER PLMT / S&I /IMG GUID/MOD SED  10/08/2020   IR IVC FILTER RETRIEVAL / S&I /IMG GUID/MOD SED  09/28/2021   IR IVUS EACH ADDITIONAL NON CORONARY VESSEL  11/02/2020   IR PTA VENOUS EXCEPT DIALYSIS CIRCUIT  11/02/2020   IR RADIOLOGIST EVAL & MGMT  01/17/2021   IR RADIOLOGIST EVAL & MGMT  09/12/2021   IR THROMBECT VENO MECH MOD SED   10/27/2020   IR THROMBECT VENO MECH MOD SED  11/02/2020   IR US GUIDE VASC ACCESS LEFT  10/27/2020   IR US GUIDE VASC ACCESS RIGHT  09/26/2020   IR US GUIDE VASC ACCESS RIGHT  11/02/2020   IR VENO/EXT/UNI RIGHT  11/02/2020   IR VENOCAVAGRAM IVC  10/27/2020   RETINAL DETACHMENT SURGERY     right knee arthroscopy     TEE WITHOUT CARDIOVERSION N/A 09/27/2013   Procedure: TRANSESOPHAGEAL ECHOCARDIOGRAM (TEE);  Surgeon: Lewayne Bunting, MD;  Location: [facility] ENDOSCOPY;  Service: Cardiovascular;  Laterality: N/A;   WRIST SURGERY     Patient Active Problem List   Diagnosis Date Noted   Hypertension 08/16/2021   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   Residual cognitive deficit as late effect of stroke 04/19/2021   Hemiparesis affecting right side as late effect of stroke (HCC) 04/19/2021   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 04/19/2021   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 (HCC) 04/19/2021   Thrombosis    Sleep disturbance    Dysphagia, post-stroke    PAF (paroxysmal atrial fibrillation) (HCC)    Acute pulmonary embolism without acute cor pulmonale (HCC)    Malnutrition of moderate degree 11/04/2020   Tracheostomy care (HCC)    Pressure injury  of skin 10/23/2020   Intracranial hemorrhage (HCC)    Atrial fibrillation (HCC)    Prediabetes    Leukocytosis    Acute blood loss anemia    Hypernatremia    ICH (intracerebral hemorrhage) (HCC) 09/25/2020   Unilateral primary osteoarthritis, left knee 09/26/2016   Atrial flutter (HCC) 09/25/2013   Near syncope 09/25/2013    ONSET DATE: 09-25-20  REFERRING DIAG: I62.9 (ICD-10-CM) - h/o Intracranial hemorrhage (HCC)   THERAPY DIAG:  Unsteadiness on feet  Other abnormalities of gait and mobility  Rationale for Evaluation and Treatment Rehabilitation  SUBJECTIVE:                                                                                                                                                                                               SUBJECTIVE STATEMENT: Pt presents to PT accompanied by friend, Tresa Endo - reports no changes or problems since pool session on Monday; states he has adapted yoga session this afternoon  Pt accompanied by: friend, Tresa Endo  PERTINENT HISTORY: Atrial flutter (HCC)s/p ablation DVT (deep venous thrombosis) (HCC) Near syncope03/28/2015 Paroxysmal atrial fibrillation (HCC) Stroke (HCC)initially hemorragic (not on OAC) followed by subsequent embolic stroke; has Lt hemiataxia and sensory deficits on Rt side of body; also 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 No  PRECAUTIONS: Fall (HIGH fall risk)  WEIGHT BEARING RESTRICTIONS No  FALLS: Has patient fallen in last 6 months? Yes. Number of falls approx. 12 falls   LIVING ENVIRONMENT: Lives with: lives with their spouse Lives in: House/apartment Stairs: Yes: Internal: 12 steps; can reach both;  External 8 steps - bil. But cannot reach both Has following equipment at home: Dan Humphreys - 2 wheeled, Shower bench, and Grab bars,manual wheelchair  Pt states they are building new house with 9" slope differential to enter home - is supposed to be ready in near future  PLOF: Independent prior to CVA in March 2022  PATIENT GOALS Improve gait and  balance  OBJECTIVE:      GAIT: Gait pattern: ataxic Distance walked: approx. 100' from lobby to mat table on far side of gym - with SBA Assistive device utilized: Environmental consultant - 2 wheeled Level of assistance: SBA Comments: ataxic gait pattern - needs cues to decrease speed & to stand erect   NeuroRe-ed: Pt performed 5 reps sit to stand transfers without use of UE's with RW placed in front of patient; pt able to stabilize independently without bracing back of legs against mat table; pt did have wide BOS with all 5 repetitions   Standing balance exercises performed outside //  bars with pt holding bar with bil. UE's; pt performed forward kicks, side kicks and  backward kicks 10 reps each leg alternating legs;  marching in place 10 reps in place with bil. UE support  Pt performed stepping strategy exercise - stepping forward/back, stepping out/in and then stepping back/forward holding onto back of chair 5 reps each leg with bil. UE support with CGA    Pt stood static with feel approx. 4" apart 30 sec hold x 1 rep without UE support with SBA  HOME EXERCISE PROGRAM: Access Code: [email or web address] URL: [email or web address] Date: 02/08/2022 Prepared by: Maebelle Munroe  Exercises - Standing Hip Flexion with Counter Support  - 1 x daily - 7 x weekly - 1 sets - 10 reps - Standing Hip Abduction with Counter Support  - 1 x daily - 7 x weekly - 1 sets - 10 reps - Standing Hip Extension with Counter Support  - 1 x daily - 7 x weekly - 1 sets - 10 reps - Standing March with Counter Support  - 1 x daily - 7 x weekly - 1 sets - 10 reps - Corner Balance Feet Together With Eyes Open  - 1 x daily - 7 x weekly - 1 sets - 1-2 reps - 30 sec hold  PATIENT EDUCATION: Education details: Googled parallel bars for pt as he states he would like to have some placed in his fitness room which is being built in  new house currently under construction:  [employer] [phone] Person educated: Patient Education method: Explanation, demonstration and handout Education comprehension: verbalized understanding, demonstrated understanding   HOME EXERCISE PROGRAM: [employer] [email or web address]    GOALS: Goals reviewed with patient? Yes  SHORT TERM GOALS: Target date: 03/01/2022  Pt will be able to perform HEP for balance and core stabilization with assist from family/caregiver as needed for safety. Baseline: dependent Goal status: INITIAL  2.  Sharlene Motts will be performed and LTG updated as appropriate.  Baseline: To be assessed Goal status: INITIAL  3.  Improve TUG score to </= 37 secs with RW with SBA to reduce fall risk. Baseline: 42.25 secs with RW Goal status: INITIAL  4.  Pt  will negotiate steps in pool for entering and exiting with use of hand rails with CGA. Baseline: to be assessed Goal status: INITIAL   LONG TERM GOALS: Target date: 03/29/2022  Pt will perform upgraded HEP for balance and coordination exercises and also aquatic exercises with assist from family/caregiver as needed for safety. Baseline:  Goal status: INITIAL  2.  Increase Berg score by at least 4 points to reduce fall risk.  Baseline: To be assessed Goal status: INITIAL  3.  Improve TUG score to </= 32 secs with RW with SBA to reduce fall risk. Baseline: 42.25 secs with RW Goal status: INITIAL  4.  Increase gait velocity to >/= 1.1 ft/sec with use of RW for increased gait efficiency. Baseline: 38.38 secs = .85 ft/sec Goal status: INITIAL  5.  Pt will amb. 10" nonstop in pool in 4' water depth with supervision without LOB for increased endurance/activity tolerance.  Baseline: to be assessed Goal status: INITIAL  6.  Amb. 115' with RW on flat, even surface with supervision for increased community accessibility.  Baseline:  81' with SBA to CGA  Goal status:  INITIAL  ASSESSMENT:  CLINICAL IMPRESSION: PT session focused on establishing balance HEP with focus on basic balance skills with weight shift incorporated.  Pt needs UE support for safety with standing balance exercises  due to inability to perform SLS on either leg without UE support.  Pt demonstrated understanding of balance exercises and verbalized understanding of safety concern with performing HEP independently at home as supervision was strongly recommended & encouraged for safety and to reduce fall risk.  Cont with POC.  OBJECTIVE IMPAIRMENTS Abnormal gait, decreased activity tolerance, decreased balance, decreased coordination, decreased knowledge of use of DME, difficulty walking, decreased strength, impaired sensation, and impaired vision/preception.   ACTIVITY LIMITATIONS carrying, lifting, bending, standing,  squatting, stairs, transfers, and dressing  PARTICIPATION LIMITATIONS: meal prep, cleaning, laundry, driving, shopping, and community activity  PERSONAL FACTORS Behavior pattern, Past/current experiences, and Time since onset of injury/illness/exacerbation are also affecting patient's functional outcome.   REHAB POTENTIAL: Fair due to chronicity of deficits & time since onset  CLINICAL DECISION MAKING: Evolving/moderate complexity  EVALUATION COMPLEXITY: Moderate  PLAN: PT FREQUENCY: 2x/week  PT DURATION: 8 weeks  PLANNED INTERVENTIONS: Therapeutic exercises, Therapeutic activity, Neuromuscular re-education, Balance training, Gait training, Patient/Family education, Self Care, Stair training, and Aquatic Therapy  PLAN FOR NEXT SESSION: do Berg;  check balance HEP   Cullen Lahaie, Donavan Burnet, PT 02/08/2022, 3:16 PM

## 2022-02-07 NOTE — Progress Notes (Signed)
[facility]                                     Cardiology Office Note:    Date:  02/11/2022   ID:  Walter [name], DOB March 12, 1956, MRN [id number]  PCP:  Asencion Gowda.August Saucer, MD  [facility] Cardiologist:  None  [facility] Electrophysiologist:  Lanier Prude, MD   Referring MD: Clovis Riley, Elbert Ewings.August Saucer, MD   Chief Complaint  Patient presents with   Follow-up    Pre Watchman    History of Present Illness:    Walter [name] is a 66 y.o. male with a hx of paroxysmal atrial fibrillation, atrial flutter s/p ablation, DVT, and spontaneous intracranial hemorrhage 08/2020 (not on anticoagulation) and is now s/p cardioembolic CVA.   Walter [name] was diagnosed with AF 08/2020. He presented to [facility] 09/25/20 after awaking from sleep with slurred speech and HA. On ED arrival he was intubated due to lethargy. Emergent head CT was performed which showed a left midbrain hemorrhage involving the dorsal part of the pons as well with extension into the cerebral aqueduct and fourth ventricle with local mass-effect on the brainstem but no hydrocephalus. CT angiogram of the head and neck showed no large vessel occlusion or AV malformation. He was started on hypertonic saline and had repeat CT scan which showed worsening hydrocephalus and neurosurgery was consulted who placed a right frontal ventriculostomy catheter. MRI scan of the brain was obtained which confirmed acute hemorrhage within the left dorsal midbrain and pons extending to inferior medial thalamus with intraventricular extension. After EVD placement, he showed neurological improvement. Hospital course was c/b a DVT and PE requiring anticoagulation. He also required a tracheostomy and IVC filter which has since been removed.   He was transferred to to inpatient rehab on 4/28 where he stayed 4 weeks and went home 11/23/20. In last follow up with Dr. Pearlean Brownie 09/2021, he was felt to be a good candidate for Watchman implant. He was referred for LAAO  by Dr. Ladona Ridgel.   Today he is here with his wife and reports he has been doing well. He has no questions about LAAO scheduled for 8/31 other than whether he would be considered a same day discharge or not. He has no new neuro symptoms. No chest pain, palpitations, SOB, LE edema, dizziness, HA, orthopnea, or syncope. Tolerating Eliquis ok without bleeding in stool or urine.   Past Medical History:  Diagnosis Date   Atrial flutter [facility])    s/p ablation   DVT (deep venous thrombosis) (HCC)    Near syncope 09/25/2013   Paroxysmal atrial fibrillation (HCC)    Stroke [facility])    initially hemorragic (not on OAC) followed by subsequent embolic stroke    Past Surgical History:  Procedure Laterality Date   A-FLUTTER ABLATION N/A 03/09/2020   Procedure: A-FLUTTER ABLATION;  Surgeon: Hillis Range, MD;  Location: [facility] INVASIVE CV LAB;  Service: Cardiovascular;  Laterality: N/A;   CARDIOVERSION N/A 09/27/2013   Procedure: CARDIOVERSION;  Surgeon: Lewayne Bunting, MD;  Location: [facility] ENDOSCOPY;  Service: Cardiovascular;  Laterality: N/A;   IR ANGIO EXTERNAL CAROTID SEL EXT CAROTID UNI R MOD SED  09/26/2020   IR ANGIO INTRA EXTRACRAN SEL COM CAROTID INNOMINATE BILAT MOD SED  09/26/2020   IR ANGIO VERTEBRAL SEL VERTEBRAL UNI R MOD SED  09/26/2020   IR IVC FILTER PLMT / S&I Lenise Arena GUID/MOD SED  10/08/2020   IR IVC FILTER RETRIEVAL / S&I /IMG GUID/MOD SED  09/28/2021   IR IVUS EACH ADDITIONAL NON CORONARY VESSEL  11/02/2020   IR PTA VENOUS EXCEPT DIALYSIS CIRCUIT  11/02/2020   IR RADIOLOGIST EVAL & MGMT  01/17/2021   IR RADIOLOGIST EVAL & MGMT  09/12/2021   IR THROMBECT VENO MECH MOD SED  10/27/2020   IR THROMBECT VENO MECH MOD SED  11/02/2020   IR US GUIDE VASC ACCESS LEFT  10/27/2020   IR US GUIDE VASC ACCESS RIGHT  09/26/2020   IR US GUIDE VASC ACCESS RIGHT  11/02/2020   IR VENO/EXT/UNI RIGHT  11/02/2020   IR VENOCAVAGRAM IVC  10/27/2020   RETINAL DETACHMENT SURGERY     right knee arthroscopy     TEE WITHOUT CARDIOVERSION  N/A 09/27/2013   Procedure: TRANSESOPHAGEAL ECHOCARDIOGRAM (TEE);  Surgeon: Lewayne Bunting, MD;  Location: [facility] ENDOSCOPY;  Service: Cardiovascular;  Laterality: N/A;   WRIST SURGERY      Current Medications: Current Meds  Medication Sig   apixaban (ELIQUIS) 5 MG TABS tablet Take 1 tablet (5 mg total) by mouth 2 (two) times daily.   atorvastatin (LIPITOR) 20 MG tablet Take 1 tablet (20 mg total) by mouth at bedtime.   diltiazem (CARDIZEM CD) 120 MG 24 hr capsule Take 1 capsule (120 mg total) by mouth daily.   EPINEPHrine 0.3 mg/0.3 mL IJ SOAJ injection Inject 0.3 mg into the muscle as needed for anaphylaxis.   mirtazapine (REMERON) 7.5 MG tablet TAKE 2 TABLETS BY MOUTH AT BEDTIME   pregabalin (LYRICA) 50 MG capsule Take 1 capsule (50 mg total) by mouth 2 (two) times daily.   sertraline (ZOLOFT) 100 MG tablet Take 100 mg by mouth daily.   Current Facility-Administered Medications for the 02/11/22 encounter (Office Visit) with [facility]  Medication   0.9 %  sodium chloride infusion   0.9 %  sodium chloride infusion     Allergies:   Keppra [levetiracetam] and Latex   Social History   Socioeconomic History   Marital status: Married    Spouse name: cathy   Number of children: Not on file   Years of education: Not on file   Highest education level: Not on file  Occupational History   Not on file  Tobacco Use   Smoking status: Never   Smokeless tobacco: Never  Vaping Use   Vaping Use: Never used  Substance and Sexual Activity   Alcohol use: Yes    Comment: occasionally   Drug use: No   Sexual activity: Not on file  Other Topics Concern   Not on file  Social History Narrative   Lives with wife at home   Right Handed   Drinks decaf   Social Determinants of Health   Financial Resource Strain: Not on file  Food Insecurity: Not on file  Transportation Needs: Not on file  Physical Activity: Not on file  Stress: Not on file  Social Connections: Not on  file     Family History: The patient's family history includes Healthy in his brother and sister; Heart disease in his father and mother.  ROS:   Please see the history of present illness.    All other systems reviewed and are negative.  EKGs/Labs/Other Studies Reviewed:    The following studies were reviewed today:  CTA 01/16/22:  FINDINGS: Mild bi atrial enlargement No ASD/PFO No pericardial effusion. Normal ascending thoracic aorta 3.6 cm   Wind sock type appendage. Landing  Zone 21.1 mm with depth 30.2 mm Suitable for a 27 mm FLX device   Working Angle RAO 16 Caudal 13 degrees   Mid inferior trans septal puncture with double curve catheter   LUPV:  Ostium 14.5 mm   area 1.9 cm2   LLPV:   Ostium 18.8 mm  area 1.5 cm2  Elliptical   RUPV:  Ostium 16.1 mm  area 1.8 cm2   RLPV:  Ostium 18 mm  area 2.1 cm2   IMPRESSION: 1.  Mild bi atrial enlargement   2.  No ASD/PFO   3. Wind sock type appendage Landing Zone 21.1 mm with depth 30.2 mm Suitable for a 27 mm FLX device   4.  No LAA thrombus   5.  Normal PV anatomy see measurements above   6.  No pericardial effusion   7.  Normal ascending thoracic aorta 3.6 cm   8.  Coronary calcium score 0  EKG:  EKG is ordered today.  The ekg ordered today demonstrates NSR with HR 87bpm.   Recent Labs: 08/15/2021: Pro B Natriuretic peptide (BNP) 32.0; TSH 1.92 09/28/2021: Hemoglobin 15.3; Platelets 216 12/26/2021: BUN 12; Creatinine, Ser 1.02; Potassium 4.7; Sodium 142   Recent Lipid Panel    Component Value Date/Time   CHOL 155 09/25/2020 1659   TRIG 117 09/25/2020 1659   HDL 44 09/25/2020 1659   CHOLHDL 3.5 09/25/2020 1659   VLDL 23 09/25/2020 1659   LDLCALC 88 09/25/2020 1659   Risk Assessment/Calculations:    HAS-BLED score 4 Hypertension Yes  Abnormal renal and liver function (Dialysis, transplant, Cr >2.26 mg/dL /Cirrhosis or Bilirubin >2x Normal or AST/ALT/AP >3x Normal) No  Stroke Yes  Bleeding Yes  Labile  INR (Unstable/high INR) No  Elderly (>65) Yes  Drugs or alcohol (? 8 drinks/week, anti-plt or NSAID) No    CHA2DS2-VASc Score = 4  The patient's score is based upon: CHF History: 0 HTN History: 1 Diabetes History: 0 Stroke History: 2 Vascular Disease History: 0 Age Score: 1 Gender Score: 0  Physical Exam:    VS:  BP 130/84   Pulse 87   Ht 6' (1.829 m)   Wt 226 lb 6.4 oz (102.7 kg)   SpO2 94%   BMI 30.71 kg/m     Wt Readings from Last 3 Encounters:  02/11/22 226 lb 6.4 oz (102.7 kg)  12/17/21 231 lb (104.8 kg)  10/19/21 231 lb (104.8 kg)    General: Well developed, well nourished, NAD Neck: Negative for carotid bruits. No JVD Lungs:Clear to ausculation bilaterally. Breathing is unlabored. Cardiovascular: RRR with S1 S2. No murmurs Extremities: No edema. Neuro: Alert and oriented. No focal deficits. No facial asymmetry. MAE spontaneously. Psych: Responds to questions appropriately with normal affect.    ASSESSMENT/PLAN:    PAF: Referred to Dr. Lalla Brothers after spontaneous ICH not on anticoagulation with atrial fibrillation and hx of cardio embolic CVA with significant residual. Plan for LAAO closure 8/31. All questions answered and pre procedure instructions reviewed with the patient and his wife. Obtain BMET, CBC today.   HTN: Stable at 130/84 with no changes needed today  Ischemic hemorraghic stroke: No new neuro changes. Follows closely with Dr. Pearlean Brownie. Plan for Watchman implant 8/31 with plans to be off AC after 6 months.   Medication Adjustments/Labs and Tests Ordered: Current medicines are reviewed at length with the patient today.  Concerns regarding medicines are outlined above.  Orders Placed This Encounter  Procedures   CBC   Basic metabolic  panel   EKG 12-Lead   No orders of the defined types were placed in this encounter.   Patient Instructions  Medication Instructions:  Your physician recommends that you continue on your current medications as  directed. Please refer to the Current Medication list given to you today.  *If you need a refill on your cardiac medications before your next appointment, please call your pharmacy*   Lab Work: TODAY: BMET, CBC If you have labs (blood work) drawn today and your tests are completely normal, you will receive your results only by: MyChart Message (if you have MyChart) OR A paper copy in the mail If you have any lab test that is abnormal or we need to change your treatment, we will call you to review the results.   Testing/Procedures: SEE INSTRUCTION LETTER   Follow-Up: At [facility], you and your health needs are our priority.  As part of our continuing mission to provide you with exceptional heart care, we have created designated Provider Care Teams.  These Care Teams include your primary Cardiologist (physician) and Advanced Practice Providers (APPs -  Physician Assistants and Nurse Practitioners) who all work together to provide you with the care you need, when you need it.  We recommend signing up for the patient portal called "MyChart".  Sign up information is provided on this After Visit Summary.  MyChart is used to connect with patients for Virtual Visits (Telemedicine).  Patients are able to view lab/test results, encounter notes, upcoming appointments, etc.  Non-urgent messages can be sent to your provider as well.   To learn more about what you can do with MyChart, go to [email or web address].    Your next appointment:   KEEP SCHEDULED APPOINTMENT   Important Information About Sugar         Signed, Georgie Chard, NP  02/11/2022 3:12 PM    [facility]

## 2022-02-08 ENCOUNTER — Encounter: Payer: Self-pay | Admitting: Physical Therapy

## 2022-02-11 ENCOUNTER — Encounter: Payer: Self-pay | Admitting: Cardiology

## 2022-02-11 ENCOUNTER — Ambulatory Visit: Payer: Medicare Other | Admitting: Physical Therapy

## 2022-02-11 ENCOUNTER — Encounter: Payer: Self-pay | Admitting: Physical Therapy

## 2022-02-11 ENCOUNTER — Ambulatory Visit (INDEPENDENT_AMBULATORY_CARE_PROVIDER_SITE_OTHER): Payer: Medicare Other | Admitting: Cardiology

## 2022-02-11 VITALS — BP 130/84 | HR 87 | Ht 72.0 in | Wt 226.4 lb

## 2022-02-11 DIAGNOSIS — R2689 Other abnormalities of gait and mobility: Secondary | ICD-10-CM

## 2022-02-11 DIAGNOSIS — I629 Nontraumatic intracranial hemorrhage, unspecified: Secondary | ICD-10-CM

## 2022-02-11 DIAGNOSIS — E785 Hyperlipidemia, unspecified: Secondary | ICD-10-CM | POA: Diagnosis not present

## 2022-02-11 DIAGNOSIS — I480 Paroxysmal atrial fibrillation: Secondary | ICD-10-CM

## 2022-02-11 DIAGNOSIS — Z01818 Encounter for other preprocedural examination: Secondary | ICD-10-CM | POA: Diagnosis not present

## 2022-02-11 DIAGNOSIS — I10 Essential (primary) hypertension: Secondary | ICD-10-CM

## 2022-02-11 DIAGNOSIS — I483 Typical atrial flutter: Secondary | ICD-10-CM

## 2022-02-11 DIAGNOSIS — R278 Other lack of coordination: Secondary | ICD-10-CM

## 2022-02-11 DIAGNOSIS — R2681 Unsteadiness on feet: Secondary | ICD-10-CM

## 2022-02-11 LAB — CBC
Hematocrit: 44.6 % (ref 37.5–51.0)
Hemoglobin: 15.1 g/dL (ref 13.0–17.7)
MCH: 31.6 pg (ref 26.6–33.0)
MCHC: 33.9 g/dL (ref 31.5–35.7)
MCV: 93 fL (ref 79–97)
Platelets: 248 10*3/uL (ref 150–450)
RBC: 4.78 x10E6/uL (ref 4.14–5.80)
RDW: 14 % (ref 11.6–15.4)
WBC: 5.4 10*3/uL (ref 3.4–10.8)

## 2022-02-11 LAB — BASIC METABOLIC PANEL
BUN/Creatinine Ratio: 13 (ref 10–24)
BUN: 13 mg/dL (ref 8–27)
CO2: 28 mmol/L (ref 20–29)
Calcium: 10 mg/dL (ref 8.6–10.2)
Chloride: 102 mmol/L (ref 96–106)
Creatinine, Ser: 0.98 mg/dL (ref 0.76–1.27)
Glucose: 128 mg/dL — ABNORMAL HIGH (ref 70–99)
Potassium: 4.6 mmol/L (ref 3.5–5.2)
Sodium: 141 mmol/L (ref 134–144)
eGFR: 85 mL/min/{1.73_m2} (ref >59)

## 2022-02-11 NOTE — Therapy (Signed)
OUTPATIENT PHYSICAL THERAPY NEURO TREATMENT NOTE   Patient Name: Walter [name] MRN: [id number] DOB:February 25, 1956, 66 y.o., male Today's Date: 02/11/2022   PCP: Clovis Riley, L. August Saucer, MD REFERRING PROVIDER: Micki Riley, MD    PT End of Session - 02/11/22 1949     Visit Number 4    Number of Visits 17    Date for PT Re-Evaluation 03/29/22    Authorization Type Medicare    Authorization Time Period 01-29-22 - 03-29-22    PT Start Time 1450    PT Stop Time 1540    PT Time Calculation (min) 50 min    Equipment Utilized During Treatment Other (comment)   large pool noodle   Activity Tolerance Patient tolerated treatment well    Behavior During Therapy [facility] for tasks assessed/performed             Past Medical History:  Diagnosis Date   Atrial flutter (HCC)    s/p ablation   DVT (deep venous thrombosis) (HCC)    Near syncope 09/25/2013   Paroxysmal atrial fibrillation (HCC)    Stroke [facility])    initially hemorragic (not on OAC) followed by subsequent embolic stroke   Past Surgical History:  Procedure Laterality Date   A-FLUTTER ABLATION N/A 03/09/2020   Procedure: A-FLUTTER ABLATION;  Surgeon: Hillis Range, MD;  [facility]: [facility];  Service: Cardiovascular;  Laterality: N/A;   CARDIOVERSION N/A 09/27/2013   Procedure: CARDIOVERSION;  Surgeon: Lewayne Bunting, MD;  Location: [facility] ENDOSCOPY;  Service: Cardiovascular;  Laterality: N/A;   IR ANGIO EXTERNAL CAROTID SEL EXT CAROTID UNI R MOD SED  09/26/2020   IR ANGIO INTRA EXTRACRAN SEL COM CAROTID INNOMINATE BILAT MOD SED  09/26/2020   IR ANGIO VERTEBRAL SEL VERTEBRAL UNI R MOD SED  09/26/2020   IR IVC FILTER PLMT / S&I /IMG GUID/MOD SED  10/08/2020   IR IVC FILTER RETRIEVAL / S&I /IMG GUID/MOD SED  09/28/2021   IR IVUS EACH ADDITIONAL NON CORONARY VESSEL  11/02/2020   IR PTA VENOUS EXCEPT DIALYSIS CIRCUIT  11/02/2020   IR RADIOLOGIST EVAL & MGMT  01/17/2021   IR RADIOLOGIST EVAL & MGMT  09/12/2021   IR THROMBECT VENO MECH MOD SED   10/27/2020   IR THROMBECT VENO MECH MOD SED  11/02/2020   IR US GUIDE VASC ACCESS LEFT  10/27/2020   IR US GUIDE VASC ACCESS RIGHT  09/26/2020   IR US GUIDE VASC ACCESS RIGHT  11/02/2020   IR VENO/EXT/UNI RIGHT  11/02/2020   IR VENOCAVAGRAM IVC  10/27/2020   RETINAL DETACHMENT SURGERY     right knee arthroscopy     TEE WITHOUT CARDIOVERSION N/A 09/27/2013   Procedure: TRANSESOPHAGEAL ECHOCARDIOGRAM (TEE);  Surgeon: Lewayne Bunting, MD;  Location: [facility] ENDOSCOPY;  Service: Cardiovascular;  Laterality: N/A;   WRIST SURGERY     Patient Active Problem List   Diagnosis Date Noted   Hypertension 08/16/2021   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   Residual cognitive deficit as late effect of stroke 04/19/2021   Hemiparesis affecting right side as late effect of stroke (HCC) 04/19/2021   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 04/19/2021   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 (HCC) 04/19/2021   Thrombosis    Sleep disturbance    Dysphagia, post-stroke    PAF (paroxysmal atrial fibrillation) (HCC)    Acute pulmonary embolism without acute cor pulmonale (HCC)    Malnutrition of moderate degree 11/04/2020   Tracheostomy care (HCC)    Pressure injury  of skin 10/23/2020   Intracranial hemorrhage (HCC)    Atrial fibrillation (HCC)    Prediabetes    Leukocytosis    Acute blood loss anemia    Hypernatremia    ICH (intracerebral hemorrhage) (HCC) 09/25/2020   Unilateral primary osteoarthritis, left knee 09/26/2016   Atrial flutter (HCC) 09/25/2013   Near syncope 09/25/2013    ONSET DATE: 09-25-20  REFERRING DIAG: I62.9 (ICD-10-CM) - h/o Intracranial hemorrhage (HCC)   THERAPY DIAG:  Unsteadiness on feet  Other abnormalities of gait and mobility  Other lack of coordination  Rationale for Evaluation and Treatment Rehabilitation  SUBJECTIVE:                                                                                                                                                                                               SUBJECTIVE STATEMENT: Pt presents for aquatic therapy at [facility] - pt reports no changes or falls since previous PT session last Thursday; had lab work done earlier today for [employer] implant procedure scheduled for 02-28-22  Pt accompanied by:  wife  PERTINENT HISTORY: Atrial flutter (HCC)s/p ablation DVT (deep venous thrombosis) (HCC) Near syncope03/28/2015 Paroxysmal atrial fibrillation (HCC) Stroke (HCC)initially hemorragic (not on OAC) followed by subsequent embolic stroke; has Lt hemiataxia and sensory deficits on Rt side of body; also 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 No  PRECAUTIONS: Fall (HIGH fall risk)  WEIGHT BEARING RESTRICTIONS No  FALLS: Has patient fallen in last 6 months? Yes. Number of falls approx. 12 falls   LIVING ENVIRONMENT: Lives with: lives with their spouse Lives in: House/apartment Stairs: Yes: Internal: 12 steps; can reach both;  External 8 steps - bil. But cannot reach both Has following equipment at home: Dan Humphreys - 2 wheeled, Shower bench, and Grab bars,manual wheelchair  Pt states they are building new house with 9" slope differential to enter home - is supposed to be ready in near future  PLOF: Independent prior to CVA in March 2022  PATIENT GOALS Improve gait and  balance   OBJECTIVE:  Patient seen for aquatic therapy today.  Treatment took place in water 3.5-4.8 feet deep depending upon activity.  Pt entered and exited the the pool via step negotiation with use of bilateral hand rails with CGA to SBA using step by step sequence.  Pt performed water walking for warm up - forwards, backwards and sideways 18' x 4 reps each direction across width of pool holding onto large yellow noodle for support and assist with balance  Pt performed marching in place with cues to perform slowly  and to tighten core for improved stabilization - 10 reps each leg, holding onto  yellow noodle   Stepping strategy for balance - with CGA to min assist for recovery of LOB; forwards, sideways, and backwards 10 reps each LE - holding onto noodle for support with CGA to min assist for recovery of LOB  Pt performed jogging in 4'8" water depth with use of noodle for support and assist with balance with CGA for balance - 18' x 4 reps  1/2 jumping jacks 10 reps with CGA to min assist;  lunges 5 reps alternating with use of yellow noodle for support  Pt performed 5 jumps - bilateral LE's -holding onto noodle for support   Tall kneeling on pool floor using bench in pool area - 3.6' water depth - pt kneeled on pool floor and used bench for UE support; performed hip abduction 10 reps each leg with assist from PT to stabilize other leg to decrease flotation; - pt performed 5 reps small squats back toward heels for hip strengthening;  tall kneeling position - attempting to maintain position without UE support - pt performed UE flexion unilaterally 3 reps each UE, then bil. UE's together 3 reps;  tall kneeling   Sit to stand 5 reps from bench in pool in 3.6' water depth - no UE support used - min to mod assist on 1st rep for recovery of LOB to Rt side  Performed water walking at end of session with cues to stand erect and ambulate slowly for increased control - 18' x 2 reps forward without use of noodle for UE support    Pt requires buoyancy of water for support for reduced fall risk with balance exercises in aquatic environment;  exercises able to be performed safely in water compared to that on land with higher fall risk;  current of water provides perturbations for challenges with static & dynamic standing balace.  Pt also requires viscosity of water for strengthening bil. lower extremities and trunk musculature for improved core stabilization.     GOALS: Goals reviewed with patient? Yes  SHORT TERM GOALS: Target date: 03/01/2022  Pt will be able to perform HEP for balance and core  stabilization with assist from family/caregiver as needed for safety. Baseline: dependent Goal status: INITIAL  2.  Merrilee Jansky will be performed and LTG updated as appropriate.  Baseline: To be assessed Goal status: INITIAL  3.  Improve TUG score to </= 37 secs with RW with SBA to reduce fall risk. Baseline: 42.25 secs with RW Goal status: INITIAL  4.  Pt will negotiate steps in pool for entering and exiting with use of hand rails with CGA. Baseline: to be assessed Goal status: INITIAL   LONG TERM GOALS: Target date: 03/29/2022  Pt will perform upgraded HEP for balance and coordination exercises and also aquatic exercises with assist from family/caregiver as needed for safety. Baseline:  Goal status: INITIAL  2.  Increase Berg score by at least 4 points to reduce fall risk.  Baseline: To be assessed Goal status: INITIAL  3.  Improve TUG score to </= 32 secs with RW with SBA to reduce fall risk. Baseline: 42.25 secs with RW Goal status: INITIAL  4.  Increase gait velocity to >/= 1.1 ft/sec with use of RW for increased gait efficiency. Baseline: 38.38 secs = .85 ft/sec Goal status: INITIAL  5.  Pt will amb. 10" nonstop in pool in 4' water depth with supervision without LOB for increased endurance/activity tolerance.  Baseline:  to be assessed Goal status: INITIAL  6.  Amb. 115' with RW on flat, even surface with supervision for increased community accessibility.  Baseline:  25' with SBA to CGA  Goal status:  INITIAL  ASSESSMENT:  CLINICAL IMPRESSION: Aquatic therapy session focused on water walking in various directions and balance exercises with use of noodle for UE support and assist with balance.  Pt able to perform squat with wide BOS in 3'6" without LOB.  Pt able to perform fast walking forward with use of noodle and then progress to jogging 18' x 4 reps across width of pool with CGA without major LOB.  Cont to need cues to perform movements slow and controlled as able. Cont  with POC.    OBJECTIVE IMPAIRMENTS Abnormal gait, decreased activity tolerance, decreased balance, decreased coordination, decreased knowledge of use of DME, difficulty walking, decreased strength, impaired sensation, and impaired vision/preception.   ACTIVITY LIMITATIONS carrying, lifting, bending, standing, squatting, stairs, transfers, and dressing  PARTICIPATION LIMITATIONS: meal prep, cleaning, laundry, driving, shopping, and community activity  PERSONAL FACTORS Behavior pattern, Past/current experiences, and Time since onset of injury/illness/exacerbation are also affecting patient's functional outcome.   REHAB POTENTIAL: Fair due to chronicity of deficits & time since onset  CLINICAL DECISION MAKING: Evolving/moderate complexity  EVALUATION COMPLEXITY: Moderate  PLAN: PT FREQUENCY: 2x/week  PT DURATION: 8 weeks  PLANNED INTERVENTIONS: Therapeutic exercises, Therapeutic activity, Neuromuscular re-education, Balance training, Gait training, Patient/Family education, Self Care, Stair training, and Aquatic Therapy  PLAN FOR NEXT SESSION: do Berg; check balance HEP   Adael Culbreath, [place], PT 02/11/2022, 7:55 PM

## 2022-02-11 NOTE — Patient Instructions (Addendum)
Medication Instructions:  Your physician recommends that you continue on your current medications as directed. Please refer to the Current Medication list given to you today.  *If you need a refill on your cardiac medications before your next appointment, please call your pharmacy*   Lab Work: TODAY: BMET, CBC If you have labs (blood work) drawn today and your tests are completely normal, you will receive your results only by: MyChart Message (if you have MyChart) OR A paper copy in the mail If you have any lab test that is abnormal or we need to change your treatment, we will call you to review the results.   Testing/Procedures: SEE INSTRUCTION LETTER   Follow-Up: At [facility], you and your health needs are our priority.  As part of our continuing mission to provide you with exceptional heart care, we have created designated Provider Care Teams.  These Care Teams include your primary Cardiologist (physician) and Advanced Practice Providers (APPs -  Physician Assistants and Nurse Practitioners) who all work together to provide you with the care you need, when you need it.  We recommend signing up for the patient portal called "MyChart".  Sign up information is provided on this After Visit Summary.  MyChart is used to connect with patients for Virtual Visits (Telemedicine).  Patients are able to view lab/test results, encounter notes, upcoming appointments, etc.  Non-urgent messages can be sent to your provider as well.   To learn more about what you can do with MyChart, go to [email or web address].    Your next appointment:   KEEP SCHEDULED APPOINTMENT   Important Information About Sugar

## 2022-02-11 NOTE — Progress Notes (Unsigned)
[facility]                                     Cardiology Office Note:    Date:  02/11/2022   ID:  Walter [name], DOB Apr 30, 1956, MRN [id number]  PCP:  Walter [name].Walter Saucer, MD  [facility] Cardiologist:  None  [facility] Electrophysiologist:  Walter Prude, MD   Referring MD: No ref. provider found   No chief complaint on file. ***  History of Present Illness:    Walter [name] is a 66 y.o. male with a hx of of paroxysmal atrial fibrillation, atrial flutter s/p ablation, DVT, and intracranial hemorrhage while on anticoagulation.    Walter [name] is followed by Walter [name] for his AF care. He was seen 08/16/21 at which time the patient was restarted on Eliquis following is his acute DVT. He was undergoing extensive rehab at the [facility] in [place], [place]. He was maintaining NSR and was referred to vascular specialist at [facility]. He was felt to be a poor candidate for long term AC due to a hx of bleeding intracerebral, subdural, GI, retro-peritoneal) and HAS-BLED score of 2. Yearly major bleeding risk of 12.5%.   He is accompanied by a family member. Overall, he is not feeling well, and does not feel much improvement.    About 2 weeks ago his Eliquis was stopped, and his IVC filter was removed last week. No issues during that procedure.   He denies any palpitations, chest pain, shortness of breath, or peripheral edema. No lightheadedness, headaches, syncope, orthopnea, or PND.     PAF:        HTN:       Ischemic hemorraghic stroke:        Past Medical History:  Diagnosis Date   Atrial flutter (HCC)    s/p ablation   DVT (deep venous thrombosis) (HCC)    Near syncope 09/25/2013   Paroxysmal atrial fibrillation (HCC)    Stroke [facility])    initially hemorragic (not on OAC) followed by subsequent embolic stroke    Past Surgical History:  Procedure Laterality Date   A-FLUTTER ABLATION N/A 03/09/2020   Procedure: A-FLUTTER ABLATION;  Surgeon:  Walter Range, MD;  [facility]: [facility];  Service: Cardiovascular;  Laterality: N/A;   CARDIOVERSION N/A 09/27/2013   Procedure: CARDIOVERSION;  Surgeon: Walter Bunting, MD;  Location: [facility] ENDOSCOPY;  Service: Cardiovascular;  Laterality: N/A;   IR ANGIO EXTERNAL CAROTID SEL EXT CAROTID UNI R MOD SED  09/26/2020   IR ANGIO INTRA EXTRACRAN SEL COM CAROTID INNOMINATE BILAT MOD SED  09/26/2020   IR ANGIO VERTEBRAL SEL VERTEBRAL UNI R MOD SED  09/26/2020   IR IVC FILTER PLMT / S&I /IMG GUID/MOD SED  10/08/2020   IR IVC FILTER RETRIEVAL / S&I /IMG GUID/MOD SED  09/28/2021   IR IVUS EACH ADDITIONAL NON CORONARY VESSEL  11/02/2020   IR PTA VENOUS EXCEPT DIALYSIS CIRCUIT  11/02/2020   IR RADIOLOGIST EVAL & MGMT  01/17/2021   IR RADIOLOGIST EVAL & MGMT  09/12/2021   IR THROMBECT VENO MECH MOD SED  10/27/2020   IR THROMBECT VENO MECH MOD SED  11/02/2020   IR US GUIDE VASC ACCESS LEFT  10/27/2020   IR US GUIDE VASC ACCESS RIGHT  09/26/2020   IR US GUIDE VASC ACCESS RIGHT  11/02/2020   IR VENO/EXT/UNI RIGHT  11/02/2020  IR VENOCAVAGRAM IVC  10/27/2020   RETINAL DETACHMENT SURGERY     right knee arthroscopy     TEE WITHOUT CARDIOVERSION N/A 09/27/2013   Procedure: TRANSESOPHAGEAL ECHOCARDIOGRAM (TEE);  Surgeon: Walter Bunting, MD;  Location: [facility] ENDOSCOPY;  Service: Cardiovascular;  Laterality: N/A;   WRIST SURGERY      Current Medications: No outpatient medications have been marked as taking for the 02/11/22 encounter (Letter (Out)) with Walter Schilder, NP.   Current Facility-Administered Medications for the 02/11/22 encounter (Letter (Out)) with Walter Chard D, NP  Medication   0.9 %  sodium chloride infusion   0.9 %  sodium chloride infusion     Allergies:   Keppra [levetiracetam] and Latex   Social History   Socioeconomic History   Marital status: Married    Spouse name: cathy   Number of children: Not on file   Years of education: Not on file   Highest education level: Not on file   Occupational History   Not on file  Tobacco Use   Smoking status: Never   Smokeless tobacco: Never  Vaping Use   Vaping Use: Never used  Substance and Sexual Activity   Alcohol use: Yes    Comment: occasionally   Drug use: No   Sexual activity: Not on file  Other Topics Concern   Not on file  Social History Narrative   Lives with wife at home   Right Handed   Drinks decaf   Social Determinants of Health   Financial Resource Strain: Not on file  Food Insecurity: Not on file  Transportation Needs: Not on file  Physical Activity: Not on file  Stress: Not on file  Social Connections: Not on file     Family History: The patient's ***family history includes Healthy in his brother and sister; Heart disease in his father and mother.  ROS:   Please see the history of present illness.    All other systems reviewed and are negative.  EKGs/Labs/Other Studies Reviewed:    The following studies were reviewed today:    HAS-BLED score 4 Hypertension Yes  Abnormal renal and liver function (Dialysis, transplant, Cr >2.26 mg/dL /Cirrhosis or Bilirubin >2x Normal or AST/ALT/AP >3x Normal) No  Stroke Yes  Bleeding Yes  Labile INR (Unstable/high INR) No  Elderly (>65) Yes  Drugs or alcohol (? 8 drinks/week, anti-plt or NSAID) No    CHA2DS2-VASc Score = 4  The patient's score is based upon: CHF History: 0 HTN History: 1 Diabetes History: 0 Stroke History: 2 Vascular Disease History: 0 Age Score: 1 Gender Score: 0    EKG:  EKG is *** ordered today.  The ekg ordered today demonstrates ***  Recent Labs: 08/15/2021: Pro B Natriuretic peptide (BNP) 32.0; TSH 1.92 09/28/2021: Hemoglobin 15.3; Platelets 216 12/26/2021: BUN 12; Creatinine, Ser 1.02; Potassium 4.7; Sodium 142  Recent Lipid Panel    Component Value Date/Time   CHOL 155 09/25/2020 1659   TRIG 117 09/25/2020 1659   HDL 44 09/25/2020 1659   CHOLHDL 3.5 09/25/2020 1659   VLDL 23 09/25/2020 1659   LDLCALC 88  09/25/2020 1659     Risk Assessment/Calculations:   {Does this patient have ATRIAL FIBRILLATION?:[phone]}   CHA2DS2-VASc Score = 4 [ CHF History: 0, HTN History: 1, Diabetes History: 0, Stroke History: 2, Vascular Disease History: 0, Age Score: 1, Gender Score: 0].  Therefore, the patient's annual risk of stroke is 4.8 %.        HAS-BLED score ***  Hypertension (Uncontrolled in 30 days)  {YES/NO:21197} Abnormal renal and liver function (Dialysis, transplant, Cr >2.26 mg/dL /Cirrhosis or Bilirubin >2x Normal or AST/ALT/AP >3x Normal) {YES/NO:21197} Stroke {YES/NO:21197} Bleeding {YES/NO:21197} Labile INR (Unstable/high INR) {YES/NO:21197} Elderly (>65) {YES/NO:21197} Drugs or alcohol (? 8 drinks/week, anti-plt or NSAID) {YES/NO:21197}   Physical Exam:    VS:  There were no vitals taken for this visit.    Wt Readings from Last 3 Encounters:  02/11/22 226 lb 6.4 oz (102.7 kg)  12/17/21 231 lb (104.8 kg)  10/19/21 231 lb (104.8 kg)     GEN: *** Well nourished, well developed in no acute distress HEENT: Normal NECK: No JVD; No carotid bruits LYMPHATICS: No lymphadenopathy CARDIAC: ***RRR, no murmurs, rubs, gallops RESPIRATORY:  Clear to auscultation without rales, wheezing or rhonchi  ABDOMEN: Soft, non-tender, non-distended MUSCULOSKELETAL:  No edema; No deformity  SKIN: Warm and dry NEUROLOGIC:  Alert and oriented x 3 PSYCHIATRIC:  Normal affect   ASSESSMENT:    No diagnosis found. PLAN:    In order of problems listed above:       {Are you ordering a CV Procedure (e.g. stress test, cath, DCCV, TEE, etc)?   Press F2        :[id number]}    Medication Adjustments/Labs and Tests Ordered: Current medicines are reviewed at length with the patient today.  Concerns regarding medicines are outlined above.  No orders of the defined types were placed in this encounter.  No orders of the defined types were placed in this encounter.   There are no Patient  Instructions on file for this visit.   Signed, Walter Chard, NP  02/11/2022 1:35 PM    [facility]

## 2022-02-12 ENCOUNTER — Encounter: Payer: Medicare Other | Admitting: Physical Medicine & Rehabilitation

## 2022-02-18 ENCOUNTER — Ambulatory Visit: Payer: Medicare Other | Admitting: Physical Therapy

## 2022-02-18 DIAGNOSIS — R2681 Unsteadiness on feet: Secondary | ICD-10-CM

## 2022-02-18 DIAGNOSIS — R278 Other lack of coordination: Secondary | ICD-10-CM

## 2022-02-18 DIAGNOSIS — R2689 Other abnormalities of gait and mobility: Secondary | ICD-10-CM

## 2022-02-19 ENCOUNTER — Encounter: Payer: Self-pay | Admitting: Physical Therapy

## 2022-02-19 NOTE — Therapy (Signed)
OUTPATIENT PHYSICAL THERAPY NEURO TREATMENT NOTE   Patient Name: Walter [name] MRN: [id number] DOB:1956-04-29, 66 y.o., male Today's Date: 02/19/2022   PCP: Clovis Riley, L. August Saucer, MD REFERRING PROVIDER: Micki Riley, MD    PT End of Session - 02/19/22 1642     Visit Number 5    Number of Visits 17    Date for PT Re-Evaluation 03/29/22    Authorization Type Medicare    Authorization Time Period 01-29-22 - 03-29-22    PT Start Time 1450    PT Stop Time 1600    PT Time Calculation (min) 70 min    Equipment Utilized During Treatment Other (comment)   large pool noodle   Activity Tolerance Patient tolerated treatment well    Behavior During Therapy [facility] for tasks assessed/performed             Past Medical History:  Diagnosis Date   Atrial flutter (HCC)    s/p ablation   DVT (deep venous thrombosis) (HCC)    Near syncope 09/25/2013   Paroxysmal atrial fibrillation (HCC)    Stroke [facility])    initially hemorragic (not on OAC) followed by subsequent embolic stroke   Past Surgical History:  Procedure Laterality Date   A-FLUTTER ABLATION N/A 03/09/2020   Procedure: A-FLUTTER ABLATION;  Surgeon: Hillis Range, MD;  Location: [facility] INVASIVE CV LAB;  Service: Cardiovascular;  Laterality: N/A;   CARDIOVERSION N/A 09/27/2013   Procedure: CARDIOVERSION;  Surgeon: Lewayne Bunting, MD;  Location: [facility] ENDOSCOPY;  Service: Cardiovascular;  Laterality: N/A;   IR ANGIO EXTERNAL CAROTID SEL EXT CAROTID UNI R MOD SED  09/26/2020   IR ANGIO INTRA EXTRACRAN SEL COM CAROTID INNOMINATE BILAT MOD SED  09/26/2020   IR ANGIO VERTEBRAL SEL VERTEBRAL UNI R MOD SED  09/26/2020   IR IVC FILTER PLMT / S&I /IMG GUID/MOD SED  10/08/2020   IR IVC FILTER RETRIEVAL / S&I /IMG GUID/MOD SED  09/28/2021   IR IVUS EACH ADDITIONAL NON CORONARY VESSEL  11/02/2020   IR PTA VENOUS EXCEPT DIALYSIS CIRCUIT  11/02/2020   IR RADIOLOGIST EVAL & MGMT  01/17/2021   IR RADIOLOGIST EVAL & MGMT  09/12/2021   IR THROMBECT VENO MECH MOD SED   10/27/2020   IR THROMBECT VENO MECH MOD SED  11/02/2020   IR US GUIDE VASC ACCESS LEFT  10/27/2020   IR US GUIDE VASC ACCESS RIGHT  09/26/2020   IR US GUIDE VASC ACCESS RIGHT  11/02/2020   IR VENO/EXT/UNI RIGHT  11/02/2020   IR VENOCAVAGRAM IVC  10/27/2020   RETINAL DETACHMENT SURGERY     right knee arthroscopy     TEE WITHOUT CARDIOVERSION N/A 09/27/2013   Procedure: TRANSESOPHAGEAL ECHOCARDIOGRAM (TEE);  Surgeon: Lewayne Bunting, MD;  Location: [facility] ENDOSCOPY;  Service: Cardiovascular;  Laterality: N/A;   WRIST SURGERY     Patient Active Problem List   Diagnosis Date Noted   Hypertension 08/16/2021   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   Residual cognitive deficit as late effect of stroke 04/19/2021   Hemiparesis affecting right side as late effect of stroke (HCC) 04/19/2021   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 04/19/2021   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 (HCC) 04/19/2021   Thrombosis    Sleep disturbance    Dysphagia, post-stroke    PAF (paroxysmal atrial fibrillation) (HCC)    Acute pulmonary embolism without acute cor pulmonale (HCC)    Malnutrition of moderate degree 11/04/2020   Tracheostomy care (HCC)    Pressure injury  of skin 10/23/2020   Intracranial hemorrhage (HCC)    Atrial fibrillation (HCC)    Prediabetes    Leukocytosis    Acute blood loss anemia    Hypernatremia    ICH (intracerebral hemorrhage) (HCC) 09/25/2020   Unilateral primary osteoarthritis, left knee 09/26/2016   Atrial flutter (HCC) 09/25/2013   Near syncope 09/25/2013    ONSET DATE: 09-25-20  REFERRING DIAG: I62.9 (ICD-10-CM) - h/o Intracranial hemorrhage (HCC)   THERAPY DIAG:  Unsteadiness on feet  Other abnormalities of gait and mobility  Other lack of coordination  Rationale for Evaluation and Treatment Rehabilitation  SUBJECTIVE:                                                                                                                                                                                               SUBJECTIVE STATEMENT: Pt presents for aquatic therapy at [facility] - pt reports he did the standing balance exercises at home holding onto his U step walker - says he did them by himself but they were a little hard  Pt accompanied by:  wife  PERTINENT HISTORY: Atrial flutter (HCC)s/p ablation DVT (deep venous thrombosis) (HCC) Near syncope03/28/2015 Paroxysmal atrial fibrillation (HCC) Stroke (HCC)initially hemorragic (not on OAC) followed by subsequent embolic stroke; has Lt hemiataxia and sensory deficits on Rt side of body; also 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 No  PRECAUTIONS: Fall (HIGH fall risk)  WEIGHT BEARING RESTRICTIONS No  FALLS: Has patient fallen in last 6 months? Yes. Number of falls approx. 12 falls   LIVING ENVIRONMENT: Lives with: lives with their spouse Lives in: House/apartment Stairs: Yes: Internal: 12 steps; can reach both;  External 8 steps - bil. But cannot reach both Has following equipment at home: Dan Humphreys - 2 wheeled, Shower bench, and Grab bars,manual wheelchair  Pt states they are building new house with 9" slope differential to enter home - is supposed to be ready in near future  PLOF: Independent prior to CVA in March 2022  PATIENT GOALS Improve gait and  balance   OBJECTIVE:  Patient seen for aquatic therapy today.  Treatment took place in water 3.5-4.8 feet deep depending upon activity.  Pt entered and exited the the pool via step negotiation with use of bilateral hand rails with CGA to SBA using step by step sequence.  Pt performed water walking for warm up - forwards, backwards and sideways 18' x approx. 8  reps each direction across width of pool holding onto large yellow noodle for support and assist with balance  Pt performed marching in  place with cues to perform slowly and to tighten core for improved stabilization - 10 reps each leg, holding onto yellow  noodle   Stepping strategy for balance - with CGA to min assist for recovery of LOB; forwards, sideways, and backwards 10 reps each LE - holding onto noodle for support with CGA to min assist for recovery of LOB  Pt performed jogging in 4'8" water depth with use of noodle for support and assist with balance with CGA for balance - 18' x 4 reps  1/2 jumping jacks 10 reps with CGA to min assist;  lunges 5 reps alternating with use of yellow noodle for support  Pt performed 5 jumps - bilateral LE's -holding onto noodle for support   Tall kneeling on pool floor using bench in pool area - 3.6' water depth - pt kneeled on pool floor and used bench for UE support; performed hip abduction 10 reps each leg with assist from PT to stabilize other leg to decrease flotation; - pt performed 5 reps small squats back toward heels for hip strengthening;  tall kneeling position - attempting to maintain position without UE support - pt performed UE flexion unilaterally 3 reps each UE, then bil. UE's together 3 reps;  tall kneeling   Sit to stand 5 reps from bench in pool in 3.6' water depth - no UE support used - min to CGA for recovery of LOB  Performed water walking at end of session with cues to stand erect and ambulate slowly for increased control - 18' x 4 reps forward without use of noodle for UE support   Ai Chi posture "Enclosing" 10 reps with verbal cues for correct sequence with pt able to maintain balance without having to stabilize against pool wall   Pt requires buoyancy of water for support for reduced fall risk with balance exercises in aquatic environment;  exercises able to be performed safely in water compared to that on land with higher fall risk;  current of water provides perturbations for challenges with static & dynamic standing balace.  Pt also requires viscosity of water for strengthening bil. lower extremities and trunk musculature for improved core stabilization.     GOALS: Goals  reviewed with patient? Yes  SHORT TERM GOALS: Target date: 03/01/2022  Pt will be able to perform HEP for balance and core stabilization with assist from family/caregiver as needed for safety. Baseline: dependent Goal status: INITIAL  2.  Sharlene Motts will be performed and LTG updated as appropriate.  Baseline: To be assessed Goal status: INITIAL  3.  Improve TUG score to </= 37 secs with RW with SBA to reduce fall risk. Baseline: 42.25 secs with RW Goal status: INITIAL  4.  Pt will negotiate steps in pool for entering and exiting with use of hand rails with CGA. Baseline: to be assessed Goal status: INITIAL   LONG TERM GOALS: Target date: 03/29/2022  Pt will perform upgraded HEP for balance and coordination exercises and also aquatic exercises with assist from family/caregiver as needed for safety. Baseline:  Goal status: INITIAL  2.  Increase Berg score by at least 4 points to reduce fall risk.  Baseline: To be assessed Goal status: INITIAL  3.  Improve TUG score to </= 32 secs with RW with SBA to reduce fall risk. Baseline: 42.25 secs with RW Goal status: INITIAL  4.  Increase gait velocity to >/= 1.1 ft/sec with use of RW for increased gait efficiency. Baseline: 38.38 secs = .85 ft/sec Goal status:  INITIAL  5.  Pt will amb. 10" nonstop in pool in 4' water depth with supervision without LOB for increased endurance/activity tolerance.  Baseline: to be assessed Goal status: INITIAL  6.  Amb. 115' with RW on flat, even surface with supervision for increased community accessibility.  Baseline:  71' with SBA to CGA  Goal status:  INITIAL  ASSESSMENT:  CLINICAL IMPRESSION: Aquatic therapy session focused on gait training in pool in 4.5' water depth using buoyancy of water for support and for decreased fall risk compared to performance on land. Pt demonstrated improved balance and control with less assistance needed for recovery of LOB; pt able to perform water walking in various  directions without use of large noodle for assist with balance.  Pt also able to perform plyometrics including jumping and jogging without LOB.  Cont with POC.    OBJECTIVE IMPAIRMENTS Abnormal gait, decreased activity tolerance, decreased balance, decreased coordination, decreased knowledge of use of DME, difficulty walking, decreased strength, impaired sensation, and impaired vision/preception.   ACTIVITY LIMITATIONS carrying, lifting, bending, standing, squatting, stairs, transfers, and dressing  PARTICIPATION LIMITATIONS: meal prep, cleaning, laundry, driving, shopping, and community activity  PERSONAL FACTORS Behavior pattern, Past/current experiences, and Time since onset of injury/illness/exacerbation are also affecting patient's functional outcome.   REHAB POTENTIAL: Fair due to chronicity of deficits & time since onset  CLINICAL DECISION MAKING: Evolving/moderate complexity  EVALUATION COMPLEXITY: Moderate  PLAN: PT FREQUENCY: 2x/week  PT DURATION: 8 weeks  PLANNED INTERVENTIONS: Therapeutic exercises, Therapeutic activity, Neuromuscular re-education, Balance training, Gait training, Patient/Family education, Self Care, Stair training, and Aquatic Therapy  PLAN FOR NEXT SESSION: do Berg; check balance HEP   Kayler Rise, Donavan Burnet, PT 02/19/2022, 4:45 PM

## 2022-02-21 ENCOUNTER — Ambulatory Visit: Payer: Medicare Other | Admitting: Physical Therapy

## 2022-02-25 ENCOUNTER — Ambulatory Visit: Payer: Medicare Other | Admitting: Physical Therapy

## 2022-02-25 ENCOUNTER — Encounter: Payer: Self-pay | Admitting: Physical Therapy

## 2022-02-25 DIAGNOSIS — R278 Other lack of coordination: Secondary | ICD-10-CM

## 2022-02-25 DIAGNOSIS — R2689 Other abnormalities of gait and mobility: Secondary | ICD-10-CM | POA: Diagnosis not present

## 2022-02-25 DIAGNOSIS — R2681 Unsteadiness on feet: Secondary | ICD-10-CM

## 2022-02-25 NOTE — Therapy (Signed)
OUTPATIENT PHYSICAL THERAPY NEURO TREATMENT NOTE   Patient Name: Walter [name] MRN: [id number] DOB:1955-11-01, 66 y.o., male Today's Date: 02/25/2022   PCP: Clovis Riley, L. August Saucer, MD REFERRING PROVIDER: Micki Riley, MD    PT End of Session - 02/25/22 1946     Visit Number 6    Number of Visits 17    Date for PT Re-Evaluation 03/29/22    Authorization Type Medicare    Authorization Time Period 01-29-22 - 03-29-22    PT Start Time 1450    PT Stop Time 1533    PT Time Calculation (min) 43 min    Equipment Utilized During Treatment Other (comment)   large pool noodle, bar bells   Activity Tolerance Patient tolerated treatment well    Behavior During Therapy [facility] for tasks assessed/performed             Past Medical History:  Diagnosis Date   Atrial flutter (HCC)    s/p ablation   DVT (deep venous thrombosis) (HCC)    Near syncope 09/25/2013   Paroxysmal atrial fibrillation (HCC)    Stroke [facility])    initially hemorragic (not on OAC) followed by subsequent embolic stroke   Past Surgical History:  Procedure Laterality Date   A-FLUTTER ABLATION N/A 03/09/2020   Procedure: A-FLUTTER ABLATION;  Surgeon: Hillis Range, MD;  Location: [facility] INVASIVE CV LAB;  Service: Cardiovascular;  Laterality: N/A;   CARDIOVERSION N/A 09/27/2013   Procedure: CARDIOVERSION;  Surgeon: Lewayne Bunting, MD;  Location: [facility] ENDOSCOPY;  Service: Cardiovascular;  Laterality: N/A;   IR ANGIO EXTERNAL CAROTID SEL EXT CAROTID UNI R MOD SED  09/26/2020   IR ANGIO INTRA EXTRACRAN SEL COM CAROTID INNOMINATE BILAT MOD SED  09/26/2020   IR ANGIO VERTEBRAL SEL VERTEBRAL UNI R MOD SED  09/26/2020   IR IVC FILTER PLMT / S&I /IMG GUID/MOD SED  10/08/2020   IR IVC FILTER RETRIEVAL / S&I /IMG GUID/MOD SED  09/28/2021   IR IVUS EACH ADDITIONAL NON CORONARY VESSEL  11/02/2020   IR PTA VENOUS EXCEPT DIALYSIS CIRCUIT  11/02/2020   IR RADIOLOGIST EVAL & MGMT  01/17/2021   IR RADIOLOGIST EVAL & MGMT  09/12/2021   IR THROMBECT VENO  MECH MOD SED  10/27/2020   IR THROMBECT VENO MECH MOD SED  11/02/2020   IR US GUIDE VASC ACCESS LEFT  10/27/2020   IR US GUIDE VASC ACCESS RIGHT  09/26/2020   IR US GUIDE VASC ACCESS RIGHT  11/02/2020   IR VENO/EXT/UNI RIGHT  11/02/2020   IR VENOCAVAGRAM IVC  10/27/2020   RETINAL DETACHMENT SURGERY     right knee arthroscopy     TEE WITHOUT CARDIOVERSION N/A 09/27/2013   Procedure: TRANSESOPHAGEAL ECHOCARDIOGRAM (TEE);  Surgeon: Lewayne Bunting, MD;  Location: [facility] ENDOSCOPY;  Service: Cardiovascular;  Laterality: N/A;   WRIST SURGERY     Patient Active Problem List   Diagnosis Date Noted   Hypertension 08/16/2021   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   Residual cognitive deficit as late effect of stroke 04/19/2021   Hemiparesis affecting right side as late effect of stroke (HCC) 04/19/2021   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 04/19/2021   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 (HCC) 04/19/2021   Thrombosis    Sleep disturbance    Dysphagia, post-stroke    PAF (paroxysmal atrial fibrillation) (HCC)    Acute pulmonary embolism without acute cor pulmonale (HCC)    Malnutrition of moderate degree 11/04/2020   Tracheostomy care (HCC)  Pressure injury of skin 10/23/2020   Intracranial hemorrhage (HCC)    Atrial fibrillation (HCC)    Prediabetes    Leukocytosis    Acute blood loss anemia    Hypernatremia    ICH (intracerebral hemorrhage) (HCC) 09/25/2020   Unilateral primary osteoarthritis, left knee 09/26/2016   Atrial flutter (HCC) 09/25/2013   Near syncope 09/25/2013    ONSET DATE: 09-25-20  REFERRING DIAG: I62.9 (ICD-10-CM) - h/o Intracranial hemorrhage (HCC)   THERAPY DIAG:  Unsteadiness on feet  Other abnormalities of gait and mobility  Other lack of coordination  Rationale for Evaluation and Treatment Rehabilitation  SUBJECTIVE:                                                                                                                                                                                               SUBJECTIVE STATEMENT: Pt presents for aquatic therapy at [facility] - states he is scheduled to have surgery on Thursday for Watchman implant  Pt accompanied by:  wife  PERTINENT HISTORY: Atrial flutter (HCC)s/p ablation DVT (deep venous thrombosis) (HCC) Near syncope03/28/2015 Paroxysmal atrial fibrillation (HCC) Stroke (HCC)initially hemorragic (not on OAC) followed by subsequent embolic stroke; has Lt hemiataxia and sensory deficits on Rt side of body; also 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 No  PRECAUTIONS: Fall (HIGH fall risk)  WEIGHT BEARING RESTRICTIONS No  FALLS: Has patient fallen in last 6 months? Yes. Number of falls approx. 12 falls   LIVING ENVIRONMENT: Lives with: lives with their spouse Lives in: House/apartment Stairs: Yes: Internal: 12 steps; can reach both;  External 8 steps - bil. But cannot reach both Has following equipment at home: Dan Humphreys - 2 wheeled, Shower bench, and Grab bars,manual wheelchair  Pt states they are building new house with 9" slope differential to enter home - is supposed to be ready in near future  PLOF: Independent prior to CVA in March 2022  PATIENT GOALS Improve gait and  balance   OBJECTIVE:  Patient seen for aquatic therapy today.  Treatment took place in water 3.5-4.8 feet deep depending upon activity.  Pt entered and exited the the pool via step negotiation with use of bilateral hand rails with CGA to using step by step sequence. Pt had near LOB anteriorly while descending steps - grabbed rails & min assist given for recovery of LOB.   Pt performed water walking for warm up - forwards, backwards and sideways 18' x 4 reps each direction across width of pool Pt performed marching in place with cues to perform slowly and to tighten core  for improved stabilization - 10 reps each leg, holding onto yellow noodle   Stepping strategy for  balance - with CGA to min assist for recovery of LOB; forwards, sideways, and backwards 10 reps each LE - holding onto noodle for support with CGA to min assist for recovery of LOB  Pt performed jogging in 4'8" water depth with use of noodle for support and assist with balance with CGA for balance - 18' x 2 reps  1/2 jumping jacks 10 reps with CGA to min assist;  lunges 5 reps alternating with use of yellow noodle for support   Tall kneeling on pool floor using bench in pool area - 3.6' water depth - pt kneeled on pool floor and used bench for UE support; performed hip abduction 10 reps each leg with assist from PT to stabilize other leg to decrease flotation; - pt performed 5 reps small squats back toward heels for hip strengthening;     Sit to stand 5 reps from bench in pool in 3.6' water depth - no UE support used - min to mod assist on 1st rep for recovery of LOB to Rt side  Performed water walking at end of session with cues to stand erect and ambulate slowly for increased control - 18' x 2 reps forward without use of noodle for UE support   Pt requires buoyancy of water for support for reduced fall risk with balance exercises in aquatic environment;  exercises able to be performed safely in water compared to that on land with higher fall risk;  current of water provides perturbations for challenges with static & dynamic standing balace.  Pt also requires viscosity of water for strengthening bil. lower extremities and trunk musculature for improved core stabilization.     GOALS: Goals reviewed with patient? Yes  SHORT TERM GOALS: Target date: 03/01/2022  Pt will be able to perform HEP for balance and core stabilization with assist from family/caregiver as needed for safety. Baseline: dependent Goal status: INITIAL  2.  Sharlene Motts will be performed and LTG updated as appropriate.  Baseline: To be assessed Goal status: INITIAL  3.  Improve TUG score to </= 37 secs with RW with SBA to reduce  fall risk. Baseline: 42.25 secs with RW Goal status: INITIAL  4.  Pt will negotiate steps in pool for entering and exiting with use of hand rails with CGA. Baseline: to be assessed Goal status: INITIAL   LONG TERM GOALS: Target date: 03/29/2022  Pt will perform upgraded HEP for balance and coordination exercises and also aquatic exercises with assist from family/caregiver as needed for safety. Baseline:  Goal status: INITIAL  2.  Increase Berg score by at least 4 points to reduce fall risk.  Baseline: To be assessed Goal status: INITIAL  3.  Improve TUG score to </= 32 secs with RW with SBA to reduce fall risk. Baseline: 42.25 secs with RW Goal status: INITIAL  4.  Increase gait velocity to >/= 1.1 ft/sec with use of RW for increased gait efficiency. Baseline: 38.38 secs = .85 ft/sec Goal status: INITIAL  5.  Pt will amb. 10" nonstop in pool in 4' water depth with supervision without LOB for increased endurance/activity tolerance.  Baseline: to be assessed Goal status: INITIAL  6.  Amb. 115' with RW on flat, even surface with supervision for increased community accessibility.  Baseline:  46' with SBA to CGA  Goal status:  INITIAL  ASSESSMENT:  CLINICAL IMPRESSION: Aquatic therapy session focused on water  walking and standing balance exercises with use of contralateral UE movement to facilitate improved coordination of UE's and LE's.   Pt continues to need min to mod assist for recovery of LOB and needs cues to perform movements slowly as well as to tighten core for improved stability.  Cont with POC.    OBJECTIVE IMPAIRMENTS Abnormal gait, decreased activity tolerance, decreased balance, decreased coordination, decreased knowledge of use of DME, difficulty walking, decreased strength, impaired sensation, and impaired vision/preception.   ACTIVITY LIMITATIONS carrying, lifting, bending, standing, squatting, stairs, transfers, and dressing  PARTICIPATION LIMITATIONS: meal  prep, cleaning, laundry, driving, shopping, and community activity  PERSONAL FACTORS Behavior pattern, Past/current experiences, and Time since onset of injury/illness/exacerbation are also affecting patient's functional outcome.   REHAB POTENTIAL: Fair due to chronicity of deficits & time since onset  CLINICAL DECISION MAKING: Evolving/moderate complexity  EVALUATION COMPLEXITY: Moderate  PLAN: PT FREQUENCY: 2x/week  PT DURATION: 8 weeks  PLANNED INTERVENTIONS: Therapeutic exercises, Therapeutic activity, Neuromuscular re-education, Balance training, Gait training, Patient/Family education, Self Care, Stair training, and Aquatic Therapy  PLAN FOR NEXT SESSION: do Berg; check balance HEP   Claire Bridge, Donavan Burnet, PT 02/25/2022, 7:49 PM

## 2022-02-26 ENCOUNTER — Ambulatory Visit: Payer: Medicare Other | Admitting: Physical Therapy

## 2022-02-26 ENCOUNTER — Telehealth: Payer: Self-pay

## 2022-02-26 DIAGNOSIS — R2689 Other abnormalities of gait and mobility: Secondary | ICD-10-CM | POA: Diagnosis not present

## 2022-02-26 DIAGNOSIS — R2681 Unsteadiness on feet: Secondary | ICD-10-CM

## 2022-02-26 DIAGNOSIS — R278 Other lack of coordination: Secondary | ICD-10-CM

## 2022-02-26 NOTE — Telephone Encounter (Signed)
Confirmed procedure date of 02/28/2022. Confirmed arrival time of 0700 for procedure time at 0930. Reviewed pre-procedure instructions with patient. The patient understands to call if questions/concerns arise prior to procedure.

## 2022-02-27 ENCOUNTER — Encounter: Payer: Self-pay | Admitting: Physical Therapy

## 2022-02-27 NOTE — Therapy (Signed)
OUTPATIENT PHYSICAL THERAPY NEURO TREATMENT NOTE   Patient Name: Walter [name] MRN: [id number] DOB:[birth year], 66 y.o., male Today's Date: 02/27/2022   PCP: Alroy Dust, L. Marlou Sa, MD REFERRING PROVIDER: Garvin Fila, MD    PT End of Session - 02/27/22 1219     Visit Number 7    Number of Visits 17    Date for PT Re-Evaluation 03/29/22    Authorization Type Medicare    Authorization Time Period 01-29-22 - 03-29-22    PT Start Time 1150    PT Stop Time 1234    PT Time Calculation (min) 44 min    Equipment Utilized During Treatment Gait belt   large pool noodle, bar bells   Activity Tolerance Patient tolerated treatment well    Behavior During Therapy WFL for tasks assessed/performed             Past Medical History:  Diagnosis Date   Atrial flutter ([place])    s/p ablation   DVT (deep venous thrombosis) (HCC)    Near syncope 09/25/2013   Paroxysmal atrial fibrillation ([place])    Stroke [facility])    initially hemorragic (not on [place]) followed by subsequent embolic stroke   Past Surgical History:  Procedure Laterality Date   A-FLUTTER ABLATION N/A 03/09/2020   Procedure: A-FLUTTER ABLATION;  Surgeon: Thompson Grayer, MD;  Location: [place] CV LAB;  Service: Cardiovascular;  Laterality: N/A;   CARDIOVERSION N/A 09/27/2013   Procedure: CARDIOVERSION;  Surgeon: Lelon Perla, MD;  Location: [facility] ENDOSCOPY;  Service: Cardiovascular;  Laterality: N/A;   IR ANGIO EXTERNAL CAROTID SEL EXT CAROTID UNI R MOD SED  09/26/2020   IR ANGIO INTRA EXTRACRAN SEL COM CAROTID INNOMINATE BILAT MOD SED  09/26/2020   IR ANGIO VERTEBRAL SEL VERTEBRAL UNI R MOD SED  09/26/2020   IR IVC FILTER PLMT / S&I /IMG GUID/MOD SED  10/08/2020   IR IVC FILTER RETRIEVAL / S&I /IMG GUID/MOD SED  09/28/2021   IR IVUS EACH ADDITIONAL NON CORONARY VESSEL  11/02/2020   IR PTA VENOUS EXCEPT DIALYSIS CIRCUIT  11/02/2020   IR RADIOLOGIST EVAL & MGMT  01/17/2021   IR RADIOLOGIST EVAL & MGMT  09/12/2021   IR THROMBECT VENO MECH MOD  SED  10/27/2020   IR THROMBECT VENO MECH MOD SED  11/02/2020   IR US GUIDE VASC ACCESS LEFT  10/27/2020   IR US GUIDE VASC ACCESS RIGHT  09/26/2020   IR US GUIDE VASC ACCESS RIGHT  11/02/2020   IR VENO/EXT/UNI RIGHT  11/02/2020   IR VENOCAVAGRAM IVC  10/27/2020   RETINAL DETACHMENT SURGERY     right knee arthroscopy     TEE WITHOUT CARDIOVERSION N/A 09/27/2013   Procedure: TRANSESOPHAGEAL ECHOCARDIOGRAM (TEE);  Surgeon: Lelon Perla, MD;  Location: [facility] ENDOSCOPY;  Service: Cardiovascular;  Laterality: N/A;   WRIST SURGERY     Patient Active Problem List   Diagnosis Date Noted   Hypertension 08/16/2021   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   Residual cognitive deficit as late effect of stroke 04/19/2021   Hemiparesis affecting right side as late effect of stroke ([place]) 04/19/2021   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 04/19/2021   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 ([place]) 04/19/2021   Thrombosis    Sleep disturbance    Dysphagia, post-stroke    PAF (paroxysmal atrial fibrillation) ([place])    Acute pulmonary embolism without acute cor pulmonale (HCC)    Malnutrition of moderate degree 11/04/2020   Tracheostomy care ([place])  Pressure injury of skin 10/23/2020   Intracranial hemorrhage (HCC)    Atrial fibrillation (HCC)    Prediabetes    Leukocytosis    Acute blood loss anemia    Hypernatremia    ICH (intracerebral hemorrhage) ([place]) 09/25/2020   Unilateral primary osteoarthritis, left knee 09/26/2016   Atrial flutter ([place]) 09/25/2013   Near syncope 09/25/2013    ONSET DATE: 09-25-20  REFERRING DIAG: I62.9 (ICD-10-CM) - h/o Intracranial hemorrhage (HCC)   THERAPY DIAG:  Unsteadiness on feet  Other lack of coordination  Other abnormalities of gait and mobility  Rationale for Evaluation and Treatment Rehabilitation  SUBJECTIVE:                                                                                                                                                                                               SUBJECTIVE STATEMENT: Pt presents for aquatic therapy at [place] - states he is scheduled to have surgery on Thursday for Watchman implant  Pt accompanied by:  wife  PERTINENT HISTORY: Atrial flutter (HCC)s/p ablation DVT (deep venous thrombosis) (HCC) Near syncope03/28/2015 Paroxysmal atrial fibrillation (HCC) Stroke (HCC)initially hemorragic (not on [place]) followed by subsequent embolic stroke; has Lt hemiataxia and sensory deficits on Rt side of body; also 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 No  PRECAUTIONS: Fall (HIGH fall risk)  WEIGHT BEARING RESTRICTIONS No  FALLS: Has patient fallen in last 6 months? Yes. Number of falls approx. 12 falls   LIVING ENVIRONMENT: Lives with: lives with their spouse Lives in: House/apartment Stairs: Yes: Internal: 12 steps; can reach both;  External 8 steps - bil. But cannot reach both Has following equipment at home: Gilford Rile - 2 wheeled, Shower bench, and Grab bars,manual wheelchair  Pt states they are building new house with 9" slope differential to enter home - is supposed to be ready in near future  PLOF: Independent prior to CVA in March 2022  PATIENT GOALS Improve gait and  balance   OBJECTIVE:  02-26-22:  Gait:  Pt amb. From lobby to gym with RW with CGA; ataxic gait pattern, approx. 100' from lobby to high/low mat table Gait trained from mat table to // bars approx. 69' with CGA  Gait training inside // bars with 1 UE support with mod assist 10' x 6 reps; progressed to no UE support (pt's progression) 10' x 4 reps with mod to max assist on final rep due to LLE not advancing correctly    NeuroRe-ed: Pt transferred sit to stand from mat table 5 reps without UE support but with wide BOS  to regain balance upon initial sit to stand  Pt transferred from sitting to tall kneeling on floor with min assist Pt performed tall kneeling exercise with  RUE support on mat prn; pt lifted each UE to approx. 100 degrees flexion 3 reps unilaterally, then 3 reps bilaterally Head turns horizontally 3 reps and then vertically 3 reps in tall kneeling position with CGA to min assist  Amb. Forward/backward in tall kneeling position on mat on floor 2 reps with min to mod assist for recovery of LOB with RUE support on mat  RLE 1/2 kneeling position with mod to min assist; LLE 1/2 kneeling position with mod to min assist  Quadruped position - pt performed bird dog pose - lifting opposite UE/LE 3 reps with attempted 5 sec hold 3 reps each side  Pt transferred to seated position on mat (did not completely come to standing) with min assist  Standing balance exercises - used 2 colored discs as targets - pt stood inside // bars with UE support prn - initially used bil. UE support, then 1 UE support - touched targets straight ahead approx.  Reps each leg, then diagonally with bil. UE support 5 reps each with min assist  Used black balance beam - pt stepped over beam and then back behind beam 5 reps each leg with bil. UE support 3 reps, then 1 UE support 3 reps each leg  Attempted touching targets straight ahead 3 reps without UE support but pt needed mod assist for balance     GOALS: Goals reviewed with patient? Yes  SHORT TERM GOALS: Target date: 03/01/2022  Pt will be able to perform HEP for balance and core stabilization with assist from family/caregiver as needed for safety. Baseline: dependent Goal status: Goal met 02-26-22  2.  Merrilee Jansky will be performed and LTG updated as appropriate.  Baseline: To be assessed Goal status: deferred  3.  Improve TUG score to </= 37 secs with RW with SBA to reduce fall risk. Baseline: 42.25 secs with RW Goal status: Ongoing  4.  Pt will negotiate steps in pool for entering and exiting with use of hand rails with CGA. Baseline: to be assessed Goal status:  Goal met - 02-25-22   LONG TERM GOALS: Target date:  03/29/2022  Pt will perform upgraded HEP for balance and coordination exercises and also aquatic exercises with assist from family/caregiver as needed for safety. Baseline:  Goal status: INITIAL  2.  Increase Berg score by at least 4 points to reduce fall risk.  Baseline: To be assessed Goal status: INITIAL  3.  Improve TUG score to </= 32 secs with RW with SBA to reduce fall risk. Baseline: 42.25 secs with RW Goal status: INITIAL  4.  Increase gait velocity to >/= 1.1 ft/sec with use of RW for increased gait efficiency. Baseline: 38.38 secs = .85 ft/sec Goal status: INITIAL  5.  Pt will amb. 10" nonstop in pool in 4' water depth with supervision without LOB for increased endurance/activity tolerance.  Baseline: to be assessed Goal status: INITIAL  6.  Amb. 115' with RW on flat, even surface with supervision for increased community accessibility.  Baseline:  85' with SBA to CGA  Goal status:  INITIAL  ASSESSMENT:  CLINICAL IMPRESSION:   Pt has met STG's # 1 and 4:  STG #2 deferred and STG #3 is ongoing.  PT session focused on activities to improve trunk/core stabilization and strengthen core musc.  Pt tolerated exercises in quadruped position well  with min to mod assist to maintain balance and min to mod assist needed to maintain balance in 1/2 kneeling positions.  Pt has some Lt knee instability as Lt knee noted to slightly flex with LLE SLS activities inside // bars (with RUE support used); ataxia continues in LLE also.  Pt did perform gait training with unilateral UE support inside // bars for first time in today's session.  Cont with POC.  OBJECTIVE IMPAIRMENTS Abnormal gait, decreased activity tolerance, decreased balance, decreased coordination, decreased knowledge of use of DME, difficulty walking, decreased strength, impaired sensation, and impaired vision/preception.   ACTIVITY LIMITATIONS carrying, lifting, bending, standing, squatting, stairs, transfers, and  dressing  PARTICIPATION LIMITATIONS: meal prep, cleaning, laundry, driving, shopping, and community activity  PERSONAL FACTORS Behavior pattern, Past/current experiences, and Time since onset of injury/illness/exacerbation are also affecting patient's functional outcome.   REHAB POTENTIAL: Fair due to chronicity of deficits & time since onset  CLINICAL DECISION MAKING: Evolving/moderate complexity  EVALUATION COMPLEXITY: Moderate  PLAN: PT FREQUENCY: 2x/week  PT DURATION: 8 weeks  PLANNED INTERVENTIONS: Therapeutic exercises, Therapeutic activity, Neuromuscular re-education, Balance training, Gait training, Patient/Family education, Self Care, Stair training, and Aquatic Therapy  PLAN FOR NEXT SESSION: cont balance and gait training   Treshon Stannard, [place], PT 02/27/2022, 12:22 PM

## 2022-02-28 ENCOUNTER — Inpatient Hospital Stay (HOSPITAL_BASED_OUTPATIENT_CLINIC_OR_DEPARTMENT_OTHER)
Admission: RE | Admit: 2022-02-28 | Discharge: 2022-02-28 | Disposition: A | Discharge status: Home / Self Care | Payer: Medicare Other | Source: Ambulatory Visit | Attending: Cardiology | Admitting: Cardiology

## 2022-02-28 ENCOUNTER — Ambulatory Visit (HOSPITAL_COMMUNITY)
Admission: RE | Admit: 2022-02-28 | Discharge: 2022-02-28 | Disposition: A | Discharge status: Home / Self Care | Payer: Medicare Other | Attending: Cardiology | Admitting: Cardiology

## 2022-02-28 ENCOUNTER — Ambulatory Visit (HOSPITAL_COMMUNITY)
Admission: RE | Disposition: A | Discharge status: Home / Self Care | Payer: Self-pay | Source: Home / Self Care | Attending: Cardiology

## 2022-02-28 ENCOUNTER — Other Ambulatory Visit: Payer: Self-pay

## 2022-02-28 ENCOUNTER — Inpatient Hospital Stay (HOSPITAL_COMMUNITY): Payer: Medicare Other | Admitting: Anesthesiology

## 2022-02-28 ENCOUNTER — Ambulatory Visit (HOSPITAL_COMMUNITY): Payer: Medicare Other

## 2022-02-28 DIAGNOSIS — Z7984 Long term (current) use of oral hypoglycemic drugs: Secondary | ICD-10-CM

## 2022-02-28 DIAGNOSIS — I480 Paroxysmal atrial fibrillation: Secondary | ICD-10-CM

## 2022-02-28 DIAGNOSIS — I2510 Atherosclerotic heart disease of native coronary artery without angina pectoris: Secondary | ICD-10-CM | POA: Diagnosis not present

## 2022-02-28 DIAGNOSIS — I483 Typical atrial flutter: Secondary | ICD-10-CM | POA: Diagnosis not present

## 2022-02-28 DIAGNOSIS — Z006 Encounter for examination for normal comparison and control in clinical research program: Secondary | ICD-10-CM

## 2022-02-28 DIAGNOSIS — I4891 Unspecified atrial fibrillation: Secondary | ICD-10-CM

## 2022-02-28 DIAGNOSIS — I4811 Longstanding persistent atrial fibrillation: Secondary | ICD-10-CM | POA: Diagnosis not present

## 2022-02-28 DIAGNOSIS — I10 Essential (primary) hypertension: Secondary | ICD-10-CM | POA: Diagnosis not present

## 2022-02-28 DIAGNOSIS — Z8673 Personal history of transient ischemic attack (TIA), and cerebral infarction without residual deficits: Secondary | ICD-10-CM | POA: Insufficient documentation

## 2022-02-28 DIAGNOSIS — I629 Nontraumatic intracranial hemorrhage, unspecified: Secondary | ICD-10-CM | POA: Diagnosis not present

## 2022-02-28 DIAGNOSIS — Z86718 Personal history of other venous thrombosis and embolism: Secondary | ICD-10-CM | POA: Diagnosis not present

## 2022-02-28 DIAGNOSIS — E119 Type 2 diabetes mellitus without complications: Secondary | ICD-10-CM | POA: Diagnosis not present

## 2022-02-28 DIAGNOSIS — Z87891 Personal history of nicotine dependence: Secondary | ICD-10-CM

## 2022-02-28 DIAGNOSIS — I4892 Unspecified atrial flutter: Secondary | ICD-10-CM | POA: Diagnosis present

## 2022-02-28 DIAGNOSIS — I619 Nontraumatic intracerebral hemorrhage, unspecified: Secondary | ICD-10-CM | POA: Diagnosis present

## 2022-02-28 DIAGNOSIS — Z01818 Encounter for other preprocedural examination: Secondary | ICD-10-CM

## 2022-02-28 HISTORY — PX: TEE WITHOUT CARDIOVERSION: SHX5443

## 2022-02-28 HISTORY — PX: LEFT ATRIAL APPENDAGE OCCLUSION: EP1229

## 2022-02-28 LAB — ABO/RH: ABO/RH(D): O POS

## 2022-02-28 LAB — SURGICAL PCR SCREEN
MRSA, PCR: NEGATIVE
Staphylococcus aureus: NEGATIVE

## 2022-02-28 LAB — TYPE AND SCREEN
ABO/RH(D): O POS
Antibody Screen: NEGATIVE

## 2022-02-28 LAB — POCT ACTIVATED CLOTTING TIME: Activated Clotting Time: 353 seconds

## 2022-02-28 SURGERY — LEFT ATRIAL APPENDAGE OCCLUSION
Anesthesia: General

## 2022-02-28 MED ORDER — CEFAZOLIN SODIUM-DEXTROSE 2-4 GM/100ML-% IV SOLN
2.0000 g | INTRAVENOUS | Status: AC
  Administered 2022-02-28: 2 g via INTRAVENOUS
  Filled 2022-02-28: qty 100

## 2022-02-28 MED ORDER — PHENYLEPHRINE HCL-NACL 20-0.9 MG/250ML-% IV SOLN
INTRAVENOUS | Status: DC | PRN
  Administered 2022-02-28: 15 ug/min via INTRAVENOUS

## 2022-02-28 MED ORDER — HEPARIN (PORCINE) IN NACL 1000-0.9 UT/500ML-% IV SOLN
INTRAVENOUS | Status: AC
  Filled 2022-02-28: qty 500

## 2022-02-28 MED ORDER — HEPARIN (PORCINE) IN NACL 1000-0.9 UT/500ML-% IV SOLN
INTRAVENOUS | Status: DC | PRN
  Administered 2022-02-28: 500 mL

## 2022-02-28 MED ORDER — PROTAMINE SULFATE 10 MG/ML IV SOLN
INTRAVENOUS | Status: DC | PRN
  Administered 2022-02-28: 20 mg via INTRAVENOUS

## 2022-02-28 MED ORDER — DEXAMETHASONE SODIUM PHOSPHATE 10 MG/ML IJ SOLN
INTRAMUSCULAR | Status: DC | PRN
  Administered 2022-02-28: 5 mg via INTRAVENOUS

## 2022-02-28 MED ORDER — ROCURONIUM BROMIDE 10 MG/ML (PF) SYRINGE
PREFILLED_SYRINGE | INTRAVENOUS | Status: DC | PRN
  Administered 2022-02-28: 30 mg via INTRAVENOUS
  Administered 2022-02-28: 70 mg via INTRAVENOUS

## 2022-02-28 MED ORDER — SODIUM CHLORIDE 0.9 % IV SOLN: INTRAVENOUS | Status: DC

## 2022-02-28 MED ORDER — HEPARIN SODIUM (PORCINE) 1000 UNIT/ML IJ SOLN
INTRAMUSCULAR | Status: AC
  Filled 2022-02-28: qty 10

## 2022-02-28 MED ORDER — CHLORHEXIDINE GLUCONATE 0.12 % MT SOLN
OROMUCOSAL | Status: AC
  Filled 2022-02-28: qty 15

## 2022-02-28 MED ORDER — FENTANYL CITRATE (PF) 100 MCG/2ML IJ SOLN
INTRAMUSCULAR | Status: AC
  Filled 2022-02-28: qty 2

## 2022-02-28 MED ORDER — ONDANSETRON HCL 4 MG/2ML IJ SOLN
INTRAMUSCULAR | Status: DC | PRN
  Administered 2022-02-28: 4 mg via INTRAVENOUS

## 2022-02-28 MED ORDER — LACTATED RINGERS IV SOLN: INTRAVENOUS | Status: DC

## 2022-02-28 MED ORDER — LIDOCAINE 2% (20 MG/ML) 5 ML SYRINGE
INTRAMUSCULAR | Status: DC | PRN
  Administered 2022-02-28: 100 mg via INTRAVENOUS

## 2022-02-28 MED ORDER — PROPOFOL 10 MG/ML IV BOLUS
INTRAVENOUS | Status: DC | PRN
  Administered 2022-02-28: 150 mg via INTRAVENOUS

## 2022-02-28 MED ORDER — SUGAMMADEX SODIUM 200 MG/2ML IV SOLN
INTRAVENOUS | Status: DC | PRN
  Administered 2022-02-28: 200 mg via INTRAVENOUS

## 2022-02-28 MED ORDER — HEPARIN SODIUM (PORCINE) 1000 UNIT/ML IJ SOLN
INTRAMUSCULAR | Status: DC | PRN
  Administered 2022-02-28: 15000 [IU] via INTRAVENOUS

## 2022-02-28 MED ORDER — HEPARIN (PORCINE) IN NACL 2000-0.9 UNIT/L-% IV SOLN
INTRAVENOUS | Status: DC | PRN
  Administered 2022-02-28: 1000 mL

## 2022-02-28 MED ORDER — FENTANYL CITRATE (PF) 250 MCG/5ML IJ SOLN
INTRAMUSCULAR | Status: DC | PRN
Start: 2022-02-28 — End: 2022-02-28
  Administered 2022-02-28: 100 ug via INTRAVENOUS

## 2022-02-28 SURGICAL SUPPLY — 21 items
BAG SNAP BAND KOVER 36X36 (MISCELLANEOUS) IMPLANT
CATH DIAG 6FR PIGTAIL ANGLED (CATHETERS) IMPLANT
CATH NUVISION NON NAV ICE (CATHETERS) IMPLANT
CLOSURE PERCLOSE PROSTYLE (VASCULAR PRODUCTS) IMPLANT
COVER SWIFTLINK CONNECTOR (BAG) IMPLANT
DEVICE WATCHMAN FLX PROC (KITS) IMPLANT
DILATOR VESSEL 38 20CM 11FR (INTRODUCER) IMPLANT
KIT HEART LEFT (KITS) ×1 IMPLANT
KIT SHEA VERSACROSS LAAC CONNE (KITS) IMPLANT
PACK CARDIAC CATHETERIZATION (CUSTOM PROCEDURE TRAY) ×1 IMPLANT
PAD DEFIB RADIO PHYSIO CONN (PAD) ×1 IMPLANT
SHEATH AVANTI 11F 11CM (SHEATH) IMPLANT
SHEATH PERFORMER 16FR 30 (SHEATH) IMPLANT
SHEATH PINNACLE 8F 10CM (SHEATH) IMPLANT
SHEATH PROBE COVER 6X72 (BAG) ×1 IMPLANT
SYS WATCHMAN FXD DBL (SHEATH) ×1
SYSTEM WATCHMAN FXD DBL (SHEATH) IMPLANT
TRANSDUCER W/STOPCOCK (MISCELLANEOUS) ×1 IMPLANT
TUBING CIL FLEX 10 FLL-RA (TUBING) ×1 IMPLANT
WATCHMAN FLX PROCEDURE DEVICE (KITS) ×1 IMPLANT
WATCHMAN PROCED TRUSEAL ACCESS (SHEATH) IMPLANT

## 2022-02-28 NOTE — Transfer of Care (Signed)
Immediate Anesthesia Transfer of Care Note  Patient: AUGUSTO DECKMAN  Procedure(s) Performed: LEFT ATRIAL APPENDAGE OCCLUSION TRANSESOPHAGEAL ECHOCARDIOGRAM (TEE)  Patient Location: Cath Lab  Anesthesia Type:General  Level of Consciousness: drowsy  Airway & Oxygen Therapy: Patient Spontanous Breathing and Patient connected to nasal cannula oxygen  Post-op Assessment: Report given to RN and Post -op Vital signs reviewed and stable  Post vital signs: Reviewed and stable  Last Vitals:  Vitals Value Taken Time  BP 141/65 02/28/22 1142  Temp    Pulse 71 02/28/22 1144  Resp 13 02/28/22 1144  SpO2 96 % 02/28/22 1144  Vitals shown include unvalidated device data.  Last Pain:  Vitals:   02/28/22 0831  TempSrc:   PainSc: 0-No pain         Complications: There were no known notable events for this encounter.

## 2022-02-28 NOTE — Anesthesia Procedure Notes (Signed)
Arterial Line Insertion Start/End8/31/2023 9:45 AM, 02/28/2022 9:55 AM Performed by: Lelon Perla, CRNA, CRNA  Patient location: Pre-op. Preanesthetic checklist: patient identified, IV checked, site marked, risks and benefits discussed, surgical consent, monitors and equipment checked, pre-op evaluation, timeout performed and anesthesia consent Lidocaine 1% used for infiltration Right, radial was placed Catheter size: 20 G Hand hygiene performed  and maximum sterile barriers used   Attempts: 1 Procedure performed without using ultrasound guided technique. Following insertion, dressing applied and Biopatch. Post procedure assessment: normal and unchanged  Patient tolerated the procedure well with no immediate complications.

## 2022-02-28 NOTE — Progress Notes (Signed)
  HEART AND VASCULAR CENTER   MULTIDISCIPLINARY HEART TEAM   Patient doing well s/p aborted Watchman implant. He is hemodynamically stable. Plan for early ambulation after bedrest completed and discharge home later today. I will see him back in the office 03/27/22 with repeat TEE 04/02/22.   Georgie Chard NP-C Structural Heart Team  Pager: [phone] Phone: [phone]

## 2022-02-28 NOTE — Anesthesia Preprocedure Evaluation (Addendum)
Anesthesia Evaluation    Reviewed: Allergy & Precautions, Patient's Chart, lab work & pertinent test results, Unable to perform ROS - Chart review only  Airway Mallampati: III  TM Distance: >3 FB Neck ROM: Full    Dental  (+) Dental Advisory Given, Teeth Intact   Pulmonary neg pulmonary ROS,    Pulmonary exam normal breath sounds clear to auscultation       Cardiovascular hypertension, Normal cardiovascular exam Rhythm:Regular Rate:Normal  Echo 09/2021 1. Left ventricular ejection fraction, by estimation, is 60 to 65%. The left ventricle has normal function. The left ventricle has no regional wall motion abnormalities. There is moderate left ventricular hypertrophy. Left ventricular diastolic parameters are consistent with Grade I diastolic dysfunction (impaired relaxation).  2. Right ventricular systolic function is normal. The right ventricular size is normal. Tricuspid regurgitation signal is inadequate for assessing PA pressure.  3. The mitral valve is abnormal. Mild mitral valve regurgitation.  4. The aortic valve was not well visualized. Aortic valve regurgitation is not visualized.  5. The inferior vena cava is normal in size with greater than 50% respiratory variability, suggesting right atrial pressure of 3 mmHg.    Neuro/Psych CVA    GI/Hepatic negative GI ROS, Neg liver ROS,   Endo/Other  negative endocrine ROS  Renal/GU negative Renal ROS     Musculoskeletal  (+) Arthritis ,   Abdominal (+) + obese,   Peds  Hematology  (+) Blood dyscrasia, anemia ,   Anesthesia Other Findings   Reproductive/Obstetrics                           Anesthesia Physical Anesthesia Plan  ASA: 3  Anesthesia Plan: General   Post-op Pain Management: Minimal or no pain anticipated   Induction: Intravenous  PONV Risk Score and Plan: 2 and Ondansetron, Dexamethasone and Treatment may vary due to age or  medical condition  Airway Management Planned: Oral ETT  Additional Equipment: Arterial line  Intra-op Plan:   Post-operative Plan: Extubation in OR  Informed Consent: I have reviewed the patients History and Physical, chart, labs and discussed the procedure including the risks, benefits and alternatives for the proposed anesthesia with the patient or authorized representative who has indicated his/her understanding and acceptance.     Dental advisory given  Plan Discussed with: CRNA  Anesthesia Plan Comments:       Anesthesia Quick Evaluation

## 2022-02-28 NOTE — Discharge Summary (Addendum)
HEART AND VASCULAR CENTER    Patient ID: Walter [name],  MRN: [id number], DOB/AGE: May 22, 1956 66 y.o.  Admit date: 02/28/2022 Discharge date: 02/28/2022  Primary Care Physician: Clovis Riley, Elbert Ewings.August Saucer, MD  Primary Cardiologist: None  Electrophysiologist: Lanier Prude, MD  Primary Discharge Diagnosis:  Paroxysmal Atrial Fibrillation Poor candidacy for long term anticoagulation due to h/o intracranial hemorrhage  Secondary Discharge Diagnosis:  -DVT -Atrial fibrillation/atrial flutter -cardio-embolic CVA  Procedures This Admission:   PREPROCEDURE DIAGNOSIS: 1. Atrial fibrillation 2.  History of intracranial hemorrhage  CONCLUSIONS:  1. Aborted watchman implant due to possible left atrial appendage thrombus 2. TEE demonstrating dilated left atrium and possible left atrial appendage thrombus  3.  Continue OAC.  Transesophageal echo planned for 4 weeks to reassess left atrial appendage.  Brief HPI: Walter [name] is a 66 y.o. male with a history of paroxysmal atrial fibrillation, atrial flutter s/p ablation, DVT, and spontaneous intracranial hemorrhage 08/2020 (not on anticoagulation) and is now s/p cardioembolic CVA.    [name] was diagnosed with AF 08/2020. He presented to [facility]20 after awaking from sleep with slurred speech and HA. On ED arrival he was intubated due to lethargy. Emergent head CT was performed which showed a left midbrain hemorrhage involving the dorsal part of the pons as well with extension into the cerebral aqueduct and fourth ventricle with local mass-effect on the brainstem but no hydrocephalus. CT angiogram of the head and neck showed no large vessel occlusion or AV malformation. He was started on hypertonic saline and had repeat CT scan which showed worsening hydrocephalus and neurosurgery was consulted who placed a right frontal ventriculostomy catheter. MRI scan of the brain was obtained which confirmed acute hemorrhage within the left dorsal  midbrain and pons extending to inferior medial thalamus with intraventricular extension. After EVD placement, he showed neurological improvement. Hospital course was c/b a DVT and PE requiring anticoagulation. He also required a tracheostomy and IVC filter which has since been removed.    He was transferred to to inpatient rehab on 4/28 where he stayed 4 weeks and went home 11/23/20. In last follow up with Dr. Pearlean Brownie 09/2021, he was felt to be a good candidate for Watchman implant. He was referred for LAAO by Dr. Ladona Ridgel.   He was seen by Dr. Lalla Brothers in consultation and felt to be a good candidate for LAAO closure. CT showed anatomy suitable for Watchman implant scheduled for 02/28/22.    Hospital Course:  The patient was admitted and underwent attempted LAAO closure  however case was aborted after echodensity seen on TEE with concern for possible LAAO thrombus. He was monitored on telemetry in the post procedure setting and will be discharged later today, 02/28/22. Groin site has been without complication. Wound care and restrictions were reviewed with the patient. Plan will be to restart home Eliquis with follow up with myself on 9/28 and repeat TEE 10/4.   PAF: Case aborted. See above.   HTN: Stable at 130/84 with no changes needed today   Ischemic hemorraghic stroke: No new neuro changes. Follows closely with Dr. Pearlean Brownie. Plan for Watchman implant 8/31 with plans to be off AC after 6 months.   Physical Exam: Vitals:   02/28/22 1245 02/28/22 1250 02/28/22 1255 02/28/22 1257  BP: 139/78 126/70 139/75 (!) 141/65  Pulse: 74 74 73 75  Resp: 10 14 11 10   Temp:      TempSrc:      SpO2: 96% 92% 93% 91%  Weight:      Height:        Labs:   Lab Results  Component Value Date   WBC 5.4 02/11/2022   HGB 15.1 02/11/2022   HCT 44.6 02/11/2022   MCV 93 02/11/2022   PLT 248 02/11/2022   No results for input(s): "NA", "K", "CL", "CO2", "BUN", "CREATININE", "CALCIUM", "PROT", "BILITOT", "ALKPHOS",  "ALT", "AST", "GLUCOSE" in the last 168 hours.  Invalid input(s): "LABALBU"  Discharge Medications:  Allergies as of 02/28/2022       Reactions   Keppra [levetiracetam] Swelling   Patient experienced angioedema post inpatient keppra dose   Latex Itching   Skin turns real red        Medication List     TAKE these medications    apixaban 5 MG Tabs tablet Commonly known as: Eliquis Take 1 tablet (5 mg total) by mouth 2 (two) times daily.   atorvastatin 20 MG tablet Commonly known as: LIPITOR Take 1 tablet (20 mg total) by mouth at bedtime.   diltiazem 120 MG 24 hr capsule Commonly known as: CARDIZEM CD Take 1 capsule (120 mg total) by mouth daily.   EPINEPHrine 0.3 mg/0.3 mL Soaj injection Commonly known as: EPI-PEN Inject 0.3 mg into the muscle as needed for anaphylaxis.   mirtazapine 7.5 MG tablet Commonly known as: REMERON TAKE 2 TABLETS BY MOUTH AT BEDTIME What changed: how much to take   pregabalin 50 MG capsule Commonly known as: LYRICA Take 1 capsule (50 mg total) by mouth 2 (two) times daily.   sertraline 100 MG tablet Commonly known as: ZOLOFT Take 100 mg by mouth at bedtime.       Disposition:  Home  Discharge Instructions     Diet - low sodium heart healthy   Complete by: As directed    Increase activity slowly   Complete by: As directed        Follow-up Information     Filbert Schilder, NP Follow up on 03/27/2022.   Specialty: Cardiology Why: @945 . Please arrive to your appointment at 9:30am. Contact information: [address] [place] [phone]                 Duration of Discharge Encounter: Greater than 30 minutes including physician time.  Signed, [phone], NP  02/28/2022 1:08 PM

## 2022-02-28 NOTE — H&P (Signed)
Electrophysiology Office Follow up Visit Note:     Date:  10/20/2021    ID:  Walter [name], DOB 1955/07/10, MRN [id number]   PCP:  Asencion Gowda.August Saucer, MD       [facility] Electrophysiologist:  Lanier Prude, MD      Interval History:     Walter [name] is a 66 y.o. male who presents for a follow up visit. They were last seen in clinic October 05, 2021 for his history of atrial fibrillation and intracranial hemorrhage.  His intracranial hemorrhage occurred in the absence of anticoagulation.  He has subsequently had a cardioembolic stroke.  I was originally referred the patient by Dr. Ladona Ridgel who is his primary electrophysiologist for consideration of watchman given his history of spontaneous intracranial hemorrhage and need for long-term anticoagulation.   I originally saw the patient October 05, 2021 in consultation for possible watchman implant.  I thought he was a good candidate at that time.  He then saw Dr. Pearlean Brownie from neurology.  The patient and his wife present to clinic today after their appointment with Dr. Pearlean Brownie for continued discussions about left atrial appendage occlusion.     Walter [name] presents today for Watchman implant with TEE+/-ICE guidance. Procedure reviewed.      Objective      Past Medical History:  Diagnosis Date   Atrial flutter [facility])      s/p ablation   DVT (deep venous thrombosis) (HCC)     Near syncope 09/25/2013   Paroxysmal atrial fibrillation (HCC)     Stroke [facility])      initially hemorragic (not on OAC) followed by subsequent embolic stroke           Past Surgical History:  Procedure Laterality Date   A-FLUTTER ABLATION N/A 03/09/2020    Procedure: A-FLUTTER ABLATION;  Surgeon: Hillis Range, MD;  Location: [facility] INVASIVE CV LAB;  Service: Cardiovascular;  Laterality: N/A;   CARDIOVERSION N/A 09/27/2013    Procedure: CARDIOVERSION;  Surgeon: Lewayne Bunting, MD;  Location: [facility] ENDOSCOPY;  Service: Cardiovascular;  Laterality: N/A;   IR ANGIO EXTERNAL  CAROTID SEL EXT CAROTID UNI R MOD SED   09/26/2020   IR ANGIO INTRA EXTRACRAN SEL COM CAROTID INNOMINATE BILAT MOD SED   09/26/2020   IR ANGIO VERTEBRAL SEL VERTEBRAL UNI R MOD SED   09/26/2020   IR IVC FILTER PLMT / S&I /IMG GUID/MOD SED   10/08/2020   IR IVC FILTER RETRIEVAL / S&I /IMG GUID/MOD SED   09/28/2021   IR IVUS EACH ADDITIONAL NON CORONARY VESSEL   11/02/2020   IR PTA VENOUS EXCEPT DIALYSIS CIRCUIT   11/02/2020   IR RADIOLOGIST EVAL & MGMT   01/17/2021   IR RADIOLOGIST EVAL & MGMT   09/12/2021   IR THROMBECT VENO MECH MOD SED   10/27/2020   IR THROMBECT VENO MECH MOD SED   11/02/2020   IR US GUIDE VASC ACCESS LEFT   10/27/2020   IR US GUIDE VASC ACCESS RIGHT   09/26/2020   IR US GUIDE VASC ACCESS RIGHT   11/02/2020   IR VENO/EXT/UNI RIGHT   11/02/2020   IR VENOCAVAGRAM IVC   10/27/2020   RETINAL DETACHMENT SURGERY       right knee arthroscopy       TEE WITHOUT CARDIOVERSION N/A 09/27/2013    Procedure: TRANSESOPHAGEAL ECHOCARDIOGRAM (TEE);  Surgeon: Lewayne Bunting, MD;  Location: [facility] ENDOSCOPY;  Service: Cardiovascular;  Laterality: N/A;   WRIST SURGERY  Current Medications: Active Medications      Current Meds  Medication Sig   apixaban (ELIQUIS) 5 MG TABS tablet Take 1 tablet (5 mg total) by mouth 2 (two) times daily.   atorvastatin (LIPITOR) 20 MG tablet Take 1 tablet (20 mg total) by mouth at bedtime.   diltiazem (CARDIZEM CD) 120 MG 24 hr capsule Take 1 capsule (120 mg total) by mouth daily.   EPINEPHrine 0.3 mg/0.3 mL IJ SOAJ injection Inject 0.3 mg into the muscle as needed for anaphylaxis.   mirtazapine (REMERON) 7.5 MG tablet TAKE 2 TABLETS BY MOUTH AT BEDTIME   pregabalin (LYRICA) 50 MG capsule Take 1 capsule (50 mg total) by mouth 2 (two) times daily.   sertraline (ZOLOFT) 100 MG tablet Take 100 mg by mouth daily.        Allergies:   Keppra [levetiracetam] and Latex    Social History         Socioeconomic History   Marital status: Married      Spouse name: cathy    Number of children: Not on file   Years of education: Not on file   Highest education level: Not on file  Occupational History   Not on file  Tobacco Use   Smoking status: Never   Smokeless tobacco: Never  Vaping Use   Vaping Use: Never used  Substance and Sexual Activity   Alcohol use: Yes      Comment: occasionally   Drug use: No   Sexual activity: Not on file  Other Topics Concern   Not on file  Social History Narrative    Lives with wife at home    Right Handed    Drinks decaf    Social Determinants of Health    Financial Resource Strain: Not on file  Food Insecurity: Not on file  Transportation Needs: Not on file  Physical Activity: Not on file  Stress: Not on file  Social Connections: Not on file      Family History: The patient's family history includes Healthy in his brother and sister; Heart disease in his father and mother.   ROS:   Please see the history of present illness.    All other systems reviewed and are negative.   EKGs/Labs/Other Studies Reviewed:     The following studies were reviewed today:           Recent Labs: 10/29/2020: ALT 45 08/15/2021: Pro B Natriuretic peptide (BNP) 32.0; TSH 1.92 09/28/2021: BUN 10; Creatinine, Ser 0.91; Hemoglobin 15.3; Platelets 216; Potassium 4.1; Sodium 138  Recent Lipid Panel Labs (Brief)          Component Value Date/Time    CHOL 155 09/25/2020 1659    TRIG 117 09/25/2020 1659    HDL 44 09/25/2020 1659    CHOLHDL 3.5 09/25/2020 1659    VLDL 23 09/25/2020 1659    LDLCALC 88 09/25/2020 1659        Physical Exam:     VS:  BP 139/79   Pulse 68   Ht 6' (1.829 m)   Wt 231 lb (104.8 kg) Comment: per patient :wheelchair"  SpO2 94%   BMI 31.33 kg/m         Wt Readings from Last 3 Encounters:  10/19/21 231 lb (104.8 kg)  10/05/21 229 lb 9.6 oz (104.1 kg)  09/28/21 225 lb (102.1 kg)      GEN:  Well nourished, well developed in no acute distress HEENT: Normal NECK: No JVD; No carotid  bruits LYMPHATICS: No lymphadenopathy CARDIAC: RRR, no murmurs, rubs, gallops RESPIRATORY:  Clear to auscultation without rales, wheezing or rhonchi  ABDOMEN: Soft, non-tender, non-distended MUSCULOSKELETAL:  No edema; No deformity  SKIN: Warm and dry NEUROLOGIC:  Alert and oriented x 3.  Stable neuro deficits from prior stroke. PSYCHIATRIC:  Normal affect            Assessment ASSESSMENT:     1. PAF (paroxysmal atrial fibrillation) (HCC)   2. Typical atrial flutter (HCC)   3. Primary hypertension   4. Intracranial hemorrhage (HCC)     PLAN:     In order of problems listed above:     #Paroxysmal atrial fibrillation #Spontaneous intracranial hemorrhage We again reviewed his history of atrial fibrillation and the associated risk of embolic stroke.  We discussed the risk of bleeding while on anticoagulation.  Given the patient's history of spontaneous intracranial hemorrhage, I do think he is at an elevated risk for significant bleeding while on anticoagulation.  I again discussed this with Dr. Pearlean Brownie on the telephone yesterday.  Dr. Pearlean Brownie is unclear as to why Walter. [name] had his original intracranial hemorrhage.  Given the lack of clarity on the etiology of his original intracranial hemorrhage, I do think he is a very appropriate candidate for left atrial appendage occlusion as a stroke mitigation strategy.     I have asked the patient to think about the procedure at home with his family before making a final decision about how to proceed.  If they would like to proceed with watchman, they can reach out and we will get things scheduled for them.  In the interim, Dr. Pearlean Brownie has recommended he restart Eliquis.   ---------------   I have seen Walter [name] in the office today who is being considered for a Watchman left atrial appendage closure device. I believe they will benefit from this procedure given their history of atrial fibrillation, CHA2DS2-VASc score of 4 and unadjusted  ischemic stroke rate of 4.8% per year. Unfortunately, the patient is not felt to be a long term anticoagulation candidate secondary to history of hemorrhagic stroke. The patient's chart has been reviewed and I feel that they would be a candidate for short term oral anticoagulation after Watchman implant.    It is my belief that after undergoing a LAA closure procedure, Walter [name] will not need long term anticoagulation which eliminates anticoagulation side effects and major bleeding risk.    Procedural risks for the Watchman implant have been reviewed with the patient including a 0.5% risk of stroke, <1% risk of perforation and <1% risk of device embolization.      The published clinical data on the safety and effectiveness of WATCHMAN include but are not limited to the following: - Holmes DR, Everlene Farrier, Sick P et al. for the PROTECT AF Investigators. Percutaneous closure of the left atrial appendage versus warfarin therapy for prevention of stroke in patients with atrial fibrillation: a randomised non-inferiority trial. Lancet 2009; 374: 534-42. Everlene Farrier, Doshi SK, Isa Rankin D et al. on behalf of the PROTECT AF Investigators. Percutaneous Left Atrial Appendage Closure for Stroke Prophylaxis in Patients With Atrial Fibrillation 2.3-Year Follow-up of the PROTECT AF (Watchman Left Atrial Appendage System for Embolic Protection in Patients With Atrial Fibrillation) Trial. Circulation 2013; 127:720-729. - Alli O, Doshi S,  Kar S, Reddy VY, Sievert H et al. Quality of Life Assessment in the Randomized PROTECT AF (Percutaneous Closure of the Left Atrial Appendage Versus Warfarin  Therapy for Prevention of Stroke in Patients With Atrial Fibrillation) Trial of Patients at Risk for Stroke With Nonvalvular Atrial Fibrillation. J Am Coll Cardiol 2013; 61:1790-8. Aline August DR, Mia [place], Price M, Whisenant B, Sievert H, Doshi S, Huber K, Reddy V. Prospective randomized evaluation of the Watchman left atrial  appendage Device in patients with atrial fibrillation versus long-term warfarin therapy; the PREVAIL trial. Journal of the [employer] of Cardiology, Vol. 4, No. 1, 2014, 1-11. - Kar S, Doshi SK, Sadhu A, Horton R, Osorio J et al. Primary outcome evaluation of a next-generation left atrial appendage closure device: results from the PINNACLE FLX trial. Circulation 2021;143(18)1754-1762.      After today's visit with the patient which was dedicated solely for shared decision making visit regarding LAA closure device, the patient decided to proceed with the LAA appendage closure procedure scheduled to be done in the near future at [facility]. Prior to the procedure, I would like to obtain a gated CT scan of the chest with contrast timed for PV/LA visualization.      HAS-BLED score 4 Hypertension Yes  Abnormal renal and liver function (Dialysis, transplant, Cr >2.26 mg/dL /Cirrhosis or Bilirubin >2x Normal or AST/ALT/AP >3x Normal) No  Stroke Yes  Bleeding Yes  Labile INR (Unstable/high INR) No  Elderly (>65) Yes  Drugs or alcohol (? 8 drinks/week, anti-plt or NSAID) No    CHA2DS2-VASc Score = 4  The patient's score is based upon: CHF History: 0 HTN History: 1 Diabetes History: 0 Stroke History: 2 Vascular Disease History: 0 Age Score: 1 Gender Score: 0    Presents today for LAAO w  TEE +/- ICE. Procedure reviewed including risks and he wishes to proceed.          Signed, Steffanie Dunn, MD, [facility], [facility] 02/28/2022 Electrophysiology [facility]

## 2022-02-28 NOTE — Anesthesia Postprocedure Evaluation (Signed)
Anesthesia Post Note  Patient: Walter [name]  Procedure(s) Performed: LEFT ATRIAL APPENDAGE OCCLUSION TRANSESOPHAGEAL ECHOCARDIOGRAM (TEE)     Patient location during evaluation: PACU Anesthesia Type: General Level of consciousness: sedated and patient cooperative Pain management: pain level controlled Vital Signs Assessment: post-procedure vital signs reviewed and stable Respiratory status: spontaneous breathing Cardiovascular status: stable Anesthetic complications: no   There were no known notable events for this encounter.  Last Vitals:  Vitals:   02/28/22 1331 02/28/22 1345  BP: 128/76 117/73  Pulse: 67 69  Resp: (!) 9 10  Temp:    SpO2: 90% 91%    Last Pain:  Vitals:   02/28/22 1330  TempSrc:   PainSc: 0-No pain                 Lewie Loron

## 2022-02-28 NOTE — Progress Notes (Signed)
  Echocardiogram Echocardiogram Transesophageal has been performed.  Janalyn Harder 02/28/2022, 11:35 AM

## 2022-02-28 NOTE — Anesthesia Procedure Notes (Signed)
Procedure Name: Intubation Date/Time: 02/28/2022 10:26 AM  Performed by: Alease Medina, CRNAPre-anesthesia Checklist: Patient identified, Emergency Drugs available, Suction available and Patient being monitored Patient Re-evaluated:Patient Re-evaluated prior to induction Oxygen Delivery Method: Circle system utilized Preoxygenation: Pre-oxygenation with 100% oxygen Induction Type: IV induction Ventilation: Mask ventilation without difficulty and Oral airway inserted - appropriate to patient size Laryngoscope Size: Glidescope and 4 Grade View: Grade I Tube type: Oral Tube size: 7.0 mm Number of attempts: 1 Airway Equipment and Method: Stylet and Oral airway Placement Confirmation: ETT inserted through vocal cords under direct vision, positive ETCO2 and breath sounds checked- equal and bilateral Secured at: 22 cm Tube secured with: Tape Dental Injury: Teeth and Oropharynx as per pre-operative assessment

## 2022-03-01 ENCOUNTER — Encounter (HOSPITAL_COMMUNITY): Payer: Self-pay | Admitting: Cardiology

## 2022-03-01 ENCOUNTER — Telehealth: Payer: Self-pay | Admitting: Cardiology

## 2022-03-01 NOTE — Telephone Encounter (Signed)
  [facility]   MULTIDISCIPLINARY HEART VALVE TEAM   Patient contacted regarding discharge from [facility] on 02/28/22   Patient understands to follow up with provider Dirck Butch NP-C at the [address] office  Patient understands discharge instructions? Yes  Patient understands medications and regimen? Yes  Patient understands to bring all medications to this visit? Yes   Lanai Conlee NP-C Structural Heart Team  Pager: [phone]     

## 2022-03-04 MED FILL — Fentanyl Citrate Preservative Free (PF) Inj 100 MCG/2ML: INTRAMUSCULAR | Qty: 2 | Status: AC

## 2022-03-06 ENCOUNTER — Encounter: Payer: Self-pay | Admitting: Physical Therapy

## 2022-03-06 ENCOUNTER — Ambulatory Visit: Payer: Medicare Other | Attending: Family Medicine | Admitting: Physical Therapy

## 2022-03-06 DIAGNOSIS — R2689 Other abnormalities of gait and mobility: Secondary | ICD-10-CM | POA: Diagnosis present

## 2022-03-06 DIAGNOSIS — R278 Other lack of coordination: Secondary | ICD-10-CM | POA: Insufficient documentation

## 2022-03-06 DIAGNOSIS — R2681 Unsteadiness on feet: Secondary | ICD-10-CM | POA: Diagnosis present

## 2022-03-06 NOTE — Therapy (Signed)
OUTPATIENT PHYSICAL THERAPY NEURO TREATMENT NOTE   Patient Name: Walter [name] MRN: [id number] DOB:1955-12-14, 66 y.o., male Today's Date: 03/06/2022   PCP: Alroy Dust, L. Marlou Sa, MD REFERRING PROVIDER: Garvin Fila, MD    PT End of Session - 03/06/22 1925     Visit Number 8    Number of Visits 17    Date for PT Re-Evaluation 03/29/22    Authorization Type Medicare    Authorization Time Period 01-29-22 - 03-29-22    PT Start Time 1450    PT Stop Time 1535    PT Time Calculation (min) 45 min    Activity Tolerance Patient tolerated treatment well    Behavior During Therapy [facility] for tasks assessed/performed             Past Medical History:  Diagnosis Date   Atrial flutter ([place])    s/p ablation   DVT (deep venous thrombosis) ([place])    Near syncope 09/25/2013   Paroxysmal atrial fibrillation ([place])    Stroke [facility])    initially hemorragic (not on [place]) followed by subsequent embolic stroke   Past Surgical History:  Procedure Laterality Date   A-FLUTTER ABLATION N/A 03/09/2020   Procedure: A-FLUTTER ABLATION;  Surgeon: Thompson Grayer, MD;  Location: [place] [facility];  Service: Cardiovascular;  Laterality: N/A;   CARDIOVERSION N/A 09/27/2013   Procedure: CARDIOVERSION;  Surgeon: Lelon Perla, MD;  Location: [facility] ENDOSCOPY;  Service: Cardiovascular;  Laterality: N/A;   IR ANGIO EXTERNAL CAROTID SEL EXT CAROTID UNI R MOD SED  09/26/2020   IR ANGIO INTRA EXTRACRAN SEL COM CAROTID INNOMINATE BILAT MOD SED  09/26/2020   IR ANGIO VERTEBRAL SEL VERTEBRAL UNI R MOD SED  09/26/2020   IR IVC FILTER PLMT / S&I /IMG GUID/MOD SED  10/08/2020   IR IVC FILTER RETRIEVAL / S&I /IMG GUID/MOD SED  09/28/2021   IR IVUS EACH ADDITIONAL NON CORONARY VESSEL  11/02/2020   IR PTA VENOUS EXCEPT DIALYSIS CIRCUIT  11/02/2020   IR RADIOLOGIST EVAL & MGMT  01/17/2021   IR RADIOLOGIST EVAL & MGMT  09/12/2021   IR THROMBECT VENO MECH MOD SED  10/27/2020   IR THROMBECT VENO MECH MOD SED  11/02/2020   IR US GUIDE VASC  ACCESS LEFT  10/27/2020   IR US GUIDE VASC ACCESS RIGHT  09/26/2020   IR US GUIDE VASC ACCESS RIGHT  11/02/2020   IR VENO/EXT/UNI RIGHT  11/02/2020   IR VENOCAVAGRAM IVC  10/27/2020   LEFT ATRIAL APPENDAGE OCCLUSION N/A 02/28/2022   Procedure: LEFT ATRIAL APPENDAGE OCCLUSION;  Surgeon: [name], MD;  Location: [place] [facility];  Service: Cardiovascular;  Laterality: N/A;   RETINAL DETACHMENT SURGERY     right knee arthroscopy     TEE WITHOUT CARDIOVERSION N/A 09/27/2013   Procedure: TRANSESOPHAGEAL ECHOCARDIOGRAM (TEE);  Surgeon: Lelon Perla, MD;  Location: [facility] ENDOSCOPY;  Service: Cardiovascular;  Laterality: N/A;   TEE WITHOUT CARDIOVERSION N/A 02/28/2022   Procedure: TRANSESOPHAGEAL ECHOCARDIOGRAM (TEE);  Surgeon: [name], MD;  Location: [place] [facility];  Service: Cardiovascular;  Laterality: N/A;   WRIST SURGERY     Patient Active Problem List   Diagnosis Date Noted   Hypertension 08/16/2021   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   Residual cognitive deficit as late effect of stroke 04/19/2021   Hemiparesis affecting right side as late effect of stroke ([place]) 04/19/2021   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 04/19/2021   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 ([place])  04/19/2021   Thrombosis    Sleep disturbance    Dysphagia, post-stroke    PAF (paroxysmal atrial fibrillation) (HCC)    Acute pulmonary embolism without acute cor pulmonale (HCC)    Malnutrition of moderate degree 11/04/2020   Tracheostomy care (HCC)    Pressure injury of skin 10/23/2020   Intracranial hemorrhage (HCC)    Atrial fibrillation (HCC)    Prediabetes    Leukocytosis    Acute blood loss anemia    Hypernatremia    ICH (intracerebral hemorrhage) (HCC) 09/25/2020   Unilateral primary osteoarthritis, left knee 09/26/2016   Atrial flutter (HCC) 09/25/2013   Near syncope 09/25/2013    ONSET DATE: 09-25-20  REFERRING DIAG: I62.9 (ICD-10-CM) - h/o Intracranial  hemorrhage (HCC)   THERAPY DIAG:  Unsteadiness on feet  Other lack of coordination  Other abnormalities of gait and mobility  Rationale for Evaluation and Treatment Rehabilitation  SUBJECTIVE:                                                                                                                                                                                              SUBJECTIVE STATEMENT: Pt presents for aquatic therapy at [facility] - pt states the procedure for the Watchman implant was aborted due to finding of possible blood clot - pt reports no problems or changes in today's session  Pt accompanied by:  wife  PERTINENT HISTORY: Atrial flutter (HCC)s/p ablation DVT (deep venous thrombosis) (HCC) Near syncope03/28/2015 Paroxysmal atrial fibrillation (HCC) Stroke (HCC)initially hemorragic (not on [place]) followed by subsequent embolic stroke; has Lt hemiataxia and sensory deficits on Rt side of body; also 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 No  PRECAUTIONS: Fall (HIGH fall risk)  WEIGHT BEARING RESTRICTIONS No  FALLS: Has patient fallen in last 6 months? Yes. Number of falls approx. 12 falls   LIVING ENVIRONMENT: Lives with: lives with their spouse Lives in: House/apartment Stairs: Yes: Internal: 12 steps; can reach both;  External 8 steps - bil. But cannot reach both Has following equipment at home: Dan Humphreys - 2 wheeled, Shower bench, and Grab bars,manual wheelchair  Pt states they are building new house with 9" slope differential to enter home - is supposed to be ready in near future  PLOF: Independent prior to CVA in March 2022  PATIENT GOALS Improve gait and  balance   OBJECTIVE:  Patient seen for aquatic therapy today.  Treatment took place in water 3.5-4.8 feet deep depending upon activity.  Pt entered and exited the the pool via step negotiation with use of bilateral hand rails with  CGA to using step by step sequence.  Pt  performed water walking for warm up - forwards, backwards and sideways 18' x 4 reps each direction across width of pool Pt performed marching in place with cues to perform slowly and to tighten core for improved stabilization - 10 reps each leg, holding onto yellow noodle   Stepping strategy for balance - with CGA to min assist for recovery of LOB; forwards, sideways, and backwards 10 reps each LE -   Pt performed jogging in 4'8" water depth with CGA to SBA for  balance - 18' x 2 reps  1/2 jumping jacks 10 reps with CGA to min assist;  lunges 5 reps alternating with use of yellow noodle for support  Cross country skiing - 10 reps with cues for correct sequence for contralateral UE/LE movement  Tall kneeling on pool floor using bench in pool area - 3.6' water depth - pt kneeled on pool floor and used bench for UE support; performed hip abduction 10 reps each leg with assist from PT to stabilize other leg to decrease flotation; - pt performed 5 reps small squats back toward heels for hip strengthening;    Ai Chi posture - "Enclosing" 10 reps with pt standing near pool wall for assist with balance prn    Sit to stand 5 reps from bench in pool in 3.6' water depth - no UE support used - CGA initially, progressing to SBA  Performed water walking at end of session with cues to stand erect and ambulate slowly for increased control - 18' x 2 reps forward without UE support   Pt requires buoyancy of water for support for reduced fall risk with balance exercises in aquatic environment;  exercises able to be performed safely in water compared to that on land with higher fall risk;  current of water provides perturbations for challenges with static & dynamic standing balace.  Pt also requires viscosity of water for strengthening bil. lower extremities and trunk musculature for improved core stabilization.     GOALS: Goals reviewed with patient? Yes  SHORT TERM GOALS: Target date: 03/01/2022  Pt will be  able to perform HEP for balance and core stabilization with assist from family/caregiver as needed for safety. Baseline: dependent Goal status: INITIAL  2.  Sharlene Motts will be performed and LTG updated as appropriate.  Baseline: To be assessed Goal status: INITIAL  3.  Improve TUG score to </= 37 secs with RW with SBA to reduce fall risk. Baseline: 42.25 secs with RW Goal status: INITIAL  4.  Pt will negotiate steps in pool for entering and exiting with use of hand rails with CGA. Baseline: to be assessed Goal status: INITIAL   LONG TERM GOALS: Target date: 03/29/2022  Pt will perform upgraded HEP for balance and coordination exercises and also aquatic exercises with assist from family/caregiver as needed for safety. Baseline:  Goal status: INITIAL  2.  Increase Berg score by at least 4 points to reduce fall risk.  Baseline: To be assessed Goal status: INITIAL  3.  Improve TUG score to </= 32 secs with RW with SBA to reduce fall risk. Baseline: 42.25 secs with RW Goal status: INITIAL  4.  Increase gait velocity to >/= 1.1 ft/sec with use of RW for increased gait efficiency. Baseline: 38.38 secs = .85 ft/sec Goal status: INITIAL  5.  Pt will amb. 10" nonstop in pool in 4' water depth with supervision without LOB for increased endurance/activity tolerance.  Baseline: to  be assessed Goal status: INITIAL  6.  Amb. 115' with RW on flat, even surface with supervision for increased community accessibility.  Baseline:  69' with SBA to CGA  Goal status:  INITIAL  ASSESSMENT:  CLINICAL IMPRESSION: Aquatic therapy session focused on water walking in various directions and standing balance exercises with cues to perform slowly and to tighten core for increased core stabilization and assist with balance.  Balance with water walking was somewhat improved in today's session compared to that in previous sessions - no bar bells or pool noodle were used for UE support with any balance  activities.  Pt did require min to mod assist occasionally for recovery of LOB.  Cont with POC.    OBJECTIVE IMPAIRMENTS Abnormal gait, decreased activity tolerance, decreased balance, decreased coordination, decreased knowledge of use of DME, difficulty walking, decreased strength, impaired sensation, and impaired vision/preception.   ACTIVITY LIMITATIONS carrying, lifting, bending, standing, squatting, stairs, transfers, and dressing  PARTICIPATION LIMITATIONS: meal prep, cleaning, laundry, driving, shopping, and community activity  PERSONAL FACTORS Behavior pattern, Past/current experiences, and Time since onset of injury/illness/exacerbation are also affecting patient's functional outcome.   REHAB POTENTIAL: Fair due to chronicity of deficits & time since onset  CLINICAL DECISION MAKING: Evolving/moderate complexity  EVALUATION COMPLEXITY: Moderate  PLAN: PT FREQUENCY: 2x/week  PT DURATION: 8 weeks  PLANNED INTERVENTIONS: Therapeutic exercises, Therapeutic activity, Neuromuscular re-education, Balance training, Gait training, Patient/Family education, Self Care, Stair training, and Aquatic Therapy  PLAN FOR NEXT SESSION: do Berg; check balance HEP   Seibert Keeter, [place], PT 03/06/2022, 7:28 PM

## 2022-03-07 ENCOUNTER — Encounter: Payer: Self-pay | Admitting: Physical Therapy

## 2022-03-07 ENCOUNTER — Ambulatory Visit: Payer: Medicare Other | Admitting: Physical Therapy

## 2022-03-07 DIAGNOSIS — R278 Other lack of coordination: Secondary | ICD-10-CM

## 2022-03-07 DIAGNOSIS — R2681 Unsteadiness on feet: Secondary | ICD-10-CM | POA: Diagnosis not present

## 2022-03-07 DIAGNOSIS — R2689 Other abnormalities of gait and mobility: Secondary | ICD-10-CM

## 2022-03-07 NOTE — Therapy (Signed)
OUTPATIENT PHYSICAL THERAPY NEURO TREATMENT NOTE   Patient Name: Walter [name] MRN: [id number] DOB:08/02/1955, 66 y.o., male Today's Date: 03/07/2022   PCP: Alroy Dust, L. Marlou Sa, MD REFERRING PROVIDER: Garvin Fila, MD    PT End of Session - 03/07/22 2013     Visit Number 9    Number of Visits 17    Date for PT Re-Evaluation 03/29/22    Authorization Type Medicare    Authorization Time Period 01-29-22 - 03-29-22    PT Start Time 1448    PT Stop Time 1535    PT Time Calculation (min) 47 min    Equipment Utilized During Treatment Gait belt    Activity Tolerance Patient tolerated treatment well    Behavior During Therapy WFL for tasks assessed/performed              Past Medical History:  Diagnosis Date   Atrial flutter ([place])    s/p ablation   DVT (deep venous thrombosis) (HCC)    Near syncope 09/25/2013   Paroxysmal atrial fibrillation ([place])    Stroke [facility])    initially hemorragic (not on [place]) followed by subsequent embolic stroke   Past Surgical History:  Procedure Laterality Date   A-FLUTTER ABLATION N/A 03/09/2020   Procedure: A-FLUTTER ABLATION;  Surgeon: Thompson Grayer, MD;  Location: [place] [facility];  Service: Cardiovascular;  Laterality: N/A;   CARDIOVERSION N/A 09/27/2013   Procedure: CARDIOVERSION;  Surgeon: Lelon Perla, MD;  Location: [facility] ENDOSCOPY;  Service: Cardiovascular;  Laterality: N/A;   IR ANGIO EXTERNAL CAROTID SEL EXT CAROTID UNI R MOD SED  09/26/2020   IR ANGIO INTRA EXTRACRAN SEL COM CAROTID INNOMINATE BILAT MOD SED  09/26/2020   IR ANGIO VERTEBRAL SEL VERTEBRAL UNI R MOD SED  09/26/2020   IR IVC FILTER PLMT / S&I /IMG GUID/MOD SED  10/08/2020   IR IVC FILTER RETRIEVAL / S&I /IMG GUID/MOD SED  09/28/2021   IR IVUS EACH ADDITIONAL NON CORONARY VESSEL  11/02/2020   IR PTA VENOUS EXCEPT DIALYSIS CIRCUIT  11/02/2020   IR RADIOLOGIST EVAL & MGMT  01/17/2021   IR RADIOLOGIST EVAL & MGMT  09/12/2021   IR THROMBECT VENO MECH MOD SED  10/27/2020   IR  THROMBECT VENO MECH MOD SED  11/02/2020   IR US GUIDE VASC ACCESS LEFT  10/27/2020   IR US GUIDE VASC ACCESS RIGHT  09/26/2020   IR US GUIDE VASC ACCESS RIGHT  11/02/2020   IR VENO/EXT/UNI RIGHT  11/02/2020   IR VENOCAVAGRAM IVC  10/27/2020   LEFT ATRIAL APPENDAGE OCCLUSION N/A 02/28/2022   Procedure: LEFT ATRIAL APPENDAGE OCCLUSION;  Surgeon: [name], MD;  Location: [place] [facility];  Service: Cardiovascular;  Laterality: N/A;   RETINAL DETACHMENT SURGERY     right knee arthroscopy     TEE WITHOUT CARDIOVERSION N/A 09/27/2013   Procedure: TRANSESOPHAGEAL ECHOCARDIOGRAM (TEE);  Surgeon: Lelon Perla, MD;  Location: [facility] ENDOSCOPY;  Service: Cardiovascular;  Laterality: N/A;   TEE WITHOUT CARDIOVERSION N/A 02/28/2022   Procedure: TRANSESOPHAGEAL ECHOCARDIOGRAM (TEE);  Surgeon: [name], MD;  Location: [place] [facility];  Service: Cardiovascular;  Laterality: N/A;   WRIST SURGERY     Patient Active Problem List   Diagnosis Date Noted   Hypertension 08/16/2021   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   Residual cognitive deficit as late effect of stroke 04/19/2021   Hemiparesis affecting right side as late effect of stroke ([place]) 04/19/2021   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  04/19/2021   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 ([place]) 04/19/2021   Thrombosis    Sleep disturbance    Dysphagia, post-stroke    PAF (paroxysmal atrial fibrillation) (HCC)    Acute pulmonary embolism without acute cor pulmonale (HCC)    Malnutrition of moderate degree 11/04/2020   Tracheostomy care ([place])    Pressure injury of skin 10/23/2020   Intracranial hemorrhage (HCC)    Atrial fibrillation (HCC)    Prediabetes    Leukocytosis    Acute blood loss anemia    Hypernatremia    ICH (intracerebral hemorrhage) ([place]) 09/25/2020   Unilateral primary osteoarthritis, left knee 09/26/2016   Atrial flutter ([place]) 09/25/2013   Near syncope 09/25/2013    ONSET DATE:  09-25-20  REFERRING DIAG: I62.9 (ICD-10-CM) - h/o Intracranial hemorrhage (HCC)   THERAPY DIAG:  Unsteadiness on feet  Other lack of coordination  Other abnormalities of gait and mobility  Rationale for Evaluation and Treatment Rehabilitation  SUBJECTIVE:                                                                                                                                                                                              SUBJECTIVE STATEMENT: Pt states he had a fall at home last night - states he was transferring from his walker to the wheelchair and lost his balance and fell - states he hit the back of his head on the hardwood floor but did not get hurt; pt using U step RW today  Pt accompanied by:  wife  PERTINENT HISTORY: Atrial flutter (HCC)s/p ablation DVT (deep venous thrombosis) (HCC) Near syncope03/28/2015 Paroxysmal atrial fibrillation (HCC) Stroke (HCC)initially hemorragic (not on [place]) followed by subsequent embolic stroke; has Lt hemiataxia and sensory deficits on Rt side of body; also 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 No  PRECAUTIONS: Fall (HIGH fall risk)  WEIGHT BEARING RESTRICTIONS No  FALLS: Has patient fallen in last 6 months? Yes. Number of falls approx. 12 falls   LIVING ENVIRONMENT: Lives with: lives with their spouse Lives in: House/apartment Stairs: Yes: Internal: 12 steps; can reach both;  External 8 steps - bil. But cannot reach both Has following equipment at home: Gilford Rile - 2 wheeled, Shower bench, and Grab bars,manual wheelchair  Pt states they are building new house with 9" slope differential to enter home - is supposed to be ready in near future  PLOF: Independent prior to CVA in March 2022  PATIENT GOALS Improve gait and  balance   OBJECTIVE:  03-07-22:  Gait:  Pt amb. From lobby to gym  with U step RW with CGA; ataxic gait pattern, approx. 100' from lobby to mat table Gait trained from mat  table to // bars approx. 16' with CGA  Gait training inside // bars with RUE support with mod assist 10' x 4 reps; pt gait trained 10' x 2 reps at end of session without UE support with mod to max assist; pt had 1 occurrence of LLE not advancing, requiring max assist for recovery of LOB    NeuroRe-ed:   Pt transferred from sitting to tall kneeling on floor with min assist Pt performed tall kneeling exercise with RUE support on mat prn; pt lifted each UE to approx. 100 degrees flexion 3 reps unilaterally, then 3 reps bilaterally;  lifting each arm unilaterally to approx. 95 degrees 3 reps each; then bil. UE flexion to 95-100 degrees 3 reps with min to mod assist for balance  Amb. Forward/backward in tall kneeling position on mat on floor 1 rep with min to mod assist for recovery of LOB with LUE support on mat  RLE 1/2 kneeling position with mod to min assist; LLE 1/2 kneeling position with mod to min assist - performed reaching in various directions 3 reps in each 1/2 kneeling position  Quadruped position - pt performed bird dog pose - lifting opposite UE/LE 3 reps with attempted 5 sec hold 3 reps each side - mod to max assist for balance   Standing balance exercises - inside // bars;  stepping strategy exercise - stepping forward/back 5 reps each LE:  stepping out/in 5 reps each leg;  stepping back/forward 5 reps with UE support with cues to perform small steps for increased control and balance  Used black balance beam - pt stepped over beam and then back behind beam 5 reps each leg with bil. UE support 3 reps, then 1 UE support 3 reps each leg       GOALS: Goals reviewed with patient? Yes  SHORT TERM GOALS: Target date: 03/01/2022  Pt will be able to perform HEP for balance and core stabilization with assist from family/caregiver as needed for safety. Baseline: dependent Goal status: Goal met 02-26-22  2.  Merrilee Jansky will be performed and LTG updated as appropriate.  Baseline: To be  assessed Goal status: Deferred  3.  Improve TUG score to </= 37 secs with RW with SBA to reduce fall risk. Baseline: 42.25 secs with RW; 28.22 secs with U step walker with mod assist due to ataxia and decreased balance Goal status: Partially met 03-07-22  4.  Pt will negotiate steps in pool for entering and exiting with use of hand rails with CGA. Baseline: to be assessed Goal status:  Goal met - 02-25-22   LONG TERM GOALS: Target date: 03/29/2022  Pt will perform upgraded HEP for balance and coordination exercises and also aquatic exercises with assist from family/caregiver as needed for safety. Baseline:  Goal status: INITIAL  2.  Increase Berg score by at least 4 points to reduce fall risk.  Baseline: To be assessed Goal status: INITIAL  3.  Improve TUG score to </= 32 secs with RW with SBA to reduce fall risk. Baseline: 42.25 secs with RW Goal status: INITIAL  4.  Increase gait velocity to >/= 1.1 ft/sec with use of RW for increased gait efficiency. Baseline: 38.38 secs = .85 ft/sec Goal status: INITIAL  5.  Pt will amb. 10" nonstop in pool in 4' water depth with supervision without LOB for increased endurance/activity tolerance.  Baseline: to  be assessed Goal status: INITIAL  6.  Amb. 115' with RW on flat, even surface with supervision for increased community accessibility.  Baseline:  85' with SBA to CGA  Goal status:  INITIAL  ASSESSMENT:  CLINICAL IMPRESSION: STG #3 partially met as TUG score 28.22 secs with U step RW but with mod assist for balance as pt performed task with decreased safety and with ataxia in LLE contributing to LOB.  Pt continues to have Lt knee hyperextension in stance.  Cont with POC.  OBJECTIVE IMPAIRMENTS Abnormal gait, decreased activity tolerance, decreased balance, decreased coordination, decreased knowledge of use of DME, difficulty walking, decreased strength, impaired sensation, and impaired vision/preception.   ACTIVITY LIMITATIONS  carrying, lifting, bending, standing, squatting, stairs, transfers, and dressing  PARTICIPATION LIMITATIONS: meal prep, cleaning, laundry, driving, shopping, and community activity  PERSONAL FACTORS Behavior pattern, Past/current experiences, and Time since onset of injury/illness/exacerbation are also affecting patient's functional outcome.   REHAB POTENTIAL: Fair due to chronicity of deficits & time since onset  CLINICAL DECISION MAKING: Evolving/moderate complexity  EVALUATION COMPLEXITY: Moderate  PLAN: PT FREQUENCY: 2x/week  PT DURATION: 8 weeks  PLANNED INTERVENTIONS: Therapeutic exercises, Therapeutic activity, Neuromuscular re-education, Balance training, Gait training, Patient/Family education, Self Care, Stair training, and Aquatic Therapy  PLAN FOR NEXT SESSION: cont balance and gait training   Kodee Drury, [place], PT 03/07/2022, 8:18 PM

## 2022-03-11 ENCOUNTER — Ambulatory Visit: Payer: Medicare Other | Admitting: Physical Therapy

## 2022-03-11 ENCOUNTER — Encounter: Payer: Self-pay | Admitting: Physical Therapy

## 2022-03-11 DIAGNOSIS — R278 Other lack of coordination: Secondary | ICD-10-CM

## 2022-03-11 DIAGNOSIS — R2689 Other abnormalities of gait and mobility: Secondary | ICD-10-CM

## 2022-03-11 DIAGNOSIS — R2681 Unsteadiness on feet: Secondary | ICD-10-CM

## 2022-03-11 NOTE — Therapy (Signed)
OUTPATIENT PHYSICAL THERAPY NEURO TREATMENT NOTE/10th Visit Progress Note   Progress Note Reporting Period 01-29-22 to 03-11-22  See note below for Objective Data and Assessment of Progress/Goals.      Patient Name: Walter [name] MRN: [id number] DOB:[birth year], 66 y.o., male Today's Date: 03/11/2022   PCP: Alroy Dust, L. Marlou Sa, MD REFERRING PROVIDER: Garvin Fila, MD    PT End of Session - 03/11/22 1820     Visit Number 10    Number of Visits 17    Date for PT Re-Evaluation 03/29/22    Authorization Type Medicare    Authorization Time Period 01-29-22 - 03-29-22    PT Start Time 1440    PT Stop Time 1545    PT Time Calculation (min) 65 min    Equipment Utilized During Treatment Other (comment)   barbells, aquatic step   Activity Tolerance Patient tolerated treatment well    Behavior During Therapy [facility] for tasks assessed/performed             Past Medical History:  Diagnosis Date   Atrial flutter ([place])    s/p ablation   DVT (deep venous thrombosis) ([place])    Near syncope 09/25/2013   Paroxysmal atrial fibrillation ([name])    Stroke [facility])    initially hemorragic (not on [place]) followed by subsequent embolic stroke   Past Surgical History:  Procedure Laterality Date   A-FLUTTER ABLATION N/A 03/09/2020   Procedure: A-FLUTTER ABLATION;  Surgeon: Thompson Grayer, MD;  Location: [place] CV LAB;  Service: Cardiovascular;  Laterality: N/A;   CARDIOVERSION N/A 09/27/2013   Procedure: CARDIOVERSION;  Surgeon: Lelon Perla, MD;  Location: [facility] ENDOSCOPY;  Service: Cardiovascular;  Laterality: N/A;   IR ANGIO EXTERNAL CAROTID SEL EXT CAROTID UNI R MOD SED  09/26/2020   IR ANGIO INTRA EXTRACRAN SEL COM CAROTID INNOMINATE BILAT MOD SED  09/26/2020   IR ANGIO VERTEBRAL SEL VERTEBRAL UNI R MOD SED  09/26/2020   IR IVC FILTER PLMT / S&I /IMG GUID/MOD SED  10/08/2020   IR IVC FILTER RETRIEVAL / S&I /IMG GUID/MOD SED  09/28/2021   IR IVUS EACH ADDITIONAL NON CORONARY VESSEL  11/02/2020   IR  PTA VENOUS EXCEPT DIALYSIS CIRCUIT  11/02/2020   IR RADIOLOGIST EVAL & MGMT  01/17/2021   IR RADIOLOGIST EVAL & MGMT  09/12/2021   IR THROMBECT VENO MECH MOD SED  10/27/2020   IR THROMBECT VENO MECH MOD SED  11/02/2020   IR US GUIDE VASC ACCESS LEFT  10/27/2020   IR US GUIDE VASC ACCESS RIGHT  09/26/2020   IR US GUIDE VASC ACCESS RIGHT  11/02/2020   IR VENO/EXT/UNI RIGHT  11/02/2020   IR VENOCAVAGRAM IVC  10/27/2020   LEFT ATRIAL APPENDAGE OCCLUSION N/A 02/28/2022   Procedure: LEFT ATRIAL APPENDAGE OCCLUSION;  Surgeon: [name], MD;  Location: [place] CV LAB;  Service: Cardiovascular;  Laterality: N/A;   RETINAL DETACHMENT SURGERY     right knee arthroscopy     TEE WITHOUT CARDIOVERSION N/A 09/27/2013   Procedure: TRANSESOPHAGEAL ECHOCARDIOGRAM (TEE);  Surgeon: Lelon Perla, MD;  Location: [facility] ENDOSCOPY;  Service: Cardiovascular;  Laterality: N/A;   TEE WITHOUT CARDIOVERSION N/A 02/28/2022   Procedure: TRANSESOPHAGEAL ECHOCARDIOGRAM (TEE);  Surgeon: [name], MD;  Location: [place] CV LAB;  Service: Cardiovascular;  Laterality: N/A;   WRIST SURGERY     Patient Active Problem List   Diagnosis Date Noted   Hypertension 08/16/2021   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   Residual  cognitive deficit as late effect of stroke 04/19/2021   Hemiparesis affecting right side as late effect of stroke ([place]) 04/19/2021   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 04/19/2021   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 ([name]) 04/19/2021   Thrombosis    Sleep disturbance    Dysphagia, post-stroke    PAF (paroxysmal atrial fibrillation) (HCC)    Acute pulmonary embolism without acute cor pulmonale (HCC)    Malnutrition of moderate degree 11/04/2020   Tracheostomy care ([name])    Pressure injury of skin 10/23/2020   Intracranial hemorrhage (HCC)    Atrial fibrillation (HCC)    Prediabetes    Leukocytosis    Acute blood loss anemia    Hypernatremia    ICH  (intracerebral hemorrhage) ([place]) 09/25/2020   Unilateral primary osteoarthritis, left knee 09/26/2016   Atrial flutter ([employer]) 09/25/2013   Near syncope 09/25/2013    ONSET DATE: 09-25-20  REFERRING DIAG: I62.9 (ICD-10-CM) - h/o Intracranial hemorrhage (HCC)   THERAPY DIAG:  Unsteadiness on feet  Other lack of coordination  Other abnormalities of gait and mobility  Rationale for Evaluation and Treatment Rehabilitation  SUBJECTIVE:                                                                                                                                                                                              SUBJECTIVE STATEMENT: Pt presents for aquatic therapy at [facility] - pt reports no changes or issues since previous PT session last Thursday  Pt accompanied by:  wife  PERTINENT HISTORY: Atrial flutter (HCC)s/p ablation DVT (deep venous thrombosis) (HCC) Near syncope03/28/2015 Paroxysmal atrial fibrillation (HCC) Stroke (HCC)initially hemorragic (not on [place]) followed by subsequent embolic stroke; has Lt hemiataxia and sensory deficits on Rt side of body; also 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 No  PRECAUTIONS: Fall (HIGH fall risk)  WEIGHT BEARING RESTRICTIONS No  FALLS: Has patient fallen in last 6 months? Yes. Number of falls approx. 12 falls   LIVING ENVIRONMENT: Lives with: lives with their spouse Lives in: House/apartment Stairs: Yes: Internal: 12 steps; can reach both;  External 8 steps - bil. But cannot reach both Has following equipment at home: Gilford Rile - 2 wheeled, Shower bench, and Grab bars,manual wheelchair  Pt states they are building new house with 9" slope differential to enter home - is supposed to be ready in near future  PLOF: Independent prior to CVA in March 2022  PATIENT GOALS Improve gait and  balance   OBJECTIVE:    Aquatic therapy at [facility] - pool temp 92 degrees  Patient seen for aquatic therapy  today.  Treatment took place in water 3.5-4.8 feet deep depending upon activity.  Pt entered and exited the the pool via step negotiation with use of bilateral hand rails with CGA to SBA using step by step sequence.  Pt performed water walking for warm up - forwards, backwards and sideways 18' x 4 reps each direction across width of pool Pt performed marching in place with cues to perform slowly and to tighten core for improved stabilization - 10 reps each leg, holding onto yellow bar bells; performed marching 18' x 2 reps with contralateral UE movement holding bar bells with marching on contralateral LE with min assist for recovery of balance  Pt performed hip kicks - unilateral LE's with CGA and with use of pool edge prn for assist with balance recovery - forward, back and side kicks 10 reps each direction  Stepping strategy for balance - with CGA to min assist for recovery of LOB; forwards, sideways, and backwards 5 reps each LE -   Pt performed jogging in 4'8" water depth with CGA to SBA for  balance - 18' x 2 reps  1/2 jumping jacks 10 reps with CGA to min assist;  lunges 5 reps alternating with use of yellow noodle for support  Cross country skiing - 10 reps with cues for correct sequence for contralateral UE/LE movement  Tall kneeling on pool floor using bench in pool area - 3.6' water depth - pt kneeled on pool floor and used bench for UE support; performed hip abduction 10 reps each leg with assist from PT to stabilize other leg to decrease flotation; - pt performed 10 reps small squats back toward heels for hip strengthening;  1/2 kneeling on aquatic step on pool floor with CGA - moving bil. UE's forward/back with cues for core stabilization   Sit to stand 5 reps from bench in pool in 3.6' water depth - no UE support used - with SBA  Performed water walking at end of session with cues to stand erect - 18' x 4 reps forward without UE support   Pt requires buoyancy of water for support  for reduced fall risk with balance exercises in aquatic environment;  exercises able to be performed safely in water compared to that on land with higher fall risk;  current of water provides perturbations for challenges with static & dynamic standing balace.  Pt also requires viscosity of water for strengthening bil. lower extremities and trunk musculature for improved core stabilization.     GOALS: Goals reviewed with patient? Yes  SHORT TERM GOALS: Target date: 03/01/2022  Pt will be able to perform HEP for balance and core stabilization with assist from family/caregiver as needed for safety. Baseline: dependent Goal status: Goal status: Goal met 02-26-22   2.  Merrilee Jansky will be performed and LTG updated as appropriate.  Baseline: To be assessed Goal status: Goal deferred 02-25-22  3.  Improve TUG score to </= 37 secs with RW with SBA to reduce fall risk. Baseline: 42.25 secs with RW; 28.22 secs with U step walker with mod assist for stability due to ataxia and decreased balance Goal status: Partially met 03-07-22   4.  Pt will negotiate steps in pool for entering and exiting with use of hand rails with CGA. Baseline: to be assessed Goal status: Goal met 02-25-22   LONG TERM GOALS: Target date: 03/29/2022  Pt will perform upgraded HEP for balance and coordination exercises and also aquatic exercises with  assist from family/caregiver as needed for safety. Baseline:  Goal status: INITIAL  2.  Increase Berg score by at least 4 points to reduce fall risk.  Baseline: To be assessed Goal status: INITIAL  3.  Improve TUG score to </= 32 secs with RW with SBA to reduce fall risk. Baseline: 42.25 secs with RW Goal status: INITIAL  4.  Increase gait velocity to >/= 1.1 ft/sec with use of RW for increased gait efficiency. Baseline: 38.38 secs = .85 ft/sec Goal status: INITIAL  5.  Pt will amb. 10" nonstop in pool in 4' water depth with supervision without LOB for increased  endurance/activity tolerance.  Baseline: to be assessed Goal status: INITIAL  6.  Amb. 115' with RW on flat, even surface with supervision for increased community accessibility.  Baseline:  38' with SBA to CGA  Goal status:  INITIAL  ASSESSMENT:  CLINICAL IMPRESSION: This 10th visit progress note covers dates 01-29-22 - 03-11-22.  Pt is improving with maintaining balance in pool in 4.5' water depth with increased control and core/trunk stabilization noted with water walking, turns and standing balance exercises.  Pt's balance on land is improving slowly with pt using Ustep RW at previous treatment session which provides more stability than the other 2 wheeled RW which he has used in previous treatment sessions.  Pt's Lt knee continues to hyperextend in stance phase of gait with pt having ataxia on Lt side of body.   Cont with POC.    OBJECTIVE IMPAIRMENTS Abnormal gait, decreased activity tolerance, decreased balance, decreased coordination, decreased knowledge of use of DME, difficulty walking, decreased strength, impaired sensation, and impaired vision/preception.   ACTIVITY LIMITATIONS carrying, lifting, bending, standing, squatting, stairs, transfers, and dressing  PARTICIPATION LIMITATIONS: meal prep, cleaning, laundry, driving, shopping, and community activity  PERSONAL FACTORS Behavior pattern, Past/current experiences, and Time since onset of injury/illness/exacerbation are also affecting patient's functional outcome.   REHAB POTENTIAL: Fair due to chronicity of deficits & time since onset  CLINICAL DECISION MAKING: Evolving/moderate complexity  EVALUATION COMPLEXITY: Moderate  PLAN: PT FREQUENCY: 2x/week  PT DURATION: 8 weeks  PLANNED INTERVENTIONS: Therapeutic exercises, Therapeutic activity, Neuromuscular re-education, Balance training, Gait training, Patient/Family education, Self Care, Stair training, and Aquatic Therapy  PLAN FOR NEXT SESSION: continue standing balance  and gait training   Linsay Vogt, [place], PT 03/11/2022, 6:25 PM

## 2022-03-12 ENCOUNTER — Encounter: Payer: Self-pay | Admitting: Physical Therapy

## 2022-03-12 ENCOUNTER — Ambulatory Visit: Payer: Medicare Other | Admitting: Physical Therapy

## 2022-03-12 DIAGNOSIS — R2681 Unsteadiness on feet: Secondary | ICD-10-CM

## 2022-03-12 DIAGNOSIS — R278 Other lack of coordination: Secondary | ICD-10-CM

## 2022-03-12 DIAGNOSIS — R2689 Other abnormalities of gait and mobility: Secondary | ICD-10-CM

## 2022-03-12 NOTE — Therapy (Signed)
OUTPATIENT PHYSICAL THERAPY NEURO TREATMENT NOTE   Patient Name: Walter [name] MRN: [id number] DOB:09/29/1955, 66 y.o., male Today's Date: 03/12/2022   PCP: Alroy Dust, L. Marlou Sa, MD REFERRING PROVIDER: Garvin Fila, MD    PT End of Session - 03/12/22 1855     Visit Number 11    Number of Visits 17    Date for PT Re-Evaluation 03/29/22    Authorization Type Medicare    Authorization Time Period 01-29-22 - 03-29-22    PT Start Time 1144    PT Stop Time 1232    PT Time Calculation (min) 48 min    Equipment Utilized During Treatment Gait belt   barbells, aquatic step   Activity Tolerance Patient tolerated treatment well    Behavior During Therapy WFL for tasks assessed/performed              Past Medical History:  Diagnosis Date   Atrial flutter ([place])    s/p ablation   DVT (deep venous thrombosis) (HCC)    Near syncope 09/25/2013   Paroxysmal atrial fibrillation ([place])    Stroke [facility])    initially hemorragic (not on [place]) followed by subsequent embolic stroke   Past Surgical History:  Procedure Laterality Date   A-FLUTTER ABLATION N/A 03/09/2020   Procedure: A-FLUTTER ABLATION;  Surgeon: Thompson Grayer, MD;  Location: [place] CV LAB;  Service: Cardiovascular;  Laterality: N/A;   CARDIOVERSION N/A 09/27/2013   Procedure: CARDIOVERSION;  Surgeon: Lelon Perla, MD;  Location[facility] ENDOSCOPY;  Service: Cardiovascular;  Laterality: N/A;   IR ANGIO EXTERNAL CAROTID SEL EXT CAROTID UNI R MOD SED  09/26/2020   IR ANGIO INTRA EXTRACRAN SEL COM CAROTID INNOMINATE BILAT MOD SED  09/26/2020   IR ANGIO VERTEBRAL SEL VERTEBRAL UNI R MOD SED  09/26/2020   IR IVC FILTER PLMT / S&I /IMG GUID/MOD SED  10/08/2020   IR IVC FILTER RETRIEVAL / S&I /IMG GUID/MOD SED  09/28/2021   IR IVUS EACH ADDITIONAL NON CORONARY VESSEL  11/02/2020   IR PTA VENOUS EXCEPT DIALYSIS CIRCUIT  11/02/2020   IR RADIOLOGIST EVAL & MGMT  01/17/2021   IR RADIOLOGIST EVAL & MGMT  09/12/2021   IR THROMBECT VENO MECH MOD SED   10/27/2020   IR THROMBECT VENO MECH MOD SED  11/02/2020   IR US GUIDE VASC ACCESS LEFT  10/27/2020   IR US GUIDE VASC ACCESS RIGHT  09/26/2020   IR US GUIDE VASC ACCESS RIGHT  11/02/2020   IR VENO/EXT/UNI RIGHT  11/02/2020   IR VENOCAVAGRAM IVC  10/27/2020   LEFT ATRIAL APPENDAGE OCCLUSION N/A 02/28/2022   Procedure: LEFT ATRIAL APPENDAGE OCCLUSION;  Surgeon: [name], MD;  Location: [place] CV LAB;  Service: Cardiovascular;  Laterality: N/A;   RETINAL DETACHMENT SURGERY     right knee arthroscopy     TEE WITHOUT CARDIOVERSION N/A 09/27/2013   Procedure: TRANSESOPHAGEAL ECHOCARDIOGRAM (TEE);  Surgeon: Lelon Perla, MD;  Location: [facility] ENDOSCOPY;  Service: Cardiovascular;  Laterality: N/A;   TEE WITHOUT CARDIOVERSION N/A 02/28/2022   Procedure: TRANSESOPHAGEAL ECHOCARDIOGRAM (TEE);  Surgeon: [name], MD;  Location: [place] CV LAB;  Service: Cardiovascular;  Laterality: N/A;   WRIST SURGERY     Patient Active Problem List   Diagnosis Date Noted   Hypertension 08/16/2021   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   Residual cognitive deficit as late effect of stroke 04/19/2021   Hemiparesis affecting right side as late effect of stroke ([place]) 04/19/2021   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 04/19/2021   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 (HCC) 04/19/2021   Thrombosis    Sleep disturbance    Dysphagia, post-stroke    PAF (paroxysmal atrial fibrillation) (HCC)    Acute pulmonary embolism without acute cor pulmonale (HCC)    Malnutrition of moderate degree 11/04/2020   Tracheostomy care ([place])    Pressure injury of skin 10/23/2020   Intracranial hemorrhage (HCC)    Atrial fibrillation (HCC)    Prediabetes    Leukocytosis    Acute blood loss anemia    Hypernatremia    ICH (intracerebral hemorrhage) ([place]) 09/25/2020   Unilateral primary osteoarthritis, left knee 09/26/2016   Atrial flutter ([place]) 09/25/2013   Near syncope 09/25/2013     ONSET DATE: 09-25-20  REFERRING DIAG: I62.9 (ICD-10-CM) - h/o Intracranial hemorrhage (HCC)   THERAPY DIAG:  Unsteadiness on feet  Other lack of coordination  Other abnormalities of gait and mobility  Rationale for Evaluation and Treatment Rehabilitation  SUBJECTIVE:                                                                                                                                                                                              SUBJECTIVE STATEMENT: Pt reports he was tired after aquatic therapy session yesterday; reports no other changes since yesterday  Pt accompanied by:  wife  PERTINENT HISTORY: Atrial flutter (HCC)s/p ablation DVT (deep venous thrombosis) (HCC) Near syncope03/28/2015 Paroxysmal atrial fibrillation (HCC) Stroke (HCC)initially hemorragic (not on [place]) followed by subsequent embolic stroke; has Lt hemiataxia and sensory deficits on Rt side of body; also 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 No  PRECAUTIONS: Fall (HIGH fall risk)  WEIGHT BEARING RESTRICTIONS No  FALLS: Has patient fallen in last 6 months? Yes. Number of falls approx. 12 falls   LIVING ENVIRONMENT: Lives with: lives with their spouse Lives in: House/apartment Stairs: Yes: Internal: 12 steps; can reach both;  External 8 steps - bil. But cannot reach both Has following equipment at home: Gilford Rile - 2 wheeled, Shower bench, and Grab bars,manual wheelchair  Pt states they are building new house with 9" slope differential to enter home - is supposed to be ready in near future  PLOF: Independent prior to CVA in March 2022  PATIENT GOALS Improve gait and  balance   OBJECTIVE:  03-12-22:  Gait:  Pt amb. From lobby to gym with U step RW with CGA; ataxic gait pattern, approx. 100' from lobby to mat table Gait trained from mat table to // bars approx. 35' with CGA  Gait training  inside // bars with RUE support with mod assist 10' x 4 reps; pt  gait trained 10' x 4 reps without UE support with mod to max assist; cues to stand erect and amb. Slowly - mod to max assist needed for balance    NeuroRe-ed:  Sitting position - pt performed seated marching 5 reps each leg:  contralateral knee extension with UE flexion 5 sec hold 3 reps each side; progressed to sitting on SitFit for compliant surface training;  min assist for balance - pt performed same exercise - contralateral knee extension with UE flexion: Seated marching on SitFit 3 reps each leg with min assist for balance  Pt transferred from sitting to tall kneeling on floor with min assist Pt performed tall kneeling exercise with LUE support on mat prn; pt lifted each UE to approx. 100 degrees flexion 3 reps unilaterally, then 3 reps bilaterally;  lifting each arm unilaterally to approx. 90 degrees 3 reps each; then bil. UE flexion to 95-100 degrees 3 reps with min to mod assist for balance; pt performed horizontal head turns 5 reps with min to mod assist in tall kneeling position  Amb. Forward/backward in tall kneeling position on mat on floor 2 reps with min to mod assist for recovery of LOB with LUE support on mat  RLE 1/2 kneeling position with mod to min assist; LLE 1/2 kneeling position with mod to min assist - performed reaching in various directions 3 reps in each 1/2 kneeling position  Quadruped position - pt performed bird dog pose - lifting opposite UE/LE 3 reps with attempted 5 sec hold 3 reps each side - mod to max assist for balance   Standing balance exercises - performed inside // bars with UE support; tap ups to 6" step - 3 reps with bil. UE support; 3 reps with unilateral UE support (each hand); no UE support 3 reps each leg with mod to max assist  Lt knee extension control exercise with red theraband inside // bars - 10 reps each - with RLE in bilateral stance, forward & backward stance  Pt stood 2" unsupported at end of session - inside // bars but without UE  support - with SBA     GOALS: Goals reviewed with patient? Yes  SHORT TERM GOALS: Target date: 03/01/2022  Pt will be able to perform HEP for balance and core stabilization with assist from family/caregiver as needed for safety. Baseline: dependent Goal status: Goal met 02-26-22  2.  Merrilee Jansky will be performed and LTG updated as appropriate.  Baseline: To be assessed Goal status: Deferred  3.  Improve TUG score to </= 37 secs with RW with SBA to reduce fall risk. Baseline: 42.25 secs with RW; 28.22 secs with U step walker with mod assist due to ataxia and decreased balance Goal status: Partially met 03-07-22  4.  Pt will negotiate steps in pool for entering and exiting with use of hand rails with CGA. Baseline: to be assessed Goal status:  Goal met - 02-25-22   LONG TERM GOALS: Target date: 03/29/2022  Pt will perform upgraded HEP for balance and coordination exercises and also aquatic exercises with assist from family/caregiver as needed for safety. Baseline:  Goal status: INITIAL  2.  Increase Berg score by at least 4 points to reduce fall risk.  Baseline: To be assessed Goal status: INITIAL  3.  Improve TUG score to </= 32 secs with RW with SBA to reduce fall risk. Baseline: 42.25 secs with RW  Goal status: INITIAL  4.  Increase gait velocity to >/= 1.1 ft/sec with use of RW for increased gait efficiency. Baseline: 38.38 secs = .85 ft/sec Goal status: INITIAL  5.  Pt will amb. 10" nonstop in pool in 4' water depth with supervision without LOB for increased endurance/activity tolerance.  Baseline: to be assessed Goal status: INITIAL  6.  Amb. 115' with RW on flat, even surface with supervision for increased community accessibility.  Baseline:  52' with SBA to CGA  Goal status:  INITIAL  ASSESSMENT:  CLINICAL IMPRESSION: PT session focused on core stabilization exercises in tall kneeling, 1/2 kneeling and quadruped positions and also on standing balance/coordination  exercises with UE support on // bars.  Pt performed gait training with RUE support with mod assist and also without UE support with mod to max assist for balance due to ataxia in LLE.  Pt's bil. LE's were tremoring due to fatigue at end of session with standing unsupported inside // bars.  Cont with POC.  OBJECTIVE IMPAIRMENTS Abnormal gait, decreased activity tolerance, decreased balance, decreased coordination, decreased knowledge of use of DME, difficulty walking, decreased strength, impaired sensation, and impaired vision/preception.   ACTIVITY LIMITATIONS carrying, lifting, bending, standing, squatting, stairs, transfers, and dressing  PARTICIPATION LIMITATIONS: meal prep, cleaning, laundry, driving, shopping, and community activity  PERSONAL FACTORS Behavior pattern, Past/current experiences, and Time since onset of injury/illness/exacerbation are also affecting patient's functional outcome.   REHAB POTENTIAL: Fair due to chronicity of deficits & time since onset  CLINICAL DECISION MAKING: Evolving/moderate complexity  EVALUATION COMPLEXITY: Moderate  PLAN: PT FREQUENCY: 2x/week  PT DURATION: 8 weeks  PLANNED INTERVENTIONS: Therapeutic exercises, Therapeutic activity, Neuromuscular re-education, Balance training, Gait training, Patient/Family education, Self Care, Stair training, and Aquatic Therapy  PLAN FOR NEXT SESSION: cont balance and gait training   Natale Thoma, [place], PT 03/12/2022, 6:59 PM

## 2022-03-18 ENCOUNTER — Ambulatory Visit: Payer: Medicare Other | Admitting: Physical Therapy

## 2022-03-18 DIAGNOSIS — R2681 Unsteadiness on feet: Secondary | ICD-10-CM | POA: Diagnosis not present

## 2022-03-18 DIAGNOSIS — R278 Other lack of coordination: Secondary | ICD-10-CM

## 2022-03-18 DIAGNOSIS — R2689 Other abnormalities of gait and mobility: Secondary | ICD-10-CM

## 2022-03-19 ENCOUNTER — Encounter: Payer: Self-pay | Admitting: Physical Therapy

## 2022-03-19 NOTE — Therapy (Signed)
OUTPATIENT PHYSICAL THERAPY NEURO TREATMENT NOTE    Patient Name: Walter [name] MRN: [id number] DOB:11/21/1955, 66 y.o., male Today's Date: 03/19/2022   PCP: Alroy Dust, L. Marlou Sa, MD REFERRING PROVIDER: Garvin Fila, MD    PT End of Session - 03/19/22 1933     Visit Number 12    Number of Visits 17    Date for PT Re-Evaluation 03/29/22    Authorization Type Medicare    Authorization Time Period 01-29-22 - 03-29-22    PT Start Time 1440    PT Stop Time 1530    PT Time Calculation (min) 50 min    Equipment Utilized During Treatment Other (comment)   barbells, aquatic step   Activity Tolerance Patient tolerated treatment well    Behavior During Therapy [facility] for tasks assessed/performed             Past Medical History:  Diagnosis Date   Atrial flutter ([place])    s/p ablation   DVT (deep venous thrombosis) ([place])    Near syncope 09/25/2013   Paroxysmal atrial fibrillation ([place])    Stroke [facility])    initially hemorragic (not on [place]) followed by subsequent embolic stroke   Past Surgical History:  Procedure Laterality Date   A-FLUTTER ABLATION N/A 03/09/2020   Procedure: A-FLUTTER ABLATION;  Surgeon: Thompson Grayer, MD;  Location: [place] [facility];  Service: Cardiovascular;  Laterality: N/A;   CARDIOVERSION N/A 09/27/2013   Procedure: CARDIOVERSION;  Surgeon: Lelon Perla, MD;  Location: [facility] ENDOSCOPY;  Service: Cardiovascular;  Laterality: N/A;   IR ANGIO EXTERNAL CAROTID SEL EXT CAROTID UNI R MOD SED  09/26/2020   IR ANGIO INTRA EXTRACRAN SEL COM CAROTID INNOMINATE BILAT MOD SED  09/26/2020   IR ANGIO VERTEBRAL SEL VERTEBRAL UNI R MOD SED  09/26/2020   IR IVC FILTER PLMT / S&I /IMG GUID/MOD SED  10/08/2020   IR IVC FILTER RETRIEVAL / S&I /IMG GUID/MOD SED  09/28/2021   IR IVUS EACH ADDITIONAL NON CORONARY VESSEL  11/02/2020   IR PTA VENOUS EXCEPT DIALYSIS CIRCUIT  11/02/2020   IR RADIOLOGIST EVAL & MGMT  01/17/2021   IR RADIOLOGIST EVAL & MGMT  09/12/2021   IR THROMBECT VENO MECH  MOD SED  10/27/2020   IR THROMBECT VENO MECH MOD SED  11/02/2020   IR US GUIDE VASC ACCESS LEFT  10/27/2020   IR US GUIDE VASC ACCESS RIGHT  09/26/2020   IR US GUIDE VASC ACCESS RIGHT  11/02/2020   IR VENO/EXT/UNI RIGHT  11/02/2020   IR VENOCAVAGRAM IVC  10/27/2020   LEFT ATRIAL APPENDAGE OCCLUSION N/A 02/28/2022   Procedure: LEFT ATRIAL APPENDAGE OCCLUSION;  Surgeon: [name], MD;  Location: [place] CV LAB;  Service: Cardiovascular;  Laterality: N/A;   RETINAL DETACHMENT SURGERY     right knee arthroscopy     TEE WITHOUT CARDIOVERSION N/A 09/27/2013   Procedure: TRANSESOPHAGEAL ECHOCARDIOGRAM (TEE);  Surgeon: Lelon Perla, MD;  Location: [facility] ENDOSCOPY;  Service: Cardiovascular;  Laterality: N/A;   TEE WITHOUT CARDIOVERSION N/A 02/28/2022   Procedure: TRANSESOPHAGEAL ECHOCARDIOGRAM (TEE);  Surgeon: [name], MD;  Location: [place] [facility];  Service: Cardiovascular;  Laterality: N/A;   WRIST SURGERY     Patient Active Problem List   Diagnosis Date Noted   Hypertension 08/16/2021   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   Residual cognitive deficit as late effect of stroke 04/19/2021   Hemiparesis affecting right side as late effect of stroke ([place]) 04/19/2021   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 04/19/2021   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 (HCC) 04/19/2021   Thrombosis    Sleep disturbance    Dysphagia, post-stroke    PAF (paroxysmal atrial fibrillation) (HCC)    Acute pulmonary embolism without acute cor pulmonale (HCC)    Malnutrition of moderate degree 11/04/2020   Tracheostomy care ([place])    Pressure injury of skin 10/23/2020   Intracranial hemorrhage (HCC)    Atrial fibrillation (HCC)    Prediabetes    Leukocytosis    Acute blood loss anemia    Hypernatremia    ICH (intracerebral hemorrhage) ([place]) 09/25/2020   Unilateral primary osteoarthritis, left knee 09/26/2016   Atrial flutter ([place]) 09/25/2013   Near syncope 09/25/2013     ONSET DATE: 09-25-20  REFERRING DIAG: I62.9 (ICD-10-CM) - h/o Intracranial hemorrhage (HCC)   THERAPY DIAG:  Unsteadiness on feet  Other lack of coordination  Other abnormalities of gait and mobility  Rationale for Evaluation and Treatment Rehabilitation  SUBJECTIVE:                                                                                                                                                                                              SUBJECTIVE STATEMENT: Pt presents for aquatic therapy at [place] - reports no changes since previous PT session last Tues. 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 No  PRECAUTIONS: Fall (HIGH fall risk)  WEIGHT BEARING RESTRICTIONS No  FALLS: Has patient fallen in last 6 months? Yes. Number of falls approx. 12 falls   LIVING ENVIRONMENT: Lives with: lives with their spouse Lives in: House/apartment Stairs: Yes: Internal: 12 steps; can reach both;  External 8 steps - bil. But cannot reach both Has following equipment at home: [place] Rile - 2 wheeled, Shower bench, and Grab bars,manual wheelchair  Pt states they are building new house with 9" slope differential to enter home - is supposed to be ready in near future  PLOF: Independent prior to CVA in March 2022  PATIENT GOALS Improve gait and  balance   OBJECTIVE:    Aquatic therapy at [place] 90 degrees  Patient seen for aquatic therapy today.  Treatment took place in water 3.5-4.8 feet deep depending upon activity.  Pt entered and exited the the pool via  step negotiation with use of bilateral hand rails  with CGA to SBA using step by step sequence.  Pt performed water walking for warm up - forwards, backwards and sideways 18' x 4 reps each direction across width of pool Pt performed marching in place with cues to perform slowly and to tighten core for improved stabilization - 10 reps each leg - no UE support used; performed marching 18' x 2 reps with contralateral UE movement holding bar bells with marching on contralateral LE with min to mod assist for recovery of balance  Pt performed hip kicks - unilateral LE's with CGA and with use of pool edge prn for assist with balance recovery - forward, back and side kicks 10 reps each direction  Stepping strategy for balance - with CGA to min assist for recovery of LOB; forwards, sideways, and backwards 5 reps each direction with each leg   Pt performed jogging in 4'8" water depth with CGA to SBA for  balance - 18' x 2 reps  1/2 jumping jacks 10 reps with CGA to min assist;  lunges 5 reps alternating with use of yellow noodle for support  Cross country skiing - 10 reps with cues for correct sequence for contralateral UE/LE movement  Tall kneeling on pool floor using bench in pool area - 3.6' water depth - pt kneeled on pool floor and used bench for UE support; performed hip abduction 10 reps each leg with assist from PT to stabilize other leg to decrease flotation; - pt performed 10 reps small squats back toward heels for hip strengthening;  1/2 kneeling on aquatic step on pool floor with CGA - moving bil. UE's forward/back with cues for core stabilization    Pt requires buoyancy of water for support for reduced fall risk with balance exercises in aquatic environment;  exercises able to be performed safely in water compared to that on land with higher fall risk;  current of water provides perturbations for challenges with static & dynamic standing balace.  Pt also requires viscosity of water for strengthening bil. lower extremities and trunk musculature  for improved core stabilization.     GOALS: Goals reviewed with patient? Yes  SHORT TERM GOALS: Target date: 03/01/2022  Pt will be able to perform HEP for balance and core stabilization with assist from family/caregiver as needed for safety. Baseline: dependent Goal status: Goal status: Goal met 02-26-22   2.  Merrilee Jansky will be performed and LTG updated as appropriate.  Baseline: To be assessed Goal status: Goal deferred 02-25-22  3.  Improve TUG score to </= 37 secs with RW with SBA to reduce fall risk. Baseline: 42.25 secs with RW; 28.22 secs with U step walker with mod assist for stability due to ataxia and decreased balance Goal status: Partially met 03-07-22   4.  Pt will negotiate steps in pool for entering and exiting with use of hand rails with CGA. Baseline: to be assessed Goal status: Goal met 02-25-22   LONG TERM GOALS: Target date: 03/29/2022  Pt will perform upgraded HEP for balance and coordination exercises and also aquatic exercises with assist from family/caregiver as needed for safety. Baseline:  Goal status: INITIAL  2.  Increase Berg score by at least 4 points to reduce fall risk.  Baseline: To be assessed Goal status: INITIAL  3.  Improve TUG score to </= 32 secs with RW with SBA to reduce fall risk. Baseline: 42.25 secs with RW Goal status: INITIAL  4.  Increase gait velocity  to >/= 1.1 ft/sec with use of RW for increased gait efficiency. Baseline: 38.38 secs = .85 ft/sec Goal status: INITIAL  5.  Pt will amb. 10" nonstop in pool in 4' water depth with supervision without LOB for increased endurance/activity tolerance.  Baseline: to be assessed Goal status: INITIAL  6.  Amb. 115' with RW on flat, even surface with supervision for increased community accessibility.  Baseline:  60' with SBA to CGA  Goal status:  INITIAL  ASSESSMENT:S  CLINICAL IMPRESSION:  Aquatic therapy session focused on water walking without UE support, balance and coordination  exercises and trunk control exercises performed in tall kneeling position.  Frequent verbal cues to tighten core during exercises requiring SLS helped to improve stability as pt able to perform marching in place with less turning in a circle as occurred in previous aquatic therapy session with this exercise, indicative in increased core stabilization.  Small improvements noted with dynamic standing balance.  Cont with POC.    OBJECTIVE IMPAIRMENTS Abnormal gait, decreased activity tolerance, decreased balance, decreased coordination, decreased knowledge of use of DME, difficulty walking, decreased strength, impaired sensation, and impaired vision/preception.   ACTIVITY LIMITATIONS carrying, lifting, bending, standing, squatting, stairs, transfers, and dressing  PARTICIPATION LIMITATIONS: meal prep, cleaning, laundry, driving, shopping, and community activity  PERSONAL FACTORS Behavior pattern, Past/current experiences, and Time since onset of injury/illness/exacerbation are also affecting patient's functional outcome.   REHAB POTENTIAL: Fair due to chronicity of deficits & time since onset  CLINICAL DECISION MAKING: Evolving/moderate complexity  EVALUATION COMPLEXITY: Moderate  PLAN: PT FREQUENCY: 2x/week  PT DURATION: 8 weeks  PLANNED INTERVENTIONS: Therapeutic exercises, Therapeutic activity, Neuromuscular re-education, Balance training, Gait training, Patient/Family education, Self Care, Stair training, and Aquatic Therapy  PLAN FOR NEXT SESSION: continue standing balance and gait training   Shandrea Lusk, [place], PT 03/19/2022, 7:35 PM

## 2022-03-21 ENCOUNTER — Ambulatory Visit: Payer: Medicare Other | Admitting: Physical Therapy

## 2022-03-21 ENCOUNTER — Encounter: Payer: Self-pay | Admitting: Physical Therapy

## 2022-03-21 DIAGNOSIS — R278 Other lack of coordination: Secondary | ICD-10-CM

## 2022-03-21 DIAGNOSIS — R2689 Other abnormalities of gait and mobility: Secondary | ICD-10-CM

## 2022-03-21 DIAGNOSIS — R2681 Unsteadiness on feet: Secondary | ICD-10-CM

## 2022-03-21 NOTE — Therapy (Signed)
OUTPATIENT PHYSICAL THERAPY NEURO TREATMENT NOTE   Patient Name: Walter [name] MRN: [id number] DOB:[birth year], 66 y.o., male Today's Date: 03/22/2022   PCP: Alroy Dust, L. Marlou Sa, MD REFERRING PROVIDER: Garvin Fila, MD    PT End of Session - 03/21/22 2036     Visit Number 13    Number of Visits 17    Date for PT Re-Evaluation 03/29/22    Authorization Type Medicare    Authorization Time Period 01-29-22 - 03-29-22    PT Start Time 1450    PT Stop Time 1530    PT Time Calculation (min) 40 min    Equipment Utilized During Treatment Gait belt   barbells, aquatic step   Activity Tolerance Patient tolerated treatment well    Behavior During Therapy WFL for tasks assessed/performed               Past Medical History:  Diagnosis Date   Atrial flutter ([place])    s/p ablation   DVT (deep venous thrombosis) (HCC)    Near syncope 09/25/2013   Paroxysmal atrial fibrillation ([place])    Stroke [facility])    initially hemorragic (not on [place]) followed by subsequent embolic stroke   Past Surgical History:  Procedure Laterality Date   A-FLUTTER ABLATION N/A 03/09/2020   Procedure: A-FLUTTER ABLATION;  Surgeon: Thompson Grayer, MD;  Location: [place] CV LAB;  Service: Cardiovascular;  Laterality: N/A;   CARDIOVERSION N/A 09/27/2013   Procedure: CARDIOVERSION;  Surgeon: Lelon Perla, MD;  Location: [facility] ENDOSCOPY;  Service: Cardiovascular;  Laterality: N/A;   IR ANGIO EXTERNAL CAROTID SEL EXT CAROTID UNI R MOD SED  09/26/2020   IR ANGIO INTRA EXTRACRAN SEL COM CAROTID INNOMINATE BILAT MOD SED  09/26/2020   IR ANGIO VERTEBRAL SEL VERTEBRAL UNI R MOD SED  09/26/2020   IR IVC FILTER PLMT / S&I /IMG GUID/MOD SED  10/08/2020   IR IVC FILTER RETRIEVAL / S&I /IMG GUID/MOD SED  09/28/2021   IR IVUS EACH ADDITIONAL NON CORONARY VESSEL  11/02/2020   IR PTA VENOUS EXCEPT DIALYSIS CIRCUIT  11/02/2020   IR RADIOLOGIST EVAL & MGMT  01/17/2021   IR RADIOLOGIST EVAL & MGMT  09/12/2021   IR THROMBECT VENO MECH MOD  SED  10/27/2020   IR THROMBECT VENO MECH MOD SED  11/02/2020   IR US GUIDE VASC ACCESS LEFT  10/27/2020   IR US GUIDE VASC ACCESS RIGHT  09/26/2020   IR US GUIDE VASC ACCESS RIGHT  11/02/2020   IR VENO/EXT/UNI RIGHT  11/02/2020   IR VENOCAVAGRAM IVC  10/27/2020   LEFT ATRIAL APPENDAGE OCCLUSION N/A 02/28/2022   Procedure: LEFT ATRIAL APPENDAGE OCCLUSION;  Surgeon: [name], MD;  Location: [place] CV LAB;  Service: Cardiovascular;  Laterality: N/A;   RETINAL DETACHMENT SURGERY     right knee arthroscopy     TEE WITHOUT CARDIOVERSION N/A 09/27/2013   Procedure: TRANSESOPHAGEAL ECHOCARDIOGRAM (TEE);  Surgeon: Lelon Perla, MD;  Location: [facility] ENDOSCOPY;  Service: Cardiovascular;  Laterality: N/A;   TEE WITHOUT CARDIOVERSION N/A 02/28/2022   Procedure: TRANSESOPHAGEAL ECHOCARDIOGRAM (TEE);  Surgeon: [name], MD;  Location: [place] CV LAB;  Service: Cardiovascular;  Laterality: N/A;   WRIST SURGERY     Patient Active Problem List   Diagnosis Date Noted   Hypertension 08/16/2021   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   Residual cognitive deficit as late effect of stroke 04/19/2021   Hemiparesis affecting right side as late effect of stroke ([place]) 04/19/2021   Ataxia  due to 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 04/19/2021   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 (HCC) 04/19/2021   Thrombosis    Sleep disturbance    Dysphagia, post-stroke    PAF (paroxysmal atrial fibrillation) (HCC)    Acute pulmonary embolism without acute cor pulmonale (HCC)    Malnutrition of moderate degree 11/04/2020   Tracheostomy care ([place])    Pressure injury of skin 10/23/2020   Intracranial hemorrhage (HCC)    Atrial fibrillation (HCC)    Prediabetes    Leukocytosis    Acute blood loss anemia    Hypernatremia    ICH (intracerebral hemorrhage) ([place]) 09/25/2020   Unilateral primary osteoarthritis, left knee 09/26/2016   Atrial flutter ([place]) 09/25/2013   Near syncope 09/25/2013     ONSET DATE: 09-25-20  REFERRING DIAG: I62.9 (ICD-10-CM) - h/o Intracranial hemorrhage (HCC)   THERAPY DIAG:  Unsteadiness on feet  Other abnormalities of gait and mobility  Other lack of coordination  Rationale for Evaluation and Treatment Rehabilitation  SUBJECTIVE:                                                                                                                                                                                              SUBJECTIVE STATEMENT: Pt reports he had a fall in his driveway on Tuesday - states he skinned his Rt elbow but no other injuries;  pt wearing baseball cap with built in hard hat - states his daughter said he had to start wearing this  Pt accompanied by:  wife  PERTINENT HISTORY: Atrial flutter (HCC)s/p ablation DVT (deep venous thrombosis) (HCC) Near syncope03/28/2015 Paroxysmal atrial fibrillation (HCC) Stroke (HCC)initially hemorragic (not on [place]) followed by subsequent embolic stroke; has Lt hemiataxia and sensory deficits on Rt side of body; also 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 No  PRECAUTIONS: Fall (HIGH fall risk)  WEIGHT BEARING RESTRICTIONS No  FALLS: Has patient fallen in last 6 months? Yes. Number of falls approx. 12 falls   LIVING ENVIRONMENT: Lives with: lives with their spouse Lives in: House/apartment Stairs: Yes: Internal: 12 steps; can reach both;  External 8 steps - bil. But cannot reach both Has following equipment at home: [place] Rile - 2 wheeled, Shower bench, and Grab bars,manual wheelchair  Pt states they are building new house with 9" slope differential to enter home - is supposed to be ready in near future  PLOF: Independent prior to CVA in March 2022  PATIENT GOALS Improve gait and  balance   OBJECTIVE:  03-21-22:  Gait:  Pt amb. From lobby to gym with RW with  CGA; ataxic gait pattern, approx. 120' from lobby to mat table on far side of gym Gait trained from mat  table to // bars approx. 37' with CGA  Gait training inside // bars with RUE support with mod assist 10' x 4 reps; pt gait trained 10' x approx. 6 reps without UE support with mod to max assist; cues to stand erect and amb. slowly - mod to max assist needed for balance Pt performed backwards amb. Inside // bars with UE support 1 UE - with use of mirror for visual feedback; CGA to min assist given for balance recovery prn   NeuroRe-ed:  Pt stood 1" unsupported at start of session - inside // bars but no UE support used; performed horizontal head turns 5 reps with CGA; vertical head turns 5 reps with CGA to min assist on 5th rep due to LOB; performed unilateral UE lift to approx. 100 degrees 3 reps each; bil. UE lift 3 reps for trunk stabilization  Stepping strategy  performed inside // bars with 2 finger UE support on // bars - forward, back and side - not alternating - 5 reps each leg; min assist needed occasionally for balance recovery  Stepping over and back of balance beam 5 reps with minimal UE support (2-3 finger support on each hand; 5 reps stepping laterally over beam and back with minimal UE support  Standing balance exercises - performed inside // bars with UE support; tap ups to 4" step - 5 reps with bil. UE support; 5 reps with 2 finger support on each hand; no UE support 3 reps each leg with mod to max assist         GOALS: Goals reviewed with patient? Yes  SHORT TERM GOALS: Target date: 03/01/2022  Pt will be able to perform HEP for balance and core stabilization with assist from family/caregiver as needed for safety. Baseline: dependent Goal status: Goal met 02-26-22  2.  Merrilee Jansky will be performed and LTG updated as appropriate.  Baseline: To be assessed Goal status: Deferred  3.  Improve TUG score to </= 37 secs with RW with SBA to reduce fall risk. Baseline: 42.25 secs with RW; 28.22 secs with U step walker with mod assist due to ataxia and decreased balance Goal  status: Partially met 03-07-22  4.  Pt will negotiate steps in pool for entering and exiting with use of hand rails with CGA. Baseline: to be assessed Goal status:  Goal met - 02-25-22   LONG TERM GOALS: Target date: 03/29/2022  Pt will perform upgraded HEP for balance and coordination exercises and also aquatic exercises with assist from family/caregiver as needed for safety. Baseline:  Goal status: INITIAL  2.  Increase Berg score by at least 4 points to reduce fall risk.  Baseline: To be assessed Goal status: INITIAL  3.  Improve TUG score to </= 32 secs with RW with SBA to reduce fall risk. Baseline: 42.25 secs with RW Goal status: INITIAL  4.  Increase gait velocity to >/= 1.1 ft/sec with use of RW for increased gait efficiency. Baseline: 38.38 secs = .85 ft/sec Goal status: INITIAL  5.  Pt will amb. 10" nonstop in pool in 4' water depth with supervision without LOB for increased endurance/activity tolerance.  Baseline: to be assessed Goal status: INITIAL  6.  Amb. 115' with RW on flat, even surface with supervision for increased community accessibility.  Baseline:  46' with SBA to CGA  Goal status:  INITIAL  ASSESSMENT:  CLINICAL IMPRESSION: PT session focused on gait training inside // bars with decreased UE support and also on standing balance exercises to improve SLS and coordination each leg.  Pt continues to have ataxia RLE and also has decreased balance reactions with pt often requiring verbal cues to reach/grab bar for recovery of LOB to prevent fall.  Pt reported fatigue at end of session. Cont with POC.  OBJECTIVE IMPAIRMENTS Abnormal gait, decreased activity tolerance, decreased balance, decreased coordination, decreased knowledge of use of DME, difficulty walking, decreased strength, impaired sensation, and impaired vision/preception.   ACTIVITY LIMITATIONS carrying, lifting, bending, standing, squatting, stairs, transfers, and dressing  PARTICIPATION  LIMITATIONS: meal prep, cleaning, laundry, driving, shopping, and community activity  PERSONAL FACTORS Behavior pattern, Past/current experiences, and Time since onset of injury/illness/exacerbation are also affecting patient's functional outcome.   REHAB POTENTIAL: Fair due to chronicity of deficits & time since onset  CLINICAL DECISION MAKING: Evolving/moderate complexity  EVALUATION COMPLEXITY: Moderate  PLAN: PT FREQUENCY: 2x/week  PT DURATION: 8 weeks  PLANNED INTERVENTIONS: Therapeutic exercises, Therapeutic activity, Neuromuscular re-education, Balance training, Gait training, Patient/Family education, Self Care, Stair training, and Aquatic Therapy  PLAN FOR NEXT SESSION: cont balance and gait training   Alda Lea, PT 03/22/2022, 12:34 PM

## 2022-03-25 ENCOUNTER — Encounter: Payer: Self-pay | Admitting: Physical Therapy

## 2022-03-25 ENCOUNTER — Ambulatory Visit: Payer: Medicare Other | Admitting: Physical Therapy

## 2022-03-25 DIAGNOSIS — R2681 Unsteadiness on feet: Secondary | ICD-10-CM

## 2022-03-25 DIAGNOSIS — R278 Other lack of coordination: Secondary | ICD-10-CM

## 2022-03-25 DIAGNOSIS — R2689 Other abnormalities of gait and mobility: Secondary | ICD-10-CM

## 2022-03-25 NOTE — Therapy (Signed)
OUTPATIENT PHYSICAL THERAPY NEURO TREATMENT NOTE    Patient Name: Walter [name] MRN: [id number] DOB:[birth year], 66 y.o., male Today's Date: 03/25/2022   PCP: Alroy Dust, L. Marlou Sa, MD REFERRING PROVIDER: Garvin Fila, MD    PT End of Session - 03/25/22 1910     Visit Number 14    Number of Visits 17    Date for PT Re-Evaluation 03/29/22    Authorization Type Medicare    Authorization Time Period 01-29-22 - 03-29-22    PT Start Time 1440    PT Stop Time 1530    PT Time Calculation (min) 50 min    Equipment Utilized During Treatment Other (comment)   barbells, aquatic step, pool noodle   Activity Tolerance Patient tolerated treatment well    Behavior During Therapy [facility] for tasks assessed/performed             Past Medical History:  Diagnosis Date   Atrial flutter ([place])    s/p ablation   DVT (deep venous thrombosis) ([place])    Near syncope 09/25/2013   Paroxysmal atrial fibrillation ([place])    Stroke [facility])    initially hemorragic (not on [place]) followed by subsequent embolic stroke   Past Surgical History:  Procedure Laterality Date   A-FLUTTER ABLATION N/A 03/09/2020   Procedure: A-FLUTTER ABLATION;  Surgeon: Thompson Grayer, MD;  Location: [place] [facility];  Service: Cardiovascular;  Laterality: N/A;   CARDIOVERSION N/A 09/27/2013   Procedure: CARDIOVERSION;  Surgeon: Lelon Perla, MD;  Location: [facility] ENDOSCOPY;  Service: Cardiovascular;  Laterality: N/A;   IR ANGIO EXTERNAL CAROTID SEL EXT CAROTID UNI R MOD SED  09/26/2020   IR ANGIO INTRA EXTRACRAN SEL COM CAROTID INNOMINATE BILAT MOD SED  09/26/2020   IR ANGIO VERTEBRAL SEL VERTEBRAL UNI R MOD SED  09/26/2020   IR IVC FILTER PLMT / S&I /IMG GUID/MOD SED  10/08/2020   IR IVC FILTER RETRIEVAL / S&I /IMG GUID/MOD SED  09/28/2021   IR IVUS EACH ADDITIONAL NON CORONARY VESSEL  11/02/2020   IR PTA VENOUS EXCEPT DIALYSIS CIRCUIT  11/02/2020   IR RADIOLOGIST EVAL & MGMT  01/17/2021   IR RADIOLOGIST EVAL & MGMT  09/12/2021   IR THROMBECT  VENO MECH MOD SED  10/27/2020   IR THROMBECT VENO MECH MOD SED  11/02/2020   IR US GUIDE VASC ACCESS LEFT  10/27/2020   IR US GUIDE VASC ACCESS RIGHT  09/26/2020   IR US GUIDE VASC ACCESS RIGHT  11/02/2020   IR VENO/EXT/UNI RIGHT  11/02/2020   IR VENOCAVAGRAM IVC  10/27/2020   LEFT ATRIAL APPENDAGE OCCLUSION N/A 02/28/2022   Procedure: LEFT ATRIAL APPENDAGE OCCLUSION;  Surgeon: [name], MD;  Location: [place] [facility];  Service: Cardiovascular;  Laterality: N/A;   RETINAL DETACHMENT SURGERY     right knee arthroscopy     TEE WITHOUT CARDIOVERSION N/A 09/27/2013   Procedure: TRANSESOPHAGEAL ECHOCARDIOGRAM (TEE);  Surgeon: Lelon Perla, MD;  Location: [facility] ENDOSCOPY;  Service: Cardiovascular;  Laterality: N/A;   TEE WITHOUT CARDIOVERSION N/A 02/28/2022   Procedure: TRANSESOPHAGEAL ECHOCARDIOGRAM (TEE);  Surgeon: [name], MD;  Location: [place] [facility];  Service: Cardiovascular;  Laterality: N/A;   WRIST SURGERY     Patient Active Problem List   Diagnosis Date Noted   Hypertension 08/16/2021   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   Residual cognitive deficit as late effect of stroke 04/19/2021   Hemiparesis affecting right side as late effect of stroke ([place]) 04/19/2021  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 04/19/2021   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 (HCC) 04/19/2021   Thrombosis    Sleep disturbance    Dysphagia, post-stroke    PAF (paroxysmal atrial fibrillation) (HCC)    Acute pulmonary embolism without acute cor pulmonale (HCC)    Malnutrition of moderate degree 11/04/2020   Tracheostomy care ([place])    Pressure injury of skin 10/23/2020   Intracranial hemorrhage (HCC)    Atrial fibrillation (HCC)    Prediabetes    Leukocytosis    Acute blood loss anemia    Hypernatremia    ICH (intracerebral hemorrhage) ([place]) 09/25/2020   Unilateral primary osteoarthritis, left knee 09/26/2016   Atrial flutter ([place]) 09/25/2013   Near syncope  09/25/2013    ONSET DATE: 09-25-20  REFERRING DIAG: I62.9 (ICD-10-CM) - h/o Intracranial hemorrhage (HCC)   THERAPY DIAG:  Unsteadiness on feet  Other abnormalities of gait and mobility  Other lack of coordination  Rationale for Evaluation and Treatment Rehabilitation  SUBJECTIVE:                                                                                                                                                                                              SUBJECTIVE STATEMENT: Pt presents for aquatic therapy at [place] - no changes reported since previous land PT appt last Thursday, 03-21-22  Pt accompanied by:  wife  PERTINENT HISTORY: Atrial flutter (HCC)s/p ablation DVT (deep venous thrombosis) (HCC) Near syncope03/28/2015 Paroxysmal atrial fibrillation (HCC) Stroke (HCC)initially hemorragic (not on [place]) followed by subsequent embolic stroke; has Lt hemiataxia and sensory deficits on Rt side of body; also 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 No  PRECAUTIONS: Fall (HIGH fall risk)  WEIGHT BEARING RESTRICTIONS No  FALLS: Has patient fallen in last 6 months? Yes. Number of falls approx. 12 falls   LIVING ENVIRONMENT: Lives with: lives with their spouse Lives in: House/apartment Stairs: Yes: Internal: 12 steps; can reach both;  External 8 steps - bil. But cannot reach both Has following equipment at home: Gilford Rile - 2 wheeled, Shower bench, and Grab bars,manual wheelchair  Pt states they are building new house with 9" slope differential to enter home - is supposed to be ready in near future  PLOF: Independent prior to CVA in March 2022  PATIENT GOALS Improve gait and  balance   OBJECTIVE:    Aquatic therapy at [place] 92 degrees  Patient seen for aquatic therapy today.  Treatment took place in water 3.5-4.8 feet deep depending upon activity.  Pt entered and exited the  the pool via step negotiation with use of  bilateral hand rails with CGA to SBA using step by step sequence.  Pt performed water walking for warm up - forwards, backwards and sideways 18' x 4 reps each direction across width of pool Pt performed marching in place with cues to perform slowly and to tighten core for improved stabilization - 10 reps each leg - no UE support used; performed marching 10 reps x 2 sets with contralateral UE movement holding bar bells with marching on contralateral LE with min to mod assist for recovery of balance - cues to tighten core for improved core stabilization  Pt performed hip kicks - unilateral LE's with CGA and with use of pool edge prn for assist with balance recovery - forward, back and side kicks 10 reps each direction  Stepping strategy for balance - with CGA to min assist for recovery of LOB; forwards & sideways 10 reps each direction with each leg   Pt performed tap ups to aquatic step in 4.5' water depth - 10 reps each leg unilaterally, then 5 reps alternating LE's with use of pool edge prn with CGA to min assist  Pt performed jogging (fast walking)  in 4'8" water depth with CGA to SBA for  balance - 18' x 2 reps  1/2 jumping jacks 10 reps with CGA to min assist;  lunges 5 reps alternating with use of yellow noodle for support  Cross country skiing - 10 reps with cues for correct sequence for contralateral UE/LE movement  Tall kneeling on pool floor using bench in pool area - 3.6' water depth - pt kneeled on pool floor and used bench for UE support; performed hip abduction 10 reps each leg with assist from PT to stabilize other leg to decrease flotation; - pt performed 10 reps small squats back toward heels for hip strengthening;  1/2 kneeling on aquatic step on pool floor with CGA - moving bil. UE's forward/back with cues for core stabilization    Pt requires buoyancy of water for support for reduced fall risk with balance exercises in aquatic environment;  exercises able to be performed safely  in water compared to that on land with higher fall risk;  current of water provides perturbations for challenges with static & dynamic standing balace.  Pt also requires viscosity of water for strengthening bil. lower extremities and trunk musculature for improved core stabilization.     GOALS: Goals reviewed with patient? Yes  SHORT TERM GOALS: Target date: 03/01/2022  Pt will be able to perform HEP for balance and core stabilization with assist from family/caregiver as needed for safety. Baseline: dependent Goal status: Goal status: Goal met 02-26-22   2.  Merrilee Jansky will be performed and LTG updated as appropriate.  Baseline: To be assessed Goal status: Goal deferred 02-25-22  3.  Improve TUG score to </= 37 secs with RW with SBA to reduce fall risk. Baseline: 42.25 secs with RW; 28.22 secs with U step walker with mod assist for stability due to ataxia and decreased balance Goal status: Partially met 03-07-22   4.  Pt will negotiate steps in pool for entering and exiting with use of hand rails with CGA. Baseline: to be assessed Goal status: Goal met 02-25-22   LONG TERM GOALS: Target date: 03/29/2022  Pt will perform upgraded HEP for balance and coordination exercises and also aquatic exercises with assist from family/caregiver as needed for safety. Baseline:  Goal status: INITIAL  2.  Increase Berg score  by at least 4 points to reduce fall risk.  Baseline: To be assessed Goal status: INITIAL  3.  Improve TUG score to </= 32 secs with RW with SBA to reduce fall risk. Baseline: 42.25 secs with RW Goal status: INITIAL  4.  Increase gait velocity to >/= 1.1 ft/sec with use of RW for increased gait efficiency. Baseline: 38.38 secs = .85 ft/sec Goal status: INITIAL  5.  Pt will amb. 10" nonstop in pool in 4' water depth with supervision without LOB for increased endurance/activity tolerance.  Baseline: to be assessed Goal status: INITIAL  6.  Amb. 115' with RW on flat, even surface  with supervision for increased community accessibility.  Baseline:  35' with SBA to CGA  Goal status:  INITIAL  ASSESSMENT:S  CLINICAL IMPRESSION:  Aquatic therapy session focused on gait and balance training with cues to decrease speed and tighten core for improved core stabilization.  Pt making improvements with performing exercises slower and with more control with cues to stand erect, move slowly and tighten core muscles.  Improved balance noted in pool with aquatic exercises, but minimal carryover of this progress noted on land with functional mobility.  Cont with POC.    OBJECTIVE IMPAIRMENTS Abnormal gait, decreased activity tolerance, decreased balance, decreased coordination, decreased knowledge of use of DME, difficulty walking, decreased strength, impaired sensation, and impaired vision/preception.   ACTIVITY LIMITATIONS carrying, lifting, bending, standing, squatting, stairs, transfers, and dressing  PARTICIPATION LIMITATIONS: meal prep, cleaning, laundry, driving, shopping, and community activity  PERSONAL FACTORS Behavior pattern, Past/current experiences, and Time since onset of injury/illness/exacerbation are also affecting patient's functional outcome.   REHAB POTENTIAL: Fair due to chronicity of deficits & time since onset  CLINICAL DECISION MAKING: Evolving/moderate complexity  EVALUATION COMPLEXITY: Moderate  PLAN: PT FREQUENCY: 2x/week  PT DURATION: 8 weeks  PLANNED INTERVENTIONS: Therapeutic exercises, Therapeutic activity, Neuromuscular re-education, Balance training, Gait training, Patient/Family education, Self Care, Stair training, and Aquatic Therapy  PLAN FOR NEXT SESSION: continue standing balance and gait training   Amica Harron, [place], PT 03/25/2022, 7:13 PM

## 2022-03-26 ENCOUNTER — Ambulatory Visit: Payer: Medicare Other | Admitting: Physical Therapy

## 2022-03-26 DIAGNOSIS — R2689 Other abnormalities of gait and mobility: Secondary | ICD-10-CM

## 2022-03-26 DIAGNOSIS — R2681 Unsteadiness on feet: Secondary | ICD-10-CM | POA: Diagnosis not present

## 2022-03-26 DIAGNOSIS — R278 Other lack of coordination: Secondary | ICD-10-CM

## 2022-03-26 NOTE — Progress Notes (Unsigned)
[facility]                                     Cardiology Office Note:    Date:  03/27/2022   ID:  Walter [name], DOB 1955-08-16, MRN [id number]  PCP:  Aurea Graff.Marlou Sa, MD  [place] Cardiologist:  None  [facility] Electrophysiologist:  [name], MD   Referring MD: Alroy Dust, Carlean Jews.Marlou Sa, MD   Chief Complaint  Patient presents with   Follow-up    S/p aborted LAAO closure   History of Present Illness:    Walter [name] is a 66 y.o. male with a hx of paroxysmal atrial fibrillation, atrial flutter s/p ablation, DVT, and spontaneous intracranial hemorrhage 08/2020 (not on anticoagulation) and is now s/p cardioembolic CVA and is here today s/p aborted LAA closure due to thrombus on intra-procedure TEE.    Walter [name] was diagnosed with AF 08/2020. He presented to [facility]20 after awaking from sleep with slurred speech and HA. On ED arrival he was intubated due to lethargy. Emergent head CT was performed which showed a left midbrain hemorrhage involving the dorsal part of the pons as well with extension into the cerebral aqueduct and fourth ventricle with local mass-effect on the brainstem but no hydrocephalus. CT angiogram of the head and neck showed no large vessel occlusion or AV malformation. He was started on hypertonic saline and had repeat CT scan which showed worsening hydrocephalus and neurosurgery was consulted who placed a right frontal ventriculostomy catheter. MRI scan of the brain was obtained which confirmed acute hemorrhage within the left dorsal midbrain and pons extending to inferior medial thalamus with intraventricular extension. After EVD placement, he showed neurological improvement. Hospital course was c/b a DVT and PE requiring anticoagulation. He also required a tracheostomy and IVC filter which has since been removed.    He was transferred to to inpatient rehab on 4/28 where he stayed 4 weeks and went home 11/23/20. In last follow up with Dr.  Leonie Man 09/2021, he was felt to be a good candidate for Watchman implant. He was referred for LAAO by Dr. Lovena Le and seen by Dr. Quentin Ore. He was scheduled for LAAO closure 02/28/22.   Unfortunately procedure attempted however case was aborted after echodensity seen on TEE with concern for possible LAAO thrombus. He was restarted on Eliquis with initial plan to follow with a repeat TEE scheduled for 10/4.    He is here today for follow up prior to TEE. He has a lot of concern about needing to potentially undergo two separate TEE procedures. After his stroke and prolonged intubation, he had difficulty with esophageal tightening causing swallowing issues. We discussed that a CT would not give great quality images based on the size of the echodensity. Discussed the case with Dr. Quentin Ore who recommends that he continue anticoagulation and we will schedule him for closure with TEE to be performed on the table day of surgery. They are fully aware of the possibility of needing to cancel once again if persistent thrombus seen. Otherwise he is doing very well. They are in the middle of moving and have twin grandchildren due next week so they are fine to wait some time before scheduling.    Past Medical History:  Diagnosis Date   Atrial flutter [facility])    s/p ablation   DVT (deep venous thrombosis) ([place])    Near syncope 09/25/2013  Paroxysmal atrial fibrillation (HCC)    Stroke [facility])    initially hemorragic (not on OAC) followed by subsequent embolic stroke    Past Surgical History:  Procedure Laterality Date   A-FLUTTER ABLATION N/A 03/09/2020   Procedure: A-FLUTTER ABLATION;  Surgeon: Hillis Range, MD;  [facility] INVASIVE CV LAB;  Service: Cardiovascular;  Laterality: N/A;   CARDIOVERSION N/A 09/27/2013   Procedure: CARDIOVERSION;  Surgeon: Lewayne Bunting, MD;  Location: [facility] ENDOSCOPY;  Service: Cardiovascular;  Laterality: N/A;   IR ANGIO EXTERNAL CAROTID SEL EXT CAROTID UNI R MOD SED  09/26/2020   IR  ANGIO INTRA EXTRACRAN SEL COM CAROTID INNOMINATE BILAT MOD SED  09/26/2020   IR ANGIO VERTEBRAL SEL VERTEBRAL UNI R MOD SED  09/26/2020   IR IVC FILTER PLMT / S&I /IMG GUID/MOD SED  10/08/2020   IR IVC FILTER RETRIEVAL / S&I /IMG GUID/MOD SED  09/28/2021   IR IVUS EACH ADDITIONAL NON CORONARY VESSEL  11/02/2020   IR PTA VENOUS EXCEPT DIALYSIS CIRCUIT  11/02/2020   IR RADIOLOGIST EVAL & MGMT  01/17/2021   IR RADIOLOGIST EVAL & MGMT  09/12/2021   IR THROMBECT VENO MECH MOD SED  10/27/2020   IR THROMBECT VENO MECH MOD SED  11/02/2020   IR US GUIDE VASC ACCESS LEFT  10/27/2020   IR US GUIDE VASC ACCESS RIGHT  09/26/2020   IR US GUIDE VASC ACCESS RIGHT  11/02/2020   IR VENO/EXT/UNI RIGHT  11/02/2020   IR VENOCAVAGRAM IVC  10/27/2020   LEFT ATRIAL APPENDAGE OCCLUSION N/A 02/28/2022   Procedure: LEFT ATRIAL APPENDAGE OCCLUSION;  Surgeon: Lanier Prude, MD;  [facility]: [facility] INVASIVE CV LAB;  Service: Cardiovascular;  Laterality: N/A;   RETINAL DETACHMENT SURGERY     right knee arthroscopy     TEE WITHOUT CARDIOVERSION N/A 09/27/2013   Procedure: TRANSESOPHAGEAL ECHOCARDIOGRAM (TEE);  Surgeon: Lewayne Bunting, MD;  Location: [facility] ENDOSCOPY;  Service: Cardiovascular;  Laterality: N/A;   TEE WITHOUT CARDIOVERSION N/A 02/28/2022   Procedure: TRANSESOPHAGEAL ECHOCARDIOGRAM (TEE);  Surgeon: Lanier Prude, MD;  [facility];  Service: Cardiovascular;  Laterality: N/A;   WRIST SURGERY      Current Medications: Current Meds  Medication Sig   apixaban (ELIQUIS) 5 MG TABS tablet Take 1 tablet (5 mg total) by mouth 2 (two) times daily.   atorvastatin (LIPITOR) 20 MG tablet Take 1 tablet (20 mg total) by mouth at bedtime.   EPINEPHrine 0.3 mg/0.3 mL IJ SOAJ injection Inject 0.3 mg into the muscle as needed for anaphylaxis.   mirtazapine (REMERON) 7.5 MG tablet TAKE 2 TABLETS BY MOUTH AT BEDTIME (Patient taking differently: Take 7.5 mg by mouth at bedtime.)   sertraline (ZOLOFT) 100 MG tablet Take 100 mg  by mouth at bedtime.   Current Facility-Administered Medications for the 03/27/22 encounter (Office Visit) with [facility]  Medication   0.9 %  sodium chloride infusion   0.9 %  sodium chloride infusion     Allergies:   Keppra [levetiracetam] and Latex   Social History   Socioeconomic History   Marital status: Married    Spouse name: cathy   Number of children: Not on file   Years of education: Not on file   Highest education level: Not on file  Occupational History   Not on file  Tobacco Use   Smoking status: Never   Smokeless tobacco: Never  Vaping Use   Vaping Use: Never used  Substance and Sexual Activity  Alcohol use: Yes    Comment: occasionally   Drug use: No   Sexual activity: Not on file  Other Topics Concern   Not on file  Social History Narrative   Lives with wife at home   Right Handed   Drinks decaf   Social Determinants of Health   Financial Resource Strain: Not on file  Food Insecurity: Not on file  Transportation Needs: Not on file  Physical Activity: Not on file  Stress: Not on file  Social Connections: Not on file     Family History: The patient's family history includes Healthy in his brother and sister; Heart disease in his father and mother.  ROS:   Please see the history of present illness.    All other systems reviewed and are negative.  EKGs/Labs/Other Studies Reviewed:    The following studies were reviewed today:  Primary Discharge Diagnosis:  Paroxysmal Atrial Fibrillation Poor candidacy for long term anticoagulation due to h/o intracranial hemorrhage   Secondary Discharge Diagnosis:  -DVT -Atrial fibrillation/atrial flutter -cardio-embolic CVA   Procedures This Admission:   PREPROCEDURE DIAGNOSIS: 1. Atrial fibrillation 2.  History of intracranial hemorrhage   CONCLUSIONS:  1. Aborted watchman implant due to possible left atrial appendage thrombus 2. TEE demonstrating dilated left atrium and  possible left atrial appendage thrombus  3.  Continue OAC.  Transesophageal echo planned for 4 weeks to reassess left atrial appendage.  CTA 01/16/22:   FINDINGS: Mild bi atrial enlargement No ASD/PFO No pericardial effusion. Normal ascending thoracic aorta 3.6 cm   Wind sock type appendage. Landing Zone 21.1 mm with depth 30.2 mm Suitable for a 27 mm FLX device   Working Angle RAO 16 Caudal 13 degrees   Mid inferior trans septal puncture with double curve catheter   LUPV:  Ostium 14.5 mm   area 1.9 cm2   LLPV:   Ostium 18.8 mm  area 1.5 cm2  Elliptical   RUPV:  Ostium 16.1 mm  area 1.8 cm2   RLPV:  Ostium 18 mm  area 2.1 cm2   IMPRESSION: 1.  Mild bi atrial enlargement   2.  No ASD/PFO   3. Wind sock type appendage Landing Zone 21.1 mm with depth 30.2 mm Suitable for a 27 mm FLX device   4.  No LAA thrombus   5.  Normal PV anatomy see measurements above   6.  No pericardial effusion   7.  Normal ascending thoracic aorta 3.6 cm   8.  Coronary calcium score 0  EKG:  EKG is ordered today.    Recent Labs: 08/15/2021: Pro B Natriuretic peptide (BNP) 32.0; TSH 1.92 02/11/2022: BUN 13; Creatinine, Ser 0.98; Hemoglobin 15.1; Platelets 248; Potassium 4.6; Sodium 141   Recent Lipid Panel    Component Value Date/Time   CHOL 155 09/25/2020 1659   TRIG 117 09/25/2020 1659   HDL 44 09/25/2020 1659   CHOLHDL 3.5 09/25/2020 1659   VLDL 23 09/25/2020 1659   LDLCALC 88 09/25/2020 1659   Risk Assessment/Calculations:    HAS-BLED score 4 Hypertension Yes  Abnormal renal and liver function (Dialysis, transplant, Cr >2.26 mg/dL /Cirrhosis or Bilirubin >2x Normal or AST/ALT/AP >3x Normal) No  Stroke Yes  Bleeding Yes  Labile INR (Unstable/high INR) No  Elderly (>65) Yes  Drugs or alcohol (? 8 drinks/week, anti-plt or NSAID) No    CHA2DS2-VASc Score = 4  The patient's score is based upon: CHF History: 0 HTN History: 1 Diabetes History: 0 Stroke History: 2  Vascular  Disease History: 0 Age Score: 1 Gender Score: 0  Physical Exam:    VS:  BP 110/74   Pulse 82   Ht 6' (1.829 m)   Wt 225 lb (102.1 kg)   SpO2 94%   BMI 30.52 kg/m     Wt Readings from Last 3 Encounters:  03/27/22 225 lb (102.1 kg)  02/28/22 225 lb (102.1 kg)  02/11/22 226 lb 6.4 oz (102.7 kg)    General: Well developed, well nourished, NAD Lungs:Clear to ausculation bilaterally. No wheezes, rales, or rhonchi. Breathing is unlabored. Cardiovascular: RRR with S1 S2. No murmurs Extremities: No edema.  Neuro: Alert and oriented.  Psych: Responds to questions appropriately with normal affect.    ASSESSMENT/PLAN:    PAF: Previously scheduled for Watchman however case was aborted due to echodensity see on intra-op TEE. Plan was to perform follow up TEE however he has a lot of concern about needing to potentially undergo two separate TEE procedures. After his stroke and prolonged intubation, he had difficulty with esophageal tightening causing swallowing issues. CT would not give great quality images based on the size of the echodensity. Discussed the case with Dr. Lalla Brothers who recommends that he continue anticoagulation and we will schedule him for closure with TEE to be performed on the table day of surgery. They are fully aware of the possibility of needing to cancel once again if persistent thrombus seen. Otherwise he is doing very well. They are in the middle of moving and have twin grandchildren due next week so they are fine to wait some time before scheduling.    HTN: Stable with no changes needed at this time.    Ischemic hemorraghic stroke: No new neuro changes. Follows closely with Dr. Pearlean Brownie.   Medication Adjustments/Labs and Tests Ordered: Current medicines are reviewed at length with the patient today.  Concerns regarding medicines are outlined above.  No orders of the defined types were placed in this encounter.  No orders of the defined types were placed in this  encounter.   Patient Instructions  Medication Instructions:  Your physician recommends that you continue on your current medications as directed. Please refer to the Current Medication list given to you today.  *If you need a refill on your cardiac medications before your next appointment, please call your pharmacy*   Lab Work: None ordered   If you have labs (blood work) drawn today and your tests are completely normal, you will receive your results only by: MyChart Message (if you have MyChart) OR A paper copy in the mail If you have any lab test that is abnormal or we need to change your treatment, we will call you to review the results.   Testing/Procedures: None ordered    Follow-Up: Follow up as scheduled   Other Instructions   Important Information About Sugar         Signed, Georgie Chard, NP  03/27/2022 1:05 PM    [facility]

## 2022-03-27 ENCOUNTER — Ambulatory Visit: Payer: Medicare Other | Attending: Cardiology | Admitting: Cardiology

## 2022-03-27 ENCOUNTER — Encounter: Payer: Self-pay | Admitting: Physical Therapy

## 2022-03-27 VITALS — BP 110/74 | HR 82 | Ht 72.0 in | Wt 225.0 lb

## 2022-03-27 DIAGNOSIS — Z01818 Encounter for other preprocedural examination: Secondary | ICD-10-CM | POA: Diagnosis present

## 2022-03-27 DIAGNOSIS — E785 Hyperlipidemia, unspecified: Secondary | ICD-10-CM | POA: Insufficient documentation

## 2022-03-27 DIAGNOSIS — I480 Paroxysmal atrial fibrillation: Secondary | ICD-10-CM | POA: Diagnosis present

## 2022-03-27 DIAGNOSIS — I629 Nontraumatic intracranial hemorrhage, unspecified: Secondary | ICD-10-CM | POA: Insufficient documentation

## 2022-03-27 DIAGNOSIS — I8290 Acute embolism and thrombosis of unspecified vein: Secondary | ICD-10-CM | POA: Insufficient documentation

## 2022-03-27 NOTE — Patient Instructions (Signed)

## 2022-03-27 NOTE — Therapy (Signed)
OUTPATIENT PHYSICAL THERAPY NEURO TREATMENT NOTE   Patient Name: Walter [name] MRN: [id number] DOB:Nov 22, 1955, 66 y.o., male Today's Date: 03/27/2022   PCP: Walter [name], L. Walter Sa, MD REFERRING PROVIDER: Garvin Fila, MD    PT End of Session - 03/27/22 0920     Visit Number 15    Number of Visits 17    Date for PT Re-Evaluation 03/29/22    Authorization Type Medicare    Authorization Time Period 01-29-22 - 03-29-22    PT Start Time 1313    PT Stop Time 1401    PT Time Calculation (min) 48 min    Equipment Utilized During Treatment Gait belt   barbells, aquatic step, pool noodle   Activity Tolerance Patient tolerated treatment well    Behavior During Therapy WFL for tasks assessed/performed               Past Medical History:  Diagnosis Date   Atrial flutter ([place])    s/p ablation   DVT (deep venous thrombosis) (HCC)    Near syncope 09/25/2013   Paroxysmal atrial fibrillation ([place])    Stroke [facility])    initially hemorragic (not on [place]) followed by subsequent embolic stroke   Past Surgical History:  Procedure Laterality Date   A-FLUTTER ABLATION N/A 03/09/2020   Procedure: A-FLUTTER ABLATION;  Surgeon: Walter Grayer, MD;  Location: [place] CV LAB;  Service: Cardiovascular;  Laterality: N/A;   CARDIOVERSION N/A 09/27/2013   Procedure: CARDIOVERSION;  Surgeon: Walter Perla, MD;  Location: [facility] ENDOSCOPY;  Service: Cardiovascular;  Laterality: N/A;   IR ANGIO EXTERNAL CAROTID SEL EXT CAROTID UNI R MOD SED  09/26/2020   IR ANGIO INTRA EXTRACRAN SEL COM CAROTID INNOMINATE BILAT MOD SED  09/26/2020   IR ANGIO VERTEBRAL SEL VERTEBRAL UNI R MOD SED  09/26/2020   IR IVC FILTER PLMT / S&I /IMG GUID/MOD SED  10/08/2020   IR IVC FILTER RETRIEVAL / S&I /IMG GUID/MOD SED  09/28/2021   IR IVUS EACH ADDITIONAL NON CORONARY VESSEL  11/02/2020   IR PTA VENOUS EXCEPT DIALYSIS CIRCUIT  11/02/2020   IR RADIOLOGIST EVAL & MGMT  01/17/2021   IR RADIOLOGIST EVAL & MGMT  09/12/2021   IR THROMBECT  VENO MECH MOD SED  10/27/2020   IR THROMBECT VENO MECH MOD SED  11/02/2020   IR US GUIDE VASC ACCESS LEFT  10/27/2020   IR US GUIDE VASC ACCESS RIGHT  09/26/2020   IR US GUIDE VASC ACCESS RIGHT  11/02/2020   IR VENO/EXT/UNI RIGHT  11/02/2020   IR VENOCAVAGRAM IVC  10/27/2020   LEFT ATRIAL APPENDAGE OCCLUSION N/A 02/28/2022   Procedure: LEFT ATRIAL APPENDAGE OCCLUSION;  Surgeon: Walter Epley, MD;  Location: [place] CV LAB;  Service: Cardiovascular;  Laterality: N/A;   RETINAL DETACHMENT SURGERY     right knee arthroscopy     TEE WITHOUT CARDIOVERSION N/A 09/27/2013   Procedure: TRANSESOPHAGEAL ECHOCARDIOGRAM (TEE);  Surgeon: Walter Perla, MD;  Location: [facility] ENDOSCOPY;  Service: Cardiovascular;  Laterality: N/A;   TEE WITHOUT CARDIOVERSION N/A 02/28/2022   Procedure: TRANSESOPHAGEAL ECHOCARDIOGRAM (TEE);  Surgeon: Walter Epley, MD;  Location: [place] CV LAB;  Service: Cardiovascular;  Laterality: N/A;   WRIST SURGERY     Patient Active Problem List   Diagnosis Date Noted   Hypertension 08/16/2021   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   Residual cognitive deficit as late effect of stroke 04/19/2021   Hemiparesis affecting right side as late effect of stroke ([place]) 04/19/2021  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 04/19/2021   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 (HCC) 04/19/2021   Thrombosis    Sleep disturbance    Dysphagia, post-stroke    PAF (paroxysmal atrial fibrillation) (HCC)    Acute pulmonary embolism without acute cor pulmonale (HCC)    Malnutrition of moderate degree 11/04/2020   Tracheostomy care ([place])    Pressure injury of skin 10/23/2020   Intracranial hemorrhage (HCC)    Atrial fibrillation (HCC)    Prediabetes    Leukocytosis    Acute blood loss anemia    Hypernatremia    ICH (intracerebral hemorrhage) ([place]) 09/25/2020   Unilateral primary osteoarthritis, left knee 09/26/2016   Atrial flutter ([place]) 09/25/2013   Near syncope  09/25/2013    ONSET DATE: 09-25-20  REFERRING DIAG: I62.9 (ICD-10-CM) - h/o Intracranial hemorrhage (HCC)   THERAPY DIAG:  Unsteadiness on feet  Other abnormalities of gait and mobility  Other lack of coordination  Rationale for Evaluation and Treatment Rehabilitation  SUBJECTIVE:                                                                                                                                                                                              SUBJECTIVE STATEMENT: Pt reports he had another fall - fell while transitioning from one RW to another at home; states he did not get hurt Pt using EZ foldable RW at today's session  Pt accompanied by:  wife  PERTINENT HISTORY: Atrial flutter (HCC)s/p ablation DVT (deep venous thrombosis) (HCC) Near syncope03/28/2015 Paroxysmal atrial fibrillation (HCC) Stroke (HCC)initially hemorragic (not on [place]) followed by subsequent embolic stroke; has Lt hemiataxia and sensory deficits on Rt side of body; also 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 No  PRECAUTIONS: Fall (HIGH fall risk)  WEIGHT BEARING RESTRICTIONS No  FALLS: Has patient fallen in last 6 months? Yes. Number of falls approx. 12 falls   LIVING ENVIRONMENT: Lives with: lives with their spouse Lives in: House/apartment Stairs: Yes: Internal: 12 steps; can reach both;  External 8 steps - bil. But cannot reach both Has following equipment at home: Gilford Rile - 2 wheeled, Shower bench, and Grab bars,manual wheelchair  Pt states they are building new house with 9" slope differential to enter home - is supposed to be ready in near future  PLOF: Independent prior to CVA in March 2022  PATIENT GOALS Improve gait and  balance   OBJECTIVE:  03-26-22:    Gait:  Pt amb. From lobby to gym with RW with CGA; ataxic gait pattern, approx. 100' from lobby to  middle mat table on far side of gym Gait trained from mat table to // bars approx. 17'  with CGA  Gait training inside // bars with RUE support with mod assist 10' x 6 reps; pt gait trained 10' x approx. 4 reps without UE support with mod to max assist; cues to stand erect and amb. slowly - mod to max assist needed for balance Pt performed backwards amb. Inside // bars with UE support 1 UE - with use of mirror for visual feedback; CGA to min assist given for balance recovery prn   NeuroRe-ed:  Pt stood unsupported  inside // bars -performed unilateral UE lift to approx. 100 degrees 3 reps each; bil. UE lift 3 reps for trunk stabilization With CGA  Stepping strategy  performed inside // bars with 2 finger UE support on // bars - forward, back and side - unilaterally - 5 reps each leg; min assist needed occasionally for balance recovery  Used 2 colored discs for targets - pt performed touching disc straight ahead with each leg for 3 reps with UE support on // bars - 2 finger support, decreasing to 1 UE support with intermittent min/mod assist needed for balance recovery; progressed activity to diagonal touches 3 reps each leg; then added additional disc for 3 total - touching each disc with each leg with UE support with min assist for balance  Ladder (white one) placed inside // bars to have UE support for assist with balance - pt performed ladder negotiation using step over step sequence to facilitate improved SLS, postural control and coordination of each leg with this activity - performed 4 reps inside // bars with min to mod assist  Tap ups to 4" step - straight ahead and then diagonals - block practice with 2-3 finger UE support on // bars with min to mod assist   Performed unilateral marching - 5 reps each leg inside // bars - with min to mod assist for balance recovery          GOALS: Goals reviewed with patient? Yes  SHORT TERM GOALS: Target date: 03/01/2022  Pt will be able to perform HEP for balance and core stabilization with assist from family/caregiver as  needed for safety. Baseline: dependent Goal status: Goal met 02-26-22  2.  Merrilee Jansky will be performed and LTG updated as appropriate.  Baseline: To be assessed Goal status: Deferred  3.  Improve TUG score to </= 37 secs with RW with SBA to reduce fall risk. Baseline: 42.25 secs with RW; 28.22 secs with U step walker with mod assist due to ataxia and decreased balance Goal status: Partially met 03-07-22  4.  Pt will negotiate steps in pool for entering and exiting with use of hand rails with CGA. Baseline: to be assessed Goal status:  Goal met - 02-25-22   LONG TERM GOALS: Target date: 03/29/2022  Pt will perform upgraded HEP for balance and coordination exercises and also aquatic exercises with assist from family/caregiver as needed for safety. Baseline:  Goal status: INITIAL  2.  Increase Berg score by at least 4 points to reduce fall risk.  Baseline: To be assessed Goal status: INITIAL  3.  Improve TUG score to </= 32 secs with RW with SBA to reduce fall risk. Baseline: 42.25 secs with RW Goal status: INITIAL  4.  Increase gait velocity to >/= 1.1 ft/sec with use of RW for increased gait efficiency. Baseline: 38.38 secs = .85 ft/sec Goal status: INITIAL  5.  Pt will amb. 10" nonstop in pool in 4' water depth with supervision without LOB for increased endurance/activity tolerance.  Baseline: to be assessed Goal status: INITIAL  6.  Amb. 115' with RW on flat, even surface with supervision for increased community accessibility.  Baseline:  50' with SBA to CGA  Goal status:  INITIAL  ASSESSMENT:  CLINICAL IMPRESSION: PT session focused on standing balance activities, both static and dynamic, inside // bars with gradual decrease in UE support  with activities as able.  Pt continues to require min to mod assist for balance recovery and has ataxia RLE with Rt genu recurvatum in stance with occasional Rt knee buckling occurring in Rt SLS activities.  Cont with POC.  OBJECTIVE  IMPAIRMENTS Abnormal gait, decreased activity tolerance, decreased balance, decreased coordination, decreased knowledge of use of DME, difficulty walking, decreased strength, impaired sensation, and impaired vision/preception.   ACTIVITY LIMITATIONS carrying, lifting, bending, standing, squatting, stairs, transfers, and dressing  PARTICIPATION LIMITATIONS: meal prep, cleaning, laundry, driving, shopping, and community activity  PERSONAL FACTORS Behavior pattern, Past/current experiences, and Time since onset of injury/illness/exacerbation are also affecting patient's functional outcome.   REHAB POTENTIAL: Fair due to chronicity of deficits & time since onset  CLINICAL DECISION MAKING: Evolving/moderate complexity  EVALUATION COMPLEXITY: Moderate  PLAN: PT FREQUENCY: 2x/week  PT DURATION: 8 weeks  PLANNED INTERVENTIONS: Therapeutic exercises, Therapeutic activity, Neuromuscular re-education, Balance training, Gait training, Patient/Family education, Self Care, Stair training, and Aquatic Therapy  PLAN FOR NEXT SESSION: cont balance and gait training   Leila Schuff, [place], PT 03/27/2022, 9:23 AM

## 2022-04-01 ENCOUNTER — Ambulatory Visit: Payer: Medicare Other | Attending: Family Medicine | Admitting: Physical Therapy

## 2022-04-01 DIAGNOSIS — R278 Other lack of coordination: Secondary | ICD-10-CM | POA: Diagnosis present

## 2022-04-01 DIAGNOSIS — R2689 Other abnormalities of gait and mobility: Secondary | ICD-10-CM | POA: Insufficient documentation

## 2022-04-01 DIAGNOSIS — R2681 Unsteadiness on feet: Secondary | ICD-10-CM | POA: Insufficient documentation

## 2022-04-02 ENCOUNTER — Encounter: Payer: Self-pay | Admitting: Physical Therapy

## 2022-04-02 ENCOUNTER — Ambulatory Visit (HOSPITAL_COMMUNITY): Admission: RE | Admit: 2022-04-02 | Payer: Medicare Other | Source: Home / Self Care | Admitting: Cardiovascular Disease

## 2022-04-02 ENCOUNTER — Encounter (HOSPITAL_COMMUNITY): Admission: RE | Payer: Self-pay | Source: Home / Self Care

## 2022-04-02 SURGERY — ECHOCARDIOGRAM, TRANSESOPHAGEAL
Anesthesia: Monitor Anesthesia Care

## 2022-04-02 NOTE — Therapy (Signed)
OUTPATIENT PHYSICAL THERAPY NEURO TREATMENT NOTE/RECERT    Patient Name: Walter [name] MRN: [id number] DOB:[birth year], 66 y.o., male Today's Date: 04/02/2022   PCP: Alroy Dust, L. Marlou Sa, MD REFERRING PROVIDER: Garvin Fila, MD    PT End of Session - 04/02/22 1848     Visit Number 16    Number of Visits 18    Date for PT Re-Evaluation 04/12/22    Authorization Type Medicare    Authorization Time Period 01-29-22 - 03-29-22; 04-01-22 - 05-02-22    PT Start Time 1445    PT Stop Time 1530    PT Time Calculation (min) 45 min    Equipment Utilized During Treatment Other (comment)   aquatic step   Activity Tolerance Patient tolerated treatment well    Behavior During Therapy [facility] for tasks assessed/performed             Past Medical History:  Diagnosis Date   Atrial flutter ([place])    s/p ablation   DVT (deep venous thrombosis) ([place])    Near syncope 09/25/2013   Paroxysmal atrial fibrillation ([place])    Stroke [facility])    initially hemorragic (not on [place]) followed by subsequent embolic stroke   Past Surgical History:  Procedure Laterality Date   A-FLUTTER ABLATION N/A 03/09/2020   Procedure: A-FLUTTER ABLATION;  Surgeon: Thompson Grayer, MD;  Location: [place] [facility];  Service: Cardiovascular;  Laterality: N/A;   CARDIOVERSION N/A 09/27/2013   Procedure: CARDIOVERSION;  Surgeon: Lelon Perla, MD;  Location: [facility] ENDOSCOPY;  Service: Cardiovascular;  Laterality: N/A;   IR ANGIO EXTERNAL CAROTID SEL EXT CAROTID UNI R MOD SED  09/26/2020   IR ANGIO INTRA EXTRACRAN SEL COM CAROTID INNOMINATE BILAT MOD SED  09/26/2020   IR ANGIO VERTEBRAL SEL VERTEBRAL UNI R MOD SED  09/26/2020   IR IVC FILTER PLMT / S&I /IMG GUID/MOD SED  10/08/2020   IR IVC FILTER RETRIEVAL / S&I /IMG GUID/MOD SED  09/28/2021   IR IVUS EACH ADDITIONAL NON CORONARY VESSEL  11/02/2020   IR PTA VENOUS EXCEPT DIALYSIS CIRCUIT  11/02/2020   IR RADIOLOGIST EVAL & MGMT  01/17/2021   IR RADIOLOGIST EVAL & MGMT  09/12/2021   IR  THROMBECT VENO MECH MOD SED  10/27/2020   IR THROMBECT VENO MECH MOD SED  11/02/2020   IR US GUIDE VASC ACCESS LEFT  10/27/2020   IR US GUIDE VASC ACCESS RIGHT  09/26/2020   IR US GUIDE VASC ACCESS RIGHT  11/02/2020   IR VENO/EXT/UNI RIGHT  11/02/2020   IR VENOCAVAGRAM IVC  10/27/2020   LEFT ATRIAL APPENDAGE OCCLUSION N/A 02/28/2022   Procedure: LEFT ATRIAL APPENDAGE OCCLUSION;  Surgeon: [name], MD;  Location: [place] [facility];  Service: Cardiovascular;  Laterality: N/A;   RETINAL DETACHMENT SURGERY     right knee arthroscopy     TEE WITHOUT CARDIOVERSION N/A 09/27/2013   Procedure: TRANSESOPHAGEAL ECHOCARDIOGRAM (TEE);  Surgeon: Lelon Perla, MD;  Location: [facility] ENDOSCOPY;  Service: Cardiovascular;  Laterality: N/A;   TEE WITHOUT CARDIOVERSION N/A 02/28/2022   Procedure: TRANSESOPHAGEAL ECHOCARDIOGRAM (TEE);  Surgeon: [name], MD;  Location: [place] [facility];  Service: Cardiovascular;  Laterality: N/A;   WRIST SURGERY     Patient Active Problem List   Diagnosis Date Noted   Hypertension 08/16/2021   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   Residual cognitive deficit as late effect of stroke 04/19/2021   Hemiparesis affecting right side as late effect of stroke ([place]) 04/19/2021  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 04/19/2021   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 (HCC) 04/19/2021   Thrombosis    Sleep disturbance    Dysphagia, post-stroke    PAF (paroxysmal atrial fibrillation) (HCC)    Acute pulmonary embolism without acute cor pulmonale (HCC)    Malnutrition of moderate degree 11/04/2020   Tracheostomy care ([place])    Pressure injury of skin 10/23/2020   Intracranial hemorrhage (HCC)    Atrial fibrillation (HCC)    Prediabetes    Leukocytosis    Acute blood loss anemia    Hypernatremia    ICH (intracerebral hemorrhage) ([place]) 09/25/2020   Unilateral primary osteoarthritis, left knee 09/26/2016   Atrial flutter ([place]) 09/25/2013    Near syncope 09/25/2013    ONSET DATE: 09-25-20  REFERRING DIAG: I62.9 (ICD-10-CM) - h/o Intracranial hemorrhage (HCC)   THERAPY DIAG:  Unsteadiness on feet - Plan: PT plan of care cert/re-cert  Other abnormalities of gait and mobility - Plan: PT plan of care cert/re-cert  Other lack of coordination - Plan: PT plan of care cert/re-cert  Rationale for Evaluation and Treatment Rehabilitation  SUBJECTIVE:                                                                                                                                                                                              SUBJECTIVE STATEMENT: Pt presents for aquatic therapy at [place] - no changes reported; in process of moving due to house having sold - waiting on new house to be finished being built  Pt accompanied by:  wife  PERTINENT HISTORY: Atrial flutter (HCC)s/p ablation DVT (deep venous thrombosis) ([place]) Near syncope03/28/2015 Paroxysmal atrial fibrillation (HCC) Stroke (HCC)initially hemorragic (not on [place]) followed by subsequent embolic stroke; has Lt hemiataxia and sensory deficits on Rt side of body; also 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 No  PRECAUTIONS: Fall (HIGH fall risk)  WEIGHT BEARING RESTRICTIONS No  FALLS: Has patient fallen in last 6 months? Yes. Number of falls approx. 12 falls   LIVING ENVIRONMENT: Lives with: lives with their spouse Lives in: House/apartment Stairs: Yes: Internal: 12 steps; can reach both;  External 8 steps - bil. But cannot reach both Has following equipment at home: Gilford Rile - 2 wheeled, Shower bench, and Grab bars,manual wheelchair  Pt states they are building new house with 9" slope differential to enter home - is supposed to be ready in near future  PLOF: Independent prior to CVA in March 2022  PATIENT GOALS Improve gait and  balance   OBJECTIVE:    Aquatic therapy  at [place] 90 degrees  Patient seen for  aquatic therapy today.  Treatment took place in water 3.5-4.8 feet deep depending upon activity.  Pt entered and exited the the pool via step negotiation with use of bilateral hand rails with CGA to SBA using step by step sequence.  Pt performed water walking for warm up - forwards, backwards and sideways 18' x 4 reps each direction across width of pool Pt performed marching in place with cues to perform slowly and to tighten core for improved stabilization - 10 reps each leg - no UE support used  Pt performed hip kicks - unilateral LE's with CGA and with use of pool edge prn for assist with balance recovery - forward, back and side kicks 10 reps each direction  Stepping strategy for balance - with CGA to min assist for recovery of LOB; forwards & sideways 10 reps each direction with each leg   Pt performed jogging (fast walking)  in 4'8" water depth with CGA to SBA for  balance - 18' x 2 reps  Jumping straight up/down 10 reps with CGA but without UE support   Tall kneeling on pool floor using bench in pool area - 3.6' water depth - pt kneeled on pool floor and used bench for UE support; performed hip abduction 10 reps each leg with assist from PT to stabilize other leg to decrease flotation; - pt performed 10 reps small squats back toward heels for hip strengthening;  1/2 kneeling on aquatic step on pool floor with CGA - moving bil. UE's forward/back with cues for core stabilization    Pt requires buoyancy of water for support for reduced fall risk with balance exercises in aquatic environment;  exercises able to be performed safely in water compared to that on land with higher fall risk;  current of water provides perturbations for challenges with static & dynamic standing balace.  Pt also requires viscosity of water for strengthening bil. lower extremities and trunk musculature for improved core stabilization.     GOALS: Goals reviewed with patient? Yes  SHORT TERM GOALS: Target date:  03/01/2022  Pt will be able to perform HEP for balance and core stabilization with assist from family/caregiver as needed for safety. Baseline: dependent Goal status: Goal status: Goal met 02-26-22   2.  Merrilee Jansky will be performed and LTG updated as appropriate.  Baseline: To be assessed Goal status: Goal deferred 02-25-22  3.  Improve TUG score to </= 37 secs with RW with SBA to reduce fall risk. Baseline: 42.25 secs with RW; 28.22 secs with U step walker with mod assist for stability due to ataxia and decreased balance Goal status: Partially met 03-07-22   4.  Pt will negotiate steps in pool for entering and exiting with use of hand rails with CGA. Baseline: to be assessed Goal status: Goal met 02-25-22   LONG TERM GOALS: Target date: 03/29/2022; New target date 04-08-22  Pt will perform upgraded HEP for balance and coordination exercises and also aquatic exercises with assist from family/caregiver as needed for safety. Baseline:  Goal status: ONGOING 03-26-22  2.  Increase Berg score by at least 4 points to reduce fall risk.  Baseline: To be assessed Goal status: Deferred goal  3.  Improve TUG score to </= 32 secs with RW with SBA to reduce fall risk. Baseline: 42.25 secs with RW Goal status: Ongoing 03-26-22  4.  Increase gait velocity to >/= 1.1 ft/sec with use of RW for  increased gait efficiency. Baseline: 38.38 secs = .85 ft/sec Goal status: Ongoing 03-26-22  5.  Pt will amb. 10" nonstop in pool in 4' water depth with supervision without LOB for increased endurance/activity tolerance.  Baseline: to be assessed Goal status: Goal met 04-01-22  6.  Amb. 115' with RW on flat, even surface with supervision for increased community accessibility.  Baseline:  54' with SBA to CGA  Goal status:  Ongoing 03-26-22  ASSESSMENT:S  CLINICAL IMPRESSION:  Aquatic therapy session focused on gait training in 4.5' water depth without UE support and also on static and dynamic standing balance with  CGA to min assist needed for balance recovery.  Pt's balance has improved significantly in pool with minimal carryover noted with activities on land.  Pt able to perform marching in pool today and maintain more static position with less turning of body compared to performance in previous sessions.   Cont with POC.    OBJECTIVE IMPAIRMENTS Abnormal gait, decreased activity tolerance, decreased balance, decreased coordination, decreased knowledge of use of DME, difficulty walking, decreased strength, impaired sensation, and impaired vision/preception.   ACTIVITY LIMITATIONS carrying, lifting, bending, standing, squatting, stairs, transfers, and dressing  PARTICIPATION LIMITATIONS: meal prep, cleaning, laundry, driving, shopping, and community activity  PERSONAL FACTORS Behavior pattern, Past/current experiences, and Time since onset of injury/illness/exacerbation are also affecting patient's functional outcome.   REHAB POTENTIAL: Fair due to chronicity of deficits & time since onset  CLINICAL DECISION MAKING: Evolving/moderate complexity  EVALUATION COMPLEXITY: Moderate  PLAN: PT FREQUENCY: 2x/week  PT DURATION:  2 weeks   PLANNED INTERVENTIONS: Therapeutic exercises, Therapeutic activity, Neuromuscular re-education, Balance training, Gait training, Patient/Family education, Self Care, Stair training, and Aquatic Therapy  PLAN FOR NEXT SESSION: continue standing balance and gait training; continue 1 additional aquatic PT session and 1 land appt   , Jenness Corner, PT 04/02/2022, 7:16 PM

## 2022-04-03 ENCOUNTER — Other Ambulatory Visit: Payer: Self-pay

## 2022-04-03 DIAGNOSIS — I480 Paroxysmal atrial fibrillation: Secondary | ICD-10-CM

## 2022-04-04 ENCOUNTER — Ambulatory Visit: Payer: Medicare Other | Admitting: Physical Therapy

## 2022-04-04 DIAGNOSIS — R2689 Other abnormalities of gait and mobility: Secondary | ICD-10-CM

## 2022-04-04 DIAGNOSIS — R2681 Unsteadiness on feet: Secondary | ICD-10-CM

## 2022-04-04 DIAGNOSIS — R278 Other lack of coordination: Secondary | ICD-10-CM

## 2022-04-05 ENCOUNTER — Encounter: Payer: Self-pay | Admitting: Physical Therapy

## 2022-04-05 NOTE — Therapy (Signed)
OUTPATIENT PHYSICAL THERAPY NEURO TREATMENT NOTE   Patient Name: Walter [name] MRN: [id number] DOB:March 07, 1956, 66 y.o., male Today's Date: 04/05/2022   PCP: Alroy Dust, L. Marlou Sa, MD REFERRING PROVIDER: Garvin Fila, MD    PT End of Session - 04/05/22 1525     Visit Number 17    Number of Visits 18    Date for PT Re-Evaluation 04/12/22    Authorization Type Medicare    Authorization Time Period 01-29-22 - 03-29-22; 04-01-22 - 05-02-22    PT Start Time 1450    PT Stop Time 1532    PT Time Calculation (min) 42 min    Equipment Utilized During Treatment Gait belt   weighted vest with 15#   Activity Tolerance Patient tolerated treatment well    Behavior During Therapy WFL for tasks assessed/performed               Past Medical History:  Diagnosis Date   Atrial flutter ([place])    s/p ablation   DVT (deep venous thrombosis) ([place])    Near syncope 09/25/2013   Paroxysmal atrial fibrillation ([place])    Stroke [facility])    initially hemorragic (not on [place]) followed by subsequent embolic stroke   Past Surgical History:  Procedure Laterality Date   A-FLUTTER ABLATION N/A 03/09/2020   Procedure: A-FLUTTER ABLATION;  Surgeon: Thompson Grayer, MD;  Location: [place] [facility];  Service: Cardiovascular;  Laterality: N/A;   CARDIOVERSION N/A 09/27/2013   Procedure: CARDIOVERSION;  Surgeon: Lelon Perla, MD;  Location: [facility] ENDOSCOPY;  Service: Cardiovascular;  Laterality: N/A;   IR ANGIO EXTERNAL CAROTID SEL EXT CAROTID UNI R MOD SED  09/26/2020   IR ANGIO INTRA EXTRACRAN SEL COM CAROTID INNOMINATE BILAT MOD SED  09/26/2020   IR ANGIO VERTEBRAL SEL VERTEBRAL UNI R MOD SED  09/26/2020   IR IVC FILTER PLMT / S&I /IMG GUID/MOD SED  10/08/2020   IR IVC FILTER RETRIEVAL / S&I /IMG GUID/MOD SED  09/28/2021   IR IVUS EACH ADDITIONAL NON CORONARY VESSEL  11/02/2020   IR PTA VENOUS EXCEPT DIALYSIS CIRCUIT  11/02/2020   IR RADIOLOGIST EVAL & MGMT  01/17/2021   IR RADIOLOGIST EVAL & MGMT  09/12/2021   IR  THROMBECT VENO MECH MOD SED  10/27/2020   IR THROMBECT VENO MECH MOD SED  11/02/2020   IR US GUIDE VASC ACCESS LEFT  10/27/2020   IR US GUIDE VASC ACCESS RIGHT  09/26/2020   IR US GUIDE VASC ACCESS RIGHT  11/02/2020   IR VENO/EXT/UNI RIGHT  11/02/2020   IR VENOCAVAGRAM IVC  10/27/2020   LEFT ATRIAL APPENDAGE OCCLUSION N/A 02/28/2022   Procedure: LEFT ATRIAL APPENDAGE OCCLUSION;  Surgeon: [name], MD;  Location: [place] [facility];  Service: Cardiovascular;  Laterality: N/A;   RETINAL DETACHMENT SURGERY     right knee arthroscopy     TEE WITHOUT CARDIOVERSION N/A 09/27/2013   Procedure: TRANSESOPHAGEAL ECHOCARDIOGRAM (TEE);  Surgeon: Lelon Perla, MD;  Location: [facility] ENDOSCOPY;  Service: Cardiovascular;  Laterality: N/A;   TEE WITHOUT CARDIOVERSION N/A 02/28/2022   Procedure: TRANSESOPHAGEAL ECHOCARDIOGRAM (TEE);  Surgeon: [name], MD;  Location: [place] [facility];  Service: Cardiovascular;  Laterality: N/A;   WRIST SURGERY     Patient Active Problem List   Diagnosis Date Noted   Hypertension 08/16/2021   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   Residual cognitive deficit as late effect of stroke 04/19/2021   Hemiparesis affecting right side as late effect of stroke ([place])  04/19/2021   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 04/19/2021   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 (HCC) 04/19/2021   Thrombosis    Sleep disturbance    Dysphagia, post-stroke    PAF (paroxysmal atrial fibrillation) (HCC)    Acute pulmonary embolism without acute cor pulmonale (HCC)    Malnutrition of moderate degree 11/04/2020   Tracheostomy care ([place])    Pressure injury of skin 10/23/2020   Intracranial hemorrhage (HCC)    Atrial fibrillation (HCC)    Prediabetes    Leukocytosis    Acute blood loss anemia    Hypernatremia    ICH (intracerebral hemorrhage) ([place]) 09/25/2020   Unilateral primary osteoarthritis, left knee 09/26/2016   Atrial flutter ([place]) 09/25/2013    Near syncope 09/25/2013    ONSET DATE: 09-25-20  REFERRING DIAG: I62.9 (ICD-10-CM) - h/o Intracranial hemorrhage (HCC)   THERAPY DIAG:  Other abnormalities of gait and mobility  Other lack of coordination  Unsteadiness on feet  Rationale for Evaluation and Treatment Rehabilitation  SUBJECTIVE:                                                                                                                                                                                              SUBJECTIVE STATEMENT: Pt reports he had another fall - fell while transitioning from one RW to another at home; states he did not get hurt Pt using EZ foldable RW at today's session  Pt accompanied by:  wife  PERTINENT HISTORY: Atrial flutter (HCC)s/p ablation DVT (deep venous thrombosis) (HCC) Near syncope03/28/2015 Paroxysmal atrial fibrillation (HCC) Stroke (HCC)initially hemorragic (not on [place]) followed by subsequent embolic stroke; has Lt hemiataxia and sensory deficits on Rt side of body; also 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 No  PRECAUTIONS: Fall (HIGH fall risk)  WEIGHT BEARING RESTRICTIONS No  FALLS: Has patient fallen in last 6 months? Yes. Number of falls approx. 12 falls   LIVING ENVIRONMENT: Lives with: lives with their spouse Lives in: House/apartment Stairs: Yes: Internal: 12 steps; can reach both;  External 8 steps - bil. But cannot reach both Has following equipment at home: Gilford Rile - 2 wheeled, Shower bench, and Grab bars,manual wheelchair  Pt states they are building new house with 9" slope differential to enter home - is supposed to be ready in near future  PLOF: Independent prior to CVA in March 2022  PATIENT GOALS Improve gait and  balance   OBJECTIVE:  04-04-22:   Gait:  Pt amb. From lobby to gym with RW with CGA with min assist x 1 due  to LOB; ataxic gait pattern, approx. 100' from lobby to middle mat table on far side of gym Gait  trained from mat table to // bars approx. 56' with CGA  Gait training inside // bars with RUE support with mod assist 10' x 4 reps; pt gait trained 10' x approx. 6 reps without UE support with mod to max assist; cues to stand erect and amb. slowly - mod to max assist needed for balance Pt performed backwards amb. Inside // bars with UE support 1 UE - with use of mirror for visual feedback; CGA to min assist given for balance recovery prn; weighted vest with 15# used near end of session - pt gait trained with use of weighted vest inside // bars approx. 10' x 6 reps with 1 UE support initially, progressing to no UE support - varying amount of assistance needed for balance recovery - CGA to mod assist x 1 occurrence due to LLE ataxia   NeuroRe-ed: Sit to stand x 5 reps from high/low mat table with SBA  Pt stood unsupported  inside // bars Stepping strategy  performed inside // bars with 2 finger UE support on // bars - forward, back and side - unilaterally - 5 reps each leg; min assist needed occasionally for balance recovery  Used 2 cones for targets - pt performed touching each cone straight ahead with each leg for 3 reps with UE support on // bars - 2 finger support with intermittent min/mod assist needed for balance recovery; progressed activity to diagonal touches 3 reps each leg  Marching in place 10 reps each leg with 2 finger support on // bars with min to mod assist  Tap ups to 6" step initially inside // bars then to 4" step - straight ahead and then diagonals - block practice with 2-3 finger UE support on // bars with min to mod assist          GOALS: Goals reviewed with patient? Yes  SHORT TERM GOALS: Target date: 03/01/2022  Pt will be able to perform HEP for balance and core stabilization with assist from family/caregiver as needed for safety. Baseline: dependent Goal status: Goal met 02-26-22  2.  Merrilee Jansky will be performed and LTG updated as appropriate.  Baseline: To be  assessed Goal status: Deferred  3.  Improve TUG score to </= 37 secs with RW with SBA to reduce fall risk. Baseline: 42.25 secs with RW; 28.22 secs with U step walker with mod assist due to ataxia and decreased balance Goal status: Partially met 03-07-22  4.  Pt will negotiate steps in pool for entering and exiting with use of hand rails with CGA. Baseline: to be assessed Goal status:  Goal met - 02-25-22   LONG TERM GOALS: Target date: 03/29/2022  Pt will perform upgraded HEP for balance and coordination exercises and also aquatic exercises with assist from family/caregiver as needed for safety. Baseline:  Goal status: Ongoing - 04-04-22  2.  Increase Berg score by at least 4 points to reduce fall risk.  Baseline: To be assessed Goal status: Deferred 04-04-22  3.  Improve TUG score to </= 32 secs with RW with SBA to reduce fall risk. Baseline: 42.25 secs with RW Goal status: Goal partially met - score 28.22 secs with U step walker with min to mod assist due to decreased safety with increased speed  4.  Increase gait velocity to >/= 1.1 ft/sec with use of RW for increased gait efficiency. Baseline: 38.38 secs = .  85 ft/sec Goal status: Goal not met  5.  Pt will amb. 10" nonstop in pool in 4' water depth with supervision without LOB for increased endurance/activity tolerance.  Baseline: to be assessed Goal status: Ongoing  6.  Amb. 115' with RW on flat, even surface with supervision for increased community accessibility.  Baseline:  64' with SBA to CGA  Goal status: Partially met - pt able to amb. 115' with CGA to min assist for recovery of LOB - performance varies depending on fatigue  ASSESSMENT:  CLINICAL IMPRESSION: PT session focused on standing balance exercises to improve SLS and coordination each leg - with bil. UE support on // bars, progressing to less UE support.  Pt has partially met LTG's #3 and 6:  LTG #2 deferred due to minimal improvement in balance on land compared  to significant improvement noted in aquatic environment:  LTG's #1 and 5 are ongoing and LTG #4 not met due to decreased gait speed due to ataxic gait pattern and significantly decreased safety with increased speed.  Cont with POC.  OBJECTIVE IMPAIRMENTS Abnormal gait, decreased activity tolerance, decreased balance, decreased coordination, decreased knowledge of use of DME, difficulty walking, decreased strength, impaired sensation, and impaired vision/preception.   ACTIVITY LIMITATIONS carrying, lifting, bending, standing, squatting, stairs, transfers, and dressing  PARTICIPATION LIMITATIONS: meal prep, cleaning, laundry, driving, shopping, and community activity  PERSONAL FACTORS Behavior pattern, Past/current experiences, and Time since onset of injury/illness/exacerbation are also affecting patient's functional outcome.   REHAB POTENTIAL: Fair due to chronicity of deficits & time since onset  CLINICAL DECISION MAKING: Evolving/moderate complexity  EVALUATION COMPLEXITY: Moderate  PLAN: PT FREQUENCY: 2x/week  PT DURATION: 8 weeks  PLANNED INTERVENTIONS: Therapeutic exercises, Therapeutic activity, Neuromuscular re-education, Balance training, Gait training, Patient/Family education, Self Care, Stair training, and Aquatic Therapy  PLAN FOR NEXT SESSION: cont balance and gait training   Khyler Eschmann, [place], PT 04/05/2022, 3:37 PM

## 2022-04-08 ENCOUNTER — Ambulatory Visit: Payer: Medicare Other | Admitting: Physical Therapy

## 2022-04-08 DIAGNOSIS — R278 Other lack of coordination: Secondary | ICD-10-CM

## 2022-04-08 DIAGNOSIS — R2681 Unsteadiness on feet: Secondary | ICD-10-CM | POA: Diagnosis not present

## 2022-04-08 DIAGNOSIS — R2689 Other abnormalities of gait and mobility: Secondary | ICD-10-CM

## 2022-04-09 ENCOUNTER — Encounter: Payer: Self-pay | Admitting: Physical Therapy

## 2022-04-09 NOTE — Therapy (Signed)
OUTPATIENT PHYSICAL THERAPY NEURO TREATMENT NOTE/DISCHARGE SUMMARY    Patient Name: Walter [name] MRN: [id number] DOB:06/03/1956, 66 y.o., male Today's Date: 04/09/2022   PCP: [name], L. Dean, MD REFERRING PROVIDER: Sethi, Pramod S, MD    PT End of Session - 04/09/22 2004     Visit Number 18    Number of Visits 18    Date for PT Re-Evaluation 04/12/22    Authorization Type Medicare    Authorization Time Period 01-29-22 - 03-29-22; 04-01-22 - 05-02-22    PT Start Time 1445    PT Stop Time 1530    PT Time Calculation (min) 45 min    Equipment Utilized During Treatment Other (comment)   aquatic step, noodle   Activity Tolerance Patient tolerated treatment well    Behavior During Therapy WFL for tasks assessed/performed             Past Medical History:  Diagnosis Date   Atrial flutter (HCC)    s/p ablation   DVT (deep venous thrombosis) (HCC)    Near syncope 09/25/2013   Paroxysmal atrial fibrillation (HCC)    Stroke (HCC)    initially hemorragic (not on OAC) followed by subsequent embolic stroke   Past Surgical History:  Procedure Laterality Date   A-FLUTTER ABLATION N/A 03/09/2020   Procedure: A-FLUTTER ABLATION;  Surgeon: Allred, James, MD;  Location: [facility];  Service: Cardiovascular;  Laterality: N/A;   CARDIOVERSION N/A 09/27/2013   Procedure: CARDIOVERSION;  Surgeon: Brian S Crenshaw, MD;  Location: [facility];  Service: Cardiovascular;  Laterality: N/A;   IR ANGIO EXTERNAL CAROTID SEL EXT CAROTID UNI R MOD SED  09/26/2020   IR ANGIO INTRA EXTRACRAN SEL COM CAROTID INNOMINATE BILAT MOD SED  09/26/2020   IR ANGIO VERTEBRAL SEL VERTEBRAL UNI R MOD SED  09/26/2020   IR IVC FILTER PLMT / S&I /IMG GUID/MOD SED  10/08/2020   IR IVC FILTER RETRIEVAL / S&I /IMG GUID/MOD SED  09/28/2021   IR IVUS EACH ADDITIONAL NON CORONARY VESSEL  11/02/2020   IR PTA VENOUS EXCEPT DIALYSIS CIRCUIT  11/02/2020   IR RADIOLOGIST EVAL & MGMT  01/17/2021   IR RADIOLOGIST EVAL & MGMT   09/12/2021   IR THROMBECT VENO MECH MOD SED  10/27/2020   IR THROMBECT VENO MECH MOD SED  11/02/2020   IR US GUIDE VASC ACCESS LEFT  10/27/2020   IR US GUIDE VASC ACCESS RIGHT  09/26/2020   IR US GUIDE VASC ACCESS RIGHT  11/02/2020   IR VENO/EXT/UNI RIGHT  11/02/2020   IR VENOCAVAGRAM IVC  10/27/2020   LEFT ATRIAL APPENDAGE OCCLUSION N/A 02/28/2022   Procedure: LEFT ATRIAL APPENDAGE OCCLUSION;  Surgeon: Lambert, Cameron T, MD;  [facility];  Service: Cardiovascular;  Laterality: N/A;   RETINAL DETACHMENT SURGERY     right knee arthroscopy     TEE WITHOUT CARDIOVERSION N/A 09/27/2013   Procedure: TRANSESOPHAGEAL ECHOCARDIOGRAM (TEE);  Surgeon: Brian S Crenshaw, MD;  Location: [facility];  Service: Cardiovascular;  Laterality: N/A;   TEE WITHOUT CARDIOVERSION N/A 02/28/2022   Procedure: TRANSESOPHAGEAL ECHOCARDIOGRAM (TEE);  Surgeon: Lambert, Cameron T, MD;  [facility]: [facility];  Service: Cardiovascular;  Laterality: N/A;   WRIST SURGERY     Patient Active Problem List   Diagnosis Date Noted   Hypertension 08/16/2021   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   Residual cognitive deficit as late effect of stroke 04/19/2021   Hemiparesis affecting right side as late effect of stroke (HCC) 04/19/2021     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 04/19/2021   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 (HCC) 04/19/2021   Thrombosis    Sleep disturbance    Dysphagia, post-stroke    PAF (paroxysmal atrial fibrillation) (HCC)    Acute pulmonary embolism without acute cor pulmonale (HCC)    Malnutrition of moderate degree 11/04/2020   Tracheostomy care ([place])    Pressure injury of skin 10/23/2020   Intracranial hemorrhage (HCC)    Atrial fibrillation (HCC)    Prediabetes    Leukocytosis    Acute blood loss anemia    Hypernatremia    ICH (intracerebral hemorrhage) ([place]) 09/25/2020   Unilateral primary osteoarthritis, left knee 09/26/2016   Atrial flutter ([place])  09/25/2013   Near syncope 09/25/2013    ONSET DATE: 09-25-20  REFERRING DIAG: I62.9 (ICD-10-CM) - h/o Intracranial hemorrhage (HCC)   THERAPY DIAG:  Other abnormalities of gait and mobility  Other lack of coordination  Unsteadiness on feet  Rationale for Evaluation and Treatment Rehabilitation  SUBJECTIVE:                                                                                                                                                                                              SUBJECTIVE STATEMENT: Pt presents for aquatic therapy at [place] - no changes or problems reported; last aquatic session due to pt and wife moving to [facility] while waiting on new house to be finished being built  Pt accompanied by:  wife  PERTINENT HISTORY: Atrial flutter (HCC)s/p ablation DVT (deep venous thrombosis) ([place]) Near syncope03/28/2015 Paroxysmal atrial fibrillation (HCC) Stroke (HCC)initially hemorragic (not on [place]) followed by subsequent embolic stroke; has Lt hemiataxia and sensory deficits on Rt side of body; also 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 No  PRECAUTIONS: Fall (HIGH fall risk)  WEIGHT BEARING RESTRICTIONS No  FALLS: Has patient fallen in last 6 months? Yes. Number of falls approx. 12 falls   LIVING ENVIRONMENT: Lives with: lives with their spouse Lives in: House/apartment Stairs: Yes: Internal: 12 steps; can reach both;  External 8 steps - bil. But cannot reach both Has following equipment at home: Gil[employer] Rile - 2 wheeled, Shower bench, and Grab bars,manual wheelchair  Pt states they are building new house with 9" slope differential to enter home - is supposed to be ready in near future  PLOF: Independent prior to CVA in March 2022  PATIENT GOALS Improve gait and  balance   OBJECTIVE:    Aquatic therapy at [place] 90 degrees  Patient seen for aquatic therapy today.  Treatment  took place in water 3.5-4.8 feet  deep depending upon activity.  Pt entered and exited the the pool via step negotiation with use of bilateral hand rails with CGA to SBA using step by step sequence.  Pt performed water walking for warm up - forwards, backwards and sideways 18' x 4 reps each direction across width of pool Pt performed marching in place with cues to perform slowly and to tighten core for improved stabilization - 10 reps each leg - no UE support used  Pt performed hip kicks - unilateral LE's with CGA and with use of pool edge prn for assist with balance recovery - forward, back and side kicks 10 reps each direction   Jumping straight up/down 10 reps with CGA but without UE support   Tall kneeling on pool floor using bench in pool area - 3.6' water depth - pt kneeled on pool floor and used bench for UE support; performed hip abduction 10 reps each leg with assist from PT to stabilize other leg to decrease flotation; - pt performed 10 reps small squats back toward heels for hip strengthening;  1/2 kneeling on aquatic step on pool floor with CGA - moving bil. UE's forward/back with cues for core stabilization    Pt requires buoyancy of water for support for reduced fall risk with balance exercises in aquatic environment;  exercises able to be performed safely in water compared to that on land with higher fall risk;  current of water provides perturbations for challenges with static & dynamic standing balace.  Pt also requires viscosity of water for strengthening bil. lower extremities and trunk musculature for improved core stabilization.     GOALS: Goals reviewed with patient? Yes  SHORT TERM GOALS: Target date: 03/01/2022  Pt will be able to perform HEP for balance and core stabilization with assist from family/caregiver as needed for safety. Baseline: dependent Goal status: Goal status: Goal met 02-26-22   2.  Berg will be performed and LTG updated as appropriate.  Baseline: To be assessed Goal status: Goal  deferred 02-25-22  3.  Improve TUG score to </= 37 secs with RW with SBA to reduce fall risk. Baseline: 42.25 secs with RW; 28.22 secs with U step walker with mod assist for stability due to ataxia and decreased balance Goal status: Partially met 03-07-22   4.  Pt will negotiate steps in pool for entering and exiting with use of hand rails with CGA. Baseline: to be assessed Goal status: Goal met 02-25-22   LONG TERM GOALS: Target date: 03/29/2022; New target date 04-08-22  Pt will perform upgraded HEP for balance and coordination exercises and also aquatic exercises with assist from family/caregiver as needed for safety. Baseline:  Goal status: Partially met:  Pt has been instructed in land HEP; aquatic HEP not yet issued as pt's pool is not yet completed  2.  Increase Berg score by at least 4 points to reduce fall risk.  Baseline: To be assessed Goal status: Deferred goal  3.  Improve TUG score to </= 32 secs with RW with SBA to reduce fall risk. Baseline: 42.25 secs with RW Goal status: Goal not met - not retested due to safety concerns with increased speed  4.  Increase gait velocity to >/= 1.1 ft/sec with use of RW for increased gait efficiency. Baseline: 38.38 secs = .85 ft/sec Goal status: Goal not met - not retested due to safety concerns with increased speed  5.  Pt will amb. 10" nonstop in   pool in 4' water depth with supervision without LOB for increased endurance/activity tolerance.  Baseline: to be assessed Goal status: Goal met 04-01-22  6.  Amb. 115' with RW on flat, even surface with supervision for increased community accessibility.  Baseline:  58' with SBA to CGA  Goal status:  Goal partially met - performance varies depending on fatigue  ASSESSMENT:S  CLINICAL IMPRESSION:  Aquatic therapy session focused on gait training in 4.5' water depth without UE support and also on static and dynamic standing balance with CGA to min assist needed for recovery of LOB in 4.5'  water depth.  Pt has made improvements in balance in the pool but minimal carryover has been noted with performance on land.  Pt remains at very high risk of fall and continues to have ataxic gait pattern.  Pt has met LTG #5 and partially met LTG's #1 and 6:  LTG's #3 and 4 not met due to decreased safety and balance with increased speed.  Pt is discharged due to completion of program and plateau in maximizing functional progress at this time.  Pt is also moving to [facility] at this time due to his current house being built is not yet finished.  Pt states he would like to continue with PT when he returns to the area when house is finished.  D/C at this time.  OBJECTIVE IMPAIRMENTS Abnormal gait, decreased activity tolerance, decreased balance, decreased coordination, decreased knowledge of use of DME, difficulty walking, decreased strength, impaired sensation, and impaired vision/preception.   ACTIVITY LIMITATIONS carrying, lifting, bending, standing, squatting, stairs, transfers, and dressing  PARTICIPATION LIMITATIONS: meal prep, cleaning, laundry, driving, shopping, and community activity  PERSONAL FACTORS Behavior pattern, Past/current experiences, and Time since onset of injury/illness/exacerbation are also affecting patient's functional outcome.   REHAB POTENTIAL: Fair due to chronicity of deficits & time since onset  CLINICAL DECISION MAKING: Evolving/moderate complexity  EVALUATION COMPLEXITY: Moderate  PLAN: PT FREQUENCY: 2x/week  PT DURATION:  2 weeks   PLANNED INTERVENTIONS: Therapeutic exercises, Therapeutic activity, Neuromuscular re-education, Balance training, Gait training, Patient/Family education, Self Care, Stair training, and Aquatic Therapy  PLAN FOR NEXT SESSION: D/C at this time due to program completed and pt moving out of area while waiting on new home to be finished being built   Andreika Vandagriff Suzanne, PT 04/09/2022, 8:10 PM

## 2022-04-16 ENCOUNTER — Encounter: Payer: Self-pay | Admitting: Neurology

## 2022-04-16 ENCOUNTER — Ambulatory Visit (INDEPENDENT_AMBULATORY_CARE_PROVIDER_SITE_OTHER): Payer: Medicare Other | Admitting: Neurology

## 2022-04-16 VITALS — BP 135/81 | HR 90

## 2022-04-16 DIAGNOSIS — I69393 Ataxia following cerebral infarction: Secondary | ICD-10-CM

## 2022-04-16 DIAGNOSIS — R202 Paresthesia of skin: Secondary | ICD-10-CM

## 2022-04-16 DIAGNOSIS — I6990 Unspecified sequelae of unspecified cerebrovascular disease: Secondary | ICD-10-CM

## 2022-04-16 DIAGNOSIS — H532 Diplopia: Secondary | ICD-10-CM | POA: Diagnosis not present

## 2022-04-16 DIAGNOSIS — I639 Cerebral infarction, unspecified: Secondary | ICD-10-CM

## 2022-04-16 MED ORDER — TOPIRAMATE 25 MG PO TABS: 25.0000 mg | ORAL_TABLET | Freq: Two times a day (BID) | ORAL | 3 refills | Status: DC

## 2022-04-16 NOTE — Progress Notes (Signed)
[facility] [address] [place]. [place] [address] [phone]       OFFICE FOLLOW-UP NOTE  [name] Date of Birth:  1955/11/08 Medical Record Number:  [id number]   HPI: Initial visit 12/14/2020: Walter [name] is a 66 year old pleasant Caucasian male seen today for initial office follow-up visit following hospital admission for intracerebral hemorrhage in March 2022.  History is obtained from  patient and his wife and review of electronic medical records and personally reviewed pertinent imaging films in PACS.  He has past medical history of atrial flutter and newly diagnosed A. fib in February 2022 not on anticoagulation who presented on 09/25/2020 upon awakening from sleep with discomfort in his head and ear and slurred speech.  Upon arrival he was drowsy and sleepy with blood pressure in the 140-150 systolic range.  He required emergent intubation due to inability to protect his airway with a Glasgow Coma Scale of 5.  NIH stroke scale was 22.  He was also found to have right-sided weakness.  CT scan showed a left midbrain hemorrhage involving the dorsal part of the pons as well with extension into the cerebral aqueduct and fourth ventricle with local mass-effect on the brainstem but no hydrocephalus.  CT angiogram of the head and neck showed no large vessel occlusion or AV malformation.  ICH score was 3.  Patient was admitted to the intensive care unit.  He was started on hypertonic saline and had repeat CT scan which showed worsening hydrocephalus and neurosurgery was consulted who placed a right frontal ventriculostomy catheter.  MRI scan of the brain was obtained which confirmed acute hemorrhage within the left dorsal midbrain and pons extending to inferior medial thalamus with intraventricular extension.  There is no abnormal enhancement to suggest underlying structural or vascular lesion.  2D echo showed normal ejection fraction.  LDL cholesterol is 88 mg percent.   Hemoglobin A1c was 5.7.  Cerebral catheter angiogram was obtained and 09/27/2018 emergently which showed no evidence of AVM on formation, venous sinus thrombosis vascular abnormalities.  Patient showed neurological improvement after the EVD was placed and was following commands and opening his eyes.  The patient subsequent hospital course was complicated by development of deep vein thrombosis and pulmonary embolism eventually requiring to be started on anticoagulation.  He also required a tracheostomy and placement of IVC filter.  Long-term EEG monitoring showed triphasics but no definite seizure activity.  He was eventually weaned off ventilatory support and transferred to inpatient rehab on 10/26/2020 where he stayed for 4 weeks and made gradual improvement and was discharged home on 11/23/2020.  Patient was initially scheduled to see me in 2 months but the patient's wife had several questions about his follow-up care and I will prompting him to be seen sooner today.  Patient is currently at home he is getting home physical and occupational therapy.  He has significant truncal and hemiataxia and requires 1 person assist to even stand and can walk only using the safety belt and with the therapists.  Spent most of his time in the wheelchair.  His voice is very hypophonic.  He recently saw Dr. Ezzard Standing ENT who did the fiberoptic endoscopy and did not find any significant abnormalities of the vocal cords.  He was complaining of significant fatigue and tiredness and Dr. Wynn Banker recently added Provigil 100 mg in the morning.  Patient did have some placement sleep problems which appear to be more exacerbated in the last 2 weeks since starting Provigil.  He does take trazodone 50 mg at night but its not helping.  Patient has lot of questions about his timing and extent of recovery.  He is also read up on rehab trials as well as hyperbaric oxygen and has questions about using these.  He continues to have significant vertical  diplopia and is wearing an eye patch alternatingly on both eyes constantly.  He has seen Dr. Dione Booze who plans to see him back in July and may prescribe prisms if diplopia persists. Update 04/16/2021 : He returns for follow-up after last visit 4 months ago.  He is accompanied by his wife.  Patient continues to improve with ongoing therapies.  He is now able to ambulate and uses a walker for long distances.  He unfortunately sprained his right ankle and is currently wearing a boot at this has set him back a little bit.  His voice has improved but diplopia persists.  He has seen Dr. Karleen Hampshire neuro-ophthalmologist in [facility] who prescribed prisms but they do not seem to be working.  He is planning to see another ophthalmologist for a second opinion.  He did have repeat MRI scan done on 12/25/2020 which showed expected evolutionary changes in the left posterior midbrain hemorrhage with no under lying features to suggest vascular neoplastic lesion.  There was a tiny enhancing vessel at the bed of the hematoma which is of unclear significance.  There are also tiny chronic infarcts noted in the left frontal, parietal and bilateral cerebellar regions.  Patient has been taking Eliquis for his A. fib and tolerating it well without bleeding or bruising.  He has had no recurrent stroke or TIA symptoms.  He is also bothered by paresthesias in the right side of his body and is willing to try medications for it.  He can move around independently at home and in fact he has even been driving a bit. Update 10/15/2021 : He returns for follow-up after last visit 6 months ago.  He is accompanied by his wife.  He continues to have significant postop paresthesias on the right side as well as left body hemiataxia.  Uses a walker to walk most of the time but uses wheelchair for long distances.  Patient was tried on Topamax for his paresthesias but did not tolerated due to side effects and gabapentin was found to be not effective.  He has  noticed improvement in his voice.  Patient recently stopped Eliquis a month ago after his IVC filter was removed.  He has been referred by cardiology to electrophysiology and is being considered for the Watchman procedure.  He still has vertical diplopia but wears special prisms with tapes on the glasses and notices diplopia only when he looks up and when he is not wearing the prisms his blood pressure is well controlled today 126/74.  Patient's wife states that he does not want to take his medications the Remeron which seems to help him sleep well at night as well as Zoloft. Updated 04/16/2022 : He returns for follow-up after last visit 6 months ago.  He continues to have significant residual postop paresthesias, left body and ataxia as well as diplopia.  He states he did not take Topamax long enough to know if it was working and Lyrica did not help him.  He is still bothered by the paresthesias willing to try Topamax again.  He still has vertical diplopia is now wearing prism to help him somewhat.  Ataxia on the left side.  He is able  to ambulate a little bit with one-person assist was scheduled to undergo Watchman device in August intraprocedure TEE showed mild left atrial clot procedure hence watchman procedure was aborted.  He is back on Eliquis with plan to do Watchman device now in January 2024.  Patient has sold his house including another house which is 1 level it is not ready yet and to [facility] temporarily.. ROS:   14 system review of systems is positive for weakness, imbalance, walking difficulty, depression , double vision  PMH:  Past Medical History:  Diagnosis Date   Atrial flutter (HCC)    s/p ablation   DVT (deep venous thrombosis) (HCC)    Near syncope 09/25/2013   Paroxysmal atrial fibrillation (HCC)    Stroke (HCC)    initially hemorragic (not on OAC) followed by subsequent embolic stroke    Social History:  Social History   Socioeconomic History   Marital status: Married     Spouse name: cathy   Number of children: Not on file   Years of education: Not on file   Highest education level: Not on file  Occupational History   Not on file  Tobacco Use   Smoking status: Never   Smokeless tobacco: Never  Vaping Use   Vaping Use: Never used  Substance and Sexual Activity   Alcohol use: Yes    Comment: occasionally   Drug use: No   Sexual activity: Not on file  Other Topics Concern   Not on file  Social History Narrative   Lives with wife at home   Right Handed   Drinks decaf   Social Determinants of Health   Financial Resource Strain: Not on file  Food Insecurity: Not on file  Transportation Needs: Not on file  Physical Activity: Not on file  Stress: Not on file  Social Connections: Not on file  Intimate Partner Violence: Not on file    Medications:   Current Outpatient Medications on File Prior to Visit  Medication Sig Dispense Refill   apixaban (ELIQUIS) 5 MG TABS tablet Take 1 tablet (5 mg total) by mouth 2 (two) times daily. 60 tablet 1   atorvastatin (LIPITOR) 20 MG tablet Take 1 tablet (20 mg total) by mouth at bedtime. 90 tablet 3   EPINEPHrine 0.3 mg/0.3 mL IJ SOAJ injection Inject 0.3 mg into the muscle as needed for anaphylaxis.     mirtazapine (REMERON) 7.5 MG tablet TAKE 2 TABLETS BY MOUTH AT BEDTIME (Patient taking differently: Take 1 mg by mouth at bedtime.) 180 tablet 1   sertraline (ZOLOFT) 100 MG tablet Take 100 mg by mouth at bedtime.     diltiazem (CARDIZEM CD) 120 MG 24 hr capsule Take 1 capsule (120 mg total) by mouth daily. 90 capsule 3   Current Facility-Administered Medications on File Prior to Visit  Medication Dose Route Frequency Provider Last Rate Last Admin   0.9 %  sodium chloride infusion   Intravenous Continuous Walter Prude, MD       0.9 %  sodium chloride infusion   Intravenous Continuous Walter Prude, MD        Allergies:   Allergies  Allergen Reactions   Keppra [Levetiracetam] Swelling     Patient experienced angioedema post inpatient keppra dose   Latex Itching    Skin turns real red    Physical Exam General: well developed, well nourished middle-aged Caucasian male, seated, in no evident distress Head: head normocephalic and atraumatic.  Neck: supple with no carotid or  supraclavicular bruits Cardiovascular: regular rate and rhythm, no murmurs Musculoskeletal: no deformity.  Wearing right ankle boot for sprained ligament Skin:  no rash/petichiae Vascular:  Normal pulses all extremities Vitals:   04/16/22 1448  BP: 135/81  Pulse: 90   Neurologic Exam Mental Status: Awake and fully alert. Oriented to place and time. Recent and remote memory intact. Attention span, concentration and fund of knowledge appropriate. Mood and affect appropriate.  Speech is slightly  dysarthric and hypophonic. Cranial Nerves: Fundoscopic exam not done. Pupils equal, briskly reactive to light. Extraocular movements show skew deviation with left eye hypotrophic and moving less in vertical direction compared to the right.  Patient has binocular vertical diplopia which abolishes with closing either eye.  Dysmetric saccades on the right gaze.  Visual fields full to confrontation. Hearing intact. Facial sensation intact. Face, tongue, palate moves normally and symmetrically.  Motor: Normal bulk and tone. Normal strength in all tested extremity muscles except mild weakness of left grip and intrinsic hand muscles.  Orbits right over left upper extremity.  Mild weakness of left hip flexors and ankle dorsiflexors. Sensory.: impaired touch and pinprick on the right hemibody  coordination: Prominent left finger-to-nose and mild left knee to heel ataxia.. Gait and Station: Deferred today as patient is in a wheelchair Reflexes: 1+ and symmetric. Toes downgoing.     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  Prolonged hospitalization complicated by chronic atrial fibrillation, acute pulmonary embolism with respiratory failure, bilateral lower extremity DVT, tracheostomy, dysphagia poststroke and mild malnutrition.  Patient is making now slow improvement with ongoing therapies but diplopia and poststroke paresthesias continues to be bothersome  .[name]       PLAN: I had a long discussion with the patient and his wife regarding his remote brainstem hemorrhage and residual diplopia, left hemiataxia and right body paresthesias which still appear to be quite disabling.  Recommend continue ongoing aquatic therapy trial of Topamax 25 mg once a day for a week to be increased to twice daily if tolerated without side effects .Continue Eliquis for stroke prevention for A-fib and follow-up with cardiac electrophysiology for Watchman device and he is not a good long-term anticoagulation candidate due to high fall risk.  Return for follow-up in 6 months or call earlier if necessary  He was advised to use his walker at all times and we discussed fall safety precautions.  Return for follow-up with me in 6 months or call earlier if necessary. Greater than 50% of time during this 35 minute  visit was spent on counseling,explanation of diagnosis, of his indeterminate intracerebral hemorrhage and atrial fibrillation and risk benefit of anticoagulation planning of further management, discussion with patient and family and coordination of care Antony Contras, MD Note: This document was prepared with digital dictation and possible smart phrase technology. Any transcriptional errors that result from this process are unintentional

## 2022-04-16 NOTE — Patient Instructions (Signed)
I had a long discussion with the patient and his wife regarding his remote brainstem hemorrhage and residual diplopia, left hemiataxia and right body paresthesias which still appear to be quite disabling.  Recommend continue ongoing aquatic therapy trial of Topamax 25 mg once a day for a week to be increased to twice daily if tolerated without side effects no Eliquis for stroke prevention for A-fib and follow-up with cardiac electrophysiology for Watchman device and he is not a good long-term anticoagulation candidate due to high fall risk.  Return for follow-up in 6 months or call earlier if necessary

## 2022-04-29 ENCOUNTER — Ambulatory Visit: Payer: Medicare Other | Admitting: Physical Therapy

## 2022-05-08 ENCOUNTER — Other Ambulatory Visit: Payer: Self-pay | Admitting: Physician Assistant

## 2022-05-08 ENCOUNTER — Ambulatory Visit
Admission: RE | Admit: 2022-05-08 | Discharge: 2022-05-08 | Disposition: A | Discharge status: Home / Self Care | Payer: Medicare Other | Source: Ambulatory Visit | Attending: Physician Assistant | Admitting: Physician Assistant

## 2022-05-08 DIAGNOSIS — R059 Cough, unspecified: Secondary | ICD-10-CM

## 2022-05-19 ENCOUNTER — Other Ambulatory Visit: Payer: Self-pay | Admitting: Neurology

## 2022-05-20 ENCOUNTER — Encounter: Payer: Self-pay | Admitting: Physical Therapy

## 2022-05-20 ENCOUNTER — Ambulatory Visit: Payer: Medicare Other | Attending: Family Medicine | Admitting: Physical Therapy

## 2022-05-20 DIAGNOSIS — R2689 Other abnormalities of gait and mobility: Secondary | ICD-10-CM | POA: Insufficient documentation

## 2022-05-20 DIAGNOSIS — R278 Other lack of coordination: Secondary | ICD-10-CM | POA: Insufficient documentation

## 2022-05-20 DIAGNOSIS — I6990 Unspecified sequelae of unspecified cerebrovascular disease: Secondary | ICD-10-CM | POA: Insufficient documentation

## 2022-05-20 DIAGNOSIS — I69393 Ataxia following cerebral infarction: Secondary | ICD-10-CM | POA: Insufficient documentation

## 2022-05-20 DIAGNOSIS — R2681 Unsteadiness on feet: Secondary | ICD-10-CM | POA: Diagnosis present

## 2022-05-20 NOTE — Therapy (Signed)
OUTPATIENT PHYSICAL THERAPY NEURO EVALUATION   Patient Name: Walter [name] MRN: [id number] DOB:1955-09-01, 66 y.o., male Today's Date: 05/20/2022   PCP: Clovis Riley, L. August Saucer, MD REFERRING PROVIDER: Micki Riley, MD    PT End of Session - 05/20/22 1045     Visit Number 1    Number of Visits 9    Date for PT Re-Evaluation 08/02/22   due to scheduling conflict 1 week and planned surgical procedure in Jan. 2024   Authorization Type Medicare    Authorization Time Period 05-20-22 - 08-18-21    Progress Note Due on Visit 10    PT Start Time 0932    PT Stop Time 1015    PT Time Calculation (min) 43 min    Activity Tolerance Patient tolerated treatment well    Behavior During Therapy [facility] for tasks assessed/performed              Past Medical History:  Diagnosis Date   Atrial flutter (HCC)    s/p ablation   DVT (deep venous thrombosis) (HCC)    Near syncope 09/25/2013   Paroxysmal atrial fibrillation (HCC)    Stroke [facility])    initially hemorragic (not on OAC) followed by subsequent embolic stroke   Past Surgical History:  Procedure Laterality Date   A-FLUTTER ABLATION N/A 03/09/2020   Procedure: A-FLUTTER ABLATION;  Surgeon: Hillis Range, MD;  Location: [facility] INVASIVE CV LAB;  Service: Cardiovascular;  Laterality: N/A;   CARDIOVERSION N/A 09/27/2013   Procedure: CARDIOVERSION;  Surgeon: Lewayne Bunting, MD;  Location: [facility] ENDOSCOPY;  Service: Cardiovascular;  Laterality: N/A;   IR ANGIO EXTERNAL CAROTID SEL EXT CAROTID UNI R MOD SED  09/26/2020   IR ANGIO INTRA EXTRACRAN SEL COM CAROTID INNOMINATE BILAT MOD SED  09/26/2020   IR ANGIO VERTEBRAL SEL VERTEBRAL UNI R MOD SED  09/26/2020   IR IVC FILTER PLMT / S&I /IMG GUID/MOD SED  10/08/2020   IR IVC FILTER RETRIEVAL / S&I /IMG GUID/MOD SED  09/28/2021   IR IVUS EACH ADDITIONAL NON CORONARY VESSEL  11/02/2020   IR PTA VENOUS EXCEPT DIALYSIS CIRCUIT  11/02/2020   IR RADIOLOGIST EVAL & MGMT  01/17/2021   IR RADIOLOGIST EVAL & MGMT   09/12/2021   IR THROMBECT VENO MECH MOD SED  10/27/2020   IR THROMBECT VENO MECH MOD SED  11/02/2020   IR US GUIDE VASC ACCESS LEFT  10/27/2020   IR US GUIDE VASC ACCESS RIGHT  09/26/2020   IR US GUIDE VASC ACCESS RIGHT  11/02/2020   IR VENO/EXT/UNI RIGHT  11/02/2020   IR VENOCAVAGRAM IVC  10/27/2020   LEFT ATRIAL APPENDAGE OCCLUSION N/A 02/28/2022   Procedure: LEFT ATRIAL APPENDAGE OCCLUSION;  Surgeon: Lanier Prude, MD;  Location: [facility] INVASIVE CV LAB;  Service: Cardiovascular;  Laterality: N/A;   RETINAL DETACHMENT SURGERY     right knee arthroscopy     TEE WITHOUT CARDIOVERSION N/A 09/27/2013   Procedure: TRANSESOPHAGEAL ECHOCARDIOGRAM (TEE);  Surgeon: Lewayne Bunting, MD;  Location: [facility] ENDOSCOPY;  Service: Cardiovascular;  Laterality: N/A;   TEE WITHOUT CARDIOVERSION N/A 02/28/2022   Procedure: TRANSESOPHAGEAL ECHOCARDIOGRAM (TEE);  Surgeon: Lanier Prude, MD;  Location: [facility] INVASIVE CV LAB;  Service: Cardiovascular;  Laterality: N/A;   WRIST SURGERY     Patient Active Problem List   Diagnosis Date Noted   Hypertension 08/16/2021   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   Residual cognitive deficit as late effect of stroke 04/19/2021   Hemiparesis affecting right side as late  effect of stroke (HCC) 04/19/2021   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 04/19/2021   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 (HCC) 04/19/2021   Thrombosis    Sleep disturbance    Dysphagia, post-stroke    PAF (paroxysmal atrial fibrillation) (HCC)    Acute pulmonary embolism without acute cor pulmonale (HCC)    Malnutrition of moderate degree 11/04/2020   Tracheostomy care (HCC)    Pressure injury of skin 10/23/2020   Intracranial hemorrhage (HCC)    Atrial fibrillation (HCC)    Prediabetes    Leukocytosis    Acute blood loss anemia    Hypernatremia    ICH (intracerebral hemorrhage) (HCC) 09/25/2020   Unilateral primary osteoarthritis, left knee 09/26/2016   Atrial flutter (HCC)  09/25/2013   Near syncope 09/25/2013    ONSET DATE: 09-25-20:  Referral date 04-16-22  REFERRING DIAG: I69.90 (ICD-10-CM) - Late, effect, cerebrovascular disease I69.393 (ICD-10-CM) - Ataxia due to old cerebrovascular accident (CVA)  THERAPY DIAG:  Other abnormalities of gait and mobility  Other lack of coordination  Unsteadiness on feet  Rationale for Evaluation and Treatment Rehabilitation  SUBJECTIVE:                                                                                                                                                                                              SUBJECTIVE STATEMENT: Pt is a 66 yr old gentleman who presents to PT eval to resume aquatic PT:  pt was previously seen for land and aquatic PT at this facility from 01-29-22 to 04-08-22 to address gait and balance deficits due to ICH on 09-25-20:  Pt has had PT at this facility in the past year with prior admission March - April 2023;  pt also had inpatient rehab in [place] in Feb. 2023 and then outpatient PT in [place] for 2 weeks after D/C from inpatient rehab facility.    Pt reports [place] [facility] - went to Urgent Care in the area and received 7 staples in head.  No other injuries sustained.  Pt was scheduled to have Watchman implant on 02-28-22 for the atrial fibrillation, but this procedure was aborted due to TEE revealing mild left atrial clot.  Pt  continues to have decreased sensation on Rt side of body and has ataxia Lt side of body. Pt is using RW for assistance with ambulation.  Pt is using lighter weight walker in community because it is easier to get in and out of car.  Pt continues to have diplopia but states he is still  receiving vision therapy [place].   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 No  PRECAUTIONS: Fall (HIGH fall risk)  WEIGHT BEARING RESTRICTIONS No  FALLS: Has patient fallen in last 6 months? Yes. Number of falls approx. 14  falls   LIVING ENVIRONMENT: Lives with: lives with their spouse Lives in: apartment while house is being built - pt states house is supposed to be finished in Dec. '23 Stairs: No steps currently at apartment Has following equipment at home: Dan Humphreys - 2 wheeled, Shower bench, and Grab bars,manual wheelchair; pt reports he is going to order a [employer] states they are building new house with 9" slope differential to enter home - is supposed to be ready in Dec.   PLOF: Independent prior to CVA in March 2022  PATIENT GOALS "Improve balance as much as possible"; be able to exercise safely without fall risk   OBJECTIVE:   DIAGNOSTIC FINDINGS:  (after fall sustained 01-24-22) Medical Decision Making CT Head WO Contrast Final Result 1. No intracranial hemorrhage or depressed skull fractures. 2. Small bilateral regions of frontal lobe encephalomalacia without acute large territory infarction. 3. Degenerative changes in the cervical spine without acute fracture or subluxation.  COGNITION: Overall cognitive status: impulsive at times   SENSATION:  following are impaired on Rt side of body Light touch: Impaired  Hot/Cold: Impaired  Proprioception: Impaired    COORDINATION: Dysmetria noted with heel to shin  POSTURE: rounded shoulders and flexed trunk   LOWER EXTREMITY ROM:    WNL's bil. LE's   LOWER EXTREMITY MMT:  WFL's bil. LE's   BED MOBILITY:  Modified independent  TRANSFERS: Assistive device utilized: Environmental consultant - 2 wheeled pt needs cues for correct hand placement with use of RW with sit to/from stand transfers Sit to stand: CGA Stand to sit: CGA Pt needed mod assist to transfer from mat to standard chair without use of RW   GAIT: Gait pattern: ataxic Distance walked: 50' Assistive device utilized: Environmental consultant - 2 wheeled Level of assistance: CGA Comments: moderate ataxic gait pattern - needs cues to decrease speed    FUNCTIONAL TESTs:   Pt stood unsupported for 2" with close SBA - feet approx. 14" apart for increased BOS for assist with balance  PATIENT SURVEYS:  FOTO N/A due to CVA sustained 1 1/2 years ago    PATIENT EDUCATION: Education details: results of eval  Person educated: Patient Education method: Explanation Education comprehension: verbalized understanding   HOME EXERCISE PROGRAM: To be established    GOALS: Goals reviewed with patient? Yes  SHORT TERM GOALS: Target date: 06-21-22  Initiate HEP for aquatic exercises.  Baseline: dependent Goal status: INITIAL  2.  Pt will amb. 10" in pool in 4' water depth without UE support on floatation device with SBA for improved endurance/activity tolerance.  Baseline: to be assessed Goal status:  INITIAL   3. Pt will demonstrate improved balance by standing with feet </= 6"  apart for 2" in 4' water depth without LOB.  Baseline:  feet 14" apart at eval on 05-20-22 with SBA - pt stood for 2"   Goal status:  INITIAL   4.  Pt will negotiate steps in pool for entering and exiting with use of hand rails with min assist. Baseline: to be assessed Goal status: INITIAL     LONG TERM GOALS:  Target date:  07-26-22  Pt will perform updated HEP for aquatic exercises with assist from family/caregiver as needed for safety. Baseline:  Goal status: INITIAL  2.  Pt will amb. 15" nonstop  in pool in 4' water depth with supervision without LOB for increased endurance/activity tolerance.  Baseline: to be assessed Goal status: INITIAL  3.  Pt will negotiate steps in pool area with hand rails with CGA using step by step sequence.  Baseline:  to be assessed  Goal status:  INITIAL   ASSESSMENT:  CLINICAL IMPRESSION: Patient is a 66 y.o. gentleman who was seen today for physical therapy evaluation and treatment for gait and balance impairments due to h/o hemorrhagic CVA sustained on 09-25-20.  Pt presents with left hemiataxia and sensory deficits on Rt side of body.  Pt is currently using a RW to ambulate short community distances and uses wheelchair for longer community distances.  Pt also has diplopia which impacts balance and mobility.  Pt is requesting aquatic therapy only and no land based PT as he has recently received both land and aquatic based PT; pt is able to safely exercise in aquatic environment without the high fall risk which he currently has on land.  Pt is able to perform cardiovascular exercise safely in the water and increase heart rate, which he is unable to do without fall risk with land based exercise.   Pt will benefit from aquatic PT to address balance, coordination and gait deficits.   OBJECTIVE IMPAIRMENTS Abnormal gait, decreased activity tolerance, decreased balance, decreased coordination, decreased knowledge of use of DME, difficulty walking, decreased strength, impaired sensation, and impaired vision/preception.   ACTIVITY LIMITATIONS carrying, lifting, bending, standing, squatting, stairs, transfers, and dressing  PARTICIPATION LIMITATIONS: meal prep, cleaning, laundry, driving, shopping, and community activity  PERSONAL FACTORS Behavior pattern, Past/current experiences, and Time since onset of injury/illness/exacerbation are also affecting patient's functional outcome.   REHAB POTENTIAL: Fair due to chronicity of deficits & time since onset  CLINICAL  DECISION MAKING: Evolving/moderate complexity  EVALUATION COMPLEXITY: Moderate  PLAN: PT FREQUENCY: 1x/week  PT DURATION: 8 weeks  PLANNED INTERVENTIONS: Patient/Family education, Self Care, and Aquatic Therapy  PLAN FOR NEXT SESSION: aquatic exercise only per pt's request   Kary Kos, PT 05/20/2022, 7:58 PM

## 2022-05-20 NOTE — Addendum Note (Signed)
Addended by: Althea Charon on: 05/20/2022 08:34 PM   Modules accepted: Orders

## 2022-05-24 ENCOUNTER — Other Ambulatory Visit: Payer: Self-pay | Admitting: Neurology

## 2022-05-27 ENCOUNTER — Ambulatory Visit: Payer: Medicare Other | Admitting: Physical Therapy

## 2022-05-27 DIAGNOSIS — R2689 Other abnormalities of gait and mobility: Secondary | ICD-10-CM

## 2022-05-27 DIAGNOSIS — R2681 Unsteadiness on feet: Secondary | ICD-10-CM

## 2022-05-27 DIAGNOSIS — R278 Other lack of coordination: Secondary | ICD-10-CM

## 2022-05-28 ENCOUNTER — Encounter: Payer: Self-pay | Admitting: Physical Therapy

## 2022-05-28 ENCOUNTER — Encounter: Payer: Self-pay | Admitting: Neurology

## 2022-05-28 NOTE — Therapy (Signed)
OUTPATIENT PHYSICAL THERAPY NEURO TREATMENT NOTE   Patient Name: Walter [name] MRN: [id number] DOB:05-Jun-1956, 66 y.o., male Today's Date: 05/28/2022   PCP: Clovis Riley, L. August Saucer, MD REFERRING PROVIDER: Micki Riley, MD    PT End of Session - 05/28/22 1613     Visit Number 2    Number of Visits 9    Date for PT Re-Evaluation 08/02/22   due to scheduling conflict 1 week and planned surgical procedure in Jan. 2024   Authorization Type Medicare    Authorization Time Period 05-20-22 - 08-18-21    Progress Note Due on Visit 10    PT Start Time 1450    PT Stop Time 1535    PT Time Calculation (min) 45 min    Equipment Utilized During Treatment Other (comment)   bar bell   Activity Tolerance Patient tolerated treatment well    Behavior During Therapy [facility] for tasks assessed/performed              Past Medical History:  Diagnosis Date   Atrial flutter (HCC)    s/p ablation   DVT (deep venous thrombosis) (HCC)    Near syncope 09/25/2013   Paroxysmal atrial fibrillation (HCC)    Stroke [facility])    initially hemorragic (not on OAC) followed by subsequent embolic stroke   Past Surgical History:  Procedure Laterality Date   A-FLUTTER ABLATION N/A 03/09/2020   Procedure: A-FLUTTER ABLATION;  Surgeon: Hillis Range, MD;  Location: [facility] INVASIVE CV LAB;  Service: Cardiovascular;  Laterality: N/A;   CARDIOVERSION N/A 09/27/2013   Procedure: CARDIOVERSION;  Surgeon: Lewayne Bunting, MD;  Location: [facility] ENDOSCOPY;  Service: Cardiovascular;  Laterality: N/A;   IR ANGIO EXTERNAL CAROTID SEL EXT CAROTID UNI R MOD SED  09/26/2020   IR ANGIO INTRA EXTRACRAN SEL COM CAROTID INNOMINATE BILAT MOD SED  09/26/2020   IR ANGIO VERTEBRAL SEL VERTEBRAL UNI R MOD SED  09/26/2020   IR IVC FILTER PLMT / S&I /IMG GUID/MOD SED  10/08/2020   IR IVC FILTER RETRIEVAL / S&I /IMG GUID/MOD SED  09/28/2021   IR IVUS EACH ADDITIONAL NON CORONARY VESSEL  11/02/2020   IR PTA VENOUS EXCEPT DIALYSIS CIRCUIT  11/02/2020   IR  RADIOLOGIST EVAL & MGMT  01/17/2021   IR RADIOLOGIST EVAL & MGMT  09/12/2021   IR THROMBECT VENO MECH MOD SED  10/27/2020   IR THROMBECT VENO MECH MOD SED  11/02/2020   IR US GUIDE VASC ACCESS LEFT  10/27/2020   IR US GUIDE VASC ACCESS RIGHT  09/26/2020   IR US GUIDE VASC ACCESS RIGHT  11/02/2020   IR VENO/EXT/UNI RIGHT  11/02/2020   IR VENOCAVAGRAM IVC  10/27/2020   LEFT ATRIAL APPENDAGE OCCLUSION N/A 02/28/2022   Procedure: LEFT ATRIAL APPENDAGE OCCLUSION;  Surgeon: Lanier Prude, MD;  Location: [facility] INVASIVE CV LAB;  Service: Cardiovascular;  Laterality: N/A;   RETINAL DETACHMENT SURGERY     right knee arthroscopy     TEE WITHOUT CARDIOVERSION N/A 09/27/2013   Procedure: TRANSESOPHAGEAL ECHOCARDIOGRAM (TEE);  Surgeon: Lewayne Bunting, MD;  Location: [facility] ENDOSCOPY;  Service: Cardiovascular;  Laterality: N/A;   TEE WITHOUT CARDIOVERSION N/A 02/28/2022   Procedure: TRANSESOPHAGEAL ECHOCARDIOGRAM (TEE);  Surgeon: Lanier Prude, MD;  Location: [facility] INVASIVE CV LAB;  Service: Cardiovascular;  Laterality: N/A;   WRIST SURGERY     Patient Active Problem List   Diagnosis Date Noted   Hypertension 08/16/2021   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   Residual cognitive deficit as  late effect of stroke 04/19/2021   Hemiparesis affecting right side as late effect of stroke (HCC) 04/19/2021   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 04/19/2021   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 (HCC) 04/19/2021   Thrombosis    Sleep disturbance    Dysphagia, post-stroke    PAF (paroxysmal atrial fibrillation) (HCC)    Acute pulmonary embolism without acute cor pulmonale (HCC)    Malnutrition of moderate degree 11/04/2020   Tracheostomy care (HCC)    Pressure injury of skin 10/23/2020   Intracranial hemorrhage (HCC)    Atrial fibrillation (HCC)    Prediabetes    Leukocytosis    Acute blood loss anemia    Hypernatremia    ICH (intracerebral hemorrhage) (HCC) 09/25/2020   Unilateral  primary osteoarthritis, left knee 09/26/2016   Atrial flutter (HCC) 09/25/2013   Near syncope 09/25/2013    ONSET DATE: 09-25-20:  Referral date 04-16-22  REFERRING DIAG: I69.90 (ICD-10-CM) - Late, effect, cerebrovascular disease I69.393 (ICD-10-CM) - Ataxia due to old cerebrovascular accident (CVA)  THERAPY DIAG:  Other abnormalities of gait and mobility  Other lack of coordination  Unsteadiness on feet  Rationale for Evaluation and Treatment Rehabilitation  SUBJECTIVE:                                                                                                                                                                                              SUBJECTIVE STATEMENT: Pt presents for aquatic therapy at [facility]; no changes reported  Pt accompanied by: wife, [name]  PERTINENT HISTORY: Atrial flutter (HCC)s/p ablation DVT (deep venous thrombosis) (HCC) Near syncope03/28/2015 Paroxysmal atrial fibrillation (HCC) Stroke (HCC)initially hemorragic (not on OAC) followed by subsequent embolic stroke; has Lt hemiataxia and sensory deficits on Rt side of body; also 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 No  PRECAUTIONS: Fall (HIGH fall risk)  WEIGHT BEARING RESTRICTIONS No  FALLS: Has patient fallen in last 6 months? Yes. Number of falls approx. 14  falls   LIVING ENVIRONMENT: Lives with: lives with their spouse Lives in: apartment while house is being built - pt states house is supposed to be finished in Dec. '23 Stairs: No steps currently at apartment Has following equipment at home: Dan Humphreys - 2 wheeled, Shower bench, and Grab bars,manual wheelchair; pt reports he is going to order a [employer] states they are building new house with 9" slope differential to enter home - is supposed to be ready in Dec.   PLOF: Independent prior to CVA in March 2022  PATIENT GOALS "Improve balance as much as  possible"; be able to exercise safely  without fall risk    OBJECTIVE:    Aquatic therapy at [facility] - pool temp 92 degrees  Patient seen for aquatic therapy today.  Treatment took place in water 3.6-4.8 feet deep depending upon activity.  Pt entered and exited the the pool via step negotiation with use of bilateral hand rails with CGA to SBA using step by step sequence.  Pt performed water walking for warm up - forwards, backwards and sideways 18' x 4 reps each direction across width of pool  Pt performed marching in place with cues to perform slowly and to tighten core for improved stabilization - 10 reps each leg - no UE support used  Pt performed hip kicks - unilateral LE's with CGA and with use of pool edge prn for assist with balance recovery - forward, back and side kicks 10 reps each direction   Jumping straight up/down 10 reps with CGA but without UE support; 1/2 jumping jacks 10 reps; jogging in place 10 reps with SBA   Stepping strategy - forward and side stepping 10 reps each leg   Pt requires buoyancy of water for support for reduced fall risk with balance exercises in aquatic environment;  exercises able to be performed safely in water compared to that on land with higher fall risk;  current of water provides perturbations for challenges with static & dynamic standing balace.  Pt also requires viscosity of water for strengthening bil. lower extremities and trunk musculature for improved core stabilization.       GOALS: Goals reviewed with patient? Yes  SHORT TERM GOALS: Target date: 06-21-22  Initiate HEP for aquatic exercises.  Baseline: dependent Goal status: INITIAL  2.  Pt will amb. 10" in pool in 4' water depth without UE support on floatation device with SBA for improved endurance/activity tolerance.  Baseline: to be assessed Goal status:  INITIAL   3. Pt will demonstrate improved balance by standing with feet </= 6" apart for 2" in 4' water depth without LOB.  Baseline:  feet 14" apart at  eval on 05-20-22 with SBA - pt stood for 2"   Goal status:  INITIAL   4.  Pt will negotiate steps in pool for entering and exiting with use of hand rails with min assist. Baseline: to be assessed Goal status: INITIAL     LONG TERM GOALS:  Target date:  07-26-22  Pt will perform updated HEP for aquatic exercises with assist from family/caregiver as needed for safety. Baseline:  Goal status: INITIAL  2.  Pt will amb. 15" nonstop in pool in 4' water depth with supervision without LOB for increased endurance/activity tolerance.  Baseline: to be assessed Goal status: INITIAL  3.  Pt will negotiate steps in pool area with hand rails with CGA using step by step sequence.  Baseline:  to be assessed  Goal status:  INITIAL   ASSESSMENT:  CLINICAL IMPRESSION: PT session focused on balance and gait training in 4.5' water depth without UE support per pt's request.  Pt performed static and dynamic standing balance exercises with CGA to min assist needed for balance recovery and to assist with static standing.  Pt's visual deficits impact balance.  Cont with POC.    OBJECTIVE IMPAIRMENTS Abnormal gait, decreased activity tolerance, decreased balance, decreased coordination, decreased knowledge of use of DME, difficulty walking, decreased strength, impaired sensation, and impaired vision/preception.   ACTIVITY LIMITATIONS carrying, lifting, bending, standing, squatting, stairs, transfers, and dressing  PARTICIPATION  LIMITATIONS: meal prep, cleaning, laundry, driving, shopping, and community activity  PERSONAL FACTORS Behavior pattern, Past/current experiences, and Time since onset of injury/illness/exacerbation are also affecting patient's functional outcome.   REHAB POTENTIAL: Fair due to chronicity of deficits & time since onset  CLINICAL DECISION MAKING: Evolving/moderate complexity  EVALUATION COMPLEXITY: Moderate  PLAN: PT FREQUENCY: 1x/week  PT DURATION: 8 weeks  PLANNED  INTERVENTIONS: Patient/Family education, Self Care, and Aquatic Therapy  PLAN FOR NEXT SESSION: aquatic exercise only per pt's request   Kary Kos, PT 05/28/2022, 4:16 PM

## 2022-05-30 NOTE — Telephone Encounter (Signed)
Letter printed and given to MD for review and signature.

## 2022-06-03 ENCOUNTER — Ambulatory Visit: Payer: Medicare Other | Admitting: Internal Medicine

## 2022-06-03 ENCOUNTER — Ambulatory Visit: Payer: Medicare Other | Attending: Family Medicine | Admitting: Physical Therapy

## 2022-06-03 DIAGNOSIS — R278 Other lack of coordination: Secondary | ICD-10-CM | POA: Insufficient documentation

## 2022-06-03 DIAGNOSIS — R2689 Other abnormalities of gait and mobility: Secondary | ICD-10-CM | POA: Insufficient documentation

## 2022-06-03 DIAGNOSIS — R2681 Unsteadiness on feet: Secondary | ICD-10-CM | POA: Insufficient documentation

## 2022-06-04 ENCOUNTER — Encounter: Payer: Self-pay | Admitting: Physical Therapy

## 2022-06-04 ENCOUNTER — Ambulatory Visit: Payer: Medicare Other | Admitting: Internal Medicine

## 2022-06-04 NOTE — Therapy (Signed)
OUTPATIENT PHYSICAL THERAPY NEURO TREATMENT NOTE   Patient Name: Walter [name] MRN: [id number] DOB:1955/08/22, 66 y.o., male Today's Date: 06/04/2022   PCP: Clovis Riley, L. August Saucer, MD REFERRING PROVIDER: Micki Riley, MD    PT End of Session - 06/04/22 2031     Visit Number 3    Number of Visits 9    Date for PT Re-Evaluation 08/02/22   due to scheduling conflict 1 week and planned surgical procedure in Jan. 2024   Authorization Type Medicare    Authorization Time Period 05-20-22 - 08-18-21    Progress Note Due on Visit 10    PT Start Time 1430    PT Stop Time 1530    PT Time Calculation (min) 60 min    Equipment Utilized During Treatment Other (comment)   bar bell, aquatic step   Activity Tolerance Patient tolerated treatment well    Behavior During Therapy WFL for tasks assessed/performed              Past Medical History:  Diagnosis Date   Atrial flutter (HCC)    s/p ablation   DVT (deep venous thrombosis) (HCC)    Near syncope 09/25/2013   Paroxysmal atrial fibrillation (HCC)    Stroke [facility])    initially hemorragic (not on OAC) followed by subsequent embolic stroke   Past Surgical History:  Procedure Laterality Date   A-FLUTTER ABLATION N/A 03/09/2020   Procedure: A-FLUTTER ABLATION;  Surgeon: Hillis Range, MD;  Location: [facility] INVASIVE CV LAB;  Service: Cardiovascular;  Laterality: N/A;   CARDIOVERSION N/A 09/27/2013   Procedure: CARDIOVERSION;  Surgeon: Lewayne Bunting, MD;  Location: [facility] ENDOSCOPY;  Service: Cardiovascular;  Laterality: N/A;   IR ANGIO EXTERNAL CAROTID SEL EXT CAROTID UNI R MOD SED  09/26/2020   IR ANGIO INTRA EXTRACRAN SEL COM CAROTID INNOMINATE BILAT MOD SED  09/26/2020   IR ANGIO VERTEBRAL SEL VERTEBRAL UNI R MOD SED  09/26/2020   IR IVC FILTER PLMT / S&I /IMG GUID/MOD SED  10/08/2020   IR IVC FILTER RETRIEVAL / S&I /IMG GUID/MOD SED  09/28/2021   IR IVUS EACH ADDITIONAL NON CORONARY VESSEL  11/02/2020   IR PTA VENOUS EXCEPT DIALYSIS CIRCUIT   11/02/2020   IR RADIOLOGIST EVAL & MGMT  01/17/2021   IR RADIOLOGIST EVAL & MGMT  09/12/2021   IR THROMBECT VENO MECH MOD SED  10/27/2020   IR THROMBECT VENO MECH MOD SED  11/02/2020   IR US GUIDE VASC ACCESS LEFT  10/27/2020   IR US GUIDE VASC ACCESS RIGHT  09/26/2020   IR US GUIDE VASC ACCESS RIGHT  11/02/2020   IR VENO/EXT/UNI RIGHT  11/02/2020   IR VENOCAVAGRAM IVC  10/27/2020   LEFT ATRIAL APPENDAGE OCCLUSION N/A 02/28/2022   Procedure: LEFT ATRIAL APPENDAGE OCCLUSION;  Surgeon: Lanier Prude, MD;  Location: [facility] INVASIVE CV LAB;  Service: Cardiovascular;  Laterality: N/A;   RETINAL DETACHMENT SURGERY     right knee arthroscopy     TEE WITHOUT CARDIOVERSION N/A 09/27/2013   Procedure: TRANSESOPHAGEAL ECHOCARDIOGRAM (TEE);  Surgeon: Lewayne Bunting, MD;  Location: [facility] ENDOSCOPY;  Service: Cardiovascular;  Laterality: N/A;   TEE WITHOUT CARDIOVERSION N/A 02/28/2022   Procedure: TRANSESOPHAGEAL ECHOCARDIOGRAM (TEE);  Surgeon: Lanier Prude, MD;  Location: [facility] INVASIVE CV LAB;  Service: Cardiovascular;  Laterality: N/A;   WRIST SURGERY     Patient Active Problem List   Diagnosis Date Noted   Hypertension 08/16/2021   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   Residual cognitive  deficit as late effect of stroke 04/19/2021   Hemiparesis affecting right side as late effect of stroke (HCC) 04/19/2021   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 04/19/2021   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 (HCC) 04/19/2021   Thrombosis    Sleep disturbance    Dysphagia, post-stroke    PAF (paroxysmal atrial fibrillation) (HCC)    Acute pulmonary embolism without acute cor pulmonale (HCC)    Malnutrition of moderate degree 11/04/2020   Tracheostomy care (HCC)    Pressure injury of skin 10/23/2020   Intracranial hemorrhage (HCC)    Atrial fibrillation (HCC)    Prediabetes    Leukocytosis    Acute blood loss anemia    Hypernatremia    ICH (intracerebral hemorrhage) (HCC) 09/25/2020    Unilateral primary osteoarthritis, left knee 09/26/2016   Atrial flutter (HCC) 09/25/2013   Near syncope 09/25/2013    ONSET DATE: 09-25-20:  Referral date 04-16-22  REFERRING DIAG: I69.90 (ICD-10-CM) - Late, effect, cerebrovascular disease I69.393 (ICD-10-CM) - Ataxia due to old cerebrovascular accident (CVA)  THERAPY DIAG:  Other abnormalities of gait and mobility  Other lack of coordination  Unsteadiness on feet  Rationale for Evaluation and Treatment Rehabilitation  SUBJECTIVE:                                                                                                                                                                                              SUBJECTIVE STATEMENT: Pt presents for aquatic therapy at [facility]; pt reports no changes since previous session last week  Pt accompanied by: wife, [name]  PERTINENT HISTORY: Atrial flutter (HCC)s/p ablation DVT (deep venous thrombosis) (HCC) Near syncope03/28/2015 Paroxysmal atrial fibrillation (HCC) Stroke (HCC)initially hemorragic (not on OAC) followed by subsequent embolic stroke; has Lt hemiataxia and sensory deficits on Rt side of body; also 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 No  PRECAUTIONS: Fall (HIGH fall risk)  WEIGHT BEARING RESTRICTIONS No  FALLS: Has patient fallen in last 6 months? Yes. Number of falls approx. 14  falls   LIVING ENVIRONMENT: Lives with: lives with their spouse Lives in: apartment while house is being built - pt states house is supposed to be finished in Dec. '23 Stairs: No steps currently at apartment Has following equipment at home: Dan Humphreys - 2 wheeled, Shower bench, and Grab bars,manual wheelchair; pt reports he is going to order a [employer] states they are building new house with 9" slope differential to enter home - is supposed to be ready in Dec.   PLOF: Independent prior to CVA in March 2022  PATIENT GOALS "Improve balance as  much as possible"; be able to exercise safely without fall risk    OBJECTIVE:    Aquatic therapy at [facility] - pool temp 98 degrees  Patient seen for aquatic therapy today.  Treatment took place in water 3.6-4.8 feet deep depending upon activity.  Pt entered and exited the the pool via step negotiation with use of bilateral hand rails with CGA to SBA using step by step sequence.  Pt performed water walking for warm up - forwards, backwards and sideways 18' x 4 reps each direction across width of pool  Pt performed marching in place with cues to perform slowly and to tighten core for improved stabilization - 10 reps each leg - no UE support used  Pt performed hip kicks - unilateral LE's with CGA and with use of pool edge prn for assist with balance recovery - forward, back and side kicks 10 reps each direction   Jumping straight up/down 10 reps with CGA but without UE support; 1/2 jumping jacks 10 reps; jogging in place 10 reps with SBA   Stepping strategy - forward, backward, and side stepping 10 reps each leg with min assist for balance recovery; mod assist for backward stepping   Pt performed static standing for core stabilization - moved bar bells in horizontal abduction/adduction and forward/back (protraction/retraction) 10 reps each direction with min assist intermittently for balance recovery  Tall kneeling on aquatic step on pool floor - pt performed mini squats 10 reps; hip extension and hip abduction each leg in tall kneeling 10 reps each with cues and CGA for upright posture and balance recovery  Pt requires buoyancy of water for support for reduced fall risk with balance exercises in aquatic environment;  exercises able to be performed safely in water compared to that on land with higher fall risk;  current of water provides perturbations for challenges with static & dynamic standing balace.  Pt also requires viscosity of water for strengthening bil. lower extremities and trunk  musculature for improved core stabilization.       GOALS: Goals reviewed with patient? Yes  SHORT TERM GOALS: Target date: 06-21-22  Initiate HEP for aquatic exercises.  Baseline: dependent Goal status: INITIAL  2.  Pt will amb. 10" in pool in 4' water depth without UE support on floatation device with SBA for improved endurance/activity tolerance.  Baseline: to be assessed Goal status:  INITIAL   3. Pt will demonstrate improved balance by standing with feet </= 6" apart for 2" in 4' water depth without LOB.  Baseline:  feet 14" apart at eval on 05-20-22 with SBA - pt stood for 2"   Goal status:  INITIAL   4.  Pt will negotiate steps in pool for entering and exiting with use of hand rails with min assist. Baseline: to be assessed Goal status: INITIAL     LONG TERM GOALS:  Target date:  07-26-22  Pt will perform updated HEP for aquatic exercises with assist from family/caregiver as needed for safety. Baseline:  Goal status: INITIAL  2.  Pt will amb. 15" nonstop in pool in 4' water depth with supervision without LOB for increased endurance/activity tolerance.  Baseline: to be assessed Goal status: INITIAL  3.  Pt will negotiate steps in pool area with hand rails with CGA using step by step sequence.  Baseline:  to be assessed  Goal status:  INITIAL   ASSESSMENT:  CLINICAL IMPRESSION: PT session focused on gait and balance training in  4.5' water depth and also on balance and coordination exercises.  Pt needs tactile and verbal cues for upright posture to increase trunk and hip extension as pt has tendency to lean slightly forward.  Pt's visual deficits continue to impact balance.  Pt needed tactile cue to prevent cervical extension with exercises in tall kneeling position on aquatic step in pool.  Cont with POC.    OBJECTIVE IMPAIRMENTS Abnormal gait, decreased activity tolerance, decreased balance, decreased coordination, decreased knowledge of use of DME, difficulty  walking, decreased strength, impaired sensation, and impaired vision/preception.   ACTIVITY LIMITATIONS carrying, lifting, bending, standing, squatting, stairs, transfers, and dressing  PARTICIPATION LIMITATIONS: meal prep, cleaning, laundry, driving, shopping, and community activity  PERSONAL FACTORS Behavior pattern, Past/current experiences, and Time since onset of injury/illness/exacerbation are also affecting patient's functional outcome.   REHAB POTENTIAL: Fair due to chronicity of deficits & time since onset  CLINICAL DECISION MAKING: Evolving/moderate complexity  EVALUATION COMPLEXITY: Moderate  PLAN: PT FREQUENCY: 1x/week  PT DURATION: 8 weeks  PLANNED INTERVENTIONS: Patient/Family education, Self Care, and Aquatic Therapy  PLAN FOR NEXT SESSION: aquatic exercise only per pt's request   Kary Kos, PT 06/04/2022, 8:33 PM

## 2022-06-17 ENCOUNTER — Encounter: Payer: Self-pay | Admitting: Physical Therapy

## 2022-06-17 ENCOUNTER — Ambulatory Visit: Payer: Medicare Other | Admitting: Physical Therapy

## 2022-06-17 DIAGNOSIS — R278 Other lack of coordination: Secondary | ICD-10-CM

## 2022-06-17 DIAGNOSIS — R2689 Other abnormalities of gait and mobility: Secondary | ICD-10-CM

## 2022-06-17 DIAGNOSIS — R2681 Unsteadiness on feet: Secondary | ICD-10-CM

## 2022-06-17 NOTE — Therapy (Signed)
OUTPATIENT PHYSICAL THERAPY NEURO TREATMENT NOTE   Patient Name: Walter [name] MRN: [id number] DOB:Jun 18, 1956, 66 y.o., male Today's Date: 06/17/2022   PCP: Clovis Riley, L. August Saucer, MD REFERRING PROVIDER: Micki Riley, MD    PT End of Session - 06/17/22 2027     Visit Number 4    Number of Visits 9    Date for PT Re-Evaluation 08/02/22   due to scheduling conflict 1 week and planned surgical procedure in Jan. 2024   Authorization Type Medicare    Authorization Time Period 05-20-22 - 08-18-21    Progress Note Due on Visit 10    PT Start Time 1440    PT Stop Time 1530    PT Time Calculation (min) 50 min    Equipment Utilized During Treatment Other (comment)   bar bell, aquatic step   Activity Tolerance Patient tolerated treatment well    Behavior During Therapy WFL for tasks assessed/performed              Past Medical History:  Diagnosis Date   Atrial flutter (HCC)    s/p ablation   DVT (deep venous thrombosis) (HCC)    Near syncope 09/25/2013   Paroxysmal atrial fibrillation (HCC)    Stroke [facility])    initially hemorragic (not on OAC) followed by subsequent embolic stroke   Past Surgical History:  Procedure Laterality Date   A-FLUTTER ABLATION N/A 03/09/2020   Procedure: A-FLUTTER ABLATION;  Surgeon: Hillis Range, MD;  Location: [facility] INVASIVE CV LAB;  Service: Cardiovascular;  Laterality: N/A;   CARDIOVERSION N/A 09/27/2013   Procedure: CARDIOVERSION;  Surgeon: Lewayne Bunting, MD;  Location: [facility] ENDOSCOPY;  Service: Cardiovascular;  Laterality: N/A;   IR ANGIO EXTERNAL CAROTID SEL EXT CAROTID UNI R MOD SED  09/26/2020   IR ANGIO INTRA EXTRACRAN SEL COM CAROTID INNOMINATE BILAT MOD SED  09/26/2020   IR ANGIO VERTEBRAL SEL VERTEBRAL UNI R MOD SED  09/26/2020   IR IVC FILTER PLMT / S&I /IMG GUID/MOD SED  10/08/2020   IR IVC FILTER RETRIEVAL / S&I /IMG GUID/MOD SED  09/28/2021   IR IVUS EACH ADDITIONAL NON CORONARY VESSEL  11/02/2020   IR PTA VENOUS EXCEPT DIALYSIS CIRCUIT   11/02/2020   IR RADIOLOGIST EVAL & MGMT  01/17/2021   IR RADIOLOGIST EVAL & MGMT  09/12/2021   IR THROMBECT VENO MECH MOD SED  10/27/2020   IR THROMBECT VENO MECH MOD SED  11/02/2020   IR US GUIDE VASC ACCESS LEFT  10/27/2020   IR US GUIDE VASC ACCESS RIGHT  09/26/2020   IR US GUIDE VASC ACCESS RIGHT  11/02/2020   IR VENO/EXT/UNI RIGHT  11/02/2020   IR VENOCAVAGRAM IVC  10/27/2020   LEFT ATRIAL APPENDAGE OCCLUSION N/A 02/28/2022   Procedure: LEFT ATRIAL APPENDAGE OCCLUSION;  Surgeon: Lanier Prude, MD;  Location: [facility] INVASIVE CV LAB;  Service: Cardiovascular;  Laterality: N/A;   RETINAL DETACHMENT SURGERY     right knee arthroscopy     TEE WITHOUT CARDIOVERSION N/A 09/27/2013   Procedure: TRANSESOPHAGEAL ECHOCARDIOGRAM (TEE);  Surgeon: Lewayne Bunting, MD;  Location: [facility] ENDOSCOPY;  Service: Cardiovascular;  Laterality: N/A;   TEE WITHOUT CARDIOVERSION N/A 02/28/2022   Procedure: TRANSESOPHAGEAL ECHOCARDIOGRAM (TEE);  Surgeon: Lanier Prude, MD;  Location: [facility] INVASIVE CV LAB;  Service: Cardiovascular;  Laterality: N/A;   WRIST SURGERY     Patient Active Problem List   Diagnosis Date Noted   Hypertension 08/16/2021   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   Residual cognitive  deficit as late effect of stroke 04/19/2021   Hemiparesis affecting right side as late effect of stroke (HCC) 04/19/2021   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 04/19/2021   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 (HCC) 04/19/2021   Thrombosis    Sleep disturbance    Dysphagia, post-stroke    PAF (paroxysmal atrial fibrillation) (HCC)    Acute pulmonary embolism without acute cor pulmonale (HCC)    Malnutrition of moderate degree 11/04/2020   Tracheostomy care (HCC)    Pressure injury of skin 10/23/2020   Intracranial hemorrhage (HCC)    Atrial fibrillation (HCC)    Prediabetes    Leukocytosis    Acute blood loss anemia    Hypernatremia    ICH (intracerebral hemorrhage) (HCC) 09/25/2020    Unilateral primary osteoarthritis, left knee 09/26/2016   Atrial flutter (HCC) 09/25/2013   Near syncope 09/25/2013    ONSET DATE: 09-25-20:  Referral date 04-16-22  REFERRING DIAG: I69.90 (ICD-10-CM) - Late, effect, cerebrovascular disease I69.393 (ICD-10-CM) - Ataxia due to old cerebrovascular accident (CVA)  THERAPY DIAG:  Other abnormalities of gait and mobility  Other lack of coordination  Unsteadiness on feet  Rationale for Evaluation and Treatment Rehabilitation  SUBJECTIVE:                                                                                                                                                                                              SUBJECTIVE STATEMENT: Pt presents for aquatic therapy at [facility]; pt reports no changes since previous session 2 weeks ago; no falls reported  Pt accompanied by: wife, [name]  PERTINENT HISTORY: Atrial flutter (HCC)s/p ablation DVT (deep venous thrombosis) (HCC) Near syncope03/28/2015 Paroxysmal atrial fibrillation (HCC) Stroke (HCC)initially hemorragic (not on OAC) followed by subsequent embolic stroke; has Lt hemiataxia and sensory deficits on Rt side of body; also 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 No  PRECAUTIONS: Fall (HIGH fall risk)  WEIGHT BEARING RESTRICTIONS No  FALLS: Has patient fallen in last 6 months? Yes. Number of falls approx. 14  falls   LIVING ENVIRONMENT: Lives with: lives with their spouse Lives in: apartment while house is being built - pt states house is supposed to be finished in Dec. '23 Stairs: No steps currently at apartment Has following equipment at home: Dan Humphreys - 2 wheeled, Tour manager, and Grab bars,manual wheelchair; pt reports he is going to order a [employer] states they are building new house with 9" slope differential to enter home - is supposed to be ready in Dec.   PLOF: Independent prior to  CVA in March 2022  PATIENT  GOALS "Improve balance as much as possible"; be able to exercise safely without fall risk    OBJECTIVE:    Aquatic therapy at [facility] - pool temp 90 degrees  Patient seen for aquatic therapy today.  Treatment took place in water 3.6-4.8 feet deep depending upon activity.  Pt entered and exited the the pool via step negotiation with use of bilateral hand rails with CGA to SBA using step by step sequence.  Pt performed water walking for warm up - forwards, backwards and sideways 18' x 4 reps each direction across width of pool  Pt performed marching in place with cues to perform slowly and to tighten core for improved stabilization - 10 reps each leg - no UE support used  Pt performed hip kicks - unilateral LE's with CGA and with use of pool edge prn for assist with balance recovery - forward, back and side kicks 10 reps each direction   Jumping straight up/down 10 reps with CGA but without UE support;  Stepping strategy - forward, backward, and side stepping 10 reps each leg with min assist for balance recovery; mod assist for backward stepping   Pt performed static standing for core stabilization - moved bar bells in horizontal abduction/adduction and forward/back (protraction/retraction) 10 reps each direction with min assist intermittently for balance recovery  Tall kneeling on aquatic step on pool floor - pt performed mini squats 10 reps; hip extension and hip abduction each leg in tall kneeling 10 reps each with cues and CGA for upright posture and balance recovery  Pt requires buoyancy of water for support for reduced fall risk with balance exercises in aquatic environment;  exercises able to be performed safely in water compared to that on land with higher fall risk;  current of water provides perturbations for challenges with static & dynamic standing balace.  Pt also requires viscosity of water for strengthening bil. lower extremities and trunk musculature for improved core  stabilization.       GOALS: Goals reviewed with patient? Yes  SHORT TERM GOALS: Target date: 06-21-22  Initiate HEP for aquatic exercises.  Baseline: dependent Goal status: INITIAL  2.  Pt will amb. 10" in pool in 4' water depth without UE support on floatation device with SBA for improved endurance/activity tolerance.  Baseline: to be assessed Goal status:  INITIAL   3. Pt will demonstrate improved balance by standing with feet </= 6" apart for 2" in 4' water depth without LOB.  Baseline:  feet 14" apart at eval on 05-20-22 with SBA - pt stood for 2"   Goal status:  INITIAL   4.  Pt will negotiate steps in pool for entering and exiting with use of hand rails with min assist. Baseline: to be assessed Goal status: INITIAL     LONG TERM GOALS:  Target date:  07-26-22  Pt will perform updated HEP for aquatic exercises with assist from family/caregiver as needed for safety. Baseline:  Goal status: INITIAL  2.  Pt will amb. 15" nonstop in pool in 4' water depth with supervision without LOB for increased endurance/activity tolerance.  Baseline: to be assessed Goal status: INITIAL  3.  Pt will negotiate steps in pool area with hand rails with CGA using step by step sequence.  Baseline:  to be assessed  Goal status:  INITIAL   ASSESSMENT:  CLINICAL IMPRESSION: PT session focused on gait and balance training in 4.5- 4.8' water depth.  Pt able to  perform alternating tap ups to aquatic step with intermittent RUE support on pool wall and also with CGA.  Pt continues to need cues to decrease speed with movements to increase challenge with balance and also to facilitate improved SLS on each leg.  Cont with POC.    OBJECTIVE IMPAIRMENTS Abnormal gait, decreased activity tolerance, decreased balance, decreased coordination, decreased knowledge of use of DME, difficulty walking, decreased strength, impaired sensation, and impaired vision/preception.   ACTIVITY LIMITATIONS carrying,  lifting, bending, standing, squatting, stairs, transfers, and dressing  PARTICIPATION LIMITATIONS: meal prep, cleaning, laundry, driving, shopping, and community activity  PERSONAL FACTORS Behavior pattern, Past/current experiences, and Time since onset of injury/illness/exacerbation are also affecting patient's functional outcome.   REHAB POTENTIAL: Fair due to chronicity of deficits & time since onset  CLINICAL DECISION MAKING: Evolving/moderate complexity  EVALUATION COMPLEXITY: Moderate  PLAN: PT FREQUENCY: 1x/week  PT DURATION: 8 weeks  PLANNED INTERVENTIONS: Patient/Family education, Self Care, and Aquatic Therapy  PLAN FOR NEXT SESSION: aquatic exercise only per pt's request   Kary Kos, PT 06/17/2022, 8:30 PM

## 2022-06-27 NOTE — Progress Notes (Signed)
[facility]   MULTIDISCIPLINARY HEART VALVE CLINIC                                     Cardiology Office Note:    Date:  06/28/2022   ID:  Walter [name], DOB January 26, 1956, MRN [id number]  PCP:  Walter [name], Walter Saucer, MD  [facility] Cardiologist:  None  [facility] Electrophysiologist:  Walter Prude, MD   Referring MD: Walter [name], Walter Ewings.Walter Saucer, MD   Discuss upcoming LAAO with Watchman 07/11/22  History of Present Illness:    Walter [name] is a 66 y.o. male with a hx of HTN, paroxysmal atrial fib/flutter s/p ablation, DVT/PE, spontaneous intracranial hemorrhage 08/2020, and cardio embolic CVA who presents to clinic for follow up.   Walter [name] was diagnosed with AF 08/2020. He presented to [facility] 09/25/20 after awaking from sleep with slurred speech and HA. On ED arrival he was intubated due to lethargy. Emergent head CT was performed which showed a left midbrain hemorrhage involving the dorsal part of the pons as well with extension into the cerebral aqueduct and fourth ventricle with local mass-effect on the brainstem but no hydrocephalus. CT angiogram of the head and neck showed no large vessel occlusion or AV malformation. He was started on hypertonic saline and had repeat CT scan which showed worsening hydrocephalus and neurosurgery was consulted who placed a right frontal ventriculostomy catheter. MRI scan of the brain was obtained which confirmed acute hemorrhage within the left dorsal midbrain and pons extending to inferior medial thalamus with intraventricular extension. After EVD placement, he showed neurological improvement. Hospital course was c/b a DVT and PE requiring anticoagulation. He also required a tracheostomy and IVC filter which has since been removed.    He was transferred to to inpatient rehab on 10/26/21 where he stayed 4 weeks and went home 11/23/20. In last follow up with Dr. Pearlean Brownie 09/2021, he was felt to be a good candidate for Watchman implant. He was referred  for LAAO by Dr. Ladona Ridgel and seen by Dr. Lalla Brothers. He was scheduled for LAAO closure 02/28/22. Unfortunately, procedure attempted however case was aborted after echodensity seen on TEE with concern for possible LAAO thrombus. He was restarted on Eliquis with initial plan to follow with a repeat TEE scheduled for 10/4. This was later cancelled due to patient concern of swallowing issues. CT not felt to give great quality images based on the size of the echodensity. Plan changed to re attempt LAAO with a TEE on the table prior to the procedure. This was scheduled for 07/11/22. They are fully aware of the possibility of needing to cancel once again if persistent thrombus seen.   Today the patient presents to clinic for follow up. Here with wife. No CP or SOB. No LE edema, orthopnea or PND. No dizziness or syncope. No blood in stool or urine. No palpitations.   Past Medical History:  Diagnosis Date   Atrial flutter [facility])    s/p ablation   DVT (deep venous thrombosis) (HCC)    Near syncope 09/25/2013   Paroxysmal atrial fibrillation (HCC)    Stroke [facility])    initially hemorragic (not on OAC) followed by subsequent embolic stroke    Past Surgical History:  Procedure Laterality Date   A-FLUTTER ABLATION N/A 03/09/2020   Procedure: A-FLUTTER ABLATION;  Surgeon: Hillis Range, MD;  Location: [facility] INVASIVE CV LAB;  Service: Cardiovascular;  Laterality: N/A;  CARDIOVERSION N/A 09/27/2013   Procedure: CARDIOVERSION;  Surgeon: Lewayne Bunting, MD;  Location: [facility] ENDOSCOPY;  Service: Cardiovascular;  Laterality: N/A;   IR ANGIO EXTERNAL CAROTID SEL EXT CAROTID UNI R MOD SED  09/26/2020   IR ANGIO INTRA EXTRACRAN SEL COM CAROTID INNOMINATE BILAT MOD SED  09/26/2020   IR ANGIO VERTEBRAL SEL VERTEBRAL UNI R MOD SED  09/26/2020   IR IVC FILTER PLMT / S&I /IMG GUID/MOD SED  10/08/2020   IR IVC FILTER RETRIEVAL / S&I /IMG GUID/MOD SED  09/28/2021   IR IVUS EACH ADDITIONAL NON CORONARY VESSEL  11/02/2020   IR PTA VENOUS EXCEPT  DIALYSIS CIRCUIT  11/02/2020   IR RADIOLOGIST EVAL & MGMT  01/17/2021   IR RADIOLOGIST EVAL & MGMT  09/12/2021   IR THROMBECT VENO MECH MOD SED  10/27/2020   IR THROMBECT VENO MECH MOD SED  11/02/2020   IR US GUIDE VASC ACCESS LEFT  10/27/2020   IR US GUIDE VASC ACCESS RIGHT  09/26/2020   IR US GUIDE VASC ACCESS RIGHT  11/02/2020   IR VENO/EXT/UNI RIGHT  11/02/2020   IR VENOCAVAGRAM IVC  10/27/2020   LEFT ATRIAL APPENDAGE OCCLUSION N/A 02/28/2022   Procedure: LEFT ATRIAL APPENDAGE OCCLUSION;  Surgeon: Walter Prude, MD;  Location: [facility] INVASIVE CV LAB;  Service: Cardiovascular;  Laterality: N/A;   RETINAL DETACHMENT SURGERY     right knee arthroscopy     TEE WITHOUT CARDIOVERSION N/A 09/27/2013   Procedure: TRANSESOPHAGEAL ECHOCARDIOGRAM (TEE);  Surgeon: Lewayne Bunting, MD;  Location: [facility] ENDOSCOPY;  Service: Cardiovascular;  Laterality: N/A;   TEE WITHOUT CARDIOVERSION N/A 02/28/2022   Procedure: TRANSESOPHAGEAL ECHOCARDIOGRAM (TEE);  Surgeon: Walter Prude, MD;  Location: [facility] INVASIVE CV LAB;  Service: Cardiovascular;  Laterality: N/A;   WRIST SURGERY      Current Medications: Current Meds  Medication Sig   atorvastatin (LIPITOR) 20 MG tablet Take 1 tablet (20 mg total) by mouth at bedtime.   DILT-XR 180 MG 24 hr capsule Take 180 mg by mouth daily.   ELIQUIS 5 MG TABS tablet TAKE 1 TABLET BY MOUTH TWICE A DAY   EPINEPHrine 0.3 mg/0.3 mL IJ SOAJ injection Inject 0.3 mg into the muscle as needed for anaphylaxis.   mirtazapine (REMERON) 7.5 MG tablet TAKE 2 TABLETS BY MOUTH AT BEDTIME   sertraline (ZOLOFT) 100 MG tablet Take 100 mg by mouth at bedtime.   VENTOLIN HFA 108 (90 Base) MCG/ACT inhaler Inhale 1-2 puffs into the lungs as needed for wheezing or shortness of breath.   Current Facility-Administered Medications for the 06/28/22 encounter (Office Visit) with [facility]  Medication   0.9 %  sodium chloride infusion   0.9 %  sodium chloride infusion     Allergies:    Keppra [levetiracetam] and Latex   Social History   Socioeconomic History   Marital status: Married    Spouse name: cathy   Number of children: Not on file   Years of education: Not on file   Highest education level: Not on file  Occupational History   Not on file  Tobacco Use   Smoking status: Never   Smokeless tobacco: Never  Vaping Use   Vaping Use: Never used  Substance and Sexual Activity   Alcohol use: Yes    Comment: occasionally   Drug use: No   Sexual activity: Not on file  Other Topics Concern   Not on file  Social History Narrative   Lives with wife at  home   Right Handed   Drinks decaf   Social Determinants of Health   Financial Resource Strain: Not on file  Food Insecurity: Not on file  Transportation Needs: Not on file  Physical Activity: Not on file  Stress: Not on file  Social Connections: Not on file     Family History: The patient's family history includes Healthy in his brother and sister; Heart disease in his father and mother.  ROS:   Please see the history of present illness.    All other systems reviewed and are negative.  EKGs/Labs/Other Studies Reviewed:    The following studies were reviewed today:  TEE 02/28/22 IMPRESSIONS   1. Large wind sock appendage with landing zone diameter 24 mm suggesting  31 mm FLX device for procedure However patient develooped LAA thrombus and  case aborted.   2. Left ventricular ejection fraction, by estimation, is 55 to 60%. The  left ventricle has normal function.   3. Right ventricular systolic function is normal. The right ventricular  size is normal.   4. Post transeptal with delivery sheath and pigtail in LA appeared to be  a new thrombus formed in mid LAA despite Rx ACT Procedure aborted . Left  atrial size was mildly dilated. A left atrial/left atrial appendage  thrombus was detected.   5. Right atrial size was moderately dilated.   6. No change in effusion post procedure.   7. The mitral  valve is abnormal. Mild mitral valve regurgitation.   8. The aortic valve is tricuspid. Aortic valve regurgitation is not  visualized.   9. Small left to right shunt post transeptal.    EKG:  EKG is  ordered today.  The ekg ordered today demonstrates NSR, HR 73  Recent Labs: 08/15/2021: Pro B Natriuretic peptide (BNP) 32.0; TSH 1.92 02/11/2022: BUN 13; Creatinine, Ser 0.98; Hemoglobin 15.1; Platelets 248; Potassium 4.6; Sodium 141  Recent Lipid Panel    Component Value Date/Time   CHOL 155 09/25/2020 1659   TRIG 117 09/25/2020 1659   HDL 44 09/25/2020 1659   CHOLHDL 3.5 09/25/2020 1659   VLDL 23 09/25/2020 1659   LDLCALC 88 09/25/2020 1659     Risk Assessment/Calculations:    HAS-BLED score 4 Hypertension Yes  Abnormal renal and liver function (Dialysis, transplant, Cr >2.26 mg/dL /Cirrhosis or Bilirubin >2x Normal or AST/ALT/AP >3x Normal) No  Stroke Yes  Bleeding Yes  Labile INR (Unstable/high INR) No  Elderly (>65) Yes  Drugs or alcohol (? 8 drinks/week, anti-plt or NSAID) No   CHA2DS2-VASc Score = 4   This indicates a 4.8% annual risk of stroke. The patient's score is based upon: CHF History: 0 HTN History: 1 Diabetes History: 0 Stroke History: 2 Vascular Disease History: 0 Age Score: 1 Gender Score: 0      Physical Exam:    VS:  BP 118/72   Pulse 73   Ht 6' (1.829 m)   Wt 225 lb (102.1 kg)   SpO2 93%   BMI 30.52 kg/m     Wt Readings from Last 3 Encounters:  06/28/22 225 lb (102.1 kg)  03/27/22 225 lb (102.1 kg)  02/28/22 225 lb (102.1 kg)     GEN:  Well nourished, well developed in no acute distress HEENT: Normal NECK: No JVD LYMPHATICS: No lymphadenopathy CARDIAC: RRR, no murmurs, rubs, gallops RESPIRATORY:  Clear to auscultation without rales, wheezing or rhonchi  ABDOMEN: Soft, non-tender, non-distended MUSCULOSKELETAL:  No edema; No deformity  SKIN: Warm and dry  NEUROLOGIC:  Alert and oriented x 3 PSYCHIATRIC:  Normal affect    ASSESSMENT:    1. PAF (paroxysmal atrial fibrillation) (HCC)   2. Intracranial hemorrhage (HCC)    PLAN:    In order of problems listed above:  PAF with history of intracranial hemorrhage: s/p aborted Watchman in Walter after echodensity seen on TEE with concern for possible LAAO thrombus. He was restarted on Eliquis with initial plan to follow with a repeat TEE scheduled for 10/4. This was later cancelled due to patient concern of swallowing issues. CT not felt to give great quality images based on the size of the echodensity. Plan changed to re attempt LAAO with a TEE on the table prior to the procedure. This was scheduled for 07/11/22. They are fully aware of the possibility of needing to cancel once again if persistent thrombus seen. ECG and labs done today. Pt given instruction letter and soap for surgery.   Medication Adjustments/Labs and Tests Ordered: Current medicines are reviewed at length with the patient today.  Concerns regarding medicines are outlined above.  Orders Placed This Encounter  Procedures   Basic metabolic panel   CBC   EKG 12-Lead   No orders of the defined types were placed in this encounter.   Patient Instructions  Medication Instructions:  Your physician recommends that you continue on your current medications as directed. Please refer to the Current Medication list given to you today.  *If you need a refill on your cardiac medications before your next appointment, please call your pharmacy*   Lab Work: TODAY: CBC, BMP If you have labs (blood work) drawn today and your tests are completely normal, you will receive your results only by: MyChart Message (if you have MyChart) OR A paper copy in the mail If you have any lab test that is abnormal or we need to change your treatment, we will call you to review the results.   Testing/Procedures: NONE    Follow-Up: As scheduled  At [facility], you and your health needs are our priority.  As  part of our continuing mission to provide you with exceptional heart care, we have created designated Provider Care Teams.  These Care Teams include your primary Cardiologist (physician) and Advanced Practice Providers (APPs -  Physician Assistants and Nurse Practitioners) who all work together to provide you with the care you need, when you need it.   Important Information About Sugar         Signed, Cline Crock, PA-C  06/28/2022 12:24 PM    [facility]

## 2022-06-27 NOTE — Telephone Encounter (Signed)
Spoke with the patient's wife 12/14 and scheduled pre-Watchman visit 06/19/2023.

## 2022-06-28 ENCOUNTER — Ambulatory Visit: Payer: Medicare Other | Attending: Physician Assistant | Admitting: Physician Assistant

## 2022-06-28 VITALS — BP 118/72 | HR 73 | Ht 72.0 in | Wt 225.0 lb

## 2022-06-28 DIAGNOSIS — I480 Paroxysmal atrial fibrillation: Secondary | ICD-10-CM | POA: Diagnosis not present

## 2022-06-28 DIAGNOSIS — I629 Nontraumatic intracranial hemorrhage, unspecified: Secondary | ICD-10-CM

## 2022-06-28 DIAGNOSIS — I639 Cerebral infarction, unspecified: Secondary | ICD-10-CM | POA: Diagnosis not present

## 2022-06-28 NOTE — Patient Instructions (Signed)
Medication Instructions:  Your physician recommends that you continue on your current medications as directed. Please refer to the Current Medication list given to you today.  *If you need a refill on your cardiac medications before your next appointment, please call your pharmacy*   Lab Work: TODAY: CBC, BMP If you have labs (blood work) drawn today and your tests are completely normal, you will receive your results only by: MyChart Message (if you have MyChart) OR A paper copy in the mail If you have any lab test that is abnormal or we need to change your treatment, we will call you to review the results.   Testing/Procedures: NONE    Follow-Up: As scheduled  At [facility], you and your health needs are our priority.  As part of our continuing mission to provide you with exceptional heart care, we have created designated Provider Care Teams.  These Care Teams include your primary Cardiologist (physician) and Advanced Practice Providers (APPs -  Physician Assistants and Nurse Practitioners) who all work together to provide you with the care you need, when you need it.   Important Information About Sugar

## 2022-06-29 LAB — CBC
Hematocrit: 47.5 % (ref 37.5–51.0)
Hemoglobin: 15.7 g/dL (ref 13.0–17.7)
MCH: 31.4 pg (ref 26.6–33.0)
MCHC: 33.1 g/dL (ref 31.5–35.7)
MCV: 95 fL (ref 79–97)
Platelets: 223 10*3/uL (ref 150–450)
RBC: 5 x10E6/uL (ref 4.14–5.80)
RDW: 12.8 % (ref 11.6–15.4)
WBC: 5.3 10*3/uL (ref 3.4–10.8)

## 2022-06-29 LAB — BASIC METABOLIC PANEL
BUN/Creatinine Ratio: 14 (ref 10–24)
BUN: 13 mg/dL (ref 8–27)
CO2: 23 mmol/L (ref 20–29)
Calcium: 9.7 mg/dL (ref 8.6–10.2)
Chloride: 104 mmol/L (ref 96–106)
Creatinine, Ser: 0.92 mg/dL (ref 0.76–1.27)
Glucose: 96 mg/dL (ref 70–99)
Potassium: 4.4 mmol/L (ref 3.5–5.2)
Sodium: 143 mmol/L (ref 134–144)
eGFR: 92 mL/min/{1.73_m2} (ref >59)

## 2022-07-08 ENCOUNTER — Ambulatory Visit: Payer: Medicare Other | Attending: Family Medicine | Admitting: Physical Therapy

## 2022-07-08 DIAGNOSIS — R2689 Other abnormalities of gait and mobility: Secondary | ICD-10-CM | POA: Diagnosis not present

## 2022-07-08 DIAGNOSIS — R2681 Unsteadiness on feet: Secondary | ICD-10-CM | POA: Insufficient documentation

## 2022-07-08 DIAGNOSIS — R278 Other lack of coordination: Secondary | ICD-10-CM

## 2022-07-09 ENCOUNTER — Encounter: Payer: Self-pay | Admitting: Physical Therapy

## 2022-07-09 NOTE — Therapy (Signed)
OUTPATIENT PHYSICAL THERAPY NEURO TREATMENT NOTE   Patient Name: Walter [name] MRN: [id number] DOB:1955-10-17, 67 y.o., male Today's Date: 07/09/2022   PCP: Clovis Riley, L. August Saucer, MD REFERRING PROVIDER: Micki Riley, MD    PT End of Session - 07/09/22 1525     Visit Number 5    Number of Visits 9    Date for PT Re-Evaluation 08/02/22   due to scheduling conflict 1 week and planned surgical procedure in Jan. 2024   Authorization Type Medicare    Authorization Time Period 05-20-22 - 08-18-21    Progress Note Due on Visit 10    PT Start Time 1450    PT Stop Time 1534    PT Time Calculation (min) 44 min    Equipment Utilized During Treatment Other (comment)   bar bell, aquatic step   Activity Tolerance Patient tolerated treatment well    Behavior During Therapy WFL for tasks assessed/performed              Past Medical History:  Diagnosis Date   Atrial flutter (HCC)    s/p ablation   DVT (deep venous thrombosis) (HCC)    Near syncope 09/25/2013   Paroxysmal atrial fibrillation (HCC)    Stroke [facility])    initially hemorragic (not on OAC) followed by subsequent embolic stroke   Past Surgical History:  Procedure Laterality Date   A-FLUTTER ABLATION N/A 03/09/2020   Procedure: A-FLUTTER ABLATION;  Surgeon: Hillis Range, MD;  Location: [facility] INVASIVE CV LAB;  Service: Cardiovascular;  Laterality: N/A;   CARDIOVERSION N/A 09/27/2013   Procedure: CARDIOVERSION;  Surgeon: Lewayne Bunting, MD;  Location: [facility] ENDOSCOPY;  Service: Cardiovascular;  Laterality: N/A;   IR ANGIO EXTERNAL CAROTID SEL EXT CAROTID UNI R MOD SED  09/26/2020   IR ANGIO INTRA EXTRACRAN SEL COM CAROTID INNOMINATE BILAT MOD SED  09/26/2020   IR ANGIO VERTEBRAL SEL VERTEBRAL UNI R MOD SED  09/26/2020   IR IVC FILTER PLMT / S&I /IMG GUID/MOD SED  10/08/2020   IR IVC FILTER RETRIEVAL / S&I /IMG GUID/MOD SED  09/28/2021   IR IVUS EACH ADDITIONAL NON CORONARY VESSEL  11/02/2020   IR PTA VENOUS EXCEPT DIALYSIS CIRCUIT   11/02/2020   IR RADIOLOGIST EVAL & MGMT  01/17/2021   IR RADIOLOGIST EVAL & MGMT  09/12/2021   IR THROMBECT VENO MECH MOD SED  10/27/2020   IR THROMBECT VENO MECH MOD SED  11/02/2020   IR US GUIDE VASC ACCESS LEFT  10/27/2020   IR US GUIDE VASC ACCESS RIGHT  09/26/2020   IR US GUIDE VASC ACCESS RIGHT  11/02/2020   IR VENO/EXT/UNI RIGHT  11/02/2020   IR VENOCAVAGRAM IVC  10/27/2020   LEFT ATRIAL APPENDAGE OCCLUSION N/A 02/28/2022   Procedure: LEFT ATRIAL APPENDAGE OCCLUSION;  Surgeon: Lanier Prude, MD;  Location: [facility] INVASIVE CV LAB;  Service: Cardiovascular;  Laterality: N/A;   RETINAL DETACHMENT SURGERY     right knee arthroscopy     TEE WITHOUT CARDIOVERSION N/A 09/27/2013   Procedure: TRANSESOPHAGEAL ECHOCARDIOGRAM (TEE);  Surgeon: Lewayne Bunting, MD;  Location: [facility] ENDOSCOPY;  Service: Cardiovascular;  Laterality: N/A;   TEE WITHOUT CARDIOVERSION N/A 02/28/2022   Procedure: TRANSESOPHAGEAL ECHOCARDIOGRAM (TEE);  Surgeon: Lanier Prude, MD;  Location: [facility] INVASIVE CV LAB;  Service: Cardiovascular;  Laterality: N/A;   WRIST SURGERY     Patient Active Problem List   Diagnosis Date Noted   Hypertension 08/16/2021   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   Residual cognitive  deficit as late effect of stroke 04/19/2021   Hemiparesis affecting right side as late effect of stroke (HCC) 04/19/2021   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 04/19/2021   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 (HCC) 04/19/2021   Thrombosis    Sleep disturbance    Dysphagia, post-stroke    PAF (paroxysmal atrial fibrillation) (HCC)    Acute pulmonary embolism without acute cor pulmonale (HCC)    Malnutrition of moderate degree 11/04/2020   Tracheostomy care (HCC)    Pressure injury of skin 10/23/2020   Intracranial hemorrhage (HCC)    Atrial fibrillation (HCC)    Prediabetes    Leukocytosis    Acute blood loss anemia    Hypernatremia    ICH (intracerebral hemorrhage) (HCC) 09/25/2020    Unilateral primary osteoarthritis, left knee 09/26/2016   Atrial flutter (HCC) 09/25/2013   Near syncope 09/25/2013    ONSET DATE: 09-25-20:  Referral date 04-16-22  REFERRING DIAG: I69.90 (ICD-10-CM) - Late, effect, cerebrovascular disease I69.393 (ICD-10-CM) - Ataxia due to old cerebrovascular accident (CVA)  THERAPY DIAG:  Other abnormalities of gait and mobility  Other lack of coordination  Unsteadiness on feet  Rationale for Evaluation and Treatment Rehabilitation  SUBJECTIVE:                                                                                                                                                                                              SUBJECTIVE STATEMENT: Pt presents for aquatic therapy - no changes reported;  pt is scheduled to have Watchman implant procedure done on 07-11-22 (this week)  Pt accompanied by: Tresa Endo (PCA)   PERTINENT HISTORY: Atrial flutter (HCC)s/p ablation DVT (deep venous thrombosis) (HCC) Near syncope03/28/2015 Paroxysmal atrial fibrillation (HCC) Stroke (HCC)initially hemorragic (not on OAC) followed by subsequent embolic stroke; has Lt hemiataxia and sensory deficits on Rt side of body; also 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 No  PRECAUTIONS: Fall (HIGH fall risk)  WEIGHT BEARING RESTRICTIONS No  FALLS: Has patient fallen in last 6 months? Yes. Number of falls approx. 14  falls   LIVING ENVIRONMENT: Lives with: lives with their spouse Lives in: apartment while house is being built - pt states house is supposed to be finished in Dec. '23 Stairs: No steps currently at apartment Has following equipment at home: Dan Humphreys - 2 wheeled, Tour manager, and Grab bars,manual wheelchair; pt reports he is going to order a [employer] states they are building new house with 9" slope differential to enter home - is supposed to be ready in Dec.  PLOF: Independent prior to CVA in March  2022  PATIENT GOALS "Improve balance as much as possible"; be able to exercise safely without fall risk    OBJECTIVE:    Aquatic therapy at [facility] - pool temp 92 degrees  Patient seen for aquatic therapy today.  Treatment took place in water 3.6-4.8 feet deep depending upon activity.  Pt entered and exited the the pool via step negotiation with use of bilateral hand rails with CGA to SBA using step by step sequence.  Pt performed water walking for warm up - forwards, backwards and sideways 18' x 4 reps each direction across width of pool  Pt performed marching in place with cues to perform slowly and to tighten core for improved stabilization - 10 reps each leg - no UE support used  Pt performed hip kicks - unilateral LE's with CGA and with use of pool edge prn for assist with balance recovery - forward, back and side kicks 10 reps each direction   Stepping strategy - forward and side stepping 10 reps each leg with min assist for balance recovery;   Pt performed static standing for core stabilization - moved bar bells in horizontal abduction/adduction and forward/back (protraction/retraction) 10 reps each direction with min assist intermittently for balance recovery  Tall kneeling on aquatic step on pool floor - pt performed mini squats 10 reps; hip extension and hip abduction each leg in tall kneeling 10 reps each with cues and CGA for upright posture and balance recovery  Jumping straight up/down 10 reps with CGA but without UE support; 1/2 jumping jacks 10 reps with CGA to min assist for balance recovery  Pt requires buoyancy of water for support for reduced fall risk with balance exercises in aquatic environment;  exercises able to be performed safely in water compared to that on land with higher fall risk;  current of water provides perturbations for challenges with static & dynamic standing balace.  Pt also requires viscosity of water for strengthening bil. lower extremities and  trunk musculature for improved core stabilization.       GOALS: Goals reviewed with patient? Yes  SHORT TERM GOALS: Target date: 06-21-22  Initiate HEP for aquatic exercises.  Baseline: dependent Goal status: Goal met 07-08-22  2.  Pt will amb. 10" in pool in 4' water depth without UE support on floatation device with SBA for improved endurance/activity tolerance.  Baseline: to be assessed Goal status:  Goal met 07-08-22  3. Pt will demonstrate improved balance by standing with feet </= 6" apart for 2" in 4' water depth without LOB.  Baseline:  feet 14" apart at eval on 05-20-22 with SBA - pt stood for 2"   Goal status:  Goal met 07-08-22  4.  Pt will negotiate steps in pool for entering and exiting with use of hand rails with min assist. Baseline: to be assessed Goal status: Goal met 07-08-22     LONG TERM GOALS:  Target date:  07-26-22  Pt will perform updated HEP for aquatic exercises with assist from family/caregiver as needed for safety. Baseline:  Goal status: INITIAL  2.  Pt will amb. 15" nonstop in pool in 4' water depth with supervision without LOB for increased endurance/activity tolerance.  Baseline: to be assessed Goal status: INITIAL  3.  Pt will negotiate steps in pool area with hand rails with CGA using step by step sequence.  Baseline:  to be assessed  Goal status:  INITIAL   ASSESSMENT:  CLINICAL IMPRESSION:  Pt has met 4/4 STG's:  Pt is able to amb. In 4.5' water depth without UE support but continues to have intermittent LOB  with pt requiring min assist to recover.  Pt continues to need cues to decrease speed with movements to increase challenge with balance and also to facilitate improved SLS on each leg.  Cont with POC.    OBJECTIVE IMPAIRMENTS Abnormal gait, decreased activity tolerance, decreased balance, decreased coordination, decreased knowledge of use of DME, difficulty walking, decreased strength, impaired sensation, and impaired vision/preception.    ACTIVITY LIMITATIONS carrying, lifting, bending, standing, squatting, stairs, transfers, and dressing  PARTICIPATION LIMITATIONS: meal prep, cleaning, laundry, driving, shopping, and community activity  PERSONAL FACTORS Behavior pattern, Past/current experiences, and Time since onset of injury/illness/exacerbation are also affecting patient's functional outcome.   REHAB POTENTIAL: Fair due to chronicity of deficits & time since onset  CLINICAL DECISION MAKING: Evolving/moderate complexity  EVALUATION COMPLEXITY: Moderate  PLAN: PT FREQUENCY: 1x/week  PT DURATION: 8 weeks  PLANNED INTERVENTIONS: Patient/Family education, Self Care, and Aquatic Therapy  PLAN FOR NEXT SESSION: aquatic exercise only per pt's request   Alda Lea, PT 07/09/2022, 3:27 PM

## 2022-07-10 ENCOUNTER — Telehealth: Payer: Self-pay

## 2022-07-10 NOTE — Telephone Encounter (Addendum)
Confirmed procedure date of 07/11/2022. Confirmed NEW arrival time of 0700 for procedure time at 0930 (the patient was previously scheduled at 1130 with an 0900 arrival time). Reviewed pre-procedure instructions with patient. The patient understands to call if questions/concerns arise prior to procedure.

## 2022-07-11 ENCOUNTER — Encounter (HOSPITAL_COMMUNITY): Payer: Self-pay | Admitting: Cardiology

## 2022-07-11 ENCOUNTER — Other Ambulatory Visit: Payer: Self-pay

## 2022-07-11 ENCOUNTER — Inpatient Hospital Stay (HOSPITAL_COMMUNITY): Payer: Medicare Other

## 2022-07-11 ENCOUNTER — Encounter (HOSPITAL_COMMUNITY)
Admission: RE | Disposition: A | Discharge status: Home / Self Care | Payer: Self-pay | Source: Ambulatory Visit | Attending: Cardiology

## 2022-07-11 ENCOUNTER — Inpatient Hospital Stay (HOSPITAL_COMMUNITY)
Admission: RE | Admit: 2022-07-11 | Discharge: 2022-07-11 | DRG: 274 | Disposition: A | Discharge status: Home / Self Care | Payer: Medicare Other | Source: Ambulatory Visit | Attending: Cardiology | Admitting: Cardiology

## 2022-07-11 ENCOUNTER — Inpatient Hospital Stay (HOSPITAL_COMMUNITY): Payer: Medicare Other | Admitting: Anesthesiology

## 2022-07-11 DIAGNOSIS — Z91040 Latex allergy status: Secondary | ICD-10-CM

## 2022-07-11 DIAGNOSIS — Z7901 Long term (current) use of anticoagulants: Secondary | ICD-10-CM | POA: Diagnosis not present

## 2022-07-11 DIAGNOSIS — Z888 Allergy status to other drugs, medicaments and biological substances status: Secondary | ICD-10-CM | POA: Diagnosis not present

## 2022-07-11 DIAGNOSIS — I69251 Hemiplegia and hemiparesis following other nontraumatic intracranial hemorrhage affecting right dominant side: Secondary | ICD-10-CM | POA: Diagnosis not present

## 2022-07-11 DIAGNOSIS — I4819 Other persistent atrial fibrillation: Secondary | ICD-10-CM | POA: Diagnosis present

## 2022-07-11 DIAGNOSIS — Z006 Encounter for examination for normal comparison and control in clinical research program: Secondary | ICD-10-CM

## 2022-07-11 DIAGNOSIS — M1990 Unspecified osteoarthritis, unspecified site: Secondary | ICD-10-CM

## 2022-07-11 DIAGNOSIS — I483 Typical atrial flutter: Secondary | ICD-10-CM | POA: Diagnosis present

## 2022-07-11 DIAGNOSIS — D759 Disease of blood and blood-forming organs, unspecified: Secondary | ICD-10-CM | POA: Diagnosis not present

## 2022-07-11 DIAGNOSIS — I619 Nontraumatic intracerebral hemorrhage, unspecified: Secondary | ICD-10-CM | POA: Diagnosis present

## 2022-07-11 DIAGNOSIS — I6990 Unspecified sequelae of unspecified cerebrovascular disease: Secondary | ICD-10-CM | POA: Diagnosis not present

## 2022-07-11 DIAGNOSIS — I4891 Unspecified atrial fibrillation: Secondary | ICD-10-CM

## 2022-07-11 DIAGNOSIS — Z95818 Presence of other cardiac implants and grafts: Secondary | ICD-10-CM

## 2022-07-11 DIAGNOSIS — I10 Essential (primary) hypertension: Secondary | ICD-10-CM | POA: Diagnosis present

## 2022-07-11 DIAGNOSIS — Z993 Dependence on wheelchair: Secondary | ICD-10-CM | POA: Diagnosis not present

## 2022-07-11 DIAGNOSIS — Z79899 Other long term (current) drug therapy: Secondary | ICD-10-CM

## 2022-07-11 DIAGNOSIS — I480 Paroxysmal atrial fibrillation: Secondary | ICD-10-CM

## 2022-07-11 DIAGNOSIS — Z8249 Family history of ischemic heart disease and other diseases of the circulatory system: Secondary | ICD-10-CM

## 2022-07-11 DIAGNOSIS — I69293 Ataxia following other nontraumatic intracranial hemorrhage: Secondary | ICD-10-CM

## 2022-07-11 DIAGNOSIS — I69351 Hemiplegia and hemiparesis following cerebral infarction affecting right dominant side: Secondary | ICD-10-CM

## 2022-07-11 HISTORY — PX: TEE WITHOUT CARDIOVERSION: SHX5443

## 2022-07-11 HISTORY — DX: Presence of other cardiac implants and grafts: Z95.818

## 2022-07-11 HISTORY — PX: LEFT ATRIAL APPENDAGE OCCLUSION: EP1229

## 2022-07-11 LAB — TYPE AND SCREEN
ABO/RH(D): O POS
Antibody Screen: NEGATIVE

## 2022-07-11 LAB — SURGICAL PCR SCREEN
MRSA, PCR: NEGATIVE
Staphylococcus aureus: NEGATIVE

## 2022-07-11 LAB — ECHO TEE

## 2022-07-11 LAB — POCT ACTIVATED CLOTTING TIME: Activated Clotting Time: 336 seconds

## 2022-07-11 SURGERY — LEFT ATRIAL APPENDAGE OCCLUSION
Anesthesia: General

## 2022-07-11 MED ORDER — LIDOCAINE 2% (20 MG/ML) 5 ML SYRINGE
INTRAMUSCULAR | Status: DC | PRN
  Administered 2022-07-11: 60 mg via INTRAVENOUS

## 2022-07-11 MED ORDER — CHLORHEXIDINE GLUCONATE 0.12 % MT SOLN
OROMUCOSAL | Status: AC
  Administered 2022-07-11: 15 mL
  Filled 2022-07-11: qty 15

## 2022-07-11 MED ORDER — PROPOFOL 10 MG/ML IV BOLUS
INTRAVENOUS | Status: DC | PRN
  Administered 2022-07-11: 50 mg via INTRAVENOUS
  Administered 2022-07-11: 100 mg via INTRAVENOUS

## 2022-07-11 MED ORDER — PHENYLEPHRINE HCL-NACL 20-0.9 MG/250ML-% IV SOLN
INTRAVENOUS | Status: DC | PRN
  Administered 2022-07-11: 35 ug/min via INTRAVENOUS

## 2022-07-11 MED ORDER — HEPARIN (PORCINE) IN NACL 2000-0.9 UNIT/L-% IV SOLN
INTRAVENOUS | Status: DC | PRN
  Administered 2022-07-11: 1000 mL

## 2022-07-11 MED ORDER — SODIUM CHLORIDE 0.9% FLUSH: 3.0000 mL | INTRAVENOUS | Status: DC | PRN

## 2022-07-11 MED ORDER — HEPARIN SODIUM (PORCINE) 1000 UNIT/ML IJ SOLN
INTRAMUSCULAR | Status: DC | PRN
  Administered 2022-07-11: 16000 [IU] via INTRAVENOUS

## 2022-07-11 MED ORDER — MIDAZOLAM HCL 2 MG/2ML IJ SOLN
INTRAMUSCULAR | Status: AC
  Filled 2022-07-11: qty 2

## 2022-07-11 MED ORDER — ROCURONIUM BROMIDE 10 MG/ML (PF) SYRINGE
PREFILLED_SYRINGE | INTRAVENOUS | Status: DC | PRN
  Administered 2022-07-11: 10 mg via INTRAVENOUS
  Administered 2022-07-11: 50 mg via INTRAVENOUS
  Administered 2022-07-11: 30 mg via INTRAVENOUS

## 2022-07-11 MED ORDER — MIDAZOLAM HCL 2 MG/2ML IJ SOLN
INTRAMUSCULAR | Status: DC | PRN
  Administered 2022-07-11: 1 mg via INTRAVENOUS

## 2022-07-11 MED ORDER — ACETAMINOPHEN 325 MG PO TABS: 650.0000 mg | ORAL_TABLET | ORAL | Status: DC | PRN

## 2022-07-11 MED ORDER — EPHEDRINE SULFATE (PRESSORS) 50 MG/ML IJ SOLN
INTRAMUSCULAR | Status: DC | PRN
  Administered 2022-07-11: 5 mg via INTRAVENOUS

## 2022-07-11 MED ORDER — PERFLUTREN LIPID MICROSPHERE
1.0000 mL | INTRAVENOUS | Status: AC | PRN
  Administered 2022-07-11: 3 mL via INTRAVENOUS

## 2022-07-11 MED ORDER — SUGAMMADEX SODIUM 200 MG/2ML IV SOLN
INTRAVENOUS | Status: DC | PRN
  Administered 2022-07-11: 200 mg via INTRAVENOUS

## 2022-07-11 MED ORDER — HEPARIN (PORCINE) IN NACL 1000-0.9 UT/500ML-% IV SOLN
INTRAVENOUS | Status: DC | PRN
  Administered 2022-07-11: 500 mL

## 2022-07-11 MED ORDER — LACTATED RINGERS IV SOLN: INTRAVENOUS | Status: DC

## 2022-07-11 MED ORDER — FENTANYL CITRATE (PF) 250 MCG/5ML IJ SOLN
INTRAMUSCULAR | Status: DC | PRN
  Administered 2022-07-11: 100 ug via INTRAVENOUS

## 2022-07-11 MED ORDER — PROTAMINE SULFATE 10 MG/ML IV SOLN
INTRAVENOUS | Status: DC | PRN
  Administered 2022-07-11: 30 mg via INTRAVENOUS

## 2022-07-11 MED ORDER — FENTANYL CITRATE (PF) 100 MCG/2ML IJ SOLN
INTRAMUSCULAR | Status: AC
  Filled 2022-07-11: qty 2

## 2022-07-11 MED ORDER — CEFAZOLIN SODIUM-DEXTROSE 2-4 GM/100ML-% IV SOLN
2.0000 g | INTRAVENOUS | Status: AC
  Administered 2022-07-11: 2 g via INTRAVENOUS
  Filled 2022-07-11: qty 100

## 2022-07-11 MED ORDER — SODIUM CHLORIDE 0.9% FLUSH: 3.0000 mL | Freq: Two times a day (BID) | INTRAVENOUS | Status: DC

## 2022-07-11 MED ORDER — SODIUM CHLORIDE 0.9 % IV SOLN: INTRAVENOUS | Status: DC

## 2022-07-11 MED ORDER — CHLORHEXIDINE GLUCONATE 4 % EX LIQD: Freq: Once | CUTANEOUS | Status: DC

## 2022-07-11 MED ORDER — ONDANSETRON HCL 4 MG/2ML IJ SOLN
INTRAMUSCULAR | Status: DC | PRN
  Administered 2022-07-11: 4 mg via INTRAVENOUS

## 2022-07-11 MED ORDER — IOHEXOL 350 MG/ML SOLN
INTRAVENOUS | Status: DC | PRN
  Administered 2022-07-11 (×2): 7 mL

## 2022-07-11 MED ORDER — DEXAMETHASONE SODIUM PHOSPHATE 10 MG/ML IJ SOLN
INTRAMUSCULAR | Status: DC | PRN
  Administered 2022-07-11: 5 mg via INTRAVENOUS

## 2022-07-11 MED ORDER — ONDANSETRON HCL 4 MG/2ML IJ SOLN: 4.0000 mg | Freq: Four times a day (QID) | INTRAMUSCULAR | Status: DC | PRN

## 2022-07-11 MED ORDER — SODIUM CHLORIDE 0.9 % IV SOLN: 250.0000 mL | INTRAVENOUS | Status: DC | PRN

## 2022-07-11 SURGICAL SUPPLY — 21 items
BLANKET WARM UNDERBOD FULL ACC (MISCELLANEOUS) ×1 IMPLANT
CATH INFINITI 5FR ANG PIGTAIL (CATHETERS) IMPLANT
CLOSURE PERCLOSE PROSTYLE (VASCULAR PRODUCTS) IMPLANT
DEVICE WATCHMAN FLX PROC (KITS) IMPLANT
DILATOR VESSEL 38 20CM 11FR (INTRODUCER) IMPLANT
KIT HEART LEFT (KITS) ×1 IMPLANT
KIT SHEA VERSACROSS LAAC CONNE (KITS) IMPLANT
MAT PREVALON FULL STRYKER (MISCELLANEOUS) IMPLANT
PACK CARDIAC CATHETERIZATION (CUSTOM PROCEDURE TRAY) ×1 IMPLANT
PAD DEFIB RADIO PHYSIO CONN (PAD) ×1 IMPLANT
SHEATH PERFORMER 16FR 30 (SHEATH) IMPLANT
SHEATH PINNACLE 8F 10CM (SHEATH) IMPLANT
SHEATH PROBE COVER 6X72 (BAG) ×1 IMPLANT
SYS WATCHMAN FXD DBL (SHEATH) ×1
SYSTEM WATCHMAN FXD DBL (SHEATH) IMPLANT
TRANSDUCER W/STOPCOCK (MISCELLANEOUS) ×1 IMPLANT
TUBING CIL FLEX 10 FLL-RA (TUBING) ×1 IMPLANT
WATCHMAN FLX 31 (Prosthesis & Implant Heart) IMPLANT
WATCHMAN FLX PROCEDURE DEVICE (KITS) ×1 IMPLANT
WATCHMAN PROCED TRUSEAL ACCESS (SHEATH) IMPLANT
WIRE HI TORQ VERSACORE-J 145CM (WIRE) IMPLANT

## 2022-07-11 NOTE — Anesthesia Preprocedure Evaluation (Addendum)
Anesthesia Evaluation  Patient identified by MRN, date of birth, ID band Patient awake    Reviewed: Allergy & Precautions, NPO status , Patient's Chart, lab work & pertinent test results  Airway Mallampati: III  TM Distance: >3 FB Neck ROM: Full    Dental no notable dental hx.    Pulmonary PE S/p tracheostomy   Pulmonary exam normal        Cardiovascular Normal cardiovascular exam+ dysrhythmias Atrial Fibrillation      Neuro/Psych Ataxia Hemiparesis affecting right sid CVA, Residual Symptoms  negative psych ROS   GI/Hepatic negative GI ROS, Neg liver ROS,,,  Endo/Other  negative endocrine ROS    Renal/GU negative Renal ROS     Musculoskeletal  (+) Arthritis ,  Wheelchair bound   Abdominal   Peds  Hematology  (+) Blood dyscrasia (Eliquis)   Anesthesia Other Findings Atrial fibrillation, unspecified type   Reproductive/Obstetrics                             Anesthesia Physical Anesthesia Plan  ASA: 4  Anesthesia Plan: General   Post-op Pain Management:    Induction: Intravenous  PONV Risk Score and Plan: 2 and Ondansetron, Dexamethasone, Midazolam and Treatment may vary due to age or medical condition  Airway Management Planned: Oral ETT and Video Laryngoscope Planned  Additional Equipment: ClearSight and TEE  Intra-op Plan:   Post-operative Plan: Extubation in OR  Informed Consent: I have reviewed the patients History and Physical, chart, labs and discussed the procedure including the risks, benefits and alternatives for the proposed anesthesia with the patient or authorized representative who has indicated his/her understanding and acceptance.     Dental advisory given  Plan Discussed with: CRNA and Surgeon  Anesthesia Plan Comments: (TEE probe placement for the cardiologist. )       Anesthesia Quick Evaluation

## 2022-07-11 NOTE — H&P (Signed)
Electrophysiology Office Follow up Visit Note:     Date:  07/11/2022    ID:  Walter [name], DOB January 05, 1956, MRN [id number]   PCP:  Alroy Dust, Carlean Jews.Marlou Sa, MD       [facility] Electrophysiologist:  [name], MD      Interval History:     Walter [name] is a 67 y.o. male who presents for a follow up visit. They were last seen in clinic October 05, 2021 for his history of atrial fibrillation and intracranial hemorrhage.  His intracranial hemorrhage occurred in the absence of anticoagulation.  He has subsequently had a cardioembolic stroke.  I was originally referred the patient by Dr. Lovena Le who is his primary electrophysiologist for consideration of watchman given his history of spontaneous intracranial hemorrhage and need for long-term anticoagulation.   I originally saw the patient October 05, 2021 in consultation for possible watchman implant.  I thought he was a good candidate at that time.  He then saw Dr. Leonie Man from neurology.  The patient and his wife present to clinic today after their appointment with Dr. Leonie Man for continued discussions about left atrial appendage occlusion.    Presents for LAAO today. Procedure reviewed.       Objective      Past Medical History:  Diagnosis Date   Atrial fl[facility])      s/p ablation   DVT (deep venous thrombosis) (HCC)     Near syncope 09/25/2013   Paroxysmal atrial fibrillation (HCC)     Stroke [facility])      initially hemorragic (not on [place]) followed by subsequent embolic stroke           Past Surgical History:  Procedure Laterality Date   A-FLUTTER ABLATION N/A 03/09/2020    Procedure: A-FLUTTER ABLATION;  Surgeon: Thompson Grayer, MD;  Location: [place] CV LAB;  Service: Cardiovascular;  Laterality: N/A;   CARDIOVERSION N/A 09/27/2013    Procedure: CARDIOVERSION;  Surgeon: Lelon Perla, MD;  Location: [facility] ENDOSCOPY;  Service: Cardiovascular;  Laterality: N/A;   IR ANGIO EXTERNAL CAROTID SEL EXT CAROTID UNI R MOD SED    09/26/2020   IR ANGIO INTRA EXTRACRAN SEL COM CAROTID INNOMINATE BILAT MOD SED   09/26/2020   IR ANGIO VERTEBRAL SEL VERTEBRAL UNI R MOD SED   09/26/2020   IR IVC FILTER PLMT / S&I /IMG GUID/MOD SED   10/08/2020   IR IVC FILTER RETRIEVAL / S&I /IMG GUID/MOD SED   09/28/2021   IR IVUS EACH ADDITIONAL NON CORONARY VESSEL   11/02/2020   IR PTA VENOUS EXCEPT DIALYSIS CIRCUIT   11/02/2020   IR RADIOLOGIST EVAL & MGMT   01/17/2021   IR RADIOLOGIST EVAL & MGMT   09/12/2021   IR THROMBECT VENO MECH MOD SED   10/27/2020   IR THROMBECT VENO MECH MOD SED   11/02/2020   IR US GUIDE VASC ACCESS LEFT   10/27/2020   IR US GUIDE VASC ACCESS RIGHT   09/26/2020   IR US GUIDE VASC ACCESS RIGHT   11/02/2020   IR VENO/EXT/UNI RIGHT   11/02/2020   IR VENOCAVAGRAM IVC   10/27/2020   RETINAL DETACHMENT SURGERY       right knee arthroscopy       TEE WITHOUT CARDIOVERSION N/A 09/27/2013    Procedure: TRANSESOPHAGEAL ECHOCARDIOGRAM (TEE);  Surgeon: Lelon Perla, MD;  Location: [place];  Service: Cardiovascular;  Laterality: N/A;   WRIST SURGERY  Current Medications: Active Medications      Current Meds  Medication Sig   apixaban (ELIQUIS) 5 MG TABS tablet Take 1 tablet (5 mg total) by mouth 2 (two) times daily.   atorvastatin (LIPITOR) 20 MG tablet Take 1 tablet (20 mg total) by mouth at bedtime.   diltiazem (CARDIZEM CD) 120 MG 24 hr capsule Take 1 capsule (120 mg total) by mouth daily.   EPINEPHrine 0.3 mg/0.3 mL IJ SOAJ injection Inject 0.3 mg into the muscle as needed for anaphylaxis.   mirtazapine (REMERON) 7.5 MG tablet TAKE 2 TABLETS BY MOUTH AT BEDTIME   pregabalin (LYRICA) 50 MG capsule Take 1 capsule (50 mg total) by mouth 2 (two) times daily.   sertraline (ZOLOFT) 100 MG tablet Take 100 mg by mouth daily.        Allergies:   Keppra [levetiracetam] and Latex    Social History         Socioeconomic History   Marital status: Married      Spouse name: cathy   Number of children: Not on file    Years of education: Not on file   Highest education level: Not on file  Occupational History   Not on file  Tobacco Use   Smoking status: Never   Smokeless tobacco: Never  Vaping Use   Vaping Use: Never used  Substance and Sexual Activity   Alcohol use: Yes      Comment: occasionally   Drug use: No   Sexual activity: Not on file  Other Topics Concern   Not on file  Social History Narrative    Lives with wife at home    Right Handed    Drinks decaf    Social Determinants of Health    Financial Resource Strain: Not on file  Food Insecurity: Not on file  Transportation Needs: Not on file  Physical Activity: Not on file  Stress: Not on file  Social Connections: Not on file      Family History: The patient's family history includes Healthy in his brother and sister; Heart disease in his father and mother.   ROS:   Please see the history of present illness.    All other systems reviewed and are negative.   EKGs/Labs/Other Studies Reviewed:     The following studies were reviewed today:           Recent Labs: 10/29/2020: ALT 45 08/15/2021: Pro B Natriuretic peptide (BNP) 32.0; TSH 1.92 09/28/2021: BUN 10; Creatinine, Ser 0.91; Hemoglobin 15.3; Platelets 216; Potassium 4.1; Sodium 138  Recent Lipid Panel Labs (Brief)          Component Value Date/Time    CHOL 155 09/25/2020 1659    TRIG 117 09/25/2020 1659    HDL 44 09/25/2020 1659    CHOLHDL 3.5 09/25/2020 1659    VLDL 23 09/25/2020 1659    LDLCALC 88 09/25/2020 1659        Physical Exam:     VS:  BP 144/85   Pulse 68   Ht 6' (1.829 m)   Wt 231 lb (104.8 kg) Comment: per patient :wheelchair"  SpO2 94%   BMI 31.33 kg/m         Wt Readings from Last 3 Encounters:  10/19/21 231 lb (104.8 kg)  10/05/21 229 lb 9.6 oz (104.1 kg)  09/28/21 225 lb (102.1 kg)      GEN:  Well nourished, well developed in no acute distress HEENT: Normal NECK: No JVD; No carotid bruits  LYMPHATICS: No  lymphadenopathy CARDIAC: RRR, no murmurs, rubs, gallops RESPIRATORY:  Clear to auscultation without rales, wheezing or rhonchi  ABDOMEN: Soft, non-tender, non-distended MUSCULOSKELETAL:  No edema; No deformity  SKIN: Warm and dry NEUROLOGIC:  Alert and oriented x 3.  Stable neuro deficits from prior stroke. PSYCHIATRIC:  Normal affect            Assessment ASSESSMENT:     1. PAF (paroxysmal atrial fibrillation) (HCC)   2. Typical atrial flutter (HCC)   3. Primary hypertension   4. Intracranial hemorrhage (HCC)     PLAN:     In order of problems listed above:     #Paroxysmal atrial fibrillation #Spontaneous intracranial hemorrhage We again reviewed his history of atrial fibrillation and the associated risk of embolic stroke.  We discussed the risk of bleeding while on anticoagulation.  Given the patient's history of spontaneous intracranial hemorrhage, I do think he is at an elevated risk for significant bleeding while on anticoagulation.  I again discussed this with Dr. Pearlean Brownie on the telephone yesterday.  Dr. Pearlean Brownie is unclear as to why Mr. Holdren had his original intracranial hemorrhage.  Given the lack of clarity on the etiology of his original intracranial hemorrhage, I do think he is a very appropriate candidate for left atrial appendage occlusion as a stroke mitigation strategy.     I have asked the patient to think about the procedure at home with his family before making a final decision about how to proceed.  If they would like to proceed with watchman, they can reach out and we will get things scheduled for them.  In the interim, Dr. Pearlean Brownie has recommended he restart Eliquis.   ---------------   I have seen Walter [name] in the office today who is being considered for a Watchman left atrial appendage closure device. I believe they will benefit from this procedure given their history of atrial fibrillation, CHA2DS2-VASc score of 4 and unadjusted ischemic stroke rate of 4.8%  per year. Unfortunately, the patient is not felt to be a long term anticoagulation candidate secondary to history of hemorrhagic stroke. The patient's chart has been reviewed and I feel that they would be a candidate for short term oral anticoagulation after Watchman implant.    It is my belief that after undergoing a LAA closure procedure, Walter [name] will not need long term anticoagulation which eliminates anticoagulation side effects and major bleeding risk.    Procedural risks for the Watchman implant have been reviewed with the patient including a 0.5% risk of stroke, <1% risk of perforation and <1% risk of device embolization.      The published clinical data on the safety and effectiveness of WATCHMAN include but are not limited to the following: - Holmes DR, Everlene Farrier, Sick P et al. for the PROTECT AF Investigators. Percutaneous closure of the left atrial appendage versus warfarin therapy for prevention of stroke in patients with atrial fibrillation: a randomised non-inferiority trial. Lancet 2009; 374: 534-42. Everlene Farrier, Doshi SK, Isa Rankin D et al. on behalf of the PROTECT AF Investigators. Percutaneous Left Atrial Appendage Closure for Stroke Prophylaxis in Patients With Atrial Fibrillation 2.3-Year Follow-up of the PROTECT AF (Watchman Left Atrial Appendage System for Embolic Protection in Patients With Atrial Fibrillation) Trial. Circulation 2013; 127:720-729. - Alli O, Doshi S,  Kar S, Reddy VY, Sievert H et al. Quality of Life Assessment in the Randomized PROTECT AF (Percutaneous Closure of the Left Atrial Appendage Versus Warfarin  Therapy for Prevention of Stroke in Patients With Atrial Fibrillation) Trial of Patients at Risk for Stroke With Nonvalvular Atrial Fibrillation. J Am Coll Cardiol 2013; N8865744. Vertell Limber DR, Tarri Abernethy, Price M, [place], Sievert H, Doshi S, Huber K, Reddy V. Prospective randomized evaluation of the Watchman left atrial appendage Device in patients  with atrial fibrillation versus long-term warfarin therapy; the PREVAIL trial. Journal of the [employer] of Cardiology, Vol. 4, No. 1, 2014, 1-11. - Kar S, Doshi SK, Sadhu A, Horton R, Osorio J et al. Primary outcome evaluation of a next-generation left atrial appendage closure device: results from the PINNACLE FLX trial. Circulation 2021;143(18)1754-1762.      After today's visit with the patient which was dedicated solely for shared decision making visit regarding LAA closure device, the patient decided to proceed with the LAA appendage closure procedure scheduled to be done in the near future at [facility]. Prior to the procedure, I would like to obtain a gated CT scan of the chest with contrast timed for PV/LA visualization.      HAS-BLED score 4 Hypertension Yes  Abnormal renal and liver function (Dialysis, transplant, Cr >2.26 mg/dL /Cirrhosis or Bilirubin >2x Normal or AST/ALT/AP >3x Normal) No  Stroke Yes  Bleeding Yes  Labile INR (Unstable/high INR) No  Elderly (>65) Yes  Drugs or alcohol (? 8 drinks/week, anti-plt or NSAID) No    CHA2DS2-VASc Score = 4  The patient's score is based upon: CHF History: 0 HTN History: 1 Diabetes History: 0 Stroke History: 2 Vascular Disease History: 0 Age Score: 1 Gender Score: 0   Presents for LAAO. First attempt was aborted due to LAA thrombus. Will plan for pre op TEE to assess for LAA thrombus and proceed with LAAO if clear. Procedure reviewed. Patient understands risks including risks of trouble swallowing/problems with speech/problems with eating after TEE. He would like to proceed.     Signed, Lars Mage, MD, [facility], [facility] 07/11/2022 Electrophysiology [facility]

## 2022-07-11 NOTE — Discharge Summary (Signed)
HEART AND VASCULAR CENTER   MULTIDISCIPLINARY HEART TEAM   DISCHARGE SUMMARY   Patient ID: Walter [name],  MRN: [id number], DOB/AGE: 1955-09-12 67 y.o.  Admit date: 07/11/2022 Discharge date: 07/11/2022  Primary Care Physician: Alroy Dust, Carlean Jews.Marlou Sa, MD  Primary Cardiologist: None  Electrophysiologist: [name], MD  Primary Discharge Diagnosis:  Paroxysmal Atrial Fibrillation Poor candidacy for long term anticoagulation due to h/o intracranial hemorrhage  Secondary Discharge Diagnosis:  HTN DVT/PE Cardioembolic CVA    Procedures This Admission:  Transeptal Puncture Intra-procedural TEE which showed no LAA thrombus Left atrial appendage occlusive device placement on 07/11/22 by Dr. Quentin Ore.  This study demonstrated: IMPRESSIONS  1. Interventional TEE for LAA-O placement. Prior to procedure, patent left atrial appendage. Maximal dimension 2.5 cm suitable for a 31 mm FLX  device. Prior to transeptal puncture, the filamentous lesions seen in  prior TEE was again visualized. Contrast   imaging was performed, showing no evidence of thrombus. Succesful  transeptal puncture. Placement of a 31 mm with small mitral shoulder small  anterior gutter but with no peri-device leak and compression of ~ 26%.  Post procedure there was a left to right  interatrial shunt and a trivial pericardial effusion, unchanged from  prior.. No left atrial/left atrial appendage thrombus was detected.   2. Left ventricular ejection fraction, by estimation, is 60 to 65%. The  left ventricle has normal function.   3. Right ventricular systolic function is normal. The right ventricular  size is normal.   4. The mitral valve is abnormal. Trivial mitral valve regurgitation. No  evidence of mitral stenosis.   5. The aortic valve is tricuspid. Aortic valve regurgitation is not  visualized.   6. Evidence of atrial level shunting detected by color flow Doppler.   Comparison(s): No change from prior.        Brief HPI: Walter [name] is a 67 y.o. male with a history of HTN, paroxysmal atrial fib/flutter s/p ablation, DVT/PE, spontaneous intracranial hemorrhage 08/2020, and cardio embolic CVA who presented to [facility] on 07/11/22 for planned LAAO with Watchman.   Walter [name] was diagnosed with AF 08/2020. He presented to [facility] 09/25/20 after awaking from sleep with slurred speech and HA. On ED arrival he was intubated due to lethargy. Emergent head CT was performed which showed a left midbrain hemorrhage involving the dorsal part of the pons as well with extension into the cerebral aqueduct and fourth ventricle with local mass-effect on the brainstem but no hydrocephalus. CT angiogram of the head and neck showed no large vessel occlusion or AV malformation. He was started on hypertonic saline and had repeat CT scan which showed worsening hydrocephalus and neurosurgery was consulted who placed a right frontal ventriculostomy catheter. MRI scan of the brain was obtained which confirmed acute hemorrhage within the left dorsal midbrain and pons extending to inferior medial thalamus with intraventricular extension. After EVD placement, he showed neurological improvement. Hospital course was c/b a DVT and PE requiring anticoagulation. He also required a tracheostomy and IVC filter which has since been removed.    He was transferred to to inpatient rehab on 10/26/21 where he stayed 4 weeks and went home 11/23/20. In last follow up with Dr. Leonie Man 09/2021, he was felt to be a good candidate for Watchman implant. He was referred for LAAO by Dr. Lovena Le and seen by Dr. Quentin Ore. He was scheduled for LAAO closure 02/28/22. Unfortunately, procedure attempted however case was aborted after echodensity seen on TEE with concern for possible LAAO thrombus.  He was restarted on Eliquis with initial plan to follow with a repeat TEE scheduled for 10/4. This was later cancelled due to patient concern of swallowing issues. CT not felt to give great  quality images based on the size of the echodensity. Plan changed to re attempt LAAO with a TEE on the table prior to the procedure. This was scheduled for 07/11/22.   The patient was seen as an outpatient and thought to be a poor candidate for continued anticoagulation due to h/o intracranial hemorrhage. Risks, benefits, and alternatives to left atrial appendage occlusive device placement device were reviewed with the patient who wished to proceed.  The patient underwent intra-procedural TEE as above.   Hospital Course:  The patient was admitted and underwent Left Atrial appendage occlusive device placement with details as outlined above.  They were monitored on telemetry which demonstrated sinus.  Groin was without complication.  The patient was examined and considered to be stable for discharge.  Wound care and restrictions were reviewed with the patient.    Post Implant Anticoagulation Strategy: Continue Eliquis 5mg  PO twice daily for 45 days, then transition to Plavix 75mg  PO daily to complete 6 months of post implant therapy. Plan for CT scan in 60 days to assess Watchman position.  _________________  HAS-BLED score 4 Hypertension Yes  Abnormal renal and liver function (Dialysis, transplant, Cr >2.26 mg/dL /Cirrhosis or Bilirubin >2x Normal or AST/ALT/AP >3x Normal) No  Stroke Yes  Bleeding Yes  Labile INR (Unstable/high INR) No  Elderly (>65) Yes  Drugs or alcohol (? 8 drinks/week, anti-plt or NSAID) No    CHA2DS2-VASc Score = 4   This indicates a 4.8% annual risk of stroke. The patient's score is based upon: CHF History: 0 HTN History: 1 Diabetes History: 0 Stroke History: 2 Vascular Disease History: 0 Age Score: 1 Gender Score: 0     Physical Exam: Vitals:   07/11/22 1415 07/11/22 1430 07/11/22 1445 07/11/22 1514  BP: 114/65 (!) 102/47 122/65 110/68  Pulse: 64 70 67 64  Resp: 11 19 17 12   Temp:    97.6 F (36.4 C)  TempSrc:    Oral  SpO2: 96% 94% 95% 93%  Weight:       Height:        GEN- The patient is well appearing, alert and oriented x 3 today.   HEENT: normocephalic, atraumatic; sclera clear, conjunctiva pink; hearing intact; oropharynx clear; neck supple  Lungs- Clear to ausculation bilaterally, normal work of breathing.  No wheezes, rales, rhonchi Heart- Regular rate and rhythm, no murmurs, rubs or gallops  GI- soft, non-tender, non-distended, bowel sounds present  Extremities- no clubbing, cyanosis, or edema; DP/PT/radial pulses 2+ bilaterally, groin without hematoma/bruit MS- no significant deformity or atrophy Skin- warm and dry, no rash or lesion Psych- euthymic mood, full affect Neuro- strength and sensation are intact   Labs:   Lab Results  Component Value Date   WBC 5.3 06/28/2022   HGB 15.7 06/28/2022   HCT 47.5 06/28/2022   MCV 95 06/28/2022   PLT 223 06/28/2022   No results for input(s): "NA", "K", "CL", "CO2", "BUN", "CREATININE", "CALCIUM", "PROT", "BILITOT", "ALKPHOS", "ALT", "AST", "GLUCOSE" in the last 168 hours.  Invalid input(s): "LABALBU"   Discharge Medications:  Allergies as of 07/11/2022       Reactions   Keppra [levetiracetam] Swelling   Patient experienced angioedema post inpatient keppra dose   Latex Itching   Skin turns real red  Medication List     TAKE these medications    atorvastatin 20 MG tablet Commonly known as: LIPITOR Take 1 tablet (20 mg total) by mouth at bedtime.   Dilt-XR 180 MG 24 hr capsule Generic drug: diltiazem Take 180 mg by mouth daily.   Eliquis 5 MG Tabs tablet Generic drug: apixaban TAKE 1 TABLET BY MOUTH TWICE A DAY   EPINEPHrine 0.3 mg/0.3 mL Soaj injection Commonly known as: EPI-PEN Inject 0.3 mg into the muscle as needed for anaphylaxis.   mirtazapine 7.5 MG tablet Commonly known as: REMERON TAKE 2 TABLETS BY MOUTH AT BEDTIME What changed: how much to take   REFRESH DRY EYE THERAPY OP Place 1 drop into both eyes daily as needed (Dry eyes).    sertraline 100 MG tablet Commonly known as: ZOLOFT Take 100 mg by mouth at bedtime.   Ventolin HFA 108 (90 Base) MCG/ACT inhaler Generic drug: albuterol Inhale 1-2 puffs into the lungs every 6 (six) hours as needed for wheezing or shortness of breath.        Disposition:  home same day   Follow-up Information     Tommie Raymond, NP. Go on 08/19/2022.   Specialty: Cardiology Why: @ 9:45 am. Please arrive at least 10 minutes early Contact information: [place] [address] [phone]                 Duration of Discharge Encounter: Greater than 30 minutes including physician time.  Signed, Angelena Form, PA-C  07/11/2022 3:52 PM

## 2022-07-11 NOTE — Transfer of Care (Signed)
Immediate Anesthesia Transfer of Care Note  Patient: Walter [name]  Procedure(s) Performed: LEFT ATRIAL APPENDAGE OCCLUSION TRANSESOPHAGEAL ECHOCARDIOGRAM (TEE)  Patient Location: PACU and Cath Lab  Anesthesia Type:General  Level of Consciousness: awake and alert   Airway & Oxygen Therapy: Patient Spontanous Breathing and Patient connected to nasal cannula oxygen  Post-op Assessment: Report given to RN and Post -op Vital signs reviewed and stable  Post vital signs: Reviewed and stable  Last Vitals:  Vitals Value Taken Time  BP 113/56 07/11/22 1132  Temp 36.8 C 07/11/22 1132  Pulse 66 07/11/22 1134  Resp 11 07/11/22 1134  SpO2 97 % 07/11/22 1134  Vitals shown include unvalidated device data.  Last Pain:  Vitals:   07/11/22 1132  TempSrc: Temporal  PainSc: 0-No pain         Complications: No notable events documented.

## 2022-07-11 NOTE — Discharge Instructions (Addendum)
WATCHMANT Procedure, Care After  Procedure MD: Dr. Lambert Watchman Clinical Coordinator: Katy Kemp, RN  This sheet gives you information about how to care for yourself after your procedure. Your health care provider may also give you more specific instructions. If you have problems or questions, contact your health care provider.  What can I expect after the procedure? After the procedure, it is common to have: Bruising around your puncture site. Tenderness around your puncture site. Tiredness (fatigue).  Medication instructions It is very important to continue to take your blood thinner as directed by your doctor after the Watchman procedure. Call your procedure doctor's office with question or concerns. If you are on Coumadin (warfarin), you will have your INR checked the week after your procedure, with a goal INR of 2.0 - 3.0. Please follow your medication instructions on your discharge summary. Only take the medications listed on your discharge paperwork.  Follow up You will be seen in 1 month after your procedure  You will have a repeat CT scan approximately 8 weeks after your procedure mark to check your device You will follow up the MD/APP who performed your procedure 6 months after your procedure The Watchman Clinical Coordinator will check in with you from time to time, including 1 and 2 years after your procedure.    Follow these instructions at home: Puncture site care  Follow instructions from your health care provider about how to take care of your puncture site. Make sure you: If present, leave stitches (sutures), skin glue, or adhesive strips in place.  If a large square bandage is present, this may be removed 24 hours after surgery.  Check your puncture site every day for signs of infection. Check for: Redness, swelling, or pain. Fluid or blood. If your puncture site starts to bleed, lie down on your back, apply firm pressure to the area, and contact your health  care provider. Warmth. Pus or a bad smell. Driving Do not drive yourself home if you received sedation Do not drive for at least 4 days after your procedure or however long your health care provider recommends. (Do not resume driving if you have previously been instructed not to drive for other health reasons.) Do not spend greater than 1 hour at a time in a car for the first 3 days. Stop and take a break with a 5 minute walk at least every hour.  Do not drive or use heavy machinery while taking prescription pain medicine.  Activity Avoid activities that take a lot of effort, including exercise, for at least 7 days after your procedure. For the first 3 days, avoid sitting for longer than one hour at a time.  Avoid alcoholic beverages, signing paperwork, or participating in legal proceedings for 24 hours after receiving sedation Do not lift anything that is heavier than 10 lb (4.5 kg) for one week.  No sexual activity for 1 week.  Return to your normal activities as told by your health care provider. Ask your health care provider what activities are safe for you. General instructions Take over-the-counter and prescription medicines only as told by your health care provider. Do not use any products that contain nicotine or tobacco, such as cigarettes and e-cigarettes. If you need help quitting, ask your health care provider. You may shower after 24 hours, but Do not take baths, swim, or use a hot tub for 1 week.  Do not drink alcohol for 24 hours after your procedure. Keep all follow-up visits as told by   your health care provider. This is important. Dental Work: You will require antibiotics prior to any dental work, including cleanings, for 6 months after your Watchman implantation to help protect you from infection. After 6 months, antibiotics are no longer required. Contact a health care provider if: You have redness, mild swelling, or pain around your puncture site. You have soreness in  your throat or at your puncture site that does not improve after several days You have fluid or blood coming from your puncture site that stops after applying firm pressure to the area. Your puncture site feels warm to the touch. You have pus or a bad smell coming from your puncture site. You have a fever. You have chest pain or discomfort that spreads to your neck, jaw, or arm. You are sweating a lot. You feel nauseous. You have a fast or irregular heartbeat. You have shortness of breath. You are dizzy or light-headed and feel the need to lie down. You have pain or numbness in the arm or leg closest to your puncture site. Get help right away if: Your puncture site suddenly swells. Your puncture site is bleeding and the bleeding does not stop after applying firm pressure to the area. These symptoms may represent a serious problem that is an emergency. Do not wait to see if the symptoms will go away. Get medical help right away. Call your local emergency services (911 in the [place] Do not drive yourself to the hospital. Summary After the procedure, it is normal to have bruising and tenderness at the puncture site in your groin, neck, or forearm. Check your puncture site every day for signs of infection. Get help right away if your puncture site is bleeding and the bleeding does not stop after applying firm pressure to the area. This is a medical emergency.  This information is not intended to replace advice given to you by your health care provider. Make sure you discuss any questions you have with your health care provider.   

## 2022-07-11 NOTE — Progress Notes (Signed)
Pt received from cath lab with R groin soft and pulses palpable.  Pt alert and appropriate, VSS.  Plan of care reviewed with Pt and wife.  Both understand bedrest is over at 3:20pm.  GIven menu, urinal, and ice cream per request.  Will cont plan of care.

## 2022-07-11 NOTE — Anesthesia Postprocedure Evaluation (Signed)
Anesthesia Post Note  Patient: DASH CARDARELLI  Procedure(s) Performed: LEFT ATRIAL APPENDAGE OCCLUSION TRANSESOPHAGEAL ECHOCARDIOGRAM (TEE)     Patient location during evaluation: PACU Anesthesia Type: General Level of consciousness: awake Pain management: pain level controlled Vital Signs Assessment: post-procedure vital signs reviewed and stable Respiratory status: spontaneous breathing, nonlabored ventilation and respiratory function stable Cardiovascular status: blood pressure returned to baseline and stable Postop Assessment: no apparent nausea or vomiting Anesthetic complications: no   No notable events documented.  Last Vitals:  Vitals:   07/11/22 1445 07/11/22 1514  BP: 122/65 110/68  Pulse: 67 64  Resp: 17 12  Temp:  36.4 C  SpO2: 95% 93%    Last Pain:  Vitals:   07/11/22 1514  TempSrc: Oral  PainSc:                  Latarsha Zani P Lucelia Lacey

## 2022-07-11 NOTE — Anesthesia Procedure Notes (Signed)
Procedure Name: Intubation Date/Time: 07/11/2022 10:10 AM  Performed by: Inda Coke, CRNAPre-anesthesia Checklist: Patient identified, Emergency Drugs available, Suction available, Timeout performed and Patient being monitored Patient Re-evaluated:Patient Re-evaluated prior to induction Oxygen Delivery Method: Circle system utilized Preoxygenation: Pre-oxygenation with 100% oxygen Induction Type: IV induction Ventilation: Mask ventilation without difficulty Laryngoscope Size: Glidescope and 3 Grade View: Grade I Tube type: Oral Tube size: 7.5 mm Number of attempts: 1 Airway Equipment and Method: Stylet and Rigid stylet Placement Confirmation: ETT inserted through vocal cords under direct vision, positive ETCO2, CO2 detector and breath sounds checked- equal and bilateral Secured at: 23 cm Tube secured with: Tape Dental Injury: Teeth and Oropharynx as per pre-operative assessment

## 2022-07-12 ENCOUNTER — Telehealth: Payer: Self-pay | Admitting: Physician Assistant

## 2022-07-12 ENCOUNTER — Encounter (HOSPITAL_COMMUNITY): Payer: Self-pay | Admitting: Cardiology

## 2022-07-12 NOTE — Telephone Encounter (Signed)
  [facility] Team  Contacted the patient regarding discharge from [facility] on 07/11/22  The patient understands to follow up with [facility] on 2/19 in preparation for imaging in March.  The patient understands discharge instructions? Yes  The patient understands medications and regimen? Yes   The patient reports groin site looks good  The patient understands to call with any questions or concerns prior to scheduled visit.

## 2022-07-15 ENCOUNTER — Ambulatory Visit: Payer: Medicare Other | Admitting: Physical Therapy

## 2022-07-18 ENCOUNTER — Other Ambulatory Visit: Payer: Self-pay | Admitting: Neurology

## 2022-07-18 NOTE — Telephone Encounter (Signed)
Rx filled

## 2022-07-22 ENCOUNTER — Ambulatory Visit: Payer: Medicare Other | Admitting: Physical Therapy

## 2022-07-22 DIAGNOSIS — R2689 Other abnormalities of gait and mobility: Secondary | ICD-10-CM | POA: Diagnosis present

## 2022-07-22 DIAGNOSIS — R2681 Unsteadiness on feet: Secondary | ICD-10-CM | POA: Diagnosis present

## 2022-07-22 DIAGNOSIS — R278 Other lack of coordination: Secondary | ICD-10-CM

## 2022-07-23 ENCOUNTER — Encounter: Payer: Self-pay | Admitting: Physical Therapy

## 2022-07-23 NOTE — Therapy (Signed)
OUTPATIENT PHYSICAL THERAPY NEURO TREATMENT NOTE   Patient Name: Walter [name] MRN: [id number] DOB:07/13/1955, 67 y.o., male Today's Date: 07/23/2022   PCP: Walter [name], L. Walter Saucer, MD REFERRING PROVIDER: Micki Riley, MD    PT End of Session - 07/23/22 2017     Visit Number 6    Number of Visits 9    Date for PT Re-Evaluation 08/02/22   due to scheduling conflict 1 week and planned surgical procedure in Jan. 2024   Authorization Type Medicare    Authorization Time Period 05-20-22 - 08-18-21    Progress Note Due on Visit 10    PT Start Time 1450    PT Stop Time 1535    PT Time Calculation (min) 45 min    Equipment Utilized During Treatment Other (comment)   bar bell, aquatic step   Activity Tolerance Patient tolerated treatment well    Behavior During Therapy WFL for tasks assessed/performed              Past Medical History:  Diagnosis Date   Atrial flutter (HCC)    s/p ablation   DVT (deep venous thrombosis) (HCC)    Near syncope 09/25/2013   Paroxysmal atrial fibrillation (HCC)    Presence of Watchman left atrial appendage closure device 07/11/2022   s/p LAAO with Watchman with a 31 mm Watchman Flex device by Dr. Lalla [name].   Stroke [facility]    initially hemorragic (not on OAC) followed by subsequent embolic stroke   Past Surgical History:  Procedure Laterality Date   A-FLUTTER ABLATION N/A 03/09/2020   Procedure: A-FLUTTER ABLATION;  Surgeon: Hillis Range, MD;  Location: [facility] INVASIVE CV LAB;  Service: Cardiovascular;  Laterality: N/A;   CARDIOVERSION N/A 09/27/2013   Procedure: CARDIOVERSION;  Surgeon: Walter Bunting, MD;  Location: [facility] ENDOSCOPY;  Service: Cardiovascular;  Laterality: N/A;   IR ANGIO EXTERNAL CAROTID SEL EXT CAROTID UNI R MOD SED  09/26/2020   IR ANGIO INTRA EXTRACRAN SEL COM CAROTID INNOMINATE BILAT MOD SED  09/26/2020   IR ANGIO VERTEBRAL SEL VERTEBRAL UNI R MOD SED  09/26/2020   IR IVC FILTER PLMT / S&I /IMG GUID/MOD SED  10/08/2020   IR IVC FILTER  RETRIEVAL / S&I /IMG GUID/MOD SED  09/28/2021   IR IVUS EACH ADDITIONAL NON CORONARY VESSEL  11/02/2020   IR PTA VENOUS EXCEPT DIALYSIS CIRCUIT  11/02/2020   IR RADIOLOGIST EVAL & MGMT  01/17/2021   IR RADIOLOGIST EVAL & MGMT  09/12/2021   IR THROMBECT VENO MECH MOD SED  10/27/2020   IR THROMBECT VENO MECH MOD SED  11/02/2020   IR US GUIDE VASC ACCESS LEFT  10/27/2020   IR US GUIDE VASC ACCESS RIGHT  09/26/2020   IR US GUIDE VASC ACCESS RIGHT  11/02/2020   IR VENO/EXT/UNI RIGHT  11/02/2020   IR VENOCAVAGRAM IVC  10/27/2020   LEFT ATRIAL APPENDAGE OCCLUSION N/A 02/28/2022   Procedure: LEFT ATRIAL APPENDAGE OCCLUSION;  Surgeon: Walter Prude, MD;  Location: [facility] INVASIVE CV LAB;  Service: Cardiovascular;  Laterality: N/A;   LEFT ATRIAL APPENDAGE OCCLUSION N/A 07/11/2022   Procedure: LEFT ATRIAL APPENDAGE OCCLUSION;  Surgeon: Walter Prude, MD;  Location: [facility] INVASIVE CV LAB;  Service: Cardiovascular;  Laterality: N/A;   RETINAL DETACHMENT SURGERY     right knee arthroscopy     TEE WITHOUT CARDIOVERSION N/A 09/27/2013   Procedure: TRANSESOPHAGEAL ECHOCARDIOGRAM (TEE);  Surgeon: Walter Bunting, MD;  Location: [facility] ENDOSCOPY;  Service: Cardiovascular;  Laterality: N/A;  TEE WITHOUT CARDIOVERSION N/A 02/28/2022   Procedure: TRANSESOPHAGEAL ECHOCARDIOGRAM (TEE);  Surgeon: Walter Epley, MD;  Location: [place] CV LAB;  Service: Cardiovascular;  Laterality: N/A;   TEE WITHOUT CARDIOVERSION N/A 07/11/2022   Procedure: TRANSESOPHAGEAL ECHOCARDIOGRAM (TEE);  Surgeon: Walter Epley, MD;  Location: [place] CV LAB;  Service: Cardiovascular;  Laterality: N/A;   WRIST SURGERY     Patient Active Problem List   Diagnosis Date Noted   Presence of Watchman left atrial appendage closure device 07/11/2022   Hypertension 08/16/2021   Residual cognitive deficit as late effect of stroke 04/19/2021   Hemiparesis affecting right side as late effect of stroke ([place]) 04/19/2021   Left-sided nontraumatic  intracerebral hemorrhage of brainstem ([place]) 04/19/2021   Sleep disturbance    PAF (paroxysmal atrial fibrillation) (HCC)    Malnutrition of moderate degree 11/04/2020   Tracheostomy care ([place])    Pressure injury of skin 10/23/2020   Prediabetes    Leukocytosis    Hypernatremia    ICH (intracerebral hemorrhage) ([place]) 09/25/2020   Unilateral primary osteoarthritis, left knee 09/26/2016   Near syncope 09/25/2013    ONSET DATE: 09-25-20:  Referral date 04-16-22  REFERRING DIAG: I69.90 (ICD-10-CM) - Late, effect, cerebrovascular disease I83.393 (ICD-10-CM) - Ataxia due to old cerebrovascular accident (CVA)  THERAPY DIAG:  Other abnormalities of gait and mobility  Other lack of coordination  Unsteadiness on feet  Rationale for Evaluation and Treatment Rehabilitation  SUBJECTIVE:                                                                                                                                                                                              SUBJECTIVE STATEMENT: Pt reports he had Watchman implant procedure on 07-11-22 - states everything went well; no changes or problems reported   Pt accompanied by: wife, Walter [name]  PERTINENT HISTORY: Atrial flutter (HCC)s/p ablation DVT (deep venous thrombosis) (HCC) Near syncope03/28/2015 Paroxysmal atrial fibrillation (HCC) Stroke (HCC)initially hemorragic (not on [place]) followed by subsequent embolic stroke; has Lt hemiataxia and sensory deficits on Rt side of body; also 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 No  PRECAUTIONS: Fall (HIGH fall risk)  WEIGHT BEARING RESTRICTIONS No  FALLS: Has patient fallen in last 6 months? Yes. Number of falls approx. 14  falls   LIVING ENVIRONMENT: Lives with: lives with their spouse Lives in: apartment while house is being built - pt states house is supposed to be finished in Dec. '23 Stairs: No steps currently at apartment Has following equipment at home:  Gilford Rile - 2 wheeled, Electronics engineer,  and Grab bars,manual wheelchair; pt reports he is going to order a Zeen walker/wheelchair  Pt states they are building new house with 9" slope differential to enter home - is supposed to be ready in Dec.   PLOF: Independent prior to CVA in March 2022  PATIENT GOALS "Improve balance as much as possible"; be able to exercise safely without fall risk    OBJECTIVE:    Aquatic therapy at [place] 90 degrees  Patient seen for aquatic therapy today.  Treatment took place in water 3.6-4.8 feet deep depending upon activity.  Pt entered and exited the the pool via step negotiation with use of bilateral hand rails with CGA to SBA using step by step sequence.  Pt performed water walking for warm up - forwards, backwards and sideways 18' x 4 reps each direction across width of pool  Pt performed marching in place with cues to perform slowly and to tighten core for improved stabilization - 10 reps each leg - no UE support used  Pt performed hip kicks - unilateral LE's with CGA and with use of pool edge prn for assist with balance recovery - forward, back and side kicks 10 reps each direction   Performed cross country skiing 10 reps with CGA and then 1/2 jumping jacks 10 reps with CGA to min assist for balance recovery   Stepping strategy - forward, backward, and side stepping 10 reps each leg with min assist for balance recovery; mod assist for backward stepping   Pt performed static standing for core stabilization - moved bar bells in horizontal abduction/adduction and forward/back (protraction/retraction) 10 reps each direction with min assist intermittently for balance recovery  Tall kneeling on aquatic step on pool floor - pt performed mini squats 10 reps; hip extension and hip abduction each leg in tall kneeling 10 reps each with cues and CGA for upright posture and balance recovery  Pt requires buoyancy of water for support for reduced fall risk  with balance exercises in aquatic environment;  exercises able to be performed safely in water compared to that on land with higher fall risk;  current of water provides perturbations for challenges with static & dynamic standing balace.  Pt also requires viscosity of water for strengthening bil. lower extremities and trunk musculature for improved core stabilization.       GOALS: Goals reviewed with patient? Yes  SHORT TERM GOALS: Target date: 06-21-22  Initiate HEP for aquatic exercises.  Baseline: dependent Goal status:  Goal met  2.  Pt will amb. 10" in pool in 4' water depth without UE support on floatation device with SBA for improved endurance/activity tolerance.  Baseline: to be assessed Goal status:  Goal met  3. Pt will demonstrate improved balance by standing with feet </= 6" apart for 2" in 4' water depth without LOB.  Baseline:  feet 14" apart at eval on 05-20-22 with SBA - pt stood for 2"   Goal status:  Goal met  4.  Pt will negotiate steps in pool for entering and exiting with use of hand rails with min assist. Baseline: to be assessed Goal status: Goal met     LONG TERM GOALS:  Target date:  07-26-22  Pt will perform updated HEP for aquatic exercises with assist from family/caregiver as needed for safety. Baseline:  Goal status: INITIAL  2.  Pt will amb. 15" nonstop in pool in 4' water depth with supervision without LOB for increased endurance/activity tolerance.  Baseline: to be  assessed Goal status: INITIAL  3.  Pt will negotiate steps in pool area with hand rails with CGA using step by step sequence.  Baseline:  to be assessed  Goal status:  INITIAL   ASSESSMENT:  CLINICAL IMPRESSION: PT session focused on gait and balance training in 4.5- 4.8' water depth.  Pt able to recover LOB with CGA to min assist.  Cont to need cues to decrease speed of movement for increased control.  Cont with POC.    OBJECTIVE IMPAIRMENTS Abnormal gait, decreased activity  tolerance, decreased balance, decreased coordination, decreased knowledge of use of DME, difficulty walking, decreased strength, impaired sensation, and impaired vision/preception.   ACTIVITY LIMITATIONS carrying, lifting, bending, standing, squatting, stairs, transfers, and dressing  PARTICIPATION LIMITATIONS: meal prep, cleaning, laundry, driving, shopping, and community activity  PERSONAL FACTORS Behavior pattern, Past/current experiences, and Time since onset of injury/illness/exacerbation are also affecting patient's functional outcome.   REHAB POTENTIAL: Fair due to chronicity of deficits & time since onset  CLINICAL DECISION MAKING: Evolving/moderate complexity  EVALUATION COMPLEXITY: Moderate  PLAN: PT FREQUENCY: 1x/week  PT DURATION: 8 weeks  PLANNED INTERVENTIONS: Patient/Family education, Self Care, and Aquatic Therapy  PLAN FOR NEXT SESSION: aquatic exercise only per pt's request   Kayden Hutmacher Suzanne, PT 07/23/2022, 8:20 PM

## 2022-07-29 ENCOUNTER — Ambulatory Visit: Payer: Medicare Other | Admitting: Physical Therapy

## 2022-07-29 DIAGNOSIS — R2689 Other abnormalities of gait and mobility: Secondary | ICD-10-CM

## 2022-07-29 DIAGNOSIS — R2681 Unsteadiness on feet: Secondary | ICD-10-CM

## 2022-07-29 DIAGNOSIS — R278 Other lack of coordination: Secondary | ICD-10-CM

## 2022-07-30 ENCOUNTER — Encounter: Payer: Self-pay | Admitting: Physical Therapy

## 2022-07-30 NOTE — Therapy (Signed)
OUTPATIENT PHYSICAL THERAPY NEURO TREATMENT NOTE   Patient Name: Walter [name] MRN: [id number] DOB:11-Sep-1955, 67 y.o., male Today's Date: 07/30/2022   PCP: Alroy Dust, L. Marlou Sa, MD REFERRING PROVIDER: Garvin Fila, MD    PT End of Session - 07/30/22 1605     Visit Number 7    Number of Visits 9    Date for PT Re-Evaluation 08/02/22   due to scheduling conflict 1 week and planned surgical procedure in Jan. 2024   Authorization Type Medicare    Authorization Time Period 05-20-22 - 08-18-21    Progress Note Due on Visit 10    PT Start Time 2706    PT Stop Time 1530    PT Time Calculation (min) 45 min    Equipment Utilized During Treatment Other (comment)   bar bell, aquatic step   Activity Tolerance Patient tolerated treatment well    Behavior During Therapy [facility] for tasks assessed/performed              Past Medical History:  Diagnosis Date   Atrial flutter ([place])    s/p ablation   DVT (deep venous thrombosis) (HCC)    Near syncope 09/25/2013   Paroxysmal atrial fibrillation (HCC)    Presence of Watchman left atrial appendage closure device 07/11/2022   s/p LAAO with Watchman with a 31 mm Watchman Flex device by Dr. Quentin Ore.   Stroke [facility])    initially hemorragic (not on [place]) followed by subsequent embolic stroke   Past Surgical History:  Procedure Laterality Date   A-FLUTTER ABLATION N/A 03/09/2020   Procedure: A-FLUTTER ABLATION;  Surgeon: Thompson Grayer, MD;  Location: [place] CV LAB;  Service: Cardiovascular;  Laterality: N/A;   CARDIOVERSION N/A 09/27/2013   Procedure: CARDIOVERSION;  Surgeon: Lelon Perla, MD;  Location: [facility] ENDOSCOPY;  Service: Cardiovascular;  Laterality: N/A;   IR ANGIO EXTERNAL CAROTID SEL EXT CAROTID UNI R MOD SED  09/26/2020   IR ANGIO INTRA EXTRACRAN SEL COM CAROTID INNOMINATE BILAT MOD SED  09/26/2020   IR ANGIO VERTEBRAL SEL VERTEBRAL UNI R MOD SED  09/26/2020   IR IVC FILTER PLMT / S&I /IMG GUID/MOD SED  10/08/2020   IR IVC FILTER  RETRIEVAL / S&I /IMG GUID/MOD SED  09/28/2021   IR IVUS EACH ADDITIONAL NON CORONARY VESSEL  11/02/2020   IR PTA VENOUS EXCEPT DIALYSIS CIRCUIT  11/02/2020   IR RADIOLOGIST EVAL & MGMT  01/17/2021   IR RADIOLOGIST EVAL & MGMT  09/12/2021   IR THROMBECT VENO MECH MOD SED  10/27/2020   IR THROMBECT VENO MECH MOD SED  11/02/2020   IR US GUIDE VASC ACCESS LEFT  10/27/2020   IR US GUIDE VASC ACCESS RIGHT  09/26/2020   IR US GUIDE VASC ACCESS RIGHT  11/02/2020   IR VENO/EXT/UNI RIGHT  11/02/2020   IR VENOCAVAGRAM IVC  10/27/2020   LEFT ATRIAL APPENDAGE OCCLUSION N/A 02/28/2022   Procedure: LEFT ATRIAL APPENDAGE OCCLUSION;  Surgeon: [name], MD;  Location: [place] [facility] LAB;  Service: Cardiovascular;  Laterality: N/A;   LEFT ATRIAL APPENDAGE OCCLUSION N/A 07/11/2022   Procedure: LEFT ATRIAL APPENDAGE OCCLUSION;  Surgeon: [name], MD;  Location: [place] [facility];  Service: Cardiovascular;  Laterality: N/A;   RETINAL DETACHMENT SURGERY     right knee arthroscopy     TEE WITHOUT CARDIOVERSION N/A 09/27/2013   Procedure: TRANSESOPHAGEAL ECHOCARDIOGRAM (TEE);  Surgeon: Lelon Perla, MD;  Location: [place];  Service: Cardiovascular;  Laterality: N/A;  TEE WITHOUT CARDIOVERSION N/A 02/28/2022   Procedure: TRANSESOPHAGEAL ECHOCARDIOGRAM (TEE);  Surgeon: Lanier Prude, MD;  Location: [facility] INVASIVE CV LAB;  Service: Cardiovascular;  Laterality: N/A;   TEE WITHOUT CARDIOVERSION N/A 07/11/2022   Procedure: TRANSESOPHAGEAL ECHOCARDIOGRAM (TEE);  Surgeon: Lanier Prude, MD;  Location: [facility] INVASIVE CV LAB;  Service: Cardiovascular;  Laterality: N/A;   WRIST SURGERY     Patient Active Problem List   Diagnosis Date Noted   Presence of Watchman left atrial appendage closure device 07/11/2022   Hypertension 08/16/2021   Residual cognitive deficit as late effect of stroke 04/19/2021   Hemiparesis affecting right side as late effect of stroke (HCC) 04/19/2021   Left-sided nontraumatic  intracerebral hemorrhage of brainstem (HCC) 04/19/2021   Sleep disturbance    PAF (paroxysmal atrial fibrillation) (HCC)    Malnutrition of moderate degree 11/04/2020   Tracheostomy care (HCC)    Pressure injury of skin 10/23/2020   Prediabetes    Leukocytosis    Hypernatremia    ICH (intracerebral hemorrhage) (HCC) 09/25/2020   Unilateral primary osteoarthritis, left knee 09/26/2016   Near syncope 09/25/2013    ONSET DATE: 09-25-20:  Referral date 04-16-22  REFERRING DIAG: I69.90 (ICD-10-CM) - Late, effect, cerebrovascular disease I60.393 (ICD-10-CM) - Ataxia due to old cerebrovascular accident (CVA)  THERAPY DIAG:  Other abnormalities of gait and mobility  Unsteadiness on feet  Other lack of coordination  Rationale for Evaluation and Treatment Rehabilitation  SUBJECTIVE:                                                                                                                                                                                              SUBJECTIVE STATEMENT: Pt reports he had Watchman implant procedure on 07-11-22 - states everything went well; no changes or problems reported   Pt accompanied by: wife, [name]  PERTINENT HISTORY: Atrial flutter (HCC)s/p ablation DVT (deep venous thrombosis) (HCC) Near syncope03/28/2015 Paroxysmal atrial fibrillation (HCC) Stroke (HCC)initially hemorragic (not on [place]) followed by subsequent embolic stroke; has Lt hemiataxia and sensory deficits on Rt side of body; also 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 No  PRECAUTIONS: Fall (HIGH fall risk)  WEIGHT BEARING RESTRICTIONS No  FALLS: Has patient fallen in last 6 months? Yes. Number of falls approx. 14  falls   LIVING ENVIRONMENT: Lives with: lives with their spouse Lives in: apartment while house is being built - pt states house is supposed to be finished in Dec. '23 Stairs: No steps currently at apartment Has following equipment at home:  Dan Humphreys - 2 wheeled, Tour manager,  and Grab bars,manual wheelchair; pt reports he is going to order a Zeen walker/wheelchair  Pt states they are building new house with 9" slope differential to enter home - is supposed to be ready in Dec.   PLOF: Independent prior to CVA in March 2022  PATIENT GOALS "Improve balance as much as possible"; be able to exercise safely without fall risk    OBJECTIVE:    Aquatic therapy at [place] 90 degrees  Patient seen for aquatic therapy today.  Treatment took place in water 3.6-4.8 feet deep depending upon activity.  Pt entered and exited the the pool via step negotiation with use of bilateral hand rails with CGA to SBA using step by step sequence.  Pt performed water walking for warm up - forwards, backwards and sideways 18' x 4 reps each direction across width of pool  Pt performed marching in place with cues to perform slowly and to tighten core for improved stabilization - 10 reps each leg - no UE support used  Pt performed hip kicks - unilateral LE's with CGA and with use of pool edge prn for assist with balance recovery - forward, back and side kicks 10 reps each direction   Performed cross country skiing 10 reps with CGA and then 1/2 jumping jacks 10 reps with CGA to min assist for balance recovery   Stepping strategy - forward, backward, and side stepping 10 reps each leg with min assist for balance recovery; mod assist for backward stepping   Pt performed static standing for core stabilization - moved bar bells in horizontal abduction/adduction and forward/back (protraction/retraction) 10 reps each direction with min assist intermittently for balance recovery  Tall kneeling on aquatic step on pool floor - pt performed mini squats 10 reps; hip extension and hip abduction each leg in tall kneeling 10 reps each with cues and CGA for upright posture and balance recovery  Pt requires buoyancy of water for support for reduced fall risk  with balance exercises in aquatic environment;  exercises able to be performed safely in water compared to that on land with higher fall risk;  current of water provides perturbations for challenges with static & dynamic standing balace.  Pt also requires viscosity of water for strengthening bil. lower extremities and trunk musculature for improved core stabilization.       GOALS: Goals reviewed with patient? Yes  SHORT TERM GOALS: Target date: 06-21-22  Initiate HEP for aquatic exercises.  Baseline: dependent Goal status:  Goal met  2.  Pt will amb. 10" in pool in 4' water depth without UE support on floatation device with SBA for improved endurance/activity tolerance.  Baseline: to be assessed Goal status:  Goal met  3. Pt will demonstrate improved balance by standing with feet </= 6" apart for 2" in 4' water depth without LOB.  Baseline:  feet 14" apart at eval on 05-20-22 with SBA - pt stood for 2"   Goal status:  Goal met  4.  Pt will negotiate steps in pool for entering and exiting with use of hand rails with min assist. Baseline: to be assessed Goal status: Goal met     LONG TERM GOALS:  Target date:  07-26-22;  EXTENDED DATE:  08-12-22  Pt will perform updated HEP for aquatic exercises with assist from family/caregiver as needed for safety. Baseline:  Goal status: Ongoing  2.  Pt will amb. 15" nonstop in pool in 4' water depth with supervision without LOB for increased endurance/activity  tolerance.  Baseline: to be assessed Goal status: Ongoing  3.  Pt will negotiate steps in pool area with hand rails with CGA using step by step sequence.  Baseline:  to be assessed  Goal status:  Goal met 07-29-22   ASSESSMENT:  CLINICAL IMPRESSION: Pt has met LTG #3 as he is able to negotiate steps in pool area with hand rails with CGA using step by step sequence.  Goal date extended as pt has not yet received 8 aquatic PT sessions due to cardiac procedure on 07-11-22 and also due  to pool being closed on 07-15-22.  Today's PT session focused on gait and balance training in 4.5- 4.8' water depth; pt able to recover LOB with CGA to min assist.  Pt continues to need cues to decrease speed with movements.  Cont with POC.    OBJECTIVE IMPAIRMENTS Abnormal gait, decreased activity tolerance, decreased balance, decreased coordination, decreased knowledge of use of DME, difficulty walking, decreased strength, impaired sensation, and impaired vision/preception.   ACTIVITY LIMITATIONS carrying, lifting, bending, standing, squatting, stairs, transfers, and dressing  PARTICIPATION LIMITATIONS: meal prep, cleaning, laundry, driving, shopping, and community activity  PERSONAL FACTORS Behavior pattern, Past/current experiences, and Time since onset of injury/illness/exacerbation are also affecting patient's functional outcome.   REHAB POTENTIAL: Fair due to chronicity of deficits & time since onset  CLINICAL DECISION MAKING: Evolving/moderate complexity  EVALUATION COMPLEXITY: Moderate  PLAN: PT FREQUENCY: 1x/week  PT DURATION: 8 weeks  PLANNED INTERVENTIONS: Patient/Family education, Self Care, and Aquatic Therapy  PLAN FOR NEXT SESSION: aquatic exercise only per pt's request   Alda Lea, PT 07/30/2022, 4:08 PM

## 2022-08-05 ENCOUNTER — Ambulatory Visit: Payer: Medicare Other | Attending: Family Medicine | Admitting: Physical Therapy

## 2022-08-05 DIAGNOSIS — R278 Other lack of coordination: Secondary | ICD-10-CM | POA: Diagnosis present

## 2022-08-05 DIAGNOSIS — R2681 Unsteadiness on feet: Secondary | ICD-10-CM | POA: Diagnosis present

## 2022-08-05 DIAGNOSIS — R2689 Other abnormalities of gait and mobility: Secondary | ICD-10-CM | POA: Diagnosis present

## 2022-08-06 ENCOUNTER — Ambulatory Visit: Payer: Medicare Other | Attending: Internal Medicine | Admitting: Internal Medicine

## 2022-08-06 ENCOUNTER — Encounter: Payer: Self-pay | Admitting: Internal Medicine

## 2022-08-06 ENCOUNTER — Encounter: Payer: Self-pay | Admitting: Physical Therapy

## 2022-08-06 VITALS — BP 142/82 | HR 69 | Ht 72.0 in | Wt 225.0 lb

## 2022-08-06 DIAGNOSIS — I4892 Unspecified atrial flutter: Secondary | ICD-10-CM | POA: Diagnosis present

## 2022-08-06 DIAGNOSIS — I480 Paroxysmal atrial fibrillation: Secondary | ICD-10-CM | POA: Insufficient documentation

## 2022-08-06 NOTE — Progress Notes (Signed)
HPI Mr. Beckstrand returns today for followup. He is a pleasant 67 yo man with an extensive neurological history. He had atrial flutter and underwent catheter ablation and about a year later had a spontaneous bleed while working out.The patient had a long post bleed course and extensive rehab. He has had atrial fib, but minimally. The patient is thought to be a poor long term candidate for systemic anti-coagulation and underwent Watchman implant several weeks ago. He has done well in the interim. He has built a new house and is building a heated pool to help him rehab in. No chest pain or sob. No edema.  Allergies  Allergen Reactions   Keppra [Levetiracetam] Swelling    Patient experienced angioedema post inpatient keppra dose   Latex Itching    Skin turns real red     Current Outpatient Medications  Medication Sig Dispense Refill   atorvastatin (LIPITOR) 20 MG tablet Take 1 tablet (20 mg total) by mouth at bedtime. 90 tablet 3   DILT-XR 180 MG 24 hr capsule Take 180 mg by mouth daily.     ELIQUIS 5 MG TABS tablet TAKE 1 TABLET BY MOUTH TWICE A DAY 60 tablet 1   EPINEPHrine 0.3 mg/0.3 mL IJ SOAJ injection Inject 0.3 mg into the muscle as needed for anaphylaxis.     Glycerin-Polysorbate 80 (REFRESH DRY EYE THERAPY OP) Place 1 drop into both eyes daily as needed (Dry eyes).     mirtazapine (REMERON) 7.5 MG tablet TAKE 2 TABLETS BY MOUTH AT BEDTIME (Patient taking differently: Take 7.5 mg by mouth at bedtime.) 180 tablet 1   sertraline (ZOLOFT) 100 MG tablet Take 100 mg by mouth at bedtime.     VENTOLIN HFA 108 (90 Base) MCG/ACT inhaler Inhale 1-2 puffs into the lungs every 6 (six) hours as needed for wheezing or shortness of breath.     No current facility-administered medications for this visit.     Past Medical History:  Diagnosis Date   Atrial flutter [facility])    s/p ablation   DVT (deep venous thrombosis) ([place])    Near syncope 09/25/2013   Paroxysmal atrial fibrillation (HCC)     Presence of Watchman left atrial appendage closure device 07/11/2022   s/p LAAO with Watchman with a 31 mm Watchman Flex device by Dr. Quentin Ore.   Stroke [facility])    initially hemorragic (not on [place]) followed by subsequent embolic stroke    ROS:   All systems reviewed and negative except as noted in the HPI.   Past Surgical History:  Procedure Laterality Date   A-FLUTTER ABLATION N/A 03/09/2020   Procedure: A-FLUTTER ABLATION;  Surgeon: Thompson Grayer, MD;  Location: [place] [facility] LAB;  Service: Cardiovascular;  Laterality: N/A;   CARDIOVERSION N/A 09/27/2013   Procedure: CARDIOVERSION;  Surgeon: Lelon Perla, MD;  Location: [facility] ENDOSCOPY;  Service: Cardiovascular;  Laterality: N/A;   IR ANGIO EXTERNAL CAROTID SEL EXT CAROTID UNI R MOD SED  09/26/2020   IR ANGIO INTRA EXTRACRAN SEL COM CAROTID INNOMINATE BILAT MOD SED  09/26/2020   IR ANGIO VERTEBRAL SEL VERTEBRAL UNI R MOD SED  09/26/2020   IR IVC FILTER PLMT / S&I /IMG GUID/MOD SED  10/08/2020   IR IVC FILTER RETRIEVAL / S&I /IMG GUID/MOD SED  09/28/2021   IR IVUS EACH ADDITIONAL NON CORONARY VESSEL  11/02/2020   IR PTA VENOUS EXCEPT DIALYSIS CIRCUIT  11/02/2020   IR RADIOLOGIST EVAL & MGMT  01/17/2021   IR RADIOLOGIST  EVAL & MGMT  09/12/2021   IR THROMBECT VENO MECH MOD SED  10/27/2020   IR THROMBECT VENO MECH MOD SED  11/02/2020   IR US GUIDE VASC ACCESS LEFT  10/27/2020   IR US GUIDE VASC ACCESS RIGHT  09/26/2020   IR US GUIDE VASC ACCESS RIGHT  11/02/2020   IR VENO/EXT/UNI RIGHT  11/02/2020   IR VENOCAVAGRAM IVC  10/27/2020   LEFT ATRIAL APPENDAGE OCCLUSION N/A 02/28/2022   Procedure: LEFT ATRIAL APPENDAGE OCCLUSION;  Surgeon: [name], MD;  Location: [place] [facility];  Service: Cardiovascular;  Laterality: N/A;   LEFT ATRIAL APPENDAGE OCCLUSION N/A 07/11/2022   Procedure: LEFT ATRIAL APPENDAGE OCCLUSION;  Surgeon: [name], MD;  Location: [place] [facility];  Service: Cardiovascular;  Laterality: N/A;   RETINAL DETACHMENT  SURGERY     right knee arthroscopy     TEE WITHOUT CARDIOVERSION N/A 09/27/2013   Procedure: TRANSESOPHAGEAL ECHOCARDIOGRAM (TEE);  Surgeon: Lelon Perla, MD;  Location: [facility] ENDOSCOPY;  Service: Cardiovascular;  Laterality: N/A;   TEE WITHOUT CARDIOVERSION N/A 02/28/2022   Procedure: TRANSESOPHAGEAL ECHOCARDIOGRAM (TEE);  Surgeon: [name], MD;  Location: [place] [facility];  Service: Cardiovascular;  Laterality: N/A;   TEE WITHOUT CARDIOVERSION N/A 07/11/2022   Procedure: TRANSESOPHAGEAL ECHOCARDIOGRAM (TEE);  Surgeon: [name], MD;  Location: [place] [facility];  Service: Cardiovascular;  Laterality: N/A;   WRIST SURGERY       Family History  Problem Relation Age of Onset   Heart disease Mother    Heart disease Father    Healthy Sister    Healthy Brother      Social History   Socioeconomic History   Marital status: Married    Spouse name: cathy   Number of children: Not on file   Years of education: Not on file   Highest education level: Not on file  Occupational History   Not on file  Tobacco Use   Smoking status: Never   Smokeless tobacco: Never  Vaping Use   Vaping Use: Never used  Substance and Sexual Activity   Alcohol use: Yes    Comment: occasionally   Drug use: No   Sexual activity: Not on file  Other Topics Concern   Not on file  Social History Narrative   Lives with wife at home   Right Handed   Drinks decaf   Social Determinants of Health   Financial Resource Strain: Not on file  Food Insecurity: Not on file  Transportation Needs: Not on file  Physical Activity: Not on file  Stress: Not on file  Social Connections: Not on file  Intimate Partner Violence: Not on file     BP (!) 142/82   Pulse 69   Ht 6' (1.829 m)   Wt 225 lb (102.1 kg)   SpO2 96%   BMI 30.52 kg/m   Physical Exam:  stable appearing 67 yo man, with a mild tremor, NAD HEENT: Unremarkable Neck:  No JVD, no thyromegally Lymphatics:  No  adenopathy Back:  No CVA tenderness Lungs:  Clear with no wheezes HEART:  Regular rate rhythm, no murmurs, no rubs, no clicks Abd:  soft, positive bowel sounds, no organomegally, no rebound, no guarding Ext:  2 plus pulses, no edema, no cyanosis, no clubbing Skin:  No rashes no nodules Neuro:  CN II through XII intact, motor grossly intact  EKG - nsr with left axis  Assess/Plan: PAF - he is maintaining nsr. He will  continue cardizem.  Coags -  hopefully he will soon be able to stop eliquis.  Stroke - his neuro deficits appear to be stable and he has followup with neuro. He hopes to start some water therapy once his pool is built. HTN - his bp is reasonably well controlled normally. I am not inclined to try and drive it too low.  Carleene Overlie Camari Quintanilla,MD

## 2022-08-06 NOTE — Patient Instructions (Signed)
Medication Instructions:  Your physician recommends that you continue on your current medications as directed. Please refer to the Current Medication list given to you today.  *If you need a refill on your cardiac medications before your next appointment, please call your pharmacy*  Lab Work: None ordered.  If you have labs (blood work) drawn today and your tests are completely normal, you will receive your results only by: MyChart Message (if you have MyChart) OR A paper copy in the mail If you have any lab test that is abnormal or we need to change your treatment, we will call you to review the results.  Testing/Procedures: None ordered.  Follow-Up: At [facility], you and your health needs are our priority.  As part of our continuing mission to provide you with exceptional heart care, we have created designated Provider Care Teams.  These Care Teams include your primary Cardiologist (physician) and Advanced Practice Providers (APPs -  Physician Assistants and Nurse Practitioners) who all work together to provide you with the care you need, when you need it.  We recommend signing up for the patient portal called "MyChart".  Sign up information is provided on this After Visit Summary.  MyChart is used to connect with patients for Virtual Visits (Telemedicine).  Patients are able to view lab/test results, encounter notes, upcoming appointments, etc.  Non-urgent messages can be sent to your provider as well.   To learn more about what you can do with MyChart, go to [email or web address].    Your next appointment:   1 year(s)  The format for your next appointment:   In Person  Provider:   Gregg Taylor, MD{or one of the following Advanced Practice Providers on your designated Care Team:   Renee Ursuy, PA-C Michael "Andy" Tillery, PA-C   Important Information About Sugar       

## 2022-08-06 NOTE — Therapy (Addendum)
OUTPATIENT PHYSICAL THERAPY NEURO TREATMENT NOTE/DISCHARGE SUMMARY   Patient Name: Walter [name] MRN: [id number] DOB:Sep 06, 1955, 67 y.o., male Today's Date: 08/06/2022   PCP: Walter [name], L. Walter Saucer, MD REFERRING PROVIDER: Micki Riley, MD    PT End of Session - 08/06/22 2039     Visit Number 8    Number of Visits 9    Date for PT Re-Evaluation 08/02/22   due to scheduling conflict 1 week and planned surgical procedure in Jan. 2024   Authorization Type Medicare    Authorization Time Period 05-20-22 - 08-18-21    Progress Note Due on Visit 10    PT Start Time 1450    PT Stop Time 1535    PT Time Calculation (min) 45 min    Equipment Utilized During Treatment Other (comment)   bar bell, aquatic step, pool noodle   Activity Tolerance Patient tolerated treatment well    Behavior During Therapy [facility] for tasks assessed/performed              Past Medical History:  Diagnosis Date   Atrial flutter (HCC)    s/p ablation   DVT (deep venous thrombosis) (HCC)    Near syncope 09/25/2013   Paroxysmal atrial fibrillation (HCC)    Presence of Watchman left atrial appendage closure device 07/11/2022   s/p LAAO with Watchman with a 31 mm Watchman Flex device by Dr. Lalla [name].   Stroke [facility])    initially hemorragic (not on OAC) followed by subsequent embolic stroke   Past Surgical History:  Procedure Laterality Date   A-FLUTTER ABLATION N/A 03/09/2020   Procedure: A-FLUTTER ABLATION;  Surgeon: Hillis Range, MD;  [facility] INVAS[facility] CV LAB;  Service: Cardiovascular;  Laterality: N/A;   CARDIOVERSION N/A 09/27/2013   Procedure: CARDIOVERSION;  Surgeon: Walter Bunting, MD;  Location: [facility] ENDOSCOPY;  Service: Cardiovascular;  Laterality: N/A;   IR ANGIO EXTERNAL CAROTID SEL EXT CAROTID UNI R MOD SED  09/26/2020   IR ANGIO INTRA EXTRACRAN SEL COM CAROTID INNOMINATE BILAT MOD SED  09/26/2020   IR ANGIO VERTEBRAL SEL VERTEBRAL UNI R MOD SED  09/26/2020   IR IVC FILTER PLMT / S&I /IMG GUID/MOD SED   10/08/2020   IR IVC FILTER RETRIEVAL / S&I /IMG GUID/MOD SED  09/28/2021   IR IVUS EACH ADDITIONAL NON CORONARY VESSEL  11/02/2020   IR PTA VENOUS EXCEPT DIALYSIS CIRCUIT  11/02/2020   IR RADIOLOGIST EVAL & MGMT  01/17/2021   IR RADIOLOGIST EVAL & MGMT  09/12/2021   IR THROMBECT VENO MECH MOD SED  10/27/2020   IR THROMBECT VENO MECH MOD SED  11/02/2020   IR US GUIDE VASC ACCESS LEFT  10/27/2020   IR US GUIDE VASC ACCESS RIGHT  09/26/2020   IR US GUIDE VASC ACCESS RIGHT  11/02/2020   IR VENO/EXT/UNI RIGHT  11/02/2020   IR VENOCAVAGRAM IVC  10/27/2020   LEFT ATRIAL APPENDAGE OCCLUSION N/A 02/28/2022   Procedure: LEFT ATRIAL APPENDAGE OCCLUSION;  Surgeon: Walter Prude, MD;  [facility] IN[facility] CV LAB;  Service: Cardiovascular;  Laterality: N/A;   LEFT ATRIAL APPENDAGE OCCLUSION N/A 07/11/2022   Procedure: LEFT ATRIAL APPENDAGE OCCLUSION;  Surgeon: Walter Prude, MD;  [facility]: [facility] INVAS[facility] CV LAB;  Service: Cardiovascular;  Laterality: N/A;   RETINAL DETACHMENT SURGERY     right knee arthroscopy     TEE WITHOUT CARDIOVERSION N/A 09/27/2013   Procedure: TRANSESOPHAGEAL ECHOCARDIOGRAM (TEE);  Surgeon: Walter Bunting, MD;  Location: [facility] ENDOSCOPY;  Service: Cardiovascular;  Laterality:  N/A;   TEE WITHOUT CARDIOVERSION N/A 02/28/2022   Procedure: TRANSESOPHAGEAL ECHOCARDIOGRAM (TEE);  Surgeon: Walter Prude, MD;  Location: [facility] INVASIVE CV LAB;  Service: Cardiovascular;  Laterality: N/A;   TEE WITHOUT CARDIOVERSION N/A 07/11/2022   Procedure: TRANSESOPHAGEAL ECHOCARDIOGRAM (TEE);  Surgeon: Walter Prude, MD;  Location: [facility] INVASIVE CV LAB;  Service: Cardiovascular;  Laterality: N/A;   WRIST SURGERY     Patient Active Problem List   Diagnosis Date Noted   Presence of Watchman left atrial appendage closure device 07/11/2022   Hypertension 08/16/2021   Residual cognitive deficit as late effect of stroke 04/19/2021   Hemiparesis affecting right side as late effect of stroke (HCC) 04/19/2021    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 (HCC) 04/19/2021   Sleep disturbance    PAF (paroxysmal atrial fibrillation) (HCC)    Malnutrition of moderate degree 11/04/2020   Tracheostomy care (HCC)    Pressure injury of skin 10/23/2020   Prediabetes    Leukocytosis    Hypernatremia    ICH (intracerebral hemorrhage) (HCC) 09/25/2020   Unilateral primary osteoarthritis, left knee 09/26/2016   Atrial flutter (HCC) 09/25/2013   Near syncope 09/25/2013    ONSET DATE: 09-25-20:  Referral date 04-16-22  REFERRING DIAG: I69.90 (ICD-10-CM) - Late, effect, cerebrovascular disease I69.393 (ICD-10-CM) - Ataxia due to old cerebrovascular accident (CVA)  THERAPY DIAG:  Other abnormalities of gait and mobility  Unsteadiness on feet  Other lack of coordination  Rationale for Evaluation and Treatment Rehabilitation  SUBJECTIVE:                                                                                                                                                                                              SUBJECTIVE STATEMENT: Pt reports he had Watchman implant procedure on 07-11-22 - states everything went well; no changes or problems reported   Pt accompanied by: wife, Walter [name]  PERTINENT HISTORY: Atrial flutter (HCC)s/p ablation DVT (deep venous thrombosis) (HCC) Near syncope03/28/2015 Paroxysmal atrial fibrillation (HCC) Stroke (HCC)initially hemorragic (not on OAC) followed by subsequent embolic stroke; has Lt hemiataxia and sensory deficits on Rt side of body; also 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 No  PRECAUTIONS: Fall (HIGH fall risk)  WEIGHT BEARING RESTRICTIONS No  FALLS: Has patient fallen in last 6 months? Yes. Number of falls approx. 14  falls   LIVING ENVIRONMENT: Lives with: lives with their spouse Lives in: apartment while house is being built - pt states house is supposed to be finished in Dec. '23 Stairs: No steps  currently at apartment Has following  equipment at home: Dan Humphreys - 2 wheeled, Shower bench, and Grab bars,manual wheelchair; pt reports he is going to order a [employer] states they are building new house with 9" slope differential to enter home - is supposed to be ready in Dec.   PLOF: Independent prior to CVA in March 2022  PATIENT GOALS "Improve balance as much as possible"; be able to exercise safely without fall risk    OBJECTIVE:    Aquatic therapy at [facility] - pool temp 90 degrees  Patient seen for aquatic therapy today.  Treatment took place in water 3.6-4.8 feet deep depending upon activity.  Pt entered and exited the the pool via step negotiation with use of bilateral hand rails with CGA to SBA using step by step sequence.  Pt performed water walking for warm up - forwards, backwards and sideways 18' x 4 reps each direction across width of pool  Pt performed marching in place with cues to perform slowly and to tighten core for improved stabilization - 10 reps each leg - no UE support used  Pt performed hip kicks - unilateral LE's with CGA and with use of pool edge prn for assist with balance recovery - forward, back and side kicks 10 reps each direction   Performed cross country skiing 10 reps with CGA and then 1/2 jumping jacks 10 reps with CGA to min assist for balance recovery   Stepping strategy - forward, backward, and side stepping 10 reps each leg with min assist for balance recovery; mod assist for backward stepping   Pt performed static standing for core stabilization - moved bar bells in horizontal abduction/adduction and forward/back (protraction/retraction) 10 reps each direction with min assist intermittently for balance recovery  Tall kneeling on aquatic step on pool floor - pt performed mini squats 10 reps; hip extension and hip abduction each leg in tall kneeling 10 reps each with cues and CGA for upright posture and balance recovery  Pt  requires buoyancy of water for support for reduced fall risk with balance exercises in aquatic environment;  exercises able to be performed safely in water compared to that on land with higher fall risk;  current of water provides perturbations for challenges with static & dynamic standing balace.  Pt also requires viscosity of water for strengthening bil. lower extremities and trunk musculature for improved core stabilization.       GOALS: Goals reviewed with patient? Yes  SHORT TERM GOALS: Target date: 06-21-22  Initiate HEP for aquatic exercises.  Baseline: dependent Goal status:  Goal met  2.  Pt will amb. 10" in pool in 4' water depth without UE support on floatation device with SBA for improved endurance/activity tolerance.  Baseline: to be assessed Goal status:  Goal met  3. Pt will demonstrate improved balance by standing with feet </= 6" apart for 2" in 4' water depth without LOB.  Baseline:  feet 14" apart at eval on 05-20-22 with SBA - pt stood for 2"   Goal status:  Goal met  4.  Pt will negotiate steps in pool for entering and exiting with use of hand rails with min assist. Baseline: to be assessed Goal status: Goal met     LONG TERM GOALS:  Target date:  07-26-22;  EXTENDED DATE:  08-12-22  Pt will perform updated HEP for aquatic exercises with assist from family/caregiver as needed for safety. Baseline:  Goal status: Goal met  2.  Pt will amb. 15" nonstop in pool in  4' water depth with supervision without LOB for increased endurance/activity tolerance.  Baseline: to be assessed Goal status: Not met - occasional CGA needed for recovery of LOB  3.  Pt will negotiate steps in pool area with hand rails with CGA using step by step sequence.  Baseline:  to be assessed  Goal status:  Goal met 07-29-22   ASSESSMENT:  CLINICAL IMPRESSION: Pt has met LTG's # 1 and 3:  LTG #2 not met as pt continues to have occasional LOB with water walking in 4' - 4.8' water depth. Pt  is discharged due to completion of program and plateau in maximizing functional progress at this time.     OBJECTIVE IMPAIRMENTS Abnormal gait, decreased activity tolerance, decreased balance, decreased coordination, decreased knowledge of use of DME, difficulty walking, decreased strength, impaired sensation, and impaired vision/preception.   ACTIVITY LIMITATIONS carrying, lifting, bending, standing, squatting, stairs, transfers, and dressing  PARTICIPATION LIMITATIONS: meal prep, cleaning, laundry, driving, shopping, and community activity  PERSONAL FACTORS Behavior pattern, Past/current experiences, and Time since onset of injury/illness/exacerbation are also affecting patient's functional outcome.   REHAB POTENTIAL: Fair due to chronicity of deficits & time since onset  CLINICAL DECISION MAKING: Evolving/moderate complexity  EVALUATION COMPLEXITY: Moderate  PLAN: PT FREQUENCY: 1x/week  PT DURATION: 8 weeks  PLANNED INTERVENTIONS: Patient/Family education, Self Care, and Aquatic Therapy  PLAN FOR NEXT SESSION: D/C on 08-05-22   PHYSICAL THERAPY DISCHARGE SUMMARY  Visits from Start of Care: 8 (aquatic PT visits only - no land appts in this POC, other than initial eval)  Current functional level related to goals / functional outcomes: See above for progress towards goals   Remaining deficits: Continued ataxic gait pattern and continued decreased static and dynamic standing balance Continued decreased coordination in bil. LE's Visual deficits continue to impact balance   Education / Equipment: Pt has been instructed in aquatic exercise as pt reports he is having a pool built at his new home for continued participation in aquatic exercise.    Patient agrees to discharge. Patient goals were met. Patient is being discharged due to meeting the stated rehab goals. Pt has plateaued in maximizing functional status at this time.    Kary Kos, PT 08/06/2022, 8:45  PM

## 2022-08-08 ENCOUNTER — Encounter (HOSPITAL_COMMUNITY): Payer: Self-pay | Admitting: *Deleted

## 2022-08-16 NOTE — Progress Notes (Unsigned)
[facility]                                     Cardiology Office Note:    Date:  08/19/2022   ID:  Walter [name], DOB 12/16/1955, MRN [id number]  PCP:  Aurea Graff.Marlou Sa, MD  [place] Cardiologist:  None  [facility] Electrophysiologist:  [name], MD   Referring MD: Alroy Dust, Carlean Jews.Marlou Sa, MD   Chief Complaint  Patient presents with   Follow-up    S/p LAAO    History of Present Illness:    Walter [name] is a 67 y.o. male with a hx of HTN, paroxysmal atrial fib/flutter s/p ablation, DVT/PE, spontaneous intracranial hemorrhage 08/2020, and cardio embolic CVA who presented to [facility] on 07/[facility]22 for planned LAAO and is being seen for follow up today.    Walter [name] was diagnosed with AF 08/2020. He presented to [facility] 09/[facility]/20 after awaking from sleep with slurred speech and HA. On ED arrival he was intubated due to lethargy. Emergent head CT was performed which showed a left midbrain hemorrhage involving the dorsal part of the pons as well with extension into the cerebral aqueduct and fourth ventricle with local mass-effect on the brainstem but no hydrocephalus. CT angiogram of the head and neck showed no large vessel occlusion or AV malformation. He was started on hypertonic saline and had repeat CT scan which showed worsening hydrocephalus and neurosurgery was consulted who placed a right frontal ventriculostomy catheter. MRI scan of the brain was obtained which confirmed acute hemorrhage within the left dorsal midbrain and pons extending to inferior medial thalamus with intraventricular extension. After EVD placement, he showed neurological improvement. Hospital course was c/b a DVT and PE requiring anticoagulation. He also required a tracheostomy and IVC filter which has since been removed.    He was transferred to to inpatient rehab on 10/26/21 where he stayed 4 weeks and went home 11/23/20. In last follow up with Dr. Leonie Man 09/2021, he was felt to be a good candidate for  Watchman implant. He was referred for LAAO by Dr. Lovena Le and seen by Dr. Quentin Ore. He was scheduled for LAAO closure 02/28/22. Unfortunately, procedure attempted however case was aborted after echodensity seen on TEE with concern for possible LAAO thrombus. He was restarted on Eliquis with initial plan to follow with a repeat TEE scheduled for 10/4. This was later cancelled due to patient concern of swallowing issues. CT not felt to give great quality images based on the size of the echodensity. Plan changed to re attempt LAAO with a TEE on the table prior to the procedure.   He is now s/p LAAO closure with Watchman FLX 59m device. He is here today with his wife and reports he has been doing well since implant with no chest pain, palpitations, LE edema, bleeding, orthopnea, SOB, dizziness or syncope. He was restarted on Eliquis 59mPO twice daily with plans to stop after 45 [name]. At that time he will start Plavix 7563mD through 6 months. He will undergo post LAAO CT at 60 [name].   Past Medical History:  Diagnosis Date   [facility]  [facility]   [place]  Near syncope 09/25/2013   Paroxysmal atrial fibrillation (HCC)    Presence of Watchman left atrial appendage closure device 07/11/2022   s/p LAAO with Watchman with a  31 mm Watchman Flex device by Dr. Quentin Ore.   Stroke [facility])    initially hemorragic (not on [place]) followed by subsequent embolic stroke    Past Surgical History:  Procedure Laterality Date   A-FLUTTER ABLATION N/A 03/09/2020   Procedure: A-FLUTTER ABLATION;  Surgeon: Thompson Grayer, MD;  Location: [place] CV LAB;  Service: Cardiovascular;  Laterality: N/A;   CARDIOVERSION N/A 09/27/2013   Procedure: CARDIOVERSION;  Surgeon: Lelon Perla, MD;  Location: [facility] ENDOSCOPY;  Service: Cardiovascular;  Laterality: N/A;   IR ANGIO EXTERNAL CAROTID SEL EXT CAROTID UNI R MOD SED  09/26/2020   IR ANGIO INTRA EXTRACRAN SEL COM CAROTID INNOMINATE BILAT  MOD SED  09/26/2020   IR ANGIO VERTEBRAL SEL VERTEBRAL UNI R MOD SED  09/26/2020   IR IVC FILTER PLMT / S&I /IMG GUID/MOD SED  10/08/2020   IR IVC FILTER RETRIEVAL / S&I /IMG GUID/MOD SED  09/28/2021   IR IVUS EACH ADDITIONAL NON CORONARY VESSEL  11/02/2020   IR PTA VENOUS EXCEPT DIALYSIS CIRCUIT  11/02/2020   IR RADIOLOGIST EVAL & MGMT  01/17/2021   IR RADIOLOGIST EVAL & MGMT  09/12/2021   IR THROMBECT VENO MECH MOD SED  10/27/2020   IR THROMBECT VENO MECH MOD SED  11/02/2020   IR US GUIDE VASC ACCESS LEFT  10/27/2020   IR US GUIDE VASC ACCESS RIGHT  09/26/2020   IR US GUIDE VASC ACCESS RIGHT  11/02/2020   IR VENO/EXT/UNI RIGHT  11/02/2020   IR VENOCAVAGRAM IVC  10/27/2020   LEFT ATRIAL APPENDAGE OCCLUSION N/A 02/28/2022   Procedure: LEFT ATRIAL APPENDAGE OCCLUSION;  Surgeon: [name], MD;  Location: [place] CV LAB;  Service: Cardiovascular;  Laterality: N/A;   LEFT ATRIAL APPENDAGE OCCLUSION N/A 07/11/2022   Procedure: LEFT ATRIAL APPENDAGE OCCLUSION;  Surgeon: [name], MD;  Location: [place] CV LAB;  Service: Cardiovascular;  Laterality: N/A;   RETINAL DETACHMENT SURGERY     right knee arthroscopy     TEE WITHOUT CARDIOVERSION N/A 09/27/2013   Procedure: TRANSESOPHAGEAL ECHOCARDIOGRAM (TEE);  Surgeon: Lelon Perla, MD;  Location: [facility] ENDOSCOPY;  Service: Cardiovascular;  Laterality: N/A;   TEE WITHOUT CARDIOVERSION N/A 02/28/2022   Procedure: TRANSESOPHAGEAL ECHOCARDIOGRAM (TEE);  Surgeon: [name], MD;  Location: [place] CV LAB;  Service: Cardiovascular;  Laterality: N/A;   TEE WITHOUT CARDIOVERSION N/A 07/11/2022   Procedure: TRANSESOPHAGEAL ECHOCARDIOGRAM (TEE);  Surgeon: [name], MD;  Location: [place] CV LAB;  Service: Cardiovascular;  Laterality: N/A;   WRIST SURGERY      Current Medications: Current Meds  Medication Sig   atorvastatin (LIPITOR) 20 MG tablet Take 1 tablet (20 mg total) by mouth at bedtime.   DILT-XR 180 MG 24 hr capsule  Take 180 mg by mouth daily.   ELIQUIS 5 MG TABS tablet TAKE 1 TABLET BY MOUTH TWICE A DAY   EPINEPHrine 0.3 mg/0.3 mL IJ SOAJ injection Inject 0.3 mg into the muscle as needed for anaphylaxis.   Glycerin-Polysorbate 80 (REFRESH DRY EYE THERAPY OP) Place 1 drop into both eyes daily as needed (Dry eyes).   mirtazapine (REMERON) 7.5 MG tablet TAKE 2 TABLETS BY MOUTH AT BEDTIME (Patient taking differently: Take 7.5 mg by mouth at bedtime.)   sertraline (ZOLOFT) 100 MG tablet Take 100 mg by mouth at bedtime.   VENTOLIN HFA 108 (90 Base) MCG/ACT inhaler Inhale 1-2 puffs into the lungs every 6 (six) hours as needed for wheezing or shortness of breath.  Allergies:   Keppra [levetiracetam] and Latex   Social History   Socioeconomic History   Marital status: Married    Spouse name: cathy   Number of children: Not on file   Years of education: Not on file   Highest education level: Not on file  Occupational History   Not on file  Tobacco Use   Smoking status: Never   Smokeless tobacco: Never  Vaping Use   Vaping Use: Never used  Substance and Sexual Activity   Alcohol use: Yes    Comment: occasionally   Drug use: No   Sexual activity: Not on file  Other Topics Concern   Not on file  Social History Narrative   Lives with wife at home   Right Handed   Drinks decaf   Social Determinants of Health   Financial Resource Strain: Not on file  Food Insecurity: Not on file  Transportation Needs: Not on file  Physical Activity: Not on file  Stress: Not on file  Social Connections: Not on file    Family History: The patient's family history includes Healthy in his brother and sister; Heart disease in his father and mother.  ROS:   Please see the history of present illness.    All other systems reviewed and are negative.  EKGs/Labs/Other Studies Reviewed:    The following studies were reviewed today:  Watchman 07/11/22:  CONCLUSIONS:  1.Successful implantation of a WATCHMAN  left atrial appendage occlusive device    2. TEE demonstrating no LAA thrombus 3. No early apparent complications.    Post Implant Anticoagulation Strategy: Continue Eliquis 31m PO twice daily for 45 [name], then transition to Plavix 722mPO daily to complete 6 months of post implant therapy. Plan for CT scan in 60 [name] to assess Watchman position.  EKG:  EKG is not ordered today.    Recent Labs: 06/28/2022: BUN 13; Creatinine, Ser 0.92; Hemoglobin 15.7; Platelets 223; Potassium 4.4; Sodium 143  Recent Lipid Panel    Component Value Date/Time   CHOL 155 09/25/2020 1659   TRIG 117 09/25/2020 1659   HDL 44 09/25/2020 1659   CHOLHDL 3.5 09/25/2020 1659   VLDL 23 09/25/2020 1659   LDLCALC 88 09/25/2020 1659    Physical Exam:    VS:  BP 118/70   Pulse 71   Ht 6' (1.829 m)   Wt 221 lb (100.2 kg)   SpO2 96%   BMI 29.97 kg/m     Wt Readings from Last 3 Encounters:  08/19/22 221 lb (100.2 kg)  08/06/22 225 lb (102.1 kg)  07/11/22 225 lb (102.1 kg)    General: Well developed, well nourished, NAD Lungs:Clear to ausculation bilaterally. No wheezes, rales, or rhonchi. Breathing is unlabored. Cardiovascular: RRR with S1 S2. No murmurs Extremities: No edema. Neuro: Alert and oriented. No focal deficits. No facial asymmetry. MAE spontaneously. Psych: Responds to questions appropriately with normal affect.    ASSESSMENT/PLAN:    PAF with history of intracranial hemorrhage: initially had an aborted procedure however this was rescheduled and he is now s/p successful LAAO closure with Watchman FLX 3125mithout issues. He is doing well today and will take last doses of Eliquis 2/25. He will start Plavix 22m91m on 2/26. CT instructions reviewed. BMET today. Will follow CT results. Plan 6 month follow up 12/2022.   Medication Adjustments/Labs and Tests Ordered: Current medicines are reviewed at length with the patient today.  Concerns regarding medicines are outlined above.  No orders of  the  defined types were placed in this encounter.  No orders of the defined types were placed in this encounter.   Patient Instructions  Medication Instructions:   *If you need a refill on your cardiac medications before your next appointment, please call your pharmacy*   Lab Work:  If you have labs (blood work) drawn today and your tests are completely normal, you will receive your results only by: [place] (if you have MyChart) OR A paper copy in the mail If you have any lab test that is abnormal or we need to change your treatment, we will call you to review the results.   Testing/Procedures:    Follow-Up: At [facility], you and your health needs are our priority.  As part of our continuing mission to provide you with exceptional heart care, we have created designated Provider Care Teams.  These Care Teams include your primary Cardiologist (physician) and Advanced Practice Providers (APPs -  Physician Assistants and Nurse Practitioners) who all work together to provide you with the care you need, when you need it.  We recommend signing up for the patient portal called "MyChart".  Sign up information is provided on this After Visit Summary.  MyChart is used to connect with patients for Virtual Visits (Telemedicine).  Patients are able to view lab/test results, encounter notes, upcoming appointments, etc.  Non-urgent messages can be sent to your provider as well.   To learn more about what you can do with MyChart, go to [email or web address].    Your next appointment:   12/2022 with myself for 6 month s/p LAAO closure with Watchman   Signed, Kathyrn Drown, NP  08/19/2022 10:09 AM    [facility]

## 2022-08-19 ENCOUNTER — Ambulatory Visit: Payer: Medicare Other | Attending: Cardiology | Admitting: Cardiology

## 2022-08-19 VITALS — BP 118/70 | HR 71 | Ht 72.0 in | Wt 221.0 lb

## 2022-08-19 DIAGNOSIS — Z01818 Encounter for other preprocedural examination: Secondary | ICD-10-CM | POA: Insufficient documentation

## 2022-08-19 DIAGNOSIS — I69351 Hemiplegia and hemiparesis following cerebral infarction affecting right dominant side: Secondary | ICD-10-CM | POA: Diagnosis present

## 2022-08-19 DIAGNOSIS — I10 Essential (primary) hypertension: Secondary | ICD-10-CM | POA: Diagnosis present

## 2022-08-19 DIAGNOSIS — I480 Paroxysmal atrial fibrillation: Secondary | ICD-10-CM | POA: Insufficient documentation

## 2022-08-19 DIAGNOSIS — Z95818 Presence of other cardiac implants and grafts: Secondary | ICD-10-CM | POA: Insufficient documentation

## 2022-08-19 LAB — BASIC METABOLIC PANEL
BUN/Creatinine Ratio: 14 (ref 10–24)
BUN: 12 mg/dL (ref 8–27)
CO2: 22 mmol/L (ref 20–29)
Calcium: 8.9 mg/dL (ref 8.6–10.2)
Chloride: 103 mmol/L (ref 96–106)
Creatinine, Ser: 0.88 mg/dL (ref 0.76–1.27)
Glucose: 110 mg/dL — ABNORMAL HIGH (ref 70–99)
Potassium: 4.1 mmol/L (ref 3.5–5.2)
Sodium: 139 mmol/L (ref 134–144)
eGFR: 95 mL/min/{1.73_m2} (ref >59)

## 2022-08-19 MED ORDER — APIXABAN 5 MG PO TABS: 5.0000 mg | ORAL_TABLET | Freq: Two times a day (BID) | ORAL | 1 refills | Status: DC

## 2022-08-19 MED ORDER — METOPROLOL TARTRATE 50 MG PO TABS: 50.0000 mg | ORAL_TABLET | Freq: Once | ORAL | 0 refills | Status: DC

## 2022-08-19 MED ORDER — CLOPIDOGREL BISULFATE 75 MG PO TABS: 75.0000 mg | ORAL_TABLET | Freq: Every day | ORAL | 3 refills | Status: DC

## 2022-08-19 NOTE — Patient Instructions (Addendum)
Medication Instructions:  Your physician has recommended you make the following change in your medication:  TAKE METOPROLOL 50 MG ONCE 2 HOURS PRIOR CT SCAN   STOP ELIQUIS ON 2/25 START PLAVIX 75 MG ON 2/26 *If you need a refill on your cardiac medications before your next appointment, please call your pharmacy*   Lab Work: TODAY: BMET If you have labs (blood work) drawn today and your tests are completely normal, you will receive your results only by: [place] (if you have MyChart) OR A paper copy in the mail If you have any lab test that is abnormal or we need to change your treatment, we will call you to review the results.   Testing/Procedures: SEE CT INSTRUCTION LETTER   Follow-Up: At [facility], you and your health needs are our priority.  As part of our continuing mission to provide you with exceptional heart care, we have created designated Provider Care Teams.  These Care Teams include your primary Cardiologist (physician) and Advanced Practice Providers (APPs -  Physician Assistants and Nurse Practitioners) who all work together to provide you with the care you need, when you need it.  We recommend signing up for the patient portal called "MyChart".  Sign up information is provided on this After Visit Summary.  MyChart is used to connect with patients for Virtual Visits (Telemedicine).  Patients are able to view lab/test results, encounter notes, upcoming appointments, etc.  Non-urgent messages can be sent to your provider as well.   To learn more about what you can do with MyChart, go to [email or web address].    Your next appointment:   KEEP SCHEDULED APPOINTMENT

## 2022-08-21 ENCOUNTER — Other Ambulatory Visit: Payer: Self-pay | Admitting: Internal Medicine

## 2022-09-07 ENCOUNTER — Emergency Department (HOSPITAL_COMMUNITY): Payer: Medicare Other

## 2022-09-07 ENCOUNTER — Encounter (HOSPITAL_COMMUNITY): Payer: Self-pay

## 2022-09-07 ENCOUNTER — Emergency Department (HOSPITAL_COMMUNITY)
Admission: EM | Admit: 2022-09-07 | Discharge: 2022-09-08 | Disposition: A | Discharge status: Home / Self Care | Payer: Medicare Other | Attending: Emergency Medicine | Admitting: Emergency Medicine

## 2022-09-07 DIAGNOSIS — S0990XA Unspecified injury of head, initial encounter: Secondary | ICD-10-CM | POA: Insufficient documentation

## 2022-09-07 DIAGNOSIS — S52332A Displaced oblique fracture of shaft of left radius, initial encounter for closed fracture: Secondary | ICD-10-CM | POA: Diagnosis not present

## 2022-09-07 DIAGNOSIS — S52602A Unspecified fracture of lower end of left ulna, initial encounter for closed fracture: Secondary | ICD-10-CM | POA: Insufficient documentation

## 2022-09-07 DIAGNOSIS — Z7901 Long term (current) use of anticoagulants: Secondary | ICD-10-CM | POA: Diagnosis not present

## 2022-09-07 DIAGNOSIS — Z91040 Latex allergy status: Secondary | ICD-10-CM | POA: Diagnosis not present

## 2022-09-07 DIAGNOSIS — M25532 Pain in left wrist: Secondary | ICD-10-CM | POA: Diagnosis present

## 2022-09-07 DIAGNOSIS — Z8673 Personal history of transient ischemic attack (TIA), and cerebral infarction without residual deficits: Secondary | ICD-10-CM | POA: Insufficient documentation

## 2022-09-07 DIAGNOSIS — Z7902 Long term (current) use of antithrombotics/antiplatelets: Secondary | ICD-10-CM | POA: Diagnosis not present

## 2022-09-07 DIAGNOSIS — S52572A Other intraarticular fracture of lower end of left radius, initial encounter for closed fracture: Secondary | ICD-10-CM | POA: Diagnosis not present

## 2022-09-07 DIAGNOSIS — W19XXXA Unspecified fall, initial encounter: Secondary | ICD-10-CM

## 2022-09-07 MED ORDER — OXYCODONE-ACETAMINOPHEN 5-325 MG PO TABS
1.0000 | ORAL_TABLET | Freq: Once | ORAL | Status: AC
  Administered 2022-09-08: 1 via ORAL
  Filled 2022-09-07: qty 1

## 2022-09-07 MED ORDER — ONDANSETRON HCL 4 MG/2ML IJ SOLN
4.0000 mg | Freq: Once | INTRAMUSCULAR | Status: AC
  Administered 2022-09-07: 4 mg via INTRAVENOUS
  Filled 2022-09-07: qty 2

## 2022-09-07 MED ORDER — MORPHINE SULFATE (PF) 4 MG/ML IV SOLN
4.0000 mg | Freq: Once | INTRAVENOUS | Status: AC
  Administered 2022-09-07: 4 mg via INTRAVENOUS
  Filled 2022-09-07: qty 1

## 2022-09-07 MED ORDER — OXYCODONE-ACETAMINOPHEN 5-325 MG PO TABS: 1.0000 | ORAL_TABLET | Freq: Four times a day (QID) | ORAL | 0 refills | Status: DC | PRN

## 2022-09-07 NOTE — Progress Notes (Signed)
Orthopedic Tech Progress Note Patient Details:  Walter [name] 22-Mar-1956 [id number]  Ortho Devices Type of Ortho Device: Arm sling, Sugartong splint Ortho Device/Splint Location: lue Ortho Device/Splint Interventions: Ordered, Application, Adjustment   Post Interventions Patient Tolerated: Well Instructions Provided: Care of device, Adjustment of device  Karolee Stamps 09/07/2022, 11:17 PM

## 2022-09-07 NOTE — Progress Notes (Signed)
   09/07/22 2226  Spiritual Encounters  Type of Visit Declined chaplain visit  Reason for visit Trauma  OnCall Visit Yes   Chaplain spoke with patient and he declined chaplain services.   Note Prepared by Abbott Pao, Chaplain Resident [phone]

## 2022-09-07 NOTE — Discharge Instructions (Addendum)
Elevate the arm when you can.  No lifting or using the arm.  Use the pain medication as needed.  Follow up with Dr. Fredna Dow.

## 2022-09-07 NOTE — ED Provider Notes (Signed)
[place] Provider Note   CSN: [id number] Arrival date & time: 09/07/22  2202     History  Chief Complaint  Patient presents with   [place] is a 67 y.o. male.  Patient is a 67 year old male with a history of prior stroke, chronic ataxia, paroxysmal atrial fibrillation status post watchman's procedure, prior DVT who is currently on Plavix and is presenting today as a level 2 trauma after he was trying to get out of his car and lost his balance and fell on his left side injuring his left arm and hitting his head.  He denies any loss of consciousness.  He denies headache or pain anywhere in his body except for his left arm.  He denies any numbness of the arm but has severe pain in the forearm and wrist area.  The history is provided by the patient.  Fall  Trauma Mechanism of injury: Fall       Home Medications Prior to Admission medications   Medication Sig Start Date End Date Taking? Authorizing Provider  oxyCODONE-acetaminophen (PERCOCET/ROXICET) 5-325 MG tablet Take 1 tablet by mouth every 6 (six) hours as needed for severe pain. 09/07/22  Yes Blanchie Dessert, MD  apixaban (ELIQUIS) 5 MG TABS tablet Take 1 tablet (5 mg total) by mouth 2 (two) times daily. STOP ON 2/25 AND THEN START PLAVIX ON 2/26 08/19/22   Kathyrn Drown D, NP  atorvastatin (LIPITOR) 20 MG tablet Take 1 tablet (20 mg total) by mouth at bedtime. 08/16/21   Evans Lance, MD  clopidogrel (PLAVIX) 75 MG tablet Take 1 tablet (75 mg total) by mouth daily. STOP ELIQUIS 2/25, START PLAVIX 2/26 08/19/22   Kathyrn Drown D, NP  DILT-XR 180 MG 24 hr capsule Take 180 mg by mouth daily. 06/07/22   [provider]  EPINEPHrine 0.3 mg/0.3 mL IJ SOAJ injection Inject 0.3 mg into the muscle as needed for anaphylaxis.    [provider]  Glycerin-Polysorbate 80 (REFRESH DRY EYE THERAPY OP) Place 1 drop into both eyes daily as needed (Dry  eyes).    [provider]  metoprolol tartrate (LOPRESSOR) 50 MG tablet Take 1 tablet (50 mg total) by mouth once for 1 dose. 2 HOURS PRIOR TO CT SCAN 08/19/22 08/19/22  Kathyrn Drown D, NP  mirtazapine (REMERON) 7.5 MG tablet TAKE 2 TABLETS BY MOUTH AT BEDTIME Patient taking differently: Take 7.5 mg by mouth at bedtime. 05/29/22   Garvin Fila, MD  sertraline (ZOLOFT) 100 MG tablet Take 100 mg by mouth at bedtime. 01/31/21   [provider]  VENTOLIN HFA 108 (90 Base) MCG/ACT inhaler Inhale 1-2 puffs into the lungs every 6 (six) hours as needed for wheezing or shortness of breath. 04/30/22   [provider]      Allergies    Keppra [levetiracetam] and Latex    Review of Systems   Review of Systems  Physical Exam Updated Vital Signs BP 136/81 (BP Location: Right Arm)   Pulse 86   Temp 98.1 F (36.7 C) (Axillary)   Resp 19   Ht 6' (1.829 m)   Wt 99.8 kg   SpO2 95%   BMI 29.84 kg/m  Physical Exam Vitals and nursing note reviewed.  Constitutional:      General: He is not in acute distress.    Appearance: He is well-developed.  HENT:     Head: Normocephalic and atraumatic.  Comments: No obvious signs of trauma to the head Eyes:     Conjunctiva/sclera: Conjunctivae normal.     Pupils: Pupils are equal, round, and reactive to light.  Cardiovascular:     Rate and Rhythm: Normal rate and regular rhythm.     Heart sounds: No murmur heard. Pulmonary:     Effort: Pulmonary effort is normal. No respiratory distress.     Breath sounds: Normal breath sounds. No wheezing or rales.  Abdominal:     General: There is no distension.     Palpations: Abdomen is soft.     Tenderness: There is no abdominal tenderness. There is no guarding or rebound.  Musculoskeletal:        General: Tenderness and deformity present.     Left forearm: Deformity, tenderness and bony tenderness present.     Left wrist: Swelling, deformity, tenderness and bony tenderness present.  Decreased range of motion. Normal pulse.     Cervical back: Normal range of motion and neck supple. No tenderness.     Comments: Patient has deformity of the wrist.  Currently no lacerations or evidence of open fracture.  No elbow pain, shoulder pain.  Patient is able to move fingers and sensation is intact  Skin:    General: Skin is warm and dry.     Findings: No erythema or rash.  Neurological:     Mental Status: He is alert and oriented to person, place, and time.  Psychiatric:        Behavior: Behavior normal.     ED Results / Procedures / Treatments   Labs (all labs ordered are listed, but only abnormal results are displayed) Labs Reviewed - No data to display  EKG None  Radiology No results found.  Procedures Procedures    Medications Ordered in ED Medications  oxyCODONE-acetaminophen (PERCOCET/ROXICET) 5-325 MG per tablet 1 tablet (has no administration in time range)  morphine (PF) 4 MG/ML injection 4 mg (4 mg Intravenous Given 09/07/22 2238)  ondansetron (ZOFRAN) injection 4 mg (4 mg Intravenous Given 09/07/22 2238)    ED Course/ Medical Decision Making/ A&P                             Medical Decision Making Amount and/or Complexity of Data Reviewed Radiology: ordered and independent interpretation performed. Decision-making details documented in ED Course.  Risk Prescription drug management.   Pt with multiple medical problems and comorbidities and presenting today with a complaint that caries a high risk for morbidity and mortality.  Patient presenting today after a fall.  Patient did have injury to his head but no loss of consciousness and low mechanism.  Mental status is normal at this time.  Patient also has significant injury to the left forearm and wrist with noted deformity.  He is neurovascularly intact in the injury is closed.  I have independently visualized and interpreted pt's images today.  CT of the head is negative for intracranial bleeding.   X-rays of the forearm shows a midshaft radial fracture with some displacement but also concern for distal radius and ulnar fracture with displacement and some angulation.  Will discuss with hand surgery.  Findings discussed with the patient and his family member who are present at bedside. 11:49 PM Spoke with Dr. Tawni Pummel who evaluated his film.  He would like a CT of the patient's wrist prior to going home.  After splinting patient is neurovascularly intact.  Pain is improved.  Will discharge patient after CT to follow-up with Ortho on Monday.         Final Clinical Impression(s) / ED Diagnoses Final diagnoses:  Fall, initial encounter  Displaced oblique fracture of shaft of left radius, initial encounter for closed fracture  Closed fracture of distal end of left ulna, unspecified fracture morphology, initial encounter  Other closed intra-articular fracture of distal end of left radius, initial encounter    Rx / DC Orders ED Discharge Orders          Ordered    oxyCODONE-acetaminophen (PERCOCET/ROXICET) 5-325 MG tablet  Every 6 hours PRN        09/07/22 2349              Blanchie Dessert, MD 09/07/22 2349

## 2022-09-07 NOTE — ED Triage Notes (Signed)
Per EMS pt had a fall getting out of the car. Pt fell and used his hands to stop his fall. Pt left wrist deformed. Pt report that he hit his head on a second impact. Pt takes plavix and eliquis   EMS VS  220/130 BP 74 HR 94% RA 97T 16RR

## 2022-09-07 NOTE — ED Notes (Signed)
..Trauma Response Nurse Documentation   Walter [name] is a 67 y.o. male arriving to [facility] ED via EMS  On clopidogrel 75 mg daily. Trauma was activated as a Level 2 by charge nurse based on the following trauma criteria Elderly patients > 65 with head trauma on anti-coagulation (excluding ASA). Trauma team at the bedside on patient arrival.   Patient cleared for CT by Dr. Maryan Rued. Pt transported to CT with trauma response nurse present to monitor. RN remained with the patient throughout their absence from the department for clinical observation.   GCS 15.  History   Past Medical History:  Diagnosis Date   Atrial flutter [facility])    s/p ablation   DVT (deep venous thrombosis) ([place])    Near syncope 09/25/2013   Paroxysmal atrial fibrillation (HCC)    Presence of Watchman left atrial appendage closure device 07/11/2022   s/p LAAO with Watchman with a 31 mm Watchman Flex device by Dr. Quentin Ore.   Stroke [facility])    initially hemorragic (not on [place]) followed by subsequent embolic stroke     Past Surgical History:  Procedure Laterality Date   A-FLUTTER ABLATION N/A 03/09/2020   Procedure: A-FLUTTER ABLATION;  Surgeon: Thompson Grayer, MD;  Location: [place] CV LAB;  Service: Cardiovascular;  Laterality: N/A;   CARDIOVERSION N/A 09/27/2013   Procedure: CARDIOVERSION;  Surgeon: Lelon Perla, MD;  Location: [facility] ENDOSCOPY;  Service: Cardiovascular;  Laterality: N/A;   IR ANGIO EXTERNAL CAROTID SEL EXT CAROTID UNI R MOD SED  09/26/2020   IR ANGIO INTRA EXTRACRAN SEL COM CAROTID INNOMINATE BILAT MOD SED  09/26/2020   IR ANGIO VERTEBRAL SEL VERTEBRAL UNI R MOD SED  09/26/2020   IR IVC FILTER PLMT / S&I /IMG GUID/MOD SED  10/08/2020   IR IVC FILTER RETRIEVAL / S&I /IMG GUID/MOD SED  09/28/2021   IR IVUS EACH ADDITIONAL NON CORONARY VESSEL  11/02/2020   IR PTA VENOUS EXCEPT DIALYSIS CIRCUIT  11/02/2020   IR RADIOLOGIST EVAL & MGMT  01/17/2021   IR RADIOLOGIST EVAL & MGMT  09/12/2021   IR THROMBECT  VENO MECH MOD SED  10/27/2020   IR THROMBECT VENO MECH MOD SED  11/02/2020   IR US GUIDE VASC ACCESS LEFT  10/27/2020   IR US GUIDE VASC ACCESS RIGHT  09/26/2020   IR US GUIDE VASC ACCESS RIGHT  11/02/2020   IR VENO/EXT/UNI RIGHT  11/02/2020   IR VENOCAVAGRAM IVC  10/27/2020   LEFT ATRIAL APPENDAGE OCCLUSION N/A 02/28/2022   Procedure: LEFT ATRIAL APPENDAGE OCCLUSION;  Surgeon: [name], MD;  Location: [place] CV LAB;  Service: Cardiovascular;  Laterality: N/A;   LEFT ATRIAL APPENDAGE OCCLUSION N/A 07/11/2022   Procedure: LEFT ATRIAL APPENDAGE OCCLUSION;  Surgeon: [name], MD;  Location: [place] CV LAB;  Service: Cardiovascular;  Laterality: N/A;   RETINAL DETACHMENT SURGERY     right knee arthroscopy     TEE WITHOUT CARDIOVERSION N/A 09/27/2013   Procedure: TRANSESOPHAGEAL ECHOCARDIOGRAM (TEE);  Surgeon: Lelon Perla, MD;  Location: [facility] ENDOSCOPY;  Service: Cardiovascular;  Laterality: N/A;   TEE WITHOUT CARDIOVERSION N/A 02/28/2022   Procedure: TRANSESOPHAGEAL ECHOCARDIOGRAM (TEE);  Surgeon: [name], MD;  Location: [place] CV LAB;  Service: Cardiovascular;  Laterality: N/A;   TEE WITHOUT CARDIOVERSION N/A 07/11/2022   Procedure: TRANSESOPHAGEAL ECHOCARDIOGRAM (TEE);  Surgeon: [name], MD;  Location: [place] CV LAB;  Service: Cardiovascular;  Laterality: N/A;   WRIST SURGERY  Initial Focused Assessment (If applicable, or please see trauma documentation): See trauma documentation   CT's Completed:   CT Head   Interventions:  Est IV Transport to/from CT w/continual monitoring   Plan for disposition:  Other   Consults completed:  Hand sx [place] 2235  Event Summary: Pt transported by [facility] after fall while getting out of his car and attempting to use his cane. Hx of hemorrhagic stroke 2 years ago. Speech deficits noted, pt states they are from old stroke. Pt states he did hit head  and does take Plavix daily, denies LOC. GCS  15, Pupils 3 equal.  Significant deformity to L wrist, temp splinting in place by PTAR, CMS intact. No IV or pain medication given  by EMS.  IV R hand est, labs drawn.  Pt transported to/from CT without incident,  2254 ortho at bedside for splinting.     Bedside handoff with ED RN Cat, RN  [name], [place] Response RN  Please call TRN at [phone] for further assistance.

## 2022-09-08 DIAGNOSIS — S52332A Displaced oblique fracture of shaft of left radius, initial encounter for closed fracture: Secondary | ICD-10-CM | POA: Diagnosis not present

## 2022-09-08 NOTE — Consult Note (Signed)
Walter [name] is an 67 y.o. male.   Chief Complaint: wrist fracture HPI: 67 yo male present with wife.  They state he fell getting out of the car this evening injuring left wrist.  Seen in [facility] where XR revealed distal radius shaft fracture and ulnar styloid fracture.  Has been placed in sugar tong splint which he states is comfortable and helpful.  Notes that he had a hemorrhagic stroke two years ago causing ataxia on left side but is still able to use the arm for walker.  Allergies:  Allergies  Allergen Reactions   Keppra [Levetiracetam] Swelling    Patient experienced angioedema post inpatient keppra dose   Latex Itching    Skin turns real red    Past Medical History:  Diagnosis Date   Atrial flutter [facility])    s/p ablation   DVT (deep venous thrombosis) (HCC)    Near syncope 09/25/2013   Paroxysmal atrial fibrillation (HCC)    Presence of Watchman left atrial appendage closure device 07/11/2022   s/p LAAO with Watchman with a 31 mm Watchman Flex device by Dr. Quentin Ore.   Stroke [facility])    initially hemorragic (not on [place]) followed by subsequent embolic stroke    Past Surgical History:  Procedure Laterality Date   A-FLUTTER ABLATION N/A 03/09/2020   Procedure: A-FLUTTER ABLATION;  Surgeon: Thompson Grayer, MD;  Location: [place] CV LAB;  Service: Cardiovascular;  Laterality: N/A;   CARDIOVERSION N/A 09/27/2013   Procedure: CARDIOVERSION;  Surgeon: Lelon Perla, MD;  Location: [facility] ENDOSCOPY;  Service: Cardiovascular;  Laterality: N/A;   IR ANGIO EXTERNAL CAROTID SEL EXT CAROTID UNI R MOD SED  09/26/2020   IR ANGIO INTRA EXTRACRAN SEL COM CAROTID INNOMINATE BILAT MOD SED  09/26/2020   IR ANGIO VERTEBRAL SEL VERTEBRAL UNI R MOD SED  09/26/2020   IR IVC FILTER PLMT / S&I /IMG GUID/MOD SED  10/08/2020   IR IVC FILTER RETRIEVAL / S&I /IMG GUID/MOD SED  09/28/2021   IR IVUS EACH ADDITIONAL NON CORONARY VESSEL  11/02/2020   IR PTA VENOUS EXCEPT DIALYSIS CIRCUIT  11/02/2020   IR RADIOLOGIST  EVAL & MGMT  01/17/2021   IR RADIOLOGIST EVAL & MGMT  09/12/2021   IR THROMBECT VENO MECH MOD SED  10/27/2020   IR THROMBECT VENO MECH MOD SED  11/02/2020   IR US GUIDE VASC ACCESS LEFT  10/27/2020   IR US GUIDE VASC ACCESS RIGHT  09/26/2020   IR US GUIDE VASC ACCESS RIGHT  11/02/2020   IR VENO/EXT/UNI RIGHT  11/02/2020   IR VENOCAVAGRAM IVC  10/27/2020   LEFT ATRIAL APPENDAGE OCCLUSION N/A 02/28/2022   Procedure: LEFT ATRIAL APPENDAGE OCCLUSION;  Surgeon: [name], MD;  Location: [place] CV LAB;  Service: Cardiovascular;  Laterality: N/A;   LEFT ATRIAL APPENDAGE OCCLUSION N/A 07/11/2022   Procedure: LEFT ATRIAL APPENDAGE OCCLUSION;  Surgeon: [name], MD;  Location: [place] CV LAB;  Service: Cardiovascular;  Laterality: N/A;   RETINAL DETACHMENT SURGERY     right knee arthroscopy     TEE WITHOUT CARDIOVERSION N/A 09/27/2013   Procedure: TRANSESOPHAGEAL ECHOCARDIOGRAM (TEE);  Surgeon: Lelon Perla, MD;  Location: [facility] ENDOSCOPY;  Service: Cardiovascular;  Laterality: N/A;   TEE WITHOUT CARDIOVERSION N/A 02/28/2022   Procedure: TRANSESOPHAGEAL ECHOCARDIOGRAM (TEE);  Surgeon: [name], MD;  Location: [place] CV LAB;  Service: Cardiovascular;  Laterality: N/A;   TEE WITHOUT CARDIOVERSION N/A 07/11/2022   Procedure: TRANSESOPHAGEAL ECHOCARDIOGRAM (TEE);  Surgeon: Quentin Ore,  Hilton Cork, MD;  Location: [place] CV LAB;  Service: Cardiovascular;  Laterality: N/A;   WRIST SURGERY      Family History: Family History  Problem Relation Age of Onset   Heart disease Mother    Heart disease Father    Healthy Sister    Healthy Brother     Social History:   reports that he has never smoked. He has never used smokeless tobacco. He reports current alcohol use. He reports that he does not use drugs.  Medications: (Not in a hospital admission)   No results found for this or any previous visit (from the past 48 hour(s)).  No results found.    Blood pressure 136/81,  pulse 86, temperature 98.1 F (36.7 C), temperature source Axillary, resp. rate 19, height 6' (1.829 m), weight 99.8 kg, SpO2 95 %.  General appearance: alert, cooperative, and appears stated age Head: Normocephalic, without obvious abnormality Neck: supple, symmetrical, trachea midline Extremities: Intact sensation and capillary refill all digits.  +epl/fpl/io.  No wounds.  Splinted Skin: Skin color, texture, turgor normal. No rashes or lesions Neurologic: Grossly normal Incision/Wound: none  Assessment/Plan Left distal radial shaft fracture with dorsal angulation and ulnar styloid fracture.  Also has degenerative changes to carpus.  Discussed non operative and operative treatment options.  Will see in office this week for further treatment planning.  They are comfortable with the plan of care.  Leanora Cover 09/08/2022, 12:31 AM

## 2022-09-08 NOTE — Progress Notes (Signed)
Orthopedic Tech Progress Note Patient Details:  Walter [name] 04-22-1956 [id number]  Patient ID: Walter [name], male   DOB: 1956-01-01, 67 y.o.   MRN: [id number] I attended trauma page. Karolee Stamps 09/08/2022, 12:09 AM

## 2022-09-10 ENCOUNTER — Other Ambulatory Visit: Payer: Self-pay | Admitting: Orthopedic Surgery

## 2022-09-10 ENCOUNTER — Telehealth: Payer: Self-pay | Admitting: Internal Medicine

## 2022-09-10 NOTE — Telephone Encounter (Signed)
   Pre-operative Risk Assessment    Patient Name: Walter [name]  DOB: [birth year] MRN: [id number]      Request for Surgical Clearance    Procedure:  Open reduction  internal fixation left radius fracture  Date of Surgery:  Clearance 09-17-22                                 Surgeon:  Dr Leanora Cover Surgeon's Group or Practice Name:   Phone number:  [phone] Fax number:  [phone]   Type of Clearance Requested:   - Medical  - Pharmacy:  Hold Clopidogrel (Plavix) and any other medicine   Type of Anesthesia:   Choice   Additional requests/questions:    [name]   09/10/2022, 1:30 PM

## 2022-09-10 NOTE — Telephone Encounter (Signed)
Hello Dr. Quentin Ore,  We received a surgical clearance request for [name] to have a open reduction internal fixation of his left wrist he suffered due to a fall.  They are requesting if his Plavix can be held for the procedure.  I see that he recently was switched to Plavix from Eliquis on 2/26 and I remember that you usually do not like to interrupt therapy past to 45 days.  Could you please offer guidance on holding his Eliquis holding his Plavix for his upcoming procedure.  Thank you very much for your time and looking forward to your recommendation.  Ambrose Pancoast, NP

## 2022-09-12 ENCOUNTER — Telehealth (HOSPITAL_COMMUNITY): Payer: Self-pay | Admitting: Emergency Medicine

## 2022-09-12 NOTE — Telephone Encounter (Signed)
Attempted to call patient regarding upcoming cardiac CT appointment. °Left message on voicemail with name and callback number °Beyounce Dickens RN Navigator Cardiac Imaging °[facility] °[phone] Office °[phone] Cell ° °

## 2022-09-13 ENCOUNTER — Encounter: Payer: Self-pay | Admitting: Internal Medicine

## 2022-09-13 ENCOUNTER — Ambulatory Visit (HOSPITAL_COMMUNITY)
Admission: RE | Admit: 2022-09-13 | Discharge: 2022-09-13 | Disposition: A | Discharge status: Home / Self Care | Payer: Medicare Other | Source: Ambulatory Visit | Attending: Cardiology | Admitting: Cardiology

## 2022-09-13 DIAGNOSIS — I480 Paroxysmal atrial fibrillation: Secondary | ICD-10-CM | POA: Diagnosis present

## 2022-09-13 DIAGNOSIS — Z95818 Presence of other cardiac implants and grafts: Secondary | ICD-10-CM | POA: Diagnosis present

## 2022-09-13 MED ORDER — IOHEXOL 350 MG/ML SOLN
95.0000 mL | Freq: Once | INTRAVENOUS | Status: AC | PRN
  Administered 2022-09-13: 95 mL via INTRAVENOUS

## 2022-09-13 NOTE — Progress Notes (Signed)
Pt s/p CT this AM with subsequent extravasation of saline/contrast (approx 50 mL) into right antecubital venous acess site.  Area currently soft but edematous, NT, intact distal pulses and neurologically intact.  Recommend cool compress (can alternate with warm compresses as needed) to site and arm elevation as much as possible over next 24 hrs to promote adequate venous drainage.  Mark margins of affected area with pen. RN made aware.  Will follow-up tomorrow.    Brynda Greathouse, MS RD PA-C 12:59 PM

## 2022-09-13 NOTE — Telephone Encounter (Signed)
I will send FYI to requesting office see notes.

## 2022-09-13 NOTE — Telephone Encounter (Signed)
His LAAO closure was 07/11/22 and he has since been transitioned from Eliquis to Plavix. Looks like his post closure CT was today and has not been read. We are looking to make sure the device is nice and healed with no leak or device thrombus. I would say that if the surgery needs to be performed, that we should at least wait until the CT confirms no leak or clot. If they will not perform the procedure with Plavix, wonder if they could flip to ASA in the interm?

## 2022-09-13 NOTE — Telephone Encounter (Signed)
   Patient Name: Walter [name]  DOB: [birth year] MRN: [id number]  Primary Cardiologist: None  Chart reviewed as part of pre-operative protocol coverage.   Regarding request to hold Plavix prior to wrist surgery, per Kathyrn Drown, NP, who last saw the pt in the office, "His LAAO closure was 07/11/22 and he has since been transitioned from Eliquis to Plavix. Looks like his post closure CT was today and has not been read. We are looking to make sure the device is nice and healed with no leak or device thrombus. I would say that if the surgery needs to be performed, that we should at least wait until the CT confirms no leak or clot. If they will not perform the procedure with Plavix, wonder if they could flip to ASA in the interm?"  I spoke with [name] of the [facility] and relayed the above message as well as need for uninterrupted antiplatelet therapy until CT results have been read and/or Dr. Quentin Ore, or Dr. Lovena Le are able to provide recommendations for Plavix hold.  Pt is unable to complete 4 METS at baseline due to history of stroke.  Discussed with Dr. Curt Bears, DOD, who states patient okay to proceed with surgery without further cardiovascular testing. He may proceed with surgery on Tuesday, 09/17/2022 if Plavix can be continued throughout the perioperative period, to be determined by Dr. Fredna Dow.   The patient and patient's wife (DPR on file) were advised of this recommendation and were notified that if he develops new symptoms prior to surgery to contact our office to arrange for a follow-up visit, and they verbalized understanding.  I will route this recommendation to the requesting party via Epic fax function and remove from pre-op pool.  Please call with questions.  Lenna Sciara, NP 09/13/2022, 5:13 PM

## 2022-09-13 NOTE — Telephone Encounter (Signed)
Will update requesting office

## 2022-09-13 NOTE — Telephone Encounter (Signed)
Office is calling back for update. Patient surgery is 3/19. Please advise

## 2022-09-13 NOTE — Telephone Encounter (Signed)
I will forward this pre op APP to to review if the pt has been cleared.

## 2022-09-16 ENCOUNTER — Other Ambulatory Visit: Payer: Self-pay

## 2022-09-16 ENCOUNTER — Encounter (HOSPITAL_COMMUNITY): Payer: Self-pay | Admitting: Orthopedic Surgery

## 2022-09-16 ENCOUNTER — Telehealth: Payer: Self-pay

## 2022-09-16 NOTE — Telephone Encounter (Signed)
Results viewed by patient yesterday. Medication list updated (Eliquis removed).

## 2022-09-16 NOTE — Pre-Procedure Instructions (Signed)
SDW call completed with patient's wife/POA Cardiologist - Dr.Greg Tayor   PPM/ICD - denies Device Orders - n/a Rep Notified - n/a  EKG - 08/06/22 Stress Test - 10/08/13 ECHO - 07/11/22 Cardiac Cath - denies  Sleep Study/CPAP -denies  Diabetic-denies   Blood Thinner Instructions:patient is on Plavix-pt's wife was told By Hassan Rowan at [facility] office to stop Plavix today. Aspirin Instructions:denies  ERAS Protcol - yes  COVID TEST- n/a   Anesthesia review: YES, Cardiac hx. Patient has a Forensic scientist.    Patient verbally denies any shortness of breath, fever, cough and chest pain during phone call.     -------------  SDW INSTRUCTIONS given to wife:   Your procedure is scheduled on 09/19/22.             Report to [facility] Main Entrance "A" at  10:00  A.M., and check in at the Admitting office.             Call this number if you have problems the morning of surgery:             [phone]               Remember:             Do not eat or drink after midnight the night before your surgery                          Take these medicines the morning of surgery with A SIP OF WATER none  As of today, STOP taking any Aspirin (unless otherwise instructed by your surgeon) Aleve, Naproxen, Ibuprofen, Motrin, Advil, Goody's, BC's, all herbal medications, fish oil, and all vitamins.                        Do not wear jewelry, make up, or nail polish            Do not wear lotions, powders, colognes, or deodorant.            Do not shave 48 hours prior to surgery.  Men may shave face and neck.            Do not bring valuables to the hospital.            [facility] is not responsible for any belongings or valuables.   Do NOT Smoke (Tobacco/Vaping) 24 hours prior to your procedure If you use a CPAP at night, you may bring all equipment for your overnight stay.   Contacts, glasses, dentures or partials may not be worn into surgery.      For patients admitted to the hospital, discharge  time will be determined by your treatment team.   Patients discharged the day of surgery will not be allowed to drive home, and someone needs to stay with them for 24 hours.     Special instructions:   [place]- Preparing For Surgery  Oral Hygiene is also important to reduce your risk of infection.  Remember - BRUSH YOUR TEETH THE MORNING OF SURGERY WITH YOUR REGULAR TOOTHPASTE   Before surgery, you can play an important role. Because skin is not sterile, your skin needs to be as free of germs as possible. You can reduce the number of germs on your skin by washing with Dial (or any antibacterial) Soap before surgery.    Please do not use if you have an allergy to antibacterial soaps. If  your skin becomes reddened/irritated stop using the Antibacterial Soap  Do not shave (including legs and underarms) for at least 48 hours prior to surgery. It is OK to shave your face.   Please follow these instructions carefully.              Shower the NIGHT BEFORE SURGERY and the MORNING OF SURGERY with (DIAL) Antibacterial Soap. Wash your body and hair with your normal shampoo/soap then rinse. Using a clean wash cloth wash from your neck down using the antibacterial soap. Wash thoroughly, paying special attention to the area where your surgery will be performed. Thoroughly rinse your body with warm water from the neck down.   Pat yourself dry with a CLEAN TOWEL.   Wear CLEAN PAJAMAS to bed the night before surgery.   Place CLEAN SHEETS on your bed the night of your surgery and DO NOT SLEEP WITH PETS.     Day of Surgery: Please shower morning of surgery using antibacterial soap Wear Clean/Comfortable clothing the morning of surgery Do not apply any deodorants/lotions.   Remember to brush your teeth WITH YOUR REGULAR TOOTHPASTE.   Questions were answered. Patient verbalized understanding of instructions.

## 2022-09-16 NOTE — Telephone Encounter (Signed)
   Name: Walter [name]  DOB: 11/23/1955  MRN: [id number]   Primary Cardiologist: None  Chart reviewed as part of pre-operative protocol coverage.   Per note from Diona Browner, NP dated 09/13/2022:  Regarding request to hold Plavix prior to wrist surgery, per Kathyrn Drown, NP, who last saw the pt in the office, "His LAAO closure was 07/11/22 and he has since been transitioned from Eliquis to Plavix. Looks like his post closure CT was today and has not been read. We are looking to make sure the device is nice and healed with no leak or device thrombus. I would say that if the surgery needs to be performed, that we should at least wait until the CT confirms no leak or clot. If they will not perform the procedure with Plavix, wonder if they could flip to ASA in the interm?"   "Pt is unable to complete 4 METS at baseline due to history of stroke.  Discussed with Dr. Curt Bears, DOD, who states patient okay to proceed with surgery without further cardiovascular testing. He may proceed with surgery on Tuesday, 09/17/2022 if Plavix can be continued throughout the perioperative period, to be determined by Dr. Fredna Dow.   The patient and patient's wife (DPR on file) were advised of this recommendation and were notified that if he develops new symptoms prior to surgery to contact our office to arrange for a follow-up visit."  Per Dr. Quentin Ore:  "CT shows the device is well positioned without clot and is fully endothelialized.  OK to stay off anticoagulant.  For the wrist surgery, if they need to interrupt, I think it is reasonable to hold the Plavix for 4-5 days and restart when felt safe from a post op perspective. If they are going to hold the Plavix and can do it on Aspirin, that would be ideal."  Therefore, based on ACC/AHA guidelines, the patient would be at acceptable risk for the planned procedure without further cardiovascular testing.   I will route this recommendation to the requesting party via Epic fax  function and remove from pre-op pool. Please call with questions.  Mayra Reel, NP 09/16/2022, 9:27 AM

## 2022-09-16 NOTE — Anesthesia Preprocedure Evaluation (Signed)
Anesthesia Evaluation  Patient identified by MRN, date of birth, ID band Patient awake    Reviewed: Allergy & Precautions, NPO status , Patient's Chart, lab work & pertinent test results  History of Anesthesia Complications Negative for: history of anesthetic complications  Airway Mallampati: III  TM Distance: >3 FB Neck ROM: Full    Dental  (+) Dental Advisory Given   Pulmonary neg pulmonary ROS   breath sounds clear to auscultation       Cardiovascular  Rhythm:Regular   1. Interventional TEE for LAA-O placement. Prior to procedure, patent  left atrial appendage. Maximal dimension 2.5 cm suitable for a 31 mm FLX  device. Prior to transeptal puncture, the filamentous lesions seen in  prior TEE was again visualized. Contrast   imaging was performed, showing no evidence of thrombus. Succesful  transeptal puncture. Placement of a 31 mm with small mitral shoulder small  anterior gutter but with no peri-device leak and compression of ~ 26%.  Post procedure there was a left to right  interatrial shunt and a trivial pericardial effusion, unchanged from  prior.. No left atrial/left atrial appendage thrombus was detected.   2. Left ventricular ejection fraction, by estimation, is 60 to 65%. The  left ventricle has normal function.   3. Right ventricular systolic function is normal. The right ventricular  size is normal.   4. The mitral valve is abnormal. Trivial mitral valve regurgitation. No  evidence of mitral stenosis.   5. The aortic valve is tricuspid. Aortic valve regurgitation is not  visualized.   6. Evidence of atrial level shunting detected by color flow Doppler.     Neuro/Psych ataxia CVA, Residual Symptoms  negative psych ROS   GI/Hepatic negative GI ROS, Neg liver ROS,,,  Endo/Other  negative endocrine ROS    Renal/GU negative Renal ROSLab Results      Component                Value               Date                       CREATININE               0.90                09/19/2022                Musculoskeletal   Abdominal   Peds  Hematology Plavix held 3 days  Lab Results      Component                Value               Date                      WBC                      5.6                 09/19/2022                HGB                      15.1                09/19/2022  HCT                      46.1                09/19/2022                MCV                      94.1                09/19/2022                PLT                      232                 09/19/2022              Anesthesia Other Findings   Reproductive/Obstetrics                             Anesthesia Physical Anesthesia Plan  ASA: 3  Anesthesia Plan: MAC and Regional   Post-op Pain Management: Regional block*   Induction: Intravenous  PONV Risk Score and Plan: 1 and Ondansetron, Propofol infusion and Treatment may vary due to age or medical condition  Airway Management Planned: Nasal Cannula and Natural Airway  Additional Equipment: None  Intra-op Plan:   Post-operative Plan:   Informed Consent: I have reviewed the patients History and Physical, chart, labs and discussed the procedure including the risks, benefits and alternatives for the proposed anesthesia with the patient or authorized representative who has indicated his/her understanding and acceptance.     Dental advisory given  Plan Discussed with: CRNA  Anesthesia Plan Comments: (PAT note written 09/16/2022 by Shonna Chock, PA-C.  )       Anesthesia Quick Evaluation

## 2022-09-16 NOTE — Progress Notes (Signed)
Anesthesia Chart Review: Walter [name]  Case: [id number] Date/Time: 09/19/22 1215   Procedure: OPEN REDUCTION INTERNAL FIXATION (ORIF) LEFT RADIUS FRACTURE (Left) - 75 MIN   Anesthesia type: Choice   Pre-op diagnosis: LEFT RADIUS FRACTURE   Location: [place] OR ROOM 04 / [place] OR   Surgeons: Leanora Cover, MD       DISCUSSION: Patient is a 68 year old male scheduled for the above procedure. On 09/08/22, he fell while getting out of the car and sustained a left radius fracture.   History includes never smoker, atrial fib/flutter (s/p SVT ablation 03/09/20; s/p Watchman device 07/11/22), CVA, DVT (acute RLE DVT, age-indeterminate LLE DVT 10/08/20 [place]), hemorrhagic CVA (09/25/20), IVC filter (10/08/20-09/28/21), retinal detachment (s/p surgery).  Preoperative cardiology input outlined by Mayra Reel, NP on 09/16/22:  "Per note from Diona Browner, NP dated 09/13/2022:  Regarding request to hold Plavix prior to wrist surgery, per Kathyrn Drown, NP, who last saw the pt in the office, "His LAAO closure was 07/11/22 and he has since been transitioned from Eliquis to Plavix. Looks like his post closure CT was today and has not been read. We are looking to make sure the device is nice and healed with no leak or device thrombus. I would say that if the surgery needs to be performed, that we should at least wait until the CT confirms no leak or clot. If they will not perform the procedure with Plavix, wonder if they could flip to ASA in the interm?"    "Pt is unable to complete 4 METS at baseline due to history of stroke.  Discussed with Dr. Curt Bears, DOD, who states patient okay to proceed with surgery without further cardiovascular testing. He may proceed with surgery on Tuesday, 09/17/2022 if Plavix can be continued throughout the perioperative period, to be determined by Dr. Fredna Dow.   The patient and patient's wife (DPR on file) were advised of this recommendation and were notified that if he develops new symptoms prior  to surgery to contact our office to arrange for a follow-up visit."   Per Dr. Quentin Ore:  "CT shows the device is well positioned without clot and is fully endothelialized.  OK to stay off anticoagulant.  For the wrist surgery, if they need to interrupt, I think it is reasonable to hold the Plavix for 4-5 days and restart when felt safe from a post op perspective. If they are going to hold the Plavix and can do it on Aspirin, that would be ideal."   Therefore, based on ACC/AHA guidelines, the patient would be at acceptable risk for the planned procedure without further cardiovascular testing." He reported instructions to hold Plavix starting 09/16/22.   He is a same-day workup, so labs as indicated and anesthesia team evaluation on the day of surgery.   VS:  BP Readings from Last 3 Encounters:  09/13/22 90/68  09/08/22 121/65  08/19/22 118/70   Pulse Readings from Last 3 Encounters:  09/08/22 80  08/19/22 71  08/06/22 69     PROVIDERS: Alroy Dust, L.Marlou Sa, MD is PCP  Cristopher Peru, MD & Lars Mage, MD are EP cardiologists Antony Contras, MD is neurologist   LABS: Most recent labs in [facility] include: Lab Results  Component Value Date   WBC 5.3 06/28/2022   HGB 15.7 06/28/2022   HCT 47.5 06/28/2022   PLT 223 06/28/2022   GLUCOSE 110 (H) 08/19/2022   NA 139 08/19/2022   K 4.1 08/19/2022   CL 103 08/19/2022   CREATININE 0.88 08/19/2022  BUN 12 08/19/2022   CO2 22 08/19/2022    IMAGES: CT Left Wrist 09/08/22: MPRESSION: 1. Redemonstrated transverse fracture of the distal radial diaphysis with 5 mm lateral displacement of the distal radial fragment. 2. Comminuted fracture of the distal ulnar metaphysis. 3. Cortical irregularity about the articular surface of the distal radius with subchondral cystic change and sclerosis. This is favored related to advanced degenerative arthritis though acute impacted fracture is not excluded.   CT Head 09/07/22: IMPRESSION: No acute  intracranial abnormality.  CXR 07/11/22: FINDINGS: - Cardiac silhouette is normal in size and configuration. Normal mediastinal and hilar contours. - Clear lungs.  No pleural effusion or pneumothorax. - Skeletal structures are intact. IMPRESSION: No active cardiopulmonary disease.   EKG: 08/06/22: NSR. LAD. Possible anterior infarct, age undetermined.   CV: CT Cardiac (Post-LAAO device) w/ Calcium Score 09/13/22: Coronary Calcium Score: Left main: 0 Left anterior descending artery: 0 Left circumflex artery: 0 Right coronary artery: 0 Total: 0 IMPRESSION: 1.  Successful left atrial appendage exclusion. 2. There is a small filamentous lesion described above. 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    TEE (post Watchman) 07/11/22: IMPRESSIONS   1. Interventional TEE for LAA-O placement. Prior to procedure, patent  left atrial appendage. Maximal dimension 2.5 cm suitable for a 31 mm FLX  device. Prior to transeptal puncture, the filamentous lesions seen in  prior TEE was again visualized. Contrast   imaging was performed, showing no evidence of thrombus. Succesful  transeptal puncture. Placement of a 31 mm with small mitral shoulder small  anterior gutter but with no peri-device leak and compression of ~ 26%.  Post procedure there was a left to right  interatrial shunt and a trivial pericardial effusion, unchanged from  prior.. No left atrial/left atrial appendage thrombus was detected.   2. Left ventricular ejection fraction, by estimation, is 60 to 65%. The  left ventricle has normal function.   3. Right ventricular systolic function is normal. The right ventricular  size is normal.   4. The mitral valve is abnormal. Trivial mitral valve regurgitation. No  evidence of mitral stenosis.   5. The aortic valve is tricuspid. Aortic valve regurgitation is not  visualized.   6. Evidence of  atrial level shunting detected by color flow Doppler.  - Comparison(s): No change from prior.    Zio XT monitor 07/20/20 - 08/03/20: The predominant rhythm is sinus rhythm  The patient had a min HR of 50 bpm, max HR of 152 bpm, and avg HR of 70 bpm.    Atrial fibrillation is observed (1% burden) with longest episode of 43 minutes and 45 seconds.   Heart rates during afib range from 62-144 bpm (avg of 95 bpm).   Occasional premature atrial contractions and premature ventricular contractions are observed Episodes of atrial tachycardia vs atypical atrial flutter are also noted.   Past Medical History:  Diagnosis Date   Atrial flutter [facility])    s/p ablation   DVT (deep venous thrombosis) ([place])    Near syncope 09/25/2013   Paroxysmal atrial fibrillation (HCC)    Presence of Watchman left atrial appendage closure device 07/11/2022   s/p LAAO with Watchman with a 31 mm Watchman Flex device by Dr. Quentin Ore.   Stroke ([place]) 09/25/2020   initially hemorragic (not on [place]) followed by subsequent embolic stroke    Past Surgical History:  Procedure Laterality Date   A-FLUTTER ABLATION N/A  03/09/2020   Procedure: A-FLUTTER ABLATION;  Surgeon: Thompson Grayer, MD;  Location: [place] CV LAB;  Service: Cardiovascular;  Laterality: N/A;   CARDIOVERSION N/A 09/27/2013   Procedure: CARDIOVERSION;  Surgeon: Lelon Perla, MD;  Location: [facility] ENDOSCOPY;  Service: Cardiovascular;  Laterality: N/A;   IR ANGIO EXTERNAL CAROTID SEL EXT CAROTID UNI R MOD SED  09/26/2020   IR ANGIO INTRA EXTRACRAN SEL COM CAROTID INNOMINATE BILAT MOD SED  09/26/2020   IR ANGIO VERTEBRAL SEL VERTEBRAL UNI R MOD SED  09/26/2020   IR IVC FILTER PLMT / S&I /IMG GUID/MOD SED  10/08/2020   IR IVC FILTER RETRIEVAL / S&I /IMG GUID/MOD SED  09/28/2021   IR IVUS EACH ADDITIONAL NON CORONARY VESSEL  11/02/2020   IR PTA VENOUS EXCEPT DIALYSIS CIRCUIT  11/02/2020   IR RADIOLOGIST EVAL & MGMT  01/17/2021   IR RADIOLOGIST EVAL & MGMT  09/12/2021   IR  THROMBECT VENO MECH MOD SED  10/27/2020   IR THROMBECT VENO MECH MOD SED  11/02/2020   IR US GUIDE VASC ACCESS LEFT  10/27/2020   IR US GUIDE VASC ACCESS RIGHT  09/26/2020   IR US GUIDE VASC ACCESS RIGHT  11/02/2020   IR VENO/EXT/UNI RIGHT  11/02/2020   IR VENOCAVAGRAM IVC  10/27/2020   LEFT ATRIAL APPENDAGE OCCLUSION N/A 02/28/2022   Procedure: LEFT ATRIAL APPENDAGE OCCLUSION;  Surgeon: [name], MD;  Location: [place] CV LAB;  Service: Cardiovascular;  Laterality: N/A;   LEFT ATRIAL APPENDAGE OCCLUSION N/A 07/11/2022   Procedure: LEFT ATRIAL APPENDAGE OCCLUSION;  Surgeon: [name], MD;  Location: [place] [facility] LAB;  Service: Cardiovascular;  Laterality: N/A;   RETINAL DETACHMENT SURGERY     right knee arthroscopy     TEE WITHOUT CARDIOVERSION N/A 09/27/2013   Procedure: TRANSESOPHAGEAL ECHOCARDIOGRAM (TEE);  Surgeon: Lelon Perla, MD;  Location: [facility] ENDOSCOPY;  Service: Cardiovascular;  Laterality: N/A;   TEE WITHOUT CARDIOVERSION N/A 02/28/2022   Procedure: TRANSESOPHAGEAL ECHOCARDIOGRAM (TEE);  Surgeon: [name], MD;  Location: [place] CV LAB;  Service: Cardiovascular;  Laterality: N/A;   TEE WITHOUT CARDIOVERSION N/A 07/11/2022   Procedure: TRANSESOPHAGEAL ECHOCARDIOGRAM (TEE);  Surgeon: [name], MD;  Location: [place] CV LAB;  Service: Cardiovascular;  Laterality: N/A;   WRIST SURGERY      MEDICATIONS: No current facility-administered medications for this encounter.    atorvastatin (LIPITOR) 20 MG tablet   clopidogrel (PLAVIX) 75 MG tablet   DILT-XR 180 MG 24 hr capsule   EPINEPHrine 0.3 mg/0.3 mL IJ SOAJ injection   mirtazapine (REMERON) 7.5 MG tablet   Probiotic Product (PROBIOTIC PO)   sertraline (ZOLOFT) 100 MG tablet   VENTOLIN HFA 108 (90 Base) MCG/ACT inhaler   oxyCODONE-acetaminophen (PERCOCET/ROXICET) 5-325 MG tablet    Myra Gianotti, PA-C Surgical Short Stay/Anesthesiology [facility] Phone [phone] [facility] Phone [phone] 09/16/2022 3:18 PM

## 2022-09-16 NOTE — Telephone Encounter (Signed)
-----   Message from [name], MD sent at 09/14/2022 10:45 PM EDT ----- Watchman device in great position. Appendage sealed without leak. Continue with planned post implant medication regimen.  Lysbeth Galas T. Quentin Ore, MD, [facility], [facility] Cardiac Electrophysiology

## 2022-09-19 ENCOUNTER — Encounter (HOSPITAL_COMMUNITY)
Admission: RE | Disposition: A | Discharge status: Home / Self Care | Payer: Self-pay | Source: Home / Self Care | Attending: Orthopedic Surgery

## 2022-09-19 ENCOUNTER — Ambulatory Visit (HOSPITAL_COMMUNITY): Payer: Medicare Other

## 2022-09-19 ENCOUNTER — Ambulatory Visit (HOSPITAL_COMMUNITY): Payer: Medicare Other | Admitting: Vascular Surgery

## 2022-09-19 ENCOUNTER — Other Ambulatory Visit: Payer: Self-pay

## 2022-09-19 ENCOUNTER — Ambulatory Visit (HOSPITAL_COMMUNITY)
Admission: RE | Admit: 2022-09-19 | Discharge: 2022-09-19 | Disposition: A | Discharge status: Home / Self Care | Payer: Medicare Other | Attending: Orthopedic Surgery | Admitting: Orthopedic Surgery

## 2022-09-19 ENCOUNTER — Ambulatory Visit (HOSPITAL_BASED_OUTPATIENT_CLINIC_OR_DEPARTMENT_OTHER): Payer: Medicare Other | Admitting: Vascular Surgery

## 2022-09-19 ENCOUNTER — Encounter (HOSPITAL_COMMUNITY): Payer: Self-pay | Admitting: Orthopedic Surgery

## 2022-09-19 DIAGNOSIS — I340 Nonrheumatic mitral (valve) insufficiency: Secondary | ICD-10-CM | POA: Diagnosis not present

## 2022-09-19 DIAGNOSIS — Z86718 Personal history of other venous thrombosis and embolism: Secondary | ICD-10-CM | POA: Diagnosis not present

## 2022-09-19 DIAGNOSIS — S52502D Unspecified fracture of the lower end of left radius, subsequent encounter for closed fracture with routine healing: Secondary | ICD-10-CM | POA: Diagnosis not present

## 2022-09-19 DIAGNOSIS — I480 Paroxysmal atrial fibrillation: Secondary | ICD-10-CM | POA: Insufficient documentation

## 2022-09-19 DIAGNOSIS — W19XXXA Unspecified fall, initial encounter: Secondary | ICD-10-CM | POA: Insufficient documentation

## 2022-09-19 DIAGNOSIS — I3139 Other pericardial effusion (noninflammatory): Secondary | ICD-10-CM

## 2022-09-19 DIAGNOSIS — S52392A Other fracture of shaft of radius, left arm, initial encounter for closed fracture: Secondary | ICD-10-CM | POA: Diagnosis not present

## 2022-09-19 DIAGNOSIS — Z8673 Personal history of transient ischemic attack (TIA), and cerebral infarction without residual deficits: Secondary | ICD-10-CM | POA: Insufficient documentation

## 2022-09-19 DIAGNOSIS — Z09 Encounter for follow-up examination after completed treatment for conditions other than malignant neoplasm: Secondary | ICD-10-CM | POA: Insufficient documentation

## 2022-09-19 DIAGNOSIS — I6990 Unspecified sequelae of unspecified cerebrovascular disease: Secondary | ICD-10-CM

## 2022-09-19 HISTORY — PX: OPEN REDUCTION INTERNAL FIXATION (ORIF) DISTAL RADIAL FRACTURE: SHX5989

## 2022-09-19 LAB — CBC
HCT: 46.1 % (ref 39.0–52.0)
Hemoglobin: 15.1 g/dL (ref 13.0–17.0)
MCH: 30.8 pg (ref 26.0–34.0)
MCHC: 32.8 g/dL (ref 30.0–36.0)
MCV: 94.1 fL (ref 80.0–100.0)
Platelets: 232 10*3/uL (ref 150–400)
RBC: 4.9 MIL/uL (ref 4.22–5.81)
RDW: 12.7 % (ref 11.5–15.5)
WBC: 5.6 10*3/uL (ref 4.0–10.5)
nRBC: 0 % (ref 0.0–0.2)

## 2022-09-19 LAB — BASIC METABOLIC PANEL
Anion gap: 9 (ref 5–15)
BUN: 10 mg/dL (ref 8–23)
CO2: 25 mmol/L (ref 22–32)
Calcium: 9.1 mg/dL (ref 8.9–10.3)
Chloride: 104 mmol/L (ref 98–111)
Creatinine, Ser: 0.9 mg/dL (ref 0.61–1.24)
GFR, Estimated: >60 mL/min (ref >60)
Glucose, Bld: 114 mg/dL — ABNORMAL HIGH (ref 70–99)
Potassium: 4.3 mmol/L (ref 3.5–5.1)
Sodium: 138 mmol/L (ref 135–145)

## 2022-09-19 SURGERY — OPEN REDUCTION INTERNAL FIXATION (ORIF) DISTAL RADIUS FRACTURE
Anesthesia: Monitor Anesthesia Care | Laterality: Left

## 2022-09-19 MED ORDER — FENTANYL CITRATE (PF) 250 MCG/5ML IJ SOLN
INTRAMUSCULAR | Status: AC
  Filled 2022-09-19: qty 5

## 2022-09-19 MED ORDER — CEFAZOLIN SODIUM-DEXTROSE 2-4 GM/100ML-% IV SOLN
2.0000 g | INTRAVENOUS | Status: AC
  Administered 2022-09-19: 2 g via INTRAVENOUS
  Filled 2022-09-19: qty 100

## 2022-09-19 MED ORDER — BUPIVACAINE HCL (PF) 0.25 % IJ SOLN
INTRAMUSCULAR | Status: AC
  Filled 2022-09-19: qty 30

## 2022-09-19 MED ORDER — LACTATED RINGERS IV SOLN: INTRAVENOUS | Status: DC

## 2022-09-19 MED ORDER — CHLORHEXIDINE GLUCONATE 0.12 % MT SOLN: 15.0000 mL | Freq: Once | OROMUCOSAL | Status: AC

## 2022-09-19 MED ORDER — PROPOFOL 500 MG/50ML IV EMUL
INTRAVENOUS | Status: DC | PRN
  Administered 2022-09-19: 50 ug/kg/min via INTRAVENOUS

## 2022-09-19 MED ORDER — ONDANSETRON HCL 4 MG/2ML IJ SOLN
INTRAMUSCULAR | Status: DC | PRN
  Administered 2022-09-19: 4 mg via INTRAVENOUS

## 2022-09-19 MED ORDER — FENTANYL CITRATE (PF) 100 MCG/2ML IJ SOLN
INTRAMUSCULAR | Status: AC
  Administered 2022-09-19: 50 ug
  Filled 2022-09-19: qty 2

## 2022-09-19 MED ORDER — FENTANYL CITRATE (PF) 100 MCG/2ML IJ SOLN: INTRAMUSCULAR | Status: DC | PRN

## 2022-09-19 MED ORDER — HYDROCODONE-ACETAMINOPHEN 5-325 MG PO TABS: ORAL_TABLET | ORAL | 0 refills | Status: DC

## 2022-09-19 MED ORDER — ACETAMINOPHEN 500 MG PO TABS: 1000.0000 mg | ORAL_TABLET | Freq: Once | ORAL | Status: DC | PRN

## 2022-09-19 MED ORDER — 0.9 % SODIUM CHLORIDE (POUR BTL) OPTIME
TOPICAL | Status: DC | PRN
  Administered 2022-09-19: 1000 mL

## 2022-09-19 MED ORDER — ORAL CARE MOUTH RINSE
15.0000 mL | Freq: Once | OROMUCOSAL | Status: AC
  Administered 2022-09-19: 15 mL via OROMUCOSAL

## 2022-09-19 MED ORDER — ACETAMINOPHEN 10 MG/ML IV SOLN: 1000.0000 mg | Freq: Once | INTRAVENOUS | Status: DC | PRN

## 2022-09-19 MED ORDER — PROPOFOL 10 MG/ML IV BOLUS
INTRAVENOUS | Status: DC | PRN
  Administered 2022-09-19: 50 mg via INTRAVENOUS
  Administered 2022-09-19: 75 mg via INTRAVENOUS

## 2022-09-19 MED ORDER — CHLORHEXIDINE GLUCONATE 0.12 % MT SOLN
OROMUCOSAL | Status: AC
  Filled 2022-09-19: qty 15

## 2022-09-19 MED ORDER — LIDOCAINE-EPINEPHRINE (PF) 1.5 %-1:200000 IJ SOLN
INTRAMUSCULAR | Status: DC | PRN
  Administered 2022-09-19: 10 mL via PERINEURAL

## 2022-09-19 MED ORDER — MIDAZOLAM HCL 2 MG/2ML IJ SOLN
INTRAMUSCULAR | Status: AC
  Filled 2022-09-19: qty 2

## 2022-09-19 MED ORDER — ACETAMINOPHEN 160 MG/5ML PO SOLN: 1000.0000 mg | Freq: Once | ORAL | Status: DC | PRN

## 2022-09-19 MED ORDER — BUPIVACAINE-EPINEPHRINE (PF) 0.5% -1:200000 IJ SOLN
INTRAMUSCULAR | Status: DC | PRN
  Administered 2022-09-19: 25 mL via PERINEURAL

## 2022-09-19 MED ORDER — FENTANYL CITRATE (PF) 100 MCG/2ML IJ SOLN: 25.0000 ug | INTRAMUSCULAR | Status: DC | PRN

## 2022-09-19 SURGICAL SUPPLY — 65 items
BAG COUNTER SPONGE SURGICOUNT (BAG) ×1 IMPLANT
BAG SPNG CNTER NS LX DISP (BAG) ×1
BLADE CLIPPER SURG (BLADE) IMPLANT
BNDG CMPR 5X3 KNIT ELC UNQ LF (GAUZE/BANDAGES/DRESSINGS) ×1
BNDG CMPR 9X4 STRL LF SNTH (GAUZE/BANDAGES/DRESSINGS) ×1
BNDG ELASTIC 3INX 5YD STR LF (GAUZE/BANDAGES/DRESSINGS) ×1 IMPLANT
BNDG ELASTIC 4X5.8 VLCR STR LF (GAUZE/BANDAGES/DRESSINGS) ×1 IMPLANT
BNDG ESMARK 4X9 LF (GAUZE/BANDAGES/DRESSINGS) ×1 IMPLANT
BNDG GAUZE DERMACEA FLUFF 4 (GAUZE/BANDAGES/DRESSINGS) ×1 IMPLANT
BNDG GZE DERMACEA 4 6PLY (GAUZE/BANDAGES/DRESSINGS) ×1
CORD BIPOLAR FORCEPS 12FT (ELECTRODE) ×1 IMPLANT
COVER SURGICAL LIGHT HANDLE (MISCELLANEOUS) ×1 IMPLANT
CUFF TOURN SGL QUICK 18X4 (TOURNIQUET CUFF) ×1 IMPLANT
CUFF TOURN SGL QUICK 24 (TOURNIQUET CUFF)
CUFF TRNQT CYL 24X4X16.5-23 (TOURNIQUET CUFF) IMPLANT
DRAIN TLS ROUND 10FR (DRAIN) IMPLANT
DRAPE OEC MINIVIEW 54X84 (DRAPES) IMPLANT
DRAPE SURG 17X23 STRL (DRAPES) ×1 IMPLANT
DRSG XEROFORM 1X8 (GAUZE/BANDAGES/DRESSINGS) IMPLANT
GAUZE SPONGE 4X4 12PLY STRL (GAUZE/BANDAGES/DRESSINGS) ×1 IMPLANT
GAUZE XEROFORM 1X8 LF (GAUZE/BANDAGES/DRESSINGS) ×1 IMPLANT
GLOVE BIO SURGEON STRL SZ7.5 (GLOVE) ×1 IMPLANT
GLOVE BIOGEL PI IND STRL 8 (GLOVE) ×1 IMPLANT
GOWN STRL REUS W/ TWL LRG LVL3 (GOWN DISPOSABLE) ×3 IMPLANT
GOWN STRL REUS W/ TWL XL LVL3 (GOWN DISPOSABLE) ×3 IMPLANT
GOWN STRL REUS W/TWL LRG LVL3 (GOWN DISPOSABLE) ×3
GOWN STRL REUS W/TWL XL LVL3 (GOWN DISPOSABLE) ×3
GUIDEWIRE ORTHO 0.054X6 (WIRE) IMPLANT
KIT BASIN OR (CUSTOM PROCEDURE TRAY) ×1 IMPLANT
KIT TURNOVER KIT B (KITS) ×1 IMPLANT
LOOP VASCLR MAXI BLUE 18IN ST (MISCELLANEOUS) IMPLANT
LOOP VASCULAR MAXI 18 BLUE (MISCELLANEOUS) ×1
LOOPS VASCLR MAXI BLUE 18IN ST (MISCELLANEOUS) ×1 IMPLANT
NDL 22X1.5 STRL (OR ONLY) (MISCELLANEOUS) IMPLANT
NEEDLE 22X1.5 STRL (OR ONLY) (MISCELLANEOUS) ×1 IMPLANT
NS IRRIG 1000ML POUR BTL (IV SOLUTION) ×1 IMPLANT
PACK ORTHO EXTREMITY (CUSTOM PROCEDURE TRAY) ×1 IMPLANT
PAD ARMBOARD 7.5X6 YLW CONV (MISCELLANEOUS) ×2 IMPLANT
PAD CAST 4YDX4 CTTN HI CHSV (CAST SUPPLIES) ×1 IMPLANT
PADDING CAST ABS COTTON 4X4 ST (CAST SUPPLIES) IMPLANT
PADDING CAST COTTON 4X4 STRL (CAST SUPPLIES) ×1
PLATE VOLAR RADIS MIDSHAFT 6 H (Plate) IMPLANT
SCREW HEXALOBE NON-LOCK 3.5X14 (Screw) IMPLANT
SCREW HEXALOBE NON-LOCK 3.5X16 (Screw) IMPLANT
SCREW NLCKG 13 3.5X13 HEXA (Screw) IMPLANT
SCREW NON-LOCK 3.5X13 (Screw) ×1 IMPLANT
SCREW NONLOCK HEX 3.5X12 (Screw) IMPLANT
SPLINT PLASTER CAST FAST 3X15 (CAST SUPPLIES) IMPLANT
SPONGE T-LAP 4X18 ~~LOC~~+RFID (SPONGE) IMPLANT
STOCKINETTE 6  STRL (DRAPES) ×1
STOCKINETTE 6 STRL (DRAPES) IMPLANT
SUT ETHILON 4 0 PS 2 18 (SUTURE) IMPLANT
SUT MNCRL AB 4-0 PS2 18 (SUTURE) ×1 IMPLANT
SUT PROLENE 3 0 PS 2 (SUTURE) IMPLANT
SUT VIC AB 3-0 FS2 27 (SUTURE) IMPLANT
SUT VIC AB 4-0 PS2 18 (SUTURE) IMPLANT
SYR CONTROL 10ML LL (SYRINGE) IMPLANT
SYSTEM CHEST DRAIN TLS 7FR (DRAIN) IMPLANT
TOWEL GREEN STERILE (TOWEL DISPOSABLE) ×1 IMPLANT
TOWEL GREEN STERILE FF (TOWEL DISPOSABLE) ×1 IMPLANT
TUBE CONNECTING 12X1/4 (SUCTIONS) ×1 IMPLANT
TUBE EVACUATION TLS (MISCELLANEOUS) ×1 IMPLANT
UNDERPAD 30X36 HEAVY ABSORB (UNDERPADS AND DIAPERS) ×1 IMPLANT
VASCULAR TIE MAXI BLUE 18IN ST (MISCELLANEOUS) ×1
WATER STERILE IRR 1000ML POUR (IV SOLUTION) ×1 IMPLANT

## 2022-09-19 NOTE — Op Note (Signed)
NAME: Walter [name] MEDICAL RECORD NO: [id number] DATE OF BIRTH: April 10, 1956 FACILITY: [facility] LOCATION: [facility] OR PHYSICIAN: Tennis Must, MD   OPERATIVE REPORT   DATE OF PROCEDURE: 09/19/22    PREOPERATIVE DIAGNOSIS: Left distal radial shaft fracture   POSTOPERATIVE DIAGNOSIS: Left distal radial shaft fracture   PROCEDURE: Open reduction internal fixation left distal radial shaft fracture   SURGEON:  Leanora Cover, M.D.   ASSISTANT: Daryll Brod, MD   ANESTHESIA:  Regional with sedation   INTRAVENOUS FLUIDS:  Per anesthesia flow sheet.   ESTIMATED BLOOD LOSS:  Minimal.   COMPLICATIONS:  None.   SPECIMENS:  none   TOURNIQUET TIME:    Total Tourniquet Time Documented: Upper Arm (Left) - 49 minutes Total: Upper Arm (Left) - 49 minutes    DISPOSITION:  Stable to PACU.   INDICATIONS: 67 year old male states he fell getting out of the car approximate 12 days ago injuring his left wrist.  He was seen at the emergency department where radiographs were taken revealing distal radius shaft fracture.  He was splinted and followed up in the office.  He wishes to proceed with operative reduction and fixation.  Risks, benefits and alternatives of surgery were discussed including the risks of blood loss, infection, damage to nerves, vessels, tendons, ligaments, bone for surgery, need for additional surgery, complications with wound healing, continued pain, stiffness, , nonunion, malunion.  He voiced understanding of these risks and elected to proceed.  OPERATIVE COURSE:  After being identified preoperatively by myself,  the patient and I agreed on the procedure and site of the procedure.  The surgical site was marked.  Surgical consent had been signed. Preoperative IV antibiotic prophylaxis was given. He was transferred to the operating room and placed on the operating table in supine position with the Left upper extremity on an arm board.  Sedation was induced by the anesthesiologist. A  regional block had been performed by anesthesia in preoperative holding.    Left upper extremity was prepped and draped in normal sterile orthopedic fashion.  A surgical pause was performed between the surgeons, anesthesia, and operating room staff and all were in agreement as to the patient, procedure, and site of procedure.  Tourniquet at the proximal aspect of the extremity was inflated to 250 mmHg after exsanguination of the arm with an Esmarch bandage.  Incision was made over the FCR tendon and carried into the subcutaneous tissues by spreading technique.  Bipolar electrocautery was used to obtain hemostasis.  The superficial and deep portions of the FCR tendon sheath were sharply incised and the FCR and FPL swept ulnarly to protect the palmar tames branch of the median nerve.  The FPL was elevated.  Feeding vessels were bipolared.  The pronator quadratus was incised and elevated.  The periosteum was elevated around the fracture.  The fracture site was easily identified.  Was cleared of soft tissue and hematoma interposition.  Was reduced under direct visualization.  A 6-hole plate from the Acumed radial shaft plates was selected.  Was secured to the bone with the guidepins.  The C-arm was used in AP and lateral projections to ensure appropriate reduction position of hardware it was the case.  Standard AO drilling measuring technique was used.  The distal holes were filled first.  The proximal holes were then filled with the first being in a compression fashion.  Good compression was obtained at the fracture.  All screws had good purchase.  The C-arm was used in AP and  lateral projections to ensure appropriate reduction position of heart which was the case.  AP and lateral views of the wrist showed good reduction of the distal ulnar fragments.  The wound was copiously irrigated with sterile saline.  Inverted interrupted 4-0 Vicryl sutures were placed in subcutaneous tissues and skin was closed with 4-0 nylon in  a horizontal mattress fashion.  The wound was dressed with sterile Xeroform 4 x 4's and wrapped with a Kerlix bandage.  A sugar-tong splint was placed and wrapped with Kerlix and Ace bandage.  The tourniquet was deflated at 49 minutes.  Fingertips were pink with brisk capillary refill after deflation of tourniquet.  The operative  drapes were broken down.  The patient was awoken from anesthesia safely.  He was transferred back to the stretcher and taken to PACU in stable condition.  I will see him back in the office in 1 week for postoperative followup.  I will give him a prescription for Norco 5/325 1-2 tabs PO q6 hours prn pain, dispense # 20.   Leanora Cover, MD Electronically signed, 09/19/22

## 2022-09-19 NOTE — Anesthesia Procedure Notes (Signed)
Anesthesia Regional Block: Supraclavicular block   Pre-Anesthetic Checklist: , timeout performed,  Correct Patient, Correct Site, Correct Laterality,  Correct Procedure, Correct Position, site marked,  Risks and benefits discussed,  Surgical consent,  Pre-op evaluation,  At surgeon's request and post-op pain management  Laterality: Left  Prep: chloraprep       Needles:  Injection technique: Single-shot      Needle Length: 5cm  Needle Gauge: 22     Additional Needles: Arrow StimuQuik ECHO Echogenic Stimulating PNB Needle  Procedures:,,,, ultrasound used (permanent image in chart),,    Narrative:  Start time: 09/19/2022 12:31 PM End time: 09/19/2022 12:38 PM Injection made incrementally with aspirations every 5 mL.  Performed by: Personally  Anesthesiologist: Oleta Mouse, MD

## 2022-09-19 NOTE — Discharge Instructions (Signed)

## 2022-09-19 NOTE — Anesthesia Postprocedure Evaluation (Signed)
Anesthesia Post Note  Patient: Walter [name]  Procedure(s) Performed: OPEN REDUCTION INTERNAL FIXATION (ORIF) LEFT RADIUS FRACTURE (Left)     Patient location during evaluation: PACU Anesthesia Type: Regional Level of consciousness: awake and alert Pain management: pain level controlled Vital Signs Assessment: post-procedure vital signs reviewed and stable Respiratory status: spontaneous breathing, nonlabored ventilation, respiratory function stable and patient connected to nasal cannula oxygen Cardiovascular status: stable and blood pressure returned to baseline Postop Assessment: no apparent nausea or vomiting Anesthetic complications: no  No notable events documented.  Last Vitals:  Vitals:   09/19/22 1500 09/19/22 1515  BP: 121/74 116/71  Pulse: 66 66  Resp: 11 10  Temp:  36.7 C  SpO2: 97% 97%    Last Pain:  Vitals:   09/19/22 1515  TempSrc:   PainSc: 0-No pain                 Tiajuana Amass

## 2022-09-19 NOTE — Transfer of Care (Signed)
Immediate Anesthesia Transfer of Care Note  Patient: Walter [name]  Procedure(s) Performed: OPEN REDUCTION INTERNAL FIXATION (ORIF) LEFT RADIUS FRACTURE (Left)  Patient Location: PACU  Anesthesia Type:MAC  Level of Consciousness: awake and alert   Airway & Oxygen Therapy: Patient Spontanous Breathing  Post-op Assessment: Report given to RN and Post -op Vital signs reviewed and stable  Post vital signs: Reviewed and stable  Last Vitals:  Vitals Value Taken Time  BP 118/75 09/19/22 1430  Temp 36.5 C 09/19/22 1422  Pulse 71 09/19/22 1433  Resp 11 09/19/22 1433  SpO2 92 % 09/19/22 1433  Vitals shown include unvalidated device data.  Last Pain:  Vitals:   09/19/22 1422  TempSrc:   PainSc: 0-No pain         Complications: No notable events documented.

## 2022-09-19 NOTE — H&P (Signed)
Walter [name] is an 67 y.o. male.   Chief Complaint: left wrist fracture HPI: 67 yo male states he fell getting out of car injuring left wrist.  Seen in ED where XR revealed fracture of distal radial shaft.  Splinted and followed up in office.  He wishes to proceed with operative reduction and fixation.  Allergies:  Allergies  Allergen Reactions   Keppra [Levetiracetam] Swelling    Patient experienced angioedema post inpatient keppra dose   Gabapentin     Other Reaction(s): foggy   Latex Itching    Skin turns real red   Pregabalin     Other Reaction(s): foggy    Past Medical History:  Diagnosis Date   Atrial flutter (HCC)    s/p ablation   DVT (deep venous thrombosis) ([place])    Near syncope 09/25/2013   Paroxysmal atrial fibrillation ([place])    Presence of Watchman left atrial appendage closure device 07/11/2022   s/p LAAO with Watchman with a 31 mm Watchman Flex device by Dr. Quentin Ore.   Stroke ([place]) 09/25/2020   initially hemorragic (not on [facility]) followed by subsequent embolic stroke    Past Surgical History:  Procedure Laterality Date   A-FLUTTER ABLATION N/A 03/09/2020   Procedure: A-FLUTTER ABLATION;  Surgeon: Thompson Grayer, MD;  Location: [place] [facility];  Service: Cardiovascular;  Laterality: N/A;   CARDIOVERSION N/A 09/27/2013   Procedure: CARDIOVERSION;  Surgeon: Lelon Perla, MD;  Location: [facility] ENDOSCOPY;  Service: Cardiovascular;  Laterality: N/A;   IR ANGIO EXTERNAL CAROTID SEL EXT CAROTID UNI R MOD SED  09/26/2020   IR ANGIO INTRA EXTRACRAN SEL COM CAROTID INNOMINATE BILAT MOD SED  09/26/2020   IR ANGIO VERTEBRAL SEL VERTEBRAL UNI R MOD SED  09/26/2020   IR IVC FILTER PLMT / S&I /IMG GUID/MOD SED  10/08/2020   IR IVC FILTER RETRIEVAL / S&I /IMG GUID/MOD SED  09/28/2021   IR IVUS EACH ADDITIONAL NON CORONARY VESSEL  11/02/2020   IR PTA VENOUS EXCEPT DIALYSIS CIRCUIT  11/02/2020   IR RADIOLOGIST EVAL & MGMT  01/17/2021   IR RADIOLOGIST EVAL & MGMT  09/12/2021   IR  THROMBECT VENO MECH MOD SED  10/27/2020   IR THROMBECT VENO MECH MOD SED  11/02/2020   IR US GUIDE VASC ACCESS LEFT  10/27/2020   IR US GUIDE VASC ACCESS RIGHT  09/26/2020   IR US GUIDE VASC ACCESS RIGHT  11/02/2020   IR VENO/EXT/UNI RIGHT  11/02/2020   IR VENOCAVAGRAM IVC  10/27/2020   LEFT ATRIAL APPENDAGE OCCLUSION N/A 02/28/2022   Procedure: LEFT ATRIAL APPENDAGE OCCLUSION;  Surgeon: [name], MD;  Location: [place] [facility];  Service: Cardiovascular;  Laterality: N/A;   LEFT ATRIAL APPENDAGE OCCLUSION N/A 07/11/2022   Procedure: LEFT ATRIAL APPENDAGE OCCLUSION;  Surgeon: [name], MD;  Location: [place] [facility];  Service: Cardiovascular;  Laterality: N/A;   RETINAL DETACHMENT SURGERY     right knee arthroscopy     TEE WITHOUT CARDIOVERSION N/A 09/27/2013   Procedure: TRANSESOPHAGEAL ECHOCARDIOGRAM (TEE);  Surgeon: Lelon Perla, MD;  Location: [facility] ENDOSCOPY;  Service: Cardiovascular;  Laterality: N/A;   TEE WITHOUT CARDIOVERSION N/A 02/28/2022   Procedure: TRANSESOPHAGEAL ECHOCARDIOGRAM (TEE);  Surgeon: [name], MD;  Location: [place] [facility];  Service: Cardiovascular;  Laterality: N/A;   TEE WITHOUT CARDIOVERSION N/A 07/11/2022   Procedure: TRANSESOPHAGEAL ECHOCARDIOGRAM (TEE);  Surgeon: [name], MD;  Location: [place] [facility];  Service: Cardiovascular;  Laterality:  N/A;   WRIST SURGERY      Family History: Family History  Problem Relation Age of Onset   Heart disease Mother    Heart disease Father    Healthy Sister    Healthy Brother     Social History:   reports that he has never smoked. He has never used smokeless tobacco. He reports current alcohol use. He reports that he does not use drugs.  Medications: Medications Prior to Admission  Medication Sig Dispense Refill   atorvastatin (LIPITOR) 20 MG tablet Take 1 tablet (20 mg total) by mouth at bedtime. 90 tablet 3   clopidogrel (PLAVIX) 75 MG tablet Take 1 tablet (75 mg total) by  mouth daily. STOP ELIQUIS 2/25, START PLAVIX 2/26 90 tablet 3   DILT-XR 180 MG 24 hr capsule Take 180 mg by mouth daily.     EPINEPHrine 0.3 mg/0.3 mL IJ SOAJ injection Inject 0.3 mg into the muscle as needed for anaphylaxis.     mirtazapine (REMERON) 7.5 MG tablet TAKE 2 TABLETS BY MOUTH AT BEDTIME (Patient taking differently: Take 15 mg by mouth at bedtime.) 180 tablet 1   Probiotic Product (PROBIOTIC PO) Take 1 capsule by mouth daily.     sertraline (ZOLOFT) 100 MG tablet Take 100 mg by mouth at bedtime.     VENTOLIN HFA 108 (90 Base) MCG/ACT inhaler Inhale 1-2 puffs into the lungs every 6 (six) hours as needed for wheezing or shortness of breath.     oxyCODONE-acetaminophen (PERCOCET/ROXICET) 5-325 MG tablet Take 1 tablet by mouth every 6 (six) hours as needed for severe pain. (Patient not taking: Reported on 09/13/2022) 10 tablet 0    Results for orders placed or performed during the hospital encounter of 09/19/22 (from the past 48 hour(s))  Basic metabolic panel per protocol     Status: Abnormal   Collection Time: 09/19/22 10:40 AM  Result Value Ref Range   Sodium 138 135 - 145 mmol/L   Potassium 4.3 3.5 - 5.1 mmol/L   Chloride 104 98 - 111 mmol/L   CO2 25 22 - 32 mmol/L   Glucose, Bld 114 (H) 70 - 99 mg/dL    Comment: Glucose reference range applies only to samples taken after fasting for at least 8 hours.   BUN 10 8 - 23 mg/dL   Creatinine, Ser 0.90 0.61 - 1.24 mg/dL   Calcium 9.1 8.9 - 10.3 mg/dL   GFR, Estimated >60 >60 mL/min    Comment: (NOTE) Calculated using the CKD-EPI Creatinine Equation (2021)    Anion gap 9 5 - 15    Comment: Performed at [facility] [address], [place], [place] [address]  CBC per protocol     Status: None   Collection Time: 09/19/22 10:40 AM  Result Value Ref Range   WBC 5.6 4.0 - 10.5 K/uL   RBC 4.90 4.22 - 5.81 MIL/uL   Hemoglobin 15.1 13.0 - 17.0 g/dL   HCT 46.1 39.0 - 52.0 %   MCV 94.1 80.0 - 100.0 fL   MCH 30.8 26.0 - 34.0 pg    MCHC 32.8 30.0 - 36.0 g/dL   RDW 12.7 11.5 - 15.5 %   Platelets 232 150 - 400 K/uL   nRBC 0.0 0.0 - 0.2 %    Comment: Performed at [facility], [place] [address], [place], [place] [address]    DG MINI C-ARM IMAGE ONLY  Result Date: 09/19/2022 There is no interpretation for this exam.  This  order is for images obtained during a surgical procedure.  Please See "Surgeries" Tab for more information regarding the procedure.      Blood pressure 134/74, pulse 64, temperature 98.8 F (37.1 C), temperature source Oral, resp. rate 17, height 6' (1.829 m), weight 99.8 kg, SpO2 93 %.  General appearance: alert, cooperative, and appears stated age Head: Normocephalic, without obvious abnormality, atraumatic Neck: supple, symmetrical, trachea midline Extremities: Intact sensation and capillary refill all digits.  +epl/fpl/io.  No wounds.  Pulses: 2+ and symmetric Skin: Skin color, texture, turgor normal. No rashes or lesions Neurologic: Grossly normal Incision/Wound: none  Assessment/Plan Left distal radial shaft fracture.  Non operative and operative treatment options have been discussed with the patient and patient wishes to proceed with operative treatment. Risks, benefits, and alternatives of surgery have been discussed and the patient agrees with the plan of care.   Leanora Cover 09/19/2022, 12:39 PM

## 2022-09-19 NOTE — Progress Notes (Signed)
Orthopedic Tech Progress Note Patient Details:  Walter [name] 01/02/1956 [id number]  Ortho Devices Type of Ortho Device: Arm sling Ortho Device/Splint Location: LUE Ortho Device/Splint Interventions: Ordered, Application, Adjustment   Post Interventions Patient Tolerated: Well Instructions Provided: Care of device  [name] Jeri Modena 09/19/2022, 5:14 PM

## 2022-09-19 NOTE — Op Note (Signed)
I assisted Surgeon(s) and Role:    * Leanora Cover, MD - Primary    Daryll Brod, MD - Assisting on the Procedure(s): OPEN REDUCTION INTERNAL FIXATION (ORIF) LEFT RADIUS FRACTURE on 09/19/2022.  I provided assistance on this case as follows: Set up, approach, identification of the radial artery with protection, identification of the fracture, debridement of the fracture, reduction of the fracture, stabilization of the fracture, location of plate and screws, return to the wound and application of the dressing and splint.  Electronically signed by: Daryll Brod, MD Date: 09/19/2022 Time: 2:16 PM

## 2022-09-20 ENCOUNTER — Encounter (HOSPITAL_COMMUNITY): Payer: Self-pay | Admitting: Orthopedic Surgery

## 2022-10-02 ENCOUNTER — Telehealth: Payer: Self-pay

## 2022-10-02 NOTE — Patient Outreach (Signed)
  Care Coordination   10/02/2022 Name: Walter [name] MRN: [id number] DOB: June 03, 1956   Care Coordination Outreach Attempts:  An unsuccessful telephone outreach was attempted today to offer the patient information about available care coordination services as a benefit of their health plan.   Follow Up Plan:  Additional outreach attempts will be made to offer the patient care coordination information and services.   Encounter Outcome:  No Answer   Care Coordination Interventions:  No, not indicated    SIG  Peter Garter RN, BSN,CCM, CDE Care Management Coordinator [facility] [phone]

## 2022-10-02 NOTE — Patient Outreach (Signed)
  Care Coordination   Initial Visit Note   10/02/2022 Name: Walter [name] MRN: [id number] DOB: 03-May-1956  Walter [name] is a 67 y.o. year old male who sees [place], [place].Marlou Sa, MD for primary care. I spoke with  Kaylyn Lim by phone today.  What matters to the patients health and wellness today?  No concerns today.  States he is healing well from his surgery    Goals Addressed             This Visit's Progress    COMPLETED: Care Coordination Activities- No Follow Up Required       Interventions Today    Flowsheet Row Most Recent Value  General Interventions   General Interventions Discussed/Reviewed General Interventions Discussed, Doctor Visits  [declines needing any further RNCM interventions]  Doctor Visits Discussed/Reviewed Doctor Visits Discussed, Annual Wellness Visits, PCP  PCP/Specialist Visits Compliance with follow-up visit  Education Interventions   Education Provided Provided Education  Provided Verbal Education On Other  [care coordination services]              SDOH assessments and interventions completed:  Yes  SDOH Interventions Today    Flowsheet Row Most Recent Value  SDOH Interventions   Food Insecurity Interventions Intervention Not Indicated  Housing Interventions Intervention Not Indicated  Transportation Interventions Intervention Not Indicated  Utilities Interventions Intervention Not Indicated        Care Coordination Interventions:  Yes, provided   Follow up plan: No further intervention required.   Encounter Outcome:  Pt. Visit Completed  Peter Garter RN, BSN,CCM, CDE Care Management Coordinator [facility] [phone]

## 2022-10-02 NOTE — Patient Instructions (Signed)
Visit Information  Thank you for taking time to visit with me today. Please don't hesitate to contact me if I can be of assistance to you.   Following are the goals we discussed today:   Goals Addressed             This Visit's Progress    COMPLETED: Care Coordination Activities- No Follow Up Required       Interventions Today    Flowsheet Row Most Recent Value  General Interventions   General Interventions Discussed/Reviewed General Interventions Discussed, Doctor Visits  [declines needing any further RNCM interventions]  Doctor Visits Discussed/Reviewed Doctor Visits Discussed, Annual Wellness Visits, PCP  PCP/Specialist Visits Compliance with follow-up visit  Education Interventions   Education Provided Provided Education  Provided Verbal Education On Other  [care coordination services]               If you are experiencing a Mental Health or [place] or need someone to talk to, please call the Suicide and Crisis Lifeline: 988 call the [place] National Suicide Prevention Lifeline: 435-088-4977 or TTY: 267 081 5452 TTY 6287726164) to talk to a trained counselor call 1-800-273-TALK (toll free, 24 hour hotline) go to [facility] [phone]) call 911   Patient verbalizes understanding of instructions and care plan provided today and agrees to view in [place]. Active MyChart status and patient understanding of how to access instructions and care plan via MyChart confirmed with patient.     No further follow up required:    Wiley [place], Jackquline Denmark, [place] [facility] [phone]

## 2022-10-16 ENCOUNTER — Ambulatory Visit (INDEPENDENT_AMBULATORY_CARE_PROVIDER_SITE_OTHER): Payer: Medicare Other | Admitting: Neurology

## 2022-10-16 ENCOUNTER — Encounter: Payer: Self-pay | Admitting: Neurology

## 2022-10-16 VITALS — BP 129/74 | HR 73 | Ht 72.0 in | Wt 224.0 lb

## 2022-10-16 DIAGNOSIS — G119 Hereditary ataxia, unspecified: Secondary | ICD-10-CM

## 2022-10-16 DIAGNOSIS — I69359 Hemiplegia and hemiparesis following cerebral infarction affecting unspecified side: Secondary | ICD-10-CM | POA: Diagnosis not present

## 2022-10-16 DIAGNOSIS — G250 Essential tremor: Secondary | ICD-10-CM

## 2022-10-16 DIAGNOSIS — H532 Diplopia: Secondary | ICD-10-CM | POA: Diagnosis not present

## 2022-10-16 NOTE — Patient Instructions (Signed)
I had a long discussion with the patient and his caregiver regarding his remote brainstem hemorrhage and significant disability diplopia, left hemiataxia, right body paresthesia which appears to be quite disabling.  He also has new head tremor for the last 1 year with it does not appear to be disabling at the present time.  Patient has failed trials of gabapentin, Lyrica and Topamax in the past due to intolerance and not willing to try further medications for the same.  Continue Plavix for stroke prevention s/p Watchman device for A-fib.  He was advised to use his walker at all times until follow-up in the future in 6 months or call earlier if necessary.

## 2022-10-16 NOTE — Progress Notes (Signed)
[facility] [address] [place]. [place] [address] [phone]       OFFICE FOLLOW-UP NOTE  [name] Date of Birth:  [birth year] Medical Record Number:  [id number]   HPI: Initial visit 12/14/2020: Walter [name] is a 67 year old pleasant Caucasian male seen today for initial office follow-up visit following hospital admission for intracerebral hemorrhage in March 2022.  History is obtained from  patient and his wife and review of electronic medical records and personally reviewed pertinent imaging films in PACS.  He has past medical history of atrial flutter and newly diagnosed A. fib in February 2022 not on anticoagulation who presented on 09/25/2020 upon awakening from sleep with discomfort in his head and ear and slurred speech.  Upon arrival he was drowsy and sleepy with blood pressure in the 140-150 systolic range.  He required emergent intubation due to inability to protect his airway with a Glasgow Coma Scale of 5.  NIH stroke scale was 22.  He was also found to have right-sided weakness.  CT scan showed a left midbrain hemorrhage involving the dorsal part of the pons as well with extension into the cerebral aqueduct and fourth ventricle with local mass-effect on the brainstem but no hydrocephalus.  CT angiogram of the head and neck showed no large vessel occlusion or AV malformation.  ICH score was 3.  Patient was admitted to the intensive care unit.  He was started on hypertonic saline and had repeat CT scan which showed worsening hydrocephalus and neurosurgery was consulted who placed a right frontal ventriculostomy catheter.  MRI scan of the brain was obtained which confirmed acute hemorrhage within the left dorsal midbrain and pons extending to inferior medial thalamus with intraventricular extension.  There is no abnormal enhancement to suggest underlying structural or vascular lesion.  2D echo showed normal ejection fraction.  LDL cholesterol is 88 mg percent.   Hemoglobin A1c was 5.7.  Cerebral catheter angiogram was obtained and 09/27/2018 emergently which showed no evidence of AVM on formation, venous sinus thrombosis vascular abnormalities.  Patient showed neurological improvement after the EVD was placed and was following commands and opening his eyes.  The patient subsequent hospital course was complicated by development of deep vein thrombosis and pulmonary embolism eventually requiring to be started on anticoagulation.  He also required a tracheostomy and placement of IVC filter.  Long-term EEG monitoring showed triphasics but no definite seizure activity.  He was eventually weaned off ventilatory support and transferred to inpatient rehab on 10/26/2020 where he stayed for 4 weeks and made gradual improvement and was discharged home on 11/23/2020.  Patient was initially scheduled to see me in 2 months but the patient's wife had several questions about his follow-up care and I will prompting him to be seen sooner today.  Patient is currently at home he is getting home physical and occupational therapy.  He has significant truncal and hemiataxia and requires 1 person assist to even stand and can walk only using the safety belt and with the therapists.  Spent most of his time in the wheelchair.  His voice is very hypophonic.  He recently saw Dr. Ezzard Standing ENT who did the fiberoptic endoscopy and did not find any significant abnormalities of the vocal cords.  He was complaining of significant fatigue and tiredness and Dr. Wynn Banker recently added Provigil 100 mg in the morning.  Patient did have some placement sleep problems which appear to be more exacerbated in the last 2 weeks since starting Provigil.  He does take trazodone 50 mg at night but its not helping.  Patient has lot of questions about his timing and extent of recovery.  He is also read up on rehab trials as well as hyperbaric oxygen and has questions about using these.  He continues to have significant vertical  diplopia and is wearing an eye patch alternatingly on both eyes constantly.  He has seen Dr. Dione Booze who plans to see him back in July and may prescribe prisms if diplopia persists. Update 04/16/2021 : He returns for follow-up after last visit 4 months ago.  He is accompanied by his wife.  Patient continues to improve with ongoing therapies.  He is now able to ambulate and uses a walker for long distances.  He unfortunately sprained his right ankle and is currently wearing a boot at this has set him back a little bit.  His voice has improved but diplopia persists.  He has seen Dr. Karleen Hampshire neuro-ophthalmologist in [facility] who prescribed prisms but they do not seem to be working.  He is planning to see another ophthalmologist for a second opinion.  He did have repeat MRI scan done on 12/25/2020 which showed expected evolutionary changes in the left posterior midbrain hemorrhage with no under lying features to suggest vascular neoplastic lesion.  There was a tiny enhancing vessel at the bed of the hematoma which is of unclear significance.  There are also tiny chronic infarcts noted in the left frontal, parietal and bilateral cerebellar regions.  Patient has been taking Eliquis for his A. fib and tolerating it well without bleeding or bruising.  He has had no recurrent stroke or TIA symptoms.  He is also bothered by paresthesias in the right side of his body and is willing to try medications for it.  He can move around independently at home and in fact he has even been driving a bit. Update 10/15/2021 : He returns for follow-up after last visit 6 months ago.  He is accompanied by his wife.  He continues to have significant postop paresthesias on the right side as well as left body hemiataxia.  Uses a walker to walk most of the time but uses wheelchair for long distances.  Patient was tried on Topamax for his paresthesias but did not tolerated due to side effects and gabapentin was found to be not effective.  He has  noticed improvement in his voice.  Patient recently stopped Eliquis a month ago after his IVC filter was removed.  He has been referred by cardiology to electrophysiology and is being considered for the Watchman procedure.  He still has vertical diplopia but wears special prisms with tapes on the glasses and notices diplopia only when he looks up and when he is not wearing the prisms his blood pressure is well controlled today 126/74.  Patient's wife states that he does not want to take his medications the Remeron which seems to help him sleep well at night as well as Zoloft. Updated 04/16/2022 : He returns for follow-up after last visit 6 months ago.  He continues to have significant residual postop paresthesias, left body and ataxia as well as diplopia.  He states he did not take Topamax long enough to know if it was working and Lyrica did not help him.  He is still bothered by the paresthesias willing to try Topamax again.  He still has vertical diplopia is now wearing prism to help him somewhat.  Ataxia on the left side.  He is able  to ambulate a little bit with one-person assist was scheduled to undergo Watchman device in August intraprocedure TEE showed mild left atrial clot procedure hence watchman procedure was aborted.  He is back on Eliquis with plan to do Watchman device now in January 2024.  Patient has sold his house including another house which is 1 level it is not ready yet and to  temporarily.[name] Updated 10/16/2022 : He returns for follow-up after last visit 6 months ago.  He is accompanied by his caregiver today.  Patient did not tolerate Topamax at last visit for his paresthesias and discontinued due to side effects.He has also been unable to tolerate gabapentin and Lyrica.[name]  He is not willing to try other medications like Tegretol or nortriptyline at the present time.  Patient had Watchman device inserted in January 2024 and Eliquis was switched to Plavix which is tolerating well without  significant bruising or bleeding.  He had a 5-minute fall and fractured his left radius quiring surgery with Dr. Merlyn Lot on 09/19/2022.  He still has a splint bandage in his left forearm.  He ambulates with a walker but he still has been using wheelchair more often he has less acute stage of his left hand.  He is no longer doing aquatic therapy and his insurance paying for it.  He is breathing a therapy called at his house which will be completed soon. ROS:   14 system review of systems is positive for weakness, imbalance, walking difficulty, depression , double vision  PMH:  Past Medical History:  Diagnosis Date   Atrial flutter    s/p ablation   DVT (deep venous thrombosis)    Near syncope 09/25/2013   Paroxysmal atrial fibrillation    Presence of Watchman left atrial appendage closure device 07/11/2022   s/p LAAO with Watchman with a 31 mm Watchman Flex device by Dr. Lalla Brothers.   Stroke 09/25/2020   initially hemorragic (not on OAC) followed by subsequent embolic stroke    Social History:  Social History   Socioeconomic History   Marital status: Married    Spouse name: cathy   Number of children: Not on file   Years of education: Not on file   Highest education level: Not on file  Occupational History   Not on file  Tobacco Use   Smoking status: Never   Smokeless tobacco: Never  Vaping Use   Vaping Use: Never used  Substance and Sexual Activity   Alcohol use: Yes    Comment: occasionally   Drug use: No   Sexual activity: Not on file  Other Topics Concern   Not on file  Social History Narrative   Lives with wife at home   Right Handed   Drinks decaf   Social Determinants of Health   Financial Resource Strain: Not on file  Food Insecurity: Unknown (10/02/2022)   Hunger Vital Sign    Worried About Running Out of Food in the Last Year: Never true    Ran Out of Food in the Last Year: Not on file  Transportation Needs: No Transportation Needs (10/02/2022)   PRAPARE -  Administrator, Civil Service (Medical): No    Lack of Transportation (Non-Medical): No  Physical Activity: Not on file  Stress: Not on file  Social Connections: Not on file  Intimate Partner Violence: Not on file    Medications:   Current Outpatient Medications on File Prior to Visit  Medication Sig Dispense Refill   atorvastatin (LIPITOR) 20 MG  tablet Take 1 tablet (20 mg total) by mouth at bedtime. 90 tablet 3   clopidogrel (PLAVIX) 75 MG tablet Take 1 tablet (75 mg total) by mouth daily. STOP ELIQUIS 2/25, START PLAVIX 2/26 90 tablet 3   DILT-XR 180 MG 24 hr capsule Take 180 mg by mouth daily.     EPINEPHrine 0.3 mg/0.3 mL IJ SOAJ injection Inject 0.3 mg into the muscle as needed for anaphylaxis.     metoprolol tartrate (LOPRESSOR) 50 MG tablet Take 50 mg by mouth 2 (two) times daily.     mirtazapine (REMERON) 7.5 MG tablet TAKE 2 TABLETS BY MOUTH AT BEDTIME (Patient taking differently: Take 15 mg by mouth at bedtime.) 180 tablet 1   Probiotic Product (PROBIOTIC PO) Take 1 capsule by mouth daily.     sertraline (ZOLOFT) 100 MG tablet Take 100 mg by mouth at bedtime.     VENTOLIN HFA 108 (90 Base) MCG/ACT inhaler Inhale 1-2 puffs into the lungs every 6 (six) hours as needed for wheezing or shortness of breath.     No current facility-administered medications on file prior to visit.    Allergies:   Allergies  Allergen Reactions   Keppra [Levetiracetam] Swelling    Patient experienced angioedema post inpatient keppra dose   Gabapentin     Other Reaction(s): foggy   Latex Itching    Skin turns real red   Pregabalin     Other Reaction(s): foggy    Physical Exam General: well developed, well nourished middle-aged Caucasian male, seated, in no evident distress Head: head normocephalic and atraumatic.  Neck: supple with no carotid or supraclavicular bruits Cardiovascular: regular rate and rhythm, no murmurs Musculoskeletal: no deformity.  Wearing left forearm splint  bandage for fracture Skin:  no rash/petichiae Vascular:  Normal pulses all extremities Vitals:   10/16/22 1421  BP: 129/74  Pulse: 73   Neurologic Exam Mental Status: Awake and fully alert. Oriented to place and time. Recent and remote memory intact. Attention span, concentration and fund of knowledge appropriate. Mood and affect appropriate.  Speech is slightly  dysarthric and hypophonic. Cranial Nerves: Fundoscopic exam not done. Pupils equal, briskly reactive to light. Extraocular movements show skew deviation with left eye hypotrophic and moving less in vertical direction compared to the right.  Patient has binocular vertical diplopia which abolishes with closing either eye.  Dysmetric saccades on the right gaze.  Visual fields full to confrontation. Hearing intact. Facial sensation intact. Face, tongue, palate moves normally and symmetrically.  Motor: Normal bulk and tone. Normal strength in all tested extremity muscles except unable to test in the left upper extremity due to splint and bandaged from surgery   Mild weakness of left hip flexors and ankle dorsiflexors. Sensory.: impaired touch and pinprick on the right hemibody  coordination: Prominent left finger-to-nose and mild left knee to heel ataxia.. Gait and Station: Deferred today as patient is in a wheelchair Reflexes: 1+ and symmetric. Toes downgoing.      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  Prolonged hospitalization complicated by chronic atrial fibrillation, acute pulmonary embolism with respiratory failure, bilateral lower extremity DVT, tracheostomy, dysphagia poststroke and mild malnutrition.  Patient is making now slow improvement with ongoing therapies but diplopia and poststroke paresthesias continues to be bothersome and he has failed trials of multiple  medications.  New head tremor since last 1 month appears not to be  functionally and scheduled off on medication  .[name]       PLAN: I had a long discussion with the patient and his caregiver regarding his remote brainstem hemorrhage and significant disability diplopia, left hemiataxia, right body paresthesia which appears to be quite disabling.  He also has new head tremor for the last 1 year with it does not appear to be disabling at the present time.  Patient has failed trials of gabapentin, Lyrica and Topamax in the past due to intolerance and not willing to try further medications for the same.  Continue Plavix for stroke prevention s/p Watchman device for A-fib.  He was advised to use his walker at all times until follow-up in the future in 6 months or call earlier if necessary. Greater than 50% of time during this 35 minute  visit was spent on counseling,explanation of diagnosis, of his indeterminate intracerebral hemorrhage and atrial fibrillation and risk benefit of anticoagulation planning of further management, discussion with patient and family and coordination of care Delia Heady, MD Note: This document was prepared with digital dictation and possible smart phrase technology. Any transcriptional errors that result from this process are unintentional

## 2022-11-22 ENCOUNTER — Other Ambulatory Visit: Payer: Self-pay | Admitting: Neurology

## 2023-01-07 NOTE — Progress Notes (Unsigned)
[facility]                                     Cardiology Office Note:    Date:  01/07/2023   ID:  Walter [name], DOB 11/25/1955, MRN [id number]  PCP:  Asencion Gowda.August Saucer, MD  [facility] Cardiologist:  None  [facility] Electrophysiologist:  Lanier Prude, MD   Referring MD: Clovis Riley, Elbert Ewings.August Saucer, MD   No chief complaint on file. ***  History of Present Illness:    Walter [name] is a 67 y.o. male with a hx of HTN, paroxysmal atrial fib/flutter s/p ablation, DVT/PE, spontaneous intracranial hemorrhage 08/2020, and cardio embolic CVA who presented to [facility] on 07/11/22 for planned LAAO and is being seen for 6 month follow up today.   Walter [name] was diagnosed with AF 08/2020. He presented to [facility] 09/25/20 after awaking from sleep with slurred speech and HA. On ED arrival he was intubated due to lethargy. Emergent head CT was performed which showed a left midbrain hemorrhage involving the dorsal part of the pons as well with extension into the cerebral aqueduct and fourth ventricle with local mass-effect on the brainstem but no hydrocephalus. CT angiogram of the head and neck showed no large vessel occlusion or AV malformation. He was started on hypertonic saline and had repeat CT scan which showed worsening hydrocephalus and neurosurgery was consulted who placed a right frontal ventriculostomy catheter. MRI scan of the brain was obtained which confirmed acute hemorrhage within the left dorsal midbrain and pons extending to inferior medial thalamus with intraventricular extension. After EVD placement, he showed neurological improvement. Hospital course was c/b a DVT and PE requiring anticoagulation. He also required a tracheostomy and IVC filter which has since been removed.    He was transferred to to inpatient rehab on 10/26/21 where he stayed 4 weeks and went home 11/23/20. In last follow up with Dr. Pearlean Brownie 09/2021, he was felt to be a good candidate for Watchman implant. He was  referred for LAAO by Dr. Ladona Ridgel and seen by Dr. Lalla Brothers. He was scheduled for LAAO closure 02/28/22. Unfortunately, procedure attempted however case was aborted after echodensity seen on TEE with concern for possible LAAO thrombus. He was restarted on Eliquis with initial plan to follow with a repeat TEE scheduled for 10/4. This was later cancelled due to patient concern of swallowing issues. CT not felt to give great quality images based on the size of the echodensity. Plan changed to re attempt LAAO with a TEE on the table prior to the procedure.    He is now s/p LAAO closure with Watchman FLX 31mm device. He is here today with his wife and reports he has been doing well since implant with no chest pain, palpitations, LE edema, bleeding, orthopnea, SOB, dizziness or syncope. He was restarted on Eliquis 5mg  PO twice daily with plans to stop after 45 days. At that time he will start Plavix 75mg  QD through 6 months. He will undergo post LAAO CT at 60 days.     PAF with history of intracranial hemorrhage: initially had an aborted procedure however this was rescheduled and he is now s/p successful LAAO closure with Watchman FLX 31mm without issues. He is doing well today and will take last doses of Eliquis 2/25. He will start Plavix 75mg  QD on 2/26. CT instructions reviewed. BMET today. Will follow CT results. Plan 6  month follow up 12/2022.      Past Medical History:  Diagnosis Date   Atrial flutter [facility])    s/p ablation   DVT (deep venous thrombosis) (HCC)    Near syncope 09/25/2013   Paroxysmal atrial fibrillation (HCC)    Presence of Watchman left atrial appendage closure device 07/11/2022   s/p LAAO with Watchman with a 31 mm Watchman Flex device by Dr. Lalla Brothers.   Stroke (HCC) 09/25/2020   initially hemorragic (not on [facility]) followed by subsequent embolic stroke    Past Surgical History:  Procedure Laterality Date   A-FLUTTER ABLATION N/A 03/09/2020   Procedure: A-FLUTTER ABLATION;  Surgeon:  Hillis Range, MD;  [facility]: [facility] INVASIVE CV LAB;  Service: Cardiovascular;  Laterality: N/A;   CARDIOVERSION N/A 09/27/2013   Procedure: CARDIOVERSION;  Surgeon: Lewayne Bunting, MD;  Location: [facility] ENDOSCOPY;  Service: Cardiovascular;  Laterality: N/A;   IR ANGIO EXTERNAL CAROTID SEL EXT CAROTID UNI R MOD SED  09/26/2020   IR ANGIO INTRA EXTRACRAN SEL COM CAROTID INNOMINATE BILAT MOD SED  09/26/2020   IR ANGIO VERTEBRAL SEL VERTEBRAL UNI R MOD SED  09/26/2020   IR IVC FILTER PLMT / S&I /IMG GUID/MOD SED  10/08/2020   IR IVC FILTER RETRIEVAL / S&I /IMG GUID/MOD SED  09/28/2021   IR IVUS EACH ADDITIONAL NON CORONARY VESSEL  11/02/2020   IR PTA VENOUS EXCEPT DIALYSIS CIRCUIT  11/02/2020   IR RADIOLOGIST EVAL & MGMT  01/17/2021   IR RADIOLOGIST EVAL & MGMT  09/12/2021   IR THROMBECT VENO MECH MOD SED  10/27/2020   IR THROMBECT VENO MECH MOD SED  11/02/2020   IR US GUIDE VASC ACCESS LEFT  10/27/2020   IR US GUIDE VASC ACCESS RIGHT  09/26/2020   IR US GUIDE VASC ACCESS RIGHT  11/02/2020   IR VENO/EXT/UNI RIGHT  11/02/2020   IR VENOCAVAGRAM IVC  10/27/2020   LEFT ATRIAL APPENDAGE OCCLUSION N/A 02/28/2022   Procedure: LEFT ATRIAL APPENDAGE OCCLUSION;  Surgeon: Lanier Prude, MD;  [facility]: [facility] INVAS[facility] CV LAB;  Service: Cardiovascular;  Laterality: N/A;   LEFT ATRIAL APPENDAGE OCCLUSION N/A 07/11/2022   Procedure: LEFT ATRIAL APPENDAGE OCCLUSION;  Surgeon: Lanier Prude, MD;  Location: [facility] INVASIVE CV LAB;  Service: Cardiovascular;  Laterality: N/A;   OPEN REDUCTION INTERNAL FIXATION (ORIF) DISTAL RADIAL FRACTURE Left 09/19/2022   Procedure: OPEN REDUCTION INTERNAL FIXATION (ORIF) LEFT RADIUS FRACTURE;  Surgeon: Betha Loa, MD;  Location: [facility] OR;  Service: Orthopedics;  Laterality: Left;  75 MIN   RETINAL DETACHMENT SURGERY     right knee arthroscopy     TEE WITHOUT CARDIOVERSION N/A 09/27/2013   Procedure: TRANSESOPHAGEAL ECHOCARDIOGRAM (TEE);  Surgeon: Lewayne Bunting, MD;  Location: [facility] ENDOSCOPY;  Service:  Cardiovascular;  Laterality: N/A;   TEE WITHOUT CARDIOVERSION N/A 02/28/2022   Procedure: TRANSESOPHAGEAL ECHOCARDIOGRAM (TEE);  Surgeon: Lanier Prude, MD;  Location: [facility] INVASIVE CV LAB;  Service: Cardiovascular;  Laterality: N/A;   TEE WITHOUT CARDIOVERSION N/A 07/11/2022   Procedure: TRANSESOPHAGEAL ECHOCARDIOGRAM (TEE);  Surgeon: Lanier Prude, MD;  Location: [facility] INVASIVE CV LAB;  Service: Cardiovascular;  Laterality: N/A;   WRIST SURGERY      Current Medications: No outpatient medications have been marked as taking for the 01/08/23 encounter (Appointment) with [facility].     Allergies:   Keppra [levetiracetam], Gabapentin, Latex, and Pregabalin   Social History   Socioeconomic History   Marital status: Married    Spouse name: cathy  Number of children: Not on file   Years of education: Not on file   Highest education level: Not on file  Occupational History   Not on file  Tobacco Use   Smoking status: Never   Smokeless tobacco: Never  Vaping Use   Vaping Use: Never used  Substance and Sexual Activity   Alcohol use: Yes    Comment: occasionally   Drug use: No   Sexual activity: Not on file  Other Topics Concern   Not on file  Social History Narrative   Lives with wife at home   Right Handed   Drinks decaf   Social Determinants of Health   Financial Resource Strain: Not on file  Food Insecurity: Unknown (10/02/2022)   Hunger Vital Sign    Worried About Running Out of Food in the Last Year: Never true    Ran Out of Food in the Last Year: Not on file  Transportation Needs: No Transportation Needs (10/02/2022)   PRAPARE - Administrator, Civil Service (Medical): No    Lack of Transportation (Non-Medical): No  Physical Activity: Not on file  Stress: Not on file  Social Connections: Not on file     Family History: The patient's ***family history includes Healthy in his brother and sister; Heart disease in his father and  mother.  ROS:   Please see the history of present illness.    All other systems reviewed and are negative.  EKGs/Labs/Other Studies Reviewed:    The following studies were reviewed today:   Cardiac Studies & Procedures       ECHOCARDIOGRAM  ECHOCARDIOGRAM COMPLETE 10/19/2021  Narrative ECHOCARDIOGRAM REPORT    Patient Name:   Walter [name] Date of Exam: 10/19/2021 Medical Rec #:  [id number]       Height:       72.0 in Accession #:    [id number]      Weight:       229.6 lb Date of Birth:  1956/06/04       BSA:          2.259 m Patient Age:    66 years        BP:           128/78 mmHg Patient Gender: M               HR:           71 bpm. Exam Location:  [address]  Procedure: 2D Echo, Cardiac Doppler and Color Doppler  Indications:    I48.0 Paroxysmal atrial fibrillation  History:        Patient has prior history of Echocardiogram examinations, most recent 09/25/2020. Stroke, Pre-op evaluation, Arrythmias:Atrial Fibrillation; Risk Factors:Hypertension.  Sonographer:    Samule Ohm RDCS Referring Phys: [id number] Rossie Muskrat LAMBERT  IMPRESSIONS   1. Left ventricular ejection fraction, by estimation, is 60 to 65%. The left ventricle has normal function. The left ventricle has no regional wall motion abnormalities. There is moderate left ventricular hypertrophy. Left ventricular diastolic parameters are consistent with Grade I diastolic dysfunction (impaired relaxation). 2. Right ventricular systolic function is normal. The right ventricular size is normal. Tricuspid regurgitation signal is inadequate for assessing PA pressure. 3. The mitral valve is abnormal. Mild mitral valve regurgitation. 4. The aortic valve was not well visualized. Aortic valve regurgitation is not visualized. 5. The inferior vena cava is normal in size with greater than 50% respiratory variability, suggesting right atrial pressure of 3 mmHg.  Comparison(s): Changes from prior study are noted.  09/25/2020: LVEF 55-60%.  FINDINGS Left Ventricle: Left ventricular ejection fraction, by estimation, is 60 to 65%. The left ventricle has normal function. The left ventricle has no regional wall motion abnormalities. The left ventricular internal cavity size was normal in size. There is moderate left ventricular hypertrophy. Left ventricular diastolic parameters are consistent with Grade I diastolic dysfunction (impaired relaxation). Indeterminate filling pressures.  Right Ventricle: The right ventricular size is normal. No increase in right ventricular wall thickness. Right ventricular systolic function is normal. Tricuspid regurgitation signal is inadequate for assessing PA pressure.  Left Atrium: Left atrial size was normal in size.  Right Atrium: Right atrial size was normal in size.  Pericardium: There is no evidence of pericardial effusion.  Mitral Valve: The mitral valve is abnormal. Mild mitral valve regurgitation.  Tricuspid Valve: The tricuspid valve is grossly normal. Tricuspid valve regurgitation is trivial.  Aortic Valve: The aortic valve was not well visualized. Aortic valve regurgitation is not visualized.  Pulmonic Valve: The pulmonic valve was not well visualized. Pulmonic valve regurgitation is not visualized.  Aorta: The aortic root and ascending aorta are structurally normal, with no evidence of dilitation.  Venous: The inferior vena cava is normal in size with greater than 50% respiratory variability, suggesting right atrial pressure of 3 mmHg.  IAS/Shunts: No atrial level shunt detected by color flow Doppler.   LEFT VENTRICLE PLAX 2D LVIDd:         4.80 cm   Diastology LVIDs:         3.30 cm   LV e' medial:    8.70 cm/s LV PW:         1.30 cm   LV E/e' medial:  9.9 LV IVS:        1.30 cm   LV e' lateral:   11.70 cm/s LVOT diam:     2.20 cm   LV E/e' lateral: 7.4 LV SV:         76 LV SV Index:   34 LVOT Area:     3.80 cm   RIGHT VENTRICLE              IVC RV S prime:     20.20 cm/s  IVC diam: 1.40 cm TAPSE (M-mode): 2.5 cm  LEFT ATRIUM             Index        RIGHT ATRIUM           Index LA diam:        4.40 cm 1.95 cm/m   RA Pressure: 3.00 mmHg LA Vol (A2C):   65.1 ml 28.81 ml/m  RA Area:     17.40 cm LA Vol (A4C):   63.4 ml 28.06 ml/m  RA Volume:   42.80 ml  18.94 ml/m LA Biplane Vol: 64.2 ml 28.42 ml/m AORTIC VALVE LVOT Vmax:   96.60 cm/s LVOT Vmean:  66.300 cm/s LVOT VTI:    0.200 m  AORTA Ao Root diam: 3.90 cm Ao Asc diam:  3.60 cm  MITRAL VALVE               TRICUSPID VALVE MV Area (PHT): 3.63 cm    Estimated RAP:  3.00 mmHg MV Decel Time: 209 msec MV E velocity: 86.50 cm/s  SHUNTS MV A velocity: 72.30 cm/s  Systemic VTI:  0.20 m MV E/A ratio:  1.20        Systemic Diam: 2.20 cm  Iantha Fallen  Hilty MD Electronically signed by Zoila Shutter MD Signature Date/Time: 10/19/2021/4:55:14 PM    Final   TEE  ECHO TEE 07/11/2022  Narrative TRANSESOPHOGEAL ECHO REPORT    Patient Name:   Walter [name] Date of Exam: 07/11/2022 Medical Rec #:  [id number]       Height:       72.0 in Accession #:    [id number]      Weight:       225.0 lb Date of Birth:  1956-03-09       BSA:          2.240 m Patient Age:    66 years        BP:           144/85 mmHg Patient Gender: M               HR:           68 bpm. Exam Location:  Inpatient  Procedure: Transesophageal Echo, 3D Echo, Color Doppler, Cardiac Doppler and Intracardiac Opacification Agent  Indications:     I48.1 Persistent atrial fibrillation  History:         Patient has prior history of Echocardiogram examinations, most recent 02/28/2022. Arrythmias:Atrial Fibrillation and Atrial Flutter; Risk Factors:Hypertension.  Sonographer:     Irving Burton Senior RDCS Referring Phys:  [id number] Lanier Prude Diagnosing Phys: Riley Lam MD   Sonographer Comments: Watchman Procedure   PROCEDURE: After discussion of the risks and benefits of a TEE, an informed  consent was obtained from the patient. The transesophogeal probe was passed without difficulty through the esophogus of the patient. Sedation performed by different physician. The patient was monitored while under deep sedation. The patient developed no complications during the procedure.  IMPRESSIONS   1. Interventional TEE for LAA-O placement. Prior to procedure, patent left atrial appendage. Maximal dimension 2.5 cm suitable for a 31 mm FLX device. Prior to transeptal puncture, the filamentous lesions seen in prior TEE was again visualized. Contrast imaging was performed, showing no evidence of thrombus. Succesful transeptal puncture. Placement of a 31 mm with small mitral shoulder small anterior gutter but with no peri-device leak and compression of ~ 26%. Post procedure there was a left to right interatrial shunt and a trivial pericardial effusion, unchanged from prior.. No left atrial/left atrial appendage thrombus was detected. 2. Left ventricular ejection fraction, by estimation, is 60 to 65%. The left ventricle has normal function. 3. Right ventricular systolic function is normal. The right ventricular size is normal. 4. The mitral valve is abnormal. Trivial mitral valve regurgitation. No evidence of mitral stenosis. 5. The aortic valve is tricuspid. Aortic valve regurgitation is not visualized. 6. Evidence of atrial level shunting detected by color flow Doppler.  Comparison(s): No change from prior.  FINDINGS Left Ventricle: Left ventricular ejection fraction, by estimation, is 60 to 65%. The left ventricle has normal function. Definity contrast agent was given IV to delineate the left ventricular endocardial borders. The left ventricular internal cavity size was normal in size.  Right Ventricle: The right ventricular size is normal. Right vetricular wall thickness was not well visualized. Right ventricular systolic function is normal.  Left Atrium: Interventional TEE for LAA-O  placement. Prior to procedure, patent left atrial appendage. Maximal dimension 2.5 cm suitable for a 31 mm FLX device. Prior to transeptal puncture, the filamentous lesions seen in prior TEE was again visualized. Contrast imaging was performed, showing no evidence of thrombus. Succesful transeptal puncture. Placement of a 31 mm  with small mitral shoulder small anterior gutter but with no peri-device leak and compression of ~ 26%. Post procedure there was a left to right interatrial shunt and a trivial pericardial effusion, unchanged from prior. Left atrial size was normal in size. No left atrial/left atrial appendage thrombus was detected.  Right Atrium: Right atrial size was normal in size.  Pericardium: Trivial pericardial effusion is present.  Mitral Valve: The mitral valve is abnormal. Trivial mitral valve regurgitation. No evidence of mitral valve stenosis.  Tricuspid Valve: The tricuspid valve is normal in structure. Tricuspid valve regurgitation is not demonstrated. No evidence of tricuspid stenosis.  Aortic Valve: The aortic valve is tricuspid. Aortic valve regurgitation is not visualized.  Pulmonic Valve: The pulmonic valve was normal in structure. Pulmonic valve regurgitation is not visualized.  Aorta: The aortic root and ascending aorta are structurally normal, with no evidence of dilitation.  IAS/Shunts: Evidence of atrial level shunting detected by color flow Doppler.  Additional Comments: Spectral Doppler performed.  Riley Lam MD Electronically signed by Riley Lam MD Signature Date/Time: 07/11/2022/11:48:40 AM    Final   MONITORS  LONG TERM MONITOR (3-14 DAYS) 08/14/2020  Narrative The predominant rhythm is sinus rhythm The patient had a min HR of 50 bpm, max HR of 152 bpm, and avg HR of 70 bpm.  Atrial fibrillation is observed (1% burden) with longest episode of 43 minutes and 45 seconds. Heart rates during afib range from 62-144 bpm (avg of  95 bpm).  Occasional premature atrial contractions and premature ventricular contractions are observed Episodes of atrial tachycardia vs atypical atrial flutter are also noted.            EKG:  EKG is *** ordered today.  The ekg ordered today demonstrates ***  Recent Labs: 09/19/2022: BUN 10; Creatinine, Ser 0.90; Hemoglobin 15.1; Platelets 232; Potassium 4.3; Sodium 138  Recent Lipid Panel    Component Value Date/Time   CHOL 155 09/25/2020 1659   TRIG 117 09/25/2020 1659   HDL 44 09/25/2020 1659   CHOLHDL 3.5 09/25/2020 1659   VLDL 23 09/25/2020 1659   LDLCALC 88 09/25/2020 1659     Risk Assessment/Calculations:   {Does this patient have ATRIAL FIBRILLATION?[phone]}   CHA2DS2-VASc Score =   [ ] .  Therefore, the patient's annual risk of stroke is   %.        HAS-BLED score *** Hypertension (Uncontrolled in 30 days)  {YES/NO:21197} Abnormal renal and liver function (Dialysis, transplant, Cr >2.26 mg/dL /Cirrhosis or Bilirubin >2x Normal or AST/ALT/AP >3x Normal) {YES/NO:21197} Stroke {YES/NO:21197} Bleeding {YES/NO:21197} Labile INR (Unstable/high INR) {YES/NO:21197} Elderly (>65) {YES/NO:21197} Drugs or alcohol (? 8 drinks/week, anti-plt or NSAID) {YES/NO:21197}   Physical Exam:    VS:  There were no vitals taken for this visit.    Wt Readings from Last 3 Encounters:  10/16/22 224 lb (101.6 kg)  09/19/22 220 lb (99.8 kg)  09/07/22 220 lb (99.8 kg)     GEN: *** Well nourished, well developed in no acute distress HEENT: Normal NECK: No JVD; No carotid bruits LYMPHATICS: No lymphadenopathy CARDIAC: ***RRR, no murmurs, rubs, gallops RESPIRATORY:  Clear to auscultation without rales, wheezing or rhonchi  ABDOMEN: Soft, non-tender, non-distended MUSCULOSKELETAL:  No edema; No deformity  SKIN: Warm and dry NEUROLOGIC:  Alert and oriented x 3 PSYCHIATRIC:  Normal affect   ASSESSMENT:    No diagnosis found. PLAN:    In order of problems listed  above:       {Are you ordering  a CV Procedure (e.g. stress test, cath, DCCV, TEE, etc)?   Press F2        :[id number]}    Medication Adjustments/Labs and Tests Ordered: Current medicines are reviewed at length with the patient today.  Concerns regarding medicines are outlined above.  No orders of the defined types were placed in this encounter.  No orders of the defined types were placed in this encounter.   There are no Patient Instructions on file for this visit.   Signed, Georgie Chard, NP  01/07/2023 12:55 PM    [facility]

## 2023-01-08 ENCOUNTER — Ambulatory Visit: Payer: Medicare Other | Attending: Cardiology | Admitting: Cardiology

## 2023-01-08 VITALS — BP 138/62 | HR 64 | Ht 72.0 in | Wt 222.0 lb

## 2023-01-08 DIAGNOSIS — Z95818 Presence of other cardiac implants and grafts: Secondary | ICD-10-CM

## 2023-01-08 DIAGNOSIS — I480 Paroxysmal atrial fibrillation: Secondary | ICD-10-CM

## 2023-01-08 NOTE — Patient Instructions (Signed)
Medication Instructions:  Your physician has recommended you make the following change in your medication:  STOP PLAVIX  *If you need a refill on your cardiac medications before your next appointment, please call your pharmacy*   Lab Work: NONE If you have labs (blood work) drawn today and your tests are completely normal, you will receive your results only by: MyChart Message (if you have MyChart) OR A paper copy in the mail If you have any lab test that is abnormal or we need to change your treatment, we will call you to review the results.   Testing/Procedures: NONE   Follow-Up: At [facility], you and your health needs are our priority.  As part of our continuing mission to provide you with exceptional heart care, we have created designated Provider Care Teams.  These Care Teams include your primary Cardiologist (physician) and Advanced Practice Providers (APPs -  Physician Assistants and Nurse Practitioners) who all work together to provide you with the care you need, when you need it.  We recommend signing up for the patient portal called "MyChart".  Sign up information is provided on this After Visit Summary.  MyChart is used to connect with patients for Virtual Visits (Telemedicine).  Patients are able to view lab/test results, encounter notes, upcoming appointments, etc.  Non-urgent messages can be sent to your provider as well.   To learn more about what you can do with MyChart, go to [email or web address].    Your next appointment:   KEEP SCHEDULED FOLLOW-UP 

## 2023-07-18 NOTE — Progress Notes (Deleted)
[facility]                                     Cardiology Office Note:    Date:  07/18/2023   ID:  Walter [name], DOB 10-Sep-1955, MRN [id number]  PCP:  Walter [name].Walter Saucer, MD (Inactive)  [facility] Cardiologist:  None  [facility] Electrophysiologist:  Walter Prude, MD   Referring MD: Walter [name], Walter Ewings.Walter Saucer, MD   No chief complaint on file. ***  History of Present Illness:    Walter [name] is a 68 y.o. male with a hx of HTN, paroxysmal atrial fib/flutter s/p ablation, DVT/PE, spontaneous intracranial hemorrhage 08/2020, and cardio embolic CVA who is here today for 6 month post Watchman follow up.    Walter [name] was diagnosed with AF 08/2020. He presented to [facility] 09/25/20 after awaking from sleep with slurred speech and HA. On ED arrival he was intubated due to lethargy. Emergent head CT was performed which showed a left midbrain hemorrhage involving the dorsal part of the pons as well with extension into the cerebral aqueduct and fourth ventricle with local mass-effect on the brainstem but no hydrocephalus. CT angiogram of the head and neck showed no large vessel occlusion or AV malformation. He was started on hypertonic saline and had repeat CT scan which showed worsening hydrocephalus and neurosurgery was consulted who placed a right frontal ventriculostomy catheter. MRI scan of the brain was obtained which confirmed acute hemorrhage within the left dorsal midbrain and pons extending to inferior medial thalamus with intraventricular extension. After EVD placement, he showed neurological improvement. Hospital course was c/b a DVT and PE requiring anticoagulation. He also required a tracheostomy and IVC filter which has since been removed.    He was transferred to to inpatient rehab on 10/26/21 where he stayed 4 weeks and went home 11/23/20. In last follow up with Walter [name] 09/2021, he was felt to be a good candidate for Watchman implant. He was referred for LAAO by Walter [name]  and seen by Dr. Lalla [name]. He was scheduled for LAAO closure 02/28/22. Unfortunately, procedure attempted however case was aborted after echodensity seen on TEE with concern for possible LAAO thrombus. He was restarted on Eliquis with initial plan to follow with a repeat TEE scheduled for 10/4. This was later cancelled due to patient concern of swallowing issues. CT not felt to give great quality images based on the size of the echodensity. Plan changed to re attempt LAAO with a TEE on the table prior to the procedure.    He is now s/p successful LAAO closure with Watchman FLX 31mm device and has been doing very well. He is here with his wife and reports no issues with chest pain, SOB, palpitations, LE edema, orthopnea, PND, dizziness, or syncope. Denies bleeding in stool or urine. He did suffer a fall several months back which resulted in a broken arm. He had surgery and has healed well.    PAF with history of intracranial hemorrhage: He is now s/p successful LAAO closure with Watchman FLX 31mm 07/11/22. He was restarted on Eliquis then transitioned to Plavix to complete 6 months of therapy. Post procedure CT with no device leak or thrombus. He may now stop Plavix and no longer requires dental SBE. We will see him back of one year follow up 07/2023.      Past Medical History:  Diagnosis Date   Atrial  flutter [facility])    s/p ablation   DVT (deep venous thrombosis) (HCC)    Near syncope 09/25/2013   Paroxysmal atrial fibrillation (HCC)    Presence of Watchman left atrial appendage closure device 07/11/2022   s/p LAAO with Watchman with a 31 mm Watchman Flex device by Dr. Lalla [name].   Stroke (HCC) 09/25/2020   initially hemorragic (not on [facility]) followed by subsequent embolic stroke    Past Surgical History:  Procedure Laterality Date   A-FLUTTER ABLATION N/A 03/09/2020   Procedure: A-FLUTTER ABLATION;  Surgeon: Walter Range, MD;  [facility] INVASIVE CV LAB;  Service: Cardiovascular;  Laterality: N/A;    CARDIOVERSION N/A 09/27/2013   Procedure: CARDIOVERSION;  Surgeon: Walter Bunting, MD;  Location: [facility] ENDOSCOPY;  Service: Cardiovascular;  Laterality: N/A;   IR ANGIO EXTERNAL CAROTID SEL EXT CAROTID UNI R MOD SED  09/26/2020   IR ANGIO INTRA EXTRACRAN SEL COM CAROTID INNOMINATE BILAT MOD SED  09/26/2020   IR ANGIO VERTEBRAL SEL VERTEBRAL UNI R MOD SED  09/26/2020   IR IVC FILTER PLMT / S&I /IMG GUID/MOD SED  10/08/2020   IR IVC FILTER RETRIEVAL / S&I /IMG GUID/MOD SED  09/28/2021   IR IVUS EACH ADDITIONAL NON CORONARY VESSEL  11/02/2020   IR PTA VENOUS EXCEPT DIALYSIS CIRCUIT  11/02/2020   IR RADIOLOGIST EVAL & MGMT  01/17/2021   IR RADIOLOGIST EVAL & MGMT  09/12/2021   IR THROMBECT VENO MECH MOD SED  10/27/2020   IR THROMBECT VENO MECH MOD SED  11/02/2020   IR US GUIDE VASC ACCESS LEFT  10/27/2020   IR US GUIDE VASC ACCESS RIGHT  09/26/2020   IR US GUIDE VASC ACCESS RIGHT  11/02/2020   IR VENO/EXT/UNI RIGHT  11/02/2020   IR VENOCAVAGRAM IVC  10/27/2020   LEFT ATRIAL APPENDAGE OCCLUSION N/A 02/28/2022   Procedure: LEFT ATRIAL APPENDAGE OCCLUSION;  Surgeon: Walter Prude, MD;  [facility]: [facility] INVAS[facility] LAB;  Service: Cardiovascular;  Laterality: N/A;   LEFT ATRIAL APPENDAGE OCCLUSION N/A 07/11/2022   Procedure: LEFT ATRIAL APPENDAGE OCCLUSION;  Surgeon: Walter Prude, MD;  Location: [facility] INVAS[facility] CV LAB;  Service: Cardiovascular;  Laterality: N/A;   OPEN REDUCTION INTERNAL FIXATION (ORIF) DISTAL RADIAL FRACTURE Left 09/19/2022   Procedure: OPEN REDUCTION INTERNAL FIXATION (ORIF) LEFT RADIUS FRACTURE;  Surgeon: Walter Loa, MD;  Location: [facility] OR;  Service: Orthopedics;  Laterality: Left;  75 MIN   RETINAL DETACHMENT SURGERY     right knee arthroscopy     TEE WITHOUT CARDIOVERSION N/A 09/27/2013   Procedure: TRANSESOPHAGEAL ECHOCARDIOGRAM (TEE);  Surgeon: Walter Bunting, MD;  Location: [facility] ENDOSCOPY;  Service: Cardiovascular;  Laterality: N/A;   TEE WITHOUT CARDIOVERSION N/A 02/28/2022   Procedure:  TRANSESOPHAGEAL ECHOCARDIOGRAM (TEE);  Surgeon: Walter Prude, MD;  Location: [facility] INVASIVE CV LAB;  Service: Cardiovascular;  Laterality: N/A;   TEE WITHOUT CARDIOVERSION N/A 07/11/2022   Procedure: TRANSESOPHAGEAL ECHOCARDIOGRAM (TEE);  Surgeon: Walter Prude, MD;  Location: [facility] INVASIVE CV LAB;  Service: Cardiovascular;  Laterality: N/A;   WRIST SURGERY      Current Medications: No outpatient medications have been marked as taking for the 07/21/23 encounter (Appointment) with [facility].     Allergies:   Keppra [levetiracetam], Gabapentin, Latex, and Pregabalin   Social History   Socioeconomic History   Marital status: Married    Spouse name: cathy   Number of children: Not on file   Years of education: Not on file   Highest  education level: Not on file  Occupational History   Not on file  Tobacco Use   Smoking status: Never   Smokeless tobacco: Never  Vaping Use   Vaping status: Never Used  Substance and Sexual Activity   Alcohol use: Yes    Comment: occasionally   Drug use: No   Sexual activity: Not on file  Other Topics Concern   Not on file  Social History Narrative   Lives with wife at home   Right Handed   Drinks decaf   Social Drivers of Health   Financial Resource Strain: Not on file  Food Insecurity: Unknown (10/02/2022)   Hunger Vital Sign    Worried About Running Out of Food in the Last Year: Never true    Ran Out of Food in the Last Year: Not on file  Transportation Needs: No Transportation Needs (10/02/2022)   PRAPARE - Administrator, Civil Service (Medical): No    Lack of Transportation (Non-Medical): No  Physical Activity: Not on file  Stress: Not on file  Social Connections: Not on file     Family History: The patient's ***family history includes Healthy in his brother and sister; Heart disease in his father and mother.  ROS:   Please see the history of present illness.    All other systems reviewed and  are negative.  EKGs/Labs/Other Studies Reviewed:    The following studies were reviewed today:   Cardiac Studies & Procedures       ECHOCARDIOGRAM  ECHOCARDIOGRAM COMPLETE 10/19/2021  Narrative ECHOCARDIOGRAM REPORT    Patient Name:   ASHUTOSH SIRIGNANO Date of Exam: 10/19/2021 Medical Rec #:  [id number]       Height:       72.0 in Accession #:    [id number]      Weight:       229.6 lb Date of Birth:  28-Oct-1955       BSA:          2.259 m Patient Age:    66 years        BP:           128/78 mmHg Patient Gender: M               HR:           71 bpm. Exam Location:  [address]  Procedure: 2D Echo, Cardiac Doppler and Color Doppler  Indications:    I48.0 Paroxysmal atrial fibrillation  History:        Patient has prior history of Echocardiogram examinations, most recent 09/25/2020. Stroke, Pre-op evaluation, Arrythmias:Atrial Fibrillation; Risk Factors:Hypertension.  Sonographer:    Samule Ohm RDCS Referring Phys: [id number] Rossie Muskrat LAMBERT  IMPRESSIONS   1. Left ventricular ejection fraction, by estimation, is 60 to 65%. The left ventricle has normal function. The left ventricle has no regional wall motion abnormalities. There is moderate left ventricular hypertrophy. Left ventricular diastolic parameters are consistent with Grade I diastolic dysfunction (impaired relaxation). 2. Right ventricular systolic function is normal. The right ventricular size is normal. Tricuspid regurgitation signal is inadequate for assessing PA pressure. 3. The mitral valve is abnormal. Mild mitral valve regurgitation. 4. The aortic valve was not well visualized. Aortic valve regurgitation is not visualized. 5. The inferior vena cava is normal in size with greater than 50% respiratory variability, suggesting right atrial pressure of 3 mmHg.  Comparison(s): Changes from prior study are noted. 09/25/2020: LVEF 55-60%.  FINDINGS Left Ventricle: Left ventricular ejection  fraction, by  estimation, is 60 to 65%. The left ventricle has normal function. The left ventricle has no regional wall motion abnormalities. The left ventricular internal cavity size was normal in size. There is moderate left ventricular hypertrophy. Left ventricular diastolic parameters are consistent with Grade I diastolic dysfunction (impaired relaxation). Indeterminate filling pressures.  Right Ventricle: The right ventricular size is normal. No increase in right ventricular wall thickness. Right ventricular systolic function is normal. Tricuspid regurgitation signal is inadequate for assessing PA pressure.  Left Atrium: Left atrial size was normal in size.  Right Atrium: Right atrial size was normal in size.  Pericardium: There is no evidence of pericardial effusion.  Mitral Valve: The mitral valve is abnormal. Mild mitral valve regurgitation.  Tricuspid Valve: The tricuspid valve is grossly normal. Tricuspid valve regurgitation is trivial.  Aortic Valve: The aortic valve was not well visualized. Aortic valve regurgitation is not visualized.  Pulmonic Valve: The pulmonic valve was not well visualized. Pulmonic valve regurgitation is not visualized.  Aorta: The aortic root and ascending aorta are structurally normal, with no evidence of dilitation.  Venous: The inferior vena cava is normal in size with greater than 50% respiratory variability, suggesting right atrial pressure of 3 mmHg.  IAS/Shunts: No atrial level shunt detected by color flow Doppler.   LEFT VENTRICLE PLAX 2D LVIDd:         4.80 cm   Diastology LVIDs:         3.30 cm   LV e' medial:    8.70 cm/s LV PW:         1.30 cm   LV E/e' medial:  9.9 LV IVS:        1.30 cm   LV e' lateral:   11.70 cm/s LVOT diam:     2.20 cm   LV E/e' lateral: 7.4 LV SV:         76 LV SV Index:   34 LVOT Area:     3.80 cm   RIGHT VENTRICLE             IVC RV S prime:     20.20 cm/s  IVC diam: 1.40 cm TAPSE (M-mode): 2.5 cm  LEFT ATRIUM              Index        RIGHT ATRIUM           Index LA diam:        4.40 cm 1.95 cm/m   RA Pressure: 3.00 mmHg LA Vol (A2C):   65.1 ml 28.81 ml/m  RA Area:     17.40 cm LA Vol (A4C):   63.4 ml 28.06 ml/m  RA Volume:   42.80 ml  18.94 ml/m LA Biplane Vol: 64.2 ml 28.42 ml/m AORTIC VALVE LVOT Vmax:   96.60 cm/s LVOT Vmean:  66.300 cm/s LVOT VTI:    0.200 m  AORTA Ao Root diam: 3.90 cm Ao Asc diam:  3.60 cm  MITRAL VALVE               TRICUSPID VALVE MV Area (PHT): 3.63 cm    Estimated RAP:  3.00 mmHg MV Decel Time: 209 msec MV E velocity: 86.50 cm/s  SHUNTS MV A velocity: 72.30 cm/s  Systemic VTI:  0.20 m MV E/A ratio:  1.20        Systemic Diam: 2.20 cm  Zoila Shutter MD Electronically signed by Zoila Shutter MD Signature Date/Time: 10/19/2021/4:55:14 PM    Final  TEE  ECHO TEE 07/11/2022  Narrative TRANSESOPHOGEAL ECHO REPORT    Patient Name:   BECKHEM DAMERY Date of Exam: 07/11/2022 Medical Rec #:  [id number]       Height:       72.0 in Accession #:    [id number]      Weight:       225.0 lb Date of Birth:  Jun 14, 1956       BSA:          2.240 m Patient Age:    66 years        BP:           144/85 mmHg Patient Gender: M               HR:           68 bpm. Exam Location:  Inpatient  Procedure: Transesophageal Echo, 3D Echo, Color Doppler, Cardiac Doppler and Intracardiac Opacification Agent  Indications:     I48.1 Persistent atrial fibrillation  History:         Patient has prior history of Echocardiogram examinations, most recent 02/28/2022. Arrythmias:Atrial Fibrillation and Atrial Flutter; Risk Factors:Hypertension.  Sonographer:     Irving Burton Senior RDCS Referring Phys:  [id number] Walter [name] Diagnosing Phys: [name] Lam MD   Sonographer Comments: Watchman Procedure   PROCEDURE: After discussion of the risks and benefits of a TEE, an informed consent was obtained from the patient. The transesophogeal probe was passed without difficulty  through the esophogus of the patient. Sedation performed by different physician. The patient was monitored while under deep sedation. The patient developed no complications during the procedure.  IMPRESSIONS   1. Interventional TEE for LAA-O placement. Prior to procedure, patent left atrial appendage. Maximal dimension 2.5 cm suitable for a 31 mm FLX device. Prior to transeptal puncture, the filamentous lesions seen in prior TEE was again visualized. Contrast imaging was performed, showing no evidence of thrombus. Succesful transeptal puncture. Placement of a 31 mm with small mitral shoulder small anterior gutter but with no peri-device leak and compression of ~ 26%. Post procedure there was a left to right interatrial shunt and a trivial pericardial effusion, unchanged from prior.. No left atrial/left atrial appendage thrombus was detected. 2. Left ventricular ejection fraction, by estimation, is 60 to 65%. The left ventricle has normal function. 3. Right ventricular systolic function is normal. The right ventricular size is normal. 4. The mitral valve is abnormal. Trivial mitral valve regurgitation. No evidence of mitral stenosis. 5. The aortic valve is tricuspid. Aortic valve regurgitation is not visualized. 6. Evidence of atrial level shunting detected by color flow Doppler.  Comparison(s): No change from prior.  FINDINGS Left Ventricle: Left ventricular ejection fraction, by estimation, is 60 to 65%. The left ventricle has normal function. Definity contrast agent was given IV to delineate the left ventricular endocardial borders. The left ventricular internal cavity size was normal in size.  Right Ventricle: The right ventricular size is normal. Right vetricular wall thickness was not well visualized. Right ventricular systolic function is normal.  Left Atrium: Interventional TEE for LAA-O placement. Prior to procedure, patent left atrial appendage. Maximal dimension 2.5 cm suitable for a  31 mm FLX device. Prior to transeptal puncture, the filamentous lesions seen in prior TEE was again visualized. Contrast imaging was performed, showing no evidence of thrombus. Succesful transeptal puncture. Placement of a 31 mm with small mitral shoulder small anterior gutter but with no peri-device leak and compression of ~ 26%. Post  procedure there was a left to right interatrial shunt and a trivial pericardial effusion, unchanged from prior. Left atrial size was normal in size. No left atrial/left atrial appendage thrombus was detected.  Right Atrium: Right atrial size was normal in size.  Pericardium: Trivial pericardial effusion is present.  Mitral Valve: The mitral valve is abnormal. Trivial mitral valve regurgitation. No evidence of mitral valve stenosis.  Tricuspid Valve: The tricuspid valve is normal in structure. Tricuspid valve regurgitation is not demonstrated. No evidence of tricuspid stenosis.  Aortic Valve: The aortic valve is tricuspid. Aortic valve regurgitation is not visualized.  Pulmonic Valve: The pulmonic valve was normal in structure. Pulmonic valve regurgitation is not visualized.  Aorta: The aortic root and ascending aorta are structurally normal, with no evidence of dilitation.  IAS/Shunts: Evidence of atrial level shunting detected by color flow Doppler.  Additional Comments: Spectral Doppler performed.  [name] Lam MD Electronically signed by [name] Lam MD Signature Date/Time: 07/11/2022/11:48:40 AM    Final   MONITORS  LONG TERM MONITOR (3-14 DAYS) 08/10/2020  Narrative The predominant rhythm is sinus rhythm The patient had a min HR of 50 bpm, max HR of 152 bpm, and avg HR of 70 bpm.  Atrial fibrillation is observed (1% burden) with longest episode of 43 minutes and 45 seconds. Heart rates during afib [name] from 62-144 bpm (avg of 95 bpm).  Occasional premature atrial contractions and premature ventricular contractions are  observed Episodes of atrial tachycardia vs atypical atrial flutter are also noted.            EKG:  EKG is *** ordered today.  The ekg ordered today demonstrates ***  Recent Labs: 09/19/2022: BUN 10; Creatinine, Ser 0.90; Hemoglobin 15.1; Platelets 232; Potassium 4.3; Sodium 138  Recent Lipid Panel    Component Value Date/Time   CHOL 155 09/25/2020 1659   TRIG 117 09/25/2020 1659   HDL 44 09/25/2020 1659   CHOLHDL 3.5 09/25/2020 1659   VLDL 23 09/25/2020 1659   LDLCALC 88 09/25/2020 1659     Risk Assessment/Calculations:   {Does this patient have ATRIAL FIBRILLATION?[phone]}   CHA2DS2-VASc Score =   [ ] .  Therefore, the patient's annual risk of stroke is   %.        HAS-BLED score *** Hypertension (Uncontrolled in 30 days)  {YES/NO:21197} Abnormal renal and liver function (Dialysis, transplant, Cr >2.26 mg/dL /Cirrhosis or Bilirubin >2x Normal or AST/ALT/AP >3x Normal) {YES/NO:21197} Stroke {YES/NO:21197} Bleeding {YES/NO:21197} Labile INR (Unstable/high INR) {YES/NO:21197} Elderly (>65) {YES/NO:21197} Drugs or alcohol (>= 8 drinks/week, anti-plt or NSAID) {YES/NO:21197}   Physical Exam:    VS:  There were no vitals taken for this visit.    Wt Readings from Last 3 Encounters:  01/08/23 222 lb (100.7 kg)  10/16/22 224 lb (101.6 kg)  09/19/22 220 lb (99.8 kg)     GEN: *** Well nourished, well developed in no acute distress HEENT: Normal NECK: No JVD; No carotid bruits LYMPHATICS: No lymphadenopathy CARDIAC: ***RRR, no murmurs, rubs, gallops RESPIRATORY:  Clear to auscultation without rales, wheezing or rhonchi  ABDOMEN: Soft, non-tender, non-distended MUSCULOSKELETAL:  No edema; No deformity  SKIN: Warm and dry NEUROLOGIC:  Alert and oriented x 3 PSYCHIATRIC:  Normal affect   ASSESSMENT:    No diagnosis found. PLAN:    In order of problems listed above:     I spent *** minutes caring for this patient today including face-to-face discussions,  ordering and reviewing labs, reviewing records from [facility] and other  outside facilities, documenting in the record, and arranging for follow up.       {Are you ordering a CV Procedure (e.g. stress test, cath, DCCV, TEE, etc)?   Press F2        :[id number]}    Medication Adjustments/Labs and Tests Ordered: Current medicines are reviewed at length with the patient today.  Concerns regarding medicines are outlined above.  No orders of the defined types were placed in this encounter.  No orders of the defined types were placed in this encounter.   There are no Patient Instructions on file for this visit.   Signed, Georgie Chard, NP  07/18/2023 9:48 AM    [facility]

## 2023-07-21 ENCOUNTER — Telehealth: Payer: Self-pay | Admitting: Cardiology

## 2023-07-21 ENCOUNTER — Ambulatory Visit: Payer: Medicare Other

## 2023-07-21 ENCOUNTER — Telehealth: Payer: Self-pay | Admitting: Internal Medicine

## 2023-07-21 NOTE — Telephone Encounter (Signed)
Dr. Ladona Ridgel will sign the death certificate.  Beth aware.

## 2023-07-21 NOTE — Telephone Encounter (Signed)
Caller [facility]) stated patient passed away on 07-21-23 at 10:25 am and she wants to know if Dr. Ladona Ridgel will sign patient's death certificate prior to scheduling patient's cremation on 1/23.

## 2023-07-21 NOTE — Telephone Encounter (Signed)
Patient contacted today in regards to missed appointment for 1 year post LAAO with no answer. Sent patient a MyChart message as well. Appears he is due for regular EP follow up as well. Plan to schedule this upon return call.   Georgie Chard NP-C Structural Heart Team  Phone: [phone]

## 2023-07-21 NOTE — Telephone Encounter (Signed)
Noreene Larsson aware patient passed away 17-Aug-2023.  I have removed all recalls and canceled todays appointment.

## 2023-08-02 DEATH — deceased
